# Patient Record
Sex: Female | Born: 1955
Health system: Southern US, Community
[De-identification: ages and names within clinical notes are randomized; demographics above are authoritative.]

## PROBLEM LIST (undated history)

## (undated) DIAGNOSIS — M549 Dorsalgia, unspecified: Secondary | ICD-10-CM

## (undated) DIAGNOSIS — E785 Hyperlipidemia, unspecified: Secondary | ICD-10-CM

## (undated) DIAGNOSIS — M542 Cervicalgia: Secondary | ICD-10-CM

## (undated) DIAGNOSIS — M1990 Unspecified osteoarthritis, unspecified site: Secondary | ICD-10-CM

## (undated) DIAGNOSIS — D649 Anemia, unspecified: Secondary | ICD-10-CM

## (undated) DIAGNOSIS — K449 Diaphragmatic hernia without obstruction or gangrene: Secondary | ICD-10-CM

## (undated) DIAGNOSIS — R0602 Shortness of breath: Secondary | ICD-10-CM

## (undated) DIAGNOSIS — R1310 Dysphagia, unspecified: Secondary | ICD-10-CM

## (undated) DIAGNOSIS — R12 Heartburn: Secondary | ICD-10-CM

## (undated) DIAGNOSIS — E119 Type 2 diabetes mellitus without complications: Secondary | ICD-10-CM

## (undated) DIAGNOSIS — J329 Chronic sinusitis, unspecified: Secondary | ICD-10-CM

## (undated) DIAGNOSIS — R5383 Other fatigue: Secondary | ICD-10-CM

## (undated) DIAGNOSIS — F419 Anxiety disorder, unspecified: Secondary | ICD-10-CM

## (undated) DIAGNOSIS — K219 Gastro-esophageal reflux disease without esophagitis: Secondary | ICD-10-CM

## (undated) DIAGNOSIS — E039 Hypothyroidism, unspecified: Secondary | ICD-10-CM

## (undated) DIAGNOSIS — G8929 Other chronic pain: Secondary | ICD-10-CM

## (undated) DIAGNOSIS — I10 Essential (primary) hypertension: Secondary | ICD-10-CM

## (undated) DIAGNOSIS — Z6841 Body Mass Index (BMI) 40.0 and over, adult: Secondary | ICD-10-CM

## (undated) DIAGNOSIS — Z8719 Personal history of other diseases of the digestive system: Secondary | ICD-10-CM

## (undated) DIAGNOSIS — R0600 Dyspnea, unspecified: Secondary | ICD-10-CM

## (undated) DIAGNOSIS — M2550 Pain in unspecified joint: Secondary | ICD-10-CM

## (undated) DIAGNOSIS — F329 Major depressive disorder, single episode, unspecified: Secondary | ICD-10-CM

## (undated) DIAGNOSIS — F32A Depression, unspecified: Secondary | ICD-10-CM

## (undated) DIAGNOSIS — K222 Esophageal obstruction: Secondary | ICD-10-CM

## (undated) DIAGNOSIS — R600 Localized edema: Secondary | ICD-10-CM

## (undated) DIAGNOSIS — M797 Fibromyalgia: Secondary | ICD-10-CM

## (undated) DIAGNOSIS — G4730 Sleep apnea, unspecified: Secondary | ICD-10-CM

## (undated) DIAGNOSIS — J45909 Unspecified asthma, uncomplicated: Secondary | ICD-10-CM

## (undated) DIAGNOSIS — K635 Polyp of colon: Secondary | ICD-10-CM

## (undated) HISTORY — DX: Dysphagia, unspecified: R13.10

## (undated) HISTORY — DX: Other fatigue: R53.83

## (undated) HISTORY — DX: Morbid (severe) obesity due to excess calories: E66.01

## (undated) HISTORY — DX: Shortness of breath: R06.02

## (undated) HISTORY — DX: Heartburn: R12

## (undated) HISTORY — PX: VAGINAL PROLAPSE REPAIR: SHX830

## (undated) HISTORY — DX: Anxiety disorder, unspecified: F41.9

## (undated) HISTORY — DX: Dorsalgia, unspecified: M54.9

## (undated) HISTORY — DX: Diaphragmatic hernia without obstruction or gangrene: K44.9

## (undated) HISTORY — PX: RECTOCELE REPAIR: SHX761

## (undated) HISTORY — DX: Chronic sinusitis, unspecified: J32.9

## (undated) HISTORY — DX: Unspecified asthma, uncomplicated: J45.909

## (undated) HISTORY — DX: Cervicalgia: M54.2

## (undated) HISTORY — DX: Other chronic pain: G89.29

## (undated) HISTORY — DX: Anemia, unspecified: D64.9

## (undated) HISTORY — DX: Polyp of colon: K63.5

## (undated) HISTORY — DX: Unspecified osteoarthritis, unspecified site: M19.90

## (undated) HISTORY — DX: Localized edema: R60.0

## (undated) HISTORY — DX: Fibromyalgia: M79.7

## (undated) HISTORY — DX: Essential (primary) hypertension: I10

## (undated) HISTORY — DX: Gastro-esophageal reflux disease without esophagitis: K21.9

## (undated) HISTORY — DX: Body Mass Index (BMI) 40.0 and over, adult: Z684

## (undated) HISTORY — DX: Pain in unspecified joint: M25.50

## (undated) HISTORY — DX: Esophageal obstruction: K22.2

## (undated) HISTORY — DX: Hyperlipidemia, unspecified: E78.5

## (undated) HISTORY — DX: Personal history of other diseases of the digestive system: Z87.19

## (undated) HISTORY — PX: ABDOMINAL HYSTERECTOMY: SHX81

---

## 2000-02-18 HISTORY — PX: ABDOMINAL HYSTERECTOMY: SHX81

## 2001-04-27 HISTORY — PX: KNEE ARTHROSCOPY: SUR90

## 2001-10-05 HISTORY — PX: TRANSTHORACIC ECHOCARDIOGRAM: SHX275

## 2001-10-05 HISTORY — PX: NM MYOCAR PERF WALL MOTION: HXRAD629

## 2002-08-17 ENCOUNTER — Ambulatory Visit (HOSPITAL_COMMUNITY): Admission: RE | Admit: 2002-08-17 | Discharge: 2002-08-17 | Payer: Self-pay | Admitting: Cardiovascular Disease

## 2002-08-17 ENCOUNTER — Encounter: Payer: Self-pay | Admitting: Cardiovascular Disease

## 2003-08-18 HISTORY — PX: CARDIAC CATHETERIZATION: SHX172

## 2004-02-07 ENCOUNTER — Ambulatory Visit (HOSPITAL_COMMUNITY): Admission: RE | Admit: 2004-02-07 | Discharge: 2004-02-07 | Payer: Self-pay | Admitting: Family Medicine

## 2004-02-28 ENCOUNTER — Ambulatory Visit (HOSPITAL_COMMUNITY): Admission: RE | Admit: 2004-02-28 | Discharge: 2004-02-28 | Payer: Self-pay | Admitting: Otolaryngology

## 2004-10-05 HISTORY — PX: COLONOSCOPY: SHX174

## 2005-04-09 ENCOUNTER — Ambulatory Visit (HOSPITAL_COMMUNITY): Admission: RE | Admit: 2005-04-09 | Discharge: 2005-04-09 | Payer: Self-pay | Admitting: Family Medicine

## 2005-05-06 ENCOUNTER — Encounter (INDEPENDENT_AMBULATORY_CARE_PROVIDER_SITE_OTHER): Payer: Self-pay | Admitting: General Surgery

## 2005-05-06 ENCOUNTER — Ambulatory Visit (HOSPITAL_COMMUNITY): Admission: RE | Admit: 2005-05-06 | Discharge: 2005-05-06 | Payer: Self-pay | Admitting: General Surgery

## 2005-07-10 ENCOUNTER — Ambulatory Visit (HOSPITAL_COMMUNITY): Admission: RE | Admit: 2005-07-10 | Discharge: 2005-07-10 | Payer: Self-pay | Admitting: Family Medicine

## 2006-04-27 ENCOUNTER — Observation Stay (HOSPITAL_COMMUNITY): Admission: EM | Admit: 2006-04-27 | Discharge: 2006-04-28 | Payer: Self-pay | Admitting: Emergency Medicine

## 2006-08-06 ENCOUNTER — Ambulatory Visit (HOSPITAL_COMMUNITY): Admission: RE | Admit: 2006-08-06 | Discharge: 2006-08-06 | Payer: Self-pay | Admitting: Internal Medicine

## 2007-01-02 ENCOUNTER — Emergency Department (HOSPITAL_COMMUNITY): Admission: EM | Admit: 2007-01-02 | Discharge: 2007-01-02 | Payer: Self-pay | Admitting: Emergency Medicine

## 2007-08-08 ENCOUNTER — Ambulatory Visit (HOSPITAL_COMMUNITY): Admission: RE | Admit: 2007-08-08 | Discharge: 2007-08-08 | Payer: Self-pay | Admitting: Family Medicine

## 2007-11-29 ENCOUNTER — Inpatient Hospital Stay (HOSPITAL_COMMUNITY): Admission: RE | Admit: 2007-11-29 | Discharge: 2007-11-30 | Payer: Self-pay | Admitting: Obstetrics and Gynecology

## 2008-06-19 ENCOUNTER — Ambulatory Visit: Payer: Self-pay | Admitting: Internal Medicine

## 2008-08-08 ENCOUNTER — Encounter: Admission: RE | Admit: 2008-08-08 | Discharge: 2008-08-08 | Payer: Self-pay | Admitting: Family Medicine

## 2008-08-22 ENCOUNTER — Ambulatory Visit: Payer: Self-pay | Admitting: Internal Medicine

## 2008-08-23 ENCOUNTER — Ambulatory Visit: Payer: Self-pay | Admitting: Internal Medicine

## 2008-08-23 ENCOUNTER — Ambulatory Visit (HOSPITAL_COMMUNITY): Admission: RE | Admit: 2008-08-23 | Discharge: 2008-08-23 | Payer: Self-pay | Admitting: Internal Medicine

## 2008-08-23 HISTORY — PX: ESOPHAGOGASTRODUODENOSCOPY: SHX1529

## 2008-09-25 ENCOUNTER — Ambulatory Visit: Payer: Self-pay | Admitting: Gastroenterology

## 2008-12-27 ENCOUNTER — Encounter: Admission: RE | Admit: 2008-12-27 | Discharge: 2008-12-27 | Payer: Self-pay | Admitting: *Deleted

## 2009-02-05 ENCOUNTER — Encounter: Payer: Self-pay | Admitting: Internal Medicine

## 2009-02-07 ENCOUNTER — Encounter: Payer: Self-pay | Admitting: Gastroenterology

## 2010-01-10 ENCOUNTER — Telehealth (INDEPENDENT_AMBULATORY_CARE_PROVIDER_SITE_OTHER): Payer: Self-pay

## 2010-01-10 ENCOUNTER — Encounter: Payer: Self-pay | Admitting: Gastroenterology

## 2010-01-21 ENCOUNTER — Encounter: Payer: Self-pay | Admitting: Gastroenterology

## 2010-01-21 ENCOUNTER — Ambulatory Visit: Payer: Self-pay | Admitting: Gastroenterology

## 2010-01-21 DIAGNOSIS — K219 Gastro-esophageal reflux disease without esophagitis: Secondary | ICD-10-CM

## 2010-01-21 DIAGNOSIS — R1319 Other dysphagia: Secondary | ICD-10-CM

## 2010-03-17 ENCOUNTER — Telehealth (INDEPENDENT_AMBULATORY_CARE_PROVIDER_SITE_OTHER): Payer: Self-pay

## 2010-07-23 ENCOUNTER — Encounter (INDEPENDENT_AMBULATORY_CARE_PROVIDER_SITE_OTHER): Payer: Self-pay | Admitting: *Deleted

## 2010-10-25 ENCOUNTER — Encounter: Payer: Self-pay | Admitting: Family Medicine

## 2010-10-29 ENCOUNTER — Ambulatory Visit (HOSPITAL_COMMUNITY)
Admission: RE | Admit: 2010-10-29 | Discharge: 2010-10-29 | Payer: Self-pay | Source: Home / Self Care | Attending: Family Medicine | Admitting: Family Medicine

## 2010-11-04 NOTE — Letter (Signed)
Summary: Internal Other /Rx fax [facility]  Internal Other /Rx fax [facility]   Imported By: Cloria Spring LPN 78/46/9629 52:84:13  _____________________________________________________________________  External Attachment:    Type:   Image     Comment:   External Document

## 2010-11-04 NOTE — Progress Notes (Signed)
Summary: phone message/ nexium not working  Phone Note Call from Patient   Caller: Patient Summary of Call: Pt called and said she is on Nexium now and it is not working well, she has alot of burning. She was doing well on Zegerid before they started giving generic. Please advise. ( [facility] in [place]) Initial call taken by: Cloria Spring LPN,  March 17, 2010 11:38 AM     Appended Document: phone message/ nexium not working    Prescriptions: DEXILANT 60 MG CPDR (DEXLANSOPRAZOLE) one by mouth 30 mins before breakfast daily  #30 x 11   Entered and Authorized by:   Leanna Battles. Dixon Boos   Signed by:   Leanna Battles Lewis PA-C on 03/17/2010   Method used:   Electronically to        [facility] [phone]* (retail)       [address]       [place], [place]  [address]       Ph: [phone]       Fax: [phone]   RxID:   [id number]    Change to Dexilant. Please make sure she knows it is only taken once daily because it is "true" 24 hour med with release of medication twice during the day.  Most people do well with only once daily dosing.    Appended Document: phone message/ nexium not working Pt informed.

## 2010-11-04 NOTE — Letter (Signed)
Summary: Recall Office Visit  [facility]  [address]   [place], [place] [address]   Phone: [phone]  Fax: [phone]      July 23, 2010   [facility] [address] [place], [place]  [address] 1956/02/15   Dear Ms. Saxe,   According to our records, it is time for you to schedule a follow-up office visit with [name].   At your convenience, please call [phone] to schedule an office visit. If you have any questions, concerns, or feel that this letter is in error, we would appreciate your call.   Sincerely,    Diana Eves  [facility] Ph: [phone]   Fax: [phone]

## 2010-11-04 NOTE — Medication Information (Signed)
Summary: Tax adviser   Imported By: Diana Eves 01/10/2010 08:52:13  _____________________________________________________________________  External Attachment:    Type:   Image     Comment:   External Document  Appended Document: RX Folder-ZEGERID    Prescriptions: ZEGERID 40-1100 MG CAPS (OMEPRAZOLE-SODIUM BICARBONATE) one by mouth bid  #60 x 5   Entered and Authorized by:   Leanna Battles. Dixon Boos   Signed by:   Leanna Battles Dixon Boos on 01/10/2010   Method used:   Electronically to        [facility] [phone]* (retail)       [address]       [place], [place]  [address]       Ph: [phone]       Fax: [phone]   RxID:   [id number]

## 2010-11-04 NOTE — Progress Notes (Signed)
Summary: Pt requesting refills on Zegerid/ Please see Rx folder  Phone Note Call from Patient   Caller: Patient Summary of Call: Pt called about her Zegerid. [facility]red [facility] to [facility] [address] in [place].(Told her the Rx request is in refill box) Initial call taken by: Cloria Spring LPN,  January 10, 2010 11:11 AM

## 2010-11-04 NOTE — Assessment & Plan Note (Signed)
Summary: e30,abd pain.gu   Visit Type:  Follow-up Visit Primary Care Provider:  Phillips Odor  Chief Complaint:  Abdominal pain.  History of Present Illness: [name] is a pleasant 55 year old female who presents today for further evaluation of abdominal pain. Last saw her in December 2009. She has a history of erosive reflux esophagitis. Her last EGD was in November 2009. She states that she was doing well up until her Zegerid was switched to generic. She noted an almost immediately difference in the control of her heartburn. It is gradually getting worse in the last several months. She has more epigastric burning, bloating, heartburn. Now she feels like food is sticking when she swallows. At nighttime when she lies down she feels like food is going to come back up. She tries not to eat too late at night. She is watching her caffeine intake. Her bowel movements are regular. Not in any blood in the stool or melena.    Current Medications (verified): 1)  Zegerid 40-1100 Mg Caps (Omeprazole-Sodium Bicarbonate) .... One By Mouth Bid 2)  Welchol .... 625mg  Three Times A Day 3)  Estradiol .... 0.5mg  Daily 4)  Wellbutrin .Marland KitchenMarland KitchenMarland Kitchen 150 Mg Daily 5)  Levocetirizine Dihydrochloride 5 Mg Tabs (Levocetirizine Dihydrochloride) .... Once Daily 6)  Aspir-Low 81 Mg Tbec (Aspirin) .... Once Daily 7)  Co Q 10 120 Mg .... Once Daily 8)  Cyclobenzaprine Hcl 10 Mg Tabs (Cyclobenzaprine Hcl) .... Once Daily 9)  Hydrocodone-Acetaminophen 2.5-500 Mg Tabs (Hydrocodone-Acetaminophen) .... As Needed 10)  Lyrica 50 Mg Caps (Pregabalin) .... Three Times A Day 11)  Fish Oil 1000 Mg Caps (Omega-3 Fatty Acids) .... Once Daily 12)  Multivitamins  Tabs (Multiple Vitamin) .... Once Daily 13)  Vitamin D2 .... Q Week 14)  Tribenzor 40-5-12.5 Mg Tabs (Olmesartan-Amlodipine-Hctz) .... Once Daily  Allergies (verified): No Known Drug Allergies  Past History:  Past Medical History: GERD EGD, 11/09, Dr. Jena Gauss --> mild erosive  reflux esophagitis Allergies Constipation Anxiety Disorder Hyperlipidemia Hypertension TCS, 8/06, Dr. Franky Macho --> small hyperplastic polyp EGD, 8/06, Dr. Franky Macho --> hiatal hernia. CLO test positive, treated for H. Pylori Chronic neck pain, ?arthritis, fibromyalgia  Past Surgical History: Knee Arthroscopy, Left, twice Cardiac Cath Vaginal prolapse Rectocele repair  Family History: Father, kidney cancer No FH CRC, colon polyps Aunt, stomach cancer Grandmother, breast cancer  Social History: Married. Four children. Taking care of grandchild. Quit tob use 16 years ago. No alcohol. No drugs.   Review of Systems      See HPI General:  weight up 10 pounds in one year. CV:  Denies chest pains, angina, and dyspnea on exertion. Resp:  Denies dyspnea at rest, dyspnea with exercise, and cough. GU:  Denies urinary burning and blood in urine.  Vital Signs:  Patient profile:   55 year old female Height:      68 inches Weight:      288 pounds BMI:     43.95 Temp:     98.7 degrees F oral Pulse rate:   76 / minute BP sitting:   120 / 80  (left arm) Cuff size:   large  Vitals Entered By: Cloria Spring LPN (January 21, 2010 10:37 AM)  Physical Exam  General:  Well developed, well nourished, no acute distress.obese.   Head:  Normocephalic and atraumatic. Eyes:  Conjunctivae pink, no scleral icterus.  Mouth:  OP moist. Lungs:  Clear throughout to auscultation. Heart:  Regular rate and rhythm; no murmurs, rubs,  or bruits. Abdomen:  Bowel sounds normal.  Abdomen is soft, nontender, nondistended.  No rebound or guarding.  No hepatosplenomegaly, masses or hernias.  No abdominal bruits. obese.   Extremities:  No clubbing, cyanosis, edema or deformities noted. Neurologic:  Alert and  oriented x4;  grossly normal neurologically. Skin:  Intact without significant lesions or rashes. Psych:  Alert and cooperative. Normal mood and affect.  Impression & Recommendations:  Problem  # 1:  GERD (ICD-530.81)  Refractory symptoms on generic Zegerid b.i.d. Will switch her to different PPI to see if her symptoms can be controlled. Start Nexium 40 mg 30 minutes before breakfast and before evening meal daily, #30 samples and prescription provided. She will call the progress report in few weeks. Suspect mild dysphagia due to refractory GERD. OV in 6 months with Dr. Jena Gauss for f/u.  Orders: Est. Patient Level III ([address])  Patient Instructions: 1)  Stop Zegerid. 2)  Begin Nexium twice per day. 3)  Call with progress report in 3-4 weeks.  4)  Please schedule a follow-up appointment in 6 months with Dr. Jena Gauss 5)  Avoid foods high in acid content ( tomatoes, citrus juices, spicy foods) . Avoid eating within 3 to 4 hours of lying down or before exercising. Do not over eat; try smaller more frequent meals.   6)  The medication list was reviewed and reconciled.  All changed / newly prescribed medications were explained.  A complete medication list was provided to the patient / caregiver. Prescriptions: NEXIUM 40 MG CPDR (ESOMEPRAZOLE MAGNESIUM) one by mouth 30 mins before breakfast and 30 mins before evening meal  #60 x 5   Entered and Authorized by:   Leanna Battles. Dixon Boos   Signed by:   Leanna Battles Suprena Travaglini PA-C on 01/21/2010   Method used:   Printed then faxed to ...         RxID:   [id number]   Appended Document: e30,abd pain.gu reminder in computer

## 2010-12-05 ENCOUNTER — Encounter: Payer: Self-pay | Admitting: Gastroenterology

## 2010-12-16 ENCOUNTER — Ambulatory Visit (INDEPENDENT_AMBULATORY_CARE_PROVIDER_SITE_OTHER): Payer: Medicare HMO | Admitting: Gastroenterology

## 2010-12-16 ENCOUNTER — Encounter: Payer: Self-pay | Admitting: Gastroenterology

## 2010-12-16 DIAGNOSIS — R1319 Other dysphagia: Secondary | ICD-10-CM

## 2010-12-16 DIAGNOSIS — K219 Gastro-esophageal reflux disease without esophagitis: Secondary | ICD-10-CM

## 2010-12-23 NOTE — Assessment & Plan Note (Signed)
Summary: burning in stomach   Vital Signs:  Patient profile:   55 year old female Height:      68.5 inches Weight:      289 pounds BMI:     43.46 Temp:     98.3 degrees F oral Pulse rate:   64 / minute BP sitting:   126 / 84  (left arm)  Vitals Entered By: Carolan Clines LPN (December 16, 2010 9:57 AM)  Visit Type:  f/u Primary Care Provider:  Dr. Assunta Found  Chief Complaint:  abdominal pain.  History of Present Illness: Robin [name] is here for f/u abd burning type pain. Last seen in 4/11. She c/o lots of epigstric burning, heartburn, especially at night. Really out of control for past several months. Sleeps reclined. Some dysphagia. Feels very full pp. BM mostly once daily. Feels like doesn't empty with BM. No brbpr or melena. Had been off Mobic but recently started back on it the last two weeks.   She has a history of erosive reflux esophagitis. Her last EGD was in November 2009. She previously did well on name brand Zegerid but has continued to have breakthrough symptoms on generic Zegerid. Previously took six weeks worth of Dexilant but was unable to afford copay. Never used rebate card. Thinks Dexilant helped.  Failed Nexium.  Current Medications (verified): 1)  Welchol 625 Mg Tabs (Colesevelam Hcl) .... Take One Two Times A Day 2)  Estradiol 0.5 Mg Tabs (Estradiol) .... Take One Once Daily 3)  Wellbutrin Xl 300 Mg Xr24h-Tab (Bupropion Hcl) .... Take One Once Daily 4)  Levocetirizine Dihydrochloride 5 Mg Tabs (Levocetirizine Dihydrochloride) .... Once Daily 5)  Aspir-Low 81 Mg Tbec (Aspirin) .... Once Daily 6)  Co Q-10 100 Mg Caps (Coenzyme Q10) .... Take One Once Daily 7)  Lyrica 50 Mg Caps (Pregabalin) .... Three Times A Day 8)  Fish Oil 1000 Mg Caps (Omega-3 Fatty Acids) .... Once Daily 9)  Multivitamins  Tabs (Multiple Vitamin) .... Once Daily 10)  Dexilant 60 Mg Cpdr (Dexlansoprazole) .... One By Mouth 30 Mins Before Breakfast Daily 11)  Meloxicam 15 Mg Tabs (Meloxicam) .... Take  One Once Daily 12)  Benicar 40 Mg Tabs (Olmesartan Medoxomil) .... Take One Once Daily  Allergies (verified): No Known Drug Allergies  Review of Systems      See HPI  Physical Exam  General:  obese.   Head:  Normocephalic and atraumatic. Eyes:  sclera nonicteric Mouth:  op moist Abdomen:  Bowel sounds normal.  Abdomen is soft, mild epig tenderness, nondistended.  No rebound or guarding.  No hepatosplenomegaly, masses or hernias.  No abdominal bruits.  Extremities:  No clubbing, cyanosis, edema or deformities noted. Neurologic:  Alert and  oriented x4;  grossly normal neurologically. Skin:  Intact without significant lesions or rashes. Psych:  Alert and cooperative. Normal mood and affect.   Impression & Recommendations:  Problem # 1:  GERD (ICD-530.81)  Several month h/o refractory GERD. Dysphagia likely secondary to refractory GERD. Try Dexilant. She has samples. Rebate card given. PR in one month. If no improvement, consider EGD.   Orders: Est. Patient Level II ([address]) Prescriptions: DEXILANT 60 MG CPDR (DEXLANSOPRAZOLE) one by mouth 30 mins before breakfast daily  #30 x 11   Entered and Authorized by:   Leanna Battles. Dixon Boos   Signed by:   Leanna Battles Dixon Boos on 12/16/2010   Method used:   Electronically to        [facility]  [place] (retail)       [address]       [place], [place]  [address]       Ph: [phone]       Fax: [phone]   RxID:   [id number]    Orders Added: 1)  Est. Patient Level II [[address]]

## 2011-01-22 ENCOUNTER — Other Ambulatory Visit (HOSPITAL_COMMUNITY): Payer: Self-pay | Admitting: Family Medicine

## 2011-01-22 DIAGNOSIS — R52 Pain, unspecified: Secondary | ICD-10-CM

## 2011-01-26 ENCOUNTER — Other Ambulatory Visit (HOSPITAL_COMMUNITY): Payer: Medicare HMO

## 2011-02-13 ENCOUNTER — Ambulatory Visit (HOSPITAL_COMMUNITY): Payer: Managed Care, Other (non HMO)

## 2011-02-17 NOTE — H&P (Signed)
Robin [name], Robin [name]                ACCOUNT NO.:  [id number]   MEDICAL RECORD NO.:  [id number]          PATIENT TYPE:  AMB   LOCATION:  DAY                           FACILITY:  [facility]   PHYSICIAN:  R. Roetta Sessions, M.D. DATE OF BIRTH:  1956-05-18   DATE OF ADMISSION:  DATE OF DISCHARGE:  LH                              HISTORY & PHYSICAL   CHIEF COMPLAINT:  Ongoing refractory reflux symptoms, esophageal  dysphagia.   Robin [name] is a very pleasant obese 55 year old lady we saw in  consultation at the request of Dr. Phillips Odor on June 19, 2008.  She  was having problems with some difficulties with constipation over the  past 2 years and also has chronic reflux symptoms, and  complains  bitterly today of epigastric and retrosternal burning and difficulty  swallowing pills on a regular basis.  She has been on a multitude of  acid suppressing agents.  She states Zegerid really worked better than  anything else she has taken, although she took it only briefly because  of cost.  She is currently taking Prilosec 20 mg orally b.i.d., and this  is barely blunting her reflux symptoms.  She has not had any  hematemesis, melena or hematochezia.  We had recommended that she switch  from Metamucil to Benefiber, and take this agent every day.  She is only  taking Benefiber sporadically citing cost issues on taking of fiber  supplement 1-2 times weekly.  When she takes it, it seems to help, but  she ends up getting constipated by the end of the week.  We have  reviewed the old records.  She did have an EGD and colonoscopy by Dr.  Lovell Sheehan back in 2006.  He reported basically a small hiatal hernia and  CLO test was positive on EGD, and she does describe taking antibiotic  therapy for H. pylori infection.  Colonoscopy demonstrated a small polyp  in the hepatic flexure which was cold biopsied which turned out to be  hyperplastic.  No other abnormalities were reported.   PAST MEDICAL HISTORY:  1.  Allergies.  2. Anxiety.  3. Hypercholesterolemia.  4. GERD.  5. Hypertension.  6. Constipation.  7. Left knee arthroscopy.  8. Cardiac cath.  9. Vaginal prolapse.  10.Rectocele surgery.   CURRENT MEDICATIONS:  1. Welchol 625 mg t.i.d.  2. Estradiol 0.5 mg daily.  3. Benicar 40/12.5 mg daily.  4. Wellbutrin 150 mg daily.  5. Xyzal 5 mg daily.  6. Estrace vaginal cream p.r.n.  7. Prilosec 20 mg orally b.i.d.  8. Benefiber occasionally.   ALLERGIES:  No known drug allergies.   FAMILY HISTORY:  Father had kidney cancer.  No history of GI malignancy.   SOCIAL HISTORY:  Patient is married.  She is unemployed.  Quit smoking  15 years ago.  No alcohol or illicit drugs.   REVIEW OF SYSTEMS:  As in history of present illness.  Has not had any  night sweats, chest pain, fever, chills.   PHYSICAL EXAMINATION:  A pleasant 55-year lady resting comfortably.  Weight 280 which is up 12  pounds since her September 2009 visit with [name],  temp 97.8, BP 112/72, pulse 68.  SKIN:  Warm and dry.  CHEST:  Lungs are clear to auscultation.  CARDIOVASCULAR:  Regular rate and rhythm without murmur, gallop or rub.  ABDOMEN:  Obese, positive bowel sounds, soft, nontender without  appreciable mass or organomegaly.  EXTREMITIES:  Trace lower extremity edema.   IMPRESSION:  Ms. Jakeria Caissie. [name] is a pleasant obese 55 year old lady  with worsening gastroesophageal reflux disease symptoms, and describes  esophageal dysphagia now to primarily pills.  She does note that she had  an excellent response to Zegerid, and failed multiple other proton pump  inhibitors in the past.   Her refractory symptoms and dysphagia warrant endoscopic evaluation at  this time.  She also has altered bowel habits and symptoms of  constipation in a setting of taking fiber supplementation only  sporadically.  She really does not have any alarm features.  It is good  to know she had a colonoscopy 3 years ago with the  above-mentioned  findings.   RECOMMENDATIONS:  Diagnostic EGD in the near future.  Potential for  esophageal dilation reviewed with Robin [name].  Risks, benefits,  alternatives, and limitations have been discussed.  Her questions are  answered.  She is agreeable.  Will make further recommendations after  endoscopic evaluation has taken place.   This is a separate issue.  I have strongly recommended that she take  Benefiber, a heaping tablespoon every day, and I stressed the importance  of taking Benefiber every day as taking it even on her good days will  diminish the chances of her having a bad next day.      Jonathon Bellows, M.D.  Electronically Signed     RMR/MEDQ  D:  08/22/2008  T:  08/22/2008  Job:  [id number]   cc:   Phillips Odor, M.D.

## 2011-02-17 NOTE — H&P (Signed)
NAME:  Robin [name], Robin [name]                ACCOUNT NO.:  [id number]   MEDICAL RECORD NO.:  [id number]          PATIENT TYPE:  AMB   LOCATION:                                FACILITY:  [facility]   PHYSICIAN:  Randye Lobo, M.D.   DATE OF BIRTH:  December 05, 1955   DATE OF ADMISSION:  DATE OF DISCHARGE:                              HISTORY & PHYSICAL   CHIEF COMPLAINT:  Urinary incontinence.   HISTORY OF PRESENT ILLNESS:  The patient is a 55 year old, para 4,  African-American female, status post total abdominal hysterectomy 9  years ago for uterine fibroids, who presents with urinary incontinence.  The patient reports leakage of urine with laughing, coughing and  sneezing.  The patient would like surgical treatment of her urinary  incontinence, and she, therefore, underwent multichannel urodynamic  testing in the office which confirmed the presence of genuine stress  incontinence with a leak point pressure of 105 cm of water.  On the  patient's pressure flow study, she had a maximum detrusor pressure of 38  cm of water.   PAST OBSTETRIC AND GYNECOLOGIC HISTORY:  Remarkable for four vaginal  deliveries.  The patient is status post laparoscopy for infertility  several years ago.  The patient's last Pap smear was performed in  December 2007 and was within normal limits.  Her last mammogram was  performed in November 2008 and was within normal limits.  The patient is  not taking any hormone replacement therapy.  She has been using Premarin  vaginal cream 2 grams per vagina twice a week.   PAST MEDICAL HISTORY.:  1. Hypertension.  2. Osteoarthritis.  3. Environmental allergies.  4. Gastroesophageal reflux disease.  5. Jehovah's Witness candidate.  The patient currently declines      transfusion with human blood products.   PAST SURGICAL HISTORY:  1. Status post total abdominal hysterectomy.  2. Status post knee surgeries.   MEDICATIONS:  1. Benicar 40/12.5 mg.  2. Bupropion 150 mg.  3.  Fexofenadine 180 mg.  4. Zegerid 20 mg.   ALLERGIES:  No known drug allergies.   SOCIAL HISTORY:  The patient is married for 29 years.  She denies the  use of tobacco, alcohol, or illicit drugs.   FAMILY HISTORY:  Negative for cancer of the breast, ovary, uterus, or  colon. Positive for hypertension in the patient's mother and father and  positive for hyperlipidemia of the patient's mother and father.   REVIEW OF SYSTEMS:  The patient reports insomnia of 6 weeks' duration.  The patient also is experiencing constipation symptoms.   PHYSICAL EXAMINATION:  VITAL SIGNS:  Height is 5 feet 8 inches.  Weight  is 268 pounds. Blood pressure 136/84.  HEENT:  Normocephalic, atraumatic.  LUNGS:  Clear to auscultation bilaterally.  HEART:  S1, S2 with a regular rate and rhythm.  ABDOMEN:  Soft and nontender without evidence of hepatosplenomegaly or  organomegaly.  PELVIC:  Normal external genitalia and urethra.  There is a second-  degree cystocele and a first-degree rectocele.  The vaginal apex is well  supported.  The cervix  is absent.  There is no evidence of any midline  nor adnexal mass or tenderness.   IMPRESSION:  The patient is a 55 year old, para 4, female, status post  total abdominal hysterectomy, who has urodynamically proven genuine  stress incontinence and evidence of incomplete vaginal prolapse.   The patient is a TEFL teacher Witness candidate.   PLAN:  The patient will undergo a tension-free vaginal tape mid urethral  sling, cystoscopy, and anterior and posterior colporrhaphy at the  [facility] on November 29, 2007.  Risks, benefits,  and alternatives have been discussed with the patient who wishes to  proceed.      Randye Lobo, M.D.  Electronically Signed     BES/MEDQ  D:  11/28/2007  T:  11/28/2007  Job:  [id number]

## 2011-02-17 NOTE — Op Note (Signed)
Robin [name], Robin [name]                ACCOUNT NO.:  [id number]   MEDICAL RECORD NO.:  [id number]          PATIENT TYPE:  INP   LOCATION:  9312                          FACILITY:  [facility]   PHYSICIAN:  Randye Lobo, M.D.   DATE OF BIRTH:  12-30-1955   DATE OF PROCEDURE:  11/29/2007  DATE OF DISCHARGE:                               OPERATIVE REPORT   PREOPERATIVE DIAGNOSES:  1. Genuine stress incontinence.  2. Incomplete vaginal prolapse.   POSTOPERATIVE DIAGNOSES:  1. Genuine stress incontinence.  2. Incomplete vaginal prolapse.   PROCEDURE:  1. Tension-free vaginal tape mid urethral sling.  2. Cystoscopy.  3. Anterior and posterior colporrhaphy.   SURGEON:  Randye Lobo, M.D.   ASSISTANT:  Luvenia Redden, M.D.   ANESTHESIA:  General endotracheal.   IV FLUIDS:  2000 mL Ringer's lactate.   ESTIMATED BLOOD LOSS:  150 mL.   URINE OUTPUT:  700 mL.   COMPLICATIONS:  None.   INDICATIONS FOR PROCEDURE:  The patient is a 55 year old para 4 African  American female, status post total abdominal hysterectomy several years  prior, who presented with urinary incontinence with stressful maneuvers.  The patient wished for surgical treatment.  Multichannel urodynamic  testing in the office did confirm the presence of genuine stress  incontinence, with a leak point pressure 105 cm of water.  On pelvic  examination the patient was also noted to have a second-degree cystocele  and a first-degree rectocele.  A plan was therefore made to proceed with  a tension-free vaginal tape mid urethral sling, cystoscopy, and anterior  and posterior colporrhaphy -- after the risks, benefits, and  alternatives were reviewed.   FINDINGS:  Examination under anesthesia did reveal a second-degree  cystocele and a first degree high rectocele.  The cervix was surgically  absent.   Cystoscopy during the placement of the sling demonstrated the absence of  a foreign body in either the urethra or the bladder.   The bladder was  visualized throughout 360 degrees and had a normal bladder dome and  trigone.  Both of the ureters were noted to be patent after the  injection of indigo carmine dye IV.   SPECIMENS:  None.   PROCEDURE:  The patient was reidentified in the preoperative hold area.  She did receive cefotetan 1 gram IV for antibiotic prophylaxis, and she  received both PAS stockings and TED hose for DVT prophylaxis.   In the operating room, general endotracheal anesthesia was induced.  The  patient was placed in the dorsal lithotomy position, and the lower  abdomen, vagina and perineum were sterilely prepped and draped.  A Foley  catheter was sterilely placed inside the bladder.   An examination of the patient was performed and the findings are as  noted above.  A weighted speculum was placed in the vagina and Allis  clamps were placed on the anterior vaginal wall mucosa in the midline.  The vaginal mucosa was injected with 0.5% lidocaine with 1:200,000 of  epinephrine.  The vaginal mucosa was incised vertically with a scalpel.  A combination of sharp  and blunt dissection were used to dissect the  subvaginal tissue and bladder off of the overlying vaginal mucosa  bilaterally.  The dissection was carried to the level of the pubic rami  into the level of the vaginal cuff.  Hemostasis throughout the  dissection was achieved with monopolar cautery.   The 1 cm suprapubic incisions were then created 2 cm to the right and to  the left of the midline using a scalpel.  The sling procedure was  performed in a top-down fashion.  The abdominal needle passer was placed  through the right suprapubic incision,  and then out through the vaginal  incision at the level of the mid urethra and lateral to this on the  ipsilateral side.  The same procedure that was performed on the right-  hand side was then repeated on the left-hand side.  The Foley catheter was removed at this time and cystoscopy was   performed; the findings are as noted above.  The bladder was then  drained of the cystoscopy fluid.  The Foley catheter was placed in the  bladder.  The sling was attached to the abdominal needle passers, and  the sling was drawn up through the suprapubic incisions bilaterally.  A  Kelly clamp was placed between the sling and the urethra as the plastic  sheaths were removed.  The sling was noted to be in excellent position.  Excess sling material was trimmed.   The anterior colporrhaphy was performed with a series of vertical  mattress sutures of 2-0 Vicryl for excellent reduction of the cystocele.  Excess vaginal mucosa was trimmed and the anterior vaginal wall was  closed with a running locked suture of 2-0 Vicryl.   The posterior colporrhaphy was performed next.  Allis clamps were used  to mark the vaginal introitus and the posterior vaginal wall in the  midline.  The perineal body and the posterior vaginal mucosa were then  injected with 0.5% lidocaine with 1:200,000 of epinephrine.  A  triangular wedge of epithelium was removed from the perineal body.  The  vagina was then incised vertically in the midline with the Metzenbaum  scissors.  The perirectal fascia was dissected away from the overlying  vaginal mucosa superiorly and inferiorly.  The patient was noted to have  a high rectocele.  The posterior colporrhaphy was reduced by placing a  series of both mattress sutures and a site-specific repair of defects in  the perirectal fascia, primarily using 2-0 Vicryl suture.  As the  colporrhaphy reached the level of the perineal body, this was  transitioned to vertical mattress sutures of 0 Vicryl.  Excess vaginal  mucosa was trimmed.  The posterior vaginal wall was closed with a  running locked suture of 2-0 Vicryl, which transitioned onto the  perineal body in a subcuticular fashion.   Final rectal exam confirmed the absence of sutures in the rectum.  A  gauze packing with Estrace  was placed in the vagina.   The suprapubic incisions were closed with Dermabond.   The Foley catheter was left to gravity drainage.   The patient was cleansed of remaining Betadine and taken out of the  dorsal lithotomy position.  She was extubated and escorted to the  recovery room in stable and awake condition.  There were no  complications to the procedure.  All needle, instrument and sponge  counts were correct.      Randye Lobo, M.D.  Electronically Signed     BES/MEDQ  D:  11/29/2007  T:  11/30/2007  Job:  [id number]   cc:   Corrie Mckusick, M.D.  Fax: [phone]

## 2011-02-17 NOTE — Assessment & Plan Note (Signed)
NAMEFORESTINE, MACHO                 CHART#:  [id number]   DATE:  09/25/2008                       DOB:  30-May-1956   CHIEF COMPLAINT:  Acid reflux and constipation.   SUBJECTIVE:  The patient is here for a followup.  She has a history of  chronic GERD and mild erosive reflux esophagitis at the time of EGD on  August 23, 2008.  She has been off and on PPI therapy, but  predominantly because of cost, she is not able to take it for any  extended period of time.  Reflux symptoms have responded best to  Zegerid, but she tells me she continues to have ongoing breakthrough  symptoms.  She has been taking Zegerid daily for the last 30 days, but  the last dose was 2 days ago when she ran out.  She continues to have  heartburn throughout the day.  Does not seem to be related to any  particular foods.  She does have some regurgitation.  She denies any  dysphagia or abdominal pain.  Her bowel movements are better on  Benefiber.  She is having regular stools.  Denies any blood in the stool  or melena.  Her weight is stable.   CURRENT MEDICATIONS:  1. WelChol 625 mg 3 twice a day.  2. Estradiol 0.5 mg daily.  3. Benicar 40/12.5 mg daily.  4. Wellbutrin 150 mg daily.  5. Estrace vaginal cream p.r.n.  6. Zegerid 40 mg daily.  7. Benefiber t.i.d.  8. She is on a decongestant and Augmentin for 10 days because of      sinusitis.   ALLERGIES:  No known drug allergies.   PHYSICAL EXAMINATION:  VITAL SIGNS:  Weight 279 and stable, temp 98.2,  blood pressure 110/76, and pulse 80.  GENERAL:  Pleasant, obese female in no acute distress.  SKIN:  Warm and dry.  No jaundice.  HEENT:  Sclerae nonicteric.  Oropharyngeal mucosa moist and pink.  ABDOMEN:  Positive bowel sounds.  Soft, nontender, and nondistended.  No  organomegaly or masses.  No rebound or guarding.  No abdominal bruits or  hernias.   IMPRESSION:  The patient is a pleasant 55 year old obese lady who has  refractory gastroesophageal  reflux disease symptoms.  Her dysphagia is  better.  Her constipation is better on Benefiber.  She basically has  failed Prilosec 20 mg b.i.d., Nexium, Protonix, Aciphex, and Zegerid  once daily.  Zegerid worked the best and Kapidex worked probably the  second best for her, but she continues to have breakthrough symptoms on  both of these medications.   PLAN:  1. She likely will need b.i.d. PPI therapy.  The tricky part is      getting the medication for her.  She states her copay currently is      about 50 dollars for the Zegerid.  I did provide her with a rebate      card, which should reduce her copay to 25 dollars, but I am not      sure if this is going to be for once daily only or if they would      apply to twice daily Zegerid.  I have written a prescription for      both and asked her to go ahead and get whichever one she  can get      today and we would be able to provide her samples if needed when we      receive some.  We did give her some Kapidex samples, #30 with a      goal of taking a PPI twice daily for whichever combination is most      cost efficient for her.  We also discussed antireflux measures and      provided literature today.  2. She will call in 2-3 weeks with her progress report and she may      call periodically for Zegerid samples if needed.       Tana Coast, P.A.  Electronically Signed     Kassie Mends, M.D.  Electronically Signed    LL/MEDQ  D:  09/25/2008  T:  09/26/2008  Job:  [id number]   cc:   Corrie Mckusick, M.D.

## 2011-02-17 NOTE — Telephone Encounter (Signed)
Robin, [name]                 CHART#:  [id number]   DATE:  06/19/2008                       DOB:  Feb 01, 1956   REASON FOR CONSULTATION:  Change in bowel movements and constipation.   PHYSICIAN REQUESTING CONSULTATION:  Corrie Mckusick, MD   HISTORY OF PRESENT ILLNESS:  The patient is a very pleasant 55 year old  lady who presents today at the request of Dr. Phillips [name] for further  evaluation of change in bowel movements and constipation.  The patient  states that she has noted significant change in her stools for about 2  years.  Initially, she thought it was related to the fact that her  bladder and rectum had fallen and that she needed repair.  She has since  undergone a cystocele and rectocele in February 2009.  She states her  bowel function really has not improved.  She has been taking Metamucil  about 2-3 times a week.  She complains of lot of bloating.  She states  her stools are softer, but she never feels like she gets good evacuation  and or gets emptied.  She generally has a small bowel movement daily.  She complains mostly left lower quadrant abdominal pain.  She has some  associated nausea, but no vomiting.  She does complain of chronic  heartburn.  She has been on multiple PPIs which have been switched  primarily because of expense.  She has been on Zegerid, Nexium,  Protonix, and Aciphex.  Currently, she is on Kapidex, samples that were  given to her.  She also has some vague intermittent solid food  dysphagia.  Denies any odynophagia.  No weight loss.  She reports having  a EGD and colonoscopy by Dr. Lovell Sheehan about 4 years ago.  We are trying  to get those records.   CURRENT MEDICATIONS:  1. WelChol 625 mg 3 tablets twice daily.  2. Estradiol 0.5 mg daily.  3. Benicar 40/12.5 mg daily.  4. Wellbutrin 150 mg daily.  5. Xyzal 5 mg daily.  6. Kapidex 60 mg daily.  7. Estrace cream 1 g twice weekly per vagina.   ALLERGIES:  No known drug allergies.   PAST  MEDICAL HISTORY:  Allergies, anxiety, hypercholesterolemia, GERD,  hypertension, constipation, left knee arthroscopy, prior  catheterization, prior EGD and colonoscopy as above.  February 2009, she  had surgery for vaginal prolapse and potentially a rectocele repair.   FAMILY HISTORY:  Father had kidney cancer.  He is currently in  remission.  He is 81.  No family history of colorectal cancer to her  knowledge.   SOCIAL HISTORY:  She is married.  She is unemployed.  She quit smoking  15 years ago.  No alcohol use.   REVIEW OF SYSTEMS:  See HPI for GI.  Constitutional:  Denies any weight  loss.  Cardiopulmonary:  Denies chest pain, shortness of breath,  palpitations, or cough.  Genitourinary:  Denies dysuria or hematuria.   PHYSICAL EXAMINATION:  VITAL SIGNS:  Weight 268, height 5 feet 9 inches,  temp 97.9, blood pressure 120/70, and pulse 64.  GENERAL:  A pleasant, obese, black female in no acute distress.  SKIN:  Warm and dry.  No jaundice.  HEENT:  Sclerae nonicteric.  Oropharyngeal mucosa moist and pink.  No  lesions, erythema, or exudate.  No lymphadenopathy  or thyromegaly.  CHEST:  Lungs are clear to auscultation.  CARDIAC:  Regular rate and rhythm.  Normal S1 and S2.  No murmurs, rubs,  or gallops.  ABDOMEN:  Positive bowel sounds.  Abdomen is obese and soft.  She has  mild left lower quadrant tenderness to deep palpation.  No rebound or  guarding.  No organomegaly or masses.  No abdominal bruits or hernias.  LOWER EXTREMITIES:  No edema.   IMPRESSION:  The patient is a 55 year old lady who has had a change in  bowel movements with increasing difficulty due to constipation in the  last 2 years.  She has left lower quadrant abdominal pain likely related  to the ongoing constipation.  She does have some chronic  gastroesophageal reflux disease as she has been on chronic proton pump  inhibitor therapy.  She complains of some mild intermittent dysphagia.  Initially, we need to  retrieve records as she has had a prior EGD and  colonoscopy.  We will adjust her medications to see if that helps.  We  will retrieve recent labs and further plan recommended at that point.   PLAN:  1. Retrieve records regarding colonoscopy and EGD, and labs.  2. She will switch from Metamucil to Benefiber once daily, samples      provided.  3. Switch from Kapidex to Prilosec 20 mg b.i.d., #20 samples provided.  4. Sustenex 1 daily, #30 samples provided.  5. Further recommendations to follow.   I would like to thank Dr. Assunta Found for allowing [name] to take part in  the care of this patient.      Tana Coast, P.A.  Electronically Signed     R. Roetta Sessions, M.D.  Electronically Signed    LL/MEDQ  D:  06/19/2008  T:  06/20/2008  Job:  [id number]   cc:   Corrie Mckusick, M.D.

## 2011-02-17 NOTE — Op Note (Signed)
NAMEMECHELLE, PATES                ACCOUNT NO.:  [id number]   MEDICAL RECORD NO.:  [id number]          PATIENT TYPE:  AMB   LOCATION:  DAY                           FACILITY:  [facility]   PHYSICIAN:  R. Roetta Sessions, M.D. DATE OF BIRTH:  07-06-1956   DATE OF PROCEDURE:  08/23/2008  DATE OF DISCHARGE:                                PROCEDURE NOTE   PROCEDURE:  Diagnostic EGD.   INDICATIONS FOR PROCEDURE:  A 55 year old lady with a worsening  gastroesophageal reflux disease symptoms, vague esophageal dysphagia,  relating to  pills only.  She previously did very well on Zegerid and  has failed multiple other proton pump inhibitor trials.  EGD is now  being done to further evaluate repair of refractory symptoms and  gastroesophageal reflux disease.  Potential for esophageal dilation  reviewed.  Risks, benefits, alternatives, and limitations have been  discussed.  All party's questions answered.   PROCEDURE NOTE:  O2 saturation, blood pressure, pulse, and respirations  were monitored throughout the entire procedure.   CONSCIOUS SEDATION:  Versed 5 mg IV and Demerol 100 mg in IV divided  doses.   INSTRUMENT:  Pentax video chip system.   ANESTHESIA:  Cetacaine spray for topical pharyngeal anesthesia.   FINDINGS:  Examination of the tubular esophagus revealed a tiny distal  esophageal erosions circumferentially within 5 mm of the EG junction.  Esophageal mucosa otherwise appeared normal.  The tubular esophagus was  widely patent through the EG junction.  Stomach:  Gastric cavity was  empty and insufflated well with air.  Thorough examination of gastric  mucosa including retroflexion of the proximal stomach and  esophagogastric junction demonstrated a couple of tiny antral erosions,  otherwise the gastric mucosa appeared entirely normal.  Pylorus was  patent and easily traversed.  Examination of the bulb, second portion  revealed no abnormalities.  Therapeutic/diagnostic maneuvers  performed:  None.   The patient tolerated the procedure well and was reactive to the  Endoscopy.   IMPRESSION:  1. Tiny distal esophageal erosions consistent with mild erosive reflux      esophagitis, otherwise unremarkable esophagus.  2. Tiny antral erosions of doubtful clinical significance, otherwise      normal stomach, patent pylorus, normal D1 and D2.   RECOMMENDATIONS:  1. The patient has gastroesophageal reflux disease.  She has done the      best over time with Zegerid, but has not been on that medication      for any extended period of time given the financial constraints.      At this point in time, I really want to get her symptoms under      control, and therefore, we will arrange for her to get a bag full      of Zegerid samples through the office.  She is to leave here and go      to the office and get Zegerid to begin Zegerid 40 mg capsule once      daily before breakfast.  Antireflux literature provided to Ms.      August Saucer.  2. Followup appointment with  [name] in 1 month to assess her progress.      Jonathon Bellows, M.D.  Electronically Signed     RMR/MEDQ  D:  08/23/2008  T:  08/24/2008  Job:  [id number]   cc:   Dr. Phillips Odor

## 2011-02-20 NOTE — Discharge Summary (Signed)
Robin [name], Robin [name]                ACCOUNT NO.:  [id number]   MEDICAL RECORD NO.:  [id number]          PATIENT TYPE:  OBV   LOCATION:  4743                         FACILITY:  [facility]   PHYSICIAN:  Nanetta Batty, M.D.   DATE OF BIRTH:  [birth year]   DATE OF ADMISSION:  04/27/2006  DATE OF DISCHARGE:  04/28/2006                                 DISCHARGE SUMMARY   DISCHARGE DIAGNOSES:  1. 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  2. Gastroesophageal reflux disease.  3. Had stopped the Nexium.  4. History of hypertension.  5. History of hyperlipidemia.  6. History of normal course in 2003.  7. Obesity.   CONDITION ON DISCHARGE:  Improved.   PROCEDURE:  None.   DISCHARGE MEDICATIONS:  1. Benicar 20/12.5 1 daily.  2. Vytorin 10/40 1 every evening.  3. Claritin 10 mg daily.  4. Aspirin 81 mg daily.  5. Prilosec OTC 1 to 2 daily.  6. Ibuprofen 600 mg 1 every 8 hours as needed for pain.  7. Darvocet-N 100 1 to 2 every 6 hours as needed.   DISCHARGE INSTRUCTIONS:  1. Low-fat, low-salt diet.  2. See Dr. Raford Pitcher May 19, 2006 at 11:15 a.m.  3. Follow up with Dr. Sherwood Gambler.  Call his office for an appointment.   HISTORY OF PRESENT ILLNESS:  A 55 year old African American female who came  from Dr. Sharyon Medicus office with pain into her jaw and ear that is constant with  vague symptoms of shortness of breath, and at the same time, has severe  gastroesophageal reflux disease.  She has no coronary disease with cardiac  catheterization in 2003.  Normal LV function as well and normal lower  extremity Dopplers.  She does have a history of hypertension and  hyperlipidemia and is treated.  Symptoms had occurred for about 3 days  before she was admitted, April 27, 2006.   PAST MEDICAL HISTORY:  As stated.  Positive abnormal Cardiolite with a clean  course in 2003.  Hypertension, hyperlipidemia, and obesity.   OUTPATIENT MEDICATIONS:  Essentially the same as the discharge meds.  Benicar, Vytorin.  She had stopped her aspirin and was not on Nexium because  she could not afford it.  Therefore, we discharged her on the Prilosec.   ALLERGIES:  NO KNOWN DRUG ALLERGIES.   FAMILY HISTORY, SOCIAL HISTORY, REVIEW OF SYSTEMS:  See H and P.   PHYSICAL EXAMINATION AT DISCHARGE:  VITAL SIGNS:  Blood pressure 104/56,  pulse 60, respirations 20, temperature 97.7, oxygen saturation 97%.  HEART:  Regular rate and rhythm.  NECK:  Supple.  No JVD.  LUNGS:  Without rales.  ABDOMEN:  Positive bowel sounds.   LABORATORY DATA:  Hemoglobin 13, hematocrit 39.8, WBC 5.3, platelets  364,000, neutrophils 38, lymphs 51, mono none, EOs 1, baso 1.  Prothrombin  time 13, INR of 1, PTT 31.   Chemistry:  Sodium 139, potassium 3.8, chloride 105, CO2 27, glucose 108,  BUN 9, creatinine 0.9.  Calcium 9.1, total protein 7, albumin 3.8, AST 24,  ALT 26, ALP 58.  Total bilirubin 1, magnesium 2.  These remained stable.  Glycohemoglobin 5.9.  CK-MB: CKs were elevated at 444, 393, 358.  But MBs  and relative index were negative.  MB 3.8, 3.3, and 2.6.  Troponin I  negative at 0.01, 0.02.   Cholesterol 134, triglyceride 35, HDL 47, and LDL 80.  TSH 4.45.   Chest x-ray:  No active cardiopulmonary disease.  EKG:  Sinus rhythm.  Normal EKG.   HOSPITAL COURSE:  Ms. Welden was admitted by Dr. Allyson Sabal for chest pain to rule  out MI.  She was put on telemetry and PPI was started.  Cardiac enzymes were  negative.  By the next day she was feeling much better and was seen and  discharged by Dr. Nicki Guadalajara.  She will follow up with him as an  outpatient.      Darcella Gasman. Annie Paras, N.P.      Nanetta Batty, M.D.  Electronically Signed    LRI/MEDQ  D:  06/03/2006  T:  06/04/2006  Job:  [id number]   cc:   Nanetta Batty, M.D.  Phillips Odor, M.D.

## 2011-02-20 NOTE — H&P (Signed)
Robin [name], Robin [name]                ACCOUNT NO.:  [id number]   MEDICAL RECORD NO.:  [id number]          PATIENT TYPE:  AMB   LOCATION:                                FACILITY:  [facility]   PHYSICIAN:  Dalia Heading, M.D.  DATE OF BIRTH:  Sep 10, 1956   DATE OF ADMISSION:  DATE OF DISCHARGE:  LH                                HISTORY & PHYSICAL   CHIEF COMPLAINT:  Abdominal pain, gastroesophageal reflux disease.   HISTORY AND PHYSICAL:  The patient is a 55 year old black female who is  referred for endoscopic evaluation.  She needs a colonoscopy and an EGD for  abdominal pain and GERD.  She has been having intermittent left-sided  abdominal pain.  It is crampy in nature.  She was also having reflux and  heartburn, especially at night when she lays down.  No weight loss, nausea,  vomiting, diarrhea, constipation, melena, or hematochezia have been noted.  She has never had a colonoscopy.  There is no family history of colon  carcinoma.  No history of hemorrhoidal disease.   PAST MEDICAL HISTORY:  Knee problems.   PAST SURGICAL HISTORY:  1.  Hysterectomy.  2.  Knee surgery.  3.  Heart catheterization.   CURRENT MEDICATIONS:  1.  Glucosamine.  2.  Vitamins.  3.  ____________.  4.  Zegrinid.  5.  Glycolax.  6.  Vytorin.   ALLERGIES:  No known drug allergies.   REVIEW OF SYSTEMS:  Noncontributory.   PHYSICAL EXAMINATION:  GENERAL:  The patient is a well-developed, well-  nourished black female in no acute distress.  VITAL SIGNS:  She is afebrile, and vital signs are stable.  LUNGS:  Clear to auscultation with equal breath sounds bilaterally.  HEART:  Regular rate and rhythm without S3, S4, murmurs.  ABDOMEN:  Soft, nontender, nondistended.  No hepatosplenomegaly or masses  are noted.  RECTAL:  Deferred to the procedure.   IMPRESSION:  1.  Gastroesophageal reflux disease.  2.  Abdominal pain.   PLAN:  The patient is scheduled for an esophagogastroduodenoscopy and  colonoscopy  on May 06, 2005.  The risks and the benefits of the procedure,  including bleeding and perforation, were fully explained to the patient, who  gave informed consent.       MAJ/MEDQ  D:  04/23/2005  T:  04/23/2005  Job:  [id number]   cc:   Corrie Mckusick, M.D.  Fax: [phone]

## 2011-02-20 NOTE — Cardiovascular Report (Signed)
NAMEELIZE, PINON                            ACCOUNT NO.:  [id number]   MEDICAL RECORD NO.:  [id number]                   PATIENT TYPE:  OIB   LOCATION:  2876                                 FACILITY:  [facility]   PHYSICIAN:  Nanetta Batty, M.D.                DATE OF BIRTH:  05-Aug-1956   DATE OF PROCEDURE:  08/17/2002  DATE OF DISCHARGE:  08/17/2002                              CARDIAC CATHETERIZATION   PROCEDURE:  Cardiac catheterization with diagnostic coronary arteriography.   CARDIOLOGIST:  Nanetta Batty, M.D.   INDICATIONS FOR PROCEDURE:  The patient is a 55 year old severely overweight  married African-American female, the mother of four children, who works as a  Surveyor, mining.  She quit smoking 9 years ago, and has a history of  hypertension, diabetes and hyperlipidemia.  She complains of chest fullness  with increasing shortness of breath over the last several weeks.  A  Cardiolite stress test suggested anterior ischemia versus breast  attenuation.  Because of this she presents for a diagnostic coronary  arteriography, to rule out an ischemic etiology.   DESCRIPTION OF PROCEDURE:  The patient was brought to the second floor [facility] in the post-  absorptive state.  She was premedicated with p.o. Valium and IV Versed.  Her  right groin was prepped and shaved in the usual sterile fashion.  Xylocaine  1% was used for local anesthesia.  A 6-French sheath was inserted in the  right femoral artery using a sterile Seldinger technique.  The 6-French  right and left Judkins diagnostic catheters  along with a pigtail catheter  were used for  a subsequent angiography, left ventriculography and a distal  abdominal aortography.  Omnipaque dye was used for the entirety of the case.  The retrograde aortic and left ventricular pull-back pressures were  recorded.   HEMODYNAMICS:  Aortic systolic pressure:  185.  Diastolic  pressure:  90.  Left ventricular systolic pressure:  181.  End-diastolic pressure:  27.   SELECTIVE CORONARY ANGIOGRAPHY:  1. Left main coronary artery:  The left main coronary artery is normal.  2. Left anterior descending coronary artery:  The left anterior descending     coronary artery is normal.  3. Left circumflex coronary artery:  The left circumflex coronary artery is     normal.  4. Right coronary artery:  The right coronary artery was dominant and     normal.   LEFT VENTRICULOGRAPHY:  An RAO left ventriculogram was performed using 25 cc  of Omnipaque dye at 12 cc per second.  The overall LVEF is estimated at  greater than 60% without focal wall motion abnormalities.   DISTAL ABDOMINAL AORTOGRAPHY:  Performed using 30 cc of Omnipaque dye at 20  cc per second.  The renal arteries are widely patent.  The inferior  abdominal aorta and the iliac bifurcation appeared  free of __________.   IMPRESSION:  The patient has essentially normal coronary arteries and normal  left ventricular function.  I believe that her chest pain is non-cardiac,  most likely gastrointestinal.  She will be treated empirically with a proton  pump inhibitor.  Her hypertension also is essential.  The sheaths were removed and pressure was held on the groin to achieve  hemostatis.  The patient left the laboratory in stable condition.   DISPOSITION:  She will be discharged home later tonight as an outpatient,  and will see a P.A. back in two weeks for a groin check, after which she  will follow up with Dr. Dorthey Sawyer for treatment of hypertension and risk  factor modification.                                                Nanetta Batty, M.D.    Robin [name]  D:  08/17/2002  T:  08/18/2002  Job:  [id number]   cc:   [facility]   Dorthey Sawyer, M.D.  [place], [place]

## 2011-03-19 ENCOUNTER — Other Ambulatory Visit: Payer: Self-pay | Admitting: Orthopedic Surgery

## 2011-03-19 DIAGNOSIS — M25511 Pain in right shoulder: Secondary | ICD-10-CM

## 2011-03-30 ENCOUNTER — Ambulatory Visit
Admission: RE | Admit: 2011-03-30 | Discharge: 2011-03-30 | Disposition: A | Discharge status: Home / Self Care | Payer: Managed Care, Other (non HMO) | Source: Ambulatory Visit | Attending: Orthopedic Surgery | Admitting: Orthopedic Surgery

## 2011-03-30 DIAGNOSIS — M25511 Pain in right shoulder: Secondary | ICD-10-CM

## 2011-06-25 ENCOUNTER — Encounter: Payer: Self-pay | Admitting: Gastroenterology

## 2011-06-25 ENCOUNTER — Ambulatory Visit (INDEPENDENT_AMBULATORY_CARE_PROVIDER_SITE_OTHER): Payer: Managed Care, Other (non HMO) | Admitting: Gastroenterology

## 2011-06-25 VITALS — BP 128/78 | HR 82 | Temp 97.5°F | Ht 68.0 in | Wt 277.2 lb

## 2011-06-25 DIAGNOSIS — R198 Other specified symptoms and signs involving the digestive system and abdomen: Secondary | ICD-10-CM

## 2011-06-25 DIAGNOSIS — R630 Anorexia: Secondary | ICD-10-CM

## 2011-06-25 DIAGNOSIS — R1319 Other dysphagia: Secondary | ICD-10-CM

## 2011-06-25 DIAGNOSIS — R634 Abnormal weight loss: Secondary | ICD-10-CM | POA: Insufficient documentation

## 2011-06-25 DIAGNOSIS — R300 Dysuria: Secondary | ICD-10-CM | POA: Insufficient documentation

## 2011-06-25 DIAGNOSIS — K219 Gastro-esophageal reflux disease without esophagitis: Secondary | ICD-10-CM

## 2011-06-25 DIAGNOSIS — R194 Change in bowel habit: Secondary | ICD-10-CM | POA: Insufficient documentation

## 2011-06-25 LAB — COMPREHENSIVE METABOLIC PANEL
ALT: 16 U/L (ref 7–35)
AST: 16 U/L
Albumin: 4.2
Alkaline Phosphatase: 54 U/L
BUN: 12 mg/dL (ref 4–21)
Calcium: 9.4 mg/dL
Creat: 0.95

## 2011-06-25 LAB — CBC WITH DIFFERENTIAL/PLATELET: WBC: 5.5

## 2011-06-25 MED ORDER — DEXLANSOPRAZOLE 60 MG PO CPDR: 60.0000 mg | DELAYED_RELEASE_CAPSULE | Freq: Two times a day (BID) | ORAL | Status: DC

## 2011-06-25 NOTE — Progress Notes (Signed)
Primary Care Physician:  Colette Ribas, MD  Primary Gastroenterologist:  Roetta Sessions, MD   Chief Complaint  Patient presents with  . Abdominal Pain    sick on stomach    HPI:  Robin [name] is a 55 y.o. female here for further evaluation of GI symptoms. She complains of heartburn most days, all day long. Taking Dexilant once daily. Cannot seem to control. Nausea, no vomiting. Having problems with dysphagia to  Breads/meats. Eating less. Weight down ten pounds in the last one month. Weight was 289 in March.  Abd bloating. Left sided abdominal pain. Worse with meals and associated with bloating. BM used to be regular but lately small stools and then bit more. Never feels relief. No melena, brbpr. Some dysuria.     Current Outpatient Prescriptions  Medication Sig Dispense Refill  . aspirin 81 MG tablet Take 81 mg by mouth daily.        Marland Kitchen buPROPion (WELLBUTRIN XL) 150 MG 24 hr tablet Take 150 mg by mouth daily.        . colesevelam (WELCHOL) 625 MG tablet Take 1,875 mg by mouth 3 (three) times daily.        . cyclobenzaprine (FLEXERIL) 10 MG tablet Take 10 mg by mouth daily.        Marland Kitchen dexlansoprazole (DEXILANT) 60 MG capsule Take 1 capsule (60 mg total) by mouth 2 (two) times daily before a meal.  15 capsule  0  . estradiol (ESTRACE) 0.5 MG tablet Take 0.5 mg by mouth daily.        . fish oil-omega-3 fatty acids 1000 MG capsule Take 2 g by mouth daily.        Marland Kitchen HYDROcodone-acetaminophen (VICODIN) 2.5-500 MG per tablet Take 1 tablet by mouth as needed.        Marland Kitchen levocetirizine (XYZAL) 5 MG tablet Take 5 mg by mouth daily.        . Multiple Vitamin (MULTIVITAMIN) tablet Take 1 tablet by mouth daily.        . pregabalin (LYRICA) 50 MG capsule Take 50 mg by mouth 3 (three) times daily.          Allergies as of 06/25/2011  . (No Known Allergies)    Past Medical History  Diagnosis Date  . GERD (gastroesophageal reflux disease)   . Constipation   . Anxiety disorder   .  Hyperlipidemia   . Hypertension   . Chronic neck pain     Past Surgical History  Procedure Date  . Knee arthroscopy   . Cardiac catheterization   . Vaginal prolapse repair   . Rectocele repair   . Esophagogastroduodenoscopy 08/23/2008  . Colonoscopy 2006    Dr. Franky Macho, hyperplastic polyps    Family History  Problem Relation Age of Onset  . Kidney cancer Father   . Stomach cancer      aunt  . Breast cancer      grandmother  . Colon cancer Neg Hx     History   Social History  . Marital Status: Married    Spouse Name: N/A    Number of Children: 4  . Years of Education: N/A   Occupational History  .     Social History Main Topics  . Smoking status: Former Smoker -- 0.5 packs/day    Types: Cigarettes  . Smokeless tobacco: Not on file   Comment: 17 yrs ago  . Alcohol Use: No  . Drug Use: No  . Sexually Active: Not  on file   Other Topics Concern  . Not on file   Social History Narrative  . No narrative on file      ROS:  General: Negative for anorexia, weight loss, fever, chills, fatigue, weakness. Eyes: Negative for vision changes.  ENT: Negative for hoarseness, nasal congestion. CV: Negative for chest pain, angina, palpitations, dyspnea on exertion, peripheral edema.  Respiratory: Negative for dyspnea at rest, dyspnea on exertion, cough, sputum, wheezing.  GI: See history of present illness. GU:  Negative for  hematuria, urinary incontinence, urinary frequency, nocturnal urination. C/O dysuria. MS: Negative for joint pain, low back pain.  Derm: Negative for rash or itching.  Neuro: Negative for weakness, abnormal sensation, seizure, frequent headaches, memory loss, confusion.  Psych: Negative for anxiety, depression, suicidal ideation, hallucinations.  Endo: Negative for unusual weight change.  Heme: Negative for bruising or bleeding. Allergy: Negative for rash or hives.    Physical Examination:  BP 128/78  Pulse 82  Temp(Src) 97.5 F (36.4  C) (Temporal)  Ht 5\' 8"  (1.727 m)  Wt 277 lb 3.2 oz (125.737 kg)  BMI 42.15 kg/m2   General: Well-nourished, well-developed in no acute distress.  Head: Normocephalic, atraumatic.   Eyes: Conjunctiva pink, no icterus. Mouth: Oropharyngeal mucosa moist and pink , no lesions erythema or exudate. Neck: Supple without thyromegaly, masses, or lymphadenopathy.  Lungs: Clear to auscultation bilaterally.  Heart: Regular rate and rhythm, no murmurs rubs or gallops.  Abdomen: Bowel sounds are normal, nontender, nondistended, no hepatosplenomegaly or masses, no abdominal bruits or    hernia , no rebound or guarding.   Rectal: Defer to time of colonoscopy. Extremities: No lower extremity edema. No clubbing or deformities.  Neuro: Alert and oriented x 4 , grossly normal neurologically.  Skin: Warm and dry, no rash or jaundice.   Psych: Alert and cooperative, normal mood and affect.

## 2011-06-26 ENCOUNTER — Encounter: Payer: Self-pay | Admitting: Gastroenterology

## 2011-06-26 LAB — CBC
HCT: 35.8 — ABNORMAL LOW
MCHC: 33.7
MCV: 86.4
Platelets: 332
Platelets: 363
RBC: 4.61
RDW: 12.5
WBC: 6.2

## 2011-06-26 LAB — BASIC METABOLIC PANEL
BUN: 6
BUN: 7
CO2: 29
Calcium: 9.5
Creatinine, Ser: 0.84
GFR calc Af Amer: >60
GFR calc non Af Amer: >60
GFR calc non Af Amer: >60
Glucose, Bld: 87
Potassium: 3.7
Sodium: 138

## 2011-06-26 LAB — URINALYSIS, ROUTINE W REFLEX MICROSCOPIC
Bilirubin Urine: NEGATIVE
Ketones, ur: NEGATIVE
Nitrite: NEGATIVE
Protein, ur: NEGATIVE

## 2011-06-26 LAB — URINALYSIS
Glucose, UA: NEGATIVE mg/dL
Nitrite: NEGATIVE
pH: 6 (ref 5.0–8.0)

## 2011-06-26 NOTE — Assessment & Plan Note (Addendum)
Poorly-controlled GERD. Continues to have refractory symptoms associated with anorexia and weight loss. Continues to have dysphagia to solid foods. Retrieval lab work from Dr. Lamar Blinks office which was just done today. Recommend EGD with dilation for further evaluation. Patient plans to call back and schedule what she is able to reschedule Will increase her Dexilant to twice a day, #15 samples provided.

## 2011-06-26 NOTE — Assessment & Plan Note (Signed)
Change in bowel habits. No blood in the stool. Last colonoscopy or procedures. Recommend colonoscopy.  I have discussed the risks, alternatives, benefits with regards to but not limited to the risk of reaction to medication, bleeding, infection, perforation and the patient is agreeable to proceed. Written consent to be obtained.

## 2011-06-26 NOTE — Assessment & Plan Note (Signed)
Check UA 

## 2011-06-26 NOTE — Progress Notes (Signed)
Quick Note:    UA negative  ______

## 2011-06-29 NOTE — Progress Notes (Signed)
Cc to PCP 

## 2011-06-29 NOTE — Progress Notes (Signed)
Quick Note:  Let pt know. U/A was negative and culture looked like it was contaminated, no pathogens.  If still with urinary burning etc, can repeat urine culture but I don't think she has infection. ______

## 2011-07-06 ENCOUNTER — Other Ambulatory Visit: Payer: Self-pay | Admitting: General Practice

## 2011-07-06 ENCOUNTER — Encounter: Payer: Self-pay | Admitting: General Practice

## 2011-07-06 DIAGNOSIS — K219 Gastro-esophageal reflux disease without esophagitis: Secondary | ICD-10-CM

## 2011-07-20 ENCOUNTER — Telehealth: Payer: Self-pay

## 2011-07-20 NOTE — Telephone Encounter (Signed)
Gastroenterology Pre-Procedure Form  Request Date: 07/20/2011      Requesting Physician : Dr. Phillips Odor    PATIENT INFORMATION:  Robin [name] is a 55 y.o., female (DOB=May 31, 1956).  PROCEDURE: Procedure(s) requested:Colonoscopy/ EGD/ED Procedure Reason: GERD/DYSPHAGIA/ WTLOSS/ ANOREXIA/ CHANGE IN BM'S  PATIENT REVIEW QUESTIONS: The patient reports the following:   1. Diabetes Melitis: no 2. Joint replacements in the past 12 months: no 3. Major health problems in the past 3 months: no 4. Has an artificial valve or MVP:no 5. Has been advised in past to take antibiotics in advance of a procedure like teeth cleaning: no}    MEDICATIONS & ALLERGIES:    Patient reports the following regarding taking any blood thinners:   Plavix? no Aspirin?yes  Coumadin?  no  Patient confirms/reports the following medications:  Current Outpatient Prescriptions  Medication Sig Dispense Refill  . aspirin 81 MG tablet Take 81 mg by mouth daily.        Marland Kitchen buPROPion (WELLBUTRIN XL) 150 MG 24 hr tablet Take 150 mg by mouth daily.        . colesevelam (WELCHOL) 625 MG tablet Take 1,875 mg by mouth 3 (three) times daily.        . cyclobenzaprine (FLEXERIL) 10 MG tablet Take 10 mg by mouth daily.        Marland Kitchen dexlansoprazole (DEXILANT) 60 MG capsule Take 1 capsule (60 mg total) by mouth 2 (two) times daily before a meal.  15 capsule  0  . estradiol (ESTRACE) 0.5 MG tablet Take 0.5 mg by mouth daily.        . fish oil-omega-3 fatty acids 1000 MG capsule Take 2 g by mouth daily.        Marland Kitchen HYDROcodone-acetaminophen (VICODIN) 2.5-500 MG per tablet Take 1 tablet by mouth as needed.        Marland Kitchen levocetirizine (XYZAL) 5 MG tablet Take 5 mg by mouth daily.        . Multiple Vitamin (MULTIVITAMIN) tablet Take 1 tablet by mouth daily.        . pregabalin (LYRICA) 50 MG capsule Take 50 mg by mouth 3 (three) times daily. Said she hasn't taken in a few weeks because she cannot afford        Patient confirms/reports the following  allergies:  No Known Allergies  Patient is appropriate to schedule for requested procedure(s): yes  AUTHORIZATION INFORMATION Primary Insurance:   ID #:   Group #:  Pre-Cert / Auth required:  Pre-Cert / Auth #:   Secondary Insurance:   ID #:   Group #:  Pre-Cert / Auth required Pre-Cert / Auth #:   No orders of the defined types were placed in this encounter.    SCHEDULE INFORMATION: Procedure has been scheduled as follows:  Date: 08/06/2011     Time: 10:30 am  Location: [facility] Short Stay  This Gastroenterology Pre-Precedure Form is being routed to the following provider(s) for review: R. Roetta Sessions, MD

## 2011-07-21 NOTE — Telephone Encounter (Signed)
Please fwd to LSL as she saw pt 9/20. I am not certain if she planned on pt being done in Endo or OR w/ propofol? Thanks

## 2011-07-21 NOTE — Telephone Encounter (Signed)
Ok to continue for EGD/ED/TCS as planned. No changes in medications. Procedure to be done in endo.

## 2011-07-21 NOTE — Telephone Encounter (Signed)
Routing to Tana Coast, PA per Lorenza Burton, NP.

## 2011-08-05 MED ORDER — SODIUM CHLORIDE 0.45 % IV SOLN
Freq: Once | INTRAVENOUS | Status: AC
  Administered 2011-08-06: 10:00:00 via INTRAVENOUS

## 2011-08-06 ENCOUNTER — Encounter (HOSPITAL_COMMUNITY)
Admission: RE | Disposition: A | Discharge status: Home / Self Care | Payer: Self-pay | Source: Ambulatory Visit | Attending: Internal Medicine

## 2011-08-06 ENCOUNTER — Ambulatory Visit (HOSPITAL_COMMUNITY)
Admission: RE | Admit: 2011-08-06 | Discharge: 2011-08-06 | Disposition: A | Discharge status: Home / Self Care | Payer: Managed Care, Other (non HMO) | Source: Ambulatory Visit | Attending: Internal Medicine | Admitting: Internal Medicine

## 2011-08-06 ENCOUNTER — Other Ambulatory Visit: Payer: Self-pay | Admitting: Internal Medicine

## 2011-08-06 DIAGNOSIS — D126 Benign neoplasm of colon, unspecified: Secondary | ICD-10-CM | POA: Insufficient documentation

## 2011-08-06 DIAGNOSIS — R109 Unspecified abdominal pain: Secondary | ICD-10-CM | POA: Insufficient documentation

## 2011-08-06 DIAGNOSIS — R198 Other specified symptoms and signs involving the digestive system and abdomen: Secondary | ICD-10-CM

## 2011-08-06 DIAGNOSIS — K219 Gastro-esophageal reflux disease without esophagitis: Secondary | ICD-10-CM

## 2011-08-06 DIAGNOSIS — R1310 Dysphagia, unspecified: Secondary | ICD-10-CM | POA: Insufficient documentation

## 2011-08-06 DIAGNOSIS — I10 Essential (primary) hypertension: Secondary | ICD-10-CM | POA: Insufficient documentation

## 2011-08-06 DIAGNOSIS — Z79899 Other long term (current) drug therapy: Secondary | ICD-10-CM | POA: Insufficient documentation

## 2011-08-06 DIAGNOSIS — R110 Nausea: Secondary | ICD-10-CM | POA: Insufficient documentation

## 2011-08-06 DIAGNOSIS — Z7982 Long term (current) use of aspirin: Secondary | ICD-10-CM | POA: Insufficient documentation

## 2011-08-06 DIAGNOSIS — K222 Esophageal obstruction: Secondary | ICD-10-CM | POA: Insufficient documentation

## 2011-08-06 DIAGNOSIS — K449 Diaphragmatic hernia without obstruction or gangrene: Secondary | ICD-10-CM

## 2011-08-06 DIAGNOSIS — E785 Hyperlipidemia, unspecified: Secondary | ICD-10-CM | POA: Insufficient documentation

## 2011-08-06 HISTORY — PX: ESOPHAGOGASTRODUODENOSCOPY: SHX1529

## 2011-08-06 HISTORY — PX: COLONOSCOPY: SHX174

## 2011-08-06 SURGERY — COLONOSCOPY, ESOPHAGOGASTRODUODENOSCOPY (EGD) AND ESOPHAGEAL DILATION (ED)
Anesthesia: Moderate Sedation

## 2011-08-06 MED ORDER — MIDAZOLAM HCL 5 MG/5ML IJ SOLN
INTRAMUSCULAR | Status: DC | PRN
  Administered 2011-08-06: 1 mg via INTRAVENOUS
  Administered 2011-08-06: 2 mg via INTRAVENOUS
  Administered 2011-08-06: 1 mg via INTRAVENOUS
  Administered 2011-08-06: 2 mg via INTRAVENOUS

## 2011-08-06 MED ORDER — MEPERIDINE HCL 100 MG/ML IJ SOLN
INTRAMUSCULAR | Status: DC | PRN
  Administered 2011-08-06: 25 mg via INTRAVENOUS
  Administered 2011-08-06 (×2): 50 mg via INTRAVENOUS

## 2011-08-06 MED ORDER — STERILE WATER FOR IRRIGATION IR SOLN
Status: DC | PRN
  Administered 2011-08-06: 11:00:00

## 2011-08-06 MED ORDER — MIDAZOLAM HCL 5 MG/5ML IJ SOLN
INTRAMUSCULAR | Status: AC
  Filled 2011-08-06: qty 10

## 2011-08-06 MED ORDER — MEPERIDINE HCL 100 MG/ML IJ SOLN
INTRAMUSCULAR | Status: AC
  Filled 2011-08-06: qty 2

## 2011-08-10 ENCOUNTER — Telehealth: Payer: Self-pay | Admitting: Internal Medicine

## 2011-08-10 NOTE — Telephone Encounter (Signed)
Pt called to let RMR's nurse know that she has another phone number to reach her at when her results are ready. [phone]

## 2011-08-14 NOTE — Progress Notes (Unsigned)
Please let her know the end of her small intestine is inflamed on biopsies. This is nonspecific. Polyp benign. [facility]: is she taking any nonsteroidal drugs aside from aspirin. If she is, ask her to stop them completely. Continued Dexilant 60 mg daily and see [name] back in the office in one month. If she's not taking any other nonsteroidals, when we see her back I would contemplate getting a capsule study upper small intestine to evaluate TI abnormalities further. Please send a copy of the pathology report to her referring/PCP; please call her and let her in about these results. I'll plan not send a letter if she's notified by phone. Thanks ----- Message ----- From: Evalee Mutton, LPN Sent: 40/06/8118 11:03 AM To: Corbin Ade, MD  Pt requesting path results.

## 2011-08-14 NOTE — Progress Notes (Unsigned)
Pt aware, she is taking advil, advised her to stop taking it and she said she would stop. Pt requested some dexilant samples. Left #5 boxes at front desk.   SS- please schedule pt ov in 1 month.  LW- please cc pcp path

## 2011-08-17 ENCOUNTER — Encounter: Payer: Self-pay | Admitting: Internal Medicine

## 2011-08-17 NOTE — Progress Notes (Signed)
Results Cc to PCP  

## 2011-08-17 NOTE — Progress Notes (Signed)
Pt is aware of OV on 09/16/11 at 0800 with LSL and appt card was mailed

## 2011-09-16 ENCOUNTER — Ambulatory Visit: Payer: Managed Care, Other (non HMO) | Admitting: Gastroenterology

## 2011-09-16 ENCOUNTER — Encounter: Payer: Self-pay | Admitting: Internal Medicine

## 2011-09-18 ENCOUNTER — Encounter: Payer: Self-pay | Admitting: Gastroenterology

## 2011-09-18 ENCOUNTER — Ambulatory Visit (INDEPENDENT_AMBULATORY_CARE_PROVIDER_SITE_OTHER): Payer: Managed Care, Other (non HMO) | Admitting: Gastroenterology

## 2011-09-18 DIAGNOSIS — E739 Lactose intolerance, unspecified: Secondary | ICD-10-CM

## 2011-09-18 DIAGNOSIS — R857 Abnormal histological findings in specimens from digestive organs and abdominal cavity: Secondary | ICD-10-CM | POA: Insufficient documentation

## 2011-09-18 DIAGNOSIS — R897 Abnormal histological findings in specimens from other organs, systems and tissues: Secondary | ICD-10-CM

## 2011-09-18 DIAGNOSIS — R1084 Generalized abdominal pain: Secondary | ICD-10-CM | POA: Insufficient documentation

## 2011-09-18 DIAGNOSIS — R634 Abnormal weight loss: Secondary | ICD-10-CM

## 2011-09-18 DIAGNOSIS — R1319 Other dysphagia: Secondary | ICD-10-CM

## 2011-09-18 DIAGNOSIS — K219 Gastro-esophageal reflux disease without esophagitis: Secondary | ICD-10-CM

## 2011-09-18 NOTE — Assessment & Plan Note (Signed)
Resolved s/p esophageal dilation.  

## 2011-09-18 NOTE — Assessment & Plan Note (Signed)
Improved. She will try to drop back to Dexilant 60mg  once daily and only take nighttime dose if symptomatic. Samples provided.

## 2011-09-18 NOTE — Assessment & Plan Note (Signed)
Vague generalized abdominal discomfort. She will call next week to pick up samples of probiotics when they become available. Stop dairy products for two weeks. If symptoms improve, then consider lactaid products.

## 2011-09-18 NOTE — Patient Instructions (Signed)
Please avoid dairy products for two weeks. If you notice significant improvement in your symptoms, consider soy based milk, lactaid tablets prior to consumption of dairy.  Please take Dexilant 60mg  30 minutes before breakfast only. If you have breakthrough heartburn later in the day you may then take second dose.   I will talk to Dr. Jena Gauss about your Celebrex use.   Please call next week to see if we have some probiotic samples.   Lactose Intolerance, Adult Lactose intolerance is when the body is not able to digest lactose, a sugar found in milk and milk products. Lactose intolerance is caused by your body not producing enough of the enzyme lactase. When there is not enough lactase to digest the amount of lactose consumed, discomfort may be felt. Lactose intolerance is not a milk allergy. For most people, lactase deficiency is a condition that develops naturally over time. After about the age of 2, the body begins to produce less lactase. But many people may not experience symptoms until they are much older. CAUSES Things that can cause you to be lactose intolerant include:  Aging.   Being born without the ability to make lactase.   Certain digestive diseases.   Injuries to the small intestine.  SYMPTOMS   Feeling sick to your stomach (nauseous).   Diarrhea.   Cramps.   Bloating.   Gas.  Symptoms usually show up a half hour or 2 hours after eating or drinking products containing lactose. TREATMENT  No treatment can improve the body's ability to produce lactase. However, symptoms can be controlled through diet. A medicine may be given to you to take when you consume lactose-containing foods or drinks. The medicine contains the lactase enzyme, which help the body digest lactose better. HOME CARE INSTRUCTIONS  Eat or drink dairy products as told by your caregiver or dietician.   Take all medicine as directed by your caregiver.   Find lactose-free or lactose-reduced products at  your local grocery store.   Talk to your caregiver or dietician to decide if you need any dietary supplements.  The following is the amount of calcium needed from the diet:  19 to 50 years: 1000 mg   Over 50 years: 1200 mg  Calcium and Lactose in Common Foods Non-Dairy Products / Calcium Content (mg)  Calcium-fortified orange juice, 1 cup / 308 to 344 mg   Sardines, with edible bones, 3 oz / 270 mg   Salmon, canned, with edible bones, 3 oz / 205 mg   Soymilk, fortified, 1 cup / 200 mg   Broccoli (raw), 1 cup / 90 mg   Orange, 1 medium / 50 mg   Pinto beans,  cup / 40 mg   Tuna, canned, 3 oz / 10 mg   Lettuce greens,  cup / 10 mg  Dairy Products / Calcium Content (mg) / Lactose Content (g)  Yogurt, plain, low-fat, 1 cup / 415 mg / 5 g   Milk, reduced fat, 1 cup / 295 mg / 11 g   Swiss cheese, 1 oz / 270 mg / 1 g   Ice cream,  cup / 85 mg / 6 g   Cottage cheese,  cup / 75 mg / 2 to 3 g  SEEK MEDICAL CARE IF: You have no relief from your symptoms. Document Released: 09/21/2005 Document Revised: 06/03/2011 Document Reviewed: 12/19/2010 [facility] Patient Information 2012 [place], [place].  Lactose-Free Diet Lactose is a carbohydrate that is found mainly in milk and milk products, as  well as in foods with added milk or whey. Lactose must be digested by the enzyme lactase in order to be used by the body. Lactose intolerance occurs when there is a shortage of lactase. When your body is not able to digest lactose, you may feel sick to your stomach (nausea), bloated, and have cramps, gas, and diarrhea. TYPES OF LACTASE DEFICIENCY  Primary lactase deficiency. This is the most common type. It is characterized by a slow decrease in lactase activity.   Secondary lactase deficiency. This occurs following injury to the small intestinal mucosa as a result of a disease or condition. It can also occur as a result of surgery or after treatment with antibiotic medicines or cancer  drugs.  Tolerance to lactose varies widely. Each person must determine how much milk can be consumed without developing symptoms. Drinking smaller portions of milk throughout the day may be helpful. Some studies suggest that slowing gastric emptying may help increase tolerance of milk products. This may be done by:  Consuming milk or milk products with a meal rather than alone.   Consuming milk with a higher fat content.  There are many dairy products that may be tolerated better than milk by some people, including:  Cheese (especially aged cheese). The lactose content is much lower than in milk.   Cultured dairy products, such as yogurt, buttermilk, cottage cheese, and sweet acidophilus milk (kefir). These products are usually well tolerated by lactase-deficient people. This is because the healthy bacteria help digest lactose.   Lactose-hydrolyzed milk. This product contains 40% to 90% less lactose than milk and may also be well tolerated.  ADEQUACY These diets may be deficient in calcium, riboflavin, and vitamin D, according to the Recommended Dietary Allowances of the [employer]. Depending on individual tolerances and the use of milk substitutes, milk, or other dairy products, you may be able to meet these recommendations. SPECIAL NOTES  Lactose is a carbohydrate. The main food source for lactose is dairy products. Reading food labels is important. Many products contain lactose even when they are not made from milk. Look for the following words: whey, milk solids, dry milk solids, nonfat dry milk powder. Typical sources of lactose other than dairy products include breads, candies, cold cuts, prepared and processed foods, and commercial sauces and gravies.   All foods must be prepared without milk, cream, or other dairy foods.   A vitamin or mineral supplement may be necessary. Consult your caregiver or Registered Dietitian.   Lactose is also found in many prescription and  over-the-counter medicines.   Soy milk and lactose-free supplements may be used as an alternative to milk.  CHOOSING FOODS Breads and Starches  Allowed: Breads and rolls made without milk. [place], [place], or [place] bread. Soda crackers, graham crackers. Any crackers prepared without lactose. Cooked or dry cereals prepared without lactose (read labels). Any potatoes, pasta, or rice prepared without milk or lactose. Popcorn.   Avoid: Breads and rolls that contain milk. Prepared mixes such as muffins, biscuits, waffles, pancakes. Sweet rolls, donuts, [place] toast (if made with milk or lactose). Zwieback crackers, corn curls, or any crackers that contain lactose. Cooked or dry cereals prepared with lactose (read labels). Instant potatoes, frozen [place] fries, scalloped or au gratin potatoes.  Vegetables  Allowed: Fresh, frozen, and canned vegetables.   Avoid: Creamed or breaded vegetables. Vegetables in a cheese sauce or with lactose-containing margarines.  Fruit  Allowed: All fresh, canned, or frozen fruits that are not processed with lactose.  Avoid: Any canned or frozen fruits processed with lactose.  Meat and Meat Substitutes  Allowed: Plain beef, chicken, fish, [place], lamb, veal, pork, or ham. Kosher prepared meat products. Strained or junior meats that do not contain milk. Eggs, soy meat substitutes, nuts.   Avoid: Scrambled eggs, omelets, and souffles that contain milk. Creamed or breaded meat, fish, or fowl. Sausage products such as wieners, liver sausage, or cold cuts that contain milk solids. Cheese, cottage cheese, or cheese spreads.  Milk  Allowed: None.   Avoid: Milk (whole, 2%, skim, or chocolate). Evaporated, powdered, or condensed milk. Malted milk.  Soups and Combination Foods  Allowed: Bouillon, broth, vegetable soups, clear soups, consomms. Homemade soups made with allowed ingredients. Combination or prepared foods that do not contain milk or milk products (read  labels).   Avoid: Cream soups, chowders, commercially prepared soups containing lactose. Macaroni and cheese, pizza. Combination or prepared foods that contain milk or milk products.  Desserts and Sweets  Allowed: Water and fruit ices, gelatin, angel food cake. Homemade cookies, pies, or cakes made from allowed ingredients. Pudding (if made with water or a milk substitute). Lactose-free tofu desserts. Sugar, honey, corn syrup, jam, jelly, marmalade, molasses (beet sugar). Pure sugar candy, marshmallows.   Avoid: Ice cream, ice milk, sherbet, custard, pudding, frozen yogurt. Commercial cake and cookie mixes. Desserts that contain chocolate. Pie crust made with milk-containing margarine. Reduced calorie desserts made with a sugar substitute that contains lactose. Toffee, peppermint, butterscotch, chocolate, caramels.  Fats and Oils  Allowed: Butter (as tolerated, contains very small amounts of lactose). Margarines and dressings that do not contain milk. Vegetable oils, shortening, mayonnaise, nondairy cream and whipped toppings without lactose or milk solids added. Tomasa Blase.   Avoid: Margarines and salad dressings containing milk. Cream, cream cheese, peanut butter with added milk solids, sour cream, chip dips made with sour cream.  Beverages  Allowed: Carbonated drinks, tea, coffee and freeze-dried coffee, some instant coffees (check labels). Fruit drinks, fruit and vegetable juice, rice or soy milk.   Avoid: Hot chocolate. Some cocoas, some instant coffees, instant iced teas, powdered fruit drinks (read labels).  Condiments  Allowed: Soy sauce, carob powder, olives, gravy made with water, baker's cocoa, pickles, pure seasonings and spices, wine, pure monosodium glutamate, catsup, mustard.   Avoid: Some chewing gums, chocolate, some cocoas. Certain antibiotics and vitamin or mineral preparations. Spice blends if they contain milk products. MSG extender. Artificial sweeteners that contain lactose.  Some nondairy creamers (read labels).  SAMPLE MENU Breakfast  Orange juice.   Banana.   Bran cereal.   Nondairy creamer.   Vienna bread, toasted.   Butter or milk-free margarine.   Coffee or tea.  Lunch  Chicken breast.   Rice.   Green beans.   Butter or milk-free margarine.   Fresh melon.   Coffee or tea.  Dinner  [employer].   Baked potato.   Butter or milk-free margarine.   Broccoli.   Lettuce salad with vinegar and oil dressing.   [employer].   Coffee or tea.  Document Released: 03/13/2002 Document Revised: 06/03/2011 Document Reviewed: 12/19/2010 [facility] Patient Information 2012 [place], [place].

## 2011-09-18 NOTE — Progress Notes (Signed)
Primary Care Physician: Colette Ribas, MD  Primary Gastroenterologist:  Roetta Sessions, MD   Chief Complaint  Patient presents with  . Follow-up    question     HPI: Robin [name] is a 55 y.o. female here for f/u. She had abnormal terminal ileum on colonoscopy thought to be due to NSAIDS. Was on ibuprofen but forgot to tell us she was also on celebrex (started end of 07/2011). We had her stop ibuprofen but didn't know she was on the celebrex for severe knee pain. She "cannot live without it.". States dysphagia resolved. Heartburn better. No blood in stool or melena. Noticed she cannot tolerate dairy products. Causes diarrhea, bloating, cramps.    Current Outpatient Prescriptions  Medication Sig Dispense Refill  . aspirin 81 MG tablet Take 81 mg by mouth daily.        Marland Kitchen BENICAR HCT 40-12.5 MG per tablet Take 1 tablet by mouth daily.      Marland Kitchen buPROPion (WELLBUTRIN XL) 300 MG 24 hr tablet Take 300 mg by mouth every morning.       . celecoxib (CELEBREX) 200 MG capsule Take 200 mg by mouth 2 (two) times daily.        . colesevelam (WELCHOL) 625 MG tablet Take 1,250 mg by mouth 2 (two) times daily with a meal.       . DEXILANT 60 MG capsule Take 60 mg by mouth daily.       Marland Kitchen estradiol (ESTRACE) 1 MG tablet Take 1 mg by mouth daily.       Marland Kitchen levocetirizine (XYZAL) 5 MG tablet Take 5 mg by mouth daily.        . Multiple Vitamin (MULTIVITAMIN) tablet Take 1 tablet by mouth daily.          Allergies as of 09/18/2011  . (No Known Allergies)    ROS:  General: Negative for anorexia, weight loss, fever, chills, fatigue, weakness. ENT: Negative for hoarseness, difficulty swallowing , nasal congestion. CV: Negative for chest pain, angina, palpitations, dyspnea on exertion, peripheral edema.  Respiratory: Negative for dyspnea at rest, dyspnea on exertion, cough, sputum, wheezing.  GI: See history of present illness. GU:  Negative for dysuria, hematuria, urinary incontinence, urinary frequency,  nocturnal urination.  Endo: Negative for unusual weight change.    Physical Examination:   BP 134/83  Pulse 75  Temp(Src) 97.2 F (36.2 C) (Temporal)  Ht 5' 8.5" (1.74 m)  Wt 276 lb 3.2 oz (125.283 kg)  BMI 41.39 kg/m2  General: Well-nourished, well-developed in no acute distress.  Eyes: No icterus. Mouth: Oropharyngeal mucosa moist and pink , no lesions erythema or exudate. Lungs: Clear to auscultation bilaterally.  Heart: Regular rate and rhythm, no murmurs rubs or gallops.  Abdomen: Bowel sounds are normal, minimal diffuse lower abdominal tenderness, nondistended, no hepatosplenomegaly or masses, no abdominal bruits or hernia , no rebound or guarding.   Extremities: No lower extremity edema. No clubbing or deformities. Neuro: Alert and oriented x 4   Skin: Warm and dry, no jaundice.   Psych: Alert and cooperative, normal mood and affect.

## 2011-09-18 NOTE — Assessment & Plan Note (Signed)
Distal small bowel/terminal ileum with erosions/geographical ulceration at time of colonoscopy. At the time on chronic intermittent ibuprofen and recently started celebrex. Bx c/w nsaid induced changes but could not exclude ibd. To discuss with Dr. Jena Gauss. Patient does not want to stop celebrex as it is helping a lot with her osteoarthritis/fibromyalgia. Absolutely no OTHER nsaids for now.

## 2011-09-18 NOTE — Assessment & Plan Note (Signed)
No further weight loss noted.

## 2011-09-21 NOTE — Progress Notes (Signed)
Cc to PCP 

## 2011-10-07 NOTE — Progress Notes (Signed)
REVIEWED.  

## 2011-10-12 NOTE — Progress Notes (Signed)
Tried to call pt- LMOM 

## 2011-10-12 NOTE — Progress Notes (Signed)
Reminder in epic to follow up with RMR in 3 months

## 2011-10-12 NOTE — Progress Notes (Signed)
Please let patient know. Per RMR, if she absolutely needs Celebrex to function, she can use it but avoid all other NSAIDS.   OV with RMR in 3 months.

## 2011-10-12 NOTE — Progress Notes (Signed)
Pt aware, she said her insurance wont pay for celebrex anymore and she is now on meloxicam 15mg  and it doesn't help much.

## 2011-10-27 NOTE — Progress Notes (Signed)
Noted  

## 2011-10-27 NOTE — Progress Notes (Unsigned)
Pt called- requesting samples of dexilant and a probiotic. Gave her #3 bottles of dexilant and #10 of restora. Pt stated she was not allergic to fish oil or shell fish.

## 2011-11-11 ENCOUNTER — Ambulatory Visit (INDEPENDENT_AMBULATORY_CARE_PROVIDER_SITE_OTHER): Payer: Managed Care, Other (non HMO) | Admitting: Family Medicine

## 2011-11-11 VITALS — BP 152/89 | HR 87 | Temp 98.0°F | Resp 16 | Ht 67.0 in | Wt 277.0 lb

## 2011-11-11 DIAGNOSIS — J019 Acute sinusitis, unspecified: Secondary | ICD-10-CM

## 2011-11-11 DIAGNOSIS — R079 Chest pain, unspecified: Secondary | ICD-10-CM

## 2011-11-11 MED ORDER — AZITHROMYCIN 250 MG PO TABS: ORAL_TABLET | ORAL | Status: AC

## 2011-11-11 MED ORDER — FLUTICASONE PROPIONATE 50 MCG/ACT NA SUSP: 2.0000 | Freq: Every day | NASAL | Status: DC

## 2011-11-11 NOTE — Progress Notes (Signed)
Addended by: Lamar Laundry on: 11/11/2011 04:39 PM   Modules accepted: Orders

## 2011-11-11 NOTE — Progress Notes (Signed)
  Subjective:    Patient ID: Robin [name], female    DOB: 10/20/1955, 56 y.o.   MRN: [id number]  HPI 56 yo female with multiple complaints.  Lightheadedness, headache, nasal burning, sore throat, chest pain/burning.  Left ear aches.  Left arm aches.  Going on for 2-3 days.  Worsening.  Occasional cough, feels need to clear throat/chest.  No fever, n/v/d.  Sinus congestion with yellow discharge.  Bodyaches and worse in left arm.  Cough nonproductive, worse in morning - does cough up some phlegm then.  Not taking any OTC meds.   PCP = Dr Assunta Found   Review of Systems Negative except as per HPI     Objective:   Physical Exam  Constitutional: She appears well-developed. No distress.  HENT:  Right Ear: Tympanic membrane, external ear and ear canal normal. Tympanic membrane is not injected, not scarred, not perforated, not erythematous, not retracted and not bulging.  Left Ear: External ear and ear canal normal. Tympanic membrane is not injected, not scarred, not perforated, not erythematous, not retracted and not bulging. A middle ear effusion is present.  Nose: Mucosal edema present. No rhinorrhea. Right sinus exhibits no maxillary sinus tenderness and no frontal sinus tenderness. Left sinus exhibits frontal sinus tenderness. Left sinus exhibits no maxillary sinus tenderness.  Mouth/Throat: Uvula is midline, oropharynx is clear and moist and mucous membranes are normal. No oropharyngeal exudate or tonsillar abscesses.  Cardiovascular: Normal rate, regular rhythm, normal heart sounds and intact distal pulses.   No murmur heard. Pulmonary/Chest: Effort normal and breath sounds normal. No respiratory distress. She has no wheezes. She has no rales.  Lymphadenopathy:       Head (right side): No submandibular and no preauricular adenopathy present.       Head (left side): No submandibular and no preauricular adenopathy present.       Right cervical: No superficial cervical and no posterior  cervical adenopathy present.      Left cervical: No superficial cervical and no posterior cervical adenopathy present.       Right: No supraclavicular adenopathy present.       Left: No supraclavicular adenopathy present.  Skin: Skin is warm and dry.   CHest TTP - reproduces pain Abdomen tender in epigastrium   EKG with sinus with borderline QT.  Otherwise normal.    Assessment & Plan:  Constellation of symptoms, likely due to sinusitis.  Zpak, flonase.  If worsens or does not improve, RTC.

## 2011-11-13 ENCOUNTER — Telehealth: Payer: Self-pay

## 2011-11-13 NOTE — Telephone Encounter (Signed)
PT SAW DR Georgiana Shore ON WED AND RECEIVED ANTIBIOTIC.  NOW HAS YEAST INFECTION.  [id number] [place].  BEST # FOR PT [phone]

## 2011-11-14 MED ORDER — FLUCONAZOLE 150 MG PO TABS: 150.0000 mg | ORAL_TABLET | Freq: Once | ORAL | Status: AC

## 2011-11-14 NOTE — Telephone Encounter (Signed)
Pt requested med for yeast infection and this was sent to [facility].

## 2011-11-16 ENCOUNTER — Telehealth: Payer: Self-pay

## 2011-11-16 NOTE — Telephone Encounter (Signed)
.  [facility] PT WAS SEEN BY DR Georgiana Shore AND WOULD LIKE TO SPEAK WITH HER REGARDING HER VISIT PLEASE CALL [phone]

## 2011-11-17 NOTE — Telephone Encounter (Signed)
SPOKE WITH PT SHE WAS DXD WITH SINUS INFECTION BUT NOW SHE IS HAVING FLU LIKE SXS. SORE THROAT, BILATERAL EAR PAIN, AND CONGESTION. SHE IS TAKING ABX AND NOSE SPRAY. SHE CAN HARDLY BREATHE OUT HER NOSE. CAN WE CALL HER IN SOMETHING ELSE OR RTC?

## 2011-11-18 NOTE — Telephone Encounter (Signed)
Warm salt water gargles, Mucinex D are options.  She has flonase. RTC if these are not helpful.

## 2011-11-18 NOTE — Telephone Encounter (Signed)
Gave pt instructions from [place]. Pt will try and RTC if Sxs persist.

## 2011-12-09 NOTE — Progress Notes (Unsigned)
Patient ID: Robin [name], female   DOB: 1956-05-31, 56 y.o.   MRN: [id number] Pt called- requesting samples of dexilant. I put 3 bottles up front for her.

## 2011-12-09 NOTE — Progress Notes (Signed)
Noted  

## 2011-12-28 ENCOUNTER — Other Ambulatory Visit: Payer: Self-pay | Admitting: Gastroenterology

## 2012-01-05 ENCOUNTER — Encounter: Payer: Self-pay | Admitting: Internal Medicine

## 2012-01-18 ENCOUNTER — Other Ambulatory Visit: Payer: Self-pay | Admitting: Neurology

## 2012-01-18 DIAGNOSIS — R51 Headache: Secondary | ICD-10-CM

## 2012-01-22 ENCOUNTER — Ambulatory Visit
Admission: RE | Admit: 2012-01-22 | Discharge: 2012-01-22 | Disposition: A | Discharge status: Home / Self Care | Payer: Managed Care, Other (non HMO) | Source: Ambulatory Visit | Attending: Neurology | Admitting: Neurology

## 2012-01-22 DIAGNOSIS — R51 Headache: Secondary | ICD-10-CM

## 2012-01-27 ENCOUNTER — Other Ambulatory Visit: Payer: Managed Care, Other (non HMO)

## 2012-02-10 ENCOUNTER — Telehealth: Payer: Self-pay | Admitting: Gastroenterology

## 2012-02-10 NOTE — Telephone Encounter (Signed)
May have dexilant 60 mg 4 boxes

## 2012-02-10 NOTE — Telephone Encounter (Signed)
Left samples up front for her.

## 2012-02-10 NOTE — Telephone Encounter (Signed)
Pt called wanting Dexilant samples- she can be reached at [phone]

## 2012-02-11 ENCOUNTER — Other Ambulatory Visit: Payer: Self-pay | Admitting: Neurology

## 2012-02-11 DIAGNOSIS — H539 Unspecified visual disturbance: Secondary | ICD-10-CM

## 2012-02-11 DIAGNOSIS — R51 Headache: Secondary | ICD-10-CM

## 2012-02-11 DIAGNOSIS — E236 Other disorders of pituitary gland: Secondary | ICD-10-CM

## 2012-02-18 ENCOUNTER — Other Ambulatory Visit: Payer: Managed Care, Other (non HMO)

## 2012-02-19 ENCOUNTER — Inpatient Hospital Stay: Admission: RE | Admit: 2012-02-19 | Payer: Managed Care, Other (non HMO) | Source: Ambulatory Visit

## 2012-02-23 ENCOUNTER — Ambulatory Visit (INDEPENDENT_AMBULATORY_CARE_PROVIDER_SITE_OTHER): Payer: Managed Care, Other (non HMO) | Admitting: Internal Medicine

## 2012-02-23 ENCOUNTER — Encounter: Payer: Self-pay | Admitting: Internal Medicine

## 2012-02-23 VITALS — BP 127/75 | HR 64 | Temp 98.3°F | Ht 68.5 in | Wt 284.6 lb

## 2012-02-23 DIAGNOSIS — R194 Change in bowel habit: Secondary | ICD-10-CM

## 2012-02-23 DIAGNOSIS — R198 Other specified symptoms and signs involving the digestive system and abdomen: Secondary | ICD-10-CM

## 2012-02-23 NOTE — Progress Notes (Signed)
Primary Care Physician:  Colette Ribas, MD, MD Primary Gastroenterologist:  Dr. Jena Gauss   Pre-Procedure History & Physical: HPI:  Robin [name] is a 56 y.o. female here for followup of abdominal bloating and constipation. To have ileitis on colonoscopy with nonspecific inflammation on biopsies. These changes felt to be related to NSAID use. Patient actually continues using meloxicam. Switch to Celebrex but insurance would not pay for it. Uses meloxicam sparingly. Reflux symptoms well controlled on Dexilant. Dysphagia resolved status post dilation Schatzki's ring. She'll be due for routine colorectal cancer screening via colonoscopy in about 9 a half years. She feels that although she moves her bowels daily every other day she does not evacuate very well. She does not take a stool softener she has a very difficult time. She does not take a laxative. I note she has gained 8 pounds since her last office visit here. She is scheduled to see her gynecologist in the near future. She has a history of bladder tacking done in the past.  Past Medical History  Diagnosis Date  . GERD (gastroesophageal reflux disease)   . Constipation   . Anxiety disorder   . Hyperlipidemia   . Hypertension   . Chronic neck pain   . Schatzki's ring   . Hiatal hernia     Past Surgical History  Procedure Date  . Knee arthroscopy   . Cardiac catheterization   . Vaginal prolapse repair   . Rectocele repair   . Esophagogastroduodenoscopy 08/23/2008  . Colonoscopy 2006    Dr. Franky Macho, hyperplastic polyps  . Colonoscopy 08/06/11    abnormal terminal ileum for 10cm, erosions, geographical ulceration. Bx small bowel mucosa with prominent intramucosal lymphoid aggregates, slightly inflammed  . Esophagogastroduodenoscopy 08/06/11    small hh, noncritical Schatzki's ring s/p 57 F    Prior to Admission medications   Medication Sig Start Date End Date Taking? Authorizing Provider  aspirin 81 MG tablet Take 81 mg by  mouth daily.     Yes Historical Provider, MD  BENICAR HCT 40-12.5 MG per tablet Take 1 tablet by mouth daily. 05/09/11  Yes Historical Provider, MD  buPROPion (WELLBUTRIN XL) 300 MG 24 hr tablet Take 300 mg by mouth every morning.  09/08/11  Yes Historical Provider, MD  colesevelam (WELCHOL) 625 MG tablet Take 1,250 mg by mouth 2 (two) times daily with a meal.    Yes Historical Provider, MD  DEXILANT 60 MG capsule TAKE ONE CAPSULE EVERY DAY 30 MINUTES BEFORE BREAKFAST 12/28/11  Yes Joselyn Arrow, NP  estradiol (ESTRACE) 1 MG tablet Take 1 mg by mouth daily.  09/10/11  Yes Historical Provider, MD  fluticasone (FLONASE) 50 MCG/ACT nasal spray Place 2 sprays into the nose daily. 11/11/11 11/10/12 Yes Lamar Laundry, MD  gabapentin (NEURONTIN) 300 MG capsule Take 300 mg by mouth 2 (two) times daily.  02/11/12  Yes Historical Provider, MD  levocetirizine (XYZAL) 5 MG tablet Take 5 mg by mouth daily.     Yes Historical Provider, MD  meloxicam (MOBIC) 15 MG tablet Take 15 mg by mouth daily.  01/25/12  Yes Historical Provider, MD  Multiple Vitamin (MULTIVITAMIN) tablet Take 1 tablet by mouth daily.     Yes Historical Provider, MD  celecoxib (CELEBREX) 200 MG capsule Take 200 mg by mouth 2 (two) times daily.      Historical Provider, MD    Allergies as of 02/23/2012  . (No Known Allergies)    Family History  Problem Relation Age of  Onset  . Kidney cancer Father   . Stomach cancer      aunt  . Breast cancer      grandmother  . Colon cancer Neg Hx     History   Social History  . Marital Status: Married    Spouse Name: N/A    Number of Children: 4  . Years of Education: N/A   Occupational History  .     Social History Main Topics  . Smoking status: Former Smoker -- 0.5 packs/day    Types: Cigarettes  . Smokeless tobacco: Not on file   Comment: 17 yrs ago  . Alcohol Use: No  . Drug Use: No  . Sexually Active: Not on file   Other Topics Concern  . Not on file   Social History Narrative  .  No narrative on file    Review of Systems: See HPI, otherwise negative ROS  Physical Exam: BP 127/75  Pulse 64  Temp(Src) 98.3 F (36.8 C) (Temporal)  Ht 5' 8.5" (1.74 m)  Wt 284 lb 9.6 oz (129.094 kg)  BMI 42.64 kg/m2 General:   Alert,  Well-developed, well-nourished, pleasant and cooperative in NAD Skin:  Intact without significant lesions or rashes. Eyes:  Sclera clear, no icterus.   Conjunctiva pink. Ears:  Normal auditory acuity. Nose:  No deformity, discharge,  or lesions. Mouth:  No deformity or lesions. Neck:  Supple; no masses or thyromegaly. No significant cervical adenopathy. Lungs:  Clear throughout to auscultation.   No wheezes, crackles, or rhonchi. No acute distress. Heart:  Regular rate and rhythm; no murmurs, clicks, rubs,  or gallops. Abdomen: Obese. Non-distended, normal bowel sounds.  Soft and nontender without appreciable mass or hepatosplenomegaly.  Pulses:  Normal pulses noted. Extremities:  Without clubbing or edema.

## 2012-02-23 NOTE — Assessment & Plan Note (Signed)
Pleasant 56 year old lady with a Four-year history of difficulty evacuating. She feels that she does not have a good bowel movement on a regular basis. Has not passed any blood. Needs a stool softener to facilitate bowel function. She feels that her bladder tacking previously has something to do with her altered bowel habits. Reflux symptoms are well controlled on Dexilant. No longer having dysphagia.  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 She does not have any other symptoms. I do not feel that she has Crohn's ileitis.  Recommendations: In addition to utilizing a a stool softener daily. I recommended MiraLax 17 g orally at bedtime nightly on any given day without a good bowel movement. I recommended she discuss her bowel symptoms with her gynecologist but I am hard pressed to connect bladder tacking to her current bowel symptoms at this time. Minimizing use of nonsteroidal agents. Continue Dexilant 60 mg orally daily. Unless something comes up, we'll plan to see this nice lady back in the office in 6 months.

## 2012-02-23 NOTE — Patient Instructions (Signed)
Take miralax  1 capful at bedtime nightly if no good BM on any given day  Continue stool softener  Continue Dexilant   OV here in 6 months

## 2012-03-05 ENCOUNTER — Ambulatory Visit
Admission: RE | Admit: 2012-03-05 | Discharge: 2012-03-05 | Disposition: A | Discharge status: Home / Self Care | Payer: Managed Care, Other (non HMO) | Source: Ambulatory Visit | Attending: Neurology | Admitting: Neurology

## 2012-03-05 DIAGNOSIS — H539 Unspecified visual disturbance: Secondary | ICD-10-CM

## 2012-03-05 DIAGNOSIS — R51 Headache: Secondary | ICD-10-CM

## 2012-03-05 DIAGNOSIS — E236 Other disorders of pituitary gland: Secondary | ICD-10-CM

## 2012-03-05 MED ORDER — GADOBENATE DIMEGLUMINE 529 MG/ML IV SOLN
14.0000 mL | Freq: Once | INTRAVENOUS | Status: AC | PRN
  Administered 2012-03-05: 14 mL via INTRAVENOUS

## 2012-03-14 ENCOUNTER — Encounter (HOSPITAL_COMMUNITY): Payer: Self-pay | Admitting: Pharmacy Technician

## 2012-03-14 ENCOUNTER — Encounter (HOSPITAL_COMMUNITY): Payer: Self-pay | Admitting: *Deleted

## 2012-03-14 NOTE — Progress Notes (Addendum)
Spoke with Amy, Dr. Raye Sorrow office, requested orders.   Requested sleep study, PFTs from [facility]. Requested last OV note, any cardiac tests from Dr. Hazle Coca office.

## 2012-03-14 NOTE — H&P (Signed)
Robin [name], Robin [name] 56 y.o., female [id number]     Chief Complaint: Pharyngeal foreign body  HPI: 56 year old black female was flossing her teeth and noted that the floss came back out with the tip missing.  She feels like it lodged in her RIGHT throat and points to the laryngeal level.  It continues to poke her each time she swallows now for 4 days.  She tried to swallow bread, etc. to clear it, without success.  No breathing difficulty.  No actual swallowing difficulty.  No aspiration.  No blood.  No prior similar events.  She does have a history of sleep apnea and reflux.   She describes periodic sharp pains in her right ear which we apparently evaluated her for previously.  She has a bad tooth RIGHT mandibular which she claims is infected.  She is doing most of her chewing on the LEFT side.  She does not have a distinct history of bruxism or TMJ problems.     Finally she describes recent evaluation with Dr. Gerilyn Pilgrim in [place] revealing a pituitary tumor.  She is scheduled to see the doctors at [facility].  PMH: Past Medical History  Diagnosis Date  . GERD (gastroesophageal reflux disease)   . Constipation   . Anxiety disorder   . Hyperlipidemia   . Hypertension   . Chronic neck pain   . Schatzki's ring   . Hiatal hernia   . Sleep apnea     CPAP, Sleep study at [facility]  . Arthritis     Surg Hx: Past Surgical History  Procedure Date  . Knee arthroscopy   . Cardiac catheterization   . Vaginal prolapse repair   . Rectocele repair   . Esophagogastroduodenoscopy 08/23/2008  . Colonoscopy 2006    Dr. Franky Macho, hyperplastic polyps  . Colonoscopy 08/06/11    abnormal terminal ileum for 10cm, erosions, geographical ulceration. Bx small bowel mucosa with prominent intramucosal lymphoid aggregates, slightly inflammed  . Esophagogastroduodenoscopy 08/06/11    small hh, noncritical Schatzki's ring s/p 58 F  . Abdominal hysterectomy     FHx:   Family  History  Problem Relation Age of Onset  . Kidney cancer Father   . Stomach cancer      aunt  . Breast cancer      grandmother  . Colon cancer Neg Hx    SocHx:  reports that she has quit smoking. Her smoking use included Cigarettes. She smoked .5 packs per day. She does not have any smokeless tobacco history on file. She reports that she does not drink alcohol or use illicit drugs.  ALLERGIES: No Known Allergies  No prescriptions prior to admission    No results found for this or any previous visit (from the past 48 hour(s)). No results found.  ROS: non contrib  There were no vitals taken for this visit.  PHYSICAL EXAM:She is heavyset.  She is not distressed.  Voice is clear and respirations unlabored through the nose.  She is not clearing her throat.  The head is atraumatic and neck supple.  She hears well in conversational speech.  Ear canals are to clean.  She has some RIGHT TMJ tenderness without crepitus.  Anterior nose is moist and patent both sides.  Oral cavity shows several missing teeth.  One of her remaining RIGHT mandibular teeth is mildly tender to percussion.  No visible lesions.  Oropharynx shows 2+ large embedded and cryptic tonsils.  On palpation, she notes some reproduction of  her pain syndrome but I do not feel a foreign body.  I do not see any cryptic debris.  No palpable styloid process on either side.  Neck without adenopathy.  Normal laryngeal crepitus.   Using the flexible laryngoscope, the nasopharynx is clear.  Oropharynx is clear with slightly prominent lingual tonsillar tissue and tonsils as above.  No visible foreign body in the base of tongue, tonsil, or vallecula.  Hypopharynx/larynx normal with no foreign bodies in the piriform sinuses.  Vocal cords are fully mobile.  Airway is good.  No contact ulcers. Lungs:  Clear to auscultationi Heart:  RRR, no murmurs Abd:   Soft, active Ext:  Symmetric, nl Neuro:  Intact, symmetric    Assessment/Plan .  Heartburn   (787.1) . Foreign body in the pharynx   (933.0) . Adult sleep apnea   (780.57)  I'm afraid the plastic tip that you swallowed is still stuck in your throat, because it keeps poking you.  You do have reflux, and moderately large and deeply pocketed tonsils which could be confusing this issue to some degree.  I recommend we go ahead and look down your throat under anesthesia tomorrow to find this and get rid of it.  It is possible I will not find it.  If it gets into your stomach, it will go all the way through without difficulty.  Recheck here 1 week after surgery.    Flo Shanks 03/14/2012, 5:44 PM

## 2012-03-15 ENCOUNTER — Encounter (HOSPITAL_COMMUNITY): Payer: Self-pay | Admitting: *Deleted

## 2012-03-15 ENCOUNTER — Encounter (HOSPITAL_COMMUNITY): Payer: Self-pay | Admitting: Anesthesiology

## 2012-03-15 ENCOUNTER — Encounter (HOSPITAL_COMMUNITY)
Admission: RE | Disposition: A | Discharge status: Home / Self Care | Payer: Self-pay | Source: Ambulatory Visit | Attending: Otolaryngology

## 2012-03-15 ENCOUNTER — Ambulatory Visit (HOSPITAL_COMMUNITY): Payer: Managed Care, Other (non HMO) | Admitting: Anesthesiology

## 2012-03-15 ENCOUNTER — Ambulatory Visit (HOSPITAL_COMMUNITY): Payer: Managed Care, Other (non HMO)

## 2012-03-15 ENCOUNTER — Ambulatory Visit (HOSPITAL_COMMUNITY)
Admission: RE | Admit: 2012-03-15 | Discharge: 2012-03-15 | Disposition: A | Discharge status: Home / Self Care | Payer: Managed Care, Other (non HMO) | Source: Ambulatory Visit | Attending: Otolaryngology | Admitting: Otolaryngology

## 2012-03-15 DIAGNOSIS — T189XXA Foreign body of alimentary tract, part unspecified, initial encounter: Secondary | ICD-10-CM

## 2012-03-15 DIAGNOSIS — K449 Diaphragmatic hernia without obstruction or gangrene: Secondary | ICD-10-CM | POA: Insufficient documentation

## 2012-03-15 DIAGNOSIS — K219 Gastro-esophageal reflux disease without esophagitis: Secondary | ICD-10-CM | POA: Insufficient documentation

## 2012-03-15 DIAGNOSIS — I10 Essential (primary) hypertension: Secondary | ICD-10-CM | POA: Insufficient documentation

## 2012-03-15 DIAGNOSIS — G4730 Sleep apnea, unspecified: Secondary | ICD-10-CM | POA: Insufficient documentation

## 2012-03-15 DIAGNOSIS — R6889 Other general symptoms and signs: Secondary | ICD-10-CM | POA: Insufficient documentation

## 2012-03-15 HISTORY — DX: Unspecified osteoarthritis, unspecified site: M19.90

## 2012-03-15 HISTORY — PX: DIRECT LARYNGOSCOPY: SHX5326

## 2012-03-15 HISTORY — DX: Sleep apnea, unspecified: G47.30

## 2012-03-15 HISTORY — PX: ESOPHAGOSCOPY: SHX5534

## 2012-03-15 LAB — BASIC METABOLIC PANEL
BUN: 14 mg/dL (ref 6–23)
Calcium: 9.3 mg/dL (ref 8.4–10.5)
Chloride: 103 mEq/L (ref 96–112)
Creatinine, Ser: 0.84 mg/dL (ref 0.50–1.10)
GFR calc Af Amer: >90 mL/min (ref >90)

## 2012-03-15 LAB — SURGICAL PCR SCREEN: MRSA, PCR: NEGATIVE

## 2012-03-15 LAB — CBC
HCT: 40 % (ref 36.0–46.0)
MCHC: 32.3 g/dL (ref 30.0–36.0)
MCV: 89.9 fL (ref 78.0–100.0)
Platelets: 347 10*3/uL (ref 150–400)
RDW: 12.7 % (ref 11.5–15.5)

## 2012-03-15 SURGERY — LARYNGOSCOPY, DIRECT
Anesthesia: General | Site: Throat | Wound class: Clean Contaminated

## 2012-03-15 MED ORDER — FENTANYL CITRATE 0.05 MG/ML IJ SOLN
INTRAMUSCULAR | Status: DC | PRN
  Administered 2012-03-15: 100 ug via INTRAVENOUS

## 2012-03-15 MED ORDER — PROPOFOL 10 MG/ML IV EMUL
INTRAVENOUS | Status: DC | PRN
  Administered 2012-03-15: 320 mg via INTRAVENOUS

## 2012-03-15 MED ORDER — SUCCINYLCHOLINE CHLORIDE 20 MG/ML IJ SOLN
INTRAMUSCULAR | Status: DC | PRN
  Administered 2012-03-15: 50 mg via INTRAVENOUS
  Administered 2012-03-15: 40 mg via INTRAVENOUS
  Administered 2012-03-15: 150 mg via INTRAVENOUS
  Administered 2012-03-15: 40 mg via INTRAVENOUS

## 2012-03-15 MED ORDER — FENTANYL CITRATE 0.05 MG/ML IJ SOLN: 25.0000 ug | INTRAMUSCULAR | Status: DC | PRN

## 2012-03-15 MED ORDER — LIDOCAINE HCL (CARDIAC) 20 MG/ML IV SOLN
INTRAVENOUS | Status: DC | PRN
  Administered 2012-03-15: 100 mg via INTRAVENOUS

## 2012-03-15 MED ORDER — LIDOCAINE HCL 4 % MT SOLN
OROMUCOSAL | Status: DC | PRN
  Administered 2012-03-15: 4 mL via TOPICAL

## 2012-03-15 MED ORDER — MIDAZOLAM HCL 5 MG/5ML IJ SOLN
INTRAMUSCULAR | Status: DC | PRN
  Administered 2012-03-15: 2 mg via INTRAVENOUS

## 2012-03-15 MED ORDER — MUPIROCIN 2 % EX OINT
TOPICAL_OINTMENT | Freq: Two times a day (BID) | CUTANEOUS | Status: DC
  Administered 2012-03-15: 1 via NASAL
  Filled 2012-03-15 (×2): qty 22

## 2012-03-15 MED ORDER — LACTATED RINGERS IV SOLN
INTRAVENOUS | Status: DC | PRN
  Administered 2012-03-15: 09:00:00 via INTRAVENOUS

## 2012-03-15 MED ORDER — ONDANSETRON HCL 4 MG/2ML IJ SOLN
INTRAMUSCULAR | Status: DC | PRN
  Administered 2012-03-15: 4 mg via INTRAVENOUS

## 2012-03-15 MED ORDER — LACTATED RINGERS IV SOLN
INTRAVENOUS | Status: DC
  Administered 2012-03-15: 09:00:00 via INTRAVENOUS

## 2012-03-15 SURGICAL SUPPLY — 28 items
BALLN PULM 15 16.5 18X75 (BALLOONS)
BALLOON PULM 15 16.5 18X75 (BALLOONS) ×2 IMPLANT
CLOTH BEACON ORANGE TIMEOUT ST (SAFETY) ×3 IMPLANT
CONT SPEC 4OZ CLIKSEAL STRL BL (MISCELLANEOUS) ×4 IMPLANT
COVER TABLE BACK 60X90 (DRAPES) ×3 IMPLANT
CRADLE DONUT ADULT HEAD (MISCELLANEOUS) IMPLANT
DRAPE ORTHO SPLIT 77X108 STRL (DRAPES) ×3
DRAPE PROXIMA HALF (DRAPES) ×3 IMPLANT
DRAPE SURG ORHT 6 SPLT 77X108 (DRAPES) IMPLANT
GLOVE BIO SURGEON STRL SZ 6.5 (GLOVE) ×1 IMPLANT
GLOVE BIOGEL PI IND STRL 7.0 (GLOVE) IMPLANT
GLOVE BIOGEL PI INDICATOR 7.0 (GLOVE) ×1
GLOVE ECLIPSE 8.0 STRL XLNG CF (GLOVE) ×3 IMPLANT
GUARD TEETH (MISCELLANEOUS) ×3 IMPLANT
KIT ROOM TURNOVER OR (KITS) ×3 IMPLANT
MARKER SKIN DUAL TIP RULER LAB (MISCELLANEOUS) IMPLANT
NS IRRIG 1000ML POUR BTL (IV SOLUTION) ×2 IMPLANT
PAD ARMBOARD 7.5X6 YLW CONV (MISCELLANEOUS) ×6 IMPLANT
PATTIES SURGICAL .5 X3 (DISPOSABLE) IMPLANT
SPECIMEN JAR SMALL (MISCELLANEOUS) ×2 IMPLANT
SPONGE GAUZE 4X4 12PLY (GAUZE/BANDAGES/DRESSINGS) ×3 IMPLANT
SPONGE INTESTINAL PEANUT (DISPOSABLE) IMPLANT
SURGILUBE 2OZ TUBE FLIPTOP (MISCELLANEOUS) ×3 IMPLANT
SYR INFLATE BILIARY GAUGE (MISCELLANEOUS) ×2 IMPLANT
TOWEL OR 17X24 6PK STRL BLUE (TOWEL DISPOSABLE) ×6 IMPLANT
TRAP SPECIMEN MUCOUS 40CC (MISCELLANEOUS) IMPLANT
TUBE CONNECTING 12X1/4 (SUCTIONS) ×3 IMPLANT
WATER STERILE IRR 1000ML POUR (IV SOLUTION) ×2 IMPLANT

## 2012-03-15 NOTE — Discharge Instructions (Signed)
Swallowed Foreign Body, Adult You have swallowed an object (foreign body). Once the foreign body has passed through the food tube (esophagus), which leads from the mouth to the stomach, it will usually continue through the body without problems. This is because the point where the esophagus enters into the stomach is the narrowest place through which the foreign body must pass. Sometimes the foreign body gets stuck. The most common type of foreign body obstruction in adults is food impaction. Many times, bones from fish or meat products may become lodged in the esophagus or injure the throat on the way down. When there is an object that obstructs the esophagus, the most obvious symptoms are pain and the inability to swallow normally. In some cases, foreign bodies that can be life threatening are swallowed. Examples of these are certain medications and illicit drugs. Often in these instances, patients are afraid of telling what they swallowed. However, it is extremely important to tell the emergency caregiver what was swallowed because life-saving treatment may be needed.  X-ray exams may be taken to find the location of the foreign body. However, some objects do not show up well or may be too small to be seen on an X-ray image. If the foreign body is too large or too sharp, it may be too dangerous to allow it to pass on its own. You may need to see a caregiver who specializes in the digestive system (gastroenterologist). In a few cases, a specialist may need to remove the object using a method called "endoscopy". This involves passing a thin, soft, flexible tube into the food pipe to locate and remove the object. Follow up with your primary doctor or the referral you were given by the emergency caregiver. HOME CARE INSTRUCTIONS   If your caregiver says it is safe for you to eat, then only have liquids and soft foods until your symptoms improve.   Once you are eating normally:   Cut food into small pieces.    Remove small bones from food.   Remove large seeds and pits from fruit.   Chew your food well.   Do not talk, laugh, or engage in physical activity while eating or swallowing.  SEEK MEDICAL CARE IF:  You develop worsening shortness of breath, uncontrollable coughing, chest pains or high fever, greater than 102 F (38.9 C).   You are unable to eat or drink or you feel that food is getting stuck in your throat.   You have choking symptoms or cannot stop drooling.   You develop abdominal pain, vomiting (especially of blood), or rectal bleeding.  MAKE SURE YOU:   Understand these instructions.   Will watch your condition.   Will get help right away if you are not doing well or get worse.  Document Released: 03/11/2010 Document Revised: 09/10/2011 Document Reviewed: 03/11/2010 [facility] Patient Information 2012 [place], [place].  You are OK to advance diet as comfortable.  Resume normal activities tomorrow.   Recheck my office 2 weeks, sooner if you still feel like there is something down there.

## 2012-03-15 NOTE — Anesthesia Preprocedure Evaluation (Addendum)
Anesthesia Evaluation  Patient identified by MRN, date of birth, ID band Patient awake    Reviewed: Allergy & Precautions, H&P , NPO status , Patient's Chart, lab work & pertinent test results  Airway Mallampati: II TM Distance: >3 FB Neck ROM: Full    Dental No notable dental hx. (+) Teeth Intact and Dental Advisory Given   Pulmonary neg pulmonary ROS, sleep apnea and Continuous Positive Airway Pressure Ventilation ,  breath sounds clear to auscultation  Pulmonary exam normal       Cardiovascular hypertension, On Medications and Pt. on medications Rhythm:Regular Rate:Normal     Neuro/Psych PSYCHIATRIC DISORDERS  Neuromuscular disease    GI/Hepatic Neg liver ROS, hiatal hernia, GERD-  Medicated and Controlled,  Endo/Other  Morbid obesity  Renal/GU negative Renal ROS  negative genitourinary   Musculoskeletal   Abdominal (+) + obese,   Peds  Hematology negative hematology ROS (+)   Anesthesia Other Findings   Reproductive/Obstetrics negative OB ROS                          Anesthesia Physical Anesthesia Plan  ASA: III  Anesthesia Plan: General   Post-op Pain Management:    Induction: Intravenous  Airway Management Planned: Oral ETT  Additional Equipment:   Intra-op Plan:   Post-operative Plan: Extubation in OR  Informed Consent: I have reviewed the patients History and Physical, chart, labs and discussed the procedure including the risks, benefits and alternatives for the proposed anesthesia with the patient or authorized representative who has indicated his/her understanding and acceptance.   Dental advisory given  Plan Discussed with: CRNA  Anesthesia Plan Comments:         Anesthesia Quick Evaluation

## 2012-03-15 NOTE — Anesthesia Postprocedure Evaluation (Signed)
  Anesthesia Post-op Note  Patient: Robin [name]  Procedure(s) Performed: Procedure(s) (LRB): DIRECT LARYNGOSCOPY (N/A) ESOPHAGOSCOPY (N/A)  Patient Location: PACU  Anesthesia Type: General  Level of Consciousness: awake  Airway and Oxygen Therapy: Patient Spontanous Breathing and Patient connected to nasal cannula oxygen  Post-op Pain: mild  Post-op Assessment: Post-op Vital signs reviewed, Patient's Cardiovascular Status Stable, Respiratory Function Stable, Patent Airway and No signs of Nausea or vomiting  Post-op Vital Signs: Reviewed and stable  Complications: No apparent anesthesia complications

## 2012-03-15 NOTE — Transfer of Care (Signed)
Immediate Anesthesia Transfer of Care Note  Patient: Robin [name]  Procedure(s) Performed: Procedure(s) (LRB): DIRECT LARYNGOSCOPY (N/A) ESOPHAGOSCOPY (N/A)  Patient Location: PACU  Anesthesia Type: General  Level of Consciousness: awake  Airway & Oxygen Therapy: Patient Spontanous Breathing and Patient connected to nasal cannula oxygen  Post-op Assessment: Report given to PACU RN, Post -op Vital signs reviewed and stable and Patient moving all extremities  Post vital signs: Reviewed and stable  Complications: No apparent anesthesia complications

## 2012-03-15 NOTE — Preoperative (Signed)
Beta Blockers   Reason not to administer Beta Blockers:Not Applicable 

## 2012-03-15 NOTE — Interval H&P Note (Signed)
History and Physical Interval Note:  03/15/2012 8:26 AM  Robin [name]  has presented today for surgery, with the diagnosis of RIGHT PHARYNGEAL FOREIGN BODY  The various methods of treatment have been discussed with the patient and family. After consideration of risks, benefits and other options for treatment, the patient has consented to  Procedure(s) (LRB): DIRECT LARYNGOSCOPY (N/A) ESOPHAGOSCOPY (N/A) as a surgical intervention .  The patient's history has been re-reviewed, patient re-examined, no change in status, stable for surgery.  I have re-reviewed the patient's chart and labs.  Questions were answered to the patient's satisfaction.     Flo Shanks

## 2012-03-15 NOTE — Op Note (Signed)
03/15/2012  10:52 AM    Robin [name]  [id number]   Pre-Op Dx:  Pharyngeal foreign body  Post-op Dx: same  Proc: Direct Laryngoscopy, Esophagoscopy   Surg:  Cephus Richer MD  Anes:  GOT  EBL:  Min.  Comp:  none  Findings:  Redundant pharyngeal soft tissues.  1-2+ embedded tonsils bilat.  No obvious cryptic debris. Mod. Lingual tonsils.  No visualized foreign body.  Nl larynx.  Nl esophagus to approx 40 cm.  No foreign body in oropharynx or hypopharynx to limits of digital palpation.  Procedure: With the patient in a comfortable supine position, GOT anesthesia was induced without difficulty.  At an appropriate level, the table was turned 90 degrees away from Anesthesia.  A clean preparation and draping was performed in the standard fashion.  A rubber tooth guard was placed.  The anterior commissure laryngoscope was introduced taking care to protect lips, teeth, and endotracheal tube.  Complete laryngoscopy was performed in the standard fashion. Especial attention was placed on the lingual tonsils and faucial tonsils. The findings were as described above.  The laryngoscope was removed.  The cervical laryngoscope was lubricated and inserted into the hypopharynx.  With gentle pressure, it was passed through the cricopharyngeus and advanced to its full length without difficulty with the findings as described above.  It was removed.  The full length esophagoscope was lubricated and introduced.  It was passed to approximately 40 cm without significant findings.  The oropharynx, oral cavity, nasopharynx and hypopharynx were palpated with findings as described above.  The laryngoscope was re-introduced and inspection of the oropharynx and hypopharynx/larynx was repeated.  No foreign body was indentified.   At this point the procedure was completed. The rubber tooth guard was removed.   Dental status was intact.  The patient was returned to Anesthesia, awakened, extubated, and  transferred to PACU in satisfactory condition.  Comment:  No foreign body identified. This could represent a foreign body embedded in the tonsil .  It could be tonsillar cryptic debris with foreign body sensation.  It could be focal irritation aggravated by reflux.  If she has persistent foreign body sensation, we will attempt to evaluate further, possibly including a CT neck.  Dispo:   PACU to home  Plan:  Advance diet.  Resume all normal activities tomorrow.  Continue anti-reflux treatment.  Cephus Richer MD

## 2012-03-17 ENCOUNTER — Encounter (HOSPITAL_COMMUNITY): Payer: Self-pay | Admitting: Otolaryngology

## 2012-04-04 DIAGNOSIS — D352 Benign neoplasm of pituitary gland: Secondary | ICD-10-CM | POA: Insufficient documentation

## 2012-04-05 ENCOUNTER — Other Ambulatory Visit: Payer: Self-pay | Admitting: Otolaryngology

## 2012-04-05 DIAGNOSIS — T17208A Unspecified foreign body in pharynx causing other injury, initial encounter: Secondary | ICD-10-CM

## 2012-04-08 ENCOUNTER — Other Ambulatory Visit: Payer: Managed Care, Other (non HMO)

## 2012-04-08 ENCOUNTER — Ambulatory Visit
Admission: RE | Admit: 2012-04-08 | Discharge: 2012-04-08 | Disposition: A | Discharge status: Home / Self Care | Payer: Managed Care, Other (non HMO) | Source: Ambulatory Visit | Attending: Otolaryngology | Admitting: Otolaryngology

## 2012-04-08 DIAGNOSIS — T17208A Unspecified foreign body in pharynx causing other injury, initial encounter: Secondary | ICD-10-CM

## 2012-04-08 MED ORDER — IOHEXOL 300 MG/ML  SOLN
75.0000 mL | Freq: Once | INTRAMUSCULAR | Status: AC | PRN
  Administered 2012-04-08: 75 mL via INTRAVENOUS

## 2012-04-12 ENCOUNTER — Telehealth: Payer: Self-pay | Admitting: Internal Medicine

## 2012-04-12 NOTE — Telephone Encounter (Signed)
#  3 boxes of dexilant given.

## 2012-04-12 NOTE — Telephone Encounter (Signed)
Pt called to see if she could get Dexilant samples.

## 2012-06-05 HISTORY — PX: PITUITARY SURGERY: SHX203

## 2012-06-09 DIAGNOSIS — F419 Anxiety disorder, unspecified: Secondary | ICD-10-CM | POA: Insufficient documentation

## 2012-06-09 DIAGNOSIS — E785 Hyperlipidemia, unspecified: Secondary | ICD-10-CM | POA: Insufficient documentation

## 2012-06-09 DIAGNOSIS — Z531 Procedure and treatment not carried out because of patient's decision for reasons of belief and group pressure: Secondary | ICD-10-CM | POA: Insufficient documentation

## 2012-06-09 DIAGNOSIS — Z8719 Personal history of other diseases of the digestive system: Secondary | ICD-10-CM | POA: Insufficient documentation

## 2012-06-27 ENCOUNTER — Ambulatory Visit: Payer: Managed Care, Other (non HMO) | Admitting: Gastroenterology

## 2012-07-04 ENCOUNTER — Ambulatory Visit (INDEPENDENT_AMBULATORY_CARE_PROVIDER_SITE_OTHER): Payer: Managed Care, Other (non HMO) | Admitting: Gastroenterology

## 2012-07-04 ENCOUNTER — Encounter: Payer: Self-pay | Admitting: Gastroenterology

## 2012-07-04 VITALS — BP 131/68 | HR 75 | Temp 97.9°F | Ht 68.0 in | Wt 274.8 lb

## 2012-07-04 DIAGNOSIS — R1013 Epigastric pain: Secondary | ICD-10-CM

## 2012-07-04 DIAGNOSIS — R300 Dysuria: Secondary | ICD-10-CM

## 2012-07-04 DIAGNOSIS — K219 Gastro-esophageal reflux disease without esophagitis: Secondary | ICD-10-CM

## 2012-07-04 DIAGNOSIS — G8929 Other chronic pain: Secondary | ICD-10-CM | POA: Insufficient documentation

## 2012-07-04 MED ORDER — SUCRALFATE 1 GM/10ML PO SUSP: 1.0000 g | Freq: Three times a day (TID) | ORAL | Status: DC

## 2012-07-04 NOTE — Assessment & Plan Note (Signed)
Check u/a with culture reflex.

## 2012-07-04 NOTE — Progress Notes (Signed)
Faxed to PCP

## 2012-07-04 NOTE — Patient Instructions (Addendum)
Continue Dexilant 60mg  twice a day before meals for next four weeks. Samples provided. Collect urine specimen and drop off at lab. Start Carafate four times a day for next 10 days for heartburn, stomach burning. Call in two weeks if not better.

## 2012-07-04 NOTE — Assessment & Plan Note (Addendum)
Several weeks of increased heartburn, epigastric burning in the setting of increased ibuprofen use, likely diminished GI motility postoperatively. Some improvement with twice a day Dexilant. Patient slow to recover. We'll continue twice a day PPI at least for the next 4 weeks. 30 days of samples of Dexilant provided. Hold NSAIDs as long as possible but if necessary gradually add back Mobic only. Avoid OTC NSAIDS. Add Carafate for ten days. PR in two weeks if no better.  Reviewed laryngoscopy from 03/2012: no foreign body seen. Redundant pharyngeal soft tissues. 1-2+ embedded tonsils bilat. No obvious cryptic debris. Mod. Lingual tonsils. No visualized foreign body. Nl larynx. Nl esophagus to approx 40 cm. No foreign body in oropharynx or hypopharynx to limits of digital palpation.

## 2012-07-04 NOTE — Progress Notes (Signed)
Primary Care Physician: Colette Ribas, MD  Primary Gastroenterologist:  Roetta Sessions, MD   Chief Complaint  Patient presents with  . Abdominal Pain  . Gastrophageal Reflux    HPI: Robin [name] is a 56 y.o. female here for f/u abdominal pain and GERD. She has h/o nonspecific inflammation of ileum on TCS/TI felt to be related to NSAIDS use. She will be due for TCS for colorectal cancer screening in 08/2021. She had EGD/ED in 08/2011 for noncritical Schatzki ring. H/O difficulty evacuating stool for several years.   Weight is down from 284 at last OV.  Since her last office visit here she went to see Dr. Lazarus Salines in June after having a plastic part of a toothpick become lodged in the back of her throat. She states that she underwent laryngoscopy/EGD at day surgery without any significant findings. She also underwent pituitary tumor surgery a couple of weeks ago for benign tumor.   Before pituitary tumor resection she began having more heartburn and stomach issues. Worse after surgery. Heartburn, epigastric pain, throat/chest on fire. She is not sure whether she received her Dexilant while in the hospital. She also notes that she was given pantoprazole twice a day after seeing the ENT but she quickly had to change back to the Dexilant because it was not as good. Added zantac 300mg  after her surgery without relief. Not really taking Mobic, was taken off after surgery. Has been off for a couple of weeks. She also notes that she was taking ibuprofen for increased headaches prior to her surgery. Postoperatively she was unable to eat for several days. Complain of postprandial abdominal pain and heartburn. Gradually has been able to add back food. Still having lots of heartburn. She increased her Dexilant to twice a day for the past week and is feeling some better. Denies dysphagia.  Since Friday she has had increased urinary frequency and dysuria. Denies fever.   First post-op surgery is next  week.   BM constipation for awhile but better now. Worse epigastric pain, burning with meals.   Current Outpatient Prescriptions  Medication Sig Dispense Refill  . aspirin 81 MG tablet Take 81 mg by mouth daily.        Marland Kitchen BENICAR HCT 40-12.5 MG per tablet Take 1 tablet by mouth daily.      Marland Kitchen buPROPion (WELLBUTRIN XL) 300 MG 24 hr tablet Take 300 mg by mouth every morning.       . colesevelam (WELCHOL) 625 MG tablet Take 1,250 mg by mouth 2 (two) times daily with a meal.       . dexlansoprazole (DEXILANT) 60 MG capsule Take 60 mg by mouth daily.      Marland Kitchen estradiol (ESTRACE) 1 MG tablet Take 1 mg by mouth daily.       . fluticasone (FLONASE) 50 MCG/ACT nasal spray Place 2 sprays into the nose daily as needed. For congestion      . gabapentin (NEURONTIN) 300 MG capsule Take 300 mg by mouth 2 (two) times daily.       Marland Kitchen HYDROcodone-acetaminophen (NORCO/VICODIN) 5-325 MG per tablet       . levocetirizine (XYZAL) 5 MG tablet Take 5 mg by mouth daily.       . meloxicam (MOBIC) 15 MG tablet Take 15 mg by mouth daily.       . Multiple Vitamin (MULTIVITAMIN) tablet Take 1 tablet by mouth daily.        . Vitamin D, Ergocalciferol, (DRISDOL) 50000 UNITS CAPS Take  1 capsule by mouth every 7 (seven) days.        Allergies as of 07/04/2012  . (No Known Allergies)    ROS:  General: Negative for anorexia, fever, chills, fatigue, weakness. Mild weight loss after her surgery. ENT: Negative for hoarseness, difficulty swallowing , nasal congestion. CV: Negative for chest pain, angina, palpitations, dyspnea on exertion, peripheral edema.  Respiratory: Negative for dyspnea at rest, dyspnea on exertion, cough, sputum, wheezing.  GI: See history of present illness. GU:  Negative for hematuria, urinary incontinence. See history of present illness  Endo: Negative for unusual weight change.    Physical Examination:   BP 131/68  Pulse 75  Temp 97.9 F (36.6 C) (Temporal)  Ht 5\' 8"  (1.727 m)  Wt 274 lb 12.8  oz (124.648 kg)  BMI 41.78 kg/m2  General: Well-nourished, well-developed in no acute distress.  Eyes: No icterus. Mouth: Oropharyngeal mucosa moist and pink , no lesions erythema or exudate. Lungs: Clear to auscultation bilaterally.  Heart: Regular rate and rhythm, no murmurs rubs or gallops.  Abdomen: Bowel sounds are normal, nontender, nondistended, no hepatosplenomegaly or masses, no abdominal bruits or hernia , no rebound or guarding.   Extremities: No lower extremity edema. No clubbing or deformities. Neuro: Alert and oriented x 4   Skin: Warm and dry, no jaundice.   Psych: Alert and cooperative, normal mood and affect.

## 2012-07-05 LAB — URINALYSIS W MICROSCOPIC + REFLEX CULTURE
Bacteria, UA: NONE SEEN
Bilirubin Urine: NEGATIVE
Casts: NONE SEEN
Glucose, UA: NEGATIVE mg/dL
Hgb urine dipstick: NEGATIVE
Ketones, ur: NEGATIVE mg/dL
Leukocytes, UA: NEGATIVE
pH: 5.5 (ref 5.0–8.0)

## 2012-07-05 NOTE — Progress Notes (Signed)
Quick Note:  Please let patient know, her urine specimen shows no signs of infection. If persistent burning, consider seeing PCP. ______

## 2012-08-05 ENCOUNTER — Encounter: Payer: Self-pay | Admitting: Internal Medicine

## 2012-08-16 ENCOUNTER — Encounter: Payer: Self-pay | Admitting: Internal Medicine

## 2012-08-18 ENCOUNTER — Telehealth: Payer: Self-pay | Admitting: *Deleted

## 2012-08-18 NOTE — Telephone Encounter (Signed)
Robin [name] called today. She is looking for some samples of Dexilant. Please call her back. Thanks.

## 2012-08-18 NOTE — Telephone Encounter (Signed)
She can continue BID until her next OV. Does she need RX too?

## 2012-08-18 NOTE — Telephone Encounter (Signed)
Spoke with pt- wanted to make sure she wasn't taking the dexilant bid because she was only to take it bid for 4 weeks. Pt stated she was only taking it qd now but she felt like it worked better bid.   #3 boxes of dexilant left at front desk for pt to pick up.

## 2012-08-24 NOTE — Telephone Encounter (Signed)
Pt stated she was going to take qd for now.

## 2012-08-25 ENCOUNTER — Encounter: Payer: Self-pay | Admitting: Internal Medicine

## 2012-08-29 ENCOUNTER — Ambulatory Visit (INDEPENDENT_AMBULATORY_CARE_PROVIDER_SITE_OTHER): Payer: Managed Care, Other (non HMO) | Admitting: Gastroenterology

## 2012-08-29 ENCOUNTER — Encounter: Payer: Self-pay | Admitting: Gastroenterology

## 2012-08-29 VITALS — BP 136/86 | HR 83 | Temp 98.0°F | Ht 68.0 in | Wt 276.6 lb

## 2012-08-29 DIAGNOSIS — R1013 Epigastric pain: Secondary | ICD-10-CM

## 2012-08-29 DIAGNOSIS — K219 Gastro-esophageal reflux disease without esophagitis: Secondary | ICD-10-CM

## 2012-08-29 MED ORDER — ESOMEPRAZOLE MAGNESIUM 40 MG PO CPDR: 40.0000 mg | DELAYED_RELEASE_CAPSULE | Freq: Two times a day (BID) | ORAL | Status: DC

## 2012-08-29 NOTE — Assessment & Plan Note (Signed)
Was better on Dexilant twice a day but I do not feel this is a long-term option as this is a dual-release medication. No alarm symptoms at this time. She will stop Dexilant. Trial of Nexium 40 mg twice a day. #20 samples provided as well as prescription. Progress report in 10 days. If symptoms do not settle down, she may need EGD. Keep upcoming appointment with Dr. Jena Gauss in 10/2012.

## 2012-08-29 NOTE — Progress Notes (Signed)
Faxed to PCP

## 2012-08-29 NOTE — Progress Notes (Signed)
Primary Care Physician: Colette Ribas, MD  Primary Gastroenterologist:  Roetta Sessions, MD   Chief Complaint  Patient presents with  . Abdominal Pain  . Heartburn    HPI: Robin [name] is a 56 y.o. female here for f/u abdominal pain, heartburn. She was last in the office on 07/04/2012. She was complaining of refractory heartburn, epigastric pain. 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 She increased her Dexilant to twice daily. She had been on increased ibuprofen use.  Dexilant BID seemed to help until went back to once daily three weeks ago. Lots of burning in throat/chest/epigastrium. BM 1-2 per day. No melena or rectal bleeding. No longer on ibuprofen. Uses Mobic sparingly as possible but several times per week. Previously failed Prilosec and Zegerid both twice a day. She has been on Dexilant for several years. Weight up 2 pounds since last office visit.    Current Outpatient Prescriptions  Medication Sig Dispense Refill  . aspirin 81 MG tablet Take 81 mg by mouth daily.        Marland Kitchen BENICAR HCT 40-12.5 MG per tablet Take 1 tablet by mouth daily.      Marland Kitchen buPROPion (WELLBUTRIN XL) 300 MG 24 hr tablet Take 300 mg by mouth every morning.       . colesevelam (WELCHOL) 625 MG tablet Take 1,250 mg by mouth 2 (two) times daily with a meal.       . dexlansoprazole (DEXILANT) 60 MG capsule Take 60 mg by mouth 2 (two) times daily before a meal.       . escitalopram (LEXAPRO) 20 MG tablet Take 20 mg by mouth daily.       Marland Kitchen estradiol (ESTRACE) 1 MG tablet Take 1 mg by mouth daily.       . fluticasone (FLONASE) 50 MCG/ACT nasal spray Place 2 sprays into the nose daily as needed. For congestion      . gabapentin (NEURONTIN) 300 MG capsule Take 300 mg by mouth 2 (two) times daily.       Marland Kitchen levocetirizine (XYZAL) 5 MG tablet Take 5 mg by mouth daily.       . meloxicam (MOBIC) 15 MG tablet Take 15 mg by mouth daily.       .  Multiple Vitamin (MULTIVITAMIN) tablet Take 1 tablet by mouth daily.        . Vitamin D, Ergocalciferol, (DRISDOL) 50000 UNITS CAPS Take 1 capsule by mouth every 7 (seven) days.        Allergies as of 08/29/2012  . (No Known Allergies)    ROS:  General: Negative for anorexia, weight loss, fever, chills, fatigue, weakness. ENT: Negative for hoarseness, difficulty swallowing , nasal congestion. CV: Negative for chest pain, angina, palpitations, dyspnea on exertion, peripheral edema.  Respiratory: Negative for dyspnea at rest, dyspnea on exertion, cough, sputum, wheezing.  GI: See history of present illness. GU:  Negative for dysuria, hematuria, urinary incontinence, urinary frequency, nocturnal urination.  Endo: Negative for unusual weight change.    Physical Examination:   BP 136/86  Pulse 83  Temp 98 F (36.7 C) (Temporal)  Ht 5\' 8"  (1.727 m)  Wt 276 lb 9.6 oz (125.465 kg)  BMI 42.06 kg/m2  General: Well-nourished, well-developed in no acute distress.  Eyes: No icterus. Mouth: Oropharyngeal mucosa moist and pink , no lesions erythema or exudate. Lungs: Clear to auscultation bilaterally.  Heart: Regular rate and rhythm, no murmurs rubs  or gallops.  Abdomen: Bowel sounds are normal, nontender, nondistended, no hepatosplenomegaly or masses, no abdominal bruits or hernia , no rebound or guarding.   Extremities: No lower extremity edema. No clubbing or deformities. Neuro: Alert and oriented x 4   Skin: Warm and dry, no jaundice.   Psych: Alert and cooperative, normal mood and affect.

## 2012-08-29 NOTE — Patient Instructions (Addendum)
Stop Dexilant for now. Start Nexium 40mg  one capsule 30 minutes before your breakfast and your evening meal. Samples and prescription sent to you pharmacy. Call in 10 days and let us know how you are doing. If not better, you may need an upper endoscopy. Keep your appointment with Dr. Jena Gauss as scheduled in 10/2012.

## 2012-09-23 ENCOUNTER — Other Ambulatory Visit: Payer: Self-pay | Admitting: Obstetrics and Gynecology

## 2012-09-23 DIAGNOSIS — N644 Mastodynia: Secondary | ICD-10-CM

## 2012-10-04 ENCOUNTER — Ambulatory Visit
Admission: RE | Admit: 2012-10-04 | Discharge: 2012-10-04 | Disposition: A | Discharge status: Home / Self Care | Payer: Medicare HMO | Source: Ambulatory Visit | Attending: Obstetrics and Gynecology | Admitting: Obstetrics and Gynecology

## 2012-10-04 DIAGNOSIS — N644 Mastodynia: Secondary | ICD-10-CM

## 2012-10-07 ENCOUNTER — Ambulatory Visit: Payer: Managed Care, Other (non HMO) | Admitting: Internal Medicine

## 2012-10-14 ENCOUNTER — Encounter: Payer: Self-pay | Admitting: Internal Medicine

## 2012-10-14 ENCOUNTER — Ambulatory Visit (INDEPENDENT_AMBULATORY_CARE_PROVIDER_SITE_OTHER): Payer: Medicare HMO | Admitting: Internal Medicine

## 2012-10-14 VITALS — BP 138/79 | HR 73 | Temp 98.3°F | Ht 68.0 in | Wt 279.6 lb

## 2012-10-14 DIAGNOSIS — K219 Gastro-esophageal reflux disease without esophagitis: Secondary | ICD-10-CM

## 2012-10-14 DIAGNOSIS — R1310 Dysphagia, unspecified: Secondary | ICD-10-CM

## 2012-10-14 NOTE — Patient Instructions (Signed)
Barium pill esophagram - evaluate dysphagia  Stop Nexium; Trial of Protonix 40 mg twice daily  Take Miralax twice weekly to facilitate bowel function  GERD information provided; weight loss encouraged  Office Visit in 6 weeks

## 2012-10-14 NOTE — Progress Notes (Signed)
Primary Care Physician:  Colette Ribas, MD Primary Gastroenterologist:  Dr. Jena Gauss  Pre-Procedure History & Physical: HPI:  Robin [name] is a 57 y.o. female here for followup of GERD. Patient switch from Dexilant and Nexium. Cannot really tell much the way of a recent reflux symptoms she is morbidly obese and has gained another 4 pounds since her last office visit. Feels her esophagus closing up from time to time. This lady has a history of Schatzki's ring which I dilated back in 2012-dilation helped for a little while. Some intermittent abdominal pain the setting of constipation. Colonoscopy in 2012 demonstrated some nonspecific inflammation of the terminal ileum and a benign polyp removed from the descending colon. She feels that her rectocele repair has come undone. She complains of dyspareunia and is being referred to a GYN specialist in [facility] by Dr. Phillips Odor. Gallbladder remains in situ.  Past Medical History  Diagnosis Date  . GERD (gastroesophageal reflux disease)   . Constipation   . Anxiety disorder   . Hyperlipidemia   . Hypertension   . Chronic neck pain   . Schatzki's ring     non critical  . Hiatal hernia     small  . Sleep apnea     CPAP, Sleep study at [facility]  . Arthritis     Past Surgical History  Procedure Date  . Knee arthroscopy   . Cardiac catheterization   . Vaginal prolapse repair   . Rectocele repair   . Esophagogastroduodenoscopy 08/23/2008    [id number] distal esophageal erosions consistent with mild erosive reflux esophagitis, otherwise unremarkable esophagus/ Tiny antral erosions of doubtful clinical significance, otherwise normal stomach, patent pylorus, normal D1 and D2  . Colonoscopy 2006    Dr. Franky Macho, hyperplastic polyps  . Colonoscopy 08/06/11    abnormal terminal ileum for 10cm, erosions, geographical ulceration. Bx small bowel mucosa with prominent intramucosal lymphoid aggregates, slightly inflammed  .  Esophagogastroduodenoscopy 08/06/11    small hh, noncritical Schatzki's ring s/p 58 F  . Abdominal hysterectomy   . Direct laryngoscopy 03/15/2012    Procedure: DIRECT LARYNGOSCOPY;  Surgeon: Flo Shanks, MD;  Location: [facility] OR;  Service: ENT;  Laterality: N/A; Dr. Wolicki--:>no foreign body seen. normal esophagus to 40cm  . Esophagoscopy 03/15/2012    Procedure: ESOPHAGOSCOPY;  Surgeon: Flo Shanks, MD;  Location: [facility] OR;  Service: ENT;  Laterality: N/A;  . Pituitary surgery 06/2012    benign tumor, surgeon at [facility]    Prior to Admission medications   Medication Sig Start Date End Date Taking? Authorizing Provider  aspirin 81 MG tablet Take 81 mg by mouth daily.     Yes Historical Provider, MD  BENICAR HCT 40-12.5 MG per tablet Take 1 tablet by mouth daily. 05/09/11  Yes Historical Provider, MD  buPROPion (WELLBUTRIN XL) 300 MG 24 hr tablet Take 300 mg by mouth every morning.  09/08/11  Yes Historical Provider, MD  colesevelam (WELCHOL) 625 MG tablet Take 1,250 mg by mouth 2 (two) times daily with a meal.    Yes Historical Provider, MD  escitalopram (LEXAPRO) 20 MG tablet Take 20 mg by mouth daily.  08/24/12  Yes Historical Provider, MD  esomeprazole (NEXIUM) 40 MG capsule Take 1 capsule (40 mg total) by mouth 2 (two) times daily before a meal. 08/29/12  Yes Tiffany Kocher, PA  estradiol (ESTRACE) 1 MG tablet Take 1 mg by mouth daily.  09/10/11  Yes Historical Provider, MD  fluticasone (FLONASE) 50 MCG/ACT nasal  spray Place 2 sprays into the nose daily as needed. For congestion 11/11/11 11/10/12 Yes Lamar Laundry, MD  gabapentin (NEURONTIN) 300 MG capsule Take 300 mg by mouth 2 (two) times daily.  02/11/12  Yes Historical Provider, MD  levocetirizine (XYZAL) 5 MG tablet Take 5 mg by mouth daily.    Yes Historical Provider, MD  meloxicam (MOBIC) 15 MG tablet Take 15 mg by mouth daily as needed.  01/25/12  Yes Historical Provider, MD  Multiple Vitamin (MULTIVITAMIN) tablet Take 1 tablet by mouth daily.      Yes Historical Provider, MD  Vitamin D, Ergocalciferol, (DRISDOL) 50000 UNITS CAPS Take 1 capsule by mouth every 7 (seven) days. 01/12/12  Yes Historical Provider, MD    Allergies as of 10/14/2012  . (No Known Allergies)    Family History  Problem Relation Age of Onset  . Kidney cancer Father   . Stomach cancer      aunt  . Breast cancer      grandmother  . Colon cancer Neg Hx     History   Social History  . Marital Status: Married    Spouse Name: N/A    Number of Children: 4  . Years of Education: N/A   Occupational History  .     Social History Main Topics  . Smoking status: Former Smoker -- 0.5 packs/day    Types: Cigarettes  . Smokeless tobacco: Not on file     Comment: 17 yrs ago  . Alcohol Use: No  . Drug Use: No  . Sexually Active: Not on file   Other Topics Concern  . Not on file   Social History Narrative  . No narrative on file    Review of Systems: See HPI, otherwise negative ROS  Physical Exam: BP 138/79  Pulse 73  Temp 98.3 F (36.8 C) (Oral)  Ht 5\' 8"  (1.727 m)  Wt 279 lb 9.6 oz (126.826 kg)  BMI 42.51 kg/m2 General:   Morbidly obese appearing   Well-developed, well-nourished, pleasant and cooperative in NAD Skin:  Intact without significant lesions or rashes. Eyes:  Sclera clear, no icterus.   Conjunctiva pink. Ears:  Normal auditory acuity. Nose:  No deformity, discharge,  or lesions. Mouth:  No deformity or lesions. Neck:  Supple; no masses or thyromegaly. No significant cervical adenopathy. Lungs:  Clear throughout to auscultation.   No wheezes, crackles, or rhonchi. No acute distress. Heart:  Regular rate and rhythm; no murmurs, clicks, rubs,  or gallops. Abdomen: Obese. Non-distended, normal bowel sounds.  Soft and nontender without appreciable mass or hepatosplenomegaly.  Pulses:  Normal pulses noted. Extremities:  Without clubbing or edema.  Impression/Plan:   Symptoms consistent with refractory GERD; No esophagitis on prior  EGD. Schatzki's ring dilated previously now with some symptoms consistent with recurrent dysphagia. Also has recurrent GYN symptoms which are going to be addressed elsewhere. Some intermittent constipation no laxative. She does take a stool softener. Otherwise, EGD /colonoscopy findings about one and a half years ago remained reassuring. I discussed the multipronged approach regarding GERD with the patient. I pointed out to her how significant and progressive weight gain would undermine our ability to treat her reflux symptoms. Recommendations: Weight loss and exercise recommended. Stop Nexium; trial of  Protonixper tonics 40 mg orally twice daily for the next month-prescription provided area to pursue a barium pill esophagram to evaluate her dysphagia symptoms further.  MiraLax 17 g orally twice weekly to facilitate bowel function. Office visit with [name]  in 6 weeks

## 2012-10-20 ENCOUNTER — Ambulatory Visit (HOSPITAL_COMMUNITY)
Admission: RE | Admit: 2012-10-20 | Discharge: 2012-10-20 | Disposition: A | Discharge status: Home / Self Care | Payer: Medicare HMO | Source: Ambulatory Visit | Attending: Internal Medicine | Admitting: Internal Medicine

## 2012-10-20 DIAGNOSIS — R1319 Other dysphagia: Secondary | ICD-10-CM | POA: Insufficient documentation

## 2012-10-20 DIAGNOSIS — K219 Gastro-esophageal reflux disease without esophagitis: Secondary | ICD-10-CM | POA: Insufficient documentation

## 2012-10-20 DIAGNOSIS — R1310 Dysphagia, unspecified: Secondary | ICD-10-CM

## 2012-11-25 ENCOUNTER — Ambulatory Visit (HOSPITAL_COMMUNITY)
Admission: RE | Admit: 2012-11-25 | Discharge: 2012-11-25 | Disposition: A | Discharge status: Home / Self Care | Payer: Managed Care, Other (non HMO) | Source: Ambulatory Visit | Attending: Family Medicine | Admitting: Family Medicine

## 2012-11-25 ENCOUNTER — Ambulatory Visit: Payer: Medicare HMO | Admitting: Internal Medicine

## 2012-11-25 ENCOUNTER — Other Ambulatory Visit (HOSPITAL_COMMUNITY): Payer: Self-pay | Admitting: Family Medicine

## 2012-11-25 DIAGNOSIS — M5030 Other cervical disc degeneration, unspecified cervical region: Secondary | ICD-10-CM

## 2012-11-25 DIAGNOSIS — M47812 Spondylosis without myelopathy or radiculopathy, cervical region: Secondary | ICD-10-CM | POA: Insufficient documentation

## 2012-11-25 DIAGNOSIS — M542 Cervicalgia: Secondary | ICD-10-CM

## 2012-11-25 DIAGNOSIS — M25519 Pain in unspecified shoulder: Secondary | ICD-10-CM | POA: Insufficient documentation

## 2012-12-06 ENCOUNTER — Other Ambulatory Visit: Payer: Self-pay | Admitting: Internal Medicine

## 2012-12-28 ENCOUNTER — Encounter: Payer: Self-pay | Admitting: Internal Medicine

## 2012-12-28 ENCOUNTER — Ambulatory Visit (INDEPENDENT_AMBULATORY_CARE_PROVIDER_SITE_OTHER): Payer: Managed Care, Other (non HMO) | Admitting: Internal Medicine

## 2012-12-28 VITALS — BP 128/78 | HR 61 | Temp 98.2°F | Ht 70.0 in | Wt 280.0 lb

## 2012-12-28 DIAGNOSIS — K5900 Constipation, unspecified: Secondary | ICD-10-CM

## 2012-12-28 DIAGNOSIS — R1319 Other dysphagia: Secondary | ICD-10-CM

## 2012-12-28 NOTE — Progress Notes (Signed)
Pt is aware of OV and appt card was mailed °

## 2012-12-28 NOTE — Patient Instructions (Addendum)
Continue Miralax daily as needed  Continue Protonix  Schedule manometry at [facility] - hx dysphagia and dilated esophagus on BPE  weight loss recommended / GERD informations provided  OV with [name] in 2 months  Referral has been faxed to [facility]

## 2012-12-28 NOTE — Progress Notes (Signed)
Primary Care Physician:  Colette Ribas, MD Primary Gastroenterologist:  Dr. Jena Gauss  Pre-Procedure History & Physical: HPI:  Robin [name] is a 57 y.o. female here for GERD and dysphagia. Switched her to Protonix 40 mg twice a day last office visit. States it helps better than Nexium but still has some issues with reflux about 50% of the time. Still has sensation that food moves very slowly down into her stomach. Barium pill esophagram demonstrated a dilated distal esophagus. No impediment to passage of the barium pill. She remains morbidly obese has not lost any weight. 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 She tells me Dr. Phillips Odor switched her from meloxicam to Celebrex given inflammatory changes seen in terminal ileum previously. She will be due for screening colonoscopy in 8 years.  Patient also complains of a non-GI issue, that of pain shooting down her posterior left thigh from time to time. Has not seen Dr. Phillips Odor as of yet for that problem.  Past Medical History  Diagnosis Date  . GERD (gastroesophageal reflux disease)   . Constipation   . Anxiety disorder   . Hyperlipidemia   . Hypertension   . Chronic neck pain   . Schatzki's ring     non critical  . Hiatal hernia     small  . Sleep apnea     CPAP, Sleep study at [facility]  . Arthritis     Past Surgical History  Procedure Laterality Date  . Knee arthroscopy    . Cardiac catheterization    . Vaginal prolapse repair    . Rectocele repair    . Esophagogastroduodenoscopy  08/23/2008    ZOX:WRUE distal esophageal erosions consistent with mild erosive reflux esophagitis, otherwise unremarkable esophagus/ Tiny antral erosions of doubtful clinical significance, otherwise normal stomach, patent pylorus, normal D1 and D2  . Colonoscopy  2006    Dr. Franky Macho, hyperplastic polyps  . Colonoscopy  08/06/11    abnormal terminal ileum for 10cm, erosions, geographical ulceration. Bx small bowel  mucosa with prominent intramucosal lymphoid aggregates, slightly inflammed  . Esophagogastroduodenoscopy  08/06/11    small hh, noncritical Schatzki's ring s/p 58 F  . Abdominal hysterectomy    . Direct laryngoscopy  03/15/2012    Procedure: DIRECT LARYNGOSCOPY;  Surgeon: Flo Shanks, MD;  Location: [facility] OR;  Service: ENT;  Laterality: N/A; Dr. Wolicki--:>no foreign body seen. normal esophagus to 40cm  . Esophagoscopy  03/15/2012    Procedure: ESOPHAGOSCOPY;  Surgeon: Flo Shanks, MD;  Location: [facility] OR;  Service: ENT;  Laterality: N/A;  . Pituitary surgery  06/2012    benign tumor, surgeon at [facility]    Prior to Admission medications   Medication Sig Start Date End Date Taking? Authorizing Provider  aspirin 81 MG tablet Take 81 mg by mouth daily.     Yes Historical Provider, MD  BENICAR HCT 40-12.5 MG per tablet Take 1 tablet by mouth daily. 05/09/11  Yes Historical Provider, MD  buPROPion (WELLBUTRIN XL) 300 MG 24 hr tablet Take 300 mg by mouth every morning.  09/08/11  Yes Historical Provider, MD  CELEBREX 200 MG capsule Take 200 mg by mouth daily.  11/25/12  Yes Historical Provider, MD  colesevelam (WELCHOL) 625 MG tablet Take 1,250 mg by mouth 2 (two) times daily with a meal.    Yes Historical Provider, MD  escitalopram (LEXAPRO) 20 MG tablet Take 20 mg by mouth daily.  08/24/12  Yes Historical Provider, MD  esomeprazole (NEXIUM)  40 MG capsule Take 1 capsule (40 mg total) by mouth 2 (two) times daily before a meal. 08/29/12  Yes Tiffany Kocher, PA-C  estradiol (ESTRACE) 1 MG tablet Take 1 mg by mouth daily.  09/10/11  Yes Historical Provider, MD  gabapentin (NEURONTIN) 300 MG capsule Take 300 mg by mouth 2 (two) times daily.  02/11/12  Yes Historical Provider, MD  levocetirizine (XYZAL) 5 MG tablet Take 5 mg by mouth daily.    Yes Historical Provider, MD  Multiple Vitamin (MULTIVITAMIN) tablet Take 1 tablet by mouth daily.     Yes Historical Provider, MD  pantoprazole (PROTONIX) 40 MG tablet TAKE  1 TABLET BY MOUTH TWICE A DAY 12/06/12  Yes Joselyn Arrow, NP  Vitamin D, Ergocalciferol, (DRISDOL) 50000 UNITS CAPS Take 1 capsule by mouth every 7 (seven) days. 01/12/12  Yes Historical Provider, MD  fluticasone (FLONASE) 50 MCG/ACT nasal spray Place 2 sprays into the nose daily as needed. For congestion 11/11/11 11/10/12  Lamar Laundry, MD  meloxicam (MOBIC) 15 MG tablet Take 15 mg by mouth daily as needed.  01/25/12   Historical Provider, MD    Allergies as of 12/28/2012  . (No Known Allergies)    Family History  Problem Relation Age of Onset  . Kidney cancer Father   . Stomach cancer      aunt  . Breast cancer      grandmother  . Colon cancer Neg Hx     History   Social History  . Marital Status: Married    Spouse Name: N/A    Number of Children: 4  . Years of Education: N/A   Occupational History  .     Social History Main Topics  . Smoking status: Former Smoker -- 0.50 packs/day    Types: Cigarettes  . Smokeless tobacco: Not on file     Comment: 17 yrs ago  . Alcohol Use: No  . Drug Use: No  . Sexually Active: Not on file   Other Topics Concern  . Not on file   Social History Narrative  . No narrative on file    Review of Systems: See HPI, otherwise negative ROS  Physical Exam: BP 128/78  Pulse 61  Temp(Src) 98.2 F (36.8 C) (Oral)  Ht 5\' 10"  (1.778 m)  Wt 280 lb (127.007 kg)  BMI 40.18 kg/m2 General:   Morbidly obese. Pleasant and comfortable appearing/cooperative in NAD Skin:  Intact without significant lesions or rashes. Eyes:  Sclera clear, no icterus.   Conjunctiva pink. Ears:  Normal auditory acuity. Nose:  No deformity, discharge,  or lesions. Mouth:  No deformity or lesions. Neck:  Supple; no masses or thyromegaly. No significant cervical adenopathy. Lungs:  Clear throughout to auscultation.   No wheezes, crackles, or rhonchi. No acute distress. Heart:  Regular rate and rhythm; no murmurs, clicks, rubs,  or gallops. Abdomen: Obese. Positive  bowel sounds soft nontender without appreciable mass or organomegaly.  Pulses:  Normal pulses noted. Extremities:  Without clubbing or edema.  Impression/Plan:  57 year old lady with GERD symptoms and intermittent esophageal dysphagia. Dilated esophagus (more distally) on recent barium esophagram. No obstruction. This lady may have an underlying esophageal motility disorder in evolution. What she perceives as reflux symptoms not significantly improved with twice a day proton pump inhibitor therapy.  Constipation well managed with MiraLax.  Posterior left thigh/hip pain-non-GI issue.   Recommendations: We'll proceed with an esophageal manometry to further evaluate her dysphagia/GERD issues. Continue Protonix and MiraLax  for now. Followup with Dr. Phillips Odor regarding a left hip/thigh pain  Weight loss emphasized. Office visit with [name] in 8 weeks

## 2013-01-02 ENCOUNTER — Other Ambulatory Visit: Payer: Self-pay

## 2013-01-10 ENCOUNTER — Telehealth: Payer: Self-pay | Admitting: Internal Medicine

## 2013-01-10 NOTE — Telephone Encounter (Signed)
Patient is scheduled for her Esophageal Manometry on 04/18.

## 2013-01-20 ENCOUNTER — Ambulatory Visit (INDEPENDENT_AMBULATORY_CARE_PROVIDER_SITE_OTHER): Payer: Managed Care, Other (non HMO) | Admitting: Family Medicine

## 2013-01-20 VITALS — BP 140/86 | HR 79 | Temp 98.8°F | Resp 18 | Ht 67.0 in | Wt 281.0 lb

## 2013-01-20 DIAGNOSIS — J309 Allergic rhinitis, unspecified: Secondary | ICD-10-CM

## 2013-01-20 DIAGNOSIS — J0100 Acute maxillary sinusitis, unspecified: Secondary | ICD-10-CM

## 2013-01-20 MED ORDER — IPRATROPIUM BROMIDE 0.03 % NA SOLN: 2.0000 | Freq: Two times a day (BID) | NASAL | Status: DC

## 2013-01-20 MED ORDER — BENZONATATE 100 MG PO CAPS: 100.0000 mg | ORAL_CAPSULE | Freq: Three times a day (TID) | ORAL | Status: DC | PRN

## 2013-01-20 MED ORDER — AMOXICILLIN-POT CLAVULANATE 875-125 MG PO TABS: 1.0000 | ORAL_TABLET | Freq: Two times a day (BID) | ORAL | Status: DC

## 2013-01-20 MED ORDER — FLUCONAZOLE 150 MG PO TABS: 150.0000 mg | ORAL_TABLET | Freq: Once | ORAL | Status: DC

## 2013-01-20 NOTE — Progress Notes (Signed)
[address]   [place], [place]  [address]   [phone]  Subjective:    Patient ID: Robin [name], female    DOB: [birth year], 57 y.o.   MRN: [id number]  HPI This 57 y.o. female presents for evaluation of headache, ear pain, sore throat, eyes hurting, sinus pressure. Felt like secondary to pollen.  +sneezing; +coughing.  S/p pituitary gland resection; went through nose for resection; now suffers with chronic nasal congestion thick yellow green.  Onset two weeks ago.  +fever Tmax 100.1.  +chills/sweats.  +HA.  R ear pain.  +PND.  No SOB.  Taking Xyzal, Flonase.  No other medication for symptoms.  No tobacco; unemployed; provides care for mother.  PCP:  Phillips Odor.   Review of Systems  Constitutional: Positive for fever, chills and fatigue. Negative for diaphoresis.  HENT: Positive for ear pain, congestion, sore throat, rhinorrhea, sneezing, trouble swallowing, voice change, postnasal drip and sinus pressure. Negative for drooling and dental problem.   Respiratory: Positive for cough. Negative for shortness of breath, wheezing and stridor.   Gastrointestinal: Negative for nausea, vomiting and diarrhea.  Skin: Negative for rash.  Neurological: Positive for headaches. Negative for dizziness and light-headedness.        Past Medical History  Diagnosis Date  . GERD (gastroesophageal reflux disease)   . Constipation   . Anxiety disorder   . Hyperlipidemia   . Hypertension   . Chronic neck pain   . Schatzki's ring     non critical  . Hiatal hernia     small  . Sleep apnea     CPAP, Sleep study at [facility]  . Arthritis     Past Surgical History  Procedure Laterality Date  . Knee arthroscopy    . Cardiac catheterization    . Vaginal prolapse repair    . Rectocele repair    . Esophagogastroduodenoscopy  08/23/2008    XLK:GMWN distal esophageal erosions consistent with mild erosive reflux esophagitis, otherwise unremarkable esophagus/ Tiny antral erosions of doubtful  clinical significance, otherwise normal stomach, patent pylorus, normal D1 and D2  . Colonoscopy  2006    Dr. Franky Macho, hyperplastic polyps  . Colonoscopy  08/06/11    abnormal terminal ileum for 10cm, erosions, geographical ulceration. Bx small bowel mucosa with prominent intramucosal lymphoid aggregates, slightly inflammed  . Esophagogastroduodenoscopy  08/06/11    small hh, noncritical Schatzki's ring s/p 58 F  . Abdominal hysterectomy    . Direct laryngoscopy  03/15/2012    Procedure: DIRECT LARYNGOSCOPY;  Surgeon: Flo Shanks, MD;  Location: [facility] OR;  Service: ENT;  Laterality: N/A; Dr. Wolicki--:>no foreign body seen. normal esophagus to 40cm  . Esophagoscopy  03/15/2012    Procedure: ESOPHAGOSCOPY;  Surgeon: Flo Shanks, MD;  Location: [facility] OR;  Service: ENT;  Laterality: N/A;  . Pituitary surgery  06/2012    benign tumor, surgeon at [facility]    Prior to Admission medications   Medication Sig Start Date End Date Taking? Authorizing Provider  aspirin 81 MG tablet Take 81 mg by mouth daily.     Yes Historical Provider, MD  BENICAR HCT 40-12.5 MG per tablet Take 1 tablet by mouth daily. 05/09/11  Yes Historical Provider, MD  buPROPion (WELLBUTRIN XL) 300 MG 24 hr tablet Take 300 mg by mouth every morning.  09/08/11  Yes Historical Provider, MD  CELEBREX 200 MG capsule Take 200 mg by mouth daily.  11/25/12  Yes Historical Provider, MD  colesevelam (WELCHOL) 625 MG  tablet Take 1,250 mg by mouth 2 (two) times daily with a meal.    Yes Historical Provider, MD  estradiol (ESTRACE) 1 MG tablet Take 1 mg by mouth daily.  09/10/11  Yes Historical Provider, MD  gabapentin (NEURONTIN) 300 MG capsule Take 300 mg by mouth 2 (two) times daily.  02/11/12  Yes Historical Provider, MD  levocetirizine (XYZAL) 5 MG tablet Take 5 mg by mouth daily.    Yes Historical Provider, MD  meloxicam (MOBIC) 15 MG tablet Take 15 mg by mouth daily as needed.  01/25/12  Yes Historical Provider, MD  Multiple Vitamin  (MULTIVITAMIN) tablet Take 1 tablet by mouth daily.     Yes Historical Provider, MD  pantoprazole (PROTONIX) 40 MG tablet TAKE 1 TABLET BY MOUTH TWICE A DAY 12/06/12  Yes Joselyn Arrow, NP  Vitamin D, Ergocalciferol, (DRISDOL) 50000 UNITS CAPS Take 1 capsule by mouth every 7 (seven) days. 01/12/12  Yes Historical Provider, MD  amoxicillin-clavulanate (AUGMENTIN) 875-125 MG per tablet Take 1 tablet by mouth 2 (two) times daily. 01/20/13   Ethelda Chick, MD  benzonatate (TESSALON) 100 MG capsule Take 1-2 capsules (100-200 mg total) by mouth 3 (three) times daily as needed for cough. 01/20/13   Ethelda Chick, MD  escitalopram (LEXAPRO) 20 MG tablet Take 20 mg by mouth daily.  08/24/12   Historical Provider, MD  esomeprazole (NEXIUM) 40 MG capsule Take 1 capsule (40 mg total) by mouth 2 (two) times daily before a meal. 08/29/12   Tiffany Kocher, PA-C  fluconazole (DIFLUCAN) 150 MG tablet Take 1 tablet (150 mg total) by mouth once. Repeat if needed 01/20/13   Ethelda Chick, MD  fluticasone [facility]) 50 MCG/ACT nasal spray Place 2 sprays into the nose daily as needed. For congestion 11/11/11 11/10/12  Lamar Laundry, MD  ipratropium (ATROVENT) 0.03 % nasal spray Place 2 sprays into the nose 2 (two) times daily. 01/20/13   Ethelda Chick, MD    No Known Allergies  History   Social History  . Marital Status: Married    Spouse Name: N/A    Number of Children: 4  . Years of Education: N/A   Occupational History  .     Social History Main Topics  . Smoking status: Former Smoker -- 0.50 packs/day    Types: Cigarettes  . Smokeless tobacco: Not on file     Comment: 17 yrs ago  . Alcohol Use: No  . Drug Use: No  . Sexually Active: Not on file   Other Topics Concern  . Not on file   Social History Narrative  . No narrative on file    Family History  Problem Relation Age of Onset  . Kidney cancer Father   . Stomach cancer      aunt  . Breast cancer      grandmother  . Colon cancer Neg Hx      Objective:   Physical Exam  Nursing note and vitals reviewed. Constitutional: She is oriented to person, place, and time. She appears well-developed and well-nourished. No distress.  HENT:  Head: Normocephalic and atraumatic.  Right Ear: External ear normal.  Left Ear: External ear normal.  Nose: Right sinus exhibits maxillary sinus tenderness and frontal sinus tenderness. Left sinus exhibits maxillary sinus tenderness and frontal sinus tenderness.  Mouth/Throat: Oropharynx is clear and moist.  Eyes: Conjunctivae are normal. Pupils are equal, round, and reactive to light.  Neck: Normal range of motion. Neck supple.  Cardiovascular: Normal rate, regular rhythm and normal heart sounds.   Pulmonary/Chest: Effort normal and breath sounds normal. No respiratory distress. She has no wheezes. She has no rales.  Lymphadenopathy:    She has no cervical adenopathy.  Neurological: She is alert and oriented to person, place, and time.  Skin: She is not diaphoretic.  Psychiatric: She has a normal mood and affect. Her behavior is normal.       Assessment & Plan:  Sinusitis, acute maxillary - Plan: amoxicillin-clavulanate (AUGMENTIN) 875-125 MG per tablet, ipratropium (ATROVENT) 0.03 % nasal spray, benzonatate (TESSALON) 100 MG capsule  Allergic rhinitis     1. Acute Sinusitis Maxillary:  New.  Rx for Augmentin, Atrovent nasal spray provided; also rx for Tessalon Perles for nighttime cough.   2.  Allergic Rhinitis: worsening; rx for Atrovent nasal spray; continue Xyzal, Flonase daily.   Meds ordered this encounter  Medications  . amoxicillin-clavulanate (AUGMENTIN) 875-125 MG per tablet    Sig: Take 1 tablet by mouth 2 (two) times daily.    Dispense:  20 tablet    Refill:  0  . ipratropium (ATROVENT) 0.03 % nasal spray    Sig: Place 2 sprays into the nose 2 (two) times daily.    Dispense:  30 mL    Refill:  5  . benzonatate (TESSALON) 100 MG capsule    Sig: Take 1-2 capsules  (100-200 mg total) by mouth 3 (three) times daily as needed for cough.    Dispense:  40 capsule    Refill:  0  . fluconazole (DIFLUCAN) 150 MG tablet    Sig: Take 1 tablet (150 mg total) by mouth once. Repeat if needed    Dispense:  2 tablet    Refill:  0

## 2013-01-20 NOTE — Patient Instructions (Addendum)

## 2013-01-31 ENCOUNTER — Ambulatory Visit: Payer: Managed Care, Other (non HMO) | Admitting: Internal Medicine

## 2013-02-04 ENCOUNTER — Telehealth: Payer: Self-pay

## 2013-02-04 MED ORDER — FLUCONAZOLE 150 MG PO TABS: 150.0000 mg | ORAL_TABLET | Freq: Once | ORAL | Status: DC

## 2013-02-04 NOTE — Telephone Encounter (Signed)
Patient was seen on April 18 by Dr. Katrinka Blazing. Was given an ABX and she has a yeast infection. She was given 2 pills for the yeast already but the infection is still there. Uses [facility]. CB # is [id number].

## 2013-02-04 NOTE — Telephone Encounter (Signed)
Sent 1 additional dose of fluconazole to pharmacy, if not improved in 7 days pt needs to RTC for further eval

## 2013-02-04 NOTE — Telephone Encounter (Signed)
Can we send in a prescription to extend her treatment- she is still having symptoms of itching, burning in her vaginal area. She did take the Diflucan but it has not resolved. She uses [facility]

## 2013-02-04 NOTE — Telephone Encounter (Signed)
Can we send in a prescription to extend her treatment-

## 2013-02-04 NOTE — Addendum Note (Signed)
Addended by: Godfrey Pick on: 02/04/2013 04:37 PM   Modules accepted: Orders

## 2013-02-05 NOTE — Telephone Encounter (Signed)
Called pt advised Rx sent in. Pt understood

## 2013-02-10 ENCOUNTER — Ambulatory Visit: Payer: Managed Care, Other (non HMO) | Admitting: Internal Medicine

## 2013-03-21 ENCOUNTER — Other Ambulatory Visit: Payer: Self-pay | Admitting: Urgent Care

## 2013-03-24 ENCOUNTER — Other Ambulatory Visit: Payer: Self-pay

## 2013-03-24 ENCOUNTER — Encounter: Payer: Self-pay | Admitting: Internal Medicine

## 2013-03-24 ENCOUNTER — Ambulatory Visit (INDEPENDENT_AMBULATORY_CARE_PROVIDER_SITE_OTHER): Payer: Managed Care, Other (non HMO) | Admitting: Internal Medicine

## 2013-03-24 VITALS — BP 129/70 | HR 61 | Temp 98.3°F | Ht 70.0 in | Wt 273.6 lb

## 2013-03-24 DIAGNOSIS — R1310 Dysphagia, unspecified: Secondary | ICD-10-CM

## 2013-03-24 DIAGNOSIS — K5900 Constipation, unspecified: Secondary | ICD-10-CM

## 2013-03-24 DIAGNOSIS — K219 Gastro-esophageal reflux disease without esophagitis: Secondary | ICD-10-CM

## 2013-03-24 MED ORDER — PANTOPRAZOLE SODIUM 40 MG PO TBEC: DELAYED_RELEASE_TABLET | ORAL | Status: DC

## 2013-03-24 NOTE — Progress Notes (Signed)
Primary Care Physician:  Colette Ribas, MD Primary Gastroenterologist:  Dr. Jena Gauss  Pre-Procedure History & Physical: HPI:  Robin [name] is a 57 y.o. female here for followup of GERD. Dysphagia. Dilated esophagus on prior barium study but no impediment to transit of the pill. Esophageal manometry negative for achalasia, however, intraesophageal pressures were high consistent with a diagnosis of "jackhammer" esophagus:  Patient states all she takes a large amount of fluid with pills or eats she takes her pills dysphagia is not so much a problem. She continues to require twice a day for tonics reticulocyte control of her reflux symptoms. She is trying to eat more healthy recently. She continues to be somewhat constipated taking MiraLax once daily. A fiber supplement and a probiotic. She experienced no rectal bleeding. She's had some low back pelvic pain. She has a history of some gynecological issues for which she's seeing a gynecologist in the near future.  Past Medical History  Diagnosis Date  . GERD (gastroesophageal reflux disease)   . Constipation   . Anxiety disorder   . Hyperlipidemia   . Hypertension   . Chronic neck pain   . Schatzki's ring     non critical  . Hiatal hernia     small  . Sleep apnea     CPAP, Sleep study at [facility]  . Arthritis     Past Surgical History  Procedure Laterality Date  . Knee arthroscopy    . Cardiac catheterization    . Vaginal prolapse repair    . Rectocele repair    . Esophagogastroduodenoscopy  08/23/2008    ZOX:WRUE distal esophageal erosions consistent with mild erosive reflux esophagitis, otherwise unremarkable esophagus/ Tiny antral erosions of doubtful clinical significance, otherwise normal stomach, patent pylorus, normal D1 and D2  . Colonoscopy  2006    Dr. Franky Macho, hyperplastic polyps  . Colonoscopy  08/06/11    abnormal terminal ileum for 10cm, erosions, geographical ulceration. Bx small bowel mucosa with  prominent intramucosal lymphoid aggregates, slightly inflammed  . Esophagogastroduodenoscopy  08/06/11    small hh, noncritical Schatzki's ring s/p 58 F  . Abdominal hysterectomy    . Direct laryngoscopy  03/15/2012    Procedure: DIRECT LARYNGOSCOPY;  Surgeon: Flo Shanks, MD;  Location: [facility] OR;  Service: ENT;  Laterality: N/A; Dr. Wolicki--:>no foreign body seen. normal esophagus to 40cm  . Esophagoscopy  03/15/2012    Procedure: ESOPHAGOSCOPY;  Surgeon: Flo Shanks, MD;  Location: [facility] OR;  Service: ENT;  Laterality: N/A;  . Pituitary surgery  06/2012    benign tumor, surgeon at [facility]    Prior to Admission medications   Medication Sig Start Date End Date Taking? Authorizing Provider  amoxicillin-clavulanate (AUGMENTIN) 875-125 MG per tablet Take 1 tablet by mouth 2 (two) times daily. 01/20/13  Yes Ethelda Chick, MD  aspirin 81 MG tablet Take 81 mg by mouth daily.     Yes Historical Provider, MD  BENICAR HCT 40-12.5 MG per tablet Take 1 tablet by mouth daily. 05/09/11  Yes Historical Provider, MD  benzonatate (TESSALON) 100 MG capsule Take 1-2 capsules (100-200 mg total) by mouth 3 (three) times daily as needed for cough. 01/20/13  Yes Ethelda Chick, MD  buPROPion (WELLBUTRIN XL) 300 MG 24 hr tablet Take 300 mg by mouth every morning.  09/08/11  Yes Historical Provider, MD  CELEBREX 200 MG capsule Take 200 mg by mouth daily.  11/25/12  Yes Historical Provider, MD  colesevelam (WELCHOL) 625 MG  tablet Take 1,250 mg by mouth 2 (two) times daily with a meal.    Yes Historical Provider, MD  escitalopram (LEXAPRO) 20 MG tablet Take 20 mg by mouth daily.  08/24/12  Yes Historical Provider, MD  esomeprazole (NEXIUM) 40 MG capsule Take 1 capsule (40 mg total) by mouth 2 (two) times daily before a meal. 08/29/12  Yes Tiffany Kocher, PA-C  estradiol (ESTRACE) 1 MG tablet Take 1 mg by mouth daily.  09/10/11  Yes Historical Provider, MD  fluconazole (DIFLUCAN) 150 MG tablet Take 1 tablet (150 mg total) by  mouth once. Repeat if needed 02/04/13  Yes Eleanore E Egan, PA-C  gabapentin (NEURONTIN) 300 MG capsule Take 300 mg by mouth 2 (two) times daily.  02/11/12  Yes Historical Provider, MD  ipratropium (ATROVENT) 0.03 % nasal spray Place 2 sprays into the nose 2 (two) times daily. 01/20/13  Yes Ethelda Chick, MD  levocetirizine (XYZAL) 5 MG tablet Take 5 mg by mouth daily.    Yes Historical Provider, MD  meloxicam (MOBIC) 15 MG tablet Take 15 mg by mouth daily as needed.  01/25/12  Yes Historical Provider, MD  Multiple Vitamin (MULTIVITAMIN) tablet Take 1 tablet by mouth daily.     Yes Historical Provider, MD  pantoprazole (PROTONIX) 40 MG tablet TAKE 1 TABLET BY MOUTH TWICE A DAY 03/21/13  Yes Tiffany Kocher, PA-C  Vitamin D, Ergocalciferol, (DRISDOL) 50000 UNITS CAPS Take 1 capsule by mouth every 7 (seven) days. 01/12/12  Yes Historical Provider, MD  fluticasone (FLONASE) 50 MCG/ACT nasal spray Place 2 sprays into the nose daily as needed. For congestion 11/11/11 11/10/12  Lamar Laundry, MD    Allergies as of 03/24/2013  . (No Known Allergies)    Family History  Problem Relation Age of Onset  . Kidney cancer Father   . Stomach cancer      aunt  . Breast cancer      grandmother  . Colon cancer Neg Hx     History   Social History  . Marital Status: Married    Spouse Name: N/A    Number of Children: 4  . Years of Education: N/A   Occupational History  .     Social History Main Topics  . Smoking status: Former Smoker -- 0.50 packs/day    Types: Cigarettes  . Smokeless tobacco: Not on file     Comment: 17 yrs ago  . Alcohol Use: No  . Drug Use: No  . Sexually Active: Not on file   Other Topics Concern  . Not on file   Social History Narrative  . No narrative on file    Review of Systems: See HPI, otherwise negative ROS  Physical Exam: BP 129/70  Pulse 61  Temp(Src) 98.3 F (36.8 C) (Oral)  Ht 5\' 10"  (1.778 m)  Wt 273 lb 9.6 oz (124.104 kg)  BMI 39.26 kg/m2 General:    Alert,  Well-developed, morbidly obese pleasant and cooperative in NAD Skin:  Intact without significant lesions or rashes. Eyes:  Sclera clear, no icterus.   Conjunctiva pink. Ears:  Normal auditory acuity. Nose:  No deformity, discharge,  or lesions. Mouth:  No deformity or lesions. Neck:  Supple; no masses or thyromegaly. No significant cervical adenopathy. Lungs:  Clear throughout to auscultation.   No wheezes, crackles, or rhonchi. No acute distress. Heart:  Regular rate and rhythm; no murmurs, clicks, rubs,  or gallops. Abdomen: Morbidly obese. Positive bowel sounds. Soft and nontender appreciable  mass or obvious organomegaly. Pulses:  Normal pulses noted. Extremities:  Without clubbing or edema.  Impression/Plan:  Morbidly obese 57 year old lady with GERD and nonspecific motility disorder the esophagus. Achalasia ruled out with recent manometry. Dysphagia appears not to be so much problem he states. Still requiring twice a day proton pump inhibitor therapy to manage reflux in the setting of morbid obesity. Chronic constipation continues to be itchy. She may well benefit from a probiotic condition of fiber supplementation to her regimen daily.    Recommendations: Take Benefiber - 2 tablespoons daily  Add Align - one capsule daily  Continue Miralax daily as needed  Continue Protonix  Swallowing precautions reviewed  See Gynecologist per plan

## 2013-03-24 NOTE — Patient Instructions (Addendum)
Take Benefiber - 2 tablespoons daily  Add Align - one capsule daily  Continue Miralax daily as needed  Continue Protonix  Swallowing precautions reviewed  See Gynecologist per plan

## 2013-03-28 NOTE — Progress Notes (Signed)
Cc PCP 

## 2013-04-11 ENCOUNTER — Encounter: Payer: Self-pay | Admitting: Internal Medicine

## 2013-05-18 ENCOUNTER — Encounter: Payer: Self-pay | Admitting: Internal Medicine

## 2013-07-11 ENCOUNTER — Encounter: Payer: Self-pay | Admitting: Internal Medicine

## 2013-07-11 ENCOUNTER — Ambulatory Visit (INDEPENDENT_AMBULATORY_CARE_PROVIDER_SITE_OTHER): Payer: Managed Care, Other (non HMO) | Admitting: Internal Medicine

## 2013-07-11 VITALS — BP 131/75 | HR 62 | Temp 97.6°F | Ht 68.0 in | Wt 275.6 lb

## 2013-07-11 DIAGNOSIS — K219 Gastro-esophageal reflux disease without esophagitis: Secondary | ICD-10-CM

## 2013-07-11 DIAGNOSIS — R1314 Dysphagia, pharyngoesophageal phase: Secondary | ICD-10-CM

## 2013-07-11 DIAGNOSIS — K5900 Constipation, unspecified: Secondary | ICD-10-CM

## 2013-07-11 NOTE — Patient Instructions (Addendum)
Take Linzess 145 once daily  Take Benefiber 1 tablespoon twice daily  Telephone progress report in 2 weeks  GERD information  Weight loss encouraged

## 2013-07-11 NOTE — Progress Notes (Signed)
Primary Care Physician:  Colette Ribas, MD Primary Gastroenterologist:  Dr. Jena Gauss  Pre-Procedure History & Physical: HPI:  Robin [name] is a 57 y.o. female here for followup of her dysphagia/GERD and some constipation. Saw her gynecologist. States she has a "fallen" urinary bladder but not bad enough for surgically repair. GERD symptoms require continued therapy with Protonix 40 mg twice daily.  Complains of not having a good bowel movement;  May go 3-4 times daily and pass small amounts of stool. States having a bowel movement is even worse the next morning after she has sex. Takes Benefiber 1 tablespoon daily. Takes MiraLax only sporadically. Feels bloated a good bit of the time. She is up-to-date on colonoscopy. Dysphagia to pills not an issue if she takes her pills with food-no difficulties, whatsoever, with foods like meat and bread. Also complains of low back pain shooting down the back side of her left leg intermittently. Planning to see Dr. Phillips Odor regarding this problem.  Past Medical History  Diagnosis Date  . GERD (gastroesophageal reflux disease)   . Constipation   . Anxiety disorder   . Hyperlipidemia   . Hypertension   . Chronic neck pain   . Schatzki's ring     non critical  . Hiatal hernia     small  . Sleep apnea     CPAP, Sleep study at [facility]  . Arthritis     Past Surgical History  Procedure Laterality Date  . Knee arthroscopy    . Cardiac catheterization    . Vaginal prolapse repair    . Rectocele repair    . Esophagogastroduodenoscopy  08/23/2008    JWJ:XBJY distal esophageal erosions consistent with mild erosive reflux esophagitis, otherwise unremarkable esophagus/ Tiny antral erosions of doubtful clinical significance, otherwise normal stomach, patent pylorus, normal D1 and D2  . Colonoscopy  2006    Dr. Franky Macho, hyperplastic polyps  . Colonoscopy  08/06/11    abnormal terminal ileum for 10cm, erosions, geographical ulceration. Bx  small bowel mucosa with prominent intramucosal lymphoid aggregates, slightly inflammed  . Esophagogastroduodenoscopy  08/06/11    small hh, noncritical Schatzki's ring s/p 58 F  . Abdominal hysterectomy    . Direct laryngoscopy  03/15/2012    Procedure: DIRECT LARYNGOSCOPY;  Surgeon: Flo Shanks, MD;  Location: [facility] OR;  Service: ENT;  Laterality: N/A; Dr. Wolicki--:>no foreign body seen. normal esophagus to 40cm  . Esophagoscopy  03/15/2012    Procedure: ESOPHAGOSCOPY;  Surgeon: Flo Shanks, MD;  Location: [facility] OR;  Service: ENT;  Laterality: N/A;  . Pituitary surgery  06/2012    benign tumor, surgeon at [facility]    Prior to Admission medications   Medication Sig Start Date End Date Taking? Authorizing Provider  aspirin 81 MG tablet Take 81 mg by mouth daily.     Yes Historical Provider, MD  BENICAR HCT 40-12.5 MG per tablet Take 1 tablet by mouth daily. 05/09/11  Yes Historical Provider, MD  buPROPion (WELLBUTRIN XL) 300 MG 24 hr tablet Take 300 mg by mouth every morning.  09/08/11  Yes Historical Provider, MD  CELEBREX 200 MG capsule Take 200 mg by mouth daily.  11/25/12  Yes Historical Provider, MD  colesevelam (WELCHOL) 625 MG tablet Take 1,250 mg by mouth 2 (two) times daily with a meal.    Yes Historical Provider, MD  escitalopram (LEXAPRO) 20 MG tablet Take 20 mg by mouth daily.  08/24/12  Yes Historical Provider, MD  estradiol (ESTRACE) 1 MG  tablet Take 1 mg by mouth daily.  09/10/11  Yes Historical Provider, MD  gabapentin (NEURONTIN) 300 MG capsule Take 300 mg by mouth 2 (two) times daily.  02/11/12  Yes Historical Provider, MD  ipratropium (ATROVENT) 0.03 % nasal spray Place 2 sprays into the nose 2 (two) times daily. 01/20/13  Yes Ethelda Chick, MD  levocetirizine (XYZAL) 5 MG tablet Take 5 mg by mouth daily.    Yes Historical Provider, MD  meloxicam (MOBIC) 15 MG tablet Take 15 mg by mouth daily as needed.  01/25/12  Yes Historical Provider, MD  Multiple Vitamin (MULTIVITAMIN) tablet Take 1  tablet by mouth daily.     Yes Historical Provider, MD  pantoprazole (PROTONIX) 40 MG tablet TAKE 1 TABLET BY MOUTH TWICE A DAY 03/24/13  Yes Corbin Ade, MD  Vitamin D, Ergocalciferol, (DRISDOL) 50000 UNITS CAPS Take 1 capsule by mouth every 7 (seven) days. 01/12/12  Yes Historical Provider, MD  amoxicillin-clavulanate (AUGMENTIN) 875-125 MG per tablet Take 1 tablet by mouth 2 (two) times daily. 01/20/13   Ethelda Chick, MD  benzonatate (TESSALON) 100 MG capsule Take 1-2 capsules (100-200 mg total) by mouth 3 (three) times daily as needed for cough. 01/20/13   Ethelda Chick, MD  fluconazole (DIFLUCAN) 150 MG tablet Take 1 tablet (150 mg total) by mouth once. Repeat if needed 02/04/13   Godfrey Pick, PA-C  fluticasone (FLONASE) 50 MCG/ACT nasal spray Place 2 sprays into the nose daily as needed. For congestion 11/11/11 11/10/12  Lamar Laundry, MD    Allergies as of 07/11/2013  . (No Known Allergies)    Family History  Problem Relation Age of Onset  . Kidney cancer Father   . Stomach cancer      aunt  . Breast cancer      grandmother  . Colon cancer Neg Hx     History   Social History  . Marital Status: Married    Spouse Name: N/A    Number of Children: 4  . Years of Education: N/A   Occupational History  .     Social History Main Topics  . Smoking status: Former Smoker -- 0.50 packs/day    Types: Cigarettes  . Smokeless tobacco: Not on file     Comment: 17 yrs ago  . Alcohol Use: No  . Drug Use: No  . Sexual Activity: Not on file   Other Topics Concern  . Not on file   Social History Narrative  . No narrative on file    Review of Systems: See HPI, otherwise negative ROS  Physical Exam: BP 131/75  Pulse 62  Temp(Src) 97.6 F (36.4 C) (Oral)  Ht 5\' 8"  (1.727 m)  Wt 275 lb 9.6 oz (125.011 kg)  BMI 41.91 kg/m2 General:  Morbidly obese. Alert,  Well-developed, well-nourished, pleasant and cooperative in NAD Skin:  Intact without significant lesions or  rashes. Eyes:  Sclera clear, no icterus.   Conjunctiva pink. Ears:  Normal auditory acuity. Nose:  No deformity, discharge,  or lesions. Mouth:  No deformity or lesions. Neck:  Supple; no masses or thyromegaly. No significant cervical adenopathy. Lungs:  Clear throughout to auscultation.   No wheezes, crackles, or rhonchi. No acute distress. Heart:  Regular rate and rhythm; no murmurs, clicks, rubs,  or gallops. Abdomen: Non-distended, normal bowel sounds.  Soft and nontender without appreciable mass or hepatosplenomegaly.  Pulses:  Normal pulses noted. Extremities:  Without clubbing or edema.  Impression:  57 year old  lady with GERD and a nonspecific esophageal motility disorder. Getting along fairly well as far as dysphagia symptoms are concerned. She remains morbidly obese. We talked about the multipronged approach to GERD, the importance of weight loss specifically discussed..  Abdominal bloating and symptoms of poor evacuation. She may have a functional outlet issue.  Less likely colonic inertia.  Recommendations:   Increase Benefiber to 1 tablespoon twice daily. Stop MiraLax; begin Linzess 145 once daily. Samples provided. Patient is to give [name] a telephone progress report in 2 weeks. Fully agreed with following up with Dr. Phillips Odor regarding her lower back and the left thigh pain.

## 2013-07-26 ENCOUNTER — Telehealth: Payer: Self-pay | Admitting: *Deleted

## 2013-07-26 NOTE — Telephone Encounter (Signed)
Pt called to let Dr. Jena Gauss know the medication is working for her, but if she misses a day she has constipation, pt thinks medication turns real quick one day she is going a lot and the next she cant go hardly at all. please advise [phone] RX LINZESS 145MG . After pt empties self out it makes her feel real bad.

## 2013-07-27 NOTE — Telephone Encounter (Signed)
Continue Linzess 145 every day. Please call in prescription for 30 capsules with one years refill

## 2013-07-27 NOTE — Telephone Encounter (Signed)
Spoke with pt- Linzess 145 is working well as long as she takes it everyday. If she misses a dose her stool will get very hard. After she has a BM, she feels really bad with some nausea. Pt wants to know if she should continue the medication and if so she will need and rx sent in.   Pt uses [facility], [place].

## 2013-07-28 MED ORDER — LINACLOTIDE 145 MCG PO CAPS: 145.0000 ug | ORAL_CAPSULE | Freq: Every day | ORAL | Status: DC

## 2013-07-28 NOTE — Telephone Encounter (Signed)
Pt aware.

## 2013-07-28 NOTE — Telephone Encounter (Signed)
rx sent to the pharmacy. 

## 2013-08-01 NOTE — Telephone Encounter (Signed)
Pt called requesting samples of Linzess. Gave #3 boxes of Linzess 145 mcg

## 2013-08-01 NOTE — Telephone Encounter (Signed)
Noted  

## 2013-09-14 ENCOUNTER — Telehealth: Payer: Self-pay | Admitting: Internal Medicine

## 2013-09-14 NOTE — Telephone Encounter (Signed)
Pt was asking if we had anymore Linzess coupons she could have. [phone]

## 2013-09-15 ENCOUNTER — Telehealth: Payer: Self-pay | Admitting: Internal Medicine

## 2013-09-15 NOTE — Telephone Encounter (Signed)
pts can only use card one time. Gave #3 boxes of linzess . SS informed pt.

## 2013-09-15 NOTE — Telephone Encounter (Signed)
Pt is coming to pick up Linzess samples and said that she has a Flex Pay card that requires her doctor to write a rx for her benefiber and probiotic where she can get it cheaper. She asked if we could also do that for her.

## 2013-09-20 MED ORDER — ALIGN 4 MG PO CAPS: 4.0000 mg | ORAL_CAPSULE | Freq: Every day | ORAL | Status: DC

## 2013-09-20 MED ORDER — BENEFIBER PO POWD: ORAL | Status: DC

## 2013-09-20 NOTE — Telephone Encounter (Signed)
RXs done. 

## 2013-09-20 NOTE — Telephone Encounter (Signed)
Routing to LSL for review. 

## 2013-09-20 NOTE — Addendum Note (Signed)
Addended by: Tiffany Kocher on: 09/20/2013 03:58 PM   Modules accepted: Orders

## 2013-09-21 NOTE — Telephone Encounter (Signed)
Pt is aware. Align rx printed- faxed to pharmacy.

## 2013-11-15 ENCOUNTER — Ambulatory Visit (INDEPENDENT_AMBULATORY_CARE_PROVIDER_SITE_OTHER): Payer: Managed Care, Other (non HMO) | Admitting: Family Medicine

## 2013-11-15 VITALS — BP 122/68 | HR 99 | Temp 97.9°F | Resp 18 | Ht 67.5 in | Wt 283.0 lb

## 2013-11-15 DIAGNOSIS — B9789 Other viral agents as the cause of diseases classified elsewhere: Secondary | ICD-10-CM

## 2013-11-15 DIAGNOSIS — J988 Other specified respiratory disorders: Principal | ICD-10-CM

## 2013-11-15 DIAGNOSIS — J209 Acute bronchitis, unspecified: Secondary | ICD-10-CM

## 2013-11-15 DIAGNOSIS — J329 Chronic sinusitis, unspecified: Secondary | ICD-10-CM

## 2013-11-15 DIAGNOSIS — N898 Other specified noninflammatory disorders of vagina: Secondary | ICD-10-CM

## 2013-11-15 MED ORDER — HYDROCODONE-HOMATROPINE 5-1.5 MG/5ML PO SYRP: 5.0000 mL | ORAL_SOLUTION | ORAL | Status: DC | PRN

## 2013-11-15 MED ORDER — AMOXICILLIN 875 MG PO TABS: 875.0000 mg | ORAL_TABLET | Freq: Two times a day (BID) | ORAL | Status: DC

## 2013-11-15 MED ORDER — FLUCONAZOLE 150 MG PO TABS: 150.0000 mg | ORAL_TABLET | Freq: Once | ORAL | Status: DC

## 2013-11-15 NOTE — Progress Notes (Signed)
Subjective: Patient is here with a five-day history of congestion, sore throat, generalized malaise, low-grade fever, cough, and some nausea without vomiting. She does not work a job. She does not smoke. She did get a flu shot this year. Cough does bother her sleep. No history of diabetes.  Objective: Pleasant lady in no major distress but doesn't feel good. Her TMs are normal. Throat has mild erythema with some purulent postnasal drainage running down over the right tonsil in a yellow streak. Neck supple without significant nodes. Chest is clear to auscultation. Heart regular without murmurs.  Assessment: Flulike illness with residual sinusitis/bronchitis  Plan: Fluids, rest, and histamine decongestants, cough syrup, and amoxicillin. She says she often gets yeast infections when she takes antibiotics.

## 2013-11-15 NOTE — Patient Instructions (Signed)
Drink plenty of fluids and get enough rest  Take the amoxicillin one twice daily for infection  If you develop a yeast infection you can take the Diflucan  Use the cough syrup 1 teaspoon every 4-6 hours as needed  Return if worse

## 2013-12-01 ENCOUNTER — Other Ambulatory Visit: Payer: Self-pay | Admitting: Gastroenterology

## 2013-12-25 ENCOUNTER — Telehealth: Payer: Self-pay | Admitting: Internal Medicine

## 2013-12-25 NOTE — Telephone Encounter (Signed)
Patient is asking for samples of Linzess, please advise?

## 2013-12-26 NOTE — Telephone Encounter (Signed)
#  3 boxes of linzess 145 left up front for the pt.

## 2014-01-15 ENCOUNTER — Other Ambulatory Visit (HOSPITAL_COMMUNITY): Payer: Self-pay | Admitting: "Endocrinology

## 2014-01-15 DIAGNOSIS — E049 Nontoxic goiter, unspecified: Secondary | ICD-10-CM

## 2014-01-17 ENCOUNTER — Ambulatory Visit: Payer: Managed Care, Other (non HMO) | Admitting: Dietician

## 2014-01-29 ENCOUNTER — Other Ambulatory Visit: Payer: Self-pay | Admitting: Otolaryngology

## 2014-01-29 ENCOUNTER — Ambulatory Visit
Admission: RE | Admit: 2014-01-29 | Discharge: 2014-01-29 | Disposition: A | Discharge status: Home / Self Care | Payer: Managed Care, Other (non HMO) | Source: Ambulatory Visit | Attending: Otolaryngology | Admitting: Otolaryngology

## 2014-01-29 DIAGNOSIS — T17208A Unspecified foreign body in pharynx causing other injury, initial encounter: Secondary | ICD-10-CM

## 2014-01-31 ENCOUNTER — Ambulatory Visit (HOSPITAL_COMMUNITY): Payer: Managed Care, Other (non HMO)

## 2014-02-01 ENCOUNTER — Ambulatory Visit (HOSPITAL_COMMUNITY)
Admission: RE | Admit: 2014-02-01 | Discharge: 2014-02-01 | Disposition: A | Discharge status: Home / Self Care | Payer: Managed Care, Other (non HMO) | Source: Ambulatory Visit | Attending: "Endocrinology | Admitting: "Endocrinology

## 2014-02-01 ENCOUNTER — Ambulatory Visit: Payer: Managed Care, Other (non HMO) | Admitting: Internal Medicine

## 2014-02-01 DIAGNOSIS — R599 Enlarged lymph nodes, unspecified: Secondary | ICD-10-CM | POA: Insufficient documentation

## 2014-02-01 DIAGNOSIS — E049 Nontoxic goiter, unspecified: Secondary | ICD-10-CM | POA: Insufficient documentation

## 2014-02-20 ENCOUNTER — Encounter: Payer: Self-pay | Admitting: *Deleted

## 2014-02-20 ENCOUNTER — Encounter: Payer: Managed Care, Other (non HMO) | Attending: Family Medicine | Admitting: *Deleted

## 2014-02-20 ENCOUNTER — Ambulatory Visit: Payer: Managed Care, Other (non HMO) | Admitting: Internal Medicine

## 2014-02-20 VITALS — Ht 68.0 in | Wt 278.0 lb

## 2014-02-20 DIAGNOSIS — E039 Hypothyroidism, unspecified: Secondary | ICD-10-CM | POA: Insufficient documentation

## 2014-02-20 DIAGNOSIS — R7309 Other abnormal glucose: Secondary | ICD-10-CM | POA: Insufficient documentation

## 2014-02-20 DIAGNOSIS — Z713 Dietary counseling and surveillance: Secondary | ICD-10-CM | POA: Insufficient documentation

## 2014-02-20 DIAGNOSIS — E669 Obesity, unspecified: Secondary | ICD-10-CM | POA: Insufficient documentation

## 2014-02-20 NOTE — Patient Instructions (Signed)
Goals:  Eat 3 meals/day, Avoid meal skipping   Increase protein rich foods  Follow "Plate Method" for portion control  Limit carbohydrate1-2 servings/meal   Choose more whole grains, lean protein, low-fat dairy, and fruits/non-starchy vegetables.   Aim for >30 min of physical activity daily  Limit sugar-sweetened beverages and concentrated sweets   

## 2014-02-20 NOTE — Progress Notes (Signed)
  Medical Nutrition Therapy:  Appt start time: 1100 end time:  1200.  Assessment:  Primary concerns today: Robin [name] is here for nutrition counseling pertaining to obesity.  She recently found out that she has prediabetes.  She has hypothyroidism and she's been on levothyroixine.  She was recently prescribed metformin and qsymia for weight loss.  Sh ehas been instructed to lose 15 pounds in 3 months.   She reports weighing 175 lb at her lowest and her heaviest weight was 288 lb.   She reports not having a consistent weight.  She would like to weigh 190-200 pounds.  This entails about a 80-100 pound weight loss.  Robin [name] lives with her husband and her elderly mother.  Robin [name] does the grocery shopping and the cooking.   She reports using a variety of cooking methods.  She states she doesn't eat out much.  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  Her mother has multiple appointments so they are always out and running around.  Her schedule is quite erratic. Robin [name] is actually going to visit her sister for 2 months  Preferred Learning Style:   Auditory  Visual   Learning Readiness:   Ready   MEDICATIONS: see list.  Qysmia for weight loss   DIETARY INTAKE:  Usual eating pattern includes 2 meals and 1 snacks per day.  Everyday foods include energy-dense proteins, starches.  Avoided foods include none.    24-hr recall:  B ( AM): sausage, egg, maybe bacon, sometimes grits, maybe a piece of toast.  Coffee and OJ.  Sometimes has fruit with it Snk ( AM): not usually  L ( PM): sometimes skips if she's busy.  Sometimes Robin [name] will want fast food, but Robin [name] won't eat Snk ( PM): might grab sandwich or leftovers D ( PM): overeats because she didn't get enough during the day.  Has a lot of chicken, usually baked.  Sometimes has mashed potatoes anda green beans.  Tries to have vegetable most nights she cooks.  Now she's not cooking as much Snk ( PM): loves sweets, but  tries to limit Beverages: soda usually, but is trying to limit to 2 cup/day; is trying to increase water  Usual physical activity: none currently.  Plans to join the [name]  Estimated energy needs: 1600 calories 180 g carbohydrates 120 g protein 44 g fat    Nutritional Diagnosis:  NB-1.1 Food and nutrition-related knowledge deficit As related to proper balance of fats, carbohydrates, and proteins needed for weight maitenance.  As evidenced by patient self-report.    Intervention:  Nutrition counseling provided.  Discussed metabolic effects of meal skipping and encouraged 3 meals/day.  Discussed MyPlate recommendations for meal planning and the importance of regular physical activity.  Goals:  Eat 3 meals/day, Avoid meal skipping   Increase protein rich foods  Follow "Plate Method" for portion control  Limit carbohydrate1-2 servings/meal   Choose more whole grains, lean protein, low-fat dairy, and fruits/non-starchy vegetables.   Aim for >30 min of physical activity daily  Limit sugar-sweetened beverages and concentrated sweets    Teaching Method Utilized:  Visual Auditory   Handouts given during visit include:  Meal plan card  Barriers to learning/adherence to lifestyle change: needing to care for elderly mother  Demonstrated degree of understanding via:  Teach Back   Monitoring/Evaluation:  Dietary intake, exercise, and body weight in 3 month(s).

## 2014-03-20 ENCOUNTER — Ambulatory Visit: Payer: Managed Care, Other (non HMO) | Admitting: Internal Medicine

## 2014-04-27 ENCOUNTER — Encounter (INDEPENDENT_AMBULATORY_CARE_PROVIDER_SITE_OTHER): Payer: Self-pay

## 2014-04-27 ENCOUNTER — Ambulatory Visit (INDEPENDENT_AMBULATORY_CARE_PROVIDER_SITE_OTHER): Payer: Managed Care, Other (non HMO) | Admitting: Internal Medicine

## 2014-04-27 ENCOUNTER — Encounter: Payer: Self-pay | Admitting: Internal Medicine

## 2014-04-27 VITALS — BP 139/77 | HR 69 | Temp 97.5°F | Ht 68.0 in | Wt 270.8 lb

## 2014-04-27 DIAGNOSIS — K219 Gastro-esophageal reflux disease without esophagitis: Secondary | ICD-10-CM

## 2014-04-27 NOTE — Progress Notes (Signed)
Primary Care Physician:  Purvis Kilts, MD Primary Gastroenterologist:  Dr. [name]  Pre-Procedure History & Physical: HPI:  Robin [name] is a 58 y.o. female here for followup. Still with some abdominal bloating although she feels bowels moving a little better but lens S1 45. Still pass a hard stool in the evening. Some dysphagia to liquids but not solids. Thyroid medication has been manipulated recently. Glucophage added. Phentermine added to her regimen. She has lost 5 pounds since her last office visit  Past Medical History  Diagnosis Date  . GERD (gastroesophageal reflux disease)   . Constipation   . Anxiety disorder   . Hyperlipidemia   . Hypertension   . Chronic neck pain   . Schatzki's ring     non critical  . Hiatal hernia     small  . Sleep apnea     CPAP, Sleep study at [facility]  . Arthritis     Past Surgical History  Procedure Laterality Date  . Knee arthroscopy    . Cardiac catheterization    . Vaginal prolapse repair    . Rectocele repair    . Esophagogastroduodenoscopy  08/23/2008    PRF:FMBW distal esophageal erosions consistent with mild erosive reflux esophagitis, otherwise unremarkable esophagus/ Tiny antral erosions of doubtful clinical significance, otherwise normal stomach, patent pylorus, normal D1 and D2  . Colonoscopy  2006    Dr. [name], hyperplastic polyps  . Colonoscopy  08/06/11    abnormal terminal ileum for 10cm, erosions, geographical ulceration. Bx small bowel mucosa with prominent intramucosal lymphoid aggregates, slightly inflammed  . Esophagogastroduodenoscopy  08/06/11    small hh, noncritical Schatzki's ring s/p 61 F  . Abdominal hysterectomy    . Direct laryngoscopy  03/15/2012    Procedure: DIRECT LARYNGOSCOPY;  Surgeon: [name], MD;  Location: [place];  Service: ENT;  Laterality: N/A; Dr. Wolicki--:>no foreign body seen. normal esophagus to 40cm  . Esophagoscopy  03/15/2012    Procedure: ESOPHAGOSCOPY;   Surgeon: [name], MD;  Location: [facility] OR;  Service: ENT;  Laterality: N/A;  . 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 Authorizing Provider  aspirin 81 MG tablet Take 81 mg by mouth daily.     Yes Historical Provider, MD  buPROPion (WELLBUTRIN XL) 300 MG 24 hr tablet Take 300 mg by mouth every morning.  09/08/11  Yes Historical Provider, MD  CELEBREX 200 MG capsule Take 200 mg by mouth daily.  11/25/12  Yes Historical Provider, MD  co-enzyme Q-10 30 MG capsule Take 30 mg by mouth 3 (three) times daily.   Yes Historical Provider, MD  colesevelam (WELCHOL) 625 MG tablet Take 1,250 mg by mouth 2 (two) times daily with a meal.    Yes Historical Provider, MD  escitalopram (LEXAPRO) 20 MG tablet Take 20 mg by mouth daily.  08/24/12  Yes Historical Provider, MD  estradiol (ESTRACE) 1 MG tablet Take 1 mg by mouth daily.  09/10/11  Yes Historical Provider, MD  Fish Oil OIL by Does not apply route.   Yes Historical Provider, MD  gabapentin (NEURONTIN) 300 MG capsule Take 300 mg by mouth 2 (two) times daily.  02/11/12  Yes Historical Provider, MD  ipratropium (ATROVENT) 0.03 % nasal spray Place 2 sprays into the nose 2 (two) times daily. 01/20/13  Yes Wardell Honour, MD  levocetirizine Harlow Ohms)  5 MG tablet Take 5 mg by mouth daily.    Yes Historical Provider, MD  levothyroxine (SYNTHROID, LEVOTHROID) 50 MCG tablet Take 50 mcg by mouth daily before breakfast.   Yes Historical Provider, MD  Linaclotide (LINZESS) 145 MCG CAPS capsule Take 1 capsule (145 mcg total) by mouth daily. 07/28/13  Yes [name], MD  losartan-hydrochlorothiazide (HYZAAR) 100-25 MG per tablet Take 1 tablet by mouth daily.   Yes Historical Provider, MD  metformin (FORTAMET) 500 MG (OSM) 24 hr tablet Take 500 mg by mouth daily with breakfast.   Yes Historical Provider, MD  Multiple Vitamin (MULTIVITAMIN) tablet Take 1 tablet by mouth  daily.     Yes Historical Provider, MD  pantoprazole (PROTONIX) 40 MG tablet TAKE 1 TABLET BY MOUTH TWICE A DAY 03/24/13  Yes [name], MD  Phentermine-Topiramate 7.5-46 MG CP24 Take by mouth daily.    Yes Historical Provider, MD  Probiotic Product (ALIGN) 4 MG CAPS Take 4 mg by mouth daily. 09/20/13  Yes Mahala Menghini, PA-C  Wheat Dextrin (BENEFIBER) POWD One tablespoon twice a day. 09/20/13  Yes Mahala Menghini, PA-C  fluticasone (FLONASE) 50 MCG/ACT nasal spray Place 2 sprays into the nose daily as needed. For congestion 11/11/11 11/10/12  Elige Radon, MD    Allergies as of 04/27/2014  . (No Known Allergies)    Family History  Problem Relation Age of Onset  . Kidney cancer Father   . Stomach cancer      aunt  . Breast cancer      grandmother  . Colon cancer Neg Hx     History   Social History  . Marital Status: Married    Spouse Name: N/A    Number of Children: 4  . Years of Education: N/A   Occupational History  .     Social History Main Topics  . Smoking status: Former Smoker -- 0.50 packs/day    Types: Cigarettes  . Smokeless tobacco: Not on file     Comment: 17 yrs ago  . Alcohol Use: No  . Drug Use: No  . Sexual Activity: Not on file   Other Topics Concern  . Not on file   Social History Narrative  . No narrative on file    Review of Systems: See HPI, otherwise negative ROS  Physical Exam: BP 139/77  Pulse 69  Temp(Src) 97.5 F (36.4 C) (Oral)  Ht 5\' 8"  (1.727 m)  Wt 270 lb 12.8 oz (122.834 kg)  BMI 41.18 kg/m2 General:   Alert,  Well-developed, well-nourished, pleasant and cooperative in NAD Skin:  Intact without significant lesions or rashes. Eyes:  Sclera clear, no icterus.   Conjunctiva pink. Ears:  Normal auditory acuity. Nose:  No deformity, discharge,  or lesions. Mouth:  No deformity or lesions. Neck:  Supple; no masses or thyromegaly. No significant cervical adenopathy. Lungs:  Clear throughout to auscultation.   No wheezes,  crackles, or rhonchi. No acute distress. Heart:  Regular rate and rhythm; no murmurs, clicks, rubs,  or gallops. Abdomen: Obese. Non-distended, normal bowel sounds.  Soft and nontender without appreciable mass or hepatosplenomegaly.  Pulses:  Normal pulses noted. Extremities:  Without clubbing or edema.  Impression:  Bloating likely related to a significant degree to constipation. GERD symptoms well controlled on Protonix. She is commended on weight loss thus far. She likely has an underlying esophageal motility disorder. I do not feel there is much more due for this other than suppress acid  at this time..   Recommendations:  Continue Benefiber twice daily   Continue Protonix 40 mg twice daily  Increase Linzess to 290 daily   Office visit with [name] in 6 weeks     Notice: This dictation was prepared with Dragon dictation along with smaller phrase technology. Any transcriptional errors that result from this process are unintentional and may not be corrected upon review.

## 2014-04-27 NOTE — Patient Instructions (Signed)
Continue Protonix 40 mg twice daily   Continue Benefiber twice daily   Increase Linzess to 290 daily   Office visit with [name] in 76 weeks

## 2014-05-01 NOTE — Progress Notes (Signed)
Pt is aware of OV on 8/28 at 10 with RMR. Pt wants RMR only

## 2014-05-15 ENCOUNTER — Telehealth: Payer: Self-pay | Admitting: Internal Medicine

## 2014-05-15 NOTE — Telephone Encounter (Signed)
Pt seen RMR in office recently and he increased her Linzess to 290mg . Patient said that Rx was too much ($30) and she wanted to know if he could recommend something cheaper. Nurse said that we could give her enough samples to hold her until her OV with RMR on 8/28 and she can address her concerns with him then. Pt agreed.

## 2014-05-15 NOTE — Telephone Encounter (Signed)
#  3 bottles of Linzess 290 mcg given to pt.

## 2014-05-24 ENCOUNTER — Encounter: Payer: Managed Care, Other (non HMO) | Attending: Family Medicine | Admitting: *Deleted

## 2014-05-24 DIAGNOSIS — E669 Obesity, unspecified: Secondary | ICD-10-CM | POA: Diagnosis not present

## 2014-05-24 DIAGNOSIS — Z713 Dietary counseling and surveillance: Secondary | ICD-10-CM | POA: Insufficient documentation

## 2014-05-24 NOTE — Progress Notes (Signed)
  Medical Nutrition Therapy:  Appt start time: 1130 end time:  1200.  Assessment:  Primary concerns today: Robin [name] is here for follow up nutrition counseling pertaining to obesity.  She has made all the changes I suggested last time and has lost about 7 pounds! She's increased her activity, fruits, vegetables, lean meats, and water consumption; she avoids meal skipping; and have cut back on processed foods and sugary beverages.  She's proud of herself and feels better than before.   Preferred Learning Style:   Auditory  Visual   Learning Readiness:   Change in progress   MEDICATIONS: see list.  Qysmia for weight loss   DIETARY INTAKE:  Usual eating pattern includes 2 meals and 1 snacks per day.  Everyday foods include energy-dense proteins, starches.  Avoided foods include none.    24-hr recall:  B ( AM): egg, sausage or bacon, toast and applesauce.  Sometimes coffee or OJ.  Sometimes yogurt or oatmeal and egg Snk ( AM): not usually  L ( PM): chicken and small salad Snk ( PM): pack of Belvita and yogurt or fruit D ( PM): chicken with vegetable and baked potato.  Has cut back majorly on her dinner portions Snk ( PM):fruit or yogurt or Belvita Beverages: more water, 8 oz gingerale, sometimes OJ  Usual physical activity: was walking 3 days/week, but injured her hip.  Now does water aerobics or swims 3 days/week  Estimated energy needs: 1600 calories 180 g carbohydrates 120 g protein 44 g fat    Nutritional Diagnosis:  NB-1.1 Food and nutrition-related knowledge deficit As related to proper balance of fats, carbohydrates, and proteins needed for weight maitenance.  As evidenced by patient self-report.    Intervention:  Nutrition counseling provided. Praised Robin [name] for her progress.  Encouraged her to have her hip looked at.  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    Continue previous Goals:  Eat 3 meals/day, Avoid meal skipping   Increase protein rich foods  Follow "Plate Method" for portion control  Limit carbohydrate1-2 servings/meal   Choose more whole grains, lean protein, low-fat dairy, and fruits/non-starchy vegetables.   Aim for >30 min of physical activity daily  Limit sugar-sweetened beverages and concentrated sweets    Teaching Method Utilized:  Auditory    Barriers to learning/adherence to lifestyle change: needing to care for elderly mother  Demonstrated degree of understanding via:  Teach Back   Monitoring/Evaluation:  Dietary intake, exercise, and body weight in 3 month(s).

## 2014-06-01 ENCOUNTER — Encounter: Payer: Self-pay | Admitting: Internal Medicine

## 2014-06-01 ENCOUNTER — Ambulatory Visit (INDEPENDENT_AMBULATORY_CARE_PROVIDER_SITE_OTHER): Payer: Managed Care, Other (non HMO) | Admitting: Internal Medicine

## 2014-06-01 ENCOUNTER — Encounter (INDEPENDENT_AMBULATORY_CARE_PROVIDER_SITE_OTHER): Payer: Self-pay

## 2014-06-01 VITALS — BP 135/77 | HR 73 | Temp 97.9°F | Ht 68.0 in | Wt 266.6 lb

## 2014-06-01 DIAGNOSIS — K5900 Constipation, unspecified: Secondary | ICD-10-CM

## 2014-06-01 DIAGNOSIS — K7689 Other specified diseases of liver: Secondary | ICD-10-CM

## 2014-06-01 DIAGNOSIS — R197 Diarrhea, unspecified: Secondary | ICD-10-CM

## 2014-06-01 DIAGNOSIS — K7581 Nonalcoholic steatohepatitis (NASH): Secondary | ICD-10-CM

## 2014-06-01 DIAGNOSIS — K219 Gastro-esophageal reflux disease without esophagitis: Secondary | ICD-10-CM

## 2014-06-01 NOTE — Patient Instructions (Signed)
Continue Linzess 290 each morning  Take a dose of Miralax (17 grams mid morning daily)  Stop protonix; begin Dexilant 60 mg daily - samples provided  Office visit 6 weeks

## 2014-06-01 NOTE — Progress Notes (Signed)
Primary Care Physician:  Purvis Kilts, MD Primary Gastroenterologist:  Dr. [name]  Pre-Procedure History & Physical: HPI:  Robin [name] is a 58 y.o. female here for followup of GERD and constipation. Takes Linzess 290 in the morning. Has a good bowel movement the morning and then as her to have stool later in the day passes harder "balls". Not taking MiraLax. Also having worsening of her reflux symptoms on Protonix 40 mg daily. No dysphagia. Continues to lose weight however (conscious effort).  Past Medical History  Diagnosis Date  . GERD (gastroesophageal reflux disease)   . Constipation   . Anxiety disorder   . Hyperlipidemia   . Hypertension   . Chronic neck pain   . Schatzki's ring     non critical  . Hiatal hernia     small  . Sleep apnea     CPAP, Sleep study at [facility]  . Arthritis     Past Surgical History  Procedure Laterality Date  . Knee arthroscopy    . Cardiac catheterization    . Vaginal prolapse repair    . Rectocele repair    . Esophagogastroduodenoscopy  08/23/2008    DDU:KGUR distal esophageal erosions consistent with mild erosive reflux esophagitis, otherwise unremarkable esophagus/ Tiny antral erosions of doubtful clinical significance, otherwise normal stomach, patent pylorus, normal D1 and D2  . Colonoscopy  2006    Dr. [name], hyperplastic polyps  . Colonoscopy  08/06/11    abnormal terminal ileum for 10cm, erosions, geographical ulceration. Bx small bowel mucosa with prominent intramucosal lymphoid aggregates, slightly inflammed  . Esophagogastroduodenoscopy  08/06/11    small hh, noncritical Schatzki's ring s/p 2 F  . Abdominal hysterectomy    . Direct laryngoscopy  03/15/2012    Procedure: DIRECT LARYNGOSCOPY;  Surgeon: [name], MD;  Location: [facility];  Service: ENT;  Laterality: N/A; Dr. Wolicki--:>no foreign body seen. normal esophagus to 40cm  . Esophagoscopy  03/15/2012    Procedure: ESOPHAGOSCOPY;  Surgeon: [name], MD;  Location: [facility] OR;  Service: ENT;  Laterality: N/A;  . 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 Authorizing Provider  aspirin 81 MG tablet Take 81 mg by mouth daily.     Yes Historical Provider, MD  buPROPion (WELLBUTRIN XL) 300 MG 24 hr tablet Take 300 mg by mouth every morning.  09/08/11  Yes Historical Provider, MD  CELEBREX 200 MG capsule Take 200 mg by mouth daily.  11/25/12  Yes Historical Provider, MD  co-enzyme Q-10 30 MG capsule Take 30 mg by mouth 3 (three) times daily.   Yes Historical Provider, MD  colesevelam (WELCHOL) 625 MG tablet Take 1,250 mg by mouth 2 (two) times daily with a meal.    Yes Historical Provider, MD  escitalopram (LEXAPRO) 20 MG tablet Take 20 mg by mouth daily.  08/24/12  Yes Historical Provider, MD  estradiol (ESTRACE) 1 MG tablet Take 1 mg by mouth daily.  09/10/11  Yes Historical Provider, MD  Fish Oil OIL by Does not apply route.   Yes Historical Provider, MD  gabapentin (NEURONTIN) 300 MG capsule Take 300 mg by mouth 2 (two) times daily.  02/11/12  Yes Historical Provider, MD  ipratropium (ATROVENT) 0.03 % nasal spray Place 2 sprays into the nose 2 (two) times daily. 01/20/13  Yes Wardell Honour, MD  levocetirizine Harlow Ohms)  5 MG tablet Take 5 mg by mouth daily.    Yes Historical Provider, MD  levothyroxine (SYNTHROID, LEVOTHROID) 50 MCG tablet Take 50 mcg by mouth daily before breakfast.   Yes Historical Provider, MD  Linaclotide (LINZESS) 290 MCG CAPS capsule Take 290 mcg by mouth daily.   Yes Historical Provider, MD  losartan-hydrochlorothiazide (HYZAAR) 100-25 MG per tablet Take 1 tablet by mouth daily.   Yes Historical Provider, MD  metformin (FORTAMET) 500 MG (OSM) 24 hr tablet Take 500 mg by mouth daily with breakfast.   Yes Historical Provider, MD  Multiple Vitamin (MULTIVITAMIN) tablet Take 1 tablet by mouth daily.     Yes Historical Provider, MD   pantoprazole (PROTONIX) 40 MG tablet TAKE 1 TABLET BY MOUTH TWICE A DAY 03/24/13  Yes [name], MD  Phentermine-Topiramate 7.5-46 MG CP24 Take by mouth daily.    Yes Historical Provider, MD  Probiotic Product (ALIGN) 4 MG CAPS Take 4 mg by mouth daily. 09/20/13  Yes Mahala Menghini, PA-C  Wheat Dextrin (BENEFIBER) POWD One tablespoon twice a day. 09/20/13  Yes Mahala Menghini, PA-C  fluticasone (FLONASE) 50 MCG/ACT nasal spray Place 2 sprays into the nose daily as needed. For congestion 11/11/11 11/10/12  Elige Radon, MD    Allergies as of 06/01/2014  . (No Known Allergies)    Family History  Problem Relation Age of Onset  . Kidney cancer Father   . Stomach cancer      aunt  . Breast cancer      grandmother  . Colon cancer Neg Hx     History   Social History  . Marital Status: Married    Spouse Name: N/A    Number of Children: 4  . Years of Education: N/A   Occupational History  .     Social History Main Topics  . Smoking status: Former Smoker -- 0.50 packs/day    Types: Cigarettes  . Smokeless tobacco: Not on file     Comment: 17 yrs ago  . Alcohol Use: No  . Drug Use: No  . Sexual Activity: Not on file   Other Topics Concern  . Not on file   Social History Narrative  . No narrative on file    Review of Systems: See HPI, otherwise negative ROS  Physical Exam: BP 135/77  Pulse 73  Temp(Src) 97.9 F (36.6 C) (Oral)  Ht 5\' 8"  (1.727 m)  Wt 266 lb 9.6 oz (120.929 kg)  BMI 40.55 kg/m2 General:   Alert,  Well-developed, well-nourished, pleasant and cooperative in NAD Skin:  Intact without significant lesions or rashes. Eyes:  Sclera clear, no icterus.   Conjunctiva pink. Ears:  Normal auditory acuity. Nose:  No deformity, discharge,  or lesions. Mouth:  No deformity or lesions. Neck:  Supple; no masses or thyromegaly. No significant cervical adenopathy. Lungs:  Clear throughout to auscultation.   No wheezes, crackles, or rhonchi. No acute  distress. Heart:  Regular rate and rhythm; no murmurs, clicks, rubs,  or gallops. Abdomen: Obese. Non-distended, normal bowel sounds.  Soft and nontender without appreciable mass or hepatosplenomegaly.  Pulses:  Normal pulses noted. Extremities:  Without clubbing or edema.  Impression:  Flare of GERD recently. I would expect steady improvement with weight loss. No alarm symptoms. Protonix  not working as good as it had previously. Still with some constipation although stooling 1-2 times daily.  WelChol likely contributing to constipation.   Recommendations:  Continue Linzess 290 each morning  Take a dose of Miralax (17 grams mid morning daily)  Stop protonix; begin Dexilant 60 mg daily - samples provided  Office visit 6 weeks    Notice: This dictation was prepared with Dragon dictation along with smaller phrase technology. Any transcriptional errors that result from this process are unintentional and may not be corrected upon review.

## 2014-06-18 ENCOUNTER — Telehealth: Payer: Self-pay | Admitting: Internal Medicine

## 2014-06-18 NOTE — Telephone Encounter (Signed)
Pt is asking about getting samples of dexilant and linzess. Please call [phone]

## 2014-06-20 NOTE — Telephone Encounter (Signed)
Pt is aware of samples up front and is unsure if she wants a prescription called in because it would be too expensive for her. I told her that her insurance could probably do a prior aurthorization to see if that would help with expense, but she was still unsure if she wanted to do that.

## 2014-06-20 NOTE — Telephone Encounter (Signed)
Pt called back again today to follow up on getting samples of dexilant and linzess. Please advise [phone]

## 2014-06-20 NOTE — Telephone Encounter (Signed)
#  2 boxes of dexilant and #2 boxes of linzess are ready. Does she need an Rx?

## 2014-07-02 ENCOUNTER — Telehealth: Payer: Self-pay | Admitting: Internal Medicine

## 2014-07-02 MED ORDER — DEXLANSOPRAZOLE 60 MG PO CPDR: 60.0000 mg | DELAYED_RELEASE_CAPSULE | Freq: Every day | ORAL | Status: DC

## 2014-07-02 NOTE — Addendum Note (Signed)
Addended by: Mahala Menghini on: 07/02/2014 10:08 PM   Modules accepted: Orders

## 2014-07-02 NOTE — Telephone Encounter (Signed)
Pt called to ask for [name] to call in a prescription for her Dexilant because she is out of samples. [facility] uses [facility] in [place] [phone])

## 2014-07-02 NOTE — Telephone Encounter (Signed)
done

## 2014-07-02 NOTE — Telephone Encounter (Signed)
Routing to refill box  

## 2014-07-10 ENCOUNTER — Encounter (INDEPENDENT_AMBULATORY_CARE_PROVIDER_SITE_OTHER): Payer: Self-pay

## 2014-07-10 ENCOUNTER — Encounter: Payer: Self-pay | Admitting: Internal Medicine

## 2014-07-10 ENCOUNTER — Ambulatory Visit (INDEPENDENT_AMBULATORY_CARE_PROVIDER_SITE_OTHER): Payer: Managed Care, Other (non HMO) | Admitting: Internal Medicine

## 2014-07-10 VITALS — BP 130/80 | HR 75 | Temp 97.3°F | Ht 68.0 in | Wt 267.5 lb

## 2014-07-10 DIAGNOSIS — K5909 Other constipation: Secondary | ICD-10-CM

## 2014-07-10 DIAGNOSIS — K219 Gastro-esophageal reflux disease without esophagitis: Secondary | ICD-10-CM

## 2014-07-10 DIAGNOSIS — R1314 Dysphagia, pharyngoesophageal phase: Secondary | ICD-10-CM

## 2014-07-10 NOTE — Patient Instructions (Signed)
Swallowing precautions reviewed  Get medications in elixir form as feasible  Continue Dexilant 60 mg daily  Continue Linzess and Miralax daily  Office visit in 6 months

## 2014-07-10 NOTE — Progress Notes (Signed)
Primary Care Physician:  Purvis Kilts, MD Primary Gastroenterologist:  Dr. [name]  Pre-Procedure History & Physical: HPI:  Robin [name] is a 58 y.o. female here for GERD, dysphagia and constipation. GERD symptoms fairly well controlled on Dexilant 60 mg daily. Works better than prior agents. Continues to have some intermittent pill dysphagia and no apparent  difficulties with solid food or liquids. Prior EGD with empiric esophageal dilation performed. Barium pill esophagram last year demonstrated a focally dilated thoracic esophagus with possibly an early diverticulum. No obstruction to passage of the barium pill. Feels that she gets pill stuck in the right side of her throat from time to time. She wants to see Dr. Dagmar Hait once again.  She has not lost any further weight. Past Medical History  Diagnosis Date  . GERD (gastroesophageal reflux disease)   . Constipation   . Anxiety disorder   . Hyperlipidemia   . Hypertension   . Chronic neck pain   . Schatzki's ring     non critical  . Hiatal hernia     small  . Sleep apnea     CPAP, Sleep study at [facility]  . Arthritis     Past Surgical History  Procedure Laterality Date  . Knee arthroscopy    . Cardiac catheterization    . Vaginal prolapse repair    . Rectocele repair    . Esophagogastroduodenoscopy  08/23/2008    LAG:TXMI distal esophageal erosions consistent with mild erosive reflux esophagitis, otherwise unremarkable esophagus/ Tiny antral erosions of doubtful clinical significance, otherwise normal stomach, patent pylorus, normal D1 and D2  . Colonoscopy  2006    Dr. [name], hyperplastic polyps  . Colonoscopy  08/06/11    abnormal terminal ileum for 10cm, erosions, geographical ulceration. Bx small bowel mucosa with prominent intramucosal lymphoid aggregates, slightly inflammed  . Esophagogastroduodenoscopy  08/06/11    small hh, noncritical Schatzki's ring s/p 53 F  . Abdominal hysterectomy    .  Direct laryngoscopy  03/15/2012    Procedure: DIRECT LARYNGOSCOPY;  Surgeon: [name], MD;  Location: [facility];  Service: ENT;  Laterality: N/A; Dr. Wolicki--:>no foreign body seen. normal esophagus to 40cm  . Esophagoscopy  03/15/2012    Procedure: ESOPHAGOSCOPY;  Surgeon: [name], MD;  Location: [facility] OR;  Service: ENT;  Laterality: N/A;  . 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 Authorizing Provider  aspirin 81 MG tablet Take 81 mg by mouth daily.     Yes Historical Provider, MD  buPROPion (WELLBUTRIN XL) 300 MG 24 hr tablet Take 300 mg by mouth every morning.  09/08/11  Yes Historical Provider, MD  CELEBREX 200 MG capsule Take 200 mg by mouth daily.  11/25/12  Yes Historical Provider, MD  co-enzyme Q-10 30 MG capsule Take 30 mg by mouth 3 (three) times daily.   Yes Historical Provider, MD  colesevelam (WELCHOL) 625 MG tablet Take 1,250 mg by mouth 2 (two) times daily with a meal.    Yes Historical Provider, MD  dexlansoprazole (DEXILANT) 60 MG capsule Take 1 capsule (60 mg total) by mouth daily. 07/02/14  Yes Mahala Menghini, PA-C  escitalopram (LEXAPRO) 20 MG tablet Take 20 mg by mouth daily.  08/24/12  Yes Historical Provider, MD  estradiol (ESTRACE) 1 MG tablet Take 1 mg by mouth daily.  09/10/11  Yes Historical Provider,  MD  Fish Oil OIL by Does not apply route.   Yes Historical Provider, MD  gabapentin (NEURONTIN) 300 MG capsule Take 300 mg by mouth 2 (two) times daily.  02/11/12  Yes Historical Provider, MD  ipratropium (ATROVENT) 0.03 % nasal spray Place 2 sprays into the nose 2 (two) times daily. 01/20/13  Yes Wardell Honour, MD  levocetirizine (XYZAL) 5 MG tablet Take 5 mg by mouth daily.    Yes Historical Provider, MD  levothyroxine (SYNTHROID, LEVOTHROID) 50 MCG tablet Take 50 mcg by mouth daily before breakfast.   Yes Historical Provider, MD  Linaclotide (LINZESS) 290 MCG CAPS  capsule Take 290 mcg by mouth daily.   Yes Historical Provider, MD  losartan-hydrochlorothiazide (HYZAAR) 100-25 MG per tablet Take 1 tablet by mouth daily.   Yes Historical Provider, MD  metformin (FORTAMET) 500 MG (OSM) 24 hr tablet Take 500 mg by mouth daily with breakfast.   Yes Historical Provider, MD  Multiple Vitamin (MULTIVITAMIN) tablet Take 1 tablet by mouth daily.     Yes Historical Provider, MD  pantoprazole (PROTONIX) 40 MG tablet TAKE 1 TABLET BY MOUTH TWICE A DAY 03/24/13  Yes [name], MD  Phentermine-Topiramate 7.5-46 MG CP24 Take by mouth daily.    Yes Historical Provider, MD  Probiotic Product (ALIGN) 4 MG CAPS Take 4 mg by mouth daily. 09/20/13  Yes Mahala Menghini, PA-C  Wheat Dextrin (BENEFIBER) POWD One tablespoon twice a day. 09/20/13  Yes Mahala Menghini, PA-C  fluticasone (FLONASE) 50 MCG/ACT nasal spray Place 2 sprays into the nose daily as needed. For congestion 11/11/11 11/10/12  Elige Radon, MD    Allergies as of 07/10/2014  . (No Known Allergies)    Family History  Problem Relation Age of Onset  . Kidney cancer Father   . Stomach cancer      aunt  . Breast cancer      grandmother  . Colon cancer Neg Hx     History   Social History  . Marital Status: Married    Spouse Name: N/A    Number of Children: 4  . Years of Education: N/A   Occupational History  .     Social History Main Topics  . Smoking status: Former Smoker -- 0.50 packs/day    Types: Cigarettes  . Smokeless tobacco: Not on file     Comment: 17 yrs ago  . Alcohol Use: No  . Drug Use: No  . Sexual Activity: Not on file   Other Topics Concern  . Not on file   Social History Narrative  . No narrative on file    Review of Systems: See HPI, otherwise negative ROS  Physical Exam: BP 130/80  Pulse 75  Temp(Src) 97.3 F (36.3 C) (Oral)  Ht 5\' 8"  (1.727 m)  Wt 267 lb 8 oz (121.337 kg)  BMI 40.68 kg/m2 General:   Alert,  Well-developed, well-nourished, pleasant and  cooperative in NAD Skin:  Intact without significant lesions or rashes. Eyes:  Sclera clear, no icterus.   Conjunctiva pink. Ears:  Normal auditory acuity. Nose:  No deformity, discharge,  or lesions. Mouth:  No deformity or lesions. Neck:  Supple; no masses or thyromegaly. No significant cervical adenopathy. Lungs:  Clear throughout to auscultation.   No wheezes, crackles, or rhonchi. No acute distress. Heart:  Regular rate and rhythm; no murmurs, clicks, rubs,  or gallops. Abdomen: Obese. Positive bowel sounds soft nontender without appreciable mass or organomegaly.  Pulses:  Normal pulses noted. Extremities:  Without clubbing or edema.  Impression:  GERD symptoms much improved on Dexilant. Constipation well managed on MiraLax and Linzess 290 daily. Continues to have some intermittent pill dysphagia. Appears to have been more with WelChol. She has 3 tablets to take daily. I do not believe the focally dilated segment of thoracic esophagus is causing any symptoms at this point in time. Sounds like her symptoms are more oropharyngeal related to pills than anything else. Barium pill esophagram results noted. I doubt a speech therapy evaluation will have much to add.   Recommendations:   Swallowing precautions reviewed  Get medications in elixir form as feasible  Continue Dexilant 60 mg daily  Continue Linzess and Miralax daily  Patient is encouraged to see Dr. Erik Obey as she desires  Office visit in 6 months    Notice: This dictation was prepared with Dragon dictation along with smaller phrase technology. Any transcriptional errors that result from this process are unintentional and may not be corrected upon review.

## 2014-07-10 NOTE — Progress Notes (Signed)
PATIENT NIC'D FOR OV

## 2014-07-11 ENCOUNTER — Ambulatory Visit (INDEPENDENT_AMBULATORY_CARE_PROVIDER_SITE_OTHER): Payer: Managed Care, Other (non HMO) | Admitting: Emergency Medicine

## 2014-07-11 VITALS — BP 142/74 | HR 71 | Temp 98.3°F | Resp 16 | Ht 67.25 in | Wt 262.0 lb

## 2014-07-11 DIAGNOSIS — M79622 Pain in left upper arm: Secondary | ICD-10-CM

## 2014-07-11 DIAGNOSIS — M25522 Pain in left elbow: Secondary | ICD-10-CM

## 2014-07-11 MED ORDER — TRIAMCINOLONE ACETONIDE 0.1 % EX CREA: 1.0000 "application " | TOPICAL_CREAM | Freq: Two times a day (BID) | CUTANEOUS | Status: DC

## 2014-07-11 NOTE — Patient Instructions (Signed)

## 2014-07-11 NOTE — Progress Notes (Signed)
[facility] [address] [place] [address] [phone]  Date:  07/11/2014   Name:  Robin [name]   DOB:  July 24, 1956   MRN:  [id number]  PCP:  Purvis Kilts, MD    Chief Complaint: Insect Bite   History of Present Illness:  Robin [name] is a 58 y.o. very pleasant female patient who presents with the following:  Trying on a coat in a local store and felt a sharp sting in the left antecubital region.  No rash or swelling.  No systemic symptoms No wheezing or shortness of breath.   Symptoms largely resolved now with no treatment. No improvement with over the counter medications or other home remedies.  Denies other complaint or health concern today.   Patient Active Problem List   Diagnosis Date Noted  . Epigastric pain 07/04/2012  . Abdominal pain, generalized 09/18/2011  . Abnormal small bowel biopsy 09/18/2011  . Lactose intolerance 09/18/2011  . Weight loss, abnormal 06/25/2011  . Anorexia 06/25/2011  . Dysuria 06/25/2011  . Bowel habit changes 06/25/2011  . GERD 01/21/2010  . DYSPHAGIA 01/21/2010    Past Medical History  Diagnosis Date  . GERD (gastroesophageal reflux disease)   . Constipation   . Anxiety disorder   . Hyperlipidemia   . Hypertension   . Chronic neck pain   . Schatzki's ring     non critical  . Hiatal hernia     small  . Sleep apnea     CPAP, Sleep study at [facility]  . Arthritis     Past Surgical History  Procedure Laterality Date  . Knee arthroscopy    . Cardiac catheterization    . Vaginal prolapse repair    . Rectocele repair    . Esophagogastroduodenoscopy  08/23/2008    PIR:JJOA distal esophageal erosions consistent with mild erosive reflux esophagitis, otherwise unremarkable esophagus/ Tiny antral erosions of doubtful clinical significance, otherwise normal stomach, patent pylorus, normal D1 and D2  . Colonoscopy  2006    Dr. [name], hyperplastic polyps  . Colonoscopy  08/06/11     abnormal terminal ileum for 10cm, erosions, geographical ulceration. Bx small bowel mucosa with prominent intramucosal lymphoid aggregates, slightly inflammed  . Esophagogastroduodenoscopy  08/06/11    small hh, noncritical Schatzki's ring s/p 1 F  . Abdominal hysterectomy    . Direct laryngoscopy  03/15/2012    Procedure: DIRECT LARYNGOSCOPY;  Surgeon: [name], MD;  Location: [facility];  Service: ENT;  Laterality: N/A; Dr. Wolicki--:>no foreign body seen. normal esophagus to 40cm  . Esophagoscopy  03/15/2012    Procedure: ESOPHAGOSCOPY;  Surgeon: [name], MD;  Location: [facility] OR;  Service: ENT;  Laterality: N/A;  . 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 Smoking status: Former Smoker -- 0.50 packs/day    Types: Cigarettes  . Smokeless tobacco: Not on file     Comment: 17 yrs ago  . Alcohol Use: No    Family History  Problem Relation Age of Onset  . Kidney cancer Father   . Stomach cancer      aunt  . Breast cancer      grandmother  . Colon cancer Neg Hx     No Known Allergies  Medication list has been reviewed and updated.  Current Outpatient Prescriptions on File Prior to Visit  Medication Sig Dispense Refill  .  aspirin 81 MG tablet Take 81 mg by mouth daily.        Marland Kitchen buPROPion (WELLBUTRIN XL) 300 MG 24 hr tablet Take 300 mg by mouth every morning.       . CELEBREX 200 MG capsule Take 200 mg by mouth daily.       Marland Kitchen co-enzyme Q-10 30 MG capsule Take 30 mg by mouth 3 (three) times daily.      . colesevelam (WELCHOL) 625 MG tablet Take 1,250 mg by mouth 2 (two) times daily with a meal.       . dexlansoprazole (DEXILANT) 60 MG capsule Take 1 capsule (60 mg total) by mouth daily.  90 capsule  3  . escitalopram (LEXAPRO) 20 MG tablet Take 20 mg by mouth daily.       Marland Kitchen estradiol (ESTRACE) 1 MG tablet Take 1 mg by mouth daily.       . Fish Oil OIL by Does not apply route.      . gabapentin (NEURONTIN) 300 MG  capsule Take 300 mg by mouth 2 (two) times daily.       Marland Kitchen ipratropium (ATROVENT) 0.03 % nasal spray Place 2 sprays into the nose 2 (two) times daily.  30 mL  5  . levocetirizine (XYZAL) 5 MG tablet Take 5 mg by mouth daily.       Marland Kitchen levothyroxine (SYNTHROID, LEVOTHROID) 50 MCG tablet Take 50 mcg by mouth daily before breakfast.      . Linaclotide (LINZESS) 290 MCG CAPS capsule Take 290 mcg by mouth daily.      Marland Kitchen losartan-hydrochlorothiazide (HYZAAR) 100-25 MG per tablet Take 1 tablet by mouth daily.      . metformin (FORTAMET) 500 MG (OSM) 24 hr tablet Take 500 mg by mouth daily with breakfast.      . Multiple Vitamin (MULTIVITAMIN) tablet Take 1 tablet by mouth daily.        . Phentermine-Topiramate 7.5-46 MG CP24 Take by mouth daily.       . Probiotic Product (ALIGN) 4 MG CAPS Take 4 mg by mouth daily.  30 capsule  5  . Wheat Dextrin (BENEFIBER) POWD One tablespoon twice a day.  730 g  5  . fluticasone (FLONASE) 50 MCG/ACT nasal spray Place 2 sprays into the nose daily as needed. For congestion      . pantoprazole (PROTONIX) 40 MG tablet TAKE 1 TABLET BY MOUTH TWICE A DAY  60 tablet  11   No current facility-administered medications on file prior to visit.    Review of Systems:  As per HPI, otherwise negative.    Physical Examination: Filed Vitals:   07/11/14 1826  BP: 142/74  Pulse: 71  Temp: 98.3 F (36.8 C)  Resp: 16   Filed Vitals:   07/11/14 1826  Height: 5' 7.25" (1.708 m)  Weight: 262 lb (118.842 kg)   Body mass index is 40.74 kg/(m^2). Ideal Body Weight: Weight in (lb) to have BMI = 25: 160.5   GEN: WDWN, NAD, Non-toxic, Alert & Oriented x 3 HEENT: Atraumatic, Normocephalic.  Ears and Nose: No external deformity. EXTR: No clubbing/cyanosis/edema NEURO: Normal gait. Gross motor and sensory intact left arm PSYCH: Normally interactive. Conversant. Not depressed or anxious appearing.  Calm demeanor.  SKIN:  No erythema, wound, cellulitis, or swelling.  Assessment  and Plan: Possible insect sting. TAC Follow up as needed. Discussed unlikely insect bite from point of manufacture as any insect likely dead.  (thinks it may be in lining.)  Unable to treat what we don't know. Signed,  Ellison Carwin, MD

## 2014-08-08 ENCOUNTER — Ambulatory Visit (INDEPENDENT_AMBULATORY_CARE_PROVIDER_SITE_OTHER): Payer: Managed Care, Other (non HMO) | Admitting: Family Medicine

## 2014-08-08 VITALS — BP 134/82 | HR 95 | Temp 99.0°F | Resp 17 | Ht 67.5 in | Wt 264.0 lb

## 2014-08-08 DIAGNOSIS — R519 Headache, unspecified: Secondary | ICD-10-CM

## 2014-08-08 DIAGNOSIS — F411 Generalized anxiety disorder: Secondary | ICD-10-CM

## 2014-08-08 DIAGNOSIS — R202 Paresthesia of skin: Secondary | ICD-10-CM

## 2014-08-08 DIAGNOSIS — R209 Unspecified disturbances of skin sensation: Secondary | ICD-10-CM

## 2014-08-08 DIAGNOSIS — Z79899 Other long term (current) drug therapy: Secondary | ICD-10-CM

## 2014-08-08 DIAGNOSIS — R0789 Other chest pain: Secondary | ICD-10-CM

## 2014-08-08 DIAGNOSIS — R51 Headache: Secondary | ICD-10-CM

## 2014-08-08 DIAGNOSIS — I10 Essential (primary) hypertension: Secondary | ICD-10-CM

## 2014-08-08 LAB — GLUCOSE, POCT (MANUAL RESULT ENTRY): POC Glucose: 110 mg/dl — AB (ref 70–99)

## 2014-08-08 NOTE — Patient Instructions (Addendum)
No new medications today.  Go home and rest and take it easy this evening. If you have further episodes or worsening you should call 911 and/or go to the emergency room. Everything looks fine from the neurologic standpoint this time, and I do not see a need for sending you for further evaluations.  Do keep your appointment with your doctor regarding follow-up from your pituitary surgery.  Continue the same medications. Continue to work hard on weight loss. Eventually work with your physicians on trying to get on less medications.

## 2014-08-08 NOTE — Progress Notes (Signed)
Subjective: Starting yesterday the patient had a little numbness sensation in the right side of her face. Then she got more of a headache on the right side of the face. She had a little sensation of blurring of vision. She still had some symptoms today and felt a little more social came on in. He does not have any history of CVAs. She does have a history of multiple problems and is on many medications.she has diabetes and hypertension and anxiety and depression and obesity and hyperlipidemia. 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 She has a primary care physician that she sees. Her sugars been running good. She is not employed. She stays at home.she has arthritis and chronic pain. She had a very complicated long medication record and we went through this carefully. She has no hearing problems. No major headache now. No sore throat. She does complain of some substernal soreness. She has not been short of breath except with activity. She denies any GI or GU symptoms. Musculoskeletal is normal.  Objective: Alert and oriented, anxious appearing, obese lady. Her TMs are normal. Hearing intact. Eyes PERRLA. Fundi benign. Sensation normal. Throat clear. Neck supple without nodes or thyromegaly. No carotid bruits. Chest is clear to auscultation. Heart regular without murmurs gallops or arrhythmias. Abdomen soft without mass or tenderness. Cranial nerves II-12 grossly intact. Rapid alternating movements normal. Gait normal. Slightly clumsy with heel-to-toe walking. Her Romberg is negative. Sensory grossly normal. Motor symmetrical. Patient is fully alert and oriented.  Patient was seen stat  EKG normal  Glucose normal  Assessment: Paresthesias right face Right headache Nonspecific chest pain Morbid obesity Polypharmacy Anxiety Diabetes Hypertension Osteoarthritis   Plan: After observation and close  exam and evaluation I do not see indication doing anything else at this time. Reassurance. Go to emergency room if worse. See discharge instructions

## 2014-08-14 ENCOUNTER — Ambulatory Visit (HOSPITAL_COMMUNITY)
Admission: RE | Admit: 2014-08-14 | Discharge: 2014-08-14 | Disposition: A | Discharge status: Home / Self Care | Payer: Managed Care, Other (non HMO) | Source: Ambulatory Visit | Attending: Family Medicine | Admitting: Family Medicine

## 2014-08-14 ENCOUNTER — Other Ambulatory Visit (HOSPITAL_COMMUNITY): Payer: Self-pay | Admitting: Family Medicine

## 2014-08-14 ENCOUNTER — Encounter (INDEPENDENT_AMBULATORY_CARE_PROVIDER_SITE_OTHER): Payer: Self-pay

## 2014-08-14 DIAGNOSIS — M545 Low back pain: Secondary | ICD-10-CM

## 2014-08-14 DIAGNOSIS — W19XXXA Unspecified fall, initial encounter: Secondary | ICD-10-CM | POA: Insufficient documentation

## 2014-08-27 ENCOUNTER — Ambulatory Visit: Payer: Managed Care, Other (non HMO) | Admitting: *Deleted

## 2014-09-24 ENCOUNTER — Telehealth: Payer: Self-pay | Admitting: Internal Medicine

## 2014-09-24 NOTE — Telephone Encounter (Signed)
PATIENT CALLED INQUIRING IF WE HAD ANY SAMPLES OF Rolan Lipa

## 2014-09-24 NOTE — Telephone Encounter (Signed)
This was already taken care of. See other phone note.

## 2014-09-24 NOTE — Telephone Encounter (Signed)
Gave #3 boxes of linzess 290mcg. 

## 2014-09-24 NOTE — Telephone Encounter (Signed)
PATIENT CALLED INQUIRING ABOUT LINZESS SAMPLES

## 2014-09-24 NOTE — Telephone Encounter (Signed)
PATIENT CALLED WANTING SAMPLES OF Rolan Lipa

## 2014-10-09 ENCOUNTER — Telehealth: Payer: Self-pay

## 2014-10-09 NOTE — Telephone Encounter (Signed)
Pt called requesting samples of linzess 228mcg.#3 bottles of linzess 281mcg are at the front for the pt to pick up.

## 2014-10-09 NOTE — Telephone Encounter (Signed)
Noted  

## 2014-10-19 ENCOUNTER — Other Ambulatory Visit: Payer: Self-pay | Admitting: Otolaryngology

## 2014-10-19 DIAGNOSIS — R1310 Dysphagia, unspecified: Secondary | ICD-10-CM

## 2014-10-22 ENCOUNTER — Ambulatory Visit
Admission: RE | Admit: 2014-10-22 | Discharge: 2014-10-22 | Disposition: A | Discharge status: Home / Self Care | Payer: Managed Care, Other (non HMO) | Source: Ambulatory Visit | Attending: Otolaryngology | Admitting: Otolaryngology

## 2014-10-22 DIAGNOSIS — R1310 Dysphagia, unspecified: Secondary | ICD-10-CM

## 2014-10-30 ENCOUNTER — Telehealth: Payer: Self-pay | Admitting: Internal Medicine

## 2014-10-30 NOTE — Telephone Encounter (Signed)
Pt was calling to see if she could get some Linzess samples. Please call [phone]

## 2014-10-31 MED ORDER — LINACLOTIDE 290 MCG PO CAPS: 290.0000 ug | ORAL_CAPSULE | Freq: Every day | ORAL | Status: DC

## 2014-10-31 NOTE — Telephone Encounter (Signed)
rx sent

## 2014-10-31 NOTE — Addendum Note (Signed)
Addended by: Mahala Menghini on: 10/31/2014 10:41 AM   Modules accepted: Orders

## 2014-10-31 NOTE — Telephone Encounter (Signed)
#  2 boxes of linzess 254mcg given to pt and a copay card. Pt needs rx sent to the pharmacy.

## 2014-11-05 ENCOUNTER — Telehealth: Payer: Self-pay | Admitting: Internal Medicine

## 2014-11-05 NOTE — Telephone Encounter (Signed)
WANTS LINZESS CALLED INTO PHARMACY  SAMPLES WORKED FOR HER   USES [facility] IN [place] ON [address]

## 2014-11-05 NOTE — Telephone Encounter (Signed)
I did RX on 10/31/14 for one years worth.

## 2014-11-05 NOTE — Telephone Encounter (Signed)
Tried to call pt- LM with information.

## 2014-11-05 NOTE — Telephone Encounter (Signed)
Routing to the refill box. 

## 2014-11-05 NOTE — Telephone Encounter (Signed)
Spoke with the pt and she is aware, she had not checked with the pharmacy yet.

## 2014-12-27 ENCOUNTER — Encounter: Payer: Self-pay | Admitting: Internal Medicine

## 2015-01-22 ENCOUNTER — Ambulatory Visit: Payer: Managed Care, Other (non HMO) | Admitting: Internal Medicine

## 2015-02-17 ENCOUNTER — Ambulatory Visit: Payer: 59

## 2015-02-17 ENCOUNTER — Encounter (HOSPITAL_COMMUNITY): Payer: Self-pay | Admitting: Emergency Medicine

## 2015-02-17 ENCOUNTER — Emergency Department (HOSPITAL_COMMUNITY)
Admission: EM | Admit: 2015-02-17 | Discharge: 2015-02-17 | Disposition: A | Discharge status: Home / Self Care | Payer: 59 | Source: Home / Self Care | Attending: Emergency Medicine | Admitting: Emergency Medicine

## 2015-02-17 DIAGNOSIS — G44229 Chronic tension-type headache, not intractable: Secondary | ICD-10-CM | POA: Diagnosis not present

## 2015-02-17 MED ORDER — ONDANSETRON HCL 4 MG/2ML IJ SOLN: 4.0000 mg | Freq: Once | INTRAMUSCULAR | Status: DC

## 2015-02-17 MED ORDER — KETOROLAC TROMETHAMINE 60 MG/2ML IM SOLN
60.0000 mg | Freq: Once | INTRAMUSCULAR | Status: AC
  Administered 2015-02-17: 60 mg via INTRAMUSCULAR

## 2015-02-17 MED ORDER — ONDANSETRON 4 MG PO TBDP
ORAL_TABLET | ORAL | Status: AC
  Filled 2015-02-17: qty 1

## 2015-02-17 MED ORDER — AMITRIPTYLINE HCL 25 MG PO TABS: 25.0000 mg | ORAL_TABLET | Freq: Every day | ORAL | Status: DC

## 2015-02-17 MED ORDER — DEXAMETHASONE SODIUM PHOSPHATE 10 MG/ML IJ SOLN
10.0000 mg | Freq: Once | INTRAMUSCULAR | Status: AC
  Administered 2015-02-17: 10 mg via INTRAMUSCULAR

## 2015-02-17 MED ORDER — DEXAMETHASONE SODIUM PHOSPHATE 10 MG/ML IJ SOLN
INTRAMUSCULAR | Status: AC
  Filled 2015-02-17: qty 1

## 2015-02-17 MED ORDER — ONDANSETRON 4 MG PO TBDP
4.0000 mg | ORAL_TABLET | Freq: Once | ORAL | Status: AC
  Administered 2015-02-17: 4 mg via ORAL

## 2015-02-17 MED ORDER — KETOROLAC TROMETHAMINE 60 MG/2ML IM SOLN
INTRAMUSCULAR | Status: AC
  Filled 2015-02-17: qty 2

## 2015-02-17 NOTE — Discharge Instructions (Signed)
We gave you some medicines today to break the headache. Take amitriptyline at bedtime for the next 2 weeks. If you headache worsens or you develop fevers or weakness on one side of your body, please go to the emergency room.  Tension Headache A tension headache is pain, pressure, or aching felt over the front and sides of the head. Tension headaches often come after stress, feeling worried (anxiety), or feeling sad or down for a while (depressed). HOME CARE  Only take medicine as told by your doctor.  Lie down in a dark, quiet room when you have a headache.  Keep a journal to find out if certain things bring on headaches. For example, write down:  What you eat and drink.  How much sleep you get.  Any change to your diet or medicines.  Relax by getting a massage or doing other relaxing activities.  Put ice or heat packs on the head and neck area as told by your doctor.  Lessen stress.  Sit up straight. Do not tighten (tense) your muscles.  Quit smoking if you smoke.  Lessen how much alcohol you drink.  Lessen how much caffeine you drink, or stop drinking caffeine.  Eat and exercise regularly.  Get enough sleep.  Avoid using too much pain medicine. GET HELP RIGHT AWAY IF:   Your headache becomes really bad.  You have a fever.  You have a stiff neck.  You have trouble seeing.  Your muscles are weak, or you lose muscle control.  You lose your balance or have trouble walking.  You feel like you will pass out (faint), or you pass out.  You have really bad symptoms that are different than your first symptoms.  You have problems with the medicines given to you by your doctor.  Your medicines do not work.  Your headache feels different than the other headaches.  You feel sick to your stomach (nauseous) or throw up (vomit). MAKE SURE YOU:   Understand these instructions.  Will watch your condition.  Will get help right away if you are not doing well or get  worse. Document Released: 12/16/2009 Document Revised: 12/14/2011 Document Reviewed: 09/11/2011 [facility] Patient Information 2015 [place], [place]. This information is not intended to replace advice given to you by your health care provider. Make sure you discuss any questions you have with your health care provider.

## 2015-02-17 NOTE — ED Notes (Signed)
Patient c/o persistent headache and nausea x 1 month. She has some sensitivity to light. Patient reports she does not know what is causing it. Patient is in NAD.

## 2015-02-17 NOTE — ED Provider Notes (Addendum)
CSN: [id number]     Arrival date & time 02/17/15  0944 History   First MD Initiated Contact with Patient 02/17/15 1114     Chief Complaint  Patient presents with  . Headache  . Nausea   (Consider location/radiation/quality/duration/timing/severity/associated sxs/prior Treatment) HPI She is a 59 year old woman here for evaluation of headache. She states she has had this headache for about one month. She describes it as a heavy feeling throughout her head. It is associated with some mild sensitivity to light as well as nausea. She denies any vomiting. The headache does not seem to be better or worse at any time of day. It may be a little worse with being upright. She has tried allergy medication without improvement. She denies any nasal congestion or rhinorrhea. She has been taking Tylenol with temporary improvement, but not complete resolution. She does have some numbness in the left hand, but this is a chronic issue due to an old injury. Otherwise, no numbness, tingling, weakness. No difficulty speaking. She has had headaches in the past, but they typically resolve within a few hours.  Past Medical History  Diagnosis Date  . GERD (gastroesophageal reflux disease)   . Constipation   . Anxiety disorder   . Hyperlipidemia   . Hypertension   . Chronic neck pain   . Schatzki's ring     non critical  . Hiatal hernia     small  . Sleep apnea     CPAP, Sleep study at [facility]  . Arthritis    Past Surgical History  Procedure Laterality Date  . Knee arthroscopy    . Cardiac catheterization    . Vaginal prolapse repair    . Rectocele repair    . Esophagogastroduodenoscopy  08/23/2008    OLM:BEML distal esophageal erosions consistent with mild erosive reflux esophagitis, otherwise unremarkable esophagus/ Tiny antral erosions of doubtful clinical significance, otherwise normal stomach, patent pylorus, normal D1 and D2  . Colonoscopy  2006    Dr. [name], hyperplastic polyps   . Colonoscopy  08/06/11    abnormal terminal ileum for 10cm, erosions, geographical ulceration. Bx small bowel mucosa with prominent intramucosal lymphoid aggregates, slightly inflammed  . Esophagogastroduodenoscopy  08/06/11    small hh, noncritical Schatzki's ring s/p 77 F  . Abdominal hysterectomy    . Direct laryngoscopy  03/15/2012    Procedure: DIRECT LARYNGOSCOPY;  Surgeon: [name], MD;  Location: [facility];  Service: ENT;  Laterality: N/A; Dr. Wolicki--:>no foreign body seen. normal esophagus to 40cm  . Esophagoscopy  03/15/2012    Procedure: ESOPHAGOSCOPY;  Surgeon: [name], MD;  Location: [facility] OR;  Service: ENT;  Laterality: N/A;  . Pituitary surgery  06/2012    benign tumor, surgeon at [facility]   Family History  Problem Relation Age of Onset  . Kidney cancer Father   . Stomach cancer      aunt  . Breast cancer      grandmother  . Colon cancer Neg Hx    History  Substance Use Topics  . Smoking status: Former Smoker -- 0.50 packs/day    Types: Cigarettes  . Smokeless tobacco: Not on file     Comment: 17 yrs ago  . Alcohol Use: No   OB History    No data available     Review of Systems As in history of present illness Allergies  Review of patient's allergies indicates no known allergies.  Home Medications   Prior to Admission medications  Medication Sig Start Date End Date Taking? Authorizing Provider  amitriptyline (ELAVIL) 25 MG tablet Take 1 tablet (25 mg total) by mouth at bedtime. For 2 weeks. 02/17/15   Melony Overly, MD  aspirin 81 MG tablet Take 81 mg by mouth daily.      Historical Provider, MD  buPROPion (WELLBUTRIN XL) 300 MG 24 hr tablet Take 300 mg by mouth every morning.  09/08/11   Historical Provider, MD  CELEBREX 200 MG capsule Take 200 mg by mouth daily.  11/25/12   Historical Provider, MD  co-enzyme Q-10 30 MG capsule Take 30 mg by mouth 3 (three) times daily.    Historical Provider, MD  colesevelam (WELCHOL) 625 MG tablet Take 1,250 mg by  mouth 2 (two) times daily with a meal.     Historical Provider, MD  dexlansoprazole (DEXILANT) 60 MG capsule Take 1 capsule (60 mg total) by mouth daily. 07/02/14   Mahala Menghini, PA-C  estradiol (ESTRACE) 1 MG tablet Take 1 mg by mouth daily.  09/10/11   Historical Provider, MD  Fish Oil OIL by Does not apply route.    Historical Provider, MD  fluticasone (FLONASE) 50 MCG/ACT nasal spray Place 2 sprays into the nose daily as needed. For congestion 11/11/11 08/08/14  Elige Radon, MD  gabapentin (NEURONTIN) 300 MG capsule Take 300 mg by mouth 2 (two) times daily.  02/11/12   Historical Provider, MD  ipratropium (ATROVENT) 0.03 % nasal spray Place 2 sprays into the nose 2 (two) times daily. 01/20/13   Wardell Honour, MD  levocetirizine (XYZAL) 5 MG tablet Take 5 mg by mouth daily.     Historical Provider, MD  levothyroxine (SYNTHROID, LEVOTHROID) 50 MCG tablet Take 50 mcg by mouth daily before breakfast.    Historical Provider, MD  Linaclotide (LINZESS) 290 MCG CAPS capsule Take 1 capsule (290 mcg total) by mouth daily. 10/31/14   Mahala Menghini, PA-C  losartan-hydrochlorothiazide (HYZAAR) 100-25 MG per tablet Take 1 tablet by mouth daily.    Historical Provider, MD  metformin (FORTAMET) 500 MG (OSM) 24 hr tablet Take 500 mg by mouth daily with breakfast.    Historical Provider, MD  Multiple Vitamin (MULTIVITAMIN) tablet Take 1 tablet by mouth daily.      Historical Provider, MD  PRESCRIPTION MEDICATION 17.5 mg daily.    Historical Provider, MD  traMADol (ULTRAM) 50 MG tablet Take by mouth every 6 (six) hours as needed.    Historical Provider, MD  triamcinolone cream (KENALOG) 0.1 % Apply 1 application topically 2 (two) times daily. 07/11/14   Roselee Culver, MD   BP 160/80 mmHg  Pulse 69  Temp(Src) 97.8 F (36.6 C) (Oral)  Resp 18  SpO2 97% Physical Exam  Constitutional: She is oriented to person, place, and time. She appears well-developed and well-nourished. No distress.  Eyes: EOM are  normal. Pupils are equal, round, and reactive to light.  Fundoscopic exam:      The right eye shows no papilledema.       The left eye shows no papilledema.  Neck: Neck supple.  Cardiovascular: Normal rate.   Pulmonary/Chest: Effort normal.  Neurological: She is alert and oriented to person, place, and time. No cranial nerve deficit. She exhibits normal muscle tone. Coordination normal.  Bilateral 5 strength in all extremities. Romberg normal.    ED Course  Procedures (including critical care time) Labs Review Labs Reviewed - No data to display  Imaging Review No results found.  MDM   1. Chronic tension-type headache, not intractable    Her neurologic exam is normal. History is most consistent with a tension type headache, although there are some migrainous components. We'll treat here with Toradol 60 mg IM, Decadron 10 mg IM, Zofran 4 mg ODT. Discharge home with 2 weeks of Elavil at bedtime. Return precautions reviewed.    Melony Overly, MD 02/17/15 [place], MD 02/17/15 1240

## 2015-02-17 NOTE — ED Notes (Signed)
Patient held to make sure there was no reaction to medication. Patient tolerated well.

## 2015-02-20 ENCOUNTER — Other Ambulatory Visit: Payer: Self-pay | Admitting: Sports Medicine

## 2015-02-20 DIAGNOSIS — M5440 Lumbago with sciatica, unspecified side: Secondary | ICD-10-CM

## 2015-02-26 ENCOUNTER — Ambulatory Visit: Payer: Self-pay | Admitting: Internal Medicine

## 2015-02-26 ENCOUNTER — Telehealth: Payer: Self-pay | Admitting: Internal Medicine

## 2015-02-26 NOTE — Telephone Encounter (Signed)
Pt was a no show

## 2015-03-04 ENCOUNTER — Emergency Department (HOSPITAL_COMMUNITY): Payer: 59

## 2015-03-04 ENCOUNTER — Encounter (HOSPITAL_COMMUNITY): Payer: Self-pay | Admitting: Emergency Medicine

## 2015-03-04 ENCOUNTER — Observation Stay (HOSPITAL_COMMUNITY): Payer: 59

## 2015-03-04 ENCOUNTER — Emergency Department (INDEPENDENT_AMBULATORY_CARE_PROVIDER_SITE_OTHER)
Admission: EM | Admit: 2015-03-04 | Discharge: 2015-03-04 | Disposition: A | Discharge status: Nursing Home (Includes ICF) | Payer: 59 | Source: Home / Self Care | Attending: Family Medicine | Admitting: Family Medicine

## 2015-03-04 ENCOUNTER — Observation Stay (HOSPITAL_COMMUNITY)
Admission: EM | Admit: 2015-03-04 | Discharge: 2015-03-05 | Disposition: A | Discharge status: Home / Self Care | Payer: 59 | Attending: Internal Medicine | Admitting: Internal Medicine

## 2015-03-04 DIAGNOSIS — E785 Hyperlipidemia, unspecified: Secondary | ICD-10-CM | POA: Diagnosis not present

## 2015-03-04 DIAGNOSIS — Z9981 Dependence on supplemental oxygen: Secondary | ICD-10-CM | POA: Diagnosis not present

## 2015-03-04 DIAGNOSIS — G8929 Other chronic pain: Secondary | ICD-10-CM | POA: Insufficient documentation

## 2015-03-04 DIAGNOSIS — R079 Chest pain, unspecified: Principal | ICD-10-CM | POA: Insufficient documentation

## 2015-03-04 DIAGNOSIS — Z79899 Other long term (current) drug therapy: Secondary | ICD-10-CM | POA: Diagnosis not present

## 2015-03-04 DIAGNOSIS — E039 Hypothyroidism, unspecified: Secondary | ICD-10-CM

## 2015-03-04 DIAGNOSIS — R51 Headache: Secondary | ICD-10-CM | POA: Insufficient documentation

## 2015-03-04 DIAGNOSIS — T463X5A Adverse effect of coronary vasodilators, initial encounter: Secondary | ICD-10-CM | POA: Insufficient documentation

## 2015-03-04 DIAGNOSIS — F419 Anxiety disorder, unspecified: Secondary | ICD-10-CM | POA: Diagnosis not present

## 2015-03-04 DIAGNOSIS — K5900 Constipation, unspecified: Secondary | ICD-10-CM | POA: Insufficient documentation

## 2015-03-04 DIAGNOSIS — Z87891 Personal history of nicotine dependence: Secondary | ICD-10-CM | POA: Diagnosis not present

## 2015-03-04 DIAGNOSIS — K219 Gastro-esophageal reflux disease without esophagitis: Secondary | ICD-10-CM | POA: Insufficient documentation

## 2015-03-04 DIAGNOSIS — G4733 Obstructive sleep apnea (adult) (pediatric): Secondary | ICD-10-CM

## 2015-03-04 DIAGNOSIS — Q394 Esophageal web: Secondary | ICD-10-CM | POA: Insufficient documentation

## 2015-03-04 DIAGNOSIS — Z7982 Long term (current) use of aspirin: Secondary | ICD-10-CM | POA: Insufficient documentation

## 2015-03-04 DIAGNOSIS — G4730 Sleep apnea, unspecified: Secondary | ICD-10-CM | POA: Diagnosis not present

## 2015-03-04 LAB — BASIC METABOLIC PANEL
ANION GAP: 6 (ref 5–15)
BUN: 9 mg/dL (ref 6–20)
CO2: 28 mmol/L (ref 22–32)
CREATININE: 0.95 mg/dL (ref 0.44–1.00)
Calcium: 8.9 mg/dL (ref 8.9–10.3)
Chloride: 105 mmol/L (ref 101–111)
GFR calc Af Amer: >60 mL/min (ref >60)
GFR calc non Af Amer: >60 mL/min (ref >60)
GLUCOSE: 105 mg/dL — AB (ref 65–99)
POTASSIUM: 3.8 mmol/L (ref 3.5–5.1)
SODIUM: 139 mmol/L (ref 135–145)

## 2015-03-04 LAB — CBC
HEMATOCRIT: 37.1 % (ref 36.0–46.0)
HEMOGLOBIN: 11.8 g/dL — AB (ref 12.0–15.0)
MCH: 28.6 pg (ref 26.0–34.0)
MCHC: 31.8 g/dL (ref 30.0–36.0)
MCV: 90 fL (ref 78.0–100.0)
Platelets: 353 10*3/uL (ref 150–400)
RBC: 4.12 MIL/uL (ref 3.87–5.11)
RDW: 12.7 % (ref 11.5–15.5)
WBC: 7.7 10*3/uL (ref 4.0–10.5)

## 2015-03-04 LAB — LIPID PANEL
Cholesterol: 220 mg/dL — ABNORMAL HIGH (ref 0–200)
HDL: 82 mg/dL (ref >40)
LDL CALC: 122 mg/dL — AB (ref 0–99)
TRIGLYCERIDES: 82 mg/dL (ref <150)
Total CHOL/HDL Ratio: 2.7 RATIO
VLDL: 16 mg/dL (ref 0–40)

## 2015-03-04 LAB — FERRITIN: FERRITIN: 34 ng/mL (ref 11–307)

## 2015-03-04 LAB — TSH: TSH: 2.793 u[IU]/mL (ref 0.350–4.500)

## 2015-03-04 LAB — MRSA PCR SCREENING: MRSA by PCR: NEGATIVE

## 2015-03-04 LAB — TROPONIN I: Troponin I: <0.03 ng/mL (ref <0.031)

## 2015-03-04 LAB — IRON AND TIBC
Iron: 31 ug/dL (ref 28–170)
Saturation Ratios: 9 % — ABNORMAL LOW (ref 10.4–31.8)
TIBC: 340 ug/dL (ref 250–450)
UIBC: 309 ug/dL

## 2015-03-04 LAB — I-STAT TROPONIN, ED: Troponin i, poc: 0 ng/mL (ref 0.00–0.08)

## 2015-03-04 MED ORDER — PANTOPRAZOLE SODIUM 40 MG PO TBEC: 40.0000 mg | DELAYED_RELEASE_TABLET | Freq: Every day | ORAL | Status: DC

## 2015-03-04 MED ORDER — IOHEXOL 350 MG/ML SOLN
100.0000 mL | Freq: Once | INTRAVENOUS | Status: AC | PRN
  Administered 2015-03-04: 100 mL via INTRAVENOUS

## 2015-03-04 MED ORDER — SODIUM CHLORIDE 0.9 % IV SOLN
Freq: Once | INTRAVENOUS | Status: AC
  Administered 2015-03-04: 15:00:00 via INTRAVENOUS

## 2015-03-04 MED ORDER — ASPIRIN 81 MG PO CHEW
324.0000 mg | CHEWABLE_TABLET | Freq: Once | ORAL | Status: AC
  Administered 2015-03-04: 324 mg via ORAL

## 2015-03-04 MED ORDER — LEVOCETIRIZINE DIHYDROCHLORIDE 5 MG PO TABS: 5.0000 mg | ORAL_TABLET | Freq: Every day | ORAL | Status: DC

## 2015-03-04 MED ORDER — LOSARTAN POTASSIUM 50 MG PO TABS
100.0000 mg | ORAL_TABLET | Freq: Every day | ORAL | Status: DC
  Administered 2015-03-05: 100 mg via ORAL
  Filled 2015-03-04: qty 2

## 2015-03-04 MED ORDER — ALPRAZOLAM 0.25 MG PO TABS: 0.2500 mg | ORAL_TABLET | Freq: Two times a day (BID) | ORAL | Status: DC | PRN

## 2015-03-04 MED ORDER — ASPIRIN 81 MG PO CHEW
CHEWABLE_TABLET | ORAL | Status: AC
  Filled 2015-03-04: qty 4

## 2015-03-04 MED ORDER — LINACLOTIDE 290 MCG PO CAPS
290.0000 ug | ORAL_CAPSULE | Freq: Every day | ORAL | Status: DC
  Administered 2015-03-05: 290 ug via ORAL
  Filled 2015-03-04: qty 1

## 2015-03-04 MED ORDER — ACETAMINOPHEN 325 MG PO TABS: 650.0000 mg | ORAL_TABLET | ORAL | Status: DC | PRN

## 2015-03-04 MED ORDER — LOSARTAN POTASSIUM-HCTZ 100-25 MG PO TABS: 1.0000 | ORAL_TABLET | Freq: Every day | ORAL | Status: DC

## 2015-03-04 MED ORDER — BUPROPION HCL ER (XL) 300 MG PO TB24
300.0000 mg | ORAL_TABLET | ORAL | Status: DC
  Filled 2015-03-04 (×2): qty 1

## 2015-03-04 MED ORDER — HYDROCHLOROTHIAZIDE 25 MG PO TABS
25.0000 mg | ORAL_TABLET | Freq: Every day | ORAL | Status: DC
  Administered 2015-03-05: 25 mg via ORAL
  Filled 2015-03-04: qty 1

## 2015-03-04 MED ORDER — MORPHINE SULFATE 2 MG/ML IJ SOLN: 2.0000 mg | INTRAMUSCULAR | Status: DC | PRN

## 2015-03-04 MED ORDER — COENZYME Q10 30 MG PO CAPS: 30.0000 mg | ORAL_CAPSULE | Freq: Three times a day (TID) | ORAL | Status: DC

## 2015-03-04 MED ORDER — CELECOXIB 200 MG PO CAPS
200.0000 mg | ORAL_CAPSULE | Freq: Every day | ORAL | Status: DC
  Administered 2015-03-05: 200 mg via ORAL
  Filled 2015-03-04: qty 1

## 2015-03-04 MED ORDER — GI COCKTAIL ~~LOC~~
30.0000 mL | Freq: Four times a day (QID) | ORAL | Status: DC | PRN
  Administered 2015-03-05: 30 mL via ORAL
  Filled 2015-03-04: qty 30

## 2015-03-04 MED ORDER — AMITRIPTYLINE HCL 25 MG PO TABS
25.0000 mg | ORAL_TABLET | Freq: Every day | ORAL | Status: DC
  Administered 2015-03-04: 25 mg via ORAL
  Filled 2015-03-04: qty 1

## 2015-03-04 MED ORDER — ASPIRIN EC 81 MG PO TBEC
81.0000 mg | DELAYED_RELEASE_TABLET | Freq: Every day | ORAL | Status: DC
Start: 2015-03-04 — End: 2015-03-05
  Administered 2015-03-05: 81 mg via ORAL
  Filled 2015-03-04 (×2): qty 1

## 2015-03-04 MED ORDER — ZOLPIDEM TARTRATE 5 MG PO TABS: 5.0000 mg | ORAL_TABLET | Freq: Every evening | ORAL | Status: DC | PRN

## 2015-03-04 MED ORDER — NITROGLYCERIN 2 % TD OINT: 0.5000 [in_us] | TOPICAL_OINTMENT | Freq: Four times a day (QID) | TRANSDERMAL | Status: DC

## 2015-03-04 MED ORDER — MORPHINE SULFATE 4 MG/ML IJ SOLN
4.0000 mg | Freq: Once | INTRAMUSCULAR | Status: DC
  Filled 2015-03-04: qty 1

## 2015-03-04 MED ORDER — GABAPENTIN 300 MG PO CAPS
300.0000 mg | ORAL_CAPSULE | Freq: Two times a day (BID) | ORAL | Status: DC
  Administered 2015-03-04 – 2015-03-05 (×2): 300 mg via ORAL
  Filled 2015-03-04 (×2): qty 1

## 2015-03-04 MED ORDER — IPRATROPIUM BROMIDE 0.03 % NA SOLN
2.0000 | Freq: Two times a day (BID) | NASAL | Status: DC
Start: 2015-03-04 — End: 2015-03-04

## 2015-03-04 MED ORDER — TRAMADOL HCL 50 MG PO TABS
50.0000 mg | ORAL_TABLET | Freq: Four times a day (QID) | ORAL | Status: DC | PRN
  Administered 2015-03-05: 50 mg via ORAL
  Filled 2015-03-04: qty 1

## 2015-03-04 MED ORDER — GI COCKTAIL ~~LOC~~
30.0000 mL | Freq: Once | ORAL | Status: AC
  Administered 2015-03-04: 30 mL via ORAL
  Filled 2015-03-04: qty 30

## 2015-03-04 MED ORDER — SODIUM CHLORIDE 0.9 % IV BOLUS (SEPSIS)
500.0000 mL | Freq: Once | INTRAVENOUS | Status: AC
  Administered 2015-03-04: 500 mL via INTRAVENOUS

## 2015-03-04 MED ORDER — HEPARIN SODIUM (PORCINE) 5000 UNIT/ML IJ SOLN
5000.0000 [IU] | Freq: Three times a day (TID) | INTRAMUSCULAR | Status: DC
  Administered 2015-03-04 – 2015-03-05 (×2): 5000 [IU] via SUBCUTANEOUS
  Filled 2015-03-04 (×2): qty 1

## 2015-03-04 MED ORDER — ONDANSETRON HCL 4 MG/2ML IJ SOLN: 4.0000 mg | Freq: Four times a day (QID) | INTRAMUSCULAR | Status: DC | PRN

## 2015-03-04 MED ORDER — NITROGLYCERIN 0.4 MG SL SUBL
SUBLINGUAL_TABLET | SUBLINGUAL | Status: AC
  Filled 2015-03-04: qty 1

## 2015-03-04 MED ORDER — LEVOTHYROXINE SODIUM 50 MCG PO TABS
50.0000 ug | ORAL_TABLET | Freq: Every day | ORAL | Status: DC
  Administered 2015-03-05: 50 ug via ORAL
  Filled 2015-03-04: qty 1

## 2015-03-04 MED ORDER — COLESEVELAM HCL 625 MG PO TABS
1250.0000 mg | ORAL_TABLET | Freq: Two times a day (BID) | ORAL | Status: DC
  Administered 2015-03-05: 1250 mg via ORAL
  Filled 2015-03-04 (×3): qty 2

## 2015-03-04 MED ORDER — GI COCKTAIL ~~LOC~~
30.0000 mL | Freq: Once | ORAL | Status: DC
Start: 2015-03-04 — End: 2015-03-04

## 2015-03-04 MED ORDER — METFORMIN HCL ER 500 MG PO TB24
500.0000 mg | ORAL_TABLET | Freq: Every day | ORAL | Status: DC
  Administered 2015-03-05: 500 mg via ORAL
  Filled 2015-03-04 (×2): qty 1

## 2015-03-04 MED ORDER — TRIAMCINOLONE ACETONIDE 0.1 % EX CREA
1.0000 "application " | TOPICAL_CREAM | Freq: Two times a day (BID) | CUTANEOUS | Status: DC
  Filled 2015-03-04: qty 15

## 2015-03-04 MED ORDER — MORPHINE SULFATE 4 MG/ML IJ SOLN
4.0000 mg | Freq: Once | INTRAMUSCULAR | Status: AC
  Administered 2015-03-04: 4 mg via INTRAVENOUS
  Filled 2015-03-04: qty 1

## 2015-03-04 MED ORDER — NITROGLYCERIN 0.4 MG SL SUBL
0.4000 mg | SUBLINGUAL_TABLET | SUBLINGUAL | Status: DC | PRN
  Administered 2015-03-04: 0.4 mg via SUBLINGUAL

## 2015-03-04 MED ORDER — ESTRADIOL 1 MG PO TABS: 1.0000 mg | ORAL_TABLET | Freq: Every day | ORAL | Status: DC

## 2015-03-04 NOTE — Progress Notes (Signed)
PHARMACIST - PHYSICIAN ORDER COMMUNICATION  CONCERNING: P&T Medication Policy on Herbal Medications  DESCRIPTION:  This patient's order for:  Co-enzyme Q10 has been noted.  This product(s) is classified as an "herbal" or natural product. Due to a lack of definitive safety studies or FDA approval, nonstandard manufacturing practices, plus the potential risk of unknown drug-drug interactions while on inpatient medications, the Pharmacy and Therapeutics Committee does not permit the use of "herbal" or natural products of this type within [place].   ACTION TAKEN: The pharmacy department is unable to verify this order at this time and your patient has been informed of this safety policy. Please reevaluate patient's clinical condition at discharge and address if the herbal or natural product(s) should be resumed at that time.   

## 2015-03-04 NOTE — ED Notes (Signed)
Pt being transported to [facility].
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
## 2015-03-04 NOTE — ED Notes (Signed)
Notified [facility] 

## 2015-03-04 NOTE — Progress Notes (Signed)
RT placed pt on auto titrate CPAP via nasal mask on room air. Pt states she is comfortable and appears to be tolerating CPAP well at this time. RT will continue to monitor as needed.
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
## 2015-03-04 NOTE — ED Notes (Signed)
Admitting at bedside 
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
## 2015-03-04 NOTE — ED Notes (Signed)
Pt called CT scan to determine when pt will be transported to CT. Informed patient is next to go.

## 2015-03-04 NOTE — ED Notes (Signed)
Patient in xray 

## 2015-03-04 NOTE — ED Notes (Signed)
Pt placed onto monitor upon arrival to room. Pt monitored by blood pressure, pulse ox, and 5 lead. Pt was able to ambulate to the restroom with no assistance needed.

## 2015-03-04 NOTE — ED Notes (Signed)
Assisted patient to rest room and returned. Collected urine sample, placed bedside.

## 2015-03-04 NOTE — ED Notes (Addendum)
[facility] - At Urgent Care with c/o of chest pain.  Patient stated chest pain is central and described as a "great heaviness on the central portion of her chest starting today 2 hours prior to arrival at urgent care following a verbal altercation with family".  For the past 2 days has had a fluttering in her chest.  Feels weak and fatigued.  Dr. Alvester Chou is cardiologist.  Given 324mg  Aspiring, 0.4 Nitro and NS infusion.

## 2015-03-05 ENCOUNTER — Observation Stay (HOSPITAL_COMMUNITY): Payer: 59

## 2015-03-05 ENCOUNTER — Other Ambulatory Visit: Payer: Self-pay | Admitting: Internal Medicine

## 2015-03-05 ENCOUNTER — Encounter: Payer: Self-pay | Admitting: Internal Medicine

## 2015-03-05 DIAGNOSIS — R002 Palpitations: Secondary | ICD-10-CM

## 2015-03-05 DIAGNOSIS — R0789 Other chest pain: Secondary | ICD-10-CM

## 2015-03-05 DIAGNOSIS — R072 Precordial pain: Secondary | ICD-10-CM

## 2015-03-05 DIAGNOSIS — K219 Gastro-esophageal reflux disease without esophagitis: Secondary | ICD-10-CM | POA: Diagnosis not present

## 2015-03-05 LAB — TROPONIN I: Troponin I: <0.03 ng/mL (ref <0.031)

## 2015-03-05 MED ORDER — FERROUS SULFATE 325 (65 FE) MG PO TABS: 325.0000 mg | ORAL_TABLET | Freq: Two times a day (BID) | ORAL | Status: DC

## 2015-03-05 MED ORDER — CHLORHEXIDINE GLUCONATE 0.12 % MT SOLN
15.0000 mL | Freq: Two times a day (BID) | OROMUCOSAL | Status: DC
  Administered 2015-03-05: 15 mL via OROMUCOSAL
  Filled 2015-03-05: qty 15

## 2015-03-05 MED ORDER — CETYLPYRIDINIUM CHLORIDE 0.05 % MT LIQD: 7.0000 mL | Freq: Two times a day (BID) | OROMUCOSAL | Status: DC

## 2015-03-05 NOTE — Discharge Summary (Signed)
Physician Discharge Summary  UNIKA NAZARENO XLK:[id number] DOB: August 18, 1956 DOA: 03/04/2015  PCP: Purvis Kilts, MD  Admit date: 03/04/2015 Discharge date: 03/05/2015  Time spent: 35 minutes  Recommendations for Outpatient Follow-up:  Follow up with cardiology for event monitor. \ If persist will need also GI evaluation.   Discharge Diagnoses:    Chest pain, if persist will need also GI evaluation.    GERD   Discharge Condition: stable.   Diet recommendation: heart healthy  Filed Weights   03/04/15 2111  Weight: 124.195 kg (273 lb 12.8 oz)    History of present illness:  Robin [name] is a 59 y.o. female with history of GERD HTN Hyperlipidemia Anxiety OSA on CPAP presents with chest pain. Patient states that she has had a lot of issues with her mother at home and today around 10-11 AM she had an argument with her mother. She then started to have pain in her chest. She stats that she pain is now more in the epigastric area. Seems to be more of a burning heaviness now after she received some pain medicine. She states that she had a little shortness of breath. She now feels a little sick on her stomach. Patient states that she has had some sweating. The pain is not radiating down the arms or neck. She states her left hand seems to be tingling a little bit now. She does not smoke and she stats that she does not drink. She does admit to history of GERD in the past and currently her pain is almost like the GERD pain. She has no recent travel. She did got to [place] in 10/10/2023 for the death of her father.  Hospital Course:  1-Chest Pain -atypical symptoms not relieved by NTG and not relieved by MSO4. -ECHO pending.  -Troponin times 2 negative.  -CT angio negative for PE.  -chest pain free. Ok to discharge home/  GERD -continue with PPI and GI cocktail as needed  Anemia -stool occult blood -saturation low, ferritin low.  Needs outpatient screening colonoscopy.  Iron trial.    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  Cardiovascular: S 1, S 2 RRR Respiratory: CTA  Discharge Instructions    Current Discharge Medication List    CONTINUE these medications which have NOT CHANGED   Details  amitriptyline (ELAVIL) 25 MG tablet Take 1 tablet (25 mg total) by mouth at bedtime. For 2 weeks. Qty: 14 tablet, Refills: 0    aspirin 81 MG tablet Take 81 mg by mouth at bedtime.     buPROPion (WELLBUTRIN XL) 300 MG 24 hr tablet Take 300 mg by mouth every morning.     CELEBREX 200 MG capsule Take 200 mg by mouth daily.     co-enzyme Q-10 30 MG capsule Take 30 mg by mouth daily.     colesevelam (WELCHOL) 625 MG tablet Take 1,875 mg by mouth 2 (two) times daily with a meal.     dexlansoprazole (DEXILANT) 60 MG capsule Take 1 capsule (60 mg total) by mouth daily. Qty: 90 capsule, Refills: 3  fexofenadine (ALLEGRA) 180 MG tablet Take 180 mg by mouth daily.    fluticasone (FLONASE) 50 MCG/ACT nasal spray Place 2 sprays into the nose daily as needed. For congestion    gabapentin (NEURONTIN) 300 MG capsule Take 300 mg by mouth 2 (two) times daily.     HYDROcodone-acetaminophen (NORCO/VICODIN) 5-325 MG per tablet Take 1 tablet by mouth every 6 (six) hours as needed. For pain Refills: 0    levothyroxine (SYNTHROID, LEVOTHROID) 75 MCG tablet Take 75 mcg by mouth every morning. Refills: 2    Linaclotide (LINZESS) 290 MCG CAPS capsule Take 1 capsule (290 mcg total) by mouth daily. Qty: 90 capsule, Refills: 3     losartan-hydrochlorothiazide (HYZAAR) 100-25 MG per tablet Take 1 tablet by mouth daily.    metformin (FORTAMET) 500 MG (OSM) 24 hr tablet Take 500 mg by mouth daily with breakfast.    Multiple Vitamin (MULTIVITAMIN) tablet Take 1 tablet by mouth daily.      Omega-3 Fatty Acids (FISH OIL PO) Take 1 capsule by mouth at bedtime.    traMADol (ULTRAM) 50 MG tablet Take 50 mg by mouth every 6 (six) hours as needed for moderate pain.       STOP taking these medications     triamcinolone cream (KENALOG) 0.1 %        No Known Allergies    The results of significant diagnostics from this hospitalization (including imaging, microbiology, ancillary and laboratory) are listed below for reference.    Significant Diagnostic Studies: Dg Chest 2 View  03/04/2015   CLINICAL DATA:  Heart flutters with chest pressure three days.  EXAM: CHEST  2 VIEW  COMPARISON:  03/15/2012  FINDINGS: Lungs are clear. Cardiomediastinal silhouette and remainder of the exam is unchanged.  IMPRESSION: No active cardiopulmonary disease.   Electronically Signed   By: Marin Olp M.D.   On: 03/04/2015 16:16   Ct Angio Chest Pe W/cm &/or Wo Cm  03/04/2015   CLINICAL DATA:  Acute onset of upper chest heaviness and shortness of breath. Initial encounter.  EXAM: CT ANGIOGRAPHY CHEST WITH CONTRAST  TECHNIQUE: Multidetector CT imaging of the chest was performed using the standard protocol during bolus administration of intravenous contrast. Multiplanar CT image reconstructions and MIPs were obtained to evaluate the vascular anatomy.  CONTRAST:  166mL OMNIPAQUE IOHEXOL 350 MG/ML SOLN  COMPARISON:  Chest radiograph performed earlier today at 3:41 p.m.  FINDINGS: There is no evidence of pulmonary embolus.  Minimal bilateral atelectasis is noted. The lungs are otherwise clear. There is no evidence of significant focal consolidation, pleural effusion or pneumothorax. No masses are identified; no abnormal focal contrast enhancement is  seen.  The mediastinum is unremarkable in appearance. No mediastinal lymphadenopathy is seen. The great vessels are grossly unremarkable in appearance. No pericardial effusion is identified. No axillary lymphadenopathy is seen. The thyroid gland is unremarkable in appearance.  The visualized portions of the liver and spleen are unremarkable.  No acute osseous abnormalities are seen.  Review of the MIP images confirms the above findings.  IMPRESSION: 1. No evidence of pulmonary embolus. 2. Minimal bilateral atelectasis noted; lungs otherwise clear.   Electronically Signed   By: Garald Balding M.D.   On: 03/04/2015 21:03    Microbiology: Recent Results (from the past 240 hour(s))  MRSA PCR Screening     Status: None   Collection Time: 03/04/15  9:37 PM  Result Value Ref Range Status   MRSA by PCR NEGATIVE NEGATIVE Final    Comment:  The GeneXpert MRSA Assay (FDA approved for NASAL specimens only), is one component of a comprehensive MRSA colonization surveillance program. It is not intended to diagnose MRSA infection nor to guide or monitor treatment for MRSA infections.      Labs: Basic Metabolic Panel:  Recent Labs Lab 03/04/15 1645  NA 139  K 3.8  CL 105  CO2 28  GLUCOSE 105*  BUN 9  CREATININE 0.95  CALCIUM 8.9   Liver Function Tests: No results for input(s): AST, ALT, ALKPHOS, BILITOT, PROT, ALBUMIN in the last 168 hours. No results for input(s): LIPASE, AMYLASE in the last 168 hours. No results for input(s): AMMONIA in the last 168 hours. CBC:  Recent Labs Lab 03/04/15 1645  WBC 7.7  HGB 11.8*  HCT 37.1  MCV 90.0  PLT 353   Cardiac Enzymes:  Recent Labs Lab 03/04/15 2116 03/05/15 0212  TROPONINI <0.03 <0.03   BNP: BNP (last 3 results) No results for input(s): BNP in the last 8760 hours.  ProBNP (last 3 results) No results for input(s): PROBNP in the last 8760 hours.  CBG: No results for input(s): GLUCAP in the last 168  hours.     Signed:  Niel Hummer A  [facility] 03/05/2015, 9:00 AM

## 2015-03-05 NOTE — Progress Notes (Signed)
Briefly seen and examined. RRR, s1/s2, no murmur, clear lungs, no edema, morbidly obese, CPAP in room. Chest pain has resolved - felt more like palpitations, has been having for weeks. Had argument with Mom and felt heart palpitating, then a "burning" SSCP on way to urgent care. EKG showed non-specific T wave changes which resolved. Patient of Dr. Gwenlyn Found in the past. Had a stress test years ago. CT angio during this admission negative for PE - no coronary calcium. She had cardiac catheterization in 2003 by Dr. Gwenlyn Found which demonstrated essentially normal coronary arteries.   Chest pain sounds atypical - palpitations may represent arrhythmia or stress. No abnormal rhythms noted while on telemetry. Will arrange for outpatient stress test and monitor and follow-up with Dr. Gwenlyn Found.  [place] for discharge from a cardiac standpoint.  Pixie Casino, MD, [facility] Attending Cardiologist [place]

## 2015-03-06 LAB — HEMOGLOBIN A1C
Hgb A1c MFr Bld: 5.6 % (ref 4.8–5.6)
MEAN PLASMA GLUCOSE: 114 mg/dL

## 2015-03-08 ENCOUNTER — Ambulatory Visit (INDEPENDENT_AMBULATORY_CARE_PROVIDER_SITE_OTHER): Payer: 59

## 2015-03-08 ENCOUNTER — Ambulatory Visit
Admission: RE | Admit: 2015-03-08 | Discharge: 2015-03-08 | Disposition: A | Discharge status: Home / Self Care | Payer: 59 | Source: Ambulatory Visit | Attending: Sports Medicine | Admitting: Sports Medicine

## 2015-03-08 ENCOUNTER — Telehealth (HOSPITAL_COMMUNITY): Payer: Self-pay

## 2015-03-08 DIAGNOSIS — R072 Precordial pain: Secondary | ICD-10-CM

## 2015-03-08 DIAGNOSIS — M5440 Lumbago with sciatica, unspecified side: Secondary | ICD-10-CM

## 2015-03-08 DIAGNOSIS — R002 Palpitations: Secondary | ICD-10-CM | POA: Diagnosis not present

## 2015-03-08 NOTE — Patient Instructions (Signed)
Cardiac Event Monitoring A cardiac event monitor is a small recording device used to help detect abnormal heart rhythms (arrhythmias). The monitor is used to record heart rhythm when noticeable symptoms such as the following occur:  Fast heartbeats (palpitations), such as heart racing or fluttering.  Dizziness.  Fainting or light-headedness.  Unexplained weakness. The monitor is wired to two electrodes placed on your chest. Electrodes are flat, sticky disks that attach to your skin. The monitor can be worn for up to 30 days. You will wear the monitor at all times, except when bathing.  HOW TO USE YOUR CARDIAC EVENT MONITOR A technician will prepare your chest for the electrode placement. The technician will show you how to place the electrodes, how to work the monitor, and how to replace the batteries. Take time to practice using the monitor before you leave the office. Make sure you understand how to send the information from the monitor to your health care provider. This requires a telephone with a landline, not a cell phone. You need to:  Wear your monitor at all times, except when you are in water:  Do not get the monitor wet.  Take the monitor off when bathing. Do not swim or use a hot tub with it on.  Keep your skin clean. Do not put body lotion or moisturizer on your chest.  Change the electrodes daily or any time they stop sticking to your skin. You might need to use tape to keep them on.  It is possible that your skin under the electrodes could become irritated. To keep this from happening, try to put the electrodes in slightly different places on your chest. However, they must remain in the area under your left breast and in the upper right section of your chest.  Make sure the monitor is safely clipped to your clothing or in a location close to your body that your health care provider recommends.  Press the button to record when you feel symptoms of heart trouble, such as  dizziness, weakness, light-headedness, palpitations, thumping, shortness of breath, unexplained weakness, or a fluttering or racing heart. The monitor is always on and records what happened slightly before you pressed the button, so do not worry about being too late to get good information.  Keep a diary of your activities, such as walking, doing chores, and taking medicine. It is especially important to note what you were doing when you pushed the button to record your symptoms. This will help your health care provider determine what might be contributing to your symptoms. The information stored in your monitor will be reviewed by your health care provider alongside your diary entries.  Send the recorded information as recommended by your health care provider. It is important to understand that it will take some time for your health care provider to process the results.  Change the batteries as recommended by your health care provider. SEEK IMMEDIATE MEDICAL CARE IF:   You have chest pain.  You have extreme difficulty breathing or shortness of breath.  You develop a very fast heartbeat that persists.  You develop dizziness that does not go away.  You faint or constantly feel you are about to faint. Document Released: 06/30/2008 Document Revised: 02/05/2014 Document Reviewed: 03/20/2013 Exit[employer] Patient Information 2015 [employer]. This information is not intended to replace advice given to you by your health care provider. Make sure you discuss any questions you have with your health care provider.  

## 2015-03-08 NOTE — Telephone Encounter (Signed)
Encounter Complete.  

## 2015-03-08 NOTE — Telephone Encounter (Signed)
Encounter complete. 

## 2015-03-08 NOTE — Progress Notes (Unsigned)
Pt reported for 1 week event monitor wear. Use and maintenance discussed, along w/ symptom recording. All questions addressed.

## 2015-03-13 ENCOUNTER — Ambulatory Visit (HOSPITAL_COMMUNITY)
Admission: RE | Admit: 2015-03-13 | Discharge: 2015-03-13 | Disposition: A | Discharge status: Home / Self Care | Payer: 59 | Source: Ambulatory Visit | Attending: Cardiovascular Disease | Admitting: Cardiovascular Disease

## 2015-03-13 DIAGNOSIS — E039 Hypothyroidism, unspecified: Secondary | ICD-10-CM | POA: Diagnosis not present

## 2015-03-13 DIAGNOSIS — E119 Type 2 diabetes mellitus without complications: Secondary | ICD-10-CM | POA: Insufficient documentation

## 2015-03-13 DIAGNOSIS — G4733 Obstructive sleep apnea (adult) (pediatric): Secondary | ICD-10-CM | POA: Diagnosis not present

## 2015-03-13 DIAGNOSIS — E669 Obesity, unspecified: Secondary | ICD-10-CM | POA: Insufficient documentation

## 2015-03-13 DIAGNOSIS — I10 Essential (primary) hypertension: Secondary | ICD-10-CM | POA: Diagnosis not present

## 2015-03-13 DIAGNOSIS — R9439 Abnormal result of other cardiovascular function study: Secondary | ICD-10-CM | POA: Diagnosis not present

## 2015-03-13 DIAGNOSIS — R42 Dizziness and giddiness: Secondary | ICD-10-CM | POA: Diagnosis not present

## 2015-03-13 DIAGNOSIS — R5383 Other fatigue: Secondary | ICD-10-CM | POA: Insufficient documentation

## 2015-03-13 DIAGNOSIS — R072 Precordial pain: Secondary | ICD-10-CM | POA: Diagnosis not present

## 2015-03-13 DIAGNOSIS — Z87891 Personal history of nicotine dependence: Secondary | ICD-10-CM | POA: Diagnosis not present

## 2015-03-13 DIAGNOSIS — R002 Palpitations: Secondary | ICD-10-CM | POA: Diagnosis not present

## 2015-03-13 MED ORDER — REGADENOSON 0.4 MG/5ML IV SOLN
0.4000 mg | Freq: Once | INTRAVENOUS | Status: AC
  Administered 2015-03-13: 0.4 mg via INTRAVENOUS

## 2015-03-13 MED ORDER — TECHNETIUM TC 99M SESTAMIBI GENERIC - CARDIOLITE
31.0000 | Freq: Once | INTRAVENOUS | Status: AC | PRN
  Administered 2015-03-13: 31 via INTRAVENOUS

## 2015-03-14 ENCOUNTER — Ambulatory Visit (HOSPITAL_COMMUNITY)
Admission: RE | Admit: 2015-03-14 | Discharge: 2015-03-14 | Disposition: A | Discharge status: Home / Self Care | Payer: 59 | Source: Ambulatory Visit | Attending: Cardiovascular Disease | Admitting: Cardiovascular Disease

## 2015-03-14 LAB — MYOCARDIAL PERFUSION IMAGING
CHL CUP NUCLEAR SDS: 4
CHL CUP NUCLEAR SRS: 0
CHL CUP NUCLEAR SSS: 4
CSEPPHR: 92 {beats}/min
LVDIAVOL: 92 mL
LVSYSVOL: 30 mL
NUC STRESS TID: 0.85
Nuc Stress EF: 67 %
Rest HR: 74 {beats}/min

## 2015-03-14 MED ORDER — TECHNETIUM TC 99M SESTAMIBI GENERIC - CARDIOLITE
30.1000 | Freq: Once | INTRAVENOUS | Status: AC | PRN
  Administered 2015-03-14: 30.1 via INTRAVENOUS

## 2015-04-02 ENCOUNTER — Other Ambulatory Visit: Payer: Self-pay

## 2015-04-02 ENCOUNTER — Encounter: Payer: Self-pay | Admitting: Internal Medicine

## 2015-04-02 ENCOUNTER — Ambulatory Visit (INDEPENDENT_AMBULATORY_CARE_PROVIDER_SITE_OTHER): Payer: 59 | Admitting: Internal Medicine

## 2015-04-02 VITALS — BP 134/70 | HR 73 | Temp 97.4°F | Ht 68.0 in | Wt 272.2 lb

## 2015-04-02 DIAGNOSIS — K219 Gastro-esophageal reflux disease without esophagitis: Secondary | ICD-10-CM

## 2015-04-02 DIAGNOSIS — R6881 Early satiety: Secondary | ICD-10-CM | POA: Diagnosis not present

## 2015-04-02 DIAGNOSIS — K5900 Constipation, unspecified: Secondary | ICD-10-CM | POA: Diagnosis not present

## 2015-04-02 DIAGNOSIS — R109 Unspecified abdominal pain: Secondary | ICD-10-CM

## 2015-04-02 MED ORDER — LINACLOTIDE 290 MCG PO CAPS: 290.0000 ug | ORAL_CAPSULE | Freq: Every day | ORAL | Status: DC

## 2015-04-02 MED ORDER — DEXLANSOPRAZOLE 60 MG PO CPDR: 60.0000 mg | DELAYED_RELEASE_CAPSULE | Freq: Every day | ORAL | Status: DC

## 2015-04-02 NOTE — Patient Instructions (Signed)
Schedule a diagnostic EGD (early satiety and abdominal pain)  Further recommendations to follow  Continue Linzess 290 daily  Continue Dexilant 60 mg daily

## 2015-04-02 NOTE — Progress Notes (Signed)
Primary Care Physician:  Purvis Kilts, MD Primary Gastroenterologist:  Dr. [name]  Pre-Procedure History & Physical: HPI:  Robin [name] is a 59 y.o. female here for followup of GERD and constipation - [place] working well for her bowel function. Also, Dexilant 60 mg daily controlling reflux nicely. No dysphagia. Does have a 2 month history of progressive early satiety   feeling of fullness; eats little and feels full all day long. Some vague epigastric pain. No vomiting. On metformin however hemoglobin A1c 5.6 recently. Patient denies out and out  diagnosis of diabetes. Hasn't had any melena or hematochezia. She's gained 5 pounds since her last office visit.  Last colonoscopy 2012-inflamed ileum.  Past Medical History  Diagnosis Date  . GERD (gastroesophageal reflux disease)   . Constipation   . Anxiety disorder   . Hyperlipidemia   . Hypertension   . Chronic neck pain   . Schatzki's ring     non critical  . Hiatal hernia     small  . Sleep apnea     CPAP, Sleep study at [facility]  . Arthritis     Past Surgical History  Procedure Laterality Date  . Knee arthroscopy    . Cardiac catheterization    . Vaginal prolapse repair    . Rectocele repair    . Esophagogastroduodenoscopy  08/23/2008    SWN:IOEV distal esophageal erosions consistent with mild erosive reflux esophagitis, otherwise unremarkable esophagus/ Tiny antral erosions of doubtful clinical significance, otherwise normal stomach, patent pylorus, normal D1 and D2  . Colonoscopy  2006    Dr. [name], hyperplastic polyps  . Colonoscopy  08/06/11    abnormal terminal ileum for 10cm, erosions, geographical ulceration. Bx small bowel mucosa with prominent intramucosal lymphoid aggregates, slightly inflammed  . Esophagogastroduodenoscopy  08/06/11    small hh, noncritical Schatzki's ring s/p 45 F  . Abdominal hysterectomy    . Direct laryngoscopy  03/15/2012    Procedure: DIRECT LARYNGOSCOPY;  Surgeon:  [name], MD;  Location: [place];  Service: ENT;  Laterality: N/A; Dr. Wolicki--:>no foreign body seen. normal esophagus to 40cm  . Esophagoscopy  03/15/2012    Procedure: ESOPHAGOSCOPY;  Surgeon: [name], MD;  Location: [facility] OR;  Service: ENT;  Laterality: N/A;  . 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 Authorizing Provider  amitriptyline (ELAVIL) 25 MG tablet Take 1 tablet (25 mg total) by mouth at bedtime. For 2 weeks. 02/17/15  Yes Melony Overly, MD  aspirin 81 MG tablet Take 81 mg by mouth at bedtime.    Yes Historical Provider, MD  buPROPion (WELLBUTRIN XL) 300 MG 24 hr tablet Take 300 mg by mouth every morning.  09/08/11  Yes Historical Provider, MD  co-enzyme Q-10 30 MG capsule Take 30 mg by mouth daily.    Yes Historical Provider, MD  colesevelam (WELCHOL) 625 MG tablet Take 1,875 mg by mouth 2 (two) times daily with a meal.    Yes Historical Provider, MD  dexlansoprazole (DEXILANT) 60 MG capsule Take 1 capsule (60 mg total) by mouth daily. 07/02/14  Yes Mahala Menghini, PA-C  ferrous sulfate 325 (65 FE) MG tablet Take 1 tablet (325 mg total) by mouth 2 (two) times daily with a meal. 03/05/15  Yes Belkys A Regalado, MD  fexofenadine (ALLEGRA) 180 MG tablet Take 180 mg by mouth daily.  Yes Historical Provider, MD  gabapentin (NEURONTIN) 300 MG capsule Take 300 mg by mouth 2 (two) times daily.  02/11/12  Yes Historical Provider, MD  HYDROcodone-acetaminophen (NORCO/VICODIN) 5-325 MG per tablet Take 1 tablet by mouth every 6 (six) hours as needed. For pain 02/07/15  Yes Historical Provider, MD  levothyroxine (SYNTHROID, LEVOTHROID) 75 MCG tablet Take 75 mcg by mouth every morning. 02/07/15  Yes Historical Provider, MD  Linaclotide (LINZESS) 290 MCG CAPS capsule Take 1 capsule (290 mcg total) by mouth daily. Patient taking differently: Take 290 mcg by mouth every other day.  10/31/14  Yes Mahala Menghini, PA-C  losartan-hydrochlorothiazide (HYZAAR) 100-25 MG per tablet Take 1 tablet by mouth daily.   Yes Historical Provider, MD  metformin (FORTAMET) 500 MG (OSM) 24 hr tablet Take 500 mg by mouth daily with breakfast.   Yes Historical Provider, MD  Multiple Vitamin (MULTIVITAMIN) tablet Take 1 tablet by mouth daily.     Yes Historical Provider, MD  Omega-3 Fatty Acids (FISH OIL PO) Take 1 capsule by mouth at bedtime.   Yes Historical Provider, MD  traMADol (ULTRAM) 50 MG tablet Take 50 mg by mouth every 6 (six) hours as needed for moderate pain.    Yes Historical Provider, MD    Allergies as of 04/02/2015  . (No Known Allergies)    Family History  Problem Relation Age of Onset  . Kidney cancer Father   . Stomach cancer      aunt  . Breast cancer      grandmother  . Colon cancer Neg Hx     History   Social History  . Marital Status: Married    Spouse Name: N/A  . Number of Children: 4  . Years of Education: N/A   Occupational History  .     Social History Main Topics  . Smoking status: Former Smoker -- 0.50 packs/day    Types: Cigarettes  . Smokeless tobacco: Not on file     Comment: 17 yrs ago  . Alcohol Use: No  . Drug Use: No  . Sexual Activity: Not on file   Other Topics Concern  . Not on file   Social History Narrative    Review of Systems: See HPI, otherwise negative ROS  Physical Exam: BP 134/70 mmHg  Pulse 73  Temp(Src) 97.4 F (36.3 C) (Oral)  Wt 272 lb 3.2 oz (123.469 kg)  PF 58 L/min General:   Alert,  Well-developed, obese pleasant and cooperative in NAD Skin:  Intact without significant lesions or rashes. Eyes:  Sclera clear, no icterus.   Conjunctiva pink. Ears:  Normal auditory acuity. Nose:  No deformity, discharge,  or lesions. Mouth:  No deformity or lesions. Neck:  Supple; no masses or thyromegaly. No significant cervical adenopathy. Lungs:  Clear throughout to auscultation.   No wheezes, crackles, or rhonchi. No acute  distress. Heart:  Regular rate and rhythm; no murmurs, clicks, rubs,  or gallops. Abdomen: obese.  positive bowel sounds. No succussion splash. No obvious mass or organomegaly. Again they epigastric tenderness. Pulses:  Normal pulses noted. Extremities:  Without clubbing or edema.  Impression:  Pleasant obese 59 year old lady with multiple medical problems now complains of vague epigastric pain and early satiety with some nausea but no vomiting. GERD well-controlled on Dexilant. Constipation well controlled on  Linzess.  She could have gastroparesis. Other diagnostic possibilities exist. She does take nonsteroidal agents. She needs her luminal upper GI tract assessed.   Recommendations:  The risks, benefits, limitations, alternatives and imponderables have been reviewed with the patient. Potential for esophageal dilation, biopsy, etc. have also been reviewed.  Questions have been answered. All parties agreeable. Further recommendations to follow.  Notice: This dictation was prepared with Dragon dictation along with smaller phrase technology. Any transcriptional errors that result from this process are unintentional and may not be corrected upon review.

## 2015-04-03 MED ORDER — LINACLOTIDE 290 MCG PO CAPS: 290.0000 ug | ORAL_CAPSULE | Freq: Every day | ORAL | Status: DC

## 2015-04-03 MED ORDER — DEXLANSOPRAZOLE 60 MG PO CPDR
60.0000 mg | DELAYED_RELEASE_CAPSULE | Freq: Every day | ORAL | Status: DC
Start: 2015-04-03 — End: 2015-12-09

## 2015-04-05 ENCOUNTER — Encounter: Payer: Self-pay | Admitting: *Deleted

## 2015-04-16 ENCOUNTER — Ambulatory Visit: Payer: 59 | Admitting: Cardiovascular Disease

## 2015-04-16 ENCOUNTER — Telehealth: Payer: Self-pay

## 2015-04-16 NOTE — Telephone Encounter (Signed)
Called [employer] and the pending PA # for her EGD is [id number]

## 2015-04-17 ENCOUNTER — Encounter (HOSPITAL_COMMUNITY)
Admission: RE | Disposition: A | Discharge status: Home / Self Care | Payer: Self-pay | Source: Ambulatory Visit | Attending: Internal Medicine

## 2015-04-17 ENCOUNTER — Ambulatory Visit (HOSPITAL_COMMUNITY)
Admission: RE | Admit: 2015-04-17 | Discharge: 2015-04-17 | Disposition: A | Discharge status: Home / Self Care | Payer: 59 | Source: Ambulatory Visit | Attending: Internal Medicine | Admitting: Internal Medicine

## 2015-04-17 ENCOUNTER — Encounter (HOSPITAL_COMMUNITY): Payer: Self-pay | Admitting: *Deleted

## 2015-04-17 DIAGNOSIS — Z87891 Personal history of nicotine dependence: Secondary | ICD-10-CM | POA: Diagnosis not present

## 2015-04-17 DIAGNOSIS — G8929 Other chronic pain: Secondary | ICD-10-CM | POA: Diagnosis not present

## 2015-04-17 DIAGNOSIS — Z79899 Other long term (current) drug therapy: Secondary | ICD-10-CM | POA: Diagnosis not present

## 2015-04-17 DIAGNOSIS — G4730 Sleep apnea, unspecified: Secondary | ICD-10-CM | POA: Insufficient documentation

## 2015-04-17 DIAGNOSIS — R6881 Early satiety: Secondary | ICD-10-CM | POA: Insufficient documentation

## 2015-04-17 DIAGNOSIS — I10 Essential (primary) hypertension: Secondary | ICD-10-CM | POA: Diagnosis not present

## 2015-04-17 DIAGNOSIS — R1013 Epigastric pain: Secondary | ICD-10-CM | POA: Insufficient documentation

## 2015-04-17 DIAGNOSIS — R1319 Other dysphagia: Secondary | ICD-10-CM | POA: Diagnosis present

## 2015-04-17 DIAGNOSIS — M1990 Unspecified osteoarthritis, unspecified site: Secondary | ICD-10-CM | POA: Insufficient documentation

## 2015-04-17 DIAGNOSIS — F419 Anxiety disorder, unspecified: Secondary | ICD-10-CM | POA: Insufficient documentation

## 2015-04-17 DIAGNOSIS — E669 Obesity, unspecified: Secondary | ICD-10-CM | POA: Diagnosis not present

## 2015-04-17 DIAGNOSIS — K219 Gastro-esophageal reflux disease without esophagitis: Secondary | ICD-10-CM | POA: Diagnosis not present

## 2015-04-17 DIAGNOSIS — E785 Hyperlipidemia, unspecified: Secondary | ICD-10-CM | POA: Diagnosis not present

## 2015-04-17 DIAGNOSIS — Z6841 Body Mass Index (BMI) 40.0 and over, adult: Secondary | ICD-10-CM | POA: Insufficient documentation

## 2015-04-17 DIAGNOSIS — R110 Nausea: Secondary | ICD-10-CM | POA: Insufficient documentation

## 2015-04-17 DIAGNOSIS — R1314 Dysphagia, pharyngoesophageal phase: Secondary | ICD-10-CM | POA: Diagnosis not present

## 2015-04-17 DIAGNOSIS — M542 Cervicalgia: Secondary | ICD-10-CM | POA: Insufficient documentation

## 2015-04-17 DIAGNOSIS — K5900 Constipation, unspecified: Secondary | ICD-10-CM | POA: Diagnosis not present

## 2015-04-17 DIAGNOSIS — R109 Unspecified abdominal pain: Secondary | ICD-10-CM

## 2015-04-17 DIAGNOSIS — Z9989 Dependence on other enabling machines and devices: Secondary | ICD-10-CM | POA: Diagnosis not present

## 2015-04-17 HISTORY — PX: ESOPHAGEAL DILATION: SHX303

## 2015-04-17 HISTORY — DX: Type 2 diabetes mellitus without complications: E11.9

## 2015-04-17 HISTORY — PX: ESOPHAGOGASTRODUODENOSCOPY: SHX5428

## 2015-04-17 SURGERY — EGD (ESOPHAGOGASTRODUODENOSCOPY)
Anesthesia: Moderate Sedation

## 2015-04-17 MED ORDER — LIDOCAINE VISCOUS 2 % MT SOLN
OROMUCOSAL | Status: AC
  Filled 2015-04-17: qty 15

## 2015-04-17 MED ORDER — MEPERIDINE HCL 100 MG/ML IJ SOLN
INTRAMUSCULAR | Status: AC
  Filled 2015-04-17: qty 2

## 2015-04-17 MED ORDER — MIDAZOLAM HCL 5 MG/5ML IJ SOLN
INTRAMUSCULAR | Status: DC | PRN
  Administered 2015-04-17 (×3): 1 mg via INTRAVENOUS
  Administered 2015-04-17: 2 mg via INTRAVENOUS

## 2015-04-17 MED ORDER — LIDOCAINE VISCOUS 2 % MT SOLN
OROMUCOSAL | Status: DC | PRN
  Administered 2015-04-17: 6 mL via OROMUCOSAL

## 2015-04-17 MED ORDER — STERILE WATER FOR IRRIGATION IR SOLN
Status: DC | PRN
  Administered 2015-04-17: 10:00:00

## 2015-04-17 MED ORDER — MIDAZOLAM HCL 5 MG/5ML IJ SOLN
INTRAMUSCULAR | Status: AC
  Filled 2015-04-17: qty 10

## 2015-04-17 MED ORDER — ONDANSETRON HCL 4 MG/2ML IJ SOLN
INTRAMUSCULAR | Status: AC
  Filled 2015-04-17: qty 2

## 2015-04-17 MED ORDER — MEPERIDINE HCL 100 MG/ML IJ SOLN
INTRAMUSCULAR | Status: DC | PRN
  Administered 2015-04-17: 50 mg via INTRAVENOUS
  Administered 2015-04-17 (×2): 25 mg via INTRAVENOUS

## 2015-04-17 MED ORDER — SODIUM CHLORIDE 0.9 % IV SOLN
INTRAVENOUS | Status: DC
  Administered 2015-04-17: 1000 mL via INTRAVENOUS

## 2015-04-17 MED ORDER — ONDANSETRON HCL 4 MG/2ML IJ SOLN
INTRAMUSCULAR | Status: DC | PRN
  Administered 2015-04-17: 4 mg via INTRAVENOUS

## 2015-04-17 NOTE — Op Note (Signed)
[facility] [address] [place], [address]   ENDOSCOPY PROCEDURE REPORT  PATIENT: Robin, [name]  MR#: [id number] BIRTHDATE: July 16, 1956 , 28  yrs. old GENDER: female ENDOSCOPIST: R.  Garfield Cornea, MD FACP FACG REFERRED BY:  Sharilyn Sites, M.D. PROCEDURE DATE:  05/16/2015 PROCEDURE:  EGD, diagnostic and Maloney dilation of esophagus INDICATIONS:  Early satiety; epigastric pain; esophageal dysphagia.  MEDICATIONS: Versed 5 mg IV and Demerol 100 mg IV in divided doses. Xylocaine gel orally.  Zofran 4 mg IV. ASA CLASS:      Class II  CONSENT: The risks, benefits, limitations, alternatives and imponderables have been discussed.  The potential for biopsy, esophogeal dilation, etc. have also been reviewed.  Questions have been answered.  All parties agreeable.  Please see the history and physical in the medical record for more information.  DESCRIPTION OF PROCEDURE: After the risks benefits and alternatives of the procedure were thoroughly explained, informed consent was obtained.  The EG-[id number] ([id number]) endoscope was introduced through the mouth and advanced to the second portion of the duodenum , limited by Without limitations. The instrument was slowly withdrawn as the mucosa was fully examined. Estimated blood loss is zero unless otherwise noted in this procedure report.    Tubular esophagus appeared normal throughout its course.  It was patent. Stomach empty.  Normal-appearing gastric mucosa.  Patent pylorus. Normal-appearing first and second portion of the duodenum. Scope was withdrawn and a 54 [place] Maloney dilator was passed to the insertion without resistance.  A look back revealed no apparent complications related to this maneuver.  Retroflexed views revealed no abnormalities.     The scope was then withdrawn from the patient and the procedure completed.  COMPLICATIONS: There were no immediate complications.  ENDOSCOPIC IMPRESSION: Normal  EGD?"status post Maloney dilation as described above  RECOMMENDATIONS: Continue dexilant 60 mg daily. Right upper quadrant ultrasound to look at gallbladder in the near future. Office visit with [name] in 6 weeks.  REPEAT EXAM:  eSigned:  R. Garfield Cornea, MD Rosalita Chessman [facility] 2015-05-16 10:19 AM    CC:  CPT CODES: ICD CODES:  The ICD and CPT codes recommended by this software are interpretations from the data that the clinical staff has captured with the software.  The verification of the translation of this report to the ICD and CPT codes and modifiers is the sole responsibility of the health care institution and practicing physician where this report was generated.  [employer] will not be held responsible for the validity of the ICD and CPT codes included on this report.  AMA assumes no liability for data contained or not contained herein. CPT is a Designer, television/film set of the [employer].  PATIENT NAME:  Robin, [name] MR#: [id number]

## 2015-04-17 NOTE — Interval H&P Note (Signed)
History and Physical Interval Note:  04/17/2015 9:30 AM  Robin [name]  has presented today for surgery, with the diagnosis of early satiety, abd pain  The various methods of treatment have been discussed with the patient and family. After consideration of risks, benefits and other options for treatment, the patient has consented to  Procedure(s) with comments: ESOPHAGOGASTRODUODENOSCOPY (EGD) (N/A) - 736 as a surgical intervention .  The patient's history has been reviewed, patient examined, no change in status, stable for surgery.  I have reviewed the patient's chart and labs.  Questions were answered to the patient's satisfaction.     Robin [name]  No change aside the patient does relate vague intermittent esophageal dysphagia to solid food and pills. History of noncritical Schatzki's ring. Therefore, diagnostic EGD being done today. Will plan for soft tissue dilation as feasible/appropriate.  The risks, benefits, limitations, alternatives and imponderables have been reviewed with the patient. Potential for esophageal dilation, biopsy, etc. have also been reviewed.  Questions have been answered. All parties agreeable.

## 2015-04-17 NOTE — Progress Notes (Signed)
Appointment for follow-up in 6 weeks -  With Neil Crouch, PA Aug. 22, 2016 at 9:30.

## 2015-04-17 NOTE — H&P (View-Only) (Signed)
Primary Care Physician:  Robin Kilts, MD Primary Gastroenterologist:  Dr. Gala [name]  Pre-Procedure History & Physical: HPI:  Robin [name] is a 59 y.o. female here for followup of GERD and constipation - [place] working well for her bowel function. Also, Dexilant 60 mg daily controlling reflux nicely. No dysphagia. Does have a 2 month history of progressive early satiety   feeling of fullness; eats little and feels full all day long. Some vague epigastric pain. No vomiting. On metformin however hemoglobin A1c 5.6 recently. Patient denies out and out  diagnosis of diabetes. Hasn't had any melena or hematochezia. She's gained 5 pounds since her last office visit.  Last colonoscopy 2012-inflamed ileum.  Past Medical History  Diagnosis Date  . GERD (gastroesophageal reflux disease)   . Constipation   . Anxiety disorder   . Hyperlipidemia   . Hypertension   . Chronic neck pain   . Schatzki's ring     non critical  . Hiatal hernia     small  . Sleep apnea     CPAP, Sleep study at [facility]  . Arthritis     Past Surgical History  Procedure Laterality Date  . Knee arthroscopy    . Cardiac catheterization    . Vaginal prolapse repair    . Rectocele repair    . Esophagogastroduodenoscopy  08/23/2008    XAJ:OINO distal esophageal erosions consistent with mild erosive reflux esophagitis, otherwise unremarkable esophagus/ Tiny antral erosions of doubtful clinical significance, otherwise normal stomach, patent pylorus, normal D1 and D2  . Colonoscopy  2006    Dr. Aviva [name], hyperplastic polyps  . Colonoscopy  08/06/11    abnormal terminal ileum for 10cm, erosions, geographical ulceration. Bx small bowel mucosa with prominent intramucosal lymphoid aggregates, slightly inflammed  . Esophagogastroduodenoscopy  08/06/11    small hh, noncritical Schatzki's ring s/p 51 F  . Abdominal hysterectomy    . Direct laryngoscopy  03/15/2012    Procedure: DIRECT LARYNGOSCOPY;  Surgeon:  Robin Marble, MD;  Location: [place];  Service: ENT;  Laterality: N/A; Dr. Wolicki--:>no foreign body seen. normal esophagus to 40cm  . Esophagoscopy  03/15/2012    Procedure: ESOPHAGOSCOPY;  Surgeon: Robin Marble, MD;  Location: [facility] OR;  Service: ENT;  Laterality: N/A;  . 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 Authorizing Provider  amitriptyline (ELAVIL) 25 MG tablet Take 1 tablet (25 mg total) by mouth at bedtime. For 2 weeks. 02/17/15  Yes Robin Overly, MD  aspirin 81 MG tablet Take 81 mg by mouth at bedtime.    Yes Historical Provider, MD  buPROPion (WELLBUTRIN XL) 300 MG 24 hr tablet Take 300 mg by mouth every morning.  09/08/11  Yes Historical Provider, MD  co-enzyme Q-10 30 MG capsule Take 30 mg by mouth daily.    Yes Historical Provider, MD  colesevelam (WELCHOL) 625 MG tablet Take 1,875 mg by mouth 2 (two) times daily with a meal.    Yes Historical Provider, MD  dexlansoprazole (DEXILANT) 60 MG capsule Take 1 capsule (60 mg total) by mouth daily. 07/02/14  Yes Robin Menghini, PA-C  ferrous sulfate 325 (65 FE) MG tablet Take 1 tablet (325 mg total) by mouth 2 (two) times daily with a meal. 03/05/15  Yes Robin A Regalado, MD  fexofenadine (ALLEGRA) 180 MG tablet Take 180 mg by mouth daily.  Yes Historical Provider, MD  gabapentin (NEURONTIN) 300 MG capsule Take 300 mg by mouth 2 (two) times daily.  02/11/12  Yes Historical Provider, MD  HYDROcodone-acetaminophen (NORCO/VICODIN) 5-325 MG per tablet Take 1 tablet by mouth every 6 (six) hours as needed. For pain 02/07/15  Yes Historical Provider, MD  levothyroxine (SYNTHROID, LEVOTHROID) 75 MCG tablet Take 75 mcg by mouth every morning. 02/07/15  Yes Historical Provider, MD  Linaclotide (LINZESS) 290 MCG CAPS capsule Take 1 capsule (290 mcg total) by mouth daily. Patient taking differently: Take 290 mcg by mouth every other day.  10/31/14  Yes Robin Menghini, PA-C  losartan-hydrochlorothiazide (HYZAAR) 100-25 MG per tablet Take 1 tablet by mouth daily.   Yes Historical Provider, MD  metformin (FORTAMET) 500 MG (OSM) 24 hr tablet Take 500 mg by mouth daily with breakfast.   Yes Historical Provider, MD  Multiple Vitamin (MULTIVITAMIN) tablet Take 1 tablet by mouth daily.     Yes Historical Provider, MD  Omega-3 Fatty Acids (FISH OIL PO) Take 1 capsule by mouth at bedtime.   Yes Historical Provider, MD  traMADol (ULTRAM) 50 MG tablet Take 50 mg by mouth every 6 (six) hours as needed for moderate pain.    Yes Historical Provider, MD    Allergies as of 04/02/2015  . (No Known Allergies)    Family History  Problem Relation Age of Onset  . Kidney cancer Father   . Stomach cancer      aunt  . Breast cancer      grandmother  . Colon cancer Neg Hx     History   Social History  . Marital Status: Married    Spouse Name: N/A  . Number of Children: 4  . Years of Education: N/A   Occupational History  .     Social History Main Topics  . Smoking status: Former Smoker -- 0.50 packs/day    Types: Cigarettes  . Smokeless tobacco: Not on file     Comment: 17 yrs ago  . Alcohol Use: No  . Drug Use: No  . Sexual Activity: Not on file   Other Topics Concern  . Not on file   Social History Narrative    Review of Systems: See HPI, otherwise negative ROS  Physical Exam: BP 134/70 mmHg  Pulse 73  Temp(Src) 97.4 F (36.3 C) (Oral)  Wt 272 lb 3.2 oz (123.469 kg)  PF 58 L/min General:   Alert,  Well-developed, obese pleasant and cooperative in NAD Skin:  Intact without significant lesions or rashes. Eyes:  Sclera clear, no icterus.   Conjunctiva pink. Ears:  Normal auditory acuity. Nose:  No deformity, discharge,  or lesions. Mouth:  No deformity or lesions. Neck:  Supple; no masses or thyromegaly. No significant cervical adenopathy. Lungs:  Clear throughout to auscultation.   No wheezes, crackles, or rhonchi. No acute  distress. Heart:  Regular rate and rhythm; no murmurs, clicks, rubs,  or gallops. Abdomen: obese.  positive bowel sounds. No succussion splash. No obvious mass or organomegaly. Again they epigastric tenderness. Pulses:  Normal pulses noted. Extremities:  Without clubbing or edema.  Impression:  Pleasant obese 59 year old lady with multiple medical problems now complains of vague epigastric pain and early satiety with some nausea but no vomiting. GERD well-controlled on Dexilant. Constipation well controlled on  Linzess.  She could have gastroparesis. Other diagnostic possibilities exist. She does take nonsteroidal agents. She needs her luminal upper GI tract assessed.   Recommendations:  The risks, benefits, limitations, alternatives and imponderables have been reviewed with the patient. Potential for esophageal dilation, biopsy, etc. have also been reviewed.  Questions have been answered. All parties agreeable. Further recommendations to follow.  Notice: This dictation was prepared with Dragon dictation along with smaller phrase technology. Any transcriptional errors that result from this process are unintentional and may not be corrected upon review.

## 2015-04-17 NOTE — Discharge Instructions (Signed)
EGD Discharge instructions Please read the instructions outlined below and refer to this sheet in the next few weeks. These discharge instructions provide you with general information on caring for yourself after you leave the hospital. Your doctor may also give you specific instructions. While your treatment has been planned according to the most current medical practices available, unavoidable complications occasionally occur. If you have any problems or questions after discharge, please call your doctor. ACTIVITY  You may resume your regular activity but move at a slower pace for the next 24 hours.   Take frequent rest periods for the next 24 hours.   Walking will help expel (get rid of) the air and reduce the bloated feeling in your abdomen.   No driving for 24 hours (because of the anesthesia (medicine) used during the test).   You may shower.   Do not sign any important legal documents or operate any machinery for 24 hours (because of the anesthesia used during the test).  NUTRITION  Drink plenty of fluids.   You may resume your normal diet.   Begin with a light meal and progress to your normal diet.   Avoid alcoholic beverages for 24 hours or as instructed by your caregiver.  MEDICATIONS  You may resume your normal medications unless your caregiver tells you otherwise.  WHAT YOU CAN EXPECT TODAY  You may experience abdominal discomfort such as a feeling of fullness or gas pains.  FOLLOW-UP  Your doctor will discuss the results of your test with you.  SEEK IMMEDIATE MEDICAL ATTENTION IF ANY OF THE FOLLOWING OCCUR:  Excessive nausea (feeling sick to your stomach) and/or vomiting.   Severe abdominal pain and distention (swelling).   Trouble swallowing.   Temperature over 101 F (37.8 C).   Rectal bleeding or vomiting of blood.    GERD information provided  Continue Dexilant 60 mg daily  Right upper quadrant ultrasound in the near future to evaluate  gallbladder  Office visit with [name] in 6 weeks  May 27, 2015 at 9:30am with Anselmo Rod at Dr. Roseanne Kaufman office  Gastroesophageal Reflux Disease, Adult Gastroesophageal reflux disease (GERD) happens when acid from your stomach flows up into the esophagus. When acid comes in contact with the esophagus, the acid causes soreness (inflammation) in the esophagus. Over time, GERD may create small holes (ulcers) in the lining of the esophagus. CAUSES   Increased body weight. This puts pressure on the stomach, making acid rise from the stomach into the esophagus.  Smoking. This increases acid production in the stomach.  Drinking alcohol. This causes decreased pressure in the lower esophageal sphincter (valve or ring of muscle between the esophagus and stomach), allowing acid from the stomach into the esophagus.  Late evening meals and a full stomach. This increases pressure and acid production in the stomach.  A malformed lower esophageal sphincter. Sometimes, no cause is found. SYMPTOMS   Burning pain in the lower part of the mid-chest behind the breastbone and in the mid-stomach area. This may occur twice a week or more often.  Trouble swallowing.  Sore throat.  Dry cough.  Asthma-like symptoms including chest tightness, shortness of breath, or wheezing. DIAGNOSIS  Your caregiver may be able to diagnose GERD based on your symptoms. In some cases, X-rays and other tests may be done to check for complications or to check the condition of your stomach and esophagus. TREATMENT  Your caregiver may recommend over-the-counter or prescription medicines to help decrease acid production. Ask your caregiver before  starting or adding any new medicines.  HOME CARE INSTRUCTIONS   Change the factors that you can control. Ask your caregiver for guidance concerning weight loss, quitting smoking, and alcohol consumption.  Avoid foods and drinks that make your symptoms worse, such as:  Caffeine or  alcoholic drinks.  Chocolate.  Peppermint or mint flavorings.  Garlic and onions.  Spicy foods.  Citrus fruits, such as oranges, lemons, or limes.  Tomato-based foods such as sauce, chili, salsa, and pizza.  Fried and fatty foods.  Avoid lying down for the 3 hours prior to your bedtime or prior to taking a nap.  Eat small, frequent meals instead of large meals.  Wear loose-fitting clothing. Do not wear anything tight around your waist that causes pressure on your stomach.  Raise the head of your bed 6 to 8 inches with wood blocks to help you sleep. Extra pillows will not help.  Only take over-the-counter or prescription medicines for pain, discomfort, or fever as directed by your caregiver.  Do not take aspirin, ibuprofen, or other nonsteroidal anti-inflammatory drugs (NSAIDs). SEEK IMMEDIATE MEDICAL CARE IF:   You have pain in your arms, neck, jaw, teeth, or back.  Your pain increases or changes in intensity or duration.  You develop nausea, vomiting, or sweating (diaphoresis).  You develop shortness of breath, or you faint.  Your vomit is green, yellow, black, or looks like coffee grounds or blood.  Your stool is red, bloody, or black. These symptoms could be signs of other problems, such as heart disease, gastric bleeding, or esophageal bleeding. MAKE SURE YOU:   Understand these instructions.  Will watch your condition.  Will get help right away if you are not doing well or get worse. Document Released: 07/01/2005 Document Revised: 12/14/2011 Document Reviewed: 04/10/2011 [facility] Patient Information 2015 [place], [place]. This information is not intended to replace advice given to you by your health care provider. Make sure you discuss any questions you have with your health care provider.

## 2015-04-18 ENCOUNTER — Encounter (HOSPITAL_COMMUNITY): Payer: Self-pay | Admitting: Internal Medicine

## 2015-04-20 ENCOUNTER — Emergency Department (HOSPITAL_COMMUNITY)
Admission: EM | Admit: 2015-04-20 | Discharge: 2015-04-20 | Disposition: A | Discharge status: Home / Self Care | Payer: 59 | Attending: Emergency Medicine | Admitting: Emergency Medicine

## 2015-04-20 ENCOUNTER — Emergency Department (HOSPITAL_COMMUNITY): Payer: 59

## 2015-04-20 ENCOUNTER — Encounter (HOSPITAL_COMMUNITY): Payer: Self-pay | Admitting: Emergency Medicine

## 2015-04-20 DIAGNOSIS — H9201 Otalgia, right ear: Secondary | ICD-10-CM | POA: Diagnosis not present

## 2015-04-20 DIAGNOSIS — G8929 Other chronic pain: Secondary | ICD-10-CM | POA: Diagnosis not present

## 2015-04-20 DIAGNOSIS — Z79899 Other long term (current) drug therapy: Secondary | ICD-10-CM | POA: Insufficient documentation

## 2015-04-20 DIAGNOSIS — R109 Unspecified abdominal pain: Secondary | ICD-10-CM | POA: Diagnosis not present

## 2015-04-20 DIAGNOSIS — Z9981 Dependence on supplemental oxygen: Secondary | ICD-10-CM | POA: Insufficient documentation

## 2015-04-20 DIAGNOSIS — Z87738 Personal history of other specified (corrected) congenital malformations of digestive system: Secondary | ICD-10-CM | POA: Diagnosis not present

## 2015-04-20 DIAGNOSIS — K5900 Constipation, unspecified: Secondary | ICD-10-CM | POA: Insufficient documentation

## 2015-04-20 DIAGNOSIS — R079 Chest pain, unspecified: Secondary | ICD-10-CM | POA: Diagnosis not present

## 2015-04-20 DIAGNOSIS — H538 Other visual disturbances: Secondary | ICD-10-CM | POA: Insufficient documentation

## 2015-04-20 DIAGNOSIS — F419 Anxiety disorder, unspecified: Secondary | ICD-10-CM | POA: Insufficient documentation

## 2015-04-20 DIAGNOSIS — Z7982 Long term (current) use of aspirin: Secondary | ICD-10-CM | POA: Diagnosis not present

## 2015-04-20 DIAGNOSIS — R51 Headache: Secondary | ICD-10-CM | POA: Diagnosis not present

## 2015-04-20 DIAGNOSIS — G4730 Sleep apnea, unspecified: Secondary | ICD-10-CM | POA: Diagnosis not present

## 2015-04-20 DIAGNOSIS — Z9889 Other specified postprocedural states: Secondary | ICD-10-CM | POA: Diagnosis not present

## 2015-04-20 DIAGNOSIS — E119 Type 2 diabetes mellitus without complications: Secondary | ICD-10-CM | POA: Insufficient documentation

## 2015-04-20 DIAGNOSIS — Z90710 Acquired absence of both cervix and uterus: Secondary | ICD-10-CM | POA: Diagnosis not present

## 2015-04-20 DIAGNOSIS — K219 Gastro-esophageal reflux disease without esophagitis: Secondary | ICD-10-CM | POA: Diagnosis not present

## 2015-04-20 DIAGNOSIS — Z87891 Personal history of nicotine dependence: Secondary | ICD-10-CM | POA: Diagnosis not present

## 2015-04-20 DIAGNOSIS — M1990 Unspecified osteoarthritis, unspecified site: Secondary | ICD-10-CM | POA: Diagnosis not present

## 2015-04-20 DIAGNOSIS — E785 Hyperlipidemia, unspecified: Secondary | ICD-10-CM | POA: Diagnosis not present

## 2015-04-20 DIAGNOSIS — I10 Essential (primary) hypertension: Secondary | ICD-10-CM | POA: Insufficient documentation

## 2015-04-20 LAB — BASIC METABOLIC PANEL
Anion gap: 9 (ref 5–15)
BUN: 12 mg/dL (ref 6–20)
CO2: 27 mmol/L (ref 22–32)
CREATININE: 0.96 mg/dL (ref 0.44–1.00)
Calcium: 9.4 mg/dL (ref 8.9–10.3)
Chloride: 103 mmol/L (ref 101–111)
GFR calc Af Amer: >60 mL/min (ref >60)
Glucose, Bld: 110 mg/dL — ABNORMAL HIGH (ref 65–99)
Potassium: 3.6 mmol/L (ref 3.5–5.1)
SODIUM: 139 mmol/L (ref 135–145)

## 2015-04-20 LAB — CBC
HCT: 37.5 % (ref 36.0–46.0)
HEMOGLOBIN: 11.8 g/dL — AB (ref 12.0–15.0)
MCH: 28.4 pg (ref 26.0–34.0)
MCHC: 31.5 g/dL (ref 30.0–36.0)
MCV: 90.1 fL (ref 78.0–100.0)
Platelets: 386 10*3/uL (ref 150–400)
RBC: 4.16 MIL/uL (ref 3.87–5.11)
RDW: 12.9 % (ref 11.5–15.5)
WBC: 8.1 10*3/uL (ref 4.0–10.5)

## 2015-04-20 MED ORDER — RANITIDINE HCL 150 MG/10ML PO SYRP
300.0000 mg | ORAL_SOLUTION | Freq: Once | ORAL | Status: AC
  Administered 2015-04-20: 300 mg via ORAL
  Filled 2015-04-20: qty 20

## 2015-04-20 MED ORDER — ALUM & MAG HYDROXIDE-SIMETH 200-200-20 MG/5ML PO SUSP
30.0000 mL | Freq: Once | ORAL | Status: AC
  Administered 2015-04-20: 30 mL via ORAL
  Filled 2015-04-20: qty 30

## 2015-04-20 MED ORDER — METOCLOPRAMIDE HCL 10 MG PO TABS
10.0000 mg | ORAL_TABLET | Freq: Once | ORAL | Status: AC
  Administered 2015-04-20: 10 mg via ORAL
  Filled 2015-04-20: qty 1

## 2015-04-20 MED ORDER — SUCRALFATE 1 GM/10ML PO SUSP
1.0000 g | Freq: Once | ORAL | Status: AC
  Administered 2015-04-20: 1 g via ORAL
  Filled 2015-04-20: qty 10

## 2015-04-20 NOTE — ED Notes (Signed)
Pt to xray

## 2015-04-20 NOTE — ED Notes (Addendum)
Pt reports that chest pain, head ache, throat burns for a few weeks.  She reports that "feels like I'm being poisoned."  Reports that regular provider "could not figure out what is going on."  Reports that she had a full cardiac workup and everything was great.   Reports that she feels sick to her stomach.  Pt reports that she is concerned about black mold she has in her apartment.

## 2015-04-20 NOTE — ED Notes (Signed)
Patient transported to X-ray 

## 2015-04-20 NOTE — ED Provider Notes (Signed)
CSN: [id number]     Arrival date & time 04/20/15  2010 History  This chart was scribed for Everlene Balls, MD by Chester Holstein, ED Scribe. This patient was seen in room A01C/A01C and the patient's care was started at 3:44 AM.    Chief Complaint  Patient presents with  . Chest Pain     The history is provided by the patient. No language interpreter was used.   HPI Comments: Robin [name] is a 59 y.o. female with PMHx of DM, GERD, HTN, HLD, schatzki's ring, who presents to the Emergency Department complaining of constant burning central chest pain with onset a few weeks ago. Pt notes associated right sided headache, burning throat, early satiety, burning abdominal pain, right eye pain with blurred vision, and right ear pain. Pt reports building where she lives has mold. She denies changes in symptoms with food intake. She states pain feels similar to reflux yet different. Pt has spoken with PCP who referred pt to a neurologist. Pt has been taking Dexilant for relief. Pt had a esophagogastroduodenoscopy on 04/17/2015 with impression of "She could have gastroparesis. Other diagnostic possibilities exist." She denies any other complaints.   Past Medical History  Diagnosis Date  . GERD (gastroesophageal reflux disease)   . Constipation   . Anxiety disorder   . Hyperlipidemia   . Hypertension   . Chronic neck pain   . Schatzki's ring     non critical  . Hiatal hernia     small  . Sleep apnea     CPAP, Sleep study at [facility]  . Arthritis   . Diabetes mellitus without complication    Past Surgical History  Procedure Laterality Date  . Knee arthroscopy  04/27/2001  . Cardiac catheterization  08/17/2001    normal L main/LAD/L Cfx/RCA (Dr. Adora Fridge)  . Vaginal prolapse repair    . Rectocele repair    . Esophagogastroduodenoscopy  08/23/2008    OFH:QRFX distal esophageal erosions consistent with mild erosive reflux esophagitis, otherwise unremarkable esophagus/ Tiny antral  erosions of doubtful clinical significance, otherwise normal stomach, patent pylorus, normal D1 and D2  . Colonoscopy  2006    Dr. [name], hyperplastic polyps  . Colonoscopy  08/06/11    abnormal terminal ileum for 10cm, erosions, geographical ulceration. Bx small bowel mucosa with prominent intramucosal lymphoid aggregates, slightly inflammed  . Esophagogastroduodenoscopy  08/06/11    small hh, noncritical Schatzki's ring s/p 79 F  . Abdominal hysterectomy    . Direct laryngoscopy  03/15/2012    Procedure: DIRECT LARYNGOSCOPY;  Surgeon: [name], MD;  Location: [facility];  Service: ENT;  Laterality: N/A; Dr. Wolicki--:>no foreign body seen. normal esophagus to 40cm  . Esophagoscopy  03/15/2012    Procedure: ESOPHAGOSCOPY;  Surgeon: [name], MD;  Location: [facility] OR;  Service: ENT;  Laterality: N/A;  . 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 Abdominal hysterectomy  02/18/2000  . Transthoracic echocardiogram  2003    EF normal  . Nm myocar perf wall motion  2003    negative bruce protocol exercise tress test; EF 68%; intermediate risk study due to evidence of anterior wall ischemia extending from mid-ventricle to apex  . Esophagogastroduodenoscopy N/A 04/17/2015    Procedure: ESOPHAGOGASTRODUODENOSCOPY (EGD);  Surgeon: [name], MD;  [facility];  Service: Endoscopy;  Laterality: N/A;  915  . Esophageal dilation  04/17/2015    Procedure: ESOPHAGEAL DILATION;  Surgeon: [name], MD;  Location: [facility];  Service: Endoscopy;;   Family History  Problem Relation Age of Onset  . Kidney cancer Father   . Stomach cancer      aunt  . Breast cancer Maternal Grandmother   . Colon cancer Neg Hx   . Diabetes Mother   . Hypertension Mother   . Heart Problems Mother   . Hyperlipidemia Mother   . Cancer Mother   . Hypertension Father   . Hyperlipidemia Father   . Cancer Father   . Kidney disease Maternal Grandfather   . Breast cancer  Paternal Grandmother   . Hypertension Brother   . Hyperlipidemia Brother   . Kidney disease Brother   . Hypertension Brother   . Hypertension Sister   . Hypertension Sister   . Hyperlipidemia Sister    History  Substance Use Topics  . Smoking status: Former Smoker -- 0.50 packs/day    Types: Cigarettes    Quit date: 04/04/1993  . Smokeless tobacco: Not on file     Comment: 17 yrs ago  . Alcohol Use: No   OB History    No data available     Review of Systems  Cardiovascular: Positive for chest pain.  A complete 10 system review of systems was obtained and all systems are negative except as noted in the HPI and PMH.      Allergies  Review of patient's allergies indicates no known allergies.  Home Medications   Prior to Admission medications   Medication Sig Start Date End Date Taking? Authorizing Provider  amitriptyline (ELAVIL) 25 MG tablet Take 1 tablet (25 mg total) by mouth at bedtime. For 2 weeks. Patient not taking: Reported on 04/05/2015 02/17/15   Melony Overly, MD  aspirin 81 MG tablet Take 81 mg by mouth at bedtime.     Historical Provider, MD  buPROPion (WELLBUTRIN XL) 300 MG 24 hr tablet Take 300 mg by mouth every morning.  09/08/11   Historical Provider, MD  celecoxib (CELEBREX) 200 MG capsule Take 200 mg by mouth 2 (two) times daily as needed for mild pain.  03/14/15   Historical Provider, MD  co-enzyme Q-10 30 MG capsule Take 30 mg by mouth daily.     Historical Provider, MD  colesevelam (WELCHOL) 625 MG tablet Take 1,875 mg by mouth 2 (two) times daily with a meal.     Historical Provider, MD  dexlansoprazole (DEXILANT) 60 MG capsule Take 1 capsule (60 mg total) by mouth daily. 04/03/15   [name], MD  ferrous sulfate 325 (65 FE) MG tablet Take 1 tablet (325 mg total) by mouth 2 (two) times daily with a meal. 03/05/15   Belkys A Regalado, MD  fexofenadine (ALLEGRA) 180 MG tablet Take 180 mg by mouth daily.    Historical Provider, MD  gabapentin (NEURONTIN) 300  MG capsule Take 300 mg by mouth 2 (two) times daily.  02/11/12   Historical Provider, MD  HYDROcodone-acetaminophen (NORCO/VICODIN) 5-325 MG per tablet Take 1 tablet by mouth every 6 (six) hours as needed. For pain 02/07/15   Historical Provider, MD  levothyroxine (SYNTHROID, LEVOTHROID) 88 MCG tablet Take 88 mcg by mouth daily. 03/06/15   Historical Provider, MD  Linaclotide Rolan Lipa) 290 MCG CAPS capsule Take 1 capsule (290 mcg total) by mouth daily. 04/03/15   [name], MD  losartan-hydrochlorothiazide (HYZAAR) 100-25 MG per tablet Take 1 tablet by mouth daily.    Historical Provider, MD  metFORMIN (  GLUCOPHAGE) 500 MG tablet Take 500 mg by mouth daily. 03/06/15   Historical Provider, MD  Multiple Vitamin (MULTIVITAMIN) tablet Take 1 tablet by mouth daily.      Historical Provider, MD  Omega-3 Fatty Acids (FISH OIL PO) Take 1 capsule by mouth at bedtime.    Historical Provider, MD  traMADol (ULTRAM) 50 MG tablet Take 50 mg by mouth every 6 (six) hours as needed for moderate pain.     Historical Provider, MD   BP 150/69 mmHg  Pulse 56  Temp(Src) 98.4 F (36.9 C) (Oral)  Resp 14  Ht 5\' 8"  (1.727 m)  Wt 270 lb (122.471 kg)  BMI 41.06 kg/m2  SpO2 100% Physical Exam  Constitutional: She is oriented to person, place, and time. She appears well-developed and well-nourished. No distress.  HENT:  Head: Normocephalic and atraumatic.  Nose: Nose normal.  Mouth/Throat: Oropharynx is clear and moist. No oropharyngeal exudate.  Eyes: Conjunctivae and EOM are normal. Pupils are equal, round, and reactive to light. No scleral icterus.  Neck: Normal range of motion. Neck supple. No JVD present. No tracheal deviation present. No thyromegaly present.  Cardiovascular: Normal rate, regular rhythm and normal heart sounds.  Exam reveals no gallop and no friction rub.   No murmur heard. Pulmonary/Chest: Effort normal and breath sounds normal. No respiratory distress. She has no wheezes. She exhibits no  tenderness.  Abdominal: Soft. Bowel sounds are normal. She exhibits no distension and no mass. There is no tenderness. There is no rebound and no guarding.  Musculoskeletal: Normal range of motion. She exhibits no edema or tenderness.  Lymphadenopathy:    She has no cervical adenopathy.  Neurological: She is alert and oriented to person, place, and time. No cranial nerve deficit. She exhibits normal muscle tone.  Skin: Skin is warm and dry. No rash noted. No erythema. No pallor.  Nursing note and vitals reviewed.   ED Course  Procedures (including critical care time) DIAGNOSTIC STUDIES: Oxygen Saturation is 100% on room air, normal by my interpretation.    COORDINATION OF CARE: 3:51 AM Discussed treatment plan with patient at beside, the patient agrees with the plan and has no further questions at this time.   Labs Review Labs Reviewed  BASIC METABOLIC PANEL - Abnormal; Notable for the following:    Glucose, Bld 110 (*)    All other components within normal limits  CBC - Abnormal; Notable for the following:    Hemoglobin 11.8 (*)    All other components within normal limits    Imaging Review Dg Chest 2 View  04/20/2015   CLINICAL DATA:  Chest pain and burning sensation from the throat to the abdomen for 1 week. Exposure to black mold.  EXAM: CHEST  2 VIEW  COMPARISON:  03/04/2015  FINDINGS: The heart size and mediastinal contours are within normal limits. Both lungs are clear. The visualized skeletal structures are unremarkable.  IMPRESSION: No active cardiopulmonary disease.   Electronically Signed   By: Lucienne Capers M.D.   On: 04/20/2015 04:19   US Abdomen Limited Ruq  04/20/2015   CLINICAL DATA:  Right upper quadrant pain increasingly worsening over the last month.  EXAM: US ABDOMEN LIMITED - RIGHT UPPER QUADRANT  COMPARISON:  None.  FINDINGS: Gallbladder:  Stone in the dependent portion of the gallbladder. Measures 1.7 cm diameter. No gallbladder wall thickening. Murphy's  sign is negative.  Common bile duct:  Diameter: 3.4 mm, normal. Distal common bile duct is not visualized.  Liver:  Increased parenchymal echotexture suggesting fatty infiltration. No focal lesions identified. Limited segmental visualization of the liver due to body habitus.  IMPRESSION: Cholelithiasis without evidence of cholecystitis.   Electronically Signed   By: Lucienne Capers M.D.   On: 04/20/2015 04:37     EKG Interpretation   Date/Time:  Saturday April 20 2015 03:31:22 EDT Ventricular Rate:  56 PR Interval:  188 QRS Duration: 81 QT Interval:  441 QTC Calculation: 426 R Axis:   70 Text Interpretation:  Sinus rhythm No significant change since last  tracing Confirmed by Glynn Octave [phone]) on 04/20/2015 4:09:21 AM      MDM   Final diagnoses:  None    Patient presents emergency department for burning of her throat, chest, and abdomen. This is going on for approximately one month. She has had multiple evaluations for this including seeing her primary care physician, EGD by gastroenterology, and she has a neurology appointment coming up regarding her headache. Nothing has worked for her. Her recent EGD showed esophagitis only and her doctor recommended follow-up ultrasound. Right upper quadrant ultrasound was obtained tonight and shows cholelithiasis without any evidence of cholecystitis. I gave the patient Maalox, sucralfate, ranitidine without any relief. She states the burning is the exact same. I'm unsure what is causing this but do not believe it is an acute emergency medical condition. Patient will be discharged with primary care follow-up within the next 3 days. She is advised to continue medications as prescribed by her regular doctors. She overall appears well in the room and in no acute distress. She is rested comfortably in the emergency department throughout the night. Her vital signs remain within her normal limits and she is safe for discharge.   I personally  performed the services described in this documentation, which was scribed in my presence. The recorded information has been reviewed and is accurate.    Everlene Balls, MD 04/20/15 [phone]

## 2015-04-20 NOTE — Discharge Instructions (Signed)
Abdominal Pain, Women Robin [name], your ultrasound shows gallstones without any infection. See your primary care physician within 3 days for close follow-up. If symptoms worsen come back to emergency department immediately. Thank you. Abdominal (stomach, pelvic, or belly) pain can be caused by many things. It is important to tell your doctor:  The location of the pain.  Does it come and go or is it present all the time?  Are there things that start the pain (eating certain foods, exercise)?  Are there other symptoms associated with the pain (fever, nausea, vomiting, diarrhea)? All of this is helpful to know when trying to find the cause of the pain. CAUSES   Stomach: virus or bacteria infection, or ulcer.  Intestine: appendicitis (inflamed appendix), regional ileitis (Crohn's disease), ulcerative colitis (inflamed colon), irritable bowel syndrome, diverticulitis (inflamed diverticulum of the colon), or cancer of the stomach or intestine.  Gallbladder disease or stones in the gallbladder.  Kidney disease, kidney stones, or infection.  Pancreas infection or cancer.  Fibromyalgia (pain disorder).  Diseases of the female organs:  Uterus: fibroid (non-cancerous) tumors or infection.  Fallopian tubes: infection or tubal pregnancy.  Ovary: cysts or tumors.  Pelvic adhesions (scar tissue).  Endometriosis (uterus lining tissue growing in the pelvis and on the pelvic organs).  Pelvic congestion syndrome (female organs filling up with blood just before the menstrual period).  Pain with the menstrual period.  Pain with ovulation (producing an egg).  Pain with an IUD (intrauterine device, birth control) in the uterus.  Cancer of the female organs.  Functional pain (pain not caused by a disease, may improve without treatment).  Psychological pain.  Depression. DIAGNOSIS  Your doctor will decide the seriousness of your pain by doing an examination.  Blood  tests.  X-rays.  Ultrasound.  CT scan (computed tomography, special type of X-ray).  MRI (magnetic resonance imaging).  Cultures, for infection.  Barium enema (dye inserted in the large intestine, to better view it with X-rays).  Colonoscopy (looking in intestine with a lighted tube).  Laparoscopy (minor surgery, looking in abdomen with a lighted tube).  Major abdominal exploratory surgery (looking in abdomen with a large incision). TREATMENT  The treatment will depend on the cause of the pain.   Many cases can be observed and treated at home.  Over-the-counter medicines recommended by your caregiver.  Prescription medicine.  Antibiotics, for infection.  Birth control pills, for painful periods or for ovulation pain.  Hormone treatment, for endometriosis.  Nerve blocking injections.  Physical therapy.  Antidepressants.  Counseling with a psychologist or psychiatrist.  Minor or major surgery. HOME CARE INSTRUCTIONS   Do not take laxatives, unless directed by your caregiver.  Take over-the-counter pain medicine only if ordered by your caregiver. Do not take aspirin because it can cause an upset stomach or bleeding.  Try a clear liquid diet (broth or water) as ordered by your caregiver. Slowly move to a bland diet, as tolerated, if the pain is related to the stomach or intestine.  Have a thermometer and take your temperature several times a day, and record it.  Bed rest and sleep, if it helps the pain.  Avoid sexual intercourse, if it causes pain.  Avoid stressful situations.  Keep your follow-up appointments and tests, as your caregiver orders.  If the pain does not go away with medicine or surgery, you may try:  Acupuncture.  Relaxation exercises (yoga, meditation).  Group therapy.  Counseling. SEEK MEDICAL CARE IF:   You notice certain  foods cause stomach pain.  Your home care treatment is not helping your pain.  You need stronger pain  medicine.  You want your IUD removed.  You feel faint or lightheaded.  You develop nausea and vomiting.  You develop a rash.  You are having side effects or an allergy to your medicine. SEEK IMMEDIATE MEDICAL CARE IF:   Your pain does not go away or gets worse.  You have a fever.  Your pain is felt only in portions of the abdomen. The right side could possibly be appendicitis. The left lower portion of the abdomen could be colitis or diverticulitis.  You are passing blood in your stools (bright red or black tarry stools, with or without vomiting).  You have blood in your urine.  You develop chills, with or without a fever.  You pass out. MAKE SURE YOU:   Understand these instructions.  Will watch your condition.  Will get help right away if you are not doing well or get worse. Document Released: 07/19/2007 Document Revised: 02/05/2014 Document Reviewed: 08/08/2009 [facility] Patient Information 2015 [place], [place]. This information is not intended to replace advice given to you by your health care provider. Make sure you discuss any questions you have with your health care provider. Chest Pain (Nonspecific) It is often hard to give a diagnosis for the cause of chest pain. There is always a chance that your pain could be related to something serious, such as a heart attack or a blood clot in the lungs. You need to follow up with your doctor. HOME CARE  If antibiotic medicine was given, take it as directed by your doctor. Finish the medicine even if you start to feel better.  For the next few days, avoid activities that bring on chest pain. Continue physical activities as told by your doctor.  Do not use any tobacco products. This includes cigarettes, chewing tobacco, and e-cigarettes.  Avoid drinking alcohol.  Only take medicine as told by your doctor.  Follow your doctor's suggestions for more testing if your chest pain does not go away.  Keep all doctor visits you  made. GET HELP IF:  Your chest pain does not go away, even after treatment.  You have a rash with blisters on your chest.  You have a fever. GET HELP RIGHT AWAY IF:   You have more pain or pain that spreads to your arm, neck, jaw, back, or belly (abdomen).  You have shortness of breath.  You cough more than usual or cough up blood.  You have very bad back or belly pain.  You feel sick to your stomach (nauseous) or throw up (vomit).  You have very bad weakness.  You pass out (faint).  You have chills. This is an emergency. Do not wait to see if the problems will go away. Call your local emergency services (911 in [place] Do not drive yourself to the hospital. MAKE SURE YOU:   Understand these instructions.  Will watch your condition.  Will get help right away if you are not doing well or get worse. Document Released: 03/09/2008 Document Revised: 09/26/2013 Document Reviewed: 03/09/2008 [facility] Patient Information 2015 [place], [place]. This information is not intended to replace advice given to you by your health care provider. Make sure you discuss any questions you have with your health care provider.

## 2015-04-22 ENCOUNTER — Encounter: Payer: Self-pay | Admitting: Cardiovascular Disease

## 2015-05-08 ENCOUNTER — Ambulatory Visit: Payer: 59 | Admitting: Cardiovascular Disease

## 2015-05-13 ENCOUNTER — Telehealth: Payer: Self-pay

## 2015-05-13 NOTE — Telephone Encounter (Signed)
Pt aware.

## 2015-05-13 NOTE — Telephone Encounter (Signed)
Pt called and states that when she seen Dr. [name] he told her that she needed to have an [name] and that she did have it and that is did show she has gallstones. Pt is set for FU OV on 08/22. Wants to know if she needs to be seen before then due to the stones. Denies any pain. Wants to know what her next step is

## 2015-05-13 NOTE — Telephone Encounter (Signed)
F/Y 05/27/15 OK.

## 2015-05-14 ENCOUNTER — Ambulatory Visit: Payer: 59 | Admitting: Cardiovascular Disease

## 2015-05-27 ENCOUNTER — Encounter: Payer: Self-pay | Admitting: Gastroenterology

## 2015-05-27 ENCOUNTER — Ambulatory Visit (INDEPENDENT_AMBULATORY_CARE_PROVIDER_SITE_OTHER): Payer: 59 | Admitting: Gastroenterology

## 2015-05-27 VITALS — BP 146/85 | HR 71 | Temp 98.1°F | Ht 68.0 in | Wt 272.8 lb

## 2015-05-27 DIAGNOSIS — R1013 Epigastric pain: Secondary | ICD-10-CM

## 2015-05-27 DIAGNOSIS — K219 Gastro-esophageal reflux disease without esophagitis: Secondary | ICD-10-CM | POA: Diagnosis not present

## 2015-05-27 DIAGNOSIS — K5900 Constipation, unspecified: Secondary | ICD-10-CM | POA: Diagnosis not present

## 2015-05-27 DIAGNOSIS — K760 Fatty (change of) liver, not elsewhere classified: Secondary | ICD-10-CM | POA: Insufficient documentation

## 2015-05-27 LAB — HEPATIC FUNCTION PANEL
ALBUMIN: 4 g/dL (ref 3.6–5.1)
ALT: 17 U/L (ref 6–29)
AST: 18 U/L (ref 10–35)
Alkaline Phosphatase: 64 U/L (ref 33–130)
Bilirubin, Direct: 0.1 mg/dL (ref <=0.2)
Indirect Bilirubin: 0.4 mg/dL (ref 0.2–1.2)
Total Bilirubin: 0.5 mg/dL (ref 0.2–1.2)
Total Protein: 7.1 g/dL (ref 6.1–8.1)

## 2015-05-27 MED ORDER — RANITIDINE HCL 150 MG PO TABS: 150.0000 mg | ORAL_TABLET | Freq: Every day | ORAL | Status: DC

## 2015-05-27 MED ORDER — LUBIPROSTONE 24 MCG PO CAPS: 24.0000 ug | ORAL_CAPSULE | Freq: Two times a day (BID) | ORAL | Status: DC

## 2015-05-27 NOTE — Progress Notes (Signed)
Primary Care Physician: Purvis Kilts, MD  Primary Gastroenterologist:  Garfield Cornea, MD   Chief Complaint  Patient presents with  . Follow-up    not having as good BM's    HPI: Robin [name] is a 59 y.o. female here for follow-up. She was seen back in June by Dr. [name]. Underwent an EGD which was normal. Empirical esophageal dilation was performed. Subsequent abdominal ultrasound showed cholelithiasis and fatty liver.  Last couple of weeks, BM not as loose and complete. Still going daily but feels like not enough. Tramadol the most recent addition. No melena, brbpr. Feels full all the time. Early satiety, some nausea. No vomiting. Worse with constipation. moderate in severity. Worse couple of weeks. More of postprandial symptoms after dinner. Occasional ruq pain pp. More burning in esophagus lately with constipation.     Current Outpatient Prescriptions  Medication Sig Dispense Refill  . aspirin 81 MG tablet Take 81 mg by mouth at bedtime.     Marland Kitchen buPROPion (WELLBUTRIN XL) 300 MG 24 hr tablet Take 300 mg by mouth every morning.     . celecoxib (CELEBREX) 200 MG capsule Take 200 mg by mouth 2 (two) times daily as needed for mild pain.   1  . colesevelam (WELCHOL) 625 MG tablet Take 1,875 mg by mouth 2 (two) times daily with a meal.     . dexlansoprazole (DEXILANT) 60 MG capsule Take 1 capsule (60 mg total) by mouth daily. 30 capsule 11  . ferrous sulfate 325 (65 FE) MG tablet Take 1 tablet (325 mg total) by mouth 2 (two) times daily with a meal. 30 tablet 0  . fexofenadine (ALLEGRA) 180 MG tablet Take 180 mg by mouth daily.    Marland Kitchen gabapentin (NEURONTIN) 300 MG capsule Take 300 mg by mouth 2 (two) times daily. Pt said dosage had increased and she is not sure how much but she takes twice daily    . HYDROcodone-acetaminophen (NORCO/VICODIN) 5-325 MG per tablet Take 1 tablet by mouth every 6 (six) hours as needed. For pain  0  . levothyroxine (SYNTHROID, LEVOTHROID) 88 MCG  tablet Take 88 mcg by mouth daily.  2  . Linaclotide (LINZESS) 290 MCG CAPS capsule Take 1 capsule (290 mcg total) by mouth daily. 30 capsule 11  . losartan-hydrochlorothiazide (HYZAAR) 100-25 MG per tablet Take 1 tablet by mouth daily.    . metFORMIN (GLUCOPHAGE) 500 MG tablet Take 500 mg by mouth daily.  3  . Multiple Vitamin (MULTIVITAMIN) tablet Take 1 tablet by mouth daily.      . Omega-3 Fatty Acids (FISH OIL PO) Take 1 capsule by mouth at bedtime.    . traMADol (ULTRAM) 50 MG tablet Take 50 mg by mouth every 6 (six) hours as needed for moderate pain.      No current facility-administered medications for this visit.    Allergies as of 05/27/2015  . (No Known Allergies)   Past Surgical History  Procedure Laterality Date  . Knee arthroscopy  04/27/2001  . Cardiac catheterization  08/17/2001    normal L main/LAD/L Cfx/RCA (Dr. Adora Fridge)  . Vaginal prolapse repair    . Rectocele repair    . Esophagogastroduodenoscopy  08/23/2008    NKN:LZJQ distal esophageal erosions consistent with mild erosive reflux esophagitis, otherwise unremarkable esophagus/ Tiny antral erosions of doubtful clinical significance, otherwise normal stomach, patent pylorus, normal D1 and D2  . Colonoscopy  2006    Dr. [name], hyperplastic polyps  .  Colonoscopy  08/06/11    abnormal terminal ileum for 10cm, erosions, geographical ulceration. Bx small bowel mucosa with prominent intramucosal lymphoid aggregates, slightly inflammed  . Esophagogastroduodenoscopy  08/06/11    small hh, noncritical Schatzki's ring s/p 19 F  . Abdominal hysterectomy    . Direct laryngoscopy  03/15/2012    Procedure: DIRECT LARYNGOSCOPY;  Surgeon: [name], MD;  Location: [facility];  Service: ENT;  Laterality: N/A; Dr. Wolicki--:>no foreign body seen. normal esophagus to 40cm  . Esophagoscopy  03/15/2012    Procedure: ESOPHAGOSCOPY;  Surgeon: [name], MD;  Location: [facility] OR;  Service: ENT;  Laterality: N/A;  . Pituitary surgery   06/2012    benign tumor, surgeon at [facility]  . Abdominal hysterectomy  02/18/2000  . Transthoracic echocardiogram  2003    EF normal  . Nm myocar perf wall motion  2003    negative bruce protocol exercise tress test; EF 68%; intermediate risk study due to evidence of anterior wall ischemia extending from mid-ventricle to apex  . Esophagogastroduodenoscopy N/A 04/17/2015    BJS:EGBTDV s/p dilation  . Esophageal dilation  04/17/2015    Procedure: ESOPHAGEAL DILATION;  Surgeon: [name], MD;  [facility];  Service: Endoscopy;;   Past Medical History  Diagnosis Date  . GERD (gastroesophageal reflux disease)   . Constipation   . Anxiety disorder   . Hyperlipidemia   . Hypertension   . Chronic neck pain   . Schatzki's ring     non critical  . Hiatal hernia     small  . Sleep apnea     CPAP, Sleep study at [place]  . Arthritis   . Diabetes mellitus without complication     ROS:  General: Negative for anorexia, weight loss, fever, chills, fatigue, weakness. ENT: Negative for hoarseness, difficulty swallowing , nasal congestion. CV: Negative for chest pain, angina, palpitations, dyspnea on exertion, peripheral edema.  Respiratory: Negative for dyspnea at rest, dyspnea on exertion, cough, sputum, wheezing.  GI: See history of present illness. GU:  Negative for dysuria, hematuria, urinary incontinence, urinary frequency, nocturnal urination.  Endo: Negative for unusual weight change.    Physical Examination:   BP 146/85 mmHg  Pulse 71  Temp(Src) 98.1 F (36.7 C)  Ht 5\' 8"  (1.727 m)  Wt 272 lb 12.8 oz (123.741 kg)  BMI 41.49 kg/m2  General: Well-nourished, well-developed in no acute distress.  Eyes: No icterus. Mouth: Oropharyngeal mucosa moist and pink , no lesions erythema or exudate. Lungs: Clear to auscultation bilaterally.  Heart: Regular rate and rhythm, no murmurs rubs or gallops.  Abdomen: Bowel sounds are normal, mild epigastric  tenderness, nondistended, no hepatosplenomegaly or masses, no abdominal bruits or hernia , no rebound or guarding.   Extremities: No lower extremity edema. No clubbing or deformities. Neuro: Alert and oriented x 4   Skin: Warm and dry, no jaundice.   Psych: Alert and cooperative, normal mood and affect.  Labs:  Lab Results  Component Value Date   CREATININE 0.96 04/20/2015   BUN 12 04/20/2015   NA 139 04/20/2015   K 3.6 04/20/2015   CL 103 04/20/2015   CO2 27 04/20/2015   Lab Results  Component Value Date   WBC 8.1 04/20/2015   HGB 11.8* 04/20/2015   HCT 37.5 04/20/2015   MCV 90.1 04/20/2015   PLT 386 04/20/2015    Imaging Studies: See above.  Impression/Plan: 59 y/o female with vague epigastric pain/rua pain, postprandial early  satiety, nausea. Recent EGD unremarkable. U/S with cholelithiasis and fatty liver. Cannot exclude symptoms secondary to gb but early satiety remains possibility. Reflux symptoms worse in setting of worsening constipation.   1. Add Zantac 150mg  BID prn. 2. Change Linzess to Amitiza 78mcg BID with food. 3. Check LFTs given fatty liver and symptoms.  4. Call with progress report in couple of weeks.  5. Patient wants to hold off on surgical evaluation at this time.

## 2015-05-27 NOTE — Patient Instructions (Signed)
1. Stop Linzess. Start Amitiza 41mcg twice daily WITH food. 2. Add Zantac 150mg  at bedtime to help with breakthrough reflux.  3. Continue Dexilant. 4. Call with progress report in a few weeks or sooner if no improvement.  5. Let me know if you decide you want to see a surgeon to discuss gallbladder surgery.  6. Please have labs to check liver since fatty liver showed up on ultrasound.

## 2015-06-04 NOTE — Progress Notes (Signed)
CC'ED TO PCP 

## 2015-06-11 NOTE — Progress Notes (Signed)
Quick Note:  LFTs normal. Repeat in one year due to fatty liver.   How is she doing? ______

## 2015-06-17 ENCOUNTER — Other Ambulatory Visit: Payer: Self-pay | Admitting: Gastroenterology

## 2015-06-17 DIAGNOSIS — K760 Fatty (change of) liver, not elsewhere classified: Secondary | ICD-10-CM

## 2015-07-03 ENCOUNTER — Other Ambulatory Visit: Payer: Self-pay

## 2015-07-03 DIAGNOSIS — K8020 Calculus of gallbladder without cholecystitis without obstruction: Secondary | ICD-10-CM

## 2015-07-03 NOTE — Progress Notes (Signed)
Quick Note:  Noted. OK to refer to Dr. [name] or [facility] for gallbladder/gallstones. ______

## 2015-07-08 ENCOUNTER — Ambulatory Visit: Payer: 59 | Admitting: Neurology

## 2015-07-12 ENCOUNTER — Other Ambulatory Visit: Payer: Self-pay

## 2015-07-12 MED ORDER — METFORMIN HCL 500 MG PO TABS: 500.0000 mg | ORAL_TABLET | Freq: Every day | ORAL | Status: DC

## 2015-08-09 ENCOUNTER — Ambulatory Visit (INDEPENDENT_AMBULATORY_CARE_PROVIDER_SITE_OTHER): Payer: 59 | Admitting: Neurology

## 2015-08-09 ENCOUNTER — Encounter: Payer: Self-pay | Admitting: Neurology

## 2015-08-09 VITALS — BP 144/74 | HR 73 | Ht 68.0 in | Wt 279.0 lb

## 2015-08-09 DIAGNOSIS — R202 Paresthesia of skin: Secondary | ICD-10-CM

## 2015-08-09 DIAGNOSIS — M542 Cervicalgia: Secondary | ICD-10-CM

## 2015-08-09 DIAGNOSIS — D352 Benign neoplasm of pituitary gland: Secondary | ICD-10-CM

## 2015-08-09 DIAGNOSIS — R209 Unspecified disturbances of skin sensation: Secondary | ICD-10-CM

## 2015-08-09 DIAGNOSIS — G43809 Other migraine, not intractable, without status migrainosus: Secondary | ICD-10-CM | POA: Diagnosis not present

## 2015-08-09 DIAGNOSIS — R292 Abnormal reflex: Secondary | ICD-10-CM

## 2015-08-09 NOTE — Patient Instructions (Signed)
1.  MRI brain with and without contrast 2.  MRI cervical spine without contrast 3.  Increase gabapentin as follows to see if it helps with your burning pain:  Gabapentin 600 mg tablets    Morning       Afternoon        Evening  Week 1 1 tab  1 tab         1.5 tab          Week 2 1.5  tab          1 tab         1.5  tab              Continue  1.5  tab          1.5  tab         1.5 tab         Return to clinic in 3 months

## 2015-08-09 NOTE — Progress Notes (Signed)
[facility] Clinic Note - Initial Visit   Date: 08/09/2015  Robin [name] MRN: [id number] DOB: [birth year]   Dear Dr. Hilma Favors:  Thank you for your kind referral of Robin [name] for consultation of right sided paresthesias. Although her history is well known to you, please allow [name] to reiterate it for the purpose of our medical record. The patient was accompanied to the clinic by self.    History of Present Illness: Robin [name] is a 59 y.o. right-handed African American female with depression, history of pituitary adenoma s/p resection,  diabetes mellitus, GERD, hypertension, hyperlipidemia, chronic pain, and fibromyalgia presenting for evaluation of right sided paresthesias.    Starting in 2013, she began experiencing spells of numbness involving face, arm, and leg on the right side.  These occur about twice per week, lasting 2-3 minutes.  She endorses significant stress and notices that it is worse when she is anxious.  If she relaxes, symptoms generally improved.  She has a lot of stress over the past year related to family health - mother almost died, husband has kidney cancer, and she was diagnosed with arthritis and cannot stay active.    She also complains of burning sensation of various parts of the scalp, lasting minutes to hours. She then develops a achy headaches, mostly on the left side.  Denies associated photophobia, phonophobia, nausea/vomiting.  Her sister has migraines.  She takes a lot of tylenol for arthritis.   She was previously followed by Dr. Merlene Laughter who had found a pituitary tumor and she has since had resection.  She has not had any interval imaging of the brain since 2013.  Out-side paper records, electronic medical record, and images have been reviewed where available and summarized as:  MRI brain wwo contrast 03/05/2012: 1. 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 Mild effacement of the suprasellar cistern and subtle mass  effect on the optic chiasm. No invasion of the cavernous sinus. 2. Otherwise stable and negative MRI appearance of the brain.  Past Medical History  Diagnosis Date  . GERD (gastroesophageal reflux disease)   . Constipation   . Anxiety disorder   . Hyperlipidemia   . Hypertension   . Chronic neck pain   . Schatzki's ring     non critical  . Hiatal hernia     small  . Sleep apnea     CPAP, Sleep study at [place]  . Arthritis   . Diabetes mellitus without complication [facility])     Past Surgical History  Procedure Laterality Date  . Knee arthroscopy  04/27/2001  . Cardiac catheterization  08/17/2001    normal L main/LAD/L Cfx/RCA (Dr. Adora Fridge)  . Vaginal prolapse repair    . Rectocele repair    . Esophagogastroduodenoscopy  08/23/2008    KAJ:GOTL distal esophageal erosions consistent with mild erosive reflux esophagitis, otherwise unremarkable esophagus/ Tiny antral erosions of doubtful clinical significance, otherwise normal stomach, patent pylorus, normal D1 and D2  . Colonoscopy  2006    Dr. [name], hyperplastic polyps  . Colonoscopy  08/06/11    abnormal terminal ileum for 10cm, erosions, geographical ulceration. Bx small bowel mucosa with prominent intramucosal lymphoid aggregates, slightly inflammed  . Esophagogastroduodenoscopy  08/06/11    small hh, noncritical Schatzki's ring s/p 39 F  . Abdominal hysterectomy    . Direct laryngoscopy  03/15/2012    Procedure: DIRECT LARYNGOSCOPY;  Surgeon: [name], MD;  Location: [place];  Service: ENT;  Laterality: N/A; Dr. Wolicki--:>no foreign body seen. normal esophagus to 40cm  . Esophagoscopy  03/15/2012    Procedure: ESOPHAGOSCOPY;  Surgeon: [name], MD;  Location: [facility] OR;  Service: ENT;  Laterality: N/A;  . Pituitary surgery  06/2012    benign tumor, surgeon at [facility]  . Abdominal hysterectomy  02/18/2000  . Transthoracic echocardiogram  2003    EF normal  . Nm myocar perf wall motion  2003     negative bruce protocol exercise tress test; EF 68%; intermediate risk study due to evidence of anterior wall ischemia extending from mid-ventricle to apex  . Esophagogastroduodenoscopy N/A 04/17/2015    ZJQ:BHALPF s/p dilation  . Esophageal dilation  04/17/2015    Procedure: ESOPHAGEAL DILATION;  Surgeon: [name], MD;  [facility];  Service: Endoscopy;;     Medications:  Outpatient Encounter Prescriptions as of 08/09/2015  Medication Sig Note  . aspirin 81 MG tablet Take 81 mg by mouth at bedtime.    Marland Kitchen buPROPion (WELLBUTRIN XL) 300 MG 24 hr tablet Take 300 mg by mouth every morning.    . celecoxib (CELEBREX) 200 MG capsule Take 200 mg by mouth 2 (two) times daily as needed for mild pain.    . colesevelam (WELCHOL) 625 MG tablet Take 1,875 mg by mouth 2 (two) times daily with a meal.    . dexlansoprazole (DEXILANT) 60 MG capsule Take 1 capsule (60 mg total) by mouth daily.   . ferrous sulfate 325 (65 FE) MG tablet Take 1 tablet (325 mg total) by mouth 2 (two) times daily with a meal.   . fexofenadine (ALLEGRA) 180 MG tablet Take 180 mg by mouth daily.   Marland Kitchen gabapentin (NEURONTIN) 300 MG capsule Take 600 mg by mouth 2 (two) times daily. Pt said dosage had increased and she is not sure how much but she takes twice daily   . levothyroxine (SYNTHROID, LEVOTHROID) 88 MCG tablet Take 88 mcg by mouth daily. 04/20/2015: .....  . losartan-hydrochlorothiazide (HYZAAR) 100-25 MG per tablet Take 1 tablet by mouth daily.   Marland Kitchen lubiprostone (AMITIZA) 24 MCG capsule Take 1 capsule (24 mcg total) by mouth 2 (two) times daily with a meal.   . metFORMIN (GLUCOPHAGE) 500 MG tablet Take 1 tablet (500 mg total) by mouth daily.   . Multiple Vitamin (MULTIVITAMIN) tablet Take 1 tablet by mouth daily.     . Omega-3 Fatty Acids (FISH OIL PO) Take 1 capsule by mouth at bedtime.   . ranitidine (ZANTAC) 150 MG tablet Take 1 tablet (150 mg total) by mouth at bedtime.   . traMADol (ULTRAM) 50 MG tablet Take  50 mg by mouth every 6 (six) hours as needed for moderate pain.    . [DISCONTINUED] HYDROcodone-acetaminophen (NORCO/VICODIN) 5-325 MG per tablet Take 1 tablet by mouth every 6 (six) hours as needed. For pain    No facility-administered encounter medications on file as of 08/09/2015.     Allergies: No Known Allergies  Family History: Family History  Problem Relation Age of Onset  . Kidney cancer Father   . Stomach cancer      aunt  . Breast cancer Maternal Grandmother   . Colon cancer Neg Hx   . Diabetes Mother   . Hypertension Mother   . Heart Problems Mother   . Hyperlipidemia Mother   . Cancer Mother   . Hypertension Father   . Hyperlipidemia Father   . Cancer Father   . Kidney disease Maternal  Grandfather   . Breast cancer Paternal Grandmother   . Hypertension Brother   . Hyperlipidemia Brother   . Kidney disease Brother   . Hypertension Brother   . Hypertension Sister   . Hypertension Sister   . Hyperlipidemia Sister     Social History: Social History  Substance Use Topics  . Smoking status: Former Smoker -- 0.50 packs/day    Types: Cigarettes    Quit date: 04/04/1993  . Smokeless tobacco: Never Used     Comment: 17 yrs ago  . Alcohol Use: No   Social History   Social History Narrative   Lives with husband in an apartment on the first floor.  Has 4 children.  Currently does not work.  Trying to get disability.  Formerly worked as a Teacher, early years/pre.  Education: 11th grade.    Review of Systems:  CONSTITUTIONAL: No fevers, chills, night sweats, or weight loss.   EYES: No visual changes or eye pain ENT: No hearing changes.  No history of nose bleeds.   RESPIRATORY: No cough, wheezing and shortness of breath.   CARDIOVASCULAR: Negative for chest pain, and palpitations.   GI: Negative for abdominal discomfort, blood in stools or black stools.  No recent change in bowel habits.   GU:  No history of incontinence.   MUSCLOSKELETAL: +history of joint pain or  swelling.  No myalgias.   SKIN: Negative for lesions, rash, and itching.   HEMATOLOGY/ONCOLOGY: Negative for prolonged bleeding, bruising easily, and swollen nodes.  No history of cancer.   ENDOCRINE: Negative for cold or heat intolerance, polydipsia or goiter.   PSYCH:  +depression or anxiety symptoms.   NEURO: As Above.   Vital Signs:  BP 144/74 mmHg  Pulse 73  Ht 5\' 8"  (1.727 m)  Wt 279 lb (126.554 kg)  BMI 42.43 kg/m2  SpO2 98%   General Medical Exam:   General:  Well appearing, comfortable.   Eyes/ENT: see cranial nerve examination.   Neck: No masses appreciated.  Full range of motion without tenderness.  No carotid bruits. Respiratory:  Clear to auscultation, good air entry bilaterally.   Cardiac:  Regular rate and rhythm, no murmur.   Extremities:  No deformities, edema, or skin discoloration.  Skin:  No rashes or lesions.  Neurological Exam: MENTAL STATUS including orientation to time, place, person, recent and remote memory, attention span and concentration, language, and fund of knowledge is normal.  Speech is not dysarthric.  CRANIAL NERVES: II:  No visual field defects.  Unremarkable fundi.   III-IV-VI: Pupils equal round and reactive to light.  Normal conjugate, extra-ocular eye movements in all directions of gaze.  No nystagmus.  No ptosis.   V:  Normal facial sensation.     VII:  Normal facial symmetry and movements.  No pathologic facial reflexes.  VIII:  Normal hearing and vestibular function.   IX-X:  Normal palatal movement.   XI:  Normal shoulder shrug and head rotation.   XII:  Normal tongue strength and range of motion, no deviation or fasciculation.  MOTOR:  No atrophy, fasciculations or abnormal movements.  No pronator drift.  Tone is normal.    Right Upper Extremity:    Left Upper Extremity:    Deltoid  5/5   Deltoid  5/5   Biceps  5/5   Biceps  5/5   Triceps  5/5   Triceps  5/5   Wrist extensors  5/5   Wrist extensors  5/5   Wrist flexors  5/5    Wrist flexors  5/5   Finger extensors  5/5   Finger extensors  5/5   Finger flexors  5/5   Finger flexors  5/5   Dorsal interossei  5/5   Dorsal interossei  5/5   Abductor pollicis  5/5   Abductor pollicis  5/5   Tone (Ashworth scale)  0  Tone (Ashworth scale)  0   Right Lower Extremity:    Left Lower Extremity:    Hip flexors  5/5   Hip flexors  5/5   Hip extensors  5/5   Hip extensors  5/5   Knee flexors  5/5   Knee flexors  5/5   Knee extensors  5/5   Knee extensors  5/5   Dorsiflexors  5/5   Dorsiflexors  5/5   Plantarflexors  5/5   Plantarflexors  5/5   Toe extensors  5/5   Toe extensors  5/5   Toe flexors  5/5   Toe flexors  5/5   Tone (Ashworth scale)  0  Tone (Ashworth scale)  0   MSRs:  Right                                                                 Left brachioradialis 3+  brachioradialis 3+  biceps 3+  biceps 3+  triceps 3+  triceps 3+  patellar 3+  patellar 3+  ankle jerk 2+  ankle jerk 2+  Hoffman no  Hoffman no  plantar response down  plantar response down   SENSORY:  Normal and symmetric perception of light touch, pinprick, vibration, and proprioception.  Romberg's sign absent.   COORDINATION/GAIT: Normal finger-to- nose-finger and heel-to-shin.  Intact rapid alternating movements bilaterally.  Able to rise from a chair without using arms.  Gait is antalgic and unassisted.   IMPRESSION: Mrs. Lamba is a 59 year-old female with history of pituitary adenoma s/p resection referred for evaluation of transient right sided paresthesias of the face, arm, and leg.  Her exam discloses generalized hyperreflexia throughout, with normal tone, intact sensation and strength.  It is possible that she has cervical canal stenosis, but this would not explain her facial paresthesias, therefore I will also order MRI brain to exclude structural lesion (recurrence of pituitary mass) and demyelinating disease.   Migraine variant is another possibility, if her MRI brain returns  nondiagnostic.  PLAN/RECOMMENDATIONS:  1.  MRI brain wwo contrast 2.  MRI cervical spine wo contrast 3.  Increase gabapentin to 900mg  TID (titration scheduled provided)  Return to clinic in 3 months  The duration of this appointment visit was 45 minutes of face-to-face time with the patient.  Greater than 50% of this time was spent in counseling, explanation of diagnosis, planning of further management, and coordination of care.   Thank you for allowing me to participate in patient's care.  If I can answer any additional questions, I would be pleased to do so.    Sincerely,    Anaih Brander K. Posey Pronto, DO

## 2015-08-12 ENCOUNTER — Ambulatory Visit: Payer: 59

## 2015-08-12 NOTE — Progress Notes (Signed)
Note routed

## 2015-08-13 ENCOUNTER — Telehealth: Payer: Self-pay | Admitting: Neurology

## 2015-08-13 ENCOUNTER — Other Ambulatory Visit: Payer: Self-pay

## 2015-08-13 MED ORDER — LEVOTHYROXINE SODIUM 88 MCG PO TABS: 88.0000 ug | ORAL_TABLET | Freq: Every day | ORAL | Status: DC

## 2015-08-13 NOTE — Telephone Encounter (Signed)
Apolonio Schneiders from/[facility]//called for Pre-Auth# for an MRI/ CPT Codes/ [id number] & [phone]

## 2015-08-13 NOTE — Telephone Encounter (Signed)
Called and gave PA numbers.

## 2015-08-15 ENCOUNTER — Telehealth: Payer: Self-pay | Admitting: Neurology

## 2015-08-15 NOTE — Telephone Encounter (Signed)
Called and discussed results of MRI brain and cervical spine.  No significant abnormalities on brain imaging. On her cervical spine MRI, there is cervical spondylosis with moderate canal stenosis with flattening of the cord at this level.  She is already planning on seeing a spine surgeon for opinion.  Overall, paresthesias are better after increasing gabapentin.  MRI reports faxed from [facility] MRI cervical spine wo contrast 08/14/2015: 1.  Multilevel cervical spondylosis which overall appears worsened from the May 2012 comparison.  Most significant level at C4-C5 where there is moderate central canal stenosis with flattening of the ventral cord surface and moderate left foraminal stenosis.    MRI brain wwo contrast 08/14/2015: 1. No evidence of pituitary adenoma.  Pituitary gland size is at the upper limits of normal for age and sex.  Correlate clinically. Consider 97-month follow-up study to ensure stability. 2. Partially demonstrated degenerative disc disease at C4-C5 and C5-6.   Donika K. Posey Pronto, DO

## 2015-08-21 ENCOUNTER — Ambulatory Visit: Payer: 59 | Admitting: Allergy and Immunology

## 2015-08-22 ENCOUNTER — Encounter: Payer: Self-pay | Admitting: Allergy and Immunology

## 2015-08-22 ENCOUNTER — Ambulatory Visit (INDEPENDENT_AMBULATORY_CARE_PROVIDER_SITE_OTHER): Payer: 59 | Admitting: Allergy and Immunology

## 2015-08-22 VITALS — BP 138/68 | HR 78 | Temp 99.3°F | Resp 24 | Ht 66.73 in | Wt 282.0 lb

## 2015-08-22 DIAGNOSIS — J309 Allergic rhinitis, unspecified: Secondary | ICD-10-CM

## 2015-08-22 DIAGNOSIS — R05 Cough: Secondary | ICD-10-CM | POA: Diagnosis not present

## 2015-08-22 DIAGNOSIS — R062 Wheezing: Secondary | ICD-10-CM

## 2015-08-22 DIAGNOSIS — R059 Cough, unspecified: Secondary | ICD-10-CM

## 2015-08-22 DIAGNOSIS — H1010 Acute atopic conjunctivitis, unspecified eye: Secondary | ICD-10-CM

## 2015-08-22 NOTE — Progress Notes (Deleted)
FOLLOW UP NOTE  RE: Robin [name] MRN: [id number] DOB: 1955-12-02 [facility] [place] [address] Date of Office Visit: 08/22/2015  Subjective:  Robin [name] is a 59 y.o. female who presents today for Nasal Congestion; Eye Problem; Headache; and Cough   Assessment:  No diagnosis found. Plan:  No orders of the defined types were placed in this encounter.   There are no Patient Instructions on file for this visit.   HPI:   Current Medications: 1.  Flonase 2 sprays once daily. 2.  Dexilant 60 mg once daily. 3.  Aspirin 81 mg, Wellbutrin, Celebrex, WelChol, ferrous sulfate, Neurontin, Synthroid, Hyzaar, Amitiza, metformin, multivitamin, omega-3 fish oil, Zantac, and tramadol daily. 4.  Previously used Zyrtec, Claritin and Allegra currently off.  Drug Allergies: No Known Allergies  Objective:   Filed Vitals:   08/22/15 1348  BP: 138/68  Pulse: 78  Temp: 99.3 F (37.4 C)  Resp: 24   Physical Exam  Constitutional: She is well-developed, well-nourished, and in no distress.  HENT:  Head: Atraumatic.  Right Ear: Tympanic membrane and ear canal normal.  Left Ear: Tympanic membrane and ear canal normal.  Nose: Mucosal edema present. No rhinorrhea. No epistaxis.  Mouth/Throat: Oropharynx is clear and moist and mucous membranes are normal. No oropharyngeal exudate, posterior oropharyngeal edema or posterior oropharyngeal erythema.  Eyes: Conjunctivae are normal.  Neck: Neck supple.  Cardiovascular: Normal rate, S1 normal and S2 normal.   No murmur heard. Pulmonary/Chest: Effort normal. She has no wheezes. She has no rhonchi. She has no rales.  Abdominal: Soft. Normal appearance and bowel sounds are normal.  Musculoskeletal: She exhibits no edema.  Lymphadenopathy:    She has no cervical adenopathy.  Neurological: She is alert.  Skin: Skin is warm and intact. No rash noted. No  cyanosis. Nails show no clubbing.    Diagnostics: Spirometry FVC 2.32--119%, FEV1 1.99-121%.    Jovante Hammitt M. [name], MD   cc:  Sharilyn Sites, MD

## 2015-08-22 NOTE — Patient Instructions (Signed)
Take Home Sheet  1. Avoidance: Mite and Mold   2. Antihistamine: Allegra 180mg  by mouth  Once daily for runny nose or itching.   3. Nasal Spray: Rhinocort 1-2 spray(s) each nostril onvr daily for stuffy nose or drainage.     Stop Flonase  4. Inhalers:  Rescue: ProAir respiclick 2 puffs every 4hours as needed for cough or wheeze.       -May use 2 puffs 10-20 minutes prior to exercise.    5. Eye Drops: Zaditor--over the counter one drop(s) each eye once daily for itchy eyes.   6. Nasal Saline wash followed by nasal spray once daily as directed.   7. Follow up Visit:  2 months or sooner if needed.   Websites that have reliable Patient information: 1. American Academy of Asthma, Allergy, & Immunology: www.aaaai.org 2. Food Allergy Network: www.foodallergy.org 3. Mothers of Asthmatics: www.aanma.org 4. [place]: DiningCalendar.de 5. American College of Allergy, Asthma, & Immunology: https://robertson.info/ or www.acaai.org  Control of House Dust Mite Allergen  House dust mites play a major role in allergic asthma and rhinitis.  They occur in environments with high humidity wherever human skin, the food for dust mites is found. High levels have been detected in dust obtained from mattresses, pillows, carpets, upholstered furniture, bed covers, clothes and soft toys.  The principal allergen of the house dust mite is found in its feces.  A gram of dust may contain 1,000 mites and 250,000 fecal particles.  Mite antigen is easily measured in the air during house cleaning activities.  1. Encase mattresses, including the box spring, and pillow, in an air tight cover.  Seal the zipper end of the encased mattresses with wide adhesive tape. 2. Wash the bedding in water of 130 degrees Farenheit weekly.  Avoid cotton comforters/quilts and flannel bedding: the most ideal bed covering is the dacron comforter. 3. Remove all upholstered furniture from the  bedroom. 4. Remove carpets, carpet padding, rugs, and non-washable window drapes from the bedroom.  Wash drapes weekly or use plastic window coverings. 5. Remove all non-washable stuffed toys from the bedroom.  Wash stuffed toys weekly. 6. Have the room cleaned frequently with a vacuum cleaner and a damp dust-mop.  The patient should not be in a room which is being cleaned and should wait 1 hour after cleaning before going into the room. 7. Close and seal all heating outlets in the bedroom.  Otherwise, the room will become filled with dust-laden air.  An electric heater can be used to heat the room. 8. Reduce indoor humidity to less than 50%.  Do not use a humidifier.  Reducing Pollen Exposure  The American Academy of Allergy, Asthma and Immunology suggests the following steps to reduce your exposure to pollen during allergy seasons.  9. Do not hang sheets or clothing out to dry; pollen may collect on these items. 10. Do not mow lawns or spend time around freshly cut grass; mowing stirs up pollen. 11. Keep windows closed at night.  Keep car windows closed while driving. 12. Minimize morning activities outdoors, a time when pollen counts are usually at their highest. 13. Stay indoors as much as possible when pollen counts or humidity is high and on windy days when pollen tends to remain in the air longer. 14. Use air conditioning when possible.  Many air conditioners have filters that trap the pollen spores. 15. Use a HEPA room air filter to remove pollen form the indoor air you breathe.  Control of Mold Allergen  Mold and fungi can grow on a variety of surfaces provided certain temperature and moisture conditions exist.  Outdoor molds grow on plants, decaying vegetation and soil.  The major outdoor mold, Alternaria dn Cladosporium, are found in very high numbers during hot and dry conditions.  Generally, a late Summer - Fall peak is seen for common outdoor fungal spores.  Rain will temporarily  lower outdoor mold spore count, but counts rise rapidly when the rainy period ends.  The most important indoor molds are Aspergillus and Penicillium.  Dark, humid and poorly ventilated basements are ideal sites for mold growth.  The next most common sites of mold growth are the bathroom and the kitchen.  Outdoor [employer] 1. Use air conditioning and keep windows closed 2. Avoid exposure to decaying vegetation. 3. Avoid leaf raking. 4. Avoid grain handling. 5. Consider wearing a face mask if working in moldy areas.  Indoor Mold Control 1. Maintain humidity below 50%. 2. Clean washable surfaces with 5% bleach solution. 3. Remove sources e.g. Contaminated carpets.  Control of Cockroach Allergen  Cockroach allergen has been identified as an important cause of acute attacks of asthma, especially in urban settings.  There are fifty-five species of cockroach that exist in the [place], however only three, the [place], [place] species produce allergen that can affect patients with Asthma.  Allergens can be obtained from fecal particles, egg casings and secretions from cockroaches.  1. Remove food sources. 2. Reduce access to water. 3. Seal access and entry points. 4. Spray runways with 0.5-1% Diazinon or Chlorpyrifos 5. Blow boric acid power under stoves and refrigerator. 6. Place bait stations (hydramethylnon) at feeding sites.

## 2015-08-23 MED ORDER — BUDESONIDE 32 MCG/ACT NA SUSP: 2.0000 | Freq: Every day | NASAL | Status: DC

## 2015-08-23 MED ORDER — ALBUTEROL SULFATE 108 (90 BASE) MCG/ACT IN AEPB: 2.0000 | INHALATION_SPRAY | RESPIRATORY_TRACT | Status: DC | PRN

## 2015-09-03 ENCOUNTER — Encounter: Payer: Self-pay | Admitting: Neurology

## 2015-09-04 ENCOUNTER — Ambulatory Visit: Payer: Self-pay | Admitting: Allergy and Immunology

## 2015-09-05 ENCOUNTER — Encounter: Payer: Self-pay | Admitting: Neurology

## 2015-09-05 NOTE — Progress Notes (Addendum)
NEW PATIENT NOTE  RE: Robin [name] MRN: [id number] DOB: Jan 10, 1956 [facility] [place] [facility]  [address] [place] [address] Date of Office Visit: 08/22/2015  Referring provider: Sharilyn Sites, MD [address] [place], [place] [address]  Subjective:  Robin [name] is a 59 y.o. female who presents today for Nasal Congestion; Eye Problem; Headache; and Cough  Assessment:   1. Allergic rhinoconjunctivitis   2. Cough and patient report of wheeze.   3.   Former smoker.    4.      History of GE reflux, well controlled on daily PPI and H2 blocker. Plan:   Meds ordered this encounter  Medications  . budesonide (RHINOCORT AQUA) 32 MCG/ACT nasal spray    Sig: Place 2 sprays into both nostrils daily.    Dispense:  1 Bottle    Refill:  5  . Albuterol Sulfate (PROAIR RESPICLICK) 123XX123 (90 BASE) MCG/ACT AEPB    Sig: Inhale 2 puffs into the lungs as needed.    Dispense:  1 each    Refill:  1   Patient Instructions   1. Avoidance: Mite and Mold 2. Antihistamine: Allegra 180mg  by mouth  Once daily for runny nose or itching. 3. Nasal Spray: Rhinocort 1-2 spray(s) each nostril onvr daily for stuffy nose or drainage.     Stop Flonase 4. Inhalers:  Rescue: ProAir respiclick 2 puffs every 4hours as needed for cough or wheeze.       -May use 2 puffs 10-20 minutes prior to exercise. 5. Eye Drops: Zaditor--over the counter one drop(s) each eye once daily for itchy eyes. 6. Nasal Saline wash followed by nasal spray once daily as directed. 7. Follow up Visit:  2 months or sooner if needed.  HPI: Robin [name], a 59 year old presents with greater than 20 year history of year-round rhinorrhea, congestion, sneezing, itchy watery eyes and cough which is intermittently associated with wheeze and chest congestion without chest tightness, shortness of breath or difficulty breathing.  She denies any previous asthma diagnosis but was evaluated in  our office in 2005 with Dr. Clarene Essex, which showed mildly reactive skin testing consistent with allergic rhinitis.  Robin [name] is concerned of more prominent symptoms related to possible mold exposures in her home, given increasing morning symptoms in the last 5 years, which include clear phlegm production each morning.  Generally she describes pollen, dust, mold, outdoors, fluctuant weather patterns and strong odors, as provoking factors of symptoms and this year 2016, concern for sinusitis.  She has been evaluated by Dr. Erik Obey, ENT and recent discussions regarding tonsils.  (No tonsil infections).  She does not recall a sinus CT scan and several years ago was last chest x-ray. Denies any other recurring symptoms.  Medical History: Past Medical History  Diagnosis Date  . GERD (gastroesophageal reflux disease)   . Constipation   . Anxiety disorder   . Hyperlipidemia   . Hypertension   . Chronic neck pain   . Schatzki's ring     non critical  . Hiatal hernia     small  . Sleep apnea     CPAP, Sleep study at [facility]  . Arthritis   . Diabetes mellitus without complication [facility])    Surgical History: Past Surgical History  Procedure Laterality Date  . Knee arthroscopy  04/27/2001  . Cardiac catheterization  08/17/2001    normal L main/LAD/L Cfx/RCA (Dr. Adora Fridge)  . Vaginal prolapse repair    .  Rectocele repair    . Esophagogastroduodenoscopy  08/23/2008    [id number] distal esophageal erosions consistent with mild erosive reflux esophagitis, otherwise unremarkable esophagus/ Tiny antral erosions of doubtful clinical significance, otherwise normal stomach, patent pylorus, normal D1 and D2  . Colonoscopy  2006    Dr. [name], hyperplastic polyps  . Colonoscopy  08/06/11    abnormal terminal ileum for 10cm, erosions, geographical ulceration. Bx small bowel mucosa with prominent intramucosal lymphoid aggregates, slightly inflammed  . Esophagogastroduodenoscopy  08/06/11     small hh, noncritical Schatzki's ring s/p 24 F  . Abdominal hysterectomy    . Direct laryngoscopy  03/15/2012    Procedure: DIRECT LARYNGOSCOPY;  Surgeon: [name], MD;  Location: [facility];  Service: ENT;  Laterality: N/A; Dr. Wolicki--:>no foreign body seen. normal esophagus to 40cm  . Esophagoscopy  03/15/2012    Procedure: ESOPHAGOSCOPY;  Surgeon: [name], MD;  Location: [facility] OR;  Service: ENT;  Laterality: N/A;  . 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 Abdominal hysterectomy  02/18/2000  . Transthoracic echocardiogram  2003    EF normal  . Nm myocar perf wall motion  2003    negative bruce protocol exercise tress test; EF 68%; intermediate risk study due to evidence of anterior wall ischemia extending from mid-ventricle to apex  . Esophagogastroduodenoscopy N/A 04/17/2015    [id number] s/p dilation  . Esophageal dilation  04/17/2015    Procedure: ESOPHAGEAL DILATION;  Surgeon: [name], MD;  [facility];  Service: Endoscopy;;   Family History: Family History  Problem Relation Age of Onset  . Kidney cancer Father   . Hypertension Father   . Hyperlipidemia Father   . Cancer Father   . Stomach cancer      aunt  . Breast cancer Maternal Grandmother   . Colon cancer Neg Hx   . Asthma Neg Hx   . Eczema Neg Hx   . Immunodeficiency Neg Hx   . Urticaria Neg Hx   . Diabetes Mother   . Hypertension Mother   . Heart Problems Mother   . Hyperlipidemia Mother   . Cancer Mother   . Kidney disease Maternal Grandfather   . Breast cancer Paternal Grandmother   . Hypertension Brother   . Hyperlipidemia Brother   . Liver disease Brother   . Arthritis Brother   . Hypertension Brother   . Hypertension Sister   . Allergic rhinitis Sister   . Hypertension Sister   . Hyperlipidemia Sister    Social History: Social History  . Marital Status: Married    Spouse Name: N/A  . Number of Children: 4  . Years of Education: 10   Social History  Main Topics  . Smoking status: Former Smoker -- 0.50 packs/day    Types: Cigarettes    Quit date: 04/04/1993  . Smokeless tobacco: Never Used     Comment: 17 yrs ago  . Alcohol Use: No  . Drug Use: No  . Sexual Activity: Not on file   Social History Narrative   Lives with husband in an apartment on the first floor.  Has 4 children.     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  Trying to get disability.  Education: 11th grade.   Current Medications: 1. Flonase 2 sprays once daily. 2. Dexilant 60 mg once daily. 3. Aspirin 81 mg, Wellbutrin, Celebrex, WelChol, ferrous sulfate, Neurontin, Synthroid, Hyzaar, Amitiza, metformin,  multivitamin, omega-3 fish oil, Zantac, and tramadol daily. 4. Previously used Zyrtec, Claritin and Allegra currently off.  Drug Allergies: No Known Allergies  Environmental History: Kassidy lives in a greater then 57 year old apartment for 5 years which is carpeted throughout with central air and heat without humidifier pets or smokers.  Sleeps on stuffed mattress with non-feather pillow and comforter.  Review of Systems  Constitutional: Negative for fever, weight loss and malaise/fatigue.  HENT: Positive for congestion. Negative for ear pain, hearing loss, nosebleeds and sore throat.   Eyes: Negative for discharge and redness.  Respiratory: Negative for shortness of breath.        Denies history of bronchitis and pneumonia.  Gastrointestinal: Negative for heartburn, nausea, vomiting, abdominal pain, diarrhea and constipation.  Genitourinary: Negative.   Musculoskeletal: Negative for myalgias and joint pain.  Skin: Negative.  Negative for itching and rash.  Neurological: Negative.  Negative for dizziness, seizures, weakness and headaches.  Endo/Heme/Allergies: Positive for environmental allergies.       Denies sensitivity to aspirin, NSAIDs, stinging insects, foods, latex jewelry and cosmetics.    Objective:   Filed Vitals:    08/22/15 1348  BP: 138/68  Pulse: 78  Temp: 99.3 F (37.4 C)  Resp: 24   Physical Exam  Constitutional: She is well-developed, well-nourished, and in no distress.  HENT:  Head: Atraumatic.  Right Ear: Tympanic membrane and ear canal normal.  Left Ear: Tympanic membrane and ear canal normal.  Nose: Mucosal edema present. No rhinorrhea. No epistaxis.  Mouth/Throat: Oropharynx is clear and moist and mucous membranes are normal. No oropharyngeal exudate, posterior oropharyngeal edema or posterior oropharyngeal erythema.  Eyes: Conjunctivae are normal.  Neck: Neck supple.  Cardiovascular: Normal rate, S1 normal and S2 normal.   No murmur heard. Pulmonary/Chest: Effort normal. She has no wheezes. She has no rhonchi. She has no rales.  Abdominal: Soft. Normal appearance and bowel sounds are normal.  Musculoskeletal: She exhibits no edema.  Lymphadenopathy:    She has no cervical adenopathy.  Neurological: She is alert.  Skin: Skin is warm and intact. No rash noted. No cyanosis. Nails show no clubbing.    Diagnostics: Spirometry: FVC 2.32--119%, FEV1 1.99--121%.  Skin testing: Mild reactivity to selected molds, cockroach and dust mite mix via intradermal only.    Kiosha Buchan M. [name], MD  cc: Sharilyn Sites, MD

## 2015-10-01 ENCOUNTER — Telehealth: Payer: Self-pay | Admitting: Internal Medicine

## 2015-10-01 NOTE — Telephone Encounter (Signed)
Patient called to see if [facility] was in network with her new [employer]. I told her that she should call her insurance to verify with them. Patient then asked when was she due to follow up with [name]. She was last seen by [facility]SL in August. I didn't see where she needed to follow up but she was to be referred to Dr Arnoldo Morale or to [facility] for gallbladder/gallstones. Patient said that she hasn't heard from anyone about this. Please advise and call patient back at [phone]

## 2015-10-02 NOTE — Telephone Encounter (Signed)
I had to call patients PCP so that they could fax a referral over to [facility] since she has [facility] compass. So I am at a stand still until then. I have faxed over our information.

## 2015-10-02 NOTE — Telephone Encounter (Signed)
I called Robin [name] at [facility] and the referral was made but the patient has an out standing bill. She must pay it first before they can see her. I left a message for her to call me back

## 2015-10-02 NOTE — Telephone Encounter (Signed)
Pt called back is aware that she has a bill at Dr.Jenkins' office and would like to go to [facility] now. I will send the referral over today.

## 2015-10-03 NOTE — Telephone Encounter (Signed)
Called pt and she informed me that she will have [employer] beginning on 10/06/2015.  Called [facility] and gave information regarding policy group number and ID number to [name].  [name] states she will call patient and get her scheduled.

## 2015-10-03 NOTE — Telephone Encounter (Signed)
[name] from [facility] called this morning to inform us that they do not take [facility].

## 2015-10-14 ENCOUNTER — Ambulatory Visit: Payer: 59 | Admitting: "Endocrinology

## 2015-10-24 ENCOUNTER — Ambulatory Visit: Payer: 59 | Admitting: Allergy and Immunology

## 2015-10-28 ENCOUNTER — Ambulatory Visit: Payer: Self-pay | Admitting: "Endocrinology

## 2015-11-05 ENCOUNTER — Telehealth: Payer: Self-pay | Admitting: Internal Medicine

## 2015-11-05 NOTE — Telephone Encounter (Signed)
Anna INQUIRING ABOUT DEXLIANT SAMPLES. SHE HAS LOST HER INSURANCE COVERAGE.

## 2015-11-06 NOTE — Telephone Encounter (Signed)
Left #2 boxes of dexilant and pt assistance forms at the front desk. Pt is aware.

## 2015-11-21 ENCOUNTER — Ambulatory Visit: Payer: 59 | Admitting: Neurology

## 2015-11-27 ENCOUNTER — Ambulatory Visit: Payer: Self-pay | Attending: Internal Medicine

## 2015-12-09 ENCOUNTER — Encounter: Payer: Self-pay | Admitting: Clinical

## 2015-12-09 ENCOUNTER — Ambulatory Visit: Payer: Medicaid Other | Attending: Internal Medicine | Admitting: Internal Medicine

## 2015-12-09 ENCOUNTER — Encounter: Payer: Self-pay | Admitting: Internal Medicine

## 2015-12-09 VITALS — BP 143/77 | HR 72 | Temp 98.3°F | Resp 15 | Ht 66.0 in | Wt 289.4 lb

## 2015-12-09 DIAGNOSIS — Z87891 Personal history of nicotine dependence: Secondary | ICD-10-CM | POA: Insufficient documentation

## 2015-12-09 DIAGNOSIS — E785 Hyperlipidemia, unspecified: Secondary | ICD-10-CM | POA: Diagnosis not present

## 2015-12-09 DIAGNOSIS — G8929 Other chronic pain: Secondary | ICD-10-CM | POA: Diagnosis not present

## 2015-12-09 DIAGNOSIS — M1710 Unilateral primary osteoarthritis, unspecified knee: Secondary | ICD-10-CM | POA: Insufficient documentation

## 2015-12-09 DIAGNOSIS — Z7984 Long term (current) use of oral hypoglycemic drugs: Secondary | ICD-10-CM | POA: Diagnosis not present

## 2015-12-09 DIAGNOSIS — F419 Anxiety disorder, unspecified: Secondary | ICD-10-CM | POA: Diagnosis not present

## 2015-12-09 DIAGNOSIS — M160 Bilateral primary osteoarthritis of hip: Secondary | ICD-10-CM | POA: Insufficient documentation

## 2015-12-09 DIAGNOSIS — M1990 Unspecified osteoarthritis, unspecified site: Secondary | ICD-10-CM

## 2015-12-09 DIAGNOSIS — K5909 Other constipation: Secondary | ICD-10-CM | POA: Insufficient documentation

## 2015-12-09 DIAGNOSIS — K449 Diaphragmatic hernia without obstruction or gangrene: Secondary | ICD-10-CM | POA: Insufficient documentation

## 2015-12-09 DIAGNOSIS — G4733 Obstructive sleep apnea (adult) (pediatric): Secondary | ICD-10-CM | POA: Diagnosis not present

## 2015-12-09 DIAGNOSIS — E034 Atrophy of thyroid (acquired): Secondary | ICD-10-CM

## 2015-12-09 DIAGNOSIS — E038 Other specified hypothyroidism: Secondary | ICD-10-CM

## 2015-12-09 DIAGNOSIS — Z7982 Long term (current) use of aspirin: Secondary | ICD-10-CM | POA: Diagnosis not present

## 2015-12-09 DIAGNOSIS — E119 Type 2 diabetes mellitus without complications: Secondary | ICD-10-CM | POA: Diagnosis not present

## 2015-12-09 DIAGNOSIS — Z79899 Other long term (current) drug therapy: Secondary | ICD-10-CM | POA: Diagnosis not present

## 2015-12-09 DIAGNOSIS — E039 Hypothyroidism, unspecified: Secondary | ICD-10-CM | POA: Insufficient documentation

## 2015-12-09 DIAGNOSIS — K219 Gastro-esophageal reflux disease without esophagitis: Secondary | ICD-10-CM | POA: Insufficient documentation

## 2015-12-09 DIAGNOSIS — I10 Essential (primary) hypertension: Secondary | ICD-10-CM | POA: Insufficient documentation

## 2015-12-09 DIAGNOSIS — J069 Acute upper respiratory infection, unspecified: Secondary | ICD-10-CM | POA: Insufficient documentation

## 2015-12-09 DIAGNOSIS — R0981 Nasal congestion: Secondary | ICD-10-CM | POA: Diagnosis present

## 2015-12-09 DIAGNOSIS — M179 Osteoarthritis of knee, unspecified: Secondary | ICD-10-CM | POA: Insufficient documentation

## 2015-12-09 LAB — POCT GLYCOSYLATED HEMOGLOBIN (HGB A1C): Hemoglobin A1C: 5.2

## 2015-12-09 LAB — GLUCOSE, POCT (MANUAL RESULT ENTRY): POC Glucose: 119 mg/dl — AB (ref 70–99)

## 2015-12-09 MED ORDER — GABAPENTIN 300 MG PO CAPS: 600.0000 mg | ORAL_CAPSULE | Freq: Two times a day (BID) | ORAL | Status: DC

## 2015-12-09 MED ORDER — ALBUTEROL SULFATE 108 (90 BASE) MCG/ACT IN AEPB: 2.0000 | INHALATION_SPRAY | RESPIRATORY_TRACT | Status: DC | PRN

## 2015-12-09 MED ORDER — ONE-DAILY MULTI VITAMINS PO TABS: 1.0000 | ORAL_TABLET | Freq: Every day | ORAL | Status: DC

## 2015-12-09 MED ORDER — PANTOPRAZOLE SODIUM 40 MG PO TBEC: 40.0000 mg | DELAYED_RELEASE_TABLET | Freq: Every day | ORAL | Status: DC

## 2015-12-09 MED ORDER — METFORMIN HCL 500 MG PO TABS
500.0000 mg | ORAL_TABLET | Freq: Every day | ORAL | Status: DC
Start: 2015-12-09 — End: 2015-12-20

## 2015-12-09 MED ORDER — CELECOXIB 200 MG PO CAPS: 200.0000 mg | ORAL_CAPSULE | Freq: Two times a day (BID) | ORAL | Status: DC | PRN

## 2015-12-09 MED ORDER — LOSARTAN POTASSIUM-HCTZ 100-25 MG PO TABS: 1.0000 | ORAL_TABLET | Freq: Every day | ORAL | Status: DC

## 2015-12-09 MED ORDER — FERROUS SULFATE 325 (65 FE) MG PO TABS: 325.0000 mg | ORAL_TABLET | Freq: Two times a day (BID) | ORAL | Status: DC

## 2015-12-09 MED ORDER — BUDESONIDE 32 MCG/ACT NA SUSP: 2.0000 | Freq: Every day | NASAL | Status: DC

## 2015-12-09 MED ORDER — LUBIPROSTONE 24 MCG PO CAPS: 24.0000 ug | ORAL_CAPSULE | Freq: Two times a day (BID) | ORAL | Status: DC

## 2015-12-09 MED ORDER — BUPROPION HCL ER (XL) 300 MG PO TB24: 300.0000 mg | ORAL_TABLET | ORAL | Status: DC

## 2015-12-09 MED ORDER — RANITIDINE HCL 150 MG PO TABS: 150.0000 mg | ORAL_TABLET | Freq: Every day | ORAL | Status: DC

## 2015-12-09 MED ORDER — FLUTICASONE PROPIONATE 50 MCG/ACT NA SUSP: 2.0000 | Freq: Every day | NASAL | Status: DC

## 2015-12-09 MED ORDER — LORATADINE 10 MG PO TABS: 10.0000 mg | ORAL_TABLET | Freq: Every day | ORAL | Status: DC

## 2015-12-09 MED ORDER — COLESEVELAM HCL 625 MG PO TABS: 1875.0000 mg | ORAL_TABLET | Freq: Two times a day (BID) | ORAL | Status: DC

## 2015-12-09 MED FILL — PROAIR RESPICLICK INHAL PWD: 108 (90 BAS | 30 days supply | Qty: 1 | Fill #0

## 2015-12-09 MED FILL — FLUTICASONE PROP 50 MCG SPR: 50 | 30 days supply | Qty: 16 | Fill #0

## 2015-12-09 MED FILL — **WELCHOL 625 MG TABLET: 625 MG | 6 days supply | Qty: 36 | Fill #0

## 2015-12-09 MED FILL — raNITIdine HCL 150 MG TABS: 150 | 30 days supply | Qty: 30 | Fill #0

## 2015-12-09 MED FILL — ?PANTOPRAZOLE SOD DR 40MG: 40 MG | 30 days supply | Qty: 30 | Fill #0

## 2015-12-09 MED FILL — LOSARTAN-HCTZ 100-25 MG TAB: 100-25 | 30 days supply | Qty: 30 | Fill #0

## 2015-12-09 MED FILL — ?BUPROPION HCL XL 300 MG TA: 300 | 30 days supply | Qty: 30 | Fill #0

## 2015-12-09 MED FILL — ?METFORMIN HCL 500MG TABLET: 500 | 30 days supply | Qty: 30 | Fill #0

## 2015-12-09 MED FILL — GABAPENTIN 300 MG CAPSULE: 300 | 15 days supply | Qty: 60 | Fill #0

## 2015-12-09 MED FILL — ?CELECOXIB 50 MG CAPSULE: 50 | 15 days supply | Qty: 120 | Fill #0

## 2015-12-09 NOTE — Progress Notes (Signed)
Patient here to establish care She has been out of all meds for weeks due to lack of insurance She complains of head congestion, cough, and left ear pain

## 2015-12-09 NOTE — Progress Notes (Signed)
Robin [name], is a 60 y.o. female  [id number]  [id number]  DOB - 1956-01-26  CC:  Chief Complaint  Patient presents with  . Establish Care  . Nasal Congestion       HPI: Robin [name] is a 60 y.o. female here today to establish medical care since she last her Obamacare abt 2 months ago. Co of some sinus congestion/seasonal allergies as well as diffuse hip/knee arthritis, for which she says needs surgery but now w/o insurance. Use to take celebrex, neurontin and prn tramadol in past.  Co of some intermittant swelling in legs as well bilaterally. She says her bp is labile, sometimes normal, as high has sbp 160s last month.  Has been off all her medications since early January due to rx/cost.  Patient has No headache, No chest pain, No abdominal pain - No Nausea, No new weakness tingling or numbness, No Cough - SOB.  No Known Allergies Past Medical History  Diagnosis Date  . GERD (gastroesophageal reflux disease)   . Constipation   . Anxiety disorder   . Hyperlipidemia   . Hypertension   . Chronic neck pain   . Schatzki's ring     non critical  . Hiatal hernia     small  . Sleep apnea     CPAP, Sleep study at [place]  . Arthritis   . Diabetes mellitus without complication [facility])    Current Outpatient Prescriptions on File Prior to Visit  Medication Sig Dispense Refill  . Albuterol Sulfate (PROAIR RESPICLICK) [id number] (90 BASE) MCG/ACT AEPB Inhale 2 puffs into the lungs as needed. (Patient not taking: Reported on 12/09/2015) 1 each 1  . aspirin 81 MG tablet Take 81 mg by mouth at bedtime. Reported on 12/09/2015    . budesonide (RHINOCORT AQUA) 32 MCG/ACT nasal spray Place 2 sprays into both nostrils daily. (Patient not taking: Reported on 12/09/2015) 1 Bottle 5  . buPROPion (WELLBUTRIN XL) 300 MG 24 hr tablet Take 300 mg by mouth every morning. Reported on 12/09/2015    . celecoxib (CELEBREX) 200 MG capsule Take 200 mg by mouth 2 (two) times daily as needed for mild  pain. Reported on 12/09/2015  1  . colesevelam (WELCHOL) 625 MG tablet Take 1,875 mg by mouth 2 (two) times daily with a meal. Reported on 12/09/2015    . dexlansoprazole (DEXILANT) 60 MG capsule Take 1 capsule (60 mg total) by mouth daily. (Patient not taking: Reported on 12/09/2015) 30 capsule 11  . ferrous sulfate 325 (65 FE) MG tablet Take 1 tablet (325 mg total) by mouth 2 (two) times daily with a meal. (Patient not taking: Reported on 12/09/2015) 30 tablet 0  . fexofenadine (ALLEGRA) 180 MG tablet Take 180 mg by mouth daily. Reported on 12/09/2015    . gabapentin (NEURONTIN) 300 MG capsule Take 600 mg by mouth 2 (two) times daily. Reported on 12/09/2015    . levothyroxine (SYNTHROID, LEVOTHROID) 88 MCG tablet Take 1 tablet (88 mcg total) by mouth daily. (Patient not taking: Reported on 12/09/2015) 30 tablet 2  . losartan-hydrochlorothiazide (HYZAAR) 100-25 MG per tablet Take 1 tablet by mouth daily. Reported on 12/09/2015    . lubiprostone (AMITIZA) 24 MCG capsule Take 1 capsule (24 mcg total) by mouth 2 (two) times daily with a meal. (Patient not taking: Reported on 12/09/2015) 60 capsule 5  . metFORMIN (GLUCOPHAGE) 500 MG tablet Take 1 tablet (500 mg total) by mouth daily. (Patient not taking: Reported on 12/09/2015) 30  tablet 3  . Multiple Vitamin (MULTIVITAMIN) tablet Take 1 tablet by mouth daily. Reported on 12/09/2015    . Omega-3 Fatty Acids (FISH OIL PO) Take 1 capsule by mouth at bedtime. Reported on 12/09/2015    . ranitidine (ZANTAC) 150 MG tablet Take 1 tablet (150 mg total) by mouth at bedtime. (Patient not taking: Reported on 12/09/2015) 30 tablet 5  . traMADol (ULTRAM) 50 MG tablet Take 50 mg by mouth every 6 (six) hours as needed for moderate pain. Reported on 12/09/2015     No current facility-administered medications on file prior to visit.   Family History  Problem Relation Age of Onset  . Kidney cancer Father   . Hypertension Father   . Hyperlipidemia Father   . Cancer Father   . Stomach cancer       aunt  . Breast cancer Maternal Grandmother   . Colon cancer Neg Hx   . Asthma Neg Hx   . Eczema Neg Hx   . Immunodeficiency Neg Hx   . Urticaria Neg Hx   . Diabetes Mother   . Hypertension Mother   . Heart Problems Mother   . Hyperlipidemia Mother   . Cancer Mother   . Kidney disease Maternal Grandfather   . Breast cancer Paternal Grandmother   . Hypertension Brother   . Hyperlipidemia Brother   . Liver disease Brother   . Arthritis Brother   . Hypertension Brother   . Hypertension Sister   . Allergic rhinitis Sister   . Hypertension Sister   . Hyperlipidemia Sister    Social History   Social History  . Marital Status: Married    Spouse Name: N/A  . Number of Children: 4  . Years of Education: 10   Occupational History  .     Social History Main Topics  . Smoking status: Former Smoker -- 0.50 packs/day    Types: Cigarettes    Quit date: 04/04/1993  . Smokeless tobacco: Never Used     Comment: 17 yrs ago  . Alcohol Use: No  . Drug Use: No  . Sexual Activity: Not on file   Other Topics Concern  . Not on file   Social History Narrative   Lives with husband in an apartment on the first floor.  Has 4 children.     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  Trying to get disability.  Formerly worked as a Teacher, early years/pre.  Education: 11th grade.    Review of Systems: Constitutional: Negative for fever, chills, diaphoresis, activity change, appetite change and fatigue. HENT: sinus congestion Eyes: Negative for pain, discharge, redness, itching and visual disturbance. Respiratory: Negative for cough, choking, chest tightness, shortness of breath, wheezing and stridor.  Cardiovascular: Negative for chest pain, palpitations.  + leg swelling bilaterally in am Gastrointestinal: Negative for abdominal distention. Genitourinary: Negative for dysuria, urgency, frequency, hematuria, flank pain, decreased urine volume, difficulty urinating and  dyspareunia.  Musculoskeletal: Negative for back pain, joint swelling, arthralgia and gait problem. Neurological: Negative for dizziness, tremors, seizures, syncope, facial asymmetry, speech difficulty, weakness, light-headedness, numbness and headaches.  Hematological: Negative for adenopathy. Does not bruise/bleed easily. Psychiatric/Behavioral: Negative for hallucinations, behavioral problems, confusion, dysphoric mood, decreased concentration and agitation.    Objective:   Filed Vitals:   12/09/15 1135  BP: 143/77  Pulse: 72  Temp: 98.3 F (36.8 C)  Resp: 15    Physical Exam: Constitutional: Patient appears well-developed and well-nourished. No distress.  Obese, pleasant, aaox 3 HENT: Normocephalic, atraumatic, External right and left ear normal. Oropharynx is clear and moist.   Bilateral ear canals clear, clear serous drainage behind typanic membrains.  Boggy nairs.  Some tenderness in sinus/facial sinuses. Eyes: Conjunctivae and EOM are normal. PERRL, no scleral icterus. Neck: Normal ROM. Neck supple. No JVD.  CVS: RRR, S1/S2 +, no murmurs, no gallops, no carotid bruit.   1+ bilateral le edema bilaterally. Pulmonary: Effort and breath sounds normal, no stridor, rhonchi, wheezes, rales.  Abdominal: Soft. BS +, no distension, tenderness, rebound or guarding.  Musculoskeletal: Normal range of motion. No edema and no tenderness.  Lymphadenopathy: No lymphadenopathy noted, cervical, inguinal or axillary Neuro: Alert. Normal reflexes, muscle tone coordination. No cranial nerve deficit. Skin: Skin is warm and dry. No rash noted. Not diaphoretic. No erythema. No pallor. Psychiatric: Normal mood and affect. Behavior, judgment, thought content normal.  Lab Results  Component Value Date   WBC 8.1 04/20/2015   HGB 11.8* 04/20/2015   HCT 37.5 04/20/2015   MCV 90.1 04/20/2015   PLT 386 04/20/2015   Lab Results  Component Value Date   CREATININE 0.96 04/20/2015   BUN 12 04/20/2015     NA 139 04/20/2015   K 3.6 04/20/2015   CL 103 04/20/2015   CO2 27 04/20/2015    Lab Results  Component Value Date   HGBA1C 5.2 12/09/2015   Lipid Panel     Component Value Date/Time   CHOL 220* 03/04/2015 2116   TRIG 82 03/04/2015 2116   HDL 82 03/04/2015 2116   CHOLHDL 2.7 03/04/2015 2116   VLDL 16 03/04/2015 2116   LDLCALC 122* 03/04/2015 2116       Assessment and plan:   1. Type 2 diabetes mellitus without complication, without long-term current use of insulin (HCC) - HgB A1c 5.2 poc - Glucose (CBG) 112 - off all medications about 1 1/2 months. - renewed metformin - chk labs next week.  2. Allergies/uri  - trial claritin and flonase  - was on allegra d per allergist, has been unable to see since off insurance  3. Htn, controlled, but labile per pt.  - off meds 1 1/2 months, some le swelling bilaterally today.  - resume hyzaar today  4. Arthritis, bad, bilateral hips/knees  - renewed celebrex and gabapentin  5. Chronic constipation,   - renewed amtizia  6. gerd  - rx protonix, deselant not on formulary; zantac renewed as well  7. Osa,   - has cpap machine, but since her pituitary surg 4 years ago, mask doesn't fit due to sensitivity/size.  - will see if can get pulm rn to come by and get mask fitted? Not certain if available, will chk.  8. Hypothyroidism,  - renewed synthroid, chk tsh next wk   9. Morbid obesity  - encouraged diet/exercise  10. Social stressors  - husband recently lost job and dx w/ renal cell cancer.  Fu 2-3 months Labs next week, inlucding fasting lipids, bmp, cbc, tsh   The patient was given clear instructions to go to ER or return to medical center if symptoms don't improve, worsen or new problems develop. The patient verbalized understanding. The patient was told to call to get lab results if they haven't heard anything in the next week.      Maren Reamer, MD, [place], [place]   12/09/2015, 11:44 AM

## 2015-12-09 NOTE — Patient Instructions (Signed)
- next week fasting lab work. - fu w/ me in 2-3 months - fu financial office  - pick up meds  It was a pleasures meeting you today!   Hypertension Hypertension, commonly called high blood pressure, is when the force of blood pumping through your arteries is too strong. Your arteries are the blood vessels that carry blood from your heart throughout your body. A blood pressure reading consists of a higher number over a lower number, such as 110/72. The higher number (systolic) is the pressure inside your arteries when your heart pumps. The lower number (diastolic) is the pressure inside your arteries when your heart relaxes. Ideally you want your blood pressure below 120/80. Hypertension forces your heart to work harder to pump blood. Your arteries may become narrow or stiff. Having untreated or uncontrolled hypertension can cause heart attack, stroke, kidney disease, and other problems. RISK FACTORS Some risk factors for high blood pressure are controllable. Others are not.  Risk factors you cannot control include:   Race. You may be at higher risk if you are African American.  Age. Risk increases with age.  Gender. Men are at higher risk than women before age 33 years. After age 30, women are at higher risk than men. Risk factors you can control include:  Not getting enough exercise or physical activity.  Being overweight.  Getting too much fat, sugar, calories, or salt in your diet.  Drinking too much alcohol. SIGNS AND SYMPTOMS Hypertension does not usually cause signs or symptoms. Extremely high blood pressure (hypertensive crisis) may cause headache, anxiety, shortness of breath, and nosebleed. DIAGNOSIS To check if you have hypertension, your health care provider will measure your blood pressure while you are seated, with your arm held at the level of your heart. It should be measured at least twice using the same arm. Certain conditions can cause a difference in blood pressure  between your right and left arms. A blood pressure reading that is higher than normal on one occasion does not mean that you need treatment. If it is not clear whether you have high blood pressure, you may be asked to return on a different day to have your blood pressure checked again. Or, you may be asked to monitor your blood pressure at home for 1 or more weeks. TREATMENT Treating high blood pressure includes making lifestyle changes and possibly taking medicine. Living a healthy lifestyle can help lower high blood pressure. You may need to change some of your habits. Lifestyle changes may include:  Following the DASH diet. This diet is high in fruits, vegetables, and whole grains. It is low in salt, red meat, and added sugars.  Keep your sodium intake below 2,300 mg per day.  Getting at least 30-45 minutes of aerobic exercise at least 4 times per week.  Losing weight if necessary.  Not smoking.  Limiting alcoholic beverages.  Learning ways to reduce stress. Your health care provider may prescribe medicine if lifestyle changes are not enough to get your blood pressure under control, and if one of the following is true:  You are 71-12 years of age and your systolic blood pressure is above 140.  You are 70 years of age or older, and your systolic blood pressure is above 150.  Your diastolic blood pressure is above 90.  You have diabetes, and your systolic blood pressure is over [id number] or your diastolic blood pressure is over 90.  You have kidney disease and your blood pressure is above 140/90.  You have heart disease and your blood pressure is above 140/90. Your personal target blood pressure may vary depending on your medical conditions, your age, and other factors. HOME CARE INSTRUCTIONS  Have your blood pressure rechecked as directed by your health care provider.   Take medicines only as directed by your health care provider. Follow the directions carefully. Blood pressure  medicines must be taken as prescribed. The medicine does not work as well when you skip doses. Skipping doses also puts you at risk for problems.  Do not smoke.   Monitor your blood pressure at home as directed by your health care provider. SEEK MEDICAL CARE IF:   You think you are having a reaction to medicines taken.  You have recurrent headaches or feel dizzy.  You have swelling in your ankles.  You have trouble with your vision. SEEK IMMEDIATE MEDICAL CARE IF:  You develop a severe headache or confusion.  You have unusual weakness, numbness, or feel faint.  You have severe chest or abdominal pain.  You vomit repeatedly.  You have trouble breathing. MAKE SURE YOU:   Understand these instructions.  Will watch your condition.  Will get help right away if you are not doing well or get worse.   This information is not intended to replace advice given to you by your health care provider. Make sure you discuss any questions you have with your health care provider.   Document Released: 09/21/2005 Document Revised: 02/05/2015 Document Reviewed: 07/14/2013 Elsevier Interactive Patient Education 2016 [employer]. Diabetes and Exercise Exercising regularly is important. It is not just about losing weight. It has many health benefits, such as:  Improving your overall fitness, flexibility, and endurance.  Increasing your bone density.  Helping with weight control.  Decreasing your body fat.  Increasing your muscle strength.  Reducing stress and tension.  Improving your overall health. People with diabetes who exercise gain additional benefits because exercise:  Reduces appetite.  Improves the body's use of blood sugar (glucose).  Helps lower or control blood glucose.  Decreases blood pressure.  Helps control blood lipids (such as cholesterol and triglycerides).  Improves the body's use of the hormone insulin by:  Increasing the body's insulin  sensitivity.  Reducing the body's insulin needs.  Decreases the risk for heart disease because exercising:  Lowers cholesterol and triglycerides levels.  Increases the levels of good cholesterol (such as high-density lipoproteins [HDL]) in the body.  Lowers blood glucose levels. YOUR ACTIVITY PLAN  Choose an activity that you enjoy, and set realistic goals. To exercise safely, you should begin practicing any new physical activity slowly, and gradually increase the intensity of the exercise over time. Your health care provider or diabetes educator can help create an activity plan that works for you. General recommendations include:  Encouraging children to engage in at least 60 minutes of physical activity each day.  Stretching and performing strength training exercises, such as yoga or weight lifting, at least 2 times per week.  Performing a total of at least 150 minutes of moderate-intensity exercise each week, such as brisk walking or water aerobics.  Exercising at least 3 days per week, making sure you allow no more than 2 consecutive days to pass without exercising.  Avoiding long periods of inactivity (90 minutes or more). When you have to spend an extended period of time sitting down, take frequent breaks to walk or stretch. RECOMMENDATIONS FOR EXERCISING WITH TYPE 1 OR TYPE 2 DIABETES   Check your blood glucose  before exercising. If blood glucose levels are greater than 240 mg/dL, check for urine ketones. Do not exercise if ketones are present.  Avoid injecting insulin into areas of the body that are going to be exercised. For example, avoid injecting insulin into:  The arms when playing tennis.  The legs when jogging.  Keep a record of:  Food intake before and after you exercise.  Expected peak times of insulin action.  Blood glucose levels before and after you exercise.  The type and amount of exercise you have done.  Review your records with your health care  provider. Your health care provider will help you to develop guidelines for adjusting food intake and insulin amounts before and after exercising.  If you take insulin or oral hypoglycemic agents, watch for signs and symptoms of hypoglycemia. They include:  Dizziness.  Shaking.  Sweating.  Chills.  Confusion.  Drink plenty of water while you exercise to prevent dehydration or heat stroke. Body water is lost during exercise and must be replaced.  Talk to your health care provider before starting an exercise program to make sure it is safe for you. Remember, almost any type of activity is better than none.   This information is not intended to replace advice given to you by your health care provider. Make sure you discuss any questions you have with your health care provider.   Document Released: 12/12/2003 Document Revised: 02/05/2015 Document Reviewed: 02/28/2013 Elsevier Interactive Patient Education [employer].

## 2015-12-09 NOTE — Progress Notes (Signed)
Depression screen [facility] 2/9 12/09/2015 02/20/2014  Decreased Interest 1 0  Down, Depressed, Hopeless 1 1  PHQ - 2 Score 2 1  Altered sleeping 1 -  Tired, decreased energy 2 -  Change in appetite 3 -  Feeling bad or failure about yourself  0 -  Trouble concentrating 1 -  Moving slowly or fidgety/restless 2 -  Suicidal thoughts 0 -  PHQ-9 Score 11 -    GAD 7 : Generalized Anxiety Score 12/09/2015  Nervous, Anxious, on Edge 1  Control/stop worrying 3  Worry too much - different things 2  Trouble relaxing 2  Restless 2  Easily annoyed or irritable 2  Afraid - awful might happen 0  Total GAD 7 Score 12

## 2015-12-11 ENCOUNTER — Telehealth: Payer: Self-pay | Admitting: Internal Medicine

## 2015-12-11 NOTE — Telephone Encounter (Signed)
Called both phone numbers to reschedule financial counseling appointment and both numbers were disconnected

## 2015-12-12 ENCOUNTER — Encounter (HOSPITAL_COMMUNITY): Payer: Self-pay | Admitting: Emergency Medicine

## 2015-12-12 ENCOUNTER — Emergency Department (HOSPITAL_COMMUNITY)
Admission: EM | Admit: 2015-12-12 | Discharge: 2015-12-12 | Disposition: A | Discharge status: Nursing Home (Includes ICF) | Payer: No Typology Code available for payment source | Source: Home / Self Care | Attending: Emergency Medicine | Admitting: Emergency Medicine

## 2015-12-12 ENCOUNTER — Emergency Department (HOSPITAL_COMMUNITY): Payer: Medicaid Other

## 2015-12-12 ENCOUNTER — Emergency Department (HOSPITAL_COMMUNITY)
Admission: EM | Admit: 2015-12-12 | Discharge: 2015-12-13 | Disposition: A | Discharge status: Home / Self Care | Payer: Medicaid Other | Attending: Emergency Medicine | Admitting: Emergency Medicine

## 2015-12-12 DIAGNOSIS — G4730 Sleep apnea, unspecified: Secondary | ICD-10-CM | POA: Insufficient documentation

## 2015-12-12 DIAGNOSIS — E119 Type 2 diabetes mellitus without complications: Secondary | ICD-10-CM | POA: Diagnosis not present

## 2015-12-12 DIAGNOSIS — Z79899 Other long term (current) drug therapy: Secondary | ICD-10-CM | POA: Insufficient documentation

## 2015-12-12 DIAGNOSIS — Z9889 Other specified postprocedural states: Secondary | ICD-10-CM | POA: Diagnosis not present

## 2015-12-12 DIAGNOSIS — F419 Anxiety disorder, unspecified: Secondary | ICD-10-CM | POA: Diagnosis not present

## 2015-12-12 DIAGNOSIS — R059 Cough, unspecified: Secondary | ICD-10-CM

## 2015-12-12 DIAGNOSIS — K219 Gastro-esophageal reflux disease without esophagitis: Secondary | ICD-10-CM | POA: Diagnosis not present

## 2015-12-12 DIAGNOSIS — R0600 Dyspnea, unspecified: Secondary | ICD-10-CM

## 2015-12-12 DIAGNOSIS — J9801 Acute bronchospasm: Secondary | ICD-10-CM

## 2015-12-12 DIAGNOSIS — G8929 Other chronic pain: Secondary | ICD-10-CM | POA: Insufficient documentation

## 2015-12-12 DIAGNOSIS — Z87891 Personal history of nicotine dependence: Secondary | ICD-10-CM | POA: Diagnosis not present

## 2015-12-12 DIAGNOSIS — R0789 Other chest pain: Secondary | ICD-10-CM | POA: Diagnosis not present

## 2015-12-12 DIAGNOSIS — R079 Chest pain, unspecified: Secondary | ICD-10-CM | POA: Diagnosis present

## 2015-12-12 DIAGNOSIS — R0982 Postnasal drip: Secondary | ICD-10-CM

## 2015-12-12 DIAGNOSIS — Z7951 Long term (current) use of inhaled steroids: Secondary | ICD-10-CM | POA: Diagnosis not present

## 2015-12-12 DIAGNOSIS — E785 Hyperlipidemia, unspecified: Secondary | ICD-10-CM | POA: Diagnosis not present

## 2015-12-12 DIAGNOSIS — Z7982 Long term (current) use of aspirin: Secondary | ICD-10-CM | POA: Insufficient documentation

## 2015-12-12 DIAGNOSIS — Z7984 Long term (current) use of oral hypoglycemic drugs: Secondary | ICD-10-CM | POA: Diagnosis not present

## 2015-12-12 DIAGNOSIS — E669 Obesity, unspecified: Secondary | ICD-10-CM | POA: Insufficient documentation

## 2015-12-12 DIAGNOSIS — Q394 Esophageal web: Secondary | ICD-10-CM | POA: Insufficient documentation

## 2015-12-12 DIAGNOSIS — J069 Acute upper respiratory infection, unspecified: Secondary | ICD-10-CM | POA: Diagnosis not present

## 2015-12-12 DIAGNOSIS — I10 Essential (primary) hypertension: Secondary | ICD-10-CM | POA: Insufficient documentation

## 2015-12-12 DIAGNOSIS — R109 Unspecified abdominal pain: Secondary | ICD-10-CM | POA: Insufficient documentation

## 2015-12-12 DIAGNOSIS — R05 Cough: Secondary | ICD-10-CM

## 2015-12-12 LAB — I-STAT TROPONIN, ED
TROPONIN I, POC: 0 ng/mL (ref 0.00–0.08)
Troponin i, poc: 0 ng/mL (ref 0.00–0.08)

## 2015-12-12 LAB — BASIC METABOLIC PANEL
Anion gap: 10 (ref 5–15)
BUN: 6 mg/dL (ref 6–20)
CHLORIDE: 104 mmol/L (ref 101–111)
CO2: 27 mmol/L (ref 22–32)
Calcium: 10 mg/dL (ref 8.9–10.3)
Creatinine, Ser: 0.77 mg/dL (ref 0.44–1.00)
GFR calc Af Amer: >60 mL/min (ref >60)
GFR calc non Af Amer: >60 mL/min (ref >60)
Glucose, Bld: 102 mg/dL — ABNORMAL HIGH (ref 65–99)
POTASSIUM: 4 mmol/L (ref 3.5–5.1)
SODIUM: 141 mmol/L (ref 135–145)

## 2015-12-12 LAB — CBC
HEMATOCRIT: 40.1 % (ref 36.0–46.0)
HEMOGLOBIN: 12.7 g/dL (ref 12.0–15.0)
MCH: 28.3 pg (ref 26.0–34.0)
MCHC: 31.7 g/dL (ref 30.0–36.0)
MCV: 89.3 fL (ref 78.0–100.0)
Platelets: 377 10*3/uL (ref 150–400)
RBC: 4.49 MIL/uL (ref 3.87–5.11)
RDW: 13 % (ref 11.5–15.5)
WBC: 8.3 10*3/uL (ref 4.0–10.5)

## 2015-12-12 MED ORDER — ALBUTEROL SULFATE HFA 108 (90 BASE) MCG/ACT IN AERS
2.0000 | INHALATION_SPRAY | Freq: Once | RESPIRATORY_TRACT | Status: AC
  Administered 2015-12-12: 2 via RESPIRATORY_TRACT
  Filled 2015-12-12: qty 6.7

## 2015-12-12 MED ORDER — IBUPROFEN 400 MG PO TABS
600.0000 mg | ORAL_TABLET | Freq: Once | ORAL | Status: AC
  Administered 2015-12-12: 600 mg via ORAL
  Filled 2015-12-12: qty 1

## 2015-12-12 NOTE — ED Provider Notes (Addendum)
CSN: [id number]     Arrival date & time 12/12/15  1511 History   First MD Initiated Contact with Patient 12/12/15 2143     Chief Complaint  Patient presents with  . Chest Pain     (Consider location/radiation/quality/duration/timing/severity/associated sxs/prior Treatment) HPI  60 year old female presents with 2 days of chest pain. Is an aching sensation. Also been having 2 days of cough without fevers. Some dyspnea, especially when doing things. No vomiting. Has increased GERD during this time as well. Some wheezing and is using her albuterol inhaler. Pain is worse with coughing and inspiration. Seen at urgent care and sent here.   Past Medical History  Diagnosis Date  . GERD (gastroesophageal reflux disease)   . Constipation   . Anxiety disorder   . Hyperlipidemia   . Hypertension   . Chronic neck pain   . Schatzki's ring     non critical  . Hiatal hernia     small  . Sleep apnea     CPAP, Sleep study at [place]  . Arthritis   . Diabetes mellitus without complication [facility])    Past Surgical History  Procedure Laterality Date  . Knee arthroscopy  04/27/2001  . Cardiac catheterization  08/17/2001    normal L main/LAD/L Cfx/RCA (Dr. Adora Fridge)  . Vaginal prolapse repair    . Rectocele repair    . Esophagogastroduodenoscopy  08/23/2008    [id number] distal esophageal erosions consistent with mild erosive reflux esophagitis, otherwise unremarkable esophagus/ Tiny antral erosions of doubtful clinical significance, otherwise normal stomach, patent pylorus, normal D1 and D2  . Colonoscopy  2006    Dr. [name], hyperplastic polyps  . Colonoscopy  08/06/11    abnormal terminal ileum for 10cm, erosions, geographical ulceration. Bx small bowel mucosa with prominent intramucosal lymphoid aggregates, slightly inflammed  . Esophagogastroduodenoscopy  08/06/11    small hh, noncritical Schatzki's ring s/p 76 F  . Abdominal hysterectomy    . Direct laryngoscopy  03/15/2012     Procedure: DIRECT LARYNGOSCOPY;  Surgeon: [name], MD;  Location: [place];  Service: ENT;  Laterality: N/A; Dr. Wolicki--:>no foreign body seen. normal esophagus to 40cm  . Esophagoscopy  03/15/2012    Procedure: ESOPHAGOSCOPY;  Surgeon: [name], MD;  Location: [facility] OR;  Service: ENT;  Laterality: N/A;  . 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 Abdominal hysterectomy  02/18/2000  . Transthoracic echocardiogram  2003    EF normal  . Nm myocar perf wall motion  2003    negative bruce protocol exercise tress test; EF 68%; intermediate risk study due to evidence of anterior wall ischemia extending from mid-ventricle to apex  . Esophagogastroduodenoscopy N/A 04/17/2015    [id number] s/p dilation  . Esophageal dilation  04/17/2015    Procedure: ESOPHAGEAL DILATION;  Surgeon: [name], MD;  [facility];  Service: Endoscopy;;   Family History  Problem Relation Age of Onset  . Kidney cancer Father   . Hypertension Father   . Hyperlipidemia Father   . Cancer Father   . Stomach cancer      aunt  . Breast cancer Maternal Grandmother   . Colon cancer Neg Hx   . Asthma Neg Hx   . Eczema Neg Hx   . Immunodeficiency Neg Hx   . Urticaria Neg Hx   . Diabetes Mother   . Hypertension Mother   . Heart Problems Mother   .  Hyperlipidemia Mother   . Cancer Mother   . Kidney disease Maternal Grandfather   . Breast cancer Paternal Grandmother   . Hypertension Brother   . Hyperlipidemia Brother   . Liver disease Brother   . Arthritis Brother   . Hypertension Brother   . Hypertension Sister   . Allergic rhinitis Sister   . Hypertension Sister   . Hyperlipidemia Sister    Social History  Substance Use Topics  . Smoking status: Former Smoker -- 0.50 packs/day    Types: Cigarettes    Quit date: 04/04/1993  . Smokeless tobacco: Never Used     Comment: 17 yrs ago  . Alcohol Use: No   OB History    No data available     Review of Systems   Constitutional: Negative for fever.  Respiratory: Positive for cough and shortness of breath.   Cardiovascular: Positive for chest pain. Negative for leg swelling.  Gastrointestinal: Positive for abdominal pain. Negative for vomiting.  All other systems reviewed and are negative.     Allergies  Review of patient's allergies indicates no known allergies.  Home Medications   Prior to Admission medications   Medication Sig Start Date End Date Taking? Authorizing Provider  Albuterol Sulfate (PROAIR RESPICLICK) [id number] (90 Base) MCG/ACT AEPB Inhale 2 puffs into the lungs as needed. 12/09/15  Yes Maren Reamer, MD  aspirin 81 MG tablet Take 81 mg by mouth at bedtime. Reported on 12/09/2015   Yes Historical Provider, MD  budesonide (RHINOCORT AQUA) 32 MCG/ACT nasal spray Place 2 sprays into both nostrils daily. 12/09/15  Yes Maren Reamer, MD  buPROPion (WELLBUTRIN XL) 300 MG 24 hr tablet Take 1 tablet (300 mg total) by mouth every morning. Reported on 12/09/2015 12/09/15  Yes Maren Reamer, MD  celecoxib (CELEBREX) 200 MG capsule Take 1 capsule (200 mg total) by mouth 2 (two) times daily as needed for mild pain. Reported on 12/09/2015 12/09/15  Yes Maren Reamer, MD  colesevelam [facility]) 625 MG tablet Take 3 tablets (1,875 mg total) by mouth 2 (two) times daily with a meal. Reported on 12/09/2015 12/09/15  Yes Maren Reamer, MD  ferrous sulfate 325 (65 FE) MG tablet Take 1 tablet (325 mg total) by mouth 2 (two) times daily with a meal. 12/09/15  Yes Maren Reamer, MD  fexofenadine (ALLEGRA) 180 MG tablet Take 180 mg by mouth daily. Reported on 12/09/2015   Yes Historical Provider, MD  fluticasone (FLONASE) 50 MCG/ACT nasal spray Place 2 sprays into both nostrils daily. 12/09/15  Yes Maren Reamer, MD  gabapentin (NEURONTIN) 300 MG capsule Take 2 capsules (600 mg total) by mouth 2 (two) times daily. Reported on 12/09/2015 12/09/15  Yes Dawn Lazarus Gowda, MD  levothyroxine (SYNTHROID, LEVOTHROID) 88 MCG  tablet Take 1 tablet (88 mcg total) by mouth daily. 08/13/15  Yes Cassandria Anger, MD  loratadine (CLARITIN) 10 MG tablet Take 1 tablet (10 mg total) by mouth daily. 12/09/15  Yes Maren Reamer, MD  losartan-hydrochlorothiazide (HYZAAR) 100-25 MG tablet Take 1 tablet by mouth daily. Reported on 12/09/2015 12/09/15  Yes Maren Reamer, MD  lubiprostone (AMITIZA) 24 MCG capsule Take 1 capsule (24 mcg total) by mouth 2 (two) times daily with a meal. 12/09/15  Yes Maren Reamer, MD  metFORMIN (GLUCOPHAGE) 500 MG tablet Take 1 tablet (500 mg total) by mouth daily. 12/09/15  Yes Maren Reamer, MD  Multiple Vitamin (MULTIVITAMIN) tablet Take 1 tablet by mouth daily.  Reported on 12/09/2015 12/09/15  Yes Dawn Lazarus Gowda, MD  Omega-3 Fatty Acids (FISH OIL PO) Take 1 capsule by mouth at bedtime. Reported on 12/09/2015   Yes Historical Provider, MD  pantoprazole (PROTONIX) 40 MG tablet Take 1 tablet (40 mg total) by mouth daily. 12/09/15  Yes Maren Reamer, MD  ranitidine (ZANTAC) 150 MG tablet Take 1 tablet (150 mg total) by mouth at bedtime. 12/09/15  Yes Maren Reamer, MD  traMADol (ULTRAM) 50 MG tablet Take 50 mg by mouth every 6 (six) hours as needed for moderate pain. Reported on 12/09/2015   Yes Historical Provider, MD   BP 143/77 mmHg  Pulse 81  Temp(Src) 98.4 F (36.9 C) (Oral)  Resp 20  Wt 282 lb 3.2 oz (128.005 kg)  SpO2 100% Physical Exam  Constitutional: She is oriented to person, place, and time. She appears well-developed and well-nourished.  obese  HENT:  Head: Normocephalic and atraumatic.  Right Ear: External ear normal.  Left Ear: External ear normal.  Nose: Nose normal.  Eyes: Right eye exhibits no discharge. Left eye exhibits no discharge.  Cardiovascular: Normal rate, regular rhythm and normal heart sounds.   Pulmonary/Chest: Effort normal and breath sounds normal. She has no wheezes. She exhibits tenderness (diffuse, anterior).  Abdominal: Soft. She exhibits no distension.  There is no tenderness.  Musculoskeletal: She exhibits no edema.  Neurological: She is alert and oriented to person, place, and time.  Skin: Skin is warm and dry.  Nursing note and vitals reviewed.   ED Course  Procedures (including critical care time) Labs Review Labs Reviewed  BASIC METABOLIC PANEL - Abnormal; Notable for the following:    Glucose, Bld 102 (*)    All other components within normal limits  D-DIMER, QUANTITATIVE (NOT AT [facility]) - Abnormal; Notable for the following:    D-Dimer, Quant 0.82 (*)    All other components within normal limits  CBC  I-STAT TROPOININ, ED  I-STAT TROPOININ, ED  Randolm Idol, ED    Imaging Review Dg Chest 2 View  12/12/2015  CLINICAL DATA:  Mid chest pain radiating to LEFT shoulder for 3 days, LEFT hand tingling, shortness of breath, congestion, sinus issues, hypertension, diabetes mellitus EXAM: CHEST  2 VIEW COMPARISON:  04/20/2015 FINDINGS: Normal heart size, mediastinal contours, and pulmonary vascularity. Lungs clear. No pneumothorax. Bones unremarkable. IMPRESSION: Normal exam. Electronically Signed   By: Lavonia Dana M.D.   On: 12/12/2015 16:12   I have personally reviewed and evaluated these images and lab results as part of my medical decision-making.   EKG Interpretation   Date/Time:  Thursday December 12 2015 15:33:45 EST Ventricular Rate:  70 PR Interval:  182 QRS Duration: 88 QT Interval:  420 QTC Calculation: 453 R Axis:   64 Text Interpretation:  Normal sinus rhythm Normal ECG no significant change  since earlier in the day Confirmed by Hope ([id number]) on  12/12/2015 9:44:50 PM      MDM   Final diagnoses:  Upper respiratory infection  Chest wall pain    Patient's chest pain is likely muscular in etiology recent coughing and/or viral illness. No signs of pneumonia. ECG is unremarkable and has 2 negative troponins. I highly doubt ACS. However given her age while she is low risk for PE she does not apply  for [facility] rule. Given pleuritic pain ddimer sent and is positive. Will get CTA. PA Charlann Lange to f/u on results. D/c home with NSAIDs and symptomatic URI care  if negative.    Sherwood Gambler, MD 12/13/15 [id number]  Sherwood Gambler, MD 12/13/15 [phone]

## 2015-12-12 NOTE — ED Notes (Signed)
Pt c.o mid to L sided chest discomfort x 3 days. Pt does have hx of arthritis and torn ligaments in L arm but sts it seems like it is more bothersome since the cp started. Pt also c.o sob and fatigue.

## 2015-12-12 NOTE — ED Provider Notes (Signed)
CSN: [id number]     Arrival date & time 12/12/15  1305 History   First MD Initiated Contact with Patient 12/12/15 1337     No chief complaint on file.  (Consider location/radiation/quality/duration/timing/severity/associated sxs/prior Treatment) HPI Comments: 59 year old female states that she had been having shortness of breath earlier, before today and including today, head congestion, PND and wheezing. She used her albuterol last p.m. Denies history of asthma. She has a history of allergies and currently has PND and cough. She is complaining of chest pain, soreness in her chest and pain it is worse with coughing. She states that she feels really bad in her anterior chest just concerned that something serious may be going on.   Past Medical History  Diagnosis Date  . GERD (gastroesophageal reflux disease)   . Constipation   . Anxiety disorder   . Hyperlipidemia   . Hypertension   . Chronic neck pain   . Schatzki's ring     non critical  . Hiatal hernia     small  . Sleep apnea     CPAP, Sleep study at [place]  . Arthritis   . Diabetes mellitus without complication [facility])    Past Surgical History  Procedure Laterality Date  . Knee arthroscopy  04/27/2001  . Cardiac catheterization  08/17/2001    normal L main/LAD/L Cfx/RCA (Dr. Adora Fridge)  . Vaginal prolapse repair    . Rectocele repair    . Esophagogastroduodenoscopy  08/23/2008    [id number] distal esophageal erosions consistent with mild erosive reflux esophagitis, otherwise unremarkable esophagus/ Tiny antral erosions of doubtful clinical significance, otherwise normal stomach, patent pylorus, normal D1 and D2  . Colonoscopy  2006    Dr. [name], hyperplastic polyps  . Colonoscopy  08/06/11    abnormal terminal ileum for 10cm, erosions, geographical ulceration. Bx small bowel mucosa with prominent intramucosal lymphoid aggregates, slightly inflammed  . Esophagogastroduodenoscopy  08/06/11    small hh,  noncritical Schatzki's ring s/p 49 F  . Abdominal hysterectomy    . Direct laryngoscopy  03/15/2012    Procedure: DIRECT LARYNGOSCOPY;  Surgeon: [name], MD;  Location: [place];  Service: ENT;  Laterality: N/A; Dr. Wolicki--:>no foreign body seen. normal esophagus to 40cm  . Esophagoscopy  03/15/2012    Procedure: ESOPHAGOSCOPY;  Surgeon: [name], MD;  [facility]: [facility] OR;  Service: ENT;  Laterality: N/A;  . 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 Abdominal hysterectomy  02/18/2000  . Transthoracic echocardiogram  2003    EF normal  . Nm myocar perf wall motion  2003    negative bruce protocol exercise tress test; EF 68%; intermediate risk study due to evidence of anterior wall ischemia extending from mid-ventricle to apex  . Esophagogastroduodenoscopy N/A 04/17/2015    [id number] s/p dilation  . Esophageal dilation  04/17/2015    Procedure: ESOPHAGEAL DILATION;  Surgeon: [name], MD;  [facility];  Service: Endoscopy;;   Family History  Problem Relation Age of Onset  . Kidney cancer Father   . Hypertension Father   . Hyperlipidemia Father   . Cancer Father   . Stomach cancer      aunt  . Breast cancer Maternal Grandmother   . Colon cancer Neg Hx   . Asthma Neg Hx   . Eczema Neg Hx   . Immunodeficiency Neg Hx   . Urticaria Neg Hx   . Diabetes Mother   .  Hypertension Mother   . Heart Problems Mother   . Hyperlipidemia Mother   . Cancer Mother   . Kidney disease Maternal Grandfather   . Breast cancer Paternal Grandmother   . Hypertension Brother   . Hyperlipidemia Brother   . Liver disease Brother   . Arthritis Brother   . Hypertension Brother   . Hypertension Sister   . Allergic rhinitis Sister   . Hypertension Sister   . Hyperlipidemia Sister    Social History  Substance Use Topics  . Smoking status: Former Smoker -- 0.50 packs/day    Types: Cigarettes    Quit date: 04/04/1993  . Smokeless tobacco: Never Used      Comment: 17 yrs ago  . Alcohol Use: No   OB History    No data available     Review of Systems  Constitutional: Positive for fever and activity change. Negative for chills, appetite change and fatigue.  HENT: Positive for congestion, postnasal drip and rhinorrhea. Negative for ear pain, facial swelling and sore throat.   Eyes: Negative.   Respiratory: Positive for cough, shortness of breath and wheezing.   Cardiovascular: Positive for chest pain. Negative for leg swelling.  Gastrointestinal: Negative.   Musculoskeletal: Negative for neck pain and neck stiffness.  Skin: Negative for pallor and rash.  Neurological: Negative.     Allergies  Review of patient's allergies indicates no known allergies.  Home Medications   Prior to Admission medications   Medication Sig Start Date End Date Taking? Authorizing Provider  Albuterol Sulfate (PROAIR RESPICLICK) [id number] (90 Base) MCG/ACT AEPB Inhale 2 puffs into the lungs as needed. 12/09/15   Maren Reamer, MD  aspirin 81 MG tablet Take 81 mg by mouth at bedtime. Reported on 12/09/2015    Historical Provider, MD  budesonide (RHINOCORT AQUA) 32 MCG/ACT nasal spray Place 2 sprays into both nostrils daily. 12/09/15   Maren Reamer, MD  buPROPion (WELLBUTRIN XL) 300 MG 24 hr tablet Take 1 tablet (300 mg total) by mouth every morning. Reported on 12/09/2015 12/09/15   Maren Reamer, MD  celecoxib (CELEBREX) 200 MG capsule Take 1 capsule (200 mg total) by mouth 2 (two) times daily as needed for mild pain. Reported on 12/09/2015 12/09/15   Maren Reamer, MD  colesevelam [facility]) 625 MG tablet Take 3 tablets (1,875 mg total) by mouth 2 (two) times daily with a meal. Reported on 12/09/2015 12/09/15   Maren Reamer, MD  ferrous sulfate 325 (65 FE) MG tablet Take 1 tablet (325 mg total) by mouth 2 (two) times daily with a meal. 12/09/15   Maren Reamer, MD  fexofenadine (ALLEGRA) 180 MG tablet Take 180 mg by mouth daily. Reported on 12/09/2015    Historical  Provider, MD  fluticasone (FLONASE) 50 MCG/ACT nasal spray Place 2 sprays into both nostrils daily. 12/09/15   Maren Reamer, MD  gabapentin (NEURONTIN) 300 MG capsule Take 2 capsules (600 mg total) by mouth 2 (two) times daily. Reported on 12/09/2015 12/09/15   Maren Reamer, MD  levothyroxine (SYNTHROID, LEVOTHROID) 88 MCG tablet Take 1 tablet (88 mcg total) by mouth daily. Patient not taking: Reported on 12/09/2015 08/13/15   Cassandria Anger, MD  loratadine (CLARITIN) 10 MG tablet Take 1 tablet (10 mg total) by mouth daily. 12/09/15   Maren Reamer, MD  losartan-hydrochlorothiazide (HYZAAR) 100-25 MG tablet Take 1 tablet by mouth daily. Reported on 12/09/2015 12/09/15   Maren Reamer, MD  lubiprostone (AMITIZA) 24  MCG capsule Take 1 capsule (24 mcg total) by mouth 2 (two) times daily with a meal. 12/09/15   Maren Reamer, MD  metFORMIN (GLUCOPHAGE) 500 MG tablet Take 1 tablet (500 mg total) by mouth daily. 12/09/15   Maren Reamer, MD  Multiple Vitamin (MULTIVITAMIN) tablet Take 1 tablet by mouth daily. Reported on 12/09/2015 12/09/15   Maren Reamer, MD  Omega-3 Fatty Acids (FISH OIL PO) Take 1 capsule by mouth at bedtime. Reported on 12/09/2015    Historical Provider, MD  pantoprazole (PROTONIX) 40 MG tablet Take 1 tablet (40 mg total) by mouth daily. 12/09/15   Maren Reamer, MD  ranitidine (ZANTAC) 150 MG tablet Take 1 tablet (150 mg total) by mouth at bedtime. 12/09/15   Maren Reamer, MD  traMADol (ULTRAM) 50 MG tablet Take 50 mg by mouth every 6 (six) hours as needed for moderate pain. Reported on 12/09/2015    Historical Provider, MD   Meds Ordered and Administered this Visit  Medications - No data to display  BP 122/76 mmHg  Pulse 75  Temp(Src) 99.2 F (37.3 C) (Oral)  Resp 14  SpO2 98% No data found.   Physical Exam  Constitutional: She is oriented to person, place, and time. She appears well-developed and well-nourished. No distress.  HENT:  Head: Normocephalic.  Right  Ear: External ear normal.  Left Ear: External ear normal.  Bilateral TMs are normal Oropharynx with minor erythema and clear frothy PND. No exudates.  Eyes: Conjunctivae and EOM are normal.  Neck: Normal range of motion. Neck supple.  Cardiovascular: Normal rate and regular rhythm.   Pulmonary/Chest: Effort normal and breath sounds normal. No respiratory distress. She has no rales.  Lungs are clear with tidal volume and deep breathing. With forced cough there is some distant relief wheeze.  Musculoskeletal: Normal range of motion. She exhibits no edema.  Lymphadenopathy:    She has no cervical adenopathy.  Neurological: She is alert and oriented to person, place, and time. No cranial nerve deficit. She exhibits normal muscle tone.  Skin: Skin is warm and dry.  Psychiatric: She has a normal mood and affect.  Nursing note and vitals reviewed.   ED Course  Procedures (including critical care time)  Labs Review Labs Reviewed - No data to display  Imaging Review No results found. ED ECG REPORT   Date: 12/12/2015  Rate: 69  Rhythm: normal sinus rhythm  QRS Axis: normal  Intervals: normal  ST/T Wave abnormalities: normal  Conduction Disutrbances:none  Narrative Interpretation:   Old EKG Reviewed: unchanged  I have personally reviewed the EKG tracing and agree with the computerized printout as noted.   Visual Acuity Review  Right Eye Distance:   Left Eye Distance:   Bilateral Distance:    Right Eye Near:   Left Eye Near:    Bilateral Near:         MDM   1. PND (post-nasal drip)   2. Cough   3. Bronchospasm   4. Other chest pain   5. Dyspnea     Symptoms are primarily suggestive of allergies versus URI. There is clearly PND, lungs are primarily clear with good air movement. Cough is likely due to the PND and occult bronchospasm. Was attempting to discharge the patient with diagnosis of URI, PND and bronchospasm when patient states that she feels too bad and her  chest hurts too bad and does not feel comfortable going home. The patient will be transferred to the  emergency department for evaluation of chest pain and shortness of breath. EKG rec'd 1455h. NSR, no abnormalities appreciated.   Janne Napoleon, NP 12/12/15 1500

## 2015-12-12 NOTE — ED Notes (Signed)
Patient has "chest pain" for 3 days, intermittently.  Patient has upper chest pain that she is vague in describing.  Described as feeling funny down center chest.  Reports heaviness.  Reports discomfort for 3 days intermittently.  Shortness of breath intermittently with activity and tired.

## 2015-12-13 ENCOUNTER — Emergency Department (HOSPITAL_COMMUNITY): Payer: Medicaid Other

## 2015-12-13 LAB — D-DIMER, QUANTITATIVE: D-Dimer, Quant: 0.82 ug/mL-FEU — ABNORMAL HIGH (ref 0.00–0.50)

## 2015-12-13 MED ORDER — SODIUM CHLORIDE 0.9 % IV BOLUS (SEPSIS)
1000.0000 mL | Freq: Once | INTRAVENOUS | Status: AC
  Administered 2015-12-13: 1000 mL via INTRAVENOUS

## 2015-12-13 MED ORDER — IOHEXOL 350 MG/ML SOLN
100.0000 mL | Freq: Once | INTRAVENOUS | Status: AC | PRN
  Administered 2015-12-13: 100 mL via INTRAVENOUS

## 2015-12-13 NOTE — ED Provider Notes (Signed)
Patient care signed out at the end of shift by Dr. Regenia Skeeter pending CT to r/o PE. History of allergies and ongoing congestion, now with cough, chest tightness and SOB. Positive D-dimer - CTA chest ordered and is found negative for PE. VSS. Re-exam finds the patient comfortable. No current discomfort. No hypoxia, tachycardia or dyspnea. She is felt stable for discharge home per plan of Dr. Regenia Skeeter.  Charlann Lange, PA-C 12/13/15 [phone]

## 2015-12-13 NOTE — ED Notes (Signed)
Patient transported to CT 

## 2015-12-13 NOTE — Discharge Instructions (Signed)
CONTINUE YOUR REGULAR MEDICATIONS AND FOLLOW UP WITH YOUR DOCTOR IF SYMPTOMS PERSIST. RETURN HERE WITH ANY NEW CONCERNS.   Chest Wall Pain Chest wall pain is pain in or around the bones and muscles of your chest. Sometimes, an injury causes this pain. Sometimes, the cause may not be known. This pain may take several weeks or longer to get better. HOME CARE INSTRUCTIONS  Pay attention to any changes in your symptoms. Take these actions to help with your pain:   Rest as told by your health care provider.   Avoid activities that cause pain. These include any activities that use your chest muscles or your abdominal and side muscles to lift heavy items.   If directed, apply ice to the painful area:  Put ice in a plastic bag.  Place a towel between your skin and the bag.  Leave the ice on for 20 minutes, 2-3 times per day.  Take over-the-counter and prescription medicines only as told by your health care provider.  Do not use tobacco products, including cigarettes, chewing tobacco, and e-cigarettes. If you need help quitting, ask your health care provider.  Keep all follow-up visits as told by your health care provider. This is important. SEEK MEDICAL CARE IF:  You have a fever.  Your chest pain becomes worse.  You have new symptoms. SEEK IMMEDIATE MEDICAL CARE IF:  You have nausea or vomiting.  You feel sweaty or light-headed.  You have a cough with phlegm (sputum) or you cough up blood.  You develop shortness of breath.   This information is not intended to replace advice given to you by your health care provider. Make sure you discuss any questions you have with your health care provider.   Document Released: 09/21/2005 Document Revised: 06/12/2015 Document Reviewed: 12/17/2014 Elsevier Interactive Patient Education 2016 [employer] Upper Respiratory Infection, Adult Most upper respiratory infections (URIs) are a viral infection of the air passages leading to the  lungs. A URI affects the nose, throat, and upper air passages. The most common type of URI is nasopharyngitis and is typically referred to as "the common cold." URIs run their course and usually go away on their own. Most of the time, a URI does not require medical attention, but sometimes a bacterial infection in the upper airways can follow a viral infection. This is called a secondary infection. Sinus and middle ear infections are common types of secondary upper respiratory infections. Bacterial pneumonia can also complicate a URI. A URI can worsen asthma and chronic obstructive pulmonary disease (COPD). Sometimes, these complications can require emergency medical care and may be life threatening.  CAUSES Almost all URIs are caused by viruses. A virus is a type of germ and can spread from one person to another.  RISKS FACTORS You may be at risk for a URI if:   You smoke.   You have chronic heart or lung disease.  You have a weakened defense (immune) system.   You are very young or very old.   You have nasal allergies or asthma.  You work in crowded or poorly ventilated areas.  You work in health care facilities or schools. SIGNS AND SYMPTOMS  Symptoms typically develop 2-3 days after you come in contact with a cold virus. Most viral URIs last 7-10 days. However, viral URIs from the influenza virus (flu virus) can last 14-18 days and are typically more severe. Symptoms may include:   Runny or stuffy (congested) nose.   Sneezing.   Cough.  Sore throat.   Headache.   Fatigue.   Fever.   Loss of appetite.   Pain in your forehead, behind your eyes, and over your cheekbones (sinus pain).  Muscle aches.  DIAGNOSIS  Your health care provider may diagnose a URI by:  Physical exam.  Tests to check that your symptoms are not due to another condition such as:  Strep throat.  Sinusitis.  Pneumonia.  Asthma. TREATMENT  A URI goes away on its own with time.  It cannot be cured with medicines, but medicines may be prescribed or recommended to relieve symptoms. Medicines may help:  Reduce your fever.  Reduce your cough.  Relieve nasal congestion. HOME CARE INSTRUCTIONS   Take medicines only as directed by your health care provider.   Gargle warm saltwater or take cough drops to comfort your throat as directed by your health care provider.  Use a warm mist humidifier or inhale steam from a shower to increase air moisture. This may make it easier to breathe.  Drink enough fluid to keep your urine clear or pale yellow.   Eat soups and other clear broths and maintain good nutrition.   Rest as needed.   Return to work when your temperature has returned to normal or as your health care provider advises. You may need to stay home longer to avoid infecting others. You can also use a face mask and careful hand washing to prevent spread of the virus.  Increase the usage of your inhaler if you have asthma.   Do not use any tobacco products, including cigarettes, chewing tobacco, or electronic cigarettes. If you need help quitting, ask your health care provider. PREVENTION  The best way to protect yourself from getting a cold is to practice good hygiene.   Avoid oral or hand contact with people with cold symptoms.   Wash your hands often if contact occurs.  There is no clear evidence that vitamin C, vitamin E, echinacea, or exercise reduces the chance of developing a cold. However, it is always recommended to get plenty of rest, exercise, and practice good nutrition.  SEEK MEDICAL CARE IF:   You are getting worse rather than better.   Your symptoms are not controlled by medicine.   You have chills.  You have worsening shortness of breath.  You have brown or red mucus.  You have yellow or brown nasal discharge.  You have pain in your face, especially when you bend forward.  You have a fever.  You have swollen neck  glands.  You have pain while swallowing.  You have white areas in the back of your throat. SEEK IMMEDIATE MEDICAL CARE IF:   You have severe or persistent:  Headache.  Ear pain.  Sinus pain.  Chest pain.  You have chronic lung disease and any of the following:  Wheezing.  Prolonged cough.  Coughing up blood.  A change in your usual mucus.  You have a stiff neck.  You have changes in your:  Vision.  Hearing.  Thinking.  Mood. MAKE SURE YOU:   Understand these instructions.  Will watch your condition.  Will get help right away if you are not doing well or get worse.   This information is not intended to replace advice given to you by your health care provider. Make sure you discuss any questions you have with your health care provider.   Document Released: 03/17/2001 Document Revised: 02/05/2015 Document Reviewed: 12/27/2013 Elsevier Interactive Patient Education [employer].

## 2015-12-16 ENCOUNTER — Ambulatory Visit: Payer: Self-pay

## 2015-12-16 ENCOUNTER — Ambulatory Visit: Payer: Medicaid Other | Attending: Internal Medicine

## 2015-12-16 DIAGNOSIS — E038 Other specified hypothyroidism: Secondary | ICD-10-CM | POA: Insufficient documentation

## 2015-12-16 DIAGNOSIS — E119 Type 2 diabetes mellitus without complications: Secondary | ICD-10-CM

## 2015-12-16 DIAGNOSIS — E034 Atrophy of thyroid (acquired): Secondary | ICD-10-CM | POA: Diagnosis present

## 2015-12-16 DIAGNOSIS — I10 Essential (primary) hypertension: Secondary | ICD-10-CM | POA: Insufficient documentation

## 2015-12-16 LAB — CBC WITH DIFFERENTIAL/PLATELET
BASOS ABS: 0.1 10*3/uL (ref 0.0–0.1)
Basophils Relative: 1 % (ref 0–1)
EOS ABS: 0.1 10*3/uL (ref 0.0–0.7)
Eosinophils Relative: 1 % (ref 0–5)
HEMATOCRIT: 37.8 % (ref 36.0–46.0)
HEMOGLOBIN: 12.2 g/dL (ref 12.0–15.0)
LYMPHS ABS: 2.5 10*3/uL (ref 0.7–4.0)
LYMPHS PCT: 42 % (ref 12–46)
MCH: 28.4 pg (ref 26.0–34.0)
MCHC: 32.3 g/dL (ref 30.0–36.0)
MCV: 88.1 fL (ref 78.0–100.0)
MONOS PCT: 11 % (ref 3–12)
MPV: 9.3 fL (ref 8.6–12.4)
Monocytes Absolute: 0.7 10*3/uL (ref 0.1–1.0)
NEUTROS ABS: 2.7 10*3/uL (ref 1.7–7.7)
NEUTROS PCT: 45 % (ref 43–77)
PLATELETS: 395 10*3/uL (ref 150–400)
RBC: 4.29 MIL/uL (ref 3.87–5.11)
RDW: 13.1 % (ref 11.5–15.5)
WBC: 6 10*3/uL (ref 4.0–10.5)

## 2015-12-16 LAB — TSH: TSH: 2.34 m[IU]/L

## 2015-12-16 LAB — LIPID PANEL
CHOL/HDL RATIO: 2.3 ratio (ref <=5.0)
Cholesterol: 191 mg/dL (ref 125–200)
HDL: 84 mg/dL (ref >=46)
LDL CALC: 94 mg/dL (ref <130)
Triglycerides: 65 mg/dL (ref <150)
VLDL: 13 mg/dL (ref <30)

## 2015-12-16 LAB — BASIC METABOLIC PANEL
BUN: 11 mg/dL (ref 7–25)
CHLORIDE: 102 mmol/L (ref 98–110)
CO2: 28 mmol/L (ref 20–31)
CREATININE: 0.74 mg/dL (ref 0.50–1.05)
Calcium: 9.5 mg/dL (ref 8.6–10.4)
Glucose, Bld: 97 mg/dL (ref 65–99)
POTASSIUM: 4.2 mmol/L (ref 3.5–5.3)
Sodium: 140 mmol/L (ref 135–146)

## 2015-12-16 MED FILL — WELCHOL 625 MG TABLET: 625 | 30 days supply | Qty: 180 | Fill #1

## 2015-12-20 ENCOUNTER — Ambulatory Visit: Payer: Medicaid Other | Attending: Internal Medicine

## 2015-12-20 ENCOUNTER — Ambulatory Visit (HOSPITAL_BASED_OUTPATIENT_CLINIC_OR_DEPARTMENT_OTHER): Payer: Medicaid Other | Admitting: Physician Assistant

## 2015-12-20 VITALS — BP 142/86 | HR 78 | Temp 98.1°F | Resp 16 | Ht 66.0 in | Wt 283.8 lb

## 2015-12-20 DIAGNOSIS — I10 Essential (primary) hypertension: Secondary | ICD-10-CM

## 2015-12-20 DIAGNOSIS — K449 Diaphragmatic hernia without obstruction or gangrene: Secondary | ICD-10-CM | POA: Diagnosis not present

## 2015-12-20 DIAGNOSIS — E785 Hyperlipidemia, unspecified: Secondary | ICD-10-CM | POA: Diagnosis not present

## 2015-12-20 DIAGNOSIS — R05 Cough: Secondary | ICD-10-CM

## 2015-12-20 DIAGNOSIS — Z7982 Long term (current) use of aspirin: Secondary | ICD-10-CM | POA: Insufficient documentation

## 2015-12-20 DIAGNOSIS — Z7984 Long term (current) use of oral hypoglycemic drugs: Secondary | ICD-10-CM | POA: Diagnosis not present

## 2015-12-20 DIAGNOSIS — G8929 Other chronic pain: Secondary | ICD-10-CM | POA: Insufficient documentation

## 2015-12-20 DIAGNOSIS — R0602 Shortness of breath: Secondary | ICD-10-CM | POA: Insufficient documentation

## 2015-12-20 DIAGNOSIS — F419 Anxiety disorder, unspecified: Secondary | ICD-10-CM | POA: Diagnosis not present

## 2015-12-20 DIAGNOSIS — G4730 Sleep apnea, unspecified: Secondary | ICD-10-CM | POA: Insufficient documentation

## 2015-12-20 DIAGNOSIS — R059 Cough, unspecified: Secondary | ICD-10-CM

## 2015-12-20 DIAGNOSIS — E119 Type 2 diabetes mellitus without complications: Secondary | ICD-10-CM

## 2015-12-20 DIAGNOSIS — K219 Gastro-esophageal reflux disease without esophagitis: Secondary | ICD-10-CM

## 2015-12-20 DIAGNOSIS — Z79899 Other long term (current) drug therapy: Secondary | ICD-10-CM | POA: Diagnosis not present

## 2015-12-20 DIAGNOSIS — E039 Hypothyroidism, unspecified: Secondary | ICD-10-CM | POA: Diagnosis not present

## 2015-12-20 DIAGNOSIS — J029 Acute pharyngitis, unspecified: Secondary | ICD-10-CM | POA: Diagnosis not present

## 2015-12-20 DIAGNOSIS — E034 Atrophy of thyroid (acquired): Secondary | ICD-10-CM | POA: Insufficient documentation

## 2015-12-20 DIAGNOSIS — M1990 Unspecified osteoarthritis, unspecified site: Secondary | ICD-10-CM | POA: Insufficient documentation

## 2015-12-20 DIAGNOSIS — E038 Other specified hypothyroidism: Secondary | ICD-10-CM

## 2015-12-20 MED ORDER — CELECOXIB 200 MG PO CAPS: 200.0000 mg | ORAL_CAPSULE | Freq: Two times a day (BID) | ORAL | Status: DC | PRN

## 2015-12-20 MED ORDER — COLESEVELAM HCL 625 MG PO TABS: 1875.0000 mg | ORAL_TABLET | Freq: Two times a day (BID) | ORAL | Status: DC

## 2015-12-20 MED ORDER — AZITHROMYCIN 250 MG PO TABS: ORAL_TABLET | ORAL | Status: DC

## 2015-12-20 MED ORDER — DEXLANSOPRAZOLE 60 MG PO CPDR: 60.0000 mg | DELAYED_RELEASE_CAPSULE | Freq: Every day | ORAL | Status: DC

## 2015-12-20 MED ORDER — BUPROPION HCL ER (XL) 300 MG PO TB24: 300.0000 mg | ORAL_TABLET | ORAL | Status: DC

## 2015-12-20 MED ORDER — LUBIPROSTONE 24 MCG PO CAPS: 24.0000 ug | ORAL_CAPSULE | Freq: Two times a day (BID) | ORAL | Status: DC

## 2015-12-20 MED ORDER — GABAPENTIN 300 MG PO CAPS: 600.0000 mg | ORAL_CAPSULE | Freq: Two times a day (BID) | ORAL | Status: DC

## 2015-12-20 MED ORDER — LEVOTHYROXINE SODIUM 88 MCG PO TABS: 88.0000 ug | ORAL_TABLET | Freq: Every day | ORAL | Status: DC

## 2015-12-20 MED ORDER — METFORMIN HCL 500 MG PO TABS: 500.0000 mg | ORAL_TABLET | Freq: Every day | ORAL | Status: DC

## 2015-12-20 MED ORDER — FERROUS SULFATE 325 (65 FE) MG PO TABS: 325.0000 mg | ORAL_TABLET | Freq: Two times a day (BID) | ORAL | Status: DC

## 2015-12-20 MED ORDER — LOSARTAN POTASSIUM-HCTZ 100-25 MG PO TABS: 1.0000 | ORAL_TABLET | Freq: Every day | ORAL | Status: DC

## 2015-12-20 MED FILL — ?AZITHROMYCIN 250 MG TABLET: 250 MG | 5 days supply | Qty: 6 | Fill #0

## 2015-12-20 MED FILL — GABAPENTIN 300 MG CAPSULE: 300 | 20 days supply | Qty: 60 | Fill #0

## 2015-12-20 MED FILL — LEVOTHYROXINE 88 MCG TABLET: 88 | 30 days supply | Qty: 30 | Fill #0

## 2015-12-20 MED FILL — FERROUS SULFATE 325 MG TAB: 325 (65 FE) | 30 days supply | Qty: 60 | Fill #0

## 2015-12-20 NOTE — Progress Notes (Signed)
Patient ID: Robin [name], female   DOB: 06-03-1956, 60 y.o.   MRN: [id number]   Robin [name], is a 60 y.o. female  [id number]  [id number]  DOB - 01/27/1956  Chief Complaint  Patient presents with  . Sore Throat  . Shortness of Breath        Subjective:   Robin [name] is a 60 y.o. female here today for a follow up visit after she was seen at Urgent Care and in the ER for SOB, chest pain, and cough with a positive D-dimer, negative cardiac enzymes,  and neg chest CT for PE. She continues to have some cough and wheezing. She is here today for f/up, med refills, and because she feels her reflux has worsened since being put on protonix and zantac.  She wants to go back on dexilant.  She has continued to have a cough that is worse at night since her ER visit.  Her cough has become more productive of yellow phlegm.  She has had some nasal irritation and a small amount of blood from her nose since starting the nasal spray.   She has signed up for a free medication program through the health department and says they will be able to help her with her dexilant, amitiza, celebrex, and welchol.  Her last A1C was less than 1 month ago and was 5.2.   Patient has No headache, No chest pain, No abdominal pain - No Nausea, No new weakness tingling or numbness  ALLERGIES: No Known Allergies  PAST MEDICAL HISTORY: Past Medical History  Diagnosis Date  . GERD (gastroesophageal reflux disease)   . Constipation   . Anxiety disorder   . Hyperlipidemia   . Hypertension   . Chronic neck pain   . Schatzki's ring     non critical  . Hiatal hernia     small  . Sleep apnea     CPAP, Sleep study at [facility]  . Arthritis   . Diabetes mellitus without complication ([place])    ROS otherwise negative MEDICATIONS AT HOME: Prior to Admission medications   Medication Sig Start Date End Date Taking? Authorizing Provider  Albuterol Sulfate (PROAIR RESPICLICK) [id number] (90 Base) MCG/ACT AEPB  Inhale 2 puffs into the lungs as needed. 12/09/15  Yes Maren Reamer, MD  aspirin 81 MG tablet Take 81 mg by mouth at bedtime. Reported on 12/09/2015   Yes Historical Provider, MD  budesonide (RHINOCORT AQUA) 32 MCG/ACT nasal spray Place 2 sprays into both nostrils daily. 12/09/15  Yes Maren Reamer, MD  buPROPion (WELLBUTRIN XL) 300 MG 24 hr tablet Take 1 tablet (300 mg total) by mouth every morning. Reported on 12/09/2015 12/20/15  Yes Argentina Donovan, PA-C  celecoxib (CELEBREX) 200 MG capsule Take 1 capsule (200 mg total) by mouth 2 (two) times daily as needed for mild pain. Reported on 12/09/2015 12/20/15  Yes Argentina Donovan, PA-C  colesevelam [facility]) 625 MG tablet Take 3 tablets (1,875 mg total) by mouth 2 (two) times daily with a meal. Reported on 12/09/2015 12/20/15  Yes Argentina Donovan, PA-C  ferrous sulfate 325 (65 FE) MG tablet Take 1 tablet (325 mg total) by mouth 2 (two) times daily with a meal. 12/20/15  Yes Dionne Bucy Greg Cratty, PA-C  fexofenadine (ALLEGRA) 180 MG tablet Take 180 mg by mouth daily. Reported on 12/09/2015   Yes Historical Provider, MD  fluticasone (FLONASE) 50 MCG/ACT nasal spray Place 2 sprays into both nostrils daily. 12/09/15  Yes Maren Reamer, MD  gabapentin (NEURONTIN) 300 MG capsule Take 2 capsules (600 mg total) by mouth 2 (two) times daily. Reported on 12/09/2015 12/20/15  Yes Argentina Donovan, PA-C  levothyroxine (SYNTHROID, LEVOTHROID) 88 MCG tablet Take 1 tablet (88 mcg total) by mouth daily. 12/20/15  Yes Dionne Bucy Deanthony Maull, PA-C  losartan-hydrochlorothiazide (HYZAAR) 100-25 MG tablet Take 1 tablet by mouth daily. Reported on 12/09/2015 12/20/15  Yes Argentina Donovan, PA-C  lubiprostone (AMITIZA) 24 MCG capsule Take 1 capsule (24 mcg total) by mouth 2 (two) times daily with a meal. 12/20/15  Yes Argentina Donovan, PA-C  metFORMIN (GLUCOPHAGE) 500 MG tablet Take 1 tablet (500 mg total) by mouth daily. 12/20/15  Yes Argentina Donovan, PA-C  Multiple Vitamin (MULTIVITAMIN) tablet Take  1 tablet by mouth daily. Reported on 12/09/2015 12/09/15  Yes Dawn Lazarus Gowda, MD  Omega-3 Fatty Acids (FISH OIL PO) Take 1 capsule by mouth at bedtime. Reported on 12/09/2015   Yes Historical Provider, MD  azithromycin (ZITHROMAX) 250 MG tablet 2 tabs today, then 1 tab days 2-5 12/20/15   Argentina Donovan, PA-C  dexlansoprazole (DEXILANT) 60 MG capsule Take 1 capsule (60 mg total) by mouth daily. 12/20/15   Argentina Donovan, PA-C  loratadine (CLARITIN) 10 MG tablet Take 1 tablet (10 mg total) by mouth daily. Patient not taking: Reported on 12/20/2015 12/09/15   Maren Reamer, MD     Objective:   Filed Vitals:   12/20/15 1425  BP: 142/86  Pulse: 78  Temp: 98.1 F (36.7 C)  TempSrc: Oral  Resp: 16  Height: 5\' 6"  (1.676 m)  Weight: 283 lb 12.8 oz (128.731 kg)  SpO2: 97%    Exam General appearance : Awake, alert, not in any distress. Speech Clear. Not toxic looking HEENT: Atraumatic and Normocephalic, pupils equally reactive to light and accomodation Neck: supple, no JVD. No cervical lymphadenopathy.  Chest:Good air entry bilaterally, no added sounds  CVS: S1 S2 regular, no murmurs.  Extremities: B/L Lower Ext shows no edema, both legs are warm to touch Neurology: Awake alert, and oriented X 3, CN II-XII intact, Non focal Skin:No Rash  Data Review Lab Results  Component Value Date   HGBA1C 5.2 12/09/2015   HGBA1C 5.6 03/04/2015     Assessment & Plan   1. Cough Advised Mucinex DM.  Due to length of time she has had a cough and worsening sputum production, I will cover her for atypicals and start her on a zpack.  This may resolve  2. HTN (hypertension), benign meds reordered  3. Gastroesophageal reflux disease without esophagitis Stop zantac and protonix and restart dexilant  4. Hypothyroidism due to acquired atrophy of thyroid Continue synthoid  5. Type 2 diabetes mellitus without complication, without long-term current use of insulin (HCC) Continue metformin.  A1C  normal  All meds refilled as requested.  The meds that health dept are helping with have been printed.   Patient have been counseled extensively about nutrition and exercise  Return in about 3 months (around 03/21/2016) for diabetes/htn/reflux 3 month check up.  The patient was given clear instructions to go to ER or return to medical center if symptoms don't improve, worsen or new problems develop. The patient verbalized understanding. The patient was told to call to get lab results if they haven't heard anything in the next week.   Freeman Caldron, PA-C [facility] and [place], [place]   12/20/2015, 5:02 PM

## 2015-12-20 NOTE — Patient Instructions (Signed)
Saline nasal spray-use 2-3 times daily Vaseline-apply inside the nose 1-2 times daily Mucinex DM tabs (generic is fine) for cough   Cough, Adult Coughing is a reflex that clears your throat and your airways. Coughing helps to heal and protect your lungs. It is normal to cough occasionally, but a cough that happens with other symptoms or lasts a long time may be a sign of a condition that needs treatment. A cough may last only 2-3 weeks (acute), or it may last longer than 8 weeks (chronic). CAUSES Coughing is commonly caused by:  Breathing in substances that irritate your lungs.  A viral or bacterial respiratory infection.  Allergies.  Asthma.  Postnasal drip.  Smoking.  Acid backing up from the stomach into the esophagus (gastroesophageal reflux).  Certain medicines.  Chronic lung problems, including COPD (or rarely, lung cancer).  Other medical conditions such as heart failure. HOME CARE INSTRUCTIONS  Pay attention to any changes in your symptoms. Take these actions to help with your discomfort:  Take medicines only as told by your health care provider.  If you were prescribed an antibiotic medicine, take it as told by your health care provider. Do not stop taking the antibiotic even if you start to feel better.  Talk with your health care provider before you take a cough suppressant medicine.  Drink enough fluid to keep your urine clear or pale yellow.  If the air is dry, use a cold steam vaporizer or humidifier in your bedroom or your home to help loosen secretions.  Avoid anything that causes you to cough at work or at home.  If your cough is worse at night, try sleeping in a semi-upright position.  Avoid cigarette smoke. If you smoke, quit smoking. If you need help quitting, ask your health care provider.  Avoid caffeine.  Avoid alcohol.  Rest as needed. SEEK MEDICAL CARE IF:   You have new symptoms.  You cough up pus.  Your cough does not get better  after 2-3 weeks, or your cough gets worse.  You cannot control your cough with suppressant medicines and you are losing sleep.  You develop pain that is getting worse or pain that is not controlled with pain medicines.  You have a fever.  You have unexplained weight loss.  You have night sweats. SEEK IMMEDIATE MEDICAL CARE IF:  You cough up blood.  You have difficulty breathing.  Your heartbeat is very fast.   This information is not intended to replace advice given to you by your health care provider. Make sure you discuss any questions you have with your health care provider.   Document Released: 03/20/2011 Document Revised: 06/12/2015 Document Reviewed: 11/28/2014 Elsevier Interactive Patient Education [employer].

## 2015-12-20 NOTE — Progress Notes (Signed)
Patient's here for sore throat and pain with breathing.   Patient was Dx with URI at the Urgent Care.  Patient states she still feels tired. She reports coughing up yellow-greenish mucus prior to URI Dx..   Patient states when blowing her nose, it started bleeding x1days ago.  Per Levada Dy, patient will not have a cbg this OV.   Patient requesting refill for levothyroxine, test strips, amitiza, celebrax, welchol.

## 2016-01-09 MED FILL — BUPROPION HCL XL 300 MG TAB: 300 | 30 days supply | Qty: 30 | Fill #1

## 2016-01-09 MED FILL — ?METFORMIN HCL 500MG TABLET: 500 | 30 days supply | Qty: 30 | Fill #1

## 2016-01-09 MED FILL — raNITIdine HCL 150 MG TABS: 150 | 30 days supply | Qty: 30 | Fill #1

## 2016-01-09 MED FILL — LOSARTAN-HCTZ 100-25 MG TAB: 100-25 | 30 days supply | Qty: 30 | Fill #1

## 2016-01-09 MED FILL — GABAPENTIN 300 MG CAPSULE: 300 | 20 days supply | Qty: 60 | Fill #1

## 2016-01-20 MED FILL — LEVOTHYROXINE 88 MCG TABLET: 88 | 30 days supply | Qty: 30 | Fill #1

## 2016-01-28 MED FILL — GABAPENTIN 300 MG CAPSULE: 300 | 20 days supply | Qty: 60 | Fill #2

## 2016-02-03 MED FILL — BUPROPION HCL XL 300 MG TAB: 300 | 30 days supply | Qty: 30 | Fill #2

## 2016-02-03 MED FILL — ?METFORMIN HCL 500MG TABLET: 500 | 30 days supply | Qty: 30 | Fill #2

## 2016-02-03 MED FILL — LOSARTAN-HCTZ 100-25 MG TAB: 100-25 | 30 days supply | Qty: 30 | Fill #2

## 2016-02-10 MED FILL — raNITIdine HCL 150 MG TABS: 150 | 30 days supply | Qty: 30 | Fill #2

## 2016-02-12 MED FILL — GABAPENTIN 300 MG CAPSULE: 300 | 15 days supply | Qty: 60 | Fill #1

## 2016-02-20 MED FILL — FERROUS SULFATE 325 MG TAB: 325 (65 FE) | 15 days supply | Qty: 30 | Fill #1

## 2016-02-20 MED FILL — LEVOTHYROXINE 88 MCG TABLET: 88 | 30 days supply | Qty: 30 | Fill #2

## 2016-03-03 ENCOUNTER — Other Ambulatory Visit: Payer: Self-pay | Admitting: Physician Assistant

## 2016-03-03 MED FILL — GABAPENTIN 300 MG CAPSULE: 300 | 30 days supply | Qty: 120 | Fill #0

## 2016-03-03 MED FILL — ?METFORMIN HCL 500MG TABLET: 500 | 30 days supply | Qty: 30 | Fill #3

## 2016-03-03 MED FILL — LOSARTAN-HCTZ 100-25 MG TAB: 100-25 | 30 days supply | Qty: 30 | Fill #3

## 2016-03-04 ENCOUNTER — Ambulatory Visit: Payer: Medicaid Other | Attending: Internal Medicine | Admitting: Internal Medicine

## 2016-03-04 ENCOUNTER — Encounter: Payer: Self-pay | Admitting: Internal Medicine

## 2016-03-04 VITALS — BP 142/84 | HR 71 | Temp 98.0°F | Wt 287.6 lb

## 2016-03-04 DIAGNOSIS — K219 Gastro-esophageal reflux disease without esophagitis: Secondary | ICD-10-CM | POA: Diagnosis not present

## 2016-03-04 DIAGNOSIS — E8881 Metabolic syndrome: Secondary | ICD-10-CM | POA: Insufficient documentation

## 2016-03-04 DIAGNOSIS — K449 Diaphragmatic hernia without obstruction or gangrene: Secondary | ICD-10-CM | POA: Diagnosis not present

## 2016-03-04 DIAGNOSIS — F419 Anxiety disorder, unspecified: Secondary | ICD-10-CM | POA: Diagnosis not present

## 2016-03-04 DIAGNOSIS — Z79899 Other long term (current) drug therapy: Secondary | ICD-10-CM | POA: Insufficient documentation

## 2016-03-04 DIAGNOSIS — R1030 Lower abdominal pain, unspecified: Secondary | ICD-10-CM | POA: Insufficient documentation

## 2016-03-04 DIAGNOSIS — M1990 Unspecified osteoarthritis, unspecified site: Secondary | ICD-10-CM | POA: Insufficient documentation

## 2016-03-04 DIAGNOSIS — R7303 Prediabetes: Secondary | ICD-10-CM

## 2016-03-04 DIAGNOSIS — I10 Essential (primary) hypertension: Secondary | ICD-10-CM | POA: Insufficient documentation

## 2016-03-04 DIAGNOSIS — M545 Low back pain, unspecified: Secondary | ICD-10-CM

## 2016-03-04 DIAGNOSIS — G4733 Obstructive sleep apnea (adult) (pediatric): Secondary | ICD-10-CM | POA: Diagnosis not present

## 2016-03-04 DIAGNOSIS — R319 Hematuria, unspecified: Secondary | ICD-10-CM

## 2016-03-04 DIAGNOSIS — F329 Major depressive disorder, single episode, unspecified: Secondary | ICD-10-CM | POA: Insufficient documentation

## 2016-03-04 DIAGNOSIS — Z7982 Long term (current) use of aspirin: Secondary | ICD-10-CM | POA: Insufficient documentation

## 2016-03-04 DIAGNOSIS — E039 Hypothyroidism, unspecified: Secondary | ICD-10-CM | POA: Insufficient documentation

## 2016-03-04 DIAGNOSIS — Z7984 Long term (current) use of oral hypoglycemic drugs: Secondary | ICD-10-CM | POA: Diagnosis not present

## 2016-03-04 LAB — CBC WITH DIFFERENTIAL/PLATELET
BASOS PCT: 1 %
Basophils Absolute: 66 cells/uL (ref 0–200)
Eosinophils Absolute: 66 cells/uL (ref 15–500)
Eosinophils Relative: 1 %
HEMATOCRIT: 39.3 % (ref 35.0–45.0)
Hemoglobin: 12.6 g/dL (ref 11.7–15.5)
LYMPHS PCT: 55 %
Lymphs Abs: 3630 cells/uL (ref 850–3900)
MCH: 27.8 pg (ref 27.0–33.0)
MCHC: 32.1 g/dL (ref 32.0–36.0)
MCV: 86.8 fL (ref 80.0–100.0)
MONO ABS: 660 {cells}/uL (ref 200–950)
MPV: 9.2 fL (ref 7.5–12.5)
Monocytes Relative: 10 %
Neutro Abs: 2178 cells/uL (ref 1500–7800)
Neutrophils Relative %: 33 %
PLATELETS: 389 10*3/uL (ref 140–400)
RBC: 4.53 MIL/uL (ref 3.80–5.10)
RDW: 13.5 % (ref 11.0–15.0)
WBC: 6.6 10*3/uL (ref 3.8–10.8)

## 2016-03-04 LAB — POCT URINALYSIS DIP (MANUAL ENTRY)
Bilirubin, UA: NEGATIVE
Blood, UA: NEGATIVE
Glucose, UA: NEGATIVE
Ketones, POC UA: NEGATIVE
LEUKOCYTES UA: NEGATIVE
NITRITE UA: NEGATIVE
PROTEIN UA: NEGATIVE
SPEC GRAV UA: 1.01
UROBILINOGEN UA: 0.2
pH, UA: 7

## 2016-03-04 LAB — BASIC METABOLIC PANEL WITH GFR
BUN: 9 mg/dL (ref 7–25)
CHLORIDE: 103 mmol/L (ref 98–110)
CO2: 26 mmol/L (ref 20–31)
CREATININE: 0.88 mg/dL (ref 0.50–1.05)
Calcium: 9.8 mg/dL (ref 8.6–10.4)
GFR, Est African American: 84 mL/min (ref >=60)
GFR, Est Non African American: 73 mL/min (ref >=60)
Glucose, Bld: 99 mg/dL (ref 65–99)
POTASSIUM: 4.2 mmol/L (ref 3.5–5.3)
SODIUM: 142 mmol/L (ref 135–146)

## 2016-03-04 LAB — TSH: TSH: 1.27 m[IU]/L

## 2016-03-04 LAB — T4, FREE: FREE T4: 1.3 ng/dL (ref 0.8–1.8)

## 2016-03-04 MED ORDER — AMLODIPINE BESYLATE 5 MG PO TABS: 5.0000 mg | ORAL_TABLET | Freq: Every day | ORAL | Status: DC

## 2016-03-04 MED ORDER — GABAPENTIN 300 MG PO CAPS: 600.0000 mg | ORAL_CAPSULE | Freq: Two times a day (BID) | ORAL | Status: DC

## 2016-03-04 MED ORDER — LUBIPROSTONE 24 MCG PO CAPS: 24.0000 ug | ORAL_CAPSULE | Freq: Two times a day (BID) | ORAL | Status: DC | PRN

## 2016-03-04 MED ORDER — CITALOPRAM HYDROBROMIDE 10 MG PO TABS: 10.0000 mg | ORAL_TABLET | Freq: Every day | ORAL | Status: DC

## 2016-03-04 MED FILL — ?AMLODIPINE BESYLATE 5 MG T: 5 | 30 days supply | Qty: 30 | Fill #0

## 2016-03-04 MED FILL — ?CITALOPRAM HBR 10 MG TABLE: 10 | 30 days supply | Qty: 30 | Fill #0

## 2016-03-04 NOTE — Patient Instructions (Signed)
High-Fiber Diet Fiber, also called dietary fiber, is a type of carbohydrate found in fruits, vegetables, whole grains, and beans. A high-fiber diet can have many health benefits. Your health care provider may recommend a high-fiber diet to help:  Prevent constipation. Fiber can make your bowel movements more regular.  Lower your cholesterol.  Relieve hemorrhoids, uncomplicated diverticulosis, or irritable bowel syndrome.  Prevent overeating as part of a weight-loss plan.  Prevent heart disease, type 2 diabetes, and certain cancers. WHAT IS MY PLAN? The recommended daily intake of fiber includes:  38 grams for men under age 27.  97 grams for men over age 77.  70 grams for women under age 31.  7 grams for women over age 62. You can get the recommended daily intake of dietary fiber by eating a variety of fruits, vegetables, grains, and beans. Your health care provider may also recommend a fiber supplement if it is not possible to get enough fiber through your diet. WHAT DO I NEED TO KNOW ABOUT A HIGH-FIBER DIET?  Fiber supplements have not been widely studied for their effectiveness, so it is better to get fiber through food sources.  Always check the fiber content on thenutrition facts label of any prepackaged food. Look for foods that contain at least 5 grams of fiber per serving.  Ask your dietitian if you have questions about specific foods that are related to your condition, especially if those foods are not listed in the following section.  Increase your daily fiber consumption gradually. Increasing your intake of dietary fiber too quickly may cause bloating, cramping, or gas.  Drink plenty of water. Water helps you to digest fiber. WHAT FOODS CAN I EAT? Grains Whole-grain breads. Multigrain cereal. Oats and oatmeal. Brown rice. Barley. Bulgur wheat. [place]. Bran muffins. Popcorn. Rye wafer crackers. Vegetables Sweet potatoes. Spinach. Kale. Artichokes. Cabbage. Broccoli.  Green peas. Carrots. Squash. Fruits Berries. Pears. Apples. Oranges. Avocados. Prunes and raisins. Dried figs. Meats and Other Protein Sources [place], kidney, pinto, and soy beans. Split peas. Lentils. Nuts and seeds. Dairy Fiber-fortified yogurt. Beverages Fiber-fortified soy milk. Fiber-fortified orange juice. Other Fiber bars. The items listed above may not be a complete list of recommended foods or beverages. Contact your dietitian for more options. WHAT FOODS ARE NOT RECOMMENDED? Grains White bread. Pasta made with refined flour. White rice. Vegetables Fried potatoes. Canned vegetables. Well-cooked vegetables.  Fruits Fruit juice. Cooked, strained fruit. Meats and Other Protein Sources Fatty cuts of meat. Fried Sales executive or fried fish. Dairy Milk. Yogurt. Cream cheese. Sour cream. Beverages Soft drinks. Other Cakes and pastries. Butter and oils. The items listed above may not be a complete list of foods and beverages to avoid. Contact your dietitian for more information. WHAT ARE SOME TIPS FOR INCLUDING HIGH-FIBER FOODS IN MY DIET?  Eat a wide variety of high-fiber foods.  Make sure that half of all grains consumed each day are whole grains.  Replace breads and cereals made from refined flour or white flour with whole-grain breads and cereals.  Replace white rice with brown rice, bulgur wheat, or millet.  Start the day with a breakfast that is high in fiber, such as a cereal that contains at least 5 grams of fiber per serving.  Use beans in place of meat in soups, salads, or pasta.  Eat high-fiber snacks, such as berries, raw vegetables, nuts, or popcorn.   This information is not intended to replace advice given to you by your health care provider. Make sure you discuss  any questions you have with your health care provider.   Document Released: 09/21/2005 Document Revised: 10/12/2014 Document Reviewed: 03/06/2014 Elsevier Interactive Patient Education 2016 [place] DASH stands for "Dietary Approaches to Stop Hypertension." The DASH eating plan is a healthy eating plan that has been shown to reduce high blood pressure (hypertension). Additional health benefits may include reducing the risk of type 2 diabetes mellitus, heart disease, and stroke. The DASH eating plan may also help with weight loss. WHAT DO I NEED TO KNOW ABOUT THE DASH EATING PLAN? For the DASH eating plan, you will follow these general guidelines:  Choose foods with a percent daily value for sodium of less than 5% (as listed on the food label).  Use salt-free seasonings or herbs instead of table salt or sea salt.  Check with your health care provider or pharmacist before using salt substitutes.  Eat lower-sodium products, often labeled as "lower sodium" or "no salt added."  Eat fresh foods.  Eat more vegetables, fruits, and low-fat dairy products.  Choose whole grains. Look for the word "whole" as the first word in the ingredient list.  Choose fish and skinless chicken or [place] more often than red meat. Limit fish, poultry, and meat to 6 oz (170 g) each day.  Limit sweets, desserts, sugars, and sugary drinks.  Choose heart-healthy fats.  Limit cheese to 1 oz (28 g) per day.  Eat more home-cooked food and less restaurant, buffet, and fast food.  Limit fried foods.  Cook foods using methods other than frying.  Limit canned vegetables. If you do use them, rinse them well to decrease the sodium.  When eating at a restaurant, ask that your food be prepared with less salt, or no salt if possible. WHAT FOODS CAN I EAT? Seek help from a dietitian for individual calorie needs. Grains Whole grain or whole wheat bread. Brown rice. Whole grain or whole wheat pasta. Quinoa, bulgur, and whole grain cereals. Low-sodium cereals. Corn or whole wheat flour tortillas. Whole grain cornbread. Whole grain crackers. Low-sodium crackers. Vegetables Fresh or frozen  vegetables (raw, steamed, roasted, or grilled). Low-sodium or reduced-sodium tomato and vegetable juices. Low-sodium or reduced-sodium tomato sauce and paste. Low-sodium or reduced-sodium canned vegetables.  Fruits All fresh, canned (in natural juice), or frozen fruits. Meat and Other Protein Products Ground beef (85% or leaner), grass-fed beef, or beef trimmed of fat. Skinless chicken or [place]. Ground chicken or [place]. Pork trimmed of fat. All fish and seafood. Eggs. Dried beans, peas, or lentils. Unsalted nuts and seeds. Unsalted canned beans. Dairy Low-fat dairy products, such as skim or 1% milk, 2% or reduced-fat cheeses, low-fat ricotta or cottage cheese, or plain low-fat yogurt. Low-sodium or reduced-sodium cheeses. Fats and Oils Tub margarines without trans fats. Light or reduced-fat mayonnaise and salad dressings (reduced sodium). Avocado. Safflower, olive, or canola oils. Natural peanut or almond butter. Other Unsalted popcorn and pretzels. The items listed above may not be a complete list of recommended foods or beverages. Contact your dietitian for more options. WHAT FOODS ARE NOT RECOMMENDED? Grains White bread. White pasta. White rice. Refined cornbread. Bagels and croissants. Crackers that contain trans fat. Vegetables Creamed or fried vegetables. Vegetables in a cheese sauce. Regular canned vegetables. Regular canned tomato sauce and paste. Regular tomato and vegetable juices. Fruits Dried fruits. Canned fruit in light or heavy syrup. Fruit juice. Meat and Other Protein Products Fatty cuts of meat. Ribs, chicken wings, bacon, sausage, bologna, salami, chitterlings, fatback,  hot dogs, bratwurst, and packaged luncheon meats. Salted nuts and seeds. Canned beans with salt. Dairy Whole or 2% milk, cream, half-and-half, and cream cheese. Whole-fat or sweetened yogurt. Full-fat cheeses or blue cheese. Nondairy creamers and whipped toppings. Processed cheese, cheese spreads, or cheese  curds. Condiments Onion and garlic salt, seasoned salt, table salt, and sea salt. Canned and packaged gravies. Worcestershire sauce. Tartar sauce. Barbecue sauce. Teriyaki sauce. Soy sauce, including reduced sodium. Steak sauce. Fish sauce. Oyster sauce. Cocktail sauce. Horseradish. Ketchup and mustard. Meat flavorings and tenderizers. Bouillon cubes. Hot sauce. Tabasco sauce. Marinades. Taco seasonings. Relishes. Fats and Oils Butter, stick margarine, lard, shortening, ghee, and bacon fat. Coconut, palm kernel, or palm oils. Regular salad dressings. Other Pickles and olives. Salted popcorn and pretzels. The items listed above may not be a complete list of foods and beverages to avoid. Contact your dietitian for more information. WHERE CAN I FIND MORE INFORMATION? National Heart, Lung, and Blood Institute: travelstabloid.com   This information is not intended to replace advice given to you by your health care provider. Make sure you discuss any questions you have with your health care provider.   Document Released: 09/10/2011 Document Revised: 10/12/2014 Document Reviewed: 07/26/2013 Elsevier Interactive Patient Education 2016 Elsevier Inc.  - Hypertension Hypertension is another name for high blood pressure. High blood pressure forces your heart to work harder to pump blood. A blood pressure reading has two numbers, which includes a higher number over a lower number (example: 110/72). HOME CARE   Have your blood pressure rechecked by your doctor.  Only take medicine as told by your doctor. Follow the directions carefully. The medicine does not work as well if you skip doses. Skipping doses also puts you at risk for problems.  Do not smoke.  Monitor your blood pressure at home as told by your doctor. GET HELP IF:  You think you are having a reaction to the medicine you are taking.  You have repeat headaches or feel dizzy.  You have puffiness  (swelling) in your ankles.  You have trouble with your vision. GET HELP RIGHT AWAY IF:   You get a very bad headache and are confused.  You feel weak, numb, or faint.  You get chest or belly (abdominal) pain.  You throw up (vomit).  You cannot breathe very well. MAKE SURE YOU:   Understand these instructions.  Will watch your condition.  Will get help right away if you are not doing well or get worse.   This information is not intended to replace advice given to you by your health care provider. Make sure you discuss any questions you have with your health care provider.   Document Released: 03/09/2008 Document Revised: 09/26/2013 Document Reviewed: 07/14/2013 Elsevier Interactive Patient Education [employer].

## 2016-03-04 NOTE — Progress Notes (Signed)
Robin [name], is a 60 y.o. female  [id number]  [id number]  DOB - 29-Jul-1956  Chief Complaint  Patient presents with  . Abdominal Pain    LBP;painful urination; hematuria x 2-3 weeks        Subjective:   Robin [name] is a 60 y.o. female here today for a follow up visit.  She c/o of low abd pain/bilateral lower back pain for several weeks now.  BMs have been normal, soft consistency though.  This past Thurs, after BM, she wiped some blood and felt it was coming from the vaginal area. Denies blood stools at time.  She is sp hysterectomy.  Bleeding subsequently stopped later that day.  She denies hematuria/hematochezia/brbpr/melena.  She has been taking the [place] again, which is helping w/ her bowels.   She is under a lot of stress recently, with her Husband's health problems. He has hx of kidney cancer, now with possible new lung cancer.  He is currently unemployed, so they have lost their health insurance.  She is worried about the bills, etc.    She gets emotional at times, but denies si/hi/avh.  Several weeks to months of fatigue.  Gets DOE w/ extreme exertion.  Has osa, has cpap at home, but machine is old and mask does not fit.  Patient has No headache, No chest pain,  No Nausea, No new weakness tingling or numbness, No Cough .  No problems updated.  ALLERGIES: No Known Allergies  PAST MEDICAL HISTORY: Past Medical History  Diagnosis Date  . GERD (gastroesophageal reflux disease)   . Constipation   . Anxiety disorder   . Hyperlipidemia   . Hypertension   . Chronic neck pain   . Schatzki's ring     non critical  . Hiatal hernia     small  . Sleep apnea     CPAP, Sleep study at [facility]  . Arthritis   . Diabetes mellitus without complication ([place])     MEDICATIONS AT HOME: Prior to Admission medications   Medication Sig Start Date End Date Taking? Authorizing Provider  Albuterol Sulfate (PROAIR RESPICLICK) [id number] (90 Base) MCG/ACT AEPB  Inhale 2 puffs into the lungs as needed. 12/09/15  Yes Maren Reamer, MD  aspirin 81 MG tablet Take 81 mg by mouth at bedtime. Reported on 12/09/2015   Yes Historical Provider, MD  budesonide (RHINOCORT AQUA) 32 MCG/ACT nasal spray Place 2 sprays into both nostrils daily. 12/09/15  Yes Maren Reamer, MD  buPROPion (WELLBUTRIN XL) 300 MG 24 hr tablet Take 1 tablet (300 mg total) by mouth every morning. Reported on 12/09/2015 12/20/15  Yes Argentina Donovan, PA-C  celecoxib (CELEBREX) 200 MG capsule Take 1 capsule (200 mg total) by mouth 2 (two) times daily as needed for mild pain. Reported on 12/09/2015 12/20/15  Yes Argentina Donovan, PA-C  colesevelam [facility]) 625 MG tablet Take 3 tablets (1,875 mg total) by mouth 2 (two) times daily with a meal. Reported on 12/09/2015 12/20/15  Yes Argentina Donovan, PA-C  dexlansoprazole (DEXILANT) 60 MG capsule Take 1 capsule (60 mg total) by mouth daily. 12/20/15  Yes Argentina Donovan, PA-C  ferrous sulfate 325 (65 FE) MG tablet Take 1 tablet (325 mg total) by mouth 2 (two) times daily with a meal. 12/20/15  Yes Dionne Bucy McClung, PA-C  fexofenadine (ALLEGRA) 180 MG tablet Take 180 mg by mouth daily. Reported on 12/09/2015   Yes Historical Provider, MD  fluticasone (FLONASE) 50  MCG/ACT nasal spray Place 2 sprays into both nostrils daily. 12/09/15  Yes Maren Reamer, MD  gabapentin (NEURONTIN) 300 MG capsule Take 2 capsules (600 mg total) by mouth 2 (two) times daily. Needs office visit for refills 03/04/16  Yes Maren Reamer, MD  levothyroxine (SYNTHROID, LEVOTHROID) 88 MCG tablet Take 1 tablet (88 mcg total) by mouth daily. 12/20/15  Yes Dionne Bucy McClung, PA-C  losartan-hydrochlorothiazide (HYZAAR) 100-25 MG tablet Take 1 tablet by mouth daily. Reported on 12/09/2015 12/20/15  Yes Dionne Bucy McClung, PA-C  lubiprostone (AMITIZA) 24 MCG capsule Take 1 capsule (24 mcg total) by mouth 2 (two) times daily as needed for constipation. 03/04/16  Yes Maren Reamer, MD  metFORMIN  (GLUCOPHAGE) 500 MG tablet Take 1 tablet (500 mg total) by mouth daily. 12/20/15  Yes Argentina Donovan, PA-C  Multiple Vitamin (MULTIVITAMIN) tablet Take 1 tablet by mouth daily. Reported on 12/09/2015 12/09/15  Yes Dawn Lazarus Gowda, MD  Omega-3 Fatty Acids (FISH OIL PO) Take 1 capsule by mouth at bedtime. Reported on 12/09/2015   Yes Historical Provider, MD  amLODipine (NORVASC) 5 MG tablet Take 1 tablet (5 mg total) by mouth daily. 03/04/16   Maren Reamer, MD  citalopram (CELEXA) 10 MG tablet Take 1 tablet (10 mg total) by mouth daily. 03/04/16   Maren Reamer, MD  loratadine (CLARITIN) 10 MG tablet Take 1 tablet (10 mg total) by mouth daily. Patient not taking: Reported on 12/20/2015 12/09/15   Maren Reamer, MD     Objective:   Filed Vitals:   03/04/16 1149  BP: 142/84  Pulse: 71  Temp: 98 F (36.7 C)  TempSrc: Oral  Weight: 287 lb 9.6 oz (130.455 kg)    Exam General appearance : Awake, alert, not in any distress. Speech Clear. Not toxic looking, morbid obese HEENT: Atraumatic and Normocephalic,  Neck: supple, no JVD. No cervical lymphadenopathy.  Chest:Good air entry bilaterally, no added sounds. CVS: S1 S2 regular, no murmurs/gallups or rubs. Abdomen: Bowel sounds active, obese. Non tender diffusely, NO distended with no gaurding, rigidity or rebound. Mild ttp to deep palpation of bladder  Ms: mild ttp lower back l4-5 region, spinal and paraspinal region, no cva tenderness bilat.  Extremities: B/L Lower Ext shows no edema, both legs are warm to touch, pulses 2+ bilat. Neurology: Awake alert, and oriented X 3, CN II-XII grossly intact, Non focal Skin:No Rash  Data Review Lab Results  Component Value Date   HGBA1C 5.2 12/09/2015   HGBA1C 5.6 03/04/2015    Depression screen [facility] 2/9 03/04/2016 12/20/2015 12/09/2015 02/20/2014  Decreased Interest 3 0 1 0  Down, Depressed, Hopeless 3 0 1 1  PHQ - 2 Score 6 0 2 1  Altered sleeping 1 - 1 -  Tired, decreased energy 3 - 2 -    Change in appetite 3 - 3 -  Feeling bad or failure about yourself  3 - 0 -  Trouble concentrating 1 - 1 -  Moving slowly or fidgety/restless 1 - 2 -  Suicidal thoughts 0 - 0 -  PHQ-9 Score 18 - 11 -  Difficult doing work/chores Very difficult - - -      Assessment & Plan   1. abd pain, ?hematuria,  - ua unremarkable, initially though cystitis but UA completely negative. - pt c/o of transient vagina bleeding last Thursday, none currently, s/p hysterectomy - will chk cbc. - asked her to decrease amitiza for now to see if abd pains/cramps  decrease, to prn status.   2. Bilateral low back pain without sciatica - perhaps cause of abd pain - POCT urinalysis dipstick  3. Essential hypertension - Controlled for most part., but w/ prediabetes/metabolic syndrome, should optimize - BASIC METABOLIC PANEL WITH GFR - continue hyzaar 100-25 wd - added norvasc 5mg  qday today, - low salt diet encouraged, may be able to get off norvasc if better controlled next time. - f/u next month  4. Hypothyroidism, unspecified hypothyroidism type On synthroid, will rechk, given c/o of fatigue - TSH - T4, Free  5. OSA (obstructive sleep apnea) - cpap machine old, mask doesn't fit. - may be causing fatigue - gave her rx to get new cpap mask fitting.   6. Morbid obesity, unspecified obesity type ([place]) /metabolic syndrome/prediabetes - on metformin qd - last aic 5.2 (3/17), 5.6 (5/16)   7. depression/anxiety w/ adjustment d/o due to financial situation and husband's health - she has been on welbutrin 300 for very long time - added low dose celexa 10mg , s/es discussed w/ pt.  Given Welbutrin may increase celexa levels in bloodstream, I chose lowest dose possible. - pt denies si/hi/avh  8. Financial services recd  9. Health maintenance - pt does not recall when last colonoscopy, applying for finacial assistance first - due for mm, but pt states last MM done outside facility 2016 was neg, hx of  breast cancer in Grandmother, neg hx in mom/siblings. - due for pap smear  - will do at next appt.  Patient have been counseled extensively about nutrition and exercise  Return in about 4 weeks (around 04/01/2016) for htn depression.  pap  The patient was given clear instructions to go to ER or return to medical center if symptoms don't improve, worsen or new problems develop. The patient verbalized understanding. The patient was told to call to get lab results if they haven't heard anything in the next week.    Maren Reamer, MD, [place] [facility] [place], [place]   03/04/2016, 1:42 PM

## 2016-03-05 ENCOUNTER — Other Ambulatory Visit: Payer: Self-pay | Admitting: Internal Medicine

## 2016-03-05 MED ORDER — POLYETHYLENE GLYCOL 3350 17 GM/SCOOP PO POWD: 17.0000 g | Freq: Two times a day (BID) | ORAL | Status: DC | PRN

## 2016-03-05 MED FILL — POLYETHYLENE GLYCOL 3350: 30 days supply | Qty: 510 | Fill #0

## 2016-03-09 ENCOUNTER — Other Ambulatory Visit: Payer: Self-pay | Admitting: Physician Assistant

## 2016-03-09 MED FILL — BUPROPION HCL XL 300 MG TAB: 300 | 30 days supply | Qty: 30 | Fill #3

## 2016-03-09 MED FILL — FERROUS SULFATE 325 MG TAB: 325 (65 FE) | 30 days supply | Qty: 60 | Fill #0

## 2016-03-11 MED FILL — raNITIdine HCL 150 MG TABS: 150 | 30 days supply | Qty: 30 | Fill #3

## 2016-03-23 MED FILL — LEVOTHYROXINE 88 MCG TABLET: 88 | 30 days supply | Qty: 30 | Fill #3

## 2016-04-01 ENCOUNTER — Encounter: Payer: Self-pay | Admitting: Internal Medicine

## 2016-04-01 ENCOUNTER — Ambulatory Visit: Payer: Medicaid Other | Attending: Internal Medicine | Admitting: Internal Medicine

## 2016-04-01 ENCOUNTER — Other Ambulatory Visit: Payer: Self-pay | Admitting: Internal Medicine

## 2016-04-01 ENCOUNTER — Telehealth: Payer: Self-pay | Admitting: Internal Medicine

## 2016-04-01 VITALS — BP 122/80 | HR 73 | Temp 98.2°F | Resp 16 | Ht 68.0 in | Wt 286.4 lb

## 2016-04-01 DIAGNOSIS — K219 Gastro-esophageal reflux disease without esophagitis: Secondary | ICD-10-CM | POA: Insufficient documentation

## 2016-04-01 DIAGNOSIS — K449 Diaphragmatic hernia without obstruction or gangrene: Secondary | ICD-10-CM | POA: Diagnosis not present

## 2016-04-01 DIAGNOSIS — Z7984 Long term (current) use of oral hypoglycemic drugs: Secondary | ICD-10-CM | POA: Diagnosis not present

## 2016-04-01 DIAGNOSIS — Z79899 Other long term (current) drug therapy: Secondary | ICD-10-CM | POA: Diagnosis not present

## 2016-04-01 DIAGNOSIS — F329 Major depressive disorder, single episode, unspecified: Secondary | ICD-10-CM | POA: Diagnosis not present

## 2016-04-01 DIAGNOSIS — Z1211 Encounter for screening for malignant neoplasm of colon: Secondary | ICD-10-CM

## 2016-04-01 DIAGNOSIS — Z6841 Body Mass Index (BMI) 40.0 and over, adult: Secondary | ICD-10-CM | POA: Insufficient documentation

## 2016-04-01 DIAGNOSIS — Z114 Encounter for screening for human immunodeficiency virus [HIV]: Secondary | ICD-10-CM

## 2016-04-01 DIAGNOSIS — Z7982 Long term (current) use of aspirin: Secondary | ICD-10-CM | POA: Diagnosis not present

## 2016-04-01 DIAGNOSIS — R5383 Other fatigue: Secondary | ICD-10-CM

## 2016-04-01 DIAGNOSIS — E119 Type 2 diabetes mellitus without complications: Secondary | ICD-10-CM | POA: Insufficient documentation

## 2016-04-01 DIAGNOSIS — G4733 Obstructive sleep apnea (adult) (pediatric): Secondary | ICD-10-CM | POA: Diagnosis not present

## 2016-04-01 DIAGNOSIS — F341 Dysthymic disorder: Secondary | ICD-10-CM | POA: Insufficient documentation

## 2016-04-01 DIAGNOSIS — Z1159 Encounter for screening for other viral diseases: Secondary | ICD-10-CM

## 2016-04-01 DIAGNOSIS — F32A Depression, unspecified: Secondary | ICD-10-CM | POA: Insufficient documentation

## 2016-04-01 MED ORDER — COLESEVELAM HCL 625 MG PO TABS: 1875.0000 mg | ORAL_TABLET | Freq: Two times a day (BID) | ORAL | Status: DC

## 2016-04-01 MED ORDER — CITALOPRAM HYDROBROMIDE 20 MG PO TABS: 20.0000 mg | ORAL_TABLET | Freq: Every day | ORAL | Status: DC

## 2016-04-01 MED FILL — ?AMLODIPINE BESYLATE 5 MG T: 5 | 30 days supply | Qty: 30 | Fill #1

## 2016-04-01 MED FILL — ?CITALOPRAM HBR 20 MG TABLE: 20 | 30 days supply | Qty: 30 | Fill #0

## 2016-04-01 NOTE — Patient Instructions (Signed)
Fatigue Fatigue is feeling tired all of the time, a lack of energy, or a lack of motivation. Occasional or mild fatigue is often a normal response to activity or life in general. However, long-lasting (chronic) or extreme fatigue may indicate an underlying medical condition. HOME CARE INSTRUCTIONS  Watch your fatigue for any changes. The following actions may help to lessen any discomfort you are feeling:  Talk to your health care provider about how much sleep you need each night. Try to get the required amount every night.  Take medicines only as directed by your health care provider.  Eat a healthy and nutritious diet. Ask your health care provider if you need help changing your diet.  Drink enough fluid to keep your urine clear or pale yellow.  Practice ways of relaxing, such as yoga, meditation, massage therapy, or acupuncture.  Exercise regularly.   Change situations that cause you stress. Try to keep your work and personal routine reasonable.  Do not abuse illegal drugs.  Limit alcohol intake to no more than 1 drink per day for nonpregnant women and 2 drinks per day for men. One drink equals 12 ounces of beer, 5 ounces of wine, or 1 ounces of hard liquor.  Take a multivitamin, if directed by your health care provider. SEEK MEDICAL CARE IF:   Your fatigue does not get better.  You have a fever.   You have unintentional weight loss or gain.  You have headaches.   You have difficulty:   Falling asleep.  Sleeping throughout the night.  You feel angry, guilty, anxious, or sad.   You are unable to have a bowel movement (constipation).   You skin is dry.   Your legs or another part of your body is swollen.  SEEK IMMEDIATE MEDICAL CARE IF:   You feel confused.   Your vision is blurry.  You feel faint or pass out.   You have a severe headache.   You have severe abdominal, pelvic, or back pain.   You have chest pain, shortness of breath, or an  irregular or fast heartbeat.   You are unable to urinate or you urinate less than normal.   You develop abnormal bleeding, such as bleeding from the rectum, vagina, nose, lungs, or nipples.  You vomit blood.   You have thoughts about harming yourself or committing suicide.   You are worried that you might harm someone else.    This information is not intended to replace advice given to you by your health care provider. Make sure you discuss any questions you have with your health care provider.   Document Released: 07/19/2007 Document Revised: 10/12/2014 Document Reviewed: 01/23/2014 Elsevier Interactive Patient Education 2016 [employer]    Major Depressive Disorder Major depressive disorder is a mental illness. It also may be called clinical depression or unipolar depression. Major depressive disorder usually causes feelings of sadness, hopelessness, or helplessness. Some people with this disorder do not feel particularly sad but lose interest in doing things they used to enjoy (anhedonia). Major depressive disorder also can cause physical symptoms. It can interfere with work, school, relationships, and other normal everyday activities. The disorder varies in severity but is longer lasting and more serious than the sadness we all feel from time to time in our lives. Major depressive disorder often is triggered by stressful life events or major life changes. Examples of these triggers include divorce, loss of your job or home, a move, and the death of a family member  or close friend. Sometimes this disorder occurs for no obvious reason at all. People who have family members with major depressive disorder or bipolar disorder are at higher risk for developing this disorder, with or without life stressors. Major depressive disorder can occur at any age. It may occur just once in your life (single episode major depressive disorder). It may occur multiple times (recurrent major depressive  disorder). SYMPTOMS People with major depressive disorder have either anhedonia or depressed mood on nearly a daily basis for at least 2 weeks or longer. Symptoms of depressed mood include:  Feelings of sadness (blue or down in the dumps) or emptiness.  Feelings of hopelessness or helplessness.  Tearfulness or episodes of crying (may be observed by others).  Irritability (children and adolescents). In addition to depressed mood or anhedonia or both, people with this disorder have at least four of the following symptoms:  Difficulty sleeping or sleeping too much.   Significant change (increase or decrease) in appetite or weight.   Lack of energy or motivation.  Feelings of guilt and worthlessness.   Difficulty concentrating, remembering, or making decisions.  Unusually slow movement (psychomotor retardation) or restlessness (as observed by others).   Recurrent wishes for death, recurrent thoughts of self-harm (suicide), or a suicide attempt. People with major depressive disorder commonly have persistent negative thoughts about themselves, other people, and the world. People with severe major depressive disorder may experiencedistorted beliefs or perceptions about the world (psychotic delusions). They also may see or hear things that are not real (psychotic hallucinations). DIAGNOSIS Major depressive disorder is diagnosed through an assessment by your health care provider. Your health care provider will ask aboutaspects of your daily life, such as mood,sleep, and appetite, to see if you have the diagnostic symptoms of major depressive disorder. Your health care provider may ask about your medical history and use of alcohol or drugs, including prescription medicines. Your health care provider also may do a physical exam and blood work. This is because certain medical conditions and the use of certain substances can cause major depressive disorder-like symptoms (secondary depression).  Your health care provider also may refer you to a mental health specialist for further evaluation and treatment. TREATMENT It is important to recognize the symptoms of major depressive disorder and seek treatment. The following treatments can be prescribed for this disorder:   Medicine. Antidepressant medicines usually are prescribed. Antidepressant medicines are thought to correct chemical imbalances in the brain that are commonly associated with major depressive disorder. Other types of medicine may be added if the symptoms do not respond to antidepressant medicines alone or if psychotic delusions or hallucinations occur.  Talk therapy. Talk therapy can be helpful in treating major depressive disorder by providing support, education, and guidance. Certain types of talk therapy also can help with negative thinking (cognitive behavioral therapy) and with relationship issues that trigger this disorder (interpersonal therapy). A mental health specialist can help determine which treatment is best for you. Most people with major depressive disorder do well with a combination of medicine and talk therapy. Treatments involving electrical stimulation of the brain can be used in situations with extremely severe symptoms or when medicine and talk therapy do not work over time. These treatments include electroconvulsive therapy, transcranial magnetic stimulation, and vagal nerve stimulation.   This information is not intended to replace advice given to you by your health care provider. Make sure you discuss any questions you have with your health care provider.   Document Released: 01/16/2013  Document Revised: 10/12/2014 Document Reviewed: 01/16/2013 Elsevier Interactive Patient Education 2016 [place] to [employer] Exercising can help you to lose weight. In order to lose weight through exercise, you need to do vigorous-intensity exercise. You can tell that you are exercising with  vigorous intensity if you are breathing very hard and fast and cannot hold a conversation while exercising. Moderate-intensity exercise helps to maintain your current weight. You can tell that you are exercising at a moderate level if you have a higher heart rate and faster breathing, but you are still able to hold a conversation. HOW OFTEN SHOULD I EXERCISE? Choose an activity that you enjoy and set realistic goals. Your health care provider can help you to make an activity plan that works for you. Exercise regularly as directed by your health care provider. This may include:  Doing resistance training twice each week, such as:  Push-ups.  Sit-ups.  Lifting weights.  Using resistance bands.  Doing a given intensity of exercise for a given amount of time. Choose from these options:  150 minutes of moderate-intensity exercise every week.  75 minutes of vigorous-intensity exercise every week.  A mix of moderate-intensity and vigorous-intensity exercise every week. Children, pregnant women, people who are out of shape, people who are overweight, and older adults may need to consult a health care provider for individual recommendations. If you have any sort of medical condition, be sure to consult your health care provider before starting a new exercise program. WHAT ARE SOME ACTIVITIES THAT CAN HELP ME TO LOSE WEIGHT?   Walking at a rate of at least 4.5 miles an hour.  Jogging or running at a rate of 5 miles per hour.  Biking at a rate of at least 10 miles per hour.  Lap swimming.  Roller-skating or in-line skating.  Cross-country skiing.  Vigorous competitive sports, such as football, basketball, and soccer.  Jumping rope.  Aerobic dancing. HOW CAN I BE MORE ACTIVE IN MY DAY-TO-DAY ACTIVITIES?  Use the stairs instead of the elevator.  Take a walk during your lunch break.  If you drive, park your car farther away from work or school.  If you take public transportation,  get off one stop early and walk the rest of the way.  Make all of your phone calls while standing up and walking around.  Get up, stretch, and walk around every 30 minutes throughout the day. WHAT GUIDELINES SHOULD I FOLLOW WHILE EXERCISING?  Do not exercise so much that you hurt yourself, feel dizzy, or get very short of breath.  Consult your health care provider prior to starting a new exercise program.  Wear comfortable clothes and shoes with good support.  Drink plenty of water while you exercise to prevent dehydration or heat stroke. Body water is lost during exercise and must be replaced.  Work out until you breathe faster and your heart beats faster.   This information is not intended to replace advice given to you by your health care provider. Make sure you discuss any questions you have with your health care provider.   Document Released: 10/24/2010 Document Revised: 10/12/2014 Document Reviewed: 02/22/2014 Elsevier Interactive Patient Education 2016 [place] for [employer] Loss Calories are energy you get from the things you eat and drink. Your body uses this energy to keep you going throughout the day. The number of calories you eat affects your weight. When you eat more calories than your body needs,  your body stores the extra calories as fat. When you eat fewer calories than your body needs, your body burns fat to get the energy it needs. Calorie counting means keeping track of how many calories you eat and drink each day. If you make sure to eat fewer calories than your body needs, you should lose weight. In order for calorie counting to work, you will need to eat the number of calories that are right for you in a day to lose a healthy amount of weight per week. A healthy amount of weight to lose per week is usually 1-2 lb (0.5-0.9 kg). A dietitian can determine how many calories you need in a day and give you suggestions on how to reach your calorie goal.   WHAT IS MY MY PLAN? My goal is to have __________ calories per day.  If I have this many calories per day, I should lose around __________ pounds per week. WHAT DO I NEED TO KNOW ABOUT CALORIE COUNTING? In order to meet your daily calorie goal, you will need to:  Find out how many calories are in each food you would like to eat. Try to do this before you eat.  Decide how much of the food you can eat.  Write down what you ate and how many calories it had. Doing this is called keeping a food log. WHERE DO I FIND CALORIE INFORMATION? The number of calories in a food can be found on a Nutrition Facts label. Note that all the information on a label is based on a specific serving of the food. If a food does not have a Nutrition Facts label, try to look up the calories online or ask your dietitian for help. HOW DO I DECIDE HOW MUCH TO EAT? To decide how much of the food you can eat, you will need to consider both the number of calories in one serving and the size of one serving. This information can be found on the Nutrition Facts label. If a food does not have a Nutrition Facts label, look up the information online or ask your dietitian for help. Remember that calories are listed per serving. If you choose to have more than one serving of a food, you will have to multiply the calories per serving by the amount of servings you plan to eat. For example, the label on a package of bread might say that a serving size is 1 slice and that there are 90 calories in a serving. If you eat 1 slice, you will have eaten 90 calories. If you eat 2 slices, you will have eaten 180 calories. HOW DO I KEEP A FOOD LOG? After each meal, record the following information in your food log:  What you ate.  How much of it you ate.  How many calories it had.  Then, add up your calories. Keep your food log near you, such as in a small notebook in your pocket. Another option is to use a mobile app or website. Some programs  will calculate calories for you and show you how many calories you have left each time you add an item to the log. WHAT ARE SOME CALORIE COUNTING TIPS?  Use your calories on foods and drinks that will fill you up and not leave you hungry. Some examples of this include foods like nuts and nut butters, vegetables, lean proteins, and high-fiber foods (more than 5 g fiber per serving).  Eat nutritious foods and avoid empty calories. Empty calories  are calories you get from foods or beverages that do not have many nutrients, such as candy and soda. It is better to have a nutritious high-calorie food (such as an avocado) than a food with few nutrients (such as a bag of chips).  Know how many calories are in the foods you eat most often. This way, you do not have to look up how many calories they have each time you eat them.  Look out for foods that may seem like low-calorie foods but are really high-calorie foods, such as baked goods, soda, and fat-free candy.  Pay attention to calories in drinks. Drinks such as sodas, specialty coffee drinks, alcohol, and juices have a lot of calories yet do not fill you up. Choose low-calorie drinks like water and diet drinks.  Focus your calorie counting efforts on higher calorie items. Logging the calories in a garden salad that contains only vegetables is less important than calculating the calories in a milk shake.  Find a way of tracking calories that works for you. Get creative. Most people who are successful find ways to keep track of how much they eat in a day, even if they do not count every calorie. WHAT ARE SOME PORTION CONTROL TIPS?  Know how many calories are in a serving. This will help you know how many servings of a certain food you can have.  Use a measuring cup to measure serving sizes. This is helpful when you start out. With time, you will be able to estimate serving sizes for some foods.  Take some time to put servings of different foods on  your favorite plates, bowls, and cups so you know what a serving looks like.  Try not to eat straight from a bag or box. Doing this can lead to overeating. Put the amount you would like to eat in a cup or on a plate to make sure you are eating the right portion.  Use smaller plates, glasses, and bowls to prevent overeating. This is a quick and easy way to practice portion control. If your plate is smaller, less food can fit on it.  Try not to multitask while eating, such as watching TV or using your computer. If it is time to eat, sit down at a table and enjoy your food. Doing this will help you to start recognizing when you are full. It will also make you more aware of what and how much you are eating. HOW CAN I CALORIE COUNT WHEN EATING OUT?  Ask for smaller portion sizes or child-sized portions.  Consider sharing an entree and sides instead of getting your own entree.  If you get your own entree, eat only half. Ask for a box at the beginning of your meal and put the rest of your entree in it so you are not tempted to eat it.  Look for the calories on the menu. If calories are listed, choose the lower calorie options.  Choose dishes that include vegetables, fruits, whole grains, low-fat dairy products, and lean protein. Focusing on smart food choices from each of the 5 food groups can help you stay on track at restaurants.  Choose items that are boiled, broiled, grilled, or steamed.  Choose water, milk, unsweetened iced tea, or other drinks without added sugars. If you want an alcoholic beverage, choose a lower calorie option. For example, a regular margarita can have up to 700 calories and a glass of wine has around 150.  Stay away from items that  are buttered, battered, fried, or served with cream sauce. Items labeled "crispy" are usually fried, unless stated otherwise.  Ask for dressings, sauces, and syrups on the side. These are usually very high in calories, so do not eat much of  them.  Watch out for salads. Many people think salads are a healthy option, but this is often not the case. Many salads come with bacon, fried chicken, lots of cheese, fried chips, and dressing. All of these items have a lot of calories. If you want a salad, choose a garden salad and ask for grilled meats or steak. Ask for the dressing on the side, or ask for olive oil and vinegar or lemon to use as dressing.  Estimate how many servings of a food you are given. For example, a serving of cooked rice is  cup or about the size of half a tennis ball or one cupcake wrapper. Knowing serving sizes will help you be aware of how much food you are eating at restaurants. The list below tells you how big or small some common portion sizes are based on everyday objects.  1 oz--4 stacked dice.  3 oz--1 deck of cards.  1 tsp--1 dice.  1 Tbsp-- a Ping-Pong ball.  2 Tbsp--1 Ping-Pong ball.   cup--1 tennis ball or 1 cupcake wrapper.  1 cup--1 baseball.   This information is not intended to replace advice given to you by your health care provider. Make sure you discuss any questions you have with your health care provider.   Document Released: 09/21/2005 Document Revised: 10/12/2014 Document Reviewed: 07/27/2013 Elsevier Interactive Patient Education [employer].

## 2016-04-01 NOTE — Telephone Encounter (Signed)
Dr. Hilarie Fredrickson  We received referral from patient's PCP for a colonoscopy.  Pt has GI hx with Dr. [name], records are in [facility]. However Dr. [name] does not except her insurance and requesting to switch to [facility]. Would you please review and will you accept?  And if so, is the patient do for a colonoscopy?  Thanks [employer]

## 2016-04-01 NOTE — Progress Notes (Signed)
Robin [name], is a 60 y.o. female  [id number]  [id number]  DOB - 31-May-1956  Chief Complaint  Patient presents with  . Follow-up    Depression        Subjective:   Robin [name] is a 60 y.o. female here today for a follow up visit for depression, w/ signif pmhx of morbid obesity, osa, depression, htn, chronic lower back pain. She seems to be doing better w/ celexa 10 qday added to her Welbutrin xl 300 qday. However, things have been stressful since her mom just passed away this last week, after long illness.  She denies si/hi/avh, but more fatigue than normal. She has CPAP machine, but still working on getting tubing for it, so currently not using it. Working on some financial paperwk for assistance.  Asked for disability parking placard.  Per pt, had MM last year 2016 at [facility] which was normal. Per pt, had tetanus show about 4 years ago she thinks.  Patient has No headache, No chest pain, No abdominal pain - No Nausea, No new weakness tingling or numbness, No Cough - SOB.  Problem  Depression (Emotion)  Osa (Obstructive Sleep Apnea)    ALLERGIES: No Known Allergies  PAST MEDICAL HISTORY: Past Medical History  Diagnosis Date  . GERD (gastroesophageal reflux disease)   . Constipation   . Anxiety disorder   . Hyperlipidemia   . Hypertension   . Chronic neck pain   . Schatzki's ring     non critical  . Hiatal hernia     small  . Sleep apnea     CPAP, Sleep study at [place]  . Arthritis   . Diabetes mellitus without complication ([place])     MEDICATIONS AT HOME: Prior to Admission medications   Medication Sig Start Date End Date Taking? Authorizing Provider  Albuterol Sulfate (PROAIR RESPICLICK) [id number] (90 Base) MCG/ACT AEPB Inhale 2 puffs into the lungs as needed. 12/09/15  Yes Maren Reamer, MD  amLODipine (NORVASC) 5 MG tablet Take 1 tablet (5 mg total) by mouth daily. 03/04/16  Yes Maren Reamer, MD  aspirin 81 MG tablet Take  81 mg by mouth at bedtime. Reported on 12/09/2015   Yes Historical Provider, MD  budesonide (RHINOCORT AQUA) 32 MCG/ACT nasal spray Place 2 sprays into both nostrils daily. 12/09/15  Yes Maren Reamer, MD  buPROPion (WELLBUTRIN XL) 300 MG 24 hr tablet Take 1 tablet (300 mg total) by mouth every morning. Reported on 12/09/2015 12/20/15  Yes Argentina Donovan, PA-C  celecoxib (CELEBREX) 200 MG capsule Take 1 capsule (200 mg total) by mouth 2 (two) times daily as needed for mild pain. Reported on 12/09/2015 12/20/15  Yes Dionne Bucy McClung, PA-C  citalopram (CELEXA) 20 MG tablet Take 1 tablet (20 mg total) by mouth daily. 04/01/16  Yes Maren Reamer, MD  colesevelam [facility]) 625 MG tablet Take 3 tablets (1,875 mg total) by mouth 2 (two) times daily with a meal. Reported on 12/09/2015 04/01/16  Yes Maren Reamer, MD  dexlansoprazole (DEXILANT) 60 MG capsule Take 1 capsule (60 mg total) by mouth daily. 12/20/15  Yes Argentina Donovan, PA-C  ferrous sulfate 325 (65 FE) MG tablet TAKE 1 TABLET BY MOUTH 2 TIMES DAILY WITH A MEAL. 03/09/16  Yes Maren Reamer, MD  fexofenadine (ALLEGRA) 180 MG tablet Take 180 mg by mouth daily. Reported on 12/09/2015   Yes Historical Provider, MD  fluticasone (FLONASE) 50 MCG/ACT nasal spray Place  2 sprays into both nostrils daily. 12/09/15  Yes Maren Reamer, MD  gabapentin (NEURONTIN) 300 MG capsule Take 2 capsules (600 mg total) by mouth 2 (two) times daily. Needs office visit for refills 03/04/16  Yes Maren Reamer, MD  levothyroxine (SYNTHROID, LEVOTHROID) 88 MCG tablet Take 1 tablet (88 mcg total) by mouth daily. 12/20/15  Yes Dionne Bucy McClung, PA-C  losartan-hydrochlorothiazide (HYZAAR) 100-25 MG tablet Take 1 tablet by mouth daily. Reported on 12/09/2015 12/20/15  Yes Dionne Bucy McClung, PA-C  lubiprostone (AMITIZA) 24 MCG capsule Take 1 capsule (24 mcg total) by mouth 2 (two) times daily as needed for constipation. 03/04/16  Yes Maren Reamer, MD  metFORMIN (GLUCOPHAGE) 500 MG  tablet Take 1 tablet (500 mg total) by mouth daily. 12/20/15  Yes Argentina Donovan, PA-C  Multiple Vitamin (MULTIVITAMIN) tablet Take 1 tablet by mouth daily. Reported on 12/09/2015 12/09/15  Yes Dawn Lazarus Gowda, MD  Omega-3 Fatty Acids (FISH OIL PO) Take 1 capsule by mouth at bedtime. Reported on 12/09/2015   Yes Historical Provider, MD  polyethylene glycol powder (GLYCOLAX/MIRALAX) powder Take 17 g by mouth 2 (two) times daily as needed. 03/05/16  Yes Maren Reamer, MD  loratadine (CLARITIN) 10 MG tablet Take 1 tablet (10 mg total) by mouth daily. Patient not taking: Reported on 12/20/2015 12/09/15   Maren Reamer, MD     Objective:   Filed Vitals:   04/01/16 1012  BP: 122/80  Pulse: 73  Temp: 98.2 F (36.8 C)  TempSrc: Oral  Resp: 16  Height: 5\' 8"  (1.727 m)  Weight: 286 lb 6.4 oz (129.91 kg)  SpO2: 97%   bmi 43.55  Exam General appearance : Awake, alert, not in any distress. Speech Clear. Not toxic looking, morbid obese, in good spirits.  Mobility limited due to pain, walking w/ cane. HEENT: Atraumatic and Normocephalic, pupils equally reactive to light. Neck: supple, no JVD.  Chest:Good air entry bilaterally, no added sounds. CVS: S1 S2 regular, no murmurs/gallups or rubs. Abdomen: Bowel sounds active, obese, Non tender and not distended with no gaurding, rigidity or rebound. Extremities: B/L Lower Ext shows no edema, both legs are warm to touch Neurology: Awake alert, and oriented X 3, CN II-XII grossly intact, Non focal Skin:No Rash  Data Review Lab Results  Component Value Date   HGBA1C 5.2 12/09/2015   HGBA1C 5.6 03/04/2015    Depression screen [facility] [place] 2/9 04/01/2016 03/04/2016 12/20/2015 12/09/2015 02/20/2014  Decreased Interest 1 3 0 1 0  Down, Depressed, Hopeless 3 3 0 1 1  PHQ - 2 Score 4 6 0 2 1  Altered sleeping 1 1 - 1 -  Tired, decreased energy 3 3 - 2 -  Change in appetite 3 3 - 3 -  Feeling bad or failure about yourself  0 3 - 0 -  Trouble concentrating 1 1 - 1  -  Moving slowly or fidgety/restless 3 1 - 2 -  Suicidal thoughts 0 0 - 0 -  PHQ-9 Score 15 18 - 11 -  Difficult doing work/chores - Very difficult - - -      Assessment & Plan   1. Depression (emotion) - No si/hi/avh, doing well w/ Welbutrin xl 300qd, celexa 10 may not be enough, will increase celexa to 20qd. D/w pt side effects to watch out for/gi issues, emotion instability, etc. Call me immediately if starts having problems with higher dose. - worse w/ recent death of mom last week.  2. Need  for hepatitis C screening test - Hepatitis C Ab Reflex HCV RNA, QUANT  3. Screening for HIV (human immunodeficiency virus) - HIV antibody (with reflex)  4. Morbid obesity, unspecified obesity type ([place]) bmi 43.55, recd low impact exercise such as swimming, tips provided for weight loss  5. Other fatigue - suspect multifactorial, from depression as well as untreated OSA - labs recently unremarkable, thyroid normal.  6. OSA (obstructive sleep apnea) - urged pt to check on ppwk for tubing of her CPAP machine, b/c this may be what is worsening her fatigue.   7. Health maintenance - per pt, had normal MM 2016 - per pt, had tdap about 4 years ago she thinks - ppwk pending for colonoscopy, pt to look into it., but will place referral GI as well.   Patient have been counseled extensively about nutrition and exercise  Return in about 3 months (around 07/02/2016), or if symptoms worsen or fail to improve.  The patient was given clear instructions to go to ER or return to medical center if symptoms don't improve, worsen or new problems develop. The patient verbalized understanding. The patient was told to call to get lab results if they haven't heard anything in the next week.   This note has been created with Surveyor, quantity. Any transcriptional errors are unintentional.   Maren Reamer, MD, [place] and [facility] [place], [place]   04/01/2016, 11:19 AM

## 2016-04-02 LAB — HIV ANTIBODY (ROUTINE TESTING W REFLEX): HIV 1&2 Ab, 4th Generation: NONREACTIVE

## 2016-04-02 LAB — HEPATITIS C ANTIBODY: HCV Ab: NEGATIVE

## 2016-04-02 NOTE — Telephone Encounter (Signed)
I have reviewed colon from 2012.  Descending polyp removed was NOT adenomatous. 2006 pathology reviewed and polyp removed was hyperplastic. No history of adenomatous polyp and per record no FH of colon cancer For these reasons she would be avg risk for CRC screening and repeat colonoscopy is recommended for Nov 2022.

## 2016-04-08 MED FILL — LOSARTAN-HCTZ 100-25 MG TAB: 100-25 | 30 days supply | Qty: 30 | Fill #4

## 2016-04-08 MED FILL — ?METFORMIN HCL 500MG TABLET: 500 | 30 days supply | Qty: 30 | Fill #0

## 2016-04-08 MED FILL — BUPROPION HCL XL 300 MG TAB: 300 | 30 days supply | Qty: 30 | Fill #4

## 2016-04-13 ENCOUNTER — Other Ambulatory Visit: Payer: Self-pay

## 2016-04-13 DIAGNOSIS — K760 Fatty (change of) liver, not elsewhere classified: Secondary | ICD-10-CM

## 2016-04-13 MED FILL — raNITIdine HCL 150 MG TABS: 150 | 30 days supply | Qty: 30 | Fill #4

## 2016-04-13 MED FILL — FERROUS SULFATE 325 MG TAB: 325 (65 FE) | 15 days supply | Qty: 30 | Fill #1

## 2016-04-20 ENCOUNTER — Telehealth: Payer: Self-pay | Admitting: Internal Medicine

## 2016-04-20 MED FILL — LEVOTHYROXINE 88 MCG TABLET: 88 | 30 days supply | Qty: 30 | Fill #4

## 2016-04-20 NOTE — Telephone Encounter (Signed)
Medication Refill: gabapentin (NEURONTIN) 300 MG capsule  Pt is aware that office visit is needed for medication refill  Pt came in to schedule appointment with PCP but provider schedule is full and pt is out of medication Pt was advised to call back next week when August schedule opens so we can schedule pt an appointment

## 2016-04-24 MED FILL — GABAPENTIN 300 MG CAPSULE: 300 | 15 days supply | Qty: 60 | Fill #2

## 2016-04-29 ENCOUNTER — Other Ambulatory Visit: Payer: Self-pay | Admitting: *Deleted

## 2016-04-29 MED ORDER — GABAPENTIN 300 MG PO CAPS: 600.0000 mg | ORAL_CAPSULE | Freq: Two times a day (BID) | ORAL | 2 refills | Status: DC

## 2016-04-29 NOTE — Telephone Encounter (Signed)
Patient was last seen on 04/01/16. Patients Gabapentin has been refilled.  Patient is aware of prescriptions being filled.

## 2016-05-05 ENCOUNTER — Other Ambulatory Visit: Payer: Self-pay | Admitting: Internal Medicine

## 2016-05-05 MED FILL — FERROUS SULFATE 325 MG TAB: 325 (65 FE) | 30 days supply | Qty: 60 | Fill #0

## 2016-05-05 MED FILL — LOSARTAN-HCTZ 100-25 MG TAB: 100-25 | 30 days supply | Qty: 30 | Fill #5

## 2016-05-05 MED FILL — ?METFORMIN HCL 500MG TABLET: 500 | 30 days supply | Qty: 30 | Fill #1

## 2016-05-05 MED FILL — ?AMLODIPINE BESYLATE 5 MG T: 5 | 30 days supply | Qty: 30 | Fill #2

## 2016-05-06 ENCOUNTER — Telehealth: Payer: Self-pay

## 2016-05-06 NOTE — Telephone Encounter (Signed)
-----   Message from Maren Reamer, MD sent at 04/08/2016 12:07 AM EDT ----- Please call, her hep c and hiv screening all negative. thanks

## 2016-05-06 NOTE — Telephone Encounter (Signed)
Cll pt  - advsd of lab results. Pt stated she understood and had no other questions.

## 2016-05-11 ENCOUNTER — Ambulatory Visit: Payer: No Typology Code available for payment source | Attending: Internal Medicine

## 2016-05-11 MED FILL — BUPROPION HCL XL 300 MG TAB: 300 | 30 days supply | Qty: 30 | Fill #5

## 2016-05-12 ENCOUNTER — Encounter: Payer: Self-pay | Admitting: Internal Medicine

## 2016-05-12 ENCOUNTER — Ambulatory Visit: Payer: Medicaid Other | Attending: Internal Medicine | Admitting: Internal Medicine

## 2016-05-12 VITALS — BP 123/81 | HR 66 | Temp 97.9°F | Resp 16 | Wt 285.6 lb

## 2016-05-12 DIAGNOSIS — M1990 Unspecified osteoarthritis, unspecified site: Secondary | ICD-10-CM | POA: Diagnosis not present

## 2016-05-12 DIAGNOSIS — Z7982 Long term (current) use of aspirin: Secondary | ICD-10-CM | POA: Insufficient documentation

## 2016-05-12 DIAGNOSIS — E119 Type 2 diabetes mellitus without complications: Secondary | ICD-10-CM | POA: Diagnosis not present

## 2016-05-12 DIAGNOSIS — M2550 Pain in unspecified joint: Secondary | ICD-10-CM

## 2016-05-12 DIAGNOSIS — G4730 Sleep apnea, unspecified: Secondary | ICD-10-CM | POA: Insufficient documentation

## 2016-05-12 DIAGNOSIS — Z79899 Other long term (current) drug therapy: Secondary | ICD-10-CM | POA: Diagnosis not present

## 2016-05-12 DIAGNOSIS — M25512 Pain in left shoulder: Secondary | ICD-10-CM | POA: Insufficient documentation

## 2016-05-12 DIAGNOSIS — M797 Fibromyalgia: Secondary | ICD-10-CM | POA: Diagnosis not present

## 2016-05-12 DIAGNOSIS — F329 Major depressive disorder, single episode, unspecified: Secondary | ICD-10-CM | POA: Insufficient documentation

## 2016-05-12 DIAGNOSIS — E785 Hyperlipidemia, unspecified: Secondary | ICD-10-CM | POA: Diagnosis not present

## 2016-05-12 DIAGNOSIS — F419 Anxiety disorder, unspecified: Secondary | ICD-10-CM | POA: Insufficient documentation

## 2016-05-12 DIAGNOSIS — K219 Gastro-esophageal reflux disease without esophagitis: Secondary | ICD-10-CM | POA: Diagnosis not present

## 2016-05-12 DIAGNOSIS — Z7984 Long term (current) use of oral hypoglycemic drugs: Secondary | ICD-10-CM | POA: Insufficient documentation

## 2016-05-12 DIAGNOSIS — I10 Essential (primary) hypertension: Secondary | ICD-10-CM | POA: Insufficient documentation

## 2016-05-12 DIAGNOSIS — K449 Diaphragmatic hernia without obstruction or gangrene: Secondary | ICD-10-CM | POA: Diagnosis not present

## 2016-05-12 MED ORDER — GABAPENTIN 300 MG PO CAPS: 900.0000 mg | ORAL_CAPSULE | Freq: Three times a day (TID) | ORAL | 2 refills | Status: DC

## 2016-05-12 MED ORDER — AMITRIPTYLINE HCL 50 MG PO TABS: 50.0000 mg | ORAL_TABLET | Freq: Every day | ORAL | 1 refills | Status: DC

## 2016-05-12 MED ORDER — TRAMADOL HCL 50 MG PO TABS: 50.0000 mg | ORAL_TABLET | Freq: Four times a day (QID) | ORAL | 0 refills | Status: DC | PRN

## 2016-05-12 MED ORDER — DICLOFENAC SODIUM 1 % TD GEL: 2.0000 g | Freq: Four times a day (QID) | TRANSDERMAL | 2 refills | Status: DC

## 2016-05-12 MED FILL — DICLOFENAC SODIUM 1% GEL: 1 | 30 days supply | Qty: 100 | Fill #0

## 2016-05-12 MED FILL — ?AMITRIPTYLINE HCL 50MG TAB: 50 | 30 days supply | Qty: 30 | Fill #0

## 2016-05-12 MED FILL — traMADol HCL 50 MG TABS: 50 | 15 days supply | Qty: 60 | Fill #0

## 2016-05-12 MED FILL — GABAPENTIN 300 MG CAPSULE: 300 | 20 days supply | Qty: 180 | Fill #0

## 2016-05-12 NOTE — Progress Notes (Signed)
Robin [name], is a 60 y.o. female  [id number]  [id number]  DOB - 10/18/1955  Chief Complaint  Patient presents with  . Chronic Care Management        Subjective:   Robin [name] is a 60 y.o. female here today for a follow up visit, With complaints of diffuse arthralgias and joint pains including her bilateral shoulders,  her right elbow, bilateral knees and lower back.  She has a history of fibromyalgia as well. Not currently taking anything. She also complains of increasing worsening neuropathy and tingling and burning pain in her bilateral legs   The only medication she is currently on right now is Neurontin 600 mg by mouth twice a day, Celebrex 200 mg when necessary twice a day.  She has been using a warm heat heating pad as well but is not helping much.  She remains on Celexa 20 mg daily as well as Wellbutrin XL 300 mg by mouth daily, which is helping her quite a bit for her depression and anxiety.  Family history of lupus on her uncle side   Patient has No headache, No chest pain, No abdominal pain - No Nausea, No Cough - SOB.  No problems updated.  ALLERGIES: No Known Allergies  PAST MEDICAL HISTORY: Past Medical History:  Diagnosis Date  . Anxiety disorder   . Arthritis   . Chronic neck pain   . Constipation   . Diabetes mellitus without complication ([place])   . GERD (gastroesophageal reflux disease)   . Hiatal hernia    small  . Hyperlipidemia   . Hypertension   . Schatzki's ring    non critical  . Sleep apnea    CPAP, Sleep study at [place]: Prior to Admission medications   Medication Sig Start Date End Date Taking? Authorizing Provider  Albuterol Sulfate (PROAIR RESPICLICK) [id number] (90 Base) MCG/ACT AEPB Inhale 2 puffs into the lungs as needed. 12/09/15  Yes Maren Reamer, MD  amLODipine (NORVASC) 5 MG tablet Take 1 tablet (5 mg total) by mouth daily. 03/04/16  Yes Maren Reamer, MD  aspirin 81 MG tablet  Take 81 mg by mouth at bedtime. Reported on 12/09/2015   Yes Historical Provider, MD  budesonide (RHINOCORT AQUA) 32 MCG/ACT nasal spray Place 2 sprays into both nostrils daily. 12/09/15  Yes Maren Reamer, MD  buPROPion (WELLBUTRIN XL) 300 MG 24 hr tablet Take 1 tablet (300 mg total) by mouth every morning. Reported on 12/09/2015 12/20/15  Yes Argentina Donovan, PA-C  celecoxib (CELEBREX) 200 MG capsule Take 1 capsule (200 mg total) by mouth 2 (two) times daily as needed for mild pain. Reported on 12/09/2015 12/20/15  Yes Dionne Bucy McClung, PA-C  citalopram (CELEXA) 20 MG tablet Take 1 tablet (20 mg total) by mouth daily. 04/01/16  Yes Maren Reamer, MD  colesevelam [facility]) 625 MG tablet Take 3 tablets (1,875 mg total) by mouth 2 (two) times daily with a meal. Reported on 12/09/2015 04/01/16  Yes Maren Reamer, MD  dexlansoprazole (DEXILANT) 60 MG capsule Take 1 capsule (60 mg total) by mouth daily. 12/20/15  Yes Argentina Donovan, PA-C  ferrous sulfate 325 (65 FE) MG tablet TAKE 1 TABLET BY MOUTH 2 TIMES DAILY WITH A MEAL. 05/05/16  Yes Maren Reamer, MD  fexofenadine (ALLEGRA) 180 MG tablet Take 180 mg by mouth daily. Reported on 12/09/2015   Yes Historical Provider, MD  fluticasone (FLONASE) 50 MCG/ACT  nasal spray Place 2 sprays into both nostrils daily. 12/09/15  Yes Maren Reamer, MD  gabapentin (NEURONTIN) 300 MG capsule Take 3 capsules (900 mg total) by mouth 3 (three) times daily. Needs office visit for refills 05/12/16  Yes Maren Reamer, MD  levothyroxine (SYNTHROID, LEVOTHROID) 88 MCG tablet Take 1 tablet (88 mcg total) by mouth daily. 12/20/15  Yes Dionne Bucy McClung, PA-C  loratadine (CLARITIN) 10 MG tablet Take 1 tablet (10 mg total) by mouth daily. 12/09/15  Yes Maren Reamer, MD  losartan-hydrochlorothiazide (HYZAAR) 100-25 MG tablet Take 1 tablet by mouth daily. Reported on 12/09/2015 12/20/15  Yes Dionne Bucy McClung, PA-C  lubiprostone (AMITIZA) 24 MCG capsule Take 1 capsule (24 mcg total) by  mouth 2 (two) times daily as needed for constipation. 03/04/16  Yes Maren Reamer, MD  metFORMIN (GLUCOPHAGE) 500 MG tablet Take 1 tablet (500 mg total) by mouth daily. 12/20/15  Yes Argentina Donovan, PA-C  Multiple Vitamin (MULTIVITAMIN) tablet Take 1 tablet by mouth daily. Reported on 12/09/2015 12/09/15  Yes Wayde Gopaul Lazarus Gowda, MD  Omega-3 Fatty Acids (FISH OIL PO) Take 1 capsule by mouth at bedtime. Reported on 12/09/2015   Yes Historical Provider, MD  polyethylene glycol powder (GLYCOLAX/MIRALAX) powder Take 17 g by mouth 2 (two) times daily as needed. 03/05/16  Yes Maren Reamer, MD  amitriptyline (ELAVIL) 50 MG tablet Take 1 tablet (50 mg total) by mouth at bedtime. 05/12/16   Maren Reamer, MD  diclofenac sodium (VOLTAREN) 1 % GEL Apply 2 g topically 4 (four) times daily. 05/12/16   Maren Reamer, MD  traMADol (ULTRAM) 50 MG tablet Take 1 tablet (50 mg total) by mouth every 6 (six) hours as needed. 05/12/16   Maren Reamer, MD     Objective:   Vitals:   05/12/16 1544  BP: 123/81  Pulse: 66  Resp: 16  Temp: 97.9 F (36.6 C)  TempSrc: Oral  SpO2: 97%  Weight: 285 lb 9.6 oz (129.5 kg)    Exam General appearance : Awake, alert, not in any distress. Speech Clear. Not toxic looking. Morbid obese, . She is walking today with a cane but conservatively. Her gait is decreased due to pain HEENT: Atraumatic and Normocephalic, pupils equally reactive to light. Neck: supple, no JVD. No cervical lymphadenopathy.  Chest:Good air entry bilaterally, no added sounds. CVS: S1 S2 regular, no murmurs/gallups or rubs. Abdomen: Bowel sounds active, obese,  Non tender  Extremities:  tenderness diffusely in bilateral shoulders, bilateral knees as well as her right elbow. No effusions noted on exam, but difficult exam secondary to body habitus as well B/L Lower Ext shows no edema, both legs are warm to touch Neurology: Awake alert, and oriented X 3, CN II-XII grossly intact, Non focal Skin:No  Rash  Data Review Lab Results  Component Value Date   HGBA1C 5.2 12/09/2015   HGBA1C 5.6 03/04/2015    Depression screen [facility] 2/9 04/01/2016 03/04/2016 12/20/2015 12/09/2015 02/20/2014  Decreased Interest 1 3 0 1 0  Down, Depressed, Hopeless 3 3 0 1 1  PHQ - 2 Score 4 6 0 2 1  Altered sleeping 1 1 - 1 -  Tired, decreased energy 3 3 - 2 -  Change in appetite 3 3 - 3 -  Feeling bad or failure about yourself  0 3 - 0 -  Trouble concentrating 1 1 - 1 -  Moving slowly or fidgety/restless 3 1 - 2 -  Suicidal thoughts 0  0 - 0 -  PHQ-9 Score 15 18 - 11 -  Difficult doing work/chores - Very difficult - - -      Assessment & Plan   1. Arthralgia, Polyarthralgia, certainly multifactorial, could be due to her fibromyalgia as well as morbid obesity and degenerative joint disease  - . Increase Neurontin to 900 mg by mouth three times a day  - Rheumatoid factor - Sedimentation rate - ANA - Continue Celebrex 200 mg when necessary twice a day -Voltaren gel added topically. Continues as much as 4 times daily - Recommend warm heat, muscle stretches to help ease muscle spasms and pain - Patient signed a pain contract today and we will start her on Ultram as well when necessary for severe pain - pending her paperwork in for financial assistance,  so that we can do further studies and imaging as needed  2. Fibromyalgia Trial amitriptyline 50 mg by mouth daily   3. Morbid obesity, unspecified obesity type ([place]) Makes things worse.     Patient have been counseled extensively about nutrition and exercise  Return in about 3 months (around 08/12/2016), or if symptoms worsen or fail to improve, for arthralgia/body aches.  The patient was given clear instructions to go to ER or return to medical center if symptoms don't improve, worsen or new problems develop. The patient verbalized understanding. The patient was told to call to get lab results if they haven't heard anything in the next week.   This  note has been created with Surveyor, quantity. Any transcriptional errors are unintentional.   Maren Reamer, MD, [place] and [facility] [place], [place]   05/12/2016, 4:53 PM

## 2016-05-12 NOTE — Patient Instructions (Signed)
Myofascial Pain Syndrome and Fibromyalgia Myofascial pain syndrome and fibromyalgia are both pain disorders. This pain may be felt mainly in your muscles.   Myofascial pain syndrome:  Always has trigger points or tender points in the muscle that will cause pain when pressed. The pain may come and go.  Usually affects your neck, upper back, and shoulder areas. The pain often radiates into your arms and hands.  Fibromyalgia:  Has muscle pains and tenderness that come and go.  Is often associated with fatigue and sleep disturbances.  Has trigger points.  Tends to be long-lasting (chronic), but is not life-threatening. Fibromyalgia and myofascial pain are not the same. However, they often occur together. If you have both conditions, each can make the other worse. Both are common and can cause enough pain and fatigue to make day-to-day activities difficult.  CAUSES  The exact causes of fibromyalgia and myofascial pain are not known. People with certain gene types may be more likely to develop fibromyalgia. Some factors can be triggers for both conditions, such as:   Spine disorders.  Arthritis.  Severe injury (trauma) and other physical stressors.  Being under a lot of stress.  A medical illness. SIGNS AND SYMPTOMS  Fibromyalgia The main symptom of fibromyalgia is widespread pain and tenderness in your muscles. This can vary over time. Pain is sometimes described as stabbing, shooting, or burning. You may have tingling or numbness, too. You may also have sleep problems and fatigue. You may wake up feeling tired and groggy (fibro fog). Other symptoms may include:   Bowel and bladder problems.  Headaches.  Visual problems.  Problems with odors and noises.  Depression or mood changes.  Painful menstrual periods (dysmenorrhea).  Dry skin or eyes. Myofascial pain syndrome Symptoms of myofascial pain syndrome include:   Tight, ropy bands of muscle.   Uncomfortable  sensations in muscular areas, such as:  Aching.  Cramping.  Burning.  Numbness.  Tingling.   Muscle weakness.  Trouble moving certain muscles freely (range of motion). DIAGNOSIS  There are no specific tests to diagnose fibromyalgia or myofascial pain syndrome. Both can be hard to diagnose because their symptoms are common in many other conditions. Your health care provider may suspect one or both of these conditions based on your symptoms and medical history. Your health care provider will also do a physical exam.  The key to diagnosing fibromyalgia is having pain, fatigue, and other symptoms for more than three months that cannot be explained by another condition.  The key to diagnosing myofascial pain syndrome is finding trigger points in muscles that are tender and cause pain elsewhere in your body (referred pain). TREATMENT  Treating fibromyalgia and myofascial pain often requires a team of health care providers. This usually starts with your primary provider and a physical therapist. You may also find it helpful to work with alternative health care providers, such as massage therapists or acupuncturists. Treatment for fibromyalgia may include medicines. This may include nonsteroidal anti-inflammatory drugs (NSAIDs), along with other medicines.  Treatment for myofascial pain may also include:  NSAIDs.  Cooling and stretching of muscles.  Trigger point injections.  Sound wave (ultrasound) treatments to stimulate muscles. HOME CARE INSTRUCTIONS   Take medicines only as directed by your health care provider.  Exercise as directed by your health care provider or physical therapist.  Try to avoid stressful situations.  Practice relaxation techniques to control your stress. You may want to try:  Biofeedback.  Visual imagery.  Hypnosis.  Muscle relaxation.  Yoga.  Meditation.  Talk to your health care provider about alternative treatments, such as acupuncture or  massage treatment.  Maintain a healthy lifestyle. This includes eating a healthy diet and getting enough sleep.  Consider joining a support group.  Do not do activities that stress or strain your muscles. That includes repetitive motions and heavy lifting. SEEK MEDICAL CARE IF:   You have new symptoms.  Your symptoms get worse.  You have side effects from your medicines.  You have trouble sleeping.  Your condition is causing depression or anxiety. FOR MORE INFORMATION   National Fibromyalgia Association: http://www.fmaware.orgwww.fmaware.[place]: http://www.arthritis.orgwww.arthritis.org  American Chronic Pain Association: StreetWrestling.at.https://stevens.biz/   This information is not intended to replace advice given to you by your health care provider. Make sure you discuss any questions you have with your health care provider.   Document Released: 09/21/2005 Document Revised: 10/12/2014 Document Reviewed: 06/27/2014 Elsevier Interactive Patient Education 2016 [place]. Back Exercises If you have pain in your back, do these exercises 2-3 times each day or as told by your doctor. When the pain goes away, do the exercises once each day, but repeat the steps more times for each exercise (do more repetitions). If you do not have pain in your back, do these exercises once each day or as told by your doctor. EXERCISES Single Knee to Chest Do these steps 3-5 times in a row for each leg: 1. Lie on your back on a firm bed or the floor with your legs stretched out. 2. Bring one knee to your chest. 3. Hold your knee to your chest by grabbing your knee or thigh. 4. Pull on your knee until you feel a gentle stretch in your lower back. 5. Keep doing the stretch for 10-30 seconds. 6. Slowly let go of your leg and straighten it. Pelvic Tilt Do these steps 5-10 times in a row: 1. Lie on your back on a firm bed or  the floor with your legs stretched out. 2. Bend your knees so they point up to the ceiling. Your feet should be flat on the floor. 3. Tighten your lower belly (abdomen) muscles to press your lower back against the floor. This will make your tailbone point up to the ceiling instead of pointing down to your feet or the floor. 4. Stay in this position for 5-10 seconds while you gently tighten your muscles and breathe evenly. Cat-Cow Do these steps until your lower back bends more easily: 1. Get on your hands and knees on a firm surface. Keep your hands under your shoulders, and keep your knees under your hips. You may put padding under your knees. 2. Let your head hang down, and make your tailbone point down to the floor so your lower back is round like the back of a cat. 3. Stay in this position for 5 seconds. 4. Slowly lift your head and make your tailbone point up to the ceiling so your back hangs low (sags) like the back of a cow. 5. Stay in this position for 5 seconds. Press-Ups Do these steps 5-10 times in a row: 1. Lie on your belly (face-down) on the floor. 2. Place your hands near your head, about shoulder-width apart. 3. While you keep your back relaxed and keep your hips on the floor, slowly straighten your arms to raise the top half of your body and lift your shoulders. Do not use your back muscles. To make yourself more comfortable, you may change  where you place your hands. 4. Stay in this position for 5 seconds. 5. Slowly return to lying flat on the floor. Bridges Do these steps 10 times in a row: 1. Lie on your back on a firm surface. 2. Bend your knees so they point up to the ceiling. Your feet should be flat on the floor. 3. Tighten your butt muscles and lift your butt off of the floor until your waist is almost as high as your knees. If you do not feel the muscles working in your butt and the back of your thighs, slide your feet 1-2 inches farther away from your butt. 4. Stay  in this position for 3-5 seconds. 5. Slowly lower your butt to the floor, and let your butt muscles relax. If this exercise is too easy, try doing it with your arms crossed over your chest. Belly Crunches Do these steps 5-10 times in a row: 1. Lie on your back on a firm bed or the floor with your legs stretched out. 2. Bend your knees so they point up to the ceiling. Your feet should be flat on the floor. 3. Cross your arms over your chest. 4. Tip your chin a little bit toward your chest but do not bend your neck. 5. Tighten your belly muscles and slowly raise your chest just enough to lift your shoulder blades a tiny bit off of the floor. 6. Slowly lower your chest and your head to the floor. Back Lifts Do these steps 5-10 times in a row: 1. Lie on your belly (face-down) with your arms at your sides, and rest your forehead on the floor. 2. Tighten the muscles in your legs and your butt. 3. Slowly lift your chest off of the floor while you keep your hips on the floor. Keep the back of your head in line with the curve in your back. Look at the floor while you do this. 4. Stay in this position for 3-5 seconds. 5. Slowly lower your chest and your face to the floor. GET HELP IF:  Your back pain gets a lot worse when you do an exercise.  Your back pain does not lessen 2 hours after you exercise. If you have any of these problems, stop doing the exercises. Do not do them again unless your doctor says it is okay. GET HELP RIGHT AWAY IF:  You have sudden, very bad back pain. If this happens, stop doing the exercises. Do not do them again unless your doctor says it is okay.   This information is not intended to replace advice given to you by your health care provider. Make sure you discuss any questions you have with your health care provider.   Document Released: 10/24/2010 Document Revised: 06/12/2015 Document Reviewed: 11/15/2014 Elsevier Interactive Patient Education [employer].

## 2016-05-13 ENCOUNTER — Telehealth: Payer: Self-pay

## 2016-05-13 LAB — ANA: Anti Nuclear Antibody(ANA): NEGATIVE

## 2016-05-13 LAB — RHEUMATOID FACTOR: Rhuematoid fact SerPl-aCnc: <10 IU/mL (ref <=14)

## 2016-05-13 LAB — SEDIMENTATION RATE: Sed Rate: 10 mm/hr (ref 0–30)

## 2016-05-13 NOTE — Telephone Encounter (Signed)
Contacted pt to go over lab work. Pt is aware of results and doesn't have any questions or concerns

## 2016-05-15 NOTE — Telephone Encounter (Signed)
Review of patient chart indicates prescription written by Dr. Janne Napoleon 05/12/16 for gabapentin.  No further action needed at this time. Priscille Heidelberg, RN, BSN

## 2016-05-19 MED FILL — ?LEVOTHYROXINE 88 MCG TAB: 88 | 30 days supply | Qty: 30 | Fill #5

## 2016-05-22 MED FILL — raNITIdine HCL 150 MG TABS: 150 | 30 days supply | Qty: 30 | Fill #5

## 2016-06-04 MED FILL — ?AMLODIPINE BESYLATE 5 MG T: 5 | 30 days supply | Qty: 30 | Fill #3

## 2016-06-04 MED FILL — ?LOSARTAN-HCTZ 100-25 MG TA: 100MG-25MG | 30 days supply | Qty: 30 | Fill #6

## 2016-06-04 MED FILL — ?METFORMIN HCL 500MG TABLET: 500 | 30 days supply | Qty: 30 | Fill #2

## 2016-06-08 ENCOUNTER — Encounter (HOSPITAL_COMMUNITY): Payer: Self-pay | Admitting: Student-PharmD

## 2016-06-09 MED FILL — ?AMITRIPTYLINE HCL 50MG TAB: 50 | 30 days supply | Qty: 30 | Fill #1

## 2016-06-09 MED FILL — ?BUPROPION HCL XL 300 MG TA: 300 | 30 days supply | Qty: 30 | Fill #0

## 2016-06-11 ENCOUNTER — Other Ambulatory Visit: Payer: Self-pay | Admitting: Pharmacist

## 2016-06-11 MED ORDER — COLESEVELAM HCL 625 MG PO TABS: 1875.0000 mg | ORAL_TABLET | Freq: Two times a day (BID) | ORAL | 3 refills | Status: DC

## 2016-06-17 ENCOUNTER — Other Ambulatory Visit: Payer: Self-pay | Admitting: Internal Medicine

## 2016-06-17 MED FILL — ?LEVOTHYROXINE 88 MCG TAB: 88 | 30 days supply | Qty: 30 | Fill #6

## 2016-06-23 ENCOUNTER — Other Ambulatory Visit: Payer: Self-pay | Admitting: Internal Medicine

## 2016-07-03 ENCOUNTER — Other Ambulatory Visit: Payer: Self-pay | Admitting: Internal Medicine

## 2016-07-03 MED FILL — LOSARTAN-HCTZ 100-25 MG TAB: 100-25 | 30 days supply | Qty: 30 | Fill #7

## 2016-07-03 MED FILL — ?METFORMIN HCL 500MG TABLET: 500 | 30 days supply | Qty: 30 | Fill #3

## 2016-07-06 MED FILL — FERROUS SULFATE 325 MG TAB: 325 (65 FE) | 30 days supply | Qty: 60 | Fill #0

## 2016-07-07 MED FILL — raNITIdine HCL 150 MG TABS: 150 | 30 days supply | Qty: 30 | Fill #0

## 2016-07-07 MED FILL — AMLODIPINE BESYLATE 5 MG TA: 5 | 30 days supply | Qty: 30 | Fill #4

## 2016-07-07 MED FILL — ?AMITRIPTYLINE HCL 50MG TAB: 50 | 30 days supply | Qty: 30 | Fill #2

## 2016-07-15 MED FILL — GABAPENTIN 300 MG CAPSULE: 300 | 20 days supply | Qty: 180 | Fill #1

## 2016-07-15 MED FILL — ?BUPROPION HCL XL 300 MG TA: 300 | 30 days supply | Qty: 30 | Fill #1

## 2016-07-24 MED FILL — ?LEVOTHYROXINE 88 MCG TAB: 88 | 30 days supply | Qty: 30 | Fill #7

## 2016-08-03 MED FILL — LOSARTAN-HCTZ 100-25 MG TAB: 100-25 | 30 days supply | Qty: 30 | Fill #8

## 2016-08-03 MED FILL — raNITIdine HCL 150 MG TABS: 150 | 30 days supply | Qty: 30 | Fill #1

## 2016-08-03 MED FILL — ?METFORMIN HCL 500MG TABLET: 500 | 30 days supply | Qty: 30 | Fill #4

## 2016-08-05 ENCOUNTER — Encounter: Payer: Self-pay | Admitting: Internal Medicine

## 2016-08-05 ENCOUNTER — Ambulatory Visit: Payer: Medicaid Other | Attending: Internal Medicine | Admitting: Internal Medicine

## 2016-08-05 VITALS — BP 139/71 | HR 72 | Temp 98.2°F | Resp 16 | Wt 295.6 lb

## 2016-08-05 DIAGNOSIS — E039 Hypothyroidism, unspecified: Secondary | ICD-10-CM | POA: Diagnosis not present

## 2016-08-05 DIAGNOSIS — Z1239 Encounter for other screening for malignant neoplasm of breast: Secondary | ICD-10-CM

## 2016-08-05 DIAGNOSIS — Z1211 Encounter for screening for malignant neoplasm of colon: Secondary | ICD-10-CM

## 2016-08-05 DIAGNOSIS — G8929 Other chronic pain: Secondary | ICD-10-CM | POA: Insufficient documentation

## 2016-08-05 DIAGNOSIS — Z23 Encounter for immunization: Secondary | ICD-10-CM | POA: Diagnosis not present

## 2016-08-05 DIAGNOSIS — K760 Fatty (change of) liver, not elsewhere classified: Secondary | ICD-10-CM | POA: Insufficient documentation

## 2016-08-05 DIAGNOSIS — Z7982 Long term (current) use of aspirin: Secondary | ICD-10-CM | POA: Diagnosis not present

## 2016-08-05 DIAGNOSIS — K219 Gastro-esophageal reflux disease without esophagitis: Secondary | ICD-10-CM | POA: Diagnosis not present

## 2016-08-05 DIAGNOSIS — E119 Type 2 diabetes mellitus without complications: Secondary | ICD-10-CM | POA: Diagnosis not present

## 2016-08-05 DIAGNOSIS — E785 Hyperlipidemia, unspecified: Secondary | ICD-10-CM | POA: Diagnosis not present

## 2016-08-05 DIAGNOSIS — F419 Anxiety disorder, unspecified: Secondary | ICD-10-CM | POA: Diagnosis not present

## 2016-08-05 DIAGNOSIS — M4712 Other spondylosis with myelopathy, cervical region: Secondary | ICD-10-CM | POA: Diagnosis not present

## 2016-08-05 DIAGNOSIS — I10 Essential (primary) hypertension: Secondary | ICD-10-CM

## 2016-08-05 DIAGNOSIS — R52 Pain, unspecified: Secondary | ICD-10-CM | POA: Diagnosis not present

## 2016-08-05 DIAGNOSIS — Z1231 Encounter for screening mammogram for malignant neoplasm of breast: Secondary | ICD-10-CM

## 2016-08-05 DIAGNOSIS — M1990 Unspecified osteoarthritis, unspecified site: Secondary | ICD-10-CM | POA: Diagnosis not present

## 2016-08-05 DIAGNOSIS — G4733 Obstructive sleep apnea (adult) (pediatric): Secondary | ICD-10-CM | POA: Diagnosis not present

## 2016-08-05 DIAGNOSIS — Z7984 Long term (current) use of oral hypoglycemic drugs: Secondary | ICD-10-CM | POA: Diagnosis not present

## 2016-08-05 DIAGNOSIS — M797 Fibromyalgia: Secondary | ICD-10-CM

## 2016-08-05 DIAGNOSIS — F329 Major depressive disorder, single episode, unspecified: Secondary | ICD-10-CM

## 2016-08-05 DIAGNOSIS — R7303 Prediabetes: Secondary | ICD-10-CM

## 2016-08-05 DIAGNOSIS — F32A Depression, unspecified: Secondary | ICD-10-CM

## 2016-08-05 DIAGNOSIS — K222 Esophageal obstruction: Secondary | ICD-10-CM | POA: Diagnosis not present

## 2016-08-05 DIAGNOSIS — M542 Cervicalgia: Secondary | ICD-10-CM | POA: Diagnosis not present

## 2016-08-05 LAB — TSH: TSH: 1.89 m[IU]/L

## 2016-08-05 LAB — T4, FREE: Free T4: 1.2 ng/dL (ref 0.8–1.8)

## 2016-08-05 LAB — GLUCOSE, POCT (MANUAL RESULT ENTRY): POC GLUCOSE: 103 mg/dL — AB (ref 70–99)

## 2016-08-05 LAB — POCT GLYCOSYLATED HEMOGLOBIN (HGB A1C): HEMOGLOBIN A1C: 5.4

## 2016-08-05 MED ORDER — METFORMIN HCL 500 MG PO TABS: 250.0000 mg | ORAL_TABLET | Freq: Every day | ORAL | 3 refills | Status: DC

## 2016-08-05 MED ORDER — FEXOFENADINE HCL 180 MG PO TABS: 180.0000 mg | ORAL_TABLET | Freq: Every day | ORAL | 3 refills | Status: DC

## 2016-08-05 MED ORDER — FLUTICASONE PROPIONATE 50 MCG/ACT NA SUSP: 2.0000 | Freq: Every day | NASAL | 6 refills | Status: DC

## 2016-08-05 MED ORDER — BUPROPION HCL ER (XL) 300 MG PO TB24: 300.0000 mg | ORAL_TABLET | ORAL | 1 refills | Status: DC

## 2016-08-05 MED ORDER — AMLODIPINE BESYLATE 5 MG PO TABS: 5.0000 mg | ORAL_TABLET | Freq: Every day | ORAL | 3 refills | Status: DC

## 2016-08-05 MED ORDER — CITALOPRAM HYDROBROMIDE 20 MG PO TABS: 20.0000 mg | ORAL_TABLET | Freq: Every day | ORAL | 2 refills | Status: DC

## 2016-08-05 MED ORDER — ASPIRIN 81 MG PO TABS: 81.0000 mg | ORAL_TABLET | Freq: Every day | ORAL | 3 refills | Status: DC

## 2016-08-05 MED ORDER — AMITRIPTYLINE HCL 50 MG PO TABS: 50.0000 mg | ORAL_TABLET | Freq: Every day | ORAL | 1 refills | Status: DC

## 2016-08-05 MED ORDER — ALBUTEROL SULFATE 108 (90 BASE) MCG/ACT IN AEPB: 2.0000 | INHALATION_SPRAY | RESPIRATORY_TRACT | 1 refills | Status: DC | PRN

## 2016-08-05 MED ORDER — LOSARTAN POTASSIUM-HCTZ 100-25 MG PO TABS: 1.0000 | ORAL_TABLET | Freq: Every day | ORAL | 2 refills | Status: DC

## 2016-08-05 MED FILL — ?AMLODIPINE BESYLATE 5 MG T: 5 | 30 days supply | Qty: 30 | Fill #0

## 2016-08-05 MED FILL — FLUTICASONE PROP 50 MCG SPR: 50 | 30 days supply | Qty: 16 | Fill #0

## 2016-08-05 MED FILL — ?AMITRIPTYLINE HCL 50MG TAB: 50 | 30 days supply | Qty: 30 | Fill #0

## 2016-08-05 MED FILL — ?BUPROPION HCL XL 300 MG TA: 300 | 30 days supply | Qty: 30 | Fill #0

## 2016-08-05 MED FILL — ?CITALOPRAM HBR 20 MG TABLE: 20 | 30 days supply | Qty: 30 | Fill #0

## 2016-08-05 MED FILL — PROAIR RESPICLICK INHAL PWD: 108 (90 BAS | 25 days supply | Qty: 1 | Fill #0

## 2016-08-05 NOTE — Progress Notes (Signed)
Pt is in the office today for a follow up on body aches Pt states her pain level today in the office is a 9 Pt states her arthitis took over

## 2016-08-05 NOTE — Progress Notes (Signed)
Robin [name], is a 60 y.o. female  [id number]  [id number]  DOB - 1955-11-29  Chief Complaint  Patient presents with  . Generalized Body Aches        Subjective:   Robin [name] is a 60 y.o. female here today for a follow up visit. Last seen in clinic 05/12/2016, significant history arthralgias polyarthralgias, multifactorial including morbid obesity, as well as fibromyalgia. And deg joint disease.  I just mentioned last time, but she states symptoms are still there has not improved greatly.  She does think amitriptyline has helped quite a bit, but not taking Celebrex since it did not do much for her.  Complains of diffuse hand pains and numbness at times, both upper extremities and is not getting better, but appears slightly worse.  Requested a shower chair as well  Patient has No headache, No chest pain, No abdominal pain - No Nausea, No new weakness tingling or numbness, No Cough - SOB.  No problems updated.  ALLERGIES: No Known Allergies  PAST MEDICAL HISTORY: Past Medical History:  Diagnosis Date  . Anxiety disorder   . Arthritis   . Chronic neck pain   . Constipation   . Diabetes mellitus without complication ([place])   . GERD (gastroesophageal reflux disease)   . Hiatal hernia    small  . Hyperlipidemia   . Hypertension   . Schatzki's ring    non critical  . Sleep apnea    CPAP, Sleep study at [facility]: Prior to Admission medications   Medication Sig Start Date End Date Taking? Authorizing Provider  Albuterol Sulfate (PROAIR RESPICLICK) [id number] (90 Base) MCG/ACT AEPB Inhale 2 puffs into the lungs as needed. 08/05/16  Yes Maren Reamer, MD  amitriptyline (ELAVIL) 50 MG tablet Take 1 tablet (50 mg total) by mouth at bedtime. 08/05/16  Yes Maren Reamer, MD  amLODipine (NORVASC) 5 MG tablet Take 1 tablet (5 mg total) by mouth daily. 08/05/16  Yes Maren Reamer, MD  aspirin 81 MG tablet Take 1 tablet (81 mg total) by  mouth at bedtime. Reported on 12/09/2015 08/05/16  Yes Dawn T Janne Napoleon, MD  budesonide (RHINOCORT AQUA) 32 MCG/ACT nasal spray Place 2 sprays into both nostrils daily. 12/09/15  Yes Maren Reamer, MD  buPROPion (WELLBUTRIN XL) 300 MG 24 hr tablet Take 1 tablet (300 mg total) by mouth every morning. Reported on 12/09/2015 08/05/16  Yes Dawn Lazarus Gowda, MD  citalopram (CELEXA) 20 MG tablet Take 1 tablet (20 mg total) by mouth daily. 08/05/16  Yes Maren Reamer, MD  dexlansoprazole (DEXILANT) 60 MG capsule Take 1 capsule (60 mg total) by mouth daily. 12/20/15  Yes Argentina Donovan, PA-C  diclofenac sodium (VOLTAREN) 1 % GEL Apply 2 g topically 4 (four) times daily. 05/12/16  Yes Maren Reamer, MD  ferrous sulfate 325 (65 FE) MG tablet TAKE 1 TABLET BY MOUTH 2 TIMES DAILY WITH A MEAL. 07/06/16  Yes Maren Reamer, MD  fexofenadine (ALLEGRA) 180 MG tablet Take 1 tablet (180 mg total) by mouth daily. Reported on 12/09/2015 08/05/16  Yes Dawn T Janne Napoleon, MD  fluticasone (FLONASE) 50 MCG/ACT nasal spray Place 2 sprays into both nostrils daily. 08/05/16  Yes Maren Reamer, MD  gabapentin (NEURONTIN) 300 MG capsule Take 3 capsules (900 mg total) by mouth 3 (three) times daily. Needs office visit for refills 05/12/16  Yes Maren Reamer, MD  levothyroxine (SYNTHROID, [place]) 65  MCG tablet Take 1 tablet (88 mcg total) by mouth daily. 12/20/15  Yes Dionne Bucy McClung, PA-C  loratadine (CLARITIN) 10 MG tablet Take 1 tablet (10 mg total) by mouth daily. 12/09/15  Yes Maren Reamer, MD  losartan-hydrochlorothiazide (HYZAAR) 100-25 MG tablet Take 1 tablet by mouth daily. Reported on 12/09/2015 08/05/16  Yes Dawn Lazarus Gowda, MD  lubiprostone (AMITIZA) 24 MCG capsule Take 1 capsule (24 mcg total) by mouth 2 (two) times daily as needed for constipation. 03/04/16  Yes Maren Reamer, MD  metFORMIN (GLUCOPHAGE) 500 MG tablet Take 0.5 tablets (250 mg total) by mouth daily with breakfast. 08/05/16  Yes Maren Reamer, MD    Multiple Vitamin (MULTIVITAMIN) tablet Take 1 tablet by mouth daily. Reported on 12/09/2015 12/09/15  Yes Dawn Lazarus Gowda, MD  Omega-3 Fatty Acids (FISH OIL PO) Take 1 capsule by mouth at bedtime. Reported on 12/09/2015   Yes Historical Provider, MD  polyethylene glycol powder (GLYCOLAX/MIRALAX) powder Take 17 g by mouth 2 (two) times daily as needed. 03/05/16  Yes Maren Reamer, MD  traMADol (ULTRAM) 50 MG tablet Take 1 tablet (50 mg total) by mouth every 6 (six) hours as needed. 05/12/16  Yes Maren Reamer, MD     Objective:   Vitals:   08/05/16 1011  BP: 139/71  Pulse: 72  Resp: 16  Temp: 98.2 F (36.8 C)  TempSrc: Oral  SpO2: 97%  Weight: 295 lb 9.6 oz (134.1 kg)    Exam General appearance : Awake, alert, not in any distress. Speech Clear. Not toxic looking, morbid obese, pleasant. Walking w/ can, slow gait. HEENT: Atraumatic and Normocephalic, pupils equally reactive to light.  bilat tms clear. Neck: supple, no JVD.   Chest:Good air entry bilaterally, no added sounds. CVS: S1 S2 regular, no murmurs/gallups or rubs. Abdomen: Bowel sounds active, Non tender, obese, and not distended with no gaurding, rigidity or rebound. Extremities: B/L Lower Ext shows no edema, both legs are warm to touch Neurology: Awake alert, and oriented X 3, CN II-XII grossly intact, Non focal Skin:No Rash  Data Review Lab Results  Component Value Date   HGBA1C 5.4 08/05/2016   HGBA1C 5.2 12/09/2015   HGBA1C 5.6 03/04/2015    Depression screen PHQ 2/9 04/01/2016 03/04/2016 12/20/2015 12/09/2015 02/20/2014  Decreased Interest 1 3 0 1 0  Down, Depressed, Hopeless 3 3 0 1 1  PHQ - 2 Score 4 6 0 2 1  Altered sleeping 1 1 - 1 -  Tired, decreased energy 3 3 - 2 -  Change in appetite 3 3 - 3 -  Feeling bad or failure about yourself  0 3 - 0 -  Trouble concentrating 1 1 - 1 -  Moving slowly or fidgety/restless 3 1 - 2 -  Suicidal thoughts 0 0 - 0 -  PHQ-9 Score 15 18 - 11 -  Difficult doing work/chores  - Very difficult - - -    MRI 08/14/2015 Multilevel cervical spondylosis which appears worse from a comparison in 2012 3 at C4-C5 where there is moderate central canal stenosis.  Assessment & Plan   1. Spondylosis, cervical, with myelopathy - may be causing worsening hand pains/ numbness, - Continue same meds for now when necessary,  hopefully evaluation by neurosurgery or pain management will be able to help - Ambulatory referral to Neurosurgery - Ambulatory referral to Pain Clinic  2. Fatty liver - History taking WelChol, but her lipids reviewed 3/17 were actually quite normal, we'll stop  the WelChol,  to see if it helps with her myalgia   3. OSA (obstructive sleep apnea) Using her CPAP without difficulty  4. HTN (hypertension), benign Controlled Continue Norvasc 5 mg daily  5. Prediabetes Better controlled. Recommend decreasing her metformin to half a tablet daily for now - Glucose (CBG) - POCT glycosylated hemoglobin (Hb A1C) 5.4  6. Colon cancer screening - Ambulatory referral to Gastroenterology  7. Breast cancer screening - MM Digital Screening; Future  8. Hypothyroidism, unspecified type We'll check levels prior to renewing Synthroid - TSH - T4, Free  9. Encounter for immunization -pnemococcal 23 v today. - Flu Vaccine QUAD 36+ mos IM - tdap today  10. Morbid obesity -weight loss would help her considerably. I recommend local low-fat, high fatdiet with intermittent fasting,  information provided. If she can restrict her carbohydrates, she will have considerable weight loss.     Patient have been counseled extensively about nutrition and exercise  Return in about 3 months (around 11/05/2016).  The patient was given clear instructions to go to ER or return to medical center if symptoms don't improve, worsen or new problems develop. The patient verbalized understanding. The patient was told to call to get lab results if they haven't heard anything in the next  week.   This note has been created with Surveyor, quantity. Any transcriptional errors are unintentional.   Maren Reamer, MD, [place] [facility], [place]   08/05/2016, 1:28 PM

## 2016-08-05 NOTE — Patient Instructions (Addendum)
QUICK START PATIENT GUIDE TO LCHF/IF LOW CARB HIGH FAT / INTERMITTENT FASTING  Recommend: <50 gram carbohydrate a day for weightloss.  What is this diet and how does it work? o Insulin is a hormone made by your body that allows you to use sugar (glucose) from carbohydrates in the food you eat for energy or to store glucose (as fat) for future use  o Insulin levels need to be lowered in order to utilize our stored energy (fat) o Many struggling with obesity are insulin resistant and have high levels of insulin o This diet works to lower your insulin in two ways o Fasting - allows your insulin levels to naturally decrease  o Avoiding carbohydrates - carbs trigger increase in insulin Low Carb Healthy Fat (LCHF) o Get a free app for your phone, such as MyFitnessPal, to help you track your macronutrients (carbs/protein/fats) and to track your weight and body measurements to see your progress o Set your goal for around 10% carbs/20% protein/70% fat o A good starting goal for amount of net carbs per day is 50 grams (some will aim for 20 grams) o "Net carbs" refers to total grams of carbs minus grams of fiber (as fiber is not typically absorbed). For example, if a food has 5g total carb and 3g fiber, that would be 2g net carbs o Increase healthy fats - eg. olive oil, eggs, nuts, avocado, cheese, butter, coconut, meats, fish o Avoid high carb foods - eg. bread, pasta, potatoes, rice, cookies, soda, juice, anything sugary o Buy full-fat ingredients (avoid low-fat versions, which often have more sugar) o No need to count calories, but pay close attention to grams of carbs on labels Intermittent Fasting (IF) o "Fasting" is going a period of time without eating - it helps to stay busy and well-hydrated o Purpose of fasting is to allow insulin levels to drop as low as possible, allowing your body to switch into fat-burning mode o With this diet there are many approaches to fasting, but 16:8 and 24hr fasts  are commonly used o 16:8 fast, usually 5-7 days a week - Fasting for 16 hours of the day, then eating all meals for the day over course of 8 hours. o 24 hour fast, usually 1-3 days a week - Typically eating one meal a day, then fasting until the next day. Plenty of fluids (and some salt to help you hold onto fluids) are recommended during longer fasts.  o During fasts certain beverages are still acceptable - water, sparkling water, bone broth, black tea or coffee, or tea/coffee with small amount of heavy whipping cream Special note for those on diabetic medications o Discuss your medications with your physician. You may need to hold your medication or adjust to only taking when eating. Diabetics should keep close track of their blood sugars when making any changes to diet/meds, to ensure they are staying within normal limits For more info about LCHF/IF o Watch "Therapeutic Fasting - Solving the Two-Compartment Problem" video by Dr. Jason Fung on YouTube (https://www.youtube.com/watch?v=tIuj-oMN-Fk) for a great intro to these concepts o Read "The Obesity Code" and/or "The Complete Guide to Fasting" by Dr. Jason Fung o Go to www.dietdoctor.com for explanations, recipes, and infographics about foods to eat/avoid o Get a Free smartphone app that helps count carbohydrates  - ie MyKeto EXAMPLES TO GET STARTED Fasting Beverages -water (can add  tsp Pink Himalayan salt once or twice a day to help stay hydrated for longer fasts) -Sparkling water (such as   [employer] or similar; avoid any with artificial sweeteners)  -Bone broth (multiple recipes available online or can buy pre-made) -Tea or Coffee (Adding heavy whipping cream or coconut oil to your tea or coffee can be helpful if you find yourself getting too hungry during the fasts. Can also add cinnamon for flavor. Or "bulletproof coffee.") Low Carb Healthy Fat Breakfast (if not fasting) -eggs in butter or olive oil with avocado -omelet with veggies and  cheese  Lunch -hamburger with cheese and avocado wrapped in lettuce (no bun, no ketchup) -meat and cheese wrapped in lettuce (can dip in mustard or olive oil/vinegar/mayo) -salad with meat/cheese/nuts and higher fat dressing (vinaigrette or Ranch, etc) -tuna salad lettuce wrap -taco meat with cheese, sour cream, guacamole, cheese over lettuce  Dinner -steak with herb butter or Barnaise sauce -"Fathead" pizza (uses cheese and almond flour for the dough - several recipes available online) -roasted or grilled chicken with skin on, with low carb sauce (buffalo, garlic butter, alfredo, pesto, etc) -baked salmon with lemon butter -chicken alfredo with zucchini noodles -[place] butter chicken with low carb garlic naan -egg roll in a bowl  Side Dishes -mashed cauliflower (homemade or available in freezer section) -roast vegetables (green veggies that grow above ground rather than root veggies) with butter or cheese -Caprese salad (fresh mozzarella, tomato and basil with olive oil) -homemade low-carb coleslaw Snacks/Desserts (try to avoid unnecessary snacking and sweets in general) -celery or cucumber dipped in guacamole or sour cream dip -cheese and meat slices  -raspberries with whipped cream (can make homemade with no sugar added) -low carb [place] butter cake  AVOID - sugar, diet/regular soda, potatoes, breads, rice, pasta, candy, cookies, cakes, muffins, juice, high carb fruit (bananas, grapes), beer, ketchup, barbeque and other sweet saucesInfluenza Virus Vaccine injection (Fluarix) What is this medicine? INFLUENZA VIRUS VACCINE (in floo EN zuh VAHY ruhs vak SEEN) helps to reduce the risk of getting influenza also known as the flu. This medicine may be used for other purposes; ask your health care provider or pharmacist if you have questions. What should I tell my health care provider before I take this medicine? They need to know if you have any of these conditions: -bleeding  disorder like hemophilia -fever or infection -Guillain-Barre syndrome or other neurological problems -immune system problems -infection with the human immunodeficiency virus (HIV) or AIDS -low blood platelet counts -multiple sclerosis -an unusual or allergic reaction to influenza virus vaccine, eggs, chicken proteins, latex, gentamicin, other medicines, foods, dyes or preservatives -pregnant or trying to get pregnant -breast-feeding How should I use this medicine? This vaccine is for injection into a muscle. It is given by a health care professional. A copy of Vaccine Information Statements will be given before each vaccination. Read this sheet carefully each time. The sheet may change frequently. Talk to your pediatrician regarding the use of this medicine in children. Special care may be needed. Overdosage: If you think you have taken too much of this medicine contact a poison control center or emergency room at once. NOTE: This medicine is only for you. Do not share this medicine with others. What if I miss a dose? This does not apply. What may interact with this medicine? -chemotherapy or radiation therapy -medicines that lower your immune system like etanercept, anakinra, infliximab, and adalimumab -medicines that treat or prevent blood clots like warfarin -phenytoin -steroid medicines like prednisone or cortisone -theophylline -vaccines This list may not describe all possible interactions. Give your health care provider a list  of all the medicines, herbs, non-prescription drugs, or dietary supplements you use. Also tell them if you smoke, drink alcohol, or use illegal drugs. Some items may interact with your medicine. What should I watch for while using this medicine? Report any side effects that do not go away within 3 days to your doctor or health care professional. Call your health care provider if any unusual symptoms occur within 6 weeks of receiving this vaccine. You may  still catch the flu, but the illness is not usually as bad. You cannot get the flu from the vaccine. The vaccine will not protect against colds or other illnesses that may cause fever. The vaccine is needed every year. What side effects may I notice from receiving this medicine? Side effects that you should report to your doctor or health care professional as soon as possible: -allergic reactions like skin rash, itching or hives, swelling of the face, lips, or tongue Side effects that usually do not require medical attention (report to your doctor or health care professional if they continue or are bothersome): -fever -headache -muscle aches and pains -pain, tenderness, redness, or swelling at site where injected -weak or tired This list may not describe all possible side effects. Call your doctor for medical advice about side effects. You may report side effects to FDA at 1-800-FDA-1088. Where should I keep my medicine? This vaccine is only given in a clinic, pharmacy, doctor's office, or other health care setting and will not be stored at home. NOTE: This sheet is a summary. It may not cover all possible information. If you have questions about this medicine, talk to your doctor, pharmacist, or health care provider.    2016, Elsevier/Gold Standard. (2008-04-18 09:30:40) Pneumococcal Polysaccharide Vaccine: What You Need to Know 1. Why get vaccinated? Vaccination can protect older adults (and some children and younger adults) from pneumococcal disease. Pneumococcal disease is caused by bacteria that can spread from person to person through close contact. It can cause ear infections, and it can also lead to more serious infections of the:   Lungs (pneumonia),  Blood (bacteremia), and  Covering of the brain and spinal cord (meningitis). Meningitis can cause deafness and brain damage, and it can be fatal. Anyone can get pneumococcal disease, but children under 39 years of age, people with  certain medical conditions, adults over 71 years of age, and cigarette smokers are at the highest risk. About 18,000 older adults die each year from pneumococcal disease in the [place]. Treatment of pneumococcal infections with penicillin and other drugs used to be more effective. But some strains of the disease have become resistant to these drugs. This makes prevention of the disease, through vaccination, even more important. 2. Pneumococcal polysaccharide vaccine (PPSV23) Pneumococcal polysaccharide vaccine (PPSV23) protects against 23 types of pneumococcal bacteria. It will not prevent all pneumococcal disease. PPSV23 is recommended for:  All adults 74 years of age and older,  Anyone 2 through 58 years of age with certain long-term health problems,  Anyone 2 through 58 years of age with a weakened immune system,  Adults 39 through 58 years of age who smoke cigarettes or have asthma. Most people need only one dose of PPSV. A second dose is recommended for certain high-risk groups. People 54 and older should get a dose even if they have gotten one or more doses of the vaccine before they turned 65. Your healthcare provider can give you more information about these recommendations. Most healthy adults develop protection within 2  to 3 weeks of getting the shot. 3. Some people should not get this vaccine  Anyone who has had a life-threatening allergic reaction to PPSV should not get another dose.  Anyone who has a severe allergy to any component of PPSV should not receive it. Tell your provider if you have any severe allergies.  Anyone who is moderately or severely ill when the shot is scheduled may be asked to wait until they recover before getting the vaccine. Someone with a mild illness can usually be vaccinated.  Children less than 49 years of age should not receive this vaccine.  There is no evidence that PPSV is harmful to either a pregnant woman or to her fetus. However, as a  precaution, women who need the vaccine should be vaccinated before becoming pregnant, if possible. 4. Risks of a vaccine reaction With any medicine, including vaccines, there is a chance of side effects. These are usually mild and go away on their own, but serious reactions are also possible. About half of people who get PPSV have mild side effects, such as redness or pain where the shot is given, which go away within about two days. Less than 1 out of 100 people develop a fever, muscle aches, or more severe local reactions. Problems that could happen after any vaccine:  People sometimes faint after a medical procedure, including vaccination. Sitting or lying down for about 15 minutes can help prevent fainting, and injuries caused by a fall. Tell your doctor if you feel dizzy, or have vision changes or ringing in the ears.  Some people get severe pain in the shoulder and have difficulty moving the arm where a shot was given. This happens very rarely.  Any medication can cause a severe allergic reaction. Such reactions from a vaccine are very rare, estimated at about 1 in a million doses, and would happen within a few minutes to a few hours after the vaccination. As with any medicine, there is a very remote chance of a vaccine causing a serious injury or death. The safety of vaccines is always being monitored. For more information, visit: http://www.aguilar.org/ 5. What if there is a serious reaction? What should I look for? Look for anything that concerns you, such as signs of a severe allergic reaction, very high fever, or unusual behavior.  Signs of a severe allergic reaction can include hives, swelling of the face and throat, difficulty breathing, a fast heartbeat, dizziness, and weakness. These would usually start a few minutes to a few hours after the vaccination. What should I do? If you think it is a severe allergic reaction or other emergency that can't wait, call 9-1-1 or get to the  nearest hospital. Otherwise, call your doctor. Afterward, the reaction should be reported to the Vaccine Adverse Event Reporting System (VAERS). Your doctor might file this report, or you can do it yourself through the VAERS web site at www.vaers.SamedayNews.es, or by calling 3604342644.  VAERS does not give medical advice. 6. How can I learn more?  Ask your doctor. He or she can give you the vaccine package insert or suggest other sources of information.  Call your local or state health department.  Contact the Centers for Disease Control and Prevention (CDC):  Call 617-559-6497 (1-800-CDC-INFO) or  Visit CDC's website at http://hunter.com/ CDC Pneumococcal Polysaccharide Vaccine VIS (01/26/14)   This information is not intended to replace advice given to you by your health care provider. Make sure you discuss any questions you have with your  health care provider.   Document Released: 07/19/2006 Document Revised: 10/12/2014 Document Reviewed: 01/29/2014 Elsevier Interactive Patient Education 2016 [employer]. Tdap Vaccine (Tetanus, Diphtheria and Pertussis): What You Need to Know 1. Why get vaccinated? Tetanus, diphtheria and pertussis are very serious diseases. Tdap vaccine can protect [name] from these diseases. And, Tdap vaccine given to pregnant women can protect newborn babies against pertussis. TETANUS (Lockjaw) is rare in the [place] today. It causes painful muscle tightening and stiffness, usually all over the body.  It can lead to tightening of muscles in the head and neck so you can't open your mouth, swallow, or sometimes even breathe. Tetanus kills about 1 out of 10 people who are infected even after receiving the best medical care. DIPHTHERIA is also rare in the [place] today. It can cause a thick coating to form in the back of the throat.  It can lead to breathing problems, heart failure, paralysis, and death. PERTUSSIS (Whooping Cough) causes severe coughing  spells, which can cause difficulty breathing, vomiting and disturbed sleep.  It can also lead to weight loss, incontinence, and rib fractures. Up to 2 in 100 adolescents and 5 in 100 adults with pertussis are hospitalized or have complications, which could include pneumonia or death. These diseases are caused by bacteria. Diphtheria and pertussis are spread from person to person through secretions from coughing or sneezing. Tetanus enters the body through cuts, scratches, or wounds. Before vaccines, as many as 200,000 cases of diphtheria, 200,000 cases of pertussis, and hundreds of cases of tetanus, were reported in the [place] each year. Since vaccination began, reports of cases for tetanus and diphtheria have dropped by about 99% and for pertussis by about 80%. 2. Tdap vaccine Tdap vaccine can protect adolescents and adults from tetanus, diphtheria, and pertussis. One dose of Tdap is routinely given at age 37 or 52. People who did not get Tdap at that age should get it as soon as possible. Tdap is especially important for healthcare professionals and anyone having close contact with a baby younger than 12 months. Pregnant women should get a dose of Tdap during every pregnancy, to protect the newborn from pertussis. Infants are most at risk for severe, life-threatening complications from pertussis. Another vaccine, called Td, protects against tetanus and diphtheria, but not pertussis. A Td booster should be given every 10 years. Tdap may be given as one of these boosters if you have never gotten Tdap before. Tdap may also be given after a severe cut or burn to prevent tetanus infection. Your doctor or the person giving you the vaccine can give you more information. Tdap may safely be given at the same time as other vaccines. 3. Some people should not get this vaccine  A person who has ever had a life-threatening allergic reaction after a previous dose of any diphtheria, tetanus or pertussis  containing vaccine, OR has a severe allergy to any part of this vaccine, should not get Tdap vaccine. Tell the person giving the vaccine about any severe allergies.  Anyone who had coma or long repeated seizures within 7 days after a childhood dose of DTP or DTaP, or a previous dose of Tdap, should not get Tdap, unless a cause other than the vaccine was found. They can still get Td.  Talk to your doctor if you:  have seizures or another nervous system problem,  had severe pain or swelling after any vaccine containing diphtheria, tetanus or pertussis,  ever had a condition called Guillain-Barr  Syndrome (GBS),  aren't feeling well on the day the shot is scheduled. 4. Risks With any medicine, including vaccines, there is a chance of side effects. These are usually mild and go away on their own. Serious reactions are also possible but are rare. Most people who get Tdap vaccine do not have any problems with it. Mild problems following Tdap (Did not interfere with activities)  Pain where the shot was given (about 3 in 4 adolescents or 2 in 3 adults)  Redness or swelling where the shot was given (about 1 person in 5)  Mild fever of at least 100.27F (up to about 1 in 25 adolescents or 1 in 100 adults)  Headache (about 3 or 4 people in 10)  Tiredness (about 1 person in 3 or 4)  Nausea, vomiting, diarrhea, stomach ache (up to 1 in 4 adolescents or 1 in 10 adults)  Chills, sore joints (about 1 person in 10)  Body aches (about 1 person in 3 or 4)  Rash, swollen glands (uncommon) Moderate problems following Tdap (Interfered with activities, but did not require medical attention)  Pain where the shot was given (up to 1 in 5 or 6)  Redness or swelling where the shot was given (up to about 1 in 16 adolescents or 1 in 12 adults)  Fever over 102F (about 1 in 100 adolescents or 1 in 250 adults)  Headache (about 1 in 7 adolescents or 1 in 10 adults)  Nausea, vomiting, diarrhea, stomach  ache (up to 1 or 3 people in 100)  Swelling of the entire arm where the shot was given (up to about 1 in 500). Severe problems following Tdap (Unable to perform usual activities; required medical attention)  Swelling, severe pain, bleeding and redness in the arm where the shot was given (rare). Problems that could happen after any vaccine:  People sometimes faint after a medical procedure, including vaccination. Sitting or lying down for about 15 minutes can help prevent fainting, and injuries caused by a fall. Tell your doctor if you feel dizzy, or have vision changes or ringing in the ears.  Some people get severe pain in the shoulder and have difficulty moving the arm where a shot was given. This happens very rarely.  Any medication can cause a severe allergic reaction. Such reactions from a vaccine are very rare, estimated at fewer than 1 in a million doses, and would happen within a few minutes to a few hours after the vaccination. As with any medicine, there is a very remote chance of a vaccine causing a serious injury or death. The safety of vaccines is always being monitored. For more information, visit: http://www.aguilar.org/ 5. What if there is a serious problem? What should I look for?  Look for anything that concerns you, such as signs of a severe allergic reaction, very high fever, or unusual behavior.  Signs of a severe allergic reaction can include hives, swelling of the face and throat, difficulty breathing, a fast heartbeat, dizziness, and weakness. These would usually start a few minutes to a few hours after the vaccination. What should I do?  If you think it is a severe allergic reaction or other emergency that can't wait, call 9-1-1 or get the person to the nearest hospital. Otherwise, call your doctor.  Afterward, the reaction should be reported to the Vaccine Adverse Event Reporting System (VAERS). Your doctor might file this report, or you can do it yourself  through the VAERS web site at www.vaers.SamedayNews.es, or by  calling 6677297884. VAERS does not give medical advice.  6. The National Vaccine Injury Compensation Program The [employer] Vaccine Injury Compensation Program (VICP) is a federal program that was created to compensate people who may have been injured by certain vaccines. Persons who believe they may have been injured by a vaccine can learn about the program and about filing a claim by calling 401-066-4925 or visiting the [place] website at GoldCloset.com.ee. There is a time limit to file a claim for compensation. 7. How can I learn more?  Ask your doctor. He or she can give you the vaccine package insert or suggest other sources of information.  Call your local or state health department.  Contact the Centers for Disease Control and Prevention (CDC):  Call (850)445-2636 (1-800-CDC-INFO) or  Visit CDC's website at http://hunter.com/ CDC Tdap Vaccine VIS (11/28/13)   This information is not intended to replace advice given to you by your health care provider. Make sure you discuss any questions you have with your health care provider.   Document Released: 03/22/2012 Document Revised: 10/12/2014 Document Reviewed: 01/03/2014 Elsevier Interactive Patient Education [employer].

## 2016-08-08 ENCOUNTER — Other Ambulatory Visit: Payer: Self-pay | Admitting: Internal Medicine

## 2016-08-08 MED ORDER — LEVOTHYROXINE SODIUM 88 MCG PO TABS: 88.0000 ug | ORAL_TABLET | Freq: Every day | ORAL | 2 refills | Status: DC

## 2016-08-11 ENCOUNTER — Telehealth: Payer: Self-pay | Admitting: Internal Medicine

## 2016-08-11 DIAGNOSIS — M4712 Other spondylosis with myelopathy, cervical region: Secondary | ICD-10-CM

## 2016-08-11 NOTE — Telephone Encounter (Signed)
I Spoke to patient the referral is for Neurosurgeon but cafa or orange card don't cover and the patient can't afford to pay out of her pocket $200 or $150 so I told Mrs Mannis to contact her pcp to see if is any other option .for her

## 2016-08-11 NOTE — Telephone Encounter (Signed)
Noted  Sent referral to [facility] [phone] address [place]. They will contact the patient for an appointment

## 2016-08-11 NOTE — Telephone Encounter (Signed)
Will forward to referral

## 2016-08-11 NOTE — Telephone Encounter (Signed)
Will forward to pcp

## 2016-08-11 NOTE — Telephone Encounter (Signed)
I put in referral to ortho to see if they can eval this.

## 2016-08-11 NOTE — Telephone Encounter (Signed)
Patient called the office to speak with PCP regarding a referral to neurologist that she was referred to. Specialist doesn't accept [facility] card or financial assistance. Please follow up. Patient wants to know who she can be referred to.  Thank you

## 2016-08-12 ENCOUNTER — Telehealth: Payer: Self-pay

## 2016-08-12 NOTE — Telephone Encounter (Signed)
Contacted pt to go over lab results. Pt didn't answer lvm asking pt to give me a call at her earliest convenience

## 2016-08-18 ENCOUNTER — Encounter: Payer: Self-pay | Admitting: Internal Medicine

## 2016-08-18 DIAGNOSIS — E063 Autoimmune thyroiditis: Secondary | ICD-10-CM

## 2016-08-18 DIAGNOSIS — M797 Fibromyalgia: Secondary | ICD-10-CM

## 2016-08-18 NOTE — Progress Notes (Signed)
Review of health records / DR Halford Chessman, MD, , [facility] - - Hx of plantar facial fibromatosis - fibromyalgia - mdd on welbutrin prior - sp echo stress test negative 2016,  - chronic polylarticular pain  - neurontin 600tid - lumbarg - chronic lymphocytic thytoiditis - per notes 07/23/14 - hypothyroid - htn - morbid obesity  - updated problem list  - mri not included / will asked cma to fu

## 2016-08-21 MED FILL — ?LEVOTHYROXINE 88 MCG TAB: 88 | 30 days supply | Qty: 30 | Fill #8

## 2016-08-21 MED FILL — ?GABAPENTIN 300 MG CAPSULE: 300 | 30 days supply | Qty: 180 | Fill #2

## 2016-08-25 ENCOUNTER — Ambulatory Visit (INDEPENDENT_AMBULATORY_CARE_PROVIDER_SITE_OTHER): Payer: Medicaid Other

## 2016-08-25 ENCOUNTER — Ambulatory Visit (INDEPENDENT_AMBULATORY_CARE_PROVIDER_SITE_OTHER): Payer: Medicaid Other | Admitting: Orthopaedic Surgery

## 2016-08-25 ENCOUNTER — Ambulatory Visit (INDEPENDENT_AMBULATORY_CARE_PROVIDER_SITE_OTHER): Payer: Self-pay

## 2016-08-25 DIAGNOSIS — M25552 Pain in left hip: Secondary | ICD-10-CM

## 2016-08-25 DIAGNOSIS — M1712 Unilateral primary osteoarthritis, left knee: Secondary | ICD-10-CM

## 2016-08-25 DIAGNOSIS — M1711 Unilateral primary osteoarthritis, right knee: Secondary | ICD-10-CM | POA: Diagnosis not present

## 2016-08-25 DIAGNOSIS — G8929 Other chronic pain: Secondary | ICD-10-CM

## 2016-08-25 DIAGNOSIS — M25562 Pain in left knee: Secondary | ICD-10-CM | POA: Diagnosis not present

## 2016-08-25 DIAGNOSIS — M25551 Pain in right hip: Secondary | ICD-10-CM | POA: Diagnosis not present

## 2016-08-25 DIAGNOSIS — M1612 Unilateral primary osteoarthritis, left hip: Secondary | ICD-10-CM

## 2016-08-25 DIAGNOSIS — M25512 Pain in left shoulder: Secondary | ICD-10-CM

## 2016-08-25 DIAGNOSIS — M25511 Pain in right shoulder: Secondary | ICD-10-CM

## 2016-08-25 DIAGNOSIS — M25561 Pain in right knee: Secondary | ICD-10-CM

## 2016-08-25 DIAGNOSIS — M1611 Unilateral primary osteoarthritis, right hip: Secondary | ICD-10-CM

## 2016-08-25 NOTE — Progress Notes (Signed)
Office Visit Note   Patient: Robin [name]           Date of Birth: August 03, 1956           MRN: [id number] Visit Date: 08/25/2016              Requested by: Maren Reamer, MD [place], [place] [id number] PCP: Maren Reamer, MD   Assessment & Plan: Visit Diagnoses:  1. Chronic pain of both knees   2. Bilateral hip pain   3. Chronic pain of both shoulders   4. Unilateral primary osteoarthritis, right knee   5. Unilateral primary osteoarthritis, left knee   6. Primary osteoarthritis of right hip   7. Primary osteoarthritis of left hip     Plan: We spent a long time today discussing each of her complaints. I recommended physical therapy and conservative treatment for her back and neck pain. There are not any good surgical solutions for this. She is very interested in pursuing a left knee replacement. We did talk about her increased BMI of 43. She understands that she is at increased risk. She has slight obstructive sleep apnea which she does use a CPAP. I gave her the option of waiting and losing weight before pursuing total knee replacement which she will think about. She will be in touch about scheduling knee replacement. I did tell her that her body habitus is not great for hip replacement given the placement of the incision. I think that her legs do not pose an increased risk for infection when it comes to the knee replacement.  Follow-Up Instructions: Return if symptoms worsen or fail to improve.   Orders:  Orders Placed This Encounter  Procedures  . XR Knee 1-2 Views Right  . XR Knee 1-2 Views Left  . XR Shoulder Left  . XR Shoulder Right  . XR HIP UNILAT W OR W/O PELVIS 2-3 VIEWS LEFT  . XR HIP UNILAT W OR W/O PELVIS 2-3 VIEWS RIGHT   No orders of the defined types were placed in this encounter.     Procedures: No procedures performed   Clinical Data: No additional findings.   Subjective: Chief Complaint  Patient presents with  . Left Knee -  Pain  . Right Knee - Pain  . Right Hip - Pain  . Left Hip - Pain  . Right Foot - Pain  . Lower Back - Pain  . Left Shoulder - Pain  . Right Shoulder - Pain  . Neck - Pain    The patient is a very pleasant 60 year old female who comes in with bilateral shoulder, bilateral hip and bilateral knee pain. She ambulates with a cane. She also endorses back pain. She has been told that she has arthritis throughout her body. She used to see Dr. Mardelle Matte at [facility] before she lost her insurance. They were supposed to do a left total knee replacement but she had personal issues that kept her from getting it done. Her husband also got sick with renal carcinoma. She has had 2 prior left knee scopes with partial temporary relief. She has failed extensive conservative treatment with by mouth NSAIDs, cortisone injections, physical therapy and she currently walks with a cane. Her main complaints are left hip and knee pain. Pain does not radiate. It is a constant throbbing aching pain that is worse with activity and better with rest    Review of Systems  Constitutional: Negative.   HENT: Negative.  Eyes: Negative.   Respiratory: Negative.   Cardiovascular: Negative.   Endocrine: Negative.   Musculoskeletal: Negative.   Neurological: Negative.   Hematological: Negative.   Psychiatric/Behavioral: Negative.   All other systems reviewed and are negative.    Objective: Vital Signs: There were no vitals taken for this visit.  Physical Exam  Constitutional: She is oriented to person, place, and time. She appears well-developed and well-nourished.  HENT:  Head: Atraumatic.  Eyes: EOM are normal.  Neck: Neck supple.  Cardiovascular: Intact distal pulses.   Pulmonary/Chest: Effort normal.  Abdominal: Soft.  Neurological: She is alert and oriented to person, place, and time.  Skin: Skin is warm. Capillary refill takes less than 2 seconds.  Psychiatric: She has a normal mood and affect. Her  behavior is normal. Judgment and thought content normal.  Nursing note and vitals reviewed.   Ortho Exam Examination of bilateral shoulder shows positive impingement signs. Her rotator cuff is grossly intact. Her before meals joint is slightly tender. Positive cross adduction. Exam of bilateral knees shows no effusion. She has significant patellar crepitus with flexion of the knee. Collaterals and cruciates are stable. She has mainly medial joint line tenderness. Exam of bilateral hips shows positive sensual sign. Pain with rotation of the hips. Lateral hip is nontender. No sciatic tension signs. Specialty Comments:  No specialty comments available.  Imaging: Xr Hip Unilat W Or W/o Pelvis 2-3 Views Left  Result Date: 08/25/2016 Advanced degenerative joint disease of bilateral hips  Xr Hip Unilat W Or W/o Pelvis 2-3 Views Right  Result Date: 08/25/2016 Advanced degenerative joint disease of bilateral hips.  Xr Knee 1-2 Views Left  Result Date: 08/25/2016 Advanced degenerative joint disease of the left knee  Xr Knee 1-2 Views Right  Result Date: 08/25/2016 Advanced degenerative joint disease right knee  Xr Shoulder Left  Result Date: 08/25/2016 Severe acromioclavicular arthropathy  Xr Shoulder Right  Result Date: 08/25/2016 No significant degenerative joint disease of the left shoulder. Severe acromioclavicular joint arthropathy.    PMFS History: Patient Active Problem List   Diagnosis Date Noted  . Fibromyalgia 08/18/2016  . Chronic lymphocytic thyroiditis 08/18/2016  . Depression 04/01/2016  . OSA (obstructive sleep apnea) 04/01/2016  . HTN (hypertension), benign 12/09/2015  . Arthritis 12/09/2015  . Hypothyroidism 12/09/2015  . Morbid obesity ([place]) 12/09/2015  . Constipation 05/27/2015  . Constipation 05/27/2015  . Fatty liver 05/27/2015  . Dysphagia, pharyngoesophageal phase   . Chest pain 03/04/2015  . Epigastric pain 07/04/2012  . Abdominal pain,  generalized 09/18/2011  . Abnormal small bowel biopsy 09/18/2011  . Lactose intolerance 09/18/2011  . Weight loss, abnormal 06/25/2011  . Anorexia 06/25/2011  . Dysuria 06/25/2011  . Bowel habit changes 06/25/2011  . GERD 01/21/2010  . DYSPHAGIA 01/21/2010   Past Medical History:  Diagnosis Date  . Anxiety disorder   . Arthritis   . Chronic neck pain   . Constipation   . Diabetes mellitus without complication ([place])   . GERD (gastroesophageal reflux disease)   . Hiatal hernia    small  . Hyperlipidemia   . Hypertension   . Schatzki's ring    non critical  . Sleep apnea    CPAP, Sleep study at [facility]    Family History  Problem Relation Age of Onset  . Kidney cancer Father   . Hypertension Father   . Hyperlipidemia Father   . Cancer Father   . Diabetes Mother   . Hypertension Mother   .  Heart Problems Mother   . Hyperlipidemia Mother   . Cancer Mother   . Liver disease Brother   . Arthritis Brother   . Hypertension Sister   . Allergic rhinitis Sister   . Stomach cancer      aunt  . Breast cancer Maternal Grandmother   . Kidney disease Maternal Grandfather   . Breast cancer Paternal Grandmother   . Hypertension Brother   . Hyperlipidemia Brother   . Hypertension Brother   . Hypertension Sister   . Hyperlipidemia Sister   . Colon cancer Neg Hx   . Asthma Neg Hx   . Eczema Neg Hx   . Immunodeficiency Neg Hx   . Urticaria Neg Hx     Past Surgical History:  Procedure Laterality Date  . ABDOMINAL HYSTERECTOMY    . ABDOMINAL HYSTERECTOMY  02/18/2000  . CARDIAC CATHETERIZATION  08/17/2001   normal L main/LAD/L Cfx/RCA (Dr. Adora Fridge)  . COLONOSCOPY  2006   Dr. [name], hyperplastic polyps  . COLONOSCOPY  08/06/11   abnormal terminal ileum for 10cm, erosions, geographical ulceration. Bx small bowel mucosa with prominent intramucosal lymphoid aggregates, slightly inflammed  . DIRECT LARYNGOSCOPY  03/15/2012   Procedure: DIRECT LARYNGOSCOPY;   Surgeon: [name], MD;  Location: [facility];  Service: ENT;  Laterality: N/A; Dr. Wolicki--:>no foreign body seen. normal esophagus to 40cm  . ESOPHAGEAL DILATION  04/17/2015   Procedure: ESOPHAGEAL DILATION;  Surgeon: [name], MD;  [facility];  Service: Endoscopy;;  . ESOPHAGOGASTRODUODENOSCOPY  08/23/2008   [id number] distal esophageal erosions consistent with mild erosive reflux esophagitis, otherwise unremarkable esophagus/ Tiny antral erosions of doubtful clinical significance, otherwise normal stomach, patent pylorus, normal D1 and D2  . ESOPHAGOGASTRODUODENOSCOPY  08/06/11   small hh, noncritical Schatzki's ring s/p 75 F  . ESOPHAGOGASTRODUODENOSCOPY N/A 04/17/2015   [id number] s/p dilation  . ESOPHAGOSCOPY  03/15/2012   Procedure: ESOPHAGOSCOPY;  Surgeon: [name], MD;  Location: [facility];  Service: ENT;  Laterality: N/A;  . KNEE ARTHROSCOPY  04/27/2001  . NM MYOCAR PERF WALL MOTION  2003   negative bruce protocol exercise tress test; EF 68%; intermediate risk study due to evidence of anterior wall ischemia extending from mid-ventricle to apex  . 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 RECTOCELE REPAIR    . TRANSTHORACIC ECHOCARDIOGRAM  2003   EF normal  . VAGINAL PROLAPSE REPAIR     Social History   Occupational History  .  Unemployed   Social History Main Topics  . Smoking status: Former Smoker    Packs/day: 0.50    Types: Cigarettes    Quit date: 04/04/1993  . Smokeless tobacco: Never Used     Comment: 17 yrs ago  . Alcohol use No  . Drug use: No  . Sexual activity: Not on file

## 2016-09-04 ENCOUNTER — Other Ambulatory Visit: Payer: Self-pay | Admitting: Internal Medicine

## 2016-09-04 MED FILL — metFORMIN HCL 500 MG TABS: 500 | 30 days supply | Qty: 30 | Fill #5

## 2016-09-08 MED FILL — LOSARTAN-HCTZ 100-25 MG TAB: 100-25 | 30 days supply | Qty: 30 | Fill #0

## 2016-09-14 ENCOUNTER — Ambulatory Visit: Payer: No Typology Code available for payment source | Admitting: Internal Medicine

## 2016-09-14 MED FILL — raNITIdine HCL 150 MG TABS: 150 | 30 days supply | Qty: 30 | Fill #2

## 2016-09-14 MED FILL — AMLODIPINE BESYLATE 5 MG TA: 5 | 30 days supply | Qty: 30 | Fill #1

## 2016-09-14 MED FILL — ?AMITRIPTYLINE HCL 50MG TAB: 50 | 30 days supply | Qty: 30 | Fill #1

## 2016-09-22 ENCOUNTER — Ambulatory Visit: Payer: Medicaid Other | Attending: Internal Medicine | Admitting: Internal Medicine

## 2016-09-22 ENCOUNTER — Encounter: Payer: Self-pay | Admitting: Internal Medicine

## 2016-09-22 VITALS — BP 157/85 | HR 84 | Temp 98.2°F | Resp 16 | Wt 293.4 lb

## 2016-09-22 DIAGNOSIS — E119 Type 2 diabetes mellitus without complications: Secondary | ICD-10-CM | POA: Insufficient documentation

## 2016-09-22 DIAGNOSIS — M1990 Unspecified osteoarthritis, unspecified site: Secondary | ICD-10-CM | POA: Diagnosis not present

## 2016-09-22 DIAGNOSIS — G8929 Other chronic pain: Secondary | ICD-10-CM | POA: Insufficient documentation

## 2016-09-22 DIAGNOSIS — R200 Anesthesia of skin: Secondary | ICD-10-CM | POA: Diagnosis not present

## 2016-09-22 DIAGNOSIS — R0600 Dyspnea, unspecified: Secondary | ICD-10-CM | POA: Diagnosis present

## 2016-09-22 DIAGNOSIS — Z7982 Long term (current) use of aspirin: Secondary | ICD-10-CM | POA: Diagnosis not present

## 2016-09-22 DIAGNOSIS — I10 Essential (primary) hypertension: Secondary | ICD-10-CM | POA: Diagnosis not present

## 2016-09-22 DIAGNOSIS — M545 Low back pain: Secondary | ICD-10-CM | POA: Insufficient documentation

## 2016-09-22 DIAGNOSIS — M542 Cervicalgia: Secondary | ICD-10-CM | POA: Diagnosis not present

## 2016-09-22 DIAGNOSIS — K449 Diaphragmatic hernia without obstruction or gangrene: Secondary | ICD-10-CM | POA: Diagnosis not present

## 2016-09-22 DIAGNOSIS — E785 Hyperlipidemia, unspecified: Secondary | ICD-10-CM | POA: Diagnosis not present

## 2016-09-22 DIAGNOSIS — M25562 Pain in left knee: Secondary | ICD-10-CM | POA: Insufficient documentation

## 2016-09-22 DIAGNOSIS — F419 Anxiety disorder, unspecified: Secondary | ICD-10-CM | POA: Insufficient documentation

## 2016-09-22 DIAGNOSIS — R7303 Prediabetes: Secondary | ICD-10-CM

## 2016-09-22 DIAGNOSIS — G4733 Obstructive sleep apnea (adult) (pediatric): Secondary | ICD-10-CM | POA: Diagnosis not present

## 2016-09-22 DIAGNOSIS — K222 Esophageal obstruction: Secondary | ICD-10-CM | POA: Insufficient documentation

## 2016-09-22 DIAGNOSIS — K219 Gastro-esophageal reflux disease without esophagitis: Secondary | ICD-10-CM | POA: Diagnosis not present

## 2016-09-22 LAB — CBC
HCT: 39.2 % (ref 35.0–45.0)
Hemoglobin: 12.4 g/dL (ref 11.7–15.5)
MCH: 28.2 pg (ref 27.0–33.0)
MCHC: 31.6 g/dL — ABNORMAL LOW (ref 32.0–36.0)
MCV: 89.3 fL (ref 80.0–100.0)
MPV: 9.1 fL (ref 7.5–12.5)
PLATELETS: 381 10*3/uL (ref 140–400)
RBC: 4.39 MIL/uL (ref 3.80–5.10)
RDW: 13 % (ref 11.0–15.0)
WBC: 6.9 10*3/uL (ref 3.8–10.8)

## 2016-09-22 LAB — GLUCOSE, POCT (MANUAL RESULT ENTRY): POC GLUCOSE: 101 mg/dL — AB (ref 70–99)

## 2016-09-22 MED ORDER — GABAPENTIN 300 MG PO CAPS: 900.0000 mg | ORAL_CAPSULE | Freq: Three times a day (TID) | ORAL | 2 refills | Status: DC

## 2016-09-22 MED ORDER — LEVOTHYROXINE SODIUM 88 MCG PO TABS: 88.0000 ug | ORAL_TABLET | Freq: Every day | ORAL | 2 refills | Status: DC

## 2016-09-22 MED ORDER — METHOCARBAMOL 500 MG PO TABS: 500.0000 mg | ORAL_TABLET | Freq: Four times a day (QID) | ORAL | 1 refills | Status: DC

## 2016-09-22 MED ORDER — NAPROXEN 500 MG PO TABS: 500.0000 mg | ORAL_TABLET | Freq: Two times a day (BID) | ORAL | 0 refills | Status: DC

## 2016-09-22 MED ORDER — TRAMADOL HCL 50 MG PO TABS: 50.0000 mg | ORAL_TABLET | Freq: Four times a day (QID) | ORAL | 0 refills | Status: DC | PRN

## 2016-09-22 MED ORDER — AMITRIPTYLINE HCL 50 MG PO TABS: 50.0000 mg | ORAL_TABLET | Freq: Every day | ORAL | 3 refills | Status: DC

## 2016-09-22 MED ORDER — AMLODIPINE BESYLATE 10 MG PO TABS: 10.0000 mg | ORAL_TABLET | Freq: Every day | ORAL | 3 refills | Status: DC

## 2016-09-22 MED ORDER — ASPIRIN 81 MG PO TABS: 81.0000 mg | ORAL_TABLET | Freq: Every day | ORAL | 3 refills | Status: DC

## 2016-09-22 MED ORDER — AMLODIPINE BESYLATE 10 MG PO TABS
10.0000 mg | ORAL_TABLET | Freq: Every day | ORAL | 3 refills | Status: DC
Start: 2016-09-22 — End: 2017-10-14

## 2016-09-22 MED ORDER — LOSARTAN POTASSIUM-HCTZ 100-25 MG PO TABS: 1.0000 | ORAL_TABLET | Freq: Every day | ORAL | 2 refills | Status: DC

## 2016-09-22 MED ORDER — BUPROPION HCL ER (XL) 300 MG PO TB24: 300.0000 mg | ORAL_TABLET | ORAL | 3 refills | Status: DC

## 2016-09-22 MED ORDER — PREDNISONE 20 MG PO TABS: 60.0000 mg | ORAL_TABLET | Freq: Every day | ORAL | 0 refills | Status: DC

## 2016-09-22 MED FILL — ?BUPROPION HCL XL 300 MG TA: 300 | 30 days supply | Qty: 30 | Fill #1

## 2016-09-22 MED FILL — traMADol HCL 50 MG TABS: 50 | 10 days supply | Qty: 60 | Fill #0

## 2016-09-22 MED FILL — LEVOTHYROXINE 88 MCG TABLET: 88 | 30 days supply | Qty: 30 | Fill #0

## 2016-09-22 MED FILL — NAPROXEN 500 MG TABLET: 500 | 15 days supply | Qty: 30 | Fill #0

## 2016-09-22 MED FILL — METHOCARBAMOL 500 MG TABLET: 500 | 12 days supply | Qty: 45 | Fill #0

## 2016-09-22 MED FILL — GABAPENTIN 300 MG CAPSULE: 300 | 20 days supply | Qty: 180 | Fill #0

## 2016-09-22 MED FILL — predniSONE 20 MG TABS: 20 | 10 days supply | Qty: 20 | Fill #0

## 2016-09-22 MED FILL — AMLODIPINE BESYLATE 10 MG T: 10 | 30 days supply | Qty: 30 | Fill #0

## 2016-09-22 MED FILL — LOSARTAN-HCTZ 100-25 MG TAB: 100-25 | 30 days supply | Qty: 30 | Fill #0

## 2016-09-22 NOTE — Progress Notes (Signed)
Robin [name], is a 60 y.o. female  [id number]  [id number]  DOB - 08-Aug-1956  Chief Complaint  Patient presents with  . Numbness        Subjective:   Robin [name] is a 60 y.o. female here today for a follow up visit.   1. C/o of worsening sob/doe x 1 month, no chest pain. Has only been using her inhaler intermittently.   No f/c/cp/cough/orthopnea, intermittent le edema as day progresses. - hx of atypical chest pain wkup w/ stress test low risk noted 6/16. Ef >65% at time.  2. Lower back /knee pains, left knee > right. Pending left knee replacement, but on hold now b/c her husband has to have surgery /further wkup on [place] node they found, he has hx of cancer.  3. C/o of bilateral numbness both feet, more often now.  Happens randomly when she is walking or sitting.  4. Lots of stressors at home, but combo of welbutrin/celexa and elavil seems to be helping her anxiety /depression.  Denies si/hi/avh.  Patient has No headache, No chest pain, No abdominal pain - No Nausea, No new weakness tingling or numbness, No Cough - SOB.  No problems updated.  ALLERGIES: No Known Allergies  PAST MEDICAL HISTORY: Past Medical History:  Diagnosis Date  . Anxiety disorder   . Arthritis   . Chronic neck pain   . Constipation   . Diabetes mellitus without complication ([place])   . GERD (gastroesophageal reflux disease)   . Hiatal hernia    small  . Hyperlipidemia   . Hypertension   . Schatzki's ring    non critical  . Sleep apnea    CPAP, Sleep study at [facility]: Prior to Admission medications   Medication Sig Start Date End Date Taking? Authorizing Provider  Albuterol Sulfate (PROAIR RESPICLICK) [id number] (90 Base) MCG/ACT AEPB Inhale 2 puffs into the lungs as needed. 08/05/16   Maren Reamer, MD  amitriptyline (ELAVIL) 50 MG tablet Take 1 tablet (50 mg total) by mouth at bedtime. 09/22/16   Maren Reamer, MD  amLODipine (NORVASC) 10 MG  tablet Take 1 tablet (10 mg total) by mouth daily. 09/22/16   Maren Reamer, MD  aspirin 81 MG tablet Take 1 tablet (81 mg total) by mouth at bedtime. Reported on 12/09/2015 09/22/16   Maren Reamer, MD  budesonide (RHINOCORT AQUA) 32 MCG/ACT nasal spray Place 2 sprays into both nostrils daily. 12/09/15   Maren Reamer, MD  buPROPion (WELLBUTRIN XL) 300 MG 24 hr tablet Take 1 tablet (300 mg total) by mouth every morning. Reported on 12/09/2015 09/22/16   Maren Reamer, MD  citalopram (CELEXA) 20 MG tablet Take 1 tablet (20 mg total) by mouth daily. 08/05/16   Maren Reamer, MD  dexlansoprazole (DEXILANT) 60 MG capsule Take 1 capsule (60 mg total) by mouth daily. 12/20/15   Argentina Donovan, PA-C  diclofenac sodium (VOLTAREN) 1 % GEL Apply 2 g topically 4 (four) times daily. 05/12/16   Maren Reamer, MD  ferrous sulfate 325 (65 FE) MG tablet TAKE 1 TABLET BY MOUTH 2 TIMES DAILY WITH A MEAL. 07/06/16   Maren Reamer, MD  fexofenadine (ALLEGRA) 180 MG tablet Take 1 tablet (180 mg total) by mouth daily. Reported on 12/09/2015 08/05/16   Maren Reamer, MD  fluticasone (FLONASE) 50 MCG/ACT nasal spray Place 2 sprays into both nostrils daily. 08/05/16   Suda Forbess T  Robin Suman, MD  gabapentin (NEURONTIN) 300 MG capsule Take 3 capsules (900 mg total) by mouth 3 (three) times daily. Needs office visit for refills 09/22/16   Maren Reamer, MD  levothyroxine (SYNTHROID, LEVOTHROID) 88 MCG tablet Take 1 tablet (88 mcg total) by mouth daily. 09/22/16   Maren Reamer, MD  loratadine (CLARITIN) 10 MG tablet Take 1 tablet (10 mg total) by mouth daily. 12/09/15   Maren Reamer, MD  losartan-hydrochlorothiazide (HYZAAR) 100-25 MG tablet Take 1 tablet by mouth daily. Reported on 12/09/2015 09/22/16   Maren Reamer, MD  lubiprostone (AMITIZA) 24 MCG capsule Take 1 capsule (24 mcg total) by mouth 2 (two) times daily as needed for constipation. 03/04/16   Maren Reamer, MD  metFORMIN (GLUCOPHAGE) 500 MG tablet  Take 0.5 tablets (250 mg total) by mouth daily with breakfast. 08/05/16   Maren Reamer, MD  methocarbamol (ROBAXIN) 500 MG tablet Take 1 tablet (500 mg total) by mouth 4 (four) times daily. 09/22/16   Maren Reamer, MD  Multiple Vitamin (MULTIVITAMIN) tablet Take 1 tablet by mouth daily. Reported on 12/09/2015 12/09/15   Maren Reamer, MD  naproxen (NAPROSYN) 500 MG tablet Take 1 tablet (500 mg total) by mouth 2 (two) times daily with a meal. 09/22/16   Maren Reamer, MD  Omega-3 Fatty Acids (FISH OIL PO) Take 1 capsule by mouth at bedtime. Reported on 12/09/2015    Historical Provider, MD  polyethylene glycol powder (GLYCOLAX/MIRALAX) powder Take 17 g by mouth 2 (two) times daily as needed. 03/05/16   Maren Reamer, MD  predniSONE (DELTASONE) 20 MG tablet Take 3 tablets (60 mg total) by mouth daily with breakfast. Day 1-3, take 60mg  (=3 tabs) qam, day 4-7 take 40mg  qam (=2 tabs), day 8-10 take 1 tab daily, than stop 09/22/16   Maren Reamer, MD  traMADol (ULTRAM) 50 MG tablet Take 1 tablet (50 mg total) by mouth every 6 (six) hours as needed. 09/22/16   Maren Reamer, MD     Objective:   Vitals:   09/22/16 1503  BP: (!) 157/85  Pulse: 84  Resp: 16  Temp: 98.2 F (36.8 C)  TempSrc: Oral  SpO2: 99%  Weight: 293 lb 6.4 oz (133.1 kg)    Exam General appearance : Awake, alert, not in any distress. Speech Clear. Not toxic looking, pleasant, morbid obese. HEENT: Atraumatic and Normocephalic, pupils equally reactive to light. Neck: supple, no JVD.   Chest:Good air entry bilaterally, no added sounds. CVS: S1 S2 regular, no murmurs/gallups or rubs. Abdomen: Bowel sounds active, obese, Non tender and not distended with no gaurding, rigidity or rebound. Foot exam: bilateral peripheral pulses 2+ (dorsalis pedis and post tibialis pulses), no ulcers noted/no ecchymosis, warm to touch, monofilament testing 3/3 bilat. Sensation intact.  No c/c/e. Neurology: Awake alert, and oriented X  3, CN II-XII grossly intact, Non focal Skin:No Rash  Data Review Lab Results  Component Value Date   HGBA1C 5.4 08/05/2016   HGBA1C 5.2 12/09/2015   HGBA1C 5.6 03/04/2015    Depression screen PHQ 2/9 09/22/2016 08/05/2016 04/01/2016 03/04/2016 12/20/2015  Decreased Interest 1 2 1 3  0  Down, Depressed, Hopeless 2 2 3 3  0  PHQ - 2 Score 3 4 4 6  0  Altered sleeping 1 2 1 1  -  Tired, decreased energy 3 3 3 3  -  Change in appetite 3 3 3 3  -  Feeling bad or failure about yourself  0 (No Data)  0 3 -  Trouble concentrating 1 2 1 1  -  Moving slowly or fidgety/restless 3 2 3 1  -  Suicidal thoughts 0 0 0 0 -  PHQ-9 Score 14 16 15 18  -  Difficult doing work/chores - - - Very difficult -      Assessment & Plan   1. Dyspnea, unspecified type Suspect body habitus big component, recd use albuterol inhaler prn - stress test 03/2015 low risk - ECHOCARDIOGRAM COMPLETE; Future - BASIC METABOLIC PANEL WITH GFR - Brain natriuretic peptide - CBC  2. HTN (hypertension), benign Elevated, increase norvasc to 10 mg qd - same hyzaar 100-25 qd  3. OSA (obstructive sleep apnea) Encouraged cpap use  5. Arthritis, djd knees, worse today, holding off on knee replacement (left) b/c now husband has to have surgery - trial short steroid course of prednisone po, ultram prn, rx for robaxin and naprosyn - Neurontin prn  6. Morbid obesity ([place]) Weight loss/increase exercise/low carb diet encourage   7. Prediabetes - POCT glucose (manual entry) - Ambulatory referral to Ophthalmology - tol metformin 250 qd  Patient have been counseled extensively about nutrition and exercise  Return in about 4 weeks (around 10/20/2016) for sob / htn.  The patient was given clear instructions to go to ER or return to medical center if symptoms don't improve, worsen or new problems develop. The patient verbalized understanding. The patient was told to call to get lab results if they haven't heard anything in the next  week.   This note has been created with Surveyor, quantity. Any transcriptional errors are unintentional.   Maren Reamer, MD, [place] and [facility] [place], [place]   09/22/2016, 3:44 PM

## 2016-09-22 NOTE — Patient Instructions (Signed)
Shortness of Breath Shortness of breath means you have trouble breathing. Shortness of breath needs medical care right away. HOME CARE   Do not smoke.  Avoid being around chemicals or things (paint fumes, dust) that may bother your breathing.  Rest as needed. Slowly begin your normal activities.  Only take medicines as told by your doctor.  Keep all doctor visits as told. GET HELP RIGHT AWAY IF:   Your shortness of breath gets worse.  You feel lightheaded, pass out (faint), or have a cough that is not helped by medicine.  You cough up blood.  You have pain with breathing.  You have pain in your chest, arms, shoulders, or belly (abdomen).  You have a fever.  You cannot walk up stairs or exercise the way you normally do.  You do not get better in the time expected.  You have a hard time doing normal activities even with rest.  You have problems with your medicines.  You have any new symptoms. MAKE SURE YOU:  Understand these instructions.  Will watch your condition.  Will get help right away if you are not doing well or get worse. This information is not intended to replace advice given to you by your health care provider. Make sure you discuss any questions you have with your health care provider. Document Released: 03/09/2008 Document Revised: 09/26/2013 Document Reviewed: 12/07/2011 Elsevier Interactive Patient Education  2017 [employer] Inc[employer]  -  Knee Pain Knee pain is a very common symptom and can have many causes. Knee pain often goes away when you follow your health care provider's instructions for relieving pain and discomfort at home. However, knee pain can develop into a condition that needs treatment. Some conditions may include:  Arthritis caused by wear and tear (osteoarthritis).  Arthritis caused by swelling and irritation (rheumatoid arthritis or gout).  A cyst or growth in your knee.  An infection in your knee joint.  An injury that will not  heal.  Damage, swelling, or irritation of the tissues that support your knee (torn ligaments or tendinitis). If your knee pain continues, additional tests may be ordered to diagnose your condition. Tests may include X-rays or other imaging studies of your knee. You may also need to have fluid removed from your knee. Treatment for ongoing knee pain depends on the cause, but treatment may include:  Medicines to relieve pain or swelling.  Steroid injections in your knee.  Physical therapy.  Surgery. HOME CARE INSTRUCTIONS  Take medicines only as directed by your health care provider.  Rest your knee and keep it raised (elevated) while you are resting.  Do not do things that cause or worsen pain.  Avoid high-impact activities or exercises, such as running, jumping rope, or doing jumping jacks.  Apply ice to the knee area:  Put ice in a plastic bag.  Place a towel between your skin and the bag.  Leave the ice on for 20 minutes, 2-3 times a day.  Ask your health care provider if you should wear an elastic knee support.  Keep a pillow under your knee when you sleep.  Lose weight if you are overweight. Extra weight can put pressure on your knee.  Do not use any tobacco products, including cigarettes, chewing tobacco, or electronic cigarettes. If you need help quitting, ask your health care provider. Smoking may slow the healing of any bone and joint problems that you may have. SEEK MEDICAL CARE IF:  Your knee pain continues, changes, or gets  worse.  You have a fever along with knee pain.  Your knee buckles or locks up.  Your knee becomes more swollen. SEEK IMMEDIATE MEDICAL CARE IF:   Your knee joint feels hot to the touch.  You have chest pain or trouble breathing. This information is not intended to replace advice given to you by your health care provider. Make sure you discuss any questions you have with your health care provider. Document Released: 07/19/2007 Document  Revised: 10/12/2014 Document Reviewed: 05/07/2014 Elsevier Interactive Patient Education  2017 [employer].

## 2016-09-23 LAB — BASIC METABOLIC PANEL WITH GFR
BUN: 9 mg/dL (ref 7–25)
CO2: 27 mmol/L (ref 20–31)
Calcium: 9.5 mg/dL (ref 8.6–10.4)
Chloride: 102 mmol/L (ref 98–110)
Creat: 0.8 mg/dL (ref 0.50–1.05)
GFR, EST NON AFRICAN AMERICAN: 82 mL/min (ref >=60)
GFR, Est African American: >89 mL/min (ref >=60)
Glucose, Bld: 91 mg/dL (ref 65–99)
POTASSIUM: 4.1 mmol/L (ref 3.5–5.3)
SODIUM: 140 mmol/L (ref 135–146)

## 2016-09-23 LAB — BRAIN NATRIURETIC PEPTIDE: BRAIN NATRIURETIC PEPTIDE: 8.8 pg/mL (ref <100)

## 2016-09-24 ENCOUNTER — Telehealth: Payer: Self-pay

## 2016-09-24 NOTE — Telephone Encounter (Signed)
Contacted pt to go over lab results pt is aware and doesn't have any questions or concerns 

## 2016-09-29 ENCOUNTER — Ambulatory Visit (HOSPITAL_COMMUNITY)
Admission: RE | Admit: 2016-09-29 | Discharge: 2016-09-29 | Disposition: A | Discharge status: Home / Self Care | Payer: Medicaid Other | Source: Ambulatory Visit | Attending: Internal Medicine | Admitting: Internal Medicine

## 2016-09-29 DIAGNOSIS — M542 Cervicalgia: Secondary | ICD-10-CM | POA: Insufficient documentation

## 2016-09-29 DIAGNOSIS — E119 Type 2 diabetes mellitus without complications: Secondary | ICD-10-CM | POA: Diagnosis not present

## 2016-09-29 DIAGNOSIS — G4730 Sleep apnea, unspecified: Secondary | ICD-10-CM | POA: Insufficient documentation

## 2016-09-29 DIAGNOSIS — E785 Hyperlipidemia, unspecified: Secondary | ICD-10-CM | POA: Diagnosis not present

## 2016-09-29 DIAGNOSIS — R0600 Dyspnea, unspecified: Secondary | ICD-10-CM | POA: Insufficient documentation

## 2016-09-29 DIAGNOSIS — E669 Obesity, unspecified: Secondary | ICD-10-CM | POA: Insufficient documentation

## 2016-09-29 DIAGNOSIS — G8929 Other chronic pain: Secondary | ICD-10-CM | POA: Diagnosis not present

## 2016-09-29 DIAGNOSIS — Z6841 Body Mass Index (BMI) 40.0 and over, adult: Secondary | ICD-10-CM | POA: Insufficient documentation

## 2016-09-29 DIAGNOSIS — I10 Essential (primary) hypertension: Secondary | ICD-10-CM | POA: Diagnosis not present

## 2016-09-29 NOTE — Progress Notes (Signed)
  Echocardiogram 2D Echocardiogram has been performed.  Robin [name] 09/29/2016, 9:13 AM

## 2016-09-30 ENCOUNTER — Ambulatory Visit (HOSPITAL_COMMUNITY)
Admission: EM | Admit: 2016-09-30 | Discharge: 2016-09-30 | Disposition: A | Discharge status: Home / Self Care | Payer: Medicaid Other | Attending: Family Medicine | Admitting: Family Medicine

## 2016-09-30 ENCOUNTER — Other Ambulatory Visit: Payer: Self-pay | Admitting: Physician Assistant

## 2016-09-30 ENCOUNTER — Other Ambulatory Visit: Payer: Self-pay | Admitting: Internal Medicine

## 2016-09-30 ENCOUNTER — Encounter (HOSPITAL_COMMUNITY): Payer: Self-pay | Admitting: Emergency Medicine

## 2016-09-30 DIAGNOSIS — J069 Acute upper respiratory infection, unspecified: Secondary | ICD-10-CM

## 2016-09-30 MED ORDER — IPRATROPIUM BROMIDE 0.06 % NA SOLN: 2.0000 | Freq: Four times a day (QID) | NASAL | 1 refills | Status: DC

## 2016-09-30 MED ORDER — GUAIFENESIN-CODEINE 100-10 MG/5ML PO SYRP: 10.0000 mL | ORAL_SOLUTION | Freq: Four times a day (QID) | ORAL | 0 refills | Status: DC | PRN

## 2016-09-30 MED FILL — IPRATROPIUM 0.06% SPRAY: 0.06 | 20 days supply | Qty: 15 | Fill #0

## 2016-09-30 MED FILL — GUAIFENESIN AC COUGH SYRUP: 100-10 | 4 days supply | Qty: 180 | Fill #0

## 2016-09-30 NOTE — Discharge Instructions (Signed)
Drink plenty of fluids as discussed, use medicine as prescribed, and mucinex or delsym for cough. Return or see your doctor if further problems °

## 2016-09-30 NOTE — ED Triage Notes (Signed)
PT reports productive cough, congestion, headache, and sore throat for 2-3 days

## 2016-09-30 NOTE — ED Provider Notes (Signed)
Robin [name]    CSN: [id number] Arrival date & time: 09/30/16  1215     History   Chief Complaint Chief Complaint  Patient presents with  . URI    HPI Robin [name] is a 60 y.o. female.   The history is provided by the patient.  URI  Presenting symptoms: congestion, cough, rhinorrhea and sore throat   Presenting symptoms: no fever   Severity:  Mild Onset quality:  Gradual Duration:  3 days Chronicity:  New Relieved by:  None tried Worsened by:  Nothing Ineffective treatments:  None tried Associated symptoms: no wheezing     Past Medical History:  Diagnosis Date  . Anxiety disorder   . Arthritis   . Chronic neck pain   . Constipation   . Diabetes mellitus without complication ([place])   . GERD (gastroesophageal reflux disease)   . Hiatal hernia    small  . Hyperlipidemia   . Hypertension   . Schatzki's ring    non critical  . Sleep apnea    CPAP, Sleep study at [facility]    Patient Active Problem List   Diagnosis Date Noted  . Fibromyalgia 08/18/2016  . Chronic lymphocytic thyroiditis 08/18/2016  . Depression 04/01/2016  . OSA (obstructive sleep apnea) 04/01/2016  . HTN (hypertension), benign 12/09/2015  . Arthritis 12/09/2015  . Hypothyroidism 12/09/2015  . Morbid obesity ([place]) 12/09/2015  . Constipation 05/27/2015  . Constipation 05/27/2015  . Fatty liver 05/27/2015  . Dysphagia, pharyngoesophageal phase   . Chest pain 03/04/2015  . Epigastric pain 07/04/2012  . Abdominal pain, generalized 09/18/2011  . Abnormal small bowel biopsy 09/18/2011  . Lactose intolerance 09/18/2011  . Weight loss, abnormal 06/25/2011  . Anorexia 06/25/2011  . Dysuria 06/25/2011  . Bowel habit changes 06/25/2011  . GERD 01/21/2010  . DYSPHAGIA 01/21/2010    Past Surgical History:  Procedure Laterality Date  . ABDOMINAL HYSTERECTOMY    . ABDOMINAL HYSTERECTOMY  02/18/2000  . CARDIAC CATHETERIZATION  08/17/2001   normal L main/LAD/L  Cfx/RCA (Dr. Adora Fridge)  . COLONOSCOPY  2006   Dr. [name], hyperplastic polyps  . COLONOSCOPY  08/06/11   abnormal terminal ileum for 10cm, erosions, geographical ulceration. Bx small bowel mucosa with prominent intramucosal lymphoid aggregates, slightly inflammed  . DIRECT LARYNGOSCOPY  03/15/2012   Procedure: DIRECT LARYNGOSCOPY;  Surgeon: [name], MD;  Location: [facility];  Service: ENT;  Laterality: N/A; Dr. Wolicki--:>no foreign body seen. normal esophagus to 40cm  . ESOPHAGEAL DILATION  04/17/2015   Procedure: ESOPHAGEAL DILATION;  Surgeon: [name], MD;  [facility];  Service: Endoscopy;;  . ESOPHAGOGASTRODUODENOSCOPY  08/23/2008   [id number] distal esophageal erosions consistent with mild erosive reflux esophagitis, otherwise unremarkable esophagus/ Tiny antral erosions of doubtful clinical significance, otherwise normal stomach, patent pylorus, normal D1 and D2  . ESOPHAGOGASTRODUODENOSCOPY  08/06/11   small hh, noncritical Schatzki's ring s/p 50 F  . ESOPHAGOGASTRODUODENOSCOPY N/A 04/17/2015   [id number] s/p dilation  . ESOPHAGOSCOPY  03/15/2012   Procedure: ESOPHAGOSCOPY;  Surgeon: [name], MD;  Location: [facility];  Service: ENT;  Laterality: N/A;  . KNEE ARTHROSCOPY  04/27/2001  . NM MYOCAR PERF WALL MOTION  2003   negative bruce protocol exercise tress test; EF 68%; intermediate risk study due to evidence of anterior wall ischemia extending from mid-ventricle to apex  . 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 RECTOCELE REPAIR    .  TRANSTHORACIC ECHOCARDIOGRAM  2003   EF normal  . VAGINAL PROLAPSE REPAIR      OB History    No data available       Home Medications    Prior to Admission medications   Medication Sig Start Date End Date Taking? Authorizing Provider  Albuterol Sulfate (PROAIR RESPICLICK) [id number] (90 Base) MCG/ACT AEPB Inhale 2 puffs into the lungs as needed. 08/05/16   Maren Reamer, MD  amitriptyline (ELAVIL) 50  MG tablet Take 1 tablet (50 mg total) by mouth at bedtime. 09/22/16   Maren Reamer, MD  amLODipine (NORVASC) 10 MG tablet Take 1 tablet (10 mg total) by mouth daily. 09/22/16   Maren Reamer, MD  aspirin 81 MG tablet Take 1 tablet (81 mg total) by mouth at bedtime. Reported on 12/09/2015 09/22/16   Maren Reamer, MD  budesonide (RHINOCORT AQUA) 32 MCG/ACT nasal spray Place 2 sprays into both nostrils daily. 12/09/15   Maren Reamer, MD  buPROPion (WELLBUTRIN XL) 300 MG 24 hr tablet Take 1 tablet (300 mg total) by mouth every morning. Reported on 12/09/2015 09/22/16   Maren Reamer, MD  citalopram (CELEXA) 20 MG tablet Take 1 tablet (20 mg total) by mouth daily. 08/05/16   Maren Reamer, MD  dexlansoprazole (DEXILANT) 60 MG capsule Take 1 capsule (60 mg total) by mouth daily. 12/20/15   Argentina Donovan, PA-C  diclofenac sodium (VOLTAREN) 1 % GEL Apply 2 g topically 4 (four) times daily. 05/12/16   Maren Reamer, MD  ferrous sulfate 325 (65 FE) MG tablet TAKE 1 TABLET BY MOUTH 2 TIMES DAILY WITH A MEAL. 07/06/16   Maren Reamer, MD  fexofenadine (ALLEGRA) 180 MG tablet Take 1 tablet (180 mg total) by mouth daily. Reported on 12/09/2015 08/05/16   Maren Reamer, MD  fluticasone (FLONASE) 50 MCG/ACT nasal spray Place 2 sprays into both nostrils daily. 08/05/16   Maren Reamer, MD  gabapentin (NEURONTIN) 300 MG capsule Take 3 capsules (900 mg total) by mouth 3 (three) times daily. Needs office visit for refills 09/22/16   Maren Reamer, MD  levothyroxine (SYNTHROID, LEVOTHROID) 88 MCG tablet Take 1 tablet (88 mcg total) by mouth daily. 09/22/16   Maren Reamer, MD  loratadine (CLARITIN) 10 MG tablet Take 1 tablet (10 mg total) by mouth daily. 12/09/15   Maren Reamer, MD  losartan-hydrochlorothiazide (HYZAAR) 100-25 MG tablet Take 1 tablet by mouth daily. Reported on 12/09/2015 09/22/16   Maren Reamer, MD  lubiprostone (AMITIZA) 24 MCG capsule Take 1 capsule (24 mcg total) by  mouth 2 (two) times daily as needed for constipation. 03/04/16   Maren Reamer, MD  metFORMIN (GLUCOPHAGE) 500 MG tablet Take 0.5 tablets (250 mg total) by mouth daily with breakfast. 08/05/16   Maren Reamer, MD  methocarbamol (ROBAXIN) 500 MG tablet Take 1 tablet (500 mg total) by mouth 4 (four) times daily. 09/22/16   Maren Reamer, MD  Multiple Vitamin (MULTIVITAMIN) tablet Take 1 tablet by mouth daily. Reported on 12/09/2015 12/09/15   Maren Reamer, MD  naproxen (NAPROSYN) 500 MG tablet Take 1 tablet (500 mg total) by mouth 2 (two) times daily with a meal. 09/22/16   Maren Reamer, MD  Omega-3 Fatty Acids (FISH OIL PO) Take 1 capsule by mouth at bedtime. Reported on 12/09/2015    Historical Provider, MD  polyethylene glycol powder (GLYCOLAX/MIRALAX) powder Take 17 g by mouth 2 (two) times daily  as needed. 03/05/16   Maren Reamer, MD  predniSONE (DELTASONE) 20 MG tablet Take 3 tablets (60 mg total) by mouth daily with breakfast. Day 1-3, take 60mg  (=3 tabs) qam, day 4-7 take 40mg  qam (=2 tabs), day 8-10 take 1 tab daily, than stop 09/22/16   Maren Reamer, MD  traMADol (ULTRAM) 50 MG tablet Take 1 tablet (50 mg total) by mouth every 6 (six) hours as needed. 09/22/16   Maren Reamer, MD    Family History Family History  Problem Relation Age of Onset  . Kidney cancer Father   . Hypertension Father   . Hyperlipidemia Father   . Cancer Father   . Diabetes Mother   . Hypertension Mother   . Heart Problems Mother   . Hyperlipidemia Mother   . Cancer Mother   . Liver disease Brother   . Arthritis Brother   . Hypertension Sister   . Allergic rhinitis Sister   . Stomach cancer      aunt  . Breast cancer Maternal Grandmother   . Kidney disease Maternal Grandfather   . Breast cancer Paternal Grandmother   . Hypertension Brother   . Hyperlipidemia Brother   . Hypertension Brother   . Hypertension Sister   . Hyperlipidemia Sister   . Colon cancer Neg Hx   . Asthma Neg Hx    . Eczema Neg Hx   . Immunodeficiency Neg Hx   . Urticaria Neg Hx     Social History Social History  Substance Use Topics  . Smoking status: Former Smoker    Packs/day: 0.50    Types: Cigarettes    Quit date: 04/04/1993  . Smokeless tobacco: Never Used     Comment: 17 yrs ago  . Alcohol use No     Allergies   Patient has no known allergies.   Review of Systems Review of Systems  Constitutional: Negative for fever.  HENT: Positive for congestion, postnasal drip, rhinorrhea and sore throat.   Respiratory: Positive for cough. Negative for shortness of breath and wheezing.   Gastrointestinal: Negative.   Genitourinary: Negative.   Neurological: Negative.   All other systems reviewed and are negative.    Physical Exam Triage Vital Signs ED Triage Vitals  Enc Vitals Group     BP 09/30/16 1324 144/70     Pulse Rate 09/30/16 1324 84     Resp 09/30/16 1324 16     Temp 09/30/16 1324 98.7 F (37.1 C)     Temp Source 09/30/16 1324 Oral     SpO2 09/30/16 1324 99 %     Weight 09/30/16 1323 293 lb (132.9 kg)     Height 09/30/16 1323 5\' 8"  (1.727 m)     Head Circumference --      Peak Flow --      Pain Score 09/30/16 1325 7     Pain Loc --      Pain Edu? --      Excl. in [place]? --    No data found.   Updated Vital Signs BP 144/70   Pulse 84   Temp 98.7 F (37.1 C) (Oral)   Resp 16   Ht 5\' 8"  (1.727 m)   Wt 293 lb (132.9 kg)   SpO2 99%   BMI 44.55 kg/m   Visual Acuity Right Eye Distance:   Left Eye Distance:   Bilateral Distance:    Right Eye Near:   Left Eye Near:    Bilateral Near:  Physical Exam  Constitutional: She is oriented to person, place, and time. She appears well-developed and well-nourished.  HENT:  Right Ear: External ear normal.  Left Ear: External ear normal.  Nose: Mucosal edema and rhinorrhea present. Right sinus exhibits no maxillary sinus tenderness. Left sinus exhibits no maxillary sinus tenderness.  Mouth/Throat: Oropharynx is  clear and moist.  Neck: Normal range of motion.  Cardiovascular: Normal rate, regular rhythm, normal heart sounds and intact distal pulses.   Pulmonary/Chest: Effort normal and breath sounds normal.  Lymphadenopathy:    She has no cervical adenopathy.  Neurological: She is alert and oriented to person, place, and time.  Skin: Skin is warm and dry.  Nursing note and vitals reviewed.    UC Treatments / Results  Labs (all labs ordered are listed, but only abnormal results are displayed) Labs Reviewed - No data to display  EKG  EKG Interpretation None       Radiology No results found.  Procedures Procedures (including critical care time)  Medications Ordered in UC Medications - No data to display   Initial Impression / Assessment and Plan / UC Course  I have reviewed the triage vital signs and the nursing notes.  Pertinent labs & imaging results that were available during my care of the patient were reviewed by me and considered in my medical decision making (see chart for details).  Clinical Course       Final Clinical Impressions(s) / UC Diagnoses   Final diagnoses:  None    New Prescriptions New Prescriptions   No medications on file     Billy Fischer, MD 10/01/16 2112

## 2016-10-06 ENCOUNTER — Ambulatory Visit
Admission: RE | Admit: 2016-10-06 | Discharge: 2016-10-06 | Disposition: A | Discharge status: Home / Self Care | Payer: Medicaid Other | Source: Ambulatory Visit | Attending: Internal Medicine | Admitting: Internal Medicine

## 2016-10-06 ENCOUNTER — Ambulatory Visit (HOSPITAL_COMMUNITY)
Admission: EM | Admit: 2016-10-06 | Discharge: 2016-10-06 | Disposition: A | Discharge status: Home / Self Care | Payer: Medicaid Other | Attending: Family Medicine | Admitting: Family Medicine

## 2016-10-06 ENCOUNTER — Encounter (HOSPITAL_COMMUNITY): Payer: Self-pay | Admitting: Family Medicine

## 2016-10-06 DIAGNOSIS — R059 Cough, unspecified: Secondary | ICD-10-CM

## 2016-10-06 DIAGNOSIS — J209 Acute bronchitis, unspecified: Secondary | ICD-10-CM

## 2016-10-06 DIAGNOSIS — Z1239 Encounter for other screening for malignant neoplasm of breast: Secondary | ICD-10-CM

## 2016-10-06 DIAGNOSIS — R05 Cough: Secondary | ICD-10-CM

## 2016-10-06 DIAGNOSIS — Z1231 Encounter for screening mammogram for malignant neoplasm of breast: Secondary | ICD-10-CM | POA: Diagnosis not present

## 2016-10-06 MED ORDER — BENZONATATE 100 MG PO CAPS: 200.0000 mg | ORAL_CAPSULE | Freq: Three times a day (TID) | ORAL | 0 refills | Status: DC | PRN

## 2016-10-06 MED ORDER — GI COCKTAIL ~~LOC~~
30.0000 mL | Freq: Once | ORAL | Status: AC
  Administered 2016-10-06: 30 mL via ORAL

## 2016-10-06 MED ORDER — AZITHROMYCIN 250 MG PO TABS: 250.0000 mg | ORAL_TABLET | Freq: Every day | ORAL | 0 refills | Status: DC

## 2016-10-06 MED ORDER — METHYLPREDNISOLONE 4 MG PO TBPK: ORAL_TABLET | ORAL | 0 refills | Status: DC

## 2016-10-06 MED ORDER — GI COCKTAIL ~~LOC~~
ORAL | Status: AC
  Filled 2016-10-06: qty 30

## 2016-10-06 MED FILL — ?AZITHROMYCIN 250 MG TABLET: 250 | 5 days supply | Qty: 6 | Fill #0

## 2016-10-06 MED FILL — METHYLPREDNISOLONE 4 MG TAB: 4 | 6 days supply | Qty: 21 | Fill #0

## 2016-10-06 MED FILL — BENZONATATE 100 MG CAPSULE: 100 | 3 days supply | Qty: 21 | Fill #0

## 2016-10-06 NOTE — ED Triage Notes (Signed)
Pt here for URI symptoms. Was seen here last week and getting worse

## 2016-10-06 NOTE — ED Provider Notes (Signed)
CSN: [id number]     Arrival date & time 10/06/16  1323 History   First MD Initiated Contact with Patient 10/06/16 1454     Chief Complaint  Patient presents with  . Cough   (Consider location/radiation/quality/duration/timing/severity/associated sxs/prior Treatment) Patient here for URI sx's for over a week.  She states she is coughing and wheezing.   The history is provided by the patient.  Cough  Cough characteristics:  Productive Sputum characteristics:  White Severity:  Moderate Onset quality:  Gradual Duration:  1 week Timing:  Constant Progression:  Worsening Chronicity:  New Smoker: no   Context: upper respiratory infection and weather changes   Relieved by:  Nothing Worsened by:  Nothing Ineffective treatments:  None tried Associated symptoms: rhinorrhea     Past Medical History:  Diagnosis Date  . Anxiety disorder   . Arthritis   . Chronic neck pain   . Constipation   . Diabetes mellitus without complication ([place])   . GERD (gastroesophageal reflux disease)   . Hiatal hernia    small  . Hyperlipidemia   . Hypertension   . Schatzki's ring    non critical  . Sleep apnea    CPAP, Sleep study at [place]   Past Surgical History:  Procedure Laterality Date  . ABDOMINAL HYSTERECTOMY    . ABDOMINAL HYSTERECTOMY  02/18/2000  . CARDIAC CATHETERIZATION  08/17/2001   normal L main/LAD/L Cfx/RCA (Dr. Adora Fridge)  . COLONOSCOPY  2006   Dr. [name], hyperplastic polyps  . COLONOSCOPY  08/06/11   abnormal terminal ileum for 10cm, erosions, geographical ulceration. Bx small bowel mucosa with prominent intramucosal lymphoid aggregates, slightly inflammed  . DIRECT LARYNGOSCOPY  03/15/2012   Procedure: DIRECT LARYNGOSCOPY;  Surgeon: [name], MD;  Location: [facility];  Service: ENT;  Laterality: N/A; Dr. Wolicki--:>no foreign body seen. normal esophagus to 40cm  . ESOPHAGEAL DILATION  04/17/2015   Procedure: ESOPHAGEAL DILATION;  Surgeon: [name], MD;  [facility];  Service: Endoscopy;;  . ESOPHAGOGASTRODUODENOSCOPY  08/23/2008   [id number] distal esophageal erosions consistent with mild erosive reflux esophagitis, otherwise unremarkable esophagus/ Tiny antral erosions of doubtful clinical significance, otherwise normal stomach, patent pylorus, normal D1 and D2  . ESOPHAGOGASTRODUODENOSCOPY  08/06/11   small hh, noncritical Schatzki's ring s/p 8 F  . ESOPHAGOGASTRODUODENOSCOPY N/A 04/17/2015   [id number] s/p dilation  . ESOPHAGOSCOPY  03/15/2012   Procedure: ESOPHAGOSCOPY;  Surgeon: [name], MD;  Location: [place];  Service: ENT;  Laterality: N/A;  . KNEE ARTHROSCOPY  04/27/2001  . NM MYOCAR PERF WALL MOTION  2003   negative bruce protocol exercise tress test; EF 68%; intermediate risk study due to evidence of anterior wall ischemia extending from mid-ventricle to apex  . PITUITARY SURGERY  06/2012   benign tumor, surgeon at [facility]  . RECTOCELE REPAIR    . TRANSTHORACIC ECHOCARDIOGRAM  2003   EF normal  . VAGINAL PROLAPSE REPAIR     Family History  Problem Relation Age of Onset  . Kidney cancer Father   . Hypertension Father   . Hyperlipidemia Father   . Cancer Father   . Diabetes Mother   . Hypertension Mother   . Heart Problems Mother   . Hyperlipidemia Mother   . Cancer Mother   . Liver disease Brother   . Arthritis Brother   . Hypertension Sister   . Allergic rhinitis Sister   . Stomach cancer      aunt  .  Breast cancer Maternal Grandmother   . Kidney disease Maternal Grandfather   . Breast cancer Paternal Grandmother   . Hypertension Brother   . Hyperlipidemia Brother   . Hypertension Brother   . Hypertension Sister   . Hyperlipidemia Sister   . Colon cancer Neg Hx   . Asthma Neg Hx   . Eczema Neg Hx   . Immunodeficiency Neg Hx   . Urticaria Neg Hx    Social History  Substance Use Topics  . Smoking status: Former Smoker    Packs/day: 0.50    Types: Cigarettes    Quit date: 04/04/1993   . Smokeless tobacco: Never Used     Comment: 17 yrs ago  . Alcohol use No   OB History    No data available     Review of Systems  Constitutional: Positive for fatigue.  HENT: Positive for postnasal drip and rhinorrhea.   Eyes: Negative.   Respiratory: Positive for cough.   Cardiovascular: Negative.   Gastrointestinal: Negative.   Endocrine: Negative.   Genitourinary: Negative.   Musculoskeletal: Negative.   Allergic/Immunologic: Negative.   Neurological: Negative.   Hematological: Negative.   Psychiatric/Behavioral: Negative.     Allergies  Patient has no known allergies.  Home Medications   Prior to Admission medications   Medication Sig Start Date End Date Taking? Authorizing Provider  Albuterol Sulfate (PROAIR RESPICLICK) [id number] (90 Base) MCG/ACT AEPB Inhale 2 puffs into the lungs as needed. 08/05/16   Maren Reamer, MD  amitriptyline (ELAVIL) 50 MG tablet Take 1 tablet (50 mg total) by mouth at bedtime. 09/22/16   Maren Reamer, MD  amLODipine (NORVASC) 10 MG tablet Take 1 tablet (10 mg total) by mouth daily. 09/22/16   Maren Reamer, MD  aspirin 81 MG tablet Take 1 tablet (81 mg total) by mouth at bedtime. Reported on 12/09/2015 09/22/16   Maren Reamer, MD  azithromycin (ZITHROMAX) 250 MG tablet Take 1 tablet (250 mg total) by mouth daily. Take first 2 tablets together, then 1 every day until finished. 10/06/16   Lysbeth Penner, FNP  benzonatate (TESSALON) 100 MG capsule Take 2 capsules (200 mg total) by mouth 3 (three) times daily as needed for cough. 10/06/16   Lysbeth Penner, FNP  budesonide (RHINOCORT AQUA) 32 MCG/ACT nasal spray Place 2 sprays into both nostrils daily. 12/09/15   Maren Reamer, MD  buPROPion (WELLBUTRIN XL) 300 MG 24 hr tablet Take 1 tablet (300 mg total) by mouth every morning. Reported on 12/09/2015 09/22/16   Maren Reamer, MD  citalopram (CELEXA) 20 MG tablet Take 1 tablet (20 mg total) by mouth daily. 08/05/16   Maren Reamer, MD   dexlansoprazole (DEXILANT) 60 MG capsule Take 1 capsule (60 mg total) by mouth daily. 12/20/15   Argentina Donovan, PA-C  diclofenac sodium (VOLTAREN) 1 % GEL Apply 2 g topically 4 (four) times daily. 05/12/16   Maren Reamer, MD  ferrous sulfate 325 (65 FE) MG tablet TAKE 1 TABLET BY MOUTH 2 TIMES DAILY WITH A MEAL. 07/06/16   Maren Reamer, MD  fexofenadine (ALLEGRA) 180 MG tablet Take 1 tablet (180 mg total) by mouth daily. Reported on 12/09/2015 08/05/16   Maren Reamer, MD  fluticasone (FLONASE) 50 MCG/ACT nasal spray Place 2 sprays into both nostrils daily. 08/05/16   Maren Reamer, MD  gabapentin (NEURONTIN) 300 MG capsule Take 3 capsules (900 mg total) by mouth 3 (three) times daily. Needs office visit  for refills 09/22/16   Maren Reamer, MD  guaiFENesin-codeine [facility]) 100-10 MG/5ML syrup Take 10 mLs by mouth 4 (four) times daily as needed for cough. 09/30/16   Billy Fischer, MD  ipratropium (ATROVENT) 0.06 % nasal spray Place 2 sprays into both nostrils 4 (four) times daily. 09/30/16   Billy Fischer, MD  levothyroxine (SYNTHROID, LEVOTHROID) 88 MCG tablet Take 1 tablet (88 mcg total) by mouth daily. 09/22/16   Maren Reamer, MD  loratadine (CLARITIN) 10 MG tablet Take 1 tablet (10 mg total) by mouth daily. 12/09/15   Maren Reamer, MD  losartan-hydrochlorothiazide (HYZAAR) 100-25 MG tablet Take 1 tablet by mouth daily. Reported on 12/09/2015 09/22/16   Maren Reamer, MD  lubiprostone (AMITIZA) 24 MCG capsule Take 1 capsule (24 mcg total) by mouth 2 (two) times daily as needed for constipation. 03/04/16   Maren Reamer, MD  metFORMIN (GLUCOPHAGE) 500 MG tablet Take 0.5 tablets (250 mg total) by mouth daily with breakfast. 08/05/16   Maren Reamer, MD  methocarbamol (ROBAXIN) 500 MG tablet Take 1 tablet (500 mg total) by mouth 4 (four) times daily. 09/22/16   Maren Reamer, MD  methylPREDNISolone (MEDROL DOSEPAK) 4 MG TBPK tablet Take 6-5-4-3-2-1 po qd 10/06/16    Lysbeth Penner, FNP  Multiple Vitamin (MULTIVITAMIN) tablet Take 1 tablet by mouth daily. Reported on 12/09/2015 12/09/15   Maren Reamer, MD  naproxen (NAPROSYN) 500 MG tablet Take 1 tablet (500 mg total) by mouth 2 (two) times daily with a meal. 09/22/16   Maren Reamer, MD  Omega-3 Fatty Acids (FISH OIL PO) Take 1 capsule by mouth at bedtime. Reported on 12/09/2015    Historical Provider, MD  polyethylene glycol powder (GLYCOLAX/MIRALAX) powder Take 17 g by mouth 2 (two) times daily as needed. 03/05/16   Maren Reamer, MD  predniSONE (DELTASONE) 20 MG tablet Take 3 tablets (60 mg total) by mouth daily with breakfast. Day 1-3, take 60mg  (=3 tabs) qam, day 4-7 take 40mg  qam (=2 tabs), day 8-10 take 1 tab daily, than stop 09/22/16   Maren Reamer, MD  traMADol (ULTRAM) 50 MG tablet Take 1 tablet (50 mg total) by mouth every 6 (six) hours as needed. 09/22/16   Maren Reamer, MD   Meds Ordered and Administered this Visit   Medications  gi cocktail (Maalox,Lidocaine,Donnatal) (30 mLs Oral Given 10/06/16 1512)    BP 158/72   Pulse 93   Temp 98.5 F (36.9 C) (Oral)   Resp 18   SpO2 100%  No data found.   Physical Exam  Constitutional: She appears well-developed and well-nourished.  HENT:  Head: Normocephalic and atraumatic.  Right Ear: External ear normal.  Left Ear: External ear normal.  Mouth/Throat: Oropharynx is clear and moist.  Eyes: Conjunctivae are normal. Pupils are equal, round, and reactive to light.  Neck: Normal range of motion. Neck supple.  Cardiovascular: Normal rate, regular rhythm and normal heart sounds.   Pulmonary/Chest: Effort normal. She has wheezes.  Abdominal: Soft.  Nursing note and vitals reviewed.   Urgent Care Course   Clinical Course     Procedures (including critical care time)  Labs Review Labs Reviewed - No data to display  Imaging Review No results found.   Visual Acuity Review  Right Eye Distance:   Left Eye Distance:    Bilateral Distance:    Right Eye Near:   Left Eye Near:    Bilateral Near:  MDM   1. Bronchitis - Zpak, medrol dose pack 4mg , tessalon perles Push po fluids, rest, tylenol and motrin otc prn as directed for fever, arthralgias, and myalgias.  Follow up prn if sx's continue or persist.    Lysbeth Penner, FNP 10/06/16 1530

## 2016-10-07 ENCOUNTER — Other Ambulatory Visit: Payer: Self-pay | Admitting: Internal Medicine

## 2016-10-07 DIAGNOSIS — R928 Other abnormal and inconclusive findings on diagnostic imaging of breast: Secondary | ICD-10-CM

## 2016-10-13 ENCOUNTER — Ambulatory Visit
Admission: RE | Admit: 2016-10-13 | Discharge: 2016-10-13 | Disposition: A | Discharge status: Home / Self Care | Payer: Medicaid Other | Source: Ambulatory Visit | Attending: Internal Medicine | Admitting: Internal Medicine

## 2016-10-13 DIAGNOSIS — R928 Other abnormal and inconclusive findings on diagnostic imaging of breast: Secondary | ICD-10-CM

## 2016-10-14 ENCOUNTER — Telehealth: Payer: Self-pay

## 2016-10-14 NOTE — Telephone Encounter (Signed)
Contacted pt to go over echo results pt didn't answer lvm asking pt to give me a call back at her earliest convenience

## 2016-10-19 ENCOUNTER — Ambulatory Visit: Payer: Medicaid Other | Attending: Internal Medicine | Admitting: Internal Medicine

## 2016-10-19 ENCOUNTER — Encounter: Payer: Self-pay | Admitting: Internal Medicine

## 2016-10-19 VITALS — BP 124/77 | HR 90 | Temp 97.8°F | Resp 16 | Wt 297.6 lb

## 2016-10-19 DIAGNOSIS — G4733 Obstructive sleep apnea (adult) (pediatric): Secondary | ICD-10-CM | POA: Diagnosis not present

## 2016-10-19 DIAGNOSIS — E119 Type 2 diabetes mellitus without complications: Secondary | ICD-10-CM | POA: Diagnosis not present

## 2016-10-19 DIAGNOSIS — I10 Essential (primary) hypertension: Secondary | ICD-10-CM

## 2016-10-19 DIAGNOSIS — M542 Cervicalgia: Secondary | ICD-10-CM | POA: Insufficient documentation

## 2016-10-19 DIAGNOSIS — G8929 Other chronic pain: Secondary | ICD-10-CM | POA: Diagnosis not present

## 2016-10-19 DIAGNOSIS — E785 Hyperlipidemia, unspecified: Secondary | ICD-10-CM | POA: Diagnosis not present

## 2016-10-19 DIAGNOSIS — K219 Gastro-esophageal reflux disease without esophagitis: Secondary | ICD-10-CM | POA: Insufficient documentation

## 2016-10-19 DIAGNOSIS — J069 Acute upper respiratory infection, unspecified: Secondary | ICD-10-CM | POA: Diagnosis not present

## 2016-10-19 DIAGNOSIS — Z7984 Long term (current) use of oral hypoglycemic drugs: Secondary | ICD-10-CM | POA: Diagnosis not present

## 2016-10-19 DIAGNOSIS — Z7982 Long term (current) use of aspirin: Secondary | ICD-10-CM | POA: Insufficient documentation

## 2016-10-19 DIAGNOSIS — Z6841 Body Mass Index (BMI) 40.0 and over, adult: Secondary | ICD-10-CM | POA: Insufficient documentation

## 2016-10-19 DIAGNOSIS — M1990 Unspecified osteoarthritis, unspecified site: Secondary | ICD-10-CM | POA: Diagnosis not present

## 2016-10-19 DIAGNOSIS — R0602 Shortness of breath: Secondary | ICD-10-CM | POA: Insufficient documentation

## 2016-10-19 DIAGNOSIS — K222 Esophageal obstruction: Secondary | ICD-10-CM | POA: Insufficient documentation

## 2016-10-19 DIAGNOSIS — F419 Anxiety disorder, unspecified: Secondary | ICD-10-CM | POA: Insufficient documentation

## 2016-10-19 LAB — GLUCOSE, POCT (MANUAL RESULT ENTRY): POC Glucose: 100 mg/dl — AB (ref 70–99)

## 2016-10-19 MED FILL — raNITIdine HCL 150 MG TABS: 150 | 30 days supply | Qty: 30 | Fill #3

## 2016-10-19 MED FILL — metFORMIN HCL 500 MG TABS: 500 | 30 days supply | Qty: 30 | Fill #6

## 2016-10-19 MED FILL — ?AMITRIPTYLINE HCL 50MG TAB: 50 | 30 days supply | Qty: 30 | Fill #2

## 2016-10-19 NOTE — Patient Instructions (Signed)
Low-Sodium Eating Plan Sodium raises blood pressure and causes water to be held in the body. Getting less sodium from food will help lower your blood pressure, reduce any swelling, and protect your heart, liver, and kidneys. We get sodium by adding salt (sodium chloride) to food. Most of our sodium comes from canned, boxed, and frozen foods. Restaurant foods, fast foods, and pizza are also very high in sodium. Even if you take medicine to lower your blood pressure or to reduce fluid in your body, getting less sodium from your food is important. What is my plan? Most people should limit their sodium intake to 2,300 mg a day. Your health care provider recommends that you limit your sodium intake to < 2,000mg  a day. What do I need to know about this eating plan? For the low-sodium eating plan, you will follow these general guidelines:  Choose foods with a % Daily Value for sodium of less than 5% (as listed on the food label).  Use salt-free seasonings or herbs instead of table salt or sea salt.  Check with your health care provider or pharmacist before using salt substitutes.  Eat fresh foods.  Eat more vegetables and fruits.  Limit canned vegetables. If you do use them, rinse them well to decrease the sodium.  Limit cheese to 1 oz (28 g) per day.  Eat lower-sodium products, often labeled as "lower sodium" or "no salt added."  Avoid foods that contain monosodium glutamate (MSG). MSG is sometimes added to [place] food and some canned foods.  Check food labels (Nutrition Facts labels) on foods to learn how much sodium is in one serving.  Eat more home-cooked food and less restaurant, buffet, and fast food.  When eating at a restaurant, ask that your food be prepared with less salt, or no salt if possible. How do I read food labels for sodium information? The Nutrition Facts label lists the amount of sodium in one serving of the food. If you eat more than one serving, you must multiply the  listed amount of sodium by the number of servings. Food labels may also identify foods as:  Sodium free-Less than 5 mg in a serving.  Very low sodium-35 mg or less in a serving.  Low sodium-140 mg or less in a serving.  Light in sodium-50% less sodium in a serving. For example, if a food that usually has 300 mg of sodium is changed to become light in sodium, it will have 150 mg of sodium.  Reduced sodium-25% less sodium in a serving. For example, if a food that usually has 400 mg of sodium is changed to reduced sodium, it will have 300 mg of sodium. What foods can I eat? Grains  Low-sodium cereals, including oats, puffed wheat and rice, and shredded wheat cereals. Low-sodium crackers. Unsalted rice and pasta. Lower-sodium bread. Vegetables  Frozen or fresh vegetables. Low-sodium or reduced-sodium canned vegetables. Low-sodium or reduced-sodium tomato sauce and paste. Low-sodium or reduced-sodium tomato and vegetable juices. Fruits  Fresh, frozen, and canned fruit. Fruit juice. Meat and Other Protein Products  Low-sodium canned tuna and salmon. Fresh or frozen meat, poultry, seafood, and fish. Lamb. Unsalted nuts. Dried beans, peas, and lentils without added salt. Unsalted canned beans. Homemade soups without salt. Eggs. Dairy  Milk. Soy milk. Ricotta cheese. Low-sodium or reduced-sodium cheeses. Yogurt. Condiments  Fresh and dried herbs and spices. Salt-free seasonings. Onion and garlic powders. Low-sodium varieties of mustard and ketchup. Fresh or refrigerated horseradish. Lemon juice. Fats and Oils  Reduced-sodium salad dressings. Unsalted butter. Other  Unsalted popcorn and pretzels. The items listed above may not be a complete list of recommended foods or beverages. Contact your dietitian for more options.  What foods are not recommended? Grains  Instant hot cereals. Bread stuffing, pancake, and biscuit mixes. Croutons. Seasoned rice or pasta mixes. Noodle soup cups. Boxed or  frozen macaroni and cheese. Self-rising flour. Regular salted crackers. Vegetables  Regular canned vegetables. Regular canned tomato sauce and paste. Regular tomato and vegetable juices. Frozen vegetables in sauces. Salted [place] fries. Olives. Angie Fava. Relishes. Sauerkraut. Salsa. Meat and Other Protein Products  Salted, canned, smoked, spiced, or pickled meats, seafood, or fish. Bacon, ham, sausage, hot dogs, corned beef, chipped beef, and packaged luncheon meats. Salt pork. Jerky. Pickled herring. Anchovies, regular canned tuna, and sardines. Salted nuts. Dairy  Processed cheese and cheese spreads. Cheese curds. Blue cheese and cottage cheese. Buttermilk. Condiments  Onion and garlic salt, seasoned salt, table salt, and sea salt. Canned and packaged gravies. Worcestershire sauce. Tartar sauce. Barbecue sauce. Teriyaki sauce. Soy sauce, including reduced sodium. Steak sauce. Fish sauce. Oyster sauce. Cocktail sauce. Horseradish that you find on the shelf. Regular ketchup and mustard. Meat flavorings and tenderizers. Bouillon cubes. Hot sauce. Tabasco sauce. Marinades. Taco seasonings. Relishes. Fats and Oils  Regular salad dressings. Salted butter. Margarine. Ghee. Bacon fat. Other  Potato and tortilla chips. Corn chips and puffs. Salted popcorn and pretzels. Canned or dried soups. Pizza. Frozen entrees and pot pies. The items listed above may not be a complete list of foods and beverages to avoid. Contact your dietitian for more information.  This information is not intended to replace advice given to you by your health care provider. Make sure you discuss any questions you have with your health care provider. Document Released: 03/13/2002 Document Revised: 02/27/2016 Document Reviewed: 07/26/2013 Elsevier Interactive Patient Education  2017 [employer].

## 2016-10-19 NOTE — Progress Notes (Signed)
Robin [name], is a 61 y.o. female  [id number]  [id number]  DOB - July 03, 1956  Chief Complaint  Patient presents with  . Hypertension  . Shortness of Breath        Subjective:   Robin [name] is a 61 y.o. female here today for a follow up visit for sob.  Last seen in Dec. Since than, she was seen in urgent care 12/27 and 10/06/16 for bronchitis, rx zpack and steroids. Feels much better overall, still w/ intermittent prod cough and sob, but nothing like prior. Still c/o of doe.  Has gained 5 lbs since I last saw her, she says she is a stress eater.  Pending knee surgery if she could lose at least 20lbs - 40lbs she said.   Per pt, hx of "asthma" but no formal diagnosis w/ pfts.  Per pt, use of albuterol mdi did not seem to make much difference in her sob.   Patient has No headache, No chest pain, No abdominal pain - No Nausea, No new weakness tingling or numbness, No Cough - SOB.  Problem  Weight Loss, Abnormal (Resolved)    ALLERGIES: No Known Allergies  PAST MEDICAL HISTORY: Past Medical History:  Diagnosis Date  . Anxiety disorder   . Arthritis   . Chronic neck pain   . Constipation   . Diabetes mellitus without complication ([place])   . GERD (gastroesophageal reflux disease)   . Hiatal hernia    small  . Hyperlipidemia   . Hypertension   . Schatzki's ring    non critical  . Sleep apnea    CPAP, Sleep study at [facility]: Prior to Admission medications   Medication Sig Start Date End Date Taking? Authorizing Provider  Albuterol Sulfate (PROAIR RESPICLICK) 123XX123 (90 Base) MCG/ACT AEPB Inhale 2 puffs into the lungs as needed. 08/05/16   Maren Reamer, MD  amitriptyline (ELAVIL) 50 MG tablet Take 1 tablet (50 mg total) by mouth at bedtime. 09/22/16   Maren Reamer, MD  amLODipine (NORVASC) 10 MG tablet Take 1 tablet (10 mg total) by mouth daily. 09/22/16   Maren Reamer, MD  aspirin 81 MG tablet Take 1 tablet (81 mg  total) by mouth at bedtime. Reported on 12/09/2015 09/22/16   Maren Reamer, MD  azithromycin (ZITHROMAX) 250 MG tablet Take 1 tablet (250 mg total) by mouth daily. Take first 2 tablets together, then 1 every day until finished. 10/06/16   Lysbeth Penner, FNP  benzonatate (TESSALON) 100 MG capsule Take 2 capsules (200 mg total) by mouth 3 (three) times daily as needed for cough. 10/06/16   Lysbeth Penner, FNP  budesonide (RHINOCORT AQUA) 32 MCG/ACT nasal spray Place 2 sprays into both nostrils daily. 12/09/15   Maren Reamer, MD  buPROPion (WELLBUTRIN XL) 300 MG 24 hr tablet Take 1 tablet (300 mg total) by mouth every morning. Reported on 12/09/2015 09/22/16   Maren Reamer, MD  citalopram (CELEXA) 20 MG tablet Take 1 tablet (20 mg total) by mouth daily. 08/05/16   Maren Reamer, MD  dexlansoprazole (DEXILANT) 60 MG capsule Take 1 capsule (60 mg total) by mouth daily. 12/20/15   Argentina Donovan, PA-C  diclofenac sodium (VOLTAREN) 1 % GEL Apply 2 g topically 4 (four) times daily. 05/12/16   Maren Reamer, MD  ferrous sulfate 325 (65 FE) MG tablet TAKE 1 TABLET BY MOUTH 2 TIMES DAILY WITH A MEAL. 07/06/16  Maren Reamer, MD  fexofenadine (ALLEGRA) 180 MG tablet Take 1 tablet (180 mg total) by mouth daily. Reported on 12/09/2015 08/05/16   Maren Reamer, MD  fluticasone (FLONASE) 50 MCG/ACT nasal spray Place 2 sprays into both nostrils daily. 08/05/16   Maren Reamer, MD  gabapentin (NEURONTIN) 300 MG capsule Take 3 capsules (900 mg total) by mouth 3 (three) times daily. Needs office visit for refills 09/22/16   Maren Reamer, MD  guaiFENesin-codeine [facility]) 100-10 MG/5ML syrup Take 10 mLs by mouth 4 (four) times daily as needed for cough. 09/30/16   Billy Fischer, MD  ipratropium (ATROVENT) 0.06 % nasal spray Place 2 sprays into both nostrils 4 (four) times daily. 09/30/16   Billy Fischer, MD  levothyroxine (SYNTHROID, LEVOTHROID) 88 MCG tablet Take 1 tablet (88 mcg total) by mouth  daily. 09/22/16   Maren Reamer, MD  loratadine (CLARITIN) 10 MG tablet Take 1 tablet (10 mg total) by mouth daily. 12/09/15   Maren Reamer, MD  losartan-hydrochlorothiazide (HYZAAR) 100-25 MG tablet Take 1 tablet by mouth daily. Reported on 12/09/2015 09/22/16   Maren Reamer, MD  lubiprostone (AMITIZA) 24 MCG capsule Take 1 capsule (24 mcg total) by mouth 2 (two) times daily as needed for constipation. 03/04/16   Maren Reamer, MD  metFORMIN (GLUCOPHAGE) 500 MG tablet Take 0.5 tablets (250 mg total) by mouth daily with breakfast. 08/05/16   Maren Reamer, MD  methocarbamol (ROBAXIN) 500 MG tablet Take 1 tablet (500 mg total) by mouth 4 (four) times daily. 09/22/16   Maren Reamer, MD  methylPREDNISolone (MEDROL DOSEPAK) 4 MG TBPK tablet Take 6-5-4-3-2-1 po qd 10/06/16   Lysbeth Penner, FNP  Multiple Vitamin (MULTIVITAMIN) tablet Take 1 tablet by mouth daily. Reported on 12/09/2015 12/09/15   Maren Reamer, MD  naproxen (NAPROSYN) 500 MG tablet Take 1 tablet (500 mg total) by mouth 2 (two) times daily with a meal. 09/22/16   Maren Reamer, MD  Omega-3 Fatty Acids (FISH OIL PO) Take 1 capsule by mouth at bedtime. Reported on 12/09/2015    Historical Provider, MD  polyethylene glycol powder (GLYCOLAX/MIRALAX) powder Take 17 g by mouth 2 (two) times daily as needed. 03/05/16   Maren Reamer, MD  predniSONE (DELTASONE) 20 MG tablet Take 3 tablets (60 mg total) by mouth daily with breakfast. Day 1-3, take 60mg  (=3 tabs) qam, day 4-7 take 40mg  qam (=2 tabs), day 8-10 take 1 tab daily, than stop 09/22/16   Maren Reamer, MD  traMADol (ULTRAM) 50 MG tablet Take 1 tablet (50 mg total) by mouth every 6 (six) hours as needed. 09/22/16   Maren Reamer, MD     Objective:   Vitals:   10/19/16 1528  BP: 124/77  Pulse: 90  Resp: 16  Temp: 97.8 F (36.6 C)  TempSrc: Oral  SpO2: 98%  Weight: 297 lb 9.6 oz (135 kg)    Exam General appearance : Awake, alert, not in any distress.  Speech Clear. Not toxic looking, morbid obese, pleasant HEENT: Atraumatic and Normocephalic, pupils equally reactive to light. Neck: supple, no JVD. No cervical lymphadenopathy.  Chest:Good air entry bilaterally, no added sounds. CVS: S1 S2 regular, no murmurs/gallups or rubs. Abdomen: Bowel sounds active, obese, Non tender and not distended with no gaurding, rigidity or rebound. Extremities: B/L Lower Ext shows no edema, both legs are warm to touch Neurology: Awake alert, and oriented X 3, CN II-XII grossly intact,  Non focal Skin:No Rash  Data Review Lab Results  Component Value Date   HGBA1C 5.4 08/05/2016   HGBA1C 5.2 12/09/2015   HGBA1C 5.6 03/04/2015    Depression screen PHQ 2/9 10/19/2016 09/22/2016 08/05/2016 04/01/2016 03/04/2016  Decreased Interest (No Data) 1 2 1 3   Down, Depressed, Hopeless (No Data) 2 2 3 3   PHQ - 2 Score - 3 4 4 6   Altered sleeping (No Data) 1 2 1 1   Tired, decreased energy (No Data) 3 3 3 3   Change in appetite (No Data) 3 3 3 3   Feeling bad or failure about yourself  (No Data) 0 (No Data) 0 3  Trouble concentrating (No Data) 1 2 1 1   Moving slowly or fidgety/restless (No Data) 3 2 3 1   Suicidal thoughts (No Data) 0 0 0 0  PHQ-9 Score - 14 16 15 18   Difficult doing work/chores - - - - Very difficult      Assessment & Plan   1. Acute upper respiratory infection - resolved, w/ persistent sob./doe Resolved. Still w/ residual rare cough, and sob/doe - which has been ongoing since I saw in Dec - echo 09/29/16 wnl, stress test 6/16 neg as well - will get formal PFTs to eval,  - suspect doe due to body habitus but will r/o asthma/ostructive component, does not smoke.  2. HTN (hypertension), benign Repeat bp nml, suspect initial bp due to stress/rushing. Same bp rx.  3. OSA (obstructive sleep apnea) Encouraged used cpap  4. SOB (shortness of breath) See #1 - Pulmonary function test; Future - may need pulm referral depending on pfts.  5. Morbid  obesity ([place]) Again urged low carb diet/increase activity.      Patient have been counseled extensively about nutrition and exercise  Return in about 3 days (around 10/22/2016), or if symptoms worsen or fail to improve.  The patient was given clear instructions to go to ER or return to medical center if symptoms don't improve, worsen or new problems develop. The patient verbalized understanding. The patient was told to call to get lab results if they haven't heard anything in the next week.   This note has been created with Surveyor, quantity. Any transcriptional errors are unintentional.   Maren Reamer, MD, [facility] [place], [place]   10/19/2016, 4:11 PM

## 2016-10-26 MED FILL — AMLODIPINE BESYLATE 10 MG T: 10 | 30 days supply | Qty: 30 | Fill #1

## 2016-10-26 MED FILL — ?BUPROPION HCL XL 300 MG TA: 300 | 30 days supply | Qty: 30 | Fill #2

## 2016-10-26 MED FILL — GABAPENTIN 300 MG CAPSULE: 300 | 20 days supply | Qty: 180 | Fill #1

## 2016-10-30 ENCOUNTER — Telehealth: Payer: Self-pay | Admitting: Internal Medicine

## 2016-10-30 NOTE — Telephone Encounter (Signed)
Pt calling to follow up on referral to pulmonologist  States referral was discussed in last OV with PCP   Did not see referral in chart. Informed pt when referral has been processed she will receive a call from referral coordinator or specialty office

## 2016-11-02 ENCOUNTER — Other Ambulatory Visit: Payer: Self-pay | Admitting: Internal Medicine

## 2016-11-02 MED FILL — LEVOTHYROXINE 88 MCG TABLET: 88 | 30 days supply | Qty: 30 | Fill #1

## 2016-11-03 MED FILL — traMADol HCL 50 MG TABS: 50 | 15 days supply | Qty: 60 | Fill #0

## 2016-11-04 ENCOUNTER — Other Ambulatory Visit: Payer: Self-pay | Admitting: Pharmacist

## 2016-11-11 MED FILL — LOSARTAN-HCTZ 100-25 MG TAB: 100-25 | 30 days supply | Qty: 30 | Fill #1

## 2016-11-16 MED FILL — ?AMITRIPTYLINE HCL 50MG TAB: 50 | 30 days supply | Qty: 30 | Fill #3

## 2016-11-16 MED FILL — raNITIdine HCL 150 MG TABS: 150 | 30 days supply | Qty: 30 | Fill #4

## 2016-11-18 ENCOUNTER — Ambulatory Visit: Payer: Medicaid Other | Attending: Internal Medicine | Admitting: Internal Medicine

## 2016-11-18 ENCOUNTER — Encounter: Payer: Self-pay | Admitting: Internal Medicine

## 2016-11-18 VITALS — BP 152/88 | HR 86 | Temp 97.9°F | Resp 16 | Wt 298.6 lb

## 2016-11-18 DIAGNOSIS — G8929 Other chronic pain: Secondary | ICD-10-CM | POA: Insufficient documentation

## 2016-11-18 DIAGNOSIS — R0602 Shortness of breath: Secondary | ICD-10-CM

## 2016-11-18 DIAGNOSIS — Z7982 Long term (current) use of aspirin: Secondary | ICD-10-CM | POA: Diagnosis not present

## 2016-11-18 DIAGNOSIS — K222 Esophageal obstruction: Secondary | ICD-10-CM | POA: Diagnosis not present

## 2016-11-18 DIAGNOSIS — Z794 Long term (current) use of insulin: Secondary | ICD-10-CM | POA: Insufficient documentation

## 2016-11-18 DIAGNOSIS — K449 Diaphragmatic hernia without obstruction or gangrene: Secondary | ICD-10-CM | POA: Insufficient documentation

## 2016-11-18 DIAGNOSIS — R600 Localized edema: Secondary | ICD-10-CM | POA: Diagnosis not present

## 2016-11-18 DIAGNOSIS — E1165 Type 2 diabetes mellitus with hyperglycemia: Secondary | ICD-10-CM | POA: Diagnosis not present

## 2016-11-18 DIAGNOSIS — R05 Cough: Secondary | ICD-10-CM | POA: Insufficient documentation

## 2016-11-18 DIAGNOSIS — M1990 Unspecified osteoarthritis, unspecified site: Secondary | ICD-10-CM | POA: Insufficient documentation

## 2016-11-18 DIAGNOSIS — K219 Gastro-esophageal reflux disease without esophagitis: Secondary | ICD-10-CM | POA: Diagnosis not present

## 2016-11-18 DIAGNOSIS — G4733 Obstructive sleep apnea (adult) (pediatric): Secondary | ICD-10-CM

## 2016-11-18 DIAGNOSIS — R739 Hyperglycemia, unspecified: Secondary | ICD-10-CM | POA: Diagnosis not present

## 2016-11-18 DIAGNOSIS — I10 Essential (primary) hypertension: Secondary | ICD-10-CM

## 2016-11-18 DIAGNOSIS — E118 Type 2 diabetes mellitus with unspecified complications: Secondary | ICD-10-CM

## 2016-11-18 DIAGNOSIS — M542 Cervicalgia: Secondary | ICD-10-CM | POA: Insufficient documentation

## 2016-11-18 DIAGNOSIS — F419 Anxiety disorder, unspecified: Secondary | ICD-10-CM | POA: Diagnosis not present

## 2016-11-18 DIAGNOSIS — E785 Hyperlipidemia, unspecified: Secondary | ICD-10-CM | POA: Insufficient documentation

## 2016-11-18 LAB — GLUCOSE, POCT (MANUAL RESULT ENTRY): POC GLUCOSE: 118 mg/dL — AB (ref 70–99)

## 2016-11-18 MED ORDER — FLUTICASONE PROPIONATE 50 MCG/ACT NA SUSP: 2.0000 | Freq: Every day | NASAL | 6 refills | Status: DC

## 2016-11-18 MED ORDER — FEXOFENADINE HCL 180 MG PO TABS: 180.0000 mg | ORAL_TABLET | Freq: Every day | ORAL | 3 refills | Status: DC

## 2016-11-18 MED ORDER — HYDRALAZINE HCL 10 MG PO TABS: 10.0000 mg | ORAL_TABLET | Freq: Three times a day (TID) | ORAL | 1 refills | Status: DC

## 2016-11-18 MED FILL — hydrALAZINE HCL 10 MG TABS: 10 | 30 days supply | Qty: 90 | Fill #0

## 2016-11-18 MED FILL — FLUTICASONE PROP 50 MCG SPR: 50 | 30 days supply | Qty: 16 | Fill #0

## 2016-11-18 NOTE — Progress Notes (Addendum)
Robin [name], is a 61 y.o. female  [id number]  [id number]  DOB - September 26, 1956  Chief Complaint  Patient presents with  . Hypertension  . Referral    [place] specialist        Subjective:   Robin [name] is a 61 y.o. female here today for a follow up visit.  She still c/o of sob/doe w/ exertion. Recent echo wnl, but still has not had pfts done.  Hx of osa using cpap as well, w/ htn, morbid obesity, oa right knee pending tka if she can lose some more weight. C/o of le edema 2-3 days now as well, especially when standing for long periods. Of note, she does try to watch her salt some, but does cook w/ it.  Co of nasal congestion/dry cough last couple of weeks too. Denies f/c, granddgt had pna recently, and her husband has same sx. She is currently not taking Allegra (allergy med prescribed last Nov), but is using her flonase.  Patient has No headache, No chest pain, No abdominal pain - No Nausea, No new weakness tingling or numbness, No Cough.  No problems updated.  ALLERGIES: No Known Allergies  PAST MEDICAL HISTORY: Past Medical History:  Diagnosis Date  . Anxiety disorder   . Arthritis   . Chronic neck pain   . Constipation   . Diabetes mellitus without complication ([place])   . GERD (gastroesophageal reflux disease)   . Hiatal hernia    small  . Hyperlipidemia   . Hypertension   . Schatzki's ring    non critical  . Sleep apnea    CPAP, Sleep study at [place]: Prior to Admission medications   Medication Sig Start Date End Date Taking? Authorizing Provider  Albuterol Sulfate (PROAIR RESPICLICK) 123XX123 (90 Base) MCG/ACT AEPB Inhale 2 puffs into the lungs as needed. 08/05/16   Maren Reamer, MD  amitriptyline (ELAVIL) 50 MG tablet Take 1 tablet (50 mg total) by mouth at bedtime. 09/22/16   Maren Reamer, MD  amLODipine (NORVASC) 10 MG tablet Take 1 tablet (10 mg total) by mouth daily. 09/22/16   Maren Reamer, MD  aspirin  81 MG tablet Take 1 tablet (81 mg total) by mouth at bedtime. Reported on 12/09/2015 09/22/16   Maren Reamer, MD  azithromycin (ZITHROMAX) 250 MG tablet Take 1 tablet (250 mg total) by mouth daily. Take first 2 tablets together, then 1 every day until finished. Patient not taking: Reported on 11/18/2016 10/06/16   Lysbeth Penner, FNP  benzonatate (TESSALON) 100 MG capsule Take 2 capsules (200 mg total) by mouth 3 (three) times daily as needed for cough. Patient not taking: Reported on 11/18/2016 10/06/16   Lysbeth Penner, FNP  budesonide (RHINOCORT AQUA) 32 MCG/ACT nasal spray Place 2 sprays into both nostrils daily. 12/09/15   Maren Reamer, MD  buPROPion (WELLBUTRIN XL) 300 MG 24 hr tablet Take 1 tablet (300 mg total) by mouth every morning. Reported on 12/09/2015 09/22/16   Maren Reamer, MD  citalopram (CELEXA) 20 MG tablet Take 1 tablet (20 mg total) by mouth daily. 08/05/16   Maren Reamer, MD  dexlansoprazole (DEXILANT) 60 MG capsule Take 1 capsule (60 mg total) by mouth daily. 12/20/15   Argentina Donovan, PA-C  diclofenac sodium (VOLTAREN) 1 % GEL Apply 2 g topically 4 (four) times daily. 05/12/16   Maren Reamer, MD  ferrous sulfate 325 (65 FE) MG  tablet TAKE 1 TABLET BY MOUTH 2 TIMES DAILY WITH A MEAL. 07/06/16   Maren Reamer, MD  fexofenadine (ALLEGRA) 180 MG tablet Take 1 tablet (180 mg total) by mouth daily. Reported on 12/09/2015 11/18/16   Maren Reamer, MD  fluticasone (FLONASE) 50 MCG/ACT nasal spray Place 2 sprays into both nostrils daily. 11/18/16   Maren Reamer, MD  gabapentin (NEURONTIN) 300 MG capsule Take 3 capsules (900 mg total) by mouth 3 (three) times daily. Needs office visit for refills 09/22/16   Maren Reamer, MD  guaiFENesin-codeine [facility]) 100-10 MG/5ML syrup Take 10 mLs by mouth 4 (four) times daily as needed for cough. Patient not taking: Reported on 11/18/2016 09/30/16   Billy Fischer, MD  hydrALAZINE (APRESOLINE) 10 MG tablet Take 1 tablet (10  mg total) by mouth 3 (three) times daily. 11/18/16   Maren Reamer, MD  ipratropium (ATROVENT) 0.06 % nasal spray Place 2 sprays into both nostrils 4 (four) times daily. 09/30/16   Billy Fischer, MD  levothyroxine (SYNTHROID, LEVOTHROID) 88 MCG tablet Take 1 tablet (88 mcg total) by mouth daily. 09/22/16   Maren Reamer, MD  loratadine (CLARITIN) 10 MG tablet Take 1 tablet (10 mg total) by mouth daily. 12/09/15   Maren Reamer, MD  losartan-hydrochlorothiazide (HYZAAR) 100-25 MG tablet Take 1 tablet by mouth daily. Reported on 12/09/2015 09/22/16   Maren Reamer, MD  lubiprostone (AMITIZA) 24 MCG capsule Take 1 capsule (24 mcg total) by mouth 2 (two) times daily as needed for constipation. 03/04/16   Maren Reamer, MD  metFORMIN (GLUCOPHAGE) 500 MG tablet Take 0.5 tablets (250 mg total) by mouth daily with breakfast. 08/05/16   Maren Reamer, MD  methocarbamol (ROBAXIN) 500 MG tablet Take 1 tablet (500 mg total) by mouth 4 (four) times daily. 09/22/16   Maren Reamer, MD  methylPREDNISolone (MEDROL DOSEPAK) 4 MG TBPK tablet Take 6-5-4-3-2-1 po qd Patient not taking: Reported on 11/18/2016 10/06/16   Lysbeth Penner, FNP  Multiple Vitamin (MULTIVITAMIN) tablet Take 1 tablet by mouth daily. Reported on 12/09/2015 12/09/15   Maren Reamer, MD  naproxen (NAPROSYN) 500 MG tablet Take 1 tablet (500 mg total) by mouth 2 (two) times daily with a meal. 09/22/16   Maren Reamer, MD  Omega-3 Fatty Acids (FISH OIL PO) Take 1 capsule by mouth at bedtime. Reported on 12/09/2015    Historical Provider, MD  polyethylene glycol powder (GLYCOLAX/MIRALAX) powder Take 17 g by mouth 2 (two) times daily as needed. 03/05/16   Maren Reamer, MD  predniSONE (DELTASONE) 20 MG tablet Take 3 tablets (60 mg total) by mouth daily with breakfast. Day 1-3, take 60mg  (=3 tabs) qam, day 4-7 take 40mg  qam (=2 tabs), day 8-10 take 1 tab daily, than stop 09/22/16   Maren Reamer, MD  traMADol (ULTRAM) 50 MG tablet TAKE 1  TABLET BY MOUTH EVERY 6 HOURS AS NEEDED. 11/02/16   Maren Reamer, MD     Objective:   Vitals:   11/18/16 0918  BP: (!) 152/88  Pulse: 86  Resp: 16  Temp: 97.9 F (36.6 C)  TempSrc: Oral  SpO2: 97%  Weight: 298 lb 9.6 oz (135.4 kg)    Exam General appearance : Awake, alert, not in any distress. Speech Clear. Not toxic looking, morbid obese. HEENT: Atraumatic and Normocephalic, pupils equally reactive to light. bilat TMs clear. O/p clear. bilat nares clear, nttp frontal/max sinuses. Neck: supple, no JVD.  No cervical lymphadenopathy.  Chest:Good air entry bilaterally, no added sounds. CVS: S1 S2 regular, no murmurs/gallups or rubs. Abdomen: Bowel sounds active, obese,  Non tender and not distended with no gaurding, rigidity or rebound. Extremities: B/L Lower Ext shows trace edema, neg Homan's sign, both legs are warm to touch Neurology: Awake alert, and oriented X 3, CN II-XII grossly intact, Non focal Skin:No Rash  Data Review Lab Results  Component Value Date   HGBA1C 5.4 08/05/2016   HGBA1C 5.2 12/09/2015   HGBA1C 5.6 03/04/2015    Depression screen PHQ 2/9 11/18/2016 10/19/2016 09/22/2016 08/05/2016 04/01/2016  Decreased Interest 2 (No Data) 1 2 1   Down, Depressed, Hopeless 2 (No Data) 2 2 3   PHQ - 2 Score 4 - 3 4 4   Altered sleeping 2 (No Data) 1 2 1   Tired, decreased energy 3 (No Data) 3 3 3   Change in appetite 3 (No Data) 3 3 3   Feeling bad or failure about yourself  0 (No Data) 0 (No Data) 0  Trouble concentrating 2 (No Data) 1 2 1   Moving slowly or fidgety/restless 2 (No Data) 3 2 3   Suicidal thoughts 0 (No Data) 0 0 0  PHQ-9 Score 16 - 14 16 15   Difficult doing work/chores - - - - -   Echo 09/29/16 Impressions:  - LVEF 55-60%, moderate LVH, normal wall motion, indeterminate   diastolic function, normal LA size, normal IVC.  pfts - ordered 10/19/16 - still pending//  Assessment & Plan   1. HTN (hypertension), benign, w/ noted lower extremity  edema Uncontrolled, low salt diet again advised - continue norvasc 10 qd, continue  hyzaar 100-25 qd, added hydralazine 10tid. - fu RN Carilyn Goodpasture for bp check 2 wks, if sbp >130s, than increase hydralazine to 25tid - rx for compression stockings provided today for pedal edema  2. SOB (shortness of breath) on exertion Persistent, multifactorial, +osa/morbid obesity/deconditioning. - Ambulatory referral to Pulmonology - Pulmonary function test; Future  - still waiting for pfts, ordered 10/19/16, reordered again today - echo 12/17 unremarkable.  3. Morbid obesity ([place]) Lengthy discussion w/ pt. Would not recd diet pills due to s/es and only shortterm benefits. - recd low carb diet/hf/if diet - info provided again. - recd low impact exercise such as walking and swimming.  4. OSA (obstructive sleep apnea) Encouraged to use cpap  5. Hyperglycemia, w/ prediabetes range now. - continue metformin 250mg  qday. - POCT glucose (manual entry)  6. Controlled diabetes mellitus type 2 with complications, unspecified long term insulin use status ([place]) - Ambulatory referral to Ophthalmology  7. 2- 3 weeks uri sx Supportive care recd, likely viral. Vs allergies - increase water intake - recd starting allegra for allergy component, flonase nasal spray daily as well.   Patient have been counseled extensively about nutrition and exercise  Return in about 3 months (around 02/15/2017), or if symptoms worsen or fail to improve.  The patient was given clear instructions to go to ER or return to medical center if symptoms don't improve, worsen or new problems develop. The patient verbalized understanding. The patient was told to call to get lab results if they haven't heard anything in the next week.   This note has been created with Surveyor, quantity. Any transcriptional errors are unintentional.   Maren Reamer, MD, [place] and  [facility], [place]   11/18/2016, 10:04 AM

## 2016-11-18 NOTE — Patient Instructions (Addendum)
RN Carilyn Goodpasture bp check 2 wks  QUICK START PATIENT GUIDE TO LCHF/IF LOW CARB HIGH FAT / INTERMITTENT FASTING  Recommend: <50 gram carbohydrate a day for weightloss.  What is this diet and how does it work? o Insulin is a hormone made by your body that allows you to use sugar (glucose) from carbohydrates in the food you eat for energy or to store glucose (as fat) for future use  o Insulin levels need to be lowered in order to utilize our stored energy (fat) o Many struggling with obesity are insulin resistant and have high levels of insulin o This diet works to lower your insulin in two ways o Fasting - allows your insulin levels to naturally decrease  o Avoiding carbohydrates - carbs trigger increase in insulin Low Carb Healthy Fat (LCHF) o Get a free app for your phone, such as MyFitnessPal, to help you track your macronutrients (carbs/protein/fats) and to track your weight and body measurements to see your progress o Set your goal for around 10% carbs/20% protein/70% fat o A good starting goal for amount of net carbs per day is 50 grams (some will aim for 20 grams) o "Net carbs" refers to total grams of carbs minus grams of fiber (as fiber is not typically absorbed). For example, if a food has 5g total carb and 3g fiber, that would be 2g net carbs o Increase healthy fats - eg. olive oil, eggs, nuts, avocado, cheese, butter, coconut, meats, fish o Avoid high carb foods - eg. bread, pasta, potatoes, rice, cookies, soda, juice, anything sugary o Buy full-fat ingredients (avoid low-fat versions, which often have more sugar) o No need to count calories, but pay close attention to grams of carbs on labels Intermittent Fasting (IF) o "Fasting" is going a period of time without eating - it helps to stay busy and well-hydrated o Purpose of fasting is to allow insulin levels to drop as low as possible, allowing your body to switch into fat-burning mode o With this diet there are many approaches to  fasting, but 16:8 and 24hr fasts are commonly used o 16:8 fast, usually 5-7 days a week - Fasting for 16 hours of the day, then eating all meals for the day over course of 8 hours. o 24 hour fast, usually 1-3 days a week - Typically eating one meal a day, then fasting until the next day. Plenty of fluids (and some salt to help you hold onto fluids) are recommended during longer fasts.  o During fasts certain beverages are still acceptable - water, sparkling water, bone broth, black tea or coffee, or tea/coffee with small amount of heavy whipping cream Special note for those on diabetic medications o Discuss your medications with your physician. You may need to hold your medication or adjust to only taking when eating. Diabetics should keep close track of their blood sugars when making any changes to diet/meds, to ensure they are staying within normal limits For more info about LCHF/IF o Watch "Therapeutic Fasting - Solving the Two-Compartment Problem" video by Dr. Sharman Cheek on YouTube (GreatestGyms.com.ee) for a great intro to these concepts o Read "The Obesity Code" and/or "The Complete Guide to Fasting" by Dr. Sharman Cheek o Go to www.dietdoctor.com for explanations, recipes, and infographics about foods to eat/avoid o Get a Free smartphone app that helps count carbohydrates  - ie MyKeto EXAMPLES TO GET STARTED Fasting Beverages -water (can add  tsp Pink Himalayan salt once or twice a day to help stay hydrated  for longer fasts) -Sparkling water (such as [employer] or similar; avoid any with artificial sweeteners)  -Bone broth (multiple recipes available online or can buy pre-made) -Tea or Coffee (Adding heavy whipping cream or coconut oil to your tea or coffee can be helpful if you find yourself getting too hungry during the fasts. Can also add cinnamon for flavor. Or "bulletproof coffee.") Low Carb Healthy Fat Breakfast (if not fasting) -eggs in butter or olive oil with  avocado -omelet with veggies and cheese  Lunch -hamburger with cheese and avocado wrapped in lettuce (no bun, no ketchup) -meat and cheese wrapped in lettuce (can dip in mustard or olive oil/vinegar/mayo) -salad with meat/cheese/nuts and higher fat dressing (vinaigrette or Ranch, etc) -tuna salad lettuce wrap -taco meat with cheese, sour cream, guacamole, cheese over lettuce  Dinner -steak with herb butter or Barnaise sauce -"Fathead" pizza (uses cheese and almond flour for the dough - several recipes available online) -roasted or grilled chicken with skin on, with low carb sauce (buffalo, garlic butter, alfredo, pesto, etc) -baked salmon with lemon butter -chicken alfredo with zucchini noodles -[place] butter chicken with low carb garlic naan -egg roll in a bowl  Side Dishes -mashed cauliflower (homemade or available in freezer section) -roast vegetables (green veggies that grow above ground rather than root veggies) with butter or cheese -Caprese salad (fresh mozzarella, tomato and basil with olive oil) -homemade low-carb coleslaw Snacks/Desserts (try to avoid unnecessary snacking and sweets in general) -celery or cucumber dipped in guacamole or sour cream dip -cheese and meat slices  -raspberries with whipped cream (can make homemade with no sugar added) -low carb [place] butter cake  AVOID - sugar, diet/regular soda, potatoes, breads, rice, pasta, candy, cookies, cakes, muffins, juice, high carb fruit (bananas, grapes), beer, ketchup, barbeque and other sweet sauces  -  Low-Sodium Eating Plan Sodium raises blood pressure and causes water to be held in the body. Getting less sodium from food will help lower your blood pressure, reduce any swelling, and protect your heart, liver, and kidneys. We get sodium by adding salt (sodium chloride) to food. Most of our sodium comes from canned, boxed, and frozen foods. Restaurant foods, fast foods, and pizza are also very high in sodium.  Even if you take medicine to lower your blood pressure or to reduce fluid in your body, getting less sodium from your food is important. What is my plan? Most people should limit their sodium intake to 2,300 mg a day. Your health care provider recommends that you limit your sodium intake to 000mg  a day. What do I need to know about this eating plan? For the low-sodium eating plan, you will follow these general guidelines:  Choose foods with a % Daily Value for sodium of less than 5% (as listed on the food label).  Use salt-free seasonings or herbs instead of table salt or sea salt.  Check with your health care provider or pharmacist before using salt substitutes.  Eat fresh foods.  Eat more vegetables and fruits.  Limit canned vegetables. If you do use them, rinse them well to decrease the sodium.  Limit cheese to 1 oz (28 g) per day.  Eat lower-sodium products, often labeled as "lower sodium" or "no salt added."  Avoid foods that contain monosodium glutamate (MSG). MSG is sometimes added to [place] food and some canned foods.  Check food labels (Nutrition Facts labels) on foods to learn how much sodium is in one serving.  Eat more home-cooked food and less  restaurant, buffet, and fast food.  When eating at a restaurant, ask that your food be prepared with less salt, or no salt if possible. How do I read food labels for sodium information? The Nutrition Facts label lists the amount of sodium in one serving of the food. If you eat more than one serving, you must multiply the listed amount of sodium by the number of servings. Food labels may also identify foods as:  Sodium free-Less than 5 mg in a serving.  Very low sodium-35 mg or less in a serving.  Low sodium-140 mg or less in a serving.  Light in sodium-50% less sodium in a serving. For example, if a food that usually has 300 mg of sodium is changed to become light in sodium, it will have 150 mg of sodium.  Reduced  sodium-25% less sodium in a serving. For example, if a food that usually has 400 mg of sodium is changed to reduced sodium, it will have 300 mg of sodium. What foods can I eat? Grains  Low-sodium cereals, including oats, puffed wheat and rice, and shredded wheat cereals. Low-sodium crackers. Unsalted rice and pasta. Lower-sodium bread. Vegetables  Frozen or fresh vegetables. Low-sodium or reduced-sodium canned vegetables. Low-sodium or reduced-sodium tomato sauce and paste. Low-sodium or reduced-sodium tomato and vegetable juices. Fruits  Fresh, frozen, and canned fruit. Fruit juice. Meat and Other Protein Products  Low-sodium canned tuna and salmon. Fresh or frozen meat, poultry, seafood, and fish. Lamb. Unsalted nuts. Dried beans, peas, and lentils without added salt. Unsalted canned beans. Homemade soups without salt. Eggs. Dairy  Milk. Soy milk. Ricotta cheese. Low-sodium or reduced-sodium cheeses. Yogurt. Condiments  Fresh and dried herbs and spices. Salt-free seasonings. Onion and garlic powders. Low-sodium varieties of mustard and ketchup. Fresh or refrigerated horseradish. Lemon juice. Fats and Oils  Reduced-sodium salad dressings. Unsalted butter. Other  Unsalted popcorn and pretzels. The items listed above may not be a complete list of recommended foods or beverages. Contact your dietitian for more options.  What foods are not recommended? Grains  Instant hot cereals. Bread stuffing, pancake, and biscuit mixes. Croutons. Seasoned rice or pasta mixes. Noodle soup cups. Boxed or frozen macaroni and cheese. Self-rising flour. Regular salted crackers. Vegetables  Regular canned vegetables. Regular canned tomato sauce and paste. Regular tomato and vegetable juices. Frozen vegetables in sauces. Salted [place] fries. Olives. Angie Fava. Relishes. Sauerkraut. Salsa. Meat and Other Protein Products  Salted, canned, smoked, spiced, or pickled meats, seafood, or fish. Bacon, ham, sausage, hot  dogs, corned beef, chipped beef, and packaged luncheon meats. Salt pork. Jerky. Pickled herring. Anchovies, regular canned tuna, and sardines. Salted nuts. Dairy  Processed cheese and cheese spreads. Cheese curds. Blue cheese and cottage cheese. Buttermilk. Condiments  Onion and garlic salt, seasoned salt, table salt, and sea salt. Canned and packaged gravies. Worcestershire sauce. Tartar sauce. Barbecue sauce. Teriyaki sauce. Soy sauce, including reduced sodium. Steak sauce. Fish sauce. Oyster sauce. Cocktail sauce. Horseradish that you find on the shelf. Regular ketchup and mustard. Meat flavorings and tenderizers. Bouillon cubes. Hot sauce. Tabasco sauce. Marinades. Taco seasonings. Relishes. Fats and Oils  Regular salad dressings. Salted butter. Margarine. Ghee. Bacon fat. Other  Potato and tortilla chips. Corn chips and puffs. Salted popcorn and pretzels. Canned or dried soups. Pizza. Frozen entrees and pot pies. The items listed above may not be a complete list of foods and beverages to avoid. Contact your dietitian for more information.  This information is not intended to replace advice given  to you by your health care provider. Make sure you discuss any questions you have with your health care provider. Document Released: 03/13/2002 Document Revised: 02/27/2016 Document Reviewed: 07/26/2013 Elsevier Interactive Patient Education  2017 [employer]  -   Hypertension Hypertension is another name for high blood pressure. High blood pressure forces your heart to work harder to pump blood. A blood pressure reading has two numbers, which includes a higher number over a lower number (example: 110/72). Follow these instructions at home:  Have your blood pressure rechecked by your doctor.  Only take medicine as told by your doctor. Follow the directions carefully. The medicine does not work as well if you skip doses. Skipping doses also puts you at risk for problems.  Do not  smoke.  Monitor your blood pressure at home as told by your doctor. Contact a doctor if:  You think you are having a reaction to the medicine you are taking.  You have repeat headaches or feel dizzy.  You have puffiness (swelling) in your ankles.  You have trouble with your vision. Get help right away if:  You get a very bad headache and are confused.  You feel weak, numb, or faint.  You get chest or belly (abdominal) pain.  You throw up (vomit).  You cannot breathe very well. This information is not intended to replace advice given to you by your health care provider. Make sure you discuss any questions you have with your health care provider. Document Released: 03/09/2008 Document Revised: 02/27/2016 Document Reviewed: 07/14/2013 Elsevier Interactive Patient Education  2017 [employer].

## 2016-11-26 MED FILL — metFORMIN HCL 500 MG TABS: 500 | 30 days supply | Qty: 30 | Fill #7

## 2016-11-26 MED FILL — GABAPENTIN 300 MG CAPSULE: 300 | 20 days supply | Qty: 180 | Fill #2

## 2016-11-26 MED FILL — ?BUPROPION HCL XL 300 MG TA: 300 | 30 days supply | Qty: 30 | Fill #3

## 2016-11-26 MED FILL — AMLODIPINE BESYLATE 10 MG T: 10 | 30 days supply | Qty: 30 | Fill #2

## 2016-12-02 MED FILL — METHOCARBAMOL 500 MG TABLET: 500 | 12 days supply | Qty: 45 | Fill #1

## 2016-12-02 MED FILL — LEVOTHYROXINE 88 MCG TABLET: 88 | 30 days supply | Qty: 30 | Fill #2

## 2016-12-03 ENCOUNTER — Ambulatory Visit: Payer: Medicaid Other | Attending: Internal Medicine | Admitting: *Deleted

## 2016-12-03 VITALS — BP 150/74 | HR 86 | Resp 16 | Wt 303.4 lb

## 2016-12-03 DIAGNOSIS — I10 Essential (primary) hypertension: Secondary | ICD-10-CM | POA: Diagnosis not present

## 2016-12-03 DIAGNOSIS — Z048 Encounter for examination and observation for other specified reasons: Secondary | ICD-10-CM | POA: Diagnosis present

## 2016-12-03 NOTE — Progress Notes (Signed)
Pt here for f/u BP check after office visit with Dr. Janne Napoleon. Pt denies chest pain, SOB, HA, new vison concerns. BLE edema noted, +1 edema. Right leg greater that left leg.  Has not been able to pick up the compression hose.  She states she has been taking medications as prescribed.Blood pressure taken manually while patient is sitting: 150/74 left arm  And 148/76 right arm. Pulse: 86   Patient aware of future appointments. Pt verbalized understanding.  Nurse visit will be routed to provider.Carilyn Goodpasture, RN, BSN

## 2016-12-07 ENCOUNTER — Telehealth: Payer: Self-pay | Admitting: Internal Medicine

## 2016-12-07 ENCOUNTER — Other Ambulatory Visit: Payer: Self-pay | Admitting: Internal Medicine

## 2016-12-07 MED ORDER — HYDRALAZINE HCL 25 MG PO TABS: 25.0000 mg | ORAL_TABLET | Freq: Three times a day (TID) | ORAL | 3 refills | Status: DC

## 2016-12-07 NOTE — Telephone Encounter (Signed)
Called pt, left vm bp noted on rn visit 12/03/16.  * Will increase hydralazine to 25 tid (from 10mg  tid), continue taking hyzaar daily and norvasc daily.  Susette Racer - could you call Ms Vondra and tell her of the blood pressure increase. thanks

## 2016-12-07 NOTE — Telephone Encounter (Signed)
Contacted pt and spoke with her regarding the increase in medication from her bp check on 12/03/16 pt states she understands and I informed pt that Dr. Janne Napoleon sent in a new rx to the pharmacy and she can pick that up. Pt states she understands and doesn't have any questions or concerns

## 2016-12-16 ENCOUNTER — Other Ambulatory Visit: Payer: Self-pay | Admitting: Internal Medicine

## 2016-12-16 MED FILL — LOSARTAN-HCTZ 100-25 MG TAB: 100-25 | 30 days supply | Qty: 30 | Fill #2

## 2016-12-18 ENCOUNTER — Other Ambulatory Visit: Payer: Self-pay | Admitting: *Deleted

## 2016-12-18 MED ORDER — HYDRALAZINE HCL 25 MG PO TABS: 25.0000 mg | ORAL_TABLET | Freq: Three times a day (TID) | ORAL | 3 refills | Status: DC

## 2016-12-18 MED FILL — hydrALAZINE HCL 25 MG TABS: 25 | 30 days supply | Qty: 90 | Fill #0

## 2016-12-18 MED FILL — traMADol HCL 50 MG TABS: 50 | 15 days supply | Qty: 60 | Fill #0

## 2016-12-18 NOTE — Telephone Encounter (Signed)
Pt arrived to office for Hydralazine medication at the pharmacy. Medication was not ready to pick up. Pt prefers medication to be filled here at [facility] instead of [place]. Pharmacy request was changed to preference.

## 2016-12-22 MED FILL — raNITIdine HCL 150 MG TABS: 150 | 30 days supply | Qty: 30 | Fill #5

## 2016-12-22 MED FILL — AMITRIPTYLINE HCL 50 MG TAB: 50 | 30 days supply | Qty: 30 | Fill #0

## 2016-12-23 ENCOUNTER — Other Ambulatory Visit (INDEPENDENT_AMBULATORY_CARE_PROVIDER_SITE_OTHER): Payer: Medicaid Other

## 2016-12-23 ENCOUNTER — Other Ambulatory Visit: Payer: Self-pay | Admitting: Pharmacist

## 2016-12-23 ENCOUNTER — Telehealth: Payer: Self-pay | Admitting: Pulmonary Disease

## 2016-12-23 ENCOUNTER — Ambulatory Visit (INDEPENDENT_AMBULATORY_CARE_PROVIDER_SITE_OTHER): Payer: Medicaid Other | Admitting: Pulmonary Disease

## 2016-12-23 ENCOUNTER — Encounter: Payer: Self-pay | Admitting: Pulmonary Disease

## 2016-12-23 VITALS — BP 142/86 | HR 82 | Ht 68.0 in | Wt 299.8 lb

## 2016-12-23 DIAGNOSIS — J309 Allergic rhinitis, unspecified: Secondary | ICD-10-CM | POA: Diagnosis not present

## 2016-12-23 DIAGNOSIS — R0609 Other forms of dyspnea: Secondary | ICD-10-CM

## 2016-12-23 DIAGNOSIS — K219 Gastro-esophageal reflux disease without esophagitis: Secondary | ICD-10-CM

## 2016-12-23 DIAGNOSIS — R042 Hemoptysis: Secondary | ICD-10-CM | POA: Diagnosis not present

## 2016-12-23 DIAGNOSIS — J3089 Other allergic rhinitis: Secondary | ICD-10-CM | POA: Insufficient documentation

## 2016-12-23 LAB — PROTIME-INR
INR: 1 ratio (ref 0.8–1.0)
PROTHROMBIN TIME: 10.5 s (ref 9.6–13.1)

## 2016-12-23 LAB — APTT: APTT: 24.1 s (ref 23.4–32.7)

## 2016-12-23 MED ORDER — LUBIPROSTONE 24 MCG PO CAPS: 24.0000 ug | ORAL_CAPSULE | Freq: Two times a day (BID) | ORAL | 0 refills | Status: DC | PRN

## 2016-12-23 MED ORDER — DEXLANSOPRAZOLE 60 MG PO CPDR: 60.0000 mg | DELAYED_RELEASE_CAPSULE | Freq: Every day | ORAL | 0 refills | Status: DC

## 2016-12-23 NOTE — Progress Notes (Signed)
Subjective:    Patient ID: Robin [name], female    DOB: 09-29-1956, 61 y.o.   MRN: [id number]  HPI She reports her dyspnea started about 3 months ago but on further recall she feels her dyspnea has been building for at least a year. She started noticing it more over the last 3 months. She reports dyspnea at rest as well but primarily when exerting herself. She admits she doesn't exert herself frequently. She reports she is only able to climb 4 steps before stopping to rest. She denies any associated chest tightness, pain, or pressure. She does have palpitations with her dyspnea. She reports intermittent cough and reports that she has noticed intermittent spells of hemoptysis that she describes as blood mixed in with mucus. She does also wheeze at times. She reports she has diffuse joint pain in her knees, shoulders, hips, & right foot. She reports stiffness in her hands in the morning and it takes along time to loosen them up. No joint swelling or erythema. She reports chronic allergic rhinitis with sinus congestion & drainage. She reports reflux at least 3 times weekly. She does have morning brash water taste at times. She reports intermittent nausea and vague abdominal pain. She reports she has been referred to GI but hasn't had an appointment yet. She was diagnosed with sleep apnea 4-5 years ago but hasn't used it recently because she needs new supplies.   Review of Systems No fever or chills. She does have intermittent sweats. No rashes. Reports she previously did have a bruise on her leg that was of unclear origin. A pertinent 14 point review of systems is negative except as per the history of presenting illness.  No Known Allergies  Current Outpatient Prescriptions on File Prior to Visit  Medication Sig Dispense Refill  . Albuterol Sulfate (PROAIR RESPICLICK) 767 (90 Base) MCG/ACT AEPB Inhale 2 puffs into the lungs as needed. 1 each 1  . amitriptyline (ELAVIL) 50 MG tablet Take 1 tablet (50  mg total) by mouth at bedtime. 60 tablet 3  . amLODipine (NORVASC) 10 MG tablet Take 1 tablet (10 mg total) by mouth daily. 90 tablet 3  . aspirin 81 MG tablet Take 1 tablet (81 mg total) by mouth at bedtime. Reported on 12/09/2015 90 tablet 3  . benzonatate (TESSALON) 100 MG capsule Take 2 capsules (200 mg total) by mouth 3 (three) times daily as needed for cough. 21 capsule 0  . budesonide (RHINOCORT AQUA) 32 MCG/ACT nasal spray Place 2 sprays into both nostrils daily. 1 Bottle 5  . buPROPion (WELLBUTRIN XL) 300 MG 24 hr tablet Take 1 tablet (300 mg total) by mouth every morning. Reported on 12/09/2015 90 tablet 3  . citalopram (CELEXA) 20 MG tablet Take 1 tablet (20 mg total) by mouth daily. 60 tablet 2  . dexlansoprazole (DEXILANT) 60 MG capsule Take 1 capsule (60 mg total) by mouth daily. 90 capsule 3  . diclofenac sodium (VOLTAREN) 1 % GEL Apply 2 g topically 4 (four) times daily. 100 g 2  . ferrous sulfate 325 (65 FE) MG tablet TAKE 1 TABLET BY MOUTH 2 TIMES DAILY WITH A MEAL. 60 tablet 0  . fexofenadine (ALLEGRA) 180 MG tablet Take 1 tablet (180 mg total) by mouth daily. Reported on 12/09/2015 30 tablet 3  . fluticasone (FLONASE) 50 MCG/ACT nasal spray Place 2 sprays into both nostrils daily. 16 g 6  . gabapentin (NEURONTIN) 300 MG capsule Take 3 capsules (900 mg total) by mouth 3 (  three) times daily. Needs office visit for refills 180 capsule 2  . guaiFENesin-codeine (ROBITUSSIN AC) 100-10 MG/5ML syrup Take 10 mLs by mouth 4 (four) times daily as needed for cough. 180 mL 0  . hydrALAZINE (APRESOLINE) 25 MG tablet Take 1 tablet (25 mg total) by mouth 3 (three) times daily. 90 tablet 3  . ipratropium (ATROVENT) 0.06 % nasal spray Place 2 sprays into both nostrils 4 (four) times daily. 15 mL 1  . levothyroxine (SYNTHROID, LEVOTHROID) 88 MCG tablet Take 1 tablet (88 mcg total) by mouth daily. 90 tablet 2  . loratadine (CLARITIN) 10 MG tablet Take 1 tablet (10 mg total) by mouth daily. 30 tablet 11    . losartan-hydrochlorothiazide (HYZAAR) 100-25 MG tablet Take 1 tablet by mouth daily. Reported on 12/09/2015 90 tablet 2  . lubiprostone (AMITIZA) 24 MCG capsule Take 1 capsule (24 mcg total) by mouth 2 (two) times daily as needed for constipation. 180 capsule 3  . metFORMIN (GLUCOPHAGE) 500 MG tablet Take 0.5 tablets (250 mg total) by mouth daily with breakfast. 45 tablet 3  . methocarbamol (ROBAXIN) 500 MG tablet Take 1 tablet (500 mg total) by mouth 4 (four) times daily. 45 tablet 1  . Multiple Vitamin (MULTIVITAMIN) tablet Take 1 tablet by mouth daily. Reported on 12/09/2015 90 tablet 1  . naproxen (NAPROSYN) 500 MG tablet Take 1 tablet (500 mg total) by mouth 2 (two) times daily with a meal. 30 tablet 0  . Omega-3 Fatty Acids (FISH OIL PO) Take 1 capsule by mouth at bedtime. Reported on 12/09/2015    . polyethylene glycol powder (GLYCOLAX/MIRALAX) powder Take 17 g by mouth 2 (two) times daily as needed. 3350 g 1  . traMADol (ULTRAM) 50 MG tablet TAKE 1 TABLET BY MOUTH EVERY 6 HOURS AS NEEDED 60 tablet 0  . azithromycin (ZITHROMAX) 250 MG tablet Take 1 tablet (250 mg total) by mouth daily. Take first 2 tablets together, then 1 every day until finished. (Patient not taking: Reported on 11/18/2016) 6 tablet 0  . methylPREDNISolone (MEDROL DOSEPAK) 4 MG TBPK tablet Take 6-5-4-3-2-1 po qd (Patient not taking: Reported on 11/18/2016) 21 tablet 0  . predniSONE (DELTASONE) 20 MG tablet Take 3 tablets (60 mg total) by mouth daily with breakfast. Day 1-3, take 60mg  (=3 tabs) qam, day 4-7 take 40mg  qam (=2 tabs), day 8-10 take 1 tab daily, than stop (Patient not taking: Reported on 12/23/2016) 20 tablet 0   No current facility-administered medications on file prior to visit.     Past Medical History:  Diagnosis Date  . Anxiety disorder   . Arthritis   . Chronic neck pain   . Constipation   . Diabetes mellitus without complication ([place])   . GERD (gastroesophageal reflux disease)   . Hiatal hernia     small  . Hyperlipidemia   . Hypertension   . Schatzki's ring    non critical  . Sleep apnea    CPAP, Sleep study at [place]    Past Surgical History:  Procedure Laterality Date  . ABDOMINAL HYSTERECTOMY    . ABDOMINAL HYSTERECTOMY  02/18/2000  . CARDIAC CATHETERIZATION  08/17/2001   normal L main/LAD/L Cfx/RCA (Dr. Adora Fridge)  . COLONOSCOPY  2006   Dr. [name], hyperplastic polyps  . COLONOSCOPY  08/06/11   abnormal terminal ileum for 10cm, erosions, geographical ulceration. Bx small bowel mucosa with prominent intramucosal lymphoid aggregates, slightly inflammed  . DIRECT LARYNGOSCOPY  03/15/2012   Procedure: DIRECT  LARYNGOSCOPY;  Surgeon: [name], MD;  Location: [facility];  Service: ENT;  Laterality: N/A; Dr. Wolicki--:>no foreign body seen. normal esophagus to 40cm  . ESOPHAGEAL DILATION  04/17/2015   Procedure: ESOPHAGEAL DILATION;  Surgeon: [name], MD;  [facility];  Service: Endoscopy;;  . ESOPHAGOGASTRODUODENOSCOPY  08/23/2008   BWG:YKZL distal esophageal erosions consistent with mild erosive reflux esophagitis, otherwise unremarkable esophagus/ Tiny antral erosions of doubtful clinical significance, otherwise normal stomach, patent pylorus, normal D1 and D2  . ESOPHAGOGASTRODUODENOSCOPY  08/06/11   small hh, noncritical Schatzki's ring s/p 57 F  . ESOPHAGOGASTRODUODENOSCOPY N/A 04/17/2015   DJT:TSVXBL s/p dilation  . ESOPHAGOSCOPY  03/15/2012   Procedure: ESOPHAGOSCOPY;  Surgeon: [name], MD;  Location: [facility];  Service: ENT;  Laterality: N/A;  . KNEE ARTHROSCOPY  04/27/2001  . NM MYOCAR PERF WALL MOTION  2003   negative bruce protocol exercise tress test; EF 68%; intermediate risk study due to evidence of anterior wall ischemia extending from mid-ventricle to apex  . 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 RECTOCELE REPAIR    . TRANSTHORACIC ECHOCARDIOGRAM  2003   EF normal  . VAGINAL PROLAPSE REPAIR       Family History  Problem Relation Age of Onset  . Kidney cancer Father   . Hypertension Father   . Hyperlipidemia Father   . Diabetes Mother   . Hypertension Mother   . Heart Problems Mother   . Hyperlipidemia Mother   . Asthma Mother   . Liver disease Brother   . Arthritis Brother   . Hypertension Sister   . Allergic rhinitis Sister   . Stomach cancer      aunt  . Breast cancer Maternal Grandmother   . Kidney disease Maternal Grandfather   . Breast cancer Paternal Grandmother   . Hypertension Brother   . Hyperlipidemia Brother   . Hypertension Brother   . Hypertension Sister   . Hyperlipidemia Sister   . Lupus Maternal Aunt   . Colon cancer Neg Hx   . Eczema Neg Hx   . Immunodeficiency Neg Hx   . Urticaria Neg Hx     Social History   Social History  . Marital status: Married    Spouse name: N/A  . Number of children: 4  . Years of education: 10   Occupational History  .  Unemployed   Social History Main Topics  . Smoking status: Former Smoker    Packs/day: 0.50    Years: 10.00    Types: Cigarettes    Start date: 07/14/1975    Quit date: 04/04/1993  . Smokeless tobacco: Never Used     Comment: 17 yrs ago  . Alcohol use No  . Drug use: No  . Sexual activity: Not Asked   Other Topics Concern  . None   Social History Narrative   Lives with husband in an apartment on the first floor.  Has 4 children.     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  Trying to get disability.  Formerly worked as a Teacher, early years/pre.  Education: 11th grade.      [facility] (12/23/16):   Originally from [facility]. She was raised in [place]. 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 No pets currently.  No bird exposure. She does have mold in her current home in the bathroom, laundry room, & master bedroom.       Objective:    Physical Exam BP (!) 142/86 (BP Location: Right Arm, Patient Position: Sitting, Cuff Size: Large)   Pulse 82   Ht 5\' 8"  (1.727 m)   Wt 299 lb 12.8 oz (136 kg)   SpO2 98%   BMI 45.58 kg/m  General:  Awake. Alert. No acute distress. Obese.  Integument:  Warm & dry. No rash on exposed skin. No bruising on exposed skin. Extremities:  No cyanosis or clubbing.  Lymphatics:  No appreciated cervical or supraclavicular lymphadenoapthy. HEENT:  Moist mucus membranes. No oral ulcers. No scleral injection or icterus. Mild bilateral nasal turbinate swelling. Cardiovascular:  Regular rate and rhythm. No edema. Normal S1 & S2.  Pulmonary:  Good aeration & clear to auscultation bilaterally. Symmetric chest wall expansion. No accessory muscle use. Abdomen: Soft. Normal bowel sounds. Protuberant. Musculoskeletal:  Normal bulk and tone. Hand grip strength 5/5 bilaterally. No joint deformity or effusion appreciated. Mild synovial thickening of bilateral DIP and PIP joints. Neurological:  CN 2-12 grossly in tact. No meningismus. Moving all 4 extremities equally. Symmetric brachioradialis deep tendon reflexes. Psychiatric:  Mood and affect congruent. Speech normal rhythm, rate & tone.   PFT 08/22/15: FVC 2.32 L (119%) FEV1 1.99 L (121%) FEV1/FVC 0.85 FEF 25-75 2.59 L (118%)  IMAGING CTA CHEST 12/13/15 (personally reviewed by me): No pulmonary emboli. No pleural effusion or thickening. No pericardial effusion. No pathologic mediastinal adenopathy. No parenchymal nodule, mass, or opacification.  CARDIAC TTE (09/29/16): LV normal in size with moderate concentric hypertrophy. EF 55-60% with no regional wall motion abnormalities. Indeterminant diastolic function. LA & RA normal in size. RV normal in size and function. No aortic stenosis or regurgitation. Aortic root normal in size. No mitral stenosis or regurgitation. No significant pulmonic regurgitation. No significant tricuspid regurgitation. No pericardial  effusion.  LEFT HEART CATHETERIZATION (11/13/3):  Aortic systolic pressure 081  Diastolic pressure 90  Left ventricular systolic pressure 448  End-diastolic pressure 27 left main coronary artery normal  Left anterior descending artery normal  Left circumflex coronary artery normal  Right coronary artery dominant and normal   Overall estimated ejection fraction greater than 60% without focal wall motion abnormality  LABS 05/12/16 ANA: Negative Rheumatoid factor:  <10    Assessment & Plan:  61 y.o. female with a long-standing history of progressive dyspnea occurring primarily with exertion. Patient also endorses dyspnea at rest today but as I was sitting in the room with her she did not appear dyspneic despite this complaint. Reviewing her previous chest CT imaging from a year ago there appeared to be no parenchymal abnormality. Her limited spirometry from 2016 does not indicate any obstruction or restriction but this may have preceded her symptom onset. Her recent echocardiogram does suggest the possibility of diastolic dysfunction with her moderate concentric hypertrophy this was not obvious with any left atrial dilatation. Certainly there could be some element of physical deconditioning contributing to the patient's symptoms. However, given her multiple medical problems and parenchymal process is certainly possible. I instructed the patient to contact my office if she had any new breathing problems or questions before her next appointment.  1. Dyspnea on exertion: Checking 6 minute walk test on room air and full pulmonary function testing before next appointment. 2. Hemoptysis: Checking serum coags & CT chest without contrast. 3. Chronic allergic rhinitis: Patient continuing on Rhinocort/Flonase, Atrovent  nasal spray, & Allegra/Claritin. 4. GERD: With patient's history of esophageal dysphagia and Schatzki's ring she has been referred to GI. Continuing on Dexilant. 5. Arthritis:  Checking serum ANA with comprehensive panel and anti-CCP. 6. Follow-up: Patient to return to clinic in 6 weeks.  Sonia Baller [name], M.D. [facility] Pager:  [phone] After 3pm or if no response, call [phone] 9:43 AM 12/23/16

## 2016-12-23 NOTE — Patient Instructions (Addendum)
   Call me if you start coughing up more blood.  Let me know if you have any new breathing problems.  We will review your test results when I see you back.  TESTS ORDERED: 1. Serum ANA with Comprehensive Panel, Anti-CCP, INR, & PTT. 2. Full PFTs before next appointment  3. CT Chest W/O before next appointment  4. 6MWT on room air before next appointment

## 2016-12-23 NOTE — Telephone Encounter (Signed)
I have noted this in patient's chart and thanked patient for letting us know.

## 2016-12-24 LAB — ANA COMPREHENSIVE PANEL
Centromere Ab Screen: <0.2 AI (ref 0.0–0.9)
ENA RNP Ab: <0.2 AI (ref 0.0–0.9)
ENA SSA (RO) Ab: <0.2 AI (ref 0.0–0.9)

## 2016-12-24 LAB — CYCLIC CITRUL PEPTIDE ANTIBODY, IGG: Cyclic Citrullin Peptide Ab: <16 Units

## 2016-12-25 MED FILL — metFORMIN HCL 500 MG TABS: 500 | 30 days supply | Qty: 15 | Fill #0

## 2016-12-25 MED FILL — AMLODIPINE BESYLATE 10 MG T: 10 | 30 days supply | Qty: 30 | Fill #3

## 2016-12-29 ENCOUNTER — Ambulatory Visit: Payer: Medicaid Other | Attending: Internal Medicine | Admitting: Internal Medicine

## 2016-12-29 ENCOUNTER — Encounter: Payer: Self-pay | Admitting: Internal Medicine

## 2016-12-29 VITALS — BP 145/82 | HR 84 | Temp 97.8°F | Resp 16 | Wt 298.0 lb

## 2016-12-29 DIAGNOSIS — E119 Type 2 diabetes mellitus without complications: Secondary | ICD-10-CM | POA: Insufficient documentation

## 2016-12-29 DIAGNOSIS — K2100 Gastro-esophageal reflux disease with esophagitis, without bleeding: Secondary | ICD-10-CM

## 2016-12-29 DIAGNOSIS — K8020 Calculus of gallbladder without cholecystitis without obstruction: Secondary | ICD-10-CM | POA: Diagnosis not present

## 2016-12-29 DIAGNOSIS — M542 Cervicalgia: Secondary | ICD-10-CM | POA: Diagnosis not present

## 2016-12-29 DIAGNOSIS — G8929 Other chronic pain: Secondary | ICD-10-CM | POA: Diagnosis not present

## 2016-12-29 DIAGNOSIS — Z7982 Long term (current) use of aspirin: Secondary | ICD-10-CM | POA: Insufficient documentation

## 2016-12-29 DIAGNOSIS — Z1211 Encounter for screening for malignant neoplasm of colon: Secondary | ICD-10-CM

## 2016-12-29 DIAGNOSIS — E785 Hyperlipidemia, unspecified: Secondary | ICD-10-CM | POA: Insufficient documentation

## 2016-12-29 DIAGNOSIS — F419 Anxiety disorder, unspecified: Secondary | ICD-10-CM | POA: Diagnosis not present

## 2016-12-29 DIAGNOSIS — R109 Unspecified abdominal pain: Secondary | ICD-10-CM | POA: Diagnosis present

## 2016-12-29 DIAGNOSIS — Z7984 Long term (current) use of oral hypoglycemic drugs: Secondary | ICD-10-CM | POA: Insufficient documentation

## 2016-12-29 DIAGNOSIS — K449 Diaphragmatic hernia without obstruction or gangrene: Secondary | ICD-10-CM | POA: Insufficient documentation

## 2016-12-29 DIAGNOSIS — G4730 Sleep apnea, unspecified: Secondary | ICD-10-CM | POA: Diagnosis not present

## 2016-12-29 DIAGNOSIS — R1084 Generalized abdominal pain: Secondary | ICD-10-CM | POA: Diagnosis not present

## 2016-12-29 DIAGNOSIS — I10 Essential (primary) hypertension: Secondary | ICD-10-CM | POA: Insufficient documentation

## 2016-12-29 DIAGNOSIS — K222 Esophageal obstruction: Secondary | ICD-10-CM | POA: Diagnosis not present

## 2016-12-29 DIAGNOSIS — K5900 Constipation, unspecified: Secondary | ICD-10-CM | POA: Insufficient documentation

## 2016-12-29 DIAGNOSIS — R7303 Prediabetes: Secondary | ICD-10-CM | POA: Diagnosis not present

## 2016-12-29 LAB — GLUCOSE, POCT (MANUAL RESULT ENTRY): POC Glucose: 105 mg/dl — AB (ref 70–99)

## 2016-12-29 MED ORDER — DEXLANSOPRAZOLE 60 MG PO CPDR: 60.0000 mg | DELAYED_RELEASE_CAPSULE | Freq: Every day | ORAL | 2 refills | Status: DC

## 2016-12-29 NOTE — Addendum Note (Signed)
Addended byLottie Mussel T on: 12/29/2016 01:11 PM   Modules accepted: Orders

## 2016-12-29 NOTE — Progress Notes (Addendum)
Robin [name], is a 61 y.o. female  XNT:[id number]  HQP:[id number]  DOB - 01/30/1956  Chief Complaint  Patient presents with  . Abdominal Pain        Subjective:   Robin [name] is a 61 y.o. female here today for  abd pain. I last saw her on  11/18/16 for sob/doe, she recently saw pulm Dr [name] 12/23/16 and wkup is ongoing for this. Of note, pt has long hx of gerd, on dexilant 60mg  qdaily - rx to her by her GI MD in [place] years ago. We have talked about trx to other medications due to ckd risk, but she has been unable to tol other changes. Of note, she recently ran out of her ppi x 3 wks now, had not picked up refill that was sent 12/23/16 in system, and now is starting to have more diffuse abd pains, described as dull ache. Early satiety.  Intermittent diarrhea w/ constipation. c/of significant indigestion, nausea and bad aftertaste in her throat as well more often now.  Of note, we had sent referral to gi for pt to have c-ssope screening 08/05/16 - but pt has been putting that off. No weight loss recently, denies hematochezia/hematemesis/brbpr/melana.   Patient has No headache, No chest pain, No abdominal pain - No Nausea, No new weakness tingling or numbness, No Cough - SOB.  No problems updated.  ALLERGIES: No Known Allergies  PAST MEDICAL HISTORY: Past Medical History:  Diagnosis Date  . Anxiety disorder   . Arthritis   . Chronic neck pain   . Constipation   . Diabetes mellitus without complication ([place])   . GERD (gastroesophageal reflux disease)   . Hiatal hernia    small  . Hyperlipidemia   . Hypertension   . Schatzki's ring    non critical  . Sleep apnea    CPAP, Sleep study at [place]: Prior to Admission medications   Medication Sig Start Date End Date Taking? Authorizing Provider  Albuterol Sulfate (PROAIR RESPICLICK) 599 (90 Base) MCG/ACT AEPB Inhale 2 puffs into the lungs as needed. 08/05/16   Maren Reamer, MD    amitriptyline (ELAVIL) 50 MG tablet Take 1 tablet (50 mg total) by mouth at bedtime. 09/22/16   Maren Reamer, MD  amLODipine (NORVASC) 10 MG tablet Take 1 tablet (10 mg total) by mouth daily. 09/22/16   Maren Reamer, MD  aspirin 81 MG tablet Take 1 tablet (81 mg total) by mouth at bedtime. Reported on 12/09/2015 09/22/16   Maren Reamer, MD  azithromycin (ZITHROMAX) 250 MG tablet Take 1 tablet (250 mg total) by mouth daily. Take first 2 tablets together, then 1 every day until finished. Patient not taking: Reported on 11/18/2016 10/06/16   Lysbeth Penner, FNP  benzonatate (TESSALON) 100 MG capsule Take 2 capsules (200 mg total) by mouth 3 (three) times daily as needed for cough. 10/06/16   Lysbeth Penner, FNP  budesonide (RHINOCORT AQUA) 32 MCG/ACT nasal spray Place 2 sprays into both nostrils daily. 12/09/15   Maren Reamer, MD  buPROPion (WELLBUTRIN XL) 300 MG 24 hr tablet Take 1 tablet (300 mg total) by mouth every morning. Reported on 12/09/2015 09/22/16   Maren Reamer, MD  citalopram (CELEXA) 20 MG tablet Take 1 tablet (20 mg total) by mouth daily. 08/05/16   Maren Reamer, MD  dexlansoprazole (DEXILANT) 60 MG capsule Take 1 capsule (60 mg total) by mouth daily.  12/29/16   Maren Reamer, MD  diclofenac sodium (VOLTAREN) 1 % GEL Apply 2 g topically 4 (four) times daily. 05/12/16   Maren Reamer, MD  ferrous sulfate 325 (65 FE) MG tablet TAKE 1 TABLET BY MOUTH 2 TIMES DAILY WITH A MEAL. 07/06/16   Maren Reamer, MD  fexofenadine (ALLEGRA) 180 MG tablet Take 1 tablet (180 mg total) by mouth daily. Reported on 12/09/2015 11/18/16   Maren Reamer, MD  fluticasone (FLONASE) 50 MCG/ACT nasal spray Place 2 sprays into both nostrils daily. 11/18/16   Maren Reamer, MD  gabapentin (NEURONTIN) 300 MG capsule Take 3 capsules (900 mg total) by mouth 3 (three) times daily. Needs office visit for refills 09/22/16   Maren Reamer, MD  guaiFENesin-codeine [facility]) 100-10 MG/5ML  syrup Take 10 mLs by mouth 4 (four) times daily as needed for cough. 09/30/16   Billy Fischer, MD  hydrALAZINE (APRESOLINE) 25 MG tablet Take 1 tablet (25 mg total) by mouth 3 (three) times daily. 12/18/16   Maren Reamer, MD  ipratropium (ATROVENT) 0.06 % nasal spray Place 2 sprays into both nostrils 4 (four) times daily. 09/30/16   Billy Fischer, MD  levothyroxine (SYNTHROID, LEVOTHROID) 88 MCG tablet Take 1 tablet (88 mcg total) by mouth daily. 09/22/16   Maren Reamer, MD  loratadine (CLARITIN) 10 MG tablet Take 1 tablet (10 mg total) by mouth daily. 12/09/15   Maren Reamer, MD  losartan-hydrochlorothiazide (HYZAAR) 100-25 MG tablet Take 1 tablet by mouth daily. Reported on 12/09/2015 09/22/16   Maren Reamer, MD  lubiprostone (AMITIZA) 24 MCG capsule Take 1 capsule (24 mcg total) by mouth 2 (two) times daily as needed for constipation. 12/23/16   Maren Reamer, MD  metFORMIN (GLUCOPHAGE) 500 MG tablet Take 0.5 tablets (250 mg total) by mouth daily with breakfast. 08/05/16   Maren Reamer, MD  methocarbamol (ROBAXIN) 500 MG tablet Take 1 tablet (500 mg total) by mouth 4 (four) times daily. 09/22/16   Maren Reamer, MD  methylPREDNISolone (MEDROL DOSEPAK) 4 MG TBPK tablet Take 6-5-4-3-2-1 po qd Patient not taking: Reported on 11/18/2016 10/06/16   Lysbeth Penner, FNP  Multiple Vitamin (MULTIVITAMIN) tablet Take 1 tablet by mouth daily. Reported on 12/09/2015 12/09/15   Maren Reamer, MD  naproxen (NAPROSYN) 500 MG tablet Take 1 tablet (500 mg total) by mouth 2 (two) times daily with a meal. 09/22/16   Maren Reamer, MD  Omega-3 Fatty Acids (FISH OIL PO) Take 1 capsule by mouth at bedtime. Reported on 12/09/2015    Historical Provider, MD  polyethylene glycol powder (GLYCOLAX/MIRALAX) powder Take 17 g by mouth 2 (two) times daily as needed. 03/05/16   Maren Reamer, MD  predniSONE (DELTASONE) 20 MG tablet Take 3 tablets (60 mg total) by mouth daily with breakfast. Day 1-3, take 60mg   (=3 tabs) qam, day 4-7 take 40mg  qam (=2 tabs), day 8-10 take 1 tab daily, than stop Patient not taking: Reported on 12/23/2016 09/22/16   Maren Reamer, MD  traMADol (ULTRAM) 50 MG tablet TAKE 1 TABLET BY MOUTH EVERY 6 HOURS AS NEEDED 12/16/16   Maren Reamer, MD     Objective:   Vitals:   12/29/16 1109  BP: (!) 145/82  Pulse: 84  Resp: 16  Temp: 97.8 F (36.6 C)  TempSrc: Oral  SpO2: 99%  Weight: 298 lb (135.2 kg)    Exam General appearance : Awake, alert, not  in any distress. Speech Clear. Not toxic looking, morbid obese, pleasant. HEENT: Atraumatic and Normocephalic, pupils equally reactive to light. Neck: supple, no JVD.  Chest:Good air entry bilaterally, no added sounds. CVS: S1 S2 regular, no murmurs/gallups or rubs. Abdomen: Bowel sounds active,  not distended with no gaurding, rigidity or rebound.  Pt states more tender on LUQ on exam, mild. Neg Murphy's sign.  Limited exam due to body habitus. Extremities: B/L Lower Ext shows no edema, both legs are warm to touch Neurology: Awake alert, and oriented X 3, CN II-XII grossly intact, Non focal Skin:No Rash  Data Review Lab Results  Component Value Date   HGBA1C 5.4 08/05/2016   HGBA1C 5.2 12/09/2015   HGBA1C 5.6 03/04/2015    Depression screen [facility] 2/9 12/29/2016 11/18/2016 10/19/2016 09/22/2016 08/05/2016  Decreased Interest 2 2 (No Data) 1 2  Down, Depressed, Hopeless 3 2 (No Data) 2 2  PHQ - 2 Score 5 4 - 3 4  Altered sleeping 2 2 (No Data) 1 2  Tired, decreased energy 3 3 (No Data) 3 3  Change in appetite 3 3 (No Data) 3 3  Feeling bad or failure about yourself  0 0 (No Data) 0 (No Data)  Trouble concentrating 2 2 (No Data) 1 2  Moving slowly or fidgety/restless (No Data) 2 (No Data) 3 2  Suicidal thoughts 1 0 (No Data) 0 0  PHQ-9 Score 16 16 - 14 16  Difficult doing work/chores - - - - -    ruq [name] 04/20/15 IMPRESSION: Cholelithiasis without evidence of cholecystitis.   Electronically Signed   By:  Lucienne Capers M.D.   surg path colonscopy 08/06/11 FINAL DIAGNOSIS Diagnosis 1. Colon, polyp(s), descending - POLYPOID COLORECTAL MUCOSA WITH INTRAMUCOSAL LYMPHOID AGGREGATE. NO ADENOMATOUS CHANGE OR MALIGNANCY IDENTIFIED. 2. Small Intestine Biopsy - SLIGHTLY INFLAMED SMALL BOWEL MUCOSA WITH PROMINENT INTRAMUCOSAL LYMPHOID AGGREGATES. NO GRANULOMA OR DYSPLASIA. Assessment & Plan   1. Gastroesophageal reflux disease with esophagitis - suspect worse now that ran out of her ppi. - H. pylori breath test  - off ppi x 3 wks now - would recd changing later to different antacid, but pt not ready. - may need to stop metformin if workup all neg.  2. Generalized abdominal pain, hx of Schatzki ring, requiring dilation 08/2011. Pt has seen DR Manus Rudd at the time. - amb ref gi for abd pain eval, may need repeat egd w/ dilation. - ho cholelithiasis on [name] 7/16, will rechk. - US Abdomen Complete; Future - CMP and Liver - low fat diet recd at this time. - pt does not drink etoh  3. Colon cancer screening, reviewed notes - last colonoscopy screening 08/2011, inflamed small bowel noted, no dysplasia.  - amb ref gi for further eval, may need repeat colonoscopy.  4. Prediabetes - on metformin 250qd,  - POCT glucose (manual entry)     Patient have been counseled extensively about nutrition and exercise  Return in about 3 months (around 03/31/2017), or if symptoms worsen or fail to improve.  The patient was given clear instructions to go to ER or return to medical center if symptoms don't improve, worsen or new problems develop. The patient verbalized understanding. The patient was told to call to get lab results if they haven't heard anything in the next week.   This note has been created with Surveyor, quantity. Any transcriptional errors are unintentional.   Maren Reamer, MD, [facility] [place], [place]   12/29/2016, 11:33 AM

## 2016-12-29 NOTE — Patient Instructions (Signed)
Low-Fat Diet for Pancreatitis or Gallbladder Conditions A low-fat diet can be helpful if you have pancreatitis or a gallbladder condition. With these conditions, your pancreas and gallbladder have trouble digesting fats. A healthy eating plan with less fat will help rest your pancreas and gallbladder and reduce your symptoms. What do I need to know about this diet?  Eat a low-fat diet. ? Reduce your fat intake to less than 20-30% of your total daily calories. This is less than 50-60 g of fat per day. ? Remember that you need some fat in your diet. Ask your dietician what your daily goal should be. ? Choose nonfat and low-fat healthy foods. Look for the words "nonfat," "low fat," or "fat free." ? As a guide, look on the label and choose foods with less than 3 g of fat per serving. Eat only one serving.  Avoid alcohol.  Do not smoke. If you need help quitting, talk with your health care provider.  Eat small frequent meals instead of three large heavy meals. What foods can I eat? Grains Include healthy grains and starches such as potatoes, wheat bread, fiber-rich cereal, and brown rice. Choose whole grain options whenever possible. In adults, whole grains should account for 45-65% of your daily calories. Fruits and Vegetables Eat plenty of fruits and vegetables. Fresh fruits and vegetables add fiber to your diet. Meats and Other Protein Sources Eat lean meat such as chicken and pork. Trim any fat off of meat before cooking it. Eggs, fish, and beans are other sources of protein. In adults, these foods should account for 10-35% of your daily calories. Dairy Choose low-fat milk and dairy options. Dairy includes fat and protein, as well as calcium. Fats and Oils Limit high-fat foods such as fried foods, sweets, baked goods, sugary drinks. Other Creamy sauces and condiments, such as mayonnaise, can add extra fat. Think about whether or not you need to use them, or use smaller amounts or low fat  options. What foods are not recommended?  High fat foods, such as: ? Baked goods. ? Ice cream. ? French toast. ? Sweet rolls. ? Pizza. ? Cheese bread. ? Foods covered with batter, butter, creamy sauces, or cheese. ? Fried foods. ? Sugary drinks and desserts.  Foods that cause gas or bloating This information is not intended to replace advice given to you by your health care provider. Make sure you discuss any questions you have with your health care provider. Document Released: 09/26/2013 Document Revised: 02/27/2016 Document Reviewed: 09/04/2013 Elsevier Interactive Patient Education  2017 [employer]  

## 2016-12-30 ENCOUNTER — Encounter: Payer: Self-pay | Admitting: Internal Medicine

## 2016-12-30 LAB — CMP AND LIVER
ALBUMIN: 4.1 g/dL (ref 3.5–5.5)
ALK PHOS: 78 IU/L (ref 39–117)
ALT: 19 IU/L (ref 0–32)
AST: 17 IU/L (ref 0–40)
BILIRUBIN TOTAL: 0.4 mg/dL (ref 0.0–1.2)
BUN: 10 mg/dL (ref 6–24)
Bilirubin, Direct: 0.11 mg/dL (ref 0.00–0.40)
CHLORIDE: 97 mmol/L (ref 96–106)
CO2: 26 mmol/L (ref 18–29)
Calcium: 9.9 mg/dL (ref 8.7–10.2)
Creatinine, Ser: 0.86 mg/dL (ref 0.57–1.00)
GFR calc Af Amer: 86 mL/min/{1.73_m2} (ref >59)
GFR calc non Af Amer: 75 mL/min/{1.73_m2} (ref >59)
Glucose: 107 mg/dL — ABNORMAL HIGH (ref 65–99)
POTASSIUM: 4.2 mmol/L (ref 3.5–5.2)
Sodium: 140 mmol/L (ref 134–144)
Total Protein: 7.4 g/dL (ref 6.0–8.5)

## 2016-12-31 ENCOUNTER — Other Ambulatory Visit: Payer: Self-pay | Admitting: Internal Medicine

## 2016-12-31 LAB — H. PYLORI BREATH TEST

## 2016-12-31 LAB — H.PYLORI BREATH TEST (REFLEX): H. PYLORI BREATH TEST: NEGATIVE

## 2017-01-04 ENCOUNTER — Telehealth: Payer: Self-pay

## 2017-01-04 MED FILL — GABAPENTIN 300 MG CAPSULE: 300 | 30 days supply | Qty: 120 | Fill #0

## 2017-01-04 MED FILL — ?BUPROPION HCL XL 300 MG TA: 300 | 30 days supply | Qty: 30 | Fill #4

## 2017-01-04 NOTE — Telephone Encounter (Signed)
Contacted pt to go over lab results pt is aware of results and doesn't have any questions or concerns 

## 2017-01-05 ENCOUNTER — Telehealth: Payer: Self-pay

## 2017-01-05 NOTE — Telephone Encounter (Signed)
Pt came by the office and stated she doesn't have a muscle relaxer and Dr. Janne Napoleon didn't send it to the pharmacy. Pt is requesting a muscle relaxer and if it can be sent to our pharmacy

## 2017-01-06 ENCOUNTER — Ambulatory Visit (HOSPITAL_COMMUNITY)
Admission: RE | Admit: 2017-01-06 | Discharge: 2017-01-06 | Disposition: A | Discharge status: Home / Self Care | Payer: Medicaid Other | Source: Ambulatory Visit | Attending: Internal Medicine | Admitting: Internal Medicine

## 2017-01-06 DIAGNOSIS — R143 Flatulence: Secondary | ICD-10-CM | POA: Insufficient documentation

## 2017-01-06 DIAGNOSIS — K760 Fatty (change of) liver, not elsewhere classified: Secondary | ICD-10-CM | POA: Insufficient documentation

## 2017-01-06 DIAGNOSIS — Z8719 Personal history of other diseases of the digestive system: Secondary | ICD-10-CM | POA: Diagnosis present

## 2017-01-06 DIAGNOSIS — R1084 Generalized abdominal pain: Secondary | ICD-10-CM

## 2017-01-06 DIAGNOSIS — K8020 Calculus of gallbladder without cholecystitis without obstruction: Secondary | ICD-10-CM | POA: Diagnosis not present

## 2017-01-07 ENCOUNTER — Ambulatory Visit (INDEPENDENT_AMBULATORY_CARE_PROVIDER_SITE_OTHER)
Admission: RE | Admit: 2017-01-07 | Discharge: 2017-01-07 | Disposition: A | Discharge status: Home / Self Care | Payer: Medicaid Other | Source: Ambulatory Visit | Attending: Pulmonary Disease | Admitting: Pulmonary Disease

## 2017-01-07 DIAGNOSIS — R0609 Other forms of dyspnea: Secondary | ICD-10-CM

## 2017-01-08 ENCOUNTER — Other Ambulatory Visit: Payer: Self-pay | Admitting: Internal Medicine

## 2017-01-08 MED ORDER — METHOCARBAMOL 500 MG PO TABS: 500.0000 mg | ORAL_TABLET | Freq: Three times a day (TID) | ORAL | 1 refills | Status: DC | PRN

## 2017-01-08 MED FILL — METHOCARBAMOL 500 MG TABLET: 500 | 20 days supply | Qty: 60 | Fill #0

## 2017-01-08 NOTE — Telephone Encounter (Signed)
Prescribed

## 2017-01-11 MED FILL — LEVOTHYROXINE 88 MCG TABLET: 88 | 30 days supply | Qty: 30 | Fill #3

## 2017-01-11 MED FILL — LOSARTAN-HCTZ 100-25 MG TAB: 100-25 | 30 days supply | Qty: 30 | Fill #3

## 2017-01-13 ENCOUNTER — Telehealth: Payer: Self-pay

## 2017-01-13 NOTE — Telephone Encounter (Signed)
Contacted pt to go over us results pt is aware and doesn't have any questions or concerns 

## 2017-01-14 NOTE — Progress Notes (Signed)
Spoke with patient and informed her of results. Pt did not have any questions and verbalized understanding. Nothing further is needed.

## 2017-01-19 ENCOUNTER — Encounter: Payer: Self-pay | Admitting: Gastroenterology

## 2017-01-19 ENCOUNTER — Ambulatory Visit (INDEPENDENT_AMBULATORY_CARE_PROVIDER_SITE_OTHER): Payer: Medicaid Other | Admitting: Gastroenterology

## 2017-01-19 VITALS — BP 150/90 | HR 85 | Temp 98.3°F | Ht 68.0 in | Wt 297.0 lb

## 2017-01-19 DIAGNOSIS — K5900 Constipation, unspecified: Secondary | ICD-10-CM

## 2017-01-19 DIAGNOSIS — R1032 Left lower quadrant pain: Secondary | ICD-10-CM | POA: Insufficient documentation

## 2017-01-19 DIAGNOSIS — K219 Gastro-esophageal reflux disease without esophagitis: Secondary | ICD-10-CM | POA: Diagnosis not present

## 2017-01-19 MED ORDER — PANTOPRAZOLE SODIUM 40 MG PO TBEC: 40.0000 mg | DELAYED_RELEASE_TABLET | Freq: Two times a day (BID) | ORAL | 5 refills | Status: DC

## 2017-01-19 MED ORDER — LINACLOTIDE 290 MCG PO CAPS: 290.0000 ug | ORAL_CAPSULE | Freq: Every day | ORAL | 5 refills | Status: DC

## 2017-01-19 NOTE — Assessment & Plan Note (Signed)
Breakthrough hearturn, indigestion on Dexilant. Occurred after coming off medication for several weeks. h.pylori breath test negative. Plan to change to pantoprazole 40mg  bid for now with goal of dropping to once a day once symptoms under control.

## 2017-01-19 NOTE — Assessment & Plan Note (Signed)
Stop Amitiza. Trial of Linzess 270mcg daily. RX provided.

## 2017-01-19 NOTE — Patient Instructions (Signed)
PA info for CT abd/pelvis with contrast submitted via [facility]. Case approved. Auth# [id number]. 01/19/17-02/18/17.

## 2017-01-19 NOTE — Patient Instructions (Signed)
1. Please stop Dexilant and start pantoprazole 40 mg twice daily, 30 minutes before breakfast and 30 minutes before evening meal. Prescription sent to pharmacy. 2. Stop and Amitiza, start Linzess 290 g daily on empty stomach for constipation. Prescription sent to pharmacy. 3. Please have labs done today. 4. CT scan of your abdomen as scheduled. 5. Call with any questions or concerns, for worsening abdominal pain, fever, vomiting, bloody stools etc. go to emergency department.

## 2017-01-19 NOTE — Progress Notes (Signed)
Primary Care Physician:  Maren Reamer, MD  Primary Gastroenterologist:  Garfield Cornea, MD   Chief Complaint  Patient presents with  . Abdominal Pain  . Constipation    HPI:  Robin [name] is a 61 y.o. female here for follow up. Last seen in 2016.   Having heartburn/indigestion/sour burps, nausea. No vomiting. Feels uncomfortable in upper abdominal and into the bottom of her abdomen. She thinks it is gas. Feels full on left lower quadrant all the time. Needs to have left hip replacement and has a lot of pain related to that too. BM very small amount every day but incomplete. No melena, brbpr. Had reported run out of her PPI for several weeks per PCP and that's when her symptoms escalated. amitiza not helping constipation. Constipation been worse for several months. Recent h.pylori breath test negative, done off PPI. PCP has been wanting her off PPI and on different antacids due to her CRI but patient never tolerated the switch. Also contemplating holding metformin if work up negaitve.   Last year very stressful. Mother passed away. Husband had kidney removed for renal cell carcinoma. ?metastasis to the lung now. Gained some weight. Left hip and Left knee replacement needed but surgeon wants her to loose 25 pounds first.   Recent negative chest CT.  Recent abd u/s showed fatty liver, single 1.8cm gallstone.     Current Outpatient Prescriptions  Medication Sig Dispense Refill  . Albuterol Sulfate (PROAIR RESPICLICK) 213 (90 Base) MCG/ACT AEPB Inhale 2 puffs into the lungs as needed. 1 each 1  . amitriptyline (ELAVIL) 50 MG tablet Take 1 tablet (50 mg total) by mouth at bedtime. 60 tablet 3  . amLODipine (NORVASC) 10 MG tablet Take 1 tablet (10 mg total) by mouth daily. 90 tablet 3  . aspirin 81 MG tablet Take 1 tablet (81 mg total) by mouth at bedtime. Reported on 12/09/2015 90 tablet 3  . benzonatate (TESSALON) 100 MG capsule Take 2 capsules (200 mg total) by mouth 3 (three) times daily  as needed for cough. 21 capsule 0  . budesonide (RHINOCORT AQUA) 32 MCG/ACT nasal spray Place 2 sprays into both nostrils daily. 1 Bottle 5  . buPROPion (WELLBUTRIN XL) 300 MG 24 hr tablet Take 1 tablet (300 mg total) by mouth every morning. Reported on 12/09/2015 90 tablet 3  . citalopram (CELEXA) 20 MG tablet Take 1 tablet (20 mg total) by mouth daily. 60 tablet 2  . dexlansoprazole (DEXILANT) 60 MG capsule Take 1 capsule (60 mg total) by mouth daily. 90 capsule 2  . diclofenac sodium (VOLTAREN) 1 % GEL Apply 2 g topically 4 (four) times daily. 100 g 2  . ferrous sulfate 325 (65 FE) MG tablet TAKE 1 TABLET BY MOUTH 2 TIMES DAILY WITH A MEAL. 60 tablet 0  . fexofenadine (ALLEGRA) 180 MG tablet Take 1 tablet (180 mg total) by mouth daily. Reported on 12/09/2015 30 tablet 3  . fluticasone (FLONASE) 50 MCG/ACT nasal spray Place 2 sprays into both nostrils daily. 16 g 6  . gabapentin (NEURONTIN) 300 MG capsule Take 3 capsules (900 mg total) by mouth 3 (three) times daily. 180 capsule 2  . hydrALAZINE (APRESOLINE) 25 MG tablet Take 1 tablet (25 mg total) by mouth 3 (three) times daily. 90 tablet 3  . ipratropium (ATROVENT) 0.06 % nasal spray Place 2 sprays into both nostrils 4 (four) times daily. 15 mL 1  . levothyroxine (SYNTHROID, LEVOTHROID) 88 MCG tablet Take 1 tablet (88 mcg total)  by mouth daily. 90 tablet 2  . loratadine (CLARITIN) 10 MG tablet Take 1 tablet (10 mg total) by mouth daily. 30 tablet 11  . losartan-hydrochlorothiazide (HYZAAR) 100-25 MG tablet Take 1 tablet by mouth daily. Reported on 12/09/2015 90 tablet 2  . lubiprostone (AMITIZA) 24 MCG capsule Take 1 capsule (24 mcg total) by mouth 2 (two) times daily as needed for constipation. 180 capsule 0  . metFORMIN (GLUCOPHAGE) 500 MG tablet Take 0.5 tablets (250 mg total) by mouth daily with breakfast. 45 tablet 3  . methocarbamol (ROBAXIN) 500 MG tablet Take 1 tablet (500 mg total) by mouth every 8 (eight) hours as needed for muscle spasms.  60 tablet 1  . Multiple Vitamin (MULTIVITAMIN) tablet Take 1 tablet by mouth daily. Reported on 12/09/2015 90 tablet 1  . naproxen (NAPROSYN) 500 MG tablet Take 1 tablet (500 mg total) by mouth 2 (two) times daily with a meal. 30 tablet 0  . Omega-3 Fatty Acids (FISH OIL PO) Take 1 capsule by mouth at bedtime. Reported on 12/09/2015    . polyethylene glycol powder (GLYCOLAX/MIRALAX) powder Take 17 g by mouth 2 (two) times daily as needed. 3350 g 1  . traMADol (ULTRAM) 50 MG tablet TAKE 1 TABLET BY MOUTH EVERY 6 HOURS AS NEEDED 60 tablet 0  . guaiFENesin-codeine (ROBITUSSIN AC) 100-10 MG/5ML syrup Take 10 mLs by mouth 4 (four) times daily as needed for cough. (Patient not taking: Reported on 01/19/2017) 180 mL 0   No current facility-administered medications for this visit.     Allergies as of 01/19/2017  . (No Known Allergies)    Past Medical History:  Diagnosis Date  . Anxiety disorder   . Arthritis   . Chronic neck pain   . Constipation   . Diabetes mellitus without complication ([place])   . GERD (gastroesophageal reflux disease)   . Hiatal hernia    small  . Hyperlipidemia   . Hypertension   . Schatzki's ring    non critical  . Sleep apnea    CPAP, Sleep study at [place]    Past Surgical History:  Procedure Laterality Date  . ABDOMINAL HYSTERECTOMY    . ABDOMINAL HYSTERECTOMY  02/18/2000  . CARDIAC CATHETERIZATION  08/17/2001   normal L main/LAD/L Cfx/RCA (Dr. Adora Fridge)  . COLONOSCOPY  2006   Dr. [name], hyperplastic polyps  . COLONOSCOPY  08/06/11   abnormal terminal ileum for 10cm, erosions, geographical ulceration. Bx small bowel mucosa with prominent intramucosal lymphoid aggregates, slightly inflammed  . DIRECT LARYNGOSCOPY  03/15/2012   Procedure: DIRECT LARYNGOSCOPY;  Surgeon: [name], MD;  Location: [facility];  Service: ENT;  Laterality: N/A; Dr. Wolicki--:>no foreign body seen. normal esophagus to 40cm  . ESOPHAGEAL DILATION  04/17/2015    Procedure: ESOPHAGEAL DILATION;  Surgeon: [name], MD;  [facility];  Service: Endoscopy;;  . ESOPHAGOGASTRODUODENOSCOPY  08/23/2008   HEN:IDPO distal esophageal erosions consistent with mild erosive reflux esophagitis, otherwise unremarkable esophagus/ Tiny antral erosions of doubtful clinical significance, otherwise normal stomach, patent pylorus, normal D1 and D2  . ESOPHAGOGASTRODUODENOSCOPY  08/06/11   small hh, noncritical Schatzki's ring s/p 9 F  . ESOPHAGOGASTRODUODENOSCOPY N/A 04/17/2015   EUM:PNTIRW s/p dilation  . ESOPHAGOSCOPY  03/15/2012   Procedure: ESOPHAGOSCOPY;  Surgeon: [name], MD;  Location: [facility];  Service: ENT;  Laterality: N/A;  . KNEE ARTHROSCOPY  04/27/2001  . NM MYOCAR PERF WALL MOTION  2003   negative bruce protocol  exercise tress test; EF 68%; intermediate risk study due to evidence of anterior wall ischemia extending from mid-ventricle to apex  . 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 RECTOCELE REPAIR    . TRANSTHORACIC ECHOCARDIOGRAM  2003   EF normal  . VAGINAL PROLAPSE REPAIR      Family History  Problem Relation Age of Onset  . Kidney cancer Father   . Hypertension Father   . Hyperlipidemia Father   . Diabetes Mother   . Hypertension Mother   . Heart Problems Mother   . Hyperlipidemia Mother   . Asthma Mother   . Liver disease Brother   . Arthritis Brother   . Hypertension Sister   . Allergic rhinitis Sister   . Stomach cancer      aunt  . Breast cancer Maternal Grandmother   . Kidney disease Maternal Grandfather   . Breast cancer Paternal Grandmother   . Hypertension Brother   . Hyperlipidemia Brother   . Hypertension Brother   . Hypertension Sister   . Hyperlipidemia Sister   . Lupus Maternal Aunt   . Colon cancer Neg Hx   . Eczema Neg Hx   . Immunodeficiency Neg Hx   . Urticaria Neg Hx     Social History   Social History  . Marital status: Married    Spouse name: N/A  . Number of  children: 4  . Years of education: 10   Occupational History  .  Unemployed   Social History Main Topics  . Smoking status: Former Smoker    Packs/day: 0.50    Years: 10.00    Types: Cigarettes    Start date: 07/14/1975    Quit date: 04/04/1993  . Smokeless tobacco: Never Used     Comment: 17 yrs ago  . Alcohol use No  . Drug use: No  . Sexual activity: Not on file   Other Topics Concern  . Not on file   Social History Narrative   Lives with husband in an apartment on the first floor.  Has 4 children.     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  Trying to get disability.  Formerly worked as a Teacher, early years/pre.  Education: 11th grade.      [facility] (12/23/16):   Originally from [facility]. She was raised in [place]. 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 No pets currently. No bird exposure. She does have mold in her current home in the bathroom, laundry room, & master bedroom.       ROS:  General: Negative for anorexia, weight loss, fever, chills, fatigue, weakness. Eyes: Negative for vision changes.  ENT: Negative for hoarseness, difficulty swallowing , nasal congestion. CV: Negative for chest pain, angina, palpitations, dyspnea on exertion, peripheral edema.  Respiratory: Negative for dyspnea at rest, dyspnea on exertion, cough, sputum, wheezing.  GI: See history of present illness. GU:  Negative for dysuria, hematuria, urinary incontinence, urinary frequency, nocturnal urination.  MS: + for joint pain, low back pain.  Derm: Negative for rash or itching.  Neuro: Negative for weakness, abnormal sensation, seizure, frequent headaches, memory loss, confusion.  Psych: Negative for anxiety, depression, suicidal ideation, hallucinations.  Endo: Negative for unusual weight change.  Heme: Negative for bruising or  bleeding. Allergy: Negative for  rash or hives.    Physical Examination:  BP (!) 150/90   Pulse 85   Temp 98.3 F (36.8 C) (Oral)   Ht 5\' 8"  (1.727 m)   Wt 297 lb (134.7 kg)   BMI 45.16 kg/m    General: Well-nourished, well-developed in no acute distress. Appears uncomfortable. Head: Normocephalic, atraumatic.   Eyes: Conjunctiva pink, no icterus. Mouth: Oropharyngeal mucosa moist and pink , no lesions erythema or exudate. Neck: Supple without thyromegaly, masses, or lymphadenopathy.  Lungs: Clear to auscultation bilaterally.  Heart: Regular rate and rhythm, no murmurs rubs or gallops.  Abdomen: Bowel sounds are normal, LLQ moderate tender, nondistended, no hepatosplenomegaly or masses, no abdominal bruits or    hernia , no rebound or guarding.   Rectal: not performed Extremities: No lower extremity edema. No clubbing or deformities.  Neuro: Alert and oriented x 4 , grossly normal neurologically.  Skin: Warm and dry, no rash or jaundice.   Psych: Alert and cooperative, normal mood and affect.  Labs: H pylori breath test negative on 12/2016 Lab Results  Component Value Date   CREATININE 0.86 12/29/2016   BUN 10 12/29/2016   NA 140 12/29/2016   K 4.2 12/29/2016   CL 97 12/29/2016   CO2 26 12/29/2016   Lab Results  Component Value Date   ALT 19 12/29/2016   AST 17 12/29/2016   ALKPHOS 78 12/29/2016   BILITOT 0.4 12/29/2016    Lab Results  Component Value Date   INR 1.0 12/23/2016   Lab Results  Component Value Date   WBC 6.9 09/22/2016   HGB 12.4 09/22/2016   HCT 39.2 09/22/2016   MCV 89.3 09/22/2016   PLT 381 09/22/2016     Imaging Studies: Ct Chest Wo Contrast  Result Date: 01/07/2017 CLINICAL DATA:  Shortness of breath. EXAM: CT CHEST WITHOUT CONTRAST TECHNIQUE: Multidetector CT imaging of the chest was performed following the standard protocol without IV contrast. COMPARISON:  12/13/2015 FINDINGS: Cardiovascular: Normal heart size. No pericardial  effusion. Mild atherosclerotic calcification of the aortic arch. Mediastinum/Nodes: No adenopathy or mass. Normal appearance of the esophagus. Lungs/Pleura: Lungs are clear. No pleural effusion or pneumothorax. No suspicious nodule. Upper Abdomen: Negative Musculoskeletal: No acute or aggressive finding. Notable degenerative changes of the bilateral sternoclavicular joint. IMPRESSION: Negative chest CT.  No explanation for shortness of breath. Electronically Signed   By: Monte Fantasia M.D.   On: 01/07/2017 09:32   US Abdomen Complete  Result Date: 01/06/2017 CLINICAL DATA:  Abdominal pain for 1 month, large body habitus EXAM: ABDOMEN ULTRASOUND COMPLETE COMPARISON:  Limited ultrasound of the right upper quadrant of 04/20/2015 FINDINGS: Gallbladder: The gallbladder is visualized and a single gallstone is noted of 1.8 cm in diameter with acoustical shadowing. No gallbladder wall thickening is seen and there is no pain over the gallbladder with compression. Common bile duct: Diameter: The common bile duct is normal measuring 3.4 mm in diameter. Liver: The parenchyma of the liver is echogenic consistent with fatty infiltration. No focal hepatic abnormality is seen. IVC: No abnormality visualized. Pancreas: Only the midportion of the pancreas is visualized in the pancreatic duct is not dilated. Portions of both the head and tail of pancreas are obscured by bowel gas. Spleen: The spleen measures 7.2 cm. Right Kidney: Length: 12.5 cm.  No hydronephrosis is seen. Left Kidney: Length: 12.3 cm.  No hydronephrosis is noted. Abdominal aorta: The abdominal aorta is normal in caliber. The bifurcation is obscured by bowel gas. Other findings: None. IMPRESSION:  1. Single 1.8 cm gallstone. No present evidence of acute cholecystitis. 2. Echogenic liver parenchyma consistent with diffuse fatty infiltration. 3. Portions of the pancreas are obscured by bowel gas. Electronically Signed   By: Ivar Drape M.D.   On: 01/06/2017 09:25

## 2017-01-19 NOTE — Assessment & Plan Note (Signed)
LLQ tenderness in setting of constipation. Last TCS 2012 with abnormal TI for 10cm with erosions/ulceration. BX small bowel mucosa with prominent intramucosal lymphoid aggregates, slightly inflammed. No h/o precancerous polyps of FH of CRC.   CT A/P with contrast as initial evaluation, given significant pain on exam. No prior h/o diverticula seen back in 2012  but cannot exclude diverticulitis (could have developed in the interim). Labs. Further recommendations to follow. Alarm symptoms discussed with patient.

## 2017-01-20 ENCOUNTER — Ambulatory Visit: Payer: Medicaid Other | Attending: Internal Medicine

## 2017-01-20 ENCOUNTER — Other Ambulatory Visit: Payer: Self-pay | Admitting: Internal Medicine

## 2017-01-20 DIAGNOSIS — K219 Gastro-esophageal reflux disease without esophagitis: Secondary | ICD-10-CM | POA: Diagnosis present

## 2017-01-20 MED FILL — AMLODIPINE BESYLATE 10 MG T: 10 | 30 days supply | Qty: 30 | Fill #4

## 2017-01-20 MED FILL — raNITIdine HCL 150 MG TABS: 150 | 30 days supply | Qty: 30 | Fill #0

## 2017-01-20 MED FILL — AMITRIPTYLINE HCL 50 MG TAB: 50 | 30 days supply | Qty: 30 | Fill #1

## 2017-01-20 NOTE — Progress Notes (Signed)
CC'ED TO PCP 

## 2017-01-20 NOTE — Progress Notes (Signed)
Patient here for lab visit only 

## 2017-01-21 ENCOUNTER — Telehealth: Payer: Self-pay | Admitting: Internal Medicine

## 2017-01-21 LAB — CBC WITH DIFFERENTIAL/PLATELET
Basophils Absolute: 0 x10E3/uL (ref 0.0–0.2)
Basos: 1 %
EOS (ABSOLUTE): 0.1 x10E3/uL (ref 0.0–0.4)
Eos: 1 %
Hematocrit: 38.2 % (ref 34.0–46.6)
Hemoglobin: 12 g/dL (ref 11.1–15.9)
Immature Grans (Abs): 0 x10E3/uL (ref 0.0–0.1)
Immature Granulocytes: 0 %
Lymphocytes Absolute: 3.4 x10E3/uL — ABNORMAL HIGH (ref 0.7–3.1)
Lymphs: 62 %
MCH: 28.2 pg (ref 26.6–33.0)
MCHC: 31.4 g/dL — ABNORMAL LOW (ref 31.5–35.7)
MCV: 90 fL (ref 79–97)
Monocytes Absolute: 0.6 x10E3/uL (ref 0.1–0.9)
Monocytes: 11 %
Neutrophils Absolute: 1.4 x10E3/uL (ref 1.4–7.0)
Neutrophils: 25 %
Platelets: 356 x10E3/uL (ref 150–379)
RBC: 4.25 x10E6/uL (ref 3.77–5.28)
RDW: 13.1 % (ref 12.3–15.4)
WBC: 5.5 x10E3/uL (ref 3.4–10.8)

## 2017-01-21 LAB — BASIC METABOLIC PANEL
BUN / CREAT RATIO: 10 (ref 9–23)
BUN: 8 mg/dL (ref 6–24)
CO2: 25 mmol/L (ref 18–29)
Calcium: 9.5 mg/dL (ref 8.7–10.2)
Chloride: 100 mmol/L (ref 96–106)
Creatinine, Ser: 0.78 mg/dL (ref 0.57–1.00)
GFR calc non Af Amer: 84 mL/min/{1.73_m2} (ref >59)
GFR, EST AFRICAN AMERICAN: 97 mL/min/{1.73_m2} (ref >59)
Glucose: 99 mg/dL (ref 65–99)
POTASSIUM: 4.1 mmol/L (ref 3.5–5.2)
SODIUM: 142 mmol/L (ref 134–144)

## 2017-01-21 MED ORDER — PANTOPRAZOLE SODIUM 40 MG PO TBEC: 40.0000 mg | DELAYED_RELEASE_TABLET | Freq: Two times a day (BID) | ORAL | 5 refills | Status: DC

## 2017-01-21 MED ORDER — LINACLOTIDE 290 MCG PO CAPS: 290.0000 ug | ORAL_CAPSULE | Freq: Every day | ORAL | 5 refills | Status: DC

## 2017-01-21 MED FILL — traMADol HCL 50 MG TABS: 50 | 15 days supply | Qty: 60 | Fill #0

## 2017-01-21 MED FILL — PANTOPRAZOLE SOD DR 40 MG T: 40 | 30 days supply | Qty: 60 | Fill #0

## 2017-01-21 MED FILL — !LINZESS 290 MCG CAPSULE: 290 | 30 days supply | Qty: 30 | Fill #0

## 2017-01-21 NOTE — Telephone Encounter (Signed)
Pt called to say that she was seen recently and the pharmacy hasn't received her prescriptions. Please call [phone]

## 2017-01-21 NOTE — Telephone Encounter (Signed)
Spoke with the Robin [name], informed her that the rx's were sent to the [facility] but she said the system was down and wanted to know if it could be sent to [facility].

## 2017-01-21 NOTE — Addendum Note (Signed)
Addended by: Mahala Menghini on: 01/21/2017 02:53 PM   Modules accepted: Orders

## 2017-01-21 NOTE — Telephone Encounter (Signed)
done

## 2017-01-25 NOTE — Progress Notes (Signed)
Labs unremarkable. Await ct.

## 2017-01-26 ENCOUNTER — Ambulatory Visit (HOSPITAL_COMMUNITY)
Admission: RE | Admit: 2017-01-26 | Discharge: 2017-01-26 | Disposition: A | Discharge status: Home / Self Care | Payer: Medicaid Other | Source: Ambulatory Visit | Attending: Gastroenterology | Admitting: Gastroenterology

## 2017-01-26 DIAGNOSIS — Z90710 Acquired absence of both cervix and uterus: Secondary | ICD-10-CM | POA: Diagnosis not present

## 2017-01-26 DIAGNOSIS — M12852 Other specific arthropathies, not elsewhere classified, left hip: Secondary | ICD-10-CM | POA: Insufficient documentation

## 2017-01-26 DIAGNOSIS — K219 Gastro-esophageal reflux disease without esophagitis: Secondary | ICD-10-CM | POA: Insufficient documentation

## 2017-01-26 DIAGNOSIS — K5900 Constipation, unspecified: Secondary | ICD-10-CM | POA: Insufficient documentation

## 2017-01-26 DIAGNOSIS — R1032 Left lower quadrant pain: Secondary | ICD-10-CM | POA: Diagnosis present

## 2017-01-26 DIAGNOSIS — M12851 Other specific arthropathies, not elsewhere classified, right hip: Secondary | ICD-10-CM | POA: Diagnosis not present

## 2017-01-26 MED ORDER — IOPAMIDOL (ISOVUE-300) INJECTION 61%
INTRAVENOUS | Status: AC
  Administered 2017-01-26: 100 mL
  Filled 2017-01-26: qty 100

## 2017-01-26 MED FILL — ?METFORMIN HCL 500MG TABLET: 500 | 30 days supply | Qty: 15 | Fill #1

## 2017-02-02 ENCOUNTER — Ambulatory Visit: Payer: Medicaid Other | Attending: Internal Medicine

## 2017-02-03 MED FILL — hydrALAZINE HCL 25 MG TABS: 25 | 30 days supply | Qty: 90 | Fill #1

## 2017-02-03 MED FILL — ?BUPROPION HCL XL 300 MG TA: 300 | 30 days supply | Qty: 30 | Fill #5

## 2017-02-03 MED FILL — GABAPENTIN 300 MG CAPSULE: 300 | 30 days supply | Qty: 120 | Fill #1

## 2017-02-03 NOTE — Progress Notes (Signed)
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 We need to make sure her orthopedic provider knows of these findings. Thanks!

## 2017-02-03 NOTE — Progress Notes (Signed)
Please let patient know her CT shows prominent hip changes patient aware of, colon normal. Mildly prominent mesenteric lymph nodes, likely reactive. Significance unknown. WILL ASK DR [name] FOR HIS OPINION REGARDING LYMPH NODES.  How is she doing since OV?

## 2017-02-04 ENCOUNTER — Ambulatory Visit: Payer: Medicaid Other | Attending: Family Medicine | Admitting: Family Medicine

## 2017-02-04 ENCOUNTER — Encounter: Payer: Self-pay | Admitting: Family Medicine

## 2017-02-04 ENCOUNTER — Other Ambulatory Visit: Payer: Self-pay | Admitting: Family Medicine

## 2017-02-04 VITALS — BP 130/79 | HR 80 | Temp 98.4°F | Resp 18 | Ht 68.0 in | Wt 299.6 lb

## 2017-02-04 DIAGNOSIS — Z7984 Long term (current) use of oral hypoglycemic drugs: Secondary | ICD-10-CM | POA: Diagnosis not present

## 2017-02-04 DIAGNOSIS — Z79899 Other long term (current) drug therapy: Secondary | ICD-10-CM | POA: Diagnosis not present

## 2017-02-04 DIAGNOSIS — E119 Type 2 diabetes mellitus without complications: Secondary | ICD-10-CM | POA: Insufficient documentation

## 2017-02-04 DIAGNOSIS — J029 Acute pharyngitis, unspecified: Secondary | ICD-10-CM | POA: Diagnosis not present

## 2017-02-04 DIAGNOSIS — R7303 Prediabetes: Secondary | ICD-10-CM | POA: Insufficient documentation

## 2017-02-04 DIAGNOSIS — Z7982 Long term (current) use of aspirin: Secondary | ICD-10-CM | POA: Diagnosis not present

## 2017-02-04 DIAGNOSIS — H66002 Acute suppurative otitis media without spontaneous rupture of ear drum, left ear: Secondary | ICD-10-CM | POA: Diagnosis not present

## 2017-02-04 DIAGNOSIS — R05 Cough: Secondary | ICD-10-CM | POA: Insufficient documentation

## 2017-02-04 DIAGNOSIS — R519 Headache, unspecified: Secondary | ICD-10-CM

## 2017-02-04 DIAGNOSIS — R51 Headache: Secondary | ICD-10-CM | POA: Insufficient documentation

## 2017-02-04 LAB — GLUCOSE, POCT (MANUAL RESULT ENTRY): POC Glucose: 103 mg/dl — AB (ref 70–99)

## 2017-02-04 LAB — POCT RAPID STREP A (OFFICE): Rapid Strep A Screen: NEGATIVE

## 2017-02-04 LAB — POCT GLYCOSYLATED HEMOGLOBIN (HGB A1C): HEMOGLOBIN A1C: 5.6

## 2017-02-04 MED ORDER — AMOXICILLIN 875 MG PO TABS: 875.0000 mg | ORAL_TABLET | Freq: Two times a day (BID) | ORAL | 0 refills | Status: DC

## 2017-02-04 MED ORDER — ACETAMINOPHEN 500 MG PO TABS: 500.0000 mg | ORAL_TABLET | Freq: Four times a day (QID) | ORAL | 0 refills | Status: DC | PRN

## 2017-02-04 MED ORDER — FLUTICASONE PROPIONATE 50 MCG/ACT NA SUSP: 2.0000 | Freq: Every day | NASAL | 6 refills | Status: DC

## 2017-02-04 MED ORDER — AMOXICILLIN 500 MG PO TABS: 1000.0000 mg | ORAL_TABLET | Freq: Two times a day (BID) | ORAL | 0 refills | Status: AC

## 2017-02-04 MED ORDER — FEXOFENADINE HCL 180 MG PO TABS: 180.0000 mg | ORAL_TABLET | Freq: Every day | ORAL | 3 refills | Status: DC

## 2017-02-04 MED FILL — AMOXICILLIN 500 MG CAPSULE: 500 | 10 days supply | Qty: 20 | Fill #0

## 2017-02-04 MED FILL — FLUTICASONE PROP 50 MCG SPR: 50 | 30 days supply | Qty: 16 | Fill #0

## 2017-02-04 NOTE — Progress Notes (Signed)
Patient is here for sore throat  Patient also stated that it hurst when swallow & left ear aching  Patient been feeling like this since couple days ago  Patient stated that has some runny nose & coughing

## 2017-02-04 NOTE — Progress Notes (Signed)
Subjective:  Patient ID: Robin [name], female    DOB: 09/03/1956  Age: 61 y.o. MRN: [id number]  CC: Cough   HPI Robin [name] presents for   Headache: Patient presents with headache. Symptoms began about 2 days ago. Generally, the headaches occur intermittent. The headache do not seem to be related to any time of the day. The headaches are usually moderate and are frontal, upper face in location.  Recently, the headaches are increasing in severity. Work and daily activities are not affected by the headaches. Precipitating factors include weather changes. The headaches are usually not preceded by an aura. Associated neurologic symptoms which are present include: none. The patient denies dizziness, loss of balance, speech difficulties, vision problems and vomiting in the early morning. Other associated symptoms include: earache, nasal congestion, rhinorrhea, sneezing and sore throat.  Home treatment has included over the counter flonase and allegra with inadequate improvement. Other history includes: allergic rhinitis and recent sick contact. Family history includes no known family members with significant headaches.    Outpatient Medications Prior to Visit  Medication Sig Dispense Refill  . Albuterol Sulfate (PROAIR RESPICLICK) 854 (90 Base) MCG/ACT AEPB Inhale 2 puffs into the lungs as needed. 1 each 1  . amitriptyline (ELAVIL) 50 MG tablet Take 1 tablet (50 mg total) by mouth at bedtime. 60 tablet 3  . amLODipine (NORVASC) 10 MG tablet Take 1 tablet (10 mg total) by mouth daily. 90 tablet 3  . aspirin 81 MG tablet Take 1 tablet (81 mg total) by mouth at bedtime. Reported on 12/09/2015 90 tablet 3  . benzonatate (TESSALON) 100 MG capsule Take 2 capsules (200 mg total) by mouth 3 (three) times daily as needed for cough. 21 capsule 0  . buPROPion (WELLBUTRIN XL) 300 MG 24 hr tablet Take 1 tablet (300 mg total) by mouth every morning. Reported on 12/09/2015 90 tablet 3  . citalopram (CELEXA) 20  MG tablet Take 1 tablet (20 mg total) by mouth daily. 60 tablet 2  . diclofenac sodium (VOLTAREN) 1 % GEL Apply 2 g topically 4 (four) times daily. 100 g 2  . ferrous sulfate 325 (65 FE) MG tablet TAKE 1 TABLET BY MOUTH 2 TIMES DAILY WITH A MEAL. 60 tablet 0  . gabapentin (NEURONTIN) 300 MG capsule Take 3 capsules (900 mg total) by mouth 3 (three) times daily. 180 capsule 2  . ipratropium (ATROVENT) 0.06 % nasal spray Place 2 sprays into both nostrils 4 (four) times daily. 15 mL 1  . levothyroxine (SYNTHROID, LEVOTHROID) 88 MCG tablet Take 1 tablet (88 mcg total) by mouth daily. 90 tablet 2  . linaclotide (LINZESS) 290 MCG CAPS capsule Take 1 capsule (290 mcg total) by mouth daily before breakfast. 30 capsule 5  . loratadine (CLARITIN) 10 MG tablet Take 1 tablet (10 mg total) by mouth daily. 30 tablet 11  . losartan-hydrochlorothiazide (HYZAAR) 100-25 MG tablet Take 1 tablet by mouth daily. Reported on 12/09/2015 90 tablet 2  . metFORMIN (GLUCOPHAGE) 500 MG tablet Take 0.5 tablets (250 mg total) by mouth daily with breakfast. 45 tablet 3  . methocarbamol (ROBAXIN) 500 MG tablet Take 1 tablet (500 mg total) by mouth every 8 (eight) hours as needed for muscle spasms. 60 tablet 1  . Multiple Vitamin (MULTIVITAMIN) tablet Take 1 tablet by mouth daily. Reported on 12/09/2015 90 tablet 1  . naproxen (NAPROSYN) 500 MG tablet Take 1 tablet (500 mg total) by mouth 2 (two) times daily with a meal. 30 tablet  0  . Omega-3 Fatty Acids (FISH OIL PO) Take 1 capsule by mouth at bedtime. Reported on 12/09/2015    . pantoprazole (PROTONIX) 40 MG tablet Take 1 tablet (40 mg total) by mouth 2 (two) times daily before a meal. 60 tablet 5  . polyethylene glycol powder (GLYCOLAX/MIRALAX) powder Take 17 g by mouth 2 (two) times daily as needed. 3350 g 1  . ranitidine (ZANTAC) 150 MG tablet TAKE 1 TABLET BY MOUTH AT BEDTIME. 30 tablet 5  . traMADol (ULTRAM) 50 MG tablet TAKE 1 TABLET BY MOUTH EVERY 6 HOURS 60 tablet 0  .  budesonide (RHINOCORT AQUA) 32 MCG/ACT nasal spray Place 2 sprays into both nostrils daily. 1 Bottle 5  . fexofenadine (ALLEGRA) 180 MG tablet Take 1 tablet (180 mg total) by mouth daily. Reported on 12/09/2015 30 tablet 3  . fluticasone (FLONASE) 50 MCG/ACT nasal spray Place 2 sprays into both nostrils daily. 16 g 6  . guaiFENesin-codeine (ROBITUSSIN AC) 100-10 MG/5ML syrup Take 10 mLs by mouth 4 (four) times daily as needed for cough. (Patient not taking: Reported on 01/19/2017) 180 mL 0  . hydrALAZINE (APRESOLINE) 25 MG tablet Take 1 tablet (25 mg total) by mouth 3 (three) times daily. 90 tablet 3   No facility-administered medications prior to visit.     ROS Review of Systems  Constitutional: Negative.   HENT: Positive for congestion, ear pain, rhinorrhea and sneezing. Negative for ear discharge.   Respiratory: Negative.   Cardiovascular: Negative.   Gastrointestinal: Negative.   Skin: Negative.   Neurological: Positive for headaches.   Objective:  BP 130/79 (BP Location: Left Arm, Patient Position: Sitting, Cuff Size: Normal)   Pulse 80   Temp 98.4 F (36.9 C) (Oral)   Resp 18   Ht 5\' 8"  (1.727 m)   Wt 299 lb 9.6 oz (135.9 kg)   SpO2 97%   BMI 45.55 kg/m   BP/Weight 02/04/2017 01/19/2017 0/06/3817  Systolic BP 299 371 696  Diastolic BP 79 90 82  Wt. (Lbs) 299.6 297 298  BMI 45.55 45.16 45.31   Physical Exam  HENT:  Head: Normocephalic.  Right Ear: External ear normal.  Left Ear: There is tenderness. No drainage. There is mastoid tenderness. Decreased hearing (muffled hearing) is noted.  Eyes: Conjunctivae are normal. Pupils are equal, round, and reactive to light.  Cardiovascular: Normal rate, regular rhythm, normal heart sounds and intact distal pulses.   Pulmonary/Chest: Effort normal and breath sounds normal.  Abdominal: Soft. Bowel sounds are normal.  Skin: Skin is warm and dry.  Nursing note and vitals reviewed.  Assessment & Plan:   Problem List Items Addressed  This Visit      Endocrine   Diabetes mellitus without complication ([place])   Relevant Orders   Glucose (CBG) (Completed)   HgB A1c (Completed)    Other Visit Diagnoses    Acute suppurative otitis media of left ear without spontaneous rupture of tympanic membrane, recurrence not specified    -  Primary   Relevant Medications            amoxicillin (AMOXIL) 875 MG tablet   Acute sore throat       Relevant Orders   Rapid Strep A (Completed)   Upper Respiratory Culture, Routine   Sinus headache       Relevant Medications   acetaminophen (TYLENOL) 500 MG tablet   fexofenadine (ALLEGRA) 180 MG tablet   fluticasone (FLONASE) 50 MCG/ACT nasal spray  Follow-up: Return if symptoms worsen or fail to improve.   Alfonse Spruce FNP

## 2017-02-04 NOTE — Patient Instructions (Signed)
Otitis Media, Adult Otitis media is redness, soreness, and puffiness (swelling) in the space just behind your eardrum (middle ear). It may be caused by allergies or infection. It often happens along with a cold. Follow these instructions at home:  Take your medicine as told. Finish it even if you start to feel better.  Only take over-the-counter or prescription medicines for pain, discomfort, or fever as told by your doctor.  Follow up with your doctor as told. Contact a doctor if:  You have otitis media only in one ear, or bleeding from your nose, or both.  You notice a lump on your neck.  You are not getting better in 3-5 days.  You feel worse instead of better. Get help right away if:  You have pain that is not helped with medicine.  You have puffiness, redness, or pain around your ear.  You get a stiff neck.  You cannot move part of your face (paralysis).  You notice that the bone behind your ear hurts when you touch it. This information is not intended to replace advice given to you by your health care provider. Make sure you discuss any questions you have with your health care provider. Document Released: 03/09/2008 Document Revised: 02/27/2016 Document Reviewed: 04/18/2013 Elsevier Interactive Patient Education  2017 Elsevier Inc.  

## 2017-02-07 LAB — UPPER RESPIRATORY CULTURE, ROUTINE

## 2017-02-08 MED FILL — LEVOTHYROXINE 88 MCG TABLET: 88 | 30 days supply | Qty: 30 | Fill #4

## 2017-02-09 ENCOUNTER — Ambulatory Visit (INDEPENDENT_AMBULATORY_CARE_PROVIDER_SITE_OTHER): Payer: Medicaid Other | Admitting: Pulmonary Disease

## 2017-02-09 DIAGNOSIS — R0609 Other forms of dyspnea: Secondary | ICD-10-CM | POA: Diagnosis not present

## 2017-02-09 NOTE — Progress Notes (Signed)
PFT done today. 

## 2017-02-10 ENCOUNTER — Telehealth: Payer: Self-pay

## 2017-02-10 NOTE — Telephone Encounter (Signed)
CMA call regarding lab results   Patient Verify DOB   Patient was aware and understood  

## 2017-02-10 NOTE — Telephone Encounter (Signed)
-----   Message from Alfonse Spruce, [place] sent at 02/10/2017 12:54 PM EDT ----- Normal respiratory flora. No bacteria present.

## 2017-02-15 MED FILL — LOSARTAN-HCTZ 100-25 MG TAB: 100-25 | 30 days supply | Qty: 30 | Fill #4

## 2017-02-16 ENCOUNTER — Ambulatory Visit (INDEPENDENT_AMBULATORY_CARE_PROVIDER_SITE_OTHER): Payer: Medicaid Other | Admitting: *Deleted

## 2017-02-16 ENCOUNTER — Other Ambulatory Visit: Payer: Medicaid Other

## 2017-02-16 ENCOUNTER — Ambulatory Visit (INDEPENDENT_AMBULATORY_CARE_PROVIDER_SITE_OTHER): Payer: Medicaid Other | Admitting: Pulmonary Disease

## 2017-02-16 ENCOUNTER — Telehealth: Payer: Self-pay | Admitting: Pulmonary Disease

## 2017-02-16 ENCOUNTER — Encounter: Payer: Self-pay | Admitting: Pulmonary Disease

## 2017-02-16 VITALS — BP 146/78 | HR 95 | Ht 68.0 in | Wt 300.6 lb

## 2017-02-16 DIAGNOSIS — K219 Gastro-esophageal reflux disease without esophagitis: Secondary | ICD-10-CM

## 2017-02-16 DIAGNOSIS — R042 Hemoptysis: Secondary | ICD-10-CM | POA: Diagnosis not present

## 2017-02-16 DIAGNOSIS — J309 Allergic rhinitis, unspecified: Secondary | ICD-10-CM | POA: Diagnosis not present

## 2017-02-16 DIAGNOSIS — R0609 Other forms of dyspnea: Secondary | ICD-10-CM | POA: Diagnosis not present

## 2017-02-16 MED ORDER — MONTELUKAST SODIUM 10 MG PO TABS: 10.0000 mg | ORAL_TABLET | Freq: Every day | ORAL | 3 refills | Status: DC

## 2017-02-16 MED FILL — MONTELUKAST SOD 10 MG TAB: 10 | 30 days supply | Qty: 30 | Fill #0

## 2017-02-16 NOTE — Patient Instructions (Signed)
   We are going to schedule your bronchoscopy for the morning of May 29. Remember your husband will need to drive you because of the sedation.  Call me if you have any worsening in your cough or bloody mucus.  I'm starting you on an allergy medicine that can help with asthma as well.  TESTS ORDERED: 1. Serum RAST Panel 2. Methacholine Challenge Test

## 2017-02-16 NOTE — Progress Notes (Signed)
SIX MIN WALK 02/16/2017  Medications Tylenol, norvasc, allegra, neurontin, hydralazine, synthroid, hyzaar, glucophage, robaxin, MVI, protonix, ultram  Supplimental Oxygen during Test? (L/min) No  Laps 2  Partial Lap (in Meters) 30  Baseline BP (sitting) 140/72  Baseline Heartrate 95  Baseline Dyspnea (Borg Scale) 99  Baseline Fatigue (Borg Scale) 5  Baseline SPO2 3  BP (sitting) 162/78  Heartrate 121  Dyspnea (Borg Scale) 6  Fatigue (Borg Scale) 4  SPO2 99  BP (sitting) 138/70  Heartrate 94  SPO2 99  Stopped or Paused before Six Minutes No  Interpretation Hip pain;Leg pain  Distance Completed 126  Tech Comments: walked with a cane

## 2017-02-16 NOTE — Progress Notes (Signed)
Subjective:    Patient ID: Robin [name], female    DOB: Aug 08, 1956, 61 y.o.   MRN: [id number]  C.C.:  Follow-up for Dyspnea on Exertion, Hemoptysis, Chronic Allergic Rhinitis, & GERD.  HPI Dyspnea on Exertion:  She reports she has variable dyspnea. She feels it takes her a long time to recover after exertion. She has had intermittent cough & wheezing with exertion that may be slightly worse. She reports she uses her Albuterol inhaler sparingly. She reports it does seem to help her wheezing somewhat.   Hemoptysis:  She reports only minimal blood tinged mucus, primarily in the morning first thing. She denies any epistaxis. She does feel the volume of bloody mucus is decreasing. Remotely seen ENT.   Chronic Allergic Rhinitis:  Patient previously on Rhinocort/Flonase, Allegra/Claritin, & Atrovent nasal spray. She reports continued sinus congestion, pressure & post-nasal drainage.   GERD:  Previously on Dexilant & following with GI. Patient does have a known history of esophageal dysphagia with Schatzki's ring. She reports she still has reflux. Recently she was changed over to Protonix & Zantac.   Review of Systems No chest pain or pressure. She has had frequent sweats. No fever or chills. No rashes or bruising.   No Known Allergies  Current Outpatient Prescriptions on File Prior to Visit  Medication Sig Dispense Refill  . acetaminophen (TYLENOL) 500 MG tablet Take 1 tablet (500 mg total) by mouth every 6 (six) hours as needed for headache. 30 tablet 0  . Albuterol Sulfate (PROAIR RESPICLICK) 599 (90 Base) MCG/ACT AEPB Inhale 2 puffs into the lungs as needed. 1 each 1  . amitriptyline (ELAVIL) 50 MG tablet Take 1 tablet (50 mg total) by mouth at bedtime. 60 tablet 3  . amLODipine (NORVASC) 10 MG tablet Take 1 tablet (10 mg total) by mouth daily. 90 tablet 3  . aspirin 81 MG tablet Take 1 tablet (81 mg total) by mouth at bedtime. Reported on 12/09/2015 90 tablet 3  . buPROPion (WELLBUTRIN XL)  300 MG 24 hr tablet Take 1 tablet (300 mg total) by mouth every morning. Reported on 12/09/2015 90 tablet 3  . citalopram (CELEXA) 20 MG tablet Take 1 tablet (20 mg total) by mouth daily. 60 tablet 2  . diclofenac sodium (VOLTAREN) 1 % GEL Apply 2 g topically 4 (four) times daily. 100 g 2  . ferrous sulfate 325 (65 FE) MG tablet TAKE 1 TABLET BY MOUTH 2 TIMES DAILY WITH A MEAL. 60 tablet 0  . fexofenadine (ALLEGRA) 180 MG tablet Take 1 tablet (180 mg total) by mouth daily. Reported on 12/09/2015 30 tablet 3  . fluticasone (FLONASE) 50 MCG/ACT nasal spray Place 2 sprays into both nostrils daily. 16 g 6  . gabapentin (NEURONTIN) 300 MG capsule Take 3 capsules (900 mg total) by mouth 3 (three) times daily. 180 capsule 2  . guaiFENesin-codeine (ROBITUSSIN AC) 100-10 MG/5ML syrup Take 10 mLs by mouth 4 (four) times daily as needed for cough. 180 mL 0  . hydrALAZINE (APRESOLINE) 25 MG tablet Take 1 tablet (25 mg total) by mouth 3 (three) times daily. 90 tablet 3  . ipratropium (ATROVENT) 0.06 % nasal spray Place 2 sprays into both nostrils 4 (four) times daily. 15 mL 1  . levothyroxine (SYNTHROID, LEVOTHROID) 88 MCG tablet Take 1 tablet (88 mcg total) by mouth daily. 90 tablet 2  . linaclotide (LINZESS) 290 MCG CAPS capsule Take 1 capsule (290 mcg total) by mouth daily before breakfast. 30 capsule 5  .  loratadine (CLARITIN) 10 MG tablet Take 1 tablet (10 mg total) by mouth daily. 30 tablet 11  . losartan-hydrochlorothiazide (HYZAAR) 100-25 MG tablet Take 1 tablet by mouth daily. Reported on 12/09/2015 90 tablet 2  . metFORMIN (GLUCOPHAGE) 500 MG tablet Take 0.5 tablets (250 mg total) by mouth daily with breakfast. 45 tablet 3  . methocarbamol (ROBAXIN) 500 MG tablet Take 1 tablet (500 mg total) by mouth every 8 (eight) hours as needed for muscle spasms. 60 tablet 1  . Multiple Vitamin (MULTIVITAMIN) tablet Take 1 tablet by mouth daily. Reported on 12/09/2015 90 tablet 1  . naproxen (NAPROSYN) 500 MG tablet Take 1  tablet (500 mg total) by mouth 2 (two) times daily with a meal. 30 tablet 0  . Omega-3 Fatty Acids (FISH OIL PO) Take 1 capsule by mouth at bedtime. Reported on 12/09/2015    . pantoprazole (PROTONIX) 40 MG tablet Take 1 tablet (40 mg total) by mouth 2 (two) times daily before a meal. 60 tablet 5  . polyethylene glycol powder (GLYCOLAX/MIRALAX) powder Take 17 g by mouth 2 (two) times daily as needed. 3350 g 1  . ranitidine (ZANTAC) 150 MG tablet TAKE 1 TABLET BY MOUTH AT BEDTIME. 30 tablet 5  . traMADol (ULTRAM) 50 MG tablet TAKE 1 TABLET BY MOUTH EVERY 6 HOURS 60 tablet 0  . benzonatate (TESSALON) 100 MG capsule Take 2 capsules (200 mg total) by mouth 3 (three) times daily as needed for cough. (Patient not taking: Reported on 02/16/2017) 21 capsule 0   No current facility-administered medications on file prior to visit.     Past Medical History:  Diagnosis Date  . Anxiety disorder   . Arthritis   . Chronic neck pain   . Constipation   . Diabetes mellitus without complication ([place])   . GERD (gastroesophageal reflux disease)   . Hiatal hernia    small  . Hyperlipidemia   . Hypertension   . Schatzki's ring    non critical  . Sleep apnea    CPAP, Sleep study at [facility]    Past Surgical History:  Procedure Laterality Date  . ABDOMINAL HYSTERECTOMY    . ABDOMINAL HYSTERECTOMY  02/18/2000  . CARDIAC CATHETERIZATION  08/17/2001   normal L main/LAD/L Cfx/RCA (Dr. Adora Fridge)  . COLONOSCOPY  2006   Dr. [name], hyperplastic polyps  . COLONOSCOPY  08/06/11   abnormal terminal ileum for 10cm, erosions, geographical ulceration. Bx small bowel mucosa with prominent intramucosal lymphoid aggregates, slightly inflammed  . DIRECT LARYNGOSCOPY  03/15/2012   Procedure: DIRECT LARYNGOSCOPY;  Surgeon: [name], MD;  Location: [facility];  Service: ENT;  Laterality: N/A; Dr. Wolicki--:>no foreign body seen. normal esophagus to 40cm  . ESOPHAGEAL DILATION  04/17/2015   Procedure:  ESOPHAGEAL DILATION;  Surgeon: [name], MD;  [facility];  Service: Endoscopy;;  . ESOPHAGOGASTRODUODENOSCOPY  08/23/2008   EZM:OQHU distal esophageal erosions consistent with mild erosive reflux esophagitis, otherwise unremarkable esophagus/ Tiny antral erosions of doubtful clinical significance, otherwise normal stomach, patent pylorus, normal D1 and D2  . ESOPHAGOGASTRODUODENOSCOPY  08/06/11   small hh, noncritical Schatzki's ring s/p 19 F  . ESOPHAGOGASTRODUODENOSCOPY N/A 04/17/2015   TML:YYTKPT s/p dilation  . ESOPHAGOSCOPY  03/15/2012   Procedure: ESOPHAGOSCOPY;  Surgeon: [name], MD;  Location: [facility];  Service: ENT;  Laterality: N/A;  . KNEE ARTHROSCOPY  04/27/2001  . NM MYOCAR PERF WALL MOTION  2003   negative bruce protocol exercise tress  test; EF 68%; intermediate risk study due to evidence of anterior wall ischemia extending from mid-ventricle to apex  . 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 RECTOCELE REPAIR    . TRANSTHORACIC ECHOCARDIOGRAM  2003   EF normal  . VAGINAL PROLAPSE REPAIR      Family History  Problem Relation Age of Onset  . Kidney cancer Father   . Hypertension Father   . Hyperlipidemia Father   . Diabetes Mother   . Hypertension Mother   . Heart Problems Mother   . Hyperlipidemia Mother   . Asthma Mother   . Liver disease Brother   . Arthritis Brother   . Hypertension Sister   . Allergic rhinitis Sister   . Stomach cancer Unknown        aunt  . Breast cancer Maternal Grandmother   . Kidney disease Maternal Grandfather   . Breast cancer Paternal Grandmother   . Hypertension Brother   . Hyperlipidemia Brother   . Hypertension Brother   . Hypertension Sister   . Hyperlipidemia Sister   . Lupus Maternal Aunt   . Colon cancer Neg Hx   . Eczema Neg Hx   . Immunodeficiency Neg Hx   . Urticaria Neg Hx     Social History   Social History  . Marital status: Married    Spouse name: N/A  . Number of  children: 4  . Years of education: 10   Occupational History  .  Unemployed   Social History Main Topics  . Smoking status: Former Smoker    Packs/day: 0.50    Years: 10.00    Types: Cigarettes    Start date: 07/14/1975    Quit date: 04/04/1993  . Smokeless tobacco: Never Used     Comment: 17 yrs ago  . Alcohol use No  . Drug use: No  . Sexual activity: Not Asked   Other Topics Concern  . None   Social History Narrative   Lives with husband in an apartment on the first floor.  Has 4 children.     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  Trying to get disability.  Formerly worked as a Teacher, early years/pre.  Education: 11th grade.      [facility] (12/23/16):   Originally from [facility]. She was raised in [place]. 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 No pets currently. No bird exposure. She does have mold in her current home in the bathroom, laundry room, & master bedroom.       Objective:   Physical Exam BP (!) 146/78 (BP Location: Left Arm, Patient Position: Sitting, Cuff Size: Large)   Pulse 95   Ht 5\' 8"  (1.727 m)   Wt (!) 300 lb 9.6 oz (136.4 kg)   SpO2 97%   BMI 45.71 kg/m   Gen.: Morbidly obese. No distress. Comfortable. Integument: No rash. No bruising. Warm. HEENT: Mallampati class I. Moderate bilateral nasal turbinate swelling. Moist mucous membranes. Cardiovascular: Regular rate. Regular rhythm. Unable to appreciate JVD. Abdomen: Soft. Protuberant. Normal bowel sounds. Pulmonary: Good aeration bilaterally. Clear with auscultation. No accessory muscle use on room air. Neurological: Cranial nerves grossly intact. No meningismus. Alert and oriented 4.  PFT 02/09/17: FVC  2.35 L (77%) FEV1 2.06 L (85%) FEV1/FVC 0.88 FEF  25-75 3.32 L (142%) negative bronchodilator response TLC 3.74 L (68%) RV 71% ERV 28% DLCO  corrected 71% 08/22/15: FVC 2.32 L (119%) FEV1 1.99 L (121%) FEV1/FVC 0.85 FEF 25-75 2.59 L (118%)  6MWT 02/16/17:  Walked 126 meters / Baseline Sat 99%  On RA / Nadir Sat 99% on RA  IMAGING CT CHEST W/O 01/07/17 (personally reviewed by me):  No parenchymal nodule, mass, or opacity appreciated. No pleural effusion or thickening. No pericardial effusion. No pathologic mediastinal adenopathy.  CTA CHEST 12/13/15 (previously reviewed by me): No pulmonary emboli. No pleural effusion or thickening. No pericardial effusion. No pathologic mediastinal adenopathy. No parenchymal nodule, mass, or opacification.  CARDIAC TTE (09/29/16): LV normal in size with moderate concentric hypertrophy. EF 55-60% with no regional wall motion abnormalities. Indeterminant diastolic function. LA & RA normal in size. RV normal in size and function. No aortic stenosis or regurgitation. Aortic root normal in size. No mitral stenosis or regurgitation. No significant pulmonic regurgitation. No significant tricuspid regurgitation. No pericardial effusion.  LEFT HEART CATHETERIZATION (11/13/3):  Aortic systolic pressure 841  Diastolic pressure 90  Left ventricular systolic pressure 660  End-diastolic pressure 27 left main coronary artery normal  Left anterior descending artery normal  Left circumflex coronary artery normal  Right coronary artery dominant and normal   Overall estimated ejection fraction greater than 60% without focal wall motion abnormality  LABS 12/23/16 INR: 1.0 PTT: 24.1 Chromatin Ab:  <0.2 Smith Ab: <0.2 DS DNA Ab:  <1 SSA:  <0.2 SSB:  <0.2 Anti-CCP:  <16 SCL-70:  <0.2 ENA RNP Ab: Centromere Ab Screen:  <0.2 Jo-1 Ab:  <0.2  05/12/16 ANA: Negative Rheumatoid factor:  <10    Assessment & Plan:  61 y.o. female with long history of dyspnea on exertion, hemoptysis, chronic allergic rhinitis, & GERD. Patient's ongoing dyspnea on exertion, cough, and wheezing could be due to underlying  asthma. Her spirometry has worsened significantly but still shows no significant bronchodilator response. Contributing to the patient's dyspnea is her mild restrictive defect on lung volumes likely due to her obesity. Her carbon monoxide diffusion capacity is normal and reflected in her normal saturation despite decreased walk test distance which is likely multifactorial. The ongoing hemoptysis is of concern and I feel warrants close evaluation/airway inspection with bronchoscopy. We did discuss continuing to monitor for further resolution versus ENT evaluation but the patient wishes to proceed with bronchoscopy. I did discuss the risks of the procedure including bleeding, infection, pneumothorax, medication allergy, vocal cord injury, and potentially death. The patient is aware that she will need to bring her husband with her to the appointment.  1. Dyspnea on exertion:  Suspect underlying asthma but likely multifactorial in etiology. Checking methacholine challenge test. 2. Hemoptysis:  Plan for bronchoscopy with airway inspection on the morning of 5/29. 3. Chronic allergic rhinitis:  Continuing on current regimen of intranasal medications along with Allegra. Starting Singulair 10 mg daily daily at bedtime. Checking RAST panel. 4. GERD: Continuing Protonix and Zantac. Following with GI. 5. Health maintenance: Status post Tdap November 2017, Pneumovax November 2017 & influenza vaccine November 2017. 6. Follow-up: Return to clinic in  4 weeks or sooner if needed.  Sonia Baller [name], M.D. [facility] Pager:  [phone] After 3pm or if no response, call [phone] 10:27 AM 02/16/17

## 2017-02-16 NOTE — Telephone Encounter (Signed)
Message created in error

## 2017-02-17 LAB — RESPIRATORY ALLERGY PROFILE REGION II ~~LOC~~
Allergen, A. alternata, m6: <0.1 kU/L
Allergen, C. Herbarum, M2: <0.1 kU/L
Allergen, Cottonwood, t14: <0.1 kU/L
Allergen, D pternoyssinus,d7: 0.24 kU/L — ABNORMAL HIGH
Allergen, Mouse Urine Protein, e78: <0.1 kU/L
Allergen, Mulberry, t76: <0.1 kU/L
Allergen, P. notatum, m1: <0.1 kU/L
Aspergillus fumigatus, m3: <0.1 kU/L
Bermuda Grass: <0.1 kU/L
Box Elder IgE: <0.1 kU/L
Cockroach: 0.2 kU/L — ABNORMAL HIGH
Common Ragweed: <0.1 kU/L
D. farinae: 0.2 kU/L — ABNORMAL HIGH
Elm IgE: <0.1 kU/L
IGE (IMMUNOGLOBULIN E), SERUM: 81 kU/L (ref <115)
Johnson Grass: <0.1 kU/L
Pecan/Hickory Tree IgE: <0.1 kU/L
Rough Pigweed  IgE: <0.1 kU/L
Timothy Grass: <0.1 kU/L

## 2017-02-18 ENCOUNTER — Encounter: Payer: Self-pay | Admitting: Internal Medicine

## 2017-02-18 NOTE — Progress Notes (Signed)
Would optimize her bowel function, we switched her to linzess 290 daily, hopefully that is helpful.  Continue pantoprazole at least once daily.  Some of her LLQ pain may be secondary to the left hip joint. Really stress importance that she discuss inflammatory changes in her hips seen on ct with her orthopedic provider.  Dr. [name] reviewed CT and recommended following clinically regarding lymph nodes that are borderline enlarge. May not be significant. IF WORSENING ABD PAIN, WEIGHT LOSS, FEVER, ETC WOULD CONSIDER REPEAT IMAGING IN THE FUTURE.   Needs f/u ov with rmr in 3 months.

## 2017-02-19 ENCOUNTER — Encounter: Payer: Self-pay | Admitting: Internal Medicine

## 2017-02-22 ENCOUNTER — Encounter: Payer: Self-pay | Admitting: Internal Medicine

## 2017-02-22 ENCOUNTER — Telehealth: Payer: Self-pay | Admitting: Pulmonary Disease

## 2017-02-22 MED FILL — PANTOPRAZOLE SOD DR 40 MG T: 40 | 30 days supply | Qty: 60 | Fill #1

## 2017-02-22 MED FILL — AMITRIPTYLINE HCL 50 MG TAB: 50 | 30 days supply | Qty: 30 | Fill #2

## 2017-02-22 MED FILL — raNITIdine HCL 150 MG TABS: 150 | 30 days supply | Qty: 30 | Fill #1

## 2017-02-22 MED FILL — LINZESS 290 MCG CAPSULE: 290 | 30 days supply | Qty: 30 | Fill #1

## 2017-02-22 NOTE — Telephone Encounter (Signed)
Spoke with patient-she is unable keep appointment on 03-02-17 and needs [facility]. Pt was given number to Pulmonary Dept at hospital to [facility]. Nothing more needed at this time.

## 2017-02-23 ENCOUNTER — Telehealth: Payer: Self-pay | Admitting: Pulmonary Disease

## 2017-02-23 NOTE — Telephone Encounter (Signed)
I have called and spoke with pt and she stated that she cannot keep the appt for the bronch on 5/29.  I have called and cancelled this appt.  JN please advise when another time would you like this scheduled.  thanks

## 2017-02-23 NOTE — Telephone Encounter (Signed)
Not sure you might need to ck with dr [name] he usually schedules his bronch's I think

## 2017-02-23 NOTE — Telephone Encounter (Signed)
[place] pt needs to reschedule her bronch for 5/29.  Can you reschedule this or does triage need to do this.  Thanks

## 2017-02-24 ENCOUNTER — Ambulatory Visit (HOSPITAL_COMMUNITY)
Admission: RE | Admit: 2017-02-24 | Discharge: 2017-02-24 | Disposition: A | Discharge status: Home / Self Care | Payer: Medicaid Other | Source: Ambulatory Visit | Attending: Pulmonary Disease | Admitting: Pulmonary Disease

## 2017-02-24 DIAGNOSIS — R0609 Other forms of dyspnea: Secondary | ICD-10-CM | POA: Diagnosis not present

## 2017-02-24 LAB — PULMONARY FUNCTION TEST
FEF 25-75 POST: 3.07 L/s
FEF 25-75 PRE: 3.34 L/s
FEF2575-%Change-Post: -7 %
FEF2575-%Pred-Post: 126 %
FEF2575-%Pred-Pre: 137 %
FEV1-%Change-Post: -3 %
FEV1-%PRED-POST: 78 %
FEV1-%PRED-PRE: 81 %
FEV1-POST: 1.97 L
FEV1-PRE: 2.04 L
FEV1FVC-%Change-Post: -3 %
FEV1FVC-%Pred-Pre: 112 %
FEV6-%CHANGE-POST: 0 %
FEV6-%PRED-POST: 74 %
FEV6-%PRED-PRE: 74 %
FEV6-Post: 2.28 L
FEV6-Pre: 2.27 L
FEV6FVC-%PRED-POST: 103 %
FEV6FVC-%PRED-PRE: 103 %
FVC-%Change-Post: 0 %
FVC-%Pred-Post: 72 %
FVC-%Pred-Pre: 71 %
FVC-POST: 2.28 L
FVC-Pre: 2.27 L
POST FEV6/FVC RATIO: 100 %
PRE FEV1/FVC RATIO: 90 %
Post FEV1/FVC ratio: 86 %
Pre FEV6/FVC Ratio: 100 %

## 2017-02-24 MED ORDER — SODIUM CHLORIDE 0.9 % IN NEBU
3.0000 mL | INHALATION_SOLUTION | Freq: Once | RESPIRATORY_TRACT | Status: AC
  Administered 2017-02-24: 3 mL via RESPIRATORY_TRACT
  Filled 2017-02-24: qty 3

## 2017-02-24 MED ORDER — METHACHOLINE 0.25 MG/ML NEB SOLN
2.0000 mL | Freq: Once | RESPIRATORY_TRACT | Status: AC
  Administered 2017-02-24: 0.5 mg via RESPIRATORY_TRACT
  Filled 2017-02-24: qty 2

## 2017-02-24 MED ORDER — METHACHOLINE 0.0625 MG/ML NEB SOLN
2.0000 mL | Freq: Once | RESPIRATORY_TRACT | Status: AC
  Administered 2017-02-24: 0.125 mg via RESPIRATORY_TRACT
  Filled 2017-02-24: qty 2

## 2017-02-24 MED ORDER — METHACHOLINE 4 MG/ML NEB SOLN
2.0000 mL | Freq: Once | RESPIRATORY_TRACT | Status: AC
Start: 2017-02-24 — End: 2017-02-24
  Administered 2017-02-24: 8 mg via RESPIRATORY_TRACT
  Filled 2017-02-24: qty 2

## 2017-02-24 MED ORDER — METHACHOLINE 1 MG/ML NEB SOLN
2.0000 mL | Freq: Once | RESPIRATORY_TRACT | Status: AC
Start: 2017-02-24 — End: 2017-02-24
  Administered 2017-02-24: 2 mg via RESPIRATORY_TRACT
  Filled 2017-02-24: qty 2

## 2017-02-24 MED ORDER — ALBUTEROL SULFATE (2.5 MG/3ML) 0.083% IN NEBU
2.5000 mg | INHALATION_SOLUTION | Freq: Once | RESPIRATORY_TRACT | Status: AC
Start: 2017-02-24 — End: 2017-02-24
  Administered 2017-02-24: 2.5 mg via RESPIRATORY_TRACT

## 2017-02-24 MED ORDER — METHACHOLINE 16 MG/ML NEB SOLN
2.0000 mL | Freq: Once | RESPIRATORY_TRACT | Status: AC
  Administered 2017-02-24: 32 mg via RESPIRATORY_TRACT
  Filled 2017-02-24: qty 2

## 2017-02-24 NOTE — Progress Notes (Signed)
ON RECALL  °

## 2017-02-25 NOTE — Telephone Encounter (Signed)
JN please advise. Thanks. 

## 2017-02-26 NOTE — Telephone Encounter (Signed)
JN please advise on the Bronch---and when you would like this scheduled. thanks

## 2017-03-02 ENCOUNTER — Other Ambulatory Visit: Payer: Self-pay | Admitting: Internal Medicine

## 2017-03-02 ENCOUNTER — Encounter (HOSPITAL_COMMUNITY): Payer: Self-pay

## 2017-03-02 ENCOUNTER — Ambulatory Visit (HOSPITAL_COMMUNITY): Admit: 2017-03-02 | Payer: Medicaid Other | Admitting: Pulmonary Disease

## 2017-03-02 ENCOUNTER — Encounter (HOSPITAL_COMMUNITY): Payer: Medicaid Other

## 2017-03-02 SURGERY — VIDEO BRONCHOSCOPY WITHOUT FLUORO
Anesthesia: Moderate Sedation | Laterality: Bilateral

## 2017-03-02 MED FILL — metFORMIN HCL 500 MG TABS: 500 | 30 days supply | Qty: 15 | Fill #2

## 2017-03-02 NOTE — Telephone Encounter (Signed)
Spoke with Stanton Kidney with resp therapy, bronch has been rescheduled for 03/08/17 @ 1:00 @ [place] [facility]. Pt is aware and voiced her understanding.  Will route to JN as a FYI.

## 2017-03-02 NOTE — Telephone Encounter (Signed)
I have called respiratory to schedule this bronch for JN---will await a call back from respiratory

## 2017-03-02 NOTE — Telephone Encounter (Signed)
Calling back to sched bronch says sec. Is out and that it is ok to leave msg.Hillery Hunter

## 2017-03-02 NOTE — Telephone Encounter (Signed)
Trying to resch bronch, Clinical cytogeneticist is out.  When you call back please list of dates and campus that Dr. [name] would like to do this.  States she won't be at phone and asked to leave message or email her [email or web address] [phone].

## 2017-03-02 NOTE — Telephone Encounter (Signed)
I could do it Wednesday or Thursday morning of this week at [facility] between 8 & 12 or Monday afternoon after 1pm at [facility]. Just let me know.

## 2017-03-02 NOTE — Telephone Encounter (Signed)
I have called and lmomtcb for respiratory therapy to call back to reschedule the bronch for the pt.

## 2017-03-03 ENCOUNTER — Other Ambulatory Visit: Payer: Self-pay | Admitting: Internal Medicine

## 2017-03-03 MED FILL — traMADol HCL 50 MG TABS: 50 | 15 days supply | Qty: 60 | Fill #0

## 2017-03-03 MED FILL — GABAPENTIN 300 MG CAPSULE: 300 | 30 days supply | Qty: 120 | Fill #2

## 2017-03-03 MED FILL — AMLODIPINE BESYLATE 10 MG T: 10 | 30 days supply | Qty: 30 | Fill #5

## 2017-03-03 MED FILL — hydrALAZINE HCL 25 MG TABS: 25 | 30 days supply | Qty: 90 | Fill #2

## 2017-03-05 ENCOUNTER — Telehealth: Payer: Self-pay | Admitting: Pulmonary Disease

## 2017-03-05 NOTE — Telephone Encounter (Signed)
atc Renee from respiratory, number provided was a [employer] mobile phone with no option for an extension to be answered.. Will await call back.

## 2017-03-08 ENCOUNTER — Encounter (HOSPITAL_COMMUNITY)
Admission: RE | Disposition: A | Discharge status: Home / Self Care | Payer: Self-pay | Source: Ambulatory Visit | Attending: Pulmonary Disease

## 2017-03-08 ENCOUNTER — Ambulatory Visit (HOSPITAL_COMMUNITY)
Admission: RE | Admit: 2017-03-08 | Discharge: 2017-03-08 | Disposition: A | Discharge status: Home / Self Care | Payer: Medicaid Other | Source: Ambulatory Visit | Attending: Pulmonary Disease | Admitting: Pulmonary Disease

## 2017-03-08 ENCOUNTER — Encounter (HOSPITAL_COMMUNITY): Payer: Self-pay | Admitting: Respiratory Therapy

## 2017-03-08 DIAGNOSIS — Z7952 Long term (current) use of systemic steroids: Secondary | ICD-10-CM | POA: Diagnosis not present

## 2017-03-08 DIAGNOSIS — K219 Gastro-esophageal reflux disease without esophagitis: Secondary | ICD-10-CM | POA: Insufficient documentation

## 2017-03-08 DIAGNOSIS — Z87891 Personal history of nicotine dependence: Secondary | ICD-10-CM | POA: Insufficient documentation

## 2017-03-08 DIAGNOSIS — Z7984 Long term (current) use of oral hypoglycemic drugs: Secondary | ICD-10-CM | POA: Insufficient documentation

## 2017-03-08 DIAGNOSIS — F419 Anxiety disorder, unspecified: Secondary | ICD-10-CM | POA: Diagnosis not present

## 2017-03-08 DIAGNOSIS — E119 Type 2 diabetes mellitus without complications: Secondary | ICD-10-CM | POA: Insufficient documentation

## 2017-03-08 DIAGNOSIS — M1990 Unspecified osteoarthritis, unspecified site: Secondary | ICD-10-CM | POA: Insufficient documentation

## 2017-03-08 DIAGNOSIS — R042 Hemoptysis: Secondary | ICD-10-CM | POA: Diagnosis not present

## 2017-03-08 DIAGNOSIS — E785 Hyperlipidemia, unspecified: Secondary | ICD-10-CM | POA: Insufficient documentation

## 2017-03-08 DIAGNOSIS — R0609 Other forms of dyspnea: Secondary | ICD-10-CM | POA: Diagnosis not present

## 2017-03-08 DIAGNOSIS — J309 Allergic rhinitis, unspecified: Secondary | ICD-10-CM | POA: Insufficient documentation

## 2017-03-08 DIAGNOSIS — Z79899 Other long term (current) drug therapy: Secondary | ICD-10-CM | POA: Diagnosis not present

## 2017-03-08 DIAGNOSIS — Z791 Long term (current) use of non-steroidal anti-inflammatories (NSAID): Secondary | ICD-10-CM | POA: Diagnosis not present

## 2017-03-08 DIAGNOSIS — I10 Essential (primary) hypertension: Secondary | ICD-10-CM | POA: Diagnosis not present

## 2017-03-08 DIAGNOSIS — Z7982 Long term (current) use of aspirin: Secondary | ICD-10-CM | POA: Insufficient documentation

## 2017-03-08 DIAGNOSIS — G4730 Sleep apnea, unspecified: Secondary | ICD-10-CM | POA: Diagnosis not present

## 2017-03-08 HISTORY — PX: VIDEO BRONCHOSCOPY: SHX5072

## 2017-03-08 SURGERY — VIDEO BRONCHOSCOPY WITHOUT FLUORO
Anesthesia: Moderate Sedation | Laterality: Bilateral

## 2017-03-08 MED ORDER — FENTANYL CITRATE (PF) 100 MCG/2ML IJ SOLN
INTRAMUSCULAR | Status: AC
  Filled 2017-03-08: qty 4

## 2017-03-08 MED ORDER — MIDAZOLAM HCL 10 MG/2ML IJ SOLN
INTRAMUSCULAR | Status: DC | PRN
  Administered 2017-03-08: 2 mg via INTRAVENOUS
  Administered 2017-03-08: 1 mg via INTRAVENOUS

## 2017-03-08 MED ORDER — FENTANYL CITRATE (PF) 100 MCG/2ML IJ SOLN
INTRAMUSCULAR | Status: DC | PRN
  Administered 2017-03-08: 25 ug via INTRAVENOUS

## 2017-03-08 MED ORDER — SODIUM CHLORIDE 0.9 % IV SOLN
Freq: Once | INTRAVENOUS | Status: AC
  Administered 2017-03-08: 13:00:00 via INTRAVENOUS

## 2017-03-08 MED ORDER — MIDAZOLAM HCL 5 MG/ML IJ SOLN
INTRAMUSCULAR | Status: AC
  Filled 2017-03-08: qty 2

## 2017-03-08 MED ORDER — LIDOCAINE HCL 1 % IJ SOLN
INTRAMUSCULAR | Status: DC | PRN
  Administered 2017-03-08: 10 mL

## 2017-03-08 NOTE — Op Note (Signed)
Video Bronchoscopy Procedure Note  Pre-Procedure Diagnoses: 1.  Hemoptysis  Post-Procedure Diagnoses: 1.  Hemoptysis  Procedures Performed: 1. Bronchoscopy with Airway Inspection 2. Endobronchial Brushings Endotracheal  Consent:  Informed consent was obtained from the patient after discussing the risks and benefits of the procedure including bleeding, infection, pneumothorax, medication allergy, vocal chord injury, and potentially death.  Conscious Sedation:   Time of First Medication Administration:  1:45 pm Time of Last Medication Administration:  1:51 pm Time Physicial Left Room:  2:00 pm  Medications Administered During Conscious Sedation: 1. Lidocaine 1% gargle 10cc 2. Lidocaine 1% 12cc via bronchoscope 3. Versed 3 mg IV 4. Fentanyl 25 mcg IV  Description of Procedure: Patient was brought back to the endoscopy procedure room.  A time out was performed to identify the correct patient and procedure.  Lidocaine gargle was performed.  Patient was laid recumbent and conscious sedation was administered by respiratory therapy under my direction.  Bite block was inserted and towel was placed over the patient's eyes.  Flexible bronchoscope was then inserted into the posterior pharynx until vocal chords were in full view. There was no abnormality of the vocal chords or arytenoids.  Only a minimal amount of superficial abrasion was visualized on the left tonsil. No bleeding source or other mucosal abnormality was appreciated in the supraglottic retropharynx. A total of 6cc of Lidocaine was used to anesthetize the vocal chords.  The bronchoscope was then inserted between the vocal chords with ease into the proximal trachea.  Lidocaine was then used to anesthetize the patient's proximal airways.  An airway inspeciton was performed finding a small, superficial, erythematous ulceration was visualized at the 12:00 position in the immediately precarinal trachea. There appeared to be increased  submucosal vascularity. No bleeding was visualized. The remainder of the patient's airways were inspected and found to be without bleeding, mucosal abnormality, or lesion to explain the patient's hemoptysis. Endobronchial brushings were performed on the precarinal tracheal lesion.  The flexible bronchoscope was then removed from the patient's airways after suctioning of the remaining secretions. The bite block was removed and the patient was returned to the upright position.  Blood Loss:  None.  Complications:  None.  Endobronchial Brushings: 1. Performed on the Trachea:  Sent for Cytology & culture.  Post Procedure Instructions: Personally spoke with the patient's husband relaying the preliminary results of the procedure.  Instructed him that the patient is not to operate a car for 24 hours.  The patient should seek immediate medical attention if there is any persistent or progressive hemoptysis, difficulty breathing, or chest pain/discomfort.  The patient should also notify me immediately or seek medical attention for any purulent sputum production or fever occuring today or in the coming days.  The patient will be contacted by me once the final results of the studies are available.  The patient should call my office if they have any questions.

## 2017-03-08 NOTE — H&P (View-Only) (Signed)
Subjective:    Patient ID: Robin [name], female    DOB: 08-21-1956, 61 y.o.   MRN: [id number]  C.C.:  Follow-up for Dyspnea on Exertion, Hemoptysis, Chronic Allergic Rhinitis, & GERD.  HPI Dyspnea on Exertion:  She reports she has variable dyspnea. She feels it takes her a long time to recover after exertion. She has had intermittent cough & wheezing with exertion that may be slightly worse. She reports she uses her Albuterol inhaler sparingly. She reports it does seem to help her wheezing somewhat.   Hemoptysis:  She reports only minimal blood tinged mucus, primarily in the morning first thing. She denies any epistaxis. She does feel the volume of bloody mucus is decreasing. Remotely seen ENT.   Chronic Allergic Rhinitis:  Patient previously on Rhinocort/Flonase, Allegra/Claritin, & Atrovent nasal spray. She reports continued sinus congestion, pressure & post-nasal drainage.   GERD:  Previously on Dexilant & following with GI. Patient does have a known history of esophageal dysphagia with Schatzki's ring. She reports she still has reflux. Recently she was changed over to Protonix & Zantac.   Review of Systems No chest pain or pressure. She has had frequent sweats. No fever or chills. No rashes or bruising.   No Known Allergies  Current Outpatient Prescriptions on File Prior to Visit  Medication Sig Dispense Refill  . acetaminophen (TYLENOL) 500 MG tablet Take 1 tablet (500 mg total) by mouth every 6 (six) hours as needed for headache. 30 tablet 0  . Albuterol Sulfate (PROAIR RESPICLICK) 767 (90 Base) MCG/ACT AEPB Inhale 2 puffs into the lungs as needed. 1 each 1  . amitriptyline (ELAVIL) 50 MG tablet Take 1 tablet (50 mg total) by mouth at bedtime. 60 tablet 3  . amLODipine (NORVASC) 10 MG tablet Take 1 tablet (10 mg total) by mouth daily. 90 tablet 3  . aspirin 81 MG tablet Take 1 tablet (81 mg total) by mouth at bedtime. Reported on 12/09/2015 90 tablet 3  . buPROPion (WELLBUTRIN XL)  300 MG 24 hr tablet Take 1 tablet (300 mg total) by mouth every morning. Reported on 12/09/2015 90 tablet 3  . citalopram (CELEXA) 20 MG tablet Take 1 tablet (20 mg total) by mouth daily. 60 tablet 2  . diclofenac sodium (VOLTAREN) 1 % GEL Apply 2 g topically 4 (four) times daily. 100 g 2  . ferrous sulfate 325 (65 FE) MG tablet TAKE 1 TABLET BY MOUTH 2 TIMES DAILY WITH A MEAL. 60 tablet 0  . fexofenadine (ALLEGRA) 180 MG tablet Take 1 tablet (180 mg total) by mouth daily. Reported on 12/09/2015 30 tablet 3  . fluticasone (FLONASE) 50 MCG/ACT nasal spray Place 2 sprays into both nostrils daily. 16 g 6  . gabapentin (NEURONTIN) 300 MG capsule Take 3 capsules (900 mg total) by mouth 3 (three) times daily. 180 capsule 2  . guaiFENesin-codeine (ROBITUSSIN AC) 100-10 MG/5ML syrup Take 10 mLs by mouth 4 (four) times daily as needed for cough. 180 mL 0  . hydrALAZINE (APRESOLINE) 25 MG tablet Take 1 tablet (25 mg total) by mouth 3 (three) times daily. 90 tablet 3  . ipratropium (ATROVENT) 0.06 % nasal spray Place 2 sprays into both nostrils 4 (four) times daily. 15 mL 1  . levothyroxine (SYNTHROID, LEVOTHROID) 88 MCG tablet Take 1 tablet (88 mcg total) by mouth daily. 90 tablet 2  . linaclotide (LINZESS) 290 MCG CAPS capsule Take 1 capsule (290 mcg total) by mouth daily before breakfast. 30 capsule 5  .  loratadine (CLARITIN) 10 MG tablet Take 1 tablet (10 mg total) by mouth daily. 30 tablet 11  . losartan-hydrochlorothiazide (HYZAAR) 100-25 MG tablet Take 1 tablet by mouth daily. Reported on 12/09/2015 90 tablet 2  . metFORMIN (GLUCOPHAGE) 500 MG tablet Take 0.5 tablets (250 mg total) by mouth daily with breakfast. 45 tablet 3  . methocarbamol (ROBAXIN) 500 MG tablet Take 1 tablet (500 mg total) by mouth every 8 (eight) hours as needed for muscle spasms. 60 tablet 1  . Multiple Vitamin (MULTIVITAMIN) tablet Take 1 tablet by mouth daily. Reported on 12/09/2015 90 tablet 1  . naproxen (NAPROSYN) 500 MG tablet Take 1  tablet (500 mg total) by mouth 2 (two) times daily with a meal. 30 tablet 0  . Omega-3 Fatty Acids (FISH OIL PO) Take 1 capsule by mouth at bedtime. Reported on 12/09/2015    . pantoprazole (PROTONIX) 40 MG tablet Take 1 tablet (40 mg total) by mouth 2 (two) times daily before a meal. 60 tablet 5  . polyethylene glycol powder (GLYCOLAX/MIRALAX) powder Take 17 g by mouth 2 (two) times daily as needed. 3350 g 1  . ranitidine (ZANTAC) 150 MG tablet TAKE 1 TABLET BY MOUTH AT BEDTIME. 30 tablet 5  . traMADol (ULTRAM) 50 MG tablet TAKE 1 TABLET BY MOUTH EVERY 6 HOURS 60 tablet 0  . benzonatate (TESSALON) 100 MG capsule Take 2 capsules (200 mg total) by mouth 3 (three) times daily as needed for cough. (Patient not taking: Reported on 02/16/2017) 21 capsule 0   No current facility-administered medications on file prior to visit.     Past Medical History:  Diagnosis Date  . Anxiety disorder   . Arthritis   . Chronic neck pain   . Constipation   . Diabetes mellitus without complication ([place])   . GERD (gastroesophageal reflux disease)   . Hiatal hernia    small  . Hyperlipidemia   . Hypertension   . Schatzki's ring    non critical  . Sleep apnea    CPAP, Sleep study at [facility]    Past Surgical History:  Procedure Laterality Date  . ABDOMINAL HYSTERECTOMY    . ABDOMINAL HYSTERECTOMY  02/18/2000  . CARDIAC CATHETERIZATION  08/17/2001   normal L main/LAD/L Cfx/RCA (Dr. Adora Fridge)  . COLONOSCOPY  2006   Dr. [name], hyperplastic polyps  . COLONOSCOPY  08/06/11   abnormal terminal ileum for 10cm, erosions, geographical ulceration. Bx small bowel mucosa with prominent intramucosal lymphoid aggregates, slightly inflammed  . DIRECT LARYNGOSCOPY  03/15/2012   Procedure: DIRECT LARYNGOSCOPY;  Surgeon: [name], MD;  Location: [facility];  Service: ENT;  Laterality: N/A; Dr. Wolicki--:>no foreign body seen. normal esophagus to 40cm  . ESOPHAGEAL DILATION  04/17/2015   Procedure:  ESOPHAGEAL DILATION;  Surgeon: [name], MD;  [facility];  Service: Endoscopy;;  . ESOPHAGOGASTRODUODENOSCOPY  08/23/2008   TOI:ZTIW distal esophageal erosions consistent with mild erosive reflux esophagitis, otherwise unremarkable esophagus/ Tiny antral erosions of doubtful clinical significance, otherwise normal stomach, patent pylorus, normal D1 and D2  . ESOPHAGOGASTRODUODENOSCOPY  08/06/11   small hh, noncritical Schatzki's ring s/p 18 F  . ESOPHAGOGASTRODUODENOSCOPY N/A 04/17/2015   PYK:DXIPJA s/p dilation  . ESOPHAGOSCOPY  03/15/2012   Procedure: ESOPHAGOSCOPY;  Surgeon: [name], MD;  Location: [facility];  Service: ENT;  Laterality: N/A;  . KNEE ARTHROSCOPY  04/27/2001  . NM MYOCAR PERF WALL MOTION  2003   negative bruce protocol exercise tress  test; EF 68%; intermediate risk study due to evidence of anterior wall ischemia extending from mid-ventricle to apex  . 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 RECTOCELE REPAIR    . TRANSTHORACIC ECHOCARDIOGRAM  2003   EF normal  . VAGINAL PROLAPSE REPAIR      Family History  Problem Relation Age of Onset  . Kidney cancer Father   . Hypertension Father   . Hyperlipidemia Father   . Diabetes Mother   . Hypertension Mother   . Heart Problems Mother   . Hyperlipidemia Mother   . Asthma Mother   . Liver disease Brother   . Arthritis Brother   . Hypertension Sister   . Allergic rhinitis Sister   . Stomach cancer Unknown        aunt  . Breast cancer Maternal Grandmother   . Kidney disease Maternal Grandfather   . Breast cancer Paternal Grandmother   . Hypertension Brother   . Hyperlipidemia Brother   . Hypertension Brother   . Hypertension Sister   . Hyperlipidemia Sister   . Lupus Maternal Aunt   . Colon cancer Neg Hx   . Eczema Neg Hx   . Immunodeficiency Neg Hx   . Urticaria Neg Hx     Social History   Social History  . Marital status: Married    Spouse name: N/A  . Number of  children: 4  . Years of education: 10   Occupational History  .  Unemployed   Social History Main Topics  . Smoking status: Former Smoker    Packs/day: 0.50    Years: 10.00    Types: Cigarettes    Start date: 07/14/1975    Quit date: 04/04/1993  . Smokeless tobacco: Never Used     Comment: 17 yrs ago  . Alcohol use No  . Drug use: No  . Sexual activity: Not Asked   Other Topics Concern  . None   Social History Narrative   Lives with husband in an apartment on the first floor.  Has 4 children.     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  Trying to get disability.  Formerly worked as a Teacher, early years/pre.  Education: 11th grade.      [facility] (12/23/16):   Originally from [facility]. She was raised in [place]. 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 No pets currently. No bird exposure. She does have mold in her current home in the bathroom, laundry room, & master bedroom.       Objective:   Physical Exam BP (!) 146/78 (BP Location: Left Arm, Patient Position: Sitting, Cuff Size: Large)   Pulse 95   Ht 5\' 8"  (1.727 m)   Wt (!) 300 lb 9.6 oz (136.4 kg)   SpO2 97%   BMI 45.71 kg/m   Gen.: Morbidly obese. No distress. Comfortable. Integument: No rash. No bruising. Warm. HEENT: Mallampati class I. Moderate bilateral nasal turbinate swelling. Moist mucous membranes. Cardiovascular: Regular rate. Regular rhythm. Unable to appreciate JVD. Abdomen: Soft. Protuberant. Normal bowel sounds. Pulmonary: Good aeration bilaterally. Clear with auscultation. No accessory muscle use on room air. Neurological: Cranial nerves grossly intact. No meningismus. Alert and oriented 4.  PFT 02/09/17: FVC  2.35 L (77%) FEV1 2.06 L (85%) FEV1/FVC 0.88 FEF  25-75 3.32 L (142%) negative bronchodilator response TLC 3.74 L (68%) RV 71% ERV 28% DLCO  corrected 71% 08/22/15: FVC 2.32 L (119%) FEV1 1.99 L (121%) FEV1/FVC 0.85 FEF 25-75 2.59 L (118%)  6MWT 02/16/17:  Walked 126 meters / Baseline Sat 99%  On RA / Nadir Sat 99% on RA  IMAGING CT CHEST W/O 01/07/17 (personally reviewed by me):  No parenchymal nodule, mass, or opacity appreciated. No pleural effusion or thickening. No pericardial effusion. No pathologic mediastinal adenopathy.  CTA CHEST 12/13/15 (previously reviewed by me): No pulmonary emboli. No pleural effusion or thickening. No pericardial effusion. No pathologic mediastinal adenopathy. No parenchymal nodule, mass, or opacification.  CARDIAC TTE (09/29/16): LV normal in size with moderate concentric hypertrophy. EF 55-60% with no regional wall motion abnormalities. Indeterminant diastolic function. LA & RA normal in size. RV normal in size and function. No aortic stenosis or regurgitation. Aortic root normal in size. No mitral stenosis or regurgitation. No significant pulmonic regurgitation. No significant tricuspid regurgitation. No pericardial effusion.  LEFT HEART CATHETERIZATION (11/13/3):  Aortic systolic pressure 768  Diastolic pressure 90  Left ventricular systolic pressure 115  End-diastolic pressure 27 left main coronary artery normal  Left anterior descending artery normal  Left circumflex coronary artery normal  Right coronary artery dominant and normal   Overall estimated ejection fraction greater than 60% without focal wall motion abnormality  LABS 12/23/16 INR: 1.0 PTT: 24.1 Chromatin Ab:  <0.2 Smith Ab: <0.2 DS DNA Ab:  <1 SSA:  <0.2 SSB:  <0.2 Anti-CCP:  <16 SCL-70:  <0.2 ENA RNP Ab: Centromere Ab Screen:  <0.2 Jo-1 Ab:  <0.2  05/12/16 ANA: Negative Rheumatoid factor:  <10    Assessment & Plan:  61 y.o. female with long history of dyspnea on exertion, hemoptysis, chronic allergic rhinitis, & GERD. Patient's ongoing dyspnea on exertion, cough, and wheezing could be due to underlying  asthma. Her spirometry has worsened significantly but still shows no significant bronchodilator response. Contributing to the patient's dyspnea is her mild restrictive defect on lung volumes likely due to her obesity. Her carbon monoxide diffusion capacity is normal and reflected in her normal saturation despite decreased walk test distance which is likely multifactorial. The ongoing hemoptysis is of concern and I feel warrants close evaluation/airway inspection with bronchoscopy. We did discuss continuing to monitor for further resolution versus ENT evaluation but the patient wishes to proceed with bronchoscopy. I did discuss the risks of the procedure including bleeding, infection, pneumothorax, medication allergy, vocal cord injury, and potentially death. The patient is aware that she will need to bring her husband with her to the appointment.  1. Dyspnea on exertion:  Suspect underlying asthma but likely multifactorial in etiology. Checking methacholine challenge test. 2. Hemoptysis:  Plan for bronchoscopy with airway inspection on the morning of 5/29. 3. Chronic allergic rhinitis:  Continuing on current regimen of intranasal medications along with Allegra. Starting Singulair 10 mg daily daily at bedtime. Checking RAST panel. 4. GERD: Continuing Protonix and Zantac. Following with GI. 5. Health maintenance: Status post Tdap November 2017, Pneumovax November 2017 & influenza vaccine November 2017. 6. Follow-up: Return to clinic in  4 weeks or sooner if needed.  Sonia Baller [name], M.D. [facility] Pager:  [phone] After 3pm or if no response, call [phone] 10:27 AM 02/16/17

## 2017-03-08 NOTE — Progress Notes (Signed)
Video bronchoscopy performed.  Intervention bronchial brushing.  No complications noted.  Will continue to monitor. 

## 2017-03-08 NOTE — Telephone Encounter (Signed)
lmtcb for Robin [name].

## 2017-03-08 NOTE — Interval H&P Note (Signed)
History and Physical Interval Note:  03/08/2017 1:35 PM  [name]  has presented today for surgery, with the diagnosis of Hemoptysis  The various methods of treatment have been discussed with the patient and family. After consideration of risks, benefits and other options for treatment, the patient has consented to  Procedure(s): VIDEO BRONCHOSCOPY WITHOUT FLUORO (Bilateral) as a surgical intervention .  The patient's history has been reviewed, patient examined, no change in status, stable for surgery.  I have reviewed the patient's chart and labs.  Questions were answered to the patient's satisfaction.     Robin [name]

## 2017-03-08 NOTE — Discharge Instructions (Signed)
Flexible Bronchoscopy, Care After These instructions give you information on caring for yourself after your procedure. Your doctor may also give you more specific instructions. Call your doctor if you have any problems or questions after your procedure. Follow these instructions at home:  Do not eat or drink anything for 2 hours after your procedure. If you try to eat or drink before the medicine wears off, food or drink could go into your lungs. You could also burn yourself.  After 2 hours have passed and when you can cough and gag normally, you may eat soft food and drink liquids slowly.  The day after the test, you may eat your normal diet.  You may do your normal activities.  Keep all doctor visits. Get help right away if:  You get more and more short of breath.  You get light-headed.  You feel like you are going to pass out (faint).  You have chest pain.  You have new problems that worry you.  You cough up more than a little blood.  You cough up more blood than before. This information is not intended to replace advice given to you by your health care provider. Make sure you discuss any questions you have with your health care provider. Document Released: 07/19/2009 Document Revised: 02/27/2016 Document Reviewed: 05/26/2013 Elsevier Interactive Patient Education  2017 [place] not eat or drink anything until 4:00 pm on 03/08/2017.

## 2017-03-09 ENCOUNTER — Encounter (HOSPITAL_COMMUNITY): Payer: Self-pay | Admitting: Pulmonary Disease

## 2017-03-09 ENCOUNTER — Other Ambulatory Visit: Payer: Self-pay | Admitting: Internal Medicine

## 2017-03-09 MED FILL — METHOCARBAMOL 500 MG TABLET: 500 | 20 days supply | Qty: 60 | Fill #1

## 2017-03-09 MED FILL — ?BUPROPION HCL XL 300 MG TA: 300 | 30 days supply | Qty: 30 | Fill #0

## 2017-03-09 NOTE — Telephone Encounter (Addendum)
lmtcb for pt. Will sign off on phone note per triage protocol, due to 3 unsuccessful attempts to reach caller.

## 2017-03-10 LAB — CULTURE, RESPIRATORY W GRAM STAIN

## 2017-03-10 LAB — CULTURE, RESPIRATORY

## 2017-03-11 ENCOUNTER — Other Ambulatory Visit: Payer: Self-pay | Admitting: Pulmonary Disease

## 2017-03-11 LAB — PULMONARY FUNCTION TEST
DL/VA % PRED: 99 %
DL/VA: 5.11 ml/min/mmHg/L
DLCO COR % PRED: 71 %
DLCO UNC % PRED: 69 %
DLCO UNC: 19.69 ml/min/mmHg
DLCO cor: 20.27 ml/min/mmHg
FEF 25-75 POST: 2.66 L/s
FEF 25-75 Pre: 3.32 L/sec
FEF2575-%CHANGE-POST: -19 %
FEF2575-%PRED-POST: 113 %
FEF2575-%Pred-Pre: 142 %
FEV1-%Change-Post: -1 %
FEV1-%PRED-POST: 84 %
FEV1-%Pred-Pre: 85 %
FEV1-Post: 2.03 L
FEV1-Pre: 2.06 L
FEV1FVC-%CHANGE-POST: 0 %
FEV1FVC-%PRED-PRE: 109 %
FEV6-%Change-Post: -1 %
FEV6-%PRED-POST: 77 %
FEV6-%Pred-Pre: 78 %
FEV6-PRE: 2.32 L
FEV6-Post: 2.29 L
FEV6FVC-%CHANGE-POST: 1 %
FEV6FVC-%Pred-Post: 103 %
FEV6FVC-%Pred-Pre: 101 %
FVC-%Change-Post: -2 %
FVC-%Pred-Post: 75 %
FVC-%Pred-Pre: 77 %
FVC-PRE: 2.35 L
FVC-Post: 2.29 L
POST FEV1/FVC RATIO: 88 %
Post FEV6/FVC ratio: 100 %
Pre FEV1/FVC ratio: 88 %
Pre FEV6/FVC Ratio: 99 %
RV % pred: 71 %
RV: 1.51 L
TLC % pred: 68 %
TLC: 3.74 L

## 2017-03-11 MED ORDER — AZITHROMYCIN 250 MG PO TABS: ORAL_TABLET | ORAL | 0 refills | Status: DC

## 2017-03-11 MED FILL — LEVOTHYROXINE 88 MCG TABLET: 88 | 30 days supply | Qty: 30 | Fill #5

## 2017-03-11 MED FILL — AZITHROMYCIN 250 MG TABLET: 250 | 5 days supply | Qty: 6 | Fill #0

## 2017-03-11 NOTE — Progress Notes (Signed)
ATC, received message stating the patient is unavailable. Will try again at later time

## 2017-03-11 NOTE — Progress Notes (Signed)
Spoke with patient and informed her of results. She was informed about antibiotic being sent it to cover bug. She verbalized understanding and did not have any questions. Order placed for ZPak. Nothing further is needed.

## 2017-03-12 ENCOUNTER — Emergency Department (HOSPITAL_COMMUNITY)
Admission: EM | Admit: 2017-03-12 | Discharge: 2017-03-12 | Disposition: A | Discharge status: Home / Self Care | Payer: Medicaid Other | Attending: Emergency Medicine | Admitting: Emergency Medicine

## 2017-03-12 ENCOUNTER — Emergency Department (HOSPITAL_COMMUNITY): Payer: Medicaid Other

## 2017-03-12 ENCOUNTER — Encounter (HOSPITAL_COMMUNITY): Payer: Self-pay | Admitting: Neurology

## 2017-03-12 DIAGNOSIS — Z7984 Long term (current) use of oral hypoglycemic drugs: Secondary | ICD-10-CM | POA: Diagnosis not present

## 2017-03-12 DIAGNOSIS — F419 Anxiety disorder, unspecified: Secondary | ICD-10-CM | POA: Insufficient documentation

## 2017-03-12 DIAGNOSIS — Z87891 Personal history of nicotine dependence: Secondary | ICD-10-CM | POA: Diagnosis not present

## 2017-03-12 DIAGNOSIS — E119 Type 2 diabetes mellitus without complications: Secondary | ICD-10-CM | POA: Insufficient documentation

## 2017-03-12 DIAGNOSIS — Z7982 Long term (current) use of aspirin: Secondary | ICD-10-CM | POA: Diagnosis not present

## 2017-03-12 DIAGNOSIS — Z79899 Other long term (current) drug therapy: Secondary | ICD-10-CM | POA: Diagnosis not present

## 2017-03-12 DIAGNOSIS — R042 Hemoptysis: Secondary | ICD-10-CM

## 2017-03-12 DIAGNOSIS — I10 Essential (primary) hypertension: Secondary | ICD-10-CM | POA: Diagnosis not present

## 2017-03-12 DIAGNOSIS — E039 Hypothyroidism, unspecified: Secondary | ICD-10-CM | POA: Insufficient documentation

## 2017-03-12 DIAGNOSIS — E785 Hyperlipidemia, unspecified: Secondary | ICD-10-CM | POA: Diagnosis not present

## 2017-03-12 DIAGNOSIS — R0989 Other specified symptoms and signs involving the circulatory and respiratory systems: Secondary | ICD-10-CM | POA: Diagnosis not present

## 2017-03-12 DIAGNOSIS — G4733 Obstructive sleep apnea (adult) (pediatric): Secondary | ICD-10-CM | POA: Insufficient documentation

## 2017-03-12 MED ORDER — BENZONATATE 100 MG PO CAPS: 100.0000 mg | ORAL_CAPSULE | Freq: Three times a day (TID) | ORAL | 0 refills | Status: DC

## 2017-03-12 MED FILL — BENZONATATE 100 MG CAPSULE: 100 | 7 days supply | Qty: 21 | Fill #0

## 2017-03-12 NOTE — ED Provider Notes (Signed)
[facility] DEPT Provider Note   CSN: [id number] Arrival date & time: 03/12/17  0920     History   Chief Complaint Chief Complaint  Patient presents with  . Hemoptysis    HPI Robin [name] is a 61 y.o. female.  HPI One week ago patient was eating ribs and when swallowing she started to notice a pain in her right upper throat. She reports she believes a small rib bone got caught in her throat. Since that time she has been clearing and coughing up blood intermittently. She was particularly in the morning if she clears her throat there will be some blood-streaked mucus and maybe even a small clot. She however is not having difficulty eating or breathing. She has continued to have a normal diet but reports she does experience some discomfort with swallowing. No other associated symptoms. Patient reports she rather incidentally had a bronchoscopy done 4 days ago which was for other problems she has with breathing. She reports she never had hemoptysis before this incident was eating ribs. She reports on the bronchoscopy she was told they did not see any reason for her bleeding and that she did not have any bone stuck that was visualized Past Medical History:  Diagnosis Date  . Anxiety disorder   . Arthritis   . Chronic neck pain   . Constipation   . Diabetes mellitus without complication ([place])   . GERD (gastroesophageal reflux disease)   . Hiatal hernia    small  . Hyperlipidemia   . Hypertension   . Schatzki's ring    non critical  . Sleep apnea    CPAP, Sleep study at [facility]    Patient Active Problem List   Diagnosis Date Noted  . Diabetes mellitus without complication ([place]) 20/25/4270  . LLQ pain 01/19/2017  . Dyspnea on exertion 12/23/2016  . Chronic allergic rhinitis 12/23/2016  . Hemoptysis 12/23/2016  . Fibromyalgia 08/18/2016  . Chronic lymphocytic thyroiditis 08/18/2016  . Depression 04/01/2016  . OSA (obstructive sleep apnea) 04/01/2016  . HTN  (hypertension), benign 12/09/2015  . Arthritis 12/09/2015  . Hypothyroidism 12/09/2015  . Morbid obesity ([place]) 12/09/2015  . Constipation 05/27/2015  . Constipation 05/27/2015  . Fatty liver 05/27/2015  . Dysphagia, pharyngoesophageal phase   . Epigastric pain 07/04/2012  . Abdominal pain, generalized 09/18/2011  . Abnormal small bowel biopsy 09/18/2011  . Lactose intolerance 09/18/2011  . Anorexia 06/25/2011  . Dysuria 06/25/2011  . Bowel habit changes 06/25/2011  . GERD 01/21/2010  . DYSPHAGIA 01/21/2010    Past Surgical History:  Procedure Laterality Date  . ABDOMINAL HYSTERECTOMY    . ABDOMINAL HYSTERECTOMY  02/18/2000  . CARDIAC CATHETERIZATION  08/17/2001   normal L main/LAD/L Cfx/RCA (Dr. Adora Fridge)  . COLONOSCOPY  2006   Dr. [name], hyperplastic polyps  . COLONOSCOPY  08/06/11   abnormal terminal ileum for 10cm, erosions, geographical ulceration. Bx small bowel mucosa with prominent intramucosal lymphoid aggregates, slightly inflammed  . DIRECT LARYNGOSCOPY  03/15/2012   Procedure: DIRECT LARYNGOSCOPY;  Surgeon: [name], MD;  Location: [facility];  Service: ENT;  Laterality: N/A; Dr. Wolicki--:>no foreign body seen. normal esophagus to 40cm  . ESOPHAGEAL DILATION  04/17/2015   Procedure: ESOPHAGEAL DILATION;  Surgeon: [name], MD;  [facility];  Service: Endoscopy;;  . ESOPHAGOGASTRODUODENOSCOPY  08/23/2008   WCB:JSEG distal esophageal erosions consistent with mild erosive reflux esophagitis, otherwise unremarkable esophagus/ Tiny antral erosions of doubtful clinical significance, otherwise normal  stomach, patent pylorus, normal D1 and D2  . ESOPHAGOGASTRODUODENOSCOPY  08/06/11   small hh, noncritical Schatzki's ring s/p 101 F  . ESOPHAGOGASTRODUODENOSCOPY N/A 04/17/2015   PZW:CHENID s/p dilation  . ESOPHAGOSCOPY  03/15/2012   Procedure: ESOPHAGOSCOPY;  Surgeon: [name], MD;  Location: [facility];  Service: ENT;  Laterality: N/A;  . KNEE ARTHROSCOPY   04/27/2001  . NM MYOCAR PERF WALL MOTION  2003   negative bruce protocol exercise tress test; EF 68%; intermediate risk study due to evidence of anterior wall ischemia extending from mid-ventricle to apex  . 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 RECTOCELE REPAIR    . TRANSTHORACIC ECHOCARDIOGRAM  2003   EF normal  . VAGINAL PROLAPSE REPAIR    . VIDEO BRONCHOSCOPY Bilateral 03/08/2017   Procedure: VIDEO BRONCHOSCOPY WITHOUT FLUORO;  Surgeon: Javier Glazier, MD;  Location: [facility];  Service: Cardiopulmonary;  Laterality: Bilateral;    OB History    No data available       Home Medications    Prior to Admission medications   Medication Sig Start Date End Date Taking? Authorizing Provider  acetaminophen (TYLENOL) 500 MG tablet Take 1 tablet (500 mg total) by mouth every 6 (six) hours as needed for headache. 02/04/17   Alfonse Spruce, FNP  Albuterol Sulfate (PROAIR RESPICLICK) 782 (90 Base) MCG/ACT AEPB Inhale 2 puffs into the lungs as needed. 08/05/16   Maren Reamer, MD  amitriptyline (ELAVIL) 50 MG tablet Take 1 tablet (50 mg total) by mouth at bedtime. 09/22/16   Maren Reamer, MD  amLODipine (NORVASC) 10 MG tablet Take 1 tablet (10 mg total) by mouth daily. 09/22/16   Maren Reamer, MD  aspirin 81 MG tablet Take 1 tablet (81 mg total) by mouth at bedtime. Reported on 12/09/2015 09/22/16   Maren Reamer, MD  azithromycin (ZITHROMAX Z-PAK) 250 MG tablet Take two tablets today, then one tablet daily until gone 03/11/17   Javier Glazier, MD  benzonatate (TESSALON) 100 MG capsule Take 2 capsules (200 mg total) by mouth 3 (three) times daily as needed for cough. Patient not taking: Reported on 02/16/2017 10/06/16   Lysbeth Penner, FNP  buPROPion (WELLBUTRIN XL) 300 MG 24 hr tablet Take 1 tablet (300 mg total) by mouth every morning. Reported on 12/09/2015 09/22/16   Maren Reamer, MD  citalopram (CELEXA) 20 MG tablet Take 1 tablet (20 mg  total) by mouth daily. 08/05/16   Maren Reamer, MD  diclofenac sodium (VOLTAREN) 1 % GEL Apply 2 g topically 4 (four) times daily. 05/12/16   Lottie Mussel T, MD  ferrous sulfate 325 (65 FE) MG tablet TAKE 1 TABLET BY MOUTH 2 TIMES DAILY WITH A MEAL. 07/06/16   Langeland, Leda Quail, MD  fexofenadine (ALLEGRA) 180 MG tablet Take 1 tablet (180 mg total) by mouth daily. Reported on 12/09/2015 02/04/17   Alfonse Spruce, FNP  fluticasone (FLONASE) 50 MCG/ACT nasal spray Place 2 sprays into both nostrils daily. 02/04/17   Alfonse Spruce, FNP  gabapentin (NEURONTIN) 300 MG capsule Take 3 capsules (900 mg total) by mouth 3 (three) times daily. 12/31/16   Langeland, Dawn T, MD  guaiFENesin-codeine (ROBITUSSIN AC) 100-10 MG/5ML syrup Take 10 mLs by mouth 4 (four) times daily as needed for cough. 09/30/16   Billy Fischer, MD  hydrALAZINE (APRESOLINE) 25 MG tablet Take 1 tablet (25 mg total) by mouth 3 (three) times daily. 12/18/16  Langeland, Dawn T, MD  ipratropium (ATROVENT) 0.06 % nasal spray Place 2 sprays into both nostrils 4 (four) times daily. 09/30/16   Billy Fischer, MD  levothyroxine (SYNTHROID, LEVOTHROID) 88 MCG tablet Take 1 tablet (88 mcg total) by mouth daily. 09/22/16   Maren Reamer, MD  linaclotide [facility]) 290 MCG CAPS capsule Take 1 capsule (290 mcg total) by mouth daily before breakfast. 01/21/17   Mahala Menghini, PA-C  loratadine (CLARITIN) 10 MG tablet Take 1 tablet (10 mg total) by mouth daily. 12/09/15   Langeland, Leda Quail, MD  losartan-hydrochlorothiazide (HYZAAR) 100-25 MG tablet Take 1 tablet by mouth daily. Reported on 12/09/2015 09/22/16   Maren Reamer, MD  metFORMIN (GLUCOPHAGE) 500 MG tablet Take 0.5 tablets (250 mg total) by mouth daily with breakfast. 08/05/16   Maren Reamer, MD  methocarbamol (ROBAXIN) 500 MG tablet Take 1 tablet (500 mg total) by mouth every 8 (eight) hours as needed for muscle spasms. 01/08/17   Tresa Garter, MD  montelukast (SINGULAIR)  10 MG tablet Take 1 tablet (10 mg total) by mouth at bedtime. 02/16/17   Javier Glazier, MD  Multiple Vitamin (MULTIVITAMIN) tablet Take 1 tablet by mouth daily. Reported on 12/09/2015 12/09/15   Maren Reamer, MD  naproxen (NAPROSYN) 500 MG tablet Take 1 tablet (500 mg total) by mouth 2 (two) times daily with a meal. 09/22/16   Langeland, Dawn T, MD  Omega-3 Fatty Acids (FISH OIL PO) Take 1 capsule by mouth at bedtime. Reported on 12/09/2015    [provider]  pantoprazole (PROTONIX) 40 MG tablet Take 1 tablet (40 mg total) by mouth 2 (two) times daily before a meal. 01/21/17   Mahala Menghini, PA-C  polyethylene glycol powder (GLYCOLAX/MIRALAX) powder Take 17 g by mouth 2 (two) times daily as needed. 03/05/16   Maren Reamer, MD  ranitidine (ZANTAC) 150 MG tablet TAKE 1 TABLET BY MOUTH AT BEDTIME. 01/20/17   Langeland, Dawn T, MD  traMADol (ULTRAM) 50 MG tablet TAKE 1 TABLET BY MOUTH EVERY 6 HOURS 03/02/17   Maren Reamer, MD    Family History Family History  Problem Relation Age of Onset  . Kidney cancer Father   . Hypertension Father   . Hyperlipidemia Father   . Diabetes Mother   . Hypertension Mother   . Heart Problems Mother   . Hyperlipidemia Mother   . Asthma Mother   . Liver disease Brother   . Arthritis Brother   . Hypertension Sister   . Allergic rhinitis Sister   . Stomach cancer Unknown        aunt  . Breast cancer Maternal Grandmother   . Kidney disease Maternal Grandfather   . Breast cancer Paternal Grandmother   . Hypertension Brother   . Hyperlipidemia Brother   . Hypertension Brother   . Hypertension Sister   . Hyperlipidemia Sister   . Lupus Maternal Aunt   . Colon cancer Neg Hx   . Eczema Neg Hx   . Immunodeficiency Neg Hx   . Urticaria Neg Hx     Social History Social History  Substance Use Topics  . Smoking status: Former Smoker    Packs/day: 0.50    Years: 10.00    Types: Cigarettes    Start date: 07/14/1975    Quit date: 04/04/1993   . Smokeless tobacco: Never Used     Comment: 17 yrs ago  . Alcohol use No     Allergies  Patient has no known allergies.   Review of Systems Review of Systems 10 Systems reviewed and are negative for acute change except as noted in the HPI.  Physical Exam Updated Vital Signs BP (!) 154/57 (BP Location: Left Arm)   Pulse 85   Temp 98.2 F (36.8 C) (Oral)   Resp 17   Ht 5\' 8"  (1.727 m)   Wt 135.2 kg (298 lb)   SpO2 93%   BMI 45.31 kg/m   Physical Exam  Constitutional: She is oriented to person, place, and time. She appears well-developed and well-nourished. No distress.  HENT:  Head: Normocephalic and atraumatic.  Nares are patent. There is no blood or clot within the naris. Oropharynx is normal. Tonsils are present. There is no exudate or erythema of the posterior oropharynx. No blood streaking. Dentition Is in good condition.  Eyes: Conjunctivae and EOM are normal.  Neck: Neck supple.  No stridor. No cervical swelling to palpation. Patient perceives the area of discomfort to be just right of the trachea and in the soft tissues of the upper, anterior neck.  Cardiovascular: Normal rate, regular rhythm, normal heart sounds and intact distal pulses.   No murmur heard. Pulmonary/Chest: Effort normal and breath sounds normal. No respiratory distress.  Abdominal: Soft. There is no tenderness.  Musculoskeletal: Normal range of motion. She exhibits no edema.  Neurological: She is alert and oriented to person, place, and time. No cranial nerve deficit. She exhibits normal muscle tone. Coordination normal.  Skin: Skin is warm and dry.  Psychiatric: She has a normal mood and affect.  Nursing note and vitals reviewed.    ED Treatments / Results  Labs (all labs ordered are listed, but only abnormal results are displayed) Labs Reviewed - No data to display  EKG  EKG Interpretation None       Radiology No results found.  Procedures Procedures (including critical care  time)  Medications Ordered in ED Medications - No data to display   Initial Impression / Assessment and Plan / ED Course  I have reviewed the triage vital signs and the nursing notes.  Pertinent labs & imaging results that were available during my care of the patient were reviewed by me and considered in my medical decision making (see chart for details).     Bronchoscopy reviewed from 03/08/2017 by Dr. [name]. He made comment on minimal abrasion to the left tonsil. No bleeding source or other mucosal abnormality was appreciated in the supraglottic retropharynx. He also commented on small, superficial erythematous ulceration in the 12:00 position immediately precarinal trachea with increased submucosal vascularity and no bleeding.  Final Clinical Impressions(s) / ED Diagnoses   Final diagnoses:  Hemoptysis  Foreign body sensation in throat   Objectively, there are no findings on physical examination. Patient has been eating and drinking normal foods. She did have a bronchoscopy within the past 4 days it examined the upper airway where she is feeling a foreign body sensation. None was identified at that time. As patient is stable without any evidence of obstruction or acute illness, she will be given Tessalon Perles to help with discomfort. She is counseled to follow-up with gastroenterology to discuss EGD and possible repeat evaluation with Dr. [name] if symptoms persist. New Prescriptions New Prescriptions   No medications on file     Charlesetta Shanks, MD 03/12/17 1110

## 2017-03-12 NOTE — ED Triage Notes (Signed)
Pt reports eating ribs 1 weeks ago. Felt she got a piece of rib stuck in her throat. Has endoscopy/bronchoscopy 6/4, they didn't find anything. Is still spitting up blood.

## 2017-03-15 MED FILL — LOSARTAN-HCTZ 100-25 MG TAB: 100-25 | 30 days supply | Qty: 30 | Fill #5

## 2017-03-22 MED FILL — MONTELUKAST SOD 10 MG TAB: 10 | 30 days supply | Qty: 30 | Fill #1

## 2017-03-25 ENCOUNTER — Encounter: Payer: Self-pay | Admitting: Pulmonary Disease

## 2017-03-25 ENCOUNTER — Ambulatory Visit: Payer: Medicaid Other | Attending: Internal Medicine | Admitting: Internal Medicine

## 2017-03-25 ENCOUNTER — Ambulatory Visit: Payer: Medicaid Other | Admitting: Internal Medicine

## 2017-03-25 ENCOUNTER — Ambulatory Visit (INDEPENDENT_AMBULATORY_CARE_PROVIDER_SITE_OTHER): Payer: Medicaid Other | Admitting: Pulmonary Disease

## 2017-03-25 ENCOUNTER — Encounter: Payer: Self-pay | Admitting: Internal Medicine

## 2017-03-25 VITALS — BP 127/73 | HR 76 | Temp 98.5°F | Resp 16 | Wt 303.0 lb

## 2017-03-25 VITALS — BP 150/98 | HR 86 | Ht 68.0 in | Wt 301.4 lb

## 2017-03-25 DIAGNOSIS — R1032 Left lower quadrant pain: Secondary | ICD-10-CM | POA: Diagnosis not present

## 2017-03-25 DIAGNOSIS — Z794 Long term (current) use of insulin: Secondary | ICD-10-CM | POA: Diagnosis not present

## 2017-03-25 DIAGNOSIS — R0609 Other forms of dyspnea: Secondary | ICD-10-CM | POA: Diagnosis not present

## 2017-03-25 DIAGNOSIS — M797 Fibromyalgia: Secondary | ICD-10-CM | POA: Diagnosis not present

## 2017-03-25 DIAGNOSIS — G4733 Obstructive sleep apnea (adult) (pediatric): Secondary | ICD-10-CM | POA: Insufficient documentation

## 2017-03-25 DIAGNOSIS — R7303 Prediabetes: Secondary | ICD-10-CM

## 2017-03-25 DIAGNOSIS — M170 Bilateral primary osteoarthritis of knee: Secondary | ICD-10-CM | POA: Diagnosis not present

## 2017-03-25 DIAGNOSIS — I10 Essential (primary) hypertension: Secondary | ICD-10-CM | POA: Diagnosis not present

## 2017-03-25 DIAGNOSIS — E063 Autoimmune thyroiditis: Secondary | ICD-10-CM | POA: Diagnosis not present

## 2017-03-25 DIAGNOSIS — J309 Allergic rhinitis, unspecified: Secondary | ICD-10-CM | POA: Diagnosis not present

## 2017-03-25 DIAGNOSIS — R042 Hemoptysis: Secondary | ICD-10-CM

## 2017-03-25 DIAGNOSIS — Z7982 Long term (current) use of aspirin: Secondary | ICD-10-CM | POA: Diagnosis not present

## 2017-03-25 DIAGNOSIS — K219 Gastro-esophageal reflux disease without esophagitis: Secondary | ICD-10-CM | POA: Diagnosis not present

## 2017-03-25 DIAGNOSIS — Z87891 Personal history of nicotine dependence: Secondary | ICD-10-CM | POA: Insufficient documentation

## 2017-03-25 DIAGNOSIS — E119 Type 2 diabetes mellitus without complications: Secondary | ICD-10-CM | POA: Insufficient documentation

## 2017-03-25 DIAGNOSIS — Z6841 Body Mass Index (BMI) 40.0 and over, adult: Secondary | ICD-10-CM | POA: Diagnosis not present

## 2017-03-25 LAB — GLUCOSE, POCT (MANUAL RESULT ENTRY): POC Glucose: 119 mg/dl — AB (ref 70–99)

## 2017-03-25 MED ORDER — BENZONATATE 100 MG PO CAPS: 100.0000 mg | ORAL_CAPSULE | Freq: Three times a day (TID) | ORAL | 3 refills | Status: DC | PRN

## 2017-03-25 MED ORDER — GABAPENTIN 300 MG PO CAPS: 600.0000 mg | ORAL_CAPSULE | Freq: Three times a day (TID) | ORAL | 5 refills | Status: DC

## 2017-03-25 MED FILL — BENZONATATE 100 MG CAPSULE: 100 | 20 days supply | Qty: 60 | Fill #0

## 2017-03-25 NOTE — Patient Instructions (Signed)
   You can continue to use the [employer] as needed.  I will see you back after you have seen the ENT doctor.  Call me if your cough gets worse or you have any new breathing problems.   You can stop using your Albuterol inhaler.

## 2017-03-25 NOTE — Progress Notes (Signed)
Subjective:    Patient ID: Robin [name], female    DOB: [birth year], 61 y.o.   MRN: [id number]  C.C.:  Follow-up for Dyspnea on Exertion, Hemoptysis, Chronic Allergic Rhinitis, & GERD.  HPI Since last appointment patient did undergo bronchoscopy with airway inspection. Patient had superficial abrasion on her tonsil. Airway exam showed a precarinal area of mucosal irregularity at 12:00 with some increased submucosal vascularity but no evidence of active bleeding or other airway lesion. Brushings were performed at this site and sent for culture as well as cytology. Culture was positive for Haemophilus influenzae that was treated with azithromycin.  Hemoptysis:  She reports she is still having an intermittent cough but no hemoptysis. She reports she was seen in the ED and had a throat irritation that is persisting. She reports she is spitting after clearing her throat and spitting blood out.   Dyspnea on exertion: This is bearable. Previously using albuterol intermittently. Methacholine challenge test ordered at last appointment & showed normal bronchial hyperreactivity. She still is having dyspnea on exertion but may be somewhat improved. She is wheezing some. She has noticed increased symptoms with high heat & humidity.   Chronic allergic rhinitis: Previously on intranasal steroid therapy with Rhinocort/Flonase, Allegra/Claritin, & Atrovent nasal spray. Started on Singulair 10 mg by mouth daily at bedtime last appointment. She reports she still has significant sinus congestion & drainage.   GERD: Previously on Dexilant. Following with GI. At last appointment patient had been changed to Protonix & Zantac. No reflux or dyspepsia. No morning brash water taste.   Review of Systems No chest pain, pressure, or tightness. No nausea or emesis. Only rare abdominal discomfort. No chest pain or pressure. No rashes but does bruise easily.   No Known Allergies  Current Outpatient Prescriptions on File  Prior to Visit  Medication Sig Dispense Refill  . acetaminophen (TYLENOL) 500 MG tablet Take 1 tablet (500 mg total) by mouth every 6 (six) hours as needed for headache. (Patient taking differently: Take 1,000 mg by mouth every 6 (six) hours as needed for headache. ) 30 tablet 0  . Albuterol Sulfate (PROAIR RESPICLICK) 408 (90 Base) MCG/ACT AEPB Inhale 2 puffs into the lungs as needed. 1 each 1  . amitriptyline (ELAVIL) 50 MG tablet Take 1 tablet (50 mg total) by mouth at bedtime. 60 tablet 3  . amLODipine (NORVASC) 10 MG tablet Take 1 tablet (10 mg total) by mouth daily. 90 tablet 3  . aspirin 81 MG tablet Take 1 tablet (81 mg total) by mouth at bedtime. Reported on 12/09/2015 90 tablet 3  . benzonatate (TESSALON) 100 MG capsule Take 1 capsule (100 mg total) by mouth every 8 (eight) hours. 21 capsule 0  . buPROPion (WELLBUTRIN XL) 300 MG 24 hr tablet Take 1 tablet (300 mg total) by mouth every morning. Reported on 12/09/2015 90 tablet 3  . citalopram (CELEXA) 20 MG tablet Take 1 tablet (20 mg total) by mouth daily. (Patient taking differently: Take 20 mg by mouth every other day. ) 60 tablet 2  . diclofenac sodium (VOLTAREN) 1 % GEL Apply 2 g topically 4 (four) times daily. (Patient taking differently: Apply 2 g topically 4 (four) times daily as needed (pain). ) 100 g 2  . ferrous sulfate 325 (65 FE) MG tablet TAKE 1 TABLET BY MOUTH 2 TIMES DAILY WITH A MEAL. (Patient taking differently: TAKE 1 TABLET BY MOUTH ONE TIME DAILY WITH A MEAL.) 60 tablet 0  . fexofenadine (ALLEGRA) 180 MG  tablet Take 1 tablet (180 mg total) by mouth daily. Reported on 12/09/2015 30 tablet 3  . fluticasone (FLONASE) 50 MCG/ACT nasal spray Place 2 sprays into both nostrils daily. 16 g 6  . gabapentin (NEURONTIN) 300 MG capsule Take 3 capsules (900 mg total) by mouth 3 (three) times daily. (Patient taking differently: Take 600 mg by mouth 2 (two) times daily. ) 180 capsule 2  . guaiFENesin-codeine (ROBITUSSIN AC) 100-10 MG/5ML  syrup Take 10 mLs by mouth 4 (four) times daily as needed for cough. 180 mL 0  . hydrALAZINE (APRESOLINE) 25 MG tablet Take 1 tablet (25 mg total) by mouth 3 (three) times daily. (Patient taking differently: Take 25 mg by mouth 2 (two) times daily. ) 90 tablet 3  . ipratropium (ATROVENT) 0.06 % nasal spray Place 2 sprays into both nostrils 4 (four) times daily. (Patient taking differently: Place 1 spray into both nostrils 4 (four) times daily. ) 15 mL 1  . levothyroxine (SYNTHROID, LEVOTHROID) 88 MCG tablet Take 1 tablet (88 mcg total) by mouth daily. 90 tablet 2  . linaclotide (LINZESS) 290 MCG CAPS capsule Take 1 capsule (290 mcg total) by mouth daily before breakfast. 30 capsule 5  . loratadine (CLARITIN) 10 MG tablet Take 1 tablet (10 mg total) by mouth daily. (Patient taking differently: Take 10 mg by mouth daily as needed for allergies. ) 30 tablet 11  . losartan-hydrochlorothiazide (HYZAAR) 100-25 MG tablet Take 1 tablet by mouth daily. Reported on 12/09/2015 90 tablet 2  . metFORMIN (GLUCOPHAGE) 500 MG tablet Take 0.5 tablets (250 mg total) by mouth daily with breakfast. 45 tablet 3  . methocarbamol (ROBAXIN) 500 MG tablet Take 1 tablet (500 mg total) by mouth every 8 (eight) hours as needed for muscle spasms. (Patient taking differently: Take 500 mg by mouth at bedtime as needed for muscle spasms. ) 60 tablet 1  . montelukast (SINGULAIR) 10 MG tablet Take 1 tablet (10 mg total) by mouth at bedtime. 30 tablet 3  . Multiple Vitamin (MULTIVITAMIN) tablet Take 1 tablet by mouth daily. Reported on 12/09/2015 90 tablet 1  . naproxen (NAPROSYN) 500 MG tablet Take 1 tablet (500 mg total) by mouth 2 (two) times daily with a meal. (Patient taking differently: Take 500 mg by mouth daily. ) 30 tablet 0  . Omega-3 Fatty Acids (FISH OIL PO) Take 1 capsule by mouth at bedtime. Reported on 12/09/2015    . pantoprazole (PROTONIX) 40 MG tablet Take 1 tablet (40 mg total) by mouth 2 (two) times daily before a meal. 60  tablet 5  . polyethylene glycol powder (GLYCOLAX/MIRALAX) powder Take 17 g by mouth 2 (two) times daily as needed. 3350 g 1  . ranitidine (ZANTAC) 150 MG tablet TAKE 1 TABLET BY MOUTH AT BEDTIME. 30 tablet 5  . traMADol (ULTRAM) 50 MG tablet TAKE 1 TABLET BY MOUTH EVERY 6 HOURS (Patient taking differently: TAKE 1 TABLET BY MOUTH TWICE A DAY) 60 tablet 0  . azithromycin (ZITHROMAX Z-PAK) 250 MG tablet Take two tablets today, then one tablet daily until gone (Patient not taking: Reported on 03/25/2017) 6 each 0  . benzonatate (TESSALON) 100 MG capsule Take 2 capsules (200 mg total) by mouth 3 (three) times daily as needed for cough. (Patient not taking: Reported on 03/25/2017) 21 capsule 0   No current facility-administered medications on file prior to visit.     Past Medical History:  Diagnosis Date  . Anxiety disorder   . Arthritis   . Chronic  neck pain   . Constipation   . Diabetes mellitus without complication ([place])   . GERD (gastroesophageal reflux disease)   . Hiatal hernia    small  . Hyperlipidemia   . Hypertension   . Schatzki's ring    non critical  . Sleep apnea    CPAP, Sleep study at [facility]    Past Surgical History:  Procedure Laterality Date  . ABDOMINAL HYSTERECTOMY    . ABDOMINAL HYSTERECTOMY  02/18/2000  . CARDIAC CATHETERIZATION  08/17/2001   normal L main/LAD/L Cfx/RCA (Dr. Adora Fridge)  . COLONOSCOPY  2006   Dr. [name], hyperplastic polyps  . COLONOSCOPY  08/06/11   abnormal terminal ileum for 10cm, erosions, geographical ulceration. Bx small bowel mucosa with prominent intramucosal lymphoid aggregates, slightly inflammed  . DIRECT LARYNGOSCOPY  03/15/2012   Procedure: DIRECT LARYNGOSCOPY;  Surgeon: [name], MD;  Location: [facility];  Service: ENT;  Laterality: N/A; Dr. Wolicki--:>no foreign body seen. normal esophagus to 40cm  . ESOPHAGEAL DILATION  04/17/2015   Procedure: ESOPHAGEAL DILATION;  Surgeon: [name], MD;  Location: [facility]  ENDO [facility];  Service: Endoscopy;;  . ESOPHAGOGASTRODUODENOSCOPY  08/23/2008   KVQ:QVZD distal esophageal erosions consistent with mild erosive reflux esophagitis, otherwise unremarkable esophagus/ Tiny antral erosions of doubtful clinical significance, otherwise normal stomach, patent pylorus, normal D1 and D2  . ESOPHAGOGASTRODUODENOSCOPY  08/06/11   small hh, noncritical Schatzki's ring s/p 87 F  . ESOPHAGOGASTRODUODENOSCOPY N/A 04/17/2015   GLO:VFIEPP s/p dilation  . ESOPHAGOSCOPY  03/15/2012   Procedure: ESOPHAGOSCOPY;  Surgeon: [name], MD;  Location: [facility];  Service: ENT;  Laterality: N/A;  . KNEE ARTHROSCOPY  04/27/2001  . NM MYOCAR PERF WALL MOTION  2003   negative bruce protocol exercise tress test; EF 68%; intermediate risk study due to evidence of anterior wall ischemia extending from mid-ventricle to apex  . 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 RECTOCELE REPAIR    . TRANSTHORACIC ECHOCARDIOGRAM  2003   EF normal  . VAGINAL PROLAPSE REPAIR    . VIDEO BRONCHOSCOPY Bilateral 03/08/2017   Procedure: VIDEO BRONCHOSCOPY WITHOUT FLUORO;  Surgeon: Javier Glazier, MD;  Location: [facility];  Service: Cardiopulmonary;  Laterality: Bilateral;    Family History  Problem Relation Age of Onset  . Kidney cancer Father   . Hypertension Father   . Hyperlipidemia Father   . Diabetes Mother   . Hypertension Mother   . Heart Problems Mother   . Hyperlipidemia Mother   . Asthma Mother   . Liver disease Brother   . Arthritis Brother   . Hypertension Sister   . Allergic rhinitis Sister   . Stomach cancer Unknown        aunt  . Breast cancer Maternal Grandmother   . Kidney disease Maternal Grandfather   . Breast cancer Paternal Grandmother   . Hypertension Brother   . Hyperlipidemia Brother   . Hypertension Brother   . Hypertension Sister   . Hyperlipidemia Sister   . Lupus Maternal Aunt   . Colon cancer Neg Hx   . Eczema Neg Hx   .  Immunodeficiency Neg Hx   . Urticaria Neg Hx     Social History   Social History  . Marital status: Married    Spouse name: N/A  . Number of children: 4  . Years of education: 10   Occupational History  .  Unemployed   Social History Main  Topics  . Smoking status: Former Smoker    Packs/day: 0.50    Years: 10.00    Types: Cigarettes    Start date: 07/14/1975    Quit date: 04/04/1993  . Smokeless tobacco: Never Used     Comment: 17 yrs ago  . Alcohol use No  . Drug use: No  . Sexual activity: Not Asked   Other Topics Concern  . None   Social History Narrative   Lives with husband in an apartment on the first floor.  Has 4 children.     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  Trying to get disability.  Formerly worked as a Teacher, early years/pre.  Education: 11th grade.      [facility] (12/23/16):   Originally from [facility]. She was raised in [place]. 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 No pets currently. No bird exposure. She does have mold in her current home in the bathroom, laundry room, & master bedroom.       Objective:   Physical Exam BP (!) 150/98 (BP Location: Left Arm, Patient Position: Sitting, Cuff Size: Large)   Pulse 86   Ht 5\' 8"  (1.727 m)   Wt (!) 301 lb 6.4 oz (136.7 kg)   SpO2 98%   BMI 45.83 kg/m   General:  Awake. Alert. No acute distress. Obese female. Integument:  Warm & dry. No rash on exposed skin. No bruising on exposed skin. Extremities:  No cyanosis or clubbing.  HEENT:  Moist mucus membranes. Mild bilateral nasal turbinate swelling. No oral ulcers. Cardiovascular:  Regular rate. No edema. Normal S1 & S2.  Pulmonary:  Clear to auscultation bilaterally. No accessory muscle use on room air. Abdomen: Soft. Normal bowel sounds. Nondistended. Grossly nontender. Musculoskeletal:  Normal bulk  and tone. No joint deformity or effusion appreciated.  PFT 02/09/17: FVC  2.35 L (77%) FEV1 2.06 L (85%) FEV1/FVC 0.88 FEF 25-75 3.32 L (142%) negative bronchodilator response TLC 3.74 L (68%) RV 71% ERV 28% DLCO corrected 71% 08/22/15: FVC 2.32 L (119%) FEV1 1.99 L (121%) FEV1/FVC 0.85 FEF 25-75 2.59 L (118%)  6MWT 02/16/17:  Walked 126 meters / Baseline Sat 99%  On RA / Nadir Sat 99% on RA  METHACHOLINE CHALLENGE TEST (02/24/17):  Normal bronchial hyperreactivity.  IMAGING CT CHEST W/O 01/07/17 (previously reviewed by me):  No parenchymal nodule, mass, or opacity appreciated. No pleural effusion or thickening. No pericardial effusion. No pathologic mediastinal adenopathy.  CTA CHEST 12/13/15 (previously reviewed by me): No pulmonary emboli. No pleural effusion or thickening. No pericardial effusion. No pathologic mediastinal adenopathy. No parenchymal nodule, mass, or opacification.  CARDIAC TTE (09/29/16): LV normal in size with moderate concentric hypertrophy. EF 55-60% with no regional wall motion abnormalities. Indeterminant diastolic function. LA & RA normal in size. RV normal in size and function. No aortic stenosis or regurgitation. Aortic root normal in size. No mitral stenosis or regurgitation. No significant pulmonic regurgitation. No significant tricuspid regurgitation. No pericardial effusion.  LEFT HEART CATHETERIZATION (11/13/3):  Aortic systolic pressure 638  Diastolic pressure 90  Left ventricular systolic pressure 756  End-diastolic pressure 27 left main coronary artery normal  Left anterior descending artery normal  Left circumflex coronary artery normal  Right coronary artery dominant and normal   Overall estimated ejection fraction greater than 60%  without focal wall motion abnormality  MICROBIOLOGY Endobronchial/Endotracheal Brushing 03/08/17: Rare Haemophilus influenza beta lactamase positive  PATHOLOGY Endobronchial/Endotracheal Brushing 03/08/17: No  malignancy. Negative for herpes & HPV.  LABS 02/16/17 IgE: 81 RAST panel: Cockroach 0.2 / D. farinae 0.2 / D. pteronyssinus 0.24   12/23/16 INR: 1.0 PTT: 24.1 Chromatin Ab:  <0.2 Smith Ab: <0.2 DS DNA Ab:  <1 SSA:  <0.2 SSB:  <0.2 Anti-CCP:  <16 SCL-70:  <0.2 ENA RNP Ab: Centromere Ab Screen:  <0.2 Jo-1 Ab:  <0.2  05/12/16 ANA: Negative Rheumatoid factor:  <10    Assessment & Plan:  61 y.o. Female with long history of dyspnea on exertion and now with intermittent hemoptysis, chronic allergic rhinitis, & GERD. Bronchoscopy showed a precarinal mucosal irregularity that could be the source for patient's hemoptysis but  given her description as above it sounds as though this is coming from her throat rather than from her airways. She is continuing to have symptoms from her allergic rhinitis and given these 2 problems I believe that evaluation by ENT may be helpful. We did discuss her methacholine challenge test results as well as the probability that her dyspnea on exertion is due to her physical deconditioning and obesity. I instructed the patient contact my office if she had any new breathing problems or questions before her next appointment.  1. Hemoptysis:  Likely due to superficial erosions on tonsils seen during bronchoscopy. Doubt secondary to endotracheal mucosal lesion especially given the patient's description as above. Referring to ENT for repeat evaluation of retropharynx. Holding on repeat bronchoscopy to reevaluate endotracheal lesion at this time. 2. Dyspnea on exertion:  Likely due to obesity and physical deconditioning. Discontinuing albuterol inhaler. No further testing at this time. 3. Chronic allergic rhinitis:  Patient continuing on current regimen. Referring to ENT for further evaluation and treatment recommendations. 4. GERD:  Controlled with Protonix & Zantac. No changes. 5. Health maintenance: Status post Tdap November 2017, Pneumovax November 2017 & influenza vaccine  November 2017. 6. Follow-up: Return to clinic in 3 months or sooner if needed.  Sonia Baller [name], M.D. [facility] Pager:  [phone] After 3pm or if no response, call [phone] 9:33 AM 03/25/17

## 2017-03-25 NOTE — Patient Instructions (Signed)
Follow a Healthy Eating Plan - You can do it! Limit sugary drinks.  Avoid sodas, sweet tea, sport or energy drinks, or fruit drinks.  Drink water, lo-fat milk, or diet drinks. Limit snack foods.   Cut back on candy, cake, cookies, chips, ice cream.  These are a special treat, only in small amounts. Eat plenty of vegetables.  Especially dark green, red, and orange vegetables. Aim for at least 3 servings a day. More is better! Include fruit in your daily diet.  Whole fruit is much healthier than fruit juice! Limit "white" bread, "white" pasta, "white" rice.   Choose "100% whole grain" products, brown or wild rice. Avoid fatty meats. Try "Meatless Monday" and choose eggs or beans one day a week.  When eating meat, choose lean meats like chicken, [place], and fish.  Grill, broil, or bake meats instead of frying, and eat poultry without the skin. Eat less salt.  Avoid frozen pizzas, frozen dinners and salty foods.  Use seasonings other than salt in cooking.  This can help blood pressure and keep you from swelling Beer, wine and liquor have calories.  If you can safely drink alcohol, limit to 1 drink per day for women, 2 drinks for men   Myofascial Pain Syndrome and Fibromyalgia Myofascial pain syndrome and fibromyalgia are both pain disorders. This pain may be felt mainly in your muscles.  Myofascial pain syndrome: ? Always has trigger points or tender points in the muscle that will cause pain when pressed. The pain may come and go. ? Usually affects your neck, upper back, and shoulder areas. The pain often radiates into your arms and hands.  Fibromyalgia: ? Has muscle pains and tenderness that come and go. ? Is often associated with fatigue and sleep disturbances. ? Has trigger points. ? Tends to be long-lasting (chronic), but is not life-threatening.  Fibromyalgia and myofascial pain are not the same. However, they often occur together. If you have both conditions, each can make the other worse.  Both are common and can cause enough pain and fatigue to make day-to-day activities difficult. What are the causes? The exact causes of fibromyalgia and myofascial pain are not known. People with certain gene types may be more likely to develop fibromyalgia. Some factors can be triggers for both conditions, such as:  Spine disorders.  Arthritis.  Severe injury (trauma) and other physical stressors.  Being under a lot of stress.  A medical illness.  What are the signs or symptoms? Fibromyalgia The main symptom of fibromyalgia is widespread pain and tenderness in your muscles. This can vary over time. Pain is sometimes described as stabbing, shooting, or burning. You may have tingling or numbness, too. You may also have sleep problems and fatigue. You may wake up feeling tired and groggy (fibro fog). Other symptoms may include:  Bowel and bladder problems.  Headaches.  Visual problems.  Problems with odors and noises.  Depression or mood changes.  Painful menstrual periods (dysmenorrhea).  Dry skin or eyes.  Myofascial pain syndrome Symptoms of myofascial pain syndrome include:  Tight, ropy bands of muscle.  Uncomfortable sensations in muscular areas, such as: ? Aching. ? Cramping. ? Burning. ? Numbness. ? Tingling. ? Muscle weakness.  Trouble moving certain muscles freely (range of motion).  How is this diagnosed? There are no specific tests to diagnose fibromyalgia or myofascial pain syndrome. Both can be hard to diagnose because their symptoms are common in many other conditions. Your health care provider may suspect one or  both of these conditions based on your symptoms and medical history. Your health care provider will also do a physical exam. The key to diagnosing fibromyalgia is having pain, fatigue, and other symptoms for more than three months that cannot be explained by another condition. The key to diagnosing myofascial pain syndrome is finding trigger  points in muscles that are tender and cause pain elsewhere in your body (referred pain). How is this treated? Treating fibromyalgia and myofascial pain often requires a team of health care providers. This usually starts with your primary provider and a physical therapist. You may also find it helpful to work with alternative health care providers, such as massage therapists or acupuncturists. Treatment for fibromyalgia may include medicines. This may include nonsteroidal anti-inflammatory drugs (NSAIDs), along with other medicines. Treatment for myofascial pain may also include:  NSAIDs.  Cooling and stretching of muscles.  Trigger point injections.  Sound wave (ultrasound) treatments to stimulate muscles.  Follow these instructions at home:  Take medicines only as directed by your health care provider.  Exercise as directed by your health care provider or physical therapist.  Try to avoid stressful situations.  Practice relaxation techniques to control your stress. You may want to try: ? Biofeedback. ? Visual imagery. ? Hypnosis. ? Muscle relaxation. ? Yoga. ? Meditation.  Talk to your health care provider about alternative treatments, such as acupuncture or massage treatment.  Maintain a healthy lifestyle. This includes eating a healthy diet and getting enough sleep.  Consider joining a support group.  Do not do activities that stress or strain your muscles. That includes repetitive motions and heavy lifting. Where to find more information:  National Fibromyalgia Association: www.fmaware.[place]: www.arthritis.org  American Chronic Pain Association: OEMDeals.dk Contact a health care provider if:  You have new symptoms.  Your symptoms get worse.  You have side effects from your medicines.  You have trouble sleeping.  Your condition is causing depression or anxiety. This information is not intended to replace  advice given to you by your health care provider. Make sure you discuss any questions you have with your health care provider. Document Released: 09/21/2005 Document Revised: 02/27/2016 Document Reviewed: 06/27/2014 Elsevier Interactive Patient Education  [employer].

## 2017-03-25 NOTE — Progress Notes (Signed)
Patient ID: DEEDRA PRO, female    DOB: January 04, 1956  MRN: [id number]  CC: Hypertension and Referral (ENT)   Subjective: Robin [name] is a 61 y.o. female who presents for f/u appt Her concerns today include:  Patient with history of hypertension, pre-diabetes, obstructive sleep apnea, fibromyalgia, obesity,  GERD, hypothyroid. 1. C/o flare of Fibromyalgia since last wk.  "My arms are stuff and ache." -taking Neurontin BID 600 mg BID -not getting in much exercise due to arthritis in both knees. "But I do try to walk some during the day."   2. Obesity/pre-DM: "I eat too much sometime." -on Metformin  3.  Request ENT referral She has been seeing pulmonologist, Dr. [name], recently through [facility] clinic for Landa and hemoptysis -Had bronchoscopy with culture positive for Haemophilus that was treated with Zithromax. Incidental finding on bronchoscopy revealed superficial abrasion on her tonsil. Said she is still having some hemoptysis Dr. [name] recommended ENT referral for repeat evaluation of retrosternal pharynx has superficial abrasions were seen on the tonsils -PFTs with methacholine challenge tests showed normal bronchial hyperreactivity. She is using albuterol intermittently -DOE is thought to be due to physical deconditioning and discontinuation of albuterol inhaler was advised  3.  HTN: compliant with meds and salt restriction  4. HM: Hysterectomy 2002 Patient Active Problem List   Diagnosis Date Noted  . Diabetes mellitus without complication ([place]) 27/78/2423  . LLQ pain 01/19/2017  . Dyspnea on exertion 12/23/2016  . Chronic allergic rhinitis 12/23/2016  . Hemoptysis 12/23/2016  . Fibromyalgia 08/18/2016  . Chronic lymphocytic thyroiditis 08/18/2016  . Depression 04/01/2016  . OSA (obstructive sleep apnea) 04/01/2016  . HTN (hypertension), benign 12/09/2015  . Arthritis 12/09/2015  . Hypothyroidism 12/09/2015  . Morbid obesity ([place]) 12/09/2015  . Constipation  05/27/2015  . Constipation 05/27/2015  . Fatty liver 05/27/2015  . Dysphagia, pharyngoesophageal phase   . Epigastric pain 07/04/2012  . Abdominal pain, generalized 09/18/2011  . Abnormal small bowel biopsy 09/18/2011  . Lactose intolerance 09/18/2011  . Anorexia 06/25/2011  . Dysuria 06/25/2011  . Bowel habit changes 06/25/2011  . GERD 01/21/2010  . DYSPHAGIA 01/21/2010     Current Outpatient Prescriptions on File Prior to Visit  Medication Sig Dispense Refill  . acetaminophen (TYLENOL) 500 MG tablet Take 1 tablet (500 mg total) by mouth every 6 (six) hours as needed for headache. (Patient taking differently: Take 1,000 mg by mouth every 6 (six) hours as needed for headache. ) 30 tablet 0  . amitriptyline (ELAVIL) 50 MG tablet Take 1 tablet (50 mg total) by mouth at bedtime. 60 tablet 3  . amLODipine (NORVASC) 10 MG tablet Take 1 tablet (10 mg total) by mouth daily. 90 tablet 3  . aspirin 81 MG tablet Take 1 tablet (81 mg total) by mouth at bedtime. Reported on 12/09/2015 90 tablet 3  . buPROPion (WELLBUTRIN XL) 300 MG 24 hr tablet Take 1 tablet (300 mg total) by mouth every morning. Reported on 12/09/2015 90 tablet 3  . citalopram (CELEXA) 20 MG tablet Take 1 tablet (20 mg total) by mouth daily. (Patient taking differently: Take 20 mg by mouth every other day. ) 60 tablet 2  . diclofenac sodium (VOLTAREN) 1 % GEL Apply 2 g topically 4 (four) times daily. (Patient taking differently: Apply 2 g topically 4 (four) times daily as needed (pain). ) 100 g 2  . ferrous sulfate 325 (65 FE) MG tablet TAKE 1 TABLET BY MOUTH 2 TIMES DAILY WITH A MEAL. (Patient  taking differently: TAKE 1 TABLET BY MOUTH ONE TIME DAILY WITH A MEAL.) 60 tablet 0  . fexofenadine (ALLEGRA) 180 MG tablet Take 1 tablet (180 mg total) by mouth daily. Reported on 12/09/2015 30 tablet 3  . fluticasone (FLONASE) 50 MCG/ACT nasal spray Place 2 sprays into both nostrils daily. 16 g 6  . guaiFENesin-codeine (ROBITUSSIN AC) 100-10  MG/5ML syrup Take 10 mLs by mouth 4 (four) times daily as needed for cough. 180 mL 0  . hydrALAZINE (APRESOLINE) 25 MG tablet Take 1 tablet (25 mg total) by mouth 3 (three) times daily. (Patient taking differently: Take 25 mg by mouth 2 (two) times daily. ) 90 tablet 3  . ipratropium (ATROVENT) 0.06 % nasal spray Place 2 sprays into both nostrils 4 (four) times daily. (Patient taking differently: Place 1 spray into both nostrils 4 (four) times daily. ) 15 mL 1  . levothyroxine (SYNTHROID, LEVOTHROID) 88 MCG tablet Take 1 tablet (88 mcg total) by mouth daily. 90 tablet 2  . linaclotide (LINZESS) 290 MCG CAPS capsule Take 1 capsule (290 mcg total) by mouth daily before breakfast. 30 capsule 5  . loratadine (CLARITIN) 10 MG tablet Take 1 tablet (10 mg total) by mouth daily. (Patient taking differently: Take 10 mg by mouth daily as needed for allergies. ) 30 tablet 11  . losartan-hydrochlorothiazide (HYZAAR) 100-25 MG tablet Take 1 tablet by mouth daily. Reported on 12/09/2015 90 tablet 2  . metFORMIN (GLUCOPHAGE) 500 MG tablet Take 0.5 tablets (250 mg total) by mouth daily with breakfast. 45 tablet 3  . methocarbamol (ROBAXIN) 500 MG tablet Take 1 tablet (500 mg total) by mouth every 8 (eight) hours as needed for muscle spasms. (Patient taking differently: Take 500 mg by mouth at bedtime as needed for muscle spasms. ) 60 tablet 1  . montelukast (SINGULAIR) 10 MG tablet Take 1 tablet (10 mg total) by mouth at bedtime. 30 tablet 3  . Multiple Vitamin (MULTIVITAMIN) tablet Take 1 tablet by mouth daily. Reported on 12/09/2015 90 tablet 1  . naproxen (NAPROSYN) 500 MG tablet Take 1 tablet (500 mg total) by mouth 2 (two) times daily with a meal. (Patient taking differently: Take 500 mg by mouth daily. ) 30 tablet 0  . Omega-3 Fatty Acids (FISH OIL PO) Take 1 capsule by mouth at bedtime. Reported on 12/09/2015    . pantoprazole (PROTONIX) 40 MG tablet Take 1 tablet (40 mg total) by mouth 2 (two) times daily before a  meal. 60 tablet 5  . polyethylene glycol powder (GLYCOLAX/MIRALAX) powder Take 17 g by mouth 2 (two) times daily as needed. 3350 g 1  . ranitidine (ZANTAC) 150 MG tablet TAKE 1 TABLET BY MOUTH AT BEDTIME. 30 tablet 5  . traMADol (ULTRAM) 50 MG tablet TAKE 1 TABLET BY MOUTH EVERY 6 HOURS (Patient taking differently: TAKE 1 TABLET BY MOUTH TWICE A DAY) 60 tablet 0   No current facility-administered medications on file prior to visit.     No Known Allergies  Social History   Social History  . Marital status: Married    Spouse name: N/A  . Number of children: 4  . Years of education: 10   Occupational History  .  Unemployed   Social History Main Topics  . Smoking status: Former Smoker    Packs/day: 0.50    Years: 10.00    Types: Cigarettes    Start date: 07/14/1975    Quit date: 04/04/1993  . Smokeless tobacco: Never Used  Comment: 17 yrs ago  . Alcohol use No  . Drug use: No  . Sexual activity: Not on file   Other Topics Concern  . Not on file   Social History Narrative   Lives with husband in an apartment on the first floor.  Has 4 children.     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  Trying to get disability.  Formerly worked as a Teacher, early years/pre.  Education: 11th grade.      [facility] (12/23/16):   Originally from [place] [facility] [facility] [place] [facility]. She was raised in [place]. 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 No pets currently. No bird exposure. She does have mold in her current home in the bathroom, laundry room, & master bedroom.     Family History  Problem Relation Age of Onset  . Kidney cancer Father   . Hypertension Father   . Hyperlipidemia Father   . Diabetes Mother   . Hypertension Mother   . Heart Problems Mother   . Hyperlipidemia Mother   . Asthma Mother   . Liver disease Brother   . Arthritis Brother   .  Hypertension Sister   . Allergic rhinitis Sister   . Stomach cancer Unknown        aunt  . Breast cancer Maternal Grandmother   . Kidney disease Maternal Grandfather   . Breast cancer Paternal Grandmother   . Hypertension Brother   . Hyperlipidemia Brother   . Hypertension Brother   . Hypertension Sister   . Hyperlipidemia Sister   . Lupus Maternal Aunt   . Colon cancer Neg Hx   . Eczema Neg Hx   . Immunodeficiency Neg Hx   . Urticaria Neg Hx     Past Surgical History:  Procedure Laterality Date  . ABDOMINAL HYSTERECTOMY    . ABDOMINAL HYSTERECTOMY  02/18/2000  . CARDIAC CATHETERIZATION  08/17/2001   normal L main/LAD/L Cfx/RCA (Dr. Adora Fridge)  . COLONOSCOPY  2006   Dr. [name], hyperplastic polyps  . COLONOSCOPY  08/06/11   abnormal terminal ileum for 10cm, erosions, geographical ulceration. Bx small bowel mucosa with prominent intramucosal lymphoid aggregates, slightly inflammed  . DIRECT LARYNGOSCOPY  03/15/2012   Procedure: DIRECT LARYNGOSCOPY;  Surgeon: [name], MD;  Location: [place];  Service: ENT;  Laterality: N/A; Dr. Wolicki--:>no foreign body seen. normal esophagus to 40cm  . ESOPHAGEAL DILATION  04/17/2015   Procedure: ESOPHAGEAL DILATION;  Surgeon: [name], MD;  [facility];  Service: Endoscopy;;  . ESOPHAGOGASTRODUODENOSCOPY  08/23/2008   GEX:BMWU distal esophageal erosions consistent with mild erosive reflux esophagitis, otherwise unremarkable esophagus/ Tiny antral erosions of doubtful clinical significance, otherwise normal stomach, patent pylorus, normal D1 and D2  . ESOPHAGOGASTRODUODENOSCOPY  08/06/11   small hh, noncritical Schatzki's ring s/p 75 F  . ESOPHAGOGASTRODUODENOSCOPY N/A 04/17/2015   XLK:GMWNUU s/p dilation  . ESOPHAGOSCOPY  03/15/2012   Procedure: ESOPHAGOSCOPY;  Surgeon: [name], MD;  Location: [place];  Service: ENT;  Laterality: N/A;  . KNEE ARTHROSCOPY  04/27/2001  . NM MYOCAR PERF WALL MOTION  2003   negative  bruce protocol exercise tress test; EF 68%; intermediate risk study due to evidence of anterior wall ischemia extending from mid-ventricle to apex  . PITUITARY SURGERY  06/2012   benign tumor, surgeon at [facility]  .  RECTOCELE REPAIR    . TRANSTHORACIC ECHOCARDIOGRAM  2003   EF normal  . VAGINAL PROLAPSE REPAIR    . VIDEO BRONCHOSCOPY Bilateral 03/08/2017   Procedure: VIDEO BRONCHOSCOPY WITHOUT FLUORO;  Surgeon: Javier Glazier, MD;  Location: [place];  Service: Cardiopulmonary;  Laterality: Bilateral;    ROS: Review of Systems As stated above  PHYSICAL EXAM: BP 127/73   Pulse 76   Temp 98.5 F (36.9 C) (Oral)   Resp 16   Wt (!) 303 lb (137.4 kg)   SpO2 97%   BMI 46.07 kg/m   Physical Exam General appearance - alert, well appearing, Middle-aged obese African-American female and in no distress Mental status - alert, oriented to person, place, and time, normal mood, behavior, speech, dress, motor activity, and thought processes Mouth - mucous membranes moist, pharynx normal without lesions Neck - supple, no significant adenopathy Chest - clear to auscultation, no wheezes, rales or rhonchi, symmetric air entry Heart - normal rate, regular rhythm, normal S1, S2, no murmurs, rubs, clicks or gallops Musculoskeletal -she has mild diffuse tenderness on palpation of the soft tissues in the legs and arms Extremities - no lower extremity edema  Results for orders placed or performed in visit on 03/25/17  POCT glucose (manual entry)  Result Value Ref Range   POC Glucose 119 (A) 70 - 99 mg/dl    ASSESSMENT AND PLAN: 1. Fibromyalgia -Discussed the importance of graded exercise. I have encouraged her to walk several days a week for 15-30 minutes. We agreed to increase Neurontin which she was taking 600 mg twice a day to 600 mg 3 times a day - gabapentin (NEURONTIN) 300 MG capsule; Take 2 capsules (600 mg total) by mouth 3 (three) times daily.  Dispense: 180 capsule; Refill: 5  2.  Pre-diabetes -on Metformin - POCT glucose (manual entry)  3. Essential hypertension -at goal Continue current medications and DASH diet  4. Class 3 severe obesity due to excess calories without serious comorbidity with body mass index (BMI) of 45.0 to 49.9 in adult [facility]) -Discussed the importance of regular exercise 3-4 times a week for 15-30 minutes as tolerated. We also discussed some dietary changes and I printed a summary of recommendations.  5. Hemoptysis -ENT referral submitted. Referral was submitted by her pulmonologist but patient states her insurance requires that the referral will be made by her primary.  Patient was given the opportunity to ask questions.  Patient verbalized understanding of the plan and was able to repeat key elements of the plan.   Orders Placed This Encounter  Procedures  . POCT glucose (manual entry)     Requested Prescriptions   Signed Prescriptions Disp Refills  . gabapentin (NEURONTIN) 300 MG capsule 180 capsule 5    Sig: Take 2 capsules (600 mg total) by mouth 3 (three) times daily.    Return in about 2 months (around 05/25/2017).  Karle Plumber, MD, FACP

## 2017-03-29 MED FILL — PANTOPRAZOLE SOD DR 40 MG T: 40 | 30 days supply | Qty: 60 | Fill #2

## 2017-03-29 MED FILL — metFORMIN HCL 500 MG TABS: 500 | 30 days supply | Qty: 15 | Fill #3

## 2017-03-29 MED FILL — AMITRIPTYLINE HCL 50 MG TAB: 50 | 30 days supply | Qty: 30 | Fill #3

## 2017-03-30 ENCOUNTER — Telehealth: Payer: Self-pay | Admitting: Internal Medicine

## 2017-03-30 DIAGNOSIS — M797 Fibromyalgia: Secondary | ICD-10-CM

## 2017-03-30 MED ORDER — GABAPENTIN 300 MG PO CAPS: 600.0000 mg | ORAL_CAPSULE | Freq: Three times a day (TID) | ORAL | 5 refills | Status: DC

## 2017-03-30 MED FILL — GABAPENTIN 300 MG CAPSULE: 300 | 30 days supply | Qty: 180 | Fill #0

## 2017-03-30 MED FILL — LINZESS 290 MCG CAPSULE: 290 | 30 days supply | Qty: 30 | Fill #2

## 2017-03-30 NOTE — Telephone Encounter (Signed)
Gabapentin was sent to [facility] by Dr. Wynetta Emery a few days ago, resent it to [facility]

## 2017-03-30 NOTE — Telephone Encounter (Signed)
Pt. Came to facility requesting for her gabagabapentin (NEURONTIN) 300 MG capsule  Be sent to [facility] pharmacy. Please f/u

## 2017-03-31 MED FILL — AMLODIPINE BESYLATE 10 MG T: 10 | 30 days supply | Qty: 30 | Fill #6

## 2017-04-05 DIAGNOSIS — R070 Pain in throat: Secondary | ICD-10-CM | POA: Insufficient documentation

## 2017-04-08 ENCOUNTER — Encounter: Payer: Self-pay | Admitting: Internal Medicine

## 2017-04-09 ENCOUNTER — Telehealth: Payer: Self-pay | Admitting: Internal Medicine

## 2017-04-09 MED ORDER — TRAMADOL HCL 50 MG PO TABS: 50.0000 mg | ORAL_TABLET | Freq: Two times a day (BID) | ORAL | 0 refills | Status: DC | PRN

## 2017-04-09 MED FILL — LEVOTHYROXINE 88 MCG TABLET: 88 | 30 days supply | Qty: 30 | Fill #6

## 2017-04-09 MED FILL — BUPROPION HCL XL 300 MG TAB: 300 | 30 days supply | Qty: 30 | Fill #0

## 2017-04-09 NOTE — Telephone Encounter (Signed)
PT called to request a refill for traMADol (ULTRAM) 50 MG tablet   Please follow up

## 2017-04-14 ENCOUNTER — Other Ambulatory Visit: Payer: Self-pay | Admitting: Otolaryngology

## 2017-04-14 DIAGNOSIS — R070 Pain in throat: Secondary | ICD-10-CM

## 2017-04-14 DIAGNOSIS — R1314 Dysphagia, pharyngoesophageal phase: Secondary | ICD-10-CM

## 2017-04-14 MED FILL — traMADol HCL 50 MG TABS: 50 | 20 days supply | Qty: 40 | Fill #0

## 2017-04-14 MED FILL — LOSARTAN-HCTZ 100-25 MG TAB: 100-25 | 30 days supply | Qty: 30 | Fill #6

## 2017-04-19 ENCOUNTER — Ambulatory Visit
Admission: RE | Admit: 2017-04-19 | Discharge: 2017-04-19 | Disposition: A | Discharge status: Home / Self Care | Payer: Medicaid Other | Source: Ambulatory Visit | Attending: Otolaryngology | Admitting: Otolaryngology

## 2017-04-19 DIAGNOSIS — R070 Pain in throat: Secondary | ICD-10-CM

## 2017-04-19 DIAGNOSIS — R1314 Dysphagia, pharyngoesophageal phase: Secondary | ICD-10-CM

## 2017-04-19 MED ORDER — IOPAMIDOL (ISOVUE-300) INJECTION 61%
75.0000 mL | Freq: Once | INTRAVENOUS | Status: AC | PRN
  Administered 2017-04-19: 75 mL via INTRAVENOUS

## 2017-04-22 ENCOUNTER — Other Ambulatory Visit: Payer: Self-pay | Admitting: Otolaryngology

## 2017-04-22 DIAGNOSIS — R1314 Dysphagia, pharyngoesophageal phase: Secondary | ICD-10-CM

## 2017-04-23 MED FILL — MONTELUKAST SOD 10 MG TAB: 10 | 30 days supply | Qty: 30 | Fill #2

## 2017-04-23 MED FILL — PANTOPRAZOLE SOD DR 40 MG T: 40 | 30 days supply | Qty: 60 | Fill #3

## 2017-04-23 MED FILL — AMITRIPTYLINE HCL 50 MG TAB: 50 | 30 days supply | Qty: 30 | Fill #4

## 2017-04-26 ENCOUNTER — Other Ambulatory Visit (HOSPITAL_COMMUNITY)
Admission: RE | Admit: 2017-04-26 | Discharge: 2017-04-26 | Disposition: A | Discharge status: Home / Self Care | Payer: Medicaid Other | Source: Ambulatory Visit | Attending: Internal Medicine | Admitting: Internal Medicine

## 2017-04-26 ENCOUNTER — Encounter: Payer: Self-pay | Admitting: Internal Medicine

## 2017-04-26 ENCOUNTER — Ambulatory Visit: Payer: Medicaid Other | Attending: Internal Medicine | Admitting: Internal Medicine

## 2017-04-26 VITALS — BP 121/81 | HR 80 | Temp 98.5°F | Resp 16 | Wt 303.6 lb

## 2017-04-26 DIAGNOSIS — G4733 Obstructive sleep apnea (adult) (pediatric): Secondary | ICD-10-CM | POA: Diagnosis not present

## 2017-04-26 DIAGNOSIS — R350 Frequency of micturition: Secondary | ICD-10-CM | POA: Diagnosis not present

## 2017-04-26 DIAGNOSIS — Z8379 Family history of other diseases of the digestive system: Secondary | ICD-10-CM | POA: Insufficient documentation

## 2017-04-26 DIAGNOSIS — L298 Other pruritus: Secondary | ICD-10-CM | POA: Diagnosis not present

## 2017-04-26 DIAGNOSIS — E119 Type 2 diabetes mellitus without complications: Secondary | ICD-10-CM | POA: Insufficient documentation

## 2017-04-26 DIAGNOSIS — R300 Dysuria: Secondary | ICD-10-CM | POA: Diagnosis not present

## 2017-04-26 DIAGNOSIS — Z833 Family history of diabetes mellitus: Secondary | ICD-10-CM | POA: Diagnosis not present

## 2017-04-26 DIAGNOSIS — Z8261 Family history of arthritis: Secondary | ICD-10-CM | POA: Diagnosis not present

## 2017-04-26 DIAGNOSIS — Z6841 Body Mass Index (BMI) 40.0 and over, adult: Secondary | ICD-10-CM | POA: Insufficient documentation

## 2017-04-26 DIAGNOSIS — Z7951 Long term (current) use of inhaled steroids: Secondary | ICD-10-CM | POA: Diagnosis not present

## 2017-04-26 DIAGNOSIS — K219 Gastro-esophageal reflux disease without esophagitis: Secondary | ICD-10-CM | POA: Insufficient documentation

## 2017-04-26 DIAGNOSIS — K760 Fatty (change of) liver, not elsewhere classified: Secondary | ICD-10-CM | POA: Diagnosis not present

## 2017-04-26 DIAGNOSIS — B373 Candidiasis of vulva and vagina: Secondary | ICD-10-CM | POA: Insufficient documentation

## 2017-04-26 DIAGNOSIS — M797 Fibromyalgia: Secondary | ICD-10-CM | POA: Insufficient documentation

## 2017-04-26 DIAGNOSIS — I10 Essential (primary) hypertension: Secondary | ICD-10-CM | POA: Insufficient documentation

## 2017-04-26 DIAGNOSIS — Z79899 Other long term (current) drug therapy: Secondary | ICD-10-CM | POA: Diagnosis not present

## 2017-04-26 DIAGNOSIS — Z7984 Long term (current) use of oral hypoglycemic drugs: Secondary | ICD-10-CM | POA: Insufficient documentation

## 2017-04-26 DIAGNOSIS — B9689 Other specified bacterial agents as the cause of diseases classified elsewhere: Secondary | ICD-10-CM | POA: Diagnosis not present

## 2017-04-26 DIAGNOSIS — Z7982 Long term (current) use of aspirin: Secondary | ICD-10-CM | POA: Insufficient documentation

## 2017-04-26 DIAGNOSIS — Z8051 Family history of malignant neoplasm of kidney: Secondary | ICD-10-CM | POA: Diagnosis not present

## 2017-04-26 DIAGNOSIS — F329 Major depressive disorder, single episode, unspecified: Secondary | ICD-10-CM | POA: Insufficient documentation

## 2017-04-26 DIAGNOSIS — Z87891 Personal history of nicotine dependence: Secondary | ICD-10-CM | POA: Diagnosis not present

## 2017-04-26 DIAGNOSIS — N898 Other specified noninflammatory disorders of vagina: Secondary | ICD-10-CM

## 2017-04-26 DIAGNOSIS — Z8249 Family history of ischemic heart disease and other diseases of the circulatory system: Secondary | ICD-10-CM | POA: Diagnosis not present

## 2017-04-26 DIAGNOSIS — M1990 Unspecified osteoarthritis, unspecified site: Secondary | ICD-10-CM | POA: Insufficient documentation

## 2017-04-26 LAB — POCT URINALYSIS DIPSTICK
BILIRUBIN UA: NEGATIVE
GLUCOSE UA: NEGATIVE
KETONES UA: NEGATIVE
Leukocytes, UA: NEGATIVE
Nitrite, UA: NEGATIVE
PH UA: 6.5 (ref 5.0–8.0)
Protein, UA: NEGATIVE
Spec Grav, UA: 1.01 (ref 1.010–1.025)
Urobilinogen, UA: 0.2 E.U./dL

## 2017-04-26 MED ORDER — FLUCONAZOLE 150 MG PO TABS: 150.0000 mg | ORAL_TABLET | Freq: Once | ORAL | 0 refills | Status: AC

## 2017-04-26 MED ORDER — NITROFURANTOIN MONOHYD MACRO 100 MG PO CAPS: 100.0000 mg | ORAL_CAPSULE | Freq: Two times a day (BID) | ORAL | 0 refills | Status: DC

## 2017-04-26 MED FILL — FLUCONAZOLE 150 MG TABLET: 150 | 1 days supply | Qty: 1 | Fill #0

## 2017-04-26 MED FILL — raNITIdine HCL 150 MG TABS: 150 | 30 days supply | Qty: 30 | Fill #2

## 2017-04-26 MED FILL — BENZONATATE 100 MG CAPSULE: 100 | 20 days supply | Qty: 60 | Fill #1

## 2017-04-26 NOTE — Progress Notes (Signed)
Patient ID: CHASLYN EISEN, female    DOB: 1956-02-19  MRN: [id number]  CC: Urinary Tract Infection (possible)   Subjective: Elainna Eshleman is a 61 y.o. female who presents for UC Her concerns today include:  C/o burning with urination x 5 days -+ vaginal itching on outside vagina. No abnormal dischg.   Patient Active Problem List   Diagnosis Date Noted  . Diabetes mellitus without complication ([place]) 85/46/2703  . LLQ pain 01/19/2017  . Dyspnea on exertion 12/23/2016  . Chronic allergic rhinitis 12/23/2016  . Hemoptysis 12/23/2016  . Fibromyalgia 08/18/2016  . Chronic lymphocytic thyroiditis 08/18/2016  . Depression 04/01/2016  . OSA (obstructive sleep apnea) 04/01/2016  . HTN (hypertension), benign 12/09/2015  . Arthritis 12/09/2015  . Hypothyroidism 12/09/2015  . Morbid obesity ([place]) 12/09/2015  . Constipation 05/27/2015  . Fatty liver 05/27/2015  . Dysphagia, pharyngoesophageal phase   . Epigastric pain 07/04/2012  . Abdominal pain, generalized 09/18/2011  . Abnormal small bowel biopsy 09/18/2011  . Lactose intolerance 09/18/2011  . Anorexia 06/25/2011  . Dysuria 06/25/2011  . Bowel habit changes 06/25/2011  . GERD 01/21/2010  . DYSPHAGIA 01/21/2010     Current Outpatient Prescriptions on File Prior to Visit  Medication Sig Dispense Refill  . acetaminophen (TYLENOL) 500 MG tablet Take 1 tablet (500 mg total) by mouth every 6 (six) hours as needed for headache. (Patient taking differently: Take 1,000 mg by mouth every 6 (six) hours as needed for headache. ) 30 tablet 0  . amitriptyline (ELAVIL) 50 MG tablet Take 1 tablet (50 mg total) by mouth at bedtime. 60 tablet 3  . amLODipine (NORVASC) 10 MG tablet Take 1 tablet (10 mg total) by mouth daily. 90 tablet 3  . aspirin 81 MG tablet Take 1 tablet (81 mg total) by mouth at bedtime. Reported on 12/09/2015 90 tablet 3  . buPROPion (WELLBUTRIN XL) 300 MG 24 hr tablet Take 1 tablet (300 mg total) by mouth every morning.  Reported on 12/09/2015 90 tablet 3  . citalopram (CELEXA) 20 MG tablet Take 1 tablet (20 mg total) by mouth daily. (Patient taking differently: Take 20 mg by mouth every other day. ) 60 tablet 2  . diclofenac sodium (VOLTAREN) 1 % GEL Apply 2 g topically 4 (four) times daily. (Patient taking differently: Apply 2 g topically 4 (four) times daily as needed (pain). ) 100 g 2  . ferrous sulfate 325 (65 FE) MG tablet TAKE 1 TABLET BY MOUTH 2 TIMES DAILY WITH A MEAL. (Patient taking differently: TAKE 1 TABLET BY MOUTH ONE TIME DAILY WITH A MEAL.) 60 tablet 0  . fexofenadine (ALLEGRA) 180 MG tablet Take 1 tablet (180 mg total) by mouth daily. Reported on 12/09/2015 30 tablet 3  . fluticasone (FLONASE) 50 MCG/ACT nasal spray Place 2 sprays into both nostrils daily. 16 g 6  . gabapentin (NEURONTIN) 300 MG capsule Take 2 capsules (600 mg total) by mouth 3 (three) times daily. 180 capsule 5  . guaiFENesin-codeine (ROBITUSSIN AC) 100-10 MG/5ML syrup Take 10 mLs by mouth 4 (four) times daily as needed for cough. 180 mL 0  . hydrALAZINE (APRESOLINE) 25 MG tablet Take 1 tablet (25 mg total) by mouth 3 (three) times daily. (Patient taking differently: Take 25 mg by mouth 2 (two) times daily. ) 90 tablet 3  . ipratropium (ATROVENT) 0.06 % nasal spray Place 2 sprays into both nostrils 4 (four) times daily. (Patient taking differently: Place 1 spray into both nostrils 4 (four) times daily. )  15 mL 1  . levothyroxine (SYNTHROID, LEVOTHROID) 88 MCG tablet Take 1 tablet (88 mcg total) by mouth daily. 90 tablet 2  . linaclotide (LINZESS) 290 MCG CAPS capsule Take 1 capsule (290 mcg total) by mouth daily before breakfast. 30 capsule 5  . loratadine (CLARITIN) 10 MG tablet Take 1 tablet (10 mg total) by mouth daily. (Patient taking differently: Take 10 mg by mouth daily as needed for allergies. ) 30 tablet 11  . losartan-hydrochlorothiazide (HYZAAR) 100-25 MG tablet Take 1 tablet by mouth daily. Reported on 12/09/2015 90 tablet 2  .  metFORMIN (GLUCOPHAGE) 500 MG tablet Take 0.5 tablets (250 mg total) by mouth daily with breakfast. 45 tablet 3  . methocarbamol (ROBAXIN) 500 MG tablet Take 1 tablet (500 mg total) by mouth every 8 (eight) hours as needed for muscle spasms. (Patient taking differently: Take 500 mg by mouth at bedtime as needed for muscle spasms. ) 60 tablet 1  . montelukast (SINGULAIR) 10 MG tablet Take 1 tablet (10 mg total) by mouth at bedtime. 30 tablet 3  . Multiple Vitamin (MULTIVITAMIN) tablet Take 1 tablet by mouth daily. Reported on 12/09/2015 90 tablet 1  . naproxen (NAPROSYN) 500 MG tablet Take 1 tablet (500 mg total) by mouth 2 (two) times daily with a meal. (Patient taking differently: Take 500 mg by mouth daily. ) 30 tablet 0  . Omega-3 Fatty Acids (FISH OIL PO) Take 1 capsule by mouth at bedtime. Reported on 12/09/2015    . pantoprazole (PROTONIX) 40 MG tablet Take 1 tablet (40 mg total) by mouth 2 (two) times daily before a meal. 60 tablet 5  . polyethylene glycol powder (GLYCOLAX/MIRALAX) powder Take 17 g by mouth 2 (two) times daily as needed. 3350 g 1  . ranitidine (ZANTAC) 150 MG tablet TAKE 1 TABLET BY MOUTH AT BEDTIME. 30 tablet 5  . traMADol (ULTRAM) 50 MG tablet Take 1 tablet (50 mg total) by mouth 2 (two) times daily as needed. 40 tablet 0   No current facility-administered medications on file prior to visit.     No Known Allergies  Social History   Social History  . Marital status: Married    Spouse name: N/A  . Number of children: 4  . Years of education: 10   Occupational History  .  Unemployed   Social History Main Topics  . Smoking status: Former Smoker    Packs/day: 0.50    Years: 10.00    Types: Cigarettes    Start date: 07/14/1975    Quit date: 04/04/1993  . Smokeless tobacco: Never Used     Comment: 17 yrs ago  . Alcohol use No  . Drug use: No  . Sexual activity: Not on file   Other Topics Concern  . Not on file   Social History Narrative   Lives with husband in  an apartment on the first floor.  Has 4 children.     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  Trying to get disability.  Formerly worked as a Teacher, early years/pre.  Education: 11th grade.      [facility] (12/23/16):   Originally from [place] She was raised in [place]. 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 No pets currently. No bird exposure. She does have mold in her current home in  the bathroom, laundry room, & master bedroom.     Family History  Problem Relation Age of Onset  . Kidney cancer Father   . Hypertension Father   . Hyperlipidemia Father   . Diabetes Mother   . Hypertension Mother   . Heart Problems Mother   . Hyperlipidemia Mother   . Asthma Mother   . Liver disease Brother   . Arthritis Brother   . Hypertension Sister   . Allergic rhinitis Sister   . Stomach cancer Unknown        aunt  . Breast cancer Maternal Grandmother   . Kidney disease Maternal Grandfather   . Breast cancer Paternal Grandmother   . Hypertension Brother   . Hyperlipidemia Brother   . Hypertension Brother   . Hypertension Sister   . Hyperlipidemia Sister   . Lupus Maternal Aunt   . Colon cancer Neg Hx   . Eczema Neg Hx   . Immunodeficiency Neg Hx   . Urticaria Neg Hx     Past Surgical History:  Procedure Laterality Date  . ABDOMINAL HYSTERECTOMY    . ABDOMINAL HYSTERECTOMY  02/18/2000  . CARDIAC CATHETERIZATION  08/17/2001   normal L main/LAD/L Cfx/RCA (Dr. Adora Fridge)  . COLONOSCOPY  2006   Dr. [name], hyperplastic polyps  . COLONOSCOPY  08/06/11   abnormal terminal ileum for 10cm, erosions, geographical ulceration. Bx small bowel mucosa with prominent intramucosal lymphoid aggregates, slightly inflammed  . DIRECT LARYNGOSCOPY  03/15/2012   Procedure: DIRECT LARYNGOSCOPY;  Surgeon: [name], MD;  Location: [place];   Service: ENT;  Laterality: N/A; Dr. Wolicki--:>no foreign body seen. normal esophagus to 40cm  . ESOPHAGEAL DILATION  04/17/2015   Procedure: ESOPHAGEAL DILATION;  Surgeon: [name], MD;  [facility];  Service: Endoscopy;;  . ESOPHAGOGASTRODUODENOSCOPY  08/23/2008   EGB:TDVV distal esophageal erosions consistent with mild erosive reflux esophagitis, otherwise unremarkable esophagus/ Tiny antral erosions of doubtful clinical significance, otherwise normal stomach, patent pylorus, normal D1 and D2  . ESOPHAGOGASTRODUODENOSCOPY  08/06/11   small hh, noncritical Schatzki's ring s/p 71 F  . ESOPHAGOGASTRODUODENOSCOPY N/A 04/17/2015   OHY:WVPXTG s/p dilation  . ESOPHAGOSCOPY  03/15/2012   Procedure: ESOPHAGOSCOPY;  Surgeon: [name], MD;  Location: [place];  Service: ENT;  Laterality: N/A;  . KNEE ARTHROSCOPY  04/27/2001  . NM MYOCAR PERF WALL MOTION  2003   negative bruce protocol exercise tress test; EF 68%; intermediate risk study due to evidence of anterior wall ischemia extending from mid-ventricle to apex  . PITUITARY SURGERY  06/2012   benign tumor, surgeon at [facility]  . RECTOCELE REPAIR    . TRANSTHORACIC ECHOCARDIOGRAM  2003   EF normal  . VAGINAL PROLAPSE REPAIR    . VIDEO BRONCHOSCOPY Bilateral 03/08/2017   Procedure: VIDEO BRONCHOSCOPY WITHOUT FLUORO;  Surgeon: Javier Glazier, MD;  Location: [place];  Service: Cardiopulmonary;  Laterality: Bilateral;    ROS: Review of Systems Neg except as stated above PHYSICAL EXAM: BP 121/81   Pulse 80   Temp 98.5 F (36.9 C) (Oral)   Resp 16   Wt (!) 303 lb 9.6 oz (137.7 kg)   SpO2 98%   BMI 46.16 kg/m   Physical Exam  General appearance - alert, well appearing, and in no distress Mental status - alert, oriented to person, place, and time Pelvic - no external vaginal lesions  Results for orders placed or performed in visit on 04/26/17  POCT urinalysis dipstick  Result Value Ref  Range   Color, UA yellow     Clarity, UA clear    Glucose, UA negative    Bilirubin, UA negative    Ketones, UA negative    Spec Grav, UA 1.010 1.010 - 1.025   Blood, UA trace    pH, UA 6.5 5.0 - 8.0   Protein, UA negative    Urobilinogen, UA 0.2 0.2 or 1.0 E.U./dL   Nitrite, UA negative    Leukocytes, UA Negative Negative      ASSESSMENT AND PLAN: 1. Frequent urination - POCT urinalysis dipstick  2. Dysuria UA dip negative but will still try with Nitrofurantoin - Cervicovaginal ancillary only  3. Vaginal itching -treat empirically for yeast.  - Cervicovaginal ancillary only   Patient was given the opportunity to ask questions.  Patient verbalized understanding of the plan and was able to repeat key elements of the plan.   Orders Placed This Encounter  Procedures  . POCT urinalysis dipstick     Requested Prescriptions   Signed Prescriptions Disp Refills  . fluconazole (DIFLUCAN) 150 MG tablet 1 tablet 0    Sig: Take 1 tablet (150 mg total) by mouth once.  . nitrofurantoin, macrocrystal-monohydrate, (MACROBID) 100 MG capsule 10 capsule 0    Sig: Take 1 capsule (100 mg total) by mouth 2 (two) times daily.    No Follow-up on file.  Karle Plumber, MD, FACP

## 2017-04-27 MED FILL — NITROFURANTOIN MONO-MCR 100: 100 | 5 days supply | Qty: 10 | Fill #0

## 2017-04-28 LAB — CERVICOVAGINAL ANCILLARY ONLY: Candida vaginitis: POSITIVE — AB

## 2017-04-30 ENCOUNTER — Ambulatory Visit
Admission: RE | Admit: 2017-04-30 | Discharge: 2017-04-30 | Disposition: A | Discharge status: Home / Self Care | Payer: Medicaid Other | Source: Ambulatory Visit | Attending: Otolaryngology | Admitting: Otolaryngology

## 2017-04-30 DIAGNOSIS — R1314 Dysphagia, pharyngoesophageal phase: Secondary | ICD-10-CM

## 2017-05-04 MED FILL — AMLODIPINE BESYLATE 10 MG T: 10 | 30 days supply | Qty: 30 | Fill #7

## 2017-05-04 MED FILL — metFORMIN HCL 500 MG TABS: 500 | 30 days supply | Qty: 15 | Fill #4

## 2017-05-05 ENCOUNTER — Telehealth: Payer: Self-pay

## 2017-05-05 DIAGNOSIS — N898 Other specified noninflammatory disorders of vagina: Secondary | ICD-10-CM

## 2017-05-05 NOTE — Telephone Encounter (Signed)
Pt came into the office today and is requesting a referral to [facility]. Pt states she is having some female issues. Pt states she is having pain. Pt states she used to go to there before

## 2017-05-07 ENCOUNTER — Telehealth: Payer: Self-pay

## 2017-05-07 NOTE — Telephone Encounter (Addendum)
PC placed to pt today to f/u on this message.  Pt requesting GYN referral because she has been having problems with irritated feeling in vaginal area ever since she had a "bladder tuck" a few yrs ago.  Had recent exam here in office and test + for yeast.  Treated with Diflucan.   Referral will be submitted.

## 2017-05-07 NOTE — Telephone Encounter (Signed)
Contacted pt to go over lab results pt didn't answer lvm asking pt to give me a call at her earliest convienence   If pt calls back please give results: she has vaginal yeast infection. I gave rxn for Fluconazole already to treat. Also ask pt has she already pickup and taken rx.

## 2017-05-07 NOTE — Addendum Note (Signed)
Addended by: Karle Plumber B on: 05/07/2017 01:27 PM   Modules accepted: Orders

## 2017-05-10 MED FILL — LEVOTHYROXINE 88 MCG TABLET: 88 | 30 days supply | Qty: 30 | Fill #7

## 2017-05-10 MED FILL — hydrALAZINE HCL 25 MG TABS: 25 | 30 days supply | Qty: 90 | Fill #3

## 2017-05-10 MED FILL — LINZESS 290 MCG CAPSULE: 290 | 30 days supply | Qty: 30 | Fill #3

## 2017-05-10 MED FILL — BUPROPION HCL XL 300 MG TAB: 300 | 30 days supply | Qty: 30 | Fill #1

## 2017-05-12 MED FILL — GABAPENTIN 300 MG CAPSULE: 300 | 30 days supply | Qty: 180 | Fill #1

## 2017-05-12 MED FILL — LOSARTAN-HCTZ 100-25 MG TAB: 100-25 | 30 days supply | Qty: 30 | Fill #7

## 2017-05-24 MED FILL — MONTELUKAST SOD 10 MG TAB: 10 | 30 days supply | Qty: 30 | Fill #3

## 2017-05-24 MED FILL — raNITIdine HCL 150 MG TABS: 150 | 30 days supply | Qty: 30 | Fill #3

## 2017-05-24 MED FILL — BENZONATATE 100 MG CAPSULE: 100 | 20 days supply | Qty: 60 | Fill #2

## 2017-05-25 ENCOUNTER — Ambulatory Visit: Payer: Medicaid Other | Admitting: Internal Medicine

## 2017-05-27 ENCOUNTER — Encounter: Payer: Self-pay | Admitting: Family Medicine

## 2017-05-27 MED FILL — AMITRIPTYLINE HCL 50 MG TAB: 50 | 30 days supply | Qty: 30 | Fill #5

## 2017-05-28 ENCOUNTER — Encounter: Payer: Self-pay | Admitting: Internal Medicine

## 2017-05-28 ENCOUNTER — Ambulatory Visit: Payer: Medicaid Other | Attending: Internal Medicine | Admitting: Internal Medicine

## 2017-05-28 VITALS — BP 121/81 | HR 89 | Temp 98.3°F | Resp 16 | Wt 303.6 lb

## 2017-05-28 DIAGNOSIS — M797 Fibromyalgia: Secondary | ICD-10-CM | POA: Diagnosis not present

## 2017-05-28 DIAGNOSIS — G4733 Obstructive sleep apnea (adult) (pediatric): Secondary | ICD-10-CM

## 2017-05-28 DIAGNOSIS — K219 Gastro-esophageal reflux disease without esophagitis: Secondary | ICD-10-CM | POA: Diagnosis not present

## 2017-05-28 DIAGNOSIS — D509 Iron deficiency anemia, unspecified: Secondary | ICD-10-CM | POA: Diagnosis not present

## 2017-05-28 DIAGNOSIS — M79642 Pain in left hand: Secondary | ICD-10-CM

## 2017-05-28 DIAGNOSIS — Z9989 Dependence on other enabling machines and devices: Secondary | ICD-10-CM | POA: Diagnosis not present

## 2017-05-28 DIAGNOSIS — R042 Hemoptysis: Secondary | ICD-10-CM | POA: Diagnosis not present

## 2017-05-28 DIAGNOSIS — J309 Allergic rhinitis, unspecified: Secondary | ICD-10-CM | POA: Diagnosis not present

## 2017-05-28 DIAGNOSIS — R7303 Prediabetes: Secondary | ICD-10-CM

## 2017-05-28 DIAGNOSIS — E039 Hypothyroidism, unspecified: Secondary | ICD-10-CM | POA: Diagnosis not present

## 2017-05-28 DIAGNOSIS — Z87891 Personal history of nicotine dependence: Secondary | ICD-10-CM | POA: Diagnosis not present

## 2017-05-28 DIAGNOSIS — E063 Autoimmune thyroiditis: Secondary | ICD-10-CM | POA: Diagnosis not present

## 2017-05-28 DIAGNOSIS — M79641 Pain in right hand: Secondary | ICD-10-CM

## 2017-05-28 DIAGNOSIS — Z862 Personal history of diseases of the blood and blood-forming organs and certain disorders involving the immune mechanism: Secondary | ICD-10-CM | POA: Diagnosis not present

## 2017-05-28 DIAGNOSIS — Z7982 Long term (current) use of aspirin: Secondary | ICD-10-CM | POA: Diagnosis not present

## 2017-05-28 DIAGNOSIS — Z7984 Long term (current) use of oral hypoglycemic drugs: Secondary | ICD-10-CM | POA: Insufficient documentation

## 2017-05-28 DIAGNOSIS — R0609 Other forms of dyspnea: Secondary | ICD-10-CM | POA: Diagnosis not present

## 2017-05-28 DIAGNOSIS — K449 Diaphragmatic hernia without obstruction or gangrene: Secondary | ICD-10-CM | POA: Insufficient documentation

## 2017-05-28 DIAGNOSIS — I10 Essential (primary) hypertension: Secondary | ICD-10-CM

## 2017-05-28 DIAGNOSIS — R600 Localized edema: Secondary | ICD-10-CM | POA: Diagnosis not present

## 2017-05-28 DIAGNOSIS — F329 Major depressive disorder, single episode, unspecified: Secondary | ICD-10-CM | POA: Insufficient documentation

## 2017-05-28 DIAGNOSIS — F32A Depression, unspecified: Secondary | ICD-10-CM

## 2017-05-28 LAB — GLUCOSE, POCT (MANUAL RESULT ENTRY): POC Glucose: 117 mg/dl — AB (ref 70–99)

## 2017-05-28 MED ORDER — DICLOFENAC SODIUM 1 % TD GEL: 2.0000 g | Freq: Four times a day (QID) | TRANSDERMAL | 2 refills | Status: DC

## 2017-05-28 MED FILL — PANTOPRAZOLE SOD DR 40 MG T: 40 | 30 days supply | Qty: 60 | Fill #4

## 2017-05-28 NOTE — Patient Instructions (Addendum)
Limit salt in foods.   Try to get in exercise as tolerated. Water aerobics offer at [employer]. Food Choices for Gastroesophageal Reflux Disease, Adult When you have gastroesophageal reflux disease (GERD), the foods you eat and your eating habits are very important. Choosing the right foods can help ease your discomfort. What guidelines do I need to follow?  Choose fruits, vegetables, whole grains, and low-fat dairy products.  Choose low-fat meat, fish, and poultry.  Limit fats such as oils, salad dressings, butter, nuts, and avocado.  Keep a food diary. This helps you identify foods that cause symptoms.  Avoid foods that cause symptoms. These may be different for everyone.  Eat small meals often instead of 3 large meals a day.  Eat your meals slowly, in a place where you are relaxed.  Limit fried foods.  Cook foods using methods other than frying.  Avoid drinking alcohol.  Avoid drinking large amounts of liquids with your meals.  Avoid bending over or lying down until 2-3 hours after eating. What foods are not recommended? These are some foods and drinks that may make your symptoms worse: Vegetables Tomatoes. Tomato juice. Tomato and spaghetti sauce. Chili peppers. Onion and garlic. Horseradish. Fruits Oranges, grapefruit, and lemon (fruit and juice). Meats High-fat meats, fish, and poultry. This includes hot dogs, ribs, ham, sausage, salami, and bacon. Dairy Whole milk and chocolate milk. Sour cream. Cream. Butter. Ice cream. Cream cheese. Drinks Coffee and tea. Bubbly (carbonated) drinks or energy drinks. Condiments Hot sauce. Barbecue sauce. Sweets/Desserts Chocolate and cocoa. Donuts. Peppermint and spearmint. Fats and Oils High-fat foods. This includes [place] fries and potato chips. Other Vinegar. Strong spices. This includes black pepper, white pepper, red pepper, cayenne, curry powder, cloves, ginger, and chili powder. The items listed above may not be a complete  list of foods and drinks to avoid. Contact your dietitian for more information. This information is not intended to replace advice given to you by your health care provider. Make sure you discuss any questions you have with your health care provider. Document Released: 03/22/2012 Document Revised: 02/27/2016 Document Reviewed: 07/26/2013 Elsevier Interactive Patient Education  2017 [employer].

## 2017-05-28 NOTE — Progress Notes (Signed)
Patient ID: Robin [name], female    DOB: 1955/10/24  MRN: [id number]  CC: Follow-up   Subjective: Robin [name] is a 61 y.o. female who presents for chronic ds management Her concerns today include:  Patient with history of hypertension, pre-diabetes, obstructive sleep apnea, fibromyalgia, depression, obesity,  GERD, hypothyroid.   1. Recent DOE and hemoptysis.  Pulmonary work up revealed superficial abrasion on her tonsil.  Referred to ENT -seen at [facility] Dr. Erik Obey -had CT soft tiss neck which revealed no inflammatory change or foreign body in the pharynx.   esophagram revealed small hiatal hernia with mild reflux  2.Referred to GYN. Wants to go to [place] -plans to have pap done there  3. OSA: -has CPAP machine but not used for 4 yrs.  Need new hose.  -last sleep study 2-3 yrs ago -+ daytime tiredness and snores lounds  4. Depression: never saw therapist in past -Celexa, Wellbutrin, and Elavil on med list but not taking Celexa.  "Made me feel out of it." .   5. Off Iron x 1 yr. Hx of iron def Last H/H stable at 12/38  6. HTN: compliant with meds- amlodipine, hydralazine and Cozaar/HCTZ -having some swelling in feet and legs x 2-3 wks Limits salt  7. Fibromyalgia -still flares up. On Gabapentin and Tramadol which she reports taking about 2 x a day. Helps.  -sleeping OK -not getting in much exercise. "My feet and knees hurt." -signed up for study at [facility] about wgh management  8. "Hands hurt all the time." -+stiffness and swelling around wrist. Last about 15 mins in the mornings.  She constantly has to move and exercise fingers/wrist in the a.m to loosen them up -no fever or wgh loss. Request referral to Rheumatologist -thinks dad had RA  Patient Active Problem List   Diagnosis Date Noted  . Prediabetes 02/04/2017  . Chronic allergic rhinitis 12/23/2016  . Hemoptysis 12/23/2016  . Fibromyalgia 08/18/2016  . Chronic lymphocytic thyroiditis  08/18/2016  . Depression 04/01/2016  . OSA (obstructive sleep apnea) 04/01/2016  . HTN (hypertension), benign 12/09/2015  . OA (osteoarthritis) of knee 12/09/2015  . Hypothyroidism 12/09/2015  . Morbid obesity ([place]) 12/09/2015  . Constipation 05/27/2015  . Fatty liver 05/27/2015  . Abnormal small bowel biopsy 09/18/2011  . Lactose intolerance 09/18/2011  . Dysuria 06/25/2011  . GERD 01/21/2010     Current Outpatient Prescriptions on File Prior to Visit  Medication Sig Dispense Refill  . acetaminophen (TYLENOL) 500 MG tablet Take 1 tablet (500 mg total) by mouth every 6 (six) hours as needed for headache. (Patient taking differently: Take 1,000 mg by mouth every 6 (six) hours as needed for headache. ) 30 tablet 0  . amitriptyline (ELAVIL) 50 MG tablet Take 1 tablet (50 mg total) by mouth at bedtime. 60 tablet 3  . amLODipine (NORVASC) 10 MG tablet Take 1 tablet (10 mg total) by mouth daily. 90 tablet 3  . aspirin 81 MG tablet Take 1 tablet (81 mg total) by mouth at bedtime. Reported on 12/09/2015 90 tablet 3  . buPROPion (WELLBUTRIN XL) 300 MG 24 hr tablet Take 1 tablet (300 mg total) by mouth every morning. Reported on 12/09/2015 90 tablet 3  . fexofenadine (ALLEGRA) 180 MG tablet Take 1 tablet (180 mg total) by mouth daily. Reported on 12/09/2015 30 tablet 3  . fluticasone (FLONASE) 50 MCG/ACT nasal spray Place 2 sprays into both nostrils daily. 16 g 6  . gabapentin (NEURONTIN) 300  MG capsule Take 2 capsules (600 mg total) by mouth 3 (three) times daily. 180 capsule 5  . hydrALAZINE (APRESOLINE) 25 MG tablet Take 1 tablet (25 mg total) by mouth 3 (three) times daily. (Patient taking differently: Take 25 mg by mouth 2 (two) times daily. ) 90 tablet 3  . ipratropium (ATROVENT) 0.06 % nasal spray Place 2 sprays into both nostrils 4 (four) times daily. (Patient taking differently: Place 1 spray into both nostrils 4 (four) times daily. ) 15 mL 1  . levothyroxine (SYNTHROID, LEVOTHROID) 88 MCG tablet  Take 1 tablet (88 mcg total) by mouth daily. 90 tablet 2  . linaclotide (LINZESS) 290 MCG CAPS capsule Take 1 capsule (290 mcg total) by mouth daily before breakfast. 30 capsule 5  . loratadine (CLARITIN) 10 MG tablet Take 1 tablet (10 mg total) by mouth daily. (Patient taking differently: Take 10 mg by mouth daily as needed for allergies. ) 30 tablet 11  . losartan-hydrochlorothiazide (HYZAAR) 100-25 MG tablet Take 1 tablet by mouth daily. Reported on 12/09/2015 90 tablet 2  . metFORMIN (GLUCOPHAGE) 500 MG tablet Take 0.5 tablets (250 mg total) by mouth daily with breakfast. 45 tablet 3  . methocarbamol (ROBAXIN) 500 MG tablet Take 1 tablet (500 mg total) by mouth every 8 (eight) hours as needed for muscle spasms. (Patient taking differently: Take 500 mg by mouth at bedtime as needed for muscle spasms. ) 60 tablet 1  . montelukast (SINGULAIR) 10 MG tablet Take 1 tablet (10 mg total) by mouth at bedtime. 30 tablet 3  . Multiple Vitamin (MULTIVITAMIN) tablet Take 1 tablet by mouth daily. Reported on 12/09/2015 90 tablet 1  . naproxen (NAPROSYN) 500 MG tablet Take 1 tablet (500 mg total) by mouth 2 (two) times daily with a meal. (Patient taking differently: Take 500 mg by mouth daily. ) 30 tablet 0  . nitrofurantoin, macrocrystal-monohydrate, (MACROBID) 100 MG capsule Take 1 capsule (100 mg total) by mouth 2 (two) times daily. 10 capsule 0  . Omega-3 Fatty Acids (FISH OIL PO) Take 1 capsule by mouth at bedtime. Reported on 12/09/2015    . pantoprazole (PROTONIX) 40 MG tablet Take 1 tablet (40 mg total) by mouth 2 (two) times daily before a meal. 60 tablet 5  . polyethylene glycol powder (GLYCOLAX/MIRALAX) powder Take 17 g by mouth 2 (two) times daily as needed. 3350 g 1  . ranitidine (ZANTAC) 150 MG tablet TAKE 1 TABLET BY MOUTH AT BEDTIME. 30 tablet 5  . traMADol (ULTRAM) 50 MG tablet Take 1 tablet (50 mg total) by mouth 2 (two) times daily as needed. 40 tablet 0   No current facility-administered  medications on file prior to visit.     Allergies  Allergen Reactions  . Celexa [Citalopram Hydrobromide] Other (See Comments)    "Made me feel out of it"    Social History   Social History  . Marital status: Married    Spouse name: N/A  . Number of children: 4  . Years of education: 10   Occupational History  .  Unemployed   Social History Main Topics  . Smoking status: Former Smoker    Packs/day: 0.50    Years: 10.00    Types: Cigarettes    Start date: 07/14/1975    Quit date: 04/04/1993  . Smokeless tobacco: Never Used     Comment: 17 yrs ago  . Alcohol use No  . Drug use: No  . Sexual activity: Not on file   Other Topics  Concern  . Not on file   Social History Narrative   Lives with husband in an apartment on the first floor.  Has 4 children.     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  Trying to get disability.  Formerly worked as a Teacher, early years/pre.  Education: 11th grade.      [facility] (12/23/16):   Originally from [place]. She was raised in [place]. 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 No pets currently. No bird exposure. She does have mold in her current home in the bathroom, laundry room, & master bedroom.     Family History  Problem Relation Age of Onset  . Kidney cancer Father   . Hypertension Father   . Hyperlipidemia Father   . Diabetes Mother   . Hypertension Mother   . Heart Problems Mother   . Hyperlipidemia Mother   . Asthma Mother   . Liver disease Brother   . Arthritis Brother   . Hypertension Sister   . Allergic rhinitis Sister   . Stomach cancer Unknown        aunt  . Breast cancer Maternal Grandmother   . Kidney disease Maternal Grandfather   . Breast cancer Paternal Grandmother   . Hypertension Brother   . Hyperlipidemia Brother   . Hypertension Brother   . Hypertension  Sister   . Hyperlipidemia Sister   . Lupus Maternal Aunt   . Colon cancer Neg Hx   . Eczema Neg Hx   . Immunodeficiency Neg Hx   . Urticaria Neg Hx     Past Surgical History:  Procedure Laterality Date  . ABDOMINAL HYSTERECTOMY    . ABDOMINAL HYSTERECTOMY  02/18/2000  . CARDIAC CATHETERIZATION  08/17/2001   normal L main/LAD/L Cfx/RCA (Dr. Adora Fridge)  . COLONOSCOPY  2006   Dr. [name], hyperplastic polyps  . COLONOSCOPY  08/06/11   abnormal terminal ileum for 10cm, erosions, geographical ulceration. Bx small bowel mucosa with prominent intramucosal lymphoid aggregates, slightly inflammed  . DIRECT LARYNGOSCOPY  03/15/2012   Procedure: DIRECT LARYNGOSCOPY;  Surgeon: [name], MD;  Location: [place];  Service: ENT;  Laterality: N/A; Dr. Wolicki--:>no foreign body seen. normal esophagus to 40cm  . ESOPHAGEAL DILATION  04/17/2015   Procedure: ESOPHAGEAL DILATION;  Surgeon: [name], MD;  [facility];  Service: Endoscopy;;  . ESOPHAGOGASTRODUODENOSCOPY  08/23/2008   NKN:LZJQ distal esophageal erosions consistent with mild erosive reflux esophagitis, otherwise unremarkable esophagus/ Tiny antral erosions of doubtful clinical significance, otherwise normal stomach, patent pylorus, normal D1 and D2  . ESOPHAGOGASTRODUODENOSCOPY  08/06/11   small hh, noncritical Schatzki's ring s/p 60 F  . ESOPHAGOGASTRODUODENOSCOPY N/A 04/17/2015   BHA:LPFXTK s/p dilation  . ESOPHAGOSCOPY  03/15/2012   Procedure: ESOPHAGOSCOPY;  Surgeon: [name], MD;  Location: [place];  Service: ENT;  Laterality: N/A;  . KNEE ARTHROSCOPY  04/27/2001  . NM MYOCAR PERF WALL MOTION  2003   negative bruce protocol exercise tress test; EF 68%; intermediate risk study due to evidence of anterior wall ischemia extending from mid-ventricle to apex  . 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 RECTOCELE REPAIR    . TRANSTHORACIC ECHOCARDIOGRAM  2003   EF normal  . VAGINAL PROLAPSE  REPAIR    .  VIDEO BRONCHOSCOPY Bilateral 03/08/2017   Procedure: VIDEO BRONCHOSCOPY WITHOUT FLUORO;  Surgeon: Javier Glazier, MD;  Location: [place];  Service: Cardiopulmonary;  Laterality: Bilateral;    ROS: Review of Systems Negative except as stated above PHYSICAL EXAM: BP 121/81   Pulse 89   Temp 98.3 F (36.8 C) (Oral)   Resp 16   Wt (!) 303 lb 9.6 oz (137.7 kg)   SpO2 97%   BMI 46.16 kg/m   Wt Readings from Last 3 Encounters:  05/28/17 (!) 303 lb 9.6 oz (137.7 kg)  04/26/17 (!) 303 lb 9.6 oz (137.7 kg)  03/25/17 (!) 303 lb (137.4 kg)    Physical Exam General appearance - alert, well appearing, Middle-aged obese African-American female and in no distress Mental status - alert, oriented to person, place, and time, normal mood, behavior, speech, dress, motor activity, and thought processes Mouth - mucous membranes moist, pharynx normal without lesions Neck - supple, no significant adenopathy Chest - clear to auscultation, no wheezes, rales or rhonchi, symmetric air entry Heart - normal rate, regular rhythm, normal S1, S2, no murmurs, rubs, clicks or gallops Musculoskeletal -mild enlargement of PIP joints. No inflammation at the wrists. Good range of motion of the finger joints and wrists. Extremities - trace to 1+ bilateral lower extremity edema   Results for orders placed or performed in visit on 05/28/17  POCT glucose (manual entry)  Result Value Ref Range   POC Glucose 117 (A) 70 - 99 mg/dl   Lab Results  Component Value Date   TSH 1.89 08/05/2016    ASSESSMENT AND PLAN: 1. Essential hypertension At goal. Continue current medications and DASH diet  2. Prediabetes Continue to encourage healthy eating habits. Strongly advised some form of exercise as tolerated. Consider aquatic therapy - POCT glucose (manual entry)  3. Gastroesophageal reflux disease without esophagitis -GERD precautions discussed including foods to avoid, eating last meal at least 3  hours before laying down at nights and sleeping with her head slightly elevated  4. OSA on CPAP -Encouraged use of CPAP machine. Prescription given for hoses  5. Depression, unspecified depression type Doing well on Wellbutrin and Elavil.  6. Hx of iron deficiency anemia Stable off of iron supplement  7. Fibromyalgia -Stressed the importance of increased physical activity. Symptoms to be doing well with gabapentin and tramadol. Patient advised that tramadol is a controlled substance that can become habit forming leading to dependence and sometimes addiction. Pain management agreement signed for Tramadol   8. Bilateral hand pain - diclofenac sodium (VOLTAREN) 1 % GEL; Apply 2 g topically 4 (four) times daily.  Dispense: 100 g; Refill: 2 - Rheumatoid factor - CYCLIC CITRUL PEPTIDE ANTIBODY, IGG/IGA - DG Hand Complete Left; Future - DG Hand Complete Right; Future - Sedimentation Rate  9. Lower extremity edema -Advised low-salt diet. Informed that amlodipine and gabapentin can cause lower extremity edema. She is not amendable to trying other medication or decreasing the dose of gabapentin. Wi'll check proBNP and TSH - Brain natriuretic peptide  10. Hypothyroidism, unspecified type - TSH  She will have pap done when she sees GYN  Patient was given the opportunity to ask questions.  Patient verbalized understanding of the plan and was able to repeat key elements of the plan.   Orders Placed This Encounter  Procedures  . DG Hand Complete Left  . DG Hand Complete Right  . TSH  . Brain natriuretic peptide  . Rheumatoid factor  . CYCLIC CITRUL PEPTIDE ANTIBODY, IGG/IGA  .  Sedimentation Rate  . POCT glucose (manual entry)     Requested Prescriptions   Signed Prescriptions Disp Refills  . diclofenac sodium (VOLTAREN) 1 % GEL 100 g 2    Sig: Apply 2 g topically 4 (four) times daily.    Return in about 2 months (around 07/28/2017).  Karle Plumber, MD, FACP

## 2017-05-30 LAB — CYCLIC CITRUL PEPTIDE ANTIBODY, IGG/IGA: CYCLIC CITRULLIN PEPTIDE AB: 6 U (ref 0–19)

## 2017-05-30 LAB — BRAIN NATRIURETIC PEPTIDE: BNP: 6.1 pg/mL (ref 0.0–100.0)

## 2017-05-30 LAB — TSH: TSH: 1.06 u[IU]/mL (ref 0.450–4.500)

## 2017-05-30 LAB — RHEUMATOID FACTOR: Rhuematoid fact SerPl-aCnc: <10 IU/mL (ref 0.0–13.9)

## 2017-05-30 LAB — SEDIMENTATION RATE: SED RATE: 34 mm/h (ref 0–40)

## 2017-05-31 ENCOUNTER — Telehealth: Payer: Self-pay | Admitting: Internal Medicine

## 2017-05-31 ENCOUNTER — Other Ambulatory Visit: Payer: Self-pay | Admitting: Internal Medicine

## 2017-05-31 MED ORDER — TRAMADOL HCL 50 MG PO TABS: 50.0000 mg | ORAL_TABLET | Freq: Two times a day (BID) | ORAL | 1 refills | Status: DC | PRN

## 2017-05-31 MED FILL — AMLODIPINE BESYLATE 10 MG T: 10 | 30 days supply | Qty: 30 | Fill #8

## 2017-05-31 MED FILL — VOLTAREN 1% GEL: 1 | 12 days supply | Qty: 100 | Fill #0

## 2017-05-31 MED FILL — traMADol HCL 50 MG TABS: 50 | 20 days supply | Qty: 40 | Fill #0

## 2017-05-31 MED FILL — metFORMIN HCL 500 MG TABS: 500 | 30 days supply | Qty: 15 | Fill #5

## 2017-05-31 NOTE — Telephone Encounter (Signed)
Pt.called requesting a refill on Tramadol. Please f/u with pt.

## 2017-05-31 NOTE — Telephone Encounter (Signed)
Rxn for Tramadol printed and given to pt today.

## 2017-06-09 MED FILL — BUPROPION HCL XL 300 MG TAB: 300 | 30 days supply | Qty: 30 | Fill #2

## 2017-06-09 MED FILL — LEVOTHYROXINE 88 MCG TABLET: 88 | 30 days supply | Qty: 30 | Fill #8

## 2017-06-14 ENCOUNTER — Encounter (INDEPENDENT_AMBULATORY_CARE_PROVIDER_SITE_OTHER): Payer: Self-pay | Admitting: Orthopaedic Surgery

## 2017-06-14 ENCOUNTER — Telehealth: Payer: Self-pay | Admitting: Internal Medicine

## 2017-06-14 ENCOUNTER — Ambulatory Visit (INDEPENDENT_AMBULATORY_CARE_PROVIDER_SITE_OTHER): Payer: Medicaid Other | Admitting: Orthopaedic Surgery

## 2017-06-14 DIAGNOSIS — M1712 Unilateral primary osteoarthritis, left knee: Secondary | ICD-10-CM

## 2017-06-14 DIAGNOSIS — M1612 Unilateral primary osteoarthritis, left hip: Secondary | ICD-10-CM

## 2017-06-14 NOTE — Addendum Note (Signed)
Addended by: Precious Bard on: 06/14/2017 04:44 PM   Modules accepted: Orders

## 2017-06-14 NOTE — Telephone Encounter (Signed)
Returned pt call to go over lab results pt is aware and doesn't have any questions or concerns  

## 2017-06-14 NOTE — Telephone Encounter (Signed)
Pt called requesting lab results , please f/up °

## 2017-06-14 NOTE — Progress Notes (Signed)
Office Visit Note   Patient: Robin [name]           Date of Birth: 12-19-1955           MRN: [id number] Visit Date: 06/14/2017              Requested by: Ladell Pier, MD [address] [place], [place] [address] PCP: Ladell Pier, MD   Assessment & Plan: Visit Diagnoses:  1. Primary osteoarthritis of left hip   2. Unilateral primary osteoarthritis, left knee     Plan: Patient has advanced degenerative joint disease of both her left hip and knee. I think her increased BMI and soft tissue envelope around her left hip is still an issue when it comes to surgical wound complications or infection. She is also diabetic. Her left knee I think is quite reasonable for replacement in terms of surgical approach and the soft tissue envelope. She will let us she wants to proceed. I did give her my card. Referral to Dr. Leafy Ro and pain management clinic.  Follow-Up Instructions: Return if symptoms worsen or fail to improve.   Orders:  No orders of the defined types were placed in this encounter.  No orders of the defined types were placed in this encounter.     Procedures: No procedures performed   Clinical Data: No additional findings.   Subjective: Chief Complaint  Patient presents with  . Left Knee - Pain  . Left Hip - Pain    Patient is a very pleasant 61 year old female with left hip and knee pain with known advanced degenerative joint disease. I last saw her about 10 months ago this issue. In terms of her weight this has been essentially unchanged. She does have significant pain with her knee and hip that is affecting her quality of life and ADLs. Denies any numbness and tingling.    Review of Systems  Constitutional: Negative.   HENT: Negative.   Eyes: Negative.   Respiratory: Negative.   Cardiovascular: Negative.   Endocrine: Negative.   Musculoskeletal: Negative.   Neurological: Negative.   Hematological: Negative.   Psychiatric/Behavioral:  Negative.   All other systems reviewed and are negative.    Objective: Vital Signs: There were no vitals taken for this visit.  Physical Exam  Constitutional: She is oriented to person, place, and time. She appears well-developed and well-nourished.  Pulmonary/Chest: Effort normal.  Neurological: She is alert and oriented to person, place, and time.  Skin: Skin is warm. Capillary refill takes less than 2 seconds.  Psychiatric: She has a normal mood and affect. Her behavior is normal. Judgment and thought content normal.  Nursing note and vitals reviewed.   Ortho Exam Left hip exam shows large soft tissue envelope on the proximal thigh. Positive Stinchfield sign and pain with rotation of the hip.  Left knee exam is stable. No joint effusion. Specialty Comments:  No specialty comments available.  Imaging: No results found.   PMFS History: Patient Active Problem List   Diagnosis Date Noted  . Hx of iron deficiency anemia 05/28/2017  . Prediabetes 02/04/2017  . Chronic allergic rhinitis 12/23/2016  . Hemoptysis 12/23/2016  . Fibromyalgia 08/18/2016  . Chronic lymphocytic thyroiditis 08/18/2016  . Depression 04/01/2016  . OSA (obstructive sleep apnea) 04/01/2016  . HTN (hypertension), benign 12/09/2015  . OA (osteoarthritis) of knee 12/09/2015  . Hypothyroidism 12/09/2015  . Morbid obesity ([place]) 12/09/2015  . Constipation 05/27/2015  . Fatty liver 05/27/2015  . Abnormal small  bowel biopsy 09/18/2011  . Lactose intolerance 09/18/2011  . Dysuria 06/25/2011  . GERD 01/21/2010   Past Medical History:  Diagnosis Date  . Anxiety disorder   . Arthritis   . Chronic neck pain   . Constipation   . Diabetes mellitus without complication ([place])   . GERD (gastroesophageal reflux disease)   . Hiatal hernia    small  . Hyperlipidemia   . Hypertension   . Schatzki's ring    non critical  . Sleep apnea    CPAP, Sleep study at [place]    Family History   Problem Relation Age of Onset  . Kidney cancer Father   . Hypertension Father   . Hyperlipidemia Father   . Diabetes Mother   . Hypertension Mother   . Heart Problems Mother   . Hyperlipidemia Mother   . Asthma Mother   . Liver disease Brother   . Arthritis Brother   . Hypertension Sister   . Allergic rhinitis Sister   . Stomach cancer Unknown        aunt  . Breast cancer Maternal Grandmother   . Kidney disease Maternal Grandfather   . Breast cancer Paternal Grandmother   . Hypertension Brother   . Hyperlipidemia Brother   . Hypertension Brother   . Hypertension Sister   . Hyperlipidemia Sister   . Lupus Maternal Aunt   . Colon cancer Neg Hx   . Eczema Neg Hx   . Immunodeficiency Neg Hx   . Urticaria Neg Hx     Past Surgical History:  Procedure Laterality Date  . ABDOMINAL HYSTERECTOMY    . ABDOMINAL HYSTERECTOMY  02/18/2000  . CARDIAC CATHETERIZATION  08/17/2001   normal L main/LAD/L Cfx/RCA (Dr. Adora Fridge)  . COLONOSCOPY  2006   Dr. [name], hyperplastic polyps  . COLONOSCOPY  08/06/11   abnormal terminal ileum for 10cm, erosions, geographical ulceration. Bx small bowel mucosa with prominent intramucosal lymphoid aggregates, slightly inflammed  . DIRECT LARYNGOSCOPY  03/15/2012   Procedure: DIRECT LARYNGOSCOPY;  Surgeon: [name], MD;  Location: [place];  Service: ENT;  Laterality: N/A; Dr. Wolicki--:>no foreign body seen. normal esophagus to 40cm  . ESOPHAGEAL DILATION  04/17/2015   Procedure: ESOPHAGEAL DILATION;  Surgeon: [name], MD;  [facility];  Service: Endoscopy;;  . ESOPHAGOGASTRODUODENOSCOPY  08/23/2008   BDZ:HGDJ distal esophageal erosions consistent with mild erosive reflux esophagitis, otherwise unremarkable esophagus/ Tiny antral erosions of doubtful clinical significance, otherwise normal stomach, patent pylorus, normal D1 and D2  . ESOPHAGOGASTRODUODENOSCOPY  08/06/11   small hh, noncritical Schatzki's ring s/p 60 F  .  ESOPHAGOGASTRODUODENOSCOPY N/A 04/17/2015   MEQ:ASTMHD s/p dilation  . ESOPHAGOSCOPY  03/15/2012   Procedure: ESOPHAGOSCOPY;  Surgeon: [name], MD;  Location: [place];  Service: ENT;  Laterality: N/A;  . KNEE ARTHROSCOPY  04/27/2001  . NM MYOCAR PERF WALL MOTION  2003   negative bruce protocol exercise tress test; EF 68%; intermediate risk study due to evidence of anterior wall ischemia extending from mid-ventricle to apex  . 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 RECTOCELE REPAIR    . TRANSTHORACIC ECHOCARDIOGRAM  2003   EF normal  . VAGINAL PROLAPSE REPAIR    . VIDEO BRONCHOSCOPY Bilateral 03/08/2017   Procedure: VIDEO BRONCHOSCOPY WITHOUT FLUORO;  Surgeon: Javier Glazier, MD;  Location: [place];  Service: Cardiopulmonary;  Laterality: Bilateral;   Social History   Occupational History  .  Unemployed   Social History Main Topics  . Smoking status: Former Smoker    Packs/day: 0.50    Years: 10.00    Types: Cigarettes    Start date: 07/14/1975    Quit date: 04/04/1993  . Smokeless tobacco: Never Used     Comment: 17 yrs ago  . Alcohol use No  . Drug use: No  . Sexual activity: Not on file

## 2017-06-16 ENCOUNTER — Encounter: Payer: Self-pay | Admitting: Gastroenterology

## 2017-06-16 ENCOUNTER — Ambulatory Visit (INDEPENDENT_AMBULATORY_CARE_PROVIDER_SITE_OTHER): Payer: Medicaid Other | Admitting: Gastroenterology

## 2017-06-16 ENCOUNTER — Other Ambulatory Visit: Payer: Self-pay

## 2017-06-16 VITALS — BP 149/70 | HR 83 | Temp 97.1°F | Ht 68.0 in | Wt 308.2 lb

## 2017-06-16 DIAGNOSIS — K760 Fatty (change of) liver, not elsewhere classified: Secondary | ICD-10-CM | POA: Diagnosis not present

## 2017-06-16 DIAGNOSIS — R110 Nausea: Secondary | ICD-10-CM | POA: Diagnosis not present

## 2017-06-16 DIAGNOSIS — R6881 Early satiety: Secondary | ICD-10-CM | POA: Diagnosis not present

## 2017-06-16 NOTE — Progress Notes (Signed)
Primary Care Physician: Ladell Pier, MD  Primary Gastroenterologist:  Garfield Cornea, MD   Chief Complaint  Patient presents with  . Abdominal Pain    f/u; feels full  . Constipation    some better    HPI: Robin [name] is a 61 y.o. female here for follow up. Last seen in 01/2017. Complained of reflux/nausea, upper abd discomfort, constipation. She has been struggling with her weight. Knees left hip and left knee replacement but surgery on hold until she can loose weight. She is going to see a weight loss provider in near future. Has gained 10 pounds this year and 5 pounds since August.  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 Mildly prominent mesenteric lymph nodes, presumed reactive.   Feels full all the time. Feels sick on stomach after meals. Complains of early satiety. Stomach hurts, especially upper abd. Constipation some better. Has BM every day on Linzess. No melena, brbpr.   Going to go to pain management.     Current Outpatient Prescriptions  Medication Sig Dispense Refill  . acetaminophen (TYLENOL) 500 MG tablet Take 1 tablet (500 mg total) by mouth every 6 (six) hours as needed for headache. (Patient taking differently: Take 1,000 mg by mouth every 6 (six) hours as needed for headache. ) 30 tablet 0  . amitriptyline (ELAVIL) 50 MG tablet Take 1 tablet (50 mg total) by mouth at bedtime. 60 tablet 3  . amLODipine (NORVASC) 10 MG tablet Take 1 tablet (10 mg total) by mouth daily. 90 tablet 3  . aspirin 81 MG tablet Take 1 tablet (81 mg total) by mouth at bedtime. Reported on 12/09/2015 90 tablet 3  . buPROPion (WELLBUTRIN XL) 300 MG 24 hr tablet Take 1 tablet (300 mg total) by mouth every morning. Reported on 12/09/2015 90 tablet 3  . diclofenac sodium (VOLTAREN) 1 % GEL Apply 2 g topically 4 (four) times daily. 100 g 2  . fexofenadine (ALLEGRA) 180 MG tablet Take 1 tablet (180 mg total)  by mouth daily. Reported on 12/09/2015 30 tablet 3  . fluticasone (FLONASE) 50 MCG/ACT nasal spray Place 2 sprays into both nostrils daily. 16 g 6  . gabapentin (NEURONTIN) 300 MG capsule Take 2 capsules (600 mg total) by mouth 3 (three) times daily. 180 capsule 5  . hydrALAZINE (APRESOLINE) 25 MG tablet Take 1 tablet (25 mg total) by mouth 3 (three) times daily. (Patient taking differently: Take 25 mg by mouth 2 (two) times daily. ) 90 tablet 3  . ipratropium (ATROVENT) 0.06 % nasal spray Place 2 sprays into both nostrils 4 (four) times daily. (Patient taking differently: Place 1 spray into both nostrils 4 (four) times daily. ) 15 mL 1  . levothyroxine (SYNTHROID, LEVOTHROID) 88 MCG tablet Take 1 tablet (88 mcg total) by mouth daily. 90 tablet 2  . linaclotide (LINZESS) 290 MCG CAPS capsule Take 1 capsule (290 mcg total) by mouth daily before breakfast. 30 capsule 5  . loratadine (CLARITIN) 10 MG tablet Take 1 tablet (10 mg total) by mouth daily. (Patient taking differently: Take 10 mg by mouth daily as needed for allergies. ) 30 tablet 11  . losartan-hydrochlorothiazide (HYZAAR) 100-25 MG tablet Take 1 tablet by mouth daily. Reported on 12/09/2015 90 tablet 2  . metFORMIN (GLUCOPHAGE) 500 MG tablet Take 0.5 tablets (250 mg total) by mouth daily with breakfast. 45 tablet 3  . methocarbamol (ROBAXIN) 500 MG  tablet Take 1 tablet (500 mg total) by mouth every 8 (eight) hours as needed for muscle spasms. (Patient taking differently: Take 500 mg by mouth at bedtime as needed for muscle spasms. ) 60 tablet 1  . montelukast (SINGULAIR) 10 MG tablet Take 1 tablet (10 mg total) by mouth at bedtime. 30 tablet 3  . Multiple Vitamin (MULTIVITAMIN) tablet Take 1 tablet by mouth daily. Reported on 12/09/2015 90 tablet 1  . naproxen (NAPROSYN) 500 MG tablet Take 1 tablet (500 mg total) by mouth 2 (two) times daily with a meal. (Patient taking differently: Take 500 mg by mouth daily. ) 30 tablet 0  . pantoprazole  (PROTONIX) 40 MG tablet Take 1 tablet (40 mg total) by mouth 2 (two) times daily before a meal. 60 tablet 5  . ranitidine (ZANTAC) 150 MG tablet TAKE 1 TABLET BY MOUTH AT BEDTIME. 30 tablet 5  . traMADol (ULTRAM) 50 MG tablet Take 1 tablet (50 mg total) by mouth 2 (two) times daily as needed. 40 tablet 1   No current facility-administered medications for this visit.     Allergies as of 06/16/2017 - Review Complete 06/16/2017  Allergen Reaction Noted  . Celexa [citalopram hydrobromide] Other (See Comments) 05/28/2017    ROS:  General: Negative for anorexia, weight loss, fever, chills, fatigue, weakness. ENT: Negative for hoarseness, difficulty swallowing , nasal congestion. CV: Negative for chest pain, angina, palpitations, dyspnea on exertion, peripheral edema.  Respiratory: Negative for dyspnea at rest, dyspnea on exertion, cough, sputum, wheezing.  GI: See history of present illness. GU:  Negative for dysuria, hematuria, urinary incontinence, urinary frequency, nocturnal urination.  Endo: Negative for unusual weight change.    Physical Examination:   BP (!) 149/70   Pulse 83   Temp (!) 97.1 F (36.2 C) (Oral)   Ht 5\' 8"  (1.727 m)   Wt (!) 308 lb 3.2 oz (139.8 kg)   BMI 46.86 kg/m   General: Well-nourished, well-developed in no acute distress.  Eyes: No icterus. Mouth: Oropharyngeal mucosa moist and pink , no lesions erythema or exudate. Lungs: Clear to auscultation bilaterally.  Heart: Regular rate and rhythm, no murmurs rubs or gallops.  Abdomen: Bowel sounds are normal, nontender, nondistended, no hepatosplenomegaly or masses, no abdominal bruits or hernia , no rebound or guarding.   Extremities: No lower extremity edema. No clubbing or deformities. Neuro: Alert and oriented x 4   Skin: Warm and dry, no jaundice.   Psych: Alert and cooperative, normal mood and affect.  Labs:  Lab Results  Component Value Date   WBC 5.5 01/20/2017   HGB 12.0 01/20/2017   HCT 38.2  01/20/2017   MCV 90 01/20/2017   PLT 356 01/20/2017   Lab Results  Component Value Date   CREATININE 0.78 01/20/2017   BUN 8 01/20/2017   NA 142 01/20/2017   K 4.1 01/20/2017   CL 100 01/20/2017   CO2 25 01/20/2017    Lab Results  Component Value Date   ALT 19 12/29/2016   AST 17 12/29/2016   ALKPHOS 78 12/29/2016   BILITOT 0.4 12/29/2016   Lab Results  Component Value Date   HGBA1C 5.6 02/04/2017    Imaging Studies: No results found.

## 2017-06-16 NOTE — Progress Notes (Signed)
CC'ED TO PCP 

## 2017-06-16 NOTE — Assessment & Plan Note (Signed)
Stressed importance of weight loss. Will check LFTs twice yearly. Due anytime. She would like to wait until she sees her PCP and have all labs done at the same time.

## 2017-06-16 NOTE — Patient Instructions (Signed)
1. Stomach emptying xray as planned.  2. Please have your labs done in the next month or so.  3. Return to the office in six months.

## 2017-06-16 NOTE — Assessment & Plan Note (Signed)
Complains of early satiety, pp fullness, nausea. Likely gastroparesis in the setting of chronic pain medication. Patient just recently started metformin but HGBA1C is less than 6. Plan for GES baseline. Further recommendations to follow.

## 2017-06-21 ENCOUNTER — Other Ambulatory Visit: Payer: Self-pay | Admitting: Internal Medicine

## 2017-06-21 ENCOUNTER — Telehealth: Payer: Self-pay | Admitting: Internal Medicine

## 2017-06-21 MED FILL — LINZESS 290 MCG CAPSULE: 290 | 30 days supply | Qty: 30 | Fill #4

## 2017-06-21 MED FILL — traMADol HCL 50 MG TABS: 50 | 20 days supply | Qty: 40 | Fill #1

## 2017-06-21 MED FILL — LOSARTAN-HCTZ 100-25 MG TAB: 100-25 | 30 days supply | Qty: 30 | Fill #8

## 2017-06-21 NOTE — Telephone Encounter (Signed)
Message sent to referral specialist with this request.

## 2017-06-21 NOTE — Telephone Encounter (Signed)
Patient requesting referral to OB be switched to the office on [address] Would like a call if a referral is unable to be placed to this office  Thanks

## 2017-06-21 NOTE — Telephone Encounter (Signed)
Will forward to pcp

## 2017-06-21 NOTE — Telephone Encounter (Signed)
Pt. Called stating that she made a mistake on the referral for her OB. Pt. Would like referral sent to [address]. Please f/u

## 2017-06-23 ENCOUNTER — Ambulatory Visit (INDEPENDENT_AMBULATORY_CARE_PROVIDER_SITE_OTHER): Payer: Medicaid Other | Admitting: Pulmonary Disease

## 2017-06-23 ENCOUNTER — Encounter: Payer: Self-pay | Admitting: Pulmonary Disease

## 2017-06-23 VITALS — BP 136/78 | HR 105 | Ht 68.0 in | Wt 309.5 lb

## 2017-06-23 DIAGNOSIS — R042 Hemoptysis: Secondary | ICD-10-CM | POA: Diagnosis not present

## 2017-06-23 DIAGNOSIS — R0609 Other forms of dyspnea: Secondary | ICD-10-CM | POA: Diagnosis not present

## 2017-06-23 DIAGNOSIS — K219 Gastro-esophageal reflux disease without esophagitis: Secondary | ICD-10-CM | POA: Diagnosis not present

## 2017-06-23 DIAGNOSIS — J309 Allergic rhinitis, unspecified: Secondary | ICD-10-CM

## 2017-06-23 MED ORDER — MONTELUKAST SODIUM 10 MG PO TABS: 10.0000 mg | ORAL_TABLET | Freq: Every day | ORAL | 11 refills | Status: DC

## 2017-06-23 MED FILL — MONTELUKAST SOD 10 MG TAB: 10 | 30 days supply | Qty: 30 | Fill #0

## 2017-06-23 NOTE — Progress Notes (Signed)
Subjective:    Patient ID: Robin [name], female    DOB: 02-Mar-1956, 61 y.o.   MRN: [id number]  C.C.:  Follow-up for Dyspnea on Exertion, Hemoptysis, Chronic Allergic Rhinitis, & GERD.  HPI Hemoptysis: Patient underwent bronchoscopy with airway inspection prior to last appointment demonstrating superficial abrasions on her tonsils as well as a mucosal irregularity at the 12:00 position precarinal. This lesion had some increased submucosal vascularity but no signs of active bleeding. Previous culture positive for Haemophilus influenzae that was treated with azithromycin. Referred to ENT at last appointment with continued hemoptysis. She reports when she clears her throat she produces some bloody mucus. She has had CT imaging that was negative. She reports her cough varies in intensity.   Dyspnea on exertion: Variable. Likely due to obesity and physical deconditioning. Previous methacholine challenge test with normal bronchial activity. Patient previously also endorsing sensitivity to he and humidity with some mild intermittent wheezing. She reports her dyspnea on exertion is continuing to get worse.  Chronic allergic rhinitis: Patient previously was endorsing continued significant sinus congestion and drainage despite Singulair, Rhinocort/Flonase, Allegra/Claritin, & Atrovent nasal spray. She reports she has less post-nasal drainage. No significant sinus pressure or pain.   GERD: Follows with GI. Previously transitioned to Protonix and Zantac. She is planned for another test by her GI and reportedly her regimen is going to be adjusted. No odynophagia. She does note that she still has some dysphagia. She underwent an esophagram that showed a hiatal hernia and mild reflux.   Review of Systems She continues to gain weight. She has been referred to a weight loss physician. She reports her left hip and knee are causing her more pain. She also has pain in her hands and elbows. She reports rare chills. No  fever. She has had more pain form her left ear as well.   Allergies  Allergen Reactions  . Celexa [Citalopram Hydrobromide] Other (See Comments)    "Made me feel out of it"    Current Outpatient Prescriptions on File Prior to Visit  Medication Sig Dispense Refill  . acetaminophen (TYLENOL) 500 MG tablet Take 1 tablet (500 mg total) by mouth every 6 (six) hours as needed for headache. (Patient taking differently: Take 1,000 mg by mouth every 6 (six) hours as needed for headache. ) 30 tablet 0  . amitriptyline (ELAVIL) 50 MG tablet Take 1 tablet (50 mg total) by mouth at bedtime. 60 tablet 3  . amLODipine (NORVASC) 10 MG tablet Take 1 tablet (10 mg total) by mouth daily. 90 tablet 3  . aspirin 81 MG tablet Take 1 tablet (81 mg total) by mouth at bedtime. Reported on 12/09/2015 90 tablet 3  . buPROPion (WELLBUTRIN XL) 300 MG 24 hr tablet Take 1 tablet (300 mg total) by mouth every morning. Reported on 12/09/2015 90 tablet 3  . diclofenac sodium (VOLTAREN) 1 % GEL Apply 2 g topically 4 (four) times daily. 100 g 2  . fexofenadine (ALLEGRA) 180 MG tablet Take 1 tablet (180 mg total) by mouth daily. Reported on 12/09/2015 30 tablet 3  . fluticasone (FLONASE) 50 MCG/ACT nasal spray Place 2 sprays into both nostrils daily. 16 g 6  . gabapentin (NEURONTIN) 300 MG capsule Take 2 capsules (600 mg total) by mouth 3 (three) times daily. 180 capsule 5  . hydrALAZINE (APRESOLINE) 25 MG tablet Take 1 tablet (25 mg total) by mouth 3 (three) times daily. (Patient taking differently: Take 25 mg by mouth 2 (two) times daily. )  90 tablet 3  . ipratropium (ATROVENT) 0.06 % nasal spray Place 2 sprays into both nostrils 4 (four) times daily. (Patient taking differently: Place 1 spray into both nostrils 4 (four) times daily. ) 15 mL 1  . levothyroxine (SYNTHROID, LEVOTHROID) 88 MCG tablet Take 1 tablet (88 mcg total) by mouth daily. 90 tablet 2  . linaclotide (LINZESS) 290 MCG CAPS capsule Take 1 capsule (290 mcg total) by  mouth daily before breakfast. 30 capsule 5  . loratadine (CLARITIN) 10 MG tablet Take 1 tablet (10 mg total) by mouth daily. (Patient taking differently: Take 10 mg by mouth daily as needed for allergies. ) 30 tablet 11  . losartan-hydrochlorothiazide (HYZAAR) 100-25 MG tablet Take 1 tablet by mouth daily. Reported on 12/09/2015 90 tablet 2  . metFORMIN (GLUCOPHAGE) 500 MG tablet Take 0.5 tablets (250 mg total) by mouth daily with breakfast. 45 tablet 3  . methocarbamol (ROBAXIN) 500 MG tablet Take 1 tablet (500 mg total) by mouth every 8 (eight) hours as needed for muscle spasms. (Patient taking differently: Take 500 mg by mouth at bedtime as needed for muscle spasms. ) 60 tablet 1  . montelukast (SINGULAIR) 10 MG tablet Take 1 tablet (10 mg total) by mouth at bedtime. 30 tablet 3  . Multiple Vitamin (MULTIVITAMIN) tablet Take 1 tablet by mouth daily. Reported on 12/09/2015 90 tablet 1  . naproxen (NAPROSYN) 500 MG tablet Take 1 tablet (500 mg total) by mouth 2 (two) times daily with a meal. (Patient taking differently: Take 500 mg by mouth daily. ) 30 tablet 0  . pantoprazole (PROTONIX) 40 MG tablet Take 1 tablet (40 mg total) by mouth 2 (two) times daily before a meal. 60 tablet 5  . ranitidine (ZANTAC) 150 MG tablet TAKE 1 TABLET BY MOUTH AT BEDTIME. 30 tablet 5  . traMADol (ULTRAM) 50 MG tablet Take 1 tablet (50 mg total) by mouth 2 (two) times daily as needed. 40 tablet 1   No current facility-administered medications on file prior to visit.     Past Medical History:  Diagnosis Date  . Anxiety disorder   . Arthritis   . Chronic neck pain   . Constipation   . Diabetes mellitus without complication ([place])   . GERD (gastroesophageal reflux disease)   . Hiatal hernia    small  . Hyperlipidemia   . Hypertension   . Schatzki's ring    non critical  . Sleep apnea    CPAP, Sleep study at [facility]    Past Surgical History:  Procedure Laterality Date  . ABDOMINAL  HYSTERECTOMY    . ABDOMINAL HYSTERECTOMY  02/18/2000  . CARDIAC CATHETERIZATION  08/17/2001   normal L main/LAD/L Cfx/RCA (Dr. Adora Fridge)  . COLONOSCOPY  2006   Dr. [name], hyperplastic polyps  . COLONOSCOPY  08/06/11   abnormal terminal ileum for 10cm, erosions, geographical ulceration. Bx small bowel mucosa with prominent intramucosal lymphoid aggregates, slightly inflammed  . DIRECT LARYNGOSCOPY  03/15/2012   Procedure: DIRECT LARYNGOSCOPY;  Surgeon: [name], MD;  Location: [facility];  Service: ENT;  Laterality: N/A; Dr. Wolicki--:>no foreign body seen. normal esophagus to 40cm  . ESOPHAGEAL DILATION  04/17/2015   Procedure: ESOPHAGEAL DILATION;  Surgeon: [name], MD;  [facility];  Service: Endoscopy;;  . ESOPHAGOGASTRODUODENOSCOPY  08/23/2008   [facility]:NFAO distal esophageal erosions consistent with mild erosive reflux esophagitis, otherwise unremarkable esophagus/ Tiny antral erosions of doubtful clinical significance, otherwise normal stomach, patent pylorus,  normal D1 and D2  . ESOPHAGOGASTRODUODENOSCOPY  08/06/11   small hh, noncritical Schatzki's ring s/p 55 F  . ESOPHAGOGASTRODUODENOSCOPY N/A 04/17/2015   [facility]:SEGBTD s/p dilation  . ESOPHAGOSCOPY  03/15/2012   Procedure: ESOPHAGOSCOPY;  Surgeon: [name], MD;  Location: [facility];  Service: ENT;  Laterality: N/A;  . KNEE ARTHROSCOPY  04/27/2001  . NM MYOCAR PERF WALL MOTION  2003   negative bruce protocol exercise tress test; EF 68%; intermediate risk study due to evidence of anterior wall ischemia extending from mid-ventricle to apex  . 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 RECTOCELE REPAIR    . TRANSTHORACIC ECHOCARDIOGRAM  2003   EF normal  . VAGINAL PROLAPSE REPAIR    . VIDEO BRONCHOSCOPY Bilateral 03/08/2017   Procedure: VIDEO BRONCHOSCOPY WITHOUT FLUORO;  Surgeon: Javier Glazier, MD;  Location: [facility];  Service: Cardiopulmonary;  Laterality: Bilateral;    Family History   Problem Relation Age of Onset  . Kidney cancer Father   . Hypertension Father   . Hyperlipidemia Father   . Diabetes Mother   . Hypertension Mother   . Heart Problems Mother   . Hyperlipidemia Mother   . Asthma Mother   . Liver disease Brother   . Arthritis Brother   . Hypertension Sister   . Allergic rhinitis Sister   . Stomach cancer Unknown        aunt  . Breast cancer Maternal Grandmother   . Kidney disease Maternal Grandfather   . Breast cancer Paternal Grandmother   . Hypertension Brother   . Hyperlipidemia Brother   . Hypertension Brother   . Hypertension Sister   . Hyperlipidemia Sister   . Lupus Maternal Aunt   . Colon cancer Neg Hx   . Eczema Neg Hx   . Immunodeficiency Neg Hx   . Urticaria Neg Hx     Social History   Social History  . Marital status: Married    Spouse name: N/A  . Number of children: 4  . Years of education: 10   Occupational History  .  Unemployed   Social History Main Topics  . Smoking status: Former Smoker    Packs/day: 0.50    Years: 10.00    Types: Cigarettes    Start date: 07/14/1975    Quit date: 04/04/1993  . Smokeless tobacco: Never Used     Comment: 17 yrs ago  . Alcohol use No  . Drug use: No  . Sexual activity: Not Asked   Other Topics Concern  . None   Social History Narrative   Lives with husband in an apartment on the first floor.  Has 4 children.     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  Trying to get disability.  Formerly worked as a Teacher, early years/pre.  Education: 11th grade.      [facility] (12/23/16):   Originally from [place] [place]. She was raised in [place]. 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 No pets currently. No bird exposure. She does have mold in her current home in the bathroom, laundry room, & master bedroom.       Objective:   Physical  Exam BP 136/78 (BP Location: Right Arm, Cuff Size: Normal)   Pulse (!) 105   Ht '5\' 8"'$  (  1.727 m)   Wt (!) 309 lb 8 oz (140.4 kg)   SpO2 97%   BMI 47.06 kg/m   General:  Morbidly obese. No distress. Comfortable. Integument:  Warm. Dry. No rash on exposed skin. Extremities:  No cyanosis or clubbing.  Lymphatics: No appreciated cervical or supraclavicular lymphadenopathy. HEENT:  Moist mucus membranes. Minimal nasal turbinate swelling. No oral ulcers Cardiovascular:  Mildly tachycardic. No edema. Unable to appreciate JVD.  Pulmonary:  Overall good aeration & clear to auscultation bilaterally. Normal work of breathing on room air. Abdomen: Soft. Normal bowel sounds. Protuberant. Musculoskeletal:  Normal bulk and tone. No joint deformity or effusion appreciated.  PFT 02/09/17: FVC  2.35 L (77%) FEV1 2.06 L (85%) FEV1/FVC 0.88 FEF 25-75 3.32 L (142%) negative bronchodilator response TLC 3.74 L (68%) RV 71% ERV 28% DLCO corrected 71% 08/22/15: FVC 2.32 L (119%) FEV1 1.99 L (121%) FEV1/FVC 0.85 FEF 25-75 2.59 L (118%)  6MWT 02/16/17:  Walked 126 meters / Baseline Sat 99%  On RA / Nadir Sat 99% on RA  METHACHOLINE CHALLENGE TEST (02/24/17):  Normal bronchial hyperreactivity.  IMAGING BARIUM SWALLOW/ESOPHAGRAM 04/30/17 (per radiologist):  Hiatal hernia with mild reflux. Esophageal dysmotility  CT NECK/SOFT TISSUE W/ CONTRAST 04/19/17 (per radiologist): No inflammatory change or foreign body can be seen in the pharynx.  Cervical spondylosis. 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  CT CHEST W/O 01/07/17 (previously reviewed by me):  No parenchymal nodule, mass, or opacity appreciated. No pleural effusion or thickening. No pericardial effusion. No pathologic mediastinal adenopathy.  CTA CHEST 12/13/15 (previously reviewed by me): No pulmonary emboli. No pleural effusion or thickening. No pericardial effusion. No pathologic mediastinal adenopathy. No  parenchymal nodule, mass, or opacification.  CARDIAC TTE (09/29/16): LV normal in size with moderate concentric hypertrophy. EF 55-60% with no regional wall motion abnormalities. Indeterminant diastolic function. LA & RA normal in size. RV normal in size and function. No aortic stenosis or regurgitation. Aortic root normal in size. No mitral stenosis or regurgitation. No significant pulmonic regurgitation. No significant tricuspid regurgitation. No pericardial effusion.  LEFT HEART CATHETERIZATION (11/13/3):  Aortic systolic pressure 144  Diastolic pressure 90  Left ventricular systolic pressure 315  End-diastolic pressure 27 left main coronary artery normal  Left anterior descending artery normal  Left circumflex coronary artery normal  Right coronary artery dominant and normal   Overall estimated ejection fraction greater than 60% without focal wall motion abnormality  MICROBIOLOGY Endobronchial/Endotracheal Brushing 03/08/17: Rare Haemophilus influenza beta lactamase positive  PATHOLOGY Endobronchial/Endotracheal Brushing 03/08/17: No malignancy. Negative for herpes & HPV.  LABS 05/28/17 BNP:  6.1 ESR:  34 TSH:  1.060 Anti-CCP:  6 RF:  <10  02/16/17 IgE: 81 RAST panel: Cockroach 0.2 / D. farinae 0.2 / D. pteronyssinus 0.24   12/23/16 INR: 1.0 PTT: 24.1 Chromatin Ab:  <0.2 Smith Ab: <0.2 DS DNA Ab:  <1 SSA:  <0.2 SSB:  <0.2 Anti-CCP:  <16 SCL-70:  <0.2 ENA RNP Ab: Centromere Ab Screen:  <0.2 Jo-1 Ab:  <0.2  05/12/16 ANA: Negative Rheumatoid factor:  <10    Assessment & Plan:  61 y.o. female with continued dyspnea on exertion. This is likely due to her morbid obesity given her continued and ongoing weight gain. Patient's intermittent blood-tinged mucus is likely due to erosions on her tonsils. She has no other new breathing problems. I reviewed her previous tests noted above including following her CT scan of the neck and soft tissue but also her esophagram  and  serum blood work. Her reflux seems to be reasonably well-controlled at this time. Her allergic rhinitis has improved with the addition of Singulair to her regimen. I instructed the patient to notify my office if she developed any new breathing problems or had any questions as we would be happy to see her back.   1. Hemoptysis: Intermittent. Likely secondary to erosions from tonsils. No further testing at this time. 2. Dyspnea on exertion: Likely secondary to obesity and physical deconditioning. Lengthy discussion regarding dietary modification. Referral to dietitian placed. 3. Chronic allergic rhinitis: Patient provided with prescription refills of Singulair. Continuing current regimen. 4. GERD: Managed by GI. Patient continuing Protonix and Zantac. 5. Health maintenance: Status post Tdap November 2017 & Pneumovax November 2017. 6. Follow-up: Return to clinic as needed.  Sonia Baller [name], M.D. [facility] Pager:  [phone] After 3pm or if no response, call [phone] 12:03 PM 06/23/17

## 2017-06-23 NOTE — Patient Instructions (Signed)
   Remember to keep a food journal documenting what you are eating and drinking with each meal until you see the dietician.  We will leave your next appointment open-ended. Please call us if you have any new breathing problems and we will be happy to see you back.

## 2017-06-24 ENCOUNTER — Encounter: Payer: Medicaid Other | Admitting: Family Medicine

## 2017-06-24 MED FILL — raNITIdine HCL 150 MG TABS: 150 | 30 days supply | Qty: 30 | Fill #4

## 2017-06-24 MED FILL — BENZONATATE 100 MG CAPSULE: 100 | 20 days supply | Qty: 60 | Fill #3

## 2017-06-29 ENCOUNTER — Other Ambulatory Visit: Payer: Self-pay

## 2017-06-29 MED ORDER — HYDRALAZINE HCL 25 MG PO TABS: 25.0000 mg | ORAL_TABLET | Freq: Three times a day (TID) | ORAL | 3 refills | Status: DC

## 2017-06-29 MED FILL — AMITRIPTYLINE HCL 50 MG TAB: 50 | 30 days supply | Qty: 30 | Fill #6

## 2017-06-29 MED FILL — AMLODIPINE BESYLATE 10 MG T: 10 | 30 days supply | Qty: 30 | Fill #9

## 2017-07-02 ENCOUNTER — Ambulatory Visit (HOSPITAL_COMMUNITY)
Admission: RE | Admit: 2017-07-02 | Discharge: 2017-07-02 | Disposition: A | Discharge status: Home / Self Care | Payer: Medicaid Other | Source: Ambulatory Visit | Attending: Internal Medicine | Admitting: Internal Medicine

## 2017-07-02 ENCOUNTER — Ambulatory Visit (HOSPITAL_COMMUNITY)
Admission: RE | Admit: 2017-07-02 | Discharge: 2017-07-02 | Disposition: A | Discharge status: Home / Self Care | Payer: Medicaid Other | Source: Ambulatory Visit | Attending: Gastroenterology | Admitting: Gastroenterology

## 2017-07-02 DIAGNOSIS — M79642 Pain in left hand: Secondary | ICD-10-CM | POA: Diagnosis not present

## 2017-07-02 DIAGNOSIS — M79641 Pain in right hand: Secondary | ICD-10-CM | POA: Diagnosis present

## 2017-07-02 DIAGNOSIS — M19042 Primary osteoarthritis, left hand: Secondary | ICD-10-CM | POA: Diagnosis not present

## 2017-07-02 DIAGNOSIS — R6881 Early satiety: Secondary | ICD-10-CM | POA: Insufficient documentation

## 2017-07-02 DIAGNOSIS — M19041 Primary osteoarthritis, right hand: Secondary | ICD-10-CM | POA: Diagnosis not present

## 2017-07-02 MED ORDER — TECHNETIUM TC 99M SULFUR COLLOID
2.0000 | Freq: Once | INTRAVENOUS | Status: AC | PRN
  Administered 2017-07-02: 2 via ORAL

## 2017-07-05 ENCOUNTER — Encounter: Payer: Self-pay | Admitting: Registered"

## 2017-07-05 ENCOUNTER — Encounter: Payer: Medicaid Other | Attending: Internal Medicine | Admitting: Registered"

## 2017-07-05 DIAGNOSIS — K219 Gastro-esophageal reflux disease without esophagitis: Secondary | ICD-10-CM | POA: Insufficient documentation

## 2017-07-05 DIAGNOSIS — R0609 Other forms of dyspnea: Secondary | ICD-10-CM | POA: Insufficient documentation

## 2017-07-05 DIAGNOSIS — Z713 Dietary counseling and surveillance: Secondary | ICD-10-CM | POA: Insufficient documentation

## 2017-07-05 MED FILL — GABAPENTIN 300 MG CAPSULE: 300 | 30 days supply | Qty: 180 | Fill #2

## 2017-07-05 MED FILL — hydrALAZINE HCL 25 MG TABS: 25 | 30 days supply | Qty: 90 | Fill #0

## 2017-07-05 MED FILL — metFORMIN HCL 500 MG TABS: 500 | 30 days supply | Qty: 15 | Fill #6

## 2017-07-05 NOTE — Patient Instructions (Addendum)
Make sure to get in 3 meals per day to ensure your body is getting in regular nutrition and to promote healthy metabolism. Try to have balanced meals like the plate example (see handout). Having more, smaller meals can also help reduce GERD as well.  Try to promote mindfulness by trying to put away electronics at meal times.   Recommend keeping a food/symptom journal to assess foods that are worsening GERD. Recommend limiting/avoiding foods/beverages that worsen reflux (see handout).    Recommend taking fish oil supplement to help with arthritis-recommended brand Petra Kuba Made 1000 mg omega 3. Food sources include fatty fish such as salmon and tuna, walnuts, flaxseed, canola oil, and foods fortified with omega 3.

## 2017-07-05 NOTE — Progress Notes (Signed)
Medical Nutrition Therapy:  Appt start time: 4098 end time:  1191.   Assessment:  Primary concerns today: Pt referred due to GERD. Pt says she really wants to lose weight. Pt says she has severe arthritis which is especially difficult in her hips and knees. Pt says she wants to have knee and hip surgery, but her orthopedic physician told her she will need to lose weight before having surgery. Pt says her reflux has been worse over the past month or two. She says she thinks dairy worsens her reflux because it seems worse after she eats yogurt in the morning, however, pt says she also has coffee often at the same time. Pt also reports having worse GERD symptoms after eating ice cream. Pt says she has been trying to include more fruits and vegetables and cut down on salt and sweets and that she has not had ice cream in the past two weeks.   Preferred Learning Style:  No preference indicated   Learning Readiness:  Ready  MEDICATIONS: See list.    DIETARY INTAKE:  Usual eating pattern includes 2 meals and 1-2 snacks per day. Meals at home are usually eaten in the livingroom with her husband while watching TV. Pt says she feels she is a slow eater. Pt says she really likes ice cream, but she has been trying to not eat it and has not had it in the past two weeks.   Everyday foods vary.  Avoided foods include none reported.    24-hr recall:  B ( AM): grits, 2 eggs, 2 pieces of [place] bacon, 2 pieces of sausage, hash browns, canned pear, a piece of [place]  Snk ( AM): None reported.  L ( PM): None reported.  Snk ( PM): None reported.  D (8 PM): stew with beef, carrots, potatoes, brown rice, and some corn bread   Snk ( PM): None reported.  Beverages: coffee, water, orange juice, Ginger Ale  Usual physical activity: Pt says she tries to walk, but it is difficult due to the arthritis. Pt says she tries to walk around some each day by going to and walking around stores.   Progress Towards Goal(s):   In progress.   Nutritional Diagnosis:  NI-5.11.1 Predicted suboptimal nutrient intake As related to skipping meals .  As evidenced by pt's reported dietary recall and habits .    Intervention:  Nutrition counseling provided. Dietitian provided education on balanced nutrition and the importance of getting in regular nutrition/not skipping meals. Dietitian discussed with pt how skipping meals slows metabolism and can lead to weight gain as well as becoming overly hungry. Provided nutrition education for GERD. Discussed how getting in more, smaller meals is recommended to help with preventing reflux. Recommended pt work toward getting in three meals per day and then could move toward having 4-6 small meals per day. Encouraged pt to keep food/symptom journal to help assess foods that exacerbate her reflux. Provided education on mindful eating. Encouraged pt to turn off the TV at mealtimes. Recommended pt take fish oil supplement to help with arthritis and promote heart health. Encouraged pt to continue including some physical activity and provided handout for chair exercises. Pt appeared agreeable to information/goals discussed.   Goals:   Make sure to get in 3 meals per day to ensure your body is getting in regular nutrition and to promote healthy metabolism. Try to have balanced meals like the plate example (see handout). Having more, smaller meals can also help reduce reflux as  well.  Try to promote mindfulness by trying to put away electronics at meal times.   Recommend keeping a food/symptom journal to assess foods that are worsening GERD. Recommend limiting/avoiding foods/beverages that worsen reflux (see handout).     Recommend taking fish oil supplement to help with arthritis-recommended brand Petra Kuba Made 1000 mg omega 3. Food sources include fatty fish such as salmon and tuna, walnuts, flaxseed, canola oil, and foods fortified with omega 3.   Teaching Method  Utilized: Visual Auditory  Handouts given during visit include:  Balanced plate with food list  GERD Nutrition Therapy   Chair Exercises   Barriers to learning/adherence to lifestyle change: None indicated.  Demonstrated degree of understanding via:  Teach Back   Monitoring/Evaluation:  Dietary intake, exercise, and body weight in 1 month(s).

## 2017-07-08 ENCOUNTER — Telehealth: Payer: Self-pay

## 2017-07-08 NOTE — Telephone Encounter (Signed)
Contacted pt to go over xray results pt is aware and doesn't have any questions or concerns  

## 2017-07-12 ENCOUNTER — Other Ambulatory Visit: Payer: Self-pay | Admitting: Pharmacist

## 2017-07-12 MED ORDER — LEVOTHYROXINE SODIUM 88 MCG PO TABS: 88.0000 ug | ORAL_TABLET | Freq: Every day | ORAL | 0 refills | Status: DC

## 2017-07-12 MED FILL — LEVOTHYROXINE 88 MCG TABLET: 88 | 30 days supply | Qty: 30 | Fill #0

## 2017-07-12 MED FILL — buPROPion HCL ER (XL) 300 M: 300 | 30 days supply | Qty: 30 | Fill #3

## 2017-07-12 NOTE — Progress Notes (Signed)
Please let patient know her GES was essentially normal. Tiny bit delayed emptying (she emptied 4% at 1 hour and normal is greater than 10% at 1 hour) at 1 hour but normal by 2 hours.  My suggestions are to consider change in PPI to see if that helps. Eat small meals more regularly given that slight delay in emptying at 1 hour point. LIMIT NSAIDS as much as possible given her prior h/o erosions in colon from NSAIDS. Weight loss will help as well.   If she wants to try different PPI let me know.

## 2017-07-15 ENCOUNTER — Other Ambulatory Visit: Payer: Self-pay | Admitting: Internal Medicine

## 2017-07-21 ENCOUNTER — Telehealth: Payer: Self-pay | Admitting: Internal Medicine

## 2017-07-21 NOTE — Telephone Encounter (Signed)
Patient came into office requesting medication refill on traMADol (ULTRAM) 50 MG tablet  . Please f/up

## 2017-07-21 NOTE — Telephone Encounter (Signed)
[name], see GES, pt wants to change PPI

## 2017-07-21 NOTE — Telephone Encounter (Signed)
Pt called to say that LSL was going to change her medication and has it been called into her pharmacy yet. She doesn't know the name of the medicine. Please advise and call her back at [phone]

## 2017-07-22 ENCOUNTER — Other Ambulatory Visit: Payer: Self-pay | Admitting: Gastroenterology

## 2017-07-22 MED ORDER — OMEPRAZOLE 20 MG PO TBEC: 20.0000 mg | DELAYED_RELEASE_TABLET | Freq: Two times a day (BID) | ORAL | 5 refills | Status: DC

## 2017-07-22 MED FILL — ?OMEPRAZOLE DR 20 MG CAPSUL: 20 | 30 days supply | Qty: 60 | Fill #0

## 2017-07-22 NOTE — Progress Notes (Signed)
RX for omeprazole 20mg  bid sent to pharmacy. Please let patient know I accidentally sent it to [facility] first but [facility]

## 2017-07-22 NOTE — Telephone Encounter (Signed)
See result note.  

## 2017-07-22 NOTE — Telephone Encounter (Signed)
LMOM, pt notified that rx is at pharmacy

## 2017-07-23 DIAGNOSIS — R32 Unspecified urinary incontinence: Secondary | ICD-10-CM | POA: Insufficient documentation

## 2017-07-23 NOTE — Telephone Encounter (Signed)
She has a pain contract with her PCP and has an upcoming appointment for 10/26 at which time she will receive her refill.

## 2017-07-26 MED FILL — LOSARTAN-HCTZ 100-25 MG TAB: 100-25 | 30 days supply | Qty: 30 | Fill #1

## 2017-07-30 ENCOUNTER — Encounter: Payer: Self-pay | Admitting: Internal Medicine

## 2017-07-30 ENCOUNTER — Ambulatory Visit: Payer: Medicaid Other | Attending: Internal Medicine | Admitting: Internal Medicine

## 2017-07-30 VITALS — BP 134/77 | HR 78 | Temp 98.3°F | Resp 16 | Wt 309.4 lb

## 2017-07-30 DIAGNOSIS — Z833 Family history of diabetes mellitus: Secondary | ICD-10-CM | POA: Diagnosis not present

## 2017-07-30 DIAGNOSIS — Z79899 Other long term (current) drug therapy: Secondary | ICD-10-CM | POA: Diagnosis not present

## 2017-07-30 DIAGNOSIS — Z7984 Long term (current) use of oral hypoglycemic drugs: Secondary | ICD-10-CM | POA: Diagnosis not present

## 2017-07-30 DIAGNOSIS — E039 Hypothyroidism, unspecified: Secondary | ICD-10-CM | POA: Insufficient documentation

## 2017-07-30 DIAGNOSIS — Z7982 Long term (current) use of aspirin: Secondary | ICD-10-CM | POA: Diagnosis not present

## 2017-07-30 DIAGNOSIS — Z8379 Family history of other diseases of the digestive system: Secondary | ICD-10-CM | POA: Diagnosis not present

## 2017-07-30 DIAGNOSIS — R7303 Prediabetes: Secondary | ICD-10-CM | POA: Diagnosis not present

## 2017-07-30 DIAGNOSIS — Z23 Encounter for immunization: Secondary | ICD-10-CM | POA: Diagnosis not present

## 2017-07-30 DIAGNOSIS — M797 Fibromyalgia: Secondary | ICD-10-CM | POA: Diagnosis not present

## 2017-07-30 DIAGNOSIS — I10 Essential (primary) hypertension: Secondary | ICD-10-CM | POA: Insufficient documentation

## 2017-07-30 DIAGNOSIS — F329 Major depressive disorder, single episode, unspecified: Secondary | ICD-10-CM | POA: Insufficient documentation

## 2017-07-30 DIAGNOSIS — M159 Polyosteoarthritis, unspecified: Secondary | ICD-10-CM | POA: Insufficient documentation

## 2017-07-30 DIAGNOSIS — Z87891 Personal history of nicotine dependence: Secondary | ICD-10-CM | POA: Insufficient documentation

## 2017-07-30 DIAGNOSIS — E739 Lactose intolerance, unspecified: Secondary | ICD-10-CM | POA: Insufficient documentation

## 2017-07-30 DIAGNOSIS — Z888 Allergy status to other drugs, medicaments and biological substances status: Secondary | ICD-10-CM | POA: Insufficient documentation

## 2017-07-30 DIAGNOSIS — K219 Gastro-esophageal reflux disease without esophagitis: Secondary | ICD-10-CM | POA: Diagnosis not present

## 2017-07-30 DIAGNOSIS — Z825 Family history of asthma and other chronic lower respiratory diseases: Secondary | ICD-10-CM | POA: Insufficient documentation

## 2017-07-30 DIAGNOSIS — Z8249 Family history of ischemic heart disease and other diseases of the circulatory system: Secondary | ICD-10-CM | POA: Diagnosis not present

## 2017-07-30 DIAGNOSIS — G4733 Obstructive sleep apnea (adult) (pediatric): Secondary | ICD-10-CM | POA: Diagnosis not present

## 2017-07-30 DIAGNOSIS — Z8261 Family history of arthritis: Secondary | ICD-10-CM | POA: Insufficient documentation

## 2017-07-30 DIAGNOSIS — Z6841 Body Mass Index (BMI) 40.0 and over, adult: Secondary | ICD-10-CM

## 2017-07-30 LAB — GLUCOSE, POCT (MANUAL RESULT ENTRY): POC GLUCOSE: 88 mg/dL (ref 70–99)

## 2017-07-30 MED ORDER — TRAMADOL HCL 50 MG PO TABS: 50.0000 mg | ORAL_TABLET | Freq: Two times a day (BID) | ORAL | 2 refills | Status: DC | PRN

## 2017-07-30 MED ORDER — BLOOD GLUCOSE TEST VI STRP: ORAL_STRIP | 1 refills | Status: DC

## 2017-07-30 MED FILL — traMADol HCL 50 MG TABS: 50 | 30 days supply | Qty: 60 | Fill #0

## 2017-07-30 MED FILL — ACCU-CHEK AVIVA PLUS TEST S: 30 days supply | Qty: 100 | Fill #0

## 2017-07-30 NOTE — Progress Notes (Signed)
Patient ID: Robin [name], female    DOB: [birth year]  MRN: [id number]  CC: Follow-up   Subjective: Robin [name] is a 61 y.o. female who presents for chronic ds management Her concerns today include:  Patient with history of hypertension, pre-diabetes, obstructive sleep apnea, fibromyalgia, depression, obesity, GERD, hypothyroid  Since last visit patient was seen by [facility] on 9/12 for fatty liver and early satiety. Gastric emptying study was done which was a little abnormal. Patient was advised to eat small meals, limit NSAIDs and weight loss encouraged. Placed on Omeprazole.  Patient also seen in follow-up by [facility]. Dyspnea on exertion thought to be due to deconditioning and obesity. Patient referred to dietitian. Seen already. Found it helpful; working on advice given. Has f/u next mth. Thinking about wgh loss surgery. Got free samples of ReBoot Cleanse and Garcinia Cambrosa  OSA: did not get hose for CPAP machine. Spoke with DME company. Will need a new machine; had current machine for 5 yr. She has to speak with Medicaid. -spouse says she snores a lot. + day time sleepiness  GYN: seen and had pap. Told normal  Joint pains: X-rays of hands c/w OA. Tramadol and Gabapentin help. Pain also in both knees, shoulders, hip and back.  Previous x-rays show findings consistent with OA and all of these mentioned joints. She would like referral to pain specialist for rheumatology  Prediabetes: Would like stripes to check BS. Has One Touch and Accu-check Machine  Patient Active Problem List   Diagnosis Date Noted  . Immunization due 07/30/2017  . Early satiety 06/16/2017  . Nausea without vomiting 06/16/2017  . Hx of iron deficiency anemia 05/28/2017  . Prediabetes 02/04/2017  . Chronic allergic rhinitis 12/23/2016  . Hemoptysis 12/23/2016  . Fibromyalgia 08/18/2016  . Chronic lymphocytic thyroiditis 08/18/2016  . Depression 04/01/2016  . OSA (obstructive sleep apnea)  04/01/2016  . HTN (hypertension), benign 12/09/2015  . OA (osteoarthritis) of knee 12/09/2015  . Hypothyroidism 12/09/2015  . Morbid obesity ([place]) 12/09/2015  . Constipation 05/27/2015  . Fatty liver 05/27/2015  . Abnormal small bowel biopsy 09/18/2011  . Lactose intolerance 09/18/2011  . Dysuria 06/25/2011  . GERD 01/21/2010     Current Outpatient Prescriptions on File Prior to Visit  Medication Sig Dispense Refill  . acetaminophen (TYLENOL) 500 MG tablet Take 1 tablet (500 mg total) by mouth every 6 (six) hours as needed for headache. (Patient taking differently: Take 1,000 mg by mouth every 6 (six) hours as needed for headache. ) 30 tablet 0  . amitriptyline (ELAVIL) 50 MG tablet Take 1 tablet (50 mg total) by mouth at bedtime. 60 tablet 3  . amLODipine (NORVASC) 10 MG tablet Take 1 tablet (10 mg total) by mouth daily. 90 tablet 3  . aspirin 81 MG tablet Take 1 tablet (81 mg total) by mouth at bedtime. Reported on 12/09/2015 90 tablet 3  . buPROPion (WELLBUTRIN XL) 300 MG 24 hr tablet Take 1 tablet (300 mg total) by mouth every morning. Reported on 12/09/2015 90 tablet 3  . diclofenac sodium (VOLTAREN) 1 % GEL Apply 2 g topically 4 (four) times daily. 100 g 2  . fexofenadine (ALLEGRA) 180 MG tablet Take 1 tablet (180 mg total) by mouth daily. Reported on 12/09/2015 30 tablet 3  . fluticasone (FLONASE) 50 MCG/ACT nasal spray Place 2 sprays into both nostrils daily. 16 g 6  . gabapentin (NEURONTIN) 300 MG capsule Take 2 capsules (600 mg total) by mouth 3 (three)  times daily. 180 capsule 5  . hydrALAZINE (APRESOLINE) 25 MG tablet Take 1 tablet (25 mg total) by mouth 3 (three) times daily. 90 tablet 3  . ipratropium (ATROVENT) 0.06 % nasal spray Place 2 sprays into both nostrils 4 (four) times daily. (Patient taking differently: Place 1 spray into both nostrils 4 (four) times daily. ) 15 mL 1  . levothyroxine (SYNTHROID, LEVOTHROID) 88 MCG tablet Take 1 tablet (88 mcg total) by mouth daily. 90  tablet 0  . linaclotide (LINZESS) 290 MCG CAPS capsule Take 1 capsule (290 mcg total) by mouth daily before breakfast. 30 capsule 5  . loratadine (CLARITIN) 10 MG tablet Take 1 tablet (10 mg total) by mouth daily. (Patient taking differently: Take 10 mg by mouth daily as needed for allergies. ) 30 tablet 11  . losartan-hydrochlorothiazide (HYZAAR) 100-25 MG tablet Take 1 tablet by mouth daily. Reported on 12/09/2015 90 tablet 2  . metFORMIN (GLUCOPHAGE) 500 MG tablet Take 0.5 tablets (250 mg total) by mouth daily with breakfast. 45 tablet 3  . methocarbamol (ROBAXIN) 500 MG tablet Take 1 tablet (500 mg total) by mouth every 8 (eight) hours as needed for muscle spasms. (Patient taking differently: Take 500 mg by mouth at bedtime as needed for muscle spasms. ) 60 tablet 1  . montelukast (SINGULAIR) 10 MG tablet Take 1 tablet (10 mg total) by mouth at bedtime. 30 tablet 11  . Multiple Vitamin (MULTIVITAMIN) tablet Take 1 tablet by mouth daily. Reported on 12/09/2015 90 tablet 1  . Omeprazole 20 MG TBEC Take 1 tablet (20 mg total) by mouth 2 (two) times daily before a meal. 60 tablet 5   No current facility-administered medications on file prior to visit.     Allergies  Allergen Reactions  . Celexa [Citalopram Hydrobromide] Other (See Comments)    "Made me feel out of it"    Social History   Social History  . Marital status: Married    Spouse name: N/A  . Number of children: 4  . Years of education: 10   Occupational History  .  Unemployed   Social History Main Topics  . Smoking status: Former Smoker    Packs/day: 0.50    Years: 10.00    Types: Cigarettes    Start date: 07/14/1975    Quit date: 04/04/1993  . Smokeless tobacco: Never Used     Comment: 17 yrs ago  . Alcohol use No  . Drug use: No  . Sexual activity: Not on file   Other Topics Concern  . Not on file   Social History Narrative   Lives with husband in an apartment on the first floor.  Has 4 children.     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  Trying to get disability.  Formerly worked as a Teacher, early years/pre.  Education: 11th grade.      [facility] (12/23/16):   Originally from [facility] [place]. She was raised in [place]. 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 No pets currently. No bird exposure. She does have mold in her current home in the bathroom, laundry room, & master bedroom.     Family History  Problem Relation Age of Onset  . Kidney cancer Father   . Hypertension Father   . Hyperlipidemia Father   . Sleep apnea  Father   . Diabetes Mother   . Hypertension Mother   . Heart Problems Mother   . Hyperlipidemia Mother   . Asthma Mother   . Sleep apnea Mother   . Liver disease Brother   . Arthritis Brother   . Hypertension Sister   . Allergic rhinitis Sister   . Stomach cancer Unknown        aunt  . Breast cancer Maternal Grandmother   . Kidney disease Maternal Grandfather   . Breast cancer Paternal Grandmother   . Hypertension Brother   . Hyperlipidemia Brother   . Hypertension Brother   . Hypertension Sister   . Hyperlipidemia Sister   . Lupus Maternal Aunt   . Colon cancer Neg Hx   . Eczema Neg Hx   . Immunodeficiency Neg Hx   . Urticaria Neg Hx     Past Surgical History:  Procedure Laterality Date  . ABDOMINAL HYSTERECTOMY    . ABDOMINAL HYSTERECTOMY  02/18/2000  . CARDIAC CATHETERIZATION  08/17/2001   normal L main/LAD/L Cfx/RCA (Dr. Adora Fridge)  . COLONOSCOPY  2006   Dr. [name], hyperplastic polyps  . COLONOSCOPY  08/06/11   abnormal terminal ileum for 10cm, erosions, geographical ulceration. Bx small bowel mucosa with prominent intramucosal lymphoid aggregates, slightly inflammed  . DIRECT LARYNGOSCOPY  03/15/2012   Procedure: DIRECT LARYNGOSCOPY;  Surgeon: [name], MD;  Location: [facility];  Service: ENT;   Laterality: N/A; Dr. Wolicki--:>no foreign body seen. normal esophagus to 40cm  . ESOPHAGEAL DILATION  04/17/2015   Procedure: ESOPHAGEAL DILATION;  Surgeon: [name], MD;  [facility];  Service: Endoscopy;;  . ESOPHAGOGASTRODUODENOSCOPY  08/23/2008   NLG:XQJJ distal esophageal erosions consistent with mild erosive reflux esophagitis, otherwise unremarkable esophagus/ Tiny antral erosions of doubtful clinical significance, otherwise normal stomach, patent pylorus, normal D1 and D2  . ESOPHAGOGASTRODUODENOSCOPY  08/06/11   small hh, noncritical Schatzki's ring s/p 87 F  . ESOPHAGOGASTRODUODENOSCOPY N/A 04/17/2015   HER:DEYCXK s/p dilation  . ESOPHAGOSCOPY  03/15/2012   Procedure: ESOPHAGOSCOPY;  Surgeon: [name], MD;  Location: [place];  Service: ENT;  Laterality: N/A;  . KNEE ARTHROSCOPY  04/27/2001  . NM MYOCAR PERF WALL MOTION  2003   negative bruce protocol exercise tress test; EF 68%; intermediate risk study due to evidence of anterior wall ischemia extending from mid-ventricle to apex  . 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 RECTOCELE REPAIR    . TRANSTHORACIC ECHOCARDIOGRAM  2003   EF normal  . VAGINAL PROLAPSE REPAIR    . VIDEO BRONCHOSCOPY Bilateral 03/08/2017   Procedure: VIDEO BRONCHOSCOPY WITHOUT FLUORO;  Surgeon: Javier Glazier, MD;  Location: [place];  Service: Cardiopulmonary;  Laterality: Bilateral;    ROS: Review of Systems Negative except as stated above PHYSICAL EXAM: BP 134/77   Pulse 78   Temp 98.3 F (36.8 C) (Oral)   Resp 16   Wt (!) 309 lb 6.4 oz (140.3 kg)   SpO2 99%   BMI 47.04 kg/m   Physical Exam General appearance - alert, well appearing, and in no distress Mental status - alert, oriented to person, place, and time, normal mood, behavior, speech, dress, motor activity, and thought processes Neck - supple, no significant adenopathy Chest - clear to auscultation, no wheezes, rales or rhonchi,  symmetric air entry Heart - normal rate, regular rhythm, normal S1, S2, no murmurs, rubs, clicks or gallops Extremities - peripheral pulses normal, no pedal edema,  no clubbing or cyanosis MSK: cane  Results for orders placed or performed in visit on 07/30/17  POCT glucose (manual entry)  Result Value Ref Range   POC Glucose 88 70 - 99 mg/dl    ASSESSMENT AND PLAN: 1. Generalized OA -Stressed the importance of weight loss through change in eating habits and regular exercise as tolerated Refill given on tramadol. Controlled substance prescribing agreement updated 05/28/2017. - Ambulatory referral to Rheumatology - traMADol (ULTRAM) 50 MG tablet; Take 1 tablet (50 mg total) by mouth 2 (two) times daily as needed.  Dispense: 60 tablet; Refill: 2  2. Class 3 severe obesity due to excess calories with serious comorbidity and body mass index (BMI) of 45.0 to 49.9 in adult [facility]) -See #1 above She is seeing a nutritionist and has a follow-up appointment  3. Essential hypertension -at goal. Continue Cozaar/HCTZ, amlodipine, hydralazine  4. Prediabetes - POCT glucose (manual entry) - Glucose Blood (BLOOD GLUCOSE TEST STRIPS) STRP; Use as directed. One Touch.  Dispense: 100 each; Refill: 1  5. OSA (obstructive sleep apnea) Patient will let me know if she needs prescription for new CPAP machine.  However she may be required to have a new sleep study. she will let me know.  6. Need for influenza vaccination - Flu Vaccine QUAD 6+ mos PF IM (Fluarix Quad PF)  Patient was given the opportunity to ask questions.  Patient verbalized understanding of the plan and was able to repeat key elements of the plan.   Orders Placed This Encounter  Procedures  . Flu Vaccine QUAD 6+ mos PF IM (Fluarix Quad PF)  . Ambulatory referral to Rheumatology  . POCT glucose (manual entry)     Requested Prescriptions   Signed Prescriptions Disp Refills  . traMADol (ULTRAM) 50 MG tablet 60 tablet 2    Sig:  Take 1 tablet (50 mg total) by mouth 2 (two) times daily as needed.  . Glucose Blood (BLOOD GLUCOSE TEST STRIPS) STRP 100 each 1    Sig: Use as directed. One Touch.    Return in about 4 months (around 11/30/2017).  Karle Plumber, MD, FACP

## 2017-07-30 NOTE — Patient Instructions (Addendum)
Please focus on weight loss through healthy eating habits and trying to get in some exercise.  Let me know whether he will need a new sleep study. CPAP machine.  I have referred her to the rheumatologist. Per request. Influenza Virus Vaccine injection (Fluarix) What is this medicine? INFLUENZA VIRUS VACCINE (in floo EN zuh VAHY ruhs vak SEEN) helps to reduce the risk of getting influenza also known as the flu. This medicine may be used for other purposes; ask your health care provider or pharmacist if you have questions. COMMON BRAND NAME(S): Fluarix, Fluzone What should I tell my health care provider before I take this medicine? They need to know if you have any of these conditions: -bleeding disorder like hemophilia -fever or infection -Guillain-Barre syndrome or other neurological problems -immune system problems -infection with the human immunodeficiency virus (HIV) or AIDS -low blood platelet counts -multiple sclerosis -an unusual or allergic reaction to influenza virus vaccine, eggs, chicken proteins, latex, gentamicin, other medicines, foods, dyes or preservatives -pregnant or trying to get pregnant -breast-feeding How should I use this medicine? This vaccine is for injection into a muscle. It is given by a health care professional. A copy of Vaccine Information Statements will be given before each vaccination. Read this sheet carefully each time. The sheet may change frequently. Talk to your pediatrician regarding the use of this medicine in children. Special care may be needed. Overdosage: If you think you have taken too much of this medicine contact a poison control center or emergency room at once. NOTE: This medicine is only for you. Do not share this medicine with others. What if I miss a dose? This does not apply. What may interact with this medicine? -chemotherapy or radiation therapy -medicines that lower your immune system like etanercept, anakinra, infliximab, and  adalimumab -medicines that treat or prevent blood clots like warfarin -phenytoin -steroid medicines like prednisone or cortisone -theophylline -vaccines This list may not describe all possible interactions. Give your health care provider a list of all the medicines, herbs, non-prescription drugs, or dietary supplements you use. Also tell them if you smoke, drink alcohol, or use illegal drugs. Some items may interact with your medicine. What should I watch for while using this medicine? Report any side effects that do not go away within 3 days to your doctor or health care professional. Call your health care provider if any unusual symptoms occur within 6 weeks of receiving this vaccine. You may still catch the flu, but the illness is not usually as bad. You cannot get the flu from the vaccine. The vaccine will not protect against colds or other illnesses that may cause fever. The vaccine is needed every year. What side effects may I notice from receiving this medicine? Side effects that you should report to your doctor or health care professional as soon as possible: -allergic reactions like skin rash, itching or hives, swelling of the face, lips, or tongue Side effects that usually do not require medical attention (report to your doctor or health care professional if they continue or are bothersome): -fever -headache -muscle aches and pains -pain, tenderness, redness, or swelling at site where injected -weak or tired This list may not describe all possible side effects. Call your doctor for medical advice about side effects. You may report side effects to FDA at 1-800-FDA-1088. Where should I keep my medicine? This vaccine is only given in a clinic, pharmacy, doctor's office, or other health care setting and will not be stored at home.  NOTE: This sheet is a summary. It may not cover all possible information. If you have questions about this medicine, talk to your doctor, pharmacist, or health  care provider.  2018 Elsevier/Gold Standard (2008-04-18 09:30:40)

## 2017-07-31 LAB — HEPATIC FUNCTION PANEL
ALT: 19 IU/L (ref 0–32)
AST: 19 IU/L (ref 0–40)
Albumin: 4.1 g/dL (ref 3.5–5.5)
Alkaline Phosphatase: 81 IU/L (ref 39–117)
BILIRUBIN, DIRECT: 0.11 mg/dL (ref 0.00–0.40)
Bilirubin Total: 0.3 mg/dL (ref 0.0–1.2)
TOTAL PROTEIN: 7.5 g/dL (ref 6.0–8.5)

## 2017-08-02 ENCOUNTER — Other Ambulatory Visit: Payer: Self-pay | Admitting: Pulmonary Disease

## 2017-08-02 MED FILL — BENZONATATE 100 MG CAPSULE: 100 | 20 days supply | Qty: 60 | Fill #0

## 2017-08-03 MED FILL — AMLODIPINE BESYLATE 10 MG T: 10 | 30 days supply | Qty: 30 | Fill #10

## 2017-08-03 MED FILL — raNITIdine HCL 150 MG TABS: 150 | 30 days supply | Qty: 30 | Fill #5

## 2017-08-06 MED FILL — LINZESS 290 MCG CAPSULE: 290 | 30 days supply | Qty: 30 | Fill #5

## 2017-08-06 MED FILL — AMITRIPTYLINE HCL 50 MG TAB: 50 | 30 days supply | Qty: 30 | Fill #7

## 2017-08-07 NOTE — Progress Notes (Signed)
lfts normal. Repeat in six months.

## 2017-08-09 ENCOUNTER — Encounter: Payer: Medicaid Other | Attending: Internal Medicine | Admitting: Registered"

## 2017-08-09 ENCOUNTER — Encounter: Payer: Self-pay | Admitting: Registered"

## 2017-08-09 ENCOUNTER — Other Ambulatory Visit: Payer: Self-pay

## 2017-08-09 DIAGNOSIS — K219 Gastro-esophageal reflux disease without esophagitis: Secondary | ICD-10-CM | POA: Diagnosis not present

## 2017-08-09 DIAGNOSIS — Z713 Dietary counseling and surveillance: Secondary | ICD-10-CM | POA: Diagnosis not present

## 2017-08-09 DIAGNOSIS — R0609 Other forms of dyspnea: Secondary | ICD-10-CM | POA: Insufficient documentation

## 2017-08-09 MED FILL — MONTELUKAST SOD 10 MG TAB: 10 | 30 days supply | Qty: 30 | Fill #1

## 2017-08-09 NOTE — Patient Instructions (Addendum)
Continue working to get in 3 meals per day.   Recommend trying to have lighter foods at breakfast to help with reflux and preventing being too full at lunchtime. Could try having [place] yogurt with fruit and some unsalted peanuts or a piece of whole grain toast, could do a cereal with a good source of fiber (at least 3g) and low in added sugar with non-dairy milk (Ripple or Soy), unsweetened oatmeal with 1-2 boiled eggs, and fruit or whole wheat toast, etc.   For meals-try to include more nonstarchy vegetables and whole grains    Continue working to include regular physical activity. Recommend working toward adding in one more day of activity every two weeks until you reach at least 5 days per week of activity.    If you feel you are wanting to snack due to boredom do a fun activity for at least 30 minutes.

## 2017-08-09 NOTE — Progress Notes (Signed)
Medical Nutrition Therapy:  Appt start time: 1006 end time:  5462.   Assessment:  Primary concerns today: Pt referred due to GERD. Pt reports she has still been struggling with skipping lunch. She reports she often still feels full from breakfast at lunchtime. Pt reports her reflux has still been giving her problems and she has been trying to adjust to changes in medications. She says she talked with her doctor about bariatric surgery and her physician recommended she try to change her current dietary habits/include healthier eating habits before considering surgery. Pt reports she still has cravings for sweets. Pt reports she has been taking the fish oil supplement discussed at last appointment each day.   Preferred Learning Style:  No preference indicated   Learning Readiness:  Ready  MEDICATIONS: See list.    DIETARY INTAKE:  Usual eating pattern includes 2 meals and 1-2 snacks per day. Meals at home are usually eaten in the livingroom with her husband while watching TV. Pt says she feels she is a slow eater. Pt says she really likes ice cream and sweets. Typical breakfast includes grits, bacon, sausage, eggs, and fruit.   Everyday foods vary.  Avoided foods include none reported.    24-hr recall: 11/4 B ( AM): (830-900 AM) yogurt, crackers Snk ( AM): None reported.  L (11 AM):Egg Mcmuffin  Snk ( PM): None reported.  D (6 PM): string beans, potato salad, sweet potatoes, brown rice, neck bones Snk ( PM): 1.5 cups of ice cream Beverages: ~48 oz water, ~16 oz Ginger Ale   Usual physical activity: Pt has been going to [employer] to shop and walk about 2-3 times per week for about 1 hour at a time. She reports her husband will park far from the entrance to increase their amount of walking. Pt has starting doing the chair exercises discussed at last appointment about 3 days per week.   Progress Towards Goal(s):  In progress.   Nutritional Diagnosis:  NI-5.11.1 Predicted suboptimal  nutrient intake As related to skipping meals .  As evidenced by pt's reported dietary recall and habits .    Intervention:  Nutrition counseling provided. Dietitian provided education on balanced nutrition and the importance of getting in regular nutrition/not skipping meals. Dietitian discussed with pt how skipping meals slows metabolism and can lead to weight gain as well as becoming overly hungry. Discussed having a lighter breakfast as pt's usual breakfast contains a high amount of fat which takes longer to digest which can lead to prolonged fullness as well as worsen reflux. Discussed possible alternative breakfast ideas. Also encouraged pt to include some more non-starchy vegetables and whole grains at meals. Praised pt for starting chair exercises and discussed how slow progress is still progress. Discussed strategies to avoid wanting to snack due to boredom rather than hunger and forming associations between boredom and activities rather than boredom and snacking. Pt appeared agreeable to information/goals discussed.   Goals:   Continue working to get in 3 meals per day.   Recommend trying to have lighter foods at breakfast to help with reflux and preventing being too full at lunchtime. Could try having [place] yogurt with fruit and some unsalted peanuts or a piece of whole grain toast, could do a cereal with a good source of fiber (at least 3g) and low in added sugar with non-dairy milk (Ripple or Soy), unsweetened oatmeal with 1-2 boiled eggs, and fruit or whole wheat toast, etc.   For meals-try to include more nonstarchy  vegetables and whole grains    Continue working to include regular physical activity. Recommend working toward adding in one more day of activity every one to two weeks until you reach at least 5 days per week of activity.    If you feel you are wanting to snack due to boredom do a fun activity for at least 30 minutes.  Teaching Method  Utilized: Visual Auditory  Barriers to learning/adherence to lifestyle change: None indicated.  Demonstrated degree of understanding via:  Teach Back   Monitoring/Evaluation:  Dietary intake, exercise, and body weight in 2 month(s).

## 2017-08-11 MED FILL — LEVOTHYROXINE 88 MCG TABLET: 88 | 30 days supply | Qty: 30 | Fill #0

## 2017-08-11 MED FILL — GABAPENTIN 300 MG CAPSULE: 300 | 30 days supply | Qty: 180 | Fill #3

## 2017-08-11 NOTE — Telephone Encounter (Signed)
Referral sent on 07/13/17 to  [facility] per patient request

## 2017-08-12 MED FILL — hydrALAZINE HCL 25 MG TABS: 25 | 30 days supply | Qty: 90 | Fill #1

## 2017-08-12 MED FILL — BUPROPION HCL XL 300 MG TAB: 300 | 30 days supply | Qty: 30 | Fill #4

## 2017-08-13 ENCOUNTER — Other Ambulatory Visit: Payer: Self-pay

## 2017-08-13 DIAGNOSIS — M25562 Pain in left knee: Secondary | ICD-10-CM | POA: Diagnosis not present

## 2017-08-13 DIAGNOSIS — M542 Cervicalgia: Secondary | ICD-10-CM | POA: Diagnosis not present

## 2017-08-13 DIAGNOSIS — M79642 Pain in left hand: Secondary | ICD-10-CM | POA: Diagnosis not present

## 2017-08-13 DIAGNOSIS — M25522 Pain in left elbow: Secondary | ICD-10-CM | POA: Diagnosis not present

## 2017-08-13 DIAGNOSIS — M25512 Pain in left shoulder: Secondary | ICD-10-CM | POA: Diagnosis not present

## 2017-08-13 DIAGNOSIS — M545 Low back pain: Secondary | ICD-10-CM | POA: Diagnosis not present

## 2017-08-13 DIAGNOSIS — M25511 Pain in right shoulder: Secondary | ICD-10-CM | POA: Diagnosis not present

## 2017-08-13 DIAGNOSIS — G894 Chronic pain syndrome: Secondary | ICD-10-CM | POA: Diagnosis not present

## 2017-08-13 MED ORDER — LOSARTAN POTASSIUM-HCTZ 100-25 MG PO TABS: 1.0000 | ORAL_TABLET | Freq: Every day | ORAL | 2 refills | Status: DC

## 2017-08-13 MED FILL — LOSARTAN-HCTZ 100-25 MG TAB: 100-25 | 30 days supply | Qty: 30 | Fill #0

## 2017-08-16 ENCOUNTER — Other Ambulatory Visit: Payer: Self-pay | Admitting: Pharmacist

## 2017-08-16 MED ORDER — METFORMIN HCL 500 MG PO TABS: 250.0000 mg | ORAL_TABLET | Freq: Every day | ORAL | 0 refills | Status: DC

## 2017-08-16 MED FILL — metFORMIN HCL 500 MG TABS: 500 | 30 days supply | Qty: 15 | Fill #0

## 2017-08-30 MED FILL — BENZONATATE 100 MG CAPSULE: 100 | 20 days supply | Qty: 60 | Fill #1

## 2017-08-30 MED FILL — OMEPRAZOLE DR 20 MG CAPSULE: 20 | 30 days supply | Qty: 60 | Fill #1

## 2017-09-02 MED FILL — AMLODIPINE BESYLATE 10 MG T: 10 | 30 days supply | Qty: 30 | Fill #11

## 2017-09-03 ENCOUNTER — Telehealth: Payer: Self-pay | Admitting: Pharmacist

## 2017-09-03 MED ORDER — AMITRIPTYLINE HCL 50 MG PO TABS: 50.0000 mg | ORAL_TABLET | Freq: Every day | ORAL | 3 refills | Status: DC

## 2017-09-03 MED FILL — AMITRIPTYLINE HCL 50 MG TAB: 50 | 30 days supply | Qty: 30 | Fill #0

## 2017-09-03 NOTE — Addendum Note (Signed)
Addended by: Karle Plumber B on: 09/03/2017 02:01 PM   Modules accepted: Orders

## 2017-09-03 NOTE — Telephone Encounter (Signed)
Received fax from [facility] pharmacy - patient is requesting refill on amitriptyline. Has not been prescribed by PCP, will need her approval. Will forward to Dr. Wynetta Emery.

## 2017-09-06 ENCOUNTER — Other Ambulatory Visit: Payer: Self-pay | Admitting: Internal Medicine

## 2017-09-06 ENCOUNTER — Other Ambulatory Visit: Payer: Self-pay | Admitting: Gastroenterology

## 2017-09-06 DIAGNOSIS — Z1231 Encounter for screening mammogram for malignant neoplasm of breast: Secondary | ICD-10-CM

## 2017-09-06 MED FILL — MONTELUKAST SOD 10 MG TAB: 10 | 30 days supply | Qty: 30 | Fill #2

## 2017-09-09 MED FILL — LINZESS 290 MCG CAPSULE: 290 | 30 days supply | Qty: 30 | Fill #0

## 2017-09-15 MED FILL — metFORMIN HCL 500 MG TABS: 500 | 30 days supply | Qty: 15 | Fill #1

## 2017-09-15 MED FILL — BUPROPION HCL XL 300 MG TAB: 300 | 30 days supply | Qty: 30 | Fill #5

## 2017-09-15 MED FILL — LEVOTHYROXINE 88 MCG TABLET: 88 | 30 days supply | Qty: 30 | Fill #1

## 2017-09-21 MED FILL — GABAPENTIN 300 MG CAPSULE: 300 | 30 days supply | Qty: 180 | Fill #4

## 2017-09-21 MED FILL — LOSARTAN-HCTZ 100-25 MG TAB: 100-25 | 30 days supply | Qty: 30 | Fill #1

## 2017-09-30 MED FILL — OMEPRAZOLE DR 20 MG CAPSULE: 20 | 30 days supply | Qty: 60 | Fill #2

## 2017-09-30 MED FILL — hydrALAZINE HCL 25 MG TABS: 25 | 30 days supply | Qty: 90 | Fill #2

## 2017-09-30 MED FILL — BENZONATATE 100 MG CAPSULE: 100 | 20 days supply | Qty: 60 | Fill #2

## 2017-10-07 MED FILL — AMITRIPTYLINE HCL 50 MG TAB: 50 | 30 days supply | Qty: 30 | Fill #1

## 2017-10-07 MED FILL — MONTELUKAST SOD 10 MG TAB: 10 | 30 days supply | Qty: 30 | Fill #3

## 2017-10-11 ENCOUNTER — Ambulatory Visit: Payer: Medicaid Other | Admitting: Registered"

## 2017-10-13 ENCOUNTER — Ambulatory Visit
Admission: RE | Admit: 2017-10-13 | Discharge: 2017-10-13 | Disposition: A | Discharge status: Home / Self Care | Payer: Medicaid Other | Source: Ambulatory Visit | Attending: Internal Medicine | Admitting: Internal Medicine

## 2017-10-13 DIAGNOSIS — Z1231 Encounter for screening mammogram for malignant neoplasm of breast: Secondary | ICD-10-CM | POA: Diagnosis not present

## 2017-10-13 MED FILL — metFORMIN HCL 500 MG TABS: 500 | 30 days supply | Qty: 15 | Fill #2

## 2017-10-13 MED FILL — LEVOTHYROXINE 88 MCG TABLET: 88 | 30 days supply | Qty: 30 | Fill #2

## 2017-10-13 MED FILL — LINZESS 290 MCG CAPSULE: 290 | 30 days supply | Qty: 30 | Fill #1

## 2017-10-14 ENCOUNTER — Encounter: Payer: Self-pay | Admitting: Registered"

## 2017-10-14 ENCOUNTER — Encounter: Payer: Medicaid Other | Attending: Internal Medicine | Admitting: Registered"

## 2017-10-14 ENCOUNTER — Other Ambulatory Visit: Payer: Self-pay

## 2017-10-14 DIAGNOSIS — R0609 Other forms of dyspnea: Secondary | ICD-10-CM | POA: Insufficient documentation

## 2017-10-14 DIAGNOSIS — K219 Gastro-esophageal reflux disease without esophagitis: Secondary | ICD-10-CM | POA: Diagnosis not present

## 2017-10-14 DIAGNOSIS — Z713 Dietary counseling and surveillance: Secondary | ICD-10-CM | POA: Diagnosis not present

## 2017-10-14 MED ORDER — AMLODIPINE BESYLATE 10 MG PO TABS: 10.0000 mg | ORAL_TABLET | Freq: Every day | ORAL | 3 refills | Status: DC

## 2017-10-14 MED ORDER — BUPROPION HCL ER (XL) 300 MG PO TB24: 300.0000 mg | ORAL_TABLET | ORAL | 3 refills | Status: DC

## 2017-10-14 MED FILL — BUPROPION HCL XL 300 MG TAB: 300 | 30 days supply | Qty: 30 | Fill #0

## 2017-10-14 NOTE — Progress Notes (Signed)
Medical Nutrition Therapy:  Appt start time: 1020 end time:  1050.   Assessment:  Primary concerns today: Pt referred due to GERD. Nutrition follow-up: Pt reports that she has been struggling with her nutrition-wanting to snack on sweets. Pt reports her GERD has been worse and she has been feeling bloated/very full after meals and is having stomach and back pain. She reports that she feels her reflux medication is not working as well as her last medication did and she is going to try to get an appointment with her doctor sooner as it is currently scheduled for March. Pt reports she has still been skipping lunch and has also been missing taking medications she is supposed to take at lunch. Pt reports that she often forgets to eat something for lunch because she is usually out running errands. Pt is still taking fish oil. Pt has started doing more physical activity through doing a workout video about 3 times per week for 15-30 minutes. Pt reports she feels better when she is more active.   Preferred Learning Style:  No preference indicated   Learning Readiness:  Ready  MEDICATIONS: See list.    DIETARY INTAKE:  Usual eating pattern includes 2 meals and 1-2 snacks per day. Meals at home are usually eaten in the livingroom with her husband while watching TV. Pt says she feels she is a slow eater. Pt says she really likes ice cream and sweets. Typical breakfast includes grits, bacon, sausage, eggs, yogurt, crackers, and fruit.   Everyday foods vary.  Avoided foods include none reported.    24-hr recall: 11/4 B ( AM): (830-900 AM) yogurt, crackers Snk ( AM): None reported.  L (11:30-12 AM): Egg McMuffin, hashbrowns, coffee  Snk ( PM): None reported.  D (6 PM): mashed potatoes, broccoli, small piece of steak  Snk ( PM): None reported.  Beverages: 1 cup of coffee, Ginger Ale, 2-4 bottles water per day.   Usual physical activity: Pt has still been going to [employer] to shop and walk. Pt has been  doing home workouts using a CD 3 times per week for about 15-30 minutes each time. Pt reports she has more energy if she does her workout CD.   Progress Towards Goal(s):  In progress.   Nutritional Diagnosis:  NI-5.11.1 Predicted suboptimal nutrient intake As related to skipping meals .  As evidenced by pt's reported dietary recall and habits .    Intervention:  Nutrition counseling provided. Counseled pt on importance getting in lunch each day/not skipping. Discussed strategies for ensuring pt gets lunch in-setting a reminder on her phone to eat lunch and to pack a lunch if she knows she will be out during lunchtime. Praised pt for including more physical activity and encouraged her to continue working to gradually include more activity. Discussed ways to reduce snacking on sweets-discussed having sweets in moderation and purchasing them in one serving amount so they are not sitting around the house. Pt appeared agreeable to information/goals discussed.   Goals:   Make sure to get in three meals per day. Try to have balanced meals like the My Plate example (see handout). Try to include more vegetables, fruits, and whole grains at meals.   Recommend setting a reminder on your phone to stop to have lunch around 12-1 pm. Also recommend packing a lunch to take with you if you know you are going to be out running errands during lunch time.   Great job with adding in more physical activity! Continue  working to increase activity. A great goal is to work toward including 30 minutes of physical activity 5 days each week or at least 150 minutes per week. Regular physical activity promotes overall health-including helping to reduce risk for heart disease and diabetes, promoting mental health, and helping [name] sleep better.    If you feel you are wanting to snack due to boredom do a fun activity for at least 30 minutes.  With sweets, if you are wanting to have some type of sweets-recommend getting a one  serving amount so that leftovers are not left around the house.    Teaching Method Utilized: Visual Auditory  Barriers to learning/adherence to lifestyle change: None indicated.  Demonstrated degree of understanding via:  Teach Back   Monitoring/Evaluation:  Dietary intake, exercise, and body weight in 2 month(s).

## 2017-10-14 NOTE — Patient Instructions (Addendum)
Make sure to get in three meals per day. Try to have balanced meals like the My Plate example (see handout). Try to include more vegetables, fruits, and whole grains at meals.   Recommend setting a reminder on your phone to stop to have lunch around 12-1 pm. Also recommend packing a lunch to take with you if you know you are going to be out running errands during lunch time.   Great job with adding in more physical activity! Continue working to increase activity. A great goal is to work toward including 30 minutes of physical activity 5 days each week or at least 150 minutes per week. Regular physical activity promotes overall health-including helping to reduce risk for heart disease and diabetes, promoting mental health, and helping [name] sleep better.    If you feel you are wanting to snack due to boredom do a fun activity for at least 30 minutes.  With sweets, if you are wanting to have some type of sweets-recommend getting a one serving amount so that leftovers are not left around the house.

## 2017-10-18 MED FILL — AMLODIPINE BESYLATE 10 MG T: 10 | 30 days supply | Qty: 30 | Fill #0

## 2017-10-19 ENCOUNTER — Other Ambulatory Visit: Payer: Self-pay

## 2017-10-19 ENCOUNTER — Encounter: Payer: Self-pay | Admitting: Adult Health

## 2017-10-19 ENCOUNTER — Ambulatory Visit: Payer: Medicaid Other | Admitting: Adult Health

## 2017-10-19 DIAGNOSIS — J309 Allergic rhinitis, unspecified: Secondary | ICD-10-CM

## 2017-10-19 DIAGNOSIS — J209 Acute bronchitis, unspecified: Secondary | ICD-10-CM | POA: Insufficient documentation

## 2017-10-19 MED ORDER — BENZONATATE 100 MG PO CAPS: ORAL_CAPSULE | ORAL | 3 refills | Status: DC

## 2017-10-19 MED ORDER — AZITHROMYCIN 250 MG PO TABS: ORAL_TABLET | ORAL | 0 refills | Status: AC

## 2017-10-19 MED ORDER — LEVALBUTEROL HCL 0.63 MG/3ML IN NEBU
0.6300 mg | INHALATION_SOLUTION | Freq: Once | RESPIRATORY_TRACT | Status: AC
  Administered 2017-10-19: 0.63 mg via RESPIRATORY_TRACT

## 2017-10-19 MED FILL — AZITHROMYCIN 250 MG TABLET: 250 | 5 days supply | Qty: 6 | Fill #0

## 2017-10-19 MED FILL — BENZONATATE 100 MG CAPSULE: 100 | 10 days supply | Qty: 30 | Fill #0

## 2017-10-19 NOTE — Progress Notes (Signed)
_0  ID: Robin [name], female    DOB: 1956/03/10, 62 y.o.   MRN: [id number]  Chief Complaint  Patient presents with  . Acute Visit    Coug h    Referring provider: Ladell Pier, MD  HPI: 62 year old female former smoker seen previously for dyspnea, chronic allergic rhinitis and hemoptysis (felt secondary to erosions from tonsils status post bronchoscopy)  TEST   PFT 02/09/17: FVC  2.35 L (77%) FEV1 2.06 L (85%) FEV1/FVC 0.88 FEF 25-75 3.32 L (142%) negative bronchodilator response TLC 3.74 L (68%) RV 71% ERV 28% DLCO corrected 71% 08/22/15: FVC 2.32 L (119%) FEV1 1.99 L (121%) FEV1/FVC 0.85 FEF 25-75 2.59 L (118%)  6MWT 02/16/17:  Walked 126 meters / Baseline Sat 99%  On RA / Nadir Sat 99% on RA  METHACHOLINE CHALLENGE TEST (02/24/17):  Normal bronchial hyperreactivity.  IMAGING BARIUM SWALLOW/ESOPHAGRAM 04/30/17 (per radiologist):  Hiatal hernia with mild reflux. Esophageal dysmotility  CT NECK/SOFT TISSUE W/ CONTRAST 04/19/17 (per radiologist): No inflammatory change or foreign body can be seen in the pharynx.  Cervical spondylosis. 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  CT CHEST W/O 01/07/17 (previously reviewed by me):  No parenchymal nodule, mass, or opacity appreciated. No pleural effusion or thickening. No pericardial effusion. No pathologic mediastinal adenopathy.  CTA CHEST 12/13/15 (previously reviewed by me): No pulmonary emboli. No pleural effusion or thickening. No pericardial effusion. No pathologic mediastinal adenopathy. No parenchymal nodule, mass, or opacification.  CARDIAC TTE (09/29/16): LV normal in size with moderate concentric hypertrophy. EF 55-60% with no regional wall motion abnormalities. Indeterminant diastolic function. LA & RA normal in size. RV normal in size and function. No aortic stenosis or regurgitation. Aortic root normal in size. No mitral stenosis or regurgitation. No significant  pulmonic regurgitation. No significant tricuspid regurgitation. No pericardial effusion.  LEFT HEART CATHETERIZATION (11/13/3):  Aortic systolic pressure 902  Diastolic pressure 90  Left ventricular systolic pressure 409  End-diastolic pressure 27 left main coronary artery normal  Left anterior descending artery normal  Left circumflex coronary artery normal  Right coronary artery dominant and normal   Overall estimated ejection fraction greater than 60% without focal wall motion abnormality  MICROBIOLOGY Endobronchial/Endotracheal Brushing 03/08/17: Rare Haemophilus influenza beta lactamase positive  PATHOLOGY Endobronchial/Endotracheal Brushing 03/08/17: No malignancy. Negative for herpes & HPV.  LABS 05/28/17 BNP:  6.1 ESR:  34 TSH:  1.060 Anti-CCP:  6 RF:  <10  02/16/17 IgE: 81 RAST panel: Cockroach 0.2 / D. farinae 0.2 / D. pteronyssinus 0.24   12/23/16 INR: 1.0 PTT: 24.1 Chromatin Ab:  <0.2 Smith Ab: <0.2 DS DNA Ab:  <1 SSA:  <0.2 SSB:  <0.2 Anti-CCP:  <16 SCL-70:  <0.2 ENA RNP Ab: Centromere Ab Screen:  <0.2 Jo-1 Ab:  <0.2  05/12/16 ANA: Negative Rheumatoid factor:  <10   10/19/2017 Acute OV : Cough  Patient presents for an acute office visit.  Patient complains of 2 weeks of increased cough congestion and sinus drainage, low grade fever. Taking claritin and tessalon without much better.  Appetite is good. No n/v/d. Wheezing is getting worse. Cough and wheezing worse last 2 days.  Cough is aggravating would like refill of tessalon .  No chest pain, orthopnea , increased edema or hemoptysis .     Allergies  Allergen Reactions  . Celexa [Citalopram Hydrobromide] Other (See Comments)    "Made me feel out of it"    Immunization History  Administered Date(s) Administered  .  Influenza,inj,Quad PF,6+ Mos 08/05/2016, 07/30/2017  . Pneumococcal Polysaccharide-23 08/05/2016  . Tdap 08/05/2016    Past Medical History:  Diagnosis Date  . Anxiety  disorder   . Arthritis   . Asthma   . Chronic neck pain   . Constipation   . Diabetes mellitus without complication ([place])   . GERD (gastroesophageal reflux disease)   . Hiatal hernia    small  . Hyperlipidemia   . Hypertension   . Schatzki's ring    non critical  . Sleep apnea    CPAP, Sleep study at [place]  . Sleep apnea     Tobacco History: Social History   Tobacco Use  Smoking Status Former Smoker  . Packs/day: 0.50  . Years: 10.00  . Pack years: 5.00  . Types: Cigarettes  . Start date: 07/14/1975  . Last attempt to quit: 04/04/1993  . Years since quitting: 24.5  Smokeless Tobacco Never Used  Tobacco Comment   17 yrs ago   Counseling given: Not Answered Comment: 17 yrs ago   Outpatient Encounter Medications as of 10/19/2017  Medication Sig  . acetaminophen (TYLENOL) 500 MG tablet Take 1 tablet (500 mg total) by mouth every 6 (six) hours as needed for headache. (Patient taking differently: Take 1,000 mg by mouth every 6 (six) hours as needed for headache. )  . amitriptyline (ELAVIL) 50 MG tablet Take 1 tablet (50 mg total) by mouth at bedtime.  Marland Kitchen amLODipine (NORVASC) 10 MG tablet Take 1 tablet (10 mg total) by mouth daily.  Marland Kitchen aspirin 81 MG tablet Take 1 tablet (81 mg total) by mouth at bedtime. Reported on 12/09/2015  . benzonatate (TESSALON) 100 MG capsule TAKE 1 CAPSULE BY MOUTH 3 TIMES DAILY AS NEEDED FOR COUGH.  . buPROPion (WELLBUTRIN XL) 300 MG 24 hr tablet Take 1 tablet (300 mg total) by mouth every morning. Reported on 12/09/2015  . diclofenac sodium (VOLTAREN) 1 % GEL Apply 2 g topically 4 (four) times daily.  . fexofenadine (ALLEGRA) 180 MG tablet Take 1 tablet (180 mg total) by mouth daily. Reported on 12/09/2015  . fluticasone (FLONASE) 50 MCG/ACT nasal spray Place 2 sprays into both nostrils daily.  Marland Kitchen gabapentin (NEURONTIN) 300 MG capsule Take 2 capsules (600 mg total) by mouth 3 (three) times daily.  . Glucose Blood (BLOOD GLUCOSE TEST STRIPS)  STRP Use as directed. One Touch.  . hydrALAZINE (APRESOLINE) 25 MG tablet Take 1 tablet (25 mg total) by mouth 3 (three) times daily.  . Hydrocodone-Acetaminophen (NORCO PO) Take by mouth.  Marland Kitchen ipratropium (ATROVENT) 0.06 % nasal spray Place 2 sprays into both nostrils 4 (four) times daily. (Patient taking differently: Place 1 spray into both nostrils 4 (four) times daily. )  . levothyroxine (SYNTHROID, LEVOTHROID) 88 MCG tablet Take 1 tablet (88 mcg total) by mouth daily.  Marland Kitchen LINZESS 290 MCG CAPS capsule TAKE 1 CAPSULE BY MOUTH DAILY BEFORE BREAKFAST.  Marland Kitchen loratadine (CLARITIN) 10 MG tablet Take 1 tablet (10 mg total) by mouth daily. (Patient taking differently: Take 10 mg by mouth daily as needed for allergies. )  . losartan-hydrochlorothiazide (HYZAAR) 100-25 MG tablet Take 1 tablet daily by mouth. Reported on 12/09/2015  . MELOXICAM PO Take by mouth.  . metFORMIN (GLUCOPHAGE) 500 MG tablet Take 0.5 tablets (250 mg total) daily with breakfast by mouth.  . methocarbamol (ROBAXIN) 500 MG tablet Take 1 tablet (500 mg total) by mouth every 8 (eight) hours as needed for muscle spasms. (Patient taking differently:  Take 500 mg by mouth at bedtime as needed for muscle spasms. )  . montelukast (SINGULAIR) 10 MG tablet Take 1 tablet (10 mg total) by mouth at bedtime.  . Multiple Vitamin (MULTIVITAMIN) tablet Take 1 tablet by mouth daily. Reported on 12/09/2015  . Omeprazole 20 MG TBEC Take 1 tablet (20 mg total) by mouth 2 (two) times daily before a meal.  . traMADol (ULTRAM) 50 MG tablet Take 1 tablet (50 mg total) by mouth 2 (two) times daily as needed.  Marland Kitchen azithromycin (ZITHROMAX Z-PAK) 250 MG tablet Take 2 tablets (500 mg) on  Day 1,  followed by 1 tablet (250 mg) once daily on Days 2 through 5.   No facility-administered encounter medications on file as of 10/19/2017.      Review of Systems  Constitutional:   No  weight loss, night sweats,   +Fevers, chills, fatigue, or  lassitude.  HEENT:   No  headaches,  Difficulty swallowing,  Tooth/dental problems, or  Sore throat,                No sneezing, itching, ear ache, + nasal congestion, post nasal drip,   CV:  No chest pain,  Orthopnea, PND, swelling in lower extremities, anasarca, dizziness, palpitations, syncope.   GI  No heartburn, indigestion, abdominal pain, nausea, vomiting, diarrhea, change in bowel habits, loss of appetite, bloody stools.   Resp:    No chest wall deformity  Skin: no rash or lesions.  GU: no dysuria, change in color of urine, no urgency or frequency.  No flank pain, no hematuria   MS:  No joint pain or swelling.  No decreased range of motion.  No back pain.    Physical Exam  BP 140/88 (BP Location: Left Arm, Cuff Size: Normal)   Pulse 98   Temp 99.6 F (37.6 C) (Oral)   Ht _0  (1.727 m)   Wt (!) 321 lb 12.8 oz (146 kg)   SpO2 99%   BMI 48.93 kg/m   GEN: A/Ox3; pleasant , NAD, obese    HEENT:  [place]/AT,  EACs-clear, TMs-wnl, NOSE-clear drainage  THROAT-clear, no lesions, no postnasal drip or exudate noted.   NECK:  Supple w/ fair ROM; no JVD; normal carotid impulses w/o bruits; no thyromegaly or nodules palpated; no lymphadenopathy.    RESP  Clear  P & A; w/o, wheezes/ rales/ or rhonchi. no accessory muscle use, no dullness to percussion  CARD:  RRR, no m/r/g, none to trace  peripheral edema, pulses intact, no cyanosis or clubbing.  GI:   Soft & nt; nml bowel sounds; no organomegaly or masses detected.   Musco: Warm bil, no deformities or joint swelling noted.   Neuro: alert, no focal deficits noted.    Skin: Warm, no lesions or rashes    Lab Results:  BMET  BNP  ProBNP No results found for: PROBNP  Imaging:    Assessment & Plan:   Acute bronchitis Flare  xopenex neb x 1 in office   Plan  Patient Instructions  Zpack take as directed.  Mucinex DM Twice daily  As needed  Cough/congestion  Fluids and rest .  Tylenol As needed   Continue on Allegra and Flonase .    Follow up with Dr. Vaughan Browner and As needed   .Please contact office for sooner follow up if symptoms do not improve or worsen or seek emergency care       Chronic allergic rhinitis Mild flare  Plan  Patient Instructions  Zpack take as directed.  Mucinex DM Twice daily  As needed  Cough/congestion  Fluids and rest .  Tylenol As needed   Continue on Allegra and Flonase .  Follow up with Dr. Vaughan Browner and As needed   .Please contact office for sooner follow up if symptoms do not improve or worsen or seek emergency care          Rexene Edison, NP 10/19/2017

## 2017-10-19 NOTE — Addendum Note (Signed)
Addended by: Dolores Lory on: 10/19/2017 09:38 AM   Modules accepted: Orders

## 2017-10-19 NOTE — Assessment & Plan Note (Signed)
Flare  xopenex neb x 1 in office   Plan  Patient Instructions  Zpack take as directed.  Mucinex DM Twice daily  As needed  Cough/congestion  Fluids and rest .  Tylenol As needed   Continue on Allegra and Flonase .  Follow up with Dr. Vaughan Browner and As needed   .Please contact office for sooner follow up if symptoms do not improve or worsen or seek emergency care

## 2017-10-19 NOTE — Assessment & Plan Note (Signed)
Mild flare  Plan  Patient Instructions  Zpack take as directed.  Mucinex DM Twice daily  As needed  Cough/congestion  Fluids and rest .  Tylenol As needed   Continue on Allegra and Flonase .  Follow up with Dr. Vaughan Browner and As needed   .Please contact office for sooner follow up if symptoms do not improve or worsen or seek emergency care

## 2017-10-19 NOTE — Patient Instructions (Signed)
Zpack take as directed.  Mucinex DM Twice daily  As needed  Cough/congestion  Fluids and rest .  Tylenol As needed   Continue on Allegra and Flonase .  Follow up with Dr. Vaughan Browner and As needed   .Please contact office for sooner follow up if symptoms do not improve or worsen or seek emergency care

## 2017-10-21 ENCOUNTER — Encounter (INDEPENDENT_AMBULATORY_CARE_PROVIDER_SITE_OTHER): Payer: Medicaid Other

## 2017-10-26 ENCOUNTER — Telehealth (INDEPENDENT_AMBULATORY_CARE_PROVIDER_SITE_OTHER): Payer: Self-pay | Admitting: Orthopaedic Surgery

## 2017-10-26 MED FILL — LOSARTAN-HCTZ 100-25 MG TAB: 100-25 | 30 days supply | Qty: 30 | Fill #2

## 2017-10-26 NOTE — Telephone Encounter (Signed)
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

## 2017-10-26 NOTE — Telephone Encounter (Signed)
Patient want to see about getting set up for physical therapy. CB # [phone]

## 2017-10-26 NOTE — Telephone Encounter (Signed)
See message below. If so where and what orders?

## 2017-10-27 ENCOUNTER — Other Ambulatory Visit (INDEPENDENT_AMBULATORY_CARE_PROVIDER_SITE_OTHER): Payer: Self-pay

## 2017-10-27 DIAGNOSIS — M25562 Pain in left knee: Secondary | ICD-10-CM

## 2017-10-27 DIAGNOSIS — M25552 Pain in left hip: Secondary | ICD-10-CM

## 2017-10-27 DIAGNOSIS — G8929 Other chronic pain: Secondary | ICD-10-CM

## 2017-10-27 NOTE — Telephone Encounter (Signed)
Order made through epic- [facility]

## 2017-11-04 MED FILL — OMEPRAZOLE DR 20 MG CAPSULE: 20 | 30 days supply | Qty: 60 | Fill #3

## 2017-11-08 MED FILL — MONTELUKAST SOD 10 MG TAB: 10 | 30 days supply | Qty: 30 | Fill #4

## 2017-11-08 MED FILL — AMITRIPTYLINE HCL 50 MG TAB: 50 | 30 days supply | Qty: 30 | Fill #2

## 2017-11-08 MED FILL — GABAPENTIN 300 MG CAPSULE: 300 | 30 days supply | Qty: 180 | Fill #5

## 2017-11-10 ENCOUNTER — Ambulatory Visit: Payer: Medicaid Other | Admitting: Physical Therapy

## 2017-11-11 ENCOUNTER — Encounter: Payer: Self-pay | Admitting: Physical Therapy

## 2017-11-11 ENCOUNTER — Ambulatory Visit: Payer: Medicaid Other | Attending: Orthopaedic Surgery | Admitting: Physical Therapy

## 2017-11-11 DIAGNOSIS — M25562 Pain in left knee: Secondary | ICD-10-CM | POA: Insufficient documentation

## 2017-11-11 DIAGNOSIS — R262 Difficulty in walking, not elsewhere classified: Secondary | ICD-10-CM | POA: Diagnosis present

## 2017-11-11 DIAGNOSIS — M25561 Pain in right knee: Secondary | ICD-10-CM | POA: Diagnosis present

## 2017-11-11 DIAGNOSIS — M25552 Pain in left hip: Secondary | ICD-10-CM | POA: Diagnosis present

## 2017-11-11 DIAGNOSIS — G8929 Other chronic pain: Secondary | ICD-10-CM | POA: Insufficient documentation

## 2017-11-11 MED FILL — BUPROPION HCL XL 300 MG TAB: 300 | 30 days supply | Qty: 30 | Fill #1

## 2017-11-11 NOTE — Therapy (Signed)
[address] [place], [place], [address] Phone: [phone]   Fax:  [phone]  Physical Therapy Evaluation  Patient Details  Name: Robin [name] MRN: [id number] Date of Birth: 07/15/1956 Referring Provider: Erlinda Hong   Encounter Date: 11/11/2017  PT End of Session - 11/11/17 1434    Visit Number  1    Date for PT Re-Evaluation  01/09/18    PT Start Time  0938    PT Stop Time  1829    PT Time Calculation (min)  60 min    Activity Tolerance  Patient limited by pain;Patient limited by fatigue    Behavior During Therapy  [facility] for tasks assessed/performed       Past Medical History:  Diagnosis Date  . Anxiety disorder   . Arthritis   . Asthma   . Chronic neck pain   . Constipation   . Diabetes mellitus without complication ([place])   . GERD (gastroesophageal reflux disease)   . Hiatal hernia    small  . Hyperlipidemia   . Hypertension   . Schatzki's ring    non critical  . Sleep apnea    CPAP, Sleep study at [place]  . Sleep apnea     Past Surgical History:  Procedure Laterality Date  . ABDOMINAL HYSTERECTOMY    . ABDOMINAL HYSTERECTOMY  02/18/2000  . CARDIAC CATHETERIZATION  08/17/2001   normal L main/LAD/L Cfx/RCA (Dr. Adora Fridge)  . COLONOSCOPY  2006   Dr. [name], hyperplastic polyps  . COLONOSCOPY  08/06/11   abnormal terminal ileum for 10cm, erosions, geographical ulceration. Bx small bowel mucosa with prominent intramucosal lymphoid aggregates, slightly inflammed  . DIRECT LARYNGOSCOPY  03/15/2012   Procedure: DIRECT LARYNGOSCOPY;  Surgeon: [name], MD;  Location: [place];  Service: ENT;  Laterality: N/A; Dr. Wolicki--:>no foreign body seen. normal esophagus to 40cm  . ESOPHAGEAL DILATION  04/17/2015   Procedure: ESOPHAGEAL DILATION;  Surgeon: [name], MD;  [facility];  Service: Endoscopy;;  . ESOPHAGOGASTRODUODENOSCOPY  08/23/2008   HBZ:JIRC distal  esophageal erosions consistent with mild erosive reflux esophagitis, otherwise unremarkable esophagus/ Tiny antral erosions of doubtful clinical significance, otherwise normal stomach, patent pylorus, normal D1 and D2  . ESOPHAGOGASTRODUODENOSCOPY  08/06/11   small hh, noncritical Schatzki's ring s/p 89 F  . ESOPHAGOGASTRODUODENOSCOPY N/A 04/17/2015   VEL:FYBOFB s/p dilation  . ESOPHAGOSCOPY  03/15/2012   Procedure: ESOPHAGOSCOPY;  Surgeon: [name], MD;  Location: [place];  Service: ENT;  Laterality: N/A;  . KNEE ARTHROSCOPY  04/27/2001  . NM MYOCAR PERF WALL MOTION  2003   negative bruce protocol exercise tress test; EF 68%; intermediate risk study due to evidence of anterior wall ischemia extending from mid-ventricle to apex  . 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 RECTOCELE REPAIR    . TRANSTHORACIC ECHOCARDIOGRAM  2003   EF normal  . VAGINAL PROLAPSE REPAIR    . VIDEO BRONCHOSCOPY Bilateral 03/08/2017   Procedure: VIDEO BRONCHOSCOPY WITHOUT FLUORO;  Surgeon: Javier Glazier, MD;  Location: [place];  Service: Cardiopulmonary;  Laterality: Bilateral;    There were no vitals filed for this visit.   Subjective Assessment - 11/11/17 1355    Subjective  Patient reports that she was diagnosed with OA in 2002, she reports that she has been diagnosed with OA in many joints, reports that her knee Left > right, and hips  have been getting much worse, to the point she is having a lot of difficulty walking.    Limitations  Standing;Walking    How long can you sit comfortably?  no limitation    How long can you stand comfortably?  less than 2 minutes, reports that she will normally bend over and hang onto the sink    How long can you walk comfortably?  30 feet and then rest, 100 feet before needing to sit down    Patient Stated Goals  have less pain, walk better    Currently in Pain?  Yes    Pain Score  7     Pain Location  Knee pain in the back, bilateral knees  and bilateral hips    Pain Orientation  Left;Right    Pain Descriptors / Indicators  Aching;Throbbing    Pain Type  Chronic pain    Pain Radiating Towards  reports some pain in the left posterior thigh and the buttock    Pain Onset  More than a month ago    Pain Frequency  Constant    Aggravating Factors   10/10 with acitivity, standing and walking, trying to do housework    Pain Relieving Factors  reports heat helps a little, pain at best 7/10    Effect of Pain on Daily Activities  reports limits everything         [facility] PT Assessment - 11/11/17 0001      Assessment   Medical Diagnosis  bilateral hip and knee pain with OA    Referring Provider  Xu    Onset Date/Surgical Date  10/11/17    Prior Therapy  no      Precautions   Precautions  None      Balance Screen   Has the patient fallen in the past 6 months  No    Has the patient had a decrease in activity level because of a fear of falling?   No    Is the patient reluctant to leave their home because of a fear of falling?   No      Home Environment   Additional Comments  one step into the home, does some cooking      Prior Function   Level of Independence  Independent with household mobility with device;Needs assistance with homemaking    Vocation  On disability    Leisure  no exercise, likes to read      ROM / Strength   AROM / PROM / Strength  AROM;Strength      AROM   Overall AROM Comments  left knee 24-95 degrees flexion, right knee 10-95 degrees flexion, hips to 40 degrees flexion in standing due to pain when trying to stand on single leg      Strength   Overall Strength Comments  strength 4-/5 right LE, 3+/5 left LE      Flexibility   Soft Tissue Assessment /Muscle Length  yes    Hamstrings  very tight      Palpation   Palpation comment  crepitus in the left knee with active flexion and extension, she is tender and tight in the buttocks, ITB, GT and the calves, tender in the lumbar area       Ambulation/Gait   Gait Comments  use of a SPC, very slow, left knee tends to go into flexion with weight bearing looking as if it will give out., very slow gait, very short of breath after TUG  Standardized Balance Assessment   Standardized Balance Assessment  Timed Up and Go Test      Timed Up and Go Test   Normal TUG (seconds)  51             Objective measurements completed on examination: See above findings.              PT Education - 11/11/17 1433    Education provided  Yes    Education Details  while sitting asked her to do ankle pumps, marches and kicks 20 x about 5 times a day    Person(s) Educated  Patient    Methods  Explanation;Demonstration    Comprehension  Verbalized understanding       PT Short Term Goals - 11/11/17 1625      PT SHORT TERM GOAL #1   Title  independent with initial HEP    Time  3    Period  Weeks    Status  New        PT Long Term Goals - 11/11/17 1635      PT LONG TERM GOAL #1   Title  report ability to walk 150 feet without rest    Time  12    Period  Weeks    Status  New      PT LONG TERM GOAL #2   Title  report ability to stand 10 minutes to help with cooking a meal    Time  12    Period  Weeks    Status  New      PT LONG TERM GOAL #3   Title  decrease pain 25%    Time  12    Period  Weeks    Status  New      PT LONG TERM GOAL #4   Title  have knowledge and information about advanced HEP/or gym programs that are free and available to her    Time  12    Period  Weeks    Status  New             Plan - 11/11/17 1436    Clinical Impression Statement  Patient reports that she has been diagnosed with OA in multiple joints, reports that the knees and the hips are the worst, reports that the left knee is the worst of all, reports that over the past few years she has had more and more difficulty with any stnading or walking, limit on walking is about 30 feet before she has to rest and 2 minutes on  standing.Marland Kitchen  Her TUG time was 53 seconds, she struggles to walk and the left knee appears to want to give out, the left knee extension was limited to 25 degrees from full extension actively while sitting    Clinical Presentation  Evolving    Clinical Presentation due to:  multiple joints and comorbidities    Clinical Decision Making  Moderate    Rehab Potential  Good    PT Frequency  2x / week    PT Duration  8 weeks    PT Treatment/Interventions  ADLs/Self Care Home Management;Cryotherapy;Electrical Stimulation;Iontophoresis 4mg /ml Dexamethasone;Moist Heat;Gait training;Therapeutic exercise;Therapeutic activities;Functional mobility training;Stair training;Patient/family education;Manual techniques    PT Next Visit Plan  slowly get patient moving, may give TENS information    Consulted and Agree with Plan of Care  Patient       Patient will benefit from skilled therapeutic intervention in order to improve the following deficits and  impairments:  Abnormal gait, Decreased coordination, Decreased range of motion, Difficulty walking, Increased muscle spasms, Pain, Decreased activity tolerance, Improper body mechanics, Impaired flexibility, Postural dysfunction, Decreased strength, Decreased mobility  Visit Diagnosis: Chronic pain of left knee - Plan: PT plan of care cert/re-cert  Pain in left hip - Plan: PT plan of care cert/re-cert  Chronic pain of right knee - Plan: PT plan of care cert/re-cert  Difficulty in walking, not elsewhere classified - Plan: PT plan of care cert/re-cert     Problem List Patient Active Problem List   Diagnosis Date Noted  . Acute bronchitis 10/19/2017  . Immunization due 07/30/2017  . 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 Early satiety 06/16/2017  . Nausea without vomiting 06/16/2017  . Hx of iron deficiency anemia 05/28/2017  . Prediabetes 02/04/2017  . Chronic allergic rhinitis 12/23/2016  . Hemoptysis 12/23/2016  . Fibromyalgia 08/18/2016  . Chronic  lymphocytic thyroiditis 08/18/2016  . Depression 04/01/2016  . OSA (obstructive sleep apnea) 04/01/2016  . HTN (hypertension), benign 12/09/2015  . Hypothyroidism 12/09/2015  . Morbid obesity ([place]) 12/09/2015  . Constipation 05/27/2015  . Fatty liver 05/27/2015  . Abnormal small bowel biopsy 09/18/2011  . Lactose intolerance 09/18/2011  . GERD 01/21/2010    Sumner Boast., PT 11/11/2017, 4:41 PM  [address] [place], [place], [address] Phone: [phone]   Fax:  [phone]  Name: NATILEE GAUER MRN: [id number] Date of Birth: 1956/04/07

## 2017-11-15 ENCOUNTER — Other Ambulatory Visit: Payer: Self-pay | Admitting: Internal Medicine

## 2017-11-15 MED FILL — AMLODIPINE BESYLATE 10 MG T: 10 | 30 days supply | Qty: 30 | Fill #1

## 2017-11-15 MED FILL — metFORMIN HCL 500 MG TABS: 500 | 30 days supply | Qty: 15 | Fill #0

## 2017-11-15 MED FILL — hydrALAZINE HCL 25 MG TABS: 25 | 30 days supply | Qty: 90 | Fill #3

## 2017-11-15 MED FILL — LEVOTHYROXINE 88 MCG TABLET: 88 | 30 days supply | Qty: 30 | Fill #0

## 2017-11-17 ENCOUNTER — Ambulatory Visit: Payer: Medicaid Other | Admitting: Physical Therapy

## 2017-11-17 ENCOUNTER — Other Ambulatory Visit: Payer: Self-pay | Admitting: Otolaryngology

## 2017-11-17 ENCOUNTER — Encounter: Payer: Self-pay | Admitting: Physical Therapy

## 2017-11-17 DIAGNOSIS — R262 Difficulty in walking, not elsewhere classified: Secondary | ICD-10-CM

## 2017-11-17 DIAGNOSIS — G8929 Other chronic pain: Secondary | ICD-10-CM

## 2017-11-17 DIAGNOSIS — M25552 Pain in left hip: Secondary | ICD-10-CM

## 2017-11-17 DIAGNOSIS — M25562 Pain in left knee: Secondary | ICD-10-CM | POA: Diagnosis not present

## 2017-11-17 DIAGNOSIS — M25561 Pain in right knee: Secondary | ICD-10-CM

## 2017-11-17 NOTE — Therapy (Signed)
[address] [place], [place], [address] Phone: [phone]   Fax:  [phone]  Physical Therapy Treatment  Patient Details  Name: Robin [name] MRN: [id number] Date of Birth: [birth year] Referring Provider: Erlinda Hong   Encounter Date: 11/17/2017  PT End of Session - 11/17/17 1002    Visit Number  2    Date for PT Re-Evaluation  01/09/18    PT Start Time  0923    PT Stop Time  1018    PT Time Calculation (min)  55 min    Activity Tolerance  Patient limited by pain;Patient limited by fatigue    Behavior During Therapy  [facility] for tasks assessed/performed       Past Medical History:  Diagnosis Date  . Anxiety disorder   . Arthritis   . Asthma   . Chronic neck pain   . Constipation   . Diabetes mellitus without complication ([place])   . GERD (gastroesophageal reflux disease)   . Hiatal hernia    small  . Hyperlipidemia   . Hypertension   . Schatzki's ring    non critical  . Sleep apnea    CPAP, Sleep study at [place]  . Sleep apnea     Past Surgical History:  Procedure Laterality Date  . ABDOMINAL HYSTERECTOMY    . ABDOMINAL HYSTERECTOMY  02/18/2000  . CARDIAC CATHETERIZATION  08/17/2001   normal L main/LAD/L Cfx/RCA (Dr. Adora Fridge)  . COLONOSCOPY  2006   Dr. [name], hyperplastic polyps  . COLONOSCOPY  08/06/11   abnormal terminal ileum for 10cm, erosions, geographical ulceration. Bx small bowel mucosa with prominent intramucosal lymphoid aggregates, slightly inflammed  . DIRECT LARYNGOSCOPY  03/15/2012   Procedure: DIRECT LARYNGOSCOPY;  Surgeon: [name], MD;  Location: [place];  Service: ENT;  Laterality: N/A; Dr. Wolicki--:>no foreign body seen. normal esophagus to 40cm  . ESOPHAGEAL DILATION  04/17/2015   Procedure: ESOPHAGEAL DILATION;  Surgeon: [name], MD;  [facility];  Service: Endoscopy;;  . ESOPHAGOGASTRODUODENOSCOPY  08/23/2008   XFG:HWEX distal  esophageal erosions consistent with mild erosive reflux esophagitis, otherwise unremarkable esophagus/ Tiny antral erosions of doubtful clinical significance, otherwise normal stomach, patent pylorus, normal D1 and D2  . ESOPHAGOGASTRODUODENOSCOPY  08/06/11   small hh, noncritical Schatzki's ring s/p 44 F  . ESOPHAGOGASTRODUODENOSCOPY N/A 04/17/2015   HBZ:JIRCVE s/p dilation  . ESOPHAGOSCOPY  03/15/2012   Procedure: ESOPHAGOSCOPY;  Surgeon: [name], MD;  Location: [place];  Service: ENT;  Laterality: N/A;  . KNEE ARTHROSCOPY  04/27/2001  . NM MYOCAR PERF WALL MOTION  2003   negative bruce protocol exercise tress test; EF 68%; intermediate risk study due to evidence of anterior wall ischemia extending from mid-ventricle to apex  . PITUITARY SURGERY  06/2012   benign tumor, surgeon at [facility]  . RECTOCELE REPAIR    . TRANSTHORACIC ECHOCARDIOGRAM  2003   EF normal  . VAGINAL PROLAPSE REPAIR    . VIDEO BRONCHOSCOPY Bilateral 03/08/2017   Procedure: VIDEO BRONCHOSCOPY WITHOUT FLUORO;  Surgeon: Javier Glazier, MD;  Location: [place];  Service: Cardiopulmonary;  Laterality: Bilateral;    There were no vitals filed for this visit.  Subjective Assessment - 11/17/17 0931    Subjective  Patient reports knee pain a 9/10, reports just really hard to get moving    Currently in Pain?  Yes    Pain Score  9  Pain Location  Knee also c/o pain inthe elbows and shoulders    Pain Orientation  Left;Right    Pain Descriptors / Indicators  Aching    Aggravating Factors   cold wet weather, reports that yesterday was miserable                      [facility] Adult PT Treatment/Exercise - 11/17/17 0001      Exercises   Exercises  Knee/Hip      Knee/Hip Exercises: Aerobic   Nustep  level 3 x 5 minutes, did 86 steps      Knee/Hip Exercises: Seated   Long Arc Quad  Both;2 sets;10 reps    [facility]  20 reps    Other Seated Knee/Hip Exercises  ankle exercises on airex in sitting, red  tband HS curls, isometric hip abduction    Other Seated Knee/Hip Exercises  seated PWR moves to get her trunk and arms moving, seated scapular stabilization exercises with red tband.  On sit fit pelvic mobility      Modalities   Modalities  Moist Heat;Electrical Stimulation      Moist Heat Therapy   Number Minutes Moist Heat  15 Minutes    Moist Heat Location  Knee      Electrical Stimulation   Electrical Stimulation Location  both knees    Electrical Stimulation Action  premod    Electrical Stimulation Parameters  sitting    Electrical Stimulation Goals  Pain               PT Short Term Goals - 11/17/17 1004      PT SHORT TERM GOAL #1   Title  independent with initial HEP    Status  On-going        PT Long Term Goals - 11/11/17 1635      PT LONG TERM GOAL #1   Title  report ability to walk 150 feet without rest    Time  12    Period  Weeks    Status  New      PT LONG TERM GOAL #2   Title  report ability to stand 10 minutes to help with cooking a meal    Time  12    Period  Weeks    Status  New      PT LONG TERM GOAL #3   Title  decrease pain 25%    Time  12    Period  Weeks    Status  New      PT LONG TERM GOAL #4   Title  have knowledge and information about advanced HEP/or gym programs that are free and available to her    Time  12    Period  Weeks    Status  New            Plan - 11/17/17 1002    Clinical Impression Statement  Patient with a lot of c/o pain with any activity, she did all exercises that I asked but has to go slow.  Only had 86 steps in a 5 minute time on the Nustep.  I have educated her on the need to get moving to have healthier joints and stronger mms for healthier joints    PT Next Visit Plan  Gave TENS information, try to get more stuff that seh can do at home    Consulted and Agree with Plan of Care  Patient  Patient will benefit from skilled therapeutic intervention in order to improve the following deficits and  impairments:  Abnormal gait, Decreased coordination, Decreased range of motion, Difficulty walking, Increased muscle spasms, Pain, Decreased activity tolerance, Improper body mechanics, Impaired flexibility, Postural dysfunction, Decreased strength, Decreased mobility  Visit Diagnosis: Chronic pain of left knee  Pain in left hip  Chronic pain of right knee  Difficulty in walking, not elsewhere classified     Problem List Patient Active Problem List   Diagnosis Date Noted  . Acute bronchitis 10/19/2017  . Immunization due 07/30/2017  . 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 Early satiety 06/16/2017  . Nausea without vomiting 06/16/2017  . Hx of iron deficiency anemia 05/28/2017  . Prediabetes 02/04/2017  . Chronic allergic rhinitis 12/23/2016  . Hemoptysis 12/23/2016  . Fibromyalgia 08/18/2016  . Chronic lymphocytic thyroiditis 08/18/2016  . Depression 04/01/2016  . OSA (obstructive sleep apnea) 04/01/2016  . HTN (hypertension), benign 12/09/2015  . Hypothyroidism 12/09/2015  . Morbid obesity ([place]) 12/09/2015  . Constipation 05/27/2015  . Fatty liver 05/27/2015  . Abnormal small bowel biopsy 09/18/2011  . Lactose intolerance 09/18/2011  . GERD 01/21/2010    Sumner Boast., PT 11/17/2017, 10:04 AM  [address] [place], [place], [address] Phone: [phone]   Fax:  [phone]  Name: TANYIAH LAURICH MRN: [id number] Date of Birth: 16-Nov-1955

## 2017-11-18 ENCOUNTER — Other Ambulatory Visit: Payer: Self-pay | Admitting: Otolaryngology

## 2017-11-18 DIAGNOSIS — R1319 Other dysphagia: Secondary | ICD-10-CM

## 2017-11-18 DIAGNOSIS — K219 Gastro-esophageal reflux disease without esophagitis: Secondary | ICD-10-CM

## 2017-11-18 DIAGNOSIS — R1310 Dysphagia, unspecified: Secondary | ICD-10-CM

## 2017-11-22 ENCOUNTER — Ambulatory Visit
Admission: RE | Admit: 2017-11-22 | Discharge: 2017-11-22 | Disposition: A | Discharge status: Home / Self Care | Payer: Medicaid Other | Source: Ambulatory Visit | Attending: Otolaryngology | Admitting: Otolaryngology

## 2017-11-22 DIAGNOSIS — K219 Gastro-esophageal reflux disease without esophagitis: Secondary | ICD-10-CM

## 2017-11-22 DIAGNOSIS — R1319 Other dysphagia: Secondary | ICD-10-CM

## 2017-11-22 DIAGNOSIS — R1310 Dysphagia, unspecified: Secondary | ICD-10-CM

## 2017-11-24 ENCOUNTER — Ambulatory Visit: Payer: Medicaid Other | Admitting: Physical Therapy

## 2017-11-25 ENCOUNTER — Ambulatory Visit: Payer: Medicaid Other | Attending: Internal Medicine | Admitting: Internal Medicine

## 2017-11-25 ENCOUNTER — Encounter: Payer: Self-pay | Admitting: Internal Medicine

## 2017-11-25 VITALS — BP 135/76 | HR 77 | Temp 98.2°F | Resp 16 | Ht 68.0 in | Wt 322.8 lb

## 2017-11-25 DIAGNOSIS — E063 Autoimmune thyroiditis: Secondary | ICD-10-CM | POA: Diagnosis not present

## 2017-11-25 DIAGNOSIS — R509 Fever, unspecified: Secondary | ICD-10-CM | POA: Diagnosis present

## 2017-11-25 DIAGNOSIS — J069 Acute upper respiratory infection, unspecified: Secondary | ICD-10-CM | POA: Diagnosis not present

## 2017-11-25 DIAGNOSIS — K219 Gastro-esophageal reflux disease without esophagitis: Secondary | ICD-10-CM | POA: Diagnosis not present

## 2017-11-25 DIAGNOSIS — Z7984 Long term (current) use of oral hypoglycemic drugs: Secondary | ICD-10-CM | POA: Insufficient documentation

## 2017-11-25 DIAGNOSIS — B9789 Other viral agents as the cause of diseases classified elsewhere: Secondary | ICD-10-CM | POA: Diagnosis not present

## 2017-11-25 DIAGNOSIS — D509 Iron deficiency anemia, unspecified: Secondary | ICD-10-CM | POA: Diagnosis not present

## 2017-11-25 DIAGNOSIS — R7303 Prediabetes: Secondary | ICD-10-CM | POA: Diagnosis not present

## 2017-11-25 DIAGNOSIS — F329 Major depressive disorder, single episode, unspecified: Secondary | ICD-10-CM | POA: Diagnosis not present

## 2017-11-25 DIAGNOSIS — Z7982 Long term (current) use of aspirin: Secondary | ICD-10-CM | POA: Insufficient documentation

## 2017-11-25 DIAGNOSIS — G4733 Obstructive sleep apnea (adult) (pediatric): Secondary | ICD-10-CM | POA: Diagnosis not present

## 2017-11-25 DIAGNOSIS — Z87891 Personal history of nicotine dependence: Secondary | ICD-10-CM | POA: Insufficient documentation

## 2017-11-25 DIAGNOSIS — M797 Fibromyalgia: Secondary | ICD-10-CM | POA: Insufficient documentation

## 2017-11-25 DIAGNOSIS — Z79899 Other long term (current) drug therapy: Secondary | ICD-10-CM | POA: Diagnosis not present

## 2017-11-25 DIAGNOSIS — I10 Essential (primary) hypertension: Secondary | ICD-10-CM | POA: Insufficient documentation

## 2017-11-25 DIAGNOSIS — J209 Acute bronchitis, unspecified: Secondary | ICD-10-CM | POA: Insufficient documentation

## 2017-11-25 DIAGNOSIS — E739 Lactose intolerance, unspecified: Secondary | ICD-10-CM | POA: Diagnosis not present

## 2017-11-25 DIAGNOSIS — H6690 Otitis media, unspecified, unspecified ear: Secondary | ICD-10-CM | POA: Diagnosis not present

## 2017-11-25 MED ORDER — AMOXICILLIN 500 MG PO CAPS: 500.0000 mg | ORAL_CAPSULE | Freq: Three times a day (TID) | ORAL | 0 refills | Status: DC

## 2017-11-25 MED ORDER — ALBUTEROL SULFATE HFA 108 (90 BASE) MCG/ACT IN AERS: 2.0000 | INHALATION_SPRAY | Freq: Four times a day (QID) | RESPIRATORY_TRACT | 2 refills | Status: DC | PRN

## 2017-11-25 MED FILL — PROVENTIL HFA 108 (90 BASE): 108 (90 BAS | 25 days supply | Qty: 7 | Fill #0

## 2017-11-25 MED FILL — LOSARTAN-HCTZ 100-25 MG TAB: 100-25 | 30 days supply | Qty: 30 | Fill #3

## 2017-11-25 MED FILL — AMOXICILLIN 500 MG CAPSULE: 500 | 10 days supply | Qty: 30 | Fill #0

## 2017-11-25 NOTE — Progress Notes (Signed)
Patient ID: Robin [name], female    DOB: [birth year]  MRN: [id number]  CC: Fever and URI   Subjective: Robin [name] is a 62 y.o. female who presents for UC visit Her concerns today include:  Patient with history of hypertension, pre-diabetes, obstructive sleep apnea, fibromyalgia, depression, obesity, GERD, hypothyroid, OA of multiple js (hands, knees, shoulders)  C/o not feeling well x 1 wk Slight temp of 99.1 Eyes, ears (LT>RT) and throat hurt + wheezing.  Mild cough of yellow/green phlegm Has Proair at home.  Makes throat sore worse.  Would like to change to different inhaler if posible Patient Active Problem List   Diagnosis Date Noted  . Acute bronchitis 10/19/2017  . Immunization due 07/30/2017  . 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 Early satiety 06/16/2017  . Nausea without vomiting 06/16/2017  . Hx of iron deficiency anemia 05/28/2017  . Prediabetes 02/04/2017  . Chronic allergic rhinitis 12/23/2016  . Hemoptysis 12/23/2016  . Fibromyalgia 08/18/2016  . Chronic lymphocytic thyroiditis 08/18/2016  . Depression 04/01/2016  . OSA (obstructive sleep apnea) 04/01/2016  . HTN (hypertension), benign 12/09/2015  . Hypothyroidism 12/09/2015  . Morbid obesity ([place]) 12/09/2015  . Constipation 05/27/2015  . Fatty liver 05/27/2015  . Abnormal small bowel biopsy 09/18/2011  . Lactose intolerance 09/18/2011  . GERD 01/21/2010     Current Outpatient Medications on File Prior to Visit  Medication Sig Dispense Refill  . acetaminophen (TYLENOL) 500 MG tablet Take 1 tablet (500 mg total) by mouth every 6 (six) hours as needed for headache. (Patient taking differently: Take 1,000 mg by mouth every 6 (six) hours as needed for headache. ) 30 tablet 0  . amitriptyline (ELAVIL) 50 MG tablet Take 1 tablet (50 mg total) by mouth at bedtime. 60 tablet 3  . amLODipine (NORVASC) 10 MG tablet Take 1 tablet (10 mg total) by mouth daily. 90 tablet 3  . aspirin 81 MG tablet Take 1 tablet (81 mg  total) by mouth at bedtime. Reported on 12/09/2015 90 tablet 3  . benzonatate (TESSALON) 100 MG capsule TAKE 1 CAPSULE BY MOUTH 3 TIMES DAILY AS NEEDED FOR COUGH. 30 capsule 3  . buPROPion (WELLBUTRIN XL) 300 MG 24 hr tablet Take 1 tablet (300 mg total) by mouth every morning. Reported on 12/09/2015 90 tablet 3  . diclofenac sodium (VOLTAREN) 1 % GEL Apply 2 g topically 4 (four) times daily. 100 g 2  . fexofenadine (ALLEGRA) 180 MG tablet Take 1 tablet (180 mg total) by mouth daily. Reported on 12/09/2015 30 tablet 3  . fluticasone (FLONASE) 50 MCG/ACT nasal spray Place 2 sprays into both nostrils daily. 16 g 6  . gabapentin (NEURONTIN) 300 MG capsule Take 2 capsules (600 mg total) by mouth 3 (three) times daily. 180 capsule 5  . Glucose Blood (BLOOD GLUCOSE TEST STRIPS) STRP Use as directed. One Touch. 100 each 1  . hydrALAZINE (APRESOLINE) 25 MG tablet Take 1 tablet (25 mg total) by mouth 3 (three) times daily. 90 tablet 3  . Hydrocodone-Acetaminophen (NORCO PO) Take by mouth.    Marland Kitchen ipratropium (ATROVENT) 0.06 % nasal spray Place 2 sprays into both nostrils 4 (four) times daily. (Patient taking differently: Place 1 spray into both nostrils 4 (four) times daily. ) 15 mL 1  . levothyroxine (SYNTHROID, LEVOTHROID) 88 MCG tablet TAKE 1 TABLET BY MOUTH DAILY. 90 tablet 0  . LINZESS 290 MCG CAPS capsule TAKE 1 CAPSULE BY MOUTH DAILY BEFORE BREAKFAST. 30 capsule 5  . loratadine (CLARITIN)  10 MG tablet Take 1 tablet (10 mg total) by mouth daily. (Patient taking differently: Take 10 mg by mouth daily as needed for allergies. ) 30 tablet 11  . losartan-hydrochlorothiazide (HYZAAR) 100-25 MG tablet Take 1 tablet daily by mouth. Reported on 12/09/2015 90 tablet 2  . MELOXICAM PO Take by mouth.    . metFORMIN (GLUCOPHAGE) 500 MG tablet TAKE 1/2 TABLET DAILY WITH BREAKFAST BY MOUTH. 45 tablet 0  . methocarbamol (ROBAXIN) 500 MG tablet Take 1 tablet (500 mg total) by mouth every 8 (eight) hours as needed for muscle  spasms. (Patient taking differently: Take 500 mg by mouth at bedtime as needed for muscle spasms. ) 60 tablet 1  . montelukast (SINGULAIR) 10 MG tablet Take 1 tablet (10 mg total) by mouth at bedtime. 30 tablet 11  . Multiple Vitamin (MULTIVITAMIN) tablet Take 1 tablet by mouth daily. Reported on 12/09/2015 90 tablet 1  . Omeprazole 20 MG TBEC Take 1 tablet (20 mg total) by mouth 2 (two) times daily before a meal. 60 tablet 5  . traMADol (ULTRAM) 50 MG tablet Take 1 tablet (50 mg total) by mouth 2 (two) times daily as needed. 60 tablet 2   No current facility-administered medications on file prior to visit.     Allergies  Allergen Reactions  . Celexa [Citalopram Hydrobromide] Other (See Comments)    "Made me feel out of it"    Social History   Socioeconomic History  . Marital status: Married    Spouse name: Not on file  . Number of children: 4  . Years of education: 10  . Highest education level: Not on file  Social Needs  . Financial resource strain: Not on file  . Food insecurity - worry: Not on file  . Food insecurity - inability: Not on file  . Transportation needs - medical: Not on file  . Transportation needs - non-medical: Not on file  Occupational History    Employer: UNEMPLOYED  Tobacco Use  . Smoking status: Former Smoker    Packs/day: 0.50    Years: 10.00    Pack years: 5.00    Types: Cigarettes    Start date: 07/14/1975    Last attempt to quit: 04/04/1993    Years since quitting: 24.6  . Smokeless tobacco: Never Used  . Tobacco comment: 17 yrs ago  Substance and Sexual Activity  . Alcohol use: No    Alcohol/week: 0.0 oz  . Drug use: No  . Sexual activity: Not on file  Other Topics Concern  . Not on file  Social History Narrative   Lives with husband in an apartment on the first floor.  Has 4 children.     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  Trying to get disability.  Formerly worked as a Teacher, early years/pre.  Education: 11th grade.       [facility] (12/23/16):   Originally from [facility]. She was raised in [place]. 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 No pets currently. No bird exposure. She does have mold in her current home in the bathroom, laundry room, & master bedroom.     Family History  Problem Relation Age of Onset  . Kidney cancer Father   . Hypertension Father   . Hyperlipidemia Father   . Sleep apnea Father   .  Diabetes Mother   . Hypertension Mother   . Heart Problems Mother   . Hyperlipidemia Mother   . Asthma Mother   . Sleep apnea Mother   . Liver disease Brother   . Arthritis Brother   . Hypertension Sister   . Allergic rhinitis Sister   . Stomach cancer Unknown        aunt  . Breast cancer Maternal Grandmother   . Kidney disease Maternal Grandfather   . Breast cancer Paternal Grandmother   . Hypertension Brother   . Hyperlipidemia Brother   . Hypertension Brother   . Hypertension Sister   . Hyperlipidemia Sister   . Lupus Maternal Aunt   . Colon cancer Neg Hx   . Eczema Neg Hx   . Immunodeficiency Neg Hx   . Urticaria Neg Hx     Past Surgical History:  Procedure Laterality Date  . ABDOMINAL HYSTERECTOMY    . ABDOMINAL HYSTERECTOMY  02/18/2000  . CARDIAC CATHETERIZATION  08/17/2001   normal L main/LAD/L Cfx/RCA (Dr. Adora Fridge)  . COLONOSCOPY  2006   Dr. [name], hyperplastic polyps  . COLONOSCOPY  08/06/11   abnormal terminal ileum for 10cm, erosions, geographical ulceration. Bx small bowel mucosa with prominent intramucosal lymphoid aggregates, slightly inflammed  . DIRECT LARYNGOSCOPY  03/15/2012   Procedure: DIRECT LARYNGOSCOPY;  Surgeon: [name], MD;  Location: [facility];  Service: ENT;  Laterality: N/A; Dr. Wolicki--:>no foreign body seen. normal esophagus to 40cm  . ESOPHAGEAL DILATION  04/17/2015   Procedure: ESOPHAGEAL DILATION;   Surgeon: [name], MD;  [facility];  Service: Endoscopy;;  . ESOPHAGOGASTRODUODENOSCOPY  08/23/2008   WUJ:WJXB distal esophageal erosions consistent with mild erosive reflux esophagitis, otherwise unremarkable esophagus/ Tiny antral erosions of doubtful clinical significance, otherwise normal stomach, patent pylorus, normal D1 and D2  . ESOPHAGOGASTRODUODENOSCOPY  08/06/11   small hh, noncritical Schatzki's ring s/p 14 F  . ESOPHAGOGASTRODUODENOSCOPY N/A 04/17/2015   JYN:[id number] s/p dilation  . ESOPHAGOSCOPY  03/15/2012   Procedure: ESOPHAGOSCOPY;  Surgeon: [name], MD;  Location: [place];  Service: ENT;  Laterality: N/A;  . KNEE ARTHROSCOPY  04/27/2001  . NM MYOCAR PERF WALL MOTION  2003   negative bruce protocol exercise tress test; EF 68%; intermediate risk study due to evidence of anterior wall ischemia extending from mid-ventricle to apex  . PITUITARY SURGERY  06/2012   benign tumor, surgeon at [facility]  . RECTOCELE REPAIR    . TRANSTHORACIC ECHOCARDIOGRAM  2003   EF normal  . VAGINAL PROLAPSE REPAIR    . VIDEO BRONCHOSCOPY Bilateral 03/08/2017   Procedure: VIDEO BRONCHOSCOPY WITHOUT FLUORO;  Surgeon: Javier Glazier, MD;  Location: [place];  Service: Cardiopulmonary;  Laterality: Bilateral;    ROS: Review of Systems Negative except as stated above PHYSICAL EXAM: BP 135/76   Pulse 77   Temp 98.2 F (36.8 C) (Oral)   Resp 16   Ht 5\' 8"  (1.727 m)   Wt (!) 322 lb 12.8 oz (146.4 kg)   SpO2 98%   BMI 49.08 kg/m   Physical Exam General appearance - alert, well appearing, and in no distress Mental status - alert, oriented to person, place, and time, normal mood, behavior, speech, dress, motor activity, and thought processes Ears -right: Canal and membrane within normal limits.  Left: Canal normal.  Dull light reflex with mild erythema of TM Mouth - mucous membranes moist, pharynx normal without lesions  Nose -mucosa dry.  Nasal turbinates within normal  limits. Neck - supple, no significant adenopathy Chest - clear to auscultation, no wheezes, rales or rhonchi, symmetric air entry Heart - normal rate, regular rhythm, normal S1, S2, no murmurs, rubs, clicks or gallops  ASSESSMENT AND PLAN: 1. Viral upper respiratory tract infection -Conservative measures recommended. I have change ProAir to Proventil  2. Acute otitis media, unspecified otitis media type - amoxicillin (AMOXIL) 500 MG capsule; Take 1 capsule (500 mg total) by mouth 3 (three) times daily.  Dispense: 30 capsule; Refill: 0  Patient was given the opportunity to ask questions.  Patient verbalized understanding of the plan and was able to repeat key elements of the plan.   No orders of the defined types were placed in this encounter.    Requested Prescriptions   Signed Prescriptions Disp Refills  . albuterol (PROVENTIL HFA;VENTOLIN HFA) 108 (90 Base) MCG/ACT inhaler 1 Inhaler 2    Sig: Inhale 2 puffs into the lungs every 6 (six) hours as needed for wheezing or shortness of breath.  Marland Kitchen amoxicillin (AMOXIL) 500 MG capsule 30 capsule 0    Sig: Take 1 capsule (500 mg total) by mouth 3 (three) times daily.    Return in about 6 weeks (around 01/06/2018) for chronic ds management.  Karle Plumber, MD, FACP

## 2017-11-25 NOTE — Patient Instructions (Signed)
Upper Respiratory Infection, Adult Most upper respiratory infections (URIs) are caused by a virus. A URI affects the nose, throat, and upper air passages. The most common type of URI is often called "the common cold." Follow these instructions at home:  Take medicines only as told by your doctor.  Gargle warm saltwater or take cough drops to comfort your throat as told by your doctor.  Use a warm mist humidifier or inhale steam from a shower to increase air moisture. This may make it easier to breathe.  Drink enough fluid to keep your pee (urine) clear or pale yellow.  Eat soups and other clear broths.  Have a healthy diet.  Rest as needed.  Go back to work when your fever is gone or your doctor says it is okay. ? You may need to stay home longer to avoid giving your URI to others. ? You can also wear a face mask and wash your hands often to prevent spread of the virus.  Use your inhaler more if you have asthma.  Do not use any tobacco products, including cigarettes, chewing tobacco, or electronic cigarettes. If you need help quitting, ask your doctor. Contact a doctor if:  You are getting worse, not better.  Your symptoms are not helped by medicine.  You have chills.  You are getting more short of breath.  You have brown or red mucus.  You have yellow or brown discharge from your nose.  You have pain in your face, especially when you bend forward.  You have a fever.  You have puffy (swollen) neck glands.  You have pain while swallowing.  You have white areas in the back of your throat. Get help right away if:  You have very bad or constant: ? Headache. ? Ear pain. ? Pain in your forehead, behind your eyes, and over your cheekbones (sinus pain). ? Chest pain.  You have long-lasting (chronic) lung disease and any of the following: ? Wheezing. ? Long-lasting cough. ? Coughing up blood. ? A change in your usual mucus.  You have a stiff neck.  You have  changes in your: ? Vision. ? Hearing. ? Thinking. ? Mood. This information is not intended to replace advice given to you by your health care provider. Make sure you discuss any questions you have with your health care provider. Document Released: 03/09/2008 Document Revised: 05/24/2016 Document Reviewed: 12/27/2013 Elsevier Interactive Patient Education  2018 Elsevier Inc.  

## 2017-11-25 NOTE — Progress Notes (Signed)
Pt is requesting a new inhaler because she doesn't like the one she has because it makes her throat sore

## 2017-11-30 ENCOUNTER — Ambulatory Visit: Payer: Medicaid Other | Admitting: Physical Therapy

## 2017-11-30 ENCOUNTER — Encounter: Payer: Self-pay | Admitting: Physical Therapy

## 2017-11-30 DIAGNOSIS — M25552 Pain in left hip: Secondary | ICD-10-CM

## 2017-11-30 DIAGNOSIS — G8929 Other chronic pain: Secondary | ICD-10-CM

## 2017-11-30 DIAGNOSIS — R262 Difficulty in walking, not elsewhere classified: Secondary | ICD-10-CM

## 2017-11-30 DIAGNOSIS — M25562 Pain in left knee: Secondary | ICD-10-CM | POA: Diagnosis not present

## 2017-11-30 DIAGNOSIS — M25561 Pain in right knee: Secondary | ICD-10-CM

## 2017-11-30 NOTE — Therapy (Signed)
[address] [place], [place], [address] Phone: [phone]   Fax:  [phone]  Physical Therapy Treatment  Patient Details  Name: Robin [name] MRN: [id number] Date of Birth: 1955-11-01 Referring Provider: Erlinda Hong   Encounter Date: 11/30/2017  PT End of Session - 11/30/17 1344    Visit Number  3    Date for PT Re-Evaluation  01/09/18    PT Start Time  5956    PT Stop Time  3875    PT Time Calculation (min)  50 min    Activity Tolerance  Patient limited by pain;Patient limited by fatigue    Behavior During Therapy  [facility] for tasks assessed/performed       Past Medical History:  Diagnosis Date  . Anxiety disorder   . Arthritis   . Asthma   . Chronic neck pain   . Constipation   . Diabetes mellitus without complication ([place])   . GERD (gastroesophageal reflux disease)   . Hiatal hernia    small  . Hyperlipidemia   . Hypertension   . Schatzki's ring    non critical  . Sleep apnea    CPAP, Sleep study at [facility]  . Sleep apnea     Past Surgical History:  Procedure Laterality Date  . ABDOMINAL HYSTERECTOMY    . ABDOMINAL HYSTERECTOMY  02/18/2000  . CARDIAC CATHETERIZATION  08/17/2001   normal L main/LAD/L Cfx/RCA (Dr. Adora Fridge)  . COLONOSCOPY  2006   Dr. [name], hyperplastic polyps  . COLONOSCOPY  08/06/11   abnormal terminal ileum for 10cm, erosions, geographical ulceration. Bx small bowel mucosa with prominent intramucosal lymphoid aggregates, slightly inflammed  . DIRECT LARYNGOSCOPY  03/15/2012   Procedure: DIRECT LARYNGOSCOPY;  Surgeon: [name], MD;  Location: [facility] [place];  Service: ENT;  Laterality: N/A; Dr. Wolicki--:>no foreign body seen. normal esophagus to 40cm  . ESOPHAGEAL DILATION  04/17/2015   Procedure: ESOPHAGEAL DILATION;  Surgeon: [name], MD;  [facility];  Service: Endoscopy;;  . ESOPHAGOGASTRODUODENOSCOPY  08/23/2008   IEP:PIRJ distal  esophageal erosions consistent with mild erosive reflux esophagitis, otherwise unremarkable esophagus/ Tiny antral erosions of doubtful clinical significance, otherwise normal stomach, patent pylorus, normal D1 and D2  . ESOPHAGOGASTRODUODENOSCOPY  08/06/11   small hh, noncritical Schatzki's ring s/p 24 F  . ESOPHAGOGASTRODUODENOSCOPY N/A 04/17/2015   [id number] s/p dilation  . ESOPHAGOSCOPY  03/15/2012   Procedure: ESOPHAGOSCOPY;  Surgeon: [name], MD;  Location: [place] [facility];  Service: ENT;  Laterality: N/A;  . KNEE ARTHROSCOPY  04/27/2001  . NM MYOCAR PERF WALL MOTION  2003   negative bruce protocol exercise tress test; EF 68%; intermediate risk study due to evidence of anterior wall ischemia extending from mid-ventricle to apex  . PITUITARY SURGERY  06/2012   benign tumor, surgeon at [facility]  . RECTOCELE REPAIR    . TRANSTHORACIC ECHOCARDIOGRAM  2003   EF normal  . VAGINAL PROLAPSE REPAIR    . VIDEO BRONCHOSCOPY Bilateral 03/08/2017   Procedure: VIDEO BRONCHOSCOPY WITHOUT FLUORO;  Surgeon: Javier Glazier, MD;  Location: [place];  Service: Cardiopulmonary;  Laterality: Bilateral;    There were no vitals filed for this visit.  Subjective Assessment - 11/30/17 1313    Subjective  Patient reports that she has some asthma and has been having some breathing issues.  She reports that she is in a lot of pain    Currently in Pain?  Yes    Pain Score  9     Pain Location  Knee shoulders, elbow    Pain Relieving Factors  unsure                      [facility] Adult PT Treatment/Exercise - 11/30/17 0001      Knee/Hip Exercises: Stretches   Gastroc Stretch  3 reps;10 seconds      Knee/Hip Exercises: Aerobic   Nustep  Level 2 x 6 minutes, 136 steps    Other Aerobic  UBE Level 2 x 4 minutes      Knee/Hip Exercises: Seated   Long Arc Quad  Both;2 sets;10 reps    [facility]  20 reps    Other Seated Knee/Hip Exercises  ankle exercises on airex in sitting, red tband HS curls,  isometric hip abduction, adduction    Other Seated Knee/Hip Exercises  seated PWR moves to get her trunk and arms moving, seated scapular stabilization exercises with red tband.  On sit fit pelvic mobility    Marching  Both;2 sets;10 reps      Moist Heat Therapy   Number Minutes Moist Heat  15 Minutes    Moist Heat Location  Knee      Electrical Stimulation   Electrical Stimulation Location  both knees    Electrical Stimulation Action  premod    Electrical Stimulation Parameters  sitting    Electrical Stimulation Goals  Pain               PT Short Term Goals - 11/30/17 1347      PT SHORT TERM GOAL #1   Title  independent with initial HEP    Status  Achieved        PT Long Term Goals - 11/11/17 1635      PT LONG TERM GOAL #1   Title  report ability to walk 150 feet without rest    Time  12    Period  Weeks    Status  New      PT LONG TERM GOAL #2   Title  report ability to stand 10 minutes to help with cooking a meal    Time  12    Period  Weeks    Status  New      PT LONG TERM GOAL #3   Title  decrease pain 25%    Time  12    Period  Weeks    Status  New      PT LONG TERM GOAL #4   Title  have knowledge and information about advanced HEP/or gym programs that are free and available to her    Time  12    Period  Weeks    Status  New            Plan - 11/30/17 1345    Clinical Impression Statement  Patient continued to c/o pain with all motions and activities, even with finger abduction/adduction.  I again explained the benefits of her moving and helping her motions and function.      PT Next Visit Plan  next visit give an overall HEP for her at home to be done in sitting, she may bring in TENS    Consulted and Agree with Plan of Care  Patient       Patient will benefit from skilled therapeutic intervention in order to improve the following deficits and impairments:  Abnormal gait, Decreased coordination, Decreased range  of motion, Difficulty  walking, Increased muscle spasms, Pain, Decreased activity tolerance, Improper body mechanics, Impaired flexibility, Postural dysfunction, Decreased strength, Decreased mobility  Visit Diagnosis: Chronic pain of left knee  Pain in left hip  Chronic pain of right knee  Difficulty in walking, not elsewhere classified     Problem List Patient Active Problem List   Diagnosis Date Noted  . Acute bronchitis 10/19/2017  . Immunization due 07/30/2017  . 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 Early satiety 06/16/2017  . Nausea without vomiting 06/16/2017  . Hx of iron deficiency anemia 05/28/2017  . Prediabetes 02/04/2017  . Chronic allergic rhinitis 12/23/2016  . Hemoptysis 12/23/2016  . Fibromyalgia 08/18/2016  . Chronic lymphocytic thyroiditis 08/18/2016  . Depression 04/01/2016  . OSA (obstructive sleep apnea) 04/01/2016  . HTN (hypertension), benign 12/09/2015  . Hypothyroidism 12/09/2015  . Morbid obesity ([place]) 12/09/2015  . Constipation 05/27/2015  . Fatty liver 05/27/2015  . Abnormal small bowel biopsy 09/18/2011  . Lactose intolerance 09/18/2011  . GERD 01/21/2010    Sumner Boast., PT 11/30/2017, 1:47 PM  [address] [place], [place], [address] Phone: [phone]   Fax:  [phone]  Name: Robin [name] MRN: [id number] Date of Birth: 04/12/1956

## 2017-12-02 ENCOUNTER — Telehealth: Payer: Self-pay | Admitting: Internal Medicine

## 2017-12-02 NOTE — Telephone Encounter (Signed)
Pt called to say that the antibiotic -amoxicillin (AMOXIL) 500 MG capsule  Has caused her a yeast infection, she denied an office visit, and would just like a phone call back with instructions on what to do, or new medications to better the infection. Please send any new medications to [facility] pharmacy Please follow up

## 2017-12-03 MED ORDER — FLUCONAZOLE 150 MG PO TABS: 150.0000 mg | ORAL_TABLET | Freq: Once | ORAL | 0 refills | Status: AC

## 2017-12-03 MED FILL — FLUCONAZOLE 150 MG TABLET: 150 | 1 days supply | Qty: 1 | Fill #0

## 2017-12-03 NOTE — Telephone Encounter (Signed)
Contacted pt and made aware of rx being sent t he pharmacy and to continue the Amoxil. Pt states she understands and doesn't have any questions or concerns

## 2017-12-07 ENCOUNTER — Ambulatory Visit: Payer: Medicaid Other | Attending: Orthopaedic Surgery | Admitting: Physical Therapy

## 2017-12-07 ENCOUNTER — Encounter: Payer: Self-pay | Admitting: Physical Therapy

## 2017-12-07 DIAGNOSIS — M25561 Pain in right knee: Secondary | ICD-10-CM | POA: Insufficient documentation

## 2017-12-07 DIAGNOSIS — R262 Difficulty in walking, not elsewhere classified: Secondary | ICD-10-CM | POA: Insufficient documentation

## 2017-12-07 DIAGNOSIS — M25562 Pain in left knee: Secondary | ICD-10-CM | POA: Insufficient documentation

## 2017-12-07 DIAGNOSIS — M25552 Pain in left hip: Secondary | ICD-10-CM | POA: Diagnosis not present

## 2017-12-07 DIAGNOSIS — G8929 Other chronic pain: Secondary | ICD-10-CM | POA: Diagnosis present

## 2017-12-07 NOTE — Therapy (Signed)
[address] [place], [place], [address] Phone: [phone]   Fax:  [phone]  Physical Therapy Treatment  Patient Details  Name: DANILYNN JEMISON MRN: [id number] Date of Birth: Oct 01, 1956 Referring Provider: Erlinda Hong   Encounter Date: 12/07/2017  PT End of Session - 12/07/17 1333    Visit Number  4    Date for PT Re-Evaluation  01/09/18    PT Start Time  1300    PT Stop Time  1342    PT Time Calculation (min)  42 min    Activity Tolerance  Patient limited by pain;Patient limited by fatigue    Behavior During Therapy  [facility] for tasks assessed/performed       Past Medical History:  Diagnosis Date  . Anxiety disorder   . Arthritis   . Asthma   . Chronic neck pain   . Constipation   . Diabetes mellitus without complication ([place])   . GERD (gastroesophageal reflux disease)   . Hiatal hernia    small  . Hyperlipidemia   . Hypertension   . Schatzki's ring    non critical  . Sleep apnea    CPAP, Sleep study at [facility]  . Sleep apnea     Past Surgical History:  Procedure Laterality Date  . ABDOMINAL HYSTERECTOMY    . ABDOMINAL HYSTERECTOMY  02/18/2000  . CARDIAC CATHETERIZATION  08/17/2001   normal L main/LAD/L Cfx/RCA (Dr. Adora Fridge)  . COLONOSCOPY  2006   Dr. [name], hyperplastic polyps  . COLONOSCOPY  08/06/11   abnormal terminal ileum for 10cm, erosions, geographical ulceration. Bx small bowel mucosa with prominent intramucosal lymphoid aggregates, slightly inflammed  . DIRECT LARYNGOSCOPY  03/15/2012   Procedure: DIRECT LARYNGOSCOPY;  Surgeon: [name], MD;  Location: [place];  Service: ENT;  Laterality: N/A; Dr. Wolicki--:>no foreign body seen. normal esophagus to 40cm  . ESOPHAGEAL DILATION  04/17/2015   Procedure: ESOPHAGEAL DILATION;  Surgeon: [name], MD;  [facility];  Service: Endoscopy;;  . ESOPHAGOGASTRODUODENOSCOPY  08/23/2008   AJG:OTLX distal  esophageal erosions consistent with mild erosive reflux esophagitis, otherwise unremarkable esophagus/ Tiny antral erosions of doubtful clinical significance, otherwise normal stomach, patent pylorus, normal D1 and D2  . ESOPHAGOGASTRODUODENOSCOPY  08/06/11   small hh, noncritical Schatzki's ring s/p 41 F  . ESOPHAGOGASTRODUODENOSCOPY N/A 04/17/2015   BWI:OMBTDH s/p dilation  . ESOPHAGOSCOPY  03/15/2012   Procedure: ESOPHAGOSCOPY;  Surgeon: [name], MD;  Location: [place];  Service: ENT;  Laterality: N/A;  . KNEE ARTHROSCOPY  04/27/2001  . NM MYOCAR PERF WALL MOTION  2003   negative bruce protocol exercise tress test; EF 68%; intermediate risk study due to evidence of anterior wall ischemia extending from mid-ventricle to apex  . 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 RECTOCELE REPAIR    . TRANSTHORACIC ECHOCARDIOGRAM  2003   EF normal  . VAGINAL PROLAPSE REPAIR    . VIDEO BRONCHOSCOPY Bilateral 03/08/2017   Procedure: VIDEO BRONCHOSCOPY WITHOUT FLUORO;  Surgeon: Javier Glazier, MD;  Location: [place];  Service: Cardiopulmonary;  Laterality: Bilateral;    There were no vitals filed for this visit.  Subjective Assessment - 12/07/17 1300    Subjective  Pt reports that she was up early this morning running errands with her husband now she just aches all over     Currently in Pain?  Yes  Pain Score  9     Pain Location  -- knee, back, shoulders "on and on"                      [facility] Adult PT Treatment/Exercise - 12/07/17 0001      Knee/Hip Exercises: Aerobic   Nustep  Level 2 x 6 minutes, 146 steps    Other Aerobic  UBE Level 2 x 4 minutes      Knee/Hip Exercises: Seated   Long Arc Quad  Both;2 sets;10 reps    Heel Slides  Right    Ball Squeeze  20 reps    Marching  Both;2 sets;10 reps    Hamstring Curl  2 sets;10 reps;Both    Hamstring Limitations  red tbabd       Modalities   Modalities  Moist Heat      Moist Heat Therapy    Number Minutes Moist Heat  10 Minutes    Moist Heat Location  Knee               PT Short Term Goals - 11/30/17 1347      PT SHORT TERM GOAL #1   Title  independent with initial HEP    Status  Achieved        PT Long Term Goals - 11/11/17 1635      PT LONG TERM GOAL #1   Title  report ability to walk 150 feet without rest    Time  12    Period  Weeks    Status  New      PT LONG TERM GOAL #2   Title  report ability to stand 10 minutes to help with cooking a meal    Time  12    Period  Weeks    Status  New      PT LONG TERM GOAL #3   Title  decrease pain 25%    Time  12    Period  Weeks    Status  New      PT LONG TERM GOAL #4   Title  have knowledge and information about advanced HEP/or gym programs that are free and available to her    Time  12    Period  Weeks    Status  New            Plan - 12/07/17 1333    Clinical Impression Statement  Pt continues to report pain with all activities. Increase hip pain with marches, knee pain with LAQ.      Rehab Potential  Good    PT Frequency  2x / week    PT Treatment/Interventions  ADLs/Self Care Home Management;Cryotherapy;Electrical Stimulation;Iontophoresis 75m/ml Dexamethasone;Moist Heat;Gait training;Therapeutic exercise;Therapeutic activities;Functional mobility training;Stair training;Patient/family education;Manual techniques    PT Next Visit Plan  D/C pt  lack of progress       Patient will benefit from skilled therapeutic intervention in order to improve the following deficits and impairments:  Abnormal gait, Decreased coordination, Decreased range of motion, Difficulty walking, Increased muscle spasms, Pain, Decreased activity tolerance, Improper body mechanics, Impaired flexibility, Postural dysfunction, Decreased strength, Decreased mobility  Visit Diagnosis: Pain in left hip  Chronic pain of left knee  Chronic pain of right knee  Difficulty in walking, not elsewhere  classified     Problem List Patient Active Problem List   Diagnosis Date Noted  . Acute bronchitis 10/19/2017  . Immunization due 07/30/2017  . 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  Early satiety 06/16/2017  . Nausea without vomiting 06/16/2017  . Hx of iron deficiency anemia 05/28/2017  . Prediabetes 02/04/2017  . Chronic allergic rhinitis 12/23/2016  . Hemoptysis 12/23/2016  . Fibromyalgia 08/18/2016  . Chronic lymphocytic thyroiditis 08/18/2016  . Depression 04/01/2016  . OSA (obstructive sleep apnea) 04/01/2016  . HTN (hypertension), benign 12/09/2015  . Hypothyroidism 12/09/2015  . Morbid obesity ([place]) 12/09/2015  . Constipation 05/27/2015  . Fatty liver 05/27/2015  . Abnormal small bowel biopsy 09/18/2011  . Lactose intolerance 09/18/2011  . GERD 01/21/2010    PHYSICAL THERAPY DISCHARGE SUMMARY  Visits from Start of Care: 4  Plan: Patient agrees to discharge.  Patient goals were not met. Patient is being discharged due to lack of progress.  ?????       Scot Jun, PTA 12/07/2017, 1:36 PM  [address] [place], [place], [address] Phone: [phone]   Fax:  [phone]  Name: CLEDA IMEL MRN: [id number] Date of Birth: 10/29/1955

## 2017-12-10 MED FILL — metFORMIN HCL 500 MG TABS: 500 | 30 days supply | Qty: 15 | Fill #1

## 2017-12-10 MED FILL — AMITRIPTYLINE HCL 50 MG TAB: 50 | 30 days supply | Qty: 30 | Fill #3

## 2017-12-10 MED FILL — LINZESS 290 MCG CAPSULE: 290 | 30 days supply | Qty: 30 | Fill #2

## 2017-12-13 MED FILL — MONTELUKAST SOD 10 MG TAB: 10 | 30 days supply | Qty: 30 | Fill #5

## 2017-12-13 MED FILL — BUPROPION HCL XL 300 MG TAB: 300 | 30 days supply | Qty: 30 | Fill #2

## 2017-12-13 MED FILL — LEVOTHYROXINE 88 MCG TABLET: 88 | 30 days supply | Qty: 30 | Fill #1

## 2017-12-14 ENCOUNTER — Ambulatory Visit: Payer: Medicaid Other | Admitting: Gastroenterology

## 2017-12-14 ENCOUNTER — Encounter: Payer: Self-pay | Admitting: Gastroenterology

## 2017-12-14 ENCOUNTER — Telehealth: Payer: Self-pay

## 2017-12-14 ENCOUNTER — Other Ambulatory Visit: Payer: Self-pay

## 2017-12-14 VITALS — BP 141/75 | HR 77 | Temp 97.0°F | Ht 68.0 in | Wt 326.4 lb

## 2017-12-14 DIAGNOSIS — R6881 Early satiety: Secondary | ICD-10-CM | POA: Diagnosis not present

## 2017-12-14 DIAGNOSIS — K760 Fatty (change of) liver, not elsewhere classified: Secondary | ICD-10-CM | POA: Diagnosis not present

## 2017-12-14 DIAGNOSIS — K5900 Constipation, unspecified: Secondary | ICD-10-CM | POA: Diagnosis not present

## 2017-12-14 DIAGNOSIS — K219 Gastro-esophageal reflux disease without esophagitis: Secondary | ICD-10-CM | POA: Diagnosis not present

## 2017-12-14 DIAGNOSIS — R110 Nausea: Secondary | ICD-10-CM | POA: Diagnosis not present

## 2017-12-14 DIAGNOSIS — R1310 Dysphagia, unspecified: Secondary | ICD-10-CM

## 2017-12-14 MED ORDER — PANTOPRAZOLE SODIUM 40 MG PO TBEC: 40.0000 mg | DELAYED_RELEASE_TABLET | Freq: Two times a day (BID) | ORAL | 5 refills | Status: DC

## 2017-12-14 MED FILL — PANTOPRAZOLE SOD DR 40 MG T: 40 | 30 days supply | Qty: 60 | Fill #0

## 2017-12-14 NOTE — Progress Notes (Signed)
CC'ED TO PCP 

## 2017-12-14 NOTE — Telephone Encounter (Signed)
Called and informed pt of pre-op appt 01/13/18 at 10:00am. Letter mailed.

## 2017-12-14 NOTE — Assessment & Plan Note (Signed)
Doing well on Linzess. 

## 2017-12-14 NOTE — Patient Instructions (Signed)
1. Stop omeprazole.  2. Start pantoprazole twice daily 30 minutes before breakfast and evening meal. I have sent RX to your pharmacy. Call your pharmacy in the next day or so to see if it is ready and covered by Medicaid. 3. Upper endoscopy as scheduled. See separate instructions.

## 2017-12-14 NOTE — Assessment & Plan Note (Signed)
Breakthrough heartburn on omeprazole BID. Complains of esophageal dysphagia to solid foods/pills, early satiety, nausea, upper abd pain. DDx includes gastritis, PUD, complicated GERD, dyspepsia. Less likely malignancy. Gallbladder appeared unremarkable on prior imaging one year ago but not completely excluded at this time.   Offered EGD/ED with Dr. [name] with deep sedation.  I have discussed the risks, alternatives, benefits with regards to but not limited to the risk of reaction to medication, bleeding, infection, perforation and the patient is agreeable to proceed. Written consent to be obtained.  Switch omeprazole to pantoprazole 40mg  BID.   Based on EGD findings, will determine further necessary work up.

## 2017-12-14 NOTE — Assessment & Plan Note (Signed)
Stressed importance of weight loss, being as active as possible. LFTs have been normal. Would advise continue to recheck LFTs twice yearly.

## 2017-12-14 NOTE — Progress Notes (Signed)
Primary Care Physician: Ladell Pier, MD  Primary Gastroenterologist:  Garfield Cornea, MD   Chief Complaint  Patient presents with  . Nausea  . Bloated    HPI: Robin [name] is a 62 y.o. female here follow-up. She is last seen in September. She has a history of reflux/nausea, constipation, upper abdominal discomfort. She had a normal gastric emptying study back in September. Dr. Erik Obey repeated an esophagram last month for dysphagia. She had trace reflux elicited with the water siphon test. Small reducible hiatal hernia in the prone position. Esophagus otherwise unremarkable.  She has gained over 20 pounds in the past one year. Last EGD/ED 3 years ago seemed to help her dysphgia. She reports reflux poorly controled on Omeprazole BID. Previously was on ranitidine, Dexilant. Can't remember if helpful. Complains of ongoing early satiety, bloating, heartburn. Upper abdominal pain, intermittent, with and without meals. Nausea but no vomiting. BM regular on Linzess. No melena, brbpr. No abdominal pain.   CT A/P with contrast ONE YEAR AGO mildly prominent mesenteric lymph nodes, largest a right common ileo collar chain node measuring 1.1 cm in short axis. Presumed reactive.    Current Outpatient Medications  Medication Sig Dispense Refill  . acetaminophen (TYLENOL) 500 MG tablet Take 1 tablet (500 mg total) by mouth every 6 (six) hours as needed for headache. (Patient taking differently: Take 1,000 mg by mouth every 6 (six) hours as needed for headache. ) 30 tablet 0  . albuterol (PROVENTIL HFA;VENTOLIN HFA) 108 (90 Base) MCG/ACT inhaler Inhale 2 puffs into the lungs every 6 (six) hours as needed for wheezing or shortness of breath. 1 Inhaler 2  . amitriptyline (ELAVIL) 50 MG tablet Take 1 tablet (50 mg total) by mouth at bedtime. 60 tablet 3  . amLODipine (NORVASC) 10 MG tablet Take 1 tablet (10 mg total) by mouth daily. 90 tablet 3  . aspirin 81 MG tablet Take 1 tablet (81 mg  total) by mouth at bedtime. Reported on 12/09/2015 90 tablet 3  . benzonatate (TESSALON) 100 MG capsule TAKE 1 CAPSULE BY MOUTH 3 TIMES DAILY AS NEEDED FOR COUGH. 30 capsule 3  . buPROPion (WELLBUTRIN XL) 300 MG 24 hr tablet Take 1 tablet (300 mg total) by mouth every morning. Reported on 12/09/2015 90 tablet 3  . diclofenac sodium (VOLTAREN) 1 % GEL Apply 2 g topically 4 (four) times daily. 100 g 2  . fexofenadine (ALLEGRA) 180 MG tablet Take 1 tablet (180 mg total) by mouth daily. Reported on 12/09/2015 30 tablet 3  . fluticasone (FLONASE) 50 MCG/ACT nasal spray Place 2 sprays into both nostrils daily. 16 g 6  . gabapentin (NEURONTIN) 300 MG capsule Take 2 capsules (600 mg total) by mouth 3 (three) times daily. 180 capsule 5  . Glucose Blood (BLOOD GLUCOSE TEST STRIPS) STRP Use as directed. One Touch. 100 each 1  . hydrALAZINE (APRESOLINE) 25 MG tablet Take 1 tablet (25 mg total) by mouth 3 (three) times daily. 90 tablet 3  . Hydrocodone-Acetaminophen (NORCO PO) Take by mouth as needed.     Marland Kitchen ipratropium (ATROVENT) 0.06 % nasal spray Place 2 sprays into both nostrils 4 (four) times daily. (Patient taking differently: Place 1 spray into both nostrils as needed. ) 15 mL 1  . levothyroxine (SYNTHROID, LEVOTHROID) 88 MCG tablet TAKE 1 TABLET BY MOUTH DAILY. 90 tablet 0  . LINZESS 290 MCG CAPS capsule TAKE 1 CAPSULE BY MOUTH DAILY BEFORE BREAKFAST. 30 capsule 5  . loratadine (  CLARITIN) 10 MG tablet Take 1 tablet (10 mg total) by mouth daily. (Patient taking differently: Take 10 mg by mouth daily as needed for allergies. ) 30 tablet 11  . losartan-hydrochlorothiazide (HYZAAR) 100-25 MG tablet Take 1 tablet daily by mouth. Reported on 12/09/2015 90 tablet 2  . MELOXICAM PO Take by mouth as needed.     . metFORMIN (GLUCOPHAGE) 500 MG tablet TAKE 1/2 TABLET DAILY WITH BREAKFAST BY MOUTH. 45 tablet 0  . methocarbamol (ROBAXIN) 500 MG tablet Take 1 tablet (500 mg total) by mouth every 8 (eight) hours as needed for  muscle spasms. (Patient taking differently: Take 500 mg by mouth at bedtime as needed for muscle spasms. ) 60 tablet 1  . montelukast (SINGULAIR) 10 MG tablet Take 1 tablet (10 mg total) by mouth at bedtime. 30 tablet 11  . Multiple Vitamin (MULTIVITAMIN) tablet Take 1 tablet by mouth daily. Reported on 12/09/2015 90 tablet 1  . Omeprazole 20 MG TBEC Take 1 tablet (20 mg total) by mouth 2 (two) times daily before a meal. 60 tablet 5   No current facility-administered medications for this visit.     Allergies as of 12/14/2017 - Review Complete 12/14/2017  Allergen Reaction Noted  . Celexa [citalopram hydrobromide] Other (See Comments) 05/28/2017   Past Medical History:  Diagnosis Date  . Anxiety disorder   . Arthritis   . Asthma   . Chronic neck pain   . Constipation   . Diabetes mellitus without complication ([place])   . GERD (gastroesophageal reflux disease)   . Hiatal hernia    small  . Hyperlipidemia   . Hypertension   . Schatzki's ring    non critical  . Sleep apnea    CPAP, Sleep study at [facility]  . Sleep apnea    Past Surgical History:  Procedure Laterality Date  . ABDOMINAL HYSTERECTOMY    . ABDOMINAL HYSTERECTOMY  02/18/2000  . CARDIAC CATHETERIZATION  08/17/2001   normal L main/LAD/L Cfx/RCA (Dr. Adora Fridge)  . COLONOSCOPY  2006   Dr. [name], hyperplastic polyps  . COLONOSCOPY  08/06/11   abnormal terminal ileum for 10cm, erosions, geographical ulceration. Bx small bowel mucosa with prominent intramucosal lymphoid aggregates, slightly inflammed  . DIRECT LARYNGOSCOPY  03/15/2012   Procedure: DIRECT LARYNGOSCOPY;  Surgeon: [name], MD;  Location: [place];  Service: ENT;  Laterality: N/A; Dr. Wolicki--:>no foreign body seen. normal esophagus to 40cm  . ESOPHAGEAL DILATION  04/17/2015   Procedure: ESOPHAGEAL DILATION;  Surgeon: [name], MD;  [facility];  Service: Endoscopy;;  . ESOPHAGOGASTRODUODENOSCOPY  08/23/2008   NAT:FTDD  distal esophageal erosions consistent with mild erosive reflux esophagitis, otherwise unremarkable esophagus/ Tiny antral erosions of doubtful clinical significance, otherwise normal stomach, patent pylorus, normal D1 and D2  . ESOPHAGOGASTRODUODENOSCOPY  08/06/11   small hh, noncritical Schatzki's ring s/p 48 F  . ESOPHAGOGASTRODUODENOSCOPY N/A 04/17/2015   UKG:URKYHC s/p dilation  . ESOPHAGOSCOPY  03/15/2012   Procedure: ESOPHAGOSCOPY;  Surgeon: [name], MD;  Location: [place];  Service: ENT;  Laterality: N/A;  . KNEE ARTHROSCOPY  04/27/2001  . NM MYOCAR PERF WALL MOTION  2003   negative bruce protocol exercise tress test; EF 68%; intermediate risk study due to evidence of anterior wall ischemia extending from mid-ventricle to apex  . PITUITARY SURGERY  06/2012   benign tumor, surgeon at [facility]  . RECTOCELE REPAIR    . TRANSTHORACIC ECHOCARDIOGRAM  2003   EF normal  .  VAGINAL PROLAPSE REPAIR    . VIDEO BRONCHOSCOPY Bilateral 03/08/2017   Procedure: VIDEO BRONCHOSCOPY WITHOUT FLUORO;  Surgeon: Javier Glazier, MD;  Location: [place];  Service: Cardiopulmonary;  Laterality: Bilateral;   Family History  Problem Relation Age of Onset  . Kidney cancer Father   . Hypertension Father   . Hyperlipidemia Father   . Sleep apnea Father   . Diabetes Mother   . Hypertension Mother   . Heart Problems Mother   . Hyperlipidemia Mother   . Asthma Mother   . Sleep apnea Mother   . Liver disease Brother   . Arthritis Brother   . Hypertension Sister   . Allergic rhinitis Sister   . Stomach cancer Unknown        aunt  . Breast cancer Maternal Grandmother   . Kidney disease Maternal Grandfather   . Breast cancer Paternal Grandmother   . Hypertension Brother   . Hyperlipidemia Brother   . Hypertension Brother   . Hypertension Sister   . Hyperlipidemia Sister   . Lupus Maternal Aunt   . Colon cancer Neg Hx   . Eczema Neg Hx   . Immunodeficiency Neg Hx   . Urticaria Neg Hx    Social  History   Tobacco Use  . Smoking status: Former Smoker    Packs/day: 0.50    Years: 10.00    Pack years: 5.00    Types: Cigarettes    Start date: 07/14/1975    Last attempt to quit: 04/04/1993    Years since quitting: 24.7  . Smokeless tobacco: Never Used  . Tobacco comment: 17 yrs ago  Substance Use Topics  . Alcohol use: No    Alcohol/week: 0.0 oz  . Drug use: No     ROS:  General: Negative for anorexia, weight loss, fever, chills, fatigue, weakness. ENT: Negative for hoarseness  nasal congestion. See hpi CV: Negative for chest pain, angina, palpitations, dyspnea on exertion, peripheral edema.  Respiratory: Negative for dyspnea at rest, dyspnea on exertion, cough, sputum, wheezing.  GI: See history of present illness. GU:  Negative for dysuria, hematuria, urinary incontinence, urinary frequency, nocturnal urination.  Endo: Negative for unusual weight change.    Physical Examination:   BP (!) 141/75   Pulse 77   Temp (!) 97 F (36.1 C) (Oral)   Ht 5\' 8"  (1.727 m)   Wt (!) 326 lb 6.4 oz (148.1 kg)   BMI 49.63 kg/m   General: Well-nourished, well-developed in no acute distress. Obese. Eyes: No icterus. Mouth: Oropharyngeal mucosa moist and pink , no lesions erythema or exudate. Lungs: Clear to auscultation bilaterally.  Heart: Regular rate and rhythm, no murmurs rubs or gallops.  Abdomen: Bowel sounds are normal, nontender, nondistended, no hepatosplenomegaly or masses, no abdominal bruits or hernia , no rebound or guarding.   Extremities: No lower extremity edema. No clubbing or deformities. Neuro: Alert and oriented x 4   Skin: Warm and dry, no jaundice.   Psych: Alert and cooperative, normal mood and affect.  Labs:   Lab Results  Component Value Date   ALT 19 07/30/2017   AST 19 07/30/2017   ALKPHOS 81 07/30/2017   BILITOT 0.3 07/30/2017   Lab Results  Component Value Date   TSH 1.060 05/28/2017   Lab Results  Component Value Date   HGBA1C 5.6  02/04/2017       Imaging Studies: Dg Esophagus  Result Date: 11/22/2017 CLINICAL DATA:  Recurrent dysphagia. History of esophagitis and gastroesophageal  reflux. EXAM: ESOPHOGRAM / BARIUM SWALLOW / BARIUM TABLET STUDY TECHNIQUE: Combined double contrast and single contrast examination performed using effervescent crystals, thick barium liquid, and thin barium liquid. The patient was observed with fluoroscopy swallowing a 13 mm barium sulphate tablet. FLUOROSCOPY TIME:  Fluoroscopy Time: 1 min and 24 sec of low-dose pulsed fluoro Radiation Exposure Index (if provided by the fluoroscopic device): 193 mGy Number of Acquired Spot Images: 2 rapid sequence series of the cervical esophagus. COMPARISON:  Esophagram 04/30/2017. FINDINGS: Patient swallowed the barium without difficulty. The esophageal motility appears normal. Rapid sequence imaging of the pharynx in the AP and lateral projections demonstrates no mucosal abnormalities or laryngeal penetration. In the prone position, there is a small reducible hiatal hernia. There is no evidence esophageal ulceration, stricture or mass. Only trace reflux elicited with the water siphon test. A 13 mm barium tablet was administered and passed without delay into the stomach. IMPRESSION: Small reducible hiatal hernia with trace gastroesophageal reflux. No evidence of stricture. Electronically Signed   By: Richardean Sale M.D.   On: 11/22/2017 08:39

## 2017-12-16 ENCOUNTER — Ambulatory Visit: Payer: Medicaid Other | Admitting: Registered"

## 2017-12-17 ENCOUNTER — Other Ambulatory Visit: Payer: Self-pay | Admitting: Internal Medicine

## 2017-12-17 DIAGNOSIS — M797 Fibromyalgia: Secondary | ICD-10-CM

## 2017-12-17 MED FILL — GABAPENTIN 300 MG CAPSULE: 300 | 30 days supply | Qty: 180 | Fill #0

## 2017-12-17 MED FILL — AMLODIPINE BESYLATE 10 MG T: 10 | 30 days supply | Qty: 30 | Fill #2

## 2017-12-23 ENCOUNTER — Ambulatory Visit: Payer: Medicaid Other | Admitting: Registered"

## 2017-12-27 MED FILL — LOSARTAN-HCTZ 100-25 MG TAB: 100-25 | 30 days supply | Qty: 30 | Fill #4

## 2018-01-07 ENCOUNTER — Other Ambulatory Visit: Payer: Self-pay | Admitting: Internal Medicine

## 2018-01-07 ENCOUNTER — Other Ambulatory Visit: Payer: Self-pay

## 2018-01-07 DIAGNOSIS — K219 Gastro-esophageal reflux disease without esophagitis: Secondary | ICD-10-CM

## 2018-01-07 MED FILL — metFORMIN HCL 500 MG TABS: 500 | 30 days supply | Qty: 15 | Fill #2

## 2018-01-07 MED FILL — hydrALAZINE HCL 25 MG TABS: 25 | 30 days supply | Qty: 90 | Fill #0

## 2018-01-07 MED FILL — PANTOPRAZOLE SOD DR 40 MG T: 40 | 30 days supply | Qty: 60 | Fill #1

## 2018-01-10 MED FILL — MONTELUKAST SOD 10 MG TAB: 10 | 30 days supply | Qty: 30 | Fill #6

## 2018-01-10 MED FILL — AMITRIPTYLINE HCL 50 MG TAB: 50 | 30 days supply | Qty: 30 | Fill #4

## 2018-01-11 MED FILL — LINZESS 290 MCG CAPSULE: 290 | 30 days supply | Qty: 30 | Fill #3

## 2018-01-11 MED FILL — ACCU-CHEK AVIVA PLUS TEST S: 30 days supply | Qty: 100 | Fill #1

## 2018-01-11 NOTE — Patient Instructions (Signed)
[place]  01/11/2018     @PREFPERIOPPHARMACY @   Your procedure is scheduled on   01/20/2018   Report to [facility] at  33   A.M.  Call this number if you have problems the morning of surgery:  [phone]   Remember:  Do not eat food or drink liquids after midnight.  Take these medicines the morning of surgery with A SIP OF WATER  Norvasc, wellbutrin, neurontin, hydrocodone, levothyroxine, mobic, singulair, protonix. Use your inhaler before you come.   Do not wear jewelry, make-up or nail polish.  Do not wear lotions, powders, or perfumes, or deodorant.  Do not shave 48 hours prior to surgery.  Men may shave face and neck.  Do not bring valuables to the hospital.  [facility] is not responsible for any belongings or valuables.  Contacts, dentures or bridgework may not be worn into surgery.  Leave your suitcase in the car.  After surgery it may be brought to your room.  For patients admitted to the hospital, discharge time will be determined by your treatment team.  Patients discharged the day of surgery will not be allowed to drive home.   Name and phone number of your driver:   family Special instructions:  Follow the diet instructions given to you by Dr Roseanne Kaufman office.  Please read over the following fact sheets that you were given. Anesthesia Post-op Instructions and Care and Recovery After Surgery       Esophagogastroduodenoscopy Esophagogastroduodenoscopy (EGD) is a procedure to examine the lining of the esophagus, stomach, and first part of the small intestine (duodenum). This procedure is done to check for problems such as inflammation, bleeding, ulcers, or growths. During this procedure, a long, flexible, lighted tube with a camera attached (endoscope) is inserted down the throat. Tell a health care provider about:  Any allergies you have.  All medicines you are taking, including vitamins, herbs, eye drops, creams, and over-the-counter  medicines.  Any problems you or family members have had with anesthetic medicines.  Any blood disorders you have.  Any surgeries you have had.  Any medical conditions you have.  Whether you are pregnant or may be pregnant. What are the risks? Generally, this is a safe procedure. However, problems may occur, including:  Infection.  Bleeding.  A tear (perforation) in the esophagus, stomach, or duodenum.  Trouble breathing.  Excessive sweating.  Spasms of the larynx.  A slowed heartbeat.  Low blood pressure.  What happens before the procedure?  Follow instructions from your health care provider about eating or drinking restrictions.  Ask your health care provider about: ? Changing or stopping your regular medicines. This is especially important if you are taking diabetes medicines or blood thinners. ? Taking medicines such as aspirin and ibuprofen. These medicines can thin your blood. Do not take these medicines before your procedure if your health care provider instructs you not to.  Plan to have someone take you home after the procedure.  If you wear dentures, be ready to remove them before the procedure. What happens during the procedure?  To reduce your risk of infection, your health care team will wash or sanitize their hands.  An IV tube will be put in a vein in your hand or arm. You will get medicines and fluids through this tube.  You will be given one or more of the following: ? A medicine to help you relax (sedative). ?  A medicine to numb the area (local anesthetic). This medicine may be sprayed into your throat. It will make you feel more comfortable and keep you from gagging or coughing during the procedure. ? A medicine for pain.  A mouth guard may be placed in your mouth to protect your teeth and to keep you from biting on the endoscope.  You will be asked to lie on your left side.  The endoscope will be lowered down your throat into your esophagus,  stomach, and duodenum.  Air will be put into the endoscope. This will help your health care provider see better.  The lining of your esophagus, stomach, and duodenum will be examined.  Your health care provider may: ? Take a tissue sample so it can be looked at in a lab (biopsy). ? Remove growths. ? Remove objects (foreign bodies) that are stuck. ? Treat any bleeding with medicines or other devices that stop tissue from bleeding. ? Widen (dilate) or stretch narrowed areas of your esophagus and stomach.  The endoscope will be taken out. The procedure may vary among health care providers and hospitals. What happens after the procedure?  Your blood pressure, heart rate, breathing rate, and blood oxygen level will be monitored often until the medicines you were given have worn off.  Do not eat or drink anything until the numbing medicine has worn off and your gag reflex has returned. This information is not intended to replace advice given to you by your health care provider. Make sure you discuss any questions you have with your health care provider. Document Released: 01/22/2005 Document Revised: 02/27/2016 Document Reviewed: 08/15/2015 Elsevier Interactive Patient Education  2018 [employer]. Esophagogastroduodenoscopy, Care After Refer to this sheet in the next few weeks. These instructions provide you with information about caring for yourself after your procedure. Your health care provider may also give you more specific instructions. Your treatment has been planned according to current medical practices, but problems sometimes occur. Call your health care provider if you have any problems or questions after your procedure. What can I expect after the procedure? After the procedure, it is common to have:  A sore throat.  Nausea.  Bloating.  Dizziness.  Fatigue.  Follow these instructions at home:  Do not eat or drink anything until the numbing medicine (local anesthetic)  has worn off and your gag reflex has returned. You will know that the local anesthetic has worn off when you can swallow comfortably.  Do not drive for 24 hours if you received a medicine to help you relax (sedative).  If your health care provider took a tissue sample for testing during the procedure, make sure to get your test results. This is your responsibility. Ask your health care provider or the department performing the test when your results will be ready.  Keep all follow-up visits as told by your health care provider. This is important. Contact a health care provider if:  You cannot stop coughing.  You are not urinating.  You are urinating less than usual. Get help right away if:  You have trouble swallowing.  You cannot eat or drink.  You have throat or chest pain that gets worse.  You are dizzy or light-headed.  You faint.  You have nausea or vomiting.  You have chills.  You have a fever.  You have severe abdominal pain.  You have black, tarry, or bloody stools. This information is not intended to replace advice given to you by your health  care provider. Make sure you discuss any questions you have with your health care provider. Document Released: 09/07/2012 Document Revised: 02/27/2016 Document Reviewed: 08/15/2015 Elsevier Interactive Patient Education  2018 [employer].  Esophageal Dilatation Esophageal dilatation is a procedure to open a blocked or narrowed part of the esophagus. The esophagus is the long tube in your throat that carries food and liquid from your mouth to your stomach. The procedure is also called esophageal dilation. You may need this procedure if you have a buildup of scar tissue in your esophagus that makes it difficult, painful, or even impossible to swallow. This can be caused by gastroesophageal reflux disease (GERD). In rare cases, people need this procedure because they have cancer of the esophagus or a problem with the way food  moves through the esophagus. Sometimes you may need to have another dilatation to enlarge the opening of the esophagus gradually. Tell a health care provider about:  Any allergies you have.  All medicines you are taking, including vitamins, herbs, eye drops, creams, and over-the-counter medicines.  Any problems you or family members have had with anesthetic medicines.  Any blood disorders you have.  Any surgeries you have had.  Any medical conditions you have.  Any antibiotic medicines you are required to take before dental procedures. What are the risks? Generally, this is a safe procedure. However, problems can occur and include:  Bleeding from a tear in the lining of the esophagus.  A hole (perforation) in the esophagus.  What happens before the procedure?  Do not eat or drink anything after midnight on the night before the procedure or as directed by your health care provider.  Ask your health care provider about changing or stopping your regular medicines. This is especially important if you are taking diabetes medicines or blood thinners.  Plan to have someone take you home after the procedure. What happens during the procedure?  You will be given a medicine that makes you relaxed and sleepy (sedative).  A medicine may be sprayed or gargled to numb the back of the throat.  Your health care provider can use various instruments to do an esophageal dilatation. During the procedure, the instrument used will be placed in your mouth and passed down into your esophagus. Options include: ? Simple dilators. This instrument is carefully placed in the esophagus to stretch it. ? Guided wire bougies. In this method, a flexible tube (endoscope) is used to insert a wire into the esophagus. The dilator is passed over this wire to enlarge the esophagus. Then the wire is removed. ? Balloon dilators. An endoscope with a small balloon at the end is passed down into the esophagus. Inflating  the balloon gently stretches the esophagus and opens it up. What happens after the procedure?  Your blood pressure, heart rate, breathing rate, and blood oxygen level will be monitored often until the medicines you were given have worn off.  Your throat may feel slightly sore and will probably still feel numb. This will improve slowly over time.  You will not be allowed to eat or drink until the throat numbness has resolved.  If this is a same-day procedure, you may be allowed to go home once you have been able to drink, urinate, and sit on the edge of the bed without nausea or dizziness.  If this is a same-day procedure, you should have a friend or family member with you for the next 24 hours after the procedure. This information is not intended to  replace advice given to you by your health care provider. Make sure you discuss any questions you have with your health care provider. Document Released: 11/12/2005 Document Revised: 02/27/2016 Document Reviewed: 01/31/2014 Elsevier Interactive Patient Education  2018 [place] Anesthesia is a term that refers to techniques, procedures, and medicines that help a person stay safe and comfortable during a medical procedure. Monitored anesthesia care, or sedation, is one type of anesthesia. Your anesthesia specialist may recommend sedation if you will be having a procedure that does not require you to be unconscious, such as:  Cataract surgery.  A dental procedure.  A biopsy.  A colonoscopy.  During the procedure, you may receive a medicine to help you relax (sedative). There are three levels of sedation:  Mild sedation. At this level, you may feel awake and relaxed. You will be able to follow directions.  Moderate sedation. At this level, you will be sleepy. You may not remember the procedure.  Deep sedation. At this level, you will be asleep. You will not remember the procedure.  The more medicine you are  given, the deeper your level of sedation will be. Depending on how you respond to the procedure, the anesthesia specialist may change your level of sedation or the type of anesthesia to fit your needs. An anesthesia specialist will monitor you closely during the procedure. Let your health care provider know about:  Any allergies you have.  All medicines you are taking, including vitamins, herbs, eye drops, creams, and over-the-counter medicines.  Any use of steroids (by mouth or as a cream).  Any problems you or family members have had with sedatives and anesthetic medicines.  Any blood disorders you have.  Any surgeries you have had.  Any medical conditions you have, such as sleep apnea.  Whether you are pregnant or may be pregnant.  Any use of cigarettes, alcohol, or street drugs. What are the risks? Generally, this is a safe procedure. However, problems may occur, including:  Getting too much medicine (oversedation).  Nausea.  Allergic reaction to medicines.  Trouble breathing. If this happens, a breathing tube may be used to help with breathing. It will be removed when you are awake and breathing on your own.  Heart trouble.  Lung trouble.  Before the procedure Staying hydrated Follow instructions from your health care provider about hydration, which may include:  Up to 2 hours before the procedure - you may continue to drink clear liquids, such as water, clear fruit juice, black coffee, and plain tea.  Eating and drinking restrictions Follow instructions from your health care provider about eating and drinking, which may include:  8 hours before the procedure - stop eating heavy meals or foods such as meat, fried foods, or fatty foods.  6 hours before the procedure - stop eating light meals or foods, such as toast or cereal.  6 hours before the procedure - stop drinking milk or drinks that contain milk.  2 hours before the procedure - stop drinking clear  liquids.  Medicines Ask your health care provider about:  Changing or stopping your regular medicines. This is especially important if you are taking diabetes medicines or blood thinners.  Taking medicines such as aspirin and ibuprofen. These medicines can thin your blood. Do not take these medicines before your procedure if your health care provider instructs you not to.  Tests and exams  You will have a physical exam.  You may have blood tests done to show: ?  How well your kidneys and liver are working. ? How well your blood can clot.  General instructions  Plan to have someone take you home from the hospital or clinic.  If you will be going home right after the procedure, plan to have someone with you for 24 hours.  What happens during the procedure?  Your blood pressure, heart rate, breathing, level of pain and overall condition will be monitored.  An IV tube will be inserted into one of your veins.  Your anesthesia specialist will give you medicines as needed to keep you comfortable during the procedure. This may mean changing the level of sedation.  The procedure will be performed. After the procedure  Your blood pressure, heart rate, breathing rate, and blood oxygen level will be monitored until the medicines you were given have worn off.  Do not drive for 24 hours if you received a sedative.  You may: ? Feel sleepy, clumsy, or nauseous. ? Feel forgetful about what happened after the procedure. ? Have a sore throat if you had a breathing tube during the procedure. ? Vomit. This information is not intended to replace advice given to you by your health care provider. Make sure you discuss any questions you have with your health care provider. Document Released: 06/17/2005 Document Revised: 02/28/2016 Document Reviewed: 01/12/2016 Elsevier Interactive Patient Education  2018 [place], Care After These instructions provide you with  information about caring for yourself after your procedure. Your health care provider may also give you more specific instructions. Your treatment has been planned according to current medical practices, but problems sometimes occur. Call your health care provider if you have any problems or questions after your procedure. What can I expect after the procedure? After your procedure, it is common to:  Feel sleepy for several hours.  Feel clumsy and have poor balance for several hours.  Feel forgetful about what happened after the procedure.  Have poor judgment for several hours.  Feel nauseous or vomit.  Have a sore throat if you had a breathing tube during the procedure.  Follow these instructions at home: For at least 24 hours after the procedure:   Do not: ? Participate in activities in which you could fall or become injured. ? Drive. ? Use heavy machinery. ? Drink alcohol. ? Take sleeping pills or medicines that cause drowsiness. ? Make important decisions or sign legal documents. ? Take care of children on your own.  Rest. Eating and drinking  Follow the diet that is recommended by your health care provider.  If you vomit, drink water, juice, or soup when you can drink without vomiting.  Make sure you have little or no nausea before eating solid foods. General instructions  Have a responsible adult stay with you until you are awake and alert.  Take over-the-counter and prescription medicines only as told by your health care provider.  If you smoke, do not smoke without supervision.  Keep all follow-up visits as told by your health care provider. This is important. Contact a health care provider if:  You keep feeling nauseous or you keep vomiting.  You feel light-headed.  You develop a rash.  You have a fever. Get help right away if:  You have trouble breathing. This information is not intended to replace advice given to you by your health care provider.  Make sure you discuss any questions you have with your health care provider. Document Released: 01/12/2016 Document Revised: 05/13/2016 Document Reviewed: 01/12/2016  [employer] Patient Education  [employer].

## 2018-01-13 ENCOUNTER — Encounter (HOSPITAL_COMMUNITY)
Admission: RE | Admit: 2018-01-13 | Discharge: 2018-01-13 | Disposition: A | Discharge status: Home / Self Care | Payer: Medicaid Other | Source: Ambulatory Visit | Attending: Internal Medicine | Admitting: Internal Medicine

## 2018-01-17 ENCOUNTER — Encounter (HOSPITAL_COMMUNITY)
Admission: RE | Admit: 2018-01-17 | Discharge: 2018-01-17 | Disposition: A | Discharge status: Home / Self Care | Payer: Medicaid Other | Source: Ambulatory Visit | Attending: Internal Medicine | Admitting: Internal Medicine

## 2018-01-17 ENCOUNTER — Encounter (HOSPITAL_COMMUNITY): Payer: Self-pay

## 2018-01-17 ENCOUNTER — Other Ambulatory Visit: Payer: Self-pay

## 2018-01-17 DIAGNOSIS — Z01810 Encounter for preprocedural cardiovascular examination: Secondary | ICD-10-CM | POA: Insufficient documentation

## 2018-01-17 DIAGNOSIS — Z01812 Encounter for preprocedural laboratory examination: Secondary | ICD-10-CM | POA: Diagnosis present

## 2018-01-17 HISTORY — DX: Depression, unspecified: F32.A

## 2018-01-17 HISTORY — DX: Major depressive disorder, single episode, unspecified: F32.9

## 2018-01-17 HISTORY — DX: Hypothyroidism, unspecified: E03.9

## 2018-01-17 LAB — BASIC METABOLIC PANEL
Anion gap: 11 (ref 5–15)
BUN: 14 mg/dL (ref 6–20)
CO2: 25 mmol/L (ref 22–32)
CREATININE: 0.84 mg/dL (ref 0.44–1.00)
Calcium: 9.6 mg/dL (ref 8.9–10.3)
Chloride: 103 mmol/L (ref 101–111)
GFR calc Af Amer: >60 mL/min (ref >60)
GLUCOSE: 112 mg/dL — AB (ref 65–99)
Potassium: 3.7 mmol/L (ref 3.5–5.1)
SODIUM: 139 mmol/L (ref 135–145)

## 2018-01-17 LAB — CBC WITH DIFFERENTIAL/PLATELET
BASOS ABS: 0 10*3/uL (ref 0.0–0.1)
Basophils Relative: 0 %
EOS ABS: 0.1 10*3/uL (ref 0.0–0.7)
EOS PCT: 1 %
HCT: 38.6 % (ref 36.0–46.0)
Hemoglobin: 11.4 g/dL — ABNORMAL LOW (ref 12.0–15.0)
Lymphocytes Relative: 49 %
Lymphs Abs: 4.1 10*3/uL — ABNORMAL HIGH (ref 0.7–4.0)
MCH: 27.1 pg (ref 26.0–34.0)
MCHC: 29.5 g/dL — ABNORMAL LOW (ref 30.0–36.0)
MCV: 91.7 fL (ref 78.0–100.0)
MONO ABS: 1.3 10*3/uL — AB (ref 0.1–1.0)
Monocytes Relative: 15 %
Neutro Abs: 2.9 10*3/uL (ref 1.7–7.7)
Neutrophils Relative %: 35 %
PLATELETS: 395 10*3/uL (ref 150–400)
RBC: 4.21 MIL/uL (ref 3.87–5.11)
RDW: 13.8 % (ref 11.5–15.5)
WBC: 8.4 10*3/uL (ref 4.0–10.5)

## 2018-01-17 MED FILL — AMLODIPINE BESYLATE 10 MG T: 10 | 30 days supply | Qty: 30 | Fill #3

## 2018-01-17 MED FILL — LEVOTHYROXINE 88 MCG TABLET: 88 | 30 days supply | Qty: 30 | Fill #2

## 2018-01-18 ENCOUNTER — Encounter (HOSPITAL_COMMUNITY): Payer: Self-pay

## 2018-01-20 ENCOUNTER — Ambulatory Visit (HOSPITAL_COMMUNITY)
Admission: RE | Admit: 2018-01-20 | Discharge: 2018-01-20 | Disposition: A | Discharge status: Home / Self Care | Payer: Medicaid Other | Source: Ambulatory Visit | Attending: Internal Medicine | Admitting: Internal Medicine

## 2018-01-20 ENCOUNTER — Encounter (HOSPITAL_COMMUNITY): Payer: Self-pay | Admitting: Emergency Medicine

## 2018-01-20 ENCOUNTER — Ambulatory Visit (HOSPITAL_COMMUNITY): Payer: Medicaid Other | Admitting: Anesthesiology

## 2018-01-20 ENCOUNTER — Encounter (HOSPITAL_COMMUNITY)
Admission: RE | Disposition: A | Discharge status: Home / Self Care | Payer: Self-pay | Source: Ambulatory Visit | Attending: Internal Medicine

## 2018-01-20 DIAGNOSIS — Z7982 Long term (current) use of aspirin: Secondary | ICD-10-CM | POA: Diagnosis not present

## 2018-01-20 DIAGNOSIS — I10 Essential (primary) hypertension: Secondary | ICD-10-CM | POA: Diagnosis not present

## 2018-01-20 DIAGNOSIS — R6881 Early satiety: Secondary | ICD-10-CM

## 2018-01-20 DIAGNOSIS — Z79899 Other long term (current) drug therapy: Secondary | ICD-10-CM | POA: Diagnosis not present

## 2018-01-20 DIAGNOSIS — K2950 Unspecified chronic gastritis without bleeding: Secondary | ICD-10-CM | POA: Diagnosis not present

## 2018-01-20 DIAGNOSIS — K3189 Other diseases of stomach and duodenum: Secondary | ICD-10-CM | POA: Insufficient documentation

## 2018-01-20 DIAGNOSIS — E785 Hyperlipidemia, unspecified: Secondary | ICD-10-CM | POA: Diagnosis not present

## 2018-01-20 DIAGNOSIS — E039 Hypothyroidism, unspecified: Secondary | ICD-10-CM | POA: Diagnosis not present

## 2018-01-20 DIAGNOSIS — E119 Type 2 diabetes mellitus without complications: Secondary | ICD-10-CM | POA: Insufficient documentation

## 2018-01-20 DIAGNOSIS — Z79890 Hormone replacement therapy (postmenopausal): Secondary | ICD-10-CM | POA: Insufficient documentation

## 2018-01-20 DIAGNOSIS — J45909 Unspecified asthma, uncomplicated: Secondary | ICD-10-CM | POA: Insufficient documentation

## 2018-01-20 DIAGNOSIS — Z7984 Long term (current) use of oral hypoglycemic drugs: Secondary | ICD-10-CM | POA: Diagnosis not present

## 2018-01-20 DIAGNOSIS — R110 Nausea: Secondary | ICD-10-CM

## 2018-01-20 DIAGNOSIS — R1310 Dysphagia, unspecified: Secondary | ICD-10-CM | POA: Diagnosis present

## 2018-01-20 DIAGNOSIS — G4730 Sleep apnea, unspecified: Secondary | ICD-10-CM | POA: Insufficient documentation

## 2018-01-20 DIAGNOSIS — K219 Gastro-esophageal reflux disease without esophagitis: Secondary | ICD-10-CM | POA: Diagnosis not present

## 2018-01-20 DIAGNOSIS — K259 Gastric ulcer, unspecified as acute or chronic, without hemorrhage or perforation: Secondary | ICD-10-CM | POA: Insufficient documentation

## 2018-01-20 DIAGNOSIS — Z87891 Personal history of nicotine dependence: Secondary | ICD-10-CM | POA: Insufficient documentation

## 2018-01-20 HISTORY — PX: ESOPHAGOGASTRODUODENOSCOPY (EGD) WITH PROPOFOL: SHX5813

## 2018-01-20 HISTORY — PX: MALONEY DILATION: SHX5535

## 2018-01-20 LAB — GLUCOSE, CAPILLARY
GLUCOSE-CAPILLARY: 101 mg/dL — AB (ref 65–99)
Glucose-Capillary: 107 mg/dL — ABNORMAL HIGH (ref 65–99)

## 2018-01-20 SURGERY — ESOPHAGOGASTRODUODENOSCOPY (EGD) WITH PROPOFOL
Anesthesia: Monitor Anesthesia Care

## 2018-01-20 MED ORDER — MIDAZOLAM HCL 2 MG/2ML IJ SOLN
INTRAMUSCULAR | Status: AC
  Filled 2018-01-20: qty 2

## 2018-01-20 MED ORDER — MIDAZOLAM HCL 2 MG/2ML IJ SOLN: 0.5000 mg | Freq: Once | INTRAMUSCULAR | Status: DC | PRN

## 2018-01-20 MED ORDER — ACETAMINOPHEN 10 MG/ML IV SOLN: 1000.0000 mg | Freq: Once | INTRAVENOUS | Status: DC | PRN

## 2018-01-20 MED ORDER — LACTATED RINGERS IV SOLN
INTRAVENOUS | Status: DC
  Administered 2018-01-20: 08:00:00 via INTRAVENOUS

## 2018-01-20 MED ORDER — CHLORHEXIDINE GLUCONATE CLOTH 2 % EX PADS: 6.0000 | MEDICATED_PAD | Freq: Once | CUTANEOUS | Status: DC

## 2018-01-20 MED ORDER — HYDROMORPHONE HCL 1 MG/ML IJ SOLN: 0.2500 mg | INTRAMUSCULAR | Status: DC | PRN

## 2018-01-20 MED ORDER — LIDOCAINE VISCOUS 2 % MT SOLN
15.0000 mL | Freq: Once | OROMUCOSAL | Status: AC
  Administered 2018-01-20 (×2): 3 mL via OROMUCOSAL

## 2018-01-20 MED ORDER — PROPOFOL 500 MG/50ML IV EMUL
INTRAVENOUS | Status: DC | PRN
  Administered 2018-01-20: 125 ug/kg/min via INTRAVENOUS
  Administered 2018-01-20: 10:00:00 via INTRAVENOUS

## 2018-01-20 MED ORDER — GLYCOPYRROLATE 0.2 MG/ML IJ SOLN
INTRAMUSCULAR | Status: DC | PRN
  Administered 2018-01-20: 0.2 mg via INTRAVENOUS

## 2018-01-20 MED ORDER — LIDOCAINE VISCOUS 2 % MT SOLN
OROMUCOSAL | Status: AC
  Filled 2018-01-20: qty 15

## 2018-01-20 MED ORDER — PROMETHAZINE HCL 25 MG/ML IJ SOLN: 6.2500 mg | INTRAMUSCULAR | Status: DC | PRN

## 2018-01-20 MED ORDER — MIDAZOLAM HCL 5 MG/5ML IJ SOLN
INTRAMUSCULAR | Status: DC | PRN
  Administered 2018-01-20: 2 mg via INTRAVENOUS

## 2018-01-20 MED ORDER — PROPOFOL 10 MG/ML IV BOLUS
INTRAVENOUS | Status: AC
  Filled 2018-01-20: qty 40

## 2018-01-20 NOTE — Discharge Instructions (Signed)
EGD Discharge instructions Please read the instructions outlined below and refer to this sheet in the next few weeks. These discharge instructions provide you with general information on caring for yourself after you leave the hospital. Your doctor may also give you specific instructions. While your treatment has been planned according to the most current medical practices available, unavoidable complications occasionally occur. If you have any problems or questions after discharge, please call your doctor. ACTIVITY  You may resume your regular activity but move at a slower pace for the next 24 hours.   Take frequent rest periods for the next 24 hours.   Walking will help expel (get rid of) the air and reduce the bloated feeling in your abdomen.   No driving for 24 hours (because of the anesthesia (medicine) used during the test).   You may shower.   Do not sign any important legal documents or operate any machinery for 24 hours (because of the anesthesia used during the test).  NUTRITION  Drink plenty of fluids.   You may resume your normal diet.   Begin with a light meal and progress to your normal diet.   Avoid alcoholic beverages for 24 hours or as instructed by your caregiver.  MEDICATIONS  You may resume your normal medications unless your caregiver tells you otherwise.  WHAT YOU CAN EXPECT TODAY  You may experience abdominal discomfort such as a feeling of fullness or gas pains.  FOLLOW-UP  Your doctor will discuss the results of your test with you.  SEEK IMMEDIATE MEDICAL ATTENTION IF ANY OF THE FOLLOWING OCCUR:  Excessive nausea (feeling sick to your stomach) and/or vomiting.   Severe abdominal pain and distention (swelling).   Trouble swallowing.   Temperature over 101 F (37.8 C).   Rectal bleeding or vomiting of blood.    Continue Protonix 40 mg twice daily  Office visit with [name] in 3 months  Further recommendations to follow pending review of biopsy  report

## 2018-01-20 NOTE — Op Note (Signed)
[facility] Patient Name: Robin [name] Procedure Date: 01/20/2018 8:56 AM MRN: [id number] Date of Birth: 1956/02/05 Attending MD: Norvel Richards , MD CSN: [id number] Age: 62 Admit Type: Outpatient Procedure:                Upper GI endoscopy Indications:              Dysphagia Providers:                Norvel Richards, MD, Hinton Rao, RN, Aram Candela Referring MD:             Ladell Pier Medicines:                Propofol per Anesthesia Complications:            No immediate complications. Estimated Blood Loss:     Estimated blood loss was minimal. Procedure:                Pre-Anesthesia Assessment:                           - Prior to the procedure, a History and Physical                            was performed, and patient medications and                            allergies were reviewed. The patient's tolerance of                            previous anesthesia was also reviewed. The risks                            and benefits of the procedure and the sedation                            options and risks were discussed with the patient.                            All questions were answered, and informed consent                            was obtained. Prior Anticoagulants: The patient has                            taken no previous anticoagulant or antiplatelet                            agents. ASA Grade Assessment: II - A patient with                            mild systemic disease. After reviewing the risks  and benefits, the patient was deemed in                            satisfactory condition to undergo the procedure.                           After obtaining informed consent, the endoscope was                            passed under direct vision. Throughout the                            procedure, the patient's blood pressure, pulse, and                            oxygen saturations  were monitored continuously. The                            [id number]) scope was introduced through the                            and advanced to the second part of duodenum. The                            upper GI endoscopy was accomplished without                            difficulty. The patient tolerated the procedure                            well. Scope In: 9:26:35 AM Scope Out: 9:34:51 AM Total Procedure Duration: 0 hours 8 minutes 16 seconds  Findings:      The examined esophagus was normal. scope reinsertion and showed no       change. Estimated blood loss: none.      multiple body and antral erosions seen 2-3 mm in dimensions. No ulcer or       infiltrating process..      The duodenal bulb and second portion of the duodenum were normal. The       scope was withdrawn. Dilation was performed with a Maloney dilator with       mild resistance at 63 Fr. The scope was withdrawn. Dilation was       performed with a Maloney dilator with moderate resistance at 56 Fr. The       dilation site was examined following endoscope reinsertion and showed no       change. Estimated blood loss: none. Gastric erosions were biopsied with       a cold forceps for histology. Estimated blood loss was minimal. Impression:               - Normal esophagus. Dilated.                           - Erosive gastropathy. Biopsied.                           -  Normal duodenal bulb and second portion of the                            duodenum. Moderate Sedation:      Moderate (conscious) sedation was personally administered by an       anesthesia professional. The following parameters were monitored: oxygen       saturation, heart rate, blood pressure, respiratory rate, EKG, adequacy       of pulmonary ventilation, and response to care. Total physician       intraservice time was 15 minutes. Recommendation:           - Patient has a contact number available for                            emergencies.  The signs and symptoms of potential                            delayed complications were discussed with the                            patient. Return to normal activities tomorrow.                            Written discharge instructions were provided to the                            patient.                           - Resume previous diet.                           - Continue present medications.                           - No repeat upper endoscopy.                           - Return to GI office in 3 months. Procedure Code(s):        --- Professional ---                           [id number], Esophagogastroduodenoscopy, flexible,                            transoral; with biopsy, single or multiple                           43450, Dilation of esophagus, by unguided sound or                            bougie, single or multiple passes Diagnosis Code(s):        --- Professional ---                           K31.89, Other diseases of stomach and duodenum  R13.10, Dysphagia, unspecified CPT copyright 2017 American Medical Association. All rights reserved. The codes documented in this report are preliminary and upon coder review may  be revised to meet current compliance requirements. Cristopher Estimable. Veryl Abril, MD Norvel Richards, MD 01/20/2018 9:53:28 AM This report has been signed electronically. Number of Addenda: 0

## 2018-01-20 NOTE — Transfer of Care (Signed)
Immediate Anesthesia Transfer of Care Note  Patient: Robin [name]  Procedure(s) Performed: ESOPHAGOGASTRODUODENOSCOPY (EGD) WITH PROPOFOL (N/A ) MALONEY DILATION (N/A )  Patient Location: PACU  Anesthesia Type:MAC  Level of Consciousness: drowsy  Airway & Oxygen Therapy: Patient Spontanous Breathing and Patient connected to nasal cannula oxygen  Post-op Assessment: Report given to RN, Post -op Vital signs reviewed and stable and Patient moving all extremities  Post vital signs: Reviewed and stable  Last Vitals:  Vitals Value Taken Time  BP    Temp    Pulse 65 01/20/2018  9:38 AM  Resp    SpO2 98 % 01/20/2018  9:38 AM  Vitals shown include unvalidated device data.  Last Pain:  Vitals:   01/20/18 0735  TempSrc: Oral  PainSc: 0-No pain         Complications: No apparent anesthesia complications

## 2018-01-20 NOTE — H&P (Signed)
@LOGO @   Primary Care Physician:  Ladell Pier, MD Primary Gastroenterologist:  Dr. [name]  Pre-Procedure History & Physical: HPI:  Robin [name] is a 62 y.o. female here for here for further evaluation of dysphagia via EGD. Reflux symptoms controlled on Protonix 40 mg twice daily.    Past of 77 French Maloney dilator 3 years did help with the dysphagia for quite a while before they recurred insidiously.  Past Medical History:  Diagnosis Date  . Anxiety disorder   . Arthritis   . Asthma   . Chronic neck pain   . Constipation   . Depression   . Diabetes mellitus without complication ([place])   . GERD (gastroesophageal reflux disease)   . Hiatal hernia    small  . Hyperlipidemia   . Hypertension   . Hypothyroidism   . Schatzki's ring    non critical  . Sleep apnea    CPAP, Sleep study at [place]  . Sleep apnea     Past Surgical History:  Procedure Laterality Date  . ABDOMINAL HYSTERECTOMY    . ABDOMINAL HYSTERECTOMY  02/18/2000  . CARDIAC CATHETERIZATION  08/18/2003   normal L main/LAD/L Cfx/RCA (Dr. Adora Fridge)  . COLONOSCOPY  2006   Dr. [name], hyperplastic polyps  . COLONOSCOPY  08/06/11   abnormal terminal ileum for 10cm, erosions, geographical ulceration. Bx small bowel mucosa with prominent intramucosal lymphoid aggregates, slightly inflammed  . DIRECT LARYNGOSCOPY  03/15/2012   Procedure: DIRECT LARYNGOSCOPY;  Surgeon: [name], MD;  Location: [place];  Service: ENT;  Laterality: N/A; Dr. Wolicki--:>no foreign body seen. normal esophagus to 40cm  . ESOPHAGEAL DILATION  04/17/2015   Procedure: ESOPHAGEAL DILATION;  Surgeon: [name], MD;  [facility];  Service: Endoscopy;;  . ESOPHAGOGASTRODUODENOSCOPY  08/23/2008   [facility] distal esophageal erosions consistent with mild erosive reflux esophagitis, otherwise unremarkable esophagus/ Tiny antral erosions of doubtful clinical significance, otherwise normal stomach, patent  pylorus, normal D1 and D2  . ESOPHAGOGASTRODUODENOSCOPY  08/06/11   small hh, noncritical Schatzki's ring s/p 78 F  . ESOPHAGOGASTRODUODENOSCOPY N/A 04/17/2015   NOB:[facility] s/p dilation  . ESOPHAGOSCOPY  03/15/2012   Procedure: ESOPHAGOSCOPY;  Surgeon: [name], MD;  Location: [place];  Service: ENT;  Laterality: N/A;  . KNEE ARTHROSCOPY  04/27/2001  . NM MYOCAR PERF WALL MOTION  2003   negative bruce protocol exercise tress test; EF 68%; intermediate risk study due to evidence of anterior wall ischemia extending from mid-ventricle to apex  . PITUITARY SURGERY  06/2012   benign tumor[facility]  . RECTOCELE REPAIR    . TRANSTHORACIC ECHOCARDIOGRAM  2003   EF normal  . VAGINAL PROLAPSE REPAIR    . VIDEO BRONCHOSCOPY Bilateral 03/08/2017   Procedure: VIDEO BRONCHOSCOPY WITHOUT FLUORO;  Surgeon: Javier Glazier, MD;  Location: [place];  Service: Cardiopulmonary;  Laterality: Bilateral;    Prior to Admission medications   Medication Sig Start Date End Date Taking? Authorizing Provider  acetaminophen (TYLENOL) 500 MG tablet Take 1 tablet (500 mg total) by mouth every 6 (six) hours as needed for headache. Patient taking differently: Take 1,000 mg by mouth 2 (two) times daily.  02/04/17  Yes Hairston, Maylon Peppers, FNP  albuterol (PROVENTIL HFA;VENTOLIN HFA) 108 (90 Base) MCG/ACT inhaler Inhale 2 puffs into the lungs every 6 (six) hours as needed for wheezing or shortness of breath. 11/25/17  Yes Ladell Pier, MD  amitriptyline (ELAVIL) 50 MG tablet  Take 1 tablet (50 mg total) by mouth at bedtime. 09/03/17  Yes Ladell Pier, MD  amLODipine (NORVASC) 10 MG tablet Take 1 tablet (10 mg total) by mouth daily. 10/14/17  Yes Ladell Pier, MD  aspirin 81 MG tablet Take 1 tablet (81 mg total) by mouth at bedtime. Reported on 12/09/2015 09/22/16  Yes Langeland, Dawn T, MD  benzonatate (TESSALON) 100 MG capsule TAKE 1 CAPSULE BY MOUTH 3 TIMES DAILY AS NEEDED FOR COUGH. Patient  taking differently: Take 200 mg by mouth 3 (three) times daily as needed for cough.  10/19/17  Yes Parrett, Tammy S, NP  buPROPion (WELLBUTRIN XL) 300 MG 24 hr tablet Take 1 tablet (300 mg total) by mouth every morning. Reported on 12/09/2015 10/14/17  Yes Ladell Pier, MD  calcium carbonate (TUMS - DOSED IN MG ELEMENTAL CALCIUM) 500 MG chewable tablet Chew 1 tablet by mouth daily as needed for indigestion or heartburn.   Yes [provider]  diclofenac sodium (VOLTAREN) 1 % GEL Apply 2 g topically 4 (four) times daily. 05/28/17  Yes Ladell Pier, MD  fluticasone (FLONASE) 50 MCG/ACT nasal spray Place 2 sprays into both nostrils daily. 02/04/17  Yes Hairston, Mandesia R, FNP  gabapentin (NEURONTIN) 300 MG capsule TAKE 2 CAPSULES BY MOUTH 3 TIMES DAILY. 12/17/17  Yes Ladell Pier, MD  Glucose Blood (BLOOD GLUCOSE TEST STRIPS) STRP Use as directed. One Touch. 07/30/17  Yes Ladell Pier, MD  hydrALAZINE (APRESOLINE) 25 MG tablet TAKE 1 TABLET BY MOUTH 3 TIMES DAILY 01/07/18  Yes Ladell Pier, MD  HYDROcodone-acetaminophen (NORCO/VICODIN) 5-325 MG tablet Take 1 tablet by mouth every 12 (twelve) hours.   Yes [provider]  ipratropium (ATROVENT) 0.06 % nasal spray Place 2 sprays into both nostrils 4 (four) times daily. Patient taking differently: Place 1 spray into both nostrils as needed for rhinitis.  09/30/16  Yes Kindl, Nelda Severe, MD  levothyroxine (SYNTHROID, LEVOTHROID) 88 MCG tablet TAKE 1 TABLET BY MOUTH DAILY. 11/15/17  Yes Ladell Pier, MD  LINZESS 290 MCG CAPS capsule TAKE 1 CAPSULE BY MOUTH DAILY BEFORE BREAKFAST. 09/09/17  Yes Carlis Stable, NP  loratadine (CLARITIN) 10 MG tablet Take 1 tablet (10 mg total) by mouth daily. Patient taking differently: Take 10 mg by mouth daily as needed for allergies.  12/09/15  Yes Langeland, Dawn T, MD  losartan-hydrochlorothiazide (HYZAAR) 100-25 MG tablet Take 1 tablet daily by mouth. Reported on 12/09/2015 08/13/17  Yes  Ladell Pier, MD  meloxicam (MOBIC) 15 MG tablet Take 15 mg by mouth daily.   Yes [provider]  metFORMIN (GLUCOPHAGE) 500 MG tablet TAKE 1/2 TABLET DAILY WITH BREAKFAST BY MOUTH. 11/15/17  Yes Ladell Pier, MD  methocarbamol (ROBAXIN) 500 MG tablet Take 1 tablet (500 mg total) by mouth every 8 (eight) hours as needed for muscle spasms. Patient taking differently: Take 500 mg by mouth at bedtime as needed for muscle spasms.  01/08/17  Yes Jegede, Marlena Clipper, MD  montelukast (SINGULAIR) 10 MG tablet Take 1 tablet (10 mg total) by mouth at bedtime. 06/23/17  Yes Javier Glazier, MD  Multiple Vitamin (MULTIVITAMIN) tablet Take 1 tablet by mouth daily. Reported on 12/09/2015 12/09/15  Yes Langeland, Dawn T, MD  Omega-3 Fatty Acids (FISH OIL PO) Take 1 capsule by mouth daily.   Yes [provider]  pantoprazole (PROTONIX) 40 MG tablet Take 1 tablet (40 mg total) by mouth 2 (two) times daily before a  meal. 12/14/17  Yes Mahala Menghini, PA-C    Allergies as of 12/14/2017 - Review Complete 12/14/2017  Allergen Reaction Noted  . Celexa [citalopram hydrobromide] Other (See Comments) 05/28/2017    Family History  Problem Relation Age of Onset  . Kidney cancer Father   . Hypertension Father   . Hyperlipidemia Father   . Sleep apnea Father   . Diabetes Mother   . Hypertension Mother   . Heart Problems Mother   . Hyperlipidemia Mother   . Asthma Mother   . Sleep apnea Mother   . Liver disease Brother   . Arthritis Brother   . Hypertension Sister   . Allergic rhinitis Sister   . Stomach cancer Unknown        aunt  . Breast cancer Maternal Grandmother   . Kidney disease Maternal Grandfather   . Breast cancer Paternal Grandmother   . Hypertension Brother   . Hyperlipidemia Brother   . Hypertension Brother   . Hypertension Sister   . Hyperlipidemia Sister   . Lupus Maternal Aunt   . Colon cancer Neg Hx   . Eczema Neg Hx   . Immunodeficiency Neg Hx   .  Urticaria Neg Hx     Social History   Socioeconomic History  . Marital status: Married    Spouse name: Not on file  . Number of children: 4  . Years of education: 10  . Highest education level: Not on file  Occupational History    Employer: UNEMPLOYED  Social Needs  . Financial resource strain: Not on file  . Food insecurity:    Worry: Not on file    Inability: Not on file  . Transportation needs:    Medical: Not on file    Non-medical: Not on file  Tobacco Use  . Smoking status: Former Smoker    Packs/day: 0.50    Years: 10.00    Pack years: 5.00    Types: Cigarettes    Start date: 07/14/1975    Last attempt to quit: 04/04/1993    Years since quitting: 24.8  . Smokeless tobacco: Never Used  . Tobacco comment: 17 yrs ago  Substance and Sexual Activity  . Alcohol use: No    Alcohol/week: 0.0 oz  . Drug use: No  . Sexual activity: Not on file  Lifestyle  . Physical activity:    Days per week: Not on file    Minutes per session: Not on file  . Stress: Not on file  Relationships  . Social connections:    Talks on phone: Not on file    Gets together: Not on file    Attends religious service: Not on file    Active member of club or organization: Not on file    Attends meetings of clubs or organizations: Not on file    Relationship status: Not on file  . Intimate partner violence:    Fear of current or ex partner: Not on file    Emotionally abused: Not on file    Physically abused: Not on file    Forced sexual activity: Not on file  Other Topics Concern  . Not on file  Social History Narrative   Lives with husband in an apartment on the first floor.  Has 4 children.     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  Trying to get disability.  Formerly worked as a Teacher, early years/pre.  Education: 11th grade.  [facility] (12/23/16):   Originally from [facility] [place] [facility] [place] [facility]. She was raised in [place]. 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 No pets currently. No bird exposure. She does have mold in her current home in the bathroom, laundry room, & master bedroom.     Review of Systems: See HPI, otherwise negative ROS  Physical Exam: BP (!) 131/55   Pulse 81   Temp 98.6 F (37 C) (Oral)   Resp 20   SpO2 96%  General:   Alert,  Well-developed, well-nourished, pleasant and cooperative in NAD Neck:  Supple; no masses or thyromegaly. No significant cervical adenopathy. Lungs:  Clear throughout to auscultation.   No wheezes, crackles, or rhonchi. No acute distress. Heart:  Regular rate and rhythm; no murmurs, clicks, rubs,  or gallops. Abdomen: Non-distended, normal bowel sounds.  Soft and nontender without appreciable mass or hepatosplenomegaly.  Pulses:  Normal pulses noted. Extremities:  Without clubbing or edema.  Impression: 62 year old lady with recurrent esophageal dysphagia. Here for EGD with esophageal dilation.  Recommendations:  I have offered the patient an EGD with esophageal dilation as feasible/appropriate per plan.  The risks, benefits, limitations, alternatives and imponderables have been reviewed with the patient. Potential for esophageal dilation, biopsy, etc. have also been reviewed.  Questions have been answered. All parties agreeable.   Notice: This dictation was prepared with Dragon dictation along with smaller phrase technology. Any transcriptional errors that result from this process are unintentional and may not be corrected upon review.

## 2018-01-20 NOTE — Anesthesia Postprocedure Evaluation (Signed)
Anesthesia Post Note  Patient: Robin [name]  Procedure(s) Performed: ESOPHAGOGASTRODUODENOSCOPY (EGD) WITH PROPOFOL (N/A ) MALONEY DILATION (N/A )  Patient location during evaluation: PACU Anesthesia Type: MAC Level of consciousness: awake and patient cooperative Pain management: pain level controlled Vital Signs Assessment: post-procedure vital signs reviewed and stable Respiratory status: spontaneous breathing, nonlabored ventilation and respiratory function stable Cardiovascular status: blood pressure returned to baseline Postop Assessment: no apparent nausea or vomiting Anesthetic complications: no     Last Vitals:  Vitals:   01/20/18 0900 01/20/18 0943  BP: 138/67   Pulse:    Resp:    Temp:  (P) 36.8 C  SpO2: 96%     Last Pain:  Vitals:   01/20/18 0735  TempSrc: Oral  PainSc: 0-No pain                 Amadeus Oyama J

## 2018-01-20 NOTE — Anesthesia Preprocedure Evaluation (Signed)
Anesthesia Evaluation  Patient identified by MRN, date of birth, ID band Patient awake    Reviewed: Allergy & Precautions, H&P , NPO status , Patient's Chart, lab work & pertinent test results, reviewed documented beta blocker date and time   Airway Mallampati: III  TM Distance: >3 FB Neck ROM: full    Dental no notable dental hx. (+) Teeth Intact, Chipped   Pulmonary neg pulmonary ROS, former smoker,    Pulmonary exam normal breath sounds clear to auscultation       Cardiovascular Exercise Tolerance: Good hypertension, negative cardio ROS Normal cardiovascular exam Rhythm:regular Rate:Normal     Neuro/Psych negative neurological ROS  negative psych ROS   GI/Hepatic negative GI ROS, Neg liver ROS,   Endo/Other  negative endocrine ROSdiabetes  Renal/GU negative Renal ROS  negative genitourinary   Musculoskeletal   Abdominal   Peds  Hematology negative hematology ROS (+)   Anesthesia Other Findings No clinical complaints; DOE  Reproductive/Obstetrics negative OB ROS                             Anesthesia Physical Anesthesia Plan  ASA: III  Anesthesia Plan: MAC   Post-op Pain Management:    Induction:   PONV Risk Score and Plan:   Airway Management Planned:   Additional Equipment:   Intra-op Plan:   Post-operative Plan:   Informed Consent: I have reviewed the patients History and Physical, chart, labs and discussed the procedure including the risks, benefits and alternatives for the proposed anesthesia with the patient or authorized representative who has indicated his/her understanding and acceptance.   Dental Advisory Given  Plan Discussed with: CRNA  Anesthesia Plan Comments:         Anesthesia Quick Evaluation

## 2018-01-24 ENCOUNTER — Other Ambulatory Visit: Payer: Self-pay | Admitting: Internal Medicine

## 2018-01-24 ENCOUNTER — Encounter: Payer: Self-pay | Admitting: Internal Medicine

## 2018-01-24 DIAGNOSIS — M797 Fibromyalgia: Secondary | ICD-10-CM

## 2018-01-24 MED FILL — GABAPENTIN 300 MG CAPSULE: 300 | 30 days supply | Qty: 180 | Fill #0

## 2018-01-24 MED FILL — LOSARTAN-HCTZ 100-25 MG TAB: 100-25 | 30 days supply | Qty: 30 | Fill #5

## 2018-01-25 MED FILL — BUPROPION HCL XL 300 MG TAB: 300 | 30 days supply | Qty: 30 | Fill #3

## 2018-01-27 ENCOUNTER — Other Ambulatory Visit (HOSPITAL_COMMUNITY)
Admission: RE | Admit: 2018-01-27 | Discharge: 2018-01-27 | Disposition: A | Discharge status: Home / Self Care | Payer: Medicaid Other | Source: Ambulatory Visit | Attending: Internal Medicine | Admitting: Internal Medicine

## 2018-01-27 ENCOUNTER — Encounter: Payer: Self-pay | Admitting: Internal Medicine

## 2018-01-27 ENCOUNTER — Ambulatory Visit: Payer: Medicaid Other | Attending: Internal Medicine | Admitting: Internal Medicine

## 2018-01-27 VITALS — BP 155/81 | HR 80 | Temp 98.1°F | Resp 16 | Wt 328.6 lb

## 2018-01-27 DIAGNOSIS — M1990 Unspecified osteoarthritis, unspecified site: Secondary | ICD-10-CM | POA: Insufficient documentation

## 2018-01-27 DIAGNOSIS — Z888 Allergy status to other drugs, medicaments and biological substances status: Secondary | ICD-10-CM | POA: Diagnosis not present

## 2018-01-27 DIAGNOSIS — Z9889 Other specified postprocedural states: Secondary | ICD-10-CM | POA: Diagnosis not present

## 2018-01-27 DIAGNOSIS — N898 Other specified noninflammatory disorders of vagina: Secondary | ICD-10-CM | POA: Insufficient documentation

## 2018-01-27 DIAGNOSIS — R7303 Prediabetes: Secondary | ICD-10-CM | POA: Insufficient documentation

## 2018-01-27 DIAGNOSIS — Z8249 Family history of ischemic heart disease and other diseases of the circulatory system: Secondary | ICD-10-CM | POA: Diagnosis not present

## 2018-01-27 DIAGNOSIS — Z7984 Long term (current) use of oral hypoglycemic drugs: Secondary | ICD-10-CM | POA: Insufficient documentation

## 2018-01-27 DIAGNOSIS — Z87891 Personal history of nicotine dependence: Secondary | ICD-10-CM | POA: Diagnosis not present

## 2018-01-27 DIAGNOSIS — Z79899 Other long term (current) drug therapy: Secondary | ICD-10-CM | POA: Diagnosis not present

## 2018-01-27 DIAGNOSIS — M159 Polyosteoarthritis, unspecified: Secondary | ICD-10-CM

## 2018-01-27 DIAGNOSIS — Z8261 Family history of arthritis: Secondary | ICD-10-CM | POA: Insufficient documentation

## 2018-01-27 DIAGNOSIS — G4733 Obstructive sleep apnea (adult) (pediatric): Secondary | ICD-10-CM | POA: Diagnosis not present

## 2018-01-27 DIAGNOSIS — Z833 Family history of diabetes mellitus: Secondary | ICD-10-CM | POA: Insufficient documentation

## 2018-01-27 DIAGNOSIS — Z7982 Long term (current) use of aspirin: Secondary | ICD-10-CM | POA: Insufficient documentation

## 2018-01-27 DIAGNOSIS — Z7951 Long term (current) use of inhaled steroids: Secondary | ICD-10-CM | POA: Diagnosis not present

## 2018-01-27 DIAGNOSIS — Z90710 Acquired absence of both cervix and uterus: Secondary | ICD-10-CM | POA: Diagnosis not present

## 2018-01-27 DIAGNOSIS — R600 Localized edema: Secondary | ICD-10-CM | POA: Insufficient documentation

## 2018-01-27 DIAGNOSIS — Z79890 Hormone replacement therapy (postmenopausal): Secondary | ICD-10-CM | POA: Insufficient documentation

## 2018-01-27 DIAGNOSIS — R1310 Dysphagia, unspecified: Secondary | ICD-10-CM | POA: Diagnosis not present

## 2018-01-27 DIAGNOSIS — Z6841 Body Mass Index (BMI) 40.0 and over, adult: Secondary | ICD-10-CM | POA: Insufficient documentation

## 2018-01-27 DIAGNOSIS — I10 Essential (primary) hypertension: Secondary | ICD-10-CM | POA: Diagnosis not present

## 2018-01-27 DIAGNOSIS — M797 Fibromyalgia: Secondary | ICD-10-CM | POA: Insufficient documentation

## 2018-01-27 DIAGNOSIS — E039 Hypothyroidism, unspecified: Secondary | ICD-10-CM | POA: Diagnosis not present

## 2018-01-27 DIAGNOSIS — K219 Gastro-esophageal reflux disease without esophagitis: Secondary | ICD-10-CM | POA: Insufficient documentation

## 2018-01-27 DIAGNOSIS — Z8379 Family history of other diseases of the digestive system: Secondary | ICD-10-CM | POA: Diagnosis not present

## 2018-01-27 LAB — POCT GLYCOSYLATED HEMOGLOBIN (HGB A1C): HEMOGLOBIN A1C: 5.7

## 2018-01-27 LAB — GLUCOSE, POCT (MANUAL RESULT ENTRY): POC GLUCOSE: 109 mg/dL — AB (ref 70–99)

## 2018-01-27 MED ORDER — FUROSEMIDE 40 MG PO TABS: ORAL_TABLET | ORAL | 6 refills | Status: DC

## 2018-01-27 MED ORDER — LOSARTAN POTASSIUM 100 MG PO TABS: 100.0000 mg | ORAL_TABLET | Freq: Every day | ORAL | 6 refills | Status: DC

## 2018-01-27 MED FILL — PROVENTIL HFA 108 (90 BASE): 108 (90 BAS | 25 days supply | Qty: 7 | Fill #1

## 2018-01-27 MED FILL — LOSARTAN POTASSIUM 100 MG T: 100 | 30 days supply | Qty: 30 | Fill #0

## 2018-01-27 MED FILL — FUROSEMIDE 40 MG TAB: 40 | 30 days supply | Qty: 30 | Fill #0

## 2018-01-27 NOTE — Progress Notes (Signed)
Patient ID: Robin [name], female    DOB: Mar 11, 1956  MRN: [id number]  CC: Diabetes and Hypertension   Subjective: Robin [name] is a 62 y.o. female who presents for chronic ds managment Her concerns today include:  Patient with history of hypertension, pre-diabetes, obstructive sleep apnea, fibromyalgia, depression, obesity, GERD, hypothyroid, OA of multiple js (hands, knees, shoulders)  1.  Dysphagia: Had EGD with esophageal dilatation done last wk by GI.  Nl esophagus with some erosive gastritis.  2.  OA:  Never got called for Rheumatology appt.  Was declined by Dr. Nancee Liter; probably does not take Medicaid. -taking Mobic "which takes the edge of a little.  The pain get real bad sometimes."  She has established at [facility] in 08/2018. On Norco TID. "It takes the edge off."  Did P.T x 4 wks but did not find it helpful.  She would still like to see a Rheumatologist if there is one in the area who takes Medicaid -does exercise tape a few times a wk.  Also walking every day to every other day  3.  Pre-DM: compliant with Metformin.  She has seen a nutritionist.  "I'm doing a lot better.  I've been cutting back on my sweets.  Eating a lot more fruits and vegetables."  4.  HTN:  Compliant with meds -swelling in legs x 3 wks. no chest pain/shortness of breath  5.  Vaginal itching never went away from last time.  No vaginal dischg  Patient Active Problem List   Diagnosis Date Noted  . Dysphagia 12/14/2017  . Acute bronchitis 10/19/2017  . Immunization due 07/30/2017  . 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 Early satiety 06/16/2017  . Nausea without vomiting 06/16/2017  . Hx of iron deficiency anemia 05/28/2017  . Prediabetes 02/04/2017  . Chronic allergic rhinitis 12/23/2016  . Hemoptysis 12/23/2016  . Fibromyalgia 08/18/2016  . Chronic lymphocytic thyroiditis 08/18/2016  . Depression 04/01/2016  . OSA (obstructive sleep apnea) 04/01/2016  . HTN (hypertension), benign  12/09/2015  . Hypothyroidism 12/09/2015  . Morbid obesity ([place]) 12/09/2015  . Constipation 05/27/2015  . Fatty liver 05/27/2015  . Abnormal small bowel biopsy 09/18/2011  . Lactose intolerance 09/18/2011  . GERD 01/21/2010     Current Outpatient Medications on File Prior to Visit  Medication Sig Dispense Refill  . acetaminophen (TYLENOL) 500 MG tablet Take 1 tablet (500 mg total) by mouth every 6 (six) hours as needed for headache. (Patient taking differently: Take 1,000 mg by mouth 2 (two) times daily. ) 30 tablet 0  . albuterol (PROVENTIL HFA;VENTOLIN HFA) 108 (90 Base) MCG/ACT inhaler Inhale 2 puffs into the lungs every 6 (six) hours as needed for wheezing or shortness of breath. 1 Inhaler 2  . amitriptyline (ELAVIL) 50 MG tablet Take 1 tablet (50 mg total) by mouth at bedtime. 60 tablet 3  . amLODipine (NORVASC) 10 MG tablet Take 1 tablet (10 mg total) by mouth daily. 90 tablet 3  . aspirin 81 MG tablet Take 1 tablet (81 mg total) by mouth at bedtime. Reported on 12/09/2015 90 tablet 3  . benzonatate (TESSALON) 100 MG capsule TAKE 1 CAPSULE BY MOUTH 3 TIMES DAILY AS NEEDED FOR COUGH. (Patient taking differently: Take 200 mg by mouth 3 (three) times daily as needed for cough. ) 30 capsule 3  . buPROPion (WELLBUTRIN XL) 300 MG 24 hr tablet Take 1 tablet (300 mg total) by mouth every morning. Reported on 12/09/2015 90 tablet 3  . calcium carbonate (  TUMS - DOSED IN MG ELEMENTAL CALCIUM) 500 MG chewable tablet Chew 1 tablet by mouth daily as needed for indigestion or heartburn.    . diclofenac sodium (VOLTAREN) 1 % GEL Apply 2 g topically 4 (four) times daily. 100 g 2  . fluticasone (FLONASE) 50 MCG/ACT nasal spray Place 2 sprays into both nostrils daily. 16 g 6  . gabapentin (NEURONTIN) 300 MG capsule TAKE 2 CAPSULES BY MOUTH 3 TIMES DAILY. 180 capsule 0  . Glucose Blood (BLOOD GLUCOSE TEST STRIPS) STRP Use as directed. One Touch. 100 each 1  . hydrALAZINE (APRESOLINE) 25 MG tablet TAKE 1 TABLET  BY MOUTH 3 TIMES DAILY 90 tablet 2  . HYDROcodone-acetaminophen (NORCO/VICODIN) 5-325 MG tablet Take 1 tablet by mouth every 12 (twelve) hours.    Marland Kitchen ipratropium (ATROVENT) 0.06 % nasal spray Place 2 sprays into both nostrils 4 (four) times daily. (Patient taking differently: Place 1 spray into both nostrils as needed for rhinitis. ) 15 mL 1  . levothyroxine (SYNTHROID, LEVOTHROID) 88 MCG tablet TAKE 1 TABLET BY MOUTH DAILY. 90 tablet 0  . LINZESS 290 MCG CAPS capsule TAKE 1 CAPSULE BY MOUTH DAILY BEFORE BREAKFAST. 30 capsule 5  . loratadine (CLARITIN) 10 MG tablet Take 1 tablet (10 mg total) by mouth daily. (Patient taking differently: Take 10 mg by mouth daily as needed for allergies. ) 30 tablet 11  . meloxicam (MOBIC) 15 MG tablet Take 15 mg by mouth daily.    . metFORMIN (GLUCOPHAGE) 500 MG tablet TAKE 1/2 TABLET DAILY WITH BREAKFAST BY MOUTH. 45 tablet 0  . methocarbamol (ROBAXIN) 500 MG tablet Take 1 tablet (500 mg total) by mouth every 8 (eight) hours as needed for muscle spasms. (Patient taking differently: Take 500 mg by mouth at bedtime as needed for muscle spasms. ) 60 tablet 1  . montelukast (SINGULAIR) 10 MG tablet Take 1 tablet (10 mg total) by mouth at bedtime. 30 tablet 11  . Multiple Vitamin (MULTIVITAMIN) tablet Take 1 tablet by mouth daily. Reported on 12/09/2015 90 tablet 1  . Omega-3 Fatty Acids (FISH OIL PO) Take 1 capsule by mouth daily.    . pantoprazole (PROTONIX) 40 MG tablet Take 1 tablet (40 mg total) by mouth 2 (two) times daily before a meal. 60 tablet 5   No current facility-administered medications on file prior to visit.     Allergies  Allergen Reactions  . Celexa [Citalopram Hydrobromide] Other (See Comments)    "Made me feel out of it"    Social History   Socioeconomic History  . Marital status: Married    Spouse name: Not on file  . Number of children: 4  . Years of education: 10  . Highest education level: Not on file  Occupational History     Employer: UNEMPLOYED  Social Needs  . Financial resource strain: Not on file  . Food insecurity:    Worry: Not on file    Inability: Not on file  . Transportation needs:    Medical: Not on file    Non-medical: Not on file  Tobacco Use  . Smoking status: Former Smoker    Packs/day: 0.50    Years: 10.00    Pack years: 5.00    Types: Cigarettes    Start date: 07/14/1975    Last attempt to quit: 04/04/1993    Years since quitting: 24.8  . Smokeless tobacco: Never Used  . Tobacco comment: 17 yrs ago  Substance and Sexual Activity  . Alcohol use: No  Alcohol/week: 0.0 oz  . Drug use: No  . Sexual activity: Not on file  Lifestyle  . Physical activity:    Days per week: Not on file    Minutes per session: Not on file  . Stress: Not on file  Relationships  . Social connections:    Talks on phone: Not on file    Gets together: Not on file    Attends religious service: Not on file    Active member of club or organization: Not on file    Attends meetings of clubs or organizations: Not on file    Relationship status: Not on file  . Intimate partner violence:    Fear of current or ex partner: Not on file    Emotionally abused: Not on file    Physically abused: Not on file    Forced sexual activity: Not on file  Other Topics Concern  . Not on file  Social History Narrative   Lives with husband in an apartment on the first floor.  Has 4 children.     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  Trying to get disability.  Formerly worked as a Teacher, early years/pre.  Education: 11th grade.       Pulmonary (12/23/16):   Originally from [facility]. She was raised in [place]. 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 No pets currently. No bird exposure. She does have mold in her current home in the bathroom, laundry room, & master bedroom.      Family History  Problem Relation Age of Onset  . Kidney cancer Father   . Hypertension Father   . Hyperlipidemia Father   . Sleep apnea Father   . Diabetes Mother   . Hypertension Mother   . Heart Problems Mother   . Hyperlipidemia Mother   . Asthma Mother   . Sleep apnea Mother   . Liver disease Brother   . Arthritis Brother   . Hypertension Sister   . Allergic rhinitis Sister   . Stomach cancer Unknown        aunt  . Breast cancer Maternal Grandmother   . Kidney disease Maternal Grandfather   . Breast cancer Paternal Grandmother   . Hypertension Brother   . Hyperlipidemia Brother   . Hypertension Brother   . Hypertension Sister   . Hyperlipidemia Sister   . Lupus Maternal Aunt   . Colon cancer Neg Hx   . Eczema Neg Hx   . Immunodeficiency Neg Hx   . Urticaria Neg Hx     Past Surgical History:  Procedure Laterality Date  . ABDOMINAL HYSTERECTOMY    . ABDOMINAL HYSTERECTOMY  02/18/2000  . CARDIAC CATHETERIZATION  08/18/2003   normal L main/LAD/L Cfx/RCA (Dr. Adora Fridge)  . COLONOSCOPY  2006   Dr. [name], hyperplastic polyps  . COLONOSCOPY  08/06/11   abnormal terminal ileum for 10cm, erosions, geographical ulceration. Bx small bowel mucosa with prominent intramucosal lymphoid aggregates, slightly inflammed  . DIRECT LARYNGOSCOPY  03/15/2012   Procedure: DIRECT LARYNGOSCOPY;  Surgeon: [name], MD;  Location: [place];  Service: ENT;  Laterality: N/A; Dr. Wolicki--:>no foreign body seen. normal esophagus to 40cm  . ESOPHAGEAL DILATION  04/17/2015   Procedure: ESOPHAGEAL DILATION;  Surgeon: [name], MD;  [facility];  Service:  Endoscopy;;  . ESOPHAGOGASTRODUODENOSCOPY  08/23/2008   QQV:ZDGL distal esophageal erosions consistent with mild erosive reflux esophagitis, otherwise unremarkable esophagus/ Tiny antral erosions of doubtful clinical significance, otherwise normal stomach, patent pylorus, normal D1 and D2  . ESOPHAGOGASTRODUODENOSCOPY   08/06/11   small hh, noncritical Schatzki's ring s/p 24 F  . ESOPHAGOGASTRODUODENOSCOPY N/A 04/17/2015   OVF:IEPPIR s/p dilation  . ESOPHAGOSCOPY  03/15/2012   Procedure: ESOPHAGOSCOPY;  Surgeon: [name], MD;  Location: [place];  Service: ENT;  Laterality: N/A;  . KNEE ARTHROSCOPY  04/27/2001  . NM MYOCAR PERF WALL MOTION  2003   negative bruce protocol exercise tress test; EF 68%; intermediate risk study due to evidence of anterior wall ischemia extending from mid-ventricle to apex  . 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 RECTOCELE REPAIR    . TRANSTHORACIC ECHOCARDIOGRAM  2003   EF normal  . VAGINAL PROLAPSE REPAIR    . VIDEO BRONCHOSCOPY Bilateral 03/08/2017   Procedure: VIDEO BRONCHOSCOPY WITHOUT FLUORO;  Surgeon: Javier Glazier, MD;  Location: [place];  Service: Cardiopulmonary;  Laterality: Bilateral;    ROS: Review of Systems Negative except as stated above PHYSICAL EXAM: BP (!) 155/81   Pulse 80   Temp 98.1 F (36.7 C) (Oral)   Resp 16   Wt (!) 328 lb 9.6 oz (149.1 kg)   SpO2 96%   BMI 49.96 kg/m   Wt Readings from Last 3 Encounters:  01/27/18 (!) 328 lb 9.6 oz (149.1 kg)  12/14/17 (!) 326 lb 6.4 oz (148.1 kg)  11/25/17 (!) 322 lb 12.8 oz (146.4 kg)  BP 138/70  Physical Exam  General appearance - alert, well appearing, and in no distress Mental status - alert, oriented to person, place, and time, normal mood, behavior, speech, dress, motor activity, and thought processes Eyes - pupils equal and reactive, extraocular eye movements intact Mouth - mucous membranes moist, pharynx normal without lesions Neck - supple, no significant adenopathy Chest - clear to auscultation, no wheezes, rales or rhonchi, symmetric air entry CVS- regular rate rhythm.  No gallops heard.  No JVD Extremities - 1+ BL LE edema  A1C 5.7/BS 109  ASSESSMENT AND PLAN: 1. Essential hypertension .  Cozaar/HCTZ.  Change to Cozaar 100 mg daily with furosemide  40 mg half to 1 tablet daily.  Stressed low-salt diet. - Brain natriuretic peptide - losartan (COZAAR) 100 MG tablet; Take 1 tablet (100 mg total) by mouth daily.  Dispense: 30 tablet; Refill: 6 - furosemide (LASIX) 40 MG tablet; 1/2 to 1 tab daily for lower extremity swelling  Dispense: 30 tablet; Refill: 6  2. Generalized OA Patient has established with [facility] and is on [place]. She still wish to be referred to rheumatology.  Referral submitted  3. Pre-diabetes Continue to encourage healthy eating and regular exercise - POCT glucose (manual entry) - POCT glycosylated hemoglobin (Hb A1C)  4. Vaginal itching - Cervicovaginal ancillary only  5. Class 3 severe obesity due to excess calories with serious comorbidity and body mass index (BMI) of 45.0 to 49.9 in adult [facility]) Continue healthy eating and regular exercise.  6. Edema of both legs - TSH - Brain natriuretic peptide  7. Dysphagia, unspecified type Followed by GI.  Recent EGD procedure.  8. Hypothyroidism, unspecified type - TSH  Patient was given the opportunity to ask questions.  Patient verbalized understanding of the plan and was able to repeat key elements of the plan.   Orders Placed  This Encounter  Procedures  . TSH  . Brain natriuretic peptide  . POCT glucose (manual entry)  . POCT glycosylated hemoglobin (Hb A1C)     Requested Prescriptions   Signed Prescriptions Disp Refills  . losartan (COZAAR) 100 MG tablet 30 tablet 6    Sig: Take 1 tablet (100 mg total) by mouth daily.  . furosemide (LASIX) 40 MG tablet 30 tablet 6    Sig: 1/2 to 1 tab daily for lower extremity swelling    Return in about 5 weeks (around 03/03/2018).  Karle Plumber, MD, FACP

## 2018-01-27 NOTE — Patient Instructions (Addendum)
Stop Losartan/HCTZ.  Start Losartan 100 mg and Furosemide 40 mg 1/2 to 1 tablet daily. Continue to limit salt in foods. Check your blood pressure 2-3 times a week.  Goal is 130/80 or lower.

## 2018-01-28 LAB — CERVICOVAGINAL ANCILLARY ONLY
Bacterial vaginitis: NEGATIVE
Candida vaginitis: NEGATIVE
Chlamydia: NEGATIVE
NEISSERIA GONORRHEA: NEGATIVE
TRICH (WINDOWPATH): NEGATIVE

## 2018-01-28 LAB — BRAIN NATRIURETIC PEPTIDE: BNP: 4.4 pg/mL (ref 0.0–100.0)

## 2018-01-28 LAB — TSH: TSH: 1.84 u[IU]/mL (ref 0.450–4.500)

## 2018-01-31 ENCOUNTER — Telehealth: Payer: Self-pay

## 2018-01-31 NOTE — Telephone Encounter (Signed)
Contacted pt to go over lab and vagina swab pt is aware of results and doesn't have any questions or concerns

## 2018-02-01 ENCOUNTER — Encounter (HOSPITAL_COMMUNITY): Payer: Self-pay | Admitting: Internal Medicine

## 2018-02-07 ENCOUNTER — Telehealth: Payer: Self-pay | Admitting: Adult Health

## 2018-02-07 NOTE — Telephone Encounter (Signed)
Called and spoke to patient. Patient reports that she has been having increased wheezing, productive cough (yellow), headaches, watery eyes, and patient thinks she has been running a fever but hasn't taken it to be sure. Patient is a former [facility] patient. Scheduled patient an acute visit with MW for 02/07/18 at 0900. Nothing further needed.

## 2018-02-08 ENCOUNTER — Encounter: Payer: Self-pay | Admitting: Internal Medicine

## 2018-02-08 ENCOUNTER — Ambulatory Visit (INDEPENDENT_AMBULATORY_CARE_PROVIDER_SITE_OTHER)
Admission: RE | Admit: 2018-02-08 | Discharge: 2018-02-08 | Disposition: A | Discharge status: Home / Self Care | Payer: Medicaid Other | Source: Ambulatory Visit | Attending: Internal Medicine | Admitting: Internal Medicine

## 2018-02-08 ENCOUNTER — Ambulatory Visit: Payer: Medicaid Other | Admitting: Internal Medicine

## 2018-02-08 VITALS — BP 122/88 | HR 103 | Ht 68.0 in | Wt 333.0 lb

## 2018-02-08 DIAGNOSIS — R05 Cough: Secondary | ICD-10-CM

## 2018-02-08 DIAGNOSIS — R058 Other specified cough: Secondary | ICD-10-CM

## 2018-02-08 DIAGNOSIS — R053 Chronic cough: Secondary | ICD-10-CM | POA: Insufficient documentation

## 2018-02-08 MED ORDER — PREDNISONE 10 MG PO TABS: ORAL_TABLET | ORAL | 0 refills | Status: DC

## 2018-02-08 MED ORDER — AZITHROMYCIN 250 MG PO TABS: ORAL_TABLET | ORAL | 0 refills | Status: DC

## 2018-02-08 MED ORDER — TRAMADOL HCL 50 MG PO TABS: 50.0000 mg | ORAL_TABLET | ORAL | 0 refills | Status: DC | PRN

## 2018-02-08 MED FILL — predniSONE 10 MG TABS: 10 | 6 days supply | Qty: 14 | Fill #0

## 2018-02-08 MED FILL — AZITHROMYCIN 250 MG TABLET: 250 | 5 days supply | Qty: 6 | Fill #0

## 2018-02-08 NOTE — Progress Notes (Signed)
Spoke with pt and notified of results per Dr. Wert. Pt verbalized understanding and denied any questions. 

## 2018-02-08 NOTE — Progress Notes (Signed)
Subjective:     Patient ID: Robin [name], female   DOB: [birth year],     MRN: [id number]  HPI  HPI: 70 yobf quit smoking in 1994 r seen previously for dyspnea, chronic allergic rhinitis and hemoptysis (felt secondary to erosions from tonsils status post bronchoscopy)  TESTs PFT 02/09/17: FVC  2.35 L (77%) FEV1 2.06 L (85%) FEV1/FVC 0.88 FEF 25-75 3.32 L (142%) negative bronchodilator response TLC 3.74 L (68%) RV 71% ERV 28% DLCO corrected 71% 08/22/15: FVC 2.32 L (119%) FEV1 1.99 L (121%) FEV1/FVC 0.85 FEF 25-75 2.59 L (118%)  6MWT 02/16/17:  Walked 126 meters / Baseline Sat 99%  On RA / Nadir Sat 99% on RA  METHACHOLINE CHALLENGE TEST (02/24/17):  Normal bronchial hyperreactivity.  IMAGING BARIUM SWALLOW/ESOPHAGRAM 04/30/17 (per radiologist):  Hiatal hernia with mild reflux. Esophageal dysmotility  CT NECK/SOFT TISSUE W/ CONTRAST 04/19/17 (per radiologist): No inflammatory change or foreign body can be seen in the pharynx.  Cervical spondylosis. 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  CT CHEST W/O 01/07/17   No parenchymal nodule, mass, or opacity appreciated. No pleural effusion or thickening. No pericardial effusion. No pathologic mediastinal adenopathy.  CTA CHEST 12/13/15   No pulmonary emboli. No pleural effusion or thickening. No pericardial effusion. No pathologic mediastinal adenopathy. No parenchymal nodule, mass, or opacification.  CARDIAC TTE (09/29/16): LV normal in size with moderate concentric hypertrophy. EF 55-60% with no regional wall motion abnormalities. Indeterminant diastolic function. LA & RA normal in size. RV normal in size and function. No aortic stenosis or regurgitation. Aortic root normal in size. No mitral stenosis or regurgitation. No significant pulmonic regurgitation. No significant tricuspid regurgitation. No pericardial effusion.  LEFT HEART CATHETERIZATION (11/13/3):  Aortic systolic pressure  294  Diastolic pressure 90  Left ventricular systolic pressure 765  End-diastolic pressure 27 left main coronary artery normal  Left anterior descending artery normal  Left circumflex coronary artery normal  Right coronary artery dominant and normal   Overall estimated ejection fraction greater than 60% without focal wall motion abnormality  MICROBIOLOGY Endobronchial/Endotracheal Brushing 03/08/17: Rare Haemophilus influenza beta lactamase positive  PATHOLOGY Endobronchial/Endotracheal Brushing 03/08/17: No malignancy. Negative for herpes & HPV.  LABS 05/28/17 BNP:  6.1 ESR:  34 TSH:  1.060 Anti-CCP:  6 RF:  <10  02/16/17 IgE: 81 RAST panel: Cockroach 0.2 / D. farinae 0.2 / D. pteronyssinus 0.24   12/23/16 INR: 1.0 PTT: 24.1 Chromatin Ab:  <0.2 Smith Ab: <0.2 DS DNA Ab:  <1 SSA:  <0.2 SSB:  <0.2 Anti-CCP:  <16 SCL-70:  <0.2 ENA RNP Ab: Centromere Ab Screen:  <0.2 Jo-1 Ab:  <0.2  05/12/16 ANA: Negative Rheumatoid factor:  <10   10/19/2017 Acute OV : Cough  Patient presents for an acute office visit.  Patient complains of 2 weeks of increased cough congestion and sinus drainage, low grade fever. Taking claritin and tessalon without much better.  Appetite is good. No n/v/d. Wheezing is getting worse. Cough and wheezing worse last 2 days.  Cough is aggravating would like refill of tessalon .  rec Zpack take as directed.  Mucinex DM Twice daily  As needed  Cough/congestion  Fluids and rest .  Tylenol As needed   Continue on Allegra and Flonase .  Follow up with Dr. Vaughan [name] and As needed        02/08/2018 acute extended ov/Robin [name] re:  acute on chronic cough x decades / establish with me Robin [name] pt) Chief Complaint  Patient  presents with  . Acute Visit    Pt c/o cough with yellow, wheezing and SOB x 3 days. She states her chest feels sore when she coughs. She has been using her albuterol inahler 3 x daily on average.   baseline = maint protonix right before  first meal then before supper, and singuliar and needing saba  3 x  Times a day but not noct then severe cough /subj  wheeze/ chest soreness 3 days prior to OV and sob with any activity    No obvious day to day or daytime variability or assoc excess/ purulent sputum or mucus plugs or hemoptysis or cp or chest tightness, subjective wheeze or overt sinus or hb symptoms. No unusual exposure hx or h/o childhood pna/ asthma or knowledge of premature birth.   Also denies any obvious fluctuation of symptoms with weather or environmental changes or other aggravating or alleviating factors except as outlined above   Current Allergies, Complete Past Medical History, Past Surgical History, Family History, and Social History were reviewed in [employer] record.  ROS  The following are not active complaints unless bolded Hoarseness, sore throat, dysphagia, dental problems, itching, sneezing,  nasal congestion or discharge of excess mucus or purulent secretions, ear ache,   fever, chills, sweats, unintended wt loss or wt gain, classically pleuritic or exertional cp,  orthopnea pnd or arm/hand swelling  or leg swelling, presyncope, palpitations, abdominal pain, anorexia, nausea, vomiting, diarrhea  or change in bowel habits or change in bladder habits, change in stools or change in urine, dysuria, hematuria,  rash, arthralgias, visual complaints, headache, numbness, weakness or ataxia or problems with walking or coordination,  change in mood or  memory.        Current Meds  Medication Sig  . acetaminophen (TYLENOL) 500 MG tablet Take 1 tablet (500 mg total) by mouth every 6 (six) hours as needed for headache. (Patient taking differently: Take 1,000 mg by mouth 2 (two) times daily. )  . albuterol (PROVENTIL HFA;VENTOLIN HFA) 108 (90 Base) MCG/ACT inhaler Inhale 2 puffs into the lungs every 6 (six) hours as needed for wheezing or shortness of breath.  Marland Kitchen amitriptyline (ELAVIL) 50 MG tablet Take  1 tablet (50 mg total) by mouth at bedtime.  Marland Kitchen amLODipine (NORVASC) 10 MG tablet Take 1 tablet (10 mg total) by mouth daily.  Marland Kitchen aspirin 81 MG tablet Take 1 tablet (81 mg total) by mouth at bedtime. Reported on 12/09/2015  . buPROPion (WELLBUTRIN XL) 300 MG 24 hr tablet Take 1 tablet (300 mg total) by mouth every morning. Reported on 12/09/2015  . calcium carbonate (TUMS - DOSED IN MG ELEMENTAL CALCIUM) 500 MG chewable tablet Chew 1 tablet by mouth daily as needed for indigestion or heartburn.  . diclofenac sodium (VOLTAREN) 1 % GEL Apply 2 g topically 4 (four) times daily.  . fluticasone (FLONASE) 50 MCG/ACT nasal spray Place 2 sprays into both nostrils daily.  . furosemide (LASIX) 40 MG tablet 1/2 to 1 tab daily for lower extremity swelling  . gabapentin (NEURONTIN) 300 MG capsule TAKE 2 CAPSULES BY MOUTH 3 TIMES DAILY.  Marland Kitchen Glucose Blood (BLOOD GLUCOSE TEST STRIPS) STRP Use as directed. One Touch.  . hydrALAZINE (APRESOLINE) 25 MG tablet TAKE 1 TABLET BY MOUTH 3 TIMES DAILY  . HYDROcodone-acetaminophen (NORCO/VICODIN) 5-325 MG tablet Take 1 tablet by mouth every 12 (twelve) hours.  Marland Kitchen ipratropium (ATROVENT) 0.06 % nasal spray Place 2 sprays into both nostrils 4 (four) times daily. (Patient taking  differently: Place 1 spray into both nostrils as needed for rhinitis. )  . levothyroxine (SYNTHROID, LEVOTHROID) 88 MCG tablet TAKE 1 TABLET BY MOUTH DAILY.  Marland Kitchen LINZESS 290 MCG CAPS capsule TAKE 1 CAPSULE BY MOUTH DAILY BEFORE BREAKFAST.  Marland Kitchen loratadine (CLARITIN) 10 MG tablet Take 1 tablet (10 mg total) by mouth daily. (Patient taking differently: Take 10 mg by mouth daily as needed for allergies. )  . losartan (COZAAR) 100 MG tablet Take 1 tablet (100 mg total) by mouth daily.  . meloxicam (MOBIC) 15 MG tablet Take 15 mg by mouth daily.  . metFORMIN (GLUCOPHAGE) 500 MG tablet TAKE 1/2 TABLET DAILY WITH BREAKFAST BY MOUTH.  . methocarbamol (ROBAXIN) 500 MG tablet Take 1 tablet (500 mg total) by mouth every 8  (eight) hours as needed for muscle spasms. (Patient taking differently: Take 500 mg by mouth at bedtime as needed for muscle spasms. )  . montelukast (SINGULAIR) 10 MG tablet Take 1 tablet (10 mg total) by mouth at bedtime.  . Multiple Vitamin (MULTIVITAMIN) tablet Take 1 tablet by mouth daily. Reported on 12/09/2015  . pantoprazole (PROTONIX) 40 MG tablet Take 1 tablet (40 mg total) by mouth 2 (two) times daily before a meal.  .  ] Omega-3 Fatty Acids (FISH OIL PO) Take 1 capsule by mouth daily.          Review of Systems     Objective:   Physical Exam amb obese hoarse bf with harsh upper airway coughing fits   Wt Readings from Last 3 Encounters:  02/08/18 (!) 333 lb (151 kg)  01/27/18 (!) 328 lb 9.6 oz (149.1 kg)  12/14/17 (!) 326 lb 6.4 oz (148.1 kg)     Vital signs reviewed - Note on arrival 02 sats  98% on RA     HEENT: nl dentition, turbinates bilaterally, and oropharynx. Nl external ear canals without cough reflex   NECK :  without JVD/Nodes/TM/ nl carotid upstrokes bilaterally   LUNGS: no acc muscle use,  Nl contour chest which is clear to A and P bilaterally without cough on insp or exp maneuvers   CV:  RRR  no s3 or murmur or increase in P2, and no edema   ABD:  Quite obese  nontender with limited inspiratory excursion I . No bruits or organomegaly appreciated, bowel sounds nl  MS:  slow gait/ ext warm without deformities, calf tenderness, cyanosis or clubbing No obvious joint restrictions   SKIN: warm and dry without lesions    NEURO:  alert, approp, nl sensorium with  no motor or cerebellar deficits apparent.         Assessment:

## 2018-02-08 NOTE — Patient Instructions (Addendum)
Prednisone 10 mg take  4 each am x 2 days,   2 each am x 2 days,  1 each am x 2 days and stop   zpak  Protonix 40  Mg Take 30- 60 min before your first and last meals of the day   For drainage / throat tickle try take CHLORPHENIRAMINE  4 mg - take one every 4 hours as needed - available over the counter- may cause drowsiness so start with just a bedtime dose or two and see how you tolerate it before trying in daytime     Take delsym two tsp every 12 hours and supplement if needed with  tramadol 50 mg up to 2 every 4 hours to suppress the urge to cough. Swallowing water and/or using ice chips/non mint and menthol containing candies (such as lifesavers or sugarless jolly ranchers) are also effective.  You should rest your voice and avoid activities that you know make you cough.  Once you have eliminated the cough for 3 straight days try reducing the tramadol first,  then the delsym as tolerated.   GERD (REFLUX)  is an extremely common cause of respiratory symptoms just like yours , many times with no obvious heartburn at all.    It can be treated with medication, but also with lifestyle changes including elevation of the head of your bed (ideally with 6 inch  bed blocks),  Smoking cessation, avoidance of late meals, excessive alcohol, and avoid fatty foods, chocolate, peppermint, colas, red wine, and acidic juices such as orange juice.  NO MINT OR MENTHOL PRODUCTS SO NO COUGH DROPS  USE SUGARLESS CANDY INSTEAD (Jolley ranchers or Stover's or Life Savers) or even ice chips will also do - the key is to swallow to prevent all throat clearing. NO OIL BASED VITAMINS - use powdered substitutes.    Please schedule a follow up office visit in 4 weeks, sooner if needed  with all medications /inhalers/ solutions in hand so we can verify exactly what you are taking. This includes all medications from all doctors and over the counters

## 2018-02-10 ENCOUNTER — Encounter: Payer: Self-pay | Admitting: Internal Medicine

## 2018-02-10 NOTE — Assessment & Plan Note (Signed)
Allergy profile 02/16/17  >  Eos 0. /  IgE  81 pos dust/ cockroach  - MCT 6/38/75 neg  - Cyclical cough protocol 02/08/2018   Upper airway cough syndrome (previously labeled PNDS),  is so named because it's frequently impossible to sort out how much is  CR/sinusitis with freq throat clearing (which can be related to primary GERD)   vs  causing  secondary (" extra esophageal")  GERD from wide swings in gastric pressure that occur with throat clearing, often  promoting self use of mint and menthol lozenges that reduce the lower esophageal sphincter tone and exacerbate the problem further in a cyclical fashion.   These are the same pts (now being labeled as having "irritable larynx syndrome" by some cough centers) who not infrequently have a history of having failed to tolerate ace inhibitors,  dry powder inhalers or biphosphonates or report having atypical/extraesophageal reflux symptoms that don't respond to standard doses of PPI  and are easily confused as having aecopd or asthma flares by even experienced allergists/ pulmonologists (myself included).   Of the three most common causes of  Sub-acute / recurrent or chronic cough, only one (GERD)  can actually contribute to/ trigger  the other two (asthma and post nasal drip syndrome)  and perpetuate the cylce of cough.  While not intuitively obvious, many patients with chronic low grade reflux do not cough until there is a primary insult that disturbs the protective epithelial barrier and exposes sensitive nerve endings.   This is typically viral but can due to PNDS and  either may apply here.   The point is that once this occurs, it is difficult to eliminate the cycle  using anything but a maximally effective acid suppression regimen at least in the short run, accompanied by an appropriate diet to address non acid GERD and control / eliminate the cough itself for at least 3 days with tramadol.   I had an extended discussion with the patient reviewing all  relevant studies completed to date and  lasting 25 minutes of a 40  minute acute office visit with pt new to me     re  severe non-specific but potentially very serious refractory respiratory symptoms of uncertain and potentially multiple  etiologies.   Each maintenance medication was reviewed in detail including most importantly the difference between maintenance and prns and under what circumstances the prns are to be triggered using an action plan format that is not reflected in the computer generated alphabetically organized AVS.    Please see AVS for specific instructions unique to this office visit that I personally wrote and verbalized to the the pt in detail and then reviewed with pt  by my nurse highlighting any changes in therapy/plan of care  recommended at today's visit.      Needs to return in 4 weeks with all meds in hand using a trust but verify approach to confirm accurate Medication  Reconciliation The principal here is that until we are certain that the  patients are doing what we've asked, it makes no sense to ask them to do more.

## 2018-02-10 NOTE — Assessment & Plan Note (Signed)
Body mass index is 50.63 kg/m.  -  trending up Lab Results  Component Value Date   TSH 1.840 01/27/2018     Contributing to gerd risk/ doe/reviewed the need and the process to achieve and maintain neg calorie balance > defer f/u primary care including intermittently monitoring thyroid status

## 2018-02-14 ENCOUNTER — Other Ambulatory Visit: Payer: Self-pay | Admitting: Internal Medicine

## 2018-02-14 MED FILL — MONTELUKAST SOD 10 MG TAB: 10 | 30 days supply | Qty: 30 | Fill #7

## 2018-02-14 MED FILL — PANTOPRAZOLE SOD DR 40 MG T: 40 | 30 days supply | Qty: 60 | Fill #2

## 2018-02-14 MED FILL — metFORMIN HCL 500 MG TABS: 500 | 30 days supply | Qty: 15 | Fill #0

## 2018-02-14 MED FILL — AMITRIPTYLINE HCL 50 MG TAB: 50 | 30 days supply | Qty: 30 | Fill #5

## 2018-02-21 ENCOUNTER — Other Ambulatory Visit: Payer: Self-pay | Admitting: Internal Medicine

## 2018-02-21 MED FILL — LOSARTAN POTASSIUM 100 MG T: 100 | 30 days supply | Qty: 30 | Fill #1

## 2018-02-21 MED FILL — hydrALAZINE HCL 25 MG TABS: 25 | 30 days supply | Qty: 90 | Fill #1

## 2018-02-21 MED FILL — BUPROPION HCL XL 300 MG TAB: 300 | 30 days supply | Qty: 30 | Fill #4

## 2018-02-21 MED FILL — LEVOTHYROXINE 88 MCG TABLET: 88 | 90 days supply | Qty: 90 | Fill #0

## 2018-02-21 MED FILL — AMLODIPINE BESYLATE 10 MG T: 10 | 30 days supply | Qty: 30 | Fill #4

## 2018-02-24 ENCOUNTER — Other Ambulatory Visit: Payer: Self-pay | Admitting: Internal Medicine

## 2018-02-24 DIAGNOSIS — M797 Fibromyalgia: Secondary | ICD-10-CM

## 2018-02-24 MED FILL — FUROSEMIDE 40 MG TAB: 40 | 30 days supply | Qty: 30 | Fill #1

## 2018-02-24 MED FILL — GABAPENTIN 300 MG CAPSULE: 300 | 30 days supply | Qty: 180 | Fill #0

## 2018-03-01 MED FILL — LINZESS 290 MCG CAPSULE: 290 | 30 days supply | Qty: 30 | Fill #4

## 2018-03-04 ENCOUNTER — Telehealth: Payer: Self-pay

## 2018-03-04 ENCOUNTER — Ambulatory Visit: Payer: Medicaid Other | Attending: Internal Medicine | Admitting: Internal Medicine

## 2018-03-04 ENCOUNTER — Encounter: Payer: Self-pay | Admitting: Internal Medicine

## 2018-03-04 VITALS — BP 133/81 | HR 94 | Temp 98.5°F | Resp 16 | Wt 337.2 lb

## 2018-03-04 DIAGNOSIS — F329 Major depressive disorder, single episode, unspecified: Secondary | ICD-10-CM | POA: Insufficient documentation

## 2018-03-04 DIAGNOSIS — Z79890 Hormone replacement therapy (postmenopausal): Secondary | ICD-10-CM | POA: Insufficient documentation

## 2018-03-04 DIAGNOSIS — E739 Lactose intolerance, unspecified: Secondary | ICD-10-CM | POA: Insufficient documentation

## 2018-03-04 DIAGNOSIS — Z791 Long term (current) use of non-steroidal anti-inflammatories (NSAID): Secondary | ICD-10-CM | POA: Insufficient documentation

## 2018-03-04 DIAGNOSIS — E66813 Obesity, class 3: Secondary | ICD-10-CM

## 2018-03-04 DIAGNOSIS — K219 Gastro-esophageal reflux disease without esophagitis: Secondary | ICD-10-CM | POA: Insufficient documentation

## 2018-03-04 DIAGNOSIS — R7303 Prediabetes: Secondary | ICD-10-CM | POA: Diagnosis not present

## 2018-03-04 DIAGNOSIS — M170 Bilateral primary osteoarthritis of knee: Secondary | ICD-10-CM | POA: Insufficient documentation

## 2018-03-04 DIAGNOSIS — Z8249 Family history of ischemic heart disease and other diseases of the circulatory system: Secondary | ICD-10-CM | POA: Insufficient documentation

## 2018-03-04 DIAGNOSIS — M19012 Primary osteoarthritis, left shoulder: Secondary | ICD-10-CM | POA: Insufficient documentation

## 2018-03-04 DIAGNOSIS — M797 Fibromyalgia: Secondary | ICD-10-CM | POA: Insufficient documentation

## 2018-03-04 DIAGNOSIS — Z87891 Personal history of nicotine dependence: Secondary | ICD-10-CM | POA: Insufficient documentation

## 2018-03-04 DIAGNOSIS — M19042 Primary osteoarthritis, left hand: Secondary | ICD-10-CM | POA: Insufficient documentation

## 2018-03-04 DIAGNOSIS — L602 Onychogryphosis: Secondary | ICD-10-CM

## 2018-03-04 DIAGNOSIS — E063 Autoimmune thyroiditis: Secondary | ICD-10-CM | POA: Insufficient documentation

## 2018-03-04 DIAGNOSIS — I10 Essential (primary) hypertension: Secondary | ICD-10-CM | POA: Diagnosis not present

## 2018-03-04 DIAGNOSIS — R600 Localized edema: Secondary | ICD-10-CM | POA: Diagnosis not present

## 2018-03-04 DIAGNOSIS — Z6841 Body Mass Index (BMI) 40.0 and over, adult: Secondary | ICD-10-CM | POA: Insufficient documentation

## 2018-03-04 DIAGNOSIS — M19041 Primary osteoarthritis, right hand: Secondary | ICD-10-CM | POA: Diagnosis not present

## 2018-03-04 DIAGNOSIS — Z883 Allergy status to other anti-infective agents status: Secondary | ICD-10-CM | POA: Insufficient documentation

## 2018-03-04 DIAGNOSIS — Z7982 Long term (current) use of aspirin: Secondary | ICD-10-CM | POA: Insufficient documentation

## 2018-03-04 DIAGNOSIS — Z7984 Long term (current) use of oral hypoglycemic drugs: Secondary | ICD-10-CM | POA: Diagnosis not present

## 2018-03-04 DIAGNOSIS — M19011 Primary osteoarthritis, right shoulder: Secondary | ICD-10-CM | POA: Insufficient documentation

## 2018-03-04 DIAGNOSIS — G4733 Obstructive sleep apnea (adult) (pediatric): Secondary | ICD-10-CM | POA: Insufficient documentation

## 2018-03-04 DIAGNOSIS — L608 Other nail disorders: Secondary | ICD-10-CM | POA: Insufficient documentation

## 2018-03-04 DIAGNOSIS — Z79899 Other long term (current) drug therapy: Secondary | ICD-10-CM | POA: Insufficient documentation

## 2018-03-04 LAB — GLUCOSE, POCT (MANUAL RESULT ENTRY): POC Glucose: 110 mg/dl — AB (ref 70–99)

## 2018-03-04 MED ORDER — CARVEDILOL 3.125 MG PO TABS: 3.1250 mg | ORAL_TABLET | Freq: Two times a day (BID) | ORAL | 3 refills | Status: DC

## 2018-03-04 MED FILL — CARVEDILOL 3.125 MG TABLET: 3.125 | 30 days supply | Qty: 60 | Fill #0

## 2018-03-04 NOTE — Progress Notes (Signed)
Patient ID: Robin [name], female    DOB: 11/25/1955  MRN: [id number]  CC: Follow-up   Subjective: Robin [name] is a 62 y.o. female who presents for 1 mth f/u on LE edema Her concerns today include:  Patient with history of HTN, pre-DM, OSA, fibromyalgia, depression, obesity, GERD, hypothyroid, OA of multiple js (hands, knees, shoulders)  No change in swelling with Furosemide added last visit Swelling decreases when she is off her feet No CP/PND/orthopnea.  BNP was neg.  TSH nl. She had echo done in 2017 that revealed normal EF with moderate concentric hypertrophy and no wall motion abnormality; diastolic function was indeterminate  She is requesting a referral to see a podiatrist to have toenails clipped. The toenail on the right big toe is particularly overgrown and digs into the flesh  She is also requesting a referral to a surgeon to be considered for weight reduction surgery. She has been seeing a nutritionist trying to lose weight without much success.  Patient Active Problem List   Diagnosis Date Noted  . Upper airway cough syndrome 02/08/2018  . Dysphagia 12/14/2017  . Acute bronchitis 10/19/2017  . Immunization due 07/30/2017  . 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 Early satiety 06/16/2017  . Nausea without vomiting 06/16/2017  . Hx of iron deficiency anemia 05/28/2017  . Prediabetes 02/04/2017  . Chronic allergic rhinitis 12/23/2016  . Hemoptysis 12/23/2016  . Fibromyalgia 08/18/2016  . Chronic lymphocytic thyroiditis 08/18/2016  . Depression 04/01/2016  . OSA (obstructive sleep apnea) 04/01/2016  . HTN (hypertension), benign 12/09/2015  . Hypothyroidism 12/09/2015  . Morbid obesity due to excess calories ([place]) 12/09/2015  . Constipation 05/27/2015  . Fatty liver 05/27/2015  . Abnormal small bowel biopsy 09/18/2011  . Lactose intolerance 09/18/2011  . GERD 01/21/2010     Current Outpatient Medications on File Prior to Visit  Medication Sig Dispense Refill  .  acetaminophen (TYLENOL) 500 MG tablet Take 1 tablet (500 mg total) by mouth every 6 (six) hours as needed for headache. (Patient taking differently: Take 1,000 mg by mouth 2 (two) times daily. ) 30 tablet 0  . albuterol (PROVENTIL HFA;VENTOLIN HFA) 108 (90 Base) MCG/ACT inhaler Inhale 2 puffs into the lungs every 6 (six) hours as needed for wheezing or shortness of breath. 1 Inhaler 2  . amitriptyline (ELAVIL) 50 MG tablet Take 1 tablet (50 mg total) by mouth at bedtime. 60 tablet 3  . aspirin 81 MG tablet Take 1 tablet (81 mg total) by mouth at bedtime. Reported on 12/09/2015 90 tablet 3  . azithromycin (ZITHROMAX) 250 MG tablet Take 2 on day one then 1 daily x 4 days 6 tablet 0  . buPROPion (WELLBUTRIN XL) 300 MG 24 hr tablet Take 1 tablet (300 mg total) by mouth every morning. Reported on 12/09/2015 90 tablet 3  . calcium carbonate (TUMS - DOSED IN MG ELEMENTAL CALCIUM) 500 MG chewable tablet Chew 1 tablet by mouth daily as needed for indigestion or heartburn.    . diclofenac sodium (VOLTAREN) 1 % GEL Apply 2 g topically 4 (four) times daily. 100 g 2  . fluticasone (FLONASE) 50 MCG/ACT nasal spray Place 2 sprays into both nostrils daily. 16 g 6  . furosemide (LASIX) 40 MG tablet 1/2 to 1 tab daily for lower extremity swelling 30 tablet 6  . gabapentin (NEURONTIN) 300 MG capsule TAKE 2 CAPSULES BY MOUTH 3 TIMES DAILY. 180 capsule 1  . Glucose Blood (BLOOD GLUCOSE TEST STRIPS) STRP Use as directed. One  Touch. 100 each 1  . hydrALAZINE (APRESOLINE) 25 MG tablet TAKE 1 TABLET BY MOUTH 3 TIMES DAILY 90 tablet 2  . HYDROcodone-acetaminophen (NORCO/VICODIN) 5-325 MG tablet Take 1 tablet by mouth every 12 (twelve) hours.    Marland Kitchen ipratropium (ATROVENT) 0.06 % nasal spray Place 2 sprays into both nostrils 4 (four) times daily. (Patient taking differently: Place 1 spray into both nostrils as needed for rhinitis. ) 15 mL 1  . levothyroxine (SYNTHROID, LEVOTHROID) 88 MCG tablet TAKE 1 TABLET BY MOUTH DAILY. 90 tablet  0  . LINZESS 290 MCG CAPS capsule TAKE 1 CAPSULE BY MOUTH DAILY BEFORE BREAKFAST. 30 capsule 5  . loratadine (CLARITIN) 10 MG tablet Take 1 tablet (10 mg total) by mouth daily. (Patient taking differently: Take 10 mg by mouth daily as needed for allergies. ) 30 tablet 11  . losartan (COZAAR) 100 MG tablet Take 1 tablet (100 mg total) by mouth daily. 30 tablet 6  . meloxicam (MOBIC) 15 MG tablet Take 15 mg by mouth daily.    . metFORMIN (GLUCOPHAGE) 500 MG tablet TAKE 1/2 TABLET DAILY WITH BREAKFAST BY MOUTH. 45 tablet 0  . methocarbamol (ROBAXIN) 500 MG tablet Take 1 tablet (500 mg total) by mouth every 8 (eight) hours as needed for muscle spasms. (Patient taking differently: Take 500 mg by mouth at bedtime as needed for muscle spasms. ) 60 tablet 1  . montelukast (SINGULAIR) 10 MG tablet Take 1 tablet (10 mg total) by mouth at bedtime. 30 tablet 11  . Multiple Vitamin (MULTIVITAMIN) tablet Take 1 tablet by mouth daily. Reported on 12/09/2015 90 tablet 1  . pantoprazole (PROTONIX) 40 MG tablet Take 1 tablet (40 mg total) by mouth 2 (two) times daily before a meal. 60 tablet 5  . predniSONE (DELTASONE) 10 MG tablet Take  4 each am x 2 days,   2 each am x 2 days,  1 each am x 2 days and stop 14 tablet 0  . traMADol (ULTRAM) 50 MG tablet Take 1 tablet (50 mg total) by mouth every 4 (four) hours as needed (for cough or pain). 40 tablet 0   No current facility-administered medications on file prior to visit.     Allergies  Allergen Reactions  . Celexa [Citalopram Hydrobromide] Other (See Comments)    "Made me feel out of it"  . Diflucan [Fluconazole] Rash    Social History   Socioeconomic History  . Marital status: Married    Spouse name: Not on file  . Number of children: 4  . Years of education: 10  . Highest education level: Not on file  Occupational History    Employer: UNEMPLOYED  Social Needs  . Financial resource strain: Not on file  . Food insecurity:    Worry: Not on file     Inability: Not on file  . Transportation needs:    Medical: Not on file    Non-medical: Not on file  Tobacco Use  . Smoking status: Former Smoker    Packs/day: 0.50    Years: 10.00    Pack years: 5.00    Types: Cigarettes    Start date: 07/14/1975    Last attempt to quit: 04/04/1993    Years since quitting: 24.9  . Smokeless tobacco: Never Used  . Tobacco comment: 17 yrs ago  Substance and Sexual Activity  . Alcohol use: No    Alcohol/week: 0.0 oz  . Drug use: No  . Sexual activity: Not on file  Lifestyle  .  Physical activity:    Days per week: Not on file    Minutes per session: Not on file  . Stress: Not on file  Relationships  . Social connections:    Talks on phone: Not on file    Gets together: Not on file    Attends religious service: Not on file    Active member of club or organization: Not on file    Attends meetings of clubs or organizations: Not on file    Relationship status: Not on file  . Intimate partner violence:    Fear of current or ex partner: Not on file    Emotionally abused: Not on file    Physically abused: Not on file    Forced sexual activity: Not on file  Other Topics Concern  . Not on file  Social History Narrative   Lives with husband in an apartment on the first floor.  Has 4 children.     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  Trying to get disability.  Formerly worked as a Teacher, early years/pre.  Education: 11th grade.      [facility] (12/23/16):   Originally from [facility]. She was raised in [place]. 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 No pets currently. No bird exposure. She does have mold in her current home in the bathroom, laundry room, & master bedroom.     Family History  Problem Relation Age of Onset  . Kidney cancer Father   . Hypertension Father   . Hyperlipidemia Father    . Sleep apnea Father   . Diabetes Mother   . Hypertension Mother   . Heart Problems Mother   . Hyperlipidemia Mother   . Asthma Mother   . Sleep apnea Mother   . Liver disease Brother   . Arthritis Brother   . Hypertension Sister   . Allergic rhinitis Sister   . Stomach cancer Unknown        aunt  . Breast cancer Maternal Grandmother   . Kidney disease Maternal Grandfather   . Breast cancer Paternal Grandmother   . Hypertension Brother   . Hyperlipidemia Brother   . Hypertension Brother   . Hypertension Sister   . Hyperlipidemia Sister   . Lupus Maternal Aunt   . Colon cancer Neg Hx   . Eczema Neg Hx   . Immunodeficiency Neg Hx   . Urticaria Neg Hx     Past Surgical History:  Procedure Laterality Date  . ABDOMINAL HYSTERECTOMY    . ABDOMINAL HYSTERECTOMY  02/18/2000  . CARDIAC CATHETERIZATION  08/18/2003   normal L main/LAD/L Cfx/RCA (Dr. Adora Fridge)  . COLONOSCOPY  2006   Dr. [name], hyperplastic polyps  . COLONOSCOPY  08/06/11   abnormal terminal ileum for 10cm, erosions, geographical ulceration. Bx small bowel mucosa with prominent intramucosal lymphoid aggregates, slightly inflammed  . DIRECT LARYNGOSCOPY  03/15/2012   Procedure: DIRECT LARYNGOSCOPY;  Surgeon: [name], MD;  Location: [place];  Service: ENT;  Laterality: N/A; Dr. Wolicki--:>no foreign body seen. normal esophagus to 40cm  . ESOPHAGEAL DILATION  04/17/2015   Procedure: ESOPHAGEAL DILATION;  Surgeon: [name], MD;  [facility];  Service: Endoscopy;;  . ESOPHAGOGASTRODUODENOSCOPY  08/23/2008   GDJ:MEQA distal esophageal erosions consistent with mild erosive reflux esophagitis, otherwise  unremarkable esophagus/ Tiny antral erosions of doubtful clinical significance, otherwise normal stomach, patent pylorus, normal D1 and D2  . ESOPHAGOGASTRODUODENOSCOPY  08/06/11   small hh, noncritical Schatzki's ring s/p 78 F  . ESOPHAGOGASTRODUODENOSCOPY N/A 04/17/2015   DDU:KGURKY s/p dilation   . ESOPHAGOGASTRODUODENOSCOPY (EGD) WITH PROPOFOL N/A 01/20/2018   Procedure: ESOPHAGOGASTRODUODENOSCOPY (EGD) WITH PROPOFOL;  Surgeon: [name], MD;  [facility];  Service: Endoscopy;  Laterality: N/A;  9:00am  . ESOPHAGOSCOPY  03/15/2012   Procedure: ESOPHAGOSCOPY;  Surgeon: [name], MD;  Location: [place];  Service: ENT;  Laterality: N/A;  . KNEE ARTHROSCOPY  04/27/2001  . MALONEY DILATION N/A 01/20/2018   Procedure: Venia Minks DILATION;  Surgeon: [name], MD;  [facility];  Service: Endoscopy;  Laterality: N/A;  . [place]  2003   negative bruce protocol exercise tress test; EF 68%; intermediate risk study due to evidence of anterior wall ischemia extending from mid-ventricle to apex  . 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 RECTOCELE REPAIR    . TRANSTHORACIC ECHOCARDIOGRAM  2003   EF normal  . VAGINAL PROLAPSE REPAIR    . VIDEO BRONCHOSCOPY Bilateral 03/08/2017   Procedure: VIDEO BRONCHOSCOPY WITHOUT FLUORO;  Surgeon: Javier Glazier, MD;  Location: [place];  Service: Cardiopulmonary;  Laterality: Bilateral;    ROS: Review of Systems Negative except as stated above PHYSICAL EXAM: BP 133/81   Pulse 94   Temp 98.5 F (36.9 C) (Oral)   Resp 16   Wt (!) 337 lb 3.2 oz (153 kg)   SpO2 97%   BMI 51.27 kg/m   Wt Readings from Last 3 Encounters:  03/04/18 (!) 337 lb 3.2 oz (153 kg)  02/08/18 (!) 333 lb (151 kg)  01/27/18 (!) 328 lb 9.6 oz (149.1 kg)    Physical Exam  General appearance - alert, well appearing, and in no distress Mental status - normal mood, behavior, speech, dress, motor activity, and thought processes Chest - clear to auscultation, no wheezes, rales or rhonchi, symmetric air entry Heart - normal rate, regular rhythm, normal S1, S2, no murmurs, rubs, clicks or gallops Extremities - 1+ slightly pitting edema Nails: Toenail on the right big toe is overgrown. She also has some  overgrown nails on toes of the left foot.  Results for orders placed or performed in visit on 03/04/18  POCT glucose (manual entry)  Result Value Ref Range   POC Glucose 110 (A) 70 - 99 mg/dl    ASSESSMENT AND PLAN: 1. Edema of both legs -Exam, history and lab findings do not point in the direction of CHF. She is on several medications that can cause swelling including amlodipine, gabapentin and hydralazine. She has been on these for a while. Other diagnoses in the differential is venous stasis -Patient is agreeable to trying to things. First we will stop amlodipine and put her on carvedilol instead. She will continue to limit salt in the foods. Second she will use compression stockings/socks during the day. Follow-up in 2 weeks - Comprehensive metabolic panel  2. Prediabetes - POCT glucose (manual entry)  3. Overgrown toenails - Ambulatory referral to Podiatry  4. Class 3 severe obesity due to excess calories with serious comorbidity and body mass index (BMI) of 50.0 to 59.9 in adult [facility]) - Ambulatory referral to General Surgery   Patient was given the opportunity to ask questions.  Patient verbalized understanding of the plan and was  able to repeat key elements of the plan.   Orders Placed This Encounter  Procedures  . Comprehensive metabolic panel  . Ambulatory referral to Podiatry  . Ambulatory referral to General Surgery  . POCT glucose (manual entry)     Requested Prescriptions   Signed Prescriptions Disp Refills  . carvedilol (COREG) 3.125 MG tablet 60 tablet 3    Sig: Take 1 tablet (3.125 mg total) by mouth 2 (two) times daily with a meal.    Return for 2-3 weeks f/u for edema in legs.  Karle Plumber, MD, FACP

## 2018-03-04 NOTE — Telephone Encounter (Signed)
CMA call patient regarding she came in stating that her pain management Dr wants her to stop a medication   PCP stated she can stop it but if the Dr needs something from the pcp for them to fax a request form   Patient was aware and understood

## 2018-03-04 NOTE — Patient Instructions (Signed)
Stop Amlodipine.  Start Carvedilol instead.

## 2018-03-05 LAB — COMPREHENSIVE METABOLIC PANEL
A/G RATIO: 1.6 (ref 1.2–2.2)
ALBUMIN: 4.2 g/dL (ref 3.5–5.5)
ALT: 20 IU/L (ref 0–32)
AST: 18 IU/L (ref 0–40)
Alkaline Phosphatase: 81 IU/L (ref 39–117)
BUN/Creatinine Ratio: 13 (ref 9–23)
BUN: 10 mg/dL (ref 6–24)
Bilirubin Total: 0.3 mg/dL (ref 0.0–1.2)
CALCIUM: 9.6 mg/dL (ref 8.7–10.2)
CO2: 24 mmol/L (ref 20–29)
CREATININE: 0.78 mg/dL (ref 0.57–1.00)
Chloride: 102 mmol/L (ref 96–106)
GFR, EST AFRICAN AMERICAN: 96 mL/min/{1.73_m2} (ref >59)
GFR, EST NON AFRICAN AMERICAN: 83 mL/min/{1.73_m2} (ref >59)
GLOBULIN, TOTAL: 2.7 g/dL (ref 1.5–4.5)
Glucose: 113 mg/dL — ABNORMAL HIGH (ref 65–99)
POTASSIUM: 4.4 mmol/L (ref 3.5–5.2)
SODIUM: 143 mmol/L (ref 134–144)
TOTAL PROTEIN: 6.9 g/dL (ref 6.0–8.5)

## 2018-03-08 ENCOUNTER — Other Ambulatory Visit (INDEPENDENT_AMBULATORY_CARE_PROVIDER_SITE_OTHER): Payer: Medicaid Other

## 2018-03-08 ENCOUNTER — Ambulatory Visit (INDEPENDENT_AMBULATORY_CARE_PROVIDER_SITE_OTHER): Payer: Medicaid Other | Admitting: Internal Medicine

## 2018-03-08 ENCOUNTER — Encounter: Payer: Self-pay | Admitting: Internal Medicine

## 2018-03-08 VITALS — BP 140/78 | HR 90 | Ht 68.0 in | Wt 337.6 lb

## 2018-03-08 DIAGNOSIS — I10 Essential (primary) hypertension: Secondary | ICD-10-CM

## 2018-03-08 DIAGNOSIS — R0609 Other forms of dyspnea: Secondary | ICD-10-CM

## 2018-03-08 DIAGNOSIS — R05 Cough: Secondary | ICD-10-CM | POA: Diagnosis not present

## 2018-03-08 DIAGNOSIS — R058 Other specified cough: Secondary | ICD-10-CM

## 2018-03-08 DIAGNOSIS — R059 Cough, unspecified: Secondary | ICD-10-CM

## 2018-03-08 LAB — TSH: TSH: 2.29 u[IU]/mL (ref 0.35–4.50)

## 2018-03-08 LAB — CBC WITH DIFFERENTIAL/PLATELET
Basophils Absolute: 0.1 10*3/uL (ref 0.0–0.1)
Basophils Relative: 0.9 % (ref 0.0–3.0)
EOS ABS: 0.1 10*3/uL (ref 0.0–0.7)
EOS PCT: 1.8 % (ref 0.0–5.0)
HCT: 36.9 % (ref 36.0–46.0)
Hemoglobin: 11.7 g/dL — ABNORMAL LOW (ref 12.0–15.0)
LYMPHS ABS: 3.4 10*3/uL (ref 0.7–4.0)
Lymphocytes Relative: 50 % — ABNORMAL HIGH (ref 12.0–46.0)
MCHC: 31.6 g/dL (ref 30.0–36.0)
MCV: 87.4 fl (ref 78.0–100.0)
Monocytes Absolute: 1 10*3/uL (ref 0.1–1.0)
Monocytes Relative: 15.4 % — ABNORMAL HIGH (ref 3.0–12.0)
NEUTROS PCT: 31.9 % — AB (ref 43.0–77.0)
Neutro Abs: 2.2 10*3/uL (ref 1.4–7.7)
Platelets: 362 10*3/uL (ref 150.0–400.0)
RBC: 4.23 Mil/uL (ref 3.87–5.11)
RDW: 14.2 % (ref 11.5–15.5)
WBC: 6.8 10*3/uL (ref 4.0–10.5)

## 2018-03-08 LAB — BASIC METABOLIC PANEL
BUN: 10 mg/dL (ref 6–23)
CALCIUM: 9.5 mg/dL (ref 8.4–10.5)
CHLORIDE: 103 meq/L (ref 96–112)
CO2: 33 mEq/L — ABNORMAL HIGH (ref 19–32)
CREATININE: 0.85 mg/dL (ref 0.40–1.20)
GFR: 87.84 mL/min (ref >60.00)
Glucose, Bld: 109 mg/dL — ABNORMAL HIGH (ref 70–99)
Potassium: 4.3 mEq/L (ref 3.5–5.1)
Sodium: 141 mEq/L (ref 135–145)

## 2018-03-08 LAB — BRAIN NATRIURETIC PEPTIDE: PRO B NATRI PEPTIDE: 25 pg/mL (ref 0.0–100.0)

## 2018-03-08 MED ORDER — BISOPROLOL FUMARATE 5 MG PO TABS: 5.0000 mg | ORAL_TABLET | Freq: Every day | ORAL | 11 refills | Status: DC

## 2018-03-08 MED ORDER — BENZONATATE 200 MG PO CAPS: 200.0000 mg | ORAL_CAPSULE | Freq: Three times a day (TID) | ORAL | 1 refills | Status: DC | PRN

## 2018-03-08 NOTE — Patient Instructions (Addendum)
Stop corevidol and losartan and instead try bisoprolol 5 mg twice daily   For cough try tessalon 200 mg every 8 hours as needed   For drainage / throat tickle try take CHLORPHENIRAMINE  4 mg - take one every 4 hours as needed - available over the counter- may cause drowsiness so start with just a bedtime dose or two and see how you tolerate it before trying in daytime    Please see patient coordinator before you leave today  to schedule sinus CT  Please remember to go to the lab department downstairs in the basement  for your tests - we will call you with the results when they are available.   See Tammy NP in 4 weeks with all your medications, even over the counter meds, separated in two separate bags, the ones you take no matter what vs the ones you stop once you feel better and take only as needed when you feel you need them.   Tammy  will generate for you a new user friendly medication calendar that will put [name] all on the same page re: your medication use.     Without this process, it simply isn't possible to assure that we are providing  your outpatient care  with  the attention to detail we feel you deserve.   If we cannot assure that you're getting that kind of care,  then we cannot manage your problem effectively from this clinic.  Once you have seen Tammy and we are sure that we're all on the same page with your medication use she will arrange follow up with me.

## 2018-03-08 NOTE — Progress Notes (Signed)
Spoke with pt and notified of results per Dr. Wert. Pt verbalized understanding and denied any questions. 

## 2018-03-08 NOTE — Progress Notes (Signed)
Subjective:     Patient ID: Robin [name], female   DOB: [birth year],     MRN: [id number]  HPI  HPI: 65 yobf quit smoking in 1994 r seen previously for dyspnea, chronic allergic rhinitis and hemoptysis (felt secondary to erosions from tonsils status post bronchoscopy)  TESTS  PFT 02/09/17: FVC  2.35 L (77%) FEV1 2.06 L (85%) FEV1/FVC 0.88 FEF 25-75 3.32 L (142%) negative bronchodilator response TLC 3.74 L (68%) RV 71% ERV 28% DLCO corrected 71% 08/22/15: FVC 2.32 L (119%) FEV1 1.99 L (121%) FEV1/FVC 0.85 FEF 25-75 2.59 L (118%)  6MWT 02/16/17:  Walked 126 meters / Baseline Sat 99%  On RA / Nadir Sat 99% on RA  METHACHOLINE CHALLENGE TEST (02/24/17):  Normal bronchial hyperreactivity.  IMAGING BARIUM SWALLOW/ESOPHAGRAM 04/30/17 (per radiologist):  Hiatal hernia with mild reflux. Esophageal dysmotility  CT NECK/SOFT TISSUE W/ CONTRAST 04/19/17 (per radiologist): No inflammatory change or foreign body can be seen in the pharynx.  Cervical spondylosis. 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  CT CHEST W/O 01/07/17   No parenchymal nodule, mass, or opacity appreciated. No pleural effusion or thickening. No pericardial effusion. No pathologic mediastinal adenopathy.  CTA CHEST 12/13/15   No pulmonary emboli. No pleural effusion or thickening. No pericardial effusion. No pathologic mediastinal adenopathy. No parenchymal nodule, mass, or opacification.  CARDIAC TTE (09/29/16): LV normal in size with moderate concentric hypertrophy. EF 55-60% with no regional wall motion abnormalities. Indeterminant diastolic function. LA & RA normal in size. RV normal in size and function. No aortic stenosis or regurgitation. Aortic root normal in size. No mitral stenosis or regurgitation. No significant pulmonic regurgitation. No significant tricuspid regurgitation. No pericardial effusion.  LEFT HEART CATHETERIZATION (11/13/3):  Aortic systolic pressure  259  Diastolic pressure 90  Left ventricular systolic pressure 563  End-diastolic pressure 27 left main coronary artery normal  Left anterior descending artery normal  Left circumflex coronary artery normal  Right coronary artery dominant and normal   Overall estimated ejection fraction greater than 60% without focal wall motion abnormality  MICROBIOLOGY Endobronchial/Endotracheal Brushing 03/08/17: Rare Haemophilus influenza beta lactamase positive  PATHOLOGY Endobronchial/Endotracheal Brushing 03/08/17: No malignancy. Negative for herpes & HPV.  LABS 05/28/17 BNP:  6.1 ESR:  34 TSH:  1.060 Anti-CCP:  6 RF:  <10  02/16/17 IgE: 81 RAST panel: Cockroach 0.2 / D. farinae 0.2 / D. pteronyssinus 0.24   12/23/16 INR: 1.0 PTT: 24.1 Chromatin Ab:  <0.2 Smith Ab: <0.2 DS DNA Ab:  <1 SSA:  <0.2 SSB:  <0.2 Anti-CCP:  <16 SCL-70:  <0.2 ENA RNP Ab: Centromere Ab Screen:  <0.2 Jo-1 Ab:  <0.2  05/12/16 ANA: Negative Rheumatoid factor:  <10   10/19/2017 Acute OV : Cough  Patient presents for an acute office visit.  Patient complains of 2 weeks of increased cough congestion and sinus drainage, low grade fever. Taking claritin and tessalon without much better.  Appetite is good. No n/v/d. Wheezing is getting worse. Cough and wheezing worse last 2 days.  Cough is aggravating would like refill of tessalon .  rec Zpack take as directed.  Mucinex DM Twice daily  As needed  Cough/congestion  Fluids and rest .  Tylenol As needed   Continue on Allegra and Flonase .  Follow up with Dr. Vaughan Browner and As needed        02/08/2018 acute extended ov/Robin [name] re:  acute on chronic cough x decades / establish with me Robin [name] pt) Chief Complaint  Patient presents with  . Acute Visit    Pt c/o cough with yellow, wheezing and SOB x 3 days. She states her chest feels sore when she coughs. She has been using her albuterol inahler 3 x daily on average.   baseline = maint protonix right before  first meal then before supper, and singuliar and needing saba  3 x  Times a day but not noct then severe cough /subj  wheeze/ chest soreness 3 days prior to OV and sob with any activity  rec Prednisone 10 mg take  4 each am x 2 days,   2 each am x 2 days,  1 each am x 2 days and stop  zpak Protonix 40  Mg Take 30- 60 min before your first and last meals of the day  For drainage / throat tickle try take CHLORPHENIRAMINE  4 mg - take one every 4 hours as needed   Take delsym two tsp every 12 hours and supplement if needed with  tramadol 50 mg up to 2 every 4 hours GERD  Diet   Please schedule a follow up office visit in 4 weeks, sooner if needed  with all medications /inhalers/ solutions in hand so we can verify exactly what you are taking. This includes all medications from all doctors and over the counters   03/08/2018  f/u ov/Robin [name] re:  Cough x decades / did not bring all meds but most of them  Chief Complaint  Patient presents with  . Follow-up    Breathing is unchanged. She is still coughing up yellow sputum. She is using her albuterol inhaler 3 x per wk on average.   Dyspnea:  Even if not coughing doe x room to room  X  Years = MMRC3 = can't walk 100 yards even at a slow pace at a flat grade s stopping due to sob  = stops a lot to shop  Cough: fits of cough are  Sporadic daytime mostly  Sleep: she says fine, husband hears wheezing  SABA use:  Doesn't really help/ note MCT neg   No obvious day to day or daytime variability or assoc excess/ purulent sputum or mucus plugs or hemoptysis or cp or chest tightness, subjective wheeze or overt sinus or hb symptoms. No unusual exposure hx or h/o childhood pna/ asthma or knowledge of premature birth.  Sleeping  Ok   without being aware of any nocturnal  or early am exacerbation  of respiratory  c/o's or need for noct saba. Also denies any obvious fluctuation of symptoms with weather or environmental changes or other aggravating or alleviating factors  except as outlined above   Current Allergies, Complete Past Medical History, Past Surgical History, Family History, and Social History were reviewed in [employer] record.  ROS  The following are not active complaints unless bolded Hoarseness, sore throat, dysphagia, dental problems, itching, sneezing,  nasal congestion or discharge of excess mucus or purulent secretions, ear ache,   fever, chills, sweats, unintended wt loss or wt gain, classically pleuritic or exertional cp,  orthopnea pnd or arm/hand swelling  or leg swelling, presyncope, palpitations, abdominal pain, anorexia, nausea, vomiting, diarrhea  or change in bowel habits or change in bladder habits, change in stools or change in urine, dysuria, hematuria,  rash, arthralgias, visual complaints, headache, numbness, weakness or ataxia or problems with walking or coordination,  change in mood or  memory.        Current Meds  Medication Sig  .  acetaminophen (TYLENOL) 500 MG tablet Take 1 tablet (500 mg total) by mouth every 6 (six) hours as needed for headache. (Patient taking differently: Take 1,000 mg by mouth 2 (two) times daily. )  . albuterol (PROVENTIL HFA;VENTOLIN HFA) 108 (90 Base) MCG/ACT inhaler Inhale 2 puffs into the lungs every 6 (six) hours as needed for wheezing or shortness of breath.  Marland Kitchen amitriptyline (ELAVIL) 50 MG tablet Take 1 tablet (50 mg total) by mouth at bedtime.  Marland Kitchen aspirin 81 MG tablet Take 1 tablet (81 mg total) by mouth at bedtime. Reported on 12/09/2015  . buPROPion (WELLBUTRIN XL) 300 MG 24 hr tablet Take 1 tablet (300 mg total) by mouth every morning. Reported on 12/09/2015  . calcium carbonate (TUMS - DOSED IN MG ELEMENTAL CALCIUM) 500 MG chewable tablet Chew 1 tablet by mouth daily as needed for indigestion or heartburn.  . diclofenac sodium (VOLTAREN) 1 % GEL Apply 2 g topically 4 (four) times daily.  . fluticasone (FLONASE) 50 MCG/ACT nasal spray Place 2 sprays into both nostrils daily.  .  furosemide (LASIX) 40 MG tablet 1/2 to 1 tab daily for lower extremity swelling  . gabapentin (NEURONTIN) 300 MG capsule TAKE 2 CAPSULES BY MOUTH 3 TIMES DAILY.  Marland Kitchen Glucose Blood (BLOOD GLUCOSE TEST STRIPS) STRP Use as directed. One Touch.  . hydrALAZINE (APRESOLINE) 25 MG tablet TAKE 1 TABLET BY MOUTH 3 TIMES DAILY  . HYDROcodone-acetaminophen (NORCO/VICODIN) 5-325 MG tablet Take 1 tablet by mouth every 12 (twelve) hours.  Marland Kitchen levothyroxine (SYNTHROID, LEVOTHROID) 88 MCG tablet TAKE 1 TABLET BY MOUTH DAILY.  Marland Kitchen LINZESS 290 MCG CAPS capsule TAKE 1 CAPSULE BY MOUTH DAILY BEFORE BREAKFAST.  . meloxicam (MOBIC) 15 MG tablet Take 15 mg by mouth daily.  . metFORMIN (GLUCOPHAGE) 500 MG tablet TAKE 1/2 TABLET DAILY WITH BREAKFAST BY MOUTH.  . methocarbamol (ROBAXIN) 500 MG tablet Take 1 tablet (500 mg total) by mouth every 8 (eight) hours as needed for muscle spasms. (Patient taking differently: Take 500 mg by mouth at bedtime as needed for muscle spasms. )  . montelukast (SINGULAIR) 10 MG tablet Take 1 tablet (10 mg total) by mouth at bedtime.  . Multiple Vitamin (MULTIVITAMIN) tablet Take 1 tablet by mouth daily. Reported on 12/09/2015  . pantoprazole (PROTONIX) 40 MG tablet Take 1 tablet (40 mg total) by mouth 2 (two) times daily before a meal.  . [  carvedilol (COREG) 3.125 MG tablet Take 1 tablet (3.125 mg total) by mouth 2 (two) times daily with a meal.  . [  loratadine (CLARITIN) 10 MG tablet Take 1 tablet (10 mg total) by mouth daily. (Patient taking differently: Take 10 mg by mouth daily as needed for allergies. )  .  ] losartan (COZAAR) 100 MG tablet Take 1 tablet (100 mg total) by mouth daily.                  Objective:   Physical Exam   amb obese bf no longer with any coughing fits/ ? Belle affect   03/08/2018             337   02/08/18 (!) 333 lb (151 kg)  01/27/18 (!) 328 lb 9.6 oz (149.1 kg)  12/14/17 (!) 326 lb 6.4 oz (148.1 kg)      Vital signs reviewed - Note on arrival 02  sats  98% on RA      HEENT: nl dentition  and oropharynx. Nl external ear canals without cough reflex - moderate bilateral  non-specific turbinate edema     NECK :  without JVD/Nodes/TM/ nl carotid upstrokes bilaterally   LUNGS: no acc muscle use,  Nl contour chest which is clear to A and P bilaterally without cough on insp or exp maneuvers   CV:  RRR  no s3 or murmur or increase in P2, and trace sym pedal edema   ABD:  soft and nontender with nl inspiratory excursion in the supine position. No bruits or organomegaly appreciated, bowel sounds nl  MS:  Nl gait/ ext warm without deformities, calf tenderness, cyanosis or clubbing No obvious joint restrictions   SKIN: warm and dry without lesions    NEURO:  alert, approp, nl sensorium with  no motor or cerebellar deficits apparent.     Labs ordered/ reviewed:      Chemistry      Component Value Date/Time   NA 141 03/08/2018 1137   NA 143 03/04/2018 1110   K 4.3 03/08/2018 1137   K 4.2 06/25/2011 1112   CL 103 03/08/2018 1137   CO2 33 (H) 03/08/2018 1137   BUN 10 03/08/2018 1137   BUN 10 03/04/2018 1110   CREATININE 0.85 03/08/2018 1137   CREATININE 0.80 09/22/2016 1539      Component Value Date/Time   CALCIUM 9.5 03/08/2018 1137   CALCIUM 9.4 06/25/2011 1112   ALKPHOS 81 03/04/2018 1110   ALKPHOS 54 06/25/2011 1112   AST 18 03/04/2018 1110   AST 16 06/25/2011 1112   ALT 20 03/04/2018 1110   BILITOT 0.3 03/04/2018 1110   BILITOT 0.6 06/25/2011 1112        Lab Results  Component Value Date   WBC 6.8 03/08/2018   HGB 11.7 (L) 03/08/2018   HCT 36.9 03/08/2018   MCV 87.4 03/08/2018   PLT 362.0 03/08/2018       Lab Results  Component Value Date   TSH 2.29 03/08/2018     Lab Results  Component Value Date   PROBNP 25.0 03/08/2018                 Assessment:

## 2018-03-09 ENCOUNTER — Encounter: Payer: Self-pay | Admitting: Internal Medicine

## 2018-03-09 ENCOUNTER — Telehealth: Payer: Self-pay

## 2018-03-09 ENCOUNTER — Telehealth: Payer: Self-pay | Admitting: Internal Medicine

## 2018-03-09 MED FILL — BENZONATATE 100 MG CAP: 100 | 30 days supply | Qty: 90 | Fill #0

## 2018-03-09 NOTE — Assessment & Plan Note (Signed)
Body mass index is 51.33 kg/m.  -  trending up  Lab Results  Component Value Date   TSH 2.29 03/08/2018     Contributing to gerd risk/ doe/reviewed the need and the process to achieve and maintain neg calorie balance > defer f/u primary care including intermittently monitoring thyroid status

## 2018-03-09 NOTE — Assessment & Plan Note (Addendum)
Allergy profile 02/16/17  >  Eos 0.1 /  IgE  81 pos dust/ cockroach  - MCT 02/24/17 neg  - DgEs  01/20/18 : Small reducible hiatal hernia with trace gastroesophageal reflux. No evidence of stricture. 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   These are the same pts (now being labeled as having "irritable larynx syndrome" by some cough centers) who not infrequently have a history of having failed to tolerate ace inhibitors and sometimes even ARB's,  dry powder inhalers or biphosphonates or report having atypical/extraesophageal reflux symptoms that don't respond to standard doses of PPI  and are easily confused as having aecopd or asthma flares by even experienced allergists/ pulmonologists (myself included).    rec Try off losartan and coreg to be complete (see separate a/p)  Add 1st gen H1 blockers per guidelines   Continue max rx for gerd  Add prn tessalon to stop cyclical daytime throat clearing s sedating her. F/u in 4 weeks with all meds in hand using a trust but verify approach to confirm accurate Medication  Reconciliation The principal here is that until we are certain that the  patients are doing what we've asked, it makes no sense to ask them to do more.

## 2018-03-09 NOTE — Assessment & Plan Note (Signed)
In the setting of respiratory symptoms of unknown etiology,  It would be preferable to use bystolic, the most beta -1  selective Beta blocker available in sample form, with bisoprolol the most selective generic choice  on the market, at least on a trial basis, to make sure the spillover Beta 2 effects of the less specific Beta blockers are not contributing to this patient's symptoms.   Try bisoprolol 5 mg bid  

## 2018-03-09 NOTE — Telephone Encounter (Signed)
Received a PA from pt's pharmacy for the bisoprolol.  Called [facility] to see if I could intitiate the PA for pt's med.  Spoke with Dian Situ and stated to him both losartan and carvedilol were stopped and pt was told to take the bisoprolol instead.  When speaking with  Dian Situ, he stated pt needs to have tried and failed two meds in order to see if the bisoprolol would be approved.  Per Dian Situ, the losartan was not on one of the list of meds in the same category for the tried and failed as the bisoprolol.  The carvedilol was on the list of the tried and failed and we need to have one more med in the same category that pt has tried and failed.  Dr. Melvyn Novas, please advise on this. If pt has not tried or failed any other meds in the same class as the bisoprolol, pt will need to be placed on a different med before we can try to initiate the PA for the bisoprolol.

## 2018-03-09 NOTE — Assessment & Plan Note (Addendum)
[facility]  09/29/16 LVEDP 27  03/08/2018   Walked RA x one lap @ 185 stopped due to  Tired > sob/ slow pace no desat - Spirometry 03/08/2018  FEV1 1.55 (63%)  Ratio 84 with min curvature - try off losartan/ coreg 03/08/2018    Symptoms are markedly disproportionate to objective findings and not clear to what extent this is actually a pulmonary  problem but pt does appear to have difficult to sort out respiratory symptoms of unknown origin for which  DDX  = almost all start with A and  include Adherence, Ace Inhibitors, Acid Reflux, Active Sinus Disease, Alpha 1 Antitripsin deficiency, Anxiety masquerading as Airways dz,  ABPA,  Allergy(esp in young), Aspiration (esp in elderly), Adverse effects of meds,  Active smokers, A bunch of PE's/clot burden (a few small clots can't cause this syndrome unless there is already severe underlying pulm or vascular dz with poor reserve),  Anemia or thyroid disorder, plus two Bs  = Bronchiectasis and Beta blocker use..and one C= CHF     Adherence is always the initial "prime suspect" and is a multilayered concern that requires a "trust but verify" approach in every patient - starting with knowing how to use medications, especially inhalers, correctly, keeping up with refills and understanding the fundamental difference between maintenance and prns vs those medications only taken for a very short course and then stopped and not refilled.  - return with all meds for med reconciliation.  To keep things simple, I have asked the patient to first separate medicines that are perceived as maintenance, that is to be taken daily "no matter what", from those medicines that are taken on only on an as-needed basis and I have given the patient examples of both, and then return to see our NP to generate a  detailed  medication calendar which should be followed until the next physician sees the patient and updates it.   ? Acid (or non-acid) GERD > always difficult to exclude as up to 75% of pts in  some series report no assoc GI/ Heartburn symptoms and she has documented gerd by DgEs > rec continue max (24h)  acid suppression and diet restrictions/ reviewed     ? Allergy/ asthma > neg MCT with no evidence of airflow obst despite rx with coreg / continue singulair to see if helps upper airway symptoms  ? Acei effects vs ARB For reasons that may related to vascular permability and nitric oxide pathways but not elevated  bradykinin levels (as seen with  ACEi use) losartan in the generic form has been reported now from mulitple sources  to cause a similar pattern of non-specific  upper airway symptoms as seen with acei.   This has not been reported with exposure to the other ARB's to date, so it seems reasonable to use an alternative class altogether.  See:  Lelon Frohlich Allergy Asthma Immunol  2008: 101: p [phone]    ? Anemia/ thyroid dz >  Nl studies this ov   ? Anxiety/depression > usually at the bottom of this list of usual suspects but should be much higher on this pt's based on H and P and note already on psychotropics and may interfere with adherence and also interpretation of response or lack thereof to symptom management which can be quite subjective.   ? BB effects > change to bisoprolol      ? CHF > note elevated EDP thoug low bnp this ov reassuring   I had an extended  discussion with the patient reviewing all relevant studies completed to date and  lasting 25 minutes of a 40  minute office visit addressing  severe non-specific but potentially very serious refractory respiratory symptoms of uncertain and potentially multiple  etiologies.   Each maintenance medication was reviewed in detail including most importantly the difference between maintenance and prns and under what circumstances the prns are to be triggered using an action plan format that is not reflected in the computer generated alphabetically organized AVS.    Please see AVS for specific instructions unique to this office  visit that I personally wrote and verbalized to the the pt in detail and then reviewed with pt  by my nurse highlighting any changes in therapy/plan of care  recommended at today's visit.

## 2018-03-09 NOTE — Telephone Encounter (Signed)
Pt came into the office to get medications went over lab results with pt

## 2018-03-09 NOTE — Telephone Encounter (Signed)
I wasn't sure this was an issue anyway so leave on original rx and we'll address at f/u ov

## 2018-03-14 ENCOUNTER — Inpatient Hospital Stay: Admission: RE | Admit: 2018-03-14 | Payer: Medicaid Other | Source: Ambulatory Visit

## 2018-03-22 ENCOUNTER — Telehealth: Payer: Self-pay | Admitting: Internal Medicine

## 2018-03-22 NOTE — Telephone Encounter (Signed)
Called and spoke with patient advised her that we would need her formulary to see what prescriptions are covered or are cheaper. Patient has been advised to contact pharmacy or insurance. Patient will call back once this has been done.

## 2018-03-24 MED FILL — PANTOPRAZOLE SOD DR 40 MG T: 40 | 30 days supply | Qty: 60 | Fill #3

## 2018-03-24 MED FILL — BUPROPION HCL XL 300 MG TAB: 300 | 30 days supply | Qty: 30 | Fill #5

## 2018-03-24 MED FILL — AMLODIPINE BESYLATE 10 MG T: 10 | 30 days supply | Qty: 30 | Fill #5

## 2018-03-24 MED FILL — metFORMIN HCL 500 MG TABS: 500 | 30 days supply | Qty: 15 | Fill #1

## 2018-03-28 MED ORDER — METOPROLOL TARTRATE 50 MG PO TABS: 50.0000 mg | ORAL_TABLET | Freq: Two times a day (BID) | ORAL | 0 refills | Status: DC

## 2018-03-28 MED FILL — FUROSEMIDE 40 MG TAB: 40 | 30 days supply | Qty: 30 | Fill #2

## 2018-03-28 MED FILL — AMITRIPTYLINE HCL 50 MG TAB: 50 | 30 days supply | Qty: 30 | Fill #6

## 2018-03-28 MED FILL — MONTELUKAST SOD 10 MG TAB: 10 | 30 days supply | Qty: 30 | Fill #8

## 2018-03-28 MED FILL — METOPROLOL TARTRATE 50 MG T: 50 | 30 days supply | Qty: 60 | Fill #0

## 2018-03-28 MED FILL — GABAPENTIN 300 MG CAPSULE: 300 | 30 days supply | Qty: 180 | Fill #1

## 2018-03-28 NOTE — Telephone Encounter (Signed)
metaprolol 50 mg bid ok

## 2018-03-28 NOTE — Telephone Encounter (Signed)
Patient has Medicaid therefore I was able to look up the alternatives online.   Alternatives for bisoprolol include: atenolol, carvedilol, labetalol, metoprolol and propranolol.   MW, please advise if you are willing to switch the patient to any of the medications mentioned above. Thanks!

## 2018-03-28 NOTE — Telephone Encounter (Signed)
Called and spoke with pt today regarding new medication of Bisoprolol.  Pt having Medicaid it is not covered; Alternatives are atenolol, carvedilol, labetalol, metoprolol and propranolol. MW is okay with having pt begin metoprolol 50mg  BID. [facility] for RX today to [facility] Pt verbalized understanding, and had no further questions. Nothing further needed at this time.

## 2018-03-31 DIAGNOSIS — Z6841 Body Mass Index (BMI) 40.0 and over, adult: Secondary | ICD-10-CM | POA: Diagnosis not present

## 2018-03-31 DIAGNOSIS — E782 Mixed hyperlipidemia: Secondary | ICD-10-CM | POA: Diagnosis not present

## 2018-03-31 DIAGNOSIS — E119 Type 2 diabetes mellitus without complications: Secondary | ICD-10-CM | POA: Insufficient documentation

## 2018-04-01 ENCOUNTER — Ambulatory Visit: Payer: Medicaid Other | Admitting: Podiatry

## 2018-04-01 ENCOUNTER — Encounter: Payer: Self-pay | Admitting: Podiatry

## 2018-04-01 VITALS — BP 127/61 | HR 70 | Resp 16

## 2018-04-01 DIAGNOSIS — E1142 Type 2 diabetes mellitus with diabetic polyneuropathy: Secondary | ICD-10-CM | POA: Diagnosis not present

## 2018-04-01 DIAGNOSIS — B351 Tinea unguium: Secondary | ICD-10-CM

## 2018-04-01 DIAGNOSIS — M79675 Pain in left toe(s): Secondary | ICD-10-CM | POA: Diagnosis not present

## 2018-04-01 DIAGNOSIS — M79674 Pain in right toe(s): Secondary | ICD-10-CM | POA: Diagnosis not present

## 2018-04-01 DIAGNOSIS — N952 Postmenopausal atrophic vaginitis: Secondary | ICD-10-CM | POA: Insufficient documentation

## 2018-04-01 NOTE — Progress Notes (Signed)
Medicaid

## 2018-04-01 NOTE — Patient Instructions (Signed)

## 2018-04-02 ENCOUNTER — Encounter: Payer: Self-pay | Admitting: Podiatry

## 2018-04-02 NOTE — Progress Notes (Signed)
Subjective: Robin [name] is a 62 y.o. AAF who presents today with diabetes, diabetic neuropathy and cc of painful, discolored, thick toenails which interfere with activities of daily living such as cleaning, cooking and performing routine tasks. Pain is aggravated when wearing enclosed shoe gear. Pain is getting progressively worse and relieved with periodic professional debridement. Most symptomatic is her right great toe which she states the nail is growing over the tip of her toe. Duration is greater than 2 weeks. She denies any attempt at treatment of her symptoms due to her fear of possibly cutting herself.  Medical History Collapse by Default  Collapse by Default      Date Unknown Anxiety disorder  Date Unknown Arthritis  Date Unknown Asthma  Date Unknown Chronic neck pain  Date Unknown Constipation  Date Unknown Depression  Date Unknown Diabetes mellitus without complication ([place])  Date Unknown GERD (gastroesophageal reflux disease)  Date Unknown Hiatal hernia   Date Unknown Hyperlipidemia  Date Unknown Hypertension  Date Unknown Hypothyroidism  Date Unknown Schatzki's ring   Date Unknown Sleep apnea    Problem List Collapse by Default  Collapse by Default      Cardiovascular and Mediastinum   Essential hypertension  Respiratory   OSA (obstructive sleep apnea)   Chronic allergic rhinitis  Digestive   GERD   Fatty liver   Lactose intolerance   Dysphagia  Endocrine   Hypothyroidism   Chronic lymphocytic thyroiditis   Diabetes mellitus ([place])   Pituitary macroadenoma ([place])  Musculoskeletal and Integument   Generalized OA  Genitourinary   Atrophic vaginitis  Other   Fibromyalgia   Prediabetes   Abnormal small bowel biopsy   Constipation   Morbid obesity due to excess calories ([facility])   Depression   DOE (dyspnea on exertion)   Hemoptysis   Hx of iron deficiency anemia   Early satiety   Upper airway cough syndrome   Anxiety   History of  gastroesophageal reflux (GERD)   Hyperlipidemia   Refusal of blood transfusions as patient is Jehovah's Witness   Throat pain in adult   Urinary incontinence   Surgical History Collapse by Default  Collapse by Default      01/20/2018 Esophagogastroduodenoscopy (egd) with propofol (N/A)   01/20/2018 Maloney dilation (N/A)   03/08/2017 Video bronchoscopy (Bilateral)   04/17/2015 Esophagogastroduodenoscopy (N/A)   04/17/2015 Esophageal dilation   06/2012 Pituitary surgery   03/15/2012 Direct laryngoscopy   03/15/2012 Esophagoscopy   08/06/11 Colonoscopy   08/06/11 Esophagogastroduodenoscopy   08/23/2008 Esophagogastroduodenoscopy   2006 Colonoscopy   08/18/2003 Cardiac catheterization   2003 Transthoracic echocardiogram   2003 Nm myocar perf wall motion   04/27/2001 Knee arthroscopy  02/18/2000 Abdominal hysterectomy  Date Unknown Abdominal hysterectomy  Date Unknown Rectocele repair  Date Unknown Vaginal prolapse repair    Medications    ferrous sulfate 325 (65 FE) MG tablet    fexofenadine (ALLEGRA) 180 MG tablet    ipratropium (ATROVENT) 0.06 % nasal spray    acetaminophen (TYLENOL) 500 MG tablet    albuterol (PROVENTIL HFA;VENTOLIN HFA) 108 (90 Base) MCG/ACT inhaler    amitriptyline (ELAVIL) 50 MG tablet    amLODipine (NORVASC) 10 MG tablet    aspirin 81 MG tablet    benzonatate (TESSALON) 200 MG capsule    bisoprolol (ZEBETA) 5 MG tablet    buPROPion (WELLBUTRIN XL) 300 MG 24 hr tablet    calcium carbonate (TUMS - DOSED IN MG ELEMENTAL CALCIUM) 500 MG chewable tablet    Cholecalciferol (VITAMIN  D3) 5000 units TABS    colesevelam (WELCHOL) 625 MG tablet    diclofenac sodium (VOLTAREN) 1 % GEL    fluticasone (FLONASE) 50 MCG/ACT nasal spray    fluticasone (FLOVENT DISKUS) 50 MCG/BLIST diskus inhaler    furosemide (LASIX) 40 MG tablet    gabapentin (NEURONTIN) 300 MG capsule    Glucose Blood (BLOOD GLUCOSE TEST STRIPS) STRP    hydrALAZINE (APRESOLINE) 25 MG tablet     HYDROcodone-acetaminophen (NORCO/VICODIN) 5-325 MG tablet    levocetirizine (XYZAL) 5 MG tablet    levothyroxine (SYNTHROID, LEVOTHROID) 88 MCG tablet    LINZESS 290 MCG CAPS capsule    meloxicam (MOBIC) 15 MG tablet    metFORMIN (GLUCOPHAGE) 500 MG tablet    methocarbamol (ROBAXIN) 500 MG tablet    metoprolol tartrate (LOPRESSOR) 50 MG tablet    montelukast (SINGULAIR) 10 MG tablet    Multiple Vitamin (MULTIVITAMIN) tablet    pantoprazole (PROTONIX) 40 MG tablet    traMADol (ULTRAM) 50 MG tablet    Allergies     Celexa [Citalopram Hydrobromide]Other (See Comments)  Diflucan [Fluconazole]Rash   Tobacco History Collapse by Default  Collapse by Default      Smoking Status  Former Smoker  Started  07/14/1975 - 04/04/1993 Quit  Types  Cigarettes  Amount 0.5 packs/day for 10 years (5.00 pk-yrs)      Smokeless Tobacco Status  Never Used        Comment  17 yrs ago   Family History Collapse by Default  Collapse by Default      Father (Deceased) Kidney cancer    Hypertension    Hyperlipidemia    Sleep apnea         Mother Diabetes    Hypertension    Heart Problems    Hyperlipidemia    Asthma    Sleep apnea         Brother Liver disease    Arthritis         Sister Hypertension    Allergic rhinitis         Brother         Brother         Sister         Unknown Stomach cancer          Maternal Grandmother Breast cancer         Maternal Grandfather Kidney disease         Paternal Grandmother Breast cancer         Brother Hypertension         Brother Hyperlipidemia         Brother Hypertension         Sister Hypertension         Sister Hyperlipidemia         Maternal Aunt Lupus            Neg Hx Colon cancer    Eczema    Immunodeficiency    Urticaria    ROS: all systems negative with exception of: HEENT: eyeglasses MS: pedal numbness, tingling, burning and toe symptoms.   Objective: Vitals:   04/01/18 0845  BP: 127/61   Pulse: 70  Resp: 16   Vascular Examination: Capillary refill time immediate x 10 digits Dorsalis pedis and Posterior tibial pulses present b/l No digital hair x 10 digits Skin temperature warm b/l and symmetrically Trace pedal edema BLE  Dermatological Examination: Skin with normal turgor, texture and tone b/l Toenails 1-5 b/l discolored, thick, dystrophic  with subungual debris and pain with palpation to nailbeds due to thickness of nails. Right great toe nailplate noted to curve over distal tip of digit with pain on palpation. No underlying edema, no erythema, no flocculence, no drainage.  Musculoskeletal: Muscle strength 5/5 to all LE muscle groups  Neurological: Sensation diminished with 10 gram monofilament. Vibratory sensation diminished  Last HgA1C:  5.6% (May 2018)  Assessment: 1. Painful onychomycosis toenails 1-5 b/l 2. NIDDM with Diabetic neuropathy  Plan: 1. Discussed diabetic foot care principles. Patient information sheet given to patient on today. AVS given. 2. Toenails 1-5 b/l were debrided in length and girth without iatrogenic bleeding. 3. Patient to continue soft, supportive shoe gear 4. Patient to report any pedal injuries to medical professional  5. Follow up 3 months for routine diabetic footcare due to dx of diabetic neuropathy. 6. Patient/POA to call should there be a concern in the interim.

## 2018-04-06 ENCOUNTER — Ambulatory Visit (INDEPENDENT_AMBULATORY_CARE_PROVIDER_SITE_OTHER): Payer: Medicaid Other | Admitting: Adult Health

## 2018-04-06 ENCOUNTER — Encounter: Payer: Self-pay | Admitting: Adult Health

## 2018-04-06 DIAGNOSIS — J309 Allergic rhinitis, unspecified: Secondary | ICD-10-CM

## 2018-04-06 DIAGNOSIS — K219 Gastro-esophageal reflux disease without esophagitis: Secondary | ICD-10-CM

## 2018-04-06 DIAGNOSIS — R0609 Other forms of dyspnea: Secondary | ICD-10-CM

## 2018-04-06 NOTE — Patient Instructions (Addendum)
Add Delsym 2 tsp twice daily for cough As needed   Follow med calendar closely and bring to each visit.  Work on weight loss.  GERD diet .  Follow up with Primary MD for blood pressure.  Follow up with Dr. Melvyn Novas  In 3 months and As needed

## 2018-04-06 NOTE — Progress Notes (Signed)
$'@Patient'q$  ID: Robin [name], female    DOB: 15-Apr-1956, 62 y.o.   MRN: [id number]  Chief Complaint  Patient presents with  . Follow-up    dyspnea     Referring provider: Ladell Pier, MD  HPI: 62 year old female former smoker seen March 2018 with Dr. [name] for dyspnea, chronic allergic rhinitis and hemoptysis (felt secondary to erosions from tonsils status post bronchoscopy)  TEST   PFT 02/09/17: FVC 2.35 L (77%) FEV1 2.06 L (85%) FEV1/FVC 0.88 FEF 25-75 3.32 L (142%) negative bronchodilator response TLC 3.74 L (68%) RV 71% ERV 28% DLCO corrected 71% 08/22/15: FVC 2.32 L (119%) FEV1 1.99 L (121%) FEV1/FVC 0.85 FEF 25-75 2.59 L (118%)  6MWT 02/16/17: Walked 126 meters / Baseline Sat 99% On RA / Nadir Sat 99% on RA  METHACHOLINE CHALLENGE TEST (02/24/17): Normal bronchial hyperreactivity.  IMAGING BARIUM SWALLOW/ESOPHAGRAM 04/30/17 (per radiologist):Hiatal hernia with mild reflux. Esophageal dysmotility  CT NECK/SOFT TISSUE W/ CONTRAST 04/19/17 (per radiologist):No inflammatory change or foreign body can be seen in the pharynx. Cervical spondylosis. 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  CT CHEST W/O 01/07/17 (previously reviewed by me): No parenchymal nodule, mass, or opacity appreciated. No pleural effusion or thickening. No pericardial effusion. No pathologic mediastinal adenopathy.  CTA CHEST 12/13/15 (previously reviewed by me):No pulmonary emboli. No pleural effusion or thickening. No pericardial effusion. No pathologic mediastinal adenopathy. No parenchymal nodule, mass, or opacification.  CARDIAC TTE (09/29/16): LV normal in size with moderate concentric hypertrophy. EF 55-60% with no regional wall motion abnormalities. Indeterminant diastolic function. LA & RA normal in size. RV normal in size and function. No aortic stenosis or regurgitation. Aortic root normal in size. No mitral stenosis or  regurgitation. No significant pulmonic regurgitation. No significant tricuspid regurgitation. No pericardial effusion.  LEFT HEART CATHETERIZATION (11/13/3):  Aortic systolic pressure 465  Diastolic pressure 90  Left ventricular systolic pressure 035  End-diastolic pressure 27 left main coronary artery normal  Left anterior descending artery normal  Left circumflex coronary artery normal  Right coronary artery dominant and normal   Overall estimated ejection fraction greater than 60% without focal wall motion abnormality  MICROBIOLOGY Endobronchial/Endotracheal Brushing 03/08/17: Rare Haemophilus influenza beta lactamase positive  PATHOLOGY Endobronchial/Endotracheal Brushing 03/08/17: No malignancy. Negative for herpes & HPV.  LABS 05/28/17 BNP: 6.1 ESR: 34 TSH: 1.060 Anti-CCP: 6 RF: <10  02/16/17 IgE: 81 RAST panel: Cockroach 0.2 / D. farinae 0.2 / D. pteronyssinus 0.24   12/23/16 INR: 1.0 PTT: 24.1 Chromatin Ab: <0.2 Smith Ab: <0.2 DS DNA Ab: <1 SSA: <0.2 SSB: <0.2 Anti-CCP: <16 SCL-70: <0.2 ENA RNP Ab: Centromere Ab Screen: <0.2 Jo-1 Ab: <0.2  05/12/16 ANA: Negative Rheumatoid factor: <10  04/06/2018 Follow up : Cough and med review Patient returns for a one-month follow-up.  Patient's been having ongoing cough and shortness of breath.  Last visit she was changed from losartan and Coreg to bisoprolol.  Unfortunately insurance would not cover and she was changed from bisoprolol to metoprolol.  Patient says her cough did slightly improved.  But did not totally go away.  She continues to have some intermittent cough and wheezing.  Gets short of breath with activity.  Patient had a very extensive work-up for her cough and shortness of breath is been unrevealing.  Over the last few years patient's weight is went up 50 to 60 pounds.  Patient says she is been very inactive.  We discussed weight loss diet.  We reviewed all  her medications and organize  them into a medication count with patient education.  She appears to be taking her medications correctly.   Allergies  Allergen Reactions  . Celexa [Citalopram Hydrobromide] Other (See Comments)    "Made me feel out of it"  . Diflucan [Fluconazole] Rash    Immunization History  Administered Date(s) Administered  . Influenza,inj,Quad PF,6+ Mos 08/05/2016, 07/30/2017  . Influenza-Unspecified 07/25/2013, 09/04/2014  . Pneumococcal Polysaccharide-23 08/05/2016  . Tdap 08/05/2016    Past Medical History:  Diagnosis Date  . Anxiety disorder   . Arthritis   . Asthma   . Chronic neck pain   . Constipation   . Depression   . Diabetes mellitus without complication ([place])   . GERD (gastroesophageal reflux disease)   . Hiatal hernia    small  . Hyperlipidemia   . Hypertension   . Hypothyroidism   . Schatzki's ring    non critical  . Sleep apnea    CPAP, Sleep study at [facility]  . Sleep apnea     Tobacco History: Social History   Tobacco Use  Smoking Status Former Smoker  . Packs/day: 0.50  . Years: 10.00  . Pack years: 5.00  . Types: Cigarettes  . Start date: 07/14/1975  . Last attempt to quit: 04/04/1993  . Years since quitting: 25.0  Smokeless Tobacco Never Used  Tobacco Comment   17 yrs ago   Counseling given: Not Answered Comment: 17 yrs ago   Outpatient Medications Prior to Visit  Medication Sig Dispense Refill  . acetaminophen (TYLENOL) 500 MG tablet Take 1 tablet (500 mg total) by mouth every 6 (six) hours as needed for headache. (Patient taking differently: Take 1,000 mg by mouth 2 (two) times daily. ) 30 tablet 0  . albuterol (PROVENTIL HFA;VENTOLIN HFA) 108 (90 Base) MCG/ACT inhaler Inhale 2 puffs into the lungs every 6 (six) hours as needed for wheezing or shortness of breath. 1 Inhaler 2  . amitriptyline (ELAVIL) 50 MG tablet Take 1 tablet (50 mg total) by mouth at bedtime. 60 tablet 3  . amLODipine (NORVASC) 10 MG tablet TAKE 1 TABLET (10  MG TOTAL) BY MOUTH DAILY.  3  . aspirin 81 MG tablet Take 1 tablet (81 mg total) by mouth at bedtime. Reported on 12/09/2015 90 tablet 3  . benzonatate (TESSALON) 200 MG capsule Take 1 capsule (200 mg total) by mouth 3 (three) times daily as needed for cough. 45 capsule 1  . bisoprolol (ZEBETA) 5 MG tablet Take 1 tablet (5 mg total) by mouth daily. 60 tablet 11  . buPROPion (WELLBUTRIN XL) 300 MG 24 hr tablet Take 1 tablet (300 mg total) by mouth every morning. Reported on 12/09/2015 90 tablet 3  . calcium carbonate (TUMS - DOSED IN MG ELEMENTAL CALCIUM) 500 MG chewable tablet Chew 1 tablet by mouth daily as needed for indigestion or heartburn.    . Cholecalciferol (VITAMIN D3) 5000 units TABS Take by mouth.    . colesevelam (WELCHOL) 625 MG tablet Take by mouth.    . diclofenac sodium (VOLTAREN) 1 % GEL Apply 2 g topically 4 (four) times daily. 100 g 2  . ferrous sulfate 325 (65 FE) MG tablet TAKE 1 TABLET BY MOUTH 2 TIMES DAILY WITH A MEAL.    . fexofenadine (ALLEGRA) 180 MG tablet Take by mouth.    . fluticasone (FLONASE) 50 MCG/ACT nasal spray Place 2 sprays into both nostrils daily. 16 g 6  . fluticasone (FLOVENT DISKUS)  50 MCG/BLIST diskus inhaler Inhale into the lungs.    . furosemide (LASIX) 40 MG tablet 1/2 to 1 tab daily for lower extremity swelling 30 tablet 6  . gabapentin (NEURONTIN) 300 MG capsule TAKE 2 CAPSULES BY MOUTH 3 TIMES DAILY. 180 capsule 1  . Glucose Blood (BLOOD GLUCOSE TEST STRIPS) STRP Use as directed. One Touch. 100 each 1  . hydrALAZINE (APRESOLINE) 25 MG tablet TAKE 1 TABLET BY MOUTH 3 TIMES DAILY 90 tablet 2  . HYDROcodone-acetaminophen (NORCO/VICODIN) 5-325 MG tablet Take 1 tablet by mouth every 12 (twelve) hours.    Marland Kitchen ipratropium (ATROVENT) 0.06 % nasal spray Place into the nose.    . levocetirizine (XYZAL) 5 MG tablet Take by mouth.    . levothyroxine (SYNTHROID, LEVOTHROID) 88 MCG tablet TAKE 1 TABLET BY MOUTH DAILY. 90 tablet 0  . LINZESS 290 MCG CAPS capsule  TAKE 1 CAPSULE BY MOUTH DAILY BEFORE BREAKFAST. 30 capsule 5  . meloxicam (MOBIC) 15 MG tablet Take 15 mg by mouth daily.    . metFORMIN (GLUCOPHAGE) 500 MG tablet TAKE 1/2 TABLET DAILY WITH BREAKFAST BY MOUTH. 45 tablet 0  . methocarbamol (ROBAXIN) 500 MG tablet Take 1 tablet (500 mg total) by mouth every 8 (eight) hours as needed for muscle spasms. (Patient taking differently: Take 500 mg by mouth at bedtime as needed for muscle spasms. ) 60 tablet 1  . metoprolol tartrate (LOPRESSOR) 50 MG tablet Take 1 tablet (50 mg total) by mouth 2 (two) times daily. 60 tablet 0  . montelukast (SINGULAIR) 10 MG tablet Take 1 tablet (10 mg total) by mouth at bedtime. 30 tablet 11  . Multiple Vitamin (MULTIVITAMIN) tablet Take 1 tablet by mouth daily. Reported on 12/09/2015 90 tablet 1  . pantoprazole (PROTONIX) 40 MG tablet Take 1 tablet (40 mg total) by mouth 2 (two) times daily before a meal. 60 tablet 5  . traMADol (ULTRAM) 50 MG tablet Take 1 tablet (50 mg total) by mouth every 4 (four) hours as needed (for cough or pain). (Patient not taking: Reported on 03/08/2018) 40 tablet 0   No facility-administered medications prior to visit.      Review of Systems  Constitutional:   No  weight loss, night sweats,  Fevers, chills,  +fatigue, or  lassitude.  HEENT:   No headaches,  Difficulty swallowing,  Tooth/dental problems, or  Sore throat,                No sneezing, itching, ear ache, nasal congestion, post nasal drip,   CV:  No chest pain,  Orthopnea, PND, swelling in lower extremities, anasarca, dizziness, palpitations, syncope.   GI  No heartburn, indigestion, abdominal pain, nausea, vomiting, diarrhea, change in bowel habits, loss of appetite, bloody stools.   Resp:   No chest wall deformity  Skin: no rash or lesions.  GU: no dysuria, change in color of urine, no urgency or frequency.  No flank pain, no hematuria   MS:  + joint pain     Physical Exam  BP 118/74 (BP Location: Left Arm, Cuff  Size: Large)   Pulse 75   Ht '5\' 8"'$  (1.727 m)   Wt (!) 337 lb (152.9 kg)   SpO2 97%   BMI 51.24 kg/m   GEN: A/Ox3; pleasant , NAD, obese   HEENT:  [place]/AT,  EACs-clear, TMs-wnl, NOSE-clear, THROAT-clear, no lesions, no postnasal drip or exudate noted.  Class II-III MP airway   NECK:  Supple w/ fair ROM; no JVD; normal  carotid impulses w/o bruits; no thyromegaly or nodules palpated; no lymphadenopathy.    RESP  Clear  P & A; w/o, wheezes/ rales/ or rhonchi. no accessory muscle use, no dullness to percussion  CARD:  RRR, no m/r/g, tr peripheral edema, pulses intact, no cyanosis or clubbing.  GI:   Soft & nt; nml bowel sounds; no organomegaly or masses detected.   Musco: Warm bil, no deformities or joint swelling noted.   Neuro: alert, no focal deficits noted.    Skin: Warm, no lesions or rashes    Lab Results:  CBC    Component Value Date/Time   WBC 6.8 03/08/2018 1137   RBC 4.23 03/08/2018 1137   HGB 11.7 (L) 03/08/2018 1137   HGB 12.0 01/20/2017 1001   HCT 36.9 03/08/2018 1137   HCT 38.2 01/20/2017 1001   HCT 41 06/25/2011 1053   PLT 362.0 03/08/2018 1137   PLT 356 01/20/2017 1001   MCV 87.4 03/08/2018 1137   MCV 90 01/20/2017 1001   MCV 91.1 06/25/2011 1053   MCH 27.1 01/17/2018 1422   MCHC 31.6 03/08/2018 1137   RDW 14.2 03/08/2018 1137   RDW 13.1 01/20/2017 1001   LYMPHSABS 3.4 03/08/2018 1137   LYMPHSABS 3.4 (H) 01/20/2017 1001   MONOABS 1.0 03/08/2018 1137   EOSABS 0.1 03/08/2018 1137   EOSABS 0.1 01/20/2017 1001   BASOSABS 0.1 03/08/2018 1137   BASOSABS 0.0 01/20/2017 1001    BMET    Component Value Date/Time   NA 141 03/08/2018 1137   NA 143 03/04/2018 1110   K 4.3 03/08/2018 1137   K 4.2 06/25/2011 1112   CL 103 03/08/2018 1137   CO2 33 (H) 03/08/2018 1137   GLUCOSE 109 (H) 03/08/2018 1137   BUN 10 03/08/2018 1137   BUN 10 03/04/2018 1110   CREATININE 0.85 03/08/2018 1137   CREATININE 0.80 09/22/2016 1539   CALCIUM 9.5 03/08/2018 1137    CALCIUM 9.4 06/25/2011 1112   GFRNONAA 83 03/04/2018 1110   GFRNONAA 82 09/22/2016 1539   GFRAA 96 03/04/2018 1110   GFRAA >89 09/22/2016 1539    BNP    Component Value Date/Time   BNP 4.4 01/27/2018 1114   BNP 8.8 09/22/2016 1539    ProBNP    Component Value Date/Time   PROBNP 25.0 03/08/2018 1137    Imaging: No results found.   Assessment & Plan:   DOE (dyspnea on exertion) Suspect this is multifactorial with morbid obesity, weight gain deconditioning and upper airway cough. Continue to work on trigger prevention with cough control and GERD and rhinitis management. Patient's medications were reviewed today and patient education was given. Computerized medication calendar was adjusted/completed   Plan  Patient Instructions  Add Delsym 2 tsp twice daily for cough As needed   Follow med calendar closely and bring to each visit.  Work on weight loss.  GERD diet .  Follow up with Primary MD for blood pressure.  Follow up with Dr. Melvyn Novas  In 3 months and As needed       Chronic allergic rhinitis Cont on current regimen   GERD gerd diet and PPI   Morbid obesity due to excess calories ([place]) Wt loss      Rexene Edison, NP 04/06/2018

## 2018-04-06 NOTE — Assessment & Plan Note (Signed)
gerd diet and PPI

## 2018-04-06 NOTE — Assessment & Plan Note (Signed)
Suspect this is multifactorial with morbid obesity, weight gain deconditioning and upper airway cough. Continue to work on trigger prevention with cough control and GERD and rhinitis management. Patient's medications were reviewed today and patient education was given. Computerized medication calendar was adjusted/completed   Plan  Patient Instructions  Add Delsym 2 tsp twice daily for cough As needed   Follow med calendar closely and bring to each visit.  Work on weight loss.  GERD diet .  Follow up with Primary MD for blood pressure.  Follow up with Dr. Melvyn Novas  In 3 months and As needed

## 2018-04-06 NOTE — Assessment & Plan Note (Signed)
Wt loss  

## 2018-04-06 NOTE — Assessment & Plan Note (Signed)
Cont on current regimen  

## 2018-04-11 ENCOUNTER — Encounter: Payer: Self-pay | Admitting: Internal Medicine

## 2018-04-11 ENCOUNTER — Ambulatory Visit: Payer: Medicaid Other | Attending: Internal Medicine | Admitting: Internal Medicine

## 2018-04-11 VITALS — BP 150/71 | HR 64 | Temp 98.3°F | Resp 18 | Ht 68.0 in | Wt 337.0 lb

## 2018-04-11 DIAGNOSIS — Z7982 Long term (current) use of aspirin: Secondary | ICD-10-CM | POA: Diagnosis not present

## 2018-04-11 DIAGNOSIS — R600 Localized edema: Secondary | ICD-10-CM | POA: Insufficient documentation

## 2018-04-11 DIAGNOSIS — Z87891 Personal history of nicotine dependence: Secondary | ICD-10-CM | POA: Insufficient documentation

## 2018-04-11 DIAGNOSIS — Z6841 Body Mass Index (BMI) 40.0 and over, adult: Secondary | ICD-10-CM | POA: Diagnosis not present

## 2018-04-11 DIAGNOSIS — Z883 Allergy status to other anti-infective agents status: Secondary | ICD-10-CM | POA: Insufficient documentation

## 2018-04-11 DIAGNOSIS — R519 Headache, unspecified: Secondary | ICD-10-CM

## 2018-04-11 DIAGNOSIS — Z8249 Family history of ischemic heart disease and other diseases of the circulatory system: Secondary | ICD-10-CM | POA: Diagnosis not present

## 2018-04-11 DIAGNOSIS — Z79899 Other long term (current) drug therapy: Secondary | ICD-10-CM | POA: Diagnosis not present

## 2018-04-11 DIAGNOSIS — I10 Essential (primary) hypertension: Secondary | ICD-10-CM | POA: Diagnosis not present

## 2018-04-11 DIAGNOSIS — K219 Gastro-esophageal reflux disease without esophagitis: Secondary | ICD-10-CM | POA: Diagnosis not present

## 2018-04-11 DIAGNOSIS — Z803 Family history of malignant neoplasm of breast: Secondary | ICD-10-CM | POA: Diagnosis not present

## 2018-04-11 DIAGNOSIS — Z79890 Hormone replacement therapy (postmenopausal): Secondary | ICD-10-CM | POA: Diagnosis not present

## 2018-04-11 DIAGNOSIS — E119 Type 2 diabetes mellitus without complications: Secondary | ICD-10-CM | POA: Insufficient documentation

## 2018-04-11 DIAGNOSIS — Z90710 Acquired absence of both cervix and uterus: Secondary | ICD-10-CM | POA: Diagnosis not present

## 2018-04-11 DIAGNOSIS — R0602 Shortness of breath: Secondary | ICD-10-CM | POA: Diagnosis not present

## 2018-04-11 DIAGNOSIS — E785 Hyperlipidemia, unspecified: Secondary | ICD-10-CM | POA: Insufficient documentation

## 2018-04-11 DIAGNOSIS — J0100 Acute maxillary sinusitis, unspecified: Secondary | ICD-10-CM | POA: Insufficient documentation

## 2018-04-11 DIAGNOSIS — G4733 Obstructive sleep apnea (adult) (pediatric): Secondary | ICD-10-CM | POA: Insufficient documentation

## 2018-04-11 DIAGNOSIS — Z7984 Long term (current) use of oral hypoglycemic drugs: Secondary | ICD-10-CM | POA: Insufficient documentation

## 2018-04-11 DIAGNOSIS — R51 Headache: Secondary | ICD-10-CM | POA: Diagnosis not present

## 2018-04-11 DIAGNOSIS — Z833 Family history of diabetes mellitus: Secondary | ICD-10-CM | POA: Insufficient documentation

## 2018-04-11 LAB — GLUCOSE, POCT (MANUAL RESULT ENTRY): POC Glucose: 126 mg/dl — AB (ref 70–99)

## 2018-04-11 MED ORDER — AMOXICILLIN-POT CLAVULANATE 875-125 MG PO TABS: 1.0000 | ORAL_TABLET | Freq: Two times a day (BID) | ORAL | 0 refills | Status: DC

## 2018-04-11 MED ORDER — DULOXETINE HCL 30 MG PO CPEP: 30.0000 mg | ORAL_CAPSULE | Freq: Every day | ORAL | 3 refills | Status: DC

## 2018-04-11 MED ORDER — HYDRALAZINE HCL 50 MG PO TABS: 50.0000 mg | ORAL_TABLET | Freq: Two times a day (BID) | ORAL | 5 refills | Status: DC

## 2018-04-11 MED FILL — DULoxetine HCL 30 MG CPEP: 30 | 30 days supply | Qty: 30 | Fill #0

## 2018-04-11 MED FILL — hydrALAZINE HCL 50 MG TABS: 50 | 30 days supply | Qty: 60 | Fill #0

## 2018-04-11 MED FILL — AMOX-CLAV 875-125 MG TABLET: 875-125 | 6 days supply | Qty: 12 | Fill #0

## 2018-04-11 NOTE — Progress Notes (Signed)
Chart and office note reviewed in detail  > agree with a/p as outlined    

## 2018-04-11 NOTE — Patient Instructions (Signed)
Gabapentin taper: 2 tablets twice a day for 1 week. Then 1 tablet twice a day for 1 week. Then 1 tablet daily x 1 week.

## 2018-04-11 NOTE — Progress Notes (Signed)
Patient ID: Robin [name], female    DOB: 06-07-1956  MRN: [id number]  CC: Follow-up   Subjective: Robin [name] is a 62 y.o. female who presents for short term f/u on LE edema Her concerns today include:  Patient with history of HTN, pre-DM, OSA, fibromyalgia, depression, obesity, GERD, hypothyroid, OA of multiple js (hands, knees, shoulders)  LE edema: On last visit we stopped amlodipine and placed her on carvedilol instead to see whether the lower extremity edema would decrease.  She reports that the swelling in the legs have decreased a lot.  Her pulmonary doctor however changed her Lucienne Minks but her insurance would not cover for it so it was changed to metoprolol 50 mg twice a day.  BNP was normal.  TSH was normal. Still taking Furosemide.  HTN:  Checks BP once a day in a.m.   Gives range 150s/70s lately C/o having a HA for the past 3 days.  Headache is mainly frontal and associated with pain and pressure over both eyes and both maxillary sinuses.  The nose feels stuffy but she has had no drainage.  Denies any fever.  No photophobia or vomiting.  She has had some increased cough that is worse at nights.  She continues to complain of tiredness and shortness of breath for which Dr. Melvyn Novas has recommended that she see Dr. Nancie Neas in a f/u visit to r/o any cardiac cause.  Pt has had extensive workup in past by pulmonary.  She already has an appointment with cardiology for this Friday.  However she still needs me to submit the referral for insurance purposes.  Patient Active Problem List   Diagnosis Date Noted  . Atrophic vaginitis 04/01/2018  . Diabetes mellitus ([place]) 03/31/2018  . Upper airway cough syndrome 02/08/2018  . Dysphagia 12/14/2017  . 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 Urinary incontinence 07/23/2017  . Early satiety 06/16/2017  . Hx of iron deficiency anemia 05/28/2017  . Throat pain in adult 04/05/2017  . Prediabetes 02/04/2017  . DOE (dyspnea on exertion) 12/23/2016  . Chronic  allergic rhinitis 12/23/2016  . Hemoptysis 12/23/2016  . Fibromyalgia 08/18/2016  . Chronic lymphocytic thyroiditis 08/18/2016  . Depression 04/01/2016  . OSA (obstructive sleep apnea) 04/01/2016  . Essential hypertension 12/09/2015  . Hypothyroidism 12/09/2015  . Morbid obesity due to excess calories ([place]) 12/09/2015  . Constipation 05/27/2015  . Fatty liver 05/27/2015  . Anxiety 06/09/2012  . History of gastroesophageal reflux (GERD) 06/09/2012  . Hyperlipidemia 06/09/2012  . Refusal of blood transfusions as patient is Jehovah's Witness 06/09/2012  . 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 Abnormal small bowel biopsy 09/18/2011  . Lactose intolerance 09/18/2011  . GERD 01/21/2010     Current Outpatient Medications on File Prior to Visit  Medication Sig Dispense Refill  . acetaminophen (TYLENOL) 500 MG tablet Take 1 tablet (500 mg total) by mouth every 6 (six) hours as needed for headache. (Patient taking differently: Take 1,000 mg by mouth 2 (two) times daily. ) 30 tablet 0  . albuterol (PROVENTIL HFA;VENTOLIN HFA) 108 (90 Base) MCG/ACT inhaler Inhale 2 puffs into the lungs every 6 (six) hours as needed for wheezing or shortness of breath. 1 Inhaler 2  . amitriptyline (ELAVIL) 50 MG tablet Take 1 tablet (50 mg total) by mouth at bedtime. 60 tablet 3  . aspirin 81 MG tablet Take 1 tablet (81 mg total) by mouth at bedtime. Reported on 12/09/2015 90 tablet 3  . benzonatate (TESSALON) 200 MG capsule Take 1 capsule (  200 mg total) by mouth 3 (three) times daily as needed for cough. 45 capsule 1  . buPROPion (WELLBUTRIN XL) 300 MG 24 hr tablet Take 1 tablet (300 mg total) by mouth every morning. Reported on 12/09/2015 90 tablet 3  . calcium carbonate (TUMS - DOSED IN MG ELEMENTAL CALCIUM) 500 MG chewable tablet Chew 1 tablet by mouth daily as needed for indigestion or heartburn.    . Cholecalciferol (VITAMIN D3) 5000 units TABS Take by mouth.    . colesevelam (WELCHOL) 625 MG tablet Take by  mouth.    . diclofenac sodium (VOLTAREN) 1 % GEL Apply 2 g topically 4 (four) times daily. 100 g 2  . ferrous sulfate 325 (65 FE) MG tablet TAKE 1 TABLET BY MOUTH 2 TIMES DAILY WITH A MEAL.    . fexofenadine (ALLEGRA) 180 MG tablet Take by mouth.    . fluticasone (FLONASE) 50 MCG/ACT nasal spray Place 2 sprays into both nostrils daily. 16 g 6  . fluticasone (FLOVENT DISKUS) 50 MCG/BLIST diskus inhaler Inhale into the lungs.    . furosemide (LASIX) 40 MG tablet 1/2 to 1 tab daily for lower extremity swelling 30 tablet 6  . gabapentin (NEURONTIN) 300 MG capsule TAKE 2 CAPSULES BY MOUTH 3 TIMES DAILY. 180 capsule 1  . Glucose Blood (BLOOD GLUCOSE TEST STRIPS) STRP Use as directed. One Touch. 100 each 1  . HYDROcodone-acetaminophen (NORCO/VICODIN) 5-325 MG tablet Take 1 tablet by mouth every 12 (twelve) hours.    Marland Kitchen ipratropium (ATROVENT) 0.06 % nasal spray Place into the nose.    . levocetirizine (XYZAL) 5 MG tablet Take by mouth.    . levothyroxine (SYNTHROID, LEVOTHROID) 88 MCG tablet TAKE 1 TABLET BY MOUTH DAILY. 90 tablet 0  . LINZESS 290 MCG CAPS capsule TAKE 1 CAPSULE BY MOUTH DAILY BEFORE BREAKFAST. 30 capsule 5  . meloxicam (MOBIC) 15 MG tablet Take 15 mg by mouth daily.    . metFORMIN (GLUCOPHAGE) 500 MG tablet TAKE 1/2 TABLET DAILY WITH BREAKFAST BY MOUTH. 45 tablet 0  . methocarbamol (ROBAXIN) 500 MG tablet Take 1 tablet (500 mg total) by mouth every 8 (eight) hours as needed for muscle spasms. (Patient taking differently: Take 500 mg by mouth at bedtime as needed for muscle spasms. ) 60 tablet 1  . metoprolol tartrate (LOPRESSOR) 50 MG tablet Take 1 tablet (50 mg total) by mouth 2 (two) times daily. 60 tablet 0  . montelukast (SINGULAIR) 10 MG tablet Take 1 tablet (10 mg total) by mouth at bedtime. 30 tablet 11  . Multiple Vitamin (MULTIVITAMIN) tablet Take 1 tablet by mouth daily. Reported on 12/09/2015 90 tablet 1  . pantoprazole (PROTONIX) 40 MG tablet Take 1 tablet (40 mg total) by  mouth 2 (two) times daily before a meal. 60 tablet 5   No current facility-administered medications on file prior to visit.     Allergies  Allergen Reactions  . Celexa [Citalopram Hydrobromide] Other (See Comments)    "Made me feel out of it"  . Diflucan [Fluconazole] Rash    Social History   Socioeconomic History  . Marital status: Married    Spouse name: Not on file  . Number of children: 4  . Years of education: 10  . Highest education level: Not on file  Occupational History    Employer: UNEMPLOYED  Social Needs  . Financial resource strain: Not on file  . Food insecurity:    Worry: Not on file    Inability: Not on file  .  Transportation needs:    Medical: Not on file    Non-medical: Not on file  Tobacco Use  . Smoking status: Former Smoker    Packs/day: 0.50    Years: 10.00    Pack years: 5.00    Types: Cigarettes    Start date: 07/14/1975    Last attempt to quit: 04/04/1993    Years since quitting: 25.0  . Smokeless tobacco: Never Used  . Tobacco comment: 17 yrs ago  Substance and Sexual Activity  . Alcohol use: No    Alcohol/week: 0.0 oz  . Drug use: No  . Sexual activity: Not on file  Lifestyle  . Physical activity:    Days per week: Not on file    Minutes per session: Not on file  . Stress: Not on file  Relationships  . Social connections:    Talks on phone: Not on file    Gets together: Not on file    Attends religious service: Not on file    Active member of club or organization: Not on file    Attends meetings of clubs or organizations: Not on file    Relationship status: Not on file  . Intimate partner violence:    Fear of current or ex partner: Not on file    Emotionally abused: Not on file    Physically abused: Not on file    Forced sexual activity: Not on file  Other Topics Concern  . Not on file  Social History Narrative   Lives with husband in an apartment on the first floor.  Has 4 children.     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  Trying to get disability.  Formerly worked as a Teacher, early years/pre.  Education: 11th grade.      [facility] (12/23/16):   Originally from [facility]. She was raised in [place]. 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 No pets currently. No bird exposure. She does have mold in her current home in the bathroom, laundry room, & master bedroom.     Family History  Problem Relation Age of Onset  . Kidney cancer Father   . Hypertension Father   . Hyperlipidemia Father   . Sleep apnea Father   . Diabetes Mother   . Hypertension Mother   . Heart Problems Mother   . Hyperlipidemia Mother   . Asthma Mother   . Sleep apnea Mother   . Liver disease Brother   . Arthritis Brother   . Hypertension Sister   . Allergic rhinitis Sister   . Stomach cancer Unknown        aunt  . Breast cancer Maternal Grandmother   . Kidney disease Maternal Grandfather   . Breast cancer Paternal Grandmother   . Hypertension Brother   . Hyperlipidemia Brother   . Hypertension Brother   . Hypertension Sister   . Hyperlipidemia Sister   . Lupus Maternal Aunt   . Colon cancer Neg Hx   . Eczema Neg Hx   . Immunodeficiency Neg Hx   . Urticaria Neg Hx     Past Surgical History:  Procedure Laterality Date  . ABDOMINAL HYSTERECTOMY    . ABDOMINAL HYSTERECTOMY  02/18/2000  . CARDIAC CATHETERIZATION  08/18/2003   normal L main/LAD/L Cfx/RCA (Dr. Adora Fridge)  . COLONOSCOPY  2006   Dr. [name], hyperplastic polyps  . COLONOSCOPY  08/06/11   abnormal terminal ileum for 10cm, erosions, geographical ulceration. Bx small bowel mucosa with prominent intramucosal lymphoid aggregates, slightly inflammed  . DIRECT LARYNGOSCOPY  03/15/2012   Procedure: DIRECT LARYNGOSCOPY;  Surgeon: [name], MD;  Location: [facility];  Service: ENT;  Laterality: N/A; Dr. Wolicki--:>no  foreign body seen. normal esophagus to 40cm  . ESOPHAGEAL DILATION  04/17/2015   Procedure: ESOPHAGEAL DILATION;  Surgeon: [name], MD;  [facility];  Service: Endoscopy;;  . ESOPHAGOGASTRODUODENOSCOPY  08/23/2008   BJY:NWGN distal esophageal erosions consistent with mild erosive reflux esophagitis, otherwise unremarkable esophagus/ Tiny antral erosions of doubtful clinical significance, otherwise normal stomach, patent pylorus, normal D1 and D2  . ESOPHAGOGASTRODUODENOSCOPY  08/06/11   small hh, noncritical Schatzki's ring s/p 31 F  . ESOPHAGOGASTRODUODENOSCOPY N/A 04/17/2015   FAO:ZHYQMV s/p dilation  . ESOPHAGOGASTRODUODENOSCOPY (EGD) WITH PROPOFOL N/A 01/20/2018   Procedure: ESOPHAGOGASTRODUODENOSCOPY (EGD) WITH PROPOFOL;  Surgeon: [name], MD;  [facility];  Service: Endoscopy;  Laterality: N/A;  9:00am  . ESOPHAGOSCOPY  03/15/2012   Procedure: ESOPHAGOSCOPY;  Surgeon: [name], MD;  Location: [facility];  Service: ENT;  Laterality: N/A;  . KNEE ARTHROSCOPY  04/27/2001  . MALONEY DILATION N/A 01/20/2018   Procedure: Venia Minks DILATION;  Surgeon: [name], MD;  [facility];  Service: Endoscopy;  Laterality: N/A;  . [place]  2003   negative bruce protocol exercise tress test; EF 68%; intermediate risk study due to evidence of anterior wall ischemia extending from mid-ventricle to apex  . PITUITARY SURGERY  06/2012   benign tumor, surgeon at [facility]  . RECTOCELE REPAIR    . TRANSTHORACIC ECHOCARDIOGRAM  2003   EF normal  . VAGINAL PROLAPSE REPAIR    . VIDEO BRONCHOSCOPY Bilateral 03/08/2017   Procedure: VIDEO BRONCHOSCOPY WITHOUT FLUORO;  Surgeon: Javier Glazier, MD;  Location: [facility];  Service: Cardiopulmonary;  Laterality: Bilateral;    ROS: Review of Systems Negative except as stated above PHYSICAL EXAM: BP (!) 150/71 (BP Location: Right Arm, Patient Position: Sitting, Cuff Size: Large)   Pulse 64   Temp  98.3 F (36.8 C) (Oral)   Resp 18   Ht 5\' 8"  (1.727 m)   Wt (!) 337 lb (152.9 kg)   SpO2 96%   BMI 51.24 kg/m   Physical Exam General appearance - alert, well appearing, and in no distress.  Mild audible wheezing Mental status - normal mood, behavior, speech, dress, motor activity, and thought processes Eyes-pupils are equal and reactive.  Extraocular movement intact.  No conjunctival injection. Neck - supple, no significant adenopathy Nose -mild enlargement of nasal turbinates. Chest - clear to auscultation, no wheezes, rales or rhonchi, symmetric air entry Heart - normal rate, regular rhythm, normal S1, S2, no murmurs, rubs, clicks or gallops Extremities - trace to 1 + LE edema Neuro: Cranial nerves grossly intact.  Neck is supple.  Power 5 out of 5 bilaterally in upper and lower extremities.  Gross sensation intact.  Gait appears normal. Results for orders placed or performed in visit on 04/11/18  Glucose (CBG)  Result Value Ref Range   POC Glucose 126 (A) 70 - 99 mg/dl    ASSESSMENT AND PLAN: 1. Bilateral leg edema Improved. Other medication that can cause lower extremity edema that she is taking is the gabapentin.  She is on this for fibromyalgia.  We discussed  weaning her off of this and using Cymbalta instead.  Patient is agreeable.  I have discussed and given her written instruction of how to taper herself off of the gabapentin.  When she is on gabapentin 300 mg once a day she will start Cymbalta.  2. Essential hypertension Not at goal.  Increase to 50 mg and will change dosing to twice a day.  Limit salt in the foods. - hydrALAZINE (APRESOLINE) 50 MG tablet; Take 1 tablet (50 mg total) by mouth 2 (two) times daily.  Dispense: 60 tablet; Refill: 5  3. Type 2 diabetes mellitus without complication, unspecified whether long term insulin use ([place]) Was not discussed on today's visit. - Glucose (CBG) - Hemoglobin A1c  4. Acute maxillary sinusitis, recurrence not specified -  amoxicillin-clavulanate (AUGMENTIN) 875-125 MG tablet; Take 1 tablet by mouth 2 (two) times daily.  Dispense: 12 tablet; Refill: 0  5. Acute nonintractable headache, unspecified headache type Questionably due to start of sinus infection.  We will place her on Augmentin.  6. Shortness of breath - Ambulatory referral to Cardiology   Patient was given the opportunity to ask questions.  Patient verbalized understanding of the plan and was able to repeat key elements of the plan.   Orders Placed This Encounter  Procedures  . Hemoglobin A1c  . Ambulatory referral to Cardiology  . Glucose (CBG)     Requested Prescriptions   Signed Prescriptions Disp Refills  . hydrALAZINE (APRESOLINE) 50 MG tablet 60 tablet 5    Sig: Take 1 tablet (50 mg total) by mouth 2 (two) times daily.  Marland Kitchen amoxicillin-clavulanate (AUGMENTIN) 875-125 MG tablet 12 tablet 0    Sig: Take 1 tablet by mouth 2 (two) times daily.    Return in about 7 weeks (around 05/30/2018).  Karle Plumber, MD, FACP

## 2018-04-12 ENCOUNTER — Telehealth: Payer: Self-pay | Admitting: *Deleted

## 2018-04-12 LAB — HEMOGLOBIN A1C
ESTIMATED AVERAGE GLUCOSE: 120 mg/dL
HEMOGLOBIN A1C: 5.8 % — AB (ref 4.8–5.6)

## 2018-04-12 NOTE — Telephone Encounter (Signed)
Notes recorded by Carilyn Goodpasture, RN on 04/12/2018 at 11:04 AM EDT Left message on voicemail to return call. ------  Notes recorded by Ladell Pier, MD on 04/12/2018 at 9:19 AM EDT Let pt know that her A1C is still in range for pre-DM.

## 2018-04-14 MED FILL — LINZESS 290 MCG CAPSULE: 290 | 30 days supply | Qty: 30 | Fill #5

## 2018-04-15 ENCOUNTER — Ambulatory Visit: Payer: Medicaid Other | Admitting: Cardiovascular Disease

## 2018-04-15 ENCOUNTER — Encounter: Payer: Self-pay | Admitting: Cardiovascular Disease

## 2018-04-15 DIAGNOSIS — G4733 Obstructive sleep apnea (adult) (pediatric): Secondary | ICD-10-CM | POA: Diagnosis not present

## 2018-04-15 DIAGNOSIS — R0609 Other forms of dyspnea: Secondary | ICD-10-CM

## 2018-04-15 LAB — BASIC METABOLIC PANEL WITH GFR
BUN/Creatinine Ratio: 13 (ref 9–23)
BUN: 11 mg/dL (ref 6–24)
CO2: 26 mmol/L (ref 20–29)
Calcium: 9.4 mg/dL (ref 8.7–10.2)
Chloride: 101 mmol/L (ref 96–106)
Creatinine, Ser: 0.86 mg/dL (ref 0.57–1.00)
GFR calc Af Amer: 86 mL/min/{1.73_m2}
GFR calc non Af Amer: 74 mL/min/{1.73_m2}
Glucose: 95 mg/dL (ref 65–99)
Potassium: 4.3 mmol/L (ref 3.5–5.2)
Sodium: 141 mmol/L (ref 134–144)

## 2018-04-15 NOTE — Assessment & Plan Note (Signed)
[name] was referred to me by Karle Plumber, her PCP for increasing dyspnea on exertion.  Risk factors include treated hypertension and diabetes as well as hyperlipidemia.  I did do a heart catheterization on her 08/17/2002 because of chest fullness and shortness of breath with an abnormal Cardiolite suggesting anterior ischemia versus breast attenuation artifact.  Coronary arteries were entirely normal as was her LV function.  A 2D echo performed as recently as 09/29/2016 showed normal LV systolic function.  She is had progressive dyspnea on exertion for unclear reasons.  I am going to get a coronary CTA to rule out an ischemic etiology.  Given her prior history of breast attenuation and given her body habitus I do not think that stress testing would be beneficial at this time.

## 2018-04-15 NOTE — Patient Instructions (Signed)
Medication Instructions:  Your physician recommends that you continue on your current medications as directed. Please refer to the Current Medication list given to you today.   Labwork: Your physician recommends that you return for lab work in: TODAY   Testing/Procedures: Non-Cardiac CT Angiography (CTA), is a special type of CT scan that uses a computer to produce multi-dimensional views of major blood vessels throughout the body. In CT angiography, a contrast material is injected through an IV to help visualize the blood vessels   Follow-Up: Follow up with Dr. Gwenlyn Found as needed or pending results of your testing.  Any Other Special Instructions Will Be Listed Below (If Applicable).     If you need a refill on your cardiac medications before your next appointment, please call your pharmacy.

## 2018-04-15 NOTE — Assessment & Plan Note (Signed)
History of obstructive sleep apnea on CPAP. 

## 2018-04-15 NOTE — Progress Notes (Signed)
04/15/2018 Robin [name]   [birth year]  [id number]  Primary Physician Ladell Pier, MD Primary Cardiologist: Lorretta Harp MD [name], [place]  HPI:  Robin [name] is a 62 y.o. morbidly overweight married African-American female mother of 75, grandmother of 7 grandchildren who is accompanied by her husband Robin [name].  She was referred by her PCP, Dr. Karle Plumber, for increasing dyspnea on exertion of unclear etiology.  I performed cardiac catheterization on her 08/17/2002 for chest pain, shortness of breath and a false positive Cardiolite suggesting anterior ischemia versus breast attenuation.  Her coronary arteries were completely normal as was her LV function.  She is noticed increasing dyspnea on exertion over the last several months to years.  She has gained a significant amount of weight as well however.  She is being evaluated by Dr. Melvyn Novas for pulmonary etiology.   Current Meds  Medication Sig  . acetaminophen (TYLENOL) 500 MG tablet Take 1 tablet (500 mg total) by mouth every 6 (six) hours as needed for headache. (Patient taking differently: Take 1,000 mg by mouth 2 (two) times daily. )  . albuterol (PROVENTIL HFA;VENTOLIN HFA) 108 (90 Base) MCG/ACT inhaler Inhale 2 puffs into the lungs every 6 (six) hours as needed for wheezing or shortness of breath.  Marland Kitchen amitriptyline (ELAVIL) 50 MG tablet Take 1 tablet (50 mg total) by mouth at bedtime.  Marland Kitchen amoxicillin-clavulanate (AUGMENTIN) 875-125 MG tablet Take 1 tablet by mouth 2 (two) times daily.  Marland Kitchen aspirin 81 MG tablet Take 1 tablet (81 mg total) by mouth at bedtime. Reported on 12/09/2015  . benzonatate (TESSALON) 200 MG capsule Take 1 capsule (200 mg total) by mouth 3 (three) times daily as needed for cough.  Marland Kitchen buPROPion (WELLBUTRIN XL) 300 MG 24 hr tablet Take 1 tablet (300 mg total) by mouth every morning. Reported on 12/09/2015  . calcium carbonate (TUMS - DOSED IN MG ELEMENTAL CALCIUM) 500 MG chewable tablet Chew 1 tablet by  mouth daily as needed for indigestion or heartburn.  . Cholecalciferol (VITAMIN D3) 5000 units TABS Take by mouth.  . colesevelam (WELCHOL) 625 MG tablet Take by mouth.  . diclofenac sodium (VOLTAREN) 1 % GEL Apply 2 g topically 4 (four) times daily.  . DULoxetine (CYMBALTA) 30 MG capsule Take 1 capsule (30 mg total) by mouth daily.  . ferrous sulfate 325 (65 FE) MG tablet TAKE 1 TABLET BY MOUTH 2 TIMES DAILY WITH A MEAL.  . fexofenadine (ALLEGRA) 180 MG tablet Take by mouth.  . fluticasone (FLONASE) 50 MCG/ACT nasal spray Place 2 sprays into both nostrils daily.  . fluticasone (FLOVENT DISKUS) 50 MCG/BLIST diskus inhaler Inhale into the lungs.  . furosemide (LASIX) 40 MG tablet 1/2 to 1 tab daily for lower extremity swelling  . gabapentin (NEURONTIN) 300 MG capsule TAKE 2 CAPSULES BY MOUTH 3 TIMES DAILY.  Marland Kitchen Glucose Blood (BLOOD GLUCOSE TEST STRIPS) STRP Use as directed. One Touch.  . hydrALAZINE (APRESOLINE) 50 MG tablet Take 1 tablet (50 mg total) by mouth 2 (two) times daily.  Marland Kitchen HYDROcodone-acetaminophen (NORCO/VICODIN) 5-325 MG tablet Take 1 tablet by mouth every 12 (twelve) hours.  Marland Kitchen ipratropium (ATROVENT) 0.06 % nasal spray Place into the nose.  . levocetirizine (XYZAL) 5 MG tablet Take by mouth.  . levothyroxine (SYNTHROID, LEVOTHROID) 88 MCG tablet TAKE 1 TABLET BY MOUTH DAILY.  Marland Kitchen LINZESS 290 MCG CAPS capsule TAKE 1 CAPSULE BY MOUTH DAILY BEFORE BREAKFAST.  . meloxicam (MOBIC) 15 MG tablet Take 15  mg by mouth daily.  . metFORMIN (GLUCOPHAGE) 500 MG tablet TAKE 1/2 TABLET DAILY WITH BREAKFAST BY MOUTH.  . methocarbamol (ROBAXIN) 500 MG tablet Take 1 tablet (500 mg total) by mouth every 8 (eight) hours as needed for muscle spasms. (Patient taking differently: Take 500 mg by mouth at bedtime as needed for muscle spasms. )  . metoprolol tartrate (LOPRESSOR) 50 MG tablet Take 1 tablet (50 mg total) by mouth 2 (two) times daily.  . montelukast (SINGULAIR) 10 MG tablet Take 1 tablet (10 mg  total) by mouth at bedtime.  . Multiple Vitamin (MULTIVITAMIN) tablet Take 1 tablet by mouth daily. Reported on 12/09/2015  . pantoprazole (PROTONIX) 40 MG tablet Take 1 tablet (40 mg total) by mouth 2 (two) times daily before a meal.     Allergies  Allergen Reactions  . Celexa [Citalopram Hydrobromide] Other (See Comments)    "Made me feel out of it"  . Diflucan [Fluconazole] Rash    Social History   Socioeconomic History  . Marital status: Married    Spouse name: Not on file  . Number of children: 4  . Years of education: 10  . Highest education level: Not on file  Occupational History    Employer: UNEMPLOYED  Social Needs  . Financial resource strain: Not on file  . Food insecurity:    Worry: Not on file    Inability: Not on file  . Transportation needs:    Medical: Not on file    Non-medical: Not on file  Tobacco Use  . Smoking status: Former Smoker    Packs/day: 0.50    Years: 10.00    Pack years: 5.00    Types: Cigarettes    Start date: 07/14/1975    Last attempt to quit: 04/04/1993    Years since quitting: 25.0  . Smokeless tobacco: Never Used  . Tobacco comment: 17 yrs ago  Substance and Sexual Activity  . Alcohol use: No    Alcohol/week: 0.0 oz  . Drug use: No  . Sexual activity: Not on file  Lifestyle  . Physical activity:    Days per week: Not on file    Minutes per session: Not on file  . Stress: Not on file  Relationships  . Social connections:    Talks on phone: Not on file    Gets together: Not on file    Attends religious service: Not on file    Active member of club or organization: Not on file    Attends meetings of clubs or organizations: Not on file    Relationship status: Not on file  . Intimate partner violence:    Fear of current or ex partner: Not on file    Emotionally abused: Not on file    Physically abused: Not on file    Forced sexual activity: Not on file  Other Topics Concern  . Not on file  Social History Narrative   Lives  with husband in an apartment on the first floor.  Has 4 children.     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  Trying to get disability.  Formerly worked as a Teacher, early years/pre.  Education: 11th grade.      [facility] (12/23/16):   Originally from [facility]. She was raised in [place]. 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 No pets currently. No bird exposure. She does have mold in her current home in the bathroom, laundry room, & master bedroom.      Review of Systems: General: negative for chills, fever, night sweats or weight changes.  Cardiovascular: negative for chest pain, dyspnea on exertion, edema, orthopnea, palpitations, paroxysmal nocturnal dyspnea or shortness of breath Dermatological: negative for rash Respiratory: negative for cough or wheezing Urologic: negative for hematuria Abdominal: negative for nausea, vomiting, diarrhea, bright red blood per rectum, melena, or hematemesis Neurologic: negative for visual changes, syncope, or dizziness All other systems reviewed and are otherwise negative except as noted above.    Blood pressure (!) 154/80, pulse 62, height 5\' 8"  (1.727 m), weight (!) 335 lb (152 kg).  General appearance: alert and no distress Neck: no adenopathy, no carotid bruit, no JVD, supple, symmetrical, trachea midline and thyroid not enlarged, symmetric, no tenderness/mass/nodules Lungs: clear to auscultation bilaterally Heart: regular rate and rhythm, S1, S2 normal, no murmur, click, rub or gallop Extremities: extremities normal, atraumatic, no cyanosis or edema Pulses: 2+ and symmetric Skin: Skin color, texture, turgor normal. No rashes or lesions Neurologic: Alert and oriented X 3, normal strength and tone. Normal symmetric reflexes. Normal coordination and gait  EKG sinus rhythm at 62 with nonspecific ST  and T wave changes.  I personally reviewed this EKG.  ASSESSMENT AND PLAN:   Dyspnea on exertion Ms. Lessner was referred to me by Karle Plumber, her PCP for increasing dyspnea on exertion.  Risk factors include treated hypertension and diabetes as well as hyperlipidemia.  I did do a heart catheterization on her 08/17/2002 because of chest fullness and shortness of breath with an abnormal Cardiolite suggesting anterior ischemia versus breast attenuation artifact.  Coronary arteries were entirely normal as was her LV function.  A 2D echo performed as recently as 09/29/2016 showed normal LV systolic function.  She is had progressive dyspnea on exertion for unclear reasons.  I am going to get a coronary CTA to rule out an ischemic etiology.  Given her prior history of breast attenuation and given her body habitus I do not think that stress testing would be beneficial at this time.  OSA (obstructive sleep apnea) History of obstructive sleep apnea on CPAP.      Lorretta Harp MD FACP,FACC,FAHA, [facility] 04/15/2018 10:24 AM

## 2018-04-18 ENCOUNTER — Telehealth: Payer: Self-pay | Admitting: Internal Medicine

## 2018-04-18 NOTE — Telephone Encounter (Signed)
Called and spoke with patient, patient states that her blood pressure medication was changed and her BP is now really high. Patient states that it got as high as 197/90. Patient complains of headache, nausea, light headiness. Patient would like her BP medication to be changed.   MW please advise, thank you.

## 2018-04-18 NOTE — Telephone Encounter (Signed)
Called and spoke with patient, advised her of MW response. Patient verbalized understanding and has been scheduled. Nothing further needed.

## 2018-04-18 NOTE — Telephone Encounter (Signed)
She can take an extra bisoprolol right now and each am  but needs ov with np with all meds in hand as she's on a very complex regimen than needs to be verified/ simplified if possible ASAP

## 2018-04-19 ENCOUNTER — Ambulatory Visit (INDEPENDENT_AMBULATORY_CARE_PROVIDER_SITE_OTHER): Payer: Medicaid Other | Admitting: Adult Health

## 2018-04-19 ENCOUNTER — Encounter: Payer: Self-pay | Admitting: Adult Health

## 2018-04-19 DIAGNOSIS — I10 Essential (primary) hypertension: Secondary | ICD-10-CM

## 2018-04-19 DIAGNOSIS — R05 Cough: Secondary | ICD-10-CM

## 2018-04-19 DIAGNOSIS — R0609 Other forms of dyspnea: Secondary | ICD-10-CM | POA: Diagnosis not present

## 2018-04-19 DIAGNOSIS — R058 Other specified cough: Secondary | ICD-10-CM

## 2018-04-19 MED ORDER — METOPROLOL TARTRATE 75 MG PO TABS: 75.0000 mg | ORAL_TABLET | Freq: Two times a day (BID) | ORAL | 0 refills | Status: DC

## 2018-04-19 MED FILL — METOPROLOL TARTRATE 75 MG T: 75 | 30 days supply | Qty: 60 | Fill #0

## 2018-04-19 NOTE — Progress Notes (Signed)
Chart and office note reviewed in detail  > agree with a/p as outlined    

## 2018-04-19 NOTE — Addendum Note (Signed)
Addended by: Karmen Stabs on: 04/19/2018 02:18 PM   Modules accepted: Orders

## 2018-04-19 NOTE — Assessment & Plan Note (Signed)
Improved with cough control and trigger prevention   Plan  Patient Instructions  Increase Metoprolol 75mg  Twice daily  .  Low salt diet  Follow up PCP in 1 month for blood pressure management. Keep b/p log daily in am at rest , call if b/p is >160. Or <90  Continue on Delsym 2 tsp twice daily for cough As needed   Follow med calendar closely and bring to each visit.  Work on weight loss.  GERD diet .  Follow up with Dr. Melvyn Novas  In 3 months and As needed

## 2018-04-19 NOTE — Assessment & Plan Note (Signed)
Hypertension -episodes of blood pressure not being controlled. Today blood pressure appears well controlled.  However will increase metoprolol to 75 twice daily.  Advised patient to record blood pressure daily keep blood pressure log.  Will need follow-up with primary care physician for further blood pressure management.  Device on weight loss and low-sodium diet. Patient's medications were reviewed today and patient education was given. Computerized medication calendar was adjusted/completed   Plan  Patient Instructions  Increase Metoprolol 75mg  Twice daily  .  Low salt diet  Follow up PCP in 1 month for blood pressure management. Keep b/p log daily in am at rest , call if b/p is >160. Or <90  Continue on Delsym 2 tsp twice daily for cough As needed   Follow med calendar closely and bring to each visit.  Work on weight loss.  GERD diet .  Follow up with Dr. Melvyn Novas  In 3 months and As needed

## 2018-04-19 NOTE — Assessment & Plan Note (Signed)
Suspect is multifactorial with deconditioning morbid obesity.  Patient is following up with cardiology and has an upcoming CT cardiac angiography. Continue on current regimen

## 2018-04-19 NOTE — Progress Notes (Signed)
$'@Patient'u$  ID: Robin [name], female    DOB: June 19, 1956, 62 y.o.   MRN: [id number]  Chief Complaint  Patient presents with  . Follow-up    Referring provider: Ladell Pier, MD  HPI: 62 year old female former smoker seen March 2018 with Dr. [name] for dyspnea, chronic allergic rhinitis and hemoptysis (felt secondary to erosions from tonsils status post bronchoscopy)  TEST  PFT 02/09/17: FVC 2.35 L (77%) FEV1 2.06 L (85%) FEV1/FVC 0.88 FEF 25-75 3.32 L (142%) negative bronchodilator response TLC 3.74 L (68%) RV 71% ERV 28% DLCO corrected 71% 08/22/15: FVC 2.32 L (119%) FEV1 1.99 L (121%) FEV1/FVC 0.85 FEF 25-75 2.59 L (118%)  6MWT 02/16/17: Walked 126 meters / Baseline Sat 99% On RA / Nadir Sat 99% on RA  METHACHOLINE CHALLENGE TEST (02/24/17): Normal bronchial hyperreactivity.  IMAGING BARIUM SWALLOW/ESOPHAGRAM 04/30/17 (per radiologist):Hiatal hernia with mild reflux. Esophageal dysmotility  CT NECK/SOFT TISSUE W/ CONTRAST 04/19/17 (per radiologist):No inflammatory change or foreign body can be seen in the pharynx. Cervical spondylosis. 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  CT CHEST W/O 01/07/17 (previously reviewed by me): No parenchymal nodule, mass, or opacity appreciated. No pleural effusion or thickening. No pericardial effusion. No pathologic mediastinal adenopathy.  CTA CHEST 12/13/15 (previously reviewed by me):No pulmonary emboli. No pleural effusion or thickening. No pericardial effusion. No pathologic mediastinal adenopathy. No parenchymal nodule, mass, or opacification.  CARDIAC TTE (09/29/16): LV normal in size with moderate concentric hypertrophy. EF 55-60% with no regional wall motion abnormalities. Indeterminant diastolic function. LA & RA normal in size. RV normal in size and function. No aortic stenosis or regurgitation. Aortic root normal in size. No mitral stenosis or regurgitation. No  significant pulmonic regurgitation. No significant tricuspid regurgitation. No pericardial effusion.  LEFT HEART CATHETERIZATION (11/13/3):  Aortic systolic pressure 790  Diastolic pressure 90  Left ventricular systolic pressure 240  End-diastolic pressure 27 left main coronary artery normal  Left anterior descending artery normal  Left circumflex coronary artery normal  Right coronary artery dominant and normal   Overall estimated ejection fraction greater than 60% without focal wall motion abnormality  MICROBIOLOGY Endobronchial/Endotracheal Brushing 03/08/17: Rare Haemophilus influenza beta lactamase positive  PATHOLOGY Endobronchial/Endotracheal Brushing 03/08/17: No malignancy. Negative for herpes & HPV.  LABS 05/28/17 BNP: 6.1 ESR: 34 TSH: 1.060 Anti-CCP: 6 RF: <10  02/16/17 IgE: 81 RAST panel: Cockroach 0.2 / D. farinae 0.2 / D. pteronyssinus 0.24   12/23/16 INR: 1.0 PTT: 24.1 Chromatin Ab: <0.2 Smith Ab: <0.2 DS DNA Ab: <1 SSA: <0.2 SSB: <0.2 Anti-CCP: <16 SCL-70: <0.2 ENA RNP Ab: Centromere Ab Screen: <0.2 Jo-1 Ab: <0.2  05/12/16 ANA: Negative Rheumatoid factor: <10  04/19/2018 Follow up :  Cough Robin [name] Patient returns for a 2-week follow-up.  Patient was seen last visit for ongoing cough and shortness of breath.  She recently been changed from losartan and Coreg to bisoprolol.  However insurance did not cover bisoprolol so she was changed over to metoprolol.  She was also recommended to use Delsym and chlor tabs for cough control and postnasal drainage trigger. She says her cough has gotten better.  She still has some shortness of breath.  But has been very inactive and weight is went up 50 to 60 pounds. He was recently seen by cardiology who has a planned a CT cardiac angiography.. Patient says that last week she had an episode of blood pressure being high in the 190s.  She had checked it several times.  Since last week blood  pressure has returned back down.  Last week she did have a headache.  Headache has resolved.  She denies any speech changes arm weakness difficulty swallowing.  Today in the office blood pressure is 132/76. Patient denies any OTC medications or decongestants.  She is on meloxicam daily. We reviewed all her medications organize them into a medication encounter with patient education.  She appears that she is taking her medications correctly.  She denies any increased leg swelling.  Denies chest pain..    Allergies  Allergen Reactions  . Celexa [Citalopram Hydrobromide] Other (See Comments)    "Made me feel out of it"  . Diflucan [Fluconazole] Rash    Immunization History  Administered Date(s) Administered  . Influenza,inj,Quad PF,6+ Mos 08/05/2016, 07/30/2017  . Influenza-Unspecified 07/25/2013, 09/04/2014  . Pneumococcal Polysaccharide-23 08/05/2016  . Tdap 08/05/2016    Past Medical History:  Diagnosis Date  . Anxiety disorder   . Arthritis   . Asthma   . Chronic neck pain   . Constipation   . Depression   . Diabetes mellitus without complication ([place])   . GERD (gastroesophageal reflux disease)   . Hiatal hernia    small  . Hyperlipidemia   . Hypertension   . Hypothyroidism   . Schatzki's ring    non critical  . Sleep apnea    CPAP, Sleep study at [facility]  . Sleep apnea     Tobacco History: Social History   Tobacco Use  Smoking Status Former Smoker  . Packs/day: 0.50  . Years: 10.00  . Pack years: 5.00  . Types: Cigarettes  . Start date: 07/14/1975  . Last attempt to quit: 04/04/1993  . Years since quitting: 25.0  Smokeless Tobacco Never Used  Tobacco Comment   17 yrs ago   Counseling given: Not Answered Comment: 17 yrs ago   Outpatient Medications Prior to Visit  Medication Sig Dispense Refill  . acetaminophen (TYLENOL) 500 MG tablet Take 1 tablet (500 mg total) by mouth every 6 (six) hours as needed for headache. (Patient taking  differently: Take 1,000 mg by mouth 2 (two) times daily. ) 30 tablet 0  . albuterol (PROVENTIL HFA;VENTOLIN HFA) 108 (90 Base) MCG/ACT inhaler Inhale 2 puffs into the lungs every 6 (six) hours as needed for wheezing or shortness of breath. 1 Inhaler 2  . amitriptyline (ELAVIL) 50 MG tablet Take 1 tablet (50 mg total) by mouth at bedtime. 60 tablet 3  . amoxicillin-clavulanate (AUGMENTIN) 875-125 MG tablet Take 1 tablet by mouth 2 (two) times daily. 12 tablet 0  . aspirin 81 MG tablet Take 1 tablet (81 mg total) by mouth at bedtime. Reported on 12/09/2015 90 tablet 3  . benzonatate (TESSALON) 200 MG capsule Take 1 capsule (200 mg total) by mouth 3 (three) times daily as needed for cough. 45 capsule 1  . buPROPion (WELLBUTRIN XL) 300 MG 24 hr tablet Take 1 tablet (300 mg total) by mouth every morning. Reported on 12/09/2015 90 tablet 3  . calcium carbonate (TUMS - DOSED IN MG ELEMENTAL CALCIUM) 500 MG chewable tablet Chew 1 tablet by mouth daily as needed for indigestion or heartburn.    . Cholecalciferol (VITAMIN D3) 5000 units TABS Take by mouth.    . colesevelam (WELCHOL) 625 MG tablet Take by mouth.    . diclofenac sodium (VOLTAREN) 1 % GEL Apply 2 g topically 4 (four) times daily. 100 g 2  . DULoxetine (CYMBALTA) 30 MG capsule  Take 1 capsule (30 mg total) by mouth daily. 30 capsule 3  . ferrous sulfate 325 (65 FE) MG tablet TAKE 1 TABLET BY MOUTH 2 TIMES DAILY WITH A MEAL.    . fexofenadine (ALLEGRA) 180 MG tablet Take by mouth.    . fluticasone (FLONASE) 50 MCG/ACT nasal spray Place 2 sprays into both nostrils daily. 16 g 6  . fluticasone (FLOVENT DISKUS) 50 MCG/BLIST diskus inhaler Inhale into the lungs.    . furosemide (LASIX) 40 MG tablet 1/2 to 1 tab daily for lower extremity swelling 30 tablet 6  . gabapentin (NEURONTIN) 300 MG capsule TAKE 2 CAPSULES BY MOUTH 3 TIMES DAILY. 180 capsule 1  . Glucose Blood (BLOOD GLUCOSE TEST STRIPS) STRP Use as directed. One Touch. 100 each 1  . hydrALAZINE  (APRESOLINE) 50 MG tablet Take 1 tablet (50 mg total) by mouth 2 (two) times daily. 60 tablet 5  . HYDROcodone-acetaminophen (NORCO/VICODIN) 5-325 MG tablet Take 1 tablet by mouth every 12 (twelve) hours.    Marland Kitchen ipratropium (ATROVENT) 0.06 % nasal spray Place into the nose.    . levocetirizine (XYZAL) 5 MG tablet Take by mouth.    . levothyroxine (SYNTHROID, LEVOTHROID) 88 MCG tablet TAKE 1 TABLET BY MOUTH DAILY. 90 tablet 0  . LINZESS 290 MCG CAPS capsule TAKE 1 CAPSULE BY MOUTH DAILY BEFORE BREAKFAST. 30 capsule 5  . meloxicam (MOBIC) 15 MG tablet Take 15 mg by mouth daily.    . metFORMIN (GLUCOPHAGE) 500 MG tablet TAKE 1/2 TABLET DAILY WITH BREAKFAST BY MOUTH. 45 tablet 0  . methocarbamol (ROBAXIN) 500 MG tablet Take 1 tablet (500 mg total) by mouth every 8 (eight) hours as needed for muscle spasms. (Patient taking differently: Take 500 mg by mouth at bedtime as needed for muscle spasms. ) 60 tablet 1  . metoprolol tartrate (LOPRESSOR) 50 MG tablet Take 1 tablet (50 mg total) by mouth 2 (two) times daily. 60 tablet 0  . montelukast (SINGULAIR) 10 MG tablet Take 1 tablet (10 mg total) by mouth at bedtime. 30 tablet 11  . Multiple Vitamin (MULTIVITAMIN) tablet Take 1 tablet by mouth daily. Reported on 12/09/2015 90 tablet 1  . pantoprazole (PROTONIX) 40 MG tablet Take 1 tablet (40 mg total) by mouth 2 (two) times daily before a meal. 60 tablet 5   No facility-administered medications prior to visit.      Review of Systems  Constitutional:   No  weight loss, night sweats,  Fevers, chills,  +fatigue, or  lassitude.  HEENT:   No headaches,  Difficulty swallowing,  Tooth/dental problems, or  Sore throat,                No sneezing, itching, ear ache,  +nasal congestion, post nasal drip,   CV:  No chest pain,  Orthopnea, PND, swelling in lower extremities, anasarca, dizziness, palpitations, syncope.   GI  No heartburn, indigestion, abdominal pain, nausea, vomiting, diarrhea, change in bowel  habits, loss of appetite, bloody stools.   Resp:    No excess mucus, no productive cough,  No non-productive cough,  No coughing up of blood.  No change in color of mucus.  No wheezing.  No chest wall deformity  Skin: no rash or lesions.  GU: no dysuria, change in color of urine, no urgency or frequency.  No flank pain, no hematuria   MS:  No joint pain or swelling.  No decreased range of motion.  No back pain.    Physical Exam  BP 132/76 (BP Location: Left Arm, Cuff Size: Normal)   Pulse 75   Ht '5\' 8"'$  (1.727 m)   Wt (!) 336 lb (152.4 kg)   SpO2 98%   BMI 51.09 kg/m   GEN: A/Ox3; pleasant , NAD, obese    HEENT:  [place]/AT,  EACs-clear, TMs-wnl, NOSE-clear, THROAT-clear, no lesions, no postnasal drip or exudate noted.   NECK:  Supple w/ fair ROM; no JVD; normal carotid impulses w/o bruits; no thyromegaly or nodules palpated; no lymphadenopathy.    RESP  Clear  P & A; w/o, wheezes/ rales/ or rhonchi. no accessory muscle use, no dullness to percussion  CARD:  RRR, no m/r/g,tr  peripheral edema, pulses intact, no cyanosis or clubbing.  GI:   Soft & nt; nml bowel sounds; no organomegaly or masses detected.   Musco: Warm bil, no deformities or joint swelling noted.   Neuro: alert, no focal deficits noted.    Skin: Warm, no lesions or rashes    Lab Results:  CBC    Component Value Date/Time   WBC 6.8 03/08/2018 1137   RBC 4.23 03/08/2018 1137   HGB 11.7 (L) 03/08/2018 1137   HGB 12.0 01/20/2017 1001   HCT 36.9 03/08/2018 1137   HCT 38.2 01/20/2017 1001   HCT 41 06/25/2011 1053   PLT 362.0 03/08/2018 1137   PLT 356 01/20/2017 1001   MCV 87.4 03/08/2018 1137   MCV 90 01/20/2017 1001   MCV 91.1 06/25/2011 1053   MCH 27.1 01/17/2018 1422   MCHC 31.6 03/08/2018 1137   RDW 14.2 03/08/2018 1137   RDW 13.1 01/20/2017 1001   LYMPHSABS 3.4 03/08/2018 1137   LYMPHSABS 3.4 (H) 01/20/2017 1001   MONOABS 1.0 03/08/2018 1137   EOSABS 0.1 03/08/2018 1137   EOSABS 0.1  01/20/2017 1001   BASOSABS 0.1 03/08/2018 1137   BASOSABS 0.0 01/20/2017 1001    BMET    Component Value Date/Time   NA 141 04/15/2018 1040   K 4.3 04/15/2018 1040   K 4.2 06/25/2011 1112   CL 101 04/15/2018 1040   CO2 26 04/15/2018 1040   GLUCOSE 95 04/15/2018 1040   GLUCOSE 109 (H) 03/08/2018 1137   BUN 11 04/15/2018 1040   CREATININE 0.86 04/15/2018 1040   CREATININE 0.80 09/22/2016 1539   CALCIUM 9.4 04/15/2018 1040   CALCIUM 9.4 06/25/2011 1112   GFRNONAA 74 04/15/2018 1040   GFRNONAA 82 09/22/2016 1539   GFRAA 86 04/15/2018 1040   GFRAA >89 09/22/2016 1539    BNP    Component Value Date/Time   BNP 4.4 01/27/2018 1114   BNP 8.8 09/22/2016 1539    ProBNP    Component Value Date/Time   PROBNP 25.0 03/08/2018 1137    Imaging: No results found.   Assessment & Plan:   Essential hypertension Hypertension -episodes of blood pressure not being controlled. Today blood pressure appears well controlled.  However will increase metoprolol to 75 twice daily.  Advised patient to record blood pressure daily keep blood pressure log.  Will need follow-up with primary care physician for further blood pressure management.  Device on weight loss and low-sodium diet. Patient's medications were reviewed today and patient education was given. Computerized medication calendar was adjusted/completed   Plan  Patient Instructions  Increase Metoprolol '75mg'$  Twice daily  .  Low salt diet  Follow up PCP in 1 month for blood pressure management. Keep b/p log daily in am at rest , call if b/p is >160. Or <90  Continue  on Delsym 2 tsp twice daily for cough As needed   Follow med calendar closely and bring to each visit.  Work on weight loss.  GERD diet .  Follow up with Dr. Melvyn Novas  In 3 months and As needed       Upper airway cough syndrome Improved with cough control and trigger prevention   Plan  Patient Instructions  Increase Metoprolol '75mg'$  Twice daily  .  Low salt diet    Follow up PCP in 1 month for blood pressure management. Keep b/p log daily in am at rest , call if b/p is >160. Or <90  Continue on Delsym 2 tsp twice daily for cough As needed   Follow med calendar closely and bring to each visit.  Work on weight loss.  GERD diet .  Follow up with Dr. Melvyn Novas  In 3 months and As needed       Dyspnea on exertion Suspect is multifactorial with deconditioning morbid obesity.  Patient is following up with cardiology and has an upcoming CT cardiac angiography. Continue on current regimen     Rexene Edison, NP 04/19/2018

## 2018-04-19 NOTE — Patient Instructions (Addendum)
Increase Metoprolol 75mg  Twice daily  .  Low salt diet  Follow up PCP in 1 month for blood pressure management. Keep b/p log daily in am at rest , call if b/p is >160. Or <90  Continue on Delsym 2 tsp twice daily for cough As needed   Follow med calendar closely and bring to each visit.  Work on weight loss.  GERD diet .  Follow up with Dr. Melvyn Novas  In 3 months and As needed

## 2018-04-20 NOTE — Telephone Encounter (Signed)
Pt name and DOB verified. Pt aware of results and results note. Encouraged better diet intake and increase activity. Pt verbalized understanding.

## 2018-04-21 ENCOUNTER — Encounter: Payer: Medicaid Other | Admitting: Adult Health

## 2018-04-22 MED FILL — PANTOPRAZOLE SOD DR 40 MG T: 40 | 30 days supply | Qty: 60 | Fill #4

## 2018-04-22 MED FILL — BUPROPION HCL XL 300 MG TAB: 300 | 30 days supply | Qty: 30 | Fill #6

## 2018-04-22 MED FILL — BENZONATATE 100 MG CAP: 100 | 30 days supply | Qty: 90 | Fill #1

## 2018-04-22 MED FILL — metFORMIN HCL 500 MG TABS: 500 | 30 days supply | Qty: 15 | Fill #2

## 2018-04-22 NOTE — Addendum Note (Signed)
Addended by: Parke Poisson E on: 04/22/2018 12:31 PM   Modules accepted: Orders

## 2018-04-26 ENCOUNTER — Ambulatory Visit: Payer: Medicaid Other | Admitting: Gastroenterology

## 2018-04-26 ENCOUNTER — Encounter: Payer: Self-pay | Admitting: Gastroenterology

## 2018-04-26 VITALS — BP 150/78 | HR 68 | Temp 97.4°F | Ht 68.0 in | Wt 330.6 lb

## 2018-04-26 DIAGNOSIS — K2970 Gastritis, unspecified, without bleeding: Secondary | ICD-10-CM

## 2018-04-26 DIAGNOSIS — K760 Fatty (change of) liver, not elsewhere classified: Secondary | ICD-10-CM | POA: Diagnosis not present

## 2018-04-26 DIAGNOSIS — K219 Gastro-esophageal reflux disease without esophagitis: Secondary | ICD-10-CM

## 2018-04-26 DIAGNOSIS — K2990 Gastroduodenitis, unspecified, without bleeding: Secondary | ICD-10-CM

## 2018-04-26 MED ORDER — RABEPRAZOLE SODIUM 20 MG PO TBEC: 20.0000 mg | DELAYED_RELEASE_TABLET | Freq: Two times a day (BID) | ORAL | 3 refills | Status: DC

## 2018-04-26 MED FILL — RABEPRAZOLE SOD DR 20 MG TA: 20 | 30 days supply | Qty: 60 | Fill #0

## 2018-04-26 NOTE — Patient Instructions (Addendum)
1. Stop pantoprazole. 2. Start rabeprazole 20mg  twice daily 30 minutes before breakfast and evening meal. IF NOT COVERED OR TOO EXPENSIVE, PLEASE CALL. 3. Call and let me know how you are doing in 1-2 weeks.  4. Go home and take your blood pressure medication! Let you cardiologist know if it remains elevated above 150/90.  5. Return to the office in six months.  6. Continue to work at weight loss. It will benefit you in so many ways! It will also help your reflux and fatty liver.    Food Choices for Gastroesophageal Reflux Disease, Adult When you have gastroesophageal reflux disease (GERD), the foods you eat and your eating habits are very important. Choosing the right foods can help ease the discomfort of GERD. Consider working with a diet and nutrition specialist (dietitian) to help you make healthy food choices. What general guidelines should I follow? Eating plan  Choose healthy foods low in fat, such as fruits, vegetables, whole grains, low-fat dairy products, and lean meat, fish, and poultry.  Eat frequent, small meals instead of three large meals each day. Eat your meals slowly, in a relaxed setting. Avoid bending over or lying down until 2-3 hours after eating.  Limit high-fat foods such as fatty meats or fried foods.  Limit your intake of oils, butter, and shortening to less than 8 teaspoons each day.  Avoid the following: ? Foods that cause symptoms. These may be different for different people. Keep a food diary to keep track of foods that cause symptoms. ? Alcohol. ? Drinking large amounts of liquid with meals. ? Eating meals during the 2-3 hours before bed.  Cook foods using methods other than frying. This may include baking, grilling, or broiling. Lifestyle   Maintain a healthy weight. Ask your health care provider what weight is healthy for you. If you need to lose weight, work with your health care provider to do so safely.  Exercise for at least 30 minutes on 5 or  more days each week, or as told by your health care provider.  Avoid wearing clothes that fit tightly around your waist and chest.  Do not use any products that contain nicotine or tobacco, such as cigarettes and e-cigarettes. If you need help quitting, ask your health care provider.  Sleep with the head of your bed raised. Use a wedge under the mattress or blocks under the bed frame to raise the head of the bed. What foods are not recommended? The items listed may not be a complete list. Talk with your dietitian about what dietary choices are best for you. Grains Pastries or quick breads with added fat. [place] toast. Vegetables Deep fried vegetables. [place] fries. Any vegetables prepared with added fat. Any vegetables that cause symptoms. For some people this may include tomatoes and tomato products, chili peppers, onions and garlic, and horseradish. Fruits Any fruits prepared with added fat. Any fruits that cause symptoms. For some people this may include citrus fruits, such as oranges, grapefruit, pineapple, and lemons. Meats and other protein foods High-fat meats, such as fatty beef or pork, hot dogs, ribs, ham, sausage, salami and bacon. Fried meat or protein, including fried fish and fried chicken. Nuts and nut butters. Dairy Whole milk and chocolate milk. Sour cream. Cream. Ice cream. Cream cheese. Milk shakes. Beverages Coffee and tea, with or without caffeine. Carbonated beverages. Sodas. Energy drinks. Fruit juice made with acidic fruits (such as orange or grapefruit). Tomato juice. Alcoholic drinks. Fats and oils Butter. Margarine. Shortening.  Ghee. Sweets and desserts Chocolate and cocoa. Donuts. Seasoning and other foods Pepper. Peppermint and spearmint. Any condiments, herbs, or seasonings that cause symptoms. For some people, this may include curry, hot sauce, or vinegar-based salad dressings. Summary  When you have gastroesophageal reflux disease (GERD), food and  lifestyle choices are very important to help ease the discomfort of GERD.  Eat frequent, small meals instead of three large meals each day. Eat your meals slowly, in a relaxed setting. Avoid bending over or lying down until 2-3 hours after eating.  Limit high-fat foods such as fatty meat or fried foods. This information is not intended to replace advice given to you by your health care provider. Make sure you discuss any questions you have with your health care provider. Document Released: 09/21/2005 Document Revised: 09/22/2016 Document Reviewed: 09/22/2016 Elsevier Interactive Patient Education  [employer].

## 2018-04-26 NOTE — Progress Notes (Signed)
Primary Care Physician: Ladell Pier, MD  Primary Gastroenterologist:  Garfield Cornea, MD   Chief Complaint  Patient presents with  . fatty liver    f/u.  Marland Kitchen Gastroesophageal Reflux    f/u. "feels very sick for the past month".  . Nausea    no vomiting    HPI: Robin [name] is a 62 y.o. female here for follow-up of fatty liver/nausea.  She was last seen in March.  He has a history of reflux/nausea, constipation, upper abdominal pain.  Normal gastric emptying study last year.  Esophagram earlier this year ordered by her ENT showed trace reflux, small reducible hiatal hernia in the prone position.  Otherwise esophagus was normal.  Because of multiple upper GI symptoms she underwent an EGD and was noted to have erosive gastropathy, chronic gastritis noted on biopsy with no H. pylori.  Normal-appearing esophagus status post empiric dilaiton.   LFTs normal in 02/2018. Last Hgb A1C 5.8 on 04/11/2018. Liver imaging via CT in 01/2017 unremarkable. U/s early 2018 showed fatty liver. Top of stomach feels like on fire all the time. A lot of nausea. No vomiting. Unrelated to food. No nocturnal symptoms. BM doing well. No melena, brbpr. Eating no significant meat, occasional chicken. Lot of fruits/veggies. Low acidic fruits. Trying to lose weight, lost 6 pounds recently.   Previously reflux poorly controled on Omeprazole BID, ranitidine. She can't remember how Dexilant did.  Pantoprazole BID initially helped but started having recurrent symptoms the last few weeks.   She has had a lot of issues with her BP. Dr. Melvyn Novas changed BP med due to ?cause of cough. Now her BP elevated. Typically at home systolic [id number]. This morning 174/80. In office 190/87, improved on recheck as outlined. She has not taken her medication. Mild headache. No other symptoms.     Current Outpatient Medications  Medication Sig Dispense Refill  . acetaminophen (TYLENOL) 500 MG tablet Per bottle as needed    . albuterol  (PROVENTIL HFA;VENTOLIN HFA) 108 (90 Base) MCG/ACT inhaler Inhale 2 puffs into the lungs every 6 (six) hours as needed for wheezing or shortness of breath. 1 Inhaler 2  . amitriptyline (ELAVIL) 50 MG tablet Take 1 tablet (50 mg total) by mouth at bedtime. 60 tablet 3  . benzonatate (TESSALON) 200 MG capsule Take 1 capsule (200 mg total) by mouth 3 (three) times daily as needed for cough. 45 capsule 1  . buPROPion (WELLBUTRIN XL) 300 MG 24 hr tablet Take 1 tablet (300 mg total) by mouth every morning. Reported on 12/09/2015 90 tablet 3  . chlorpheniramine (CHLOR-TRIMETON) 4 MG tablet 1 tab by mouth daily at bedtime, may have 1 every 4 hours as needed    . dextromethorphan (DELSYM) 30 MG/5ML liquid 2tsp twice daily as needed for cough    . fluticasone (FLONASE) 50 MCG/ACT nasal spray Place 2 sprays into both nostrils daily. 16 g 6  . furosemide (LASIX) 40 MG tablet 1/2 to 1 tab daily for lower extremity swelling 30 tablet 6  . gabapentin (NEURONTIN) 300 MG capsule TAKE 2 CAPSULES BY MOUTH 3 TIMES DAILY. 180 capsule 1  . Glucose Blood (BLOOD GLUCOSE TEST STRIPS) STRP Use as directed. One Touch. 100 each 1  . hydrALAZINE (APRESOLINE) 25 MG tablet Take 25 mg by mouth 2 (two) times daily.    Marland Kitchen HYDROcodone-acetaminophen (NORCO/VICODIN) 5-325 MG tablet Take 1 tablet by mouth every 8 (eight) hours as needed for severe pain.     Marland Kitchen  levothyroxine (SYNTHROID, LEVOTHROID) 88 MCG tablet TAKE 1 TABLET BY MOUTH DAILY. 90 tablet 0  . LINZESS 290 MCG CAPS capsule TAKE 1 CAPSULE BY MOUTH DAILY BEFORE BREAKFAST. 30 capsule 5  . meloxicam (MOBIC) 15 MG tablet Take 15 mg by mouth daily.    . metFORMIN (GLUCOPHAGE) 500 MG tablet TAKE 1/2 TABLET DAILY WITH BREAKFAST BY MOUTH. 45 tablet 0  . Metoprolol Tartrate 75 MG TABS Take 75 mg by mouth 2 (two) times daily. 60 tablet 0  . montelukast (SINGULAIR) 10 MG tablet Take 1 tablet (10 mg total) by mouth at bedtime. 30 tablet 11  . Multiple Vitamin (MULTIVITAMIN) tablet Take 1  tablet by mouth daily. Reported on 12/09/2015 90 tablet 1  . pantoprazole (PROTONIX) 40 MG tablet Take 1 tablet (40 mg total) by mouth 2 (two) times daily before a meal. 60 tablet 5  . sodium chloride (OCEAN) 0.65 % SOLN nasal spray Place 2 sprays into both nostrils every 4 (four) hours as needed for congestion.    . vitamin B-12 (CYANOCOBALAMIN) 1000 MCG tablet Take 1,000 mcg by mouth daily.     No current facility-administered medications for this visit.     Allergies as of 04/26/2018 - Review Complete 04/26/2018  Allergen Reaction Noted  . Celexa [citalopram hydrobromide] Other (See Comments) 05/28/2017  . Diflucan [fluconazole] Rash 02/08/2018    ROS:  General: Negative for anorexia, weight loss, fever, chills, fatigue, weakness. ENT: Negative for hoarseness, difficulty swallowing , nasal congestion. CV: Negative for chest pain, angina, palpitations, dyspnea on exertion, peripheral edema.  Respiratory: Negative for dyspnea at rest, dyspnea on exertion, cough, sputum, wheezing.  GI: See history of present illness. GU:  Negative for dysuria, hematuria, urinary incontinence, urinary frequency, nocturnal urination.  Endo: Negative for unusual weight change.    Physical Examination:   BP (!) 150/78 (BP Location: Left Arm)   Pulse 68   Temp (!) 97.4 F (36.3 C) (Oral)   Ht 5\' 8"  (1.727 m)   Wt (!) 330 lb 9.6 oz (150 kg)   BMI 50.27 kg/m   General: Well-nourished, well-developed in no acute distress.  Eyes: No icterus. Mouth: Oropharyngeal mucosa moist and pink , no lesions erythema or exudate. Abdomen: Bowel sounds are normal, nontender, nondistended, no hepatosplenomegaly or masses, no abdominal bruits or hernia , no rebound or guarding.   Extremities: No lower extremity edema. No clubbing or deformities. Neuro: Alert and oriented x 4   Skin: Warm and dry, no jaundice.   Psych: Alert and cooperative, normal mood and affect.  Labs:  Lab Results  Component Value Date    CREATININE 0.86 04/15/2018   BUN 11 04/15/2018   NA 141 04/15/2018   K 4.3 04/15/2018   CL 101 04/15/2018   CO2 26 04/15/2018   Lab Results  Component Value Date   ALT 20 03/04/2018   AST 18 03/04/2018   ALKPHOS 81 03/04/2018   BILITOT 0.3 03/04/2018   Lab Results  Component Value Date   WBC 6.8 03/08/2018   HGB 11.7 (L) 03/08/2018   HCT 36.9 03/08/2018   MCV 87.4 03/08/2018   PLT 362.0 03/08/2018    Imaging Studies: No results found.

## 2018-04-26 NOTE — Progress Notes (Signed)
cc'ed to pcp °

## 2018-04-26 NOTE — Assessment & Plan Note (Signed)
Possibly related to Mobic.  Continue PPI therapy.

## 2018-04-26 NOTE — Assessment & Plan Note (Signed)
Teeth remain normal.  We will continue to monitor.  Reinforced importance of weight loss.

## 2018-04-26 NOTE — Assessment & Plan Note (Signed)
Poorly controlled.  We will switch pantoprazole to generic AcipHex 20 mg twice daily before a meal.  Reinforced antireflux measures/good food choices.  She will call with a progress report in a couple weeks.

## 2018-05-02 DIAGNOSIS — G894 Chronic pain syndrome: Secondary | ICD-10-CM | POA: Diagnosis not present

## 2018-05-02 DIAGNOSIS — M79672 Pain in left foot: Secondary | ICD-10-CM | POA: Diagnosis not present

## 2018-05-02 DIAGNOSIS — M25561 Pain in right knee: Secondary | ICD-10-CM | POA: Diagnosis not present

## 2018-05-02 DIAGNOSIS — M25552 Pain in left hip: Secondary | ICD-10-CM | POA: Diagnosis not present

## 2018-05-02 DIAGNOSIS — M25551 Pain in right hip: Secondary | ICD-10-CM | POA: Diagnosis not present

## 2018-05-02 DIAGNOSIS — M79641 Pain in right hand: Secondary | ICD-10-CM | POA: Diagnosis not present

## 2018-05-02 DIAGNOSIS — M25562 Pain in left knee: Secondary | ICD-10-CM | POA: Diagnosis not present

## 2018-05-02 DIAGNOSIS — M545 Low back pain: Secondary | ICD-10-CM | POA: Diagnosis not present

## 2018-05-02 DIAGNOSIS — M79671 Pain in right foot: Secondary | ICD-10-CM | POA: Diagnosis not present

## 2018-05-02 MED FILL — AMITRIPTYLINE HCL 50 MG TAB: 50 | 30 days supply | Qty: 30 | Fill #7

## 2018-05-02 MED FILL — FUROSEMIDE 40 MG TAB: 40 | 30 days supply | Qty: 30 | Fill #3

## 2018-05-03 ENCOUNTER — Encounter: Payer: Self-pay | Admitting: Internal Medicine

## 2018-05-03 ENCOUNTER — Ambulatory Visit: Payer: Medicaid Other | Attending: Internal Medicine | Admitting: Internal Medicine

## 2018-05-03 VITALS — BP 154/72 | HR 69 | Temp 98.0°F | Resp 16 | Wt 330.2 lb

## 2018-05-03 DIAGNOSIS — Z7984 Long term (current) use of oral hypoglycemic drugs: Secondary | ICD-10-CM | POA: Diagnosis not present

## 2018-05-03 DIAGNOSIS — E739 Lactose intolerance, unspecified: Secondary | ICD-10-CM | POA: Insufficient documentation

## 2018-05-03 DIAGNOSIS — Z8249 Family history of ischemic heart disease and other diseases of the circulatory system: Secondary | ICD-10-CM | POA: Insufficient documentation

## 2018-05-03 DIAGNOSIS — R7303 Prediabetes: Secondary | ICD-10-CM | POA: Diagnosis not present

## 2018-05-03 DIAGNOSIS — Z833 Family history of diabetes mellitus: Secondary | ICD-10-CM | POA: Insufficient documentation

## 2018-05-03 DIAGNOSIS — G4733 Obstructive sleep apnea (adult) (pediatric): Secondary | ICD-10-CM | POA: Diagnosis not present

## 2018-05-03 DIAGNOSIS — Z7951 Long term (current) use of inhaled steroids: Secondary | ICD-10-CM | POA: Insufficient documentation

## 2018-05-03 DIAGNOSIS — Z791 Long term (current) use of non-steroidal anti-inflammatories (NSAID): Secondary | ICD-10-CM | POA: Insufficient documentation

## 2018-05-03 DIAGNOSIS — M797 Fibromyalgia: Secondary | ICD-10-CM | POA: Insufficient documentation

## 2018-05-03 DIAGNOSIS — K219 Gastro-esophageal reflux disease without esophagitis: Secondary | ICD-10-CM | POA: Diagnosis not present

## 2018-05-03 DIAGNOSIS — F329 Major depressive disorder, single episode, unspecified: Secondary | ICD-10-CM | POA: Insufficient documentation

## 2018-05-03 DIAGNOSIS — I10 Essential (primary) hypertension: Secondary | ICD-10-CM | POA: Diagnosis not present

## 2018-05-03 DIAGNOSIS — R600 Localized edema: Secondary | ICD-10-CM | POA: Diagnosis not present

## 2018-05-03 DIAGNOSIS — F419 Anxiety disorder, unspecified: Secondary | ICD-10-CM | POA: Diagnosis not present

## 2018-05-03 DIAGNOSIS — Z79890 Hormone replacement therapy (postmenopausal): Secondary | ICD-10-CM | POA: Diagnosis not present

## 2018-05-03 DIAGNOSIS — Z79899 Other long term (current) drug therapy: Secondary | ICD-10-CM | POA: Diagnosis not present

## 2018-05-03 DIAGNOSIS — Z825 Family history of asthma and other chronic lower respiratory diseases: Secondary | ICD-10-CM | POA: Insufficient documentation

## 2018-05-03 DIAGNOSIS — Z90710 Acquired absence of both cervix and uterus: Secondary | ICD-10-CM | POA: Insufficient documentation

## 2018-05-03 DIAGNOSIS — Z87891 Personal history of nicotine dependence: Secondary | ICD-10-CM | POA: Diagnosis not present

## 2018-05-03 DIAGNOSIS — Z888 Allergy status to other drugs, medicaments and biological substances status: Secondary | ICD-10-CM | POA: Diagnosis not present

## 2018-05-03 DIAGNOSIS — Z6841 Body Mass Index (BMI) 40.0 and over, adult: Secondary | ICD-10-CM | POA: Insufficient documentation

## 2018-05-03 DIAGNOSIS — Z8051 Family history of malignant neoplasm of kidney: Secondary | ICD-10-CM | POA: Insufficient documentation

## 2018-05-03 DIAGNOSIS — E785 Hyperlipidemia, unspecified: Secondary | ICD-10-CM | POA: Diagnosis not present

## 2018-05-03 DIAGNOSIS — M1990 Unspecified osteoarthritis, unspecified site: Secondary | ICD-10-CM | POA: Diagnosis not present

## 2018-05-03 DIAGNOSIS — Z883 Allergy status to other anti-infective agents status: Secondary | ICD-10-CM | POA: Insufficient documentation

## 2018-05-03 LAB — GLUCOSE, POCT (MANUAL RESULT ENTRY): POC GLUCOSE: 115 mg/dL — AB (ref 70–99)

## 2018-05-03 MED ORDER — HYDRALAZINE HCL 50 MG PO TABS: 50.0000 mg | ORAL_TABLET | Freq: Two times a day (BID) | ORAL | 2 refills | Status: DC

## 2018-05-03 MED FILL — MONTELUKAST SOD 10 MG TAB: 10 | 30 days supply | Qty: 30 | Fill #9

## 2018-05-03 NOTE — Progress Notes (Signed)
Pt states she is having pain all over  

## 2018-05-03 NOTE — Patient Instructions (Signed)
Check your blood pressure once a day about 1 hour after you take your medications.  If your blood pressure continues to run higher than 140/90, the next step would be to increase Hydralazine to 75 mg twice a day.

## 2018-05-03 NOTE — Progress Notes (Signed)
Patient ID: Robin [name], female    DOB: 10/29/1955  MRN: [id number]  CC: Hypertension and Diabetes (pre-diabetes)   Subjective: Robin [name] is a 62 y.o. female who presents for 3 wks f/u HTN and LE edema Her concerns today include:  Patient with history ofHTN, pre-DM,OSA, fibromyalgia, depression, obesity, GERD, hypothyroid, OA of multiple js (hands, knees, shoulders)  LE edema:  Decreased since d/c Gabapentin.  Doing good on Cymbalta.  HTN: Saw cardiologist Dr. Gwenlyn Found since last visit for persistent dyspnea.  He plans to do a cardiac CTA.  They are awaiting approval from her insurance.   She has had some problems with elevated blood pressures since I saw her last.  She saw NP at Dr. Bernadene Person office recently and metoprolol was increased to 75 mg twice a day Checking BID in a.m before taking meds and in evenings.  Got BP 200/100 3 days ago.  Stays in 150-180/80-90 Limiting salt in foods She has her automated blood pressure device with her today so that we can do a comparison check.   Patient Active Problem List   Diagnosis Date Noted  . Gastritis and gastroduodenitis 04/26/2018  . Atrophic vaginitis 04/01/2018  . Diabetes mellitus ([place]) 03/31/2018  . Upper airway cough syndrome 02/08/2018  . Dysphagia 12/14/2017  . 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 Urinary incontinence 07/23/2017  . Early satiety 06/16/2017  . Hx of iron deficiency anemia 05/28/2017  . Throat pain in adult 04/05/2017  . Prediabetes 02/04/2017  . Dyspnea on exertion 12/23/2016  . Chronic allergic rhinitis 12/23/2016  . Hemoptysis 12/23/2016  . Fibromyalgia 08/18/2016  . Chronic lymphocytic thyroiditis 08/18/2016  . Depression 04/01/2016  . OSA (obstructive sleep apnea) 04/01/2016  . Essential hypertension 12/09/2015  . Hypothyroidism 12/09/2015  . Morbid obesity due to excess calories ([place]) 12/09/2015  . Constipation 05/27/2015  . Fatty liver 05/27/2015  . Anxiety 06/09/2012  . History of gastroesophageal  reflux (GERD) 06/09/2012  . Hyperlipidemia 06/09/2012  . Refusal of blood transfusions as patient is Jehovah's Witness 06/09/2012  . 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 Abnormal small bowel biopsy 09/18/2011  . Lactose intolerance 09/18/2011  . GERD 01/21/2010     Current Outpatient Medications on File Prior to Visit  Medication Sig Dispense Refill  . acetaminophen (TYLENOL) 500 MG tablet Per bottle as needed    . albuterol (PROVENTIL HFA;VENTOLIN HFA) 108 (90 Base) MCG/ACT inhaler Inhale 2 puffs into the lungs every 6 (six) hours as needed for wheezing or shortness of breath. 1 Inhaler 2  . amitriptyline (ELAVIL) 50 MG tablet Take 1 tablet (50 mg total) by mouth at bedtime. 60 tablet 3  . benzonatate (TESSALON) 200 MG capsule Take 1 capsule (200 mg total) by mouth 3 (three) times daily as needed for cough. 45 capsule 1  . buPROPion (WELLBUTRIN XL) 300 MG 24 hr tablet Take 1 tablet (300 mg total) by mouth every morning. Reported on 12/09/2015 90 tablet 3  . chlorpheniramine (CHLOR-TRIMETON) 4 MG tablet 1 tab by mouth daily at bedtime, may have 1 every 4 hours as needed    . dextromethorphan (DELSYM) 30 MG/5ML liquid 2tsp twice daily as needed for cough    . fluticasone (FLONASE) 50 MCG/ACT nasal spray Place 2 sprays into both nostrils daily. 16 g 6  . furosemide (LASIX) 40 MG tablet 1/2 to 1 tab daily for lower extremity swelling 30 tablet 6  . Glucose Blood (BLOOD GLUCOSE TEST STRIPS) STRP Use as directed. One Touch. 100 each 1  .  HYDROcodone-acetaminophen (NORCO/VICODIN) 5-325 MG tablet Take 1 tablet by mouth every 8 (eight) hours as needed for severe pain.     Marland Kitchen levothyroxine (SYNTHROID, LEVOTHROID) 88 MCG tablet TAKE 1 TABLET BY MOUTH DAILY. 90 tablet 0  . LINZESS 290 MCG CAPS capsule TAKE 1 CAPSULE BY MOUTH DAILY BEFORE BREAKFAST. 30 capsule 5  . meloxicam (MOBIC) 15 MG tablet Take 15 mg by mouth daily.    . metFORMIN (GLUCOPHAGE) 500 MG tablet TAKE 1/2 TABLET DAILY WITH BREAKFAST  BY MOUTH. 45 tablet 0  . Metoprolol Tartrate 75 MG TABS Take 75 mg by mouth 2 (two) times daily. 60 tablet 0  . montelukast (SINGULAIR) 10 MG tablet Take 1 tablet (10 mg total) by mouth at bedtime. 30 tablet 11  . Multiple Vitamin (MULTIVITAMIN) tablet Take 1 tablet by mouth daily. Reported on 12/09/2015 90 tablet 1  . RABEprazole (ACIPHEX) 20 MG tablet Take 1 tablet (20 mg total) by mouth 2 (two) times daily with a meal. 180 tablet 3  . sodium chloride (OCEAN) 0.65 % SOLN nasal spray Place 2 sprays into both nostrils every 4 (four) hours as needed for congestion.    . vitamin B-12 (CYANOCOBALAMIN) 1000 MCG tablet Take 1,000 mcg by mouth daily.     No current facility-administered medications on file prior to visit.     Allergies  Allergen Reactions  . Celexa [Citalopram Hydrobromide] Other (See Comments)    "Made me feel out of it"  . Gabapentin     Contributed to lower extremity edema  . Norvasc [Amlodipine Besylate]     Edema in lower extremities  . Diflucan [Fluconazole] Rash    Social History   Socioeconomic History  . Marital status: Married    Spouse name: Not on file  . Number of children: 4  . Years of education: 10  . Highest education level: Not on file  Occupational History    Employer: UNEMPLOYED  Social Needs  . Financial resource strain: Not on file  . Food insecurity:    Worry: Not on file    Inability: Not on file  . Transportation needs:    Medical: Not on file    Non-medical: Not on file  Tobacco Use  . Smoking status: Former Smoker    Packs/day: 0.50    Years: 10.00    Pack years: 5.00    Types: Cigarettes    Start date: 07/14/1975    Last attempt to quit: 04/04/1993    Years since quitting: 25.0  . Smokeless tobacco: Never Used  . Tobacco comment: 17 yrs ago  Substance and Sexual Activity  . Alcohol use: No    Alcohol/week: 0.0 oz  . Drug use: No  . Sexual activity: Not on file  Lifestyle  . Physical activity:    Days per week: Not on file     Minutes per session: Not on file  . Stress: Not on file  Relationships  . Social connections:    Talks on phone: Not on file    Gets together: Not on file    Attends religious service: Not on file    Active member of club or organization: Not on file    Attends meetings of clubs or organizations: Not on file    Relationship status: Not on file  . Intimate partner violence:    Fear of current or ex partner: Not on file    Emotionally abused: Not on file    Physically abused: Not on file  Forced sexual activity: Not on file  Other Topics Concern  . Not on file  Social History Narrative   Lives with husband in an apartment on the first floor.  Has 4 children.     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  Trying to get disability.  Formerly worked as a Teacher, early years/pre.  Education: 11th grade.      [facility] (12/23/16):   Originally from [place]. She was raised in [place]. 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 No pets currently. No bird exposure. She does have mold in her current home in the bathroom, laundry room, & master bedroom.     Family History  Problem Relation Age of Onset  . Kidney cancer Father   . Hypertension Father   . Hyperlipidemia Father   . Sleep apnea Father   . Diabetes Mother   . Hypertension Mother   . Heart Problems Mother   . Hyperlipidemia Mother   . Asthma Mother   . Sleep apnea Mother   . Liver disease Brother   . Arthritis Brother   . Hypertension Sister   . Allergic rhinitis Sister   . Stomach cancer Unknown        aunt  . Breast cancer Maternal Grandmother   . Kidney disease Maternal Grandfather   . Breast cancer Paternal Grandmother   . Hypertension Brother   . Hyperlipidemia Brother   . Hypertension Brother   . Hypertension Sister   . Hyperlipidemia Sister   . Lupus Maternal  Aunt   . Colon cancer Neg Hx   . Eczema Neg Hx   . Immunodeficiency Neg Hx   . Urticaria Neg Hx     Past Surgical History:  Procedure Laterality Date  . ABDOMINAL HYSTERECTOMY    . ABDOMINAL HYSTERECTOMY  02/18/2000  . CARDIAC CATHETERIZATION  08/18/2003   normal L main/LAD/L Cfx/RCA (Dr. Adora Fridge)  . COLONOSCOPY  2006   Dr. [name], hyperplastic polyps  . COLONOSCOPY  08/06/11   abnormal terminal ileum for 10cm, erosions, geographical ulceration. Bx small bowel mucosa with prominent intramucosal lymphoid aggregates, slightly inflammed  . DIRECT LARYNGOSCOPY  03/15/2012   Procedure: DIRECT LARYNGOSCOPY;  Surgeon: [name], MD;  Location: [facility];  Service: ENT;  Laterality: N/A; Dr. Wolicki--:>no foreign body seen. normal esophagus to 40cm  . ESOPHAGEAL DILATION  04/17/2015   Procedure: ESOPHAGEAL DILATION;  Surgeon: [name], MD;  [facility];  Service: Endoscopy;;  . ESOPHAGOGASTRODUODENOSCOPY  08/23/2008   AOZ:HYQM distal esophageal erosions consistent with mild erosive reflux esophagitis, otherwise unremarkable esophagus/ Tiny antral erosions of doubtful clinical significance, otherwise normal stomach, patent pylorus, normal D1 and D2  . ESOPHAGOGASTRODUODENOSCOPY  08/06/11   small hh, noncritical Schatzki's ring s/p 13 F  . ESOPHAGOGASTRODUODENOSCOPY N/A 04/17/2015   VHQ:IONGEX s/p dilation  . ESOPHAGOGASTRODUODENOSCOPY (EGD) WITH PROPOFOL N/A 01/20/2018   Dr. [name]: Erosive gastropathy, normal-appearing esophagus status post empiric dilation.  Chronic gastritis, no H. pylori.  . ESOPHAGOSCOPY  03/15/2012   Procedure: ESOPHAGOSCOPY;  Surgeon: [name], MD;  Location: [facility];  Service: ENT;  Laterality: N/A;  . KNEE ARTHROSCOPY  04/27/2001  . MALONEY DILATION N/A 01/20/2018   Procedure: Venia Minks DILATION;  Surgeon: [name], MD;  [facility];  Service: Endoscopy;  Laterality:  N/A;  . [place] WALL MOTION  2003   negative bruce  protocol exercise tress test; EF 68%; intermediate risk study due to evidence of anterior wall ischemia extending from mid-ventricle to apex  . PITUITARY SURGERY  06/2012   benign tumor[facility]  . RECTOCELE REPAIR    . TRANSTHORACIC ECHOCARDIOGRAM  2003   EF normal  . VAGINAL PROLAPSE REPAIR    . VIDEO BRONCHOSCOPY Bilateral 03/08/2017   Procedure: VIDEO BRONCHOSCOPY WITHOUT FLUORO;  Surgeon: Javier Glazier, MD;  Location: [facility];  Service: Cardiopulmonary;  Laterality: Bilateral;    ROS: Review of Systems Negative except as above PHYSICAL EXAM: BP (!) 154/72   Pulse 69   Temp 98 F (36.7 C) (Oral)   Resp 16   Wt (!) 330 lb 3.2 oz (149.8 kg)   SpO2 99%   BMI 50.21 kg/m   Wt Readings from Last 3 Encounters:  05/03/18 (!) 330 lb 3.2 oz (149.8 kg)  04/26/18 (!) 330 lb 9.6 oz (150 kg)  04/19/18 (!) 336 lb (152.4 kg)   My check 140/72; her device 141/72  Physical Exam  General appearance - alert, well appearing, and in no distress Mental status - normal mood, behavior, speech, dress, motor activity, and thought processes Chest - clear to auscultation, no wheezes, rales or rhonchi, symmetric air entry Heart - normal rate, regular rhythm, normal S1, S2, no murmurs, rubs, clicks or gallops Extremities -trace lower extremity edema  Results for orders placed or performed in visit on 05/03/18  POCT glucose (manual entry)  Result Value Ref Range   POC Glucose 115 (A) 70 - 99 mg/dl    ASSESSMENT AND PLAN: 1. Essential hypertension -Blood pressure check on our device and hers yielded similar readings.  Blood pressure today is not bad.  Advised that she check her blood  pressure in the mornings about 1 hour after taking her medications.  Goal is really 130/80 or lower.  She will let me know if she runs consistently higher by calling me back.  The next step would be to increase hydralazine to 75 mg twice a day.  2. Bilateral leg edema Just about resolved having  stopped amlodipine and gabapentin  3. Prediabetes - POCT glucose (manual entry) - Microalbumin / creatinine urine ratio     Patient was given the opportunity to ask questions.  Patient verbalized understanding of the plan and was able to repeat key elements of the plan.   Orders Placed This Encounter  Procedures  . Microalbumin / creatinine urine ratio  . POCT glucose (manual entry)     Requested Prescriptions   Signed Prescriptions Disp Refills  . hydrALAZINE (APRESOLINE) 50 MG tablet 60 tablet 2    Sig: Take 1 tablet (50 mg total) by mouth 2 (two) times daily.    Return in about 3 months (around 08/03/2018).  Karle Plumber, MD, FACP

## 2018-05-04 LAB — MICROALBUMIN / CREATININE URINE RATIO
Creatinine, Urine: 97.4 mg/dL
MICROALB/CREAT RATIO: 3.7 mg/g{creat} (ref 0.0–30.0)
Microalbumin, Urine: 3.6 ug/mL

## 2018-05-09 ENCOUNTER — Telehealth: Payer: Self-pay | Admitting: Internal Medicine

## 2018-05-09 ENCOUNTER — Other Ambulatory Visit: Payer: Self-pay | Admitting: Internal Medicine

## 2018-05-09 MED ORDER — HYDRALAZINE HCL 50 MG PO TABS: 75.0000 mg | ORAL_TABLET | Freq: Two times a day (BID) | ORAL | 4 refills | Status: DC

## 2018-05-09 MED FILL — hydrALAZINE HCL 50 MG TABS: 50 | 20 days supply | Qty: 60 | Fill #0

## 2018-05-09 NOTE — Telephone Encounter (Signed)
Patient called and stated that BP is still running high.

## 2018-05-09 NOTE — Telephone Encounter (Signed)
Contacted pt and went over Dr. Wynetta Emery response and pt is aware

## 2018-05-11 ENCOUNTER — Other Ambulatory Visit: Payer: Self-pay | Admitting: *Deleted

## 2018-05-11 DIAGNOSIS — R0609 Other forms of dyspnea: Principal | ICD-10-CM

## 2018-05-11 MED FILL — DULoxetine HCL 30 MG CPEP: 30 | 30 days supply | Qty: 30 | Fill #1

## 2018-05-12 DIAGNOSIS — M25562 Pain in left knee: Secondary | ICD-10-CM | POA: Diagnosis not present

## 2018-05-18 ENCOUNTER — Telehealth: Payer: Self-pay | Admitting: Cardiovascular Disease

## 2018-05-18 MED ORDER — HYDRALAZINE HCL 50 MG PO TABS: 100.0000 mg | ORAL_TABLET | Freq: Three times a day (TID) | ORAL | 5 refills | Status: DC

## 2018-05-18 MED FILL — hydrALAZINE HCL 50 MG TABS: 50 | 30 days supply | Qty: 180 | Fill #0

## 2018-05-18 NOTE — Telephone Encounter (Signed)
New Message:      Pt c/o BP issue: STAT if pt c/o blurred vision, one-sided weakness or slurred speech  1. What are your last 5 BP readings? 184/83, 184/80, 188/82(this morning), 174/68, 194/80  2. Are you having any other symptoms (ex. Dizziness, headache, blurred vision, passed out)? Headaches, blurred vision  3. What is your BP issue? Pt's states she was put on a new bp medication by her pcp but she is still having issues with her bp

## 2018-05-18 NOTE — Telephone Encounter (Signed)
Patient called with pharmacist's recommendations. She agrees with plan. Rx(s) sent to pharmacy electronically. She is scheduled for HTN clinic OV on 8/22 @ 130pm. She was advised when to see emergency evaluation of acute symptoms

## 2018-05-18 NOTE — Telephone Encounter (Signed)
Patient should visit ER if blurry vision, headaches and/or chest pain associated with high BP. Will need further assessment and acute management.   Recommendation:  1. Increase hydralazine to 100mg  TID 2. Continue hydration and LOW sodium diet 3. Schedule for HTN clinic 1 week follow up

## 2018-05-18 NOTE — Telephone Encounter (Signed)
Returned call to patient of Dr. Gwenlyn Found who c/o elevated BP. Her PCP increased her hydralazine 1 week ago to 75mg  BID and she takes this and metoprolol tartrate 75mg  and lasix as prescribed. She reports her BP today was 188/82 - other readings provided: 184/83, 184/80, 174/68, 194/80. She generally checks her BP only twice daily. See 7/30 PCP for additional BP readings/info. She does c/o of headaches and blurry vision.   Will routed to [facility] for medication assistance - increase hydralazine dose/frequency? Add med?

## 2018-05-24 ENCOUNTER — Other Ambulatory Visit: Payer: Self-pay | Admitting: Adult Health

## 2018-05-24 ENCOUNTER — Other Ambulatory Visit: Payer: Self-pay | Admitting: Internal Medicine

## 2018-05-24 MED FILL — LEVOTHYROXINE 88 MCG TABLET: 88 | 30 days supply | Qty: 30 | Fill #0

## 2018-05-24 MED FILL — metFORMIN HCL 500 MG TABS: 500 | 30 days supply | Qty: 15 | Fill #0

## 2018-05-24 MED FILL — BUPROPION HCL XL 300 MG TAB: 300 | 30 days supply | Qty: 30 | Fill #7

## 2018-05-26 ENCOUNTER — Ambulatory Visit (INDEPENDENT_AMBULATORY_CARE_PROVIDER_SITE_OTHER): Payer: Medicaid Other | Admitting: Pharmacist

## 2018-05-26 VITALS — BP 142/74 | HR 64 | Ht 68.0 in

## 2018-05-26 DIAGNOSIS — F333 Major depressive disorder, recurrent, severe with psychotic symptoms: Secondary | ICD-10-CM | POA: Insufficient documentation

## 2018-05-26 DIAGNOSIS — I10 Essential (primary) hypertension: Secondary | ICD-10-CM

## 2018-05-26 DIAGNOSIS — Z6841 Body Mass Index (BMI) 40.0 and over, adult: Secondary | ICD-10-CM | POA: Diagnosis not present

## 2018-05-26 DIAGNOSIS — F329 Major depressive disorder, single episode, unspecified: Secondary | ICD-10-CM | POA: Diagnosis not present

## 2018-05-26 HISTORY — DX: Major depressive disorder, recurrent, severe with psychotic symptoms: F33.3

## 2018-05-26 MED ORDER — SPIRONOLACTONE 25 MG PO TABS: 25.0000 mg | ORAL_TABLET | Freq: Every day | ORAL | 1 refills | Status: DC

## 2018-05-26 MED FILL — SPIRONOLACTONE 25 MG TABLET: 25 | 30 days supply | Qty: 30 | Fill #0

## 2018-05-26 NOTE — Patient Instructions (Signed)
Return for a  follow up appointment in as needed  Go to the lab in 10-14 days  Check your blood pressure at home daily (if able) and keep record of the readings.  Take your BP meds as follows: *START taking spironolactone 25mg  every morning*   Bring all of your meds, your BP cuff and your record of home blood pressures to your next appointment.  Exercise as you're able, try to walk approximately 30 minutes per day.  Keep salt intake to a minimum, especially watch canned and prepared boxed foods.  Eat more fresh fruits and vegetables and fewer canned items.  Avoid eating in fast food restaurants.    HOW TO TAKE YOUR BLOOD PRESSURE: . Rest 5 minutes before taking your blood pressure. .  Don't smoke or drink caffeinated beverages for at least 30 minutes before. . Take your blood pressure before (not after) you eat. . Sit comfortably with your back supported and both feet on the floor (don't cross your legs). . Elevate your arm to heart level on a table or a desk. . Use the proper sized cuff. It should fit smoothly and snugly around your bare upper arm. There should be enough room to slip a fingertip under the cuff. The bottom edge of the cuff should be 1 inch above the crease of the elbow. . Ideally, take 3 measurements at one sitting and record the average.

## 2018-05-26 NOTE — Progress Notes (Signed)
Patient ID: [name]                 DOB: Dec 08, 1955                      MRN: [id number]     HPI: Robin [name] is a 62 y.o. female referred by Dr. Gwenlyn Found to HTN clinic. PMH includes essential hypertension, OSA, GERD, hypothyroidism, depression, and hyperlipidemia. Patient was diagnosed with hypertension at age 57 and started was on ARB therapy for years. About 6 months ago she developed  persistent cough and BP medication were changed by pulmonologist.  She presents to clinic accompany by her husband. Denies chest pain, increase fatigue or lightheadedness. Reports SOB is greatly improved, LLE also improved, but  increased frequency of headaches to almost every day.  Current HTN meds:  Furosemide 40mg  daily for edema Hydralazine 100mg  TID Metoprolol tartrate 75mg  twice daily  Previously tried:  Amlodipine - low extremity edema Carvedilol - stopper by pulmonologist Bisoprolol - cost issues Losartan - stopped by pulmonologist  BP goal: 130/80  Family History: HTN in mother and father  Social History: former smoker. Stopped smoking and drinking years ago.  Diet: Avoid adding salt to already cooked melas, almost 100% home cooked meals  Exercise: activities of daily living; ambulated with cane  Home BP readings: none provided today for assessment *Home BP machine accurate in pulse and systolic reading; >58KDXI lowe in diastolic reading*  Wt Readings from Last 3 Encounters:  05/03/18 (!) 330 lb 3.2 oz (149.8 kg)  04/26/18 (!) 330 lb 9.6 oz (150 kg)  04/19/18 (!) 336 lb (152.4 kg)   BP Readings from Last 3 Encounters:  05/26/18 (!) 142/74  05/03/18 (!) 154/72  04/26/18 (!) 150/78   Pulse Readings from Last 3 Encounters:  05/26/18 64  05/03/18 69  04/26/18 68     Past Medical History:  Diagnosis Date  . Anxiety disorder   . Arthritis   . Asthma   . Chronic neck pain   . Constipation   . Depression   . Diabetes mellitus without complication ([place])   . GERD  (gastroesophageal reflux disease)   . Hiatal hernia    small  . Hyperlipidemia   . Hypertension   . Hypothyroidism   . Schatzki's ring    non critical  . Sleep apnea    CPAP, Sleep study at [place]  . Sleep apnea     Current Outpatient Medications on File Prior to Visit  Medication Sig Dispense Refill  . acetaminophen (TYLENOL) 500 MG tablet Take 1,000 mg by mouth 2 (two) times daily. Per bottle as needed     . amitriptyline (ELAVIL) 50 MG tablet Take 1 tablet (50 mg total) by mouth at bedtime. 60 tablet 3  . benzonatate (TESSALON) 200 MG capsule Take 1 capsule (200 mg total) by mouth 3 (three) times daily as needed for cough. 45 capsule 1  . buPROPion (WELLBUTRIN XL) 300 MG 24 hr tablet Take 1 tablet (300 mg total) by mouth every morning. Reported on 12/09/2015 90 tablet 3  . chlorpheniramine (CHLOR-TRIMETON) 4 MG tablet 1 tab by mouth daily at bedtime, may have 1 every 4 hours as needed    . furosemide (LASIX) 40 MG tablet 1/2 to 1 tab daily for lower extremity swelling 30 tablet 6  . hydrALAZINE (APRESOLINE) 50 MG tablet Take 2 tablets (100 mg total) by mouth 3 (three) times daily. 180 tablet 5  .  HYDROcodone-acetaminophen (NORCO/VICODIN) 5-325 MG tablet Take 1 tablet by mouth every 8 (eight) hours as needed for severe pain.     Marland Kitchen levothyroxine (SYNTHROID, LEVOTHROID) 88 MCG tablet TAKE 1 TABLET BY MOUTH DAILY. 30 tablet 2  . LINZESS 290 MCG CAPS capsule TAKE 1 CAPSULE BY MOUTH DAILY BEFORE BREAKFAST. 30 capsule 5  . meloxicam (MOBIC) 15 MG tablet Take 15 mg by mouth daily.    . metFORMIN (GLUCOPHAGE) 500 MG tablet TAKE 1/2 TABLET DAILY WITH BREAKFAST BY MOUTH. 15 tablet 2  . Metoprolol Tartrate 75 MG TABS Take 75 mg by mouth 2 (two) times daily. 60 tablet 0  . montelukast (SINGULAIR) 10 MG tablet Take 1 tablet (10 mg total) by mouth at bedtime. 30 tablet 11  . Multiple Vitamin (MULTIVITAMIN) tablet Take 1 tablet by mouth daily. Reported on 12/09/2015 90 tablet 1  .  RABEprazole (ACIPHEX) 20 MG tablet Take 1 tablet (20 mg total) by mouth 2 (two) times daily with a meal. 180 tablet 3  . vitamin B-12 (CYANOCOBALAMIN) 1000 MCG tablet Take 1,000 mcg by mouth daily.    Marland Kitchen albuterol (PROVENTIL HFA;VENTOLIN HFA) 108 (90 Base) MCG/ACT inhaler Inhale 2 puffs into the lungs every 6 (six) hours as needed for wheezing or shortness of breath. (Patient not taking: Reported on 05/26/2018) 1 Inhaler 2  . dextromethorphan (DELSYM) 30 MG/5ML liquid 2tsp twice daily as needed for cough    . fluticasone (FLONASE) 50 MCG/ACT nasal spray Place 2 sprays into both nostrils daily. (Patient not taking: Reported on 05/26/2018) 16 g 6  . Glucose Blood (BLOOD GLUCOSE TEST STRIPS) STRP Use as directed. One Touch. 100 each 1  . sodium chloride (OCEAN) 0.65 % SOLN nasal spray Place 2 sprays into both nostrils every 4 (four) hours as needed for congestion.     No current facility-administered medications on file prior to visit.     Allergies  Allergen Reactions  . Celexa [Citalopram Hydrobromide] Other (See Comments)    "Made me feel out of it"  . Gabapentin     Contributed to lower extremity edema  . Norvasc [Amlodipine Besylate]     Edema in lower extremities  . Diflucan [Fluconazole] Rash    Blood pressure (!) 142/74, pulse 64, height 5\' 8"  (1.727 m).  Essential hypertension Blood pressure improved but remains above goal of <130/80. Patient currently on BB , diuretic and hydralazine for BP management after ACEI and ARBs were discontinued by pulmonologist due to persistent cough. Will start spironolactone 25mg  daily to improve BP control. Patient is to repeat BMET in 10 days to monitor renal function and electrolytes.   Losartan has been reported to cause a similar pattern of non-specific upper airway symptoms as seen with ACEi but this will not rule out the whole drug class; therefore, valsartan may be an alternative if additional BP control is needed during follow up visit.  Robin Aden  [name] PharmD, BCPS, [place] [address] 05/27/2018 9:34 AM

## 2018-05-27 ENCOUNTER — Encounter: Payer: Self-pay | Admitting: Pharmacist

## 2018-05-27 NOTE — Assessment & Plan Note (Addendum)
Blood pressure improved but remains above goal of <130/80. Patient currently on BB , diuretic and hydralazine for BP management after ACEI and ARBs were discontinued by pulmonologist due to persistent cough. Will start spironolactone 25mg  daily to improve BP control. Patient is to repeat BMET in 10 days to monitor renal function and electrolytes.   Losartan has been reported to cause a similar pattern of non-specific upper airway symptoms as seen with ACEi but this will not rule out the whole drug class; therefore, valsartan may be an alternative if additional BP control is needed during follow up visit.

## 2018-05-30 ENCOUNTER — Telehealth: Payer: Self-pay | Admitting: Internal Medicine

## 2018-05-30 ENCOUNTER — Other Ambulatory Visit: Payer: Self-pay

## 2018-05-30 ENCOUNTER — Telehealth (INDEPENDENT_AMBULATORY_CARE_PROVIDER_SITE_OTHER): Payer: Self-pay | Admitting: Orthopaedic Surgery

## 2018-05-30 ENCOUNTER — Ambulatory Visit: Payer: Medicaid Other | Admitting: Internal Medicine

## 2018-05-30 MED ORDER — METOPROLOL TARTRATE 75 MG PO TABS: 75.0000 mg | ORAL_TABLET | Freq: Two times a day (BID) | ORAL | 3 refills | Status: DC

## 2018-05-30 MED FILL — MONTELUKAST SOD 10 MG TAB: 10 | 30 days supply | Qty: 30 | Fill #10

## 2018-05-30 MED FILL — RABEPRAZOLE SOD DR 20 MG TA: 20 | 30 days supply | Qty: 60 | Fill #1

## 2018-05-30 MED FILL — METOPROLOL TARTRATE 75 MG T: 75 | 30 days supply | Qty: 60 | Fill #0

## 2018-05-30 MED FILL — AMLODIPINE BESYLATE 10 MG T: 10 | 30 days supply | Qty: 30 | Fill #6

## 2018-05-30 NOTE — Telephone Encounter (Signed)
Patient waiting in office for CD. DONE.

## 2018-05-30 NOTE — Telephone Encounter (Signed)
Patient requesting x-ray disc of all images taken. Looks like they are all from 2017 - bilateral shoulders, knees, and hips. Patient has filled out medical release form.   Patients # [phone]

## 2018-05-30 NOTE — Telephone Encounter (Signed)
Called and spoke with patient She was wanting a refill on her metoprolol. Pre the last prescription sent in to the pharmacy Tammy Parrett NP advise that she would need to get this filled by her PCP. I advised her to call her PCP and let them know that she would need them to take over this medication.She verbalized understanding and nothing further needed at this time.

## 2018-06-01 DIAGNOSIS — M545 Low back pain: Secondary | ICD-10-CM | POA: Diagnosis not present

## 2018-06-01 DIAGNOSIS — M79672 Pain in left foot: Secondary | ICD-10-CM | POA: Diagnosis not present

## 2018-06-01 DIAGNOSIS — M25561 Pain in right knee: Secondary | ICD-10-CM | POA: Diagnosis not present

## 2018-06-01 DIAGNOSIS — M25562 Pain in left knee: Secondary | ICD-10-CM | POA: Diagnosis not present

## 2018-06-01 DIAGNOSIS — I10 Essential (primary) hypertension: Secondary | ICD-10-CM | POA: Diagnosis not present

## 2018-06-01 DIAGNOSIS — G894 Chronic pain syndrome: Secondary | ICD-10-CM | POA: Diagnosis not present

## 2018-06-02 LAB — BASIC METABOLIC PANEL
BUN/Creatinine Ratio: 14 (ref 9–23)
BUN: 11 mg/dL (ref 6–24)
CO2: 25 mmol/L (ref 20–29)
CREATININE: 0.79 mg/dL (ref 0.57–1.00)
Calcium: 9.8 mg/dL (ref 8.7–10.2)
Chloride: 100 mmol/L (ref 96–106)
GFR calc Af Amer: 95 mL/min/{1.73_m2} (ref >59)
GFR calc non Af Amer: 82 mL/min/{1.73_m2} (ref >59)
Glucose: 75 mg/dL (ref 65–99)
Potassium: 4.9 mmol/L (ref 3.5–5.2)
Sodium: 138 mmol/L (ref 134–144)

## 2018-06-03 ENCOUNTER — Encounter: Payer: Self-pay | Admitting: *Deleted

## 2018-06-07 ENCOUNTER — Ambulatory Visit (INDEPENDENT_AMBULATORY_CARE_PROVIDER_SITE_OTHER): Payer: Medicaid Other | Admitting: Cardiology

## 2018-06-07 ENCOUNTER — Encounter: Payer: Self-pay | Admitting: Cardiology

## 2018-06-07 DIAGNOSIS — I10 Essential (primary) hypertension: Secondary | ICD-10-CM | POA: Diagnosis not present

## 2018-06-07 MED ORDER — AMLODIPINE BESYLATE 5 MG PO TABS: 5.0000 mg | ORAL_TABLET | Freq: Every day | ORAL | 1 refills | Status: DC

## 2018-06-07 NOTE — Patient Instructions (Signed)
Medication Instructions:  Add Amlodipine 5 mg daily.  If you need a refill on your cardiac medications before your next appointment, please call your pharmacy.  Labwork: None Ordered.  Testing/Procedures: None Ordered.  Special Instructions: None Ordered.  Follow-Up: Your physician wants you to follow-up in: 6 months with Dr.Berry. You should receive a reminder letter in the mail two months in advance. If you do not receive a letter, please call our office in January to schedule your follow-up appointment.   Thank you for choosing [facility] at [facility]!!

## 2018-06-07 NOTE — Assessment & Plan Note (Signed)
Fair control- 138/86 by me. ARB stopped, Aldactone added. K+ 4.9. I am hesitant to add another ARB with her K+ being 4.9 on Aldactone. She reportedly had edema with Norvasc but she is not sure it got any better off it.

## 2018-06-07 NOTE — Progress Notes (Signed)
06/07/2018 Robin [name]   11/07/1955  [id number]  Primary Physician Ladell Pier, MD Primary Cardiologist: Dr Gwenlyn Found  HPI:  62 y/o obese AA female with a history of, OSA, HTN, NIDDM, normal coronaries in 2003, and normal LVF by echo in 2017. 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 He ordered a coronary CTA. It has not been done yet. She is in the office for follow up. Her B/P medications were recently adjusted by Pulmonary and our Pharmacist. She has been taken off her ARB (cough) and placed on Aldactone. Her K+ is 4.9.    Current Outpatient Medications  Medication Sig Dispense Refill  . acetaminophen (TYLENOL) 500 MG tablet Take 1,000 mg by mouth 2 (two) times daily. Per bottle as needed     . albuterol (PROVENTIL HFA;VENTOLIN HFA) 108 (90 Base) MCG/ACT inhaler Inhale 2 puffs into the lungs every 6 (six) hours as needed for wheezing or shortness of breath. 1 Inhaler 2  . amitriptyline (ELAVIL) 50 MG tablet Take 1 tablet (50 mg total) by mouth at bedtime. 60 tablet 3  . benzonatate (TESSALON) 200 MG capsule Take 1 capsule (200 mg total) by mouth 3 (three) times daily as needed for cough. 45 capsule 1  . buPROPion (WELLBUTRIN XL) 300 MG 24 hr tablet Take 1 tablet (300 mg total) by mouth every morning. Reported on 12/09/2015 90 tablet 3  . chlorpheniramine (CHLOR-TRIMETON) 4 MG tablet 1 tab by mouth daily at bedtime, may have 1 every 4 hours as needed    . dextromethorphan (DELSYM) 30 MG/5ML liquid 2tsp twice daily as needed for cough    . fluticasone (FLONASE) 50 MCG/ACT nasal spray Place 2 sprays into both nostrils daily. 16 g 6  . furosemide (LASIX) 40 MG tablet 1/2 to 1 tab daily for lower extremity swelling 30 tablet 6  . Glucose Blood (BLOOD GLUCOSE TEST STRIPS) STRP Use as directed. One Touch. 100 each 1  . hydrALAZINE (APRESOLINE) 50 MG tablet Take 2 tablets (100 mg total) by mouth 3 (three) times daily. 180 tablet 5  . HYDROcodone-acetaminophen (NORCO/VICODIN) 5-325 MG tablet Take  1 tablet by mouth every 8 (eight) hours as needed for severe pain.     Marland Kitchen levothyroxine (SYNTHROID, LEVOTHROID) 88 MCG tablet TAKE 1 TABLET BY MOUTH DAILY. 30 tablet 2  . LINZESS 290 MCG CAPS capsule TAKE 1 CAPSULE BY MOUTH DAILY BEFORE BREAKFAST. 30 capsule 5  . meloxicam (MOBIC) 15 MG tablet Take 15 mg by mouth daily.    . metFORMIN (GLUCOPHAGE) 500 MG tablet TAKE 1/2 TABLET DAILY WITH BREAKFAST BY MOUTH. 15 tablet 2  . Metoprolol Tartrate 75 MG TABS Take 75 mg by mouth 2 (two) times daily. 60 tablet 3  . montelukast (SINGULAIR) 10 MG tablet Take 1 tablet (10 mg total) by mouth at bedtime. 30 tablet 11  . Multiple Vitamin (MULTIVITAMIN) tablet Take 1 tablet by mouth daily. Reported on 12/09/2015 90 tablet 1  . RABEprazole (ACIPHEX) 20 MG tablet Take 1 tablet (20 mg total) by mouth 2 (two) times daily with a meal. 180 tablet 3  . sodium chloride (OCEAN) 0.65 % SOLN nasal spray Place 2 sprays into both nostrils every 4 (four) hours as needed for congestion.    Marland Kitchen spironolactone (ALDACTONE) 25 MG tablet Take 1 tablet (25 mg total) by mouth daily. 30 tablet 1  . vitamin B-12 (CYANOCOBALAMIN) 1000 MCG tablet Take 1,000 mcg by mouth daily.    Marland Kitchen amLODipine (NORVASC) 5 MG tablet Take  1 tablet (5 mg total) by mouth daily. 180 tablet 1   No current facility-administered medications for this visit.     Allergies  Allergen Reactions  . Celexa [Citalopram Hydrobromide] Other (See Comments)    "Made me feel out of it"  . Gabapentin     Contributed to lower extremity edema  . Norvasc [Amlodipine Besylate]     Edema in lower extremities  . Diflucan [Fluconazole] Rash    Past Medical History:  Diagnosis Date  . Anxiety disorder   . Arthritis   . Asthma   . Chronic neck pain   . Constipation   . Depression   . Diabetes mellitus without complication ([place])   . GERD (gastroesophageal reflux disease)   . Hiatal hernia    small  . Hyperlipidemia   . Hypertension   . Hypothyroidism   . Schatzki's  ring    non critical  . Sleep apnea    CPAP, Sleep study at [place]  . Sleep apnea     Social History   Socioeconomic History  . Marital status: Married    Spouse name: Not on file  . Number of children: 4  . Years of education: 10  . Highest education level: Not on file  Occupational History    Employer: UNEMPLOYED  Social Needs  . Financial resource strain: Not on file  . Food insecurity:    Worry: Not on file    Inability: Not on file  . Transportation needs:    Medical: Not on file    Non-medical: Not on file  Tobacco Use  . Smoking status: Former Smoker    Packs/day: 0.50    Years: 10.00    Pack years: 5.00    Types: Cigarettes    Start date: 07/14/1975    Last attempt to quit: 04/04/1993    Years since quitting: 25.1  . Smokeless tobacco: Never Used  . Tobacco comment: 17 yrs ago  Substance and Sexual Activity  . Alcohol use: No    Alcohol/week: 0.0 standard drinks  . Drug use: No  . Sexual activity: Not on file  Lifestyle  . Physical activity:    Days per week: Not on file    Minutes per session: Not on file  . Stress: Not on file  Relationships  . Social connections:    Talks on phone: Not on file    Gets together: Not on file    Attends religious service: Not on file    Active member of club or organization: Not on file    Attends meetings of clubs or organizations: Not on file    Relationship status: Not on file  . Intimate partner violence:    Fear of current or ex partner: Not on file    Emotionally abused: Not on file    Physically abused: Not on file    Forced sexual activity: Not on file  Other Topics Concern  . Not on file  Social History Narrative   Lives with husband in an apartment on the first floor.  Has 4 children.     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  Trying to get disability.  Formerly worked as a Teacher, early years/pre.  Education: 11th grade.      [facility] (12/23/16):    Originally from [facility] She was raised in [place]. 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 No pets currently. No bird exposure. She does have mold in her current home in the bathroom, laundry room, & master bedroom.      Family History  Problem Relation Age of Onset  . Kidney cancer Father   . Hypertension Father   . Hyperlipidemia Father   . Sleep apnea Father   . Diabetes Mother   . Hypertension Mother   . Heart Problems Mother   . Hyperlipidemia Mother   . Asthma Mother   . Sleep apnea Mother   . Liver disease Brother   . Arthritis Brother   . Hypertension Sister   . Allergic rhinitis Sister   . Stomach cancer Unknown        aunt  . Breast cancer Maternal Grandmother   . Kidney disease Maternal Grandfather   . Breast cancer Paternal Grandmother   . Hypertension Brother   . Hyperlipidemia Brother   . Hypertension Brother   . Hypertension Sister   . Hyperlipidemia Sister   . Lupus Maternal Aunt   . Colon cancer Neg Hx   . Eczema Neg Hx   . Immunodeficiency Neg Hx   . Urticaria Neg Hx      Review of Systems: General: negative for chills, fever, night sweats or weight changes.  Cardiovascular: negative for chest pain, orthopnea, palpitations, paroxysmal nocturnal dyspnea  Dermatological: negative for rash Respiratory: negative for cough or wheezing Urologic: negative for hematuria Abdominal: negative for nausea, vomiting, diarrhea, bright red blood per rectum, melena, or hematemesis Neurologic: negative for visual changes, syncope, or dizziness All other systems reviewed and are otherwise negative except as noted above.    Blood pressure (!) 158/90, pulse 70, height 5\' 8"  (1.727 m), weight (!) 321 lb 9.6 oz (145.9 kg), SpO2 99 %.  General appearance: alert, cooperative, no distress and morbidly obese Lungs: clear to auscultation  bilaterally Heart: regular rate and rhythm Extremities: trace edema   ASSESSMENT AND PLAN:   Essential hypertension Fair control- 138/86 by me. ARB stopped, Aldactone added. K+ 4.9. I am hesitant to add another ARB with her K+ being 4.9 on Aldactone. She reportedly had edema with Norvasc but she is not sure it got any better off it.   PLAN  Add Norvasc 5 mg. F/U Dr Gwenlyn Found in 4 months.   Kerin Ransom PA-C 06/07/2018 8:46 AM

## 2018-06-08 ENCOUNTER — Other Ambulatory Visit: Payer: Self-pay | Admitting: Nurse Practitioner

## 2018-06-08 ENCOUNTER — Other Ambulatory Visit: Payer: Self-pay | Admitting: Internal Medicine

## 2018-06-08 MED FILL — AMITRIPTYLINE HCL 50 MG TAB: 50 | 30 days supply | Qty: 30 | Fill #0

## 2018-06-09 ENCOUNTER — Ambulatory Visit (HOSPITAL_BASED_OUTPATIENT_CLINIC_OR_DEPARTMENT_OTHER): Payer: Medicaid Other | Admitting: Internal Medicine

## 2018-06-09 ENCOUNTER — Ambulatory Visit (HOSPITAL_COMMUNITY)
Admission: RE | Admit: 2018-06-09 | Discharge: 2018-06-09 | Disposition: A | Discharge status: Home / Self Care | Payer: Medicaid Other | Source: Ambulatory Visit | Attending: Cardiovascular Disease | Admitting: Cardiovascular Disease

## 2018-06-09 ENCOUNTER — Encounter: Payer: Self-pay | Admitting: Internal Medicine

## 2018-06-09 ENCOUNTER — Ambulatory Visit (HOSPITAL_COMMUNITY): Admission: RE | Admit: 2018-06-09 | Payer: Medicaid Other | Source: Ambulatory Visit

## 2018-06-09 VITALS — BP 144/76 | HR 71 | Temp 98.5°F | Resp 16 | Wt 321.4 lb

## 2018-06-09 DIAGNOSIS — Z883 Allergy status to other anti-infective agents status: Secondary | ICD-10-CM | POA: Diagnosis not present

## 2018-06-09 DIAGNOSIS — Z87891 Personal history of nicotine dependence: Secondary | ICD-10-CM | POA: Diagnosis not present

## 2018-06-09 DIAGNOSIS — I10 Essential (primary) hypertension: Secondary | ICD-10-CM | POA: Insufficient documentation

## 2018-06-09 DIAGNOSIS — E119 Type 2 diabetes mellitus without complications: Secondary | ICD-10-CM | POA: Insufficient documentation

## 2018-06-09 DIAGNOSIS — Z7951 Long term (current) use of inhaled steroids: Secondary | ICD-10-CM | POA: Insufficient documentation

## 2018-06-09 DIAGNOSIS — Z825 Family history of asthma and other chronic lower respiratory diseases: Secondary | ICD-10-CM | POA: Insufficient documentation

## 2018-06-09 DIAGNOSIS — Z6841 Body Mass Index (BMI) 40.0 and over, adult: Secondary | ICD-10-CM | POA: Insufficient documentation

## 2018-06-09 DIAGNOSIS — K760 Fatty (change of) liver, not elsewhere classified: Secondary | ICD-10-CM | POA: Diagnosis not present

## 2018-06-09 DIAGNOSIS — R079 Chest pain, unspecified: Secondary | ICD-10-CM | POA: Diagnosis not present

## 2018-06-09 DIAGNOSIS — Z8639 Personal history of other endocrine, nutritional and metabolic disease: Secondary | ICD-10-CM | POA: Insufficient documentation

## 2018-06-09 DIAGNOSIS — M159 Polyosteoarthritis, unspecified: Secondary | ICD-10-CM | POA: Diagnosis not present

## 2018-06-09 DIAGNOSIS — Z90710 Acquired absence of both cervix and uterus: Secondary | ICD-10-CM | POA: Diagnosis not present

## 2018-06-09 DIAGNOSIS — K219 Gastro-esophageal reflux disease without esophagitis: Secondary | ICD-10-CM | POA: Insufficient documentation

## 2018-06-09 DIAGNOSIS — R5383 Other fatigue: Secondary | ICD-10-CM | POA: Diagnosis not present

## 2018-06-09 DIAGNOSIS — Z841 Family history of disorders of kidney and ureter: Secondary | ICD-10-CM | POA: Insufficient documentation

## 2018-06-09 DIAGNOSIS — E063 Autoimmune thyroiditis: Secondary | ICD-10-CM | POA: Insufficient documentation

## 2018-06-09 DIAGNOSIS — Z8249 Family history of ischemic heart disease and other diseases of the circulatory system: Secondary | ICD-10-CM | POA: Diagnosis not present

## 2018-06-09 DIAGNOSIS — R400 Somnolence: Secondary | ICD-10-CM

## 2018-06-09 DIAGNOSIS — Z23 Encounter for immunization: Secondary | ICD-10-CM | POA: Diagnosis not present

## 2018-06-09 DIAGNOSIS — F329 Major depressive disorder, single episode, unspecified: Secondary | ICD-10-CM | POA: Diagnosis not present

## 2018-06-09 DIAGNOSIS — Z8261 Family history of arthritis: Secondary | ICD-10-CM | POA: Insufficient documentation

## 2018-06-09 DIAGNOSIS — F419 Anxiety disorder, unspecified: Secondary | ICD-10-CM | POA: Insufficient documentation

## 2018-06-09 DIAGNOSIS — Z7984 Long term (current) use of oral hypoglycemic drugs: Secondary | ICD-10-CM | POA: Insufficient documentation

## 2018-06-09 DIAGNOSIS — G4733 Obstructive sleep apnea (adult) (pediatric): Secondary | ICD-10-CM | POA: Insufficient documentation

## 2018-06-09 DIAGNOSIS — M797 Fibromyalgia: Secondary | ICD-10-CM | POA: Insufficient documentation

## 2018-06-09 DIAGNOSIS — Z79890 Hormone replacement therapy (postmenopausal): Secondary | ICD-10-CM | POA: Diagnosis not present

## 2018-06-09 DIAGNOSIS — E785 Hyperlipidemia, unspecified: Secondary | ICD-10-CM | POA: Diagnosis not present

## 2018-06-09 DIAGNOSIS — Z888 Allergy status to other drugs, medicaments and biological substances status: Secondary | ICD-10-CM | POA: Insufficient documentation

## 2018-06-09 DIAGNOSIS — Z833 Family history of diabetes mellitus: Secondary | ICD-10-CM | POA: Insufficient documentation

## 2018-06-09 DIAGNOSIS — Z79899 Other long term (current) drug therapy: Secondary | ICD-10-CM | POA: Diagnosis not present

## 2018-06-09 DIAGNOSIS — R0609 Other forms of dyspnea: Secondary | ICD-10-CM

## 2018-06-09 DIAGNOSIS — Z8379 Family history of other diseases of the digestive system: Secondary | ICD-10-CM | POA: Insufficient documentation

## 2018-06-09 MED ORDER — DULOXETINE HCL 20 MG PO CPEP: 20.0000 mg | ORAL_CAPSULE | Freq: Every day | ORAL | 6 refills | Status: DC

## 2018-06-09 MED ORDER — NITROGLYCERIN 0.4 MG SL SUBL
0.8000 mg | SUBLINGUAL_TABLET | Freq: Once | SUBLINGUAL | Status: AC
  Administered 2018-06-09: 0.8 mg via SUBLINGUAL
  Filled 2018-06-09: qty 25

## 2018-06-09 MED ORDER — NITROGLYCERIN 0.4 MG SL SUBL
SUBLINGUAL_TABLET | SUBLINGUAL | Status: AC
  Filled 2018-06-09: qty 2

## 2018-06-09 MED ORDER — AMITRIPTYLINE HCL 25 MG PO TABS: 25.0000 mg | ORAL_TABLET | Freq: Every day | ORAL | 1 refills | Status: DC

## 2018-06-09 MED ORDER — METOPROLOL TARTRATE 5 MG/5ML IV SOLN
INTRAVENOUS | Status: AC
  Filled 2018-06-09: qty 20

## 2018-06-09 MED ORDER — IOPAMIDOL (ISOVUE-370) INJECTION 76%
100.0000 mL | Freq: Once | INTRAVENOUS | Status: AC | PRN
  Administered 2018-06-09: 80 mL via INTRAVENOUS

## 2018-06-09 MED ORDER — METOPROLOL TARTRATE 5 MG/5ML IV SOLN
5.0000 mg | INTRAVENOUS | Status: DC | PRN
  Administered 2018-06-09 (×3): 5 mg via INTRAVENOUS
  Filled 2018-06-09: qty 5

## 2018-06-09 MED FILL — LINZESS 290 MCG CAPSULE: 290 | 30 days supply | Qty: 30 | Fill #0

## 2018-06-09 MED FILL — DULoxetine HCL 20 MG CPEP: 20 | 30 days supply | Qty: 30 | Fill #0

## 2018-06-09 NOTE — Progress Notes (Signed)
CT scan completed. Tolerated well. D/C home in wheelchair. Awake and alert. In no distress. 

## 2018-06-09 NOTE — Progress Notes (Signed)
Pt states she feels droswy all day long  Pt states when she sits down she start to fall alseep

## 2018-06-09 NOTE — Patient Instructions (Addendum)
Decrease Amitriptyline to 25 mg daily. Decrease Cymbalta to 20 mg daily.   Influenza Virus Vaccine injection (Fluarix) What is this medicine? INFLUENZA VIRUS VACCINE (in floo EN zuh VAHY ruhs vak SEEN) helps to reduce the risk of getting influenza also known as the flu. This medicine may be used for other purposes; ask your health care provider or pharmacist if you have questions. COMMON BRAND NAME(S): Fluarix, Fluzone What should I tell my health care provider before I take this medicine? They need to know if you have any of these conditions: -bleeding disorder like hemophilia -fever or infection -Guillain-Barre syndrome or other neurological problems -immune system problems -infection with the human immunodeficiency virus (HIV) or AIDS -low blood platelet counts -multiple sclerosis -an unusual or allergic reaction to influenza virus vaccine, eggs, chicken proteins, latex, gentamicin, other medicines, foods, dyes or preservatives -pregnant or trying to get pregnant -breast-feeding How should I use this medicine? This vaccine is for injection into a muscle. It is given by a health care professional. A copy of Vaccine Information Statements will be given before each vaccination. Read this sheet carefully each time. The sheet may change frequently. Talk to your pediatrician regarding the use of this medicine in children. Special care may be needed. Overdosage: If you think you have taken too much of this medicine contact a poison control center or emergency room at once. NOTE: This medicine is only for you. Do not share this medicine with others. What if I miss a dose? This does not apply. What may interact with this medicine? -chemotherapy or radiation therapy -medicines that lower your immune system like etanercept, anakinra, infliximab, and adalimumab -medicines that treat or prevent blood clots like warfarin -phenytoin -steroid medicines like prednisone or  cortisone -theophylline -vaccines This list may not describe all possible interactions. Give your health care provider a list of all the medicines, herbs, non-prescription drugs, or dietary supplements you use. Also tell them if you smoke, drink alcohol, or use illegal drugs. Some items may interact with your medicine. What should I watch for while using this medicine? Report any side effects that do not go away within 3 days to your doctor or health care professional. Call your health care provider if any unusual symptoms occur within 6 weeks of receiving this vaccine. You may still catch the flu, but the illness is not usually as bad. You cannot get the flu from the vaccine. The vaccine will not protect against colds or other illnesses that may cause fever. The vaccine is needed every year. What side effects may I notice from receiving this medicine? Side effects that you should report to your doctor or health care professional as soon as possible: -allergic reactions like skin rash, itching or hives, swelling of the face, lips, or tongue Side effects that usually do not require medical attention (report to your doctor or health care professional if they continue or are bothersome): -fever -headache -muscle aches and pains -pain, tenderness, redness, or swelling at site where injected -weak or tired This list may not describe all possible side effects. Call your doctor for medical advice about side effects. You may report side effects to FDA at 1-800-FDA-1088. Where should I keep my medicine? This vaccine is only given in a clinic, pharmacy, doctor's office, or other health care setting and will not be stored at home. NOTE: This sheet is a summary. It may not cover all possible information. If you have questions about this medicine, talk to your doctor, pharmacist, or  health care provider.  2018 Elsevier/Gold Standard (2008-04-18 09:30:40)

## 2018-06-09 NOTE — Progress Notes (Signed)
Patient ID: Robin [name], female    DOB: 1955/10/06  MRN: [id number]  CC: Medication Management   Subjective: Robin [name] is a 62 y.o. female who presents for UC visit Her concerns today include:  Patient with history ofHTN, pre-DM,OSA, fibromyalgia, depression, obesity, GERD, hypothyroid, OA of multiple js (hands, knees, shoulders)  C/o feeling tired and drowsiness with meds. Feels Cymbalta contributes.  She is also on Elavil, Metoprolol and Hydralazine.  Since last visit with me hydralazine has been increased to 100 mg 3 times a day, Norvasc was restarted at 5 mg daily and Spironolactone 25 mg added.  She reports no increased swelling so far with low-dose amlodipine.  Home BP readings have been 160s over 80s most of last week.  Readings this week were 144/73 and 144/80  Obesity: She has lost 9 pounds since the end of July.  She attributes this to change in her eating habits.  She tries to walk daily.  Patient Active Problem List   Diagnosis Date Noted  . Gastritis and gastroduodenitis 04/26/2018  . Atrophic vaginitis 04/01/2018  . Diabetes mellitus ([place]) 03/31/2018  . Upper airway cough syndrome 02/08/2018  . Dysphagia 12/14/2017  . 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 Urinary incontinence 07/23/2017  . Early satiety 06/16/2017  . Hx of iron deficiency anemia 05/28/2017  . Throat pain in adult 04/05/2017  . Prediabetes 02/04/2017  . Dyspnea on exertion 12/23/2016  . Chronic allergic rhinitis 12/23/2016  . Hemoptysis 12/23/2016  . Fibromyalgia 08/18/2016  . Chronic lymphocytic thyroiditis 08/18/2016  . Depression 04/01/2016  . OSA (obstructive sleep apnea) 04/01/2016  . Essential hypertension 12/09/2015  . Hypothyroidism 12/09/2015  . Morbid obesity due to excess calories ([place]) 12/09/2015  . Constipation 05/27/2015  . Fatty liver 05/27/2015  . Anxiety 06/09/2012  . History of gastroesophageal reflux (GERD) 06/09/2012  . Hyperlipidemia 06/09/2012  . Refusal of blood  transfusions as patient is Jehovah's Witness 06/09/2012  . 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 Abnormal small bowel biopsy 09/18/2011  . Lactose intolerance 09/18/2011  . GERD 01/21/2010     Current Outpatient Medications on File Prior to Visit  Medication Sig Dispense Refill  . acetaminophen (TYLENOL) 500 MG tablet Take 1,000 mg by mouth 2 (two) times daily. Per bottle as needed     . albuterol (PROVENTIL HFA;VENTOLIN HFA) 108 (90 Base) MCG/ACT inhaler Inhale 2 puffs into the lungs every 6 (six) hours as needed for wheezing or shortness of breath. 1 Inhaler 2  . amLODipine (NORVASC) 5 MG tablet Take 1 tablet (5 mg total) by mouth daily. 180 tablet 1  . benzonatate (TESSALON) 200 MG capsule Take 1 capsule (200 mg total) by mouth 3 (three) times daily as needed for cough. 45 capsule 1  . buPROPion (WELLBUTRIN XL) 300 MG 24 hr tablet Take 1 tablet (300 mg total) by mouth every morning. Reported on 12/09/2015 90 tablet 3  . chlorpheniramine (CHLOR-TRIMETON) 4 MG tablet 1 tab by mouth daily at bedtime, may have 1 every 4 hours as needed    . dextromethorphan (DELSYM) 30 MG/5ML liquid 2tsp twice daily as needed for cough    . fluticasone (FLONASE) 50 MCG/ACT nasal spray Place 2 sprays into both nostrils daily. 16 g 6  . furosemide (LASIX) 40 MG tablet 1/2 to 1 tab daily for lower extremity swelling 30 tablet 6  . Glucose Blood (BLOOD GLUCOSE TEST STRIPS) STRP Use as directed. One Touch. 100 each 1  . hydrALAZINE (APRESOLINE) 50 MG tablet Take 2  tablets (100 mg total) by mouth 3 (three) times daily. 180 tablet 5  . HYDROcodone-acetaminophen (NORCO/VICODIN) 5-325 MG tablet Take 1 tablet by mouth every 8 (eight) hours as needed for severe pain.     Marland Kitchen levothyroxine (SYNTHROID, LEVOTHROID) 88 MCG tablet TAKE 1 TABLET BY MOUTH DAILY. 30 tablet 2  . LINZESS 290 MCG CAPS capsule TAKE 1 CAPSULE BY MOUTH DAILY BEFORE BREAKFAST. 30 capsule 5  . meloxicam (MOBIC) 15 MG tablet Take 15 mg by mouth daily.     . metFORMIN (GLUCOPHAGE) 500 MG tablet TAKE 1/2 TABLET DAILY WITH BREAKFAST BY MOUTH. 15 tablet 2  . Metoprolol Tartrate 75 MG TABS Take 75 mg by mouth 2 (two) times daily. 60 tablet 3  . montelukast (SINGULAIR) 10 MG tablet Take 1 tablet (10 mg total) by mouth at bedtime. 30 tablet 11  . Multiple Vitamin (MULTIVITAMIN) tablet Take 1 tablet by mouth daily. Reported on 12/09/2015 90 tablet 1  . RABEprazole (ACIPHEX) 20 MG tablet Take 1 tablet (20 mg total) by mouth 2 (two) times daily with a meal. 180 tablet 3  . sodium chloride (OCEAN) 0.65 % SOLN nasal spray Place 2 sprays into both nostrils every 4 (four) hours as needed for congestion.    Marland Kitchen spironolactone (ALDACTONE) 25 MG tablet Take 1 tablet (25 mg total) by mouth daily. 30 tablet 1  . vitamin B-12 (CYANOCOBALAMIN) 1000 MCG tablet Take 1,000 mcg by mouth daily.     Current Facility-Administered Medications on File Prior to Visit  Medication Dose Route Frequency Provider Last Rate Last Dose  . metoprolol tartrate (LOPRESSOR) 5 MG/5ML injection           . metoprolol tartrate (LOPRESSOR) injection 5 mg  5 mg Intravenous Q5 min PRN Larey Dresser, MD   5 mg at 06/09/18 1025  . nitroGLYCERIN (NITROSTAT) 0.4 MG SL tablet             Allergies  Allergen Reactions  . Celexa [Citalopram Hydrobromide] Other (See Comments)    "Made me feel out of it"  . Gabapentin     Contributed to lower extremity edema  . Norvasc [Amlodipine Besylate]     Edema in lower extremities  . Diflucan [Fluconazole] Rash    Social History   Socioeconomic History  . Marital status: Married    Spouse name: Not on file  . Number of children: 4  . Years of education: 10  . Highest education level: Not on file  Occupational History    Employer: UNEMPLOYED  Social Needs  . Financial resource strain: Not on file  . Food insecurity:    Worry: Not on file    Inability: Not on file  . Transportation needs:    Medical: Not on file    Non-medical: Not on file   Tobacco Use  . Smoking status: Former Smoker    Packs/day: 0.50    Years: 10.00    Pack years: 5.00    Types: Cigarettes    Start date: 07/14/1975    Last attempt to quit: 04/04/1993    Years since quitting: 25.1  . Smokeless tobacco: Never Used  . Tobacco comment: 17 yrs ago  Substance and Sexual Activity  . Alcohol use: No    Alcohol/week: 0.0 standard drinks  . Drug use: No  . Sexual activity: Not on file  Lifestyle  . Physical activity:    Days per week: Not on file    Minutes per session: Not on file  .  Stress: Not on file  Relationships  . Social connections:    Talks on phone: Not on file    Gets together: Not on file    Attends religious service: Not on file    Active member of club or organization: Not on file    Attends meetings of clubs or organizations: Not on file    Relationship status: Not on file  . Intimate partner violence:    Fear of current or ex partner: Not on file    Emotionally abused: Not on file    Physically abused: Not on file    Forced sexual activity: Not on file  Other Topics Concern  . Not on file  Social History Narrative   Lives with husband in an apartment on the first floor.  Has 4 children.     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  Trying to get disability.  Formerly worked as a Teacher, early years/pre.  Education: 11th grade.      [facility] (12/23/16):   Originally from [facility]. She was raised in [place]. 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 No pets currently. No bird exposure. She does have mold in her current home in the bathroom, laundry room, & master bedroom.     Family History  Problem Relation Age of Onset  . Kidney cancer Father   . Hypertension Father   . Hyperlipidemia Father   . Sleep apnea Father   . Diabetes Mother   . Hypertension Mother   . Heart Problems  Mother   . Hyperlipidemia Mother   . Asthma Mother   . Sleep apnea Mother   . Liver disease Brother   . Arthritis Brother   . Hypertension Sister   . Allergic rhinitis Sister   . Stomach cancer Unknown        aunt  . Breast cancer Maternal Grandmother   . Kidney disease Maternal Grandfather   . Breast cancer Paternal Grandmother   . Hypertension Brother   . Hyperlipidemia Brother   . Hypertension Brother   . Hypertension Sister   . Hyperlipidemia Sister   . Lupus Maternal Aunt   . Colon cancer Neg Hx   . Eczema Neg Hx   . Immunodeficiency Neg Hx   . Urticaria Neg Hx     Past Surgical History:  Procedure Laterality Date  . ABDOMINAL HYSTERECTOMY    . ABDOMINAL HYSTERECTOMY  02/18/2000  . CARDIAC CATHETERIZATION  08/18/2003   normal L main/LAD/L Cfx/RCA (Dr. Adora Fridge)  . COLONOSCOPY  2006   Dr. [name], hyperplastic polyps  . COLONOSCOPY  08/06/11   abnormal terminal ileum for 10cm, erosions, geographical ulceration. Bx small bowel mucosa with prominent intramucosal lymphoid aggregates, slightly inflammed  . DIRECT LARYNGOSCOPY  03/15/2012   Procedure: DIRECT LARYNGOSCOPY;  Surgeon: [name], MD;  Location: [facility];  Service: ENT;  Laterality: N/A; Dr. Wolicki--:>no foreign body seen. normal esophagus to 40cm  . ESOPHAGEAL DILATION  04/17/2015   Procedure: ESOPHAGEAL DILATION;  Surgeon: [name], MD;  [facility];  Service: Endoscopy;;  . ESOPHAGOGASTRODUODENOSCOPY  08/23/2008   GYF:VCBS distal esophageal erosions consistent with mild erosive reflux esophagitis, otherwise unremarkable esophagus/ Tiny antral erosions of doubtful clinical significance, otherwise normal stomach, patent pylorus, normal D1 and D2  . ESOPHAGOGASTRODUODENOSCOPY  08/06/11   small hh, noncritical Schatzki's ring s/p 48 F  . ESOPHAGOGASTRODUODENOSCOPY N/A 04/17/2015   OVF:IEPPIR s/p dilation  . ESOPHAGOGASTRODUODENOSCOPY (EGD) WITH PROPOFOL N/A 01/20/2018   Dr. [name]: Erosive  gastropathy, normal-appearing esophagus status post empiric dilation.  Chronic gastritis, no H. pylori.  . ESOPHAGOSCOPY  03/15/2012   Procedure: ESOPHAGOSCOPY;  Surgeon: [name], MD;  Location: [facility];  Service: ENT;  Laterality: N/A;  . KNEE ARTHROSCOPY  04/27/2001  . MALONEY DILATION N/A 01/20/2018   Procedure: Venia Minks DILATION;  Surgeon: [name], MD;  [facility];  Service: Endoscopy;  Laterality: N/A;  . [place]  2003   negative bruce protocol exercise tress test; EF 68%; intermediate risk study due to evidence of anterior wall ischemia extending from mid-ventricle to apex  . PITUITARY SURGERY  06/2012   benign tumor, surgeon at [facility]  . RECTOCELE REPAIR    . TRANSTHORACIC ECHOCARDIOGRAM  2003   EF normal  . VAGINAL PROLAPSE REPAIR    . VIDEO BRONCHOSCOPY Bilateral 03/08/2017   Procedure: VIDEO BRONCHOSCOPY WITHOUT FLUORO;  Surgeon: Javier Glazier, MD;  Location: [facility];  Service: Cardiopulmonary;  Laterality: Bilateral;    ROS: Review of Systems  PHYSICAL EXAM: BP (!) 144/76   Pulse 71   Temp 98.5 F (36.9 C) (Oral)   Resp 16   Wt (!) 321 lb 6.4 oz (145.8 kg)   SpO2 98%   BMI 48.87 kg/m   Wt Readings from Last 3 Encounters:  06/09/18 (!) 321 lb 6.4 oz (145.8 kg)  06/07/18 (!) 321 lb 9.6 oz (145.9 kg)  05/03/18 (!) 330 lb 3.2 oz (149.8 kg)  Sitting BP 140/64, standing 128/80.  Physical Exam  General appearance - alert, well appearing, and in no distress Mental status - normal mood, behavior, speech, dress, motor activity, and thought processes Mouth - mucous membranes moist, pharynx normal without lesions Chest - clear to auscultation, no wheezes, rales or rhonchi, symmetric air entry Heart - normal rate, regular rhythm, normal S1, S2, no murmurs, rubs, clicks or gallops Extremities - no LE edema    Chemistry      Component Value Date/Time   NA 138 06/01/2018 1411   K 4.9 06/01/2018 1411   K 4.2 06/25/2011 1112    CL 100 06/01/2018 1411   CO2 25 06/01/2018 1411   BUN 11 06/01/2018 1411   CREATININE 0.79 06/01/2018 1411   CREATININE 0.80 09/22/2016 1539      Component Value Date/Time   CALCIUM 9.8 06/01/2018 1411   CALCIUM 9.4 06/25/2011 1112   ALKPHOS 81 03/04/2018 1110   ALKPHOS 54 06/25/2011 1112   AST 18 03/04/2018 1110   AST 16 06/25/2011 1112   ALT 20 03/04/2018 1110   BILITOT 0.3 03/04/2018 1110   BILITOT 0.6 06/25/2011 1112     Lab Results  Component Value Date   TSH 2.29 03/08/2018      ASSESSMENT AND PLAN: 1. Essential hypertension Closer to goal.  However given her current symptoms I will hold off on further titration of medications.  She is currently on a complex regimen with 2 diuretics on board.  She will return to the lab tomorrow for BMP to check creatinine and potassium level given recent addition of Spironolactone. -Given difficult to control blood pressure differential diagnoses include RAS.  And we can do MRA or CTA to evaluate but it would not change management. - Basic Metabolic Panel; Future  2. Need for immunization  against influenza - Flu Vaccine QUAD 36+ mos IM  3. Fatigue, unspecified type 4. Drowsiness -Likely due to combination of all of her blood pressure medications and Cymbalta/amitriptyline. -Decrease Cymbalta to 20 mg daily and amitriptyline to 25 mg daily   Patient was given the opportunity to ask questions.  Patient verbalized understanding of the plan and was able to repeat key elements of the plan.   Orders Placed This Encounter  Procedures  . Flu Vaccine QUAD 36+ mos IM  . Basic Metabolic Panel     Requested Prescriptions   Signed Prescriptions Disp Refills  . DULoxetine (CYMBALTA) 20 MG capsule 30 capsule 6    Sig: Take 1 capsule (20 mg total) by mouth daily.  Marland Kitchen amitriptyline (ELAVIL) 25 MG tablet 30 tablet 1    Sig: Take 1 tablet (25 mg total) by mouth at bedtime.    Return in about 3 weeks (around 06/30/2018).  Karle Plumber, MD, FACP

## 2018-06-10 ENCOUNTER — Ambulatory Visit: Payer: Medicaid Other | Attending: Family Medicine

## 2018-06-10 DIAGNOSIS — I10 Essential (primary) hypertension: Secondary | ICD-10-CM | POA: Diagnosis not present

## 2018-06-10 NOTE — Progress Notes (Signed)
Patient here for lab visit only 

## 2018-06-11 LAB — BASIC METABOLIC PANEL
BUN / CREAT RATIO: 10 (ref 9–23)
BUN: 9 mg/dL (ref 6–24)
CO2: 23 mmol/L (ref 20–29)
CREATININE: 0.89 mg/dL (ref 0.57–1.00)
Calcium: 9.7 mg/dL (ref 8.7–10.2)
Chloride: 99 mmol/L (ref 96–106)
GFR, EST AFRICAN AMERICAN: 82 mL/min/{1.73_m2} (ref >59)
GFR, EST NON AFRICAN AMERICAN: 71 mL/min/{1.73_m2} (ref >59)
Glucose: 113 mg/dL — ABNORMAL HIGH (ref 65–99)
Potassium: 4.5 mmol/L (ref 3.5–5.2)
Sodium: 139 mmol/L (ref 134–144)

## 2018-06-15 ENCOUNTER — Telehealth: Payer: Self-pay

## 2018-06-15 NOTE — Telephone Encounter (Signed)
Contacted pt to go over lab results pt is aware and doesn't have any questions or concerns 

## 2018-06-23 DIAGNOSIS — F54 Psychological and behavioral factors associated with disorders or diseases classified elsewhere: Secondary | ICD-10-CM | POA: Diagnosis not present

## 2018-06-23 MED FILL — LEVOTHYROXINE 88 MCG TABLET: 88 | 30 days supply | Qty: 30 | Fill #1

## 2018-06-23 MED FILL — FUROSEMIDE 40 MG TAB: 40 | 30 days supply | Qty: 30 | Fill #4

## 2018-06-23 MED FILL — SPIRONOLACTONE 25 MG TABLET: 25 | 30 days supply | Qty: 30 | Fill #1

## 2018-06-27 MED FILL — metFORMIN HCL 500 MG TABS: 500 | 30 days supply | Qty: 15 | Fill #1

## 2018-06-27 MED FILL — METOPROLOL TARTRATE 75 MG T: 75 | 30 days supply | Qty: 60 | Fill #1

## 2018-06-28 MED FILL — BUPROPION HCL XL 300 MG TAB: 300 | 30 days supply | Qty: 30 | Fill #8

## 2018-06-28 MED FILL — RABEPRAZOLE SOD DR 20 MG TA: 20 | 30 days supply | Qty: 60 | Fill #2

## 2018-06-29 DIAGNOSIS — M150 Primary generalized (osteo)arthritis: Secondary | ICD-10-CM | POA: Diagnosis not present

## 2018-06-29 DIAGNOSIS — M539 Dorsopathy, unspecified: Secondary | ICD-10-CM | POA: Diagnosis not present

## 2018-06-29 DIAGNOSIS — M791 Myalgia, unspecified site: Secondary | ICD-10-CM | POA: Diagnosis not present

## 2018-06-29 MED FILL — CELECOXIB 100 MG CAP: 100 | 30 days supply | Qty: 60 | Fill #0

## 2018-07-01 ENCOUNTER — Encounter: Payer: Self-pay | Admitting: Podiatry

## 2018-07-01 ENCOUNTER — Ambulatory Visit: Payer: Medicaid Other | Admitting: Podiatry

## 2018-07-01 DIAGNOSIS — E1142 Type 2 diabetes mellitus with diabetic polyneuropathy: Secondary | ICD-10-CM

## 2018-07-01 DIAGNOSIS — M79674 Pain in right toe(s): Secondary | ICD-10-CM | POA: Diagnosis not present

## 2018-07-01 DIAGNOSIS — B351 Tinea unguium: Secondary | ICD-10-CM | POA: Diagnosis not present

## 2018-07-01 DIAGNOSIS — L84 Corns and callosities: Secondary | ICD-10-CM

## 2018-07-01 DIAGNOSIS — M79675 Pain in left toe(s): Secondary | ICD-10-CM

## 2018-07-05 NOTE — Progress Notes (Signed)
Subjective: Robin [name] presents for followup diabetic foot care. She has h/o  diabetes, diabetic neuropathy and painful mycotic toenails.  Her pain is aggravated when wearing enclosed shoe gear and is relieved with periodic professional debridement.  She voices no new pedal problems on today's visit.  Objective: Vascular Examination: Capillary refill time immediate x 10 digits Dorsalis pedis and Posterior tibial pulses present b/l No digital hair x 10 digits Skin temperature warm b/l and symmetrically Trace pedal edema b/l LE  Dermatological Examination: Skin with normal turgor, texture and tone b/l Toenails 1-5 b/l discolored, thick, dystrophic with subungual debris and pain with palpation to nailbeds due to thickness of nails. Hyperkeratotic lesion submetatarsal head 5 b/l. No flocculence, no pain, no erythema, no edema, no underlying wound.  Musculoskeletal: Muscle strength 5/5 to all LE muscle groups  Neurological: Sensation diminished with 10 gram monofilament. Vibratory sensation diminished  Assessment: 1. Painful onychomycosis toenails 1-5 b/l 2. Callus submetatarsal head 5 b/l 3. NIDDM with Diabetic neuropathy  Plan: 1. Continue diabetic foot care principles.  2. Toenails 1-5 b/l were debrided in length and girth without iatrogenic bleeding. 3. Hyperkeratotic lesions submetatarsal head 5 b/l pared with sterile chisel blade 4. Patient to continue soft, supportive shoe gear 5. Patient to report any pedal injuries to medical professional  6. Follow up 3 months. Patient/POA to call should there be a concern in the interim.

## 2018-07-07 ENCOUNTER — Other Ambulatory Visit: Payer: Self-pay | Admitting: Internal Medicine

## 2018-07-07 DIAGNOSIS — I10 Essential (primary) hypertension: Secondary | ICD-10-CM

## 2018-07-07 MED FILL — AMITRIPTYLINE HCL 50 MG TAB: 50 | 30 days supply | Qty: 30 | Fill #0

## 2018-07-08 ENCOUNTER — Ambulatory Visit: Payer: Medicaid Other | Admitting: Internal Medicine

## 2018-07-08 MED FILL — hydrALAZINE HCL 50 MG TABS: 50 | 30 days supply | Qty: 180 | Fill #1

## 2018-07-11 ENCOUNTER — Encounter: Payer: Self-pay | Admitting: Internal Medicine

## 2018-07-11 ENCOUNTER — Ambulatory Visit: Payer: Medicaid Other | Admitting: Internal Medicine

## 2018-07-11 ENCOUNTER — Ambulatory Visit: Payer: Medicaid Other | Attending: Internal Medicine | Admitting: Internal Medicine

## 2018-07-11 VITALS — BP 166/76 | HR 61 | Temp 98.2°F | Resp 16 | Ht 68.0 in | Wt 336.6 lb

## 2018-07-11 DIAGNOSIS — Z7984 Long term (current) use of oral hypoglycemic drugs: Secondary | ICD-10-CM | POA: Insufficient documentation

## 2018-07-11 DIAGNOSIS — Z87891 Personal history of nicotine dependence: Secondary | ICD-10-CM | POA: Insufficient documentation

## 2018-07-11 DIAGNOSIS — D352 Benign neoplasm of pituitary gland: Secondary | ICD-10-CM | POA: Insufficient documentation

## 2018-07-11 DIAGNOSIS — D509 Iron deficiency anemia, unspecified: Secondary | ICD-10-CM | POA: Insufficient documentation

## 2018-07-11 DIAGNOSIS — J029 Acute pharyngitis, unspecified: Secondary | ICD-10-CM | POA: Diagnosis not present

## 2018-07-11 DIAGNOSIS — F329 Major depressive disorder, single episode, unspecified: Secondary | ICD-10-CM | POA: Diagnosis not present

## 2018-07-11 DIAGNOSIS — F419 Anxiety disorder, unspecified: Secondary | ICD-10-CM | POA: Insufficient documentation

## 2018-07-11 DIAGNOSIS — K219 Gastro-esophageal reflux disease without esophagitis: Secondary | ICD-10-CM | POA: Insufficient documentation

## 2018-07-11 DIAGNOSIS — J069 Acute upper respiratory infection, unspecified: Secondary | ICD-10-CM | POA: Diagnosis not present

## 2018-07-11 DIAGNOSIS — E785 Hyperlipidemia, unspecified: Secondary | ICD-10-CM | POA: Diagnosis not present

## 2018-07-11 DIAGNOSIS — I10 Essential (primary) hypertension: Secondary | ICD-10-CM | POA: Insufficient documentation

## 2018-07-11 DIAGNOSIS — K5900 Constipation, unspecified: Secondary | ICD-10-CM | POA: Diagnosis not present

## 2018-07-11 DIAGNOSIS — Z888 Allergy status to other drugs, medicaments and biological substances status: Secondary | ICD-10-CM | POA: Diagnosis not present

## 2018-07-11 DIAGNOSIS — M797 Fibromyalgia: Secondary | ICD-10-CM | POA: Insufficient documentation

## 2018-07-11 DIAGNOSIS — E063 Autoimmune thyroiditis: Secondary | ICD-10-CM | POA: Insufficient documentation

## 2018-07-11 DIAGNOSIS — Z883 Allergy status to other anti-infective agents status: Secondary | ICD-10-CM | POA: Diagnosis not present

## 2018-07-11 DIAGNOSIS — Z79899 Other long term (current) drug therapy: Secondary | ICD-10-CM | POA: Diagnosis not present

## 2018-07-11 DIAGNOSIS — K760 Fatty (change of) liver, not elsewhere classified: Secondary | ICD-10-CM | POA: Diagnosis not present

## 2018-07-11 DIAGNOSIS — G4733 Obstructive sleep apnea (adult) (pediatric): Secondary | ICD-10-CM | POA: Diagnosis not present

## 2018-07-11 DIAGNOSIS — Z6841 Body Mass Index (BMI) 40.0 and over, adult: Secondary | ICD-10-CM | POA: Insufficient documentation

## 2018-07-11 DIAGNOSIS — R7303 Prediabetes: Secondary | ICD-10-CM | POA: Diagnosis not present

## 2018-07-11 LAB — POCT GLYCOSYLATED HEMOGLOBIN (HGB A1C): Hemoglobin A1C: 5.4 % (ref 4.0–5.6)

## 2018-07-11 LAB — GLUCOSE, POCT (MANUAL RESULT ENTRY): POC Glucose: 106 mg/dl — AB (ref 70–99)

## 2018-07-11 MED ORDER — BENZONATATE 200 MG PO CAPS: 200.0000 mg | ORAL_CAPSULE | Freq: Three times a day (TID) | ORAL | 0 refills | Status: DC | PRN

## 2018-07-11 MED ORDER — BENZONATATE 100 MG PO CAPS: 100.0000 mg | ORAL_CAPSULE | Freq: Three times a day (TID) | ORAL | 1 refills | Status: DC | PRN

## 2018-07-11 NOTE — Progress Notes (Signed)
Patient ID: Robin [name], female    DOB: January 10, 1956  MRN: [id number]  CC: Follow-up   Subjective: Robin [name] is a 62 y.o. female who presents for f/u HTN and UC visit Her concerns today include:   Patient with history ofHTN, pre-DM,OSA, fibromyalgia, depression, obesity, GERD, hypothyroid, OA of multiple js (hands, knees, shoulders)  C/O sore throat, cough productive of yellow phlegm, pain in both ears RT>LT, pain/pressure below eyes and nasal congestion x 3 days.  Endorses  CP with coughing.  Reports subjective fever  2 days ago.  -using Albuterol inhaler and OTC cough syrup.  HTN: has log with her. Checked once a day for past 3 wks.  Range 120-150/60-82.  She did not take her medicines as yet for today. She limits salt in the foods.  Patient Active Problem List   Diagnosis Date Noted  . Gastritis and gastroduodenitis 04/26/2018  . Atrophic vaginitis 04/01/2018  . Diabetes mellitus ([place]) 03/31/2018  . Upper airway cough syndrome 02/08/2018  . Dysphagia 12/14/2017  . 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 Urinary incontinence 07/23/2017  . Early satiety 06/16/2017  . Hx of iron deficiency anemia 05/28/2017  . Throat pain in adult 04/05/2017  . Prediabetes 02/04/2017  . Dyspnea on exertion 12/23/2016  . Chronic allergic rhinitis 12/23/2016  . Hemoptysis 12/23/2016  . Fibromyalgia 08/18/2016  . Chronic lymphocytic thyroiditis 08/18/2016  . Depression 04/01/2016  . OSA (obstructive sleep apnea) 04/01/2016  . Essential hypertension 12/09/2015  . Hypothyroidism 12/09/2015  . Morbid obesity due to excess calories ([place]) 12/09/2015  . Constipation 05/27/2015  . Fatty liver 05/27/2015  . Anxiety 06/09/2012  . History of gastroesophageal reflux (GERD) 06/09/2012  . Hyperlipidemia 06/09/2012  . Refusal of blood transfusions as patient is Jehovah's Witness 06/09/2012  . 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 Abnormal small bowel biopsy 09/18/2011  . Lactose intolerance  09/18/2011  . GERD 01/21/2010     Current Outpatient Medications on File Prior to Visit  Medication Sig Dispense Refill  . acetaminophen (TYLENOL) 500 MG tablet Take 1,000 mg by mouth 2 (two) times daily. Per bottle as needed     . albuterol (PROVENTIL HFA;VENTOLIN HFA) 108 (90 Base) MCG/ACT inhaler Inhale 2 puffs into the lungs every 6 (six) hours as needed for wheezing or shortness of breath. 1 Inhaler 2  . amitriptyline (ELAVIL) 50 MG tablet TAKE 1 TABLET BY MOUTH AT BEDTIME. 30 tablet 0  . amLODipine (NORVASC) 5 MG tablet Take 1 tablet (5 mg total) by mouth daily. 180 tablet 1  . buPROPion (WELLBUTRIN XL) 300 MG 24 hr tablet Take 1 tablet (300 mg total) by mouth every morning. Reported on 12/09/2015 90 tablet 3  . chlorpheniramine (CHLOR-TRIMETON) 4 MG tablet 1 tab by mouth daily at bedtime, may have 1 every 4 hours as needed    . DULoxetine (CYMBALTA) 20 MG capsule Take 1 capsule (20 mg total) by mouth daily. 30 capsule 6  . fluticasone (FLONASE) 50 MCG/ACT nasal spray Place 2 sprays into both nostrils daily. 16 g 6  . furosemide (LASIX) 40 MG tablet 1/2 to 1 tab daily for lower extremity swelling 30 tablet 6  . Glucose Blood (BLOOD GLUCOSE TEST STRIPS) STRP Use as directed. One Touch. 100 each 1  . hydrALAZINE (APRESOLINE) 50 MG tablet Take 2 tablets (100 mg total) by mouth 3 (three) times daily. 180 tablet 5  . HYDROcodone-acetaminophen (NORCO/VICODIN) 5-325 MG tablet Take 1 tablet by mouth every 8 (eight) hours as needed for severe  pain.     . levothyroxine (SYNTHROID, LEVOTHROID) 88 MCG tablet TAKE 1 TABLET BY MOUTH DAILY. 30 tablet 2  . LINZESS 290 MCG CAPS capsule TAKE 1 CAPSULE BY MOUTH DAILY BEFORE BREAKFAST. 30 capsule 5  . metFORMIN (GLUCOPHAGE) 500 MG tablet TAKE 1/2 TABLET DAILY WITH BREAKFAST BY MOUTH. 15 tablet 2  . Metoprolol Tartrate 75 MG TABS Take 75 mg by mouth 2 (two) times daily. 60 tablet 3  . montelukast (SINGULAIR) 10 MG tablet Take 1 tablet (10 mg total) by mouth at  bedtime. 30 tablet 11  . Multiple Vitamin (MULTIVITAMIN) tablet Take 1 tablet by mouth daily. Reported on 12/09/2015 90 tablet 1  . RABEprazole (ACIPHEX) 20 MG tablet Take 1 tablet (20 mg total) by mouth 2 (two) times daily with a meal. 180 tablet 3  . sodium chloride (OCEAN) 0.65 % SOLN nasal spray Place 2 sprays into both nostrils every 4 (four) hours as needed for congestion.    Marland Kitchen spironolactone (ALDACTONE) 25 MG tablet Take 1 tablet (25 mg total) by mouth daily. 30 tablet 1  . vitamin B-12 (CYANOCOBALAMIN) 1000 MCG tablet Take 1,000 mcg by mouth daily.    Marland Kitchen dextromethorphan (DELSYM) 30 MG/5ML liquid 2tsp twice daily as needed for cough    . meloxicam (MOBIC) 15 MG tablet Take 15 mg by mouth daily.     No current facility-administered medications on file prior to visit.     Allergies  Allergen Reactions  . Celexa [Citalopram Hydrobromide] Other (See Comments)    "Made me feel out of it"  . Gabapentin     Contributed to lower extremity edema  . Norvasc [Amlodipine Besylate]     Edema in lower extremities  . Diflucan [Fluconazole] Rash    Social History   Socioeconomic History  . Marital status: Married    Spouse name: Not on file  . Number of children: 4  . Years of education: 10  . Highest education level: Not on file  Occupational History    Employer: UNEMPLOYED  Social Needs  . Financial resource strain: Not on file  . Food insecurity:    Worry: Not on file    Inability: Not on file  . Transportation needs:    Medical: Not on file    Non-medical: Not on file  Tobacco Use  . Smoking status: Former Smoker    Packs/day: 0.50    Years: 10.00    Pack years: 5.00    Types: Cigarettes    Start date: 07/14/1975    Last attempt to quit: 04/04/1993    Years since quitting: 25.2  . Smokeless tobacco: Never Used  . Tobacco comment: 17 yrs ago  Substance and Sexual Activity  . Alcohol use: No    Alcohol/week: 0.0 standard drinks  . Drug use: No  . Sexual activity: Not on  file  Lifestyle  . Physical activity:    Days per week: Not on file    Minutes per session: Not on file  . Stress: Not on file  Relationships  . Social connections:    Talks on phone: Not on file    Gets together: Not on file    Attends religious service: Not on file    Active member of club or organization: Not on file    Attends meetings of clubs or organizations: Not on file    Relationship status: Not on file  . Intimate partner violence:    Fear of current or ex partner: Not on file  Emotionally abused: Not on file    Physically abused: Not on file    Forced sexual activity: Not on file  Other Topics Concern  . Not on file  Social History Narrative   Lives with husband in an apartment on the first floor.  Has 4 children.     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  Trying to get disability.  Formerly worked as a Teacher, early years/pre.  Education: 11th grade.      [facility] (12/23/16):   Originally from [facility]. She was raised in [place]. 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 No pets currently. No bird exposure. She does have mold in her current home in the bathroom, laundry room, & master bedroom.     Family History  Problem Relation Age of Onset  . Kidney cancer Father   . Hypertension Father   . Hyperlipidemia Father   . Sleep apnea Father   . Diabetes Mother   . Hypertension Mother   . Heart Problems Mother   . Hyperlipidemia Mother   . Asthma Mother   . Sleep apnea Mother   . Liver disease Brother   . Arthritis Brother   . Hypertension Sister   . Allergic rhinitis Sister   . Stomach cancer Unknown        aunt  . Breast cancer Maternal Grandmother   . Kidney disease Maternal Grandfather   . Breast cancer Paternal Grandmother   . Hypertension Brother   . Hyperlipidemia Brother   . Hypertension Brother    . Hypertension Sister   . Hyperlipidemia Sister   . Lupus Maternal Aunt   . Colon cancer Neg Hx   . Eczema Neg Hx   . Immunodeficiency Neg Hx   . Urticaria Neg Hx     Past Surgical History:  Procedure Laterality Date  . ABDOMINAL HYSTERECTOMY    . ABDOMINAL HYSTERECTOMY  02/18/2000  . CARDIAC CATHETERIZATION  08/18/2003   normal L main/LAD/L Cfx/RCA (Dr. Adora Fridge)  . COLONOSCOPY  2006   Dr. [name], hyperplastic polyps  . COLONOSCOPY  08/06/11   abnormal terminal ileum for 10cm, erosions, geographical ulceration. Bx small bowel mucosa with prominent intramucosal lymphoid aggregates, slightly inflammed  . DIRECT LARYNGOSCOPY  03/15/2012   Procedure: DIRECT LARYNGOSCOPY;  Surgeon: [name], MD;  Location: [facility];  Service: ENT;  Laterality: N/A; Dr. Wolicki--:>no foreign body seen. normal esophagus to 40cm  . ESOPHAGEAL DILATION  04/17/2015   Procedure: ESOPHAGEAL DILATION;  Surgeon: [name], MD;  [facility];  Service: Endoscopy;;  . ESOPHAGOGASTRODUODENOSCOPY  08/23/2008   ZOX:WRUE distal esophageal erosions consistent with mild erosive reflux esophagitis, otherwise unremarkable esophagus/ Tiny antral erosions of doubtful clinical significance, otherwise normal stomach, patent pylorus, normal D1 and D2  . ESOPHAGOGASTRODUODENOSCOPY  08/06/11   small hh, noncritical Schatzki's ring s/p 41 F  . ESOPHAGOGASTRODUODENOSCOPY N/A 04/17/2015   AVW:[id number] s/p dilation  . ESOPHAGOGASTRODUODENOSCOPY (EGD) WITH PROPOFOL N/A 01/20/2018   Dr. [name]: Erosive gastropathy, normal-appearing esophagus status post empiric dilation.  Chronic gastritis, no H. pylori.  . ESOPHAGOSCOPY  03/15/2012   Procedure: ESOPHAGOSCOPY;  Surgeon: [name], MD;  Location: [facility];  Service: ENT;  Laterality: N/A;  . KNEE ARTHROSCOPY  04/27/2001  . MALONEY DILATION N/A 01/20/2018   Procedure: MALONEY DILATION;  Surgeon: [name], MD;  [facility];  Service: Endoscopy;   Laterality: N/A;  . [place]  2003   negative bruce protocol exercise tress test; EF 68%; intermediate risk study due to evidence of anterior wall ischemia extending from mid-ventricle to apex  . PITUITARY SURGERY  06/2012   benign tumor, surgeon at [facility]  . RECTOCELE REPAIR    . TRANSTHORACIC ECHOCARDIOGRAM  2003   EF normal  . VAGINAL PROLAPSE REPAIR    . VIDEO BRONCHOSCOPY Bilateral 03/08/2017   Procedure: VIDEO BRONCHOSCOPY WITHOUT FLUORO;  Surgeon: Javier Glazier, MD;  Location: [facility];  Service: Cardiopulmonary;  Laterality: Bilateral;    ROS: Review of Systems Neg except as above  PHYSICAL EXAM: BP (!) 173/82 (BP Location: Left Arm, Patient Position: Sitting, Cuff Size: Large)   Pulse 61   Temp 98.2 F (36.8 C) (Oral)   Resp 16   Ht 5\' 8"  (1.727 m)   Wt (!) 336 lb 9.6 oz (152.7 kg)   SpO2 95%   BMI 51.18 kg/m   Physical Exam Repeat blood pressure 166/76  General appearance - alert, well appearing, and in no distress Mental status - normal mood, behavior, speech, dress, motor activity, and thought processes Eyes - pupils equal and reactive, extraocular eye movements intact Ears - bilateral TM's and external ear canals normal Nose -nasal mucosa dry.  Mild enlargement of nasal turbinates. Mouth -no oral lesions.  Throat is without erythema or exudates. Neck - supple, no cervical lymphadenopathy.  No preauricular lymphadenopathy.  Chest - clear to auscultation, no wheezes, rales or rhonchi, symmetric air entry Heart - normal rate, regular rhythm, normal S1, S2, no murmurs, rubs, clicks or gallops  Results for orders placed or performed in visit on 07/11/18  POCT glycosylated hemoglobin (Hb A1C)  Result Value Ref Range   Hemoglobin A1C 5.4 4.0 - 5.6 %   HbA1c POC (<> result, manual entry)     HbA1c, POC (prediabetic range)     HbA1c, POC (controlled diabetic range)    POCT glucose (manual entry)  Result Value Ref Range   POC Glucose 106  (A) 70 - 99 mg/dl    ASSESSMENT AND PLAN: 1. Viral upper respiratory tract infection Conservative measures recommended. Advised use of throat lozenges or gargling with a little salt and water for the sore throat. Use Vicks VapoRub on the chest and under the nose as needed for congestion. - benzonatate (TESSALON) 100 MG capsule; Take 1 capsule (100 mg total) by mouth 3 (three) times daily as needed for cough.  Dispense: 30 capsule; Refill: 1  2. Essential hypertension Most of the home blood pressure readings were good.  There was several outliers.  We will have her continue current dosages of medications.  I also advised that she speak with her psychiatrist about possibly lowering the dose of the Wellbutrin as it can cause increase in blood pressure.  3. Prediabetes - POCT glycosylated hemoglobin (Hb A1C) - POCT glucose (manual entry)  Patient was given the opportunity to ask questions.  Patient verbalized understanding of the plan and was able to repeat key elements of the plan.   Orders Placed This Encounter  Procedures  . POCT glycosylated hemoglobin (Hb A1C)  . POCT glucose (manual entry)     Requested Prescriptions   Signed Prescriptions Disp Refills  . benzonatate (TESSALON) 200 MG capsule 30 capsule 0    Sig: Take 1 capsule (200 mg total) by mouth 3 (three)  times daily as needed for cough.    Return in about 2 months (around 09/10/2018).  Karle Plumber, MD, FACP

## 2018-07-11 NOTE — Patient Instructions (Signed)
Use the [employer] as needed for cough.  Obtain and use Vicks VapoRub on the chest and under the nostrils as needed for congestion.  You can use throat lozenges or gargle with warm water mixed with a little salt for the sore throat.  Ask your psychiatrist about whether the dose of Wellbutrin can be lowered as it may be contributing to her blood pressure being difficult to control.

## 2018-07-11 NOTE — Progress Notes (Signed)
Pt. Is here to follow-up on hypertension.  Pt. Stated her head been hurting, and she have a bad cough with chest pain when she cough.

## 2018-07-14 ENCOUNTER — Other Ambulatory Visit: Payer: Self-pay

## 2018-07-14 DIAGNOSIS — J309 Allergic rhinitis, unspecified: Secondary | ICD-10-CM

## 2018-07-15 DIAGNOSIS — M25562 Pain in left knee: Secondary | ICD-10-CM | POA: Diagnosis not present

## 2018-07-15 DIAGNOSIS — M79672 Pain in left foot: Secondary | ICD-10-CM | POA: Diagnosis not present

## 2018-07-15 DIAGNOSIS — G894 Chronic pain syndrome: Secondary | ICD-10-CM | POA: Diagnosis not present

## 2018-07-15 DIAGNOSIS — M25561 Pain in right knee: Secondary | ICD-10-CM | POA: Diagnosis not present

## 2018-07-15 MED ORDER — MONTELUKAST SODIUM 10 MG PO TABS: 10.0000 mg | ORAL_TABLET | Freq: Every day | ORAL | 4 refills | Status: DC

## 2018-07-15 MED FILL — MONTELUKAST SOD 10 MG TAB: 10 | 30 days supply | Qty: 30 | Fill #0

## 2018-07-19 ENCOUNTER — Encounter: Payer: Self-pay | Admitting: Internal Medicine

## 2018-07-19 ENCOUNTER — Ambulatory Visit (INDEPENDENT_AMBULATORY_CARE_PROVIDER_SITE_OTHER): Payer: Medicaid Other | Admitting: Internal Medicine

## 2018-07-19 VITALS — BP 122/68 | HR 64 | Ht 68.0 in | Wt 328.0 lb

## 2018-07-19 DIAGNOSIS — R0609 Other forms of dyspnea: Secondary | ICD-10-CM

## 2018-07-19 DIAGNOSIS — R058 Other specified cough: Secondary | ICD-10-CM

## 2018-07-19 DIAGNOSIS — R05 Cough: Secondary | ICD-10-CM | POA: Diagnosis not present

## 2018-07-19 MED ORDER — BENZONATATE 200 MG PO CAPS: 200.0000 mg | ORAL_CAPSULE | Freq: Three times a day (TID) | ORAL | 2 refills | Status: DC | PRN

## 2018-07-19 MED ORDER — GABAPENTIN 300 MG PO CAPS: 300.0000 mg | ORAL_CAPSULE | Freq: Four times a day (QID) | ORAL | 2 refills | Status: DC

## 2018-07-19 MED FILL — GABAPENTIN 300 MG CAPSULE: 300 | 30 days supply | Qty: 120 | Fill #0

## 2018-07-19 NOTE — Progress Notes (Signed)
Subjective:     Patient ID: Robin [name], female   DOB: Jun 16, 1956       MRN: [id number]     Brief patient profile:  70  yobf quit smoking in 1994 seen previously for dyspnea, chronic allergic rhinitis and hemoptysis (felt secondary to erosions from tonsils status post bronchoscopy) - has sensation of globus/ dysphagia dating back at least to 2009   Has seen Dr [name] for pos allergies but does not recall details (even symptoms)      TESTS  PFT 02/09/17: FVC  2.35 L (77%) FEV1 2.06 L (85%) FEV1/FVC 0.88 FEF 25-75 3.32 L (142%) negative bronchodilator response TLC 3.74 L (68%) RV 71% ERV 28% DLCO corrected 71% 08/22/15: FVC 2.32 L (119%) FEV1 1.99 L (121%) FEV1/FVC 0.85 FEF 25-75 2.59 L (118%)  6MWT 02/16/17:  Walked 126 meters / Baseline Sat 99%  On RA / Nadir Sat 99% on RA  METHACHOLINE CHALLENGE TEST (02/24/17):  Normal bronchial hyperreactivity.  IMAGING BARIUM SWALLOW/ESOPHAGRAM 04/30/17 (per radiologist):  Hiatal hernia with mild reflux. Esophageal dysmotility  CT NECK/SOFT TISSUE W/ CONTRAST 04/19/17 (per radiologist): No inflammatory change or foreign body can be seen in the pharynx.  Cervical spondylosis. 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  CT CHEST W/O 01/07/17   No parenchymal nodule, mass, or opacity appreciated. No pleural effusion or thickening. No pericardial effusion. No pathologic mediastinal adenopathy.  CTA CHEST 12/13/15   No pulmonary emboli. No pleural effusion or thickening. No pericardial effusion. No pathologic mediastinal adenopathy. No parenchymal nodule, mass, or opacification.  CARDIAC TTE (09/29/16): LV normal in size with moderate concentric hypertrophy. EF 55-60% with no regional wall motion abnormalities. Indeterminant diastolic function. LA & RA normal in size. RV normal in size and function. No aortic stenosis or regurgitation. Aortic root normal in size. No mitral stenosis or regurgitation. No  significant pulmonic regurgitation. No significant tricuspid regurgitation. No pericardial effusion.  LEFT HEART CATHETERIZATION (11/13/3):  Aortic systolic pressure 325  Diastolic pressure 90  Left ventricular systolic pressure 498  End-diastolic pressure 27 left main coronary artery normal  Left anterior descending artery normal  Left circumflex coronary artery normal  Right coronary artery dominant and normal   Overall estimated ejection fraction greater than 60% without focal wall motion abnormality  MICROBIOLOGY Endobronchial/Endotracheal Brushing 03/08/17: Rare Haemophilus influenza beta lactamase positive  PATHOLOGY Endobronchial/Endotracheal Brushing 03/08/17: No malignancy. Negative for herpes & HPV.  LABS 05/28/17 BNP:  6.1 ESR:  34 TSH:  1.060 Anti-CCP:  6 RF:  <10  02/16/17 IgE: 81 RAST panel: Cockroach 0.2 / D. farinae 0.2 / D. pteronyssinus 0.24   12/23/16 INR: 1.0 PTT: 24.1 Chromatin Ab:  <0.2 Smith Ab: <0.2 DS DNA Ab:  <1 SSA:  <0.2 SSB:  <0.2 Anti-CCP:  <16 SCL-70:  <0.2 ENA RNP Ab: Centromere Ab Screen:  <0.2 Jo-1 Ab:  <0.2  05/12/16 ANA: Negative Rheumatoid factor:  <10     02/08/2018 acute extended ov/Emmalene Kattner re:  acute on chronic cough x decades / establish with me [name] pt) Chief Complaint  Patient presents with  . Acute Visit    Pt c/o cough with yellow, wheezing and SOB x 3 days. She states her chest feels sore when she coughs. She has been using her albuterol inahler 3 x daily on average.   baseline = maint protonix right before first meal then before supper, and singuliar and needing saba  3 x  Times a day but not noct then severe  cough /subj  wheeze/ chest soreness 3 days prior to OV and sob with any activity  rec Prednisone 10 mg take  4 each am x 2 days,   2 each am x 2 days,  1 each am x 2 days and stop  zpak Protonix 40  Mg Take 30- 60 min before your first and last meals of the day  For drainage / throat tickle try take  CHLORPHENIRAMINE  4 mg - take one every 4 hours as needed   Take delsym two tsp every 12 hours and supplement if needed with  tramadol 50 mg up to 2 every 4 hours GERD  Diet   Please schedule a follow up office visit in 4 weeks, sooner if needed  with all medications /inhalers/ solutions in hand so we can verify exactly what you are taking. This includes all medications from all doctors and over the counters      03/08/2018  f/u ov/Jaidyn Usery re:  Cough x decades / did not bring all meds but most of them  Chief Complaint  Patient presents with  . Follow-up    Breathing is unchanged. She is still coughing up yellow sputum. She is using her albuterol inhaler 3 x per wk on average.   Dyspnea:  Even if not coughing doe x room to room  X  Years = MMRC3 = can't walk 100 yards even at a slow pace at a flat grade s stopping due to sob  = stops a lot to shop  Cough: fits of cough are  Sporadic daytime mostly  Sleep: she says fine, husband hears wheezing  SABA use:  Doesn't really help/ note MCT neg  rec Stop corevidol and losartan and instead try bisoprolol 5 mg twice daily  For cough try tessalon 200 mg every 8 hours as needed  For drainage / throat tickle try take CHLORPHENIRAMINE  4 mg - take one every 4 hours as needed   Please see patient coordinator before you leave today  to schedule sinus CT> never done denied by insurance          07/19/2018  Acute extended  ov/Deaaron Fulghum re: cough / dysphagia/ globus > 10 y worse since off gabapentin 300 mg x 4 weeks with worse doe also / did not bring updated med calendar as requested  Chief Complaint  Patient presents with  . Acute Visit    Increased SOB and cough x 3-4 wks. Her cough is occ prod with green to yellow sputum.  She also c/o hoarseness.   Dyspnea:   MMRC3 = can't walk 100 yards even at a slow pace at a flat grade s stopping due to sob  - can't do  food lion Cough:  sporadic Sleeping: sleeps fine bed flat/ 2 pillows  SABA use: none Says  tessalon works the best for cough    No obvious day to day or daytime variability or assoc e mucus plugs or hemoptysis or cp or chest tightness, subjective wheeze or overt sinus or hb symptoms.   Sleeping as above  without nocturnal  or early am exacerbation  of respiratory  c/o's or need for noct saba. Also denies any obvious fluctuation of symptoms with weather or environmental changes or other aggravating or alleviating factors except as outlined above   No unusual exposure hx or h/o childhood pna/ asthma or knowledge of premature birth.  Current Allergies, Complete Past Medical History, Past Surgical History, Family History, and Social History were reviewed in [place]  [facility] electronic medical record.  ROS  The following are not active complaints unless bolded Hoarseness, sore throat, dysphagia, dental problems, itching, sneezing,  nasal congestion or discharge of excess mucus or purulent secretions, ear ache,   fever, chills, sweats, unintended wt loss or wt gain, classically pleuritic or exertional cp,  orthopnea pnd or arm/hand swelling  or leg swelling, presyncope, palpitations, abdominal pain, anorexia, nausea, vomiting, diarrhea  or change in bowel habits or change in bladder habits, change in stools or change in urine, dysuria, hematuria,  rash, arthralgias, visual complaints, headache, numbness, weakness or ataxia or problems with walking or coordination,  change in mood or  memory.        Current Meds  Medication Sig  . acetaminophen (TYLENOL) 500 MG tablet Take 1,000 mg by mouth 2 (two) times daily. Per bottle as needed   . albuterol (PROVENTIL HFA;VENTOLIN HFA) 108 (90 Base) MCG/ACT inhaler Inhale 2 puffs into the lungs every 6 (six) hours as needed for wheezing or shortness of breath.  Marland Kitchen amitriptyline (ELAVIL) 50 MG tablet TAKE 1 TABLET BY MOUTH AT BEDTIME.  Marland Kitchen amLODipine (NORVASC) 5 MG tablet Take 1 tablet (5 mg total) by mouth daily. (Patient taking differently: Take 2.5 mg by  mouth daily. )  . buPROPion (WELLBUTRIN XL) 300 MG 24 hr tablet Take 1 tablet (300 mg total) by mouth every morning. Reported on 12/09/2015  . chlorpheniramine (CHLOR-TRIMETON) 4 MG tablet 1 tab by mouth daily at bedtime, may have 1 every 4 hours as needed  . DULoxetine (CYMBALTA) 20 MG capsule Take 1 capsule (20 mg total) by mouth daily.  . fluticasone (FLONASE) 50 MCG/ACT nasal spray Place 2 sprays into both nostrils daily.  . furosemide (LASIX) 40 MG tablet 1/2 to 1 tab daily for lower extremity swelling  . Glucose Blood (BLOOD GLUCOSE TEST STRIPS) STRP Use as directed. One Touch.  . hydrALAZINE (APRESOLINE) 50 MG tablet Take 2 tablets (100 mg total) by mouth 3 (three) times daily.  Marland Kitchen HYDROcodone-acetaminophen (NORCO/VICODIN) 5-325 MG tablet Take 1 tablet by mouth every 8 (eight) hours as needed for severe pain.   Marland Kitchen levothyroxine (SYNTHROID, LEVOTHROID) 88 MCG tablet TAKE 1 TABLET BY MOUTH DAILY.  Marland Kitchen LINZESS 290 MCG CAPS capsule TAKE 1 CAPSULE BY MOUTH DAILY BEFORE BREAKFAST.  . meloxicam (MOBIC) 15 MG tablet Take 15 mg by mouth daily.  . metFORMIN (GLUCOPHAGE) 500 MG tablet TAKE 1/2 TABLET DAILY WITH BREAKFAST BY MOUTH.  . Metoprolol Tartrate 75 MG TABS Take 75 mg by mouth 2 (two) times daily.  . montelukast (SINGULAIR) 10 MG tablet Take 1 tablet (10 mg total) by mouth at bedtime.  . Multiple Vitamin (MULTIVITAMIN) tablet Take 1 tablet by mouth daily. Reported on 12/09/2015  . RABEprazole (ACIPHEX) 20 MG tablet Take 1 tablet (20 mg total) by mouth 2 (two) times daily with a meal.  . sodium chloride (OCEAN) 0.65 % SOLN nasal spray Place 2 sprays into both nostrils every 4 (four) hours as needed for congestion.  Marland Kitchen spironolactone (ALDACTONE) 25 MG tablet Take 1 tablet (25 mg total) by mouth daily.  . vitamin B-12 (CYANOCOBALAMIN) 1000 MCG tablet Take 1,000 mcg by mouth daily.  . [  benzonatate (TESSALON) 100 MG capsule Take 1 capsule (100 mg total) by mouth 3 (three) times daily as needed for cough.   . [  dextromethorphan (DELSYM) 30 MG/5ML liquid 2tsp twice daily as needed for cough              Objective:  Physical Exam   amb obese hoarse  bf nad / walks with cane   07/19/2018       328  03/08/2018            337   02/08/18 (!) 333 lb (151 kg)  01/27/18 (!) 328 lb 9.6 oz (149.1 kg)  12/14/17 (!) 326 lb 6.4 oz (148.1 kg)     Vital signs reviewed - Note on arrival 02 sats  100% on RA      HEENT: nl dentition and oropharynx. Nl external ear canals without cough reflex -  mild bilateral non-specific turbinate edema     NECK :  without JVD/Nodes/TM/ nl carotid upstrokes bilaterally   LUNGS: no acc muscle use,  Nl contour chest which is clear to A and P bilaterally without cough on insp or exp maneuvers   CV:  RRR  no s3 or murmur or increase in P2, and  Min bil sym pedal edema  ABD:  Quite obese but soft and nontender with very limited inspiratory excursion in the supine position. No bruits or organomegaly appreciated, bowel sounds nl  MS:  Nl gait/ ext warm without deformities, calf tenderness, cyanosis or clubbing No obvious joint restrictions   SKIN: warm and dry without lesions    NEURO:  alert, approp, nl sensorium with  no motor or cerebellar deficits apparent.        I personally reviewed images and agree with radiology impression as follows:   Chest CTa 06/09/18 done for coronaries  Within the visualized portions of the thorax there are no suspicious appearing pulmonary nodules or masses, there is no acute consolidative airspace disease, no pleural effusions, no pneumothorax and no lymphadenopathy.     Assessment:

## 2018-07-19 NOTE — Patient Instructions (Signed)
Add back gabapentin 300 mg four times a day as per med calendar  Change tessilon to 200 mg up to every 8 hours as needed for cough  and stop delsym  If can't stop coughing then add hydrocodone up to every 4 hours if needed    If not better next step is to return to [facility]) then if not satisfied please return here  with all medications /inhalers/ solutions in hand so we can verify exactly what you are taking. This includes all medications from all doctors and over the [place] separate them into two bags:  the ones you take automatically, no matter what, vs the ones you take just when you feel you need them "BAG #2 is UP TO YOU"  - this will really help [name] help you take your medications more effectively.

## 2018-07-20 ENCOUNTER — Encounter: Payer: Self-pay | Admitting: Internal Medicine

## 2018-07-20 MED FILL — LINZESS 290 MCG CAPSULE: 290 | 30 days supply | Qty: 30 | Fill #1

## 2018-07-20 NOTE — Assessment & Plan Note (Addendum)
[facility]  09/29/16 LVEDP 27  03/08/2018   Walked RA x one lap @ 185 stopped due to  Tired > sob/ slow pace no desat - Spirometry 03/08/2018  FEV1 1.55 (63%)  Ratio 84 with min curvature - try off losartan/ coreg 03/08/2018  > no better as of 07/19/2018  - 07/19/2018   Walked RA x one lap @ 185 stopped due to sob with nl sats, fast pace considering she was using cane.   No evidence of a pulmonary mechanism causing limiting sob at this point

## 2018-07-20 NOTE — Assessment & Plan Note (Addendum)
Allergy profile 02/16/17  >  Eos 0.1 /  IgE  81 pos dust/ cockroach  - MCT 02/24/17 neg  - DgEs  01/20/18 : Small reducible hiatal hernia with trace gastroesophageal reflux. No evidence of stricture. 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 This was done in the context of a medication calendar review which provided the patient with a user-friendly unambiguous mechanism for medication administration and reconciliation and provides an action plan for all active problems. It is critical that this be shown to every doctor  for modification during the office visit if necessary so the patient can use it as a working document.   3) see [facility] asap since insurance won't cover sinus CT which is what I would want to obtain before risking side effects of additional abx including promoting resistant organisms and Frederika.  4) in meantime, Of the three most common causes of  Sub-acute / recurrent or chronic cough, only one (GERD, which we've documented she does have)  can actually contribute to/ trigger  the other two (asthma and post nasal drip syndrome)  and perpetuate the cylce of cough.  While not intuitively  obvious, many patients with chronic low grade reflux do not cough until there is a primary insult that disturbs the protective epithelial barrier and exposes sensitive nerve endings.   This is typically viral but can due to PNDS and  either may apply here.   The point is that once this occurs, it is difficult to eliminate the cycle  using anything but a maximally effective acid suppression regimen at least in the short run, accompanied by an appropriate diet to address non acid GERD and control / eliminate the cough itself for at least 3 days with hydrocodone if tessalon 200 does not eliminate the cough and urge to clear her throat.

## 2018-07-20 NOTE — Assessment & Plan Note (Addendum)
Body mass index is 49.87 kg/m.  -  trending down/ encouraged  Lab Results  Component Value Date   TSH 2.29 03/08/2018     Contributing to gerd risk/ doe/reviewed the need and the process to achieve and maintain neg calorie balance > defer f/u primary care including intermittently monitoring thyroid status      I had an extended discussion with the patient reviewing all relevant studies completed to date and  lasting 25 minutes of a 40  minute acute office visit addressing  severe non-specific but potentially very serious refractory respiratory symptoms of uncertain and potentially multiple  etiologies.   Each maintenance medication was reviewed in detail including most importantly the difference between maintenance and prns and under what circumstances the prns are to be triggered using an action plan format that is not reflected in the computer generated alphabetically organized AVS.    Please see AVS for specific instructions unique to this office visit that I personally wrote and verbalized to the the pt in detail and then reviewed with pt  by my nurse highlighting any changes in therapy/plan of care  recommended at today's visit.

## 2018-07-21 DIAGNOSIS — Z7189 Other specified counseling: Secondary | ICD-10-CM | POA: Diagnosis not present

## 2018-07-21 DIAGNOSIS — F331 Major depressive disorder, recurrent, moderate: Secondary | ICD-10-CM | POA: Diagnosis not present

## 2018-07-21 DIAGNOSIS — F54 Psychological and behavioral factors associated with disorders or diseases classified elsewhere: Secondary | ICD-10-CM | POA: Diagnosis not present

## 2018-07-25 ENCOUNTER — Other Ambulatory Visit: Payer: Self-pay | Admitting: Cardiovascular Disease

## 2018-07-26 MED FILL — SPIRONOLACTONE 25 MG TABLET: 25 | 30 days supply | Qty: 30 | Fill #0

## 2018-07-26 NOTE — Telephone Encounter (Signed)
Rx request sent to pharmacy.  

## 2018-08-01 MED FILL — METOPROLOL TARTRATE 75 MG T: 75 | 30 days supply | Qty: 60 | Fill #2

## 2018-08-01 MED FILL — BUPROPION HCL XL 300 MG TAB: 300 | 30 days supply | Qty: 30 | Fill #9

## 2018-08-01 MED FILL — CELECOXIB 100 MG CAPSULE: 100 | 30 days supply | Qty: 60 | Fill #1

## 2018-08-01 MED FILL — metFORMIN HCL 500 MG TABS: 500 | 30 days supply | Qty: 15 | Fill #2

## 2018-08-02 MED FILL — FUROSEMIDE 40 MG TAB: 40 | 30 days supply | Qty: 30 | Fill #5

## 2018-08-02 MED FILL — DULoxetine HCL 20 MG CPEP: 20 | 30 days supply | Qty: 30 | Fill #1

## 2018-08-02 MED FILL — LEVOTHYROXINE 88 MCG TABLET: 88 | 30 days supply | Qty: 30 | Fill #2

## 2018-08-02 MED FILL — RABEPRAZOLE SOD DR 20 MG TA: 20 | 30 days supply | Qty: 60 | Fill #3

## 2018-08-02 MED FILL — hydrALAZINE HCL 50 MG TABS: 50 | 30 days supply | Qty: 180 | Fill #2

## 2018-08-04 ENCOUNTER — Ambulatory Visit: Payer: Medicaid Other | Admitting: Internal Medicine

## 2018-08-08 ENCOUNTER — Other Ambulatory Visit: Payer: Self-pay | Admitting: Internal Medicine

## 2018-08-08 DIAGNOSIS — I10 Essential (primary) hypertension: Secondary | ICD-10-CM

## 2018-08-08 DIAGNOSIS — R7303 Prediabetes: Secondary | ICD-10-CM

## 2018-08-08 DIAGNOSIS — N898 Other specified noninflammatory disorders of vagina: Secondary | ICD-10-CM | POA: Diagnosis not present

## 2018-08-08 MED FILL — ACCU-CHEK AVIVA PLUS TEST S: 90 days supply | Qty: 100 | Fill #0

## 2018-08-09 MED FILL — AMITRIPTYLINE HCL 50 MG TAB: 50 | 30 days supply | Qty: 30 | Fill #0

## 2018-08-12 DIAGNOSIS — G894 Chronic pain syndrome: Secondary | ICD-10-CM | POA: Diagnosis not present

## 2018-08-12 DIAGNOSIS — M25551 Pain in right hip: Secondary | ICD-10-CM | POA: Diagnosis not present

## 2018-08-12 DIAGNOSIS — M25561 Pain in right knee: Secondary | ICD-10-CM | POA: Diagnosis not present

## 2018-08-12 DIAGNOSIS — M25552 Pain in left hip: Secondary | ICD-10-CM | POA: Diagnosis not present

## 2018-08-12 DIAGNOSIS — M79672 Pain in left foot: Secondary | ICD-10-CM | POA: Diagnosis not present

## 2018-08-12 DIAGNOSIS — M79641 Pain in right hand: Secondary | ICD-10-CM | POA: Diagnosis not present

## 2018-08-12 DIAGNOSIS — M79642 Pain in left hand: Secondary | ICD-10-CM | POA: Diagnosis not present

## 2018-08-12 DIAGNOSIS — M79671 Pain in right foot: Secondary | ICD-10-CM | POA: Diagnosis not present

## 2018-08-12 DIAGNOSIS — M25562 Pain in left knee: Secondary | ICD-10-CM | POA: Diagnosis not present

## 2018-08-12 DIAGNOSIS — M545 Low back pain: Secondary | ICD-10-CM | POA: Diagnosis not present

## 2018-08-17 MED FILL — MONTELUKAST SOD 10 MG TAB: 10 | 30 days supply | Qty: 30 | Fill #1

## 2018-08-17 MED FILL — GABAPENTIN 300 MG CAPSULE: 300 | 30 days supply | Qty: 120 | Fill #1

## 2018-08-18 DIAGNOSIS — N3281 Overactive bladder: Secondary | ICD-10-CM | POA: Diagnosis not present

## 2018-08-18 DIAGNOSIS — N898 Other specified noninflammatory disorders of vagina: Secondary | ICD-10-CM | POA: Diagnosis not present

## 2018-08-18 DIAGNOSIS — N952 Postmenopausal atrophic vaginitis: Secondary | ICD-10-CM | POA: Diagnosis not present

## 2018-08-23 MED FILL — PREMARIN VAGINAL CREAM-APPL: 0.625 | 10 days supply | Qty: 30 | Fill #0

## 2018-08-29 ENCOUNTER — Other Ambulatory Visit: Payer: Self-pay | Admitting: Internal Medicine

## 2018-08-29 MED FILL — LINZESS 290 MCG CAPSULE: 290 | 30 days supply | Qty: 30 | Fill #2

## 2018-08-29 MED FILL — BUPROPION HCL XL 300 MG TAB: 300 | 30 days supply | Qty: 30 | Fill #10

## 2018-08-29 MED FILL — SPIRONOLACTONE 25 MG TABLET: 25 | 30 days supply | Qty: 30 | Fill #1

## 2018-08-29 MED FILL — metFORMIN HCL 500 MG TABS: 500 | 30 days supply | Qty: 15 | Fill #0

## 2018-08-29 MED FILL — LEVOTHYROXINE 88 MCG TABLET: 88 | 30 days supply | Qty: 30 | Fill #0

## 2018-09-05 MED FILL — AMITRIPTYLINE HCL 50 MG TAB: 50 | 30 days supply | Qty: 30 | Fill #1

## 2018-09-05 MED FILL — RABEPRAZOLE SOD DR 20 MG TA: 20 | 30 days supply | Qty: 60 | Fill #4

## 2018-09-05 MED FILL — METOPROLOL TARTRATE 75 MG T: 75 | 30 days supply | Qty: 60 | Fill #3

## 2018-09-06 DIAGNOSIS — Z6841 Body Mass Index (BMI) 40.0 and over, adult: Secondary | ICD-10-CM | POA: Diagnosis not present

## 2018-09-06 DIAGNOSIS — R7303 Prediabetes: Secondary | ICD-10-CM | POA: Diagnosis not present

## 2018-09-06 DIAGNOSIS — F331 Major depressive disorder, recurrent, moderate: Secondary | ICD-10-CM | POA: Diagnosis not present

## 2018-09-06 DIAGNOSIS — I10 Essential (primary) hypertension: Secondary | ICD-10-CM | POA: Diagnosis not present

## 2018-09-08 ENCOUNTER — Encounter: Payer: Self-pay | Admitting: Internal Medicine

## 2018-09-08 ENCOUNTER — Ambulatory Visit: Payer: Medicaid Other | Attending: Internal Medicine | Admitting: Internal Medicine

## 2018-09-08 VITALS — BP 162/92 | HR 92 | Temp 98.0°F | Resp 16 | Wt 335.2 lb

## 2018-09-08 DIAGNOSIS — J069 Acute upper respiratory infection, unspecified: Secondary | ICD-10-CM

## 2018-09-08 DIAGNOSIS — M1990 Unspecified osteoarthritis, unspecified site: Secondary | ICD-10-CM | POA: Diagnosis not present

## 2018-09-08 DIAGNOSIS — Z883 Allergy status to other anti-infective agents status: Secondary | ICD-10-CM | POA: Insufficient documentation

## 2018-09-08 DIAGNOSIS — M150 Primary generalized (osteo)arthritis: Secondary | ICD-10-CM

## 2018-09-08 DIAGNOSIS — I10 Essential (primary) hypertension: Secondary | ICD-10-CM | POA: Diagnosis not present

## 2018-09-08 DIAGNOSIS — Z7984 Long term (current) use of oral hypoglycemic drugs: Secondary | ICD-10-CM | POA: Diagnosis not present

## 2018-09-08 DIAGNOSIS — F341 Dysthymic disorder: Secondary | ICD-10-CM | POA: Diagnosis not present

## 2018-09-08 DIAGNOSIS — Z6841 Body Mass Index (BMI) 40.0 and over, adult: Secondary | ICD-10-CM | POA: Insufficient documentation

## 2018-09-08 DIAGNOSIS — K219 Gastro-esophageal reflux disease without esophagitis: Secondary | ICD-10-CM | POA: Insufficient documentation

## 2018-09-08 DIAGNOSIS — F419 Anxiety disorder, unspecified: Secondary | ICD-10-CM | POA: Diagnosis not present

## 2018-09-08 DIAGNOSIS — Z87891 Personal history of nicotine dependence: Secondary | ICD-10-CM | POA: Diagnosis not present

## 2018-09-08 DIAGNOSIS — Z79899 Other long term (current) drug therapy: Secondary | ICD-10-CM | POA: Insufficient documentation

## 2018-09-08 DIAGNOSIS — E785 Hyperlipidemia, unspecified: Secondary | ICD-10-CM | POA: Diagnosis not present

## 2018-09-08 DIAGNOSIS — M797 Fibromyalgia: Secondary | ICD-10-CM | POA: Diagnosis not present

## 2018-09-08 DIAGNOSIS — G4733 Obstructive sleep apnea (adult) (pediatric): Secondary | ICD-10-CM | POA: Diagnosis not present

## 2018-09-08 DIAGNOSIS — Z888 Allergy status to other drugs, medicaments and biological substances status: Secondary | ICD-10-CM | POA: Diagnosis not present

## 2018-09-08 DIAGNOSIS — M159 Polyosteoarthritis, unspecified: Secondary | ICD-10-CM

## 2018-09-08 DIAGNOSIS — R7303 Prediabetes: Secondary | ICD-10-CM

## 2018-09-08 LAB — GLUCOSE, POCT (MANUAL RESULT ENTRY): POC Glucose: 104 mg/dl — AB (ref 70–99)

## 2018-09-08 MED ORDER — AMLODIPINE BESYLATE 5 MG PO TABS: 5.0000 mg | ORAL_TABLET | Freq: Every day | ORAL | 1 refills | Status: DC

## 2018-09-08 MED FILL — AMLODIPINE BESYLATE 5 MG TA: 5 | 90 days supply | Qty: 90 | Fill #0

## 2018-09-08 NOTE — Progress Notes (Signed)
Patient ID: Robin [name], female    DOB: 1956/09/18  MRN: [id number]  CC: Diabetes and Hypertension   Subjective: Robin [name] is a 62 y.o. female who presents for chronic ds management Her concerns today include:  Patient with history ofHTN, pre-DM,OSA, fibromyalgia, depression, obesity, GERD, hypothyroid, OA of multiple js (hands, knees, shoulders)  C/o congestion, body aches and eyes, ears and throat hurt x 1 wk. Slight fever.  Not using anything for her symptoms.  HTN:  Checking BP in a.m before meds.  Has log - 172/88,, 157/70, 122/66, 164/66, 131/68, 119/56 Compliant with meds and limits salt in foods.  Low-dose amlodipine was added by cardiology in September.  She is taking 5 mg half a tablet daily.  She denies any lower extremity edema.  Of note gabapentin was also added back by her pulmonologist Dr. Melvyn Novas for cough.  She feels that the gabapentin helps some with the cough.  OA:  Celebrex 100 mg BID added by her Rheumatologist Dr. Posey Pronto.  She thinks BP has been a little worse with the Celebrex.  Meloxicam still on med list but patient states that she has been off of that for a while.  Also on Hydrocodone through pain management. She does not fine it too helpful for her pain.  Obesity:  Gain 7 lbs back since October.  She reports that her husband is being treated for metastatic cancer and this has been stressful for her.  She has seen a nutritionist.  "I am a stress eater." Saw bariatric clinic Dr. Deon Pilling and she is contemplating lap band surgery.  She was referred to attend a seminar first and has a follow-up appointment in January.  Depression: sees a counselor Q 2 mths.  Has appt tomorrow.  She is on Wellbutrin and Cymbalta.  Patient Active Problem List   Diagnosis Date Noted  . Gastritis and gastroduodenitis 04/26/2018  . Atrophic vaginitis 04/01/2018  . Diabetes mellitus ([place]) 03/31/2018  . Upper airway cough syndrome 02/08/2018  . Dysphagia 12/14/2017  . Generalized  OA 07/30/2017  . Urinary incontinence 07/23/2017  . Early satiety 06/16/2017  . Hx of iron deficiency anemia 05/28/2017  . Throat pain in adult 04/05/2017  . Prediabetes 02/04/2017  . Dyspnea on exertion 12/23/2016  . Chronic allergic rhinitis 12/23/2016  . Hemoptysis 12/23/2016  . Fibromyalgia 08/18/2016  . Chronic lymphocytic thyroiditis 08/18/2016  . Depression 04/01/2016  . OSA (obstructive sleep apnea) 04/01/2016  . Essential hypertension 12/09/2015  . Hypothyroidism 12/09/2015  . Morbid obesity due to excess calories ([place]) 12/09/2015  . Constipation 05/27/2015  . Fatty liver 05/27/2015  . Anxiety 06/09/2012  . History of gastroesophageal reflux (GERD) 06/09/2012  . Hyperlipidemia 06/09/2012  . Refusal of blood transfusions as patient is Jehovah's Witness 06/09/2012  . 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 Abnormal small bowel biopsy 09/18/2011  . Lactose intolerance 09/18/2011  . GERD 01/21/2010     Current Outpatient Medications on File Prior to Visit  Medication Sig Dispense Refill  . celecoxib (CELEBREX) 100 MG capsule Take 100 mg by mouth 2 (two) times daily.    Marland Kitchen ACCU-CHEK AVIVA PLUS test strip USE AS DIRECTED 100 each 12  . acetaminophen (TYLENOL) 500 MG tablet Take 1,000 mg by mouth 2 (two) times daily. Per bottle as needed     . albuterol (PROVENTIL HFA;VENTOLIN HFA) 108 (90 Base) MCG/ACT inhaler Inhale 2 puffs into the lungs every 6 (six) hours as needed for wheezing or shortness of breath. 1 Inhaler 2  .  amitriptyline (ELAVIL) 50 MG tablet TAKE 1 TABLET BY MOUTH AT BEDTIME. 30 tablet 2  . benzonatate (TESSALON) 200 MG capsule Take 1 capsule (200 mg total) by mouth 3 (three) times daily as needed for cough. 45 capsule 2  . buPROPion (WELLBUTRIN XL) 300 MG 24 hr tablet Take 1 tablet (300 mg total) by mouth every morning. Reported on 12/09/2015 90 tablet 3  . chlorpheniramine (CHLOR-TRIMETON) 4 MG tablet 1 tab by mouth daily at bedtime, may have 1 every 4 hours as  needed    . DULoxetine (CYMBALTA) 20 MG capsule Take 1 capsule (20 mg total) by mouth daily. 30 capsule 6  . fluticasone (FLONASE) 50 MCG/ACT nasal spray Place 2 sprays into both nostrils daily. 16 g 6  . furosemide (LASIX) 40 MG tablet 1/2 to 1 tab daily for lower extremity swelling 30 tablet 6  . gabapentin (NEURONTIN) 300 MG capsule Take 1 capsule (300 mg total) by mouth 4 (four) times daily. 120 capsule 2  . hydrALAZINE (APRESOLINE) 50 MG tablet Take 2 tablets (100 mg total) by mouth 3 (three) times daily. 180 tablet 5  . HYDROcodone-acetaminophen (NORCO/VICODIN) 5-325 MG tablet Take 1 tablet by mouth every 8 (eight) hours as needed for severe pain.     Marland Kitchen levothyroxine (SYNTHROID, LEVOTHROID) 88 MCG tablet TAKE 1 TABLET BY MOUTH DAILY. 30 tablet 2  . LINZESS 290 MCG CAPS capsule TAKE 1 CAPSULE BY MOUTH DAILY BEFORE BREAKFAST. 30 capsule 5  . metFORMIN (GLUCOPHAGE) 500 MG tablet TAKE 1/2 TABLET DAILY WITH BREAKFAST BY MOUTH. 15 tablet 2  . Metoprolol Tartrate 75 MG TABS Take 75 mg by mouth 2 (two) times daily. 60 tablet 3  . montelukast (SINGULAIR) 10 MG tablet Take 1 tablet (10 mg total) by mouth at bedtime. 30 tablet 4  . Multiple Vitamin (MULTIVITAMIN) tablet Take 1 tablet by mouth daily. Reported on 12/09/2015 90 tablet 1  . RABEprazole (ACIPHEX) 20 MG tablet Take 1 tablet (20 mg total) by mouth 2 (two) times daily with a meal. 180 tablet 3  . sodium chloride (OCEAN) 0.65 % SOLN nasal spray Place 2 sprays into both nostrils every 4 (four) hours as needed for congestion.    Marland Kitchen spironolactone (ALDACTONE) 25 MG tablet TAKE 1 TABLET (25 MG TOTAL) BY MOUTH DAILY. 30 tablet 1  . vitamin B-12 (CYANOCOBALAMIN) 1000 MCG tablet Take 1,000 mcg by mouth daily.     No current facility-administered medications on file prior to visit.     Allergies  Allergen Reactions  . Celexa [Citalopram Hydrobromide] Other (See Comments)    "Made me feel out of it"  . Gabapentin     Contributed to lower extremity  edema  . Norvasc [Amlodipine Besylate]     Edema in lower extremities  . Diflucan [Fluconazole] Rash    Social History   Socioeconomic History  . Marital status: Married    Spouse name: Not on file  . Number of children: 4  . Years of education: 10  . Highest education level: Not on file  Occupational History    Employer: UNEMPLOYED  Social Needs  . Financial resource strain: Not on file  . Food insecurity:    Worry: Not on file    Inability: Not on file  . Transportation needs:    Medical: Not on file    Non-medical: Not on file  Tobacco Use  . Smoking status: Former Smoker    Packs/day: 0.50    Years: 10.00    Pack years:  5.00    Types: Cigarettes    Start date: 07/14/1975    Last attempt to quit: 04/04/1993    Years since quitting: 25.4  . Smokeless tobacco: Never Used  . Tobacco comment: 17 yrs ago  Substance and Sexual Activity  . Alcohol use: No    Alcohol/week: 0.0 standard drinks  . Drug use: No  . Sexual activity: Not on file  Lifestyle  . Physical activity:    Days per week: Not on file    Minutes per session: Not on file  . Stress: Not on file  Relationships  . Social connections:    Talks on phone: Not on file    Gets together: Not on file    Attends religious service: Not on file    Active member of club or organization: Not on file    Attends meetings of clubs or organizations: Not on file    Relationship status: Not on file  . Intimate partner violence:    Fear of current or ex partner: Not on file    Emotionally abused: Not on file    Physically abused: Not on file    Forced sexual activity: Not on file  Other Topics Concern  . Not on file  Social History Narrative   Lives with husband in an apartment on the first floor.  Has 4 children.     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  Trying to get disability.  Formerly worked as a Teacher, early years/pre.  Education: 11th grade.      [facility] (12/23/16):   Originally  from [place] [facility]. She was raised in [place]. 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 No pets currently. No bird exposure. She does have mold in her current home in the bathroom, laundry room, & master bedroom.     Family History  Problem Relation Age of Onset  . Kidney cancer Father   . Hypertension Father   . Hyperlipidemia Father   . Sleep apnea Father   . Diabetes Mother   . Hypertension Mother   . Heart Problems Mother   . Hyperlipidemia Mother   . Asthma Mother   . Sleep apnea Mother   . Liver disease Brother   . Arthritis Brother   . Hypertension Sister   . Allergic rhinitis Sister   . Stomach cancer Unknown        aunt  . Breast cancer Maternal Grandmother   . Kidney disease Maternal Grandfather   . Breast cancer Paternal Grandmother   . Hypertension Brother   . Hyperlipidemia Brother   . Hypertension Brother   . Hypertension Sister   . Hyperlipidemia Sister   . Lupus Maternal Aunt   . Colon cancer Neg Hx   . Eczema Neg Hx   . Immunodeficiency Neg Hx   . Urticaria Neg Hx     Past Surgical History:  Procedure Laterality Date  . ABDOMINAL HYSTERECTOMY    . ABDOMINAL HYSTERECTOMY  02/18/2000  . CARDIAC CATHETERIZATION  08/18/2003   normal L main/LAD/L Cfx/RCA (Dr. Adora Fridge)  . COLONOSCOPY  2006   Dr. [name], hyperplastic polyps  . COLONOSCOPY  08/06/11   abnormal terminal ileum for 10cm, erosions, geographical ulceration. Bx small bowel mucosa with prominent intramucosal lymphoid aggregates, slightly inflammed  . DIRECT LARYNGOSCOPY  03/15/2012   Procedure: DIRECT LARYNGOSCOPY;  Surgeon: [name], MD;  Location: [place];  Service: ENT;  Laterality: N/A; Dr. Wolicki--:>no foreign body seen. normal esophagus to 40cm  . ESOPHAGEAL DILATION  04/17/2015   Procedure: ESOPHAGEAL DILATION;  Surgeon: [name], MD;  [facility];   Service: Endoscopy;;  . ESOPHAGOGASTRODUODENOSCOPY  08/23/2008   EQA:STMH distal esophageal erosions consistent with mild erosive reflux esophagitis, otherwise unremarkable esophagus/ Tiny antral erosions of doubtful clinical significance, otherwise normal stomach, patent pylorus, normal D1 and D2  . ESOPHAGOGASTRODUODENOSCOPY  08/06/11   small hh, noncritical Schatzki's ring s/p 49 F  . ESOPHAGOGASTRODUODENOSCOPY N/A 04/17/2015   DQQ:IWLNLG s/p dilation  . ESOPHAGOGASTRODUODENOSCOPY (EGD) WITH PROPOFOL N/A 01/20/2018   Dr. [name]: Erosive gastropathy, normal-appearing esophagus status post empiric dilation.  Chronic gastritis, no H. pylori.  . ESOPHAGOSCOPY  03/15/2012   Procedure: ESOPHAGOSCOPY;  Surgeon: [name], MD;  Location: [place];  Service: ENT;  Laterality: N/A;  . KNEE ARTHROSCOPY  04/27/2001  . MALONEY DILATION N/A 01/20/2018   Procedure: Venia Minks DILATION;  Surgeon: [name], MD;  [facility];  Service: Endoscopy;  Laterality: N/A;  . [place]  2003   negative bruce protocol exercise tress test; EF 68%; intermediate risk study due to evidence of anterior wall ischemia extending from mid-ventricle to apex  . PITUITARY SURGERY  06/2012   benign tumor, surgeon at [facility]  . RECTOCELE REPAIR    . TRANSTHORACIC ECHOCARDIOGRAM  2003   EF normal  . VAGINAL PROLAPSE REPAIR    . VIDEO BRONCHOSCOPY Bilateral 03/08/2017   Procedure: VIDEO BRONCHOSCOPY WITHOUT FLUORO;  Surgeon: Javier Glazier, MD;  Location: [place];  Service: Cardiopulmonary;  Laterality: Bilateral;    ROS: Review of Systems Negative except as above PHYSICAL EXAM: BP (!) 162/92   Pulse 92   Temp 98 F (36.7 C) (Oral)   Resp 16   Wt (!) 335 lb 3.2 oz (152 kg)   SpO2 98%   BMI 50.97 kg/m   Wt Readings from Last 3 Encounters:  09/08/18 (!) 335 lb 3.2 oz (152 kg)  07/19/18 (!) 328 lb (148.8 kg)  07/11/18 (!) 336 lb 9.6 oz (152.7 kg)    Physical Exam  General  appearance - alert, well appearing, morbidly obese older African-American female and in no distress Mental status - normal mood, behavior, speech, dress, motor activity, and thought processes Nose - normal and patent, no erythema, discharge or polyps Mouth - mucous membranes moist, pharynx normal without lesions Neck - supple, no significant adenopathy Chest - clear to auscultation, no wheezes, rales or rhonchi, symmetric air entry Heart - normal rate, regular rhythm, normal S1, S2, no murmurs, rubs, clicks or gallops Extremities -no lower extremity edema  Depression screen [facility] 2/9 09/08/2018 07/11/2018 06/09/2018  Decreased Interest 3 2 2   Down, Depressed, Hopeless 2 3 -  PHQ - 2 Score 5 5 2   Altered sleeping 1 2 2   Tired, decreased energy 3 3 3   Change in appetite 2 2 2   Feeling bad or failure about yourself  0 - 0  Trouble concentrating 1 0 2  Moving slowly or fidgety/restless 3 2 2   Suicidal thoughts 0 0 0  PHQ-9 Score 15 14 13   Some recent data might be hidden     Results for orders placed or performed in visit on 09/08/18  POCT glucose (manual entry)  Result Value Ref Range   POC Glucose 104 (A)  70 - 99 mg/dl    ASSESSMENT AND PLAN:  1. Essential hypertension Not at goal.  She has had some problems with lower extremity edema in the past on amlodipine.  However she seems to be tolerating 2.5 mg.  Recommend increase to 5 mg.  Patient to monitor for any lower extremity edema.  Continue to check blood pressure with goal of 130/80 or lower. - amLODipine (NORVASC) 5 MG tablet; Take 1 tablet (5 mg total) by mouth daily.  Dispense: 90 tablet; Refill: 1 - Basic Metabolic Panel  2. Prediabetes Continue to encourage healthy eating habits.  Advised eating healthy snacks and try to avoid overeating - POCT glucose (manual entry)  3. Persistent depressive disorder Followed by a behavioral health specialist.  She has follow-up appointment tomorrow  4. Upper respiratory tract infection,  unspecified type Conservative management recommended.  She can try Coricidin HBP over-the-counter  5. Morbid obesity ([place]) See #2 above.  She is being followed by a nutritionist.  She is also contemplating weight reduction surgery.  6. Primary osteoarthritis involving multiple joints Meloxicam taken off of med list.  I have added Celebrex   Patient was given the opportunity to ask questions.  Patient verbalized understanding of the plan and was able to repeat key elements of the plan.   Orders Placed This Encounter  Procedures  . Basic Metabolic Panel  . POCT glucose (manual entry)     Requested Prescriptions   Signed Prescriptions Disp Refills  . amLODipine (NORVASC) 5 MG tablet 90 tablet 1    Sig: Take 1 tablet (5 mg total) by mouth daily.    Return in about 3 months (around 12/08/2018).  Karle Plumber, MD, FACP

## 2018-09-08 NOTE — Patient Instructions (Signed)
Increase amlodipine to 5 mg daily.  Continue to limit salt in the foods.  You can try Coricidin HBP over-the-counter as needed for cough/cold symptoms.

## 2018-09-09 DIAGNOSIS — Z7189 Other specified counseling: Secondary | ICD-10-CM | POA: Diagnosis not present

## 2018-09-09 DIAGNOSIS — F331 Major depressive disorder, recurrent, moderate: Secondary | ICD-10-CM | POA: Diagnosis not present

## 2018-09-09 DIAGNOSIS — F54 Psychological and behavioral factors associated with disorders or diseases classified elsewhere: Secondary | ICD-10-CM | POA: Diagnosis not present

## 2018-09-09 DIAGNOSIS — F329 Major depressive disorder, single episode, unspecified: Secondary | ICD-10-CM | POA: Diagnosis not present

## 2018-09-09 LAB — BASIC METABOLIC PANEL
BUN/Creatinine Ratio: 12 (ref 12–28)
BUN: 10 mg/dL (ref 8–27)
CO2: 25 mmol/L (ref 20–29)
CREATININE: 0.82 mg/dL (ref 0.57–1.00)
Calcium: 10 mg/dL (ref 8.7–10.3)
Chloride: 100 mmol/L (ref 96–106)
GFR calc Af Amer: 90 mL/min/{1.73_m2} (ref >59)
GFR calc non Af Amer: 78 mL/min/{1.73_m2} (ref >59)
GLUCOSE: 93 mg/dL (ref 65–99)
Potassium: 4.4 mmol/L (ref 3.5–5.2)
Sodium: 139 mmol/L (ref 134–144)

## 2018-09-13 DIAGNOSIS — Z713 Dietary counseling and surveillance: Secondary | ICD-10-CM | POA: Diagnosis not present

## 2018-09-13 DIAGNOSIS — Z6841 Body Mass Index (BMI) 40.0 and over, adult: Secondary | ICD-10-CM | POA: Diagnosis not present

## 2018-09-13 DIAGNOSIS — G9009 Other idiopathic peripheral autonomic neuropathy: Secondary | ICD-10-CM | POA: Diagnosis not present

## 2018-09-13 DIAGNOSIS — G903 Multi-system degeneration of the autonomic nervous system: Secondary | ICD-10-CM | POA: Diagnosis not present

## 2018-09-13 DIAGNOSIS — G603 Idiopathic progressive neuropathy: Secondary | ICD-10-CM | POA: Diagnosis not present

## 2018-09-13 DIAGNOSIS — E669 Obesity, unspecified: Secondary | ICD-10-CM | POA: Diagnosis not present

## 2018-09-16 ENCOUNTER — Telehealth: Payer: Self-pay

## 2018-09-16 NOTE — Telephone Encounter (Signed)
Contacted pt to go over lab results pt is aware and doesn't have any questions or concerns 

## 2018-09-19 MED FILL — DULoxetine HCL 20 MG CPEP: 20 | 30 days supply | Qty: 30 | Fill #2

## 2018-09-19 MED FILL — MONTELUKAST SOD 10 MG TAB: 10 | 30 days supply | Qty: 30 | Fill #2

## 2018-09-19 MED FILL — CELECOXIB 100 MG CAPSULE: 100 | 30 days supply | Qty: 60 | Fill #2

## 2018-09-19 MED FILL — GABAPENTIN 300 MG CAPSULE: 300 | 30 days supply | Qty: 120 | Fill #2

## 2018-09-22 ENCOUNTER — Telehealth: Payer: Self-pay | Admitting: Internal Medicine

## 2018-09-22 NOTE — Telephone Encounter (Signed)
[facility] call to inform us that the patients PHQ 9 was 16.

## 2018-09-27 DIAGNOSIS — M545 Low back pain: Secondary | ICD-10-CM | POA: Diagnosis not present

## 2018-09-27 DIAGNOSIS — M79671 Pain in right foot: Secondary | ICD-10-CM | POA: Diagnosis not present

## 2018-09-27 DIAGNOSIS — G894 Chronic pain syndrome: Secondary | ICD-10-CM | POA: Diagnosis not present

## 2018-09-27 DIAGNOSIS — M25562 Pain in left knee: Secondary | ICD-10-CM | POA: Diagnosis not present

## 2018-09-27 DIAGNOSIS — M25561 Pain in right knee: Secondary | ICD-10-CM | POA: Diagnosis not present

## 2018-09-29 NOTE — Telephone Encounter (Signed)
LCSWA placed call to patient to follow up on behavioral health screening. LCSWA introduced self and explained role at [facility].   Pt shared that her behavioral health is triggered by "a variety" of things. LCSWA validated pt and explained how one's physical and mental health are correlated, in addition, to how stress can negatively impact both. Pt verbalized understanding. She is currently participating in psychotherapy (initiated services in 07/2018) Pt has an upcoming appointment scheduled for tomorrow. She states that she is not taking any medication at this time.   LCSWA discussed strategies to assist in the decrease and/or management of symptoms. Pt was strongly encouraged to contact PCP and/or LCSWA if symptoms worsen or fail to improve. No additional concerns noted.

## 2018-09-30 ENCOUNTER — Other Ambulatory Visit: Payer: Self-pay | Admitting: Cardiovascular Disease

## 2018-09-30 ENCOUNTER — Ambulatory Visit: Payer: Medicaid Other | Admitting: Podiatry

## 2018-09-30 DIAGNOSIS — E1142 Type 2 diabetes mellitus with diabetic polyneuropathy: Secondary | ICD-10-CM

## 2018-09-30 DIAGNOSIS — M79674 Pain in right toe(s): Secondary | ICD-10-CM | POA: Diagnosis not present

## 2018-09-30 DIAGNOSIS — M79675 Pain in left toe(s): Secondary | ICD-10-CM

## 2018-09-30 DIAGNOSIS — B351 Tinea unguium: Secondary | ICD-10-CM | POA: Diagnosis not present

## 2018-09-30 DIAGNOSIS — L84 Corns and callosities: Secondary | ICD-10-CM

## 2018-09-30 DIAGNOSIS — Z7189 Other specified counseling: Secondary | ICD-10-CM | POA: Diagnosis not present

## 2018-09-30 DIAGNOSIS — F54 Psychological and behavioral factors associated with disorders or diseases classified elsewhere: Secondary | ICD-10-CM | POA: Diagnosis not present

## 2018-09-30 DIAGNOSIS — F331 Major depressive disorder, recurrent, moderate: Secondary | ICD-10-CM | POA: Diagnosis not present

## 2018-09-30 DIAGNOSIS — F329 Major depressive disorder, single episode, unspecified: Secondary | ICD-10-CM | POA: Diagnosis not present

## 2018-09-30 MED FILL — SPIRONOLACTONE 25 MG TABLET: 25 | 90 days supply | Qty: 90 | Fill #0

## 2018-09-30 MED FILL — metFORMIN HCL 500 MG TABS: 500 | 30 days supply | Qty: 15 | Fill #1

## 2018-09-30 MED FILL — LEVOTHYROXINE 88 MCG TABLET: 88 | 30 days supply | Qty: 30 | Fill #1

## 2018-09-30 NOTE — Patient Instructions (Signed)
Diabetes Mellitus and Foot Care Foot care is an important part of your health, especially when you have diabetes. Diabetes may cause you to have problems because of poor blood flow (circulation) to your feet and legs, which can cause your skin to:  Become thinner and drier.  Break more easily.  Heal more slowly.  Peel and crack. You may also have nerve damage (neuropathy) in your legs and feet, causing decreased feeling in them. This means that you may not notice minor injuries to your feet that could lead to more serious problems. Noticing and addressing any potential problems early is the best way to prevent future foot problems. How to care for your feet Foot hygiene  Wash your feet daily with warm water and mild soap. Do not use hot water. Then, pat your feet and the areas between your toes until they are completely dry. Do not soak your feet as this can dry your skin.  Trim your toenails straight across. Do not dig under them or around the cuticle. File the edges of your nails with an emery board or nail file.  Apply a moisturizing lotion or petroleum jelly to the skin on your feet and to dry, brittle toenails. Use lotion that does not contain alcohol and is unscented. Do not apply lotion between your toes. Shoes and socks  Wear clean socks or stockings every day. Make sure they are not too tight. Do not wear knee-high stockings since they may decrease blood flow to your legs.  Wear shoes that fit properly and have enough cushioning. Always look in your shoes before you put them on to be sure there are no objects inside.  To break in new shoes, wear them for just a few hours a day. This prevents injuries on your feet. Wounds, scrapes, corns, and calluses  Check your feet daily for blisters, cuts, bruises, sores, and redness. If you cannot see the bottom of your feet, use a mirror or ask someone for help.  Do not cut corns or calluses or try to remove them with medicine.  If you  find a minor scrape, cut, or break in the skin on your feet, keep it and the skin around it clean and dry. You may clean these areas with mild soap and water. Do not clean the area with peroxide, alcohol, or iodine.  If you have a wound, scrape, corn, or callus on your foot, look at it several times a day to make sure it is healing and not infected. Check for: ? Redness, swelling, or pain. ? Fluid or blood. ? Warmth. ? Pus or a bad smell. General instructions  Do not cross your legs. This may decrease blood flow to your feet.  Do not use heating pads or hot water bottles on your feet. They may burn your skin. If you have lost feeling in your feet or legs, you may not know this is happening until it is too late.  Protect your feet from hot and cold by wearing shoes, such as at the beach or on hot pavement.  Schedule a complete foot exam at least once a year (annually) or more often if you have foot problems. If you have foot problems, report any cuts, sores, or bruises to your health care provider immediately. Contact a health care provider if:  You have a medical condition that increases your risk of infection and you have any cuts, sores, or bruises on your feet.  You have an injury that is not   healing.  You have redness on your legs or feet.  You feel burning or tingling in your legs or feet.  You have pain or cramps in your legs and feet.  Your legs or feet are numb.  Your feet always feel cold.  You have pain around a toenail. Get help right away if:  You have a wound, scrape, corn, or callus on your foot and: ? You have pain, swelling, or redness that gets worse. ? You have fluid or blood coming from the wound, scrape, corn, or callus. ? Your wound, scrape, corn, or callus feels warm to the touch. ? You have pus or a bad smell coming from the wound, scrape, corn, or callus. ? You have a fever. ? You have a red line going up your leg. Summary  Check your feet every day  for cuts, sores, red spots, swelling, and blisters.  Moisturize feet and legs daily.  Wear shoes that fit properly and have enough cushioning.  If you have foot problems, report any cuts, sores, or bruises to your health care provider immediately.  Schedule a complete foot exam at least once a year (annually) or more often if you have foot problems. This information is not intended to replace advice given to you by your health care provider. Make sure you discuss any questions you have with your health care provider. Document Released: 09/18/2000 Document Revised: 11/03/2017 Document Reviewed: 10/23/2016 Elsevier Interactive Patient Education  2019 Elsevier Inc.  Onychomycosis/Fungal Toenails  WHAT IS IT? An infection that lies within the keratin of your nail plate that is caused by a fungus.  WHY ME? Fungal infections affect all ages, sexes, races, and creeds.  There may be many factors that predispose you to a fungal infection such as age, coexisting medical conditions such as diabetes, or an autoimmune disease; stress, medications, fatigue, genetics, etc.  Bottom line: fungus thrives in a warm, moist environment and your shoes offer such a location.  IS IT CONTAGIOUS? Theoretically, yes.  You do not want to share shoes, nail clippers or files with someone who has fungal toenails.  Walking around barefoot in the same room or sleeping in the same bed is unlikely to transfer the organism.  It is important to realize, however, that fungus can spread easily from one nail to the next on the same foot.  HOW DO WE TREAT THIS?  There are several ways to treat this condition.  Treatment may depend on many factors such as age, medications, pregnancy, liver and kidney conditions, etc.  It is best to ask your doctor which options are available to you.  1. No treatment.   Unlike many other medical concerns, you can live with this condition.  However for many people this can be a painful condition and  may lead to ingrown toenails or a bacterial infection.  It is recommended that you keep the nails cut short to help reduce the amount of fungal nail. 2. Topical treatment.  These range from herbal remedies to prescription strength nail lacquers.  About 40-50% effective, topicals require twice daily application for approximately 9 to 12 months or until an entirely new nail has grown out.  The most effective topicals are medical grade medications available through physicians offices. 3. Oral antifungal medications.  With an 80-90% cure rate, the most common oral medication requires 3 to 4 months of therapy and stays in your system for a year as the new nail grows out.  Oral antifungal medications do require   blood work to make sure it is a safe drug for you.  A liver function panel will be performed prior to starting the medication and after the first month of treatment.  It is important to have the blood work performed to avoid any harmful side effects.  In general, this medication safe but blood work is required. 4. Laser Therapy.  This treatment is performed by applying a specialized laser to the affected nail plate.  This therapy is noninvasive, fast, and non-painful.  It is not covered by insurance and is therefore, out of pocket.  The results have been very good with a 80-95% cure rate.  The [facility] is the only practice in the area to offer this therapy. 5. Permanent Nail Avulsion.  Removing the entire nail so that a new nail will not grow back. 

## 2018-10-07 ENCOUNTER — Other Ambulatory Visit: Payer: Self-pay | Admitting: Internal Medicine

## 2018-10-07 MED FILL — hydrALAZINE HCL 50 MG TABS: 50 | 30 days supply | Qty: 180 | Fill #3

## 2018-10-07 MED FILL — FUROSEMIDE 40 MG TAB: 40 | 30 days supply | Qty: 30 | Fill #6

## 2018-10-07 MED FILL — BUPROPION HCL XL 300 MG TAB: 300 | 30 days supply | Qty: 30 | Fill #11

## 2018-10-07 MED FILL — LINZESS 290 MCG CAPSULE: 290 | 30 days supply | Qty: 30 | Fill #3

## 2018-10-07 MED FILL — AMITRIPTYLINE HCL 50 MG TAB: 50 | 30 days supply | Qty: 30 | Fill #2

## 2018-10-10 MED FILL — METOPROLOL TARTRATE 75 MG T: 75 | 30 days supply | Qty: 60 | Fill #0

## 2018-10-20 DIAGNOSIS — Z1231 Encounter for screening mammogram for malignant neoplasm of breast: Secondary | ICD-10-CM | POA: Diagnosis not present

## 2018-10-21 DIAGNOSIS — F331 Major depressive disorder, recurrent, moderate: Secondary | ICD-10-CM | POA: Diagnosis not present

## 2018-10-21 DIAGNOSIS — F329 Major depressive disorder, single episode, unspecified: Secondary | ICD-10-CM | POA: Diagnosis not present

## 2018-10-21 DIAGNOSIS — Z7189 Other specified counseling: Secondary | ICD-10-CM | POA: Diagnosis not present

## 2018-10-21 DIAGNOSIS — F54 Psychological and behavioral factors associated with disorders or diseases classified elsewhere: Secondary | ICD-10-CM | POA: Diagnosis not present

## 2018-10-25 ENCOUNTER — Other Ambulatory Visit: Payer: Self-pay | Admitting: Internal Medicine

## 2018-10-25 MED FILL — CELECOXIB 100 MG CAPSULE: 100 | 30 days supply | Qty: 60 | Fill #3

## 2018-10-25 MED FILL — RABEPRAZOLE SOD DR 20 MG TA: 20 | 30 days supply | Qty: 60 | Fill #5

## 2018-10-25 MED FILL — metFORMIN HCL 500 MG TABS: 500 | 30 days supply | Qty: 15 | Fill #2

## 2018-10-25 MED FILL — MONTELUKAST SOD 10 MG TAB: 10 | 30 days supply | Qty: 30 | Fill #3

## 2018-10-25 MED FILL — GABAPENTIN 300 MG CAPSULE: 300 | 30 days supply | Qty: 120 | Fill #0

## 2018-10-25 MED FILL — LEVOTHYROXINE 88 MCG TABLET: 88 | 30 days supply | Qty: 30 | Fill #2

## 2018-10-25 MED FILL — DULoxetine HCL 20 MG CPEP: 20 | 30 days supply | Qty: 30 | Fill #3

## 2018-10-28 ENCOUNTER — Encounter: Payer: Self-pay | Admitting: Gastroenterology

## 2018-10-28 ENCOUNTER — Other Ambulatory Visit: Payer: Self-pay

## 2018-10-28 ENCOUNTER — Encounter

## 2018-10-28 ENCOUNTER — Ambulatory Visit (INDEPENDENT_AMBULATORY_CARE_PROVIDER_SITE_OTHER): Payer: Medicaid Other | Admitting: Gastroenterology

## 2018-10-28 VITALS — BP 177/86 | HR 76 | Temp 97.2°F | Ht 68.0 in | Wt 334.0 lb

## 2018-10-28 DIAGNOSIS — R1010 Upper abdominal pain, unspecified: Secondary | ICD-10-CM | POA: Diagnosis not present

## 2018-10-28 DIAGNOSIS — K219 Gastro-esophageal reflux disease without esophagitis: Secondary | ICD-10-CM

## 2018-10-28 DIAGNOSIS — R198 Other specified symptoms and signs involving the digestive system and abdomen: Secondary | ICD-10-CM

## 2018-10-28 DIAGNOSIS — R1013 Epigastric pain: Secondary | ICD-10-CM

## 2018-10-28 DIAGNOSIS — G8929 Other chronic pain: Secondary | ICD-10-CM | POA: Diagnosis not present

## 2018-10-28 DIAGNOSIS — R1030 Lower abdominal pain, unspecified: Secondary | ICD-10-CM

## 2018-10-28 DIAGNOSIS — R109 Unspecified abdominal pain: Secondary | ICD-10-CM | POA: Insufficient documentation

## 2018-10-28 NOTE — Assessment & Plan Note (Addendum)
Complains of several month history of abdominal pain in the epigastrium as well as low abdomen, left greater than right.  Worse with meals.  She takes Celebrex for joint pain.  Remote colonoscopy with ileal ulcerations in the setting of NSAID use.  CT imaging a couple of years ago with small fat-containing umbilical hernia, mildly prominent mesenteric lymph nodes.  She has a subjective change in bowel habits.  Etiology of abdominal pain unclear, upper GI tract extensively evaluated/treated, noted to have some gastritis.  Given ongoing NSAID use, cannot exclude symptoms related mucosal abnormality throughout her GI tract.  Other etiologies include complicated diverticulitis, internal hernia, underlying malignancy, less likely biliary.  Recommend CT abdomen and pelvis with contrast to further evaluate ongoing abdominal pain.  If unremarkable, nexk step would be colonoscopy.

## 2018-10-28 NOTE — Progress Notes (Signed)
Primary Care Physician: Ladell Pier, MD  Primary Gastroenterologist:  Garfield Cornea, MD   Chief Complaint  Patient presents with  . Abdominal Pain    Has a full feeling, abd pain daily. Feels like everything is sitting at bottom of stomach after dinner  . Gastroesophageal Reflux    "still having issues" Does not feel aciphex is doing anything for her    HPI: Robin [name] is a 63 y.o. female here for follow-up.  She was last seen in July 2019 for nausea, GERD, fatty liver.  She had a normal gastric emptying study in 06/2017.  Esophagram in 11/2017 ordered by ENT showed trace reflux, small reducible hiatal hernia in the prone position.  Esophagus normal otherwise.  She had a EGD in April 2019 for ongoing upper GI symptoms, noted to have erosive gastropathy, chronic gastritis noted on biopsy but no H. pylori.  Normal-appearing esophagus status post empiric dilation.  Last colonoscopy November 2012 with abnormal terminal ileum for 10 cm, erosions, geographic ulcerations.  Small bowel biopsy with prominent intramucosal lymphoid aggregates, slightly inflamed.  Findings thought to be due to NSAIDs.  Previously failed omeprazole twice daily, ranitidine.  Pantoprazole twice daily helped initially but had recurrent symptoms eventually.  AcipHex twice daily initially helped but now with recurrent symptoms.  Cannot remember how Dexilant did for her.  CT abdomen pelvis with contrast April 2018 showed small fat-containing umbilical hernia.  Mildly prominent mesenteric lymph nodes largest measuring 1.1 cm.  Abdominal pain for 3 months. Epigastric burning all the time. Feels real full especially after dinner time. Feels it in lower abdomen mostly. Tried TUMS without relief. On aciphex bid. Usually within minutes after meal but then sometimes at bedtime. BMs,every morning soft stools. In evening hard stools. Still on Linzess 256mcg daily. New change in bowel habits. Having difficulty starting  urinary stream as well. No melena, brbpr. Chronic dysphagia evaluated last year via EGD and esophagram. Feels like food still sticks in throat. Still a lot of phlegm in the throat. Not yet signed up for gastric weight loss surgery but seeing psychologist.   Current Outpatient Medications  Medication Sig Dispense Refill  . ACCU-CHEK AVIVA PLUS test strip USE AS DIRECTED 100 each 12  . acetaminophen (TYLENOL) 500 MG tablet Take 1,000 mg by mouth 2 (two) times daily. Per bottle as needed     . albuterol (PROVENTIL HFA;VENTOLIN HFA) 108 (90 Base) MCG/ACT inhaler Inhale 2 puffs into the lungs every 6 (six) hours as needed for wheezing or shortness of breath. 1 Inhaler 2  . amitriptyline (ELAVIL) 50 MG tablet TAKE 1 TABLET BY MOUTH AT BEDTIME. 30 tablet 2  . benzonatate (TESSALON) 200 MG capsule Take 1 capsule (200 mg total) by mouth 3 (three) times daily as needed for cough. 45 capsule 2  . buPROPion (WELLBUTRIN XL) 300 MG 24 hr tablet Take 1 tablet (300 mg total) by mouth every morning. Reported on 12/09/2015 90 tablet 3  . celecoxib (CELEBREX) 100 MG capsule Take 100 mg by mouth 2 (two) times daily.    . chlorpheniramine (CHLOR-TRIMETON) 4 MG tablet 1 tab by mouth daily at bedtime, may have 1 every 4 hours as needed    . DULoxetine (CYMBALTA) 20 MG capsule Take 1 capsule (20 mg total) by mouth daily. 30 capsule 6  . ergocalciferol (VITAMIN D2) 1.25 MG (50000 UT) capsule ergocalciferol (vitamin D2) 1,250 mcg (50,000 unit) capsule    . fluticasone (FLONASE) 50 MCG/ACT nasal spray  Place 2 sprays into both nostrils daily. 16 g 6  . furosemide (LASIX) 40 MG tablet 1/2 to 1 tab daily for lower extremity swelling 30 tablet 6  . gabapentin (NEURONTIN) 300 MG capsule TAKE 1 CAPSULE (300 MG TOTAL) BY MOUTH 4 (FOUR) TIMES DAILY. 120 capsule 0  . glucose blood test strip Accu-Chek Aviva Plus test strips    . hydrALAZINE (APRESOLINE) 50 MG tablet Take 2 tablets (100 mg total) by mouth 3 (three) times daily. 180  tablet 5  . HYDROcodone-acetaminophen (NORCO/VICODIN) 5-325 MG tablet Take 1 tablet by mouth every 8 (eight) hours as needed for severe pain.     Marland Kitchen levothyroxine (SYNTHROID, LEVOTHROID) 88 MCG tablet TAKE 1 TABLET BY MOUTH DAILY. 30 tablet 2  . LINZESS 290 MCG CAPS capsule TAKE 1 CAPSULE BY MOUTH DAILY BEFORE BREAKFAST. 30 capsule 5  . losartan-hydrochlorothiazide (HYZAAR) 100-25 MG tablet losartan 100 mg-hydrochlorothiazide 25 mg tablet  TAKE 1 TABLET BY MOUTH ONCE DAILY.    . metFORMIN (GLUCOPHAGE) 500 MG tablet TAKE 1/2 TABLET DAILY WITH BREAKFAST BY MOUTH. 15 tablet 2  . Metoprolol Tartrate 75 MG TABS TAKE 1 TABLET BY MOUTH TWICE DAILY. 60 tablet 2  . montelukast (SINGULAIR) 10 MG tablet Take 1 tablet (10 mg total) by mouth at bedtime. 30 tablet 4  . Multiple Vitamin (MULTIVITAMIN) tablet Take 1 tablet by mouth daily. Reported on 12/09/2015 90 tablet 1  . PREMARIN vaginal cream   3  . RABEprazole (ACIPHEX) 20 MG tablet Take 1 tablet (20 mg total) by mouth 2 (two) times daily with a meal. 180 tablet 3  . sodium chloride (OCEAN) 0.65 % SOLN nasal spray Place 2 sprays into both nostrils every 4 (four) hours as needed for congestion.    Marland Kitchen spironolactone (ALDACTONE) 25 MG tablet Take 1 tablet (25 mg total) by mouth daily. 90 tablet 0  . vitamin B-12 (CYANOCOBALAMIN) 1000 MCG tablet Take 1,000 mcg by mouth daily.     No current facility-administered medications for this visit.     Allergies as of 10/28/2018 - Review Complete 10/28/2018  Allergen Reaction Noted  . Celexa [citalopram hydrobromide] Other (See Comments) 05/28/2017  . Gabapentin  05/03/2018  . Norvasc [amlodipine besylate]  05/03/2018  . Diflucan [fluconazole] Rash 02/08/2018   Past Medical History:  Diagnosis Date  . Anxiety disorder   . Arthritis   . Asthma   . Chronic neck pain   . Constipation   . Depression   . Diabetes mellitus without complication ([place])   . GERD (gastroesophageal reflux disease)   . Hiatal hernia      small  . Hyperlipidemia   . Hypertension   . Hypothyroidism   . Schatzki's ring    non critical  . Sleep apnea    CPAP, Sleep study at [place]  . Sleep apnea    Past Surgical History:  Procedure Laterality Date  . ABDOMINAL HYSTERECTOMY    . ABDOMINAL HYSTERECTOMY  02/18/2000  . CARDIAC CATHETERIZATION  08/18/2003   normal L main/LAD/L Cfx/RCA (Dr. Adora Fridge)  . COLONOSCOPY  2006   Dr. [name], hyperplastic polyps  . COLONOSCOPY  08/06/11   abnormal terminal ileum for 10cm, erosions, geographical ulceration. Bx small bowel mucosa with prominent intramucosal lymphoid aggregates, slightly inflammed  . DIRECT LARYNGOSCOPY  03/15/2012   Procedure: DIRECT LARYNGOSCOPY;  Surgeon: [name], MD;  Location: [facility];  Service: ENT;  Laterality: N/A; Dr. Wolicki--:>no foreign body seen. normal esophagus to 40cm  .  ESOPHAGEAL DILATION  04/17/2015   Procedure: ESOPHAGEAL DILATION;  Surgeon: [name], MD;  [facility];  Service: Endoscopy;;  . ESOPHAGOGASTRODUODENOSCOPY  08/23/2008   OHY:WVPX distal esophageal erosions consistent with mild erosive reflux esophagitis, otherwise unremarkable esophagus/ Tiny antral erosions of doubtful clinical significance, otherwise normal stomach, patent pylorus, normal D1 and D2  . ESOPHAGOGASTRODUODENOSCOPY  08/06/11   small hh, noncritical Schatzki's ring s/p 29 F  . ESOPHAGOGASTRODUODENOSCOPY N/A 04/17/2015   TGG:YIRSWN s/p dilation  . ESOPHAGOGASTRODUODENOSCOPY (EGD) WITH PROPOFOL N/A 01/20/2018   Dr. [name]: Erosive gastropathy, normal-appearing esophagus status post empiric dilation.  Chronic gastritis, no H. pylori.  . ESOPHAGOSCOPY  03/15/2012   Procedure: ESOPHAGOSCOPY;  Surgeon: [name], MD;  Location: [facility];  Service: ENT;  Laterality: N/A;  . KNEE ARTHROSCOPY  04/27/2001  . MALONEY DILATION N/A 01/20/2018   Procedure: Venia Minks DILATION;  Surgeon: [name], MD;  [facility];  Service:  Endoscopy;  Laterality: N/A;  . [place]  2003   negative bruce protocol exercise tress test; EF 68%; intermediate risk study due to evidence of anterior wall ischemia extending from mid-ventricle to apex  . 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 RECTOCELE REPAIR    . TRANSTHORACIC ECHOCARDIOGRAM  2003   EF normal  . VAGINAL PROLAPSE REPAIR    . VIDEO BRONCHOSCOPY Bilateral 03/08/2017   Procedure: VIDEO BRONCHOSCOPY WITHOUT FLUORO;  Surgeon: Javier Glazier, MD;  Location: [facility];  Service: Cardiopulmonary;  Laterality: Bilateral;    ROS:  General: Negative for anorexia, weight loss, fever, chills, fatigue, weakness. ENT: Negative for hoarseness, difficulty swallowing , nasal congestion. CV: Negative for chest pain, angina, palpitations, dyspnea on exertion, peripheral edema.  Respiratory: Negative for dyspnea at rest, dyspnea on exertion, cough, sputum, wheezing.  GI: See history of present illness. GU:  Negative for dysuria, hematuria, urinary incontinence, urinary frequency, nocturnal urination.  Endo: Negative for unusual weight change.    Physical Examination:   BP (!) 177/86   Pulse 76   Temp (!) 97.2 F (36.2 C) (Oral)   Ht 5\' 8"  (1.727 m)   Wt (!) 334 lb (151.5 kg)   BMI 50.78 kg/m   General: Well-nourished, well-developed in no acute distress.  Eyes: No icterus. Mouth: Oropharyngeal mucosa moist and pink , no lesions erythema or exudate. Lungs: Clear to auscultation bilaterally.  Heart: Regular rate and rhythm, no murmurs rubs or gallops.  Abdomen: Bowel sounds are normal, epigastric tenderness, diffuse low abd tenderness but LLQ greater than RLQ.  nondistended, no hepatosplenomegaly or masses, no abdominal bruits, no rebound or guarding.  Small easily reducible umbilical hernia. Extremities: No lower extremity edema. No clubbing or deformities. Neuro: Alert and oriented x 4   Skin: Warm and dry, no jaundice.     Psych: Alert and cooperative, normal mood and affect.  Labs:  Lab Results  Component Value Date   CREATININE 0.82 09/08/2018   BUN 10 09/08/2018   NA 139 09/08/2018   K 4.4 09/08/2018   CL 100 09/08/2018   CO2 25 09/08/2018   Lab Results  Component Value Date   WBC 6.8 03/08/2018   HGB 11.7 (L) 03/08/2018   HCT 36.9 03/08/2018   MCV 87.4 03/08/2018   PLT 362.0 03/08/2018   Lab Results  Component Value Date   ALT 20 03/04/2018   AST 18 03/04/2018   ALKPHOS 81 03/04/2018   BILITOT 0.3 03/04/2018  Imaging Studies: No results found.

## 2018-10-28 NOTE — Patient Instructions (Addendum)
Please have your labs and CT done. We will contact you with results as available.   Stop Aciphex (rabeprazole). Start Dexilant once daily before breakfast. Samples provided. If they help, please call and we will send in RX.   If CT is unremarkable, next step would be colonoscopy.

## 2018-10-28 NOTE — Assessment & Plan Note (Signed)
Typical heartburn well controlled on Aciphex 20mg  BID but continues to complain of postprandial fullness/pain, burning in both upper and lower abdomen. CT as planned. Change aciphex to Dexilant 60mg  daily before breakfast. Samples provided. Call for RX if helpful.

## 2018-10-29 ENCOUNTER — Encounter: Payer: Self-pay | Admitting: Podiatry

## 2018-10-29 NOTE — Progress Notes (Signed)
Subjective: Robin [name] presents today with history of neuropathy with cc of painful, mycotic toenails.  Pain is aggravated when wearing enclosed shoe gear and relieved with periodic professional debridement.  She voices no new problems on today's visit.  Ladell Pier, MD is her primary care physician and last visit was September 08, 2018    Allergies  Allergen Reactions  . Celexa [Citalopram Hydrobromide] Other (See Comments)    "Made me feel out of it"  . Gabapentin     Contributed to lower extremity edema  . Norvasc [Amlodipine Besylate]     Edema in lower extremities  . Diflucan [Fluconazole] Rash    Objective:  Vascular Examination: Capillary refill time immediate x 10 digits Dorsalis pedis and Posterior tibial pulses palpable bilaterally Digital hair x 10 digits was absent Skin temperature gradient WNL b/l  Dermatological Examination: Skin with normal turgor, texture and tone b/l  Toenails 1-5 b/l discolored, thick, dystrophic with subungual debris and pain with palpation to nailbeds due to thickness of nails.  Hyperkeratotic lesion noted submetatarsal head 5 bilaterally.  There is no flocculence no pain no erythema no edema noted.  Musculoskeletal: Muscle strength 5/5 to all muscle groups b/l  Neurological: Sensation with 10 gram monofilament is absent b/l Vibratory sensation absent b/l  Assessment: 1. Painful onychomycosis toenails 1-5 b/l 2. Callus submetatarsal head 5 bilaterally 3. NIDDM with neuropathy  Plan: 1. Toenails 1-5 b/l were debrided in length and girth without iatrogenic bleeding. 2. Calluses were pared submetatarsal head 5 bilaterally without complication. 3. Patient to continue soft, supportive shoe gear 4. Patient to report any pedal injuries to medical professional  5. Follow up 3 months. Patient/POA to call should there be a concern in the interim.

## 2018-10-31 ENCOUNTER — Telehealth: Payer: Self-pay

## 2018-10-31 DIAGNOSIS — M539 Dorsopathy, unspecified: Secondary | ICD-10-CM | POA: Insufficient documentation

## 2018-10-31 DIAGNOSIS — M150 Primary generalized (osteo)arthritis: Secondary | ICD-10-CM | POA: Diagnosis not present

## 2018-10-31 DIAGNOSIS — M791 Myalgia, unspecified site: Secondary | ICD-10-CM | POA: Diagnosis not present

## 2018-10-31 NOTE — Progress Notes (Signed)
CC'D TO PCP °

## 2018-10-31 NOTE — Telephone Encounter (Signed)
Pt requested CT abd/pelvis w/contrast be done at [facility].  PA for CT submitted via [employer] website. Case pending. Service order: [id number]. Clinical notes faxed to [facility].

## 2018-11-02 DIAGNOSIS — M79672 Pain in left foot: Secondary | ICD-10-CM | POA: Diagnosis not present

## 2018-11-02 DIAGNOSIS — M25561 Pain in right knee: Secondary | ICD-10-CM | POA: Diagnosis not present

## 2018-11-02 DIAGNOSIS — G894 Chronic pain syndrome: Secondary | ICD-10-CM | POA: Diagnosis not present

## 2018-11-02 DIAGNOSIS — M25562 Pain in left knee: Secondary | ICD-10-CM | POA: Diagnosis not present

## 2018-11-02 NOTE — Telephone Encounter (Signed)
Received fax from [place], [place] approved. PA# [id number], 10/31/18-11/30/18. Called [facility] [employer] and spoke to [place]. Informed her of CT order and PA. GI will call pt to schedule appt.

## 2018-11-04 ENCOUNTER — Other Ambulatory Visit: Payer: Medicaid Other

## 2018-11-09 ENCOUNTER — Other Ambulatory Visit: Payer: Self-pay | Admitting: Internal Medicine

## 2018-11-09 MED FILL — METOPROLOL TARTRATE 75 MG T: 75 | 30 days supply | Qty: 60 | Fill #1

## 2018-11-11 ENCOUNTER — Ambulatory Visit
Admission: RE | Admit: 2018-11-11 | Discharge: 2018-11-11 | Disposition: A | Discharge status: Home / Self Care | Payer: Medicaid Other | Source: Ambulatory Visit | Attending: Gastroenterology | Admitting: Gastroenterology

## 2018-11-11 ENCOUNTER — Other Ambulatory Visit: Payer: Self-pay | Admitting: Internal Medicine

## 2018-11-11 DIAGNOSIS — R1013 Epigastric pain: Secondary | ICD-10-CM | POA: Diagnosis not present

## 2018-11-11 DIAGNOSIS — I10 Essential (primary) hypertension: Secondary | ICD-10-CM

## 2018-11-11 DIAGNOSIS — R1010 Upper abdominal pain, unspecified: Secondary | ICD-10-CM

## 2018-11-11 MED ORDER — IOPAMIDOL (ISOVUE-300) INJECTION 61%
125.0000 mL | Freq: Once | INTRAVENOUS | Status: AC | PRN
  Administered 2018-11-11: 125 mL via INTRAVENOUS

## 2018-11-11 MED FILL — AMITRIPTYLINE HCL 50 MG TAB: 50 | 30 days supply | Qty: 30 | Fill #0

## 2018-11-14 ENCOUNTER — Telehealth: Payer: Self-pay | Admitting: Internal Medicine

## 2018-11-14 MED FILL — BUPROPION HCL XL 300 MG TAB: 300 | 30 days supply | Qty: 30 | Fill #0

## 2018-11-14 NOTE — Telephone Encounter (Signed)
Patient came in and dropped a letter off for you to look at and approve.

## 2018-11-15 NOTE — Telephone Encounter (Signed)
Patient called to check on the status of her paperwork because she would like to pick it up by lunch time Please follow up.

## 2018-11-15 NOTE — Telephone Encounter (Signed)
Please let pt know it takes 7-10 business days for paperwork. Dr. Wynetta Emery does paperwork on her admin days which is Wednesday

## 2018-11-18 DIAGNOSIS — F54 Psychological and behavioral factors associated with disorders or diseases classified elsewhere: Secondary | ICD-10-CM | POA: Diagnosis not present

## 2018-11-18 DIAGNOSIS — F331 Major depressive disorder, recurrent, moderate: Secondary | ICD-10-CM | POA: Diagnosis not present

## 2018-11-18 NOTE — Telephone Encounter (Signed)
Contacted and made pt aware that paperwork is ready for pick up and we close today at 1230 pm if unable to pick up today can pick up Monday

## 2018-11-21 ENCOUNTER — Other Ambulatory Visit: Payer: Self-pay | Admitting: Internal Medicine

## 2018-11-21 DIAGNOSIS — I10 Essential (primary) hypertension: Secondary | ICD-10-CM

## 2018-11-21 MED FILL — LINZESS 290 MCG CAPSULE: 290 | 30 days supply | Qty: 30 | Fill #4

## 2018-11-22 ENCOUNTER — Other Ambulatory Visit: Payer: Self-pay

## 2018-11-22 DIAGNOSIS — R194 Change in bowel habit: Secondary | ICD-10-CM

## 2018-11-22 DIAGNOSIS — R1030 Lower abdominal pain, unspecified: Secondary | ICD-10-CM

## 2018-11-22 MED ORDER — PEG 3350-KCL-NA BICARB-NACL 420 G PO SOLR: 4000.0000 mL | ORAL | 0 refills | Status: DC

## 2018-11-23 MED FILL — FUROSEMIDE 40 MG TAB: 40 | 30 days supply | Qty: 30 | Fill #0

## 2018-11-23 MED FILL — PEG-3350 SOLUTION: 420 | 1 days supply | Qty: 4000 | Fill #0

## 2018-11-23 NOTE — Progress Notes (Signed)
Called pt yesterday, TCS w/Propofol w/RMR scheduled for 01/23/19 at 9:00am. Orders entered. Rx for prep sent to pharmacy.

## 2018-11-24 ENCOUNTER — Other Ambulatory Visit: Payer: Self-pay | Admitting: Internal Medicine

## 2018-11-24 MED FILL — GABAPENTIN 300 MG CAPSULE: 300 | 30 days supply | Qty: 120 | Fill #0

## 2018-11-24 MED FILL — MONTELUKAST SOD 10 MG TAB: 10 | 30 days supply | Qty: 30 | Fill #4

## 2018-11-25 ENCOUNTER — Other Ambulatory Visit: Payer: Self-pay | Admitting: Gastroenterology

## 2018-11-25 DIAGNOSIS — M545 Low back pain: Secondary | ICD-10-CM | POA: Diagnosis not present

## 2018-11-25 MED ORDER — DEXLANSOPRAZOLE 60 MG PO CPDR: 60.0000 mg | DELAYED_RELEASE_CAPSULE | Freq: Every day | ORAL | 3 refills | Status: DC

## 2018-11-28 ENCOUNTER — Other Ambulatory Visit: Payer: Self-pay | Admitting: Internal Medicine

## 2018-11-28 MED FILL — metFORMIN HCL 500 MG TABS: 500 | 30 days supply | Qty: 15 | Fill #0

## 2018-11-28 MED FILL — PROVENTIL HFA 108 (90 BASE): 108 (90 BAS | 25 days supply | Qty: 7 | Fill #0

## 2018-11-28 MED FILL — DEXILANT DR 60 MG CAPSULE: 60 | 30 days supply | Qty: 30 | Fill #0

## 2018-11-28 MED FILL — DULoxetine HCL 20 MG CPEP: 20 | 30 days supply | Qty: 30 | Fill #4

## 2018-11-28 MED FILL — CELECOXIB 100 MG CAPSULE: 100 | 30 days supply | Qty: 60 | Fill #0

## 2018-12-06 ENCOUNTER — Other Ambulatory Visit: Payer: Self-pay | Admitting: Cardiovascular Disease

## 2018-12-06 ENCOUNTER — Other Ambulatory Visit: Payer: Self-pay | Admitting: Internal Medicine

## 2018-12-06 DIAGNOSIS — Z713 Dietary counseling and surveillance: Secondary | ICD-10-CM | POA: Diagnosis not present

## 2018-12-06 DIAGNOSIS — Z724 Inappropriate diet and eating habits: Secondary | ICD-10-CM | POA: Diagnosis not present

## 2018-12-06 DIAGNOSIS — Z6841 Body Mass Index (BMI) 40.0 and over, adult: Secondary | ICD-10-CM | POA: Diagnosis not present

## 2018-12-06 DIAGNOSIS — Z7289 Other problems related to lifestyle: Secondary | ICD-10-CM | POA: Diagnosis not present

## 2018-12-06 MED FILL — LEVOTHYROXINE 88 MCG TABLET: 88 | 30 days supply | Qty: 30 | Fill #0

## 2018-12-06 MED FILL — AMLODIPINE BESYLATE 5 MG TA: 5 | 90 days supply | Qty: 90 | Fill #1

## 2018-12-09 ENCOUNTER — Ambulatory Visit: Payer: Medicaid Other | Attending: Internal Medicine | Admitting: Internal Medicine

## 2018-12-09 ENCOUNTER — Encounter: Payer: Self-pay | Admitting: Internal Medicine

## 2018-12-09 VITALS — BP 124/79 | HR 58 | Temp 97.8°F | Resp 16 | Wt 331.4 lb

## 2018-12-09 DIAGNOSIS — E063 Autoimmune thyroiditis: Secondary | ICD-10-CM | POA: Diagnosis not present

## 2018-12-09 DIAGNOSIS — M797 Fibromyalgia: Secondary | ICD-10-CM | POA: Diagnosis not present

## 2018-12-09 DIAGNOSIS — Z87891 Personal history of nicotine dependence: Secondary | ICD-10-CM | POA: Diagnosis not present

## 2018-12-09 DIAGNOSIS — Z6841 Body Mass Index (BMI) 40.0 and over, adult: Secondary | ICD-10-CM | POA: Diagnosis not present

## 2018-12-09 DIAGNOSIS — Z8249 Family history of ischemic heart disease and other diseases of the circulatory system: Secondary | ICD-10-CM | POA: Insufficient documentation

## 2018-12-09 DIAGNOSIS — R1310 Dysphagia, unspecified: Secondary | ICD-10-CM | POA: Insufficient documentation

## 2018-12-09 DIAGNOSIS — K760 Fatty (change of) liver, not elsewhere classified: Secondary | ICD-10-CM | POA: Diagnosis not present

## 2018-12-09 DIAGNOSIS — R7303 Prediabetes: Secondary | ICD-10-CM | POA: Diagnosis not present

## 2018-12-09 DIAGNOSIS — E039 Hypothyroidism, unspecified: Secondary | ICD-10-CM | POA: Insufficient documentation

## 2018-12-09 DIAGNOSIS — Z79899 Other long term (current) drug therapy: Secondary | ICD-10-CM | POA: Insufficient documentation

## 2018-12-09 DIAGNOSIS — Z713 Dietary counseling and surveillance: Secondary | ICD-10-CM | POA: Diagnosis not present

## 2018-12-09 DIAGNOSIS — Z7901 Long term (current) use of anticoagulants: Secondary | ICD-10-CM | POA: Diagnosis not present

## 2018-12-09 DIAGNOSIS — E785 Hyperlipidemia, unspecified: Secondary | ICD-10-CM | POA: Insufficient documentation

## 2018-12-09 DIAGNOSIS — Z7984 Long term (current) use of oral hypoglycemic drugs: Secondary | ICD-10-CM | POA: Diagnosis not present

## 2018-12-09 DIAGNOSIS — D509 Iron deficiency anemia, unspecified: Secondary | ICD-10-CM | POA: Insufficient documentation

## 2018-12-09 DIAGNOSIS — F419 Anxiety disorder, unspecified: Secondary | ICD-10-CM | POA: Diagnosis not present

## 2018-12-09 DIAGNOSIS — I10 Essential (primary) hypertension: Secondary | ICD-10-CM | POA: Diagnosis not present

## 2018-12-09 DIAGNOSIS — F341 Dysthymic disorder: Secondary | ICD-10-CM

## 2018-12-09 DIAGNOSIS — M170 Bilateral primary osteoarthritis of knee: Secondary | ICD-10-CM

## 2018-12-09 DIAGNOSIS — D352 Benign neoplasm of pituitary gland: Secondary | ICD-10-CM | POA: Diagnosis not present

## 2018-12-09 LAB — GLUCOSE, POCT (MANUAL RESULT ENTRY): POC Glucose: 95 mg/dl (ref 70–99)

## 2018-12-09 MED ORDER — DULOXETINE HCL 30 MG PO CPEP: 30.0000 mg | ORAL_CAPSULE | Freq: Every day | ORAL | 6 refills | Status: DC

## 2018-12-09 MED FILL — DULoxetine HCL 30 MG CPEP: 30 | 30 days supply | Qty: 30 | Fill #0

## 2018-12-09 NOTE — Progress Notes (Signed)
Patient ID: Robin [name], female    DOB: 03-09-1956  MRN: [id number]  CC: Hypertension and Diabetes (prediabetes)   Subjective: Robin [name] is a 63 y.o. female who presents for chronic ds management. Her concerns today include:  Patient with history ofHTN, pre-DM,OSA, fibromyalgia, depression, obesity, GERD, hypothyroid, OA of multiple js (hands, knees, shoulders)   HTN:  Checking BP at home once a day.  Gives range 116-140/60-70 Limiting salt Some LE edema that goes and come  Obesity/Pre-DM:  Has appt with Dr. Deon Pilling 12/23/2018.  Still wanting to have wgh loss surgery.  She has figured out her co-pay -eating habits:  "some days are pretty good and some days their not."  Depression:  Increase some due to husband's health and marital issues.  Still seeing counselor Q 2 wks.  Feels she needs to be on higher dose of antidepressant.  No suicidal ideation  OA:  Would like to get a walker because sometimes knees buckle.  No falls.  She tries to walk a little every day for exercise.  Hx of Pituitary Adenoma:  Had excision at [facility] about 6 yrs ago.  Last MRI was back in 2016.  Patient states she was told that she needs MRI for follow-up intermittently. Symptoms prior to surgery included dec vision RT eye and bad HA.  Some HA lately which she feels is partially due to stress. Patient Active Problem List   Diagnosis Date Noted  . Lower abdominal pain 10/28/2018  . Change in bowel function 10/28/2018  . Gastritis and gastroduodenitis 04/26/2018  . Atrophic vaginitis 04/01/2018  . Diabetes mellitus ([place]) 03/31/2018  . Upper airway cough syndrome 02/08/2018  . Dysphagia 12/14/2017  . 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 Urinary incontinence 07/23/2017  . Early satiety 06/16/2017  . Hx of iron deficiency anemia 05/28/2017  . Throat pain in adult 04/05/2017  . Prediabetes 02/04/2017  . Dyspnea on exertion 12/23/2016  . Chronic allergic rhinitis 12/23/2016  . Hemoptysis 12/23/2016  .  Fibromyalgia 08/18/2016  . Chronic lymphocytic thyroiditis 08/18/2016  . Depression 04/01/2016  . OSA (obstructive sleep apnea) 04/01/2016  . Essential hypertension 12/09/2015  . Hypothyroidism 12/09/2015  . Morbid obesity due to excess calories ([place]) 12/09/2015  . Constipation 05/27/2015  . Fatty liver 05/27/2015  . Abdominal pain, chronic, epigastric 07/04/2012  . Anxiety 06/09/2012  . History of gastroesophageal reflux (GERD) 06/09/2012  . Hyperlipidemia 06/09/2012  . Refusal of blood transfusions as patient is Jehovah's Witness 06/09/2012  . 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 Abnormal small bowel biopsy 09/18/2011  . Lactose intolerance 09/18/2011  . GERD 01/21/2010     Current Outpatient Medications on File Prior to Visit  Medication Sig Dispense Refill  . ACCU-CHEK AVIVA PLUS test strip USE AS DIRECTED 100 each 12  . acetaminophen (TYLENOL) 500 MG tablet Take 1,000 mg by mouth 2 (two) times daily. Per bottle as needed     . amitriptyline (ELAVIL) 50 MG tablet TAKE 1 TABLET BY MOUTH AT BEDTIME. 30 tablet 2  . benzonatate (TESSALON) 200 MG capsule Take 1 capsule (200 mg total) by mouth 3 (three) times daily as needed for cough. 45 capsule 2  . buPROPion (WELLBUTRIN XL) 300 MG 24 hr tablet TAKE 1 TABLET (300 MG TOTAL) BY MOUTH EVERY MORNING. 90 tablet 0  . celecoxib (CELEBREX) 100 MG capsule Take 100 mg by mouth 2 (two) times daily.    . chlorpheniramine (CHLOR-TRIMETON) 4 MG tablet 1 tab by mouth daily at bedtime, may  have 1 every 4 hours as needed    . dexlansoprazole (DEXILANT) 60 MG capsule Take 1 capsule (60 mg total) by mouth daily. 30 capsule 3  . ergocalciferol (VITAMIN D2) 1.25 MG (50000 UT) capsule ergocalciferol (vitamin D2) 1,250 mcg (50,000 unit) capsule    . fluticasone (FLONASE) 50 MCG/ACT nasal spray Place 2 sprays into both nostrils daily. 16 g 6  . furosemide (LASIX) 40 MG tablet TAKE 1/2 TO 1 TABLET BY MOUTH DAILY FOR LOWER EXTREMITY SWELLING 30 tablet 0   . gabapentin (NEURONTIN) 300 MG capsule TAKE 1 CAPSULE (300 MG TOTAL) BY MOUTH 4 (FOUR) TIMES DAILY. 120 capsule 0  . glucose blood test strip Accu-Chek Aviva Plus test strips    . hydrALAZINE (APRESOLINE) 50 MG tablet Take 2 tablets (100 mg total) by mouth 3 (three) times daily. 180 tablet 5  . HYDROcodone-acetaminophen (NORCO/VICODIN) 5-325 MG tablet Take 1 tablet by mouth every 8 (eight) hours as needed for severe pain.     Marland Kitchen levothyroxine (SYNTHROID, LEVOTHROID) 88 MCG tablet TAKE 1 TABLET BY MOUTH DAILY. 30 tablet 2  . LINZESS 290 MCG CAPS capsule TAKE 1 CAPSULE BY MOUTH DAILY BEFORE BREAKFAST. 30 capsule 5  . losartan-hydrochlorothiazide (HYZAAR) 100-25 MG tablet losartan 100 mg-hydrochlorothiazide 25 mg tablet  TAKE 1 TABLET BY MOUTH ONCE DAILY.    . metFORMIN (GLUCOPHAGE) 500 MG tablet TAKE 1/2 TABLET DAILY WITH BREAKFAST 15 tablet 2  . Metoprolol Tartrate 75 MG TABS TAKE 1 TABLET BY MOUTH TWICE DAILY. 60 tablet 2  . montelukast (SINGULAIR) 10 MG tablet Take 1 tablet (10 mg total) by mouth at bedtime. 30 tablet 4  . Multiple Vitamin (MULTIVITAMIN) tablet Take 1 tablet by mouth daily. Reported on 12/09/2015 90 tablet 1  . polyethylene glycol-electrolytes (TRILYTE) 420 g solution Take 4,000 mLs by mouth as directed. 4000 mL 0  . PREMARIN vaginal cream   3  . PROVENTIL HFA 108 (90 Base) MCG/ACT inhaler INHALE 2 PUFFS INTO THE LUNGS EVERY 6 HOURS AS NEEDED FOR WHEEZING OR SHORTNESS OF BREATH. 6.7 g 2  . RABEprazole (ACIPHEX) 20 MG tablet Take 1 tablet (20 mg total) by mouth 2 (two) times daily with a meal. 180 tablet 3  . sodium chloride (OCEAN) 0.65 % SOLN nasal spray Place 2 sprays into both nostrils every 4 (four) hours as needed for congestion.    Marland Kitchen spironolactone (ALDACTONE) 25 MG tablet TAKE 1 TABLET (25 MG TOTAL) BY MOUTH DAILY. 30 tablet 2  . vitamin B-12 (CYANOCOBALAMIN) 1000 MCG tablet Take 1,000 mcg by mouth daily.     No current facility-administered medications on file prior to  visit.     Allergies  Allergen Reactions  . Celexa [Citalopram Hydrobromide] Other (See Comments)    "Made me feel out of it"  . Gabapentin     Contributed to lower extremity edema  . Norvasc [Amlodipine Besylate]     Edema in lower extremities  . Diflucan [Fluconazole] Rash    Social History   Socioeconomic History  . Marital status: Married    Spouse name: Not on file  . Number of children: 4  . Years of education: 10  . Highest education level: Not on file  Occupational History    Employer: UNEMPLOYED  Social Needs  . Financial resource strain: Not on file  . Food insecurity:    Worry: Not on file    Inability: Not on file  . Transportation needs:    Medical: Not on file  Non-medical: Not on file  Tobacco Use  . Smoking status: Former Smoker    Packs/day: 0.50    Years: 10.00    Pack years: 5.00    Types: Cigarettes    Start date: 07/14/1975    Last attempt to quit: 04/04/1993    Years since quitting: 25.6  . Smokeless tobacco: Never Used  . Tobacco comment: 17 yrs ago  Substance and Sexual Activity  . Alcohol use: No    Alcohol/week: 0.0 standard drinks  . Drug use: No  . Sexual activity: Not on file  Lifestyle  . Physical activity:    Days per week: Not on file    Minutes per session: Not on file  . Stress: Not on file  Relationships  . Social connections:    Talks on phone: Not on file    Gets together: Not on file    Attends religious service: Not on file    Active member of club or organization: Not on file    Attends meetings of clubs or organizations: Not on file    Relationship status: Not on file  . Intimate partner violence:    Fear of current or ex partner: Not on file    Emotionally abused: Not on file    Physically abused: Not on file    Forced sexual activity: Not on file  Other Topics Concern  . Not on file  Social History Narrative   Lives with husband in an apartment on the first floor.  Has 4 children.     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  Trying to get disability.  Formerly worked as a Teacher, early years/pre.  Education: 11th grade.      [facility] (12/23/16):   Originally from [place] [facility]. She was raised in [place]. 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 No pets currently. No bird exposure. She does have mold in her current home in the bathroom, laundry room, & master bedroom.     Family History  Problem Relation Age of Onset  . Kidney cancer Father   . Hypertension Father   . Hyperlipidemia Father   . Sleep apnea Father   . Diabetes Mother   . Hypertension Mother   . Heart Problems Mother   . Hyperlipidemia Mother   . Asthma Mother   . Sleep apnea Mother   . Liver disease Brother   . Arthritis Brother   . Hypertension Sister   . Allergic rhinitis Sister   . Stomach cancer Other        aunt  . Breast cancer Maternal Grandmother   . Kidney disease Maternal Grandfather   . Breast cancer Paternal Grandmother   . Hypertension Brother   . Hyperlipidemia Brother   . Hypertension Brother   . Hypertension Sister   . Hyperlipidemia Sister   . Lupus Maternal Aunt   . Colon cancer Neg Hx   . Eczema Neg Hx   . Immunodeficiency Neg Hx   . Urticaria Neg Hx     Past Surgical History:  Procedure Laterality Date  . ABDOMINAL HYSTERECTOMY    . ABDOMINAL HYSTERECTOMY  02/18/2000  . CARDIAC CATHETERIZATION  08/18/2003   normal L main/LAD/L Cfx/RCA (Dr. Adora Fridge)  . COLONOSCOPY  2006   Dr. [name], hyperplastic polyps  . COLONOSCOPY  08/06/11   abnormal terminal ileum for 10cm, erosions, geographical ulceration. Bx small bowel mucosa with prominent intramucosal lymphoid aggregates, slightly inflammed  . DIRECT LARYNGOSCOPY  03/15/2012   Procedure: DIRECT LARYNGOSCOPY;  Surgeon: [name], MD;  Location: [facility];  Service: ENT;  Laterality: N/A; Dr.  Wolicki--:>no foreign body seen. normal esophagus to 40cm  . ESOPHAGEAL DILATION  04/17/2015   Procedure: ESOPHAGEAL DILATION;  Surgeon: [name], MD;  [facility];  Service: Endoscopy;;  . ESOPHAGOGASTRODUODENOSCOPY  08/23/2008   OQH:UTML distal esophageal erosions consistent with mild erosive reflux esophagitis, otherwise unremarkable esophagus/ Tiny antral erosions of doubtful clinical significance, otherwise normal stomach, patent pylorus, normal D1 and D2  . ESOPHAGOGASTRODUODENOSCOPY  08/06/11   small hh, noncritical Schatzki's ring s/p 52 F  . ESOPHAGOGASTRODUODENOSCOPY N/A 04/17/2015   [id number] s/p dilation  . ESOPHAGOGASTRODUODENOSCOPY (EGD) WITH PROPOFOL N/A 01/20/2018   Dr. [name]: Erosive gastropathy, normal-appearing esophagus status post empiric dilation.  Chronic gastritis, no H. pylori.  . ESOPHAGOSCOPY  03/15/2012   Procedure: ESOPHAGOSCOPY;  Surgeon: [name], MD;  Location: [place];  Service: ENT;  Laterality: N/A;  . KNEE ARTHROSCOPY  04/27/2001  . MALONEY DILATION N/A 01/20/2018   Procedure: Venia Minks DILATION;  Surgeon: [name], MD;  [facility];  Service: Endoscopy;  Laterality: N/A;  . [place]  2003   negative bruce protocol exercise tress test; EF 68%; intermediate risk study due to evidence of anterior wall ischemia extending from mid-ventricle to apex  . PITUITARY SURGERY  06/2012   benign tumor, surgeon at [facility]  . RECTOCELE REPAIR    . TRANSTHORACIC ECHOCARDIOGRAM  2003   EF normal  . VAGINAL PROLAPSE REPAIR    . VIDEO BRONCHOSCOPY Bilateral 03/08/2017   Procedure: VIDEO BRONCHOSCOPY WITHOUT FLUORO;  Surgeon: Javier Glazier, MD;  Location: [place];  Service: Cardiopulmonary;  Laterality: Bilateral;    ROS: Review of Systems  HENT: Positive for ear pain and sore throat.    Negative except as stated above  PHYSICAL EXAM: BP 124/79   Pulse (!) 58   Temp 97.8 F (36.6 C) (Oral)   Resp 16   Wt  (!) 331 lb 6.4 oz (150.3 kg)   SpO2 99%   BMI 50.39 kg/m   Wt Readings from Last 3 Encounters:  12/09/18 (!) 331 lb 6.4 oz (150.3 kg)  10/28/18 (!) 334 lb (151.5 kg)  09/08/18 (!) 335 lb 3.2 oz (152 kg)   Physical Exam  General appearance - alert, well appearing, and in no distress Mental status - normal mood, behavior, speech, dress, motor activity, and thought processes Mouth - mucous membranes moist, pharynx normal without lesions  Ears -both tympanic membranes and ear canals are within normal limits Neck - supple, no significant adenopathy Chest - clear to auscultation, no wheezes, rales or rhonchi, symmetric air entry Heart - normal rate, regular rhythm, normal S1, S2, no murmurs, rubs, clicks or gallops Musculoskeletal -mild enlargement of both knee joints.  Patient ambulating with a cane Extremities -no lower extremity edema at this time  Results for orders placed or performed in visit on 12/09/18  POCT glucose (manual entry)  Result Value Ref Range   POC Glucose 95 70 - 99 mg/dl   Lab Results  Component Value Date   HGBA1C 5.4 07/11/2018   Depression screen PHQ 2/9 09/08/2018 07/11/2018 06/09/2018  Decreased Interest 3 2 2   Down, Depressed, Hopeless 2 3 -  PHQ - 2 Score  5 5 2   Altered sleeping 1 2 2   Tired, decreased energy 3 3 3   Change in appetite 2 2 2   Feeling bad or failure about yourself  0 - 0  Trouble concentrating 1 0 2  Moving slowly or fidgety/restless 3 2 2   Suicidal thoughts 0 0 0  PHQ-9 Score 15 14 13   Some recent data might be hidden      CMP Latest Ref Rng & Units 09/08/2018 06/10/2018 06/01/2018  Glucose 65 - 99 mg/dL 93 113(H) 75  BUN 8 - 27 mg/dL 10 9 11   Creatinine 0.57 - 1.00 mg/dL 0.82 0.89 0.79  Sodium 134 - 144 mmol/L 139 139 138  Potassium 3.5 - 5.2 mmol/L 4.4 4.5 4.9  Chloride 96 - 106 mmol/L 100 99 100  CO2 20 - 29 mmol/L 25 23 25   Calcium 8.7 - 10.3 mg/dL 10.0 9.7 9.8  Total Protein 6.0 - 8.5 g/dL - - -  Total Bilirubin 0.0 - 1.2  mg/dL - - -  Alkaline Phos 39 - 117 IU/L - - -  AST 0 - 40 IU/L - - -  ALT 0 - 32 IU/L - - -   Lipid Panel     Component Value Date/Time   CHOL 191 12/16/2015 0950   TRIG 65 12/16/2015 0950   HDL 84 12/16/2015 0950   CHOLHDL 2.3 12/16/2015 0950   VLDL 13 12/16/2015 0950   LDLCALC 94 12/16/2015 0950    CBC    Component Value Date/Time   WBC 6.8 03/08/2018 1137   RBC 4.23 03/08/2018 1137   HGB 11.7 (L) 03/08/2018 1137   HGB 12.0 01/20/2017 1001   HCT 36.9 03/08/2018 1137   HCT 38.2 01/20/2017 1001   HCT 41 06/25/2011 1053   PLT 362.0 03/08/2018 1137   PLT 356 01/20/2017 1001   MCV 87.4 03/08/2018 1137   MCV 90 01/20/2017 1001   MCV 91.1 06/25/2011 1053   MCH 27.1 01/17/2018 1422   MCHC 31.6 03/08/2018 1137   RDW 14.2 03/08/2018 1137   RDW 13.1 01/20/2017 1001   LYMPHSABS 3.4 03/08/2018 1137   LYMPHSABS 3.4 (H) 01/20/2017 1001   MONOABS 1.0 03/08/2018 1137   EOSABS 0.1 03/08/2018 1137   EOSABS 0.1 01/20/2017 1001   BASOSABS 0.1 03/08/2018 1137   BASOSABS 0.0 01/20/2017 1001    ASSESSMENT AND PLAN: 1. Prediabetes 2. Morbid obesity ([place]) Encouraged her to continue trying to eat healthy.  She has seen the nutritionist a few times.  Encouraged her to try to stay active.  She plans to follow-up with the bariatric surgeon later this month - POCT glucose (manual entry) - Lipid panel  3. Essential hypertension At goal.  Continue current medications that include amlodipine, Cozaar/HCTZ, metoprolol and hydralazine - Comprehensive metabolic panel  4. Persistent depressive disorder With increased life stresses.  She is agreeable to increasing Cymbalta from 20 mg daily to 30 mg daily.  She will continue to follow-up with her therapist. - DULoxetine (CYMBALTA) 30 MG capsule; Take 1 capsule (30 mg total) by mouth daily.  Dispense: 30 capsule; Refill: 6  5. Primary osteoarthritis of both knees Continue Celebrex.  Continue to encourage weight loss.  Prescription given for  Rollator walker - DME Other see comment  6. Pituitary macroadenoma [facility]) Patient with history of excision.  We will schedule follow-up MRI - MR Brain W Wo Contrast; Future   Patient was given the opportunity to ask questions.  Patient verbalized understanding of the plan and was  able to repeat key elements of the plan.   Orders Placed This Encounter  Procedures  . DME Other see comment  . MR Brain W Wo Contrast  . Lipid panel  . Comprehensive metabolic panel  . POCT glucose (manual entry)     Requested Prescriptions   Signed Prescriptions Disp Refills  . DULoxetine (CYMBALTA) 30 MG capsule 30 capsule 6    Sig: Take 1 capsule (30 mg total) by mouth daily.    Return in about 3 months (around 03/11/2019).  Karle Plumber, MD, FACP

## 2018-12-09 NOTE — Patient Instructions (Signed)
We have increased the dose of Cymbalta to 30 mg daily.  Continue to work on healthy eating habits.   We have submitted a referral for an MRI of the brain.

## 2018-12-12 ENCOUNTER — Telehealth: Payer: Self-pay

## 2018-12-12 MED FILL — AMITRIPTYLINE HCL 50 MG TAB: 50 | 30 days supply | Qty: 30 | Fill #1

## 2018-12-12 MED FILL — METOPROLOL TARTRATE 75 MG T: 75 | 30 days supply | Qty: 60 | Fill #2

## 2018-12-12 NOTE — Telephone Encounter (Signed)
Went on the [employer] and did prior auth for MRI    CPT- [phone] MRI Brain with and without contrast  ICD- D35.2 Benign neoplasm of pituitary gland   Authorization # [id number]  Effective- 12/12/2018 Expires-01/11/2019  Contacted the scheduling department and spoke to [facility]   MRI is scheduled for December 21, 2018 @4pm  @[facility]. Pt is to arrive @345pm   Contacted pt to go over MRI appointment information. Pt is aware and doesn't have any questions or concerns

## 2018-12-16 MED FILL — hydrALAZINE HCL 50 MG TABS: 50 | 30 days supply | Qty: 180 | Fill #4

## 2018-12-16 MED FILL — BUPROPION HCL XL 300 MG TAB: 300 | 30 days supply | Qty: 30 | Fill #1

## 2018-12-21 ENCOUNTER — Ambulatory Visit (HOSPITAL_COMMUNITY): Admission: RE | Admit: 2018-12-21 | Payer: Medicaid Other | Source: Ambulatory Visit

## 2018-12-26 ENCOUNTER — Other Ambulatory Visit: Payer: Self-pay | Admitting: Internal Medicine

## 2018-12-26 NOTE — Telephone Encounter (Signed)
MW please advise if ok to refill.

## 2018-12-27 MED FILL — GABAPENTIN 300 MG CAPSULE: 300 | 30 days supply | Qty: 120 | Fill #0

## 2018-12-28 MED FILL — CELECOXIB 100 MG CAPSULE: 100 | 30 days supply | Qty: 60 | Fill #1

## 2018-12-30 ENCOUNTER — Ambulatory Visit: Payer: Medicaid Other | Admitting: Podiatry

## 2018-12-30 ENCOUNTER — Other Ambulatory Visit: Payer: Self-pay

## 2018-12-30 ENCOUNTER — Encounter: Payer: Self-pay | Admitting: Podiatry

## 2018-12-30 VITALS — Temp 98.3°F

## 2018-12-30 DIAGNOSIS — B351 Tinea unguium: Secondary | ICD-10-CM

## 2018-12-30 DIAGNOSIS — R7303 Prediabetes: Secondary | ICD-10-CM

## 2018-12-30 DIAGNOSIS — M79675 Pain in left toe(s): Secondary | ICD-10-CM | POA: Diagnosis not present

## 2018-12-30 DIAGNOSIS — M79674 Pain in right toe(s): Secondary | ICD-10-CM | POA: Diagnosis not present

## 2018-12-30 DIAGNOSIS — G629 Polyneuropathy, unspecified: Secondary | ICD-10-CM | POA: Diagnosis not present

## 2018-12-30 DIAGNOSIS — L84 Corns and callosities: Secondary | ICD-10-CM | POA: Diagnosis not present

## 2019-01-02 ENCOUNTER — Encounter: Payer: Self-pay | Admitting: Podiatry

## 2019-01-02 MED FILL — metFORMIN HCL 500 MG TABS: 500 | 30 days supply | Qty: 15 | Fill #1

## 2019-01-02 NOTE — Progress Notes (Signed)
Subjective: Robin [name] presents with diabetes, diabetic neuropathy and cc of painful, discolored, thick toenails and painful callus/corn which interfere with activities of daily living. Pain is aggravated when wearing enclosed shoe gear. Pain is relieved with periodic professional debridement.  Ladell Pier, MD is her PCP and last visit was 09/08/2018.   Current Outpatient Medications:  .  ACCU-CHEK AVIVA PLUS test strip, USE AS DIRECTED, Disp: 100 each, Rfl: 12 .  acetaminophen (TYLENOL) 500 MG tablet, Take 1,000 mg by mouth 2 (two) times daily. Per bottle as needed , Disp: , Rfl:  .  amitriptyline (ELAVIL) 50 MG tablet, TAKE 1 TABLET BY MOUTH AT BEDTIME., Disp: 30 tablet, Rfl: 2 .  amLODipine (NORVASC) 5 MG tablet, , Disp: , Rfl:  .  benzonatate (TESSALON) 200 MG capsule, Take 1 capsule (200 mg total) by mouth 3 (three) times daily as needed for cough., Disp: 45 capsule, Rfl: 2 .  buPROPion (WELLBUTRIN XL) 300 MG 24 hr tablet, TAKE 1 TABLET (300 MG TOTAL) BY MOUTH EVERY MORNING., Disp: 90 tablet, Rfl: 0 .  celecoxib (CELEBREX) 100 MG capsule, Take 100 mg by mouth 2 (two) times daily., Disp: , Rfl:  .  chlorpheniramine (CHLOR-TRIMETON) 4 MG tablet, 1 tab by mouth daily at bedtime, may have 1 every 4 hours as needed, Disp: , Rfl:  .  dexlansoprazole (DEXILANT) 60 MG capsule, Take 1 capsule (60 mg total) by mouth daily., Disp: 30 capsule, Rfl: 3 .  DULoxetine (CYMBALTA) 30 MG capsule, Take 1 capsule (30 mg total) by mouth daily., Disp: 30 capsule, Rfl: 6 .  ergocalciferol (VITAMIN D2) 1.25 MG (50000 UT) capsule, ergocalciferol (vitamin D2) 1,250 mcg (50,000 unit) capsule, Disp: , Rfl:  .  fluticasone (FLONASE) 50 MCG/ACT nasal spray, Place 2 sprays into both nostrils daily., Disp: 16 g, Rfl: 6 .  furosemide (LASIX) 40 MG tablet, TAKE 1/2 TO 1 TABLET BY MOUTH DAILY FOR LOWER EXTREMITY SWELLING, Disp: 30 tablet, Rfl: 0 .  gabapentin (NEURONTIN) 300 MG capsule, TAKE 1 CAPSULE (300 MG TOTAL)  BY MOUTH 4 (FOUR) TIMES DAILY., Disp: 120 capsule, Rfl: 0 .  glucose blood test strip, Accu-Chek Aviva Plus test strips, Disp: , Rfl:  .  hydrALAZINE (APRESOLINE) 50 MG tablet, Take 2 tablets (100 mg total) by mouth 3 (three) times daily., Disp: 180 tablet, Rfl: 5 .  HYDROcodone-acetaminophen (NORCO/VICODIN) 5-325 MG tablet, Take 1 tablet by mouth every 8 (eight) hours as needed for severe pain. , Disp: , Rfl:  .  levothyroxine (SYNTHROID, LEVOTHROID) 88 MCG tablet, TAKE 1 TABLET BY MOUTH DAILY., Disp: 30 tablet, Rfl: 2 .  LINZESS 290 MCG CAPS capsule, TAKE 1 CAPSULE BY MOUTH DAILY BEFORE BREAKFAST., Disp: 30 capsule, Rfl: 5 .  losartan-hydrochlorothiazide (HYZAAR) 100-25 MG tablet, losartan 100 mg-hydrochlorothiazide 25 mg tablet  TAKE 1 TABLET BY MOUTH ONCE DAILY., Disp: , Rfl:  .  metFORMIN (GLUCOPHAGE) 500 MG tablet, TAKE 1/2 TABLET DAILY WITH BREAKFAST, Disp: 15 tablet, Rfl: 2 .  Metoprolol Tartrate 75 MG TABS, TAKE 1 TABLET BY MOUTH TWICE DAILY., Disp: 60 tablet, Rfl: 2 .  montelukast (SINGULAIR) 10 MG tablet, Take 1 tablet (10 mg total) by mouth at bedtime., Disp: 30 tablet, Rfl: 4 .  Multiple Vitamin (MULTIVITAMIN) tablet, Take 1 tablet by mouth daily. Reported on 12/09/2015, Disp: 90 tablet, Rfl: 1 .  polyethylene glycol-electrolytes (TRILYTE) 420 g solution, Take 4,000 mLs by mouth as directed., Disp: 4000 mL, Rfl: 0 .  PREMARIN vaginal cream, , Disp: , Rfl:  3 .  PROVENTIL HFA 108 (90 Base) MCG/ACT inhaler, INHALE 2 PUFFS INTO THE LUNGS EVERY 6 HOURS AS NEEDED FOR WHEEZING OR SHORTNESS OF BREATH., Disp: 6.7 g, Rfl: 2 .  RABEprazole (ACIPHEX) 20 MG tablet, Take 1 tablet (20 mg total) by mouth 2 (two) times daily with a meal., Disp: 180 tablet, Rfl: 3 .  sodium chloride (OCEAN) 0.65 % SOLN nasal spray, Place 2 sprays into both nostrils every 4 (four) hours as needed for congestion., Disp: , Rfl:  .  spironolactone (ALDACTONE) 25 MG tablet, TAKE 1 TABLET (25 MG TOTAL) BY MOUTH DAILY., Disp: 30  tablet, Rfl: 2 .  vitamin B-12 (CYANOCOBALAMIN) 1000 MCG tablet, Take 1,000 mcg by mouth daily., Disp: , Rfl:   Allergies  Allergen Reactions  . Celexa [Citalopram Hydrobromide] Other (See Comments)    "Made me feel out of it"  . Gabapentin     Contributed to lower extremity edema  . Norvasc [Amlodipine Besylate]     Edema in lower extremities  . Diflucan [Fluconazole] Rash    Vascular Examination: Capillary refill time immediate x 10 digits.  Dorsalis pedis pulses palpable b/l.  Posterior tibial pulses palpable b/l.  Dgital hair absent x 10 digits.  Skin temperature gradient WNL b/l.  Dermatological Examination: Skin with normal turgor, texture and tone b/l.  Toenails 1-5 b/l discolored, thick, dystrophic with subungual debris and pain with palpation to nailbeds due to thickness of nails.  Hyperkeratotic lesion submet head 5 b/l. No erythema, no edema, no drainage, no flocculence noted.   Musculoskeletal: Muscle strength 5/5 to all LE muscle groups  Neurological: Sensation absent with 10 gram monofilament.  Assessment: 1. Painful onychomycosis toenails 1-5 b/l 2. Calluses submet head 5 b/l 3. Neuropathy 4. Prediabetes  Plan: 1. Continue diabetic foot care principles. Literature dispensed on today. 2. Toenails 1-5 b/l were debrided in length and girth without iatrogenic bleeding. 3. Calluses pared submetatarsal head(s) 5 b/l utilizing sterile scalpel blade without incident.  4. Patient to continue soft, supportive shoe gear 5. Patient to report any pedal injuries to medical professional  6. Follow up 3 months.  7. Patient/POA to call should there be a concern in the interim.

## 2019-01-03 MED FILL — LINZESS 290 MCG CAPSULE: 290 | 30 days supply | Qty: 30 | Fill #5

## 2019-01-07 MED FILL — SPIRONOLACTONE 25 MG TABLET: 25 | 30 days supply | Qty: 30 | Fill #0

## 2019-01-10 ENCOUNTER — Telehealth: Payer: Self-pay | Admitting: Internal Medicine

## 2019-01-11 ENCOUNTER — Other Ambulatory Visit: Payer: Self-pay

## 2019-01-11 ENCOUNTER — Encounter: Payer: Self-pay | Admitting: Family Medicine

## 2019-01-11 ENCOUNTER — Ambulatory Visit: Payer: Medicaid Other | Attending: Family Medicine | Admitting: Family Medicine

## 2019-01-11 DIAGNOSIS — J0120 Acute ethmoidal sinusitis, unspecified: Secondary | ICD-10-CM

## 2019-01-11 DIAGNOSIS — R51 Headache: Secondary | ICD-10-CM

## 2019-01-11 DIAGNOSIS — R519 Headache, unspecified: Secondary | ICD-10-CM

## 2019-01-11 MED ORDER — CETIRIZINE HCL 10 MG PO TABS: 10.0000 mg | ORAL_TABLET | Freq: Every day | ORAL | 1 refills | Status: DC

## 2019-01-11 MED ORDER — FLUTICASONE PROPIONATE 50 MCG/ACT NA SUSP: 1.0000 | Freq: Every day | NASAL | 1 refills | Status: DC

## 2019-01-11 NOTE — Progress Notes (Signed)
Patient has been called and DOB has been verified. Patient has been screened and transferred to PCP to start phone visit.  C/C: headaches, sore throat x 1 week.

## 2019-01-11 NOTE — Progress Notes (Signed)
Virtual Visit via Telephone Note  I connected with Robin [name] on 01/11/19 at  3:10 PM EDT by telephone and verified that I am speaking with the correct person using two identifiers.   I discussed the limitations, risks, security and privacy concerns of performing an evaluation and management service by telephone and the availability of in person appointments. I also discussed with the patient that there may be a patient responsible charge related to this service. The patient expressed understanding and agreed to proceed.   History of Present Illness: Robin [name] is a 63 year old female with a history of prediabetes (A1c 5.4), hypertension, depression, osteoarthritis who is seen today for an acute visit complaining of one-week history of 'not feeling good'. She describes this as headache which is generalized and sometimes located mainly above her nasal bridge, sore throat, cough with production of yellowish sputum, postnasal drip, right ear pain, decreased energy, facial pain, nasal congestion.  Denies presence of fever and has chronic arthralgias from osteoarthritis but no new myalgias or joint aches.  Denies blurry vision, nausea or vomiting but has had increased fatigue.  She denies history of contact with known or suspicious case of COVID-19 however her granddaughter was sick with a cough 4 days ago.   Observations/Objective: Awake, alert, oriented x3 Not in acute distress  Assessment and Plan: 1. Sinus headache Advised to rest, use Tylenol - fluticasone (FLONASE) 50 MCG/ACT nasal spray; Place 1 spray into both nostrils daily.  Dispense: 16 g; Refill: 1 - cetirizine (ZYRTEC) 10 MG tablet; Take 1 tablet (10 mg total) by mouth daily.  Dispense: 30 tablet; Refill: 1  2. Acute non-recurrent ethmoidal sinusitis See #1 above If symptoms do not improve Advised to call the clinic back and I will place her on an antibiotic - fluticasone (FLONASE) 50 MCG/ACT nasal spray; Place 1 spray into  both nostrils daily.  Dispense: 16 g; Refill: 1 - cetirizine (ZYRTEC) 10 MG tablet; Take 1 tablet (10 mg total) by mouth daily.  Dispense: 30 tablet; Refill: 1   Follow Up Instructions: I discussed the assessment and treatment plan with the patient. The patient was provided an opportunity to ask questions and all were answered. The patient agreed with the plan and demonstrated an understanding of the instructions.   The patient was advised to call back or seek an in-person evaluation if the symptoms worsen or if the condition fails to improve as anticipated.  I provided 10 minutes of non-face-to-face time during this encounter.   Charlott Rakes, MD

## 2019-01-12 ENCOUNTER — Telehealth: Payer: Self-pay | Admitting: *Deleted

## 2019-01-12 MED FILL — CETIRIZINE HCL 10 MG TABLET: 10 | 30 days supply | Qty: 30 | Fill #0

## 2019-01-12 MED FILL — LEVOTHYROXINE 88 MCG TABLET: 88 | 30 days supply | Qty: 30 | Fill #1

## 2019-01-12 MED FILL — FLUTICASONE PROP 50 MCG SPR: 50 | 30 days supply | Qty: 16 | Fill #0

## 2019-01-12 NOTE — Telephone Encounter (Signed)
Called patient and she rescheduled procedure to 6/22 at 7:30am. Patient aware new instructions/pre-op will be mailed.

## 2019-01-16 ENCOUNTER — Other Ambulatory Visit: Payer: Self-pay | Admitting: Internal Medicine

## 2019-01-16 ENCOUNTER — Encounter (HOSPITAL_COMMUNITY): Payer: Medicaid Other

## 2019-01-16 DIAGNOSIS — J309 Allergic rhinitis, unspecified: Secondary | ICD-10-CM

## 2019-01-16 MED FILL — METOPROLOL TARTRATE 75 MG T: 75 | 30 days supply | Qty: 60 | Fill #0

## 2019-01-16 MED FILL — MONTELUKAST SOD 10 MG TAB: 10 | 30 days supply | Qty: 30 | Fill #0

## 2019-01-16 MED FILL — AMITRIPTYLINE HCL 50 MG TAB: 50 | 30 days supply | Qty: 30 | Fill #2

## 2019-01-17 DIAGNOSIS — M79672 Pain in left foot: Secondary | ICD-10-CM | POA: Diagnosis not present

## 2019-01-17 DIAGNOSIS — M79671 Pain in right foot: Secondary | ICD-10-CM | POA: Diagnosis not present

## 2019-01-17 DIAGNOSIS — M25562 Pain in left knee: Secondary | ICD-10-CM | POA: Diagnosis not present

## 2019-01-17 DIAGNOSIS — M25561 Pain in right knee: Secondary | ICD-10-CM | POA: Diagnosis not present

## 2019-01-17 MED FILL — BUPROPION HCL XL 300 MG TAB: 300 | 30 days supply | Qty: 30 | Fill #2

## 2019-01-17 MED FILL — hydrALAZINE HCL 50 MG TABS: 50 | 30 days supply | Qty: 180 | Fill #5

## 2019-01-17 MED FILL — DULoxetine HCL 30 MG CPEP: 30 | 30 days supply | Qty: 30 | Fill #1

## 2019-01-20 ENCOUNTER — Telehealth: Payer: Self-pay

## 2019-01-20 NOTE — Telephone Encounter (Signed)
PA for Dexilant 60 mgwas approved through [employer] until 01/15/2020. Pt is aware of approval. Letter will be scanned when received.

## 2019-01-31 ENCOUNTER — Other Ambulatory Visit: Payer: Self-pay | Admitting: Internal Medicine

## 2019-01-31 MED FILL — GABAPENTIN 300 MG CAPSULE: 300 | 30 days supply | Qty: 120 | Fill #0

## 2019-01-31 MED FILL — SPIRONOLACTONE 25 MG TABLET: 25 | 30 days supply | Qty: 30 | Fill #1

## 2019-02-01 MED FILL — metFORMIN HCL 500 MG TABS: 500 | 30 days supply | Qty: 15 | Fill #2

## 2019-02-01 MED FILL — DEXILANT DR 60 MG CAPSULE: 60 | 30 days supply | Qty: 30 | Fill #1

## 2019-02-04 MED FILL — LEVOTHYROXINE 88 MCG TABLET: 88 | 30 days supply | Qty: 30 | Fill #2

## 2019-02-07 MED FILL — CELECOXIB 100 MG CAPSULE: 100 | 30 days supply | Qty: 60 | Fill #2

## 2019-02-09 ENCOUNTER — Other Ambulatory Visit: Payer: Self-pay | Admitting: Internal Medicine

## 2019-02-09 DIAGNOSIS — I10 Essential (primary) hypertension: Secondary | ICD-10-CM

## 2019-02-09 MED FILL — AMITRIPTYLINE HCL 50 MG TAB: 50 | 30 days supply | Qty: 30 | Fill #0

## 2019-02-09 MED FILL — FUROSEMIDE 40 MG TAB: 40 | 30 days supply | Qty: 30 | Fill #0

## 2019-02-14 ENCOUNTER — Other Ambulatory Visit: Payer: Self-pay | Admitting: Internal Medicine

## 2019-02-14 ENCOUNTER — Other Ambulatory Visit: Payer: Self-pay | Admitting: Nurse Practitioner

## 2019-02-14 MED FILL — METOPROLOL TARTRATE 75 MG T: 75 | 30 days supply | Qty: 60 | Fill #1

## 2019-02-14 MED FILL — BUPROPION HCL XL 300 MG TAB: 300 | 90 days supply | Qty: 90 | Fill #0

## 2019-02-14 MED FILL — LINZESS 290 MCG CAPSULE: 290 | 90 days supply | Qty: 90 | Fill #0

## 2019-02-14 MED FILL — MONTELUKAST SOD 10 MG TAB: 10 | 30 days supply | Qty: 30 | Fill #1

## 2019-03-02 DIAGNOSIS — M79641 Pain in right hand: Secondary | ICD-10-CM | POA: Diagnosis not present

## 2019-03-02 DIAGNOSIS — M79642 Pain in left hand: Secondary | ICD-10-CM | POA: Diagnosis not present

## 2019-03-02 DIAGNOSIS — M25551 Pain in right hip: Secondary | ICD-10-CM | POA: Diagnosis not present

## 2019-03-02 DIAGNOSIS — M25552 Pain in left hip: Secondary | ICD-10-CM | POA: Diagnosis not present

## 2019-03-03 ENCOUNTER — Other Ambulatory Visit: Payer: Self-pay | Admitting: Internal Medicine

## 2019-03-03 MED FILL — CETIRIZINE HCL 10 MG TABLET: 10 | 30 days supply | Qty: 30 | Fill #1

## 2019-03-03 MED FILL — metFORMIN HCL 500 MG TABS: 500 | 30 days supply | Qty: 15 | Fill #0

## 2019-03-03 MED FILL — SPIRONOLACTONE 25 MG TABLET: 25 | 30 days supply | Qty: 30 | Fill #2

## 2019-03-03 MED FILL — DEXILANT DR 60 MG CAPSULE: 60 | 30 days supply | Qty: 30 | Fill #2

## 2019-03-10 DIAGNOSIS — F331 Major depressive disorder, recurrent, moderate: Secondary | ICD-10-CM | POA: Diagnosis not present

## 2019-03-10 DIAGNOSIS — F54 Psychological and behavioral factors associated with disorders or diseases classified elsewhere: Secondary | ICD-10-CM | POA: Diagnosis not present

## 2019-03-10 DIAGNOSIS — Z7189 Other specified counseling: Secondary | ICD-10-CM | POA: Diagnosis not present

## 2019-03-13 MED FILL — METOPROLOL TARTRATE 75 MG T: 75 | 30 days supply | Qty: 60 | Fill #2

## 2019-03-13 MED FILL — hydrALAZINE HCL 50 MG TABS: 50 | 20 days supply | Qty: 60 | Fill #1

## 2019-03-13 MED FILL — AMITRIPTYLINE HCL 50 MG TAB: 50 | 30 days supply | Qty: 30 | Fill #1

## 2019-03-13 MED FILL — AMLODIPINE BESYLATE 5 MG TA: 5 | 90 days supply | Qty: 90 | Fill #0

## 2019-03-13 MED FILL — LEVOTHYROXINE 88 MCG TABLET: 88 | 30 days supply | Qty: 30 | Fill #0

## 2019-03-13 MED FILL — MONTELUKAST SOD 10 MG TAB: 10 | 30 days supply | Qty: 30 | Fill #2

## 2019-03-14 NOTE — Patient Instructions (Signed)
Robin [name]  03/14/2019     @PREFPERIOPPHARMACY @   Your procedure is scheduled on  03/27/2019 .  Report to [facility] at  615 A.M.  Call this number if you have problems the morning of surgery:  [phone]   Remember:  Follow the diet and prep instructions given to you by Dr Roseanne Kaufman office.                     Take these medicines the morning of surgery with A SIP OF WATER  Amlodipine, wellbutrin, celebrex, dexilant, allegra, gabapentin, hydrocodone(if needed), levotyroxine, metoprolol. Use your inhaler before you come.    Do not wear jewelry, make-up or nail polish.  Do not wear lotions, powders, or perfumes, or deodorant.  Do not shave 48 hours prior to surgery.  Men may shave face and neck.  Do not bring valuables to the hospital.  [facility] is not responsible for any belongings or valuables.  Contacts, dentures or bridgework may not be worn into surgery.  Leave your suitcase in the car.  After surgery it may be brought to your room.  For patients admitted to the hospital, discharge time will be determined by your treatment team.  Patients discharged the day of surgery will not be allowed to drive home.   Name and phone number of your driver:   family Special instructions:    Please read over the following fact sheets that you were given. Anesthesia Post-op Instructions and Care and Recovery After Surgery       Colonoscopy, Adult, Care After This sheet gives you information about how to care for yourself after your procedure. Your health care provider may also give you more specific instructions. If you have problems or questions, contact your health care provider. What can I expect after the procedure? After the procedure, it is common to have:  A small amount of blood in your stool for 24 hours after the procedure.  Some gas.  Mild abdominal cramping or bloating. Follow these instructions at home: General instructions  For the first 24 hours  after the procedure: ? Do not drive or use machinery. ? Do not sign important documents. ? Do not drink alcohol. ? Do your regular daily activities at a slower pace than normal. ? Eat soft, easy-to-digest foods.  Take over-the-counter or prescription medicines only as told by your health care provider. Relieving cramping and bloating   Try walking around when you have cramps or feel bloated.  Apply heat to your abdomen as told by your health care provider. Use a heat source that your health care provider recommends, such as a moist heat pack or a heating pad. ? Place a towel between your skin and the heat source. ? Leave the heat on for 20-30 minutes. ? Remove the heat if your skin turns bright red. This is especially important if you are unable to feel pain, heat, or cold. You may have a greater risk of getting burned. Eating and drinking   Drink enough fluid to keep your urine pale yellow.  Resume your normal diet as instructed by your health care provider. Avoid heavy or fried foods that are hard to digest.  Avoid drinking alcohol for as long as instructed by your health care provider. Contact a health care provider if:  You have blood in your stool 2-3 days after the procedure. Get help right away if:  You have more than a small spotting of  blood in your stool.  You pass large blood clots in your stool.  Your abdomen is swollen.  You have nausea or vomiting.  You have a fever.  You have increasing abdominal pain that is not relieved with medicine. Summary  After the procedure, it is common to have a small amount of blood in your stool. You may also have mild abdominal cramping and bloating.  For the first 24 hours after the procedure, do not drive or use machinery, sign important documents, or drink alcohol.  Contact your health care provider if you have a lot of blood in your stool, nausea or vomiting, a fever, or increased abdominal pain. This information is not  intended to replace advice given to you by your health care provider. Make sure you discuss any questions you have with your health care provider. Document Released: 05/05/2004 Document Revised: 07/14/2017 Document Reviewed: 12/03/2015 Elsevier Interactive Patient Education  2019 [place], Care After These instructions provide you with information about caring for yourself after your procedure. Your health care provider may also give you more specific instructions. Your treatment has been planned according to current medical practices, but problems sometimes occur. Call your health care provider if you have any problems or questions after your procedure. What can I expect after the procedure? After your procedure, you may:  Feel sleepy for several hours.  Feel clumsy and have poor balance for several hours.  Feel forgetful about what happened after the procedure.  Have poor judgment for several hours.  Feel nauseous or vomit.  Have a sore throat if you had a breathing tube during the procedure. Follow these instructions at home: For at least 24 hours after the procedure:      Have a responsible adult stay with you. It is important to have someone help care for you until you are awake and alert.  Rest as needed.  Do not: ? Participate in activities in which you could fall or become injured. ? Drive. ? Use heavy machinery. ? Drink alcohol. ? Take sleeping pills or medicines that cause drowsiness. ? Make important decisions or sign legal documents. ? Take care of children on your own. Eating and drinking  Follow the diet that is recommended by your health care provider.  If you vomit, drink water, juice, or soup when you can drink without vomiting.  Make sure you have little or no nausea before eating solid foods. General instructions  Take over-the-counter and prescription medicines only as told by your health care provider.  If you have  sleep apnea, surgery and certain medicines can increase your risk for breathing problems. Follow instructions from your health care provider about wearing your sleep device: ? Anytime you are sleeping, including during daytime naps. ? While taking prescription pain medicines, sleeping medicines, or medicines that make you drowsy.  If you smoke, do not smoke without supervision.  Keep all follow-up visits as told by your health care provider. This is important. Contact a health care provider if:  You keep feeling nauseous or you keep vomiting.  You feel light-headed.  You develop a rash.  You have a fever. Get help right away if:  You have trouble breathing. Summary  For several hours after your procedure, you may feel sleepy and have poor judgment.  Have a responsible adult stay with you for at least 24 hours or until you are awake and alert. This information is not intended to replace advice given to you by your  health care provider. Make sure you discuss any questions you have with your health care provider. Document Released: 01/12/2016 Document Revised: 05/07/2017 Document Reviewed: 01/12/2016 Elsevier Interactive Patient Education  2019 [employer].

## 2019-03-17 ENCOUNTER — Encounter: Payer: Self-pay | Admitting: Internal Medicine

## 2019-03-17 ENCOUNTER — Other Ambulatory Visit: Payer: Self-pay

## 2019-03-17 ENCOUNTER — Ambulatory Visit: Payer: Medicaid Other | Attending: Internal Medicine | Admitting: Internal Medicine

## 2019-03-17 DIAGNOSIS — I10 Essential (primary) hypertension: Secondary | ICD-10-CM

## 2019-03-17 DIAGNOSIS — M5412 Radiculopathy, cervical region: Secondary | ICD-10-CM | POA: Diagnosis not present

## 2019-03-17 DIAGNOSIS — E039 Hypothyroidism, unspecified: Secondary | ICD-10-CM | POA: Diagnosis not present

## 2019-03-17 DIAGNOSIS — R7303 Prediabetes: Secondary | ICD-10-CM

## 2019-03-17 MED ORDER — ONETOUCH ULTRA VI STRP: ORAL_STRIP | 12 refills | Status: DC

## 2019-03-17 MED FILL — ACCU-CHEK AVIVA PLUS TEST S: 30 days supply | Qty: 100 | Fill #0

## 2019-03-17 NOTE — Progress Notes (Signed)
Pt states her right shoulder has been giving her some issues. Pt states the pain radiates down to her elbow

## 2019-03-17 NOTE — Progress Notes (Signed)
Virtual Visit via Telephone Note Due to current restrictions/limitations of in-office visits due to the COVID-19 pandemic, this scheduled clinical appointment was converted to a telehealth visit  I connected with Robin [name] on 03/17/19 at 10:40 a.m EDT by telephone and verified that I am speaking with the correct person using two identifiers. I am in my office.  The patient is at home.  Only the patient and myself participated in this encounter.  I discussed the limitations, risks, security and privacy concerns of performing an evaluation and management service by telephone and the availability of in person appointments. I also discussed with the patient that there may be a patient responsible charge related to this service. The patient expressed understanding and agreed to proceed.   History of Present Illness: Patient with history ofHTN, pre-DM,OSA, fibromyalgia, depression, obesity, GERD, hypothyroid, OA of multiple js (hands, knees, shoulders).  Patient was last seen 12/2018.  Purpose of today's visit was for chronic disease management.  Pain in RT side neck shoulder: feels pain in RT shoulder getting worse.  Reports hx of tear in the shoulder in 2002.   -"it hurts really back."  Pain feels like it starts in the neck that goes down the arm. Feeling of electricity in the arm that goes into the hand.  "I don't have no strength in my arm and I don't have no strength in my hand." -thinks she may have done something to aggravate it but she is not sure what -symptoms present x few mths -out of Hydrocodone.  Her pain specialist is currently going through a prior approval process for her.  She plans to call him today to follow-up and see whether approval was granted.  HTN:  Checks BP regular.  Reports good readings. BP 124/60 this a.m Limits salt in foods Some LE edema.  She is taking the furosemide and spironolactone. Reports compliance with other blood pressure medications  No chest  pains  Thyroid: compliant with Levothyroxine  Pre-DM/Obesity:  Last wgh 326 lbs. Down 5 lbs since last visit -last 2 appts with [facility] cancel due to [facility]. Appt schedule for next mth.  She admits that she struggles with her eating habits over the past 2 months due to the stress of COVID pandemic.  Depression:  Still seeing counselor regular.  Finds this and Cymbalta helpful  Current Outpatient Medications on File Prior to Visit  Medication Sig Dispense Refill  . ACCU-CHEK AVIVA PLUS test strip USE AS DIRECTED 100 each 12  . acetaminophen (TYLENOL) 500 MG tablet Take 1,000 mg by mouth 2 (two) times daily.     Marland Kitchen amitriptyline (ELAVIL) 50 MG tablet TAKE 1 TABLET BY MOUTH AT BEDTIME. 30 tablet 2  . amLODipine (NORVASC) 5 MG tablet Take 5 mg by mouth daily.     Marland Kitchen aspirin EC 81 MG tablet Take 81 mg by mouth at bedtime.    . benzonatate (TESSALON) 200 MG capsule Take 1 capsule (200 mg total) by mouth 3 (three) times daily as needed for cough. (Patient not taking: Reported on 01/09/2019) 45 capsule 2  . buPROPion (WELLBUTRIN XL) 300 MG 24 hr tablet TAKE 1 TABLET (300 MG TOTAL) BY MOUTH EVERY MORNING. 90 tablet 0  . calcium carbonate (TUMS - DOSED IN MG ELEMENTAL CALCIUM) 500 MG chewable tablet Chew 2 tablets by mouth at bedtime.    . celecoxib (CELEBREX) 100 MG capsule Take 100 mg by mouth 2 (two) times daily.    . cetirizine (ZYRTEC) 10 MG  tablet Take 1 tablet (10 mg total) by mouth daily. 30 tablet 1  . chlorpheniramine (CHLOR-TRIMETON) 4 MG tablet Take 4 mg by mouth every 4 (four) hours as needed for allergies.     Marland Kitchen CINNAMON PO Take 1 capsule by mouth daily.    Marland Kitchen dexlansoprazole (DEXILANT) 60 MG capsule Take 1 capsule (60 mg total) by mouth daily. 30 capsule 3  . diclofenac sodium (VOLTAREN) 1 % GEL Apply 2-4 g topically 4 (four) times daily as needed (PAIN.).    Marland Kitchen DULoxetine (CYMBALTA) 30 MG capsule Take 1 capsule (30 mg total) by mouth daily. (Patient taking differently: Take 30 mg  by mouth at bedtime. ) 30 capsule 6  . fexofenadine (ALLEGRA) 180 MG tablet Take 180 mg by mouth daily as needed for allergies or rhinitis.    . fluticasone (FLONASE) 50 MCG/ACT nasal spray Place 1 spray into both nostrils daily. 16 g 1  . furosemide (LASIX) 40 MG tablet TAKE 1/2 TO 1 TABLET BY MOUTH DAILY FOR LOWER EXTREMITY SWELLING 30 tablet 0  . gabapentin (NEURONTIN) 300 MG capsule TAKE 1 CAPSULE (300 MG TOTAL) BY MOUTH 4 (FOUR) TIMES DAILY. 120 capsule 0  . glucose blood test strip Accu-Chek Aviva Plus test strips    . hydrALAZINE (APRESOLINE) 50 MG tablet Take 2 tablets (100 mg total) by mouth 3 (three) times daily. 180 tablet 5  . HYDROcodone-acetaminophen (NORCO/VICODIN) 5-325 MG tablet Take 1 tablet by mouth 2 (two) times daily.     Marland Kitchen levothyroxine (SYNTHROID) 88 MCG tablet TAKE 1 TABLET BY MOUTH DAILY. 30 tablet 2  . LINZESS 290 MCG CAPS capsule TAKE 1 CAPSULE BY MOUTH DAILY BEFORE BREAKFAST. 30 capsule 5  . metFORMIN (GLUCOPHAGE) 500 MG tablet TAKE 1/2 TABLET DAILY WITH BREAKFAST 15 tablet 2  . Metoprolol Tartrate 75 MG TABS TAKE 1 TABLET BY MOUTH TWICE DAILY. 60 tablet 2  . montelukast (SINGULAIR) 10 MG tablet TAKE 1 TABLET BY MOUTH AT BEDTIME. 30 tablet 2  . Multiple Vitamin (MULTIVITAMIN WITH MINERALS) TABS tablet Take 1 tablet by mouth daily. Alive Women's Multivitamin    . Omega-3 Fatty Acids (FISH OIL) 1000 MG CAPS Take 1,000 mg by mouth every evening.    Marland Kitchen OVER THE COUNTER MEDICATION Take 1 capsule by mouth daily. BEET ROOT    . polyethylene glycol-electrolytes (TRILYTE) 420 g solution Take 4,000 mLs by mouth as directed. 4000 mL 0  . PREMARIN vaginal cream Place 1 Applicatorful vaginally at bedtime.   3  . PROVENTIL HFA 108 (90 Base) MCG/ACT inhaler INHALE 2 PUFFS INTO THE LUNGS EVERY 6 HOURS AS NEEDED FOR WHEEZING OR SHORTNESS OF BREATH. (Patient taking differently: Inhale 2 puffs into the lungs every 6 (six) hours as needed for wheezing or shortness of breath. ) 6.7 g 2  .  RABEprazole (ACIPHEX) 20 MG tablet Take 1 tablet (20 mg total) by mouth 2 (two) times daily with a meal. (Patient not taking: Reported on 01/09/2019) 180 tablet 3  . sodium chloride (OCEAN) 0.65 % SOLN nasal spray Place 2 sprays into both nostrils every 4 (four) hours as needed for congestion.    Marland Kitchen spironolactone (ALDACTONE) 25 MG tablet TAKE 1 TABLET (25 MG TOTAL) BY MOUTH DAILY. 30 tablet 2  . vitamin B-12 (CYANOCOBALAMIN) 1000 MCG tablet Take 1,000 mcg by mouth daily.     No current facility-administered medications on file prior to visit.     Observations/Objective: No direct observation done as this was a telephone encounter.  Assessment and  Plan: 1. Cervical radiculopathy We will get an MRI of the C-spine.  Further management will be based on results.  She will follow-up with her pain specialist for refill on hydrocodone - MR Cervical Spine Wo Contrast; Future  2. Prediabetes 3. Morbid obesity due to excess calories [facility]) -Discussed the importance of healthy eating habits.  Encouraged her to try to stay active.  She will resume follow-up with medical weight management program next month. - glucose blood (ONETOUCH ULTRA) test strip; Use as instructed  Dispense: 100 each; Refill: 12 - Hemoglobin A1c; Future - Lipid panel; Future  4. Essential hypertension Reported home blood pressure readings at goal. She will continue current medications that include hydralazine, amlodipine, spironolactone, metoprolol, and furosemide  5. Hypothyroidism, unspecified type Continue levothyroxine - TSH; Future   Follow Up Instructions: Follow-up in 3 months   I discussed the assessment and treatment plan with the patient. The patient was provided an opportunity to ask questions and all were answered. The patient agreed with the plan and demonstrated an understanding of the instructions.   The patient was advised to call back or seek an in-person evaluation if the symptoms worsen or if the  condition fails to improve as anticipated.  I provided 20 minutes of non-face-to-face time during this encounter.   Karle Plumber, MD

## 2019-03-20 ENCOUNTER — Encounter (HOSPITAL_COMMUNITY)
Admission: RE | Admit: 2019-03-20 | Discharge: 2019-03-20 | Disposition: A | Discharge status: Home / Self Care | Payer: Medicaid Other | Source: Ambulatory Visit | Attending: Internal Medicine | Admitting: Internal Medicine

## 2019-03-20 ENCOUNTER — Encounter: Payer: Self-pay | Admitting: *Deleted

## 2019-03-20 ENCOUNTER — Telehealth: Payer: Self-pay

## 2019-03-20 ENCOUNTER — Telehealth: Payer: Self-pay | Admitting: *Deleted

## 2019-03-20 NOTE — Telephone Encounter (Signed)
VM received, pt wants to r/s her procedure due to the Covid-19 test. She has plans that weekend and can't quarantine.

## 2019-03-20 NOTE — Telephone Encounter (Signed)
Went on the [employer] and did prior auth for MRI   CPT- [id number] MRI cervical spine wo contrast  ICD- M54.12 cervical radiculopathy  Authorization #[id number] Effective-03/20/19 Expires-09/16/19  Contacted the scheduling department and spoke with Elberta Fortis to schedule MRI  MRI is scheduled for March 29, 2019 at 12pm at [facility] Pt is to arrive at 1145am   Contacted pt to go over MRI appointment pt doesn't have any questions or concerns

## 2019-03-20 NOTE — Telephone Encounter (Signed)
Spoke with patient. She states she called and left 2 messages in endo that this appt needed to be r/s'd. She will call endo back to get this rescheduled.

## 2019-03-20 NOTE — Telephone Encounter (Signed)
Received VM from [facility] in Endo. They called patient to schedule COVID-19 testing and patient stated she needed to cancel procedure scheduled for 6/22 as she is having a gathering at her house this weekend and she is unable to quarantine after COVID-19 testing. Endo has taken patient off the schedule. They advised patient to call us.   Called spoke with patient. She is rescheduled to 8/31 at 8:30am. Patient aware I will mail new instructions w/ new pre-op appt (confirmed address). Called endo and LMOVM making aware of reschedule.

## 2019-03-20 NOTE — Telephone Encounter (Signed)
-----   Message from Encarnacion Chu, RN sent at 03/20/2019 11:21 AM EDT ----- Regarding: No show for PAT Good morning Rikki Smestad. Conni did not show for her PAT today. She also has to be covid tested on Friday if she wants to have her procedure done on 03/27/2019. Thank you!

## 2019-03-21 ENCOUNTER — Other Ambulatory Visit: Payer: Self-pay | Admitting: Internal Medicine

## 2019-03-21 NOTE — Telephone Encounter (Signed)
See prior note. This has been taken care of

## 2019-03-23 ENCOUNTER — Other Ambulatory Visit (HOSPITAL_COMMUNITY): Payer: Medicaid Other

## 2019-03-28 DIAGNOSIS — H524 Presbyopia: Secondary | ICD-10-CM | POA: Diagnosis not present

## 2019-03-28 DIAGNOSIS — H5203 Hypermetropia, bilateral: Secondary | ICD-10-CM | POA: Diagnosis not present

## 2019-03-28 DIAGNOSIS — H2513 Age-related nuclear cataract, bilateral: Secondary | ICD-10-CM | POA: Diagnosis not present

## 2019-03-28 DIAGNOSIS — H04123 Dry eye syndrome of bilateral lacrimal glands: Secondary | ICD-10-CM | POA: Diagnosis not present

## 2019-03-28 DIAGNOSIS — E119 Type 2 diabetes mellitus without complications: Secondary | ICD-10-CM | POA: Diagnosis not present

## 2019-03-28 DIAGNOSIS — H1045 Other chronic allergic conjunctivitis: Secondary | ICD-10-CM | POA: Diagnosis not present

## 2019-03-28 DIAGNOSIS — H52223 Regular astigmatism, bilateral: Secondary | ICD-10-CM | POA: Diagnosis not present

## 2019-03-29 ENCOUNTER — Ambulatory Visit (HOSPITAL_COMMUNITY): Admission: RE | Admit: 2019-03-29 | Payer: Medicaid Other | Source: Ambulatory Visit

## 2019-04-04 ENCOUNTER — Encounter: Payer: Self-pay | Admitting: Podiatry

## 2019-04-04 ENCOUNTER — Ambulatory Visit: Payer: Medicaid Other | Admitting: Podiatry

## 2019-04-04 ENCOUNTER — Telehealth: Payer: Self-pay | Admitting: Internal Medicine

## 2019-04-04 ENCOUNTER — Other Ambulatory Visit: Payer: Self-pay

## 2019-04-04 VITALS — Temp 96.9°F

## 2019-04-04 DIAGNOSIS — B351 Tinea unguium: Secondary | ICD-10-CM | POA: Diagnosis not present

## 2019-04-04 DIAGNOSIS — M79675 Pain in left toe(s): Secondary | ICD-10-CM

## 2019-04-04 DIAGNOSIS — M79674 Pain in right toe(s): Secondary | ICD-10-CM

## 2019-04-04 NOTE — Telephone Encounter (Signed)
New Message   Melanie from [facility] pre cert department is calling to get a pre cert done for the pt's MRI on 04/13/2019

## 2019-04-04 NOTE — Patient Instructions (Signed)
Diabetes Mellitus and Foot Care Foot care is an important part of your health, especially when you have diabetes. Diabetes may cause you to have problems because of poor blood flow (circulation) to your feet and legs, which can cause your skin to:  Become thinner and drier.  Break more easily.  Heal more slowly.  Peel and crack. You may also have nerve damage (neuropathy) in your legs and feet, causing decreased feeling in them. This means that you may not notice minor injuries to your feet that could lead to more serious problems. Noticing and addressing any potential problems early is the best way to prevent future foot problems. How to care for your feet Foot hygiene  Wash your feet daily with warm water and mild soap. Do not use hot water. Then, pat your feet and the areas between your toes until they are completely dry. Do not soak your feet as this can dry your skin.  Trim your toenails straight across. Do not dig under them or around the cuticle. File the edges of your nails with an emery board or nail file.  Apply a moisturizing lotion or petroleum jelly to the skin on your feet and to dry, brittle toenails. Use lotion that does not contain alcohol and is unscented. Do not apply lotion between your toes. Shoes and socks  Wear clean socks or stockings every day. Make sure they are not too tight. Do not wear knee-high stockings since they may decrease blood flow to your legs.  Wear shoes that fit properly and have enough cushioning. Always look in your shoes before you put them on to be sure there are no objects inside.  To break in new shoes, wear them for just a few hours a day. This prevents injuries on your feet. Wounds, scrapes, corns, and calluses  Check your feet daily for blisters, cuts, bruises, sores, and redness. If you cannot see the bottom of your feet, use a mirror or ask someone for help.  Do not cut corns or calluses or try to remove them with medicine.  If you  find a minor scrape, cut, or break in the skin on your feet, keep it and the skin around it clean and dry. You may clean these areas with mild soap and water. Do not clean the area with peroxide, alcohol, or iodine.  If you have a wound, scrape, corn, or callus on your foot, look at it several times a day to make sure it is healing and not infected. Check for: ? Redness, swelling, or pain. ? Fluid or blood. ? Warmth. ? Pus or a bad smell. General instructions  Do not cross your legs. This may decrease blood flow to your feet.  Do not use heating pads or hot water bottles on your feet. They may burn your skin. If you have lost feeling in your feet or legs, you may not know this is happening until it is too late.  Protect your feet from hot and cold by wearing shoes, such as at the beach or on hot pavement.  Schedule a complete foot exam at least once a year (annually) or more often if you have foot problems. If you have foot problems, report any cuts, sores, or bruises to your health care provider immediately. Contact a health care provider if:  You have a medical condition that increases your risk of infection and you have any cuts, sores, or bruises on your feet.  You have an injury that is not   healing.  You have redness on your legs or feet.  You feel burning or tingling in your legs or feet.  You have pain or cramps in your legs and feet.  Your legs or feet are numb.  Your feet always feel cold.  You have pain around a toenail. Get help right away if:  You have a wound, scrape, corn, or callus on your foot and: ? You have pain, swelling, or redness that gets worse. ? You have fluid or blood coming from the wound, scrape, corn, or callus. ? Your wound, scrape, corn, or callus feels warm to the touch. ? You have pus or a bad smell coming from the wound, scrape, corn, or callus. ? You have a fever. ? You have a red line going up your leg. Summary  Check your feet every day  for cuts, sores, red spots, swelling, and blisters.  Moisturize feet and legs daily.  Wear shoes that fit properly and have enough cushioning.  If you have foot problems, report any cuts, sores, or bruises to your health care provider immediately.  Schedule a complete foot exam at least once a year (annually) or more often if you have foot problems. This information is not intended to replace advice given to you by your health care provider. Make sure you discuss any questions you have with your health care provider. Document Released: 09/18/2000 Document Revised: 11/03/2017 Document Reviewed: 10/23/2016 Elsevier Patient Education  2020 [employer]  

## 2019-04-08 ENCOUNTER — Encounter: Payer: Self-pay | Admitting: Podiatry

## 2019-04-08 NOTE — Progress Notes (Signed)
Subjective:  Robin [name] presents to clinic today with cc of  painful, thick, discolored, elongated toenails 1-5 b/l that become tender and cannot cut because of thickness. Pain is aggravated when wearing enclosed shoe gear and relieved with periodic professional debridement.  Ladell Pier, MD is her PCP.    Current Outpatient Medications:  .  ACCU-CHEK AVIVA PLUS test strip, USE AS DIRECTED, Disp: 100 each, Rfl: 12 .  acetaminophen (TYLENOL) 500 MG tablet, Take 1,000 mg by mouth 2 (two) times daily. , Disp: , Rfl:  .  amitriptyline (ELAVIL) 50 MG tablet, TAKE 1 TABLET BY MOUTH AT BEDTIME., Disp: 30 tablet, Rfl: 2 .  amLODipine (NORVASC) 5 MG tablet, Take 5 mg by mouth daily. , Disp: , Rfl:  .  aspirin EC 81 MG tablet, Take 81 mg by mouth at bedtime., Disp: , Rfl:  .  buPROPion (WELLBUTRIN XL) 300 MG 24 hr tablet, TAKE 1 TABLET (300 MG TOTAL) BY MOUTH EVERY MORNING., Disp: 90 tablet, Rfl: 0 .  calcium carbonate (TUMS - DOSED IN MG ELEMENTAL CALCIUM) 500 MG chewable tablet, Chew 2 tablets by mouth at bedtime., Disp: , Rfl:  .  celecoxib (CELEBREX) 100 MG capsule, Take 100 mg by mouth 2 (two) times daily., Disp: , Rfl:  .  cetirizine (ZYRTEC) 10 MG tablet, Take 1 tablet (10 mg total) by mouth daily., Disp: 30 tablet, Rfl: 1 .  chlorpheniramine (CHLOR-TRIMETON) 4 MG tablet, Take 4 mg by mouth every 4 (four) hours as needed for allergies. , Disp: , Rfl:  .  CINNAMON PO, Take 1 capsule by mouth daily., Disp: , Rfl:  .  dexlansoprazole (DEXILANT) 60 MG capsule, Take 1 capsule (60 mg total) by mouth daily., Disp: 30 capsule, Rfl: 3 .  diclofenac sodium (VOLTAREN) 1 % GEL, Apply 2-4 g topically 4 (four) times daily as needed (PAIN.)., Disp: , Rfl:  .  DULoxetine (CYMBALTA) 30 MG capsule, Take 1 capsule (30 mg total) by mouth daily. (Patient taking differently: Take 30 mg by mouth at bedtime. ), Disp: 30 capsule, Rfl: 6 .  fexofenadine (ALLEGRA) 180 MG tablet, Take 180 mg by mouth daily as  needed for allergies or rhinitis., Disp: , Rfl:  .  fluticasone (FLONASE) 50 MCG/ACT nasal spray, Place 1 spray into both nostrils daily., Disp: 16 g, Rfl: 1 .  furosemide (LASIX) 40 MG tablet, TAKE 1/2 TO 1 TABLET BY MOUTH DAILY FOR LOWER EXTREMITY SWELLING, Disp: 30 tablet, Rfl: 0 .  gabapentin (NEURONTIN) 300 MG capsule, TAKE 1 CAPSULE (300 MG TOTAL) BY MOUTH 4 (FOUR) TIMES DAILY., Disp: 120 capsule, Rfl: 0 .  glucose blood (ONETOUCH ULTRA) test strip, Use as instructed, Disp: 100 each, Rfl: 12 .  glucose blood test strip, Accu-Chek Aviva Plus test strips, Disp: , Rfl:  .  hydrALAZINE (APRESOLINE) 50 MG tablet, Take 2 tablets (100 mg total) by mouth 3 (three) times daily., Disp: 180 tablet, Rfl: 5 .  HYDROcodone-acetaminophen (NORCO/VICODIN) 5-325 MG tablet, Take 1 tablet by mouth 2 (two) times daily. , Disp: , Rfl:  .  levothyroxine (SYNTHROID) 88 MCG tablet, TAKE 1 TABLET BY MOUTH DAILY., Disp: 30 tablet, Rfl: 2 .  LINZESS 290 MCG CAPS capsule, TAKE 1 CAPSULE BY MOUTH DAILY BEFORE BREAKFAST., Disp: 30 capsule, Rfl: 5 .  metFORMIN (GLUCOPHAGE) 500 MG tablet, TAKE 1/2 TABLET DAILY WITH BREAKFAST, Disp: 15 tablet, Rfl: 2 .  Metoprolol Tartrate 75 MG TABS, TAKE 1 TABLET BY MOUTH TWICE DAILY., Disp: 60 tablet, Rfl: 2 .  montelukast (SINGULAIR) 10 MG tablet, TAKE 1 TABLET BY MOUTH AT BEDTIME., Disp: 30 tablet, Rfl: 2 .  Multiple Vitamin (MULTIVITAMIN WITH MINERALS) TABS tablet, Take 1 tablet by mouth daily. Alive Women's Multivitamin, Disp: , Rfl:  .  Omega-3 Fatty Acids (FISH OIL) 1000 MG CAPS, Take 1,000 mg by mouth every evening., Disp: , Rfl:  .  OVER THE COUNTER MEDICATION, Take 1 capsule by mouth daily. BEET ROOT, Disp: , Rfl:  .  polyethylene glycol-electrolytes (TRILYTE) 420 g solution, Take 4,000 mLs by mouth as directed., Disp: 4000 mL, Rfl: 0 .  PREMARIN vaginal cream, Place 1 Applicatorful vaginally at bedtime. , Disp: , Rfl: 3 .  PROVENTIL HFA 108 (90 Base) MCG/ACT inhaler, INHALE 2  PUFFS INTO THE LUNGS EVERY 6 HOURS AS NEEDED FOR WHEEZING OR SHORTNESS OF BREATH. (Patient taking differently: Inhale 2 puffs into the lungs every 6 (six) hours as needed for wheezing or shortness of breath. ), Disp: 6.7 g, Rfl: 2 .  RABEprazole (ACIPHEX) 20 MG tablet, Take 1 tablet (20 mg total) by mouth 2 (two) times daily with a meal., Disp: 180 tablet, Rfl: 3 .  sodium chloride (OCEAN) 0.65 % SOLN nasal spray, Place 2 sprays into both nostrils every 4 (four) hours as needed for congestion., Disp: , Rfl:  .  vitamin B-12 (CYANOCOBALAMIN) 1000 MCG tablet, Take 1,000 mcg by mouth daily., Disp: , Rfl:  .  spironolactone (ALDACTONE) 25 MG tablet, TAKE 1 TABLET (25 MG TOTAL) BY MOUTH DAILY., Disp: 30 tablet, Rfl: 2   Allergies  Allergen Reactions  . Celexa [Citalopram Hydrobromide] Other (See Comments)    "Made me feel out of it"  . Gabapentin     Contributed to lower extremity edema  . Norvasc [Amlodipine Besylate]     Edema in lower extremities  . Diflucan [Fluconazole] Rash     Objective: Vitals:   04/04/19 1032  Temp: (!) 96.9 F (36.1 C)    Physical Examination:  Vascular Examination: Capillary refill time immediate x 10 digits.  Palpable DP/PT pulses b/l.  Digital hair absent b/l.  No edema noted b/l.  Skin temperature gradient WNL b/l.  Dermatological Examination: Skin with normal turgor, texture and tone b/l.  No open wounds b/l.  No interdigital macerations noted b/l.  Elongated, thick, discolored brittle toenails with subungual debris and pain on dorsal palpation of nailbeds 1-5 b/l.  Musculoskeletal Examination: Muscle strength 5/5 to all muscle groups b/l  No pain, crepitus or joint discomfort with active/passive ROM.  Neurological Examination: Sensation absent b/l with 10 gram monofilament.  Assessment: Mycotic nail infection with pain 1-5 b/l  Plan: 1. Toenails 1-5 b/l were debrided in length and girth without iatrogenic laceration. 2.  Continue  soft, supportive shoe gear daily. 3.  Report any pedal injuries to medical professional. 4.  Follow up 3 months. 5.  Patient/POA to call should there be a question/concern in there interim.

## 2019-04-10 ENCOUNTER — Other Ambulatory Visit: Payer: Self-pay

## 2019-04-10 ENCOUNTER — Ambulatory Visit: Payer: Medicaid Other | Attending: Internal Medicine

## 2019-04-10 ENCOUNTER — Other Ambulatory Visit: Payer: Self-pay | Admitting: Cardiovascular Disease

## 2019-04-10 ENCOUNTER — Other Ambulatory Visit: Payer: Self-pay | Admitting: Internal Medicine

## 2019-04-10 ENCOUNTER — Telehealth: Payer: Self-pay | Admitting: Internal Medicine

## 2019-04-10 DIAGNOSIS — E039 Hypothyroidism, unspecified: Secondary | ICD-10-CM

## 2019-04-10 DIAGNOSIS — R7303 Prediabetes: Secondary | ICD-10-CM

## 2019-04-10 DIAGNOSIS — I10 Essential (primary) hypertension: Secondary | ICD-10-CM

## 2019-04-10 MED FILL — LEVOTHYROXINE 88 MCG TABLET: 88 | 30 days supply | Qty: 30 | Fill #1

## 2019-04-10 MED FILL — SPIRONOLACTONE 25 MG TABLET: 25 | 30 days supply | Qty: 30 | Fill #0

## 2019-04-10 NOTE — Telephone Encounter (Signed)
Robin [name] called stating that the appt was schedule for a MRI brain with/without contrast and the code provided was for without contrast. Please follow up.

## 2019-04-11 LAB — LIPID PANEL
Chol/HDL Ratio: 3.3 ratio (ref 0.0–4.4)
Cholesterol, Total: 197 mg/dL (ref 100–199)
HDL: 60 mg/dL (ref >39)
LDL Calculated: 114 mg/dL — ABNORMAL HIGH (ref 0–99)
Triglycerides: 116 mg/dL (ref 0–149)
VLDL Cholesterol Cal: 23 mg/dL (ref 5–40)

## 2019-04-11 LAB — HEMOGLOBIN A1C
Est. average glucose Bld gHb Est-mCnc: 123 mg/dL
Hgb A1c MFr Bld: 5.9 % — ABNORMAL HIGH (ref 4.8–5.6)

## 2019-04-11 LAB — TSH: TSH: 2.49 u[IU]/mL (ref 0.450–4.500)

## 2019-04-11 MED FILL — GABAPENTIN 300 MG CAPSULE: 300 | 30 days supply | Qty: 120 | Fill #0

## 2019-04-11 MED FILL — FUROSEMIDE 40 MG TAB: 40 | 90 days supply | Qty: 90 | Fill #0

## 2019-04-11 MED FILL — CELECOXIB 100 MG CAP: 100 | 60 days supply | Qty: 60 | Fill #0

## 2019-04-11 NOTE — Telephone Encounter (Signed)
Has been updated and PRECERT is aware

## 2019-04-11 NOTE — Telephone Encounter (Signed)
MA changed order for CPT code.

## 2019-04-12 ENCOUNTER — Other Ambulatory Visit: Payer: Self-pay | Admitting: Internal Medicine

## 2019-04-12 ENCOUNTER — Telehealth: Payer: Self-pay | Admitting: *Deleted

## 2019-04-12 MED ORDER — ATORVASTATIN CALCIUM 10 MG PO TABS: 10.0000 mg | ORAL_TABLET | Freq: Every day | ORAL | 3 refills | Status: DC

## 2019-04-12 MED FILL — ATORVASTATIN 10 MG TABLET: 10 | 30 days supply | Qty: 30 | Fill #0

## 2019-04-12 NOTE — Telephone Encounter (Signed)
-----   Message from Ladell Pier, MD sent at 04/12/2019 10:42 AM EDT ----- Let pt know that thyroid level is nl. Continue current dose of Levothyroxine.  A1C is still in range for pre-DM.  Continue Metformin and healthy eating habits.  LDL cholesterol is 114 with goal being less than 70.  I recommend starting med called Lipitor to help lower cholesterol.  Rxn sent to her pharmacy.  Before starting medication, we need to check liver function tests.  I will ask lab to add this on to the blood tests she had done yesterday.  Please wait to hear back from me with the results of this before starting Lipitor. Information below is for my info only. Thanks. The 10-year ASCVD risk score Mikey Bussing DC Brooke Bonito., et al., 2013) is: 13.4%   Values used to calculate the score:     Age: 81 years     Sex: Female     Is Non-Hispanic African American: Yes     Diabetic: Yes     Tobacco smoker: No     Systolic Blood Pressure: 481 mmHg     Is BP treated: Yes     HDL Cholesterol: 60 mg/dL     Total Cholesterol: 197 mg/dL

## 2019-04-12 NOTE — Telephone Encounter (Signed)
Patient verified DOB Patient is aware of TSH being normal and to continue Levothyroxine. Patient is aware of A1C still being prediabetes and to keep with the metformin and healthy eating. Patient is aware of needing to start Lipitor to address elevated cholesterol level. Patient is aware of waiting for the LFT results prior to beginning the medication.

## 2019-04-13 ENCOUNTER — Other Ambulatory Visit: Payer: Self-pay | Admitting: Internal Medicine

## 2019-04-13 ENCOUNTER — Ambulatory Visit (HOSPITAL_COMMUNITY): Admission: RE | Admit: 2019-04-13 | Payer: Medicaid Other | Source: Ambulatory Visit

## 2019-04-13 DIAGNOSIS — E785 Hyperlipidemia, unspecified: Secondary | ICD-10-CM

## 2019-04-14 ENCOUNTER — Telehealth: Payer: Self-pay | Admitting: *Deleted

## 2019-04-14 DIAGNOSIS — F54 Psychological and behavioral factors associated with disorders or diseases classified elsewhere: Secondary | ICD-10-CM | POA: Diagnosis not present

## 2019-04-14 DIAGNOSIS — Z7189 Other specified counseling: Secondary | ICD-10-CM | POA: Diagnosis not present

## 2019-04-14 DIAGNOSIS — F331 Major depressive disorder, recurrent, moderate: Secondary | ICD-10-CM | POA: Diagnosis not present

## 2019-04-14 NOTE — Telephone Encounter (Signed)
Patient verified DOB Patient is aware of liver test being normal and she can start the Lipitor. Patient advised to continue all other medications as prescribed.

## 2019-04-14 NOTE — Telephone Encounter (Signed)
-----   Message from Robin Pier, MD sent at 04/14/2019  7:50 AM EDT ----- Let patient know that her liver function tests are normal.  She can start taking cholesterol-lowering medication.

## 2019-04-15 LAB — HEPATIC FUNCTION PANEL
ALT: 19 IU/L (ref 0–32)
AST: 20 IU/L (ref 0–40)
Albumin: 4.2 g/dL (ref 3.8–4.9)
Alkaline Phosphatase: 79 IU/L (ref 39–117)
Bilirubin Total: <0.2 mg/dL (ref 0.0–1.2)
Bilirubin, Direct: 0.06 mg/dL (ref 0.00–0.40)
Total Protein: 7 g/dL (ref 6.0–8.5)

## 2019-04-15 LAB — SPECIMEN STATUS REPORT

## 2019-04-17 ENCOUNTER — Other Ambulatory Visit: Payer: Self-pay | Admitting: Internal Medicine

## 2019-04-17 ENCOUNTER — Telehealth: Payer: Self-pay | Admitting: Internal Medicine

## 2019-04-17 DIAGNOSIS — J309 Allergic rhinitis, unspecified: Secondary | ICD-10-CM

## 2019-04-17 MED FILL — AMITRIPTYLINE HCL 50 MG TAB: 50 | 30 days supply | Qty: 30 | Fill #2

## 2019-04-17 NOTE — Telephone Encounter (Signed)
Pt would like to get a referral for a nutritionist..please follow up

## 2019-04-17 NOTE — Telephone Encounter (Signed)
Could you please contact pt and see why she needing to see a nutritionist

## 2019-04-18 MED FILL — MONTELUKAST SOD 10 MG TAB: 10 | 30 days supply | Qty: 30 | Fill #0

## 2019-04-18 MED FILL — METOPROLOL TARTRATE 75 MG T: 75 | 30 days supply | Qty: 60 | Fill #0

## 2019-04-18 NOTE — Telephone Encounter (Signed)
Pt has been been going to [facility], and she was told she has to be referred if not she has to pay out of pocket

## 2019-04-18 NOTE — Telephone Encounter (Signed)
Will forward to pcp

## 2019-04-19 DIAGNOSIS — M539 Dorsopathy, unspecified: Secondary | ICD-10-CM | POA: Diagnosis not present

## 2019-04-19 DIAGNOSIS — M8949 Other hypertrophic osteoarthropathy, multiple sites: Secondary | ICD-10-CM | POA: Diagnosis not present

## 2019-04-19 DIAGNOSIS — M791 Myalgia, unspecified site: Secondary | ICD-10-CM | POA: Diagnosis not present

## 2019-04-19 MED FILL — DEXILANT DR 60 MG CAPSULE: 60 | 30 days supply | Qty: 30 | Fill #3

## 2019-04-19 NOTE — Telephone Encounter (Signed)
Noted  

## 2019-04-21 DIAGNOSIS — F54 Psychological and behavioral factors associated with disorders or diseases classified elsewhere: Secondary | ICD-10-CM | POA: Diagnosis not present

## 2019-04-21 DIAGNOSIS — F331 Major depressive disorder, recurrent, moderate: Secondary | ICD-10-CM | POA: Diagnosis not present

## 2019-04-26 ENCOUNTER — Ambulatory Visit (HOSPITAL_COMMUNITY)
Admission: RE | Admit: 2019-04-26 | Discharge: 2019-04-26 | Disposition: A | Discharge status: Home / Self Care | Payer: Medicaid Other | Source: Ambulatory Visit | Attending: Internal Medicine | Admitting: Internal Medicine

## 2019-04-26 DIAGNOSIS — D352 Benign neoplasm of pituitary gland: Secondary | ICD-10-CM | POA: Diagnosis not present

## 2019-04-26 DIAGNOSIS — M5412 Radiculopathy, cervical region: Secondary | ICD-10-CM

## 2019-04-26 DIAGNOSIS — M542 Cervicalgia: Secondary | ICD-10-CM | POA: Diagnosis not present

## 2019-04-26 LAB — POCT I-STAT CREATININE: Creatinine, Ser: 0.9 mg/dL (ref 0.44–1.00)

## 2019-04-26 MED ORDER — GADOBUTROL 1 MMOL/ML IV SOLN
8.0000 mL | Freq: Once | INTRAVENOUS | Status: AC | PRN
  Administered 2019-04-26: 8 mL via INTRAVENOUS

## 2019-04-27 ENCOUNTER — Telehealth: Payer: Self-pay

## 2019-04-27 MED FILL — MONTELUKAST SOD 10 MG TAB: 10 | 30 days supply | Qty: 30 | Fill #0

## 2019-04-27 MED FILL — METOPROLOL TARTRATE 75 MG T: 75 | 30 days supply | Qty: 60 | Fill #0

## 2019-04-27 NOTE — Telephone Encounter (Signed)
Contacted pt to go over MRI results pt is aware   Dr. Margarita Rana pt is wanting to know if there is something that can be done regarding her neck besides following up with pain clinic and Dr. Wynetta Emery

## 2019-04-28 NOTE — Telephone Encounter (Signed)
Pt returned call and Alinda Sierras provided pt the information. Pt scheduled an appointment for 8/21 at 350

## 2019-04-28 NOTE — Telephone Encounter (Signed)
Continue prescriptions prescribed by the pain clinic and schedule appointment with PCP to discuss further management.

## 2019-04-28 NOTE — Telephone Encounter (Signed)
Contacted pt to go over Dr. Margarita Rana response pt didn't answer. Was unable to lvm due to vm being full

## 2019-05-04 ENCOUNTER — Telehealth: Payer: Self-pay | Admitting: Internal Medicine

## 2019-05-04 NOTE — Telephone Encounter (Signed)
LMTCB x 1 

## 2019-05-08 MED FILL — LEVOTHYROXINE 88 MCG TABLET: 88 | 30 days supply | Qty: 30 | Fill #2

## 2019-05-08 MED FILL — hydrALAZINE HCL 50 MG TABS: 50 | 60 days supply | Qty: 180 | Fill #2

## 2019-05-08 MED FILL — SPIRONOLACTONE 25 MG TABLET: 25 | 60 days supply | Qty: 60 | Fill #1

## 2019-05-08 NOTE — Telephone Encounter (Signed)
Called spoke with patient and offered appt with MW.  Patient unable to come today or tomorrow 8/4.  Patient prefers to see MW himself.  Appt scheduled for Thursday 8/6 @ 1445.  Patient okay to wait until then and is aware to bring all medications and inhalers with her to the appt.  Nothing further needed at this time; will sign off.

## 2019-05-08 NOTE — Telephone Encounter (Signed)
Called and spoke with pt who stated she has been having problems with cough and SOB x2 months but states that it has progressively become worse.  Pt said when she clears her throat, she is able to get up yellow phlegm.  Pt denies any complaints of fever. Pt does have complaints of wheezing and some chest tightness.  Pt has had to use rescue inhaler at least twice daily and states sometimes she does get relief after using it. Pt is taking zyrtec, allegra, and all other meds as prescribed.  Pt is requesting something to be prescribed to help with her symptoms. Dr. Melvyn Novas, please advise on this for pt. Thanks!  Patient Instructions by Tanda Rockers, MD at 07/19/2018 10:30 AM Author: Tanda Rockers, MD Author Type: Physician Filed: 07/19/2018 11:26 AM  Note Status: Signed Cosign: Cosign Not Required Encounter Date: 07/19/2018  Editor: Tanda Rockers, MD (Physician)    Add back gabapentin 300 mg four times a day as per med calendar  Change tessilon to 200 mg up to every 8 hours as needed for cough  and stop delsym  If can't stop coughing then add hydrocodone up to every 4 hours if needed    If not better next step is to return to [facility]) then if not satisfied please return here  with all medications /inhalers/ solutions in hand so we can verify exactly what you are taking. This includes all medications from all doctors and over the [place] separate them into two bags:  the ones you take automatically, no matter what, vs the ones you take just when you feel you need them "BAG #2 is UP TO YOU"  - this will really help [name] help you take your medications more effectively.

## 2019-05-08 NOTE — Telephone Encounter (Signed)
Ov asap with all meds/ inhalers in hand, me or np

## 2019-05-09 MED FILL — CELECOXIB 100 MG CAPSULE: 100 | 30 days supply | Qty: 60 | Fill #0

## 2019-05-10 DIAGNOSIS — Z6841 Body Mass Index (BMI) 40.0 and over, adult: Secondary | ICD-10-CM | POA: Diagnosis not present

## 2019-05-10 DIAGNOSIS — R7303 Prediabetes: Secondary | ICD-10-CM | POA: Diagnosis not present

## 2019-05-10 DIAGNOSIS — I10 Essential (primary) hypertension: Secondary | ICD-10-CM | POA: Diagnosis not present

## 2019-05-11 ENCOUNTER — Encounter: Payer: Self-pay | Admitting: Internal Medicine

## 2019-05-11 ENCOUNTER — Other Ambulatory Visit: Payer: Self-pay

## 2019-05-11 ENCOUNTER — Ambulatory Visit (INDEPENDENT_AMBULATORY_CARE_PROVIDER_SITE_OTHER): Payer: Medicaid Other | Admitting: Internal Medicine

## 2019-05-11 DIAGNOSIS — R0609 Other forms of dyspnea: Secondary | ICD-10-CM | POA: Diagnosis not present

## 2019-05-11 DIAGNOSIS — R05 Cough: Secondary | ICD-10-CM | POA: Diagnosis not present

## 2019-05-11 DIAGNOSIS — R058 Other specified cough: Secondary | ICD-10-CM

## 2019-05-11 MED ORDER — PREDNISONE 10 MG PO TABS: ORAL_TABLET | ORAL | 0 refills | Status: DC

## 2019-05-11 MED FILL — predniSONE 10 MG TABS: 10 | 6 days supply | Qty: 14 | Fill #0

## 2019-05-11 NOTE — Progress Notes (Signed)
Subjective:     Patient ID: Robin [name], female   DOB: September 21, 1956       MRN: [id number]     Brief patient profile:  64  yobf quit smoking in 1994 seen previously for dyspnea, chronic allergic rhinitis and hemoptysis (felt secondary to erosions from tonsils status post bronchoscopy) - has sensation of globus/ dysphagia dating back at least to 2009   Has seen Dr [name] for pos allergies but does not recall details (even symptoms)      TESTS  PFT 02/09/17: FVC  2.35 L (77%) FEV1 2.06 L (85%) FEV1/FVC 0.88 FEF 25-75 3.32 L (142%) negative bronchodilator response TLC 3.74 L (68%) RV 71% ERV 28% DLCO corrected 71% 08/22/15: FVC 2.32 L (119%) FEV1 1.99 L (121%) FEV1/FVC 0.85 FEF 25-75 2.59 L (118%)  6MWT 02/16/17:  Walked 126 meters / Baseline Sat 99%  On RA / Nadir Sat 99% on RA  METHACHOLINE CHALLENGE TEST (02/24/17):  Normal bronchial hyperreactivity.  IMAGING BARIUM SWALLOW/ESOPHAGRAM 04/30/17 (per radiologist):  Hiatal hernia with mild reflux. Esophageal dysmotility  CT NECK/SOFT TISSUE W/ CONTRAST 04/19/17 (per radiologist): No inflammatory change or foreign body can be seen in the pharynx.  Cervical spondylosis. 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  CT CHEST W/O 01/07/17   No parenchymal nodule, mass, or opacity appreciated. No pleural effusion or thickening. No pericardial effusion. No pathologic mediastinal adenopathy.  CTA CHEST 12/13/15   No pulmonary emboli. No pleural effusion or thickening. No pericardial effusion. No pathologic mediastinal adenopathy. No parenchymal nodule, mass, or opacification.  CARDIAC TTE (09/29/16): LV normal in size with moderate concentric hypertrophy. EF 55-60% with no regional wall motion abnormalities. Indeterminant diastolic function. LA & RA normal in size. RV normal in size and function. No aortic stenosis or regurgitation. Aortic root normal in size. No mitral stenosis or regurgitation. No  significant pulmonic regurgitation. No significant tricuspid regurgitation. No pericardial effusion.  LEFT HEART CATHETERIZATION (11/13/3):  Aortic systolic pressure 325  Diastolic pressure 90  Left ventricular systolic pressure 498  End-diastolic pressure 27 left main coronary artery normal  Left anterior descending artery normal  Left circumflex coronary artery normal  Right coronary artery dominant and normal   Overall estimated ejection fraction greater than 60% without focal wall motion abnormality  MICROBIOLOGY Endobronchial/Endotracheal Brushing 03/08/17: Rare Haemophilus influenza beta lactamase positive  PATHOLOGY Endobronchial/Endotracheal Brushing 03/08/17: No malignancy. Negative for herpes & HPV.  LABS 05/28/17 BNP:  6.1 ESR:  34 TSH:  1.060 Anti-CCP:  6 RF:  <10  02/16/17 IgE: 81 RAST panel: Cockroach 0.2 / D. farinae 0.2 / D. pteronyssinus 0.24   12/23/16 INR: 1.0 PTT: 24.1 Chromatin Ab:  <0.2 Smith Ab: <0.2 DS DNA Ab:  <1 SSA:  <0.2 SSB:  <0.2 Anti-CCP:  <16 SCL-70:  <0.2 ENA RNP Ab: Centromere Ab Screen:  <0.2 Jo-1 Ab:  <0.2  05/12/16 ANA: Negative Rheumatoid factor:  <10     02/08/2018 acute extended ov/Akito Boomhower re:  acute on chronic cough x decades / establish with me Robin [name] pt) Chief Complaint  Patient presents with  . Acute Visit    Pt c/o cough with yellow, wheezing and SOB x 3 days. She states her chest feels sore when she coughs. She has been using her albuterol inahler 3 x daily on average.   baseline = maint protonix right before first meal then before supper, and singuliar and needing saba  3 x  Times a day but not noct then severe  cough /subj  wheeze/ chest soreness 3 days prior to OV and sob with any activity  rec Prednisone 10 mg take  4 each am x 2 days,   2 each am x 2 days,  1 each am x 2 days and stop  zpak Protonix 40  Mg Take 30- 60 min before your first and last meals of the day  For drainage / throat tickle try take  CHLORPHENIRAMINE  4 mg - take one every 4 hours as needed   Take delsym two tsp every 12 hours and supplement if needed with  tramadol 50 mg up to 2 every 4 hours GERD  Diet   Please schedule a follow up office visit in 4 weeks, sooner if needed  with all medications /inhalers/ solutions in hand so we can verify exactly what you are taking. This includes all medications from all doctors and over the counters      03/08/2018  f/u ov/ re:  Cough x decades / did not bring all meds but most of them  Chief Complaint  Patient presents with  . Follow-up    Breathing is unchanged. She is still coughing up yellow sputum. She is using her albuterol inhaler 3 x per wk on average.   Dyspnea:  Even if not coughing doe x room to room  X  Years = MMRC3 = can't walk 100 yards even at a slow pace at a flat grade s stopping due to sob  = stops a lot to shop  Cough: fits of cough are  Sporadic daytime mostly  Sleep: she says fine, husband hears wheezing  SABA use:  Doesn't really help/ note MCT neg  rec Stop corevidol and losartan and instead try bisoprolol 5 mg twice daily  For cough try tessalon 200 mg every 8 hours as needed  For drainage / throat tickle try take CHLORPHENIRAMINE  4 mg - take one every 4 hours as needed   Please see patient coordinator before you leave today  to schedule sinus CT> never done denied by insurance          07/19/2018  Acute extended  ov/ re: cough / dysphagia/ globus > 10 y worse since off gabapentin 300 mg x 4 weeks with worse doe also / did not bring updated med calendar as requested  Chief Complaint  Patient presents with  . Acute Visit    Increased SOB and cough x 3-4 wks. Her cough is occ prod with green to yellow sputum.  She also c/o hoarseness.   Dyspnea:   MMRC3 = can't walk 100 yards even at a slow pace at a flat grade s stopping due to sob  - can't do  food lion Cough:  sporadic Sleeping: sleeps fine bed flat/ 2 pillows  SABA use: none Says  tessalon works the best for cough rec Add back gabapentin 300 mg four times a day as per med calendar Change tessilon to 200 mg up to every 8 hours as needed for cough  and stop delsym If can't stop coughing then add hydrocodone up to every 4 hours if needed  If not better next step is to return to ENT ([place]) then if not satisfied please return here  with all medications /inhalers/ solutions     05/11/2019  f/u ov/ re: uacs / MO with doe /brought meds but they don't correlate well with med list  Chief Complaint  Patient presents with  . Follow-up    Patient reports that   her sob has increased and she still has a dry cough.   Dyspnea:  Was able to walk at [place], now using scooter x 2 month Cough: still severe cough day > noct, dry > wet with globus sensatin and hoarseness but still subj "wheezeling"  Sleeping: sleeps ok  in flat bed, several pillows SABA use: no better with saba  02: none  Overt hb despite dexilant / no longer on aciphex though still listed   No obvious day to day or daytime variability or assoc excess/ purulent sputum or mucus plugs or hemoptysis or cp or chest tightness.  Sleeping  without nocturnal  or early am exacerbation  of respiratory  c/o's or need for noct saba. Also denies any obvious fluctuation of symptoms with weather or environmental changes or other aggravating or alleviating factors except as outlined above   No unusual exposure hx or h/o childhood pna/ asthma or knowledge of premature birth.  Current Allergies, Complete Past Medical History, Past Surgical History, Family History, and Social History were reviewed in [employer] record.  ROS  The following are not active complaints unless bolded Hoarseness, sore throat, dysphagia, dental problems, itching, sneezing,  nasal congestion or discharge of excess mucus or purulent secretions, ear ache,   fever, chills, sweats, unintended wt loss or wt gain, classically pleuritic or  exertional cp,  orthopnea pnd or arm/hand swelling  or leg swelling, presyncope, palpitations, abdominal pain, anorexia, nausea, vomiting, diarrhea  or change in bowel habits or change in bladder habits, change in stools or change in urine, dysuria, hematuria,  rash, arthralgias, visual complaints, headache, numbness, weakness or ataxia or problems with walking or coordination,  change in mood or  memory.        Current Meds  - not able to verify all meds on this list         Medication Sig  . ACCU-CHEK AVIVA PLUS test strip USE AS DIRECTED  . acetaminophen (TYLENOL) 500 MG tablet Take 1,000 mg by mouth 2 (two) times daily.   Marland Kitchen amitriptyline (ELAVIL) 50 MG tablet TAKE 1 TABLET BY MOUTH AT BEDTIME.  Marland Kitchen amLODipine (NORVASC) 5 MG tablet Take 5 mg by mouth daily.   Marland Kitchen aspirin EC 81 MG tablet Take 81 mg by mouth at bedtime.  Marland Kitchen atorvastatin (LIPITOR) 10 MG tablet Take 1 tablet (10 mg total) by mouth daily.  Marland Kitchen buPROPion (WELLBUTRIN XL) 300 MG 24 hr tablet TAKE 1 TABLET (300 MG TOTAL) BY MOUTH EVERY MORNING.  . calcium carbonate (TUMS - DOSED IN MG ELEMENTAL CALCIUM) 500 MG chewable tablet Chew 2 tablets by mouth at bedtime.  . celecoxib (CELEBREX) 100 MG capsule Take 100 mg by mouth 2 (two) times daily.  . cetirizine (ZYRTEC) 10 MG tablet Take 1 tablet (10 mg total) by mouth daily.  . chlorpheniramine (CHLOR-TRIMETON) 4 MG tablet Take 4 mg by mouth every 4 (four) hours as needed for allergies.   Marland Kitchen CINNAMON PO Take 1 capsule by mouth daily.  Marland Kitchen dexlansoprazole (DEXILANT) 60 MG capsule Take 1 capsule (60 mg total) by mouth daily.  . diclofenac sodium (VOLTAREN) 1 % GEL Apply 2-4 g topically 4 (four) times daily as needed (PAIN.).  Marland Kitchen DULoxetine (CYMBALTA) 30 MG capsule Take 1 capsule (30 mg total) by mouth daily. (Patient taking differently: Take 30 mg by mouth at bedtime. )  . fexofenadine (ALLEGRA) 180 MG tablet Take 180 mg by mouth daily as needed for allergies or rhinitis.  . fluticasone (FLONASE) 50  MCG/ACT nasal spray Place 1 spray into both nostrils daily.  . furosemide (LASIX) 40 MG tablet TAKE 1/2 TO 1 TABLET BY MOUTH DAILY FOR LOWER EXTREMITY SWELLING  . gabapentin (NEURONTIN) 300 MG capsule TAKE 1 CAPSULE (300 MG TOTAL) BY MOUTH 4 (FOUR) TIMES DAILY.  . glucose blood (ONETOUCH ULTRA) test strip Use as instructed  . glucose blood test strip Accu-Chek Aviva Plus test strips  . hydrALAZINE (APRESOLINE) 50 MG tablet Take 2 tablets (100 mg total) by mouth 3 (three) times daily.  . levothyroxine (SYNTHROID) 88 MCG tablet TAKE 1 TABLET BY MOUTH DAILY.  . LINZESS 290 MCG CAPS capsule TAKE 1 CAPSULE BY MOUTH DAILY BEFORE BREAKFAST.  . metFORMIN (GLUCOPHAGE) 500 MG tablet TAKE 1/2 TABLET DAILY WITH BREAKFAST  . Metoprolol Tartrate 75 MG TABS TAKE 1 TABLET BY MOUTH TWICE DAILY.  . montelukast (SINGULAIR) 10 MG tablet TAKE 1 TABLET BY MOUTH AT BEDTIME.  . Multiple Vitamin (MULTIVITAMIN WITH MINERALS) TABS tablet Take 1 tablet by mouth daily. Alive Women's Multivitamin  . Omega-3 Fatty Acids (FISH OIL) 1000 MG CAPS Take 1,000 mg by mouth every evening.  . OVER THE COUNTER MEDICATION Take 1 capsule by mouth daily. BEET ROOT  . oxyCODONE-acetaminophen (PERCOCET) 10-325 MG tablet Take by mouth.  . polyethylene glycol-electrolytes (TRILYTE) 420 g solution Take 4,000 mLs by mouth as directed.  . PREMARIN vaginal cream Place 1 Applicatorful vaginally at bedtime.   . PROVENTIL HFA 108 (90 Base) MCG/ACT inhaler INHALE 2 PUFFS INTO THE LUNGS EVERY 6 HOURS AS NEEDED FOR WHEEZING OR SHORTNESS OF BREATH. (Patient taking differently: Inhale 2 puffs into the lungs every 6 (six) hours as needed for wheezing or shortness of breath. )  .     . sodium chloride (OCEAN) 0.65 % SOLN nasal spray Place 2 sprays into both nostrils every 4 (four) hours as needed for congestion.  . spironolactone (ALDACTONE) 25 MG tablet TAKE 1 TABLET (25 MG TOTAL) BY MOUTH DAILY.  . vitamin B-12 (CYANOCOBALAMIN) 1000 MCG tablet Take 1,000  mcg by mouth daily.                Objective:   Physical Exam   Obese w/c bound   05/11/2019           331  07/19/2018       328  03/08/2018            337   02/08/18 (!) 333 lb (151 kg)  01/27/18 (!) 328 lb 9.6 oz (149.1 kg)  12/14/17 (!) 326 lb 6.4 oz (148.1 kg)     Vital signs reviewed - Note on arrival 02 sats  98% on RA     HEENT: nl dentition, turbinates bilaterally, and oropharynx. Nl external ear canals without cough reflex   NECK :  without JVD/Nodes/TM/ nl carotid upstrokes bilaterally   LUNGS: no acc muscle use,  Nl contour chest which is clear to A and P bilaterally without cough on insp or exp maneuvers   CV:  RRR  no s3 or murmur or increase in P2, and  Trace bilateral ankle pitting edema R >L   ABD:  Massively obese soft and nontender with poor  inspiratory excursion . No bruits or organomegaly appreciated, bowel sounds nl  MS:  Slow gait with cane  ext warm without deformities, calf tenderness, cyanosis or clubbing No obvious joint restrictions   SKIN: warm and dry without lesions    NEURO:  alert, approp, nl sensorium with  no motor   or cerebellar deficits apparent.         Assessment:       

## 2019-05-11 NOTE — Assessment & Plan Note (Addendum)
Allergy profile 02/16/17  >  Eos 0.1 /  IgE  81 pos dust/ cockroach  - MCT 02/24/17 neg  - DgEs  01/20/18 : Small reducible hiatal hernia with trace gastroesophageal reflux. No evidence of stricture. - Cyclical cough protocol 02/08/2018  - sinus CT 03/10/2018 >>> informed  Insurance decllned> referred back to ENT Erik Obey) [id number]/06/9832 (never went)  - worse off gabapentin 07/19/2018 > restarted 300 qid >no better 05/11/2019  But is not filling her meds on time per bottle she brought with her    - 05/11/2019 rec max 1st gen H1 blockers per guidelines  For pnds and referred back to ENT again  No evience at all for asthma here, suspect this is all Upper airway cough syndrome (previously labeled PNDS),  is so named because it's frequently impossible to sort out how much is  CR/sinusitis with freq throat clearing (which can be related to primary GERD)   vs  causing  secondary (" extra esophageal")  GERD from wide swings in gastric pressure that occur with throat clearing, often  promoting self use of mint and menthol lozenges that reduce the lower esophageal sphincter tone and exacerbate the problem further in a cyclical fashion.   These are the same pts (now being labeled as having "irritable larynx syndrome" by some cough centers) who not infrequently have a history of having failed to tolerate ace inhibitors,  dry powder inhalers or biphosphonates or report having atypical/extraesophageal reflux symptoms that don't respond to standard doses of PPI  and are easily confused as having aecopd or asthma flares by even experienced allergists/ pulmonologists (myself included).   Discussed in detail all the  indications, usual  risks and alternatives  relative to the benefits with patient who agrees to proceed with w/u as outlined above.

## 2019-05-11 NOTE — Patient Instructions (Addendum)
Prednisone 10 mg take  4 each am x 2 days,   2 each am x 2 days,  1 each am x 2 days and stop   GERD (REFLUX)  is an extremely common cause of respiratory symptoms just like yours , many times with no obvious heartburn at all.    It can be treated with medication, but also with lifestyle changes including elevation of the head of your bed (ideally with 6 -8inch blocks under the headboard of your bed),  Smoking cessation, avoidance of late meals, excessive alcohol, and avoid fatty foods, chocolate, peppermint, colas, red wine, and acidic juices such as orange juice.  NO MINT OR MENTHOL PRODUCTS SO NO COUGH DROPS  USE SUGARLESS CANDY INSTEAD (Jolley ranchers or Stover's or Life Savers) or even ice chips will also do - the key is to swallow to prevent all throat clearing. NO OIL BASED VITAMINS - use powdered substitutes.  Avoid fish oil when coughing.  For congestion ok to chlotrabs 4 mg x 2 every hours as needed   We will  schedule ENT eval/ [facility] re your cough  Pulmonary follow up is as needed

## 2019-05-12 ENCOUNTER — Encounter: Payer: Self-pay | Admitting: Internal Medicine

## 2019-05-12 NOTE — Assessment & Plan Note (Addendum)
Body mass index is 50.39 kg/m.  -  trending up again  Lab Results  Component Value Date   TSH 2.490 04/10/2019     Contributing to gerd risk/ doe/reviewed the need and the process to achieve and maintain neg calorie balance > defer f/u primary care including intermittently monitoring thyroid status  I had an extended summary final discussion with the patient  reviewing all relevant studies completed to date and  lasting 15 to 20 minutes of a 25 minute visit  which included directly observing ambulatory 02 saturation study documented in a/p section of  today's  office note.  Each maintenance medication was reviewed in detail including most importantly the difference between maintenance and prns and under what circumstances the prns are to be triggered using an action plan format that is not reflected in the computer generated alphabetically organized AVS.     Please see AVS for specific instructions unique to this visit that I personally wrote and verbalized to the the pt in detail and then reviewed with pt  by my nurse highlighting any changes in therapy recommended at today's visit .      F/u is prn

## 2019-05-12 NOTE — Assessment & Plan Note (Addendum)
[facility]  09/29/16 LVEDP 27  03/08/2018   Walked RA x one lap @ 185 stopped due to  Tired > sob/ slow pace no desat - Spirometry 03/08/2018  FEV1 1.55 (63%)  Ratio 84 with min curvature - try off losartan/ coreg 03/08/2018  > no better as of 07/19/2018  - 07/19/2018   Walked RA x one lap @ 185 stopped due to sob with nl sats, fast pace considering she was using cane. - 05/11/2019   Walked RA x one lap =  approx 250 ft - stopped due to  Sob and back/hip/leg pain but sats still 99% at end    It appears she has diastolic dysfunction due to obesity/ hbp but no pulmonary limitation at present off all pulmonary meds so f/u is prn

## 2019-05-15 ENCOUNTER — Other Ambulatory Visit: Payer: Self-pay | Admitting: Internal Medicine

## 2019-05-15 DIAGNOSIS — I10 Essential (primary) hypertension: Secondary | ICD-10-CM

## 2019-05-15 MED FILL — GABAPENTIN 300 MG CAPSULE: 300 | 30 days supply | Qty: 120 | Fill #1

## 2019-05-15 MED FILL — ATORVASTATIN 10 MG TABLET: 10 | 30 days supply | Qty: 30 | Fill #1

## 2019-05-16 MED FILL — DEXILANT DR 60 MG CAPSULE: 60 | 30 days supply | Qty: 30 | Fill #3

## 2019-05-26 ENCOUNTER — Other Ambulatory Visit: Payer: Self-pay

## 2019-05-26 ENCOUNTER — Ambulatory Visit: Payer: Medicaid Other | Attending: Internal Medicine | Admitting: Internal Medicine

## 2019-05-26 DIAGNOSIS — M5412 Radiculopathy, cervical region: Secondary | ICD-10-CM | POA: Diagnosis not present

## 2019-05-26 DIAGNOSIS — M7989 Other specified soft tissue disorders: Secondary | ICD-10-CM

## 2019-05-26 DIAGNOSIS — D352 Benign neoplasm of pituitary gland: Secondary | ICD-10-CM | POA: Diagnosis not present

## 2019-05-26 DIAGNOSIS — H539 Unspecified visual disturbance: Secondary | ICD-10-CM

## 2019-05-26 DIAGNOSIS — M799 Soft tissue disorder, unspecified: Secondary | ICD-10-CM

## 2019-05-26 NOTE — Progress Notes (Signed)
Pt states she has a rash on her arm  Pt states her neck pain is affecting her eye and ear on the right side

## 2019-05-26 NOTE — Progress Notes (Signed)
Virtual Visit via Telephone Note Due to current restrictions/limitations of in-office visits due to the COVID-19 pandemic, this scheduled clinical appointment was converted to a telehealth visit  I connected with Robin [name] on 05/26/19 at 4:20 p.m by telephone and verified that I am speaking with the correct person using two identifiers. I am in my office.  The patient is at home.  Only the patient and myself participated in this encounter.  I discussed the limitations, risks, security and privacy concerns of performing an evaluation and management service by telephone and the availability of in person appointments. I also discussed with the patient that there may be a patient responsible charge related to this service. The patient expressed understanding and agreed to proceed.   History of Present Illness: Patient with history ofHTN, pre-DM,OSA, fibromyalgia, depression, obesity, GERD, hypothyroid, OA of multiple js (hands, knees, shoulders).  Last evaluated by me 03/17/2019.  Purpose of today's visit is to follow-up on neck pain.  On last visit patient complained of right-sided neck pain with radicular symptoms into the right arm.  MRI of the cervical spine was ordered.  This revealed some degenerative arthritis changes at C4-5 with spinal stenosis with mild cord flattening and by foraminal impingement.  At the C3-4 level she had right greater than left foraminal impingement.  Patient was sent her results via MyChart by Dr. Margarita Rana who was covering for me at the time.  Today she reports that she is still having problems with her neck and pain going down the right arm.  In addition she complains of a feeling of pressure in the right eye and problems with peripheral vision in this eye.  She tells me that this has been going on for months.  She saw an eye doctor about 2-1/2 months ago and states she was told that the eyes were fine.  However she does not think that the symptoms were going on at that  time.  -Other symptoms that she has been experiencing includes numbness at the corner of the right eye, pain and sometimes numbness in the right ear,  and numbness on the posterior right neck.  She feels all of the symptoms are coming from her neck.  Of note patient also had a brain MRI done last month for follow-up on history of pituitary adenoma which was surgically removed about 4 years ago at [facility].  This revealed 10 x 9 x 13 mm residual pituitary adenoma with mild bulging into the superior sella cistern without chiasmatic mass-effect or visible contact.  Looks like the last MRI of the brain that she had done at [facility] was in 2016 and this revealed no evidence of pituitary adenoma.   Patient also complains about a sore bump on 1 of her elbows x3 weeks.  She thinks it may be a reaction to Lipitor which was started around the same time. Observations/Objective:  No direct observation done as this was a telephone encounter  Assessment and Plan: 1. Cervical radiculopathy Recommend referral to neurosurgery for further evaluation.  She is also being followed by pain clinic for pain management - Ambulatory referral to Neurosurgery  2. Pituitary adenoma [facility]) Given that last MRI at [facility] in 2016 revealed no adenoma status post transsphenoidal surgery in 2013 and recent MRI done at [facility] shows residual pituitary adenoma coupled with the fact that she is having some vision changes in the right eye, I think we should refer her back to a neurosurgeon to have a look  at these images - Ambulatory referral to Neurosurgery  3. Vision changes - Ambulatory referral to Neurosurgery - Ambulatory referral to Ophthalmology  4. Soft tissue lesion of elbow region I doubt this is being caused by Lipitor.  We will evaluated when I see her next month   Follow Up Instructions: As previously scheduled for next month.   I discussed the assessment and treatment plan with the patient. The patient was  provided an opportunity to ask questions and all were answered. The patient agreed with the plan and demonstrated an understanding of the instructions.   The patient was advised to call back or seek an in-person evaluation if the symptoms worsen or if the condition fails to improve as anticipated.  I provided 24 minutes of non-face-to-face time during this encounter.   Karle Plumber, MD

## 2019-05-29 DIAGNOSIS — F331 Major depressive disorder, recurrent, moderate: Secondary | ICD-10-CM | POA: Diagnosis not present

## 2019-05-29 DIAGNOSIS — F54 Psychological and behavioral factors associated with disorders or diseases classified elsewhere: Secondary | ICD-10-CM | POA: Diagnosis not present

## 2019-05-29 NOTE — Patient Instructions (Signed)
Robin [name]  05/29/2019     @PREFPERIOPPHARMACY @   Your procedure is scheduled on  06/05/2019.  Report to [facility] at  0700   A.M.  Call this number if you have problems the morning of surgery:  [phone]   Remember:  Follow the diet and prep instructions given to you by Dr Roseanne Kaufman office.                       Take these medicines the morning of surgery with A SIP OF WATER  Amlodipine, wellbutrin, celebrex, dexilant, levothyroxine, metoprolol, oxycodone(if needed), aciphex. Use your inhalers before you come.    Do not wear jewelry, make-up or nail polish.  Do not wear lotions, powders, or perfumes. Please wear deodorant and brush your teeth.  Do not shave 48 hours prior to surgery.  Men may shave face and neck.  Do not bring valuables to the hospital.  [facility] is not responsible for any belongings or valuables.  Contacts, dentures or bridgework may not be worn into surgery.  Leave your suitcase in the car.  After surgery it may be brought to your room.  For patients admitted to the hospital, discharge time will be determined by your treatment team.  Patients discharged the day of surgery will not be allowed to drive home.   Name and phone number of your driver:   family Special instructions:  DO NOT take any medications for diabetes the morning of your procedure.  Please read over the following fact sheets that you were given. Anesthesia Post-op Instructions and Care and Recovery After Surgery       Colonoscopy, Adult, Care After This sheet gives you information about how to care for yourself after your procedure. Your health care provider may also give you more specific instructions. If you have problems or questions, contact your health care provider. What can I expect after the procedure? After the procedure, it is common to have:  A small amount of blood in your stool for 24 hours after the procedure.  Some gas.  Mild abdominal cramping or  bloating. Follow these instructions at home: General instructions  For the first 24 hours after the procedure: ? Do not drive or use machinery. ? Do not sign important documents. ? Do not drink alcohol. ? Do your regular daily activities at a slower pace than normal. ? Eat soft, easy-to-digest foods.  Take over-the-counter or prescription medicines only as told by your health care provider. Relieving cramping and bloating   Try walking around when you have cramps or feel bloated.  Apply heat to your abdomen as told by your health care provider. Use a heat source that your health care provider recommends, such as a moist heat pack or a heating pad. ? Place a towel between your skin and the heat source. ? Leave the heat on for 20-30 minutes. ? Remove the heat if your skin turns bright red. This is especially important if you are unable to feel pain, heat, or cold. You may have a greater risk of getting burned. Eating and drinking   Drink enough fluid to keep your urine pale yellow.  Resume your normal diet as instructed by your health care provider. Avoid heavy or fried foods that are hard to digest.  Avoid drinking alcohol for as long as instructed by your health care provider. Contact a health care provider if:  You have blood in your stool 2-3  days after the procedure. Get help right away if:  You have more than a small spotting of blood in your stool.  You pass large blood clots in your stool.  Your abdomen is swollen.  You have nausea or vomiting.  You have a fever.  You have increasing abdominal pain that is not relieved with medicine. Summary  After the procedure, it is common to have a small amount of blood in your stool. You may also have mild abdominal cramping and bloating.  For the first 24 hours after the procedure, do not drive or use machinery, sign important documents, or drink alcohol.  Contact your health care provider if you have a lot of blood in  your stool, nausea or vomiting, a fever, or increased abdominal pain. This information is not intended to replace advice given to you by your health care provider. Make sure you discuss any questions you have with your health care provider. Document Released: 05/05/2004 Document Revised: 07/14/2017 Document Reviewed: 12/03/2015 Elsevier Patient Education  2020 [place] After These instructions provide you with information about caring for yourself after your procedure. Your health care provider may also give you more specific instructions. Your treatment has been planned according to current medical practices, but problems sometimes occur. Call your health care provider if you have any problems or questions after your procedure. What can I expect after the procedure? After your procedure, you may:  Feel sleepy for several hours.  Feel clumsy and have poor balance for several hours.  Feel forgetful about what happened after the procedure.  Have poor judgment for several hours.  Feel nauseous or vomit.  Have a sore throat if you had a breathing tube during the procedure. Follow these instructions at home: For at least 24 hours after the procedure:      Have a responsible adult stay with you. It is important to have someone help care for you until you are awake and alert.  Rest as needed.  Do not: ? Participate in activities in which you could fall or become injured. ? Drive. ? Use heavy machinery. ? Drink alcohol. ? Take sleeping pills or medicines that cause drowsiness. ? Make important decisions or sign legal documents. ? Take care of children on your own. Eating and drinking  Follow the diet that is recommended by your health care provider.  If you vomit, drink water, juice, or soup when you can drink without vomiting.  Make sure you have little or no nausea before eating solid foods. General instructions  Take over-the-counter and  prescription medicines only as told by your health care provider.  If you have sleep apnea, surgery and certain medicines can increase your risk for breathing problems. Follow instructions from your health care provider about wearing your sleep device: ? Anytime you are sleeping, including during daytime naps. ? While taking prescription pain medicines, sleeping medicines, or medicines that make you drowsy.  If you smoke, do not smoke without supervision.  Keep all follow-up visits as told by your health care provider. This is important. Contact a health care provider if:  You keep feeling nauseous or you keep vomiting.  You feel light-headed.  You develop a rash.  You have a fever. Get help right away if:  You have trouble breathing. Summary  For several hours after your procedure, you may feel sleepy and have poor judgment.  Have a responsible adult stay with you for at least 24 hours or until you  are awake and alert. This information is not intended to replace advice given to you by your health care provider. Make sure you discuss any questions you have with your health care provider. Document Released: 01/12/2016 Document Revised: 12/20/2017 Document Reviewed: 01/12/2016 Elsevier Patient Education  2020 [employer].

## 2019-06-01 ENCOUNTER — Encounter (HOSPITAL_COMMUNITY): Payer: Self-pay

## 2019-06-01 ENCOUNTER — Encounter (HOSPITAL_COMMUNITY)
Admission: RE | Admit: 2019-06-01 | Discharge: 2019-06-01 | Disposition: A | Discharge status: Home / Self Care | Payer: Medicaid Other | Source: Ambulatory Visit | Attending: Internal Medicine | Admitting: Internal Medicine

## 2019-06-01 ENCOUNTER — Other Ambulatory Visit (HOSPITAL_COMMUNITY)
Admission: RE | Admit: 2019-06-01 | Discharge: 2019-06-01 | Disposition: A | Discharge status: Home / Self Care | Payer: Medicaid Other | Source: Ambulatory Visit | Attending: Internal Medicine | Admitting: Internal Medicine

## 2019-06-01 ENCOUNTER — Other Ambulatory Visit: Payer: Self-pay | Admitting: Internal Medicine

## 2019-06-01 ENCOUNTER — Other Ambulatory Visit: Payer: Self-pay

## 2019-06-01 DIAGNOSIS — Z01812 Encounter for preprocedural laboratory examination: Secondary | ICD-10-CM | POA: Diagnosis not present

## 2019-06-01 DIAGNOSIS — Z20828 Contact with and (suspected) exposure to other viral communicable diseases: Secondary | ICD-10-CM | POA: Insufficient documentation

## 2019-06-01 HISTORY — DX: Dyspnea, unspecified: R06.00

## 2019-06-01 LAB — CBC WITH DIFFERENTIAL/PLATELET
Abs Immature Granulocytes: 0.02 10*3/uL (ref 0.00–0.07)
Basophils Absolute: 0 10*3/uL (ref 0.0–0.1)
Basophils Relative: 1 %
Eosinophils Absolute: 0.1 10*3/uL (ref 0.0–0.5)
Eosinophils Relative: 2 %
HCT: 40.5 % (ref 36.0–46.0)
Hemoglobin: 12 g/dL (ref 12.0–15.0)
Immature Granulocytes: 0 %
Lymphocytes Relative: 42 %
Lymphs Abs: 3.3 10*3/uL (ref 0.7–4.0)
MCH: 28.6 pg (ref 26.0–34.0)
MCHC: 29.6 g/dL — ABNORMAL LOW (ref 30.0–36.0)
MCV: 96.4 fL (ref 80.0–100.0)
Monocytes Absolute: 1 10*3/uL (ref 0.1–1.0)
Monocytes Relative: 12 %
Neutro Abs: 3.4 10*3/uL (ref 1.7–7.7)
Neutrophils Relative %: 43 %
Platelets: 310 10*3/uL (ref 150–400)
RBC: 4.2 MIL/uL (ref 3.87–5.11)
RDW: 13.3 % (ref 11.5–15.5)
WBC: 7.9 10*3/uL (ref 4.0–10.5)
nRBC: 0 % (ref 0.0–0.2)

## 2019-06-01 LAB — COMPREHENSIVE METABOLIC PANEL
ALT: 25 U/L (ref 0–44)
AST: 19 U/L (ref 15–41)
Albumin: 3.7 g/dL (ref 3.5–5.0)
Alkaline Phosphatase: 61 U/L (ref 38–126)
Anion gap: 8 (ref 5–15)
BUN: 14 mg/dL (ref 6–20)
CO2: 28 mmol/L (ref 22–32)
Calcium: 9 mg/dL (ref 8.9–10.3)
Chloride: 102 mmol/L (ref 98–111)
Creatinine, Ser: 0.78 mg/dL (ref 0.44–1.00)
GFR calc Af Amer: >60 mL/min (ref >60)
GFR calc non Af Amer: >60 mL/min (ref >60)
Glucose, Bld: 123 mg/dL — ABNORMAL HIGH (ref 70–99)
Potassium: 4.2 mmol/L (ref 3.5–5.1)
Sodium: 138 mmol/L (ref 135–145)
Total Bilirubin: 0.7 mg/dL (ref 0.3–1.2)
Total Protein: 7.3 g/dL (ref 6.5–8.1)

## 2019-06-01 LAB — LIPASE, BLOOD: Lipase: 22 U/L (ref 11–51)

## 2019-06-01 LAB — SARS CORONAVIRUS 2 (TAT 6-24 HRS): SARS Coronavirus 2: NEGATIVE

## 2019-06-01 MED FILL — BUPROPION HCL XL 300 MG TAB: 300 | 30 days supply | Qty: 30 | Fill #0

## 2019-06-01 MED FILL — AMITRIPTYLINE HCL 50 MG TAB: 50 | 90 days supply | Qty: 90 | Fill #0

## 2019-06-01 MED FILL — LEVOTHYROXINE 88 MCG TABLET: 88 | 30 days supply | Qty: 30 | Fill #0

## 2019-06-01 MED FILL — metFORMIN HCL 500 MG TABS: 500 | 30 days supply | Qty: 15 | Fill #1

## 2019-06-05 ENCOUNTER — Encounter (HOSPITAL_COMMUNITY): Payer: Self-pay | Admitting: Anesthesiology

## 2019-06-05 ENCOUNTER — Ambulatory Visit (HOSPITAL_COMMUNITY): Admission: RE | Admit: 2019-06-05 | Payer: Medicaid Other | Source: Home / Self Care | Admitting: Internal Medicine

## 2019-06-05 ENCOUNTER — Encounter (HOSPITAL_COMMUNITY): Admission: RE | Payer: Self-pay | Source: Home / Self Care

## 2019-06-05 ENCOUNTER — Telehealth: Payer: Self-pay | Admitting: *Deleted

## 2019-06-05 SURGERY — COLONOSCOPY WITH PROPOFOL
Anesthesia: Monitor Anesthesia Care

## 2019-06-05 NOTE — Telephone Encounter (Signed)
Pt called in and wants to reschedule her procedure.  She is real congested.  Pt is aware that we will call her back to reschedule it.  [phone]

## 2019-06-05 NOTE — Telephone Encounter (Signed)
Noted  

## 2019-06-05 NOTE — Telephone Encounter (Signed)
LMOVM. Patient needs OV to r/s. Has rescheduled several times. Last OV Jan 2020.

## 2019-06-08 ENCOUNTER — Other Ambulatory Visit: Payer: Self-pay | Admitting: Cardiology

## 2019-06-08 MED FILL — METOPROLOL TARTRATE 75 MG T: 75 | 30 days supply | Qty: 60 | Fill #1

## 2019-06-08 MED FILL — MONTELUKAST SOD 10 MG TAB: 10 | 30 days supply | Qty: 30 | Fill #1

## 2019-06-08 MED FILL — AMLODIPINE BESYLATE 5 MG TA: 5 | 30 days supply | Qty: 30 | Fill #0

## 2019-06-08 MED FILL — CELECOXIB 100 MG CAPSULE: 100 | 30 days supply | Qty: 60 | Fill #1

## 2019-06-13 ENCOUNTER — Other Ambulatory Visit: Payer: Self-pay | Admitting: Gastroenterology

## 2019-06-13 ENCOUNTER — Other Ambulatory Visit: Payer: Self-pay | Admitting: Cardiology

## 2019-06-13 MED FILL — ATORVASTATIN 10 MG TABLET: 10 | 30 days supply | Qty: 30 | Fill #2

## 2019-06-13 MED FILL — LINZESS 290 MCG CAPSULE: 290 | 90 days supply | Qty: 90 | Fill #1

## 2019-06-14 DIAGNOSIS — K219 Gastro-esophageal reflux disease without esophagitis: Secondary | ICD-10-CM | POA: Diagnosis not present

## 2019-06-14 DIAGNOSIS — M2669 Other specified disorders of temporomandibular joint: Secondary | ICD-10-CM | POA: Diagnosis not present

## 2019-06-14 DIAGNOSIS — D352 Benign neoplasm of pituitary gland: Secondary | ICD-10-CM | POA: Diagnosis not present

## 2019-06-14 DIAGNOSIS — Z6841 Body Mass Index (BMI) 40.0 and over, adult: Secondary | ICD-10-CM | POA: Diagnosis not present

## 2019-06-14 DIAGNOSIS — Z87891 Personal history of nicotine dependence: Secondary | ICD-10-CM | POA: Diagnosis not present

## 2019-06-15 DIAGNOSIS — H5213 Myopia, bilateral: Secondary | ICD-10-CM | POA: Diagnosis not present

## 2019-06-15 MED FILL — DEXILANT DR 60 MG CAPSULE: 60 | 30 days supply | Qty: 30 | Fill #0

## 2019-06-16 DIAGNOSIS — G894 Chronic pain syndrome: Secondary | ICD-10-CM | POA: Diagnosis not present

## 2019-06-16 DIAGNOSIS — M79672 Pain in left foot: Secondary | ICD-10-CM | POA: Diagnosis not present

## 2019-06-16 DIAGNOSIS — M25552 Pain in left hip: Secondary | ICD-10-CM | POA: Diagnosis not present

## 2019-06-16 DIAGNOSIS — M25551 Pain in right hip: Secondary | ICD-10-CM | POA: Diagnosis not present

## 2019-06-16 DIAGNOSIS — M25561 Pain in right knee: Secondary | ICD-10-CM | POA: Diagnosis not present

## 2019-06-16 DIAGNOSIS — M79671 Pain in right foot: Secondary | ICD-10-CM | POA: Diagnosis not present

## 2019-06-16 DIAGNOSIS — Z6841 Body Mass Index (BMI) 40.0 and over, adult: Secondary | ICD-10-CM | POA: Diagnosis not present

## 2019-06-16 DIAGNOSIS — E1043 Type 1 diabetes mellitus with diabetic autonomic (poly)neuropathy: Secondary | ICD-10-CM | POA: Diagnosis not present

## 2019-06-16 DIAGNOSIS — M545 Low back pain: Secondary | ICD-10-CM | POA: Diagnosis not present

## 2019-06-16 DIAGNOSIS — M79642 Pain in left hand: Secondary | ICD-10-CM | POA: Diagnosis not present

## 2019-06-16 DIAGNOSIS — M25562 Pain in left knee: Secondary | ICD-10-CM | POA: Diagnosis not present

## 2019-06-16 DIAGNOSIS — M79641 Pain in right hand: Secondary | ICD-10-CM | POA: Diagnosis not present

## 2019-06-16 MED FILL — GABAPENTIN 300 MG CAPSULE: 300 | 30 days supply | Qty: 120 | Fill #2

## 2019-06-16 MED FILL — DULoxetine HCL 30 MG CPEP: 30 | 30 days supply | Qty: 30 | Fill #2

## 2019-06-19 DIAGNOSIS — F331 Major depressive disorder, recurrent, moderate: Secondary | ICD-10-CM | POA: Diagnosis not present

## 2019-06-19 DIAGNOSIS — F54 Psychological and behavioral factors associated with disorders or diseases classified elsewhere: Secondary | ICD-10-CM | POA: Diagnosis not present

## 2019-06-26 DIAGNOSIS — F331 Major depressive disorder, recurrent, moderate: Secondary | ICD-10-CM | POA: Diagnosis not present

## 2019-06-26 DIAGNOSIS — F54 Psychological and behavioral factors associated with disorders or diseases classified elsewhere: Secondary | ICD-10-CM | POA: Diagnosis not present

## 2019-06-28 ENCOUNTER — Other Ambulatory Visit: Payer: Self-pay | Admitting: Internal Medicine

## 2019-06-28 DIAGNOSIS — I10 Essential (primary) hypertension: Secondary | ICD-10-CM

## 2019-06-28 MED FILL — BUPROPION HCL XL 300 MG TAB: 300 | 30 days supply | Qty: 30 | Fill #1

## 2019-06-28 MED FILL — metFORMIN HCL 500 MG TABS: 500 | 30 days supply | Qty: 15 | Fill #2

## 2019-06-28 MED FILL — LEVOTHYROXINE 88 MCG TABLET: 88 | 30 days supply | Qty: 30 | Fill #1

## 2019-06-30 ENCOUNTER — Ambulatory Visit: Payer: Medicaid Other | Admitting: Internal Medicine

## 2019-07-03 ENCOUNTER — Encounter: Payer: Self-pay | Admitting: Gastroenterology

## 2019-07-03 ENCOUNTER — Ambulatory Visit: Payer: Medicaid Other | Admitting: Gastroenterology

## 2019-07-03 ENCOUNTER — Other Ambulatory Visit: Payer: Self-pay

## 2019-07-03 VITALS — BP 154/64 | HR 75 | Temp 97.0°F | Ht 68.0 in | Wt 345.0 lb

## 2019-07-03 DIAGNOSIS — R194 Change in bowel habit: Secondary | ICD-10-CM

## 2019-07-03 DIAGNOSIS — R1030 Lower abdominal pain, unspecified: Secondary | ICD-10-CM

## 2019-07-03 DIAGNOSIS — K5900 Constipation, unspecified: Secondary | ICD-10-CM | POA: Diagnosis not present

## 2019-07-03 DIAGNOSIS — R198 Other specified symptoms and signs involving the digestive system and abdomen: Secondary | ICD-10-CM | POA: Diagnosis not present

## 2019-07-03 DIAGNOSIS — K219 Gastro-esophageal reflux disease without esophagitis: Secondary | ICD-10-CM | POA: Diagnosis not present

## 2019-07-03 NOTE — Progress Notes (Signed)
Primary Care Physician:  Ladell Pier, MD  Primary Gastroenterologist:  Garfield Cornea, MD   Chief Complaint  Patient presents with  . Gastroesophageal Reflux  . Abdominal Pain    comes/goes, has a lot of fullness feeling on left side  . Constipation    BM's are daily mostly, very little straining    HPI:  Robin [name] is a 62 y.o. female here for follow-up.  She was seen back in January 2020 for GERD and abdominal pain.  She also has fatty liver.  EGD April 2019 showed erosive gastropathy, chronic gastritis on biopsy but no H. pylori.  Normal-appearing esophagus was dilated.  Last colonoscopy was November 2012 with abnormal terminal ileum for 10 cm, erosions, geographic ulcerations.  Small bowel biopsy with prominent intramucosal lymphoid aggregates, slightly inflamed.  Findings thought to be due to NSAIDs.  GERD has been difficult to manage, failed omeprazole twice daily, ranitidine.  Pantoprazole twice daily helped initially but developed recurrent symptoms eventually.  AcipHex twice daily initially helped but lost efficacy.   At last office visit we switched her AcipHex to Dexilant 60 mg daily.  CT abdomen pelvis with contrast in February 2020 to evaluate lower abdominal pain and change in bowel habits was unremarkable.  We advised colonoscopy with propofol at the time. She has had to reschedule colonoscopy several times.  States she is having a lot of health issues.  A lot of doctor's appointments. Running tests. MRI brain, tumor normal pituitary gland growing back, has to go back to [facility] follow-up. Having problems with left neck, MRI neck, some degenerative changes in the neck.  Still seeing counselor for bariatric issues. Waiting to see new bariatric provider that she was switched to. Husband cheating on her.   BM daily, some days more effective than others on Linzess.  Will have soft bowel movement during the day but at night stools may be harder. New change for her.   Sometimes feels incomplete. Some rectal discomfort. Feels full left abdomen. No melena, brbpr. Some nausea but no vomiting. Heartburn still. Dexilant plus TUMS as needed.  Tums helps with breakthrough symptoms.  Current Outpatient Medications  Medication Sig Dispense Refill  . ACCU-CHEK AVIVA PLUS test strip USE AS DIRECTED 100 each 12  . acetaminophen (TYLENOL) 500 MG tablet Take 1,000 mg by mouth 2 (two) times daily.     Marland Kitchen amitriptyline (ELAVIL) 50 MG tablet TAKE 1 TABLET BY MOUTH AT BEDTIME. 30 tablet 2  . amLODipine (NORVASC) 5 MG tablet Take 1 tablet (5 mg total) by mouth daily. *NEEDS OFFICE VISIT-2ND ATTEMPT* 15 tablet 0  . aspirin EC 81 MG tablet Take 81 mg by mouth at bedtime.    Marland Kitchen atorvastatin (LIPITOR) 10 MG tablet Take 1 tablet (10 mg total) by mouth daily. 90 tablet 3  . buPROPion (WELLBUTRIN XL) 300 MG 24 hr tablet TAKE 1 TABLET (300 MG TOTAL) BY MOUTH EVERY MORNING. 30 tablet 2  . calcium carbonate (TUMS - DOSED IN MG ELEMENTAL CALCIUM) 500 MG chewable tablet Chew 2 tablets by mouth at bedtime.    . celecoxib (CELEBREX) 100 MG capsule Take 100 mg by mouth 2 (two) times daily.    . cetirizine (ZYRTEC) 10 MG tablet Take 1 tablet (10 mg total) by mouth daily. 30 tablet 1  . chlorpheniramine (CHLOR-TRIMETON) 4 MG tablet Take 4 mg by mouth every 4 (four) hours as needed for allergies.     Marland Kitchen CINNAMON PO Take 1 capsule by mouth daily.    Marland Kitchen  DEXILANT 60 MG capsule TAKE 1 CAPSULE (60 MG TOTAL) BY MOUTH DAILY. 30 capsule 11  . diclofenac sodium (VOLTAREN) 1 % GEL Apply 2-4 g topically 4 (four) times daily as needed (PAIN.).    Marland Kitchen DULoxetine (CYMBALTA) 30 MG capsule Take 1 capsule (30 mg total) by mouth daily. (Patient taking differently: Take 30 mg by mouth at bedtime. ) 30 capsule 6  . fexofenadine (ALLEGRA) 180 MG tablet Take 180 mg by mouth daily as needed for allergies or rhinitis.    . fluticasone (FLONASE) 50 MCG/ACT nasal spray Place 1 spray into both nostrils daily. 16 g 1  .  furosemide (LASIX) 40 MG tablet TAKE 1/2 TO 1 TABLET BY MOUTH DAILY FOR LOWER EXTREMITY SWELLING 30 tablet 2  . gabapentin (NEURONTIN) 300 MG capsule TAKE 1 CAPSULE (300 MG TOTAL) BY MOUTH 4 (FOUR) TIMES DAILY. 120 capsule 2  . glucose blood (ONETOUCH ULTRA) test strip Use as instructed 100 each 12  . glucose blood test strip Accu-Chek Aviva Plus test strips    . hydrALAZINE (APRESOLINE) 50 MG tablet Take 2 tablets (100 mg total) by mouth 3 (three) times daily. 180 tablet 5  . levothyroxine (SYNTHROID) 88 MCG tablet TAKE 1 TABLET BY MOUTH DAILY. 30 tablet 2  . LINZESS 290 MCG CAPS capsule TAKE 1 CAPSULE BY MOUTH DAILY BEFORE BREAKFAST. 30 capsule 5  . metFORMIN (GLUCOPHAGE) 500 MG tablet TAKE 1/2 TABLET DAILY WITH BREAKFAST 15 tablet 2  . Metoprolol Tartrate 75 MG TABS TAKE 1 TABLET BY MOUTH TWICE DAILY. 60 tablet 2  . montelukast (SINGULAIR) 10 MG tablet TAKE 1 TABLET BY MOUTH AT BEDTIME. 30 tablet 2  . Multiple Vitamin (MULTIVITAMIN WITH MINERALS) TABS tablet Take 1 tablet by mouth daily. Alive Women's Multivitamin    . Omega-3 Fatty Acids (FISH OIL) 1000 MG CAPS Take 1,000 mg by mouth every evening.    Marland Kitchen OVER THE COUNTER MEDICATION Take 1 capsule by mouth daily. BEET ROOT    . oxyCODONE-acetaminophen (PERCOCET) 10-325 MG tablet Take 1 tablet by mouth 2 (two) times daily.     Marland Kitchen PREMARIN vaginal cream Place 1 Applicatorful vaginally at bedtime.   3  . PROVENTIL HFA 108 (90 Base) MCG/ACT inhaler INHALE 2 PUFFS INTO THE LUNGS EVERY 6 HOURS AS NEEDED FOR WHEEZING OR SHORTNESS OF BREATH. (Patient taking differently: Inhale 2 puffs into the lungs every 6 (six) hours as needed for wheezing or shortness of breath. ) 6.7 g 2  . sodium chloride (OCEAN) 0.65 % SOLN nasal spray Place 2 sprays into both nostrils every 4 (four) hours as needed for congestion.    Marland Kitchen spironolactone (ALDACTONE) 25 MG tablet TAKE 1 TABLET (25 MG TOTAL) BY MOUTH DAILY. 30 tablet 2  . vitamin B-12 (CYANOCOBALAMIN) 1000 MCG tablet  Take 1,000 mcg by mouth daily.    . polyethylene glycol-electrolytes (TRILYTE) 420 g solution Take 4,000 mLs by mouth as directed. (Patient not taking: Reported on 07/03/2019) 4000 mL 0   No current facility-administered medications for this visit.     Allergies as of 07/03/2019 - Review Complete 07/03/2019  Allergen Reaction Noted  . Celexa [citalopram hydrobromide] Other (See Comments) 05/28/2017  . Gabapentin  05/03/2018  . Norvasc [amlodipine besylate]  05/03/2018  . Diflucan [fluconazole] Rash 02/08/2018    Past Medical History:  Diagnosis Date  . Anxiety disorder   . Arthritis   . Asthma   . Chronic neck pain   . Constipation   . Depression   . Diabetes  mellitus without complication ([place])   . Dyspnea   . GERD (gastroesophageal reflux disease)   . Hiatal hernia    small  . Hyperlipidemia   . Hypertension   . Hypothyroidism   . Schatzki's ring    non critical  . Sleep apnea    CPAP, Sleep study at [facility]  . Sleep apnea     Past Surgical History:  Procedure Laterality Date  . ABDOMINAL HYSTERECTOMY    . ABDOMINAL HYSTERECTOMY  02/18/2000  . CARDIAC CATHETERIZATION  08/18/2003   normal L main/LAD/L Cfx/RCA (Dr. Adora Fridge)  . COLONOSCOPY  2006   Dr. [name], hyperplastic polyps  . COLONOSCOPY  08/06/11   abnormal terminal ileum for 10cm, erosions, geographical ulceration. Bx small bowel mucosa with prominent intramucosal lymphoid aggregates, slightly inflammed  . DIRECT LARYNGOSCOPY  03/15/2012   Procedure: DIRECT LARYNGOSCOPY;  Surgeon: [name], MD;  Location: [place];  Service: ENT;  Laterality: N/A; Dr. Wolicki--:>no foreign body seen. normal esophagus to 40cm  . ESOPHAGEAL DILATION  04/17/2015   Procedure: ESOPHAGEAL DILATION;  Surgeon: [name], MD;  [facility];  Service: Endoscopy;;  . ESOPHAGOGASTRODUODENOSCOPY  08/23/2008   [id number] distal esophageal erosions consistent with mild erosive reflux esophagitis, otherwise  unremarkable esophagus/ Tiny antral erosions of doubtful clinical significance, otherwise normal stomach, patent pylorus, normal D1 and D2  . ESOPHAGOGASTRODUODENOSCOPY  08/06/11   small hh, noncritical Schatzki's ring s/p 5 F  . ESOPHAGOGASTRODUODENOSCOPY N/A 04/17/2015   [id number] s/p dilation  . ESOPHAGOGASTRODUODENOSCOPY (EGD) WITH PROPOFOL N/A 01/20/2018   Dr. [name]: Erosive gastropathy, normal-appearing esophagus status post empiric dilation.  Chronic gastritis, no H. pylori.  . ESOPHAGOSCOPY  03/15/2012   Procedure: ESOPHAGOSCOPY;  Surgeon: [name], MD;  Location: [place];  Service: ENT;  Laterality: N/A;  . KNEE ARTHROSCOPY  04/27/2001  . MALONEY DILATION N/A 01/20/2018   Procedure: Venia Minks DILATION;  Surgeon: [name], MD;  [facility];  Service: Endoscopy;  Laterality: N/A;  . [place]  2003   negative bruce protocol exercise tress test; EF 68%; intermediate risk study due to evidence of anterior wall ischemia extending from mid-ventricle to apex  . PITUITARY SURGERY  06/2012   benign tumor, surgeon at [facility]  . RECTOCELE REPAIR    . TRANSTHORACIC ECHOCARDIOGRAM  2003   EF normal  . VAGINAL PROLAPSE REPAIR    . VIDEO BRONCHOSCOPY Bilateral 03/08/2017   Procedure: VIDEO BRONCHOSCOPY WITHOUT FLUORO;  Surgeon: Javier Glazier, MD;  Location: [place];  Service: Cardiopulmonary;  Laterality: Bilateral;    Family History  Problem Relation Age of Onset  . Kidney cancer Father   . Hypertension Father   . Hyperlipidemia Father   . Sleep apnea Father   . Diabetes Mother   . Hypertension Mother   . Heart Problems Mother   . Hyperlipidemia Mother   . Asthma Mother   . Sleep apnea Mother   . Liver disease Brother   . Arthritis Brother   . Hypertension Sister   . Allergic rhinitis Sister   . Stomach cancer Other        aunt  . Breast cancer Maternal Grandmother   . Kidney disease Maternal Grandfather   . Breast cancer Paternal  Grandmother   . Hypertension Brother   . Hyperlipidemia Brother   . Hypertension Brother   . Hypertension Sister   . Hyperlipidemia Sister   . Lupus Maternal Aunt   .  Colon cancer Neg Hx   . Eczema Neg Hx   . Immunodeficiency Neg Hx   . Urticaria Neg Hx     Social History   Socioeconomic History  . Marital status: Married    Spouse name: Not on file  . Number of children: 4  . Years of education: 10  . Highest education level: Not on file  Occupational History    Employer: UNEMPLOYED  Social Needs  . Financial resource strain: Not on file  . Food insecurity    Worry: Not on file    Inability: Not on file  . Transportation needs    Medical: Not on file    Non-medical: Not on file  Tobacco Use  . Smoking status: Former Smoker    Packs/day: 0.50    Years: 10.00    Pack years: 5.00    Types: Cigarettes    Start date: 07/14/1975    Quit date: 04/04/1993    Years since quitting: 26.2  . Smokeless tobacco: Never Used  . Tobacco comment: 17 yrs ago  Substance and Sexual Activity  . Alcohol use: No    Alcohol/week: 0.0 standard drinks  . Drug use: No  . Sexual activity: Not on file  Lifestyle  . Physical activity    Days per week: Not on file    Minutes per session: Not on file  . Stress: Not on file  Relationships  . Social Herbalist on phone: Not on file    Gets together: Not on file    Attends religious service: Not on file    Active member of club or organization: Not on file    Attends meetings of clubs or organizations: Not on file    Relationship status: Not on file  . Intimate partner violence    Fear of current or ex partner: Not on file    Emotionally abused: Not on file    Physically abused: Not on file    Forced sexual activity: Not on file  Other Topics Concern  . Not on file  Social History Narrative   Lives with husband in an apartment on the first floor.  Has 4 children.     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  Trying to get disability.  Formerly worked as a Teacher, early years/pre.  Education: 11th grade.      [facility] (12/23/16):   Originally from [place]. She was raised in [place]. 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 No pets currently. No bird exposure. She does have mold in her current home in the bathroom, laundry room, & master bedroom.       ROS:  General: Negative for anorexia, weight loss, fever, chills,+ fatigue, weakness.  Weight up 15 pounds in the past year. Eyes: Negative for vision changes.  ENT: Negative for hoarseness, difficulty swallowing , nasal congestion. CV: Negative for chest pain, angina, palpitations, dyspnea on exertion, peripheral edema.  Respiratory: Negative for dyspnea at rest, dyspnea on exertion, cough, sputum, wheezing.  GI: See history of present illness. GU:  Negative for dysuria, hematuria, urinary incontinence, urinary frequency, nocturnal urination.  MS: Negative for joint pain, low back pain.  Chronic neck pain Derm: Negative for rash or itching.  Neuro: Negative for weakness, abnormal sensation, seizure, frequent headaches, memory  loss, confusion.  Psych: Negative for anxiety, depression, suicidal ideation, hallucinations.  Endo: Negative for unusual weight change.  Heme: Negative for bruising or bleeding. Allergy: Negative for rash or hives.    Physical Examination:  BP (!) 154/64   Pulse 75   Temp (!) 97 F (36.1 C) (Oral)   Ht 5\' 8"  (1.727 m)   Wt (!) 345 lb (156.5 kg)   BMI 52.46 kg/m    General: Well-nourished, well-developed in no acute distress.  Head: Normocephalic, atraumatic.   Eyes: Conjunctiva pink, no icterus. Mouth: masked Neck: Supple without thyromegaly, masses, or lymphadenopathy.  Lungs: Clear to auscultation bilaterally.  Heart: Regular rate and rhythm, no murmurs rubs or gallops.   Abdomen: Bowel sounds are normal, nontender, nondistended, no hepatosplenomegaly or masses, no abdominal bruits or    hernia , no rebound or guarding.  Exam limited due to body habitus  rectal: not performed Extremities: No lower extremity edema. No clubbing or deformities.  Neuro: Alert and oriented x 4 , grossly normal neurologically.  Skin: Warm and dry, no rash or jaundice.   Psych: Alert and cooperative, normal mood and affect.  Labs: Lab Results  Component Value Date   CREATININE 0.78 06/01/2019   BUN 14 06/01/2019   NA 138 06/01/2019   K 4.2 06/01/2019   CL 102 06/01/2019   CO2 28 06/01/2019   Lab Results  Component Value Date   ALT 25 06/01/2019   AST 19 06/01/2019   ALKPHOS 61 06/01/2019   BILITOT 0.7 06/01/2019   Lab Results  Component Value Date   WBC 7.9 06/01/2019   HGB 12.0 06/01/2019   HCT 40.5 06/01/2019   MCV 96.4 06/01/2019   PLT 310 06/01/2019   Lab Results  Component Value Date   LIPASE 22 06/01/2019     Imaging Studies: No results found.

## 2019-07-03 NOTE — Assessment & Plan Note (Addendum)
Previously well managed on Linzess.  Seen for change in bowel habits earlier this year.  Increasing difficulty managing constipation, stools soft to hard throughout the day.  Colonoscopy planned for change in bowel habits as well as lower abdominal pain, has been rescheduled several times.  She is interested in pursuing at this time.  Plan for deep sedation given polypharmacy/morbid obesity.  I have discussed the risks, alternatives, benefits with regards to but not limited to the risk of reaction to medication, bleeding, infection, perforation and the patient is agreeable to proceed. Written consent to be obtained.

## 2019-07-03 NOTE — Patient Instructions (Addendum)
1. Colonoscopy as scheduled.  Please see separate instructions. 2. Continue Dexilant 60 mg, 30 minutes before breakfast.  Continue Tums as needed per package instructions. 3. Continue Linzess 290 mcg daily with or without food.  Taking it with food may make you have more productive bowel movements but if you develop diarrhea, go back to taking without food. 4. Continue to follow-up with bariatric provider.  Weight loss will be so helpful for multiple health issues including GERD management.

## 2019-07-03 NOTE — Assessment & Plan Note (Signed)
Historically difficult to manage.  Continue Dexilant 60 mg daily before breakfast.  Continue over-the-counter antacids such as Tums per package labeling.  Reinforced antireflux measures.  She really needs to continue to pursue weight loss surgery as significant weight reduction will help her reflux symptoms as well as her other comorbidities.

## 2019-07-10 DIAGNOSIS — D352 Benign neoplasm of pituitary gland: Secondary | ICD-10-CM | POA: Diagnosis not present

## 2019-07-10 DIAGNOSIS — Z9889 Other specified postprocedural states: Secondary | ICD-10-CM | POA: Diagnosis not present

## 2019-07-11 ENCOUNTER — Other Ambulatory Visit: Payer: Self-pay | Admitting: Internal Medicine

## 2019-07-11 ENCOUNTER — Ambulatory Visit: Payer: Medicaid Other | Admitting: Podiatry

## 2019-07-11 ENCOUNTER — Other Ambulatory Visit: Payer: Self-pay

## 2019-07-11 ENCOUNTER — Encounter: Payer: Self-pay | Admitting: Podiatry

## 2019-07-11 DIAGNOSIS — E1142 Type 2 diabetes mellitus with diabetic polyneuropathy: Secondary | ICD-10-CM | POA: Diagnosis not present

## 2019-07-11 DIAGNOSIS — M79674 Pain in right toe(s): Secondary | ICD-10-CM

## 2019-07-11 DIAGNOSIS — B351 Tinea unguium: Secondary | ICD-10-CM | POA: Diagnosis not present

## 2019-07-11 DIAGNOSIS — M79675 Pain in left toe(s): Secondary | ICD-10-CM | POA: Diagnosis not present

## 2019-07-11 MED FILL — METOPROLOL TARTRATE 75 MG T: 75 | 30 days supply | Qty: 60 | Fill #2

## 2019-07-11 MED FILL — MONTELUKAST SOD 10 MG TAB: 10 | 30 days supply | Qty: 30 | Fill #2

## 2019-07-11 MED FILL — ATORVASTATIN 10 MG TABLET: 10 | 30 days supply | Qty: 30 | Fill #3

## 2019-07-11 MED FILL — DEXILANT DR 60 MG CAPSULE: 60 | 30 days supply | Qty: 30 | Fill #1

## 2019-07-11 MED FILL — DULoxetine HCL 30 MG CPEP: 30 | 30 days supply | Qty: 30 | Fill #3

## 2019-07-11 MED FILL — CELECOXIB 100 MG CAPSULE: 100 | 30 days supply | Qty: 60 | Fill #2

## 2019-07-11 NOTE — Progress Notes (Signed)
Subjective: Robin [name] is seen today for follow up painful, elongated, thickened toenails 1-5 b/l feet that she cannot cut. Pain interferes with daily activities. Aggravating factor includes wearing enclosed shoe gear and relieved with periodic debridement.  She is accompanied by her husband on today's visit. She voices no new pedal concerns on today's visit.  Current Outpatient Medications on File Prior to Visit  Medication Sig  . ACCU-CHEK AVIVA PLUS test strip USE AS DIRECTED  . acetaminophen (TYLENOL) 500 MG tablet Take 1,000 mg by mouth 2 (two) times daily.   Marland Kitchen amitriptyline (ELAVIL) 50 MG tablet TAKE 1 TABLET BY MOUTH AT BEDTIME.  Marland Kitchen amLODipine (NORVASC) 5 MG tablet Take 1 tablet (5 mg total) by mouth daily. *NEEDS OFFICE VISIT-2ND ATTEMPT*  . aspirin EC 81 MG tablet Take 81 mg by mouth at bedtime.  Marland Kitchen atorvastatin (LIPITOR) 10 MG tablet Take 1 tablet (10 mg total) by mouth daily.  Marland Kitchen buPROPion (WELLBUTRIN XL) 300 MG 24 hr tablet TAKE 1 TABLET (300 MG TOTAL) BY MOUTH EVERY MORNING.  . calcium carbonate (TUMS - DOSED IN MG ELEMENTAL CALCIUM) 500 MG chewable tablet Chew 2 tablets by mouth at bedtime.  . celecoxib (CELEBREX) 100 MG capsule Take 100 mg by mouth 2 (two) times daily.  . cetirizine (ZYRTEC) 10 MG tablet Take 1 tablet (10 mg total) by mouth daily.  . chlorpheniramine (CHLOR-TRIMETON) 4 MG tablet Take 4 mg by mouth every 4 (four) hours as needed for allergies.   Marland Kitchen CINNAMON PO Take 1 capsule by mouth daily.  Marland Kitchen DEXILANT 60 MG capsule TAKE 1 CAPSULE (60 MG TOTAL) BY MOUTH DAILY.  Marland Kitchen diclofenac sodium (VOLTAREN) 1 % GEL Apply 2-4 g topically 4 (four) times daily as needed (PAIN.).  Marland Kitchen DULoxetine (CYMBALTA) 30 MG capsule Take 1 capsule (30 mg total) by mouth daily. (Patient taking differently: Take 30 mg by mouth at bedtime. )  . fexofenadine (ALLEGRA) 180 MG tablet Take 180 mg by mouth daily as needed for allergies or rhinitis.  . fluticasone (FLONASE) 50 MCG/ACT nasal spray Place 1  spray into both nostrils daily.  . furosemide (LASIX) 40 MG tablet TAKE 1/2 TO 1 TABLET BY MOUTH DAILY FOR LOWER EXTREMITY SWELLING  . gabapentin (NEURONTIN) 300 MG capsule TAKE 1 CAPSULE (300 MG TOTAL) BY MOUTH 4 (FOUR) TIMES DAILY.  Marland Kitchen glucose blood (ONETOUCH ULTRA) test strip Use as instructed  . glucose blood test strip Accu-Chek Aviva Plus test strips  . hydrALAZINE (APRESOLINE) 50 MG tablet Take 2 tablets (100 mg total) by mouth 3 (three) times daily.  Marland Kitchen levothyroxine (SYNTHROID) 88 MCG tablet TAKE 1 TABLET BY MOUTH DAILY.  Marland Kitchen LINZESS 290 MCG CAPS capsule TAKE 1 CAPSULE BY MOUTH DAILY BEFORE BREAKFAST.  . metFORMIN (GLUCOPHAGE) 500 MG tablet TAKE 1/2 TABLET DAILY WITH BREAKFAST  . Metoprolol Tartrate 75 MG TABS TAKE 1 TABLET BY MOUTH TWICE DAILY.  . montelukast (SINGULAIR) 10 MG tablet TAKE 1 TABLET BY MOUTH AT BEDTIME.  . Multiple Vitamin (MULTIVITAMIN WITH MINERALS) TABS tablet Take 1 tablet by mouth daily. Alive Women's Multivitamin  . Omega-3 Fatty Acids (FISH OIL) 1000 MG CAPS Take 1,000 mg by mouth every evening.  Marland Kitchen OVER THE COUNTER MEDICATION Take 1 capsule by mouth daily. BEET ROOT  . oxyCODONE-acetaminophen (PERCOCET) 10-325 MG tablet Take 1 tablet by mouth 2 (two) times daily.   . polyethylene glycol-electrolytes (TRILYTE) 420 g solution Take 4,000 mLs by mouth as directed. (Patient not taking: Reported on 07/03/2019)  . PREMARIN vaginal  cream Place 1 Applicatorful vaginally at bedtime.   Marland Kitchen PROVENTIL HFA 108 (90 Base) MCG/ACT inhaler INHALE 2 PUFFS INTO THE LUNGS EVERY 6 HOURS AS NEEDED FOR WHEEZING OR SHORTNESS OF BREATH. (Patient taking differently: Inhale 2 puffs into the lungs every 6 (six) hours as needed for wheezing or shortness of breath. )  . sodium chloride (OCEAN) 0.65 % SOLN nasal spray Place 2 sprays into both nostrils every 4 (four) hours as needed for congestion.  Marland Kitchen spironolactone (ALDACTONE) 25 MG tablet TAKE 1 TABLET (25 MG TOTAL) BY MOUTH DAILY.  . vitamin B-12  (CYANOCOBALAMIN) 1000 MCG tablet Take 1,000 mcg by mouth daily.   No current facility-administered medications on file prior to visit.      Allergies  Allergen Reactions  . Celexa [Citalopram Hydrobromide] Other (See Comments)    "Made me feel out of it"  . Gabapentin     Contributed to lower extremity edema  . Norvasc [Amlodipine Besylate]     Edema in lower extremities  . Diflucan [Fluconazole] Rash     Objective:   Vascular Examination: Capillary refill time immediate x 10 digits.  Dorsalis pedis present b/l.  Posterior tibial pulses present b/l.  Digital hair absent b/l.  Skin temperature gradient WNL b/l.   Dermatological Examination: Skin with normal turgor, texture and tone b/l.  Toenails 1-5 b/l discolored, thick, dystrophic with subungual debris and pain with palpation to nailbeds due to thickness of nails.  Musculoskeletal: Muscle strength 5/5 to all LE muscle groups.  Pes planus foot deformity b/l.  No pain, crepitus or joint limitation noted with ROM.   Neurological Examination: Protective sensation absent  with 10 gram monofilament bilaterally.  Assessment: Painful onychomycosis toenails 1-5 b/l   Plan: 1. Toenails 1-5 b/l were debrided in length and girth without iatrogenic bleeding. 2. Patient to continue soft, supportive shoe gear 3. Patient to report any pedal injuries to medical professional immediately. 4. Follow up 3 months.  5. Patient/POA to call should there be a concern in the interim.

## 2019-07-11 NOTE — Patient Instructions (Signed)
Diabetes Mellitus and Foot Care Foot care is an important part of your health, especially when you have diabetes. Diabetes may cause you to have problems because of poor blood flow (circulation) to your feet and legs, which can cause your skin to:  Become thinner and drier.  Break more easily.  Heal more slowly.  Peel and crack. You may also have nerve damage (neuropathy) in your legs and feet, causing decreased feeling in them. This means that you may not notice minor injuries to your feet that could lead to more serious problems. Noticing and addressing any potential problems early is the best way to prevent future foot problems. How to care for your feet Foot hygiene  Wash your feet daily with warm water and mild soap. Do not use hot water. Then, pat your feet and the areas between your toes until they are completely dry. Do not soak your feet as this can dry your skin.  Trim your toenails straight across. Do not dig under them or around the cuticle. File the edges of your nails with an emery board or nail file.  Apply a moisturizing lotion or petroleum jelly to the skin on your feet and to dry, brittle toenails. Use lotion that does not contain alcohol and is unscented. Do not apply lotion between your toes. Shoes and socks  Wear clean socks or stockings every day. Make sure they are not too tight. Do not wear knee-high stockings since they may decrease blood flow to your legs.  Wear shoes that fit properly and have enough cushioning. Always look in your shoes before you put them on to be sure there are no objects inside.  To break in new shoes, wear them for just a few hours a day. This prevents injuries on your feet. Wounds, scrapes, corns, and calluses  Check your feet daily for blisters, cuts, bruises, sores, and redness. If you cannot see the bottom of your feet, use a mirror or ask someone for help.  Do not cut corns or calluses or try to remove them with medicine.  If you  find a minor scrape, cut, or break in the skin on your feet, keep it and the skin around it clean and dry. You may clean these areas with mild soap and water. Do not clean the area with peroxide, alcohol, or iodine.  If you have a wound, scrape, corn, or callus on your foot, look at it several times a day to make sure it is healing and not infected. Check for: ? Redness, swelling, or pain. ? Fluid or blood. ? Warmth. ? Pus or a bad smell. General instructions  Do not cross your legs. This may decrease blood flow to your feet.  Do not use heating pads or hot water bottles on your feet. They may burn your skin. If you have lost feeling in your feet or legs, you may not know this is happening until it is too late.  Protect your feet from hot and cold by wearing shoes, such as at the beach or on hot pavement.  Schedule a complete foot exam at least once a year (annually) or more often if you have foot problems. If you have foot problems, report any cuts, sores, or bruises to your health care provider immediately. Contact a health care provider if:  You have a medical condition that increases your risk of infection and you have any cuts, sores, or bruises on your feet.  You have an injury that is not   healing.  You have redness on your legs or feet.  You feel burning or tingling in your legs or feet.  You have pain or cramps in your legs and feet.  Your legs or feet are numb.  Your feet always feel cold.  You have pain around a toenail. Get help right away if:  You have a wound, scrape, corn, or callus on your foot and: ? You have pain, swelling, or redness that gets worse. ? You have fluid or blood coming from the wound, scrape, corn, or callus. ? Your wound, scrape, corn, or callus feels warm to the touch. ? You have pus or a bad smell coming from the wound, scrape, corn, or callus. ? You have a fever. ? You have a red line going up your leg. Summary  Check your feet every day  for cuts, sores, red spots, swelling, and blisters.  Moisturize feet and legs daily.  Wear shoes that fit properly and have enough cushioning.  If you have foot problems, report any cuts, sores, or bruises to your health care provider immediately.  Schedule a complete foot exam at least once a year (annually) or more often if you have foot problems. This information is not intended to replace advice given to you by your health care provider. Make sure you discuss any questions you have with your health care provider. Document Released: 09/18/2000 Document Revised: 11/03/2017 Document Reviewed: 10/23/2016 [employer] Patient Education  2020 [employer]   Onychomycosis/Fungal Toenails  WHAT IS IT? An infection that lies within the keratin of your nail plate that is caused by a fungus.  WHY ME? Fungal infections affect all ages, sexes, races, and creeds.  There may be many factors that predispose you to a fungal infection such as age, coexisting medical conditions such as diabetes, or an autoimmune disease; stress, medications, fatigue, genetics, etc.  Bottom line: fungus thrives in a warm, moist environment and your shoes offer such a location.  IS IT CONTAGIOUS? Theoretically, yes.  You do not want to share shoes, nail clippers or files with someone who has fungal toenails.  Walking around barefoot in the same room or sleeping in the same bed is unlikely to transfer the organism.  It is important to realize, however, that fungus can spread easily from one nail to the next on the same foot.  HOW DO WE TREAT THIS?  There are several ways to treat this condition.  Treatment may depend on many factors such as age, medications, pregnancy, liver and kidney conditions, etc.  It is best to ask your doctor which options are available to you.  1. No treatment.   Unlike many other medical concerns, you can live with this condition.  However for many people this can be a painful condition and may lead to  ingrown toenails or a bacterial infection.  It is recommended that you keep the nails cut short to help reduce the amount of fungal nail. 2. Topical treatment.  These range from herbal remedies to prescription strength nail lacquers.  About 40-50% effective, topicals require twice daily application for approximately 9 to 12 months or until an entirely new nail has grown out.  The most effective topicals are medical grade medications available through physicians offices. 3. Oral antifungal medications.  With an 80-90% cure rate, the most common oral medication requires 3 to 4 months of therapy and stays in your system for a year as the new nail grows out.  Oral antifungal medications do require   blood work to make sure it is a safe drug for you.  A liver function panel will be performed prior to starting the medication and after the first month of treatment.  It is important to have the blood work performed to avoid any harmful side effects.  In general, this medication safe but blood work is required. 4. Laser Therapy.  This treatment is performed by applying a specialized laser to the affected nail plate.  This therapy is noninvasive, fast, and non-painful.  It is not covered by insurance and is therefore, out of pocket.  The results have been very good with a 80-95% cure rate.  [facility] is the only practice in the area to offer this therapy. 5. Permanent Nail Avulsion.  Removing the entire nail so that a new nail will not grow back. 

## 2019-07-13 DIAGNOSIS — M545 Low back pain: Secondary | ICD-10-CM | POA: Diagnosis not present

## 2019-07-13 DIAGNOSIS — M25551 Pain in right hip: Secondary | ICD-10-CM | POA: Diagnosis not present

## 2019-07-13 DIAGNOSIS — M79641 Pain in right hand: Secondary | ICD-10-CM | POA: Diagnosis not present

## 2019-07-13 DIAGNOSIS — M25552 Pain in left hip: Secondary | ICD-10-CM | POA: Diagnosis not present

## 2019-07-17 DIAGNOSIS — Z6841 Body Mass Index (BMI) 40.0 and over, adult: Secondary | ICD-10-CM | POA: Diagnosis not present

## 2019-07-17 DIAGNOSIS — M8949 Other hypertrophic osteoarthropathy, multiple sites: Secondary | ICD-10-CM | POA: Diagnosis not present

## 2019-07-17 DIAGNOSIS — E538 Deficiency of other specified B group vitamins: Secondary | ICD-10-CM | POA: Diagnosis not present

## 2019-07-17 DIAGNOSIS — R600 Localized edema: Secondary | ICD-10-CM | POA: Diagnosis not present

## 2019-07-17 DIAGNOSIS — E782 Mixed hyperlipidemia: Secondary | ICD-10-CM | POA: Diagnosis not present

## 2019-07-17 DIAGNOSIS — F329 Major depressive disorder, single episode, unspecified: Secondary | ICD-10-CM | POA: Diagnosis not present

## 2019-07-17 DIAGNOSIS — E039 Hypothyroidism, unspecified: Secondary | ICD-10-CM | POA: Diagnosis not present

## 2019-07-17 DIAGNOSIS — I10 Essential (primary) hypertension: Secondary | ICD-10-CM | POA: Diagnosis not present

## 2019-07-17 DIAGNOSIS — E0800 Diabetes mellitus due to underlying condition with hyperosmolarity without nonketotic hyperglycemic-hyperosmolar coma (NKHHC): Secondary | ICD-10-CM | POA: Diagnosis not present

## 2019-07-17 DIAGNOSIS — M539 Dorsopathy, unspecified: Secondary | ICD-10-CM | POA: Diagnosis not present

## 2019-07-18 MED FILL — ACCU-CHEK AVIVA PLUS TEST S: 30 days supply | Qty: 100 | Fill #1

## 2019-07-20 MED FILL — metFORMIN HCL 500 MG TABS: 500 | 30 days supply | Qty: 15 | Fill #2

## 2019-07-20 MED FILL — LEVOTHYROXINE 88 MCG TABLET: 88 | 30 days supply | Qty: 30 | Fill #1

## 2019-07-21 DIAGNOSIS — F331 Major depressive disorder, recurrent, moderate: Secondary | ICD-10-CM | POA: Diagnosis not present

## 2019-07-21 MED FILL — BUPROPION HCL XL 300 MG TAB: 300 | 30 days supply | Qty: 30 | Fill #1

## 2019-07-24 ENCOUNTER — Other Ambulatory Visit: Payer: Self-pay

## 2019-07-24 DIAGNOSIS — F331 Major depressive disorder, recurrent, moderate: Secondary | ICD-10-CM | POA: Diagnosis not present

## 2019-07-24 DIAGNOSIS — F54 Psychological and behavioral factors associated with disorders or diseases classified elsewhere: Secondary | ICD-10-CM | POA: Diagnosis not present

## 2019-07-24 MED ORDER — SPIRONOLACTONE 25 MG PO TABS: 25.0000 mg | ORAL_TABLET | Freq: Every day | ORAL | 2 refills | Status: DC

## 2019-07-25 MED FILL — SPIRONOLACTONE 25 MG TABLET: 25 | 30 days supply | Qty: 30 | Fill #0

## 2019-07-27 DIAGNOSIS — H524 Presbyopia: Secondary | ICD-10-CM | POA: Diagnosis not present

## 2019-07-27 DIAGNOSIS — H52223 Regular astigmatism, bilateral: Secondary | ICD-10-CM | POA: Diagnosis not present

## 2019-07-27 DIAGNOSIS — E119 Type 2 diabetes mellitus without complications: Secondary | ICD-10-CM | POA: Diagnosis not present

## 2019-07-27 DIAGNOSIS — H04123 Dry eye syndrome of bilateral lacrimal glands: Secondary | ICD-10-CM | POA: Diagnosis not present

## 2019-07-27 DIAGNOSIS — H1045 Other chronic allergic conjunctivitis: Secondary | ICD-10-CM | POA: Diagnosis not present

## 2019-07-27 DIAGNOSIS — H5203 Hypermetropia, bilateral: Secondary | ICD-10-CM | POA: Diagnosis not present

## 2019-07-27 DIAGNOSIS — H2513 Age-related nuclear cataract, bilateral: Secondary | ICD-10-CM | POA: Diagnosis not present

## 2019-07-28 ENCOUNTER — Ambulatory Visit: Payer: Medicaid Other | Attending: Internal Medicine | Admitting: Internal Medicine

## 2019-07-28 ENCOUNTER — Ambulatory Visit (HOSPITAL_BASED_OUTPATIENT_CLINIC_OR_DEPARTMENT_OTHER): Payer: Medicaid Other | Admitting: Pharmacist

## 2019-07-28 ENCOUNTER — Other Ambulatory Visit: Payer: Self-pay

## 2019-07-28 ENCOUNTER — Encounter: Payer: Self-pay | Admitting: Internal Medicine

## 2019-07-28 VITALS — BP 138/65 | HR 64 | Temp 99.3°F | Resp 16

## 2019-07-28 DIAGNOSIS — E039 Hypothyroidism, unspecified: Secondary | ICD-10-CM | POA: Diagnosis not present

## 2019-07-28 DIAGNOSIS — I10 Essential (primary) hypertension: Secondary | ICD-10-CM | POA: Diagnosis not present

## 2019-07-28 DIAGNOSIS — K219 Gastro-esophageal reflux disease without esophagitis: Secondary | ICD-10-CM | POA: Diagnosis not present

## 2019-07-28 DIAGNOSIS — H9191 Unspecified hearing loss, right ear: Secondary | ICD-10-CM | POA: Diagnosis not present

## 2019-07-28 DIAGNOSIS — R2689 Other abnormalities of gait and mobility: Secondary | ICD-10-CM | POA: Diagnosis not present

## 2019-07-28 DIAGNOSIS — F419 Anxiety disorder, unspecified: Secondary | ICD-10-CM | POA: Insufficient documentation

## 2019-07-28 DIAGNOSIS — Z79899 Other long term (current) drug therapy: Secondary | ICD-10-CM | POA: Insufficient documentation

## 2019-07-28 DIAGNOSIS — Z8249 Family history of ischemic heart disease and other diseases of the circulatory system: Secondary | ICD-10-CM | POA: Insufficient documentation

## 2019-07-28 DIAGNOSIS — Z7982 Long term (current) use of aspirin: Secondary | ICD-10-CM | POA: Diagnosis not present

## 2019-07-28 DIAGNOSIS — M797 Fibromyalgia: Secondary | ICD-10-CM | POA: Insufficient documentation

## 2019-07-28 DIAGNOSIS — G4733 Obstructive sleep apnea (adult) (pediatric): Secondary | ICD-10-CM | POA: Insufficient documentation

## 2019-07-28 DIAGNOSIS — Z79890 Hormone replacement therapy (postmenopausal): Secondary | ICD-10-CM | POA: Diagnosis not present

## 2019-07-28 DIAGNOSIS — Z23 Encounter for immunization: Secondary | ICD-10-CM | POA: Diagnosis not present

## 2019-07-28 DIAGNOSIS — R0609 Other forms of dyspnea: Secondary | ICD-10-CM | POA: Diagnosis not present

## 2019-07-28 DIAGNOSIS — Z87891 Personal history of nicotine dependence: Secondary | ICD-10-CM | POA: Insufficient documentation

## 2019-07-28 DIAGNOSIS — M5412 Radiculopathy, cervical region: Secondary | ICD-10-CM

## 2019-07-28 DIAGNOSIS — R0600 Dyspnea, unspecified: Secondary | ICD-10-CM

## 2019-07-28 DIAGNOSIS — Z836 Family history of other diseases of the respiratory system: Secondary | ICD-10-CM | POA: Insufficient documentation

## 2019-07-28 DIAGNOSIS — D352 Benign neoplasm of pituitary gland: Secondary | ICD-10-CM | POA: Diagnosis not present

## 2019-07-28 DIAGNOSIS — Z7984 Long term (current) use of oral hypoglycemic drugs: Secondary | ICD-10-CM | POA: Diagnosis not present

## 2019-07-28 DIAGNOSIS — F329 Major depressive disorder, single episode, unspecified: Secondary | ICD-10-CM | POA: Insufficient documentation

## 2019-07-28 DIAGNOSIS — E785 Hyperlipidemia, unspecified: Secondary | ICD-10-CM | POA: Diagnosis not present

## 2019-07-28 DIAGNOSIS — Z90711 Acquired absence of uterus with remaining cervical stump: Secondary | ICD-10-CM | POA: Insufficient documentation

## 2019-07-28 DIAGNOSIS — Z833 Family history of diabetes mellitus: Secondary | ICD-10-CM | POA: Insufficient documentation

## 2019-07-28 DIAGNOSIS — Z803 Family history of malignant neoplasm of breast: Secondary | ICD-10-CM | POA: Insufficient documentation

## 2019-07-28 DIAGNOSIS — M1990 Unspecified osteoarthritis, unspecified site: Secondary | ICD-10-CM | POA: Insufficient documentation

## 2019-07-28 DIAGNOSIS — Z8051 Family history of malignant neoplasm of kidney: Secondary | ICD-10-CM | POA: Insufficient documentation

## 2019-07-28 DIAGNOSIS — Z800 Family history of malignant neoplasm of digestive organs: Secondary | ICD-10-CM | POA: Insufficient documentation

## 2019-07-28 DIAGNOSIS — Z882 Allergy status to sulfonamides status: Secondary | ICD-10-CM | POA: Diagnosis not present

## 2019-07-28 DIAGNOSIS — R7303 Prediabetes: Secondary | ICD-10-CM

## 2019-07-28 DIAGNOSIS — Z888 Allergy status to other drugs, medicaments and biological substances status: Secondary | ICD-10-CM | POA: Insufficient documentation

## 2019-07-28 LAB — GLUCOSE, POCT (MANUAL RESULT ENTRY): POC Glucose: 126 mg/dl — AB (ref 70–99)

## 2019-07-28 NOTE — Progress Notes (Signed)
Patient ID: Robin [name], female    DOB: Oct 22, 1955  MRN: [id number]  CC: Diabetes and Hypertension   Subjective: Robin [name] is a 63 y.o. female who presents for chronic ds management.  Her concerns today include:  Patient with history ofHTN, pre-DM,OSA, fibromyalgia, depression, obesity, GERD, hypothyroid, OA of multiple js (hands, knees, shoulders  Pituitary Adenoma:  Saw Dr. Salomon Fick, the neurosurgeon, at [facility] on 07/10/2019.  He apparently was able to see the report of the MRI that we did recently and felt that everything was stable.  He recommends repeat scan in 5 years.  Patient still feels that something is going on affecting vision on the right side and also her ear on the right side.  Reports some decreased hearing in the ear.  She has seen ENT recently and he looked in the ear and did not see anything to explain her symptoms.  She also saw an ophthalmologist recently and had some studies done on the eyes for the vision changes that she reported to me on last visit.  She has not gotten the results of the testing that were done on the eyes as yet.  Cervical Radiculopathy: I referred her to neurosurgery for this.  However she states that Dr. Salomon Fick did not address that he just will address the issue with pituitary adenoma.  She would like to go to someone locally here in [place].     Still has some issues with shortness of breath and dyspnea on exertion.  She tells me that her pulmonary doctor does not think there is a pulmonary issue and she also recently saw her ENT.  She would like to go back to cardiology Dr. Gwenlyn Found.  -Of note she is gained 14 pounds since August.  She feels her weight may be playing a role in terms of the shortness of breath.  She also admits to being depressed.  Going through some marital discourse.  She still sees her counselor.  She is on Cymbalta.  Itch rash RT elbow.  Sonmetimes does no bother her and then other times it itches a lot.  Not sure of  the cause.  At one point she thought it may have been the cholesterol medication.  Today she comes in in a wheelchair.  She tells me that her balance has been off and she has had some near falls.  Of note the last MRI of her head was back in July.  Patient Active Problem List   Diagnosis Date Noted  . Multilevel degenerative disc disease 10/31/2018  . Abdominal pain 10/28/2018  . Change in bowel function 10/28/2018  . Gastritis and gastroduodenitis 04/26/2018  . Atrophic vaginitis 04/01/2018  . Diabetes mellitus ([place]) 03/31/2018  . Upper airway cough syndrome 02/08/2018  . Dysphagia 12/14/2017  . 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 Urinary incontinence 07/23/2017  . Early satiety 06/16/2017  . Hx of iron deficiency anemia 05/28/2017  . Throat pain in adult 04/05/2017  . Prediabetes 02/04/2017  . Dyspnea on exertion 12/23/2016  . Chronic allergic rhinitis 12/23/2016  . Hemoptysis 12/23/2016  . Fibromyalgia 08/18/2016  . Chronic lymphocytic thyroiditis 08/18/2016  . Depression 04/01/2016  . OSA (obstructive sleep apnea) 04/01/2016  . Essential hypertension 12/09/2015  . Hypothyroidism 12/09/2015  . Morbid obesity due to excess calories ([place]) 12/09/2015  . Constipation 05/27/2015  . Fatty liver 05/27/2015  . Abdominal pain, chronic, epigastric 07/04/2012  . Anxiety 06/09/2012  . History of gastroesophageal reflux (GERD) 06/09/2012  . Hyperlipidemia 06/09/2012  .  Refusal of blood transfusions as patient is Jehovah's Witness 06/09/2012  . 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 Abnormal small bowel biopsy 09/18/2011  . Lactose intolerance 09/18/2011  . GERD 01/21/2010     Current Outpatient Medications on File Prior to Visit  Medication Sig Dispense Refill  . ACCU-CHEK AVIVA PLUS test strip USE AS DIRECTED 100 each 12  . acetaminophen (TYLENOL) 500 MG tablet Take 1,000 mg by mouth 2 (two) times daily.     Marland Kitchen amitriptyline (ELAVIL) 50 MG tablet TAKE 1 TABLET BY MOUTH AT BEDTIME.  30 tablet 2  . amLODipine (NORVASC) 5 MG tablet Take 1 tablet (5 mg total) by mouth daily. *NEEDS OFFICE VISIT-2ND ATTEMPT* 15 tablet 0  . aspirin EC 81 MG tablet Take 81 mg by mouth at bedtime.    Marland Kitchen atorvastatin (LIPITOR) 10 MG tablet Take 1 tablet (10 mg total) by mouth daily. 90 tablet 3  . buPROPion (WELLBUTRIN XL) 300 MG 24 hr tablet TAKE 1 TABLET (300 MG TOTAL) BY MOUTH EVERY MORNING. 30 tablet 2  . calcium carbonate (TUMS - DOSED IN MG ELEMENTAL CALCIUM) 500 MG chewable tablet Chew 2 tablets by mouth at bedtime.    . celecoxib (CELEBREX) 100 MG capsule Take 100 mg by mouth 2 (two) times daily.    . cetirizine (ZYRTEC) 10 MG tablet Take 1 tablet (10 mg total) by mouth daily. 30 tablet 1  . chlorpheniramine (CHLOR-TRIMETON) 4 MG tablet Take 4 mg by mouth every 4 (four) hours as needed for allergies.     Marland Kitchen CINNAMON PO Take 1 capsule by mouth daily.    Marland Kitchen DEXILANT 60 MG capsule TAKE 1 CAPSULE (60 MG TOTAL) BY MOUTH DAILY. 30 capsule 11  . diclofenac sodium (VOLTAREN) 1 % GEL Apply 2-4 g topically 4 (four) times daily as needed (PAIN.).    Marland Kitchen DULoxetine (CYMBALTA) 30 MG capsule Take 1 capsule (30 mg total) by mouth daily. (Patient taking differently: Take 30 mg by mouth at bedtime. ) 30 capsule 6  . fexofenadine (ALLEGRA) 180 MG tablet Take 180 mg by mouth daily as needed for allergies or rhinitis.    . fluticasone (FLONASE) 50 MCG/ACT nasal spray Place 1 spray into both nostrils daily. 16 g 1  . furosemide (LASIX) 40 MG tablet TAKE 1/2 TO 1 TABLET BY MOUTH DAILY FOR LOWER EXTREMITY SWELLING 30 tablet 2  . gabapentin (NEURONTIN) 300 MG capsule TAKE 1 CAPSULE (300 MG TOTAL) BY MOUTH 4 (FOUR) TIMES DAILY. 120 capsule 0  . glucose blood (ONETOUCH ULTRA) test strip Use as instructed 100 each 12  . glucose blood test strip Accu-Chek Aviva Plus test strips    . hydrALAZINE (APRESOLINE) 50 MG tablet Take 2 tablets (100 mg total) by mouth 3 (three) times daily. 180 tablet 5  . levothyroxine (SYNTHROID)  88 MCG tablet TAKE 1 TABLET BY MOUTH DAILY. 30 tablet 2  . LINZESS 290 MCG CAPS capsule TAKE 1 CAPSULE BY MOUTH DAILY BEFORE BREAKFAST. 30 capsule 5  . metFORMIN (GLUCOPHAGE) 500 MG tablet TAKE 1/2 TABLET DAILY WITH BREAKFAST 15 tablet 2  . Metoprolol Tartrate 75 MG TABS TAKE 1 TABLET BY MOUTH TWICE DAILY. 60 tablet 2  . montelukast (SINGULAIR) 10 MG tablet TAKE 1 TABLET BY MOUTH AT BEDTIME. 30 tablet 2  . Multiple Vitamin (MULTIVITAMIN WITH MINERALS) TABS tablet Take 1 tablet by mouth daily. Alive Women's Multivitamin    . Omega-3 Fatty Acids (FISH OIL) 1000 MG CAPS Take 1,000 mg by mouth every evening.    Marland Kitchen  OVER THE COUNTER MEDICATION Take 1 capsule by mouth daily. BEET ROOT    . oxyCODONE-acetaminophen (PERCOCET) 10-325 MG tablet Take 1 tablet by mouth 2 (two) times daily.     . polyethylene glycol-electrolytes (TRILYTE) 420 g solution Take 4,000 mLs by mouth as directed. (Patient not taking: Reported on 07/03/2019) 4000 mL 0  . PREMARIN vaginal cream Place 1 Applicatorful vaginally at bedtime.   3  . PROVENTIL HFA 108 (90 Base) MCG/ACT inhaler INHALE 2 PUFFS INTO THE LUNGS EVERY 6 HOURS AS NEEDED FOR WHEEZING OR SHORTNESS OF BREATH. (Patient taking differently: Inhale 2 puffs into the lungs every 6 (six) hours as needed for wheezing or shortness of breath. ) 6.7 g 2  . sodium chloride (OCEAN) 0.65 % SOLN nasal spray Place 2 sprays into both nostrils every 4 (four) hours as needed for congestion.    Marland Kitchen spironolactone (ALDACTONE) 25 MG tablet Take 1 tablet (25 mg total) by mouth daily. 30 tablet 2  . vitamin B-12 (CYANOCOBALAMIN) 1000 MCG tablet Take 1,000 mcg by mouth daily.     No current facility-administered medications on file prior to visit.     Allergies  Allergen Reactions  . Celexa [Citalopram Hydrobromide] Other (See Comments)    "Made me feel out of it"  . Gabapentin     Contributed to lower extremity edema  . Norvasc [Amlodipine Besylate]     Edema in lower extremities  .  Diflucan [Fluconazole] Rash    Social History   Socioeconomic History  . Marital status: Married    Spouse name: Not on file  . Number of children: 4  . Years of education: 10  . Highest education level: Not on file  Occupational History    Employer: UNEMPLOYED  Social Needs  . Financial resource strain: Not on file  . Food insecurity    Worry: Not on file    Inability: Not on file  . Transportation needs    Medical: Not on file    Non-medical: Not on file  Tobacco Use  . Smoking status: Former Smoker    Packs/day: 0.50    Years: 10.00    Pack years: 5.00    Types: Cigarettes    Start date: 07/14/1975    Quit date: 04/04/1993    Years since quitting: 26.3  . Smokeless tobacco: Never Used  . Tobacco comment: 17 yrs ago  Substance and Sexual Activity  . Alcohol use: No    Alcohol/week: 0.0 standard drinks  . Drug use: No  . Sexual activity: Not on file  Lifestyle  . Physical activity    Days per week: Not on file    Minutes per session: Not on file  . Stress: Not on file  Relationships  . Social Herbalist on phone: Not on file    Gets together: Not on file    Attends religious service: Not on file    Active member of club or organization: Not on file    Attends meetings of clubs or organizations: Not on file    Relationship status: Not on file  . Intimate partner violence    Fear of current or ex partner: Not on file    Emotionally abused: Not on file    Physically abused: Not on file    Forced sexual activity: Not on file  Other Topics Concern  . Not on file  Social History Narrative   Lives with husband in an apartment on the first floor.  Has 4 children.     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  Trying to get disability.  Formerly worked as a Teacher, early years/pre.  Education: 11th grade.      [facility] (12/23/16):   Originally from [place]. She was raised in [place]. 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 No pets currently. No bird exposure. She does have mold in her current home in the bathroom, laundry room, & master bedroom.     Family History  Problem Relation Age of Onset  . Kidney cancer Father   . Hypertension Father   . Hyperlipidemia Father   . Sleep apnea Father   . Diabetes Mother   . Hypertension Mother   . Heart Problems Mother   . Hyperlipidemia Mother   . Asthma Mother   . Sleep apnea Mother   . Liver disease Brother   . Arthritis Brother   . Hypertension Sister   . Allergic rhinitis Sister   . Stomach cancer Other        aunt  . Breast cancer Maternal Grandmother   . Kidney disease Maternal Grandfather   . Breast cancer Paternal Grandmother   . Hypertension Brother   . Hyperlipidemia Brother   . Hypertension Brother   . Hypertension Sister   . Hyperlipidemia Sister   . Lupus Maternal Aunt   . Colon cancer Neg Hx   . Eczema Neg Hx   . Immunodeficiency Neg Hx   . Urticaria Neg Hx     Past Surgical History:  Procedure Laterality Date  . ABDOMINAL HYSTERECTOMY    . ABDOMINAL HYSTERECTOMY  02/18/2000  . CARDIAC CATHETERIZATION  08/18/2003   normal L main/LAD/L Cfx/RCA (Dr. Adora Fridge)  . COLONOSCOPY  2006   Dr. [name], hyperplastic polyps  . COLONOSCOPY  08/06/11   abnormal terminal ileum for 10cm, erosions, geographical ulceration. Bx small bowel mucosa with prominent intramucosal lymphoid aggregates, slightly inflammed  . DIRECT LARYNGOSCOPY  03/15/2012   Procedure: DIRECT LARYNGOSCOPY;  Surgeon: [name], MD;  Location: [facility];  Service: ENT;  Laterality: N/A; Dr. Wolicki--:>no foreign body seen. normal esophagus to 40cm  . ESOPHAGEAL DILATION  04/17/2015   Procedure: ESOPHAGEAL DILATION;  Surgeon: [name], MD;  [facility];  Service: Endoscopy;;  . ESOPHAGOGASTRODUODENOSCOPY  08/23/2008   [id number] distal  esophageal erosions consistent with mild erosive reflux esophagitis, otherwise unremarkable esophagus/ Tiny antral erosions of doubtful clinical significance, otherwise normal stomach, patent pylorus, normal D1 and D2  . ESOPHAGOGASTRODUODENOSCOPY  08/06/11   small hh, noncritical Schatzki's ring s/p 40 F  . ESOPHAGOGASTRODUODENOSCOPY N/A 04/17/2015   [id number] s/p dilation  . ESOPHAGOGASTRODUODENOSCOPY (EGD) WITH PROPOFOL N/A 01/20/2018   Dr. [name]: Erosive gastropathy, normal-appearing esophagus status post empiric dilation.  Chronic gastritis, no H. pylori.  . ESOPHAGOSCOPY  03/15/2012   Procedure: ESOPHAGOSCOPY;  Surgeon: [name], MD;  Location: [facility];  Service: ENT;  Laterality: N/A;  . KNEE ARTHROSCOPY  04/27/2001  . MALONEY DILATION N/A 01/20/2018   Procedure: Venia Minks DILATION;  Surgeon: [name], MD;  [facility];  Service: Endoscopy;  Laterality: N/A;  . [place]  2003   negative bruce protocol exercise tress test; EF 68%; intermediate risk study due to evidence of anterior wall ischemia extending  from mid-ventricle to apex  . PITUITARY SURGERY  06/2012   benign tumor, surgeon at [facility]  . RECTOCELE REPAIR    . TRANSTHORACIC ECHOCARDIOGRAM  2003   EF normal  . VAGINAL PROLAPSE REPAIR    . VIDEO BRONCHOSCOPY Bilateral 03/08/2017   Procedure: VIDEO BRONCHOSCOPY WITHOUT FLUORO;  Surgeon: Javier Glazier, MD;  Location: [place];  Service: Cardiopulmonary;  Laterality: Bilateral;    ROS: Review of Systems Negative except as stated above  PHYSICAL EXAM: BP 138/65   Pulse 64   Temp 99.3 F (37.4 C) (Oral)   Resp 16   SpO2 95%   Wt Readings from Last 3 Encounters:  07/03/19 (!) 345 lb (156.5 kg)  06/01/19 (!) 331 lb (150.1 kg)  05/11/19 (!) 331 lb 6.4 oz (150.3 kg)    Physical Exam  General appearance - alert, well appearing, and in no distress Mental status - normal mood, behavior, speech, dress, motor activity, and thought  processes Eyes - pupils equal and reactive, extraocular eye movements intact Ears - bilateral TM's and external ear canals normal Mouth - mucous membranes moist, pharynx normal without lesions Neck - supple, no significant adenopathy Chest - clear to auscultation, no wheezes, rales or rhonchi, symmetric air entry Heart - normal rate, regular rhythm, normal S1, S2, no murmurs, rubs, clicks or gallops Extremities - peripheral pulses normal, no pedal edema, no clubbing or cyanosis MSK: Power in the lower extremities 5 out of 5.  She is in a wheelchair.  We did not have her stand.  CMP Latest Ref Rng & Units 06/01/2019 04/26/2019 04/10/2019  Glucose 70 - 99 mg/dL 123(H) - -  BUN 6 - 20 mg/dL 14 - -  Creatinine 0.44 - 1.00 mg/dL 0.78 0.90 -  Sodium 135 - 145 mmol/L 138 - -  Potassium 3.5 - 5.1 mmol/L 4.2 - -  Chloride 98 - 111 mmol/L 102 - -  CO2 22 - 32 mmol/L 28 - -  Calcium 8.9 - 10.3 mg/dL 9.0 - -  Total Protein 6.5 - 8.1 g/dL 7.3 - 7.0  Total Bilirubin 0.3 - 1.2 mg/dL 0.7 - <0.2  Alkaline Phos 38 - 126 U/L 61 - 79  AST 15 - 41 U/L 19 - 20  ALT 0 - 44 U/L 25 - 19   Lipid Panel     Component Value Date/Time   CHOL 197 04/10/2019 1545   TRIG 116 04/10/2019 1545   HDL 60 04/10/2019 1545   CHOLHDL 3.3 04/10/2019 1545   CHOLHDL 2.3 12/16/2015 0950   VLDL 13 12/16/2015 0950   LDLCALC 114 (H) 04/10/2019 1545    CBC    Component Value Date/Time   WBC 7.9 06/01/2019 1052   RBC 4.20 06/01/2019 1052   HGB 12.0 06/01/2019 1052   HGB 12.0 01/20/2017 1001   HCT 40.5 06/01/2019 1052   HCT 38.2 01/20/2017 1001   HCT 41 06/25/2011 1053   PLT 310 06/01/2019 1052   PLT 356 01/20/2017 1001   MCV 96.4 06/01/2019 1052   MCV 90 01/20/2017 1001   MCV 91.1 06/25/2011 1053   MCH 28.6 06/01/2019 1052   MCHC 29.6 (L) 06/01/2019 1052   RDW 13.3 06/01/2019 1052   RDW 13.1 01/20/2017 1001   LYMPHSABS 3.3 06/01/2019 1052   LYMPHSABS 3.4 (H) 01/20/2017 1001   MONOABS 1.0 06/01/2019 1052   EOSABS  0.1 06/01/2019 1052   EOSABS 0.1 01/20/2017 1001   BASOSABS 0.0 06/01/2019 1052   BASOSABS 0.0 01/20/2017 1001  Results for orders placed or performed in visit on 07/28/19  POCT glucose (manual entry)  Result Value Ref Range   POC Glucose 126 (A) 70 - 99 mg/dl    ASSESSMENT AND PLAN: 1. Cervical radiculopathy We will try to get her in with a neurosurgeon here in [facility] for evaluation and management as this is still bothersome for her. - Ambulatory referral to Neurosurgery  2. Pituitary adenoma [facility]) Seen by the neurosurgeon at [facility].  He recommends repeat MRI in 5 years.  Apparently he was able to see the report of the MRI of the brain that we did back in July that indicated no interval enlargement of the 13 mm pituitary adenoma  3. Decreased hearing of right ear - Ambulatory referral to Audiology  4. DOE (dyspnea on exertion) She will call to schedule a follow-up with Dr. Gwenlyn Found Strongly encouraged weight loss.  She was in the weight management program.  However she is going through some stress at home and I think this is contributed to the weight gain  5. Prediabetes - POCT glucose (manual entry)  6. Balance problem - Ambulatory referral to Physical Therapy  7. Need for influenza vaccination Given      Patient was given the opportunity to ask questions.  Patient verbalized understanding of the plan and was able to repeat key elements of the plan.   Orders Placed This Encounter  Procedures  . POCT glucose (manual entry)     Requested Prescriptions    No prescriptions requested or ordered in this encounter    No follow-ups on file.  Karle Plumber, MD, FACP

## 2019-07-28 NOTE — Progress Notes (Signed)
Patient presents for vaccination against influenza per orders of Dr. Johnson. Consent given. Counseling provided. No contraindications exists. Vaccine administered without incident.   

## 2019-07-28 NOTE — Patient Instructions (Addendum)
Fall Prevention in the Home, Adult Falls can cause injuries. They can happen to people of all ages. There are many things you can do to make your home safe and to help prevent falls. Ask for help when making these changes, if needed. What actions can I take to prevent falls? General Instructions  Use good lighting in all rooms. Replace any light bulbs that burn out.  Turn on the lights when you go into a dark area. Use night-lights.  Keep items that you use often in easy-to-reach places. Lower the shelves around your home if necessary.  Set up your furniture so you have a clear path. Avoid moving your furniture around.  Do not have throw rugs and other things on the floor that can make you trip.  Avoid walking on wet floors.  If any of your floors are uneven, fix them.  Add color or contrast paint or tape to clearly mark and help you see: ? Any grab bars or handrails. ? First and last steps of stairways. ? Where the edge of each step is.  If you use a stepladder: ? Make sure that it is fully opened. Do not climb a closed stepladder. ? Make sure that both sides of the stepladder are locked into place. ? Ask someone to hold the stepladder for you while you use it.  If there are any pets around you, be aware of where they are. What can I do in the bathroom?      Keep the floor dry. Clean up any water that spills onto the floor as soon as it happens.  Remove soap buildup in the tub or shower regularly.  Use non-skid mats or decals on the floor of the tub or shower.  Attach bath mats securely with double-sided, non-slip rug tape.  If you need to sit down in the shower, use a plastic, non-slip stool.  Install grab bars by the toilet and in the tub and shower. Do not use towel bars as grab bars. What can I do in the bedroom?  Make sure that you have a light by your bed that is easy to reach.  Do not use any sheets or blankets that are too big for your bed. They should not  hang down onto the floor.  Have a firm chair that has side arms. You can use this for support while you get dressed. What can I do in the kitchen?  Clean up any spills right away.  If you need to reach something above you, use a strong step stool that has a grab bar.  Keep electrical cords out of the way.  Do not use floor polish or wax that makes floors slippery. If you must use wax, use non-skid floor wax. What can I do with my stairs?  Do not leave any items on the stairs.  Make sure that you have a light switch at the top of the stairs and the bottom of the stairs. If you do not have them, ask someone to add them for you.  Make sure that there are handrails on both sides of the stairs, and use them. Fix handrails that are broken or loose. Make sure that handrails are as long as the stairways.  Install non-slip stair treads on all stairs in your home.  Avoid having throw rugs at the top or bottom of the stairs. If you do have throw rugs, attach them to the floor with carpet tape.  Choose a carpet that does   not hide the edge of the steps on the stairway.  Check any carpeting to make sure that it is firmly attached to the stairs. Fix any carpet that is loose or worn. What can I do on the outside of my home?  Use bright outdoor lighting.  Regularly fix the edges of walkways and driveways and fix any cracks.  Remove anything that might make you trip as you walk through a door, such as a raised step or threshold.  Trim any bushes or trees on the path to your home.  Regularly check to see if handrails are loose or broken. Make sure that both sides of any steps have handrails.  Install guardrails along the edges of any raised decks and porches.  Clear walking paths of anything that might make someone trip, such as tools or rocks.  Have any leaves, snow, or ice cleared regularly.  Use sand or salt on walking paths during winter.  Clean up any spills in your garage right  away. This includes grease or oil spills. What other actions can I take?  Wear shoes that: ? Have a low heel. Do not wear high heels. ? Have rubber bottoms. ? Are comfortable and fit you well. ? Are closed at the toe. Do not wear open-toe sandals.  Use tools that help you move around (mobility aids) if they are needed. These include: ? Canes. ? Walkers. ? Scooters. ? Crutches.  Review your medicines with your doctor. Some medicines can make you feel dizzy. This can increase your chance of falling. Ask your doctor what other things you can do to help prevent falls. Where to find more information  Centers for Disease Control and Prevention, STEADI: https://garcia.biz/  [employer] on Aging: BrainJudge.co.uk Contact a doctor if:  You are afraid of falling at home.  You feel weak, drowsy, or dizzy at home.  You fall at home. Summary  There are many simple things that you can do to make your home safe and to help prevent falls.  Ways to make your home safe include removing tripping hazards and installing grab bars in the bathroom.  Ask for help when making these changes in your home. This information is not intended to replace advice given to you by your health care provider. Make sure you discuss any questions you have with your health care provider. Document Released: 07/18/2009 Document Revised: 01/12/2019 Document Reviewed: 05/06/2017 Elsevier Patient Education  [place].   Influenza Virus Vaccine injection (Fluarix) What is this medicine? INFLUENZA VIRUS VACCINE (in floo EN zuh VAHY ruhs vak SEEN) helps to reduce the risk of getting influenza also known as the flu. This medicine may be used for other purposes; ask your health care provider or pharmacist if you have questions. COMMON BRAND NAME(S): Fluarix, Fluzone What should I tell my health care provider before I take this medicine? They need to know if you have any of these conditions:   bleeding disorder like hemophilia  fever or infection  Guillain-Barre syndrome or other neurological problems  immune system problems  infection with the human immunodeficiency virus (HIV) or AIDS  low blood platelet counts  multiple sclerosis  an unusual or allergic reaction to influenza virus vaccine, eggs, chicken proteins, latex, gentamicin, other medicines, foods, dyes or preservatives  pregnant or trying to get pregnant  breast-feeding How should I use this medicine? This vaccine is for injection into a muscle. It is given by a health care professional. A copy of Vaccine Information Statements  will be given before each vaccination. Read this sheet carefully each time. The sheet may change frequently. Talk to your pediatrician regarding the use of this medicine in children. Special care may be needed. Overdosage: If you think you have taken too much of this medicine contact a poison control center or emergency room at once. NOTE: This medicine is only for you. Do not share this medicine with others. What if I miss a dose? This does not apply. What may interact with this medicine?  chemotherapy or radiation therapy  medicines that lower your immune system like etanercept, anakinra, infliximab, and adalimumab  medicines that treat or prevent blood clots like warfarin  phenytoin  steroid medicines like prednisone or cortisone  theophylline  vaccines This list may not describe all possible interactions. Give your health care provider a list of all the medicines, herbs, non-prescription drugs, or dietary supplements you use. Also tell them if you smoke, drink alcohol, or use illegal drugs. Some items may interact with your medicine. What should I watch for while using this medicine? Report any side effects that do not go away within 3 days to your doctor or health care professional. Call your health care provider if any unusual symptoms occur within 6 weeks of receiving  this vaccine. You may still catch the flu, but the illness is not usually as bad. You cannot get the flu from the vaccine. The vaccine will not protect against colds or other illnesses that may cause fever. The vaccine is needed every year. What side effects may I notice from receiving this medicine? Side effects that you should report to your doctor or health care professional as soon as possible:  allergic reactions like skin rash, itching or hives, swelling of the face, lips, or tongue Side effects that usually do not require medical attention (report to your doctor or health care professional if they continue or are bothersome):  fever  headache  muscle aches and pains  pain, tenderness, redness, or swelling at site where injected  weak or tired This list may not describe all possible side effects. Call your doctor for medical advice about side effects. You may report side effects to FDA at 1-800-FDA-1088. Where should I keep my medicine? This vaccine is only given in a clinic, pharmacy, doctor's office, or other health care setting and will not be stored at home. NOTE: This sheet is a summary. It may not cover all possible information. If you have questions about this medicine, talk to your doctor, pharmacist, or health care provider.  2020 Elsevier/Gold Standard (2008-04-18 09:30:40)

## 2019-08-01 DIAGNOSIS — H53453 Other localized visual field defect, bilateral: Secondary | ICD-10-CM | POA: Diagnosis not present

## 2019-08-07 ENCOUNTER — Other Ambulatory Visit: Payer: Self-pay | Admitting: Internal Medicine

## 2019-08-07 DIAGNOSIS — I10 Essential (primary) hypertension: Secondary | ICD-10-CM

## 2019-08-08 MED FILL — FUROSEMIDE 40 MG TAB: 40 | 90 days supply | Qty: 90 | Fill #0

## 2019-08-09 ENCOUNTER — Other Ambulatory Visit: Payer: Self-pay | Admitting: Cardiovascular Disease

## 2019-08-09 MED FILL — hydrALAZINE HCL 50 MG TABS: 50 | 5 days supply | Qty: 30 | Fill #0

## 2019-08-15 DIAGNOSIS — M25551 Pain in right hip: Secondary | ICD-10-CM | POA: Diagnosis not present

## 2019-08-15 DIAGNOSIS — M79641 Pain in right hand: Secondary | ICD-10-CM | POA: Diagnosis not present

## 2019-08-15 DIAGNOSIS — M545 Low back pain: Secondary | ICD-10-CM | POA: Diagnosis not present

## 2019-08-15 DIAGNOSIS — M25552 Pain in left hip: Secondary | ICD-10-CM | POA: Diagnosis not present

## 2019-08-16 ENCOUNTER — Other Ambulatory Visit: Payer: Self-pay | Admitting: Internal Medicine

## 2019-08-16 DIAGNOSIS — J309 Allergic rhinitis, unspecified: Secondary | ICD-10-CM

## 2019-08-16 MED FILL — DULoxetine HCL 30 MG CPEP: 30 | 30 days supply | Qty: 30 | Fill #4

## 2019-08-16 MED FILL — CELECOXIB 100 MG CAP: 100 | 30 days supply | Qty: 60 | Fill #3

## 2019-08-16 MED FILL — DEXILANT DR 60 MG CAPSULE: 60 | 30 days supply | Qty: 30 | Fill #2

## 2019-08-16 MED FILL — MONTELUKAST SOD 10 MG TAB: 10 | 30 days supply | Qty: 30 | Fill #0

## 2019-08-16 MED FILL — ATORVASTATIN 10 MG TABLET: 10 | 30 days supply | Qty: 30 | Fill #4

## 2019-08-16 MED FILL — METOPROLOL TARTRATE 75 MG T: 75 | 30 days supply | Qty: 60 | Fill #0

## 2019-08-17 MED FILL — GABAPENTIN 300 MG CAPSULE: 300 | 30 days supply | Qty: 120 | Fill #0

## 2019-08-21 DIAGNOSIS — M170 Bilateral primary osteoarthritis of knee: Secondary | ICD-10-CM | POA: Insufficient documentation

## 2019-08-21 DIAGNOSIS — M25512 Pain in left shoulder: Secondary | ICD-10-CM | POA: Insufficient documentation

## 2019-08-21 DIAGNOSIS — M160 Bilateral primary osteoarthritis of hip: Secondary | ICD-10-CM | POA: Insufficient documentation

## 2019-08-21 DIAGNOSIS — I10 Essential (primary) hypertension: Secondary | ICD-10-CM | POA: Diagnosis not present

## 2019-08-21 DIAGNOSIS — D497 Neoplasm of unspecified behavior of endocrine glands and other parts of nervous system: Secondary | ICD-10-CM | POA: Insufficient documentation

## 2019-08-21 DIAGNOSIS — Z6825 Body mass index (BMI) 25.0-25.9, adult: Secondary | ICD-10-CM | POA: Diagnosis not present

## 2019-08-21 DIAGNOSIS — M4722 Other spondylosis with radiculopathy, cervical region: Secondary | ICD-10-CM | POA: Diagnosis not present

## 2019-08-24 MED FILL — AMITRIPTYLINE HCL 50 MG TAB: 50 | 30 days supply | Qty: 30 | Fill #0

## 2019-08-29 ENCOUNTER — Ambulatory Visit: Payer: Medicaid Other | Admitting: Cardiovascular Disease

## 2019-08-29 ENCOUNTER — Encounter: Payer: Self-pay | Admitting: Cardiovascular Disease

## 2019-08-29 ENCOUNTER — Other Ambulatory Visit: Payer: Self-pay

## 2019-08-29 DIAGNOSIS — E782 Mixed hyperlipidemia: Secondary | ICD-10-CM | POA: Diagnosis not present

## 2019-08-29 DIAGNOSIS — R0600 Dyspnea, unspecified: Secondary | ICD-10-CM | POA: Diagnosis not present

## 2019-08-29 DIAGNOSIS — I10 Essential (primary) hypertension: Secondary | ICD-10-CM

## 2019-08-29 DIAGNOSIS — G4733 Obstructive sleep apnea (adult) (pediatric): Secondary | ICD-10-CM | POA: Diagnosis not present

## 2019-08-29 DIAGNOSIS — R0609 Other forms of dyspnea: Secondary | ICD-10-CM

## 2019-08-29 MED ORDER — ATORVASTATIN CALCIUM 20 MG PO TABS: 10.0000 mg | ORAL_TABLET | Freq: Every day | ORAL | 3 refills | Status: DC

## 2019-08-29 NOTE — Assessment & Plan Note (Signed)
History of dyspnea on exertion most likely related to her morbid obesity.

## 2019-08-29 NOTE — Assessment & Plan Note (Signed)
History of essential hypertension blood pressure measured today at 143/81.  She is on amlodipine, hydralazine, spironolactone, and metoprolol.

## 2019-08-29 NOTE — Assessment & Plan Note (Signed)
History of hyperlipidemia on low-dose statin therapy with lipid profile performed 04/10/2019 revealing total cholesterol of 197, LDL 114 and HDL of 60.  I am going to increase her atorvastatin from 10 to 20 mg a day and we will recheck in 2 months.

## 2019-08-29 NOTE — Patient Instructions (Signed)
Medication Instructions:  Increase Lipitor to 20mg  Daily  If you need a refill on your cardiac medications before your next appointment, please call your pharmacy.   Lab work: Lipids and Hepatic Function in 2 months If you have labs (blood work) drawn today and your tests are completely normal, you will receive your results only by: [place] (if you have MyChart) OR A paper copy in the mail If you have any lab test that is abnormal or we need to change your treatment, we will call you to review the results.  Testing/Procedures: NONE  Follow-Up: At [facility], you and your health needs are our priority.  As part of our continuing mission to provide you with exceptional heart care, we have created designated Provider Care Teams.  These Care Teams include your primary Cardiologist (physician) and Advanced Practice Providers (APPs -  Physician Assistants and Nurse Practitioners) who all work together to provide you with the care you need, when you need it. You may see Dr Gwenlyn Found or one of the following Advanced Practice Providers on your designated Care Team:    Kerin Ransom, PA-C  [place], [place]  Coletta Memos, [place]  Your physician wants you to follow-up in: 2 months.

## 2019-08-29 NOTE — Assessment & Plan Note (Signed)
History of obstructive sleep apnea currently not wearing her CPAP which needs to be updated.

## 2019-08-29 NOTE — Addendum Note (Signed)
Addended by: Cain Sieve on: 08/29/2019 03:00 PM   Modules accepted: Orders

## 2019-08-29 NOTE — Progress Notes (Signed)
08/29/2019 Robin [name]   September 07, 1956  [id number]  Primary Physician Robin Pier, MD Primary Cardiologist: Robin Harp MD Robin [name], [place]  HPI:  Robin [name] is a 62 y.o. morbidly overweight married African-American female mother of 43, grandmother of 7 grandchildren who is accompanied by her husband Robin [name].  She was referred by her PCP, Robin [name], for increasing dyspnea on exertion of unclear etiology.  I last saw her in the office 04/15/2018. I performed cardiac catheterization on her 08/17/2002 for chest pain, shortness of breath and a false positive Cardiolite suggesting anterior ischemia versus breast attenuation.  Her coronary arteries were completely normal as was her LV function.  She is noticed increasing dyspnea on exertion over the last several months to years.  She has gained a significant amount of weight as well however.  She is being evaluated by Robin [name] for pulmonary etiology.  Since I saw her a year ago she has gained 10 pounds.  She continues to be dyspneic.  She denies chest pain.  Her lipid profile performed recently showed an LDL of 114 on atorvastatin 10 mg a day which we will adjust in order to get her to guidelines for primary prevention.  She does have obstructive sleep apnea but is currently not wearing her CPAP because the settings are out of date.      Current Meds  Medication Sig  . ACCU-CHEK AVIVA PLUS test strip USE AS DIRECTED  . acetaminophen (TYLENOL) 500 MG tablet Take 1,000 mg by mouth 2 (two) times daily.   Marland Kitchen amitriptyline (ELAVIL) 50 MG tablet TAKE 1 TABLET BY MOUTH AT BEDTIME.  Marland Kitchen amLODipine (NORVASC) 5 MG tablet Take 1 tablet (5 mg total) by mouth daily. *NEEDS OFFICE VISIT-2ND ATTEMPT*  . aspirin EC 81 MG tablet Take 81 mg by mouth at bedtime.  Marland Kitchen atorvastatin (LIPITOR) 10 MG tablet Take 1 tablet (10 mg total) by mouth daily.  Marland Kitchen buPROPion (WELLBUTRIN XL) 300 MG 24 hr tablet TAKE 1 TABLET (300 MG TOTAL) BY MOUTH EVERY  MORNING.  . calcium carbonate (TUMS - DOSED IN MG ELEMENTAL CALCIUM) 500 MG chewable tablet Chew 2 tablets by mouth at bedtime.  . celecoxib (CELEBREX) 100 MG capsule Take 100 mg by mouth 2 (two) times daily.  . cetirizine (ZYRTEC) 10 MG tablet Take 1 tablet (10 mg total) by mouth daily.  . chlorpheniramine (CHLOR-TRIMETON) 4 MG tablet Take 4 mg by mouth every 4 (four) hours as needed for allergies.   Marland Kitchen CINNAMON PO Take 1 capsule by mouth daily.  Marland Kitchen DEXILANT 60 MG capsule TAKE 1 CAPSULE (60 MG TOTAL) BY MOUTH DAILY.  Marland Kitchen diclofenac sodium (VOLTAREN) 1 % GEL Apply 2-4 g topically 4 (four) times daily as needed (PAIN.).  Marland Kitchen DULoxetine (CYMBALTA) 30 MG capsule Take 1 capsule (30 mg total) by mouth daily. (Patient taking differently: Take 30 mg by mouth at bedtime. )  . fexofenadine (ALLEGRA) 180 MG tablet Take 180 mg by mouth daily as needed for allergies or rhinitis.  . fluticasone (FLONASE) 50 MCG/ACT nasal spray Place 1 spray into both nostrils daily.  . furosemide (LASIX) 40 MG tablet TAKE 1/2 TO 1 TABLET BY MOUTH DAILY FOR LOWER EXTREMITY SWELLING  . gabapentin (NEURONTIN) 300 MG capsule TAKE 1 CAPSULE (300 MG TOTAL) BY MOUTH 4 (FOUR) TIMES DAILY.  Marland Kitchen glucose blood (ONETOUCH ULTRA) test strip Use as instructed  . glucose blood test strip Accu-Chek Aviva Plus test strips  . hydrALAZINE (  APRESOLINE) 50 MG tablet TAKE 2 TABLETS BY MOUTH 3 TIMES DAILY.  Marland Kitchen levothyroxine (SYNTHROID) 88 MCG tablet TAKE 1 TABLET BY MOUTH DAILY.  Marland Kitchen LINZESS 290 MCG CAPS capsule TAKE 1 CAPSULE BY MOUTH DAILY BEFORE BREAKFAST.  . metFORMIN (GLUCOPHAGE) 500 MG tablet TAKE 1/2 TABLET DAILY WITH BREAKFAST  . Metoprolol Tartrate 75 MG TABS TAKE 1 TABLET BY MOUTH TWICE DAILY.  . montelukast (SINGULAIR) 10 MG tablet TAKE 1 TABLET BY MOUTH AT BEDTIME.  . Multiple Vitamin (MULTIVITAMIN WITH MINERALS) TABS tablet Take 1 tablet by mouth daily. Alive Women's Multivitamin  . Omega-3 Fatty Acids (FISH OIL) 1000 MG CAPS Take 1,000 mg by  mouth every evening.  Marland Kitchen OVER THE COUNTER MEDICATION Take 1 capsule by mouth daily. BEET ROOT  . oxyCODONE-acetaminophen (PERCOCET) 10-325 MG tablet Take 1 tablet by mouth 2 (two) times daily.   . polyethylene glycol-electrolytes (TRILYTE) 420 g solution Take 4,000 mLs by mouth as directed.  Marland Kitchen PREMARIN vaginal cream Place 1 Applicatorful vaginally at bedtime.   Marland Kitchen PROVENTIL HFA 108 (90 Base) MCG/ACT inhaler INHALE 2 PUFFS INTO THE LUNGS EVERY 6 HOURS AS NEEDED FOR WHEEZING OR SHORTNESS OF BREATH. (Patient taking differently: Inhale 2 puffs into the lungs every 6 (six) hours as needed for wheezing or shortness of breath. )  . sodium chloride (OCEAN) 0.65 % SOLN nasal spray Place 2 sprays into both nostrils every 4 (four) hours as needed for congestion.  Marland Kitchen spironolactone (ALDACTONE) 25 MG tablet Take 1 tablet (25 mg total) by mouth daily.  . vitamin B-12 (CYANOCOBALAMIN) 1000 MCG tablet Take 1,000 mcg by mouth daily.     Allergies  Allergen Reactions  . Celexa [Citalopram Hydrobromide] Other (See Comments)    "Made me feel out of it"  . Gabapentin     Contributed to lower extremity edema  . Norvasc [Amlodipine Besylate]     Edema in lower extremities  . Diflucan [Fluconazole] Rash    Social History   Socioeconomic History  . Marital status: Married    Spouse name: Not on file  . Number of children: 4  . Years of education: 10  . Highest education level: Not on file  Occupational History    Employer: UNEMPLOYED  Social Needs  . Financial resource strain: Not on file  . Food insecurity    Worry: Not on file    Inability: Not on file  . Transportation needs    Medical: Not on file    Non-medical: Not on file  Tobacco Use  . Smoking status: Former Smoker    Packs/day: 0.50    Years: 10.00    Pack years: 5.00    Types: Cigarettes    Start date: 07/14/1975    Quit date: 04/04/1993    Years since quitting: 26.4  . Smokeless tobacco: Never Used  . Tobacco comment: 17 yrs ago   Substance and Sexual Activity  . Alcohol use: No    Alcohol/week: 0.0 standard drinks  . Drug use: No  . Sexual activity: Not on file  Lifestyle  . Physical activity    Days per week: Not on file    Minutes per session: Not on file  . Stress: Not on file  Relationships  . Social Herbalist on phone: Not on file    Gets together: Not on file    Attends religious service: Not on file    Active member of club or organization: Not on file    Attends meetings  of clubs or organizations: Not on file    Relationship status: Not on file  . Intimate partner violence    Fear of current or ex partner: Not on file    Emotionally abused: Not on file    Physically abused: Not on file    Forced sexual activity: Not on file  Other Topics Concern  . Not on file  Social History Narrative   Lives with husband in an apartment on the first floor.  Has 4 children.     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  Trying to get disability.  Formerly worked as a Teacher, early years/pre.  Education: 11th grade.       Pulmonary (12/23/16):   Originally from [place] [facility]. She was raised in [place]. 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 No pets currently. No bird exposure. She does have mold in her current home in the bathroom, laundry room, & master bedroom.      Review of Systems: General: negative for chills, fever, night sweats or weight changes.  Cardiovascular: negative for chest pain, dyspnea on exertion, edema, orthopnea, palpitations, paroxysmal nocturnal dyspnea or shortness of breath Dermatological: negative for rash Respiratory: negative for cough or wheezing Urologic: negative for hematuria Abdominal: negative for nausea, vomiting, diarrhea, bright red blood per rectum, melena, or hematemesis Neurologic: negative for visual changes,  syncope, or dizziness All other systems reviewed and are otherwise negative except as noted above.    Blood pressure (!) 143/81, pulse (!) 59, temperature (!) 97.2 F (36.2 C), height 5\' 8"  (1.727 m), weight (!) 346 lb 9.6 oz (157.2 kg), SpO2 96 %.  General appearance: alert and no distress Neck: no adenopathy, no carotid bruit, no JVD, supple, symmetrical, trachea midline and thyroid not enlarged, symmetric, no tenderness/mass/nodules Lungs: clear to auscultation bilaterally Heart: regular rate and rhythm, S1, S2 normal, no murmur, click, rub or gallop Extremities: extremities normal, atraumatic, no cyanosis or edema Pulses: 2+ and symmetric Skin: Skin color, texture, turgor normal. No rashes or lesions Neurologic: Alert and oriented X 3, normal strength and tone. Normal symmetric reflexes. Normal coordination and gait  EKG sinus bradycardia 58 without ST or T wave changes.  I personally reviewed this EKG.  ASSESSMENT AND PLAN:   OSA (obstructive sleep apnea) History of obstructive sleep apnea currently not wearing her CPAP which needs to be updated.  Essential hypertension History of essential hypertension blood pressure measured today at 143/81.  She is on amlodipine, hydralazine, spironolactone, and metoprolol.  Dyspnea on exertion History of dyspnea on exertion most likely related to her morbid obesity.  Hyperlipidemia History of hyperlipidemia on low-dose statin therapy with lipid profile performed 04/10/2019 revealing total cholesterol of 197, LDL 114 and HDL of 60.  I am going to increase her atorvastatin from 10 to 20 mg a day and we will recheck in 2 months.      Robin Harp MD FACP,FACC,FAHA, [facility] 08/29/2019 10:13 AM

## 2019-08-30 ENCOUNTER — Other Ambulatory Visit: Payer: Self-pay | Admitting: Cardiovascular Disease

## 2019-09-04 ENCOUNTER — Ambulatory Visit: Payer: Medicaid Other | Attending: Internal Medicine | Admitting: Physical Therapy

## 2019-09-14 ENCOUNTER — Other Ambulatory Visit: Payer: Self-pay | Admitting: Cardiovascular Disease

## 2019-09-14 ENCOUNTER — Other Ambulatory Visit: Payer: Self-pay | Admitting: Internal Medicine

## 2019-09-14 MED FILL — MONTELUKAST SOD 10 MG TAB: 10 | 30 days supply | Qty: 30 | Fill #1

## 2019-09-14 MED FILL — hydrALAZINE HCL 50 MG TABS: 50 | 5 days supply | Qty: 30 | Fill #0

## 2019-09-14 MED FILL — METOPROLOL TARTRATE 75 MG T: 75 | 30 days supply | Qty: 60 | Fill #1

## 2019-09-14 MED FILL — CELECOXIB 100 MG CAP: 100 | 30 days supply | Qty: 60 | Fill #4

## 2019-09-14 MED FILL — ATORVASTATIN CALCIUM 20 MG: 20 | 30 days supply | Qty: 15 | Fill #0

## 2019-09-14 MED FILL — ACCU-CHEK AVIVA PLUS TEST S: 30 days supply | Qty: 100 | Fill #2

## 2019-09-14 MED FILL — DEXILANT DR 60 MG CAPSULE: 60 | 30 days supply | Qty: 30 | Fill #3

## 2019-09-14 MED FILL — DULoxetine HCL 30 MG CPEP: 30 | 30 days supply | Qty: 30 | Fill #5

## 2019-09-14 NOTE — Telephone Encounter (Signed)
Called and spoke with pt letting her know that we were refilling her gabapentin but needed to schedule f/u in 3 months. Pt verbalized understanding. appt scheduled for pt with MW and Rx sent to preferred pharmacy. Nothing further needed.

## 2019-09-14 NOTE — Telephone Encounter (Signed)
Ok to renew x 3 months then ov to determine whether to continue long term (unless another provider wanting her to be on it for some other reason, in which case they can write for it)

## 2019-09-14 NOTE — Telephone Encounter (Signed)
Received a refill request for patient's gabapentin. Reviewed her chart, she was last seen by MW on 05/11/19 and was advised to follow up with Dr. Erik Obey and follow up here as needed. As of today, she has not followed up here.   Also, gabapentin is listed as an allergy in her chart. States it caused lower extremity swelling. This was added to her chart on 05/03/18.   MW, are you ok with this refill? Please advise.

## 2019-09-15 ENCOUNTER — Ambulatory Visit: Payer: Medicaid Other | Admitting: Physical Therapy

## 2019-09-20 NOTE — Patient Instructions (Signed)
   Your procedure is scheduled on: 09/25/2019  Report to [facility] at   7:00  AM.  Call this number if you have problems the morning of surgery: [phone]   Remember:              Follow Directions on the letter you received from Your Physician's office regarding the Bowel Prep  :  Take these medicines the morning of surgery with A SIP OF WATER: Amlodipine, Bupropion, Celebrex, Dexilant, Hydralazine, Levothyroxine, and metoprolol   Do not wear jewelry, make-up or nail polish.    Do not bring valuables to the hospital.  Contacts, dentures or bridgework may not be worn into surgery.  .   Patients discharged the day of surgery will not be allowed to drive home.     Colonoscopy, Adult, Care After This sheet gives you information about how to care for yourself after your procedure. Your health care provider may also give you more specific instructions. If you have problems or questions, contact your health care provider. What can I expect after the procedure? After the procedure, it is common to have:  A small amount of blood in your stool for 24 hours after the procedure.  Some gas.  Mild abdominal cramping or bloating.  Follow these instructions at home: General instructions   For the first 24 hours after the procedure: ? Do not drive or use machinery. ? Do not sign important documents. ? Do not drink alcohol. ? Do your regular daily activities at a slower pace than normal. ? Eat soft, easy-to-digest foods. ? Rest often.  Take over-the-counter or prescription medicines only as told by your health care provider.  It is up to you to get the results of your procedure. Ask your health care provider, or the department performing the procedure, when your results will be ready. Relieving cramping and bloating  Try walking around when you have cramps or feel bloated.  Apply heat to your abdomen as told by your health care provider. Use a heat source that your health care  provider recommends, such as a moist heat pack or a heating pad. ? Place a towel between your skin and the heat source. ? Leave the heat on for 20-30 minutes. ? Remove the heat if your skin turns bright red. This is especially important if you are unable to feel pain, heat, or cold. You may have a greater risk of getting burned. Eating and drinking  Drink enough fluid to keep your urine clear or pale yellow.  Resume your normal diet as instructed by your health care provider. Avoid heavy or fried foods that are hard to digest.  Avoid drinking alcohol for as long as instructed by your health care provider. Contact a health care provider if:  You have blood in your stool 2-3 days after the procedure. Get help right away if:  You have more than a small spotting of blood in your stool.  You pass large blood clots in your stool.  Your abdomen is swollen.  You have nausea or vomiting.  You have a fever.  You have increasing abdominal pain that is not relieved with medicine. This information is not intended to replace advice given to you by your health care provider. Make sure you discuss any questions you have with your health care provider. Document Released: 05/05/2004 Document Revised: 06/15/2016 Document Reviewed: 12/03/2015 Elsevier Interactive Patient Education  [employer].

## 2019-09-21 ENCOUNTER — Other Ambulatory Visit: Payer: Self-pay | Admitting: Internal Medicine

## 2019-09-21 DIAGNOSIS — M4722 Other spondylosis with radiculopathy, cervical region: Secondary | ICD-10-CM | POA: Diagnosis not present

## 2019-09-21 MED FILL — metFORMIN HCL 500 MG TABS: 500 | 30 days supply | Qty: 15 | Fill #0

## 2019-09-21 MED FILL — GABAPENTIN 300 MG CAPSULE: 300 | 30 days supply | Qty: 120 | Fill #0

## 2019-09-22 ENCOUNTER — Encounter (HOSPITAL_COMMUNITY)
Admission: RE | Admit: 2019-09-22 | Discharge: 2019-09-22 | Disposition: A | Discharge status: Home / Self Care | Payer: Medicaid Other | Source: Ambulatory Visit | Attending: Internal Medicine | Admitting: Internal Medicine

## 2019-09-22 ENCOUNTER — Other Ambulatory Visit (HOSPITAL_COMMUNITY)
Admission: RE | Admit: 2019-09-22 | Discharge: 2019-09-22 | Disposition: A | Discharge status: Home / Self Care | Payer: Medicaid Other | Source: Ambulatory Visit | Attending: Internal Medicine | Admitting: Internal Medicine

## 2019-09-22 ENCOUNTER — Telehealth: Payer: Self-pay | Admitting: Internal Medicine

## 2019-09-22 ENCOUNTER — Encounter (HOSPITAL_COMMUNITY): Payer: Self-pay

## 2019-09-22 ENCOUNTER — Other Ambulatory Visit: Payer: Self-pay

## 2019-09-22 NOTE — Telephone Encounter (Signed)
Called pt, VM full unable to accept any messages

## 2019-09-22 NOTE — Telephone Encounter (Signed)
Called pt, received VM and unable to leave a message

## 2019-09-22 NOTE — Telephone Encounter (Signed)
[phone] PATIENT CALLED TO RESCHEDULE HER PROCEDURE, SHE IS SICK.  I HAVE HER THE NUMBER TO CALL TO CANCEL HER PRE OP

## 2019-09-22 NOTE — Telephone Encounter (Signed)
Patient called back. She has reschedule procedure to 3/25 at 8:00am. Aware will mail new prep instructions with new pre-op/covid. Confirmed address. Called endo and LMOVM making aware

## 2019-09-22 NOTE — Telephone Encounter (Signed)
Spoke with endo and patient will be placed in depot into we speak with patient

## 2019-09-25 ENCOUNTER — Encounter: Payer: Self-pay | Admitting: *Deleted

## 2019-09-25 DIAGNOSIS — M8949 Other hypertrophic osteoarthropathy, multiple sites: Secondary | ICD-10-CM | POA: Diagnosis not present

## 2019-09-25 DIAGNOSIS — D509 Iron deficiency anemia, unspecified: Secondary | ICD-10-CM | POA: Diagnosis not present

## 2019-09-25 DIAGNOSIS — Z6841 Body Mass Index (BMI) 40.0 and over, adult: Secondary | ICD-10-CM | POA: Diagnosis not present

## 2019-09-25 DIAGNOSIS — E782 Mixed hyperlipidemia: Secondary | ICD-10-CM | POA: Diagnosis not present

## 2019-09-25 DIAGNOSIS — E039 Hypothyroidism, unspecified: Secondary | ICD-10-CM | POA: Diagnosis not present

## 2019-09-25 DIAGNOSIS — E559 Vitamin D deficiency, unspecified: Secondary | ICD-10-CM | POA: Diagnosis not present

## 2019-09-25 DIAGNOSIS — I10 Essential (primary) hypertension: Secondary | ICD-10-CM | POA: Diagnosis not present

## 2019-09-25 DIAGNOSIS — F329 Major depressive disorder, single episode, unspecified: Secondary | ICD-10-CM | POA: Diagnosis not present

## 2019-09-25 DIAGNOSIS — E1100 Type 2 diabetes mellitus with hyperosmolarity without nonketotic hyperglycemic-hyperosmolar coma (NKHHC): Secondary | ICD-10-CM | POA: Diagnosis not present

## 2019-09-25 DIAGNOSIS — E538 Deficiency of other specified B group vitamins: Secondary | ICD-10-CM | POA: Diagnosis not present

## 2019-09-25 DIAGNOSIS — E162 Hypoglycemia, unspecified: Secondary | ICD-10-CM | POA: Diagnosis not present

## 2019-10-03 ENCOUNTER — Other Ambulatory Visit: Payer: Self-pay | Admitting: Cardiovascular Disease

## 2019-10-03 ENCOUNTER — Other Ambulatory Visit: Payer: Self-pay | Admitting: Internal Medicine

## 2019-10-03 ENCOUNTER — Other Ambulatory Visit: Payer: Self-pay | Admitting: Nurse Practitioner

## 2019-10-03 MED FILL — hydrALAZINE HCL 50 MG TABS: 50 | 30 days supply | Qty: 90 | Fill #0

## 2019-10-03 MED FILL — LEVOTHYROXINE 88 MCG TABLET: 88 | 30 days supply | Qty: 30 | Fill #0

## 2019-10-03 MED FILL — AMITRIPTYLINE HCL 50 MG TAB: 50 | 30 days supply | Qty: 30 | Fill #1

## 2019-10-03 MED FILL — SPIRONOLACTONE 25 MG TABLET: 25 | 30 days supply | Qty: 30 | Fill #2

## 2019-10-05 DIAGNOSIS — M545 Low back pain: Secondary | ICD-10-CM | POA: Diagnosis not present

## 2019-10-05 DIAGNOSIS — M25551 Pain in right hip: Secondary | ICD-10-CM | POA: Diagnosis not present

## 2019-10-05 DIAGNOSIS — M79641 Pain in right hand: Secondary | ICD-10-CM | POA: Diagnosis not present

## 2019-10-05 DIAGNOSIS — M25552 Pain in left hip: Secondary | ICD-10-CM | POA: Diagnosis not present

## 2019-10-07 ENCOUNTER — Encounter: Payer: Self-pay | Admitting: Internal Medicine

## 2019-10-07 NOTE — Progress Notes (Signed)
I received a note from [facility] Dr. Christella Noa.  Patient was seen by him 08/21/2019 for evaluation of cervical radiculopathy.  His assessment was that her pain was likely coming from the shoulders and not from C5 radiculopathy.  Patient had told him that she had been told in the past she had a small rotator cuff tear on the left side.  However he noted that abduction on the right side was much worse than the left.  He recommended that she be evaluated for pathology in her shoulders.  He also noted that on CAT scan done on 11/2018 she had severe osteoarthritis of both hips.

## 2019-10-09 ENCOUNTER — Other Ambulatory Visit: Payer: Self-pay | Admitting: Internal Medicine

## 2019-10-09 MED FILL — ATORVASTATIN CALCIUM 20 MG: 20 | 30 days supply | Qty: 15 | Fill #1

## 2019-10-09 MED FILL — DULoxetine HCL 30 MG CPEP: 30 | 30 days supply | Qty: 30 | Fill #6

## 2019-10-09 MED FILL — MONTELUKAST SOD 10 MG TAB: 10 | 30 days supply | Qty: 30 | Fill #2

## 2019-10-09 MED FILL — METOPROLOL TARTRATE 75 MG T: 75 | 30 days supply | Qty: 60 | Fill #2

## 2019-10-10 ENCOUNTER — Other Ambulatory Visit: Payer: Self-pay

## 2019-10-10 ENCOUNTER — Ambulatory Visit: Payer: Medicaid Other | Attending: Internal Medicine | Admitting: Physical Therapy

## 2019-10-10 DIAGNOSIS — R2681 Unsteadiness on feet: Secondary | ICD-10-CM | POA: Diagnosis not present

## 2019-10-10 DIAGNOSIS — R262 Difficulty in walking, not elsewhere classified: Secondary | ICD-10-CM | POA: Diagnosis not present

## 2019-10-10 DIAGNOSIS — M6281 Muscle weakness (generalized): Secondary | ICD-10-CM | POA: Diagnosis not present

## 2019-10-10 MED FILL — BUPROPION HCL ER (XL) 300 M: 300 | 30 days supply | Qty: 30 | Fill #0

## 2019-10-10 NOTE — Therapy (Signed)
[facility] [address] [place], [place], [address] Phone: [phone]   Fax:  [phone]  Physical Therapy Evaluation  Patient Details  Name: Robin [name] MRN: [id number] Date of Birth: [birth year] Referring Provider (PT): Karle Plumber, MD   Encounter Date: 10/10/2019  PT End of Session - 10/10/19 1501    Visit Number  1    Number of Visits  12    Date for PT Re-Evaluation  01/08/20    Authorization Type  Medicaid    PT Start Time  1106    PT Stop Time  1150    PT Time Calculation (min)  44 min    Equipment Utilized During Treatment  Gait belt;Other (comment)   Utlized larger size Blue gait belt; bariatric RW   Activity Tolerance  Patient limited by pain    Behavior During Therapy  [facility] for tasks assessed/performed       Past Medical History:  Diagnosis Date  . Anxiety disorder   . Arthritis   . Asthma   . Chronic neck pain   . Constipation   . Depression   . Diabetes mellitus without complication ([place])   . Dyspnea   . GERD (gastroesophageal reflux disease)   . Hiatal hernia    small  . Hyperlipidemia   . Hypertension   . Hypothyroidism   . Schatzki's ring    non critical  . Sleep apnea    CPAP, Sleep study at [facility]  . Sleep apnea     Past Surgical History:  Procedure Laterality Date  . ABDOMINAL HYSTERECTOMY    . ABDOMINAL HYSTERECTOMY  02/18/2000  . CARDIAC CATHETERIZATION  08/18/2003   normal L main/LAD/L Cfx/RCA (Dr. Adora Fridge)  . COLONOSCOPY  2006   Dr. [name], hyperplastic polyps  . COLONOSCOPY  08/06/11   abnormal terminal ileum for 10cm, erosions, geographical ulceration. Bx small bowel mucosa with prominent intramucosal lymphoid aggregates, slightly inflammed  . DIRECT LARYNGOSCOPY  03/15/2012   Procedure: DIRECT LARYNGOSCOPY;  Surgeon: [name], MD;  Location: [place];  Service: ENT;  Laterality: N/A; Dr. Wolicki--:>no foreign body seen. normal esophagus  to 40cm  . ESOPHAGEAL DILATION  04/17/2015   Procedure: ESOPHAGEAL DILATION;  Surgeon: [name], MD;  [facility];  Service: Endoscopy;;  . ESOPHAGOGASTRODUODENOSCOPY  08/23/2008   [id number] distal esophageal erosions consistent with mild erosive reflux esophagitis, otherwise unremarkable esophagus/ Tiny antral erosions of doubtful clinical significance, otherwise normal stomach, patent pylorus, normal D1 and D2  . ESOPHAGOGASTRODUODENOSCOPY  08/06/11   small hh, noncritical Schatzki's ring s/p 9 F  . ESOPHAGOGASTRODUODENOSCOPY N/A 04/17/2015   [id number] s/p dilation  . ESOPHAGOGASTRODUODENOSCOPY (EGD) WITH PROPOFOL N/A 01/20/2018   Dr. [name]: Erosive gastropathy, normal-appearing esophagus status post empiric dilation.  Chronic gastritis, no H. pylori.  . ESOPHAGOSCOPY  03/15/2012   Procedure: ESOPHAGOSCOPY;  Surgeon: [name], MD;  Location: [facility];  Service: ENT;  Laterality: N/A;  . KNEE ARTHROSCOPY  04/27/2001  . MALONEY DILATION N/A 01/20/2018   Procedure: Venia Minks DILATION;  Surgeon: [name], MD;  [facility];  Service: Endoscopy;  Laterality: N/A;  . [place]  2003   negative bruce protocol exercise tress test; EF 68%; intermediate risk study due to evidence of anterior wall ischemia extending from mid-ventricle to apex  . PITUITARY SURGERY  06/2012   benign tumor, surgeon at [facility]  . RECTOCELE REPAIR    .  TRANSTHORACIC ECHOCARDIOGRAM  2003   EF normal  . VAGINAL PROLAPSE REPAIR    . VIDEO BRONCHOSCOPY Bilateral 03/08/2017   Procedure: VIDEO BRONCHOSCOPY WITHOUT FLUORO;  Surgeon: Javier Glazier, MD;  Location: [facility];  Service: Cardiopulmonary;  Laterality: Bilateral;    There were no vitals filed for this visit.   Subjective Assessment - 10/10/19 1111    Subjective  "My balance is off."  My  legs are not that strong.  Arthritis in both hips, knees.  I do a little walking at home; mostly use the wheelchair for  community.  Have a cane and walker.  No falls in the past 6 months.  Have had falls in the past-knees will buckle    Patient is accompained by:  Family member   Husband   Pertinent History  PMH: HTN, pre-DM, OSA, fibromyalgia, depression, obesity,  GERD, hypothyroid, OA of multiple js (hands, knees, shoulders)MRI pituitary adenoma: stable; pinched nerve in low back    Limitations  Walking;Standing    How long can you stand comfortably?  5 min    How long can you walk comfortably?  Walks if she needs to; always having pain    Patient Stated Goals  Pt's goal for therapy is to try to get around better than I am now.    Currently in Pain?  Yes    Pain Score  10-Worst pain ever    Pain Location  Knee    Pain Orientation  Right;Left    Pain Descriptors / Indicators  Aching    Pain Type  Chronic pain    Pain Onset  More than a month ago    Pain Frequency  Constant    Aggravating Factors   weather    Pain Relieving Factors  Medication takes edge off    Multiple Pain Sites  Yes    Pain Score  9    Pain Location  Hip    Pain Orientation  Right;Left    Pain Descriptors / Indicators  Sharp    Pain Type  Chronic pain    Pain Onset  More than a month ago    Pain Frequency  Constant    Aggravating Factors   weather    Pain Relieving Factors  always hurting    Pain Score  9    Pain Location  Back    Pain Orientation  Mid;Lower;Left;Right    Pain Descriptors / Indicators  Radiating;Sharp    Pain Type  Chronic pain    Pain Onset  More than a month ago    Pain Frequency  Constant    Aggravating Factors   sitting positions    Pain Relieving Factors  unsure, nothing really alleviates         [facility] PT Assessment - 10/10/19 1123      Assessment   Medical Diagnosis  Balance, gait    Referring Provider (PT)  Karle Plumber, MD    Onset Date/Surgical Date  07/28/19   MD order     Precautions   Precautions  Fall    Precaution Comments  Significant pain in multiple joints limiting standing  and gait      Balance Screen   Has the patient fallen in the past 6 months  No    Has the patient had a decrease in activity level because of a fear of falling?   Yes    Is the patient reluctant to leave their home because of a fear of falling?  Yes      [place]  Private residence    Living Arrangements  Spouse/significant other    Available Help at Discharge  Family    Type of [place] to enter   Uses with banister at top of steps and cane   Entrance Stairs-Number of Steps  3-4    Entrance Stairs-Rails  None    Home Layout  One level    [place] - 2 wheels;Cane - single point;Wheelchair - manual      Prior Function   Level of Independence  Independent with basic ADLs   Sitting for most   Leisure  Love to walk (been about 2 years since able to walk more freely); used to perform exercise videos, seated      Observation/Other Assessments   Focus on Therapeutic Outcomes (FOTO)   NA      ROM / Strength   AROM / PROM / Strength  Strength;PROM      PROM   Overall PROM   Deficits    Overall PROM Comments  Seated L knee extension lacks 30 degrees from full extension; R knee lacks 24 degrees full knee extension; L knee flexion 90 degrees, R knee flexion 96 degrees; both measured in sitting (began AROM, PT provides end range overpressure for additional ROM)      Strength   Overall Strength  Deficits    Overall Strength Comments  Limitations due to pain    Strength Assessment Site  Hip;Knee;Ankle    Right/Left Hip  Right;Left    Right Hip Flexion  3/5    Left Hip Flexion  3/5    Right/Left Knee  Right;Left    Right Knee Extension  3-/5    Left Knee Extension  3-/5    Right/Left Ankle  Right;Left    Right Ankle Dorsiflexion  3/5    Left Ankle Dorsiflexion  3+/5      Transfers   Transfers  Sit to Stand;Stand to Sit    Sit to Stand  3: Mod assist;With upper extremity assist;With armrests;From chair/3-in-1     Stand to Sit  4: Min assist;With upper extremity assist;With armrests;To chair/3-in-1      Ambulation/Gait   Ambulation/Gait  Yes    Ambulation/Gait Assistance  4: Min assist;3: Mod assist    Ambulation/Gait Assistance Details  Pt with significant forward lean on RW, antalgic gait pattern; limited in distance due to pain.    Ambulation Distance (Feet)  4 Feet    Assistive device  Rolling walker   Bariatric RW (used Blue gait belt)   Gait Pattern  Step-through pattern;Decreased step length - right;Decreased step length - left;Decreased hip/knee flexion - right;Decreased hip/knee flexion - left;Decreased dorsiflexion - right;Decreased dorsiflexion - left;Right flexed knee in stance;Left flexed knee in stance;Trunk flexed;Poor foot clearance - left;Poor foot clearance - right;Antalgic    Ambulation Surface  Level;Indoor                Objective measurements completed on examination: See above findings.              PT Education - 10/10/19 1459    Education Details  Discussed goals and POC, importance of gentle exercises for improved joint mobility and strength to start    Person(s) Educated  Patient;Spouse    Methods  Explanation;Demonstration    Comprehension  Verbalized understanding;Returned demonstration       PT Short  Term Goals - 10/10/19 1508      PT SHORT TERM GOAL #1   Title  Pt will be independent with HEP to address strength, flexibility, balance, and gait progression.  TARGET 11/03/2019    Baseline  No current HEP    Time  3    Period  Weeks    Status  New    Target Date  11/03/19      PT SHORT TERM GOAL #2   Title  Pt will be ambulate at least 15 ft using RW (vs cane), min assist for improved gait within home.    Baseline  4 ft min/mod assist at eval with RW    Time  3    Period  Weeks    Status  New    Target Date  11/03/19      PT SHORT TERM GOAL #3   Title  Pt will perform sit<>stand with minimal assistance for improved transfer efficiency  and safety.    Baseline  mod assist    Time  3    Period  Weeks    Status  New    Target Date  11/03/19        PT Long Term Goals - 10/10/19 1510      PT LONG TERM GOAL #1   Title  Pt will be independent with progression of HEP for improved strength, flexibility, balance, and gait.  TARGET 12/04/2019    Baseline  No current HEP    Time  7    Period  Weeks    Status  New    Target Date  12/04/19      PT LONG TERM GOAL #2   Title  Pt will ambulate at least 40 ft using RW with min guard assistance, for improved gait efficiency and safety in the home.    Baseline  4 ft min/mod assist RW at eval    Time  7    Period  Weeks    Status  New    Target Date  12/04/19      PT LONG TERM GOAL #3   Title  Pt will improve sit<>stand to supervision level, for improved transfer efficiency and safety.    Baseline  mod assist sit<>stand at eval    Time  7    Period  Weeks    Status  New    Target Date  12/04/19      PT LONG TERM GOAL #4   Title  Pt will perform at least 10 minutes of standing activities with UE support, for improved participation in ADLs and household tasks.    Baseline  Pt reports standing no more than 5 minutes at a time, limited due to pain    Time  7    Period  Weeks    Status  New    Target Date  12/04/19      PT LONG TERM GOAL #5   Title  Pt will negotiate at least 3 steps with cane and HHA for improved negotiation of stairs into and out of home.    Baseline  Uses cane, banister and assist of family member at home    Time  7    Period  Weeks    Status  New    Target Date  12/04/19             Plan - 10/10/19 1502    Clinical Impression Statement  Pt is a 64 year old female who  presents to OPPT for balance and gait, with history of significant joint pain from arthritis limiting gait.  She presents with decreased lower extremity flexibility, decreased lower extremity strength, decreased independence with transfers, decreased gait safety and endurance; she  requires min/mod assist for trasnfers and gait.  She has had history of falls, but not in the past 6 months per pt report.  She would benefit from skilled PT to address the above stated deficits to help patient achieve increased functional mobility, independence and decreased fall risk.    Personal Factors and Comorbidities  Comorbidity 3+    Comorbidities  HTN, pre-DM, OSA, fibromyalgia, depression, obesity,  GERD, hypothyroid, OA of multiple js (hands, knees, shoulders)MRI pituitary adenoma: stable    Examination-Activity Limitations  Stand;Stairs;Locomotion Level;Transfers    Examination-Participation Restrictions  Community Activity;Meal Prep;Other   Standing ADLs   Stability/Clinical Decision Making  Evolving/Moderate complexity    Clinical Decision Making  Moderate    Rehab Potential  Good    PT Frequency  Other (comment)   1x/wk for 3 weeks; then 2x/wk for 4 weeks   PT Duration  Other (comment)   7 weeks total POC   PT Treatment/Interventions  ADLs/Self Care Home Management;Aquatic Therapy;Gait training;Stair training;Functional mobility training;Therapeutic activities;Therapeutic exercise;Balance training;Neuromuscular re-education;Patient/family education    PT Next Visit Plan  Initiate HEP for lower extremity flexibility and strength; gait and stair training (currently pt is using banister and cane for 3-4 steps at home); standing balance, standing tolerance    Consulted and Agree with Plan of Care  Patient;Family member/caregiver    Family Member Consulted  Husband       Patient will benefit from skilled therapeutic intervention in order to improve the following deficits and impairments:  Abnormal gait, Decreased range of motion, Difficulty walking, Decreased endurance, Decreased activity tolerance, Pain, Decreased balance, Impaired flexibility, Decreased mobility, Decreased strength  Visit Diagnosis: Difficulty in walking, not elsewhere classified  Muscle weakness  (generalized)  Unsteadiness on feet     Problem List Patient Active Problem List   Diagnosis Date Noted  . Multilevel degenerative disc disease 10/31/2018  . Abdominal pain 10/28/2018  . Change in bowel function 10/28/2018  . Gastritis and gastroduodenitis 04/26/2018  . Atrophic vaginitis 04/01/2018  . Diabetes mellitus ([place]) 03/31/2018  . Upper airway cough syndrome 02/08/2018  . Dysphagia 12/14/2017  . 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 Urinary incontinence 07/23/2017  . Early satiety 06/16/2017  . Hx of iron deficiency anemia 05/28/2017  . Throat pain in adult 04/05/2017  . Prediabetes 02/04/2017  . Dyspnea on exertion 12/23/2016  . Chronic allergic rhinitis 12/23/2016  . Hemoptysis 12/23/2016  . Fibromyalgia 08/18/2016  . Chronic lymphocytic thyroiditis 08/18/2016  . Depression 04/01/2016  . OSA (obstructive sleep apnea) 04/01/2016  . Essential hypertension 12/09/2015  . Hypothyroidism 12/09/2015  . Morbid obesity due to excess calories ([place]) 12/09/2015  . Constipation 05/27/2015  . Fatty liver 05/27/2015  . Abdominal pain, chronic, epigastric 07/04/2012  . Anxiety 06/09/2012  . History of gastroesophageal reflux (GERD) 06/09/2012  . Hyperlipidemia 06/09/2012  . Refusal of blood transfusions as patient is Jehovah's Witness 06/09/2012  . 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 Abnormal small bowel biopsy 09/18/2011  . Lactose intolerance 09/18/2011  . GERD 01/21/2010    Gale Klar W. 10/10/2019, 5:28 PM Frazier Butt., PT  [facility] [address] [place], [place], [address] Phone: [phone]   Fax:  [phone]  Name: Robin [name] MRN: [id number] Date of Birth: [birth year]

## 2019-10-16 ENCOUNTER — Ambulatory Visit (INDEPENDENT_AMBULATORY_CARE_PROVIDER_SITE_OTHER): Payer: Medicaid Other | Admitting: Podiatry

## 2019-10-16 ENCOUNTER — Encounter: Payer: Self-pay | Admitting: Podiatry

## 2019-10-16 ENCOUNTER — Other Ambulatory Visit: Payer: Self-pay

## 2019-10-16 DIAGNOSIS — B351 Tinea unguium: Secondary | ICD-10-CM

## 2019-10-16 DIAGNOSIS — E119 Type 2 diabetes mellitus without complications: Secondary | ICD-10-CM

## 2019-10-16 DIAGNOSIS — M79674 Pain in right toe(s): Secondary | ICD-10-CM | POA: Diagnosis not present

## 2019-10-16 DIAGNOSIS — M79675 Pain in left toe(s): Secondary | ICD-10-CM

## 2019-10-16 MED FILL — LINZESS 290 MCG CAPSULE: 290 | 30 days supply | Qty: 30 | Fill #0

## 2019-10-16 MED FILL — metFORMIN HCL 500 MG TABS: 500 | 30 days supply | Qty: 15 | Fill #1

## 2019-10-16 MED FILL — DEXILANT DR 60 MG CAPSULE: 60 | 30 days supply | Qty: 30 | Fill #4

## 2019-10-16 NOTE — Progress Notes (Signed)
Subjective: Robin [name] presents today for preventative diabetic foot. Patient is seen for follow up of painful, mycotic toenails which interfere with comfortable ambulation when wearing enclosed shoe gear. Pain is relieved with periodic professional debridement.  Ladell Pier, MD is patient's PCP. Last visit was: 07/28/2019.  Medications reviewed in chart.  Allergies  Allergen Reactions  . Celexa [Citalopram Hydrobromide] Other (See Comments)    "Made me feel out of it"  . Gabapentin     Contributed to lower extremity edema  . Norvasc [Amlodipine Besylate]     Edema in lower extremities  . Diflucan [Fluconazole] Rash   Objective: There were no vitals filed for this visit.  Vascular Examination: Capillary refill time to digits immediate b/l.  Dorsalis pedis and posterior tibial pulses present b/l.  Digital hair absent b/l.  Skin temperature gradient WNL b/l.   Dermatological Examination: Skin with normal turgor, texture and tone b/l.  Toenails 1-5 b/l discolored, thick, dystrophic with subungual debris and pain with palpation to nailbeds due to thickness of nails.  Musculoskeletal: Muscle strength 5/5 b/l to all LE muscle groups.  Gross bony deformities:  Pes planus b/l.  No pain, crepitus or joint limitation with passive/active ROM b/l.  Neurological Examination: Protective sensation intact 5/5 b/l with 10 gram monofilament.  Vibratory sensation intact bilaterally.   Hemoglobin A1C Latest Ref Rng & Units 04/10/2019  HGBA1C 4.8 - 5.6 % 5.9(H)  Some recent data might be hidden    Assessment: 1. Painful onychomycosis toenails 1-5 b/l 2. NIDDM  Plan: 1. Continue diabetic foot care principles. Literature dispensed on today. 2. Toenails 1-5 b/l were debrided in length and girth without iatrogenic bleeding. 3. Patient to continue soft, supportive shoe gear. 4. Patient to report any pedal injuries to medical professional. 5. Follow up 3 months.   6. Patient/POA to call should there be a concern in the interim.

## 2019-10-16 NOTE — Patient Instructions (Signed)
Diabetes Mellitus and Foot Care Foot care is an important part of your health, especially when you have diabetes. Diabetes may cause you to have problems because of poor blood flow (circulation) to your feet and legs, which can cause your skin to:  Become thinner and drier.  Break more easily.  Heal more slowly.  Peel and crack. You may also have nerve damage (neuropathy) in your legs and feet, causing decreased feeling in them. This means that you may not notice minor injuries to your feet that could lead to more serious problems. Noticing and addressing any potential problems early is the best way to prevent future foot problems. How to care for your feet Foot hygiene  Wash your feet daily with warm water and mild soap. Do not use hot water. Then, pat your feet and the areas between your toes until they are completely dry. Do not soak your feet as this can dry your skin.  Trim your toenails straight across. Do not dig under them or around the cuticle. File the edges of your nails with an emery board or nail file.  Apply a moisturizing lotion or petroleum jelly to the skin on your feet and to dry, brittle toenails. Use lotion that does not contain alcohol and is unscented. Do not apply lotion between your toes. Shoes and socks  Wear clean socks or stockings every day. Make sure they are not too tight. Do not wear knee-high stockings since they may decrease blood flow to your legs.  Wear shoes that fit properly and have enough cushioning. Always look in your shoes before you put them on to be sure there are no objects inside.  To break in new shoes, wear them for just a few hours a day. This prevents injuries on your feet. Wounds, scrapes, corns, and calluses  Check your feet daily for blisters, cuts, bruises, sores, and redness. If you cannot see the bottom of your feet, use a mirror or ask someone for help.  Do not cut corns or calluses or try to remove them with medicine.  If you  find a minor scrape, cut, or break in the skin on your feet, keep it and the skin around it clean and dry. You may clean these areas with mild soap and water. Do not clean the area with peroxide, alcohol, or iodine.  If you have a wound, scrape, corn, or callus on your foot, look at it several times a day to make sure it is healing and not infected. Check for: ? Redness, swelling, or pain. ? Fluid or blood. ? Warmth. ? Pus or a bad smell. General instructions  Do not cross your legs. This may decrease blood flow to your feet.  Do not use heating pads or hot water bottles on your feet. They may burn your skin. If you have lost feeling in your feet or legs, you may not know this is happening until it is too late.  Protect your feet from hot and cold by wearing shoes, such as at the beach or on hot pavement.  Schedule a complete foot exam at least once a year (annually) or more often if you have foot problems. If you have foot problems, report any cuts, sores, or bruises to your health care provider immediately. Contact a health care provider if:  You have a medical condition that increases your risk of infection and you have any cuts, sores, or bruises on your feet.  You have an injury that is not   healing.  You have redness on your legs or feet.  You feel burning or tingling in your legs or feet.  You have pain or cramps in your legs and feet.  Your legs or feet are numb.  Your feet always feel cold.  You have pain around a toenail. Get help right away if:  You have a wound, scrape, corn, or callus on your foot and: ? You have pain, swelling, or redness that gets worse. ? You have fluid or blood coming from the wound, scrape, corn, or callus. ? Your wound, scrape, corn, or callus feels warm to the touch. ? You have pus or a bad smell coming from the wound, scrape, corn, or callus. ? You have a fever. ? You have a red line going up your leg. Summary  Check your feet every day  for cuts, sores, red spots, swelling, and blisters.  Moisturize feet and legs daily.  Wear shoes that fit properly and have enough cushioning.  If you have foot problems, report any cuts, sores, or bruises to your health care provider immediately.  Schedule a complete foot exam at least once a year (annually) or more often if you have foot problems. This information is not intended to replace advice given to you by your health care provider. Make sure you discuss any questions you have with your health care provider. Document Revised: 06/14/2019 Document Reviewed: 10/23/2016 Elsevier Patient Education  2020 [employer] Inc[employer]  

## 2019-10-17 ENCOUNTER — Telehealth: Payer: Self-pay | Admitting: Internal Medicine

## 2019-10-17 MED ORDER — NYSTATIN 100000 UNIT/GM EX POWD: 1.0000 "application " | Freq: Three times a day (TID) | CUTANEOUS | 0 refills | Status: DC

## 2019-10-17 MED FILL — NYSTATIN 100000 UNIT/GM POW: 100000 | 5 days supply | Qty: 15 | Fill #0

## 2019-10-17 NOTE — Telephone Encounter (Signed)
Patient called stating skin is raw under her breast.

## 2019-10-17 NOTE — Telephone Encounter (Signed)
Will forward to pcp

## 2019-10-18 ENCOUNTER — Ambulatory Visit: Payer: Medicaid Other | Admitting: Physical Therapy

## 2019-10-19 MED FILL — GABAPENTIN 300 MG CAPSULE: 300 | 30 days supply | Qty: 120 | Fill #1

## 2019-10-23 DIAGNOSIS — E039 Hypothyroidism, unspecified: Secondary | ICD-10-CM | POA: Diagnosis not present

## 2019-10-23 DIAGNOSIS — R600 Localized edema: Secondary | ICD-10-CM | POA: Diagnosis not present

## 2019-10-23 DIAGNOSIS — F329 Major depressive disorder, single episode, unspecified: Secondary | ICD-10-CM | POA: Diagnosis not present

## 2019-10-23 DIAGNOSIS — E538 Deficiency of other specified B group vitamins: Secondary | ICD-10-CM | POA: Diagnosis not present

## 2019-10-23 DIAGNOSIS — Z6841 Body Mass Index (BMI) 40.0 and over, adult: Secondary | ICD-10-CM | POA: Diagnosis not present

## 2019-10-23 DIAGNOSIS — R5383 Other fatigue: Secondary | ICD-10-CM | POA: Diagnosis not present

## 2019-10-23 DIAGNOSIS — I152 Hypertension secondary to endocrine disorders: Secondary | ICD-10-CM | POA: Diagnosis not present

## 2019-10-23 DIAGNOSIS — E1100 Type 2 diabetes mellitus with hyperosmolarity without nonketotic hyperglycemic-hyperosmolar coma (NKHHC): Secondary | ICD-10-CM | POA: Diagnosis not present

## 2019-10-23 DIAGNOSIS — E782 Mixed hyperlipidemia: Secondary | ICD-10-CM | POA: Diagnosis not present

## 2019-10-23 DIAGNOSIS — E162 Hypoglycemia, unspecified: Secondary | ICD-10-CM | POA: Diagnosis not present

## 2019-10-23 DIAGNOSIS — M8949 Other hypertrophic osteoarthropathy, multiple sites: Secondary | ICD-10-CM | POA: Diagnosis not present

## 2019-10-24 ENCOUNTER — Other Ambulatory Visit: Payer: Self-pay | Admitting: Cardiovascular Disease

## 2019-10-24 MED FILL — CELECOXIB 100 MG CAP: 100 | 30 days supply | Qty: 60 | Fill #5

## 2019-10-24 MED FILL — ACCU-CHEK AVIVA PLUS TEST S: 30 days supply | Qty: 100 | Fill #3

## 2019-10-25 DIAGNOSIS — G4733 Obstructive sleep apnea (adult) (pediatric): Secondary | ICD-10-CM | POA: Diagnosis not present

## 2019-10-25 DIAGNOSIS — E782 Mixed hyperlipidemia: Secondary | ICD-10-CM | POA: Diagnosis not present

## 2019-10-25 DIAGNOSIS — I10 Essential (primary) hypertension: Secondary | ICD-10-CM | POA: Diagnosis not present

## 2019-10-25 DIAGNOSIS — R0600 Dyspnea, unspecified: Secondary | ICD-10-CM | POA: Diagnosis not present

## 2019-10-25 LAB — LIPID PANEL
Chol/HDL Ratio: 2.4 ratio (ref 0.0–4.4)
Cholesterol, Total: 141 mg/dL (ref 100–199)
HDL: 58 mg/dL (ref >39)
LDL Chol Calc (NIH): 71 mg/dL (ref 0–99)
Triglycerides: 58 mg/dL (ref 0–149)
VLDL Cholesterol Cal: 12 mg/dL (ref 5–40)

## 2019-10-25 LAB — HEPATIC FUNCTION PANEL
ALT: 25 IU/L (ref 0–32)
AST: 21 IU/L (ref 0–40)
Albumin: 4.1 g/dL (ref 3.8–4.8)
Alkaline Phosphatase: 88 IU/L (ref 39–117)
Bilirubin Total: 0.4 mg/dL (ref 0.0–1.2)
Bilirubin, Direct: 0.13 mg/dL (ref 0.00–0.40)
Total Protein: 7.2 g/dL (ref 6.0–8.5)

## 2019-10-25 MED FILL — LEVOTHYROXINE 88 MCG TABLET: 88 | 30 days supply | Qty: 30 | Fill #1

## 2019-10-25 MED FILL — AMITRIPTYLINE HCL 50 MG TAB: 50 | 30 days supply | Qty: 30 | Fill #2

## 2019-10-25 MED FILL — hydrALAZINE HCL 50 MG TABS: 50 | 30 days supply | Qty: 90 | Fill #1

## 2019-10-26 ENCOUNTER — Encounter: Payer: Self-pay | Admitting: *Deleted

## 2019-10-27 ENCOUNTER — Ambulatory Visit: Payer: Medicaid Other | Admitting: Physical Therapy

## 2019-10-27 ENCOUNTER — Other Ambulatory Visit: Payer: Self-pay | Admitting: Cardiovascular Disease

## 2019-10-30 DIAGNOSIS — E162 Hypoglycemia, unspecified: Secondary | ICD-10-CM | POA: Diagnosis not present

## 2019-10-30 DIAGNOSIS — R5383 Other fatigue: Secondary | ICD-10-CM | POA: Diagnosis not present

## 2019-10-30 DIAGNOSIS — E538 Deficiency of other specified B group vitamins: Secondary | ICD-10-CM | POA: Diagnosis not present

## 2019-10-30 DIAGNOSIS — F331 Major depressive disorder, recurrent, moderate: Secondary | ICD-10-CM | POA: Diagnosis not present

## 2019-10-30 DIAGNOSIS — E782 Mixed hyperlipidemia: Secondary | ICD-10-CM | POA: Diagnosis not present

## 2019-10-30 DIAGNOSIS — F54 Psychological and behavioral factors associated with disorders or diseases classified elsewhere: Secondary | ICD-10-CM | POA: Diagnosis not present

## 2019-10-30 DIAGNOSIS — E559 Vitamin D deficiency, unspecified: Secondary | ICD-10-CM | POA: Diagnosis not present

## 2019-10-30 DIAGNOSIS — D509 Iron deficiency anemia, unspecified: Secondary | ICD-10-CM | POA: Diagnosis not present

## 2019-10-31 ENCOUNTER — Other Ambulatory Visit: Payer: Self-pay | Admitting: Cardiovascular Disease

## 2019-10-31 ENCOUNTER — Encounter: Payer: Self-pay | Admitting: Cardiovascular Disease

## 2019-10-31 ENCOUNTER — Ambulatory Visit (INDEPENDENT_AMBULATORY_CARE_PROVIDER_SITE_OTHER): Payer: Medicaid Other | Admitting: Cardiovascular Disease

## 2019-10-31 ENCOUNTER — Other Ambulatory Visit: Payer: Self-pay

## 2019-10-31 DIAGNOSIS — G4733 Obstructive sleep apnea (adult) (pediatric): Secondary | ICD-10-CM

## 2019-10-31 DIAGNOSIS — I10 Essential (primary) hypertension: Secondary | ICD-10-CM | POA: Diagnosis not present

## 2019-10-31 DIAGNOSIS — R0600 Dyspnea, unspecified: Secondary | ICD-10-CM | POA: Diagnosis not present

## 2019-10-31 DIAGNOSIS — R0609 Other forms of dyspnea: Secondary | ICD-10-CM

## 2019-10-31 DIAGNOSIS — E782 Mixed hyperlipidemia: Secondary | ICD-10-CM | POA: Diagnosis not present

## 2019-10-31 NOTE — Assessment & Plan Note (Signed)
History of dyspnea on exertion with normal LV function most likely related to obesity.

## 2019-10-31 NOTE — Progress Notes (Signed)
10/31/2019 Robin [name]   Jan 28, 1956  [id number]  Primary Physician Ladell Pier, MD Primary Cardiologist: Lorretta Harp MD [name], [place]  HPI:  Robin [name] is a 64 y.o.  morbidly overweight married African-American female mother of 49, grandmother of 55 grandchildren who is accompanied by her husbandKarry.She was referred by her PCP, Dr. Karle Plumber, for increasing dyspnea on exertion of unclear etiology.  I last saw her in the office 08/29/2019.I performed cardiac catheterization on her 08/17/2002 for chest pain, shortness of breath and a false positive Cardiolite suggesting anterior ischemia versus breast attenuation. Her coronary arteries were completely normal as was her LV function. She is noticed increasing dyspnea on exertion over the last several months to years. She has gained a significant amount of weight as well however. She is being evaluated by Dr. Lilli Light pulmonary etiology.  Since I saw her a year ago she has gained several pounds.  She continues to be dyspneic.  She does have obstructive sleep apnea on CPAP however she does not wear this because of out of date settings.   Current Meds  Medication Sig  . ACCU-CHEK AVIVA PLUS test strip USE AS DIRECTED  . acetaminophen (TYLENOL) 500 MG tablet Take 1,000 mg by mouth 2 (two) times daily.   . Acetaminophen-Codeine 300-30 MG tablet acetaminophen 300 mg-codeine 30 mg tablet  . alum hydroxide-mag trisilicate (GAVISCON) [id number] MG CHEW chewable tablet Chew by mouth.  Marland Kitchen amitriptyline (ELAVIL) 50 MG tablet TAKE 1 TABLET BY MOUTH AT BEDTIME.  Marland Kitchen amLODipine (NORVASC) 5 MG tablet Take 1 tablet (5 mg total) by mouth daily. *NEEDS OFFICE VISIT-2ND ATTEMPT*  . aspirin EC 81 MG tablet Take 81 mg by mouth at bedtime.  Marland Kitchen atorvastatin (LIPITOR) 20 MG tablet Take 0.5 tablets (10 mg total) by mouth daily.  Marland Kitchen buPROPion (WELLBUTRIN XL) 300 MG 24 hr tablet TAKE 1 TABLET (300 MG TOTAL) BY MOUTH EVERY MORNING.  .  calcium carbonate (TUMS - DOSED IN MG ELEMENTAL CALCIUM) 500 MG chewable tablet Chew 2 tablets by mouth at bedtime.  . celecoxib (CELEBREX) 100 MG capsule Take 100 mg by mouth 2 (two) times daily.  . cetirizine (ZYRTEC) 10 MG tablet Take 1 tablet (10 mg total) by mouth daily.  . chlorpheniramine (CHLOR-TRIMETON) 4 MG tablet Take 4 mg by mouth every 4 (four) hours as needed for allergies.   Marland Kitchen CINNAMON PO Take 1 capsule by mouth daily.  Marland Kitchen dexamethasone (DECADRON) 0.5 MG tablet dexamethasone 0.5 mg tablet  . DEXILANT 60 MG capsule TAKE 1 CAPSULE (60 MG TOTAL) BY MOUTH DAILY.  Marland Kitchen diclofenac sodium (VOLTAREN) 1 % GEL Apply 2-4 g topically 4 (four) times daily as needed (PAIN.).  Marland Kitchen DULoxetine (CYMBALTA) 30 MG capsule Take 1 capsule (30 mg total) by mouth daily. (Patient taking differently: Take 30 mg by mouth at bedtime. )  . ergocalciferol (VITAMIN D2) 1.25 MG (50000 UT) capsule ergocalciferol (vitamin D2) 1,250 mcg (50,000 unit) capsule  . fexofenadine (ALLEGRA) 180 MG tablet Take 180 mg by mouth daily as needed for allergies or rhinitis.  . fluticasone (FLONASE) 50 MCG/ACT nasal spray Place 1 spray into both nostrils daily.  . furosemide (LASIX) 40 MG tablet TAKE 1/2 TO 1 TABLET BY MOUTH DAILY FOR LOWER EXTREMITY SWELLING  . gabapentin (NEURONTIN) 300 MG capsule TAKE 1 CAPSULE (300 MG TOTAL) BY MOUTH 4 (FOUR) TIMES DAILY.  Marland Kitchen glucose blood (ONETOUCH ULTRA) test strip Use as instructed  . glucose blood test strip  Accu-Chek Aviva Plus test strips  . hydrALAZINE (APRESOLINE) 50 MG tablet Take 1 tablet (50 mg total) by mouth 3 (three) times daily.  Marland Kitchen levothyroxine (SYNTHROID) 88 MCG tablet TAKE 1 TABLET BY MOUTH DAILY.  Marland Kitchen LINZESS 290 MCG CAPS capsule TAKE 1 CAPSULE BY MOUTH DAILY BEFORE BREAKFAST.  . metFORMIN (GLUCOPHAGE) 500 MG tablet TAKE 1/2 TABLET DAILY WITH BREAKFAST  . Metoprolol Tartrate 75 MG TABS TAKE 1 TABLET BY MOUTH TWICE DAILY.  . montelukast (SINGULAIR) 10 MG tablet TAKE 1 TABLET BY MOUTH  AT BEDTIME.  . Multiple Vitamin (MULTIVITAMIN WITH MINERALS) TABS tablet Take 1 tablet by mouth daily. Alive Women's Multivitamin  . nystatin (MYCOSTATIN/NYSTOP) powder Apply 1 application topically 3 (three) times daily.  . Omega-3 Fatty Acids (FISH OIL) 1000 MG CAPS Take 1,000 mg by mouth every evening.  Marland Kitchen OVER THE COUNTER MEDICATION Take 1 capsule by mouth daily. BEET ROOT  . oxyCODONE-acetaminophen (PERCOCET) 10-325 MG tablet Take 1 tablet by mouth 2 (two) times daily.   . polyethylene glycol-electrolytes (TRILYTE) 420 g solution Take 4,000 mLs by mouth as directed.  Marland Kitchen PREMARIN vaginal cream Place 1 Applicatorful vaginally at bedtime.   Marland Kitchen PROVENTIL HFA 108 (90 Base) MCG/ACT inhaler INHALE 2 PUFFS INTO THE LUNGS EVERY 6 HOURS AS NEEDED FOR WHEEZING OR SHORTNESS OF BREATH. (Patient taking differently: Inhale 2 puffs into the lungs every 6 (six) hours as needed for wheezing or shortness of breath. )  . sodium chloride (OCEAN) 0.65 % SOLN nasal spray Place 2 sprays into both nostrils every 4 (four) hours as needed for congestion.  . vitamin B-12 (CYANOCOBALAMIN) 1000 MCG tablet Take 1,000 mcg by mouth daily.     Allergies  Allergen Reactions  . Celexa [Citalopram Hydrobromide] Other (See Comments)    "Made me feel out of it"  . Gabapentin     Contributed to lower extremity edema  . Norvasc [Amlodipine Besylate]     Edema in lower extremities  . Diflucan [Fluconazole] Rash    Social History   Socioeconomic History  . Marital status: Married    Spouse name: Not on file  . Number of children: 4  . Years of education: 10  . Highest education level: Not on file  Occupational History    Employer: UNEMPLOYED  Tobacco Use  . Smoking status: Former Smoker    Packs/day: 0.50    Years: 10.00    Pack years: 5.00    Types: Cigarettes    Start date: 07/14/1975    Quit date: 04/04/1993    Years since quitting: 26.5  . Smokeless tobacco: Never Used  . Tobacco comment: 17 yrs ago  Substance  and Sexual Activity  . Alcohol use: No    Alcohol/week: 0.0 standard drinks  . Drug use: No  . Sexual activity: Not on file  Other Topics Concern  . Not on file  Social History Narrative   Lives with husband in an apartment on the first floor.  Has 4 children.     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  Trying to get disability.  Formerly worked as a Teacher, early years/pre.  Education: 11th grade.      [facility] (12/23/16):   Originally from [facility]. She was raised in [place]. 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 No pets currently.  No bird exposure. She does have mold in her current home in the bathroom, laundry room, & master bedroom.    Social Determinants of Health   Financial Resource Strain:   . Difficulty of Paying Living Expenses: Not on file  Food Insecurity:   . Worried About Charity fundraiser in the Last Year: Not on file  . Ran Out of Food in the Last Year: Not on file  Transportation Needs:   . Lack of Transportation (Medical): Not on file  . Lack of Transportation (Non-Medical): Not on file  Physical Activity:   . Days of Exercise per Week: Not on file  . Minutes of Exercise per Session: Not on file  Stress:   . Feeling of Stress : Not on file  Social Connections:   . Frequency of Communication with Friends and Family: Not on file  . Frequency of Social Gatherings with Friends and Family: Not on file  . Attends Religious Services: Not on file  . Active Member of Clubs or Organizations: Not on file  . Attends Archivist Meetings: Not on file  . Marital Status: Not on file  Intimate Partner Violence:   . Fear of Current or Ex-Partner: Not on file  . Emotionally Abused: Not on file  . Physically Abused: Not on file  . Sexually Abused: Not on file     Review of Systems: General: negative for  chills, fever, night sweats or weight changes.  Cardiovascular: negative for chest pain, dyspnea on exertion, edema, orthopnea, palpitations, paroxysmal nocturnal dyspnea or shortness of breath Dermatological: negative for rash Respiratory: negative for cough or wheezing Urologic: negative for hematuria Abdominal: negative for nausea, vomiting, diarrhea, bright red blood per rectum, melena, or hematemesis Neurologic: negative for visual changes, syncope, or dizziness All other systems reviewed and are otherwise negative except as noted above.    Blood pressure 126/68, pulse 66, temperature (!) 96.8 F (36 C), height 5\' 8"  (1.727 m), weight (!) 349 lb 11.2 oz (158.6 kg), SpO2 99 %.  General appearance: alert and no distress Neck: no adenopathy, no carotid bruit, no JVD, supple, symmetrical, trachea midline and thyroid not enlarged, symmetric, no tenderness/mass/nodules Lungs: clear to auscultation bilaterally Heart: regular rate and rhythm, S1, S2 normal, no murmur, click, rub or gallop Extremities: extremities normal, atraumatic, no cyanosis or edema Pulses: 2+ and symmetric Skin: Skin color, texture, turgor normal. No rashes or lesions Neurologic: Alert and oriented X 3, normal strength and tone. Normal symmetric reflexes. Normal coordination and gait  EKG not performed today  ASSESSMENT AND PLAN:   OSA (obstructive sleep apnea) Ordinarily on CPAP however she is not wearing it now because it does not fit and needs to be updated.  Essential hypertension History of essential hypertension blood pressure measured at 126/68.  She is on amlodipine, hydralazine.  Morbid obesity due to excess calories [facility]) Patient morbid obesity being seen at a bariatric clinic  Dyspnea on exertion History of dyspnea on exertion with normal LV function most likely related to obesity.  Hyperlipidemia Patient hyperlipidemia on statin therapy with recent lipid profile performed 10/25/2019 revealing total  of 141, LDL 114.      Lorretta Harp MD FACP,FACC,FAHA, [facility] 10/31/2019 10:25 AM

## 2019-10-31 NOTE — Assessment & Plan Note (Signed)
Patient morbid obesity being seen at a bariatric clinic

## 2019-10-31 NOTE — Assessment & Plan Note (Signed)
Patient hyperlipidemia on statin therapy with recent lipid profile performed 10/25/2019 revealing total of 141, LDL 114.

## 2019-10-31 NOTE — Assessment & Plan Note (Signed)
History of essential hypertension blood pressure measured at 126/68.  She is on amlodipine, hydralazine.

## 2019-10-31 NOTE — Patient Instructions (Addendum)
Medication Instructions:  Your physician recommends that you continue on your current medications as directed. Please refer to the Current Medication list given to you today.  If you need a refill on your cardiac medications before your next appointment, please call your pharmacy.   Lab work: NONE  Testing/Procedures: NONE  Follow-Up: At [facility], you and your health needs are our priority.  As part of our continuing mission to provide you with exceptional heart care, we have created designated Provider Care Teams.  These Care Teams include your primary Cardiologist (physician) and Advanced Practice Providers (APPs -  Physician Assistants and Nurse Practitioners) who all work together to provide you with the care you need, when you need it. You may see Dr. Gwenlyn Found or one of the following Advanced Practice Providers on your designated Care Team:    Kerin Ransom, PA-C  [place], [place]  Coletta Memos, [place]  Your physician wants you to follow-up in: 1 year with Dr. Gwenlyn Found   Dr. Gwenlyn Found is going to order a sleep study.

## 2019-10-31 NOTE — Assessment & Plan Note (Addendum)
Ordinarily on CPAP however she is not wearing it now because it does not fit and needs to be updated.  I am going to reorder an outpatient sleep study and have her follow-up with Barry Brunner, RN

## 2019-11-01 MED FILL — VIT D2 1.25 MG (50,000 UNIT: 1.25 MG | 84 days supply | Qty: 12 | Fill #0

## 2019-11-02 ENCOUNTER — Other Ambulatory Visit: Payer: Self-pay

## 2019-11-02 ENCOUNTER — Ambulatory Visit: Payer: Medicaid Other | Admitting: Physical Therapy

## 2019-11-02 ENCOUNTER — Encounter: Payer: Self-pay | Admitting: Physical Therapy

## 2019-11-02 DIAGNOSIS — M6281 Muscle weakness (generalized): Secondary | ICD-10-CM | POA: Diagnosis not present

## 2019-11-02 DIAGNOSIS — R262 Difficulty in walking, not elsewhere classified: Secondary | ICD-10-CM | POA: Diagnosis not present

## 2019-11-02 DIAGNOSIS — R2681 Unsteadiness on feet: Secondary | ICD-10-CM | POA: Diagnosis not present

## 2019-11-02 NOTE — Patient Instructions (Signed)
Access Code: [email or web address]  URL: [email or web address]  Date: 11/02/2019  Prepared by: Willow Ora   Exercises Seated Heel Toe Raises - 10 reps - 1 sets - 1x daily - 5x weekly Seated Long Arc Quad - 10 reps - 1 sets - 1x daily - 5x weekly Seated Hamstring Curl with Anchored Resistance - 10 reps - 1 sets - 1x daily - 5x weekly Seated March - 10 reps - 1 sets - 1x daily - 5x weekly Seated Hip Abduction with Resistance - 10 reps - 1 sets - 1x daily - 5x weekly Seated Hamstring Stretch - 3 reps - 1 sets - 30 hold - 1x daily - 5x weekly

## 2019-11-03 ENCOUNTER — Ambulatory Visit: Payer: Medicaid Other | Attending: Internal Medicine | Admitting: Internal Medicine

## 2019-11-03 DIAGNOSIS — Z5321 Procedure and treatment not carried out due to patient leaving prior to being seen by health care provider: Secondary | ICD-10-CM

## 2019-11-03 NOTE — Progress Notes (Signed)
Pt states her pain is coming from all over

## 2019-11-03 NOTE — Progress Notes (Signed)
Patient was scheduled for telemetry visit at 9:10 AM.  Patient showed up in person and was waiting to be roomed.  My CMA tells me that she went to get her at about 9:45 AM from the lobby to room her, the patient was walking out the door.  She called to her but patient states that she was leaving and will do a telephone visit.  I tried calling the patient twice shortly thereafter but there was no answer and the voice mailbox was full so I could not leave a message.  I have instructed my CMA to have them reschedule her appointment with me.

## 2019-11-03 NOTE — Therapy (Signed)
[facility] [address] [place], [place], [address] Phone: [phone]   Fax:  [phone]  Physical Therapy Treatment  Patient Details  Name: Robin [name] MRN: [id number] Date of Birth: 03/25/1956 Referring Provider (PT): Karle Plumber, MD   Encounter Date: 11/02/2019  PT End of Session - 11/02/19 1024    Visit Number  2    Number of Visits  12    Date for PT Re-Evaluation  01/08/20    Authorization Type  Medicaid    Authorization Time Period  1/22-2/11/21- 3 visits    Authorization - Visit Number  1    Authorization - Number of Visits  3    PT Start Time  1020    PT Stop Time  1100    PT Time Calculation (min)  40 min    Equipment Utilized During Treatment  Gait belt;Other (comment)   Utlized larger size Blue gait belt; bariatric RW   Activity Tolerance  Patient limited by pain    Behavior During Therapy  [facility] for tasks assessed/performed       Past Medical History:  Diagnosis Date  . Anxiety disorder   . Arthritis   . Asthma   . Chronic neck pain   . Constipation   . Depression   . Diabetes mellitus without complication ([place])   . Dyspnea   . GERD (gastroesophageal reflux disease)   . Hiatal hernia    small  . Hyperlipidemia   . Hypertension   . Hypothyroidism   . Schatzki's ring    non critical  . Sleep apnea    CPAP, Sleep study at [facility]  . Sleep apnea     Past Surgical History:  Procedure Laterality Date  . ABDOMINAL HYSTERECTOMY    . ABDOMINAL HYSTERECTOMY  02/18/2000  . CARDIAC CATHETERIZATION  08/18/2003   normal L main/LAD/L Cfx/RCA (Dr. Adora Fridge)  . COLONOSCOPY  2006   Dr. [name], hyperplastic polyps  . COLONOSCOPY  08/06/11   abnormal terminal ileum for 10cm, erosions, geographical ulceration. Bx small bowel mucosa with prominent intramucosal lymphoid aggregates, slightly inflammed  . DIRECT LARYNGOSCOPY  03/15/2012   Procedure: DIRECT LARYNGOSCOPY;   Surgeon: [name], MD;  Location: [facility];  Service: ENT;  Laterality: N/A; Dr. Wolicki--:>no foreign body seen. normal esophagus to 40cm  . ESOPHAGEAL DILATION  04/17/2015   Procedure: ESOPHAGEAL DILATION;  Surgeon: [name], MD;  [facility];  Service: Endoscopy;;  . ESOPHAGOGASTRODUODENOSCOPY  08/23/2008   [id number] distal esophageal erosions consistent with mild erosive reflux esophagitis, otherwise unremarkable esophagus/ Tiny antral erosions of doubtful clinical significance, otherwise normal stomach, patent pylorus, normal D1 and D2  . ESOPHAGOGASTRODUODENOSCOPY  08/06/11   small hh, noncritical Schatzki's ring s/p 41 F  . ESOPHAGOGASTRODUODENOSCOPY N/A 04/17/2015   [id number] s/p dilation  . ESOPHAGOGASTRODUODENOSCOPY (EGD) WITH PROPOFOL N/A 01/20/2018   Dr. [name]: Erosive gastropathy, normal-appearing esophagus status post empiric dilation.  Chronic gastritis, no H. pylori.  . ESOPHAGOSCOPY  03/15/2012   Procedure: ESOPHAGOSCOPY;  Surgeon: [name], MD;  Location: [facility];  Service: ENT;  Laterality: N/A;  . KNEE ARTHROSCOPY  04/27/2001  . MALONEY DILATION N/A 01/20/2018   Procedure: Venia Minks DILATION;  Surgeon: [name], MD;  [facility];  Service: Endoscopy;  Laterality: N/A;  . NM MYOCAR PERF WALL MOTION  2003   negative bruce protocol exercise tress test; EF 68%; intermediate risk study due to evidence of  anterior wall ischemia extending from mid-ventricle to apex  . PITUITARY SURGERY  06/2012   benign tumor, surgeon at [facility]  . RECTOCELE REPAIR    . TRANSTHORACIC ECHOCARDIOGRAM  2003   EF normal  . VAGINAL PROLAPSE REPAIR    . VIDEO BRONCHOSCOPY Bilateral 03/08/2017   Procedure: VIDEO BRONCHOSCOPY WITHOUT FLUORO;  Surgeon: Javier Glazier, MD;  Location: [facility];  Service: Cardiopulmonary;  Laterality: Bilateral;    There were no vitals filed for this visit.  Subjective Assessment - 11/02/19 1022    Subjective  No new complaints.   No falls. 9/10 pain in bil hips and knees.    Patient is accompained by:  Family member   spouse in lobby   Pertinent History  PMH: HTN, pre-DM, OSA, fibromyalgia, depression, obesity,  GERD, hypothyroid, OA of multiple js (hands, knees, shoulders)MRI pituitary adenoma: stable; pinched nerve in low back    How long can you stand comfortably?  5 min    How long can you walk comfortably?  Walks if she needs to; always having pain    Patient Stated Goals  Pt's goal for therapy is to try to get around better than I am now.    Currently in Pain?  Yes    Pain Score  9     Pain Location  Generalized    Pain Orientation  Right;Left    Pain Descriptors / Indicators  Aching;Tender    Pain Type  Chronic pain    Pain Onset  More than a month ago    Pain Frequency  Constant    Aggravating Factors   arthritis, cold weather    Pain Relieving Factors  Medication helps some, resting            [facility] Adult PT Treatment/Exercise - 11/02/19 1026      Transfers   Transfers  Sit to Stand;Stand to [employer] Transfers    Sit to Stand  4: Min guard    Sit to Stand Details  Verbal cues for sequencing;Verbal cues for technique;Verbal cues for precautions/safety;Verbal cues for safe use of DME/AE    Stand to Sit  4: Min guard    Stand to Sit Details (indicate cue type and reason)  Verbal cues for sequencing;Verbal cues for technique;Verbal cues for precautions/safety;Verbal cues for safe use of DME/AE    Stand Pivot Transfers  4: Min assist    Stand Pivot Transfer Details (indicate cue type and reason)  with RW to take 4-5 pivot steps wheelchair<>mat table with cues on posture, technique and walker use/position with transfers.       Exercises   Exercises  --    Other Exercises   issued HEP to address stretching and strengthening of LE's. Refer to [facility] program for full details. Cues needed for correct form and technique in session with pt performing 10 reps of each one.         Access Code:  [email or web address]  URL: [email or web address]  Date: 11/02/2019  Prepared by: Willow Ora   Exercises Seated Heel Toe Raises - 10 reps - 1 sets - 1x daily - 5x weekly Seated Long Arc Quad - 10 reps - 1 sets - 1x daily - 5x weekly Seated Hamstring Curl with Anchored Resistance - 10 reps - 1 sets - 1x daily - 5x weekly Seated March - 10 reps - 1 sets - 1x daily - 5x weekly Seated Hip Abduction with Resistance - 10 reps - 1 sets - 1x daily - 5x  weekly Seated Hamstring Stretch - 3 reps - 1 sets - 30 hold - 1x daily - 5x weekly       PT Education - 11/02/19 1313    Education Details  HEP for strengthening and stretching    Person(s) Educated  Patient    Methods  Explanation;Demonstration;Verbal cues    Comprehension  Verbalized understanding;Returned demonstration;Verbal cues required;Need further instruction       PT Short Term Goals - 10/10/19 1508      PT SHORT TERM GOAL #1   Title  Pt will be independent with HEP to address strength, flexibility, balance, and gait progression.  TARGET 11/03/2019    Baseline  No current HEP    Time  3    Period  Weeks    Status  New    Target Date  11/03/19      PT SHORT TERM GOAL #2   Title  Pt will be ambulate at least 15 ft using RW (vs cane), min assist for improved gait within home.    Baseline  4 ft min/mod assist at eval with RW    Time  3    Period  Weeks    Status  New    Target Date  11/03/19      PT SHORT TERM GOAL #3   Title  Pt will perform sit<>stand with minimal assistance for improved transfer efficiency and safety.    Baseline  mod assist    Time  3    Period  Weeks    Status  New    Target Date  11/03/19        PT Long Term Goals - 10/10/19 1510      PT LONG TERM GOAL #1   Title  Pt will be independent with progression of HEP for improved strength, flexibility, balance, and gait.  TARGET 12/04/2019    Baseline  No current HEP    Time  7    Period  Weeks    Status  New    Target Date  12/04/19      PT  LONG TERM GOAL #2   Title  Pt will ambulate at least 40 ft using RW with min guard assistance, for improved gait efficiency and safety in the home.    Baseline  4 ft min/mod assist RW at eval    Time  7    Period  Weeks    Status  New    Target Date  12/04/19      PT LONG TERM GOAL #3   Title  Pt will improve sit<>stand to supervision level, for improved transfer efficiency and safety.    Baseline  mod assist sit<>stand at eval    Time  7    Period  Weeks    Status  New    Target Date  12/04/19      PT LONG TERM GOAL #4   Title  Pt will perform at least 10 minutes of standing activities with UE support, for improved participation in ADLs and household tasks.    Baseline  Pt reports standing no more than 5 minutes at a time, limited due to pain    Time  7    Period  Weeks    Status  New    Target Date  12/04/19      PT LONG TERM GOAL #5   Title  Pt will negotiate at least 3 steps with cane and HHA for improved negotiation of stairs into  and out of home.    Baseline  Uses cane, banister and assist of family member at home    Time  7    Period  Weeks    Status  New    Target Date  12/04/19            Plan - 11/02/19 1025    Clinical Impression Statement  Today's skilled session continued to work on transfers and initiated HEP to address LE stretching/strengthening. Pt reported mild increase in pain while performing some ex's that resolved to baseline with rest breaks. The pt is making progress toward goals and should benefit from continued PT to progress toward unmet goals.    Personal Factors and Comorbidities  Comorbidity 3+    Comorbidities  HTN, pre-DM, OSA, fibromyalgia, depression, obesity,  GERD, hypothyroid, OA of multiple js (hands, knees, shoulders)MRI pituitary adenoma: stable    Examination-Activity Limitations  Stand;Stairs;Locomotion Level;Transfers    Examination-Participation Restrictions  Community Activity;Meal Prep;Other   Standing ADLs    Stability/Clinical Decision Making  Evolving/Moderate complexity    Rehab Potential  Good    PT Frequency  Other (comment)   1x/wk for 3 weeks; then 2x/wk for 4 weeks   PT Duration  Other (comment)   7 weeks total POC   PT Treatment/Interventions  ADLs/Self Care Home Management;Aquatic Therapy;Gait training;Stair training;Functional mobility training;Therapeutic activities;Therapeutic exercise;Balance training;Neuromuscular re-education;Patient/family education    PT Next Visit Plan  gait and stair training (currently pt is using banister and cane for 3-4 steps at home); standing balance, standing tolerance    PT Home Exercise Plan  Access Code: [email or web address]    Consulted and Agree with Plan of Care  Patient;Family member/caregiver    Family Member Consulted  Husband       Patient will benefit from skilled therapeutic intervention in order to improve the following deficits and impairments:  Abnormal gait, Decreased range of motion, Difficulty walking, Decreased endurance, Decreased activity tolerance, Pain, Decreased balance, Impaired flexibility, Decreased mobility, Decreased strength  Visit Diagnosis: Difficulty in walking, not elsewhere classified  Muscle weakness (generalized)  Unsteadiness on feet     Problem List Patient Active Problem List   Diagnosis Date Noted  . Multilevel degenerative disc disease 10/31/2018  . Abdominal pain 10/28/2018  . Change in bowel function 10/28/2018  . Gastritis and gastroduodenitis 04/26/2018  . Atrophic vaginitis 04/01/2018  . Diabetes mellitus ([place]) 03/31/2018  . Upper airway cough syndrome 02/08/2018  . Dysphagia 12/14/2017  . 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 Urinary incontinence 07/23/2017  . Early satiety 06/16/2017  . Hx of iron deficiency anemia 05/28/2017  . Throat pain in adult 04/05/2017  . Prediabetes 02/04/2017  . Dyspnea on exertion 12/23/2016  . Chronic allergic rhinitis 12/23/2016  . Hemoptysis 12/23/2016  . Fibromyalgia  08/18/2016  . Chronic lymphocytic thyroiditis 08/18/2016  . Depression 04/01/2016  . OSA (obstructive sleep apnea) 04/01/2016  . Essential hypertension 12/09/2015  . Hypothyroidism 12/09/2015  . Morbid obesity due to excess calories ([place]) 12/09/2015  . Constipation 05/27/2015  . Fatty liver 05/27/2015  . Abdominal pain, chronic, epigastric 07/04/2012  . Anxiety 06/09/2012  . History of gastroesophageal reflux (GERD) 06/09/2012  . Hyperlipidemia 06/09/2012  . Refusal of blood transfusions as patient is Jehovah's Witness 06/09/2012  . 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 Abnormal small bowel biopsy 09/18/2011  . Lactose intolerance 09/18/2011  . GERD 01/21/2010    Willow Ora, PTA, [facility] [address], [place], [place] [address] [phone] 11/03/19,  1:15 PM   Name: Robin [name] MRN: [id number] Date of Birth: 17-Mar-1956

## 2019-11-08 MED FILL — ATORVASTATIN CALCIUM 20 MG: 20 | 30 days supply | Qty: 15 | Fill #2

## 2019-11-08 MED FILL — metFORMIN HCL 500 MG TABS: 500 | 30 days supply | Qty: 15 | Fill #2

## 2019-11-09 ENCOUNTER — Other Ambulatory Visit: Payer: Self-pay

## 2019-11-09 ENCOUNTER — Ambulatory Visit: Payer: Medicaid Other | Attending: Internal Medicine | Admitting: Physical Therapy

## 2019-11-09 DIAGNOSIS — R2681 Unsteadiness on feet: Secondary | ICD-10-CM | POA: Insufficient documentation

## 2019-11-09 DIAGNOSIS — R262 Difficulty in walking, not elsewhere classified: Secondary | ICD-10-CM | POA: Insufficient documentation

## 2019-11-09 DIAGNOSIS — M6281 Muscle weakness (generalized): Secondary | ICD-10-CM | POA: Insufficient documentation

## 2019-11-09 NOTE — Therapy (Signed)
[facility] [address] [place], [place], [address] Phone: [phone]   Fax:  [phone]  Physical Therapy Treatment  Patient Details  Name: Robin [name] MRN: [id number] Date of Birth: 14-May-1956 Referring Provider (PT): Karle Plumber, MD   Encounter Date: 11/09/2019  PT End of Session - 11/09/19 2035    Visit Number  3    Number of Visits  12    Date for PT Re-Evaluation  01/08/20    Authorization Type  Medicaid    Authorization Time Period  1/22-2/11/21- 3 visits    Authorization - Visit Number  2    Authorization - Number of Visits  3    PT Start Time  [id number]    PT Stop Time  1146    PT Time Calculation (min)  43 min    Equipment Utilized During Treatment  Gait belt;Other (comment)   Utlized larger size Blue gait belt; bariatric RW   Activity Tolerance  Patient limited by pain    Behavior During Therapy  [facility] for tasks assessed/performed       Past Medical History:  Diagnosis Date  . Anxiety disorder   . Arthritis   . Asthma   . Chronic neck pain   . Constipation   . Depression   . Diabetes mellitus without complication ([place])   . Dyspnea   . GERD (gastroesophageal reflux disease)   . Hiatal hernia    small  . Hyperlipidemia   . Hypertension   . Hypothyroidism   . Schatzki's ring    non critical  . Sleep apnea    CPAP, Sleep study at [place]  . Sleep apnea     Past Surgical History:  Procedure Laterality Date  . ABDOMINAL HYSTERECTOMY    . ABDOMINAL HYSTERECTOMY  02/18/2000  . CARDIAC CATHETERIZATION  08/18/2003   normal L main/LAD/L Cfx/RCA (Dr. Adora Fridge)  . COLONOSCOPY  2006   Dr. [name], hyperplastic polyps  . COLONOSCOPY  08/06/11   abnormal terminal ileum for 10cm, erosions, geographical ulceration. Bx small bowel mucosa with prominent intramucosal lymphoid aggregates, slightly inflammed  . DIRECT LARYNGOSCOPY  03/15/2012   Procedure: DIRECT LARYNGOSCOPY;   Surgeon: [name], MD;  Location: [facility];  Service: ENT;  Laterality: N/A; Dr. Wolicki--:>no foreign body seen. normal esophagus to 40cm  . ESOPHAGEAL DILATION  04/17/2015   Procedure: ESOPHAGEAL DILATION;  Surgeon: [name], MD;  [facility];  Service: Endoscopy;;  . ESOPHAGOGASTRODUODENOSCOPY  08/23/2008   [id number] distal esophageal erosions consistent with mild erosive reflux esophagitis, otherwise unremarkable esophagus/ Tiny antral erosions of doubtful clinical significance, otherwise normal stomach, patent pylorus, normal D1 and D2  . ESOPHAGOGASTRODUODENOSCOPY  08/06/11   small hh, noncritical Schatzki's ring s/p 74 F  . ESOPHAGOGASTRODUODENOSCOPY N/A 04/17/2015   [id number] s/p dilation  . ESOPHAGOGASTRODUODENOSCOPY (EGD) WITH PROPOFOL N/A 01/20/2018   Dr. [name]: Erosive gastropathy, normal-appearing esophagus status post empiric dilation.  Chronic gastritis, no H. pylori.  . ESOPHAGOSCOPY  03/15/2012   Procedure: ESOPHAGOSCOPY;  Surgeon: [name], MD;  Location: [facility];  Service: ENT;  Laterality: N/A;  . KNEE ARTHROSCOPY  04/27/2001  . MALONEY DILATION N/A 01/20/2018   Procedure: Venia Minks DILATION;  Surgeon: [name], MD;  [facility];  Service: Endoscopy;  Laterality: N/A;  . NM MYOCAR PERF WALL MOTION  2003   negative bruce protocol exercise tress test; EF 68%; intermediate risk study due to evidence of  anterior wall ischemia extending from mid-ventricle to apex  . PITUITARY SURGERY  06/2012   benign tumor, surgeon at [facility]  . RECTOCELE REPAIR    . TRANSTHORACIC ECHOCARDIOGRAM  2003   EF normal  . VAGINAL PROLAPSE REPAIR    . VIDEO BRONCHOSCOPY Bilateral 03/08/2017   Procedure: VIDEO BRONCHOSCOPY WITHOUT FLUORO;  Surgeon: Javier Glazier, MD;  Location: [facility];  Service: Cardiopulmonary;  Laterality: Bilateral;    There were no vitals filed for this visit.  Subjective Assessment - 11/09/19 1106    Subjective  Have tried the  exercises, and they actually weren't too bad.  No falls, no other changes    Patient is accompained by:  Family member   spouse in lobby   Pertinent History  PMH: HTN, pre-DM, OSA, fibromyalgia, depression, obesity,  GERD, hypothyroid, OA of multiple js (hands, knees, shoulders)MRI pituitary adenoma: stable; pinched nerve in low back    How long can you stand comfortably?  5 min    How long can you walk comfortably?  Walks if she needs to; always having pain    Patient Stated Goals  Pt's goal for therapy is to try to get around better than I am now.    Currently in Pain?  Yes    Pain Score  9     Pain Location  Generalized    Pain Orientation  Right;Left    Pain Descriptors / Indicators  Aching;Tender    Pain Type  Chronic pain    Pain Onset  More than a month ago    Aggravating Factors   arthritis, cold weather    Pain Relieving Factors  Medication helps, resting, exercises helped       Therapeutic Activity: Additional 3 reps sit<>stand from mat surface, cues for hand placement, forward lean.  Upon standing, pt stands at RW, 10 seconds (1st trial), then 30 seconds (next 2 trials).  While standing, pt performs lateral weightshifting x 5 reps.  Therapeutic Exercise:  REviewed HEP given last visit: Seated Heel Toe Raises - 10 reps - 1 sets - 1x daily - 5x weekly Seated Long Arc Quad - 10 reps - 1 sets - 1x daily - 5x weekly Seated Hamstring Curl with Anchored Resistance - 10 reps - 1 sets - 1x daily - 5x weekly (yellow theraband) Seated March - 10 reps - 1 sets - 1x daily - 5x weekly Seated Hip Abduction with Resistance - 10 reps - 1 sets - 1x daily - 5x weekly Seated Hamstring Stretch - 3 reps - 1 sets - 30 hold - 1x daily - 5x weekly  Pt return demo understanding of HEP.  Also performed seated side step out and in, x 10 reps each side; seated hip adduction x 10 reps, 2 sets, using small ball.                 [facility] Adult PT Treatment/Exercise - 11/09/19 0001       Transfers   Transfers  Sit to Stand;Stand to Sit;Stand Pivot Transfers    Sit to Stand  4: Min guard;With upper extremity assist;From bed    Stand to Sit  4: Min guard;Without upper extremity assist;To bed    Stand Pivot Transfers  4: Min assist   w/c to mat table     Ambulation/Gait   Ambulation/Gait  Yes    Ambulation/Gait Assistance  4: Min assist;3: Mod assist    Ambulation Distance (Feet)  12 Feet   10   Assistive  device  Rolling walker   Bariatric RW and blue gait belt   Gait Pattern  Step-through pattern;Decreased step length - right;Decreased step length - left;Decreased hip/knee flexion - right;Decreased hip/knee flexion - left;Decreased dorsiflexion - right;Decreased dorsiflexion - left;Right flexed knee in stance;Left flexed knee in stance;Trunk flexed;Poor foot clearance - left;Poor foot clearance - right;Antalgic    Ambulation Surface  Level;Indoor               PT Short Term Goals - 10/10/19 1508      PT SHORT TERM GOAL #1   Title  Pt will be independent with HEP to address strength, flexibility, balance, and gait progression.  TARGET 11/03/2019    Baseline  No current HEP    Time  3    Period  Weeks    Status  New    Target Date  11/03/19      PT SHORT TERM GOAL #2   Title  Pt will be ambulate at least 15 ft using RW (vs cane), min assist for improved gait within home.    Baseline  4 ft min/mod assist at eval with RW    Time  3    Period  Weeks    Status  New    Target Date  11/03/19      PT SHORT TERM GOAL #3   Title  Pt will perform sit<>stand with minimal assistance for improved transfer efficiency and safety.    Baseline  mod assist    Time  3    Period  Weeks    Status  New    Target Date  11/03/19        PT Long Term Goals - 10/10/19 1510      PT LONG TERM GOAL #1   Title  Pt will be independent with progression of HEP for improved strength, flexibility, balance, and gait.  TARGET 12/04/2019    Baseline  No current HEP    Time  7     Period  Weeks    Status  New    Target Date  12/04/19      PT LONG TERM GOAL #2   Title  Pt will ambulate at least 40 ft using RW with min guard assistance, for improved gait efficiency and safety in the home.    Baseline  4 ft min/mod assist RW at eval    Time  7    Period  Weeks    Status  New    Target Date  12/04/19      PT LONG TERM GOAL #3   Title  Pt will improve sit<>stand to supervision level, for improved transfer efficiency and safety.    Baseline  mod assist sit<>stand at eval    Time  7    Period  Weeks    Status  New    Target Date  12/04/19      PT LONG TERM GOAL #4   Title  Pt will perform at least 10 minutes of standing activities with UE support, for improved participation in ADLs and household tasks.    Baseline  Pt reports standing no more than 5 minutes at a time, limited due to pain    Time  7    Period  Weeks    Status  New    Target Date  12/04/19      PT LONG TERM GOAL #5   Title  Pt will negotiate at least 3 steps with cane and HHA  for improved negotiation of stairs into and out of home.    Baseline  Uses cane, banister and assist of family member at home    Time  7    Period  Weeks    Status  New    Target Date  12/04/19            Plan - 11/09/19 2036    Clinical Impression Statement  Skilled PT session today focused on review of HEP, sit<>stand and standing tolerance, as well as gait training.  Pt overall reports pain decreases with exercises (reports 9/10>8/10 pain at end of session).  She will continue to beneift from skilled PT to address strength for standing and gait.    Personal Factors and Comorbidities  Comorbidity 3+    Comorbidities  HTN, pre-DM, OSA, fibromyalgia, depression, obesity,  GERD, hypothyroid, OA of multiple js (hands, knees, shoulders)MRI pituitary adenoma: stable    Examination-Activity Limitations  Stand;Stairs;Locomotion Level;Transfers    Examination-Participation Restrictions  Community Activity;Meal Prep;Other    Standing ADLs   Stability/Clinical Decision Making  Evolving/Moderate complexity    Rehab Potential  Good    PT Frequency  Other (comment)   1x/wk for 3 weeks; then 2x/wk for 4 weeks   PT Duration  Other (comment)   7 weeks total POC   PT Treatment/Interventions  ADLs/Self Care Home Management;Aquatic Therapy;Gait training;Stair training;Functional mobility training;Therapeutic activities;Therapeutic exercise;Balance training;Neuromuscular re-education;Patient/family education    PT Next Visit Plan  Need to assess STGs next visit, make more appts, submit to Medicaid for more visits; add hip adduction, seated glut sets to HEP as able; standing tolerance/balance/gait and stairs as able    PT Home Exercise Plan  Access Code: [email or web address]    Consulted and Agree with Plan of Care  Patient       Patient will benefit from skilled therapeutic intervention in order to improve the following deficits and impairments:  Abnormal gait, Decreased range of motion, Difficulty walking, Decreased endurance, Decreased activity tolerance, Pain, Decreased balance, Impaired flexibility, Decreased mobility, Decreased strength  Visit Diagnosis: Muscle weakness (generalized)  Unsteadiness on feet  Difficulty in walking, not elsewhere classified     Problem List Patient Active Problem List   Diagnosis Date Noted  . Multilevel degenerative disc disease 10/31/2018  . Abdominal pain 10/28/2018  . Change in bowel function 10/28/2018  . Gastritis and gastroduodenitis 04/26/2018  . Atrophic vaginitis 04/01/2018  . Diabetes mellitus ([place]) 03/31/2018  . Upper airway cough syndrome 02/08/2018  . Dysphagia 12/14/2017  . 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 Urinary incontinence 07/23/2017  . Early satiety 06/16/2017  . Hx of iron deficiency anemia 05/28/2017  . Throat pain in adult 04/05/2017  . Prediabetes 02/04/2017  . Dyspnea on exertion 12/23/2016  . Chronic allergic rhinitis 12/23/2016  . Hemoptysis 12/23/2016  .  Fibromyalgia 08/18/2016  . Chronic lymphocytic thyroiditis 08/18/2016  . Depression 04/01/2016  . OSA (obstructive sleep apnea) 04/01/2016  . Essential hypertension 12/09/2015  . Hypothyroidism 12/09/2015  . Morbid obesity due to excess calories ([place]) 12/09/2015  . Constipation 05/27/2015  . Fatty liver 05/27/2015  . Abdominal pain, chronic, epigastric 07/04/2012  . Anxiety 06/09/2012  . History of gastroesophageal reflux (GERD) 06/09/2012  . Hyperlipidemia 06/09/2012  . Refusal of blood transfusions as patient is Jehovah's Witness 06/09/2012  . Pituitary macroadenoma ([place]) 04/04/2012  . Abnormal small bowel biopsy 09/18/2011  . Lactose intolerance 09/18/2011  . GERD 01/21/2010    Charlsey Moragne W. 11/09/2019, 8:40 PM  Frazier Butt., PT  [facility] [address] [place], [place], [address] Phone: [phone]   Fax:  [phone]  Name: Robin [name] MRN: [id number] Date of Birth: Mar 19, 1956

## 2019-11-11 ENCOUNTER — Other Ambulatory Visit (HOSPITAL_COMMUNITY): Payer: Medicaid Other

## 2019-11-13 ENCOUNTER — Encounter (HOSPITAL_BASED_OUTPATIENT_CLINIC_OR_DEPARTMENT_OTHER): Payer: Medicaid Other | Admitting: Cardiovascular Disease

## 2019-11-13 ENCOUNTER — Ambulatory Visit: Payer: Medicaid Other | Attending: Internal Medicine | Admitting: Internal Medicine

## 2019-11-13 ENCOUNTER — Encounter: Payer: Self-pay | Admitting: Internal Medicine

## 2019-11-13 ENCOUNTER — Other Ambulatory Visit: Payer: Self-pay

## 2019-11-13 VITALS — BP 120/64 | HR 60 | Temp 98.0°F | Resp 16

## 2019-11-13 DIAGNOSIS — G4733 Obstructive sleep apnea (adult) (pediatric): Secondary | ICD-10-CM | POA: Insufficient documentation

## 2019-11-13 DIAGNOSIS — Z9889 Other specified postprocedural states: Secondary | ICD-10-CM

## 2019-11-13 DIAGNOSIS — Z7984 Long term (current) use of oral hypoglycemic drugs: Secondary | ICD-10-CM | POA: Insufficient documentation

## 2019-11-13 DIAGNOSIS — R0609 Other forms of dyspnea: Secondary | ICD-10-CM | POA: Insufficient documentation

## 2019-11-13 DIAGNOSIS — Z7982 Long term (current) use of aspirin: Secondary | ICD-10-CM | POA: Diagnosis not present

## 2019-11-13 DIAGNOSIS — G8929 Other chronic pain: Secondary | ICD-10-CM

## 2019-11-13 DIAGNOSIS — R7303 Prediabetes: Secondary | ICD-10-CM | POA: Diagnosis not present

## 2019-11-13 DIAGNOSIS — Z79890 Hormone replacement therapy (postmenopausal): Secondary | ICD-10-CM | POA: Insufficient documentation

## 2019-11-13 DIAGNOSIS — Z87891 Personal history of nicotine dependence: Secondary | ICD-10-CM | POA: Diagnosis not present

## 2019-11-13 DIAGNOSIS — M25511 Pain in right shoulder: Secondary | ICD-10-CM | POA: Diagnosis not present

## 2019-11-13 DIAGNOSIS — D352 Benign neoplasm of pituitary gland: Secondary | ICD-10-CM | POA: Diagnosis not present

## 2019-11-13 DIAGNOSIS — Z888 Allergy status to other drugs, medicaments and biological substances status: Secondary | ICD-10-CM | POA: Diagnosis not present

## 2019-11-13 DIAGNOSIS — M797 Fibromyalgia: Secondary | ICD-10-CM | POA: Diagnosis not present

## 2019-11-13 DIAGNOSIS — R0600 Dyspnea, unspecified: Secondary | ICD-10-CM | POA: Diagnosis not present

## 2019-11-13 DIAGNOSIS — Z8249 Family history of ischemic heart disease and other diseases of the circulatory system: Secondary | ICD-10-CM | POA: Insufficient documentation

## 2019-11-13 DIAGNOSIS — I10 Essential (primary) hypertension: Secondary | ICD-10-CM

## 2019-11-13 DIAGNOSIS — M160 Bilateral primary osteoarthritis of hip: Secondary | ICD-10-CM | POA: Diagnosis not present

## 2019-11-13 DIAGNOSIS — E785 Hyperlipidemia, unspecified: Secondary | ICD-10-CM | POA: Diagnosis not present

## 2019-11-13 DIAGNOSIS — F329 Major depressive disorder, single episode, unspecified: Secondary | ICD-10-CM

## 2019-11-13 DIAGNOSIS — Z1231 Encounter for screening mammogram for malignant neoplasm of breast: Secondary | ICD-10-CM

## 2019-11-13 DIAGNOSIS — Z833 Family history of diabetes mellitus: Secondary | ICD-10-CM | POA: Diagnosis not present

## 2019-11-13 DIAGNOSIS — Z79899 Other long term (current) drug therapy: Secondary | ICD-10-CM | POA: Diagnosis not present

## 2019-11-13 DIAGNOSIS — E039 Hypothyroidism, unspecified: Secondary | ICD-10-CM

## 2019-11-13 LAB — GLUCOSE, POCT (MANUAL RESULT ENTRY): POC Glucose: 128 mg/dl — AB (ref 70–99)

## 2019-11-13 NOTE — Progress Notes (Signed)
Patient ID: Robin [name], female    DOB: 20-Mar-1956  MRN: [id number]  CC: Follow-up (14 weeks )   Subjective: Robin [name] is a 64 y.o. female who presents for chronic disease management.  Last seen 07/2019 Her concerns today include:  Patient with history ofHTN, pre-DM,OSA, fibromyalgia, depression, obesity, GERD, hypothyroid, OA of multiple js (hands, knees, shoulders, hips), pituitary adenoma status post resection  DOE:  Saw Dr. Gwenlyn Found on 10/31/2019.  He felt her dyspnea on exertion was most likely related to obesity.  Obesity/PreDM: "My eating is just all over the place."  She is in a bariatric program.  They are working with her on portion sizes and she sees a Social worker through their program.  She states that she does qualify to have bariatric surgery but they are working with her first to satisfy all requirements.  Saw Dr Christella Noa for cervical radiculopathy.  His assessment was that the pain she is having is coming from her shoulders and not the neck and recommended that she has her shoulders evaluated.  She has known arthritis in the shoulders.  Shoulders hurt, RT>LT. limited range of motion in the right shoulder. She is in a motorized wheelchair today.  C/o pain BL hips LT>RT for yrs. and feels it is getting worse.  Dr. Christella Noa noted on a previous CAT scan of the abdomen that she had significant arthritis in the hips.  She tells me he has told her that she likely needs hip replacement surgery Has motorized WC (given to her as gift last yr)  and she has started using it but not much.  Has a walker and cane.  No recent falls.    HYPERTENSION Currently taking: see medication list Med Adherence: [x]  Yes    []  No Medication side effects: []  Yes    [x]  No Adherence with salt restriction: [x]  Yes    []  No Home Monitoring?: [x]  Yes    []  No Monitoring Frequency: every morning Home BP results range: 130-139/65-70s SOB? [x]  Yes  And the wheezing is getting worse. Albuterol does not work  well.  Has appt with [facility] in March Chest Pain?: []  Yes    [x]  No Leg swelling?: []  Yes    [x]  No Headaches?: []  Yes    [x]  No Dizziness? [x]  Yes    []  No Comments:   Hypothyroid:  On Levothyroxine and taking it consistently  Depression:  Still seeing counselor and will be seeing a psychiatrist this wk. reports compliance with medications  Eye:  Saw ophthalmologist 07/2019 and has a f/u appt next wk  Requesting referral to [facility] for follow-up.  She had a bladder tuck surgery and is due for follow-up.  Pituitary adenoma: Saw Dr. Hall Busing with neurosurgery at [facility] in October of last year.  I have made mention of that visit on patient's last visit with me.  Please refer to my previous note.  Plan is for repeat MRI in 5 years Patient Active Problem List   Diagnosis Date Noted  . Multilevel degenerative disc disease 10/31/2018  . Abdominal pain 10/28/2018  . Change in bowel function 10/28/2018  . Gastritis and gastroduodenitis 04/26/2018  . Atrophic vaginitis 04/01/2018  . Diabetes mellitus ([place]) 03/31/2018  . Upper airway cough syndrome 02/08/2018  . Dysphagia 12/14/2017  . 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 Urinary incontinence 07/23/2017  . Early satiety 06/16/2017  . Hx of iron deficiency anemia 05/28/2017  . Throat pain in adult 04/05/2017  . Prediabetes 02/04/2017  .  Dyspnea on exertion 12/23/2016  . Chronic allergic rhinitis 12/23/2016  . Hemoptysis 12/23/2016  . Fibromyalgia 08/18/2016  . Chronic lymphocytic thyroiditis 08/18/2016  . Depression 04/01/2016  . OSA (obstructive sleep apnea) 04/01/2016  . Essential hypertension 12/09/2015  . Hypothyroidism 12/09/2015  . Morbid obesity due to excess calories ([place]) 12/09/2015  . Constipation 05/27/2015  . Fatty liver 05/27/2015  . Abdominal pain, chronic, epigastric 07/04/2012  . Anxiety 06/09/2012  . History of gastroesophageal reflux (GERD) 06/09/2012  . Hyperlipidemia 06/09/2012  . Refusal of blood  transfusions as patient is Jehovah's Witness 06/09/2012  . 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 Abnormal small bowel biopsy 09/18/2011  . Lactose intolerance 09/18/2011  . GERD 01/21/2010     Current Outpatient Medications on File Prior to Visit  Medication Sig Dispense Refill  . ACCU-CHEK AVIVA PLUS test strip USE AS DIRECTED 100 each 12  . acetaminophen (TYLENOL) 500 MG tablet Take 1,000 mg by mouth 2 (two) times daily.     . Acetaminophen-Codeine 300-30 MG tablet acetaminophen 300 mg-codeine 30 mg tablet    . alum hydroxide-mag trisilicate (GAVISCON) [id number] MG CHEW chewable tablet Chew by mouth.    Marland Kitchen amitriptyline (ELAVIL) 50 MG tablet TAKE 1 TABLET BY MOUTH AT BEDTIME. 30 tablet 2  . amLODipine (NORVASC) 5 MG tablet Take 1 tablet (5 mg total) by mouth daily. *NEEDS OFFICE VISIT-2ND ATTEMPT* 15 tablet 0  . aspirin EC 81 MG tablet Take 81 mg by mouth at bedtime.    Marland Kitchen atorvastatin (LIPITOR) 20 MG tablet Take 0.5 tablets (10 mg total) by mouth daily. 90 tablet 3  . buPROPion (WELLBUTRIN XL) 300 MG 24 hr tablet TAKE 1 TABLET (300 MG TOTAL) BY MOUTH EVERY MORNING. 30 tablet 0  . calcium carbonate (TUMS - DOSED IN MG ELEMENTAL CALCIUM) 500 MG chewable tablet Chew 2 tablets by mouth at bedtime.    . celecoxib (CELEBREX) 100 MG capsule Take 100 mg by mouth 2 (two) times daily.    . cetirizine (ZYRTEC) 10 MG tablet Take 1 tablet (10 mg total) by mouth daily. 30 tablet 1  . chlorpheniramine (CHLOR-TRIMETON) 4 MG tablet Take 4 mg by mouth every 4 (four) hours as needed for allergies.     Marland Kitchen CINNAMON PO Take 1 capsule by mouth daily.    Marland Kitchen dexamethasone (DECADRON) 0.5 MG tablet dexamethasone 0.5 mg tablet    . DEXILANT 60 MG capsule TAKE 1 CAPSULE (60 MG TOTAL) BY MOUTH DAILY. 30 capsule 11  . diclofenac sodium (VOLTAREN) 1 % GEL Apply 2-4 g topically 4 (four) times daily as needed (PAIN.).    Marland Kitchen DULoxetine (CYMBALTA) 30 MG capsule Take 1 capsule (30 mg total) by mouth daily. (Patient taking  differently: Take 30 mg by mouth at bedtime. ) 30 capsule 6  . ergocalciferol (VITAMIN D2) 1.25 MG (50000 UT) capsule ergocalciferol (vitamin D2) 1,250 mcg (50,000 unit) capsule    . fexofenadine (ALLEGRA) 180 MG tablet Take 180 mg by mouth daily as needed for allergies or rhinitis.    . fluticasone (FLONASE) 50 MCG/ACT nasal spray Place 1 spray into both nostrils daily. 16 g 1  . furosemide (LASIX) 40 MG tablet TAKE 1/2 TO 1 TABLET BY MOUTH DAILY FOR LOWER EXTREMITY SWELLING 90 tablet 2  . gabapentin (NEURONTIN) 300 MG capsule TAKE 1 CAPSULE (300 MG TOTAL) BY MOUTH 4 (FOUR) TIMES DAILY. 120 capsule 2  . glucose blood (ONETOUCH ULTRA) test strip Use as instructed 100 each 12  . glucose blood test strip  Accu-Chek Aviva Plus test strips    . hydrALAZINE (APRESOLINE) 50 MG tablet Take 1 tablet (50 mg total) by mouth 3 (three) times daily. 90 tablet 1  . levothyroxine (SYNTHROID) 88 MCG tablet TAKE 1 TABLET BY MOUTH DAILY. 30 tablet 2  . LINZESS 290 MCG CAPS capsule TAKE 1 CAPSULE BY MOUTH DAILY BEFORE BREAKFAST. 90 capsule 3  . metFORMIN (GLUCOPHAGE) 500 MG tablet TAKE 1/2 TABLET DAILY WITH BREAKFAST 15 tablet 2  . Metoprolol Tartrate 75 MG TABS TAKE 1 TABLET BY MOUTH TWICE DAILY. 60 tablet 2  . montelukast (SINGULAIR) 10 MG tablet TAKE 1 TABLET BY MOUTH AT BEDTIME. 30 tablet 2  . Multiple Vitamin (MULTIVITAMIN WITH MINERALS) TABS tablet Take 1 tablet by mouth daily. Alive Women's Multivitamin    . nystatin (MYCOSTATIN/NYSTOP) powder Apply 1 application topically 3 (three) times daily. 15 g 0  . Omega-3 Fatty Acids (FISH OIL) 1000 MG CAPS Take 1,000 mg by mouth every evening.    Marland Kitchen OVER THE COUNTER MEDICATION Take 1 capsule by mouth daily. BEET ROOT    . oxyCODONE-acetaminophen (PERCOCET) 10-325 MG tablet Take 1 tablet by mouth 2 (two) times daily.     . polyethylene glycol-electrolytes (TRILYTE) 420 g solution Take 4,000 mLs by mouth as directed. 4000 mL 0  . PREMARIN vaginal cream Place 1  Applicatorful vaginally at bedtime.   3  . PROVENTIL HFA 108 (90 Base) MCG/ACT inhaler INHALE 2 PUFFS INTO THE LUNGS EVERY 6 HOURS AS NEEDED FOR WHEEZING OR SHORTNESS OF BREATH. (Patient taking differently: Inhale 2 puffs into the lungs every 6 (six) hours as needed for wheezing or shortness of breath. ) 6.7 g 2  . sodium chloride (OCEAN) 0.65 % SOLN nasal spray Place 2 sprays into both nostrils every 4 (four) hours as needed for congestion.    . vitamin B-12 (CYANOCOBALAMIN) 1000 MCG tablet Take 1,000 mcg by mouth daily.    Marland Kitchen spironolactone (ALDACTONE) 25 MG tablet Take 1 tablet (25 mg total) by mouth daily. 30 tablet 2   No current facility-administered medications on file prior to visit.    Allergies  Allergen Reactions  . Celexa [Citalopram Hydrobromide] Other (See Comments)    "Made me feel out of it"  . Gabapentin     Contributed to lower extremity edema  . Norvasc [Amlodipine Besylate]     Edema in lower extremities  . Diflucan [Fluconazole] Rash    Social History   Socioeconomic History  . Marital status: Married    Spouse name: Not on file  . Number of children: 4  . Years of education: 10  . Highest education level: Not on file  Occupational History    Employer: UNEMPLOYED  Tobacco Use  . Smoking status: Former Smoker    Packs/day: 0.50    Years: 10.00    Pack years: 5.00    Types: Cigarettes    Start date: 07/14/1975    Quit date: 04/04/1993    Years since quitting: 26.6  . Smokeless tobacco: Never Used  . Tobacco comment: 17 yrs ago  Substance and Sexual Activity  . Alcohol use: No    Alcohol/week: 0.0 standard drinks  . Drug use: No  . Sexual activity: Not on file  Other Topics Concern  . Not on file  Social History Narrative   Lives with husband in an apartment on the first floor.  Has 4 children.     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  Trying to get disability.  Formerly worked as a Teacher, early years/pre.  Education: 11th grade.       [place] [facility] (12/23/16):   Originally from [facility]. She was raised in [place]. 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 No pets currently. No bird exposure. She does have mold in her current home in the bathroom, laundry room, & master bedroom.    Social Determinants of Health   Financial Resource Strain:   . Difficulty of Paying Living Expenses: Not on file  Food Insecurity:   . Worried About Charity fundraiser in the Last Year: Not on file  . Ran Out of Food in the Last Year: Not on file  Transportation Needs:   . Lack of Transportation (Medical): Not on file  . Lack of Transportation (Non-Medical): Not on file  Physical Activity:   . Days of Exercise per Week: Not on file  . Minutes of Exercise per Session: Not on file  Stress:   . Feeling of Stress : Not on file  Social Connections:   . Frequency of Communication with Friends and Family: Not on file  . Frequency of Social Gatherings with Friends and Family: Not on file  . Attends Religious Services: Not on file  . Active Member of Clubs or Organizations: Not on file  . Attends Archivist Meetings: Not on file  . Marital Status: Not on file  Intimate Partner Violence:   . Fear of Current or Ex-Partner: Not on file  . Emotionally Abused: Not on file  . Physically Abused: Not on file  . Sexually Abused: Not on file    Family History  Problem Relation Age of Onset  . Kidney cancer Father   . Hypertension Father   . Hyperlipidemia Father   . Sleep apnea Father   . Diabetes Mother   . Hypertension Mother   . Heart Problems Mother   . Hyperlipidemia Mother   . Asthma Mother   . Sleep apnea Mother   . Liver disease Brother   . Arthritis Brother   . Hypertension Sister   . Allergic rhinitis Sister   . Stomach cancer Other        aunt  . Breast cancer Maternal Grandmother   .  Kidney disease Maternal Grandfather   . Breast cancer Paternal Grandmother   . Hypertension Brother   . Hyperlipidemia Brother   . Hypertension Brother   . Hypertension Sister   . Hyperlipidemia Sister   . Lupus Maternal Aunt   . Colon cancer Neg Hx   . Eczema Neg Hx   . Immunodeficiency Neg Hx   . Urticaria Neg Hx     Past Surgical History:  Procedure Laterality Date  . ABDOMINAL HYSTERECTOMY    . ABDOMINAL HYSTERECTOMY  02/18/2000  . CARDIAC CATHETERIZATION  08/18/2003   normal L main/LAD/L Cfx/RCA (Dr. Adora Fridge)  . COLONOSCOPY  2006   Dr. [name], hyperplastic polyps  . COLONOSCOPY  08/06/11   abnormal terminal ileum for 10cm, erosions, geographical ulceration. Bx small bowel mucosa with prominent intramucosal lymphoid aggregates, slightly inflammed  . DIRECT LARYNGOSCOPY  03/15/2012   Procedure: DIRECT LARYNGOSCOPY;  Surgeon: [name], MD;  Location: [facility];  Service: ENT;  Laterality: N/A; Dr. Wolicki--:>no foreign body seen. normal esophagus to 40cm  . ESOPHAGEAL DILATION  04/17/2015  Procedure: ESOPHAGEAL DILATION;  Surgeon: [name], MD;  [facility];  Service: Endoscopy;;  . ESOPHAGOGASTRODUODENOSCOPY  08/23/2008   [id number] distal esophageal erosions consistent with mild erosive reflux esophagitis, otherwise unremarkable esophagus/ Tiny antral erosions of doubtful clinical significance, otherwise normal stomach, patent pylorus, normal D1 and D2  . ESOPHAGOGASTRODUODENOSCOPY  08/06/11   small hh, noncritical Schatzki's ring s/p 35 F  . ESOPHAGOGASTRODUODENOSCOPY N/A 04/17/2015   [id number] s/p dilation  . ESOPHAGOGASTRODUODENOSCOPY (EGD) WITH PROPOFOL N/A 01/20/2018   Dr. [name]: Erosive gastropathy, normal-appearing esophagus status post empiric dilation.  Chronic gastritis, no H. pylori.  . ESOPHAGOSCOPY  03/15/2012   Procedure: ESOPHAGOSCOPY;  Surgeon: [name], MD;  Location: [facility];  Service: ENT;  Laterality: N/A;  . KNEE ARTHROSCOPY   04/27/2001  . MALONEY DILATION N/A 01/20/2018   Procedure: Venia Minks DILATION;  Surgeon: [name], MD;  [facility];  Service: Endoscopy;  Laterality: N/A;  . [place]  2003   negative bruce protocol exercise tress test; EF 68%; intermediate risk study due to evidence of anterior wall ischemia extending from mid-ventricle to apex  . PITUITARY SURGERY  06/2012   benign tumor, surgeon at [facility]  . RECTOCELE REPAIR    . TRANSTHORACIC ECHOCARDIOGRAM  2003   EF normal  . VAGINAL PROLAPSE REPAIR    . VIDEO BRONCHOSCOPY Bilateral 03/08/2017   Procedure: VIDEO BRONCHOSCOPY WITHOUT FLUORO;  Surgeon: Javier Glazier, MD;  Location: [facility];  Service: Cardiopulmonary;  Laterality: Bilateral;    ROS: Review of Systems Negative except as stated above  PHYSICAL EXAM: BP 120/64   Pulse 60   Temp 98 F (36.7 C)   Resp 16   SpO2 95%   Wt Readings from Last 3 Encounters:  10/31/19 (!) 349 lb 11.2 oz (158.6 kg)  08/29/19 (!) 346 lb 9.6 oz (157.2 kg)  07/03/19 (!) 345 lb (156.5 kg)    Physical Exam  General appearance - alert, well appearing, morbidly obese African-American female and in no distress.  Patient is in a motorized wheelchair Mental status - normal mood, behavior, speech, dress, motor activity, and thought processes Mouth - mucous membranes moist, pharynx normal without lesions Neck - supple, no significant adenopathy Chest - clear to auscultation, no wheezes, rales or rhonchi, symmetric air entry Heart - normal rate, regular rhythm, normal S1, S2, no murmurs, rubs, clicks or gallops Musculoskeletal -shoulders: Mild to moderate tenderness on palpation of the anterior and posterior joints.  She has moderate discomfort with passive range of motion in all direction.  Drop arm test positive bilaterally.   Extremities -no lower extremity edema Depression screen [facility] 2/9 11/13/2019 11/03/2019 07/28/2019  Decreased Interest 3 1 3   Down, Depressed,  Hopeless 3 3 3   PHQ - 2 Score 6 4 6   Altered sleeping 2 3 2   Tired, decreased energy 2 3 3   Change in appetite 2 3 3   Feeling bad or failure about yourself  1 1 1   Trouble concentrating 2 1 2   Moving slowly or fidgety/restless 1 0 2  Suicidal thoughts 0 0 0  PHQ-9 Score 16 15 19   Some recent data might be hidden    CMP Latest Ref Rng & Units 10/25/2019 06/01/2019 04/26/2019  Glucose 70 - 99 mg/dL - 123(H) -  BUN 6 - 20 mg/dL - 14 -  Creatinine 0.44 - 1.00 mg/dL - 0.78 0.90  Sodium 135 - 145 mmol/L - 138 -  Potassium 3.5 - 5.1  mmol/L - 4.2 -  Chloride 98 - 111 mmol/L - 102 -  CO2 22 - 32 mmol/L - 28 -  Calcium 8.9 - 10.3 mg/dL - 9.0 -  Total Protein 6.0 - 8.5 g/dL 7.2 7.3 -  Total Bilirubin 0.0 - 1.2 mg/dL 0.4 0.7 -  Alkaline Phos 39 - 117 IU/L 88 61 -  AST 0 - 40 IU/L 21 19 -  ALT 0 - 32 IU/L 25 25 -   Lipid Panel     Component Value Date/Time   CHOL 141 10/25/2019 1038   TRIG 58 10/25/2019 1038   HDL 58 10/25/2019 1038   CHOLHDL 2.4 10/25/2019 1038   CHOLHDL 2.3 12/16/2015 0950   VLDL 13 12/16/2015 0950   LDLCALC 71 10/25/2019 1038    CBC    Component Value Date/Time   WBC 7.9 06/01/2019 1052   RBC 4.20 06/01/2019 1052   HGB 12.0 06/01/2019 1052   HGB 12.0 01/20/2017 1001   HCT 40.5 06/01/2019 1052   HCT 38.2 01/20/2017 1001   HCT 41 06/25/2011 1053   PLT 310 06/01/2019 1052   PLT 356 01/20/2017 1001   MCV 96.4 06/01/2019 1052   MCV 90 01/20/2017 1001   MCV 91.1 06/25/2011 1053   MCH 28.6 06/01/2019 1052   MCHC 29.6 (L) 06/01/2019 1052   RDW 13.3 06/01/2019 1052   RDW 13.1 01/20/2017 1001   LYMPHSABS 3.3 06/01/2019 1052   LYMPHSABS 3.4 (H) 01/20/2017 1001   MONOABS 1.0 06/01/2019 1052   EOSABS 0.1 06/01/2019 1052   EOSABS 0.1 01/20/2017 1001   BASOSABS 0.0 06/01/2019 1052   BASOSABS 0.0 01/20/2017 1001   Results for orders placed or performed in visit on 11/13/19  POCT glucose (manual entry)  Result Value Ref Range   POC Glucose 128 (A) 70 - 99  mg/dl     ASSESSMENT AND PLAN: 1. Prediabetes 2. Morbid obesity ([place]) Discussed on encourage good eating habits.  Try to avoid snacking Sugar rich foods. She hopes that she can have weight reduction surgery.  She is in a Catch-22 situation because if she has the surgery and not able to exercise because of arthritis in the hips and knees she would not get maximum benefit of the surgery - POCT glucose (manual entry) - Microalbumin / creatinine urine ratio - Hemoglobin A1c   3. DOE (dyspnea on exertion) Likely due to deconditioning. Dr. Gwenlyn Found does not think it is a cardiac issue.  She has seen Dr. Melvyn Novas in the past for the same  4. Essential hypertension At goal.  Continue current medications and low-salt diet  5. Pituitary adenoma [facility]) Seen by the neurosurgeon at [facility].  He recommends repeat MRI in 5 years.  Apparently he was able to see the report of the MRI of the brain that we did back in July that indicated no interval enlargement of the 13 mm pituitary adenoma  6. Hypothyroidism, unspecified type Continue levothyroxine - TSH  7. Encounter for screening mammogram for malignant neoplasm of breast - MM Digital Screening; Future  8. History of bladder surgery - Ambulatory referral to Gynecology  9. Primary osteoarthritis of both hips We will get some updated x-rays of her hips and refer to orthopedics.  Weight loss will help She is on narcotic medication through a pain management program - DG Hip Unilat W OR W/O Pelvis Min 4 Views Right; Future - DG Hip Unilat W OR W/O Pelvis Min 4 Views Left; Future - Ambulatory referral to  Orthopedic Surgery  10. Chronic right shoulder pain - Ambulatory referral to Orthopedic Surgery .   Patient was given the opportunity to ask questions.  Patient verbalized understanding of the plan and was able to repeat key elements of the plan.   Orders Placed This Encounter  Procedures  . DG Hip Unilat W OR W/O Pelvis Min 4 Views  Right  . DG Hip Unilat W OR W/O Pelvis Min 4 Views Left  . MM Digital Screening  . Microalbumin / creatinine urine ratio  . TSH  . Hemoglobin A1c  . Ambulatory referral to Gynecology  . Ambulatory referral to Orthopedic Surgery  . POCT glucose (manual entry)     Requested Prescriptions    No prescriptions requested or ordered in this encounter    Return in about 3 months (around 02/10/2020).  Karle Plumber, MD, FACP

## 2019-11-13 NOTE — Progress Notes (Signed)
Pt states her pain is coming from her whole body

## 2019-11-14 ENCOUNTER — Telehealth: Payer: Self-pay

## 2019-11-14 LAB — MICROALBUMIN / CREATININE URINE RATIO
Creatinine, Urine: 142.9 mg/dL
Microalb/Creat Ratio: 7 mg/g creat (ref 0–29)
Microalbumin, Urine: 9.5 ug/mL

## 2019-11-14 LAB — HEMOGLOBIN A1C
Est. average glucose Bld gHb Est-mCnc: 140 mg/dL
Hgb A1c MFr Bld: 6.5 % — ABNORMAL HIGH (ref 4.8–5.6)

## 2019-11-14 LAB — TSH: TSH: 2.39 u[IU]/mL (ref 0.450–4.500)

## 2019-11-14 NOTE — Telephone Encounter (Signed)
Contacted pt to go over lab results pt is aware and doesn't have any questions or concerns 

## 2019-11-15 ENCOUNTER — Other Ambulatory Visit: Payer: Self-pay

## 2019-11-15 ENCOUNTER — Ambulatory Visit: Payer: Medicaid Other | Admitting: Physical Therapy

## 2019-11-15 ENCOUNTER — Encounter: Payer: Self-pay | Admitting: Physical Therapy

## 2019-11-15 DIAGNOSIS — R2681 Unsteadiness on feet: Secondary | ICD-10-CM | POA: Diagnosis not present

## 2019-11-15 DIAGNOSIS — M6281 Muscle weakness (generalized): Secondary | ICD-10-CM | POA: Diagnosis not present

## 2019-11-15 DIAGNOSIS — R262 Difficulty in walking, not elsewhere classified: Secondary | ICD-10-CM

## 2019-11-15 NOTE — Patient Instructions (Signed)
Access Code: [email or web address]  URL: [email or web address]  Date: 11/15/2019  Prepared by: Willow Ora   Exercises Seated Heel Toe Raises - 10 reps - 1 sets - 1x daily - 5x weekly Seated Long Arc Quad - 10 reps - 1 sets - 1x daily - 5x weekly Seated Hamstring Curl with Anchored Resistance - 10 reps - 1 sets - 1x daily - 5x weekly Seated March - 10 reps - 1 sets - 1x daily - 5x weekly Seated Hip Abduction with Resistance - 10 reps - 1 sets - 1x daily - 5x weekly Seated Hamstring Stretch - 3 reps - 1 sets - 30 hold - 1x daily - 5x weekly  Added this ex today: Seated Hip Adduction Squeeze with Ball - 10 reps - 1 sets - 5 hold - 1x daily - 5x weekly

## 2019-11-16 DIAGNOSIS — F331 Major depressive disorder, recurrent, moderate: Secondary | ICD-10-CM | POA: Diagnosis not present

## 2019-11-16 NOTE — Therapy (Signed)
[facility] [address] [place], [place], [address] Phone: [phone]   Fax:  [phone]  Physical Therapy Treatment  Patient Details  Name: Robin [name] MRN: [id number] Date of Birth: August 29, 1956 Referring Provider (PT): Karle Plumber, MD   Encounter Date: 11/15/2019  PT End of Session - 11/15/19 0951    Visit Number  4    Number of Visits  12    Date for PT Re-Evaluation  01/08/20    Authorization Type  Medicaid    Authorization Time Period  1/22-2/11/21- 3 visits    Authorization - Visit Number  3    Authorization - Number of Visits  3    PT Start Time  0949   pt late for appt   PT Stop Time  1015    PT Time Calculation (min)  26 min    Equipment Utilized During Treatment  Gait belt;Other (comment)   Utlized larger size Blue gait belt; bariatric RW   Activity Tolerance  Patient limited by pain    Behavior During Therapy  [facility] for tasks assessed/performed       Past Medical History:  Diagnosis Date  . Anxiety disorder   . Arthritis   . Asthma   . Chronic neck pain   . Constipation   . Depression   . Diabetes mellitus without complication ([place])   . Dyspnea   . GERD (gastroesophageal reflux disease)   . Hiatal hernia    small  . Hyperlipidemia   . Hypertension   . Hypothyroidism   . Schatzki's ring    non critical  . Sleep apnea    CPAP, Sleep study at [facility]  . Sleep apnea     Past Surgical History:  Procedure Laterality Date  . ABDOMINAL HYSTERECTOMY    . ABDOMINAL HYSTERECTOMY  02/18/2000  . CARDIAC CATHETERIZATION  08/18/2003   normal L main/LAD/L Cfx/RCA (Dr. Adora Fridge)  . COLONOSCOPY  2006   Dr. [name], hyperplastic polyps  . COLONOSCOPY  08/06/11   abnormal terminal ileum for 10cm, erosions, geographical ulceration. Bx small bowel mucosa with prominent intramucosal lymphoid aggregates, slightly inflammed  . DIRECT LARYNGOSCOPY  03/15/2012   Procedure: DIRECT  LARYNGOSCOPY;  Surgeon: [name], MD;  Location: [facility];  Service: ENT;  Laterality: N/A; Dr. Wolicki--:>no foreign body seen. normal esophagus to 40cm  . ESOPHAGEAL DILATION  04/17/2015   Procedure: ESOPHAGEAL DILATION;  Surgeon: [name], MD;  [facility];  Service: Endoscopy;;  . ESOPHAGOGASTRODUODENOSCOPY  08/23/2008   QIH:[id number] distal esophageal erosions consistent with mild erosive reflux esophagitis, otherwise unremarkable esophagus/ Tiny antral erosions of doubtful clinical significance, otherwise normal stomach, patent pylorus, normal D1 and D2  . ESOPHAGOGASTRODUODENOSCOPY  08/06/11   small hh, noncritical Schatzki's ring s/p 2 F  . ESOPHAGOGASTRODUODENOSCOPY N/A 04/17/2015   [id number]:GLOVFI s/p dilation  . ESOPHAGOGASTRODUODENOSCOPY (EGD) WITH PROPOFOL N/A 01/20/2018   Dr. [name]: Erosive gastropathy, normal-appearing esophagus status post empiric dilation.  Chronic gastritis, no H. pylori.  . ESOPHAGOSCOPY  03/15/2012   Procedure: ESOPHAGOSCOPY;  Surgeon: [name], MD;  Location: [facility];  Service: ENT;  Laterality: N/A;  . KNEE ARTHROSCOPY  04/27/2001  . MALONEY DILATION N/A 01/20/2018   Procedure: Venia Minks DILATION;  Surgeon: [name], MD;  [facility];  Service: Endoscopy;  Laterality: N/A;  . NM MYOCAR PERF WALL MOTION  2003   negative bruce protocol exercise tress test; EF 68%; intermediate risk  study due to evidence of anterior wall ischemia extending from mid-ventricle to apex  . PITUITARY SURGERY  06/2012   benign tumor, surgeon at [facility]  . RECTOCELE REPAIR    . TRANSTHORACIC ECHOCARDIOGRAM  2003   EF normal  . VAGINAL PROLAPSE REPAIR    . VIDEO BRONCHOSCOPY Bilateral 03/08/2017   Procedure: VIDEO BRONCHOSCOPY WITHOUT FLUORO;  Surgeon: Javier Glazier, MD;  Location: [facility];  Service: Cardiopulmonary;  Laterality: Bilateral;    There were no vitals filed for this visit.  Subjective Assessment - 11/15/19 0950    Subjective  No  new complaints. No falls.    Patient is accompained by:  Family member   spouse in lobby   Pertinent History  PMH: HTN, pre-DM, OSA, fibromyalgia, depression, obesity,  GERD, hypothyroid, OA of multiple js (hands, knees, shoulders)MRI pituitary adenoma: stable; pinched nerve in low back    How long can you stand comfortably?  5 min    How long can you walk comfortably?  Walks if she needs to; always having pain    Currently in Pain?  No/denies            [facility] Adult PT Treatment/Exercise - 11/15/19 0956      Transfers   Transfers  Sit to Stand;Stand to Sit;Stand Pivot Transfers    Sit to Stand  4: Min guard;With upper extremity assist;From bed;From chair/3-in-1    Stand to Sit  4: Min guard;With upper extremity assist;To bed;To chair/3-in-1    Stand Pivot Transfers  4: Min guard    Stand Pivot Transfer Details (indicate cue type and reason)  with wide RW power chair to mat table      Ambulation/Gait   Ambulation/Gait  Yes    Ambulation/Gait Assistance  4: Min guard;4: Min assist    Ambulation/Gait Assistance Details  cues needed for posture, step length and walker position. no knee buckling noted with gait.     Ambulation Distance (Feet)  20 Feet    Assistive device  Rolling walker    Ambulation Surface  Level;Indoor      Exercises   Exercises  Other Exercises    Other Exercises   seated at edge of mat table: bil LE's- heel/toe raises for 10 reps; long arc quads for 10 reps each side; pillow squeeze with glut squeeze for 5 sec holds x 10 reps.            PT Short Term Goals - 11/15/19 5102      PT SHORT TERM GOAL #1   Title  Pt will be independent with HEP to address strength, flexibility, balance, and gait progression.  TARGET 11/03/2019    Baseline  11/15/19: met with curent ex program    Status  Achieved    Target Date  11/03/19      PT SHORT TERM GOAL #2   Title  Pt will be ambulate at least 15 ft using RW (vs cane), min assist for improved gait within home.     Baseline  11/15/19: 20 feet with wide RW min gaurd to min assist    Time  --    Period  --    Status  Achieved    Target Date  11/03/19      PT SHORT TERM GOAL #3   Title  Pt will perform sit<>stand with minimal assistance for improved transfer efficiency and safety.    Baseline  11/15/19: met in session today    Time  --  Period  --    Status  Achieved    Target Date  11/03/19        PT Long Term Goals - 11/15/19 1700      PT LONG TERM GOAL #1   Title  Pt will be independent with progression of HEP for improved strength, flexibility, balance, and gait.  TARGET 12/04/2019    Baseline  11/15/19: current HEP will need to be updated as pt progresses    Time  7    Period  Weeks    Status  On-going      PT LONG TERM GOAL #2   Title  Pt will ambulate at least 40 ft using RW with min guard assistance, for improved gait efficiency and safety in the home.    Baseline  11/15/19: 20 feet with RW min guard to min assist    Time  7    Period  Weeks    Status  On-going      PT LONG TERM GOAL #3   Title  Pt will improve sit<>stand to supervision level, for improved transfer efficiency and safety.    Baseline  11/15/19: min guard to min assist for sit<>stands    Time  7    Period  Weeks    Status  On-going      PT LONG TERM GOAL #4   Title  Pt will perform at least 10 minutes of standing activities with UE support, for improved participation in ADLs and household tasks.    Baseline  Pt reports standing no more than 5 minutes at a time, limited due to pain    Time  7    Period  Weeks    Status  On-going      PT LONG TERM GOAL #5   Title  Pt will negotiate at least 3 steps with cane and HHA for improved negotiation of stairs into and out of home.    Baseline  Uses cane, banister and assist of family member at home    Time  7    Period  Weeks    Status  On-going            Plan - 11/15/19 2703    Clinical Impression Statement  Today's skilled session focused on progress toward  STGs with goals met. Pt with increased gait distance with less overall assistance needed today. The pt is progressing well toward goals and should benefit from continued PT to progress toward unmet goals.    Personal Factors and Comorbidities  Comorbidity 3+    Comorbidities  HTN, pre-DM, OSA, fibromyalgia, depression, obesity,  GERD, hypothyroid, OA of multiple js (hands, knees, shoulders)MRI pituitary adenoma: stable    Examination-Activity Limitations  Stand;Stairs;Locomotion Level;Transfers    Examination-Participation Restrictions  Community Activity;Meal Prep;Other   Standing ADLs   Stability/Clinical Decision Making  Evolving/Moderate complexity    Rehab Potential  Good    PT Frequency  Other (comment)   1x/wk for 3 weeks; then 2x/wk for 4 weeks   PT Duration  Other (comment)   7 weeks total POC   PT Treatment/Interventions  ADLs/Self Care Home Management;Aquatic Therapy;Gait training;Stair training;Functional mobility training;Therapeutic activities;Therapeutic exercise;Balance training;Neuromuscular re-education;Patient/family education    PT Next Visit Plan  standing tolerance/balance/gait and stairs as able    PT Home Exercise Plan  Access Code: [email or web address]    Consulted and Agree with Plan of Care  Patient       Patient will benefit from skilled therapeutic intervention in order  to improve the following deficits and impairments:  Abnormal gait, Decreased range of motion, Difficulty walking, Decreased endurance, Decreased activity tolerance, Pain, Decreased balance, Impaired flexibility, Decreased mobility, Decreased strength  Visit Diagnosis: Muscle weakness (generalized)  Unsteadiness on feet  Difficulty in walking, not elsewhere classified     Problem List Patient Active Problem List   Diagnosis Date Noted  . Multilevel degenerative disc disease 10/31/2018  . Abdominal pain 10/28/2018  . Change in bowel function 10/28/2018  . Gastritis and gastroduodenitis 04/26/2018   . Atrophic vaginitis 04/01/2018  . Diabetes mellitus ([place]) 03/31/2018  . Upper airway cough syndrome 02/08/2018  . Dysphagia 12/14/2017  . 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 Urinary incontinence 07/23/2017  . Early satiety 06/16/2017  . Hx of iron deficiency anemia 05/28/2017  . Throat pain in adult 04/05/2017  . Prediabetes 02/04/2017  . Dyspnea on exertion 12/23/2016  . Chronic allergic rhinitis 12/23/2016  . Hemoptysis 12/23/2016  . Fibromyalgia 08/18/2016  . Chronic lymphocytic thyroiditis 08/18/2016  . Depression 04/01/2016  . OSA (obstructive sleep apnea) 04/01/2016  . Essential hypertension 12/09/2015  . Hypothyroidism 12/09/2015  . Morbid obesity due to excess calories ([place]) 12/09/2015  . Constipation 05/27/2015  . Fatty liver 05/27/2015  . Abdominal pain, chronic, epigastric 07/04/2012  . Anxiety 06/09/2012  . History of gastroesophageal reflux (GERD) 06/09/2012  . Hyperlipidemia 06/09/2012  . Refusal of blood transfusions as patient is Jehovah's Witness 06/09/2012  . 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 Abnormal small bowel biopsy 09/18/2011  . Lactose intolerance 09/18/2011  . GERD 01/21/2010    Willow Ora, PTA, [facility] [address], [place], [place] [address] [phone] 11/16/19, 9:15 AM   Name: Robin [name] MRN: [id number] Date of Birth: [birth year]

## 2019-11-17 ENCOUNTER — Other Ambulatory Visit: Payer: Self-pay | Admitting: Internal Medicine

## 2019-11-17 ENCOUNTER — Other Ambulatory Visit: Payer: Self-pay | Admitting: Cardiovascular Disease

## 2019-11-17 MED FILL — DEXILANT DR 60 MG CAPSULE: 60 | 30 days supply | Qty: 30 | Fill #5

## 2019-11-17 MED FILL — METOPROLOL TARTRATE 75 MG T: 75 | 30 days supply | Qty: 60 | Fill #0

## 2019-11-17 MED FILL — GABAPENTIN 300 MG CAPSULE: 300 | 30 days supply | Qty: 120 | Fill #2

## 2019-11-17 MED FILL — LINZESS 290 MCG CAPSULE: 290 | 30 days supply | Qty: 30 | Fill #1

## 2019-11-20 ENCOUNTER — Other Ambulatory Visit: Payer: Self-pay | Admitting: Internal Medicine

## 2019-11-20 DIAGNOSIS — F341 Dysthymic disorder: Secondary | ICD-10-CM

## 2019-11-20 DIAGNOSIS — J309 Allergic rhinitis, unspecified: Secondary | ICD-10-CM

## 2019-11-20 LAB — HM DIABETES EYE EXAM

## 2019-11-20 MED FILL — SPIRONOLACTONE 25 MG TABLET: 25 | 30 days supply | Qty: 30 | Fill #0

## 2019-11-20 MED FILL — BUPROPION HCL ER (XL) 300 M: 300 | 30 days supply | Qty: 30 | Fill #0

## 2019-11-20 MED FILL — MONTELUKAST SOD 10 MG TAB: 10 | 30 days supply | Qty: 30 | Fill #0

## 2019-11-20 MED FILL — DULoxetine HCL 30 MG CPEP: 30 | 30 days supply | Qty: 30 | Fill #0

## 2019-11-21 ENCOUNTER — Ambulatory Visit (INDEPENDENT_AMBULATORY_CARE_PROVIDER_SITE_OTHER): Payer: Medicaid Other | Admitting: Orthopaedic Surgery

## 2019-11-21 ENCOUNTER — Other Ambulatory Visit: Payer: Self-pay

## 2019-11-21 ENCOUNTER — Ambulatory Visit (INDEPENDENT_AMBULATORY_CARE_PROVIDER_SITE_OTHER): Payer: Medicaid Other

## 2019-11-21 ENCOUNTER — Encounter: Payer: Self-pay | Admitting: Orthopaedic Surgery

## 2019-11-21 ENCOUNTER — Ambulatory Visit: Payer: Self-pay

## 2019-11-21 VITALS — Ht 68.0 in | Wt 346.0 lb

## 2019-11-21 DIAGNOSIS — M1712 Unilateral primary osteoarthritis, left knee: Secondary | ICD-10-CM | POA: Diagnosis not present

## 2019-11-21 DIAGNOSIS — M1612 Unilateral primary osteoarthritis, left hip: Secondary | ICD-10-CM | POA: Diagnosis not present

## 2019-11-21 NOTE — Progress Notes (Signed)
Subjective: Patient is here for ultrasound-guided intra-articular left hip injection.   Pain from DJD.  Objective:  Pain with passive IR.  Procedure: Ultrasound-guided left hip injection: After sterile prep with Betadine, injected 8 cc 1% lidocaine without epinephrine and 40 mg methylprednisolone using a 22-gauge spinal needle, passing the needle through the iliofemoral ligament into the femoral head/neck junction.  Injectate seen filling joint capsule.  Good immediate relief.

## 2019-11-21 NOTE — Progress Notes (Signed)
Office Visit Note   Patient: Robin [name]           Date of Birth: 1956/09/21           MRN: [id number] Visit Date: 11/21/2019              Requested by: Ladell Pier, MD [address] [place],  [place] [address] PCP: Ladell Pier, MD   Assessment & Plan: Visit Diagnoses:  1. Primary osteoarthritis of left knee   2. Primary osteoarthritis of left hip     Plan: Impression is advanced degenerative joint disease left hip and left knee.  We have discussed definitive treatment of left total hip and left total knee replacements.  She currently has a BMI of 52.61.  We have discussed the need for the patient to lose 86 pounds to get to 260 pounds which will give her a BMI of under 40.  She is currently seeing a bariatric specialist and she will make all efforts at weight loss.  In the meantime, we will refer her to Dr. Junius Roads for an ultrasound-guided left hip joint injection.  Follow-up with [name] for left knee injection within the next few weeks.  Call with concerns or questions in meantime.  Follow-Up Instructions: Return if symptoms worsen or fail to improve.   Orders:  Orders Placed This Encounter  Procedures  . XR HIP UNILAT W OR W/O PELVIS 2-3 VIEWS LEFT  . XR KNEE 3 VIEW LEFT  . US Guided Needle Placement - No Linked Charges   No orders of the defined types were placed in this encounter.     Procedures: No procedures performed   Clinical Data: No additional findings.   Subjective: Chief Complaint  Patient presents with  . Right Hip - Pain  . Left Hip - Pain  . Right Shoulder - Pain    HPI patient is a pleasant 64 year old female who comes in today with left knee and left hip pain.  Both have been ongoing for the past several years.  In regards to her knee the pain is to the entire aspect worse with standing and walking.  She has been taking gabapentin and oxycodone provided by pain management without significant relief of symptoms.  She does note previous  cortisone injection to both joint which was over a year ago.  These have provided significant relief of symptoms in the past.  She is currently ambulating in a motorized wheelchair as her pain is because of balance issues.  She initially started out using a cane and then a walker and now has progressed to this current wheelchair.  In regards to her left hip, the pain she has is to the groin and anterior thigh.  Worse with ambulation.  Review of Systems as detailed in HPI.  All others reviewed and are negative.   Objective: Vital Signs: Ht 5\' 8"  (1.727 m)   Wt (!) 346 lb (156.9 kg)   BMI 52.61 kg/m   Physical Exam well-developed well-nourished female no acute distress.  Alert and oriented x3.  Ortho Exam examination of her left hip reveals markedly positive logroll with hardly any rotation.  Left knee exam shows a trace effusion.  Range of motion from about 15 degrees of flexion contracture to 90 degrees of flexion.  Medial and lateral joint line tenderness.  Moderate patellofemoral crepitus.  She is neurovascular intact distally.  Specialty Comments:  No specialty comments available.  Imaging: US Guided Needle Placement - No Linked  Charges  Result Date: 11/21/2019 Please see Notes tab for imaging impression.  XR HIP UNILAT W OR W/O PELVIS 2-3 VIEWS LEFT  Result Date: 11/21/2019 Significant degenerative changes to the left hip joint  XR KNEE 3 VIEW LEFT  Result Date: 11/21/2019 Significant tricompartmental degenerative changes    PMFS History: Patient Active Problem List   Diagnosis Date Noted  . Multilevel degenerative disc disease 10/31/2018  . Abdominal pain 10/28/2018  . Change in bowel function 10/28/2018  . Gastritis and gastroduodenitis 04/26/2018  . Atrophic vaginitis 04/01/2018  . Diabetes mellitus ([place]) 03/31/2018  . Upper airway cough syndrome 02/08/2018  . Dysphagia 12/14/2017  . 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 Urinary incontinence 07/23/2017  . Early satiety  06/16/2017  . Hx of iron deficiency anemia 05/28/2017  . Throat pain in adult 04/05/2017  . Prediabetes 02/04/2017  . Dyspnea on exertion 12/23/2016  . Chronic allergic rhinitis 12/23/2016  . Hemoptysis 12/23/2016  . Fibromyalgia 08/18/2016  . Chronic lymphocytic thyroiditis 08/18/2016  . Depression 04/01/2016  . OSA (obstructive sleep apnea) 04/01/2016  . Essential hypertension 12/09/2015  . Hypothyroidism 12/09/2015  . Morbid obesity due to excess calories ([place]) 12/09/2015  . Constipation 05/27/2015  . Fatty liver 05/27/2015  . Abdominal pain, chronic, epigastric 07/04/2012  . Anxiety 06/09/2012  . History of gastroesophageal reflux (GERD) 06/09/2012  . Hyperlipidemia 06/09/2012  . Refusal of blood transfusions as patient is Jehovah's Witness 06/09/2012  . 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 Abnormal small bowel biopsy 09/18/2011  . Lactose intolerance 09/18/2011  . GERD 01/21/2010   Past Medical History:  Diagnosis Date  . Anxiety disorder   . Arthritis   . Asthma   . Chronic neck pain   . Constipation   . Depression   . Diabetes mellitus without complication ([place])   . Dyspnea   . GERD (gastroesophageal reflux disease)   . Hiatal hernia    small  . Hyperlipidemia   . Hypertension   . Hypothyroidism   . Schatzki's ring    non critical  . Sleep apnea    CPAP, Sleep study at [facility]  . Sleep apnea     Family History  Problem Relation Age of Onset  . Kidney cancer Father   . Hypertension Father   . Hyperlipidemia Father   . Sleep apnea Father   . Diabetes Mother   . Hypertension Mother   . Heart Problems Mother   . Hyperlipidemia Mother   . Asthma Mother   . Sleep apnea Mother   . Liver disease Brother   . Arthritis Brother   . Hypertension Sister   . Allergic rhinitis Sister   . Stomach cancer Other        aunt  . Breast cancer Maternal Grandmother   . Kidney disease Maternal Grandfather   . Breast cancer Paternal Grandmother    . Hypertension Brother   . Hyperlipidemia Brother   . Hypertension Brother   . Hypertension Sister   . Hyperlipidemia Sister   . Lupus Maternal Aunt   . Colon cancer Neg Hx   . Eczema Neg Hx   . Immunodeficiency Neg Hx   . Urticaria Neg Hx     Past Surgical History:  Procedure Laterality Date  . ABDOMINAL HYSTERECTOMY    . ABDOMINAL HYSTERECTOMY  02/18/2000  . CARDIAC CATHETERIZATION  08/18/2003   normal L main/LAD/L Cfx/RCA (Dr. Adora Fridge)  . COLONOSCOPY  2006   Dr. [name], hyperplastic polyps  .  COLONOSCOPY  08/06/11   abnormal terminal ileum for 10cm, erosions, geographical ulceration. Bx small bowel mucosa with prominent intramucosal lymphoid aggregates, slightly inflammed  . DIRECT LARYNGOSCOPY  03/15/2012   Procedure: DIRECT LARYNGOSCOPY;  Surgeon: [name], MD;  Location: [facility];  Service: ENT;  Laterality: N/A; Dr. Wolicki--:>no foreign body seen. normal esophagus to 40cm  . ESOPHAGEAL DILATION  04/17/2015   Procedure: ESOPHAGEAL DILATION;  Surgeon: [name], MD;  [facility];  Service: Endoscopy;;  . ESOPHAGOGASTRODUODENOSCOPY  08/23/2008   [id number] distal esophageal erosions consistent with mild erosive reflux esophagitis, otherwise unremarkable esophagus/ Tiny antral erosions of doubtful clinical significance, otherwise normal stomach, patent pylorus, normal D1 and D2  . ESOPHAGOGASTRODUODENOSCOPY  08/06/11   small hh, noncritical Schatzki's ring s/p 77 F  . ESOPHAGOGASTRODUODENOSCOPY N/A 04/17/2015   [id number] s/p dilation  . ESOPHAGOGASTRODUODENOSCOPY (EGD) WITH PROPOFOL N/A 01/20/2018   Dr. [name]: Erosive gastropathy, normal-appearing esophagus status post empiric dilation.  Chronic gastritis, no H. pylori.  . ESOPHAGOSCOPY  03/15/2012   Procedure: ESOPHAGOSCOPY;  Surgeon: [name], MD;  Location: [facility];  Service: ENT;  Laterality: N/A;  . KNEE ARTHROSCOPY  04/27/2001  . MALONEY DILATION N/A 01/20/2018   Procedure: Venia Minks DILATION;   Surgeon: [name], MD;  [facility];  Service: Endoscopy;  Laterality: N/A;  . [place]  2003   negative bruce protocol exercise tress test; EF 68%; intermediate risk study due to evidence of anterior wall ischemia extending from mid-ventricle to apex  . PITUITARY SURGERY  06/2012   benign tumor, surgeon [facility]  . RECTOCELE REPAIR    . TRANSTHORACIC ECHOCARDIOGRAM  2003   EF normal  . VAGINAL PROLAPSE REPAIR    . VIDEO BRONCHOSCOPY Bilateral 03/08/2017   Procedure: VIDEO BRONCHOSCOPY WITHOUT FLUORO;  Surgeon: Javier Glazier, MD;  Location: [facility];  Service: Cardiopulmonary;  Laterality: Bilateral;   Social History   Occupational History    Employer: UNEMPLOYED  Tobacco Use  . Smoking status: Former Smoker    Packs/day: 0.50    Years: 10.00    Pack years: 5.00    Types: Cigarettes    Start date: 07/14/1975    Quit date: 04/04/1993    Years since quitting: 26.6  . Smokeless tobacco: Never Used  . Tobacco comment: 17 yrs ago  Substance and Sexual Activity  . Alcohol use: No    Alcohol/week: 0.0 standard drinks  . Drug use: No  . Sexual activity: Not on file

## 2019-11-22 ENCOUNTER — Other Ambulatory Visit: Payer: Self-pay | Admitting: Internal Medicine

## 2019-11-22 ENCOUNTER — Encounter (HOSPITAL_COMMUNITY): Payer: Self-pay

## 2019-11-22 ENCOUNTER — Other Ambulatory Visit: Payer: Self-pay

## 2019-11-22 ENCOUNTER — Other Ambulatory Visit (HOSPITAL_COMMUNITY): Payer: Medicaid Other

## 2019-11-22 ENCOUNTER — Ambulatory Visit (HOSPITAL_COMMUNITY)
Admission: EM | Admit: 2019-11-22 | Discharge: 2019-11-22 | Disposition: A | Discharge status: Home / Self Care | Payer: Medicaid Other | Attending: Family Medicine | Admitting: Family Medicine

## 2019-11-22 DIAGNOSIS — J45909 Unspecified asthma, uncomplicated: Secondary | ICD-10-CM | POA: Diagnosis not present

## 2019-11-22 DIAGNOSIS — Z20822 Contact with and (suspected) exposure to covid-19: Secondary | ICD-10-CM | POA: Diagnosis not present

## 2019-11-22 DIAGNOSIS — J069 Acute upper respiratory infection, unspecified: Secondary | ICD-10-CM | POA: Insufficient documentation

## 2019-11-22 DIAGNOSIS — B9789 Other viral agents as the cause of diseases classified elsewhere: Secondary | ICD-10-CM

## 2019-11-22 DIAGNOSIS — E785 Hyperlipidemia, unspecified: Secondary | ICD-10-CM | POA: Diagnosis not present

## 2019-11-22 DIAGNOSIS — Z833 Family history of diabetes mellitus: Secondary | ICD-10-CM | POA: Diagnosis not present

## 2019-11-22 DIAGNOSIS — Z87891 Personal history of nicotine dependence: Secondary | ICD-10-CM | POA: Diagnosis not present

## 2019-11-22 DIAGNOSIS — J3489 Other specified disorders of nose and nasal sinuses: Secondary | ICD-10-CM

## 2019-11-22 DIAGNOSIS — Z7984 Long term (current) use of oral hypoglycemic drugs: Secondary | ICD-10-CM | POA: Insufficient documentation

## 2019-11-22 DIAGNOSIS — R519 Headache, unspecified: Secondary | ICD-10-CM | POA: Insufficient documentation

## 2019-11-22 DIAGNOSIS — Z8249 Family history of ischemic heart disease and other diseases of the circulatory system: Secondary | ICD-10-CM | POA: Insufficient documentation

## 2019-11-22 DIAGNOSIS — J0120 Acute ethmoidal sinusitis, unspecified: Secondary | ICD-10-CM | POA: Insufficient documentation

## 2019-11-22 DIAGNOSIS — E039 Hypothyroidism, unspecified: Secondary | ICD-10-CM | POA: Insufficient documentation

## 2019-11-22 DIAGNOSIS — Z7982 Long term (current) use of aspirin: Secondary | ICD-10-CM | POA: Insufficient documentation

## 2019-11-22 DIAGNOSIS — Z79899 Other long term (current) drug therapy: Secondary | ICD-10-CM | POA: Insufficient documentation

## 2019-11-22 DIAGNOSIS — I10 Essential (primary) hypertension: Secondary | ICD-10-CM | POA: Insufficient documentation

## 2019-11-22 DIAGNOSIS — R05 Cough: Secondary | ICD-10-CM | POA: Diagnosis not present

## 2019-11-22 DIAGNOSIS — R0981 Nasal congestion: Secondary | ICD-10-CM | POA: Diagnosis present

## 2019-11-22 DIAGNOSIS — G4733 Obstructive sleep apnea (adult) (pediatric): Secondary | ICD-10-CM | POA: Insufficient documentation

## 2019-11-22 DIAGNOSIS — E119 Type 2 diabetes mellitus without complications: Secondary | ICD-10-CM | POA: Diagnosis not present

## 2019-11-22 DIAGNOSIS — Z79890 Hormone replacement therapy (postmenopausal): Secondary | ICD-10-CM | POA: Insufficient documentation

## 2019-11-22 MED ORDER — FLUTICASONE PROPIONATE 50 MCG/ACT NA SUSP: 1.0000 | Freq: Every day | NASAL | 1 refills | Status: DC

## 2019-11-22 MED ORDER — BENZONATATE 100 MG PO CAPS: 100.0000 mg | ORAL_CAPSULE | Freq: Three times a day (TID) | ORAL | 0 refills | Status: DC

## 2019-11-22 MED ORDER — ALBUTEROL SULFATE HFA 108 (90 BASE) MCG/ACT IN AERS: 1.0000 | INHALATION_SPRAY | Freq: Four times a day (QID) | RESPIRATORY_TRACT | 0 refills | Status: DC | PRN

## 2019-11-22 MED ORDER — CETIRIZINE HCL 10 MG PO TABS: 10.0000 mg | ORAL_TABLET | Freq: Every day | ORAL | 1 refills | Status: DC

## 2019-11-22 MED FILL — CELECOXIB 100 MG CAP: 100 | 30 days supply | Qty: 60 | Fill #0

## 2019-11-22 MED FILL — CETIRIZINE HCL 10 MG TABS: 10 | 30 days supply | Qty: 30 | Fill #0

## 2019-11-22 MED FILL — FLUTICASONE PROP 50 MCG SPR: 50 | 30 days supply | Qty: 16 | Fill #0

## 2019-11-22 MED FILL — AMITRIPTYLINE HCL 50 MG TAB: 50 | 30 days supply | Qty: 30 | Fill #0

## 2019-11-22 MED FILL — ALBUTEROL SULFATE HFA 108 (: 108 (90 BAS | 25 days supply | Qty: 18 | Fill #0

## 2019-11-22 NOTE — Discharge Instructions (Addendum)
Take the medications as prescribed Follow up as needed for continued or worsening symptoms We will call if your covid swab is positive

## 2019-11-22 NOTE — ED Provider Notes (Signed)
[place]    CSN: [id number] Arrival date & time: 11/22/19  1146      History   Chief Complaint Chief Complaint  Patient presents with  . Nasal Congestion    HPI Robin [name] is a 64 y.o. female.   Patient is a 64 year old female with past medical history of anxiety, arthritis, asthma, chronic pain, constipation, depression, diabetes, GERD, hiatal hernia, hypertension, hyperlipidemia, Schatzki's ring, sleep apnea on CPAP.  She presents today with approximate 2 days of nasal congestion, rhinorrhea, headache, lateral ear congestion and cough.  She has been using Tylenol for headache with relief along with albuterol for cough, wheezing or shortness of breath.  No fever, chills, body aches or night sweats.  No recent sick contacts or recent traveling.  ROS per HPI        Past Medical History:  Diagnosis Date  . Anxiety disorder   . Arthritis   . Asthma   . Chronic neck pain   . Constipation   . Depression   . Diabetes mellitus without complication ([place])   . Dyspnea   . GERD (gastroesophageal reflux disease)   . Hiatal hernia    small  . Hyperlipidemia   . Hypertension   . Hypothyroidism   . Schatzki's ring    non critical  . Sleep apnea    CPAP, Sleep study at [facility]  . Sleep apnea     Patient Active Problem List   Diagnosis Date Noted  . Multilevel degenerative disc disease 10/31/2018  . Abdominal pain 10/28/2018  . Change in bowel function 10/28/2018  . Gastritis and gastroduodenitis 04/26/2018  . Atrophic vaginitis 04/01/2018  . Diabetes mellitus ([place]) 03/31/2018  . Upper airway cough syndrome 02/08/2018  . Dysphagia 12/14/2017  . 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 Urinary incontinence 07/23/2017  . Early satiety 06/16/2017  . Hx of iron deficiency anemia 05/28/2017  . Throat pain in adult 04/05/2017  . Prediabetes 02/04/2017  . Dyspnea on exertion 12/23/2016  . Chronic allergic rhinitis 12/23/2016  . Hemoptysis 12/23/2016   . Fibromyalgia 08/18/2016  . Chronic lymphocytic thyroiditis 08/18/2016  . Depression 04/01/2016  . OSA (obstructive sleep apnea) 04/01/2016  . Essential hypertension 12/09/2015  . Hypothyroidism 12/09/2015  . Morbid obesity due to excess calories ([place]) 12/09/2015  . Constipation 05/27/2015  . Fatty liver 05/27/2015  . Abdominal pain, chronic, epigastric 07/04/2012  . Anxiety 06/09/2012  . History of gastroesophageal reflux (GERD) 06/09/2012  . Hyperlipidemia 06/09/2012  . Refusal of blood transfusions as patient is Jehovah's Witness 06/09/2012  . 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 Abnormal small bowel biopsy 09/18/2011  . Lactose intolerance 09/18/2011  . GERD 01/21/2010    Past Surgical History:  Procedure Laterality Date  . ABDOMINAL HYSTERECTOMY    . ABDOMINAL HYSTERECTOMY  02/18/2000  . CARDIAC CATHETERIZATION  08/18/2003   normal L main/LAD/L Cfx/RCA (Dr. Adora Fridge)  . COLONOSCOPY  2006   Dr. [name], hyperplastic polyps  . COLONOSCOPY  08/06/11   abnormal terminal ileum for 10cm, erosions, geographical ulceration. Bx small bowel mucosa with prominent intramucosal lymphoid aggregates, slightly inflammed  . DIRECT LARYNGOSCOPY  03/15/2012   Procedure: DIRECT LARYNGOSCOPY;  Surgeon: [name], MD;  Location: [facility];  Service: ENT;  Laterality: N/A; Dr. Wolicki--:>no foreign body seen. normal esophagus to 40cm  . ESOPHAGEAL DILATION  04/17/2015   Procedure: ESOPHAGEAL DILATION;  Surgeon: [name], MD;  [facility];  Service: Endoscopy;;  . ESOPHAGOGASTRODUODENOSCOPY  08/23/2008   [id number] distal esophageal erosions consistent with mild erosive reflux esophagitis, otherwise unremarkable esophagus/ Tiny antral erosions of doubtful clinical significance, otherwise normal stomach, patent pylorus, normal D1 and D2  . ESOPHAGOGASTRODUODENOSCOPY  08/06/11   small hh, noncritical Schatzki's ring s/p 60 F  . ESOPHAGOGASTRODUODENOSCOPY N/A 04/17/2015    [id number] s/p dilation  . ESOPHAGOGASTRODUODENOSCOPY (EGD) WITH PROPOFOL N/A 01/20/2018   Dr. [name]: Erosive gastropathy, normal-appearing esophagus status post empiric dilation.  Chronic gastritis, no H. pylori.  . ESOPHAGOSCOPY  03/15/2012   Procedure: ESOPHAGOSCOPY;  Surgeon: [name], MD;  Location: [facility];  Service: ENT;  Laterality: N/A;  . KNEE ARTHROSCOPY  04/27/2001  . MALONEY DILATION N/A 01/20/2018   Procedure: Venia Minks DILATION;  Surgeon: [name], MD;  [facility];  Service: Endoscopy;  Laterality: N/A;  . [place]  2003   negative bruce protocol exercise tress test; EF 68%; intermediate risk study due to evidence of anterior wall ischemia extending from mid-ventricle to apex  . PITUITARY SURGERY  06/2012   benign tumor[facility]  . RECTOCELE REPAIR    . TRANSTHORACIC ECHOCARDIOGRAM  2003   EF normal  . VAGINAL PROLAPSE REPAIR    . VIDEO BRONCHOSCOPY Bilateral 03/08/2017   Procedure: VIDEO BRONCHOSCOPY WITHOUT FLUORO;  Surgeon: Javier Glazier, MD;  Location: [facility];  Service: Cardiopulmonary;  Laterality: Bilateral;    OB History   No obstetric history on file.      Home Medications    Prior to Admission medications   Medication Sig Start Date End Date Taking? Authorizing Provider  Cholecalciferol 1.25 MG (50000 UT) capsule Take by mouth. 11/01/19 10/31/20 Yes [provider]  [place] test strip USE AS DIRECTED 08/08/18   Ladell Pier, MD  acetaminophen (TYLENOL) 500 MG tablet Take 1,000 mg by mouth 2 (two) times daily.     [provider]  Acetaminophen-Codeine 300-30 MG tablet acetaminophen 300 mg-codeine 30 mg tablet    [provider]  albuterol (VENTOLIN HFA) 108 (90 Base) MCG/ACT inhaler Inhale 1-2 puffs into the lungs every 6 (six) hours as needed for wheezing or shortness of breath. 11/22/19   Loura Halt A, NP  alum hydroxide-mag trisilicate (GAVISCON) [id number] MG CHEW  chewable tablet Chew by mouth.    [provider]  amitriptyline (ELAVIL) 50 MG tablet TAKE 1 TABLET BY MOUTH AT BEDTIME. 06/28/19   Ladell Pier, MD  amLODipine (NORVASC) 5 MG tablet Take 1 tablet (5 mg total) by mouth daily. *NEEDS OFFICE VISIT-2ND ATTEMPT* 06/13/19   Erlene Quan, PA-C  aspirin EC 81 MG tablet Take 81 mg by mouth at bedtime.    [provider]  atorvastatin (LIPITOR) 20 MG tablet Take 0.5 tablets (10 mg total) by mouth daily. 08/29/19   Lorretta Harp, MD  benzonatate (TESSALON) 100 MG capsule Take 1 capsule (100 mg total) by mouth every 8 (eight) hours. 11/22/19   Loura Halt A, NP  buPROPion (WELLBUTRIN XL) 300 MG 24 hr tablet TAKE 1 TABLET (300 MG TOTAL) BY MOUTH EVERY MORNING. 11/20/19   Ladell Pier, MD  calcium carbonate (TUMS - DOSED IN MG ELEMENTAL CALCIUM) 500 MG chewable tablet Chew 2 tablets by mouth at bedtime.    [provider]  celecoxib (CELEBREX) 100 MG capsule Take 100 mg by mouth 2 (two) times daily.    [provider]  cetirizine (ZYRTEC) 10 MG tablet Take 1 tablet (10 mg  total) by mouth daily. 11/22/19   Loura Halt A, NP  chlorpheniramine (CHLOR-TRIMETON) 4 MG tablet Take 4 mg by mouth every 4 (four) hours as needed for allergies.     [provider]  CINNAMON PO Take 1 capsule by mouth daily.    [provider]  dexamethasone (DECADRON) 0.5 MG tablet dexamethasone 0.5 mg tablet    [provider]  DEXILANT 60 MG capsule TAKE 1 CAPSULE (60 MG TOTAL) BY MOUTH DAILY. 06/14/19   Mahala Menghini, PA-C  diclofenac sodium (VOLTAREN) 1 % GEL Apply 2-4 g topically 4 (four) times daily as needed (PAIN.).    [provider]  DULoxetine (CYMBALTA) 30 MG capsule TAKE 1 CAPSULE (30 MG TOTAL) BY MOUTH DAILY. 11/20/19   Ladell Pier, MD  ergocalciferol (VITAMIN D2) 1.25 MG (50000 UT) capsule ergocalciferol (vitamin D2) 1,250 mcg (50,000 unit) capsule    [provider]   fexofenadine (ALLEGRA) 180 MG tablet Take 180 mg by mouth daily as needed for allergies or rhinitis.    [provider]  fluticasone (FLONASE) 50 MCG/ACT nasal spray Place 1 spray into both nostrils daily. 11/22/19   Loura Halt A, NP  furosemide (LASIX) 40 MG tablet TAKE 1/2 TO 1 TABLET BY MOUTH DAILY FOR LOWER EXTREMITY SWELLING 08/08/19   Ladell Pier, MD  gabapentin (NEURONTIN) 300 MG capsule TAKE 1 CAPSULE (300 MG TOTAL) BY MOUTH 4 (FOUR) TIMES DAILY. 09/14/19   Tanda Rockers, MD  glucose blood (ONETOUCH ULTRA) test strip Use as instructed 03/17/19   Ladell Pier, MD  glucose blood test strip Accu-Chek Aviva Plus test strips    [provider]  glucose blood test strip  10/24/19   [provider]  hydrALAZINE (APRESOLINE) 50 MG tablet Take 1 tablet (50 mg total) by mouth 3 (three) times daily. 10/03/19   Erlene Quan, PA-C  levothyroxine (SYNTHROID) 88 MCG tablet TAKE 1 TABLET BY MOUTH DAILY. 10/03/19   Ladell Pier, MD  LINZESS 290 MCG CAPS capsule TAKE 1 CAPSULE BY MOUTH DAILY BEFORE BREAKFAST. 10/04/19   Carlis Stable, NP  metFORMIN (GLUCOPHAGE) 500 MG tablet TAKE 1/2 TABLET DAILY WITH BREAKFAST 09/21/19   Ladell Pier, MD  Metoprolol Tartrate 75 MG TABS TAKE 1 TABLET BY MOUTH TWICE DAILY. 11/17/19   Ladell Pier, MD  montelukast (SINGULAIR) 10 MG tablet TAKE 1 TABLET BY MOUTH AT BEDTIME. 11/20/19   Ladell Pier, MD  Multiple Vitamin (MULTIVITAMIN WITH MINERALS) TABS tablet Take 1 tablet by mouth daily. Alive Women's Multivitamin    [provider]  nystatin (MYCOSTATIN/NYSTOP) powder Apply 1 application topically 3 (three) times daily. 10/17/19   Ladell Pier, MD  Omega-3 Fatty Acids (FISH OIL) 1000 MG CAPS Take 1,000 mg by mouth every evening.    [provider]  OVER THE COUNTER MEDICATION Take 1 capsule by mouth daily. BEET ROOT    [provider]  oxyCODONE-acetaminophen (PERCOCET) 10-325 MG  tablet Take 1 tablet by mouth 2 (two) times daily.     [provider]  polyethylene glycol-electrolytes (TRILYTE) 420 g solution Take 4,000 mLs by mouth as directed. 11/22/18   Rourk, Cristopher Estimable, MD  PREMARIN vaginal cream Place 1 Applicatorful vaginally at bedtime.  08/23/18   [provider]  sodium chloride (OCEAN) 0.65 % SOLN nasal spray Place 2 sprays into both nostrils every 4 (four) hours as needed for congestion.    [provider]  spironolactone (ALDACTONE) 25  MG tablet Take 1 tablet (25 mg total) by mouth daily. 11/20/19   Lorretta Harp, MD  vitamin B-12 (CYANOCOBALAMIN) 1000 MCG tablet Take 1,000 mcg by mouth daily.    [provider]    Family History Family History  Problem Relation Age of Onset  . Kidney cancer Father   . Hypertension Father   . Hyperlipidemia Father   . Sleep apnea Father   . Diabetes Mother   . Hypertension Mother   . Heart Problems Mother   . Hyperlipidemia Mother   . Asthma Mother   . Sleep apnea Mother   . Liver disease Brother   . Arthritis Brother   . Hypertension Sister   . Allergic rhinitis Sister   . Stomach cancer Other        aunt  . Breast cancer Maternal Grandmother   . Kidney disease Maternal Grandfather   . Breast cancer Paternal Grandmother   . Hypertension Brother   . Hyperlipidemia Brother   . Hypertension Brother   . Hypertension Sister   . Hyperlipidemia Sister   . Lupus Maternal Aunt   . Colon cancer Neg Hx   . Eczema Neg Hx   . Immunodeficiency Neg Hx   . Urticaria Neg Hx     Social History Social History   Tobacco Use  . Smoking status: Former Smoker    Packs/day: 0.50    Years: 10.00    Pack years: 5.00    Types: Cigarettes    Start date: 07/14/1975    Quit date: 04/04/1993    Years since quitting: 26.6  . Smokeless tobacco: Never Used  . Tobacco comment: 17 yrs ago  Substance Use Topics  . Alcohol use: No    Alcohol/week: 0.0 standard drinks  . Drug use: No      Allergies   Celexa [citalopram hydrobromide], Gabapentin, Norvasc [amlodipine besylate], and Diflucan [fluconazole]   Review of Systems Review of Systems   Physical Exam Triage Vital Signs ED Triage Vitals  Enc Vitals Group     BP 11/22/19 1209 (!) 130/59     Pulse Rate 11/22/19 1209 63     Resp 11/22/19 1209 19     Temp 11/22/19 1209 98 F (36.7 C)     Temp Source 11/22/19 1209 Oral     SpO2 --      Weight 11/22/19 1210 (!) 326 lb (147.9 kg)     Height --      Head Circumference --      Peak Flow --      Pain Score 11/22/19 1210 0     Pain Loc --      Pain Edu? --      Excl. in [place]? --    No data found.  Updated Vital Signs BP (!) 130/59 (BP Location: Left Arm)   Pulse 63   Temp 98 F (36.7 C) (Oral)   Resp 19   Wt (!) 326 lb (147.9 kg)   BMI 49.57 kg/m   Visual Acuity Right Eye Distance:   Left Eye Distance:   Bilateral Distance:    Right Eye Near:   Left Eye Near:    Bilateral Near:     Physical Exam Vitals and nursing note reviewed.  Constitutional:      General: She is not in acute distress.    Appearance: Normal appearance. She is not ill-appearing, toxic-appearing or diaphoretic.  HENT:     Head: Normocephalic.     Right Ear: Tympanic membrane  and ear canal normal.     Left Ear: Tympanic membrane and ear canal normal.     Nose: Nose normal.     Mouth/Throat:     Pharynx: Oropharynx is clear.  Eyes:     Conjunctiva/sclera: Conjunctivae normal.  Cardiovascular:     Rate and Rhythm: Normal rate and regular rhythm.  Pulmonary:     Effort: Pulmonary effort is normal.     Breath sounds: Normal breath sounds.  Musculoskeletal:     Cervical back: Normal range of motion.  Skin:    General: Skin is warm and dry.     Findings: No rash.  Neurological:     Mental Status: She is alert.  Psychiatric:        Mood and Affect: Mood normal.      UC Treatments / Results  Labs (all labs ordered are listed, but only abnormal results are displayed)  Labs Reviewed  NOVEL CORONAVIRUS, NAA (HOSP ORDER, SEND-OUT TO REF LAB; TAT 18-24 HRS)    EKG   Radiology US Guided Needle Placement - No Linked Charges  Result Date: 11/21/2019 Please see Notes tab for imaging impression.  XR HIP UNILAT W OR W/O PELVIS 2-3 VIEWS LEFT  Result Date: 11/21/2019 Significant degenerative changes to the left hip joint  XR KNEE 3 VIEW LEFT  Result Date: 11/21/2019 Significant tricompartmental degenerative changes   Procedures Procedures (including critical care time)  Medications Ordered in UC Medications - No data to display  Initial Impression / Assessment and Plan / UC Course  I have reviewed the triage vital signs and the nursing notes.  Pertinent labs & imaging results that were available during my care of the patient were reviewed by me and considered in my medical decision making (see chart for details).     Viral URI-exam benign No concerning signs or symptoms Treating patient symptoms with Flonase, Zyrtec and Tessalon Perles for cough.  She can use inhaler as needed for cough, wheezing or shortness of breath. Covid swab sent for testing with labs pending. Final Clinical Impressions(s) / UC Diagnoses   Final diagnoses:  Viral URI with cough     Discharge Instructions     Take the medications as prescribed Follow up as needed for continued or worsening symptoms We will call if your covid swab is positive     ED Prescriptions    Medication Sig Dispense Auth. Provider   cetirizine (ZYRTEC) 10 MG tablet Take 1 tablet (10 mg total) by mouth daily. 30 tablet Atif Chapple A, NP   fluticasone (FLONASE) 50 MCG/ACT nasal spray Place 1 spray into both nostrils daily. 16 g Kerrion Kemppainen A, NP   benzonatate (TESSALON) 100 MG capsule Take 1 capsule (100 mg total) by mouth every 8 (eight) hours. 21 capsule Laurice Kimmons A, NP   albuterol (VENTOLIN HFA) 108 (90 Base) MCG/ACT inhaler Inhale 1-2 puffs into the lungs every 6 (six) hours as  needed for wheezing or shortness of breath. 18 g Loura Halt A, NP     PDMP not reviewed this encounter.   Orvan July, NP 11/22/19 1307

## 2019-11-22 NOTE — ED Triage Notes (Signed)
Pt is here with nasal congestion, headaches, ear pain, cough that started 2 days ago, pt has NOT taken any meds to relieve discomfort.

## 2019-11-24 ENCOUNTER — Encounter (HOSPITAL_BASED_OUTPATIENT_CLINIC_OR_DEPARTMENT_OTHER): Payer: Medicaid Other | Admitting: Cardiovascular Disease

## 2019-11-24 ENCOUNTER — Ambulatory Visit: Payer: Medicaid Other | Admitting: Physical Therapy

## 2019-11-25 LAB — NOVEL CORONAVIRUS, NAA (HOSP ORDER, SEND-OUT TO REF LAB; TAT 18-24 HRS): SARS-CoV-2, NAA: NOT DETECTED

## 2019-11-27 DIAGNOSIS — F331 Major depressive disorder, recurrent, moderate: Secondary | ICD-10-CM | POA: Diagnosis not present

## 2019-11-28 ENCOUNTER — Other Ambulatory Visit: Payer: Self-pay

## 2019-11-28 ENCOUNTER — Ambulatory Visit: Payer: Medicaid Other | Admitting: Physical Therapy

## 2019-11-28 ENCOUNTER — Encounter: Payer: Self-pay | Admitting: Physical Therapy

## 2019-11-28 DIAGNOSIS — M6281 Muscle weakness (generalized): Secondary | ICD-10-CM

## 2019-11-28 DIAGNOSIS — R2681 Unsteadiness on feet: Secondary | ICD-10-CM

## 2019-11-28 DIAGNOSIS — R262 Difficulty in walking, not elsewhere classified: Secondary | ICD-10-CM

## 2019-11-28 NOTE — Therapy (Signed)
[facility] [address] [place], [place], [address] Phone: [phone]   Fax:  [phone]  Physical Therapy Treatment  Patient Details  Name: Robin [name] MRN: [id number] Date of Birth: [birth year] Referring Provider (PT): Karle Plumber, MD   Encounter Date: 11/28/2019  PT End of Session - 11/28/19 1023    Visit Number  5    Number of Visits  12    Date for PT Re-Evaluation  01/08/20    Authorization Type  Medicaid    Authorization Time Period  1/22-2/11/21- 3 visits; Medicaid approved 8 visits from 2/19-3/18/21    Authorization - Visit Number  1    Authorization - Number of Visits  8    PT Start Time  9038    PT Stop Time  1059    PT Time Calculation (min)  41 min    Equipment Utilized During Treatment  Other (comment)   RW with transfers   Activity Tolerance  Patient limited by pain;Patient tolerated treatment well;Patient limited by fatigue    Behavior During Therapy  [facility] for tasks assessed/performed       Past Medical History:  Diagnosis Date  . Anxiety disorder   . Arthritis   . Asthma   . Chronic neck pain   . Constipation   . Depression   . Diabetes mellitus without complication ([place])   . Dyspnea   . GERD (gastroesophageal reflux disease)   . Hiatal hernia    small  . Hyperlipidemia   . Hypertension   . Hypothyroidism   . Schatzki's ring    non critical  . Sleep apnea    CPAP, Sleep study at [facility]  . Sleep apnea     Past Surgical History:  Procedure Laterality Date  . ABDOMINAL HYSTERECTOMY    . ABDOMINAL HYSTERECTOMY  02/18/2000  . CARDIAC CATHETERIZATION  08/18/2003   normal L main/LAD/L Cfx/RCA (Dr. Adora Fridge)  . COLONOSCOPY  2006   Dr. [name], hyperplastic polyps  . COLONOSCOPY  08/06/11   abnormal terminal ileum for 10cm, erosions, geographical ulceration. Bx small bowel mucosa with prominent intramucosal lymphoid aggregates, slightly inflammed  . DIRECT  LARYNGOSCOPY  03/15/2012   Procedure: DIRECT LARYNGOSCOPY;  Surgeon: [name], MD;  Location: [facility];  Service: ENT;  Laterality: N/A; Dr. Wolicki--:>no foreign body seen. normal esophagus to 40cm  . ESOPHAGEAL DILATION  04/17/2015   Procedure: ESOPHAGEAL DILATION;  Surgeon: [name], MD;  [facility];  Service: Endoscopy;;  . ESOPHAGOGASTRODUODENOSCOPY  08/23/2008   BFX:OVAN distal esophageal erosions consistent with mild erosive reflux esophagitis, otherwise unremarkable esophagus/ Tiny antral erosions of doubtful clinical significance, otherwise normal stomach, patent pylorus, normal D1 and D2  . ESOPHAGOGASTRODUODENOSCOPY  08/06/11   small hh, noncritical Schatzki's ring s/p 8 F  . ESOPHAGOGASTRODUODENOSCOPY N/A 04/17/2015   VBT:YOMAYO s/p dilation  . ESOPHAGOGASTRODUODENOSCOPY (EGD) WITH PROPOFOL N/A 01/20/2018   Dr. [name]: Erosive gastropathy, normal-appearing esophagus status post empiric dilation.  Chronic gastritis, no H. pylori.  . ESOPHAGOSCOPY  03/15/2012   Procedure: ESOPHAGOSCOPY;  Surgeon: [name], MD;  Location: [facility];  Service: ENT;  Laterality: N/A;  . KNEE ARTHROSCOPY  04/27/2001  . MALONEY DILATION N/A 01/20/2018   Procedure: Venia Minks DILATION;  Surgeon: [name], MD;  [facility];  Service: Endoscopy;  Laterality: N/A;  . NM MYOCAR PERF WALL MOTION  2003   negative bruce protocol exercise tress test; EF 68%; intermediate  risk study due to evidence of anterior wall ischemia extending from mid-ventricle to apex  . PITUITARY SURGERY  06/2012   benign tumor, surgeon at [facility]  . RECTOCELE REPAIR    . TRANSTHORACIC ECHOCARDIOGRAM  2003   EF normal  . VAGINAL PROLAPSE REPAIR    . VIDEO BRONCHOSCOPY Bilateral 03/08/2017   Procedure: VIDEO BRONCHOSCOPY WITHOUT FLUORO;  Surgeon: Javier Glazier, MD;  Location: [facility];  Service: Cardiopulmonary;  Laterality: Bilateral;    There were no vitals filed for this visit.  Subjective  Assessment - 11/28/19 1022    Subjective  No falls. Saw the ortho Md who has mentioned future hip and knee replacements on left LE. Wants her to loose weight first.    Patient is accompained by:  Family member    Pertinent History  PMH: HTN, pre-DM, OSA, fibromyalgia, depression, obesity,  GERD, hypothyroid, OA of multiple js (hands, knees, shoulders)MRI pituitary adenoma: stable; pinched nerve in low back    Limitations  Walking;Standing    How long can you walk comfortably?  Walks if she needs to; always having pain    Patient Stated Goals  Pt's goal for therapy is to try to get around better than I am now.    Currently in Pain?  Yes    Pain Score  9     Pain Location  Leg    Pain Orientation  Left    Pain Descriptors / Indicators  Aching;Tender    Pain Type  Chronic pain    Pain Onset  More than a month ago    Pain Frequency  Intermittent    Aggravating Factors   arthritis, cold weather    Pain Relieving Factors  medication helps, resting, exercises help          [facility] Adult PT Treatment/Exercise - 11/28/19 1029      Transfers   Transfers  Sit to Stand;Stand to [employer] Transfers    Sit to Stand  4: Min guard;With upper extremity assist;From bed;From chair/3-in-1    Stand to Sit  4: Min guard;With upper extremity assist;To bed;To chair/3-in-1    Stand Pivot Transfers  4: Min guard    Stand Pivot Transfer Details (indicate cue type and reason)  with RW power chair to/from Nustep with cues on sequencing and posture with transfers      Knee/Hip Exercises: Aerobic   Nustep  level 2.0 with UE/LE's for 5 minutes, rest for 3 minutes, then 5 additional minutes of work with goal ~20-25 steps per minute.       Knee/Hip Exercises: Seated   Long Arc Quad  AROM;Strengthening;Both;1 set;10 reps;Weights;Limitations    Long Arc Quad Weight  1 lbs.    Long [employer] Limitations  cues for slow, controlled movements    Other Seated Knee/Hip Exercises  with 1# ankle weights- heel/toe  raises for 10 reps    Marching  Strengthening;AROM;Both;1 set;10 reps;Weights;Limitations    Marching Limitations  alternating LE"s with cues for increased height with lifting and controlled lowering of foot back to floor.     Marching Weights  1 lbs.    Hamstring Curl  AROM;Strengthening;Both;1 set;10 reps;Limitations    Hamstring Limitations  with yellow band resistance, cues for slow and controlled movements.     Abduction/Adduction   AROM;Strengthening;Both;1 set;10 reps;Limitations    Abd/Adduction Limitations  with yellow band resistance, cues for slow/controlled movements               PT Short Term Goals -  11/15/19 0952      PT SHORT TERM GOAL #1   Title  Pt will be independent with HEP to address strength, flexibility, balance, and gait progression.  TARGET 11/03/2019    Baseline  11/15/19: met with curent ex program    Status  Achieved    Target Date  11/03/19      PT SHORT TERM GOAL #2   Title  Pt will be ambulate at least 15 ft using RW (vs cane), min assist for improved gait within home.    Baseline  11/15/19: 20 feet with wide RW min gaurd to min assist    Time  --    Period  --    Status  Achieved    Target Date  11/03/19      PT SHORT TERM GOAL #3   Title  Pt will perform sit<>stand with minimal assistance for improved transfer efficiency and safety.    Baseline  11/15/19: met in session today    Time  --    Period  --    Status  Achieved    Target Date  11/03/19        PT Long Term Goals - 11/15/19 1700      PT LONG TERM GOAL #1   Title  Pt will be independent with progression of HEP for improved strength, flexibility, balance, and gait.  TARGET 12/04/2019    Baseline  11/15/19: current HEP will need to be updated as pt progresses    Time  7    Period  Weeks    Status  On-going      PT LONG TERM GOAL #2   Title  Pt will ambulate at least 40 ft using RW with min guard assistance, for improved gait efficiency and safety in the home.    Baseline   11/15/19: 20 feet with RW min guard to min assist    Time  7    Period  Weeks    Status  On-going      PT LONG TERM GOAL #3   Title  Pt will improve sit<>stand to supervision level, for improved transfer efficiency and safety.    Baseline  11/15/19: min guard to min assist for sit<>stands    Time  7    Period  Weeks    Status  On-going      PT LONG TERM GOAL #4   Title  Pt will perform at least 10 minutes of standing activities with UE support, for improved participation in ADLs and household tasks.    Baseline  Pt reports standing no more than 5 minutes at a time, limited due to pain    Time  7    Period  Weeks    Status  On-going      PT LONG TERM GOAL #5   Title  Pt will negotiate at least 3 steps with cane and HHA for improved negotiation of stairs into and out of home.    Baseline  Uses cane, banister and assist of family member at home    Time  7    Period  Weeks    Status  On-going            Plan - 11/28/19 1024    Clinical Impression Statement  Today's skilled session continued to focus on LE strengthening, transfers and activity tolerance. Rest breaks needed due to fatigue with no increase in pain reported. The pt is progressing and should benefit from continued PT to  progress toward LTGs.    Personal Factors and Comorbidities  Comorbidity 3+    Comorbidities  HTN, pre-DM, OSA, fibromyalgia, depression, obesity,  GERD, hypothyroid, OA of multiple js (hands, knees, shoulders)MRI pituitary adenoma: stable    Examination-Activity Limitations  Stand;Stairs;Locomotion Level;Transfers    Examination-Participation Restrictions  Community Activity;Meal Prep;Other   Standing ADLs   Stability/Clinical Decision Making  Evolving/Moderate complexity    Rehab Potential  Good    PT Frequency  Other (comment)   1x/wk for 3 weeks; then 2x/wk for 4 weeks   PT Duration  Other (comment)   7 weeks total POC   PT Treatment/Interventions  ADLs/Self Care Home Management;Aquatic  Therapy;Gait training;Stair training;Functional mobility training;Therapeutic activities;Therapeutic exercise;Balance training;Neuromuscular re-education;Patient/family education    PT Next Visit Plan  standing tolerance/balance/gait and stairs as able, LE strengthening    PT Home Exercise Plan  Access Code: [email or web address]    Consulted and Agree with Plan of Care  Patient       Patient will benefit from skilled therapeutic intervention in order to improve the following deficits and impairments:  Abnormal gait, Decreased range of motion, Difficulty walking, Decreased endurance, Decreased activity tolerance, Pain, Decreased balance, Impaired flexibility, Decreased mobility, Decreased strength  Visit Diagnosis: Muscle weakness (generalized)  Unsteadiness on feet  Difficulty in walking, not elsewhere classified     Problem List Patient Active Problem List   Diagnosis Date Noted  . Multilevel degenerative disc disease 10/31/2018  . Abdominal pain 10/28/2018  . Change in bowel function 10/28/2018  . Gastritis and gastroduodenitis 04/26/2018  . Atrophic vaginitis 04/01/2018  . Diabetes mellitus ([place]) 03/31/2018  . Upper airway cough syndrome 02/08/2018  . Dysphagia 12/14/2017  . 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 Urinary incontinence 07/23/2017  . Early satiety 06/16/2017  . Hx of iron deficiency anemia 05/28/2017  . Throat pain in adult 04/05/2017  . Prediabetes 02/04/2017  . Dyspnea on exertion 12/23/2016  . Chronic allergic rhinitis 12/23/2016  . Hemoptysis 12/23/2016  . Fibromyalgia 08/18/2016  . Chronic lymphocytic thyroiditis 08/18/2016  . Depression 04/01/2016  . OSA (obstructive sleep apnea) 04/01/2016  . Essential hypertension 12/09/2015  . Hypothyroidism 12/09/2015  . Morbid obesity due to excess calories ([place]) 12/09/2015  . Constipation 05/27/2015  . Fatty liver 05/27/2015  . Abdominal pain, chronic, epigastric 07/04/2012  . Anxiety 06/09/2012  . History of gastroesophageal  reflux (GERD) 06/09/2012  . Hyperlipidemia 06/09/2012  . Refusal of blood transfusions as patient is Jehovah's Witness 06/09/2012  . 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 Abnormal small bowel biopsy 09/18/2011  . Lactose intolerance 09/18/2011  . GERD 01/21/2010    Willow Ora, PTA, [facility] [address], [place], [place] [address] [phone] 11/28/19, 4:36 PM   Name: Robin [name] MRN: [id number] Date of Birth: [birth year]

## 2019-11-29 ENCOUNTER — Encounter: Payer: Self-pay | Admitting: Internal Medicine

## 2019-11-29 DIAGNOSIS — F331 Major depressive disorder, recurrent, moderate: Secondary | ICD-10-CM | POA: Diagnosis not present

## 2019-11-29 NOTE — Progress Notes (Signed)
Received note from [facility].  Pt seen 11/20/19. Found to have peripheral vision loss.  F/u in 6 mths.

## 2019-11-30 ENCOUNTER — Other Ambulatory Visit: Payer: Self-pay

## 2019-11-30 ENCOUNTER — Ambulatory Visit: Payer: Medicaid Other | Admitting: Physical Therapy

## 2019-11-30 DIAGNOSIS — M6281 Muscle weakness (generalized): Secondary | ICD-10-CM

## 2019-11-30 DIAGNOSIS — M25551 Pain in right hip: Secondary | ICD-10-CM | POA: Diagnosis not present

## 2019-11-30 DIAGNOSIS — R2681 Unsteadiness on feet: Secondary | ICD-10-CM

## 2019-11-30 DIAGNOSIS — M25552 Pain in left hip: Secondary | ICD-10-CM | POA: Diagnosis not present

## 2019-11-30 DIAGNOSIS — M79641 Pain in right hand: Secondary | ICD-10-CM | POA: Diagnosis not present

## 2019-11-30 DIAGNOSIS — R262 Difficulty in walking, not elsewhere classified: Secondary | ICD-10-CM | POA: Diagnosis not present

## 2019-11-30 DIAGNOSIS — M545 Low back pain: Secondary | ICD-10-CM | POA: Diagnosis not present
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
## 2019-11-30 NOTE — Patient Instructions (Addendum)
Access Code: [email or web address] URL: [email or web address] Date: 11/30/2019 Prepared by: Mady Haagensen  Exercises Seated Heel Toe Raises - 10 reps - 1 sets - 1x daily - 5x weekly Seated Long Arc Quad - 10 reps - 1 sets - 1x daily - 5x weekly Seated Hamstring Curl with Anchored Resistance - 10 reps - 1 sets - 1x daily - 5x weekly Seated March - 10 reps - 1 sets - 1x daily - 5x weekly Seated Hip Abduction with Resistance - 10 reps - 1 sets - 1x daily - 5x weekly Seated Hamstring Stretch - 3 reps - 1 sets - 30 hold - 1x daily - 5x weekly Seated Hip Adduction Squeeze with Ball - 10 reps - 1 sets - 5 hold - 1x daily - 5x weekly  Added 11/30/2019 Seated Gluteal Sets - 10 reps - 1-2 sets - 1x daily - 5x weekly Seated Scapular Retraction - 10 reps - 1-2 sets - 1x daily - 5x weekly (additional written instruction for cross body punches x 5 reps, alternating UE lifts x 5 reps)

## 2019-12-04 ENCOUNTER — Other Ambulatory Visit: Payer: Self-pay | Admitting: Internal Medicine

## 2019-12-04 ENCOUNTER — Other Ambulatory Visit: Payer: Self-pay

## 2019-12-04 ENCOUNTER — Ambulatory Visit (INDEPENDENT_AMBULATORY_CARE_PROVIDER_SITE_OTHER)
Admission: RE | Admit: 2019-12-04 | Discharge: 2019-12-04 | Disposition: A | Discharge status: Home / Self Care | Payer: Medicaid Other | Source: Ambulatory Visit | Attending: Internal Medicine | Admitting: Internal Medicine

## 2019-12-04 ENCOUNTER — Ambulatory Visit: Payer: Medicaid Other | Admitting: Internal Medicine

## 2019-12-04 ENCOUNTER — Encounter: Payer: Self-pay | Admitting: Internal Medicine

## 2019-12-04 DIAGNOSIS — R05 Cough: Secondary | ICD-10-CM

## 2019-12-04 DIAGNOSIS — R058 Other specified cough: Secondary | ICD-10-CM

## 2019-12-04 DIAGNOSIS — R0602 Shortness of breath: Secondary | ICD-10-CM | POA: Diagnosis not present

## 2019-12-04 MED ORDER — PREDNISONE 10 MG PO TABS: ORAL_TABLET | ORAL | 0 refills | Status: DC

## 2019-12-04 MED ORDER — FAMOTIDINE 20 MG PO TABS: ORAL_TABLET | ORAL | 11 refills | Status: DC

## 2019-12-04 MED FILL — metFORMIN HCL 500 MG TABS: 500 | 30 days supply | Qty: 15 | Fill #0

## 2019-12-04 MED FILL — predniSONE 10 MG TABS: 10 | 6 days supply | Qty: 14 | Fill #0

## 2019-12-04 MED FILL — FAMOTIDINE 20 MG TABS: 20 | 30 days supply | Qty: 30 | Fill #0

## 2019-12-04 MED FILL — LEVOTHYROXINE 88 MCG TABLET: 88 | 30 days supply | Qty: 30 | Fill #2

## 2019-12-04 MED FILL — ATORVASTATIN CALCIUM 20 MG: 20 | 30 days supply | Qty: 15 | Fill #3

## 2019-12-04 NOTE — Patient Instructions (Addendum)
Prednisone 10 mg take  4 each am x 2 days,   2 each am x 2 days,  1 each am x 2 days and stop   Try dexilant 60 mg   Take 30-60 min before first meal of the day and Pepcid ac (famotidine) 20 mg one after supper until return to office    GERD (REFLUX)  is an extremely common cause of respiratory symptoms just like yours , many times with no obvious heartburn at all.    It can be treated with medication, but also with lifestyle changes including elevation of the head of your bed (ideally with 6 -8inch blocks under the headboard of your bed),  Smoking cessation, avoidance of late meals, excessive alcohol, and avoid fatty foods, chocolate, peppermint, colas, red wine, and acidic juices such as orange juice.  NO MINT OR MENTHOL PRODUCTS SO NO COUGH DROPS  USE SUGARLESS CANDY INSTEAD (Jolley ranchers or Stover's or Life Savers) or even ice chips will also do - the key is to swallow to prevent all throat clearing. NO OIL BASED VITAMINS - use powdered substitutes.  Avoid fish oil when coughing.  For congestion ok to chlotrabs 4 mg x 2 every hours as needed   Only use your albuterol as a rescue medication to be used if you can't catch your breath by resting or doing a relaxed purse lip breathing pattern.  - The less you use it, the better it will work when you need it. - Ok to use up to 2 puffs  every 4 hours if you must but call for immediate appointment if use goes up over your usual need - Don't leave home without it !!  (think of it like the spare tire for your car)   See  ENT eval/ [facility] re your cough and ears   Please remember to go to the  x-ray department  for your tests - we will call you with the results when they are available    Please schedule a follow up office visit in 4 weeks, sooner if needed  with all medications /inhalers/ solutions in hand so we can verify exactly what you are taking. This includes all medications from all doctors and over the counters

## 2019-12-04 NOTE — Progress Notes (Signed)
Subjective:     Patient ID: Robin [name], female   DOB: [birth year]       MRN: [id number]     Brief patient profile:  37  yobf quit smoking in 1994 seen previously for dyspnea, chronic allergic rhinitis and hemoptysis (felt secondary to erosions from tonsils status post bronchoscopy) - has sensation of globus/ dysphagia dating back at least to 2009   Has seen Dr [name] for pos allergies but does not recall details (even symptoms)      TESTS  PFT 02/09/17: FVC  2.35 L (77%) FEV1 2.06 L (85%) FEV1/FVC 0.88 FEF 25-75 3.32 L (142%) negative bronchodilator response TLC 3.74 L (68%) RV 71% ERV 28% DLCO corrected 71% 08/22/15: FVC 2.32 L (119%) FEV1 1.99 L (121%) FEV1/FVC 0.85 FEF 25-75 2.59 L (118%)  6MWT 02/16/17:  Walked 126 meters / Baseline Sat 99%  On RA / Nadir Sat 99% on RA  METHACHOLINE CHALLENGE TEST (02/24/17):  Normal bronchial hyperreactivity.  IMAGING BARIUM SWALLOW/ESOPHAGRAM 04/30/17 (per radiologist):  Hiatal hernia with mild reflux. Esophageal dysmotility  CT NECK/SOFT TISSUE W/ CONTRAST 04/19/17 (per radiologist): No inflammatory change or foreign body can be seen in the pharynx.  Cervical spondylosis. 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  CT CHEST W/O 01/07/17   No parenchymal nodule, mass, or opacity appreciated. No pleural effusion or thickening. No pericardial effusion. No pathologic mediastinal adenopathy.  CTA CHEST 12/13/15   No pulmonary emboli. No pleural effusion or thickening. No pericardial effusion. No pathologic mediastinal adenopathy. No parenchymal nodule, mass, or opacification.  CARDIAC TTE (09/29/16): LV normal in size with moderate concentric hypertrophy. EF 55-60% with no regional wall motion abnormalities. Indeterminant diastolic function. LA & RA normal in size. RV normal in size and function. No aortic stenosis or regurgitation. Aortic root normal in size. No mitral stenosis or regurgitation. No  significant pulmonic regurgitation. No significant tricuspid regurgitation. No pericardial effusion.  LEFT HEART CATHETERIZATION (11/13/3):  Aortic systolic pressure 944  Diastolic pressure 90  Left ventricular systolic pressure 967  End-diastolic pressure 27 left main coronary artery normal  Left anterior descending artery normal  Left circumflex coronary artery normal  Right coronary artery dominant and normal   Overall estimated ejection fraction greater than 60% without focal wall motion abnormality  MICROBIOLOGY Endobronchial/Endotracheal Brushing 03/08/17: Rare Haemophilus influenza beta lactamase positive  PATHOLOGY Endobronchial/Endotracheal Brushing 03/08/17: No malignancy. Negative for herpes & HPV.  LABS 05/28/17 BNP:  6.1 ESR:  34 TSH:  1.060 Anti-CCP:  6 RF:  <10  02/16/17 IgE: 81 RAST panel: Cockroach 0.2 / D. farinae 0.2 / D. pteronyssinus 0.24   12/23/16 INR: 1.0 PTT: 24.1 Chromatin Ab:  <0.2 Smith Ab: <0.2 DS DNA Ab:  <1 SSA:  <0.2 SSB:  <0.2 Anti-CCP:  <16 SCL-70:  <0.2 ENA RNP Ab: Centromere Ab Screen:  <0.2 Jo-1 Ab:  <0.2  05/12/16 ANA: Negative Rheumatoid factor:  <10     02/08/2018 acute extended ov/Jarika Robben re:  acute on chronic cough x decades / establish with me [name] pt) Chief Complaint  Patient presents with  . Acute Visit    Pt c/o cough with yellow, wheezing and SOB x 3 days. She states her chest feels sore when she coughs. She has been using her albuterol inahler 3 x daily on average.   baseline = maint protonix right before first meal then before supper, and singuliar and needing saba  3 x  Times a day but not noct then severe  cough /subj  wheeze/ chest soreness 3 days prior to OV and sob with any activity  rec Prednisone 10 mg take  4 each am x 2 days,   2 each am x 2 days,  1 each am x 2 days and stop  zpak Protonix 40  Mg Take 30- 60 min before your first and last meals of the day  For drainage / throat tickle try take  CHLORPHENIRAMINE  4 mg - take one every 4 hours as needed   Take delsym two tsp every 12 hours and supplement if needed with  tramadol 50 mg up to 2 every 4 hours GERD  Diet   Please schedule a follow up office visit in 4 weeks, sooner if needed  with all medications /inhalers/ solutions in hand so we can verify exactly what you are taking. This includes all medications from all doctors and over the counters      03/08/2018  f/u ov/Matalie Romberger re:  Cough x decades / did not bring all meds but most of them  Chief Complaint  Patient presents with  . Follow-up    Breathing is unchanged. She is still coughing up yellow sputum. She is using her albuterol inhaler 3 x per wk on average.   Dyspnea:  Even if not coughing doe x room to room  X  Years = MMRC3 = can't walk 100 yards even at a slow pace at a flat grade s stopping due to sob  = stops a lot to shop  Cough: fits of cough are  Sporadic daytime mostly  Sleep: she says fine, husband hears wheezing  SABA use:  Doesn't really help/ note MCT neg  rec Stop corevidol and losartan and instead try bisoprolol 5 mg twice daily  For cough try tessalon 200 mg every 8 hours as needed  For drainage / throat tickle try take CHLORPHENIRAMINE  4 mg - take one every 4 hours as needed   Please see patient coordinator before you leave today  to schedule sinus CT> never done denied by insurance          07/19/2018  Acute extended  ov/Jefte Carithers re: cough / dysphagia/ globus > 10 y worse since off gabapentin 300 mg x 4 weeks with worse doe also / did not bring updated med calendar as requested  Chief Complaint  Patient presents with  . Acute Visit    Increased SOB and cough x 3-4 wks. Her cough is occ prod with green to yellow sputum.  She also c/o hoarseness.   Dyspnea:   MMRC3 = can't walk 100 yards even at a slow pace at a flat grade s stopping due to sob  - can't do  [employer] Cough:  sporadic Sleeping: sleeps fine bed flat/ 2 pillows  SABA use: none Says  tessalon works the best for cough rec Add back gabapentin 300 mg four times a day as per med calendar Change tessilon to 200 mg up to every 8 hours as needed for cough  and stop delsym If can't stop coughing then add hydrocodone up to every 4 hours if needed  If not better next step is to return to ENT ([place]) then if not satisfied please return here  with all medications /inhalers/ solutions     05/11/2019  f/u ov/Jabe Jeanbaptiste re: uacs / MO with doe /brought meds but they don't correlate well with med list  Chief Complaint  Patient presents with  . Follow-up    Patient reports that   her sob has increased and she still has a dry cough.   Dyspnea:  Was able to walk at [place], now using scooter x 2 month Cough: still severe cough day > noct, dry > wet with globus sensatin and hoarseness but still subj "wheezeling"  Sleeping: sleeps ok  in flat bed, several pillows SABA use: no better with saba  02: none  Overt hb despite dexilant / no longer on aciphex though still listed rec Prednisone 10 mg take  4 each am x 2 days,   2 each am x 2 days,  1 each am x 2 days and stop  GERD (REFLUX)   For congestion ok to chlotrabs 4 mg x 2 every hours as needed  We will  schedule ENT eval/ [facility] re your cough    12/04/2019  f/u ov/Gracianna Vink re: uacs  Chief Complaint  Patient presents with  . Follow-up    Increased cough and congestion x 4 months. She will occ produce some yellow to green sputum. She is using her albuterol inhaler about 2 x per day on average.   Dyspnea:  Uses walker across the room then stops < 25 ft x months  Cough: esp daytime min mucoid Sleeping: on side flat bed 4 pillows  SABA use: 2 different albuterols/ easily confused with details of care  02: none    No obvious day to day or daytime variability or assoc excess/ purulent sputum or mucus plugs or hemoptysis or cp or chest tightness, subjective wheeze or overt sinus or hb symptoms.   Sleeping as above without nocturnal  or early am  exacerbation  of respiratory  c/o's or need for noct saba. Also denies any obvious fluctuation of symptoms with weather or environmental changes or other aggravating or alleviating factors except as outlined above   No unusual exposure hx or h/o childhood pna/ asthma or knowledge of premature birth.  Current Allergies, Complete Past Medical History, Past Surgical History, Family History, and Social History were reviewed in [employer] record.  ROS  The following are not active complaints unless bolded Hoarseness, sore throat, dysphagia, dental problems, itching, sneezing,  nasal congestion or discharge of excess mucus or purulent secretions, ear ache,   fever, chills, sweats, unintended wt loss or wt gain, classically pleuritic or exertional cp,  orthopnea pnd or arm/hand swelling  or leg swelling, presyncope, palpitations, abdominal pain, anorexia, nausea, vomiting, diarrhea  or change in bowel habits or change in bladder habits, change in stools or change in urine, dysuria, hematuria,  rash, arthralgias, visual complaints, headache, numbness, weakness or ataxia or problems with walking or coordination,  change in mood or  memory.        Current Meds  Medication Sig  . ACCU-CHEK AVIVA PLUS test strip USE AS DIRECTED  . acetaminophen (TYLENOL) 500 MG tablet Take 1,000 mg by mouth 2 (two) times daily.   . Acetaminophen-Codeine 300-30 MG tablet acetaminophen 300 mg-codeine 30 mg tablet  . albuterol (VENTOLIN HFA) 108 (90 Base) MCG/ACT inhaler Inhale 1-2 puffs into the lungs every 6 (six) hours as needed for wheezing or shortness of breath.  Marland Kitchen alum hydroxide-mag trisilicate (GAVISCON) 25-49 MG CHEW chewable tablet Chew by mouth.  Marland Kitchen amitriptyline (ELAVIL) 50 MG tablet TAKE 1 TABLET BY MOUTH AT BEDTIME.  Marland Kitchen amLODipine (NORVASC) 5 MG tablet Take 1 tablet (5 mg total) by mouth daily. *NEEDS OFFICE VISIT-2ND ATTEMPT*  . aspirin EC 81 MG tablet Take 81 mg by mouth at bedtime.  Marland Kitchen  atorvastatin (LIPITOR) 20 MG tablet Take 0.5 tablets (10 mg total) by mouth daily.  . benzonatate (TESSALON) 100 MG capsule Take 1 capsule (100 mg total) by mouth every 8 (eight) hours.  Marland Kitchen buPROPion (WELLBUTRIN XL) 300 MG 24 hr tablet TAKE 1 TABLET (300 MG TOTAL) BY MOUTH EVERY MORNING.  . calcium carbonate (TUMS - DOSED IN MG ELEMENTAL CALCIUM) 500 MG chewable tablet Chew 2 tablets by mouth at bedtime.  . celecoxib (CELEBREX) 100 MG capsule Take 100 mg by mouth 2 (two) times daily.  . cetirizine (ZYRTEC) 10 MG tablet Take 1 tablet (10 mg total) by mouth daily.  . chlorpheniramine (CHLOR-TRIMETON) 4 MG tablet Take 4 mg by mouth every 4 (four) hours as needed for allergies.   . Cholecalciferol 1.25 MG (50000 UT) capsule Take by mouth.  Marland Kitchen CINNAMON PO Take 1 capsule by mouth daily.  Marland Kitchen dexamethasone (DECADRON) 0.5 MG tablet dexamethasone 0.5 mg tablet  . DEXILANT 60 MG capsule TAKE 1 CAPSULE (60 MG TOTAL) BY MOUTH DAILY.  Marland Kitchen diclofenac sodium (VOLTAREN) 1 % GEL Apply 2-4 g topically 4 (four) times daily as needed (PAIN.).  Marland Kitchen DULoxetine (CYMBALTA) 30 MG capsule TAKE 1 CAPSULE (30 MG TOTAL) BY MOUTH DAILY.  . ergocalciferol (VITAMIN D2) 1.25 MG (50000 UT) capsule ergocalciferol (vitamin D2) 1,250 mcg (50,000 unit) capsule  . fexofenadine (ALLEGRA) 180 MG tablet Take 180 mg by mouth daily as needed for allergies or rhinitis.  . fluticasone (FLONASE) 50 MCG/ACT nasal spray Place 1 spray into both nostrils daily.  . furosemide (LASIX) 40 MG tablet TAKE 1/2 TO 1 TABLET BY MOUTH DAILY FOR LOWER EXTREMITY SWELLING  . gabapentin (NEURONTIN) 300 MG capsule TAKE 1 CAPSULE (300 MG TOTAL) BY MOUTH 4 (FOUR) TIMES DAILY.  Marland Kitchen glucose blood (ONETOUCH ULTRA) test strip Use as instructed  . glucose blood test strip Accu-Chek Aviva Plus test strips  . glucose blood test strip   . hydrALAZINE (APRESOLINE) 50 MG tablet Take 1 tablet (50 mg total) by mouth 3 (three) times daily.  Marland Kitchen levothyroxine (SYNTHROID) 88 MCG tablet  TAKE 1 TABLET BY MOUTH DAILY.  Marland Kitchen LINZESS 290 MCG CAPS capsule TAKE 1 CAPSULE BY MOUTH DAILY BEFORE BREAKFAST.  . metFORMIN (GLUCOPHAGE) 500 MG tablet TAKE 1/2 TABLET DAILY WITH BREAKFAST  . Metoprolol Tartrate 75 MG TABS TAKE 1 TABLET BY MOUTH TWICE DAILY.  . montelukast (SINGULAIR) 10 MG tablet TAKE 1 TABLET BY MOUTH AT BEDTIME.  . Multiple Vitamin (MULTIVITAMIN WITH MINERALS) TABS tablet Take 1 tablet by mouth daily. Alive Women's Multivitamin  . nystatin (MYCOSTATIN/NYSTOP) powder Apply 1 application topically 3 (three) times daily.  . Omega-3 Fatty Acids (FISH OIL) 1000 MG CAPS Take 1,000 mg by mouth every evening.  Marland Kitchen OVER THE COUNTER MEDICATION Take 1 capsule by mouth daily. BEET ROOT  . oxyCODONE-acetaminophen (PERCOCET) 10-325 MG tablet Take 1 tablet by mouth 2 (two) times daily.   . polyethylene glycol-electrolytes (TRILYTE) 420 g solution Take 4,000 mLs by mouth as directed.  Marland Kitchen PREMARIN vaginal cream Place 1 Applicatorful vaginally at bedtime.   . sodium chloride (OCEAN) 0.65 % SOLN nasal spray Place 2 sprays into both nostrils every 4 (four) hours as needed for congestion.  Marland Kitchen spironolactone (ALDACTONE) 25 MG tablet Take 1 tablet (25 mg total) by mouth daily.  . vitamin B-12 (CYANOCOBALAMIN) 1000 MCG tablet Take 1,000 mcg by mouth daily.                     Objective:  Physical Exam   Obese w/c bound bf nad    12/04/2019           355  05/11/2019           331  07/19/2018       328  03/08/2018            337   02/08/18 (!) 333 lb (151 kg)  01/27/18 (!) 328 lb 9.6 oz (149.1 kg)  12/14/17 (!) 326 lb 6.4 oz (148.1 kg)     Vital signs reviewed  12/04/2019  - Note at rest 02 sats  97% on RA     HEENT : pt wearing mask not removed for exam due to covid -19 concerns.    NECK :  without JVD/Nodes/TM/ nl carotid upstrokes bilaterally   LUNGS: no acc muscle use,  Nl contour chest which is clear to A and P bilaterally without cough on insp or exp maneuvers   CV:  RRR  no s3  or murmur or increase in P2, and no edema   ABD:  Markedly obese with limited inspiratory excursion   No bruits or organomegaly appreciated, bowel sounds nl  MS:   ext warm without deformities, calf tenderness, cyanosis or clubbing No obvious joint restrictions   SKIN: warm and dry without lesions    NEURO:  alert, approp, nl sensorium with  no motor or cerebellar deficits apparent.        CXR PA and Lateral:   12/04/2019 :    I personally reviewed images and agree with radiology impression as follows:   No radiographic evidence of acute cardiopulmonary disease.     Assessment:

## 2019-12-05 ENCOUNTER — Encounter: Payer: Self-pay | Admitting: Internal Medicine

## 2019-12-05 ENCOUNTER — Ambulatory Visit: Payer: Medicaid Other | Admitting: Orthopaedic Surgery

## 2019-12-05 ENCOUNTER — Ambulatory Visit: Payer: Medicaid Other | Admitting: Physical Therapy

## 2019-12-05 NOTE — Progress Notes (Signed)
Spoke with pt and notified of results per Dr. Wert. Pt verbalized understanding and denied any questions. 

## 2019-12-05 NOTE — Progress Notes (Signed)
LMTCB

## 2019-12-05 NOTE — Assessment & Plan Note (Signed)
Allergy profile 02/16/17  >  Eos 0.1 /  IgE  81 pos dust/ cockroach  - MCT 02/24/17 neg  - DgEs  01/20/18 : Small reducible hiatal hernia with trace gastroesophageal reflux. No evidence of stricture. - Cyclical cough protocol 02/08/2018  - sinus CT 03/10/2018 >>> informed  Insurance decllned> referred back to ENT Robin [name]) [id number] - worse off gabapentin 07/19/2018 > restart 300 qid   No evidence of a pulmonary source of her cough at this point but likely has poorly controlled rhinitis/sinusitis causing throat clearing causing more GERD on a cyclical basis.   Of the three most common causes of  Sub-acute / recurrent or chronic cough, only one (GERD)  can actually contribute to/ trigger  the other two (asthma and post nasal drip syndrome)  and perpetuate the cylce of cough.  While not intuitively obvious, many patients with chronic low grade reflux do not cough until there is a primary insult that disturbs the protective epithelial barrier and exposes sensitive nerve endings.   This is typically viral but can due to PNDS and  either may apply here.     >>>The point is that once this occurs, it is difficult to eliminate the cycle  using anything but a maximally effective acid suppression regimen at least in the short run, accompanied by an appropriate diet to address non acid GERD and control pnds with 1st gen H1 blockers per guidelines  And  Also added 6 days of Prednisone in case of component of Th-2 driven upper or lower airways inflammation (if cough responds short term only to relapse befor return while will on rx for uacs that would point to allergic rhinitis/ asthma or eos bronchitis)     >>> next step is re-eval by ent/ GI and pulmonary f/u can be prn           Each maintenance medication was reviewed in detail including emphasizing most importantly the difference between maintenance and prns and under what circumstances the prns are to be triggered using an action plan format where  appropriate.  Total time for H and P, chart review, counseling,  and generating customized AVS unique to this office visit addressing refractory symptoms  / charting = 30 min

## 2019-12-05 NOTE — Assessment & Plan Note (Signed)
Body mass index is 53.98 kg/m.  -  trending up Lab Results  Component Value Date   TSH 2.390 11/13/2019     Contributing to gerd risk/ doe/reviewed the need and the process to achieve and maintain neg calorie balance > defer f/u primary care including intermittently monitoring thyroid status

## 2019-12-06 ENCOUNTER — Other Ambulatory Visit (HOSPITAL_COMMUNITY)
Admission: RE | Admit: 2019-12-06 | Discharge: 2019-12-06 | Disposition: A | Discharge status: Home / Self Care | Payer: Medicaid Other | Source: Ambulatory Visit | Attending: Cardiovascular Disease | Admitting: Cardiovascular Disease

## 2019-12-06 DIAGNOSIS — Z01812 Encounter for preprocedural laboratory examination: Secondary | ICD-10-CM | POA: Diagnosis not present

## 2019-12-06 DIAGNOSIS — Z20822 Contact with and (suspected) exposure to covid-19: Secondary | ICD-10-CM | POA: Insufficient documentation

## 2019-12-06 LAB — SARS CORONAVIRUS 2 (TAT 6-24 HRS): SARS Coronavirus 2: NEGATIVE

## 2019-12-07 ENCOUNTER — Ambulatory Visit: Payer: Medicaid Other | Attending: Internal Medicine | Admitting: Physical Therapy

## 2019-12-07 ENCOUNTER — Other Ambulatory Visit: Payer: Self-pay

## 2019-12-07 DIAGNOSIS — R262 Difficulty in walking, not elsewhere classified: Secondary | ICD-10-CM | POA: Diagnosis not present

## 2019-12-07 DIAGNOSIS — M6281 Muscle weakness (generalized): Secondary | ICD-10-CM | POA: Diagnosis not present

## 2019-12-07 DIAGNOSIS — R2681 Unsteadiness on feet: Secondary | ICD-10-CM
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
## 2019-12-08 ENCOUNTER — Ambulatory Visit (HOSPITAL_BASED_OUTPATIENT_CLINIC_OR_DEPARTMENT_OTHER): Payer: Medicaid Other | Attending: Cardiovascular Disease | Admitting: Cardiovascular Disease

## 2019-12-08 DIAGNOSIS — G4733 Obstructive sleep apnea (adult) (pediatric): Secondary | ICD-10-CM | POA: Diagnosis not present

## 2019-12-08 DIAGNOSIS — R0902 Hypoxemia: Secondary | ICD-10-CM | POA: Diagnosis not present

## 2019-12-08 DIAGNOSIS — Z7982 Long term (current) use of aspirin: Secondary | ICD-10-CM | POA: Diagnosis not present

## 2019-12-08 DIAGNOSIS — E669 Obesity, unspecified: Secondary | ICD-10-CM | POA: Diagnosis not present

## 2019-12-08 DIAGNOSIS — Z79899 Other long term (current) drug therapy: Secondary | ICD-10-CM | POA: Diagnosis not present

## 2019-12-08 DIAGNOSIS — Z791 Long term (current) use of non-steroidal anti-inflammatories (NSAID): Secondary | ICD-10-CM | POA: Insufficient documentation

## 2019-12-08 DIAGNOSIS — Z7901 Long term (current) use of anticoagulants: Secondary | ICD-10-CM | POA: Diagnosis not present

## 2019-12-08 DIAGNOSIS — Z6841 Body Mass Index (BMI) 40.0 and over, adult: Secondary | ICD-10-CM | POA: Diagnosis not present

## 2019-12-08 DIAGNOSIS — Z7984 Long term (current) use of oral hypoglycemic drugs: Secondary | ICD-10-CM | POA: Insufficient documentation

## 2019-12-11 DIAGNOSIS — Z6841 Body Mass Index (BMI) 40.0 and over, adult: Secondary | ICD-10-CM | POA: Diagnosis not present

## 2019-12-11 DIAGNOSIS — E039 Hypothyroidism, unspecified: Secondary | ICD-10-CM | POA: Diagnosis not present

## 2019-12-11 DIAGNOSIS — E782 Mixed hyperlipidemia: Secondary | ICD-10-CM | POA: Diagnosis not present

## 2019-12-11 DIAGNOSIS — I10 Essential (primary) hypertension: Secondary | ICD-10-CM | POA: Diagnosis not present

## 2019-12-11 DIAGNOSIS — E559 Vitamin D deficiency, unspecified: Secondary | ICD-10-CM | POA: Diagnosis not present

## 2019-12-11 DIAGNOSIS — M8949 Other hypertrophic osteoarthropathy, multiple sites: Secondary | ICD-10-CM | POA: Diagnosis not present

## 2019-12-11 DIAGNOSIS — M2550 Pain in unspecified joint: Secondary | ICD-10-CM | POA: Diagnosis not present

## 2019-12-11 DIAGNOSIS — R2681 Unsteadiness on feet: Secondary | ICD-10-CM | POA: Diagnosis not present

## 2019-12-11 DIAGNOSIS — E1100 Type 2 diabetes mellitus with hyperosmolarity without nonketotic hyperglycemic-hyperosmolar coma (NKHHC): Secondary | ICD-10-CM | POA: Diagnosis not present

## 2019-12-12 ENCOUNTER — Other Ambulatory Visit: Payer: Self-pay

## 2019-12-12 ENCOUNTER — Ambulatory Visit: Payer: Medicaid Other | Admitting: Physical Therapy

## 2019-12-12 ENCOUNTER — Encounter: Payer: Self-pay | Admitting: Orthopaedic Surgery

## 2019-12-12 ENCOUNTER — Ambulatory Visit (INDEPENDENT_AMBULATORY_CARE_PROVIDER_SITE_OTHER): Payer: Medicaid Other | Admitting: Orthopaedic Surgery

## 2019-12-12 DIAGNOSIS — M1712 Unilateral primary osteoarthritis, left knee: Secondary | ICD-10-CM

## 2019-12-12 DIAGNOSIS — Z6841 Body Mass Index (BMI) 40.0 and over, adult: Secondary | ICD-10-CM | POA: Diagnosis not present

## 2019-12-12 MED ORDER — METHYLPREDNISOLONE ACETATE 40 MG/ML IJ SUSP
40.0000 mg | INTRAMUSCULAR | Status: AC | PRN
  Administered 2019-12-12: 40 mg via INTRA_ARTICULAR

## 2019-12-12 MED ORDER — BUPIVACAINE HCL 0.5 % IJ SOLN
2.0000 mL | INTRAMUSCULAR | Status: AC | PRN
  Administered 2019-12-12: 2 mL via INTRA_ARTICULAR

## 2019-12-12 MED ORDER — LIDOCAINE HCL 1 % IJ SOLN
2.0000 mL | INTRAMUSCULAR | Status: AC | PRN
  Administered 2019-12-12: 2 mL

## 2019-12-12 NOTE — Progress Notes (Signed)
Office Visit Note   Patient: Robin [name]           Date of Birth: Nov 08, 1955           MRN: [id number] Visit Date: 12/12/2019              Requested by: Robin Pier, MD [address] [place],  [place] [address] PCP: Robin Pier, MD   Assessment & Plan: Visit Diagnoses:  1. Primary osteoarthritis of left knee   2. Morbid obesity ([place])   3. Body mass index 50.0-59.9, adult (HCC)     Plan: Left knee injected with cortisone today.  Patient tolerated well.  She will continue to make all efforts at weight loss.  Follow-up as needed. The patient meets the AMA guidelines for Morbid (severe) obesity with a BMI > 40.0 and I have recommended weight loss.   Follow-Up Instructions: Return if symptoms worsen or fail to improve.   Orders:  Orders Placed This Encounter  Procedures  . Large Joint Inj   No orders of the defined types were placed in this encounter.     Procedures: Large Joint Inj: L knee on 12/12/2019 11:40 AM Details: 22 G needle Medications: 2 mL bupivacaine 0.5 %; 2 mL lidocaine 1 %; 40 mg methylPREDNISolone acetate 40 MG/ML Outcome: tolerated well, no immediate complications Patient was prepped and draped in the usual sterile fashion.       Clinical Data: No additional findings.   Subjective: Chief Complaint  Patient presents with  . Left Knee - Follow-up, Pain  . Left Hip - Pain, Follow-up    Robin [name] is here today for follow-up of her left knee pain.  She is requesting cortisone injection.   Review of Systems   Objective: Vital Signs: There were no vitals taken for this visit.  Physical Exam  Ortho Exam Left knee exam is unchanged. Specialty Comments:  No specialty comments available.  Imaging: No results found.   PMFS History: Patient Active Problem List   Diagnosis Date Noted  . Multilevel degenerative disc disease 10/31/2018  . Abdominal pain 10/28/2018  . Change in bowel function 10/28/2018  . Gastritis and  gastroduodenitis 04/26/2018  . Atrophic vaginitis 04/01/2018  . Diabetes mellitus ([place]) 03/31/2018  . Upper airway cough syndrome 02/08/2018  . Dysphagia 12/14/2017  . 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 Urinary incontinence 07/23/2017  . Early satiety 06/16/2017  . Hx of iron deficiency anemia 05/28/2017  . Throat pain in adult 04/05/2017  . Prediabetes 02/04/2017  . Dyspnea on exertion 12/23/2016  . Chronic allergic rhinitis 12/23/2016  . Hemoptysis 12/23/2016  . Fibromyalgia 08/18/2016  . Chronic lymphocytic thyroiditis 08/18/2016  . Depression 04/01/2016  . OSA (obstructive sleep apnea) 04/01/2016  . Essential hypertension 12/09/2015  . Hypothyroidism 12/09/2015  . Morbid obesity due to excess calories ([place]) 12/09/2015  . Constipation 05/27/2015  . Fatty liver 05/27/2015  . Abdominal pain, chronic, epigastric 07/04/2012  . Anxiety 06/09/2012  . History of gastroesophageal reflux (GERD) 06/09/2012  . Hyperlipidemia 06/09/2012  . Refusal of blood transfusions as patient is Jehovah's Witness 06/09/2012  . 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 Abnormal small bowel biopsy 09/18/2011  . Lactose intolerance 09/18/2011  . GERD 01/21/2010   Past Medical History:  Diagnosis Date  . Anxiety disorder   . Arthritis   . Asthma   . Chronic neck pain   . Constipation   . Depression   . Diabetes mellitus without complication ([place])   . Dyspnea   .  GERD (gastroesophageal reflux disease)   . Hiatal hernia    small  . Hyperlipidemia   . Hypertension   . Hypothyroidism   . Schatzki's ring    non critical  . Sleep apnea    CPAP, Sleep study at [facility]  . Sleep apnea     Family History  Problem Relation Age of Onset  . Kidney cancer Father   . Hypertension Father   . Hyperlipidemia Father   . Sleep apnea Father   . Diabetes Mother   . Hypertension Mother   . Heart Problems Mother   . Hyperlipidemia Mother   . Asthma Mother   . Sleep apnea Mother   . Liver  disease Brother   . Arthritis Brother   . Hypertension Sister   . Allergic rhinitis Sister   . Stomach cancer Other        aunt  . Breast cancer Maternal Grandmother   . Kidney disease Maternal Grandfather   . Breast cancer Paternal Grandmother   . Hypertension Brother   . Hyperlipidemia Brother   . Hypertension Brother   . Hypertension Sister   . Hyperlipidemia Sister   . Lupus Maternal Aunt   . Colon cancer Neg Hx   . Eczema Neg Hx   . Immunodeficiency Neg Hx   . Urticaria Neg Hx     Past Surgical History:  Procedure Laterality Date  . ABDOMINAL HYSTERECTOMY    . ABDOMINAL HYSTERECTOMY  02/18/2000  . CARDIAC CATHETERIZATION  08/18/2003   normal L main/LAD/L Cfx/RCA (Dr. Adora Fridge)  . COLONOSCOPY  2006   Dr. [name], hyperplastic polyps  . COLONOSCOPY  08/06/11   abnormal terminal ileum for 10cm, erosions, geographical ulceration. Bx small bowel mucosa with prominent intramucosal lymphoid aggregates, slightly inflammed  . DIRECT LARYNGOSCOPY  03/15/2012   Procedure: DIRECT LARYNGOSCOPY;  Surgeon: [name], MD;  Location: [facility];  Service: ENT;  Laterality: N/A; Dr. Wolicki--:>no foreign body seen. normal esophagus to 40cm  . ESOPHAGEAL DILATION  04/17/2015   Procedure: ESOPHAGEAL DILATION;  Surgeon: [name], MD;  [facility];  Service: Endoscopy;;  . ESOPHAGOGASTRODUODENOSCOPY  08/23/2008   [id number] distal esophageal erosions consistent with mild erosive reflux esophagitis, otherwise unremarkable esophagus/ Tiny antral erosions of doubtful clinical significance, otherwise normal stomach, patent pylorus, normal D1 and D2  . ESOPHAGOGASTRODUODENOSCOPY  08/06/11   small hh, noncritical Schatzki's ring s/p 21 F  . ESOPHAGOGASTRODUODENOSCOPY N/A 04/17/2015   [id number] s/p dilation  . ESOPHAGOGASTRODUODENOSCOPY (EGD) WITH PROPOFOL N/A 01/20/2018   Dr. [name]: Erosive gastropathy, normal-appearing esophagus status post empiric dilation.  Chronic gastritis, no H.  pylori.  . ESOPHAGOSCOPY  03/15/2012   Procedure: ESOPHAGOSCOPY;  Surgeon: [name], MD;  Location: [facility];  Service: ENT;  Laterality: N/A;  . KNEE ARTHROSCOPY  04/27/2001  . MALONEY DILATION N/A 01/20/2018   Procedure: Venia Minks DILATION;  Surgeon: [name], MD;  [facility];  Service: Endoscopy;  Laterality: N/A;  . [place]  2003   negative bruce protocol exercise tress test; EF 68%; intermediate risk study due to evidence of anterior wall ischemia extending from mid-ventricle to apex  . PITUITARY SURGERY  06/2012   benign tumor, surgeon at [facility]  . RECTOCELE REPAIR    . TRANSTHORACIC ECHOCARDIOGRAM  2003   EF normal  . VAGINAL PROLAPSE REPAIR    . VIDEO BRONCHOSCOPY Bilateral 03/08/2017   Procedure: VIDEO BRONCHOSCOPY WITHOUT FLUORO;  Surgeon: [name],  Sonia Baller, MD;  Location[facility];  Service: Cardiopulmonary;  Laterality: Bilateral;   Social History   Occupational History    Employer: UNEMPLOYED  Tobacco Use  . Smoking status: Former Smoker    Packs/day: 0.50    Years: 10.00    Pack years: 5.00    Types: Cigarettes    Start date: 07/14/1975    Quit date: 04/04/1993    Years since quitting: 26.7  . Smokeless tobacco: Never Used  . Tobacco comment: 17 yrs ago  Substance and Sexual Activity  . Alcohol use: No    Alcohol/week: 0.0 standard drinks  . Drug use: No  . Sexual activity: Not on file

## 2019-12-14 ENCOUNTER — Encounter: Payer: Self-pay | Admitting: Physical Therapy

## 2019-12-14 ENCOUNTER — Other Ambulatory Visit: Payer: Self-pay

## 2019-12-14 ENCOUNTER — Ambulatory Visit: Payer: Medicaid Other | Admitting: Physical Therapy

## 2019-12-14 DIAGNOSIS — R2681 Unsteadiness on feet: Secondary | ICD-10-CM

## 2019-12-14 DIAGNOSIS — R262 Difficulty in walking, not elsewhere classified: Secondary | ICD-10-CM

## 2019-12-14 DIAGNOSIS — M6281 Muscle weakness (generalized): Secondary | ICD-10-CM | POA: Diagnosis not present

## 2019-12-15 NOTE — Therapy (Addendum)
[facility] [address] [place], [place], [address] Phone: [phone]   Fax:  [phone]  Physical Therapy Treatment  Patient Details  Name: Robin [name] MRN: [id number] Date of Birth: September 11, 1956 Referring Provider (PT): Karle Plumber, MD   Encounter Date: 12/14/2019  PT End of Session - 12/14/19 0942    Visit Number  8    Number of Visits  12    Date for PT Re-Evaluation  01/08/20    Authorization Type  Medicaid    Authorization Time Period  1/22-2/11/21- 3 visits; Medicaid approved 8 visits from 2/19-3/18/21    Authorization - Visit Number  4    Authorization - Number of Visits  8    PT Start Time  0933    PT Stop Time  1013    PT Time Calculation (min)  40 min    Equipment Utilized During Treatment  Other (comment)   RW with transfers   Activity Tolerance  Patient limited by pain;Patient tolerated treatment well;Patient limited by fatigue    Behavior During Therapy  [facility] for tasks assessed/performed       Past Medical History:  Diagnosis Date  . Anxiety disorder   . Arthritis   . Asthma   . Chronic neck pain   . Constipation   . Depression   . Diabetes mellitus without complication ([place])   . Dyspnea   . GERD (gastroesophageal reflux disease)   . Hiatal hernia    small  . Hyperlipidemia   . Hypertension   . Hypothyroidism   . Schatzki's ring    non critical  . Sleep apnea    CPAP, Sleep study at [facility]  . Sleep apnea     Past Surgical History:  Procedure Laterality Date  . ABDOMINAL HYSTERECTOMY    . ABDOMINAL HYSTERECTOMY  02/18/2000  . CARDIAC CATHETERIZATION  08/18/2003   normal L main/LAD/L Cfx/RCA (Dr. Adora Fridge)  . COLONOSCOPY  2006   Dr. [name], hyperplastic polyps  . COLONOSCOPY  08/06/11   abnormal terminal ileum for 10cm, erosions, geographical ulceration. Bx small bowel mucosa with prominent intramucosal lymphoid aggregates, slightly inflammed  . DIRECT  LARYNGOSCOPY  03/15/2012   Procedure: DIRECT LARYNGOSCOPY;  Surgeon: [name], MD;  Location: [facility];  Service: ENT;  Laterality: N/A; Dr. Wolicki--:>no foreign body seen. normal esophagus to 40cm  . ESOPHAGEAL DILATION  04/17/2015   Procedure: ESOPHAGEAL DILATION;  Surgeon: [name], MD;  [facility];  Service: Endoscopy;;  . ESOPHAGOGASTRODUODENOSCOPY  08/23/2008   OIN:OMVE distal esophageal erosions consistent with mild erosive reflux esophagitis, otherwise unremarkable esophagus/ Tiny antral erosions of doubtful clinical significance, otherwise normal stomach, patent pylorus, normal D1 and D2  . ESOPHAGOGASTRODUODENOSCOPY  08/06/11   small hh, noncritical Schatzki's ring s/p 37 F  . ESOPHAGOGASTRODUODENOSCOPY N/A 04/17/2015   [facility]:NOBSJG s/p dilation  . ESOPHAGOGASTRODUODENOSCOPY (EGD) WITH PROPOFOL N/A 01/20/2018   Dr. [name]: Erosive gastropathy, normal-appearing esophagus status post empiric dilation.  Chronic gastritis, no H. pylori.  . ESOPHAGOSCOPY  03/15/2012   Procedure: ESOPHAGOSCOPY;  Surgeon: [name], MD;  Location: [facility];  Service: ENT;  Laterality: N/A;  . KNEE ARTHROSCOPY  04/27/2001  . MALONEY DILATION N/A 01/20/2018   Procedure: Venia Minks DILATION;  Surgeon: [name], MD;  [facility];  Service: Endoscopy;  Laterality: N/A;  . NM MYOCAR PERF WALL MOTION  2003   negative bruce protocol exercise tress test; EF 68%; intermediate  risk study due to evidence of anterior wall ischemia extending from mid-ventricle to apex  . PITUITARY SURGERY  06/2012   benign tumor, surgeon at [facility]  . RECTOCELE REPAIR    . TRANSTHORACIC ECHOCARDIOGRAM  2003   EF normal  . VAGINAL PROLAPSE REPAIR    . VIDEO BRONCHOSCOPY Bilateral 03/08/2017   Procedure: VIDEO BRONCHOSCOPY WITHOUT FLUORO;  Surgeon: Javier Glazier, MD;  Location: [facility];  Service: Cardiopulmonary;  Laterality: Bilateral;    There were no vitals filed for this visit.  Subjective  Assessment - 12/14/19 0940    Subjective  Had an injection this past Tues  in her left knee. Feeling a little better day. No falls.    Pertinent History  PMH: HTN, pre-DM, OSA, fibromyalgia, depression, obesity,  GERD, hypothyroid, OA of multiple js (hands, knees, shoulders)MRI pituitary adenoma: stable; pinched nerve in low back    Limitations  Walking;Standing    How long can you stand comfortably?  5 min    How long can you walk comfortably?  Walks if she needs to; always having pain    Patient Stated Goals  Pt's goal for therapy is to try to get around better than I am now.    Currently in Pain?  Yes    Pain Score  8     Pain Location  Leg    Pain Orientation  Right;Left    Pain Descriptors / Indicators  Aching;Tender    Pain Type  Chronic pain    Pain Onset  More than a month ago    Pain Frequency  Intermittent    Aggravating Factors   arthritis, cold weather, sitting too long, standing too long    Pain Relieving Factors  medication helps, resting, exercises help, recent shot to left knee is helping some            [facility] Adult PT Treatment/Exercise - 12/14/19 0943      Transfers   Transfers  Sit to Stand;Stand to Sit;Stand Pivot Transfers    Sit to Stand  5: Supervision;4: Min guard;With upper extremity assist;From chair/3-in-1    Stand to Sit  5: Supervision;With upper extremity assist;To chair/3-in-1    Stand Pivot Transfers  5: Supervision    Stand Pivot Transfer Details (indicate cue type and reason)  with RW from power chair to [employer]      Ambulation/Gait   Ambulation/Gait  Yes    Ambulation/Gait Assistance  4: Min guard;4: Min assist    Ambulation/Gait Assistance Details  cues for posture, step length and weight shifting.     Ambulation Distance (Feet)  30 Feet   x1   Assistive device  Rolling walker   wide RW with power chair follow   Gait Pattern  Step-through pattern;Decreased step length - right;Decreased step length - left;Decreased hip/knee flexion -  right;Decreased hip/knee flexion - left;Decreased dorsiflexion - right;Decreased dorsiflexion - left;Right flexed knee in stance;Left flexed knee in stance;Trunk flexed;Poor foot clearance - left;Poor foot clearance - right;Antalgic    Ambulation Surface  Level;Indoor      Knee/Hip Exercises: Aerobic   Nustep  level 3.0 for 5 minutes with all 4 extremities, rest for 3 minutes, then 5 more minutes of work. goal >/= 40 steps per minute each set of work for strengthening/activity tolerance. VVS with Nustep.       Knee/Hip Exercises: Standing   Other Standing Knee Exercises  standing outside parallel bars: alternating UE raises for 10 reps, alternating upper trunk rotation with reaching  behind for 10 reps each side, alternating fwd foot taps to bottom of parallel bars for 10 reps each side, then ending with mini squats for 10 reps. seated rest breaks between           PT Short Term Goals - 11/15/19 0952      PT SHORT TERM GOAL #1   Title  Pt will be independent with HEP to address strength, flexibility, balance, and gait progression.  TARGET 11/03/2019    Baseline  11/15/19: met with curent ex program    Status  Achieved    Target Date  11/03/19      PT SHORT TERM GOAL #2   Title  Pt will be ambulate at least 15 ft using RW (vs cane), min assist for improved gait within home.    Baseline  11/15/19: 20 feet with wide RW min gaurd to min assist    Time  --    Period  --    Status  Achieved    Target Date  11/03/19      PT SHORT TERM GOAL #3   Title  Pt will perform sit<>stand with minimal assistance for improved transfer efficiency and safety.    Baseline  11/15/19: met in session today    Time  --    Period  --    Status  Achieved    Target Date  11/03/19        PT Long Term Goals - 12/07/19 1902      PT LONG TERM GOAL #1   Title  Pt will be independent with progression of HEP for improved strength, flexibility, balance, and gait.  TARGET 12/21/2019 (extended date for LTGs, due to  weeks remain in [place])    Baseline  11/15/19: current HEP will need to be updated as pt progresses    Time  7    Period  Weeks    Status  On-going      PT LONG TERM GOAL #2   Title  Pt will ambulate at least 40 ft using RW with min guard assistance, for improved gait efficiency and safety in the home.    Baseline  11/15/19: 20 feet with RW min guard to min assist    Time  7    Period  Weeks    Status  On-going      PT LONG TERM GOAL #3   Title  Pt will improve sit<>stand to supervision level, for improved transfer efficiency and safety.    Baseline  11/15/19: min guard to min assist for sit<>stands    Time  7    Period  Weeks    Status  On-going      PT LONG TERM GOAL #4   Title  Pt will perform at least 10 minutes of standing activities with UE support, for improved participation in ADLs and household tasks.    Baseline  Pt reports standing no more than 5 minutes at a time, limited due to pain    Time  7    Period  Weeks    Status  On-going      PT LONG TERM GOAL #5   Title  Pt will negotiate at least 3 steps with cane and HHA for improved negotiation of stairs into and out of home.    Baseline  Uses cane, banister and assist of family member at home    Time  7    Period  Weeks    Status  On-going  Addendum to clinical impression statement, Mady Haagensen, PT: Addendum 12/29/2019:  Pt called this week to ask about more appts.  She had to cancel last scheduled appointments due to lack of transportation.  In addition, she has not made all of her approved visits, due to husband's recent health issues (with husband being primary means of transportation).  Recommend and pt in agreement with extending Medicaid date to complete 4 visits which were authorized, but pt not able to be seen due to transportation and husband's health issues.   Plan - 12/14/19 0942    Clinical Impression Statement  Today's skilled session continued to address strengthening, activity tolerance and gait  training. Pt able to tolerate increased resistance and time on Nustep with no issues. Pt also performed new standing ex's today and increased her gait distance with only fatigue reported, no increase in pain. The pt is making slow, steady progress toward goals and should benefit from continued PT to progresss toward unmet goals.    Personal Factors and Comorbidities  Comorbidity 3+    Comorbidities  HTN, pre-DM, OSA, fibromyalgia, depression, obesity,  GERD, hypothyroid, OA of multiple js (hands, knees, shoulders)MRI pituitary adenoma: stable    Examination-Activity Limitations  Stand;Stairs;Locomotion Level;Transfers    Examination-Participation Restrictions  Community Activity;Meal Prep;Other   Standing ADLs   Stability/Clinical Decision Making  Evolving/Moderate complexity    Rehab Potential  Good    PT Frequency  Other (comment)   1x/wk for 3 weeks; then 2x/wk for 4 weeks   PT Duration  Other (comment)   7 weeks total POC   PT Treatment/Interventions  ADLs/Self Care Home Management;Aquatic Therapy;Gait training;Stair training;Functional mobility training;Therapeutic activities;Therapeutic exercise;Balance training;Neuromuscular re-education;Patient/family education    PT Next Visit Plan  begin to address LTGs week of 3/15 as this will be week 7 of 7 in plan of care for recert vs discharge    PT Home Exercise Plan  Access Code: [email or web address]    Consulted and Agree with Plan of Care  Patient       Patient will benefit from skilled therapeutic intervention in order to improve the following deficits and impairments:  Abnormal gait, Decreased range of motion, Difficulty walking, Decreased endurance, Decreased activity tolerance, Pain, Decreased balance, Impaired flexibility, Decreased mobility, Decreased strength  Visit Diagnosis: Muscle weakness (generalized)  Difficulty in walking, not elsewhere classified  Unsteadiness on feet     Problem List Patient Active Problem List   Diagnosis  Date Noted  . Multilevel degenerative disc disease 10/31/2018  . Abdominal pain 10/28/2018  . Change in bowel function 10/28/2018  . Gastritis and gastroduodenitis 04/26/2018  . Atrophic vaginitis 04/01/2018  . Diabetes mellitus ([place]) 03/31/2018  . Upper airway cough syndrome 02/08/2018  . Dysphagia 12/14/2017  . 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 Urinary incontinence 07/23/2017  . Early satiety 06/16/2017  . Hx of iron deficiency anemia 05/28/2017  . Throat pain in adult 04/05/2017  . Prediabetes 02/04/2017  . Dyspnea on exertion 12/23/2016  . Chronic allergic rhinitis 12/23/2016  . Hemoptysis 12/23/2016  . Fibromyalgia 08/18/2016  . Chronic lymphocytic thyroiditis 08/18/2016  . Depression 04/01/2016  . OSA (obstructive sleep apnea) 04/01/2016  . Essential hypertension 12/09/2015  . Hypothyroidism 12/09/2015  . Morbid obesity due to excess calories ([place]) 12/09/2015  . Constipation 05/27/2015  . Fatty liver 05/27/2015  . Abdominal pain, chronic, epigastric 07/04/2012  . Anxiety 06/09/2012  . History of gastroesophageal reflux (GERD) 06/09/2012  . Hyperlipidemia 06/09/2012  . Refusal of blood transfusions as patient is [employer]  Witness 06/09/2012  . 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 Abnormal small bowel biopsy 09/18/2011  . Lactose intolerance 09/18/2011  . GERD 01/21/2010    Willow Ora, PTA, [facility] [address] [place], [place] [address] [phone] 12/15/19, 9:07 AM   Name: Robin [name] MRN: [id number] Date of Birth: November 21, 1955  Mady Haagensen, PT 12/29/19 6:58 AM Phone: [phone] Fax: [phone]

## 2019-12-16 NOTE — Procedures (Signed)
Patient Name: Robin [name], Robin [name] Date: 12/08/2019 Gender: Female D.O.B: [birth year] Age (years): 63 Referring Provider: Lorretta Harp Height (inches): 37 Interpreting Physician: Shelva Majestic MD, ABSM Weight (lbs): 355 RPSGT: Baxter Flattery BMI: 31 MRN: [id number] Neck Size: 18.00  CLINICAL INFORMATION Sleep Study Type: NPSG  Indication for sleep study: Fatigue, Obesity, Snoring, Witnesses Apnea / Gasping During Sleep  Epworth Sleepiness Score: 7  SLEEP STUDY TECHNIQUE As per the AASM Manual for the Scoring of Sleep and Associated Events v2.3 (April 2016) with a hypopnea requiring 4% desaturations.  The channels recorded and monitored were frontal, central and occipital EEG, electrooculogram (EOG), submentalis EMG (chin), nasal and oral airflow, thoracic and abdominal wall motion, anterior tibialis EMG, snore microphone, electrocardiogram, and pulse oximetry.  MEDICATIONS acetaminophen (TYLENOL) 500 MG tablet  Acetaminophen-Codeine 300-30 MG tablet  albuterol (VENTOLIN HFA) 108 (90 Base) MCG/ACT inhaler  alum hydroxide-mag trisilicate (GAVISCON) [id number] MG CHEW chewable tablet  amitriptyline (ELAVIL) 50 MG tablet  amLODipine (NORVASC) 5 MG tablet  aspirin EC 81 MG tablet  atorvastatin (LIPITOR) 20 MG tablet  benzonatate (TESSALON) 100 MG capsule  buPROPion (WELLBUTRIN XL) 300 MG 24 hr tablet  calcium carbonate (TUMS - DOSED IN MG ELEMENTAL CALCIUM) 500 MG chewable tablet  celecoxib (CELEBREX) 100 MG capsule  cetirizine (ZYRTEC) 10 MG tablet  chlorpheniramine (CHLOR-TRIMETON) 4 MG tablet  Cholecalciferol 1.25 MG (50000 UT) capsule  CINNAMON PO  dexamethasone (DECADRON) 0.5 MG tablet  DEXILANT 60 MG capsule  diclofenac sodium (VOLTAREN) 1 % GEL  DULoxetine (CYMBALTA) 30 MG capsule  ergocalciferol (VITAMIN D2) 1.25 MG (50000 UT) capsule  famotidine (PEPCID) 20 MG tablet  fexofenadine (ALLEGRA) 180 MG tablet  fluticasone (FLONASE) 50 MCG/ACT nasal spray    furosemide (LASIX) 40 MG tablet  gabapentin (NEURONTIN) 300 MG capsule  glucose blood (ONETOUCH ULTRA) test strip  glucose blood test strip  glucose blood test strip  hydrALAZINE (APRESOLINE) 50 MG tablet  levothyroxine (SYNTHROID) 88 MCG tablet  LINZESS 290 MCG CAPS capsule  metFORMIN (GLUCOPHAGE) 500 MG tablet  Metoprolol Tartrate 75 MG TABS  montelukast (SINGULAIR) 10 MG tablet  Multiple Vitamin (MULTIVITAMIN WITH MINERALS) TABS tablet  nystatin (MYCOSTATIN/NYSTOP) powder  Omega-3 Fatty Acids (FISH OIL) 1000 MG CAPS  OVER THE COUNTER MEDICATION  oxyCODONE-acetaminophen (PERCOCET) 10-325 MG tablet  polyethylene glycol-electrolytes (TRILYTE) 420 g solution  predniSONE (DELTASONE) 10 MG tablet  PREMARIN vaginal cream  sodium chloride (OCEAN) 0.65 % SOLN nasal spray  spironolactone (ALDACTONE) 25 MG tablet  vitamin B-12 (CYANOCOBALAMIN) 1000 MCG tablet   Medications self-administered by patient taken the night of the study : N/A  SLEEP ARCHITECTURE The study was initiated at 10:49:42 PM and ended at 5:19:10 AM.  Sleep onset time was 46.4 minutes and the sleep efficiency was 66.1%%. The total sleep time was 257.6 minutes.  Stage REM latency was N/A minutes.  The patient spent 9.9%% of the night in stage N1 sleep, 90.1%% in stage N2 sleep, 0.0%% in stage N3 and 0% in REM.  Alpha intrusion was absent.  Supine sleep was 46.82%.  RESPIRATORY PARAMETERS The overall apnea/hypopnea index (AHI) was 50.8 per hour. The respiraotry disturbance index (RDI) was 53.6/h.There were 7 total apneas, including 7 obstructive, 0 central and 0 mixed apneas. There were 211 hypopneas and 12 RERAs.  The AHI during Stage REM sleep was N/A per hour.  AHI while supine was 54.7 per hour.  The mean oxygen saturation was 93.5%. The minimum SpO2 during sleep was 83.0%.  Loud snoring was noted  during this study.  CARDIAC DATA The 2 lead EKG demonstrated sinus rhythm. The mean heart rate was 70.8  beats per minute. Other EKG findings include: None.  LEG MOVEMENT DATA The total PLMS were 0 with a resulting PLMS index of 0.0. Associated arousal with leg movement index was 0.5 .  IMPRESSIONS - Severe obstructive sleep apnea occurred during this study (AHI 50.8/h; RDI 53.2/h) with absence of REM sleep. - No significant central sleep apnea occurred during this study (CAI = 0.0/h). - Moderate oxygen desaturation to a nadir of 83.0%. - Abnormal sleep architecture with absence of slow wave and REM sleep. - The patient snored with loud snoring volume. - No cardiac abnormalities were noted during this study. - Clinically significant periodic limb movements did not occur during sleep. No significant associated arousals.  DIAGNOSIS - Obstructive Sleep Apnea (327.23 [G47.33 ICD-10]) - Nocturnal Hypoxemia (327.26 [G47.36 ICD-10])  RECOMMENDATIONS - Recommend expeditious therapeutic CPAP titration to determine optimal pressure required to alleviate sleep disordered breathing. - Effort should be made to optimize nasal and oropharyngeal patency. - Avoid alcohol, sedatives and other CNS depressants that may worsen sleep apnea and disrupt normal sleep architecture. - Sleep hygiene should be reviewed to assess factors that may improve sleep quality. - Weight management (BMI 54) and regular exercise should be initiated or continued if appropriate.  [Electronically signed] 12/16/2019 09:45 AM  Shelva Majestic MD, [facility], [place], American Board of Sleep Medicine   NPI: [id number] [place] PH: [phone]   FX: [phone] [place]

## 2019-12-18 ENCOUNTER — Other Ambulatory Visit: Payer: Self-pay | Admitting: Internal Medicine

## 2019-12-18 DIAGNOSIS — F54 Psychological and behavioral factors associated with disorders or diseases classified elsewhere: Secondary | ICD-10-CM | POA: Diagnosis not present

## 2019-12-18 MED FILL — DULoxetine HCL 30 MG CPEP: 30 | 90 days supply | Qty: 90 | Fill #1

## 2019-12-18 MED FILL — CETIRIZINE HCL 10 MG TABS: 10 | 30 days supply | Qty: 30 | Fill #1

## 2019-12-18 MED FILL — BUPROPION HCL ER (XL) 300 M: 300 | 60 days supply | Qty: 60 | Fill #1

## 2019-12-18 MED FILL — AMITRIPTYLINE HCL 50 MG TAB: 50 | 60 days supply | Qty: 60 | Fill #1

## 2019-12-18 MED FILL — DEXILANT DR 60 MG CAPSULE: 60 | 90 days supply | Qty: 90 | Fill #6

## 2019-12-18 MED FILL — METOPROLOL TARTRATE 75 MG T: 75 | 60 days supply | Qty: 120 | Fill #1

## 2019-12-18 MED FILL — MONTELUKAST SOD 10 MG TAB: 10 | 60 days supply | Qty: 60 | Fill #1

## 2019-12-18 MED FILL — SPIRONOLACTONE 25 MG TABLET: 25 | 90 days supply | Qty: 90 | Fill #1

## 2019-12-19 ENCOUNTER — Other Ambulatory Visit: Payer: Self-pay | Admitting: Cardiology

## 2019-12-19 ENCOUNTER — Other Ambulatory Visit: Payer: Self-pay

## 2019-12-19 ENCOUNTER — Ambulatory Visit: Payer: Medicaid Other | Admitting: Physical Therapy

## 2019-12-19 MED ORDER — GABAPENTIN 300 MG PO CAPS: 300.0000 mg | ORAL_CAPSULE | Freq: Four times a day (QID) | ORAL | 2 refills | Status: DC

## 2019-12-19 MED FILL — GABAPENTIN 300 MG CAPSULE: 300 | 30 days supply | Qty: 120 | Fill #0

## 2019-12-19 MED FILL — hydrALAZINE HCL 50 MG TABS: 50 | 30 days supply | Qty: 90 | Fill #0

## 2019-12-20 ENCOUNTER — Encounter: Payer: Medicaid Other | Admitting: Obstetrics and Gynecology

## 2019-12-21 ENCOUNTER — Ambulatory Visit: Payer: Medicaid Other | Admitting: Physical Therapy

## 2019-12-25 ENCOUNTER — Other Ambulatory Visit: Payer: Self-pay

## 2019-12-25 ENCOUNTER — Ambulatory Visit
Admission: RE | Admit: 2019-12-25 | Discharge: 2019-12-25 | Disposition: A | Discharge status: Home / Self Care | Payer: Medicaid Other | Source: Ambulatory Visit | Attending: Internal Medicine | Admitting: Internal Medicine

## 2019-12-25 DIAGNOSIS — Z03818 Encounter for observation for suspected exposure to other biological agents ruled out: Secondary | ICD-10-CM | POA: Diagnosis not present

## 2019-12-25 DIAGNOSIS — Z1231 Encounter for screening mammogram for malignant neoplasm of breast: Secondary | ICD-10-CM

## 2019-12-25 NOTE — Patient Instructions (Signed)
Robin [name]  12/25/2019     @PREFPERIOPPHARMACY @   Your procedure is scheduled on  12/28/2019 .  Report to [facility] at  [place].M.  Call this number if you have problems the morning of surgery:  [phone]   Remember:             Follow the diet and pep instructions given to you by Dr Roseanne Kaufman office.                     Take these medicines the morning of surgery with A SIP OF WATER  Amlodipine, bupropion, celebrex, dexilant, cymbalta,gabapentin, levothyroxine, metoprolol. Use your inhaler before you come. DO NOT take any medication for diabetes the morning of your procedure.    Do not wear jewelry, make-up or nail polish.  Do not wear lotions, powders, or perfumes. Please wear deodorant and brush your teeth.  Do not shave 48 hours prior to surgery.  Men may shave face and neck.  Do not bring valuables to the hospital.  [facility] is not responsible for any belongings or valuables.  Contacts, dentures or bridgework may not be worn into surgery.  Leave your suitcase in the car.  After surgery it may be brought to your room.  For patients admitted to the hospital, discharge time will be determined by your treatment team.  Patients discharged the day of surgery will not be allowed to drive home.   Name and phone number of your driver:   family Special instructions:  DO NOT smoke the morning of your procedure.  Please read over the following fact sheets that you were given. Anesthesia Post-op Instructions and Care and Recovery After Surgery       Colonoscopy, Adult, Care After This sheet gives you information about how to care for yourself after your procedure. Your health care provider may also give you more specific instructions. If you have problems or questions, contact your health care provider. What can I expect after the procedure? After the procedure, it is common to have:  A small amount of blood in your stool for 24 hours after the procedure.  Some  gas.  Mild cramping or bloating of your abdomen. Follow these instructions at home: Eating and drinking   Drink enough fluid to keep your urine pale yellow.  Follow instructions from your health care provider about eating or drinking restrictions.  Resume your normal diet as instructed by your health care provider. Avoid heavy or fried foods that are hard to digest. Activity  Rest as told by your health care provider.  Avoid sitting for a long time without moving. Get up to take short walks every 1-2 hours. This is important to improve blood flow and breathing. Ask for help if you feel weak or unsteady.  Return to your normal activities as told by your health care provider. Ask your health care provider what activities are safe for you. Managing cramping and bloating   Try walking around when you have cramps or feel bloated.  Apply heat to your abdomen as told by your health care provider. Use the heat source that your health care provider recommends, such as a moist heat pack or a heating pad. ? Place a towel between your skin and the heat source. ? Leave the heat on for 20-30 minutes. ? Remove the heat if your skin turns bright red. This is especially important if you are unable to feel pain,  heat, or cold. You may have a greater risk of getting burned. General instructions  For the first 24 hours after the procedure: ? Do not drive or use machinery. ? Do not sign important documents. ? Do not drink alcohol. ? Do your regular daily activities at a slower pace than normal. ? Eat soft foods that are easy to digest.  Take over-the-counter and prescription medicines only as told by your health care provider.  Keep all follow-up visits as told by your health care provider. This is important. Contact a health care provider if:  You have blood in your stool 2-3 days after the procedure. Get help right away if you have:  More than a small spotting of blood in your  stool.  Large blood clots in your stool.  Swelling of your abdomen.  Nausea or vomiting.  A fever.  Increasing pain in your abdomen that is not relieved with medicine. Summary  After the procedure, it is common to have a small amount of blood in your stool. You may also have mild cramping and bloating of your abdomen.  For the first 24 hours after the procedure, do not drive or use machinery, sign important documents, or drink alcohol.  Get help right away if you have a lot of blood in your stool, nausea or vomiting, a fever, or increased pain in your abdomen. This information is not intended to replace advice given to you by your health care provider. Make sure you discuss any questions you have with your health care provider. Document Revised: 04/17/2019 Document Reviewed: 04/17/2019 Elsevier Patient Education  [place] After These instructions provide you with information about caring for yourself after your procedure. Your health care provider may also give you more specific instructions. Your treatment has been planned according to current medical practices, but problems sometimes occur. Call your health care provider if you have any problems or questions after your procedure. What can I expect after the procedure? After your procedure, you may:  Feel sleepy for several hours.  Feel clumsy and have poor balance for several hours.  Feel forgetful about what happened after the procedure.  Have poor judgment for several hours.  Feel nauseous or vomit.  Have a sore throat if you had a breathing tube during the procedure. Follow these instructions at home: For at least 24 hours after the procedure:      Have a responsible adult stay with you. It is important to have someone help care for you until you are awake and alert.  Rest as needed.  Do not: ? Participate in activities in which you could fall or become  injured. ? Drive. ? Use heavy machinery. ? Drink alcohol. ? Take sleeping pills or medicines that cause drowsiness. ? Make important decisions or sign legal documents. ? Take care of children on your own. Eating and drinking  Follow the diet that is recommended by your health care provider.  If you vomit, drink water, juice, or soup when you can drink without vomiting.  Make sure you have little or no nausea before eating solid foods. General instructions  Take over-the-counter and prescription medicines only as told by your health care provider.  If you have sleep apnea, surgery and certain medicines can increase your risk for breathing problems. Follow instructions from your health care provider about wearing your sleep device: ? Anytime you are sleeping, including during daytime naps. ? While taking prescription pain medicines, sleeping medicines, or medicines  that make you drowsy.  If you smoke, do not smoke without supervision.  Keep all follow-up visits as told by your health care provider. This is important. Contact a health care provider if:  You keep feeling nauseous or you keep vomiting.  You feel light-headed.  You develop a rash.  You have a fever. Get help right away if:  You have trouble breathing. Summary  For several hours after your procedure, you may feel sleepy and have poor judgment.  Have a responsible adult stay with you for at least 24 hours or until you are awake and alert. This information is not intended to replace advice given to you by your health care provider. Make sure you discuss any questions you have with your health care provider. Document Revised: 12/20/2017 Document Reviewed: 01/12/2016 Elsevier Patient Education  [place].

## 2019-12-26 ENCOUNTER — Other Ambulatory Visit: Payer: Self-pay

## 2019-12-26 ENCOUNTER — Encounter (HOSPITAL_COMMUNITY)
Admission: RE | Admit: 2019-12-26 | Discharge: 2019-12-26 | Disposition: A | Discharge status: Home / Self Care | Payer: Medicaid Other | Source: Ambulatory Visit | Attending: Internal Medicine | Admitting: Internal Medicine

## 2019-12-26 ENCOUNTER — Encounter (HOSPITAL_COMMUNITY): Payer: Self-pay

## 2019-12-26 ENCOUNTER — Other Ambulatory Visit (HOSPITAL_COMMUNITY)
Admission: RE | Admit: 2019-12-26 | Discharge: 2019-12-26 | Disposition: A | Discharge status: Home / Self Care | Payer: Medicaid Other | Source: Ambulatory Visit | Attending: Internal Medicine | Admitting: Internal Medicine

## 2019-12-26 DIAGNOSIS — Z20822 Contact with and (suspected) exposure to covid-19: Secondary | ICD-10-CM | POA: Insufficient documentation

## 2019-12-26 DIAGNOSIS — Z01812 Encounter for preprocedural laboratory examination: Secondary | ICD-10-CM | POA: Diagnosis not present

## 2019-12-26 LAB — BASIC METABOLIC PANEL
Anion gap: 8 (ref 5–15)
BUN: 16 mg/dL (ref 8–23)
CO2: 28 mmol/L (ref 22–32)
Calcium: 9.2 mg/dL (ref 8.9–10.3)
Chloride: 101 mmol/L (ref 98–111)
Creatinine, Ser: 0.76 mg/dL (ref 0.44–1.00)
GFR calc Af Amer: >60 mL/min (ref >60)
GFR calc non Af Amer: >60 mL/min (ref >60)
Glucose, Bld: 165 mg/dL — ABNORMAL HIGH (ref 70–99)
Potassium: 4.3 mmol/L (ref 3.5–5.1)
Sodium: 137 mmol/L (ref 135–145)

## 2019-12-26 LAB — SARS CORONAVIRUS 2 (TAT 6-24 HRS): SARS Coronavirus 2: NEGATIVE

## 2019-12-27 ENCOUNTER — Ambulatory Visit: Payer: Medicaid Other | Admitting: Physical Therapy

## 2019-12-27 ENCOUNTER — Telehealth: Payer: Self-pay

## 2019-12-27 NOTE — Telephone Encounter (Signed)
Contacted pt to go over MM results pt is aware and doesn't have any questions or concerns

## 2019-12-28 ENCOUNTER — Ambulatory Visit (HOSPITAL_COMMUNITY)
Admission: RE | Admit: 2019-12-28 | Discharge: 2019-12-28 | Disposition: A | Discharge status: Home / Self Care | Payer: Medicaid Other | Attending: Internal Medicine | Admitting: Internal Medicine

## 2019-12-28 ENCOUNTER — Encounter (HOSPITAL_COMMUNITY)
Admission: RE | Disposition: A | Discharge status: Home / Self Care | Payer: Self-pay | Source: Home / Self Care | Attending: Internal Medicine

## 2019-12-28 ENCOUNTER — Ambulatory Visit (HOSPITAL_COMMUNITY): Payer: Medicaid Other | Admitting: Anesthesiology

## 2019-12-28 ENCOUNTER — Encounter (HOSPITAL_COMMUNITY): Payer: Self-pay | Admitting: Internal Medicine

## 2019-12-28 ENCOUNTER — Other Ambulatory Visit: Payer: Self-pay

## 2019-12-28 ENCOUNTER — Other Ambulatory Visit: Payer: Self-pay | Admitting: Cardiovascular Disease

## 2019-12-28 ENCOUNTER — Telehealth: Payer: Self-pay | Admitting: *Deleted

## 2019-12-28 DIAGNOSIS — Z79899 Other long term (current) drug therapy: Secondary | ICD-10-CM | POA: Diagnosis not present

## 2019-12-28 DIAGNOSIS — J45909 Unspecified asthma, uncomplicated: Secondary | ICD-10-CM | POA: Insufficient documentation

## 2019-12-28 DIAGNOSIS — E785 Hyperlipidemia, unspecified: Secondary | ICD-10-CM | POA: Insufficient documentation

## 2019-12-28 DIAGNOSIS — E119 Type 2 diabetes mellitus without complications: Secondary | ICD-10-CM | POA: Insufficient documentation

## 2019-12-28 DIAGNOSIS — F419 Anxiety disorder, unspecified: Secondary | ICD-10-CM | POA: Insufficient documentation

## 2019-12-28 DIAGNOSIS — R1030 Lower abdominal pain, unspecified: Secondary | ICD-10-CM | POA: Diagnosis not present

## 2019-12-28 DIAGNOSIS — Z791 Long term (current) use of non-steroidal anti-inflammatories (NSAID): Secondary | ICD-10-CM | POA: Diagnosis not present

## 2019-12-28 DIAGNOSIS — E039 Hypothyroidism, unspecified: Secondary | ICD-10-CM | POA: Insufficient documentation

## 2019-12-28 DIAGNOSIS — Z7982 Long term (current) use of aspirin: Secondary | ICD-10-CM | POA: Diagnosis not present

## 2019-12-28 DIAGNOSIS — K635 Polyp of colon: Secondary | ICD-10-CM | POA: Diagnosis not present

## 2019-12-28 DIAGNOSIS — Z7984 Long term (current) use of oral hypoglycemic drugs: Secondary | ICD-10-CM | POA: Diagnosis not present

## 2019-12-28 DIAGNOSIS — K5900 Constipation, unspecified: Secondary | ICD-10-CM | POA: Diagnosis not present

## 2019-12-28 DIAGNOSIS — Z79890 Hormone replacement therapy (postmenopausal): Secondary | ICD-10-CM | POA: Insufficient documentation

## 2019-12-28 DIAGNOSIS — G4733 Obstructive sleep apnea (adult) (pediatric): Secondary | ICD-10-CM

## 2019-12-28 DIAGNOSIS — D123 Benign neoplasm of transverse colon: Secondary | ICD-10-CM | POA: Insufficient documentation

## 2019-12-28 DIAGNOSIS — D122 Benign neoplasm of ascending colon: Secondary | ICD-10-CM | POA: Insufficient documentation

## 2019-12-28 DIAGNOSIS — I10 Essential (primary) hypertension: Secondary | ICD-10-CM | POA: Diagnosis not present

## 2019-12-28 DIAGNOSIS — K219 Gastro-esophageal reflux disease without esophagitis: Secondary | ICD-10-CM | POA: Insufficient documentation

## 2019-12-28 DIAGNOSIS — G8929 Other chronic pain: Secondary | ICD-10-CM | POA: Diagnosis not present

## 2019-12-28 DIAGNOSIS — M542 Cervicalgia: Secondary | ICD-10-CM | POA: Diagnosis not present

## 2019-12-28 DIAGNOSIS — F329 Major depressive disorder, single episode, unspecified: Secondary | ICD-10-CM | POA: Diagnosis not present

## 2019-12-28 DIAGNOSIS — G4730 Sleep apnea, unspecified: Secondary | ICD-10-CM | POA: Insufficient documentation

## 2019-12-28 DIAGNOSIS — Z87891 Personal history of nicotine dependence: Secondary | ICD-10-CM | POA: Diagnosis not present

## 2019-12-28 DIAGNOSIS — R194 Change in bowel habit: Secondary | ICD-10-CM | POA: Insufficient documentation

## 2019-12-28 HISTORY — PX: POLYPECTOMY: SHX5525

## 2019-12-28 HISTORY — PX: COLONOSCOPY WITH PROPOFOL: SHX5780

## 2019-12-28 LAB — GLUCOSE, CAPILLARY: Glucose-Capillary: 141 mg/dL — ABNORMAL HIGH (ref 70–99)

## 2019-12-28 SURGERY — COLONOSCOPY WITH PROPOFOL
Anesthesia: General

## 2019-12-28 MED ORDER — KETAMINE HCL 50 MG/5ML IJ SOSY
PREFILLED_SYRINGE | INTRAMUSCULAR | Status: AC
  Filled 2019-12-28: qty 5

## 2019-12-28 MED ORDER — CHLORHEXIDINE GLUCONATE CLOTH 2 % EX PADS: 6.0000 | MEDICATED_PAD | Freq: Once | CUTANEOUS | Status: DC

## 2019-12-28 MED ORDER — PROPOFOL 500 MG/50ML IV EMUL
INTRAVENOUS | Status: DC | PRN
  Administered 2019-12-28: 175 ug/kg/min via INTRAVENOUS
  Administered 2019-12-28: 100 ug/kg/min via INTRAVENOUS
  Administered 2019-12-28: 125 ug/kg/min via INTRAVENOUS

## 2019-12-28 MED ORDER — LACTATED RINGERS IV SOLN: INTRAVENOUS | Status: DC | PRN

## 2019-12-28 MED ORDER — KETAMINE HCL 10 MG/ML IJ SOLN
INTRAMUSCULAR | Status: DC | PRN
  Administered 2019-12-28: 20 mg via INTRAVENOUS
  Administered 2019-12-28: 10 mg via INTRAVENOUS

## 2019-12-28 MED ORDER — PROPOFOL 10 MG/ML IV BOLUS
INTRAVENOUS | Status: DC | PRN
  Administered 2019-12-28 (×2): 20 mg via INTRAVENOUS

## 2019-12-28 NOTE — H&P (Signed)
@LOGO @   Primary Care Physician:  Ladell Pier, MD Primary Gastroenterologist:  Dr. [name]  Pre-Procedure History & Physical: HPI:  Robin [name] is a 64 y.o. female here for colonoscopy for change in bowel habits and chronic lower abdominal pain last colonoscopy nearly 10 years ago.  Evaluation delayed for several months due to the pandemic.  Moving her bowels well with Linzess every other day.  Still has occasional "burning" left lower quadrant.  Past Medical History:  Diagnosis Date  . Anxiety disorder   . Arthritis   . Asthma   . Chronic neck pain   . Constipation   . Depression   . Diabetes mellitus without complication ([place])   . Dyspnea   . GERD (gastroesophageal reflux disease)   . Hiatal hernia    small  . Hyperlipidemia   . Hypertension   . Hypothyroidism   . Schatzki's ring    non critical  . Sleep apnea    CPAP, Sleep study at [place]  . Sleep apnea     Past Surgical History:  Procedure Laterality Date  . ABDOMINAL HYSTERECTOMY    . ABDOMINAL HYSTERECTOMY  02/18/2000  . CARDIAC CATHETERIZATION  08/18/2003   normal L main/LAD/L Cfx/RCA (Dr. Adora Fridge)  . COLONOSCOPY  2006   Dr. [name], hyperplastic polyps  . COLONOSCOPY  08/06/11   abnormal terminal ileum for 10cm, erosions, geographical ulceration. Bx small bowel mucosa with prominent intramucosal lymphoid aggregates, slightly inflammed  . DIRECT LARYNGOSCOPY  03/15/2012   Procedure: DIRECT LARYNGOSCOPY;  Surgeon: [name], MD;  Location: [place];  Service: ENT;  Laterality: N/A; Dr. Wolicki--:>no foreign body seen. normal esophagus to 40cm  . ESOPHAGEAL DILATION  04/17/2015   Procedure: ESOPHAGEAL DILATION;  Surgeon: [name], MD;  [facility];  Service: Endoscopy;;  . ESOPHAGOGASTRODUODENOSCOPY  08/23/2008   [id number] distal esophageal erosions consistent with mild erosive reflux esophagitis, otherwise unremarkable esophagus/ Tiny antral erosions of doubtful  clinical significance, otherwise normal stomach, patent pylorus, normal D1 and D2  . ESOPHAGOGASTRODUODENOSCOPY  08/06/11   small hh, noncritical Schatzki's ring s/p 42 F  . ESOPHAGOGASTRODUODENOSCOPY N/A 04/17/2015   [id number] s/p dilation  . ESOPHAGOGASTRODUODENOSCOPY (EGD) WITH PROPOFOL N/A 01/20/2018   Dr. [name]: Erosive gastropathy, normal-appearing esophagus status post empiric dilation.  Chronic gastritis, no H. pylori.  . ESOPHAGOSCOPY  03/15/2012   Procedure: ESOPHAGOSCOPY;  Surgeon: [name], MD;  Location: [place];  Service: ENT;  Laterality: N/A;  . KNEE ARTHROSCOPY  04/27/2001  . MALONEY DILATION N/A 01/20/2018   Procedure: Venia Minks DILATION;  Surgeon: [name], MD;  [facility];  Service: Endoscopy;  Laterality: N/A;  . [place]  2003   negative bruce protocol exercise tress test; EF 68%; intermediate risk study due to evidence of anterior wall ischemia extending from mid-ventricle to apex  . 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 RECTOCELE REPAIR    . TRANSTHORACIC ECHOCARDIOGRAM  2003   EF normal  . VAGINAL PROLAPSE REPAIR    . VIDEO BRONCHOSCOPY Bilateral 03/08/2017   Procedure: VIDEO BRONCHOSCOPY WITHOUT FLUORO;  Surgeon: Javier Glazier, MD;  Location: [place];  Service: Cardiopulmonary;  Laterality: Bilateral;    Prior to Admission medications   Medication Sig Start Date End Date Taking? Authorizing Provider  acetaminophen (TYLENOL) 500 MG tablet Take 1,000 mg by mouth 2 (two) times daily.    Yes [provider]  albuterol (VENTOLIN HFA) 108 (90 Base) MCG/ACT inhaler Inhale 1-2 puffs into the lungs every 6 (six) hours as needed for wheezing or shortness of breath. 11/22/19  Yes Bast, Traci A, NP  amitriptyline (ELAVIL) 50 MG tablet TAKE 1 TABLET BY MOUTH AT BEDTIME. Patient taking differently: Take 50 mg by mouth at bedtime.  11/22/19  Yes Ladell Pier, MD  amLODipine (NORVASC) 5 MG tablet  Take 1 tablet (5 mg total) by mouth daily. *NEEDS OFFICE VISIT-2ND ATTEMPT* Patient taking differently: Take 5 mg by mouth daily.  06/13/19  Yes Kilroy, Doreene Burke, PA-C  aspirin EC 81 MG tablet Take 81 mg by mouth at bedtime.   Yes [provider]  atorvastatin (LIPITOR) 20 MG tablet Take 0.5 tablets (10 mg total) by mouth daily. 08/29/19  Yes Lorretta Harp, MD  buPROPion (WELLBUTRIN XL) 300 MG 24 hr tablet TAKE 1 TABLET (300 MG TOTAL) BY MOUTH EVERY MORNING. Patient taking differently: Take 300 mg by mouth daily.  11/20/19  Yes Ladell Pier, MD  calcium carbonate (TUMS - DOSED IN MG ELEMENTAL CALCIUM) 500 MG chewable tablet Chew 2 tablets by mouth at bedtime.   Yes [provider]  celecoxib (CELEBREX) 100 MG capsule Take 100 mg by mouth 2 (two) times daily.   Yes [provider]  cetirizine (ZYRTEC) 10 MG tablet Take 1 tablet (10 mg total) by mouth daily. 11/22/19  Yes Bast, Traci A, NP  chlorpheniramine (CHLOR-TRIMETON) 4 MG tablet Take 4 mg by mouth every 4 (four) hours as needed for allergies.    Yes [provider]  Cholecalciferol 1.25 MG (50000 UT) capsule Take 50,000 Units by mouth once a week.  11/01/19 10/31/20 Yes [provider]  CINNAMON PO Take 1 capsule by mouth daily.   Yes [provider]  DEXILANT 60 MG capsule TAKE 1 CAPSULE (60 MG TOTAL) BY MOUTH DAILY. Patient taking differently: Take 60 mg by mouth daily.  06/14/19  Yes Mahala Menghini, PA-C  diclofenac sodium (VOLTAREN) 1 % GEL Apply 2-4 g topically 4 (four) times daily as needed (PAIN.).   Yes [provider]  DULoxetine (CYMBALTA) 30 MG capsule TAKE 1 CAPSULE (30 MG TOTAL) BY MOUTH DAILY. 11/20/19  Yes Ladell Pier, MD  famotidine (PEPCID) 20 MG tablet One after supper Patient taking differently: Take 20 mg by mouth every evening. One after supper 12/04/19  Yes Tanda Rockers, MD  fluticasone [facility]) 50 MCG/ACT nasal spray Place 1 spray into both nostrils  daily. 11/22/19  Yes Bast, Traci A, NP  furosemide (LASIX) 40 MG tablet TAKE 1/2 TO 1 TABLET BY MOUTH DAILY FOR LOWER EXTREMITY SWELLING Patient taking differently: Take 20-40 mg by mouth daily.  08/08/19  Yes Ladell Pier, MD  hydrALAZINE (APRESOLINE) 50 MG tablet TAKE 1 TABLET (50 MG TOTAL) BY MOUTH 3 (THREE) TIMES DAILY. Patient taking differently: Take 100 mg by mouth 3 (three) times daily.  12/19/19  Yes Kilroy, Doreene Burke, PA-C  levothyroxine (SYNTHROID) 88 MCG tablet TAKE 1 TABLET BY MOUTH DAILY. Patient taking differently: Take 88 mcg by mouth daily.  10/03/19  Yes Ladell Pier, MD  LINZESS 290 MCG CAPS capsule TAKE 1 CAPSULE BY MOUTH DAILY BEFORE BREAKFAST. Patient taking differently: Take 290 mcg by mouth daily.  10/04/19  Yes Carlis Stable, NP  metFORMIN (GLUCOPHAGE) 500 MG tablet TAKE 1/2 TABLET DAILY WITH BREAKFAST Patient taking differently: Take 250 mg by mouth daily.  12/04/19  Yes Wynetta Emery,  Dalbert Batman, MD  Metoprolol Tartrate 75 MG TABS TAKE 1 TABLET BY MOUTH TWICE DAILY. Patient taking differently: Take 75 mg by mouth in the morning and at bedtime.  11/17/19  Yes Ladell Pier, MD  montelukast (SINGULAIR) 10 MG tablet TAKE 1 TABLET BY MOUTH AT BEDTIME. Patient taking differently: Take 10 mg by mouth at bedtime.  11/20/19  Yes Ladell Pier, MD  Multiple Vitamin (MULTIVITAMIN WITH MINERALS) TABS tablet Take 1 tablet by mouth daily. Alive Women's Multivitamin   Yes [provider]  Omega-3 Fatty Acids (FISH OIL) 1000 MG CAPS Take 1,000 mg by mouth every evening.   Yes [provider]  OVER THE COUNTER MEDICATION Take 1,000 mg by mouth daily. BEET ROOT    Yes [provider]  oxyCODONE-acetaminophen (PERCOCET) 10-325 MG tablet Take 1 tablet by mouth 2 (two) times daily.    Yes [provider]  PREMARIN vaginal cream Place 1 Applicatorful vaginally at bedtime.  08/23/18  Yes [provider]  sodium chloride (OCEAN) 0.65 % SOLN  nasal spray Place 2 sprays into both nostrils every 4 (four) hours as needed for congestion.   Yes [provider]  spironolactone (ALDACTONE) 25 MG tablet Take 1 tablet (25 mg total) by mouth daily. 11/20/19  Yes Lorretta Harp, MD  vitamin B-12 (CYANOCOBALAMIN) 1000 MCG tablet Take 1,000 mcg by mouth daily.   Yes [provider]  ACCU-CHEK AVIVA PLUS test strip USE AS DIRECTED 08/08/18   Ladell Pier, MD  gabapentin (NEURONTIN) 300 MG capsule Take 1 capsule (300 mg total) by mouth 4 (four) times daily. 12/19/19   Tanda Rockers, MD  glucose blood (ONETOUCH ULTRA) test strip Use as instructed 03/17/19   Ladell Pier, MD  glucose blood test strip Accu-Chek Aviva Plus test strips    [provider]  glucose blood test strip  10/24/19   [provider]  nystatin (MYCOSTATIN/NYSTOP) powder Apply 1 application topically 3 (three) times daily. Patient not taking: Reported on 12/19/2019 10/17/19   Ladell Pier, MD  polyethylene glycol-electrolytes (TRILYTE) 420 g solution Take 4,000 mLs by mouth as directed. 11/22/18   Rawson Minix, Cristopher Estimable, MD  predniSONE (DELTASONE) 10 MG tablet Take  4 each am x 2 days,   2 each am x 2 days,  1 each am x 2 days and stop Patient not taking: Reported on 12/19/2019 12/04/19   Tanda Rockers, MD    Allergies as of 07/03/2019 - Review Complete 07/03/2019  Allergen Reaction Noted  . Celexa [citalopram hydrobromide] Other (See Comments) 05/28/2017  . Gabapentin  05/03/2018  . Norvasc [amlodipine besylate]  05/03/2018  . Diflucan [fluconazole] Rash 02/08/2018    Family History  Problem Relation Age of Onset  . Kidney cancer Father   . Hypertension Father   . Hyperlipidemia Father   . Sleep apnea Father   . Diabetes Mother   . Hypertension Mother   . Heart Problems Mother   . Hyperlipidemia Mother   . Asthma Mother   . Sleep apnea Mother   . Liver disease Brother   . Arthritis Brother   . Hypertension Sister   .  Allergic rhinitis Sister   . Stomach cancer Other        aunt  . Breast cancer Maternal Grandmother   . Kidney disease Maternal Grandfather   . Breast cancer Paternal Grandmother   . Hypertension Brother   . Hyperlipidemia Brother   . Hypertension Brother   .  Hypertension Sister   . Hyperlipidemia Sister   . Lupus Maternal Aunt   . Colon cancer Neg Hx   . Eczema Neg Hx   . Immunodeficiency Neg Hx   . Urticaria Neg Hx     Social History   Socioeconomic History  . Marital status: Married    Spouse name: Not on file  . Number of children: 4  . Years of education: 10  . Highest education level: Not on file  Occupational History    Employer: UNEMPLOYED  Tobacco Use  . Smoking status: Former Smoker    Packs/day: 0.50    Years: 10.00    Pack years: 5.00    Types: Cigarettes    Start date: 07/14/1975    Quit date: 04/04/1993    Years since quitting: 26.7  . Smokeless tobacco: Never Used  . Tobacco comment: 17 yrs ago  Substance and Sexual Activity  . Alcohol use: No    Alcohol/week: 0.0 standard drinks  . Drug use: No  . Sexual activity: Not on file  Other Topics Concern  . Not on file  Social History Narrative   Lives with husband in an apartment on the first floor.  Has 4 children.     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  Trying to get disability.  Formerly worked as a Teacher, early years/pre.  Education: 11th grade.      [facility] (12/23/16):   Originally from [facility]. She was raised in [place]. 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 No pets currently. No bird exposure. She does have mold in her current home in the bathroom, laundry room, & master bedroom.    Social Determinants of Health   Financial Resource Strain:   . Difficulty of Paying Living Expenses:   Food Insecurity:   . Worried About Sales executive in the Last Year:   . Arboriculturist in the Last Year:   Transportation Needs:   . Film/video editor (Medical):   Marland Kitchen Lack of Transportation (Non-Medical):   Physical Activity:   . Days of Exercise per Week:   . Minutes of Exercise per Session:   Stress:   . Feeling of Stress :   Social Connections:   . Frequency of Communication with Friends and Family:   . Frequency of Social Gatherings with Friends and Family:   . Attends Religious Services:   . Active Member of Clubs or Organizations:   . Attends Archivist Meetings:   Marland Kitchen Marital Status:   Intimate Partner Violence:   . Fear of Current or Ex-Partner:   . Emotionally Abused:   Marland Kitchen Physically Abused:   . Sexually Abused:     Review of Systems: See HPI, otherwise negative ROS  Physical Exam: There were no vitals taken for this visit. General:   Alert,  Well-developed, well-nourished, pleasant and cooperative in NAD Neck:  Supple; no masses or thyromegaly. No significant cervical adenopathy. Lungs:  Clear throughout to auscultation.   No wheezes, crackles, or rhonchi. No acute distress. Heart:  Regular rate and rhythm; no murmurs, clicks, rubs,  or gallops. Abdomen: Non-distended, normal bowel sounds.  Soft and nontender without appreciable mass or hepatosplenomegaly.  Pulses:  Normal pulses noted. Extremities:  Without clubbing or edema.  Impression/Plan: 64 year old lady here  for colonoscopy to evaluate change in bowel habits.  Constipation.  Better with Linzess every other day. Chronic abdominal discomfort as outlined.  I have offered the patient a colonoscopy today The risks, benefits, limitations, alternatives and imponderables have been reviewed with the patient. Questions have been answered. All parties are agreeable.      Notice: This dictation was prepared with Dragon dictation along with smaller phrase technology. Any transcriptional errors that result from this process are unintentional and may  not be corrected upon review.

## 2019-12-28 NOTE — Anesthesia Postprocedure Evaluation (Signed)
Anesthesia Post Note  Patient: Robin [name]  Procedure(s) Performed: COLONOSCOPY WITH PROPOFOL (N/A ) POLYPECTOMY  Patient location during evaluation: PACU Anesthesia Type: General Level of consciousness: awake and alert and patient cooperative Pain management: satisfactory to patient Vital Signs Assessment: post-procedure vital signs reviewed and stable Respiratory status: spontaneous breathing Cardiovascular status: stable Postop Assessment: no apparent nausea or vomiting Anesthetic complications: no     Last Vitals:  Vitals:   12/28/19 1045 12/28/19 1049  BP: 127/78 (!) 132/54  Pulse:  70  Resp: 16 18  Temp:  36.9 C  SpO2: 97% 94%    Last Pain:  Vitals:   12/28/19 1049  TempSrc: Oral  PainSc: 7                  Manus Weedman

## 2019-12-28 NOTE — Op Note (Signed)
[facility] Patient Name: Robin [name] Procedure Date: 12/28/2019 9:38 AM MRN: [id number] Date of Birth: 1956-04-12 Attending MD: Norvel Richards , MD CSN: [id number] Age: 64 Admit Type: Outpatient Procedure:                Colonoscopy Indications:              Change in bowel habits Providers:                Norvel Richards, MD, Jeanann Lewandowsky. Sharon Seller, RN,                            Nelma Rothman, Technician Referring MD:              Medicines:                Propofol per Anesthesia Complications:            No immediate complications. Estimated Blood Loss:     Estimated blood loss was minimal. Procedure:                After obtaining informed consent, the colonoscope                            was passed under direct vision. Throughout the                            procedure, the patient's blood pressure, pulse, and                            oxygen saturations were monitored continuously. The                            [id number] scope was introduced through                            the anus and advanced to the the cecum, identified                            by appendiceal orifice and ileocecal valve. Scope In: 10:01:32 AM Scope Out: 10:21:01 AM Scope Withdrawal Time: 0 hours 13 minutes 21 seconds  Total Procedure Duration: 0 hours 19 minutes 29 seconds  Findings:      The perianal and digital rectal examinations were normal.      Four sessile polyps were found in the splenic flexure and hepatic       flexure. The polyps were 4 to 9 mm in size. These polyps were removed       with a cold snare. Resection and retrieval were complete. Estimated       blood loss was minimal.      The exam was otherwise without abnormality on direct and retroflexion       views. Impression:               - Four 4 to 9 mm polyps at the splenic flexure and                            at the hepatic flexure, removed with a cold snare.  Resected and  retrieved.                           - The examination was otherwise normal on direct                            and retroflexion views. Moderate Sedation:      Moderate (conscious) sedation was personally administered by an       anesthesia professional. The following parameters were monitored: oxygen       saturation, heart rate, blood pressure, respiratory rate, EKG, adequacy       of pulmonary ventilation, and response to care. Recommendation:           - Patient has a contact number available for                            emergencies. The signs and symptoms of potential                            delayed complications were discussed with the                            patient. Return to normal activities tomorrow.                            Written discharge instructions were provided to the                            patient.                           - Resume previous diet.                           - Continue present medications.                           - Await pathology results.                           - Repeat colonoscopy date to be determined after                            pending pathology results are reviewed for                            surveillance based on pathology results.                           - Return to GI office in 3 months. Continue Linzess                            every other day for constipation. Add Benefiber 1                            tablespoon daily for 3 weeks; then increase  to 2                            tablespoons daily thereafter Procedure Code(s):        --- Professional ---                           [phone], Colonoscopy, flexible; with removal of                            tumor(s), polyp(s), or other lesion(s) by snare                            technique Diagnosis Code(s):        --- Professional ---                           K63.5, Polyp of colon                           R19.4, Change in bowel habit CPT copyright 2019 American  Medical Association. All rights reserved. The codes documented in this report are preliminary and upon coder review may  be revised to meet current compliance requirements. Cristopher Estimable. Jamyra Zweig, MD Norvel Richards, MD 12/28/2019 10:27:00 AM This report has been signed electronically. Number of Addenda: 0

## 2019-12-28 NOTE — Anesthesia Preprocedure Evaluation (Addendum)
Anesthesia Evaluation  Patient identified by MRN, date of birth, ID band Patient awake    Reviewed: Allergy & Precautions, H&P , NPO status , Patient's Chart, lab work & pertinent test results, reviewed documented beta blocker date and time   Airway Mallampati: III  TM Distance: >3 FB Neck ROM: full    Dental no notable dental hx.    Pulmonary shortness of breath, asthma , sleep apnea , former smoker,    Pulmonary exam normal        Cardiovascular hypertension, Normal cardiovascular exam     Neuro/Psych PSYCHIATRIC DISORDERS Anxiety Depression  Neuromuscular disease    GI/Hepatic hiatal hernia, GERD  Medicated,  Endo/Other  diabetes, Type 2Hypothyroidism   Renal/GU      Musculoskeletal   Abdominal   Peds  Hematology negative hematology ROS (+)   Anesthesia Other Findings   Reproductive/Obstetrics negative OB ROS                             Anesthesia Physical Anesthesia Plan  ASA: III  Anesthesia Plan: General   Post-op Pain Management:    Induction:   PONV Risk Score and Plan: Propofol infusion  Airway Management Planned:   Additional Equipment:   Intra-op Plan:   Post-operative Plan:   Informed Consent: I have reviewed the patients History and Physical, chart, labs and discussed the procedure including the risks, benefits and alternatives for the proposed anesthesia with the patient or authorized representative who has indicated his/her understanding and acceptance.       Plan Discussed with:   Anesthesia Plan Comments:        Anesthesia Quick Evaluation

## 2019-12-28 NOTE — Anesthesia Procedure Notes (Signed)
Date/Time: 12/28/2019 9:51 AM Performed by: Vista Deck, CRNA Pre-anesthesia Checklist: Patient identified, Emergency Drugs available, Suction available, Timeout performed and Patient being monitored Patient Re-evaluated:Patient Re-evaluated prior to induction Oxygen Delivery Method: Nasal Cannula

## 2019-12-28 NOTE — Discharge Instructions (Signed)
Colonoscopy Discharge Instructions  Read the instructions outlined below and refer to this sheet in the next few weeks. These discharge instructions provide you with general information on caring for yourself after you leave the hospital. Your doctor may also give you specific instructions. While your treatment has been planned according to the most current medical practices available, unavoidable complications occasionally occur. If you have any problems or questions after discharge, call Dr. [name] at [phone]. ACTIVITY  You may resume your regular activity, but move at a slower pace for the next 24 hours.   Take frequent rest periods for the next 24 hours.   Walking will help get rid of the air and reduce the bloated feeling in your belly (abdomen).   No driving for 24 hours (because of the medicine (anesthesia) used during the test).    Do not sign any important legal documents or operate any machinery for 24 hours (because of the anesthesia used during the test).  NUTRITION  Drink plenty of fluids.   You may resume your normal diet as instructed by your doctor.   Begin with a light meal and progress to your normal diet. Heavy or fried foods are harder to digest and may make you feel sick to your stomach (nauseated).   Avoid alcoholic beverages for 24 hours or as instructed.  MEDICATIONS  You may resume your normal medications unless your doctor tells you otherwise.  WHAT YOU CAN EXPECT TODAY  Some feelings of bloating in the abdomen.   Passage of more gas than usual.   Spotting of blood in your stool or on the toilet paper.  IF YOU HAD POLYPS REMOVED DURING THE COLONOSCOPY:  No aspirin products for 7 days or as instructed.   No alcohol for 7 days or as instructed.   Eat a soft diet for the next 24 hours.  FINDING OUT THE RESULTS OF YOUR TEST Not all test results are available during your visit. If your test results are not back during the visit, make an appointment  with your caregiver to find out the results. Do not assume everything is normal if you have not heard from your caregiver or the medical facility. It is important for you to follow up on all of your test results.  SEEK IMMEDIATE MEDICAL ATTENTION IF:  You have more than a spotting of blood in your stool.   Your belly is swollen (abdominal distention).   You are nauseated or vomiting.   You have a temperature over 101.   You have abdominal pain or discomfort that is severe or gets worse throughout the day.   Colon polyp and constipation information provided  Further recommendations to follow pending review of pathology report  Continue Linzess every day to every other day  Begin Benefiber 1 tablespoon daily for 3 weeks; then increased to 2 tablespoons daily thereafter.  At patient request, I called Breina Geer at [phone] and reviewed results.  First visit with [name] in 3 months     Colon Polyps  Polyps are tissue growths inside the body. Polyps can grow in many places, including the large intestine (colon). A polyp may be a round bump or a mushroom-shaped growth. You could have one polyp or several. Most colon polyps are noncancerous (benign). However, some colon polyps can become cancerous over time. Finding and removing the polyps early can help prevent this. What are the causes? The exact cause of colon polyps is not known. What increases the risk? You are more likely  to develop this condition if you:  Have a family history of colon cancer or colon polyps.  Are older than 64 or older than 45 if you are African American.  Have inflammatory bowel disease, such as ulcerative colitis or Crohn's disease.  Have certain hereditary conditions, such as: ? Familial adenomatous polyposis. ? Lynch syndrome. ? Turcot syndrome. ? Peutz-Jeghers syndrome.  Are overweight.  Smoke cigarettes.  Do not get enough exercise.  Drink too much alcohol.  Eat a diet that is high in  fat and red meat and low in fiber.  Had childhood cancer that was treated with abdominal radiation. What are the signs or symptoms? Most polyps do not cause symptoms. If you have symptoms, they may include:  Blood coming from your rectum when having a bowel movement.  Blood in your stool. The stool may look dark red or black.  Abdominal pain.  A change in bowel habits, such as constipation or diarrhea. How is this diagnosed? This condition is diagnosed with a colonoscopy. This is a procedure in which a lighted, flexible scope is inserted into the anus and then passed into the colon to examine the area. Polyps are sometimes found when a colonoscopy is done as part of routine cancer screening tests. How is this treated? Treatment for this condition involves removing any polyps that are found. Most polyps can be removed during a colonoscopy. Those polyps will then be tested for cancer. Additional treatment may be needed depending on the results of testing. Follow these instructions at home: Lifestyle  Maintain a healthy weight, or lose weight if recommended by your health care provider.  Exercise every day or as told by your health care provider.  Do not use any products that contain nicotine or tobacco, such as cigarettes and e-cigarettes. If you need help quitting, ask your health care provider.  If you drink alcohol, limit how much you have: ? 0-1 drink a day for women. ? 0-2 drinks a day for men.  Be aware of how much alcohol is in your drink. In the [place] one drink equals one 12 oz bottle of beer (355 mL), one 5 oz glass of wine (148 mL), or one 1 oz shot of hard liquor (44 mL). Eating and drinking   Eat foods that are high in fiber, such as fruits, vegetables, and whole grains.  Eat foods that are high in calcium and vitamin D, such as milk, cheese, yogurt, eggs, liver, fish, and broccoli.  Limit foods that are high in fat, such as fried foods and desserts.  Limit the  amount of red meat and processed meat you eat, such as hot dogs, sausage, bacon, and lunch meats. General instructions  Keep all follow-up visits as told by your health care provider. This is important. ? This includes having regularly scheduled colonoscopies. ? Talk to your health care provider about when you need a colonoscopy. Contact a health care provider if:  You have new or worsening bleeding during a bowel movement.  You have new or increased blood in your stool.  You have a change in bowel habits.  You lose weight for no known reason. Summary  Polyps are tissue growths inside the body. Polyps can grow in many places, including the colon.  Most colon polyps are noncancerous (benign), but some can become cancerous over time.  This condition is diagnosed with a colonoscopy.  Treatment for this condition involves removing any polyps that are found. Most polyps can be removed during a colonoscopy.  This information is not intended to replace advice given to you by your health care provider. Make sure you discuss any questions you have with your health care provider. Document Revised: 01/06/2018 Document Reviewed: 01/06/2018 Elsevier Patient Education  [place].     Constipation, Adult Constipation is when a person has fewer bowel movements in a week than normal, has difficulty having a bowel movement, or has stools that are dry, hard, or larger than normal. Constipation may be caused by an underlying condition. It may become worse with age if a person takes certain medicines and does not take in enough fluids. Follow these instructions at home: Eating and drinking   Eat foods that have a lot of fiber, such as fresh fruits and vegetables, whole grains, and beans.  Limit foods that are high in fat, low in fiber, or overly processed, such as french fries, hamburgers, cookies, candies, and soda.  Drink enough fluid to keep your urine clear or pale yellow. General  instructions  Exercise regularly or as told by your health care provider.  Go to the restroom when you have the urge to go. Do not hold it in.  Take over-the-counter and prescription medicines only as told by your health care provider. These include any fiber supplements.  Practice pelvic floor retraining exercises, such as deep breathing while relaxing the lower abdomen and pelvic floor relaxation during bowel movements.  Watch your condition for any changes.  Keep all follow-up visits as told by your health care provider. This is important. Contact a health care provider if:  You have pain that gets worse.  You have a fever.  You do not have a bowel movement after 4 days.  You vomit.  You are not hungry.  You lose weight.  You are bleeding from the anus.  You have thin, pencil-like stools. Get help right away if:  You have a fever and your symptoms suddenly get worse.  You leak stool or have blood in your stool.  Your abdomen is bloated.  You have severe pain in your abdomen.  You feel dizzy or you faint. This information is not intended to replace advice given to you by your health care provider. Make sure you discuss any questions you have with your health care provider. Document Revised: 09/03/2017 Document Reviewed: 03/11/2016 Elsevier Patient Education  2020 [place] After These instructions provide you with information about caring for yourself after your procedure. Your health care provider may also give you more specific instructions. Your treatment has been planned according to current medical practices, but problems sometimes occur. Call your health care provider if you have any problems or questions after your procedure. What can I expect after the procedure? After your procedure, you may:  Feel sleepy for several hours.  Feel clumsy and have poor balance for several hours.  Feel forgetful about what  happened after the procedure.  Have poor judgment for several hours.  Feel nauseous or vomit.  Have a sore throat if you had a breathing tube during the procedure. Follow these instructions at home: For at least 24 hours after the procedure:      Have a responsible adult stay with you. It is important to have someone help care for you until you are awake and alert.  Rest as needed.  Do not: ? Participate in activities in which you could fall or become injured. ? Drive. ? Use heavy machinery. ? Drink alcohol. ?  Take sleeping pills or medicines that cause drowsiness. ? Make important decisions or sign legal documents. ? Take care of children on your own. Eating and drinking  Follow the diet that is recommended by your health care provider.  If you vomit, drink water, juice, or soup when you can drink without vomiting.  Make sure you have little or no nausea before eating solid foods. General instructions  Take over-the-counter and prescription medicines only as told by your health care provider.  If you have sleep apnea, surgery and certain medicines can increase your risk for breathing problems. Follow instructions from your health care provider about wearing your sleep device: ? Anytime you are sleeping, including during daytime naps. ? While taking prescription pain medicines, sleeping medicines, or medicines that make you drowsy.  If you smoke, do not smoke without supervision.  Keep all follow-up visits as told by your health care provider. This is important. Contact a health care provider if:  You keep feeling nauseous or you keep vomiting.  You feel light-headed.  You develop a rash.  You have a fever. Get help right away if:  You have trouble breathing. Summary  For several hours after your procedure, you may feel sleepy and have poor judgment.  Have a responsible adult stay with you for at least 24 hours or until you are awake and alert. This  information is not intended to replace advice given to you by your health care provider. Make sure you discuss any questions you have with your health care provider. Document Revised: 12/20/2017 Document Reviewed: 01/12/2016 Elsevier Patient Education  [place].

## 2019-12-28 NOTE — Transfer of Care (Signed)
2Immediate Anesthesia Transfer of Care Note  Patient: Robin [name]  Procedure(s) Performed: COLONOSCOPY WITH PROPOFOL (N/A ) POLYPECTOMY  Patient Location: PACU  Anesthesia Type:General  Level of Consciousness: awake and patient cooperative  Airway & Oxygen Therapy: Patient Spontanous Breathing  Post-op Assessment: Report given to RN and Post -op Vital signs reviewed and stable  Post vital signs: Reviewed and stable  Last Vitals:  Vitals Value Taken Time  BP    Temp    Pulse    Resp    SpO2      Last Pain:  Vitals:   12/28/19 0957  TempSrc:   PainSc: 8       Patients Stated Pain Goal: 7 ([id number])  Complications: No apparent anesthesia complications  SEE PACU FLOW SHEET FOR VITAL SIGNS

## 2019-12-28 NOTE — Telephone Encounter (Signed)
Patient notified of sleep study results and recommendations. She agrees to proceed with CPAP titration scheduled on 01/20/20. It was also requested for the patient to be placed on the sleep labs cancellation list, as Dr Claiborne Billings requested this to be done expeditiously.

## 2019-12-29 ENCOUNTER — Telehealth: Payer: Self-pay | Admitting: Internal Medicine

## 2019-12-29 ENCOUNTER — Ambulatory Visit: Payer: Medicaid Other | Admitting: Physical Therapy

## 2019-12-29 LAB — SURGICAL PATHOLOGY

## 2019-12-29 MED FILL — ATORVASTATIN CALCIUM 20 MG: 20 | 30 days supply | Qty: 15 | Fill #4

## 2019-12-29 MED FILL — metFORMIN HCL 500 MG TABS: 500 | 30 days supply | Qty: 15 | Fill #1

## 2019-12-29 MED FILL — FAMOTIDINE 20 MG TABS: 20 | 30 days supply | Qty: 30 | Fill #1

## 2019-12-29 MED FILL — CELECOXIB 100 MG CAP: 100 | 30 days supply | Qty: 60 | Fill #0

## 2019-12-29 NOTE — Telephone Encounter (Signed)
Pt had colonoscopy yesterday and has rectal bleeding now. Please advise. [phone]

## 2019-12-29 NOTE — Telephone Encounter (Signed)
Had 4 small polyps cold snared.  Blood she sees will taper off polyp sites bled a little bit.  This is just blood coming through from removal yesterday.  Should be fine.  Please let her know.

## 2019-12-29 NOTE — Telephone Encounter (Signed)
Noted. Spoke with pt and she is aware of RMRs recommendations and will follow bleeding.

## 2019-12-29 NOTE — Telephone Encounter (Signed)
Spoke with pt. Pt had TCS yesterday and is having some rectal bleeding. Pt hasn't had a BM. Pt thought she was having one but when pt wiped, she saw blood on the tissue and a clot fell in the toilet. Pt feels it was about a 1/2 or 1 teaspoon. Pt has a little abdominal discomfort and feels like she doesn't have any energy. Pt hasn't eaten anything this morning and plans to eat in a few mins. Pain level is at a 6 right now. Last night pt passed a little gas and hasn't passed any gas this morning.

## 2019-12-30 ENCOUNTER — Encounter: Payer: Self-pay | Admitting: Internal Medicine

## 2020-01-02 DIAGNOSIS — M539 Dorsopathy, unspecified: Secondary | ICD-10-CM | POA: Diagnosis not present

## 2020-01-02 DIAGNOSIS — Z6841 Body Mass Index (BMI) 40.0 and over, adult: Secondary | ICD-10-CM | POA: Diagnosis not present

## 2020-01-02 DIAGNOSIS — M8949 Other hypertrophic osteoarthropathy, multiple sites: Secondary | ICD-10-CM | POA: Diagnosis not present

## 2020-01-02 DIAGNOSIS — Z791 Long term (current) use of non-steroidal anti-inflammatories (NSAID): Secondary | ICD-10-CM | POA: Diagnosis not present

## 2020-01-04 DIAGNOSIS — F331 Major depressive disorder, recurrent, moderate: Secondary | ICD-10-CM | POA: Diagnosis not present

## 2020-01-08 ENCOUNTER — Ambulatory Visit: Payer: Medicaid Other | Admitting: Physical Therapy

## 2020-01-08 ENCOUNTER — Other Ambulatory Visit: Payer: Self-pay | Admitting: Internal Medicine

## 2020-01-08 ENCOUNTER — Ambulatory Visit: Payer: Medicaid Other | Admitting: Internal Medicine

## 2020-01-08 MED FILL — LINZESS 290 MCG CAPSULE: 290 | 30 days supply | Qty: 30 | Fill #2

## 2020-01-09 MED FILL — LEVOTHYROXINE 88 MCG TABLET: 88 | 30 days supply | Qty: 30 | Fill #0

## 2020-01-10 ENCOUNTER — Ambulatory Visit: Payer: Medicaid Other | Admitting: Physical Therapy

## 2020-01-10 ENCOUNTER — Ambulatory Visit: Payer: Medicaid Other | Attending: Internal Medicine | Admitting: Physical Therapy

## 2020-01-10 ENCOUNTER — Encounter: Payer: Self-pay | Admitting: Physical Therapy

## 2020-01-10 ENCOUNTER — Other Ambulatory Visit: Payer: Self-pay

## 2020-01-10 DIAGNOSIS — R262 Difficulty in walking, not elsewhere classified: Secondary | ICD-10-CM

## 2020-01-10 DIAGNOSIS — M6281 Muscle weakness (generalized): Secondary | ICD-10-CM | POA: Diagnosis not present

## 2020-01-10 DIAGNOSIS — R2681 Unsteadiness on feet: Secondary | ICD-10-CM

## 2020-01-10 NOTE — Therapy (Signed)
[facility] [address] [place], [place], [address] Phone: [phone]   Fax:  [phone]  Physical Therapy Treatment  Patient Details  Name: Robin [name] MRN: [id number] Date of Birth: [birth year] Referring Provider (PT): Karle Plumber, MD   Encounter Date: 01/10/2020  PT End of Session - 01/10/20 1902    Visit Number  9    Number of Visits  12    Date for PT Re-Evaluation  02/06/20    Authorization Type  Medicaid    Authorization Time Period  4 visits 01/10/20-02/06/2020 (this was based on request to extend date for 4 visits not previously used)    Authorization - Visit Number  1    Authorization - Number of Visits  4    PT Start Time  1621    PT Stop Time  1702    PT Time Calculation (min)  41 min    Equipment Utilized During Treatment  Other (comment)   RW with transfers   Activity Tolerance  Patient limited by pain;Patient tolerated treatment well;Patient limited by fatigue    Behavior During Therapy  [facility] for tasks assessed/performed       Past Medical History:  Diagnosis Date  . Anxiety disorder   . Arthritis   . Asthma   . Chronic neck pain   . Constipation   . Depression   . Diabetes mellitus without complication ([place])   . Dyspnea   . GERD (gastroesophageal reflux disease)   . Hiatal hernia    small  . Hyperlipidemia   . Hypertension   . Hypothyroidism   . Schatzki's ring    non critical  . Sleep apnea    CPAP, Sleep study at [place]  . Sleep apnea     Past Surgical History:  Procedure Laterality Date  . ABDOMINAL HYSTERECTOMY    . ABDOMINAL HYSTERECTOMY  02/18/2000  . CARDIAC CATHETERIZATION  08/18/2003   normal L main/LAD/L Cfx/RCA (Dr. Adora Fridge)  . COLONOSCOPY  2006   Dr. [name], hyperplastic polyps  . COLONOSCOPY  08/06/11   abnormal terminal ileum for 10cm, erosions, geographical ulceration. Bx small bowel mucosa with prominent intramucosal lymphoid  aggregates, slightly inflammed  . COLONOSCOPY WITH PROPOFOL N/A 12/28/2019   Procedure: COLONOSCOPY WITH PROPOFOL;  Surgeon: [name], MD;  [facility];  Service: Endoscopy;  Laterality: N/A;  8:30am  . DIRECT LARYNGOSCOPY  03/15/2012   Procedure: DIRECT LARYNGOSCOPY;  Surgeon: [name], MD;  Location: [facility];  Service: ENT;  Laterality: N/A; Dr. Wolicki--:>no foreign body seen. normal esophagus to 40cm  . ESOPHAGEAL DILATION  04/17/2015   Procedure: ESOPHAGEAL DILATION;  Surgeon: [name], MD;  [facility];  Service: Endoscopy;;  . ESOPHAGOGASTRODUODENOSCOPY  08/23/2008   ONG:EXBM distal esophageal erosions consistent with mild erosive reflux esophagitis, otherwise unremarkable esophagus/ Tiny antral erosions of doubtful clinical significance, otherwise normal stomach, patent pylorus, normal D1 and D2  . ESOPHAGOGASTRODUODENOSCOPY  08/06/11   small hh, noncritical Schatzki's ring s/p 83 F  . ESOPHAGOGASTRODUODENOSCOPY N/A 04/17/2015   WUX:LKGMWN s/p dilation  . ESOPHAGOGASTRODUODENOSCOPY (EGD) WITH PROPOFOL N/A 01/20/2018   Dr. [name]: Erosive gastropathy, normal-appearing esophagus status post empiric dilation.  Chronic gastritis, no H. pylori.  . ESOPHAGOSCOPY  03/15/2012   Procedure: ESOPHAGOSCOPY;  Surgeon: [name], MD;  Location: [facility];  Service: ENT;  Laterality: N/A;  . KNEE ARTHROSCOPY  04/27/2001  . MALONEY DILATION N/A 01/20/2018  Procedure: MALONEY DILATION;  Surgeon: [name], MD;  [facility];  Service: Endoscopy;  Laterality: N/A;  . [place]  2003   negative bruce protocol exercise tress test; EF 68%; intermediate risk study due to evidence of anterior wall ischemia extending from mid-ventricle to apex  . PITUITARY SURGERY  06/2012   benign tumor, surgeon at [facility]  . POLYPECTOMY  12/28/2019   Procedure: POLYPECTOMY;  Surgeon: [name], MD;  [facility];  Service: Endoscopy;;  .  RECTOCELE REPAIR    . TRANSTHORACIC ECHOCARDIOGRAM  2003   EF normal  . VAGINAL PROLAPSE REPAIR    . VIDEO BRONCHOSCOPY Bilateral 03/08/2017   Procedure: VIDEO BRONCHOSCOPY WITHOUT FLUORO;  Surgeon: Javier Glazier, MD;  Location: [facility];  Service: Cardiopulmonary;  Laterality: Bilateral;    There were no vitals filed for this visit.  Subjective Assessment - 01/10/20 1624    Subjective  I have missed being here.  But I think I've got things set to be back.    Pertinent History  PMH: HTN, pre-DM, OSA, fibromyalgia, depression, obesity,  GERD, hypothyroid, OA of multiple js (hands, knees, shoulders)MRI pituitary adenoma: stable; pinched nerve in low back    Limitations  Walking;Standing    How long can you stand comfortably?  5 min    How long can you walk comfortably?  Walks if she needs to; always having pain    Patient Stated Goals  Pt's goal for therapy is to try to get around better than I am now.    Currently in Pain?  Yes    Pain Score  8     Pain Location  Leg    Pain Orientation  Left    Pain Descriptors / Indicators  Aching    Pain Type  Chronic pain    Pain Onset  More than a month ago    Pain Frequency  Intermittent    Aggravating Factors   staying in one place too long    Pain Relieving Factors  medication helps, resting exercise help               Therapeutic Exercise:  Reviewed HEP:   Seated Heel Toe Raises -performed x 10 reps Seated Long Arc Quad - performed x 5 reps Seated Hamstring Curl with Anchored Resistance - 10 reps-verbally reviewed Seated March - performed x 5 reps Seated Hip Abduction with Resistance - 10 reps -verbally reviewed  Seated Hamstring Stretch - performed 2 reps x 30 seconds each leg Seated Hip Adduction Squeeze with Ball - 10 reps - verbally reviewed   Added 11/30/2019 Seated Gluteal Sets - 10 reps - 1-2 sets - 1x daily - 5x weekly Seated Scapular Retraction - 10 reps - 1-2 sets - 1x daily - 5x weekly (additional written  instruction for cross body punches x 5 reps, alternating UE lifts x 5 reps)  Pt return demo understanding and verbalizes understanding of HEP.        [facility] Adult PT Treatment/Exercise - 01/10/20 1656      Transfers   Transfers  Sit to Stand;Stand to Sit    Sit to Stand  5: Supervision;With upper extremity assist;From chair/3-in-1;From bed    Stand to Sit  5: Supervision;With upper extremity assist;To chair/3-in-1;To bed    Comments  Performed x 5 reps throughout session.        Ambulation/Gait   Ambulation/Gait  Yes    Ambulation/Gait Assistance  4: Min  guard    Ambulation/Gait Assistance Details  cues for posture    Ambulation Distance (Feet)  40 Feet   18 ft   Assistive device  Rolling walker    Gait Pattern  Step-through pattern;Decreased step length - right;Decreased step length - left;Decreased hip/knee flexion - right;Decreased hip/knee flexion - left;Decreased dorsiflexion - right;Decreased dorsiflexion - left;Right flexed knee in stance;Left flexed knee in stance;Trunk flexed;Poor foot clearance - left;Poor foot clearance - right;Antalgic    Ambulation Surface  Level;Indoor    Gait Comments  Step up/up, down/down on 1st step, using R rail and SBQC in LUE, with min/mod assist      High Level Balance   High Level Balance Comments  Standing at counter x 2 minutes, with BUE support, with lateral weightshifting x 10 reps, stagger stance forward/back weigthshifting x 10 reps, each foot position.             PT Education - 01/10/20 1902    Education Details  Progress towards goals, POC    Person(s) Educated  Patient    Methods  Explanation    Comprehension  Verbalized understanding       PT Short Term Goals - 11/15/19 6962      PT SHORT TERM GOAL #1   Title  Pt will be independent with HEP to address strength, flexibility, balance, and gait progression.  TARGET 11/03/2019    Baseline  11/15/19: met with curent ex program    Status  Achieved    Target Date  11/03/19       PT SHORT TERM GOAL #2   Title  Pt will be ambulate at least 15 ft using RW (vs cane), min assist for improved gait within home.    Baseline  11/15/19: 20 feet with wide RW min gaurd to min assist    Time  --    Period  --    Status  Achieved    Target Date  11/03/19      PT SHORT TERM GOAL #3   Title  Pt will perform sit<>stand with minimal assistance for improved transfer efficiency and safety.    Baseline  11/15/19: met in session today    Time  --    Period  --    Status  Achieved    Target Date  11/03/19        PT Long Term Goals - 01/10/20 1638      PT LONG TERM GOAL #1   Title  Pt will be independent with progression of HEP for improved strength, flexibility, balance, and gait.  TARGET 12/21/2019 (extended date for LTGs, due to weeks remain in [place])    Baseline  independent with current HEP 01/10/2020    Time  7    Period  Weeks    Status  Achieved      PT LONG TERM GOAL #2   Title  Pt will ambulate at least 40 ft using RW with min guard assistance, for improved gait efficiency and safety in the home.    Baseline  40 ft 01/10/2020 with min guard and RW    Time  7    Period  Weeks    Status  Achieved      PT LONG TERM GOAL #3   Title  Pt will improve sit<>stand to supervision level, for improved transfer efficiency and safety.    Baseline  supervision sit<>stand    Time  7    Period  Weeks    Status  Achieved      PT LONG TERM GOAL #4   Title  Pt will perform at least 10 minutes of standing activities with UE support, for improved participation in ADLs and household tasks.    Baseline  In clinic during PT session, 2-4 minutes of standing/gait, limited due to pain    Time  7    Period  Weeks    Status  Not Met      PT LONG TERM GOAL #5   Title  Pt will negotiate at least 3 steps with cane and HHA for improved negotiation of stairs into and out of home.    Baseline  Uses cane, banister and assist of family member at home    Time  7    Period  Weeks    Status   Not Met            Plan - 01/10/20 1904    Clinical Impression Statement  Pt has missed several weeks of physical therapy due to transportation issues and other medical appointments.  Requested extension of current Medicaid visits, and 4 were authorized.  Pt returns today to clinic, and LTGs were assessed.  Pt has met 3 of 5 LTGs, with pt improving gait distance to 40 ft.  She is still limited in standing 2-4 minutes in clinic, up to 5 minutes at home, per pt report, and pt continues to have difficulty with stairs.  She will continue to beneift from further skilled PT (see renewal/updated LTGs) to furhter address strength, standing tolerance and gait for improved functional mobility in the home.    Personal Factors and Comorbidities  Comorbidity 3+    Comorbidities  HTN, pre-DM, OSA, fibromyalgia, depression, obesity,  GERD, hypothyroid, OA of multiple js (hands, knees, shoulders)MRI pituitary adenoma: stable    Examination-Activity Limitations  Stand;Stairs;Locomotion Level;Transfers    Examination-Participation Restrictions  Community Activity;Meal Prep;Other   Standing ADLs   Stability/Clinical Decision Making  Evolving/Moderate complexity    Rehab Potential  Good    PT Frequency  1x / week    PT Duration  4 weeks   including week of 01/10/2020   PT Treatment/Interventions  ADLs/Self Care Home Management;Aquatic Therapy;Gait training;Stair training;Functional mobility training;Therapeutic activities;Therapeutic exercise;Balance training;Neuromuscular re-education;Patient/family education    PT Next Visit Plan  Work on standing tolerance at counter/ parallel bars-weigthshifting, step taps, 2" step ups; stair negotiation. training    PT Home Exercise Plan  Access Code: [email or web address]    Consulted and Agree with Plan of Care  Patient       Patient will benefit from skilled therapeutic intervention in order to improve the following deficits and impairments:  Abnormal gait, Decreased range of  motion, Difficulty walking, Decreased endurance, Decreased activity tolerance, Pain, Decreased balance, Impaired flexibility, Decreased mobility, Decreased strength  Visit Diagnosis: Difficulty in walking, not elsewhere classified  Muscle weakness (generalized)  Unsteadiness on feet     Problem List Patient Active Problem List   Diagnosis Date Noted  . Multilevel degenerative disc disease 10/31/2018  . Abdominal pain 10/28/2018  . Change in bowel function 10/28/2018  . Gastritis and gastroduodenitis 04/26/2018  . Atrophic vaginitis 04/01/2018  . Diabetes mellitus ([place]) 03/31/2018  . Upper airway cough syndrome 02/08/2018  . Dysphagia 12/14/2017  . 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 Urinary incontinence 07/23/2017  . Early satiety 06/16/2017  . Hx of iron deficiency anemia 05/28/2017  . Throat pain in adult 04/05/2017  . Prediabetes 02/04/2017  . Dyspnea on exertion 12/23/2016  .  Chronic allergic rhinitis 12/23/2016  . Hemoptysis 12/23/2016  . Fibromyalgia 08/18/2016  . Chronic lymphocytic thyroiditis 08/18/2016  . Depression 04/01/2016  . OSA (obstructive sleep apnea) 04/01/2016  . Essential hypertension 12/09/2015  . Hypothyroidism 12/09/2015  . Morbid obesity due to excess calories ([place]) 12/09/2015  . Constipation 05/27/2015  . Fatty liver 05/27/2015  . Abdominal pain, chronic, epigastric 07/04/2012  . Anxiety 06/09/2012  . History of gastroesophageal reflux (GERD) 06/09/2012  . Hyperlipidemia 06/09/2012  . Refusal of blood transfusions as patient is Jehovah's Witness 06/09/2012  . 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 Abnormal small bowel biopsy 09/18/2011  . Lactose intolerance 09/18/2011  . GERD 01/21/2010    Katelee Schupp W. 01/10/2020, 7:09 PM  Frazier Butt., PT  [facility] [address] [place], [place], [address] Phone: [phone]   Fax:  [phone]  Name: Robin [name] MRN:  [id number] Date of Birth: 01-13-1956   Updated goals for renewal:  PT Long Term Goals - 01/10/20 1911      PT LONG TERM GOAL #1   Title  Pt will verbalize plans for continued fitness upon d/c from PT.  TARGET 02/06/2020    Baseline  independent with current HEP 01/10/2020; continued fatigue and decreased standing tolerance due to BLE weakness and pain    Time  4    Period  Weeks    Status  Revised      PT LONG TERM GOAL #2   Title  Pt will ambulate at least 60 ft using RW with supervision, for improved gait efficiency and safety in the home.    Baseline  40 ft 01/10/2020 with min guard and RW    Time  4    Period  Weeks    Status  Revised      PT LONG TERM GOAL #3   Title  Pt will negotiate 3 steps with 1 handrail, cane, and min guard assistance, to simulate stairs at home.    Baseline  in PT session, pt performs 1 step with rail and cane with min assistance. 01/10/2020    Time  4    Period  Weeks    Status  Revised      PT LONG TERM GOAL #4   Title  Pt will perform at least 8 minutes of standing activities with UE support, for improved participation in ADLs and household tasks.    Baseline  In clinic during PT session, 2-4 minutes of standing/gait, limited due to pain    Time  4    Period  Weeks    Status  Revised      Mady Haagensen, PT 01/10/20 7:15 PM Phone: [phone] Fax: [phone]

## 2020-01-15 ENCOUNTER — Other Ambulatory Visit: Payer: Self-pay

## 2020-01-15 ENCOUNTER — Ambulatory Visit: Payer: Medicaid Other | Admitting: Internal Medicine

## 2020-01-15 ENCOUNTER — Encounter: Payer: Self-pay | Admitting: Internal Medicine

## 2020-01-15 DIAGNOSIS — R0600 Dyspnea, unspecified: Secondary | ICD-10-CM | POA: Diagnosis not present

## 2020-01-15 DIAGNOSIS — R0609 Other forms of dyspnea: Secondary | ICD-10-CM

## 2020-01-15 DIAGNOSIS — R05 Cough: Secondary | ICD-10-CM

## 2020-01-15 DIAGNOSIS — R058 Other specified cough: Secondary | ICD-10-CM

## 2020-01-15 LAB — CBC WITH DIFFERENTIAL/PLATELET
Basophils Absolute: 0.1 10*3/uL (ref 0.0–0.1)
Basophils Relative: 0.6 % (ref 0.0–3.0)
Eosinophils Absolute: 0.1 10*3/uL (ref 0.0–0.7)
Eosinophils Relative: 0.9 % (ref 0.0–5.0)
HCT: 37.3 % (ref 36.0–46.0)
Hemoglobin: 12.1 g/dL (ref 12.0–15.0)
Lymphocytes Relative: 39.3 % (ref 12.0–46.0)
Lymphs Abs: 3.2 10*3/uL (ref 0.7–4.0)
MCHC: 32.4 g/dL (ref 30.0–36.0)
MCV: 89.9 fl (ref 78.0–100.0)
Monocytes Absolute: 1.1 10*3/uL — ABNORMAL HIGH (ref 0.1–1.0)
Monocytes Relative: 12.8 % — ABNORMAL HIGH (ref 3.0–12.0)
Neutro Abs: 3.8 10*3/uL (ref 1.4–7.7)
Neutrophils Relative %: 46.4 % (ref 43.0–77.0)
Platelets: 337 10*3/uL (ref 150.0–400.0)
RBC: 4.15 Mil/uL (ref 3.87–5.11)
RDW: 13.2 % (ref 11.5–15.5)
WBC: 8.2 10*3/uL (ref 4.0–10.5)

## 2020-01-15 MED ORDER — PREDNISONE 10 MG PO TABS: ORAL_TABLET | ORAL | 0 refills | Status: DC

## 2020-01-15 MED FILL — predniSONE 10 MG TABS: 10 | 6 days supply | Qty: 14 | Fill #0

## 2020-01-15 NOTE — Assessment & Plan Note (Signed)
Body mass index is 53.83 kg/m.  -  trending  No change Lab Results  Component Value Date   TSH 2.390 11/13/2019     Contributing to gerd risk/ doe/reviewed the need and the process to achieve and maintain neg calorie balance > defer f/u primary care including intermittently monitoring thyroid status            Each maintenance medication was reviewed in detail including emphasizing most importantly the difference between maintenance and prns and under what circumstances the prns are to be triggered using an action plan format where appropriate.  Total time for H and P, chart review, med reconciliation/ counseling, teaching device and generating customized AVS unique to this office visit / charting = 30 min

## 2020-01-15 NOTE — Progress Notes (Signed)
Subjective:     Patient ID: Robin [name], female   DOB: [birth year]       MRN: [id number]     Brief patient profile:  37  yobf quit smoking in 1994 seen previously for dyspnea, chronic allergic rhinitis and hemoptysis (felt secondary to erosions from tonsils status post bronchoscopy) - has sensation of globus/ dysphagia dating back at least to 2009   Has seen Dr [name] for pos allergies but does not recall details (even symptoms)      TESTS  PFT 02/09/17: FVC  2.35 L (77%) FEV1 2.06 L (85%) FEV1/FVC 0.88 FEF 25-75 3.32 L (142%) negative bronchodilator response TLC 3.74 L (68%) RV 71% ERV 28% DLCO corrected 71% 08/22/15: FVC 2.32 L (119%) FEV1 1.99 L (121%) FEV1/FVC 0.85 FEF 25-75 2.59 L (118%)  6MWT 02/16/17:  Walked 126 meters / Baseline Sat 99%  On RA / Nadir Sat 99% on RA  METHACHOLINE CHALLENGE TEST (02/24/17):  Normal bronchial hyperreactivity.  IMAGING BARIUM SWALLOW/ESOPHAGRAM 04/30/17 (per radiologist):  Hiatal hernia with mild reflux. Esophageal dysmotility  CT NECK/SOFT TISSUE W/ CONTRAST 04/19/17 (per radiologist): No inflammatory change or foreign body can be seen in the pharynx.  Cervical spondylosis. 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  CT CHEST W/O 01/07/17   No parenchymal nodule, mass, or opacity appreciated. No pleural effusion or thickening. No pericardial effusion. No pathologic mediastinal adenopathy.  CTA CHEST 12/13/15   No pulmonary emboli. No pleural effusion or thickening. No pericardial effusion. No pathologic mediastinal adenopathy. No parenchymal nodule, mass, or opacification.  CARDIAC TTE (09/29/16): LV normal in size with moderate concentric hypertrophy. EF 55-60% with no regional wall motion abnormalities. Indeterminant diastolic function. LA & RA normal in size. RV normal in size and function. No aortic stenosis or regurgitation. Aortic root normal in size. No mitral stenosis or regurgitation. No  significant pulmonic regurgitation. No significant tricuspid regurgitation. No pericardial effusion.  LEFT HEART CATHETERIZATION (11/13/3):  Aortic systolic pressure 935  Diastolic pressure 90  Left ventricular systolic pressure 701  End-diastolic pressure 27 left main coronary artery normal  Left anterior descending artery normal  Left circumflex coronary artery normal  Right coronary artery dominant and normal   Overall estimated ejection fraction greater than 60% without focal wall motion abnormality  MICROBIOLOGY Endobronchial/Endotracheal Brushing 03/08/17: Rare Haemophilus influenza beta lactamase positive  PATHOLOGY Endobronchial/Endotracheal Brushing 03/08/17: No malignancy. Negative for herpes & HPV.  LABS 05/28/17 BNP:  6.1 ESR:  34 TSH:  1.060 Anti-CCP:  6 RF:  <10  02/16/17 IgE: 81 RAST panel: Cockroach 0.2 / D. farinae 0.2 / D. pteronyssinus 0.24   12/23/16 INR: 1.0 PTT: 24.1 Chromatin Ab:  <0.2 Smith Ab: <0.2 DS DNA Ab:  <1 SSA:  <0.2 SSB:  <0.2 Anti-CCP:  <16 SCL-70:  <0.2 ENA RNP Ab: Centromere Ab Screen:  <0.2 Jo-1 Ab:  <0.2  05/12/16 ANA: Negative Rheumatoid factor:  <10     02/08/2018 acute extended ov/Reshunda Strider re:  acute on chronic cough x decades / establish with me [name] pt) Chief Complaint  Patient presents with  . Acute Visit    Pt c/o cough with yellow, wheezing and SOB x 3 days. She states her chest feels sore when she coughs. She has been using her albuterol inahler 3 x daily on average.   baseline = maint protonix right before first meal then before supper, and singuliar and needing saba  3 x  Times a day but not noct then severe  cough /subj  wheeze/ chest soreness 3 days prior to OV and sob with any activity  rec Prednisone 10 mg take  4 each am x 2 days,   2 each am x 2 days,  1 each am x 2 days and stop  zpak Protonix 40  Mg Take 30- 60 min before your first and last meals of the day  For drainage / throat tickle try take  CHLORPHENIRAMINE  4 mg - take one every 4 hours as needed   Take delsym two tsp every 12 hours and supplement if needed with  tramadol 50 mg up to 2 every 4 hours GERD  Diet   Please schedule a follow up office visit in 4 weeks, sooner if needed  with all medications /inhalers/ solutions in hand so we can verify exactly what you are taking. This includes all medications from all doctors and over the counters      03/08/2018  f/u ov/Chala Gul re:  Cough x decades / did not bring all meds but most of them  Chief Complaint  Patient presents with  . Follow-up    Breathing is unchanged. She is still coughing up yellow sputum. She is using her albuterol inhaler 3 x per wk on average.   Dyspnea:  Even if not coughing doe x room to room  X  Years = MMRC3 = can't walk 100 yards even at a slow pace at a flat grade s stopping due to sob  = stops a lot to shop  Cough: fits of cough are  Sporadic daytime mostly  Sleep: she says fine, husband hears wheezing  SABA use:  Doesn't really help/ note MCT neg  rec Stop corevidol and losartan and instead try bisoprolol 5 mg twice daily  For cough try tessalon 200 mg every 8 hours as needed  For drainage / throat tickle try take CHLORPHENIRAMINE  4 mg - take one every 4 hours as needed   Please see patient coordinator before you leave today  to schedule sinus CT> never done denied by insurance          07/19/2018  Acute extended  ov/Hady Niemczyk re: cough / dysphagia/ globus > 10 y worse since off gabapentin 300 mg x 4 weeks with worse doe also / did not bring updated med calendar as requested  Chief Complaint  Patient presents with  . Acute Visit    Increased SOB and cough x 3-4 wks. Her cough is occ prod with green to yellow sputum.  She also c/o hoarseness.   Dyspnea:   MMRC3 = can't walk 100 yards even at a slow pace at a flat grade s stopping due to sob  - can't do  [employer] Cough:  sporadic Sleeping: sleeps fine bed flat/ 2 pillows  SABA use: none Says  tessalon works the best for cough rec Add back gabapentin 300 mg four times a day as per med calendar Change tessilon to 200 mg up to every 8 hours as needed for cough  and stop delsym If can't stop coughing then add hydrocodone up to every 4 hours if needed  If not better next step is to return to ENT ([place]) then if not satisfied please return here  with all medications /inhalers/ solutions     05/11/2019  f/u ov/Berthold Glace re: uacs / MO with doe /brought meds but they don't correlate well with med list  Chief Complaint  Patient presents with  . Follow-up    Patient reports that   her sob has increased and she still has a dry cough.   Dyspnea:  Was able to walk at [place], now using scooter x 2 month Cough: still severe cough day > noct, dry > wet with globus sensatin and hoarseness but still subj "wheezeling"  Sleeping: sleeps ok  in flat bed, several pillows SABA use: no better with saba  02: none  Overt hb despite dexilant / no longer on aciphex though still listed rec Prednisone 10 mg take  4 each am x 2 days,   2 each am x 2 days,  1 each am x 2 days and stop  GERD (REFLUX)   For congestion ok to chlotrabs 4 mg x 2 every hours as needed  We will  schedule ENT eval/ [facility] re your cough    12/04/2019  f/u ov/Tarae Wooden re: uacs  Chief Complaint  Patient presents with  . Follow-up    Increased cough and congestion x 4 months. She will occ produce some yellow to green sputum. She is using her albuterol inhaler about 2 x per day on average.   Dyspnea:  Uses walker across the room then stops < 25 ft x months  Cough: esp daytime min mucoid Sleeping: on side flat bed 4 pillows  SABA use: 2 different albuterols/ easily confused with details of care  02: none  rec Prednisone 10 mg take  4 each am x 2 days,   2 each am x 2 days,  1 each am x 2 days and stop  Try dexilant 60 mg   Take 30-60 min before first meal of the day and Pepcid ac (famotidine) 20 mg one after supper until return to office   GERD diet  For congestion ok to chlotrabs 4 mg x 2 every hours as needed  Only use your albuterol as a rescue medication to be used if you can't catch your breath  See  ENT eval/ [facility] re your cough and ears   01/15/2020  f/u ov/Ferne Ellingwood re: uacs/ ? Allergic rhinitis with pnds (w/u by [name] previously and see last RAST 2018 ) / did not bring chlortabs but says taking 2 bid Chief Complaint  Patient presents with  . Follow-up    Coughing less since the last visit but she states her SOB and wheezing seem worse. She is using her albuterol inhaler at least 3 x per day.   Dyspnea: room to room really no better p pred / more limited by L hip/knee pain Cough: better p prednisone, some worse since completed Sleeping: flat bed with 4 pillows wakes up to br p 3-4 h and cough anad wheeze when walking to BR SABA use: during the day with activity (0% effective technique - see a/p)  02: none    No obvious day to day or daytime variability or assoc excess/ purulent sputum or mucus plugs or hemoptysis or cp or chest tightness, subjective wheeze or overt sinus or hb symptoms.     Also denies any obvious fluctuation of symptoms with weather or environmental changes or other aggravating or alleviating factors except as outlined above   No unusual exposure hx or h/o childhood pna/ asthma or knowledge of premature birth.  Current Allergies, Complete Past Medical History, Past Surgical History, Family History, and Social History were reviewed in [employer] record.  ROS  The following are not active complaints unless bolded Hoarseness, sore throat= globus, dysphagia, dental problems, itching, sneezing,  nasal congestion or discharge of excess mucus or  purulent secretions, ear ache,   fever, chills, sweats, unintended wt loss or wt gain, classically pleuritic or exertional cp,  orthopnea pnd or arm/hand swelling  or leg swelling, presyncope, palpitations, abdominal pain, anorexia, nausea,  vomiting, diarrhea  or change in bowel habits or change in bladder habits, change in stools or change in urine, dysuria, hematuria,  rash, arthralgias, visual complaints, headache, numbness, weakness or ataxia or problems with walking or coordination,  change in mood or  memory.        Current Meds  Medication Sig  . ACCU-CHEK AVIVA PLUS test strip USE AS DIRECTED  . acetaminophen (TYLENOL) 500 MG tablet Take 1,000 mg by mouth 2 (two) times daily.   Marland Kitchen albuterol (VENTOLIN HFA) 108 (90 Base) MCG/ACT inhaler Inhale 1-2 puffs into the lungs every 6 (six) hours as needed for wheezing or shortness of breath.  Marland Kitchen amitriptyline (ELAVIL) 50 MG tablet TAKE 1 TABLET BY MOUTH AT BEDTIME. (Patient taking differently: Take 50 mg by mouth at bedtime. )  . aspirin EC 81 MG tablet Take 81 mg by mouth at bedtime.  Marland Kitchen atorvastatin (LIPITOR) 20 MG tablet Take 0.5 tablets (10 mg total) by mouth daily.  Marland Kitchen buPROPion (WELLBUTRIN XL) 300 MG 24 hr tablet TAKE 1 TABLET (300 MG TOTAL) BY MOUTH EVERY MORNING. (Patient taking differently: Take 300 mg by mouth daily. )  . calcium carbonate (TUMS - DOSED IN MG ELEMENTAL CALCIUM) 500 MG chewable tablet Chew 2 tablets by mouth at bedtime.  . celecoxib (CELEBREX) 100 MG capsule Take 100 mg by mouth 2 (two) times daily.  . cetirizine (ZYRTEC) 10 MG tablet Take 1 tablet (10 mg total) by mouth daily.  . chlorpheniramine (CHLOR-TRIMETON) 4 MG tablet Take 4 mg by mouth every 4 (four) hours as needed for allergies.   . Cholecalciferol 1.25 MG (50000 UT) capsule Take 50,000 Units by mouth once a week.   Marland Kitchen CINNAMON PO Take 1 capsule by mouth daily.  Marland Kitchen DEXILANT 60 MG capsule TAKE 1 CAPSULE (60 MG TOTAL) BY MOUTH DAILY. (Patient taking differently: Take 60 mg by mouth daily. )  . diclofenac sodium (VOLTAREN) 1 % GEL Apply 2-4 g topically 4 (four) times daily as needed (PAIN.).  Marland Kitchen DULoxetine (CYMBALTA) 30 MG capsule TAKE 1 CAPSULE (30 MG TOTAL) BY MOUTH DAILY.  . famotidine (PEPCID) 20 MG  tablet One after supper (Patient taking differently: Take 20 mg by mouth every evening. One after supper)  . fluticasone (FLONASE) 50 MCG/ACT nasal spray Place 1 spray into both nostrils daily.  . furosemide (LASIX) 40 MG tablet TAKE 1/2 TO 1 TABLET BY MOUTH DAILY FOR LOWER EXTREMITY SWELLING (Patient taking differently: Take 20-40 mg by mouth daily. )  . gabapentin (NEURONTIN) 300 MG capsule Take 1 capsule (300 mg total) by mouth 4 (four) times daily.  Marland Kitchen glucose blood (ONETOUCH ULTRA) test strip Use as instructed  . glucose blood test strip Accu-Chek Aviva Plus test strips  . glucose blood test strip   . hydrALAZINE (APRESOLINE) 50 MG tablet TAKE 1 TABLET (50 MG TOTAL) BY MOUTH 3 (THREE) TIMES DAILY. (Patient taking differently: Take 100 mg by mouth 3 (three) times daily. )  . levothyroxine (SYNTHROID) 88 MCG tablet Take 1 tablet (88 mcg total) by mouth daily.  Marland Kitchen LINZESS 290 MCG CAPS capsule TAKE 1 CAPSULE BY MOUTH DAILY BEFORE BREAKFAST. (Patient taking differently: Take 290 mcg by mouth daily. )  . metFORMIN (GLUCOPHAGE) 500 MG tablet TAKE 1/2 TABLET DAILY WITH BREAKFAST (Patient taking  differently: Take 250 mg by mouth daily. )  . Metoprolol Tartrate 75 MG TABS TAKE 1 TABLET BY MOUTH TWICE DAILY. (Patient taking differently: Take 75 mg by mouth in the morning and at bedtime. )  . montelukast (SINGULAIR) 10 MG tablet TAKE 1 TABLET BY MOUTH AT BEDTIME. (Patient taking differently: Take 10 mg by mouth at bedtime. )  . Multiple Vitamin (MULTIVITAMIN WITH MINERALS) TABS tablet Take 1 tablet by mouth daily. Alive Women's Multivitamin  . Omega-3 Fatty Acids (FISH OIL) 1000 MG CAPS Take 1,000 mg by mouth every evening.  Marland Kitchen OVER THE COUNTER MEDICATION Take 1,000 mg by mouth daily. BEET ROOT   . oxyCODONE-acetaminophen (PERCOCET) 10-325 MG tablet Take 1 tablet by mouth 2 (two) times daily.   Marland Kitchen PREMARIN vaginal cream Place 1 Applicatorful vaginally at bedtime.   . sodium chloride (OCEAN) 0.65 % SOLN nasal  spray Place 2 sprays into both nostrils every 4 (four) hours as needed for congestion.  Marland Kitchen spironolactone (ALDACTONE) 25 MG tablet Take 1 tablet (25 mg total) by mouth daily.  . vitamin B-12 (CYANOCOBALAMIN) 1000 MCG tablet Take 1,000 mcg by mouth daily.                         Objective:   Physical Exam   Massively  obese w/c bound bf nad / no spont cough   01/15/2020         354  12/04/2019           355  05/11/2019           331  07/19/2018       328  03/08/2018            337   02/08/18 (!) 333 lb (151 kg)  01/27/18 (!) 328 lb 9.6 oz (149.1 kg)  12/14/17 (!) 326 lb 6.4 oz (148.1 kg)      Vital signs reviewed  01/15/2020  - Note at rest 02 sats  97% on RA      HEENT : pt wearing mask not removed for exam due to covid -19 concerns.    NECK :  without JVD/Nodes/TM/ nl carotid upstrokes bilaterally   LUNGS: no acc muscle use,  Nl contour chest which is clear to A and P bilaterally without cough on insp or exp maneuvers   CV:  RRR  no s3 or murmur or increase in P2, and  Trace pitting bilateral sym LE edema   ABD:  soft and nontender with nl inspiratory excursion in the supine position. No bruits or organomegaly appreciated, bowel sounds nl  MS:  Ext warm without deformities, calf tenderness, cyanosis or clubbing No obvious joint restrictions   SKIN: warm and dry without lesions    NEURO:  alert, approp, nl sensorium with  no motor or cerebellar deficits apparent.    Labs ordered 01/15/2020  :  allergy profile            Assessment:

## 2020-01-15 NOTE — Patient Instructions (Addendum)
Work on inhaler technique:  relax and gently blow all the way out then take a nice smooth deep breath back in, triggering the inhaler at same time you start breathing in.  Rinse and gargle with water when done  Try albuterol (ventolin) 15 min before an activity that you know would make you short of breath and see if it makes any difference and if makes none then don't take it after activity unless you can't catch your breath.  If your breathing gets worse > Prednisone 10 mg take  4 each am x 2 days,   2 each am x 2 days,  1 each am x 2 days and stop   For drainage / throat tickle try take CHLORPHENIRAMINE  4 mg  (Chlortab 4mg   at [employer] should be easiest to find in the green box)  take one-two  every 4 hours as needed - available over the counter- may cause drowsiness so start with just a bedtime dose or two and see how you tolerate it before trying in daytime     Please schedule a follow up office visit in 6 weeks(or first available) , call sooner if needed with PFTs    - bring your walker and your inhaler (don't use it) and your chlorpheniramine

## 2020-01-15 NOTE — Assessment & Plan Note (Signed)
LHC  09/29/16 LVEDP 27  TTE (09/29/16): LV normal in size with moderate concentric hypertrophy. EF 55-60% with no regional wall motion abnormalities. Indeterminant diastolic function. LA & RA normal in size. RV normal in size and function. No aortic stenosis or regurgitation. Aortic root normal in size. No mitral stenosis or regurgitation. No significant pulmonic regurgitation. No significant tricuspid regurgitation. No pericardial effusion. - 03/08/2018   Walked RA x one lap @ 185 stopped due to  Tired > sob/ slow pace no desat - Spirometry 03/08/2018  FEV1 1.55 (63%)  Ratio 84 with min curvature - try off losartan/ coreg 03/08/2018  > no better as of 07/19/2018  - 07/19/2018   Walked RA x one lap @ 185 stopped due to sob with nl sats, fast pace considering she was using cane. - 05/11/2019   Walked RA x one lap =  approx 250 ft - stopped due to  Sob and back/hip/leg pain but sats still 99% at end      - The proper method of use, as well as anticipated side effects, of a metered-dose inhaler are discussed and demonstrated to the patient. Improved effectiveness after extensive coaching during this visit to a level of approximately 0 % from a baseline of 0 % > strongly argues against asthma but of course not uacs/ vcd as a cause of doe with "wheezing" in light of prior neg MCT.    Strongly suspect this is really obesity/ gerd/ uacs and not asthma related doe esp since unable to coach her on how to use empirical saba effectively   >>> rec return for full pfts  I spent extra time with pt today reviewing appropriate use of albuterol for prn use on exertion with the following points: 1) saba is for relief of sob that does not improve by walking a slower pace or resting but rather if the pt does not improve after trying this first. 2) If the pt is convinced, as many are, that saba helps recover from activity faster then it's easy to tell if this is the case by re-challenging : ie stop, take the inhaler, then p 5  minutes try the exact same activity (intensity of workload) that just caused the symptoms and see if they are substantially diminished or not after saba 3) if there is an activity that reproducibly causes the symptoms, try the saba 15 min before the activity on alternate days   If in fact the saba really does help, then fine to continue to use it prn but advised may need to look closer at the maintenance regimen being used to achieve better control of airways disease with exertion.

## 2020-01-15 NOTE — Assessment & Plan Note (Signed)
Onset 2009? Allergy profile 02/16/17  >  Eos 0.1 /  IgE  81 pos dust/ cockroach  - MCT 02/24/17 neg  - DgEs  01/20/18 : Small reducible hiatal hernia with trace gastroesophageal reflux. No evidence of stricture. - Cyclical cough protocol 02/08/2018  - sinus CT 03/10/2018 >>> informed  Insurance decllned> referred back to ENT Erik Obey) [id number] - worse off gabapentin 07/19/2018 > restart 300 qid  - Allergy profile 01/15/2020 >  Eos 0. /  IgE   Reports cough much better on prednisone than off but no improvement in doe (see sep a/p) so may have som T-2 driven upper airway component > if worse again ok to do one more round of prednisone but first try 1st gen H1 blockers per guidelines  (says she is but failed to produce the med @ med reconciliation today.

## 2020-01-16 LAB — RESPIRATORY ALLERGY PROFILE REGION II ~~LOC~~

## 2020-01-16 LAB — INTERPRETATION:

## 2020-01-16 NOTE — Progress Notes (Signed)
ATC the pt, there was no answer and her VM is full

## 2020-01-17 DIAGNOSIS — M25552 Pain in left hip: Secondary | ICD-10-CM | POA: Diagnosis not present

## 2020-01-17 DIAGNOSIS — M545 Low back pain: Secondary | ICD-10-CM | POA: Diagnosis not present

## 2020-01-17 DIAGNOSIS — M79641 Pain in right hand: Secondary | ICD-10-CM | POA: Diagnosis not present

## 2020-01-17 DIAGNOSIS — M25551 Pain in right hip: Secondary | ICD-10-CM | POA: Diagnosis not present

## 2020-01-18 ENCOUNTER — Encounter: Payer: Self-pay | Admitting: Physical Therapy

## 2020-01-18 ENCOUNTER — Other Ambulatory Visit (HOSPITAL_COMMUNITY)
Admission: RE | Admit: 2020-01-18 | Discharge: 2020-01-18 | Disposition: A | Discharge status: Home / Self Care | Payer: Medicaid Other | Source: Ambulatory Visit | Attending: Cardiovascular Disease | Admitting: Cardiovascular Disease

## 2020-01-18 ENCOUNTER — Ambulatory Visit: Payer: Medicaid Other | Admitting: Physical Therapy

## 2020-01-18 ENCOUNTER — Other Ambulatory Visit: Payer: Self-pay

## 2020-01-18 DIAGNOSIS — M6281 Muscle weakness (generalized): Secondary | ICD-10-CM

## 2020-01-18 DIAGNOSIS — Z01812 Encounter for preprocedural laboratory examination: Secondary | ICD-10-CM | POA: Insufficient documentation

## 2020-01-18 DIAGNOSIS — Z20822 Contact with and (suspected) exposure to covid-19: Secondary | ICD-10-CM | POA: Diagnosis not present

## 2020-01-18 DIAGNOSIS — F331 Major depressive disorder, recurrent, moderate: Secondary | ICD-10-CM | POA: Diagnosis not present

## 2020-01-18 DIAGNOSIS — R262 Difficulty in walking, not elsewhere classified: Secondary | ICD-10-CM

## 2020-01-18 DIAGNOSIS — R2681 Unsteadiness on feet: Secondary | ICD-10-CM

## 2020-01-18 LAB — SARS CORONAVIRUS 2 (TAT 6-24 HRS): SARS Coronavirus 2: NEGATIVE

## 2020-01-19 NOTE — Therapy (Signed)
[facility] [address] [place], [place], [address] Phone: [phone]   Fax:  [phone]  Physical Therapy Treatment  Patient Details  Name: Robin [name] MRN: [id number] Date of Birth: [birth year] Referring Provider (PT): Karle Plumber, MD   Encounter Date: 01/18/2020  PT End of Session - 01/18/20 1630    Visit Number  10    Number of Visits  12    Date for PT Re-Evaluation  02/06/20    Authorization Type  Medicaid    Authorization Time Period  4 visits 01/10/20-02/06/2020 (this was based on request to extend date for 4 visits not previously used)    Authorization - Visit Number  2    Authorization - Number of Visits  4    PT Start Time  7035   pt in bathroom prior to session   PT Stop Time  1611    PT Time Calculation (min)  39 min    Equipment Utilized During Treatment  Other (comment)   RW with transfers   Activity Tolerance  Patient limited by pain;Patient tolerated treatment well;Patient limited by fatigue    Behavior During Therapy  [facility] for tasks assessed/performed       Past Medical History:  Diagnosis Date  . Anxiety disorder   . Arthritis   . Asthma   . Chronic neck pain   . Constipation   . Depression   . Diabetes mellitus without complication ([place])   . Dyspnea   . GERD (gastroesophageal reflux disease)   . Hiatal hernia    small  . Hyperlipidemia   . Hypertension   . Hypothyroidism   . Schatzki's ring    non critical  . Sleep apnea    CPAP, Sleep study at [facility]  . Sleep apnea     Past Surgical History:  Procedure Laterality Date  . ABDOMINAL HYSTERECTOMY    . ABDOMINAL HYSTERECTOMY  02/18/2000  . CARDIAC CATHETERIZATION  08/18/2003   normal L main/LAD/L Cfx/RCA (Dr. Adora Fridge)  . COLONOSCOPY  2006   Dr. [name], hyperplastic polyps  . COLONOSCOPY  08/06/11   abnormal terminal ileum for 10cm, erosions, geographical ulceration. Bx small bowel mucosa with  prominent intramucosal lymphoid aggregates, slightly inflammed  . COLONOSCOPY WITH PROPOFOL N/A 12/28/2019   Procedure: COLONOSCOPY WITH PROPOFOL;  Surgeon: [name], MD;  [facility];  Service: Endoscopy;  Laterality: N/A;  8:30am  . DIRECT LARYNGOSCOPY  03/15/2012   Procedure: DIRECT LARYNGOSCOPY;  Surgeon: [name], MD;  Location: [facility];  Service: ENT;  Laterality: N/A; Dr. Wolicki--:>no foreign body seen. normal esophagus to 40cm  . ESOPHAGEAL DILATION  04/17/2015   Procedure: ESOPHAGEAL DILATION;  Surgeon: [name], MD;  [facility];  Service: Endoscopy;;  . ESOPHAGOGASTRODUODENOSCOPY  08/23/2008   KKX:FGHW distal esophageal erosions consistent with mild erosive reflux esophagitis, otherwise unremarkable esophagus/ Tiny antral erosions of doubtful clinical significance, otherwise normal stomach, patent pylorus, normal D1 and D2  . ESOPHAGOGASTRODUODENOSCOPY  08/06/11   small hh, noncritical Schatzki's ring s/p 57 F  . ESOPHAGOGASTRODUODENOSCOPY N/A 04/17/2015   EXH:[id number] s/p dilation  . ESOPHAGOGASTRODUODENOSCOPY (EGD) WITH PROPOFOL N/A 01/20/2018   Dr. [name]: Erosive gastropathy, normal-appearing esophagus status post empiric dilation.  Chronic gastritis, no H. pylori.  . ESOPHAGOSCOPY  03/15/2012   Procedure: ESOPHAGOSCOPY;  Surgeon: [name], MD;  Location: [facility];  Service: ENT;  Laterality: N/A;  . KNEE ARTHROSCOPY  04/27/2001  .  MALONEY DILATION N/A 01/20/2018   Procedure: Venia Minks DILATION;  Surgeon: [name], MD;  [facility];  Service: Endoscopy;  Laterality: N/A;  . [place]  2003   negative bruce protocol exercise tress test; EF 68%; intermediate risk study due to evidence of anterior wall ischemia extending from mid-ventricle to apex  . PITUITARY SURGERY  06/2012   benign tumor, surgeon at [facility]  . POLYPECTOMY  12/28/2019   Procedure: POLYPECTOMY;  Surgeon: [name], MD;  [facility];  Service: Endoscopy;;  . RECTOCELE REPAIR    . TRANSTHORACIC ECHOCARDIOGRAM  2003   EF normal  . VAGINAL PROLAPSE REPAIR    . VIDEO BRONCHOSCOPY Bilateral 03/08/2017   Procedure: VIDEO BRONCHOSCOPY WITHOUT FLUORO;  Surgeon: Javier Glazier, MD;  Location: [facility];  Service: Cardiopulmonary;  Laterality: Bilateral;    There were no vitals filed for this visit.  Subjective Assessment - 01/18/20 1533    Subjective  No new complaitns. No falls.    Patient is accompained by:  Family member    Pertinent History  PMH: HTN, pre-DM, OSA, fibromyalgia, depression, obesity,  GERD, hypothyroid, OA of multiple js (hands, knees, shoulders)MRI pituitary adenoma: stable; pinched nerve in low back    Limitations  Walking;Standing    How long can you stand comfortably?  5 min    How long can you walk comfortably?  Walks if she needs to; always having pain    Patient Stated Goals  Pt's goal for therapy is to try to get around better than I am now.    Currently in Pain?  Yes    Pain Score  8     Pain Location  Knee    Pain Orientation  Right;Left    Pain Descriptors / Indicators  Discomfort;Aching    Pain Type  Chronic pain    Pain Onset  More than a month ago    Aggravating Factors   immobility, increased standing/walking    Pain Relieving Factors  medication helps, resting, excercies helps some             [facility] Adult PT Treatment/Exercise - 01/18/20 1537      Transfers   Transfers  Sit to Stand;Stand to Sit    Sit to Stand  5: Supervision;With upper extremity assist;From chair/3-in-1;From bed    Stand to Sit  5: Supervision;With upper extremity assist;To chair/3-in-1;To bed    Comments  Performed x 6 reps throughout session.        Ambulation/Gait   Ambulation/Gait  Yes    Ambulation/Gait Assistance  4: Min guard    Ambulation/Gait Assistance Details  cues on posture, walker position and bil step length with gait.     Ambulation Distance (Feet)  36 Feet   x1   Assistive  device  Rolling walker    Gait Pattern  Step-through pattern;Decreased step length - right;Decreased step length - left;Decreased hip/knee flexion - right;Decreased hip/knee flexion - left;Decreased dorsiflexion - right;Decreased dorsiflexion - left;Right flexed knee in stance;Left flexed knee in stance;Trunk flexed;Poor foot clearance - left;Poor foot clearance - right;Antalgic    Ambulation Surface  Level;Indoor      Exercises   Exercises  Other Exercises    Other Exercises   standing to outside of parallel bars with power chair behind: alternating UE raises for 5 reps each, then upper trunk rotation left<>right for 5 reps each with min guard assist. seated rest break.  then in standing  mini squats for 5 reps, followed by alternating foot taps to bottom of parallel bars for 5 reps each. then a seated rest break. Pt then performed a second set of all of these ex's with seated rest breaks as previously stated.        Knee/Hip Exercises: Aerobic   Nustep  level 3.0 for 5 minutes with all 4 extremities, rest for 3 minutes, then 5 more minutes of work. goal >/= 40 steps per minute each set of work for strengthening/activity tolerance. VVS with Nustep.            PT Short Term Goals - 11/15/19 2778      PT SHORT TERM GOAL #1   Title  Pt will be independent with HEP to address strength, flexibility, balance, and gait progression.  TARGET 11/03/2019    Baseline  11/15/19: met with curent ex program    Status  Achieved    Target Date  11/03/19      PT SHORT TERM GOAL #2   Title  Pt will be ambulate at least 15 ft using RW (vs cane), min assist for improved gait within home.    Baseline  11/15/19: 20 feet with wide RW min gaurd to min assist    Time  --    Period  --    Status  Achieved    Target Date  11/03/19      PT SHORT TERM GOAL #3   Title  Pt will perform sit<>stand with minimal assistance for improved transfer efficiency and safety.    Baseline  11/15/19: met in session today    Time   --    Period  --    Status  Achieved    Target Date  11/03/19        PT Long Term Goals - 01/10/20 1911      PT LONG TERM GOAL #1   Title  Pt will verbalize plans for continued fitness upon d/c from PT.  TARGET 02/06/2020    Baseline  independent with current HEP 01/10/2020; continued fatigue and decreased standing tolerance due to BLE weakness and pain    Time  4    Period  Weeks    Status  Revised      PT LONG TERM GOAL #2   Title  Pt will ambulate at least 60 ft using RW with supervision, for improved gait efficiency and safety in the home.    Baseline  40 ft 01/10/2020 with min guard and RW    Time  4    Period  Weeks    Status  Revised      PT LONG TERM GOAL #3   Title  Pt will negotiate 3 steps with 1 handrail, cane, and min guard assistance, to simulate stairs at home.    Baseline  in PT session, pt performs 1 step with rail and cane with min assistance. 01/10/2020    Time  4    Period  Weeks    Status  Revised      PT LONG TERM GOAL #4   Title  Pt will perform at least 8 minutes of standing activities with UE support, for improved participation in ADLs and household tasks.    Baseline  In clinic during PT session, 2-4 minutes of standing/gait, limited due to pain    Time  4    Period  Weeks    Status  Revised  Plan - 01/19/20 0754    Clinical Impression Statement  Today's skilled session continued to focus on LE strengthening, gait with RW and standing activities working on increased standing tolerance. Rest breaks needed throughout session due to knee pain and fatigue. The pt should benefit from continued PT to progress toward unmet goals.    Personal Factors and Comorbidities  Comorbidity 3+    Comorbidities  HTN, pre-DM, OSA, fibromyalgia, depression, obesity,  GERD, hypothyroid, OA of multiple js (hands, knees, shoulders)MRI pituitary adenoma: stable    Examination-Participation Restrictions  Community Activity;Meal Prep;Other   standing ADL's    Stability/Clinical Decision Making  Evolving/Moderate complexity    Rehab Potential  Good    PT Frequency  1x / week    PT Duration  4 weeks    PT Treatment/Interventions  ADLs/Self Care Home Management;Aquatic Therapy;Gait training;Stair training;Functional mobility training;Therapeutic activities;Therapeutic exercise;Balance training;Neuromuscular re-education;Patient/family education    PT Next Visit Plan  Work on standing tolerance at counter/ parallel bars-weigthshifting, step taps, 2" step ups; stair negotiation. training    PT Home Exercise Plan  Access Code: [email or web address]    Consulted and Agree with Plan of Care  Patient       Patient will benefit from skilled therapeutic intervention in order to improve the following deficits and impairments:  Abnormal gait, Decreased range of motion, Difficulty walking, Decreased endurance, Decreased activity tolerance, Pain, Decreased balance, Impaired flexibility, Decreased mobility, Decreased strength  Visit Diagnosis: Difficulty in walking, not elsewhere classified  Muscle weakness (generalized)  Unsteadiness on feet     Problem List Patient Active Problem List   Diagnosis Date Noted  . Multilevel degenerative disc disease 10/31/2018  . Abdominal pain 10/28/2018  . Change in bowel function 10/28/2018  . Gastritis and gastroduodenitis 04/26/2018  . Atrophic vaginitis 04/01/2018  . Diabetes mellitus ([place]) 03/31/2018  . Upper airway cough syndrome 02/08/2018  . Dysphagia 12/14/2017  . 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 Urinary incontinence 07/23/2017  . Early satiety 06/16/2017  . Hx of iron deficiency anemia 05/28/2017  . Throat pain in adult 04/05/2017  . Prediabetes 02/04/2017  . Dyspnea on exertion 12/23/2016  . Chronic allergic rhinitis 12/23/2016  . Hemoptysis 12/23/2016  . Fibromyalgia 08/18/2016  . Chronic lymphocytic thyroiditis 08/18/2016  . Depression 04/01/2016  . OSA (obstructive sleep apnea) 04/01/2016  . Essential  hypertension 12/09/2015  . Hypothyroidism 12/09/2015  . Morbid obesity due to excess calories ([place]) 12/09/2015  . Constipation 05/27/2015  . Fatty liver 05/27/2015  . Abdominal pain, chronic, epigastric 07/04/2012  . Anxiety 06/09/2012  . History of gastroesophageal reflux (GERD) 06/09/2012  . Hyperlipidemia 06/09/2012  . Refusal of blood transfusions as patient is Jehovah's Witness 06/09/2012  . 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 Abnormal small bowel biopsy 09/18/2011  . Lactose intolerance 09/18/2011  . GERD 01/21/2010    Willow Ora, PTA, [facility] [address] [place], [place] [address] [phone] 01/19/20, 8:00 AM   Name: OCTA UPLINGER MRN: [id number] Date of Birth: 11/29/1955

## 2020-01-20 ENCOUNTER — Other Ambulatory Visit: Payer: Self-pay

## 2020-01-20 ENCOUNTER — Ambulatory Visit (HOSPITAL_BASED_OUTPATIENT_CLINIC_OR_DEPARTMENT_OTHER): Payer: Medicaid Other | Attending: Cardiovascular Disease | Admitting: Cardiovascular Disease

## 2020-01-20 DIAGNOSIS — Z7952 Long term (current) use of systemic steroids: Secondary | ICD-10-CM | POA: Insufficient documentation

## 2020-01-20 DIAGNOSIS — Z7982 Long term (current) use of aspirin: Secondary | ICD-10-CM | POA: Insufficient documentation

## 2020-01-20 DIAGNOSIS — Z79899 Other long term (current) drug therapy: Secondary | ICD-10-CM | POA: Insufficient documentation

## 2020-01-20 DIAGNOSIS — Z7984 Long term (current) use of oral hypoglycemic drugs: Secondary | ICD-10-CM | POA: Diagnosis not present

## 2020-01-20 DIAGNOSIS — G4733 Obstructive sleep apnea (adult) (pediatric): Secondary | ICD-10-CM

## 2020-01-22 MED FILL — GABAPENTIN 300 MG CAPSULE: 300 | 30 days supply | Qty: 120 | Fill #1

## 2020-01-22 NOTE — Progress Notes (Signed)
Patient identification verified. Results of recent lab work reviewed. Per Dr. Melvyn Novas, studies are negative for respiratory allergies, no changes in recommendations. Patient verbalized understanding of results.

## 2020-01-23 ENCOUNTER — Ambulatory Visit: Payer: Medicaid Other | Admitting: Physical Therapy

## 2020-01-23 ENCOUNTER — Other Ambulatory Visit: Payer: Self-pay

## 2020-01-24 ENCOUNTER — Ambulatory Visit (INDEPENDENT_AMBULATORY_CARE_PROVIDER_SITE_OTHER): Payer: Medicaid Other | Admitting: Podiatry

## 2020-01-24 ENCOUNTER — Encounter: Payer: Self-pay | Admitting: Podiatry

## 2020-01-24 VITALS — Temp 96.4°F

## 2020-01-24 DIAGNOSIS — M79675 Pain in left toe(s): Secondary | ICD-10-CM | POA: Diagnosis not present

## 2020-01-24 DIAGNOSIS — M79674 Pain in right toe(s): Secondary | ICD-10-CM

## 2020-01-24 DIAGNOSIS — E119 Type 2 diabetes mellitus without complications: Secondary | ICD-10-CM

## 2020-01-24 DIAGNOSIS — B351 Tinea unguium: Secondary | ICD-10-CM

## 2020-01-24 NOTE — Patient Instructions (Signed)
Diabetes Mellitus and Foot Care Foot care is an important part of your health, especially when you have diabetes. Diabetes may cause you to have problems because of poor blood flow (circulation) to your feet and legs, which can cause your skin to:  Become thinner and drier.  Break more easily.  Heal more slowly.  Peel and crack. You may also have nerve damage (neuropathy) in your legs and feet, causing decreased feeling in them. This means that you may not notice minor injuries to your feet that could lead to more serious problems. Noticing and addressing any potential problems early is the best way to prevent future foot problems. How to care for your feet Foot hygiene  Wash your feet daily with warm water and mild soap. Do not use hot water. Then, pat your feet and the areas between your toes until they are completely dry. Do not soak your feet as this can dry your skin.  Trim your toenails straight across. Do not dig under them or around the cuticle. File the edges of your nails with an emery board or nail file.  Apply a moisturizing lotion or petroleum jelly to the skin on your feet and to dry, brittle toenails. Use lotion that does not contain alcohol and is unscented. Do not apply lotion between your toes. Shoes and socks  Wear clean socks or stockings every day. Make sure they are not too tight. Do not wear knee-high stockings since they may decrease blood flow to your legs.  Wear shoes that fit properly and have enough cushioning. Always look in your shoes before you put them on to be sure there are no objects inside.  To break in new shoes, wear them for just a few hours a day. This prevents injuries on your feet. Wounds, scrapes, corns, and calluses  Check your feet daily for blisters, cuts, bruises, sores, and redness. If you cannot see the bottom of your feet, use a mirror or ask someone for help.  Do not cut corns or calluses or try to remove them with medicine.  If you  find a minor scrape, cut, or break in the skin on your feet, keep it and the skin around it clean and dry. You may clean these areas with mild soap and water. Do not clean the area with peroxide, alcohol, or iodine.  If you have a wound, scrape, corn, or callus on your foot, look at it several times a day to make sure it is healing and not infected. Check for: ? Redness, swelling, or pain. ? Fluid or blood. ? Warmth. ? Pus or a bad smell. General instructions  Do not cross your legs. This may decrease blood flow to your feet.  Do not use heating pads or hot water bottles on your feet. They may burn your skin. If you have lost feeling in your feet or legs, you may not know this is happening until it is too late.  Protect your feet from hot and cold by wearing shoes, such as at the beach or on hot pavement.  Schedule a complete foot exam at least once a year (annually) or more often if you have foot problems. If you have foot problems, report any cuts, sores, or bruises to your health care provider immediately. Contact a health care provider if:  You have a medical condition that increases your risk of infection and you have any cuts, sores, or bruises on your feet.  You have an injury that is not   healing.  You have redness on your legs or feet.  You feel burning or tingling in your legs or feet.  You have pain or cramps in your legs and feet.  Your legs or feet are numb.  Your feet always feel cold.  You have pain around a toenail. Get help right away if:  You have a wound, scrape, corn, or callus on your foot and: ? You have pain, swelling, or redness that gets worse. ? You have fluid or blood coming from the wound, scrape, corn, or callus. ? Your wound, scrape, corn, or callus feels warm to the touch. ? You have pus or a bad smell coming from the wound, scrape, corn, or callus. ? You have a fever. ? You have a red line going up your leg. Summary  Check your feet every day  for cuts, sores, red spots, swelling, and blisters.  Moisturize feet and legs daily.  Wear shoes that fit properly and have enough cushioning.  If you have foot problems, report any cuts, sores, or bruises to your health care provider immediately.  Schedule a complete foot exam at least once a year (annually) or more often if you have foot problems. This information is not intended to replace advice given to you by your health care provider. Make sure you discuss any questions you have with your health care provider. Document Revised: 06/14/2019 Document Reviewed: 10/23/2016 Elsevier Patient Education  2020 [employer]  Peripheral Neuropathy Peripheral neuropathy is a type of nerve damage. It affects nerves that carry signals between the spinal cord and the arms, legs, and the rest of the body (peripheral nerves). It does not affect nerves in the spinal cord or brain. In peripheral neuropathy, one nerve or a group of nerves may be damaged. Peripheral neuropathy is a broad category that includes many specific nerve disorders, like diabetic neuropathy, hereditary neuropathy, and carpal tunnel syndrome. What are the causes? This condition may be caused by:  Diabetes. This is the most common cause of peripheral neuropathy.  Nerve injury.  Pressure or stress on a nerve that lasts a long time.  Lack (deficiency) of B vitamins. This can result from alcoholism, poor diet, or a restricted diet.  Infections.  Autoimmune diseases, such as rheumatoid arthritis and systemic lupus erythematosus.  Nerve diseases that are passed from parent to child (inherited).  Some medicines, such as cancer medicines (chemotherapy).  Poisonous (toxic) substances, such as lead and mercury.  Too little blood flowing to the legs.  Kidney disease.  Thyroid disease. In some cases, the cause of this condition is not known. What are the signs or symptoms? Symptoms of this condition depend on which of your  nerves is damaged. Common symptoms include:  Loss of feeling (numbness) in the feet, hands, or both.  Tingling in the feet, hands, or both.  Burning pain.  Very sensitive skin.  Weakness.  Not being able to move a part of the body (paralysis).  Muscle twitching.  Clumsiness or poor coordination.  Loss of balance.  Not being able to control your bladder.  Feeling dizzy.  Sexual problems. How is this diagnosed? Diagnosing and finding the cause of peripheral neuropathy can be difficult. Your health care provider will take your medical history and do a physical exam. A neurological exam will also be done. This involves checking things that are affected by your brain, spinal cord, and nerves (nervous system). For example, your health care provider will check your reflexes, how you move, and   what you can feel. You may have other tests, such as:  Blood tests.  Electromyogram (EMG) and nerve conduction tests. These tests check nerve function and how well the nerves are controlling the muscles.  Imaging tests, such as CT scans or MRI to rule out other causes of your symptoms.  Removing a small piece of nerve to be examined in a lab (nerve biopsy). This is rare.  Removing and examining a small amount of the fluid that surrounds the brain and spinal cord (lumbar puncture). This is rare. How is this treated? Treatment for this condition may involve:  Treating the underlying cause of the neuropathy, such as diabetes, kidney disease, or vitamin deficiencies.  Stopping medicines that can cause neuropathy, such as chemotherapy.  Medicine to relieve pain. Medicines may include: ? Prescription or over-the-counter pain medicine. ? Antiseizure medicine. ? Antidepressants. ? Pain-relieving patches that are applied to painful areas of skin.  Surgery to relieve pressure on a nerve or to destroy a nerve that is causing pain.  Physical therapy to help improve movement and  balance.  Devices to help you move around (assistive devices). Follow these instructions at home: Medicines  Take over-the-counter and prescription medicines only as told by your health care provider. Do not take any other medicines without first asking your health care provider.  Do not drive or use heavy machinery while taking prescription pain medicine. Lifestyle   Do not use any products that contain nicotine or tobacco, such as cigarettes and e-cigarettes. Smoking keeps blood from reaching damaged nerves. If you need help quitting, ask your health care provider.  Avoid or limit alcohol. Too much alcohol can cause a vitamin B deficiency, and vitamin B is needed for healthy nerves.  Eat a healthy diet. This includes: ? Eating foods that are high in fiber, such as fresh fruits and vegetables, whole grains, and beans. ? Limiting foods that are high in fat and processed sugars, such as fried or sweet foods. General instructions   If you have diabetes, work closely with your health care provider to keep your blood sugar under control.  If you have numbness in your feet: ? Check every day for signs of injury or infection. Watch for redness, warmth, and swelling. ? Wear padded socks and comfortable shoes. These help protect your feet.  Develop a good support system. Living with peripheral neuropathy can be stressful. Consider talking with a mental health specialist or joining a support group.  Use assistive devices and attend physical therapy as told by your health care provider. This may include using a walker or a cane.  Keep all follow-up visits as told by your health care provider. This is important. Contact a health care provider if:  You have new signs or symptoms of peripheral neuropathy.  You are struggling emotionally from dealing with peripheral neuropathy.  Your pain is not well-controlled. Get help right away if:  You have an injury or infection that is not healing  normally.  You develop new weakness in an arm or leg.  You fall frequently. Summary  Peripheral neuropathy is when the nerves in the arms, or legs are damaged, resulting in numbness, weakness, or pain.  There are many causes of peripheral neuropathy, including diabetes, pinched nerves, vitamin deficiencies, autoimmune disease, and hereditary conditions.  Diagnosing and finding the cause of peripheral neuropathy can be difficult. Your health care provider will take your medical history, do a physical exam, and do tests, including blood tests and nerve function tests.    Treatment involves treating the underlying cause of the neuropathy and taking medicines to help control pain. Physical therapy and assistive devices may also help. This information is not intended to replace advice given to you by your health care provider. Make sure you discuss any questions you have with your health care provider. Document Revised: 09/03/2017 Document Reviewed: 11/30/2016 Elsevier Patient Education  2020 Elsevier Inc.  

## 2020-01-25 ENCOUNTER — Encounter: Payer: Medicaid Other | Admitting: Obstetrics and Gynecology

## 2020-01-26 ENCOUNTER — Ambulatory Visit: Payer: Medicaid Other | Admitting: Physical Therapy

## 2020-01-26 ENCOUNTER — Encounter: Payer: Self-pay | Admitting: Physical Therapy

## 2020-01-26 ENCOUNTER — Other Ambulatory Visit: Payer: Self-pay

## 2020-01-26 DIAGNOSIS — M6281 Muscle weakness (generalized): Secondary | ICD-10-CM | POA: Diagnosis not present

## 2020-01-26 DIAGNOSIS — R262 Difficulty in walking, not elsewhere classified: Secondary | ICD-10-CM | POA: Diagnosis not present

## 2020-01-26 DIAGNOSIS — R2681 Unsteadiness on feet: Secondary | ICD-10-CM

## 2020-01-27 NOTE — Therapy (Signed)
[facility] [address] [place], [place], [address] Phone: [phone]   Fax:  [phone]  Physical Therapy Treatment  Patient Details  Name: Robin [name] MRN: [id number] Date of Birth: [birth year] Referring Provider (PT): Karle Plumber, MD   Encounter Date: 01/26/2020  PT End of Session - 01/26/20 1329    Visit Number  11    Number of Visits  12    Date for PT Re-Evaluation  02/06/20    Authorization Type  Medicaid    Authorization Time Period  4 visits 01/10/20-02/06/2020 (this was based on request to extend date for 4 visits not previously used)    Authorization - Visit Number  3    Authorization - Number of Visits  4    PT Start Time  1322   pt running late for appt   PT Stop Time  1358    PT Time Calculation (min)  36 min    Equipment Utilized During Treatment  Other (comment)   RW with transfers   Activity Tolerance  Patient limited by pain;Patient tolerated treatment well;Patient limited by fatigue    Behavior During Therapy  [facility] for tasks assessed/performed       Past Medical History:  Diagnosis Date  . Anxiety disorder   . Arthritis   . Asthma   . Chronic neck pain   . Constipation   . Depression   . Diabetes mellitus without complication ([place])   . Dyspnea   . GERD (gastroesophageal reflux disease)   . Hiatal hernia    small  . Hyperlipidemia   . Hypertension   . Hypothyroidism   . Schatzki's ring    non critical  . Sleep apnea    CPAP, Sleep study at [place]  . Sleep apnea     Past Surgical History:  Procedure Laterality Date  . ABDOMINAL HYSTERECTOMY    . ABDOMINAL HYSTERECTOMY  02/18/2000  . CARDIAC CATHETERIZATION  08/18/2003   normal L main/LAD/L Cfx/RCA (Dr. Adora Fridge)  . COLONOSCOPY  2006   Dr. [name], hyperplastic polyps  . COLONOSCOPY  08/06/11   abnormal terminal ileum for 10cm, erosions, geographical ulceration. Bx small bowel mucosa with prominent  intramucosal lymphoid aggregates, slightly inflammed  . COLONOSCOPY WITH PROPOFOL N/A 12/28/2019   Procedure: COLONOSCOPY WITH PROPOFOL;  Surgeon: [name], MD;  [facility];  Service: Endoscopy;  Laterality: N/A;  8:30am  . DIRECT LARYNGOSCOPY  03/15/2012   Procedure: DIRECT LARYNGOSCOPY;  Surgeon: [name], MD;  Location: [facility];  Service: ENT;  Laterality: N/A; Dr. Wolicki--:>no foreign body seen. normal esophagus to 40cm  . ESOPHAGEAL DILATION  04/17/2015   Procedure: ESOPHAGEAL DILATION;  Surgeon: [name], MD;  [facility];  Service: Endoscopy;;  . ESOPHAGOGASTRODUODENOSCOPY  08/23/2008   QPR:FFMB distal esophageal erosions consistent with mild erosive reflux esophagitis, otherwise unremarkable esophagus/ Tiny antral erosions of doubtful clinical significance, otherwise normal stomach, patent pylorus, normal D1 and D2  . ESOPHAGOGASTRODUODENOSCOPY  08/06/11   small hh, noncritical Schatzki's ring s/p 15 F  . ESOPHAGOGASTRODUODENOSCOPY N/A 04/17/2015   WGY:KZLDJT s/p dilation  . ESOPHAGOGASTRODUODENOSCOPY (EGD) WITH PROPOFOL N/A 01/20/2018   Dr. [name]: Erosive gastropathy, normal-appearing esophagus status post empiric dilation.  Chronic gastritis, no H. pylori.  . ESOPHAGOSCOPY  03/15/2012   Procedure: ESOPHAGOSCOPY;  Surgeon: [name], MD;  Location: [facility];  Service: ENT;  Laterality: N/A;  . KNEE ARTHROSCOPY  04/27/2001  .  MALONEY DILATION N/A 01/20/2018   Procedure: Venia Minks DILATION;  Surgeon: [name], MD;  [facility];  Service: Endoscopy;  Laterality: N/A;  . [place]  2003   negative bruce protocol exercise tress test; EF 68%; intermediate risk study due to evidence of anterior wall ischemia extending from mid-ventricle to apex  . PITUITARY SURGERY  06/2012   benign tumor, surgeon at [facility]  . POLYPECTOMY  12/28/2019   Procedure: POLYPECTOMY;  Surgeon: [name], MD;  [facility];   Service: Endoscopy;;  . RECTOCELE REPAIR    . TRANSTHORACIC ECHOCARDIOGRAM  2003   EF normal  . VAGINAL PROLAPSE REPAIR    . VIDEO BRONCHOSCOPY Bilateral 03/08/2017   Procedure: VIDEO BRONCHOSCOPY WITHOUT FLUORO;  Surgeon: Javier Glazier, MD;  Location: [facility];  Service: Cardiopulmonary;  Laterality: Bilateral;    There were no vitals filed for this visit.  Subjective Assessment - 01/26/20 1328    Subjective  No new complaints. No falls.    Pertinent History  PMH: HTN, pre-DM, OSA, fibromyalgia, depression, obesity,  GERD, hypothyroid, OA of multiple js (hands, knees, shoulders)MRI pituitary adenoma: stable; pinched nerve in low back    Limitations  Walking;Standing    How long can you stand comfortably?  5 min    How long can you walk comfortably?  Walks if she needs to; always having pain    Patient Stated Goals  Pt's goal for therapy is to try to get around better than I am now.    Currently in Pain?  Yes    Pain Location  Generalized   shoulders, hips, knees   Pain Orientation  Right;Left   whole body   Pain Descriptors / Indicators  Aching;Discomfort    Pain Type  Chronic pain    Pain Onset  More than a month ago    Pain Frequency  Constant    Aggravating Factors   immobility, increaseds standing/walking    Pain Relieving Factors  medication helps, resting, exercises help some            [facility] Adult PT Treatment/Exercise - 01/26/20 1331      Transfers   Transfers  Sit to Stand;Stand to [employer] Transfers    Sit to Stand  5: Supervision;With upper extremity assist;From chair/3-in-1;From bed    Stand to Sit  5: Supervision;With upper extremity assist;To chair/3-in-1;To bed      Ambulation/Gait   Ambulation/Gait  Yes    Ambulation/Gait Assistance  4: Min guard    Ambulation/Gait Assistance Details  cues for posture and walker position.    Ambulation Distance (Feet)  45 Feet   x1   Assistive device  Rolling walker    Gait Pattern  Step-through  pattern;Decreased step length - right;Decreased step length - left;Decreased hip/knee flexion - right;Decreased hip/knee flexion - left;Decreased dorsiflexion - right;Decreased dorsiflexion - left;Right flexed knee in stance;Left flexed knee in stance;Trunk flexed;Poor foot clearance - left;Poor foot clearance - right;Antalgic    Ambulation Surface  Level;Indoor      Exercises   Exercises  Other Exercises    Other Exercises   at parallel bars: heel raises for 10 reps, mini squats for 10 reps with short seated rest break needed at this time. with second stand at bars pt performed alternating hip abduction for 10 reps each side. cues needed for posture and ex' form/technique.      Knee/Hip Exercises: Aerobic   Nustep  level 3.0 for  5 minutes with all 4 extremities, rest for 3 minutes, then 5 more minutes of work. goal >/= 40 steps per minute each set of work for strengthening/activity tolerance. VVS with Nustep.           PT Short Term Goals - 11/15/19 2542      PT SHORT TERM GOAL #1   Title  Pt will be independent with HEP to address strength, flexibility, balance, and gait progression.  TARGET 11/03/2019    Baseline  11/15/19: met with curent ex program    Status  Achieved    Target Date  11/03/19      PT SHORT TERM GOAL #2   Title  Pt will be ambulate at least 15 ft using RW (vs cane), min assist for improved gait within home.    Baseline  11/15/19: 20 feet with wide RW min gaurd to min assist    Time  --    Period  --    Status  Achieved    Target Date  11/03/19      PT SHORT TERM GOAL #3   Title  Pt will perform sit<>stand with minimal assistance for improved transfer efficiency and safety.    Baseline  11/15/19: met in session today    Time  --    Period  --    Status  Achieved    Target Date  11/03/19        PT Long Term Goals - 01/10/20 1911      PT LONG TERM GOAL #1   Title  Pt will verbalize plans for continued fitness upon d/c from PT.  TARGET 02/06/2020    Baseline   independent with current HEP 01/10/2020; continued fatigue and decreased standing tolerance due to BLE weakness and pain    Time  4    Period  Weeks    Status  Revised      PT LONG TERM GOAL #2   Title  Pt will ambulate at least 60 ft using RW with supervision, for improved gait efficiency and safety in the home.    Baseline  40 ft 01/10/2020 with min guard and RW    Time  4    Period  Weeks    Status  Revised      PT LONG TERM GOAL #3   Title  Pt will negotiate 3 steps with 1 handrail, cane, and min guard assistance, to simulate stairs at home.    Baseline  in PT session, pt performs 1 step with rail and cane with min assistance. 01/10/2020    Time  4    Period  Weeks    Status  Revised      PT LONG TERM GOAL #4   Title  Pt will perform at least 8 minutes of standing activities with UE support, for improved participation in ADLs and household tasks.    Baseline  In clinic during PT session, 2-4 minutes of standing/gait, limited due to pain    Time  4    Period  Weeks    Status  Revised            Plan - 01/26/20 1331    Clinical Impression Statement  Today's skilled session continued to address strengthening and gait. Pt was able to increase her gait distance today.    Personal Factors and Comorbidities  Comorbidity 3+    Comorbidities  HTN, pre-DM, OSA, fibromyalgia, depression, obesity,  GERD, hypothyroid, OA of multiple js (hands, knees, shoulders)MRI  pituitary adenoma: stable    Examination-Participation Restrictions  Community Activity;Meal Prep;Other   standing ADL's   Stability/Clinical Decision Making  Evolving/Moderate complexity    Rehab Potential  Good    PT Frequency  1x / week    PT Duration  4 weeks    PT Treatment/Interventions  ADLs/Self Care Home Management;Aquatic Therapy;Gait training;Stair training;Functional mobility training;Therapeutic activities;Therapeutic exercise;Balance training;Neuromuscular re-education;Patient/family education    PT Next Visit  Plan  check goals    PT Home Exercise Plan  Access Code: [email or web address]    Consulted and Agree with Plan of Care  Patient       Patient will benefit from skilled therapeutic intervention in order to improve the following deficits and impairments:  Abnormal gait, Decreased range of motion, Difficulty walking, Decreased endurance, Decreased activity tolerance, Pain, Decreased balance, Impaired flexibility, Decreased mobility, Decreased strength  Visit Diagnosis: Difficulty in walking, not elsewhere classified  Muscle weakness (generalized)  Unsteadiness on feet     Problem List Patient Active Problem List   Diagnosis Date Noted  . Multilevel degenerative disc disease 10/31/2018  . Abdominal pain 10/28/2018  . Change in bowel function 10/28/2018  . Gastritis and gastroduodenitis 04/26/2018  . Atrophic vaginitis 04/01/2018  . Diabetes mellitus ([place]) 03/31/2018  . Upper airway cough syndrome 02/08/2018  . Dysphagia 12/14/2017  . 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 Urinary incontinence 07/23/2017  . Early satiety 06/16/2017  . Hx of iron deficiency anemia 05/28/2017  . Throat pain in adult 04/05/2017  . Prediabetes 02/04/2017  . Dyspnea on exertion 12/23/2016  . Chronic allergic rhinitis 12/23/2016  . Hemoptysis 12/23/2016  . Fibromyalgia 08/18/2016  . Chronic lymphocytic thyroiditis 08/18/2016  . Depression 04/01/2016  . OSA (obstructive sleep apnea) 04/01/2016  . Essential hypertension 12/09/2015  . Hypothyroidism 12/09/2015  . Morbid obesity due to excess calories ([place]) 12/09/2015  . Constipation 05/27/2015  . Fatty liver 05/27/2015  . Abdominal pain, chronic, epigastric 07/04/2012  . Anxiety 06/09/2012  . History of gastroesophageal reflux (GERD) 06/09/2012  . Hyperlipidemia 06/09/2012  . Refusal of blood transfusions as patient is Jehovah's Witness 06/09/2012  . 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 Abnormal small bowel biopsy 09/18/2011  . Lactose intolerance 09/18/2011   . GERD 01/21/2010    Willow Ora, PTA, [facility] [address], [place], [place] [address] [phone] 01/27/20, 5:24 PM   Name: Robin [name] MRN: [id number] Date of Birth: Jan 25, 1956

## 2020-01-28 NOTE — Progress Notes (Signed)
Subjective: Robin [name] presents today for follow up of preventative diabetic foot care and painful mycotic nails b/l that are difficult to trim. Pain interferes with ambulation. Aggravating factors include wearing enclosed shoe gear. Pain is relieved with periodic professional debridement.   Allergies  Allergen Reactions  . Celexa [Citalopram Hydrobromide] Other (See Comments)    "Made me feel out of it"  . Gabapentin     Contributed to lower extremity edema  . Norvasc [Amlodipine Besylate]     Edema in lower extremities  . Diflucan [Fluconazole] Rash     Objective: Vitals:   01/24/20 1015  Temp: (!) 96.4 F (35.8 C)    Pt 64 y.o. year old female  in NAD. AAO x 3.   Vascular Examination:  Capillary refill time to digits immediate b/l. Palpable DP pulses b/l. Palpable PT pulses b/l. Pedal hair absent b/l Skin temperature gradient within normal limits b/l. No edema noted b/l.  Dermatological Examination: Pedal skin with normal turgor, texture and tone bilaterally. No open wounds bilaterally. No interdigital macerations bilaterally. Toenails 1-5 b/l elongated, dystrophic, thickened, crumbly with subungual debris and tenderness to dorsal palpation.  Musculoskeletal: Normal muscle strength 5/5 to all lower extremity muscle groups bilaterally, Flaccid lower extremities noted b/l, no pain crepitus or joint limitation noted with ROM b/l and pes planus deformity noted  Neurological: Protective sensation intact 5/5 intact bilaterally with 10g monofilament b/l. Vibratory sensation intact b/l. Proprioception intact bilaterally. Babinski reflex negative b/l. Clonus negative b/l.  Assessment: 1. Pain due to onychomycosis of toenails of both feet   2. Type 2 diabetes mellitus without complication, unspecified whether long term insulin use ([place])    Plan: -Continue diabetic foot care principles. Literature dispensed on today.  -Toenails 1-5 b/l were debrided in length and girth with  sterile nail nippers and dremel without iatrogenic bleeding.  -Patient to continue soft, supportive shoe gear daily. -Patient to report any pedal injuries to medical professional immediately. -Patient/POA to call should there be question/concern in the interim.  Return in about 3 months (around 04/24/2020) for diabetic nail trim.

## 2020-01-30 ENCOUNTER — Ambulatory Visit: Payer: Medicaid Other | Admitting: Physical Therapy

## 2020-01-30 ENCOUNTER — Other Ambulatory Visit: Payer: Self-pay

## 2020-01-30 ENCOUNTER — Encounter: Payer: Self-pay | Admitting: Physical Therapy

## 2020-01-30 DIAGNOSIS — M6281 Muscle weakness (generalized): Secondary | ICD-10-CM

## 2020-01-30 DIAGNOSIS — R262 Difficulty in walking, not elsewhere classified: Secondary | ICD-10-CM | POA: Diagnosis not present

## 2020-01-30 DIAGNOSIS — R2681 Unsteadiness on feet: Secondary | ICD-10-CM

## 2020-01-30 NOTE — Therapy (Signed)
[facility] [address] [place], [place], [address] Phone: [phone]   Fax:  [phone]  Physical Therapy Treatment  Patient Details  Name: Robin [name] MRN: [id number] Date of Birth: 12-11-1955 Referring Provider (PT): Karle Plumber, MD   Encounter Date: 01/30/2020  PT End of Session - 01/30/20 1535    Visit Number  12    Number of Visits  16   per recert 9/35/7017   Date for PT Re-Evaluation  79/39/03   60 day cert for 4 week POC   Authorization Type  Medicaid    Authorization Time Period  4 visits 01/10/20-02/06/2020 -Requested 4 additional visits, 01/30/2020    Authorization - Visit Number  4    Authorization - Number of Visits  4    PT Start Time  0092    PT Stop Time  1316    PT Time Calculation (min)  43 min    Equipment Utilized During Treatment  Other (comment)   RW with transfers   Activity Tolerance  Patient limited by pain;Patient tolerated treatment well;Patient limited by fatigue    Behavior During Therapy  [facility] for tasks assessed/performed       Past Medical History:  Diagnosis Date  . Anxiety disorder   . Arthritis   . Asthma   . Chronic neck pain   . Constipation   . Depression   . Diabetes mellitus without complication ([place])   . Dyspnea   . GERD (gastroesophageal reflux disease)   . Hiatal hernia    small  . Hyperlipidemia   . Hypertension   . Hypothyroidism   . Schatzki's ring    non critical  . Sleep apnea    CPAP, Sleep study at [place]  . Sleep apnea     Past Surgical History:  Procedure Laterality Date  . ABDOMINAL HYSTERECTOMY    . ABDOMINAL HYSTERECTOMY  02/18/2000  . CARDIAC CATHETERIZATION  08/18/2003   normal L main/LAD/L Cfx/RCA (Dr. Adora Fridge)  . COLONOSCOPY  2006   Dr. [name], hyperplastic polyps  . COLONOSCOPY  08/06/11   abnormal terminal ileum for 10cm, erosions, geographical ulceration. Bx small bowel mucosa with prominent intramucosal  lymphoid aggregates, slightly inflammed  . COLONOSCOPY WITH PROPOFOL N/A 12/28/2019   Procedure: COLONOSCOPY WITH PROPOFOL;  Surgeon: [name], MD;  [facility];  Service: Endoscopy;  Laterality: N/A;  8:30am  . DIRECT LARYNGOSCOPY  03/15/2012   Procedure: DIRECT LARYNGOSCOPY;  Surgeon: [name], MD;  Location: [place];  Service: ENT;  Laterality: N/A; Dr. Wolicki--:>no foreign body seen. normal esophagus to 40cm  . ESOPHAGEAL DILATION  04/17/2015   Procedure: ESOPHAGEAL DILATION;  Surgeon: [name], MD;  [facility];  Service: Endoscopy;;  . ESOPHAGOGASTRODUODENOSCOPY  08/23/2008   ZRA:QTMA distal esophageal erosions consistent with mild erosive reflux esophagitis, otherwise unremarkable esophagus/ Tiny antral erosions of doubtful clinical significance, otherwise normal stomach, patent pylorus, normal D1 and D2  . ESOPHAGOGASTRODUODENOSCOPY  08/06/11   small hh, noncritical Schatzki's ring s/p 62 F  . ESOPHAGOGASTRODUODENOSCOPY N/A 04/17/2015   UQJ:FHLKTG s/p dilation  . ESOPHAGOGASTRODUODENOSCOPY (EGD) WITH PROPOFOL N/A 01/20/2018   Dr. [name]: Erosive gastropathy, normal-appearing esophagus status post empiric dilation.  Chronic gastritis, no H. pylori.  . ESOPHAGOSCOPY  03/15/2012   Procedure: ESOPHAGOSCOPY;  Surgeon: [name], MD;  Location: [place];  Service: ENT;  Laterality: N/A;  . KNEE ARTHROSCOPY  04/27/2001  . MALONEY DILATION N/A  01/20/2018   Procedure: Venia Minks DILATION;  Surgeon: [name], MD;  [facility];  Service: Endoscopy;  Laterality: N/A;  . [place]  2003   negative bruce protocol exercise tress test; EF 68%; intermediate risk study due to evidence of anterior wall ischemia extending from mid-ventricle to apex  . PITUITARY SURGERY  06/2012   benign tumor, surgeon at [facility]  . POLYPECTOMY  12/28/2019   Procedure: POLYPECTOMY;  Surgeon: [name], MD;  [facility];  Service: Endoscopy;;   . RECTOCELE REPAIR    . TRANSTHORACIC ECHOCARDIOGRAM  2003   EF normal  . VAGINAL PROLAPSE REPAIR    . VIDEO BRONCHOSCOPY Bilateral 03/08/2017   Procedure: VIDEO BRONCHOSCOPY WITHOUT FLUORO;  Surgeon: Javier Glazier, MD;  Location: [facility] [place];  Service: Cardiopulmonary;  Laterality: Bilateral;    There were no vitals filed for this visit.  Subjective Assessment - 01/30/20 1235    Subjective  Nothing new.  It's a good day with sunshine.  Try to walk when I first get up; do some exercise before breakfast and before bedtime.  Try to stand for a few minutes (probably 3-5 minutes) in the kitchen.    Pertinent History  PMH: HTN, pre-DM, OSA, fibromyalgia, depression, obesity,  GERD, hypothyroid, OA of multiple js (hands, knees, shoulders)MRI pituitary adenoma: stable; pinched nerve in low back    Limitations  Walking;Standing    How long can you stand comfortably?  --    How long can you walk comfortably?  Walks if she needs to; always having pain    Patient Stated Goals  Pt's goal for therapy is to try to get around better than I am now.    Currently in Pain?  Yes    Pain Score  8     Pain Location  Generalized   Knees, hip   Pain Orientation  Right;Left    Pain Descriptors / Indicators  Aching    Pain Type  Chronic pain    Pain Onset  More than a month ago    Pain Frequency  Constant    Aggravating Factors   immobility, weather    Pain Relieving Factors  medication helps, resting, exercises help some                       [facility] Adult PT Treatment/Exercise - 01/30/20 0001      Transfers   Transfers  Sit to Stand;Stand to Sit;Stand Pivot Transfers    Sit to Stand  5: Supervision;With upper extremity assist;From chair/3-in-1;From bed    Stand to Sit  5: Supervision;With upper extremity assist;To chair/3-in-1;To bed    Stand Pivot Transfers  4: Min guard    Stand Pivot Transfer Details (indicate cue type and reason)  pt's scooter to NuStep seat       Ambulation/Gait   Ambulation/Gait  Yes    Ambulation/Gait Assistance  4: Min guard    Ambulation/Gait Assistance Details  Cues for posture, walker to stay close    Ambulation Distance (Feet)  60 Feet    Assistive device  Rolling walker    Gait Pattern  Step-through pattern;Decreased step length - right;Decreased step length - left;Decreased hip/knee flexion - right;Decreased hip/knee flexion - left;Decreased dorsiflexion - right;Decreased dorsiflexion - left;Right flexed knee in stance;Left flexed knee in stance;Trunk flexed;Poor foot clearance - left;Poor foot clearance - right;Antalgic    Ambulation Surface  Level;Indoor    Stairs  Yes  Stairs Assistance  4: Min assist   Tech present as second person if needed-supervision only   [employer] Details (indicate cue type and reason)  Pt uses cane in R hand, L handrail    Stair Management Technique  One rail Left;Step to pattern;Forwards   cane in RUE   Number of Stairs  4    Height of Stairs  6    Gait Comments  After gait (and after NuStep and stairs activity), pt requires several minutes of cued pursed lip breathing to slow breathing rate.  O2 sats 94%, HR 98 bpm      Self-Care   Self-Care  Other Self-Care Comments    Other Self-Care Comments   Discussed progress towards goals, POC.  Pt would like to continue to work on standing balance, standing activities to translate to home.        Knee/Hip Exercises: Aerobic   Nustep  At beginning of session, level 3.0 for 7 minutes with all 4 extremities, rest for 3 minutes, 65-70 steps per minute each set of work for strengthening/activity tolerance. VVS with Nustep.                PT Short Term Goals - 11/15/19 7672      PT SHORT TERM GOAL #1   Title  Pt will be independent with HEP to address strength, flexibility, balance, and gait progression.  TARGET 11/03/2019    Baseline  11/15/19: met with curent ex program    Status  Achieved    Target Date  11/03/19      PT SHORT TERM  GOAL #2   Title  Pt will be ambulate at least 15 ft using RW (vs cane), min assist for improved gait within home.    Baseline  11/15/19: 20 feet with wide RW min gaurd to min assist    Time  --    Period  --    Status  Achieved    Target Date  11/03/19      PT SHORT TERM GOAL #3   Title  Pt will perform sit<>stand with minimal assistance for improved transfer efficiency and safety.    Baseline  11/15/19: met in session today    Time  --    Period  --    Status  Achieved    Target Date  11/03/19        PT Long Term Goals - 01/30/20 1537      PT LONG TERM GOAL #1   Title  Pt will verbalize plans for continued fitness upon d/c from PT.  TARGET 02/06/2020    Baseline  01/30/2020-has been looking for a recumbent bike for home; continues to perform HEP (mostly seated)    Time  4    Period  Weeks    Status  Not Met      PT LONG TERM GOAL #2   Title  Pt will ambulate at least 60 ft using RW with supervision, for improved gait efficiency and safety in the home.    Baseline  Met 01/30/2020    Time  4    Period  Weeks    Status  Achieved      PT LONG TERM GOAL #3   Title  Pt will negotiate 3 steps with 1 handrail, cane, and min guard assistance, to simulate stairs at home.    Baseline  Met 01/30/2020    Time  4    Period  Weeks    Status  Achieved  PT LONG TERM GOAL #4   Title  Pt will perform at least 8 minutes of standing activities with UE support, for improved participation in ADLs and household tasks.    Baseline  In clinic during PT session, 2-4 minutes of standing/gait, limited due to pain; reports 3-5 minutes of standing at home    Time  4    Period  Weeks    Status  Not Met            Plan - 01/30/20 1540    Clinical Impression Statement  Assessed LTGs this visit, with pt meeting 2 of 4 LTGs.  LTG 1 not met for progression to ongoing fitness plans (pt may continue with PT to progress HEP); LTG 2 and 3 met for gait distance and stairs; LTG 4 not met for standing  tolerance, as pt reports 3-5 minutes of standing at home, limited due to hip and knee pain.  Pt is progressing and remains motivated for therapy participation to improve overall funcitonal mobility.  Muscle stiffness, pain, and decreased leg strength continue to contribute to standing limitations, but pt would benefit from additional skilled PT to further address stregnth, flexibility, and standing balance/tolerance for improved participation in ADLs and household activities.    Personal Factors and Comorbidities  Comorbidity 3+    Comorbidities  HTN, pre-DM, OSA, fibromyalgia, depression, obesity,  GERD, hypothyroid, OA of multiple js (hands, knees, shoulders)MRI pituitary adenoma: stable    Examination-Participation Restrictions  Community Activity;Meal Prep;Other   standing ADL's   Stability/Clinical Decision Making  Evolving/Moderate complexity    Rehab Potential  Good    PT Frequency  1x / week    PT Duration  4 weeks   per recert 10/07/1592   PT Treatment/Interventions  ADLs/Self Care Home Management;Aquatic Therapy;Gait training;Stair training;Functional mobility training;Therapeutic activities;Therapeutic exercise;Balance training;Neuromuscular re-education;Patient/family education    PT Next Visit Plan  REcert completed 5/85/9292 and goals updated; try to include additional standing exercises in HEP that pt may do at home; also consider stretching    PT Home Exercise Plan  Access Code: [email or web address]    Consulted and Agree with Plan of Care  Patient       Patient will benefit from skilled therapeutic intervention in order to improve the following deficits and impairments:  Abnormal gait, Decreased range of motion, Difficulty walking, Decreased endurance, Decreased activity tolerance, Pain, Decreased balance, Impaired flexibility, Decreased mobility, Decreased strength  Visit Diagnosis: Difficulty in walking, not elsewhere classified  Unsteadiness on feet  Muscle weakness  (generalized)     Problem List Patient Active Problem List   Diagnosis Date Noted  . Multilevel degenerative disc disease 10/31/2018  . Abdominal pain 10/28/2018  . Change in bowel function 10/28/2018  . Gastritis and gastroduodenitis 04/26/2018  . Atrophic vaginitis 04/01/2018  . Diabetes mellitus ([place]) 03/31/2018  . Upper airway cough syndrome 02/08/2018  . Dysphagia 12/14/2017  . 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 Urinary incontinence 07/23/2017  . Early satiety 06/16/2017  . Hx of iron deficiency anemia 05/28/2017  . Throat pain in adult 04/05/2017  . Prediabetes 02/04/2017  . Dyspnea on exertion 12/23/2016  . Chronic allergic rhinitis 12/23/2016  . Hemoptysis 12/23/2016  . Fibromyalgia 08/18/2016  . Chronic lymphocytic thyroiditis 08/18/2016  . Depression 04/01/2016  . OSA (obstructive sleep apnea) 04/01/2016  . Essential hypertension 12/09/2015  . Hypothyroidism 12/09/2015  . Morbid obesity due to excess calories ([place]) 12/09/2015  . Constipation 05/27/2015  . Fatty liver 05/27/2015  . Abdominal pain, chronic, epigastric  07/04/2012  . Anxiety 06/09/2012  . History of gastroesophageal reflux (GERD) 06/09/2012  . Hyperlipidemia 06/09/2012  . Refusal of blood transfusions as patient is Jehovah's Witness 06/09/2012  . 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 Abnormal small bowel biopsy 09/18/2011  . Lactose intolerance 09/18/2011  . GERD 01/21/2010    Johnae Friley W. 01/30/2020, 3:49 PM  Frazier Butt., PT   [facility] [address] [place], [place], [address] Phone: [phone]   Fax:  [phone]  Name: Robin [name] MRN: [id number] Date of Birth: January 26, 1956   New goals for recert: PT Long Term Goals - 01/30/20 1550      PT LONG TERM GOAL #1   Title  Pt will be independent with progression of final HEP.  TARGET 03/08/2020    Baseline  01/30/2020-has been looking for a recumbent bike for home;  continues to perform HEP (mostly seated)    Time  4    Period  Weeks    Status  Revised      PT LONG TERM GOAL #2   Title  Pt will perform standing activities with 1-2 UE support for 5 minutes for improved standing participation in ADLs.    Baseline  2-4 minutes standing in PT sessions; 3-5 minutes standing at home.    Time  4    Period  Weeks    Status  Revised      PT LONG TERM GOAL #3   Title  Pt will report ambulating at least 3 short bouts of gait (15-20 ft) at home each day, as part of short distance gait in the home.    Baseline  pt reports only walking in the mornings at home    Time  [place], [place] 01/30/20 3:55 PM Phone: [phone] Fax: [phone]

## 2020-01-31 ENCOUNTER — Other Ambulatory Visit: Payer: Self-pay | Admitting: Internal Medicine

## 2020-01-31 DIAGNOSIS — J309 Allergic rhinitis, unspecified: Secondary | ICD-10-CM

## 2020-01-31 DIAGNOSIS — I10 Essential (primary) hypertension: Secondary | ICD-10-CM

## 2020-01-31 MED FILL — FAMOTIDINE 20 MG TABS: 20 | 30 days supply | Qty: 30 | Fill #2

## 2020-01-31 MED FILL — CELECOXIB 100 MG CAP: 100 | 30 days supply | Qty: 60 | Fill #0

## 2020-01-31 MED FILL — hydrALAZINE HCL 50 MG TABS: 50 | 30 days supply | Qty: 90 | Fill #1

## 2020-01-31 MED FILL — ATORVASTATIN CALCIUM 20 MG: 20 | 30 days supply | Qty: 15 | Fill #5

## 2020-01-31 MED FILL — METFORMIN HCL 500 MG TABS: 500 | 30 days supply | Qty: 15 | Fill #2

## 2020-02-01 ENCOUNTER — Encounter (HOSPITAL_BASED_OUTPATIENT_CLINIC_OR_DEPARTMENT_OTHER): Payer: Self-pay | Admitting: Cardiovascular Disease

## 2020-02-01 DIAGNOSIS — F331 Major depressive disorder, recurrent, moderate: Secondary | ICD-10-CM | POA: Diagnosis not present

## 2020-02-01 NOTE — Procedures (Signed)
Patient Name: Robin [name], Robin [name] Date: 01/20/2020 Gender: Female D.O.B: 02-Oct-1956 Age (years): 63 Referring Provider: Lorretta Harp Height (inches): 68 Interpreting Physician: Shelva Majestic MD, ABSM Weight (lbs): 333 RPSGT: Zadie Rhine BMI: 51 MRN: [id number] Neck Size: 18.00  CLINICAL INFORMATION The patient is referred for a CPAP titration to treat sleep apnea.  Date of NPSG:  12/08/2019:  AHI 50.8/h; RDI 53.6/h: absent REM sleep: O2 nadir 83%; loud snoring  SLEEP STUDY TECHNIQUE As per the AASM Manual for the Scoring of Sleep and Associated Events v2.3 (April 2016) with a hypopnea requiring 4% desaturations.  The channels recorded and monitored were frontal, central and occipital EEG, electrooculogram (EOG), submentalis EMG (chin), nasal and oral airflow, thoracic and abdominal wall motion, anterior tibialis EMG, snore microphone, electrocardiogram, and pulse oximetry. Continuous positive airway pressure (CPAP) was initiated at the beginning of the study and titrated to treat sleep-disordered breathing.  MEDICATIONS ACCU-CHEK AVIVA PLUS test strip  acetaminophen (TYLENOL) 500 MG tablet  albuterol (VENTOLIN HFA) 108 (90 Base) MCG/ACT inhaler  amitriptyline (ELAVIL) 50 MG tablet  aspirin EC 81 MG tablet  atorvastatin (LIPITOR) 20 MG tablet  buPROPion (WELLBUTRIN XL) 300 MG 24 hr tablet  calcium carbonate (TUMS - DOSED IN MG ELEMENTAL CALCIUM) 500 MG chewable tablet  celecoxib (CELEBREX) 100 MG capsule  cetirizine (ZYRTEC) 10 MG tablet  chlorpheniramine (CHLOR-TRIMETON) 4 MG tablet  Cholecalciferol 1.25 MG (50000 UT) capsule  CINNAMON PO  DEXILANT 60 MG capsule  diclofenac sodium (VOLTAREN) 1 % GEL  DULoxetine (CYMBALTA) 30 MG capsule  famotidine (PEPCID) 20 MG tablet  fluticasone (FLONASE) 50 MCG/ACT nasal spray  furosemide (LASIX) 40 MG tablet  gabapentin (NEURONTIN) 300 MG capsule  glucose blood (ONETOUCH ULTRA) test strip  glucose blood test strip    glucose blood test strip  hydrALAZINE (APRESOLINE) 50 MG tablet  levothyroxine (SYNTHROID) 88 MCG tablet  LINZESS 290 MCG CAPS capsule  metFORMIN (GLUCOPHAGE) 500 MG tablet  Metoprolol Tartrate 75 MG TABS  montelukast (SINGULAIR) 10 MG tablet  Multiple Vitamin (MULTIVITAMIN WITH MINERALS) TABS tablet  Omega-3 Fatty Acids (FISH OIL) 1000 MG CAPS  OVER THE COUNTER MEDICATION  oxyCODONE-acetaminophen (PERCOCET) 10-325 MG tablet  polyethylene glycol-electrolytes (TRILYTE) 420 g solution  predniSONE (DELTASONE) 10 MG tablet  PREMARIN vaginal cream  sodium chloride (OCEAN) 0.65 % SOLN nasal spray  spironolactone (ALDACTONE) 25 MG tablet  vitamin B-12 (CYANOCOBALAMIN) 1000 MCG tablet   Medications self-administered by patient taken the night of the study : LIPITOR, CELEBREX, ELAVIL, PEPCID, CHLORPHENIRAMINE, NEURONTIN, CYMBALTA, METOPROLOL TARTRATE, APRESOLINE, MEGA OMEGA FISH OIL SOFTGELS, PERCOCET-5, SINGULAIR  TECHNICIAN COMMENTS Comments added by technician: NO RESTROOM VISTED Comments added by scorer: N/A  RESPIRATORY PARAMETERS Optimal PAP Pressure (cm): 19 AHI at Optimal Pressure (/hr): 0.6 Overall Minimal O2 (%): 83.0 Supine % at Optimal Pressure (%): 76 Minimal O2 at Optimal Pressure (%): 89.0   SLEEP ARCHITECTURE The study was initiated at 10:49:20 PM and ended at 4:42:22 AM.  Sleep onset time was 14.8 minutes and the sleep efficiency was 94.1%%. The total sleep time was 332.2 minutes.  The patient spent 2.1%% of the night in stage N1 sleep, 83.7%% in stage N2 sleep, 0.8%% in stage N3 and 13.4% in REM.Stage REM latency was 99.5 minutes  Wake after sleep onset was 6.0. Alpha intrusion was absent. Supine sleep was 48.76%.  CARDIAC DATA The 2 lead EKG demonstrated sinus rhythm. The mean heart rate was 62.3 beats per minute. Other EKG findings include: None.  LEG MOVEMENT DATA  The total Periodic Limb Movements of Sleep (PLMS) were 0. The PLMS index was 0.0. A PLMS index of  <15 is considered normal in adults.  IMPRESSIONS - CPAP was initiated at 6 cm and was titrated to optimal PAP pressure at 19 cm of water. AHI 0.6/h; O2 nadir 89%. REM sleep was present for 22 minutes at this pressure. - Central sleep apnea was not noted during this titration (CAI = 0.4/h). - Moderate oxygen desaturations to a nadir of  83% at  14 cm of water. - Resolution of prior loud snoring with CPAP - No cardiac abnormalities were observed during this study. - Clinically significant periodic limb movements were not noted during this study. Arousals associated with PLMs were rare.  DIAGNOSIS - Obstructive Sleep Apnea (327.23 [G47.33 ICD-10])  RECOMMENDATIONS - Recommend an initial trial of CPAP therapy with EPR of 3 at 19 cm H2O with heated humidification.  A Medium size Fisher[employer]Paykel Full Face Mask Simplus mask was used for the study. - Effort should be made to optimize nasal and oropharyngeal patency. - Avoid alcohol, sedatives and other CNS depressants that may worsen sleep apnea and disrupt normal sleep architecture. - Sleep hygiene should be reviewed to assess factors that may improve sleep quality. - Weight management and regular exercise should be initiated or continued. - Recommend a download in 30 days and sleep clinic evaluation after 4 weeks of therapy   [Electronically signed] 02/01/2020 06:58 PM  Shelva Majestic MD, [facility], ABSM Diplomate, American Board of Sleep Medicine   NPI: [id number]  [place] PH: [phone]   FX: [phone] [place]

## 2020-02-13 ENCOUNTER — Ambulatory Visit: Payer: Medicaid Other | Attending: Internal Medicine | Admitting: Physical Therapy

## 2020-02-13 ENCOUNTER — Encounter: Payer: Self-pay | Admitting: Physical Therapy

## 2020-02-13 ENCOUNTER — Other Ambulatory Visit: Payer: Self-pay

## 2020-02-13 DIAGNOSIS — R2681 Unsteadiness on feet: Secondary | ICD-10-CM | POA: Insufficient documentation

## 2020-02-13 DIAGNOSIS — M6281 Muscle weakness (generalized): Secondary | ICD-10-CM | POA: Insufficient documentation

## 2020-02-13 DIAGNOSIS — R262 Difficulty in walking, not elsewhere classified: Secondary | ICD-10-CM | POA: Diagnosis not present

## 2020-02-13 NOTE — Therapy (Signed)
[facility] [address] [place], [place], [address] Phone: [phone]   Fax:  [phone]  Physical Therapy Treatment  Patient Details  Name: Robin [name] MRN: [id number] Date of Birth: 01-26-1956 Referring Provider (PT): Karle Plumber, MD   Encounter Date: 02/13/2020  PT End of Session - 02/13/20 1025    Visit Number  13    Number of Visits  16   per recert 9/98/3382   Date for PT Re-Evaluation  50/53/97   60 day cert for 4 week POC   Authorization Type  Medicaid    Authorization Time Period  4 visits 01/10/20-02/06/2020 -Requested 4 additional visits, 01/30/2020; 4 visits from 02/13/20- 03/11/20    Authorization - Visit Number  1    Authorization - Number of Visits  4    PT Start Time  1019    PT Stop Time  1058    PT Time Calculation (min)  39 min    Equipment Utilized During Treatment  Other (comment)   RW with transfers   Activity Tolerance  Patient limited by pain;Patient tolerated treatment well;Patient limited by fatigue    Behavior During Therapy  [facility] for tasks assessed/performed       Past Medical History:  Diagnosis Date  . Anxiety disorder   . Arthritis   . Asthma   . Chronic neck pain   . Constipation   . Depression   . Diabetes mellitus without complication ([place])   . Dyspnea   . GERD (gastroesophageal reflux disease)   . Hiatal hernia    small  . Hyperlipidemia   . Hypertension   . Hypothyroidism   . Schatzki's ring    non critical  . Sleep apnea    CPAP, Sleep study at [facility]  . Sleep apnea     Past Surgical History:  Procedure Laterality Date  . ABDOMINAL HYSTERECTOMY    . ABDOMINAL HYSTERECTOMY  02/18/2000  . CARDIAC CATHETERIZATION  08/18/2003   normal L main/LAD/L Cfx/RCA (Dr. Adora Fridge)  . COLONOSCOPY  2006   Dr. [name], hyperplastic polyps  . COLONOSCOPY  08/06/11   abnormal terminal ileum for 10cm, erosions, geographical ulceration. Bx small bowel  mucosa with prominent intramucosal lymphoid aggregates, slightly inflammed  . COLONOSCOPY WITH PROPOFOL N/A 12/28/2019   Procedure: COLONOSCOPY WITH PROPOFOL;  Surgeon: [name], MD;  [facility];  Service: Endoscopy;  Laterality: N/A;  8:30am  . DIRECT LARYNGOSCOPY  03/15/2012   Procedure: DIRECT LARYNGOSCOPY;  Surgeon: [name], MD;  Location: [facility];  Service: ENT;  Laterality: N/A; Dr. Wolicki--:>no foreign body seen. normal esophagus to 40cm  . ESOPHAGEAL DILATION  04/17/2015   Procedure: ESOPHAGEAL DILATION;  Surgeon: [name], MD;  [facility];  Service: Endoscopy;;  . ESOPHAGOGASTRODUODENOSCOPY  08/23/2008   QBH:ALPF distal esophageal erosions consistent with mild erosive reflux esophagitis, otherwise unremarkable esophagus/ Tiny antral erosions of doubtful clinical significance, otherwise normal stomach, patent pylorus, normal D1 and D2  . ESOPHAGOGASTRODUODENOSCOPY  08/06/11   small hh, noncritical Schatzki's ring s/p 6 F  . ESOPHAGOGASTRODUODENOSCOPY N/A 04/17/2015   XTK:WIOXBD s/p dilation  . ESOPHAGOGASTRODUODENOSCOPY (EGD) WITH PROPOFOL N/A 01/20/2018   Dr. [name]: Erosive gastropathy, normal-appearing esophagus status post empiric dilation.  Chronic gastritis, no H. pylori.  . ESOPHAGOSCOPY  03/15/2012   Procedure: ESOPHAGOSCOPY;  Surgeon: [name], MD;  Location: [facility];  Service: ENT;  Laterality: N/A;  . KNEE ARTHROSCOPY  04/27/2001  .  MALONEY DILATION N/A 01/20/2018   Procedure: Venia Minks DILATION;  Surgeon: [name], MD;  [facility];  Service: Endoscopy;  Laterality: N/A;  . [place]  2003   negative bruce protocol exercise tress test; EF 68%; intermediate risk study due to evidence of anterior wall ischemia extending from mid-ventricle to apex  . PITUITARY SURGERY  06/2012   benign tumor, surgeon at [facility]  . POLYPECTOMY  12/28/2019   Procedure: POLYPECTOMY;  Surgeon: [name], MD;  [facility];  Service: Endoscopy;;  . RECTOCELE REPAIR    . TRANSTHORACIC ECHOCARDIOGRAM  2003   EF normal  . VAGINAL PROLAPSE REPAIR    . VIDEO BRONCHOSCOPY Bilateral 03/08/2017   Procedure: VIDEO BRONCHOSCOPY WITHOUT FLUORO;  Surgeon: Javier Glazier, MD;  Location: [facility];  Service: Cardiopulmonary;  Laterality: Bilateral;    There were no vitals filed for this visit.  Subjective Assessment - 02/13/20 1023    Subjective  Started new muscle relaxer a few weeks ago (can not recall which one). Does not feel it is helping, feels it's too low of a dose. No falls.    Pertinent History  PMH: HTN, pre-DM, OSA, fibromyalgia, depression, obesity,  GERD, hypothyroid, OA of multiple js (hands, knees, shoulders)MRI pituitary adenoma: stable; pinched nerve in low back    Limitations  Walking;Standing    How long can you stand comfortably?  5 min    How long can you walk comfortably?  Walks if she needs to; always having pain    Patient Stated Goals  Pt's goal for therapy is to try to get around better than I am now.    Currently in Pain?  Yes    Pain Score  7     Pain Location  Generalized   hips and knees   Pain Orientation  Right;Left    Pain Descriptors / Indicators  Aching;Sore    Pain Type  Chronic pain    Pain Onset  More than a month ago    Pain Frequency  Constant    Aggravating Factors   immobility, weather    Pain Relieving Factors  medication helps, resting, exercises help some                       [facility] Adult PT Treatment/Exercise - 02/13/20 1026      Transfers   Transfers  Sit to Stand;Stand to [employer] Transfers    Sit to Stand  5: Supervision;With upper extremity assist;From chair/3-in-1;From bed    Sit to Stand Details  Verbal cues for technique;Verbal cues for safe use of DME/AE    Stand to Sit  5: Supervision;With upper extremity assist;To chair/3-in-1;To bed    Stand to Sit Details (indicate cue type and reason)  Verbal cues for  technique;Verbal cues for safe use of DME/AE    Stand Pivot Transfers  4: Min guard    Stand Pivot Transfer Details (indicate cue type and reason)  with wide RW from scooter to/from [employer]      Knee/Hip Exercises: Aerobic   Nustep  UE/LE's on level 3 for 8 minutes with >/= 65 steps per minute for strengthening/activity tolerance      Knee/Hip Exercises: Standing   Other Standing Knee Exercises  standing at sink: issued ex's to work on while standing at sink at home. cues needed for upright posture and ex form/technique.       Issued the following to  HEP today:  While standing up tall at sink in kitchen- no "propping"/holding with hands, do the following:  1. Alternate reaching up to tap top cabinets for 10 times with each hand.  2. Alternate turning body to reach behind you for 10 times with each arm.  3. Raise both heel up/down together for 10 reps.  4. Alternate small marching/stepping in place for 10 reps each side.  5. Both legs together for mini squats/small knee bends for 10 reps.       PT Education - 02/13/20 1055    Education Details  addition of standing ex's at kitchen sink to [employer]) Educated  Patient    Methods  Explanation;Demonstration;Verbal cues;Handout    Comprehension  Verbalized understanding;Returned demonstration;Verbal cues required;Need further instruction       PT Short Term Goals - 11/15/19 6837      PT SHORT TERM GOAL #1   Title  Pt will be independent with HEP to address strength, flexibility, balance, and gait progression.  TARGET 11/03/2019    Baseline  11/15/19: met with curent ex program    Status  Achieved    Target Date  11/03/19      PT SHORT TERM GOAL #2   Title  Pt will be ambulate at least 15 ft using RW (vs cane), min assist for improved gait within home.    Baseline  11/15/19: 20 feet with wide RW min gaurd to min assist    Time  --    Period  --    Status  Achieved    Target Date  11/03/19      PT SHORT TERM GOAL #3    Title  Pt will perform sit<>stand with minimal assistance for improved transfer efficiency and safety.    Baseline  11/15/19: met in session today    Time  --    Period  --    Status  Achieved    Target Date  11/03/19        PT Long Term Goals - 01/30/20 1550      PT LONG TERM GOAL #1   Title  Pt will be independent with progression of final HEP.  TARGET 03/08/2020    Baseline  01/30/2020-has been looking for a recumbent bike for home; continues to perform HEP (mostly seated)    Time  4    Period  Weeks    Status  Revised      PT LONG TERM GOAL #2   Title  Pt will perform standing activities with 1-2 UE support for 5 minutes for improved standing participation in ADLs.    Baseline  2-4 minutes standing in PT sessions; 3-5 minutes standing at home.    Time  4    Period  Weeks    Status  Revised      PT LONG TERM GOAL #3   Title  Pt will report ambulating at least 3 short bouts of gait (15-20 ft) at home each day, as part of short distance gait in the home.    Baseline  pt reports only walking in the mornings at home    Time  4    Period  Weeks    Status  New            Plan - 02/13/20 1026    Clinical Impression Statement  Today's skilled session continued to focus on strengthening and activity tolerance. Added new standing ex's to pt's HEP today with rest breaks  taken as needed. The pt is progressing and should benefit from continued PT to progress toward unmet goals.    Personal Factors and Comorbidities  Comorbidity 3+    Comorbidities  HTN, pre-DM, OSA, fibromyalgia, depression, obesity,  GERD, hypothyroid, OA of multiple js (hands, knees, shoulders)MRI pituitary adenoma: stable    Examination-Participation Restrictions  Community Activity;Meal Prep;Other   standing ADL's   Stability/Clinical Decision Making  Evolving/Moderate complexity    Rehab Potential  Good    PT Frequency  1x / week    PT Duration  4 weeks   per recert 3/00/5110   PT Treatment/Interventions   ADLs/Self Care Home Management;Aquatic Therapy;Gait training;Stair training;Functional mobility training;Therapeutic activities;Therapeutic exercise;Balance training;Neuromuscular re-education;Patient/family education    PT Next Visit Plan  also consider adding stretching to HEP; continue with strengthening, standing posture/tolerance and gait with wide RW    PT Home Exercise Plan  Access Code: [email or web address]    Consulted and Agree with Plan of Care  Patient       Patient will benefit from skilled therapeutic intervention in order to improve the following deficits and impairments:  Abnormal gait, Decreased range of motion, Difficulty walking, Decreased endurance, Decreased activity tolerance, Pain, Decreased balance, Impaired flexibility, Decreased mobility, Decreased strength  Visit Diagnosis: Difficulty in walking, not elsewhere classified  Unsteadiness on feet  Muscle weakness (generalized)     Problem List Patient Active Problem List   Diagnosis Date Noted  . Multilevel degenerative disc disease 10/31/2018  . Abdominal pain 10/28/2018  . Change in bowel function 10/28/2018  . Gastritis and gastroduodenitis 04/26/2018  . Atrophic vaginitis 04/01/2018  . Diabetes mellitus ([place]) 03/31/2018  . Upper airway cough syndrome 02/08/2018  . Dysphagia 12/14/2017  . 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 Urinary incontinence 07/23/2017  . Early satiety 06/16/2017  . Hx of iron deficiency anemia 05/28/2017  . Throat pain in adult 04/05/2017  . Prediabetes 02/04/2017  . Dyspnea on exertion 12/23/2016  . Chronic allergic rhinitis 12/23/2016  . Hemoptysis 12/23/2016  . Fibromyalgia 08/18/2016  . Chronic lymphocytic thyroiditis 08/18/2016  . Depression 04/01/2016  . OSA (obstructive sleep apnea) 04/01/2016  . Essential hypertension 12/09/2015  . Hypothyroidism 12/09/2015  . Morbid obesity due to excess calories ([place]) 12/09/2015  . Constipation 05/27/2015  . Fatty liver 05/27/2015  . Abdominal  pain, chronic, epigastric 07/04/2012  . Anxiety 06/09/2012  . History of gastroesophageal reflux (GERD) 06/09/2012  . Hyperlipidemia 06/09/2012  . Refusal of blood transfusions as patient is Jehovah's Witness 06/09/2012  . 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 Abnormal small bowel biopsy 09/18/2011  . Lactose intolerance 09/18/2011  . GERD 01/21/2010    Willow Ora, PTA, [facility] [address], [place], [place] [address] [phone] 02/14/20, 9:03 AM   Name: STEPHANIE LITTMAN MRN: [id number] Date of Birth: 09/14/1956

## 2020-02-13 NOTE — Patient Instructions (Signed)
While standing up tall at sink in kitchen- no "propping"/holding with hands, do the following:  1. Alternate reaching up to tap top cabinets for 10 times with each hand.  2. Alternate turning body to reach behind you for 10 times with each arm.  3. Raise both heel up/down together for 10 reps.  4. Alternate small marching/stepping in place for 10 reps each side.  5. Both legs together for mini squats/small knee bends for 10 reps.

## 2020-02-14 DIAGNOSIS — M25551 Pain in right hip: Secondary | ICD-10-CM | POA: Diagnosis not present

## 2020-02-14 DIAGNOSIS — M25552 Pain in left hip: Secondary | ICD-10-CM | POA: Diagnosis not present

## 2020-02-14 DIAGNOSIS — M79641 Pain in right hand: Secondary | ICD-10-CM | POA: Diagnosis not present

## 2020-02-14 DIAGNOSIS — M545 Low back pain: Secondary | ICD-10-CM | POA: Diagnosis not present

## 2020-02-15 ENCOUNTER — Encounter: Payer: Self-pay | Admitting: Internal Medicine

## 2020-02-15 ENCOUNTER — Ambulatory Visit: Payer: Medicaid Other | Attending: Internal Medicine | Admitting: Internal Medicine

## 2020-02-15 ENCOUNTER — Other Ambulatory Visit: Payer: Self-pay

## 2020-02-15 DIAGNOSIS — M797 Fibromyalgia: Secondary | ICD-10-CM | POA: Diagnosis not present

## 2020-02-15 DIAGNOSIS — Z7901 Long term (current) use of anticoagulants: Secondary | ICD-10-CM | POA: Diagnosis not present

## 2020-02-15 DIAGNOSIS — Z87891 Personal history of nicotine dependence: Secondary | ICD-10-CM | POA: Diagnosis not present

## 2020-02-15 DIAGNOSIS — E1169 Type 2 diabetes mellitus with other specified complication: Secondary | ICD-10-CM | POA: Diagnosis not present

## 2020-02-15 DIAGNOSIS — F332 Major depressive disorder, recurrent severe without psychotic features: Secondary | ICD-10-CM | POA: Diagnosis not present

## 2020-02-15 DIAGNOSIS — I10 Essential (primary) hypertension: Secondary | ICD-10-CM | POA: Diagnosis not present

## 2020-02-15 DIAGNOSIS — R7303 Prediabetes: Secondary | ICD-10-CM

## 2020-02-15 DIAGNOSIS — Z79899 Other long term (current) drug therapy: Secondary | ICD-10-CM | POA: Insufficient documentation

## 2020-02-15 DIAGNOSIS — R058 Other specified cough: Secondary | ICD-10-CM

## 2020-02-15 DIAGNOSIS — K635 Polyp of colon: Secondary | ICD-10-CM | POA: Insufficient documentation

## 2020-02-15 DIAGNOSIS — Z833 Family history of diabetes mellitus: Secondary | ICD-10-CM | POA: Diagnosis not present

## 2020-02-15 DIAGNOSIS — R05 Cough: Secondary | ICD-10-CM

## 2020-02-15 DIAGNOSIS — F331 Major depressive disorder, recurrent, moderate: Secondary | ICD-10-CM

## 2020-02-15 DIAGNOSIS — Z7982 Long term (current) use of aspirin: Secondary | ICD-10-CM | POA: Diagnosis not present

## 2020-02-15 DIAGNOSIS — Z7984 Long term (current) use of oral hypoglycemic drugs: Secondary | ICD-10-CM | POA: Insufficient documentation

## 2020-02-15 DIAGNOSIS — G4733 Obstructive sleep apnea (adult) (pediatric): Secondary | ICD-10-CM | POA: Insufficient documentation

## 2020-02-15 LAB — GLUCOSE, POCT (MANUAL RESULT ENTRY): POC Glucose: 118 mg/dl — AB (ref 70–99)

## 2020-02-15 NOTE — Progress Notes (Signed)
Patient ID: Robin [name], female    DOB: 1955-10-26  MRN: [id number]  CC: Diabetes and Hypertension   Subjective: Robin [name] is a 64 y.o. female who presents for chronic ds management Her concerns today include:  Patient with history ofHTN, DM,OSA, fibromyalgia, depression, obesity, GERD, hypothyroid, OA of multiple js (hands, knees, shoulders, hips), pituitary adenoma status post resection  DM:  Last A1C 11/2019 was in the range for DM.  No longer preDM.  She does not plan to go back to [facility].  She has been going for about 1 yr and feels like they are not really helping her and she stays stressed out all the time which affects her eating habits.   -"I stay stress out and I am a stress eater." -meets with therapist Q 2 wks.  States she was told that she may need med change because she still is depressed and stressed.  She is on Cymbalta, Wellbutrin and elavil. Does telemed visits regularly with Dr. Marrian Salvage her psychiatrist.  Next appt in 1 wk  HTN:  Did not take meds as yet this morning.  Reports compliance with medications and low-salt diet.  Blood pressure readings on other recent physician visits have been good. She feels blood pressure is elevated today because she is in pain.    UACS:  Seeing Dr. Melvyn Novas.  Pt feels everything triggers her asthma.  "Any little thing I do I am out of breath." Using Ventolin 3 x a day.   Has f/u with Dr. Melvyn Novas later this mth for additional test  Had c-scope in March.  She had 4 sessile serrated polyps removed.  Will need repeat colonoscopy in 3 years.  Had sleep study through Dr. Claiborne Billings.  She has sleep apnea. -She was informed of results but no rxn as yet for CPAP.  Doing P.T Work on walking with walker.  Therapist is trying to get her to stand a little more.  For the most part she has been confined to her motorized wheelchair. Patient Active Problem List   Diagnosis Date Noted  . Multilevel degenerative disc disease  10/31/2018  . Abdominal pain 10/28/2018  . Change in bowel function 10/28/2018  . Gastritis and gastroduodenitis 04/26/2018  . Atrophic vaginitis 04/01/2018  . Diabetes mellitus ([place]) 03/31/2018  . Upper airway cough syndrome 02/08/2018  . Dysphagia 12/14/2017  . 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 Urinary incontinence 07/23/2017  . Early satiety 06/16/2017  . Hx of iron deficiency anemia 05/28/2017  . Throat pain in adult 04/05/2017  . Prediabetes 02/04/2017  . Dyspnea on exertion 12/23/2016  . Chronic allergic rhinitis 12/23/2016  . Hemoptysis 12/23/2016  . Fibromyalgia 08/18/2016  . Chronic lymphocytic thyroiditis 08/18/2016  . Depression 04/01/2016  . OSA (obstructive sleep apnea) 04/01/2016  . Essential hypertension 12/09/2015  . Hypothyroidism 12/09/2015  . Morbid obesity due to excess calories ([place]) 12/09/2015  . Constipation 05/27/2015  . Fatty liver 05/27/2015  . Abdominal pain, chronic, epigastric 07/04/2012  . Anxiety 06/09/2012  . History of gastroesophageal reflux (GERD) 06/09/2012  . Hyperlipidemia 06/09/2012  . Refusal of blood transfusions as patient is Jehovah's Witness 06/09/2012  . 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 Abnormal small bowel biopsy 09/18/2011  . Lactose intolerance 09/18/2011  . GERD 01/21/2010     Current Outpatient Medications on File Prior to Visit  Medication Sig Dispense Refill  . amitriptyline (ELAVIL) 50 MG tablet TAKE 1 TABLET BY MOUTH AT BEDTIME. 30 tablet 2  . buPROPion (  WELLBUTRIN XL) 300 MG 24 hr tablet TAKE 1 TABLET (300 MG TOTAL) BY MOUTH EVERY MORNING. 30 tablet 2  . Metoprolol Tartrate 75 MG TABS TAKE 1 TABLET BY MOUTH TWICE DAILY. 60 tablet 2  . montelukast (SINGULAIR) 10 MG tablet TAKE 1 TABLET BY MOUTH AT BEDTIME. 30 tablet 2  . ACCU-CHEK AVIVA PLUS test strip USE AS DIRECTED 100 each 12  . acetaminophen (TYLENOL) 500 MG tablet Take 1,000 mg by mouth 2 (two) times daily.     Marland Kitchen albuterol (VENTOLIN HFA) 108 (90 Base)  MCG/ACT inhaler Inhale 1-2 puffs into the lungs every 6 (six) hours as needed for wheezing or shortness of breath. 18 g 0  . aspirin EC 81 MG tablet Take 81 mg by mouth at bedtime.    Marland Kitchen atorvastatin (LIPITOR) 20 MG tablet Take 0.5 tablets (10 mg total) by mouth daily. 90 tablet 3  . calcium carbonate (TUMS - DOSED IN MG ELEMENTAL CALCIUM) 500 MG chewable tablet Chew 2 tablets by mouth at bedtime.    . celecoxib (CELEBREX) 100 MG capsule Take 100 mg by mouth 2 (two) times daily.    . cetirizine (ZYRTEC) 10 MG tablet Take 1 tablet (10 mg total) by mouth daily. 30 tablet 1  . chlorpheniramine (CHLOR-TRIMETON) 4 MG tablet Take 4 mg by mouth every 4 (four) hours as needed for allergies.     . Cholecalciferol 1.25 MG (50000 UT) capsule Take 50,000 Units by mouth once a week.     Marland Kitchen CINNAMON PO Take 1 capsule by mouth daily.    Marland Kitchen DEXILANT 60 MG capsule TAKE 1 CAPSULE (60 MG TOTAL) BY MOUTH DAILY. (Patient taking differently: Take 60 mg by mouth daily. ) 30 capsule 11  . diclofenac sodium (VOLTAREN) 1 % GEL Apply 2-4 g topically 4 (four) times daily as needed (PAIN.).    Marland Kitchen DULoxetine (CYMBALTA) 30 MG capsule TAKE 1 CAPSULE (30 MG TOTAL) BY MOUTH DAILY. 30 capsule 6  . famotidine (PEPCID) 20 MG tablet One after supper (Patient taking differently: Take 20 mg by mouth every evening. One after supper) 30 tablet 11  . fluticasone (FLONASE) 50 MCG/ACT nasal spray Place 1 spray into both nostrils daily. 16 g 1  . furosemide (LASIX) 40 MG tablet TAKE 1/2 TO 1 TABLET BY MOUTH DAILY FOR LOWER EXTREMITY SWELLING (Patient taking differently: Take 20-40 mg by mouth daily. ) 90 tablet 2  . gabapentin (NEURONTIN) 300 MG capsule Take 1 capsule (300 mg total) by mouth 4 (four) times daily. 120 capsule 2  . glucose blood (ONETOUCH ULTRA) test strip Use as instructed 100 each 12  . glucose blood test strip Accu-Chek Aviva Plus test strips    . glucose blood test strip     . hydrALAZINE (APRESOLINE) 50 MG tablet TAKE 1 TABLET  (50 MG TOTAL) BY MOUTH 3 (THREE) TIMES DAILY. (Patient taking differently: Take 100 mg by mouth 3 (three) times daily. ) 90 tablet 1  . levothyroxine (SYNTHROID) 88 MCG tablet Take 1 tablet (88 mcg total) by mouth daily. 30 tablet 2  . LINZESS 290 MCG CAPS capsule TAKE 1 CAPSULE BY MOUTH DAILY BEFORE BREAKFAST. (Patient taking differently: Take 290 mcg by mouth daily. ) 90 capsule 3  . metFORMIN (GLUCOPHAGE) 500 MG tablet TAKE 1/2 TABLET DAILY WITH BREAKFAST (Patient taking differently: Take 250 mg by mouth daily. ) 15 tablet 2  . Multiple Vitamin (MULTIVITAMIN WITH MINERALS) TABS tablet Take 1 tablet by mouth daily. Alive Women's Multivitamin    .  Omega-3 Fatty Acids (FISH OIL) 1000 MG CAPS Take 1,000 mg by mouth every evening.    Marland Kitchen OVER THE COUNTER MEDICATION Take 1,000 mg by mouth daily. BEET ROOT     . oxyCODONE-acetaminophen (PERCOCET) 10-325 MG tablet Take 1 tablet by mouth 2 (two) times daily.     . polyethylene glycol-electrolytes (TRILYTE) 420 g solution Take 4,000 mLs by mouth as directed. 4000 mL 0  . predniSONE (DELTASONE) 10 MG tablet Take  4 each am x 2 days,   2 each am x 2 days,  1 each am x 2 days and stop (Patient not taking: Reported on 02/15/2020) 14 tablet 0  . PREMARIN vaginal cream Place 1 Applicatorful vaginally at bedtime.   3  . sodium chloride (OCEAN) 0.65 % SOLN nasal spray Place 2 sprays into both nostrils every 4 (four) hours as needed for congestion.    Marland Kitchen spironolactone (ALDACTONE) 25 MG tablet Take 1 tablet (25 mg total) by mouth daily. 30 tablet 10  . vitamin B-12 (CYANOCOBALAMIN) 1000 MCG tablet Take 1,000 mcg by mouth daily.     No current facility-administered medications on file prior to visit.    Allergies  Allergen Reactions  . Celexa [Citalopram Hydrobromide] Other (See Comments)    "Made me feel out of it"  . Gabapentin     Contributed to lower extremity edema  . Norvasc [Amlodipine Besylate]     Edema in lower extremities  . Diflucan [Fluconazole]  Rash    Social History   Socioeconomic History  . Marital status: Married    Spouse name: Not on file  . Number of children: 4  . Years of education: 10  . Highest education level: Not on file  Occupational History    Employer: UNEMPLOYED  Tobacco Use  . Smoking status: Former Smoker    Packs/day: 0.50    Years: 10.00    Pack years: 5.00    Types: Cigarettes    Start date: 07/14/1975    Quit date: 04/04/1993    Years since quitting: 26.8  . Smokeless tobacco: Never Used  . Tobacco comment: 17 yrs ago  Substance and Sexual Activity  . Alcohol use: No    Alcohol/week: 0.0 standard drinks  . Drug use: No  . Sexual activity: Not on file  Other Topics Concern  . Not on file  Social History Narrative   Lives with husband in an apartment on the first floor.  Has 4 children.     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  Trying to get disability.  Formerly worked as a Teacher, early years/pre.  Education: 11th grade.      [facility] (12/23/16):   Originally from [facility]. She was raised in [place]. 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 No pets currently. No bird exposure. She does have mold in her current home in the bathroom, laundry room, & master bedroom.    Social Determinants of Health   Financial Resource Strain:   . Difficulty of Paying Living Expenses:   Food Insecurity:   . Worried About Charity fundraiser in the Last Year:   . Arboriculturist in the Last Year:   Transportation Needs:   . Film/video editor (Medical):   Marland Kitchen Lack of Transportation (Non-Medical):   Physical Activity:   .  Days of Exercise per Week:   . Minutes of Exercise per Session:   Stress:   . Feeling of Stress :   Social Connections:   . Frequency of Communication with Friends and Family:   . Frequency of Social Gatherings with Friends and  Family:   . Attends Religious Services:   . Active Member of Clubs or Organizations:   . Attends Archivist Meetings:   Marland Kitchen Marital Status:   Intimate Partner Violence:   . Fear of Current or Ex-Partner:   . Emotionally Abused:   Marland Kitchen Physically Abused:   . Sexually Abused:     Family History  Problem Relation Age of Onset  . Kidney cancer Father   . Hypertension Father   . Hyperlipidemia Father   . Sleep apnea Father   . Diabetes Mother   . Hypertension Mother   . Heart Problems Mother   . Hyperlipidemia Mother   . Asthma Mother   . Sleep apnea Mother   . Liver disease Brother   . Arthritis Brother   . Hypertension Sister   . Allergic rhinitis Sister   . Stomach cancer Other        aunt  . Breast cancer Maternal Grandmother   . Kidney disease Maternal Grandfather   . Breast cancer Paternal Grandmother   . Hypertension Brother   . Hyperlipidemia Brother   . Hypertension Brother   . Hypertension Sister   . Hyperlipidemia Sister   . Lupus Maternal Aunt   . Colon cancer Neg Hx   . Eczema Neg Hx   . Immunodeficiency Neg Hx   . Urticaria Neg Hx     Past Surgical History:  Procedure Laterality Date  . ABDOMINAL HYSTERECTOMY    . ABDOMINAL HYSTERECTOMY  02/18/2000  . CARDIAC CATHETERIZATION  08/18/2003   normal L main/LAD/L Cfx/RCA (Dr. Adora Fridge)  . COLONOSCOPY  2006   Dr. [name], hyperplastic polyps  . COLONOSCOPY  08/06/11   abnormal terminal ileum for 10cm, erosions, geographical ulceration. Bx small bowel mucosa with prominent intramucosal lymphoid aggregates, slightly inflammed  . COLONOSCOPY WITH PROPOFOL N/A 12/28/2019   Procedure: COLONOSCOPY WITH PROPOFOL;  Surgeon: [name], MD;  [facility];  Service: Endoscopy;  Laterality: N/A;  8:30am  . DIRECT LARYNGOSCOPY  03/15/2012   Procedure: DIRECT LARYNGOSCOPY;  Surgeon: [name], MD;  Location: [facility];  Service: ENT;  Laterality: N/A; Dr. Wolicki--:>no foreign body seen. normal  esophagus to 40cm  . ESOPHAGEAL DILATION  04/17/2015   Procedure: ESOPHAGEAL DILATION;  Surgeon: [name], MD;  [facility];  Service: Endoscopy;;  . ESOPHAGOGASTRODUODENOSCOPY  08/23/2008   [id number] distal esophageal erosions consistent with mild erosive reflux esophagitis, otherwise unremarkable esophagus/ Tiny antral erosions of doubtful clinical significance, otherwise normal stomach, patent pylorus, normal D1 and D2  . ESOPHAGOGASTRODUODENOSCOPY  08/06/11   small hh, noncritical Schatzki's ring s/p 76 F  . ESOPHAGOGASTRODUODENOSCOPY N/A 04/17/2015   [id number] s/p dilation  . ESOPHAGOGASTRODUODENOSCOPY (EGD) WITH PROPOFOL N/A 01/20/2018   Dr. [name]: Erosive gastropathy, normal-appearing esophagus status post empiric dilation.  Chronic gastritis, no H. pylori.  . ESOPHAGOSCOPY  03/15/2012   Procedure: ESOPHAGOSCOPY;  Surgeon: [name], MD;  Location: [facility];  Service: ENT;  Laterality: N/A;  . KNEE ARTHROSCOPY  04/27/2001  . MALONEY DILATION N/A 01/20/2018   Procedure: Venia Minks DILATION;  Surgeon: [name], MD;  [facility];  Service: Endoscopy;  Laterality: N/A;  . NM MYOCAR  PERF WALL MOTION  2003   negative bruce protocol exercise tress test; EF 68%; intermediate risk study due to evidence of anterior wall ischemia extending from mid-ventricle to apex  . PITUITARY SURGERY  06/2012   benign tumor, surgeon at [facility]  . POLYPECTOMY  12/28/2019   Procedure: POLYPECTOMY;  Surgeon: [name], MD;  [facility];  Service: Endoscopy;;  . RECTOCELE REPAIR    . TRANSTHORACIC ECHOCARDIOGRAM  2003   EF normal  . VAGINAL PROLAPSE REPAIR    . VIDEO BRONCHOSCOPY Bilateral 03/08/2017   Procedure: VIDEO BRONCHOSCOPY WITHOUT FLUORO;  Surgeon: Javier Glazier, MD;  Location: [facility];  Service: Cardiopulmonary;  Laterality: Bilateral;    ROS: Review of Systems Negative except as stated above  PHYSICAL EXAM: BP (!) 175/80   Pulse 66   Temp  (!) 97.3 F (36.3 C)   Resp 16   SpO2 94%   Wt Readings from Last 3 Encounters:  01/20/20 (!) 333 lb (151 kg)  01/15/20 (!) 354 lb (160.6 kg)  12/28/19 109 lb 4.5 oz (49.6 kg)     Physical Exam  General appearance - alert, well appearing, morbidly obese African-American female and in no distress Mental status - normal mood, behavior, speech, dress, motor activity, and thought processes Neck - supple, no significant adenopathy Chest -scattered expiratory wheezes Heart - normal rate, regular rhythm, normal S1, S2, no murmurs, rubs, clicks or gallops Extremities - peripheral pulses normal, no pedal edema, no clubbing or cyanosis   CMP Latest Ref Rng & Units 12/26/2019 10/25/2019 06/01/2019  Glucose 70 - 99 mg/dL 165(H) - 123(H)  BUN 8 - 23 mg/dL 16 - 14  Creatinine 0.44 - 1.00 mg/dL 0.76 - 0.78  Sodium 135 - 145 mmol/L 137 - 138  Potassium 3.5 - 5.1 mmol/L 4.3 - 4.2  Chloride 98 - 111 mmol/L 101 - 102  CO2 22 - 32 mmol/L 28 - 28  Calcium 8.9 - 10.3 mg/dL 9.2 - 9.0  Total Protein 6.0 - 8.5 g/dL - 7.2 7.3  Total Bilirubin 0.0 - 1.2 mg/dL - 0.4 0.7  Alkaline Phos 39 - 117 IU/L - 88 61  AST 0 - 40 IU/L - 21 19  ALT 0 - 32 IU/L - 25 25   Lipid Panel     Component Value Date/Time   CHOL 141 10/25/2019 1038   TRIG 58 10/25/2019 1038   HDL 58 10/25/2019 1038   CHOLHDL 2.4 10/25/2019 1038   CHOLHDL 2.3 12/16/2015 0950   VLDL 13 12/16/2015 0950   LDLCALC 71 10/25/2019 1038    CBC    Component Value Date/Time   WBC 8.2 01/15/2020 1101   RBC 4.15 01/15/2020 1101   HGB 12.1 01/15/2020 1101   HGB 12.0 01/20/2017 1001   HCT 37.3 01/15/2020 1101   HCT 38.2 01/20/2017 1001   HCT 41 06/25/2011 1053   PLT 337.0 01/15/2020 1101   PLT 356 01/20/2017 1001   MCV 89.9 01/15/2020 1101   MCV 90 01/20/2017 1001   MCV 91.1 06/25/2011 1053   MCH 28.6 06/01/2019 1052   MCHC 32.4 01/15/2020 1101   RDW 13.2 01/15/2020 1101   RDW 13.1 01/20/2017 1001   LYMPHSABS 3.2 01/15/2020 1101    LYMPHSABS 3.4 (H) 01/20/2017 1001   MONOABS 1.1 (H) 01/15/2020 1101   EOSABS 0.1 01/15/2020 1101   EOSABS 0.1 01/20/2017 1001   BASOSABS 0.1 01/15/2020 1101   BASOSABS 0.0 01/20/2017 1001   Results for orders placed or  performed in visit on 02/15/20  POCT glucose (manual entry)  Result Value Ref Range   POC Glucose 118 (A) 70 - 99 mg/dl    ASSESSMENT AND PLAN: 1. Type 2 diabetes mellitus with morbid obesity ([place]) Discussed and encourage healthy eating habits.  She is working with her therapist to try to control emotional eating.  She is working with physical therapy to try improve her mobility.  Continue low-dose Metformin. - POCT glucose (manual entry) - Hemoglobin A1c  2. Essential hypertension Not at goal but patient has not taken medicines as yet for today.  Recent blood pressure readings have been good.  She will continue current medications and low-salt diet  3. OSA (obstructive sleep apnea) I have given her prescription for the CPAP machine to take to the medical supply store.  4. Polyp of descending colon, unspecified type   5. Moderate episode of recurrent major depressive disorder ([place]) Followed by psychiatry and therapist.  Recommend that she speak with her psychiatrist about whether her medications need to be adjusted  6. Upper airway cough syndrome Followed by pulmonary     Patient was given the opportunity to ask questions.  Patient verbalized understanding of the plan and was able to repeat key elements of the plan.   Orders Placed This Encounter  Procedures  . POCT glucose (manual entry)     Requested Prescriptions    No prescriptions requested or ordered in this encounter    No follow-ups on file.  Karle Plumber, MD, FACP

## 2020-02-15 NOTE — Patient Instructions (Signed)
Take your blood pressure medications as soon as you return home as your blood pressure is elevated.  I have given your prescription for your CPAP machine.  You should take this to a medical supply store.  Speak with your psychiatrist about whether your current medications for depression need to be adjusted or changed.

## 2020-02-16 ENCOUNTER — Telehealth: Payer: Self-pay

## 2020-02-16 LAB — HEMOGLOBIN A1C
Est. average glucose Bld gHb Est-mCnc: 146 mg/dL
Hgb A1c MFr Bld: 6.7 % — ABNORMAL HIGH (ref 4.8–5.6)

## 2020-02-16 NOTE — Telephone Encounter (Signed)
Patient name and DOB has been verified Patient was informed of lab results. Patient had no questions.  

## 2020-02-16 NOTE — Telephone Encounter (Signed)
-----   Message from Ladell Pier, MD sent at 02/16/2020  8:10 AM EDT ----- Let patient know her A1c is 6.7 meaning her diabetes is under good control.

## 2020-02-19 DIAGNOSIS — F54 Psychological and behavioral factors associated with disorders or diseases classified elsewhere: Secondary | ICD-10-CM | POA: Diagnosis not present

## 2020-02-20 ENCOUNTER — Ambulatory Visit: Payer: Medicaid Other | Admitting: Physical Therapy

## 2020-02-20 MED FILL — GABAPENTIN 300 MG CAPSULE: 300 | 30 days supply | Qty: 120 | Fill #2

## 2020-02-20 MED FILL — MONTELUKAST SOD 10 MG TAB: 10 | 90 days supply | Qty: 90 | Fill #0

## 2020-02-20 MED FILL — BUPROPION HCL ER (XL) 300 M: 300 | 90 days supply | Qty: 90 | Fill #0

## 2020-02-20 MED FILL — METOPROLOL TARTRATE 75 MG T: 75 | 90 days supply | Qty: 180 | Fill #0

## 2020-02-20 MED FILL — LEVOTHYROXINE 88 MCG TABLET: 88 | 30 days supply | Qty: 30 | Fill #1

## 2020-02-22 ENCOUNTER — Other Ambulatory Visit: Payer: Self-pay

## 2020-02-22 ENCOUNTER — Ambulatory Visit: Payer: Medicaid Other | Admitting: Physical Therapy

## 2020-02-22 ENCOUNTER — Encounter: Payer: Self-pay | Admitting: Physical Therapy

## 2020-02-22 DIAGNOSIS — R262 Difficulty in walking, not elsewhere classified: Secondary | ICD-10-CM | POA: Diagnosis not present

## 2020-02-22 DIAGNOSIS — R2681 Unsteadiness on feet: Secondary | ICD-10-CM

## 2020-02-22 DIAGNOSIS — M6281 Muscle weakness (generalized): Secondary | ICD-10-CM

## 2020-02-22 NOTE — Therapy (Signed)
[facility] [address] [place], [place], [address] Phone: [phone]   Fax:  [phone]  Physical Therapy Treatment  Patient Details  Name: Robin [name] MRN: [id number] Date of Birth: 10/01/1956 Referring Provider (PT): Karle Plumber, MD   Encounter Date: 02/22/2020  PT End of Session - 02/22/20 1026    Visit Number  14    Number of Visits  16   per recert 6/31/4970   Date for PT Re-Evaluation  26/37/85   60 day cert for 4 week POC   Authorization Type  Medicaid    Authorization Time Period  4 visits 01/10/20-02/06/2020 -Requested 4 additional visits, 01/30/2020; 4 visits from 02/13/20- 03/11/20    Authorization - Visit Number  2    Authorization - Number of Visits  4    PT Start Time  1017    PT Stop Time  1057    PT Time Calculation (min)  40 min    Equipment Utilized During Treatment  Gait belt    Activity Tolerance  Patient limited by pain;Patient tolerated treatment well;Patient limited by fatigue    Behavior During Therapy  [facility] for tasks assessed/performed       Past Medical History:  Diagnosis Date  . Anxiety disorder   . Arthritis   . Asthma   . Chronic neck pain   . Constipation   . Depression   . Diabetes mellitus without complication ([place])   . Dyspnea   . GERD (gastroesophageal reflux disease)   . Hiatal hernia    small  . Hyperlipidemia   . Hypertension   . Hypothyroidism   . Schatzki's ring    non critical  . Sleep apnea    CPAP, Sleep study at [facility]  . Sleep apnea     Past Surgical History:  Procedure Laterality Date  . ABDOMINAL HYSTERECTOMY    . ABDOMINAL HYSTERECTOMY  02/18/2000  . CARDIAC CATHETERIZATION  08/18/2003   normal L main/LAD/L Cfx/RCA (Dr. Adora Fridge)  . COLONOSCOPY  2006   Dr. [name], hyperplastic polyps  . COLONOSCOPY  08/06/11   abnormal terminal ileum for 10cm, erosions, geographical ulceration. Bx small bowel mucosa with prominent  intramucosal lymphoid aggregates, slightly inflammed  . COLONOSCOPY WITH PROPOFOL N/A 12/28/2019   Procedure: COLONOSCOPY WITH PROPOFOL;  Surgeon: [name], MD;  [facility];  Service: Endoscopy;  Laterality: N/A;  8:30am  . DIRECT LARYNGOSCOPY  03/15/2012   Procedure: DIRECT LARYNGOSCOPY;  Surgeon: [name], MD;  Location: [facility];  Service: ENT;  Laterality: N/A; Dr. Wolicki--:>no foreign body seen. normal esophagus to 40cm  . ESOPHAGEAL DILATION  04/17/2015   Procedure: ESOPHAGEAL DILATION;  Surgeon: [name], MD;  [facility];  Service: Endoscopy;;  . ESOPHAGOGASTRODUODENOSCOPY  08/23/2008   YIF:OYDX distal esophageal erosions consistent with mild erosive reflux esophagitis, otherwise unremarkable esophagus/ Tiny antral erosions of doubtful clinical significance, otherwise normal stomach, patent pylorus, normal D1 and D2  . ESOPHAGOGASTRODUODENOSCOPY  08/06/11   small hh, noncritical Schatzki's ring s/p 54 F  . ESOPHAGOGASTRODUODENOSCOPY N/A 04/17/2015   AJO:INOMVE s/p dilation  . ESOPHAGOGASTRODUODENOSCOPY (EGD) WITH PROPOFOL N/A 01/20/2018   Dr. [name]: Erosive gastropathy, normal-appearing esophagus status post empiric dilation.  Chronic gastritis, no H. pylori.  . ESOPHAGOSCOPY  03/15/2012   Procedure: ESOPHAGOSCOPY;  Surgeon: [name], MD;  Location: [facility];  Service: ENT;  Laterality: N/A;  . KNEE ARTHROSCOPY  04/27/2001  . MALONEY DILATION  N/A 01/20/2018   Procedure: Venia Minks DILATION;  Surgeon: [name], MD;  [facility];  Service: Endoscopy;  Laterality: N/A;  . [place]  2003   negative bruce protocol exercise tress test; EF 68%; intermediate risk study due to evidence of anterior wall ischemia extending from mid-ventricle to apex  . PITUITARY SURGERY  06/2012   benign tumor, surgeon at [facility]  . POLYPECTOMY  12/28/2019   Procedure: POLYPECTOMY;  Surgeon: [name], MD;  [facility];   Service: Endoscopy;;  . RECTOCELE REPAIR    . TRANSTHORACIC ECHOCARDIOGRAM  2003   EF normal  . VAGINAL PROLAPSE REPAIR    . VIDEO BRONCHOSCOPY Bilateral 03/08/2017   Procedure: VIDEO BRONCHOSCOPY WITHOUT FLUORO;  Surgeon: Javier Glazier, MD;  Location: [facility];  Service: Cardiopulmonary;  Laterality: Bilateral;    There were no vitals filed for this visit.  Subjective Assessment - 02/22/20 1024    Subjective  No new complaints. No falls. Had to reschedule due to her asthma acting up, it's better today. Reports she is suppossed to be having a pulmonary function test this month. Reports the standing ex's are going good at home.    Patient is accompained by:  Family member   spouse in car   Pertinent History  PMH: HTN, pre-DM, OSA, fibromyalgia, depression, obesity,  GERD, hypothyroid, OA of multiple js (hands, knees, shoulders)MRI pituitary adenoma: stable; pinched nerve in low back    Limitations  Walking;Standing    How long can you stand comfortably?  5 min    How long can you walk comfortably?  Walks if she needs to; always having pain    Patient Stated Goals  Pt's goal for therapy is to try to get around better than I am now.    Currently in Pain?  Yes    Pain Score  6     Pain Location  Generalized   legs, hips, shoulders   Pain Descriptors / Indicators  Aching;Sore    Pain Type  Chronic pain    Pain Onset  More than a month ago    Aggravating Factors   immobility, weather    Pain Relieving Factors  medication helps, resting, exercises help some            [facility] Adult PT Treatment/Exercise - 02/22/20 1027      Transfers   Transfers  Sit to Stand;Stand to [employer] Transfers    Sit to Stand  5: Supervision;With upper extremity assist;From chair/3-in-1;From bed    Stand to Sit  5: Supervision;With upper extremity assist;To chair/3-in-1;To bed    Stand Pivot Transfers  4: Min guard    Stand Pivot Transfer Details (indicate cue type and reason)  with wide RW  from scooter to [employer]      Ambulation/Gait   Ambulation/Gait  Yes    Ambulation/Gait Assistance  4: Min guard    Ambulation/Gait Assistance Details  cues on posture, walker position wtih gait and increased step length.     Ambulation Distance (Feet)  60 Feet    Assistive device  Rolling walker    Gait Pattern  Step-through pattern;Decreased step length - right;Decreased step length - left;Decreased hip/knee flexion - right;Decreased hip/knee flexion - left;Decreased dorsiflexion - right;Decreased dorsiflexion - left;Right flexed knee in stance;Left flexed knee in stance;Trunk flexed;Poor foot clearance - left;Poor foot clearance - right;Antalgic    Ambulation Surface  Level;Indoor      Knee/Hip Exercises: Aerobic  Nustep  UE/LE's on level 4 for 8 minutes with >/= 65 steps per minute for strengthening/activity tolerance      Knee/Hip Exercises: Standing   Other Standing Knee Exercises  standing at outside of parallel bars- alternating UE raises, mini squats, alternating fwd foot taps to bottom of bars/back to floor, alternating stepping out/back in. 10 reps each with 3 seated rest breaks needed with ex's. bil UE support with min guard assist, cues on posture and ex form/technique.             PT Short Term Goals - 11/15/19 8366      PT SHORT TERM GOAL #1   Title  Pt will be independent with HEP to address strength, flexibility, balance, and gait progression.  TARGET 11/03/2019    Baseline  11/15/19: met with curent ex program    Status  Achieved    Target Date  11/03/19      PT SHORT TERM GOAL #2   Title  Pt will be ambulate at least 15 ft using RW (vs cane), min assist for improved gait within home.    Baseline  11/15/19: 20 feet with wide RW min gaurd to min assist    Time  --    Period  --    Status  Achieved    Target Date  11/03/19      PT SHORT TERM GOAL #3   Title  Pt will perform sit<>stand with minimal assistance for improved transfer efficiency and safety.     Baseline  11/15/19: met in session today    Time  --    Period  --    Status  Achieved    Target Date  11/03/19        PT Long Term Goals - 01/30/20 1550      PT LONG TERM GOAL #1   Title  Pt will be independent with progression of final HEP.  TARGET 03/08/2020    Baseline  01/30/2020-has been looking for a recumbent bike for home; continues to perform HEP (mostly seated)    Time  4    Period  Weeks    Status  Revised      PT LONG TERM GOAL #2   Title  Pt will perform standing activities with 1-2 UE support for 5 minutes for improved standing participation in ADLs.    Baseline  2-4 minutes standing in PT sessions; 3-5 minutes standing at home.    Time  4    Period  Weeks    Status  Revised      PT LONG TERM GOAL #3   Title  Pt will report ambulating at least 3 short bouts of gait (15-20 ft) at home each day, as part of short distance gait in the home.    Baseline  pt reports only walking in the mornings at home    Time  4    Period  Weeks    Status  New            Plan - 02/22/20 1026    Clinical Impression Statement  Today's skilled session continued to focus on strengthening, gait with RW and standing tolerance. Rest breaks taken as needed due to pain/fatigue. Pt reporting 1 increment increase in pain in knees to 7/10 at end of session, no other complaints or issues reported. Pt planning to take pain meds and ice once home. The pt is progressing toward goals and should benefit from continued PT to progress toward unmet  goals.    Personal Factors and Comorbidities  Comorbidity 3+    Comorbidities  HTN, pre-DM, OSA, fibromyalgia, depression, obesity,  GERD, hypothyroid, OA of multiple js (hands, knees, shoulders)MRI pituitary adenoma: stable    Examination-Participation Restrictions  Community Activity;Meal Prep;Other   standing ADL's   Stability/Clinical Decision Making  Evolving/Moderate complexity    Rehab Potential  Good    PT Frequency  1x / week    PT Duration  4  weeks   per recert 0/81/3887   PT Treatment/Interventions  ADLs/Self Care Home Management;Aquatic Therapy;Gait training;Stair training;Functional mobility training;Therapeutic activities;Therapeutic exercise;Balance training;Neuromuscular re-education;Patient/family education    PT Next Visit Plan  also consider adding stretching to HEP; continue with strengthening, standing posture/tolerance and gait with wide RW    PT Home Exercise Plan  Access Code: [email or web address]    Consulted and Agree with Plan of Care  Patient       Patient will benefit from skilled therapeutic intervention in order to improve the following deficits and impairments:  Abnormal gait, Decreased range of motion, Difficulty walking, Decreased endurance, Decreased activity tolerance, Pain, Decreased balance, Impaired flexibility, Decreased mobility, Decreased strength  Visit Diagnosis: Difficulty in walking, not elsewhere classified  Unsteadiness on feet  Muscle weakness (generalized)     Problem List Patient Active Problem List   Diagnosis Date Noted  . Polyp of descending colon 02/15/2020  . Multilevel degenerative disc disease 10/31/2018  . Abdominal pain 10/28/2018  . Change in bowel function 10/28/2018  . Gastritis and gastroduodenitis 04/26/2018  . Atrophic vaginitis 04/01/2018  . Diabetes mellitus ([place]) 03/31/2018  . Upper airway cough syndrome 02/08/2018  . Dysphagia 12/14/2017  . 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 Urinary incontinence 07/23/2017  . Early satiety 06/16/2017  . Hx of iron deficiency anemia 05/28/2017  . Throat pain in adult 04/05/2017  . Prediabetes 02/04/2017  . Dyspnea on exertion 12/23/2016  . Chronic allergic rhinitis 12/23/2016  . Hemoptysis 12/23/2016  . Fibromyalgia 08/18/2016  . Chronic lymphocytic thyroiditis 08/18/2016  . Depression 04/01/2016  . OSA (obstructive sleep apnea) 04/01/2016  . Essential hypertension 12/09/2015  . Hypothyroidism 12/09/2015  . Morbid obesity due to  excess calories ([place]) 12/09/2015  . Constipation 05/27/2015  . Fatty liver 05/27/2015  . Abdominal pain, chronic, epigastric 07/04/2012  . Anxiety 06/09/2012  . History of gastroesophageal reflux (GERD) 06/09/2012  . Hyperlipidemia 06/09/2012  . Refusal of blood transfusions as patient is Jehovah's Witness 06/09/2012  . 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 Abnormal small bowel biopsy 09/18/2011  . Lactose intolerance 09/18/2011  . GERD 01/21/2010    Willow Ora, PTA, [facility] [address] [place], [place] [address] [phone] 02/22/20, 9:44 PM   Name: Robin [name] MRN: [id number] Date of Birth: 01-26-1956

## 2020-02-27 ENCOUNTER — Telehealth: Payer: Self-pay | Admitting: *Deleted

## 2020-02-27 ENCOUNTER — Inpatient Hospital Stay (HOSPITAL_COMMUNITY): Admission: RE | Admit: 2020-02-27 | Payer: Medicaid Other | Source: Ambulatory Visit

## 2020-02-27 DIAGNOSIS — G4733 Obstructive sleep apnea (adult) (pediatric): Secondary | ICD-10-CM

## 2020-02-27 NOTE — Telephone Encounter (Signed)
Informed patient of sleep study results and patient understanding was verbalized. Patient understands her sleep study showed - CPAP was initiated at 6 cm and was titrated to optimal PAP pressure at 19 cm of water. AHI 0.6/h;  DIAGNOSIS - Obstructive Sleep Apnea (327.23 [G47.33 ICD-10])   Patient understands Dr Claiborne Billings RECOMMENDATIONS - Recommend an initial trial of CPAP therapy with EPR of 3 at 19 cm H2O with heated humidification.  A Medium size [employer] Full Face Mask Simplus mask was used for the study.   DME selection is [facility]. Patient understands she will be contacted by [facility] to set up her cpap. Patient understands to call if [facility] does not contact her with new setup in a timely manner. Patient understands they will be called once confirmation has been received from [facility] that they have received their new machine to schedule 10 week follow up appointment.  [facility] notified of new cpap order  Please add to airview Patient was grateful for the call and thanked me.

## 2020-02-27 NOTE — Telephone Encounter (Signed)
-----   Message from Troy Sine, MD sent at 02/01/2020  7:04 PM EDT ----- Mariann Laster, please notify pt and set up with DME to initiate CPAP

## 2020-02-29 ENCOUNTER — Ambulatory Visit: Payer: Medicaid Other | Admitting: Physical Therapy

## 2020-02-29 ENCOUNTER — Other Ambulatory Visit: Payer: Self-pay

## 2020-02-29 DIAGNOSIS — R2681 Unsteadiness on feet: Secondary | ICD-10-CM | POA: Diagnosis not present

## 2020-02-29 DIAGNOSIS — M6281 Muscle weakness (generalized): Secondary | ICD-10-CM

## 2020-02-29 DIAGNOSIS — R262 Difficulty in walking, not elsewhere classified: Secondary | ICD-10-CM

## 2020-02-29 NOTE — Therapy (Signed)
[facility] [address] [place], [place], [address] Phone: [phone]   Fax:  [phone]  Physical Therapy Treatment  Patient Details  Name: Robin [name] MRN: [id number] Date of Birth: Mar 28, 1956 Referring Provider (PT): Karle Plumber, MD   Encounter Date: 02/29/2020  PT End of Session - 02/29/20 1150    Visit Number  15    Number of Visits  16   per recert 11/16/9415   Date for PT Re-Evaluation  40/81/44   60 day cert for 4 week POC   Authorization Type  Medicaid    Authorization Time Period  4 visits 01/10/20-02/06/2020 -Requested 4 additional visits, 01/30/2020; 4 visits from 02/13/20- 03/11/20    Authorization - Visit Number  3    Authorization - Number of Visits  4    PT Start Time  1110   pt arrives late   PT Stop Time  1145    PT Time Calculation (min)  35 min    Equipment Utilized During Treatment  Gait belt    Activity Tolerance  Patient limited by pain;Patient tolerated treatment well;Patient limited by fatigue    Behavior During Therapy  [facility] for tasks assessed/performed       Past Medical History:  Diagnosis Date  . Anxiety disorder   . Arthritis   . Asthma   . Chronic neck pain   . Constipation   . Depression   . Diabetes mellitus without complication ([place])   . Dyspnea   . GERD (gastroesophageal reflux disease)   . Hiatal hernia    small  . Hyperlipidemia   . Hypertension   . Hypothyroidism   . Schatzki's ring    non critical  . Sleep apnea    CPAP, Sleep study at [place]  . Sleep apnea     Past Surgical History:  Procedure Laterality Date  . ABDOMINAL HYSTERECTOMY    . ABDOMINAL HYSTERECTOMY  02/18/2000  . CARDIAC CATHETERIZATION  08/18/2003   normal L main/LAD/L Cfx/RCA (Dr. Adora Fridge)  . COLONOSCOPY  2006   Dr. [name], hyperplastic polyps  . COLONOSCOPY  08/06/11   abnormal terminal ileum for 10cm, erosions, geographical ulceration. Bx small bowel mucosa with  prominent intramucosal lymphoid aggregates, slightly inflammed  . COLONOSCOPY WITH PROPOFOL N/A 12/28/2019   Procedure: COLONOSCOPY WITH PROPOFOL;  Surgeon: [name], MD;  [facility];  Service: Endoscopy;  Laterality: N/A;  8:30am  . DIRECT LARYNGOSCOPY  03/15/2012   Procedure: DIRECT LARYNGOSCOPY;  Surgeon: [name], MD;  Location: [facility];  Service: ENT;  Laterality: N/A; Dr. Wolicki--:>no foreign body seen. normal esophagus to 40cm  . ESOPHAGEAL DILATION  04/17/2015   Procedure: ESOPHAGEAL DILATION;  Surgeon: [name], MD;  [facility];  Service: Endoscopy;;  . ESOPHAGOGASTRODUODENOSCOPY  08/23/2008   YJE:HUDJ distal esophageal erosions consistent with mild erosive reflux esophagitis, otherwise unremarkable esophagus/ Tiny antral erosions of doubtful clinical significance, otherwise normal stomach, patent pylorus, normal D1 and D2  . ESOPHAGOGASTRODUODENOSCOPY  08/06/11   small hh, noncritical Schatzki's ring s/p 16 F  . ESOPHAGOGASTRODUODENOSCOPY N/A 04/17/2015   SHF:WYOVZC s/p dilation  . ESOPHAGOGASTRODUODENOSCOPY (EGD) WITH PROPOFOL N/A 01/20/2018   Dr. [name]: Erosive gastropathy, normal-appearing esophagus status post empiric dilation.  Chronic gastritis, no H. pylori.  . ESOPHAGOSCOPY  03/15/2012   Procedure: ESOPHAGOSCOPY;  Surgeon: [name], MD;  Location: [facility];  Service: ENT;  Laterality: N/A;  . KNEE ARTHROSCOPY  04/27/2001  .  MALONEY DILATION N/A 01/20/2018   Procedure: Venia Minks DILATION;  Surgeon: [name], MD;  [facility];  Service: Endoscopy;  Laterality: N/A;  . [place]  2003   negative bruce protocol exercise tress test; EF 68%; intermediate risk study due to evidence of anterior wall ischemia extending from mid-ventricle to apex  . PITUITARY SURGERY  06/2012   benign tumor, surgeon at [facility]  . POLYPECTOMY  12/28/2019   Procedure: POLYPECTOMY;  Surgeon: [name], MD;  [facility];  Service: Endoscopy;;  . RECTOCELE REPAIR    . TRANSTHORACIC ECHOCARDIOGRAM  2003   EF normal  . VAGINAL PROLAPSE REPAIR    . VIDEO BRONCHOSCOPY Bilateral 03/08/2017   Procedure: VIDEO BRONCHOSCOPY WITHOUT FLUORO;  Surgeon: Javier Glazier, MD;  Location: [place] [place];  Service: Cardiopulmonary;  Laterality: Bilateral;    There were no vitals filed for this visit.  Subjective Assessment - 02/29/20 1110    Subjective  Been kind of achy, sometimes days are just where "pain just won't let go".  Still been trying to move everyday.  Do my standing exercises in the kitchen. Usually walk short distances in the morning and evening.    Patient is accompained by:  Family member   spouse in car   Pertinent History  PMH: HTN, pre-DM, OSA, fibromyalgia, depression, obesity,  GERD, hypothyroid, OA of multiple js (hands, knees, shoulders)MRI pituitary adenoma: stable; pinched nerve in low back    Limitations  Walking;Standing    How long can you stand comfortably?  5 min    How long can you walk comfortably?  Walks if she needs to; always having pain    Patient Stated Goals  Pt's goal for therapy is to try to get around better than I am now.    Currently in Pain?  Yes    Pain Location  Generalized   legs, hips, shoulders   Pain Descriptors / Indicators  Aching;Sore    Pain Type  Chronic pain    Pain Onset  More than a month ago    Pain Frequency  Constant    Aggravating Factors   immobility, weather    Pain Relieving Factors  warm showers                        [facility] Adult PT Treatment/Exercise - 02/29/20 0001      Transfers   Transfers  Sit to Stand;Stand to Sit    Sit to Stand  5: Supervision;With upper extremity assist;From chair/3-in-1;From bed    Stand to Sit  5: Supervision;With upper extremity assist;To chair/3-in-1;To bed    Transfer Cueing  Pt transfers power scooter to mat using RW; after gait, transfers from chair to power scooter using RW with min guard  assistance.      Ambulation/Gait   Ambulation/Gait  Yes    Ambulation/Gait Assistance  4: Min guard    Ambulation Distance (Feet)  60 Feet    Assistive device  Rolling walker    Gait Pattern  Step-through pattern;Decreased step length - right;Decreased step length - left;Decreased hip/knee flexion - right;Decreased hip/knee flexion - left;Decreased dorsiflexion - right;Decreased dorsiflexion - left;Right flexed knee in stance;Left flexed knee in stance;Trunk flexed;Poor foot clearance - left;Poor foot clearance - right;Antalgic    Ambulation Surface  Level;Indoor    Gait Comments  Pt reports walking 2 times daily at home-mornings and evenings      Exercises  Exercises  Knee/Hip;Ankle      Knee/Hip Exercises: Stretches   Active Hamstring Stretch  Right;Left;3 reps;30 seconds   foot propped on 4" block, then 2 more trials, foot on floor   Active Hamstring Stretch Limitations  Pt c/o increased pain in R hip and anterior L knee.  After additional reps, pt reports less pain.    Gastroc Stretch  Right;Left;3 reps;Other (comment)   15 seconds, using strap       Provided black theraband and pt trialed stretch with this; advised if this stretches too much for gastroc stretch, she could use belt or sheet at home.      PT Education - 02/29/20 1149    Education Details  Additions to HEP-stretches-see instructions    Person(s) Educated  Patient    Methods  Explanation;Demonstration;Handout    Comprehension  Verbalized understanding;Returned demonstration;Verbal cues required       PT Short Term Goals - 11/15/19 0952      PT SHORT TERM GOAL #1   Title  Pt will be independent with HEP to address strength, flexibility, balance, and gait progression.  TARGET 11/03/2019    Baseline  11/15/19: met with curent ex program    Status  Achieved    Target Date  11/03/19      PT SHORT TERM GOAL #2   Title  Pt will be ambulate at least 15 ft using RW (vs cane), min assist for improved gait within  home.    Baseline  11/15/19: 20 feet with wide RW min gaurd to min assist    Time  --    Period  --    Status  Achieved    Target Date  11/03/19      PT SHORT TERM GOAL #3   Title  Pt will perform sit<>stand with minimal assistance for improved transfer efficiency and safety.    Baseline  11/15/19: met in session today    Time  --    Period  --    Status  Achieved    Target Date  11/03/19        PT Long Term Goals - 01/30/20 1550      PT LONG TERM GOAL #1   Title  Pt will be independent with progression of final HEP.  TARGET 03/08/2020    Baseline  01/30/2020-has been looking for a recumbent bike for home; continues to perform HEP (mostly seated)    Time  4    Period  Weeks    Status  Revised      PT LONG TERM GOAL #2   Title  Pt will perform standing activities with 1-2 UE support for 5 minutes for improved standing participation in ADLs.    Baseline  2-4 minutes standing in PT sessions; 3-5 minutes standing at home.    Time  4    Period  Weeks    Status  Revised      PT LONG TERM GOAL #3   Title  Pt will report ambulating at least 3 short bouts of gait (15-20 ft) at home each day, as part of short distance gait in the home.    Baseline  pt reports only walking in the mornings at home    Time  [place] - 02/29/20 1150    Clinical Impression Statement  Educated patient on stretching options for seated  positions at home, to stretch hamstrings and gastrocs and added to HEP.  Pt has c/o in R hip and L anterior knee with initial stretching, but does report slight improvement with additional stretches.  Pt is progressing towards goals and plans for d/c next visit.    Personal Factors and Comorbidities  Comorbidity 3+    Comorbidities  HTN, pre-DM, OSA, fibromyalgia, depression, obesity,  GERD, hypothyroid, OA of multiple js (hands, knees, shoulders)MRI pituitary adenoma: stable    Examination-Activity Limitations   Stand;Stairs;Locomotion Level;Transfers    Examination-Participation Restrictions  Community Activity;Meal Prep;Other   standing ADL's   Stability/Clinical Decision Making  Evolving/Moderate complexity    Rehab Potential  Good    PT Frequency  1x / week    PT Duration  4 weeks   per recert 1/51/7616   PT Treatment/Interventions  ADLs/Self Care Home Management;Aquatic Therapy;Gait training;Stair training;Functional mobility training;Therapeutic activities;Therapeutic exercise;Balance training;Neuromuscular re-education;Patient/family education    PT Next Visit Plan  Plan to check LTGs and discharge next visit; ask if pt got in her pool at home over the weekend?    PT Home Exercise Plan  Access Code: [email or web address]    Consulted and Agree with Plan of Care  Patient       Patient will benefit from skilled therapeutic intervention in order to improve the following deficits and impairments:  Abnormal gait, Decreased range of motion, Difficulty walking, Decreased endurance, Decreased activity tolerance, Pain, Decreased balance, Impaired flexibility, Decreased mobility, Decreased strength  Visit Diagnosis: Muscle weakness (generalized)  Difficulty in walking, not elsewhere classified     Problem List Patient Active Problem List   Diagnosis Date Noted  . Polyp of descending colon 02/15/2020  . Multilevel degenerative disc disease 10/31/2018  . Abdominal pain 10/28/2018  . Change in bowel function 10/28/2018  . Gastritis and gastroduodenitis 04/26/2018  . Atrophic vaginitis 04/01/2018  . Diabetes mellitus ([place]) 03/31/2018  . Upper airway cough syndrome 02/08/2018  . Dysphagia 12/14/2017  . 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 Urinary incontinence 07/23/2017  . Early satiety 06/16/2017  . Hx of iron deficiency anemia 05/28/2017  . Throat pain in adult 04/05/2017  . Prediabetes 02/04/2017  . Dyspnea on exertion 12/23/2016  . Chronic allergic rhinitis 12/23/2016  . Hemoptysis 12/23/2016  .  Fibromyalgia 08/18/2016  . Chronic lymphocytic thyroiditis 08/18/2016  . Depression 04/01/2016  . OSA (obstructive sleep apnea) 04/01/2016  . Essential hypertension 12/09/2015  . Hypothyroidism 12/09/2015  . Morbid obesity due to excess calories ([place]) 12/09/2015  . Constipation 05/27/2015  . Fatty liver 05/27/2015  . Abdominal pain, chronic, epigastric 07/04/2012  . Anxiety 06/09/2012  . History of gastroesophageal reflux (GERD) 06/09/2012  . Hyperlipidemia 06/09/2012  . Refusal of blood transfusions as patient is Jehovah's Witness 06/09/2012  . 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 Abnormal small bowel biopsy 09/18/2011  . Lactose intolerance 09/18/2011  . GERD 01/21/2010    Nykiah Ma W. 02/29/2020, 11:53 AM  Frazier Butt., PT   [facility] [address] [place], [place], [address] Phone: [phone]   Fax:  [phone]  Name: TENESHA GARZA MRN: [id number] Date of Birth: May 11, 1956

## 2020-02-29 NOTE — Patient Instructions (Signed)
Access Code: [email or web address] URL: [email or web address] Date: 11/30/2019 Prepared by: Mady Haagensen  Exercises Seated Heel Toe Raises - 1 x daily - 5 x weekly - 10 reps - 1 sets Seated Long Arc Quad - 1 x daily - 5 x weekly - 10 reps - 1 sets Seated Hamstring Curl with Anchored Resistance - 1 x daily - 5 x weekly - 10 reps - 1 sets Seated March - 1 x daily - 5 x weekly - 10 reps - 1 sets Seated Hip Abduction with Resistance - 1 x daily - 5 x weekly - 10 reps - 1 sets Seated Hamstring Stretch - 1 x daily - 5 x weekly - 3 reps - 1 sets - 30 hold Seated Hip Adduction Squeeze with Ball - 1 x daily - 5 x weekly - 10 reps - 1 sets - 5 hold Seated Gluteal Sets - 1 x daily - 5 x weekly - 10 reps - 1-2 sets Seated Scapular Retraction - 1 x daily - 5 x weekly - 10 reps - 1-2 sets  Added 02/29/2020: Seated Hamstring Stretch - 1-2 x daily - 5 x weekly - 1 sets - 3 reps - 30 sec hold Seated Gastroc Stretch with Strap - 1-2 x daily - 5 x weekly - 1 sets - 3 reps - 15-30 sec hold

## 2020-03-01 ENCOUNTER — Ambulatory Visit: Payer: Medicaid Other | Admitting: Internal Medicine

## 2020-03-05 ENCOUNTER — Ambulatory Visit: Payer: Medicaid Other | Admitting: Physical Therapy

## 2020-03-06 ENCOUNTER — Other Ambulatory Visit: Payer: Self-pay | Admitting: Cardiology

## 2020-03-06 ENCOUNTER — Other Ambulatory Visit: Payer: Self-pay | Admitting: Internal Medicine

## 2020-03-06 MED FILL — ATORVASTATIN CALCIUM 20 MG: 20 | 90 days supply | Qty: 45 | Fill #6

## 2020-03-06 MED FILL — AMITRIPTYLINE HCL 50 MG TAB: 50 | 90 days supply | Qty: 90 | Fill #0

## 2020-03-06 MED FILL — CELECOXIB 100 MG CAP: 100 | 90 days supply | Qty: 180 | Fill #1

## 2020-03-06 MED FILL — FAMOTIDINE 20 MG TABS: 20 | 90 days supply | Qty: 90 | Fill #3

## 2020-03-07 ENCOUNTER — Other Ambulatory Visit: Payer: Self-pay

## 2020-03-07 ENCOUNTER — Ambulatory Visit: Payer: Medicaid Other | Attending: Internal Medicine | Admitting: Physical Therapy

## 2020-03-07 ENCOUNTER — Encounter: Payer: Self-pay | Admitting: Family Medicine

## 2020-03-07 ENCOUNTER — Ambulatory Visit (INDEPENDENT_AMBULATORY_CARE_PROVIDER_SITE_OTHER): Payer: Medicaid Other | Admitting: Family Medicine

## 2020-03-07 ENCOUNTER — Other Ambulatory Visit: Payer: Self-pay | Admitting: Cardiology

## 2020-03-07 VITALS — BP 143/68 | HR 56

## 2020-03-07 DIAGNOSIS — N393 Stress incontinence (female) (male): Secondary | ICD-10-CM

## 2020-03-07 DIAGNOSIS — R262 Difficulty in walking, not elsewhere classified: Secondary | ICD-10-CM | POA: Insufficient documentation

## 2020-03-07 DIAGNOSIS — M6281 Muscle weakness (generalized): Secondary | ICD-10-CM | POA: Insufficient documentation

## 2020-03-07 DIAGNOSIS — R2681 Unsteadiness on feet: Secondary | ICD-10-CM | POA: Insufficient documentation

## 2020-03-07 MED FILL — METFORMIN HCL 500 MG TABS: 500 | 30 days supply | Qty: 15 | Fill #0

## 2020-03-07 NOTE — Assessment & Plan Note (Signed)
At Bariatric clinic x. 1 year without any significant weight loss. Advised to consider weight loss surgery.

## 2020-03-07 NOTE — Progress Notes (Signed)
   Subjective:    Patient ID: Robin [name] is a 64 y.o. female presenting with No chief complaint on file.  on 03/07/2020  HPI: This is a new patient referral today, for evaluation of urinary incontinence.  The patient also reports that she thinks her bladder has fallen, though she has no true symptoms of this.  Patient is a G4, P4 status post SVD x4.  Her biggest baby was 9 pounds.  Had TAH for fibroids. S/p AP repair with TVT with Dr. Quincy Simmonds 2010. Initially that worked well. Report leakage of fluid with coughing, sneezing or moving. Has pain with intercourse. Has had hot flashes. Has some burning with urination.  The patient has a complicated medical history that includes obstructive sleep apnea which is not well treated, diabetes, asthma, hypertension, high cholesterol, fibromyalgia, and debilitating osteoarthritis.  Patient is morbidly obese. Has depression, on meds. Increased life stressors, + PHQ9, not suicial Husband with kidney cancer which is metastatic.   Review of Systems  Constitutional: Negative for chills and fever.  Respiratory: Negative for shortness of breath.   Cardiovascular: Negative for chest pain.  Gastrointestinal: Negative for abdominal pain, nausea and vomiting.  Genitourinary: Negative for dysuria.  Skin: Negative for rash.      Objective:    BP (!) 143/68   Pulse (!) 56  Physical Exam Constitutional:      General: She is not in acute distress.    Appearance: She is well-developed.  HENT:     Head: Normocephalic and atraumatic.  Eyes:     General: No scleral icterus. Cardiovascular:     Rate and Rhythm: Normal rate.  Pulmonary:     Effort: Pulmonary effort is normal.  Abdominal:     Palpations: Abdomen is soft.  Genitourinary:    Comments: NEFG. Vagina is pink and normal in appearance.  There is actually good anterior support.  No cystocele noted at this time. Musculoskeletal:     Cervical back: Neck supple.  Skin:    General: Skin is warm and dry.   Neurological:     Mental Status: She is alert and oriented to person, place, and time.         Assessment & Plan:   Problem List Items Addressed This Visit      Unprioritized   Morbid obesity due to excess calories ([place])    At Bariatric clinic x. 1 year without any significant weight loss. Advised to consider weight loss surgery.      Urinary incontinence - Primary    Already had one surgery. Further surgery limited by body habitus and medical issues. Would need to see Uro/GYN for this. Will try pelvic PT. R/o UTI.      Relevant Orders   Ambulatory referral to Physical Therapy      Total time: 30 minutes.  Return in about 3 months (around 06/07/2020) for a follow-up, virtual, referral to PT.  Donnamae Jude 03/07/2020 9:52 AM

## 2020-03-07 NOTE — Assessment & Plan Note (Signed)
Already had one surgery. Further surgery limited by body habitus and medical issues. Would need to see Uro/GYN for this. Will try pelvic PT. R/o UTI.

## 2020-03-08 DIAGNOSIS — F331 Major depressive disorder, recurrent, moderate: Secondary | ICD-10-CM | POA: Diagnosis not present

## 2020-03-08 MED FILL — hydrALAZINE HCL 50 MG TABS: 50 | 90 days supply | Qty: 270 | Fill #0

## 2020-03-11 ENCOUNTER — Other Ambulatory Visit: Payer: Self-pay

## 2020-03-11 ENCOUNTER — Ambulatory Visit: Payer: Medicaid Other | Admitting: Physical Therapy

## 2020-03-11 DIAGNOSIS — R2681 Unsteadiness on feet: Secondary | ICD-10-CM

## 2020-03-11 DIAGNOSIS — M6281 Muscle weakness (generalized): Secondary | ICD-10-CM | POA: Diagnosis not present

## 2020-03-11 DIAGNOSIS — R262 Difficulty in walking, not elsewhere classified: Secondary | ICD-10-CM

## 2020-03-11 NOTE — Therapy (Signed)
[facility] [address] [place], [place], [address] Phone: [phone]   Fax:  [phone]  Physical Therapy Treatment/Discharge Summary  Patient Details  Name: Robin [name] MRN: [id number] Date of Birth: April 07, 1956 Referring Provider (PT): Karle Plumber, MD   Encounter Date: 03/11/2020  PT End of Session - 03/11/20 0938    Visit Number  16    Number of Visits  16   per recert 6/96/7893   Date for PT Re-Evaluation  81/01/75   60 day cert for 4 week POC   Authorization Type  Medicaid    Authorization Time Period  4 visits 01/10/20-02/06/2020 -Requested 4 additional visits, 01/30/2020; 4 visits from 02/13/20- 03/11/20    Authorization - Visit Number  4    Authorization - Number of Visits  4    PT Start Time  0806   Pt arrives late   PT Stop Time  0840    PT Time Calculation (min)  34 min    Equipment Utilized During Treatment  Gait belt    Activity Tolerance  Patient limited by pain;Patient tolerated treatment well;Patient limited by fatigue   limited in standing today by back pain   Behavior During Therapy  [facility] for tasks assessed/performed       Past Medical History:  Diagnosis Date  . Anxiety disorder   . Arthritis   . Asthma   . Chronic neck pain   . Constipation   . Depression   . Diabetes mellitus without complication ([place])   . Dyspnea   . GERD (gastroesophageal reflux disease)   . Hiatal hernia    small  . Hyperlipidemia   . Hypertension   . Hypothyroidism   . Schatzki's ring    non critical  . Sleep apnea    CPAP, Sleep study at [place]  . Sleep apnea     Past Surgical History:  Procedure Laterality Date  . ABDOMINAL HYSTERECTOMY    . ABDOMINAL HYSTERECTOMY  02/18/2000  . CARDIAC CATHETERIZATION  08/18/2003   normal L main/LAD/L Cfx/RCA (Dr. Adora Fridge)  . COLONOSCOPY  2006   Dr. [name], hyperplastic polyps  . COLONOSCOPY  08/06/11   abnormal terminal ileum for 10cm,  erosions, geographical ulceration. Bx small bowel mucosa with prominent intramucosal lymphoid aggregates, slightly inflammed  . COLONOSCOPY WITH PROPOFOL N/A 12/28/2019   Procedure: COLONOSCOPY WITH PROPOFOL;  Surgeon: [name], MD;  [facility];  Service: Endoscopy;  Laterality: N/A;  8:30am  . DIRECT LARYNGOSCOPY  03/15/2012   Procedure: DIRECT LARYNGOSCOPY;  Surgeon: [name], MD;  Location: [place];  Service: ENT;  Laterality: N/A; Dr. Wolicki--:>no foreign body seen. normal esophagus to 40cm  . ESOPHAGEAL DILATION  04/17/2015   Procedure: ESOPHAGEAL DILATION;  Surgeon: [name], MD;  [facility];  Service: Endoscopy;;  . ESOPHAGOGASTRODUODENOSCOPY  08/23/2008   ZWC:HENI distal esophageal erosions consistent with mild erosive reflux esophagitis, otherwise unremarkable esophagus/ Tiny antral erosions of doubtful clinical significance, otherwise normal stomach, patent pylorus, normal D1 and D2  . ESOPHAGOGASTRODUODENOSCOPY  08/06/11   small hh, noncritical Schatzki's ring s/p 85 F  . ESOPHAGOGASTRODUODENOSCOPY N/A 04/17/2015   DPO:EUMPNT s/p dilation  . ESOPHAGOGASTRODUODENOSCOPY (EGD) WITH PROPOFOL N/A 01/20/2018   Dr. [name]: Erosive gastropathy, normal-appearing esophagus status post empiric dilation.  Chronic gastritis, no H. pylori.  . ESOPHAGOSCOPY  03/15/2012   Procedure: ESOPHAGOSCOPY;  Surgeon: [name], MD;  Location: [facility];  Service: ENT;  Laterality: N/A;  . KNEE ARTHROSCOPY  04/27/2001  . MALONEY DILATION N/A 01/20/2018   Procedure: Venia Minks DILATION;  Surgeon: [name], MD;  [facility];  Service: Endoscopy;  Laterality: N/A;  . [place]  2003   negative bruce protocol exercise tress test; EF 68%; intermediate risk study due to evidence of anterior wall ischemia extending from mid-ventricle to apex  . PITUITARY SURGERY  06/2012   benign tumor, surgeon at [facility]  . POLYPECTOMY  12/28/2019   Procedure:  POLYPECTOMY;  Surgeon: [name], MD;  [facility];  Service: Endoscopy;;  . RECTOCELE REPAIR    . TRANSTHORACIC ECHOCARDIOGRAM  2003   EF normal  . VAGINAL PROLAPSE REPAIR    . VIDEO BRONCHOSCOPY Bilateral 03/08/2017   Procedure: VIDEO BRONCHOSCOPY WITHOUT FLUORO;  Surgeon: Javier Glazier, MD;  Location: [place];  Service: Cardiopulmonary;  Laterality: Bilateral;    There were no vitals filed for this visit.  Subjective Assessment - 03/11/20 0808    Subjective  The weather is just not cooperating with my pain.  Try to keep moving and not stay in one place too long.    Patient is accompained by:  Family member   spouse in car   Pertinent History  PMH: HTN, pre-DM, OSA, fibromyalgia, depression, obesity,  GERD, hypothyroid, OA of multiple js (hands, knees, shoulders)MRI pituitary adenoma: stable; pinched nerve in low back    Limitations  Walking;Standing    How long can you stand comfortably?  5 min    How long can you walk comfortably?  Walks if she needs to; always having pain    Patient Stated Goals  Pt's goal for therapy is to try to get around better than I am now.    Currently in Pain?  Yes    Pain Score  7     Pain Location  Generalized    Pain Descriptors / Indicators  Aching;Sore    Pain Type  Chronic pain    Pain Onset  More than a month ago    Aggravating Factors   weather, immobility    Pain Relieving Factors  moving through the day, warm showers                        [facility] Adult PT Treatment/Exercise - 03/11/20 0820      Transfers   Transfers  Sit to Stand;Stand to Sit    Sit to Stand  5: Supervision;With upper extremity assist;From chair/3-in-1;From bed    Stand to Sit  5: Supervision;With upper extremity assist;To chair/3-in-1;To bed    Transfer Cueing  Pt transfers power w/c to mat, using RW with supervision.    Comments  Performed sit<>stand at least 4 reps through session.      Ambulation/Gait   Ambulation/Gait  Yes     Ambulation/Gait Assistance  4: Min guard    Ambulation Distance (Feet)  25 Feet   then 10 ft   Assistive device  Rolling walker    Gait Pattern  Step-through pattern;Decreased step length - right;Decreased step length - left;Decreased hip/knee flexion - right;Decreased hip/knee flexion - left;Decreased dorsiflexion - right;Decreased dorsiflexion - left;Right flexed knee in stance;Left flexed knee in stance;Trunk flexed;Poor foot clearance - left;Poor foot clearance - right;Antalgic    Ambulation Surface  Level;Indoor      Exercises   Exercises  Knee/Hip;Ankle    Other Exercises   At counter, standing x 2.5  minutes:  (limited due to back pain).  Pt performs lateral weightshifting x 10 reps, then alternating UE lifts x 10 reps.  Standing with head turns x 5 reps.  Standing at counter x 2 minutes:  heel raises x 10 reps, then hip abduction (step taps) x 5 reps each, pain in R knee.             PT Education - 03/11/20 0937    Education Details  Progress towards goals, plans for d/c this visit.    Person(s) Educated  Patient    Methods  Explanation    Comprehension  Verbalized understanding       PT Short Term Goals - 11/15/19 1610      PT SHORT TERM GOAL #1   Title  Pt will be independent with HEP to address strength, flexibility, balance, and gait progression.  TARGET 11/03/2019    Baseline  11/15/19: met with curent ex program    Status  Achieved    Target Date  11/03/19      PT SHORT TERM GOAL #2   Title  Pt will be ambulate at least 15 ft using RW (vs cane), min assist for improved gait within home.    Baseline  11/15/19: 20 feet with wide RW min gaurd to min assist    Time  --    Period  --    Status  Achieved    Target Date  11/03/19      PT SHORT TERM GOAL #3   Title  Pt will perform sit<>stand with minimal assistance for improved transfer efficiency and safety.    Baseline  11/15/19: met in session today    Time  --    Period  --    Status  Achieved    Target Date   11/03/19        PT Long Term Goals - 03/11/20 0811      PT LONG TERM GOAL #1   Title  Pt will be independent with progression of final HEP.  TARGET 03/08/2020    Baseline  Performing HEP, provided with updates    Time  4    Period  Weeks    Status  Achieved      PT LONG TERM GOAL #2   Title  Pt will perform standing activities with 1-2 UE support for 5 minutes for improved standing participation in ADLs.    Baseline  Reports standing 5-10 minutes at a time in the kitchen at home; in PT session 03/11/2020, pt stood 2.5 minutes, 2 minutes    Time  4    Period  Weeks    Status  Partially Met      PT LONG TERM GOAL #3   Title  Pt will report ambulating at least 3 short bouts of gait (15-20 ft) at home each day, as part of short distance gait in the home.    Baseline  Pt reports walking 2-3 times at home most days    Time  4    Period  Weeks    Status  Achieved            Plan - 03/11/20 0939    Clinical Impression Statement  Assessed LTGs this visit, with pt meeting 2 of 3 LTGs.  LTG 1 and 3 met; LTG 2 partially met, with pt standing about 2 minutes in therapy session today; pt reports standing 5-10 minutes at home.  Pt's back pain and knee pain continue to be  limiting factors in further standing and gait activities.  Pt is aware of continued need for exercise at home and is appropriate for d/c at this time.    Personal Factors and Comorbidities  Comorbidity 3+    Comorbidities  HTN, pre-DM, OSA, fibromyalgia, depression, obesity,  GERD, hypothyroid, OA of multiple js (hands, knees, shoulders)MRI pituitary adenoma: stable    Examination-Activity Limitations  Stand;Stairs;Locomotion Level;Transfers    Examination-Participation Restrictions  Community Activity;Meal Prep;Other   standing ADL's   Stability/Clinical Decision Making  Evolving/Moderate complexity    Rehab Potential  Good    PT Frequency  1x / week    PT Duration  4 weeks   per recert 11/14/4707   PT  Treatment/Interventions  ADLs/Self Care Home Management;Aquatic Therapy;Gait training;Stair training;Functional mobility training;Therapeutic activities;Therapeutic exercise;Balance training;Neuromuscular re-education;Patient/family education    PT Next Visit Plan  Discharge this visit.    PT Home Exercise Plan  Access Code: [email or web address]    Consulted and Agree with Plan of Care  Patient       Patient will benefit from skilled therapeutic intervention in order to improve the following deficits and impairments:  Abnormal gait, Decreased range of motion, Difficulty walking, Decreased endurance, Decreased activity tolerance, Pain, Decreased balance, Impaired flexibility, Decreased mobility, Decreased strength  Visit Diagnosis: Difficulty in walking, not elsewhere classified  Unsteadiness on feet  Muscle weakness (generalized)     Problem List Patient Active Problem List   Diagnosis Date Noted  . Polyp of descending colon 02/15/2020  . Multilevel degenerative disc disease 10/31/2018  . Abdominal pain 10/28/2018  . Change in bowel function 10/28/2018  . Gastritis and gastroduodenitis 04/26/2018  . Atrophic vaginitis 04/01/2018  . Diabetes mellitus ([place]) 03/31/2018  . Upper airway cough syndrome 02/08/2018  . Dysphagia 12/14/2017  . 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 Urinary incontinence 07/23/2017  . Early satiety 06/16/2017  . Hx of iron deficiency anemia 05/28/2017  . Throat pain in adult 04/05/2017  . Prediabetes 02/04/2017  . Dyspnea on exertion 12/23/2016  . Chronic allergic rhinitis 12/23/2016  . Hemoptysis 12/23/2016  . Fibromyalgia 08/18/2016  . Chronic lymphocytic thyroiditis 08/18/2016  . Depression 04/01/2016  . OSA (obstructive sleep apnea) 04/01/2016  . Essential hypertension 12/09/2015  . Hypothyroidism 12/09/2015  . Morbid obesity due to excess calories ([place]) 12/09/2015  . Constipation 05/27/2015  . Fatty liver 05/27/2015  . Abdominal pain, chronic, epigastric  07/04/2012  . Anxiety 06/09/2012  . History of gastroesophageal reflux (GERD) 06/09/2012  . Hyperlipidemia 06/09/2012  . Refusal of blood transfusions as patient is Jehovah's Witness 06/09/2012  . 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 Abnormal small bowel biopsy 09/18/2011  . Lactose intolerance 09/18/2011  . GERD 01/21/2010    Kamila Broda W. 03/11/2020, 9:42 AM Frazier Butt., PT  [facility] [address] [place], [place], [address] Phone: [phone]   Fax:  [phone]  Name: CANTRELL MARTUS MRN: [id number] Date of Birth: 1955/10/22   PHYSICAL THERAPY DISCHARGE SUMMARY  Visits from Start of Care: 16  Current functional level related to goals / functional outcomes: PT Long Term Goals - 03/11/20 0811      PT LONG TERM GOAL #1   Title  Pt will be independent with progression of final HEP.  TARGET 03/08/2020    Baseline  Performing HEP, provided with updates    Time  4    Period  Weeks    Status  Achieved      PT LONG TERM GOAL #2  Title  Pt will perform standing activities with 1-2 UE support for 5 minutes for improved standing participation in ADLs.    Baseline  Reports standing 5-10 minutes at a time in the kitchen at home; in PT session 03/11/2020, pt stood 2.5 minutes, 2 minutes    Time  4    Period  Weeks    Status  Partially Met      PT LONG TERM GOAL #3   Title  Pt will report ambulating at least 3 short bouts of gait (15-20 ft) at home each day, as part of short distance gait in the home.    Baseline  Pt reports walking 2-3 times at home most days    Time  4    Period  Weeks    Status  Achieved      Pt has met 2 of 3 LTGs.   Remaining deficits: Pain, posture, balance, decreased endurance   Education / Equipment: Educated in progression of HEP, importance of continued movement and exercise  Plan: Patient agrees to discharge.  Patient goals were partially met. Patient is being discharged  due to being pleased with the current functional level.  ?????       Mady Haagensen, PT 03/11/20 9:44 AM Phone: [phone] Fax: [phone]

## 2020-03-12 ENCOUNTER — Telehealth: Payer: Self-pay | Admitting: Cardiovascular Disease

## 2020-03-12 NOTE — Telephone Encounter (Signed)
Patient called the answering service requesting to speak to someone about her sleep study results.

## 2020-03-12 NOTE — Telephone Encounter (Signed)
Attempted to call the pt but unable to LM pt mailbox was full. Will try again later today.

## 2020-03-13 DIAGNOSIS — M79641 Pain in right hand: Secondary | ICD-10-CM | POA: Diagnosis not present

## 2020-03-13 DIAGNOSIS — M25552 Pain in left hip: Secondary | ICD-10-CM | POA: Diagnosis not present

## 2020-03-13 DIAGNOSIS — M545 Low back pain: Secondary | ICD-10-CM | POA: Diagnosis not present

## 2020-03-13 DIAGNOSIS — M25551 Pain in right hip: Secondary | ICD-10-CM | POA: Diagnosis not present

## 2020-03-14 NOTE — Telephone Encounter (Signed)
Returned call: Reached out to the patient but unable to lm on vm.

## 2020-03-17 DIAGNOSIS — R110 Nausea: Secondary | ICD-10-CM | POA: Diagnosis not present

## 2020-03-17 DIAGNOSIS — E119 Type 2 diabetes mellitus without complications: Secondary | ICD-10-CM | POA: Diagnosis not present

## 2020-03-17 DIAGNOSIS — R1084 Generalized abdominal pain: Secondary | ICD-10-CM | POA: Diagnosis not present

## 2020-03-17 DIAGNOSIS — K5909 Other constipation: Secondary | ICD-10-CM | POA: Diagnosis not present

## 2020-03-21 MED FILL — LEVOTHYROXINE 88 MCG TABLET: 88 | 30 days supply | Qty: 30 | Fill #2

## 2020-03-21 MED FILL — LINZESS 290 MCG CAPSULE: 290 | 90 days supply | Qty: 90 | Fill #3

## 2020-03-21 MED FILL — SPIRONOLACTONE 25 MG TABLET: 25 | 90 days supply | Qty: 90 | Fill #2

## 2020-03-21 MED FILL — hydrALAZINE HCL 50 MG TABS: 50 | 90 days supply | Qty: 270 | Fill #0

## 2020-03-21 MED FILL — DULoxetine HCL 30 MG CPEP: 30 | 90 days supply | Qty: 90 | Fill #2

## 2020-03-22 ENCOUNTER — Other Ambulatory Visit: Payer: Self-pay | Admitting: Internal Medicine

## 2020-03-25 ENCOUNTER — Encounter: Payer: Self-pay | Admitting: Gastroenterology

## 2020-03-25 ENCOUNTER — Ambulatory Visit: Payer: Medicaid Other | Admitting: Gastroenterology

## 2020-03-25 NOTE — Progress Notes (Deleted)
Primary Care Physician: Ladell Pier, MD  Primary Gastroenterologist:  Garfield Cornea, MD   No chief complaint on file.   HPI: Robin [name] is a 64 y.o. female here for follow-up.  Last seen at time of colonoscopy in March 2021 to further evaluate change in bowel habits.  She had 4 sessile serrated polyps removed from the colon.  Plans for follow-up colonoscopy in 3 years.  She also has a history of GERD, constipation and fatty liver.  Last EGD April 2019 showed erosive gastropathy, chronic gastritis on biopsy but no H. pylori.  Normal-appearing esophagus was dilated.  Previously failed omeprazole twice daily, ranitidine.  Pantoprazole and AcipHex twice daily initially helped but lost efficacy.  Current Outpatient Medications  Medication Sig Dispense Refill  . ACCU-CHEK AVIVA PLUS test strip USE AS DIRECTED 100 each 12  . acetaminophen (TYLENOL) 500 MG tablet Take 1,000 mg by mouth 2 (two) times daily.     Marland Kitchen albuterol (VENTOLIN HFA) 108 (90 Base) MCG/ACT inhaler Inhale 1-2 puffs into the lungs every 6 (six) hours as needed for wheezing or shortness of breath. 18 g 0  . amitriptyline (ELAVIL) 50 MG tablet TAKE 1 TABLET BY MOUTH AT BEDTIME. 30 tablet 2  . aspirin EC 81 MG tablet Take 81 mg by mouth at bedtime.    Marland Kitchen atorvastatin (LIPITOR) 20 MG tablet Take 0.5 tablets (10 mg total) by mouth daily. 90 tablet 3  . buPROPion (WELLBUTRIN XL) 300 MG 24 hr tablet TAKE 1 TABLET (300 MG TOTAL) BY MOUTH EVERY MORNING. 30 tablet 2  . calcium carbonate (TUMS - DOSED IN MG ELEMENTAL CALCIUM) 500 MG chewable tablet Chew 2 tablets by mouth at bedtime.    . celecoxib (CELEBREX) 100 MG capsule Take 100 mg by mouth 2 (two) times daily.    . cetirizine (ZYRTEC) 10 MG tablet Take 1 tablet (10 mg total) by mouth daily. 30 tablet 1  . chlorpheniramine (CHLOR-TRIMETON) 4 MG tablet Take 4 mg by mouth every 4 (four) hours as needed for allergies.     . Cholecalciferol 1.25 MG (50000 UT) capsule Take  50,000 Units by mouth once a week.     Marland Kitchen CINNAMON PO Take 1 capsule by mouth daily.    Marland Kitchen DEXILANT 60 MG capsule TAKE 1 CAPSULE (60 MG TOTAL) BY MOUTH DAILY. (Patient taking differently: Take 60 mg by mouth daily. ) 30 capsule 11  . diclofenac sodium (VOLTAREN) 1 % GEL Apply 2-4 g topically 4 (four) times daily as needed (PAIN.).    Marland Kitchen DULoxetine (CYMBALTA) 30 MG capsule TAKE 1 CAPSULE (30 MG TOTAL) BY MOUTH DAILY. 30 capsule 6  . famotidine (PEPCID) 20 MG tablet One after supper (Patient taking differently: Take 20 mg by mouth every evening. One after supper) 30 tablet 11  . fluticasone (FLONASE) 50 MCG/ACT nasal spray Place 1 spray into both nostrils daily. 16 g 1  . furosemide (LASIX) 40 MG tablet TAKE 1/2 TO 1 TABLET BY MOUTH DAILY FOR LOWER EXTREMITY SWELLING (Patient taking differently: Take 20-40 mg by mouth daily. ) 90 tablet 2  . gabapentin (NEURONTIN) 300 MG capsule TAKE 1 CAPSULE (300 MG TOTAL) BY MOUTH 4 (FOUR) TIMES DAILY. 120 capsule 2  . glucose blood (ONETOUCH ULTRA) test strip Use as instructed 100 each 12  . glucose blood test strip Accu-Chek Aviva Plus test strips    . glucose blood test strip     . hydrALAZINE (APRESOLINE) 50 MG tablet Take 1  tablet (50 mg total) by mouth 3 (three) times daily. 270 tablet 1  . levothyroxine (SYNTHROID) 88 MCG tablet Take 1 tablet (88 mcg total) by mouth daily. 30 tablet 2  . LINZESS 290 MCG CAPS capsule TAKE 1 CAPSULE BY MOUTH DAILY BEFORE BREAKFAST. (Patient taking differently: Take 290 mcg by mouth daily. ) 90 capsule 3  . metFORMIN (GLUCOPHAGE) 500 MG tablet TAKE 1/2 TABLET DAILY WITH BREAKFAST 15 tablet 2  . Metoprolol Tartrate 75 MG TABS TAKE 1 TABLET BY MOUTH TWICE DAILY. 60 tablet 2  . montelukast (SINGULAIR) 10 MG tablet TAKE 1 TABLET BY MOUTH AT BEDTIME. 30 tablet 2  . Multiple Vitamin (MULTIVITAMIN WITH MINERALS) TABS tablet Take 1 tablet by mouth daily. Alive Women's Multivitamin    . Omega-3 Fatty Acids (FISH OIL) 1000 MG CAPS Take  1,000 mg by mouth every evening.    Marland Kitchen OVER THE COUNTER MEDICATION Take 1,000 mg by mouth daily. BEET ROOT     . oxyCODONE-acetaminophen (PERCOCET) 10-325 MG tablet Take 1 tablet by mouth 2 (two) times daily.     . polyethylene glycol-electrolytes (TRILYTE) 420 g solution Take 4,000 mLs by mouth as directed. 4000 mL 0  . PREMARIN vaginal cream Place 1 Applicatorful vaginally at bedtime.   3  . sodium chloride (OCEAN) 0.65 % SOLN nasal spray Place 2 sprays into both nostrils every 4 (four) hours as needed for congestion.    Marland Kitchen spironolactone (ALDACTONE) 25 MG tablet Take 1 tablet (25 mg total) by mouth daily. 30 tablet 10  . vitamin B-12 (CYANOCOBALAMIN) 1000 MCG tablet Take 1,000 mcg by mouth daily.     No current facility-administered medications for this visit.    Allergies as of 03/25/2020 - Review Complete 03/07/2020  Allergen Reaction Noted  . Celexa [citalopram hydrobromide] Other (See Comments) 05/28/2017  . Gabapentin  05/03/2018  . Norvasc [amlodipine besylate]  05/03/2018  . Diflucan [fluconazole] Rash 02/08/2018    ROS:  General: Negative for anorexia, weight loss, fever, chills, fatigue, weakness. ENT: Negative for hoarseness, difficulty swallowing , nasal congestion. CV: Negative for chest pain, angina, palpitations, dyspnea on exertion, peripheral edema.  Respiratory: Negative for dyspnea at rest, dyspnea on exertion, cough, sputum, wheezing.  GI: See history of present illness. GU:  Negative for dysuria, hematuria, urinary incontinence, urinary frequency, nocturnal urination.  Endo: Negative for unusual weight change.    Physical Examination:   There were no vitals taken for this visit.  General: Well-nourished, well-developed in no acute distress.  Eyes: No icterus. Mouth: Oropharyngeal mucosa moist and pink , no lesions erythema or exudate. Lungs: Clear to auscultation bilaterally.  Heart: Regular rate and rhythm, no murmurs rubs or gallops.  Abdomen: Bowel  sounds are normal, nontender, nondistended, no hepatosplenomegaly or masses, no abdominal bruits or hernia , no rebound or guarding.   Extremities: No lower extremity edema. No clubbing or deformities. Neuro: Alert and oriented x 4   Skin: Warm and dry, no jaundice.   Psych: Alert and cooperative, normal mood and affect.  Labs:  Lab Results  Component Value Date   HGBA1C 6.7 (H) 02/15/2020   Lab Results  Component Value Date   WBC 8.2 01/15/2020   HGB 12.1 01/15/2020   HCT 37.3 01/15/2020   MCV 89.9 01/15/2020   PLT 337.0 01/15/2020   Lab Results  Component Value Date   ALT 25 10/25/2019   AST 21 10/25/2019   ALKPHOS 88 10/25/2019   BILITOT 0.4 10/25/2019   Lab Results  Component  Value Date   CREATININE 0.76 12/26/2019   BUN 16 12/26/2019   NA 137 12/26/2019   K 4.3 12/26/2019   CL 101 12/26/2019   CO2 28 12/26/2019     Imaging Studies: No results found.

## 2020-03-26 ENCOUNTER — Other Ambulatory Visit: Payer: Self-pay | Admitting: Internal Medicine

## 2020-03-26 ENCOUNTER — Telehealth: Payer: Self-pay

## 2020-03-26 NOTE — Addendum Note (Signed)
Addended by: Freada Bergeron on: 03/26/2020 09:56 AM   Modules accepted: Orders

## 2020-03-26 NOTE — Telephone Encounter (Signed)
Returned call: Per Dr Claiborne Billings: Order placed to [place]: Recommend an initial trial of CPAP therapy with EPR of 3 at 19 cm H2O with heated humidification. A Medium size Fisher[employer]Paykel Full Face Mask Simplus mask was used for the study. Patient notified.

## 2020-03-26 NOTE — Telephone Encounter (Signed)
PA was submitted for Dexilant 60 mg on [email or web address]. waiting on an approval or denial.

## 2020-04-03 DIAGNOSIS — F331 Major depressive disorder, recurrent, moderate: Secondary | ICD-10-CM | POA: Diagnosis not present

## 2020-04-09 ENCOUNTER — Ambulatory Visit: Payer: Self-pay | Admitting: Nurse Practitioner

## 2020-04-09 ENCOUNTER — Other Ambulatory Visit: Payer: Self-pay | Admitting: Internal Medicine

## 2020-04-09 MED FILL — METFORMIN HCL 500 MG TABS: 500 | 30 days supply | Qty: 15 | Fill #1

## 2020-04-09 MED FILL — GABAPENTIN 300 MG CAPSULE: 300 | 30 days supply | Qty: 120 | Fill #0

## 2020-04-09 MED FILL — DEXILANT DR 60 MG CAPSULE: 60 | 30 days supply | Qty: 30 | Fill #7

## 2020-04-15 DIAGNOSIS — M79641 Pain in right hand: Secondary | ICD-10-CM | POA: Diagnosis not present

## 2020-04-15 DIAGNOSIS — M545 Low back pain: Secondary | ICD-10-CM | POA: Diagnosis not present

## 2020-04-15 DIAGNOSIS — E1043 Type 1 diabetes mellitus with diabetic autonomic (poly)neuropathy: Secondary | ICD-10-CM | POA: Diagnosis not present

## 2020-04-15 DIAGNOSIS — M25552 Pain in left hip: Secondary | ICD-10-CM | POA: Diagnosis not present

## 2020-04-15 DIAGNOSIS — M79672 Pain in left foot: Secondary | ICD-10-CM | POA: Diagnosis not present

## 2020-04-15 DIAGNOSIS — G894 Chronic pain syndrome: Secondary | ICD-10-CM | POA: Diagnosis not present

## 2020-04-15 DIAGNOSIS — M25562 Pain in left knee: Secondary | ICD-10-CM | POA: Diagnosis not present

## 2020-04-15 DIAGNOSIS — Z6841 Body Mass Index (BMI) 40.0 and over, adult: Secondary | ICD-10-CM | POA: Diagnosis not present

## 2020-04-15 DIAGNOSIS — M79671 Pain in right foot: Secondary | ICD-10-CM | POA: Diagnosis not present

## 2020-04-15 DIAGNOSIS — M25561 Pain in right knee: Secondary | ICD-10-CM | POA: Diagnosis not present

## 2020-04-15 DIAGNOSIS — M25551 Pain in right hip: Secondary | ICD-10-CM | POA: Diagnosis not present

## 2020-04-15 DIAGNOSIS — M79642 Pain in left hand: Secondary | ICD-10-CM | POA: Diagnosis not present

## 2020-04-16 ENCOUNTER — Other Ambulatory Visit (HOSPITAL_COMMUNITY)
Admission: RE | Admit: 2020-04-16 | Discharge: 2020-04-16 | Disposition: A | Discharge status: Home / Self Care | Payer: Medicaid Other | Source: Ambulatory Visit | Attending: Internal Medicine | Admitting: Internal Medicine

## 2020-04-16 DIAGNOSIS — Z20822 Contact with and (suspected) exposure to covid-19: Secondary | ICD-10-CM | POA: Insufficient documentation

## 2020-04-16 LAB — SARS CORONAVIRUS 2 (TAT 6-24 HRS): SARS Coronavirus 2: NEGATIVE

## 2020-04-19 ENCOUNTER — Ambulatory Visit (INDEPENDENT_AMBULATORY_CARE_PROVIDER_SITE_OTHER): Payer: Medicaid Other | Admitting: Internal Medicine

## 2020-04-19 ENCOUNTER — Encounter: Payer: Self-pay | Admitting: Internal Medicine

## 2020-04-19 DIAGNOSIS — R0609 Other forms of dyspnea: Secondary | ICD-10-CM

## 2020-04-19 DIAGNOSIS — R05 Cough: Secondary | ICD-10-CM | POA: Diagnosis not present

## 2020-04-19 DIAGNOSIS — R0600 Dyspnea, unspecified: Secondary | ICD-10-CM | POA: Diagnosis not present

## 2020-04-19 DIAGNOSIS — R058 Other specified cough: Secondary | ICD-10-CM

## 2020-04-19 NOTE — Progress Notes (Signed)
Subjective:     Patient ID: Robin [name], female   DOB: [birth year]       MRN: [id number]     Brief patient profile:  37  yobf quit smoking in 1994 seen previously for dyspnea, chronic allergic rhinitis and hemoptysis (felt secondary to erosions from tonsils status post bronchoscopy) - has sensation of globus/ dysphagia dating back at least to 2009   Has seen Dr Robin [name] for pos allergies but does not recall details (even symptoms)      TESTS  PFT 02/09/17: FVC  2.35 L (77%) FEV1 2.06 L (85%) FEV1/FVC 0.88 FEF 25-75 3.32 L (142%) negative bronchodilator response TLC 3.74 L (68%) RV 71% ERV 28% DLCO corrected 71% 08/22/15: FVC 2.32 L (119%) FEV1 1.99 L (121%) FEV1/FVC 0.85 FEF 25-75 2.59 L (118%)  6MWT 02/16/17:  Walked 126 meters / Baseline Sat 99%  On RA / Nadir Sat 99% on RA  METHACHOLINE CHALLENGE TEST (02/24/17):  Normal bronchial hyperreactivity.  IMAGING BARIUM SWALLOW/ESOPHAGRAM 04/30/17 (per radiologist):  Hiatal hernia with mild reflux. Esophageal dysmotility  CT NECK/SOFT TISSUE W/ CONTRAST 04/19/17 (per radiologist): No inflammatory change or foreign body can be seen in the pharynx.  Cervical spondylosis. 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  CT CHEST W/O 01/07/17   No parenchymal nodule, mass, or opacity appreciated. No pleural effusion or thickening. No pericardial effusion. No pathologic mediastinal adenopathy.  CTA CHEST 12/13/15   No pulmonary emboli. No pleural effusion or thickening. No pericardial effusion. No pathologic mediastinal adenopathy. No parenchymal nodule, mass, or opacification.  CARDIAC TTE (09/29/16): LV normal in size with moderate concentric hypertrophy. EF 55-60% with no regional wall motion abnormalities. Indeterminant diastolic function. LA & RA normal in size. RV normal in size and function. No aortic stenosis or regurgitation. Aortic root normal in size. No mitral stenosis or regurgitation. No  significant pulmonic regurgitation. No significant tricuspid regurgitation. No pericardial effusion.  LEFT HEART CATHETERIZATION (11/13/3):  Aortic systolic pressure 944  Diastolic pressure 90  Left ventricular systolic pressure 967  End-diastolic pressure 27 left main coronary artery normal  Left anterior descending artery normal  Left circumflex coronary artery normal  Right coronary artery dominant and normal   Overall estimated ejection fraction greater than 60% without focal wall motion abnormality  MICROBIOLOGY Endobronchial/Endotracheal Brushing 03/08/17: Rare Haemophilus influenza beta lactamase positive  PATHOLOGY Endobronchial/Endotracheal Brushing 03/08/17: No malignancy. Negative for herpes & HPV.  LABS 05/28/17 BNP:  6.1 ESR:  34 TSH:  1.060 Anti-CCP:  6 RF:  <10  02/16/17 IgE: 81 RAST panel: Cockroach 0.2 / D. farinae 0.2 / D. pteronyssinus 0.24   12/23/16 INR: 1.0 PTT: 24.1 Chromatin Ab:  <0.2 Smith Ab: <0.2 DS DNA Ab:  <1 SSA:  <0.2 SSB:  <0.2 Anti-CCP:  <16 SCL-70:  <0.2 ENA RNP Ab: Centromere Ab Screen:  <0.2 Jo-1 Ab:  <0.2  05/12/16 ANA: Negative Rheumatoid factor:  <10     02/08/2018 acute extended ov/Catalino Plascencia re:  acute on chronic cough x decades / establish with me Robin [name] pt) Chief Complaint  Patient presents with  . Acute Visit    Pt c/o cough with yellow, wheezing and SOB x 3 days. She states her chest feels sore when she coughs. She has been using her albuterol inahler 3 x daily on average.   baseline = maint protonix right before first meal then before supper, and singuliar and needing saba  3 x  Times a day but not noct then severe  cough /subj  wheeze/ chest soreness 3 days prior to OV and sob with any activity  rec Prednisone 10 mg take  4 each am x 2 days,   2 each am x 2 days,  1 each am x 2 days and stop  zpak Protonix 40  Mg Take 30- 60 min before your first and last meals of the day  For drainage / throat tickle try take  CHLORPHENIRAMINE  4 mg - take one every 4 hours as needed   Take delsym two tsp every 12 hours and supplement if needed with  tramadol 50 mg up to 2 every 4 hours GERD  Diet   Please schedule a follow up office visit in 4 weeks, sooner if needed  with all medications /inhalers/ solutions in hand so we can verify exactly what you are taking. This includes all medications from all doctors and over the counters      03/08/2018  f/u ov/Kassadi Presswood re:  Cough x decades / did not bring all meds but most of them  Chief Complaint  Patient presents with  . Follow-up    Breathing is unchanged. She is still coughing up yellow sputum. She is using her albuterol inhaler 3 x per wk on average.   Dyspnea:  Even if not coughing doe x room to room  X  Years = MMRC3 = can't walk 100 yards even at a slow pace at a flat grade s stopping due to sob  = stops a lot to shop  Cough: fits of cough are  Sporadic daytime mostly  Sleep: she says fine, husband hears wheezing  SABA use:  Doesn't really help/ note MCT neg  rec Stop corevidol and losartan and instead try bisoprolol 5 mg twice daily  For cough try tessalon 200 mg every 8 hours as needed  For drainage / throat tickle try take CHLORPHENIRAMINE  4 mg - take one every 4 hours as needed   Please see patient coordinator before you leave today  to schedule sinus CT> never done denied by insurance          07/19/2018  Acute extended  ov/Robin [name] re: cough / dysphagia/ globus > 10 y worse since off gabapentin 300 mg x 4 weeks with worse doe also / did not bring updated med calendar as requested  Chief Complaint  Patient presents with  . Acute Visit    Increased SOB and cough x 3-4 wks. Her cough is occ prod with green to yellow sputum.  She also c/o hoarseness.   Dyspnea:   MMRC3 = can't walk 100 yards even at a slow pace at a flat grade s stopping due to sob  - can't do  [employer] Cough:  sporadic Sleeping: sleeps fine bed flat/ 2 pillows  SABA use: none Says  tessalon works the best for cough rec Add back gabapentin 300 mg four times a day as per med calendar Change tessilon to 200 mg up to every 8 hours as needed for cough  and stop delsym If can't stop coughing then add hydrocodone up to every 4 hours if needed  If not better next step is to return to ENT ([facility]) then if not satisfied please return here  with all medications /inhalers/ solutions     05/11/2019  f/u ov/Kazaria Gaertner re: uacs / MO with doe /brought meds but they don't correlate well with med list  Chief Complaint  Patient presents with  . Follow-up    Patient reports that   her sob has increased and she still has a dry cough.   Dyspnea:  Was able to walk at [employer], now using scooter x 2 month Cough: still severe cough day > noct, dry > wet with globus sensatin and hoarseness but still subj "wheezeling"  Sleeping: sleeps ok  in flat bed, several pillows SABA use: no better with saba  02: none  Overt hb despite dexilant / no longer on aciphex though still listed rec Prednisone 10 mg take  4 each am x 2 days,   2 each am x 2 days,  1 each am x 2 days and stop  GERD (REFLUX)   For congestion ok to chlotrabs 4 mg x 2 every hours as needed  We will  schedule ENT eval/ [facility] re your cough    12/04/2019  f/u ov/Alechia Lezama re: uacs  Chief Complaint  Patient presents with  . Follow-up    Increased cough and congestion x 4 months. She will occ produce some yellow to green sputum. She is using her albuterol inhaler about 2 x per day on average.   Dyspnea:  Uses walker across the room then stops < 25 ft x months  Cough: esp daytime min mucoid Sleeping: on side flat bed 4 pillows  SABA use: 2 different albuterols/ easily confused with details of care  02: none  rec Prednisone 10 mg take  4 each am x 2 days,   2 each am x 2 days,  1 each am x 2 days and stop  Try dexilant 60 mg   Take 30-60 min before first meal of the day and Pepcid ac (famotidine) 20 mg one after supper until return to office   GERD diet  For congestion ok to chlotrabs 4 mg x 2 every hours as needed  Only use your albuterol as a rescue medication to be used if you can't catch your breath  See  ENT eval/ [facility] re your cough and ears   01/15/2020  f/u ov/Achillies Buehl re: uacs/ ? Allergic rhinitis with pnds (w/u by [facility] previously and see last RAST 2018 ) / did not bring chlortabs but says taking 2 bid Chief Complaint  Patient presents with  . Follow-up    Coughing less since the last visit but she states her SOB and wheezing seem worse. She is using her albuterol inhaler at least 3 x per day.   Dyspnea: room to room really no better p pred / more limited by L hip/knee pain Cough: better p prednisone, some worse since completed Sleeping: flat bed with 4 pillows wakes up to br p 3-4 h and cough and wheeze when walking to BR SABA use: during the day with activity (0% effective technique - see a/p)  02: none  rec Work on inhaler technique:  relax and gently blow all the way out then take a nice smooth deep breath back in, triggering the inhaler at same time you start breathing in.  Rinse and gargle with water when done Try albuterol (ventolin) 15 min before an activity that you know would make you short of breath and see if it makes any difference and if makes none then don't take it after activity unless you can't catch your breath. If your breathing gets worse > Prednisone 10 mg take  4 each am x 2 days,   2 each am x 2 days,  1 each am x 2 days and stop  For drainage / throat tickle try take CHLORPHENIRAMINE  4 mg  (  Chlortab '4mg'$   at [employer] should be easiest to find in the green box)  take one-two  every 4 hours as needed - available over the counter- may cause drowsiness so start with just a bedtime dose or two and see how you tolerate it before trying in daytime        04/19/2020  f/u ov/Adriahna Shearman re: sob/cough x 2009 / had both covid  shots Chief Complaint  Patient presents with  . Follow-up  Dyspnea: 25 ft  and stops due to sob / knees hurt  Cough: some worse mostly daytime/ smells trigger esp cig smoke/ sense of pnds / resolves with prednisone short term   Sleeping:  30 degrees elevation hob with electric bed does fine unless needs to get up to pee  SABA use: avg once a day  02: none    No obvious day to day or daytime variability or assoc excess/ purulent sputum or mucus plugs or hemoptysis or cp or chest tightness, subjective wheeze or overt sinus or hb symptoms.   Sleeping  without nocturnal  or early am exacerbation  of respiratory  c/o's or need for noct saba. Also denies any obvious fluctuation of symptoms with weather or environmental changes or other aggravating or alleviating factors except as outlined above   No unusual exposure hx or h/o childhood pna/ asthma or knowledge of premature birth.  Current Allergies, Complete Past Medical History, Past Surgical History, Family History, and Social History were reviewed in [employer] record.  ROS  The following are not active complaints unless bolded Hoarseness, sore throat, dysphagia= globus, dental problems, itching, sneezing,  nasal congestion or discharge of excess mucus or purulent secretions, ear ache,   fever, chills, sweats, unintended wt loss or wt gain, classically pleuritic or exertional cp,  orthopnea pnd or arm/hand swelling  or leg swelling, presyncope, palpitations, abdominal pain, anorexia, nausea, vomiting, diarrhea  or change in bowel habits or change in bladder habits, change in stools or change in urine, dysuria, hematuria,  rash, arthralgias, visual complaints, headache, numbness, weakness or ataxia or problems with walking or coordination,  change in mood or  memory.        Current Meds  Medication Sig  . ACCU-CHEK AVIVA PLUS test strip USE AS DIRECTED  . acetaminophen (TYLENOL) 500 MG tablet Take 1,000 mg by mouth 2 (two) times daily.   Marland Kitchen albuterol (VENTOLIN HFA) 108 (90 Base) MCG/ACT inhaler  Inhale 1-2 puffs into the lungs every 6 (six) hours as needed for wheezing or shortness of breath.  Marland Kitchen amitriptyline (ELAVIL) 50 MG tablet TAKE 1 TABLET BY MOUTH AT BEDTIME.  Marland Kitchen aspirin EC 81 MG tablet Take 81 mg by mouth at bedtime.  Marland Kitchen atorvastatin (LIPITOR) 20 MG tablet Take 0.5 tablets (10 mg total) by mouth daily.  Marland Kitchen buPROPion (WELLBUTRIN XL) 300 MG 24 hr tablet TAKE 1 TABLET (300 MG TOTAL) BY MOUTH EVERY MORNING.  . calcium carbonate (TUMS - DOSED IN MG ELEMENTAL CALCIUM) 500 MG chewable tablet Chew 2 tablets by mouth at bedtime.  . celecoxib (CELEBREX) 100 MG capsule Take 100 mg by mouth 2 (two) times daily.  . cetirizine (ZYRTEC) 10 MG tablet Take 1 tablet (10 mg total) by mouth daily.  . chlorpheniramine (CHLOR-TRIMETON) 4 MG tablet Take 4 mg by mouth every 4 (four) hours as needed for allergies.   . Cholecalciferol 1.25 MG (50000 UT) capsule Take 50,000 Units by mouth once a week.   Marland Kitchen CINNAMON PO Take 1  capsule by mouth daily.  Marland Kitchen DEXILANT 60 MG capsule TAKE 1 CAPSULE (60 MG TOTAL) BY MOUTH DAILY. (Patient taking differently: Take 60 mg by mouth daily. )  . diclofenac sodium (VOLTAREN) 1 % GEL Apply 2-4 g topically 4 (four) times daily as needed (PAIN.).  Marland Kitchen DULoxetine (CYMBALTA) 30 MG capsule TAKE 1 CAPSULE (30 MG TOTAL) BY MOUTH DAILY.  . famotidine (PEPCID) 20 MG tablet One after supper (Patient taking differently: Take 20 mg by mouth every evening. One after supper)  . fluticasone (FLONASE) 50 MCG/ACT nasal spray Place 1 spray into both nostrils daily.  . furosemide (LASIX) 40 MG tablet TAKE 1/2 TO 1 TABLET BY MOUTH DAILY FOR LOWER EXTREMITY SWELLING (Patient taking differently: Take 20-40 mg by mouth daily. )  . gabapentin (NEURONTIN) 300 MG capsule TAKE 1 CAPSULE (300 MG TOTAL) BY MOUTH 4 (FOUR) TIMES DAILY.  Marland Kitchen glucose blood (ONETOUCH ULTRA) test strip Use as instructed  . glucose blood test strip Accu-Chek Aviva Plus test strips  . glucose blood test strip   . hydrALAZINE  (APRESOLINE) 50 MG tablet Take 1 tablet (50 mg total) by mouth 3 (three) times daily.  Marland Kitchen levothyroxine (SYNTHROID) 88 MCG tablet Take 1 tablet (88 mcg total) by mouth daily.  Marland Kitchen LINZESS 290 MCG CAPS capsule TAKE 1 CAPSULE BY MOUTH DAILY BEFORE BREAKFAST. (Patient taking differently: Take 290 mcg by mouth daily. )  . metFORMIN (GLUCOPHAGE) 500 MG tablet TAKE 1/2 TABLET DAILY WITH BREAKFAST  . Metoprolol Tartrate 75 MG TABS TAKE 1 TABLET BY MOUTH TWICE DAILY.  . montelukast (SINGULAIR) 10 MG tablet TAKE 1 TABLET BY MOUTH AT BEDTIME.  . Multiple Vitamin (MULTIVITAMIN WITH MINERALS) TABS tablet Take 1 tablet by mouth daily. Alive Women's Multivitamin  . Omega-3 Fatty Acids (FISH OIL) 1000 MG CAPS Take 1,000 mg by mouth every evening.  Marland Kitchen OVER THE COUNTER MEDICATION Take 1,000 mg by mouth daily. BEET ROOT   . oxyCODONE-acetaminophen (PERCOCET) 10-325 MG tablet Take 1 tablet by mouth 2 (two) times daily.   . polyethylene glycol-electrolytes (TRILYTE) 420 g solution Take 4,000 mLs by mouth as directed.  Marland Kitchen PREMARIN vaginal cream Place 1 Applicatorful vaginally at bedtime.   . sodium chloride (OCEAN) 0.65 % SOLN nasal spray Place 2 sprays into both nostrils every 4 (four) hours as needed for congestion.  Marland Kitchen spironolactone (ALDACTONE) 25 MG tablet Take 1 tablet (25 mg total) by mouth daily.  . vitamin B-12 (CYANOCOBALAMIN) 1000 MCG tablet Take 1,000 mcg by mouth daily.                   Objective:   Physical Exam  W/c bound obese bf nad    04/19/2020         335  01/15/2020         354  12/04/2019           355  05/11/2019           331  07/19/2018       328  03/08/2018            337   02/08/18 (!) 333 lb (151 kg)  01/27/18 (!) 328 lb 9.6 oz (149.1 kg)  12/14/17 (!) 326 lb 6.4 oz (148.1 kg)    Vital signs reviewed  04/19/2020  - Note at rest 02 sats  96% on RA     HEENT : pt wearing mask not removed for exam due to covid -19 concerns.    NECK :  without JVD/Nodes/TM/ nl carotid upstrokes  bilaterally   LUNGS: no acc muscle use,  Nl contour chest which is clear to A and P bilaterally without cough on insp or exp maneuvers   CV:  RRR  no s3 or murmur or increase in P2, and  Trace ankle pitting bilaterally  ABD:  Quite obese soft and nontender with limited  inspiratory excursion.  No bruits or organomegaly appreciated, bowel sounds nl  MS:   ext warm without deformities, calf tenderness, cyanosis or clubbing No obvious joint restrictions   SKIN: warm and dry without lesions    NEURO:  alert, approp, nl sensorium with  no motor or cerebellar deficits apparent.             Assessment:

## 2020-04-19 NOTE — Patient Instructions (Signed)
See Dr [name] or her group and let them know prednisone helps your symptoms   Please schedule a follow up visit in 3 months with PFTs on return -  but call sooner if needed

## 2020-04-20 ENCOUNTER — Encounter: Payer: Self-pay | Admitting: Internal Medicine

## 2020-04-20 NOTE — Assessment & Plan Note (Signed)
Onset 2009? Allergy profile 02/16/17  >  Eos 0.1 /  IgE  81 pos dust/ cockroach  - MCT 02/24/17 neg  - DgEs  01/20/18 : Small reducible hiatal hernia with trace gastroesophageal reflux. No evidence of stricture. - Cyclical cough protocol 02/08/2018  - sinus CT 03/10/2018 >>> informed  Insurance decllned> referred back to ENT Erik Obey) 21/19/4174 - worse off gabapentin 07/19/2018 > restart 300 qid  - Allergy profile 01/15/2020 >  Eos 0.1 /  IgE  39 neg RAST - 04/19/2020 reports better p prednisone > referred back to Dr [name] practice

## 2020-04-20 NOTE — Assessment & Plan Note (Signed)
[facility]  09/29/16 LVEDP 27  TTE (09/29/16): LV normal in size with moderate concentric hypertrophy. EF 55-60% with no regional wall motion abnormalities. Indeterminant diastolic function. LA & RA normal in size. RV normal in size and function. No aortic stenosis or regurgitation. Aortic root normal in size. No mitral stenosis or regurgitation. No significant pulmonic regurgitation. No significant tricuspid regurgitation. No pericardial effusion. - 03/08/2018   Walked RA x one lap @ 185 stopped due to  Tired > sob/ slow pace no desat - Spirometry 03/08/2018  FEV1 1.55 (63%)  Ratio 84 with min curvature - try off losartan/ coreg 03/08/2018  > no better as of 07/19/2018  - 07/19/2018   Walked RA x one lap @ 185 stopped due to sob with nl sats, fast pace considering she was using cane. - 05/11/2019   Walked RA x one lap =  approx 250 ft - stopped due to  Sob and back/hip/leg pain but sats still 99% at end     Main issues are related to obesity/ diastolic dysfunction > needs full pfts to sort out obst vs restrictive components          Each maintenance medication was reviewed in detail including emphasizing most importantly the difference between maintenance and prns and under what circumstances the prns are to be triggered using an action plan format where appropriate.  Total time for H and P, chart review, counseling,   and generating customized AVS unique to this office visit / charting = 15 min

## 2020-04-24 ENCOUNTER — Ambulatory Visit: Payer: Medicaid Other | Admitting: Podiatry

## 2020-04-24 ENCOUNTER — Encounter: Payer: Self-pay | Admitting: Podiatry

## 2020-04-24 ENCOUNTER — Other Ambulatory Visit: Payer: Self-pay

## 2020-04-24 DIAGNOSIS — M79674 Pain in right toe(s): Secondary | ICD-10-CM

## 2020-04-24 DIAGNOSIS — E1142 Type 2 diabetes mellitus with diabetic polyneuropathy: Secondary | ICD-10-CM

## 2020-04-24 DIAGNOSIS — M79675 Pain in left toe(s): Secondary | ICD-10-CM

## 2020-04-24 DIAGNOSIS — B351 Tinea unguium: Secondary | ICD-10-CM | POA: Diagnosis not present

## 2020-04-25 ENCOUNTER — Other Ambulatory Visit: Payer: Self-pay | Admitting: Internal Medicine

## 2020-04-25 MED FILL — LEVOTHYROXINE 88 MCG TABLET: 88 | 30 days supply | Qty: 30 | Fill #0

## 2020-04-28 NOTE — Progress Notes (Signed)
Subjective:  Patient ID: Robin [name], female    DOB: May 23, 1956,  MRN: [id number]  Robin [name] presents to clinic today for preventative diabetic foot care and painful thick toenails that are difficult to trim. Pain interferes with ambulation. Aggravating factors include wearing enclosed shoe gear. Pain is relieved with periodic professional debridement..  64 y.o. female presents with the above complaint.  Reports painfully elongated nails to both feet.  Review of Systems: Negative except as noted in the HPI. Past Medical History:  Diagnosis Date  . Anxiety disorder   . Arthritis   . Asthma   . Chronic neck pain   . Constipation   . Depression   . Diabetes mellitus without complication ([place])   . Dyspnea   . GERD (gastroesophageal reflux disease)   . Hiatal hernia    small  . Hyperlipidemia   . Hypertension   . Hypothyroidism   . Schatzki's ring    non critical  . Sleep apnea    CPAP, Sleep study at [place] [facility]  . Sleep apnea    Past Surgical History:  Procedure Laterality Date  . ABDOMINAL HYSTERECTOMY    . ABDOMINAL HYSTERECTOMY  02/18/2000  . CARDIAC CATHETERIZATION  08/18/2003   normal L main/LAD/L Cfx/RCA (Dr. Adora Fridge)  . COLONOSCOPY  2006   Dr. [name], hyperplastic polyps  . COLONOSCOPY  08/06/11   abnormal terminal ileum for 10cm, erosions, geographical ulceration. Bx small bowel mucosa with prominent intramucosal lymphoid aggregates, slightly inflammed  . COLONOSCOPY WITH PROPOFOL N/A 12/28/2019   Rourk: 4 sessile serrated adenomas removed on the colon.  Next colonoscopy in 3 years.  Marland Kitchen DIRECT LARYNGOSCOPY  03/15/2012   Procedure: DIRECT LARYNGOSCOPY;  Surgeon: [name], MD;  Location: [facility];  Service: ENT;  Laterality: N/A; Dr. Wolicki--:>no foreign body seen. normal esophagus to 40cm  . ESOPHAGEAL DILATION  04/17/2015   Procedure: ESOPHAGEAL DILATION;  Surgeon: [name], MD;  [facility];  Service: Endoscopy;;  .  ESOPHAGOGASTRODUODENOSCOPY  08/23/2008   POE:UMPN distal esophageal erosions consistent with mild erosive reflux esophagitis, otherwise unremarkable esophagus/ Tiny antral erosions of doubtful clinical significance, otherwise normal stomach, patent pylorus, normal D1 and D2  . ESOPHAGOGASTRODUODENOSCOPY  08/06/11   small hh, noncritical Schatzki's ring s/p 75 F  . ESOPHAGOGASTRODUODENOSCOPY N/A 04/17/2015   TIR:WERXVQ s/p dilation  . ESOPHAGOGASTRODUODENOSCOPY (EGD) WITH PROPOFOL N/A 01/20/2018   Dr. [name]: Erosive gastropathy, normal-appearing esophagus status post empiric dilation.  Chronic gastritis, no H. pylori.  . ESOPHAGOSCOPY  03/15/2012   Procedure: ESOPHAGOSCOPY;  Surgeon: [name], MD;  Location: [place] [facility];  Service: ENT;  Laterality: N/A;  . KNEE ARTHROSCOPY  04/27/2001  . MALONEY DILATION N/A 01/20/2018   Procedure: Venia Minks DILATION;  Surgeon: [name], MD;  [facility];  Service: Endoscopy;  Laterality: N/A;  . [place]  2003   negative bruce protocol exercise tress test; EF 68%; intermediate risk study due to evidence of anterior wall ischemia extending from mid-ventricle to apex  . PITUITARY SURGERY  06/2012   benign tumor, surgeon at [facility]  . POLYPECTOMY  12/28/2019   Procedure: POLYPECTOMY;  Surgeon: [name], MD;  [facility];  Service: Endoscopy;;  . RECTOCELE REPAIR    . TRANSTHORACIC ECHOCARDIOGRAM  2003   EF normal  . VAGINAL PROLAPSE REPAIR    . VIDEO BRONCHOSCOPY Bilateral 03/08/2017   Procedure: VIDEO BRONCHOSCOPY WITHOUT FLUORO;  Surgeon: Tera Partridge  E, MD;  Location: [place];  Service: Cardiopulmonary;  Laterality: Bilateral;    Current Outpatient Medications:  .  ACCU-CHEK AVIVA PLUS test strip, USE AS DIRECTED, Disp: 100 each, Rfl: 12 .  acetaminophen (TYLENOL) 500 MG tablet, Take 1,000 mg by mouth 2 (two) times daily. , Disp: , Rfl:  .  albuterol (VENTOLIN HFA) 108 (90 Base) MCG/ACT inhaler, Inhale  1-2 puffs into the lungs every 6 (six) hours as needed for wheezing or shortness of breath., Disp: 18 g, Rfl: 0 .  amitriptyline (ELAVIL) 50 MG tablet, TAKE 1 TABLET BY MOUTH AT BEDTIME., Disp: 30 tablet, Rfl: 2 .  aspirin EC 81 MG tablet, Take 81 mg by mouth at bedtime., Disp: , Rfl:  .  atorvastatin (LIPITOR) 20 MG tablet, Take 0.5 tablets (10 mg total) by mouth daily., Disp: 90 tablet, Rfl: 3 .  buPROPion (WELLBUTRIN XL) 300 MG 24 hr tablet, TAKE 1 TABLET (300 MG TOTAL) BY MOUTH EVERY MORNING., Disp: 30 tablet, Rfl: 2 .  calcium carbonate (TUMS - DOSED IN MG ELEMENTAL CALCIUM) 500 MG chewable tablet, Chew 2 tablets by mouth at bedtime., Disp: , Rfl:  .  celecoxib (CELEBREX) 100 MG capsule, Take 100 mg by mouth 2 (two) times daily., Disp: , Rfl:  .  cetirizine (ZYRTEC) 10 MG tablet, Take 1 tablet (10 mg total) by mouth daily., Disp: 30 tablet, Rfl: 1 .  chlorpheniramine (CHLOR-TRIMETON) 4 MG tablet, Take 4 mg by mouth every 4 (four) hours as needed for allergies. , Disp: , Rfl:  .  Cholecalciferol 1.25 MG (50000 UT) capsule, Take 50,000 Units by mouth once a week. , Disp: , Rfl:  .  CINNAMON PO, Take 1 capsule by mouth daily., Disp: , Rfl:  .  DEXILANT 60 MG capsule, TAKE 1 CAPSULE (60 MG TOTAL) BY MOUTH DAILY. (Patient taking differently: Take 60 mg by mouth daily. ), Disp: 30 capsule, Rfl: 11 .  diclofenac sodium (VOLTAREN) 1 % GEL, Apply 2-4 g topically 4 (four) times daily as needed (PAIN.)., Disp: , Rfl:  .  DULoxetine (CYMBALTA) 30 MG capsule, TAKE 1 CAPSULE (30 MG TOTAL) BY MOUTH DAILY., Disp: 30 capsule, Rfl: 6 .  famotidine (PEPCID) 20 MG tablet, One after supper (Patient taking differently: Take 20 mg by mouth every evening. One after supper), Disp: 30 tablet, Rfl: 11 .  fluticasone (FLONASE) 50 MCG/ACT nasal spray, Place 1 spray into both nostrils daily., Disp: 16 g, Rfl: 1 .  furosemide (LASIX) 40 MG tablet, TAKE 1/2 TO 1 TABLET BY MOUTH DAILY FOR LOWER EXTREMITY SWELLING (Patient  taking differently: Take 20-40 mg by mouth daily. ), Disp: 90 tablet, Rfl: 2 .  gabapentin (NEURONTIN) 300 MG capsule, TAKE 1 CAPSULE (300 MG TOTAL) BY MOUTH 4 (FOUR) TIMES DAILY., Disp: 120 capsule, Rfl: 0 .  glucose blood (ONETOUCH ULTRA) test strip, Use as instructed, Disp: 100 each, Rfl: 12 .  glucose blood test strip, Accu-Chek Aviva Plus test strips, Disp: , Rfl:  .  glucose blood test strip, , Disp: , Rfl:  .  hydrALAZINE (APRESOLINE) 50 MG tablet, Take 1 tablet (50 mg total) by mouth 3 (three) times daily., Disp: 270 tablet, Rfl: 1 .  levothyroxine (SYNTHROID) 88 MCG tablet, TAKE 1 TABLET (88 MCG TOTAL) BY MOUTH DAILY., Disp: 30 tablet, Rfl: 2 .  LINZESS 290 MCG CAPS capsule, TAKE 1 CAPSULE BY MOUTH DAILY BEFORE BREAKFAST. (Patient taking differently: Take 290 mcg by mouth daily. ), Disp: 90 capsule, Rfl: 3 .  meloxicam (  MOBIC) 15 MG tablet, Take 15 mg by mouth daily., Disp: , Rfl:  .  metFORMIN (GLUCOPHAGE) 500 MG tablet, TAKE 1/2 TABLET DAILY WITH BREAKFAST, Disp: 15 tablet, Rfl: 2 .  Metoprolol Tartrate 75 MG TABS, TAKE 1 TABLET BY MOUTH TWICE DAILY., Disp: 60 tablet, Rfl: 2 .  montelukast (SINGULAIR) 10 MG tablet, TAKE 1 TABLET BY MOUTH AT BEDTIME., Disp: 30 tablet, Rfl: 2 .  Multiple Vitamin (MULTIVITAMIN WITH MINERALS) TABS tablet, Take 1 tablet by mouth daily. Alive Women's Multivitamin, Disp: , Rfl:  .  Omega-3 Fatty Acids (FISH OIL) 1000 MG CAPS, Take 1,000 mg by mouth every evening., Disp: , Rfl:  .  ondansetron (ZOFRAN) 4 MG tablet, Take 4 mg by mouth at bedtime., Disp: , Rfl:  .  OVER THE COUNTER MEDICATION, Take 1,000 mg by mouth daily. BEET ROOT , Disp: , Rfl:  .  oxyCODONE-acetaminophen (PERCOCET) 10-325 MG tablet, Take 1 tablet by mouth 2 (two) times daily. , Disp: , Rfl:  .  polyethylene glycol-electrolytes (TRILYTE) 420 g solution, Take 4,000 mLs by mouth as directed., Disp: 4000 mL, Rfl: 0 .  PREMARIN vaginal cream, Place 1 Applicatorful vaginally at bedtime. , Disp: ,  Rfl: 3 .  sodium chloride (OCEAN) 0.65 % SOLN nasal spray, Place 2 sprays into both nostrils every 4 (four) hours as needed for congestion., Disp: , Rfl:  .  spironolactone (ALDACTONE) 25 MG tablet, Take 1 tablet (25 mg total) by mouth daily., Disp: 30 tablet, Rfl: 10 .  valACYclovir (VALTREX) 500 MG tablet, Take 500 mg by mouth 2 (two) times daily., Disp: , Rfl:  .  vitamin B-12 (CYANOCOBALAMIN) 1000 MCG tablet, Take 1,000 mcg by mouth daily., Disp: , Rfl:  Allergies  Allergen Reactions  . Celexa [Citalopram Hydrobromide] Other (See Comments)    "Made me feel out of it"  . Gabapentin     Contributed to lower extremity edema  . Norvasc [Amlodipine Besylate]     Edema in lower extremities  . Diflucan [Fluconazole] Rash   Social History   Occupational History    Employer: UNEMPLOYED  Tobacco Use  . Smoking status: Former Smoker    Packs/day: 0.50    Years: 10.00    Pack years: 5.00    Types: Cigarettes    Start date: 07/14/1975    Quit date: 04/04/1993    Years since quitting: 27.0  . Smokeless tobacco: Never Used  . Tobacco comment: 17 yrs ago  Vaping Use  . Vaping Use: Never used  Substance and Sexual Activity  . Alcohol use: No    Alcohol/week: 0.0 standard drinks  . Drug use: No  . Sexual activity: Yes    Objective:   Constitutional Robin [name] is a pleasant 64 y.o. African American female, morbidly obese in NAD.Marland Kitchen AAO x 3.   Vascular Dorsalis pedis pulses palpable bilaterally. Posterior tibial pulses palpable bilaterally. Capillary refill normal to all digits.  No cyanosis or clubbing noted. Pedal hair growth diminished b/l. Pedal hair present. Lower extremity skin temperature gradient within normal limits. No pain with calf compression b/l. No edema noted b/l lower extremities.  Neurologic Normal speech. Oriented to person, place, and time. Epicritic sensation to light touch grossly present bilaterally. Protective sensation intact 5/5 intact bilaterally with 10g  monofilament b/l. Vibratory sensation intact b/l. Proprioception intact bilaterally.  Dermatologic Pedal skin with normal turgor, texture and tone b/l.  Toenails are discolored, thick, elongated, dystrophic with pain on palpation x 10 No open wounds. No skin  lesions. Pedal skin with normal turgor, texture and tone bilaterally. No open wounds bilaterally. No interdigital macerations bilaterally. Toenails 1-5 b/l elongated, discolored, dystrophic, thickened, crumbly with subungual debris and tenderness to dorsal palpation.  Orthopedic: Normal muscle strength 5/5 to all lower extremity muscle groups bilaterally. No pain crepitus or joint limitation noted with ROM b/l. No gross bony deformities bilaterally. No bony tenderness.    Radiographs: None Assessment:   1. Pain due to onychomycosis of toenails of both feet   2. Diabetic peripheral neuropathy associated with type 2 diabetes mellitus ([place])    Plan:  Patient was evaluated and treated and all questions answered.  Onychomycosis with pain -Nails palliatively debridement as below -Educated on self-care  Procedure: Nail Debridement Rationale: Pain Type of Debridement: manual, sharp debridement. Instrumentation: Nail nipper, rotary burr. Number of Nails: 10 -Examined patient. -No new findings. No new orders. -Continue diabetic foot care principles. -Toenails 1-5 b/l were debrided in length and girth with sterile nail nippers and dremel without iatrogenic bleeding.  -Patient to report any pedal injuries to medical professional immediately. -Patient to continue soft, supportive shoe gear daily. -Patient/POA to call should there be question/concern in the interim.  Return in about 3 months (around 07/25/2020) for diabetic nail trim.  Marzetta Board, DPM

## 2020-05-02 ENCOUNTER — Other Ambulatory Visit: Payer: Self-pay | Admitting: Anesthesiology

## 2020-05-02 DIAGNOSIS — M5442 Lumbago with sciatica, left side: Secondary | ICD-10-CM

## 2020-05-09 MED FILL — METFORMIN HCL 500 MG TABS: 500 | 30 days supply | Qty: 15 | Fill #2

## 2020-05-09 MED FILL — DEXILANT DR 60 MG CAPSULE: 60 | 30 days supply | Qty: 30 | Fill #8

## 2020-05-13 MED FILL — LEVOTHYROXINE 88 MCG TABLET: 88 | 30 days supply | Qty: 30 | Fill #0

## 2020-05-15 ENCOUNTER — Other Ambulatory Visit: Payer: Self-pay

## 2020-05-15 ENCOUNTER — Other Ambulatory Visit: Payer: Self-pay | Admitting: Gastroenterology

## 2020-05-15 ENCOUNTER — Encounter: Payer: Self-pay | Admitting: Gastroenterology

## 2020-05-15 ENCOUNTER — Ambulatory Visit (INDEPENDENT_AMBULATORY_CARE_PROVIDER_SITE_OTHER): Payer: Medicaid Other | Admitting: Gastroenterology

## 2020-05-15 VITALS — BP 134/65 | HR 63 | Temp 98.1°F | Ht 68.0 in | Wt 356.6 lb

## 2020-05-15 DIAGNOSIS — K2970 Gastritis, unspecified, without bleeding: Secondary | ICD-10-CM

## 2020-05-15 DIAGNOSIS — K219 Gastro-esophageal reflux disease without esophagitis: Secondary | ICD-10-CM

## 2020-05-15 DIAGNOSIS — K5900 Constipation, unspecified: Secondary | ICD-10-CM

## 2020-05-15 DIAGNOSIS — K2990 Gastroduodenitis, unspecified, without bleeding: Secondary | ICD-10-CM | POA: Diagnosis not present

## 2020-05-15 MED ORDER — ESOMEPRAZOLE MAGNESIUM 40 MG PO CPDR: 40.0000 mg | DELAYED_RELEASE_CAPSULE | Freq: Two times a day (BID) | ORAL | 5 refills | Status: DC

## 2020-05-15 MED ORDER — FAMOTIDINE 20 MG PO TABS: 20.0000 mg | ORAL_TABLET | Freq: Two times a day (BID) | ORAL | 3 refills | Status: DC | PRN

## 2020-05-15 NOTE — Patient Instructions (Addendum)
1. Stop Dexilant. Start Nexium 40mg  twice daily before breakfast and evening meal. RX sent to your pharmacy. 2. You can also take Pepcid 20mg  twice daily as needed for breakthrough burning and reflux. RX sent to your pharmacy.  3. Eliminate carbonated beverages from your diet. 4. Eat small meals throughout the day, avoid overeating.  5. Avoid greasy, fatty foods that will increase your burning, reflux. 6. Avoid citrus fruits.  7. Reduction in your weight would be beneficial for reflux management.  8. Call in few weeks and let us know if changes today are helpful in management of your symptoms.    Food Choices for Gastroesophageal Reflux Disease, Adult When you have gastroesophageal reflux disease (GERD), the foods you eat and your eating habits are very important. Choosing the right foods can help ease the discomfort of GERD. Consider working with a diet and nutrition specialist (dietitian) to help you make healthy food choices. What general guidelines should I follow?  Eating plan  Choose healthy foods low in fat, such as fruits, vegetables, whole grains, low-fat dairy products, and lean meat, fish, and poultry.  Eat frequent, small meals instead of three large meals each day. Eat your meals slowly, in a relaxed setting. Avoid bending over or lying down until 2-3 hours after eating.  Limit high-fat foods such as fatty meats or fried foods.  Limit your intake of oils, butter, and shortening to less than 8 teaspoons each day.  Avoid the following: ? Foods that cause symptoms. These may be different for different people. Keep a food diary to keep track of foods that cause symptoms. ? Alcohol. ? Drinking large amounts of liquid with meals. ? Eating meals during the 2-3 hours before bed.  Cook foods using methods other than frying. This may include baking, grilling, or broiling. Lifestyle  Maintain a healthy weight. Ask your health care provider what weight is healthy for you. If you  need to lose weight, work with your health care provider to do so safely.  Exercise for at least 30 minutes on 5 or more days each week, or as told by your health care provider.  Avoid wearing clothes that fit tightly around your waist and chest.  Do not use any products that contain nicotine or tobacco, such as cigarettes and e-cigarettes. If you need help quitting, ask your health care provider.  Sleep with the head of your bed raised. Use a wedge under the mattress or blocks under the bed frame to raise the head of the bed. What foods are not recommended? The items listed may not be a complete list. Talk with your dietitian about what dietary choices are best for you. Grains Pastries or quick breads with added fat. [place] toast. Vegetables Deep fried vegetables. [place] fries. Any vegetables prepared with added fat. Any vegetables that cause symptoms. For some people this may include tomatoes and tomato products, chili peppers, onions and garlic, and horseradish. Fruits Any fruits prepared with added fat. Any fruits that cause symptoms. For some people this may include citrus fruits, such as oranges, grapefruit, pineapple, and lemons. Meats and other protein foods High-fat meats, such as fatty beef or pork, hot dogs, ribs, ham, sausage, salami and bacon. Fried meat or protein, including fried fish and fried chicken. Nuts and nut butters. Dairy Whole milk and chocolate milk. Sour cream. Cream. Ice cream. Cream cheese. Milk shakes. Beverages Coffee and tea, with or without caffeine. Carbonated beverages. Sodas. Energy drinks. Fruit juice made with acidic fruits (such as  orange or grapefruit). Tomato juice. Alcoholic drinks. Fats and oils Butter. Margarine. Shortening. Ghee. Sweets and desserts Chocolate and cocoa. Donuts. Seasoning and other foods Pepper. Peppermint and spearmint. Any condiments, herbs, or seasonings that cause symptoms. For some people, this may include curry, hot sauce,  or vinegar-based salad dressings. Summary  When you have gastroesophageal reflux disease (GERD), food and lifestyle choices are very important to help ease the discomfort of GERD.  Eat frequent, small meals instead of three large meals each day. Eat your meals slowly, in a relaxed setting. Avoid bending over or lying down until 2-3 hours after eating.  Limit high-fat foods such as fatty meat or fried foods. This information is not intended to replace advice given to you by your health care provider. Make sure you discuss any questions you have with your health care provider. Document Revised: 01/12/2019 Document Reviewed: 09/22/2016 Elsevier Patient Education  [place].   Gastroesophageal Reflux Disease, Adult Gastroesophageal reflux (GER) happens when acid from the stomach flows up into the tube that connects the mouth and the stomach (esophagus). Normally, food travels down the esophagus and stays in the stomach to be digested. However, when a person has GER, food and stomach acid sometimes move back up into the esophagus. If this becomes a more serious problem, the person may be diagnosed with a disease called gastroesophageal reflux disease (GERD). GERD occurs when the reflux:  Happens often.  Causes frequent or severe symptoms.  Causes problems such as damage to the esophagus. When stomach acid comes in contact with the esophagus, the acid may cause soreness (inflammation) in the esophagus. Over time, GERD may create small holes (ulcers) in the lining of the esophagus. What are the causes? This condition is caused by a problem with the muscle between the esophagus and the stomach (lower esophageal sphincter, or LES). Normally, the LES muscle closes after food passes through the esophagus to the stomach. When the LES is weakened or abnormal, it does not close properly, and that allows food and stomach acid to go back up into the esophagus. The LES can be weakened by certain  dietary substances, medicines, and medical conditions, including:  Tobacco use.  Pregnancy.  Having a hiatal hernia.  Alcohol use.  Certain foods and beverages, such as coffee, chocolate, onions, and peppermint. What increases the risk? You are more likely to develop this condition if you:  Have an increased body weight.  Have a connective tissue disorder.  Use NSAID medicines. What are the signs or symptoms? Symptoms of this condition include:  Heartburn.  Difficult or painful swallowing.  The feeling of having a lump in the throat.  Abitter taste in the mouth.  Bad breath.  Having a large amount of saliva.  Having an upset or bloated stomach.  Belching.  Chest pain. Different conditions can cause chest pain. Make sure you see your health care provider if you experience chest pain.  Shortness of breath or wheezing.  Ongoing (chronic) cough or a night-time cough.  Wearing away of tooth enamel.  Weight loss. How is this diagnosed? Your health care provider will take a medical history and perform a physical exam. To determine if you have mild or severe GERD, your health care provider may also monitor how you respond to treatment. You may also have tests, including:  A test to examine your stomach and esophagus with a small camera (endoscopy).  A test thatmeasures the acidity level in your esophagus.  A test thatmeasures how much  pressure is on your esophagus.  A barium swallow or modified barium swallow test to show the shape, size, and functioning of your esophagus. How is this treated? The goal of treatment is to help relieve your symptoms and to prevent complications. Treatment for this condition may vary depending on how severe your symptoms are. Your health care provider may recommend:  Changes to your diet.  Medicine.  Surgery. Follow these instructions at home: Eating and drinking   Follow a diet as recommended by your health care provider.  This may involve avoiding foods and drinks such as: ? Coffee and tea (with or without caffeine). ? Drinks that containalcohol. ? Energy drinks and sports drinks. ? Carbonated drinks or sodas. ? Chocolate and cocoa. ? Peppermint and mint flavorings. ? Garlic and onions. ? Horseradish. ? Spicy and acidic foods, including peppers, chili powder, curry powder, vinegar, hot sauces, and barbecue sauce. ? Citrus fruit juices and citrus fruits, such as oranges, lemons, and limes. ? Tomato-based foods, such as red sauce, chili, salsa, and pizza with red sauce. ? Fried and fatty foods, such as donuts, french fries, potato chips, and high-fat dressings. ? High-fat meats, such as hot dogs and fatty cuts of red and white meats, such as rib eye steak, sausage, ham, and bacon. ? High-fat dairy items, such as whole milk, butter, and cream cheese.  Eat small, frequent meals instead of large meals.  Avoid drinking large amounts of liquid with your meals.  Avoid eating meals during the 2-3 hours before bedtime.  Avoid lying down right after you eat.  Do not exercise right after you eat. Lifestyle   Do not use any products that contain nicotine or tobacco, such as cigarettes, e-cigarettes, and chewing tobacco. If you need help quitting, ask your health care provider.  Try to reduce your stress by using methods such as yoga or meditation. If you need help reducing stress, ask your health care provider.  If you are overweight, reduce your weight to an amount that is healthy for you. Ask your health care provider for guidance about a safe weight loss goal. General instructions  Pay attention to any changes in your symptoms.  Take over-the-counter and prescription medicines only as told by your health care provider. Do not take aspirin, ibuprofen, or other NSAIDs unless your health care provider told you to do so.  Wear loose-fitting clothing. Do not wear anything tight around your waist that causes  pressure on your abdomen.  Raise (elevate) the head of your bed about 6 inches (15 cm).  Avoid bending over if this makes your symptoms worse.  Keep all follow-up visits as told by your health care provider. This is important. Contact a health care provider if:  You have: ? New symptoms. ? Unexplained weight loss. ? Difficulty swallowing or it hurts to swallow. ? Wheezing or a persistent cough. ? A hoarse voice.  Your symptoms do not improve with treatment. Get help right away if you:  Have pain in your arms, neck, jaw, teeth, or back.  Feel sweaty, dizzy, or light-headed.  Have chest pain or shortness of breath.  Vomit and your vomit looks like blood or coffee grounds.  Faint.  Have stool that is bloody or black.  Cannot swallow, drink, or eat. Summary  Gastroesophageal reflux happens when acid from the stomach flows up into the esophagus. GERD is a disease in which the reflux happens often, causes frequent or severe symptoms, or causes problems such as damage to the  esophagus.  Treatment for this condition may vary depending on how severe your symptoms are. Your health care provider may recommend diet and lifestyle changes, medicine, or surgery.  Contact a health care provider if you have new or worsening symptoms.  Take over-the-counter and prescription medicines only as told by your health care provider. Do not take aspirin, ibuprofen, or other NSAIDs unless your health care provider told you to do so.  Keep all follow-up visits as told by your health care provider. This is important. This information is not intended to replace advice given to you by your health care provider. Make sure you discuss any questions you have with your health care provider. Document Revised: 03/30/2018 Document Reviewed: 03/30/2018 Elsevier Patient Education  [place].

## 2020-05-15 NOTE — Progress Notes (Signed)
Primary Care Physician: Ladell Pier, MD  Primary Gastroenterologist:  Garfield Cornea, MD   Chief Complaint  Patient presents with  . Gastroesophageal Reflux    Dexilant not helping    HPI: Robin [name] is a 64 y.o. female here for follow-up.  Last seen at time of colonoscopy March 2021 for change in bowel habits.  She had 4 sessile serrated polyps removed from the colon.  Plans for follow-up colonoscopy in 3 years.  Also with history of GERD, constipation, fatty liver.  Last EGD April 2019 showed erosive gastropathy, chronic gastritis on biopsy but no H. pylori.  Normal-appearing esophagus was dilated.  Previously failed omeprazole twice daily, ranitidine.  Pantoprazole and AcipHex twice daily initially helped but lost efficacy.   Feels like Dexilant not working anymore. Takes in the mornings. Also chews TUMS throughout the day. Complains of epigastric burning and heartburn. Doesn't matter what she eats. Happens with bowl of cereal. No dysphagia. No vomiting. On celebrex bid. BM ok with Linzess. No melena, brbpr.     Current Outpatient Medications  Medication Sig Dispense Refill  . ACCU-CHEK AVIVA PLUS test strip USE AS DIRECTED 100 each 12  . acetaminophen (TYLENOL) 500 MG tablet Take 1,000 mg by mouth 2 (two) times daily.     Marland Kitchen albuterol (VENTOLIN HFA) 108 (90 Base) MCG/ACT inhaler Inhale 1-2 puffs into the lungs every 6 (six) hours as needed for wheezing or shortness of breath. 18 g 0  . amitriptyline (ELAVIL) 50 MG tablet TAKE 1 TABLET BY MOUTH AT BEDTIME. 30 tablet 2  . aspirin EC 81 MG tablet Take 81 mg by mouth at bedtime.    Marland Kitchen atorvastatin (LIPITOR) 20 MG tablet Take 0.5 tablets (10 mg total) by mouth daily. 90 tablet 3  . buPROPion (WELLBUTRIN XL) 300 MG 24 hr tablet TAKE 1 TABLET (300 MG TOTAL) BY MOUTH EVERY MORNING. 30 tablet 2  . calcium carbonate (TUMS - DOSED IN MG ELEMENTAL CALCIUM) 500 MG chewable tablet Chew 2 tablets by mouth at bedtime.    . celecoxib  (CELEBREX) 100 MG capsule Take 100 mg by mouth 2 (two) times daily.    . cetirizine (ZYRTEC) 10 MG tablet Take 1 tablet (10 mg total) by mouth daily. 30 tablet 1  . chlorpheniramine (CHLOR-TRIMETON) 4 MG tablet Take 4 mg by mouth every 4 (four) hours as needed for allergies.     . Cholecalciferol 1.25 MG (50000 UT) capsule Take 50,000 Units by mouth once a week.     Marland Kitchen CINNAMON PO Take 1 capsule by mouth daily.    Marland Kitchen DEXILANT 60 MG capsule TAKE 1 CAPSULE (60 MG TOTAL) BY MOUTH DAILY. (Patient taking differently: Take 60 mg by mouth daily. ) 30 capsule 11  . diclofenac sodium (VOLTAREN) 1 % GEL Apply 2-4 g topically 4 (four) times daily as needed (PAIN.).    Marland Kitchen DULoxetine (CYMBALTA) 30 MG capsule TAKE 1 CAPSULE (30 MG TOTAL) BY MOUTH DAILY. 30 capsule 6  . fluticasone (FLONASE) 50 MCG/ACT nasal spray Place 1 spray into both nostrils daily. 16 g 1  . furosemide (LASIX) 40 MG tablet TAKE 1/2 TO 1 TABLET BY MOUTH DAILY FOR LOWER EXTREMITY SWELLING (Patient taking differently: Take 20-40 mg by mouth daily. ) 90 tablet 2  . gabapentin (NEURONTIN) 300 MG capsule TAKE 1 CAPSULE (300 MG TOTAL) BY MOUTH 4 (FOUR) TIMES DAILY. 120 capsule 0  . glucose blood (ONETOUCH ULTRA) test strip Use as instructed 100 each 12  .  glucose blood test strip Accu-Chek Aviva Plus test strips    . glucose blood test strip     . hydrALAZINE (APRESOLINE) 50 MG tablet Take 1 tablet (50 mg total) by mouth 3 (three) times daily. 270 tablet 1  . levothyroxine (SYNTHROID) 88 MCG tablet TAKE 1 TABLET (88 MCG TOTAL) BY MOUTH DAILY. 30 tablet 2  . LINZESS 290 MCG CAPS capsule TAKE 1 CAPSULE BY MOUTH DAILY BEFORE BREAKFAST. (Patient taking differently: Take 290 mcg by mouth daily. ) 90 capsule 3  . meloxicam (MOBIC) 15 MG tablet Take 15 mg by mouth daily.    . metFORMIN (GLUCOPHAGE) 500 MG tablet TAKE 1/2 TABLET DAILY WITH BREAKFAST 15 tablet 2  . Metoprolol Tartrate 75 MG TABS TAKE 1 TABLET BY MOUTH TWICE DAILY. 60 tablet 2  . montelukast  (SINGULAIR) 10 MG tablet TAKE 1 TABLET BY MOUTH AT BEDTIME. 30 tablet 2  . Multiple Vitamin (MULTIVITAMIN WITH MINERALS) TABS tablet Take 1 tablet by mouth daily. Alive Women's Multivitamin    . Omega-3 Fatty Acids (FISH OIL) 1000 MG CAPS Take 1,000 mg by mouth every evening.    . ondansetron (ZOFRAN) 4 MG tablet Take 4 mg by mouth at bedtime.    Marland Kitchen OVER THE COUNTER MEDICATION Take 1,000 mg by mouth daily. BEET ROOT     . oxyCODONE-acetaminophen (PERCOCET) 10-325 MG tablet Take 1 tablet by mouth 2 (two) times daily.     Marland Kitchen PREMARIN vaginal cream Place 1 Applicatorful vaginally at bedtime.   3  . sodium chloride (OCEAN) 0.65 % SOLN nasal spray Place 2 sprays into both nostrils every 4 (four) hours as needed for congestion.    Marland Kitchen spironolactone (ALDACTONE) 25 MG tablet Take 1 tablet (25 mg total) by mouth daily. 30 tablet 10  . valACYclovir (VALTREX) 500 MG tablet Take 500 mg by mouth 2 (two) times daily.    . vitamin B-12 (CYANOCOBALAMIN) 1000 MCG tablet Take 1,000 mcg by mouth daily.     No current facility-administered medications for this visit.    Allergies as of 05/15/2020 - Review Complete 05/15/2020  Allergen Reaction Noted  . Celexa [citalopram hydrobromide] Other (See Comments) 05/28/2017  . Gabapentin  05/03/2018  . Norvasc [amlodipine besylate]  05/03/2018  . Diflucan [fluconazole] Rash 02/08/2018    ROS:  General: Negative for anorexia, weight loss, fever, chills, fatigue, +weakness. ENT: Negative for hoarseness, difficulty swallowing , nasal congestion. CV: Negative for chest pain, angina, palpitations, +dyspnea on exertion, peripheral edema.  Respiratory: Negative for dyspnea at rest, +dyspnea on exertion, cough, sputum, wheezing.  GI: See history of present illness. GU:  Negative for dysuria, hematuria, urinary incontinence, urinary frequency, nocturnal urination.  Endo: Negative for unusual weight change.    Physical Examination:   BP 134/65   Pulse 63   Temp 98.1 F  (36.7 C) (Temporal)   Ht 5\' 8"  (1.727 m)   Wt (!) 356 lb 9.6 oz (161.8 kg)   BMI 54.22 kg/m   General: Well-nourished, well-developed in no acute distress. Morbidly obese Eyes: No icterus. Mouth:masked Lungs: Clear to auscultation bilaterally.  Heart: Regular rate and rhythm, no murmurs rubs or gallops.  Abdomen: Bowel sounds are normal, nontender,   no rebound or guarding.  Exam limited due to body habitus Extremities: No lower extremity edema. No clubbing or deformities. Neuro: Alert and oriented x 4   Skin: Warm and dry, no jaundice.   Psych: Alert and cooperative, normal mood and affect.  Labs:  Lab Results  Component  Value Date   HGBA1C 6.7 (H) 02/15/2020   Lab Results  Component Value Date   CREATININE 0.76 12/26/2019   BUN 16 12/26/2019   NA 137 12/26/2019   K 4.3 12/26/2019   CL 101 12/26/2019   CO2 28 12/26/2019   Lab Results  Component Value Date   ALT 25 10/25/2019   AST 21 10/25/2019   ALKPHOS 88 10/25/2019   BILITOT 0.4 10/25/2019     Imaging Studies: No results found.   Impression/plan:  Pleasant 64 year old female with morbid obesity presenting for follow-up of constipation, GERD.  Historically has had difficulty managing reflux.  Complaining of heartburn and epigastric burning as outlined.  Tried and failed multiple PPIs, most recently Dexilant lost efficacy for her.  She is on Celebrex twice daily which may be contributing to her symptoms.  She does not feel that she can stop this at this time due to joint pain.  Weight is compounding her symptoms.  Strive for weight reduction.  Reinforced antireflux measures.  Switch Dexilant to Nexium 40 mg twice daily before meals.  Can also use Pepcid 20 mg twice daily as needed breakthrough symptoms.  Prescriptions provided.  Recommend discontinuing carbonated beverages.  Avoid overeating, eat multiple small meals throughout the day.  Avoid citrus fruits, greasy and fatty foods.  Call in a few weeks to let us know  how she is doing.  Constipation well managed on Linzess 290 mcg daily.

## 2020-05-16 ENCOUNTER — Other Ambulatory Visit: Payer: Self-pay | Admitting: Internal Medicine

## 2020-05-16 MED FILL — ESOMEPRAZOLE MAG DR 40 MG C: 40 | 30 days supply | Qty: 60 | Fill #0

## 2020-05-16 MED FILL — FUROSEMIDE 40 MG TAB: 40 | 90 days supply | Qty: 90 | Fill #1

## 2020-05-16 MED FILL — GABAPENTIN 300 MG CAPSULE: 300 | 30 days supply | Qty: 120 | Fill #0

## 2020-05-16 MED FILL — FAMOTIDINE 20 MG TABS: 20 | 30 days supply | Qty: 60 | Fill #0

## 2020-05-16 NOTE — Telephone Encounter (Signed)
Dr. Melvyn Novas, please advise if you are okay with [name] refilling pt's med and if we can add refills to this Rx.

## 2020-05-16 NOTE — Telephone Encounter (Signed)
Ok to refill thru next ov and we'll regroup then re long term rx need

## 2020-05-17 ENCOUNTER — Ambulatory Visit: Payer: Medicaid Other | Attending: Internal Medicine | Admitting: Internal Medicine

## 2020-05-17 ENCOUNTER — Other Ambulatory Visit: Payer: Self-pay

## 2020-05-17 DIAGNOSIS — Z8619 Personal history of other infectious and parasitic diseases: Secondary | ICD-10-CM

## 2020-05-17 DIAGNOSIS — N3946 Mixed incontinence: Secondary | ICD-10-CM | POA: Diagnosis not present

## 2020-05-17 DIAGNOSIS — E1169 Type 2 diabetes mellitus with other specified complication: Secondary | ICD-10-CM | POA: Diagnosis not present

## 2020-05-17 DIAGNOSIS — G4733 Obstructive sleep apnea (adult) (pediatric): Secondary | ICD-10-CM | POA: Diagnosis not present

## 2020-05-17 DIAGNOSIS — T7840XS Allergy, unspecified, sequela: Secondary | ICD-10-CM | POA: Diagnosis not present

## 2020-05-17 DIAGNOSIS — I10 Essential (primary) hypertension: Secondary | ICD-10-CM

## 2020-05-17 MED ORDER — METFORMIN HCL 500 MG PO TABS: 500.0000 mg | ORAL_TABLET | Freq: Every day | ORAL | 6 refills | Status: DC

## 2020-05-17 NOTE — Progress Notes (Signed)
Virtual Visit via Telephone Note Due to current restrictions/limitations of in-office visits due to the COVID-19 pandemic, this scheduled clinical appointment was converted to a telehealth visit  I connected with Robin [name] on 05/17/20 at 11:29 a.m by telephone and verified that I am speaking with the correct person using two identifiers. I am in my office.  The patient is at home.  Only the patient and myself participated in this encounter.  I discussed the limitations, risks, security and privacy concerns of performing an evaluation and management service by telephone and the availability of in person appointments. I also discussed with the patient that there may be a patient responsible charge related to this service. The patient expressed understanding and agreed to proceed.   History of Present Illness: Patient with history ofHTN, DM,OSA, fibromyalgia, depression, obesity, GERD, hypothyroid, OA of multiple js (hands, knees, shoulders, hips),pituitary adenoma status post resection.  Last seen 02/2020. Today's visit is for chronic ds management.  OSA:  Given rxn for CPAP machine on last visit and was told to carry to a medical supply store.  However, she gave it to nurse at cardiologist office as she had it done through them.  They are working on getting it for her.  States she last spoke with them about 2 wks ago and was told it can take up to 3 wks to hear from the company.  HTN:  Reports BP readings vary.  Reports compliance with meds and salt restriction. Checking BP every morning before taking meds. Readings 150/68, 158/78, 147/65.  Reading when she saw GI 2 days ago was 134/65  DM: checking BS once a day in mornings before BF.  Recent readings are 135-150s Compliant with Metformin. Reports eating habits not good.  Gaining wgh.  She continues to work with a therapist to try to decrease emotional eating.  Had shingles 6 wks ago and was seen at an urgent care for it.  Site  involved  the LT breast, axilla and upper back.  Rash is finally resolved but she Has an area on her upper back and few dark spots on LT breast that itch.   C/o incontinence of urine x 3-4 mths but now worse. Feels she can not hold her urine at all.  Also leakage when she coughs or laughs.  No dysuria but urine with strong smells.    Request referral to allergist.  Recommended by Dr. Melvyn Novas whom she saw last month. Outpatient Encounter Medications as of 05/17/2020  Medication Sig  . ACCU-CHEK AVIVA PLUS test strip USE AS DIRECTED  . acetaminophen (TYLENOL) 500 MG tablet Take 1,000 mg by mouth 2 (two) times daily.   Marland Kitchen albuterol (VENTOLIN HFA) 108 (90 Base) MCG/ACT inhaler Inhale 1-2 puffs into the lungs every 6 (six) hours as needed for wheezing or shortness of breath.  Marland Kitchen amitriptyline (ELAVIL) 50 MG tablet TAKE 1 TABLET BY MOUTH AT BEDTIME.  Marland Kitchen aspirin EC 81 MG tablet Take 81 mg by mouth at bedtime.  Marland Kitchen atorvastatin (LIPITOR) 20 MG tablet Take 0.5 tablets (10 mg total) by mouth daily.  Marland Kitchen buPROPion (WELLBUTRIN XL) 300 MG 24 hr tablet TAKE 1 TABLET (300 MG TOTAL) BY MOUTH EVERY MORNING.  . calcium carbonate (TUMS - DOSED IN MG ELEMENTAL CALCIUM) 500 MG chewable tablet Chew 2 tablets by mouth at bedtime.  . celecoxib (CELEBREX) 100 MG capsule Take 100 mg by mouth 2 (two) times daily.  . cetirizine (ZYRTEC) 10 MG tablet Take 1 tablet (10 mg total)  by mouth daily.  . chlorpheniramine (CHLOR-TRIMETON) 4 MG tablet Take 4 mg by mouth every 4 (four) hours as needed for allergies.   . Cholecalciferol 1.25 MG (50000 UT) capsule Take 50,000 Units by mouth once a week.   Marland Kitchen CINNAMON PO Take 1 capsule by mouth daily.  . diclofenac sodium (VOLTAREN) 1 % GEL Apply 2-4 g topically 4 (four) times daily as needed (PAIN.).  Marland Kitchen DULoxetine (CYMBALTA) 30 MG capsule TAKE 1 CAPSULE (30 MG TOTAL) BY MOUTH DAILY.  Marland Kitchen esomeprazole (NEXIUM) 40 MG capsule Take 1 capsule (40 mg total) by mouth 2 (two) times daily before a meal.  .  famotidine (PEPCID) 20 MG tablet Take 1 tablet (20 mg total) by mouth 2 (two) times daily as needed for heartburn or indigestion.  . fluticasone (FLONASE) 50 MCG/ACT nasal spray Place 1 spray into both nostrils daily.  . furosemide (LASIX) 40 MG tablet TAKE 1/2 TO 1 TABLET BY MOUTH DAILY FOR LOWER EXTREMITY SWELLING (Patient taking differently: Take 20-40 mg by mouth daily. )  . gabapentin (NEURONTIN) 300 MG capsule TAKE 1 CAPSULE (300 MG TOTAL) BY MOUTH 4 (FOUR) TIMES DAILY.  Marland Kitchen glucose blood (ONETOUCH ULTRA) test strip Use as instructed  . glucose blood test strip Accu-Chek Aviva Plus test strips  . glucose blood test strip   . hydrALAZINE (APRESOLINE) 50 MG tablet Take 1 tablet (50 mg total) by mouth 3 (three) times daily.  Marland Kitchen levothyroxine (SYNTHROID) 88 MCG tablet TAKE 1 TABLET (88 MCG TOTAL) BY MOUTH DAILY.  Marland Kitchen LINZESS 290 MCG CAPS capsule TAKE 1 CAPSULE BY MOUTH DAILY BEFORE BREAKFAST. (Patient taking differently: Take 290 mcg by mouth daily. )  . meloxicam (MOBIC) 15 MG tablet Take 15 mg by mouth daily.  . metFORMIN (GLUCOPHAGE) 500 MG tablet TAKE 1/2 TABLET DAILY WITH BREAKFAST  . Metoprolol Tartrate 75 MG TABS TAKE 1 TABLET BY MOUTH TWICE DAILY.  . montelukast (SINGULAIR) 10 MG tablet TAKE 1 TABLET BY MOUTH AT BEDTIME.  . Multiple Vitamin (MULTIVITAMIN WITH MINERALS) TABS tablet Take 1 tablet by mouth daily. Alive Women's Multivitamin  . Omega-3 Fatty Acids (FISH OIL) 1000 MG CAPS Take 1,000 mg by mouth every evening.  . ondansetron (ZOFRAN) 4 MG tablet Take 4 mg by mouth at bedtime.  Marland Kitchen OVER THE COUNTER MEDICATION Take 1,000 mg by mouth daily. BEET ROOT   . oxyCODONE-acetaminophen (PERCOCET) 10-325 MG tablet Take 1 tablet by mouth 2 (two) times daily.   Marland Kitchen PREMARIN vaginal cream Place 1 Applicatorful vaginally at bedtime.   . sodium chloride (OCEAN) 0.65 % SOLN nasal spray Place 2 sprays into both nostrils every 4 (four) hours as needed for congestion.  Marland Kitchen spironolactone (ALDACTONE) 25 MG  tablet Take 1 tablet (25 mg total) by mouth daily.  . valACYclovir (VALTREX) 500 MG tablet Take 500 mg by mouth 2 (two) times daily.  . vitamin B-12 (CYANOCOBALAMIN) 1000 MCG tablet Take 1,000 mcg by mouth daily.   No facility-administered encounter medications on file as of 05/17/2020.      Observations/Objective: Depression screen [facility] 2/9 03/07/2020 11/13/2019 11/03/2019  Decreased Interest 2 3 1   Down, Depressed, Hopeless 3 3 3   PHQ - 2 Score 5 6 4   Altered sleeping 1 2 3   Tired, decreased energy 3 2 3   Change in appetite 3 2 3   Feeling bad or failure about yourself  0 1 1  Trouble concentrating 3 2 1   Moving slowly or fidgety/restless 2 1 0  Suicidal thoughts 0 0 0  PHQ-9 Score 17 16 15   Some recent data might be hidden   Assessment and Plan: 1. Type 2 diabetes mellitus with morbid obesity (HCC) Fasting blood sugars not at goal.  We will increase Metformin to 500 mg daily.  Strongly encourage improvement in eating habits.  She has gone through the weight management program without much improvement so she has discontinued going.  The other step for her would be to consider weight reduction surgery - metFORMIN (GLUCOPHAGE) 500 MG tablet; Take 1 tablet (500 mg total) by mouth daily with breakfast.  Dispense: 30 tablet; Refill: 6  2. Essential hypertension Blood pressure readings not at goal but these are readings taken before she takes her medicines.  I recommend that she take blood pressure about 2 hours after she take her medicines with goal being 130/80 or lower.  3. OSA (obstructive sleep apnea) She will follow-up with the medical supply store where the cardiologist office sent her prescription and sleep study report to see when they will be coming out to deliver the CPAP and give instructions on use  4. History of shingles I will have her see my nurse practitioner in about 1 to 2 weeks just to check the spots on the breast and the upper back where she states she is still having  some itching.  We need to make sure that the shingles has completely resolved then we can give her the Shingrix vaccine.  5. Mixed stress and urge urinary incontinence Kegel's exercises discussed.  She is not interested in medication as she is already on quite a number of medications.  She is agreeable for [name] sending a prescription for her to get incontinence supplies.  6. Allergy, sequela - Ambulatory referral to Allergy   Follow Up Instructions: 3 mths with me   I discussed the assessment and treatment plan with the patient. The patient was provided an opportunity to ask questions and all were answered. The patient agreed with the plan and demonstrated an understanding of the instructions.   The patient was advised to call back or seek an in-person evaluation if the symptoms worsen or if the condition fails to improve as anticipated.  I provided 23 minutes of non-face-to-face time during this encounter.   Karle Plumber, MD

## 2020-05-17 NOTE — Progress Notes (Signed)
Pt states her blood sugar this morning was 150

## 2020-05-21 ENCOUNTER — Encounter: Payer: Self-pay | Admitting: Physical Therapy

## 2020-05-21 ENCOUNTER — Ambulatory Visit (INDEPENDENT_AMBULATORY_CARE_PROVIDER_SITE_OTHER): Payer: Medicaid Other | Admitting: *Deleted

## 2020-05-21 ENCOUNTER — Encounter: Payer: Medicaid Other | Attending: Family Medicine | Admitting: Physical Therapy

## 2020-05-21 ENCOUNTER — Other Ambulatory Visit: Payer: Self-pay | Admitting: Internal Medicine

## 2020-05-21 ENCOUNTER — Other Ambulatory Visit: Payer: Self-pay

## 2020-05-21 DIAGNOSIS — R278 Other lack of coordination: Secondary | ICD-10-CM | POA: Insufficient documentation

## 2020-05-21 DIAGNOSIS — M6281 Muscle weakness (generalized): Secondary | ICD-10-CM | POA: Diagnosis not present

## 2020-05-21 DIAGNOSIS — J309 Allergic rhinitis, unspecified: Secondary | ICD-10-CM

## 2020-05-21 DIAGNOSIS — R300 Dysuria: Secondary | ICD-10-CM | POA: Diagnosis not present

## 2020-05-21 LAB — POCT URINALYSIS DIP (DEVICE)
Bilirubin Urine: NEGATIVE
Glucose, UA: NEGATIVE mg/dL
Hgb urine dipstick: NEGATIVE
Ketones, ur: NEGATIVE mg/dL
Leukocytes,Ua: NEGATIVE
Nitrite: NEGATIVE
Protein, ur: NEGATIVE mg/dL
Specific Gravity, Urine: 1.015 (ref 1.005–1.030)
Urobilinogen, UA: 0.2 mg/dL (ref 0.0–1.0)
pH: 6 (ref 5.0–8.0)

## 2020-05-21 MED FILL — MONTELUKAST SOD 10 MG TAB: 10 | 90 days supply | Qty: 90 | Fill #0

## 2020-05-21 MED FILL — METOPROLOL TARTRATE 75 MG T: 75 | 90 days supply | Qty: 180 | Fill #0

## 2020-05-21 MED FILL — BuPROPion HCL ER (XL) 300 M: 300 | 90 days supply | Qty: 90 | Fill #0

## 2020-05-21 MED FILL — ATORVASTATIN CALCIUM 20 MG: 20 | 90 days supply | Qty: 45 | Fill #7

## 2020-05-21 NOTE — Patient Instructions (Addendum)
Moisturizers . They are used in the vagina to hydrate the mucous membrane that make up the vaginal canal. . Designed to keep a more normal acid balance (ph) . Once placed in the vagina, it will last between two to three days.  . Use 2-3 times per week at bedtime  . Ingredients to avoid is glycerin and fragrance, can increase chance of infection . Should not be used just before sex due to causing irritation . Most are gels administered either in a tampon-shaped applicator or as a vaginal suppository. They are non-hormonal.   Types of Moisturizers( 2 times per week in vaginal canal)  . Vitamin E vaginal suppositories- Whole foods, Amazon . Moist Again . Coconut oil- can break down condoms . Julva- (Do no use if on Tamoxifen) amazon . Yes moisturizer- amazon . NeuEve Silk , NeuEve Silver for menopausal or over 65 (if have severe vaginal atrophy or cancer treatments use NeuEve Silk for  1 month than move to The Pepsi)- [employer], MapleFlower.dk . Olive and Bee intimate cream- www.oliveandbee.com.au . Mae vaginal [place] . Aloe .    Creams to use externally on the Vulva area ( use daily)  Desert [employer] Lee (good for for cancer patients that had radiation to the area)- [place] or Dana[employer] Corporation.FlyingBasics.com.br  V-magic cream - amazon  Julva-amazon  Vital "V [place] salve ( help moisturize and help with thinning vulvar area, does have [employer] Hogg by Irwin Brakeman labial moisturizer ([place],   Coconut or olive oil  aloe   Things to avoid in the vaginal area . Do not use things to irritate the vulvar area . No lotions just specialized creams for the vulva area- Neogyn, V-magic, No soaps; can use Aveeno or Calendula cleanser if needed. Must be gentle . No deodorants . No douches . Good to sleep without underwear to let the vaginal area to air out . No scrubbing: spread the lips to let warm water rinse  over labias and pat dry    Lubrication . Used for intercourse to reduce friction . Avoid ones that have glycerin, warming gels, tingling gels, icing or cooling gel, scented . Avoid parabens due to a preservative similar to female sex hormone . May need to be reapplied once or several times during sexual activity . Can be applied to both partners genitals prior to vaginal penetration to minimize friction or irritation . Prevent irritation and mucosal tears that cause post coital pain and increased the risk of vaginal and urinary tract infections . Oil-based lubricants cannot be used with condoms due to breaking them down.  Least likely to irritate vaginal tissue.  . Plant based-lubes are safe . Silicone-based lubrication are thicker and last long and used for post-menopausal women  Vaginal Lubricators Here is a list of some suggested lubricators you can use for intercourse. Use the most hypoallergenic product.  You can place on you or your partner.   Slippery Stuff ( water based)  Sylk or Sliquid Natural H2O ( good  if frequent UTI's)- walmart, amazon  Sliquid organics silk-(aloe and silicone based )  [employer] (www.blossom-organics.com)- (aloe based )  Coconut oil, olive oil -not good with condoms   PJur Woman Nude- (water based) amazon  Uberlube- ( silicon) [place] has an organic one  Yes lubricant- (water based and has plant oil based similar to silicone) [employer] Platinum-Silicone, Target, Walgreens  Olive and KeySpan  intimate cream-  www.oliveandbee.com.au  [place] Things to avoid in lubricants are glycerin, warming gels, tingling gels, icing or cooling  gels, and scented gels.  Also avoid Vaseline. KY jelly, Replens, and Astroglide kills good bacteria(lactobacilli)   Irritants/Allergens ( on exposure, can cause immediate stinging or burning)  Body fluids: urine, feces, vaginal discharge,  sweat, semen  Feminine Hygiene Products: douches, yogurt douche, feminine wipes, baby wipes, sanitary pads, panty liners, tampons, deodorants (including deodorants in tampons/pads), lotions, powders (including talcum powder), perfumes, shampoos,soaps  Sexual support: lubricants, condoms, diaphragms, spermicides, arousal stimulants  Laundry detergent, bleach, fabric softener  Cleansing products: soap, bubble baths and salts, shampoo, conditioner, perfumed toielt paper  Physical irritants: tight fitting clothes, Nylon underwear, synthetic undergarments/pantyhose, chemically treated clothing, latex, wash cloths, sponges, hot water, excessive washing, vigorous drying with towel/hair dryer  HPV medication, tea tree oil, [place]  Alcohol and astringents  Topical medicaments: Benzocaine, Neomycin, Chlorhexidine (in K-Y Jelly), Imidazole antifungal, Propylene glycol ( Preservative used in many products:, tea tree oil, preservatives and bases medications are placed in.   Adapted from the V Book by Noland Fordyce. Nicole Kindred, MD., and Lynnell Dike [facility] Books, 2002), and Proceedings in Obstetrics and Gynecology, 2014; 4(2):1    Gentle Vulvar care- Adapted from the National Vulvodynia Association and the V Book  Wear loose clothing.  Wear all-cotton underwear, and go without when at home. Avoid wearing pantyhose, wear thigh high or knee high pantyhose. If you do not use underwear with yoga pants, try using an all-cotton against the vulva. Tight clothing can be irritating to the vulva by increasing friction and exacerbate irritation and redness.  Wear loose fitting clothes when possible. When swimming, remove wet bathing suits and shower to remove cholerine from the area.Marland Kitchen  Avoid hot tubs and heavily chlorinated pools. Shower after exercise to eliminate excess consider avoiding exercise that causes friction or pressure to the area such as horseback riding, bicycle riding, and spinning classes.   To  cleanse the area, use your fingers instead of a washcloth and plain water.  If you feel you must use a cleanser, unscented, non-alkaline cleanser such as Cetaphil, or mild soaps made for sensitive skin such as Dove or Neurtrogena. Be sure any cleaners are unscented. Avoid rubbing the vulva. Soak for five minutes in lukewarm water to remove any residue of sweat or lotions or other products.  Pat dry, and apply any prescribed medication. Avoid products with multiple ingredients.  Even those that sound designed for vulvar care, like A& D Original Ointment, baby lotion, or Vagisil, contain chemicals that could irritate or cause contact dermatitis. Avoid using bubble bath, feminine hygiene sprays, or any type of perfumed creams or sprays near the vulva.  In the bathroom, forgo moistened wipes. If you want moisture, use a spray bottle with plain water, and then pat dry. Use soft, white, unscented toilet paper. Avoid repetitive back and forth motion or rubbing. Drink plenty of water and fluids to dilute urine. Concentrated urine can be irritating to the vulvar skin.   When doing laundry, use hypoallergenic laundry detergent that is very mild, such as those made for babies. Avoid using fabric softeners with undergarments and double rinse undergarments.  For sexual intercourse, be sure to use a lubricant that is without additives or preservatives.  Additives like bactericides, spermicides, warming agents, and flavors can cause vulvar irritation. Rinse the vulva after intercourse. A frozen gel pack wrapped in a towel can  be used for irritation after exercise or intercourse.   Earlie Counts, PT [facility] [address], [place], [place] [address] W: [phone] [email or web address]

## 2020-05-21 NOTE — Therapy (Signed)
[place] [facility] [address], [place], [place], [address] Phone: [phone]   Fax:  [phone]  Physical Therapy Evaluation  Patient Details  Name: Robin [name] MRN: [id number] Date of Birth: 03/21/1959 Referring Provider (PT): Dr. Darron Doom   Encounter Date: 05/21/2020   PT End of Session - 05/21/20 1408    Visit Number 1    Date for PT Re-Evaluation 08/13/20    Authorization Type [place] Medicaid Healthy Blue    Authorization - Visit Number 1    Authorization - Number of Visits 27    PT Start Time 1311    PT Stop Time 1400    PT Time Calculation (min) 49 min    Activity Tolerance Patient tolerated treatment well    Behavior During Therapy [facility] for tasks assessed/performed           Past Medical History:  Diagnosis Date  . Anxiety disorder   . Arthritis   . Asthma   . Chronic neck pain   . Constipation   . Depression   . Diabetes mellitus without complication ([place])   . Dyspnea   . GERD (gastroesophageal reflux disease)   . Hiatal hernia    small  . Hyperlipidemia   . Hypertension   . Hypothyroidism   . Morbid obesity with BMI of 50.0-59.9, adult ([place])   . Schatzki's ring    non critical  . Sleep apnea    CPAP, Sleep study at [place]  . Sleep apnea     Past Surgical History:  Procedure Laterality Date  . ABDOMINAL HYSTERECTOMY    . ABDOMINAL HYSTERECTOMY  02/18/2000  . CARDIAC CATHETERIZATION  08/18/2003   normal L main/LAD/L Cfx/RCA (Dr. Adora Fridge)  . COLONOSCOPY  2006   Dr. [name], hyperplastic polyps  . COLONOSCOPY  08/06/11   abnormal terminal ileum for 10cm, erosions, geographical ulceration. Bx small bowel mucosa with prominent intramucosal lymphoid aggregates, slightly inflammed  . COLONOSCOPY WITH PROPOFOL N/A 12/28/2019   Rourk: 4 sessile serrated adenomas removed on the colon.  Next colonoscopy in 3 years.  Marland Kitchen DIRECT LARYNGOSCOPY  03/15/2012   Procedure: DIRECT  LARYNGOSCOPY;  Surgeon: [name], MD;  Location: [place];  Service: ENT;  Laterality: N/A; Dr. Wolicki--:>no foreign body seen. normal esophagus to 40cm  . ESOPHAGEAL DILATION  04/17/2015   Procedure: ESOPHAGEAL DILATION;  Surgeon: [name], MD;  [facility];  Service: Endoscopy;;  . ESOPHAGOGASTRODUODENOSCOPY  08/23/2008   WYO:VZCH distal esophageal erosions consistent with mild erosive reflux esophagitis, otherwise unremarkable esophagus/ Tiny antral erosions of doubtful clinical significance, otherwise normal stomach, patent pylorus, normal D1 and D2  . ESOPHAGOGASTRODUODENOSCOPY  08/06/11   small hh, noncritical Schatzki's ring s/p 55 F  . ESOPHAGOGASTRODUODENOSCOPY N/A 04/17/2015   YIF:OYDXAJ s/p dilation  . ESOPHAGOGASTRODUODENOSCOPY (EGD) WITH PROPOFOL N/A 01/20/2018   Dr. [name]: Erosive gastropathy, normal-appearing esophagus status post empiric dilation.  Chronic gastritis, no H. pylori.  . ESOPHAGOSCOPY  03/15/2012   Procedure: ESOPHAGOSCOPY;  Surgeon: [name], MD;  Location: [place];  Service: ENT;  Laterality: N/A;  . KNEE ARTHROSCOPY  04/27/2001  . MALONEY DILATION N/A 01/20/2018   Procedure: Venia Minks DILATION;  Surgeon: [name], MD;  [facility];  Service: Endoscopy;  Laterality: N/A;  . NM MYOCAR PERF WALL MOTION  2003   negative bruce protocol exercise tress test; EF 68%; intermediate risk study due to evidence of anterior wall ischemia  extending from mid-ventricle to apex  . 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 POLYPECTOMY  12/28/2019   Procedure: POLYPECTOMY;  Surgeon: [name], MD;  [facility];  Service: Endoscopy;;  . RECTOCELE REPAIR    . TRANSTHORACIC ECHOCARDIOGRAM  2003   EF normal  . VAGINAL PROLAPSE REPAIR    . VIDEO BRONCHOSCOPY Bilateral 03/08/2017   Procedure: VIDEO BRONCHOSCOPY WITHOUT FLUORO;  Surgeon: Javier Glazier, MD;  Location: [place];  Service: Cardiopulmonary;   Laterality: Bilateral;    There were no vitals filed for this visit.    Subjective Assessment - 05/21/20 1318    Subjective Patient reports she may have a UTI and would like to be tested. Patient reports she had urinary leakage for 1 year. Goes to the bathroom with a little bit of urine. Has to sit for 15-20 minutes to go to the bathroom again. Unable to hold her urine. Burning with urination for several weeks and strong odor. Patient is in a scooter due to the arthritis in her knees and hips. She can only stand 1-2 minutes. Patient goes to the bathroom 5-6 times per day. Patient will limit how much she drinks due to the urinary leakage. She thinks she is going to the bathroom in her sleep and try to make it to the bathroom in time.    Patient Stated Goals reduce the urinary leakage    Currently in Pain? Yes    Pain Score 5     Pain Location Pelvis    Pain Orientation Mid;Lower    Pain Descriptors / Indicators Shooting    Pain Type Acute pain    Pain Onset More than a month ago    Pain Frequency Intermittent    Aggravating Factors  when holding urine a long period of time; started several weeks ago    Pain Relieving Factors after urinating    Multiple Pain Sites No              [facility] PT Assessment - 05/21/20 0001      Assessment   Medical Diagnosis N39.3 Stress incontinence of urine    Referring Provider (PT) Dr. Darron Doom    Onset Date/Surgical Date 05/22/19    Prior Therapy none for pelvic floor      Precautions   Precautions Other (comment)    Precaution Comments wheelchair due to pain in hips and knees      Restrictions   Weight Bearing Restrictions No      Balance Screen   Has the patient fallen in the past 6 months No    Has the patient had a decrease in activity level because of a fear of falling?  No    Is the patient reluctant to leave their home because of a fear of falling?  No      Home Ecologist residence    Living  Arrangements Spouse/significant other    Available Help at Discharge Family    Type of [place] to enter    Entrance Stairs-Number of Steps 3-4    Entrance Stairs-Rails None    Home Layout One level    [place] - 2 wheels;Cane - single point;Wheelchair - manual    Additional Comments scoots around old computer chair in the house      Cognition   Overall Cognitive Status Within Functional Limits for tasks assessed  Posture/Postural Control   Posture/Postural Control Postural limitations    Posture Comments sits in W/C      ROM / Strength   AROM / PROM / Strength AROM;PROM;Strength      Strength   Right Hip Flexion 4/5    Left Hip Flexion 4/5                      Objective measurements completed on examination: See above findings.     Pelvic Floor Special Questions - 05/21/20 0001    Prior Pregnancies Yes    Number of Vaginal Deliveries 4    Any difficulty with labor and deliveries --   stitiches   Currently Sexually Active Yes    Is this Painful Yes   try lubicants; pain initially and deep   History of sexually transmitted disease --   dryness   Urinary Leakage Yes    Pad use no pads, just change clothes  4-5 times per day    Activities that cause leaking Coughing;With strong urge;Sneezing;Laughing;Other    Other activities that cause leaking gets up from bed and urine comes out    Urinary urgency Yes    Fecal incontinence No   linzess for constipation   Exam Type Deferred   due to possible UTI   Strength --   unable to stop the flow of urine so estimate 1/5           [facility] Adult PT Treatment/Exercise - 05/21/20 0001      Self-Care   Self-Care Other Self-Care Comments    Other Self-Care Comments  education on vaginal moisturizers and lubricants to assist in the vaginal dryness she is experiencing and for vaginal health                  PT Education - 05/21/20 1408    Education Details instruction on  vaginal lubricants and moisturizers for vaginal health    Person(s) Educated Patient    Methods Explanation;Handout    Comprehension Verbalized understanding            PT Short Term Goals - 05/21/20 1424      PT SHORT TERM GOAL #1   Title independent with initial HEP    Baseline --    Time 4    Period Weeks    Status New    Target Date 06/18/20      PT SHORT TERM GOAL #2   Title understand how to moisturize the vaginal canal and vulvar area to reduce dryness and burning    Baseline --    Time 4    Period Weeks    Status New    Target Date 06/18/20      PT SHORT TERM GOAL #3   Title ---    Baseline ---             PT Long Term Goals - 05/21/20 1427      PT LONG TERM GOAL #1   Title Pt will be independent with progression of final HEP    Baseline not educated yet    Time 12    Period Weeks    Status New    Target Date 08/13/20      PT LONG TERM GOAL #2   Title understand what bladder irritants area and how the affect the bladder.    Baseline not educated yet    Time 12    Period Weeks    Status New  Target Date 08/13/20      PT LONG TERM GOAL #3   Title changes her clothes 2 times per day compared to 5 times per day, due to urinating leakage improving    Baseline changes clothes 5 times per day    Time 5    Period Weeks    Status New    Target Date 08/13/20      PT LONG TERM GOAL #4   Title buring in the vaginal area decreased </= 1-2/10 due to understanding proper vaginal health and improving the moisture in the vaginal area    Baseline not educated yet    Time 12    Period Weeks    Status New    Target Date 08/13/20      PT LONG TERM GOAL #5   Title ----    Baseline ---                  Plan - 05/21/20 1409    Clinical Impression Statement Patient is a 64 year old female with urinary leakage for the past year that is getting worse in the past several months. Patient reports today she feels like she has a UTI with burning with  urinartion and foul order. Patient is having an urinalysis done today.  Patient deferred vaginal examination due to the possibility of UTI and would like to wait to next treatment session. Patient reports she has 5/10 pain in the pelvic area when holding her urine. After she urinates the pain will reolve. Patient will leak urine with strong urge, laugh, sneeze, cough, get up from bed to urinate. Patient will change her clothes 4-5 times per day due to not wearing pads. Patient reports she is not able to stop her urine flow when urinating so her strength estimates 1/5. Patient will get the urge to urinate a second time when on the commode but has to wait 15-20 minutes before she will urinated again. Patient has pain with inital and deep penetraction. Her husband says she is dry in the vaginal area. Patient uses a scooter due to the arthritis in her hips and knees. She is only able to stand 1-2 minutes. She will walk at home with holding onto things or sit on a computer chair to scoot around. Patient will benefit from skilled therapy to improve pelvic floor strength and coordination to reduce urinary leakage and education on vaginal care.    Personal Factors and Comorbidities Age;Sex;Comorbidity 3+    Comorbidities Trans abdominal hysterectomy; AP repair with TVT; Diabetes; Fibromyalgia; Osteoarthritis; Obstructive sleep apnea    Examination-Activity Limitations Continence;Locomotion Level;Transfers;Toileting;Stand;Bed Mobility    Examination-Participation Restrictions Community Activity;Interpersonal Relationship    Stability/Clinical Decision Making Evolving/Moderate complexity    Clinical Decision Making Moderate    Rehab Potential Good    PT Frequency 1x / week    PT Duration 12 weeks    PT Treatment/Interventions ADLs/Self Care Home Management;Biofeedback;Electrical Stimulation;Cryotherapy;Moist Heat;Therapeutic activities;Therapeutic exercise;Functional mobility training;Manual  techniques;Patient/family education;Neuromuscular re-education    PT Next Visit Plan timed voiding, assess the pelvic floor, see how the vaginal moisturizers are going, reducing abdominal pressure with actvitiy. squeeze ball to contract the pelvic floor    Consulted and Agree with Plan of Care Patient           Patient will benefit from skilled therapeutic intervention in order to improve the following deficits and impairments:  Decreased endurance, Decreased skin integrity, Decreased activity tolerance, Decreased strength, Pain, Decreased mobility, Decreased coordination  Visit Diagnosis:  Muscle weakness (generalized) - Plan: PT plan of care cert/re-cert  Other lack of coordination - Plan: PT plan of care cert/re-cert     Problem List Patient Active Problem List   Diagnosis Date Noted  . Polyp of descending colon 02/15/2020  . Multilevel degenerative disc disease 10/31/2018  . Abdominal pain 10/28/2018  . Change in bowel function 10/28/2018  . Gastritis and gastroduodenitis 04/26/2018  . Atrophic vaginitis 04/01/2018  . Diabetes mellitus ([place]) 03/31/2018  . Upper airway cough syndrome 02/08/2018  . Dysphagia 12/14/2017  . 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 Urinary incontinence 07/23/2017  . Early satiety 06/16/2017  . Hx of iron deficiency anemia 05/28/2017  . Throat pain in adult 04/05/2017  . Prediabetes 02/04/2017  . Dyspnea on exertion 12/23/2016  . Chronic allergic rhinitis 12/23/2016  . Hemoptysis 12/23/2016  . Fibromyalgia 08/18/2016  . Chronic lymphocytic thyroiditis 08/18/2016  . Depression 04/01/2016  . OSA (obstructive sleep apnea) 04/01/2016  . Essential hypertension 12/09/2015  . Hypothyroidism 12/09/2015  . Morbid obesity due to excess calories ([place]) 12/09/2015  . Constipation 05/27/2015  . Fatty liver 05/27/2015  . Abdominal pain, chronic, epigastric 07/04/2012  . Anxiety 06/09/2012  . History of gastroesophageal reflux (GERD) 06/09/2012  . Hyperlipidemia  06/09/2012  . Refusal of blood transfusions as patient is Jehovah's Witness 06/09/2012  . 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 Abnormal small bowel biopsy 09/18/2011  . Lactose intolerance 09/18/2011  . GERD 01/21/2010    Earlie Counts, PT 05/21/20 2:33 PM   [facility] [address] [place], [place], [address] Phone: [phone]   Fax:  [phone]  Name: CARRISA KELLER MRN: [id number] Date of Birth: 03/10/1956

## 2020-05-21 NOTE — Progress Notes (Signed)
Here for c/o burning when urinate and after she urinates the perineal area burns. UA ran and was negative - will send culture. She states she is using a wash for her perineal area. Advised to stop using wash. Informed will notify her if culture +. Advised to call us if symptoms do not improve to be seen by provider. She voices understanding. Tavis Kring,RN

## 2020-05-21 NOTE — Addendum Note (Signed)
Addended by: Samuel Germany on: 05/21/2020 05:02 PM   Modules accepted: Level of Service

## 2020-05-23 LAB — URINE CULTURE

## 2020-05-28 ENCOUNTER — Encounter: Payer: Medicaid Other | Admitting: Physical Therapy

## 2020-05-29 MED FILL — MONTELUKAST SOD 10 MG TAB: 10 | 90 days supply | Qty: 90 | Fill #0

## 2020-05-29 MED FILL — BuPROPion HCL ER (XL) 300 M: 300 | 90 days supply | Qty: 90 | Fill #0

## 2020-05-29 MED FILL — METOPROLOL TARTRATE 75 MG T: 75 | 90 days supply | Qty: 180 | Fill #0

## 2020-05-30 ENCOUNTER — Other Ambulatory Visit: Payer: Medicaid Other

## 2020-05-31 ENCOUNTER — Ambulatory Visit: Payer: Medicaid Other | Admitting: Family

## 2020-06-04 ENCOUNTER — Encounter: Payer: Self-pay | Admitting: Physical Therapy

## 2020-06-04 ENCOUNTER — Other Ambulatory Visit: Payer: Self-pay

## 2020-06-04 ENCOUNTER — Encounter: Payer: Medicaid Other | Admitting: Physical Therapy

## 2020-06-04 DIAGNOSIS — M6281 Muscle weakness (generalized): Secondary | ICD-10-CM

## 2020-06-04 DIAGNOSIS — R278 Other lack of coordination: Secondary | ICD-10-CM

## 2020-06-04 MED FILL — ATORVASTATIN CALCIUM 20 MG: 20 | 90 days supply | Qty: 45 | Fill #7

## 2020-06-04 NOTE — Therapy (Signed)
[place] [facility] [address] [place], [place], [address] Phone: [phone]   Fax:  [phone]  Physical Therapy Treatment  Patient Details  Name: Robin [name] MRN: [id number] Date of Birth: January 22, 1956 Referring Provider (PT): Dr. Darron Doom   Encounter Date: 06/04/2020   PT End of Session - 06/04/20 1508    Visit Number 2    Date for PT Re-Evaluation 08/13/20    Authorization Type [place] [employer]    Authorization - Visit Number 2    Authorization - Number of Visits 19    PT Start Time 1300    PT Stop Time 1350    PT Time Calculation (min) 50 min    Activity Tolerance Patient tolerated treatment well    Behavior During Therapy WFL for tasks assessed/performed           Past Medical History:  Diagnosis Date   Anxiety disorder    Arthritis    Asthma    Chronic neck pain    Constipation    Depression    Diabetes mellitus without complication (HCC)    Dyspnea    GERD (gastroesophageal reflux disease)    Hiatal hernia    small   Hyperlipidemia    Hypertension    Hypothyroidism    Morbid obesity with BMI of 50.0-59.9, adult ([place])    Schatzki's ring    non critical   Sleep apnea    CPAP, Sleep study at [facility]   Sleep apnea     Past Surgical History:  Procedure Laterality Date   ABDOMINAL HYSTERECTOMY     ABDOMINAL HYSTERECTOMY  02/18/2000   CARDIAC CATHETERIZATION  08/18/2003   normal L main/LAD/L Cfx/RCA (Dr. Adora Fridge)   COLONOSCOPY  2006   Dr. [name], hyperplastic polyps   COLONOSCOPY  08/06/11   abnormal terminal ileum for 10cm, erosions, geographical ulceration. Bx small bowel mucosa with prominent intramucosal lymphoid aggregates, slightly inflammed   COLONOSCOPY WITH PROPOFOL N/A 12/28/2019   Rourk: 4 sessile serrated adenomas removed on the colon.  Next colonoscopy in 3 years.   DIRECT LARYNGOSCOPY  03/15/2012   Procedure: DIRECT  LARYNGOSCOPY;  Surgeon: [name], MD;  Location: [facility];  Service: ENT;  Laterality: N/A; Dr. Wolicki--:>no foreign body seen. normal esophagus to 40cm   ESOPHAGEAL DILATION  04/17/2015   Procedure: ESOPHAGEAL DILATION;  Surgeon: [name], MD;  [facility];  Service: Endoscopy;;   ESOPHAGOGASTRODUODENOSCOPY  08/23/2008   UKG:URKY distal esophageal erosions consistent with mild erosive reflux esophagitis, otherwise unremarkable esophagus/ Tiny antral erosions of doubtful clinical significance, otherwise normal stomach, patent pylorus, normal D1 and D2   ESOPHAGOGASTRODUODENOSCOPY  08/06/11   small hh, noncritical Schatzki's ring s/p 80 F   ESOPHAGOGASTRODUODENOSCOPY N/A 04/17/2015   HCW:CBJSEG s/p dilation   ESOPHAGOGASTRODUODENOSCOPY (EGD) WITH PROPOFOL N/A 01/20/2018   Dr. [name]: Erosive gastropathy, normal-appearing esophagus status post empiric dilation.  Chronic gastritis, no H. pylori.   ESOPHAGOSCOPY  03/15/2012   Procedure: ESOPHAGOSCOPY;  Surgeon: [name], MD;  Location: [facility];  Service: ENT;  Laterality: N/A;   KNEE ARTHROSCOPY  04/27/2001   MALONEY DILATION N/A 01/20/2018   Procedure: Venia Minks DILATION;  Surgeon: [name], MD;  [facility];  Service: Endoscopy;  Laterality: N/A;   NM MYOCAR PERF WALL MOTION  2003   negative bruce protocol exercise tress test; EF 68%; intermediate risk study due to evidence of anterior wall ischemia  extending from mid-ventricle to apex   PITUITARY SURGERY  06/2012   benign tumor, surgeon at [facility]   POLYPECTOMY  12/28/2019   Procedure: POLYPECTOMY;  Surgeon: [name], MD;  [facility];  Service: Endoscopy;;   RECTOCELE REPAIR     TRANSTHORACIC ECHOCARDIOGRAM  2003   EF normal   VAGINAL PROLAPSE REPAIR     VIDEO BRONCHOSCOPY Bilateral 03/08/2017   Procedure: VIDEO BRONCHOSCOPY WITHOUT FLUORO;  Surgeon: Javier Glazier, MD;  Location: [facility];  Service: Cardiopulmonary;   Laterality: Bilateral;    There were no vitals filed for this visit.   Subjective Assessment - 06/04/20 1303    Subjective I did not have an infection. I stopped using the Feminie was and feel better. No change in leakage yet. I have tried the vaginal moisturizer and helped me be less itchy.    Patient Stated Goals reduce the urinary leakage    Currently in Pain? Yes    Pain Score 5     Pain Location Pelvis    Pain Orientation Mid;Lower    Pain Descriptors / Indicators Shooting    Pain Type Acute pain    Pain Onset More than a month ago    Pain Frequency Intermittent    Aggravating Factors  feels it when she holds her urine    Pain Relieving Factors after urination    Multiple Pain Sites No                          Pelvic Floor Special Questions - 06/04/20 0001    Urinary urgency Yes   ruinates every hour   Pelvic Floor Internal Exam Patient confirms identification and approves PT to assess pelvic floor and treatment    Exam Type Vaginal    Strength weak squeeze, no lift   minimal lift            [facility] Adult PT Treatment/Exercise - 06/04/20 0001      Self-Care   Self-Care Other Self-Care Comments    Other Self-Care Comments  reviewed with patient on vaginal moisturizres and her options, discussed vaginal health with no using washes just using water to not irritate the area; discussed with patient on what bladder irritants and and how they affect the bladder      Therapeutic Activites    Therapeutic Activities ADL's    ADL's bed mobility, getting out of wheelchair and into with not holding her breath to put pressure on the bladder; discussed as she is getting in the car to not hold her breath with the hardest parts instead to breath out      Neuro Re-ed    Neuro Re-ed Details  pelvic floor contraction with tactile cues for circular and lift, VC to fully relax the pelvic floor prior to next contraction.       Manual Therapy   Manual Therapy Internal Pelvic  Floor    Internal Pelvic Floor to bil. bulbocavernosus and urethra sphincter                  PT Education - 06/04/20 1507    Education Details Access Code: [email or web address]; reviewed vaginal moisturizers, discussed bladder irritants, when doing something strnous to breath out    Person(s) Educated Patient    Methods Explanation;Demonstration;Handout    Comprehension Returned demonstration;Verbalized understanding            PT Short Term Goals - 05/21/20 1424  PT SHORT TERM GOAL #1   Title independent with initial HEP    Baseline --    Time 4    Period Weeks    Status New    Target Date 06/18/20      PT SHORT TERM GOAL #2   Title understand how to moisturize the vaginal canal and vulvar area to reduce dryness and burning    Baseline --    Time 4    Period Weeks    Status New    Target Date 06/18/20      PT SHORT TERM GOAL #3   Title ---    Baseline ---             PT Long Term Goals - 05/21/20 1427      PT LONG TERM GOAL #1   Title Pt will be independent with progression of final HEP    Baseline not educated yet    Time 12    Period Weeks    Status New    Target Date 08/13/20      PT LONG TERM GOAL #2   Title understand what bladder irritants area and how the affect the bladder.    Baseline not educated yet    Time 12    Period Weeks    Status New    Target Date 08/13/20      PT LONG TERM GOAL #3   Title changes her clothes 2 times per day compared to 5 times per day, due to urinating leakage improving    Baseline changes clothes 5 times per day    Time 5    Period Weeks    Status New    Target Date 08/13/20      PT LONG TERM GOAL #4   Title buring in the vaginal area decreased </= 1-2/10 due to understanding proper vaginal health and improving the moisture in the vaginal area    Baseline not educated yet    Time 12    Period Weeks    Status New    Target Date 08/13/20      PT LONG TERM GOAL #5   Title ----    Baseline ---                   Plan - 06/04/20 1508    Clinical Impression Statement Patient has tried using a vaginal moisturizer and it helped. She will try coconut oil due to easier to have her husband buy it. Pelvic floor strength is 2/5 and at end was 3/5  for 1 contraction. Patient needed verbal cues to breath when conracting the pelvic floor. Patient understands what bladder irritants are. She was instructed to breath out with hard parts of transfers to reduce urinary leakage. Patient was able to get in and out of her chair to the mat independently. Patient will benefit from skilled therapy to improve pelvic floor strength and coordination to reduce urinary leakage and education on vaginal care.    Personal Factors and Comorbidities Age;Sex;Comorbidity 3+    Comorbidities Trans abdominal hysterectomy; AP repair with TVT; Diabetes; Fibromyalgia; Osteoarthritis; Obstructive sleep apnea    Examination-Activity Limitations Continence;Locomotion Level;Transfers;Toileting;Stand;Bed Mobility    Examination-Participation Restrictions Community Activity;Interpersonal Relationship    Stability/Clinical Decision Making Evolving/Moderate complexity    Rehab Potential Good    PT Frequency 1x / week    PT Duration 12 weeks    PT Treatment/Interventions ADLs/Self Care Home Management;Biofeedback;Electrical Stimulation;Cryotherapy;Moist Heat;Therapeutic activities;Therapeutic exercise;Functional mobility training;Manual techniques;Patient/family education;Neuromuscular re-education  PT Next Visit Plan work on lower abdominal contraction;press into the foam roll to engage the abdominals; breath with movement to reduce the pressure    PT Home Exercise Plan Access Code: [email or web address]    Recommended Other Services MD signed initial note    Consulted and Agree with Plan of Care Patient           Patient will benefit from skilled therapeutic intervention in order to improve the following deficits and impairments:  Decreased  endurance, Decreased skin integrity, Decreased activity tolerance, Decreased strength, Pain, Decreased mobility, Decreased coordination  Visit Diagnosis: Muscle weakness (generalized)  Other lack of coordination     Problem List Patient Active Problem List   Diagnosis Date Noted   Polyp of descending colon 02/15/2020   Multilevel degenerative disc disease 10/31/2018   Abdominal pain 10/28/2018   Change in bowel function 10/28/2018   Gastritis and gastroduodenitis 04/26/2018   Atrophic vaginitis 04/01/2018   Diabetes mellitus ([place]) 03/31/2018   Upper airway cough syndrome 02/08/2018   Dysphagia 12/14/2017   Generalized OA 07/30/2017   Urinary incontinence 07/23/2017   Early satiety 06/16/2017   Hx of iron deficiency anemia 05/28/2017   Throat pain in adult 04/05/2017   Prediabetes 02/04/2017   Dyspnea on exertion 12/23/2016   Chronic allergic rhinitis 12/23/2016   Hemoptysis 12/23/2016   Fibromyalgia 08/18/2016   Chronic lymphocytic thyroiditis 08/18/2016   Depression 04/01/2016   OSA (obstructive sleep apnea) 04/01/2016   Essential hypertension 12/09/2015   Hypothyroidism 12/09/2015   Morbid obesity due to excess calories ([place]) 12/09/2015   Constipation 05/27/2015   Fatty liver 05/27/2015   Abdominal pain, chronic, epigastric 07/04/2012   Anxiety 06/09/2012   History of gastroesophageal reflux (GERD) 06/09/2012   Hyperlipidemia 06/09/2012   Refusal of blood transfusions as patient is Jehovah's Witness 06/09/2012   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

## 2020-06-04 NOTE — Patient Instructions (Addendum)
Moisturizers . They are used in the vagina to hydrate the mucous membrane that make up the vaginal canal. . Designed to keep a more normal acid balance (ph) . Once placed in the vagina, it will last between two to three days.  . Use 2-3 times per week at bedtime  . Ingredients to avoid is glycerin and fragrance, can increase chance of infection . Should not be used just before sex due to causing irritation . Most are gels administered either in a tampon-shaped applicator or as a vaginal suppository. They are non-hormonal.   Types of Moisturizers(internal use)  . Vitamin E vaginal suppositories- Whole foods, Amazon . Moist Again . Coconut oil- can break down condoms . Julva- (Do no use if on Tamoxifen) amazon . Yes moisturizer- amazon . NeuEve Silk , NeuEve Silver for menopausal or over 65 (if have severe vaginal atrophy or cancer treatments use NeuEve Silk for  1 month than move to [employer])- [employer], MapleFlower.dk . Olive and Bee intimate cream- www.oliveandbee.com.au . Mae vaginal Eucalyptus [place] . Aloe .    Creams to use externally on the Vulva area  Albertson's (good for for cancer patients that had radiation to the area)- [place] or [employer].FlyingBasics.com.br  V-magic cream - amazon  Julva-amazon  Vital "V [place] salve ( help moisturize and help with thinning vulvar area, does have Center Line by Irwin Brakeman labial moisturizer ([place],   Coconut or olive oil  aloe   Things to avoid in the vaginal area . Do not use things to irritate the vulvar area . No lotions just specialized creams for the vulva area- Neogyn, V-magic, No soaps; can use Aveeno or Calendula cleanser if needed. Must be gentle . No deodorants . No douches . Good to sleep without underwear to let the vaginal area to air out . No scrubbing: spread the lips to let warm water rinse over labias and pat dry   Certain  foods and liquids will decrease the pH making the urine more acidic.  Urinary urgency increases when the urine has a low pH.  Most common irritants: alcohol, carbonated beverages and caffinated beverages.  Foods to avoid: apple juice, apples, ascorbic acid, canteloupes, chili, citrus fruits, coffee, cranberries, grapes, guava, peaches, pepper, pineapple, plums, strawberries, tea, tomatoes, and vinegar.  Drinking plenty of water may help to increase the pH and dilute out any of the effects of specific irritants.  Foods that are NOT irritating to the bladder include: Pears, papayas, sun-brewed teas, watermelons, non-citrus herbal teas, apricots, kava and low-acid instant drinks ([employer])  Earlie Counts, PT [facility] [address] [place], [place] [address] W: [phone] [email or web address]  Access Code: [email or web address] URL: [email or web address] Date: 06/04/2020 Prepared by: Earlie Counts  Exercises Supine Pelvic Floor Contraction - 3-4 x daily - 7 x weekly - 1 sets - 5 reps - 5 sec hold

## 2020-06-06 ENCOUNTER — Telehealth: Payer: Self-pay | Admitting: Cardiovascular Disease

## 2020-06-11 ENCOUNTER — Other Ambulatory Visit: Payer: Self-pay

## 2020-06-11 ENCOUNTER — Encounter: Payer: Medicaid Other | Attending: Family Medicine | Admitting: Physical Therapy

## 2020-06-11 ENCOUNTER — Encounter: Payer: Self-pay | Admitting: Physical Therapy

## 2020-06-11 DIAGNOSIS — M6281 Muscle weakness (generalized): Secondary | ICD-10-CM | POA: Insufficient documentation

## 2020-06-11 DIAGNOSIS — R278 Other lack of coordination: Secondary | ICD-10-CM | POA: Diagnosis not present

## 2020-06-11 MED FILL — LEVOTHYROXINE 88 MCG TABLET: 88 | 30 days supply | Qty: 30 | Fill #1

## 2020-06-11 MED FILL — CELECOXIB 100 MG CAP: 100 | 30 days supply | Qty: 60 | Fill #2

## 2020-06-11 NOTE — Therapy (Signed)
[place] at [address] [facility] [address], [place], [place], [address] Phone: [phone]   Fax:  [phone]  Physical Therapy Treatment  Patient Details  Name: Robin [name] MRN: [id number] Date of Birth: October 15, 1955 Referring Provider (PT): Dr. Darron Doom   Encounter Date: 06/11/2020   PT End of Session - 06/11/20 1307    Visit Number 3    Date for PT Re-Evaluation 08/13/20    Authorization Type [place] Medicaid [employer]    Authorization - Visit Number 3    Authorization - Number of Visits 27    PT Start Time 1302    PT Stop Time 1345    PT Time Calculation (min) 43 min    Activity Tolerance Patient tolerated treatment well    Behavior During Therapy [facility] for tasks assessed/performed           Past Medical History:  Diagnosis Date  . Anxiety disorder   . Arthritis   . Asthma   . Chronic neck pain   . Constipation   . Depression   . Diabetes mellitus without complication ([place])   . Dyspnea   . GERD (gastroesophageal reflux disease)   . Hiatal hernia    small  . Hyperlipidemia   . Hypertension   . Hypothyroidism   . Morbid obesity with BMI of 50.0-59.9, adult ([place])   . Schatzki's ring    non critical  . Sleep apnea    CPAP, Sleep study at [place]  . Sleep apnea     Past Surgical History:  Procedure Laterality Date  . ABDOMINAL HYSTERECTOMY    . ABDOMINAL HYSTERECTOMY  02/18/2000  . CARDIAC CATHETERIZATION  08/18/2003   normal L main/LAD/L Cfx/RCA (Dr. Adora Fridge)  . COLONOSCOPY  2006   Dr. [name], hyperplastic polyps  . COLONOSCOPY  08/06/11   abnormal terminal ileum for 10cm, erosions, geographical ulceration. Bx small bowel mucosa with prominent intramucosal lymphoid aggregates, slightly inflammed  . COLONOSCOPY WITH PROPOFOL N/A 12/28/2019   Rourk: 4 sessile serrated adenomas removed on the colon.  Next colonoscopy in 3 years.  Marland Kitchen DIRECT LARYNGOSCOPY  03/15/2012   Procedure: DIRECT  LARYNGOSCOPY;  Surgeon: [name], MD;  Location: [place];  Service: ENT;  Laterality: N/A; Dr. Wolicki--:>no foreign body seen. normal esophagus to 40cm  . ESOPHAGEAL DILATION  04/17/2015   Procedure: ESOPHAGEAL DILATION;  Surgeon: [name], MD;  [facility];  Service: Endoscopy;;  . ESOPHAGOGASTRODUODENOSCOPY  08/23/2008   XBM:WUXL distal esophageal erosions consistent with mild erosive reflux esophagitis, otherwise unremarkable esophagus/ Tiny antral erosions of doubtful clinical significance, otherwise normal stomach, patent pylorus, normal D1 and D2  . ESOPHAGOGASTRODUODENOSCOPY  08/06/11   small hh, noncritical Schatzki's ring s/p 46 F  . ESOPHAGOGASTRODUODENOSCOPY N/A 04/17/2015   KGM:WNUUVO s/p dilation  . ESOPHAGOGASTRODUODENOSCOPY (EGD) WITH PROPOFOL N/A 01/20/2018   Dr. [name]: Erosive gastropathy, normal-appearing esophagus status post empiric dilation.  Chronic gastritis, no H. pylori.  . ESOPHAGOSCOPY  03/15/2012   Procedure: ESOPHAGOSCOPY;  Surgeon: [name], MD;  Location: [place];  Service: ENT;  Laterality: N/A;  . KNEE ARTHROSCOPY  04/27/2001  . MALONEY DILATION N/A 01/20/2018   Procedure: Venia Minks DILATION;  Surgeon: [name], MD;  [facility];  Service: Endoscopy;  Laterality: N/A;  . NM MYOCAR PERF WALL MOTION  2003   negative bruce protocol exercise tress test; EF 68%; intermediate risk study due to evidence of anterior wall ischemia  extending from mid-ventricle to apex  . PITUITARY SURGERY  06/2012   benign tumor, surgeon at [facility]  . POLYPECTOMY  12/28/2019   Procedure: POLYPECTOMY;  Surgeon: [name], MD;  [facility];  Service: Endoscopy;;  . RECTOCELE REPAIR    . TRANSTHORACIC ECHOCARDIOGRAM  2003   EF normal  . VAGINAL PROLAPSE REPAIR    . VIDEO BRONCHOSCOPY Bilateral 03/08/2017   Procedure: VIDEO BRONCHOSCOPY WITHOUT FLUORO;  Surgeon: Javier Glazier, MD;  Location: [place];  Service: Cardiopulmonary;   Laterality: Bilateral;    There were no vitals filed for this visit.   Subjective Assessment - 06/11/20 1316    Subjective I can feel the pelvic floor tighten. When getting into the truck leaked urine. Leak urine when move.Pelvic pain is 60% better. I am using the cocnut oil.    Patient Stated Goals reduce the urinary leakage    Currently in Pain? Yes    Pain Score 6     Pain Location Pelvis    Pain Orientation Mid;Lower    Pain Descriptors / Indicators Shooting    Pain Type Acute pain    Pain Onset More than a month ago    Pain Frequency Intermittent    Aggravating Factors  when she is holding her urine.    Pain Relieving Factors after urination                             [facility] Adult PT Treatment/Exercise - 06/11/20 0001      Therapeutic Activites    Therapeutic Activities ADL's    ADL's instructing patient to breath as she transfer from her wheelchair to the mat and back to reduce urinary leakage, sit to stand with correct breatihing      Neuro Re-ed    Neuro Re-ed Details  used the addaday to vibrate the inner thigh to assist in pelvic floor contraction      Lumbar Exercises: Stretches   Other Lumbar Stretch Exercise butterfly stretch holding for 30 seconds; hip adductor stretch in supine with legs apart      Lumbar Exercises: Supine   Ab Set 10 reps    AB Set Limitations contracting the lower abdominals and did well, patient had soreness in lower abdominals with lower abdominall contraction     Other Supine Lumbar Exercises gluteal sets to stretch her legs and improve contraction of gluteals due to sitting alot in a chair and when transfers she is flexed at her hips      Manual Therapy   Manual Therapy Soft tissue mobilization    Manual therapy comments educated patient on how to massage the lower abdomen to release the tissue    Soft tissue mobilization using the addaday to the quadricep  and hip adductore and lower abdominals to improve tissue  mobility and reduce tightness,                   PT Education - 06/11/20 1354    Education Details Access Code: [email or web address]    Person(s) Educated Patient    Methods Explanation;Demonstration    Comprehension Returned demonstration;Verbalized understanding            PT Short Term Goals - 06/11/20 1613      PT SHORT TERM GOAL #1   Title independent with initial HEP    Time 4    Period Weeks    Status On-going  PT SHORT TERM GOAL #2   Title understand how to moisturize the vaginal canal and vulvar area to reduce dryness and burning    Time 4    Period Weeks    Status Achieved             PT Long Term Goals - 05/21/20 1427      PT LONG TERM GOAL #1   Title Pt will be independent with progression of final HEP    Baseline not educated yet    Time 51    Period Weeks    Status New    Target Date 08/13/20      PT LONG TERM GOAL #2   Title understand what bladder irritants area and how the affect the bladder.    Baseline not educated yet    Time 12    Period Weeks    Status New    Target Date 08/13/20      PT LONG TERM GOAL #3   Title changes her clothes 2 times per day compared to 5 times per day, due to urinating leakage improving    Baseline changes clothes 5 times per day    Time 5    Period Weeks    Status New    Target Date 08/13/20      PT LONG TERM GOAL #4   Title buring in the vaginal area decreased </= 1-2/10 due to understanding proper vaginal health and improving the moisture in the vaginal area    Baseline not educated yet    Time 12    Period Weeks    Status New    Target Date 08/13/20      PT LONG TERM GOAL #5   Title ----    Baseline ---                 Plan - 06/11/20 1608    Clinical Impression Statement Patient is using the cocnut oil on the vaginal area for dryness. Patient is able to feel the pelvic floor contraction. Patient has very tight hips with reduction of hip adduction and extension. When patient transfers  she is flexed making it difficult to contract her gluteals and sits in a chair wtih her hips flexed. Patient is learning to breath with transfers to reduce strain on the pelvic floor and reduce leakage. Patient has not met goals yet but is working hard to accomplish them. Patient will benefit from skilled therapy to improve pelvic floor strength and coordination to reduce urinary leakage and education on vaginal care.    Personal Factors and Comorbidities Age;Sex;Comorbidity 3+    Comorbidities Trans abdominal hysterectomy; AP repair with TVT; Diabetes; Fibromyalgia; Osteoarthritis; Obstructive sleep apnea    Examination-Activity Limitations Continence;Locomotion Level;Transfers;Toileting;Stand;Bed Mobility    Examination-Participation Restrictions Community Activity;Interpersonal Relationship    Stability/Clinical Decision Making Evolving/Moderate complexity    Rehab Potential Good    PT Frequency 1x / week    PT Duration 12 weeks    PT Treatment/Interventions ADLs/Self Care Home Management;Biofeedback;Electrical Stimulation;Cryotherapy;Moist Heat;Therapeutic activities;Therapeutic exercise;Functional mobility training;Manual techniques;Patient/family education;Neuromuscular re-education    PT Next Visit Plan add bridge, sitting with pressing into the arm rest of cahir to engage the abdominals, stretch the hip adductors and flexors, pelvic floor contraction, sitting trunk diagonals with ball squeeze    PT Home Exercise Plan Access Code: [email or web address]    Consulted and Agree with Plan of Care Patient           Patient will benefit from skilled therapeutic  intervention in order to improve the following deficits and impairments:  Decreased endurance, Decreased skin integrity, Decreased activity tolerance, Decreased strength, Pain, Decreased mobility, Decreased coordination  Visit Diagnosis: Muscle weakness (generalized)  Other lack of coordination     Problem List Patient Active Problem List    Diagnosis Date Noted  . Polyp of descending colon 02/15/2020  . Multilevel degenerative disc disease 10/31/2018  . Abdominal pain 10/28/2018  . Change in bowel function 10/28/2018  . Gastritis and gastroduodenitis 04/26/2018  . Atrophic vaginitis 04/01/2018  . Diabetes mellitus ([place]) 03/31/2018  . Upper airway cough syndrome 02/08/2018  . Dysphagia 12/14/2017  . 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 Urinary incontinence 07/23/2017  . Early satiety 06/16/2017  . Hx of iron deficiency anemia 05/28/2017  . Throat pain in adult 04/05/2017  . Prediabetes 02/04/2017  . Dyspnea on exertion 12/23/2016  . Chronic allergic rhinitis 12/23/2016  . Hemoptysis 12/23/2016  . Fibromyalgia 08/18/2016  . Chronic lymphocytic thyroiditis 08/18/2016  . Depression 04/01/2016  . OSA (obstructive sleep apnea) 04/01/2016  . Essential hypertension 12/09/2015  . Hypothyroidism 12/09/2015  . Morbid obesity due to excess calories ([place]) 12/09/2015  . Constipation 05/27/2015  . Fatty liver 05/27/2015  . Abdominal pain, chronic, epigastric 07/04/2012  . Anxiety 06/09/2012  . History of gastroesophageal reflux (GERD) 06/09/2012  . Hyperlipidemia 06/09/2012  . Refusal of blood transfusions as patient is Jehovah's Witness 06/09/2012  . 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 Abnormal small bowel biopsy 09/18/2011  . Lactose intolerance 09/18/2011  . GERD 01/21/2010    Earlie Counts, PT 06/11/20 4:14 PM   [facility] [address], [place], [place], [address] Phone: [phone]   Fax:  [phone]  Name: Robin [name] MRN: [id number] Date of Birth: 1956/01/18

## 2020-06-11 NOTE — Patient Instructions (Signed)
Access Code: [email or web address] URL: [email or web address] Date: 06/11/2020 Prepared by: Earlie Counts  Program Notes circular massage on the bottom of your stomach 1 time daily for 3 minutes    Exercises Supine Pelvic Floor Contraction - 3-4 x daily - 7 x weekly - 1 sets - 5 reps - 5 sec hold Supine Gluteal Sets - 2 x daily - 7 x weekly - 1 sets - 5 reps - 5 sec hold Supine Hip Adductor Stretch - 2 x daily - 7 x weekly - 1 sets - 1 reps - 1 min hold Supine Transversus Abdominis Bracing - Hands on Stomach - 2 x daily - 7 x weekly - 1 sets - 5 reps - 5 sec hold Earlie Counts, PT [facility] [address], [place], [place] [address] W: [phone] [email or web address]

## 2020-06-17 ENCOUNTER — Other Ambulatory Visit: Payer: Self-pay

## 2020-06-17 ENCOUNTER — Ambulatory Visit (INDEPENDENT_AMBULATORY_CARE_PROVIDER_SITE_OTHER): Payer: Medicaid Other | Admitting: Allergy and Immunology

## 2020-06-17 ENCOUNTER — Other Ambulatory Visit: Payer: Self-pay | Admitting: Internal Medicine

## 2020-06-17 ENCOUNTER — Other Ambulatory Visit: Payer: Self-pay | Admitting: Allergy and Immunology

## 2020-06-17 ENCOUNTER — Encounter: Payer: Self-pay | Admitting: Allergy and Immunology

## 2020-06-17 VITALS — BP 136/76 | HR 60 | Temp 98.6°F | Resp 16 | Ht 68.0 in | Wt 350.0 lb

## 2020-06-17 DIAGNOSIS — R0609 Other forms of dyspnea: Secondary | ICD-10-CM

## 2020-06-17 DIAGNOSIS — R05 Cough: Secondary | ICD-10-CM | POA: Diagnosis not present

## 2020-06-17 DIAGNOSIS — J3089 Other allergic rhinitis: Secondary | ICD-10-CM | POA: Diagnosis not present

## 2020-06-17 DIAGNOSIS — R0600 Dyspnea, unspecified: Secondary | ICD-10-CM

## 2020-06-17 DIAGNOSIS — I10 Essential (primary) hypertension: Secondary | ICD-10-CM

## 2020-06-17 DIAGNOSIS — R053 Chronic cough: Secondary | ICD-10-CM

## 2020-06-17 MED ORDER — FLOVENT HFA 220 MCG/ACT IN AERO: INHALATION_SPRAY | RESPIRATORY_TRACT | 12 refills | Status: DC

## 2020-06-17 MED ORDER — AZELASTINE HCL 0.1 % NA SOLN: NASAL | 12 refills | Status: AC

## 2020-06-17 MED FILL — FLOVENT HFA 220 MCG INHALER: 220 | 30 days supply | Qty: 12 | Fill #0

## 2020-06-17 MED FILL — SPIRONOLACTONE 25 MG TABLET: 25 | 90 days supply | Qty: 90 | Fill #3

## 2020-06-17 MED FILL — AZELASTINE HCL 137 MCG SPRY: 0.1 | 30 days supply | Qty: 30 | Fill #0

## 2020-06-17 MED FILL — AMITRIPTYLINE HCL 50 MG TAB: 50 | 90 days supply | Qty: 90 | Fill #0

## 2020-06-17 MED FILL — FAMOTIDINE 20 MG TABS: 20 | 30 days supply | Qty: 60 | Fill #1

## 2020-06-17 MED FILL — GABAPENTIN 300 MG CAPSULE: 300 | 30 days supply | Qty: 120 | Fill #1

## 2020-06-17 NOTE — Assessment & Plan Note (Signed)
Spirometry today reveals a moderate restrictive pattern without significant postbronchodilator response.  This test was done while the patient was asymptomatic.  The restrictive pattern may be due to body habitus.  Given her history of wheezing and chest tightness, we will proceed to a therapeutic trial of ICS.  A prescription has been provided for Flovent 220 g, 2 inhalations twice daily.  To maximize pulmonary deposition, a spacer has been provided along with instructions for its proper administration with an HFA inhaler.  Continue montelukast 10 mg daily at bedtime.  Continue albuterol HFA, 1 to 2 inhalations every 4-6 hours if needed  Full pulmonary function testing would be beneficial.

## 2020-06-17 NOTE — Progress Notes (Signed)
New Patient Note  RE: Robin [name] MRN: [id number] DOB: 21-Dec-1955 Date of Office Visit: 06/17/2020  Referring provider: Ladell Pier, MD Primary care provider: Ladell Pier, MD  Chief Complaint: Allergic Rhinitis  and Asthma   History of present illness: Robin [name] is a 64 y.o. female seen today in consultation requested by Robin Plumber, MD.  She is followed by Robin [name], for asthma and persistent cough, as well as an otolaryngologist for persistent cough.  She reports that she experiences wheezing and chest tightness from "the littlest thing."  In addition, she experiences nocturnal awakenings almost every night because she "cannot breathe" and uses albuterol with relief.  She reports that even minor exertion causes significant shortness of breath.  She is a former cigarette smoker Stuffy pnd, watery eyes, sinus pressure over the cheekbones. Yr round, but progressively worse this year.  She reports that she smoked cigarettes for 20 years averaging half pack per day and quit 24 years ago. Robin [name] experiences nasal congestion, thick postnasal drainage, and sinus pressure over the cheekbones.  No significant seasonal symptom variation has been noted nor have specific environmental triggers been identified.  She attempts to control the symptoms with over-the-counter cetirizine or loratadine as needed and/or fluticasone nasal spray without adequate symptom relief. Robin [name] experiences occasional heartburn despite compliance with esomeprazole and famotidine.  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  Intradermal environmental tests revealed slight reactivity to dust mite antigen, all others were negative.  Aeroallergen avoidance measures have been discussed and provided in written form.  A prescription has been provided for  azelastine nasal spray, 1-2 sprays per nostril 2 times daily as needed. Proper nasal spray technique has been discussed and demonstrated.   Nasal saline lavage (Robin [name]) has been recommended as needed and prior to medicated nasal sprays along with instructions for proper administration.  For thick post nasal drainage, add guaifenesin [phone] mg (Robin [name])  twice daily as needed with adequate hydration as discussed.  Dyspnea on exertion Spirometry today reveals a moderate restrictive pattern without significant postbronchodilator response.  This test was done while the patient was asymptomatic.  The restrictive pattern may be due to body habitus.  Given her history of wheezing and chest tightness, we will proceed to a therapeutic trial of ICS.  A prescription has been provided for Robin [name] 220 g, 2 inhalations twice daily.  To maximize pulmonary deposition, a spacer has been provided along with instructions for its proper administration with an HFA inhaler.  Continue montelukast 10 mg daily at bedtime.  Continue albuterol HFA, 1 to 2 inhalations every 4-6 hours if needed  Full pulmonary function testing would be beneficial.  Cough, persistent Most likely multifactorial.  Treatment plan as outlined above.  Continue gabapentin as prescribed.  Continue appropriate reflux lifestyle modifications, Robin [name], and Robin [name] as prescribed.   Meds ordered this encounter  Medications  . azelastine (ASTELIN) 0.1 % nasal spray    Sig: 1-2 sprays per nostril 2 times daily as needed    Dispense:  30 mL    Refill:  12  . fluticasone (Robin [name] HFA) 220 MCG/ACT inhaler    Sig: 2 inhalations twice daily    Dispense:  1 each    Refill:  12    Diagnostics: Spirometry: FVC was 1.62 L (52% predicted) and FEV1 was 1.36 L (56% predicted) with 70 mL (5%) postbronchodilator improvement.  This study  was performed while the patient was asymptomatic.  Please see scanned spirometry results for  details. Epicutaneous environmental testing: Negative despite a positive histamine control. Intradermal environmental testing: Negative.   Physical examination: Blood pressure 136/76, pulse 60, temperature 98.6 F (37 C), temperature source Oral, resp. rate 16, height 5\' 8"  (1.727 m), weight (!) 350 lb (158.8 kg), SpO2 97 %.  General: Alert, interactive, in no acute distress. HEENT: TMs pearly gray, turbinates moderately edematous with clear discharge, post-pharynx moderately erythematous. Neck: Supple without lymphadenopathy. Lungs: Mildly decreased breath sounds bilaterally without wheezing, rhonchi or rales. CV: Normal S1, S2 without murmurs. Abdomen: Nondistended, nontender. Skin: Warm and dry, without lesions or rashes. Extremities:  No clubbing, cyanosis or edema. Neuro:   Grossly intact.  Review of systems:  Review of systems negative except as noted in HPI / PMHx or noted below: Review of Systems  Constitutional: Negative.   HENT: Negative.   Eyes: Negative.   Respiratory: Negative.   Cardiovascular: Negative.   Gastrointestinal: Negative.   Genitourinary: Negative.   Musculoskeletal: Negative.   Skin: Negative.   Neurological: Negative.   Endo/Heme/Allergies: Negative.   Psychiatric/Behavioral: Negative.     Past medical history:  Past Medical History:  Diagnosis Date  . Anxiety disorder   . Arthritis   . Asthma   . Chronic neck pain   . Constipation   . Depression   . Diabetes mellitus without complication ([place])   . Dyspnea   . GERD (gastroesophageal reflux disease)   . Hiatal hernia    small  . Hyperlipidemia   . Hypertension   . Hypothyroidism   . Morbid obesity with BMI of 50.0-59.9, adult ([place])   . Schatzki's ring    non critical  . Sinusitis   . Sleep apnea    CPAP, Sleep study at [place]  . Sleep apnea     Past surgical history:  Past Surgical History:  Procedure Laterality Date  . ABDOMINAL HYSTERECTOMY    . ABDOMINAL  HYSTERECTOMY  02/18/2000  . CARDIAC CATHETERIZATION  08/18/2003   normal L main/LAD/L Cfx/RCA (Dr. Adora Fridge)  . COLONOSCOPY  2006   Dr. [name], hyperplastic polyps  . COLONOSCOPY  08/06/11   abnormal terminal ileum for 10cm, erosions, geographical ulceration. Bx small bowel mucosa with prominent intramucosal lymphoid aggregates, slightly inflammed  . COLONOSCOPY WITH PROPOFOL N/A 12/28/2019   Rourk: 4 sessile serrated adenomas removed on the colon.  Next colonoscopy in 3 years.  Marland Kitchen DIRECT LARYNGOSCOPY  03/15/2012   Procedure: DIRECT LARYNGOSCOPY;  Surgeon: [name], MD;  Location: [facility];  Service: ENT;  Laterality: N/A; Dr. Wolicki--:>no foreign body seen. normal esophagus to 40cm  . ESOPHAGEAL DILATION  04/17/2015   Procedure: ESOPHAGEAL DILATION;  Surgeon: [name], MD;  [facility];  Service: Endoscopy;;  . ESOPHAGOGASTRODUODENOSCOPY  08/23/2008   ZOX:WRUE distal esophageal erosions consistent with mild erosive reflux esophagitis, otherwise unremarkable esophagus/ Tiny antral erosions of doubtful clinical significance, otherwise normal stomach, patent pylorus, normal D1 and D2  . ESOPHAGOGASTRODUODENOSCOPY  08/06/11   small hh, noncritical Schatzki's ring s/p 85 F  . ESOPHAGOGASTRODUODENOSCOPY N/A 04/17/2015   AVW:[id number] s/p dilation  . ESOPHAGOGASTRODUODENOSCOPY (EGD) WITH PROPOFOL N/A 01/20/2018   Dr. [name]: Erosive gastropathy, normal-appearing esophagus status post empiric dilation.  Chronic gastritis, no H. pylori.  . ESOPHAGOSCOPY  03/15/2012   Procedure: ESOPHAGOSCOPY;  Surgeon: [name], MD;  Location: [facility];  Service: ENT;  Laterality: N/A;  . KNEE  ARTHROSCOPY  04/27/2001  . MALONEY DILATION N/A 01/20/2018   Procedure: Venia Minks DILATION;  Surgeon: [name], MD;  [facility];  Service: Endoscopy;  Laterality: N/A;  . [place]  2003   negative bruce protocol exercise tress test; EF 68%; intermediate risk study due to  evidence of anterior wall ischemia extending from mid-ventricle to apex  . 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 POLYPECTOMY  12/28/2019   Procedure: POLYPECTOMY;  Surgeon: [name], MD;  [facility];  Service: Endoscopy;;  . RECTOCELE REPAIR    . TRANSTHORACIC ECHOCARDIOGRAM  2003   EF normal  . VAGINAL PROLAPSE REPAIR    . VIDEO BRONCHOSCOPY Bilateral 03/08/2017   Procedure: VIDEO BRONCHOSCOPY WITHOUT FLUORO;  Surgeon: Javier Glazier, MD;  Location: [facility];  Service: Cardiopulmonary;  Laterality: Bilateral;    Family history: Family History  Problem Relation Age of Onset  . Kidney cancer Father   . Hypertension Father   . Hyperlipidemia Father   . Sleep apnea Father   . Diabetes Mother   . Hypertension Mother   . Heart Problems Mother   . Hyperlipidemia Mother   . Asthma Mother   . Sleep apnea Mother   . Liver disease Brother   . Arthritis Brother   . Hypertension Sister   . Allergic rhinitis Sister   . Stomach cancer Other        aunt  . Breast cancer Maternal Grandmother   . Kidney disease Maternal Grandfather   . Breast cancer Paternal Grandmother   . Hypertension Brother   . Hyperlipidemia Brother   . Hypertension Brother   . Hypertension Sister   . Hyperlipidemia Sister   . Lupus Maternal Aunt   . Eczema Daughter   . Colon cancer Neg Hx   . Immunodeficiency Neg Hx   . Urticaria Neg Hx     Social history: Social History   Socioeconomic History  . Marital status: Married    Spouse name: Not on file  . Number of children: 4  . Years of education: 10  . Highest education level: Not on file  Occupational History    Employer: UNEMPLOYED  Tobacco Use  . Smoking status: Former Smoker    Packs/day: 0.50    Years: 10.00    Pack years: 5.00    Types: Cigarettes    Start date: 07/14/1975    Quit date: 04/04/1993    Years since quitting: 27.2  . Smokeless tobacco: Never Used  . Tobacco comment: 17 yrs ago    Vaping Use  . Vaping Use: Never used  Substance and Sexual Activity  . Alcohol use: No    Alcohol/week: 0.0 standard drinks  . Drug use: No  . Sexual activity: Yes  Other Topics Concern  . Not on file  Social History Narrative   Lives with husband in an apartment on the first floor.  Has 4 children.     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  Trying to get disability.  Formerly worked as a Teacher, early years/pre.  Education: 11th grade.      [facility] (12/23/16):   Originally from [place]. She was raised in [place]. 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 No pets  currently. No bird exposure. She does have mold in her current home in the bathroom, laundry room, & master bedroom.    Social Determinants of Health   Financial Resource Strain:   . Difficulty of Paying Living Expenses: Not on file  Food Insecurity:   . Worried About Charity fundraiser in the Last Year: Not on file  . Ran Out of Food in the Last Year: Not on file  Transportation Needs: No Transportation Needs  . Lack of Transportation (Medical): No  . Lack of Transportation (Non-Medical): No  Physical Activity:   . Days of Exercise per Week: Not on file  . Minutes of Exercise per Session: Not on file  Stress:   . Feeling of Stress : Not on file  Social Connections:   . Frequency of Communication with Friends and Family: Not on file  . Frequency of Social Gatherings with Friends and Family: Not on file  . Attends Religious Services: Not on file  . Active Member of Clubs or Organizations: Not on file  . Attends Archivist Meetings: Not on file  . Marital Status: Not on file  Intimate Partner Violence:   . Fear of Current or Ex-Partner: Not on file  . Emotionally Abused: Not on file  . Physically Abused: Not on file  . Sexually Abused: Not on file     Environmental History: The patient lives in a house with central air/heat.  There is no known mold/water damage in the home.  There are no pets in the home.  She is a former smoker, started at 64 yo, quit 24 years ago, 1/2 ppd on average.   Current Outpatient Medications  Medication Sig Dispense Refill  . ACCU-CHEK AVIVA PLUS test strip USE AS DIRECTED 100 each 12  . acetaminophen (TYLENOL) 500 MG tablet Take 1,000 mg by mouth 2 (two) times daily.     Marland Kitchen albuterol (VENTOLIN HFA) 108 (90 Base) MCG/ACT inhaler Inhale 1-2 puffs into the lungs every 6 (six) hours as needed for wheezing or shortness of breath. 18 g 0  . amitriptyline (ELAVIL) 50 MG tablet TAKE 1 TABLET BY MOUTH AT BEDTIME. 30 tablet 2  . aspirin EC 81 MG tablet Take 81 mg by mouth at bedtime.    Marland Kitchen atorvastatin (LIPITOR) 20 MG tablet Take 0.5 tablets (10 mg total) by mouth daily. 90 tablet 3  . buPROPion (WELLBUTRIN XL) 300 MG 24 hr tablet TAKE 1 TABLET (300 MG TOTAL) BY MOUTH EVERY MORNING. 90 tablet 2  . calcium carbonate (TUMS - DOSED IN MG ELEMENTAL CALCIUM) 500 MG chewable tablet Chew 2 tablets by mouth at bedtime.    . celecoxib (CELEBREX) 100 MG capsule Take 100 mg by mouth 2 (two) times daily.    . cetirizine (ZYRTEC) 10 MG tablet Take 1 tablet (10 mg total) by mouth daily. 30 tablet 1  . chlorpheniramine (CHLOR-TRIMETON) 4 MG tablet Take 4 mg by mouth every 4 (four) hours as needed for allergies.     . Cholecalciferol 1.25 MG (50000 UT) capsule Take 50,000 Units by mouth once a week.     Marland Kitchen CINNAMON PO Take 1 capsule by mouth daily.    . diclofenac sodium (VOLTAREN) 1 % GEL Apply 2-4 g topically 4 (four) times daily as needed (PAIN.).    Marland Kitchen DULoxetine (CYMBALTA) 30 MG capsule TAKE 1 CAPSULE (30 MG TOTAL) BY MOUTH DAILY. 30 capsule 6  . esomeprazole (Robin [name]) 40 MG capsule Take 1 capsule (40 mg total) by  mouth 2 (two) times daily before a meal. 60 capsule 5  . fluticasone (FLONASE) 50 MCG/ACT nasal spray Place 1 spray into  both nostrils daily. 16 g 1  . furosemide (LASIX) 40 MG tablet TAKE 1/2 TO 1 TABLET BY MOUTH DAILY FOR LOWER EXTREMITY SWELLING (Patient taking differently: Take 20-40 mg by mouth daily. ) 90 tablet 2  . gabapentin (NEURONTIN) 300 MG capsule TAKE 1 CAPSULE (300 MG TOTAL) BY MOUTH 4 (FOUR) TIMES DAILY. 120 capsule 2  . glucose blood (ONETOUCH ULTRA) test strip Use as instructed 100 each 12  . hydrALAZINE (APRESOLINE) 50 MG tablet Take 1 tablet (50 mg total) by mouth 3 (three) times daily. 270 tablet 1  . levothyroxine (SYNTHROID) 88 MCG tablet TAKE 1 TABLET (88 MCG TOTAL) BY MOUTH DAILY. 30 tablet 2  . LINZESS 290 MCG CAPS capsule TAKE 1 CAPSULE BY MOUTH DAILY BEFORE BREAKFAST. (Patient taking differently: Take 290 mcg by mouth daily. ) 90 capsule 3  . meloxicam (MOBIC) 15 MG tablet Take 15 mg by mouth daily.    . metFORMIN (GLUCOPHAGE) 500 MG tablet Take 1 tablet (500 mg total) by mouth daily with breakfast. 30 tablet 6  . Metoprolol Tartrate 75 MG TABS TAKE 1 TABLET BY MOUTH TWICE DAILY. 180 tablet 2  . montelukast (SINGULAIR) 10 MG tablet TAKE 1 TABLET BY MOUTH AT BEDTIME. 90 tablet 2  . Multiple Vitamin (MULTIVITAMIN WITH MINERALS) TABS tablet Take 1 tablet by mouth daily. Alive Women's Multivitamin    . Omega-3 Fatty Acids (FISH OIL) 1000 MG CAPS Take 1,000 mg by mouth every evening.    . ondansetron (ZOFRAN) 4 MG tablet Take 4 mg by mouth at bedtime.    Marland Kitchen OVER THE COUNTER MEDICATION Take 1,000 mg by mouth daily. BEET ROOT     . oxyCODONE-acetaminophen (PERCOCET) 10-325 MG tablet Take 1 tablet by mouth 2 (two) times daily.     Marland Kitchen PREMARIN vaginal cream Place 1 Applicatorful vaginally at bedtime.   3  . sodium chloride (OCEAN) 0.65 % SOLN nasal spray Place 2 sprays into both nostrils every 4 (four) hours as needed for congestion.    Marland Kitchen spironolactone (ALDACTONE) 25 MG tablet Take 1 tablet (25 mg total) by mouth daily. 30 tablet 10  . valACYclovir (VALTREX) 500 MG tablet Take 500 mg by mouth 2  (two) times daily.    . vitamin B-12 (CYANOCOBALAMIN) 1000 MCG tablet Take 1,000 mcg by mouth daily.    Marland Kitchen azelastine (ASTELIN) 0.1 % nasal spray 1-2 sprays per nostril 2 times daily as needed 30 mL 12  . famotidine (Robin [name]) 20 MG tablet Take 1 tablet (20 mg total) by mouth 2 (two) times daily as needed for heartburn or indigestion. (Patient not taking: Reported on 06/17/2020) 60 tablet 3  . fluticasone (Robin [name] HFA) 220 MCG/ACT inhaler 2 inhalations twice daily 1 each 12   No current facility-administered medications for this visit.    Known medication allergies: Allergies  Allergen Reactions  . Celexa [Citalopram Hydrobromide] Other (See Comments)    "Made me feel out of it"  . Gabapentin     Contributed to lower extremity edema  . Norvasc [Amlodipine Besylate]     Edema in lower extremities  . Diflucan [Fluconazole] Rash    I appreciate the opportunity to take part in Mithra's care. Please do not hesitate to contact me with questions.  Sincerely,   R. Edgar Frisk, MD

## 2020-06-17 NOTE — Assessment & Plan Note (Signed)
Most likely multifactorial.  Treatment plan as outlined above.  Continue gabapentin as prescribed.  Continue appropriate reflux lifestyle modifications, Nexium, and Pepcid as prescribed.

## 2020-06-17 NOTE — Assessment & Plan Note (Signed)
Epicutaneous environmental tests were negative today despite a positive histamine control.  Intradermal environmental tests revealed slight reactivity to dust mite antigen, all others were negative.  Aeroallergen avoidance measures have been discussed and provided in written form.  A prescription has been provided for azelastine nasal spray, 1-2 sprays per nostril 2 times daily as needed. Proper nasal spray technique has been discussed and demonstrated.   Nasal saline lavage (NeilMed) has been recommended as needed and prior to medicated nasal sprays along with instructions for proper administration.  For thick post nasal drainage, add guaifenesin [phone] mg (Mucinex)  twice daily as needed with adequate hydration as discussed.

## 2020-06-17 NOTE — Patient Instructions (Addendum)
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  Intradermal environmental tests revealed slight reactivity to dust mite antigen, all others were negative.  Aeroallergen avoidance measures have been discussed and provided in written form.  A prescription has been provided for azelastine nasal spray, 1-2 sprays per nostril 2 times daily as needed. Proper nasal spray technique has been discussed and demonstrated.   Nasal saline lavage (NeilMed) has been recommended as needed and prior to medicated nasal sprays along with instructions for proper administration.  For thick post nasal drainage, add guaifenesin [phone] mg (Mucinex)  twice daily as needed with adequate hydration as discussed.  Dyspnea on exertion Spirometry today reveals a moderate restrictive pattern without significant postbronchodilator response.  This test was done while the patient was asymptomatic.  The restrictive pattern may be due to body habitus.  Given her history of wheezing and chest tightness, we will proceed to a therapeutic trial of ICS.  A prescription has been provided for Flovent 220 g, 2 inhalations twice daily.  To maximize pulmonary deposition, a spacer has been provided along with instructions for its proper administration with an HFA inhaler.  Continue montelukast 10 mg daily at bedtime.  Continue albuterol HFA, 1 to 2 inhalations every 4-6 hours if needed  Full pulmonary function testing would be beneficial.  Cough, persistent Most likely multifactorial.  Treatment plan as outlined above.  Continue gabapentin as prescribed.  Continue appropriate reflux lifestyle modifications, Nexium, and Pepcid as prescribed.   Return in about 2 months (around 08/17/2020), or if symptoms worsen or fail to improve.  Control of House Dust Mite Allergen  House dust mites play a major  role in allergic asthma and rhinitis.  They occur in environments with high humidity wherever human skin, the food for dust mites is found. High levels have been detected in dust obtained from mattresses, pillows, carpets, upholstered furniture, bed covers, clothes and soft toys.  The principal allergen of the house dust mite is found in its feces.  A gram of dust may contain 1,000 mites and 250,000 fecal particles.  Mite antigen is easily measured in the air during house cleaning activities.    1. Encase mattresses, including the box spring, and pillow, in an air tight cover.  Seal the zipper end of the encased mattresses with wide adhesive tape. 2. Wash the bedding in water of 130 degrees Farenheit weekly.  Avoid cotton comforters/quilts and flannel bedding: the most ideal bed covering is the dacron comforter. 3. Remove all upholstered furniture from the bedroom. 4. Remove carpets, carpet padding, rugs, and non-washable window drapes from the bedroom.  Wash drapes weekly or use plastic window coverings. 5. Remove all non-washable stuffed toys from the bedroom.  Wash stuffed toys weekly. 6. Have the room cleaned frequently with a vacuum cleaner and a damp dust-mop.  The patient should not be in a room which is being cleaned and should wait 1 hour after cleaning before going into the room. 7. Close and seal all heating outlets in the bedroom.  Otherwise, the room will become filled with dust-laden air.  An electric heater can be used to heat the room. 8. Reduce indoor humidity to less than 50%.  Do not use a humidifier.

## 2020-06-18 ENCOUNTER — Encounter: Payer: Medicaid Other | Admitting: Physical Therapy

## 2020-06-18 ENCOUNTER — Encounter: Payer: Self-pay | Admitting: Physical Therapy

## 2020-06-18 DIAGNOSIS — R278 Other lack of coordination: Secondary | ICD-10-CM | POA: Diagnosis not present

## 2020-06-18 DIAGNOSIS — M6281 Muscle weakness (generalized): Secondary | ICD-10-CM

## 2020-06-18 NOTE — Therapy (Signed)
[place] at [facility] [address], [place], [place], [address] Phone: [phone]   Fax:  [phone]  Physical Therapy Treatment  Patient Details  Name: Robin [name] MRN: [id number] Date of Birth: Jan 15, 1956 Referring Provider (PT): Dr. Darron Doom   Encounter Date: 06/18/2020   PT End of Session - 06/18/20 1353    Visit Number 4    Date for PT Re-Evaluation 08/13/20    Authorization Type [place] Medicaid Healthy Blue    Authorization - Visit Number 4    Authorization - Number of Visits 49    PT Start Time 5573    PT Stop Time 1351    PT Time Calculation (min) 49 min    Activity Tolerance Patient tolerated treatment well    Behavior During Therapy WFL for tasks assessed/performed           Past Medical History:  Diagnosis Date   Anxiety disorder    Arthritis    Asthma    Chronic neck pain    Constipation    Depression    Diabetes mellitus without complication (HCC)    Dyspnea    GERD (gastroesophageal reflux disease)    Hiatal hernia    small   Hyperlipidemia    Hypertension    Hypothyroidism    Morbid obesity with BMI of 50.0-59.9, adult ([place])    Schatzki's ring    non critical   Sinusitis    Sleep apnea    CPAP, Sleep study at [place]   Sleep apnea     Past Surgical History:  Procedure Laterality Date   ABDOMINAL HYSTERECTOMY     ABDOMINAL HYSTERECTOMY  02/18/2000   CARDIAC CATHETERIZATION  08/18/2003   normal L main/LAD/L Cfx/RCA (Dr. Adora Fridge)   COLONOSCOPY  2006   Dr. [name], hyperplastic polyps   COLONOSCOPY  08/06/11   abnormal terminal ileum for 10cm, erosions, geographical ulceration. Bx small bowel mucosa with prominent intramucosal lymphoid aggregates, slightly inflammed   COLONOSCOPY WITH PROPOFOL N/A 12/28/2019   Rourk: 4 sessile serrated adenomas removed on the colon.  Next colonoscopy in 3 years.   DIRECT LARYNGOSCOPY  03/15/2012    Procedure: DIRECT LARYNGOSCOPY;  Surgeon: [name], MD;  Location: [place];  Service: ENT;  Laterality: N/A; Dr. Wolicki--:>no foreign body seen. normal esophagus to 40cm   ESOPHAGEAL DILATION  04/17/2015   Procedure: ESOPHAGEAL DILATION;  Surgeon: [name], MD;  [facility];  Service: Endoscopy;;   ESOPHAGOGASTRODUODENOSCOPY  08/23/2008   UKG:URKY distal esophageal erosions consistent with mild erosive reflux esophagitis, otherwise unremarkable esophagus/ Tiny antral erosions of doubtful clinical significance, otherwise normal stomach, patent pylorus, normal D1 and D2   ESOPHAGOGASTRODUODENOSCOPY  08/06/11   small hh, noncritical Schatzki's ring s/p 76 F   ESOPHAGOGASTRODUODENOSCOPY N/A 04/17/2015   HCW:CBJSEG s/p dilation   ESOPHAGOGASTRODUODENOSCOPY (EGD) WITH PROPOFOL N/A 01/20/2018   Dr. [name]: Erosive gastropathy, normal-appearing esophagus status post empiric dilation.  Chronic gastritis, no H. pylori.   ESOPHAGOSCOPY  03/15/2012   Procedure: ESOPHAGOSCOPY;  Surgeon: [name], MD;  Location: [place];  Service: ENT;  Laterality: N/A;   KNEE ARTHROSCOPY  04/27/2001   MALONEY DILATION N/A 01/20/2018   Procedure: Venia Minks DILATION;  Surgeon: [name], MD;  [facility];  Service: Endoscopy;  Laterality: N/A;   NM MYOCAR PERF WALL MOTION  2003   negative bruce protocol exercise tress test; EF 68%; intermediate risk study due to evidence  of anterior wall ischemia extending from mid-ventricle to apex   PITUITARY SURGERY  06/2012   benign tumor, surgeon at [facility]   POLYPECTOMY  12/28/2019   Procedure: POLYPECTOMY;  Surgeon: [name], MD;  [facility];  Service: Endoscopy;;   RECTOCELE REPAIR     TRANSTHORACIC ECHOCARDIOGRAM  2003   EF normal   VAGINAL PROLAPSE REPAIR     VIDEO BRONCHOSCOPY Bilateral 03/08/2017   Procedure: VIDEO BRONCHOSCOPY WITHOUT FLUORO;  Surgeon: Javier Glazier, MD;  Location: [place];  Service:  Cardiopulmonary;  Laterality: Bilateral;    There were no vitals filed for this visit.   Subjective Assessment - 06/18/20 1303    Subjective I did not sleep well last night. I have a hard time making it to the bathroom. Some days I leak before I go to the bathroom. When I lift up on something and I will leak urine. I am doing the exercises daily. I catch myself holding my breath. The pelvic pain is better. I still have the UTI feeling.    Patient Stated Goals reduce the urinary leakage    Pain Score 5     Pain Location Pelvis    Pain Orientation Mid;Lower    Pain Descriptors / Indicators Shooting    Pain Type Acute pain    Pain Onset More than a month ago    Pain Frequency Intermittent    Aggravating Factors  When she is holding her urine    Pain Relieving Factors after urination                             [facility] Adult PT Treatment/Exercise - 06/18/20 0001      Lumbar Exercises: Seated   Sit to Stand 5 reps    Sit to Stand Limitations going from w/c to stand with leaning on mat followed by gluteal squeeze    Other Seated Lumbar Exercises press down on foam roll with abdominal contraction and breathing out 10x    Other Seated Lumbar Exercises seated trunk rotation with red band, squeeze a pillow and pull away with pelvic floor contraction 10x 2 each way      Shoulder Exercises: Seated   Other Seated Exercises shoulder flexion bil. with pillow squeeze using red band to engage the pelvic floor and abdominals    Other Seated Exercises shoulder press  with red band and pillow between knees using red band 3x10                  PT Education - 06/18/20 1353    Education Details Access Code: [email or web address]    Person(s) Educated Patient    Methods Explanation;Demonstration;Handout    Comprehension Verbalized understanding;Returned demonstration            PT Short Term Goals - 06/18/20 1358      PT SHORT TERM GOAL #1   Title independent with initial HEP     Time 4    Period Weeks    Status Achieved      PT SHORT TERM GOAL #2   Title understand how to moisturize the vaginal canal and vulvar area to reduce dryness and burning    Time 4    Period Weeks    Status Achieved             PT Long Term Goals - 06/18/20 1358      PT LONG TERM GOAL #1   Title  Pt will be independent with progression of final HEP    Baseline still learning    Time 12    Period Weeks    Status On-going      PT LONG TERM GOAL #2   Title understand what bladder irritants area and how the affect the bladder.    Baseline not educated yet    Time 12    Period Weeks    Status New      PT LONG TERM GOAL #3   Title changes her clothes 2 times per day compared to 5 times per day, due to urinating leakage improving    Baseline changes clothes 5 times per day    Period Weeks    Status On-going      PT LONG TERM GOAL #4   Title buring in the vaginal area decreased </= 1-2/10 due to understanding proper vaginal health and improving the moisture in the vaginal area    Baseline educated    Time 12    Period Weeks    Status On-going                 Plan - 06/18/20 1354    Clinical Impression Statement Patient reports her pelvic pain is better. She continues to have leakage and thinking about pads. Patient still has symptoms that are similiar to UTI but she will start to use the coconut oil regularly. Patient is exercising her arms for core stabilization and to assist her from transfers. Patient is trying to not hold her breath when transfering but still needs to work on it. Patient was not out of breath with a full session of exercise this time. Her exercises had pillow squeeze so she would incorporate the pelvic floor. Patient will benefit from skilled therapy to improve pelvic floor strength and coordination to reduce urinary leakage and education on vaginal care.    Personal Factors and Comorbidities Age;Sex;Comorbidity 3+    Comorbidities Trans abdominal  hysterectomy; AP repair with TVT; Diabetes; Fibromyalgia; Osteoarthritis; Obstructive sleep apnea    Examination-Activity Limitations Continence;Locomotion Level;Transfers;Toileting;Stand;Bed Mobility    Examination-Participation Restrictions Community Activity;Interpersonal Relationship    Stability/Clinical Decision Making Evolving/Moderate complexity    Rehab Potential Good    PT Frequency 1x / week    PT Duration 12 weeks    PT Treatment/Interventions ADLs/Self Care Home Management;Biofeedback;Electrical Stimulation;Cryotherapy;Moist Heat;Therapeutic activities;Therapeutic exercise;Functional mobility training;Manual techniques;Patient/family education;Neuromuscular re-education    PT Next Visit Plan stretch hips, continute with sit to stand, review past exercises; on 11/9 write renewal; discuss bladder irritants    PT Home Exercise Plan Access Code: [email or web address]    Consulted and Agree with Plan of Care Patient           Patient will benefit from skilled therapeutic intervention in order to improve the following deficits and impairments:  Decreased endurance, Decreased skin integrity, Decreased activity tolerance, Decreased strength, Pain, Decreased mobility, Decreased coordination  Visit Diagnosis: Muscle weakness (generalized)  Other lack of coordination     Problem List Patient Active Problem List   Diagnosis Date Noted   Polyp of descending colon 02/15/2020   Multilevel degenerative disc disease 10/31/2018   Abdominal pain 10/28/2018   Change in bowel function 10/28/2018   Gastritis and gastroduodenitis 04/26/2018   Atrophic vaginitis 04/01/2018   Diabetes mellitus ([place]) 03/31/2018   Cough, persistent 02/08/2018   Dysphagia 12/14/2017   Generalized OA 07/30/2017   Urinary incontinence 07/23/2017   Early satiety 06/16/2017   Hx of iron deficiency anemia 05/28/2017  Throat pain in adult 04/05/2017   Prediabetes 02/04/2017   Dyspnea on exertion 12/23/2016    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 12/23/2016   Hemoptysis 12/23/2016   Fibromyalgia 08/18/2016   Chronic lymphocytic thyroiditis 08/18/2016   Depression 04/01/2016   OSA (obstructive sleep apnea) 04/01/2016   Essential hypertension 12/09/2015   Hypothyroidism 12/09/2015   Morbid obesity due to excess calories ([place]) 12/09/2015   Constipation 05/27/2015   Fatty liver 05/27/2015   Abdominal pain, chronic, epigastric 07/04/2012   Anxiety 06/09/2012   History of gastroesophageal reflux (GERD) 06/09/2012   Hyperlipidemia 06/09/2012   Refusal of blood transfusions as patient is Jehovah's Witness 06/09/2012   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 [address] [place], [place], [address] Phone: [phone]   Fax:  [phone]  Name: CANARY FISTER MRN: [id number] Date of Birth: 1956/04/18

## 2020-06-18 NOTE — Patient Instructions (Signed)
Access Code: [email or web address] URL: [email or web address] Date: 06/18/2020 Prepared by: Earlie Counts  Program Notes circular massage on the bottom of your stomach 1 time daily for 3 minutes    Exercises  Seated Trunk Rotation with Anchored Resistance - 1 x daily - 7 x weekly - 3 sets - 10 reps Seated Trunk Rotation with Anchored Resistance - 1 x daily - 4 x weekly - 2 sets - 10 reps Seated Elbow Flexion with Resistance Under Foot - 1 x daily - 4 x weekly - 1 sets - 10 reps Seated Shoulder Forward Press with Resistance - 1 x daily - 4 x weekly - 2 sets - 10 reps Earlie Counts, PT [facility] [address] [place], [place] [address] W: [phone] [email or web address]

## 2020-06-25 ENCOUNTER — Ambulatory Visit (HOSPITAL_BASED_OUTPATIENT_CLINIC_OR_DEPARTMENT_OTHER): Payer: Medicaid Other | Admitting: Family

## 2020-06-25 ENCOUNTER — Encounter: Payer: Medicaid Other | Admitting: Physical Therapy

## 2020-06-25 DIAGNOSIS — Z5329 Procedure and treatment not carried out because of patient's decision for other reasons: Secondary | ICD-10-CM

## 2020-06-25 NOTE — Progress Notes (Signed)
Patient did not show for appointment.   

## 2020-07-04 ENCOUNTER — Other Ambulatory Visit: Payer: Self-pay | Admitting: Internal Medicine

## 2020-07-04 DIAGNOSIS — R7303 Prediabetes: Secondary | ICD-10-CM

## 2020-07-04 MED FILL — ACCU-CHEK AVIVA PLUS TEST S: 30 days supply | Qty: 100 | Fill #0

## 2020-07-09 ENCOUNTER — Other Ambulatory Visit: Payer: Self-pay

## 2020-07-09 ENCOUNTER — Encounter: Payer: Medicaid Other | Attending: Family Medicine | Admitting: Physical Therapy

## 2020-07-09 ENCOUNTER — Encounter: Payer: Self-pay | Admitting: Physical Therapy

## 2020-07-09 DIAGNOSIS — M6281 Muscle weakness (generalized): Secondary | ICD-10-CM | POA: Insufficient documentation

## 2020-07-09 DIAGNOSIS — R278 Other lack of coordination: Secondary | ICD-10-CM | POA: Diagnosis not present

## 2020-07-09 DIAGNOSIS — E7800 Pure hypercholesterolemia, unspecified: Secondary | ICD-10-CM | POA: Diagnosis not present

## 2020-07-09 DIAGNOSIS — R2681 Unsteadiness on feet: Secondary | ICD-10-CM | POA: Diagnosis not present

## 2020-07-09 DIAGNOSIS — R262 Difficulty in walking, not elsewhere classified: Secondary | ICD-10-CM | POA: Insufficient documentation

## 2020-07-09 DIAGNOSIS — E1165 Type 2 diabetes mellitus with hyperglycemia: Secondary | ICD-10-CM | POA: Diagnosis not present

## 2020-07-09 DIAGNOSIS — Z23 Encounter for immunization: Secondary | ICD-10-CM | POA: Diagnosis not present

## 2020-07-09 DIAGNOSIS — M25561 Pain in right knee: Secondary | ICD-10-CM | POA: Diagnosis not present

## 2020-07-09 DIAGNOSIS — G8929 Other chronic pain: Secondary | ICD-10-CM | POA: Diagnosis not present

## 2020-07-09 DIAGNOSIS — R5383 Other fatigue: Secondary | ICD-10-CM | POA: Diagnosis not present

## 2020-07-09 DIAGNOSIS — R0989 Other specified symptoms and signs involving the circulatory and respiratory systems: Secondary | ICD-10-CM | POA: Diagnosis not present

## 2020-07-09 DIAGNOSIS — M25562 Pain in left knee: Secondary | ICD-10-CM | POA: Diagnosis not present

## 2020-07-09 DIAGNOSIS — Z20822 Contact with and (suspected) exposure to covid-19: Secondary | ICD-10-CM | POA: Diagnosis not present

## 2020-07-09 DIAGNOSIS — E559 Vitamin D deficiency, unspecified: Secondary | ICD-10-CM | POA: Diagnosis not present

## 2020-07-09 NOTE — Therapy (Signed)
[place] at [facility] [address] [place], [place], [address] Phone: [phone]   Fax:  [phone]  Physical Therapy Treatment  Patient Details  Name: Robin [name] MRN: [id number] Date of Birth: 08-06-1956 Referring Provider (PT): Dr. Darron Doom   Encounter Date: 07/09/2020   PT End of Session - 07/09/20 1633    Visit Number 5    Date for PT Re-Evaluation 08/13/20    Authorization Type [place] Medicaid Healthy Blue    Authorization - Visit Number 5    Authorization - Number of Visits 27    PT Start Time 2229   stuck in traffec   PT Stop Time 1500    PT Time Calculation (min) 49 min    Activity Tolerance Patient tolerated treatment well    Behavior During Therapy [facility] for tasks assessed/performed           Past Medical History:  Diagnosis Date  . Anxiety disorder   . Arthritis   . Asthma   . Chronic neck pain   . Constipation   . Depression   . Diabetes mellitus without complication ([place])   . Dyspnea   . GERD (gastroesophageal reflux disease)   . Hiatal hernia    small  . Hyperlipidemia   . Hypertension   . Hypothyroidism   . Morbid obesity with BMI of 50.0-59.9, adult ([place])   . Schatzki's ring    non critical  . Sinusitis   . Sleep apnea    CPAP, Sleep study at [place]  . Sleep apnea     Past Surgical History:  Procedure Laterality Date  . ABDOMINAL HYSTERECTOMY    . ABDOMINAL HYSTERECTOMY  02/18/2000  . CARDIAC CATHETERIZATION  08/18/2003   normal L main/LAD/L Cfx/RCA (Dr. Adora Fridge)  . COLONOSCOPY  2006   Dr. [name], hyperplastic polyps  . COLONOSCOPY  08/06/11   abnormal terminal ileum for 10cm, erosions, geographical ulceration. Bx small bowel mucosa with prominent intramucosal lymphoid aggregates, slightly inflammed  . COLONOSCOPY WITH PROPOFOL N/A 12/28/2019   Rourk: 4 sessile serrated adenomas removed on the colon.  Next colonoscopy in 3 years.  Marland Kitchen DIRECT LARYNGOSCOPY   03/15/2012   Procedure: DIRECT LARYNGOSCOPY;  Surgeon: [name], MD;  Location: [place];  Service: ENT;  Laterality: N/A; Dr. Wolicki--:>no foreign body seen. normal esophagus to 40cm  . ESOPHAGEAL DILATION  04/17/2015   Procedure: ESOPHAGEAL DILATION;  Surgeon: [name], MD;  [facility];  Service: Endoscopy;;  . ESOPHAGOGASTRODUODENOSCOPY  08/23/2008   NLG:XQJJ distal esophageal erosions consistent with mild erosive reflux esophagitis, otherwise unremarkable esophagus/ Tiny antral erosions of doubtful clinical significance, otherwise normal stomach, patent pylorus, normal D1 and D2  . ESOPHAGOGASTRODUODENOSCOPY  08/06/11   small hh, noncritical Schatzki's ring s/p 89 F  . ESOPHAGOGASTRODUODENOSCOPY N/A 04/17/2015   HER:DEYCXK s/p dilation  . ESOPHAGOGASTRODUODENOSCOPY (EGD) WITH PROPOFOL N/A 01/20/2018   Dr. [name]: Erosive gastropathy, normal-appearing esophagus status post empiric dilation.  Chronic gastritis, no H. pylori.  . ESOPHAGOSCOPY  03/15/2012   Procedure: ESOPHAGOSCOPY;  Surgeon: [name], MD;  Location: [place];  Service: ENT;  Laterality: N/A;  . KNEE ARTHROSCOPY  04/27/2001  . MALONEY DILATION N/A 01/20/2018   Procedure: Venia Minks DILATION;  Surgeon: [name], MD;  [facility];  Service: Endoscopy;  Laterality: N/A;  . NM MYOCAR PERF WALL MOTION  2003   negative bruce protocol exercise tress test; EF 68%; intermediate risk  study due to evidence of anterior wall ischemia extending from mid-ventricle to apex  . PITUITARY SURGERY  06/2012   benign tumor, surgeon at [facility]  . POLYPECTOMY  12/28/2019   Procedure: POLYPECTOMY;  Surgeon: [name], MD;  [facility];  Service: Endoscopy;;  . RECTOCELE REPAIR    . TRANSTHORACIC ECHOCARDIOGRAM  2003   EF normal  . VAGINAL PROLAPSE REPAIR    . VIDEO BRONCHOSCOPY Bilateral 03/08/2017   Procedure: VIDEO BRONCHOSCOPY WITHOUT FLUORO;  Surgeon: Javier Glazier, MD;  Location: [place];  Service: Cardiopulmonary;  Laterality: Bilateral;    There were no vitals filed for this visit.   Subjective Assessment - 07/09/20 1613    Subjective I have been doing my exercises. My fibromyalgia is acting up. The leakage is no better. I had an accident the other night. I have 3 accidents per day. When has a bowel movement she will have pressure on my bladder.    Patient Stated Goals reduce the urinary leakage                          Pelvic Floor Special Questions - 07/09/20 0001    Pelvic Floor Internal Exam Patient confirms identification and approves PT to assess pelvic floor and treatment    Exam Type Vaginal    Palpation more of a circular cotraction and almost a lift    Strength weak squeeze, no lift             [facility] Adult PT Treatment/Exercise - 07/09/20 0001      Self-Care   Self-Care Other Self-Care Comments    Other Self-Care Comments  instructed patient on how to relax the pelvic floro when urinating and souble voiding so she can fully empty her bladder. Discussed with patient on what bladder irritants are and how they affect the bladder and to take one day to remove them from her diet to see if there is a change inkleakage.       Neuro Re-ed    Neuro Re-ed Details  using therapist finger to give tactild cues to the pelvic floor muscles for more of a circular contraction, patient able to hold contraction for 2 seconds but needs to count out loud due to holding her breath, when she lifts her head or arm she loses the pelvic floor contraction                  PT Education - 07/09/20 1632    Education Details education on bladder irritants, how to double void    Person(s) Educated Patient    Methods Explanation;Handout    Comprehension Verbalized understanding            PT Short Term Goals - 06/18/20 1358      PT SHORT TERM GOAL #1   Title independent with initial HEP    Time 4    Period Weeks    Status Achieved       PT SHORT TERM GOAL #2   Title understand how to moisturize the vaginal canal and vulvar area to reduce dryness and burning    Time 4    Period Weeks    Status Achieved             PT Long Term Goals - 07/09/20 1710      PT LONG TERM GOAL #1   Title Pt will be independent with progression of final HEP  Baseline still learning    Time 12    Period Weeks    Status On-going      PT LONG TERM GOAL #2   Title understand what bladder irritants area and how the affect the bladder.    Time 12    Period Weeks    Status Achieved      PT LONG TERM GOAL #3   Title changes her clothes 2 times per day compared to 5 times per day, due to urinating leakage improving    Baseline changes clothes 5 times per day; leaks 3 times per day    Time 5    Period Weeks    Status On-going      PT LONG TERM GOAL #4   Title buring in the vaginal area decreased </= 1-2/10 due to understanding proper vaginal health and improving the moisture in the vaginal area    Time 12    Period Weeks    Status On-going                 Plan - 07/09/20 1634    Clinical Impression Statement Patient can hold pelvic floor contraction for 2-3 seconds. She needs to count out loud to not hold her breath when she contract the pelvic floor. Patient is not able to hold a contraction when she lifts her arm or head. Patietn is leaking urine 3 times per day. she will double void but having trouble to relax the pelvic floor after the second void therefore went over strategies to help her. Patient will benefit from skilled therapy to improve pelvic floor strength and coordination to reduce urinary leakage.    Personal Factors and Comorbidities Age;Sex;Comorbidity 3+    Comorbidities Trans abdominal hysterectomy; AP repair with TVT; Diabetes; Fibromyalgia; Osteoarthritis; Obstructive sleep apnea    Examination-Activity Limitations Continence;Locomotion Level;Transfers;Toileting;Stand;Bed Mobility    Examination-Participation  Restrictions Community Activity;Interpersonal Relationship    Stability/Clinical Decision Making Evolving/Moderate complexity    Rehab Potential Good    PT Frequency 1x / week    PT Duration 12 weeks    PT Treatment/Interventions ADLs/Self Care Home Management;Biofeedback;Electrical Stimulation;Cryotherapy;Moist Heat;Therapeutic activities;Therapeutic exercise;Functional mobility training;Manual techniques;Patient/family education;Neuromuscular re-education    PT Next Visit Plan stretch hips, continute with sit to stand,  ; internal work to get contraction and move an extremity; on 11/9 write renewal    PT Home Exercise Plan Access Code: [email or web address]    Consulted and Agree with Plan of Care Patient           Patient will benefit from skilled therapeutic intervention in order to improve the following deficits and impairments:  Decreased endurance, Decreased skin integrity, Decreased activity tolerance, Decreased strength, Pain, Decreased mobility, Decreased coordination  Visit Diagnosis: Muscle weakness (generalized)  Other lack of coordination  Difficulty in walking, not elsewhere classified  Unsteadiness on feet     Problem List Patient Active Problem List   Diagnosis Date Noted  . Polyp of descending colon 02/15/2020  . Multilevel degenerative disc disease 10/31/2018  . Abdominal pain 10/28/2018  . Change in bowel function 10/28/2018  . Gastritis and gastroduodenitis 04/26/2018  . Atrophic vaginitis 04/01/2018  . Diabetes mellitus ([place]) 03/31/2018  . Cough, persistent 02/08/2018  . Dysphagia 12/14/2017  . 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 Urinary incontinence 07/23/2017  . Early satiety 06/16/2017  . Hx of iron deficiency anemia 05/28/2017  . Throat pain in adult 04/05/2017  . Prediabetes 02/04/2017  . Dyspnea on exertion 12/23/2016  . 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 Hemoptysis 12/23/2016  . Fibromyalgia  08/18/2016  . Chronic lymphocytic thyroiditis 08/18/2016  . Depression 04/01/2016  . OSA (obstructive sleep apnea) 04/01/2016  . Essential hypertension 12/09/2015  . Hypothyroidism 12/09/2015  . Morbid obesity due to excess calories ([place]) 12/09/2015  . Constipation 05/27/2015  . Fatty liver 05/27/2015  . Abdominal pain, chronic, epigastric 07/04/2012  . Anxiety 06/09/2012  . History of gastroesophageal reflux (GERD) 06/09/2012  . Hyperlipidemia 06/09/2012  . Refusal of blood transfusions as patient is Jehovah's Witness 06/09/2012  . 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 Abnormal small bowel biopsy 09/18/2011  . Lactose intolerance 09/18/2011  . GERD 01/21/2010    Earlie Counts, PT 07/09/20 5:12 PM   [place] [facility] at [facility] [address] [place], [place], [address] Phone: [phone]   Fax:  [phone]  Name: Robin [name] MRN: [id number] Date of Birth: [birth year]

## 2020-07-09 NOTE — Patient Instructions (Addendum)
Double Voiding can be a very useful technique to help overcome incomplete emptying of your bladder.  Incomplete emptying of urine can result in leakage after using the bathroom and increase the risk of urinary tract infection.   Initial Void: When you first sit down to urinate, ensure optimal positioning for bladder emptying by following these guidelines for toileting posture: Sit on the toilet seat - don't hover over the seat Support your trunk by placing your hands on your knees or thighs Spread your knees and hips wide Position your feet flat on the floor or elevate feet on phone books, foot stool (Squatty Potty), or wrapped toilet paper rolls (if having knees above hips helps you empty) Lean forward from your hips Maintain the normal inward curve in your lower back   Repeated Void: After your initial void is complete, follow these movement patterns and attempt going to the bathroom again. Stand up Rotate your hips as if doing hula hoop in one direction Rotate using the same action in the other direction Rock your hips and pelvis back and forwards ("pelvic tilts") Rock your hips and pelvis side to side ("tail wag") Sit back down and repeat your voiding technique This technique can be repeated as many times as you choose to help you empty your bladder more effectively.  Certain foods and liquids will decrease the pH making the urine more acidic.  Urinary urgency increases when the urine has a low pH.  Most common irritants: alcohol, carbonated beverages and caffinated beverages.  Foods to avoid: apple juice, apples, ascorbic acid, canteloupes, chili, citrus fruits, coffee, cranberries, grapes, guava, peaches, pepper, pineapple, plums, strawberries, tea, tomatoes, and vinegar.  Drinking plenty of water may help to increase the pH and dilute out any of the effects of specific irritants.  Foods that are NOT irritating to the bladder include: Pears, papayas, sun-brewed teas,  watermelons, non-citrus herbal teas, apricots, kava and low-acid instant drinks (Postum)  One day take away the soda and flavored water to see if there is a change in your urinary leakage.  Earlie Counts, PT [facility] [address] [place], [place] [address] W: [phone] [email or web address]

## 2020-07-15 MED FILL — LEVOTHYROXINE 88 MCG TABLET: 88 | 30 days supply | Qty: 30 | Fill #2

## 2020-07-16 ENCOUNTER — Other Ambulatory Visit: Payer: Self-pay

## 2020-07-16 ENCOUNTER — Encounter: Payer: Medicaid Other | Admitting: Physical Therapy

## 2020-07-16 ENCOUNTER — Encounter: Payer: Self-pay | Admitting: Physical Therapy

## 2020-07-16 ENCOUNTER — Other Ambulatory Visit: Payer: Self-pay | Admitting: Internal Medicine

## 2020-07-16 DIAGNOSIS — R2681 Unsteadiness on feet: Secondary | ICD-10-CM | POA: Diagnosis not present

## 2020-07-16 DIAGNOSIS — M6281 Muscle weakness (generalized): Secondary | ICD-10-CM

## 2020-07-16 DIAGNOSIS — R278 Other lack of coordination: Secondary | ICD-10-CM

## 2020-07-16 DIAGNOSIS — R262 Difficulty in walking, not elsewhere classified: Secondary | ICD-10-CM | POA: Diagnosis not present

## 2020-07-16 NOTE — Therapy (Signed)
[place] at [facility] [address], [place], [place], [address] Phone: [phone]   Fax:  [phone]  Physical Therapy Treatment  Patient Details  Name: Robin [name] MRN: [id number] Date of Birth: 06-Mar-1956 Referring Provider (PT): Dr. Darron Doom   Encounter Date: 07/16/2020   PT End of Session - 07/16/20 1109    Visit Number 6    Date for PT Re-Evaluation 08/13/20    Authorization Type [place] Medicaid [employer]    Authorization - Visit Number 6    Authorization - Number of Visits 27    PT Start Time 1108    PT Stop Time 1205    PT Time Calculation (min) 57 min    Activity Tolerance Patient tolerated treatment well    Behavior During Therapy [facility] for tasks assessed/performed           Past Medical History:  Diagnosis Date  . Anxiety disorder   . Arthritis   . Asthma   . Chronic neck pain   . Constipation   . Depression   . Diabetes mellitus without complication ([place])   . Dyspnea   . GERD (gastroesophageal reflux disease)   . Hiatal hernia    small  . Hyperlipidemia   . Hypertension   . Hypothyroidism   . Morbid obesity with BMI of 50.0-59.9, adult ([place])   . Schatzki's ring    non critical  . Sinusitis   . Sleep apnea    CPAP, Sleep study at [place]  . Sleep apnea     Past Surgical History:  Procedure Laterality Date  . ABDOMINAL HYSTERECTOMY    . ABDOMINAL HYSTERECTOMY  02/18/2000  . CARDIAC CATHETERIZATION  08/18/2003   normal L main/LAD/L Cfx/RCA (Dr. Adora Fridge)  . COLONOSCOPY  2006   Dr. [name], hyperplastic polyps  . COLONOSCOPY  08/06/11   abnormal terminal ileum for 10cm, erosions, geographical ulceration. Bx small bowel mucosa with prominent intramucosal lymphoid aggregates, slightly inflammed  . COLONOSCOPY WITH PROPOFOL N/A 12/28/2019   Rourk: 4 sessile serrated adenomas removed on the colon.  Next colonoscopy in 3 years.  Marland Kitchen DIRECT LARYNGOSCOPY  03/15/2012    Procedure: DIRECT LARYNGOSCOPY;  Surgeon: [name], MD;  Location: [facility];  Service: ENT;  Laterality: N/A; Dr. Wolicki--:>no foreign body seen. normal esophagus to 40cm  . ESOPHAGEAL DILATION  04/17/2015   Procedure: ESOPHAGEAL DILATION;  Surgeon: [name], MD;  [facility];  Service: Endoscopy;;  . ESOPHAGOGASTRODUODENOSCOPY  08/23/2008   FIE:PPIR distal esophageal erosions consistent with mild erosive reflux esophagitis, otherwise unremarkable esophagus/ Tiny antral erosions of doubtful clinical significance, otherwise normal stomach, patent pylorus, normal D1 and D2  . ESOPHAGOGASTRODUODENOSCOPY  08/06/11   small hh, noncritical Schatzki's ring s/p 39 F  . ESOPHAGOGASTRODUODENOSCOPY N/A 04/17/2015   JJO:ACZYSA s/p dilation  . ESOPHAGOGASTRODUODENOSCOPY (EGD) WITH PROPOFOL N/A 01/20/2018   Dr. [name]: Erosive gastropathy, normal-appearing esophagus status post empiric dilation.  Chronic gastritis, no H. pylori.  . ESOPHAGOSCOPY  03/15/2012   Procedure: ESOPHAGOSCOPY;  Surgeon: [name], MD;  Location: [facility];  Service: ENT;  Laterality: N/A;  . KNEE ARTHROSCOPY  04/27/2001  . MALONEY DILATION N/A 01/20/2018   Procedure: Venia Minks DILATION;  Surgeon: [name], MD;  [facility];  Service: Endoscopy;  Laterality: N/A;  . NM MYOCAR PERF WALL MOTION  2003   negative bruce protocol exercise tress test; EF 68%; intermediate risk study due to evidence  of anterior wall ischemia extending from mid-ventricle to apex  . PITUITARY SURGERY  06/2012   benign tumor, surgeon at [facility]  . POLYPECTOMY  12/28/2019   Procedure: POLYPECTOMY;  Surgeon: [name], MD;  [facility];  Service: Endoscopy;;  . RECTOCELE REPAIR    . TRANSTHORACIC ECHOCARDIOGRAM  2003   EF normal  . VAGINAL PROLAPSE REPAIR    . VIDEO BRONCHOSCOPY Bilateral 03/08/2017   Procedure: VIDEO BRONCHOSCOPY WITHOUT FLUORO;  Surgeon: Javier Glazier, MD;  Location: [facility];  Service:  Cardiopulmonary;  Laterality: Bilateral;    There were no vitals filed for this visit.   Subjective Assessment - 07/16/20 1111    Subjective Patient is not on the commode as long. I have to urinate 3 times before I finish emptying my bladder.    Patient Stated Goals reduce the urinary leakage                             [facility] Adult PT Treatment/Exercise - 07/16/20 0001      Self-Care   Self-Care Other Self-Care Comments    Other Self-Care Comments  assisted and educated patient on ordering incontinence products to have delivered to her home, helped her fill out her form      Therapeutic Activites    Therapeutic Activities Other Therapeutic Activities    Other Therapeutic Activities instruction on double voiding to fully empty her baldder using hip motions and pelvic rotations      Lumbar Exercises: Seated   Other Seated Lumbar Exercises Bilateral shoulder extension with red band and ball squeeze to engage the ppelvic floor. press down on foam roll with abdominal and pelvic contraction and breathing out 15x and holding 5 sec    Other Seated Lumbar Exercises seated with ball squeeze, abdominal and pelvic floor contraction bringing red band across body 10x each direction for 2 sets      Knee/Hip Exercises: Seated   Clamshell with TheraBand Red   pelvic floor contraction 3x10                 PT Education - 07/16/20 1205    Education Details double voiding technique to fully empty the bladder    Person(s) Educated Patient    Methods Explanation;Demonstration;Handout    Comprehension Verbalized understanding            PT Short Term Goals - 06/18/20 1358      PT SHORT TERM GOAL #1   Title independent with initial HEP    Time 4    Period Weeks    Status Achieved      PT SHORT TERM GOAL #2   Title understand how to moisturize the vaginal canal and vulvar area to reduce dryness and burning    Time 4    Period Weeks    Status Achieved              PT Long Term Goals - 07/16/20 1209      PT LONG TERM GOAL #1   Title Pt will be independent with progression of final HEP    Baseline still learning    Time 12    Period Weeks    Status On-going      PT LONG TERM GOAL #2   Title understand what bladder irritants area and how the affect the bladder.    Time 12    Period Weeks    Status Achieved  PT LONG TERM GOAL #3   Title changes her clothes 2 times per day compared to 5 times per day, due to urinating leakage improving    Baseline changes clothes 5 times per day; leaks 3 times per day    Time 5    Period Weeks    Status On-going      PT LONG TERM GOAL #4   Title buring in the vaginal area decreased </= 1-2/10 due to understanding proper vaginal health and improving the moisture in the vaginal area    Baseline educated    Period Weeks    Status On-going                 Plan - 07/16/20 1110    Clinical Impression Statement Patient reports she is not sitting on the commode as long. Sometimes she may have to void 3 times before she fully feels like she is emptying her baldder. Therapist discussed with patient on how to double void to assist that. Paitent has been working on her sit to stand at home. Patient feels like she will need depends. Therapist has assisted signeing her up for depends to be delivered to her home and go through [place]. Patient will benefit from skilled therapy to improve pelvic floor strength and coordination to reduce urinary leakage.    Personal Factors and Comorbidities Age;Sex;Comorbidity 3+    Comorbidities Trans abdominal hysterectomy; AP repair with TVT; Diabetes; Fibromyalgia; Osteoarthritis; Obstructive sleep apnea    Examination-Activity Limitations Continence;Locomotion Level;Transfers;Toileting;Stand;Bed Mobility    Examination-Participation Restrictions Community Activity;Interpersonal Relationship    Stability/Clinical Decision Making Evolving/Moderate complexity    Rehab  Potential Good    PT Frequency 1x / week    PT Duration 12 weeks    PT Treatment/Interventions ADLs/Self Care Home Management;Biofeedback;Electrical Stimulation;Cryotherapy;Moist Heat;Therapeutic activities;Therapeutic exercise;Functional mobility training;Manual techniques;Patient/family education;Neuromuscular re-education    PT Next Visit Plan stretch hips, continute with sit to stand,  ; internal work to get contraction and move an extremity; on 11/9 write renewal; ask about the burning in the vaginal area    PT Home Exercise Plan Access Code: [email or web address]    Consulted and Agree with Plan of Care Patient           Patient will benefit from skilled therapeutic intervention in order to improve the following deficits and impairments:  Decreased endurance, Decreased skin integrity, Decreased activity tolerance, Decreased strength, Pain, Decreased mobility, Decreased coordination  Visit Diagnosis: Muscle weakness (generalized)  Other lack of coordination     Problem List Patient Active Problem List   Diagnosis Date Noted  . Polyp of descending colon 02/15/2020  . Multilevel degenerative disc disease 10/31/2018  . Abdominal pain 10/28/2018  . Change in bowel function 10/28/2018  . Gastritis and gastroduodenitis 04/26/2018  . Atrophic vaginitis 04/01/2018  . Diabetes mellitus ([place]) 03/31/2018  . Cough, persistent 02/08/2018  . Dysphagia 12/14/2017  . 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 Urinary incontinence 07/23/2017  . Early satiety 06/16/2017  . Hx of iron deficiency anemia 05/28/2017  . Throat pain in adult 04/05/2017  . Prediabetes 02/04/2017  . Dyspnea on exertion 12/23/2016  . 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 Hemoptysis 12/23/2016  . Fibromyalgia 08/18/2016  . Chronic lymphocytic thyroiditis 08/18/2016  . Depression 04/01/2016  . OSA (obstructive sleep apnea) 04/01/2016  . Essential hypertension 12/09/2015  .  Hypothyroidism 12/09/2015  . Morbid obesity due to excess calories ([place]) 12/09/2015  . Constipation 05/27/2015  . Fatty liver 05/27/2015  .  Abdominal pain, chronic, epigastric 07/04/2012  . Anxiety 06/09/2012  . History of gastroesophageal reflux (GERD) 06/09/2012  . Hyperlipidemia 06/09/2012  . Refusal of blood transfusions as patient is Jehovah's Witness 06/09/2012  . 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 Abnormal small bowel biopsy 09/18/2011  . Lactose intolerance 09/18/2011  . GERD 01/21/2010    Earlie Counts, PT 07/16/20 12:11 PM   [facility] [address] [place], [place], [address] Phone: [phone]   Fax:  [phone]  Name: Robin [name] MRN: [id number] Date of Birth: Jul 20, 1956

## 2020-07-16 NOTE — Patient Instructions (Addendum)
Double Voiding can be a very useful technique to help overcome incomplete emptying of your bladder.  Incomplete emptying of urine can result in leakage after using the bathroom and increase the risk of urinary tract infection. ° ° °Initial Void: °When you first sit down to urinate, ensure optimal positioning for bladder emptying by following these guidelines for toileting posture: °Sit on the toilet seat - don’t hover over the seat °Support your trunk by placing your hands on your knees or thighs °Spread your knees and hips wide °Position your feet flat on the floor or elevate feet on phone books, foot stool (Squatty Potty), or wrapped toilet paper rolls (if having knees above hips helps you empty) °Lean forward from your hips °Maintain the normal inward curve in your lower back ° ° °Repeated Void: °After your initial void is complete, follow these movement patterns and attempt going to the bathroom again. °Stand up °Rotate your hips as if doing hula hoop in one direction °Rotate using the same action in the other direction °Rock your hips and pelvis back and forwards ("pelvic tilts") °Rock your hips and pelvis side to side ("tail wag") °Sit back down and repeat your voiding technique °This technique can be repeated as many times as you choose to help you empty your bladder more effectively. ° ° °Augustus Zurawski, PT °[facility] °[address], [address] °[place], [place] [address] °W: [phone] °[email or web address] ° °

## 2020-07-16 NOTE — Telephone Encounter (Signed)
Requested medication (s) are due for refill today: yes  Requested medication (s) are on the active medication list: historical med  Last refill:  09/08/2018  Future visit scheduled: yes  Notes to clinic:  historical med and provider   Requested Prescriptions  Pending Prescriptions Disp Refills   celecoxib (CELEBREX) 100 MG capsule [Pharmacy Med Name: CELECOXIB 100 MG CAP 100 Capsule] 60 capsule 4    Sig: TAKE ONE CAPSULE (100 MG DOSE) BY MOUTH 2 (TWO) TIMES DAILY.      Analgesics:  COX2 Inhibitors Passed - 07/16/2020  3:42 PM      Passed - HGB in normal range and within 360 days    Hemoglobin  Date Value Ref Range Status  01/15/2020 12.1 12.0 - 15.0 g/dL Final  01/20/2017 12.0 11.1 - 15.9 g/dL Final          Passed - Cr in normal range and within 360 days    Creat  Date Value Ref Range Status  09/22/2016 0.80 0.50 - 1.05 mg/dL Final    Comment:      For patients > or = 62 years of age: The upper reference limit for Creatinine is approximately 13% higher for people identified as African-American.      Creatinine, Ser  Date Value Ref Range Status  12/26/2019 0.76 0.44 - 1.00 mg/dL Final          Passed - Patient is not pregnant      Passed - Valid encounter within last 12 months    Recent Outpatient Visits           3 weeks ago No-show for appointment   [place], [place] J, NP   2 months ago Type 2 diabetes mellitus with morbid obesity ([place])   [place], MD   5 months ago Type 2 diabetes mellitus with morbid obesity [facility])   [place], MD   8 months ago Prediabetes   [place], MD   8 months ago Patient left before evaluation by [place], Deborah B, MD       Future Appointments             In 1 week Althea Charon,  FNP [facility] [place]   In 1 week Melvyn Novas, Christena Deem, MD [facility]   In 1 month Ladell Pier, MD [place]

## 2020-07-19 MED FILL — GABAPENTIN 300 MG CAPSULE: 300 | 30 days supply | Qty: 120 | Fill #2

## 2020-07-19 MED FILL — FAMOTIDINE 20 MG TABS: 20 | 30 days supply | Qty: 60 | Fill #2

## 2020-07-22 NOTE — Patient Instructions (Addendum)
Chronic nonallergic rhinitis with slight dust mite antigen hypersensitivity Continue avoidance measures Start fluticasone nasal spray 2 sprays each nostril once a day as needed for stuffy nose. Continue azelastine nasal spray using 1 to 2 sprays each nostril twice a day Continue sinus rinse as needed.  Use this prior to any medicated nasal sprays. For thick postnasal drip, add guaifenesin 600 to 1200 mg (Mucinex) twice a day as needed.  Make sure to drink plenty of fluids  Dyspnea on exertion Spirometry today and on June 17, 2020 reveals a moderate restrictive pattern without significant response.  This restrictive pattern may be due to body habitus.  Stop Flovent 220 mcg since you could tell no difference with your dyspnea while using.  If symptoms worsen start Flovent 220 mcg 2 puffs twice a day with a spacer Continue montelukast 10 mg once a day to help prevent cough and wheeze May use albuterol 2 puffs every 4-6 hours as needed for cough, wheeze, tightness in chest, or shortness of breath. Schedule follow-up appointment with cardiologist to discuss dyspnea with exertion/pressure on chest.  If pressure in chest worsens or persists proceed to the emergency room.  Cough, persistent Continue gabapentin as prescribed. Continue to follow up with ENT. Continue reflux dietary and lifestyle modifications-taking Nexium as prescribed. Schedule a follow up with GI to discuss reflux symptoms while on Nexium Keep already scheduled follow up appointment next month with Dr. Melvyn Novas

## 2020-07-23 ENCOUNTER — Encounter: Payer: Self-pay | Admitting: Family

## 2020-07-23 ENCOUNTER — Other Ambulatory Visit: Payer: Self-pay

## 2020-07-23 ENCOUNTER — Encounter: Payer: Medicaid Other | Admitting: Physical Therapy

## 2020-07-23 ENCOUNTER — Ambulatory Visit (INDEPENDENT_AMBULATORY_CARE_PROVIDER_SITE_OTHER): Payer: Medicaid Other | Admitting: Family

## 2020-07-23 VITALS — BP 110/82 | HR 64 | Temp 97.0°F | Resp 16

## 2020-07-23 DIAGNOSIS — R0600 Dyspnea, unspecified: Secondary | ICD-10-CM

## 2020-07-23 DIAGNOSIS — R053 Chronic cough: Secondary | ICD-10-CM | POA: Diagnosis not present

## 2020-07-23 DIAGNOSIS — J3089 Other allergic rhinitis: Secondary | ICD-10-CM

## 2020-07-23 DIAGNOSIS — R0609 Other forms of dyspnea: Secondary | ICD-10-CM

## 2020-07-23 NOTE — Progress Notes (Addendum)
[place] [address] Dept: [phone]  FOLLOW UP NOTE  Patient ID: Robin [name], female    DOB: [birth year]  Age: 64 y.o. MRN: [id number] Date of Office Visit: 07/23/2020  Assessment  Chief Complaint: Allergies, Headache, and Shortness of Breath  HPI Robin [name] is a 63 year old female who presents today for follow-up of chronic nonallergic rhinosinusitis with slight dust mite antigen hypersensitivity, dyspnea on exertion,and persistent cough.  She was last seen on June 17, 2020 by Dr. Verlin Fester.  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  She reports nasal congestion, postnasal drip, occasional headaches due to congestion, and itchy watery eyes.  She denies rhinorrhea and sinus tenderness.  She feels that azelastine nasal spray helped some with with the drainage.  Dyspnea on exertion is reported as not changed since starting Flovent 220 mcg 2 puffs twice a day with spacer.  She also continues to take montelukast 10 mg once a day and albuterol as needed.  She also reports wheezing, and tightness in her chest.  She uses her albuterol approximately 3 times a day.  She had a chest x-ray on December 04, 2019 that shows no radiographic evidence of acute cardiopulmonary disease  She continues to take gabapentin as prescribed by ENT for her cough.  Her cough is described as dry and at times productive.  When her cough is productive it is yellow in color.  She reports it has been this color for a few months.  She denies any fever or chills.    Reflux is reported as not controlled with Nexium once a day.  She reports that her her GI doctor has been changing her medications and she feels that it is not controlled with Nexium.  Current medications are as listed in the chart.   Drug Allergies:  Allergies  Allergen Reactions  . Celexa  [Citalopram Hydrobromide] Other (See Comments)    "Made me feel out of it"  . Gabapentin     Contributed to lower extremity edema  . Norvasc [Amlodipine Besylate]     Edema in lower extremities  . Diflucan [Fluconazole] Rash    Review of Systems: Review of Systems  Constitutional: Negative for chills and fever.  HENT:       Reports nasal congestion and post nasal drip  Eyes:       Reports occasional itchy watery eyes  Respiratory: Positive for cough, shortness of breath and wheezing.   Cardiovascular: Negative for chest pain and palpitations.       Reports pressure in her chest  Gastrointestinal: Negative for abdominal pain.       Reports reflux on Nexium  Genitourinary: Negative for dysuria.  Skin: Negative for itching and rash.  Neurological: Positive for headaches.    Physical Exam: BP 110/82   Pulse 64   Temp (!) 97 F (36.1 C) (Tympanic)   Resp 16   SpO2 98%    Physical Exam  Diagnostics: FVC 1.66 L FEV1 1.46 L.  Predicted FVC 3.08 L, FEV1 2.42 L.  Spirometry indicates moderate restriction.  Status post bronchodilator response shows FVC 1.78 L, FEV1 1.50 L.  Spirometry indicates moderate restriction with no significant bronchodilator response.  Assessment and Plan: 1. Dyspnea on exertion   2. Cough, persistent   3. 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   Patient Instructions  Chronic nonallergic rhinitis with slight dust mite antigen hypersensitivity Continue avoidance measures Start fluticasone nasal spray 2 sprays each nostril once a day as needed for stuffy nose. Continue azelastine nasal spray using 1 to 2 sprays each nostril twice a day Continue sinus rinse as needed.  Use this prior to any medicated nasal sprays. For thick postnasal drip, add guaifenesin 600 to 1200 mg (Mucinex) twice a day as needed.  Make sure to drink plenty of  fluids  Dyspnea on exertion Spirometry today and on June 17, 2020 reveals a moderate restrictive pattern without significant postbronchodilator reversibility.  This restrictive pattern may be due to body habitus.  Stop Flovent 220 mcg since you could tell no difference with your dyspnea while using.  If symptoms worsen start Flovent 220 mcg 2 puffs twice a day with a spacer Continue montelukast 10 mg once a day to help prevent cough and wheeze May use albuterol 2 puffs every 4-6 hours as needed for cough, wheeze, tightness in chest, or shortness of breath. Schedule follow-up appointment with cardiologist to discuss dyspnea with exertion/pressure on chest.  If pressure in chest worsens or persists proceed to the emergency room.  Cough, persistent Continue gabapentin as prescribed. Continue to follow up with ENT. Continue reflux dietary and lifestyle modifications-taking Nexium as prescribed. Schedule a follow up with GI to discuss reflux symptoms while on Nexium Keep already scheduled follow up appointment next month with Dr. Melvyn Novas    Return if symptoms worsen or fail to improve.    Thank you for the opportunity to care for this patient.  Please do not hesitate to contact me with questions.  Althea Charon, FNP [facility] [place]  ________________________________________________  I have provided oversight concerning Altha Harm Darrall Strey's evaluation and treatment of this patient's health issues addressed during today's encounter.  I agree with the assessment and therapeutic plan as outlined in the note.   Signed,   R Edgar Frisk, MD

## 2020-07-24 DIAGNOSIS — G8929 Other chronic pain: Secondary | ICD-10-CM | POA: Diagnosis not present

## 2020-07-24 DIAGNOSIS — E119 Type 2 diabetes mellitus without complications: Secondary | ICD-10-CM | POA: Diagnosis not present

## 2020-07-24 DIAGNOSIS — M25561 Pain in right knee: Secondary | ICD-10-CM | POA: Diagnosis not present

## 2020-07-24 DIAGNOSIS — R748 Abnormal levels of other serum enzymes: Secondary | ICD-10-CM | POA: Diagnosis not present

## 2020-07-24 DIAGNOSIS — M25562 Pain in left knee: Secondary | ICD-10-CM | POA: Diagnosis not present

## 2020-07-24 DIAGNOSIS — M129 Arthropathy, unspecified: Secondary | ICD-10-CM | POA: Diagnosis not present

## 2020-07-25 ENCOUNTER — Telehealth: Payer: Self-pay | Admitting: Internal Medicine

## 2020-07-25 ENCOUNTER — Other Ambulatory Visit: Payer: Self-pay | Admitting: Internal Medicine

## 2020-07-25 MED ORDER — CELECOXIB 100 MG PO CAPS
100.0000 mg | ORAL_CAPSULE | Freq: Two times a day (BID) | ORAL | 6 refills | Status: DC
Start: 2020-07-25 — End: 2020-07-25

## 2020-07-25 NOTE — Telephone Encounter (Signed)
Returned pt call and went over Dr. Wynetta Emery message pt states she is taking the celebrex

## 2020-07-25 NOTE — Addendum Note (Signed)
Addended by: Karle Plumber B on: 07/25/2020 09:56 PM   Modules accepted: Orders

## 2020-07-25 NOTE — Telephone Encounter (Signed)
Returned call and made aware that forms were faxed back this morning

## 2020-07-25 NOTE — Telephone Encounter (Signed)
Home delivered called about the physician order for incontinence supplies and a CMN/ please advise

## 2020-07-26 ENCOUNTER — Telehealth: Payer: Self-pay | Admitting: Internal Medicine

## 2020-07-26 ENCOUNTER — Encounter: Payer: Self-pay | Admitting: Podiatry

## 2020-07-26 ENCOUNTER — Other Ambulatory Visit: Payer: Self-pay

## 2020-07-26 ENCOUNTER — Ambulatory Visit (INDEPENDENT_AMBULATORY_CARE_PROVIDER_SITE_OTHER): Payer: Medicaid Other | Admitting: Podiatry

## 2020-07-26 DIAGNOSIS — B351 Tinea unguium: Secondary | ICD-10-CM | POA: Diagnosis not present

## 2020-07-26 DIAGNOSIS — M79675 Pain in left toe(s): Secondary | ICD-10-CM | POA: Diagnosis not present

## 2020-07-26 DIAGNOSIS — M79674 Pain in right toe(s): Secondary | ICD-10-CM | POA: Diagnosis not present

## 2020-07-26 MED FILL — CELECOXIB 100 MG CAP: 100 | 30 days supply | Qty: 60 | Fill #0

## 2020-07-26 NOTE — Telephone Encounter (Signed)
Copied from [place] [id number]. Topic: General - Other >> Jul 24, 2020 11:06 AM Alanda Slim E wrote: Reason for CRM: called about a Document faxed over for incontinence supplies /[place] asked to speak with nurse Daun Peacock advise

## 2020-07-27 ENCOUNTER — Encounter (HOSPITAL_COMMUNITY): Payer: Self-pay | Admitting: Emergency Medicine

## 2020-07-27 ENCOUNTER — Other Ambulatory Visit: Payer: Self-pay

## 2020-07-27 ENCOUNTER — Emergency Department (HOSPITAL_COMMUNITY)
Admission: EM | Admit: 2020-07-27 | Discharge: 2020-07-30 | Disposition: A | Discharge status: Other Acute Inpatient Hospital | Payer: Medicaid Other | Attending: Emergency Medicine | Admitting: Emergency Medicine

## 2020-07-27 DIAGNOSIS — E039 Hypothyroidism, unspecified: Secondary | ICD-10-CM | POA: Diagnosis not present

## 2020-07-27 DIAGNOSIS — Z20822 Contact with and (suspected) exposure to covid-19: Secondary | ICD-10-CM | POA: Insufficient documentation

## 2020-07-27 DIAGNOSIS — F22 Delusional disorders: Secondary | ICD-10-CM | POA: Diagnosis not present

## 2020-07-27 DIAGNOSIS — I10 Essential (primary) hypertension: Secondary | ICD-10-CM | POA: Insufficient documentation

## 2020-07-27 DIAGNOSIS — Z7982 Long term (current) use of aspirin: Secondary | ICD-10-CM | POA: Diagnosis not present

## 2020-07-27 DIAGNOSIS — Z79899 Other long term (current) drug therapy: Secondary | ICD-10-CM | POA: Diagnosis not present

## 2020-07-27 DIAGNOSIS — R45850 Homicidal ideations: Secondary | ICD-10-CM | POA: Insufficient documentation

## 2020-07-27 DIAGNOSIS — E1165 Type 2 diabetes mellitus with hyperglycemia: Secondary | ICD-10-CM | POA: Diagnosis not present

## 2020-07-27 DIAGNOSIS — F333 Major depressive disorder, recurrent, severe with psychotic symptoms: Secondary | ICD-10-CM | POA: Insufficient documentation

## 2020-07-27 DIAGNOSIS — R4182 Altered mental status, unspecified: Secondary | ICD-10-CM | POA: Diagnosis not present

## 2020-07-27 DIAGNOSIS — R079 Chest pain, unspecified: Secondary | ICD-10-CM | POA: Diagnosis not present

## 2020-07-27 DIAGNOSIS — F4325 Adjustment disorder with mixed disturbance of emotions and conduct: Secondary | ICD-10-CM | POA: Diagnosis not present

## 2020-07-27 DIAGNOSIS — E119 Type 2 diabetes mellitus without complications: Secondary | ICD-10-CM | POA: Insufficient documentation

## 2020-07-27 DIAGNOSIS — J45909 Unspecified asthma, uncomplicated: Secondary | ICD-10-CM | POA: Diagnosis not present

## 2020-07-27 DIAGNOSIS — Z87891 Personal history of nicotine dependence: Secondary | ICD-10-CM | POA: Insufficient documentation

## 2020-07-27 DIAGNOSIS — R159 Full incontinence of feces: Secondary | ICD-10-CM | POA: Diagnosis not present

## 2020-07-27 DIAGNOSIS — R3981 Functional urinary incontinence: Secondary | ICD-10-CM | POA: Diagnosis not present

## 2020-07-27 DIAGNOSIS — F600 Paranoid personality disorder: Secondary | ICD-10-CM | POA: Diagnosis not present

## 2020-07-27 DIAGNOSIS — J45991 Cough variant asthma: Secondary | ICD-10-CM | POA: Diagnosis not present

## 2020-07-27 LAB — COMPREHENSIVE METABOLIC PANEL
ALT: 56 U/L — ABNORMAL HIGH (ref 0–44)
AST: 45 U/L — ABNORMAL HIGH (ref 15–41)
Albumin: 4.2 g/dL (ref 3.5–5.0)
Alkaline Phosphatase: 71 U/L (ref 38–126)
Anion gap: 13 (ref 5–15)
BUN: 18 mg/dL (ref 8–23)
CO2: 28 mmol/L (ref 22–32)
Calcium: 10.4 mg/dL — ABNORMAL HIGH (ref 8.9–10.3)
Chloride: 98 mmol/L (ref 98–111)
Creatinine, Ser: 0.91 mg/dL (ref 0.44–1.00)
GFR, Estimated: >60 mL/min (ref >60)
Glucose, Bld: 176 mg/dL — ABNORMAL HIGH (ref 70–99)
Potassium: 3.6 mmol/L (ref 3.5–5.1)
Sodium: 139 mmol/L (ref 135–145)
Total Bilirubin: 1.1 mg/dL (ref 0.3–1.2)
Total Protein: 7.8 g/dL (ref 6.5–8.1)

## 2020-07-27 LAB — CBC
HCT: 40.9 % (ref 36.0–46.0)
Hemoglobin: 12.8 g/dL (ref 12.0–15.0)
MCH: 29.4 pg (ref 26.0–34.0)
MCHC: 31.3 g/dL (ref 30.0–36.0)
MCV: 94 fL (ref 80.0–100.0)
Platelets: 354 10*3/uL (ref 150–400)
RBC: 4.35 MIL/uL (ref 3.87–5.11)
RDW: 12.5 % (ref 11.5–15.5)
WBC: 10.3 10*3/uL (ref 4.0–10.5)
nRBC: 0 % (ref 0.0–0.2)

## 2020-07-27 LAB — ACETAMINOPHEN LEVEL: Acetaminophen (Tylenol), Serum: <10 ug/mL — ABNORMAL LOW (ref 10–30)

## 2020-07-27 LAB — RESPIRATORY PANEL BY RT PCR (FLU A&B, COVID)
Influenza A by PCR: NEGATIVE
Influenza B by PCR: NEGATIVE
SARS Coronavirus 2 by RT PCR: NEGATIVE

## 2020-07-27 LAB — RAPID URINE DRUG SCREEN, HOSP PERFORMED
Amphetamines: NOT DETECTED
Barbiturates: NOT DETECTED
Benzodiazepines: NOT DETECTED
Cocaine: NOT DETECTED
Opiates: NOT DETECTED
Tetrahydrocannabinol: NOT DETECTED

## 2020-07-27 LAB — ETHANOL: Alcohol, Ethyl (B): <10 mg/dL (ref <10)

## 2020-07-27 LAB — SALICYLATE LEVEL: Salicylate Lvl: <7 mg/dL — ABNORMAL LOW (ref 7.0–30.0)

## 2020-07-27 MED ORDER — OXYCODONE-ACETAMINOPHEN 10-325 MG PO TABS: 1.0000 | ORAL_TABLET | Freq: Two times a day (BID) | ORAL | Status: DC

## 2020-07-27 MED ORDER — BUPROPION HCL ER (XL) 150 MG PO TB24
300.0000 mg | ORAL_TABLET | Freq: Every day | ORAL | Status: DC
  Administered 2020-07-27 – 2020-07-30 (×4): 300 mg via ORAL
  Filled 2020-07-27 (×4): qty 2

## 2020-07-27 MED ORDER — GABAPENTIN 300 MG PO CAPS
300.0000 mg | ORAL_CAPSULE | Freq: Four times a day (QID) | ORAL | Status: DC
  Administered 2020-07-27 – 2020-07-30 (×10): 300 mg via ORAL
  Filled 2020-07-27 (×10): qty 1

## 2020-07-27 MED ORDER — MONTELUKAST SODIUM 10 MG PO TABS
10.0000 mg | ORAL_TABLET | Freq: Every day | ORAL | Status: DC
  Administered 2020-07-27 – 2020-07-29 (×2): 10 mg via ORAL
  Filled 2020-07-27 (×3): qty 1

## 2020-07-27 MED ORDER — CALCIUM CARBONATE ANTACID 500 MG PO CHEW
2.0000 | CHEWABLE_TABLET | Freq: Every day | ORAL | Status: DC
  Administered 2020-07-27 – 2020-07-29 (×3): 400 mg via ORAL
  Filled 2020-07-27 (×3): qty 2

## 2020-07-27 MED ORDER — PREGABALIN 25 MG PO CAPS
25.0000 mg | ORAL_CAPSULE | Freq: Two times a day (BID) | ORAL | Status: DC
  Administered 2020-07-27 – 2020-07-30 (×7): 25 mg via ORAL
  Filled 2020-07-27 (×7): qty 1

## 2020-07-27 MED ORDER — ASPIRIN EC 81 MG PO TBEC
81.0000 mg | DELAYED_RELEASE_TABLET | Freq: Every day | ORAL | Status: DC
  Administered 2020-07-27 – 2020-07-29 (×3): 81 mg via ORAL
  Filled 2020-07-27 (×5): qty 1

## 2020-07-27 MED ORDER — ATORVASTATIN CALCIUM 10 MG PO TABS
10.0000 mg | ORAL_TABLET | Freq: Every day | ORAL | Status: DC
  Administered 2020-07-27 – 2020-07-30 (×4): 10 mg via ORAL
  Filled 2020-07-27 (×4): qty 1

## 2020-07-27 MED ORDER — DULAGLUTIDE 0.75 MG/0.5ML ~~LOC~~ SOAJ: 0.7500 mg | SUBCUTANEOUS | Status: DC

## 2020-07-27 MED ORDER — PANTOPRAZOLE SODIUM 40 MG PO TBEC
40.0000 mg | DELAYED_RELEASE_TABLET | Freq: Every day | ORAL | Status: DC
  Administered 2020-07-27 – 2020-07-30 (×4): 40 mg via ORAL
  Filled 2020-07-27 (×4): qty 1

## 2020-07-27 MED ORDER — HYDROCHLOROTHIAZIDE 25 MG PO TABS
25.0000 mg | ORAL_TABLET | Freq: Every day | ORAL | Status: DC
  Administered 2020-07-28 – 2020-07-30 (×3): 25 mg via ORAL
  Filled 2020-07-27 (×3): qty 1

## 2020-07-27 MED ORDER — METOPROLOL TARTRATE 25 MG PO TABS
75.0000 mg | ORAL_TABLET | Freq: Two times a day (BID) | ORAL | Status: DC
  Administered 2020-07-27 – 2020-07-30 (×6): 75 mg via ORAL
  Filled 2020-07-27 (×6): qty 3

## 2020-07-27 MED ORDER — ALBUTEROL SULFATE HFA 108 (90 BASE) MCG/ACT IN AERS: 1.0000 | INHALATION_SPRAY | Freq: Four times a day (QID) | RESPIRATORY_TRACT | Status: DC | PRN

## 2020-07-27 MED ORDER — METFORMIN HCL 500 MG PO TABS
1000.0000 mg | ORAL_TABLET | Freq: Two times a day (BID) | ORAL | Status: DC
  Administered 2020-07-27 – 2020-07-30 (×7): 1000 mg via ORAL
  Filled 2020-07-27 (×8): qty 2

## 2020-07-27 MED ORDER — ACETAMINOPHEN 500 MG PO TABS
1000.0000 mg | ORAL_TABLET | Freq: Two times a day (BID) | ORAL | Status: DC
  Administered 2020-07-27 – 2020-07-30 (×7): 1000 mg via ORAL
  Filled 2020-07-27 (×7): qty 2

## 2020-07-27 MED ORDER — SPIRONOLACTONE 25 MG PO TABS
25.0000 mg | ORAL_TABLET | Freq: Every day | ORAL | Status: DC
  Administered 2020-07-27 – 2020-07-30 (×4): 25 mg via ORAL
  Filled 2020-07-27 (×4): qty 1

## 2020-07-27 MED ORDER — CELECOXIB 100 MG PO CAPS
100.0000 mg | ORAL_CAPSULE | Freq: Two times a day (BID) | ORAL | Status: DC
  Administered 2020-07-27 – 2020-07-30 (×5): 100 mg via ORAL
  Filled 2020-07-27 (×7): qty 1

## 2020-07-27 MED ORDER — ATORVASTATIN CALCIUM 40 MG PO TABS
40.0000 mg | ORAL_TABLET | Freq: Every day | ORAL | Status: DC
  Administered 2020-07-27 – 2020-07-30 (×4): 40 mg via ORAL
  Filled 2020-07-27 (×4): qty 1

## 2020-07-27 MED ORDER — DULOXETINE HCL 30 MG PO CPEP
30.0000 mg | ORAL_CAPSULE | Freq: Every day | ORAL | Status: DC
  Administered 2020-07-27 – 2020-07-30 (×4): 30 mg via ORAL
  Filled 2020-07-27 (×4): qty 1

## 2020-07-27 MED ORDER — AMITRIPTYLINE HCL 25 MG PO TABS
50.0000 mg | ORAL_TABLET | Freq: Every day | ORAL | Status: DC
  Administered 2020-07-27 – 2020-07-29 (×3): 50 mg via ORAL
  Filled 2020-07-27 (×3): qty 2

## 2020-07-27 MED ORDER — LORATADINE 10 MG PO TABS
10.0000 mg | ORAL_TABLET | Freq: Every day | ORAL | Status: DC
  Administered 2020-07-27 – 2020-07-30 (×4): 10 mg via ORAL
  Filled 2020-07-27 (×4): qty 1

## 2020-07-27 MED ORDER — LEVOTHYROXINE SODIUM 88 MCG PO TABS
88.0000 ug | ORAL_TABLET | Freq: Every day | ORAL | Status: DC
  Administered 2020-07-27 – 2020-07-30 (×4): 88 ug via ORAL
  Filled 2020-07-27 (×4): qty 1

## 2020-07-27 MED ORDER — LINACLOTIDE 145 MCG PO CAPS
290.0000 ug | ORAL_CAPSULE | Freq: Every day | ORAL | Status: DC
  Administered 2020-07-27 – 2020-07-29 (×3): 290 ug via ORAL
  Filled 2020-07-27 (×4): qty 2

## 2020-07-27 MED ORDER — HYDRALAZINE HCL 25 MG PO TABS
50.0000 mg | ORAL_TABLET | Freq: Three times a day (TID) | ORAL | Status: DC
  Administered 2020-07-27 – 2020-07-30 (×9): 50 mg via ORAL
  Filled 2020-07-27 (×9): qty 2

## 2020-07-27 MED ORDER — FUROSEMIDE 20 MG PO TABS
20.0000 mg | ORAL_TABLET | Freq: Every day | ORAL | Status: DC
  Administered 2020-07-27 – 2020-07-30 (×4): 20 mg via ORAL
  Filled 2020-07-27 (×4): qty 1

## 2020-07-27 MED ORDER — OXYCODONE-ACETAMINOPHEN 5-325 MG PO TABS
1.0000 | ORAL_TABLET | Freq: Two times a day (BID) | ORAL | Status: DC
  Administered 2020-07-27 – 2020-07-30 (×6): 1 via ORAL
  Filled 2020-07-27 (×6): qty 1

## 2020-07-27 MED ORDER — VALACYCLOVIR HCL 500 MG PO TABS
500.0000 mg | ORAL_TABLET | Freq: Two times a day (BID) | ORAL | Status: DC
  Administered 2020-07-27 – 2020-07-30 (×7): 500 mg via ORAL
  Filled 2020-07-27 (×7): qty 1

## 2020-07-27 MED ORDER — OXYCODONE HCL 5 MG PO TABS
5.0000 mg | ORAL_TABLET | Freq: Two times a day (BID) | ORAL | Status: DC
  Administered 2020-07-27 – 2020-07-30 (×6): 5 mg via ORAL
  Filled 2020-07-27 (×6): qty 1

## 2020-07-27 MED ORDER — LOSARTAN POTASSIUM 25 MG PO TABS
25.0000 mg | ORAL_TABLET | Freq: Every day | ORAL | Status: DC
  Administered 2020-07-27 – 2020-07-30 (×4): 25 mg via ORAL
  Filled 2020-07-27 (×5): qty 1
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
## 2020-07-27 NOTE — ED Notes (Signed)
PT BELONGINGS LOCATED IN [address]
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
## 2020-07-27 NOTE — ED Notes (Signed)
Pt uses [employer] for transfers but said that she wants to send that home with her husband and can use a walker or cane here to ambulate to the bathroom.  She is calm and cooperative, pleasant on admission to the TCU. States that she has been getting more and more depressed.

## 2020-07-27 NOTE — ED Notes (Signed)
Patient wanded by security. 

## 2020-07-28 DIAGNOSIS — F4325 Adjustment disorder with mixed disturbance of emotions and conduct: Secondary | ICD-10-CM

## 2020-07-28 HISTORY — DX: Adjustment disorder with mixed disturbance of emotions and conduct: F43.25

## 2020-07-28 NOTE — Consult Note (Addendum)
[facility] Psych ED Progress Note  07/28/2020 1:52 PM Robin [name]  MRN:  [id number] Subjective:  "I'm not feeling that good today.  Patient seen via telepsych by this provider; chart reviewed and consulted with Dr. Darleene Cleaver on 07/28/20.  On evaluation Robin [name] reports, " I am not feeling that good today.  My husband is cheating on me and has decided he wants to stay with the other woman. I want to hurt him because of this"  She is sitting at the bedside with her legs dangled to the bedside table, eating breakfast.  She looks sad, despondent but is polite and copperative.  She denies suicidal ideations but continues to endorse homicidal ideations with a plan to hurt her husband.     Principal Problem: Adjustment disorder with mixed disturbance of emotions and conduct Diagnosis:  Principal Problem:   Adjustment disorder with mixed disturbance of emotions and conduct  Total Time spent with patient: 30 minutes  Past Psychiatric History:   Past Medical History:  Past Medical History:  Diagnosis Date  . Anxiety disorder   . Arthritis   . Asthma   . Chronic neck pain   . Constipation   . Depression   . Diabetes mellitus without complication ([place])   . Dyspnea   . GERD (gastroesophageal reflux disease)   . Hiatal hernia    small  . Hyperlipidemia   . Hypertension   . Hypothyroidism   . Morbid obesity with BMI of 50.0-59.9, adult ([place])   . Schatzki's ring    non critical  . Sinusitis   . Sleep apnea    CPAP, Sleep study at [place]  . Sleep apnea     Past Surgical History:  Procedure Laterality Date  . ABDOMINAL HYSTERECTOMY    . ABDOMINAL HYSTERECTOMY  02/18/2000  . CARDIAC CATHETERIZATION  08/18/2003   normal L main/LAD/L Cfx/RCA (Dr. Adora Fridge)  . COLONOSCOPY  2006   Dr. [name], hyperplastic polyps  . COLONOSCOPY  08/06/11   abnormal terminal ileum for 10cm, erosions, geographical ulceration. Bx small bowel mucosa with prominent intramucosal lymphoid  aggregates, slightly inflammed  . COLONOSCOPY WITH PROPOFOL N/A 12/28/2019   Rourk: 4 sessile serrated adenomas removed on the colon.  Next colonoscopy in 3 years.  Marland Kitchen DIRECT LARYNGOSCOPY  03/15/2012   Procedure: DIRECT LARYNGOSCOPY;  Surgeon: [name], MD;  Location: [place];  Service: ENT;  Laterality: N/A; Dr. Wolicki--:>no foreign body seen. normal esophagus to 40cm  . ESOPHAGEAL DILATION  04/17/2015   Procedure: ESOPHAGEAL DILATION;  Surgeon: [name], MD;  [facility];  Service: Endoscopy;;  . ESOPHAGOGASTRODUODENOSCOPY  08/23/2008   VPX:TGGY distal esophageal erosions consistent with mild erosive reflux esophagitis, otherwise unremarkable esophagus/ Tiny antral erosions of doubtful clinical significance, otherwise normal stomach, patent pylorus, normal D1 and D2  . ESOPHAGOGASTRODUODENOSCOPY  08/06/11   small hh, noncritical Schatzki's ring s/p 12 F  . ESOPHAGOGASTRODUODENOSCOPY N/A 04/17/2015   IRS:WNIOEV s/p dilation  . ESOPHAGOGASTRODUODENOSCOPY (EGD) WITH PROPOFOL N/A 01/20/2018   Dr. [name]: Erosive gastropathy, normal-appearing esophagus status post empiric dilation.  Chronic gastritis, no H. pylori.  . ESOPHAGOSCOPY  03/15/2012   Procedure: ESOPHAGOSCOPY;  Surgeon: [name], MD;  Location: [place];  Service: ENT;  Laterality: N/A;  . KNEE ARTHROSCOPY  04/27/2001  . MALONEY DILATION N/A 01/20/2018   Procedure: Venia Minks DILATION;  Surgeon: [name], MD;  [facility];  Service: Endoscopy;  Laterality: N/A;  . NM  MYOCAR PERF WALL MOTION  2003   negative bruce protocol exercise tress test; EF 68%; intermediate risk study due to evidence of anterior wall ischemia extending from mid-ventricle to apex  . PITUITARY SURGERY  06/2012   benign tumor, surgeon at [facility]  . POLYPECTOMY  12/28/2019   Procedure: POLYPECTOMY;  Surgeon: [name], MD;  [facility];  Service: Endoscopy;;  . RECTOCELE REPAIR    . TRANSTHORACIC ECHOCARDIOGRAM  2003    EF normal  . VAGINAL PROLAPSE REPAIR    . VIDEO BRONCHOSCOPY Bilateral 03/08/2017   Procedure: VIDEO BRONCHOSCOPY WITHOUT FLUORO;  Surgeon: Javier Glazier, MD;  Location: [place];  Service: Cardiopulmonary;  Laterality: Bilateral;   Family History:  Family History  Problem Relation Age of Onset  . Kidney cancer Father   . Hypertension Father   . Hyperlipidemia Father   . Sleep apnea Father   . Diabetes Mother   . Hypertension Mother   . Heart Problems Mother   . Hyperlipidemia Mother   . Asthma Mother   . Sleep apnea Mother   . Liver disease Brother   . Arthritis Brother   . Hypertension Sister   . Allergic rhinitis Sister   . Stomach cancer Other        aunt  . Breast cancer Maternal Grandmother   . Kidney disease Maternal Grandfather   . Breast cancer Paternal Grandmother   . Hypertension Brother   . Hyperlipidemia Brother   . Hypertension Brother   . Hypertension Sister   . Hyperlipidemia Sister   . Lupus Maternal Aunt   . Eczema Daughter   . Colon cancer Neg Hx   . Immunodeficiency Neg Hx   . Urticaria Neg Hx    Family Psychiatric  History:denies Social History:  Social History   Substance and Sexual Activity  Alcohol Use No  . Alcohol/week: 0.0 standard drinks     Social History   Substance and Sexual Activity  Drug Use No    Social History   Socioeconomic History  . Marital status: Married    Spouse name: Not on file  . Number of children: 4  . Years of education: 10  . Highest education level: Not on file  Occupational History    Employer: UNEMPLOYED  Tobacco Use  . Smoking status: Former Smoker    Packs/day: 0.50    Years: 10.00    Pack years: 5.00    Types: Cigarettes    Start date: 07/14/1975    Quit date: 04/04/1993    Years since quitting: 27.3  . Smokeless tobacco: Never Used  . Tobacco comment: 17 yrs ago  Vaping Use  . Vaping Use: Never used  Substance and Sexual Activity  . Alcohol use: No    Alcohol/week: 0.0 standard  drinks  . Drug use: No  . Sexual activity: Yes  Other Topics Concern  . Not on file  Social History Narrative   Lives with husband in an apartment on the first floor.  Has 4 children.     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  Trying to get disability.  Formerly worked as a Teacher, early years/pre.  Education: 11th grade.      [facility] (12/23/16):   Originally from [place] She was raised in [place]. 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 No pets currently. No bird exposure. She does have mold in her current home in the bathroom, laundry room, & master bedroom.    Social Determinants of Health   Financial Resource Strain:   . Difficulty of Paying Living Expenses: Not on file  Food Insecurity:   . Worried About Charity fundraiser in the Last Year: Not on file  . Ran Out of Food in the Last Year: Not on file  Transportation Needs: No Transportation Needs  . Lack of Transportation (Medical): No  . Lack of Transportation (Non-Medical): No  Physical Activity:   . Days of Exercise per Week: Not on file  . Minutes of Exercise per Session: Not on file  Stress:   . Feeling of Stress : Not on file  Social Connections:   . Frequency of Communication with Friends and Family: Not on file  . Frequency of Social Gatherings with Friends and Family: Not on file  . Attends Religious Services: Not on file  . Active Member of Clubs or Organizations: Not on file  . Attends Archivist Meetings: Not on file  . Marital Status: Not on file    Sleep: Good  Appetite:  Good  Current Medications: Current Facility-Administered Medications  Medication Dose Route Frequency Provider Last Rate Last Admin  . acetaminophen (TYLENOL) tablet 1,000 mg  1,000 mg Oral BID Daleen Bo, MD   1,000 mg at 07/28/20 1004  . albuterol (VENTOLIN HFA)  108 (90 Base) MCG/ACT inhaler 1-2 puff  1-2 puff Inhalation Q6H PRN Daleen Bo, MD      . amitriptyline (ELAVIL) tablet 50 mg  50 mg Oral QHS Daleen Bo, MD   50 mg at 07/27/20 2234  . aspirin EC tablet 81 mg  81 mg Oral QHS Daleen Bo, MD   81 mg at 07/27/20 2235  . atorvastatin (LIPITOR) tablet 10 mg  10 mg Oral Daily Daleen Bo, MD   10 mg at 07/28/20 1003  . atorvastatin (LIPITOR) tablet 40 mg  40 mg Oral Daily Daleen Bo, MD   40 mg at 07/28/20 1018  . buPROPion (WELLBUTRIN XL) 24 hr tablet 300 mg  300 mg Oral Daily Daleen Bo, MD   300 mg at 07/28/20 1006  . calcium carbonate (TUMS - dosed in mg elemental calcium) chewable tablet 400 mg of elemental calcium  2 tablet Oral QHS Daleen Bo, MD   400 mg of elemental calcium at 07/27/20 2233  . celecoxib (CELEBREX) capsule 100 mg  100 mg Oral BID Daleen Bo, MD   100 mg at 07/28/20 1019  . DULoxetine (CYMBALTA) DR capsule 30 mg  30 mg Oral Daily Daleen Bo, MD   30 mg at 07/28/20 1016  . furosemide (LASIX) tablet 20 mg  20 mg Oral Daily Daleen Bo, MD   20 mg at 07/28/20 1019  . gabapentin (NEURONTIN) capsule 300 mg  300 mg Oral QID Daleen Bo, MD   300 mg at 07/28/20 1006  . hydrALAZINE (APRESOLINE) tablet 50 mg  50 mg Oral TID Daleen Bo, MD   50 mg at 07/28/20 1005  . hydrochlorothiazide (HYDRODIURIL) tablet 25 mg  25 mg Oral Daily Daleen Bo, MD   25 mg at 07/28/20 1007  . levothyroxine (SYNTHROID) tablet 88 mcg  88 mcg Oral Daily Daleen Bo, MD   88 mcg at 07/28/20 1003  . linaclotide (LINZESS) capsule 290 mcg  290 mcg Oral Daily Daleen Bo, MD   290 mcg at 07/28/20  1020  . loratadine (CLARITIN) tablet 10 mg  10 mg Oral Daily Daleen Bo, MD   10 mg at 07/28/20 1006  . losartan (COZAAR) tablet 25 mg  25 mg Oral Daily Daleen Bo, MD   25 mg at 07/28/20 1016  . metFORMIN (GLUCOPHAGE) tablet 1,000 mg  1,000 mg Oral BID Daleen Bo, MD   1,000 mg at 07/28/20 1003  . metoprolol  tartrate (LOPRESSOR) tablet 75 mg  75 mg Oral BID Daleen Bo, MD   75 mg at 07/28/20 1004  . montelukast (SINGULAIR) tablet 10 mg  10 mg Oral QHS Daleen Bo, MD   10 mg at 07/27/20 2235  . oxyCODONE-acetaminophen (PERCOCET/ROXICET) 5-325 MG per tablet 1 tablet  1 tablet Oral BID Daleen Bo, MD   1 tablet at 07/28/20 1005   And  . oxyCODONE (Oxy IR/ROXICODONE) immediate release tablet 5 mg  5 mg Oral BID Daleen Bo, MD   5 mg at 07/28/20 1004  . pantoprazole (PROTONIX) EC tablet 40 mg  40 mg Oral Daily Daleen Bo, MD   40 mg at 07/28/20 1006  . pregabalin (LYRICA) capsule 25 mg  25 mg Oral BID Daleen Bo, MD   25 mg at 07/28/20 1005  . spironolactone (ALDACTONE) tablet 25 mg  25 mg Oral Daily Daleen Bo, MD   25 mg at 07/28/20 1019  . valACYclovir (VALTREX) tablet 500 mg  500 mg Oral BID Daleen Bo, MD   500 mg at 07/28/20 1005   Current Outpatient Medications  Medication Sig Dispense Refill  . acetaminophen (TYLENOL) 500 MG tablet Take 1,000 mg by mouth 2 (two) times daily.     Marland Kitchen albuterol (VENTOLIN HFA) 108 (90 Base) MCG/ACT inhaler Inhale 1-2 puffs into the lungs every 6 (six) hours as needed for wheezing or shortness of breath. 18 g 0  . amitriptyline (ELAVIL) 50 MG tablet TAKE 1 TABLET BY MOUTH AT BEDTIME. (Patient taking differently: Take 50 mg by mouth at bedtime. ) 90 tablet 0  . aspirin EC 81 MG tablet Take 81 mg by mouth at bedtime.    Marland Kitchen atorvastatin (LIPITOR) 40 MG tablet Take 40 mg by mouth daily.    Marland Kitchen azelastine (ASTELIN) 0.1 % nasal spray 1-2 sprays per nostril 2 times daily as needed (Patient taking differently: Place 1 spray into both nostrils 2 (two) times daily. ) 30 mL 12  . buPROPion (WELLBUTRIN XL) 300 MG 24 hr tablet TAKE 1 TABLET (300 MG TOTAL) BY MOUTH EVERY MORNING. (Patient taking differently: Take 300 mg by mouth daily. ) 90 tablet 2  . CALCIUM CARBONATE ANTACID PO Take 2 tablets by mouth See admin instructions. Take 2 tablets by mouth  every morning, may also take 2 tablets at night as needed for acid reflux    . celecoxib (CELEBREX) 100 MG capsule Take 1 capsule (100 mg total) by mouth 2 (two) times daily. 60 capsule 6  . CINNAMON PO Take 1 capsule by mouth daily.    Marland Kitchen conjugated estrogens (PREMARIN) vaginal cream Place 1 Applicatorful vaginally daily as needed (vaginal dryness/itching).    Marland Kitchen diclofenac sodium (VOLTAREN) 1 % GEL Apply 2-4 g topically 4 (four) times daily as needed (pain).     . DULoxetine (CYMBALTA) 30 MG capsule TAKE 1 CAPSULE (30 MG TOTAL) BY MOUTH DAILY. 30 capsule 6  . esomeprazole (NEXIUM) 40 MG capsule Take 1 capsule (40 mg total) by mouth 2 (two) times daily before a meal. (Patient taking differently: Take 40 mg by mouth  daily. ) 60 capsule 5  . famotidine (PEPCID) 20 MG tablet Take 1 tablet (20 mg total) by mouth 2 (two) times daily as needed for heartburn or indigestion. (Patient taking differently: Take 20 mg by mouth 2 (two) times daily. ) 60 tablet 3  . fluticasone (FLONASE) 50 MCG/ACT nasal spray Place 1 spray into both nostrils daily. (Patient taking differently: Place 1 spray into both nostrils daily as needed for allergies or rhinitis. ) 16 g 1  . fluticasone (FLOVENT HFA) 220 MCG/ACT inhaler 2 inhalations twice daily (Patient taking differently: Inhale 2 puffs into the lungs 2 (two) times daily as needed (shortness of breath/wheezing). ) 1 each 12  . furosemide (LASIX) 40 MG tablet TAKE 1/2 TO 1 TABLET BY MOUTH DAILY FOR LOWER EXTREMITY SWELLING (Patient taking differently: Take 20 mg by mouth daily. ) 90 tablet 2  . gabapentin (NEURONTIN) 300 MG capsule TAKE 1 CAPSULE (300 MG TOTAL) BY MOUTH 4 (FOUR) TIMES DAILY. (Patient taking differently: Take 600 mg by mouth 2 (two) times daily. ) 120 capsule 2  . hydrALAZINE (APRESOLINE) 50 MG tablet Take 1 tablet (50 mg total) by mouth 3 (three) times daily. (Patient taking differently: Take 50 mg by mouth 2 (two) times daily. ) 270 tablet 1  . levothyroxine  (SYNTHROID) 88 MCG tablet TAKE 1 TABLET (88 MCG TOTAL) BY MOUTH DAILY. 30 tablet 2  . LINZESS 290 MCG CAPS capsule TAKE 1 CAPSULE BY MOUTH DAILY BEFORE BREAKFAST. (Patient taking differently: Take 290 mcg by mouth daily. ) 90 capsule 3  . losartan (COZAAR) 25 MG tablet Take 25 mg by mouth daily.    . metFORMIN (GLUCOPHAGE) 1000 MG tablet Take 1,000 mg by mouth 2 (two) times daily.    . Metoprolol Tartrate 75 MG TABS TAKE 1 TABLET BY MOUTH TWICE DAILY. (Patient taking differently: Take 75 mg by mouth 2 (two) times daily. ) 180 tablet 2  . Multiple Vitamin (MULTIVITAMIN WITH MINERALS) TABS tablet Take 1 tablet by mouth daily.     . Omega-3 Fatty Acids (FISH OIL) 1000 MG CAPS Take 1,000 mg by mouth at bedtime.     . ondansetron (ZOFRAN) 4 MG tablet Take 4 mg by mouth every 8 (eight) hours as needed for nausea or vomiting.     Marland Kitchen OVER THE COUNTER MEDICATION Take 1 capsule by mouth daily. BEET ROOT     . OVER THE COUNTER MEDICATION Take 2 tablets by mouth 2 (two) times daily. OTC allergy pill    . tiZANidine (ZANAFLEX) 4 MG tablet Take 4 mg by mouth 2 (two) times daily as needed for muscle spasms.    . TRULICITY 0.34 VQ/2.5ZD SOPN Inject 0.75 mg into the skin once a week.     . vitamin B-12 (CYANOCOBALAMIN) 1000 MCG tablet Take 1,000 mcg by mouth daily.    . Vitamin D, Ergocalciferol, (DRISDOL) 1.25 MG (50000 UNIT) CAPS capsule Take 50,000 Units by mouth once a week.    Marland Kitchen ACCU-CHEK AVIVA PLUS test strip USE AS DIRECTED 100 each 12  . ACCU-CHEK AVIVA PLUS test strip USE AS INSTRUCTED 100 strip 12  . atorvastatin (LIPITOR) 20 MG tablet Take 0.5 tablets (10 mg total) by mouth daily. (Patient not taking: Reported on 07/27/2020) 90 tablet 3  . cetirizine (ZYRTEC) 10 MG tablet Take 1 tablet (10 mg total) by mouth daily. (Patient not taking: Reported on 07/27/2020) 30 tablet 1  . glucose blood test strip USE AS INSTRUCTED    . hydrochlorothiazide (HYDRODIURIL) 25 MG  tablet Take 25 mg by mouth daily.    .  metFORMIN (GLUCOPHAGE) 500 MG tablet Take 1 tablet (500 mg total) by mouth daily with breakfast. (Patient not taking: Reported on 07/27/2020) 30 tablet 6  . montelukast (SINGULAIR) 10 MG tablet TAKE 1 TABLET BY MOUTH AT BEDTIME. (Patient not taking: Reported on 07/27/2020) 90 tablet 2  . oxyCODONE-acetaminophen (PERCOCET) 10-325 MG tablet Take 1 tablet by mouth 2 (two) times daily.  (Patient not taking: Reported on 07/27/2020)    . pregabalin (LYRICA) 25 MG capsule Take 25 mg by mouth 2 (two) times daily. (Patient not taking: Reported on 07/27/2020)    . spironolactone (ALDACTONE) 25 MG tablet Take 1 tablet (25 mg total) by mouth daily. 30 tablet 10  . valACYclovir (VALTREX) 500 MG tablet Take 500 mg by mouth 2 (two) times daily. (Patient not taking: Reported on 07/27/2020)      Lab Results:  Results for orders placed or performed during the hospital encounter of 07/27/20 (from the past 48 hour(s))  Comprehensive metabolic panel     Status: Abnormal   Collection Time: 07/27/20 12:40 PM  Result Value Ref Range   Sodium 139 135 - 145 mmol/L   Potassium 3.6 3.5 - 5.1 mmol/L   Chloride 98 98 - 111 mmol/L   CO2 28 22 - 32 mmol/L   Glucose, Bld 176 (H) 70 - 99 mg/dL    Comment: Glucose reference range applies only to samples taken after fasting for at least 8 hours.   BUN 18 8 - 23 mg/dL   Creatinine, Ser 0.91 0.44 - 1.00 mg/dL   Calcium 10.4 (H) 8.9 - 10.3 mg/dL   Total Protein 7.8 6.5 - 8.1 g/dL   Albumin 4.2 3.5 - 5.0 g/dL   AST 45 (H) 15 - 41 U/L   ALT 56 (H) 0 - 44 U/L   Alkaline Phosphatase 71 38 - 126 U/L   Total Bilirubin 1.1 0.3 - 1.2 mg/dL   GFR, Estimated >60 >60 mL/min    Comment: (NOTE) Calculated using the CKD-EPI Creatinine Equation (2021)    Anion gap 13 5 - 15    Comment: Performed at [facility], [place] [address], [place], [place] [address]  Ethanol     Status: None   Collection Time: 07/27/20 12:40 PM  Result Value Ref Range   Alcohol, Ethyl (B) <10  <10 mg/dL    Comment: (NOTE) Lowest detectable limit for serum alcohol is 10 mg/dL.  For medical purposes only. Performed at [facility], [place] [address], [place], [place] [address]   Salicylate level     Status: Abnormal   Collection Time: 07/27/20 12:40 PM  Result Value Ref Range   Salicylate Lvl <2.9 (L) 7.0 - 30.0 mg/dL    Comment: Performed at [facility], [place] [address], [place], [place] [address]  Acetaminophen level     Status: Abnormal   Collection Time: 07/27/20 12:40 PM  Result Value Ref Range   Acetaminophen (Tylenol), Serum <10 (L) 10 - 30 ug/mL    Comment: (NOTE) Therapeutic concentrations vary significantly. A range of 10-30 ug/mL  may be an effective concentration for many patients. However, some  are best treated at concentrations outside of this range. Acetaminophen concentrations >150 ug/mL at 4 hours after ingestion  and >50 ug/mL at 12 hours after ingestion are often associated with  toxic reactions.  Performed at [facility], [place] [address], [place], [place] [address]  cbc     Status: None   Collection Time: 07/27/20 12:40 PM  Result Value Ref Range   WBC 10.3 4.0 - 10.5 K/uL   RBC 4.35 3.87 - 5.11 MIL/uL   Hemoglobin 12.8 12.0 - 15.0 g/dL   HCT 40.9 36 - 46 %   MCV 94.0 80.0 - 100.0 fL   MCH 29.4 26.0 - 34.0 pg   MCHC 31.3 30.0 - 36.0 g/dL   RDW 12.5 11.5 - 15.5 %   Platelets 354 150 - 400 K/uL   nRBC 0.0 0.0 - 0.2 %    Comment: Performed at [facility], [place] [address] [place], [place] [address]  Rapid urine drug screen (hospital performed)     Status: None   Collection Time: 07/27/20  1:17 PM  Result Value Ref Range   Opiates NONE DETECTED NONE DETECTED   Cocaine NONE DETECTED NONE DETECTED   Benzodiazepines NONE DETECTED NONE DETECTED   Amphetamines NONE DETECTED NONE DETECTED   Tetrahydrocannabinol NONE DETECTED NONE DETECTED   Barbiturates NONE DETECTED NONE  DETECTED    Comment: (NOTE) DRUG SCREEN FOR MEDICAL PURPOSES ONLY.  IF CONFIRMATION IS NEEDED FOR ANY PURPOSE, NOTIFY LAB WITHIN 5 DAYS.  LOWEST DETECTABLE LIMITS FOR URINE DRUG SCREEN Drug Class                     Cutoff (ng/mL) Amphetamine and metabolites    1000 Barbiturate and metabolites    200 Benzodiazepine                 016 Tricyclics and metabolites     300 Opiates and metabolites        300 Cocaine and metabolites        300 THC                            50 Performed at [facility], [place] [address] [place],  [address]   Respiratory Panel by RT PCR (Flu A&B, Covid) - Nasopharyngeal Swab     Status: None   Collection Time: 07/27/20  6:00 PM   Specimen: Nasopharyngeal Swab  Result Value Ref Range   SARS Coronavirus 2 by RT PCR NEGATIVE NEGATIVE    Comment: (NOTE) SARS-CoV-2 target nucleic acids are NOT DETECTED.  The SARS-CoV-2 RNA is generally detectable in upper respiratoy specimens during the acute phase of infection. The lowest concentration of SARS-CoV-2 viral copies this assay can detect is 131 copies/mL. A negative result does not preclude SARS-Cov-2 infection and should not be used as the sole basis for treatment or other patient management decisions. A negative result may occur with  improper specimen collection/handling, submission of specimen other than nasopharyngeal swab, presence of viral mutation(s) within the areas targeted by this assay, and inadequate number of viral copies (<131 copies/mL). A negative result must be combined with clinical observations, patient history, and epidemiological information. The expected result is Negative.  Fact Sheet for Patients:  PinkCheek.be  Fact Sheet for Healthcare Providers:  GravelBags.it  This test is no t yet approved or cleared by the [place] FDA and  has been authorized for detection and/or diagnosis of  SARS-CoV-2 by FDA under an Emergency Use Authorization (EUA). This EUA will remain  in effect (meaning this test can be used) for the duration of the COVID-19 declaration under Section 564(b)(1) of the Act, 21 U.S.C. section 360bbb-3(b)(1), unless the authorization is terminated or  revoked sooner.     Influenza A by PCR NEGATIVE NEGATIVE   Influenza B by PCR NEGATIVE NEGATIVE    Comment: (NOTE) The Xpert Xpress SARS-CoV-2/FLU/RSV assay is intended as an aid in  the diagnosis of influenza from Nasopharyngeal swab specimens and  should not be used as a sole basis for treatment. Nasal washings and  aspirates are unacceptable for Xpert Xpress SARS-CoV-2/FLU/RSV  testing.  Fact Sheet for Patients: PinkCheek.be  Fact Sheet for Healthcare Providers: GravelBags.it  This test is not yet approved or cleared by the [place] FDA and  has been authorized for detection and/or diagnosis of SARS-CoV-2 by  FDA under an Emergency Use Authorization (EUA). This EUA will remain  in effect (meaning this test can be used) for the duration of the  Covid-19 declaration under Section 564(b)(1) of the Act, 21  U.S.C. section 360bbb-3(b)(1), unless the authorization is  terminated or revoked. Performed at [facility], [place] [address] [place], [place] [address]     Blood Alcohol level:  Lab Results  Component Value Date   ETH <10 07/27/2020    Physical Findings: AIMS:  , ,  ,  ,    CIWA:    COWS:     Musculoskeletal: Unable to assess via telepsych  Psychiatric Specialty Exam: Physical Exam Musculoskeletal:     Cervical back: Normal range of motion.  Neurological:     Mental Status: She is alert and oriented to person, place, and time.     Review of Systems  Psychiatric/Behavioral: Positive for dysphoric mood. Negative for hallucinations, self-injury and suicidal ideas. The patient is not hyperactive.      Blood pressure (!) 144/66, pulse 79, temperature 98.2 F (36.8 C), temperature source Oral, resp. rate 14, height 5\' 8"  (1.727 m), weight (!) 155.6 kg, SpO2 98 %.Body mass index is 52.15 kg/m.  General Appearance: Casual  Eye Contact:  Good  Speech:  Clear and Coherent and Slow  Volume:  Decreased  Mood:  Depressed and Dysphoric  Affect:  Congruent and Depressed  Thought Process:  Coherent and Descriptions of Associations: Intact  Orientation:  Full (Time, Place, and Person)  Thought Content:  Illogical and as patient endorses HI towards her husband  Suicidal Thoughts:  No  Homicidal Thoughts:  Yes.  with intent/plan  Memory:  Immediate;   Good Recent;   Good Remote;   Good  Judgement:  Impaired  Insight:  Fair  Psychomotor Activity:  NA  Concentration:  Concentration: Good and Attention Span: Good  Recall:  Good  Fund of Knowledge:  Good  Language:  Good  Akathisia:  NA  Handed:  Right  AIMS (if indicated):     Assets:  Communication Skills Desire for Improvement Housing  ADL's:  Intact  Cognition:  WNL  Sleep:   < 6 hours    Treatment Plan Summary: Daily contact with patient to assess and evaluate symptoms and progress in treatment and Medication management.  She has multiple medical comorbidity she would benefit from geriatric psych placement for mood stabilization.  She continues to take po psychotropic medications, remains calm and cooperative with staff.    Patient continues to meet criteria for geriatric inpatient psychiatric admission.  SW has continues to fax her information out to locate accepting facilities. This information was discussed with the patient who voluntarily accepts inpatient admission and requests to stay in [place] area citing her local support.  Discussed, preference is given to the first accepting facility regardless of location.  Mallie Darting, NP 07/28/2020, 1:52 PM  Patient seen via tele health for psychiatric evaluation, chart reviewed  and case discussed with the physician extender and developed treatment plan. Reviewed the information documented and agree with the treatment plan. Corena Pilgrim, MD

## 2020-07-28 NOTE — Progress Notes (Signed)
Subjective:  Patient ID: Robin [name], female    DOB: Jul 31, 1956,  MRN: [id number]  Robin [name] presents to clinic today for preventative diabetic foot care and painful thick toenails that are difficult to trim. Pain interferes with ambulation. Aggravating factors include wearing enclosed shoe gear. Pain is relieved with periodic professional debridement..  64 y.o. female presents with the above complaint.  Reports painfully elongated nails to both feet.  Review of Systems: Negative except as noted in the HPI. Past Medical History:  Diagnosis Date   Anxiety disorder    Arthritis    Asthma    Chronic neck pain    Constipation    Depression    Diabetes mellitus without complication (HCC)    Dyspnea    GERD (gastroesophageal reflux disease)    Hiatal hernia    small   Hyperlipidemia    Hypertension    Hypothyroidism    Morbid obesity with BMI of 50.0-59.9, adult ([place])    Schatzki's ring    non critical   Sinusitis    Sleep apnea    CPAP, Sleep study at [facility]   Sleep apnea    Past Surgical History:  Procedure Laterality Date   ABDOMINAL HYSTERECTOMY     ABDOMINAL HYSTERECTOMY  02/18/2000   CARDIAC CATHETERIZATION  08/18/2003   normal L main/LAD/L Cfx/RCA (Dr. Adora Fridge)   COLONOSCOPY  2006   Dr. [name], hyperplastic polyps   COLONOSCOPY  08/06/11   abnormal terminal ileum for 10cm, erosions, geographical ulceration. Bx small bowel mucosa with prominent intramucosal lymphoid aggregates, slightly inflammed   COLONOSCOPY WITH PROPOFOL N/A 12/28/2019   Rourk: 4 sessile serrated adenomas removed on the colon.  Next colonoscopy in 3 years.   DIRECT LARYNGOSCOPY  03/15/2012   Procedure: DIRECT LARYNGOSCOPY;  Surgeon: [name], MD;  Location: [facility];  Service: ENT;  Laterality: N/A; Dr. Wolicki--:>no foreign body seen. normal esophagus to 40cm   ESOPHAGEAL DILATION  04/17/2015   Procedure: ESOPHAGEAL DILATION;  Surgeon: [name], MD;  [facility];  Service: Endoscopy;;   ESOPHAGOGASTRODUODENOSCOPY  08/23/2008   UKG:URKY distal esophageal erosions consistent with mild erosive reflux esophagitis, otherwise unremarkable esophagus/ Tiny antral erosions of doubtful clinical significance, otherwise normal stomach, patent pylorus, normal D1 and D2   ESOPHAGOGASTRODUODENOSCOPY  08/06/11   small hh, noncritical Schatzki's ring s/p 23 F   ESOPHAGOGASTRODUODENOSCOPY N/A 04/17/2015   HCW:CBJSEG s/p dilation   ESOPHAGOGASTRODUODENOSCOPY (EGD) WITH PROPOFOL N/A 01/20/2018   Dr. [name]: Erosive gastropathy, normal-appearing esophagus status post empiric dilation.  Chronic gastritis, no H. pylori.   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  Current Outpatient Medications:    ACCU-CHEK AVIVA PLUS test strip, USE AS DIRECTED, Disp: 100 each, Rfl: 12   ACCU-CHEK AVIVA PLUS test strip, USE  AS INSTRUCTED, Disp: 100 strip, Rfl: 12   acetaminophen (TYLENOL) 500 MG tablet, Take 1,000 mg by mouth 2 (two) times daily. , Disp: , Rfl:    albuterol (VENTOLIN HFA) 108 (90 Base) MCG/ACT inhaler, Inhale 1-2 puffs into the lungs every 6 (six) hours as needed for wheezing or shortness of breath., Disp: 18 g, Rfl: 0   amitriptyline (ELAVIL) 50 MG tablet, TAKE 1 TABLET BY MOUTH AT BEDTIME. (Patient taking differently: Take 50 mg by mouth at bedtime. ), Disp: 90 tablet, Rfl: 0   aspirin EC 81 MG tablet, Take 81 mg by mouth at bedtime., Disp: , Rfl:    atorvastatin (LIPITOR) 20 MG tablet, Take 0.5 tablets (10 mg total) by mouth daily. (Patient not taking: Reported on 07/27/2020), Disp: 90 tablet, Rfl: 3   atorvastatin (LIPITOR) 40 MG tablet, Take 40 mg by mouth daily., Disp: , Rfl:    azelastine (ASTELIN) 0.1 % nasal spray, 1-2 sprays per nostril 2 times daily as needed (Patient taking differently: Place 1 spray into both nostrils 2 (two) times daily. ), Disp: 30 mL, Rfl: 12   buPROPion (WELLBUTRIN XL) 300 MG 24 hr tablet, TAKE 1 TABLET (300 MG TOTAL) BY MOUTH EVERY MORNING. (Patient taking differently: Take 300 mg by mouth daily. ), Disp: 90 tablet, Rfl: 2   CALCIUM CARBONATE ANTACID PO, Take 2 tablets by mouth See admin instructions. Take 2 tablets by mouth every morning, may also take 2 tablets at night as needed for acid reflux, Disp: , Rfl:    celecoxib (CELEBREX) 100 MG capsule, Take 1 capsule (100 mg total) by mouth 2 (two) times daily., Disp: 60 capsule, Rfl: 6   cetirizine (ZYRTEC) 10 MG tablet, Take 1 tablet (10 mg total) by mouth daily. (Patient not taking: Reported on 07/27/2020), Disp: 30 tablet, Rfl: 1   CINNAMON PO, Take 1 capsule by mouth daily., Disp: , Rfl:    diclofenac sodium (VOLTAREN) 1 % GEL, Apply 2-4 g topically 4 (four) times daily as needed (pain). , Disp: , Rfl:    DULoxetine (CYMBALTA) 30 MG capsule, TAKE 1 CAPSULE (30 MG TOTAL) BY MOUTH DAILY., Disp: 30 capsule,  Rfl: 6   esomeprazole (NEXIUM) 40 MG capsule, Take 1 capsule (40 mg total) by mouth 2 (two) times daily before a meal. (Patient taking differently: Take 40 mg by mouth daily. ), Disp: 60 capsule, Rfl: 5   famotidine (PEPCID) 20 MG tablet, Take 1 tablet (20 mg total) by mouth 2 (two) times daily as needed for heartburn or indigestion. (Patient taking differently: Take 20 mg by mouth 2 (two) times daily. ), Disp: 60 tablet, Rfl: 3   fluticasone (FLONASE) 50 MCG/ACT nasal spray, Place 1 spray into both nostrils daily. (Patient taking differently: Place 1 spray into both nostrils daily as needed for allergies or rhinitis. ), Disp: 16 g, Rfl: 1   fluticasone (FLOVENT HFA) 220 MCG/ACT inhaler, 2 inhalations twice daily (Patient taking differently: Inhale 2 puffs into the lungs 2 (two) times daily as needed (shortness of breath/wheezing). ), Disp: 1 each, Rfl: 12   furosemide (LASIX) 40 MG tablet, TAKE 1/2 TO  1 TABLET BY MOUTH DAILY FOR LOWER EXTREMITY SWELLING (Patient taking differently: Take 20 mg by mouth daily. ), Disp: 90 tablet, Rfl: 2   gabapentin (NEURONTIN) 300 MG capsule, TAKE 1 CAPSULE (300 MG TOTAL) BY MOUTH 4 (FOUR) TIMES DAILY. (Patient taking differently: Take 600 mg by mouth 2 (two) times daily. ), Disp: 120 capsule, Rfl: 2   glucose blood test strip, USE AS INSTRUCTED, Disp: , Rfl:    hydrALAZINE (APRESOLINE) 50 MG tablet, Take 1 tablet (50 mg total) by mouth 3 (three) times daily. (Patient taking differently: Take 50 mg by mouth 2 (two) times daily. ), Disp: 270 tablet, Rfl: 1   hydrochlorothiazide (HYDRODIURIL) 25 MG tablet, Take 25 mg by mouth daily., Disp: , Rfl:    levothyroxine (SYNTHROID) 88 MCG tablet, TAKE 1 TABLET (88 MCG TOTAL) BY MOUTH DAILY., Disp: 30 tablet, Rfl: 2   LINZESS 290 MCG CAPS capsule, TAKE 1 CAPSULE BY MOUTH DAILY BEFORE BREAKFAST. (Patient taking differently: Take 290 mcg by mouth daily. ), Disp: 90 capsule, Rfl: 3   losartan (COZAAR) 25 MG tablet, Take  25 mg by mouth daily., Disp: , Rfl:    metFORMIN (GLUCOPHAGE) 1000 MG tablet, Take 1,000 mg by mouth 2 (two) times daily., Disp: , Rfl:    metFORMIN (GLUCOPHAGE) 500 MG tablet, Take 1 tablet (500 mg total) by mouth daily with breakfast. (Patient not taking: Reported on 07/27/2020), Disp: 30 tablet, Rfl: 6   Metoprolol Tartrate 75 MG TABS, TAKE 1 TABLET BY MOUTH TWICE DAILY. (Patient taking differently: Take 75 mg by mouth 2 (two) times daily. ), Disp: 180 tablet, Rfl: 2   montelukast (SINGULAIR) 10 MG tablet, TAKE 1 TABLET BY MOUTH AT BEDTIME. (Patient not taking: Reported on 07/27/2020), Disp: 90 tablet, Rfl: 2   Multiple Vitamin (MULTIVITAMIN WITH MINERALS) TABS tablet, Take 1 tablet by mouth daily. , Disp: , Rfl:    Omega-3 Fatty Acids (FISH OIL) 1000 MG CAPS, Take 1,000 mg by mouth at bedtime. , Disp: , Rfl:    ondansetron (ZOFRAN) 4 MG tablet, Take 4 mg by mouth every 8 (eight) hours as needed for nausea or vomiting. , Disp: , Rfl:    OVER THE COUNTER MEDICATION, Take 1 capsule by mouth daily. BEET ROOT , Disp: , Rfl:    oxyCODONE-acetaminophen (PERCOCET) 10-325 MG tablet, Take 1 tablet by mouth 2 (two) times daily.  (Patient not taking: Reported on 07/27/2020), Disp: , Rfl:    pregabalin (LYRICA) 25 MG capsule, Take 25 mg by mouth 2 (two) times daily. (Patient not taking: Reported on 07/27/2020), Disp: , Rfl:    spironolactone (ALDACTONE) 25 MG tablet, Take 1 tablet (25 mg total) by mouth daily., Disp: 30 tablet, Rfl: 10   TRULICITY 5.36 IW/8.0HO SOPN, Inject 0.75 mg into the skin once a week. , Disp: , Rfl:    valACYclovir (VALTREX) 500 MG tablet, Take 500 mg by mouth 2 (two) times daily. (Patient not taking: Reported on 07/27/2020), Disp: , Rfl:    vitamin B-12 (CYANOCOBALAMIN) 1000 MCG tablet, Take 1,000 mcg by mouth daily., Disp: , Rfl:    Vitamin D, Ergocalciferol, (DRISDOL) 1.25 MG (50000 UNIT) CAPS capsule, Take 50,000 Units by mouth once a week., Disp: , Rfl:     conjugated estrogens (PREMARIN) vaginal cream, Place 1 Applicatorful vaginally daily as needed (vaginal dryness/itching)., Disp: , Rfl:    OVER THE COUNTER MEDICATION, Take 2 tablets by mouth 2 (two) times daily. OTC allergy pill, Disp: , Rfl:    tiZANidine (ZANAFLEX)  4 MG tablet, Take 4 mg by mouth 2 (two) times daily as needed for muscle spasms., Disp: , Rfl:   Facility-Administered Medications Ordered in Other Visits:    acetaminophen (TYLENOL) tablet 1,000 mg, 1,000 mg, Oral, BID, Daleen Bo, MD, 1,000 mg at 07/28/20 1004   albuterol (VENTOLIN HFA) 108 (90 Base) MCG/ACT inhaler 1-2 puff, 1-2 puff, Inhalation, Q6H PRN, Daleen Bo, MD   amitriptyline (ELAVIL) tablet 50 mg, 50 mg, Oral, QHS, Daleen Bo, MD, 50 mg at 07/27/20 2234   aspirin EC tablet 81 mg, 81 mg, Oral, QHS, Daleen Bo, MD, 81 mg at 07/27/20 2235   atorvastatin (LIPITOR) tablet 10 mg, 10 mg, Oral, Daily, Daleen Bo, MD, 10 mg at 07/28/20 1003   atorvastatin (LIPITOR) tablet 40 mg, 40 mg, Oral, Daily, Daleen Bo, MD, 40 mg at 07/28/20 1018   buPROPion (WELLBUTRIN XL) 24 hr tablet 300 mg, 300 mg, Oral, Daily, Daleen Bo, MD, 300 mg at 07/28/20 1006   calcium carbonate (TUMS - dosed in mg elemental calcium) chewable tablet 400 mg of elemental calcium, 2 tablet, Oral, QHS, Daleen Bo, MD, 400 mg of elemental calcium at 07/27/20 2233   celecoxib (CELEBREX) capsule 100 mg, 100 mg, Oral, BID, Daleen Bo, MD, 100 mg at 07/28/20 1019   DULoxetine (CYMBALTA) DR capsule 30 mg, 30 mg, Oral, Daily, Daleen Bo, MD, 30 mg at 07/28/20 1016   furosemide (LASIX) tablet 20 mg, 20 mg, Oral, Daily, Daleen Bo, MD, 20 mg at 07/28/20 1019   gabapentin (NEURONTIN) capsule 300 mg, 300 mg, Oral, QID, Daleen Bo, MD, 300 mg at 07/28/20 1745   hydrALAZINE (APRESOLINE) tablet 50 mg, 50 mg, Oral, TID, Daleen Bo, MD, 50 mg at 07/28/20 1746   hydrochlorothiazide (HYDRODIURIL) tablet 25 mg, 25  mg, Oral, Daily, Daleen Bo, MD, 25 mg at 07/28/20 1007   levothyroxine (SYNTHROID) tablet 88 mcg, 88 mcg, Oral, Daily, Daleen Bo, MD, 88 mcg at 07/28/20 1003   linaclotide (LINZESS) capsule 290 mcg, 290 mcg, Oral, Daily, Daleen Bo, MD, 290 mcg at 07/28/20 1020   loratadine (CLARITIN) tablet 10 mg, 10 mg, Oral, Daily, Daleen Bo, MD, 10 mg at 07/28/20 1006   losartan (COZAAR) tablet 25 mg, 25 mg, Oral, Daily, Daleen Bo, MD, 25 mg at 07/28/20 1016   metFORMIN (GLUCOPHAGE) tablet 1,000 mg, 1,000 mg, Oral, BID, Daleen Bo, MD, 1,000 mg at 07/28/20 1003   metoprolol tartrate (LOPRESSOR) tablet 75 mg, 75 mg, Oral, BID, Daleen Bo, MD, 75 mg at 07/28/20 1004   montelukast (SINGULAIR) tablet 10 mg, 10 mg, Oral, QHS, Daleen Bo, MD, 10 mg at 07/27/20 2235   oxyCODONE-acetaminophen (PERCOCET/ROXICET) 5-325 MG per tablet 1 tablet, 1 tablet, Oral, BID, 1 tablet at 07/28/20 1005 **AND** oxyCODONE (Oxy IR/ROXICODONE) immediate release tablet 5 mg, 5 mg, Oral, BID, Daleen Bo, MD, 5 mg at 07/28/20 1004   pantoprazole (PROTONIX) EC tablet 40 mg, 40 mg, Oral, Daily, Daleen Bo, MD, 40 mg at 07/28/20 1006   pregabalin (LYRICA) capsule 25 mg, 25 mg, Oral, BID, Daleen Bo, MD, 25 mg at 07/28/20 1005   spironolactone (ALDACTONE) tablet 25 mg, 25 mg, Oral, Daily, Daleen Bo, MD, 25 mg at 07/28/20 1019   valACYclovir (VALTREX) tablet 500 mg, 500 mg, Oral, BID, Daleen Bo, MD, 500 mg at 07/28/20 1005 Allergies  Allergen Reactions   Celexa [Citalopram Hydrobromide] Other (See Comments)    "Made me feel out of it"   Gabapentin Other (See Comments)    Contributed to lower extremity  edema   Norvasc [Amlodipine Besylate] Other (See Comments)    Edema in lower extremities   Diflucan [Fluconazole] Rash   Social History   Occupational History    Employer: UNEMPLOYED  Tobacco Use   Smoking status: Former Smoker    Packs/day: 0.50    Years: 10.00     Pack years: 5.00    Types: Cigarettes    Start date: 07/14/1975    Quit date: 04/04/1993    Years since quitting: 27.3   Smokeless tobacco: Never Used   Tobacco comment: 17 yrs ago  Vaping Use   Vaping Use: Never used  Substance and Sexual Activity   Alcohol use: No    Alcohol/week: 0.0 standard drinks   Drug use: No   Sexual activity: Yes    Objective:   Constitutional Robin [name] is a pleasant 64 y.o. African American female, morbidly obese in NAD.Marland Kitchen AAO x 3.   Vascular Dorsalis pedis pulses palpable bilaterally. Posterior tibial pulses palpable bilaterally.Capillary refill normal to all digits. No cyanosis or clubbing noted. Pedal hair growth diminished b/l. Lower extremity skin temperature gradient within normal limits. No pain with calf compression b/l. No edema noted b/l lower extremities.  Neurologic Normal speech. Oriented to person, place, and time. Protective sensation intact 5/5 intact bilaterally with 10g monofilament b/l. Vibratory sensation intact b/l. Proprioception intact bilaterally.  Dermatologic . Pedal skin with normal turgor, texture and tone bilaterally. No open wounds bilaterally. No interdigital macerations bilaterally. Toenails 1-5 b/l elongated, discolored, dystrophic, thickened, crumbly with subungual debris and tenderness to dorsal palpation.  Orthopedic: Normal muscle strength 5/5 to all lower extremity muscle groups bilaterally. No pain crepitus or joint limitation noted with ROM b/l. No gross bony deformities bilaterally. Utilizes motorized chair for mobility assistance.   Radiographs: None Assessment:   1. Pain due to onychomycosis of toenails of both feet    Plan:  Patient was evaluated and treated and all questions answered.  Onychomycosis with pain -Nails palliatively debridement as below -Educated on self-care  Procedure: Nail Debridement Rationale: Pain Type of Debridement: manual, sharp debridement. Instrumentation: Nail nipper,  rotary burr. Number of Nails: 10 -Examined patient. -No new findings. No new orders. -Continue diabetic foot care principles. -Toenails 1-5 b/l were debrided in length and girth with sterile nail nippers and dremel without iatrogenic bleeding.  -Patient to report any pedal injuries to medical professional immediately. -Patient to continue soft, supportive shoe gear daily. -Patient/POA to call should there be question/concern in the interim.  Return in about 3 months (around 10/26/2020).  Marzetta Board, DPM

## 2020-07-28 NOTE — Progress Notes (Signed)
Patient meets criteria for inpatient geropsych treatment per Letitia Libra NP. CSW faxed referrals to the following facilities for review:  [facility]  TTS will continue to seek bed placement.   Maxie Better, MSW, LCSW Clinical Social Worker 07/28/2020 7:59 AM

## 2020-07-28 NOTE — ED Provider Notes (Signed)
Emergency Medicine Observation Re-evaluation Note  Robin [name] is a 64 y.o. female, seen on rounds today.  Pt initially presented to the ED for complaints of Medical Clearance, Delusional, and Homicidal Currently, the patient is calm and comfortable.  Physical Exam  BP 138/61 (BP Location: Right Wrist)   Pulse 73   Temp 98.2 F (36.8 C) (Oral)   Resp 18   Ht 5\' 8"  (1.727 m)   Wt (!) 155.6 kg   SpO2 98%   BMI 52.15 kg/m  Physical Exam General: Obese Cardiac: Normal rate Lungs: No respiratory distress Psych: Appears depressed, continued flight of ideas  ED Course / MDM  EKG:    I have reviewed the labs performed to date as well as medications administered while in observation.  Recent changes in the last 24 hours include continues to be unstable, requiring psychiatric treatment.  Plan  Current plan is for psychiatric hospitalization. Patient is under full IVC at this time.   Daleen Bo, MD 07/28/20 [phone]

## 2020-07-28 NOTE — ED Notes (Signed)
Pt reports not urinating at all in the past 24 hours. Bladder scan at 11:00 showed 255ml and pt voided 325 at 12:00.

## 2020-07-29 ENCOUNTER — Emergency Department (HOSPITAL_COMMUNITY): Payer: Medicaid Other

## 2020-07-29 ENCOUNTER — Ambulatory Visit: Payer: Medicaid Other | Admitting: Internal Medicine

## 2020-07-29 ENCOUNTER — Encounter (HOSPITAL_COMMUNITY): Payer: Self-pay | Admitting: Registered Nurse

## 2020-07-29 DIAGNOSIS — R4182 Altered mental status, unspecified: Secondary | ICD-10-CM | POA: Diagnosis not present

## 2020-07-29 LAB — URINALYSIS, ROUTINE W REFLEX MICROSCOPIC
Bilirubin Urine: NEGATIVE
Glucose, UA: NEGATIVE mg/dL
Hgb urine dipstick: NEGATIVE
Ketones, ur: NEGATIVE mg/dL
Leukocytes,Ua: NEGATIVE
Nitrite: NEGATIVE
Protein, ur: 30 mg/dL — AB
Specific Gravity, Urine: 1.035 — ABNORMAL HIGH (ref 1.005–1.030)
pH: 5 (ref 5.0–8.0)

## 2020-07-29 LAB — VITAMIN B12: Vitamin B-12: 1006 pg/mL — ABNORMAL HIGH (ref 180–914)

## 2020-07-29 LAB — CBG MONITORING, ED
Glucose-Capillary: 137 mg/dL — ABNORMAL HIGH (ref 70–99)
Glucose-Capillary: 137 mg/dL — ABNORMAL HIGH (ref 70–99)

## 2020-07-29 LAB — TSH: TSH: 1.697 u[IU]/mL (ref 0.350–4.500)

## 2020-07-29 NOTE — ED Provider Notes (Signed)
Emergency Medicine Observation Re-evaluation Note  Robin [name] is a 64 y.o. female, seen on rounds today.  Pt initially presented to the ED for complaints of Medical Clearance, Delusional, and Homicidal Currently, the patient is resting comfortably.  She is pending placement for Geripsych.    From psychiatry team evaluation yesterday:  " Daily contact with patient to assess and evaluate symptoms and progress in treatment and Medication management.  She has multiple medical comorbidity she would benefit from geriatric psych placement for mood stabilization.  She continues to take po psychotropic medications, remains calm and cooperative with staff.    Patient continues to meet criteria for geriatric inpatient psychiatric admission.  SW has continues to fax her information out to locate accepting facilities. This information was discussed with the patient who voluntarily accepts inpatient admission and requests to stay in [place] area citing her local support.  Discussed, preference is given to the first accepting facility regardless of location."  Physical Exam  BP 126/61 (BP Location: Left Arm)   Pulse 78   Temp 97.7 F (36.5 C) (Oral)   Resp 16   Ht 5\' 8"  (1.727 m)   Wt (!) 155.6 kg   SpO2 92%   BMI 52.15 kg/m  Physical Exam General: NAD Cardiac: RRR Lungs: No respiratory distress Psych: STable  ED Course / MDM  EKG:    I have reviewed the labs performed to date as well as medications administered while in observation.    Plan  Current plan is for Geri-psych placement Patient is under full IVC at this time.   Wyvonnia Dusky, MD 07/29/20 [phone]

## 2020-07-29 NOTE — Progress Notes (Addendum)
2nd shift ED CSW received a handoff from the 1st shift [facility] ED Disposition stating pt requires transport to be set up for pt to go to [facility]) at:  Address: [address], [place], [place] [address] which is approximately 52.7 miles away.  Per Disposition pt is currently not IVC'd but is appropriate for IVC is necessary for safety.  5:03 PM UPDATE.  Per EPD, pt is now IVC'd due to pt refusing to go and per the psychiatric NP, pt is medically appropriate for [facility] transport via [facility] due to needing an assist when ambulating as pt is unsteady on her feet, difficulty getting up and the need for a walker.  CSW called [facility] and spoke to dispatcher Dreama who stated that even though pt's destination is approximately 52.7 miles away a few miles is not something that would be a problem and Miss Dreama scheduled transport for 9:30 am on 07/30/20.  CSW called the Sheriff[employer] Dept's IVC transport office and scheduled deputies to arrive at the [facility] ED to room [phone] to escort [facility] to [facility] on  10/26.    CSW provided [employer] Deputy with th ED TCU RN's phone number and the [facility] ED CSW's # at [phone]  2nd shift ED CSW will leave handoff for 1st shift ED CSW.  RN updated. CSW requested that pt's RN please leave a handoff to pt's RN on 10/26 please call the following to confirm both are arriving at 9:30 am as as scheduled:  1. [employer] IVC Transport at ph: [phone] and:  2. [facility] Dispatcher at ph: [phone]  CSW will continue to follow for D/C needs.  Alphonse Guild. Eila Runyan  MSW, LCSW, LCAS, CCS Transitions of Care Clinical Social Worker Care Coordination Department Ph: [phone]

## 2020-07-29 NOTE — ED Notes (Signed)
Pt alert and oriented . Pt cooperative this shift. Pt depressed. Pt continues to believe husband is leaving her for another woman.  Pt medication compliant. Pt able to ambulate with walker and stand by assist. Pt able to feed self. Pt able to wash self with set up.

## 2020-07-29 NOTE — ED Provider Notes (Signed)
Blood pressure (!) 156/80, pulse 72, temperature 97.6 F (36.4 C), resp. rate 16, height 5\' 8"  (1.727 m), weight (!) 155.6 kg, SpO2 96 %.  In short, Robin [name] is a 64 y.o. female with a chief complaint of Medical Clearance, Delusional, and Homicidal .  Refer to the original H&P for additional details.  Patient has been accepted to inpatient psychiatry by Dr. Ina Kick. Patient is voluntary for now and TTS will update patient on transport plan and admit.   04:50 PM  Patient is refusing transport at this time. I have evaluated her and reviewed prior notes. Patient does meet IVC criteria at this time and I have filed the paperwork. Patient is experiencing delusional thinking and has expressed a specific plan and desire to kill her husband. I do not feel that she has capacity to leave on her own AMA and is a danger to both herself and others.     Margette Fast, MD 07/29/20 1655

## 2020-07-29 NOTE — BH Assessment (Signed)
[facility] Assessment Progress Note  Per Shuvon Rankin, NP, this voluntary pt require psychiatric hospitalization at this time.  At 16:06 Robin [name] calls from [facility].  Pt has been accepted to their facility by Dr Jannette Spanner to the [place] [facility], Rm 159.  EDP Nanda Quinton, MD, concurs with this disposition.  Pt's nurse, Eustaquio Maize, has been notified, and agrees to call report to [phone].  [facility] stipulates that pt must arrive either before 23:00 tonight, or after 06:00 tomorrow.    Please note that, given that pt is voluntary, she must agree to this plan before she is transferred.  At 16:15 this Probation officer spoke to pt about the plan, and she wants to speak to her daughter, Robin [name], before agreeing.  Pt called the daughter and left a voice message; return call is pending as of this writing.  If pt refuses referral, however, please discuss initiating IVC with Dr Laverta Baltimore, as Robin [name] finds that pt meets criteria for IVC if necessary.  If the plan changes in any way please call the facility to inform them of the change.  Final disposition is pending as of this writing.  Jalene Mullet, [place] Coordinator [phone]

## 2020-07-29 NOTE — Telephone Encounter (Signed)
Returned call and spoke with representative. Rep stated they refaxed paperwork and inquiring regarding question #[phone]. Rep also stated that they have 2 orders for pt and will need to contact pt to see which product she is wanting and will call back.

## 2020-07-29 NOTE — Consult Note (Signed)
[facility] Psych ED Progress Note  07/29/2020 10:07 AM Robin [name]  MRN:  [id number]    Psychiatric consult Follow up 07/29/2020:  Subjective: "My husband told me he don't want me no mo and he is leaving me.  I been married to this man for 20 years and all of a sudden he wants another woman."   Robin [name], 64 y.o., female patient seen via tele psych by this provider, Dr. Serafina [name]; and chart reviewed on 07/29/20.  On evaluation Robin [name] is sitting on side of bed eating her breakfast.  States that she slept well last night and that she has been taking her medications with no adverse reactions since she has been in the emergency room.  Patient states that she lives with her husband and has 4 children but is closest to her oldest daughter who was there visiting yesterday.  When asked if she was having homicidal thoughts patient stated "My husband."  When asked if there was a plan, patient stated "I'm sure when comes to that I will have one."  Patient denies suicidal/self-harm ideation and psychosis.  Spoke to patiens nurse who reports that patient has been doing fine with no verbal or behavioral outburst and patient has been taking medications.  Patient continues to need geropsychiatric hospitalization related to delusional thoughts about husband and homicidal ideation towards him.      Psychiatric Consult Follow up 07/28/2020 Subjective:  "I'm not feeling that good today. Patient seen via telepsych by this provider; chart reviewed and consulted with Dr. Darleene [name] on 07/29/20.  On evaluation Robin [name] reports, " I am not feeling that good today.  My husband is cheating on me and has decided he wants to stay with the other woman. I want to hurt him because of this"  She is sitting at the bedside with her legs dangled to the bedside table, eating breakfast.  She looks sad, despondent but is polite and cooperative.  She denies suicidal ideations but continues to endorse homicidal ideations with a  plan to hurt her husband.     Principal Problem: Adjustment disorder with mixed disturbance of emotions and conduct Diagnosis:  Principal Problem:   Adjustment disorder with mixed disturbance of emotions and conduct  Total Time spent with patient: 30 minutes  Past Psychiatric History:   Past Medical History:  Past Medical History:  Diagnosis Date  . Anxiety disorder   . Arthritis   . Asthma   . Chronic neck pain   . Constipation   . Depression   . Diabetes mellitus without complication ([place])   . Dyspnea   . GERD (gastroesophageal reflux disease)   . Hiatal hernia    small  . Hyperlipidemia   . Hypertension   . Hypothyroidism   . Morbid obesity with BMI of 50.0-59.9, adult ([place])   . Schatzki's ring    non critical  . Sinusitis   . Sleep apnea    CPAP, Sleep study at [facility]  . Sleep apnea     Past Surgical History:  Procedure Laterality Date  . ABDOMINAL HYSTERECTOMY    . ABDOMINAL HYSTERECTOMY  02/18/2000  . CARDIAC CATHETERIZATION  08/18/2003   normal L main/LAD/L Cfx/RCA (Dr. Adora [name])  . COLONOSCOPY  2006   Dr. Aviva [name], hyperplastic polyps  . COLONOSCOPY  08/06/11   abnormal terminal ileum for 10cm, erosions, geographical ulceration. Bx small bowel mucosa with prominent intramucosal lymphoid aggregates, slightly inflammed  . COLONOSCOPY WITH PROPOFOL  N/A 12/28/2019   Robin [name]: 4 sessile serrated adenomas removed on the colon.  Next colonoscopy in 3 years.  Marland Kitchen DIRECT LARYNGOSCOPY  03/15/2012   Procedure: DIRECT LARYNGOSCOPY;  Surgeon: Robin Marble, MD;  Location: [place];  Service: ENT;  Laterality: N/A; Dr. Wolicki--:>no foreign body seen. normal esophagus to 40cm  . ESOPHAGEAL DILATION  04/17/2015   Procedure: ESOPHAGEAL DILATION;  Surgeon: Robin Dolin, MD;  [facility];  Service: Endoscopy;;  . ESOPHAGOGASTRODUODENOSCOPY  08/23/2008   DUK:GURK distal esophageal erosions consistent with mild erosive reflux esophagitis, otherwise  unremarkable esophagus/ Tiny antral erosions of doubtful clinical significance, otherwise normal stomach, patent pylorus, normal D1 and D2  . ESOPHAGOGASTRODUODENOSCOPY  08/06/11   small hh, noncritical Schatzki's ring s/p 25 F  . ESOPHAGOGASTRODUODENOSCOPY N/A 04/17/2015   YHC:WCBJSE s/p dilation  . ESOPHAGOGASTRODUODENOSCOPY (EGD) WITH PROPOFOL N/A 01/20/2018   Dr. Gala [name]: Erosive gastropathy, normal-appearing esophagus status post empiric dilation.  Chronic gastritis, no H. pylori.  . ESOPHAGOSCOPY  03/15/2012   Procedure: ESOPHAGOSCOPY;  Surgeon: Robin Marble, MD;  Location: [place];  Service: ENT;  Laterality: N/A;  . KNEE ARTHROSCOPY  04/27/2001  . MALONEY DILATION N/A 01/20/2018   Procedure: Venia Minks DILATION;  Surgeon: Robin Dolin, MD;  [facility];  Service: Endoscopy;  Laterality: N/A;  . Robin [name]  2003   negative bruce protocol exercise tress test; EF 68%; intermediate risk study due to evidence of anterior wall ischemia extending from mid-ventricle to apex  . PITUITARY SURGERY  06/2012   benign tumor, surgeon at [facility]  . POLYPECTOMY  12/28/2019   Procedure: POLYPECTOMY;  Surgeon: Robin Dolin, MD;  [facility];  Service: Endoscopy;;  . RECTOCELE REPAIR    . TRANSTHORACIC ECHOCARDIOGRAM  2003   EF normal  . VAGINAL PROLAPSE REPAIR    . VIDEO BRONCHOSCOPY Bilateral 03/08/2017   Procedure: VIDEO BRONCHOSCOPY WITHOUT FLUORO;  Surgeon: Robin Glazier, MD;  Location: [place];  Service: Cardiopulmonary;  Laterality: Bilateral;   Family History:  Family History  Problem Relation Age of Onset  . Kidney cancer Father   . Hypertension Father   . Hyperlipidemia Father   . Sleep apnea Father   . Diabetes Mother   . Hypertension Mother   . Heart Problems Mother   . Hyperlipidemia Mother   . Asthma Mother   . Sleep apnea Mother   . Liver disease Brother   . Arthritis Brother   . Hypertension Sister   . Allergic rhinitis Sister   .  Stomach cancer Other        aunt  . Breast cancer Maternal Grandmother   . Kidney disease Maternal Grandfather   . Breast cancer Paternal Grandmother   . Hypertension Brother   . Hyperlipidemia Brother   . Hypertension Brother   . Hypertension Sister   . Hyperlipidemia Sister   . Lupus Maternal Aunt   . Eczema Daughter   . Colon cancer Neg Hx   . Immunodeficiency Neg Hx   . Urticaria Neg Hx    Family Psychiatric  History:denies Social History:  Social History   Substance and Sexual Activity  Alcohol Use No  . Alcohol/week: 0.0 standard drinks     Social History   Substance and Sexual Activity  Drug Use No    Social History   Socioeconomic History  . Marital status: Married    Spouse name: Not on file  . Number of children: 4  . Years  of education: 10  . Highest education level: Not on file  Occupational History    Employer: UNEMPLOYED  Tobacco Use  . Smoking status: Former Smoker    Packs/day: 0.50    Years: 10.00    Pack years: 5.00    Types: Cigarettes    Start date: 07/14/1975    Quit date: 04/04/1993    Years since quitting: 27.3  . Smokeless tobacco: Never Used  . Tobacco comment: 17 yrs ago  Vaping Use  . Vaping Use: Never used  Substance and Sexual Activity  . Alcohol use: No    Alcohol/week: 0.0 standard drinks  . Drug use: No  . Sexual activity: Yes  Other Topics Concern  . Not on file  Social History Narrative   Lives with husband in an apartment on the first floor.  Has 4 children.     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  Trying to get disability.  Formerly worked as a Teacher, early years/pre.  Education: 11th grade.      [place] [facility] (12/23/16):   Originally from [place]. She was raised in [place]. 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 No pets currently. No bird exposure. She does have mold  in her current home in the bathroom, laundry room, & master bedroom.    Social Determinants of Health   Financial Resource Strain:   . Difficulty of Paying Living Expenses: Not on file  Food Insecurity:   . Worried About Charity fundraiser in the Last Year: Not on file  . Ran Out of Food in the Last Year: Not on file  Transportation Needs: No Transportation Needs  . Lack of Transportation (Medical): No  . Lack of Transportation (Non-Medical): No  Physical Activity:   . Days of Exercise per Week: Not on file  . Minutes of Exercise per Session: Not on file  Stress:   . Feeling of Stress : Not on file  Social Connections:   . Frequency of Communication with Friends and Family: Not on file  . Frequency of Social Gatherings with Friends and Family: Not on file  . Attends Religious Services: Not on file  . Active Member of Clubs or Organizations: Not on file  . Attends Archivist Meetings: Not on file  . Marital Status: Not on file    Sleep: Good  Appetite:  Good  Current Medications: Current Facility-Administered Medications  Medication Dose Route Frequency Provider Last Rate Last Admin  . acetaminophen (TYLENOL) tablet 1,000 mg  1,000 mg Oral BID Daleen Bo, MD   1,000 mg at 07/28/20 2206  . albuterol (VENTOLIN HFA) 108 (90 Base) MCG/ACT inhaler 1-2 puff  1-2 puff Inhalation Q6H PRN Daleen Bo, MD      . amitriptyline (ELAVIL) tablet 50 mg  50 mg Oral QHS Daleen Bo, MD   50 mg at 07/28/20 2207  . aspirin EC tablet 81 mg  81 mg Oral QHS Daleen Bo, MD   81 mg at 07/28/20 2206  . atorvastatin (LIPITOR) tablet 10 mg  10 mg Oral Daily Daleen Bo, MD   10 mg at 07/28/20 1003  . atorvastatin (LIPITOR) tablet 40 mg  40 mg Oral Daily Daleen Bo, MD   40 mg at 07/28/20 1018  . buPROPion (WELLBUTRIN XL) 24 hr tablet 300 mg  300 mg Oral Daily Daleen Bo, MD   300 mg at 07/28/20 1006  . calcium carbonate (TUMS - dosed in mg elemental calcium) chewable  tablet 400 mg of elemental calcium  2 tablet Oral QHS Daleen Bo, MD   400 mg of elemental calcium at 07/28/20 2206  . celecoxib (CELEBREX) capsule 100 mg  100 mg Oral BID Daleen Bo, MD   100 mg at 07/28/20 1019  . DULoxetine (CYMBALTA) DR capsule 30 mg  30 mg Oral Daily Daleen Bo, MD   30 mg at 07/28/20 1016  . furosemide (LASIX) tablet 20 mg  20 mg Oral Daily Daleen Bo, MD   20 mg at 07/28/20 1019  . gabapentin (NEURONTIN) capsule 300 mg  300 mg Oral QID Daleen Bo, MD   300 mg at 07/28/20 2207  . hydrALAZINE (APRESOLINE) tablet 50 mg  50 mg Oral TID Daleen Bo, MD   50 mg at 07/28/20 2206  . hydrochlorothiazide (HYDRODIURIL) tablet 25 mg  25 mg Oral Daily Daleen Bo, MD   25 mg at 07/28/20 1007  . levothyroxine (SYNTHROID) tablet 88 mcg  88 mcg Oral Daily Daleen Bo, MD   88 mcg at 07/28/20 1003  . linaclotide (LINZESS) capsule 290 mcg  290 mcg Oral Daily Daleen Bo, MD   290 mcg at 07/28/20 1020  . loratadine (CLARITIN) tablet 10 mg  10 mg Oral Daily Daleen Bo, MD   10 mg at 07/28/20 1006  . losartan (COZAAR) tablet 25 mg  25 mg Oral Daily Daleen Bo, MD   25 mg at 07/28/20 1016  . metFORMIN (GLUCOPHAGE) tablet 1,000 mg  1,000 mg Oral BID Daleen Bo, MD   1,000 mg at 07/28/20 2208  . metoprolol tartrate (LOPRESSOR) tablet 75 mg  75 mg Oral BID Daleen Bo, MD   75 mg at 07/28/20 2205  . montelukast (SINGULAIR) tablet 10 mg  10 mg Oral QHS Daleen Bo, MD   10 mg at 07/27/20 2235  . oxyCODONE-acetaminophen (PERCOCET/ROXICET) 5-325 MG per tablet 1 tablet  1 tablet Oral BID Daleen Bo, MD   1 tablet at 07/28/20 2206   And  . oxyCODONE (Oxy IR/ROXICODONE) immediate release tablet 5 mg  5 mg Oral BID Daleen Bo, MD   5 mg at 07/28/20 2205  . pantoprazole (PROTONIX) EC tablet 40 mg  40 mg Oral Daily Daleen Bo, MD   40 mg at 07/28/20 1006  . pregabalin (LYRICA) capsule 25 mg  25 mg Oral BID Daleen Bo, MD   25 mg at 07/28/20  2208  . spironolactone (ALDACTONE) tablet 25 mg  25 mg Oral Daily Daleen Bo, MD   25 mg at 07/28/20 1019  . valACYclovir (VALTREX) tablet 500 mg  500 mg Oral BID Daleen Bo, MD   500 mg at 07/28/20 2206   Current Outpatient Medications  Medication Sig Dispense Refill  . acetaminophen (TYLENOL) 500 MG tablet Take 1,000 mg by mouth 2 (two) times daily.     Marland Kitchen albuterol (VENTOLIN HFA) 108 (90 Base) MCG/ACT inhaler Inhale 1-2 puffs into the lungs every 6 (six) hours as needed for wheezing or shortness of breath. 18 g 0  . amitriptyline (ELAVIL) 50 MG tablet TAKE 1 TABLET BY MOUTH AT BEDTIME. (Patient taking differently: Take 50 mg by mouth at bedtime. ) 90 tablet 0  . aspirin EC 81 MG tablet Take 81 mg by mouth at bedtime.    Marland Kitchen atorvastatin (LIPITOR) 40 MG tablet Take 40 mg by mouth daily.    Marland Kitchen  azelastine (ASTELIN) 0.1 % nasal spray 1-2 sprays per nostril 2 times daily as needed (Patient taking differently: Place 1 spray into both nostrils 2 (two) times daily. ) 30 mL 12  . buPROPion (WELLBUTRIN XL) 300 MG 24 hr tablet TAKE 1 TABLET (300 MG TOTAL) BY MOUTH EVERY MORNING. (Patient taking differently: Take 300 mg by mouth daily. ) 90 tablet 2  . CALCIUM CARBONATE ANTACID PO Take 2 tablets by mouth See admin instructions. Take 2 tablets by mouth every morning, may also take 2 tablets at night as needed for acid reflux    . celecoxib (CELEBREX) 100 MG capsule Take 1 capsule (100 mg total) by mouth 2 (two) times daily. 60 capsule 6  . CINNAMON PO Take 1 capsule by mouth daily.    Marland Kitchen conjugated estrogens (PREMARIN) vaginal cream Place 1 Applicatorful vaginally daily as needed (vaginal dryness/itching).    Marland Kitchen diclofenac sodium (VOLTAREN) 1 % GEL Apply 2-4 g topically 4 (four) times daily as needed (pain).     . DULoxetine (CYMBALTA) 30 MG capsule TAKE 1 CAPSULE (30 MG TOTAL) BY MOUTH DAILY. 30 capsule 6  . esomeprazole (NEXIUM) 40 MG capsule Take 1 capsule (40 mg total) by mouth 2 (two) times daily  before a meal. (Patient taking differently: Take 40 mg by mouth daily. ) 60 capsule 5  . famotidine (PEPCID) 20 MG tablet Take 1 tablet (20 mg total) by mouth 2 (two) times daily as needed for heartburn or indigestion. (Patient taking differently: Take 20 mg by mouth 2 (two) times daily. ) 60 tablet 3  . fluticasone (FLONASE) 50 MCG/ACT nasal spray Place 1 spray into both nostrils daily. (Patient taking differently: Place 1 spray into both nostrils daily as needed for allergies or rhinitis. ) 16 g 1  . fluticasone (FLOVENT HFA) 220 MCG/ACT inhaler 2 inhalations twice daily (Patient taking differently: Inhale 2 puffs into the lungs 2 (two) times daily as needed (shortness of breath/wheezing). ) 1 each 12  . furosemide (LASIX) 40 MG tablet TAKE 1/2 TO 1 TABLET BY MOUTH DAILY FOR LOWER EXTREMITY SWELLING (Patient taking differently: Take 20 mg by mouth daily. ) 90 tablet 2  . gabapentin (NEURONTIN) 300 MG capsule TAKE 1 CAPSULE (300 MG TOTAL) BY MOUTH 4 (FOUR) TIMES DAILY. (Patient taking differently: Take 600 mg by mouth 2 (two) times daily. ) 120 capsule 2  . hydrALAZINE (APRESOLINE) 50 MG tablet Take 1 tablet (50 mg total) by mouth 3 (three) times daily. (Patient taking differently: Take 50 mg by mouth 2 (two) times daily. ) 270 tablet 1  . levothyroxine (SYNTHROID) 88 MCG tablet TAKE 1 TABLET (88 MCG TOTAL) BY MOUTH DAILY. 30 tablet 2  . LINZESS 290 MCG CAPS capsule TAKE 1 CAPSULE BY MOUTH DAILY BEFORE BREAKFAST. (Patient taking differently: Take 290 mcg by mouth daily. ) 90 capsule 3  . losartan (COZAAR) 25 MG tablet Take 25 mg by mouth daily.    . metFORMIN (GLUCOPHAGE) 1000 MG tablet Take 1,000 mg by mouth 2 (two) times daily.    . Metoprolol Tartrate 75 MG TABS TAKE 1 TABLET BY MOUTH TWICE DAILY. (Patient taking differently: Take 75 mg by mouth 2 (two) times daily. ) 180 tablet 2  . Multiple Vitamin (MULTIVITAMIN WITH MINERALS) TABS tablet Take 1 tablet by mouth daily.     . Omega-3 Fatty Acids  (FISH OIL) 1000 MG CAPS Take 1,000 mg by mouth at bedtime.     . ondansetron (ZOFRAN) 4 MG tablet Take 4 mg  by mouth every 8 (eight) hours as needed for nausea or vomiting.     Marland Kitchen OVER THE COUNTER MEDICATION Take 1 capsule by mouth daily. BEET ROOT     . OVER THE COUNTER MEDICATION Take 2 tablets by mouth 2 (two) times daily. OTC allergy pill    . tiZANidine (ZANAFLEX) 4 MG tablet Take 4 mg by mouth 2 (two) times daily as needed for muscle spasms.    . TRULICITY 1.74 BS/4.9QP SOPN Inject 0.75 mg into the skin once a week.     . vitamin B-12 (CYANOCOBALAMIN) 1000 MCG tablet Take 1,000 mcg by mouth daily.    . Vitamin D, Ergocalciferol, (DRISDOL) 1.25 MG (50000 UNIT) CAPS capsule Take 50,000 Units by mouth once a week.    Marland Kitchen ACCU-CHEK Robin PLUS test strip USE AS DIRECTED 100 each 12  . ACCU-CHEK Robin PLUS test strip USE AS INSTRUCTED 100 strip 12  . atorvastatin (LIPITOR) 20 MG tablet Take 0.5 tablets (10 mg total) by mouth daily. (Patient not taking: Reported on 07/27/2020) 90 tablet 3  . cetirizine (ZYRTEC) 10 MG tablet Take 1 tablet (10 mg total) by mouth daily. (Patient not taking: Reported on 07/27/2020) 30 tablet 1  . glucose blood test strip USE AS INSTRUCTED    . hydrochlorothiazide (HYDRODIURIL) 25 MG tablet Take 25 mg by mouth daily.    . metFORMIN (GLUCOPHAGE) 500 MG tablet Take 1 tablet (500 mg total) by mouth daily with breakfast. (Patient not taking: Reported on 07/27/2020) 30 tablet 6  . montelukast (SINGULAIR) 10 MG tablet TAKE 1 TABLET BY MOUTH AT BEDTIME. (Patient not taking: Reported on 07/27/2020) 90 tablet 2  . oxyCODONE-acetaminophen (PERCOCET) 10-325 MG tablet Take 1 tablet by mouth 2 (two) times daily.  (Patient not taking: Reported on 07/27/2020)    . pregabalin (LYRICA) 25 MG capsule Take 25 mg by mouth 2 (two) times daily. (Patient not taking: Reported on 07/27/2020)    . spironolactone (ALDACTONE) 25 MG tablet Take 1 tablet (25 mg total) by mouth daily. 30 tablet 10  .  valACYclovir (VALTREX) 500 MG tablet Take 500 mg by mouth 2 (two) times daily. (Patient not taking: Reported on 07/27/2020)      Lab Results:  Results for orders placed or performed during the hospital encounter of 07/27/20 (from the past 48 hour(s))  Comprehensive metabolic panel     Status: Abnormal   Collection Time: 07/27/20 12:40 PM  Result Value Ref Range   Sodium 139 135 - 145 mmol/L   Potassium 3.6 3.5 - 5.1 mmol/L   Chloride 98 98 - 111 mmol/L   CO2 28 22 - 32 mmol/L   Glucose, Bld 176 (H) 70 - 99 mg/dL    Comment: Glucose reference range applies only to samples taken after fasting for at least 8 hours.   BUN 18 8 - 23 mg/dL   Creatinine, Ser 0.91 0.44 - 1.00 mg/dL   Calcium 10.4 (H) 8.9 - 10.3 mg/dL   Total Protein 7.8 6.5 - 8.1 g/dL   Albumin 4.2 3.5 - 5.0 g/dL   AST 45 (H) 15 - 41 U/L   ALT 56 (H) 0 - 44 U/L   Alkaline Phosphatase 71 38 - 126 U/L   Total Bilirubin 1.1 0.3 - 1.2 mg/dL   GFR, Estimated >60 >60 mL/min    Comment: (NOTE) Calculated using the CKD-EPI Creatinine Equation (2021)    Anion gap 13 5 - 15    Comment: Performed at [facility], [address] [place], [place] [address]  Ethanol     Status: None   Collection Time: 07/27/20 12:40 PM  Result Value Ref Range   Alcohol, Ethyl (B) <10 <10 mg/dL    Comment: (NOTE) Lowest detectable limit for serum alcohol is 10 mg/dL.  For medical purposes only. Performed at [facility], [place] [address] [place], [place] [address]   Salicylate level     Status: Abnormal   Collection Time: 07/27/20 12:40 PM  Result Value Ref Range   Salicylate Lvl <2.5 (L) 7.0 - 30.0 mg/dL    Comment: Performed at [facility], [place] [address] [place], [place] [address]  Acetaminophen level     Status: Abnormal   Collection Time: 07/27/20 12:40 PM  Result Value Ref Range   Acetaminophen (Tylenol), Serum <10 (L) 10 - 30 ug/mL    Comment: (NOTE) Therapeutic  concentrations vary significantly. A range of 10-30 ug/mL  may be an effective concentration for many patients. However, some  are best treated at concentrations outside of this range. Acetaminophen concentrations >150 ug/mL at 4 hours after ingestion  and >50 ug/mL at 12 hours after ingestion are often associated with  toxic reactions.  Performed at [facility], [place] [address], [place], [place] [address]   cbc     Status: None   Collection Time: 07/27/20 12:40 PM  Result Value Ref Range   WBC 10.3 4.0 - 10.5 K/uL   RBC 4.35 3.87 - 5.11 MIL/uL   Hemoglobin 12.8 12.0 - 15.0 g/dL   HCT 40.9 36 - 46 %   MCV 94.0 80.0 - 100.0 fL   MCH 29.4 26.0 - 34.0 pg   MCHC 31.3 30.0 - 36.0 g/dL   RDW 12.5 11.5 - 15.5 %   Platelets 354 150 - 400 K/uL   nRBC 0.0 0.0 - 0.2 %    Comment: Performed at [facility], [place] [address] [place], [place] [address]  Rapid urine drug screen (hospital performed)     Status: None   Collection Time: 07/27/20  1:17 PM  Result Value Ref Range   Opiates NONE DETECTED NONE DETECTED   Cocaine NONE DETECTED NONE DETECTED   Benzodiazepines NONE DETECTED NONE DETECTED   Amphetamines NONE DETECTED NONE DETECTED   Tetrahydrocannabinol NONE DETECTED NONE DETECTED   Barbiturates NONE DETECTED NONE DETECTED    Comment: (NOTE) DRUG SCREEN FOR MEDICAL PURPOSES ONLY.  IF CONFIRMATION IS NEEDED FOR ANY PURPOSE, NOTIFY LAB WITHIN 5 DAYS.  LOWEST DETECTABLE LIMITS FOR URINE DRUG SCREEN Drug Class                     Cutoff (ng/mL) Amphetamine and metabolites    1000 Barbiturate and metabolites    200 Benzodiazepine                 240 Tricyclics and metabolites     300 Opiates and metabolites        300 Cocaine and metabolites        300 THC                            50 Performed at [facility], [place] [address], [place],  [address]   Respiratory Panel by RT PCR (Flu A&B, Covid) - Nasopharyngeal Swab      Status: None   Collection Time: 07/27/20  6:00 PM   Specimen: Nasopharyngeal  Swab  Result Value Ref Range   SARS Coronavirus 2 by RT PCR NEGATIVE NEGATIVE    Comment: (NOTE) SARS-CoV-2 target nucleic acids are NOT DETECTED.  The SARS-CoV-2 RNA is generally detectable in upper respiratoy specimens during the acute phase of infection. The lowest concentration of SARS-CoV-2 viral copies this assay can detect is 131 copies/mL. A negative result does not preclude SARS-Cov-2 infection and should not be used as the sole basis for treatment or other patient management decisions. A negative result may occur with  improper specimen collection/handling, submission of specimen other than nasopharyngeal swab, presence of viral mutation(s) within the areas targeted by this assay, and inadequate number of viral copies (<131 copies/mL). A negative result must be combined with clinical observations, patient history, and epidemiological information. The expected result is Negative.  Fact Sheet for Patients:  PinkCheek.be  Fact Sheet for Healthcare Providers:  GravelBags.it  This test is no t yet approved or cleared by the [place] FDA and  has been authorized for detection and/or diagnosis of SARS-CoV-2 by FDA under an Emergency Use Authorization (EUA). This EUA will remain  in effect (meaning this test can be used) for the duration of the COVID-19 declaration under Section 564(b)(1) of the Act, 21 U.S.C. section 360bbb-3(b)(1), unless the authorization is terminated or revoked sooner.     Influenza A by PCR NEGATIVE NEGATIVE   Influenza B by PCR NEGATIVE NEGATIVE    Comment: (NOTE) The Xpert Xpress SARS-CoV-2/FLU/RSV assay is intended as an aid in  the diagnosis of influenza from Nasopharyngeal swab specimens and  should not be used as a sole basis for treatment. Nasal washings and  aspirates are unacceptable for Xpert Xpress  SARS-CoV-2/FLU/RSV  testing.  Fact Sheet for Patients: PinkCheek.be  Fact Sheet for Healthcare Providers: GravelBags.it  This test is not yet approved or cleared by the [place] FDA and  has been authorized for detection and/or diagnosis of SARS-CoV-2 by  FDA under an Emergency Use Authorization (EUA). This EUA will remain  in effect (meaning this test can be used) for the duration of the  Covid-19 declaration under Section 564(b)(1) of the Act, 21  U.S.C. section 360bbb-3(b)(1), unless the authorization is  terminated or revoked. Performed at [facility], [place] [address], [place], [place] [address]     Blood Alcohol level:  Lab Results  Component Value Date   ETH <10 07/27/2020    Physical Findings: AIMS:  , ,  ,  ,    CIWA:    COWS:     Musculoskeletal: Unable to assess via telepsych  Psychiatric Specialty Exam: Physical Exam Musculoskeletal:     Cervical back: Normal range of motion.  Neurological:     Mental Status: She is alert and oriented to person, place, and time.     Review of Systems  Psychiatric/Behavioral: Positive for dysphoric mood. Negative for hallucinations, self-injury and suicidal ideas. The patient is not hyperactive.     Blood pressure (!) 156/80, pulse 72, temperature 97.6 F (36.4 C), resp. rate 16, height 5\' 8"  (1.727 m), weight (!) 155.6 kg, SpO2 96 %.Body mass index is 52.15 kg/m.  General Appearance: Casual  Eye Contact:  Good  Speech:  Clear and Coherent and Slow  Volume:  Decreased  Mood:  Depressed and Dysphoric  Affect:  Congruent and Depressed  Thought Process:  Coherent and Descriptions of Associations: Intact  Orientation:  Full (Time, Place, and Person)  Thought Content:  Illogical and as patient endorses HI  towards her husband  Suicidal Thoughts:  No  Homicidal Thoughts:  Yes.  with intent/plan  Memory:  Immediate;   Good Recent;    Good Remote;   Good  Judgement:  Impaired  Insight:  Fair  Psychomotor Activity:  NA  Concentration:  Concentration: Good and Attention Span: Good  Recall:  Good  Fund of Knowledge:  Good  Language:  Good  Akathisia:  NA  Handed:  Right  AIMS (if indicated):     Assets:  Communication Skills Desire for Improvement Housing  ADL's:  Intact  Cognition:  WNL  Sleep:   < 6 hours, improving    Treatment Plan Summary: Daily contact with patient to assess and evaluate symptoms and progress in treatment and Medication management.   Patient continues to present alert and oriented other than delusional thought.  Reviewed labs show urinary tract infection 05/2020.  Another Urinalysis has been ordered but results not in at this time.  Patient has no significant psychiatric history and delusions could be a result of an infection or medical condition related to patients multiple commodities.   Patient has multiple medical commodities and would continue to benefit from geropsychiatric hospitalization for stabilization.  Patient continues to take medications as order and has been calm and cooperative with staff   Medication management: Patient home medications were restarted (see above medication list) Home Psychotropic medications: Wellbutrin XL 300 mg daily Cymbalta 30 mg daily Neurontin 300 mg Qid  Ordered TSH, B12, and an RPR  EKG has also been ordered but results not released at this time.  Starting a antipsychotic may be beneficial to help with delusional thoughts but want to rule out QTc prolongation.    Will continue to rule out other causes of acute delusional symptoms; while seeking psychiatric hospitalization placement. Patient requesting to stay local to [facility] area related to her local support.   Disposition: Recommend psychiatric Inpatient admission when medically cleared.   Faelynn Wynder, NP 07/29/2020, 10:07 AM

## 2020-07-29 NOTE — Progress Notes (Deleted)
Subjective:     Patient ID: Robin [name], female   DOB: [birth year]       MRN: [id number]     Brief patient profile:  37  yobf quit smoking in 1994 seen previously for dyspnea, chronic allergic rhinitis and hemoptysis (felt secondary to erosions from tonsils status post bronchoscopy) - has sensation of globus/ dysphagia dating back at least to 2009   Has seen Dr [name] for pos allergies but does not recall details (even symptoms)      TESTS  PFT 02/09/17: FVC  2.35 L (77%) FEV1 2.06 L (85%) FEV1/FVC 0.88 FEF 25-75 3.32 L (142%) negative bronchodilator response TLC 3.74 L (68%) RV 71% ERV 28% DLCO corrected 71% 08/22/15: FVC 2.32 L (119%) FEV1 1.99 L (121%) FEV1/FVC 0.85 FEF 25-75 2.59 L (118%)  6MWT 02/16/17:  Walked 126 meters / Baseline Sat 99%  On RA / Nadir Sat 99% on RA  METHACHOLINE CHALLENGE TEST (02/24/17):  Normal bronchial hyperreactivity.  IMAGING BARIUM SWALLOW/ESOPHAGRAM 04/30/17 (per radiologist):  Hiatal hernia with mild reflux. Esophageal dysmotility  CT NECK/SOFT TISSUE W/ CONTRAST 04/19/17 (per radiologist): No inflammatory change or foreign body can be seen in the pharynx.  Cervical spondylosis. 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  CT CHEST W/O 01/07/17   No parenchymal nodule, mass, or opacity appreciated. No pleural effusion or thickening. No pericardial effusion. No pathologic mediastinal adenopathy.  CTA CHEST 12/13/15   No pulmonary emboli. No pleural effusion or thickening. No pericardial effusion. No pathologic mediastinal adenopathy. No parenchymal nodule, mass, or opacification.  CARDIAC TTE (09/29/16): LV normal in size with moderate concentric hypertrophy. EF 55-60% with no regional wall motion abnormalities. Indeterminant diastolic function. LA & RA normal in size. RV normal in size and function. No aortic stenosis or regurgitation. Aortic root normal in size. No mitral stenosis or regurgitation. No  significant pulmonic regurgitation. No significant tricuspid regurgitation. No pericardial effusion.  LEFT HEART CATHETERIZATION (11/13/3):  Aortic systolic pressure 944  Diastolic pressure 90  Left ventricular systolic pressure 967  End-diastolic pressure 27 left main coronary artery normal  Left anterior descending artery normal  Left circumflex coronary artery normal  Right coronary artery dominant and normal   Overall estimated ejection fraction greater than 60% without focal wall motion abnormality  MICROBIOLOGY Endobronchial/Endotracheal Brushing 03/08/17: Rare Haemophilus influenza beta lactamase positive  PATHOLOGY Endobronchial/Endotracheal Brushing 03/08/17: No malignancy. Negative for herpes & HPV.  LABS 05/28/17 BNP:  6.1 ESR:  34 TSH:  1.060 Anti-CCP:  6 RF:  <10  02/16/17 IgE: 81 RAST panel: Cockroach 0.2 / D. farinae 0.2 / D. pteronyssinus 0.24   12/23/16 INR: 1.0 PTT: 24.1 Chromatin Ab:  <0.2 Smith Ab: <0.2 DS DNA Ab:  <1 SSA:  <0.2 SSB:  <0.2 Anti-CCP:  <16 SCL-70:  <0.2 ENA RNP Ab: Centromere Ab Screen:  <0.2 Jo-1 Ab:  <0.2  05/12/16 ANA: Negative Rheumatoid factor:  <10     02/08/2018 acute extended ov/Robin [name] re:  acute on chronic cough x decades / establish with me Robin [name] pt) Chief Complaint  Patient presents with  . Acute Visit    Pt c/o cough with yellow, wheezing and SOB x 3 days. She states her chest feels sore when she coughs. She has been using her albuterol inahler 3 x daily on average.   baseline = maint protonix right before first meal then before supper, and singuliar and needing saba  3 x  Times a day but not noct then severe  cough /subj  wheeze/ chest soreness 3 days prior to OV and sob with any activity  rec Prednisone 10 mg take  4 each am x 2 days,   2 each am x 2 days,  1 each am x 2 days and stop  zpak Protonix 40  Mg Take 30- 60 min before your first and last meals of the day  For drainage / throat tickle try take  CHLORPHENIRAMINE  4 mg - take one every 4 hours as needed   Take delsym two tsp every 12 hours and supplement if needed with  tramadol 50 mg up to 2 every 4 hours GERD  Diet   Please schedule a follow up office visit in 4 weeks, sooner if needed  with all medications /inhalers/ solutions in hand so we can verify exactly what you are taking. This includes all medications from all doctors and over the counters      03/08/2018  f/u ov/Robin [name] re:  Cough x decades / did not bring all meds but most of them  Chief Complaint  Patient presents with  . Follow-up    Breathing is unchanged. She is still coughing up yellow sputum. She is using her albuterol inhaler 3 x per wk on average.   Dyspnea:  Even if not coughing doe x room to room  X  Years = MMRC3 = can't walk 100 yards even at a slow pace at a flat grade s stopping due to sob  = stops a lot to shop  Cough: fits of cough are  Sporadic daytime mostly  Sleep: she says fine, husband hears wheezing  SABA use:  Doesn't really help/ note MCT neg  rec Stop corevidol and losartan and instead try bisoprolol 5 mg twice daily  For cough try tessalon 200 mg every 8 hours as needed  For drainage / throat tickle try take CHLORPHENIRAMINE  4 mg - take one every 4 hours as needed   Please see patient coordinator before you leave today  to schedule sinus CT> never done denied by insurance          07/19/2018  Acute extended  ov/Robin [name] re: cough / dysphagia/ globus > 10 y worse since off gabapentin 300 mg x 4 weeks with worse doe also / did not bring updated med calendar as requested  Chief Complaint  Patient presents with  . Acute Visit    Increased SOB and cough x 3-4 wks. Her cough is occ prod with green to yellow sputum.  She also c/o hoarseness.   Dyspnea:   MMRC3 = can't walk 100 yards even at a slow pace at a flat grade s stopping due to sob  - can't do  [employer] Cough:  sporadic Sleeping: sleeps fine bed flat/ 2 pillows  SABA use: none Says  tessalon works the best for cough rec Add back gabapentin 300 mg four times a day as per med calendar Change tessilon to 200 mg up to every 8 hours as needed for cough  and stop delsym If can't stop coughing then add hydrocodone up to every 4 hours if needed  If not better next step is to return to ENT ([facility]) then if not satisfied please return here  with all medications /inhalers/ solutions     05/11/2019  f/u ov/Artem Bunte re: uacs / MO with doe /brought meds but they don't correlate well with med list  Chief Complaint  Patient presents with  . Follow-up    Patient reports that   her sob has increased and she still has a dry cough.   Dyspnea:  Was able to walk at [employer], now using scooter x 2 month Cough: still severe cough day > noct, dry > wet with globus sensatin and hoarseness but still subj "wheezeling"  Sleeping: sleeps ok  in flat bed, several pillows SABA use: no better with saba  02: none  Overt hb despite dexilant / no longer on aciphex though still listed rec Prednisone 10 mg take  4 each am x 2 days,   2 each am x 2 days,  1 each am x 2 days and stop  GERD (REFLUX)   For congestion ok to chlotrabs 4 mg x 2 every hours as needed  We will  schedule ENT eval/ [facility] re your cough    12/04/2019  f/u ov/Robin [name] re: uacs  Chief Complaint  Patient presents with  . Follow-up    Increased cough and congestion x 4 months. She will occ produce some yellow to green sputum. She is using her albuterol inhaler about 2 x per day on average.   Dyspnea:  Uses walker across the room then stops < 25 ft x months  Cough: esp daytime min mucoid Sleeping: on side flat bed 4 pillows  SABA use: 2 different albuterols/ easily confused with details of care  02: none  rec Prednisone 10 mg take  4 each am x 2 days,   2 each am x 2 days,  1 each am x 2 days and stop  Try dexilant 60 mg   Take 30-60 min before first meal of the day and Pepcid ac (famotidine) 20 mg one after supper until return to office   GERD diet  For congestion ok to chlotrabs 4 mg x 2 every hours as needed  Only use your albuterol as a rescue medication to be used if you can't catch your breath  See  ENT eval/ [facility] re your cough and ears   01/15/2020  f/u ov/Robin [name] re: uacs/ ? Allergic rhinitis with pnds (w/u by [facility] previously and see last RAST 2018 ) / did not bring chlortabs but says taking 2 bid Chief Complaint  Patient presents with  . Follow-up    Coughing less since the last visit but she states her SOB and wheezing seem worse. She is using her albuterol inhaler at least 3 x per day.   Dyspnea: room to room really no better p pred / more limited by L hip/knee pain Cough: better p prednisone, some worse since completed Sleeping: flat bed with 4 pillows wakes up to br p 3-4 h and cough and wheeze when walking to BR SABA use: during the day with activity (0% effective technique - see a/p)  02: none  rec Work on inhaler technique:  relax and gently blow all the way out then take a nice smooth deep breath back in, triggering the inhaler at same time you start breathing in.  Rinse and gargle with water when done Try albuterol (ventolin) 15 min before an activity that you know would make you short of breath and see if it makes any difference and if makes none then don't take it after activity unless you can't catch your breath. If your breathing gets worse > Prednisone 10 mg take  4 each am x 2 days,   2 each am x 2 days,  1 each am x 2 days and stop  For drainage / throat tickle try take CHLORPHENIRAMINE  4 mg  (  Chlortab 4mg   at [employer] should be easiest to find in the green box)  take one-two  every 4 hours as needed - available over the counter- may cause drowsiness so start with just a bedtime dose or two and see how you tolerate it before trying in daytime        04/19/2020  f/u ov/Robin [name] re: sob/cough x 2009 / had both covid  shots Chief Complaint  Patient presents with  . Follow-up  Dyspnea: 25 ft  and stops due to sob / knees hurt  Cough: some worse mostly daytime/ smells trigger esp cig smoke/ sense of pnds / resolves with prednisone short term   Sleeping:  30 degrees elevation hob with electric bed does fine unless needs to get up to pee  SABA use: avg once a day  02: none  rec See Dr [name] or her group and let them know prednisone helps your symptoms Please schedule a follow up visit in 3 months with PFTs on return -  but call sooner if needed    07/29/2020  f/u ov/Robin [name] re:  No chief complaint on file.    Dyspnea:  *** Cough: *** Sleeping: *** SABA use: *** 02: ***   No obvious day to day or daytime variability or assoc excess/ purulent sputum or mucus plugs or hemoptysis or cp or chest tightness, subjective wheeze or overt sinus or hb symptoms.   *** without nocturnal  or early am exacerbation  of respiratory  c/o's or need for noct saba. Also denies any obvious fluctuation of symptoms with weather or environmental changes or other aggravating or alleviating factors except as outlined above   No unusual exposure hx or h/o childhood pna/ asthma or knowledge of premature birth.  Current Allergies, Complete Past Medical History, Past Surgical History, Family History, and Social History were reviewed in [employer] record.  ROS  The following are not active complaints unless bolded Hoarseness, sore throat, dysphagia, dental problems, itching, sneezing,  nasal congestion or discharge of excess mucus or purulent secretions, ear ache,   fever, chills, sweats, unintended wt loss or wt gain, classically pleuritic or exertional cp,  orthopnea pnd or arm/hand swelling  or leg swelling, presyncope, palpitations, abdominal pain, anorexia, nausea, vomiting, diarrhea  or change in bowel habits or change in bladder habits, change in stools or change in urine, dysuria, hematuria,  rash, arthralgias, visual complaints, headache, numbness, weakness or ataxia or problems  with walking or coordination,  change in mood or  memory.        No outpatient medications have been marked as taking for the 07/29/20 encounter (Appointment) with Tanda Rockers, MD.                     Objective:   Physical Exam      07/29/2020        *** 04/19/2020         335  01/15/2020         354  12/04/2019           355  05/11/2019           331  07/19/2018       328  03/08/2018            337   02/08/18 (!) 333 lb (151 kg)  01/27/18 (!) 328 lb 9.6 oz (149.1 kg)  12/14/17 (!) 326 lb 6.4 oz (148.1 kg)    Vital signs reviewed  07/29/2020  - Note at rest 02 sats  ***% on ***               Assessment:

## 2020-07-29 NOTE — BH Assessment (Addendum)
[facility] Assessment Progress Note  Per Shuvon Rankin, FNP, this voluntary pt continues to require psychiatric hospitalization at a facility providing specialty geriatric psychiatry at this time.  Please note that Delphia Grates finds that pt meets criteria for IVC if necessary.  The following facilities have been contacted to seek placement for this pt, with results as noted:  Beds available, information sent, decision pending: [facility]  Pt previously referred, decision pending: [facility]  Not referred: [facility] (ADL needs inappropriate for their unit)  Unable to reach: [facility]ana Hoes (left message at 14:42)  At capacity: [facility] (unit currently closed) [facility] (unit currently closed) [facility]   Please note that, given that pt is voluntary, disposition must be discussed with her to be sure that she agree to the plan.  If pt refused referral, please discuss initiating IVC with EDP.  If a facility agrees to accept pt and the plan changes in any way please call the facility to inform them of the change.  Final disposition is pending as of this writing.   Jalene Mullet, [place] Coordinator [phone].

## 2020-07-29 NOTE — Telephone Encounter (Signed)
Robin [name] is calling back about the document faxed over for incontinence supplies/ please advise asap today

## 2020-07-29 NOTE — Progress Notes (Signed)
RN updated. CSW requested that pt's RN please leave a handoff to pt's RN on 10/26 to please call the following to confirm both are arriving at 9:30 am as as scheduled:  1. [facility] IVC Transport at ph: [phone] and:  2. [facility] Dispatcher (Direct Line) at ph: [phone]  CSW will continue to follow for D/C needs.  Alphonse Guild. Mailyn Steichen  MSW, LCSW, LCAS, CCS Transitions of Care Clinical Social Worker Care Coordination Department Ph: [phone]

## 2020-07-30 ENCOUNTER — Encounter: Payer: Medicaid Other | Admitting: Physical Therapy

## 2020-07-30 DIAGNOSIS — I10 Essential (primary) hypertension: Secondary | ICD-10-CM | POA: Diagnosis not present

## 2020-07-30 DIAGNOSIS — R2689 Other abnormalities of gait and mobility: Secondary | ICD-10-CM | POA: Diagnosis not present

## 2020-07-30 DIAGNOSIS — F333 Major depressive disorder, recurrent, severe with psychotic symptoms: Secondary | ICD-10-CM | POA: Diagnosis not present

## 2020-07-30 DIAGNOSIS — E785 Hyperlipidemia, unspecified: Secondary | ICD-10-CM | POA: Diagnosis not present

## 2020-07-30 DIAGNOSIS — Z87891 Personal history of nicotine dependence: Secondary | ICD-10-CM | POA: Diagnosis not present

## 2020-07-30 DIAGNOSIS — G4730 Sleep apnea, unspecified: Secondary | ICD-10-CM | POA: Diagnosis not present

## 2020-07-30 DIAGNOSIS — Z888 Allergy status to other drugs, medicaments and biological substances status: Secondary | ICD-10-CM | POA: Diagnosis not present

## 2020-07-30 DIAGNOSIS — Z79899 Other long term (current) drug therapy: Secondary | ICD-10-CM | POA: Diagnosis not present

## 2020-07-30 DIAGNOSIS — Z8249 Family history of ischemic heart disease and other diseases of the circulatory system: Secondary | ICD-10-CM | POA: Diagnosis not present

## 2020-07-30 DIAGNOSIS — M5442 Lumbago with sciatica, left side: Secondary | ICD-10-CM | POA: Diagnosis not present

## 2020-07-30 DIAGNOSIS — Z91128 Patient's intentional underdosing of medication regimen for other reason: Secondary | ICD-10-CM | POA: Diagnosis not present

## 2020-07-30 DIAGNOSIS — M1990 Unspecified osteoarthritis, unspecified site: Secondary | ICD-10-CM | POA: Diagnosis not present

## 2020-07-30 DIAGNOSIS — F32A Depression, unspecified: Secondary | ICD-10-CM | POA: Diagnosis not present

## 2020-07-30 DIAGNOSIS — M5441 Lumbago with sciatica, right side: Secondary | ICD-10-CM | POA: Diagnosis not present

## 2020-07-30 DIAGNOSIS — Z8261 Family history of arthritis: Secondary | ICD-10-CM | POA: Diagnosis not present

## 2020-07-30 DIAGNOSIS — Z7982 Long term (current) use of aspirin: Secondary | ICD-10-CM | POA: Diagnosis not present

## 2020-07-30 DIAGNOSIS — Z7984 Long term (current) use of oral hypoglycemic drugs: Secondary | ICD-10-CM | POA: Diagnosis not present

## 2020-07-30 DIAGNOSIS — Z6841 Body Mass Index (BMI) 40.0 and over, adult: Secondary | ICD-10-CM | POA: Diagnosis not present

## 2020-07-30 DIAGNOSIS — Z79890 Hormone replacement therapy (postmenopausal): Secondary | ICD-10-CM | POA: Diagnosis not present

## 2020-07-30 DIAGNOSIS — Z90710 Acquired absence of both cervix and uterus: Secondary | ICD-10-CM | POA: Diagnosis not present

## 2020-07-30 DIAGNOSIS — Z833 Family history of diabetes mellitus: Secondary | ICD-10-CM | POA: Diagnosis not present

## 2020-07-30 DIAGNOSIS — S3095XA Unspecified superficial injury of vagina and vulva, initial encounter: Secondary | ICD-10-CM | POA: Diagnosis not present

## 2020-07-30 DIAGNOSIS — E119 Type 2 diabetes mellitus without complications: Secondary | ICD-10-CM | POA: Diagnosis not present

## 2020-07-30 DIAGNOSIS — E039 Hypothyroidism, unspecified: Secondary | ICD-10-CM | POA: Diagnosis not present

## 2020-07-30 LAB — RPR: RPR Ser Ql: NONREACTIVE

## 2020-07-30 NOTE — ED Provider Notes (Signed)
  Physical Exam  BP 139/61 (BP Location: Right Arm)   Pulse 71   Temp 98.2 F (36.8 C) (Oral)   Resp 17   Ht 5\' 8"  (1.727 m)   Wt (!) 155.6 kg   SpO2 97%   BMI 52.15 kg/m   Physical Exam  ED Course/Procedures     Procedures  MDM  Patient pending transfer to [facility].  Reportedly accepted by Dr. Georgia Dom.  When evaluated this morning patient was sleeping comfortably.  He is under IVC from yesterday.       Davonna Belling, MD 07/30/20 [phone]

## 2020-07-30 NOTE — Progress Notes (Signed)
TOC CM/CSW checked in with Diane, RN to verify if all plans for dc were still according to plan or if there was a need for change.  Diane, RN stated things were according to plan and no barriers.    CSW will continue to follow for dc needs.  Thorn Demas Tarpley-Carter, MSW, LCSW-A Pronouns:  She, Her, Hers                  [place] ED Transitions of CareClinical Social Worker [email or web address] [phone]

## 2020-07-30 NOTE — ED Notes (Signed)
Verified the number to the [facility].  Retried and still no answer.

## 2020-07-30 NOTE — ED Notes (Signed)
This Probation officer confirmed with [facility] department that pt will be picked up at 9:30 am today. Pt is calm and cooperative.

## 2020-07-30 NOTE — ED Notes (Signed)
Transported to [facility] under IVC by [facility] with [facility] transport. Belongings given to [facility].  Pt did not want to go but was cooperative.

## 2020-07-30 NOTE — ED Notes (Signed)
Tried to call report to [facility] to the number provided. [phone]. There was no answer and not able to leave message.

## 2020-07-30 NOTE — Progress Notes (Signed)
TOC CM/CSW connected with Jalene Mullet, Counselor in reference to pts dc planning.  Marcello Moores confirmed that planning process was in action with no barriers.  CSW will continue to follow for dc needs.  Elbie Statzer Tarpley-Carter, MSW, LCSW-A Pronouns:  She, Her, [place] ED Transitions of CareClinical Social Worker [email or web address] [phone]

## 2020-07-31 DIAGNOSIS — F32A Depression, unspecified: Secondary | ICD-10-CM | POA: Diagnosis not present

## 2020-08-01 DIAGNOSIS — F32A Depression, unspecified: Secondary | ICD-10-CM | POA: Diagnosis not present

## 2020-08-02 DIAGNOSIS — F32A Depression, unspecified: Secondary | ICD-10-CM | POA: Diagnosis not present

## 2020-08-02 NOTE — Telephone Encounter (Signed)
Did not need this encounter °

## 2020-08-03 DIAGNOSIS — F32A Depression, unspecified: Secondary | ICD-10-CM | POA: Diagnosis not present

## 2020-08-04 DIAGNOSIS — F32A Depression, unspecified: Secondary | ICD-10-CM | POA: Diagnosis not present

## 2020-08-05 DIAGNOSIS — F32A Depression, unspecified: Secondary | ICD-10-CM | POA: Diagnosis not present

## 2020-08-06 ENCOUNTER — Telehealth: Payer: Self-pay

## 2020-08-06 ENCOUNTER — Other Ambulatory Visit: Payer: Self-pay | Admitting: Internal Medicine

## 2020-08-06 DIAGNOSIS — F341 Dysthymic disorder: Secondary | ICD-10-CM

## 2020-08-06 DIAGNOSIS — F4325 Adjustment disorder with mixed disturbance of emotions and conduct: Secondary | ICD-10-CM

## 2020-08-06 DIAGNOSIS — E119 Type 2 diabetes mellitus without complications: Secondary | ICD-10-CM

## 2020-08-06 DIAGNOSIS — F419 Anxiety disorder, unspecified: Secondary | ICD-10-CM

## 2020-08-06 DIAGNOSIS — D352 Benign neoplasm of pituitary gland: Secondary | ICD-10-CM

## 2020-08-06 DIAGNOSIS — F32A Depression, unspecified: Secondary | ICD-10-CM

## 2020-08-06 MED FILL — GABAPENTIN 300 MG CAPSULE: 300 | 30 days supply | Qty: 120 | Fill #2

## 2020-08-06 MED FILL — DULoxetine HCL 30 MG CPEP: 30 | 90 days supply | Qty: 90 | Fill #0

## 2020-08-06 MED FILL — FAMOTIDINE 20 MG TABS: 20 | 30 days supply | Qty: 60 | Fill #2

## 2020-08-06 NOTE — Telephone Encounter (Signed)
Transition Care Management Unsuccessful Follow-up Telephone Call  Date of discharge and from where:  08/05/2020 from [facility]  Attempts:  1st Attempt  Reason for unsuccessful TCM follow-up call:  Left voice message

## 2020-08-06 NOTE — Telephone Encounter (Signed)
Requested Prescriptions  Pending Prescriptions Disp Refills   DULoxetine (CYMBALTA) 30 MG capsule [Pharmacy Med Name: DULoxetine HCL 30 MG CPEP 30 Capsule] 90 capsule 0    Sig: TAKE 1 CAPSULE (30 MG TOTAL) BY MOUTH DAILY.     Psychiatry: Antidepressants - SNRI Failed - 08/06/2020 12:31 PM      Failed - Last BP in normal range    BP Readings from Last 1 Encounters:  07/30/20 (!) 147/66         Passed - Completed PHQ-2 or PHQ-9 in the last 360 days      Passed - Valid encounter within last 6 months    Recent Outpatient Visits          1 month ago No-show for appointment   [place], [place] J, NP   2 months ago Type 2 diabetes mellitus with morbid obesity [facility])   [place], MD   5 months ago Type 2 diabetes mellitus with morbid obesity [facility])   [place], MD   8 months ago Prediabetes   [place], MD   9 months ago Patient left before evaluation by [place], MD      Future Appointments            In 2 weeks Ladell Pier, MD [place]

## 2020-08-07 NOTE — Telephone Encounter (Signed)
Transition Care Management Follow-up Telephone Call  Date of discharge and from where: 08/05/2020 from [facility].  How have you been since you were released from the hospital? Patent stated that she if feeling okay. Erma Dorn (pt [phone]) was also on the call today. She did not seem as informed of patients medications or patients conditions. Erma stated that patient would not have transportation to the appointment that is scheduled for Friday.    Any questions or concerns? Yes Patient is concerned about the DME order. I have a community message for [employer] informing of the NOVANT order and to assist if possible. Next steps are contingent on their return message.    Items Reviewed:  Did the pt receive and understand the discharge instructions provided? No     Medications obtained and verified? Yes There were several medications added and discontinued. Reviewed and updated the list based on what medications patient stated she was taking. Reviewed the discontinued medications with patient. Patient stated that she was very concerned about stopping any of her medications without the direction of Dr. Wynetta Emery. The full list of discontinued medications has been added to the bottom of this note.    Other? Yes Patient is in need of a lot of assistance with ADL's and transportation. Referral has been entered informing of same. Request sent in for Urgent need.   Any new allergies since your discharge? No    Dietary orders reviewed? Yes  Do you have support at home? No   Home Care and Equipment/Supplies:  Patient was not aware that a home health referral was made:   In addition, a referral with orders has been faxed to [facility] [address], [place] [address] [place] [phone], Fax [phone] and Home Health orders have been faxed as well.  Patient has not heard from [place] at time to schedule [facility] evaluation and plan.   Were any  new equipment or medical supplies ordered?  Yes: shower chair, bedside commode and wheel chair What is the name of the medical supply agency? Patient is not sure. Were you able to get the supplies/equipment? Patient stated that she has not been contacted to receive equipment.  Do you have any questions related to the use of the equipment or supplies? Yes: shower chair, wheel chair, bedside commode    Functional Questionnaire: (I = Independent and D = Dependent) ADLs: D Bathing/Dressing- D Meal Prep- D Eating- I Maintaining continence- D Transferring/Ambulation- D Managing Meds- D  Follow up appointments reviewed:  PCP Hospital f/u appt confirmed? Yes  Scheduled to see Dr. Wynetta Emery on 08/20/20 @ 9:10am. Recommendations to physicians: The patient may benefit from attending individual and group psychotherapy. Follow-up on Hepatic and Renal functions, TSH, and Metabolic panel, including HgA1C, lipids Q 3-6 months; monitoring of weight and BP at every visit.    [facility] f/u appt confirmed? Yes  Scheduled to see 08/09/2020 on Psychiatrist in [facility]. Patient is not sure of the name of the facility or the provider.   I was only able to find specialist appointment with [facility] for this Friday 08/09/2020, so there may be some additional confusion.   [facility] 08/09/20@ 9am [address] [place], [place] [address]  Phone: [phone]   Future Appointments   Date Time Provider [facility] (--) [address], [address] [place] [address]  [phone]   09/23/2020 11:15 AM Valentino Nose, MD [facility] None    Are transportation arrangements needed? Yes   If their condition worsens, is the pt aware to call PCP or go to the Emergency Dept.? YES  Was the patient provided with contact information for the PCP's office or ED? YES  Was to pt encouraged to call back with questions or  concerns? YES           DC instructions Reviewed with Patient.   STOP taking these medications   acetaminophen 500 mg tablet Commonly known as: TYLENOL  amitriptyline HCl 50 mg tablet Commonly known as: ELAVIL  buPROPion hcl 300 mg 24 hr tablet Commonly known as: WELLBUTRIN XL  CINNAMON PO  furosemide 40 mg tablet Commonly known as: LASIX  hydrALAZINE HCl 25 mg tablet Commonly known as: APRESOLINE  LINZESS 290 mcg capsule Generic drug: linaclotide  Metoprolol Tartrate 75 MG Tabs tablet  montelukast 10 MG tablet Commonly known as: SINGULAIR  sodium chloride 0.65% nasal spray Commonly known as: ALTAMIST,OCEAN,DEEP SEA NASAL SPRAY

## 2020-08-07 NOTE — Telephone Encounter (Signed)
[employer] stated that they have not received any of the DME orders from the hospital.

## 2020-08-09 ENCOUNTER — Other Ambulatory Visit: Payer: Self-pay | Admitting: *Deleted

## 2020-08-09 NOTE — Patient Outreach (Signed)
Care Coordination  08/09/2020  Robin [name] Feb 08, 1956 [id number]   An unsuccessful telephone outreach was attempted today. The patient was referred to the case management team for assistance with care management and care coordination.   Follow Up Plan: The Managed Medicaid care management team will reach out to the patient again over the next 7 days.   [name] RN, BSN [place]  [employer] manager

## 2020-08-09 NOTE — Patient Instructions (Signed)
Visit Information  [name]  - as a part of your Medicaid benefit, you are eligible for care management and care coordination services at no cost or copay. I was unable to reach you by phone today but would be happy to help you with your health related needs. Please feel free to call me @ [phone].   A member of the Managed Medicaid care management team will reach out to you again over the next 7 days.   [name] RN, BSN [place]  [employer] manager

## 2020-08-13 ENCOUNTER — Encounter: Payer: Medicaid Other | Attending: Family Medicine | Admitting: Physical Therapy

## 2020-08-13 ENCOUNTER — Telehealth: Payer: Self-pay | Admitting: Physical Therapy

## 2020-08-13 DIAGNOSIS — M6281 Muscle weakness (generalized): Secondary | ICD-10-CM | POA: Insufficient documentation

## 2020-08-13 DIAGNOSIS — R278 Other lack of coordination: Secondary | ICD-10-CM | POA: Insufficient documentation

## 2020-08-13 NOTE — Telephone Encounter (Signed)
Called patient about missing her appointment today at 13:00. She reports she just got out of the hospital and not feeling well yet. I made her aware of her next appointment. She reports she will be there.  Earlie Counts, PT @11 /06/2020@ 1:27 PM

## 2020-08-14 ENCOUNTER — Other Ambulatory Visit: Payer: Self-pay

## 2020-08-14 ENCOUNTER — Ambulatory Visit: Payer: Self-pay

## 2020-08-14 NOTE — Patient Outreach (Signed)
Care Coordination- Social Work  08/14/2020  Robin [name] 1956/01/25 [id number]  Subjective:    Robin [name] is an 64 y.o. year old female who is a primary patient of Ladell Pier, MD.    Robin [name] was given information about Medicaid Managed Care team care coordination services today. Robin [name] agreed to services and verbal consent obtained  Review of patient status, laboratory and other test data was performed as part of evaluation for provision of services.  SDOH:   SDOH Screenings   Alcohol Screen: Low Risk   . Last Alcohol Screening Score (AUDIT): 0  Depression (PHQ2-9): Medium Risk  . PHQ-2 Score: 18  Financial Resource Strain: Low Risk   . Difficulty of Paying Living Expenses: Not very hard  Food Insecurity: No Food Insecurity  . Worried About Charity fundraiser in the Last Year: Never true  . Ran Out of Food in the Last Year: Never true  Housing: Low Risk   . Last Housing Risk Score: 0  Physical Activity: Inactive  . Days of Exercise per Week: 0 days  . Minutes of Exercise per Session: 0 min  Social Connections: Socially Integrated  . Frequency of Communication with Friends and Family: More than three times a week  . Frequency of Social Gatherings with Friends and Family: More than three times a week  . Attends Religious Services: More than 4 times per year  . Active Member of Clubs or Organizations: Yes  . Attends Archivist Meetings: More than 4 times per year  . Marital Status: Married  Stress: No Stress Concern Present  . Feeling of Stress : Only a little  Tobacco Use: Medium Risk  . Smoking Tobacco Use: Former Smoker  . Smokeless Tobacco Use: Never Used  Transportation Needs: Unmet Transportation Needs  . Lack of Transportation (Medical): Yes  . Lack of Transportation (Non-Medical): Yes   SDOH Interventions     Most Recent Value  SDOH Interventions  SDOH Interventions for the Following Domains Depression, Transportation  Food  Insecurity Interventions Intervention Not Indicated  Financial Strain Interventions Intervention Not Indicated  Housing Interventions Intervention Not Indicated  Intimate Partner Violence Interventions Intervention Not Indicated  Physical Activity Interventions Other (Comments)  [Patient is not very mobile due to arthritis all over her body.]  Stress Interventions Intervention Not Indicated  Social Connections Interventions Intervention Not Indicated  Transportation Interventions Other (Comment)  [Information forwarded to patient regarding transportation through her Medicaid plan.]  Alcohol Brief Interventions/Follow-up Patient Refused  Depression Interventions/Treatment  Referral to Psychiatry, Counseling      Objective:    Medications:  Medications Reviewed Today    Reviewed by Netta Neat, LCSW (Social Worker) on 08/14/20 at [place] List Status: <None>  Medication Order Taking? Sig Documenting Provider Last Dose Status Informant  ACCU-CHEK AVIVA PLUS test strip [id number] Yes USE AS DIRECTED Ladell Pier, MD Taking Active Multiple Informants  ACCU-CHEK AVIVA PLUS test strip [id number] Yes USE AS INSTRUCTED Ladell Pier, MD Taking Active Multiple Informants  acetaminophen (TYLENOL) 500 MG tablet [id number] Yes Take 1,000 mg by mouth 2 (two) times daily.  [provider] Taking Active Multiple Informants  albuterol (VENTOLIN HFA) 108 (90 Base) MCG/ACT inhaler [id number] Yes Inhale 1-2 puffs into the lungs every 6 (six) hours as needed for wheezing or shortness of breath. Orvan July, NP Taking Active Multiple Informants  amitriptyline (ELAVIL) 50 MG tablet [id number] Yes TAKE 1 TABLET BY MOUTH AT BEDTIME.  Patient taking differently: Take 50 mg by mouth at bedtime.    Ladell Pier, MD Taking Active Multiple Informants  aspirin EC 81 MG tablet [id number] Yes Take 81 mg by mouth at bedtime. [provider] Taking Active Multiple Informants    atorvastatin (LIPITOR) 40 MG tablet [id number] Yes Take 40 mg by mouth daily. [provider] Taking Active Multiple Informants  azelastine (ASTELIN) 0.1 % nasal spray [id number] Yes 1-2 sprays per nostril 2 times daily as needed  Patient taking differently: Place 1 spray into both nostrils 2 (two) times daily.    Bobbitt, Sed[place] Muta, MD Taking Active Multiple Informants  buPROPion (WELLBUTRIN XL) 300 MG 24 hr tablet [id number] Yes TAKE 1 TABLET (300 MG TOTAL) BY MOUTH EVERY MORNING.  Patient taking differently: Take 300 mg by mouth daily.    Ladell Pier, MD Taking Active Multiple Informants  CALCIUM CARBONATE ANTACID PO [id number] Yes Take 2 tablets by mouth See admin instructions. Take 2 tablets by mouth every morning, may also take 2 tablets at night as needed for acid reflux [provider] Taking Active Multiple Informants  celecoxib (CELEBREX) 100 MG capsule [id number] Yes Take 1 capsule (100 mg total) by mouth 2 (two) times daily. Ladell Pier, MD Taking Active Multiple Informants  CINNAMON PO [id number] Yes Take 1 capsule by mouth daily. [provider] Taking Active Multiple Informants  conjugated estrogens (PREMARIN) vaginal cream [id number] Yes Place 1 Applicatorful vaginally daily as needed (vaginal dryness/itching). [provider] Taking Active Multiple Informants  diclofenac sodium (VOLTAREN) 1 % GEL [id number] No Apply 2-4 g topically 4 (four) times daily as needed (pain).   Patient not taking: Reported on 08/14/2020   [provider] Not Taking Active Multiple Informants           Med Note Payton Doughty   Sat Jul 27, 2020  4:51 PM) Needs refill  DULoxetine (CYMBALTA) 30 MG capsule [id number] Yes TAKE 1 CAPSULE (30 MG TOTAL) BY MOUTH DAILY. Ladell Pier, MD Taking Active   esomeprazole (NEXIUM) 40 MG capsule [id number] Yes Take 1 capsule (40 mg total) by mouth 2 (two) times daily before a meal.  Patient taking  differently: Take 40 mg by mouth daily.    Mahala Menghini, PA-C Taking Active Multiple Informants  famotidine (PEPCID) 20 MG tablet [id number] Yes Take 1 tablet (20 mg total) by mouth 2 (two) times daily as needed for heartburn or indigestion.  Patient taking differently: Take 20 mg by mouth 2 (two) times daily.    Mahala Menghini, PA-C Taking Active Multiple Informants  fluticasone (FLONASE) 50 MCG/ACT nasal spray [id number] Yes Place 1 spray into both nostrils daily.  Patient taking differently: Place 1 spray into both nostrils daily as needed for allergies or rhinitis.    Orvan July, NP Taking Active Multiple Informants  fluticasone (FLOVENT HFA) 220 MCG/ACT inhaler [id number] Yes 2 inhalations twice daily  Patient taking differently: Inhale 2 puffs into the lungs 2 (two) times daily as needed (shortness of breath/wheezing).    Bobbitt, Sed[place] Muta, MD Taking Active Multiple Informants           Med Note Payton Doughty   BHA Jul 27, 2020  4:36 PM) Pt states that she only took once - when she could not find her albuterol  furosemide (LASIX) 40 MG tablet [id number] Yes TAKE 1/2 TO 1 TABLET BY MOUTH DAILY FOR LOWER EXTREMITY SWELLING  Patient taking differently: Take 20 mg by mouth  daily.    Ladell Pier, MD Taking Active Multiple Informants  gabapentin (NEURONTIN) 300 MG capsule [id number] Yes TAKE 1 CAPSULE (300 MG TOTAL) BY MOUTH 4 (FOUR) TIMES DAILY.  Patient taking differently: Take 600 mg by mouth 2 (two) times daily.    Tanda Rockers, MD Taking Active Multiple Informants           Med Note Payton Doughty   GEX Jul 27, 2020  4:33 PM) Pt states that MD changed to lyrica but that was too expensive - would like refill on gabapentin.  glucose blood test strip [id number] Yes USE AS INSTRUCTED [provider] Taking Active Multiple Informants  hydrALAZINE (APRESOLINE) 50 MG tablet [id number] Yes Take 1 tablet (50 mg total) by mouth 3 (three) times daily.  Patient taking  differently: Take 50 mg by mouth 2 (two) times daily.    Erlene Quan, PA-C Taking Active Multiple Informants  hydrochlorothiazide (HYDRODIURIL) 25 MG tablet [id number] Yes Take 25 mg by mouth daily. [provider] Taking Active Multiple Informants           Med Note Payton Doughty   VZD Jul 27, 2020  4:44 PM) #90 filled 07/09/2020 per external pharmacy records - pt is not sure if she is taking this or not.  levothyroxine (SYNTHROID) 88 MCG tablet [id number] Yes TAKE 1 TABLET (88 MCG TOTAL) BY MOUTH DAILY. Ladell Pier, MD Taking Active Multiple Informants  LINZESS 290 MCG CAPS capsule [id number] Yes TAKE 1 CAPSULE BY MOUTH DAILY BEFORE BREAKFAST.  Patient taking differently: Take 290 mcg by mouth daily.    Carlis Stable, NP Taking Active Multiple Informants  losartan (COZAAR) 25 MG tablet [id number] Yes Take 25 mg by mouth daily. [provider] Taking Active Multiple Informants  metFORMIN (GLUCOPHAGE) 1000 MG tablet [id number] Yes Take 1,000 mg by mouth 2 (two) times daily. [provider] Taking Active Multiple Informants  Metoprolol Tartrate 75 MG TABS [id number] Yes TAKE 1 TABLET BY MOUTH TWICE DAILY.  Patient taking differently: Take 75 mg by mouth 2 (two) times daily.    Ladell Pier, MD Taking Active Multiple Informants  montelukast (SINGULAIR) 10 MG tablet [id number] Yes TAKE 1 TABLET BY MOUTH AT BEDTIME. Ladell Pier, MD Taking Active Multiple Informants           Med Note Payton Doughty   DVV Jul 27, 2020  4:40 PM) Pt states she is only taking OTC allergy medication  Multiple Vitamin (MULTIVITAMIN WITH MINERALS) TABS tablet [id number] Yes Take 1 tablet by mouth daily.  [provider] Taking Active Multiple Informants  Omega-3 Fatty Acids (FISH OIL) 1000 MG CAPS [id number] Yes Take 1,000 mg by mouth at bedtime.  [provider] Taking Active Multiple Informants  ondansetron (ZOFRAN) 4 MG tablet [id number] No Take 4 mg by  mouth every 8 (eight) hours as needed for nausea or vomiting.   Patient not taking: Reported on 08/14/2020   [provider] Not Taking Active Multiple Informants  OVER THE COUNTER MEDICATION [id number] Yes Take 1 capsule by mouth daily. BEET ROOT  [provider] Taking Active Multiple Informants  pregabalin (LYRICA) 25 MG capsule [id number] No Take 25 mg by mouth 2 (two) times daily.  Patient not taking: Reported on 07/27/2020   [provider] Not Taking Active Multiple Informants           Med Note Payton Doughty   Sat Jul 27, 2020  4:41 PM) Pt  states that she could not afford the cost of this medication - would like RX for gabapentin  risperiDONE (RISPERDAL) 2 MG tablet [id number] Yes Take by mouth. [provider] Taking Active   tiZANidine (ZANAFLEX) 4 MG tablet [id number] Yes Take 4 mg by mouth 2 (two) times daily as needed for muscle spasms. [provider] Taking Active Multiple Informants  TRULICITY 6.14 ER/1.5QM SOPN [id number] Yes Inject 0.75 mg into the skin once a week.  [provider] Taking Active Multiple Informants           Med Note Payton Doughty   DTO Jul 27, 2020  4:34 PM) New med 07/24/2020 - not yet taken  valACYclovir (VALTREX) 500 MG tablet [id number] No Take 500 mg by mouth 2 (two) times daily.  Patient not taking: Reported on 07/27/2020   [provider] Not Taking Active Multiple Informants           Med Note Payton Doughty   XIP Jul 27, 2020  4:46 PM) 7 day course filled 03/17/2020 and 04/09/2020  vitamin B-12 (CYANOCOBALAMIN) 1000 MCG tablet [id number] Yes Take 1,000 mcg by mouth daily. [provider] Taking Active Multiple Informants  Vitamin D, Ergocalciferol, (DRISDOL) 1.25 MG (50000 UNIT) CAPS capsule [id number] Yes Take 50,000 Units by mouth once a week. [provider] Taking Active Multiple Informants           Med Note Payton Doughty   KWI Jul 27, 2020  4:35 PM) New  med 07/24/2020 - not yet taken          Fall/Depression Screening:  Fall Risk  07/28/2019 01/11/2019 04/11/2018  Falls in the past year? 1 0 No  Number falls in past yr: 1 - -  Comment 6 - -  Injury with Fall? 0 - -  Follow up Education provided - -   [facility] 2/9 Scores 08/14/2020 07/27/2020 05/21/2020 03/07/2020 11/13/2019 11/03/2019 07/28/2019  PHQ - 2 Score 6 5 5 5 6 4 6   PHQ- 9 Score 18 18 16 17 16 15 19     Assessment:  Goals Addressed            This Visit's Progress   . Psychiatry and therapy treatment for depression       CARE PLAN ENTRY Medicaid Managed Care (see longitudinal plan of care for additional care plan information)  Current Barriers:  . 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 . Transportation - Due to husband's treatment, he is not always available to transport patient to her appointments. . Mental Health Concerns  . Patient's husband has cancer and is currently receiving treatment at the [place] in [place]. . Suicidal Ideation/Homicidal Ideation: No  Clinical Social Work Goal(s):  Marland Kitchen Over the next 60 days, patient will work with SW bi-weekly by telephone or in person to reduce or manage symptoms related to depression. . Over the next 30 days, patient will attend all scheduled mental health appointments: as scheduled. Patient will arrange transportation needs in the appropriate time frame to ensure she has transportation for scheduled appointments.  . Over the next 30 days, patient will demonstrate improved health management independence as evidenced by symptom management.   Interventions: . Patient interviewed and appropriate assessments performed: PHQ 9 . Patient interviewed and appropriate assessments performed . Provided patient with information about depression and physical illness. . Advised patient to discuss outpatient therapy with psychiatrist. Patient is scheduled for psychiatry at [facility] on  08/15/2020. LCSW  encouraged patient to discuss therapy needs with the psychiatrist during this appointment so that she can receive both psychiatry and outpatient therapy at the same agency.  . Begin therapy and attend scheduled appointments. Marland Kitchen Emotional/Supportive Counseling  Patient Self Care Activities:  . Self administers medications as prescribed . Attends church or other social activities . Ability for insight . Motivation for treatment . Strong family or social support  Patient Coping Strengths:  . Supportive Relationships . Family . Friends . Church . [employer] . Spirituality . Hopefulness  Patient Self Care Deficits:  . Unable to independently get around without assistance due to arthritis all over her body. . Unable to perform ADLs independently . Unable to perform IADLs independently . Husband has cancer. . Transportation issues due to husband receiving treatment for cancer.   Initial goal documentation        Plan: A member of the Managed [facility] Care Team will follow up with patient in 7 days.  LCSW will follow up with patient on 08/28/2020 @ 1:30pm.  Netta Neat, BSW, MSW, [place]: [phone]

## 2020-08-14 NOTE — Patient Instructions (Signed)
Visit Information Robin [name], it was a pleasure speaking with you today. Please remember that members of the Managed Medicaid Care Team will be contacting you to address areas of concern that were identified today. If you have any questions or concerns for me, please do not hesitate to contact me at [phone].   Robin [name] was given information about Medicaid Managed Care team care coordination services as a part of their Healthy [facility] Medicaid benefit. Robin [name] verbally consented to engagement with the [facility] Managed Care team.   For questions related to your Healthy [facility] health plan, please call: [phone] or visit the homepage here: [email or web address]  If you would like to schedule transportation through your Healthy [facility] plan, please call the following number at least 2 days in advance of your appointment: [phone]  Goals Addressed            This Visit's Progress   . Psychiatry and therapy treatment for depression       CARE PLAN ENTRY Medicaid Managed Care (see longitudinal plan of care for additional care plan information)  Current Barriers:  . 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 . Transportation - Due to husband's treatment, he is not always available to transport patient to her appointments. . Mental Health Concerns  . Patient's husband has cancer and is currently receiving treatment at the [place] in [employer] [place]. . Suicidal Ideation/Homicidal Ideation: No  Clinical Social Work Goal(s):  Marland Kitchen Over the next 60 days, patient will work with SW bi-weekly by telephone or in person to reduce or manage symptoms related to depression. . Over the next 30 days, patient will attend all scheduled mental health appointments: as scheduled. Patient will arrange transportation needs in the appropriate time frame to ensure she has transportation for scheduled  appointments.  . Over the next 30 days, patient will demonstrate improved health management independence as evidenced by symptom management.   Interventions: . Patient interviewed and appropriate assessments performed: PHQ 9 . Patient interviewed and appropriate assessments performed . Provided patient with information about depression and physical illness. . Advised patient to discuss outpatient therapy with psychiatrist. Patient is scheduled for psychiatry at [facility] on 08/15/2020. LCSW encouraged patient to discuss therapy needs with the psychiatrist during this appointment so that she can receive both psychiatry and outpatient therapy at the same agency.  . Begin therapy and attend scheduled appointments. Marland Kitchen Emotional/Supportive Counseling  Patient Self Care Activities:  . Self administers medications as prescribed . Attends church or other social activities . Ability for insight . Motivation for treatment . Strong family or social support  Patient Coping Strengths:  . Supportive Relationships . Family . Friends . Church . [employer] . Spirituality . Hopefulness  Patient Self Care Deficits:  . Unable to independently get around without assistance due to arthritis all over her body. . Unable to perform ADLs independently . Unable to perform IADLs independently . Husband has cancer. . Transportation issues due to husband receiving treatment for cancer.   Initial goal documentation        Please see education materials related to living with depression provided below.   Living With Depression Everyone experiences occasional disappointment, sadness, and loss in their lives. When you are feeling down, blue, or sad for at least 2 weeks in a row, it may mean that you have depression. Depression can affect your thoughts and feelings, relationships, daily activities, and physical health. It is caused by changes in the  way your brain functions. If you receive a diagnosis  of depression, your health care provider will tell you which type of depression you have and what treatment options are available to you. If you are living with depression, there are ways to help you recover from it and also ways to prevent it from coming back. How to cope with lifestyle changes Coping with stress     Stress is your body's reaction to life changes and events, both good and bad. Stressful situations may include:  Getting married.  The death of a spouse.  Losing a job.  Retiring.  Having a baby. Stress can last just a few hours or it can be ongoing. Stress can play a major role in depression, so it is important to learn both how to cope with stress and how to think about it differently. Talk with your health care provider or a counselor if you would like to learn more about stress reduction. He or she may suggest some stress reduction techniques, such as:  Music therapy. This can include creating music or listening to music. Choose music that you enjoy and that inspires you.  Mindfulness-based meditation. This kind of meditation can be done while sitting or walking. It involves being aware of your normal breaths, rather than trying to control your breathing.  Centering prayer. This is a kind of meditation that involves focusing on a spiritual word or phrase. Choose a word, phrase, or sacred image that is meaningful to you and that brings you peace.  Deep breathing. To do this, expand your stomach and inhale slowly through your nose. Hold your breath for 3-5 seconds, then exhale slowly, allowing your stomach muscles to relax.  Muscle relaxation. This involves intentionally tensing muscles then relaxing them. Choose a stress reduction technique that fits your lifestyle and personality. Stress reduction techniques take time and practice to develop. Set aside 5-15 minutes a day to do them. Therapists can offer training in these techniques. The training may be covered by some  insurance plans. Other things you can do to manage stress include:  Keeping a stress diary. This can help you learn what triggers your stress and ways to control your response.  Understanding what your limits are and saying no to requests or events that lead to a schedule that is too full.  Thinking about how you respond to certain situations. You may not be able to control everything, but you can control how you react.  Adding humor to your life by watching funny films or TV shows.  Making time for activities that help you relax and not feeling guilty about spending your time this way.  Medicines Your health care provider may suggest certain medicines if he or she feels that they will help improve your condition. Avoid using alcohol and other substances that may prevent your medicines from working properly (may interact). It is also important to:  Talk with your pharmacist or health care provider about all the medicines that you take, their possible side effects, and what medicines are safe to take together.  Make it your goal to take part in all treatment decisions (shared decision-making). This includes giving input on the side effects of medicines. It is best if shared decision-making with your health care provider is part of your total treatment plan. If your health care provider prescribes a medicine, you may not notice the full benefits of it for 4-8 weeks. Most people who are treated for depression need to be on medicine  for at least 6-12 months after they feel better. If you are taking medicines as part of your treatment, do not stop taking medicines without first talking to your health care provider. You may need to have the medicine slowly decreased (tapered) over time to decrease the risk of harmful side effects. Relationships Your health care provider may suggest family therapy along with individual therapy and drug therapy. While there may not be family problems that are causing you  to feel depressed, it is still important to make sure your family learns as much as they can about your mental health. Having your family's support can help make your treatment successful. How to recognize changes in your condition Everyone has a different response to treatment for depression. Recovery from major depression happens when you have not had signs of major depression for two months. This may mean that you will start to:  Have more interest in doing activities.  Feel less hopeless than you did 2 months ago.  Have more energy.  Overeat less often, or have better or improving appetite.  Have better concentration. Your health care provider will work with you to decide the next steps in your recovery. It is also important to recognize when your condition is getting worse. Watch for these signs:  Having fatigue or low energy.  Eating too much or too little.  Sleeping too much or too little.  Feeling restless, agitated, or hopeless.  Having trouble concentrating or making decisions.  Having unexplained physical complaints.  Feeling irritable, angry, or aggressive. Get help as soon as you or your family members notice these symptoms coming back. How to get support and help from others How to talk with friends and family members about your condition  Talking to friends and family members about your condition can provide you with one way to get support and guidance. Reach out to trusted friends or family members, explain your symptoms to them, and let them know that you are working with a health care provider to treat your depression. Financial resources Not all insurance plans cover mental health care, so it is important to check with your insurance carrier. If paying for co-pays or counseling services is a problem, search for a local or county mental health care center. They may be able to offer public mental health care services at low or no cost when you are not able to see a  private health care provider. If you are taking medicine for depression, you may be able to get the generic form, which may be less expensive. Some makers of prescription medicines also offer help to patients who cannot afford the medicines they need. Follow these instructions at home:   Get the right amount and quality of sleep.  Cut down on using caffeine, tobacco, alcohol, and other potentially harmful substances.  Try to exercise, such as walking or lifting small weights.  Take over-the-counter and prescription medicines only as told by your health care provider.  Eat a healthy diet that includes plenty of vegetables, fruits, whole grains, low-fat dairy products, and lean protein. Do not eat a lot of foods that are high in solid fats, added sugars, or salt.  Keep all follow-up visits as told by your health care provider. This is important. Contact a health care provider if:  You stop taking your antidepressant medicines, and you have any of these symptoms: ? Nausea. ? Headache. ? Feeling lightheaded. ? Chills and body aches. ? Not being able to sleep (insomnia).  You or your friends and family think your depression is getting worse. Get help right away if:  You have thoughts of hurting yourself or others. If you ever feel like you may hurt yourself or others, or have thoughts about taking your own life, get help right away. You can go to your nearest emergency department or call:  Your local emergency services (911 in the [place]  A suicide crisis helpline, such as the [place] at 208-075-6252. This is open 24-hours a day. Summary  If you are living with depression, there are ways to help you recover from it and also ways to prevent it from coming back.  Work with your health care team to create a management plan that includes counseling, stress management techniques, and healthy lifestyle habits. This information is not intended to replace  advice given to you by your health care provider. Make sure you discuss any questions you have with your health care provider. Document Revised: 01/13/2019 Document Reviewed: 08/24/2016 Elsevier Patient Education  [employer].   The patient verbalized understanding of instructions provided today and agreed to receive a mailed copy of patient instruction and/or educational materials.  Licensed Clinical Social Worker will follow up on August 28, 2020 @ 1:30pm. The Managed Medicaid care management team will reach out to the patient again over the next 7 days.  The patient has been provided with contact information for the Managed Medicaid care management team and has been advised to call with any health related questions or concerns.    Netta Neat, BSW, MSW, LCSW Social Work Case Freight forwarder - [place]  [employer] [place]: [phone]

## 2020-08-15 ENCOUNTER — Telehealth (INDEPENDENT_AMBULATORY_CARE_PROVIDER_SITE_OTHER): Payer: Self-pay | Admitting: Internal Medicine

## 2020-08-15 NOTE — Telephone Encounter (Signed)
Robin [name] would you be able to contact pt and assist her

## 2020-08-15 NOTE — Telephone Encounter (Signed)
Copied from [place] [id number]. Topic: General - Other >> Aug 15, 2020 11:15 AM Keene Breath wrote: Reason for CRM: Patient would like the nurse to call her to get a list of all the medications that I have taken for pain.  Patient needs this for her disability claim.  Please call patient to discuss at [phone]

## 2020-08-15 NOTE — Telephone Encounter (Signed)
Call placed to patient. She is requesting a list of medications used for pain control for a disability claim. I attempted to cover these with her but she claims she needs a list from Dr. Wynetta Emery. She does have an OV planned for next week. She will discuss this with Dr. Wynetta Emery in clinic. She asked if I would send this message to Dr. Wynetta Emery so "she'll know what I'm talking about". I informed her that I would forward the message.

## 2020-08-16 ENCOUNTER — Other Ambulatory Visit: Payer: Self-pay | Admitting: *Deleted

## 2020-08-16 NOTE — Patient Outreach (Signed)
Care Coordination  08/16/2020  Robin [name] May 02, 1956 [id number]   A second unsuccessful telephone outreach was attempted today. The patient was referred to the case management team for assistance with care management and care coordination.   Follow Up Plan: The Managed Medicaid care management team will reach out to the patient again over the next 7-14 days.   [name] RN, BSN [place]  [employer] manager

## 2020-08-16 NOTE — Patient Instructions (Signed)
Visit Information  [name]  - as a part of your Medicaid benefit, you are eligible for care management and care coordination services at no cost or copay. I was unable to reach you by phone today but would be happy to help you with your health related needs. Please feel free to call me @[phone].   A member of the Managed Medicaid care management team will reach out to you again over the next 7-14 days.   [name] RN, BSN [place]  [employer] manager

## 2020-08-20 ENCOUNTER — Encounter: Payer: Medicaid Other | Admitting: Physical Therapy

## 2020-08-20 ENCOUNTER — Other Ambulatory Visit: Payer: Self-pay

## 2020-08-20 ENCOUNTER — Encounter: Payer: Self-pay | Admitting: Physical Therapy

## 2020-08-20 ENCOUNTER — Ambulatory Visit: Payer: Medicaid Other | Admitting: Internal Medicine

## 2020-08-20 DIAGNOSIS — M6281 Muscle weakness (generalized): Secondary | ICD-10-CM | POA: Diagnosis not present

## 2020-08-20 DIAGNOSIS — R278 Other lack of coordination: Secondary | ICD-10-CM

## 2020-08-20 NOTE — Patient Instructions (Addendum)
Urge Incontinence  . Ideal urination frequency is every 2-4 wakeful hours, which equates to 5-8 times within a 24-hour period.   . Urge incontinence is leakage that occurs when the bladder muscle contracts, creating a sudden need to go before getting to the bathroom.   . Going too often when your bladder isn't actually full can disrupt the body's automatic signals to store and hold urine longer, which will increase urgency/frequency.  In this case, the bladder "is running the show" and strategies can be learned to retrain this pattern.   . One should be able to control the first urge to urinate, at around 112mL.  The bladder can hold up to a "grande latte," or 454mL. . To help you gain control, practice the Urge Drill below when urgency strikes.  This drill will help retrain your bladder signals and allow you to store and hold urine longer.  The overall goal is to stretch out your time between voids to reach a more manageable voiding schedule.    . Practice your "quick flicks" often throughout the day (each waking hour) even when you don't need feel the urge to go.  This will help strengthen your pelvic floor muscles, making them more effective in controlling leakage.  Urge Drill  When you feel an urge to go, follow these steps to regain control: 1. Stop what you are doing and be still 2. Take one deep breath, directing your air into your abdomen 3. Think an affirming thought, such as "I've got this." 4. Do 5 quick flicks of your pelvic floor, then count backwards from 10-1 5. Walk with control to the bathroom to void, or delay voiding 6. If you get the urge again while going to he bathroom,  start from #1    Earlie Counts, PT [facility] [address], [place], [place] [address] W: [phone] [email or web address]

## 2020-08-20 NOTE — Therapy (Addendum)
[place] at [facility] [address], [place], [place], [address] Phone: [phone]   Fax:  [phone]  Physical Therapy Treatment  Patient Details  Name: Robin [name] MRN: [id number] Date of Birth: 12-21-1955 Referring Provider (PT): Dr. Darron Doom   Encounter Date: 08/20/2020   PT End of Session - 08/20/20 1321    Visit Number 7    Date for PT Re-Evaluation 10/15/20    Authorization Type [place] [employer]    Authorization - Visit Number 7    Authorization - Number of Visits 27    PT Start Time 1310    PT Stop Time 1355    PT Time Calculation (min) 45 min    Activity Tolerance Patient tolerated treatment well    Behavior During Therapy [facility] for tasks assessed/performed           Past Medical History:  Diagnosis Date  . Anxiety disorder   . Arthritis   . Asthma   . Chronic neck pain   . Constipation   . Depression   . Diabetes mellitus without complication ([place])   . Dyspnea   . GERD (gastroesophageal reflux disease)   . Hiatal hernia    small  . Hyperlipidemia   . Hypertension   . Hypothyroidism   . Morbid obesity with BMI of 50.0-59.9, adult ([place])   . Schatzki's ring    non critical  . Sinusitis   . Sleep apnea    CPAP, Sleep study at [place]  . Sleep apnea     Past Surgical History:  Procedure Laterality Date  . ABDOMINAL HYSTERECTOMY    . ABDOMINAL HYSTERECTOMY  02/18/2000  . CARDIAC CATHETERIZATION  08/18/2003   normal L main/LAD/L Cfx/RCA (Dr. Adora Fridge)  . COLONOSCOPY  2006   Dr. [name], hyperplastic polyps  . COLONOSCOPY  08/06/11   abnormal terminal ileum for 10cm, erosions, geographical ulceration. Bx small bowel mucosa with prominent intramucosal lymphoid aggregates, slightly inflammed  . COLONOSCOPY WITH PROPOFOL N/A 12/28/2019   Rourk: 4 sessile serrated adenomas removed on the colon.  Next colonoscopy in 3 years.  Marland Kitchen DIRECT LARYNGOSCOPY  03/15/2012    Procedure: DIRECT LARYNGOSCOPY;  Surgeon: [name], MD;  Location: [facility];  Service: ENT;  Laterality: N/A; Dr. Wolicki--:>no foreign body seen. normal esophagus to 40cm  . ESOPHAGEAL DILATION  04/17/2015   Procedure: ESOPHAGEAL DILATION;  Surgeon: [name], MD;  [facility];  Service: Endoscopy;;  . ESOPHAGOGASTRODUODENOSCOPY  08/23/2008   ENM:MHWK distal esophageal erosions consistent with mild erosive reflux esophagitis, otherwise unremarkable esophagus/ Tiny antral erosions of doubtful clinical significance, otherwise normal stomach, patent pylorus, normal D1 and D2  . ESOPHAGOGASTRODUODENOSCOPY  08/06/11   small hh, noncritical Schatzki's ring s/p 51 F  . ESOPHAGOGASTRODUODENOSCOPY N/A 04/17/2015   GSU:PJSRPR s/p dilation  . ESOPHAGOGASTRODUODENOSCOPY (EGD) WITH PROPOFOL N/A 01/20/2018   Dr. [name]: Erosive gastropathy, normal-appearing esophagus status post empiric dilation.  Chronic gastritis, no H. pylori.  . ESOPHAGOSCOPY  03/15/2012   Procedure: ESOPHAGOSCOPY;  Surgeon: [name], MD;  Location: [facility];  Service: ENT;  Laterality: N/A;  . KNEE ARTHROSCOPY  04/27/2001  . MALONEY DILATION N/A 01/20/2018   Procedure: Venia Minks DILATION;  Surgeon: [name], MD;  [facility];  Service: Endoscopy;  Laterality: N/A;  . NM MYOCAR PERF WALL MOTION  2003   negative bruce protocol exercise tress test; EF 68%; intermediate risk study due to evidence  of anterior wall ischemia extending from mid-ventricle to apex  . PITUITARY SURGERY  06/2012   benign tumor, surgeon at [facility]  . POLYPECTOMY  12/28/2019   Procedure: POLYPECTOMY;  Surgeon: [name], MD;  [facility];  Service: Endoscopy;;  . RECTOCELE REPAIR    . TRANSTHORACIC ECHOCARDIOGRAM  2003   EF normal  . VAGINAL PROLAPSE REPAIR    . VIDEO BRONCHOSCOPY Bilateral 03/08/2017   Procedure: VIDEO BRONCHOSCOPY WITHOUT FLUORO;  Surgeon: Javier Glazier, MD;  Location: [facility];  Service:  Cardiopulmonary;  Laterality: Bilateral;    There were no vitals filed for this visit.   Subjective Assessment - 08/20/20 1322    Subjective Patient was in the hospital for a nervous break down. Patient was in the hospital for 7 days. My incontinence has not been doing good due to not feeling well. I am getting the diapers. I do my exercise every morning. Gets walker to get through the house.    Patient Stated Goals reduce the urinary leakage    Currently in Pain? Yes    Pain Score 6     Pain Location Abdomen    Pain Orientation Left;Lower    Pain Descriptors / Indicators Aching    Pain Type Acute pain    Pain Radiating Towards radiates from the left groin to the back    Pain Onset More than a month ago    Pain Frequency Intermittent    Aggravating Factors  when has intercourse, when sitting for a long time; sitting in the computer chair    Pain Relieving Factors getting up    Multiple Pain Sites No              [facility] PT Assessment - 08/20/20 0001      Assessment   Medical Diagnosis N39.3 Stress incontinence of urine    Referring Provider (PT) Dr. Darron Doom    Onset Date/Surgical Date 05/22/19    Prior Therapy none for pelvic floor      Precautions   Precautions Other (comment)    Precaution Comments wheelchair due to pain in hips and knees      Restrictions   Weight Bearing Restrictions No      [place] residence    Living Arrangements Spouse/significant other    Available Help at Discharge Family    Type of [place] to enter    Entrance Stairs-Number of Steps 3-4    Entrance Stairs-Rails None    Home Layout One level    [place] - 2 wheels;Cane - single point;Wheelchair - manual    Additional Comments scoots around old computer chair in the house      Cognition   Overall Cognitive Status Within Functional Limits for tasks assessed      Posture/Postural Control   Posture/Postural  Control Postural limitations    Posture Comments sits in W/C      PROM   Right Hip Extension -17    Right Hip ABduction 12    Left Hip Extension -20    Left Hip ABduction 8      Strength   Right Hip Flexion 4/5    Right Hip Extension 3-/5    Left Hip Flexion 4/5    Left Hip External Rotation 3-/5                      Pelvic Floor Special  Questions - 08/20/20 0001    Urinary Leakage Yes    Pad use 2 diapers during the day and 1 at night    Activities that cause leaking Coughing;With strong urge;Sneezing;Laughing;Other    Urinary urgency Yes   ruinates every hour   Pelvic Floor Internal Exam Patient confirms identification and approves PT to assess pelvic floor and treatment    Exam Type Vaginal    Strength weak squeeze, no lift             [facility] Adult PT Treatment/Exercise - 08/20/20 0001      Neuro Re-ed    Neuro Re-ed Details  going over the urge to void and how to use behaviorla modifications to reduce the urge      Lumbar Exercises: Supine   Bridge 15 reps    Bridge Limitations with verbal cues to breath out when lift    Other Supine Lumbar Exercises gluteal sets with posterior tilt of the pelvis to flattern her spine holding 5 seconds and encouraging to breath out      Knee/Hip Exercises: Supine   Other Supine Knee/Hip Exercises A/AROM for hip abduction and extension 15 timees each                  PT Education - 08/20/20 1406    Education Details went over the urge to void drill    Person(s) Educated Patient    Methods Explanation;Demonstration;Handout;Verbal cues    Comprehension Verbalized understanding;Returned demonstration            PT Short Term Goals - 06/18/20 1358      PT SHORT TERM GOAL #1   Title independent with initial HEP    Time 4    Period Weeks    Status Achieved      PT SHORT TERM GOAL #2   Title understand how to moisturize the vaginal canal and vulvar area to reduce dryness and burning    Time 4    Period  Weeks    Status Achieved             PT Long Term Goals - 08/20/20 1329      PT LONG TERM GOAL #1   Title Pt will be independent with progression of final HEP    Baseline still learning    Time 12    Period Weeks    Status On-going      PT LONG TERM GOAL #2   Title understand what bladder irritants area and how the affect the bladder.    Baseline educated    Time 12    Period Weeks    Status Achieved      PT LONG TERM GOAL #3   Title changes her clothes 2 times per day compared to 5 times per day, due to urinating leakage improving    Baseline changes clothes 3 times per day; leaks 3 times per day    Time 5    Period Weeks    Status On-going      PT LONG TERM GOAL #4   Title buring in the vaginal area decreased </= 1-2/10 due to understanding proper vaginal health and improving the moisture in the vaginal area    Baseline burning in vaginal area is 6/10    Time 12    Period Weeks    Status On-going                 Plan - 08/20/20 1412    Clinical  Impression Statement Patient continues to have burning vaginally. She is not using the coconut oil as much to reduce the dryness. Patient has reports she continues to have the urinary leakage. She was hospitalized for 7 days due to depression. Patient is now walking daily with her walker  around her house 1 time per day. She is motivated to get out of her wheelchair more often. Patient pelvic floor strength is 2/5 and will hold her breath when contracting. Patient has to go to the bathroom right away when she has the urge to void. We reviewed the exercise to reduce ther urge. Patient reports sometimes she has trouble remembering to do this exercise. Hip P/ROM in degrees as follows: right hip extension -17, right hip abduction 12, left hip extension -20, and left hip abduction 8. Bilateral hip flexion is 4/5 and abduction is 3-/5. Patient is having left groin pain that radiates into her back especially sitting and will reduce  when she stands up. Patient has increased hip tightness that will also increase pelvic floor tightness. Patient is now receiving Depends in the mail. Patient will benefit from skilled therapy to improve hip ROM and strength and pelvic floor strength to reduce her leakage.    Personal Factors and Comorbidities Age;Sex;Comorbidity 3+    Comorbidities Trans abdominal hysterectomy; AP repair with TVT; Diabetes; Fibromyalgia; Osteoarthritis; Obstructive sleep apnea    Examination-Activity Limitations Continence;Locomotion Level;Transfers;Toileting;Stand;Bed Mobility    Examination-Participation Restrictions Community Activity;Interpersonal Relationship    Stability/Clinical Decision Making Evolving/Moderate complexity    Rehab Potential Good    PT Frequency 1x / week    PT Duration 12 weeks    PT Treatment/Interventions ADLs/Self Care Home Management;Biofeedback;Electrical Stimulation;Cryotherapy;Moist Heat;Therapeutic activities;Therapeutic exercise;Functional mobility training;Manual techniques;Patient/family education;Neuromuscular re-education    PT Next Visit Plan stretch hips, continute with sit to stand,  ; internal work to get contraction and move an extremity; manual internal work    PT Home Exercise Plan Access Code: [email or web address]    Recommended Other Services sent MD renewal    Consulted and Agree with Plan of Care Patient           Patient will benefit from skilled therapeutic intervention in order to improve the following deficits and impairments:  Decreased endurance, Decreased skin integrity, Decreased activity tolerance, Decreased strength, Pain, Decreased mobility, Decreased coordination  Visit Diagnosis: Muscle weakness (generalized) - Plan: PT plan of care cert/re-cert  Other lack of coordination - Plan: PT plan of care cert/re-cert     Problem List Patient Active Problem List   Diagnosis Date Noted  . Adjustment disorder with mixed disturbance of emotions and conduct  07/28/2020  . Polyp of descending colon 02/15/2020  . Multilevel degenerative disc disease 10/31/2018  . Abdominal pain 10/28/2018  . Change in bowel function 10/28/2018  . Gastritis and gastroduodenitis 04/26/2018  . Atrophic vaginitis 04/01/2018  . Diabetes mellitus ([place]) 03/31/2018  . Cough, persistent 02/08/2018  . Dysphagia 12/14/2017  . 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 Urinary incontinence 07/23/2017  . Early satiety 06/16/2017  . Hx of iron deficiency anemia 05/28/2017  . Throat pain in adult 04/05/2017  . Prediabetes 02/04/2017  . Dyspnea on exertion 12/23/2016  . 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 Hemoptysis 12/23/2016  . Fibromyalgia 08/18/2016  . Chronic lymphocytic thyroiditis 08/18/2016  . Depression 04/01/2016  . OSA (obstructive sleep apnea) 04/01/2016  . Essential hypertension 12/09/2015  . Hypothyroidism 12/09/2015  . Morbid obesity due to excess calories ([place]) 12/09/2015  .  Constipation 05/27/2015  . Fatty liver 05/27/2015  . Abdominal pain, chronic, epigastric 07/04/2012  . Anxiety 06/09/2012  . History of gastroesophageal reflux (GERD) 06/09/2012  . Hyperlipidemia 06/09/2012  . Refusal of blood transfusions as patient is Jehovah's Witness 06/09/2012  . Pituitary macroadenoma ([place]) 04/04/2012  . Abnormal small bowel biopsy 09/18/2011  . Lactose intolerance 09/18/2011  . GERD 01/21/2010    Earlie Counts, PT 08/20/20 2:22 PM   [facility] at [facility] [address] [place], [place], [address] Phone: [phone]   Fax:  [phone]  Name: Robin [name] MRN: [id number] Date of Birth: Jul 29, 1956  PHYSICAL THERAPY DISCHARGE SUMMARY  Visits from Start of Care: 7  Current functional level related to goals / functional outcomes: See above. Patient has not returned since her last appointment.   Remaining deficits: See above.    Education /  Equipment: HEP Plan:                                                    Patient goals were not met. Patient is being discharged due to not returning since the last visit. Thank you for the referral. Earlie Counts, PT 09/26/20 1:59 PM   ?????

## 2020-08-22 ENCOUNTER — Other Ambulatory Visit: Payer: Self-pay

## 2020-08-22 ENCOUNTER — Other Ambulatory Visit: Payer: Self-pay | Admitting: *Deleted

## 2020-08-22 DIAGNOSIS — I10 Essential (primary) hypertension: Secondary | ICD-10-CM | POA: Diagnosis not present

## 2020-08-22 DIAGNOSIS — Z09 Encounter for follow-up examination after completed treatment for conditions other than malignant neoplasm: Secondary | ICD-10-CM | POA: Diagnosis not present

## 2020-08-22 DIAGNOSIS — E7800 Pure hypercholesterolemia, unspecified: Secondary | ICD-10-CM | POA: Diagnosis not present

## 2020-08-22 DIAGNOSIS — E1165 Type 2 diabetes mellitus with hyperglycemia: Secondary | ICD-10-CM | POA: Diagnosis not present

## 2020-08-22 NOTE — Patient Instructions (Addendum)
Visit Information  Robin [name] was given information about Medicaid Managed Care team care coordination services as a part of their Healthy [facility] Medicaid benefit. Robin [name] verbally consented to engagement with the [facility] Managed Care team.   For questions related to your Healthy [facility] health plan, please call: [phone] or visit the homepage here: [email or web address]  If you would like to schedule transportation through your Healthy [facility] plan, please call the following number at least 2 days in advance of your appointment: [phone]   Patient Care Plan: Social Work - Depression (Adult)    Problem Identified: Depression Identification (Depression)     Goal: Psychiatry and Outpatient Therapy for Depressive Symptoms   Note:    Current Barriers:  . 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 . Transportation - Due to husband's treatment, he is not always available to transport patient to her appointments. . Mental Health Concerns  . Patient's husband has cancer and is currently receiving treatment at the [place] in [place]. . Suicidal Ideation/Homicidal Ideation: No  Clinical Social Work Goal(s):  Marland Kitchen Over the next 60 days, patient will work with SW bi-weekly by telephone or in person to reduce or manage symptoms related to depression. . Over the next 30 days, patient will attend all scheduled mental health appointments: as scheduled. Patient will arrange transportation needs in the appropriate time frame to ensure she has transportation for scheduled appointments.  . Over the next 30 days, patient will demonstrate improved health management independence as evidenced by symptom management.   Interventions: . Patient interviewed and appropriate assessments performed: PHQ 9 SDOH Interventions      Most Recent Value  SDOH Interventions  SDOH Interventions for the Following  Domains Depression, Transportation  Food Insecurity Interventions Intervention Not Indicated  Financial Strain Interventions Intervention Not Indicated  Housing Interventions Intervention Not Indicated  Intimate Partner Violence Interventions Intervention Not Indicated  Physical Activity Interventions Other (Comments) - [Patient is not very mobile due to arthritis all over her body.]  Stress Interventions Intervention Not Indicated  Social Connections Interventions Intervention Not Indicated  Transportation Interventions Other (Comment) - [Information forwarded to patient regarding transportation through her Medicaid plan.]  Alcohol Brief Interventions/Follow-up Patient Refused  Depression Interventions/Treatment  Referral to Psychiatry, Counseling    .   Marland Kitchen Patient interviewed and appropriate assessments performed . Provided patient with information about depression and physical illness. . Advised patient to discuss outpatient therapy with psychiatrist. Patient is scheduled for psychiatry at [facility] on 08/15/2020. LCSW encouraged patient to discuss therapy needs with the psychiatrist during this appointment so that she can receive both psychiatry and outpatient therapy at the same agency.  Marland Kitchen Emotional/Supportive Counseling  Patient Self Care Activities:  . Self administers medications as prescribed . Attends church or other social activities . Ability for insight . Motivation for treatment . Strong family or social support  Patient Coping Strengths:  . Supportive Relationships . Family . Friends . Church . [employer] . Spirituality . Hopefulness  Patient Self Care Deficits:  . Unable to independently get around without assistance due to arthritis all over her body. . Unable to perform ADLs independently . Unable to perform IADLs independently . Husband has cancer. . Transportation issues due to husband receiving treatment for cancer.  Initial goal documentation           Problem Identified: Symptoms (Depression)     Problem Identified: Response to Treatment (Depression)     Patient Care Plan:  Weight loss to Improve Health    Problem Identified: Weight Management     Long-Range Goal: Achieving Weight Loss   Start Date: 08/22/2020  Expected End Date: 10/22/2020  This Visit's Progress: On track  Priority: High  Note:    Current Barriers:  Marland Kitchen Knowledge Deficits related to Weight loss. Robin [name] reports that she needs to work on weight loss. She is needing knee surgery, but needs to lose weight before having the procedure. . Unable to independently develop a plan for weight loss . RNCM was asked to call patient back in 10 minutes. RNCM was unable to reestablish contact  Nurse Case Manager Clinical Goal(s):  Marland Kitchen Over the next 30 days, patient will verbalize understanding of plan for weight management . Over the next 60 days, patient will meet with RN Care Manager to address ways to achieve weight loss . Over the next 30 days, patient will work with CM team pharmacist to review medications  Interventions:  . Inter-disciplinary care team collaboration (see longitudinal plan of care) . Provided education to patient re: diet modification, diabetes and exercise  . Discussed plans with patient for ongoing care management follow up and provided patient with direct contact information for care management team . Pharmacy referral for medication review . RNCM explained to patient the importance of managing diabetes and how it can help with weight loss  Patient Goals/Self-Care Activities Over the next 30 days, patient will:  -Self administers medications as prescribed Attends all scheduled provider appointments Calls provider office for new concerns or questions - change to whole grain breads, cereal, pasta - drink 6 to 8 glasses of water each day - eat 3 to 5 servings of fruits and vegetables each day - eat 5 or 6 small meals each day - limit fast food  meals to no more than 1 per week - manage portion size  Follow Up Plan: The Managed Medicaid care management team will reach out to the patient again over the next 30 days.          Please see education materials related to diabetes and diet provided by MyChart link.    Diabetes Mellitus and Nutrition, Adult When you have diabetes (diabetes mellitus), it is very important to have healthy eating habits because your blood sugar (glucose) levels are greatly affected by what you eat and drink. Eating healthy foods in the appropriate amounts, at about the same times every day, can help you:  Control your blood glucose.  Lower your risk of heart disease.  Improve your blood pressure.  Reach or maintain a healthy weight. Every person with diabetes is different, and each person has different needs for a meal plan. Your health care provider may recommend that you work with a diet and nutrition specialist (dietitian) to make a meal plan that is best for you. Your meal plan may vary depending on factors such as:  The calories you need.  The medicines you take.  Your weight.  Your blood glucose, blood pressure, and cholesterol levels.  Your activity level.  Other health conditions you have, such as heart or kidney disease. How do carbohydrates affect me? Carbohydrates, also called carbs, affect your blood glucose level more than any other type of food. Eating carbs naturally raises the amount of glucose in your blood. Carb counting is a method for keeping track of how many carbs you eat. Counting carbs is important to keep your blood glucose at a healthy level, especially if you use insulin or  take certain oral diabetes medicines. It is important to know how many carbs you can safely have in each meal. This is different for every person. Your dietitian can help you calculate how many carbs you should have at each meal and for each snack. Foods that contain carbs include:  Bread, cereal,  rice, pasta, and crackers.  Potatoes and corn.  Peas, beans, and lentils.  Milk and yogurt.  Fruit and juice.  Desserts, such as cakes, cookies, ice cream, and candy. How does alcohol affect me? Alcohol can cause a sudden decrease in blood glucose (hypoglycemia), especially if you use insulin or take certain oral diabetes medicines. Hypoglycemia can be a life-threatening condition. Symptoms of hypoglycemia (sleepiness, dizziness, and confusion) are similar to symptoms of having too much alcohol. If your health care provider says that alcohol is safe for you, follow these guidelines:  Limit alcohol intake to no more than 1 drink per day for nonpregnant women and 2 drinks per day for men. One drink equals 12 oz of beer, 5 oz of wine, or 1 oz of hard liquor.  Do not drink on an empty stomach.  Keep yourself hydrated with water, diet soda, or unsweetened iced tea.  Keep in mind that regular soda, juice, and other mixers may contain a lot of sugar and must be counted as carbs. What are tips for following this plan?  Reading food labels  Start by checking the serving size on the "Nutrition Facts" label of packaged foods and drinks. The amount of calories, carbs, fats, and other nutrients listed on the label is based on one serving of the item. Many items contain more than one serving per package.  Check the total grams (g) of carbs in one serving. You can calculate the number of servings of carbs in one serving by dividing the total carbs by 15. For example, if a food has 30 g of total carbs, it would be equal to 2 servings of carbs.  Check the number of grams (g) of saturated and trans fats in one serving. Choose foods that have low or no amount of these fats.  Check the number of milligrams (mg) of salt (sodium) in one serving. Most people should limit total sodium intake to less than 2,300 mg per day.  Always check the nutrition information of foods labeled as "low-fat" or "nonfat".  These foods may be higher in added sugar or refined carbs and should be avoided.  Talk to your dietitian to identify your daily goals for nutrients listed on the label. Shopping  Avoid buying canned, premade, or processed foods. These foods tend to be high in fat, sodium, and added sugar.  Shop around the outside edge of the grocery store. This includes fresh fruits and vegetables, bulk grains, fresh meats, and fresh dairy. Cooking  Use low-heat cooking methods, such as baking, instead of high-heat cooking methods like deep frying.  Cook using healthy oils, such as olive, canola, or sunflower oil.  Avoid cooking with butter, cream, or high-fat meats. Meal planning  Eat meals and snacks regularly, preferably at the same times every day. Avoid going long periods of time without eating.  Eat foods high in fiber, such as fresh fruits, vegetables, beans, and whole grains. Talk to your dietitian about how many servings of carbs you can eat at each meal.  Eat 4-6 ounces (oz) of lean protein each day, such as lean meat, chicken, fish, eggs, or tofu. One oz of lean protein is equal to: ?  1 oz of meat, chicken, or fish. ? 1 egg. ?  cup of tofu.  Eat some foods each day that contain healthy fats, such as avocado, nuts, seeds, and fish. Lifestyle  Check your blood glucose regularly.  Exercise regularly as told by your health care provider. This may include: ? 150 minutes of moderate-intensity or vigorous-intensity exercise each week. This could be brisk walking, biking, or water aerobics. ? Stretching and doing strength exercises, such as yoga or weightlifting, at least 2 times a week.  Take medicines as told by your health care provider.  Do not use any products that contain nicotine or tobacco, such as cigarettes and e-cigarettes. If you need help quitting, ask your health care provider.  Work with a Social worker or diabetes educator to identify strategies to manage stress and any  emotional and social challenges. Questions to ask a health care provider  Do I need to meet with a diabetes educator?  Do I need to meet with a dietitian?  What number can I call if I have questions?  When are the best times to check my blood glucose? Where to find more information:  American Diabetes Association: diabetes.org  Academy of Nutrition and Dietetics: www.eatright.[employer] of Diabetes and Digestive and Kidney Diseases (NIH): [employer] Summary  A healthy meal plan will help you control your blood glucose and maintain a healthy lifestyle.  Working with a diet and nutrition specialist (dietitian) can help you make a meal plan that is best for you.  Keep in mind that carbohydrates (carbs) and alcohol have immediate effects on your blood glucose levels. It is important to count carbs and to use alcohol carefully. This information is not intended to replace advice given to you by your health care provider. Make sure you discuss any questions you have with your health care provider. Document Revised: 09/03/2017 Document Reviewed: 10/26/2016 Elsevier Patient Education  2020 [employer].     Telephone follow up appointment with Managed Medicaid care management team member scheduled for: 09/24/20 @ 10:30  [name] RN, BSN [place]  [employer] manager

## 2020-08-22 NOTE — Patient Outreach (Signed)
Care Coordination - Case Manager  08/22/2020  Robin [name] 1956-09-07 [id number]  Subjective:  Robin [name] is an 64 y.o. year old female who is a primary patient of Robin Pier, [name].  Robin [name] was given information about Medicaid Managed Care team care coordination services today. Robin [name] agreed to services and verbal consent obtained  Robin [name] and this RNCM had a nice conversation regarding her wanting to lose weight to benefit her health. She asked RNCM to call her back in 10 min. RNCM was unable to reconnect with this patient. This RNCM will follow up again in 30 days.  Review of patient status, laboratory and other test data was performed as part of evaluation for provision of services.  SDOH: SDOH Screenings   Alcohol Screen: Low Risk    Last Alcohol Screening Score (AUDIT): 0  Depression (PHQ2-9): Medium Risk   PHQ-2 Score: 18  Financial Resource Strain: Low Risk    Difficulty of Paying Living Expenses: Not very hard  Food Insecurity: No Food Insecurity   Worried About Charity fundraiser in the Last Year: Never true   Ran Out of Food in the Last Year: Never true  Housing: [place] Risk Score: 0  Physical Activity: Inactive   Days of Exercise per Week: 0 days   Minutes of Exercise per Session: 0 min  Social Connections: Engineer, building services of Communication with Friends and Family: More than three times a week   Frequency of Social Gatherings with Friends and Family: More than three times a week   Attends Religious Services: More than 4 times per year   Active Member of [employer] or Organizations: Yes   Attends Music therapist: More than 4 times per year   Marital Status: Married  Stress: No Stress Concern Present   Feeling of Stress : Only a little  Tobacco Use: Medium Risk   Smoking Tobacco Use: Former Smoker   Smokeless Tobacco Use: Never Used  Transportation Needs: Recruitment consultant (Medical): Yes   Lack of Transportation (Non-Medical): Yes     Objective:    Allergies  Allergen Reactions   Celexa [Citalopram Hydrobromide] Other (See Comments)    "Made me feel out of it"   Gabapentin Other (See Comments)    Contributed to lower extremity edema   Norvasc [Amlodipine Besylate] Other (See Comments)    Edema in lower extremities   Diflucan [Fluconazole] Rash    Medications:    Medications Reviewed Today    Reviewed by Robin [name], PT (Physical Therapist) on 08/20/20 at 1321  Med List Status: <None>  Medication Order Taking? Sig Documenting Provider Last Dose Status Informant  ACCU-CHEK AVIVA PLUS test strip [id number] No USE AS DIRECTED Robin Pier, [name] Taking Active Multiple Informants  ACCU-CHEK AVIVA PLUS test strip [id number] No USE AS INSTRUCTED Robin Pier, [name] Taking Active Multiple Informants  acetaminophen (TYLENOL) 500 MG tablet [id number] No Take 1,000 mg by mouth 2 (two) times daily.  Robin [name] Taking Active Multiple Informants  albuterol (VENTOLIN HFA) 108 (90 Base) MCG/ACT inhaler [id number] No Inhale 1-2 puffs into the lungs every 6 (six) hours as needed for wheezing or shortness of breath. Robin July, NP Taking Active Multiple Informants  amitriptyline (ELAVIL) 50 MG tablet [id number] No TAKE 1 TABLET BY MOUTH AT BEDTIME.  Patient taking differently: Take 50 mg by mouth at bedtime.  Robin Pier, [name] Taking Active Multiple Informants  aspirin EC 81 MG tablet [id number] No Take 81 mg by mouth at bedtime. Robin [name] Taking Active Multiple Informants  atorvastatin (LIPITOR) 40 MG tablet [id number] No Take 40 mg by mouth daily. Robin [name] Taking Active Multiple Informants  azelastine (ASTELIN) 0.1 % nasal spray [id number] No 1-2 sprays per nostril 2 times daily as needed  Patient taking differently: Place 1 spray into both nostrils 2 (two) times daily.     Robin [name] Taking Active Multiple Informants  buPROPion (WELLBUTRIN XL) 300 MG 24 hr tablet [id number] No TAKE 1 TABLET (300 MG TOTAL) BY MOUTH EVERY MORNING.  Patient taking differently: Take 300 mg by mouth daily.    Robin Pier, [name] Taking Active Multiple Informants  CALCIUM CARBONATE ANTACID PO [id number] No Take 2 tablets by mouth See admin instructions. Take 2 tablets by mouth every morning, may also take 2 tablets at night as needed for acid reflux Robin [name] Taking Active Multiple Informants  celecoxib (CELEBREX) 100 MG capsule [id number] No Take 1 capsule (100 mg total) by mouth 2 (two) times daily. Robin Pier, [name] Taking Active Multiple Informants  CINNAMON PO [id number] No Take 1 capsule by mouth daily. Robin [name] Taking Active Multiple Informants  conjugated estrogens (PREMARIN) vaginal cream [id number] No Place 1 Applicatorful vaginally daily as needed (vaginal dryness/itching). Robin [name] Taking Active Multiple Informants  diclofenac sodium (VOLTAREN) 1 % GEL [id number] No Apply 2-4 g topically 4 (four) times daily as needed (pain).   Patient not taking: Reported on 08/14/2020   Robin [name] Not Taking Active Multiple Informants           Med Note Robin [name]   Sat Jul 27, 2020  4:51 PM) Needs refill  DULoxetine (CYMBALTA) 30 MG capsule [id number] No TAKE 1 CAPSULE (30 MG TOTAL) BY MOUTH DAILY. Robin Pier, [name] Taking Active   esomeprazole (NEXIUM) 40 MG capsule [id number] No Take 1 capsule (40 mg total) by mouth 2 (two) times daily before a meal.  Patient taking differently: Take 40 mg by mouth daily.    Robin [name] Taking Active Multiple Informants  famotidine (PEPCID) 20 MG tablet [id number] No Take 1 tablet (20 mg total) by mouth 2 (two) times daily as needed for heartburn or indigestion.  Patient taking differently: Take 20 mg by mouth 2 (two) times daily.    Robin [name]  Taking Active Multiple Informants  fluticasone (FLONASE) 50 MCG/ACT nasal spray [id number] No Place 1 spray into both nostrils daily.  Patient taking differently: Place 1 spray into both nostrils daily as needed for allergies or rhinitis.    Robin July, NP Taking Active Multiple Informants  fluticasone (FLOVENT HFA) 220 MCG/ACT inhaler [id number] No 2 inhalations twice daily  Patient taking differently: Inhale 2 puffs into the lungs 2 (two) times daily as needed (shortness of breath/wheezing).    Robin [name] Taking Active Multiple Informants           Med Note Robin [name]   YBO Jul 27, 2020  4:36 PM) Pt states that she only took once - when she could not find her albuterol  furosemide (LASIX) 40 MG tablet [id number] No TAKE 1/2 TO 1 TABLET BY MOUTH DAILY FOR LOWER EXTREMITY SWELLING  Patient taking differently: Take 20 mg by mouth daily.    Robin Pier, [name] Taking Active Multiple Informants  gabapentin (  NEURONTIN) 300 MG capsule [id number] No TAKE 1 CAPSULE (300 MG TOTAL) BY MOUTH 4 (FOUR) TIMES DAILY.  Patient taking differently: Take 600 mg by mouth 2 (two) times daily.    Tanda Rockers, [name] Taking Active Multiple Informants           Med Note Robin [name]   NOB Jul 27, 2020  4:33 PM) Pt states that [name] changed to lyrica but that was too expensive - would like refill on gabapentin.  glucose blood test strip [id number] No USE AS INSTRUCTED Robin [name] Taking Active Multiple Informants  hydrALAZINE (APRESOLINE) 50 MG tablet [id number] No Take 1 tablet (50 mg total) by mouth 3 (three) times daily.  Patient taking differently: Take 50 mg by mouth 2 (two) times daily.    Erlene Quan, [name] Taking Active Multiple Informants  hydrochlorothiazide (HYDRODIURIL) 25 MG tablet [id number] No Take 25 mg by mouth daily. Robin [name] Taking Active Multiple Informants           Med Note Robin [name]   XNT Jul 27, 2020  4:44 PM) #90 filled  07/09/2020 per external pharmacy records - pt is not sure if she is taking this or not.  levothyroxine (SYNTHROID) 88 MCG tablet [id number] No TAKE 1 TABLET (88 MCG TOTAL) BY MOUTH DAILY. Robin Pier, [name] Taking Active Multiple Informants  LINZESS 290 MCG CAPS capsule [id number] No TAKE 1 CAPSULE BY MOUTH DAILY BEFORE BREAKFAST.  Patient taking differently: Take 290 mcg by mouth daily.    Carlis Stable, NP Taking Active Multiple Informants  losartan (COZAAR) 25 MG tablet [id number] No Take 25 mg by mouth daily. Robin [name] Taking Active Multiple Informants  metFORMIN (GLUCOPHAGE) 1000 MG tablet [id number] No Take 1,000 mg by mouth 2 (two) times daily. Robin [name] Taking Active Multiple Informants  Metoprolol Tartrate 75 MG TABS [id number] No TAKE 1 TABLET BY MOUTH TWICE DAILY.  Patient taking differently: Take 75 mg by mouth 2 (two) times daily.    Robin Pier, [name] Taking Active Multiple Informants  montelukast (SINGULAIR) 10 MG tablet [id number] No TAKE 1 TABLET BY MOUTH AT BEDTIME. Robin Pier, [name] Taking Active Multiple Informants           Med Note Robin [name]   TGY Jul 27, 2020  4:40 PM) Pt states she is only taking OTC allergy medication  Multiple Vitamin (MULTIVITAMIN WITH MINERALS) TABS tablet [id number] No Take 1 tablet by mouth daily.  Robin [name] Taking Active Multiple Informants  Omega-3 Fatty Acids (FISH OIL) 1000 MG CAPS [id number] No Take 1,000 mg by mouth at bedtime.  Robin [name] Taking Active Multiple Informants  ondansetron (ZOFRAN) 4 MG tablet [id number] No Take 4 mg by mouth every 8 (eight) hours as needed for nausea or vomiting.   Patient not taking: Reported on 08/14/2020   Robin [name] Not Taking Active Multiple Informants  OVER THE COUNTER MEDICATION [id number] No Take 1 capsule by mouth daily. BEET ROOT  Robin [name] Taking Active Multiple Informants  pregabalin (LYRICA) 25 MG capsule  [id number] No Take 25 mg by mouth 2 (two) times daily.  Patient not taking: Reported on 07/27/2020   Robin [name] Not Taking Active Multiple Informants           Med Note Robin [name]   Sat Jul 27, 2020  4:41 PM) Pt states that she could not afford the cost of this medication - would like  RX for gabapentin  risperiDONE (RISPERDAL) 2 MG tablet [id number] No Take by mouth. Robin [name] Taking Active   tiZANidine (ZANAFLEX) 4 MG tablet [id number] No Take 4 mg by mouth 2 (two) times daily as needed for muscle spasms. Robin [name] Taking Active Multiple Informants  TRULICITY 8.14 GY/1.8HU SOPN [id number] No Inject 0.75 mg into the skin once a week.  Robin [name] Taking Active Multiple Informants           Med Note Robin [name]   ZCH Jul 27, 2020  4:34 PM) New med 07/24/2020 - not yet taken  valACYclovir (VALTREX) 500 MG tablet [id number] No Take 500 mg by mouth 2 (two) times daily.  Patient not taking: Reported on 07/27/2020   Robin [name] Not Taking Active Multiple Informants           Med Note Robin [name]   OIN Jul 27, 2020  4:46 PM) 7 day course filled 03/17/2020 and 04/09/2020  vitamin B-12 (CYANOCOBALAMIN) 1000 MCG tablet [id number] No Take 1,000 mcg by mouth daily. Robin [name] Taking Active Multiple Informants  Vitamin D, Ergocalciferol, (DRISDOL) 1.25 MG (50000 UNIT) CAPS capsule [id number] No Take 50,000 Units by mouth once a week. 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  Transportation - Due to husband's treatment, he is not always available to transport patient to her appointments.  Mental Health Concerns   Patient's husband has cancer and is currently receiving treatment at the [facility] in [place].  Suicidal Ideation/Homicidal Ideation: No  Clinical Social Work Goal(s):   Over the next 60 days, patient will work with SW bi-weekly by telephone or in person to reduce or manage symptoms related to depression.  Over the next 30 days, patient will attend all scheduled mental health appointments: as scheduled. Patient will arrange transportation needs in the appropriate time frame to ensure she has transportation for scheduled appointments.   Over the next 30 days, patient will demonstrate improved health management independence as evidenced by symptom management.   Interventions:  Patient interviewed and appropriate assessments performed: PHQ 9 SDOH Interventions      Most Recent Value  SDOH Interventions  SDOH Interventions for the Following Domains Depression, Transportation  Food Insecurity Interventions Intervention Not Indicated  Financial Strain Interventions Intervention Not Indicated  Housing Interventions Intervention Not Indicated  Intimate Partner Violence Interventions Intervention Not Indicated  Physical Activity Interventions Other (Comments)  [Patient is not very mobile due to arthritis all over her body.]  Stress Interventions Intervention Not Indicated  Social Connections Interventions Intervention Not Indicated  Transportation Interventions Other (Comment)  [Information forwarded to patient regarding transportation through her Medicaid plan.]  Alcohol Brief Interventions/Follow-up Patient Refused  Depression Interventions/Treatment  Referral to Psychiatry, Counseling        Patient interviewed and appropriate assessments performed  Provided patient with information about depression and physical  illness.  Advised patient to discuss outpatient therapy with psychiatrist. Patient is scheduled  for psychiatry at [facility] on 08/15/2020. LCSW encouraged patient to discuss therapy needs with the psychiatrist during this appointment so that she can receive both psychiatry and outpatient therapy at the same agency.   Emotional/Supportive Counseling  Patient Self Care Activities:   Self administers medications as prescribed  Attends church or other social activities  Ability for insight  Motivation for treatment  Strong family or social support  Patient Coping Strengths:   [place]  Spirituality  Hopefulness  Patient Self Care Deficits:   Unable to independently get around without assistance due to arthritis all over her body.  Unable to perform ADLs independently  Unable to perform IADLs independently  Husband has cancer.  Transportation issues due to husband receiving treatment for cancer.  Initial goal documentation   Task: Identify Depressive Symptoms and Facilitate Treatment   Note:   Care Management Activities:        Notes:   Problem Identified: Symptoms (Depression)     Problem Identified: Response to Treatment (Depression)     Patient Care Plan: Weight loss to Improve Health    Problem Identified: Weight Management     Long-Range Goal: Achieving Weight Loss   Start Date: 08/22/2020  Expected End Date: 10/22/2020  This Visit's Progress: On track  Priority: High  Note:    Current Barriers:   Knowledge Deficits related to Weight loss. Ms. Cumbie reports that she needs to work on weight loss. She is needing knee surgery, but needs to lose weight before having the procedure.  Unable to independently develop a plan for weight loss  RNCM was asked to call patient back in 10 minutes. RNCM was unable to reestablish contact  Nurse Case Manager Clinical Goal(s):   Over the next 30  days, patient will verbalize understanding of plan for weight management  Over the next 60 days, patient will meet with RN Care Manager to address ways to achieve weight loss  Over the next 30 days, patient will work with CM team pharmacist to review medications  Interventions:   Inter-disciplinary care team collaboration (see longitudinal plan of care)  Provided education to patient re: diet modification, diabetes and exercise   Discussed plans with patient for ongoing care management follow up and provided patient with direct contact information for care management team  Pharmacy referral for medication review  RNCM explained to patient the importance of managing diabetes and how it can help with weight loss  Patient Goals/Self-Care Activities Over the next 30 days, patient will:  -Self administers medications as prescribed Attends all scheduled provider appointments Calls provider office for new concerns or questions - change to whole grain breads, cereal, pasta - drink 6 to 8 glasses of water each day - eat 3 to 5 servings of fruits and vegetables each day - eat 5 or 6 small meals each day - limit fast food meals to no more than 1 per week - manage portion size  Follow Up Plan: The Managed Medicaid care management team will reach out to the patient again over the next 30 days.         Plan:RNCM will follow up with a telephone visit on 09/24/20 @ 10:30am  [name] RN, [place] RN Care Coordinator

## 2020-08-23 DIAGNOSIS — F331 Major depressive disorder, recurrent, moderate: Secondary | ICD-10-CM | POA: Diagnosis not present

## 2020-08-27 ENCOUNTER — Encounter: Payer: Medicaid Other | Admitting: Physical Therapy

## 2020-08-28 ENCOUNTER — Other Ambulatory Visit: Payer: Self-pay

## 2020-08-28 ENCOUNTER — Ambulatory Visit: Payer: Self-pay

## 2020-08-28 NOTE — Patient Instructions (Signed)
Visit Information Robin [name], it was a pleasure speaking with you today. If you have any questions or concerns, please contact me at [phone].   Robin [name] was given information about Medicaid Managed Care team care coordination services as a part of their Healthy [facility] Medicaid benefit. Robin [name] verbally consented to engagement with the [facility] Managed Care team.   For questions related to your Healthy [facility] health plan, please call: [phone] or visit the homepage here: [email or web address]  If you would like to schedule transportation through your Healthy [facility] plan, please call the following number at least 2 days in advance of your appointment: [phone]  Goals Addressed            This Visit's Progress   . Psychiatry and therapy treatment for depression   On track    [place] (see longitudinal plan of care for additional care plan information)  Current Barriers:  . 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 . Transportation - Due to husband's treatment, he is not always available to transport Robin [name] to her appointments. Update 08/28/2020: Transportation issues still create barriers for Robin [name]. She is aware that she can contact her Medicaid plan, Healthy Blue, for transportation assistance. . Mental Health Concerns  . Robin [name]'s husband has cancer and is currently receiving treatment at the [facility] in [place]. . Suicidal Ideation/Homicidal Ideation: No  Clinical Social Work Goal(s):  Marland Kitchen Over the next 60 days, Robin [name] will work with SW bi-weekly by telephone or in person to reduce or manage symptoms related to depression. . Over the next 30 days, Robin [name] will attend all scheduled mental health appointments: as scheduled. Robin [name] will arrange transportation needs in the appropriate time frame to ensure she has transportation for  scheduled appointments.  . Over the next 30 days, Robin [name] will demonstrate improved health management independence as evidenced by symptom management.   Interventions: . Robin [name] interviewed and appropriate assessments performed: PHQ 9 . Robin [name] interviewed and appropriate assessments performed . Provided Robin [name] with information about depression and physical illness. . Advised Robin [name] to discuss outpatient therapy with psychiatrist. Robin [name] is scheduled for psychiatry at [facility] on 08/15/2020. LCSW encouraged Robin [name] to discuss therapy needs with the psychiatrist during this appointment so that she can receive both psychiatry and outpatient therapy at the same agency.  . Begin therapy and attend scheduled appointments. Marland Kitchen Update 08/28/2020: Robin [name] stated she had to cancel the appointment due to transportation issues and reschedule for today, 08/28/2020 @ 2:30pm. She stated she would like to discuss therapy needs at that time.  Marland Kitchen Emotional/Supportive Counseling  Robin [name] Self Care Activities:  . Self administers medications as prescribed . Attends church or other social activities . Ability for insight . Motivation for treatment . Strong family or social support  Robin [name] Coping Strengths:  . Supportive Relationships . Family . Friends . Church . [employer] . Spirituality . Hopefulness  Robin [name] Self Care Deficits:  . Unable to independently get around without assistance due to arthritis all over her body. . Unable to perform ADLs independently . Unable to perform IADLs independently . Husband has cancer. . Transportation issues due to husband receiving treatment for cancer.   Please see past updates related to this goal by clicking on the "Past Updates" button in the selected goal       Robin [name] Care Plan: Social Work - Depression (Adult)    Problem Identified: Depression Identification (Depression)     Goal: Psychiatry and  Outpatient Therapy for Depressive Symptoms     Note:    Current Barriers:  . 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 . Transportation - Due to husband's treatment, he is not always available to transport Robin [name] to her appointments. . Mental Health Concerns  . Robin [name]'s husband has cancer and is currently receiving treatment at the [facility] in [place]. . Suicidal Ideation/Homicidal Ideation: No  Clinical Social Work Goal(s):  Marland Kitchen Over the next 60 days, Robin [name] will work with SW bi-weekly by telephone or in person to reduce or manage symptoms related to depression. . Over the next 30 days, Robin [name] will attend all scheduled mental health appointments: as scheduled. Robin [name] will arrange transportation needs in the appropriate time frame to ensure she has transportation for scheduled appointments.  . Over the next 30 days, Robin [name] will demonstrate improved health management independence as evidenced by symptom management.   Interventions: . Robin [name] interviewed and appropriate assessments performed: PHQ 9 SDOH Interventions      Most Recent Value  SDOH Interventions  SDOH Interventions for the Following Domains Depression, Transportation  Food Insecurity Interventions Intervention Not Indicated  Financial Strain Interventions Intervention Not Indicated  Housing Interventions Intervention Not Indicated  Intimate Partner Violence Interventions Intervention Not Indicated  Physical Activity Interventions Other (Comments)  [Robin [name] is not very mobile due to arthritis all over her body.]  Stress Interventions Intervention Not Indicated  Social Connections Interventions Intervention Not Indicated  Transportation Interventions Other (Comment)  [Information forwarded to Robin [name] regarding transportation through her Medicaid plan.]  Alcohol Brief Interventions/Follow-up Robin [name] Refused  Depression Interventions/Treatment  Referral to Psychiatry, Counseling    .   Marland Kitchen Robin [name] interviewed  and appropriate assessments performed . Provided Robin [name] with information about depression and physical illness. . Advised Robin [name] to discuss outpatient therapy with psychiatrist. Robin [name] is scheduled for psychiatry at [facility] on 08/15/2020. LCSW encouraged Robin [name] to discuss therapy needs with the psychiatrist during this appointment so that she can receive both psychiatry and outpatient therapy at the same agency.  Marland Kitchen Emotional/Supportive Counseling  Robin [name] Self Care Activities:  . Self administers medications as prescribed . Attends church or other social activities . Ability for insight . Motivation for treatment . Strong family or social support  Robin [name] Coping Strengths:  . Supportive Relationships . Family . Friends . Church . [employer] . Spirituality . Hopefulness  Robin [name] Self Care Deficits:  . Unable to independently get around without assistance due to arthritis all over her body. . Unable to perform ADLs independently . Unable to perform IADLs independently . Husband has cancer. . Transportation issues due to husband receiving treatment for cancer.  Initial goal documentation   Task: Identify Depressive Symptoms and Facilitate Treatment     Problem Identified: Symptoms (Depression)     Problem Identified: Response to Treatment (Depression)     Robin [name] Care Plan: Weight loss to Improve Health    Problem Identified: Weight Management     Long-Range Goal: Achieving Weight Loss   Start Date: 08/22/2020  Expected End Date: 10/22/2020  This Visit's Progress: On track  Priority: High  Note:    Current Barriers:  Marland Kitchen Knowledge Deficits related to Weight loss. Robin [name] reports that she needs to work on weight loss. She is needing knee surgery, but needs to lose weight before having the procedure. . Unable to independently develop a plan for weight loss . RNCM was asked to call Robin [name] back in 10 minutes. RNCM was unable to reestablish contact  Nurse Case  Manager Clinical Goal(s):  Marland Kitchen Over the next 30 days, Robin [name] will verbalize understanding of plan for weight management . Over the next 60 days, Robin [name] will meet with RN Care Manager to address ways to achieve weight loss . Over the next 30 days, Robin [name] will work with CM team pharmacist to review medications  Interventions:  . Inter-disciplinary care team collaboration (see longitudinal plan of care) . Provided education to Robin [name] re: diet modification, diabetes and exercise  . Discussed plans with Robin [name] for ongoing care management follow up and provided Robin [name] with direct contact information for care management team . Pharmacy referral for medication review . RNCM explained to Robin [name] the importance of managing diabetes and how it can help with weight loss  Robin [name] Goals/Self-Care Activities Over the next 30 days, Robin [name] will:  -Self administers medications as prescribed Attends all scheduled provider appointments Calls provider office for new concerns or questions - change to whole grain breads, cereal, pasta - drink 6 to 8 glasses of water each day - eat 3 to 5 servings of fruits and vegetables each day - eat 5 or 6 small meals each day - limit fast food meals to no more than 1 per week - manage portion size  Follow Up Plan: The Managed Medicaid care management team will reach out to the Robin [name] again over the next 30 days.        Robin [name] verbalizes understanding of instructions provided today.   Licensed Clinical Social Worker will follow up on September 25, 2020 @ 2:00pm. The Robin [name] has been provided with contact information for the Managed Medicaid care management team and has been advised to call with any health related questions or concerns.    Netta Neat, BSW, MSW, LCSW Social Work Case Freight forwarder - [place]  Direct [place]: [phone]

## 2020-08-28 NOTE — Patient Outreach (Signed)
Care Coordination- Social Work  08/28/2020  Robin [name] [birth year] [id number]  Subjective:    Robin [name] is an 64 y.o. year old female who is a primary patient of Ladell Pier, MD.    Robin [name] was given information about Medicaid Managed Care team care coordination services today. Robin [name] agreed to services and verbal consent obtained  Review of patient status, laboratory and other test data was performed as part of evaluation for provision of services.  SDOH:   SDOH Screenings   Alcohol Screen: Low Risk   . Last Alcohol Screening Score (AUDIT): 0  Depression (PHQ2-9): Medium Risk  . PHQ-2 Score: 16  Financial Resource Strain: Low Risk   . Difficulty of Paying Living Expenses: Not very hard  Food Insecurity: No Food Insecurity  . Worried About Charity fundraiser in the Last Year: Never true  . Ran Out of Food in the Last Year: Never true  Housing: Low Risk   . Last Housing Risk Score: 0  Physical Activity: Inactive  . Days of Exercise per Week: 0 days  . Minutes of Exercise per Session: 0 min  Social Connections: Socially Integrated  . Frequency of Communication with Friends and Family: More than three times a week  . Frequency of Social Gatherings with Friends and Family: More than three times a week  . Attends Religious Services: More than 4 times per year  . Active Member of Clubs or Organizations: Yes  . Attends Archivist Meetings: More than 4 times per year  . Marital Status: Married  Stress: No Stress Concern Present  . Feeling of Stress : Only a little  Tobacco Use: Medium Risk  . Smoking Tobacco Use: Former Smoker  . Smokeless Tobacco Use: Never Used  Transportation Needs: Unmet Transportation Needs  . Lack of Transportation (Medical): Yes  . Lack of Transportation (Non-Medical): Yes   SDOH Interventions     Most Recent Value  SDOH Interventions  Food Insecurity Interventions Intervention Not Indicated  Financial Strain  Interventions Intervention Not Indicated  [Patient applied for disabiity.]  Housing Interventions Intervention Not Indicated  Stress Interventions Intervention Not Indicated  Depression Interventions/Treatment  Currently on Treatment  [Patient schedued for appointment with psychiatrist on today, 08/28/2020 at 2:30pm. She will discuss therapy needs as well.]      Objective:    Medications:  Medications Reviewed Today    Reviewed by Netta Neat, LCSW (Social Worker) on 08/28/20 at 1340  Med List Status: <None>  Medication Order Taking? Sig Documenting Provider Last Dose Status Informant  ACCU-CHEK AVIVA PLUS test strip [id number]  USE AS DIRECTED Ladell Pier, MD  Active Multiple Informants  ACCU-CHEK AVIVA PLUS test strip [id number]  USE AS INSTRUCTED Ladell Pier, MD  Active Multiple Informants  acetaminophen (TYLENOL) 500 MG tablet [id number]  Take 1,000 mg by mouth 2 (two) times daily.  [provider]  Active Multiple Informants  albuterol (VENTOLIN HFA) 108 (90 Base) MCG/ACT inhaler [id number]  Inhale 1-2 puffs into the lungs every 6 (six) hours as needed for wheezing or shortness of breath. Loura Halt A, NP  Active Multiple Informants  amitriptyline (ELAVIL) 50 MG tablet [id number]  TAKE 1 TABLET BY MOUTH AT BEDTIME.  Patient taking differently: Take 50 mg by mouth at bedtime.    Ladell Pier, MD  Active Multiple Informants  aspirin EC 81 MG tablet [id number]  Take 81 mg by mouth at bedtime. [provider]  Active Multiple  Informants  atorvastatin (LIPITOR) 40 MG tablet [id number]  Take 40 mg by mouth daily. [provider]  Active Multiple Informants  azelastine (ASTELIN) 0.1 % nasal spray [id number]  1-2 sprays per nostril 2 times daily as needed  Patient taking differently: Place 1 spray into both nostrils 2 (two) times daily.    Bobbitt, Sedalia Muta, MD  Active Multiple Informants  buPROPion (WELLBUTRIN XL) 300 MG 24 hr tablet [id number]  Yes TAKE 1 TABLET (300 MG TOTAL) BY MOUTH EVERY MORNING.  Patient taking differently: Take 300 mg by mouth daily.    Ladell Pier, MD Taking Active Multiple Informants  CALCIUM CARBONATE ANTACID PO [id number]  Take 2 tablets by mouth See admin instructions. Take 2 tablets by mouth every morning, may also take 2 tablets at night as needed for acid reflux [provider]  Active Multiple Informants  celecoxib (CELEBREX) 100 MG capsule [id number]  Take 1 capsule (100 mg total) by mouth 2 (two) times daily. Ladell Pier, MD  Active Multiple Informants  CINNAMON PO [id number]  Take 1 capsule by mouth daily. [provider]  Active Multiple Informants  conjugated estrogens (PREMARIN) vaginal cream [id number]  Place 1 Applicatorful vaginally daily as needed (vaginal dryness/itching). [provider]  Active Multiple Informants  diclofenac sodium (VOLTAREN) 1 % GEL [id number]  Apply 2-4 g topically 4 (four) times daily as needed (pain).   Patient not taking: Reported on 08/14/2020   [provider]  Active Multiple Informants           Med Note Payton Doughty   Sat Jul 27, 2020  4:51 PM) Needs refill  DULoxetine (CYMBALTA) 30 MG capsule [id number] Yes TAKE 1 CAPSULE (30 MG TOTAL) BY MOUTH DAILY. Ladell Pier, MD Taking Active   esomeprazole (NEXIUM) 40 MG capsule [id number]  Take 1 capsule (40 mg total) by mouth 2 (two) times daily before a meal.  Patient taking differently: Take 40 mg by mouth daily.    Mahala Menghini, PA-C  Active Multiple Informants  famotidine (PEPCID) 20 MG tablet [id number]  Take 1 tablet (20 mg total) by mouth 2 (two) times daily as needed for heartburn or indigestion.  Patient taking differently: Take 20 mg by mouth 2 (two) times daily.    Mahala Menghini, PA-C  Active Multiple Informants  fluticasone (FLONASE) 50 MCG/ACT nasal spray [id number]  Place 1 spray into both nostrils daily.  Patient taking differently: Place 1 spray  into both nostrils daily as needed for allergies or rhinitis.    Orvan July, NP  Active Multiple Informants  fluticasone (FLOVENT HFA) 220 MCG/ACT inhaler [id number]  2 inhalations twice daily  Patient taking differently: Inhale 2 puffs into the lungs 2 (two) times daily as needed (shortness of breath/wheezing).    Bobbitt, Sedalia Muta, MD  Active Multiple Informants           Med Note Payton Doughty   SVX Jul 27, 2020  4:36 PM) Pt states that she only took once - when she could not find her albuterol  furosemide (LASIX) 40 MG tablet [id number]  TAKE 1/2 TO 1 TABLET BY MOUTH DAILY FOR LOWER EXTREMITY SWELLING  Patient taking differently: Take 20 mg by mouth daily.    Ladell Pier, MD  Active Multiple Informants  gabapentin (NEURONTIN) 300 MG capsule [id number]  TAKE 1 CAPSULE (300 MG TOTAL) BY MOUTH 4 (FOUR) TIMES DAILY.  Patient taking differently: Take 600 mg by mouth 2 (two) times  daily.    Tanda Rockers, MD  Active Multiple Informants           Med Note Payton Doughty   SWN Jul 27, 2020  4:33 PM) Pt states that MD changed to lyrica but that was too expensive - would like refill on gabapentin.  glucose blood test strip [id number]  USE AS INSTRUCTED [provider]  Active Multiple Informants  hydrALAZINE (APRESOLINE) 50 MG tablet [id number]  Take 1 tablet (50 mg total) by mouth 3 (three) times daily.  Patient taking differently: Take 50 mg by mouth 2 (two) times daily.    Erlene Quan, PA-C  Active Multiple Informants  hydrochlorothiazide (HYDRODIURIL) 25 MG tablet [id number]  Take 25 mg by mouth daily. [provider]  Active Multiple Informants           Med Note Payton Doughty   YBO Jul 27, 2020  4:44 PM) #90 filled 07/09/2020 per external pharmacy records - pt is not sure if she is taking this or not.  levothyroxine (SYNTHROID) 88 MCG tablet [id number]  TAKE 1 TABLET (88 MCG TOTAL) BY MOUTH DAILY. Ladell Pier, MD  Active Multiple Informants    LINZESS 290 MCG CAPS capsule [id number]  TAKE 1 CAPSULE BY MOUTH DAILY BEFORE BREAKFAST.  Patient taking differently: Take 290 mcg by mouth daily.    Carlis Stable, NP  Active Multiple Informants  losartan (COZAAR) 25 MG tablet [id number]  Take 25 mg by mouth daily. [provider]  Active Multiple Informants  metFORMIN (GLUCOPHAGE) 1000 MG tablet [id number]  Take 1,000 mg by mouth 2 (two) times daily. [provider]  Active Multiple Informants  Metoprolol Tartrate 75 MG TABS [id number]  TAKE 1 TABLET BY MOUTH TWICE DAILY.  Patient taking differently: Take 75 mg by mouth 2 (two) times daily.    Ladell Pier, MD  Active Multiple Informants  montelukast (SINGULAIR) 10 MG tablet [id number]  TAKE 1 TABLET BY MOUTH AT BEDTIME. Ladell Pier, MD  Active Multiple Informants           Med Note Payton Doughty   SNK Jul 27, 2020  4:40 PM) Pt states she is only taking OTC allergy medication  Multiple Vitamin (MULTIVITAMIN WITH MINERALS) TABS tablet [id number]  Take 1 tablet by mouth daily.  [provider]  Active Multiple Informants  Omega-3 Fatty Acids (FISH OIL) 1000 MG CAPS [id number]  Take 1,000 mg by mouth at bedtime.  [provider]  Active Multiple Informants  ondansetron (ZOFRAN) 4 MG tablet [id number]  Take 4 mg by mouth every 8 (eight) hours as needed for nausea or vomiting.   Patient not taking: Reported on 08/14/2020   [provider]  Active Multiple Informants  OVER THE COUNTER MEDICATION [id number]  Take 1 capsule by mouth daily. BEET ROOT  [provider]  Active Multiple Informants  pregabalin (LYRICA) 25 MG capsule [id number]  Take 25 mg by mouth 2 (two) times daily.  Patient not taking: Reported on 07/27/2020   [provider]  Active Multiple Informants           Med Note Payton Doughty   JHE Jul 27, 2020  4:41 PM) Pt states that she could not afford the cost of this medication - would like RX for gabapentin   risperiDONE (RISPERDAL) 2 MG tablet [id number] Yes Take by mouth. [provider] Taking Active   tiZANidine (ZANAFLEX) 4 MG tablet [id number]  Take 4 mg  by mouth 2 (two) times daily as needed for muscle spasms. [provider]  Active Multiple Informants  TRULICITY 6.01 UX/3.2TF SOPN [id number]  Inject 0.75 mg into the skin once a week.  [provider]  Active Multiple Informants           Med Note Payton Doughty   YHC Jul 27, 2020  4:34 PM) New med 07/24/2020 - not yet taken  valACYclovir (VALTREX) 500 MG tablet [id number]  Take 500 mg by mouth 2 (two) times daily.  Patient not taking: Reported on 07/27/2020   [provider]  Active Multiple Informants           Med Note Payton Doughty   DVV Jul 27, 2020  4:46 PM) 7 day course filled 03/17/2020 and 04/09/2020  vitamin B-12 (CYANOCOBALAMIN) 1000 MCG tablet [id number]  Take 1,000 mcg by mouth daily. [provider]  Active Multiple Informants  Vitamin D, Ergocalciferol, (DRISDOL) 1.25 MG (50000 UNIT) CAPS capsule [id number]  Take 50,000 Units by mouth once a week. [provider]  Active Multiple Informants           Med Note Payton Doughty   EVO Jul 27, 2020  4:35 PM) New med 07/24/2020 - not yet taken          Fall/Depression Screening:  Fall Risk  07/28/2019 01/11/2019 04/11/2018  Falls in the past year? 1 0 No  Number falls in past yr: 1 - -  Comment 6 - -  Injury with Fall? 0 - -  Follow up Education provided - -   [facility] 2/9 Scores 08/28/2020 08/14/2020 07/27/2020 05/21/2020 03/07/2020 11/13/2019 11/03/2019  PHQ - 2 Score 5 6 5 5 5 6 4   PHQ- 9 Score 16 18 18 16 17 16 15     Assessment:  Goals Addressed            This Visit's Progress   . Psychiatry and therapy treatment for depression   On track    [place] (see longitudinal plan of care for additional care plan information)  Current Barriers:  . Patient recently diagnosed with major  depressive disorder with psychotic features while hospitalized. . Transportation - Due to husband's treatment, he is not always available to transport patient to her appointments. Update 08/28/2020: Transportation issues still create barriers for patient. She is aware that she can contact her Medicaid plan, Healthy [employer], for transportation assistance. . Mental Health Concerns  . Patient's husband has cancer and is currently receiving treatment at the [facility] in [place]. . Suicidal Ideation/Homicidal Ideation: No  Clinical Social Work Goal(s):  Marland Kitchen Over the next 60 days, patient will work with SW bi-weekly by telephone or in person to reduce or manage symptoms related to depression. . Over the next 30 days, patient will attend all scheduled mental health appointments: as scheduled. Patient will arrange transportation needs in the appropriate time frame to ensure she has transportation for scheduled appointments.  . Over the next 30 days, patient will demonstrate improved health management independence as evidenced by symptom management.   Interventions: . Patient interviewed and appropriate assessments performed: PHQ 9 . Patient interviewed and appropriate assessments performed . Provided patient with information about depression and physical illness. . Advised patient to discuss outpatient therapy with psychiatrist. Patient is scheduled for psychiatry at [facility] on 08/15/2020. LCSW encouraged patient to discuss therapy needs with the psychiatrist during this appointment so that she can receive both psychiatry and outpatient therapy  at the same agency.  . Begin therapy and attend scheduled appointments. Marland Kitchen Update 08/28/2020: Patient stated she had to cancel the appointment due to transportation issues and reschedule for today, 08/28/2020 @ 2:30pm. She stated she would like to discuss therapy needs at that time.  Marland Kitchen Emotional/Supportive Counseling  Patient Self Care Activities:  . Self administers  medications as prescribed . Attends church or other social activities . Ability for insight . Motivation for treatment . Strong family or social support  Patient Coping Strengths:  . Supportive Relationships . Family . Friends . Church . [employer] . Spirituality . Hopefulness  Patient Self Care Deficits:  . Unable to independently get around without assistance due to arthritis all over her body. . Unable to perform ADLs independently . Unable to perform IADLs independently . Husband has cancer. . Transportation issues due to husband receiving treatment for cancer.   Please see past updates related to this goal by clicking on the "Past Updates" button in the selected goal        Plan: LCSW will follow up in 30 days.  Netta Neat, BSW, MSW, LCSW Social Work Case Freight forwarder - [place]  Direct [place]: [phone]

## 2020-09-03 ENCOUNTER — Other Ambulatory Visit: Payer: Self-pay | Admitting: Internal Medicine

## 2020-09-03 ENCOUNTER — Encounter: Payer: Medicaid Other | Admitting: Physical Therapy

## 2020-09-03 MED FILL — ACCU-CHEK AVIVA PLUS TEST S: 30 days supply | Qty: 100 | Fill #1

## 2020-09-03 MED FILL — hydrALAZINE HCL 50 MG TABS: 50 | 90 days supply | Qty: 270 | Fill #1

## 2020-09-03 MED FILL — METOPROLOL TARTRATE 75 MG T: 75 | 90 days supply | Qty: 180 | Fill #1

## 2020-09-03 MED FILL — ESOMEPRAZOLE MAG DR 40 MG C: 40 | 30 days supply | Qty: 60 | Fill #1

## 2020-09-03 MED FILL — BuPROPion HCL ER (XL) 300 M: 300 | 90 days supply | Qty: 90 | Fill #1

## 2020-09-03 MED FILL — MONTELUKAST SOD 10 MG TAB: 10 | 90 days supply | Qty: 90 | Fill #1

## 2020-09-03 MED FILL — CELECOXIB 100 MG CAP: 100 | 30 days supply | Qty: 60 | Fill #1

## 2020-09-03 MED FILL — FAMOTIDINE 20 MG TABS: 20 | 30 days supply | Qty: 60 | Fill #3

## 2020-09-03 MED FILL — FLOVENT HFA 220 MCG INHALER: 220 | 30 days supply | Qty: 12 | Fill #1

## 2020-09-04 MED FILL — GABAPENTIN 300 MG CAPSULE: 300 | 15 days supply | Qty: 60 | Fill #0

## 2020-09-10 ENCOUNTER — Other Ambulatory Visit: Payer: Self-pay

## 2020-09-10 NOTE — Patient Instructions (Signed)
Visit Information  Robin [name] was given information about Medicaid Managed Care team care coordination services as a part of their Healthy [facility] Medicaid benefit. Robin [name] verbally consented to engagement with the [facility] Managed Care team.   For questions related to your Healthy [facility] health plan, please call: [phone] or visit the homepage here: [email or web address]  If you would like to schedule transportation through your Healthy [facility] plan, please call the following number at least 2 days in advance of your appointment: [phone]  Goals Addressed            This Visit's Progress   . Medication Management       Medicaid Managed Care CARE PLAN ENTRY (see longitudinal plan of care for additional care plan information)  Current Barriers:  . Social, financial, community barriers:  o Patient can't drive, must rely on husband, which makes it hard to come to visits which makes it hard to manage care o Doesn't understand/know what medications are for o Can't afford some medications o Patient can't walk due to pain. This causes her stress and she is unable to lose weight due to not being able to walk. Because of this, she gains more weight and this creates a cycle . Patient with complex health conditions multiple comorbidities including HTN, DM, and allergies . Self-manages medications. Does not use a pill box or other adherence strategies . [place], [address] [place] [address] [place] [address] Phone: [phone] Fax: [phone]  [id number] [name], [address] [place] [address] Phone: [phone] Fax: [phone]     Pharmacist Clinical Goal(s):  Marland Kitchen Over the next 180 days, patient will work with PharmD and provider towards optimized medication management  Interventions: . Comprehensive medication  review performed; medication list updated in electronic medical record . Inter-disciplinary care team collaboration (see longitudinal plan of care)  Mood -Patient is very stressed due to ambulation, husband sick. She also states she's sad because she imagined retirement would be her and her husband traveling a lot, instead they have been sick and only able to travel to the hospital or to [place], where she grew up Amitriptyline HS Bupropion 300mg  XL AM Duloxetine 30mg  QD Plan: Patient states she's taking too many medications and doesn't want to add or remove anything just yet. Will f/u  GERD Famotidine Esomeprazole Tums Plan: Taper down off Tums, she's taking 4x/day. This is causing rebound GERD I believe  Cholesterol Lab Results  Component Value Date   CHOL 141 10/25/2019   CHOL 197 04/10/2019   CHOL 191 12/16/2015   Lab Results  Component Value Date   HDL 58 10/25/2019   HDL 60 04/10/2019   HDL 84 12/16/2015   Lab Results  Component Value Date   LDLCALC 71 10/25/2019   LDLCALC 114 (H) 04/10/2019   LDLCALC 94 12/16/2015   Lab Results  Component Value Date   TRIG 58 10/25/2019   TRIG 116 04/10/2019   TRIG 65 12/16/2015   Lab Results  Component Value Date   CHOLHDL 2.4 10/25/2019   CHOLHDL 3.3 04/10/2019   CHOLHDL 2.3 12/16/2015   No results found for: LDLDIRECT  Atorvastatin 40mg  Plan: Patient stable and at goal, maintain therapy   Pain Both knees and both hips Pain Scale: 09/10/20: 8. Has days when she can't do things, can't walk due to it Gabapentin 300mg  QID Oxy/Acetaminophen 10mg -325 BID Lyrica  Plan: Stop Gabapentin. Was only prescribed by PCP since couldn't afford Lyrica. Got Pregabalin for patient so told her to stop Gabapentin. Also, will work with MM to find a way for patient to try water aerobics to help with joints. Would like new referral to pain doctor  Thyroid Lab Results  Component Value Date   TSH 1.697 07/29/2020    Levothyroxine  66mcg QD Plan: At goal, maintain therapy  IBS Linzess 290mg  QD Plan: At goal, maintain therapy  DM Lab Results  Component Value Date   HGBA1C 6.7 (H) 02/15/2020   HGBA1C 6.5 (H) 11/13/2019   HGBA1C 5.9 (H) 04/10/2019   Lab Results  Component Value Date   LDLCALC 71 10/25/2019   CREATININE 0.91 07/27/2020   Metformin 1000mg  BID Plan: Sugars doing great, maintain therapy  Cardio Home BP: 138/65 Tests daily BP Readings from Last 3 Encounters:  07/30/20 (!) 147/66  07/23/20 110/82  06/17/20 136/76    Spironolactone 25mg  QD Furosemide 40mg  Hydralazine Losartan Metoprolol Plan: Maintain therapy, at goal  Allergies Chlorpheniramine Fluticasone nasal spray Monteleukast 10mg  Plan: Recommended 2nd gen allergy pill like Fexofenadine  Patient Self Care Activities:  . Patient will take medications as prescribed . Patient will focus on improved adherence by January   Initial goal documentation  [place] Constable, Pharm.D., Managed Medicaid Pharmacist - [phone]         Please see education materials related to GERD provided as print materials.   Patient verbalizes understanding of instructions provided today.   The Managed Medicaid care management team will reach out to the patient again over the next 180 days.   Lane Hacker, [facility]  Gastroesophageal Reflux Disease, Adult Gastroesophageal reflux (GER) happens when acid from the stomach flows up into the tube that connects the mouth and the stomach (esophagus). Normally, food travels down the esophagus and stays in the stomach to be digested. With GER, food and stomach acid sometimes move back up into the esophagus. You may have a disease called gastroesophageal reflux disease (GERD) if the reflux:  Happens often.  Causes frequent or very bad symptoms.  Causes problems such as damage to the esophagus. When this happens, the esophagus becomes sore and swollen (inflamed). Over time, GERD can make small holes  (ulcers) in the lining of the esophagus. What are the causes? This condition is caused by a problem with the muscle between the esophagus and the stomach. When this muscle is weak or not normal, it does not close properly to keep food and acid from coming back up from the stomach. The muscle can be weak because of:  Tobacco use.  Pregnancy.  Having a certain type of hernia (hiatal hernia).  Alcohol use.  Certain foods and drinks, such as coffee, chocolate, onions, and peppermint. What increases the risk? You are more likely to develop this condition if you:  Are overweight.  Have a disease that affects your connective tissue.  Use NSAID medicines. What are the signs or symptoms? Symptoms of this condition include:  Heartburn.  Difficult or painful swallowing.  The feeling of having a lump in the throat.  A bitter taste in the mouth.  Bad breath.  Having a lot of saliva.  Having an upset or bloated stomach.  Belching.  Chest pain. Different conditions can cause chest pain. Make sure you see your doctor if you have chest pain.  Shortness of breath or noisy breathing (wheezing).  Ongoing (chronic) cough or a cough at night.  Wearing away of the  surface of teeth (tooth enamel).  Weight loss. How is this treated? Treatment will depend on how bad your symptoms are. Your doctor may suggest:  Changes to your diet.  Medicine.  Surgery. Follow these instructions at home: Eating and drinking   Follow a diet as told by your doctor. You may need to avoid foods and drinks such as: ? Coffee and tea (with or without caffeine). ? Drinks that contain alcohol. ? Energy drinks and sports drinks. ? Bubbly (carbonated) drinks or sodas. ? Chocolate and cocoa. ? Peppermint and mint flavorings. ? Garlic and onions. ? Horseradish. ? Spicy and acidic foods. These include peppers, chili powder, curry powder, vinegar, hot sauces, and BBQ sauce. ? Citrus fruit juices and  citrus fruits, such as oranges, lemons, and limes. ? Tomato-based foods. These include red sauce, chili, salsa, and pizza with red sauce. ? Fried and fatty foods. These include donuts, french fries, potato chips, and high-fat dressings. ? High-fat meats. These include hot dogs, rib eye steak, sausage, ham, and bacon. ? High-fat dairy items, such as whole milk, butter, and cream cheese.  Eat small meals often. Avoid eating large meals.  Avoid drinking large amounts of liquid with your meals.  Avoid eating meals during the 2-3 hours before bedtime.  Avoid lying down right after you eat.  Do not exercise right after you eat. Lifestyle   Do not use any products that contain nicotine or tobacco. These include cigarettes, e-cigarettes, and chewing tobacco. If you need help quitting, ask your doctor.  Try to lower your stress. If you need help doing this, ask your doctor.  If you are overweight, lose an amount of weight that is healthy for you. Ask your doctor about a safe weight loss goal. General instructions  Pay attention to any changes in your symptoms.  Take over-the-counter and prescription medicines only as told by your doctor. Do not take aspirin, ibuprofen, or other NSAIDs unless your doctor says it is okay.  Wear loose clothes. Do not wear anything tight around your waist.  Raise (elevate) the head of your bed about 6 inches (15 cm).  Avoid bending over if this makes your symptoms worse.  Keep all follow-up visits as told by your doctor. This is important. Contact a doctor if:  You have new symptoms.  You lose weight and you do not know why.  You have trouble swallowing or it hurts to swallow.  You have wheezing or a cough that keeps happening.  Your symptoms do not get better with treatment.  You have a hoarse voice. Get help right away if:  You have pain in your arms, neck, jaw, teeth, or back.  You feel sweaty, dizzy, or light-headed.  You have chest pain  or shortness of breath.  You throw up (vomit) and your throw-up looks like blood or coffee grounds.  You pass out (faint).  Your poop (stool) is bloody or black.  You cannot swallow, drink, or eat. Summary  If a person has gastroesophageal reflux disease (GERD), food and stomach acid move back up into the esophagus and cause symptoms or problems such as damage to the esophagus.  Treatment will depend on how bad your symptoms are.  Follow a diet as told by your doctor.  Take all medicines only as told by your doctor. This information is not intended to replace advice given to you by your health care provider. Make sure you discuss any questions you have with your health care provider. Document Revised:  03/30/2018 Document Reviewed: 03/30/2018 Elsevier Patient Education  [place].

## 2020-09-16 ENCOUNTER — Other Ambulatory Visit: Payer: Self-pay | Admitting: Internal Medicine

## 2020-09-16 ENCOUNTER — Other Ambulatory Visit: Payer: Self-pay | Admitting: *Deleted

## 2020-09-16 MED ORDER — LEVOTHYROXINE SODIUM 88 MCG PO TABS
88.0000 ug | ORAL_TABLET | Freq: Every day | ORAL | 2 refills | Status: DC
Start: 2020-09-16 — End: 2020-12-16

## 2020-09-16 MED FILL — LEVOTHYROXINE 88 MCG TABLET: 88 | 30 days supply | Qty: 30 | Fill #0

## 2020-09-23 DIAGNOSIS — R748 Abnormal levels of other serum enzymes: Secondary | ICD-10-CM | POA: Diagnosis not present

## 2020-09-23 DIAGNOSIS — E559 Vitamin D deficiency, unspecified: Secondary | ICD-10-CM | POA: Diagnosis not present

## 2020-09-23 DIAGNOSIS — Z131 Encounter for screening for diabetes mellitus: Secondary | ICD-10-CM | POA: Diagnosis not present

## 2020-09-23 DIAGNOSIS — M129 Arthropathy, unspecified: Secondary | ICD-10-CM | POA: Diagnosis not present

## 2020-09-23 DIAGNOSIS — R0602 Shortness of breath: Secondary | ICD-10-CM | POA: Diagnosis not present

## 2020-09-23 DIAGNOSIS — E1165 Type 2 diabetes mellitus with hyperglycemia: Secondary | ICD-10-CM | POA: Diagnosis not present

## 2020-09-23 DIAGNOSIS — Z13228 Encounter for screening for other metabolic disorders: Secondary | ICD-10-CM | POA: Diagnosis not present

## 2020-09-23 DIAGNOSIS — R350 Frequency of micturition: Secondary | ICD-10-CM | POA: Diagnosis not present

## 2020-09-23 DIAGNOSIS — Z114 Encounter for screening for human immunodeficiency virus [HIV]: Secondary | ICD-10-CM | POA: Diagnosis not present

## 2020-09-23 DIAGNOSIS — Z13220 Encounter for screening for lipoid disorders: Secondary | ICD-10-CM | POA: Diagnosis not present

## 2020-09-23 DIAGNOSIS — Z0000 Encounter for general adult medical examination without abnormal findings: Secondary | ICD-10-CM | POA: Diagnosis not present

## 2020-09-23 DIAGNOSIS — R5383 Other fatigue: Secondary | ICD-10-CM | POA: Diagnosis not present

## 2020-09-23 DIAGNOSIS — Z1159 Encounter for screening for other viral diseases: Secondary | ICD-10-CM | POA: Diagnosis not present

## 2020-09-24 ENCOUNTER — Other Ambulatory Visit: Payer: Self-pay | Admitting: *Deleted

## 2020-09-24 ENCOUNTER — Other Ambulatory Visit: Payer: Self-pay

## 2020-09-24 NOTE — Patient Outreach (Signed)
Care Coordination - Case Manager  09/24/2020  Robin [name] [birth year] [id number]  Subjective:  Robin [name] is an 64 y.o. year old female who is a primary patient of Robin Pier, MD.  Robin [name] was given information about Medicaid Managed Care team care coordination services today. Robin [name] agreed to services and verbal consent obtained  Review of patient status, laboratory and other test data was performed as part of evaluation for provision of services.  SDOH: SDOH Screenings   Alcohol Screen: Low Risk   . Last Alcohol Screening Score (AUDIT): 0  Depression (PHQ2-9): Medium Risk  . PHQ-2 Score: 16  Financial Resource Strain: Low Risk   . Difficulty of Paying Living Expenses: Not very hard  Food Insecurity: No Food Insecurity  . Worried About Charity fundraiser in the Last Year: Never true  . Ran Out of Food in the Last Year: Never true  Housing: Low Risk   . Last Housing Risk Score: 0  Physical Activity: Inactive  . Days of Exercise per Week: 0 days  . Minutes of Exercise per Session: 0 min  Social Connections: Socially Integrated  . Frequency of Communication with Friends and Family: More than three times a week  . Frequency of Social Gatherings with Friends and Family: More than three times a week  . Attends Religious Services: More than 4 times per year  . Active Member of Clubs or Organizations: Yes  . Attends Archivist Meetings: More than 4 times per year  . Marital Status: Married  Stress: No Stress Concern Present  . Feeling of Stress : Only a little  Tobacco Use: Medium Risk  . Smoking Tobacco Use: Former Smoker  . Smokeless Tobacco Use: Never Used  Transportation Needs: Unmet Transportation Needs  . Lack of Transportation (Medical): Yes  . Lack of Transportation (Non-Medical): Yes     Objective:    Allergies  Allergen Reactions  . Celexa [Citalopram Hydrobromide] Other (See Comments)    "Made me feel out of it"  .  Gabapentin Other (See Comments)    Contributed to lower extremity edema  . Norvasc [Amlodipine Besylate] Other (See Comments)    Edema in lower extremities  . Diflucan [Fluconazole] Rash    Medications:    Medications Reviewed Today    Reviewed by Robin Montane, RN (Registered Nurse) on 09/24/20 at 84  Med List Status: <None>  Medication Order Taking? Sig Documenting Provider Last Dose Status Informant  ACCU-CHEK AVIVA PLUS test strip [id number] Yes USE AS DIRECTED Robin Pier, MD Taking Active Multiple Informants  ACCU-CHEK AVIVA PLUS test strip [id number]  USE AS INSTRUCTED  Patient not taking: Reported on 09/10/2020   Robin Pier, MD  Active Multiple Informants  acetaminophen (TYLENOL) 500 MG tablet [id number] Yes Take 1,000 mg by mouth 2 (two) times daily.  [provider] Taking Active Multiple Informants  albuterol (VENTOLIN HFA) 108 (90 Base) MCG/ACT inhaler [id number] Yes Inhale 1-2 puffs into the lungs every 6 (six) hours as needed for wheezing or shortness of breath. Orvan July, NP Taking Active Multiple Informants  amitriptyline (ELAVIL) 50 MG tablet [id number] Yes TAKE 1 TABLET BY MOUTH AT BEDTIME.  Patient taking differently: Take 50 mg by mouth at bedtime.   Robin Pier, MD Taking Active Multiple Informants  aspirin EC 81 MG tablet [id number] Yes Take 81 mg by mouth at bedtime. [provider] Taking Active Multiple Informants  atorvastatin (LIPITOR) 40 MG tablet  [id number] Yes Take 40 mg by mouth daily. [provider] Taking Active Multiple Informants  azelastine (ASTELIN) 0.1 % nasal spray [id number] Yes 1-2 sprays per nostril 2 times daily as needed  Patient taking differently: Place 1 spray into both nostrils 2 (two) times daily.   Bobbitt, Sedalia Muta, MD Taking Active Multiple Informants  buPROPion (WELLBUTRIN XL) 300 MG 24 hr tablet [id number] Yes TAKE 1 TABLET (300 MG TOTAL) BY MOUTH EVERY MORNING.  Patient taking  differently: Take 300 mg by mouth daily.   Robin Pier, MD Taking Active Multiple Informants  CALCIUM CARBONATE ANTACID PO [id number] Yes Take 2 tablets by mouth See admin instructions. Take 2 tablets by mouth every morning, may also take 2 tablets at night as needed for acid reflux [provider] Taking Active Multiple Informants           Med Note Lane Hacker   Tue Sep 10, 2020  2:30 PM) Tapering off  celecoxib (CELEBREX) 100 MG capsule [id number] Yes Take 1 capsule (100 mg total) by mouth 2 (two) times daily. Robin Pier, MD Taking Active Multiple Informants  CINNAMON PO [id number] Yes Take 1 capsule by mouth daily. [provider] Taking Active Multiple Informants  conjugated estrogens (PREMARIN) vaginal cream [id number] Yes Place 1 Applicatorful vaginally daily as needed (vaginal dryness/itching). [provider] Taking Active Multiple Informants  diclofenac sodium (VOLTAREN) 1 % GEL [id number] Yes Apply 2-4 g topically 4 (four) times daily as needed (pain). [provider] Taking Active Multiple Informants           Med Note (Bora Bost A   Tue Sep 24, 2020 11:12 AM)    DULoxetine (CYMBALTA) 30 MG capsule [id number] Yes TAKE 1 CAPSULE (30 MG TOTAL) BY MOUTH DAILY. Robin Pier, MD Taking Active   esomeprazole (NEXIUM) 40 MG capsule [id number] Yes Take 1 capsule (40 mg total) by mouth 2 (two) times daily before a meal. Mahala Menghini, PA-C Taking Active Multiple Informants  famotidine (PEPCID) 20 MG tablet [id number]  Take 1 tablet (20 mg total) by mouth 2 (two) times daily as needed for heartburn or indigestion.  Patient taking differently: Take 20 mg by mouth 2 (two) times daily.    Mahala Menghini, PA-C  Active Multiple Informants  fluticasone (FLONASE) 50 MCG/ACT nasal spray [id number] Yes Place 1 spray into both nostrils daily.  Patient taking differently: Place 1 spray into both nostrils daily as needed for allergies or rhinitis.    Orvan July, NP Taking Active Multiple Informants  fluticasone (FLOVENT HFA) 220 MCG/ACT inhaler [id number] Yes 2 inhalations twice daily  Patient taking differently: Inhale 2 puffs into the lungs 2 (two) times daily as needed (shortness of breath/wheezing).   Bobbitt, Sedalia Muta, MD Taking Active Multiple Informants           Med Note Payton Doughty   KPQ Jul 27, 2020  4:36 PM) Pt states that she only took once - when she could not find her albuterol  furosemide (LASIX) 40 MG tablet [id number] Yes TAKE 1/2 TO 1 TABLET BY MOUTH DAILY FOR LOWER EXTREMITY SWELLING  Patient taking differently: Take 20 mg by mouth daily.   Robin Pier, MD Taking Active Multiple Informants  gabapentin (NEURONTIN) 300 MG capsule [id number] No Take 2 capsules (600 mg total) by mouth 2 (two) times daily.  Patient not taking: No sig reported   Tanda Rockers, MD Not Taking Active  Med Note Lane Hacker   Tue Sep 10, 2020  2:32 PM) Told to stop due to interaction with Lyrica  glucose blood test strip [id number] Yes USE AS INSTRUCTED [provider] Taking Active Multiple Informants  hydrALAZINE (APRESOLINE) 50 MG tablet [id number] Yes Take 1 tablet (50 mg total) by mouth 3 (three) times daily.  Patient taking differently: Take 50 mg by mouth 2 (two) times daily.   Erlene Quan, PA-C Taking Active Multiple Informants           Med Note Lane Hacker   Tue Sep 10, 2020  2:23 PM) Confirmed she's taking BID  hydrochlorothiazide (HYDRODIURIL) 25 MG tablet [id number] Yes Take 25 mg by mouth daily. [provider] Taking Active Multiple Informants           Med Note Lane Hacker   Tue Sep 10, 2020  2:24 PM)    levothyroxine (SYNTHROID) 88 MCG tablet [id number] Yes Take 1 tablet (88 mcg total) by mouth daily. Robin Pier, MD Taking Active   LINZESS 290 MCG CAPS capsule [id number] Yes TAKE 1 CAPSULE BY MOUTH DAILY BEFORE BREAKFAST.  Patient taking differently: Take  290 mcg by mouth daily.   Carlis Stable, NP Taking Active Multiple Informants  losartan (COZAAR) 25 MG tablet [id number] Yes Take 25 mg by mouth daily. [provider] Taking Active Multiple Informants  metFORMIN (GLUCOPHAGE) 1000 MG tablet [id number] Yes Take 1,000 mg by mouth 2 (two) times daily. [provider] Taking Active Multiple Informants  Metoprolol Tartrate 75 MG TABS [id number] Yes TAKE 1 TABLET BY MOUTH TWICE DAILY.  Patient taking differently: Take 75 mg by mouth 2 (two) times daily.   Robin Pier, MD Taking Active Multiple Informants  montelukast (SINGULAIR) 10 MG tablet [id number] Yes TAKE 1 TABLET BY MOUTH AT BEDTIME. Robin Pier, MD Taking Active Multiple Informants           Med Note Elizbeth Squires Sep 10, 2020  2:19 PM)    Multiple Vitamin (MULTIVITAMIN WITH MINERALS) TABS tablet [id number] Yes Take 1 tablet by mouth daily.  [provider] Taking Active Multiple Informants  Omega-3 Fatty Acids (FISH OIL) 1000 MG CAPS [id number] Yes Take 1,000 mg by mouth at bedtime.  [provider] Taking Active Multiple Informants  ondansetron (ZOFRAN) 4 MG tablet [id number] Yes Take 4 mg by mouth every 8 (eight) hours as needed for nausea or vomiting.  [provider] Taking Active Multiple Informants           Med Note Elizbeth Squires Sep 10, 2020  2:22 PM) PRN  OVER THE COUNTER MEDICATION [id number] Yes Take 1 capsule by mouth daily. BEET ROOT [provider] Taking Active Multiple Informants  pregabalin (LYRICA) 25 MG capsule [id number] Yes Take 25 mg by mouth 2 (two) times daily.  [provider] Taking Active Multiple Informants           Med Note Elizbeth Squires Sep 10, 2020  2:27 PM) Patient is taking Pregabalin, she didn't realize this was Lyrica  spironolactone (ALDACTONE) 25 MG tablet [id number] Yes Take 25 mg by mouth daily. [provider] Taking Active   tiZANidine (ZANAFLEX) 4  MG tablet [id number] Yes Take 4 mg by mouth 2 (two) times daily as needed for muscle spasms. [provider] Taking Active Multiple Informants  TRULICITY 6.20 BT/5.9RC SOPN [id number] Yes Inject 0.75 mg into the skin  once a week.  [provider] Taking Active Multiple Informants           Med Note Payton Doughty   HCW Jul 27, 2020  4:34 PM) New med 07/24/2020 - not yet taken  valACYclovir (VALTREX) 500 MG tablet [id number] No Take 500 mg by mouth 2 (two) times daily.  Patient not taking: No sig reported   [provider] Not Taking Active Multiple Informants           Med Note Payton Doughty   Sat Jul 27, 2020  4:46 PM) 7 day course filled 03/17/2020 and 04/09/2020  vitamin B-12 (CYANOCOBALAMIN) 1000 MCG tablet [id number] Yes Take 1,000 mcg by mouth daily. [provider] Taking Active Multiple Informants  Vitamin D, Ergocalciferol, (DRISDOL) 1.25 MG (50000 UNIT) CAPS capsule [id number] Yes Take 50,000 Units by mouth once a week. [provider] Taking Active Multiple Informants           Med Note Payton Doughty   IOE Jul 27, 2020  4:35 PM) New med 07/24/2020 - not yet taken          Assessment:   Patient Care Plan: Social Work - Depression (Adult)      Patient Care Plan: Weight loss to Improve Health    Problem Identified: Weight Management     Long-Range Goal: Achieving Weight Loss   Start Date: 08/22/2020  Expected End Date: 11/12/2020  This Visit's Progress: On track  Recent Progress: On track  Priority: High  Note:    Current Barriers:  Marland Kitchen Knowledge Deficits related to Weight loss. Ms. Selke reports that she needs to work on weight loss. She is needing knee surgery, but needs to lose weight before having the procedure. . Unable to independently develop a plan for weight loss   Nurse Case Manager Clinical Goal(s):  Marland Kitchen Over the next 30 days, patient will verbalize understanding of plan for weight management-Met-Ms Hollinsworth reports  eating most meals at home, prepared by her husband. They have cut out all potatoes, rice and pasta, eating whole wheat bread and increased vegetables. She has been unable to exercise due to knee pain and is interested in Aquatic Therapy. . Over the next 60 days, patient will meet with RN Care Manager to address ways to achieve weight loss . Over the next 30 days, patient will work with CM team pharmacist to review medications-Met  Interventions:  . Inter-disciplinary care team collaboration (see longitudinal plan of care) . Provided education to patient re: diet modification, diabetes and exercise  . Discussed plans with patient for ongoing care management follow up and provided patient with direct contact information for care management team . Pharmacy referral for medication review . RNCM explained to patient the importance of managing diabetes and how it can help with weight loss . Collaborate with PCP for Aquatic Therapy referral . Reviewed medications discussed that she has stopped taking gabapentin and she feels some improvement. . Discussed exercises that she can do while sitting . Reviewed upcoming appointments   Patient Goals/Self-Care Activities Over the next 30 days, patient will:  -Self administers medications as prescribed Attends all scheduled provider appointments Calls provider office for new concerns or questions - change to whole grain breads, cereal, pasta - drink 6 to 8 glasses of water each day - eat 3 to 5 servings of fruits and vegetables each day - eat 5 or 6 small meals each day - limit fast food meals to no more than  1 per week - manage portion size - Eat three small meals a day instead of 2 larger meals - Cut out regular soda - keep a food diary - reduce red meat to 2 to 3 times a week  Follow Up Plan: The Managed Medicaid care management team will reach out to the patient again over the next 45 days on 11/12/20 @ 10:30am        Plan: RNCM will follow up  with a telephone visit on 11/12/20 @ 10:30am Follow-up:  Patient agrees to Care Plan and Follow-up.

## 2020-09-24 NOTE — Patient Instructions (Signed)
Visit Information  Ms. Sirois was given information about Medicaid Managed Care team care coordination services as a part of their Healthy [facility] Medicaid benefit. [name] verbally consented to engagement with the [facility] Managed Care team.   For questions related to your Healthy [facility] health plan, please call: [phone] or visit the homepage here: [email or web address]  If you would like to schedule transportation through your Healthy [facility] plan,    Patient Care Plan: Weight loss to Improve Health    Problem Identified: Weight Management     Long-Range Goal: Achieving Weight Loss   Start Date: 08/22/2020  Expected End Date: 11/12/2020  This Visit's Progress: On track  Recent Progress: On track  Priority: High  Note:    Current Barriers:   Knowledge Deficits related to Weight loss. Ms. Blough reports that she needs to work on weight loss. She is needing knee surgery, but needs to lose weight before having the procedure.  Unable to independently develop a plan for weight loss   Nurse Case Manager Clinical Goal(s):   Over the next 30 days, patient will verbalize understanding of plan for weight management-Met-Ms Zeiders reports eating most meals at home, prepared by her husband. They have cut out all potatoes, rice and pasta, eating whole wheat bread and increased vegetables. She has been unable to exercise due to knee pain and is interested in Aquatic Therapy.  Over the next 60 days, patient will meet with RN Care Manager to address ways to achieve weight loss  Over the next 30 days, patient will work with CM team pharmacist to review medications-Met  Interventions:   Inter-disciplinary care team collaboration (see longitudinal plan of care)  Provided education to patient re: diet modification, diabetes and exercise   Discussed plans with patient for ongoing care management follow up and provided patient with direct contact  information for care management team  Pharmacy referral for medication review  RNCM explained to patient the importance of managing diabetes and how it can help with weight loss  Collaborate with PCP for Aquatic Therapy referral  Reviewed medications discussed that she has stopped taking gabapentin and she feels some improvement.  Discussed exercises that she can do while sitting  Reviewed upcoming appointments   Patient Goals/Self-Care Activities Over the next 30 days, patient will:  -Self administers medications as prescribed Attends all scheduled provider appointments Calls provider office for new concerns or questions - change to whole grain breads, cereal, pasta - drink 6 to 8 glasses of water each day - eat 3 to 5 servings of fruits and vegetables each day - eat 5 or 6 small meals each day - limit fast food meals to no more than 1 per week - manage portion size - Eat three small meals a day instead of 2 larger meals - Cut out regular soda - keep a food diary - reduce red meat to 2 to 3 times a week  Follow Up Plan: The Managed Medicaid care management team will reach out to the patient again over the next 45 days on 11/12/20 @ 10:30am       Please see education materials related to exercise provided by MyChart link.    Exercises To Do While Sitting  Exercises that you do while sitting (chair exercises) can give you many of the same benefits as full exercise. Benefits include strengthening your heart, burning calories, and keeping muscles and joints healthy. Exercise can also improve your mood and help with depression and anxiety. You  may benefit from chair exercises if you are unable to do standing exercises because of:  Diabetic foot pain.  Obesity.  Illness.  Arthritis.  Recovery from surgery or injury.  Breathing problems.  Balance problems.  Another type of disability. Before starting chair exercises, check with your health care provider or a  physical therapist to find out how much exercise you can tolerate and which exercises are safe for you. If your health care provider approves:  Start out slowly and build up over time. Aim to work up to about 10-20 minutes for each exercise session.  Make exercise part of your daily routine.  Drink water when you exercise. Do not wait until you are thirsty. Drink every 10-15 minutes.  Stop exercising right away if you have pain, nausea, shortness of breath, or dizziness.  If you are exercising in a wheelchair, make sure to lock the wheels.  Ask your health care provider whether you can do tai chi or yoga. Many positions in these mind-body exercises can be modified to do while seated. Warm-up Before starting other exercises: 1. Sit up as straight as you can. Have your knees bent at 90 degrees, which is the shape of the capital letter "L." Keep your feet flat on the floor. 2. Sit at the front edge of your chair, if you can. 3. Pull in (tighten) the muscles in your abdomen and stretch your spine and neck as straight as you can. Hold this position for a few minutes. 4. Breathe in and out evenly. Try to concentrate on your breathing, and relax your mind. Stretching Exercise A: Arm stretch 1. Hold your arms out straight in front of your body. 2. Bend your hands at the wrist with your fingers pointing up, as if signaling someone to stop. Notice the slight tension in your forearms as you hold the position. 3. Keeping your arms out and your hands bent, rotate your hands outward as far as you can and hold this stretch. Aim to have your thumbs pointing up and your pinkie fingers pointing down. Slowly repeat arm stretches for one minute as tolerated. Exercise B: Leg stretch 1. If you can move your legs, try to "draw" letters on the floor with the toes of your foot. Write your name with one foot. 2. Write your name with the toes of your other foot. Slowly repeat the movements for one minute as  tolerated. Exercise C: Reach for the sky 1. Reach your hands as far over your head as you can to stretch your spine. 2. Move your hands and arms as if you are climbing a rope. Slowly repeat the movements for one minute as tolerated. Range of motion exercises Exercise A: Shoulder roll 1. Let your arms hang loosely at your sides. 2. Lift just your shoulders up toward your ears, then let them relax back down. 3. When your shoulders feel loose, rotate your shoulders in backward and forward circles. Do shoulder rolls slowly for one minute as tolerated. Exercise B: March in place 1. As if you are marching, pump your arms and lift your legs up and down. Lift your knees as high as you can. ? If you are unable to lift your knees, just pump your arms and move your ankles and feet up and down. March in place for one minute as tolerated. Exercise C: Seated jumping jacks 1. Let your arms hang down straight. 2. Keeping your arms straight, lift them up over your head. Aim to point your fingers to  the ceiling. 3. While you lift your arms, straighten your legs and slide your heels along the floor to your sides, as wide as you can. 4. As you bring your arms back down to your sides, slide your legs back together. ? If you are unable to use your legs, just move your arms. Slowly repeat seated jumping jacks for one minute as tolerated. Strengthening exercises Exercise A: Shoulder squeeze 1. Hold your arms straight out from your body to your sides, with your elbows bent and your fists pointed at the ceiling. 2. Keeping your arms in the bent position, move them forward so your elbows and forearms meet in front of your face. 3. Open your arms back out as wide as you can with your elbows still bent, until you feel your shoulder blades squeezing together. Hold for 5 seconds. Slowly repeat the movements forward and backward for one minute as tolerated. Contact a health care provider if you:  Had to stop  exercising due to any of the following: ? Pain. ? Nausea. ? Shortness of breath. ? Dizziness. ? Fatigue.  Have significant pain or soreness after exercising. Get help right away if you have:  Chest pain.  Difficulty breathing. These symptoms may represent a serious problem that is an emergency. Do not wait to see if the symptoms will go away. Get medical help right away. Call your local emergency services (911 in the [place] Do not drive yourself to the hospital. This information is not intended to replace advice given to you by your health care provider. Make sure you discuss any questions you have with your health care provider. Document Revised: 01/12/2019 Document Reviewed: 08/04/2017 Elsevier Patient Education  2020 [employer].   Patient verbalizes understanding of instructions provided today.   Telephone follow up appointment with Managed Medicaid care management team member scheduled for:11/12/20 @ 10:30am  Melissa Montane, RN

## 2020-09-25 ENCOUNTER — Other Ambulatory Visit: Payer: Self-pay

## 2020-09-25 ENCOUNTER — Telehealth: Payer: Self-pay | Admitting: Internal Medicine

## 2020-09-25 ENCOUNTER — Ambulatory Visit: Payer: Self-pay

## 2020-09-25 DIAGNOSIS — R5381 Other malaise: Secondary | ICD-10-CM

## 2020-09-25 NOTE — Telephone Encounter (Signed)
-----   Message from Melissa Montane, RN sent at 09/24/2020 11:41 AM EST ----- Regarding: referral for aquatic therapy Hello,  I am the Managed Medicaid Case Manager working with Ms. Marlou Sa. She has expressed interest in aquatic therapy to increase mobility. Can you place a referral for this service? Thank you!  [name] RN, BSN [employer]  [employer] manager

## 2020-09-25 NOTE — Patient Outreach (Signed)
Care Coordination- Social Work  09/25/2020  Robin [name] 1956/08/12 [id number]  Subjective:    Robin [name] is an 64 y.o. year old female who is a primary patient of Robin Pier, MD.    Robin [name] was given information about Medicaid Managed Care team care coordination services today. Robin [name] agreed to services and verbal consent obtained  Review of patient status, laboratory and other test data was performed as part of evaluation for provision of services.  SDOH:   SDOH Screenings   Alcohol Screen: Low Risk   . Last Alcohol Screening Score (AUDIT): 0  Depression (PHQ2-9): Medium Risk  . PHQ-2 Score: 14  Financial Resource Strain: Low Risk   . Difficulty of Paying Living Expenses: Not very hard  Food Insecurity: No Food Insecurity  . Worried About Charity fundraiser in the Last Year: Never true  . Ran Out of Food in the Last Year: Never true  Housing: Low Risk   . Last Housing Risk Score: 0  Physical Activity: Inactive  . Days of Exercise per Week: 0 days  . Minutes of Exercise per Session: 0 min  Social Connections: Socially Integrated  . Frequency of Communication with Friends and Family: More than three times a week  . Frequency of Social Gatherings with Friends and Family: More than three times a week  . Attends Religious Services: More than 4 times per year  . Active Member of Clubs or Organizations: Yes  . Attends Archivist Meetings: More than 4 times per year  . Marital Status: Married  Stress: No Stress Concern Present  . Feeling of Stress : Only a little  Tobacco Use: Medium Risk  . Smoking Tobacco Use: Former Smoker  . Smokeless Tobacco Use: Never Used  Transportation Needs: Unmet Transportation Needs  . Lack of Transportation (Medical): Yes  . Lack of Transportation (Non-Medical): Yes   SDOH Interventions   Flowsheet Row Most Recent Value  SDOH Interventions   Food Insecurity Interventions Intervention Not Indicated   Financial Strain Interventions Intervention Not Indicated  Intimate Partner Violence Interventions Intervention Not Indicated  Stress Interventions Intervention Not Indicated  Depression Interventions/Treatment  Currently on Treatment, Referral to Psychiatry  [Patient was [facility] to [facility] for medication management and therapy. She had to reschedule previous appointment due to scheduling conflict and is scheduled to see the psychiatrist on 09/30/2020.]      Objective:    Medications:  Medications Reviewed Today    Reviewed by Robin Neat, LCSW (Social Worker) on 09/25/20 at 1412  Med List Status: <None>  Medication Order Taking? Sig Documenting Provider Last Dose Status Informant  ACCU-CHEK AVIVA PLUS test strip [id number]  USE AS DIRECTED Robin Pier, MD  Active Multiple Informants  ACCU-CHEK AVIVA PLUS test strip [id number]  USE AS INSTRUCTED  Patient not taking: Reported on 09/10/2020   Robin Pier, MD  Active Multiple Informants  acetaminophen (TYLENOL) 500 MG tablet [id number]  Take 1,000 mg by mouth 2 (two) times daily.  [provider]  Active Multiple Informants  albuterol (VENTOLIN HFA) 108 (90 Base) MCG/ACT inhaler [id number]  Inhale 1-2 puffs into the lungs every 6 (six) hours as needed for wheezing or shortness of breath. Loura Halt A, NP  Active Multiple Informants  amitriptyline (ELAVIL) 50 MG tablet [id number]  TAKE 1 TABLET BY MOUTH AT BEDTIME.  Patient taking differently: Take 50 mg by mouth at bedtime.   Robin Pier, MD  Active Multiple Informants  aspirin EC 81 MG tablet [id number]  Take 81 mg by mouth at bedtime. [provider]  Active Multiple Informants  atorvastatin (LIPITOR) 40 MG tablet [id number]  Take 40 mg by mouth daily. [provider]  Active Multiple Informants  azelastine (ASTELIN) 0.1 % nasal spray [id number]  1-2 sprays per nostril 2 times daily as needed  Patient taking differently: Place 1 spray into both  nostrils 2 (two) times daily.   Bobbitt, Heywood Iles, MD  Active Multiple Informants  buPROPion (WELLBUTRIN XL) 300 MG 24 hr tablet [id number] Yes TAKE 1 TABLET (300 MG TOTAL) BY MOUTH EVERY MORNING.  Patient taking differently: Take 300 mg by mouth daily.   Marcine Matar, MD Taking Active   CALCIUM CARBONATE ANTACID PO [id number]  Take 2 tablets by mouth See admin instructions. Take 2 tablets by mouth every morning, may also take 2 tablets at night as needed for acid reflux [provider]  Active Multiple Informants           Med Note Zettie Pho   Tue Sep 10, 2020  2:30 PM) Tapering off  celecoxib (CELEBREX) 100 MG capsule [id number]  Take 1 capsule (100 mg total) by mouth 2 (two) times daily. Marcine Matar, MD  Active Multiple Informants  CINNAMON PO [id number]  Take 1 capsule by mouth daily. [provider]  Active Multiple Informants  conjugated estrogens (PREMARIN) vaginal cream [id number]  Place 1 Applicatorful vaginally daily as needed (vaginal dryness/itching). [provider]  Active Multiple Informants  diclofenac sodium (VOLTAREN) 1 % GEL [id number]  Apply 2-4 g topically 4 (four) times daily as needed (pain). [provider]  Active Multiple Informants           Med Note (ROBB, MELANIE A   Tue Sep 24, 2020 11:12 AM)    DULoxetine (CYMBALTA) 30 MG capsule [id number] Yes TAKE 1 CAPSULE (30 MG TOTAL) BY MOUTH DAILY. Marcine Matar, MD Taking Active   esomeprazole (NEXIUM) 40 MG capsule [id number]  Take 1 capsule (40 mg total) by mouth 2 (two) times daily before a meal. Tiffany Kocher, PA-C  Active Multiple Informants  famotidine (PEPCID) 20 MG tablet [id number]  Take 1 tablet (20 mg total) by mouth 2 (two) times daily as needed for heartburn or indigestion.  Patient taking differently: Take 20 mg by mouth 2 (two) times daily.    Tiffany Kocher, PA-C  Active Multiple Informants  fluticasone (FLONASE) 50 MCG/ACT nasal spray [id number]   Place 1 spray into both nostrils daily.  Patient taking differently: Place 1 spray into both nostrils daily as needed for allergies or rhinitis.   Janace Aris, NP  Active Multiple Informants  fluticasone (FLOVENT HFA) 220 MCG/ACT inhaler [id number]  2 inhalations twice daily  Patient taking differently: Inhale 2 puffs into the lungs 2 (two) times daily as needed (shortness of breath/wheezing).   Bobbitt, Heywood Iles, MD  Active Multiple Informants           Med Note Gorden Harms   BCO Jul 27, 2020  4:36 PM) Pt states that she only took once - when she could not find her albuterol  furosemide (LASIX) 40 MG tablet [id number]  TAKE 1/2 TO 1 TABLET BY MOUTH DAILY FOR LOWER EXTREMITY SWELLING  Patient taking differently: Take 20 mg by mouth daily.   Marcine Matar, MD  Active Multiple Informants  gabapentin (NEURONTIN) 300 MG capsule [id number]  Take 2 capsules (600 mg total) by mouth  2 (two) times daily.  Patient not taking: No sig reported   Tanda Rockers, MD  Active            Med Note Lane Hacker   Tue Sep 10, 2020  2:32 PM) Told to stop due to interaction with Lyrica  glucose blood test strip [id number]  USE AS INSTRUCTED [provider]  Active Multiple Informants  hydrALAZINE (APRESOLINE) 50 MG tablet [id number]  Take 1 tablet (50 mg total) by mouth 3 (three) times daily.  Patient taking differently: Take 50 mg by mouth 2 (two) times daily.   Erlene Quan, PA-C  Active Multiple Informants           Med Note Lane Hacker   Tue Sep 10, 2020  2:23 PM) Confirmed she's taking BID  hydrochlorothiazide (HYDRODIURIL) 25 MG tablet [id number]  Take 25 mg by mouth daily. [provider]  Active Multiple Informants           Med Note Lane Hacker   Tue Sep 10, 2020  2:24 PM)    levothyroxine (SYNTHROID) 88 MCG tablet [id number]  Take 1 tablet (88 mcg total) by mouth daily. Robin Pier, MD  Active   LINZESS 290 MCG CAPS capsule [id number]  TAKE 1  CAPSULE BY MOUTH DAILY BEFORE BREAKFAST.  Patient taking differently: Take 290 mcg by mouth daily.   Carlis Stable, NP  Active Multiple Informants  losartan (COZAAR) 25 MG tablet [id number]  Take 25 mg by mouth daily. [provider]  Active Multiple Informants  metFORMIN (GLUCOPHAGE) 1000 MG tablet [id number]  Take 1,000 mg by mouth 2 (two) times daily. [provider]  Active Multiple Informants  Metoprolol Tartrate 75 MG TABS [id number]  TAKE 1 TABLET BY MOUTH TWICE DAILY.  Patient taking differently: Take 75 mg by mouth 2 (two) times daily.   Robin Pier, MD  Active Multiple Informants  montelukast (SINGULAIR) 10 MG tablet [id number]  TAKE 1 TABLET BY MOUTH AT BEDTIME. Robin Pier, MD  Active Multiple Informants           Med Note Elizbeth Squires Sep 10, 2020  2:19 PM)    Multiple Vitamin (MULTIVITAMIN WITH MINERALS) TABS tablet [id number]  Take 1 tablet by mouth daily.  [provider]  Active Multiple Informants  Omega-3 Fatty Acids (FISH OIL) 1000 MG CAPS [id number]  Take 1,000 mg by mouth at bedtime.  [provider]  Active Multiple Informants  ondansetron (ZOFRAN) 4 MG tablet [id number]  Take 4 mg by mouth every 8 (eight) hours as needed for nausea or vomiting.  [provider]  Active Multiple Informants           Med Note Lane Hacker   Tue Sep 10, 2020  2:22 PM) PRN  OVER THE COUNTER MEDICATION [id number]  Take 1 capsule by mouth daily. BEET ROOT [provider]  Active Multiple Informants  pregabalin (LYRICA) 25 MG capsule [id number]  Take 25 mg by mouth 2 (two) times daily.  [provider]  Active Multiple Informants           Med Note Elizbeth Squires Sep 10, 2020  2:27 PM) Patient is taking Pregabalin, she didn't realize this was Lyrica  spironolactone (ALDACTONE) 25 MG tablet [id number]  Take 25 mg by mouth daily. [provider]  Active   tiZANidine (ZANAFLEX) 4 MG tablet [id number]   Take 4 mg by  mouth 2 (two) times daily as needed for muscle spasms. [provider]  Active Multiple Informants  TRULICITY 0.09 FG/1.8EX SOPN [id number]  Inject 0.75 mg into the skin once a week.  [provider]  Active Multiple Informants           Med Note Payton Doughty   LFY Jul 27, 2020  4:34 PM) New med 07/24/2020 - not yet taken  valACYclovir (VALTREX) 500 MG tablet [id number]  Take 500 mg by mouth 2 (two) times daily.  Patient not taking: No sig reported   [provider]  Active Multiple Informants           Med Note Payton Doughty   Sat Jul 27, 2020  4:46 PM) 7 day course filled 03/17/2020 and 04/09/2020  vitamin B-12 (CYANOCOBALAMIN) 1000 MCG tablet [id number]  Take 1,000 mcg by mouth daily. [provider]  Active Multiple Informants  Vitamin D, Ergocalciferol, (DRISDOL) 1.25 MG (50000 UNIT) CAPS capsule [id number]  Take 50,000 Units by mouth once a week. [provider]  Active Multiple Informants           Med Note Payton Doughty   VQM Jul 27, 2020  4:35 PM) New med 07/24/2020 - not yet taken          Fall/Depression Screening:  Fall Risk  07/28/2019 01/11/2019 04/11/2018  Falls in the past year? 1 0 No  Number falls in past yr: 1 - -  Comment 6 - -  Injury with Fall? 0 - -  Follow up Education provided - -   [facility] 2/9 Scores 09/25/2020 08/28/2020 08/14/2020 07/27/2020 05/21/2020 03/07/2020 11/13/2019  PHQ - 2 Score $Remov'3 5 6 5 5 5 6  'zthAhO$ PHQ- 9 Score $Remov'14 16 18 18 16 17 16    'AYZkDD$ Assessment:  Patient Care Plan: Social Work - Depression (Adult)    Problem Identified: Depression Identification (Depression)     Goal: Psychiatry and Outpatient Therapy for Depressive Symptoms   Start Date: 08/28/2020  Expected End Date: 11/04/2020  This Visit's Progress: On track  Priority: High  Note:    Current Barriers:  . 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 . Transportation - Due to  husband's treatment, he is not always available to transport patient to her appointments.  Marland Kitchen Update 08/28/2020: Transportation issues still create barriers for patient. She is aware that she can contact her Medicaid plan, [employer], for transportation assistance. Marland Kitchen Update 09/25/2020: Patient was unable to keep previously scheduled appointment on 08/67/6195 due to conflict with husband's appointments. . Mental Health Concerns  . Patient's husband has cancer and is currently receiving treatment at the [facility] in [place]. . Suicidal Ideation/Homicidal Ideation: No  Clinical Social Work Goal(s):  Marland Kitchen Over the next 60 days, patient will work with SW bi-weekly by telephone or in person to reduce or manage symptoms related to depression. . Over the next 30 days, patient will attend all scheduled mental health appointments: as scheduled. Patient will arrange transportation needs in the appropriate time frame to ensure she has transportation for scheduled appointments.  . Over the next 30 days, patient will demonstrate improved health management independence as evidenced by symptom management.   Interventions: . Patient interviewed and appropriate assessments performed: PHQ 2 and PHQ 9 . Patient interviewed and appropriate assessments performed . Provided patient with information about depression and physical illness. . Advised patient to discuss outpatient therapy with psychiatrist. Patient is scheduled for psychiatry at  [facility] on 08/15/2020. LCSW encouraged patient to discuss therapy needs with the psychiatrist during this appointment so that she can receive both psychiatry and outpatient therapy at the same agency.  Marland Kitchen Update 08/28/2020: Patient stated she had to cancel the appointment due to transportation issues and reschedule for today, 08/28/2020 @ 2:30pm. She stated she would like to discuss therapy needs at that time.  Marland Kitchen Update 09/25/2020: Patient was unable to keep previously scheduled appointment on  [id number]/94/7654 due to conflict with husband's appointments. Appointment was rescheduled to Monday, 09/30/2020. Marland Kitchen Emotional/Supportive Counseling . Update 09/25/2020: LCSW will follow up on October 15, 2020 @ 2:00pm.  Patient Self Care Activities:  . Self administers medications as prescribed . Attends church or other social activities . Ability for insight . Motivation for treatment . Strong family or social support  Patient Coping Strengths:  . Supportive Relationships . Family . Friends . Church . [employer] . Spirituality . Hopefulness  Patient Self Care Deficits:  . Unable to independently get around without assistance due to arthritis all over her body. . Unable to perform ADLs independently . Unable to perform IADLs independently . Husband has cancer. . Transportation issues due to husband receiving treatment for cancer.  Please see past updates related to this goal by clicking on the "Past Updates" button in the selected goal    Task: Identify Depressive Symptoms and Facilitate Treatment     Patient Care Plan: Weight loss to Improve Health    Problem Identified: Weight Management     Long-Range Goal: Achieving Weight Loss   Start Date: 08/22/2020  Expected End Date: 11/12/2020  This Visit's Progress: On track  Recent Progress: On track  Priority: High  Note:    Current Barriers:  Marland Kitchen Knowledge Deficits related to Weight loss. Ms. Livengood reports that she needs to work on weight loss. She is needing knee surgery, but needs to lose weight before having the procedure. . Unable to independently develop a plan for weight loss   Nurse Case Manager Clinical Goal(s):  Marland Kitchen Over the next 30 days, patient will verbalize understanding of plan for weight management-Met-Ms Chiquito reports eating most meals at home, prepared by her husband. They have cut out all potatoes, rice and pasta, eating whole wheat bread and increased vegetables. She has been unable to exercise due to  knee pain and is interested in Aquatic Therapy. . Over the next 60 days, patient will meet with RN Care Manager to address ways to achieve weight loss . Over the next 30 days, patient will work with CM team pharmacist to review medications-Met  Interventions:  . Inter-disciplinary care team collaboration (see longitudinal plan of care) . Provided education to patient re: diet modification, diabetes and exercise  . Discussed plans with patient for ongoing care management follow up and provided patient with direct contact information for care management team . Pharmacy referral for medication review . RNCM explained to patient the importance of managing diabetes and how it can help with weight loss . Collaborate with PCP for Aquatic Therapy referral . Reviewed medications discussed that she has stopped taking gabapentin and she feels some improvement. . Discussed exercises that she can do while sitting . Reviewed upcoming appointments   Patient Goals/Self-Care Activities Over the next 30 days, patient will:  -Self administers medications as prescribed Attends all scheduled provider appointments Calls provider office for new concerns or questions - change to whole grain breads, cereal, pasta - drink 6 to 8 glasses of water each day -  eat 3 to 5 servings of fruits and vegetables each day - eat 5 or 6 small meals each day - limit fast food meals to no more than 1 per week - manage portion size - Eat three small meals a day instead of 2 larger meals - Cut out regular soda - keep a food diary - reduce red meat to 2 to 3 times a week  Follow Up Plan: The Managed Medicaid care management team will reach out to the patient again over the next 45 days on 11/12/20 @ 10:30am       Goals Addressed            This Visit's Progress   . Psychiatry and therapy treatment for depression       Patient will:   . Self administer medications as prescribed . Attend church or other social  activities . Attend appointments as scheduled . Have ability for insight . Continue motivation for treatment . Strong family or social support . Report changes in mood and/or symptoms to psychiatrist and therapist          Plan: LCSW will follow up with patient on 10/05/2020 @ 2:00pm.  Follow-up:  Patient agrees to Care Plan and Follow-up.   Robin [name], BSW, MSW, LCSW Social Work Case Freight forwarder - [place]  [employer]: [phone]

## 2020-09-25 NOTE — Patient Instructions (Signed)
Visit Information Robin [name], it was a pleasure speaking with you. If you have any questions, please do not hesitate to call me at [phone].   Robin [name] was given information about Medicaid Managed Care team care coordination services as a part of their Healthy [facility] Medicaid benefit. Robin [name] verbally consented to engagement with the [facility] Managed Care team.   For questions related to your Healthy [facility] health plan, please call: [phone] or visit the homepage here: [email or web address]  If you would like to schedule transportation through your Healthy [facility] plan, please call the following number at least 2 days in advance of your appointment: [phone]  Goals Addressed            This Visit's Progress   . Psychiatry and therapy treatment for depression       Patient will:   . Self administer medications as prescribed . Attend church or other social activities . Attend appointments as scheduled . Have ability for insight . Continue motivation for treatment . Strong family or social support . Report changes in mood and/or symptoms to psychiatrist and therapist         Patient Care Plan: Social Work - Depression (Adult)    Problem Identified: Depression Identification (Depression)     Goal: Psychiatry and Outpatient Therapy for Depressive Symptoms   Start Date: 08/28/2020  Expected End Date: 11/04/2020  This Visit's Progress: On track  Priority: High  Note:    Current Barriers:  . 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 . Transportation - Due to husband's treatment, he is not always available to transport patient to her appointments.  Robin [name] Kitchen Update 08/28/2020: Transportation issues still create barriers for patient. She is aware that she can contact her Medicaid plan, Healthy [employer], for transportation assistance. Robin [name] Kitchen Update 09/25/2020: Patient was unable  to keep previously scheduled appointment on 02/77/4128 due to conflict with husband's appointments. . Mental Health Concerns  . Patient's husband has cancer and is currently receiving treatment at the [place] in [place]. . Suicidal Ideation/Homicidal Ideation: No  Clinical Social Work Goal(s):  Robin [name] Kitchen Over the next 60 days, patient will work with SW bi-weekly by telephone or in person to reduce or manage symptoms related to depression. . Over the next 30 days, patient will attend all scheduled mental health appointments: as scheduled. Patient will arrange transportation needs in the appropriate time frame to ensure she has transportation for scheduled appointments.  . Over the next 30 days, patient will demonstrate improved health management independence as evidenced by symptom management.   Interventions: . Patient interviewed and appropriate assessments performed: PHQ 2 and PHQ 9 . Patient interviewed and appropriate assessments performed . Provided patient with information about depression and physical illness. . Advised patient to discuss outpatient therapy with psychiatrist. Patient is scheduled for psychiatry at [facility] on 08/15/2020. LCSW encouraged patient to discuss therapy needs with the psychiatrist during this appointment so that she can receive both psychiatry and outpatient therapy at the same agency.  Robin [name] Kitchen Update 08/28/2020: Patient stated she had to cancel the appointment due to transportation issues and reschedule for today, 08/28/2020 @ 2:30pm. She stated she would like to discuss therapy needs at that time.  Robin [name] Kitchen Update 09/25/2020: Patient was unable to keep previously scheduled appointment on 78/67/6720 due to conflict with husband's appointments. Appointment was rescheduled to Monday, 09/30/2020. Robin [name] Kitchen Emotional/Supportive Counseling . Update 09/25/2020: LCSW will follow up on October 15, 2020 @ 2:00pm.  Patient Self Care  Activities:  . Self administers medications as prescribed . Attends  church or other social activities . Ability for insight . Motivation for treatment . Strong family or social support  Patient Coping Strengths:  . Supportive Relationships . Family . Friends . Church . [employer] . Spirituality . Hopefulness  Patient Self Care Deficits:  . Unable to independently get around without assistance due to arthritis all over her body. . Unable to perform ADLs independently . Unable to perform IADLs independently . Husband has cancer. . Transportation issues due to husband receiving treatment for cancer.  Please see past updates related to this goal by clicking on the "Past Updates" button in the selected goal    Task: Identify Depressive Symptoms and Facilitate Treatment     Patient Care Plan: Weight loss to Improve Health    Problem Identified: Weight Management     Long-Range Goal: Achieving Weight Loss   Start Date: 08/22/2020  Expected End Date: 11/12/2020  This Visit's Progress: On track  Recent Progress: On track  Priority: High  Note:    Current Barriers:  Robin [name] Kitchen Knowledge Deficits related to Weight loss. Robin [name] reports that she needs to work on weight loss. She is needing knee surgery, but needs to lose weight before having the procedure. . Unable to independently develop a plan for weight loss   Nurse Case Manager Clinical Goal(s):  Robin [name] Kitchen Over the next 30 days, patient will verbalize understanding of plan for weight management-Met-Robin [name] reports eating most meals at home, prepared by her husband. They have cut out all potatoes, rice and pasta, eating whole wheat bread and increased vegetables. She has been unable to exercise due to knee pain and is interested in Aquatic Therapy. . Over the next 60 days, patient will meet with RN Care Manager to address ways to achieve weight loss . Over the next 30 days, patient will work with CM team pharmacist to review medications-Met  Interventions:  . Inter-disciplinary care team  collaboration (see longitudinal plan of care) . Provided education to patient re: diet modification, diabetes and exercise  . Discussed plans with patient for ongoing care management follow up and provided patient with direct contact information for care management team . Pharmacy referral for medication review . RNCM explained to patient the importance of managing diabetes and how it can help with weight loss . Collaborate with PCP for Aquatic Therapy referral . Reviewed medications discussed that she has stopped taking gabapentin and she feels some improvement. . Discussed exercises that she can do while sitting . Reviewed upcoming appointments   Patient Goals/Self-Care Activities Over the next 30 days, patient will:  -Self administers medications as prescribed Attends all scheduled provider appointments Calls provider office for new concerns or questions - change to whole grain breads, cereal, pasta - drink 6 to 8 glasses of water each day - eat 3 to 5 servings of fruits and vegetables each day - eat 5 or 6 small meals each day - limit fast food meals to no more than 1 per week - manage portion size - Eat three small meals a day instead of 2 larger meals - Cut out regular soda - keep a food diary - reduce red meat to 2 to 3 times a week  Follow Up Plan: The Managed Medicaid care management team will reach out to the patient again over the next 45 days on 11/12/20 @ 10:30am       Patient verbalizes understanding of instructions provided today.  Licensed Clinical Social Worker will follow up with patient on October 15, 2020 @ 2:00pm. The patient has been provided with contact information for the Managed Medicaid care management team and has been advised to call with any health related questions or concerns.   Netta Neat, LCSW Netta Neat, BSW, MSW, LCSW Social Work Case Freight forwarder - [place]  Direct [place]:  [phone]

## 2020-10-13 ENCOUNTER — Telehealth: Payer: Self-pay | Admitting: Internal Medicine

## 2020-10-13 NOTE — Telephone Encounter (Signed)
I received a phone call from psychiatrist Dr.Bijelac at [facility] on 10/12/2019 regarding this patient.  She states she has seen the patient twice and was told by patient that I was prescribing her psychiatric medications including Wellbutrin amitriptyline and Cymbalta.  Dr Rolena Infante is wanting to know whether I would like for her to take over prescribing psychiatric medications and whether the amitriptyline can be stopped and Wellbutrin tapered.  I told her that it is okay for the amitriptyline to be stopped.  She will taper the Wellbutrin and subsequently take over prescribing psychiatric medications for the patient.

## 2020-10-15 ENCOUNTER — Other Ambulatory Visit: Payer: Self-pay

## 2020-10-15 ENCOUNTER — Other Ambulatory Visit: Payer: Self-pay | Admitting: Internal Medicine

## 2020-10-15 ENCOUNTER — Ambulatory Visit: Payer: Self-pay

## 2020-10-15 DIAGNOSIS — I10 Essential (primary) hypertension: Secondary | ICD-10-CM

## 2020-10-15 MED FILL — ESOMEPRAZOLE MAG DR 40 MG C: 40 | 30 days supply | Qty: 60 | Fill #2

## 2020-10-15 MED FILL — AMITRIPTYLINE HCL 50 MG TAB: 50 | 90 days supply | Qty: 90 | Fill #0

## 2020-10-15 MED FILL — LEVOTHYROXINE 88 MCG TABLET: 88 | 30 days supply | Qty: 30 | Fill #1

## 2020-10-15 MED FILL — CELECOXIB 100 MG CAP: 100 | 30 days supply | Qty: 60 | Fill #2

## 2020-10-15 NOTE — Patient Instructions (Signed)
Visit Information Robin [name], it was a pleasure speaking with you today, and it's been a pleasure working with you. Congratulations on completing your Social Work goals. Please remember that the Medicaid Managed Care team RN Case Manager and Pharmacist will continue working with you.   Robin [name] was given information about Medicaid Managed Care team care coordination services as a part of their [employer] Medicaid benefit. Robin [name] verbally consented to engagement with the Medicaid Managed Care team.   For questions related to your [employer] Medicaid health plan, please call: [phone] or visit the homepage here: [email or web address]  If you would like to schedule transportation through your [employer] Medicaid plan, please call the following number at least 2 days in advance of your appointment: [phone]    Following is a copy of your plan of care:  Patient Care Plan: Social Work - Depression (Adult)    Problem Identified: Depression Identification (Depression)     Goal: Psychiatry and Outpatient Therapy for Depressive Symptoms Completed 10/15/2020  Start Date: 08/28/2020  Expected End Date: 11/04/2020  This Visit's Progress: On track  Recent Progress: On track  Priority: High  Note:    Current Barriers:  . 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 . Transportation - Due to husband's treatment, he is not always available to transport patient to her appointments.  . Update 08/28/2020: Transportation issues still create barriers for patient. She is aware that she can contact her Medicaid plan, Healthy [employer], for transportation assistance. . Update 09/25/2020: Patient was unable to keep previously scheduled appointment on 08/28/2020 due to conflict with husband's appointments. . Mental Health Concerns  . Patient's husband has cancer and is [facility]  [place]. . Suicidal Ideation/Homicidal Ideation: No  Clinical Social Work Goal(s):  . Over the next 60 days, patient will work with SW bi-weekly by telephone or in person to reduce or manage symptoms related to depression. . Over the next 30 days, patient will attend all scheduled mental health appointments: as scheduled. Patient will arrange transportation needs in the appropriate time frame to ensure she has transportation for scheduled appointments.  . Over the next 30 days, patient will demonstrate improved health management independence as evidenced by symptom management.   Interventions: . Patient interviewed and appropriate assessments performed: PHQ 2 and PHQ 9 . Patient interviewed and appropriate assessments performed . Provided patient with information about depression and physical illness. . Advised patient to discuss outpatient therapy with psychiatrist. Patient is scheduled for psychiatry at [facility] on 08/15/2020. LCSW encouraged patient to discuss therapy needs with the psychiatrist during this appointment so that she can receive both psychiatry and outpatient therapy at the same agency.  . Update 08/28/2020: Patient stated she had to cancel the appointment due to transportation issues and reschedule for today, 08/28/2020 @ 2:30pm. She stated she would like to discuss therapy needs at that time.  . Update 09/25/2020: Patient was unable to keep previously scheduled appointment on 08/28/2020 due to conflict with husband's appointments. Appointment was rescheduled to Monday, 09/30/2020. . Emotional/Supportive Counseling . Update 09/25/2020: LCSW will follow up on October 15, 2020 @ 2:00pm. . Update 10/15/2020: Patient confirmed psychiatric appointment attendance at [facility]. She stated the psychiatrist is scheduling her with a therapist at the same agency.  Patient Self Care Activities:  . Self administers medications as prescribed . Attends church or other social activities . Ability  for insight . Motivation for treatment . Strong   family or social support  Patient Coping Strengths:  . Supportive Relationships . Family . Friends . Church . [employer] . Spirituality . Hopefulness  Patient Self Care Deficits:  . Unable to independently get around without assistance due to arthritis all over her body. . Unable to perform ADLs independently . Unable to perform IADLs independently . Husband has cancer. . Transportation issues due to husband receiving treatment for cancer.  Goals Addressed            This Visit's Progress   . COMPLETED: Psychiatry and therapy treatment for depression   On track    Patient will:   . Attend appointments as scheduled . Have ability for insight . Continue motivation for treatment . Strong family or social support . Report changes in mood and/or symptoms to psychiatrist and therapist           Patient Care Plan: Weight loss to Improve Health    Problem Identified: Weight Management     Long-Range Goal: Achieving Weight Loss   Start Date: 08/22/2020  Expected End Date: 11/12/2020  This Visit's Progress: On track  Recent Progress: On track  Priority: High  Note:    Current Barriers:  [place] Kitchen Knowledge Deficits related to Weight loss. Robin [name] reports that she needs to work on weight loss. She is needing knee surgery, but needs to lose weight before having the procedure. . Unable to independently develop a plan for weight loss   Nurse Case Manager Clinical Goal(s):  [place] Kitchen Over the next 30 days, patient will verbalize understanding of plan for weight management-Met-[name] reports eating most meals at home, prepared by her husband. They have cut out all potatoes, rice and pasta, eating whole wheat bread and increased vegetables. She has been unable to exercise due to knee pain and is interested in Aquatic Therapy. . Over the next 60 days, patient will meet with RN Care Manager to address ways to achieve weight  loss . Over the next 30 days, patient will work with CM team pharmacist to review medications-Met  Interventions:  . Inter-disciplinary care team collaboration (see longitudinal plan of care) . Provided education to patient re: diet modification, diabetes and exercise  . Discussed plans with patient for ongoing care management follow up and provided patient with direct contact information for care management team . Pharmacy referral for medication review . RNCM explained to patient the importance of managing diabetes and how it can help with weight loss . Collaborate with PCP for Aquatic Therapy referral . Reviewed medications discussed that she has stopped taking gabapentin and she feels some improvement. . Discussed exercises that she can do while sitting . Reviewed upcoming appointments   Patient Goals/Self-Care Activities Over the next 30 days, patient will:  -Self administers medications as prescribed Attends all scheduled provider appointments Calls provider office for new concerns or questions - change to whole grain breads, cereal, pasta - drink 6 to 8 glasses of water each day - eat 3 to 5 servings of fruits and vegetables each day - eat 5 or 6 small meals each day - limit fast food meals to no more than 1 per week - manage portion size - Eat three small meals a day instead of 2 larger meals - Cut out regular soda - keep a food diary - reduce red meat to 2 to 3 times a week  Follow Up Plan: The Managed Medicaid care management team will reach out to the patient again over the next 45  days on 11/12/20 @ 10:30am       Patient verbalizes understanding of instructions provided today.   RN Care Manager will follow up with patient in 30 days. The patient has been provided with contact information for the Managed Medicaid care management team and has been advised to call with any health related questions or concerns.   Netta Neat, LCSW Netta Neat, BSW, MSW,  LCSW Social Work Case Freight forwarder - [place]  Direct [place]: [phone]

## 2020-10-15 NOTE — Patient Outreach (Signed)
Medicaid Managed Care Social Work Note  10/15/2020 Name:  Robin [name] MRN:  [id number] DOB:  05/25/1956  Robin [name] is an 64 y.o. year old female who is a primary patient of Johnson, Deborah B, MD.  The Medicaid Managed Care Coordination team was consulted for assistance with:  Mental Health Counseling and Resources  Robin [name] was given information about Medicaid Managed CareCoordination services today. Robin [name] agreed to services and verbal consent obtained.  Engaged with patient  for by telephone for follow up visit in response to referral for case management and/or care coordination services.   Assessments/Interventions:  Review of past medical history, allergies, medications, health status, including review of consultants reports, laboratory and other test data, was performed as part of comprehensive evaluation and provision of chronic care management services.  SDOH: (Social Determinant of Health) assessments and interventions performed: SDOH Interventions   Flowsheet Row Most Recent Value  SDOH Interventions   Food Insecurity Interventions Intervention Not Indicated  Financial Strain Interventions Intervention Not Indicated  Housing Interventions Intervention Not Indicated  Intimate Partner Violence Interventions Intervention Not Indicated  Physical Activity Interventions Other (Comments)  Stress Interventions Intervention Not Indicated  Transportation Interventions Other (Comment)  [Patient's husband transports her to appointments.]  Alcohol Brief Interventions/Follow-up AUDIT Score <7 follow-up not indicated  Depression Interventions/Treatment  --  [Patient sees Dr. B/psychiatrist at [facility], who is also scheduling her with therapist there.]      Advanced Directives Status:  Not addressed in this encounter.  Care Plan                 Allergies  Allergen Reactions  . Celexa [Citalopram Hydrobromide] Other (See Comments)    "Made me feel out of it"  . Gabapentin  Other (See Comments)    Contributed to lower extremity edema  . Norvasc [Amlodipine Besylate] Other (See Comments)    Edema in lower extremities  . Diflucan [Fluconazole] Rash    Medications Reviewed Today    Reviewed by , , LCSW (Social Worker) on 10/15/20 at 1417  Med List Status: <None>  Medication Order Taking? Sig Documenting Provider Last Dose Status Informant  ACCU-CHEK AVIVA PLUS test strip [id number]  USE AS DIRECTED Johnson, Deborah B, MD  Active Multiple Informants  ACCU-CHEK AVIVA PLUS test strip [id number]  USE AS INSTRUCTED  Patient not taking: Reported on 09/10/2020   Johnson, Deborah B, MD  Active Multiple Informants  acetaminophen (TYLENOL) 500 MG tablet [id number]  Take 1,000 mg by mouth 2 (two) times daily.  [provider]  Active Multiple Informants  albuterol (VENTOLIN HFA) 108 (90 Base) MCG/ACT inhaler [id number]  Inhale 1-2 puffs into the lungs every 6 (six) hours as needed for wheezing or shortness of breath. Bast, Traci A, NP  Active Multiple Informants  amitriptyline (ELAVIL) 50 MG tablet [id number]  TAKE 1 TABLET BY MOUTH AT BEDTIME. Johnson, Deborah B, MD  Active   aspirin EC 81 MG tablet [id number]  Take 81 mg by mouth at bedtime. [provider]  Active Multiple Informants  atorvastatin (LIPITOR) 40 MG tablet [id number]  Take 40 mg by mouth daily. [provider]  Active Multiple Informants  azelastine (ASTELIN) 0.1 % nasal spray [id number]  1-2 sprays per nostril 2 times daily as needed  Patient taking differently: Place 1 spray into both nostrils 2 (two) times daily.   Bobbitt, Ralph Carter, MD  Active Multiple Informants  buPROPion (WELLBUTRIN XL) 300 MG 24 hr tablet [id number] Yes TAKE 1 TABLET (  300 MG TOTAL) BY MOUTH EVERY MORNING.  Patient taking differently: Take 300 mg by mouth daily.   Johnson, Deborah B, MD Taking Active   CALCIUM CARBONATE ANTACID PO [id number]  Take 2 tablets by mouth See admin instructions. Take 2  tablets by mouth every morning, may also take 2 tablets at night as needed for acid reflux [provider]  Active Multiple Informants           Med Note (KENNEDY, NATHAN K   Tue Sep 10, 2020  2:30 PM) Tapering off  celecoxib (CELEBREX) 100 MG capsule [id number]  Take 1 capsule (100 mg total) by mouth 2 (two) times daily. Johnson, Deborah B, MD  Active Multiple Informants  CINNAMON PO [id number]  Take 1 capsule by mouth daily. [provider]  Active Multiple Informants  conjugated estrogens (PREMARIN) vaginal cream [id number]  Place 1 Applicatorful vaginally daily as needed (vaginal dryness/itching). [provider]  Active Multiple Informants  diclofenac sodium (VOLTAREN) 1 % GEL [id number]  Apply 2-4 g topically 4 (four) times daily as needed (pain). [provider]  Active Multiple Informants           Med Note (ROBB, MELANIE A   Tue Sep 24, 2020 11:12 AM)    DULoxetine (CYMBALTA) 30 MG capsule [id number] Yes TAKE 1 CAPSULE (30 MG TOTAL) BY MOUTH DAILY. Johnson, Deborah B, MD Taking Active   esomeprazole (NEXIUM) 40 MG capsule [id number]  Take 1 capsule (40 mg total) by mouth 2 (two) times daily before a meal. Lewis, Leslie S, PA-C  Active Multiple Informants  famotidine (PEPCID) 20 MG tablet [id number]  Take 1 tablet (20 mg total) by mouth 2 (two) times daily as needed for heartburn or indigestion.  Patient taking differently: Take 20 mg by mouth 2 (two) times daily.    Lewis, Leslie S, PA-C  Active Multiple Informants  fluticasone (FLONASE) 50 MCG/ACT nasal spray [id number]  Place 1 spray into both nostrils daily.  Patient taking differently: Place 1 spray into both nostrils daily as needed for allergies or rhinitis.   Bast, Traci A, NP  Active Multiple Informants  fluticasone (FLOVENT HFA) 220 MCG/ACT inhaler [id number]  2 inhalations twice daily  Patient taking differently: Inhale 2 puffs into the lungs 2 (two) times daily as needed (shortness of breath/wheezing).    Bobbitt, Ralph Carter, MD  Active Multiple Informants           Med Note (ATKINS, HEATHER L   Sat Jul 27, 2020  4:36 PM) Pt states that she only took once - when she could not find her albuterol  furosemide (LASIX) 40 MG tablet [id number]  TAKE 1/2 TO 1 TABLET BY MOUTH DAILY FOR LOWER EXTREMITY SWELLING  Patient taking differently: Take 20 mg by mouth daily.   Johnson, Deborah B, MD  Active Multiple Informants  gabapentin (NEURONTIN) 300 MG capsule [id number]  Take 2 capsules (600 mg total) by mouth 2 (two) times daily.  Patient not taking: No sig reported   Wert, Michael B, MD  Active            Med Note (KENNEDY, NATHAN K   Tue Sep 10, 2020  2:32 PM) Told to stop due to interaction with Lyrica  glucose blood test strip [id number]  USE AS INSTRUCTED [provider]  Active Multiple Informants  hydrALAZINE (APRESOLINE) 50 MG tablet [id number]  Take 1 tablet (50 mg total) by mouth 3 (three) times daily.  Patient taking differently: Take 50 mg by mouth   2 (two) times daily.   Kilroy, Luke K, PA-C  Active Multiple Informants           Med Note (KENNEDY, NATHAN K   Tue Sep 10, 2020  2:23 PM) Confirmed she's taking BID  hydrochlorothiazide (HYDRODIURIL) 25 MG tablet [id number]  Take 25 mg by mouth daily. [provider]  Active Multiple Informants           Med Note (KENNEDY, NATHAN K   Tue Sep 10, 2020  2:24 PM)    levothyroxine (SYNTHROID) 88 MCG tablet [id number]  Take 1 tablet (88 mcg total) by mouth daily. Johnson, Deborah B, MD  Active   LINZESS 290 MCG CAPS capsule [id number]  TAKE 1 CAPSULE BY MOUTH DAILY BEFORE BREAKFAST.  Patient taking differently: Take 290 mcg by mouth daily.   Gill, Eric A, NP  Active Multiple Informants  losartan (COZAAR) 25 MG tablet [id number]  Take 25 mg by mouth daily. [provider]  Active Multiple Informants  metFORMIN (GLUCOPHAGE) 1000 MG tablet [id number]  Take 1,000 mg by mouth 2 (two) times daily. [provider]  Active  Multiple Informants  Metoprolol Tartrate 75 MG TABS [id number]  TAKE 1 TABLET BY MOUTH TWICE DAILY.  Patient taking differently: Take 75 mg by mouth 2 (two) times daily.   Johnson, Deborah B, MD  Active Multiple Informants  montelukast (SINGULAIR) 10 MG tablet [id number]  TAKE 1 TABLET BY MOUTH AT BEDTIME. Johnson, Deborah B, MD  Active Multiple Informants           Med Note (KENNEDY, NATHAN K   Tue Sep 10, 2020  2:19 PM)    Multiple Vitamin (MULTIVITAMIN WITH MINERALS) TABS tablet [id number]  Take 1 tablet by mouth daily.  [provider]  Active Multiple Informants  Omega-3 Fatty Acids (FISH OIL) 1000 MG CAPS [id number]  Take 1,000 mg by mouth at bedtime.  [provider]  Active Multiple Informants  ondansetron (ZOFRAN) 4 MG tablet [id number]  Take 4 mg by mouth every 8 (eight) hours as needed for nausea or vomiting.  [provider]  Active Multiple Informants           Med Note (KENNEDY, NATHAN K   Tue Sep 10, 2020  2:22 PM) PRN  OVER THE COUNTER MEDICATION [id number]  Take 1 capsule by mouth daily. BEET ROOT [provider]  Active Multiple Informants  pregabalin (LYRICA) 25 MG capsule [id number]  Take 25 mg by mouth 2 (two) times daily.  [provider]  Active Multiple Informants           Med Note (KENNEDY, NATHAN K   Tue Sep 10, 2020  2:27 PM) Patient is taking Pregabalin, she didn't realize this was Lyrica  spironolactone (ALDACTONE) 25 MG tablet [id number]  Take 25 mg by mouth daily. [provider]  Active   tiZANidine (ZANAFLEX) 4 MG tablet [id number]  Take 4 mg by mouth 2 (two) times daily as needed for muscle spasms. [provider]  Active Multiple Informants  TRULICITY 0.75 MG/0.5ML SOPN [id number]  Inject 0.75 mg into the skin once a week.  [provider]  Active Multiple Informants           Med Note (ATKINS, HEATHER L   Sat Jul 27, 2020  4:34 PM) New med 07/24/2020 - not yet taken  valACYclovir (VALTREX) 500 MG  tablet [id number]  Take 500 mg by mouth 2 (two) times daily.  Patient not taking: No sig reported     [provider]  Active Multiple Informants           Med Note Payton Doughty   IFO Jul 27, 2020  4:46 PM) 7 day course filled 03/17/2020 and 04/09/2020  vitamin B-12 (CYANOCOBALAMIN) 1000 MCG tablet [id number]  Take 1,000 mcg by mouth daily. [provider]  Active Multiple Informants  Vitamin D, Ergocalciferol, (DRISDOL) 1.25 MG (50000 UNIT) CAPS capsule [id number]  Take 50,000 Units by mouth once a week. [provider]  Active Multiple Informants           Med Note Payton Doughty   SJG Jul 27, 2020  4:35 PM) New med 07/24/2020 - not yet taken          Patient Active Problem List   Diagnosis Date Noted  . Adjustment disorder with mixed disturbance of emotions and conduct 07/28/2020  . Polyp of descending colon 02/15/2020  . Multilevel degenerative disc disease 10/31/2018  . Abdominal pain 10/28/2018  . Change in bowel function 10/28/2018  . Gastritis and gastroduodenitis 04/26/2018  . Atrophic vaginitis 04/01/2018  . Diabetes mellitus ([place]) 03/31/2018  . Cough, persistent 02/08/2018  . Dysphagia 12/14/2017  . 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 Urinary incontinence 07/23/2017  . Early satiety 06/16/2017  . Hx of iron deficiency anemia 05/28/2017  . Throat pain in adult 04/05/2017  . Prediabetes 02/04/2017  . Dyspnea on exertion 12/23/2016  . 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 Hemoptysis 12/23/2016  . Fibromyalgia 08/18/2016  . Chronic lymphocytic thyroiditis 08/18/2016  . Depression 04/01/2016  . OSA (obstructive sleep apnea) 04/01/2016  . Essential hypertension 12/09/2015  . Hypothyroidism 12/09/2015  . Morbid obesity due to excess calories ([place]) 12/09/2015  . Constipation 05/27/2015  . Fatty liver 05/27/2015  . Abdominal pain, chronic, epigastric 07/04/2012  . Anxiety 06/09/2012  .  History of gastroesophageal reflux (GERD) 06/09/2012  . Hyperlipidemia 06/09/2012  . Refusal of blood transfusions as patient is Jehovah's Witness 06/09/2012  . 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 Abnormal small bowel biopsy 09/18/2011  . Lactose intolerance 09/18/2011  . GERD 01/21/2010    Conditions to be addressed/monitored per PCP order:  Depression  Patient Care Plan: Social Work - Depression (Adult)    Problem Identified: Depression Identification (Depression)     Goal: Psychiatry and Outpatient Therapy for Depressive Symptoms Completed 10/15/2020  Start Date: 08/28/2020  Expected End Date: 11/04/2020  This Visit's Progress: On track  Recent Progress: On track  Priority: High  Note:    Current Barriers:  . 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 . Transportation - Due to husband's treatment, he is not always available to transport patient to her appointments.  Marland Kitchen Update 08/28/2020: Transportation issues still create barriers for patient. She is aware that she can contact her Medicaid plan, [employer], for transportation assistance. Marland Kitchen Update 09/25/2020: Patient was unable to keep previously scheduled appointment on 28/36/6294 due to conflict with husband's appointments. . Mental Health Concerns  . Patient's husband has cancer and is currently receiving treatment at the [facility] in [place]. . Suicidal Ideation/Homicidal Ideation: No  Clinical Social Work Goal(s):  Marland Kitchen Over the next 60 days, patient will work with SW bi-weekly by telephone or in person to reduce or manage symptoms related to depression. . Over the next 30 days, patient will attend all scheduled mental health appointments: as scheduled. Patient will arrange transportation needs in the appropriate time frame to ensure she has  transportation for scheduled appointments.  . Over the next 30 days, patient will demonstrate improved health management  independence as evidenced by symptom management.   Interventions: . Patient interviewed and appropriate assessments performed: PHQ 2 and PHQ 9 . Patient interviewed and appropriate assessments performed . Provided patient with information about depression and physical illness. . Advised patient to discuss outpatient therapy with psychiatrist. Patient is scheduled for psychiatry at [facility] on 08/15/2020. LCSW encouraged patient to discuss therapy needs with the psychiatrist during this appointment so that she can receive both psychiatry and outpatient therapy at the same agency.  . Update 08/28/2020: Patient stated she had to cancel the appointment due to transportation issues and reschedule for today, 08/28/2020 @ 2:30pm. She stated she would like to discuss therapy needs at that time.  . Update 09/25/2020: Patient was unable to keep previously scheduled appointment on 08/28/2020 due to conflict with husband's appointments. Appointment was rescheduled to Monday, 09/30/2020. . Emotional/Supportive Counseling . Update 09/25/2020: LCSW will follow up on October 15, 2020 @ 2:00pm. . Update 10/15/2020: Patient confirmed psychiatric appointment attendance at [facility]. She stated the psychiatrist is scheduling her with a therapist at the same agency.  Patient Self Care Activities:  . Self administers medications as prescribed . Attends church or other social activities . Ability for insight . Motivation for treatment . Strong family or social support  Patient Coping Strengths:  . Supportive Relationships . Family . Friends . Church . Community Organizations . Spirituality . Hopefulness  Patient Self Care Deficits:  . Unable to independently get around without assistance due to arthritis all over her body. . Unable to perform ADLs independently . Unable to perform IADLs independently . Husband has cancer. . Transportation issues due to husband receiving treatment for cancer.  Goals Addressed             This Visit's Progress   . COMPLETED: Psychiatry and therapy treatment for depression   On track    Patient will:   . Attend appointments as scheduled . Have ability for insight . Continue motivation for treatment . Strong family or social support . Report changes in mood and/or symptoms to psychiatrist and therapist            Patient Care Plan: Weight loss to Improve Health    Problem Identified: Weight Management     Long-Range Goal: Achieving Weight Loss   Start Date: 08/22/2020  Expected End Date: 11/12/2020  This Visit's Progress: On track  Recent Progress: On track  Priority: High  Note:    Current Barriers:  . Knowledge Deficits related to Weight loss. Ms. Brechtel reports that she needs to work on weight loss. She is needing knee surgery, but needs to lose weight before having the procedure. . Unable to independently develop a plan for weight loss   Nurse Case Manager Clinical Goal(s):  . Over the next 30 days, patient will verbalize understanding of plan for weight management-Met-Ms Menge reports eating most meals at home, prepared by her husband. They have cut out all potatoes, rice and pasta, eating whole wheat bread and increased vegetables. She has been unable to exercise due to knee pain and is interested in Aquatic Therapy. . Over the next 60 days, patient will meet with RN Care Manager to address ways to achieve weight loss . Over the next 30 days, patient will work with CM team pharmacist to review medications-Met  Interventions:  . Inter-disciplinary care team collaboration (see longitudinal plan of care) . Provided   education to patient re: diet modification, diabetes and exercise  . Discussed plans with patient for ongoing care management follow up and provided patient with direct contact information for care management team . Pharmacy referral for medication review . RNCM explained to patient the importance of managing diabetes and how it can help  with weight loss . Collaborate with PCP for Aquatic Therapy referral . Reviewed medications discussed that she has stopped taking gabapentin and she feels some improvement. . Discussed exercises that she can do while sitting . Reviewed upcoming appointments   Patient Goals/Self-Care Activities Over the next 30 days, patient will:  -Self administers medications as prescribed Attends all scheduled provider appointments Calls provider office for new concerns or questions - change to whole grain breads, cereal, pasta - drink 6 to 8 glasses of water each day - eat 3 to 5 servings of fruits and vegetables each day - eat 5 or 6 small meals each day - limit fast food meals to no more than 1 per week - manage portion size - Eat three small meals a day instead of 2 larger meals - Cut out regular soda - keep a food diary - reduce red meat to 2 to 3 times a week  Follow Up Plan: The Managed Medicaid care management team will reach out to the patient again over the next 45 days on 11/12/20 @ 10:30am       Follow up:  Patient agrees to Care Plan and Follow-up.  Plan: The Managed Medicaid care management team will reach out to the patient again over the next 30 days. and The patient has been provided with contact information for the Managed Medicaid care management team and has been advised to call with any health related questions or concerns.  Date/time of next scheduled Social Work care management/care coordination outreach:  Patient has completed Social Work goals; no follow up is required at this time. The patient will continue to be followed by the [facility] Managed Care Team [facility] and Pharmacist.   RNCM scheduled appointment is 11/12/2020 @ 10:30am.   Netta Neat, BSW, MSW, [place]: [phone]

## 2020-10-17 ENCOUNTER — Ambulatory Visit: Payer: Medicaid Other | Admitting: Internal Medicine

## 2020-10-23 ENCOUNTER — Ambulatory Visit: Payer: Medicaid Other | Admitting: Physical Therapy

## 2020-10-24 ENCOUNTER — Ambulatory Visit: Payer: Medicaid Other | Admitting: Physical Therapy

## 2020-11-04 ENCOUNTER — Encounter: Payer: Self-pay | Admitting: Podiatry

## 2020-11-04 ENCOUNTER — Other Ambulatory Visit: Payer: Self-pay

## 2020-11-04 ENCOUNTER — Ambulatory Visit (INDEPENDENT_AMBULATORY_CARE_PROVIDER_SITE_OTHER): Payer: Medicaid Other | Admitting: Podiatry

## 2020-11-04 DIAGNOSIS — M79675 Pain in left toe(s): Secondary | ICD-10-CM

## 2020-11-04 DIAGNOSIS — B351 Tinea unguium: Secondary | ICD-10-CM

## 2020-11-04 DIAGNOSIS — E1142 Type 2 diabetes mellitus with diabetic polyneuropathy: Secondary | ICD-10-CM | POA: Diagnosis not present

## 2020-11-04 DIAGNOSIS — M79674 Pain in right toe(s): Secondary | ICD-10-CM

## 2020-11-04 NOTE — Progress Notes (Signed)
Subjective:  Patient ID: Robin [name], female    DOB: 1956-03-28,  MRN: [id number]  Robin [name] presents to clinic today for preventative diabetic foot care and painful thick toenails that are difficult to trim. Pain interferes with ambulation. Aggravating factors include wearing enclosed shoe gear. Pain is relieved with periodic professional debridement.  65 y.o. female presents with the above complaint.  Reports painfully elongated nails to both feet.  She voices no new pedal complaints on today's visit.  Review of Systems: Negative except as noted in the HPI. Past Medical History:  Diagnosis Date  . Anxiety disorder   . Arthritis   . Asthma   . Chronic neck pain   . Constipation   . Depression   . Diabetes mellitus without complication ([place])   . Dyspnea   . GERD (gastroesophageal reflux disease)   . Hiatal hernia    small  . Hyperlipidemia   . Hypertension   . Hypothyroidism   . Morbid obesity with BMI of 50.0-59.9, adult ([place])   . Schatzki's ring    non critical  . Sinusitis   . Sleep apnea    CPAP, Sleep study at [place]  . Sleep apnea    Past Surgical History:  Procedure Laterality Date  . ABDOMINAL HYSTERECTOMY    . ABDOMINAL HYSTERECTOMY  02/18/2000  . CARDIAC CATHETERIZATION  08/18/2003   normal L main/LAD/L Cfx/RCA (Dr. Adora Fridge)  . COLONOSCOPY  2006   Dr. [name], hyperplastic polyps  . COLONOSCOPY  08/06/11   abnormal terminal ileum for 10cm, erosions, geographical ulceration. Bx small bowel mucosa with prominent intramucosal lymphoid aggregates, slightly inflammed  . COLONOSCOPY WITH PROPOFOL N/A 12/28/2019   Rourk: 4 sessile serrated adenomas removed on the colon.  Next colonoscopy in 3 years.  Marland Kitchen DIRECT LARYNGOSCOPY  03/15/2012   Procedure: DIRECT LARYNGOSCOPY;  Surgeon: [name], MD;  Location: [facility];  Service: ENT;  Laterality: N/A; Dr. Wolicki--:>no foreign body seen. normal esophagus to 40cm  . ESOPHAGEAL DILATION  04/17/2015    Procedure: ESOPHAGEAL DILATION;  Surgeon: [name], MD;  [facility];  Service: Endoscopy;;  . ESOPHAGOGASTRODUODENOSCOPY  08/23/2008   [facility]:DQQI distal esophageal erosions consistent with mild erosive reflux esophagitis, otherwise unremarkable esophagus/ Tiny antral erosions of doubtful clinical significance, otherwise normal stomach, patent pylorus, normal D1 and D2  . ESOPHAGOGASTRODUODENOSCOPY  08/06/11   small hh, noncritical Schatzki's ring s/p 79 F  . ESOPHAGOGASTRODUODENOSCOPY N/A 04/17/2015   [facility]:[id number] s/p dilation  . ESOPHAGOGASTRODUODENOSCOPY (EGD) WITH PROPOFOL N/A 01/20/2018   Dr. [name]: Erosive gastropathy, normal-appearing esophagus status post empiric dilation.  Chronic gastritis, no H. pylori.  . ESOPHAGOSCOPY  03/15/2012   Procedure: ESOPHAGOSCOPY;  Surgeon: [name], MD;  Location: [facility];  Service: ENT;  Laterality: N/A;  . KNEE ARTHROSCOPY  04/27/2001  . MALONEY DILATION N/A 01/20/2018   Procedure: Venia Minks DILATION;  Surgeon: [name], MD;  [facility];  Service: Endoscopy;  Laterality: N/A;  . [place]  2003   negative bruce protocol exercise tress test; EF 68%; intermediate risk study due to evidence of anterior wall ischemia extending from mid-ventricle to apex  . PITUITARY SURGERY  06/2012   benign tumor, surgeon at [facility]  . POLYPECTOMY  12/28/2019   Procedure: POLYPECTOMY;  Surgeon: [name], MD;  [facility];  Service: Endoscopy;;  . RECTOCELE REPAIR    . TRANSTHORACIC ECHOCARDIOGRAM  2003   EF  normal  . VAGINAL PROLAPSE REPAIR    . VIDEO BRONCHOSCOPY Bilateral 03/08/2017   Procedure: VIDEO BRONCHOSCOPY WITHOUT FLUORO;  Surgeon: Roslynn Amble, MD;  Location: [facility];  Service: Cardiopulmonary;  Laterality: Bilateral;    Current Outpatient Medications:  .  ACCU-CHEK AVIVA PLUS test strip, USE AS DIRECTED, Disp: 100 each, Rfl: 12 .  ACCU-CHEK AVIVA PLUS test strip, USE AS  INSTRUCTED (Patient not taking: Reported on 09/10/2020), Disp: 100 strip, Rfl: 12 .  acetaminophen (TYLENOL) 500 MG tablet, Take 1,000 mg by mouth 2 (two) times daily. , Disp: , Rfl:  .  albuterol (VENTOLIN HFA) 108 (90 Base) MCG/ACT inhaler, Inhale 1-2 puffs into the lungs every 6 (six) hours as needed for wheezing or shortness of breath., Disp: 18 g, Rfl: 0 .  amitriptyline (ELAVIL) 50 MG tablet, TAKE 1 TABLET BY MOUTH AT BEDTIME., Disp: 90 tablet, Rfl: 0 .  aspirin EC 81 MG tablet, Take 81 mg by mouth at bedtime., Disp: , Rfl:  .  atorvastatin (LIPITOR) 40 MG tablet, Take 40 mg by mouth daily., Disp: , Rfl:  .  azelastine (ASTELIN) 0.1 % nasal spray, 1-2 sprays per nostril 2 times daily as needed (Patient taking differently: Place 1 spray into both nostrils 2 (two) times daily.), Disp: 30 mL, Rfl: 12 .  buPROPion (WELLBUTRIN XL) 300 MG 24 hr tablet, TAKE 1 TABLET (300 MG TOTAL) BY MOUTH EVERY MORNING. (Patient taking differently: Take 300 mg by mouth daily.), Disp: 90 tablet, Rfl: 2 .  CALCIUM CARBONATE ANTACID PO, Take 2 tablets by mouth See admin instructions. Take 2 tablets by mouth every morning, may also take 2 tablets at night as needed for acid reflux, Disp: , Rfl:  .  celecoxib (CELEBREX) 100 MG capsule, Take 1 capsule (100 mg total) by mouth 2 (two) times daily., Disp: 60 capsule, Rfl: 6 .  CINNAMON PO, Take 1 capsule by mouth daily., Disp: , Rfl:  .  conjugated estrogens (PREMARIN) vaginal cream, Place 1 Applicatorful vaginally daily as needed (vaginal dryness/itching)., Disp: , Rfl:  .  DEXILANT 60 MG capsule, Take 1 capsule by mouth daily., Disp: , Rfl:  .  diclofenac sodium (VOLTAREN) 1 % GEL, Apply 2-4 g topically 4 (four) times daily as needed (pain)., Disp: , Rfl:  .  Diclofenac Sodium 3 % GEL, SMARTSIG:1-2 Gram(s) Topical 3-4 Times Daily, Disp: , Rfl:  .  DULoxetine (CYMBALTA) 30 MG capsule, TAKE 1 CAPSULE (30 MG TOTAL) BY MOUTH DAILY., Disp: 90 capsule, Rfl: 0 .  esomeprazole  (NEXIUM) 40 MG capsule, Take 1 capsule (40 mg total) by mouth 2 (two) times daily before a meal., Disp: 60 capsule, Rfl: 5 .  famotidine (PEPCID) 20 MG tablet, Take 1 tablet (20 mg total) by mouth 2 (two) times daily as needed for heartburn or indigestion. (Patient taking differently: Take 20 mg by mouth 2 (two) times daily. ), Disp: 60 tablet, Rfl: 3 .  fluticasone (FLONASE) 50 MCG/ACT nasal spray, Place 1 spray into both nostrils daily. (Patient taking differently: Place 1 spray into both nostrils daily as needed for allergies or rhinitis.), Disp: 16 g, Rfl: 1 .  fluticasone (FLOVENT HFA) 220 MCG/ACT inhaler, 2 inhalations twice daily (Patient taking differently: Inhale 2 puffs into the lungs 2 (two) times daily as needed (shortness of breath/wheezing).), Disp: 1 each, Rfl: 12 .  furosemide (LASIX) 40 MG tablet, TAKE 1/2 TO 1 TABLET BY MOUTH DAILY FOR LOWER EXTREMITY SWELLING (Patient taking differently: Take 20 mg by mouth daily.),  Disp: 90 tablet, Rfl: 2 .  gabapentin (NEURONTIN) 300 MG capsule, Take 2 capsules (600 mg total) by mouth 2 (two) times daily. (Patient not taking: No sig reported), Disp: 60 capsule, Rfl: 0 .  glucose blood test strip, USE AS INSTRUCTED, Disp: , Rfl:  .  glucose blood test strip, USE AS INSTRUCTED, Disp: , Rfl:  .  hydrALAZINE (APRESOLINE) 50 MG tablet, Take 1 tablet (50 mg total) by mouth 3 (three) times daily. (Patient taking differently: Take 50 mg by mouth 2 (two) times daily.), Disp: 270 tablet, Rfl: 1 .  hydrochlorothiazide (HYDRODIURIL) 25 MG tablet, Take 25 mg by mouth daily., Disp: , Rfl:  .  levothyroxine (SYNTHROID) 88 MCG tablet, Take 1 tablet (88 mcg total) by mouth daily., Disp: 30 tablet, Rfl: 2 .  LINZESS 290 MCG CAPS capsule, TAKE 1 CAPSULE BY MOUTH DAILY BEFORE BREAKFAST. (Patient taking differently: Take 290 mcg by mouth daily.), Disp: 90 capsule, Rfl: 3 .  losartan (COZAAR) 25 MG tablet, Take 25 mg by mouth daily., Disp: , Rfl:  .  metFORMIN  (GLUCOPHAGE) 1000 MG tablet, Take 1,000 mg by mouth 2 (two) times daily., Disp: , Rfl:  .  metFORMIN (GLUCOPHAGE) 500 MG tablet, Take 500 mg by mouth 2 (two) times daily., Disp: , Rfl:  .  Metoprolol Tartrate 75 MG TABS, TAKE 1 TABLET BY MOUTH TWICE DAILY. (Patient taking differently: Take 75 mg by mouth 2 (two) times daily.), Disp: 180 tablet, Rfl: 2 .  montelukast (SINGULAIR) 10 MG tablet, TAKE 1 TABLET BY MOUTH AT BEDTIME., Disp: 90 tablet, Rfl: 2 .  Multiple Vitamin (MULTIVITAMIN WITH MINERALS) TABS tablet, Take 1 tablet by mouth daily. , Disp: , Rfl:  .  NARCAN 4 MG/0.1ML LIQD nasal spray kit, SMARTSIG:1 Spray(s) Both Nares Once PRN, Disp: , Rfl:  .  Omega-3 Fatty Acids (FISH OIL) 1000 MG CAPS, Take 1,000 mg by mouth at bedtime. , Disp: , Rfl:  .  ondansetron (ZOFRAN) 4 MG tablet, Take 4 mg by mouth every 8 (eight) hours as needed for nausea or vomiting. , Disp: , Rfl:  .  OVER THE COUNTER MEDICATION, Take 1 capsule by mouth daily. BEET ROOT, Disp: , Rfl:  .  pregabalin (LYRICA) 25 MG capsule, Take 25 mg by mouth 2 (two) times daily. , Disp: , Rfl:  .  spironolactone (ALDACTONE) 25 MG tablet, Take 25 mg by mouth daily., Disp: , Rfl:  .  tiZANidine (ZANAFLEX) 4 MG tablet, Take 4 mg by mouth 2 (two) times daily as needed for muscle spasms., Disp: , Rfl:  .  TRULICITY 0.75 MG/0.5ML SOPN, Inject 0.75 mg into the skin once a week. , Disp: , Rfl:  .  valACYclovir (VALTREX) 500 MG tablet, Take 500 mg by mouth 2 (two) times daily. (Patient not taking: No sig reported), Disp: , Rfl:  .  vitamin B-12 (CYANOCOBALAMIN) 1000 MCG tablet, Take 1,000 mcg by mouth daily., Disp: , Rfl:  .  Vitamin D, Ergocalciferol, (DRISDOL) 1.25 MG (50000 UNIT) CAPS capsule, Take 50,000 Units by mouth once a week., Disp: , Rfl:  Allergies  Allergen Reactions  . Celexa [Citalopram Hydrobromide] Other (See Comments)    "Made me feel out of it"  . Gabapentin Other (See Comments)    Contributed to lower extremity edema  .  Norvasc [Amlodipine Besylate] Other (See Comments)    Edema in lower extremities  . Diflucan [Fluconazole] Rash   Social History   Occupational History    Employer: UNEMPLOYED  Tobacco  Use  . Smoking status: Former Smoker    Packs/day: 0.50    Years: 10.00    Pack years: 5.00    Types: Cigarettes    Start date: 07/14/1975    Quit date: 04/04/1993    Years since quitting: 27.6  . Smokeless tobacco: Never Used  . Tobacco comment: quit 20 yrs ago  Vaping Use  . Vaping Use: Never used  Substance and Sexual Activity  . Alcohol use: No    Alcohol/week: 0.0 standard drinks  . Drug use: No  . Sexual activity: Yes    Objective:   Constitutional Robin [name] is a pleasant 65 y.o. African American female, morbidly obese in NAD. AAO x 3.   Vascular Dorsalis pedis pulses palpable bilaterally. Posterior tibial pulses palpable bilaterally.Capillary refill normal to all digits. No cyanosis or clubbing noted. Pedal hair growth diminished b/l. Lower extremity skin temperature gradient within normal limits. No pain with calf compression b/l. No edema noted b/l lower extremities.  Neurologic Normal speech. Oriented to person, place, and time. Pt has subjective symptoms of neuropathy. Protective sensation intact 5/5 intact bilaterally with 10g monofilament b/l. Vibratory sensation intact b/l. Proprioception intact bilaterally.  Dermatologic . Pedal skin with normal turgor, texture and tone bilaterally. No open wounds bilaterally. No interdigital macerations bilaterally. Toenails 1-5 b/l elongated, discolored, dystrophic, thickened, crumbly with subungual debris and tenderness to dorsal palpation.  Orthopedic: Normal muscle strength 5/5 to all lower extremity muscle groups bilaterally. No pain crepitus or joint limitation noted with ROM b/l. No gross bony deformities bilaterally. Utilizes motorized chair for mobility assistance.   Radiographs: None Assessment:   1. Pain due to onychomycosis of  toenails of both feet   2. Diabetic peripheral neuropathy associated with type 2 diabetes mellitus ([place])    Plan:  Patient was evaluated and treated and all questions answered.  Onychomycosis with pain -Nails palliatively debridement as below -Educated on self-care  Procedure: Nail Debridement Rationale: Pain Type of Debridement: manual, sharp debridement. Instrumentation: Nail nipper, rotary burr. Number of Nails: 10 -Examined patient. -No new findings. No new orders. -Continue diabetic foot care principles. -Toenails 1-5 b/l were debrided in length and girth with sterile nail nippers and dremel without iatrogenic bleeding.  -Patient to report any pedal injuries to medical professional immediately. -Patient to continue soft, supportive shoe gear daily. -Patient/POA to call should there be question/concern in the interim.  Return in about 3 months (around 02/01/2021).  Marzetta Board, DPM

## 2020-11-08 ENCOUNTER — Telehealth: Payer: Self-pay | Admitting: Internal Medicine

## 2020-11-08 NOTE — Telephone Encounter (Signed)
Copied from [place] [id number]. Topic: General - Inquiry >> Nov 08, 2020  8:32 AM Gillis Ends D wrote: Reason for CRM: Renee from home care [facility] called and was checking on the status of a certificate of Medical Necessity and a Physicians order also. She can be reached at [phone]. Please advise

## 2020-11-11 NOTE — Telephone Encounter (Signed)
Received paperwork for incontinence supplies. Paperwork will be placed in provider fax folder for admin

## 2020-11-12 ENCOUNTER — Other Ambulatory Visit: Payer: Self-pay

## 2020-11-12 ENCOUNTER — Other Ambulatory Visit: Payer: Self-pay | Admitting: *Deleted

## 2020-11-12 ENCOUNTER — Other Ambulatory Visit: Payer: Self-pay | Admitting: Nurse Practitioner

## 2020-11-12 MED FILL — ESOMEPRAZOLE MAG DR 40 MG C: 40 | 30 days supply | Qty: 60 | Fill #3

## 2020-11-12 MED FILL — CELECOXIB 100 MG CAP: 100 | 30 days supply | Qty: 60 | Fill #3

## 2020-11-12 MED FILL — LEVOTHYROXINE 88 MCG TABLET: 88 | 30 days supply | Qty: 30 | Fill #2

## 2020-11-12 NOTE — Patient Instructions (Addendum)
Visit Information  Robin [name] was given information about Medicaid Managed Care team care coordination services and verbally consented to engagement with the [facility] Managed Care team.    Robin [name] - following are the goals we discussed in your visit today:  [email or web address]   Please see education materials related to exercise and diet provided as print materials.     Exercises to do While Sitting  Exercises that you do while sitting (chair exercises) can give you many of the same benefits as full exercise. Benefits include strengthening your heart, burning calories, and keeping muscles and joints healthy. Exercise can also improve your mood and help with depression and anxiety. You may benefit from chair exercises if you are unable to do standing exercises because of:  Diabetic foot pain.  Obesity.  Illness.  Arthritis.  Recovery from surgery or injury.  Breathing problems.  Balance problems.  Another type of disability. Before starting chair exercises, check with your health care provider or a physical therapist to find out how much exercise you can tolerate and which exercises are safe for you. If your health care provider approves:  Start out slowly and build up over time. Aim to work up to about 10-20 minutes for each exercise session.  Make exercise part of your daily routine.  Drink water when you exercise. Do not wait until you are thirsty. Drink every 10-15 minutes.  Stop exercising right away if you have pain, nausea, shortness of breath, or dizziness.  If you are exercising in a wheelchair, make sure to lock the wheels.  Ask your health care provider whether you can do tai chi or yoga. Many positions in these mind-body exercises can be modified to do while seated. Warm-up Before starting other exercises: 1. Sit up as straight as you can. Have your knees bent at 90 degrees, which is the shape of the capital letter "L." Keep your feet flat on the floor. 2. Sit at  the front edge of your chair, if you can. 3. Pull in (tighten) the muscles in your abdomen and stretch your spine and neck as straight as you can. Hold this position for a few minutes. 4. Breathe in and out evenly. Try to concentrate on your breathing, and relax your mind. Stretching Exercise A: Arm stretch 1. Hold your arms out straight in front of your body. 2. Bend your hands at the wrist with your fingers pointing up, as if signaling someone to stop. Notice the slight tension in your forearms as you hold the position. 3. Keeping your arms out and your hands bent, rotate your hands outward as far as you can and hold this stretch. Aim to have your thumbs pointing up and your pinkie fingers pointing down. Slowly repeat arm stretches for one minute as tolerated. Exercise B: Leg stretch 1. If you can move your legs, try to "draw" letters on the floor with the toes of your foot. Write your name with one foot. 2. Write your name with the toes of your other foot. Slowly repeat the movements for one minute as tolerated. Exercise C: Reach for the sky 1. Reach your hands as far over your head as you can to stretch your spine. 2. Move your hands and arms as if you are climbing a rope. Slowly repeat the movements for one minute as tolerated. Range of motion exercises Exercise A: Shoulder roll 1. Let your arms hang loosely at your sides. 2. Lift just your shoulders up toward your ears, then let them relax  back down. 3. When your shoulders feel loose, rotate your shoulders in backward and forward circles. Do shoulder rolls slowly for one minute as tolerated. Exercise B: March in place 1. As if you are marching, pump your arms and lift your legs up and down. Lift your knees as high as you can. ? If you are unable to lift your knees, just pump your arms and move your ankles and feet up and down. March in place for one minute as tolerated. Exercise C: Seated jumping jacks 1. Let your arms hang down  straight. 2. Keeping your arms straight, lift them up over your head. Aim to point your fingers to the ceiling. 3. While you lift your arms, straighten your legs and slide your heels along the floor to your sides, as wide as you can. 4. As you bring your arms back down to your sides, slide your legs back together. ? If you are unable to use your legs, just move your arms. Slowly repeat seated jumping jacks for one minute as tolerated. Strengthening exercises Exercise A: Shoulder squeeze 1. Hold your arms straight out from your body to your sides, with your elbows bent and your fists pointed at the ceiling. 2. Keeping your arms in the bent position, move them forward so your elbows and forearms meet in front of your face. 3. Open your arms back out as wide as you can with your elbows still bent, until you feel your shoulder blades squeezing together. Hold for 5 seconds. Slowly repeat the movements forward and backward for one minute as tolerated. Contact a health care provider if you:  Had to stop exercising due to any of the following: ? Pain. ? Nausea. ? Shortness of breath. ? Dizziness. ? Fatigue.  Have significant pain or soreness after exercising. Get help right away if you have:  Chest pain.  Difficulty breathing. These symptoms may represent a serious problem that is an emergency. Do not wait to see if the symptoms will go away. Get medical help right away. Call your local emergency services (911 in the [place] Do not drive yourself to the hospital. This information is not intended to replace advice given to you by your health care provider. Make sure you discuss any questions you have with your health care provider. Document Revised: 01/18/2020 Document Reviewed: 01/18/2020 Elsevier Patient Education  2021 [place].  [email or web address]">    DASH Eating Plan DASH stands for Dietary Approaches to Stop Hypertension. The DASH  eating plan is a healthy eating plan that has been shown to:  Reduce high blood pressure (hypertension).  Reduce your risk for type 2 diabetes, heart disease, and stroke.  Help with weight loss. What are tips for following this plan? Reading food labels  Check food labels for the amount of salt (sodium) per serving. Choose foods with less than 5 percent of the Daily Value of sodium. Generally, foods with less than 300 milligrams (mg) of sodium per serving fit into this eating plan.  To find whole grains, look for the word "whole" as the first word in the ingredient list. Shopping  Buy products labeled as "low-sodium" or "no salt added."  Buy fresh foods. Avoid canned foods and pre-made or frozen meals. Cooking  Avoid adding salt when cooking. Use salt-free seasonings or herbs instead of table salt or sea salt. Check with your health care provider or pharmacist before using salt substitutes.  Do not fry foods. Cook foods using healthy methods such as baking, boiling, grilling, roasting,  and broiling instead.  Cook with heart-healthy oils, such as olive, canola, avocado, soybean, or sunflower oil. Meal planning  Eat a balanced diet that includes: ? 4 or more servings of fruits and 4 or more servings of vegetables each day. Try to fill one-half of your plate with fruits and vegetables. ? 6-8 servings of whole grains each day. ? Less than 6 oz (170 g) of lean meat, poultry, or fish each day. A 3-oz (85-g) serving of meat is about the same size as a deck of cards. One egg equals 1 oz (28 g). ? 2-3 servings of low-fat dairy each day. One serving is 1 cup (237 mL). ? 1 serving of nuts, seeds, or beans 5 times each week. ? 2-3 servings of heart-healthy fats. Healthy fats called omega-3 fatty acids are found in foods such as walnuts, flaxseeds, fortified milks, and eggs. These fats are also found in cold-water fish, such as sardines, salmon, and mackerel.  Limit how much you eat  of: ? Canned or prepackaged foods. ? Food that is high in trans fat, such as some fried foods. ? Food that is high in saturated fat, such as fatty meat. ? Desserts and other sweets, sugary drinks, and other foods with added sugar. ? Full-fat dairy products.  Do not salt foods before eating.  Do not eat more than 4 egg yolks a week.  Try to eat at least 2 vegetarian meals a week.  Eat more home-cooked food and less restaurant, buffet, and fast food.   Lifestyle  When eating at a restaurant, ask that your food be prepared with less salt or no salt, if possible.  If you drink alcohol: ? Limit how much you use to:  0-1 drink a day for women who are not pregnant.  0-2 drinks a day for men. ? Be aware of how much alcohol is in your drink. In the [place] one drink equals one 12 oz bottle of beer (355 mL), one 5 oz glass of wine (148 mL), or one 1 oz glass of hard liquor (44 mL). General information  Avoid eating more than 2,300 mg of salt a day. If you have hypertension, you may need to reduce your sodium intake to 1,500 mg a day.  Work with your health care provider to maintain a healthy body weight or to lose weight. Ask what an ideal weight is for you.  Get at least 30 minutes of exercise that causes your heart to beat faster (aerobic exercise) most days of the week. Activities may include walking, swimming, or biking.  Work with your health care provider or dietitian to adjust your eating plan to your individual calorie needs. What foods should I eat? Fruits All fresh, dried, or frozen fruit. Canned fruit in natural juice (without added sugar). Vegetables Fresh or frozen vegetables (raw, steamed, roasted, or grilled). Low-sodium or reduced-sodium tomato and vegetable juice. Low-sodium or reduced-sodium tomato sauce and tomato paste. Low-sodium or reduced-sodium canned vegetables. Grains Whole-grain or whole-wheat bread. Whole-grain or whole-wheat pasta. Brown rice. [place]. Bulgur. Whole-grain and low-sodium cereals. Pita bread. Low-fat, low-sodium crackers. Whole-wheat flour tortillas. Meats and other proteins Skinless chicken or [place]. Ground chicken or [place]. Pork with fat trimmed off. Fish and seafood. Egg whites. Dried beans, peas, or lentils. Unsalted nuts, nut butters, and seeds. Unsalted canned beans. Lean cuts of beef with fat trimmed off. Low-sodium, lean precooked or cured meat, such as sausages or meat loaves. Dairy Low-fat (1%) or fat-free (skim) milk. Reduced-fat,  low-fat, or fat-free cheeses. Nonfat, low-sodium ricotta or cottage cheese. Low-fat or nonfat yogurt. Low-fat, low-sodium cheese. Fats and oils Soft margarine without trans fats. Vegetable oil. Reduced-fat, low-fat, or light mayonnaise and salad dressings (reduced-sodium). Canola, safflower, olive, avocado, soybean, and sunflower oils. Avocado. Seasonings and condiments Herbs. Spices. Seasoning mixes without salt. Other foods Unsalted popcorn and pretzels. Fat-free sweets. The items listed above may not be a complete list of foods and beverages you can eat. Contact a dietitian for more information. What foods should I avoid? Fruits Canned fruit in a light or heavy syrup. Fried fruit. Fruit in cream or butter sauce. Vegetables Creamed or fried vegetables. Vegetables in a cheese sauce. Regular canned vegetables (not low-sodium or reduced-sodium). Regular canned tomato sauce and paste (not low-sodium or reduced-sodium). Regular tomato and vegetable juice (not low-sodium or reduced-sodium). Angie Fava. Olives. Grains Baked goods made with fat, such as croissants, muffins, or some breads. Dry pasta or rice meal packs. Meats and other proteins Fatty cuts of meat. Ribs. Fried meat. Berniece Salines. Bologna, salami, and other precooked or cured meats, such as sausages or meat loaves. Fat from the back of a pig (fatback). Bratwurst. Salted nuts and seeds. Canned beans with added salt. Canned or smoked  fish. Whole eggs or egg yolks. Chicken or [place] with skin. Dairy Whole or 2% milk, cream, and half-and-half. Whole or full-fat cream cheese. Whole-fat or sweetened yogurt. Full-fat cheese. Nondairy creamers. Whipped toppings. Processed cheese and cheese spreads. Fats and oils Butter. Stick margarine. Lard. Shortening. Ghee. Bacon fat. Tropical oils, such as coconut, palm kernel, or palm oil. Seasonings and condiments Onion salt, garlic salt, seasoned salt, table salt, and sea salt. Worcestershire sauce. Tartar sauce. Barbecue sauce. Teriyaki sauce. Soy sauce, including reduced-sodium. Steak sauce. Canned and packaged gravies. Fish sauce. Oyster sauce. Cocktail sauce. Store-bought horseradish. Ketchup. Mustard. Meat flavorings and tenderizers. Bouillon cubes. Hot sauces. Pre-made or packaged marinades. Pre-made or packaged taco seasonings. Relishes. Regular salad dressings. Other foods Salted popcorn and pretzels. The items listed above may not be a complete list of foods and beverages you should avoid. Contact a dietitian for more information. Where to find more information  National Heart, Lung, and Blood Institute: https://wilson-eaton.com/  American Heart Association: www.heart.org  Academy of Nutrition and Dietetics: www.eatright.[place]: www.kidney.org Summary  The DASH eating plan is a healthy eating plan that has been shown to reduce high blood pressure (hypertension). It may also reduce your risk for type 2 diabetes, heart disease, and stroke.  When on the DASH eating plan, aim to eat more fresh fruits and vegetables, whole grains, lean proteins, low-fat dairy, and heart-healthy fats.  With the DASH eating plan, you should limit salt (sodium) intake to 2,300 mg a day. If you have hypertension, you may need to reduce your sodium intake to 1,500 mg a day.  Work with your health care provider or dietitian to adjust your eating plan to your individual calorie  needs. This information is not intended to replace advice given to you by your health care provider. Make sure you discuss any questions you have with your health care provider. Document Revised: 08/25/2019 Document Reviewed: 08/25/2019 Elsevier Patient Education  2021 [place].   Diabetes Mellitus and Nutrition, Adult When you have diabetes, or diabetes mellitus, it is very important to have healthy eating habits because your blood sugar (glucose) levels are greatly affected by what you eat and drink. Eating healthy foods in the right amounts, at about the same times every  day, can help you:  Control your blood glucose.  Lower your risk of heart disease.  Improve your blood pressure.  Reach or maintain a healthy weight. What can affect my meal plan? Every person with diabetes is different, and each person has different needs for a meal plan. Your health care provider may recommend that you work with a dietitian to make a meal plan that is best for you. Your meal plan may vary depending on factors such as:  The calories you need.  The medicines you take.  Your weight.  Your blood glucose, blood pressure, and cholesterol levels.  Your activity level.  Other health conditions you have, such as heart or kidney disease. How do carbohydrates affect me? Carbohydrates, also called carbs, affect your blood glucose level more than any other type of food. Eating carbs naturally raises the amount of glucose in your blood. Carb counting is a method for keeping track of how many carbs you eat. Counting carbs is important to keep your blood glucose at a healthy level, especially if you use insulin or take certain oral diabetes medicines. It is important to know how many carbs you can safely have in each meal. This is different for every person. Your dietitian can help you calculate how many carbs you should have at each meal and for each snack. How does alcohol affect me? Alcohol can cause a  sudden decrease in blood glucose (hypoglycemia), especially if you use insulin or take certain oral diabetes medicines. Hypoglycemia can be a life-threatening condition. Symptoms of hypoglycemia, such as sleepiness, dizziness, and confusion, are similar to symptoms of having too much alcohol.  Do not drink alcohol if: ? Your health care provider tells you not to drink. ? You are pregnant, may be pregnant, or are planning to become pregnant.  If you drink alcohol: ? Do not drink on an empty stomach. ? Limit how much you use to:  0-1 drink a day for women.  0-2 drinks a day for men. ? Be aware of how much alcohol is in your drink. In the [place] one drink equals one 12 oz bottle of beer (355 mL), one 5 oz glass of wine (148 mL), or one 1 oz glass of hard liquor (44 mL). ? Keep yourself hydrated with water, diet soda, or unsweetened iced tea.  Keep in mind that regular soda, juice, and other mixers may contain a lot of sugar and must be counted as carbs. What are tips for following this plan? Reading food labels  Start by checking the serving size on the "Nutrition Facts" label of packaged foods and drinks. The amount of calories, carbs, fats, and other nutrients listed on the label is based on one serving of the item. Many items contain more than one serving per package.  Check the total grams (g) of carbs in one serving. You can calculate the number of servings of carbs in one serving by dividing the total carbs by 15. For example, if a food has 30 g of total carbs per serving, it would be equal to 2 servings of carbs.  Check the number of grams (g) of saturated fats and trans fats in one serving. Choose foods that have a low amount or none of these fats.  Check the number of milligrams (mg) of salt (sodium) in one serving. Most people should limit total sodium intake to less than 2,300 mg per day.  Always check the nutrition information of foods labeled as "low-fat" or "nonfat." These  foods may be higher in added sugar or refined carbs and should be avoided.  Talk to your dietitian to identify your daily goals for nutrients listed on the label. Shopping  Avoid buying canned, pre-made, or processed foods. These foods tend to be high in fat, sodium, and added sugar.  Shop around the outside edge of the grocery store. This is where you will most often find fresh fruits and vegetables, bulk grains, fresh meats, and fresh dairy. Cooking  Use low-heat cooking methods, such as baking, instead of high-heat cooking methods like deep frying.  Cook using healthy oils, such as olive, canola, or sunflower oil.  Avoid cooking with butter, cream, or high-fat meats. Meal planning  Eat meals and snacks regularly, preferably at the same times every day. Avoid going long periods of time without eating.  Eat foods that are high in fiber, such as fresh fruits, vegetables, beans, and whole grains. Talk with your dietitian about how many servings of carbs you can eat at each meal.  Eat 4-6 oz (112-168 g) of lean protein each day, such as lean meat, chicken, fish, eggs, or tofu. One ounce (oz) of lean protein is equal to: ? 1 oz (28 g) of meat, chicken, or fish. ? 1 egg. ?  cup (62 g) of tofu.  Eat some foods each day that contain healthy fats, such as avocado, nuts, seeds, and fish.   What foods should I eat? Fruits Berries. Apples. Oranges. Peaches. Apricots. Plums. Grapes. Mango. Papaya. Pomegranate. Kiwi. Cherries. Vegetables Lettuce. Spinach. Leafy greens, including kale, chard, collard greens, and mustard greens. Beets. Cauliflower. Cabbage. Broccoli. Carrots. Green beans. Tomatoes. Peppers. Onions. Cucumbers. Brussels sprouts. Grains Whole grains, such as whole-wheat or whole-grain bread, crackers, tortillas, cereal, and pasta. Unsweetened oatmeal. Quinoa. Brown or wild rice. Meats and other proteins Seafood. Poultry without skin. Lean cuts of poultry and beef. Tofu. Nuts.  Seeds. Dairy Low-fat or fat-free dairy products such as milk, yogurt, and cheese. The items listed above may not be a complete list of foods and beverages you can eat. Contact a dietitian for more information. What foods should I avoid? Fruits Fruits canned with syrup. Vegetables Canned vegetables. Frozen vegetables with butter or cream sauce. Grains Refined white flour and flour products such as bread, pasta, snack foods, and cereals. Avoid all processed foods. Meats and other proteins Fatty cuts of meat. Poultry with skin. Breaded or fried meats. Processed meat. Avoid saturated fats. Dairy Full-fat yogurt, cheese, or milk. Beverages Sweetened drinks, such as soda or iced tea. The items listed above may not be a complete list of foods and beverages you should avoid. Contact a dietitian for more information. Questions to ask a health care provider  Do I need to meet with a diabetes educator?  Do I need to meet with a dietitian?  What number can I call if I have questions?  When are the best times to check my blood glucose? Where to find more information:  American Diabetes Association: diabetes.org  Academy of Nutrition and Dietetics: www.eatright.[employer] of Diabetes and Digestive and Kidney Diseases: [place]inesFuneral.dk  Association of Diabetes Care and Education Specialists: www.diabeteseducator.org Summary  It is important to have healthy eating habits because your blood sugar (glucose) levels are greatly affected by what you eat and drink.  A healthy meal plan will help you control your blood glucose and maintain a healthy lifestyle.  Your health care provider may recommend that you work with a dietitian to make a meal  plan that is best for you.  Keep in mind that carbohydrates (carbs) and alcohol have immediate effects on your blood glucose levels. It is important to count carbs and to use alcohol carefully. This information is not intended to replace  advice given to you by your health care provider. Make sure you discuss any questions you have with your health care provider. Document Revised: 08/29/2019 Document Reviewed: 08/29/2019 Elsevier Patient Education  2021 [employer].   The patient verbalized understanding of instructions provided today and agreed to receive a mailed copy of patient instruction and/or educational materials.  Telephone follow up appointment with Managed Medicaid care management team member scheduled for:12/12/20 @ 1pm  Melissa Montane, RN  Following is a copy of your plan of care:  Patient Care Plan: Social Work - Depression (Adult)    Problem Identified: Depression Identification (Depression)     Goal: Psychiatry and Outpatient Therapy for Depressive Symptoms Completed 10/15/2020  Start Date: 08/28/2020  Expected End Date: 11/04/2020  This Visit's Progress: On track  Recent Progress: On track  Priority: High  Note:    Current Barriers:  . 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 . Transportation - Due to husband's treatment, he is not always available to transport patient to her appointments.  Marland Kitchen Update 08/28/2020: Transportation issues still create barriers for patient. She is aware that she can contact her Medicaid plan, [employer], for transportation assistance. Marland Kitchen Update 09/25/2020: Patient was unable to keep previously scheduled appointment on 25/02/3975 due to conflict with husband's appointments. . Mental Health Concerns  . Patient's husband has cancer and is currently receiving treatment at the [place] in [place]. . Suicidal Ideation/Homicidal Ideation: No  Clinical Social Work Goal(s):  Marland Kitchen Over the next 60 days, patient will work with SW bi-weekly by telephone or in person to reduce or manage symptoms related to depression. . Over the next 30 days, patient will attend all scheduled mental health appointments: as scheduled. Patient will  arrange transportation needs in the appropriate time frame to ensure she has transportation for scheduled appointments.  . Over the next 30 days, patient will demonstrate improved health management independence as evidenced by symptom management.   Interventions: . Patient interviewed and appropriate assessments performed: PHQ 2 and PHQ 9 . Patient interviewed and appropriate assessments performed . Provided patient with information about depression and physical illness. . Advised patient to discuss outpatient therapy with psychiatrist. Patient is scheduled for psychiatry at [facility] on 08/15/2020. LCSW encouraged patient to discuss therapy needs with the psychiatrist during this appointment so that she can receive both psychiatry and outpatient therapy at the same agency.  Marland Kitchen Update 08/28/2020: Patient stated she had to cancel the appointment due to transportation issues and reschedule for today, 08/28/2020 @ 2:30pm. She stated she would like to discuss therapy needs at that time.  Marland Kitchen Update 09/25/2020: Patient was unable to keep previously scheduled appointment on [id number] due to conflict with husband's appointments. Appointment was rescheduled to Monday, 09/30/2020. Marland Kitchen Emotional/Supportive Counseling . Update 09/25/2020: LCSW will follow up on October 15, 2020 @ 2:00pm. . Update 10/15/2020: Patient confirmed psychiatric appointment attendance at [facility]. She stated the psychiatrist is scheduling her with a therapist at the same agency.  Patient Self Care Activities:  . Self administers medications as prescribed . Attends church or other social activities . Ability for insight . Motivation for treatment . Strong family or social support  Patient Coping Strengths:  . Supportive Relationships . Family .  Friends . Church . [employer] . Spirituality . Hopefulness  Patient Self Care Deficits:  . Unable to independently get around without assistance due to arthritis all over her  body. . Unable to perform ADLs independently . Unable to perform IADLs independently . Husband has cancer. . Transportation issues due to husband receiving treatment for cancer.  Please see past updates related to this goal by clicking on the "Past Updates" button in the selected goal    Patient Care Plan: Weight loss to Improve Health    Problem Identified: Weight Management     Long-Range Goal: Achieving Weight Loss   Start Date: 08/22/2020  Expected End Date: 12/12/2020  Recent Progress: On track  Priority: High  Note:    Current Barriers:  Marland Kitchen Knowledge Deficits related to Weight loss. Robin [name] reports that she needs to work on weight loss. She is needing knee surgery, but needs to lose weight before having the procedure. . Unable to independently develop a plan for weight loss . Robin [name] is requesting a bedside commode, wheelchair and CPAP to better manage her health at home   Nurse Case Manager Clinical Goal(s):  Marland Kitchen Over the next 30 days, patient will verbalize understanding of plan for weight management-Met-Robin [name] reports eating most meals at home, prepared by her husband. They have cut out all potatoes, rice and pasta, eating whole wheat bread and increased vegetables. She has been unable to exercise due to knee pain and is interested in Aquatic Therapy. Robin [name] will begin Aquatic Therapy on 11/14/20 . Over the next 60 days, patient will meet with RN Care Manager to address ways to achieve weight loss . Over the next 30 days, patient will work with CM team pharmacist to review medications-Met  Interventions:  . Inter-disciplinary care team collaboration (see longitudinal plan of care) . Provided education to patient re: diet modification, diabetes and exercise -RNCM will resend in the mail . Discussed plans with patient for ongoing care management follow up and provided patient with direct contact information for care management team . Pharmacy referral for medication  review . RNCM explained to patient the importance of managing diabetes and how it can help with weight loss . Collaborate with PCP for Aquatic Therapy referral-referral placed first appointment 11/14/20 . Reviewed medications discussed that she has stopped taking gabapentin and she feels some improvement. . Discussed exercises that she can do while sitting . Reviewed upcoming appointments  . Collaborate with PCP regarding requested DME  Patient Goals/Self-Care Activities Over the next 30 days, patient will:  -Self administers medications as prescribed Attends all scheduled provider appointments Calls provider office for new concerns or questions - change to whole grain breads, cereal, pasta - drink 6 to 8 glasses of water each day - eat 3 to 5 servings of fruits and vegetables each day - eat 5 or 6 small meals each day - limit fast food meals to no more than 1 per week - manage portion size - Eat three small meals a day instead of 2 larger meals - Cut out regular soda - keep a food diary - reduce red meat to 2 to 3 times a week  Follow Up Plan: The Managed Medicaid care management team will reach out to the patient again over the next 30 days on 12/12/20 @ 1pm

## 2020-11-12 NOTE — Patient Outreach (Signed)
Medicaid Managed Care   Nurse Care Manager Note  11/12/2020 Name:  Robin [name] MRN:  [id number] DOB:  [birth year]  Robin [name] is an 65 y.o. year old female who is a primary patient of Robin Pier, MD.  The [facility] Managed Care Coordination team was consulted for assistance with:    HTN DMII  [name] was given information about Medicaid Managed Care Coordination team services today. Robin [name] agreed to services and verbal consent obtained.  Engaged with patient by telephone for follow up visit in response to provider referral for case management and/or care coordination services.   Assessments/Interventions:  Review of past medical history, allergies, medications, health status, including review of consultants reports, laboratory and other test data, was performed as part of comprehensive evaluation and provision of chronic care management services.  SDOH (Social Determinants of Health) assessments and interventions performed:   Care Plan          Patient Active Problem List   Diagnosis Date Noted  . Adjustment disorder with mixed disturbance of emotions and conduct 07/28/2020  . Polyp of descending colon 02/15/2020  . Morbid obesity with BMI of 60.0-69.9, adult ([place]) 07/17/2019  . Pedal edema 07/17/2019  . Multilevel degenerative disc disease 10/31/2018  . Abdominal pain 10/28/2018  . Change in bowel function 10/28/2018  . Severe episode of recurrent major depressive disorder, with psychotic features ([place]) 05/26/2018  . Gastritis and gastroduodenitis 04/26/2018  . Atrophic vaginitis 04/01/2018  . Diabetes mellitus ([place]) 03/31/2018  . Cough, persistent 02/08/2018  . Dysphagia 12/14/2017  . 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 Urinary incontinence 07/23/2017  . Early satiety 06/16/2017  . Hx of iron deficiency anemia 05/28/2017  . Throat pain in adult 04/05/2017  . Prediabetes 02/04/2017  . Dyspnea on exertion 12/23/2016  . 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 Hemoptysis 12/23/2016  . Fibromyalgia 08/18/2016  . Chronic lymphocytic thyroiditis 08/18/2016  . Depression 04/01/2016  . OSA (obstructive sleep apnea) 04/01/2016  . Essential hypertension 12/09/2015  . Hypothyroidism 12/09/2015  . Morbid obesity due to excess calories ([place]) 12/09/2015  . Constipation 05/27/2015  . Fatty liver 05/27/2015  . Abdominal pain, chronic, epigastric 07/04/2012  . Anxiety 06/09/2012  . History of gastroesophageal reflux (GERD) 06/09/2012  . Hyperlipidemia 06/09/2012  . Refusal of blood transfusions as patient is Jehovah's Witness 06/09/2012  . 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 Abnormal small bowel biopsy 09/18/2011  . Lactose intolerance 09/18/2011  . GERD 01/21/2010    Conditions to be addressed/monitored per PCP order:  HTN and DMII  Care Plan : Weight loss to Improve Health  Updates made by Robin Montane, RN since 11/12/2020 12:00 AM    Problem: Weight Management     Long-Range Goal: Achieving Weight Loss   Start Date: 08/22/2020  Expected End Date: 12/12/2020  Recent Progress: On track  Priority: High  Note:    Current Barriers:  Marland Kitchen Knowledge Deficits related to Weight loss. [name] reports that she needs to work on weight loss. She is needing knee surgery, but needs to lose weight before having the procedure. . Unable to independently develop a plan for weight loss . Ms Robin [name] is requesting a bedside commode, wheelchair and CPAP to better manage her health at home   Nurse Case Manager Clinical Goal(s):  Marland Kitchen Over the next 30 days, patient will verbalize understanding of plan for weight management-Met-Ms Robin [name] reports eating most meals at home, prepared by  her husband. They have cut out all potatoes, rice and pasta, eating whole wheat bread and increased vegetables. She has been unable to exercise due to knee pain and is interested in Aquatic Therapy. Ms Robin [name] will begin Aquatic  Therapy on 11/14/20 . Over the next 60 days, patient will meet with RN Care Manager to address ways to achieve weight loss . Over the next 30 days, patient will work with CM team pharmacist to review medications-Met  Interventions:  . Inter-disciplinary care team collaboration (see longitudinal plan of care) . Provided education to patient re: diet modification, diabetes and exercise -RNCM will resend in the mail . Discussed plans with patient for ongoing care management follow up and provided patient with direct contact information for care management team . Pharmacy referral for medication review . RNCM explained to patient the importance of managing diabetes and how it can help with weight loss . Collaborate with PCP for Aquatic Therapy referral-referral placed first appointment 11/14/20 . Reviewed medications discussed that she has stopped taking gabapentin and she feels some improvement. . Discussed exercises that she can do while sitting . Reviewed upcoming appointments  . Collaborate with PCP regarding requested DME  Patient Goals/Self-Care Activities Over the next 30 days, patient will:  -Self administers medications as prescribed Attends all scheduled provider appointments Calls provider office for new concerns or questions - change to whole grain breads, cereal, pasta - drink 6 to 8 glasses of water each day - eat 3 to 5 servings of fruits and vegetables each day - eat 5 or 6 small meals each day - limit fast food meals to no more than 1 per week - manage portion size - Eat three small meals a day instead of 2 larger meals - Cut out regular soda - keep a food diary - reduce red meat to 2 to 3 times a week  Follow Up Plan: The Managed Medicaid care management team will reach out to the patient again over the next 30 days on 12/12/20 @ 1pm       Follow Up:  Patient agrees to Care Plan and Follow-up.  Plan: The Managed Medicaid care management team will reach out to the  patient again over the next 30 days.  Date/time of next scheduled RN care management/care coordination outreach:12/12/20 @ 1pm  Robin Joiner RN, [place] RN Care Coordinator

## 2020-11-13 ENCOUNTER — Encounter: Payer: Self-pay | Admitting: Internal Medicine

## 2020-11-13 ENCOUNTER — Ambulatory Visit: Payer: Medicaid Other | Admitting: Gastroenterology

## 2020-11-13 ENCOUNTER — Telehealth: Payer: Self-pay | Admitting: Internal Medicine

## 2020-11-13 ENCOUNTER — Other Ambulatory Visit: Payer: Self-pay | Admitting: Gastroenterology

## 2020-11-13 DIAGNOSIS — M158 Other polyosteoarthritis: Secondary | ICD-10-CM

## 2020-11-13 DIAGNOSIS — G4733 Obstructive sleep apnea (adult) (pediatric): Secondary | ICD-10-CM

## 2020-11-13 NOTE — Telephone Encounter (Signed)
-----   Message from Melissa Montane, RN sent at 11/12/2020 11:39 AM EST ----- Hi Dr. Wynetta Emery,  Ms Diss is requesting a bedside commode and wheelchair due to her knee pain and decreased mobility. She is starting Aquatic therapy this week. She has also mentioned that she was suppose to get a CPAP and never got it. She is not sure what she did with the prescription. It looks like she had the sleep study last April. Ms Balles is scheduled to see you next on 2/25. Thank you  [name] RN, BSN [place] RN Care Coordinator

## 2020-11-13 NOTE — Telephone Encounter (Signed)
Prescriptions generated for bedside commode, manual wheelchair and CPAP machine.  I will have my CMA touch base with patient so that she can pick up the prescription or give instructions on which medical supply store to send these prescriptions to.

## 2020-11-14 ENCOUNTER — Other Ambulatory Visit: Payer: Self-pay

## 2020-11-14 ENCOUNTER — Ambulatory Visit: Payer: Medicaid Other | Attending: Internal Medicine

## 2020-11-14 DIAGNOSIS — R262 Difficulty in walking, not elsewhere classified: Secondary | ICD-10-CM | POA: Insufficient documentation

## 2020-11-14 DIAGNOSIS — M25652 Stiffness of left hip, not elsewhere classified: Secondary | ICD-10-CM | POA: Insufficient documentation

## 2020-11-14 DIAGNOSIS — M6281 Muscle weakness (generalized): Secondary | ICD-10-CM | POA: Diagnosis not present

## 2020-11-14 DIAGNOSIS — M25651 Stiffness of right hip, not elsewhere classified: Secondary | ICD-10-CM | POA: Insufficient documentation

## 2020-11-14 MED FILL — LINZESS 290 MCG CAPSULE: 290 | 30 days supply | Qty: 30 | Fill #0

## 2020-11-14 NOTE — Therapy (Signed)
[facility] [address], [place], [place], [address] Phone: [phone]   Fax:  [phone]  Physical Therapy Evaluation  Patient Details  Name: Robin [name] MRN: [id number] Date of Birth: 1955-11-25 Referring Provider (Robin [name]): Karle Plumber, MD   Encounter Date: 11/14/2020   Robin [name] End of Session - 11/14/20 1003    Visit Number 1    Date for Robin [name] Re-Evaluation 01/09/21    Authorization Type Medicaid: submitted authorization for visits    Robin [name] Start Time 0932    Robin [name] Stop Time 1007    Robin [name] Time Calculation (min) 35 min    Activity Tolerance Patient limited by fatigue    Behavior During Therapy [facility] for tasks assessed/performed           Past Medical History:  Diagnosis Date  . Anxiety disorder   . Arthritis   . Asthma   . Chronic neck pain   . Constipation   . Depression   . Diabetes mellitus without complication ([place])   . Dyspnea   . GERD (gastroesophageal reflux disease)   . Hiatal hernia    small  . Hyperlipidemia   . Hypertension   . Hypothyroidism   . Morbid obesity with BMI of 50.0-59.9, adult ([place])   . Schatzki's ring    non critical  . Sinusitis   . Sleep apnea    CPAP, Sleep study at [facility]  . Sleep apnea     Past Surgical History:  Procedure Laterality Date  . ABDOMINAL HYSTERECTOMY    . ABDOMINAL HYSTERECTOMY  02/18/2000  . CARDIAC CATHETERIZATION  08/18/2003   normal L main/LAD/L Cfx/RCA (Dr. Adora Fridge)  . COLONOSCOPY  2006   Dr. [name], hyperplastic polyps  . COLONOSCOPY  08/06/11   abnormal terminal ileum for 10cm, erosions, geographical ulceration. Bx small bowel mucosa with prominent intramucosal lymphoid aggregates, slightly inflammed  . COLONOSCOPY WITH PROPOFOL N/A 12/28/2019   Rourk: 4 sessile serrated adenomas removed on the colon.  Next colonoscopy in 3 years.  Marland Kitchen DIRECT LARYNGOSCOPY  03/15/2012   Procedure: DIRECT LARYNGOSCOPY;  Surgeon: [name], MD;   Location: [place];  Service: ENT;  Laterality: N/A; Dr. Wolicki--:>no foreign body seen. normal esophagus to 40cm  . ESOPHAGEAL DILATION  04/17/2015   Procedure: ESOPHAGEAL DILATION;  Surgeon: [name], MD;  [facility];  Service: Endoscopy;;  . ESOPHAGOGASTRODUODENOSCOPY  08/23/2008   VOH:YWVP distal esophageal erosions consistent with mild erosive reflux esophagitis, otherwise unremarkable esophagus/ Tiny antral erosions of doubtful clinical significance, otherwise normal stomach, patent pylorus, normal D1 and D2  . ESOPHAGOGASTRODUODENOSCOPY  08/06/11   small hh, noncritical Schatzki's ring s/p 84 F  . ESOPHAGOGASTRODUODENOSCOPY N/A 04/17/2015   XTG:GYIRSW s/p dilation  . ESOPHAGOGASTRODUODENOSCOPY (EGD) WITH PROPOFOL N/A 01/20/2018   Dr. [name]: Erosive gastropathy, normal-appearing esophagus status post empiric dilation.  Chronic gastritis, no H. pylori.  . ESOPHAGOSCOPY  03/15/2012   Procedure: ESOPHAGOSCOPY;  Surgeon: [name], MD;  Location: [place];  Service: ENT;  Laterality: N/A;  . KNEE ARTHROSCOPY  04/27/2001  . MALONEY DILATION N/A 01/20/2018   Procedure: Venia Minks DILATION;  Surgeon: [name], MD;  [facility];  Service: Endoscopy;  Laterality: N/A;  . [place]  2003   negative bruce protocol exercise tress test; EF 68%; intermediate risk study due to evidence of anterior wall ischemia extending from mid-ventricle to apex  . PITUITARY SURGERY  06/2012  benign tumor, surgeon at [facility]  . POLYPECTOMY  12/28/2019   Procedure: POLYPECTOMY;  Surgeon: [name], MD;  [facility];  Service: Endoscopy;;  . RECTOCELE REPAIR    . TRANSTHORACIC ECHOCARDIOGRAM  2003   EF normal  . VAGINAL PROLAPSE REPAIR    . VIDEO BRONCHOSCOPY Bilateral 03/08/2017   Procedure: VIDEO BRONCHOSCOPY WITHOUT FLUORO;  Surgeon: Javier Glazier, MD;  Location: [place];  Service: Cardiopulmonary;  Laterality: Bilateral;    There were no  vitals filed for this visit.    Subjective Assessment - 11/14/20 0939    Subjective Robin [name] presents to Robin [name] with physical deconditioning.  Robin [name] utilizes a motorized wheelchair for community distances and walks with a walker at home.  Robin [name] does regular videos for seated exercises.    Pertinent History use of motorized W/C due to weakness and LE pain.    Limitations Standing;Walking    How long can you stand comfortably? 5 min max    How long can you walk comfortably? 20-25 steps with walker    Patient Stated Goals get stronger, walk more, stand longer    Currently in Pain? Yes    Pain Score 8     Pain Location Knee   shoulders/hands- very widespread pain today   Pain Orientation Right;Left    Pain Descriptors / Indicators Aching;Sore    Pain Type Chronic pain    Pain Onset More than a month ago    Aggravating Factors  movement, standing and walking    Pain Relieving Factors pain medication, topical rub              [facility] Robin [name] Assessment - 11/14/20 0001      Assessment   Medical Diagnosis physical deconditioning    Referring Provider (Robin [name]) Karle Plumber, MD    Onset Date/Surgical Date --   2002   Next MD Visit 11/29/20    Prior Therapy none      Precautions   Precautions None      Restrictions   Weight Bearing Restrictions No      Balance Screen   Has the patient fallen in the past 6 months No    Has the patient had a decrease in activity level because of a fear of falling?  No    Is the patient reluctant to leave their home because of a fear of falling?  No      Home Environment   Living Environment Private residence    Living Arrangements Spouse/significant other    Type of [place] entrance    Entrance Stairs-Number of Steps 3-4    Entrance Stairs-Rails None    Home Layout One level    Additional Comments scoots around old computer chair in the house      Prior Function   Level of Independence Independent with homemaking with wheelchair;Needs  assistance with ADLs;Needs assistance with homemaking    Vocation Retired    Leisure none    Comments Robin [name] requires assistance with meal prep, dressing and self-care      Cognition   Overall Cognitive Status Within Functional Limits for tasks assessed      Posture/Postural Control   Posture/Postural Control Postural limitations    Postural Limitations Forward head;Rounded Shoulders;Flexed trunk    Posture Comments bil knee flexion in standing      ROM / Strength   AROM / PROM / Strength AROM;PROM;Strength      AROM   Overall  AROM  Unable to assess    Overall AROM Comments Bil knee and hip flexion in standing due to reduced muscle length in bil legs, UE A/ROM is limited by 25%      PROM   Overall PROM  Unable to assess    Overall PROM Comments due to mobility      Strength   Overall Strength Deficits    Overall Strength Comments bil hips in sitting 4/5, knees 4/5, ankle DF 4-/5      Palpation   Palpation comment NA      Transfers   Transfers Stand to Sit;Sit to Stand    Sit to Stand 6: Modified independent (Device/Increase time);With armrests;With upper extremity assist    Stand to Sit 6: Modified independent (Device/Increase time);With armrests;With upper extremity assist    Comments Robin [name] safely transfers from wheelchair to chair with stand by assistance      Ambulation/Gait   Ambulation/Gait Yes    Ambulation/Gait Assistance 5: Supervision    Ambulation Distance (Feet) 25 Feet    Gait Pattern Step-through pattern    Ambulation Surface Level    Gait Comments slow mobility x 25 feet with rolling walker.  Robin [name] was out of breath and used inhaler after walking 25 feet.  bil knee hip and trunk flexion                      Objective measurements completed on examination: See above findings.               Robin [name] Education - 11/14/20 1001    Education Details Access Code: [email or web address], aquatic info    Person(s) Educated Patient    Methods  Explanation;Demonstration;Handout    Comprehension Verbalized understanding;Returned demonstration            Robin [name] Short Term Goals - 11/14/20 0944      Robin [name] SHORT TERM GOAL #1   Title independent with initial HEP    Time 4    Period Weeks    Status New    Target Date 12/12/20      Robin [name] SHORT TERM GOAL #2   Title walk with rolling walker x 30 feet and demonstrate moderate shortness of breath    Time 4    Period Weeks    Status New    Target Date 12/12/20      Robin [name] SHORT TERM GOAL #3   Title initiate a walking program at home to improve ambulation and begin to wean from wheelchair use at home    Time 4    Period Weeks    Status New    Target Date 12/12/20             Robin [name] Long Term Goals - 11/14/20 1024      Robin [name] LONG TERM GOAL #1   Title Robin [name] will be independent with progression of final HEP    Baseline --    Time 8    Period Weeks    Status New    Target Date 01/09/21      Robin [name] LONG TERM GOAL #2   Title improve strength to wean from wheelchair use > or = to 30% of the time for home distances    Baseline using W/C > 90% of the time    Time 8    Period Weeks    Status New    Target Date 01/09/21      Robin [name] LONG TERM GOAL #3   Title improve LE  flexibility to stand erect with standing x 1 minute    Baseline flexed knees, hip and trunk    Time 8    Period Weeks    Status New    Target Date 01/09/21      Robin [name] LONG TERM GOAL #4   Title improve strength and endurance to walk 50-75 feet with rolling walker with moderate shortness of breath    Baseline 25 feet with shortness of breath that required use of inhaler    Time 8    Period Weeks    Status New    Target Date 01/09/21      Robin [name] LONG TERM GOAL #5   Title perform standing exercise for 3-5 minutes in the clinic to improve endurance and independence with meal prep and self care tasks    Baseline 30 seconds    Time 8    Period Weeks    Status New    Target Date 01/09/21                  Plan - 11/14/20 1014     Clinical Impression Statement Robin [name] presents to Robin [name] with deconditioning.  Robin [name] is obese and immobile in a motorized wheelchair scooter.  Robin [name] would like to pursue aquatic Robin [name] offered at this location to improve mobility and strength/endurance.  Robin [name] has been using the wheelchair for ~1 year due to widespread pain and progressive weakness due to immobility.  Robin [name] has complicated medical history with OA, fibromyalgia, depression and obesity.  Robin [name] transfers independently with slow mobility from wheelchair to the chair and ambulated 25 feet with a rolling walker.  Robin [name] with shortness of breath after walking and required use of inhaler.  Robin [name] with reduced knee extension due to shortened hamstring length and reduced hip extension due to shortened hip flexor length. Robin [name] reports 6/10 widespread pain today.  Robin [name] will benefit from a combination of land and aquatic Robin [name] to address generalized muscle weakness, reduced flexibility, reduced endurance and reduced tolerance for standing and gait.    Personal Factors and Comorbidities Comorbidity 3+    Comorbidities obesity, W/C x 1 year, fibromyalgia, depression    Examination-Activity Limitations Stand;Stairs;Squat;Locomotion Level;Transfers;Dressing;Hygiene/Grooming    Examination-Participation Restrictions Cleaning;Community Activity;Meal Prep    Stability/Clinical Decision Making Evolving/Moderate complexity    Clinical Decision Making Moderate    Rehab Potential Good    Robin [name] Frequency 2x / week    Robin [name] Duration 8 weeks    Robin [name] Treatment/Interventions ADLs/Self Care Home Management;Cryotherapy;Moist Heat;Neuromuscular re-education;Therapeutic exercise;Therapeutic activities;Functional mobility training;Stair training;Gait training;Patient/family education;Wheelchair mobility training;Manual techniques;Energy conservation;Passive range of motion;Taping;Aquatic Therapy    Robin [name] Next Visit Plan work on transitions, hip and knee flexibility, gait with rolling walker.  Aquatic Robin [name] when available     Robin [name] Home Exercise Plan Access Code: [email or web address]    Consulted and Agree with Plan of Care Patient           Patient will benefit from skilled therapeutic intervention in order to improve the following deficits and impairments:  Abnormal gait,Decreased activity tolerance,Decreased balance,Decreased strength,Postural dysfunction,Impaired flexibility,Hypomobility,Pain,Decreased endurance,Difficulty walking,Decreased range of motion  Visit Diagnosis: Muscle weakness (generalized) - Plan: Robin [name] plan of care cert/re-cert  Difficulty in walking, not elsewhere classified - Plan: Robin [name] plan of care cert/re-cert  Stiffness of left hip, not elsewhere classified - Plan: Robin [name] plan of care cert/re-cert  Stiffness of right hip, not elsewhere classified - Plan: Robin [name] plan of care cert/re-cert     Problem List Patient Active Problem List   Diagnosis Date  Noted  . Adjustment disorder with mixed disturbance of emotions and conduct 07/28/2020  . Polyp of descending colon 02/15/2020  . Morbid obesity with BMI of 60.0-69.9, adult ([place]) 07/17/2019  . Pedal edema 07/17/2019  . Multilevel degenerative disc disease 10/31/2018  . Abdominal pain 10/28/2018  . Change in bowel function 10/28/2018  . Severe episode of recurrent major depressive disorder, with psychotic features ([place]) 05/26/2018  . Gastritis and gastroduodenitis 04/26/2018  . Atrophic vaginitis 04/01/2018  . Diabetes mellitus ([place]) 03/31/2018  . Cough, persistent 02/08/2018  . Dysphagia 12/14/2017  . 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 Urinary incontinence 07/23/2017  . Early satiety 06/16/2017  . Hx of iron deficiency anemia 05/28/2017  . Throat pain in adult 04/05/2017  . Prediabetes 02/04/2017  . Dyspnea on exertion 12/23/2016  . 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 Hemoptysis 12/23/2016  . Fibromyalgia 08/18/2016  . Chronic lymphocytic thyroiditis 08/18/2016  . Depression 04/01/2016  . OSA  (obstructive sleep apnea) 04/01/2016  . Essential hypertension 12/09/2015  . Hypothyroidism 12/09/2015  . Morbid obesity due to excess calories ([place]) 12/09/2015  . Constipation 05/27/2015  . Fatty liver 05/27/2015  . Abdominal pain, chronic, epigastric 07/04/2012  . Anxiety 06/09/2012  . History of gastroesophageal reflux (GERD) 06/09/2012  . Hyperlipidemia 06/09/2012  . Refusal of blood transfusions as patient is Jehovah's Witness 06/09/2012  . 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 Abnormal small bowel biopsy 09/18/2011  . Lactose intolerance 09/18/2011  . GERD 01/21/2010     Robin [name], Robin [name] 11/14/20 10:45 AM  [facility] [address] [place], [place], [address] Phone: [phone]   Fax:  [phone]  Name: Robin [name] MRN: [id number] Date of Birth: 1956-01-18

## 2020-11-14 NOTE — Patient Instructions (Addendum)
Access Code: [facility] URL: [email or web address] Date: 11/14/2020 Prepared by: Claiborne Billings  Exercises Seated Hamstring Stretch - 3 x daily - 7 x weekly - 1 sets - 3 reps - 20 hold Seated Long Arc Quad - 3 x daily - 7 x weekly - 10 reps - 2 sets - 5 hold Seated March - 3 x daily - 7 x weekly - 10 reps - 3 sets Seated Heel Toe Raises - 3 x daily - 7 x weekly - 10 reps - 2 sets  Aquatic Therapy:  Keeping Everyone Safe!!!  We are so excited to be back in the pool for therapy and can't wait to see you in the water!! Having said that, we also want to make sure that we keep you, your family member, and everyone one else safe.  We have been in touch with our national aquatic association as well as the CDC to develop safe guidelines for aquatic therapy.  First, we want to assure you that the water is one of the safest places to be right now - chlorine and bromine kill the virus and the CDC states the virus cannot be transmitted in a pool; that's great news for [name]! Below are specific guidelines from the CDC that we will ask you and your caregiver to follow when coming to the [facility] for therapy. 1. Please shower AT HOME and come in your bathing suit and cover up to the pool. 2. Plan to leave in your suit and cover up and change at home - this decreases or eliminates time you will spend in the locker room. 3. Follow the Aquatic Center's guidelines to use bathroom facilities as needed (they will educate your caregiver on these guidelines as they enter the Center). 4. Your caregiver will be checked in and screened by the [facility] (there will be a table outside at the front door).  5. We will meet YOU at the door and escort you into the pool directly - you will not need to be screened by the Center.  We will screen as you enter with [name].  Your caregiver will join [name] pool side after check in. 6. Masks:  your caregiver must wear a mask at all times. The CDC recommends that  patients and therapists wear their masks until we enter the water and then put them back on as we leave the water. PLEASE BRING A PLASTIC BAG TO STORE YOUR MASK IN WHILE WE ARE IN THE POOL.   Additional safety measures: 1. The [facility] will be practicing social distancing, wearing masks and implementing a stringent cleaning program.  2. We will only be using hard surface equipment and we will be cleaning it between all patients. 3. We will be cleaning hand rails used to enter the pool and chairs that your caregiver might use. 4. We as employees of [employer] must complete a screening every day we work prior to our shift. 5. We are only offering aquatic therapy to patients who have been determined to be at low risk for the virus - we are happy to share that screen with you if you are interested.  We are excited to be able to offer aquatic therapy again in addition to services at our outpatient clinic - thank you for the opportunity to serve you!  Myrene Galas, PTA [phone] Leone Payor, PT

## 2020-11-14 NOTE — Telephone Encounter (Signed)
Caller from home care calling again to check on the status of the certificate of Medical Necessity. Caller said she will re-fax everything over again. Please advise

## 2020-11-15 NOTE — Telephone Encounter (Signed)
Received paperwork again from company. I have original paperwork. Original has been faxed

## 2020-11-18 NOTE — Telephone Encounter (Signed)
Contacted pt to see what medical supply company she wanted her rxs sent to pt didn't states she doesn't know... Made pt aware that rxs will be faxed to [facility]. Pt states she understands and doesn't have any questions or concerns

## 2020-11-18 NOTE — Telephone Encounter (Signed)
Anderson Malta from [employer] called and requested the latest doctors notes on file in order to order the wheelchair and bedside commode. Caller also needs the patients height and weight.

## 2020-11-20 NOTE — Telephone Encounter (Signed)
Pt has an appt with provider on 2/25. Will be able to address everything in ov at that visit to send notes to [employer]

## 2020-11-20 NOTE — Telephone Encounter (Signed)
Dusti, from [employer], calling stating that she is needing to have the office notes, weight, and height in order to get the correct wheelchair to pt. She is requesting to have these as soon as possible. Please advise.     Fax # [phone] Callback# [phone]

## 2020-11-21 DIAGNOSIS — M79676 Pain in unspecified toe(s): Secondary | ICD-10-CM

## 2020-11-21 NOTE — Telephone Encounter (Signed)
Message from [place], [place] noted.  Patient has OV on 2/25. Will address at [place].   Forward to [place] as a [employer]

## 2020-11-27 ENCOUNTER — Ambulatory Visit: Payer: Medicaid Other | Admitting: Physical Therapy

## 2020-11-29 ENCOUNTER — Other Ambulatory Visit: Payer: Self-pay

## 2020-11-29 ENCOUNTER — Encounter: Payer: Self-pay | Admitting: Internal Medicine

## 2020-11-29 ENCOUNTER — Ambulatory Visit: Payer: Medicaid Other | Attending: Internal Medicine | Admitting: Internal Medicine

## 2020-11-29 DIAGNOSIS — F322 Major depressive disorder, single episode, severe without psychotic features: Secondary | ICD-10-CM

## 2020-11-29 DIAGNOSIS — Z833 Family history of diabetes mellitus: Secondary | ICD-10-CM | POA: Insufficient documentation

## 2020-11-29 DIAGNOSIS — E039 Hypothyroidism, unspecified: Secondary | ICD-10-CM | POA: Insufficient documentation

## 2020-11-29 DIAGNOSIS — M158 Other polyosteoarthritis: Secondary | ICD-10-CM

## 2020-11-29 DIAGNOSIS — I10 Essential (primary) hypertension: Secondary | ICD-10-CM | POA: Diagnosis not present

## 2020-11-29 DIAGNOSIS — Z79899 Other long term (current) drug therapy: Secondary | ICD-10-CM | POA: Insufficient documentation

## 2020-11-29 DIAGNOSIS — Z6841 Body Mass Index (BMI) 40.0 and over, adult: Secondary | ICD-10-CM | POA: Diagnosis not present

## 2020-11-29 DIAGNOSIS — E1169 Type 2 diabetes mellitus with other specified complication: Secondary | ICD-10-CM

## 2020-11-29 DIAGNOSIS — Z87891 Personal history of nicotine dependence: Secondary | ICD-10-CM | POA: Insufficient documentation

## 2020-11-29 DIAGNOSIS — K219 Gastro-esophageal reflux disease without esophagitis: Secondary | ICD-10-CM | POA: Diagnosis not present

## 2020-11-29 DIAGNOSIS — E119 Type 2 diabetes mellitus without complications: Secondary | ICD-10-CM | POA: Diagnosis not present

## 2020-11-29 DIAGNOSIS — F332 Major depressive disorder, recurrent severe without psychotic features: Secondary | ICD-10-CM | POA: Diagnosis not present

## 2020-11-29 DIAGNOSIS — Z7982 Long term (current) use of aspirin: Secondary | ICD-10-CM | POA: Diagnosis not present

## 2020-11-29 DIAGNOSIS — R269 Unspecified abnormalities of gait and mobility: Secondary | ICD-10-CM

## 2020-11-29 DIAGNOSIS — M1990 Unspecified osteoarthritis, unspecified site: Secondary | ICD-10-CM | POA: Diagnosis not present

## 2020-11-29 DIAGNOSIS — Z8249 Family history of ischemic heart disease and other diseases of the circulatory system: Secondary | ICD-10-CM | POA: Insufficient documentation

## 2020-11-29 DIAGNOSIS — Z7984 Long term (current) use of oral hypoglycemic drugs: Secondary | ICD-10-CM | POA: Insufficient documentation

## 2020-11-29 DIAGNOSIS — M797 Fibromyalgia: Secondary | ICD-10-CM | POA: Insufficient documentation

## 2020-11-29 DIAGNOSIS — Z888 Allergy status to other drugs, medicaments and biological substances status: Secondary | ICD-10-CM | POA: Insufficient documentation

## 2020-11-29 DIAGNOSIS — G4733 Obstructive sleep apnea (adult) (pediatric): Secondary | ICD-10-CM | POA: Diagnosis not present

## 2020-11-29 MED ORDER — METFORMIN HCL 500 MG PO TABS
ORAL_TABLET | ORAL | 5 refills | Status: DC
Start: 2020-11-29 — End: 2021-03-19

## 2020-11-29 NOTE — Progress Notes (Signed)
Pt states her her pain is coming from all over

## 2020-11-29 NOTE — Patient Instructions (Signed)
Increase Metformin to 1000 mg in the mornings and 500 mg in the evenings.    Healthy Eating Following a healthy eating pattern may help you to achieve and maintain a healthy body weight, reduce the risk of chronic disease, and live a long and productive life. It is important to follow a healthy eating pattern at an appropriate calorie level for your body. Your nutritional needs should be met primarily through food by choosing a variety of nutrient-rich foods. What are tips for following this plan? Reading food labels  Read labels and choose the following: ? Reduced or low sodium. ? Juices with 100% fruit juice. ? Foods with low saturated fats and high polyunsaturated and monounsaturated fats. ? Foods with whole grains, such as whole wheat, cracked wheat, brown rice, and wild rice. ? Whole grains that are fortified with folic acid. This is recommended for women who are pregnant or who want to become pregnant.  Read labels and avoid the following: ? Foods with a lot of added sugars. These include foods that contain brown sugar, corn sweetener, corn syrup, dextrose, fructose, glucose, high-fructose corn syrup, honey, invert sugar, lactose, malt syrup, maltose, molasses, raw sugar, sucrose, trehalose, or turbinado sugar.  Do not eat more than the following amounts of added sugar per day:  6 teaspoons (25 g) for women.  9 teaspoons (38 g) for men. ? Foods that contain processed or refined starches and grains. ? Refined grain products, such as white flour, degermed cornmeal, white bread, and white rice. Shopping  Choose nutrient-rich snacks, such as vegetables, whole fruits, and nuts. Avoid high-calorie and high-sugar snacks, such as potato chips, fruit snacks, and candy.  Use oil-based dressings and spreads on foods instead of solid fats such as butter, stick margarine, or cream cheese.  Limit pre-made sauces, mixes, and "instant" products such as flavored rice, instant noodles, and  ready-made pasta.  Try more plant-protein sources, such as tofu, tempeh, black beans, edamame, lentils, nuts, and seeds.  Explore eating plans such as the Mediterranean diet or vegetarian diet. Cooking  Use oil to saut or stir-fry foods instead of solid fats such as butter, stick margarine, or lard.  Try baking, boiling, grilling, or broiling instead of frying.  Remove the fatty part of meats before cooking.  Steam vegetables in water or broth. Meal planning  At meals, imagine dividing your plate into fourths: ? One-half of your plate is fruits and vegetables. ? One-fourth of your plate is whole grains. ? One-fourth of your plate is protein, especially lean meats, poultry, eggs, tofu, beans, or nuts.  Include low-fat dairy as part of your daily diet.   Lifestyle  Choose healthy options in all settings, including home, work, school, restaurants, or stores.  Prepare your food safely: ? Wash your hands after handling raw meats. ? Keep food preparation surfaces clean by regularly washing with hot, soapy water. ? Keep raw meats separate from ready-to-eat foods, such as fruits and vegetables. ? Cook seafood, meat, poultry, and eggs to the recommended internal temperature. ? Store foods at safe temperatures. In general:  Keep cold foods at 57F (4.4C) or below.  Keep hot foods at 157F (60C) or above.  Keep your freezer at [facility] (-17.8C) or below.  Foods are no longer safe to eat when they have been between the temperatures of 40-157F (4.4-60C) for more than 2 hours. What foods should I eat? Fruits Aim to eat 2 cup-equivalents of fresh, canned (in natural juice), or frozen fruits each day. Examples  of 1 cup-equivalent of fruit include 1 small apple, 8 large strawberries, 1 cup canned fruit,  cup dried fruit, or 1 cup 100% juice. Vegetables Aim to eat 2-3 cup-equivalents of fresh and frozen vegetables each day, including different varieties and colors. Examples of 1  cup-equivalent of vegetables include 2 medium carrots, 2 cups raw, leafy greens, 1 cup chopped vegetable (raw or cooked), or 1 medium baked potato. Grains Aim to eat 6 ounce-equivalents of whole grains each day. Examples of 1 ounce-equivalent of grains include 1 slice of bread, 1 cup ready-to-eat cereal, 3 cups popcorn, or  cup cooked rice, pasta, or cereal. Meats and other proteins Aim to eat 5-6 ounce-equivalents of protein each day. Examples of 1 ounce-equivalent of protein include 1 egg, 1/2 cup nuts or seeds, or 1 tablespoon (16 g) peanut butter. A cut of meat or fish that is the size of a deck of cards is about 3-4 ounce-equivalents.  Of the protein you eat each week, try to have at least 8 ounces come from seafood. This includes salmon, trout, herring, and anchovies. Dairy Aim to eat 3 cup-equivalents of fat-free or low-fat dairy each day. Examples of 1 cup-equivalent of dairy include 1 cup (240 mL) milk, 8 ounces (250 g) yogurt, 1 ounces (44 g) natural cheese, or 1 cup (240 mL) fortified soy milk. Fats and oils  Aim for about 5 teaspoons (21 g) per day. Choose monounsaturated fats, such as canola and olive oils, avocados, peanut butter, and most nuts, or polyunsaturated fats, such as sunflower, corn, and soybean oils, walnuts, pine nuts, sesame seeds, sunflower seeds, and flaxseed. Beverages  Aim for six 8-oz glasses of water per day. Limit coffee to three to five 8-oz cups per day.  Limit caffeinated beverages that have added calories, such as soda and energy drinks.  Limit alcohol intake to no more than 1 drink a day for nonpregnant women and 2 drinks a day for men. One drink equals 12 oz of beer (355 mL), 5 oz of wine (148 mL), or 1 oz of hard liquor (44 mL). Seasoning and other foods  Avoid adding excess amounts of salt to your foods. Try flavoring foods with herbs and spices instead of salt.  Avoid adding sugar to foods.  Try using oil-based dressings, sauces, and spreads  instead of solid fats. This information is based on general [place] nutrition guidelines. For more information, visit [employer]. Exact amounts may vary based on your nutrition needs. Summary  A healthy eating plan may help you to maintain a healthy weight, reduce the risk of chronic diseases, and stay active throughout your life.  Plan your meals. Make sure you eat the right portions of a variety of nutrient-rich foods.  Try baking, boiling, grilling, or broiling instead of frying.  Choose healthy options in all settings, including home, work, school, restaurants, or stores. This information is not intended to replace advice given to you by your health care provider. Make sure you discuss any questions you have with your health care provider. Document Revised: 01/03/2018 Document Reviewed: 01/03/2018 [employer] Patient Education  [place].

## 2020-11-29 NOTE — Progress Notes (Signed)
Patient ID: ALAYJAH BOEHRINGER, female    DOB: 1956-06-18  MRN: [id number]  CC: Diabetes and Hypertension   Subjective: Jupiter Boys is a 65 y.o. female who presents for chronic ds management. Her concerns today include:  Patient with history ofHTN, DM,OSA, fibromyalgia, depression, obesity, GERD, hypothyroid, OA of multiple js (hands, knees, shoulders, hips),pituitary adenoma status post resection.   OA: Request was made earlier for manual wheelchair, and bedside commode.  [facility].  However more information was needed before her insurance would cover it.  Currently has a mobility chair.  Relies on her husband's pickup truck that has a ramp for transport of the device to use outside the home.  However spouse has developed his own medical issues and will no longer be able to transport her and the mobility chair in his truck.   -Has a walker and cane at home.  However she does not feel that these are adequate to meet her needs in getting around in her home environment or in public.  She has significant arthritis in the knees and hips and knees give out easily.  She has had a fall in the past when trying to ambulate with her walker.  At home she uses a desk chair that has wheels on it to help get around in the house to perform her ADLs. -She also has arthritis in the shoulders.  She states that if she is not able to self propel with the manual wheelchair her daughter and sister will be available to propel her in the manual wheelchair. -Requesting bedside commode because of significant difficulty in ambulating to the restroom from her bed due to arthritis in the knees and hips.  OSA:  Never got CPAP machine.  I have sent a new prescription to [facility] to get her CPAP device. Still snores loud with daytime sleepiness.  HTN: on Metoprolol, HCTZ, Losartan and Hydralazine.  Compliant with meds Limits salt in foods. No CP.  DM: Lab Results  Component Value Date    HGBA1C 6.7 (H) 02/15/2020   BS in last 2 wks have been over 200s.  Checks BID. Compliant with Metformin 500 mg BID.  Started on Trulicity by UC in [facility] 09/2020.  Trulicity has dec appetite some. Over eats at times due to depression  Depression:  Plugged into mental health at [facility] with Dr. Rolena Infante.  Elavil and Wellbutrin d/c. She left her on Cymbalta.    HM:  Had flu shot 07/2020 at [facility]. No COVID booster as yet but plans to get it.   Patient Active Problem List   Diagnosis Date Noted  . Adjustment disorder with mixed disturbance of emotions and conduct 07/28/2020  . Polyp of descending colon 02/15/2020  . Morbid obesity with BMI of 60.0-69.9, adult ([place]) 07/17/2019  . Pedal edema 07/17/2019  . Multilevel degenerative disc disease 10/31/2018  . Abdominal pain 10/28/2018  . Change in bowel function 10/28/2018  . Severe episode of recurrent major depressive disorder, with psychotic features ([place]) 05/26/2018  . Gastritis and gastroduodenitis 04/26/2018  . Atrophic vaginitis 04/01/2018  . Diabetes mellitus ([place]) 03/31/2018  . Cough, persistent 02/08/2018  . Dysphagia 12/14/2017  . 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 Urinary incontinence 07/23/2017  . Early satiety 06/16/2017  . Hx of iron deficiency anemia 05/28/2017  . Throat pain in adult 04/05/2017  . Prediabetes 02/04/2017  . Dyspnea on exertion 12/23/2016  . Chronic nonallergic rhinosinusitis with slight dust mite antigen  hypersensitivity 12/23/2016  . Hemoptysis 12/23/2016  . Fibromyalgia 08/18/2016  . Chronic lymphocytic thyroiditis 08/18/2016  . Depression 04/01/2016  . OSA (obstructive sleep apnea) 04/01/2016  . Essential hypertension 12/09/2015  . Hypothyroidism 12/09/2015  . Morbid obesity due to excess calories ([place]) 12/09/2015  . Constipation 05/27/2015  . Fatty liver 05/27/2015  . Abdominal pain, chronic, epigastric 07/04/2012  . Anxiety 06/09/2012  . History of gastroesophageal reflux (GERD)  06/09/2012  . Hyperlipidemia 06/09/2012  . Refusal of blood transfusions as patient is Jehovah's Witness 06/09/2012  . 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 Abnormal small bowel biopsy 09/18/2011  . Lactose intolerance 09/18/2011  . GERD 01/21/2010     Current Outpatient Medications on File Prior to Visit  Medication Sig Dispense Refill  . ACCU-CHEK AVIVA PLUS test strip USE AS DIRECTED 100 each 12  . ACCU-CHEK AVIVA PLUS test strip USE AS INSTRUCTED 100 strip 12  . acetaminophen (TYLENOL) 500 MG tablet Take 1,000 mg by mouth 2 (two) times daily.     Marland Kitchen albuterol (VENTOLIN HFA) 108 (90 Base) MCG/ACT inhaler Inhale 1-2 puffs into the lungs every 6 (six) hours as needed for wheezing or shortness of breath. 18 g 0  . aspirin EC 81 MG tablet Take 81 mg by mouth at bedtime.    Marland Kitchen atorvastatin (LIPITOR) 40 MG tablet Take 40 mg by mouth daily.    Marland Kitchen azelastine (ASTELIN) 0.1 % nasal spray 1-2 sprays per nostril 2 times daily as needed (Patient taking differently: Place 1 spray into both nostrils 2 (two) times daily.) 30 mL 12  . CALCIUM CARBONATE ANTACID PO Take 2 tablets by mouth See admin instructions. Take 2 tablets by mouth every morning, may also take 2 tablets at night as needed for acid reflux    . celecoxib (CELEBREX) 100 MG capsule Take 1 capsule (100 mg total) by mouth 2 (two) times daily. 60 capsule 6  . CINNAMON PO Take 1 capsule by mouth daily.    Marland Kitchen conjugated estrogens (PREMARIN) vaginal cream Place 1 Applicatorful vaginally daily as needed (vaginal dryness/itching).    . DEXILANT 60 MG capsule Take 1 capsule by mouth daily.    . diclofenac sodium (VOLTAREN) 1 % GEL Apply 2-4 g topically 4 (four) times daily as needed (pain).    . Diclofenac Sodium 3 % GEL SMARTSIG:1-2 Gram(s) Topical 3-4 Times Daily    . DULoxetine (CYMBALTA) 30 MG capsule TAKE 1 CAPSULE (30 MG TOTAL) BY MOUTH DAILY. 90 capsule 0  . esomeprazole (NEXIUM) 40 MG capsule Take 1 capsule (40 mg total) by mouth 2 (two)  times daily before a meal. 60 capsule 5  . famotidine (PEPCID) 20 MG tablet Take 1 tablet (20 mg total) by mouth 2 (two) times daily as needed for heartburn or indigestion. (Patient taking differently: Take 20 mg by mouth 2 (two) times daily.) 60 tablet 3  . fluticasone (FLONASE) 50 MCG/ACT nasal spray Place 1 spray into both nostrils daily. (Patient taking differently: Place 1 spray into both nostrils daily as needed for allergies or rhinitis.) 16 g 1  . fluticasone (FLOVENT HFA) 220 MCG/ACT inhaler 2 inhalations twice daily (Patient taking differently: Inhale 2 puffs into the lungs 2 (two) times daily as needed (shortness of breath/wheezing).) 1 each 12  . furosemide (LASIX) 40 MG tablet TAKE 1/2 TO 1 TABLET BY MOUTH DAILY FOR LOWER EXTREMITY SWELLING (Patient taking differently: Take 20 mg by mouth daily.) 90 tablet 2  . gabapentin (NEURONTIN) 300 MG capsule Take 2 capsules (  600 mg total) by mouth 2 (two) times daily. 60 capsule 0  . glucose blood test strip USE AS INSTRUCTED    . glucose blood test strip USE AS INSTRUCTED    . hydrALAZINE (APRESOLINE) 50 MG tablet Take 1 tablet (50 mg total) by mouth 3 (three) times daily. (Patient taking differently: Take 50 mg by mouth 2 (two) times daily.) 270 tablet 1  . hydrochlorothiazide (HYDRODIURIL) 25 MG tablet Take 25 mg by mouth daily.    Marland Kitchen levothyroxine (SYNTHROID) 88 MCG tablet Take 1 tablet (88 mcg total) by mouth daily. 30 tablet 2  . LINZESS 290 MCG CAPS capsule TAKE 1 CAPSULE BY MOUTH DAILY BEFORE BREAKFAST. 90 capsule 3  . losartan (COZAAR) 25 MG tablet Take 25 mg by mouth daily.    . metFORMIN (GLUCOPHAGE) 1000 MG tablet Take 1,000 mg by mouth 2 (two) times daily.    . Metoprolol Tartrate 75 MG TABS TAKE 1 TABLET BY MOUTH TWICE DAILY. (Patient taking differently: Take 75 mg by mouth 2 (two) times daily.) 180 tablet 2  . montelukast (SINGULAIR) 10 MG tablet TAKE 1 TABLET BY MOUTH AT BEDTIME. 90 tablet 2  . Multiple Vitamin (MULTIVITAMIN WITH  MINERALS) TABS tablet Take 1 tablet by mouth daily.     Marland Kitchen NARCAN 4 MG/0.1ML LIQD nasal spray kit SMARTSIG:1 Spray(s) Both Nares Once PRN    . Omega-3 Fatty Acids (FISH OIL) 1000 MG CAPS Take 1,000 mg by mouth at bedtime.     . ondansetron (ZOFRAN) 4 MG tablet Take 4 mg by mouth every 8 (eight) hours as needed for nausea or vomiting.     Marland Kitchen OVER THE COUNTER MEDICATION Take 1 capsule by mouth daily. BEET ROOT    . pregabalin (LYRICA) 25 MG capsule Take 25 mg by mouth 2 (two) times daily.     Marland Kitchen spironolactone (ALDACTONE) 25 MG tablet Take 25 mg by mouth daily.    Marland Kitchen tiZANidine (ZANAFLEX) 4 MG tablet Take 4 mg by mouth 2 (two) times daily as needed for muscle spasms.    . TRULICITY 1.63 WG/6.6ZL SOPN Inject 0.75 mg into the skin once a week.     . valACYclovir (VALTREX) 500 MG tablet Take 500 mg by mouth 2 (two) times daily.    . vitamin B-12 (CYANOCOBALAMIN) 1000 MCG tablet Take 1,000 mcg by mouth daily.    . Vitamin D, Ergocalciferol, (DRISDOL) 1.25 MG (50000 UNIT) CAPS capsule Take 50,000 Units by mouth once a week.     No current facility-administered medications on file prior to visit.    Allergies  Allergen Reactions  . Celexa [Citalopram Hydrobromide] Other (See Comments)    "Made me feel out of it"  . Gabapentin Other (See Comments)    Contributed to lower extremity edema  . Norvasc [Amlodipine Besylate] Other (See Comments)    Edema in lower extremities  . Diflucan [Fluconazole] Rash    Social History   Socioeconomic History  . Marital status: Married    Spouse name: Not on file  . Number of children: 4  . Years of education: 10  . Highest education level: Not on file  Occupational History    Employer: UNEMPLOYED  Tobacco Use  . Smoking status: Former Smoker    Packs/day: 0.50    Years: 10.00    Pack years: 5.00    Types: Cigarettes    Start date: 07/14/1975    Quit date: 04/04/1993    Years since quitting: 27.6  . Smokeless tobacco: Never  Used  . Tobacco comment: quit  20 yrs ago  Vaping Use  . Vaping Use: Never used  Substance and Sexual Activity  . Alcohol use: No    Alcohol/week: 0.0 standard drinks  . Drug use: No  . Sexual activity: Yes  Other Topics Concern  . Not on file  Social History Narrative   Lives with husband in an apartment on the first floor.  Has 4 children.     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  Trying to get disability.  Formerly worked as a Teacher, early years/pre.  Education: 11th grade.      [facility] (12/23/16):   Originally from [facility] [place] [facility]. She was raised in [place]. 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 No pets currently. No bird exposure. She does have mold in her current home in the bathroom, laundry room, & master bedroom.    Social Determinants of Health   Financial Resource Strain: Low Risk   . Difficulty of Paying Living Expenses: Not very hard  Food Insecurity: No Food Insecurity  . Worried About Charity fundraiser in the Last Year: Never true  . Ran Out of Food in the Last Year: Never true  Transportation Needs: Unmet Transportation Needs  . Lack of Transportation (Medical): Yes  . Lack of Transportation (Non-Medical): No  Physical Activity: Inactive  . Days of Exercise per Week: 0 days  . Minutes of Exercise per Session: 0 min  Stress: Stress Concern Present  . Feeling of Stress : To some extent  Social Connections: Socially Integrated  . Frequency of Communication with Friends and Family: More than three times a week  . Frequency of Social Gatherings with Friends and Family: More than three times a week  . Attends Religious Services: More than 4 times per year  . Active Member of Clubs or Organizations: Yes  . Attends Archivist Meetings: More than 4 times per year  . Marital Status: Married  Human resources officer Violence: Not At Risk  . Fear  of Current or Ex-Partner: No  . Emotionally Abused: No  . Physically Abused: No  . Sexually Abused: No    Family History  Problem Relation Age of Onset  . Kidney cancer Father   . Hypertension Father   . Hyperlipidemia Father   . Sleep apnea Father   . Diabetes Mother   . Hypertension Mother   . Heart Problems Mother   . Hyperlipidemia Mother   . Asthma Mother   . Sleep apnea Mother   . Liver disease Brother   . Arthritis Brother   . Hypertension Sister   . Allergic rhinitis Sister   . Stomach cancer Other        aunt  . Breast cancer Maternal Grandmother   . Kidney disease Maternal Grandfather   . Breast cancer Paternal Grandmother   . Hypertension Brother   . Hyperlipidemia Brother   . Hypertension Brother   . Hypertension Sister   . Hyperlipidemia Sister   . Lupus Maternal Aunt   . Eczema Daughter   . Colon cancer Neg Hx   . Immunodeficiency Neg Hx   . Urticaria Neg Hx     Past Surgical History:  Procedure Laterality Date  . ABDOMINAL HYSTERECTOMY    . ABDOMINAL HYSTERECTOMY  02/18/2000  .  CARDIAC CATHETERIZATION  08/18/2003   normal L main/LAD/L Cfx/RCA (Dr. Adora Fridge)  . COLONOSCOPY  2006   Dr. [name], hyperplastic polyps  . COLONOSCOPY  08/06/11   abnormal terminal ileum for 10cm, erosions, geographical ulceration. Bx small bowel mucosa with prominent intramucosal lymphoid aggregates, slightly inflammed  . COLONOSCOPY WITH PROPOFOL N/A 12/28/2019   Rourk: 4 sessile serrated adenomas removed on the colon.  Next colonoscopy in 3 years.  Marland Kitchen DIRECT LARYNGOSCOPY  03/15/2012   Procedure: DIRECT LARYNGOSCOPY;  Surgeon: [name], MD;  Location: [place];  Service: ENT;  Laterality: N/A; Dr. Wolicki--:>no foreign body seen. normal esophagus to 40cm  . ESOPHAGEAL DILATION  04/17/2015   Procedure: ESOPHAGEAL DILATION;  Surgeon: [name], MD;  [facility];  Service: Endoscopy;;  . ESOPHAGOGASTRODUODENOSCOPY  08/23/2008   UXL:KGMW distal esophageal  erosions consistent with mild erosive reflux esophagitis, otherwise unremarkable esophagus/ Tiny antral erosions of doubtful clinical significance, otherwise normal stomach, patent pylorus, normal D1 and D2  . ESOPHAGOGASTRODUODENOSCOPY  08/06/11   small hh, noncritical Schatzki's ring s/p 67 F  . ESOPHAGOGASTRODUODENOSCOPY N/A 04/17/2015   NUU:[id number] s/p dilation  . ESOPHAGOGASTRODUODENOSCOPY (EGD) WITH PROPOFOL N/A 01/20/2018   Dr. [name]: Erosive gastropathy, normal-appearing esophagus status post empiric dilation.  Chronic gastritis, no H. pylori.  . ESOPHAGOSCOPY  03/15/2012   Procedure: ESOPHAGOSCOPY;  Surgeon: [name], MD;  Location: [place];  Service: ENT;  Laterality: N/A;  . KNEE ARTHROSCOPY  04/27/2001  . MALONEY DILATION N/A 01/20/2018   Procedure: Venia Minks DILATION;  Surgeon: [name], MD;  [facility];  Service: Endoscopy;  Laterality: N/A;  . [place]  2003   negative bruce protocol exercise tress test; EF 68%; intermediate risk study due to evidence of anterior wall ischemia extending from mid-ventricle to apex  . PITUITARY SURGERY  06/2012   benign tumor, surgeon [facility]  . POLYPECTOMY  12/28/2019   Procedure: POLYPECTOMY;  Surgeon: [name], MD;  [facility];  Service: Endoscopy;;  . RECTOCELE REPAIR    . TRANSTHORACIC ECHOCARDIOGRAM  2003   EF normal  . VAGINAL PROLAPSE REPAIR    . VIDEO BRONCHOSCOPY Bilateral 03/08/2017   Procedure: VIDEO BRONCHOSCOPY WITHOUT FLUORO;  Surgeon: Javier Glazier, MD;  Location: [place];  Service: Cardiopulmonary;  Laterality: Bilateral;    ROS: Review of Systems Negative except as stated above  PHYSICAL EXAM: BP 98/68   Pulse 76   Resp 16   Ht $R'5\' 8"'ut$  (1.727 m)   Wt (!) 346 lb 3.2 oz (157 kg)   SpO2 96%   BMI 52.64 kg/m   Wt Readings from Last 3 Encounters:  11/29/20 (!) 346 lb 3.2 oz (157 kg)  07/27/20 (!) 343 lb (155.6 kg)  06/17/20 (!) 350 lb (158.8 kg)     Physical Exam  General appearance - alert, well appearing, morbidly obese older African-American female and in no distress.  Patient is in a mobility chair. Mental status - normal mood, behavior, speech, dress, motor activity, and thought processes Eyes - pupils equal and reactive, extraocular eye movements intact Neck - supple, no significant adenopathy Chest - clear to auscultation, no wheezes, rales or rhonchi, symmetric air entry Heart - normal rate, regular rhythm, normal S1, S2, no murmurs, rubs, clicks or gallops Musculoskeletal -she has significant bowing deformity of both knees.  She walks with a stooped posture and is able to ambulate only 5 feet from the mobility chair before  having to turn around and get back in it because of pain in the knees and feeling that she will fall.  She has significant crepitus on passive range of motion of both knees. Grip 5/5 bilaterally.  Power proximally and distally in the upper extremity 5/5. Extremities -no lower extremity edema.  A1C 6.7 POC  CMP Latest Ref Rng & Units 07/27/2020 12/26/2019 10/25/2019  Glucose 70 - 99 mg/dL 176(H) 165(H) -  BUN 8 - 23 mg/dL 18 16 -  Creatinine 0.44 - 1.00 mg/dL 0.91 0.76 -  Sodium 135 - 145 mmol/L 139 137 -  Potassium 3.5 - 5.1 mmol/L 3.6 4.3 -  Chloride 98 - 111 mmol/L 98 101 -  CO2 22 - 32 mmol/L 28 28 -  Calcium 8.9 - 10.3 mg/dL 10.4(H) 9.2 -  Total Protein 6.5 - 8.1 g/dL 7.8 - 7.2  Total Bilirubin 0.3 - 1.2 mg/dL 1.1 - 0.4  Alkaline Phos 38 - 126 U/L 71 - 88  AST 15 - 41 U/L 45(H) - 21  ALT 0 - 44 U/L 56(H) - 25   Lipid Panel     Component Value Date/Time   CHOL 141 10/25/2019 1038   TRIG 58 10/25/2019 1038   HDL 58 10/25/2019 1038   CHOLHDL 2.4 10/25/2019 1038   CHOLHDL 2.3 12/16/2015 0950   VLDL 13 12/16/2015 0950   LDLCALC 71 10/25/2019 1038    CBC    Component Value Date/Time   WBC 10.3 07/27/2020 1240   RBC 4.35 07/27/2020 1240   HGB 12.8 07/27/2020 1240   HGB 12.0 01/20/2017  1001   HCT 40.9 07/27/2020 1240   HCT 38.2 01/20/2017 1001   HCT 41 06/25/2011 1053   PLT 354 07/27/2020 1240   PLT 356 01/20/2017 1001   MCV 94.0 07/27/2020 1240   MCV 90 01/20/2017 1001   MCV 91.1 06/25/2011 1053   MCH 29.4 07/27/2020 1240   MCHC 31.3 07/27/2020 1240   RDW 12.5 07/27/2020 1240   RDW 13.1 01/20/2017 1001   LYMPHSABS 3.2 01/15/2020 1101   LYMPHSABS 3.4 (H) 01/20/2017 1001   MONOABS 1.1 (H) 01/15/2020 1101   EOSABS 0.1 01/15/2020 1101   EOSABS 0.1 01/20/2017 1001   BASOSABS 0.1 01/15/2020 1101   BASOSABS 0.0 01/20/2017 1001    ASSESSMENT AND PLAN: 1. Type 2 diabetes mellitus with morbid obesity (HCC) A1c today is at goal.  It is 6.7.  However patient reports that blood sugars have been elevated over the past 2 weeks.  She will continue Trulicity.  I recommend increasing the Metformin to 1000 mg in the morning and continuing with 500 mg in the evening.  Encourage healthy eating habits. - metFORMIN (GLUCOPHAGE) 500 MG tablet; 2 tabs PO Q a.m and 1 tab in evenings  Dispense: 90 tablet; Refill: 5  2. Other osteoarthritis involving multiple joints Gait Disturbance Patient with significant osteoarthritis of the knees and hips.  This has limited her mobility to get around in her home environment and also in public.  Kasandra Knudsen is not adequate given her weight and does not provide significant stability.  Gilford Rile is not adequate to resolve the issues again because of her weight and she has had falls with the walker.  The patient demonstrates sufficient strength to self propel in a manual wheelchair.  If she is unable to do so or gets tired in doing so, her daughter or sister will be able to propel her.  Patient can ambulate 5-10 feet unassisted before having to sit  down..  3. OSA (obstructive sleep apnea) Working on getting CPAP machine through [facility].  4. Essential hypertension At goal continue current medications.  5. Severe major depression without psychotic features  ([place]) Plugged into mental health services.     Patient was given the opportunity to ask questions.  Patient verbalized understanding of the plan and was able to repeat key elements of the plan.   No orders of the defined types were placed in this encounter.    Requested Prescriptions   Signed Prescriptions Disp Refills  . metFORMIN (GLUCOPHAGE) 500 MG tablet 90 tablet 5    Sig: 2 tabs PO Q a.m and 1 tab in evenings    Return in about 3 months (around 02/26/2021).  Karle Plumber, MD, FACP

## 2020-12-02 ENCOUNTER — Ambulatory Visit: Payer: Medicaid Other | Admitting: Physical Therapy

## 2020-12-02 LAB — POCT GLYCOSYLATED HEMOGLOBIN (HGB A1C): HbA1c, POC (controlled diabetic range): 6.7 % (ref 0.0–7.0)

## 2020-12-02 NOTE — Addendum Note (Signed)
Addended by: Boykin Reaper R on: 12/02/2020 12:00 PM   Modules accepted: Orders

## 2020-12-04 ENCOUNTER — Other Ambulatory Visit: Payer: Self-pay | Admitting: Internal Medicine

## 2020-12-04 DIAGNOSIS — Z1231 Encounter for screening mammogram for malignant neoplasm of breast: Secondary | ICD-10-CM

## 2020-12-06 ENCOUNTER — Ambulatory Visit: Payer: Medicaid Other | Admitting: Physical Therapy

## 2020-12-09 ENCOUNTER — Ambulatory Visit: Payer: Medicaid Other | Attending: Internal Medicine | Admitting: Physical Therapy

## 2020-12-09 ENCOUNTER — Other Ambulatory Visit: Payer: Self-pay

## 2020-12-09 ENCOUNTER — Encounter: Payer: Self-pay | Admitting: Physical Therapy

## 2020-12-09 DIAGNOSIS — M25651 Stiffness of right hip, not elsewhere classified: Secondary | ICD-10-CM | POA: Diagnosis present

## 2020-12-09 DIAGNOSIS — R278 Other lack of coordination: Secondary | ICD-10-CM | POA: Insufficient documentation

## 2020-12-09 DIAGNOSIS — R262 Difficulty in walking, not elsewhere classified: Secondary | ICD-10-CM | POA: Insufficient documentation

## 2020-12-09 DIAGNOSIS — M25652 Stiffness of left hip, not elsewhere classified: Secondary | ICD-10-CM | POA: Diagnosis present

## 2020-12-09 DIAGNOSIS — R2681 Unsteadiness on feet: Secondary | ICD-10-CM | POA: Insufficient documentation

## 2020-12-09 DIAGNOSIS — M6281 Muscle weakness (generalized): Secondary | ICD-10-CM | POA: Insufficient documentation

## 2020-12-09 NOTE — Therapy (Signed)
[facility] [address] [place], [place], [address] Phone: [phone]   Fax:  [phone]  Physical Therapy Treatment  Patient Details  Name: Robin [name] MRN: [id number] Date of Birth: June 12, 1956 Referring Provider (PT): Karle Plumber, MD   Encounter Date: 12/09/2020   PT End of Session - 12/09/20 0933    Visit Number 2    Date for PT Re-Evaluation 01/09/21    Authorization Type Medicaid: submitted authorization for visits    PT Start Time 0933    PT Stop Time 1015    PT Time Calculation (min) 42 min    Activity Tolerance Patient tolerated treatment well    Behavior During Therapy [facility] for tasks assessed/performed           Past Medical History:  Diagnosis Date  . Anxiety disorder   . Arthritis   . Asthma   . Chronic neck pain   . Constipation   . Depression   . Diabetes mellitus without complication ([place])   . Dyspnea   . GERD (gastroesophageal reflux disease)   . Hiatal hernia    small  . Hyperlipidemia   . Hypertension   . Hypothyroidism   . Morbid obesity with BMI of 50.0-59.9, adult ([place])   . Schatzki's ring    non critical  . Sinusitis   . Sleep apnea    CPAP, Sleep study at [place]  . Sleep apnea     Past Surgical History:  Procedure Laterality Date  . ABDOMINAL HYSTERECTOMY    . ABDOMINAL HYSTERECTOMY  02/18/2000  . CARDIAC CATHETERIZATION  08/18/2003   normal L main/LAD/L Cfx/RCA (Dr. Adora Fridge)  . COLONOSCOPY  2006   Dr. [name], hyperplastic polyps  . COLONOSCOPY  08/06/11   abnormal terminal ileum for 10cm, erosions, geographical ulceration. Bx small bowel mucosa with prominent intramucosal lymphoid aggregates, slightly inflammed  . COLONOSCOPY WITH PROPOFOL N/A 12/28/2019   Rourk: 4 sessile serrated adenomas removed on the colon.  Next colonoscopy in 3 years.  Marland Kitchen DIRECT LARYNGOSCOPY  03/15/2012   Procedure: DIRECT LARYNGOSCOPY;  Surgeon: [name], MD;   Location: [place];  Service: ENT;  Laterality: N/A; Dr. Wolicki--:>no foreign body seen. normal esophagus to 40cm  . ESOPHAGEAL DILATION  04/17/2015   Procedure: ESOPHAGEAL DILATION;  Surgeon: [name], MD;  [facility];  Service: Endoscopy;;  . ESOPHAGOGASTRODUODENOSCOPY  08/23/2008   OIN:OMVE distal esophageal erosions consistent with mild erosive reflux esophagitis, otherwise unremarkable esophagus/ Tiny antral erosions of doubtful clinical significance, otherwise normal stomach, patent pylorus, normal D1 and D2  . ESOPHAGOGASTRODUODENOSCOPY  08/06/11   small hh, noncritical Schatzki's ring s/p 24 F  . ESOPHAGOGASTRODUODENOSCOPY N/A 04/17/2015   [facility]:NOBSJG s/p dilation  . ESOPHAGOGASTRODUODENOSCOPY (EGD) WITH PROPOFOL N/A 01/20/2018   Dr. [name]: Erosive gastropathy, normal-appearing esophagus status post empiric dilation.  Chronic gastritis, no H. pylori.  . ESOPHAGOSCOPY  03/15/2012   Procedure: ESOPHAGOSCOPY;  Surgeon: [name], MD;  Location: [place];  Service: ENT;  Laterality: N/A;  . KNEE ARTHROSCOPY  04/27/2001  . MALONEY DILATION N/A 01/20/2018   Procedure: Venia Minks DILATION;  Surgeon: [name], MD;  [facility];  Service: Endoscopy;  Laterality: N/A;  . [place]  2003   negative bruce protocol exercise tress test; EF 68%; intermediate risk study due to evidence of anterior wall ischemia extending from mid-ventricle to apex  . PITUITARY SURGERY  06/2012  benign tumor, surgeon at [facility]  . POLYPECTOMY  12/28/2019   Procedure: POLYPECTOMY;  Surgeon: [name], MD;  [facility];  Service: Endoscopy;;  . RECTOCELE REPAIR    . TRANSTHORACIC ECHOCARDIOGRAM  2003   EF normal  . VAGINAL PROLAPSE REPAIR    . VIDEO BRONCHOSCOPY Bilateral 03/08/2017   Procedure: VIDEO BRONCHOSCOPY WITHOUT FLUORO;  Surgeon: Javier Glazier, MD;  Location: [place];  Service: Cardiopulmonary;  Laterality: Bilateral;    There were no  vitals filed for this visit.   Subjective Assessment - 12/09/20 0936    Subjective I am just aching all over 'from my head to my toes."    Pertinent History use of motorized W/C due to weakness and LE pain.    Currently in Pain? Yes   All over   Pain Score 8     Pain Descriptors / Indicators Aching    Aggravating Factors  standing an dwalking    Pain Relieving Factors meds    Multiple Pain Sites No              [facility] PT Assessment - 12/09/20 0001      Assessment   Medical Diagnosis physical deconditioning    Referring Provider (PT) Karle Plumber, MD      Posture/Postural Control   Posture/Postural Control Postural limitations    Postural Limitations Forward head;Rounded Shoulders;Flexed trunk    Posture Comments bil knee flexion in standing      Strength   Overall Strength Comments bil hips in sitting 4/5, knees 4/5, ankle DF 4-/5      Transfers   Comments pt safely transfers from wheelchair to chair with stand by assistance      Ambulation/Gait   Gait Comments RW 21 feet 4x today with SBA                         [facility] Adult PT Treatment/Exercise - 12/09/20 0001      Knee/Hip Exercises: Standing   Gait Training 21 ft with RW x4 with SBA      Knee/Hip Exercises: Seated   Ball Squeeze 10x    Other Seated Knee/Hip Exercises Ankle DF/PF 15x                    PT Short Term Goals - 12/09/20 0938      PT SHORT TERM GOAL #1   Title independent with initial HEP    Time 4    Period Weeks    Status Achieved    Target Date 12/12/20      PT SHORT TERM GOAL #2   Title walk with rolling walker x 30 feet and demonstrate moderate shortness of breath    Period Weeks    Status On-going   Walked 21 ft with RW 4x today   Target Date 12/12/20      PT SHORT TERM GOAL #3   Title initiate a walking program at home to improve ambulation and begin to wean from wheelchair use at home    Time 4    Period Weeks    Status Partially Met   Currently  walking with RW living room to kitchen on the regular   Target Date 12/12/20             PT Long Term Goals - 11/14/20 1024      PT LONG TERM GOAL #1   Title Pt will be independent with progression of  final HEP    Baseline --    Time 8    Period Weeks    Status New    Target Date 01/09/21      PT LONG TERM GOAL #2   Title improve strength to wean from wheelchair use > or = to 30% of the time for home distances    Baseline using W/C > 90% of the time    Time 8    Period Weeks    Status New    Target Date 01/09/21      PT LONG TERM GOAL #3   Title improve LE flexibility to stand erect with standing x 1 minute    Baseline flexed knees, hip and trunk    Time 8    Period Weeks    Status New    Target Date 01/09/21      PT LONG TERM GOAL #4   Title improve strength and endurance to walk 50-75 feet with rolling walker with moderate shortness of breath    Baseline 25 feet with shortness of breath that required use of inhaler    Time 8    Period Weeks    Status New    Target Date 01/09/21      PT LONG TERM GOAL #5   Title perform standing exercise for 3-5 minutes in the clinic to improve endurance and independence with meal prep and self care tasks    Baseline 30 seconds    Time 8    Period Weeks    Status New    Target Date 01/09/21                 Plan - 12/09/20 0934    Clinical Impression Statement Pt with first return visit since evaluation. Pt has had to cancel appts due to her husbands chemo treatments being rescheduled as other complications they are dealing with regarding his cancer. She is walking frequently from her living room to kitchen with her RW. today she walked 21 ft 4x with RW and only SBA. Pt has not been able to attend her aquatic visits. pt is compliant with her initial HEP.    Personal Factors and Comorbidities Comorbidity 3+    Comorbidities obesity, W/C x 1 year, fibromyalgia, depression    Examination-Activity Limitations  Stand;Stairs;Squat;Locomotion Level;Transfers;Dressing;Hygiene/Grooming    Examination-Participation Restrictions Cleaning;Community Activity;Meal Prep    Stability/Clinical Decision Making Evolving/Moderate complexity    Rehab Potential Good    PT Frequency 2x / week    PT Duration 8 weeks    PT Treatment/Interventions ADLs/Self Care Home Management;Cryotherapy;Moist Heat;Neuromuscular re-education;Therapeutic exercise;Therapeutic activities;Functional mobility training;Stair training;Gait training;Patient/family education;Wheelchair mobility training;Manual techniques;Energy conservation;Passive range of motion;Taping;Aquatic Therapy    PT Next Visit Plan work on transitions, hip and knee flexibility, gait with rolling walker.  Aquatic PT when available    PT Home Exercise Plan Access Code: [email or web address]    Consulted and Agree with Plan of Care Patient           Patient will benefit from skilled therapeutic intervention in order to improve the following deficits and impairments:  Abnormal gait,Decreased activity tolerance,Decreased balance,Decreased strength,Postural dysfunction,Impaired flexibility,Hypomobility,Pain,Decreased endurance,Difficulty walking,Decreased range of motion  Visit Diagnosis: Muscle weakness (generalized)  Difficulty in walking, not elsewhere classified  Stiffness of left hip, not elsewhere classified  Stiffness of right hip, not elsewhere classified  Other lack of coordination  Unsteadiness on feet     Problem List Patient Active Problem List   Diagnosis Date Noted  . Adjustment  disorder with mixed disturbance of emotions and conduct 07/28/2020  . Polyp of descending colon 02/15/2020  . Morbid obesity with BMI of 60.0-69.9, adult ([place]) 07/17/2019  . Pedal edema 07/17/2019  . Multilevel degenerative disc disease 10/31/2018  . Abdominal pain 10/28/2018  . Change in bowel function 10/28/2018  . Severe episode of recurrent major depressive disorder, with  psychotic features ([place]) 05/26/2018  . Gastritis and gastroduodenitis 04/26/2018  . Atrophic vaginitis 04/01/2018  . Diabetes mellitus ([place]) 03/31/2018  . Cough, persistent 02/08/2018  . Dysphagia 12/14/2017  . 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 Urinary incontinence 07/23/2017  . Early satiety 06/16/2017  . Hx of iron deficiency anemia 05/28/2017  . Throat pain in adult 04/05/2017  . Prediabetes 02/04/2017  . Dyspnea on exertion 12/23/2016  . 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 Hemoptysis 12/23/2016  . Fibromyalgia 08/18/2016  . Chronic lymphocytic thyroiditis 08/18/2016  . Depression 04/01/2016  . OSA (obstructive sleep apnea) 04/01/2016  . Essential hypertension 12/09/2015  . Hypothyroidism 12/09/2015  . Morbid obesity due to excess calories ([place]) 12/09/2015  . Constipation 05/27/2015  . Fatty liver 05/27/2015  . Abdominal pain, chronic, epigastric 07/04/2012  . Anxiety 06/09/2012  . History of gastroesophageal reflux (GERD) 06/09/2012  . Hyperlipidemia 06/09/2012  . Refusal of blood transfusions as patient is Jehovah's Witness 06/09/2012  . 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 Abnormal small bowel biopsy 09/18/2011  . Lactose intolerance 09/18/2011  . GERD 01/21/2010   Myrene Galas, PTA 12/09/20 10:18 AM  12/09/2020, 10:18 AM  [facility] [address] [place], [place], [address] Phone: [phone]   Fax:  [phone]  Name: Robin [name] MRN: [id number] Date of Birth: [birth year]

## 2020-12-12 ENCOUNTER — Other Ambulatory Visit: Payer: Self-pay | Admitting: *Deleted

## 2020-12-12 ENCOUNTER — Telehealth: Payer: Self-pay | Admitting: Internal Medicine

## 2020-12-12 ENCOUNTER — Other Ambulatory Visit: Payer: Self-pay

## 2020-12-12 DIAGNOSIS — E1169 Type 2 diabetes mellitus with other specified complication: Secondary | ICD-10-CM

## 2020-12-12 NOTE — Telephone Encounter (Signed)
Referral place for DM education as requested by [name], RN Care coordinator.with [facility].

## 2020-12-12 NOTE — Patient Outreach (Signed)
Medicaid Managed Care   Nurse Care Manager Note  12/12/2020 Name:  Robin [name] MRN:  [id number] DOB:  22-Jan-1956  Robin [name] is an 65 y.o. year old female who is a primary patient of Ladell Pier, MD.  The [facility] Managed Care Coordination team was consulted for assistance with:    HTN DMII  Robin [name] was given information about Medicaid Managed Care Coordination team services today. Robin [name] agreed to services and verbal consent obtained.  Engaged with patient by telephone for follow up visit in response to provider referral for case management and/or care coordination services.   Assessments/Interventions:  Review of past medical history, allergies, medications, health status, including review of consultants reports, laboratory and other test data, was performed as part of comprehensive evaluation and provision of chronic care management services.  SDOH (Social Determinants of Health) assessments and interventions performed:   Care Plan  Allergies  Allergen Reactions  . Celexa [Citalopram Hydrobromide] Other (See Comments)    "Made me feel out of it"  . Gabapentin Other (See Comments)    Contributed to lower extremity edema  . Norvasc [Amlodipine Besylate] Other (See Comments)    Edema in lower extremities  . Diflucan [Fluconazole] Rash    Medications Reviewed Today    Reviewed by Melissa Montane, RN (Registered Nurse) on 12/12/20 at 1330  Med List Status: <None>  Medication Order Taking? Sig Documenting Provider Last Dose Status Informant  ACCU-CHEK AVIVA PLUS test strip [id number] Yes USE AS DIRECTED Ladell Pier, MD Taking Active Multiple Informants  ACCU-CHEK AVIVA PLUS test strip [id number] Yes USE AS INSTRUCTED Ladell Pier, MD Taking Active Multiple Informants  acetaminophen (TYLENOL) 500 MG tablet [id number] Yes Take 1,000 mg by mouth 2 (two) times daily.  [provider] Taking Active Multiple Informants  albuterol (VENTOLIN HFA)  108 (90 Base) MCG/ACT inhaler [id number] Yes Inhale 1-2 puffs into the lungs every 6 (six) hours as needed for wheezing or shortness of breath. Loura Halt A, NP Taking Active Multiple Informants  aspirin EC 81 MG tablet [id number] Yes Take 81 mg by mouth at bedtime. [provider] Taking Active Multiple Informants  atorvastatin (LIPITOR) 40 MG tablet [id number] Yes Take 40 mg by mouth daily. [provider] Taking Active Multiple Informants  azelastine (ASTELIN) 0.1 % nasal spray [id number] Yes 1-2 sprays per nostril 2 times daily as needed  Patient taking differently: Place 1 spray into both nostrils 2 (two) times daily.   Bobbitt, Sedalia Muta, MD Taking Active   CALCIUM CARBONATE ANTACID PO [id number]  Take 2 tablets by mouth See admin instructions. Take 2 tablets by mouth every morning, may also take 2 tablets at night as needed for acid reflux [provider]  Active Multiple Informants           Med Note Lane Hacker   Tue Sep 10, 2020  2:30 PM) Tapering off  celecoxib (CELEBREX) 100 MG capsule [id number] Yes Take 1 capsule (100 mg total) by mouth 2 (two) times daily. Ladell Pier, MD Taking Active Multiple Informants  CINNAMON PO [id number] Yes Take 1 capsule by mouth daily. [provider] Taking Active Multiple Informants  conjugated estrogens (PREMARIN) vaginal cream [id number] Yes Place 1 Applicatorful vaginally daily as needed (vaginal dryness/itching). [provider] Taking Active Multiple Informants  DEXILANT 60 MG capsule [id number] No Take 1 capsule by mouth daily.  Patient not taking: Reported on 12/12/2020   [provider] Not Taking Active  diclofenac sodium (VOLTAREN) 1 % GEL [id number] Yes Apply 2-4 g topically 4 (four) times daily as needed (pain). [provider] Taking Active Multiple Informants           Med Note (Kinya Meine A   Tue Sep 24, 2020 11:12 AM)    Diclofenac Sodium 3 % GEL [id number] Yes  SMARTSIG:1-2 Gram(s) Topical 3-4 Times Daily [provider] Taking Active   DULoxetine (CYMBALTA) 30 MG capsule [id number] Yes TAKE 1 CAPSULE (30 MG TOTAL) BY MOUTH DAILY. Ladell Pier, MD Taking Active   esomeprazole (NEXIUM) 40 MG capsule [id number] Yes Take 1 capsule (40 mg total) by mouth 2 (two) times daily before a meal. Mahala Menghini, PA-C Taking Active Multiple Informants  famotidine (PEPCID) 20 MG tablet [id number] Yes Take 1 tablet (20 mg total) by mouth 2 (two) times daily as needed for heartburn or indigestion.  Patient taking differently: Take 20 mg by mouth 2 (two) times daily.   Mahala Menghini, PA-C Taking Active   fluticasone (FLONASE) 50 MCG/ACT nasal spray [id number] Yes Place 1 spray into both nostrils daily.  Patient taking differently: Place 1 spray into both nostrils daily as needed for allergies or rhinitis.   Orvan July, NP Taking Active   fluticasone (FLOVENT HFA) 220 MCG/ACT inhaler [id number] Yes 2 inhalations twice daily  Patient taking differently: Inhale 2 puffs into the lungs 2 (two) times daily as needed (shortness of breath/wheezing).   Bobbitt, Sedalia Muta, MD Taking Active            Med Note Payton Doughty   OHY Jul 27, 2020  4:36 PM) Pt states that she only took once - when she could not find her albuterol  furosemide (LASIX) 40 MG tablet [id number] Yes TAKE 1/2 TO 1 TABLET BY MOUTH DAILY FOR LOWER EXTREMITY SWELLING  Patient taking differently: Take 20 mg by mouth daily.   Ladell Pier, MD Taking Active   gabapentin (NEURONTIN) 300 MG capsule [id number] No Take 2 capsules (600 mg total) by mouth 2 (two) times daily.  Patient not taking: Reported on 12/12/2020   Tanda Rockers, MD Not Taking Active            Med Note Elizbeth Squires Sep 10, 2020  2:32 PM) Told to stop due to interaction with Lyrica  glucose blood test strip [id number]  USE AS INSTRUCTED [provider]  Active Multiple Informants  glucose blood  test strip [id number] Yes USE AS INSTRUCTED [provider] Taking Active   hydrALAZINE (APRESOLINE) 50 MG tablet [id number] Yes Take 1 tablet (50 mg total) by mouth 3 (three) times daily.  Patient taking differently: Take 50 mg by mouth 2 (two) times daily.   Erlene Quan, PA-C Taking Active            Med Note Lane Hacker   Tue Sep 10, 2020  2:23 PM) Confirmed she's taking BID  hydrochlorothiazide (HYDRODIURIL) 25 MG tablet [id number] Yes Take 25 mg by mouth daily. [provider] Taking Active Multiple Informants           Med Note Lane Hacker   Tue Sep 10, 2020  2:24 PM)    levothyroxine (SYNTHROID) 88 MCG tablet [id number] Yes Take 1 tablet (88 mcg total) by mouth daily. Ladell Pier, MD Taking Active   LINZESS 290 MCG CAPS capsule [id number] Yes TAKE 1 CAPSULE BY MOUTH DAILY BEFORE BREAKFAST. Annitta Needs, NP  Taking Active   losartan (COZAAR) 25 MG tablet [id number] Yes Take 25 mg by mouth daily. [provider] Taking Active Multiple Informants  metFORMIN (GLUCOPHAGE) 1000 MG tablet [id number] No Take 1,000 mg by mouth 2 (two) times daily.  Patient not taking: Reported on 12/12/2020   [provider] Not Taking Active Multiple Informants  metFORMIN (GLUCOPHAGE) 500 MG tablet [id number] Yes 2 tabs PO Q a.m and 1 tab in evenings Ladell Pier, MD Taking Active   Metoprolol Tartrate 75 MG TABS [id number] Yes TAKE 1 TABLET BY MOUTH TWICE DAILY.  Patient taking differently: Take 75 mg by mouth 2 (two) times daily.   Ladell Pier, MD Taking Active   montelukast (SINGULAIR) 10 MG tablet [id number] Yes TAKE 1 TABLET BY MOUTH AT BEDTIME. Ladell Pier, MD Taking Active Multiple Informants           Med Note Elizbeth Squires Sep 10, 2020  2:19 PM)    Multiple Vitamin (MULTIVITAMIN WITH MINERALS) TABS tablet [id number] Yes Take 1 tablet by mouth daily.  [provider] Taking Active Multiple Informants  NARCAN 4 MG/0.1ML  LIQD nasal spray kit [id number] No SMARTSIG:1 Spray(s) Both Nares Once PRN  Patient not taking: Reported on 12/12/2020   [provider] Not Taking Active   Omega-3 Fatty Acids (FISH OIL) 1000 MG CAPS [id number] Yes Take 1,000 mg by mouth at bedtime.  [provider] Taking Active Multiple Informants  ondansetron (ZOFRAN) 4 MG tablet [id number] Yes Take 4 mg by mouth every 8 (eight) hours as needed for nausea or vomiting.  [provider] Taking Active Multiple Informants           Med Note Elizbeth Squires Sep 10, 2020  2:22 PM) PRN  OVER THE COUNTER MEDICATION [id number] Yes Take 1 capsule by mouth daily. BEET ROOT [provider] Taking Active Multiple Informants  pregabalin (LYRICA) 25 MG capsule [id number] Yes Take 25 mg by mouth 2 (two) times daily.  [provider] Taking Active Multiple Informants           Med Note Elizbeth Squires Sep 10, 2020  2:27 PM) Patient is taking Pregabalin, she didn't realize this was Lyrica  spironolactone (ALDACTONE) 25 MG tablet [id number] Yes Take 25 mg by mouth daily. [provider] Taking Active   tiZANidine (ZANAFLEX) 4 MG tablet [id number] Yes Take 4 mg by mouth 2 (two) times daily as needed for muscle spasms. [provider] Taking Active Multiple Informants  TRULICITY 8.41 QK/2.0SH SOPN [id number] Yes Inject 0.75 mg into the skin once a week.  [provider] Taking Active Multiple Informants           Med Note Payton Doughty   IXV Jul 27, 2020  4:34 PM) New med 07/24/2020 - not yet taken  valACYclovir (VALTREX) 500 MG tablet [id number] Yes Take 500 mg by mouth 2 (two) times daily. [provider] Taking Active            Med Note Payton Doughty   YBR Jul 27, 2020  4:46 PM) 7 day course filled 03/17/2020 and 04/09/2020  vitamin B-12 (CYANOCOBALAMIN) 1000 MCG tablet [id number] Yes Take 1,000 mcg by mouth daily. [provider] Taking Active Multiple  Informants  Vitamin D, Ergocalciferol, (DRISDOL) 1.25 MG (50000 UNIT) CAPS capsule [id number] Yes Take 50,000 Units by mouth once a week. [provider] Taking Active Multiple Informants  Med Note Payton Doughty   STM Jul 27, 2020  4:35 PM) New med 07/24/2020 - not yet taken          Patient Active Problem List   Diagnosis Date Noted  . Adjustment disorder with mixed disturbance of emotions and conduct 07/28/2020  . Polyp of descending colon 02/15/2020  . Morbid obesity with BMI of 60.0-69.9, adult ([place]) 07/17/2019  . Pedal edema 07/17/2019  . Multilevel degenerative disc disease 10/31/2018  . Abdominal pain 10/28/2018  . Change in bowel function 10/28/2018  . Severe episode of recurrent major depressive disorder, with psychotic features ([place]) 05/26/2018  . Gastritis and gastroduodenitis 04/26/2018  . Atrophic vaginitis 04/01/2018  . Diabetes mellitus ([place]) 03/31/2018  . Cough, persistent 02/08/2018  . Dysphagia 12/14/2017  . 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 Urinary incontinence 07/23/2017  . Early satiety 06/16/2017  . Hx of iron deficiency anemia 05/28/2017  . Throat pain in adult 04/05/2017  . Prediabetes 02/04/2017  . Dyspnea on exertion 12/23/2016  . 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 Hemoptysis 12/23/2016  . Fibromyalgia 08/18/2016  . Chronic lymphocytic thyroiditis 08/18/2016  . Depression 04/01/2016  . OSA (obstructive sleep apnea) 04/01/2016  . Essential hypertension 12/09/2015  . Hypothyroidism 12/09/2015  . Morbid obesity due to excess calories ([place]) 12/09/2015  . Constipation 05/27/2015  . Fatty liver 05/27/2015  . Abdominal pain, chronic, epigastric 07/04/2012  . Anxiety 06/09/2012  . History of gastroesophageal reflux (GERD) 06/09/2012  . Hyperlipidemia 06/09/2012  . Refusal of blood transfusions as patient is Jehovah's Witness 06/09/2012  . Pituitary macroadenoma ([place])  04/04/2012  . Abnormal small bowel biopsy 09/18/2011  . Lactose intolerance 09/18/2011  . GERD 01/21/2010    Conditions to be addressed/monitored per PCP order:  HTN and DMII  Care Plan : Weight loss to Improve Health  Updates made by Melissa Montane, RN since 12/12/2020 12:00 AM    Problem: Weight Management     Long-Range Goal: Achieving Weight Loss   Start Date: 08/22/2020  Expected End Date: 02/11/2021  This Visit's Progress: On track  Recent Progress: On track  Priority: High  Note:    Current Barriers:  Marland Kitchen Knowledge Deficits related to Weight loss. Robin [name] reports that she needs to work on weight loss. She is needing knee surgery, but needs to lose weight before having the procedure.  Recent weight was 346#. Robin [name] would like to lose 50#, eventually getting down to 230#. Patient is checking BS twice daily, readings have been high x 3 weeks(180-240). She reports taking her medication and cutting back on carbohydrates. She is interested in taking a diabetic education class. She started physical therapy since last visit and is excited about her first session of aquatic therapy tomorrow. . Unable to independently develop a plan for weight loss . Robin [name] is requesting a bedside commode, wheelchair and CPAP to better manage her health at home-She has received the wheelchair and bedside commode. Still waiting on CPAP-new order placed by Dr. Wynetta Emery   Nurse Case Manager Clinical Goal(s):  Marland Kitchen Over the next 30 days, patient will verbalize understanding of plan for weight management-Met-Robin [name] reports eating most meals at home, prepared by her husband. They have cut out all potatoes, rice and pasta, eating whole wheat bread and increased vegetables. She has been unable to exercise due to knee pain and is interested in Aquatic Therapy. Robin [name] will begin Aquatic Therapy on 11/14/20 . Over the next 60  days, patient will continue to meet with RN Care Manager to address ways to achieve weight  loss . Over the next 30 days, patient will work with CM team pharmacist to review medications-Met  Interventions:  . Inter-disciplinary care team collaboration (see longitudinal plan of care) . Provided education to patient re: diet modification, diabetes and exercise -RNCM will resend in the mail . Discussed plans with patient for ongoing care management follow up and provided patient with direct contact information for care management team . RNCM explained to patient the importance of managing diabetes and how it can help with weight loss . Collaborate with PCP for referral to diabetic education . Reviewed medications discussed her diabetic medication regimen . Discussed exercises that she can do while sitting . Reviewed upcoming appointments  . Encouragement provided to continue working toward goal  Patient Goals/Self-Care Activities Over the next 30 days, patient will: - check blood sugar at prescribed times - take the blood sugar log to all doctor visits - take the blood sugar meter to all doctor visits  - Self administers medications as prescribed - Attends all scheduled provider appointments - Calls provider office for new concerns or questions - change to whole grain breads, cereal, pasta - drink 6 to 8 glasses of water each day - eat 3 to 5 servings of fruits and vegetables each day - eat 5 or 6 small meals each day - limit fast food meals to no more than 1 per week - manage portion size - Eat three small meals a day instead of 2 larger meals - Cut out regular soda-switch to diet soda and increase water intake - keep a food diary - reduce red meat to 2 to 3 times a week  Follow Up Plan: The Managed Medicaid care management team will reach out to the patient again over the next 30 days on 01/14/21 @ 1pm       Follow Up:  Patient agrees to Care Plan and Follow-up.  Plan: The Managed Medicaid care management team will reach out to the patient again over the next 30  days.  Date/time of next scheduled RN care management/care coordination outreach:01/14/21 @ 1pm  [name] RN, [place] RN Care Coordinator

## 2020-12-12 NOTE — Patient Instructions (Signed)
Visit Information  Ms. Paulsen was given information about Medicaid Managed Care team care coordination services as a part of their Healthy [facility] Medicaid benefit. [name] verbally consented to engagement with the [facility] Managed Care team.   For questions related to your Healthy [facility] health plan, please call: [phone] or visit the homepage here: [email or web address]  If you would like to schedule transportation through your Healthy [facility] plan, please call the following number at least 2 days in advance of your appointment: [phone]  Ms. Marlou Sa - following are the goals we discussed in your visit today:  Goals Addressed            This Visit's Progress   . Monitor and Manage My Blood Sugar-Diabetes Type 2       Timeframe:  Long-Range Goal Priority:  High Start Date:  08/22/21                           Expected End Date: 02/11/21                  Follow Up Date 01/14/21   - check blood sugar at prescribed times - take the blood sugar log to all doctor visits - take the blood sugar meter to all doctor visits  - Self administers medications as prescribed - Attends all scheduled provider appointments - Calls provider office for new concerns or questions - change to whole grain breads, cereal, pasta - drink 6 to 8 glasses of water each day - eat 3 to 5 servings of fruits and vegetables each day - eat 5 or 6 small meals each day - limit fast food meals to no more than 1 per week - manage portion size - Eat three small meals a day instead of 2 larger meals - Cut out regular soda-switch to diet soda and increase water intake - keep a food diary - reduce red meat to 2 to 3 times a week   Why is this important?    Checking your blood sugar at home helps to keep it from getting very high or very low.   Writing the results in a diary or log helps the doctor know how to care for you.   Your blood sugar log should have the  time, date and the results.   Also, write down the amount of insulin or other medicine that you take.   Other information, like what you ate, exercise done and how you were feeling, will also be helpful.         . COMPLETED: New way of eating         - keep a food diary - reduce red meat to 2 to 3 times a week - set goal weight - switch to low-fat or skim milk       . COMPLETED: Planning for weight loss        - begin a weight loss diary - get rid of junk food - list ways to deal with barriers to success - pick a start date - stock up on healthy food choices - track feelings, emotional and physical, when eating           Please see education materials related to diabetic diet provided as print materials.     Diabetes Mellitus and Nutrition, Adult When you have diabetes, or diabetes mellitus, it is very important to have healthy eating habits because your blood sugar (glucose) levels  are greatly affected by what you eat and drink. Eating healthy foods in the right amounts, at about the same times every day, can help you:  Control your blood glucose.  Lower your risk of heart disease.  Improve your blood pressure.  Reach or maintain a healthy weight. What can affect my meal plan? Every person with diabetes is different, and each person has different needs for a meal plan. Your health care provider may recommend that you work with a dietitian to make a meal plan that is best for you. Your meal plan may vary depending on factors such as:  The calories you need.  The medicines you take.  Your weight.  Your blood glucose, blood pressure, and cholesterol levels.  Your activity level.  Other health conditions you have, such as heart or kidney disease. How do carbohydrates affect me? Carbohydrates, also called carbs, affect your blood glucose level more than any other type of food. Eating carbs naturally raises the amount of glucose in your blood. Carb counting is a  method for keeping track of how many carbs you eat. Counting carbs is important to keep your blood glucose at a healthy level, especially if you use insulin or take certain oral diabetes medicines. It is important to know how many carbs you can safely have in each meal. This is different for every person. Your dietitian can help you calculate how many carbs you should have at each meal and for each snack. How does alcohol affect me? Alcohol can cause a sudden decrease in blood glucose (hypoglycemia), especially if you use insulin or take certain oral diabetes medicines. Hypoglycemia can be a life-threatening condition. Symptoms of hypoglycemia, such as sleepiness, dizziness, and confusion, are similar to symptoms of having too much alcohol.  Do not drink alcohol if: ? Your health care provider tells you not to drink. ? You are pregnant, may be pregnant, or are planning to become pregnant.  If you drink alcohol: ? Do not drink on an empty stomach. ? Limit how much you use to:  0-1 drink a day for women.  0-2 drinks a day for men. ? Be aware of how much alcohol is in your drink. In the [place] one drink equals one 12 oz bottle of beer (355 mL), one 5 oz glass of wine (148 mL), or one 1 oz glass of hard liquor (44 mL). ? Keep yourself hydrated with water, diet soda, or unsweetened iced tea.  Keep in mind that regular soda, juice, and other mixers may contain a lot of sugar and must be counted as carbs. What are tips for following this plan? Reading food labels  Start by checking the serving size on the "Nutrition Facts" label of packaged foods and drinks. The amount of calories, carbs, fats, and other nutrients listed on the label is based on one serving of the item. Many items contain more than one serving per package.  Check the total grams (g) of carbs in one serving. You can calculate the number of servings of carbs in one serving by dividing the total carbs by 15. For example, if a food has  30 g of total carbs per serving, it would be equal to 2 servings of carbs.  Check the number of grams (g) of saturated fats and trans fats in one serving. Choose foods that have a low amount or none of these fats.  Check the number of milligrams (mg) of salt (sodium) in one serving. Most people should limit total  sodium intake to less than 2,300 mg per day.  Always check the nutrition information of foods labeled as "low-fat" or "nonfat." These foods may be higher in added sugar or refined carbs and should be avoided.  Talk to your dietitian to identify your daily goals for nutrients listed on the label. Shopping  Avoid buying canned, pre-made, or processed foods. These foods tend to be high in fat, sodium, and added sugar.  Shop around the outside edge of the grocery store. This is where you will most often find fresh fruits and vegetables, bulk grains, fresh meats, and fresh dairy. Cooking  Use low-heat cooking methods, such as baking, instead of high-heat cooking methods like deep frying.  Cook using healthy oils, such as olive, canola, or sunflower oil.  Avoid cooking with butter, cream, or high-fat meats. Meal planning  Eat meals and snacks regularly, preferably at the same times every day. Avoid going long periods of time without eating.  Eat foods that are high in fiber, such as fresh fruits, vegetables, beans, and whole grains. Talk with your dietitian about how many servings of carbs you can eat at each meal.  Eat 4-6 oz (112-168 g) of lean protein each day, such as lean meat, chicken, fish, eggs, or tofu. One ounce (oz) of lean protein is equal to: ? 1 oz (28 g) of meat, chicken, or fish. ? 1 egg. ?  cup (62 g) of tofu.  Eat some foods each day that contain healthy fats, such as avocado, nuts, seeds, and fish.   What foods should I eat? Fruits Berries. Apples. Oranges. Peaches. Apricots. Plums. Grapes. Mango. Papaya. Pomegranate. Kiwi. Cherries. Vegetables Lettuce.  Spinach. Leafy greens, including kale, chard, collard greens, and mustard greens. Beets. Cauliflower. Cabbage. Broccoli. Carrots. Green beans. Tomatoes. Peppers. Onions. Cucumbers. Brussels sprouts. Grains Whole grains, such as whole-wheat or whole-grain bread, crackers, tortillas, cereal, and pasta. Unsweetened oatmeal. Quinoa. Brown or wild rice. Meats and other proteins Seafood. Poultry without skin. Lean cuts of poultry and beef. Tofu. Nuts. Seeds. Dairy Low-fat or fat-free dairy products such as milk, yogurt, and cheese. The items listed above may not be a complete list of foods and beverages you can eat. Contact a dietitian for more information. What foods should I avoid? Fruits Fruits canned with syrup. Vegetables Canned vegetables. Frozen vegetables with butter or cream sauce. Grains Refined white flour and flour products such as bread, pasta, snack foods, and cereals. Avoid all processed foods. Meats and other proteins Fatty cuts of meat. Poultry with skin. Breaded or fried meats. Processed meat. Avoid saturated fats. Dairy Full-fat yogurt, cheese, or milk. Beverages Sweetened drinks, such as soda or iced tea. The items listed above may not be a complete list of foods and beverages you should avoid. Contact a dietitian for more information. Questions to ask a health care provider  Do I need to meet with a diabetes educator?  Do I need to meet with a dietitian?  What number can I call if I have questions?  When are the best times to check my blood glucose? Where to find more information:  American Diabetes Association: diabetes.org  Academy of Nutrition and Dietetics: www.eatright.[employer] of Diabetes and Digestive and Kidney Diseases: [employer].dk  Association of Diabetes Care and Education Specialists: www.diabeteseducator.org Summary  It is important to have healthy eating habits because your blood sugar (glucose) levels are greatly affected by  what you eat and drink.  A healthy meal plan will help you control your  blood glucose and maintain a healthy lifestyle.  Your health care provider may recommend that you work with a dietitian to make a meal plan that is best for you.  Keep in mind that carbohydrates (carbs) and alcohol have immediate effects on your blood glucose levels. It is important to count carbs and to use alcohol carefully. This information is not intended to replace advice given to you by your health care provider. Make sure you discuss any questions you have with your health care provider. Document Revised: 08/29/2019 Document Reviewed: 08/29/2019 Elsevier Patient Education  2021 [employer].   The patient verbalized understanding of instructions provided today and agreed to receive a mailed copy of patient instruction and/or educational materials.  Telephone follow up appointment with Managed Medicaid care management team member scheduled for:01/14/21 @ 1pm  Melissa Montane, RN  Following is a copy of your plan of care:  Patient Care Plan: Social Work - Depression (Adult)    Problem Identified: Depression Identification (Depression)     Goal: Psychiatry and Outpatient Therapy for Depressive Symptoms Completed 10/15/2020  Start Date: 08/28/2020  Expected End Date: 11/04/2020  This Visit's Progress: On track  Recent Progress: On track  Priority: High  Note:    Current Barriers:  . 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 . Transportation - Due to husband's treatment, he is not always available to transport patient to her appointments.  Marland Kitchen Update 08/28/2020: Transportation issues still create barriers for patient. She is aware that she can contact her Medicaid plan, [employer], for transportation assistance. Marland Kitchen Update 09/25/2020: Patient was unable to keep previously scheduled appointment on 66/29/4765 due to conflict with husband's  appointments. . Mental Health Concerns  . Patient's husband has cancer and is currently receiving treatment at the [facility] [place] in [place]. . Suicidal Ideation/Homicidal Ideation: No  Clinical Social Work Goal(s):  Marland Kitchen Over the next 60 days, patient will work with SW bi-weekly by telephone or in person to reduce or manage symptoms related to depression. . Over the next 30 days, patient will attend all scheduled mental health appointments: as scheduled. Patient will arrange transportation needs in the appropriate time frame to ensure she has transportation for scheduled appointments.  . Over the next 30 days, patient will demonstrate improved health management independence as evidenced by symptom management.   Interventions: . Patient interviewed and appropriate assessments performed: PHQ 2 and PHQ 9 . Patient interviewed and appropriate assessments performed . Provided patient with information about depression and physical illness. . Advised patient to discuss outpatient therapy with psychiatrist. Patient is scheduled for psychiatry at [facility] on 08/15/2020. LCSW encouraged patient to discuss therapy needs with the psychiatrist during this appointment so that she can receive both psychiatry and outpatient therapy at the same agency.  Marland Kitchen Update 08/28/2020: Patient stated she had to cancel the appointment due to transportation issues and reschedule for today, 08/28/2020 @ 2:30pm. She stated she would like to discuss therapy needs at that time.  Marland Kitchen Update 09/25/2020: Patient was unable to keep previously scheduled appointment on 46/50/3546 due to conflict with husband's appointments. Appointment was rescheduled to Monday, 09/30/2020. Marland Kitchen Emotional/Supportive Counseling . Update 09/25/2020: LCSW will follow up on October 15, 2020 @ 2:00pm. . Update 10/15/2020: Patient confirmed psychiatric appointment attendance at [facility]. She stated the psychiatrist is scheduling her with a therapist at the same agency.  Patient  Self Care Activities:  . Self administers medications as prescribed . Attends church or other social activities .  Ability for insight . Motivation for treatment . Strong family or social support  Patient Coping Strengths:  . Supportive Relationships . Family . Friends . Church . [employer] . Spirituality . Hopefulness  Patient Self Care Deficits:  . Unable to independently get around without assistance due to arthritis all over her body. . Unable to perform ADLs independently . Unable to perform IADLs independently . Husband has cancer. . Transportation issues due to husband receiving treatment for cancer.  Please see past updates related to this goal by clicking on the "Past Updates" button in the selected goal    Patient Care Plan: Weight loss to Improve Health    Problem Identified: Weight Management     Long-Range Goal: Achieving Weight Loss   Start Date: 08/22/2020  Expected End Date: 02/11/2021  This Visit's Progress: On track  Recent Progress: On track  Priority: High  Note:    Current Barriers:  Marland Kitchen Knowledge Deficits related to Weight loss. Ms. Barrell reports that she needs to work on weight loss. She is needing knee surgery, but needs to lose weight before having the procedure.  Recent weight was 346#. Ms. Vandall would like to lose 50#, eventually getting down to 230#. Patient is checking BS twice daily, readings have been high x 3 weeks(180-240). She reports taking her medication and cutting back on carbohydrates. She is interested in taking a diabetic education class. She started physical therapy since last visit and is excited about her first session of aquatic therapy tomorrow. . Unable to independently develop a plan for weight loss . Ms Ranker is requesting a bedside commode, wheelchair and CPAP to better manage her health at home-She has received the wheelchair and bedside commode. Still waiting on CPAP-new order placed by Dr. Wynetta Emery   Nurse Case  Manager Clinical Goal(s):  Marland Kitchen Over the next 30 days, patient will verbalize understanding of plan for weight management-Met-Ms Kendle reports eating most meals at home, prepared by her husband. They have cut out all potatoes, rice and pasta, eating whole wheat bread and increased vegetables. She has been unable to exercise due to knee pain and is interested in Aquatic Therapy. Ms Walmer will begin Aquatic Therapy on 11/14/20 . Over the next 60 days, patient will continue to meet with RN Care Manager to address ways to achieve weight loss . Over the next 30 days, patient will work with CM team pharmacist to review medications-Met  Interventions:  . Inter-disciplinary care team collaboration (see longitudinal plan of care) . Provided education to patient re: diet modification, diabetes and exercise -RNCM will resend in the mail . Discussed plans with patient for ongoing care management follow up and provided patient with direct contact information for care management team . RNCM explained to patient the importance of managing diabetes and how it can help with weight loss . Collaborate with PCP for referral to diabetic education . Reviewed medications discussed her diabetic medication regimen . Discussed exercises that she can do while sitting . Reviewed upcoming appointments  . Encouragement provided to continue working toward goal  Patient Goals/Self-Care Activities Over the next 30 days, patient will: - check blood sugar at prescribed times - take the blood sugar log to all doctor visits - take the blood sugar meter to all doctor visits  - Self administers medications as prescribed - Attends all scheduled provider appointments - Calls provider office for new concerns or questions - change to whole grain breads, cereal, pasta - drink 6 to 8 glasses of  water each day - eat 3 to 5 servings of fruits and vegetables each day - eat 5 or 6 small meals each day - limit fast food meals to no more than 1  per week - manage portion size - Eat three small meals a day instead of 2 larger meals - Cut out regular soda-switch to diet soda and increase water intake - keep a food diary - reduce red meat to 2 to 3 times a week  Follow Up Plan: The Managed Medicaid care management team will reach out to the patient again over the next 30 days on 01/14/21 @ 1pm

## 2020-12-13 ENCOUNTER — Encounter: Payer: Self-pay | Admitting: Physical Therapy

## 2020-12-13 ENCOUNTER — Ambulatory Visit: Payer: Medicaid Other | Admitting: Physical Therapy

## 2020-12-13 ENCOUNTER — Other Ambulatory Visit: Payer: Self-pay

## 2020-12-13 DIAGNOSIS — R262 Difficulty in walking, not elsewhere classified: Secondary | ICD-10-CM

## 2020-12-13 DIAGNOSIS — M6281 Muscle weakness (generalized): Secondary | ICD-10-CM

## 2020-12-13 DIAGNOSIS — R278 Other lack of coordination: Secondary | ICD-10-CM

## 2020-12-13 DIAGNOSIS — M25651 Stiffness of right hip, not elsewhere classified: Secondary | ICD-10-CM

## 2020-12-13 DIAGNOSIS — M25652 Stiffness of left hip, not elsewhere classified: Secondary | ICD-10-CM

## 2020-12-13 DIAGNOSIS — R2681 Unsteadiness on feet: Secondary | ICD-10-CM

## 2020-12-13 NOTE — Therapy (Signed)
[facility] [address], [place], [place], [address] Phone: [phone]   Fax:  [phone]  Physical Therapy Treatment  Patient Details  Name: Robin [name] MRN: [id number] Date of Birth: 1955/11/05 Referring Provider (PT): Karle Plumber, MD   Encounter Date: 12/13/2020   PT End of Session - 12/13/20 1634    Visit Number 3    Date for PT Re-Evaluation 01/09/21    Authorization Type Medicaid: submitted authorization for visits    PT Start Time 1445   15 min late   PT Stop Time 1515    PT Time Calculation (min) 30 min    Activity Tolerance Patient limited by pain    Behavior During Therapy [facility] for tasks assessed/performed           Past Medical History:  Diagnosis Date  . Anxiety disorder   . Arthritis   . Asthma   . Chronic neck pain   . Constipation   . Depression   . Diabetes mellitus without complication ([place])   . Dyspnea   . GERD (gastroesophageal reflux disease)   . Hiatal hernia    small  . Hyperlipidemia   . Hypertension   . Hypothyroidism   . Morbid obesity with BMI of 50.0-59.9, adult ([place])   . Schatzki's ring    non critical  . Sinusitis   . Sleep apnea    CPAP, Sleep study at [place]  . Sleep apnea     Past Surgical History:  Procedure Laterality Date  . ABDOMINAL HYSTERECTOMY    . ABDOMINAL HYSTERECTOMY  02/18/2000  . CARDIAC CATHETERIZATION  08/18/2003   normal L main/LAD/L Cfx/RCA (Dr. Adora Fridge)  . COLONOSCOPY  2006   Dr. [name], hyperplastic polyps  . COLONOSCOPY  08/06/11   abnormal terminal ileum for 10cm, erosions, geographical ulceration. Bx small bowel mucosa with prominent intramucosal lymphoid aggregates, slightly inflammed  . COLONOSCOPY WITH PROPOFOL N/A 12/28/2019   Rourk: 4 sessile serrated adenomas removed on the colon.  Next colonoscopy in 3 years.  Marland Kitchen DIRECT LARYNGOSCOPY  03/15/2012   Procedure: DIRECT LARYNGOSCOPY;  Surgeon: [name], MD;  Location: [place];  Service: ENT;  Laterality: N/A; Dr. Wolicki--:>no foreign body seen. normal esophagus to 40cm  . ESOPHAGEAL DILATION  04/17/2015   Procedure: ESOPHAGEAL DILATION;  Surgeon: [name], MD;  [facility];  Service: Endoscopy;;  . ESOPHAGOGASTRODUODENOSCOPY  08/23/2008   RKY:HCWC distal esophageal erosions consistent with mild erosive reflux esophagitis, otherwise unremarkable esophagus/ Tiny antral erosions of doubtful clinical significance, otherwise normal stomach, patent pylorus, normal D1 and D2  . ESOPHAGOGASTRODUODENOSCOPY  08/06/11   small hh, noncritical Schatzki's ring s/p 6 F  . ESOPHAGOGASTRODUODENOSCOPY N/A 04/17/2015   BJS:EGBTDV s/p dilation  . ESOPHAGOGASTRODUODENOSCOPY (EGD) WITH PROPOFOL N/A 01/20/2018   Dr. [name]: Erosive gastropathy, normal-appearing esophagus status post empiric dilation.  Chronic gastritis, no H. pylori.  . ESOPHAGOSCOPY  03/15/2012   Procedure: ESOPHAGOSCOPY;  Surgeon: [name], MD;  Location: [place];  Service: ENT;  Laterality: N/A;  . KNEE ARTHROSCOPY  04/27/2001  . MALONEY DILATION N/A 01/20/2018   Procedure: Venia Minks DILATION;  Surgeon: [name], MD;  [facility];  Service: Endoscopy;  Laterality: N/A;  . [place]  2003   negative bruce protocol exercise tress test; EF 68%; intermediate risk study due to evidence of anterior wall ischemia extending from mid-ventricle to apex  . PITUITARY SURGERY  06/2012   benign tumor, surgeon at [facility]  . POLYPECTOMY  12/28/2019   Procedure: POLYPECTOMY;  Surgeon: [name], MD;  [facility];  Service: Endoscopy;;  . RECTOCELE REPAIR    . TRANSTHORACIC ECHOCARDIOGRAM  2003   EF normal  . VAGINAL PROLAPSE REPAIR    . VIDEO BRONCHOSCOPY Bilateral 03/08/2017   Procedure: VIDEO BRONCHOSCOPY WITHOUT FLUORO;  Surgeon: Javier Glazier, MD;  Location: [place];  Service: Cardiopulmonary;  Laterality: Bilateral;     There were no vitals filed for this visit.   Subjective Assessment - 12/13/20 1633    Subjective My joints are better today, still not great but better. I walked into [employer] yesterday.    Pertinent History use of motorized W/C due to weakness and LE pain.    Currently in Pain? Yes   Knees 5-6/10   Pain Descriptors / Indicators Aching    Multiple Pain Sites No           Treatment: Aquatics Patient seen for aquatic therapy today.  Treatment took place in water 2.5-4 feet deep depending upon activity.  Pt entered the pool via steps with heavy use of the hand rails. Water temp 90 degrees F.  Seated water bench with 75% submersion Pt performed seated LE AROM exercises 20x in all planes, Blue triangle water weights shoulder horizontal add/abd 10x, small blue noodle presses for abdominal compression 10x  Water walking with water walker: mid waist to mid chest ( difficulty moving in mid chest depth) forward 4x half the length of the pool.  Standing mid waist depth holding onto the side of the pool: marching 10x, hip swings Bil 10x, and side steps 10x bil: complained of back pain.                            PT Short Term Goals - 12/09/20 0938      PT SHORT TERM GOAL #1   Title independent with initial HEP    Time 4    Period Weeks    Status Achieved    Target Date 12/12/20      PT SHORT TERM GOAL #2   Title walk with rolling walker x 30 feet and demonstrate moderate shortness of breath    Period Weeks    Status On-going   Walked 21 ft with RW 4x today   Target Date 12/12/20      PT SHORT TERM GOAL #3   Title initiate a walking program at home to improve ambulation and begin to wean from wheelchair use at home    Time 4    Period Weeks    Status Partially Met   Currently walking with RW living room to kitchen on the regular   Target Date 12/12/20             PT Long Term Goals - 11/14/20 1024      PT LONG TERM GOAL #1   Title Pt will be  independent with progression of final HEP    Baseline --    Time 8    Period Weeks    Status New    Target Date 01/09/21      PT LONG TERM GOAL #2   Title improve strength to wean from wheelchair use > or = to 30% of the time for home distances    Baseline using W/C > 90% of the time    Time 8  Period Weeks    Status New    Target Date 01/09/21      PT LONG TERM GOAL #3   Title improve LE flexibility to stand erect with standing x 1 minute    Baseline flexed knees, hip and trunk    Time 8    Period Weeks    Status New    Target Date 01/09/21      PT LONG TERM GOAL #4   Title improve strength and endurance to walk 50-75 feet with rolling walker with moderate shortness of breath    Baseline 25 feet with shortness of breath that required use of inhaler    Time 8    Period Weeks    Status New    Target Date 01/09/21      PT LONG TERM GOAL #5   Title perform standing exercise for 3-5 minutes in the clinic to improve endurance and independence with meal prep and self care tasks    Baseline 30 seconds    Time 8    Period Weeks    Status New    Target Date 01/09/21                 Plan - 12/13/20 1635    Clinical Impression Statement Pt arrived 15 min late to her first aquatic PT session. Joint pain is better than it was at the beginning of the week. Pt had some difficulty getting in & out of the pool. Pt was able to ambulate with the water walker 30 feet 4 times and perform a few standing exercises before her back started to hurt. Pt tolerated seated water bench exercises very well.    Personal Factors and Comorbidities Comorbidity 3+    Comorbidities obesity, W/C x 1 year, fibromyalgia, depression    Examination-Activity Limitations Stand;Stairs;Squat;Locomotion Level;Transfers;Dressing;Hygiene/Grooming    Examination-Participation Restrictions Cleaning;Community Activity;Meal Prep    Rehab Potential Good    PT Frequency 2x / week    PT Duration 8 weeks    PT  Treatment/Interventions ADLs/Self Care Home Management;Cryotherapy;Moist Heat;Neuromuscular re-education;Therapeutic exercise;Therapeutic activities;Functional mobility training;Stair training;Gait training;Patient/family education;Wheelchair mobility training;Manual techniques;Energy conservation;Passive range of motion;Taping;Aquatic Therapy    PT Next Visit Plan work on transitions, hip and knee flexibility, gait with rolling walker.  Aquatic PT when available    PT Home Exercise Plan Access Code: [email or web address]    Consulted and Agree with Plan of Care Patient           Patient will benefit from skilled therapeutic intervention in order to improve the following deficits and impairments:  Abnormal gait,Decreased activity tolerance,Decreased balance,Decreased strength,Postural dysfunction,Impaired flexibility,Hypomobility,Pain,Decreased endurance,Difficulty walking,Decreased range of motion  Visit Diagnosis: Muscle weakness (generalized)  Difficulty in walking, not elsewhere classified  Stiffness of left hip, not elsewhere classified  Stiffness of right hip, not elsewhere classified  Other lack of coordination  Unsteadiness on feet     Problem List Patient Active Problem List   Diagnosis Date Noted  . Adjustment disorder with mixed disturbance of emotions and conduct 07/28/2020  . Polyp of descending colon 02/15/2020  . Morbid obesity with BMI of 60.0-69.9, adult ([place]) 07/17/2019  . Pedal edema 07/17/2019  . Multilevel degenerative disc disease 10/31/2018  . Abdominal pain 10/28/2018  . Change in bowel function 10/28/2018  . Severe episode of recurrent major depressive disorder, with psychotic features ([place]) 05/26/2018  . Gastritis and gastroduodenitis 04/26/2018  . Atrophic vaginitis 04/01/2018  . Diabetes mellitus ([place]) 03/31/2018  . Cough, persistent 02/08/2018  . Dysphagia  12/14/2017  . 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 Urinary incontinence 07/23/2017  . Early satiety  06/16/2017  . Hx of iron deficiency anemia 05/28/2017  . Throat pain in adult 04/05/2017  . Prediabetes 02/04/2017  . Dyspnea on exertion 12/23/2016  . 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 Hemoptysis 12/23/2016  . Fibromyalgia 08/18/2016  . Chronic lymphocytic thyroiditis 08/18/2016  . Depression 04/01/2016  . OSA (obstructive sleep apnea) 04/01/2016  . Essential hypertension 12/09/2015  . Hypothyroidism 12/09/2015  . Morbid obesity due to excess calories ([place]) 12/09/2015  . Constipation 05/27/2015  . Fatty liver 05/27/2015  . Abdominal pain, chronic, epigastric 07/04/2012  . Anxiety 06/09/2012  . History of gastroesophageal reflux (GERD) 06/09/2012  . Hyperlipidemia 06/09/2012  . Refusal of blood transfusions as patient is Jehovah's Witness 06/09/2012  . 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 Abnormal small bowel biopsy 09/18/2011  . Lactose intolerance 09/18/2011  . GERD 01/21/2010    Maevyn Riordan, PTA 12/13/2020, 4:39 PM  [facility] [address], [place], [place], [address] Phone: [phone]   Fax:  [phone]  Name: Robin [name] MRN: [id number] Date of Birth: 04-14-1956

## 2020-12-16 ENCOUNTER — Other Ambulatory Visit: Payer: Self-pay | Admitting: Internal Medicine

## 2020-12-17 ENCOUNTER — Ambulatory Visit: Payer: Medicaid Other | Admitting: Internal Medicine

## 2020-12-17 NOTE — Progress Notes (Deleted)
Subjective:     Patient ID: Robin [name], female   DOB: 1956/05/29       MRN: [id number]     Brief patient profile:  35  yobf quit smoking in 1994 seen previously for dyspnea, chronic allergic rhinitis and hemoptysis (felt secondary to erosions from tonsils status post bronchoscopy) - has sensation of globus/ dysphagia dating back at least to 2009   Has seen Dr [name] for pos allergies but does not recall details (even symptoms)      TESTS  PFT 02/09/17: FVC  2.35 L (77%) FEV1 2.06 L (85%) FEV1/FVC 0.88 FEF 25-75 3.32 L (142%) negative bronchodilator response TLC 3.74 L (68%) RV 71% ERV 28% DLCO corrected 71% 08/22/15: FVC 2.32 L (119%) FEV1 1.99 L (121%) FEV1/FVC 0.85 FEF 25-75 2.59 L (118%)  6MWT 02/16/17:  Walked 126 meters / Baseline Sat 99%  On RA / Nadir Sat 99% on RA  METHACHOLINE CHALLENGE TEST (02/24/17):  Normal bronchial hyperreactivity.  IMAGING BARIUM SWALLOW/ESOPHAGRAM 04/30/17 (per radiologist):  Hiatal hernia with mild reflux. Esophageal dysmotility  CT NECK/SOFT TISSUE W/ CONTRAST 04/19/17 (per radiologist): No inflammatory change or foreign body can be seen in the pharynx.  Cervical spondylosis. 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  CT CHEST W/O 01/07/17   No parenchymal nodule, mass, or opacity appreciated. No pleural effusion or thickening. No pericardial effusion. No pathologic mediastinal adenopathy.  CTA CHEST 12/13/15   No pulmonary emboli. No pleural effusion or thickening. No pericardial effusion. No pathologic mediastinal adenopathy. No parenchymal nodule, mass, or opacification.  CARDIAC TTE (09/29/16): LV normal in size with moderate concentric hypertrophy. EF 55-60% with no regional wall motion abnormalities. Indeterminant diastolic function. LA & RA normal in size. RV normal in size and function. No aortic stenosis or regurgitation. Aortic root normal in size. No mitral stenosis or regurgitation. No  significant pulmonic regurgitation. No significant tricuspid regurgitation. No pericardial effusion.  LEFT HEART CATHETERIZATION (11/13/3):  Aortic systolic pressure 092  Diastolic pressure 90  Left ventricular systolic pressure 330  End-diastolic pressure 27 left main coronary artery normal  Left anterior descending artery normal  Left circumflex coronary artery normal  Right coronary artery dominant and normal   Overall estimated ejection fraction greater than 60% without focal wall motion abnormality  MICROBIOLOGY Endobronchial/Endotracheal Brushing 03/08/17: Rare Haemophilus influenza beta lactamase positive  PATHOLOGY Endobronchial/Endotracheal Brushing 03/08/17: No malignancy. Negative for herpes & HPV.  LABS 05/28/17 BNP:  6.1 ESR:  34 TSH:  1.060 Anti-CCP:  6 RF:  <10  02/16/17 IgE: 81 RAST panel: Cockroach 0.2 / D. farinae 0.2 / D. pteronyssinus 0.24   12/23/16 INR: 1.0 PTT: 24.1 Chromatin Ab:  <0.2 Smith Ab: <0.2 DS DNA Ab:  <1 SSA:  <0.2 SSB:  <0.2 Anti-CCP:  <16 SCL-70:  <0.2 ENA RNP Ab: Centromere Ab Screen:  <0.2 Jo-1 Ab:  <0.2  05/12/16 ANA: Negative Rheumatoid factor:  <10     02/08/2018 acute extended ov/Glennon Kopko re:  acute on chronic cough x decades / establish with me Robin [name] pt) Chief Complaint  Patient presents with  . Acute Visit    Pt c/o cough with yellow, wheezing and SOB x 3 days. She states her chest feels sore when she coughs. She has been using her albuterol inahler 3 x daily on average.   baseline = maint protonix right before first meal then before supper, and singuliar and needing saba  3 x  Times a day but not noct then severe  cough /subj  wheeze/ chest soreness 3 days prior to OV and sob with any activity  rec Prednisone 10 mg take  4 each am x 2 days,   2 each am x 2 days,  1 each am x 2 days and stop  zpak Protonix 40  Mg Take 30- 60 min before your first and last meals of the day  For drainage / throat tickle try take  CHLORPHENIRAMINE  4 mg - take one every 4 hours as needed   Take delsym two tsp every 12 hours and supplement if needed with  tramadol 50 mg up to 2 every 4 hours GERD  Diet   Please schedule a follow up office visit in 4 weeks, sooner if needed  with all medications /inhalers/ solutions in hand so we can verify exactly what you are taking. This includes all medications from all doctors and over the counters      03/08/2018  f/u ov/ re:  Cough x decades / did not bring all meds but most of them  Chief Complaint  Patient presents with  . Follow-up    Breathing is unchanged. She is still coughing up yellow sputum. She is using her albuterol inhaler 3 x per wk on average.   Dyspnea:  Even if not coughing doe x room to room  X  Years = MMRC3 = can't walk 100 yards even at a slow pace at a flat grade s stopping due to sob  = stops a lot to shop  Cough: fits of cough are  Sporadic daytime mostly  Sleep: she says fine, husband hears wheezing  SABA use:  Doesn't really help/ note MCT neg  rec Stop corevidol and losartan and instead try bisoprolol 5 mg twice daily  For cough try tessalon 200 mg every 8 hours as needed  For drainage / throat tickle try take CHLORPHENIRAMINE  4 mg - take one every 4 hours as needed   Please see patient coordinator before you leave today  to schedule sinus CT> never done denied by insurance          07/19/2018  Acute extended  ov/ re: cough / dysphagia/ globus > 10 y worse since off gabapentin 300 mg x 4 weeks with worse doe also / did not bring updated med calendar as requested  Chief Complaint  Patient presents with  . Acute Visit    Increased SOB and cough x 3-4 wks. Her cough is occ prod with green to yellow sputum.  She also c/o hoarseness.   Dyspnea:   MMRC3 = can't walk 100 yards even at a slow pace at a flat grade s stopping due to sob  - can't do  [employer] Cough:  sporadic Sleeping: sleeps fine bed flat/ 2 pillows  SABA use: none Says  tessalon works the best for cough rec Add back gabapentin 300 mg four times a day as per med calendar Change tessilon to 200 mg up to every 8 hours as needed for cough  and stop delsym If can't stop coughing then add hydrocodone up to every 4 hours if needed  If not better next step is to return to ENT (Wolicki) then if not satisfied please return here  with all medications /inhalers/ solutions     05/11/2019  f/u ov/ re: uacs / MO with doe /brought meds but they don't correlate well with med list  Chief Complaint  Patient presents with  . Follow-up    Patient reports that   her sob has increased and she still has a dry cough.   Dyspnea:  Was able to walk at [place], now using scooter x 2 month Cough: still severe cough day > noct, dry > wet with globus sensatin and hoarseness but still subj "wheezeling"  Sleeping: sleeps ok  in flat bed, several pillows SABA use: no better with saba  02: none  Overt hb despite dexilant / no longer on aciphex though still listed rec Prednisone 10 mg take  4 each am x 2 days,   2 each am x 2 days,  1 each am x 2 days and stop  GERD (REFLUX)   For congestion ok to chlotrabs 4 mg x 2 every hours as needed  We will  schedule ENT eval/ [facility] re your cough    12/04/2019  f/u ov/Robin [name] re: uacs  Chief Complaint  Patient presents with  . Follow-up    Increased cough and congestion x 4 months. She will occ produce some yellow to green sputum. She is using her albuterol inhaler about 2 x per day on average.   Dyspnea:  Uses walker across the room then stops < 25 ft x months  Cough: esp daytime min mucoid Sleeping: on side flat bed 4 pillows  SABA use: 2 different albuterols/ easily confused with details of care  02: none  rec Prednisone 10 mg take  4 each am x 2 days,   2 each am x 2 days,  1 each am x 2 days and stop  Try dexilant 60 mg   Take 30-60 min before first meal of the day and Pepcid ac (famotidine) 20 mg one after supper until return to office   GERD diet  For congestion ok to chlotrabs 4 mg x 2 every hours as needed  Only use your albuterol as a rescue medication to be used if you can't catch your breath  See  ENT eval/ [facility] re your cough and ears   01/15/2020  f/u ov/Robin [name] re: uacs/ ? Allergic rhinitis with pnds (w/u by [name] previously and see last RAST 2018 ) / did not bring chlortabs but says taking 2 bid Chief Complaint  Patient presents with  . Follow-up    Coughing less since the last visit but she states her SOB and wheezing seem worse. She is using her albuterol inhaler at least 3 x per day.   Dyspnea: room to room really no better p pred / more limited by L hip/knee pain Cough: better p prednisone, some worse since completed Sleeping: flat bed with 4 pillows wakes up to br p 3-4 h and cough and wheeze when walking to BR SABA use: during the day with activity (0% effective technique - see a/p)  02: none  rec Work on inhaler technique:  relax and gently blow all the way out then take a nice smooth deep breath back in, triggering the inhaler at same time you start breathing in.  Rinse and gargle with water when done Try albuterol (ventolin) 15 min before an activity that you know would make you short of breath and see if it makes any difference and if makes none then don't take it after activity unless you can't catch your breath. If your breathing gets worse > Prednisone 10 mg take  4 each am x 2 days,   2 each am x 2 days,  1 each am x 2 days and stop  For drainage / throat tickle try take CHLORPHENIRAMINE  4 mg  (  Chlortab 4mg  at [facility]/ [facility] should be easiest to find in the green box)  take one-two  every 4 hours as needed - available over the counter- may cause drowsiness so start with just a bedtime dose or two and see how you tolerate it before trying in daytime        04/19/2020  f/u ov/ re: sob/cough x 2009 / had both covid  shots Chief Complaint  Patient presents with  . Follow-up  Dyspnea: 25 ft  and stops due to sob / knees hurt  Cough: some worse mostly daytime/ smells trigger esp cig smoke/ sense of pnds / resolves with prednisone short term   Sleeping:  30 degrees elevation hob with electric bed does fine unless needs to get up to pee  SABA use: avg once a day  02: none  rec See Dr Hicks or her group and let them know prednisone helps your symptoms Please schedule a follow up visit in 3 months with PFTs on return -  but call sooner if needed   05/16/20 spirometry restriction, obstruction, nl fv curve   12/17/2020  f/u ov/ re:  No chief complaint on file.   Dyspnea:  *** Cough: *** Sleeping: *** SABA use: *** 02: *** Covid status:   ***   No obvious day to day or daytime variability or assoc excess/ purulent sputum or mucus plugs or hemoptysis or cp or chest tightness, subjective wheeze or overt sinus or hb symptoms.   *** without nocturnal  or early am exacerbation  of respiratory  c/o's or need for noct saba. Also denies any obvious fluctuation of symptoms with weather or environmental changes or other aggravating or alleviating factors except as outlined above   No unusual exposure hx or h/o childhood pna/ asthma or knowledge of premature birth.  Current Allergies, Complete Past Medical History, Past Surgical History, Family History, and Social History were reviewed in [facility] electronic medical record.  ROS  The following are not active complaints unless bolded Hoarseness, sore throat, dysphagia, dental problems, itching, sneezing,  nasal congestion or discharge of excess mucus or purulent secretions, ear ache,   fever, chills, sweats, unintended wt loss or wt gain, classically pleuritic or exertional cp,  orthopnea pnd or arm/hand swelling  or leg swelling, presyncope, palpitations, abdominal pain, anorexia, nausea, vomiting, diarrhea  or change in bowel habits or change in bladder habits, change in stools or change in urine, dysuria, hematuria,  rash,  arthralgias, visual complaints, headache, numbness, weakness or ataxia or problems with walking or coordination,  change in mood or  memory.        No outpatient medications have been marked as taking for the 12/17/20 encounter (Appointment) with ,  B, MD.             Objective:   Physical Exam     12/17/2020        *** 04/19/2020         335  01/15/2020         354  12/04/2019           355  05/11/2019           331  07/19/2018       328  03/08/2018            337   02/08/18 (!) 333 lb (151 kg)  01/27/18 (!) 328 lb 9.6 oz (149.1 kg)  12/14/17 (!) 326 lb 6.4 oz (148.1 kg)    Vital   signs reviewed  12/17/2020  - Note at rest 02 sats  ***% on ***   General appearance:    ***       Trace ankle pitting bilaterally***             Assessment:

## 2020-12-18 ENCOUNTER — Other Ambulatory Visit: Payer: Self-pay

## 2020-12-18 ENCOUNTER — Encounter: Payer: Self-pay | Admitting: Physical Therapy

## 2020-12-18 ENCOUNTER — Ambulatory Visit: Payer: Medicaid Other | Admitting: Physical Therapy

## 2020-12-18 DIAGNOSIS — R278 Other lack of coordination: Secondary | ICD-10-CM

## 2020-12-18 DIAGNOSIS — M25652 Stiffness of left hip, not elsewhere classified: Secondary | ICD-10-CM

## 2020-12-18 DIAGNOSIS — M6281 Muscle weakness (generalized): Secondary | ICD-10-CM

## 2020-12-18 DIAGNOSIS — M25651 Stiffness of right hip, not elsewhere classified: Secondary | ICD-10-CM

## 2020-12-18 DIAGNOSIS — R262 Difficulty in walking, not elsewhere classified: Secondary | ICD-10-CM

## 2020-12-18 NOTE — Therapy (Signed)
[facility] [address] [place], [place], [address] Phone: [phone]   Fax:  [phone]  Physical Therapy Treatment  Patient Details  Name: Robin [name] MRN: [id number] Date of Birth: 03-Jun-1956 Referring Provider (Robin [name]): Jonah Blue, MD   Encounter Date: 12/18/2020   Robin [name] End of Session - 12/18/20 0958    Visit Number 4    Date for Robin [name] Re-Evaluation 01/09/21    Authorization Type Medicaid: submitted authorization for visits    Authorization Time Period 12 visits, auth time period 12/11/20-01/21/21    Authorization - Visit Number 3    Authorization - Number of Visits 12    Robin [name] Start Time 0933    Robin [name] Stop Time 1013    Robin [name] Time Calculation (min) 40 min           Past Medical History:  Diagnosis Date  . Anxiety disorder   . Arthritis   . Asthma   . Chronic neck pain   . Constipation   . Depression   . Diabetes mellitus without complication (HCC)   . Dyspnea   . GERD (gastroesophageal reflux disease)   . Hiatal hernia    small  . Hyperlipidemia   . Hypertension   . Hypothyroidism   . Morbid obesity with BMI of 50.0-59.9, adult (HCC)   . Schatzki's ring    non critical  . Sinusitis   . Sleep apnea    CPAP, Sleep study at [facility]  . Sleep apnea     Past Surgical History:  Procedure Laterality Date  . ABDOMINAL HYSTERECTOMY    . ABDOMINAL HYSTERECTOMY  02/18/2000  . CARDIAC CATHETERIZATION  08/18/2003   normal L main/LAD/L Cfx/RCA (Dr. Erlene Quan)  . COLONOSCOPY  2006   Dr. Franky Macho, hyperplastic polyps  . COLONOSCOPY  08/06/11   abnormal terminal ileum for 10cm, erosions, geographical ulceration. Bx small bowel mucosa with prominent intramucosal lymphoid aggregates, slightly inflammed  . COLONOSCOPY WITH PROPOFOL N/A 12/28/2019   Rourk: 4 sessile serrated adenomas removed on the colon.  Next colonoscopy in 3 years.  Marland Kitchen DIRECT LARYNGOSCOPY  03/15/2012   Procedure: DIRECT  LARYNGOSCOPY;  Surgeon: Flo Shanks, MD;  Location: [facility] OR;  Service: ENT;  Laterality: N/A; Dr. Wolicki--:>no foreign body seen. normal esophagus to 40cm  . ESOPHAGEAL DILATION  04/17/2015   Procedure: ESOPHAGEAL DILATION;  Surgeon: Corbin Ade, MD;  [facility];  Service: Endoscopy;;  . ESOPHAGOGASTRODUODENOSCOPY  08/23/2008   YWV:PXTG distal esophageal erosions consistent with mild erosive reflux esophagitis, otherwise unremarkable esophagus/ Tiny antral erosions of doubtful clinical significance, otherwise normal stomach, patent pylorus, normal D1 and D2  . ESOPHAGOGASTRODUODENOSCOPY  08/06/11   small hh, noncritical Schatzki's ring s/p 56 F  . ESOPHAGOGASTRODUODENOSCOPY N/A 04/17/2015   GYI:RSWNIO s/p dilation  . ESOPHAGOGASTRODUODENOSCOPY (EGD) WITH PROPOFOL N/A 01/20/2018   Dr. Jena Gauss: Erosive gastropathy, normal-appearing esophagus status post empiric dilation.  Chronic gastritis, no H. pylori.  . ESOPHAGOSCOPY  03/15/2012   Procedure: ESOPHAGOSCOPY;  Surgeon: Flo Shanks, MD;  Location: [facility] OR;  Service: ENT;  Laterality: N/A;  . KNEE ARTHROSCOPY  04/27/2001  . MALONEY DILATION N/A 01/20/2018   Procedure: Elease Hashimoto DILATION;  Surgeon: Corbin Ade, MD;  [facility];  Service: Endoscopy;  Laterality: N/A;  . NM MYOCAR PERF WALL MOTION  2003   negative bruce protocol exercise tress test; EF 68%; intermediate risk study due to evidence of anterior wall ischemia extending from mid-ventricle  to apex  . PITUITARY SURGERY  06/2012   benign tumor, surgeon at [facility]  . POLYPECTOMY  12/28/2019   Procedure: POLYPECTOMY;  Surgeon: [name], MD;  [facility];  Service: Endoscopy;;  . RECTOCELE REPAIR    . TRANSTHORACIC ECHOCARDIOGRAM  2003   EF normal  . VAGINAL PROLAPSE REPAIR    . VIDEO BRONCHOSCOPY Bilateral 03/08/2017   Procedure: VIDEO BRONCHOSCOPY WITHOUT FLUORO;  Surgeon: Javier Glazier, MD;  Location: [place];  Service: Cardiopulmonary;   Laterality: Bilateral;    There were no vitals filed for this visit.   Subjective Assessment - 12/18/20 0934    [name]  Bil knee pain today - "I can tell it's going to rain."    Pertinent History use of motorized W/C due to weakness and LE pain.    Limitations Standing;Walking    How long can you stand comfortably? 5 min max    How long can you walk comfortably? 20-25 steps with walker    Patient Stated Goals get stronger, walk more, stand longer    Currently in Pain? Yes    Pain Score 8     Pain Location Knee    Pain Orientation Right;Left    Pain Descriptors / Indicators Aching    Pain Type Chronic pain    Aggravating Factors  stand, walk    Pain Relieving Factors meds                             [facility] Adult Robin [name] Treatment/Exercise - 12/18/20 0001      Ambulation/Gait   Ambulation/Gait Yes    Ambulation/Gait Assistance 5: Supervision    Ambulation Distance (Feet) 33 Feet    Assistive device Rolling walker      Self-Care   Self-Care Other Self-Care Comments    Other Self-Care Comments  verbal discussion of counter weight shifting to progress upright standing endurance and LE flexiblity for more erect standing      Knee/Hip Exercises: Stretches   Active Hamstring Stretch Both;3 reps;10 seconds    Active Hamstring Stretch Limitations seated      Knee/Hip Exercises: Standing   Gait Training chair to chair across 10' w/ RW x 4 rounds w/ short recovery 15-20 sec, close supervision and monitoring of SOB (min/mod per Robin [name])      Knee/Hip Exercises: Seated   Long Arc Quad Both;2 sets;10 reps    [facility] 10x3" hold    Other Seated Knee/Hip Exercises Ankle DF/PF 20x    Marching 20 reps;2 sets    Abd/Adduction Limitations foot on slider hip abd in 90/90 x 10 each    Sit to Sand 5 reps;with UE support   tall stand x 5" each rep, VC for slower stand to sit for eccentricl control, able to  contol first 1/2 of descent to chair + pad                   Robin [name] Short Term Goals - 12/18/20 0953      Robin [name] SHORT TERM GOAL #1   Title independent with initial HEP    Status Achieved      Robin [name] SHORT TERM GOAL #2   Title walk with rolling walker x 30 feet and demonstrate moderate shortness of breath    Baseline Robin [name] reported mod SOB, did use inhaler    Status Achieved  Robin [name] Long Term Goals - 11/14/20 1024      Robin [name] LONG TERM GOAL #1   Title Robin [name] will be independent with progression of final HEP    Baseline --    Time 8    Period Weeks    Status New    Target Date 01/09/21      Robin [name] LONG TERM GOAL #2   Title improve strength to wean from wheelchair use > or = to 30% of the time for home distances    Baseline using W/C > 90% of the time    Time 8    Period Weeks    Status New    Target Date 01/09/21      Robin [name] LONG TERM GOAL #3   Title improve LE flexibility to stand erect with standing x 1 minute    Baseline flexed knees, hip and trunk    Time 8    Period Weeks    Status New    Target Date 01/09/21      Robin [name] LONG TERM GOAL #4   Title improve strength and endurance to walk 50-75 feet with rolling walker with moderate shortness of breath    Baseline 25 feet with shortness of breath that required use of inhaler    Time 8    Period Weeks    Status New    Target Date 01/09/21      Robin [name] LONG TERM GOAL #5   Title perform standing exercise for 3-5 minutes in the clinic to improve endurance and independence with meal prep and self care tasks    Baseline 30 seconds    Time 8    Period Weeks    Status New    Target Date 01/09/21                 Plan - 12/18/20 1013    Clinical Impression Statement Robin [name] arrived with stiffness and pain in bil knees "I can feel it is going to rain."  She met [facility] today ambulating with RW x 33' with mod SOB (needed inhaler/water/short break).  Robin [name] also worked on cardiovascular functional endurance with 10' ambulation chair to chair  with RW x 4 rounds with short 15-20 sec break.  Robin [name] cued tall standing and heel strike with ambulation.  Robin [name] showed improved weight shifting and step length/heel strike with cues.  She had good work rate and was quick to recover when she became SOB with only single use of inhaler today.  Continue aquatic and land based Robin [name] to address deconditioning and functional strength/endurance.    Comorbidities obesity, W/C x 1 year, fibromyalgia, depression    Robin [name] Frequency 2x / week    Robin [name] Duration 8 weeks    Robin [name] Treatment/Interventions ADLs/Self Care Home Management;Cryotherapy;Moist Heat;Neuromuscular re-education;Therapeutic exercise;Therapeutic activities;Functional mobility training;Stair training;Gait training;Patient/family education;Wheelchair mobility training;Manual techniques;Energy conservation;Passive range of motion;Taping;Aquatic Therapy    Robin [name] Next Visit Plan work on transitions, hip and knee flexibility, gait with rolling walker.  Aquatic Robin [name], has Robin [name] started to walk short household distances w/ RW    Robin [name] Home Exercise Plan Access Code: [email or web address]    Consulted and Agree with Plan of Care Patient           Patient will benefit from skilled therapeutic intervention in order to improve the following deficits and impairments:     Visit Diagnosis: Muscle weakness (generalized)  Difficulty in walking, not elsewhere classified  Stiffness of left hip, not elsewhere classified  Stiffness of right hip, not elsewhere  classified  Other lack of coordination     Problem List Patient Active Problem List   Diagnosis Date Noted  . Adjustment disorder with mixed disturbance of emotions and conduct 07/28/2020  . Polyp of descending colon 02/15/2020  . Morbid obesity with BMI of 60.0-69.9, adult ([place]) 07/17/2019  . Pedal edema 07/17/2019  . Multilevel degenerative disc disease 10/31/2018  . Abdominal pain 10/28/2018  . Change in bowel function 10/28/2018  . Severe episode of recurrent major depressive  disorder, with psychotic features ([place]) 05/26/2018  . Gastritis and gastroduodenitis 04/26/2018  . Atrophic vaginitis 04/01/2018  . Diabetes mellitus ([place]) 03/31/2018  . Cough, persistent 02/08/2018  . Dysphagia 12/14/2017  . 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 Urinary incontinence 07/23/2017  . Early satiety 06/16/2017  . Hx of iron deficiency anemia 05/28/2017  . Throat pain in adult 04/05/2017  . Prediabetes 02/04/2017  . Dyspnea on exertion 12/23/2016  . 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 Hemoptysis 12/23/2016  . Fibromyalgia 08/18/2016  . Chronic lymphocytic thyroiditis 08/18/2016  . Depression 04/01/2016  . OSA (obstructive sleep apnea) 04/01/2016  . Essential hypertension 12/09/2015  . Hypothyroidism 12/09/2015  . Morbid obesity due to excess calories ([place]) 12/09/2015  . Constipation 05/27/2015  . Fatty liver 05/27/2015  . Abdominal pain, chronic, epigastric 07/04/2012  . Anxiety 06/09/2012  . History of gastroesophageal reflux (GERD) 06/09/2012  . Hyperlipidemia 06/09/2012  . Refusal of blood transfusions as patient is Jehovah's Witness 06/09/2012  . 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 Abnormal small bowel biopsy 09/18/2011  . Lactose intolerance 09/18/2011  . GERD 01/21/2010    Robin [name], Robin [name] 12/18/20 10:16 AM   [facility] [address] [place], [place], [address] Phone: [phone]   Fax:  [phone]  Name: Robin [name] MRN: [id number] Date of Birth: 1956-09-08

## 2020-12-20 ENCOUNTER — Ambulatory Visit: Payer: Medicaid Other | Admitting: Physical Therapy

## 2020-12-25 ENCOUNTER — Encounter: Payer: Medicaid Other | Admitting: Physical Therapy

## 2021-01-01 ENCOUNTER — Encounter: Payer: Medicaid Other | Admitting: Physical Therapy

## 2021-01-02 ENCOUNTER — Ambulatory Visit: Payer: Medicaid Other | Admitting: Internal Medicine

## 2021-01-02 NOTE — Progress Notes (Deleted)
Subjective:     Patient ID: Robin [name], female   DOB: 09/22/1956       MRN: [id number]     Brief patient profile:  64  yobf quit smoking in 1994 seen previously for dyspnea, chronic allergic rhinitis and hemoptysis (felt secondary to erosions from tonsils status post bronchoscopy) - has sensation of globus/ dysphagia dating back at least to 2009   Has seen Dr Hicks for pos allergies but does not recall details (even symptoms)      TESTS  PFT 02/09/17: FVC  2.35 L (77%) FEV1 2.06 L (85%) FEV1/FVC 0.88 FEF 25-75 3.32 L (142%) negative bronchodilator response TLC 3.74 L (68%) RV 71% ERV 28% DLCO corrected 71% 08/22/15: FVC 2.32 L (119%) FEV1 1.99 L (121%) FEV1/FVC 0.85 FEF 25-75 2.59 L (118%)  6MWT 02/16/17:  Walked 126 meters / Baseline Sat 99%  On RA / Nadir Sat 99% on RA  METHACHOLINE CHALLENGE TEST (02/24/17):  Normal bronchial hyperreactivity.  IMAGING BARIUM SWALLOW/ESOPHAGRAM 04/30/17 (per radiologist):  Hiatal hernia with mild reflux. Esophageal dysmotility  CT NECK/SOFT TISSUE W/ CONTRAST 04/19/17 (per radiologist): No inflammatory change or foreign body can be seen in the pharynx.  Cervical spondylosis. 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  CT CHEST W/O 01/07/17   No parenchymal nodule, mass, or opacity appreciated. No pleural effusion or thickening. No pericardial effusion. No pathologic mediastinal adenopathy.  CTA CHEST 12/13/15   No pulmonary emboli. No pleural effusion or thickening. No pericardial effusion. No pathologic mediastinal adenopathy. No parenchymal nodule, mass, or opacification.  CARDIAC TTE (09/29/16): LV normal in size with moderate concentric hypertrophy. EF 55-60% with no regional wall motion abnormalities. Indeterminant diastolic function. LA & RA normal in size. RV normal in size and function. No aortic stenosis or regurgitation. Aortic root normal in size. No mitral stenosis or regurgitation. No  significant pulmonic regurgitation. No significant tricuspid regurgitation. No pericardial effusion.  LEFT HEART CATHETERIZATION (11/13/3):  Aortic systolic pressure 185  Diastolic pressure 90  Left ventricular systolic pressure 181  End-diastolic pressure 27 left main coronary artery normal  Left anterior descending artery normal  Left circumflex coronary artery normal  Right coronary artery dominant and normal   Overall estimated ejection fraction greater than 60% without focal wall motion abnormality  MICROBIOLOGY Endobronchial/Endotracheal Brushing 03/08/17: Rare Haemophilus influenza beta lactamase positive  PATHOLOGY Endobronchial/Endotracheal Brushing 03/08/17: No malignancy. Negative for herpes & HPV.  LABS 05/28/17 BNP:  6.1 ESR:  34 TSH:  1.060 Anti-CCP:  6 RF:  <10  02/16/17 IgE: 81 RAST panel: Cockroach 0.2 / D. farinae 0.2 / D. pteronyssinus 0.24   12/23/16 INR: 1.0 PTT: 24.1 Chromatin Ab:  <0.2 Smith Ab: <0.2 DS DNA Ab:  <1 SSA:  <0.2 SSB:  <0.2 Anti-CCP:  <16 SCL-70:  <0.2 ENA RNP Ab: Centromere Ab Screen:  <0.2 Jo-1 Ab:  <0.2  05/12/16 ANA: Negative Rheumatoid factor:  <10     02/08/2018 acute extended ov/Robin [name] re:  acute on chronic cough x decades / establish with me ([facility] pt) Chief Complaint  Patient presents with  . Acute Visit    Pt c/o cough with yellow, wheezing and SOB x 3 days. She states her chest feels sore when she coughs. She has been using her albuterol inahler 3 x daily on average.   baseline = maint protonix right before first meal then before supper, and singuliar and needing saba  3 x  Times a day but not noct then severe   cough /subj  wheeze/ chest soreness 3 days prior to OV and sob with any activity  rec Prednisone 10 mg take  4 each am x 2 days,   2 each am x 2 days,  1 each am x 2 days and stop  zpak Protonix 40  Mg Take 30- 60 min before your first and last meals of the day  For drainage / throat tickle try take  CHLORPHENIRAMINE  4 mg - take one every 4 hours as needed   Take delsym two tsp every 12 hours and supplement if needed with  tramadol 50 mg up to 2 every 4 hours GERD  Diet   Please schedule a follow up office visit in 4 weeks, sooner if needed  with all medications /inhalers/ solutions in hand so we can verify exactly what you are taking. This includes all medications from all doctors and over the counters      03/08/2018  f/u ov/Robin [name] re:  Cough x decades / did not bring all meds but most of them  Chief Complaint  Patient presents with  . Follow-up    Breathing is unchanged. She is still coughing up yellow sputum. She is using her albuterol inhaler 3 x per wk on average.   Dyspnea:  Even if not coughing doe x room to room  X  Years = MMRC3 = can't walk 100 yards even at a slow pace at a flat grade s stopping due to sob  = stops a lot to shop  Cough: fits of cough are  Sporadic daytime mostly  Sleep: she says fine, husband hears wheezing  SABA use:  Doesn't really help/ note MCT neg  rec Stop corevidol and losartan and instead try bisoprolol 5 mg twice daily  For cough try tessalon 200 mg every 8 hours as needed  For drainage / throat tickle try take CHLORPHENIRAMINE  4 mg - take one every 4 hours as needed   Please see patient coordinator before you leave today  to schedule sinus CT> never done denied by insurance          07/19/2018  Acute extended  ov/Robin [name] re: cough / dysphagia/ globus > 10 y worse since off gabapentin 300 mg x 4 weeks with worse doe also / did not bring updated med calendar as requested  Chief Complaint  Patient presents with  . Acute Visit    Increased SOB and cough x 3-4 wks. Her cough is occ prod with green to yellow sputum.  She also c/o hoarseness.   Dyspnea:   MMRC3 = can't walk 100 yards even at a slow pace at a flat grade s stopping due to sob  - can't do  [employer] Cough:  sporadic Sleeping: sleeps fine bed flat/ 2 pillows  SABA use: none Says  tessalon works the best for cough rec Add back gabapentin 300 mg four times a day as per med calendar Change tessilon to 200 mg up to every 8 hours as needed for cough  and stop delsym If can't stop coughing then add hydrocodone up to every 4 hours if needed  If not better next step is to return to ENT (Wolicki) then if not satisfied please return here  with all medications /inhalers/ solutions     05/11/2019  f/u ov/Robin [name] re: uacs / MO with doe /brought meds but they don't correlate well with med list  Chief Complaint  Patient presents with  . Follow-up    Patient reports that   her sob has increased and she still has a dry cough.   Dyspnea:  Was able to walk at [employer], now using scooter x 2 month Cough: still severe cough day > noct, dry > wet with globus sensatin and hoarseness but still subj "wheezeling"  Sleeping: sleeps ok  in flat bed, several pillows SABA use: no better with saba  02: none  Overt hb despite dexilant / no longer on aciphex though still listed rec Prednisone 10 mg take  4 each am x 2 days,   2 each am x 2 days,  1 each am x 2 days and stop  GERD (REFLUX)   For congestion ok to chlotrabs 4 mg x 2 every hours as needed  We will  schedule ENT eval/ Wolicki re your cough    12/04/2019  f/u ov/Robin [name] re: uacs  Chief Complaint  Patient presents with  . Follow-up    Increased cough and congestion x 4 months. She will occ produce some yellow to green sputum. She is using her albuterol inhaler about 2 x per day on average.   Dyspnea:  Uses walker across the room then stops < 25 ft x months  Cough: esp daytime min mucoid Sleeping: on side flat bed 4 pillows  SABA use: 2 different albuterols/ easily confused with details of care  02: none  rec Prednisone 10 mg take  4 each am x 2 days,   2 each am x 2 days,  1 each am x 2 days and stop  Try dexilant 60 mg   Take 30-60 min before first meal of the day and Pepcid ac (famotidine) 20 mg one after supper until return to office   GERD diet  For congestion ok to chlotrabs 4 mg x 2 every hours as needed  Only use your albuterol as a rescue medication to be used if you can't catch your breath  See  ENT eval/ Wolicki re your cough and ears   01/15/2020  f/u ov/Robin [name] re: uacs/ ? Allergic rhinitis with pnds (w/u by Hicks previously and see last RAST 2018 ) / did not bring chlortabs but says taking 2 bid Chief Complaint  Patient presents with  . Follow-up    Coughing less since the last visit but she states her SOB and wheezing seem worse. She is using her albuterol inhaler at least 3 x per day.   Dyspnea: room to room really no better p pred / more limited by L hip/knee pain Cough: better p prednisone, some worse since completed Sleeping: flat bed with 4 pillows wakes up to br p 3-4 h and cough and wheeze when walking to BR SABA use: during the day with activity (0% effective technique - see a/p)  02: none  rec Work on inhaler technique:  relax and gently blow all the way out then take a nice smooth deep breath back in, triggering the inhaler at same time you start breathing in.  Rinse and gargle with water when done Try albuterol (ventolin) 15 min before an activity that you know would make you short of breath and see if it makes any difference and if makes none then don't take it after activity unless you can't catch your breath. If your breathing gets worse > Prednisone 10 mg take  4 each am x 2 days,   2 each am x 2 days,  1 each am x 2 days and stop  For drainage / throat tickle try take CHLORPHENIRAMINE  4 mg  (  Chlortab '4mg'$   at [employer] should be easiest to find in the green box)  take one-two  every 4 hours as needed - available over the counter- may cause drowsiness so start with just a bedtime dose or two and see how you tolerate it before trying in daytime        04/19/2020  f/u ov/Robin [name] re: sob/cough x 2009 / had both covid  shots Chief Complaint  Patient presents with  . Follow-up  Dyspnea: 25 ft  and stops due to sob / knees hurt  Cough: some worse mostly daytime/ smells trigger esp cig smoke/ sense of pnds / resolves with prednisone short term   Sleeping:  30 degrees elevation hob with electric bed does fine unless needs to get up to pee  SABA use: avg once a day  02: none  rec See Dr [name] or her group and let them know prednisone helps your symptoms Please schedule a follow up visit in 3 months with PFTs on return -  but call sooner if needed    10/19 /21 spirometry restriction, min  obstruction, min curvature of f/v    01/02/2021  f/u ov/Robin [name] re:  No chief complaint on file.   Dyspnea:  *** Cough: *** Sleeping: *** SABA use: *** 02: *** Covid status:   ***   No obvious day to day or daytime variability or assoc excess/ purulent sputum or mucus plugs or hemoptysis or cp or chest tightness, subjective wheeze or overt sinus or hb symptoms.   *** without nocturnal  or early am exacerbation  of respiratory  c/o's or need for noct saba. Also denies any obvious fluctuation of symptoms with weather or environmental changes or other aggravating or alleviating factors except as outlined above   No unusual exposure hx or h/o childhood pna/ asthma or knowledge of premature birth.  Current Allergies, Complete Past Medical History, Past Surgical History, Family History, and Social History were reviewed in [employer] record.  ROS  The following are not active complaints unless bolded Hoarseness, sore throat, dysphagia, dental problems, itching, sneezing,  nasal congestion or discharge of excess mucus or purulent secretions, ear ache,   fever, chills, sweats, unintended wt loss or wt gain, classically pleuritic or exertional cp,  orthopnea pnd or arm/hand swelling  or leg swelling, presyncope, palpitations, abdominal pain, anorexia, nausea, vomiting, diarrhea  or change in bowel habits or change in bladder habits, change in stools or change in urine, dysuria,  hematuria,  rash, arthralgias, visual complaints, headache, numbness, weakness or ataxia or problems with walking or coordination,  change in mood or  memory.        No outpatient medications have been marked as taking for the 01/02/21 encounter (Appointment) with Tanda Rockers, MD.             Objective:   Physical Exam     01/02/2021        *** 04/19/2020         335  01/15/2020         354  12/04/2019           355  05/11/2019           331  07/19/2018       328  03/08/2018            337   02/08/18 (!) 333 lb (151 kg)  01/27/18 (!) 328 lb 9.6 oz (149.1 kg)  12/14/17 (!) 326 lb 6.4 oz (  148.1 kg)    Vital signs reviewed  01/02/2021  - Note at rest 02 sats  ***% on ***   General appearance:    ***       Trace ankle pitting bilaterally***             Assessment:

## 2021-01-03 ENCOUNTER — Ambulatory Visit: Payer: Medicaid Other | Admitting: Physical Therapy

## 2021-01-04 ENCOUNTER — Emergency Department (HOSPITAL_BASED_OUTPATIENT_CLINIC_OR_DEPARTMENT_OTHER): Payer: Medicaid Other

## 2021-01-04 ENCOUNTER — Other Ambulatory Visit: Payer: Self-pay

## 2021-01-04 ENCOUNTER — Emergency Department (HOSPITAL_BASED_OUTPATIENT_CLINIC_OR_DEPARTMENT_OTHER)
Admission: EM | Admit: 2021-01-04 | Discharge: 2021-01-04 | Disposition: A | Discharge status: Home / Self Care | Payer: Medicaid Other | Attending: Emergency Medicine | Admitting: Emergency Medicine

## 2021-01-04 DIAGNOSIS — R059 Cough, unspecified: Secondary | ICD-10-CM | POA: Diagnosis present

## 2021-01-04 DIAGNOSIS — Z7982 Long term (current) use of aspirin: Secondary | ICD-10-CM | POA: Insufficient documentation

## 2021-01-04 DIAGNOSIS — J208 Acute bronchitis due to other specified organisms: Secondary | ICD-10-CM | POA: Diagnosis not present

## 2021-01-04 DIAGNOSIS — R0602 Shortness of breath: Secondary | ICD-10-CM | POA: Diagnosis not present

## 2021-01-04 DIAGNOSIS — R5383 Other fatigue: Secondary | ICD-10-CM

## 2021-01-04 DIAGNOSIS — Z20822 Contact with and (suspected) exposure to covid-19: Secondary | ICD-10-CM | POA: Diagnosis not present

## 2021-01-04 DIAGNOSIS — E039 Hypothyroidism, unspecified: Secondary | ICD-10-CM | POA: Diagnosis not present

## 2021-01-04 DIAGNOSIS — E1169 Type 2 diabetes mellitus with other specified complication: Secondary | ICD-10-CM | POA: Insufficient documentation

## 2021-01-04 DIAGNOSIS — E785 Hyperlipidemia, unspecified: Secondary | ICD-10-CM | POA: Insufficient documentation

## 2021-01-04 DIAGNOSIS — J4541 Moderate persistent asthma with (acute) exacerbation: Secondary | ICD-10-CM | POA: Diagnosis not present

## 2021-01-04 DIAGNOSIS — Z87891 Personal history of nicotine dependence: Secondary | ICD-10-CM | POA: Insufficient documentation

## 2021-01-04 DIAGNOSIS — Z79899 Other long term (current) drug therapy: Secondary | ICD-10-CM | POA: Insufficient documentation

## 2021-01-04 DIAGNOSIS — Z7984 Long term (current) use of oral hypoglycemic drugs: Secondary | ICD-10-CM | POA: Diagnosis not present

## 2021-01-04 DIAGNOSIS — B9689 Other specified bacterial agents as the cause of diseases classified elsewhere: Secondary | ICD-10-CM

## 2021-01-04 LAB — COMPREHENSIVE METABOLIC PANEL
ALT: 35 U/L (ref 0–44)
AST: 32 U/L (ref 15–41)
Albumin: 3.9 g/dL (ref 3.5–5.0)
Alkaline Phosphatase: 53 U/L (ref 38–126)
Anion gap: 8 (ref 5–15)
BUN: 10 mg/dL (ref 8–23)
CO2: 29 mmol/L (ref 22–32)
Calcium: 9.6 mg/dL (ref 8.9–10.3)
Chloride: 100 mmol/L (ref 98–111)
Creatinine, Ser: 0.74 mg/dL (ref 0.44–1.00)
GFR, Estimated: >60 mL/min (ref >60)
Glucose, Bld: 133 mg/dL — ABNORMAL HIGH (ref 70–99)
Potassium: 3.5 mmol/L (ref 3.5–5.1)
Sodium: 137 mmol/L (ref 135–145)
Total Bilirubin: 0.6 mg/dL (ref 0.3–1.2)
Total Protein: 7.9 g/dL (ref 6.5–8.1)

## 2021-01-04 LAB — CBC WITH DIFFERENTIAL/PLATELET
Abs Immature Granulocytes: 0.04 10*3/uL (ref 0.00–0.07)
Basophils Absolute: 0.1 10*3/uL (ref 0.0–0.1)
Basophils Relative: 1 %
Eosinophils Absolute: 0.1 10*3/uL (ref 0.0–0.5)
Eosinophils Relative: 0 %
HCT: 41 % (ref 36.0–46.0)
Hemoglobin: 12.8 g/dL (ref 12.0–15.0)
Immature Granulocytes: 0 %
Lymphocytes Relative: 38 %
Lymphs Abs: 4.6 10*3/uL — ABNORMAL HIGH (ref 0.7–4.0)
MCH: 29.5 pg (ref 26.0–34.0)
MCHC: 31.2 g/dL (ref 30.0–36.0)
MCV: 94.5 fL (ref 80.0–100.0)
Monocytes Absolute: 1.6 10*3/uL — ABNORMAL HIGH (ref 0.1–1.0)
Monocytes Relative: 14 %
Neutro Abs: 5.6 10*3/uL (ref 1.7–7.7)
Neutrophils Relative %: 47 %
Platelets: 411 10*3/uL — ABNORMAL HIGH (ref 150–400)
RBC: 4.34 MIL/uL (ref 3.87–5.11)
RDW: 13 % (ref 11.5–15.5)
WBC: 12 10*3/uL — ABNORMAL HIGH (ref 4.0–10.5)
nRBC: 0 % (ref 0.0–0.2)

## 2021-01-04 LAB — RESP PANEL BY RT-PCR (FLU A&B, COVID) ARPGX2
Influenza A by PCR: NEGATIVE
Influenza B by PCR: NEGATIVE
SARS Coronavirus 2 by RT PCR: NEGATIVE

## 2021-01-04 LAB — BRAIN NATRIURETIC PEPTIDE: B Natriuretic Peptide: 32.5 pg/mL (ref 0.0–100.0)

## 2021-01-04 LAB — TSH: TSH: 1.296 u[IU]/mL (ref 0.350–4.500)

## 2021-01-04 LAB — TROPONIN I (HIGH SENSITIVITY): Troponin I (High Sensitivity): 5 ng/L (ref <18)

## 2021-01-04 LAB — T4, FREE: Free T4: 1 ng/dL (ref 0.61–1.12)

## 2021-01-04 MED ORDER — AZITHROMYCIN 250 MG PO TABS: 250.0000 mg | ORAL_TABLET | Freq: Every day | ORAL | 0 refills | Status: DC

## 2021-01-04 MED ORDER — ALBUTEROL SULFATE HFA 108 (90 BASE) MCG/ACT IN AERS
2.0000 | INHALATION_SPRAY | Freq: Once | RESPIRATORY_TRACT | Status: AC
  Administered 2021-01-04: 2 via RESPIRATORY_TRACT
  Filled 2021-01-04: qty 6.7

## 2021-01-04 MED ORDER — PREDNISONE 10 MG PO TABS: 40.0000 mg | ORAL_TABLET | Freq: Every day | ORAL | 0 refills | Status: AC

## 2021-01-04 MED ORDER — PREDNISONE 50 MG PO TABS
60.0000 mg | ORAL_TABLET | Freq: Once | ORAL | Status: AC
  Administered 2021-01-04: 60 mg via ORAL
  Filled 2021-01-04: qty 1

## 2021-01-04 NOTE — ED Triage Notes (Signed)
Arrived via [facility] with cough x 3 months. BP 142/pal, R 18, O2 Sat 96%RA, P 100, T 97, FSBS 135

## 2021-01-04 NOTE — ED Provider Notes (Signed)
[facility] EMERGENCY DEPARTMENT Provider Note   CSN: [id number] Arrival date & time: 01/04/21  1422     History Chief Complaint  Patient presents with  . Weakness    Robin [name] is a 65 y.o. female.  HPI      65yo female with history of DM, htn, hlpd, hypothyroidism, OSA, asthma, presents with concern for cough, fatigue, has been going on for a long time but progressively worse over last few months.  Had been seeing Dr. Sherene Sires for asthma and persistent cough as well as ENT for persistent cough and allergist.   Husband thought it was CO poisoning, kept telling him she smells something and that triggers the cough. Husband lives in the home but is not sick.  Coughing so much that throat and chest hurts but no other chest pain Has been seeing pulmonology treated for asthma, allergist treating for allergies, has inhalers, nasal sprays but doesn't feel it is helping.  Had to cancel appt due to transportation.   No fever. Has been sneezing.    Past Medical History:  Diagnosis Date  . Anxiety disorder   . Arthritis   . Asthma   . Chronic neck pain   . Constipation   . Depression   . Diabetes mellitus without complication (HCC)   . Dyspnea   . GERD (gastroesophageal reflux disease)   . Hiatal hernia    small  . Hyperlipidemia   . Hypertension   . Hypothyroidism   . Morbid obesity with BMI of 50.0-59.9, adult (HCC)   . Schatzki's ring    non critical  . Sinusitis   . Sleep apnea    CPAP, Sleep study at [facility]  . Sleep apnea     Patient Active Problem List   Diagnosis Date Noted  . Adjustment disorder with mixed disturbance of emotions and conduct 07/28/2020  . Polyp of descending colon 02/15/2020  . Morbid obesity with BMI of 60.0-69.9, adult (HCC) 07/17/2019  . Pedal edema 07/17/2019  . Multilevel degenerative disc disease 10/31/2018  . Abdominal pain 10/28/2018  . Change in bowel function 10/28/2018  . Severe episode of recurrent  major depressive disorder, with psychotic features (HCC) 05/26/2018  . Gastritis and gastroduodenitis 04/26/2018  . Atrophic vaginitis 04/01/2018  . Diabetes mellitus (HCC) 03/31/2018  . Cough, persistent 02/08/2018  . Dysphagia 12/14/2017  . 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 Urinary incontinence 07/23/2017  . Early satiety 06/16/2017  . Hx of iron deficiency anemia 05/28/2017  . Throat pain in adult 04/05/2017  . Prediabetes 02/04/2017  . Dyspnea on exertion 12/23/2016  . 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 Hemoptysis 12/23/2016  . Fibromyalgia 08/18/2016  . Chronic lymphocytic thyroiditis 08/18/2016  . Depression 04/01/2016  . OSA (obstructive sleep apnea) 04/01/2016  . Essential hypertension 12/09/2015  . Hypothyroidism 12/09/2015  . Morbid obesity due to excess calories (HCC) 12/09/2015  . Constipation 05/27/2015  . Fatty liver 05/27/2015  . Abdominal pain, chronic, epigastric 07/04/2012  . Anxiety 06/09/2012  . History of gastroesophageal reflux (GERD) 06/09/2012  . Hyperlipidemia 06/09/2012  . Refusal of blood transfusions as patient is Jehovah's Witness 06/09/2012  . 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 Abnormal small bowel biopsy 09/18/2011  . Lactose intolerance 09/18/2011  . GERD 01/21/2010    Past Surgical History:  Procedure Laterality Date  . ABDOMINAL HYSTERECTOMY    . ABDOMINAL HYSTERECTOMY  02/18/2000  . CARDIAC CATHETERIZATION  08/18/2003   normal  L main/LAD/L Cfx/RCA (Dr. Adora Fridge)  . COLONOSCOPY  2006   Dr. [name], hyperplastic polyps  . COLONOSCOPY  08/06/11   abnormal terminal ileum for 10cm, erosions, geographical ulceration. Bx small bowel mucosa with prominent intramucosal lymphoid aggregates, slightly inflammed  . COLONOSCOPY WITH PROPOFOL N/A 12/28/2019   Rourk: 4 sessile serrated adenomas removed on the colon.  Next colonoscopy in 3 years.  Marland Kitchen DIRECT LARYNGOSCOPY  03/15/2012    Procedure: DIRECT LARYNGOSCOPY;  Surgeon: [name], MD;  Location: [place];  Service: ENT;  Laterality: N/A; Dr. Wolicki--:>no foreign body seen. normal esophagus to 40cm  . ESOPHAGEAL DILATION  04/17/2015   Procedure: ESOPHAGEAL DILATION;  Surgeon: [name], MD;  [facility];  Service: Endoscopy;;  . ESOPHAGOGASTRODUODENOSCOPY  08/23/2008   MHD:QQIW distal esophageal erosions consistent with mild erosive reflux esophagitis, otherwise unremarkable esophagus/ Tiny antral erosions of doubtful clinical significance, otherwise normal stomach, patent pylorus, normal D1 and D2  . ESOPHAGOGASTRODUODENOSCOPY  08/06/11   small hh, noncritical Schatzki's ring s/p 89 F  . ESOPHAGOGASTRODUODENOSCOPY N/A 04/17/2015   LNL:GXQJJH s/p dilation  . ESOPHAGOGASTRODUODENOSCOPY (EGD) WITH PROPOFOL N/A 01/20/2018   Dr. [name]: Erosive gastropathy, normal-appearing esophagus status post empiric dilation.  Chronic gastritis, no H. pylori.  . ESOPHAGOSCOPY  03/15/2012   Procedure: ESOPHAGOSCOPY;  Surgeon: [name], MD;  Location: [place];  Service: ENT;  Laterality: N/A;  . KNEE ARTHROSCOPY  04/27/2001  . MALONEY DILATION N/A 01/20/2018   Procedure: Venia Minks DILATION;  Surgeon: [name], MD;  [facility];  Service: Endoscopy;  Laterality: N/A;  . [place]  2003   negative bruce protocol exercise tress test; EF 68%; intermediate risk study due to evidence of anterior wall ischemia extending from mid-ventricle to apex  . PITUITARY SURGERY  06/2012   benign tumor, [facility]  . POLYPECTOMY  12/28/2019   Procedure: POLYPECTOMY;  Surgeon: [name], MD;  [facility];  Service: Endoscopy;;  . RECTOCELE REPAIR    . TRANSTHORACIC ECHOCARDIOGRAM  2003   EF normal  . VAGINAL PROLAPSE REPAIR    . VIDEO BRONCHOSCOPY Bilateral 03/08/2017   Procedure: VIDEO BRONCHOSCOPY WITHOUT FLUORO;  Surgeon: Javier Glazier, MD;  Location: [place];  Service:  Cardiopulmonary;  Laterality: Bilateral;     OB History    Gravida  4   Para  4   Term  4   Preterm  0   AB  0   Living  4     SAB  0   IAB  0   Ectopic  0   Multiple  0   Live Births  4           Family History  Problem Relation Age of Onset  . Kidney cancer Father   . Hypertension Father   . Hyperlipidemia Father   . Sleep apnea Father   . Diabetes Mother   . Hypertension Mother   . Heart Problems Mother   . Hyperlipidemia Mother   . Asthma Mother   . Sleep apnea Mother   . Liver disease Brother   . Arthritis Brother   . Hypertension Sister   . Allergic rhinitis Sister   . Stomach cancer Other        aunt  . Breast cancer Maternal Grandmother   . Kidney disease Maternal Grandfather   . Breast cancer Paternal Grandmother   . Hypertension Brother   . Hyperlipidemia Brother   .  Hypertension Brother   . Hypertension Sister   . Hyperlipidemia Sister   . Lupus Maternal Aunt   . Eczema Daughter   . Colon cancer Neg Hx   . Immunodeficiency Neg Hx   . Urticaria Neg Hx     Social History   Tobacco Use  . Smoking status: Former Smoker    Packs/day: 0.50    Years: 10.00    Pack years: 5.00    Types: Cigarettes    Start date: 07/14/1975    Quit date: 04/04/1993    Years since quitting: 27.7  . Smokeless tobacco: Never Used  . Tobacco comment: quit 20 yrs ago  Vaping Use  . Vaping Use: Never used  Substance Use Topics  . Alcohol use: No    Alcohol/week: 0.0 standard drinks  . Drug use: No    Home Medications Prior to Admission medications   Medication Sig Start Date End Date Taking? Authorizing Provider  azithromycin (ZITHROMAX) 250 MG tablet Take 1 tablet (250 mg total) by mouth daily. Take first 2 tablets together, then 1 every day until finished. 01/04/21  Yes Alvira Monday, MD  predniSONE (DELTASONE) 10 MG tablet Take 4 tablets (40 mg total) by mouth daily for 4 days. 01/04/21 01/08/21 Yes Alvira Monday, MD  ACCU-CHEK AVIVA PLUS test  strip USE AS DIRECTED 08/08/18   Marcine Matar, MD  acetaminophen (TYLENOL) 500 MG tablet Take 1,000 mg by mouth 2 (two) times daily.     [provider]  albuterol (VENTOLIN HFA) 108 (90 Base) MCG/ACT inhaler Inhale 1-2 puffs into the lungs every 6 (six) hours as needed for wheezing or shortness of breath. 11/22/19   Dahlia Byes A, NP  aspirin EC 81 MG tablet Take 81 mg by mouth at bedtime.    [provider]  atorvastatin (LIPITOR) 40 MG tablet Take 40 mg by mouth daily. 07/24/20   [provider]  azelastine (ASTELIN) 0.1 % nasal spray 1-2 sprays per nostril 2 times daily as needed Patient taking differently: Place 1 spray into both nostrils 2 (two) times daily. 06/17/20   Bobbitt, Heywood Iles, MD  CALCIUM CARBONATE ANTACID PO Take 2 tablets by mouth See admin instructions. Take 2 tablets by mouth every morning, may also take 2 tablets at night as needed for acid reflux    [provider]  celecoxib (CELEBREX) 100 MG capsule TAKE 1 CAPSULE (100 MG TOTAL) BY MOUTH 2 (TWO) TIMES DAILY. 07/25/20 07/25/21  Marcine Matar, MD  CINNAMON PO Take 1 capsule by mouth daily.    [provider]  conjugated estrogens (PREMARIN) vaginal cream Place 1 Applicatorful vaginally daily as needed (vaginal dryness/itching).    [provider]  DEXILANT 60 MG capsule Take 1 capsule by mouth daily. Patient not taking: Reported on 12/12/2020 09/23/20   [provider]  diclofenac sodium (VOLTAREN) 1 % GEL Apply 2-4 g topically 4 (four) times daily as needed (pain).    [provider]  Diclofenac Sodium 3 % GEL SMARTSIG:1-2 Gram(s) Topical 3-4 Times Daily 09/23/20   [provider]  DULoxetine (CYMBALTA) 30 MG capsule TAKE 1 CAPSULE (30 MG TOTAL) BY MOUTH DAILY. 08/06/20 08/06/21  Marcine Matar, MD  esomeprazole (NEXIUM) 40 MG capsule TAKE 1 CAPSULE (40 MG TOTAL) BY MOUTH 2 (TWO) TIMES DAILY BEFORE A MEAL. 05/15/20 05/15/21  Tiffany Kocher, PA-C  famotidine (PEPCID) 20 MG tablet TAKE 1 TABLET (20 MG TOTAL) BY MOUTH 2 (TWO) TIMES DAILY AS NEEDED FOR  HEARTBURN OR INDIGESTION. Patient taking differently: Take 20 mg by mouth 2 (two) times daily. 05/15/20 05/15/21  Mahala Menghini, PA-C  fluticasone (FLONASE) 50 MCG/ACT nasal spray Place 1 spray into both nostrils daily. Patient taking differently: Place 1 spray into both nostrils daily as needed for allergies or rhinitis. 11/22/19   Bast, Tressia Miners A, NP  fluticasone (FLOVENT HFA) 220 MCG/ACT inhaler INHALE 2 INHALATIONS TWICE DAILY Patient taking differently: Inhale 2 puffs into the lungs 2 (two) times daily as needed (shortness of breath/wheezing). 06/17/20 06/17/21  Bobbitt, Sedalia Muta, MD  furosemide (LASIX) 40 MG tablet TAKE 1/2 TO 1 TABLET BY MOUTH DAILY FOR LOWER EXTREMITY SWELLING Patient taking differently: Take 20 mg by mouth daily. 08/08/19   Ladell Pier, MD  gabapentin (NEURONTIN) 300 MG capsule TAKE 2 CAPSULES (600 MG TOTAL) BY MOUTH 2 (TWO) TIMES DAILY. Patient not taking: Reported on 12/12/2020 09/03/20 09/03/21  Tanda Rockers, MD  glucose blood test strip USE AS INSTRUCTED 07/04/20   [provider]  glucose blood test strip USE AS INSTRUCTED 09/03/20   [provider]  glucose blood test strip USE AS INSTRUCTED 07/04/20 07/04/21  Ladell Pier, MD  hydrALAZINE (APRESOLINE) 50 MG tablet TAKE 1 TABLET (50 MG TOTAL) BY MOUTH 3 (THREE) TIMES DAILY. Patient taking differently: Take 50 mg by mouth 2 (two) times daily. 03/07/20 03/07/21  Erlene Quan, PA-C  hydrochlorothiazide (HYDRODIURIL) 25 MG tablet Take 25 mg by mouth daily. 07/09/20   [provider]  levothyroxine (SYNTHROID) 88 MCG tablet TAKE 1 TABLET (88 MCG TOTAL) BY MOUTH DAILY. 12/16/20 12/16/21  Ladell Pier, MD  linaclotide [facility]) 290 MCG CAPS capsule TAKE 1 CAPSULE BY MOUTH DAILY BEFORE BREAKFAST. 11/13/20 11/13/21  Annitta Needs, NP  losartan (COZAAR) 25 MG tablet Take 25  mg by mouth daily. 07/09/20   [provider]  metFORMIN (GLUCOPHAGE) 1000 MG tablet Take 1,000 mg by mouth 2 (two) times daily. Patient not taking: Reported on 12/12/2020 07/24/20   [provider]  metFORMIN (GLUCOPHAGE) 500 MG tablet 2 tabs PO Q a.m and 1 tab in evenings 11/29/20   Ladell Pier, MD  Metoprolol Tartrate 75 MG TABS TAKE 1 TABLET BY MOUTH TWICE DAILY. Patient taking differently: Take 75 mg by mouth 2 (two) times daily. 05/21/20 05/21/21  Ladell Pier, MD  montelukast (SINGULAIR) 10 MG tablet TAKE 1 TABLET BY MOUTH AT BEDTIME. 05/21/20 05/21/21  Ladell Pier, MD  Multiple Vitamin (MULTIVITAMIN WITH MINERALS) TABS tablet Take 1 tablet by mouth daily.     [provider]  NARCAN 4 MG/0.1ML LIQD nasal spray kit SMARTSIG:1 Spray(s) Both Nares Once PRN Patient not taking: Reported on 12/12/2020 08/16/20   [provider]  Omega-3 Fatty Acids (FISH OIL) 1000 MG CAPS Take 1,000 mg by mouth at bedtime.     [provider]  ondansetron (ZOFRAN) 4 MG tablet Take 4 mg by mouth every 8 (eight) hours as needed for nausea or vomiting.  03/17/20   [provider]  OVER THE COUNTER MEDICATION Take 1 capsule by mouth daily. BEET ROOT    [provider]  pregabalin (LYRICA) 25 MG capsule Take 25 mg by mouth 2 (two) times daily.  07/24/20   [provider]  spironolactone (ALDACTONE) 25 MG tablet Take 25 mg by mouth daily.    [provider]  tiZANidine (ZANAFLEX) 4 MG tablet Take 4 mg by mouth 2 (two) times daily as needed for muscle  spasms.    [provider]  TRULICITY 7.20 NO/7.0JG SOPN Inject 0.75 mg into the skin once a week.  07/24/20   [provider]  valACYclovir (VALTREX) 500 MG tablet Take 500 mg by mouth 2 (two) times daily. 04/09/20   [provider]  vitamin B-12 (CYANOCOBALAMIN) 1000 MCG tablet Take 1,000 mcg by mouth daily.    [provider]  Vitamin D,  Ergocalciferol, (DRISDOL) 1.25 MG (50000 UNIT) CAPS capsule Take 50,000 Units by mouth once a week. 07/24/20   [provider]  amitriptyline (ELAVIL) 50 MG tablet TAKE 1 TABLET BY MOUTH AT BEDTIME. 10/15/20 11/29/20  Ladell Pier, MD  buPROPion (WELLBUTRIN XL) 300 MG 24 hr tablet TAKE 1 TABLET (300 MG TOTAL) BY MOUTH EVERY MORNING. Patient taking differently: Take 300 mg by mouth daily. 05/21/20 11/29/20  Ladell Pier, MD    Allergies    Celexa [citalopram hydrobromide], Gabapentin, Norvasc [amlodipine besylate], and Diflucan [fluconazole]  Review of Systems   Review of Systems  Constitutional: Positive for fatigue.  HENT: Positive for congestion, sneezing and sore throat.   Eyes: Negative for visual disturbance.  Respiratory: Positive for cough, shortness of breath and wheezing.   Cardiovascular: Negative for chest pain.  Gastrointestinal: Positive for nausea (sometimes coughing to point of gagging). Negative for abdominal pain and vomiting.  Genitourinary: Negative for dysuria.  Skin: Negative for rash.  Neurological: Positive for headaches.    Physical Exam Updated Vital Signs BP (!) 147/77 (BP Location: Right Arm)   Pulse 72   Temp 98.2 F (36.8 C) (Oral)   Resp 20   Ht $R'5\' 8"'Xz$  (1.727 m)   Wt (!) 152 kg   SpO2 98%   BMI 50.94 kg/m   Physical Exam Vitals and nursing note reviewed.  Constitutional:      General: She is not in acute distress.    Appearance: She is well-developed. She is not diaphoretic.  HENT:     Head: Normocephalic and atraumatic.  Eyes:     Conjunctiva/sclera: Conjunctivae normal.  Cardiovascular:     Rate and Rhythm: Normal rate and regular rhythm.     Heart sounds: Normal heart sounds. No murmur heard. No friction rub. No gallop.   Pulmonary:     Effort: Pulmonary effort is normal. No respiratory distress.     Breath sounds: Wheezing present. No rales.  Abdominal:     General: There is no distension.     Palpations: Abdomen  is soft.     Tenderness: There is no abdominal tenderness. There is no guarding.  Musculoskeletal:        General: No tenderness.     Cervical back: Normal range of motion.  Skin:    General: Skin is warm and dry.     Findings: No erythema or rash.  Neurological:     Mental Status: She is alert and oriented to person, place, and time.     ED Results / Procedures / Treatments   Labs (all labs ordered are listed, but only abnormal results are displayed) Labs Reviewed  CBC WITH DIFFERENTIAL/PLATELET - Abnormal; Notable for the following components:      Result Value   WBC 12.0 (*)    Platelets 411 (*)    Lymphs Abs 4.6 (*)    Monocytes Absolute 1.6 (*)    All other components within normal limits  COMPREHENSIVE METABOLIC PANEL - Abnormal; Notable for the following components:   Glucose, Bld 133 (*)    All  other components within normal limits  RESP PANEL BY RT-PCR (FLU A&B, COVID) ARPGX2  BRAIN NATRIURETIC PEPTIDE  TSH  T4, FREE  TROPONIN I (HIGH SENSITIVITY)  TROPONIN I (HIGH SENSITIVITY)    EKG EKG Interpretation  Date/Time:  Saturday January 04 2021 15:10:41 EDT Ventricular Rate:  62 PR Interval:  178 QRS Duration: 88 QT Interval:  428 QTC Calculation: 434 R Axis:   81 Text Interpretation: Normal sinus rhythm Nonspecific T wave abnormality Abnormal ECG No significant change since last tracing Confirmed by Gareth Morgan [phone]) on 01/04/2021 4:28:51 PM   Radiology DG Chest 2 View  Result Date: 01/04/2021 CLINICAL DATA:  Shortness of breath. EXAM: CHEST - 2 VIEW COMPARISON:  Chest x-ray 07/29/2020, CT heart 06/09/2018 FINDINGS: The heart size and mediastinal contours are within normal limits. No focal consolidation. No pulmonary edema. No pleural effusion. No pneumothorax. No acute osseous abnormality. IMPRESSION: No active cardiopulmonary disease. Electronically Signed   By: Iven Finn M.D.   On: 01/04/2021 15:50    Procedures Procedures   Medications  Ordered in ED Medications  albuterol (VENTOLIN HFA) 108 (90 Base) MCG/ACT inhaler 2 puff (2 puffs Inhalation Given 01/04/21 1818)  predniSONE (DELTASONE) tablet 60 mg (60 mg Oral Given 01/04/21 1840)    ED Course  I have reviewed the triage vital signs and the nursing notes.  Pertinent labs & imaging results that were available during my care of the patient were reviewed by me and considered in my medical decision making (see chart for details).    MDM Rules/Calculators/A&P                          65yo female with history of DM, htn, hlpd, hypothyroidism, OSA, asthma, presents with concern for cough, fatigue, has been going on for a long time but progressively worse over last few months.  Has seen pulmonology, ENT, allergist.    Differential diagnosis for dyspnea includes ACS, PE, COPD/asthma exacerbation, CHF exacerbation, anemia, pneumonia, viral etiology such as COVID 19 infection, metabolic abnormality.  Chest x-ray was done which showed no acute abnormalities. EKG was evaluated by me which showed no acute abnormalities.  BNP was WNL.  Patient is low risk [place] and given chronicity of symptoms and progressive worsening have low suspicion for PE and do not feel she requires transfer to another facility to obtain a PE study.  Denies CP, troponin negative, doubt ACS.  Does describe increasing cough and wheezing and has some wheezing on exam and suspect likely asthma exacerbation and given duration of symptoms feel bacterial bronchitis also possible. Given steroids, azithromycin. Patient discharged in stable condition with understanding of reasons to return.   Final Clinical Impression(s) / ED Diagnoses Final diagnoses:  Other fatigue  Moderate persistent asthma with exacerbation  Acute bacterial bronchitis    Rx / DC Orders ED Discharge Orders         Ordered    predniSONE (DELTASONE) 10 MG tablet  Daily        01/04/21 1811    azithromycin (ZITHROMAX) 250 MG tablet  Daily         01/04/21 1811           Gareth Morgan, MD 01/05/21 1156

## 2021-01-04 NOTE — ED Notes (Signed)
Pt made aware of AMA process

## 2021-01-04 NOTE — ED Provider Notes (Signed)
MSE was initiated and I personally evaluated the patient and placed orders (if any) at  3:17 PM on January 04, 2021.  The patient appears stable so that the remainder of the MSE may be completed by another provider.  65 year old female presents with concern for generalized weakness, fatigue, and cough for 3 months.  Triage orders placed.   Gareth Morgan, MD 01/04/21 1517

## 2021-01-04 NOTE — ED Triage Notes (Addendum)
Pt reports cough and not feeling well for "3 or 4 months". She states her husband thinks it may be due to carbon monoxide exposure. Pt cao x 4. Alert. Also reports dizziness

## 2021-01-08 ENCOUNTER — Other Ambulatory Visit: Payer: Self-pay

## 2021-01-08 ENCOUNTER — Encounter: Payer: Self-pay | Admitting: Physical Therapy

## 2021-01-08 ENCOUNTER — Ambulatory Visit: Payer: Medicaid Other | Attending: Internal Medicine | Admitting: Physical Therapy

## 2021-01-08 DIAGNOSIS — M6281 Muscle weakness (generalized): Secondary | ICD-10-CM | POA: Diagnosis not present

## 2021-01-08 DIAGNOSIS — R278 Other lack of coordination: Secondary | ICD-10-CM | POA: Diagnosis present

## 2021-01-08 DIAGNOSIS — M25652 Stiffness of left hip, not elsewhere classified: Secondary | ICD-10-CM | POA: Diagnosis present

## 2021-01-08 DIAGNOSIS — R262 Difficulty in walking, not elsewhere classified: Secondary | ICD-10-CM | POA: Diagnosis present

## 2021-01-08 DIAGNOSIS — R2681 Unsteadiness on feet: Secondary | ICD-10-CM | POA: Insufficient documentation

## 2021-01-08 DIAGNOSIS — M25651 Stiffness of right hip, not elsewhere classified: Secondary | ICD-10-CM | POA: Diagnosis present

## 2021-01-08 NOTE — Patient Outreach (Signed)
Medicaid Managed Care    Pharmacy Note  01/08/2021 Name: Robin [name] MRN: [id number] DOB: 1956-03-05  Robin [name] is a 65 y.o. year old female who is a primary care patient of Ladell Pier, MD. The [facility] Managed Care Coordination team was consulted for assistance with disease management and care coordination needs.    Engaged with patient Engaged with patient by telephone for follow up visit in response to referral for case management and/or care coordination services.  Ms. Kovacevic was given information about Managed Medicaid Care Coordination team services today. Robin [name] agreed to services and verbal consent obtained.   Objective:  Lab Results  Component Value Date   CREATININE 0.74 01/04/2021   CREATININE 0.91 07/27/2020   CREATININE 0.76 12/26/2019    Lab Results  Component Value Date   HGBA1C 6.7 12/02/2020       Component Value Date/Time   CHOL 141 10/25/2019 1038   TRIG 58 10/25/2019 1038   HDL 58 10/25/2019 1038   CHOLHDL 2.4 10/25/2019 1038   CHOLHDL 2.3 12/16/2015 0950   VLDL 13 12/16/2015 0950   LDLCALC 71 10/25/2019 1038    Other: (TSH, CBC, Vit D, etc.)  Clinical ASCVD: Yes  The ASCVD Risk score Mikey Bussing DC Jr., et al., 2013) failed to calculate for the following reasons:   The valid total cholesterol range is 130 to 320 mg/dL    Other: (CHADS2VASc if Afib, PHQ9 if depression, MMRC or CAT for COPD, ACT, DEXA)  BP Readings from Last 3 Encounters:  01/04/21 (!) 147/77  11/29/20 98/68  07/30/20 (!) 147/66    Assessment/Interventions: Review of patient past medical history, allergies, medications, health status, including review of consultants reports, laboratory and other test data, was performed as part of comprehensive evaluation and provision of chronic care management services.   Mood Depression screen [facility] 2/9 11/29/2020 10/15/2020 09/25/2020  Decreased Interest 3 3 2   Down, Depressed, Hopeless 2 2 1   PHQ - 2 Score 5 5 3   Altered  sleeping 2 3 1   Tired, decreased energy 3 3 3   Change in appetite 2 3 2   Feeling bad or failure about yourself  2 3 0  Trouble concentrating 1 1 3   Moving slowly or fidgety/restless 3 2 2   Suicidal thoughts 0 0 0  PHQ-9 Score 18 20 14   Difficult doing work/chores - Somewhat difficult Not difficult at all  Some recent data might be hidden   Duloxetine 30mg  QD Tried/Failed: Bupropion 300mg  XL AM, Amitriptyline HS Plan: Unsure why patient was removed from meds, will f/u with specialist/PCP next month    GERD Esomeprazole Dexlansoprazole Plan: Told to only take 1.  Cholesterol Atorvastatin 10mg  Plan: Candidate for High Intensity statin, will ask PCP  Pain Both knees and both hips Pain Scale: 09/10/20: 8/10. Has days when she can't do things, can't walk due to it 01/08/21: 8/10 Oxy/Acetaminophen 10mg -325 BID Lyrica Tried/Failed: Gabapentin 300mg  QID (Edema), Tizanidine (No more refills) Dec 2021 Plan: Water aerobics, can't afford it.  April 2022: Unsure why patient's therapy was removed, will f/u with PCP   Thyroid Levothyroxine 68mcg QD Plan: At goal,  patient stable/ symptoms controlled   IBS Linzess 290mg  QD Plan: At goal,  patient stable/ symptoms controlled   DM Metformin  1000mg  BID Trulicity Plan: Unsure why Trulicity was added due to A1c, will inquire further  Cardio Home BP:  December 2021: 138/65 April 2022: 135/60's, 120's/60's, never over 277 systolic Spironolactone 25mg  QD Furosemide 40mg  Hydralazine  Losartan Metoprolol Plan: At goal,  patient stable/ symptoms controlled   Allergies Chlorpheniramine Fluticasone nasal spray Monteleukast 10mg  Plan: At goal,  patient stable/ symptoms controlled  Meds: Patient's meds are delivered but she is taking 2 PPI's and is very confused about when to take meds, would like her meds to be packaged Plan: Verbal consent obtained for [facility] enhanced pharmacy services (medication synchronization,  adherence packaging, delivery coordination). A medication sync plan was created to allow patient to get all medications delivered once every 30 to 90 days per patient preference. Patient understands they have freedom to choose pharmacy and clinical pharmacist will coordinate care between all prescribers and [facility].     SDOH (Social Determinants of Health) assessments and interventions performed:    Care Plan  Allergies  Allergen Reactions  . Celexa [Citalopram Hydrobromide] Other (See Comments)    "Made me feel out of it"  . Gabapentin Other (See Comments)    Contributed to lower extremity edema  . Norvasc [Amlodipine Besylate] Other (See Comments)    Edema in lower extremities  . Diflucan [Fluconazole] Rash    Medications Reviewed Today    Reviewed by Lane Hacker, [facility] (Pharmacist) on 01/08/21 at Friend List Status: <None>  Medication Order Taking? Sig Documenting Provider Last Dose Status Informant  ACCU-CHEK AVIVA PLUS test strip [id number] Yes USE AS DIRECTED Ladell Pier, MD Taking Active Multiple Informants  acetaminophen (TYLENOL) 500 MG tablet [id number] Yes Take 1,000 mg by mouth 2 (two) times daily.  [provider] Taking Active Multiple Informants  albuterol (VENTOLIN HFA) 108 (90 Base) MCG/ACT inhaler [id number] Yes Inhale 1-2 puffs into the lungs every 6 (six) hours as needed for wheezing or shortness of breath. Orvan July, NP Taking Active Multiple Informants        Discontinued 11/29/20 1619   aspirin EC 81 MG tablet [id number] Yes Take 81 mg by mouth at bedtime. [provider] Taking Active Multiple Informants  atorvastatin (LIPITOR) 40 MG tablet [id number] Yes Take 40 mg by mouth daily. [provider] Taking Active Multiple Informants  azelastine (ASTELIN) 0.1 % nasal spray [id number] Yes 1-2 sprays per nostril 2 times daily as needed  Patient taking differently: Place 1 spray into both nostrils 2 (two) times daily.    Bobbitt, Sedalia Muta, MD Taking Active   azithromycin [facility]) 250 MG tablet [id number] No Take 1 tablet (250 mg total) by mouth daily. Take first 2 tablets together, then 1 every day until finished.  Patient not taking: Reported on 01/08/2021   Gareth Morgan, MD Not Taking Consider Medication Status and Discontinue        Patient taking differently:      Discontinued 11/29/20 1620   CALCIUM CARBONATE ANTACID PO [id number] No Take 2 tablets by mouth See admin instructions. Take 2 tablets by mouth every morning, may also take 2 tablets at night as needed for acid reflux  Patient not taking: Reported on 01/08/2021   [provider] Not Taking Consider Medication Status and Discontinue Multiple Informants           Med Note Lane Hacker   Tue Sep 10, 2020  2:30 PM) Tapering off  celecoxib (CELEBREX) 100 MG capsule [id number] Yes TAKE 1 CAPSULE (100 MG TOTAL) BY MOUTH 2 (TWO) TIMES DAILY. Ladell Pier, MD Taking Active   CINNAMON PO [id number] No Take 1 capsule by mouth daily.  Patient not taking: Reported on 01/08/2021   [provider] Not  Taking Consider Medication Status and Discontinue Multiple Informants  conjugated estrogens (PREMARIN) vaginal cream [id number] No Place 1 Applicatorful vaginally daily as needed (vaginal dryness/itching).  Patient not taking: Reported on 01/08/2021   [provider] Not Taking Consider Medication Status and Discontinue Multiple Informants  DEXILANT 60 MG capsule [id number] No Take 1 capsule by mouth daily.  Patient not taking: No sig reported   [provider] Not Taking Consider Medication Status and Discontinue   diclofenac sodium (VOLTAREN) 1 % GEL [id number] No Apply 2-4 g topically 4 (four) times daily as needed (pain).  Patient not taking: Reported on 01/08/2021   [provider] Not Taking Consider Medication Status and Discontinue Multiple Informants           Med Note (ROBB, MELANIE A   Tue Sep 24, 2020 11:12 AM)    Diclofenac Sodium 3 % GEL [id number] Yes SMARTSIG:1-2 Gram(s) Topical 3-4 Times Daily [provider] Taking Active   DULoxetine (CYMBALTA) 30 MG capsule [id number] Yes TAKE 1 CAPSULE (30 MG TOTAL) BY MOUTH DAILY. Marcine Matar, MD Taking Active   esomeprazole (NEXIUM) 40 MG capsule [id number] Yes TAKE 1 CAPSULE (40 MG TOTAL) BY MOUTH 2 (TWO) TIMES DAILY BEFORE A MEAL. Tiffany Kocher, PA-C Taking Active   famotidine (PEPCID) 20 MG tablet [id number] No TAKE 1 TABLET (20 MG TOTAL) BY MOUTH 2 (TWO) TIMES DAILY AS NEEDED FOR HEARTBURN OR INDIGESTION.  Patient not taking: Reported on 01/08/2021   Tiffany Kocher, PA-C Not Taking Consider Medication Status and Discontinue   fluticasone (FLONASE) 50 MCG/ACT nasal spray [id number] Yes Place 1 spray into both nostrils daily.  Patient taking differently: Place 1 spray into both nostrils daily as needed for allergies or rhinitis.   Dahlia Byes A, NP Taking Active   fluticasone (FLOVENT HFA) 220 MCG/ACT inhaler [id number] Yes INHALE 2 INHALATIONS TWICE DAILY  Patient taking differently: Inhale 2 puffs into the lungs 2 (two) times daily as needed (shortness of breath/wheezing).   Bobbitt, Heywood Iles, MD Taking Active            Med Note Gorden Harms   JKY Jul 27, 2020  4:36 PM) Pt states that she only took once - when she could not find her albuterol  furosemide (LASIX) 40 MG tablet [id number] Yes TAKE 1/2 TO 1 TABLET BY MOUTH DAILY FOR LOWER EXTREMITY SWELLING  Patient taking differently: Take 20 mg by mouth daily.   Marcine Matar, MD Taking Active   gabapentin (NEURONTIN) 300 MG capsule [id number] No TAKE 2 CAPSULES (600 MG TOTAL) BY MOUTH 2 (TWO) TIMES DAILY.  Patient not taking: No sig reported   Nyoka Cowden, MD Not Taking Consider Medication Status and Discontinue            Med Note Zettie Pho   Tue Sep 10, 2020  2:32 PM) Told to stop due to interaction with Lyrica  glucose blood test strip [id number] Yes  USE AS INSTRUCTED [provider] Taking Active Multiple Informants  glucose blood test strip [id number] No USE AS INSTRUCTED  Patient not taking: Reported on 01/08/2021   [provider] Not Taking Consider Medication Status and Discontinue   glucose blood test strip [id number] No USE AS INSTRUCTED  Patient not taking: Reported on 01/08/2021   Marcine Matar, MD Not Taking Consider Medication Status and Discontinue   hydrALAZINE (APRESOLINE) 50 MG tablet [id number] Yes TAKE 1 TABLET (50 MG TOTAL) BY MOUTH 3 (THREE) TIMES  DAILY.  Patient taking differently: Take 50 mg by mouth 2 (two) times daily.   Erlene Quan, PA-C Taking Active            Med Note Lane Hacker   Tue Sep 10, 2020  2:23 PM) Confirmed she's taking BID  hydrochlorothiazide (HYDRODIURIL) 25 MG tablet [id number] Yes Take 25 mg by mouth daily. [provider] Taking Active Multiple Informants           Med Note Elizbeth Squires Sep 10, 2020  2:24 PM)    levothyroxine (SYNTHROID) 88 MCG tablet [id number] Yes TAKE 1 TABLET (88 MCG TOTAL) BY MOUTH DAILY. Ladell Pier, MD Taking Active   linaclotide Rolan Lipa) 290 MCG CAPS capsule [id number] Yes TAKE 1 CAPSULE BY MOUTH DAILY BEFORE BREAKFAST. Annitta Needs, NP Taking Active   losartan (COZAAR) 25 MG tablet [id number] Yes Take 25 mg by mouth daily. [provider] Taking Active Multiple Informants  metFORMIN (GLUCOPHAGE) 1000 MG tablet [id number] Yes Take 1,000 mg by mouth 2 (two) times daily. [provider] Taking Active   metFORMIN (GLUCOPHAGE) 500 MG tablet [id number] No 2 tabs PO Q a.m and 1 tab in evenings  Patient not taking: Reported on 01/08/2021   Ladell Pier, MD Not Taking Consider Medication Status and Discontinue   Metoprolol Tartrate 75 MG TABS [id number] Yes TAKE 1 TABLET BY MOUTH TWICE DAILY.  Patient taking differently: Take 75 mg by mouth 2 (two) times daily.   Ladell Pier, MD Taking Active    montelukast (SINGULAIR) 10 MG tablet [id number] Yes TAKE 1 TABLET BY MOUTH AT BEDTIME. Ladell Pier, MD Taking Active            Med Note Lane Hacker   Tue Sep 10, 2020  2:19 PM)    Multiple Vitamin (MULTIVITAMIN WITH MINERALS) TABS tablet [id number]  Take 1 tablet by mouth daily.  [provider]  Active Multiple Informants  NARCAN 4 MG/0.1ML LIQD nasal spray kit [id number] No SMARTSIG:1 Spray(s) Both Nares Once PRN  Patient not taking: No sig reported   [provider] Not Taking Active   Omega-3 Fatty Acids (FISH OIL) 1000 MG CAPS [id number] Yes Take 1,000 mg by mouth at bedtime.  [provider] Taking Active Multiple Informants  ondansetron (ZOFRAN) 4 MG tablet [id number] No Take 4 mg by mouth every 8 (eight) hours as needed for nausea or vomiting.   Patient not taking: Reported on 01/08/2021   [provider] Not Taking Consider Medication Status and Discontinue Multiple Informants           Med Note Lane Hacker   Tue Sep 10, 2020  2:22 PM) PRN  OVER THE COUNTER MEDICATION [id number] No Take 1 capsule by mouth daily. BEET ROOT  Patient not taking: Reported on 01/08/2021   [provider] Not Taking Consider Medication Status and Discontinue Multiple Informants  predniSONE (DELTASONE) 10 MG tablet [id number] No Take 4 tablets (40 mg total) by mouth daily for 4 days.  Patient not taking: Reported on 01/08/2021   Gareth Morgan, MD Not Taking Consider Medication Status and Discontinue   pregabalin (LYRICA) 25 MG capsule [id number] No Take 25 mg by mouth 2 (two) times daily.   Patient not taking: Reported on 01/08/2021   [provider] Not Taking Consider Medication Status and Discontinue Multiple Informants           Med Note Merrilyn Puma, Anam Bobby K   Tue  Sep 10, 2020  2:27 PM) Patient is taking Pregabalin, she didn't realize this was Lyrica  spironolactone (ALDACTONE) 25 MG tablet [id number] Yes Take 25 mg by mouth daily. [provider] Taking Active   tiZANidine (ZANAFLEX) 4 MG tablet [id number] No Take 4 mg by mouth 2 (two) times daily as needed for muscle spasms.  Patient not taking: Reported on 01/08/2021   [provider] Not Taking Consider Medication Status and Discontinue Multiple Informants  TRULICITY 5.27 PO/2.4MP SOPN [id number] Yes Inject 0.75 mg into the skin once a week.  [provider] Taking Active Multiple Informants           Med Note Payton Doughty   QMG Jul 27, 2020  4:34 PM) New med 07/24/2020 - not yet taken  valACYclovir (VALTREX) 500 MG tablet [id number] No Take 500 mg by mouth 2 (two) times daily.  Patient not taking: Reported on 01/08/2021   [provider] Not Taking Consider Medication Status and Discontinue            Med Note Payton Doughty   Sat Jul 27, 2020  4:46 PM) 7 day course filled 03/17/2020 and 04/09/2020  vitamin B-12 (CYANOCOBALAMIN) 1000 MCG tablet [id number] No Take 1,000 mcg by mouth daily.  Patient not taking: Reported on 01/08/2021   [provider] Not Taking Consider Medication Status and Discontinue Multiple Informants  Vitamin D, Ergocalciferol, (DRISDOL) 1.25 MG (50000 UNIT) CAPS capsule [id number] Yes Take 50,000 Units by mouth once a week. [provider] Taking Active Multiple Informants           Med Note Payton Doughty   KNL Jul 27, 2020  4:35 PM) New med 07/24/2020 - not yet taken          Patient Active Problem List   Diagnosis Date Noted  . Adjustment disorder with mixed disturbance of emotions and conduct 07/28/2020  . Polyp of descending colon 02/15/2020  . Morbid obesity with BMI of 60.0-69.9, adult ([place]) 07/17/2019  . Pedal edema 07/17/2019  . Multilevel degenerative disc disease 10/31/2018  . Abdominal pain 10/28/2018  . Change in bowel function 10/28/2018  . Severe episode of recurrent major depressive disorder, with psychotic features ([place]) 05/26/2018  . Gastritis and gastroduodenitis  04/26/2018  . Atrophic vaginitis 04/01/2018  . Diabetes mellitus ([place]) 03/31/2018  . Cough, persistent 02/08/2018  . Dysphagia 12/14/2017  . 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 Urinary incontinence 07/23/2017  . Early satiety 06/16/2017  . Hx of iron deficiency anemia 05/28/2017  . Throat pain in adult 04/05/2017  . Prediabetes 02/04/2017  . Dyspnea on exertion 12/23/2016  . 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 Hemoptysis 12/23/2016  . Fibromyalgia 08/18/2016  . Chronic lymphocytic thyroiditis 08/18/2016  . Depression 04/01/2016  . OSA (obstructive sleep apnea) 04/01/2016  . Essential hypertension 12/09/2015  . Hypothyroidism 12/09/2015  . Morbid obesity due to excess calories ([place]) 12/09/2015  . Constipation 05/27/2015  . Fatty liver 05/27/2015  . Abdominal pain, chronic, epigastric 07/04/2012  . Anxiety 06/09/2012  . History of gastroesophageal reflux (GERD) 06/09/2012  . Hyperlipidemia 06/09/2012  . Refusal of blood transfusions as patient is Jehovah's Witness 06/09/2012  . 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 Abnormal small bowel biopsy 09/18/2011  . Lactose intolerance 09/18/2011  . GERD 01/21/2010    Conditions to be addressed/monitored: HTN, Hypertriglyceridemia and DM  Care Plan : Medication Management  Updates made by Lane Hacker, [place] since 01/08/2021  12:00 AM    Problem: Medication Management   Note:   Current Barriers:  . Unable to independently afford treatment regimen . Unable to self administer medications as prescribed . Does not adhere to prescribed medication regimen . Does not maintain contact with provider office . Does not contact provider office for questions/concerns .   Pharmacist Clinical Goal(s):  Marland Kitchen Over the next 30 days, patient will contact provider office for questions/concerns as evidenced notation of same in electronic health record through collaboration with PharmD and  provider.  .   Interventions: . Inter-disciplinary care team collaboration (see longitudinal plan of care) . Comprehensive medication review performed; medication list updated in electronic medical record  @RXCPDIABETES @ @RXCPHYPERTENSION @ @RXCPHYPERLIPIDEMIA @ @RXCPASTHMA @ @RXCPMENTALHEALTH @  Patient Goals/Self-Care Activities . Over the next 30 days, patient will:  - take medications as prescribed collaborate with provider on medication access solutions  Follow Up Plan: The care management team will reach out to the patient again over the next 30 days.     Goal: Self-Management Plan Developed   Note:   Evidence-based guidance:   Review biopsychosocial determinants of health screens.   Determine level of modifiable health risk.   Assess level of patient activation, level of readiness, importance and confidence to make changes.   Evoke change talk using open-ended questions, pros and cons, as well as looking forward.   Identify areas where behavior change may lead to improved health.   Partner with patient to develop a robust self-management plan that includes lifestyle factors, such as weight loss, exercise and healthy nutrition, as well as goals specific to disease risks.   Support patient and family/caregiver active participation in decision-making and self-management plan.   Implement additional goals and interventions based on identified risk factors to reduce health risk.   Facilitate advance care planning.   Review need for preventive screening based on age, sex, family history and health history.   Notes:    Task: Mutually Develop and Foster Achievement of Patient Goals   Note:   Care Management Activities:    - verbalization of feelings encouraged    Notes:      Medication Assistance: Utilizing Enhanced Pharmacy Services (Packaging)  Follow up: Agree/   Plan: The care management team will reach out to the patient again over the next 30 days.    [place] Constable, Pharm.D., Managed Medicaid Pharmacist - [phone]

## 2021-01-08 NOTE — Therapy (Signed)
[facility] [address], [place], [place], [address] Phone: [phone]   Fax:  [phone]  Physical Therapy Treatment  Patient Details  Name: Robin [name] MRN: [id number] Date of Birth: August 15, 1956 Referring Provider (PT): Karle Plumber, MD   Encounter Date: 01/08/2021   PT End of Session - 01/08/21 0945    Visit Number 5    Date for PT Re-Evaluation 02/19/21    Authorization Type Medicaid: submitted authorization for visits    Authorization Time Period 12 visits, auth time period 12/11/20-01/21/21    Authorization - Visit Number 4    Authorization - Number of Visits 12    PT Start Time [phone]   Pt late   PT Stop Time 1015    PT Time Calculation (min) 38 min    Activity Tolerance Patient limited by pain    Behavior During Therapy [facility] for tasks assessed/performed           Past Medical History:  Diagnosis Date  . Anxiety disorder   . Arthritis   . Asthma   . Chronic neck pain   . Constipation   . Depression   . Diabetes mellitus without complication ([place])   . Dyspnea   . GERD (gastroesophageal reflux disease)   . Hiatal hernia    small  . Hyperlipidemia   . Hypertension   . Hypothyroidism   . Morbid obesity with BMI of 50.0-59.9, adult ([place])   . Schatzki's ring    non critical  . Sinusitis   . Sleep apnea    CPAP, Sleep study at [employer]  . Sleep apnea     Past Surgical History:  Procedure Laterality Date  . ABDOMINAL HYSTERECTOMY    . ABDOMINAL HYSTERECTOMY  02/18/2000  . CARDIAC CATHETERIZATION  08/18/2003   normal L main/LAD/L Cfx/RCA (Dr. Adora Fridge)  . COLONOSCOPY  2006   Dr. [name], hyperplastic polyps  . COLONOSCOPY  08/06/11   abnormal terminal ileum for 10cm, erosions, geographical ulceration. Bx small bowel mucosa with prominent intramucosal lymphoid aggregates, slightly inflammed  . COLONOSCOPY WITH PROPOFOL N/A 12/28/2019   Rourk: 4 sessile serrated adenomas  removed on the colon.  Next colonoscopy in 3 years.  Marland Kitchen DIRECT LARYNGOSCOPY  03/15/2012   Procedure: DIRECT LARYNGOSCOPY;  Surgeon: [name], MD;  Location: [place] [facility];  Service: ENT;  Laterality: N/A; Dr. Wolicki--:>no foreign body seen. normal esophagus to 40cm  . ESOPHAGEAL DILATION  04/17/2015   Procedure: ESOPHAGEAL DILATION;  Surgeon: [name], MD;  [facility];  Service: Endoscopy;;  . ESOPHAGOGASTRODUODENOSCOPY  08/23/2008   ZDG:LOVF distal esophageal erosions consistent with mild erosive reflux esophagitis, otherwise unremarkable esophagus/ Tiny antral erosions of doubtful clinical significance, otherwise normal stomach, patent pylorus, normal D1 and D2  . ESOPHAGOGASTRODUODENOSCOPY  08/06/11   small hh, noncritical Schatzki's ring s/p 24 F  . ESOPHAGOGASTRODUODENOSCOPY N/A 04/17/2015   IEP:PIRJJO s/p dilation  . ESOPHAGOGASTRODUODENOSCOPY (EGD) WITH PROPOFOL N/A 01/20/2018   Dr. [name]: Erosive gastropathy, normal-appearing esophagus status post empiric dilation.  Chronic gastritis, no H. pylori.  . ESOPHAGOSCOPY  03/15/2012   Procedure: ESOPHAGOSCOPY;  Surgeon: [name], MD;  Location: [place];  Service: ENT;  Laterality: N/A;  . KNEE ARTHROSCOPY  04/27/2001  . MALONEY DILATION N/A 01/20/2018   Procedure: Venia Minks DILATION;  Surgeon: [name], MD;  [facility];  Service: Endoscopy;  Laterality: N/A;  . [place] WALL MOTION  2003  negative bruce protocol exercise tress test; EF 68%; intermediate risk study due to evidence of anterior wall ischemia extending from mid-ventricle to apex  . PITUITARY SURGERY  06/2012   benign tumor, surgeon at [facility]  . POLYPECTOMY  12/28/2019   Procedure: POLYPECTOMY;  Surgeon: [name], MD;  [facility];  Service: Endoscopy;;  . RECTOCELE REPAIR    . TRANSTHORACIC ECHOCARDIOGRAM  2003   EF normal  . VAGINAL PROLAPSE REPAIR    . VIDEO BRONCHOSCOPY Bilateral 03/08/2017   Procedure: VIDEO  BRONCHOSCOPY WITHOUT FLUORO;  Surgeon: Javier Glazier, MD;  Location: [place];  Service: Cardiopulmonary;  Laterality: Bilateral;    There were no vitals filed for this visit.   Subjective Assessment - 01/08/21 0941    Subjective I am 30% better since starting PT.  The weather gives me pain.  I try to do my seated HEP every day or every other day depending on my pain.  I am trying to walk within my home every other day.  I am limited by my breathing, pain and some weakness.    Pertinent History use of motorized W/C due to weakness and LE pain.    Limitations Standing;Walking    How long can you stand comfortably? 5 min max    How long can you walk comfortably? 20-25 steps with walker    Patient Stated Goals get stronger, walk more, stand longer    Currently in Pain? Yes    Pain Score 7     Pain Location Knee    Pain Orientation Right;Left;Anterior    Pain Descriptors / Indicators Aching    Pain Type Chronic pain    Pain Onset More than a month ago    Aggravating Factors  stand, walk    Pain Relieving Factors meds, HEP              [facility] PT Assessment - 01/08/21 0001      Assessment   Medical Diagnosis physical deconditioning    Referring Provider (PT) Karle Plumber, MD      Posture/Postural Control   Posture/Postural Control Postural limitations    Postural Limitations Forward head;Rounded Shoulders;Flexed trunk    Posture Comments bil knee flexion in standing      Strength   Overall Strength Comments bil hips in sitting 4+/5, Lt knee 4+/5, Rt knee 4/5, ankle DF 4/5 bil      Ambulation/Gait   Ambulation/Gait Yes    Ambulation/Gait Assistance 5: Supervision    Ambulation Distance (Feet) 30 Feet   2x30' with seated ther ex between   Assistive device Rolling walker    Gait Comments 2x30' with seated ther ex between                         [facility] Adult PT Treatment/Exercise - 01/08/21 0001      Self-Care   Self-Care Other Self-Care Comments     Other Self-Care Comments  review of goals and PT counseled Pt/encouraged Pt to work on daily ambulation vs every other day household amb, with RW, and standing tolerance with posture focus      Knee/Hip Exercises: Standing   Gait Training standing erect with RW, VC to use gluts and quads for more erect posture, 2' weight shifting lateral and stagger stance      Knee/Hip Exercises: Seated   Long Arc Quad Both;2 sets;10 reps    Other Seated Knee/Hip Exercises Ankle DF/PF 20x  Marching 20 reps;2 sets    Sit to Sand 5 reps;with UE support                    PT Short Term Goals - 01/08/21 0946      PT SHORT TERM GOAL #1   Title independent with initial HEP    Status Achieved      PT SHORT TERM GOAL #2   Title walk with rolling walker x 30 feet and demonstrate moderate shortness of breath    Baseline Pt reported mod SOB, did use inhaler    Status Achieved      PT SHORT TERM GOAL #3   Title initiate a walking program at home to improve ambulation and begin to wean from wheelchair use at home    Baseline every other day walking within home using walker    Status Achieved             PT Long Term Goals - 01/08/21 0947      PT LONG TERM GOAL #1   Title Pt will be independent with progression of final HEP    Status On-going      PT LONG TERM GOAL #2   Title improve strength to wean from wheelchair use > or = to 30% of the time for home distances    Baseline still using w/c 90% of the time    Status On-going      PT LONG TERM GOAL #3   Title improve LE flexibility to stand erect with standing x 1 minute    Baseline 30 sec with flexed trunk and knees, paticipated in weight shifting    Status On-going      PT LONG TERM GOAL #4   Title improve strength and endurance to walk 50-75 feet with rolling walker with moderate shortness of breath    Baseline 30 feet, limited by fatigue and SOB    Status On-going      PT LONG TERM GOAL #5   Title perform standing  exercise for 3-5 minutes in the clinic to improve endurance and independence with meal prep and self care tasks    Baseline 1-2 min    Status On-going                 Plan - 01/08/21 1003    Clinical Impression Statement Pt has had limited attendance secondary to outside factors with schedule.  She has met all STGs and is working toward [employer].  She continues to use W/C at home 90% of the time.  She is able to ambulate in clinic with RW 2x 30' limited by SOB and fatigue.  She continues to present with flexed trunk and flexed bil knees secondary to pain and stiffness.  She is limited in standing to 2 min with efforts to respond to PT cues for more erect posture and straighter knees with weight shifting today.  LE strength scores have gone up a half strength score bil in all muscle groups. She is compliant with seated ther ex daily or every other day and is performing household amb with RW every other day.  PT encouraged her to try to perform more standing with RW/weight shifts with posture focus and gave Pt goal of household ambulation daily vs every other day to improve gait endurance and tolerance.  Pt will continue to benefit from skilled PT in both land and aquatic environment to address pain, mobility deficits and endurance to work toward [place].  Comorbidities obesity, W/C x 1 year, fibromyalgia, depression    Examination-Activity Limitations Stand;Stairs;Squat;Locomotion Level;Transfers;Dressing;Hygiene/Grooming    Examination-Participation Restrictions Cleaning;Community Activity;Meal Prep    Stability/Clinical Decision Making Evolving/Moderate complexity    Clinical Decision Making Moderate    Rehab Potential Good    PT Frequency 2x / week    PT Duration 6 weeks    PT Treatment/Interventions ADLs/Self Care Home Management;Cryotherapy;Moist Heat;Neuromuscular re-education;Therapeutic exercise;Therapeutic activities;Functional mobility training;Stair training;Gait training;Patient/family  education;Wheelchair mobility training;Manual techniques;Energy conservation;Passive range of motion;Taping;Aquatic Therapy    PT Next Visit Plan continue standing tolerance with erect posture, weight shifting, standing ther ex as tol to reach 5', gait endurance, LE flexibility for hip flexors and hamstrings    PT Home Exercise Plan Access Code: [email or web address]    Consulted and Agree with Plan of Care Patient           Patient will benefit from skilled therapeutic intervention in order to improve the following deficits and impairments:  Abnormal gait,Decreased activity tolerance,Decreased balance,Decreased strength,Postural dysfunction,Impaired flexibility,Hypomobility,Pain,Decreased endurance,Difficulty walking,Decreased range of motion  Visit Diagnosis: Muscle weakness (generalized) - Plan: PT plan of care cert/re-cert  Difficulty in walking, not elsewhere classified - Plan: PT plan of care cert/re-cert  Stiffness of left hip, not elsewhere classified - Plan: PT plan of care cert/re-cert  Stiffness of right hip, not elsewhere classified - Plan: PT plan of care cert/re-cert     Problem List Patient Active Problem List   Diagnosis Date Noted  . Adjustment disorder with mixed disturbance of emotions and conduct 07/28/2020  . Polyp of descending colon 02/15/2020  . Morbid obesity with BMI of 60.0-69.9, adult ([place]) 07/17/2019  . Pedal edema 07/17/2019  . Multilevel degenerative disc disease 10/31/2018  . Abdominal pain 10/28/2018  . Change in bowel function 10/28/2018  . Severe episode of recurrent major depressive disorder, with psychotic features ([place]) 05/26/2018  . Gastritis and gastroduodenitis 04/26/2018  . Atrophic vaginitis 04/01/2018  . Diabetes mellitus ([place]) 03/31/2018  . Cough, persistent 02/08/2018  . Dysphagia 12/14/2017  . 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 Urinary incontinence 07/23/2017  . Early satiety 06/16/2017  . Hx of iron deficiency anemia 05/28/2017  . Throat pain in  adult 04/05/2017  . Prediabetes 02/04/2017  . Dyspnea on exertion 12/23/2016  . 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 Hemoptysis 12/23/2016  . Fibromyalgia 08/18/2016  . Chronic lymphocytic thyroiditis 08/18/2016  . Depression 04/01/2016  . OSA (obstructive sleep apnea) 04/01/2016  . Essential hypertension 12/09/2015  . Hypothyroidism 12/09/2015  . Morbid obesity due to excess calories ([place]) 12/09/2015  . Constipation 05/27/2015  . Fatty liver 05/27/2015  . Abdominal pain, chronic, epigastric 07/04/2012  . Anxiety 06/09/2012  . History of gastroesophageal reflux (GERD) 06/09/2012  . Hyperlipidemia 06/09/2012  . Refusal of blood transfusions as patient is Jehovah's Witness 06/09/2012  . 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 Abnormal small bowel biopsy 09/18/2011  . Lactose intolerance 09/18/2011  . GERD 01/21/2010    Baruch Merl, PT 01/08/21 12:50 PM   [facility] [address] [place], [place], [address] Phone: [phone]   Fax:  [phone]  Name: NERIDA BOIVIN MRN: [id number] Date of Birth: December 05, 1955

## 2021-01-08 NOTE — Patient Instructions (Signed)
Visit Information  Ms. Cala was given information about Medicaid Managed Care team care coordination services as a part of their [place] Complete Medicaid benefit. [name] verbally consented to engagement with the [facility] Managed Care team.   For questions related to your [facility] Complete Medicaid health plan, please call: [phone]  If you would like to schedule transportation through your [place] Medicaid plan, please call the following number at least 2 days in advance of your appointment: [phone].   Call the Behavioral Health Crisis Line at [phone], at any time, 24 hours a day, 7 days a week. If you are in danger or need immediate medical attention call 911.  Ms. Daniel - following are the goals we discussed in your visit today:  Goals Addressed            This Visit's Progress   . Manage My Medicine       Timeframe:  Short-Term Goal Priority:  High Start Date:                             Expected End Date:                       Follow Up Date Monthly   - call for medicine refill 2 or 3 days before it runs out - keep a list of all the medicines I take; vitamins and herbals too - learn to read medicine labels - use a pillbox to sort medicine    Why is this important?   . These steps will help you keep on track with your medicines.   Notes:        Please see education materials related to DM provided as print materials.   Patient verbalizes understanding of instructions provided today.   The Managed Medicaid care management team will reach out to the patient again over the next 30 days.   Artelia Laroche, Pharm.D., Managed Medicaid Pharmacist [phone]   Following is a copy of your plan of care:  Patient Care Plan: Social Work - Depression (Adult)    Problem Identified: Depression Identification (Depression)     Goal: Psychiatry and Outpatient Therapy for Depressive Symptoms Completed 10/15/2020  Start Date: 08/28/2020   Expected End Date: 11/04/2020  This Visit's Progress: On track  Recent Progress: On track  Priority: High  Note:    Current Barriers:  . 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 . Transportation - Due to husband's treatment, he is not always available to transport patient to her appointments.  Marland Kitchen Update 08/28/2020: Transportation issues still create barriers for patient. She is aware that she can contact her Medicaid plan, [employer], for transportation assistance. Marland Kitchen Update 09/25/2020: Patient was unable to keep previously scheduled appointment on 08/28/2020 due to conflict with husband's appointments. . Mental Health Concerns  . Patient's husband has cancer and is currently receiving treatment at the [place] in [place]. . Suicidal Ideation/Homicidal Ideation: No  Clinical Social Work Goal(s):  Marland Kitchen Over the next 60 days, patient will work with SW bi-weekly by telephone or in person to reduce or manage symptoms related to depression. . Over the next 30 days, patient will attend all scheduled mental health appointments: as scheduled. Patient will arrange transportation needs in the appropriate time frame to ensure she has transportation for scheduled appointments.  . Over the next 30 days, patient will demonstrate improved health management  independence as evidenced by symptom management.   Interventions: . Patient interviewed and appropriate assessments performed: PHQ 2 and PHQ 9 . Patient interviewed and appropriate assessments performed . Provided patient with information about depression and physical illness. . Advised patient to discuss outpatient therapy with psychiatrist. Patient is scheduled for psychiatry at [facility] on 08/15/2020. LCSW encouraged patient to discuss therapy needs with the psychiatrist during this appointment so that she can receive both psychiatry and outpatient therapy at the same agency.  Marland Kitchen Update 08/28/2020:  Patient stated she had to cancel the appointment due to transportation issues and reschedule for today, 08/28/2020 @ 2:30pm. She stated she would like to discuss therapy needs at that time.  Marland Kitchen Update 09/25/2020: Patient was unable to keep previously scheduled appointment on 08/28/2020 due to conflict with husband's appointments. Appointment was rescheduled to Monday, 09/30/2020. Marland Kitchen Emotional/Supportive Counseling . Update 09/25/2020: LCSW will follow up on October 15, 2020 @ 2:00pm. . Update 10/15/2020: Patient confirmed psychiatric appointment attendance at [facility]. She stated the psychiatrist is scheduling her with a therapist at the same agency.  Patient Self Care Activities:  . Self administers medications as prescribed . Attends church or other social activities . Ability for insight . Motivation for treatment . Strong family or social support  Patient Coping Strengths:  . Supportive Relationships . Family . Friends . Church . [employer] . Spirituality . Hopefulness  Patient Self Care Deficits:  . Unable to independently get around without assistance due to arthritis all over her body. . Unable to perform ADLs independently . Unable to perform IADLs independently . Husband has cancer. . Transportation issues due to husband receiving treatment for cancer.  Please see past updates related to this goal by clicking on the "Past Updates" button in the selected goal    Patient Care Plan: Weight loss to Improve Health    Problem Identified: Weight Management     Long-Range Goal: Achieving Weight Loss   Start Date: 08/22/2020  Expected End Date: 02/11/2021  This Visit's Progress: On track  Recent Progress: On track  Priority: High  Note:    Current Barriers:  Marland Kitchen Knowledge Deficits related to Weight loss. Ms. Iannello reports that she needs to work on weight loss. She is needing knee surgery, but needs to lose weight before having the procedure.  Recent weight was 346#. Ms.  Lusher would like to lose 50#, eventually getting down to 230#. Patient is checking BS twice daily, readings have been high x 3 weeks(180-240). She reports taking her medication and cutting back on carbohydrates. She is interested in taking a diabetic education class. She started physical therapy since last visit and is excited about her first session of aquatic therapy tomorrow. . Unable to independently develop a plan for weight loss . Ms Walkup is requesting a bedside commode, wheelchair and CPAP to better manage her health at home-She has received the wheelchair and bedside commode. Still waiting on CPAP-new order placed by Dr. Laural Benes   Nurse Case Manager Clinical Goal(s):  Marland Kitchen Over the next 30 days, patient will verbalize understanding of plan for weight management-Met-Ms Takeda reports eating most meals at home, prepared by her husband. They have cut out all potatoes, rice and pasta, eating whole wheat bread and increased vegetables. She has been unable to exercise due to knee pain and is interested in Aquatic Therapy. Ms Diosdado will begin Aquatic Therapy on 11/14/20 . Over the next 60 days, patient will continue to meet with RN Care Manager to address  ways to achieve weight loss . Over the next 30 days, patient will work with CM team pharmacist to review medications-Met  Interventions:  . Inter-disciplinary care team collaboration (see longitudinal plan of care) . Provided education to patient re: diet modification, diabetes and exercise -RNCM will resend in the mail . Discussed plans with patient for ongoing care management follow up and provided patient with direct contact information for care management team . RNCM explained to patient the importance of managing diabetes and how it can help with weight loss . Collaborate with PCP for referral to diabetic education . Reviewed medications discussed her diabetic medication regimen . Discussed exercises that she can do while sitting . Reviewed upcoming  appointments  . Encouragement provided to continue working toward goal  Patient Goals/Self-Care Activities Over the next 30 days, patient will: - check blood sugar at prescribed times - take the blood sugar log to all doctor visits - take the blood sugar meter to all doctor visits  - Self administers medications as prescribed - Attends all scheduled provider appointments - Calls provider office for new concerns or questions - change to whole grain breads, cereal, pasta - drink 6 to 8 glasses of water each day - eat 3 to 5 servings of fruits and vegetables each day - eat 5 or 6 small meals each day - limit fast food meals to no more than 1 per week - manage portion size - Eat three small meals a day instead of 2 larger meals - Cut out regular soda-switch to diet soda and increase water intake - keep a food diary - reduce red meat to 2 to 3 times a week  Follow Up Plan: The Managed Medicaid care management team will reach out to the patient again over the next 30 days on 01/14/21 @ 1pm     Patient Care Plan: Medication Management    Problem Identified: Medication Management   Note:   Current Barriers:  . Unable to independently afford treatment regimen . Unable to self administer medications as prescribed . Does not adhere to prescribed medication regimen . Does not maintain contact with provider office . Does not contact provider office for questions/concerns .   Pharmacist Clinical Goal(s):  Marland Kitchen Over the next 30 days, patient will contact provider office for questions/concerns as evidenced notation of same in electronic health record through collaboration with PharmD and provider.  .   Interventions: . Inter-disciplinary care team collaboration (see longitudinal plan of care) . Comprehensive medication review performed; medication list updated in electronic medical record  @RXCPDIABETES @ @RXCPHYPERTENSION @ @RXCPHYPERLIPIDEMIA @ @RXCPASTHMA @ @RXCPMENTALHEALTH @  Patient  Goals/Self-Care Activities . Over the next 30 days, patient will:  - take medications as prescribed collaborate with provider on medication access solutions  Follow Up Plan: The care management team will reach out to the patient again over the next 30 days.     Goal: Self-Management Plan Developed   Note:   Evidence-based guidance:   Review biopsychosocial determinants of health screens.   Determine level of modifiable health risk.   Assess level of patient activation, level of readiness, importance and confidence to make changes.   Evoke change talk using open-ended questions, pros and cons, as well as looking forward.   Identify areas where behavior change may lead to improved health.   Partner with patient to develop a robust self-management plan that includes lifestyle factors, such as weight loss, exercise and healthy nutrition, as well as goals specific to disease risks.   Support patient and  family/caregiver active participation in decision-making and self-management plan.   Implement additional goals and interventions based on identified risk factors to reduce health risk.   Facilitate advance care planning.   Review need for preventive screening based on age, sex, family history and health history.   Notes:    Task: Mutually Develop and Royce Macadamia Achievement of Patient Goals   Note:   Care Management Activities:    - verbalization of feelings encouraged    Notes:

## 2021-01-09 ENCOUNTER — Other Ambulatory Visit: Payer: Self-pay

## 2021-01-10 ENCOUNTER — Ambulatory Visit: Payer: Medicaid Other | Admitting: Physical Therapy

## 2021-01-10 ENCOUNTER — Other Ambulatory Visit: Payer: Self-pay

## 2021-01-13 ENCOUNTER — Other Ambulatory Visit: Payer: Self-pay | Admitting: Internal Medicine

## 2021-01-13 MED ORDER — HYDROCHLOROTHIAZIDE 25 MG PO TABS: 25.0000 mg | ORAL_TABLET | Freq: Every day | ORAL | 3 refills | Status: DC

## 2021-01-13 MED ORDER — TRULICITY 0.75 MG/0.5ML ~~LOC~~ SOAJ: 0.7500 mg | SUBCUTANEOUS | 3 refills | Status: DC

## 2021-01-13 MED ORDER — HYDRALAZINE HCL 50 MG PO TABS: 50.0000 mg | ORAL_TABLET | Freq: Two times a day (BID) | ORAL | 3 refills | Status: DC

## 2021-01-14 ENCOUNTER — Other Ambulatory Visit: Payer: Self-pay | Admitting: *Deleted

## 2021-01-14 ENCOUNTER — Other Ambulatory Visit: Payer: Self-pay

## 2021-01-14 NOTE — Patient Outreach (Signed)
Medicaid Managed Care   Nurse Care Manager Note  01/14/2021 Name:  Robin [name] MRN:  [id number] DOB:  1956-04-29  Robin [name] is an 65 y.o. year old female who is [name] primary patient of Robin Pier, MD.  The [facility] Managed Care Coordination team was consulted for assistance with:    Depression DMII  Robin [name] was given information about Medicaid Managed Care Coordination team services today. Robin [name] agreed to services and verbal consent obtained.  Engaged with patient by telephone for follow up visit in response to provider referral for case management and/or care coordination services.   Assessments/Interventions:  Review of past medical history, allergies, medications, health status, including review of consultants reports, laboratory and other test data, was performed as part of comprehensive evaluation and provision of chronic care management services.  SDOH (Social Determinants of Health) assessments and interventions performed:   Care Plan  Allergies  Allergen Reactions  . Celexa [Citalopram Hydrobromide] Other (See Comments)    "Made me feel out of it"  . Gabapentin Other (See Comments)    Contributed to lower extremity edema  . Norvasc [Amlodipine Besylate] Other (See Comments)    Edema in lower extremities  . Diflucan [Fluconazole] Rash    Medications Reviewed Today    Reviewed by Robin Montane, RN (Registered Nurse) on 01/14/21 at 1336  Med List Status: <None>  Medication Order Taking? Sig Documenting Provider Last Dose Status Informant  ACCU-CHEK AVIVA PLUS test strip [id number] Yes USE AS DIRECTED Robin Pier, MD Taking Active Multiple Informants  acetaminophen (TYLENOL) 500 MG tablet [id number] Yes Take 1,000 mg by mouth 2 (two) times daily.  [provider] Taking Active Multiple Informants  albuterol (VENTOLIN HFA) 108 (90 Base) MCG/ACT inhaler [id number] Yes Inhale 1-2 puffs into the lungs every 6 (six) hours as needed for wheezing  or shortness of breath. Robin [name] Taking Active Multiple Informants        Discontinued 11/29/20 1619   aspirin EC 81 MG tablet [id number] Yes Take 81 mg by mouth at bedtime. [provider] Taking Active Multiple Informants  atorvastatin (LIPITOR) 40 MG tablet [id number] Yes Take 40 mg by mouth daily. [provider] Taking Active Multiple Informants  azelastine (ASTELIN) 0.1 % nasal spray [id number] Yes 1-2 sprays per nostril 2 times daily as needed  Patient taking differently: Place 1 spray into both nostrils 2 (two) times daily.   Robin [name], Robin Muta, MD Taking Active   azithromycin [facility]) 250 MG tablet [id number] No Take 1 tablet (250 mg total) by mouth daily. Take first 2 tablets together, then 1 every day until finished.  Patient not taking: No sig reported   Robin Morgan, MD Not Taking Active        Patient taking differently:      Discontinued 11/29/20 1620   CALCIUM CARBONATE ANTACID PO [id number] Yes Take 2 tablets by mouth See admin instructions. Take 2 tablets by mouth every morning, may also take 2 tablets at night as needed for acid reflux [provider] Taking Active            Med Note Robin [name]   Tue Sep 10, 2020  2:30 PM) Tapering off  celecoxib (CELEBREX) 100 MG capsule [id number] Yes TAKE 1 CAPSULE (100 MG TOTAL) BY MOUTH 2 (TWO) TIMES DAILY. Robin Pier, MD Taking Active   CINNAMON PO [id number] No Take 1 capsule by mouth daily.  Patient not taking: No sig reported  [provider] Not Taking Active   conjugated estrogens (PREMARIN) vaginal cream [id number] No Place 1 Applicatorful vaginally daily as needed (vaginal dryness/itching).  Patient not taking: No sig reported   [provider] Not Taking Active   DEXILANT 60 MG capsule [id number] No Take 1 capsule by mouth daily.  Patient not taking: No sig reported   [provider] Not Taking Active   diclofenac sodium (VOLTAREN) 1 % GEL  [id number]  Apply 2-4 g topically 4 (four) times daily as needed (pain).  Patient not taking: Reported on 01/08/2021   [provider]  Active Multiple Informants           Med Note (Robin [name]   Tue Sep 24, 2020 11:12 AM)    Diclofenac Sodium 3 % GEL [id number] Yes SMARTSIG:1-2 Gram(s) Topical 3-4 Times Daily [provider] Taking Active   DULoxetine (CYMBALTA) 30 MG capsule [id number] Yes TAKE 1 CAPSULE (30 MG TOTAL) BY MOUTH DAILY. Robin Pier, MD Taking Active   esomeprazole (NEXIUM) 40 MG capsule [id number] Yes TAKE 1 CAPSULE (40 MG TOTAL) BY MOUTH 2 (TWO) TIMES DAILY BEFORE [name] MEAL. Robin Menghini, PA-C Taking Active   famotidine (PEPCID) 20 MG tablet [id number] No TAKE 1 TABLET (20 MG TOTAL) BY MOUTH 2 (TWO) TIMES DAILY AS NEEDED FOR HEARTBURN OR INDIGESTION.  Patient not taking: No sig reported   Robin Menghini, PA-C Not Taking Active   fluticasone (FLONASE) 50 MCG/ACT nasal spray [id number] Yes Place 1 spray into both nostrils daily.  Patient taking differently: Place 1 spray into both nostrils daily as needed for allergies or rhinitis.   Robin Halt [name], [name] Taking Active   fluticasone (FLOVENT HFA) 220 MCG/ACT inhaler [id number] Yes INHALE 2 INHALATIONS TWICE DAILY  Patient taking differently: Inhale 2 puffs into the lungs 2 (two) times daily as needed (shortness of breath/wheezing).   Robin [name], Robin Muta, MD Taking Active            Med Note Robin [name]   Robin [name] 23, 2021  4:36 PM) Pt states that she only took once - when she could not find her albuterol  furosemide (LASIX) 40 MG tablet [id number] Yes TAKE 1/2 TO 1 TABLET BY MOUTH DAILY FOR LOWER EXTREMITY SWELLING  Patient taking differently: Take 20 mg by mouth daily.   Robin Pier, MD Taking Active   gabapentin (NEURONTIN) 300 MG capsule [id number] Yes TAKE 2 CAPSULES (600 MG TOTAL) BY MOUTH 2 (TWO) TIMES DAILY. Robin Rockers, MD Taking Active            Med Note Robin [name]   Tue Sep 10, 2020  2:32 PM) Told to stop due to interaction with Lyrica  glucose blood test strip [id number] Yes USE AS INSTRUCTED [provider] Taking Active Multiple Informants  glucose blood test strip [id number] No USE AS INSTRUCTED  Patient not taking: No sig reported   [provider] Not Taking Active   glucose blood test strip [id number] No USE AS INSTRUCTED  Patient not taking: No sig reported   Robin Pier, MD Not Taking Active   hydrALAZINE (APRESOLINE) 50 MG tablet [id number] Yes Take 1 tablet (50 mg total) by mouth 2 (two) times daily. Robin Pier, MD Taking Active   hydrochlorothiazide (HYDRODIURIL) 25 MG tablet [id number] Yes Take 1 tablet (25 mg total) by mouth daily. Robin Pier, MD Taking Active   levothyroxine (SYNTHROID) 88 MCG tablet [id number] Yes TAKE  1 TABLET (88 MCG TOTAL) BY MOUTH DAILY. Robin Pier, MD Taking Active   linaclotide Rolan Lipa) 290 MCG CAPS capsule [id number] Yes TAKE 1 CAPSULE BY MOUTH DAILY BEFORE BREAKFAST. Annitta Needs, [name] Taking Active   losartan (COZAAR) 25 MG tablet [id number] Yes Take 25 mg by mouth daily. [provider] Taking Active Multiple Informants  metFORMIN (GLUCOPHAGE) 1000 MG tablet [id number] Yes Take 1,000 mg by mouth 2 (two) times daily. [provider] Taking Active   metFORMIN (GLUCOPHAGE) 500 MG tablet [id number] No 2 tabs PO Q [name].m and 1 tab in evenings  Patient not taking: No sig reported   Robin Pier, MD Not Taking Active   Metoprolol Tartrate 75 MG TABS [id number] Yes TAKE 1 TABLET BY MOUTH TWICE DAILY.  Patient taking differently: Take 75 mg by mouth 2 (two) times daily.   Robin Pier, MD Taking Active   montelukast (SINGULAIR) 10 MG tablet [id number] Yes TAKE 1 TABLET BY MOUTH AT BEDTIME. Robin Pier, MD Taking Active            Med Note Robin [name]   Tue Sep 10, 2020  2:19 PM)    Multiple Vitamin (MULTIVITAMIN WITH MINERALS) TABS tablet [id number] Yes  Take 1 tablet by mouth daily.  [provider] Taking Active Multiple Informants  NARCAN 4 MG/0.1ML LIQD nasal spray kit [id number] No SMARTSIG:1 Spray(s) Both Nares Once PRN  Patient not taking: No sig reported   [provider] Not Taking Active   Omega-3 Fatty Acids (FISH OIL) 1000 MG CAPS [id number] Yes Take 1,000 mg by mouth at bedtime.  [provider] Taking Active Multiple Informants  ondansetron (ZOFRAN) 4 MG tablet [id number] No Take 4 mg by mouth every 8 (eight) hours as needed for nausea or vomiting.   Patient not taking: No sig reported   [provider] Not Taking Active            Med Note Robin [name]   Tue Sep 10, 2020  2:22 PM) PRN  OVER THE COUNTER MEDICATION [id number] No Take 1 capsule by mouth daily. BEET ROOT  Patient not taking: No sig reported   [provider] Not Taking Active   pregabalin (LYRICA) 25 MG capsule [id number] No Take 25 mg by mouth 2 (two) times daily.   Patient not taking: No sig reported   [provider] Not Taking Active            Med Note (Natania Finigan [name]   Tue Jan 14, 2021  1:36 PM) Not covered by insurance  spironolactone (ALDACTONE) 25 MG tablet [id number] Yes Take 25 mg by mouth daily. [provider] Taking Active   tiZANidine (ZANAFLEX) 4 MG tablet [id number] No Take 4 mg by mouth 2 (two) times daily as needed for muscle spasms.  Patient not taking: No sig reported   [provider] Not Taking Active   TRULICITY 4.94 WH/6.7RF SOPN [id number] Yes Inject 0.75 mg into the skin once [name] week. Robin Pier, MD Taking Active   valACYclovir (VALTREX) 500 MG tablet [id number] Yes Take 500 mg by mouth 2 (two) times daily. [provider] Taking Active            Med Note Robin [name]   TJQ [name] 23, 2021  4:46 PM) 7 day course filled 03/17/2020 and 04/09/2020  vitamin B-12 (CYANOCOBALAMIN) 1000 MCG tablet [id number] Yes Take 1,000 mcg by mouth daily. [provider] Taking Active  Vitamin D, Ergocalciferol, (DRISDOL) 1.25 MG (50000 UNIT) CAPS capsule [id number] Yes Take 50,000 Units by mouth once [name] week. [provider] Taking Active Multiple Informants           Med Note Robin [name]   KZS [name] 23, 2021  4:35 PM) New med 07/24/2020 - not yet taken          Patient Active Problem List   Diagnosis Date Noted  . Adjustment disorder with mixed disturbance of emotions and conduct 07/28/2020  . Polyp of descending colon 02/15/2020  . Morbid obesity with BMI of 60.0-69.9, adult ([place]) 07/17/2019  . Pedal edema 07/17/2019  . Multilevel degenerative disc disease 10/31/2018  . Abdominal pain 10/28/2018  . Change in bowel function 10/28/2018  . Severe episode of recurrent major depressive disorder, with psychotic features ([place]) 05/26/2018  . Gastritis and gastroduodenitis 04/26/2018  . Atrophic vaginitis 04/01/2018  . Diabetes mellitus ([place]) 03/31/2018  . Cough, persistent 02/08/2018  . Dysphagia 12/14/2017  . 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 Urinary incontinence 07/23/2017  . Early satiety 06/16/2017  . Hx of iron deficiency anemia 05/28/2017  . Throat pain in adult 04/05/2017  . Prediabetes 02/04/2017  . Dyspnea on exertion 12/23/2016  . 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 Hemoptysis 12/23/2016  . Fibromyalgia 08/18/2016  . Chronic lymphocytic thyroiditis 08/18/2016  . Depression 04/01/2016  . OSA (obstructive sleep apnea) 04/01/2016  . Essential hypertension 12/09/2015  . Hypothyroidism 12/09/2015  . Morbid obesity due to excess calories ([place]) 12/09/2015  . Constipation 05/27/2015  . Fatty liver 05/27/2015  . Abdominal pain, chronic, epigastric 07/04/2012  . Anxiety 06/09/2012  . History of gastroesophageal reflux (GERD) 06/09/2012  . Hyperlipidemia 06/09/2012  . Refusal of blood transfusions as patient is Jehovah's Witness 06/09/2012  . 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 Abnormal small bowel biopsy 09/18/2011  . Lactose intolerance 09/18/2011  . GERD 01/21/2010    Conditions to be addressed/monitored per PCP order:  DMII, Depression and BMI  Care Plan : Weight loss to Improve Health  Updates made by Robin Montane, RN since 01/14/2021 12:00 AM    Problem: Weight Management     Long-Range Goal: Achieving Weight Loss   Start Date: 08/22/2020  Expected End Date: 02/18/2021  This Visit's Progress: On track  Recent Progress: On track  Priority: High  Note:    Current Barriers:  Marland Kitchen Knowledge Deficits related to Weight loss. Ms. Andre reports that she needs to work on weight loss. She is needing knee surgery, but needs to lose weight before having the procedure.  Recent weight was 346#. Ms. Andress would like to lose 50#, eventually getting down to 230#. Patient is checking BS twice daily, readings have been high x 3 weeks(180-240). She reports taking her medication and cutting back on carbohydrates. She is interested in taking [name] diabetic education class. She started physical therapy since last visit and is excited about her first session of aquatic therapy tomorrow. Update-Ms. Woodfin continues to attend PT and aquatic therapy and feels like she is moving more. She has diabetic education scheduled in May and continues to watch what she is eating. She has lost 9# and is down to 335#. She reports blood sugar readings now fasting 117-129 and postprandial 125-130. Marland Kitchen Unable to independently develop [name] plan for weight loss . Ms Lucia is requesting [name] bedside commode, wheelchair and CPAP to better manage her health at home-She has received the wheelchair and bedside  commode. Still waiting on CPAP-new order placed by Dr. Leighton Roach CPAP last Thursday 01/09/21 and is using it every night   Nurse Case Manager Clinical Goal(s):  . patient will verbalize understanding of plan for weight management-Met-Ms Koelzer reports eating most meals at home,  prepared by her husband. They have cut out all potatoes, rice and pasta, eating whole wheat bread and increased vegetables. She has been unable to exercise due to knee pain and is interested in Aquatic Therapy. Ms Yogi will begin Aquatic Therapy on 11/14/20 . patient will continue to meet with RN Care Manager to address ways to achieve weight loss . patient will work with CM team pharmacist to review medications-Met  Interventions:  . Inter-disciplinary care team collaboration (see longitudinal plan of care) . Provided education to patient re: mood . Discussed plans with patient for ongoing care management follow up and provided patient with direct contact information for care management team . RNCM explained to patient the importance of managing diabetes and how it can help with weight loss . Reviewed medications discussed her now taking gabapentin due to insurance not covering Lyrica . Collaborate with MM Pharmacist regarding medications . Discussed exercises that she can do while sitting . Reviewed upcoming appointments  . Encouragement provided to continue working toward goal  Patient Goals/Self-Care Activities Over the next 30 days, patient will: - check blood sugar at prescribed times - take the blood sugar log to all doctor visits - take the blood sugar meter to all doctor visits  - Self administers medications as prescribed - Attends all scheduled provider appointments - Calls provider office for new concerns or questions - change to whole grain breads, cereal, pasta - drink 6 to 8 glasses of water each day - eat 3 to 5 servings of fruits and vegetables each day - eat 5 or 6 small meals each day - limit fast food meals to no more than 1 per week - manage portion size - Eat three small meals [name] day instead of 2 larger meals - Cut out regular soda-switch to diet soda and increase water intake - keep [name] food diary - reduce red meat to 2 to 3 times [name] week  Follow Up Plan: The  Managed Medicaid care management team will reach out to the patient again over the next 30 days on 02/18/21 @ 1pm       Follow Up:  Patient agrees to Care Plan and Follow-up.  Plan: The Managed Medicaid care management team will reach out to the patient again over the next 30 days. and The patient has been provided with contact information for the Managed Medicaid care management team and has been advised to call with any health related questions or concerns.  Date/time of next scheduled RN care management/care coordination outreach: 02/18/21 @ 1pm  [name] RN, [place] RN Care Coordinator

## 2021-01-14 NOTE — Patient Instructions (Signed)
Visit Information  Ms. Bier was given information about Medicaid Managed Care team care coordination services as a part of their Healthy [facility] Medicaid benefit. [name] verbally consented to engagement with the [facility] Managed Care team.   For questions related to your Healthy [facility] health plan, please call: [phone] or visit the homepage here: [email or web address]  If you would like to schedule transportation through your Healthy [facility] plan, please call the following number at least 2 days in advance of your appointment: [phone]   Call the [place] at [phone], at any time, 24 hours a day, 7 days a week. If you are in danger or need immediate medical attention call 911.  Ms. Yorke - following are the goals we discussed in your visit today:  Goals Addressed            This Visit's Progress   . Monitor and Manage My Blood Sugar-Diabetes Type 2       Timeframe:  Long-Range Goal Priority:  High Start Date:  08/22/21                           Expected End Date: 02/18/21                  Follow Up Date 02/18/21   - check blood sugar at prescribed times - take the blood sugar log to all doctor visits - take the blood sugar meter to all doctor visits  - Self administers medications as prescribed - Attends all scheduled provider appointments - Calls provider office for new concerns or questions - change to whole grain breads, cereal, pasta - drink 6 to 8 glasses of water each day - eat 3 to 5 servings of fruits and vegetables each day - eat 5 or 6 small meals each day - limit fast food meals to no more than 1 per week - manage portion size - Eat three small meals a day instead of 2 larger meals - Cut out regular soda-switch to diet soda and increase water intake - keep a food diary - reduce red meat to 2 to 3 times a week   Why is this important?    Checking your blood sugar at home helps to  keep it from getting very high or very low.   Writing the results in a diary or log helps the doctor know how to care for you.   Your blood sugar log should have the time, date and the results.   Also, write down the amount of insulin or other medicine that you take.   Other information, like what you ate, exercise done and how you were feeling, will also be helpful.            Please see education materials related to mood provided as print materials.    10 LITTLE Things To Do When You're Feeling Too Down To Do Anything  Take a shower. Even if you plan to stay in all day long and not see a soul, take a shower. It takes the most effort to hop in to the shower but once you do, you'll feel immediate results. It will wake you up and you'll be feeling much fresher (and cleaner too).  Brush and floss your teeth. Give your teeth a good brushing with a floss finish. It's a small task but it feels so good and you can check 'taking care of your health' off the  list of things to do.  Do something small on your list. Most of us have some small thing we would like to get done (load of laundry, sew a button, email a friend). Doing one of these things will make you feel like you've accomplished something.  Drink water. Drinking water is easy right? It's also really beneficial for your health so keep a glass beside you all day and take sips often. It gives you energy and prevents you from boredom eating.  Do some floor exercises. The last thing you want to do is exercise but it might be just the thing you need the most. Keep it simple and do exercises that involve sitting or laying on the floor. Even the smallest of exercises release chemicals in the brain that make you feel good. Yoga stretches or core exercises are going to make you feel good with minimal effort.  Make your bed. Making your bed takes a few minutes but it's productive and you'll feel relieved when it's done. An unmade bed is a  huge visual reminder that you're having an unproductive day. Do it and consider it your housework for the day.  Put on some nice clothes. Take the sweatpants off even if you don't plan to go anywhere. Put on clothes that make you feel good. Take a look in the mirror so your brain recognizes the sweatpants have been replaced with clothes that make you look great. It's an instant confidence booster.  Wash the dishes. A pile of dirty dishes in the sink is a reflection of your mood. It's possible that if you wash up the dishes, your mood will follow suit. It's worth a try.  Cook a real meal. If you have the luxury to have a "do nothing" day, you have time to make a real meal for yourself. Make a meal that you love to eat. The process is good to get you out of the funk and the food will ensure you have more energy for tomorrow.  Write out your thoughts by hand. When you hand write, you stimulate your brain to focus on the moment that you're in so make yourself comfortable and write whatever comes into your mind. Put those thoughts out on paper so they stop spinning around in your head. Those thoughts might be the very thing holding you down.   The patient verbalized understanding of instructions provided today and agreed to receive a mailed copy of patient instruction and/or educational materials.  Telephone follow up appointment with Managed Medicaid care management team member scheduled for:02/18/21 @ 1pm    RN, BSN [place]  [facility] RN Care Coordinator   Following is a copy of your plan of care:  Patient Care Plan: Social Work - Depression (Adult)    Problem Identified: Depression Identification (Depression)     Goal: Psychiatry and Outpatient Therapy for Depressive Symptoms Completed 10/15/2020  Start Date: 08/28/2020  Expected End Date: 11/04/2020  This Visit's Progress: On track  Recent Progress: On track  Priority: High  Note:    Current Barriers:   . 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 . Transportation - Due to husband's treatment, he is not always available to transport patient to her appointments.  . Update 08/28/2020: Transportation issues still create barriers for patient. She is aware that she can contact her Medicaid plan, [employer], for transportation assistance. . Update 09/25/2020: Patient was unable to keep previously scheduled appointment on 08/28/2020 due   to conflict with husband's appointments. . Mental Health Concerns  . Patient's husband has cancer and is [facility] [place]. . Suicidal Ideation/Homicidal Ideation: No  Clinical Social Work Goal(s):  . Over the next 60 days, patient will work with SW bi-weekly by telephone or in person to reduce or manage symptoms related to depression. . Over the next 30 days, patient will attend all scheduled mental health appointments: as scheduled. Patient will arrange transportation needs in the appropriate time frame to ensure she has transportation for scheduled appointments.  . Over the next 30 days, patient will demonstrate improved health management independence as evidenced by symptom management.   Interventions: . Patient interviewed and appropriate assessments performed: PHQ 2 and PHQ 9 . Patient interviewed and appropriate assessments performed . Provided patient with information about depression and physical illness. . Advised patient to discuss outpatient therapy with psychiatrist. Patient is scheduled for psychiatry at [facility] on 08/15/2020. LCSW encouraged patient to discuss therapy needs with the psychiatrist during this appointment so that she can receive both psychiatry and outpatient therapy at the same agency.  . Update 08/28/2020: Patient stated she had to cancel the appointment due to transportation issues and reschedule for today, 08/28/2020 @ 2:30pm. She stated she  would like to discuss therapy needs at that time.  . Update 09/25/2020: Patient was unable to keep previously scheduled appointment on 08/28/2020 due to conflict with husband's appointments. Appointment was rescheduled to Monday, 09/30/2020. . Emotional/Supportive Counseling . Update 09/25/2020: LCSW will follow up on October 15, 2020 @ 2:00pm. . Update 10/15/2020: Patient confirmed psychiatric appointment attendance at [facility]. She stated the psychiatrist is scheduling her with a therapist at the same agency.  Patient Self Care Activities:  . Self administers medications as prescribed . Attends church or other social activities . Ability for insight . Motivation for treatment . Strong family or social support  Patient Coping Strengths:  . Supportive Relationships . Family . Friends . Church . Community Organizations . Spirituality . Hopefulness  Patient Self Care Deficits:  . Unable to independently get around without assistance due to arthritis all over her body. . Unable to perform ADLs independently . Unable to perform IADLs independently . Husband has cancer. . Transportation issues due to husband receiving treatment for cancer.  Please see past updates related to this goal by clicking on the "Past Updates" button in the selected goal    Patient Care Plan: Weight loss to Improve Health    Problem Identified: Weight Management     Long-Range Goal: Achieving Weight Loss   Start Date: 08/22/2020  Expected End Date: 02/18/2021  This Visit's Progress: On track  Recent Progress: On track  Priority: High  Note:    Current Barriers:  . Knowledge Deficits related to Weight loss. Ms. Fredricksen reports that she needs to work on weight loss. She is needing knee surgery, but needs to lose weight before having the procedure.  Recent weight was 346#. Ms. Blundell would like to lose 50#, eventually getting down to 230#. Patient is checking BS twice daily, readings have been high x 3  weeks(180-240). She reports taking her medication and cutting back on carbohydrates. She is interested in taking a diabetic education class. She started physical therapy since last visit and is excited about her first session of aquatic therapy tomorrow. Update-Ms. Lizana continues to attend PT and aquatic therapy and feels like she is moving more. She has diabetic education scheduled in May and continues to watch   what she is eating. She has lost 9# and is down to 335#. She reports blood sugar readings now fasting 117-129 and postprandial 125-130. . Unable to independently develop a plan for weight loss . Ms Ohlrich is requesting a bedside commode, wheelchair and CPAP to better manage her health at home-She has received the wheelchair and bedside commode. Still waiting on CPAP-new order placed by Dr. Johnson-Received CPAP last Thursday 01/09/21 and is using it every night   Nurse Case Manager Clinical Goal(s):  . patient will verbalize understanding of plan for weight management-Met-Ms Puccio reports eating most meals at home, prepared by her husband. They have cut out all potatoes, rice and pasta, eating whole wheat bread and increased vegetables. She has been unable to exercise due to knee pain and is interested in Aquatic Therapy. Ms Dibello will begin Aquatic Therapy on 11/14/20 . patient will continue to meet with RN Care Manager to address ways to achieve weight loss . patient will work with CM team pharmacist to review medications-Met  Interventions:  . Inter-disciplinary care team collaboration (see longitudinal plan of care) . Provided education to patient re: mood . Discussed plans with patient for ongoing care management follow up and provided patient with direct contact information for care management team . RNCM explained to patient the importance of managing diabetes and how it can help with weight loss . Reviewed medications discussed her now taking gabapentin due to insurance not covering  Lyrica . Collaborate with MM Pharmacist regarding medications . Discussed exercises that she can do while sitting . Reviewed upcoming appointments  . Encouragement provided to continue working toward goal  Patient Goals/Self-Care Activities Over the next 30 days, patient will: - check blood sugar at prescribed times - take the blood sugar log to all doctor visits - take the blood sugar meter to all doctor visits  - Self administers medications as prescribed - Attends all scheduled provider appointments - Calls provider office for new concerns or questions - change to whole grain breads, cereal, pasta - drink 6 to 8 glasses of water each day - eat 3 to 5 servings of fruits and vegetables each day - eat 5 or 6 small meals each day - limit fast food meals to no more than 1 per week - manage portion size - Eat three small meals a day instead of 2 larger meals - Cut out regular soda-switch to diet soda and increase water intake - keep a food diary - reduce red meat to 2 to 3 times a week  Follow Up Plan: The Managed Medicaid care management team will reach out to the patient again over the next 30 days on 02/18/21 @ 1pm     Patient Care Plan: Medication Management    Problem Identified: Medication Management   Note:   Current Barriers:  . Unable to independently afford treatment regimen . Unable to self administer medications as prescribed . Does not adhere to prescribed medication regimen . Does not maintain contact with provider office . Does not contact provider office for questions/concerns .   Pharmacist Clinical Goal(s):  . Over the next 30 days, patient will contact provider office for questions/concerns as evidenced notation of same in electronic health record through collaboration with PharmD and provider.  .   Interventions: . Inter-disciplinary care team collaboration (see longitudinal plan of care) . Comprehensive medication review performed; medication list  updated in electronic medical record  @[email or web address]@ [email or web address] @[email or web address] @[email or web address]@ @[email or web address]@  Patient Goals/Self-Care Activities . Over the next   30 days, patient will:  - take medications as prescribed collaborate with provider on medication access solutions  Follow Up Plan: The care management team will reach out to the patient again over the next 30 days.     Goal: Self-Management Plan Developed   Note:   Evidence-based guidance:   Review biopsychosocial determinants of health screens.   Determine level of modifiable health risk.   Assess level of patient activation, level of readiness, importance and confidence to make changes.   Evoke change talk using open-ended questions, pros and cons, as well as looking forward.   Identify areas where behavior change may lead to improved health.   Partner with patient to develop a robust self-management plan that includes lifestyle factors, such as weight loss, exercise and healthy nutrition, as well as goals specific to disease risks.   Support patient and family/caregiver active participation in decision-making and self-management plan.   Implement additional goals and interventions based on identified risk factors to reduce health risk.   Facilitate advance care planning.   Review need for preventive screening based on age, sex, family history and health history.   Notes:

## 2021-01-15 ENCOUNTER — Other Ambulatory Visit: Payer: Self-pay

## 2021-01-15 ENCOUNTER — Encounter: Payer: Self-pay | Admitting: Physical Therapy

## 2021-01-15 ENCOUNTER — Ambulatory Visit: Payer: Medicaid Other | Admitting: Physical Therapy

## 2021-01-15 DIAGNOSIS — R278 Other lack of coordination: Secondary | ICD-10-CM

## 2021-01-15 DIAGNOSIS — M25652 Stiffness of left hip, not elsewhere classified: Secondary | ICD-10-CM

## 2021-01-15 DIAGNOSIS — R262 Difficulty in walking, not elsewhere classified: Secondary | ICD-10-CM

## 2021-01-15 DIAGNOSIS — M6281 Muscle weakness (generalized): Secondary | ICD-10-CM | POA: Diagnosis not present

## 2021-01-15 DIAGNOSIS — R2681 Unsteadiness on feet: Secondary | ICD-10-CM

## 2021-01-15 DIAGNOSIS — M25651 Stiffness of right hip, not elsewhere classified: Secondary | ICD-10-CM

## 2021-01-15 NOTE — Therapy (Signed)
[facility] [address] [place], [place], [address] Phone: [phone]   Fax:  [phone]  Physical Therapy Treatment  Patient Details  Name: Robin [name] MRN: [id number] Date of Birth: [birth year] Referring Provider (PT): Karle Plumber, MD   Encounter Date: 01/15/2021   PT End of Session - 01/15/21 0933    Visit Number 6    Date for PT Re-Evaluation 02/19/21    Authorization Type Medicaid: submitted authorization for visits    Authorization Time Period 12 visits, auth time period 12/11/20-01/21/21    Authorization - Number of Visits 12    PT Start Time 0934    PT Stop Time 1007    PT Time Calculation (min) 33 min    Activity Tolerance Patient limited by fatigue    Behavior During Therapy [facility] for tasks assessed/performed           Past Medical History:  Diagnosis Date  . Anxiety disorder   . Arthritis   . Asthma   . Chronic neck pain   . Constipation   . Depression   . Diabetes mellitus without complication ([place])   . Dyspnea   . GERD (gastroesophageal reflux disease)   . Hiatal hernia    small  . Hyperlipidemia   . Hypertension   . Hypothyroidism   . Morbid obesity with BMI of 50.0-59.9, adult ([place])   . Schatzki's ring    non critical  . Sinusitis   . Sleep apnea    CPAP, Sleep study at [place]  . Sleep apnea     Past Surgical History:  Procedure Laterality Date  . ABDOMINAL HYSTERECTOMY    . ABDOMINAL HYSTERECTOMY  02/18/2000  . CARDIAC CATHETERIZATION  08/18/2003   normal L main/LAD/L Cfx/RCA (Dr. Adora Fridge)  . COLONOSCOPY  2006   Dr. [name], hyperplastic polyps  . COLONOSCOPY  08/06/11   abnormal terminal ileum for 10cm, erosions, geographical ulceration. Bx small bowel mucosa with prominent intramucosal lymphoid aggregates, slightly inflammed  . COLONOSCOPY WITH PROPOFOL N/A 12/28/2019   Rourk: 4 sessile serrated adenomas removed on the colon.  Next colonoscopy in  3 years.  Marland Kitchen DIRECT LARYNGOSCOPY  03/15/2012   Procedure: DIRECT LARYNGOSCOPY;  Surgeon: [name], MD;  Location: [place];  Service: ENT;  Laterality: N/A; Dr. Wolicki--:>no foreign body seen. normal esophagus to 40cm  . ESOPHAGEAL DILATION  04/17/2015   Procedure: ESOPHAGEAL DILATION;  Surgeon: [name], MD;  [facility];  Service: Endoscopy;;  . ESOPHAGOGASTRODUODENOSCOPY  08/23/2008   LDJ:TTSV distal esophageal erosions consistent with mild erosive reflux esophagitis, otherwise unremarkable esophagus/ Tiny antral erosions of doubtful clinical significance, otherwise normal stomach, patent pylorus, normal D1 and D2  . ESOPHAGOGASTRODUODENOSCOPY  08/06/11   small hh, noncritical Schatzki's ring s/p 30 F  . ESOPHAGOGASTRODUODENOSCOPY N/A 04/17/2015   XBL:TJQZES s/p dilation  . ESOPHAGOGASTRODUODENOSCOPY (EGD) WITH PROPOFOL N/A 01/20/2018   Dr. [name]: Erosive gastropathy, normal-appearing esophagus status post empiric dilation.  Chronic gastritis, no H. pylori.  . ESOPHAGOSCOPY  03/15/2012   Procedure: ESOPHAGOSCOPY;  Surgeon: [name], MD;  Location: [place];  Service: ENT;  Laterality: N/A;  . KNEE ARTHROSCOPY  04/27/2001  . MALONEY DILATION N/A 01/20/2018   Procedure: Venia Minks DILATION;  Surgeon: [name], MD;  [facility];  Service: Endoscopy;  Laterality: N/A;  . NM MYOCAR PERF WALL MOTION  2003   negative bruce protocol exercise tress test; EF 68%; intermediate risk  study due to evidence of anterior wall ischemia extending from mid-ventricle to apex  . PITUITARY SURGERY  06/2012   benign tumor, surgeon at [facility]  . POLYPECTOMY  12/28/2019   Procedure: POLYPECTOMY;  Surgeon: [name], MD;  [facility];  Service: Endoscopy;;  . RECTOCELE REPAIR    . TRANSTHORACIC ECHOCARDIOGRAM  2003   EF normal  . VAGINAL PROLAPSE REPAIR    . VIDEO BRONCHOSCOPY Bilateral 03/08/2017   Procedure: VIDEO BRONCHOSCOPY WITHOUT FLUORO;  Surgeon: Javier Glazier, MD;  Location: [place];  Service: Cardiopulmonary;  Laterality: Bilateral;    There were no vitals filed for this visit.   Subjective Assessment - 01/15/21 0934    Subjective Typical joint pains today but more Rt knee today.    Pertinent History use of motorized W/C due to weakness and LE pain.    Currently in Pain? Yes    Pain Score 7     Pain Location Knee    Pain Orientation Right    Pain Descriptors / Indicators Sore    Aggravating Factors  standing, walking    Pain Relieving Factors meds, HEP                             [facility] Adult PT Treatment/Exercise - 01/15/21 0001      Ambulation/Gait   Ambulation/Gait Yes    Ambulation/Gait Assistance 5: Supervision    Ambulation Distance (Feet) 50 Feet    Assistive device Rolling walker    Gait Comments 50 feet 2x      Posture/Postural Control   Posture/Postural Control Postural limitations    Postural Limitations Forward head;Rounded Shoulders;Flexed trunk    Posture Comments bil knee flexion in standing      Knee/Hip Exercises: Aerobic   Nustep L1 5 min      Knee/Hip Exercises: Seated   Long Arc Quad Both;2 sets;10 reps   added 1# on second set   Marching 20 reps;1 set    Marching Limitations 1#    Sit to [employer] 5 reps;with UE support                    PT Short Term Goals - 01/08/21 0946      PT SHORT TERM GOAL #1   Title independent with initial HEP    Status Achieved      PT SHORT TERM GOAL #2   Title walk with rolling walker x 30 feet and demonstrate moderate shortness of breath    Baseline Pt reported mod SOB, did use inhaler    Status Achieved      PT SHORT TERM GOAL #3   Title initiate a walking program at home to improve ambulation and begin to wean from wheelchair use at home    Baseline every other day walking within home using walker    Status Achieved             PT Long Term Goals - 01/08/21 0947      PT LONG TERM GOAL #1   Title Pt will be  independent with progression of final HEP    Status On-going      PT LONG TERM GOAL #2   Title improve strength to wean from wheelchair use > or = to 30% of the time for home distances    Baseline still using w/c 90% of the time    Status On-going  PT LONG TERM GOAL #3   Title improve LE flexibility to stand erect with standing x 1 minute    Baseline 30 sec with flexed trunk and knees, paticipated in weight shifting    Status On-going      PT LONG TERM GOAL #4   Title improve strength and endurance to walk 50-75 feet with rolling walker with moderate shortness of breath    Baseline 30 feet, limited by fatigue and SOB    Status On-going      PT LONG TERM GOAL #5   Title perform standing exercise for 3-5 minutes in the clinic to improve endurance and independence with meal prep and self care tasks    Baseline 1-2 min    Status On-going                 Plan - 01/15/21 0934    Clinical Impression Statement Pt was able to ambulate 50 feet with her RW 2x which was been her longest distance to date. Pt does require sitting in between her walks due to fatigue and shortness of breath. Pt was also able to tolerate 5 min on the Nustep today and the end of her session ( already fatigued.)    Personal Factors and Comorbidities Comorbidity 3+    Comorbidities obesity, W/C x 1 year, fibromyalgia, depression    Examination-Activity Limitations Stand;Stairs;Squat;Locomotion Level;Transfers;Dressing;Hygiene/Grooming    Examination-Participation Restrictions Cleaning;Community Activity;Meal Prep    Stability/Clinical Decision Making Evolving/Moderate complexity    Rehab Potential Good    PT Frequency 2x / week    PT Duration 6 weeks    PT Treatment/Interventions ADLs/Self Care Home Management;Cryotherapy;Moist Heat;Neuromuscular re-education;Therapeutic exercise;Therapeutic activities;Functional mobility training;Stair training;Gait training;Patient/family education;Wheelchair mobility  training;Manual techniques;Energy conservation;Passive range of motion;Taping;Aquatic Therapy    PT Next Visit Plan continue standing tolerance with erect posture, weight shifting, standing ther ex as tol to reach 5', gait endurance, LE flexibility for hip flexors and hamstrings    PT Home Exercise Plan Access Code: [email or web address]    Consulted and Agree with Plan of Care Patient           Patient will benefit from skilled therapeutic intervention in order to improve the following deficits and impairments:  Abnormal gait,Decreased activity tolerance,Decreased balance,Decreased strength,Postural dysfunction,Impaired flexibility,Hypomobility,Pain,Decreased endurance,Difficulty walking,Decreased range of motion  Visit Diagnosis: Muscle weakness (generalized)  Difficulty in walking, not elsewhere classified  Stiffness of left hip, not elsewhere classified  Stiffness of right hip, not elsewhere classified  Other lack of coordination  Unsteadiness on feet     Problem List Patient Active Problem List   Diagnosis Date Noted  . Adjustment disorder with mixed disturbance of emotions and conduct 07/28/2020  . Polyp of descending colon 02/15/2020  . Morbid obesity with BMI of 60.0-69.9, adult ([place]) 07/17/2019  . Pedal edema 07/17/2019  . Multilevel degenerative disc disease 10/31/2018  . Abdominal pain 10/28/2018  . Change in bowel function 10/28/2018  . Severe episode of recurrent major depressive disorder, with psychotic features ([place]) 05/26/2018  . Gastritis and gastroduodenitis 04/26/2018  . Atrophic vaginitis 04/01/2018  . Diabetes mellitus ([place]) 03/31/2018  . Cough, persistent 02/08/2018  . Dysphagia 12/14/2017  . 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 Urinary incontinence 07/23/2017  . Early satiety 06/16/2017  . Hx of iron deficiency anemia 05/28/2017  . Throat pain in adult 04/05/2017  . Prediabetes 02/04/2017  . Dyspnea on exertion 12/23/2016  . 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 Hemoptysis 12/23/2016  .  Fibromyalgia 08/18/2016  . Chronic lymphocytic thyroiditis 08/18/2016  . Depression 04/01/2016  . OSA (obstructive sleep apnea) 04/01/2016  . Essential hypertension 12/09/2015  . Hypothyroidism 12/09/2015  . Morbid obesity due to excess calories ([place]) 12/09/2015  . Constipation 05/27/2015  . Fatty liver 05/27/2015  . Abdominal pain, chronic, epigastric 07/04/2012  . Anxiety 06/09/2012  . History of gastroesophageal reflux (GERD) 06/09/2012  . Hyperlipidemia 06/09/2012  . Refusal of blood transfusions as patient is Jehovah's Witness 06/09/2012  . Pituitary macroadenoma ([place]) 04/04/2012  . Abnormal small bowel biopsy 09/18/2011  . Lactose intolerance 09/18/2011  . GERD 01/21/2010    Vianney Kopecky, PTA 01/15/2021, 10:07 AM  [facility] [address] [place], [place], [address] Phone: [phone]   Fax:  [phone]  Name: ASPASIA RUDE MRN: [id number] Date of Birth: 31-Mar-1956

## 2021-01-15 NOTE — Patient Outreach (Signed)
Patient having pain with Gabapentin 300mg  BID. Would like to try something stronger.  Called Hester Mates at [facility] to let her know

## 2021-01-17 ENCOUNTER — Ambulatory Visit: Payer: Medicaid Other | Admitting: Physical Therapy

## 2021-01-20 ENCOUNTER — Ambulatory Visit: Payer: Medicaid Other | Admitting: Physical Therapy

## 2021-01-22 ENCOUNTER — Telehealth: Payer: Self-pay | Admitting: Internal Medicine

## 2021-01-22 NOTE — Telephone Encounter (Signed)
Please return call and schedule pt a virtual appt with any available provider for home health services

## 2021-01-22 NOTE — Telephone Encounter (Signed)
Copied from [place] [id number]. Topic: General - Inquiry >> Jan 22, 2021  4:20 PM Oneta Rack wrote: Caller Name  Leah  Relation to pt: daughter  Call back number: [phone]    Reason for call:  Patient daughter contacted Medicare to inquire about Home Health assistance to assist with bathing, ADL needs. Insurance advised caller to contact PCP and request orders

## 2021-01-24 ENCOUNTER — Ambulatory Visit: Payer: Medicaid Other | Admitting: Physical Therapy

## 2021-01-25 NOTE — Progress Notes (Signed)
Patient did not need appointment.  

## 2021-01-27 ENCOUNTER — Other Ambulatory Visit: Payer: Self-pay

## 2021-01-27 ENCOUNTER — Encounter: Payer: Medicaid Other | Admitting: Family

## 2021-01-27 ENCOUNTER — Ambulatory Visit
Admission: RE | Admit: 2021-01-27 | Discharge: 2021-01-27 | Disposition: A | Discharge status: Home / Self Care | Payer: Medicaid Other | Source: Ambulatory Visit | Attending: Internal Medicine | Admitting: Internal Medicine

## 2021-01-27 DIAGNOSIS — Z1231 Encounter for screening mammogram for malignant neoplasm of breast: Secondary | ICD-10-CM

## 2021-01-27 NOTE — Patient Outreach (Signed)
Patient prescribed Gabepentin 300mg  BID from [facility]. She started taking 600mg  BID and would like the provider to prescribe it. Sent msg to PCP asking how to proceed next.

## 2021-01-30 ENCOUNTER — Ambulatory Visit (INDEPENDENT_AMBULATORY_CARE_PROVIDER_SITE_OTHER): Payer: Medicaid Other | Admitting: Orthopaedic Surgery

## 2021-01-30 ENCOUNTER — Other Ambulatory Visit: Payer: Self-pay

## 2021-01-30 ENCOUNTER — Ambulatory Visit (INDEPENDENT_AMBULATORY_CARE_PROVIDER_SITE_OTHER): Payer: Medicaid Other

## 2021-01-30 ENCOUNTER — Encounter: Payer: Self-pay | Admitting: Orthopaedic Surgery

## 2021-01-30 ENCOUNTER — Telehealth: Payer: Self-pay

## 2021-01-30 VITALS — Ht 68.0 in | Wt 343.0 lb

## 2021-01-30 DIAGNOSIS — Z6841 Body Mass Index (BMI) 40.0 and over, adult: Secondary | ICD-10-CM | POA: Diagnosis not present

## 2021-01-30 DIAGNOSIS — M170 Bilateral primary osteoarthritis of knee: Secondary | ICD-10-CM

## 2021-01-30 DIAGNOSIS — R634 Abnormal weight loss: Secondary | ICD-10-CM

## 2021-01-30 MED ORDER — BUPIVACAINE HCL 0.25 % IJ SOLN
2.0000 mL | INTRAMUSCULAR | Status: AC | PRN
  Administered 2021-01-30: 2 mL via INTRA_ARTICULAR

## 2021-01-30 MED ORDER — LIDOCAINE HCL 1 % IJ SOLN
2.0000 mL | INTRAMUSCULAR | Status: AC | PRN
  Administered 2021-01-30: 2 mL

## 2021-01-30 MED ORDER — METHYLPREDNISOLONE ACETATE 40 MG/ML IJ SUSP
40.0000 mg | INTRAMUSCULAR | Status: AC | PRN
  Administered 2021-01-30: 40 mg via INTRA_ARTICULAR

## 2021-01-30 NOTE — Telephone Encounter (Signed)
Contacted pt to go over MM results pt is aware and doesn't have any questions or concerns

## 2021-01-30 NOTE — Progress Notes (Signed)
Office Visit Note   Patient: Robin [name]           Date of Birth: 07-Mar-1956           MRN: [id number] Visit Date: 01/30/2021              Requested by: Ladell Pier, MD [address] [place],  [place] [address] PCP: Ladell Pier, MD   Assessment & Plan: Visit Diagnoses:  1. Primary osteoarthritis of both knees   2. Weight loss   3. Body mass index 50.0-59.9, adult ([place])   4. Morbid obesity ([place])     Plan: Impression is advanced degenerative joint disease both knees right greater than left.  Patient would like to repeat cortisone injections today but she is an underlying diabetic so we will proceed with only the right more symptomatic 1 today.  She will follow-up with [name] in the next week or so for left knee cortisone injection.  We will also provide her with [employer] Ar[employer] paperwork to fill out in hopes of being approved for viscosupplementation injection.  We have also discussed referral to weight loss clinic for which she would like.  She will follow-up with [name] as needed.  Follow-Up Instructions: Return if symptoms worsen or fail to improve.   Orders:  Orders Placed This Encounter  Procedures  . Large Joint Inj: R knee  . XR KNEE 3 VIEW RIGHT  . Amb Ref to Medical Weight Management   No orders of the defined types were placed in this encounter.     Procedures: Large Joint Inj: R knee on 01/30/2021 10:43 AM Indications: pain Details: 22 G needle, anterolateral approach Medications: 2 mL lidocaine 1 %; 2 mL bupivacaine 0.25 %; 40 mg methylPREDNISolone acetate 40 MG/ML      Clinical Data: No additional findings.   Subjective: Chief Complaint  Patient presents with  . Right Knee - Pain    HPI patient is a pleasant 65 year old female who comes in today with bilateral knee pain right greater than left for the past 2 months.  This is progressively worsened.  No known injury.  The pain she has is to the entire knee and is described as a constant  ache worse with any movement of the knee.  She is ambulating in a wheelchair at times due to the significance of the pain.  She takes Tylenol as needed.  She is currently in physical therapy and water aerobics.  She does note previous cortisone injections to both knees for over a year ago.  She does note moderate temporary relief from these injections.  She thinks she may have had a left knee gel injection in the past.  Review of Systems as detailed in HPI.  All others reviewed and are negative.   Objective: Vital Signs: Ht 5\' 8"  (1.727 m)   Wt (!) 343 lb (155.6 kg)   BMI 52.15 kg/m   Physical Exam well-developed well-nourished female no acute distress.  Alert and oriented x3.  Ortho Exam bilateral knee exam shows varus deformity.  Range of motion 15 to 95 degrees.  Medial and lateral joint line tenderness on the left, medial joint line tenderness on the right.  She is stable valgus varus stress.  She is neurovascular intact distally.  Specialty Comments:  No specialty comments available.  Imaging: XR KNEE 3 VIEW RIGHT  Result Date: 01/30/2021 Advanced degenerative changes medial and patellofemoral compartments    PMFS History: Patient Active Problem List  Diagnosis Date Noted  . Adjustment disorder with mixed disturbance of emotions and conduct 07/28/2020  . Polyp of descending colon 02/15/2020  . Morbid obesity with BMI of 60.0-69.9, adult ([place]) 07/17/2019  . Pedal edema 07/17/2019  . Multilevel degenerative disc disease 10/31/2018  . Abdominal pain 10/28/2018  . Change in bowel function 10/28/2018  . Severe episode of recurrent major depressive disorder, with psychotic features ([place]) 05/26/2018  . Gastritis and gastroduodenitis 04/26/2018  . Atrophic vaginitis 04/01/2018  . Diabetes mellitus ([place]) 03/31/2018  . Cough, persistent 02/08/2018  . Dysphagia 12/14/2017  . 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 Urinary incontinence 07/23/2017  . Early satiety 06/16/2017  . Hx of iron  deficiency anemia 05/28/2017  . Throat pain in adult 04/05/2017  . Prediabetes 02/04/2017  . Dyspnea on exertion 12/23/2016  . 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 Hemoptysis 12/23/2016  . Fibromyalgia 08/18/2016  . Chronic lymphocytic thyroiditis 08/18/2016  . Depression 04/01/2016  . OSA (obstructive sleep apnea) 04/01/2016  . Essential hypertension 12/09/2015  . Hypothyroidism 12/09/2015  . Morbid obesity due to excess calories ([place]) 12/09/2015  . Constipation 05/27/2015  . Fatty liver 05/27/2015  . Abdominal pain, chronic, epigastric 07/04/2012  . Anxiety 06/09/2012  . History of gastroesophageal reflux (GERD) 06/09/2012  . Hyperlipidemia 06/09/2012  . Refusal of blood transfusions as patient is Jehovah's Witness 06/09/2012  . 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 Abnormal small bowel biopsy 09/18/2011  . Lactose intolerance 09/18/2011  . GERD 01/21/2010   Past Medical History:  Diagnosis Date  . Anxiety disorder   . Arthritis   . Asthma   . Chronic neck pain   . Constipation   . Depression   . Diabetes mellitus without complication ([place])   . Dyspnea   . GERD (gastroesophageal reflux disease)   . Hiatal hernia    small  . Hyperlipidemia   . Hypertension   . Hypothyroidism   . Morbid obesity with BMI of 50.0-59.9, adult ([place])   . Schatzki's ring    non critical  . Sinusitis   . Sleep apnea    CPAP, Sleep study at [place]  . Sleep apnea     Family History  Problem Relation Age of Onset  . Kidney cancer Father   . Hypertension Father   . Hyperlipidemia Father   . Sleep apnea Father   . Diabetes Mother   . Hypertension Mother   . Heart Problems Mother   . Hyperlipidemia Mother   . Asthma Mother   . Sleep apnea Mother   . Liver disease Brother   . Arthritis Brother   . Hypertension Sister   . Allergic rhinitis Sister   . Stomach cancer Other        aunt  . Breast cancer  Maternal Grandmother   . Kidney disease Maternal Grandfather   . Breast cancer Paternal Grandmother   . Hypertension Brother   . Hyperlipidemia Brother   . Hypertension Brother   . Hypertension Sister   . Hyperlipidemia Sister   . Lupus Maternal Aunt   . Eczema Daughter   . Colon cancer Neg Hx   . Immunodeficiency Neg Hx   . Urticaria Neg Hx     Past Surgical History:  Procedure Laterality Date  . ABDOMINAL HYSTERECTOMY    . ABDOMINAL HYSTERECTOMY  02/18/2000  . CARDIAC CATHETERIZATION  08/18/2003   normal L main/LAD/L Cfx/RCA (Dr. Adora Fridge)  . COLONOSCOPY  2006  Dr. [name], hyperplastic polyps  . COLONOSCOPY  08/06/11   abnormal terminal ileum for 10cm, erosions, geographical ulceration. Bx small bowel mucosa with prominent intramucosal lymphoid aggregates, slightly inflammed  . COLONOSCOPY WITH PROPOFOL N/A 12/28/2019   Rourk: 4 sessile serrated adenomas removed on the colon.  Next colonoscopy in 3 years.  Marland Kitchen DIRECT LARYNGOSCOPY  03/15/2012   Procedure: DIRECT LARYNGOSCOPY;  Surgeon: [name], MD;  Location: [place];  Service: ENT;  Laterality: N/A; Dr. Wolicki--:>no foreign body seen. normal esophagus to 40cm  . ESOPHAGEAL DILATION  04/17/2015   Procedure: ESOPHAGEAL DILATION;  Surgeon: [name], MD;  [facility];  Service: Endoscopy;;  . ESOPHAGOGASTRODUODENOSCOPY  08/23/2008   DGL:OVFI distal esophageal erosions consistent with mild erosive reflux esophagitis, otherwise unremarkable esophagus/ Tiny antral erosions of doubtful clinical significance, otherwise normal stomach, patent pylorus, normal D1 and D2  . ESOPHAGOGASTRODUODENOSCOPY  08/06/11   small hh, noncritical Schatzki's ring s/p 60 F  . ESOPHAGOGASTRODUODENOSCOPY N/A 04/17/2015   EPP:IRJJOA s/p dilation  . ESOPHAGOGASTRODUODENOSCOPY (EGD) WITH PROPOFOL N/A 01/20/2018   Dr. [name]: Erosive gastropathy, normal-appearing esophagus status post empiric dilation.  Chronic gastritis, no H. pylori.  .  ESOPHAGOSCOPY  03/15/2012   Procedure: ESOPHAGOSCOPY;  Surgeon: [name], MD;  Location: [place];  Service: ENT;  Laterality: N/A;  . KNEE ARTHROSCOPY  04/27/2001  . MALONEY DILATION N/A 01/20/2018   Procedure: Venia Minks DILATION;  Surgeon: [name], MD;  [facility];  Service: Endoscopy;  Laterality: N/A;  . [place]  2003   negative bruce protocol exercise tress test; EF 68%; intermediate risk study due to evidence of anterior wall ischemia extending from mid-ventricle to apex  . PITUITARY SURGERY  06/2012   benign tumor, surgeon at [facility]  . POLYPECTOMY  12/28/2019   Procedure: POLYPECTOMY;  Surgeon: [name], MD;  [facility];  Service: Endoscopy;;  . RECTOCELE REPAIR    . TRANSTHORACIC ECHOCARDIOGRAM  2003   EF normal  . VAGINAL PROLAPSE REPAIR    . VIDEO BRONCHOSCOPY Bilateral 03/08/2017   Procedure: VIDEO BRONCHOSCOPY WITHOUT FLUORO;  Surgeon: Javier Glazier, MD;  Location: [place];  Service: Cardiopulmonary;  Laterality: Bilateral;   Social History   Occupational History    Employer: UNEMPLOYED  Tobacco Use  . Smoking status: Former Smoker    Packs/day: 0.50    Years: 10.00    Pack years: 5.00    Types: Cigarettes    Start date: 07/14/1975    Quit date: 04/04/1993    Years since quitting: 27.8  . Smokeless tobacco: Never Used  . Tobacco comment: quit 20 yrs ago  Vaping Use  . Vaping Use: Never used  Substance and Sexual Activity  . Alcohol use: No    Alcohol/week: 0.0 standard drinks  . Drug use: No  . Sexual activity: Yes

## 2021-02-03 NOTE — Progress Notes (Deleted)
Virtual Visit via Telephone Note  I connected with Robin [name], on 02/03/2021 at 8:10 AM by telephone due to the COVID-19 pandemic and verified that I am speaking with the correct person using two identifiers.  Due to current restrictions/limitations of in-office visits due to the COVID-19 pandemic, this scheduled clinical appointment was converted to a telehealth visit.   Consent: I discussed the limitations, risks, security and privacy concerns of performing an evaluation and management service by telephone and the availability of in person appointments. I also discussed with the patient that there may be a patient responsible charge related to this service. The patient expressed understanding and agreed to proceed.   Location of Patient: Home  Location of Provider: [facility] [place] participating in Telemedicine visit: Jeremy Johann, NP [place], [place]    History of Present Illness: Robin [name] is a 65 year-old female who presents for home health services requesting CPAP.  Current issues and/or concerns: 1. SLEEP APNEA: Sleep apnea status: {Blank multiple:19196::"uncontrolled","controlled","better","worse","stable","fluctuating"} Duration: {Blank single:19197::"chronic","days","weeks","months"} Satisfied with current treatment?:  {Blank single:19197::"yes","no"} CPAP use:  {Blank single:19197::"yes","no"} Sleep quality with CPAP use: {Blank single:19197::"excellent","good", average","poor"} Treament compliance:{Blank single:19197::"excellent compliance","good compliance","fair compliance","poor compliance"} Last sleep study:  Treatments attempted:  Wakes feeling refreshed:  {Blank single:19197::"yes","no"} Daytime hypersomnolence:  {Blank single:19197::"yes","no"} Fatigue:  {Blank single:19197::"yes","no"} Insomnia:  {Blank single:19197::"yes","no"} Good sleep hygiene:  {Blank single:19197::"yes","no"} Difficulty  falling asleep:  {Blank single:19197::"yes","no"} Difficulty staying asleep:  {Blank single:19197::"yes","no"} Snoring bothers bed partner:  {Blank single:19197::"yes","no"} Observed apnea by bed partner: {Blank single:19197::"yes","no"} Obesity:  {Blank single:19197::"yes","no"} Hypertension: {Blank single:19197::"yes","no"}  Pulmonary hypertension:  {Blank single:19197::"yes","no"} Coronary artery disease:  {Blank single:19197::"yes","no"}    Past Medical History:  Diagnosis Date  . Anxiety disorder   . Arthritis   . Asthma   . Chronic neck pain   . Constipation   . Depression   . Diabetes mellitus without complication ([place])   . Dyspnea   . GERD (gastroesophageal reflux disease)   . Hiatal hernia    small  . Hyperlipidemia   . Hypertension   . Hypothyroidism   . Morbid obesity with BMI of 50.0-59.9, adult ([place])   . Schatzki's ring    non critical  . Sinusitis   . Sleep apnea    CPAP, Sleep study at [place]  . Sleep apnea    Allergies  Allergen Reactions  . Celexa [Citalopram Hydrobromide] Other (See Comments)    "Made me feel out of it"  . Gabapentin Other (See Comments)    Contributed to lower extremity edema  . Norvasc [Amlodipine Besylate] Other (See Comments)    Edema in lower extremities  . Diflucan [Fluconazole] Rash    Current Outpatient Medications on File Prior to Visit  Medication Sig Dispense Refill  . ACCU-CHEK AVIVA PLUS test strip USE AS DIRECTED 100 each 12  . acetaminophen (TYLENOL) 500 MG tablet Take 1,000 mg by mouth 2 (two) times daily.     Marland Kitchen albuterol (VENTOLIN HFA) 108 (90 Base) MCG/ACT inhaler Inhale 1-2 puffs into the lungs every 6 (six) hours as needed for wheezing or shortness of breath. 18 g 0  . aspirin EC 81 MG tablet Take 81 mg by mouth at bedtime.    Marland Kitchen atorvastatin (LIPITOR) 40 MG tablet Take 40 mg by mouth daily.    Marland Kitchen azelastine (ASTELIN) 0.1 % nasal spray 1-2 sprays per nostril 2 times daily as needed (Patient  taking differently: Place  1 spray into both nostrils 2 (two) times daily.) 30 mL 12  . azithromycin (ZITHROMAX) 250 MG tablet Take 1 tablet (250 mg total) by mouth daily. Take first 2 tablets together, then 1 every day until finished. 6 tablet 0  . CALCIUM CARBONATE ANTACID PO Take 2 tablets by mouth See admin instructions. Take 2 tablets by mouth every morning, may also take 2 tablets at night as needed for acid reflux    . celecoxib (CELEBREX) 100 MG capsule TAKE 1 CAPSULE (100 MG TOTAL) BY MOUTH 2 (TWO) TIMES DAILY. 60 capsule 6  . CINNAMON PO Take 1 capsule by mouth daily.    Marland Kitchen conjugated estrogens (PREMARIN) vaginal cream Place 1 Applicatorful vaginally daily as needed (vaginal dryness/itching).    . DEXILANT 60 MG capsule Take 1 capsule by mouth daily.    . diclofenac sodium (VOLTAREN) 1 % GEL Apply 2-4 g topically 4 (four) times daily as needed (pain).    . Diclofenac Sodium 3 % GEL SMARTSIG:1-2 Gram(s) Topical 3-4 Times Daily    . DULoxetine (CYMBALTA) 30 MG capsule TAKE 1 CAPSULE (30 MG TOTAL) BY MOUTH DAILY. 90 capsule 0  . esomeprazole (NEXIUM) 40 MG capsule TAKE 1 CAPSULE (40 MG TOTAL) BY MOUTH 2 (TWO) TIMES DAILY BEFORE A MEAL. 60 capsule 5  . famotidine (PEPCID) 20 MG tablet TAKE 1 TABLET (20 MG TOTAL) BY MOUTH 2 (TWO) TIMES DAILY AS NEEDED FOR HEARTBURN OR INDIGESTION. 60 tablet 3  . fluticasone (FLONASE) 50 MCG/ACT nasal spray Place 1 spray into both nostrils daily. (Patient taking differently: Place 1 spray into both nostrils daily as needed for allergies or rhinitis.) 16 g 1  . fluticasone (FLOVENT HFA) 220 MCG/ACT inhaler INHALE 2 INHALATIONS TWICE DAILY (Patient taking differently: Inhale 2 puffs into the lungs 2 (two) times daily as needed (shortness of breath/wheezing).) 12 g 12  . furosemide (LASIX) 40 MG tablet TAKE 1/2 TO 1 TABLET BY MOUTH DAILY FOR LOWER EXTREMITY SWELLING (Patient taking differently: Take 20 mg by mouth daily.) 90 tablet 2  . gabapentin (NEURONTIN) 300 MG  capsule TAKE 2 CAPSULES (600 MG TOTAL) BY MOUTH 2 (TWO) TIMES DAILY. 60 capsule 0  . glucose blood test strip USE AS INSTRUCTED    . glucose blood test strip USE AS INSTRUCTED    . glucose blood test strip USE AS INSTRUCTED 100 strip 12  . hydrALAZINE (APRESOLINE) 50 MG tablet Take 1 tablet (50 mg total) by mouth 2 (two) times daily. 270 tablet 3  . hydrochlorothiazide (HYDRODIURIL) 25 MG tablet Take 1 tablet (25 mg total) by mouth daily. 90 tablet 3  . levothyroxine (SYNTHROID) 88 MCG tablet TAKE 1 TABLET (88 MCG TOTAL) BY MOUTH DAILY. 30 tablet 2  . linaclotide (LINZESS) 290 MCG CAPS capsule TAKE 1 CAPSULE BY MOUTH DAILY BEFORE BREAKFAST. 90 capsule 3  . losartan (COZAAR) 25 MG tablet Take 25 mg by mouth daily.    . metFORMIN (GLUCOPHAGE) 1000 MG tablet Take 1,000 mg by mouth 2 (two) times daily.    . metFORMIN (GLUCOPHAGE) 500 MG tablet 2 tabs PO Q a.m and 1 tab in evenings 90 tablet 5  . Metoprolol Tartrate 75 MG TABS TAKE 1 TABLET BY MOUTH TWICE DAILY. (Patient taking differently: Take 75 mg by mouth 2 (two) times daily.) 180 tablet 2  . montelukast (SINGULAIR) 10 MG tablet TAKE 1 TABLET BY MOUTH AT BEDTIME. 90 tablet 2  . Multiple Vitamin (MULTIVITAMIN WITH MINERALS) TABS tablet Take 1 tablet by mouth daily.     Marland Kitchen  NARCAN 4 MG/0.1ML LIQD nasal spray kit SMARTSIG:1 Spray(s) Both Nares Once PRN    . Omega-3 Fatty Acids (FISH OIL) 1000 MG CAPS Take 1,000 mg by mouth at bedtime.     . ondansetron (ZOFRAN) 4 MG tablet Take 4 mg by mouth every 8 (eight) hours as needed for nausea or vomiting.    Marland Kitchen OVER THE COUNTER MEDICATION Take 1 capsule by mouth daily. BEET ROOT    . pregabalin (LYRICA) 25 MG capsule Take 25 mg by mouth 2 (two) times daily.    Marland Kitchen spironolactone (ALDACTONE) 25 MG tablet Take 25 mg by mouth daily.    Marland Kitchen tiZANidine (ZANAFLEX) 4 MG tablet Take 4 mg by mouth 2 (two) times daily as needed for muscle spasms.    . TRULICITY 2.50 NL/9.7QB SOPN Inject 0.75 mg into the skin once a week. 9  mL 3  . valACYclovir (VALTREX) 500 MG tablet Take 500 mg by mouth 2 (two) times daily.    . vitamin B-12 (CYANOCOBALAMIN) 1000 MCG tablet Take 1,000 mcg by mouth daily.    . Vitamin D, Ergocalciferol, (DRISDOL) 1.25 MG (50000 UNIT) CAPS capsule Take 50,000 Units by mouth once a week.    . [DISCONTINUED] amitriptyline (ELAVIL) 50 MG tablet TAKE 1 TABLET BY MOUTH AT BEDTIME. 90 tablet 0  . [DISCONTINUED] buPROPion (WELLBUTRIN XL) 300 MG 24 hr tablet TAKE 1 TABLET (300 MG TOTAL) BY MOUTH EVERY MORNING. (Patient taking differently: Take 300 mg by mouth daily.) 90 tablet 2   No current facility-administered medications on file prior to visit.    Observations/Objective: Alert and oriented x 3. Not in acute distress. Physical examination not completed as this is a telemedicine visit.  Assessment and Plan: ***  Follow Up Instructions: ***   Patient was given clear instructions to go to Emergency Department or return to medical center if symptoms don't improve, worsen, or new problems develop.The patient verbalized understanding.  I discussed the assessment and treatment plan with the patient. The patient was provided an opportunity to ask questions and all were answered. The patient agreed with the plan and demonstrated an understanding of the instructions.   The patient was advised to call back or seek an in-person evaluation if the symptoms worsen or if the condition fails to improve as anticipated.     I provided *** minutes total of non-face-to-face time during this encounter.   Camillia Herter, NP  [facility], [place] 02/03/2021, 8:10 AM

## 2021-02-04 ENCOUNTER — Telehealth: Payer: Medicaid Other | Admitting: Family

## 2021-02-07 ENCOUNTER — Encounter: Payer: Self-pay | Admitting: Physical Therapy

## 2021-02-07 ENCOUNTER — Ambulatory Visit: Payer: Medicaid Other | Attending: Internal Medicine | Admitting: Physical Therapy

## 2021-02-07 ENCOUNTER — Other Ambulatory Visit: Payer: Self-pay

## 2021-02-07 ENCOUNTER — Telehealth: Payer: Self-pay

## 2021-02-07 DIAGNOSIS — M6281 Muscle weakness (generalized): Secondary | ICD-10-CM | POA: Diagnosis not present

## 2021-02-07 DIAGNOSIS — M25651 Stiffness of right hip, not elsewhere classified: Secondary | ICD-10-CM | POA: Diagnosis present

## 2021-02-07 DIAGNOSIS — M25652 Stiffness of left hip, not elsewhere classified: Secondary | ICD-10-CM | POA: Insufficient documentation

## 2021-02-07 DIAGNOSIS — R262 Difficulty in walking, not elsewhere classified: Secondary | ICD-10-CM | POA: Insufficient documentation

## 2021-02-07 NOTE — Telephone Encounter (Signed)
Received [employer] application from patient for Monovisc, right knee. Faxed completed [employer]  Application to [phone].

## 2021-02-07 NOTE — Therapy (Signed)
[facility] [address] [place], [place], [address] Phone: [phone]   Fax:  [phone]  Physical Therapy Treatment  Patient Details  Name: Robin [name] MRN: [id number] Date of Birth: January 25, 1956 Referring Provider (PT): Karle Plumber, MD   Encounter Date: 02/07/2021   PT End of Session - 02/07/21 0906    Visit Number 7    Date for PT Re-Evaluation 02/19/21    Authorization Type Medicaid: submitted authorization for visits    Authorization Time Period 12 visits, auth time period 12/11/20-01/21/21    Authorization - Number of Visits 12    PT Start Time 0851    PT Stop Time 0934    PT Time Calculation (min) 43 min    Activity Tolerance Patient limited by pain    Behavior During Therapy [facility] for tasks assessed/performed           Past Medical History:  Diagnosis Date  . Anxiety disorder   . Arthritis   . Asthma   . Chronic neck pain   . Constipation   . Depression   . Diabetes mellitus without complication ([place])   . Dyspnea   . GERD (gastroesophageal reflux disease)   . Hiatal hernia    small  . Hyperlipidemia   . Hypertension   . Hypothyroidism   . Morbid obesity with BMI of 50.0-59.9, adult ([place])   . Schatzki's ring    non critical  . Sinusitis   . Sleep apnea    CPAP, Sleep study at [place]  . Sleep apnea     Past Surgical History:  Procedure Laterality Date  . ABDOMINAL HYSTERECTOMY    . ABDOMINAL HYSTERECTOMY  02/18/2000  . CARDIAC CATHETERIZATION  08/18/2003   normal L main/LAD/L Cfx/RCA (Dr. Adora Fridge)  . COLONOSCOPY  2006   Dr. [name], hyperplastic polyps  . COLONOSCOPY  08/06/11   abnormal terminal ileum for 10cm, erosions, geographical ulceration. Bx small bowel mucosa with prominent intramucosal lymphoid aggregates, slightly inflammed  . COLONOSCOPY WITH PROPOFOL N/A 12/28/2019   Rourk: 4 sessile serrated adenomas removed on the colon.  Next colonoscopy in 3  years.  Marland Kitchen DIRECT LARYNGOSCOPY  03/15/2012   Procedure: DIRECT LARYNGOSCOPY;  Surgeon: [name], MD;  Location: [facility];  Service: ENT;  Laterality: N/A; Dr. Wolicki--:>no foreign body seen. normal esophagus to 40cm  . ESOPHAGEAL DILATION  04/17/2015   Procedure: ESOPHAGEAL DILATION;  Surgeon: [name], MD;  [facility];  Service: Endoscopy;;  . ESOPHAGOGASTRODUODENOSCOPY  08/23/2008   JHE:RDEY distal esophageal erosions consistent with mild erosive reflux esophagitis, otherwise unremarkable esophagus/ Tiny antral erosions of doubtful clinical significance, otherwise normal stomach, patent pylorus, normal D1 and D2  . ESOPHAGOGASTRODUODENOSCOPY  08/06/11   small hh, noncritical Schatzki's ring s/p 13 F  . ESOPHAGOGASTRODUODENOSCOPY N/A 04/17/2015   CXK:GYJEHU s/p dilation  . ESOPHAGOGASTRODUODENOSCOPY (EGD) WITH PROPOFOL N/A 01/20/2018   Dr. [name]: Erosive gastropathy, normal-appearing esophagus status post empiric dilation.  Chronic gastritis, no H. pylori.  . ESOPHAGOSCOPY  03/15/2012   Procedure: ESOPHAGOSCOPY;  Surgeon: [name], MD;  Location: [facility];  Service: ENT;  Laterality: N/A;  . KNEE ARTHROSCOPY  04/27/2001  . MALONEY DILATION N/A 01/20/2018   Procedure: Venia Minks DILATION;  Surgeon: [name], MD;  [facility];  Service: Endoscopy;  Laterality: N/A;  . NM MYOCAR PERF WALL MOTION  2003   negative bruce protocol exercise tress test; EF 68%; intermediate risk  study due to evidence of anterior wall ischemia extending from mid-ventricle to apex  . PITUITARY SURGERY  06/2012   benign tumor, surgeon at [facility]  . POLYPECTOMY  12/28/2019   Procedure: POLYPECTOMY;  Surgeon: [name], MD;  [facility];  Service: Endoscopy;;  . RECTOCELE REPAIR    . TRANSTHORACIC ECHOCARDIOGRAM  2003   EF normal  . VAGINAL PROLAPSE REPAIR    . VIDEO BRONCHOSCOPY Bilateral 03/08/2017   Procedure: VIDEO BRONCHOSCOPY WITHOUT FLUORO;  Surgeon: Javier Glazier, MD;  Location: [facility];  Service: Cardiopulmonary;  Laterality: Bilateral;    There were no vitals filed for this visit.   Subjective Assessment - 02/07/21 0908    Subjective Had a knee injection in RT knee and will have the LT done next week.    Pertinent History use of motorized W/C due to weakness and LE pain.    Currently in Pain? Yes    Pain Score 7     Pain Location Knee    Pain Orientation Right;Left    Pain Descriptors / Indicators Sore    Aggravating Factors  weightbearing    Pain Relieving Factors meds, NWB    Multiple Pain Sites No                             [facility] Adult PT Treatment/Exercise - 02/07/21 0001      Ambulation/Gait   Ambulation/Gait Yes    Ambulation/Gait Assistance 5: Supervision    Assistive device Rolling walker    Gait Comments 50 feet x 4      Posture/Postural Control   Posture/Postural Control Postural limitations    Postural Limitations Forward head;Rounded Shoulders;Flexed trunk   Vc for the most erect posture she can   Posture Comments bil knee flexion in standing      Knee/Hip Exercises: Aerobic   Nustep L1 x 10 min                    PT Short Term Goals - 01/08/21 0946      PT SHORT TERM GOAL #1   Title independent with initial HEP    Status Achieved      PT SHORT TERM GOAL #2   Title walk with rolling walker x 30 feet and demonstrate moderate shortness of breath    Baseline Pt reported mod SOB, did use inhaler    Status Achieved      PT SHORT TERM GOAL #3   Title initiate a walking program at home to improve ambulation and begin to wean from wheelchair use at home    Baseline every other day walking within home using walker    Status Achieved             PT Long Term Goals - 02/07/21 0923      PT LONG TERM GOAL #1   Title Pt will be independent with progression of final HEP    Time 8    Period Weeks      PT LONG TERM GOAL #2   Title improve strength to wean from  wheelchair use > or = to 30% of the time for home distances    Baseline still using w/c 90% of the time    Time 8    Period Weeks    Status On-going   Uses walker in the home 2-3x week     PT LONG TERM GOAL #4  Title improve strength and endurance to walk 50-75 feet with rolling walker with moderate shortness of breath    Time 8    Period Weeks    Status Achieved                 Plan - 02/07/21 0917    Clinical Impression Statement Pt arrives with 7/10 knee pain. She received an injection into her Rt knee last week which helped "a little." Pt was able to increaseher time on the Nustep ( double her time ) and ambulated with RW 50 ft 4x with 2-3 min seated rest breaks in between walking set mainly to use her inhaler secondary to "getting out of breath."    Personal Factors and Comorbidities Comorbidity 3+    Examination-Activity Limitations Stand;Stairs;Squat;Locomotion Level;Transfers;Dressing;Hygiene/Grooming    Stability/Clinical Decision Making Evolving/Moderate complexity    PT Frequency 2x / week    PT Duration 6 weeks    PT Treatment/Interventions ADLs/Self Care Home Management;Cryotherapy;Moist Heat;Neuromuscular re-education;Therapeutic exercise;Therapeutic activities;Functional mobility training;Stair training;Gait training;Patient/family education;Wheelchair mobility training;Manual techniques;Energy conservation;Passive range of motion;Taping;Aquatic Therapy    PT Next Visit Plan continue standing tolerance with erect posture, weight shifting, standing ther ex as tol to reach 5', gait endurance, LE flexibility for hip flexors and hamstrings    PT Home Exercise Plan Access Code: [facility]           Patient will benefit from skilled therapeutic intervention in order to improve the following deficits and impairments:  Abnormal gait,Decreased activity tolerance,Decreased balance,Decreased strength,Postural dysfunction,Impaired flexibility,Hypomobility,Pain,Decreased  endurance,Difficulty walking,Decreased range of motion  Visit Diagnosis: Muscle weakness (generalized)  Difficulty in walking, not elsewhere classified  Stiffness of left hip, not elsewhere classified  Stiffness of right hip, not elsewhere classified     Problem List Patient Active Problem List   Diagnosis Date Noted  . Adjustment disorder with mixed disturbance of emotions and conduct 07/28/2020  . Polyp of descending colon 02/15/2020  . Morbid obesity with BMI of 60.0-69.9, adult ([place]) 07/17/2019  . Pedal edema 07/17/2019  . Multilevel degenerative disc disease 10/31/2018  . Abdominal pain 10/28/2018  . Change in bowel function 10/28/2018  . Severe episode of recurrent major depressive disorder, with psychotic features ([place]) 05/26/2018  . Gastritis and gastroduodenitis 04/26/2018  . Atrophic vaginitis 04/01/2018  . Diabetes mellitus ([place]) 03/31/2018  . Cough, persistent 02/08/2018  . Dysphagia 12/14/2017  . 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 Urinary incontinence 07/23/2017  . Early satiety 06/16/2017  . Hx of iron deficiency anemia 05/28/2017  . Throat pain in adult 04/05/2017  . Prediabetes 02/04/2017  . Dyspnea on exertion 12/23/2016  . 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 Hemoptysis 12/23/2016  . Fibromyalgia 08/18/2016  . Chronic lymphocytic thyroiditis 08/18/2016  . Depression 04/01/2016  . OSA (obstructive sleep apnea) 04/01/2016  . Essential hypertension 12/09/2015  . Hypothyroidism 12/09/2015  . Morbid obesity due to excess calories ([place]) 12/09/2015  . Constipation 05/27/2015  . Fatty liver 05/27/2015  . Abdominal pain, chronic, epigastric 07/04/2012  . Anxiety 06/09/2012  . History of gastroesophageal reflux (GERD) 06/09/2012  . Hyperlipidemia 06/09/2012  . Refusal of blood transfusions as patient is Jehovah's Witness 06/09/2012  . 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 Abnormal small bowel  biopsy 09/18/2011  . Lactose intolerance 09/18/2011  . GERD 01/21/2010    Ourania Hamler, PTA 02/07/2021, 10:02 AM  [facility] [address] [place], [place], [address] Phone: [phone]   Fax:  [phone]  Name: JANNA OAK MRN: [id number] Date of Birth: [birth year]

## 2021-02-10 ENCOUNTER — Other Ambulatory Visit: Payer: Self-pay

## 2021-02-10 ENCOUNTER — Encounter: Payer: Medicaid Other | Attending: Internal Medicine | Admitting: Dietician

## 2021-02-10 ENCOUNTER — Encounter: Payer: Self-pay | Admitting: Dietician

## 2021-02-10 DIAGNOSIS — E119 Type 2 diabetes mellitus without complications: Secondary | ICD-10-CM | POA: Insufficient documentation

## 2021-02-10 DIAGNOSIS — E118 Type 2 diabetes mellitus with unspecified complications: Secondary | ICD-10-CM | POA: Insufficient documentation

## 2021-02-10 NOTE — Progress Notes (Signed)
Diabetes Self-Management Education  Visit Type: First/Initial  Appt. Start Time: 1115 Appt. End Time: 1230  02/10/2021  [name], identified by name and date of birth, is a 65 y.o. female with a diagnosis of Diabetes: Type 2.   ASSESSMENT Patient is here today alone.  She was last seen by another RD 10/14/2017 for GERD.  She states that she would like to learn more about diabetes.  History includes  Type 2 diabetes, neuropathy, HTN, HLD, hypothyroidism Medications inlcude Metformin, Trulicity, lasix spirolactone, Vitamin B-12, vitamin D, MVI, Linzess, cinnamon A1C 6.7% 11/24/2020  Weight hx: 335 lbs 02/10/2021 319 lbs 10/2017 350 highest adult weight 175 lbs lowest adult weight Gained weight with pregnancy and continued to gain after last baby and increased stress at that point.  She stated that food was her comfort.  She became less active.  Had increased depression. Asked patient if husband is trying to keep patient from losing weight.  She states that she has asked this question as well but does not feel that he is.  Patient lives with her husband.  He does most of the shopping and cooking.  Patient states that her husband is helpful and tries to stick to a healthy plan often but get off at times.  Patient states that snacking is her problem.  Her husband has stage 2 lung cancer (started in kidney). Increased stress due to relationship at times. She is working on disability.  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 Patient used to walk but now uses an electric wheel chair due to arthritis.   She states that she needs knee surgery but does not currently qualify due to her weight. She receives steroid shots in her knees weekly. She has been referred to [facility]. She goes to physical therapy weekly. She does the PT exercises and/or armchair exercises daily.  Increased frequency of exercise makes her hurt more.  Height 5\' 8"  (1.727 m), weight (!) 335 lb (152  kg). Body mass index is 50.94 kg/m.   Diabetes Self-Management Education - 02/10/21 1154      Visit Information   Visit Type First/Initial      Initial Visit   Diabetes Type Type 2    Are you currently following a meal plan? No    Are you taking your medications as prescribed? Yes    Date Diagnosed 2020      Health Coping   How would you rate your overall health? Fair      Psychosocial Assessment   Patient Belief/Attitude about Diabetes Motivated to manage diabetes    Self-care barriers None    Self-management support Doctor's office    Other persons present Patient    Patient Concerns Nutrition/Meal planning    Special Needs None    Preferred Learning Style No preference indicated    Learning Readiness Ready    How often do you need to have someone help you when you read instructions, pamphlets, or other written materials from your doctor or pharmacy? 1 - Never    What is the last grade level you completed in school? 10th      Pre-Education Assessment   Patient understands the diabetes disease and treatment process. Needs Instruction    Patient understands incorporating nutritional management into lifestyle. Needs Instruction    Patient undertands incorporating physical activity into lifestyle. Needs Instruction    Patient understands using medications safely. Needs Instruction    Patient understands monitoring blood glucose, interpreting  and using results Needs Instruction    Patient understands prevention, detection, and treatment of acute complications. Needs Instruction    Patient understands prevention, detection, and treatment of chronic complications. Needs Instruction    Patient understands how to develop strategies to address psychosocial issues. Needs Instruction    Patient understands how to develop strategies to promote health/change behavior. Needs Instruction      Complications   Last HgB A1C per patient/outside source 6.7 %   12/02/2020   How often do you  check your blood sugar? 1-2 times/day    Fasting Blood glucose range (mg/dL) 70-129;130-179   130-136   Number of hypoglycemic episodes per month 0    Number of hyperglycemic episodes per week 0    Have you had a dilated eye exam in the past 12 months? Yes    Have you had a dental exam in the past 12 months? Yes    Are you checking your feet? Yes    How many days per week are you checking your feet? 5      Dietary Intake   Breakfast honey nut cheerios, banana, coffee with sweetened creamer, occasional pear   8-9   Snack (morning) none    Lunch skips    Snack (afternoon) sandwich (ham on [employer] bread) OR leftovers   2-3   Dinner baked skinless chicken, mashed potatoes, broccoli   6-6:30   Snack (evening) 3-4 cookies (Pecan Sandies), berries, nuts    Beverage(s) coffee with sweetened creamer, flavored water, water, regular gingerale (one- two 8 oz cans)      Exercise   Exercise Type ADL's      Patient Education   Previous Diabetes Education Yes (please comment)   2019 for GERD   Disease state  Definition of diabetes, type 1 and 2, and the diagnosis of diabetes    Nutrition management  Role of diet in the treatment of diabetes and the relationship between the three main macronutrients and blood glucose level;Meal options for control of blood glucose level and chronic complications.    Physical activity and exercise  Role of exercise on diabetes management, blood pressure control and cardiac health.    Medications Reviewed patients medication for diabetes, action, purpose, timing of dose and side effects.    Monitoring Identified appropriate SMBG and/or A1C goals.;Taught/discussed recording of test results and interpretation of SMBG.;Daily foot exams;Yearly dilated eye exam    Acute complications Taught treatment of hypoglycemia - the 15 rule.;Discussed and identified patients' treatment of hyperglycemia.    Chronic complications Relationship between chronic complications and blood glucose  control    Psychosocial adjustment Role of stress on diabetes      Individualized Goals (developed by patient)   Nutrition General guidelines for healthy choices and portions discussed    Physical Activity Exercise 3-5 times per week;30 minutes per day    Medications take my medication as prescribed    Monitoring  test my blood glucose as discussed    Reducing Risk increase portions of healthy fats;examine blood glucose patterns    Health Coping discuss diabetes with (comment)   MD, RD, CDCES     Post-Education Assessment   Patient understands the diabetes disease and treatment process. Demonstrates understanding / competency    Patient understands incorporating nutritional management into lifestyle. Needs Review    Patient undertands incorporating physical activity into lifestyle. Demonstrates understanding / competency    Patient understands using medications safely. Demonstrates understanding / competency    Patient understands monitoring  blood glucose, interpreting and using results Demonstrates understanding / competency    Patient understands prevention, detection, and treatment of acute complications. Demonstrates understanding / competency    Patient understands prevention, detection, and treatment of chronic complications. Demonstrates understanding / competency    Patient understands how to develop strategies to address psychosocial issues. Demonstrates understanding / competency    Patient understands how to develop strategies to promote health/change behavior. Needs Review      Outcomes   Expected Outcomes Demonstrated interest in learning. Expect positive outcomes    Future DMSE 2 months    Program Status Not Completed           Individualized Plan for Diabetes Self-Management Training:   Learning Objective:  Patient will have a greater understanding of diabetes self-management. Patient education plan is to attend individual and/or group sessions per assessed needs and  concerns.   Plan:   Patient Instructions  Rethink your drink. Aim for beverages without carbohydrates. Consider reading labels and stick to the portion on the label. Before snacking at night, ask," am I really hungry or eating for another reason?"  What could you do instead?  Mindfulness  Choose mindfully  Eat slowly  Stop when satisfied  Eat away from distraction such as TV  Consistent meals Consider "Mini meal" rather than skipping lunch. Consider nutrition density of the food you choose.    Expected Outcomes:  Demonstrated interest in learning. Expect positive outcomes  Education material provided: ADA - How to Thrive: A Guide for Your Journey with Diabetes, Meal plan card, My Plate and Snack sheet; Planning Healthy Meals  If problems or questions, patient to contact team via:  Phone  Future DSME appointment: 2 months

## 2021-02-10 NOTE — Patient Instructions (Signed)
Rethink your drink. Aim for beverages without carbohydrates. Consider reading labels and stick to the portion on the label. Before snacking at night, ask," am I really hungry or eating for another reason?"  What could you do instead?  Mindfulness  Choose mindfully  Eat slowly  Stop when satisfied  Eat away from distraction such as TV  Consistent meals Consider "Mini meal" rather than skipping lunch. Consider nutrition density of the food you choose.

## 2021-02-11 ENCOUNTER — Ambulatory Visit: Payer: Medicaid Other | Admitting: Internal Medicine

## 2021-02-11 NOTE — Progress Notes (Deleted)
Subjective:     Patient ID: Robin [name], female   DOB: 05/12/1956       MRN: [id number]     Brief patient profile:  64  yobf quit smoking in 1994 seen previously for dyspnea, chronic allergic rhinitis and hemoptysis (felt secondary to erosions from tonsils status post bronchoscopy) - has sensation of globus/ dysphagia dating back at least to 2009   Has seen Dr Hicks for pos allergies but does not recall details (even symptoms)      TESTS  PFT 02/09/17: FVC  2.35 L (77%) FEV1 2.06 L (85%) FEV1/FVC 0.88 FEF 25-75 3.32 L (142%) negative bronchodilator response TLC 3.74 L (68%) RV 71% ERV 28% DLCO corrected 71% 08/22/15: FVC 2.32 L (119%) FEV1 1.99 L (121%) FEV1/FVC 0.85 FEF 25-75 2.59 L (118%)  6MWT 02/16/17:  Walked 126 meters / Baseline Sat 99%  On RA / Nadir Sat 99% on RA  METHACHOLINE CHALLENGE TEST (02/24/17):  Normal bronchial hyperreactivity.  IMAGING BARIUM SWALLOW/ESOPHAGRAM 04/30/17 (per radiologist):  Hiatal hernia with mild reflux. Esophageal dysmotility  CT NECK/SOFT TISSUE W/ CONTRAST 04/19/17 (per radiologist): No inflammatory change or foreign body can be seen in the pharynx.  Cervical spondylosis. 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  CT CHEST W/O 01/07/17   No parenchymal nodule, mass, or opacity appreciated. No pleural effusion or thickening. No pericardial effusion. No pathologic mediastinal adenopathy.  CTA CHEST 12/13/15   No pulmonary emboli. No pleural effusion or thickening. No pericardial effusion. No pathologic mediastinal adenopathy. No parenchymal nodule, mass, or opacification.  CARDIAC TTE (09/29/16): LV normal in size with moderate concentric hypertrophy. EF 55-60% with no regional wall motion abnormalities. Indeterminant diastolic function. LA & RA normal in size. RV normal in size and function. No aortic stenosis or regurgitation. Aortic root normal in size. No mitral stenosis or regurgitation. No  significant pulmonic regurgitation. No significant tricuspid regurgitation. No pericardial effusion.  LEFT HEART CATHETERIZATION (11/13/3):  Aortic systolic pressure 185  Diastolic pressure 90  Left ventricular systolic pressure 181  End-diastolic pressure 27 left main coronary artery normal  Left anterior descending artery normal  Left circumflex coronary artery normal  Right coronary artery dominant and normal   Overall estimated ejection fraction greater than 60% without focal wall motion abnormality  MICROBIOLOGY Endobronchial/Endotracheal Brushing 03/08/17: Rare Haemophilus influenza beta lactamase positive  PATHOLOGY Endobronchial/Endotracheal Brushing 03/08/17: No malignancy. Negative for herpes & HPV.  LABS 05/28/17 BNP:  6.1 ESR:  34 TSH:  1.060 Anti-CCP:  6 RF:  <10  02/16/17 IgE: 81 RAST panel: Cockroach 0.2 / D. farinae 0.2 / D. pteronyssinus 0.24   12/23/16 INR: 1.0 PTT: 24.1 Chromatin Ab:  <0.2 Smith Ab: <0.2 DS DNA Ab:  <1 SSA:  <0.2 SSB:  <0.2 Anti-CCP:  <16 SCL-70:  <0.2 ENA RNP Ab: Centromere Ab Screen:  <0.2 Jo-1 Ab:  <0.2  05/12/16 ANA: Negative Rheumatoid factor:  <10     02/08/2018 acute extended ov/Robin [name] re:  acute on chronic cough x decades / establish with me ([facility] pt) Chief Complaint  Patient presents with  . Acute Visit    Pt c/o cough with yellow, wheezing and SOB x 3 days. She states her chest feels sore when she coughs. She has been using her albuterol inahler 3 x daily on average.   baseline = maint protonix right before first meal then before supper, and singuliar and needing saba  3 x  Times a day but not noct then severe   cough /subj  wheeze/ chest soreness 3 days prior to OV and sob with any activity  rec Prednisone 10 mg take  4 each am x 2 days,   2 each am x 2 days,  1 each am x 2 days and stop  zpak Protonix 40  Mg Take 30- 60 min before your first and last meals of the day  For drainage / throat tickle try take  CHLORPHENIRAMINE  4 mg - take one every 4 hours as needed   Take delsym two tsp every 12 hours and supplement if needed with  tramadol 50 mg up to 2 every 4 hours GERD  Diet   Please schedule a follow up office visit in 4 weeks, sooner if needed  with all medications /inhalers/ solutions in hand so we can verify exactly what you are taking. This includes all medications from all doctors and over the counters      03/08/2018  f/u ov/Robin [name] re:  Cough x decades / did not bring all meds but most of them  Chief Complaint  Patient presents with  . Follow-up    Breathing is unchanged. She is still coughing up yellow sputum. She is using her albuterol inhaler 3 x per wk on average.   Dyspnea:  Even if not coughing doe x room to room  X  Years = MMRC3 = can't walk 100 yards even at a slow pace at a flat grade s stopping due to sob  = stops a lot to shop  Cough: fits of cough are  Sporadic daytime mostly  Sleep: she says fine, husband hears wheezing  SABA use:  Doesn't really help/ note MCT neg  rec Stop corevidol and losartan and instead try bisoprolol 5 mg twice daily  For cough try tessalon 200 mg every 8 hours as needed  For drainage / throat tickle try take CHLORPHENIRAMINE  4 mg - take one every 4 hours as needed   Please see patient coordinator before you leave today  to schedule sinus CT> never done denied by insurance          07/19/2018  Acute extended  ov/Robin [name] re: cough / dysphagia/ globus > 10 y worse since off gabapentin 300 mg x 4 weeks with worse doe also / did not bring updated med calendar as requested  Chief Complaint  Patient presents with  . Acute Visit    Increased SOB and cough x 3-4 wks. Her cough is occ prod with green to yellow sputum.  She also c/o hoarseness.   Dyspnea:   MMRC3 = can't walk 100 yards even at a slow pace at a flat grade s stopping due to sob  - can't do  [employer] Cough:  sporadic Sleeping: sleeps fine bed flat/ 2 pillows  SABA use: none Says  tessalon works the best for cough rec Add back gabapentin 300 mg four times a day as per med calendar Change tessilon to 200 mg up to every 8 hours as needed for cough  and stop delsym If can't stop coughing then add hydrocodone up to every 4 hours if needed  If not better next step is to return to ENT ([facility]) then if not satisfied please return here  with all medications /inhalers/ solutions     05/11/2019  f/u ov/Robin [name] re: uacs / MO with doe /brought meds but they don't correlate well with med list  Chief Complaint  Patient presents with  . Follow-up    Patient reports that   her sob has increased and she still has a dry cough.   Dyspnea:  Was able to walk at [employer], now using scooter x 2 month Cough: still severe cough day > noct, dry > wet with globus sensatin and hoarseness but still subj "wheezeling"  Sleeping: sleeps ok  in flat bed, several pillows SABA use: no better with saba  02: none  Overt hb despite dexilant / no longer on aciphex though still listed rec Prednisone 10 mg take  4 each am x 2 days,   2 each am x 2 days,  1 each am x 2 days and stop  GERD (REFLUX)   For congestion ok to chlotrabs 4 mg x 2 every hours as needed  We will  schedule ENT eval/ [facility] re your cough    12/04/2019  f/u ov/Robin [name] re: uacs  Chief Complaint  Patient presents with  . Follow-up    Increased cough and congestion x 4 months. She will occ produce some yellow to green sputum. She is using her albuterol inhaler about 2 x per day on average.   Dyspnea:  Uses walker across the room then stops < 25 ft x months  Cough: esp daytime min mucoid Sleeping: on side flat bed 4 pillows  SABA use: 2 different albuterols/ easily confused with details of care  02: none  rec Prednisone 10 mg take  4 each am x 2 days,   2 each am x 2 days,  1 each am x 2 days and stop  Try dexilant 60 mg   Take 30-60 min before first meal of the day and Pepcid ac (famotidine) 20 mg one after supper until return to office   GERD diet  For congestion ok to chlotrabs 4 mg x 2 every hours as needed  Only use your albuterol as a rescue medication to be used if you can't catch your breath  See  ENT eval/ [facility] re your cough and ears   01/15/2020  f/u ov/Robin [name] re: uacs/ ? Allergic rhinitis with pnds (w/u by Hicks previously and see last RAST 2018 ) / did not bring chlortabs but says taking 2 bid Chief Complaint  Patient presents with  . Follow-up    Coughing less since the last visit but she states her SOB and wheezing seem worse. She is using her albuterol inhaler at least 3 x per day.   Dyspnea: room to room really no better p pred / more limited by L hip/knee pain Cough: better p prednisone, some worse since completed Sleeping: flat bed with 4 pillows wakes up to br p 3-4 h and cough and wheeze when walking to BR SABA use: during the day with activity (0% effective technique - see a/p)  02: none  rec Work on inhaler technique:  relax and gently blow all the way out then take a nice smooth deep breath back in, triggering the inhaler at same time you start breathing in.  Rinse and gargle with water when done Try albuterol (ventolin) 15 min before an activity that you know would make you short of breath and see if it makes any difference and if makes none then don't take it after activity unless you can't catch your breath. If your breathing gets worse > Prednisone 10 mg take  4 each am x 2 days,   2 each am x 2 days,  1 each am x 2 days and stop  For drainage / throat tickle try take CHLORPHENIRAMINE  4 mg  (  Chlortab '4mg'$   at [employer] should be easiest to find in the green box)  take one-two  every 4 hours as needed - available over the counter- may cause drowsiness so start with just a bedtime dose or two and see how you tolerate it before trying in daytime        04/19/2020  f/u ov/Robin [name] re: sob/cough x 2009 / had both covid  shots Chief Complaint  Patient presents with  . Follow-up  Dyspnea: 25 ft  and stops due to sob / knees hurt  Cough: some worse mostly daytime/ smells trigger esp cig smoke/ sense of pnds / resolves with prednisone short term   Sleeping:  30 degrees elevation hob with electric bed does fine unless needs to get up to pee  SABA use: avg once a day  rec See Dr [name] or her group and let them know prednisone helps your symptoms Please schedule a follow up visit in 3 months with PFTs on return -  but call sooner if needed    02/11/2021  f/u ov/Robin [name] re:  No chief complaint on file.   Dyspnea:  *** Cough: *** Sleeping: *** SABA use: *** 02: *** Covid status:   ***   No obvious day to day or daytime variability or assoc excess/ purulent sputum or mucus plugs or hemoptysis or cp or chest tightness, subjective wheeze or overt sinus or hb symptoms.   *** without nocturnal  or early am exacerbation  of respiratory  c/o's or need for noct saba. Also denies any obvious fluctuation of symptoms with weather or environmental changes or other aggravating or alleviating factors except as outlined above   No unusual exposure hx or h/o childhood pna/ asthma or knowledge of premature birth.  Current Allergies, Complete Past Medical History, Past Surgical History, Family History, and Social History were reviewed in [employer] record.  ROS  The following are not active complaints unless bolded Hoarseness, sore throat, dysphagia, dental problems, itching, sneezing,  nasal congestion or discharge of excess mucus or purulent secretions, ear ache,   fever, chills, sweats, unintended wt loss or wt gain, classically pleuritic or exertional cp,  orthopnea pnd or arm/hand swelling  or leg swelling, presyncope, palpitations, abdominal pain, anorexia, nausea, vomiting, diarrhea  or change in bowel habits or change in bladder habits, change in stools or change in urine, dysuria, hematuria,  rash, arthralgias, visual complaints, headache, numbness, weakness or ataxia or  problems with walking or coordination,  change in mood or  memory.        No outpatient medications have been marked as taking for the 02/11/21 encounter (Appointment) with Tanda Rockers, MD.          Objective:   Physical Exam     02/11/2021        *** 04/19/2020         335  01/15/2020         354  12/04/2019           355  05/11/2019           331  07/19/2018       328  03/08/2018            337   02/08/18 (!) 333 lb (151 kg)  01/27/18 (!) 328 lb 9.6 oz (149.1 kg)  12/14/17 (!) 326 lb 6.4 oz (148.1 kg)      Vital signs reviewed  02/11/2021  - Note at rest 02 sats  ***%  on ***   General appearance:    ***        Assessment:

## 2021-02-12 ENCOUNTER — Encounter: Payer: Self-pay | Admitting: Podiatry

## 2021-02-12 ENCOUNTER — Other Ambulatory Visit: Payer: Self-pay

## 2021-02-12 ENCOUNTER — Ambulatory Visit (INDEPENDENT_AMBULATORY_CARE_PROVIDER_SITE_OTHER): Payer: Medicaid Other | Admitting: Podiatry

## 2021-02-12 DIAGNOSIS — M79674 Pain in right toe(s): Secondary | ICD-10-CM

## 2021-02-12 DIAGNOSIS — E1142 Type 2 diabetes mellitus with diabetic polyneuropathy: Secondary | ICD-10-CM

## 2021-02-12 DIAGNOSIS — M79675 Pain in left toe(s): Secondary | ICD-10-CM | POA: Diagnosis not present

## 2021-02-12 DIAGNOSIS — B351 Tinea unguium: Secondary | ICD-10-CM

## 2021-02-12 DIAGNOSIS — E119 Type 2 diabetes mellitus without complications: Secondary | ICD-10-CM

## 2021-02-12 NOTE — Progress Notes (Signed)
ANNUAL DIABETIC FOOT EXAM  Subjective: Robin [name] presents today for for annual diabetic foot examination, at risk foot care with history of diabetic neuropathy and painful thick toenails that are difficult to trim. Pain interferes with ambulation. Aggravating factors include wearing enclosed shoe gear. Pain is relieved with periodic professional debridement.  Patient relates 2 year h/o diabetes.  Patient relates no h/o foot wounds.  Patient does have symptoms of foot numbness.  Patient does have symptoms of foot tingling.  Patient does have symptoms of burning in feet.  Patient does have symptoms of pins/needles in feet.  Her neuropathy is managed with pregabalin.  Ladell Pier, MD is patient's PCP. Last visit was 11/29/2020.Marland Kitchen  Past Medical History:  Diagnosis Date  . Anxiety disorder   . Arthritis   . Asthma   . Chronic neck pain   . Constipation   . Depression   . Diabetes mellitus without complication ([place])   . Dyspnea   . GERD (gastroesophageal reflux disease)   . Hiatal hernia    small  . Hyperlipidemia   . Hypertension   . Hypothyroidism   . Morbid obesity with BMI of 50.0-59.9, adult ([place])   . Schatzki's ring    non critical  . Sinusitis   . Sleep apnea    CPAP, Sleep study at [place]  . Sleep apnea    Patient Active Problem List   Diagnosis Date Noted  . Adjustment disorder with mixed disturbance of emotions and conduct 07/28/2020  . Polyp of descending colon 02/15/2020  . Pituitary tumor 08/21/2019  . Primary osteoarthritis of both hips 08/21/2019  . Primary osteoarthritis of both knees 08/21/2019  . Pain in left shoulder 08/21/2019  . Morbid obesity with BMI of 60.0-69.9, adult ([place]) 07/17/2019  . Pedal edema 07/17/2019  . Multilevel degenerative disc disease 10/31/2018  . Abdominal pain 10/28/2018  . Change in bowel function 10/28/2018  . Severe episode of recurrent major depressive disorder, with psychotic features  ([place]) 05/26/2018  . Gastritis and gastroduodenitis 04/26/2018  . Atrophic vaginitis 04/01/2018  . Diabetes mellitus ([place]) 03/31/2018  . Cough, persistent 02/08/2018  . Dysphagia 12/14/2017  . 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 Urinary incontinence 07/23/2017  . Early satiety 06/16/2017  . Hx of iron deficiency anemia 05/28/2017  . Throat pain in adult 04/05/2017  . Prediabetes 02/04/2017  . Dyspnea on exertion 12/23/2016  . 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 Hemoptysis 12/23/2016  . Fibromyalgia 08/18/2016  . Chronic lymphocytic thyroiditis 08/18/2016  . Depression 04/01/2016  . OSA (obstructive sleep apnea) 04/01/2016  . Essential hypertension 12/09/2015  . Hypothyroidism 12/09/2015  . Morbid obesity due to excess calories ([place]) 12/09/2015  . Constipation 05/27/2015  . Fatty liver 05/27/2015  . Abdominal pain, chronic, epigastric 07/04/2012  . Anxiety 06/09/2012  . History of gastroesophageal reflux (GERD) 06/09/2012  . Hyperlipidemia 06/09/2012  . Refusal of blood transfusions as patient is Jehovah's Witness 06/09/2012  . 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 Abnormal small bowel biopsy 09/18/2011  . Lactose intolerance 09/18/2011  . GERD 01/21/2010   Past Surgical History:  Procedure Laterality Date  . ABDOMINAL HYSTERECTOMY    . ABDOMINAL HYSTERECTOMY  02/18/2000  . CARDIAC CATHETERIZATION  08/18/2003   normal L main/LAD/L Cfx/RCA (Dr. Adora Fridge)  . COLONOSCOPY  2006   Dr. [name], hyperplastic polyps  . COLONOSCOPY  08/06/11   abnormal terminal ileum for 10cm, erosions, geographical ulceration. Bx small bowel  mucosa with prominent intramucosal lymphoid aggregates, slightly inflammed  . COLONOSCOPY WITH PROPOFOL N/A 12/28/2019   Rourk: 4 sessile serrated adenomas removed on the colon.  Next colonoscopy in 3 years.  Marland Kitchen DIRECT LARYNGOSCOPY  03/15/2012   Procedure: DIRECT LARYNGOSCOPY;  Surgeon: [name],  MD;  Location: [facility];  Service: ENT;  Laterality: N/A; Dr. Wolicki--:>no foreign body seen. normal esophagus to 40cm  . ESOPHAGEAL DILATION  04/17/2015   Procedure: ESOPHAGEAL DILATION;  Surgeon: [name], MD;  [facility];  Service: Endoscopy;;  . ESOPHAGOGASTRODUODENOSCOPY  08/23/2008   OHY:WVPX distal esophageal erosions consistent with mild erosive reflux esophagitis, otherwise unremarkable esophagus/ Tiny antral erosions of doubtful clinical significance, otherwise normal stomach, patent pylorus, normal D1 and D2  . ESOPHAGOGASTRODUODENOSCOPY  08/06/11   small hh, noncritical Schatzki's ring s/p 75 F  . ESOPHAGOGASTRODUODENOSCOPY N/A 04/17/2015   TGG:YIRSWN s/p dilation  . ESOPHAGOGASTRODUODENOSCOPY (EGD) WITH PROPOFOL N/A 01/20/2018   Dr. [name]: Erosive gastropathy, normal-appearing esophagus status post empiric dilation.  Chronic gastritis, no H. pylori.  . ESOPHAGOSCOPY  03/15/2012   Procedure: ESOPHAGOSCOPY;  Surgeon: [name], MD;  Location: [facility];  Service: ENT;  Laterality: N/A;  . KNEE ARTHROSCOPY  04/27/2001  . MALONEY DILATION N/A 01/20/2018   Procedure: Venia Minks DILATION;  Surgeon: [name], MD;  [facility];  Service: Endoscopy;  Laterality: N/A;  . [place]  2003   negative bruce protocol exercise tress test; EF 68%; intermediate risk study due to evidence of anterior wall ischemia extending from mid-ventricle to apex  . PITUITARY SURGERY  06/2012   benign tumor[facility]  . POLYPECTOMY  12/28/2019   Procedure: POLYPECTOMY;  Surgeon: [name], MD;  [facility];  Service: Endoscopy;;  . RECTOCELE REPAIR    . TRANSTHORACIC ECHOCARDIOGRAM  2003   EF normal  . VAGINAL PROLAPSE REPAIR    . VIDEO BRONCHOSCOPY Bilateral 03/08/2017   Procedure: VIDEO BRONCHOSCOPY WITHOUT FLUORO;  Surgeon: Javier Glazier, MD;  Location: [place];  Service: Cardiopulmonary;  Laterality: Bilateral;   Current  Outpatient Medications on File Prior to Visit  Medication Sig Dispense Refill  . ACCU-CHEK AVIVA PLUS test strip USE AS DIRECTED 100 each 12  . acetaminophen (TYLENOL) 500 MG tablet Take 1,000 mg by mouth 2 (two) times daily.     Marland Kitchen albuterol (VENTOLIN HFA) 108 (90 Base) MCG/ACT inhaler Inhale 1-2 puffs into the lungs every 6 (six) hours as needed for wheezing or shortness of breath. 18 g 0  . aspirin EC 81 MG tablet Take 81 mg by mouth at bedtime.    Marland Kitchen atorvastatin (LIPITOR) 40 MG tablet Take 40 mg by mouth daily.    Marland Kitchen azelastine (ASTELIN) 0.1 % nasal spray 1-2 sprays per nostril 2 times daily as needed (Patient taking differently: Place 1 spray into both nostrils 2 (two) times daily.) 30 mL 12  . azithromycin (ZITHROMAX) 250 MG tablet Take 1 tablet (250 mg total) by mouth daily. Take first 2 tablets together, then 1 every day until finished. 6 tablet 0  . benazepril (LOTENSIN) 20 MG tablet Take by mouth.    Marland Kitchen CALCIUM CARBONATE ANTACID PO Take 2 tablets by mouth See admin instructions. Take 2 tablets by mouth every morning, may also take 2 tablets at night as needed for acid reflux    . celecoxib (CELEBREX) 100 MG capsule TAKE 1 CAPSULE (100 MG TOTAL) BY MOUTH 2 (TWO) TIMES DAILY. [place]  capsule 6  . chlorhexidine (PERIDEX) 0.12 % solution SMARTSIG:By Mouth    . CINNAMON PO Take 1 capsule by mouth daily.    Marland Kitchen conjugated estrogens (PREMARIN) vaginal cream Place 1 Applicatorful vaginally daily as needed (vaginal dryness/itching).    . [employer] MAGNESIUM OXIDE 250 MG TABS Take 1 tablet by mouth daily.    Marland Kitchen DEXILANT 60 MG capsule Take 1 capsule by mouth daily.    . diclofenac sodium (VOLTAREN) 1 % GEL Apply 2-4 g topically 4 (four) times daily as needed (pain).    . Diclofenac Sodium 3 % GEL SMARTSIG:1-2 Gram(s) Topical 3-4 Times Daily    . DULoxetine (CYMBALTA) 30 MG capsule TAKE 1 CAPSULE (30 MG TOTAL) BY MOUTH DAILY. 90 capsule 0  . esomeprazole (NEXIUM) 40 MG capsule TAKE 1 CAPSULE (40 MG TOTAL) BY MOUTH  2 (TWO) TIMES DAILY BEFORE A MEAL. 60 capsule 5  . famotidine (PEPCID) 20 MG tablet TAKE 1 TABLET (20 MG TOTAL) BY MOUTH 2 (TWO) TIMES DAILY AS NEEDED FOR HEARTBURN OR INDIGESTION. 60 tablet 3  . fluconazole (DIFLUCAN) 150 MG tablet Take 150 mg by mouth once.    . fluticasone (FLONASE) 50 MCG/ACT nasal spray Place 1 spray into both nostrils daily. (Patient taking differently: Place 1 spray into both nostrils daily as needed for allergies or rhinitis.) 16 g 1  . fluticasone (FLOVENT HFA) 220 MCG/ACT inhaler INHALE 2 INHALATIONS TWICE DAILY (Patient taking differently: Inhale 2 puffs into the lungs 2 (two) times daily as needed (shortness of breath/wheezing).) 12 g 12  . furosemide (LASIX) 40 MG tablet TAKE 1/2 TO 1 TABLET BY MOUTH DAILY FOR LOWER EXTREMITY SWELLING (Patient taking differently: Take 20 mg by mouth daily.) 90 tablet 2  . gabapentin (NEURONTIN) 300 MG capsule TAKE 2 CAPSULES (600 MG TOTAL) BY MOUTH 2 (TWO) TIMES DAILY. 60 capsule 0  . glipiZIDE (GLUCOTROL) 10 MG tablet Take by mouth.    Marland Kitchen glucose blood test strip USE AS INSTRUCTED    . glucose blood test strip USE AS INSTRUCTED    . glucose blood test strip USE AS INSTRUCTED 100 strip 12  . hydrALAZINE (APRESOLINE) 50 MG tablet Take 1 tablet (50 mg total) by mouth 2 (two) times daily. 270 tablet 3  . hydrochlorothiazide (HYDRODIURIL) 25 MG tablet Take 1 tablet (25 mg total) by mouth daily. 90 tablet 3  . HYDROcodone bit-homatropine (HYCODAN) 5-1.5 MG/5ML syrup hydrocodone-homatropine 5 mg-1.5 mg/5 mL oral syrup    . hydrOXYzine (ATARAX/VISTARIL) 25 MG tablet Take 25 mg by mouth daily as needed.    Marland Kitchen levothyroxine (SYNTHROID) 88 MCG tablet TAKE 1 TABLET (88 MCG TOTAL) BY MOUTH DAILY. 30 tablet 2  . linaclotide (LINZESS) 290 MCG CAPS capsule TAKE 1 CAPSULE BY MOUTH DAILY BEFORE BREAKFAST. 90 capsule 3  . losartan (COZAAR) 25 MG tablet Take 25 mg by mouth daily.    . metFORMIN (GLUCOPHAGE) 1000 MG tablet Take 1,000 mg by mouth 2 (two)  times daily.    . metFORMIN (GLUCOPHAGE) 500 MG tablet 2 tabs PO Q a.m and 1 tab in evenings 90 tablet 5  . Metoprolol Tartrate 75 MG TABS TAKE 1 TABLET BY MOUTH TWICE DAILY. (Patient taking differently: Take 75 mg by mouth 2 (two) times daily.) 180 tablet 2  . montelukast (SINGULAIR) 10 MG tablet TAKE 1 TABLET BY MOUTH AT BEDTIME. 90 tablet 2  . Multiple Vitamin (MULTIVITAMIN WITH MINERALS) TABS tablet Take 1 tablet by mouth daily.     Marland Kitchen NARCAN 4 MG/0.1ML  LIQD nasal spray kit     . Omega-3 Fatty Acids (FISH OIL) 1000 MG CAPS Take 1,000 mg by mouth at bedtime.     . ondansetron (ZOFRAN) 4 MG tablet Take 4 mg by mouth every 8 (eight) hours as needed for nausea or vomiting.    Marland Kitchen OVER THE COUNTER MEDICATION Take 1 capsule by mouth daily. BEET ROOT    . pantoprazole (PROTONIX) 40 MG tablet Take by mouth.    . penicillin v potassium (VEETID) 500 MG tablet Take 500 mg by mouth 4 (four) times daily.    . pioglitazone (ACTOS) 30 MG tablet Take by mouth.    . pregabalin (LYRICA) 25 MG capsule Take 25 mg by mouth 2 (two) times daily.    . Semaglutide (RYBELSUS) 7 MG TABS Take by mouth.    . simvastatin (ZOCOR) 40 MG tablet Take by mouth.    . spironolactone (ALDACTONE) 25 MG tablet Take 25 mg by mouth daily.    Marland Kitchen tiZANidine (ZANAFLEX) 2 MG tablet Take 2 mg by mouth daily as needed.    Marland Kitchen tiZANidine (ZANAFLEX) 4 MG tablet Take 4 mg by mouth 2 (two) times daily as needed for muscle spasms.    . TRULICITY 4.69 GE/9.5MW SOPN Inject 0.75 mg into the skin once a week. 9 mL 3  . valACYclovir (VALTREX) 500 MG tablet Take 500 mg by mouth 2 (two) times daily.    . vitamin B-12 (CYANOCOBALAMIN) 1000 MCG tablet Take 1,000 mcg by mouth daily.    . Vitamin D, Ergocalciferol, (DRISDOL) 1.25 MG (50000 UNIT) CAPS capsule Take 50,000 Units by mouth once a week.    . [DISCONTINUED] amitriptyline (ELAVIL) 50 MG tablet TAKE 1 TABLET BY MOUTH AT BEDTIME. 90 tablet 0  . [DISCONTINUED] buPROPion (WELLBUTRIN XL) 300 MG 24 hr  tablet TAKE 1 TABLET (300 MG TOTAL) BY MOUTH EVERY MORNING. (Patient taking differently: Take 300 mg by mouth daily.) 90 tablet 2   No current facility-administered medications on file prior to visit.    Allergies  Allergen Reactions  . Celexa [Citalopram Hydrobromide] Other (See Comments)    "Made me feel out of it"  . Gabapentin Other (See Comments)    Contributed to lower extremity edema  . Norvasc [Amlodipine Besylate] Other (See Comments)    Edema in lower extremities  . Diflucan [Fluconazole] Rash   Social History   Occupational History    Employer: UNEMPLOYED  Tobacco Use  . Smoking status: Former Smoker    Packs/day: 0.50    Years: 10.00    Pack years: 5.00    Types: Cigarettes    Start date: 07/14/1975    Quit date: 04/04/1993    Years since quitting: 27.8  . Smokeless tobacco: Never Used  . Tobacco comment: quit 20 yrs ago  Vaping Use  . Vaping Use: Never used  Substance and Sexual Activity  . Alcohol use: No    Alcohol/week: 0.0 standard drinks  . Drug use: No  . Sexual activity: Yes   Family History  Problem Relation Age of Onset  . Kidney cancer Father   . Hypertension Father   . Hyperlipidemia Father   . Sleep apnea Father   . Diabetes Mother   . Hypertension Mother   . Heart Problems Mother   . Hyperlipidemia Mother   . Asthma Mother   . Sleep apnea Mother   . Liver disease Brother   . Arthritis Brother   . Hypertension Sister   . Allergic rhinitis Sister   .  Stomach cancer Other        aunt  . Breast cancer Maternal Grandmother   . Kidney disease Maternal Grandfather   . Breast cancer Paternal Grandmother   . Hypertension Brother   . Hyperlipidemia Brother   . Hypertension Brother   . Hypertension Sister   . Hyperlipidemia Sister   . Lupus Maternal Aunt   . Eczema Daughter   . Colon cancer Neg Hx   . Immunodeficiency Neg Hx   . Urticaria Neg Hx    Immunization History  Administered Date(s) Administered  . Influenza Inj Mdck Quad With  Preservative 06/05/2018  . Influenza,inj,Quad PF,6+ Mos 08/05/2016, 07/30/2017, 06/09/2018, 07/28/2019  . Influenza-Unspecified 07/25/2013, 09/04/2014, 07/05/2020  . PFIZER(Purple Top)SARS-COV-2 Vaccination 01/11/2020, 02/01/2020  . Pneumococcal Polysaccharide-23 08/05/2016  . Tdap 08/05/2016     Review of Systems: Negative except as noted in the HPI.  Objective: There were no vitals filed for this visit.  Robin [name] is a pleasant 65 y.o. female in NAD. AAO X 3.  Vascular Examination: Capillary refill time to digits immediate b/l. Palpable pedal pulses b/l LE. Pedal hair absent. Lower extremity skin temperature gradient within normal limits.  Dermatological Examination: Pedal skin with normal turgor, texture and tone bilaterally. No open wounds bilaterally. No interdigital macerations bilaterally. Toenails 1-5 b/l elongated, discolored, dystrophic, thickened, crumbly with subungual debris and tenderness to dorsal palpation.  Musculoskeletal Examination: Normal muscle strength 5/5 to all lower extremity muscle groups bilaterally. No pain crepitus or joint limitation noted with ROM b/l. No gross bony deformities bilaterally.  Footwear Assessment: Does the patient wear appropriate shoes? Yes. Does the patient need inserts/orthotics? No.  Neurological Examination: Pt has subjective symptoms of neuropathy. Protective sensation intact 5/5 intact bilaterally with 10g monofilament b/l. Vibratory sensation intact b/l.  Hemoglobin A1C Latest Ref Rng & Units 12/02/2020  HGBA1C 0.0 - 7.0 % 6.7  Some recent data might be hidden   Assessment: 1. Pain due to onychomycosis of toenails of both feet   2. Diabetic peripheral neuropathy associated with type 2 diabetes mellitus ([place])   3. Encounter for diabetic foot exam ([place])      ADA Risk Categorization: Low Risk :  Patient has all of the following: Intact protective sensation No prior foot ulcer  No severe deformity Pedal pulses  present  Plan: -Examined patient. -Diabetic foot examination performed on today's visit. -Patient to continue soft, supportive shoe gear daily. -Toenails 1-5 b/l were debrided in length and girth with sterile nail nippers and dremel without iatrogenic bleeding.  -Patient to report any pedal injuries to medical professional immediately. -Patient/POA to call should there be question/concern in the interim.  Return in about 3 months (around 05/15/2021).  Marzetta Board, DPM

## 2021-02-13 ENCOUNTER — Encounter: Payer: Self-pay | Admitting: Orthopaedic Surgery

## 2021-02-13 ENCOUNTER — Ambulatory Visit (INDEPENDENT_AMBULATORY_CARE_PROVIDER_SITE_OTHER): Payer: Medicaid Other | Admitting: Orthopaedic Surgery

## 2021-02-13 DIAGNOSIS — M1712 Unilateral primary osteoarthritis, left knee: Secondary | ICD-10-CM

## 2021-02-13 MED ORDER — LIDOCAINE HCL 1 % IJ SOLN
2.0000 mL | INTRAMUSCULAR | Status: AC | PRN
  Administered 2021-02-13: 2 mL

## 2021-02-13 MED ORDER — TRAMADOL HCL 50 MG PO TABS: 50.0000 mg | ORAL_TABLET | Freq: Three times a day (TID) | ORAL | 2 refills | Status: DC | PRN

## 2021-02-13 MED ORDER — BUPIVACAINE HCL 0.25 % IJ SOLN
2.0000 mL | INTRAMUSCULAR | Status: AC | PRN
  Administered 2021-02-13: 2 mL via INTRA_ARTICULAR

## 2021-02-13 MED ORDER — METHYLPREDNISOLONE ACETATE 40 MG/ML IJ SUSP
40.0000 mg | INTRAMUSCULAR | Status: AC | PRN
  Administered 2021-02-13: 40 mg via INTRA_ARTICULAR

## 2021-02-13 NOTE — Progress Notes (Signed)
Office Visit Note   Patient: Robin [name]           Date of Birth: 20-Jan-1956           MRN: [id number] Visit Date: 02/13/2021              Requested by: Ladell Pier, MD [address] [place],  [place] [address] PCP: Ladell Pier, MD   Assessment & Plan: Visit Diagnoses:  1. Unilateral primary osteoarthritis, left knee     Plan: Impression is advanced degenerative joint disease both knees.  Patient has had mild relief in symptoms following the right knee cortisone injection would like to proceed with left knee cortisone injection today.  We will check on the [facility] approval for viscosupplementation injections as well as the referral to the weight loss clinic.  She will follow-up with [name] as needed or once approved.  Call with concerns or questions in the meantime.  Follow-Up Instructions: Return if symptoms worsen or fail to improve.   Orders:  Orders Placed This Encounter  Procedures  . Large Joint Inj: L knee   No orders of the defined types were placed in this encounter.     Procedures: Large Joint Inj: L knee on 02/13/2021 10:58 AM Indications: pain Details: 22 G needle, anterolateral approach Medications: 2 mL lidocaine 1 %; 2 mL bupivacaine 0.25 %; 40 mg methylPREDNISolone acetate 40 MG/ML      Clinical Data: No additional findings.   Subjective: No chief complaint on file.   HPI patient is a pleasant 65 year old female who comes in today for left knee cortisone injection.  I recently saw her for both knees.  History of advanced degenerative joint disease with underlying diabetes and a BMI of 50.94.  We are only able to proceed with right knee cortisone injection due to the diabetes.  She is here today for left knee cortisone injection.  She did fill out [employer] paperwork to try and get a with viscosupplementation injection she has not heard back from this.  She was also referred to the weight loss clinic and has not heard from  them either.     Objective: Vital Signs: There were no vitals taken for this visit.    Ortho Exam stable bilateral knee exams  Specialty Comments:  No specialty comments available.  Imaging: No new imaging   PMFS History: Patient Active Problem List   Diagnosis Date Noted  . Adjustment disorder with mixed disturbance of emotions and conduct 07/28/2020  . Polyp of descending colon 02/15/2020  . Pituitary tumor 08/21/2019  . Primary osteoarthritis of both hips 08/21/2019  . Primary osteoarthritis of both knees 08/21/2019  . Pain in left shoulder 08/21/2019  . Morbid obesity with BMI of 60.0-69.9, adult ([place]) 07/17/2019  . Pedal edema 07/17/2019  . Multilevel degenerative disc disease 10/31/2018  . Abdominal pain 10/28/2018  . Change in bowel function 10/28/2018  . Severe episode of recurrent major depressive disorder, with psychotic features ([place]) 05/26/2018  . Gastritis and gastroduodenitis 04/26/2018  . Atrophic vaginitis 04/01/2018  . Diabetes mellitus ([place]) 03/31/2018  . Cough, persistent 02/08/2018  . Dysphagia 12/14/2017  . 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 Urinary incontinence 07/23/2017  . Early satiety 06/16/2017  . Hx of iron deficiency anemia 05/28/2017  . Throat pain in adult 04/05/2017  . Prediabetes 02/04/2017  . Dyspnea on exertion 12/23/2016  . 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 Hemoptysis 12/23/2016  .  Fibromyalgia 08/18/2016  . Chronic lymphocytic thyroiditis 08/18/2016  . Depression 04/01/2016  . OSA (obstructive sleep apnea) 04/01/2016  . Essential hypertension 12/09/2015  . Hypothyroidism 12/09/2015  . Morbid obesity due to excess calories ([place]) 12/09/2015  . Constipation 05/27/2015  . Fatty liver 05/27/2015  . Abdominal pain, chronic, epigastric 07/04/2012  . Anxiety 06/09/2012  . History of gastroesophageal reflux (GERD) 06/09/2012  . Hyperlipidemia 06/09/2012  . Refusal of blood  transfusions as patient is Jehovah's Witness 06/09/2012  . 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 Abnormal small bowel biopsy 09/18/2011  . Lactose intolerance 09/18/2011  . GERD 01/21/2010   Past Medical History:  Diagnosis Date  . Anxiety disorder   . Arthritis   . Asthma   . Chronic neck pain   . Constipation   . Depression   . Diabetes mellitus without complication ([place])   . Dyspnea   . GERD (gastroesophageal reflux disease)   . Hiatal hernia    small  . Hyperlipidemia   . Hypertension   . Hypothyroidism   . Morbid obesity with BMI of 50.0-59.9, adult ([place])   . Schatzki's ring    non critical  . Sinusitis   . Sleep apnea    CPAP, Sleep study at [place]  . Sleep apnea     Family History  Problem Relation Age of Onset  . Kidney cancer Father   . Hypertension Father   . Hyperlipidemia Father   . Sleep apnea Father   . Diabetes Mother   . Hypertension Mother   . Heart Problems Mother   . Hyperlipidemia Mother   . Asthma Mother   . Sleep apnea Mother   . Liver disease Brother   . Arthritis Brother   . Hypertension Sister   . Allergic rhinitis Sister   . Stomach cancer Other        aunt  . Breast cancer Maternal Grandmother   . Kidney disease Maternal Grandfather   . Breast cancer Paternal Grandmother   . Hypertension Brother   . Hyperlipidemia Brother   . Hypertension Brother   . Hypertension Sister   . Hyperlipidemia Sister   . Lupus Maternal Aunt   . Eczema Daughter   . Colon cancer Neg Hx   . Immunodeficiency Neg Hx   . Urticaria Neg Hx     Past Surgical History:  Procedure Laterality Date  . ABDOMINAL HYSTERECTOMY    . ABDOMINAL HYSTERECTOMY  02/18/2000  . CARDIAC CATHETERIZATION  08/18/2003   normal L main/LAD/L Cfx/RCA (Dr. Adora Fridge)  . COLONOSCOPY  2006   Dr. [name], hyperplastic polyps  . COLONOSCOPY  08/06/11   abnormal terminal ileum for 10cm, erosions, geographical ulceration. Bx small bowel mucosa with  prominent intramucosal lymphoid aggregates, slightly inflammed  . COLONOSCOPY WITH PROPOFOL N/A 12/28/2019   Rourk: 4 sessile serrated adenomas removed on the colon.  Next colonoscopy in 3 years.  Marland Kitchen DIRECT LARYNGOSCOPY  03/15/2012   Procedure: DIRECT LARYNGOSCOPY;  Surgeon: [name], MD;  Location: [facility];  Service: ENT;  Laterality: N/A; Dr. Wolicki--:>no foreign body seen. normal esophagus to 40cm  . ESOPHAGEAL DILATION  04/17/2015   Procedure: ESOPHAGEAL DILATION;  Surgeon: [name], MD;  [facility];  Service: Endoscopy;;  . ESOPHAGOGASTRODUODENOSCOPY  08/23/2008   JJH:ERDE distal esophageal erosions consistent with mild erosive reflux esophagitis, otherwise unremarkable esophagus/ Tiny antral erosions of doubtful clinical significance, otherwise normal stomach, patent pylorus, normal D1 and D2  . ESOPHAGOGASTRODUODENOSCOPY  08/06/11   small hh, noncritical Schatzki's ring s/p 38 F  . ESOPHAGOGASTRODUODENOSCOPY N/A 04/17/2015   HTD:SKAJGO s/p dilation  . ESOPHAGOGASTRODUODENOSCOPY (EGD) WITH PROPOFOL N/A 01/20/2018   Dr. [name]: Erosive gastropathy, normal-appearing esophagus status post empiric dilation.  Chronic gastritis, no H. pylori.  . ESOPHAGOSCOPY  03/15/2012   Procedure: ESOPHAGOSCOPY;  Surgeon: [name], MD;  Location: [facility];  Service: ENT;  Laterality: N/A;  . KNEE ARTHROSCOPY  04/27/2001  . MALONEY DILATION N/A 01/20/2018   Procedure: Venia Minks DILATION;  Surgeon: [name], MD;  [facility];  Service: Endoscopy;  Laterality: N/A;  . [place]  2003   negative bruce protocol exercise tress test; EF 68%; intermediate risk study due to evidence of anterior wall ischemia extending from mid-ventricle to apex  . PITUITARY SURGERY  06/2012   benign tumor, surgeon at [facility]  . POLYPECTOMY  12/28/2019   Procedure: POLYPECTOMY;  Surgeon: [name], MD;  [facility];  Service: Endoscopy;;  . RECTOCELE REPAIR    .  TRANSTHORACIC ECHOCARDIOGRAM  2003   EF normal  . VAGINAL PROLAPSE REPAIR    . VIDEO BRONCHOSCOPY Bilateral 03/08/2017   Procedure: VIDEO BRONCHOSCOPY WITHOUT FLUORO;  Surgeon: Javier Glazier, MD;  Location: [facility];  Service: Cardiopulmonary;  Laterality: Bilateral;   Social History   Occupational History    Employer: UNEMPLOYED  Tobacco Use  . Smoking status: Former Smoker    Packs/day: 0.50    Years: 10.00    Pack years: 5.00    Types: Cigarettes    Start date: 07/14/1975    Quit date: 04/04/1993    Years since quitting: 27.8  . Smokeless tobacco: Never Used  . Tobacco comment: quit 20 yrs ago  Vaping Use  . Vaping Use: Never used  Substance and Sexual Activity  . Alcohol use: No    Alcohol/week: 0.0 standard drinks  . Drug use: No  . Sexual activity: Yes

## 2021-02-14 ENCOUNTER — Ambulatory Visit: Payer: Medicaid Other | Admitting: Physical Therapy

## 2021-02-14 ENCOUNTER — Other Ambulatory Visit: Payer: Self-pay

## 2021-02-14 ENCOUNTER — Encounter: Payer: Self-pay | Admitting: Physical Therapy

## 2021-02-14 DIAGNOSIS — M6281 Muscle weakness (generalized): Secondary | ICD-10-CM | POA: Diagnosis not present

## 2021-02-14 DIAGNOSIS — R262 Difficulty in walking, not elsewhere classified: Secondary | ICD-10-CM

## 2021-02-14 DIAGNOSIS — M25652 Stiffness of left hip, not elsewhere classified: Secondary | ICD-10-CM

## 2021-02-14 DIAGNOSIS — M25651 Stiffness of right hip, not elsewhere classified: Secondary | ICD-10-CM

## 2021-02-14 NOTE — Therapy (Signed)
[facility] [address] [place], [place], [address] Phone: [phone]   Fax:  [phone]  Physical Therapy Treatment  Patient Details  Name: Robin [name] MRN: [id number] Date of Birth: [birth year] Referring Provider (PT): Karle Plumber, MD   Encounter Date: 02/14/2021   PT End of Session - 02/14/21 1006    Visit Number 8    Date for PT Re-Evaluation 04/11/21    Authorization Type Medicaid: 12 visits 01/27/21 - 04/20/21    Authorization Time Period 12 visits, auth time period 01/27/21 - 04/20/21    Authorization - Visit Number 8    Authorization - Number of Visits 18    PT Start Time 0932    PT Stop Time 1015    PT Time Calculation (min) 43 min    Activity Tolerance Patient limited by pain;Patient limited by fatigue;Other (comment)   increased SOB today, needed inhaler 2x          Past Medical History:  Diagnosis Date  . Anxiety disorder   . Arthritis   . Asthma   . Chronic neck pain   . Constipation   . Depression   . Diabetes mellitus without complication ([place])   . Dyspnea   . GERD (gastroesophageal reflux disease)   . Hiatal hernia    small  . Hyperlipidemia   . Hypertension   . Hypothyroidism   . Morbid obesity with BMI of 50.0-59.9, adult ([place])   . Schatzki's ring    non critical  . Sinusitis   . Sleep apnea    CPAP, Sleep study at [place]  . Sleep apnea     Past Surgical History:  Procedure Laterality Date  . ABDOMINAL HYSTERECTOMY    . ABDOMINAL HYSTERECTOMY  02/18/2000  . CARDIAC CATHETERIZATION  08/18/2003   normal L main/LAD/L Cfx/RCA (Dr. Adora Fridge)  . COLONOSCOPY  2006   Dr. [name], hyperplastic polyps  . COLONOSCOPY  08/06/11   abnormal terminal ileum for 10cm, erosions, geographical ulceration. Bx small bowel mucosa with prominent intramucosal lymphoid aggregates, slightly inflammed  . COLONOSCOPY WITH PROPOFOL N/A 12/28/2019   Rourk: 4 sessile serrated  adenomas removed on the colon.  Next colonoscopy in 3 years.  Marland Kitchen DIRECT LARYNGOSCOPY  03/15/2012   Procedure: DIRECT LARYNGOSCOPY;  Surgeon: [name], MD;  Location: [facility];  Service: ENT;  Laterality: N/A; Dr. Wolicki--:>no foreign body seen. normal esophagus to 40cm  . ESOPHAGEAL DILATION  04/17/2015   Procedure: ESOPHAGEAL DILATION;  Surgeon: [name], MD;  [facility];  Service: Endoscopy;;  . ESOPHAGOGASTRODUODENOSCOPY  08/23/2008   MWU:XLKG distal esophageal erosions consistent with mild erosive reflux esophagitis, otherwise unremarkable esophagus/ Tiny antral erosions of doubtful clinical significance, otherwise normal stomach, patent pylorus, normal D1 and D2  . ESOPHAGOGASTRODUODENOSCOPY  08/06/11   small hh, noncritical Schatzki's ring s/p 23 F  . ESOPHAGOGASTRODUODENOSCOPY N/A 04/17/2015   MWN:UUVOZD s/p dilation  . ESOPHAGOGASTRODUODENOSCOPY (EGD) WITH PROPOFOL N/A 01/20/2018   Dr. [name]: Erosive gastropathy, normal-appearing esophagus status post empiric dilation.  Chronic gastritis, no H. pylori.  . ESOPHAGOSCOPY  03/15/2012   Procedure: ESOPHAGOSCOPY;  Surgeon: [name], MD;  Location: [facility];  Service: ENT;  Laterality: N/A;  . KNEE ARTHROSCOPY  04/27/2001  . MALONEY DILATION N/A 01/20/2018   Procedure: Venia Minks DILATION;  Surgeon: [name], MD;  [facility];  Service: Endoscopy;  Laterality: N/A;  . [place] WALL MOTION  2003  negative bruce protocol exercise tress test; EF 68%; intermediate risk study due to evidence of anterior wall ischemia extending from mid-ventricle to apex  . PITUITARY SURGERY  06/2012   benign tumor, surgeon at [facility]  . POLYPECTOMY  12/28/2019   Procedure: POLYPECTOMY;  Surgeon: [name], MD;  [facility];  Service: Endoscopy;;  . RECTOCELE REPAIR    . TRANSTHORACIC ECHOCARDIOGRAM  2003   EF normal  . VAGINAL PROLAPSE REPAIR    . VIDEO BRONCHOSCOPY Bilateral 03/08/2017   Procedure: VIDEO  BRONCHOSCOPY WITHOUT FLUORO;  Surgeon: Javier Glazier, MD;  Location: [facility];  Service: Cardiopulmonary;  Laterality: Bilateral;    There were no vitals filed for this visit.   Subjective Assessment - 02/14/21 0934    Subjective I have had knees injected since last here.  Lt one was yesterday.  Can't tell if that has helped yet.  Rt knee is still achey despite injection 3 weeks ago.  Overall I feel like I'm moving about 70% better with PT.    Pertinent History use of motorized W/C due to weakness and LE pain.    Limitations Standing;Walking    How long can you stand comfortably? 5 min max    How long can you walk comfortably? 20-25 steps with walker    Patient Stated Goals get stronger, walk more, stand longer    Currently in Pain? Yes    Pain Score 7     Pain Location Knee    Pain Orientation Right;Left    Pain Descriptors / Indicators Sore;Tightness    Pain Type Chronic pain    Pain Onset More than a month ago    Aggravating Factors  weightbearing    Pain Relieving Factors med, NWB              [facility] PT Assessment - 02/14/21 0001      Assessment   Medical Diagnosis physical deconditioning    Referring Provider (PT) Karle Plumber, MD    Onset Date/Surgical Date --   2002   Next MD Visit 11/29/20    Prior Therapy none      Posture/Postural Control   Postural Limitations Forward head;Rounded Shoulders;Flexed trunk   Vc for the most erect posture she can   Posture Comments bil knee flexion in standing      Flexibility   Soft Tissue Assessment /Muscle Length yes    Hamstrings lacks end range ext by 7 deg Lt, 10 deg Rt    Quadriceps hip flexors lacking 20 deg to neutral in standing      Ambulation/Gait   Ambulation/Gait Yes    Ambulation/Gait Assistance 5: Supervision    Assistive device Rolling walker                         [facility] Adult PT Treatment/Exercise - 02/14/21 0001      Ambulation/Gait   Pre-Gait Activities standing weight shifts,  quad sets in standing, stand tall for hip flexor elongation 3x10 sec    Gait Comments 50 feet x 1, 55 feet x 1   needed inhaler, PT monitored vitals which were stable and WNL     Knee/Hip Exercises: Stretches   Active Hamstring Stretch Left;Right;2 reps;30 seconds    Active Hamstring Stretch Limitations quad set to actively engage knee ext stretch, seated edge of chair      Knee/Hip Exercises: Aerobic   Nustep L1 x 5', decreased time due to increased SOB today  Knee/Hip Exercises: Seated   Long Arc Quad Strengthening;Both;2 sets;10 reps    Marching Limitations 30 reps seated on foam pad                    PT Short Term Goals - 01/08/21 0946      PT SHORT TERM GOAL #1   Title independent with initial HEP    Status Achieved      PT SHORT TERM GOAL #2   Title walk with rolling walker x 30 feet and demonstrate moderate shortness of breath    Baseline Pt reported mod SOB, did use inhaler    Status Achieved      PT SHORT TERM GOAL #3   Title initiate a walking program at home to improve ambulation and begin to wean from wheelchair use at home    Baseline every other day walking within home using walker    Status Achieved             PT Long Term Goals - 02/14/21 0936      PT LONG TERM GOAL #1   Title Pt will be independent with progression of final HEP    Baseline compliant, working on increased standing tolerance and hip flexor stretching in standing    Status On-going      PT LONG TERM GOAL #2   Title improve strength to wean from wheelchair use > or = to 30% of the time for home distances    Baseline getting out of the W/C 2x/day, still using W/C close to 90% of the time, standing more for kitchen and bathroom tasks    Status On-going      PT LONG TERM GOAL #3   Title improve LE flexibility to stand erect with standing x 1 minute    Status On-going      PT LONG TERM GOAL #4   Title improve strength and endurance to walk 50-75 feet with rolling walker  with moderate shortness of breath    Baseline walks 50' with RW    Status Achieved      PT LONG TERM GOAL #5   Title perform standing exercise for 3-5 minutes in the clinic to improve endurance and independence with meal prep and self care tasks    Baseline 3-4'    Status On-going                 Plan - 02/14/21 1008    Clinical Impression Statement Pt has met all STGs and is making progress toward LTGs.  She has complex medical history contributing to slow progress.  She has had bil knee injections over the past 3 weeks with min improvement in pain.  Her gait distance with RW has improved to 50' and Pt has been able to perform trials up to 4 times within session x 50'.  Today Pt was more limited by SOB so only did 2 trials with need for inhaler use after each trial.  PT monitored O2 sat and Pt was quick to recover and WNL for vitals.  She is getting out of her W/C at least 2 times daily with ind transfers using UE support and is up to standing x 3-4 min tolerance for kitchen and bathroom tasks, limited by bil knee pain and fatigue.  PT encouraged her to try standing hip flexor stretch in stagger stance with tall stand within walker to improve upright posture.  She continues to have flexibility limitations in bil hip flexors and  hamstrings due to use of W/C at home nearly 90% of the day.  She ambulates with flexed trunk and knees but continues to improve gait distance.  She will continue to benefit from skilled PT to imrpove endurance, strength and functional mobility.    Comorbidities obesity, W/C x 1 year, fibromyalgia, depression    Examination-Activity Limitations Stand;Stairs;Squat;Locomotion Level;Transfers;Dressing;Hygiene/Grooming    Examination-Participation Restrictions Cleaning;Community Activity;Meal Prep    Stability/Clinical Decision Making Evolving/Moderate complexity    Rehab Potential Good    PT Frequency 2x / week    PT Duration 8 weeks    PT Treatment/Interventions  ADLs/Self Care Home Management;Cryotherapy;Moist Heat;Neuromuscular re-education;Therapeutic exercise;Therapeutic activities;Functional mobility training;Stair training;Gait training;Patient/family education;Wheelchair mobility training;Manual techniques;Energy conservation;Passive range of motion;Taping;Aquatic Therapy    PT Next Visit Plan continue standing tolerance with erect posture, weight shifting, standing ther ex as tol to reach 5', gait endurance, LE flexibility for hip flexors and hamstrings    PT Home Exercise Plan Access Code: [email or web address]    Consulted and Agree with Plan of Care Patient           Patient will benefit from skilled therapeutic intervention in order to improve the following deficits and impairments:  Abnormal gait,Decreased activity tolerance,Decreased balance,Decreased strength,Postural dysfunction,Impaired flexibility,Hypomobility,Pain,Decreased endurance,Difficulty walking,Decreased range of motion  Visit Diagnosis: Muscle weakness (generalized) - Plan: PT plan of care cert/re-cert  Difficulty in walking, not elsewhere classified - Plan: PT plan of care cert/re-cert  Stiffness of left hip, not elsewhere classified - Plan: PT plan of care cert/re-cert  Stiffness of right hip, not elsewhere classified - Plan: PT plan of care cert/re-cert     Problem List Patient Active Problem List   Diagnosis Date Noted  . Adjustment disorder with mixed disturbance of emotions and conduct 07/28/2020  . Polyp of descending colon 02/15/2020  . Pituitary tumor 08/21/2019  . Primary osteoarthritis of both hips 08/21/2019  . Primary osteoarthritis of both knees 08/21/2019  . Pain in left shoulder 08/21/2019  . Morbid obesity with BMI of 60.0-69.9, adult ([place]) 07/17/2019  . Pedal edema 07/17/2019  . Multilevel degenerative disc disease 10/31/2018  . Abdominal pain 10/28/2018  . Change in bowel function 10/28/2018  . Severe episode of recurrent major depressive disorder, with  psychotic features ([place]) 05/26/2018  . Gastritis and gastroduodenitis 04/26/2018  . Atrophic vaginitis 04/01/2018  . Diabetes mellitus ([place]) 03/31/2018  . Cough, persistent 02/08/2018  . Dysphagia 12/14/2017  . 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 Urinary incontinence 07/23/2017  . Early satiety 06/16/2017  . Hx of iron deficiency anemia 05/28/2017  . Throat pain in adult 04/05/2017  . Prediabetes 02/04/2017  . Dyspnea on exertion 12/23/2016  . 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 Hemoptysis 12/23/2016  . Fibromyalgia 08/18/2016  . Chronic lymphocytic thyroiditis 08/18/2016  . Depression 04/01/2016  . OSA (obstructive sleep apnea) 04/01/2016  . Essential hypertension 12/09/2015  . Hypothyroidism 12/09/2015  . Morbid obesity due to excess calories ([place]) 12/09/2015  . Constipation 05/27/2015  . Fatty liver 05/27/2015  . Abdominal pain, chronic, epigastric 07/04/2012  . Anxiety 06/09/2012  . History of gastroesophageal reflux (GERD) 06/09/2012  . Hyperlipidemia 06/09/2012  . Refusal of blood transfusions as patient is Jehovah's Witness 06/09/2012  . 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 Abnormal small bowel biopsy 09/18/2011  . Lactose intolerance 09/18/2011  . GERD 01/21/2010   Baruch Merl, PT 02/14/21 12:05 PM   [facility] [address], [place], [place], [address]  Phone: [phone]   Fax:  [phone]  Name: TAYEN NARANG MRN: [id number] Date of Birth: 1956/07/10

## 2021-02-18 ENCOUNTER — Other Ambulatory Visit: Payer: Self-pay | Admitting: *Deleted

## 2021-02-18 ENCOUNTER — Other Ambulatory Visit: Payer: Self-pay

## 2021-02-18 NOTE — Patient Outreach (Signed)
Care Coordination  02/18/2021  Robin [name] 1955/10/17 [id number]  Successful outreach with [name] today, however she recently had some teeth extracted and did not feel like talking. Rescheduled for 02/25/21 @ 11:30am. Patient agreed to date and time.   [name] RN, BSN [place]  [employer] manager

## 2021-02-21 ENCOUNTER — Ambulatory Visit: Payer: Medicaid Other | Admitting: Physical Therapy

## 2021-02-24 ENCOUNTER — Other Ambulatory Visit: Payer: Self-pay

## 2021-02-24 ENCOUNTER — Other Ambulatory Visit: Payer: Self-pay | Admitting: Internal Medicine

## 2021-02-24 NOTE — Patient Outreach (Signed)
Sent staff msg to Dr. Wynetta Emery requesting Losartan refill. CC'd her on chart from last month on 02/20/21.

## 2021-02-25 ENCOUNTER — Other Ambulatory Visit: Payer: Self-pay | Admitting: *Deleted

## 2021-02-25 ENCOUNTER — Other Ambulatory Visit: Payer: Self-pay | Admitting: Internal Medicine

## 2021-02-25 ENCOUNTER — Other Ambulatory Visit: Payer: Self-pay

## 2021-02-25 MED ORDER — LOSARTAN POTASSIUM 25 MG PO TABS: 25.0000 mg | ORAL_TABLET | Freq: Every day | ORAL | 1 refills | Status: DC

## 2021-02-25 NOTE — Patient Outreach (Addendum)
Medicaid Managed Care   Nurse Care Manager Note  02/25/2021 Name:  Robin [name] MRN:  [id number] DOB:  01-20-1956  Robin [name] is an 65 y.o. year old female who is a primary patient of Robin Pier, MD.  The Trainer team was consulted for assistance with:    DMII, BMI  Robin [name] was given information about Medicaid Managed Care Coordination team services today. Robin [name] agreed to services and verbal consent obtained.  Engaged with patient by telephone for follow up visit in response to provider referral for case management and/or care coordination services.   Assessments/Interventions:  Review of past medical history, allergies, medications, health status, including review of consultants reports, laboratory and other test data, was performed as part of comprehensive evaluation and provision of chronic care management services.  SDOH (Social Determinants of Health) assessments and interventions performed:   Robin [name] did not review medications today as MM Pharmacist recently reviewed all medications     Patient Active Problem List   Diagnosis Date Noted  . Adjustment disorder with mixed disturbance of emotions and conduct 07/28/2020  . Polyp of descending colon 02/15/2020  . Pituitary tumor 08/21/2019  . Primary osteoarthritis of both hips 08/21/2019  . Primary osteoarthritis of both knees 08/21/2019  . Pain in left shoulder 08/21/2019  . Morbid obesity with BMI of 60.0-69.9, adult ([place]) 07/17/2019  . Pedal edema 07/17/2019  . Multilevel degenerative disc disease 10/31/2018  . Abdominal pain 10/28/2018  . Change in bowel function 10/28/2018  . Severe episode of recurrent major depressive disorder, with psychotic features ([place]) 05/26/2018  . Gastritis and gastroduodenitis 04/26/2018  . Atrophic vaginitis 04/01/2018  . Diabetes mellitus ([place]) 03/31/2018  . Cough, persistent 02/08/2018  . Dysphagia 12/14/2017  . Generalized OA  07/30/2017  . Urinary incontinence 07/23/2017  . Early satiety 06/16/2017  . Hx of iron deficiency anemia 05/28/2017  . Throat pain in adult 04/05/2017  . Prediabetes 02/04/2017  . Dyspnea on exertion 12/23/2016  . 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 Hemoptysis 12/23/2016  . Fibromyalgia 08/18/2016  . Chronic lymphocytic thyroiditis 08/18/2016  . Depression 04/01/2016  . OSA (obstructive sleep apnea) 04/01/2016  . Essential hypertension 12/09/2015  . Hypothyroidism 12/09/2015  . Morbid obesity due to excess calories ([place]) 12/09/2015  . Constipation 05/27/2015  . Fatty liver 05/27/2015  . Abdominal pain, chronic, epigastric 07/04/2012  . Anxiety 06/09/2012  . History of gastroesophageal reflux (GERD) 06/09/2012  . Hyperlipidemia 06/09/2012  . Refusal of blood transfusions as patient is Jehovah's Witness 06/09/2012  . 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 Abnormal small bowel biopsy 09/18/2011  . Lactose intolerance 09/18/2011  . GERD 01/21/2010    Conditions to be addressed/monitored per PCP order:  DMII and BMI  Care Plan : Weight loss to Improve Health  Updates made by Robin Montane, RN since 02/25/2021 12:00 AM    Problem: Weight Management     Long-Range Goal: Achieving Weight Loss Completed 02/25/2021  Start Date: 08/22/2020  Expected End Date: 02/25/2021  This Visit's Progress: On track  Recent Progress: On track  Priority: High  Note:    Current Barriers:  Marland Kitchen Knowledge Deficits related to Weight loss. Robin [name] reports that she needs to work on weight loss. She is needing knee surgery, but needs to lose weight before having the procedure.  Recent weight was 346#. Robin [name] would like to lose 50#, eventually getting down to  230#. Patient is checking BS twice daily, readings have been high x 3 weeks(180-240). She reports taking her medication and cutting back on carbohydrates. She is interested in taking a  diabetic education class. She started physical therapy since last visit and is excited about her first session of aquatic therapy tomorrow. Update-Robin [name] recently had dental work, had teeth extractions and is eating a soft diet. She needs to call and schedule a follow up with her dental provider. Reports improving blood sugar readings, 114 fasting this morning. Continues to have PT. Robin [name] recently had a steroid injection in her right knee, now reports increased pain in her right hip and plans to call her provider. . Unable to independently develop a plan for weight loss . Robin [name] is requesting a bedside commode, wheelchair and CPAP to better manage her health at home-She has received the wheelchair and bedside commode. Still waiting on CPAP-new order placed by Dr. Leighton Roach CPAP last Thursday 01/09/21 and is using it every night Nurse Case Manager Clinical Goal(s):  . patient will verbalize understanding of plan for weight management-Met-Robin [name] reports eating most meals at home, prepared by her husband. They have cut out all potatoes, rice and pasta, eating whole wheat bread and increased vegetables. She has been unable to exercise due to knee pain and is interested in Aquatic Therapy. Robin [name] will begin Aquatic Therapy on 11/14/20 . patient will continue to meet with RN Care Manager to address ways to achieve weight loss . patient will work with CM team pharmacist to review medications-Met Interventions:  . Inter-disciplinary care team collaboration (see longitudinal plan of care) . Encouraged patient to increase protein in her diet and discussed foods like sugar free greek yogurt and soft scrambled eggs . Discussed plans with patient for ongoing care management follow up and provided patient with direct contact information for care management team . RNCM explained to patient the importance of managing diabetes and how it can help with weight loss . Discussed exercises that she can do while  sitting and encouraged her to exercise daily . Reviewed upcoming appointments including:5/26 with PCP, and 5/27 with PT . Encouragement provided to continue working toward goal  Patient Goals/Self-Care Activities - increase protien in your diet with sugar free greek yogurt, soft scrambled eggs, smoothies made with sugar free greek yogurt - increase exercise to daily - check blood sugar at prescribed times - take the blood sugar log to all doctor visits - take the blood sugar meter to all doctor visits  - Self administers medications as prescribed - Attends all scheduled provider appointments - Calls provider office for new concerns or questions - change to whole grain breads, cereal, pasta - drink 6 to 8 glasses of water each day - eat 3 to 5 servings of fruits and vegetables each day - eat 5 or 6 small meals each day - limit fast food meals to no more than 1 per week - manage portion size - Eat three small meals a day instead of 2 larger meals - Cut out regular soda-switch to diet soda and increase water intake - keep a food diary - reduce red meat to 2 to 3 times a week  Follow Up Plan: no follow up        Follow Up:  Patient agrees to Care Plan and Follow-up.  Plan: The patient has been provided with contact information for the Managed Medicaid care management team and has been advised to call with any health related  questions or concerns.  Date/time of next scheduled RN care management/care coordination outreach: no follow up  [name] RN, [place] RN Care Coordinator

## 2021-02-25 NOTE — Patient Instructions (Signed)
Visit Information  Ms. Sacco was given information about Medicaid Managed Care team care coordination services as a part of their Healthy [facility] Medicaid benefit. [name] verbally consented to engagement with the [facility] Managed Care team.   For questions related to your Healthy [facility] health plan, please call: [phone] or visit the homepage here: [email or web address]  If you would like to schedule transportation through your Healthy [facility] plan, please call the following number at least 2 days in advance of your appointment: [phone]   Call the [place] at [phone], at any time, 24 hours a day, 7 days a week. If you are in danger or need immediate medical attention call 911.  Ms. Desantiago - following are the goals we discussed in your visit today:  Goals Addressed            This Visit's Progress   . Monitor and Manage My Blood Sugar-Diabetes Type 2       Timeframe:  Long-Range Goal Priority:  High Start Date:  08/22/21                           Expected End Date: 03/27/21                  Follow Up Date 03/27/21   - increase protien in your diet with sugar free greek yogurt, soft scrambled eggs, smoothies made with sugar free greek yogurt - increase exercise to daily - check blood sugar at prescribed times - take the blood sugar log to all doctor visits - take the blood sugar meter to all doctor visits  - Self administers medications as prescribed - Attends all scheduled provider appointments - Calls provider office for new concerns or questions - change to whole grain breads, cereal, pasta - drink 6 to 8 glasses of water each day - eat 3 to 5 servings of fruits and vegetables each day - eat 5 or 6 small meals each day - limit fast food meals to no more than 1 per week - manage portion size - Eat three small meals a day instead of 2 larger meals - Cut out regular soda-switch to diet soda and  increase water intake - keep a food diary - reduce red meat to 2 to 3 times a week   Why is this important?    Checking your blood sugar at home helps to keep it from getting very high or very low.   Writing the results in a diary or log helps the doctor know how to care for you.   Your blood sugar log should have the time, date and the results.   Also, write down the amount of insulin or other medicine that you take.   Other information, like what you ate, exercise done and how you were feeling, will also be helpful.            Please see education materials related to exercise benefits provided by MyChart link.    Exercise Information for Aging Adults Staying physically active is important as you age. The four types of exercises that are best for older adults are endurance, strength, balance, and flexibility. Contact your health care provider before you start any exercise routine. Ask your health care provider what activities are safe for you. What are the risks? Risks associated with exercising include:  Overdoing it. This may lead to sore muscles or fatigue.  Falls.  Injuries.  Dehydration. How to do these exercises Endurance exercises Endurance (aerobic) exercises raise your breathing rate and heart rate. Increasing your endurance helps you to do everyday tasks and stay healthy. By improving the health of your body system that includes your heart, lungs, and blood vessels (circulatory system), you may also delay or prevent diseases such as heart disease, diabetes, and bone loss (osteoporosis). Types of endurance exercises include:  Sports.  Indoor activities, such as using gym equipment, doing water aerobics, or dancing.  Outdoor activities, such as biking or jogging.  Tasks around the house, such as gardening, yard work, and heavy household chores like cleaning.  Walking, such as hiking or walking around your neighborhood. When doing endurance exercises, make  sure you:  Are aware of your surroundings.  Use safety equipment as directed.  Dress in layers when exercising outdoors.  Drink plenty of water to stay well hydrated. Build up endurance slowly. Start with 10 minutes at a time, and gradually build up to doing 30 minutes at a time. Unless your health care provider gave you different instructions, aim to exercise for a total of 150 minutes a week. Spread out that time so you are working on endurance on 3 or more days a week.   Strength exercises Lifting, pulling, or pushing weights helps to strengthen muscles. Having stronger muscles makes it easier to do everyday activities, such as getting up from a chair, climbing stairs, carrying groceries, and playing with grandchildren. Strength exercises include arm and leg exercises that may be done:  With weights.  Without weights (using your own body weight).  With a resistance band. When doing strength exercises:  Move smoothly and steadily. Do not suddenly thrust or jerk the weights, the resistance band, or your body.  Start with no weights or with light weights, and gradually add more weight over time. Eventually, aim to use weights that are hard or very hard for you to lift. This means that you are able to do 8 repetitions with the weight, and the last few repetitions are very challenging.  Lift or push weights into position for 3 seconds, hold the position for 1 second, and then take 3 seconds to return to your starting position.  Breathe out (exhale) during difficult movements, like lifting or pushing weights. Breathe in (inhale) to relax your muscles before the next repetition.  Consider alternating arms or legs, especially when you first start strength exercises.  Expect some slight muscle soreness after each session. Do strength exercises on 2 or more days a week, for 30 minutes at a time. Avoid exercising the same muscle groups two days in a row. For example, if you work on your leg  muscles one day, work on your arm muscles the next day. When you can do two sets of 10-15 repetitions with a certain weight, increase the amount of weight.   Balance Balance exercises can help to prevent falls. Balance exercises include:  Standing on one foot.  Heel-to-toe walk.  Balance walk.  Tai chi. Make sure you have something sturdy to hold onto while doing balance exercises, such as a sturdy chair. As your balance improves, challenge yourself by holding onto the chair with one hand instead of two, and then with no hands. Trying exercises with your eyes closed also challenges your balance, but be sure to have a sturdy surface (like a countertop) close by in case you need it. Do balance exercises as often as you want, or as often as directed by your  health care provider. Strength exercises for the lower body also help to improve balance. Flexibility Flexibility exercises improve how far you can bend, straighten, move, or rotate parts of your body (range of motion). These exercises also help you to do everyday activities such as getting dressed or reaching for objects. Flexibility exercises include stretching different parts of the body, and they may be done in a standing or seated position or on the floor. When stretching, make sure you:  Keep a slight bend in your arms and legs. Avoid completely straightening ("locking") your joints.  Do not stretch so far that you feel pain. You should feel a mild stretching feeling. You may try stretching farther as you become more flexible over time.  Relax and breathe between stretches.  Hold onto something sturdy for balance as needed. Hold each stretch for 10-30 seconds. Repeat each stretch 3-5 times.   General safety tips  Exercise in well-lit areas.  Do not hold your breath during exercises or stretches.  Warm up before exercising, and cool down after exercising. This can help prevent injury.  Drink plenty of water during exercise or  any activity that makes you sweat.  Use smooth, steady movements. Do not use sudden, jerking movements, especially when lifting weights or doing flexibility exercises.  If you are not sure if an exercise is safe for you, or you are not sure how to do an exercise, talk with your health care provider. This is especially important if you have had surgery on muscles, bones, or joints (orthopedic surgery). Where to find more information You can find more information about exercise for older adults from:  Your local health department, fitness center, or community center. These facilities may have programs for aging adults.  [employer] on Aging: http://kim-miller.com/  National Council on Aging: www.ncoa.org Summary  Staying physically active is important as you age.  Make sure to contact your health care provider before you start any exercise routine. Ask your health care provider what activities are safe for you.  Doing endurance, strength, balance, and flexibility exercises can help to delay or prevent certain diseases, such as heart disease, diabetes, and bone loss (osteoporosis). This information is not intended to replace advice given to you by your health care provider. Make sure you discuss any questions you have with your health care provider. Document Revised: 01/10/2020 Document Reviewed: 01/10/2020 Elsevier Patient Education  2021 [place].   Patient verbalizes understanding of instructions provided today.   No further follow up required: careplan completed today  [name] RN, BSN [place] RN Care Coordinator   Following is a copy of your plan of care:  Patient Care Plan: Social Work - Depression (Adult)    Problem Identified: Depression Identification (Depression)     Goal: Psychiatry and Outpatient Therapy for Depressive Symptoms Completed 10/15/2020  Start Date: 08/28/2020  Expected End Date: 11/04/2020  This Visit's Progress: On  track  Recent Progress: On track  Priority: High  Note:    Current Barriers:  . 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 . Transportation - Due to husband's treatment, he is not always available to transport patient to her appointments.  Marland Kitchen Update 08/28/2020: Transportation issues still create barriers for patient. She is aware that she can contact her Medicaid plan, [employer], for transportation assistance. Marland Kitchen Update 09/25/2020: Patient was unable to keep previously scheduled appointment on 11/91/4782 due to conflict with husband's appointments. . Mental Health  Concerns  . Patient's husband has cancer and is currently receiving treatment at the [place] in [place]. . Suicidal Ideation/Homicidal Ideation: No  Clinical Social Work Goal(s):  Marland Kitchen Over the next 60 days, patient will work with SW bi-weekly by telephone or in person to reduce or manage symptoms related to depression. . Over the next 30 days, patient will attend all scheduled mental health appointments: as scheduled. Patient will arrange transportation needs in the appropriate time frame to ensure she has transportation for scheduled appointments.  . Over the next 30 days, patient will demonstrate improved health management independence as evidenced by symptom management.   Interventions: . Patient interviewed and appropriate assessments performed: PHQ 2 and PHQ 9 . Patient interviewed and appropriate assessments performed . Provided patient with information about depression and physical illness. . Advised patient to discuss outpatient therapy with psychiatrist. Patient is scheduled for psychiatry at [facility] on 08/15/2020. LCSW encouraged patient to discuss therapy needs with the psychiatrist during this appointment so that she can receive both psychiatry and outpatient therapy at the same agency.  Marland Kitchen Update 08/28/2020: Patient stated she had to cancel the appointment due to  transportation issues and reschedule for today, 08/28/2020 @ 2:30pm. She stated she would like to discuss therapy needs at that time.  Marland Kitchen Update 09/25/2020: Patient was unable to keep previously scheduled appointment on 02/54/2706 due to conflict with husband's appointments. Appointment was rescheduled to Monday, 09/30/2020. Marland Kitchen Emotional/Supportive Counseling . Update 09/25/2020: LCSW will follow up on October 15, 2020 @ 2:00pm. . Update 10/15/2020: Patient confirmed psychiatric appointment attendance at [facility]. She stated the psychiatrist is scheduling her with a therapist at the same agency.  Patient Self Care Activities:  . Self administers medications as prescribed . Attends church or other social activities . Ability for insight . Motivation for treatment . Strong family or social support  Patient Coping Strengths:  . Supportive Relationships . Family . Friends . Church . [employer] . Spirituality . Hopefulness  Patient Self Care Deficits:  . Unable to independently get around without assistance due to arthritis all over her body. . Unable to perform ADLs independently . Unable to perform IADLs independently . Husband has cancer. . Transportation issues due to husband receiving treatment for cancer.  Please see past updates related to this goal by clicking on the "Past Updates" button in the selected goal    Patient Care Plan: Weight loss to Improve Health    Problem Identified: Weight Management     Long-Range Goal: Achieving Weight Loss   Start Date: 08/22/2020  Expected End Date: 03/27/2021  This Visit's Progress: On track  Recent Progress: On track  Priority: High  Note:    Current Barriers:  Marland Kitchen Knowledge Deficits related to Weight loss. Ms. Tamm reports that she needs to work on weight loss. She is needing knee surgery, but needs to lose weight before having the procedure.  Recent weight was 346#. Ms. Dunlop would like to lose 50#, eventually getting down to  230#. Patient is checking BS twice daily, readings have been high x 3 weeks(180-240). She reports taking her medication and cutting back on carbohydrates. She is interested in taking a diabetic education class. She started physical therapy since last visit and is excited about her first session of aquatic therapy tomorrow. Update-Ms. Burbano recently had dental work, had teeth extractions and is eating a soft diet. She needs to call and schedule a follow up with her dental provider. Reports improving blood sugar readings, 114 fasting  this morning. Continues to have PT. Ms. Gielow recently had a steroid injection in her right knee, now reports increased pain in her right hip and plans to call her provider. . Unable to independently develop a plan for weight loss . Ms Dambrosia is requesting a bedside commode, wheelchair and CPAP to better manage her health at home-She has received the wheelchair and bedside commode. Still waiting on CPAP-new order placed by Dr. Leighton Roach CPAP last Thursday 01/09/21 and is using it every night Nurse Case Manager Clinical Goal(s):  . patient will verbalize understanding of plan for weight management-Met-Ms Shumard reports eating most meals at home, prepared by her husband. They have cut out all potatoes, rice and pasta, eating whole wheat bread and increased vegetables. She has been unable to exercise due to knee pain and is interested in Aquatic Therapy. Ms Ferrero will begin Aquatic Therapy on 11/14/20 . patient will continue to meet with RN Care Manager to address ways to achieve weight loss . patient will work with CM team pharmacist to review medications-Met Interventions:  . Inter-disciplinary care team collaboration (see longitudinal plan of care) . Encouraged patient to increase protein in her diet and discussed foods like sugar free greek yogurt and soft scrambled eggs . Discussed plans with patient for ongoing care management follow up and provided patient with direct contact  information for care management team . RNCM explained to patient the importance of managing diabetes and how it can help with weight loss . Discussed exercises that she can do while sitting and encouraged her to exercise daily . Reviewed upcoming appointments including:5/26 with PCP, and 5/27 with PT . Encouragement provided to continue working toward goal  Patient Goals/Self-Care Activities - increase protien in your diet with sugar free greek yogurt, soft scrambled eggs, smoothies made with sugar free greek yogurt - increase exercise to daily - check blood sugar at prescribed times - take the blood sugar log to all doctor visits - take the blood sugar meter to all doctor visits  - Self administers medications as prescribed - Attends all scheduled provider appointments - Calls provider office for new concerns or questions - change to whole grain breads, cereal, pasta - drink 6 to 8 glasses of water each day - eat 3 to 5 servings of fruits and vegetables each day - eat 5 or 6 small meals each day - limit fast food meals to no more than 1 per week - manage portion size - Eat three small meals a day instead of 2 larger meals - Cut out regular soda-switch to diet soda and increase water intake - keep a food diary - reduce red meat to 2 to 3 times a week  Follow Up Plan: The Managed Medicaid care management team will reach out to the patient again over the next 30 days on 03/27/21 @ 1pm     Patient Care Plan: Medication Management    Problem Identified: Medication Management   Note:   Current Barriers:  . Unable to independently afford treatment regimen . Unable to self administer medications as prescribed . Does not adhere to prescribed medication regimen . Does not maintain contact with provider office . Does not contact provider office for questions/concerns .   Pharmacist Clinical Goal(s):  Marland Kitchen Over the next 30 days, patient will contact provider office for questions/concerns  as evidenced notation of same in electronic health record through collaboration with PharmD and provider.  .   Interventions: . Inter-disciplinary care team collaboration (see longitudinal plan  of care) . Comprehensive medication review performed; medication list updated in electronic medical record  @RXCPDIABETES @ @RXCPHYPERTENSION @ @RXCPHYPERLIPIDEMIA @ @RXCPASTHMA @ @RXCPMENTALHEALTH @  Patient Goals/Self-Care Activities . Over the next 30 days, patient will:  - take medications as prescribed collaborate with provider on medication access solutions  Follow Up Plan: The care management team will reach out to the patient again over the next 30 days.     Goal: Self-Management Plan Developed   Note:   Evidence-based guidance:   Review biopsychosocial determinants of health screens.   Determine level of modifiable health risk.   Assess level of patient activation, level of readiness, importance and confidence to make changes.   Evoke change talk using open-ended questions, pros and cons, as well as looking forward.   Identify areas where behavior change may lead to improved health.   Partner with patient to develop a robust self-management plan that includes lifestyle factors, such as weight loss, exercise and healthy nutrition, as well as goals specific to disease risks.   Support patient and family/caregiver active participation in decision-making and self-management plan.   Implement additional goals and interventions based on identified risk factors to reduce health risk.   Facilitate advance care planning.   Review need for preventive screening based on age, sex, family history and health history.   Notes:

## 2021-02-25 NOTE — Patient Outreach (Signed)
Spoke with Dr. Wynetta Emery regarding Losartan refill, she wanted to verify patient not also on Benazapril. Called previous Pharmacy and Lynnell Grain said hasn't been filled and she doesn't see it (Went back to November). Should be OK to fill and let Dr. Wynetta Emery know

## 2021-02-25 NOTE — Patient Outreach (Signed)
Care Coordination  02/25/2021  Robin [name] Oct 17, 1955 [id number]  RNCM called [name] to explain to her that with her insurance change to [place], I will no longer be her Case Freight forwarder. Explained to patient should she have future health management needs to discuss this with her PCP or medicaid case Freight forwarder. [name] voiced understanding. Encouraged her to continue to exercise and eat a healthy diet.  [name] RN, BSN [place]  [employer] manager

## 2021-02-27 ENCOUNTER — Other Ambulatory Visit: Payer: Self-pay

## 2021-02-27 ENCOUNTER — Ambulatory Visit: Payer: Medicaid Other | Attending: Internal Medicine | Admitting: Internal Medicine

## 2021-02-27 ENCOUNTER — Encounter: Payer: Self-pay | Admitting: Internal Medicine

## 2021-02-27 DIAGNOSIS — M1990 Unspecified osteoarthritis, unspecified site: Secondary | ICD-10-CM | POA: Insufficient documentation

## 2021-02-27 DIAGNOSIS — Z79899 Other long term (current) drug therapy: Secondary | ICD-10-CM

## 2021-02-27 DIAGNOSIS — Z8249 Family history of ischemic heart disease and other diseases of the circulatory system: Secondary | ICD-10-CM | POA: Insufficient documentation

## 2021-02-27 DIAGNOSIS — M158 Other polyosteoarthritis: Secondary | ICD-10-CM

## 2021-02-27 DIAGNOSIS — I110 Hypertensive heart disease with heart failure: Secondary | ICD-10-CM | POA: Insufficient documentation

## 2021-02-27 DIAGNOSIS — E039 Hypothyroidism, unspecified: Secondary | ICD-10-CM | POA: Diagnosis not present

## 2021-02-27 DIAGNOSIS — I152 Hypertension secondary to endocrine disorders: Secondary | ICD-10-CM

## 2021-02-27 DIAGNOSIS — Z87891 Personal history of nicotine dependence: Secondary | ICD-10-CM | POA: Insufficient documentation

## 2021-02-27 DIAGNOSIS — Z23 Encounter for immunization: Secondary | ICD-10-CM | POA: Diagnosis not present

## 2021-02-27 DIAGNOSIS — F331 Major depressive disorder, recurrent, moderate: Secondary | ICD-10-CM | POA: Diagnosis not present

## 2021-02-27 DIAGNOSIS — E1169 Type 2 diabetes mellitus with other specified complication: Secondary | ICD-10-CM | POA: Diagnosis not present

## 2021-02-27 DIAGNOSIS — E1142 Type 2 diabetes mellitus with diabetic polyneuropathy: Secondary | ICD-10-CM | POA: Diagnosis not present

## 2021-02-27 DIAGNOSIS — E1159 Type 2 diabetes mellitus with other circulatory complications: Secondary | ICD-10-CM | POA: Diagnosis not present

## 2021-02-27 DIAGNOSIS — G4733 Obstructive sleep apnea (adult) (pediatric): Secondary | ICD-10-CM | POA: Diagnosis not present

## 2021-02-27 LAB — GLUCOSE, POCT (MANUAL RESULT ENTRY): POC Glucose: 112 mg/dl — AB (ref 70–99)

## 2021-02-27 MED ORDER — GABAPENTIN 600 MG PO TABS: 600.0000 mg | ORAL_TABLET | Freq: Three times a day (TID) | ORAL | 2 refills | Status: DC

## 2021-02-27 NOTE — Progress Notes (Signed)
Patient ID: Robin [name], female    DOB: 1956/03/07  MRN: [id number]  CC: Diabetes and Hypertension   Subjective: Robin [name] is a 65 y.o. female who presents for chronic ds management Her concerns today include:  Patient with history ofHTN, DM,OSA, fibromyalgia, depression, obesity, GERD, hypothyroid, OA of multiple js (hands, knees, shoulders, hips),pituitary adenoma status post resection.  Patient did not bring medicines with her.   DM/morbid obesity: She has lost 25 pounds in the past 1 month.  She is on Trulicity.  It has decreased her appetite.  She also had extraction of 12 teeth 1-1/2 weeks ago.  Not able to eat as much as her gums are still sore and healing.  Blood sugars have been good.  Checking before breakfast and at bedtime.  Range before breakfast 104-117; at bedtime 108-119. -Did medical weight management through our practice in [facility] last year or the year before and achieve minimal weight loss.  She has been referred to [facility] health medical weight management and has appointment coming up next month. -Has a lot of burning in the legs and feet.  She is supposed to be on gabapentin 300 mg twice a day.  However she has been taking 600 mg twice a day and still finds it bothersome.  States that the plan was to try to get Lyrica for her but was declined by her insurance.  OA multiple joints: Saw Dr. Roda Shutters recently and had injections to both knees.  She needs to have knee replacement but he is wanting her to get her weight down.  Plans to try help with viscosupplement but waiting on approval from her insurance.  Right hip has been bothering her a lot more recently.  OSA: She did get CPAP machine and has been trying to use it.  However she gets a lot of air leakage around the mask and wakes up in the mornings with her mouth and nose very dry.  Hypothyroidism: She is wanting to know when was the last time her thyroid level was checked.  I see recent TSH in the system done  last month and that was normal.  Reports compliance with levothyroxine.  Depression: She sees a therapist through [facility].  She no longer has a psychiatrist.  Her therapist has been trying to find a psychiatrist for her as all the ones at [facility] are not accepting new patients.  Reports compliance with Cymbalta  Patient does not have medications with her today.  I note she has some duplication of medicines on her list and she is not sure of some of them.  For instance she has Rybelsus and Trulicity listed.  Protonix, Dexilant and Nexium, Zocor and Lipitor  HM: Due for eye exam, COVID booster and shingles vaccine.  Patient Active Problem List   Diagnosis Date Noted  . Adjustment disorder with mixed disturbance of emotions and conduct 07/28/2020  . Polyp of descending colon 02/15/2020  . Pituitary tumor 08/21/2019  . Primary osteoarthritis of both hips 08/21/2019  . Primary osteoarthritis of both knees 08/21/2019  . Pain in left shoulder 08/21/2019  . Morbid obesity with BMI of 60.0-69.9, adult (HCC) 07/17/2019  . Pedal edema 07/17/2019  . Multilevel degenerative disc disease 10/31/2018  . Abdominal pain 10/28/2018  . Change in bowel function 10/28/2018  . Severe episode of recurrent major depressive disorder, with psychotic features (HCC) 05/26/2018  . Gastritis and gastroduodenitis 04/26/2018  . Atrophic vaginitis 04/01/2018  . Diabetes mellitus (HCC) 03/31/2018  .  Cough, persistent 02/08/2018  . Dysphagia 12/14/2017  . 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 Urinary incontinence 07/23/2017  . Early satiety 06/16/2017  . Hx of iron deficiency anemia 05/28/2017  . Throat pain in adult 04/05/2017  . Prediabetes 02/04/2017  . Dyspnea on exertion 12/23/2016  . 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 Hemoptysis 12/23/2016  . Fibromyalgia 08/18/2016  . Chronic lymphocytic thyroiditis 08/18/2016  . Depression 04/01/2016  . OSA (obstructive sleep  apnea) 04/01/2016  . Essential hypertension 12/09/2015  . Hypothyroidism 12/09/2015  . Morbid obesity due to excess calories ([place]) 12/09/2015  . Constipation 05/27/2015  . Fatty liver 05/27/2015  . Abdominal pain, chronic, epigastric 07/04/2012  . Anxiety 06/09/2012  . History of gastroesophageal reflux (GERD) 06/09/2012  . Hyperlipidemia 06/09/2012  . Refusal of blood transfusions as patient is Jehovah's Witness 06/09/2012  . 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 Abnormal small bowel biopsy 09/18/2011  . Lactose intolerance 09/18/2011  . GERD 01/21/2010     Current Outpatient Medications on File Prior to Visit  Medication Sig Dispense Refill  . ACCU-CHEK AVIVA PLUS test strip USE AS DIRECTED 100 each 12  . acetaminophen (TYLENOL) 500 MG tablet Take 1,000 mg by mouth 2 (two) times daily.     Marland Kitchen albuterol (VENTOLIN HFA) 108 (90 Base) MCG/ACT inhaler Inhale 1-2 puffs into the lungs every 6 (six) hours as needed for wheezing or shortness of breath. 18 g 0  . aspirin EC 81 MG tablet Take 81 mg by mouth at bedtime.    Marland Kitchen atorvastatin (LIPITOR) 40 MG tablet Take 40 mg by mouth daily.    Marland Kitchen azelastine (ASTELIN) 0.1 % nasal spray 1-2 sprays per nostril 2 times daily as needed (Patient taking differently: Place 1 spray into both nostrils 2 (two) times daily.) 30 mL 12  . azithromycin (ZITHROMAX) 250 MG tablet Take 1 tablet (250 mg total) by mouth daily. Take first 2 tablets together, then 1 every day until finished. 6 tablet 0  . CALCIUM CARBONATE ANTACID PO Take 2 tablets by mouth See admin instructions. Take 2 tablets by mouth every morning, may also take 2 tablets at night as needed for acid reflux    . celecoxib (CELEBREX) 100 MG capsule TAKE 1 CAPSULE (100 MG TOTAL) BY MOUTH 2 (TWO) TIMES DAILY. 60 capsule 6  . chlorhexidine (PERIDEX) 0.12 % solution SMARTSIG:By Mouth    . CINNAMON PO Take 1 capsule by mouth daily.    Marland Kitchen conjugated estrogens (PREMARIN) vaginal cream Place 1 Applicatorful  vaginally daily as needed (vaginal dryness/itching).    . [employer] MAGNESIUM OXIDE 250 MG TABS Take 1 tablet by mouth daily.    Marland Kitchen DEXILANT 60 MG capsule Take 1 capsule by mouth daily.    . diclofenac sodium (VOLTAREN) 1 % GEL Apply 2-4 g topically 4 (four) times daily as needed (pain).    . Diclofenac Sodium 3 % GEL SMARTSIG:1-2 Gram(s) Topical 3-4 Times Daily    . DULoxetine (CYMBALTA) 30 MG capsule TAKE 1 CAPSULE (30 MG TOTAL) BY MOUTH DAILY. 90 capsule 0  . esomeprazole (NEXIUM) 40 MG capsule TAKE 1 CAPSULE (40 MG TOTAL) BY MOUTH 2 (TWO) TIMES DAILY BEFORE A MEAL. 60 capsule 5  . famotidine (PEPCID) 20 MG tablet TAKE 1 TABLET (20 MG TOTAL) BY MOUTH 2 (TWO) TIMES DAILY AS NEEDED FOR HEARTBURN OR INDIGESTION. 60 tablet 3  . fluconazole (DIFLUCAN) 150 MG tablet Take 150 mg by mouth once.    . fluticasone (FLONASE)  50 MCG/ACT nasal spray Place 1 spray into both nostrils daily. (Patient taking differently: Place 1 spray into both nostrils daily as needed for allergies or rhinitis.) 16 g 1  . fluticasone (FLOVENT HFA) 220 MCG/ACT inhaler INHALE 2 INHALATIONS TWICE DAILY (Patient taking differently: Inhale 2 puffs into the lungs 2 (two) times daily as needed (shortness of breath/wheezing).) 12 g 12  . furosemide (LASIX) 40 MG tablet TAKE 1/2 TO 1 TABLET BY MOUTH DAILY FOR LOWER EXTREMITY SWELLING (Patient taking differently: Take 20 mg by mouth daily.) 90 tablet 2  . glipiZIDE (GLUCOTROL) 10 MG tablet Take by mouth.    Marland Kitchen glucose blood test strip USE AS INSTRUCTED    . glucose blood test strip USE AS INSTRUCTED    . glucose blood test strip USE AS INSTRUCTED 100 strip 12  . hydrALAZINE (APRESOLINE) 50 MG tablet Take 1 tablet (50 mg total) by mouth 2 (two) times daily. 270 tablet 3  . hydrochlorothiazide (HYDRODIURIL) 25 MG tablet Take 1 tablet (25 mg total) by mouth daily. 90 tablet 3  . HYDROcodone bit-homatropine (HYCODAN) 5-1.5 MG/5ML syrup hydrocodone-homatropine 5 mg-1.5 mg/5 mL oral syrup    .  hydrOXYzine (ATARAX/VISTARIL) 25 MG tablet Take 25 mg by mouth daily as needed.    Marland Kitchen levothyroxine (SYNTHROID) 88 MCG tablet TAKE 1 TABLET (88 MCG TOTAL) BY MOUTH DAILY. 30 tablet 2  . linaclotide (LINZESS) 290 MCG CAPS capsule TAKE 1 CAPSULE BY MOUTH DAILY BEFORE BREAKFAST. 90 capsule 3  . losartan (COZAAR) 25 MG tablet Take 1 tablet (25 mg total) by mouth daily. 90 tablet 1  . metFORMIN (GLUCOPHAGE) 1000 MG tablet Take 1,000 mg by mouth 2 (two) times daily.    . metFORMIN (GLUCOPHAGE) 500 MG tablet 2 tabs PO Q a.m and 1 tab in evenings 90 tablet 5  . Metoprolol Tartrate 75 MG TABS TAKE 1 TABLET BY MOUTH TWICE DAILY. (Patient taking differently: Take 75 mg by mouth 2 (two) times daily.) 180 tablet 2  . montelukast (SINGULAIR) 10 MG tablet TAKE 1 TABLET BY MOUTH AT BEDTIME. 90 tablet 2  . Multiple Vitamin (MULTIVITAMIN WITH MINERALS) TABS tablet Take 1 tablet by mouth daily.     Marland Kitchen NARCAN 4 MG/0.1ML LIQD nasal spray kit     . Omega-3 Fatty Acids (FISH OIL) 1000 MG CAPS Take 1,000 mg by mouth at bedtime.     . ondansetron (ZOFRAN) 4 MG tablet Take 4 mg by mouth every 8 (eight) hours as needed for nausea or vomiting.    Marland Kitchen OVER THE COUNTER MEDICATION Take 1 capsule by mouth daily. BEET ROOT    . pantoprazole (PROTONIX) 40 MG tablet Take by mouth.    . penicillin v potassium (VEETID) 500 MG tablet Take 500 mg by mouth 4 (four) times daily.    . pioglitazone (ACTOS) 30 MG tablet Take by mouth.    . Semaglutide (RYBELSUS) 7 MG TABS Take by mouth.    . simvastatin (ZOCOR) 40 MG tablet Take by mouth.    . spironolactone (ALDACTONE) 25 MG tablet Take 25 mg by mouth daily.    Marland Kitchen tiZANidine (ZANAFLEX) 2 MG tablet Take 2 mg by mouth daily as needed.    Marland Kitchen tiZANidine (ZANAFLEX) 4 MG tablet Take 4 mg by mouth 2 (two) times daily as needed for muscle spasms.    . traMADol (ULTRAM) 50 MG tablet Take 1 tablet (50 mg total) by mouth 3 (three) times daily as needed. 30 tablet 2  . TRULICITY 1.02 TR/1.7BV  SOPN Inject  0.75 mg into the skin once a week. 9 mL 3  . valACYclovir (VALTREX) 500 MG tablet Take 500 mg by mouth 2 (two) times daily.    . vitamin B-12 (CYANOCOBALAMIN) 1000 MCG tablet Take 1,000 mcg by mouth daily.    . Vitamin D, Ergocalciferol, (DRISDOL) 1.25 MG (50000 UNIT) CAPS capsule Take 50,000 Units by mouth once a week.    . [DISCONTINUED] amitriptyline (ELAVIL) 50 MG tablet TAKE 1 TABLET BY MOUTH AT BEDTIME. 90 tablet 0  . [DISCONTINUED] buPROPion (WELLBUTRIN XL) 300 MG 24 hr tablet TAKE 1 TABLET (300 MG TOTAL) BY MOUTH EVERY MORNING. (Patient taking differently: Take 300 mg by mouth daily.) 90 tablet 2   No current facility-administered medications on file prior to visit.    Allergies  Allergen Reactions  . Celexa [Citalopram Hydrobromide] Other (See Comments)    "Made me feel out of it"  . Gabapentin Other (See Comments)    Contributed to lower extremity edema  . Norvasc [Amlodipine Besylate] Other (See Comments)    Edema in lower extremities  . Diflucan [Fluconazole] Rash    Social History   Socioeconomic History  . Marital status: Married    Spouse name: Not on file  . Number of children: 4  . Years of education: 10  . Highest education level: Not on file  Occupational History    Employer: UNEMPLOYED  Tobacco Use  . Smoking status: Former Smoker    Packs/day: 0.50    Years: 10.00    Pack years: 5.00    Types: Cigarettes    Start date: 07/14/1975    Quit date: 04/04/1993    Years since quitting: 27.9  . Smokeless tobacco: Never Used  . Tobacco comment: quit 20 yrs ago  Vaping Use  . Vaping Use: Never used  Substance and Sexual Activity  . Alcohol use: No    Alcohol/week: 0.0 standard drinks  . Drug use: No  . Sexual activity: Yes  Other Topics Concern  . Not on file  Social History Narrative   Lives with husband in an apartment on the first floor.  Has 4 children.     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  Trying to get disability.   Formerly worked as a Teacher, early years/pre.  Education: 11th grade.      [place] [facility] (12/23/16):   Originally from [facility] [place] [facility]. She was raised in [place]. 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 No pets currently. No bird exposure. She does have mold in her current home in the bathroom, laundry room, & master bedroom.    Social Determinants of Health   Financial Resource Strain: Low Risk   . Difficulty of Paying Living Expenses: Not very hard  Food Insecurity: No Food Insecurity  . Worried About Charity fundraiser in the Last Year: Never true  . Ran Out of Food in the Last Year: Never true  Transportation Needs: Unmet Transportation Needs  . Lack of Transportation (Medical): Yes  . Lack of Transportation (Non-Medical): No  Physical Activity: Inactive  . Days of Exercise per Week: 0 days  . Minutes of Exercise per Session: 0 min  Stress: Stress Concern Present  . Feeling of Stress : To some extent  Social Connections: Socially Integrated  . Frequency of Communication with  Friends and Family: More than three times a week  . Frequency of Social Gatherings with Friends and Family: More than three times a week  . Attends Religious Services: More than 4 times per year  . Active Member of Clubs or Organizations: Yes  . Attends Archivist Meetings: More than 4 times per year  . Marital Status: Married  Human resources officer Violence: Not At Risk  . Fear of Current or Ex-Partner: No  . Emotionally Abused: No  . Physically Abused: No  . Sexually Abused: No    Family History  Problem Relation Age of Onset  . Kidney cancer Father   . Hypertension Father   . Hyperlipidemia Father   . Sleep apnea Father   . Diabetes Mother   . Hypertension Mother   . Heart Problems Mother   . Hyperlipidemia Mother   . Asthma Mother   . Sleep apnea Mother   . Liver disease  Brother   . Arthritis Brother   . Hypertension Sister   . Allergic rhinitis Sister   . Stomach cancer Other        aunt  . Breast cancer Maternal Grandmother   . Kidney disease Maternal Grandfather   . Breast cancer Paternal Grandmother   . Hypertension Brother   . Hyperlipidemia Brother   . Hypertension Brother   . Hypertension Sister   . Hyperlipidemia Sister   . Lupus Maternal Aunt   . Eczema Daughter   . Colon cancer Neg Hx   . Immunodeficiency Neg Hx   . Urticaria Neg Hx     Past Surgical History:  Procedure Laterality Date  . ABDOMINAL HYSTERECTOMY    . ABDOMINAL HYSTERECTOMY  02/18/2000  . CARDIAC CATHETERIZATION  08/18/2003   normal L main/LAD/L Cfx/RCA (Dr. Adora Fridge)  . COLONOSCOPY  2006   Dr. [name], hyperplastic polyps  . COLONOSCOPY  08/06/11   abnormal terminal ileum for 10cm, erosions, geographical ulceration. Bx small bowel mucosa with prominent intramucosal lymphoid aggregates, slightly inflammed  . COLONOSCOPY WITH PROPOFOL N/A 12/28/2019   Rourk: 4 sessile serrated adenomas removed on the colon.  Next colonoscopy in 3 years.  Marland Kitchen DIRECT LARYNGOSCOPY  03/15/2012   Procedure: DIRECT LARYNGOSCOPY;  Surgeon: [name], MD;  Location: [place];  Service: ENT;  Laterality: N/A; Dr. Wolicki--:>no foreign body seen. normal esophagus to 40cm  . ESOPHAGEAL DILATION  04/17/2015   Procedure: ESOPHAGEAL DILATION;  Surgeon: [name], MD;  [facility];  Service: Endoscopy;;  . ESOPHAGOGASTRODUODENOSCOPY  08/23/2008   ZDG:LOVF distal esophageal erosions consistent with mild erosive reflux esophagitis, otherwise unremarkable esophagus/ Tiny antral erosions of doubtful clinical significance, otherwise normal stomach, patent pylorus, normal D1 and D2  . ESOPHAGOGASTRODUODENOSCOPY  08/06/11   small hh, noncritical Schatzki's ring s/p 43 F  . ESOPHAGOGASTRODUODENOSCOPY N/A 04/17/2015   IEP:PIRJJO s/p dilation  . ESOPHAGOGASTRODUODENOSCOPY (EGD) WITH PROPOFOL  N/A 01/20/2018   Dr. [name]: Erosive gastropathy, normal-appearing esophagus status post empiric dilation.  Chronic gastritis, no H. pylori.  . ESOPHAGOSCOPY  03/15/2012   Procedure: ESOPHAGOSCOPY;  Surgeon: [name], MD;  Location: [place];  Service: ENT;  Laterality: N/A;  . KNEE ARTHROSCOPY  04/27/2001  . MALONEY DILATION N/A 01/20/2018   Procedure: Venia Minks DILATION;  Surgeon: [name], MD;  [facility];  Service: Endoscopy;  Laterality: N/A;  . NM MYOCAR PERF WALL MOTION  2003   negative bruce protocol exercise tress test; EF 68%; intermediate risk study due to evidence  of anterior wall ischemia extending from mid-ventricle to apex  . PITUITARY SURGERY  06/2012   benign tumor, [facility] at [facility]   . POLYPECTOMY  12/28/2019   Procedure: POLYPECTOMY;  Surgeon: [name], MD;  [facility];  Service: Endoscopy;;  . RECTOCELE REPAIR    . TRANSTHORACIC ECHOCARDIOGRAM  2003   EF normal  . VAGINAL PROLAPSE REPAIR    . VIDEO BRONCHOSCOPY Bilateral 03/08/2017   Procedure: VIDEO BRONCHOSCOPY WITHOUT FLUORO;  Surgeon: Javier Glazier, MD;  Location: [place];  Service: Cardiopulmonary;  Laterality: Bilateral;    ROS: Review of Systems Negative except as stated above  PHYSICAL EXAM: BP 114/71   Pulse 60   Resp 16   Wt (!) 318 lb (144.2 kg)   SpO2 94%   BMI 48.35 kg/m   Wt Readings from Last 3 Encounters:  02/27/21 (!) 318 lb (144.2 kg)  02/10/21 (!) 335 lb (152 kg)  01/30/21 (!) 343 lb (155.6 kg)    Physical Exam  General appearance - alert, well appearing, morbidly obese female and in no distress.  Patient in wheelchair Mental status - normal mood, behavior, speech, dress, motor activity, and thought processes Neck - supple, no significant adenopathy Chest - clear to auscultation, no wheezes, rales or rhonchi, symmetric air entry Heart - normal rate, regular rhythm, normal S1, S2, no murmurs, rubs, clicks or gallops Extremities - peripheral  pulses normal, no pedal edema, no clubbing or cyanosis   Depression screen [facility] 2/9 02/27/2021 02/10/2021 11/29/2020  Decreased Interest 2 0 3  Down, Depressed, Hopeless 3 1 2   PHQ - 2 Score 5 1 5   Altered sleeping 3 - 2  Tired, decreased energy 2 - 3  Change in appetite 2 - 2  Feeling bad or failure about yourself  - - 2  Trouble concentrating 2 - 1  Moving slowly or fidgety/restless 1 - 3  Suicidal thoughts 0 - 0  PHQ-9 Score 15 - 18  Difficult doing work/chores - - -  Some recent data might be hidden    CMP Latest Ref Rng & Units 01/04/2021 07/27/2020 12/26/2019  Glucose 70 - 99 mg/dL 133(H) 176(H) 165(H)  BUN 8 - 23 mg/dL 10 18 16   Creatinine 0.44 - 1.00 mg/dL 0.74 0.91 0.76  Sodium 135 - 145 mmol/L 137 139 137  Potassium 3.5 - 5.1 mmol/L 3.5 3.6 4.3  Chloride 98 - 111 mmol/L 100 98 101  CO2 22 - 32 mmol/L 29 28 28   Calcium 8.9 - 10.3 mg/dL 9.6 10.4(H) 9.2  Total Protein 6.5 - 8.1 g/dL 7.9 7.8 -  Total Bilirubin 0.3 - 1.2 mg/dL 0.6 1.1 -  Alkaline Phos 38 - 126 U/L 53 71 -  AST 15 - 41 U/L 32 45(H) -  ALT 0 - 44 U/L 35 56(H) -   Lipid Panel     Component Value Date/Time   CHOL 141 10/25/2019 1038   TRIG 58 10/25/2019 1038   HDL 58 10/25/2019 1038   CHOLHDL 2.4 10/25/2019 1038   CHOLHDL 2.3 12/16/2015 0950   VLDL 13 12/16/2015 0950   LDLCALC 71 10/25/2019 1038    CBC    Component Value Date/Time   WBC 12.0 (H) 01/04/2021 1555   RBC 4.34 01/04/2021 1555   HGB 12.8 01/04/2021 1555   HGB 12.0 01/20/2017 1001   HCT 41.0 01/04/2021 1555   HCT 38.2 01/20/2017 1001   HCT 41 06/25/2011 1053   PLT 411 (H) 01/04/2021 1555   PLT  356 01/20/2017 1001   MCV 94.5 01/04/2021 1555   MCV 90 01/20/2017 1001   MCV 91.1 06/25/2011 1053   MCH 29.5 01/04/2021 1555   MCHC 31.2 01/04/2021 1555   RDW 13.0 01/04/2021 1555   RDW 13.1 01/20/2017 1001   LYMPHSABS 4.6 (H) 01/04/2021 1555   LYMPHSABS 3.4 (H) 01/20/2017 1001   MONOABS 1.6 (H) 01/04/2021 1555   EOSABS 0.1 01/04/2021 1555    EOSABS 0.1 01/20/2017 1001   BASOSABS 0.1 01/04/2021 1555   BASOSABS 0.0 01/20/2017 1001    ASSESSMENT AND PLAN: 1. Type 2 diabetes mellitus with morbid obesity ([place]) Commended her on weight loss which I think is due to combination of Trulicity and decreased portions over the past 1-1/2 weeks due to having teeth extractions.  Encouraged her to continue smaller portions.  She will keep the upcoming appointment with medical weight management. - POCT glucose (manual entry) - Ambulatory referral to Ophthalmology  2. Diabetic peripheral neuropathy ([place]) Still very symptomatic.  We discussed increasing the gabapentin to 600 mg 3 times a day. - gabapentin (NEURONTIN) 600 MG tablet; Take 1 tablet (600 mg total) by mouth 3 (three) times daily.  Dispense: 270 tablet; Refill: 2  3. Hypertension associated with diabetes ([place]) At goal.  Continue current medications which should include Cozaar, spironolactone, HCTZ, hydralazine and metoprolol  4. OSA (obstructive sleep apnea) We will send her to the sleep lab to be tried with different masks to see if she can tolerate just the mask that goes over the mouth - Desensitization mask fit; Future  5. Hypothyroidism, unspecified type Recent TSH within normal range.  Continue levothyroxine  6. Other osteoarthritis involving multiple joints Stressed importance of continued weight loss to help get some of the mechanical strain of her joints.  7. Moderate episode of recurrent major depressive disorder ([place]) I will refer her to [facility] behavioral health to see if we can get her in with a psychiatrist there.  Message sent to our LCSW to try to facilitate this - Ambulatory referral to Psychiatry  8. Polypharmacy Patient with polypharmacy and med list that has several of the same class of medications.  I will reach out to [facility] pharmacist Arizona Constable and request that he does a medication reconciliation for [name].  Advised patient to bring all of her  medications with her on subsequent visit  9. Need for shingles vaccine Shingrix given today.  Second vaccine will be given on her follow-up visit in 4 months.    Patient was given the opportunity to ask questions.  Patient verbalized understanding of the plan and was able to repeat key elements of the plan.   Orders Placed This Encounter  Procedures  . Ambulatory referral to Ophthalmology  . Ambulatory referral to Psychiatry  . POCT glucose (manual entry)  . Desensitization mask fit     Requested Prescriptions   Signed Prescriptions Disp Refills  . gabapentin (NEURONTIN) 600 MG tablet 270 tablet 2    Sig: Take 1 tablet (600 mg total) by mouth 3 (three) times daily.    Return in about 4 months (around 06/30/2021).  Karle Plumber, MD, FACP

## 2021-02-27 NOTE — Addendum Note (Signed)
Addended by: Daisy Blossom, Annie Main L on: 02/27/2021 03:29 PM   Modules accepted: Orders

## 2021-02-27 NOTE — Patient Outreach (Signed)
Meds list has grown since last week and has multiple discrepancies. Per my visit with patient last week, this is what I had patient on  Medication Name CPP Transfer (put Dr.'s name) -Timing Last Fill Date & Day Supply Format: MM/DD/YY - DS (If last fill/DS unavailable, list pt.'s quantity on hand) Anticipated next due date  Format: MM/DD/YY      BB  B  L  EM  BT    Test Strips/Lancets  Wynetta Emery, Deborah       HOLD  Albuterol inhaler  Karle Plumber       HOLD  ASA 81mg    Karle Plumber  1     Hold until first packaging please  Atorvastatin 40mg   LAWSON,AMANDA TRAKAS     1  12/11/20 for 90 days 03/11/21  Celecoxib 100mg  VIALS  Karle Plumber      12/16/20 for 30 days 03/11/21  Diclofenac Gel  Karle Plumber       HOLD  Duloxetine 30mg   BIJELAC,MARICA   1    01/03/21 for 30 days 01/31/21  Esomeprazole 40mg   LAWSON,AMANDA TRAKAS   1  1  01/05/21 for 90 days 04/03/21  Fluticasone nose spray         HOLD  Fluticasone/Flovent 220mg  2 BID         HOLD  Furosemide 40mg  VIALS         HOLD Until needed  Hydralazine 50mg  written as TID but taking BID  Karle Plumber  1  1  09/03/21 for 135 days 01/15/21  HCTZ 25mg   Karle Plumber  1    09/23/20 for 90 days 01/15/21  Levothyroxine 49mcg  Karle Plumber 1     3/14/ for 30 days 01/15/21  Linzess 222mcg  BOONE,ANNA W  1    12/16/20 for 30 days 01/15/21  Losartan 25mg   Metformin 100mg  BID  LAWSON,AMANDA TRAKAS LAWSON,AMANDA TRAKAS        1  1    1   2/26 for 90 days  12/12/20 for 90 days 02/28/21  03/25/21  Metoprolol Tart 75mg   Karle Plumber  1  1  2/24 for 90 days 02/26/21  Monteleukast 10mg   Karle Plumber    1  12/16/20 for 90 days  03/11/21  Spironolactone 25mg   LAWSON,AMANDA TRAKAS   1    01/03/21 for 90 days 0/09/23  Trulicity 1/week  LAWSON,AMANDA TRAKAS       12/12/20 for 30 days 01/15/21  Vitamin D 1/week  LAWSON,AMANDA TRAKAS   1    11/05/20 for 90 days 02/03/21

## 2021-02-28 ENCOUNTER — Encounter: Payer: Self-pay | Admitting: Physical Therapy

## 2021-02-28 ENCOUNTER — Ambulatory Visit: Payer: Medicaid Other | Admitting: Physical Therapy

## 2021-02-28 DIAGNOSIS — R262 Difficulty in walking, not elsewhere classified: Secondary | ICD-10-CM

## 2021-02-28 DIAGNOSIS — M25651 Stiffness of right hip, not elsewhere classified: Secondary | ICD-10-CM

## 2021-02-28 DIAGNOSIS — M25652 Stiffness of left hip, not elsewhere classified: Secondary | ICD-10-CM

## 2021-02-28 DIAGNOSIS — M6281 Muscle weakness (generalized): Secondary | ICD-10-CM

## 2021-02-28 NOTE — Therapy (Signed)
[facility] [address], [place], [place], [address] Phone: [phone]   Fax:  [phone]  Physical Therapy Treatment  Patient Details  Name: Robin [name] MRN: [id number] Date of Birth: May 11, 1956 Referring Provider (PT): Karle Plumber, MD   Encounter Date: 02/28/2021   PT End of Session - 02/28/21 1000    Visit Number 9    Date for PT Re-Evaluation 04/11/21    Authorization Type Medicaid: 12 visits 01/27/21 - 04/20/21    Authorization Time Period 12 visits, auth time period 01/27/21 - 04/20/21    Authorization - Visit Number 9    Authorization - Number of Visits 18    PT Start Time 6270   Pt early and PT's Pt before did not show up   PT Stop Time 3500    PT Time Calculation (min) 45 min    Activity Tolerance Patient limited by pain;Patient limited by fatigue;Other (comment)           Past Medical History:  Diagnosis Date  . Anxiety disorder   . Arthritis   . Asthma   . Chronic neck pain   . Constipation   . Depression   . Diabetes mellitus without complication ([place])   . Dyspnea   . GERD (gastroesophageal reflux disease)   . Hiatal hernia    small  . Hyperlipidemia   . Hypertension   . Hypothyroidism   . Morbid obesity with BMI of 50.0-59.9, adult ([place])   . Schatzki's ring    non critical  . Sinusitis   . Sleep apnea    CPAP, Sleep study at [facility]  . Sleep apnea     Past Surgical History:  Procedure Laterality Date  . ABDOMINAL HYSTERECTOMY    . ABDOMINAL HYSTERECTOMY  02/18/2000  . CARDIAC CATHETERIZATION  08/18/2003   normal L main/LAD/L Cfx/RCA (Dr. Adora Fridge)  . COLONOSCOPY  2006   Dr. [name], hyperplastic polyps  . COLONOSCOPY  08/06/11   abnormal terminal ileum for 10cm, erosions, geographical ulceration. Bx small bowel mucosa with prominent intramucosal lymphoid aggregates, slightly inflammed  . COLONOSCOPY WITH PROPOFOL N/A 12/28/2019   Rourk: 4 sessile  serrated adenomas removed on the colon.  Next colonoscopy in 3 years.  Marland Kitchen DIRECT LARYNGOSCOPY  03/15/2012   Procedure: DIRECT LARYNGOSCOPY;  Surgeon: [name], MD;  Location: [facility];  Service: ENT;  Laterality: N/A; Dr. Wolicki--:>no foreign body seen. normal esophagus to 40cm  . ESOPHAGEAL DILATION  04/17/2015   Procedure: ESOPHAGEAL DILATION;  Surgeon: [name], MD;  [facility];  Service: Endoscopy;;  . ESOPHAGOGASTRODUODENOSCOPY  08/23/2008   XFG:HWEX distal esophageal erosions consistent with mild erosive reflux esophagitis, otherwise unremarkable esophagus/ Tiny antral erosions of doubtful clinical significance, otherwise normal stomach, patent pylorus, normal D1 and D2  . ESOPHAGOGASTRODUODENOSCOPY  08/06/11   small hh, noncritical Schatzki's ring s/p 72 F  . ESOPHAGOGASTRODUODENOSCOPY N/A 04/17/2015   HBZ:JIRCVE s/p dilation  . ESOPHAGOGASTRODUODENOSCOPY (EGD) WITH PROPOFOL N/A 01/20/2018   Dr. [name]: Erosive gastropathy, normal-appearing esophagus status post empiric dilation.  Chronic gastritis, no H. pylori.  . ESOPHAGOSCOPY  03/15/2012   Procedure: ESOPHAGOSCOPY;  Surgeon: [name], MD;  Location: [facility];  Service: ENT;  Laterality: N/A;  . KNEE ARTHROSCOPY  04/27/2001  . MALONEY DILATION N/A 01/20/2018   Procedure: Venia Minks DILATION;  Surgeon: [name], MD;  [facility];  Service: Endoscopy;  Laterality: N/A;  . NM MYOCAR PERF  WALL MOTION  2003   negative bruce protocol exercise tress test; EF 68%; intermediate risk study due to evidence of anterior wall ischemia extending from mid-ventricle to apex  . PITUITARY SURGERY  06/2012   benign tumor, surgeon at [facility]  . POLYPECTOMY  12/28/2019   Procedure: POLYPECTOMY;  Surgeon: [name], MD;  [facility];  Service: Endoscopy;;  . RECTOCELE REPAIR    . TRANSTHORACIC ECHOCARDIOGRAM  2003   EF normal  . VAGINAL PROLAPSE REPAIR    . VIDEO BRONCHOSCOPY Bilateral 03/08/2017    Procedure: VIDEO BRONCHOSCOPY WITHOUT FLUORO;  Surgeon: Javier Glazier, MD;  Location: [facility];  Service: Cardiopulmonary;  Laterality: Bilateral;    There were no vitals filed for this visit.   Subjective Assessment - 02/28/21 0954    Subjective I am putting in more efforts to stand and walk at home and it is a struggle sometimes but I do it.  Achey today from the weather.    Pertinent History use of motorized W/C due to weakness and LE pain.    Limitations Standing;Walking    How long can you stand comfortably? 5 min max    How long can you walk comfortably? 20-25 steps with walker    Patient Stated Goals get stronger, walk more, stand longer    Currently in Pain? Yes    Pain Score 7     Pain Location Knee    Pain Orientation Right;Left    Pain Descriptors / Indicators Aching    Pain Type Chronic pain    Pain Onset More than a month ago    Aggravating Factors  weightbearing    Pain Relieving Factors med, NWB                             [facility] Adult PT Treatment/Exercise - 02/28/21 0001      Ambulation/Gait   Ambulation/Gait Yes    Ambulation/Gait Assistance 6: Modified independent (Device/Increase time);5: Supervision    Assistive device Rolling walker    Pre-Gait Activities standing endurance reps 5" and 10" sets with sit to stand    Gait Comments 50', 25' with improved weight shifting to Rt/Lt, slow gait, knees flexed and trunk flexed, most limited by back pain today      Exercises   Exercises Lumbar;Knee/Hip;Shoulder      Lumbar Exercises: Stretches   Other Lumbar Stretch Exercise seated trunk flexion large blue physioball rollouts hands on ball x 10 reps   from chair + black pad   Other Lumbar Stretch Exercise Pt cued erect standing to tolerance with standing endurance with sit to stands      Lumbar Exercises: Aerobic   Nustep L1 x 8' for warm up, 4' end of session      Knee/Hip Exercises: Aerobic   Nustep L1 x 6' PT present to monitor for  tolerance/SOB      Knee/Hip Exercises: Standing   Other Standing Knee Exercises standing endurance with sit to stand reps: 1x5 5" stands/rep, 1x5 10" stands/rep      Knee/Hip Exercises: Seated   Long Arc Quad Strengthening;Both;2 sets;10 reps;Weights    Long Arc Quad Weight 2 lbs.    Marching Strengthening;Both;2 sets;10 reps    Marching Limitations holding 3lb weight on thigh    Sit to [employer] 2 sets;5 reps   stand 5 sec/rep 1st set, stand 10 sec/rep 2nd set  PT Short Term Goals - 01/08/21 0946      PT SHORT TERM GOAL #1   Title independent with initial HEP    Status Achieved      PT SHORT TERM GOAL #2   Title walk with rolling walker x 30 feet and demonstrate moderate shortness of breath    Baseline Pt reported mod SOB, did use inhaler    Status Achieved      PT SHORT TERM GOAL #3   Title initiate a walking program at home to improve ambulation and begin to wean from wheelchair use at home    Baseline every other day walking within home using walker    Status Achieved             PT Long Term Goals - 02/14/21 0936      PT LONG TERM GOAL #1   Title Pt will be independent with progression of final HEP    Baseline compliant, working on increased standing tolerance and hip flexor stretching in standing    Status On-going      PT LONG TERM GOAL #2   Title improve strength to wean from wheelchair use > or = to 30% of the time for home distances    Baseline getting out of the W/C 2x/day, still using W/C close to 90% of the time, standing more for kitchen and bathroom tasks    Status On-going      PT LONG TERM GOAL #3   Title improve LE flexibility to stand erect with standing x 1 minute    Status On-going      PT LONG TERM GOAL #4   Title improve strength and endurance to walk 50-75 feet with rolling walker with moderate shortness of breath    Baseline walks 50' with RW    Status Achieved      PT LONG TERM GOAL #5   Title perform standing  exercise for 3-5 minutes in the clinic to improve endurance and independence with meal prep and self care tasks    Baseline 3-4'    Status On-going                 Plan - 02/28/21 1001    Clinical Impression Statement Pt has been working on increased frequency of household ambulation.  She was able to perform 2 rounds each of gait and Nustep today, covering 36' then 25' with rolling walker, and performing 8' NuStep for warm up and 4' NuStep cool down, demo'ing improved endurance.  PT added weights to seated ther ex today and cued longer stands between sit to stand reps to work on standing posture and endurance.  She did have increased LBP related to arthritis today rated 7/10 which may be related to weather front today.  She felt relief with seated ball rollouts between gait efforts today.  She continues to use her inhaler several times during session and take very brief seated breaks, less so than in the past.  Pt has been consistent with attendance recently and hopes to be able to return to aquatic center in the future for the benefit of aquatic therapy.  Continue along POC.    Comorbidities obesity, W/C x 1 year, fibromyalgia, depression    Examination-Activity Limitations Stand;Stairs;Squat;Locomotion Level;Transfers;Dressing;Hygiene/Grooming    Rehab Potential Good    PT Frequency 2x / week    PT Duration 8 weeks    PT Treatment/Interventions ADLs/Self Care Home Management;Cryotherapy;Moist Heat;Neuromuscular re-education;Therapeutic exercise;Therapeutic activities;Functional mobility training;Stair training;Gait training;Patient/family education;Wheelchair mobility  training;Manual techniques;Energy conservation;Passive range of motion;Taping;Aquatic Therapy    PT Next Visit Plan 10th visit PN next time, NuStep (2 rounds spread out during appointment), sit to stand with standing endurance between reps, gait endurance (see if can walk beyond 50'), seated ther ex with weights as tol    PT  Home Exercise Plan Access Code: [email or web address]    Consulted and Agree with Plan of Care Patient           Patient will benefit from skilled therapeutic intervention in order to improve the following deficits and impairments:  Abnormal gait,Decreased activity tolerance,Decreased balance,Decreased strength,Postural dysfunction,Impaired flexibility,Hypomobility,Pain,Decreased endurance,Difficulty walking,Decreased range of motion  Visit Diagnosis: Muscle weakness (generalized)  Difficulty in walking, not elsewhere classified  Stiffness of left hip, not elsewhere classified  Stiffness of right hip, not elsewhere classified     Problem List Patient Active Problem List   Diagnosis Date Noted  . Adjustment disorder with mixed disturbance of emotions and conduct 07/28/2020  . Polyp of descending colon 02/15/2020  . Pituitary tumor 08/21/2019  . Primary osteoarthritis of both hips 08/21/2019  . Primary osteoarthritis of both knees 08/21/2019  . Pain in left shoulder 08/21/2019  . Morbid obesity with BMI of 60.0-69.9, adult ([place]) 07/17/2019  . Pedal edema 07/17/2019  . Multilevel degenerative disc disease 10/31/2018  . Abdominal pain 10/28/2018  . Change in bowel function 10/28/2018  . Severe episode of recurrent major depressive disorder, with psychotic features ([place]) 05/26/2018  . Gastritis and gastroduodenitis 04/26/2018  . Atrophic vaginitis 04/01/2018  . Diabetes mellitus ([place]) 03/31/2018  . Cough, persistent 02/08/2018  . Dysphagia 12/14/2017  . 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 Urinary incontinence 07/23/2017  . Early satiety 06/16/2017  . Hx of iron deficiency anemia 05/28/2017  . Throat pain in adult 04/05/2017  . Prediabetes 02/04/2017  . Dyspnea on exertion 12/23/2016  . 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 Hemoptysis 12/23/2016  . Fibromyalgia 08/18/2016  . Chronic lymphocytic thyroiditis 08/18/2016  . Depression  04/01/2016  . OSA (obstructive sleep apnea) 04/01/2016  . Essential hypertension 12/09/2015  . Hypothyroidism 12/09/2015  . Morbid obesity due to excess calories ([place]) 12/09/2015  . Constipation 05/27/2015  . Fatty liver 05/27/2015  . Abdominal pain, chronic, epigastric 07/04/2012  . Anxiety 06/09/2012  . History of gastroesophageal reflux (GERD) 06/09/2012  . Hyperlipidemia 06/09/2012  . Refusal of blood transfusions as patient is Jehovah's Witness 06/09/2012  . 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 Abnormal small bowel biopsy 09/18/2011  . Lactose intolerance 09/18/2011  . GERD 01/21/2010    Baruch Merl, PT 02/28/21 11:04 AM   [facility] [address] [place], [place], [address] Phone: [phone]   Fax:  [phone]  Name: Robin [name] MRN: [id number] Date of Birth: Jun 30, 1956

## 2021-03-05 ENCOUNTER — Other Ambulatory Visit: Payer: Self-pay | Admitting: Internal Medicine

## 2021-03-05 ENCOUNTER — Telehealth: Payer: Self-pay | Admitting: Orthopaedic Surgery

## 2021-03-05 ENCOUNTER — Telehealth: Payer: Self-pay | Admitting: Internal Medicine

## 2021-03-05 ENCOUNTER — Telehealth: Payer: Self-pay

## 2021-03-05 DIAGNOSIS — J309 Allergic rhinitis, unspecified: Secondary | ICD-10-CM

## 2021-03-05 MED ORDER — ATORVASTATIN CALCIUM 40 MG PO TABS: 40.0000 mg | ORAL_TABLET | Freq: Every day | ORAL | 2 refills | Status: DC

## 2021-03-05 MED ORDER — LOSARTAN POTASSIUM 25 MG PO TABS: 25.0000 mg | ORAL_TABLET | Freq: Every day | ORAL | 2 refills | Status: DC

## 2021-03-05 MED ORDER — CELECOXIB 100 MG PO CAPS: ORAL_CAPSULE | Freq: Two times a day (BID) | ORAL | 2 refills | Status: DC

## 2021-03-05 MED ORDER — MONTELUKAST SODIUM 10 MG PO TABS: ORAL_TABLET | Freq: Every day | ORAL | 2 refills | Status: DC

## 2021-03-05 NOTE — Telephone Encounter (Signed)
Pt called today to see about the gel injections Dr. Erlinda Hong had previously dicussed with her. She said she had filled out the paperwork but has not heard anything since and wanted to follow up with it. The best call back number is [phone].

## 2021-03-05 NOTE — Telephone Encounter (Signed)
Following up on a request from patients PCP on assistance in connecting with a psychiatrist in the [place] area. Verified patients name and date of birth. Patient shared she has not be able to find a psychiatrist. Case Manager shared with patient community resources available and information on appointments scheduled for her: intake and psychiatry appt. Case Manager shared address and phone number for the facility as well.   Case Manager encouraged patient to reach out to the clinic for further needs.

## 2021-03-05 NOTE — Telephone Encounter (Signed)
Talked with patient and advised that [employer] application was missing some information and correct information was provided. Per Robin [name] with [employer], application will be noted as completed and I will have a response within 24-72hrs.

## 2021-03-05 NOTE — Telephone Encounter (Signed)
Can you advise? 

## 2021-03-05 NOTE — Telephone Encounter (Signed)
-----   Message from [facility] sent at 03/05/2021  9:03 AM EDT ----- Regarding: [place] The patient has been scheduled for the following appts at [facility] for an intake/assessment 04/30/21 @ 10 am and the 1st appt psychiatry appt 05/15/21 @ 9am. ----- Message ----- From: Ladell Pier, MD Sent: 02/27/2021   2:05 PM EDT To: Darliss Cheney  This patient sees a therapist at [facility].  She used to see a psychiatrist but not anymore.  She needs to get in with 1.  However [facility] has a Event organiser of psychiatrists in the ones where they are not excepting new patients.  She tells me that her therapist has been trying to get her in with a psychiatrist here in [place].  I have submitted a referral to [facility].  Please facilitate getting her in with a psychiatrist for medication management.

## 2021-03-07 ENCOUNTER — Other Ambulatory Visit: Payer: Self-pay

## 2021-03-07 ENCOUNTER — Ambulatory Visit: Payer: Medicaid Other | Attending: Internal Medicine | Admitting: Physical Therapy

## 2021-03-07 ENCOUNTER — Other Ambulatory Visit: Payer: Self-pay | Admitting: Internal Medicine

## 2021-03-07 DIAGNOSIS — M25651 Stiffness of right hip, not elsewhere classified: Secondary | ICD-10-CM | POA: Insufficient documentation

## 2021-03-07 DIAGNOSIS — R262 Difficulty in walking, not elsewhere classified: Secondary | ICD-10-CM | POA: Diagnosis present

## 2021-03-07 DIAGNOSIS — M6281 Muscle weakness (generalized): Secondary | ICD-10-CM | POA: Insufficient documentation

## 2021-03-07 DIAGNOSIS — M25652 Stiffness of left hip, not elsewhere classified: Secondary | ICD-10-CM | POA: Diagnosis present

## 2021-03-07 MED ORDER — LEVOTHYROXINE SODIUM 88 MCG PO TABS: ORAL_TABLET | ORAL | 1 refills | Status: DC

## 2021-03-07 NOTE — Therapy (Signed)
[facility] [address] [place], [place], [address] Phone: [phone]   Fax:  [phone]  Physical Therapy Treatment  Patient Details  Name: Robin [name] MRN: [id number] Date of Birth: 1956-06-13 Referring Provider (PT): Karle Plumber, MD  Progress Note Reporting Period 11/14/20 to 03/07/21  See note below for Objective Data and Assessment of Progress/Goals.       Encounter Date: 03/07/2021   PT End of Session - 03/07/21 1128    Visit Number 10    Date for PT Re-Evaluation 04/11/21    Authorization Type Medicaid: 12 visits 01/27/21 - 04/20/21    Authorization Time Period 12 visits, auth time period 01/27/21 - 04/20/21    Authorization - Visit Number 10    Authorization - Number of Visits 18    PT Start Time 1100    PT Stop Time 1140    PT Time Calculation (min) 40 min    Activity Tolerance Patient limited by fatigue;Patient limited by pain           Past Medical History:  Diagnosis Date  . Anxiety disorder   . Arthritis   . Asthma   . Chronic neck pain   . Constipation   . Depression   . Diabetes mellitus without complication ([place])   . Dyspnea   . GERD (gastroesophageal reflux disease)   . Hiatal hernia    small  . Hyperlipidemia   . Hypertension   . Hypothyroidism   . Morbid obesity with BMI of 50.0-59.9, adult ([place])   . Schatzki's ring    non critical  . Sinusitis   . Sleep apnea    CPAP, Sleep study at [place]  . Sleep apnea     Past Surgical History:  Procedure Laterality Date  . ABDOMINAL HYSTERECTOMY    . ABDOMINAL HYSTERECTOMY  02/18/2000  . CARDIAC CATHETERIZATION  08/18/2003   normal L main/LAD/L Cfx/RCA (Dr. Adora Fridge)  . COLONOSCOPY  2006   Dr. [name], hyperplastic polyps  . COLONOSCOPY  08/06/11   abnormal terminal ileum for 10cm, erosions, geographical ulceration. Bx small bowel mucosa with prominent intramucosal lymphoid aggregates, slightly  inflammed  . COLONOSCOPY WITH PROPOFOL N/A 12/28/2019   Rourk: 4 sessile serrated adenomas removed on the colon.  Next colonoscopy in 3 years.  Marland Kitchen DIRECT LARYNGOSCOPY  03/15/2012   Procedure: DIRECT LARYNGOSCOPY;  Surgeon: [name], MD;  Location: [place];  Service: ENT;  Laterality: N/A; Dr. Wolicki--:>no foreign body seen. normal esophagus to 40cm  . ESOPHAGEAL DILATION  04/17/2015   Procedure: ESOPHAGEAL DILATION;  Surgeon: [name], MD;  [facility];  Service: Endoscopy;;  . ESOPHAGOGASTRODUODENOSCOPY  08/23/2008   MOQ:HUTM distal esophageal erosions consistent with mild erosive reflux esophagitis, otherwise unremarkable esophagus/ Tiny antral erosions of doubtful clinical significance, otherwise normal stomach, patent pylorus, normal D1 and D2  . ESOPHAGOGASTRODUODENOSCOPY  08/06/11   small hh, noncritical Schatzki's ring s/p 61 F  . ESOPHAGOGASTRODUODENOSCOPY N/A 04/17/2015   LYY:TKPTWS s/p dilation  . ESOPHAGOGASTRODUODENOSCOPY (EGD) WITH PROPOFOL N/A 01/20/2018   Dr. [name]: Erosive gastropathy, normal-appearing esophagus status post empiric dilation.  Chronic gastritis, no H. pylori.  . ESOPHAGOSCOPY  03/15/2012   Procedure: ESOPHAGOSCOPY;  Surgeon: [name], MD;  Location: [place];  Service: ENT;  Laterality: N/A;  . KNEE ARTHROSCOPY  04/27/2001  . MALONEY DILATION N/A 01/20/2018   Procedure: Venia Minks DILATION;  Surgeon: [name], MD;  [facility];  Service: Endoscopy;  Laterality: N/A;  . [place]  2003   negative bruce protocol exercise tress test; EF 68%; intermediate risk study due to evidence of anterior wall ischemia extending from mid-ventricle to apex  . PITUITARY SURGERY  06/2012   benign tumor, surgeon [facility]  . POLYPECTOMY  12/28/2019   Procedure: POLYPECTOMY;  Surgeon: [name], MD;  [facility];  Service: Endoscopy;;  . RECTOCELE REPAIR    . TRANSTHORACIC ECHOCARDIOGRAM  2003   EF normal  .  VAGINAL PROLAPSE REPAIR    . VIDEO BRONCHOSCOPY Bilateral 03/08/2017   Procedure: VIDEO BRONCHOSCOPY WITHOUT FLUORO;  Surgeon: Javier Glazier, MD;  Location: [place];  Service: Cardiopulmonary;  Laterality: Bilateral;    There were no vitals filed for this visit.   Subjective Assessment - 03/07/21 1105    Subjective Pain in knees and hip today.  Overall therapy is going well.    Pertinent History use of motorized W/C due to weakness and LE pain.    Patient Stated Goals get stronger, walk more, stand longer    Currently in Pain? Yes    Pain Score 6     Pain Location Knee    Pain Orientation Right;Left    Pain Type Chronic pain              [facility] PT Assessment - 03/07/21 0001      Standardized Balance Assessment   Five times sit to stand comments  with UE use chair +cushion 33 sec                         [facility] Adult PT Treatment/Exercise - 03/07/21 0001      Ambulation/Gait   Ambulation/Gait Yes    Ambulation/Gait Assistance 6: Modified independent (Device/Increase time);5: Supervision    Assistive device Rolling walker    Gait Comments 22' x2 with improved weight shifting to Rt/Lt, slow gait, knees flexed and trunk flexed, most limited by back pain today      Lumbar Exercises: Stretches   Other Lumbar Stretch Exercise seated trunk flexion large blue physioball rollouts hands on ball x 10 reps   from chair + black pad   Other Lumbar Stretch Exercise Pt cued erect standing to tolerance with standing endurance with sit to stands      Lumbar Exercises: Aerobic   Nustep L1 x 5' for warm up, 5' end of session      Knee/Hip Exercises: Aerobic   Nustep      Knee/Hip Exercises: Standing   Other Standing Knee Exercises standing endurance with sit to stand reps: 1x5 15" stands/rep, 1x5 26" stands/rep      Knee/Hip Exercises: Seated   Long Arc Quad Strengthening;Both;2 sets;10 reps;Weights    Long Arc Quad Weight 2 lbs.    Marching Strengthening;Both;2  sets;10 reps    Marching Limitations 2# weights on ankles    Sit to Sand 2 sets;5 reps   stand 5 sec/rep 1st set, stand 10 sec/rep 2nd set                   PT Short Term Goals - 03/07/21 1154      PT SHORT TERM GOAL #1   Title independent with initial HEP    Status Achieved      PT SHORT TERM GOAL #2   Title walk with rolling walker x 30 feet and demonstrate moderate shortness of breath  Status Achieved      PT SHORT TERM GOAL #3   Title initiate a walking program at home to improve ambulation and begin to wean from wheelchair use at home    Status Achieved             PT Long Term Goals - 03/07/21 1155      PT LONG TERM GOAL #1   Title Pt will be independent with progression of final HEP    Time 8    Period Weeks    Status On-going      PT LONG TERM GOAL #2   Title improve strength to wean from wheelchair use > or = to 30% of the time for home distances    Time 8    Period Weeks    Status On-going      PT LONG TERM GOAL #3   Title improve LE flexibility to stand erect with standing x 1 minute    Time 8    Period Weeks    Status On-going      PT LONG TERM GOAL #4   Title improve strength and endurance to walk 50-75 feet with rolling walker with moderate shortness of breath    Status Achieved      PT LONG TERM GOAL #5   Title perform standing exercise for 3-5 minutes in the clinic to improve endurance and independence with meal prep and self care tasks    Time 8    Period Weeks    Status On-going                 Plan - 03/07/21 1130    Clinical Impression Statement The patient's ambulation distance is limited by shortness of breath as well as back and knee pain.  She is able to stand for longer duration intervals compared to previous visits and 2 rounds of gait with RW 52 feet.  Verbal cues for erect posture secondary to excessive trunk flexon and head down with standing and gait.  Inhaler use 1x during session.  Making progress toward  goals although slower progression needed secondary to numerous comorbidities.    Comorbidities obesity, W/C x 1 year, fibromyalgia, depression    Examination-Activity Limitations Stand;Stairs;Squat;Locomotion Level;Transfers;Dressing;Hygiene/Grooming    PT Frequency 2x / week    PT Duration 8 weeks    PT Treatment/Interventions ADLs/Self Care Home Management;Cryotherapy;Moist Heat;Neuromuscular re-education;Therapeutic exercise;Therapeutic activities;Functional mobility training;Stair training;Gait training;Patient/family education;Wheelchair mobility training;Manual techniques;Energy conservation;Passive range of motion;Taping;Aquatic Therapy    PT Next Visit Plan NuStep (2 rounds spread out during appointment), sit to stand with standing endurance between reps, gait endurance (see if can walk beyond 50'), seated ther ex with weights as tol    PT Home Exercise Plan Access Code: [facility]           Patient will benefit from skilled therapeutic intervention in order to improve the following deficits and impairments:  Abnormal gait,Decreased activity tolerance,Decreased balance,Decreased strength,Postural dysfunction,Impaired flexibility,Hypomobility,Pain,Decreased endurance,Difficulty walking,Decreased range of motion  Visit Diagnosis: Muscle weakness (generalized)  Difficulty in walking, not elsewhere classified  Stiffness of left hip, not elsewhere classified  Stiffness of right hip, not elsewhere classified     Problem List Patient Active Problem List   Diagnosis Date Noted  . Adjustment disorder with mixed disturbance of emotions and conduct 07/28/2020  . Polyp of descending colon 02/15/2020  . Pituitary tumor 08/21/2019  . Primary osteoarthritis of both hips 08/21/2019  . Primary osteoarthritis of both knees 08/21/2019  . Pain in  left shoulder 08/21/2019  . Morbid obesity with BMI of 60.0-69.9, adult ([place]) 07/17/2019  . Pedal edema 07/17/2019  . Multilevel degenerative disc  disease 10/31/2018  . Abdominal pain 10/28/2018  . Change in bowel function 10/28/2018  . Severe episode of recurrent major depressive disorder, with psychotic features ([place]) 05/26/2018  . Gastritis and gastroduodenitis 04/26/2018  . Atrophic vaginitis 04/01/2018  . Diabetes mellitus ([place]) 03/31/2018  . Cough, persistent 02/08/2018  . Dysphagia 12/14/2017  . 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 Urinary incontinence 07/23/2017  . Early satiety 06/16/2017  . Hx of iron deficiency anemia 05/28/2017  . Throat pain in adult 04/05/2017  . Prediabetes 02/04/2017  . Dyspnea on exertion 12/23/2016  . 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 Hemoptysis 12/23/2016  . Fibromyalgia 08/18/2016  . Chronic lymphocytic thyroiditis 08/18/2016  . Depression 04/01/2016  . OSA (obstructive sleep apnea) 04/01/2016  . Essential hypertension 12/09/2015  . Hypothyroidism 12/09/2015  . Morbid obesity due to excess calories ([place]) 12/09/2015  . Constipation 05/27/2015  . Fatty liver 05/27/2015  . Abdominal pain, chronic, epigastric 07/04/2012  . Anxiety 06/09/2012  . History of gastroesophageal reflux (GERD) 06/09/2012  . Hyperlipidemia 06/09/2012  . Refusal of blood transfusions as patient is Jehovah's Witness 06/09/2012  . 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 Abnormal small bowel biopsy 09/18/2011  . Lactose intolerance 09/18/2011  . GERD 01/21/2010   Ruben Im, PT 03/07/21 11:58 AM Phone: [phone] Fax: [phone] Alvera Singh 03/07/2021, 11:56 AM  [facility] [address] [place], [place], [address] Phone: [phone]   Fax:  [phone]  Name: NOORAH GIAMMONA MRN: [id number] Date of Birth: [birth year]

## 2021-03-11 ENCOUNTER — Other Ambulatory Visit: Payer: Self-pay

## 2021-03-11 NOTE — Patient Outreach (Signed)
Patient compliant on medications, needs new fill of Gabapentin delivered.  Coordinated with [facility] to get it sent to her on 03/12/21  Patient states she is very happy with [facility] services

## 2021-03-12 ENCOUNTER — Other Ambulatory Visit: Payer: Self-pay | Admitting: Gastroenterology

## 2021-03-13 NOTE — Telephone Encounter (Signed)
There are multiple PPIs listed on patient's med list.  Please verify that she is only taking Nexium and not Dexilant or pantoprazole.

## 2021-03-14 NOTE — Telephone Encounter (Signed)
Verified that pt is only taking Nexium.  She is not taking Dexilant or Pantoprazole.

## 2021-03-18 ENCOUNTER — Telehealth: Payer: Self-pay | Admitting: *Deleted

## 2021-03-18 MED ORDER — LINACLOTIDE 290 MCG PO CAPS: ORAL_CAPSULE | ORAL | 3 refills | Status: DC

## 2021-03-18 MED ORDER — ESOMEPRAZOLE MAGNESIUM 40 MG PO CPDR: 40.0000 mg | DELAYED_RELEASE_CAPSULE | Freq: Two times a day (BID) | ORAL | 5 refills | Status: DC

## 2021-03-18 NOTE — Telephone Encounter (Signed)
Completed.

## 2021-03-18 NOTE — Telephone Encounter (Signed)
-----   Message from Johnston Ebbs sent at 03/18/2021  2:38 PM EDT ----- Regarding: FW: Linzess and Esomeprazole Refill please  ----- Message ----- From: Lane Hacker, [facility] Sent: 03/18/2021  12:17 PM EDT To: [name], MD, Johnston Ebbs Subject: Linzess and Esomeprazole Refill please         Good afternoon Dr. Alford Highland! I see Dr. [name] is on vacation, I was wondering if you could send a refill of this patient's Linzess and Esomeprazole in please?  It may be a new Pharmacy, could you make sure it goes to  [place]  Thank you so much!

## 2021-03-18 NOTE — Patient Outreach (Signed)
Aspirin  81 mg   1     Atorvastatin  40 mg     1   Duloxetine  30 mg   1     Esomeprazole  40 mg   1  1   Hydralazine  50 mg   1  1   HCTZ  25 mg   1     Levothyroxine  88 mcg  1      Linzess  290 mcg  1     Losartan  25 mg     1   Metformin  100 mg   1  1   Metoprolol Tartrate 25 mg   1  1   Montelukast  10 mg     1   Spironolactone  25 mg   1     Vitamin D     1         Due for refills on 03/24/21, called Dr. Rolena Infante at [phone] but the secretary didn't know who that provider was. Will ask Dr. Wynetta Emery for refills. Patient said she had different number for Dr. Rolena Infante but would have to call me back, will wait for phone call for refill of Duloxetine

## 2021-03-18 NOTE — Addendum Note (Signed)
Addended by: Annitta Needs on: 03/18/2021 04:15 PM   Modules accepted: Orders

## 2021-03-19 ENCOUNTER — Ambulatory Visit: Payer: Medicaid Other

## 2021-03-19 ENCOUNTER — Other Ambulatory Visit: Payer: Self-pay | Admitting: Internal Medicine

## 2021-03-19 DIAGNOSIS — F341 Dysthymic disorder: Secondary | ICD-10-CM

## 2021-03-19 MED ORDER — DULOXETINE HCL 30 MG PO CPEP: ORAL_CAPSULE | Freq: Every day | ORAL | 1 refills | Status: DC

## 2021-03-19 MED ORDER — SPIRONOLACTONE 25 MG PO TABS: 25.0000 mg | ORAL_TABLET | Freq: Every day | ORAL | 1 refills | Status: DC

## 2021-03-19 MED ORDER — VITAMIN D (ERGOCALCIFEROL) 1.25 MG (50000 UNIT) PO CAPS: 50000.0000 [IU] | ORAL_CAPSULE | ORAL | 0 refills | Status: DC

## 2021-03-19 MED ORDER — METFORMIN HCL 500 MG PO TABS: ORAL_TABLET | ORAL | 1 refills | Status: DC

## 2021-03-20 ENCOUNTER — Ambulatory Visit (INDEPENDENT_AMBULATORY_CARE_PROVIDER_SITE_OTHER): Payer: Medicaid Other | Admitting: Family Medicine

## 2021-03-24 NOTE — Patient Outreach (Signed)
Requesting Albuterol and Test Strips. Will deliver tomorrow

## 2021-03-25 ENCOUNTER — Ambulatory Visit: Payer: Medicaid Other | Admitting: Physical Therapy

## 2021-03-25 ENCOUNTER — Other Ambulatory Visit: Payer: Self-pay | Admitting: Internal Medicine

## 2021-03-25 ENCOUNTER — Encounter: Payer: Self-pay | Admitting: Physical Therapy

## 2021-03-25 ENCOUNTER — Other Ambulatory Visit: Payer: Self-pay

## 2021-03-25 DIAGNOSIS — R262 Difficulty in walking, not elsewhere classified: Secondary | ICD-10-CM

## 2021-03-25 DIAGNOSIS — M25652 Stiffness of left hip, not elsewhere classified: Secondary | ICD-10-CM

## 2021-03-25 DIAGNOSIS — M6281 Muscle weakness (generalized): Secondary | ICD-10-CM | POA: Diagnosis not present

## 2021-03-25 DIAGNOSIS — M25651 Stiffness of right hip, not elsewhere classified: Secondary | ICD-10-CM

## 2021-03-25 MED ORDER — ALBUTEROL SULFATE HFA 108 (90 BASE) MCG/ACT IN AERS: 1.0000 | INHALATION_SPRAY | Freq: Four times a day (QID) | RESPIRATORY_TRACT | 6 refills | Status: DC | PRN

## 2021-03-25 NOTE — Patient Outreach (Signed)
Patient requested albuterol but is out of refills. Msg'd Dr. Wynetta Emery and tried calling patient alerting her but her VM box is full.

## 2021-03-25 NOTE — Therapy (Signed)
[facility] [address], [place], [place], [address] Phone: [phone]   Fax:  [phone]  Physical Therapy Treatment  Patient Details  Name: Robin [name] MRN: [id number] Date of Birth: 1956/07/07 Referring Provider (PT): Karle Plumber, MD   Encounter Date: 03/25/2021   PT End of Session - 03/25/21 1009     Visit Number 11    Date for PT Re-Evaluation 04/11/21    Authorization Type Medicaid: 12 visits 01/27/21 - 04/20/21    Authorization Time Period 12 visits, auth time period 01/27/21 - 04/20/21    Authorization - Visit Number 11    Authorization - Number of Visits 18    PT Start Time 0935    PT Stop Time 1013    PT Time Calculation (min) 38 min    Activity Tolerance Patient limited by fatigue;Patient limited by pain    Behavior During Therapy [facility] for tasks assessed/performed             Past Medical History:  Diagnosis Date   Anxiety disorder    Arthritis    Asthma    Chronic neck pain    Constipation    Depression    Diabetes mellitus without complication (HCC)    Dyspnea    GERD (gastroesophageal reflux disease)    Hiatal hernia    small   Hyperlipidemia    Hypertension    Hypothyroidism    Morbid obesity with BMI of 50.0-59.9, adult ([place])    Schatzki's ring    non critical   Sinusitis    Sleep apnea    CPAP, Sleep study at [place]   Sleep apnea     Past Surgical History:  Procedure Laterality Date   ABDOMINAL HYSTERECTOMY     ABDOMINAL HYSTERECTOMY  02/18/2000   CARDIAC CATHETERIZATION  08/18/2003   normal L main/LAD/L Cfx/RCA (Dr. Adora Fridge)   COLONOSCOPY  2006   Dr. [name], hyperplastic polyps   COLONOSCOPY  08/06/11   abnormal terminal ileum for 10cm, erosions, geographical ulceration. Bx small bowel mucosa with prominent intramucosal lymphoid aggregates, slightly inflammed   COLONOSCOPY WITH PROPOFOL N/A 12/28/2019   Rourk: 4 sessile serrated adenomas  removed on the colon.  Next colonoscopy in 3 years.   DIRECT LARYNGOSCOPY  03/15/2012   Procedure: DIRECT LARYNGOSCOPY;  Surgeon: [name], MD;  Location: [place];  Service: ENT;  Laterality: N/A; Dr. Wolicki--:>no foreign body seen. normal esophagus to 40cm   ESOPHAGEAL DILATION  04/17/2015   Procedure: ESOPHAGEAL DILATION;  Surgeon: [name], MD;  [facility];  Service: Endoscopy;;   ESOPHAGOGASTRODUODENOSCOPY  08/23/2008   DXI:PJAS distal esophageal erosions consistent with mild erosive reflux esophagitis, otherwise unremarkable esophagus/ Tiny antral erosions of doubtful clinical significance, otherwise normal stomach, patent pylorus, normal D1 and D2   ESOPHAGOGASTRODUODENOSCOPY  08/06/11   small hh, noncritical Schatzki's ring s/p 47 F   ESOPHAGOGASTRODUODENOSCOPY N/A 04/17/2015   NKN:LZJQBH s/p dilation   ESOPHAGOGASTRODUODENOSCOPY (EGD) WITH PROPOFOL N/A 01/20/2018   Dr. [name]: Erosive gastropathy, normal-appearing esophagus status post empiric dilation.  Chronic gastritis, no H. pylori.   ESOPHAGOSCOPY  03/15/2012   Procedure: ESOPHAGOSCOPY;  Surgeon: [name], MD;  Location: [place];  Service: ENT;  Laterality: N/A;   KNEE ARTHROSCOPY  04/27/2001   MALONEY DILATION N/A 01/20/2018   Procedure: Venia Minks DILATION;  Surgeon: [name], MD;  [facility];  Service: Endoscopy;  Laterality: N/A;   NM MYOCAR  PERF WALL MOTION  2003   negative bruce protocol exercise tress test; EF 68%; intermediate risk study due to evidence of anterior wall ischemia extending from mid-ventricle to apex   PITUITARY SURGERY  06/2012   benign tumor, surgeon at [facility]   POLYPECTOMY  12/28/2019   Procedure: POLYPECTOMY;  Surgeon: [name], MD;  [facility];  Service: Endoscopy;;   RECTOCELE REPAIR     TRANSTHORACIC ECHOCARDIOGRAM  2003   EF normal   VAGINAL PROLAPSE REPAIR     VIDEO BRONCHOSCOPY Bilateral 03/08/2017   Procedure: VIDEO BRONCHOSCOPY WITHOUT  FLUORO;  Surgeon: Javier Glazier, MD;  Location: [place];  Service: Cardiopulmonary;  Laterality: Bilateral;    There were no vitals filed for this visit.   Subjective Assessment - 03/25/21 0935     Subjective Pt states diffuse multi-joint pain is limiting her from progressing to more walker use at home to only about 15% of the time (w/c other 85%).    Pertinent History use of motorized W/C due to weakness and LE pain.    Limitations Standing;Walking    How long can you stand comfortably? 5 min max    How long can you walk comfortably? 20-25 steps with walker    Patient Stated Goals get stronger, walk more, stand longer    Currently in Pain? Yes    Pain Score 6     Pain Location Knee    Pain Orientation Right;Left    Pain Descriptors / Indicators Aching    Pain Type Chronic pain    Pain Onset More than a month ago    Aggravating Factors  weightbearing    Pain Relieving Factors meds, NWB                               [facility] Adult PT Treatment/Exercise - 03/25/21 0001       Ambulation/Gait   Ambulation/Gait Yes    Ambulation/Gait Assistance 6: Modified independent (Device/Increase time)    Assistive device Rolling walker    Pre-Gait Activities standing 2' lateral weight shifts and stagger stance weight shifts, limited to 2' due to LBP    Gait Comments 40' in 3 min, flexed trunk and knees Rt>Lt, slower pace noted today due to forgotten inhaler and pain      Lumbar Exercises: Aerobic   Nustep L2 x 10' end of session      Knee/Hip Exercises: Seated   Long Arc Quad Strengthening;Both;2 sets;10 reps;Weights    Long Arc Quad [employer] --   2.5lb   Ball Squeeze 2x10x3" hold    Marching Strengthening;Both;2 sets;10 reps    Marching Limitations 2.5# weights on ankles    Sit to [employer] 2 sets;5 reps   stand 5 sec/rep 1st set, stand 10 sec/rep 2nd set                     PT Short Term Goals - 03/25/21 0957       PT SHORT TERM GOAL #1   Title  independent with initial HEP    Status Achieved      PT SHORT TERM GOAL #2   Title walk with rolling walker x 30 feet and demonstrate moderate shortness of breath    Status Achieved      PT SHORT TERM GOAL #3   Title initiate a walking program at home to improve ambulation and begin to wean from wheelchair use at  home    Baseline partial compliance due to pain    Status Achieved               PT Long Term Goals - 03/25/21 0958       PT LONG TERM GOAL #1   Title Pt will be independent with progression of final HEP    Baseline partial compliance, discussed a need for more frequent stand/walk intervals    Status Achieved      PT LONG TERM GOAL #2   Title improve strength to wean from wheelchair use > or = to 30% of the time for home distances    Baseline 15% due to pain    Status Partially Met      PT LONG TERM GOAL #3   Title improve LE flexibility to stand erect with standing x 1 minute    Baseline 2' stand but with ongoing flexed knees and trunk, improved from initial visit    Status Partially Met      PT LONG TERM GOAL #4   Title improve strength and endurance to walk 50-75 feet with rolling walker with moderate shortness of breath    Baseline 45' with RW, mod SOB and slow pace    Status Achieved      PT LONG TERM GOAL #5   Title perform standing exercise for 3-5 minutes in the clinic to improve endurance and independence with meal prep and self care tasks    Baseline 3-4'    Status Achieved                   Plan - 03/25/21 1000     Clinical Impression Statement Pt has met or parially met at LTGs.  She is limited to standing x 2' today and ambulation to 54' at a prior session secondary to both LBP/bil knee pain and SOB.  Pt forgot inhaler today so needed to cut ambulation distance/time shorter and use a slower pace.  She ambulated with rolling walker x 40' in 3 min today with flexed trunk and knees Rt>Lt.  She was limited by fear of SOB and diffuse  multi-joint pain today.  Pt states she is using her walker at home approx 15% of the time (goal was 30%) limited by multi-joint pain.  PT and Pt discussed a need for ongoing compliance with more frequent, short duration efforts to stand and walk with walker within home to work toward greater endurance.  Pt has met a plateau in progress so PT recommends d/c at this time to HEP and seated home videos that Pt does 2x/week.  PT encouraged goal of stand/walk at home every daily hour as tolerated.  Pt understands she may return with new PT Rx as needed.    Comorbidities obesity, W/C x 1 year, fibromyalgia, depression    Examination-Activity Limitations Stand;Stairs;Squat;Locomotion Level;Transfers;Dressing;Hygiene/Grooming    Stability/Clinical Decision Making Evolving/Moderate complexity    Rehab Potential Good    PT Frequency 2x / week    PT Duration 8 weeks    PT Treatment/Interventions ADLs/Self Care Home Management;Cryotherapy;Moist Heat;Neuromuscular re-education;Therapeutic exercise;Therapeutic activities;Functional mobility training;Stair training;Gait training;Patient/family education;Wheelchair mobility training;Manual techniques;Energy conservation;Passive range of motion;Taping;Aquatic Therapy    PT Next Visit Plan d/c to HEP, walk/stand every hour at home as tol    PT Home Exercise Plan Access Code: [facility]    Consulted and Agree with Plan of Care Patient             Patient will benefit from skilled  therapeutic intervention in order to improve the following deficits and impairments:  Abnormal gait, Decreased activity tolerance, Decreased balance, Decreased strength, Postural dysfunction, Impaired flexibility, Hypomobility, Pain, Decreased endurance, Difficulty walking, Decreased range of motion  Visit Diagnosis: Muscle weakness (generalized)  Difficulty in walking, not elsewhere classified  Stiffness of left hip, not elsewhere classified  Stiffness of right hip, not elsewhere  classified     Problem List Patient Active Problem List   Diagnosis Date Noted   Adjustment disorder with mixed disturbance of emotions and conduct 07/28/2020   Polyp of descending colon 02/15/2020   Pituitary tumor 08/21/2019   Primary osteoarthritis of both hips 08/21/2019   Primary osteoarthritis of both knees 08/21/2019   Pain in left shoulder 08/21/2019   Morbid obesity with BMI of 60.0-69.9, adult ([place]) 07/17/2019   Pedal edema 07/17/2019   Multilevel degenerative disc disease 10/31/2018   Abdominal pain 10/28/2018   Change in bowel function 10/28/2018   Severe episode of recurrent major depressive disorder, with psychotic features ([place]) 05/26/2018   Gastritis and gastroduodenitis 04/26/2018   Atrophic vaginitis 04/01/2018   Diabetes mellitus ([place]) 03/31/2018   Cough, persistent 02/08/2018   Dysphagia 12/14/2017   Generalized OA 07/30/2017   Urinary incontinence 07/23/2017   Early satiety 06/16/2017   Hx of iron deficiency anemia 05/28/2017   Throat pain in adult 04/05/2017   Prediabetes 02/04/2017   Dyspnea on exertion 12/23/2016   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 12/23/2016   Hemoptysis 12/23/2016   Fibromyalgia 08/18/2016   Chronic lymphocytic thyroiditis 08/18/2016   Depression 04/01/2016   OSA (obstructive sleep apnea) 04/01/2016   Essential hypertension 12/09/2015   Hypothyroidism 12/09/2015   Morbid obesity due to excess calories ([place]) 12/09/2015   Constipation 05/27/2015   Fatty liver 05/27/2015   Abdominal pain, chronic, epigastric 07/04/2012   Anxiety 06/09/2012   History of gastroesophageal reflux (GERD) 06/09/2012   Hyperlipidemia 06/09/2012   Refusal of blood transfusions as patient is Jehovah's Witness 06/09/2012   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   Education / Equipment: HEP   Patient agrees to discharge. Patient goals were partially met. Patient is being discharged due to lack of progress.   Venetia Night Natha Guin, PT 03/25/21 10:11 AM  [facility] [address], [place], [place], [address] Phone: [phone]   Fax:  [phone]  Name: Robin [name] MRN: [id number] Date of Birth: 03-22-1956

## 2021-03-26 ENCOUNTER — Other Ambulatory Visit: Payer: Self-pay

## 2021-03-26 ENCOUNTER — Other Ambulatory Visit: Payer: Self-pay | Admitting: Internal Medicine

## 2021-03-27 ENCOUNTER — Ambulatory Visit: Payer: Medicaid Other

## 2021-03-27 ENCOUNTER — Telehealth: Payer: Self-pay | Admitting: Internal Medicine

## 2021-03-27 NOTE — Telephone Encounter (Signed)
Copied from [place] [id number]. Topic: General - Other >> Mar 27, 2021 11:28 AM Celene Kras wrote: Reason for CRM: Elmyra Ricks, from [facility], calling stating that she is needing to check on the status of a medical necessity for pt. She states that they faxed this over on 03/24/21. Please advise.

## 2021-03-27 NOTE — Telephone Encounter (Signed)
Once paperwork is received back from provider it will be faxed

## 2021-04-03 ENCOUNTER — Ambulatory Visit (INDEPENDENT_AMBULATORY_CARE_PROVIDER_SITE_OTHER): Payer: Medicaid Other | Admitting: Family Medicine

## 2021-04-14 ENCOUNTER — Ambulatory Visit: Payer: Medicaid Other | Admitting: Dietician

## 2021-04-15 ENCOUNTER — Other Ambulatory Visit (HOSPITAL_BASED_OUTPATIENT_CLINIC_OR_DEPARTMENT_OTHER): Payer: Medicaid Other | Admitting: Internal Medicine

## 2021-04-16 ENCOUNTER — Other Ambulatory Visit: Payer: Self-pay

## 2021-04-16 NOTE — Patient Outreach (Signed)
Due for meds on 04/23/21  Linzess   290 mcg      Duloxetine   30 mg  1    Spironolactone   25 mg  1    Aspirin   81 mg  1    Esomeprazole   40 mg  1  1  Hydralazine   50 mg  1  1  HCTZ   25 mg  1    Gabapentin   600 mg      Montelukast   10 mg    1  Atorvastatin   40 mg    1  Losartan   25 mg    1  Levothyroxine   88 mcg 1     Metformin   500 mg  2  1  Metoprolol Tartrate  75 mg  1  1  Vitamin D2   1250 mcg 1               Stated does not need TS/Lancets/Albuterol

## 2021-04-17 ENCOUNTER — Ambulatory Visit (INDEPENDENT_AMBULATORY_CARE_PROVIDER_SITE_OTHER): Payer: Medicaid Other | Admitting: Family Medicine

## 2021-04-17 ENCOUNTER — Other Ambulatory Visit (INDEPENDENT_AMBULATORY_CARE_PROVIDER_SITE_OTHER): Payer: Self-pay

## 2021-04-21 ENCOUNTER — Telehealth: Payer: Medicaid Other | Admitting: Orthopaedic Surgery

## 2021-04-21 NOTE — Telephone Encounter (Signed)
Pt wondering if she is able to go ahead and schedule her gel injection?   CB [phone]

## 2021-04-22 ENCOUNTER — Other Ambulatory Visit: Payer: Self-pay | Admitting: Physician Assistant

## 2021-04-22 NOTE — Telephone Encounter (Signed)
Talked with Bruce at [facility]  and was advised that application is still being processed for approval.  Talked with patient and advised her of message above and that with [employer], it does take a long time to be approved.  Patient will get a call once I receive an approval from [employer].

## 2021-04-24 LAB — HM DIABETES EYE EXAM

## 2021-04-30 ENCOUNTER — Ambulatory Visit (INDEPENDENT_AMBULATORY_CARE_PROVIDER_SITE_OTHER): Payer: Medicaid Other | Admitting: Licensed Clinical Social Worker

## 2021-04-30 ENCOUNTER — Other Ambulatory Visit: Payer: Self-pay

## 2021-04-30 DIAGNOSIS — F333 Major depressive disorder, recurrent, severe with psychotic symptoms: Secondary | ICD-10-CM

## 2021-04-30 NOTE — Progress Notes (Signed)
Comprehensive Clinical Assessment (CCA) Screening, Triage and Referral Note  04/30/2021 EMONY [name] [id number]  Chief Complaint:  Chief Complaint  Patient presents with   Depression   Visit Diagnosis: MDD    Client is a 65 year old female. Client is referred by [facility] for a depression.   Client states mental health symptoms as evidenced by:   Depression Difficulty Concentrating; Fatigue; Hopelessness; Irritability; Increase/decrease in appetite; Sleep (too much or little); Worthlessness; Weight gain/loss Difficulty Concentrating; Fatigue; Hopelessness; Irritability; Increase/decrease in appetite; Sleep (too much or little); Worthlessness; Weight gain/loss      Mania None; Racing thoughts None; Racing thoughts  Anxiety Worrying; Sleep; Difficulty concentrating Worrying; Sleep; Difficulty concentrating  Psychosis None None  Trauma None None  Obsessions None None  Compulsions None None  Inattention N/A; None N/A; None  Hyperactivity/Impulsivity N/A N/A  Oppositional/Defiant Behaviors None None  Emotional Irregularity Intense/unstable relationships; Potentially harmful impulsivity; Chronic feelings of emptiness Intense/unstable relationships; Potentially harmful impulsivity; Chronic feelings of emptiness    Client denies suicidal and homicidal ideations at this time: Pt states she has had thoughts of hanging her spouse in the past but not in the last month.   Client denies hallucinations and delusions currently  Client was screened for the following SDOH: smoking, transportation, exercise, stress/tension, depression   Assessment Information that integrates subjective and objective details with a therapist's professional interpretation:     Patient was alert and oriented x5.  She presented casually dressed and engaged well in initial therapy/CCA medication management assessment.  Patient was pleasant cooperative and maintained good Eye contact in session.  She presented today with  anxious depressed and flat mood\affect.   Patient's primary stressors are relationship.  Pt states that her husband has been cheating on her for many years.  Patient and husband have been together for the past 30 years.  Patient has history of psychiatric admission due to homicidal ideations towards her spouse.  Robin [name] reports that she has no homicidal ideations currently with a plan but does have passive thoughts in the last 30 days of killing her husband with no intent to carry out plan.  Patient is currently managed on Cymbalta.  Patient would like to be established with a medication provider here at [facility].  She was being managed at [facility] in [facility] but they lost their provider.  Patient is seeing a therapist at [facility] weekly.  Robin [name] was notified that if she cannot stay with [facility] for therapy services 1 time weekly that she is welcome to continue therapy here at [facility] 1-2 times monthly.  Patient reports that she lives in [place] currently but is a [facility] resident it would be easier to maintain weekly therapy sessions closer to where she lives.  Patient reports other stressors as illness.  Rec is currently on disability for chronic osteoarthritis.  As evidenced by being in a motorized electric wheelchair.  She reports lack of motivation and isolation due to to her illness.  This has increased her depression.  Patient states that this time the Cymbalta is not effective as she has noticed no difference in symptoms.  Goal for patient is to follow-up with medication provider here at [facility] on 05-15-2021 with nurse practitioner Burt Ek.  Client meets criteria for: MDD    Client states use of the following substances: None reported    Treatment recommendations are included plan: Continue to f/u weekly with [facility] in [place] and  f/u in next 14 days with medication provider at [facility].     Client agreed with treatment recommendations.    Patient Reported Information How did you hear about [name]? Self  What Is the Reason for Your Visit/Call Today? Pt wants to estblish with a medication provider  How Long Has This Been Causing You Problems? 1-6 months  What Do You Feel Would Help You the Most Today? Treatment for Depression or other mood problem   Have You Recently Had Any Thoughts About Hurting Yourself? No  Are You Planning to Commit Suicide/Harm Yourself At This time? No   Have you Recently Had Thoughts About [place]? Yes  Are You Planning to Harm Someone at This Time? No   Have You Used Any Alcohol or Drugs in the Past 24 Hours? No   Do You Currently Have a Therapist/Psychiatrist? No  Have You Been Recently Discharged From Any Office Practice or Programs? No   CCA Screening Triage Referral Assessment Type of Contact: Face-to-Face  Telemedicine Service Delivery:   Is this Initial or Reassessment? Initial Assessment  Date Telepsych consult ordered in [facility]:  07/27/20  Time Telepsych consult ordered in [facility]:  1441  Location of Assessment: [facility] Assessment Services  Provider Location: [facility] Assessment Services   Collateral Involvement: TTS was able to maintain collater from patient's daughter, Robin [name] [phone]  Is CPS involved or ever been involved? Never  Is APS involved or ever been involved? Never   Patient Determined To Be At Risk for Harm To Self or Others Based on Review of Patient Reported Information or Presenting Complaint? No  Method: Plan without intent  Availability of Means: Has close by  Intent: Intends to cause physical harm but not necessarily death (patient is unable to ambulate to carry out plan)  Notification Required: -- (husband is aware of situation)  Additional Information for Danger to Others Potential: Active psychosis (delusional)  Additional Comments for Danger to Others  Potential: Patient is not physically able to carry out plan  Are There Guns or Other Weapons in [place]? No   Contacted To Inform of Risk of Harm To Self or Others: Other: Comment (husband and daughter are aware of situation)   Does Patient Present under Involuntary Commitment? No   [place] of Residence: [place]   Patient Currently Receiving the Following Services: Not Receiving Services   Options For Referral: Inpatient Hospitalization   Discharge Disposition:     Dory Horn, LCSW

## 2021-05-01 ENCOUNTER — Ambulatory Visit (INDEPENDENT_AMBULATORY_CARE_PROVIDER_SITE_OTHER): Payer: Medicaid Other | Admitting: Family Medicine

## 2021-05-02 ENCOUNTER — Other Ambulatory Visit: Payer: Self-pay

## 2021-05-02 ENCOUNTER — Telehealth: Payer: Self-pay

## 2021-05-02 ENCOUNTER — Other Ambulatory Visit: Payer: Self-pay | Admitting: Physician Assistant

## 2021-05-02 MED ORDER — TRAMADOL HCL 50 MG PO TABS
50.0000 mg | ORAL_TABLET | Freq: Two times a day (BID) | ORAL | 2 refills | Status: DC | PRN
  Filled 2021-05-02: qty 14, 7d supply, fill #0
  Filled 2021-05-09: qty 60, 30d supply, fill #0
  Filled 2021-05-12: qty 14, 7d supply, fill #0
  Filled 2021-05-22: qty 14, 7d supply, fill #1
  Filled 2021-06-10: qty 14, 7d supply, fill #2
  Filled 2021-06-23: qty 14, 7d supply, fill #3
  Filled 2021-07-03: qty 14, 7d supply, fill #4
  Filled 2021-07-21: qty 14, 7d supply, fill #5
  Filled 2021-07-31: qty 14, 7d supply, fill #6
  Filled 2021-08-18: qty 14, 7d supply, fill #7
  Filled 2021-09-01: qty 14, 7d supply, fill #8
  Filled 2021-09-11: qty 14, 7d supply, fill #9
  Filled 2021-09-22: qty 14, 7d supply, fill #10
  Filled 2021-10-01: qty 14, 7d supply, fill #11
  Filled 2021-10-10: qty 12, 6d supply, fill #0
  Filled 2021-10-10: qty 12, 6d supply, fill #12

## 2021-05-02 NOTE — Telephone Encounter (Signed)
Pt called and would like a refill on tramadol. She needs either a 30 or 90 day supply

## 2021-05-02 NOTE — Telephone Encounter (Signed)
Patient aware.

## 2021-05-02 NOTE — Telephone Encounter (Signed)
Sent in 30 day

## 2021-05-09 ENCOUNTER — Telehealth: Payer: Self-pay | Admitting: Orthopaedic Surgery

## 2021-05-09 ENCOUNTER — Other Ambulatory Visit: Payer: Self-pay

## 2021-05-09 ENCOUNTER — Telehealth: Payer: Self-pay | Admitting: Internal Medicine

## 2021-05-09 NOTE — Telephone Encounter (Signed)
Patient called advised the Rx for tramadol is still not showing received at the pharmacy. Patient said she spoke with the pharmacy on Monday. The number to contact patient is [phone]

## 2021-05-09 NOTE — Telephone Encounter (Signed)
[facility]

## 2021-05-09 NOTE — Telephone Encounter (Signed)
Copied from [place] [id number]. Topic: General - Other >> May 09, 2021  2:28 PM Robin [name], [place] C wrote: Reason for CRM: Robin [name]'s daughter Luellen Pucker called in to follow up on home health services request. She says that Robin [name] has difficulty walking at times so she would like for Robin [name] to receive Robin [name] services as well as, it is hard for Robin [name] to get around to clean her home. She would like for Robin [name] to receive some in home services to assist with this as well.    Please assist further.   CB: [phone]

## 2021-05-12 ENCOUNTER — Other Ambulatory Visit: Payer: Self-pay

## 2021-05-13 NOTE — Telephone Encounter (Signed)
Call returned to patient. Explained that her daughter, Radonna Ricker, called to inquire about [facility].  The patient said she has not spoken to her daughter about any services. This CM explained what [facility] offers and that it is covered by Medicaid  Explained to the patient that she can think about it and call this CM back if she wants the clinic to place a referral for her.  She said she understood and will think about it.

## 2021-05-13 NOTE — Telephone Encounter (Signed)
Patient called back for Robin [name] waiting to be called again

## 2021-05-13 NOTE — Telephone Encounter (Signed)
Call placed to patient to discuss [facility]. Message left with call back requested to this CM.

## 2021-05-14 ENCOUNTER — Other Ambulatory Visit: Payer: Self-pay | Admitting: Internal Medicine

## 2021-05-15 ENCOUNTER — Other Ambulatory Visit: Payer: Self-pay

## 2021-05-15 ENCOUNTER — Ambulatory Visit (INDEPENDENT_AMBULATORY_CARE_PROVIDER_SITE_OTHER): Payer: Medicaid Other | Admitting: Psychiatry

## 2021-05-15 ENCOUNTER — Encounter (HOSPITAL_COMMUNITY): Payer: Self-pay | Admitting: Psychiatry

## 2021-05-15 VITALS — BP 115/60 | HR 64 | Ht 68.0 in | Wt 315.0 lb

## 2021-05-15 DIAGNOSIS — F341 Dysthymic disorder: Secondary | ICD-10-CM

## 2021-05-15 DIAGNOSIS — F411 Generalized anxiety disorder: Secondary | ICD-10-CM

## 2021-05-15 MED ORDER — DULOXETINE HCL 60 MG PO CPEP: 60.0000 mg | ORAL_CAPSULE | Freq: Every day | ORAL | 2 refills | Status: DC

## 2021-05-15 NOTE — Progress Notes (Signed)
Psychiatric Initial Adult Assessment   Patient Identification: Robin [name] MRN:  [id number] Date of Evaluation:  05/15/2021 Referral Source: Karle Plumber, MD Chief Complaint:  " I have been depressed" Visit Diagnosis: No diagnosis found.  History of Present Illness:  65 year old female seen today for initial psychiatric evaluation. She was referred to outpatient psychiatry by her PCP, Dr. Karle Plumber. She has psychiatric history of depression, adjustment disorder, and anxiety. She is currently managed on hydroxyzine 25 mg three times daily as needed daily and Cymbalta 55m daily. She notes her medications are not effective in managing her psychiatric conditions; she is not sure if she has been taking her hydroxyzine. She also takes Gabapentin and Tramadol for chronic joint pain which are managed by her PCP. In the past, patient has been prescribed Risperdal, amitriptyline, Bupropion for psychiatric medication management of depression and anxiety.    Today, patient is well groomed, pleasant, cooperative, engaged in conversation, and maintaining eye contact. She informed provider that she feels depressed but denies feeling anxious. She feels sad often for a week straight, cries daily, and doesn't want to do the things she used to enjoy. Patient has four living [place]. She recently separated from her husband of 421years due to infidelity, but they still live together. Patient states that her husband broke her heart by cheating on her many times during their marriage, and she wishes he would leave but neither want to leave the house. Patient feels her husband is trying to push her out to replace her with a new partner without any acknowledgement or compensation for the 44 years of her life that she has committed to him. She is financially dependent on his disability income  currently and is working on approval for her own disability. She feels stuck where she is for the time being and is trying to be patient and make the best of her current situation. Patient has been worrying about her husband, children, finances, and home life. Patient endorses chronic throbbing, burning pain due to arthritis. She is electric wheelchair dependent for mobility and states that she has been told she needs to lose weight to have any type of surgery or treatment for her joint pain.    Today, provider conducted a GAD-7 and patient scored a 16. Provider also conducted a PHQ-9 and patient scored a 13. She endorses sleeping approximately 5 to 6 hours a day and wakes up multiple times through the night with trouble falling back asleep. She endorses a fluctuating appetite with fluctuating weight changes. She denies SI, HI, VAH, mania, or paranoia.    Patient is agreeable to increase Cymbalta from 347mto 6032maily, discontinue hydroxyzine 2m52m needed daily. Patient to follow up with her PCP for continued management of Gabapentin and Tramadol. She is agreeable to establishing with a therapist, appointment with JacqGeni Berseduled on October 5th and patient was also provided information about therapy walk-in hours. No other concerns noted at this time.    Associated Signs/Symptoms: Depression Symptoms:  depressed mood, anhedonia, insomnia, psychomotor agitation, fatigue, feelings of worthlessness/guilt, difficulty concentrating, hopelessness, suicidal attempt, anxiety, loss of energy/fatigue, weight loss, weight gain, increased appetite, decreased appetite, (Hypo) Manic Symptoms:  Distractibility, Irritable Mood, Anxiety Symptoms:  Excessive Worry, Psychotic Symptoms:   Denies PTSD Symptoms: NA  Past Psychiatric History: Depression, adjustment disorder, and anxiety  Previous Psychotropic  Medications:  Amitriptyline, hydroxyzine, Risperdal,Cymbalta, Gabapentin, Wellbutrin     Substance Abuse History in the last 12 months:  No.  Consequences of Substance Abuse: NA  Past Medical History:  Past Medical History:  Diagnosis Date   Anxiety disorder    Arthritis    Asthma    Chronic neck pain    Constipation    Depression    Diabetes mellitus without complication (HCC)    Dyspnea    GERD (gastroesophageal reflux disease)    Hiatal hernia    small   Hyperlipidemia    Hypertension    Hypothyroidism    Morbid obesity with BMI of 50.0-59.9, adult ([place])    [place]ki's ring    non critical   Sinusitis    Sleep apnea    CPAP, Sleep study at [place]   Sleep apnea     Past Surgical History:  Procedure Laterality Date   ABDOMINAL HYSTERECTOMY     ABDOMINAL HYSTERECTOMY  02/18/2000   CARDIAC CATHETERIZATION  08/18/2003   normal L main/LAD/L Cfx/RCA (Dr. Adora Fridge)   COLONOSCOPY  2006   Dr. [name], hyperplastic polyps   COLONOSCOPY  08/06/11   abnormal terminal ileum for 10cm, erosions, geographical ulceration. Bx small bowel mucosa with prominent intramucosal lymphoid aggregates, slightly inflammed   COLONOSCOPY WITH PROPOFOL N/A 12/28/2019   Rourk: 4 sessile serrated adenomas removed on the colon.  Next colonoscopy in 3 years.   DIRECT LARYNGOSCOPY  03/15/2012   Procedure: DIRECT LARYNGOSCOPY;  Surgeon: [name], MD;  Location: [place];  Service: ENT;  Laterality: N/A; Dr. Wolicki--:>no foreign body seen. normal esophagus to 40cm   ESOPHAGEAL DILATION  04/17/2015   Procedure: ESOPHAGEAL DILATION;  Surgeon: [name], MD;  [facility];  Service: Endoscopy;;   ESOPHAGOGASTRODUODENOSCOPY  08/23/2008   JYN:WGNF distal esophageal erosions consistent with mild erosive reflux esophagitis, otherwise unremarkable esophagus/ Tiny antral erosions of doubtful clinical significance, otherwise normal stomach, patent pylorus, normal D1 and D2   ESOPHAGOGASTRODUODENOSCOPY  08/06/11   small hh, noncritical Schatzki's ring s/p 105 F    ESOPHAGOGASTRODUODENOSCOPY N/A 04/17/2015   AOZ:HYQMVH s/p dilation   ESOPHAGOGASTRODUODENOSCOPY (EGD) WITH PROPOFOL N/A 01/20/2018   Dr. [name]: Erosive gastropathy, normal-appearing esophagus status post empiric dilation.  Chronic gastritis, no H. pylori.   ESOPHAGOSCOPY  03/15/2012   Procedure: ESOPHAGOSCOPY;  Surgeon: [name], MD;  Location: [place];  Service: ENT;  Laterality: N/A;   KNEE ARTHROSCOPY  04/27/2001   MALONEY DILATION N/A 01/20/2018   Procedure: Venia Minks DILATION;  Surgeon: [name], MD;  [facility];  Service: Endoscopy;  Laterality: N/A;   NM MYOCAR PERF WALL MOTION  2003   negative bruce protocol exercise tress test; EF 68%; intermediate risk study due to evidence of anterior wall ischemia extending from mid-ventricle to apex   PITUITARY SURGERY  06/2012   benign tumor, [facility] at [facility]   POLYPECTOMY  12/28/2019   Procedure: POLYPECTOMY;  Surgeon: [name], MD;  [facility];  Service: Endoscopy;;   RECTOCELE REPAIR     TRANSTHORACIC ECHOCARDIOGRAM  2003   EF normal   VAGINAL PROLAPSE REPAIR     VIDEO BRONCHOSCOPY Bilateral 03/08/2017   Procedure: VIDEO BRONCHOSCOPY WITHOUT FLUORO;  Surgeon: Javier Glazier, MD;  Location: [place];  Service: Cardiopulmonary;  Laterality: Bilateral;    Family Psychiatric History: Brother depression and alcohol use  Family History:  Family History  Problem Relation Age of Onset  Kidney cancer Father    Hypertension Father    Hyperlipidemia Father    Sleep apnea Father    Diabetes Mother    Hypertension Mother    Heart Problems Mother    Hyperlipidemia Mother    Asthma Mother    Sleep apnea Mother    Liver disease Brother    Arthritis Brother    Hypertension Sister    Allergic rhinitis Sister    Stomach cancer Other        aunt   Breast cancer Maternal Grandmother    Kidney disease Maternal Grandfather    Breast cancer Paternal Grandmother    Hypertension Brother     Hyperlipidemia Brother    Hypertension Brother    Hypertension Sister    Hyperlipidemia Sister    Lupus Maternal Aunt    Eczema Daughter    Colon cancer Neg Hx    Immunodeficiency Neg Hx    Urticaria Neg Hx     Social History:   Social History   Socioeconomic History   Marital status: Married    Spouse name: Not on file   Number of children: 4   Years of education: 10   Highest education level: Not on file  Occupational History    Employer: UNEMPLOYED  Tobacco Use   Smoking status: Former    Packs/day: 0.50    Years: 10.00    Pack years: 5.00    Types: Cigarettes    Start date: 07/14/1975    Quit date: 04/04/1993    Years since quitting: 28.1   Smokeless tobacco: Never   Tobacco comments:    quit 20 yrs ago  Vaping Use   Vaping Use: Never used  Substance and Sexual Activity   Alcohol use: No    Alcohol/week: 0.0 standard drinks   Drug use: No   Sexual activity: Yes  Other Topics Concern   Not on file  Social History Narrative   Lives with husband in an apartment on the first floor.  Has 4 children.     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  Trying to get disability.  Formerly worked as a Teacher, early years/pre.  Education: 11th grade.      [facility] (12/23/16):   Originally from [facility]. She was raised in [place]. 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 No pets currently. No bird exposure. She does have mold in her current home in the bathroom, laundry room, & master bedroom.    Social Determinants of Health   Financial Resource Strain: Low Risk    Difficulty of Paying Living Expenses: Not very hard  Food Insecurity: No Food Insecurity   Worried About Charity fundraiser in the Last Year: Never true   Ran Out of Food in the Last Year: Never true  Transportation Needs: Unmet Transportation Needs   Lack of  Transportation (Medical): Yes   Lack of Transportation (Non-Medical): No  Physical Activity: Inactive   Days of Exercise per Week: 0 days   Minutes of Exercise per Session: 0 min  Stress: Stress Concern Present   Feeling of Stress : To some extent  Social Connections: Socially Integrated   Frequency of Communication with Friends and Family: More than three times a week   Frequency of Social Gatherings with Friends and Family: More than  three times a week   Attends Religious Services: More than 4 times per year   Active Member of Clubs or Organizations: Yes   Attends Archivist Meetings: More than 4 times per year   Marital Status: Married    Additional Social History: Patient resides in [place].  She is married however notes that she may be separating soon.  She has poor older children.  Currently she is unemployed and seeking disability.  She denies tobacco, alcohol, or illegal drug use  Allergies:   Allergies  Allergen Reactions   Celexa [Citalopram Hydrobromide] Other (See Comments)    "Made me feel out of it"   Gabapentin Other (See Comments)    Contributed to lower extremity edema   Norvasc [Amlodipine Besylate] Other (See Comments)    Edema in lower extremities   Diflucan [Fluconazole] Rash    Metabolic Disorder Labs: Lab Results  Component Value Date   HGBA1C 6.7 12/02/2020   MPG 114 03/04/2015   No results found for: PROLACTIN Lab Results  Component Value Date   CHOL 141 10/25/2019   TRIG 58 10/25/2019   HDL 58 10/25/2019   CHOLHDL 2.4 10/25/2019   VLDL 13 12/16/2015   LDLCALC 71 10/25/2019   LDLCALC 114 (H) 04/10/2019   Lab Results  Component Value Date   TSH 1.296 01/04/2021    Therapeutic Level Labs: No results found for: LITHIUM No results found for: CBMZ No results found for: VALPROATE  Current Medications: Current Outpatient Medications  Medication Sig Dispense Refill   ACCU-CHEK AVIVA PLUS test strip USE AS DIRECTED 100 each 12    acetaminophen (TYLENOL) 500 MG tablet Take 1,000 mg by mouth 2 (two) times daily.      albuterol (VENTOLIN HFA) 108 (90 Base) MCG/ACT inhaler Inhale 1-2 puffs into the lungs every 6 (six) hours as needed for wheezing or shortness of breath. 18 g 6   aspirin EC 81 MG tablet Take 81 mg by mouth at bedtime.     atorvastatin (LIPITOR) 40 MG tablet Take 1 tablet (40 mg total) by mouth daily. 90 tablet 2   azelastine (ASTELIN) 0.1 % nasal spray 1-2 sprays per nostril 2 times daily as needed (Patient not taking: No sig reported) 30 mL 12   CALCIUM CARBONATE ANTACID PO Take 2 tablets by mouth See admin instructions. Take 2 tablets by mouth every morning, may also take 2 tablets at night as needed for acid reflux (Patient not taking: Reported on 02/27/2021)     celecoxib (CELEBREX) 100 MG capsule TAKE 1 CAPSULE (100 MG TOTAL) BY MOUTH 2 (TWO) TIMES DAILY. (Patient not taking: Reported on 04/16/2021) 180 capsule 2   chlorhexidine (PERIDEX) 0.12 % solution SMARTSIG:By Mouth (Patient not taking: Reported on 04/16/2021)     CINNAMON PO Take 1 capsule by mouth daily. (Patient not taking: No sig reported)     conjugated estrogens (PREMARIN) vaginal cream Place 1 Applicatorful vaginally daily as needed (vaginal dryness/itching). (Patient not taking: No sig reported)     [employer] MAGNESIUM OXIDE 250 MG TABS Take 1 tablet by mouth daily. (Patient not taking: Reported on 04/16/2021)     diclofenac sodium (VOLTAREN) 1 % GEL Apply 2-4 g topically 4 (four) times daily as needed (pain). (Patient not taking: No sig reported)     Diclofenac Sodium 3 % GEL SMARTSIG:1-2 Gram(s) Topical 3-4 Times Daily (Patient not taking: No sig reported)     DULoxetine (CYMBALTA) 30 MG capsule TAKE 1 CAPSULE (30 MG TOTAL) BY MOUTH  DAILY. 90 capsule 1   esomeprazole (NEXIUM) 40 MG capsule Take 1 capsule (40 mg total) by mouth 2 (two) times daily before a meal. 60 capsule 5   famotidine (PEPCID) 20 MG tablet TAKE 1 TABLET (20 MG TOTAL) BY MOUTH 2  (TWO) TIMES DAILY AS NEEDED FOR HEARTBURN OR INDIGESTION. (Patient not taking: No sig reported) 60 tablet 3   fluticasone (FLONASE) 50 MCG/ACT nasal spray Place 1 spray into both nostrils daily. (Patient not taking: No sig reported) 16 g 1   fluticasone (FLOVENT HFA) 220 MCG/ACT inhaler INHALE 2 INHALATIONS TWICE DAILY (Patient not taking: Reported on 04/16/2021) 12 g 12   furosemide (LASIX) 40 MG tablet TAKE 1/2 TO 1 TABLET BY MOUTH DAILY FOR LOWER EXTREMITY SWELLING (Patient not taking: Reported on 04/16/2021) 90 tablet 2   gabapentin (NEURONTIN) 600 MG tablet Take 1 tablet (600 mg total) by mouth 3 (three) times daily. 270 tablet 2   glipiZIDE (GLUCOTROL) 10 MG tablet Take by mouth. (Patient not taking: No sig reported)     glucose blood test strip USE AS INSTRUCTED (Patient not taking: No sig reported)     glucose blood test strip USE AS INSTRUCTED (Patient not taking: No sig reported)     glucose blood test strip USE AS INSTRUCTED (Patient not taking: No sig reported) 100 strip 12   hydrALAZINE (APRESOLINE) 50 MG tablet Take 1 tablet (50 mg total) by mouth 2 (two) times daily. 270 tablet 3   hydrochlorothiazide (HYDRODIURIL) 25 MG tablet Take 1 tablet (25 mg total) by mouth daily. 90 tablet 3   HYDROcodone bit-homatropine (HYCODAN) 5-1.5 MG/5ML syrup hydrocodone-homatropine 5 mg-1.5 mg/5 mL oral syrup (Patient not taking: Reported on 04/16/2021)     hydrOXYzine (ATARAX/VISTARIL) 25 MG tablet Take 25 mg by mouth daily as needed. (Patient not taking: Reported on 04/16/2021)     levothyroxine (SYNTHROID) 88 MCG tablet TAKE 1 TABLET (88 MCG TOTAL) BY MOUTH DAILY. 90 tablet 1   linaclotide (LINZESS) 290 MCG CAPS capsule TAKE 1 CAPSULE BY MOUTH DAILY BEFORE BREAKFAST. 90 capsule 3   losartan (COZAAR) 25 MG tablet Take 1 tablet (25 mg total) by mouth daily. 90 tablet 2   metFORMIN (GLUCOPHAGE) 500 MG tablet 2 tabs PO Q a.m and 1 tab in evenings 270 tablet 1   Metoprolol Tartrate 75 MG TABS TAKE ONE TABLET  BY MOUTH EVERY MORNING and TAKE ONE TABLET BY MOUTH EVERY EVENING 180 tablet 0   montelukast (SINGULAIR) 10 MG tablet TAKE 1 TABLET BY MOUTH AT BEDTIME. 90 tablet 2   Multiple Vitamin (MULTIVITAMIN WITH MINERALS) TABS tablet Take 1 tablet by mouth daily.  (Patient not taking: Reported on 04/16/2021)     NARCAN 4 MG/0.1ML LIQD nasal spray kit  (Patient not taking: Reported on 04/16/2021)     Omega-3 Fatty Acids (FISH OIL) 1000 MG CAPS Take 1,000 mg by mouth at bedtime.  (Patient not taking: No sig reported)     ondansetron (ZOFRAN) 4 MG tablet Take 4 mg by mouth every 8 (eight) hours as needed for nausea or vomiting. (Patient not taking: No sig reported)     OVER THE COUNTER MEDICATION Take 1 capsule by mouth daily. BEET ROOT (Patient not taking: No sig reported)     pioglitazone (ACTOS) 30 MG tablet Take by mouth. (Patient not taking: No sig reported)     Semaglutide (RYBELSUS) 7 MG TABS Take by mouth. (Patient not taking: No sig reported)     simvastatin (ZOCOR) 40 MG tablet  Take by mouth. (Patient not taking: No sig reported)     spironolactone (ALDACTONE) 25 MG tablet Take 1 tablet (25 mg total) by mouth daily. 90 tablet 1   tiZANidine (ZANAFLEX) 2 MG tablet Take 2 mg by mouth daily as needed. (Patient not taking: No sig reported)     tiZANidine (ZANAFLEX) 4 MG tablet Take 4 mg by mouth 2 (two) times daily as needed for muscle spasms. (Patient not taking: No sig reported)     traMADol (ULTRAM) 50 MG tablet Take 1 tablet (50 mg total) by mouth 2 (two) times daily as needed. 60 tablet 2   TRULICITY 3.29 VB/1.6OM SOPN Inject 0.75 mg into the skin once a week. 9 mL 3   valACYclovir (VALTREX) 500 MG tablet Take 500 mg by mouth 2 (two) times daily. (Patient not taking: No sig reported)     vitamin B-12 (CYANOCOBALAMIN) 1000 MCG tablet Take 1,000 mcg by mouth daily. (Patient not taking: No sig reported)     Vitamin D, Ergocalciferol, (DRISDOL) 1.25 MG (50000 UNIT) CAPS capsule Take 1 capsule (50,000 Units  total) by mouth once a week. 12 capsule 0   No current facility-administered medications for this visit.    Musculoskeletal: Strength & Muscle Tone: decreased and Patient is in a wheelchair [place]:  Patient ina wheel chair Patient leans: N/A  Psychiatric Specialty Exam: Review of Systems  There were no vitals taken for this visit.There is no height or weight on file to calculate BMI.  General Appearance: Well Groomed  Eye Contact:  Good  Speech:  Clear and Coherent and Normal Rate  Volume:  Normal  Mood:  Anxious and Depressed  Affect:  Appropriate and Congruent  Thought Process:  Coherent, Goal Directed, and Linear  Orientation:  Full (Time, Place, and Person)  Thought Content:  WDL and Logical  Suicidal Thoughts:  No  Homicidal Thoughts:  No  Memory:  Immediate;   Good Recent;   Good Remote;   Good  Judgement:  Good  Insight:  Good  Psychomotor Activity:  Decreased  Concentration:  Concentration: Good and Attention Span: Good  Recall:  Good  Fund of Knowledge:Good  Language: Good  Akathisia:  No  Handed:  Right  AIMS (if indicated):  not done  Assets:  Communication Skills Desire for Improvement Financial Resources/Insurance Housing Leisure Time Social Support Transportation  ADL's:  Intact  Cognition: WNL  Sleep:  Good   Screenings: GAD-7    Physiological scientist Office Visit from 02/27/2021 in [place] Office Visit from 11/29/2020 in [place] from 05/21/2020 in [facility] Office Visit from 03/07/2020 in [facility] Office Visit from 11/13/2019 in [place]  Total GAD-7 Score _0 [employer]    Flowsheet Row Counselor from 04/30/2021 in [facility] Office Visit from 02/27/2021 in [place] from [facility]/20[facility] Office Visit from 11/29/2020 in [place] Patient Outreach Telephone from 10/15/2020 in [facility]  PHQ-2 Total Score _1 PHQ-9 Total Score 19 15 -- 18 20      Flowsheet Row Counselor from 04/30/2021 in [facility] ED from 01/04/2021 in  [facility] EM[facility]GENCY DEPARTMENT  C-SSRS RISK CATEGORY No Risk No Risk       Assessment and Plan: Patient endorses symptoms of anxiety and depression related to life stressors.  At this time she is agreeable to increasing Cymbalta 30 mg to 60 mg to help manage anxiety and depression.  Patient notes that she is not taking hydroxyzine in a while and would like to discontinue it at this time.  She will continue all other medications as prescribed.  1. Persistent depressive disorder  Increased- DULoxetine (CYMBALTA) 60 MG capsule; Take 1 capsule (60 mg total) by mouth daily.  Dispense: 30 capsule; Refill: 2 - Ambulatory referral to Social Work  2. Generalized anxiety disorder  Increased- DULoxetine (CYMBALTA) 60 MG capsule; Take 1 capsule (60 mg total) by mouth daily.  Dispense: 30 capsule; Refill: 2 - Ambulatory referral to Social Work   Follow-up in 3 months Follow-up with therapy Salley Slaughter, NP 8/11/20229:23 AM

## 2021-05-20 ENCOUNTER — Ambulatory Visit (INDEPENDENT_AMBULATORY_CARE_PROVIDER_SITE_OTHER): Payer: Medicaid Other | Admitting: Bariatrics

## 2021-05-21 ENCOUNTER — Ambulatory Visit: Payer: Medicaid Other | Admitting: Podiatry

## 2021-05-21 ENCOUNTER — Encounter: Payer: Self-pay | Admitting: Podiatry

## 2021-05-21 ENCOUNTER — Other Ambulatory Visit: Payer: Self-pay

## 2021-05-21 DIAGNOSIS — E1142 Type 2 diabetes mellitus with diabetic polyneuropathy: Secondary | ICD-10-CM | POA: Diagnosis not present

## 2021-05-21 DIAGNOSIS — M79674 Pain in right toe(s): Secondary | ICD-10-CM | POA: Diagnosis not present

## 2021-05-21 DIAGNOSIS — B351 Tinea unguium: Secondary | ICD-10-CM

## 2021-05-21 DIAGNOSIS — M79675 Pain in left toe(s): Secondary | ICD-10-CM | POA: Diagnosis not present

## 2021-05-22 ENCOUNTER — Other Ambulatory Visit: Payer: Self-pay

## 2021-05-25 NOTE — Progress Notes (Signed)
  Subjective:  Patient ID: Robin [name], female    DOB: Jul 14, 1956,  MRN: [id number]  65 y.o. female presents with at risk foot care with history of diabetic neuropathy and painful thick toenails that are difficult to trim. Pain interferes with ambulation. Aggravating factors include wearing enclosed shoe gear. Pain is relieved with periodic professional debridement..    Patient's blood sugar was 101 mg/dl today.  PCP: Ladell Pier, MD and last visit was: 02/27/2021.  Review of Systems: Negative except as noted in the HPI.   Allergies  Allergen Reactions   Celexa [Citalopram Hydrobromide] Other (See Comments)    "Made me feel out of it"   Gabapentin Other (See Comments)    Contributed to lower extremity edema   Norvasc [Amlodipine Besylate] Other (See Comments)    Edema in lower extremities   Diflucan [Fluconazole] Rash    Objective:  There were no vitals filed for this visit. Constitutional Patient is a pleasant 65 y.o. African American female morbidly obese in NAD. AAO x 3.  Vascular Capillary refill time to digits immediate b/l. Palpable DP pulse(s) b/l lower extremities Palpable PT pulse(s) b/l lower extremities Pedal hair sparse. Lower extremity skin temperature gradient within normal limits. No pain with calf compression b/l. No cyanosis or clubbing noted.  Neurologic Normal speech. Pt has subjective symptoms of neuropathy. Protective sensation intact 5/5 intact bilaterally with 10g monofilament b/l. Vibratory sensation intact b/l.  Dermatologic Skin warm and supple b/l lower extremities. No open wounds b/l lower extremities. No interdigital macerations b/l lower extremities. Toenails 1-5 b/l elongated, discolored, dystrophic, thickened, crumbly with subungual debris and tenderness to dorsal palpation.  Orthopedic: Normal muscle strength 5/5 to all lower extremity muscle groups bilaterally. No pain crepitus or joint limitation noted with ROM b/l lower extremities. No gross  bony deformities b/l lower extremities.   Hemoglobin A1C Latest Ref Rng & Units 12/02/2020  HGBA1C 0.0 - 7.0 % 6.7  Some recent data might be hidden   Assessment:   1. Pain due to onychomycosis of toenails of both feet   2. Diabetic peripheral neuropathy associated with type 2 diabetes mellitus ([place])    Plan:  Patient was evaluated and treated and all questions answered.  Onychomycosis with pain -Nails palliatively debridement as below. -Educated on self-care  Procedure: Nail Debridement Rationale: Pain Type of Debridement: manual, sharp debridement. Instrumentation: Nail nipper, rotary burr. Number of Nails: 10  -Examined patient. -Continue diabetic foot care principles: inspect feet daily, monitor glucose as recommended by PCP and/or Endocrinologist, and follow prescribed diet per PCP, Endocrinologist and/or dietician. -Patient to continue soft, supportive shoe gear daily. -Toenails 1-5 b/l were debrided in length and girth with sterile nail nippers and dremel without iatrogenic bleeding.  -Patient to report any pedal injuries to medical professional immediately. -Patient/POA to call should there be question/concern in the interim.  Return in about 3 months (around 08/21/2021).  Marzetta Board, DPM

## 2021-05-26 ENCOUNTER — Other Ambulatory Visit: Payer: Self-pay

## 2021-06-02 ENCOUNTER — Encounter: Payer: Self-pay | Admitting: Dietician

## 2021-06-02 ENCOUNTER — Encounter: Payer: Medicaid Other | Attending: Internal Medicine | Admitting: Dietician

## 2021-06-02 ENCOUNTER — Other Ambulatory Visit: Payer: Self-pay

## 2021-06-02 DIAGNOSIS — E119 Type 2 diabetes mellitus without complications: Secondary | ICD-10-CM | POA: Diagnosis present

## 2021-06-02 NOTE — Patient Instructions (Addendum)
Choose a protein with breakfast (egg, yogurt, peanut butter, nuts). Consider a mini meal mid day (protein plus fruit) Continue mindfulness Choose mindfully             Eat slowly             Stop when satisfied             Eat away from distraction such as TV  For stress continue to do puzzles. Continue the chair exercise.  Keep up the great work!

## 2021-06-02 NOTE — Progress Notes (Signed)
Diabetes Self-Management Education  Visit Type: Follow-up  Appt. Start Time: 1105 Appt. End Time: [id number]  06/02/2021  [name], identified by name and date of birth, is a 65 y.o. female with a diagnosis of Diabetes:  .   ASSESSMENT Patient is here today alone.  She is using a motorized wheel chair.  She states that her strength varies from normal to weak. Neuropathy pain has been worse. She can more better since losing weight. She does chair exercises 3-4 times per week for about 20 minutes. She eats less than she was.  Stress eating reduced. Stress varies.  Her husband causes her the most stress. She is checking her blood sugar each morning. This am it was 113 and yesterday 135. She reports recent blood work but does not know her last A1C. Reflux has not been a problem recently. Teeth removed in May and gums have not healed well enough for dentures.  Requires soft foods.  History includes  Type 2 diabetes, neuropathy, HTN, HLD, hypothyroidism Medications inlcude Metformin, Trulicity, Actose, glipizide, Rybelsus, lasix spirolactone, Vitamin B-12, vitamin D, MVI, Linzess, cinnamon A1C 6.7% 11/24/2020   Weight hx: 316 lbs 06/02/2021 335 lbs 02/10/2021 319 lbs 10/2017 350 highest adult weight 175 lbs lowest adult weight Gained weight with pregnancy and continued to gain after last baby and increased stress at that point.  She stated that food was her comfort.  She became less active.  Had increased depression. Asked patient if husband is trying to keep patient from losing weight.  She states that she has asked this question as well but does not feel that he is.   Patient lives with her husband.  He does most of the shopping and cooking.  Patient states that her husband is helpful and tries to stick to a healthy plan often but get off at times.  Patient states that snacking is her problem.  Her husband has stage 2 lung cancer (started in kidney). Increased stress due to relationship  at times. She is working on disability.  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 Patient used to walk but now uses an electric wheel chair due to arthritis.   She states that she needs knee surgery but does not currently qualify due to her weight. She receives steroid shots in her knees weekly. She has been referred to [facility]. She goes to physical therapy weekly. She does the PT exercises and/or armchair exercises daily.  Increased frequency of exercise makes her hurt more. There were no vitals taken for this visit. There is no height or weight on file to calculate BMI.   Diabetes Self-Management Education - 06/02/21 1150       Visit Information   Visit Type Follow-up      Pre-Education Assessment   Patient understands the diabetes disease and treatment process. Demonstrates understanding / competency    Patient understands incorporating nutritional management into lifestyle. Needs Review    Patient undertands incorporating physical activity into lifestyle. Demonstrates understanding / competency    Patient understands using medications safely. Demonstrates understanding / competency    Patient understands monitoring blood glucose, interpreting and using results Demonstrates understanding / competency    Patient understands prevention, detection, and treatment of acute complications. Demonstrates understanding / competency    Patient understands prevention, detection, and treatment of chronic complications. Demonstrates understanding / competency    Patient understands how to develop strategies to address psychosocial issues. Needs Review    Patient understands how  to develop strategies to promote health/change behavior. Needs Review      Complications   How often do you check your blood sugar? 1-2 times/day    Fasting Blood glucose range (mg/dL) 70-129;130-179   115-135     Dietary Intake   Breakfast [place], banana and occasional nectarine, coffee with flavored  creamer    Lunch skips (especially when breakfast is late)    Snack (afternoon) occasional fruit or cookie    Dinner meat, vegetables, rice or potatoes, occasional pie or cookies or fruit    Snack (evening) none    Beverage(s) flavored water, coffee with sweetened creamer, regular soda occasionally      Exercise   Exercise Type Other (comment)   armchair exercises   How many days per week to you exercise? 4    How many minutes per day do you exercise? 20    Total minutes per week of exercise 80      Patient Education   Previous Diabetes Education Yes (please comment)   02/2021   Nutrition management  Meal options for control of blood glucose level and chronic complications.;Food label reading, portion sizes and measuring food.    Medications Reviewed patients medication for diabetes, action, purpose, timing of dose and side effects.    Psychosocial adjustment Worked with patient to identify barriers to care and solutions      Individualized Goals (developed by patient)   Nutrition General guidelines for healthy choices and portions discussed    Physical Activity Exercise 3-5 times per week;30 minutes per day    Medications take my medication as prescribed    Monitoring  test my blood glucose as discussed    Reducing Risk increase portions of healthy fats      Patient Self-Evaluation of Goals - Patient rates self as meeting previously set goals (% of time)   Nutrition >75%    Physical Activity >75%    Medications >75%    Monitoring >75%    Problem Solving >75%    Reducing Risk >75%    Health Coping >75%      Post-Education Assessment   Patient understands the diabetes disease and treatment process. Demonstrates understanding / competency    Patient understands incorporating nutritional management into lifestyle. Needs Review    Patient undertands incorporating physical activity into lifestyle. Demonstrates understanding / competency    Patient understands using medications  safely. Demonstrates understanding / competency    Patient understands monitoring blood glucose, interpreting and using results Demonstrates understanding / competency    Patient understands prevention, detection, and treatment of acute complications. Demonstrates understanding / competency    Patient understands prevention, detection, and treatment of chronic complications. Demonstrates understanding / competency    Patient understands how to develop strategies to address psychosocial issues. Needs Review    Patient understands how to develop strategies to promote health/change behavior. Needs Review      Outcomes   Expected Outcomes Demonstrated interest in learning. Expect positive outcomes    Future DMSE 2 months    Program Status Not Completed      Subsequent Visit   Since your last visit have you continued or begun to take your medications as prescribed? Yes    Since your last visit have you experienced any weight changes? Loss    Weight Loss (lbs) 19             Individualized Plan for Diabetes Self-Management Training:   Learning Objective:  Patient will have a greater understanding  of diabetes self-management. Patient education plan is to attend individual and/or group sessions per assessed needs and concerns.   Plan:   Patient Instructions  Choose a protein with breakfast (egg, yogurt, peanut butter, nuts). Consider a mini meal mid day (protein plus fruit) Continue mindfulness Choose mindfully             Eat slowly             Stop when satisfied             Eat away from distraction such as TV  For stress continue to do puzzles. Continue the chair exercise.  Keep up the great work!  Expected Outcomes:  Demonstrated interest in learning. Expect positive outcomes  Education material provided: Snack sheet  If problems or questions, patient to contact team via:  Phone  Future DSME appointment: 2 months

## 2021-06-03 ENCOUNTER — Ambulatory Visit (INDEPENDENT_AMBULATORY_CARE_PROVIDER_SITE_OTHER): Payer: Medicaid Other | Admitting: Bariatrics

## 2021-06-03 ENCOUNTER — Other Ambulatory Visit: Payer: Self-pay

## 2021-06-03 ENCOUNTER — Encounter (INDEPENDENT_AMBULATORY_CARE_PROVIDER_SITE_OTHER): Payer: Self-pay | Admitting: Bariatrics

## 2021-06-03 VITALS — BP 139/75 | HR 65 | Temp 97.9°F | Ht 68.0 in | Wt 310.0 lb

## 2021-06-03 DIAGNOSIS — E538 Deficiency of other specified B group vitamins: Secondary | ICD-10-CM

## 2021-06-03 DIAGNOSIS — Z6841 Body Mass Index (BMI) 40.0 and over, adult: Secondary | ICD-10-CM

## 2021-06-03 DIAGNOSIS — G4733 Obstructive sleep apnea (adult) (pediatric): Secondary | ICD-10-CM

## 2021-06-03 DIAGNOSIS — Z862 Personal history of diseases of the blood and blood-forming organs and certain disorders involving the immune mechanism: Secondary | ICD-10-CM

## 2021-06-03 DIAGNOSIS — R5383 Other fatigue: Secondary | ICD-10-CM | POA: Diagnosis not present

## 2021-06-03 DIAGNOSIS — K760 Fatty (change of) liver, not elsewhere classified: Secondary | ICD-10-CM

## 2021-06-03 DIAGNOSIS — E7849 Other hyperlipidemia: Secondary | ICD-10-CM

## 2021-06-03 DIAGNOSIS — E559 Vitamin D deficiency, unspecified: Secondary | ICD-10-CM

## 2021-06-03 DIAGNOSIS — E1169 Type 2 diabetes mellitus with other specified complication: Secondary | ICD-10-CM

## 2021-06-03 DIAGNOSIS — Z1331 Encounter for screening for depression: Secondary | ICD-10-CM

## 2021-06-03 DIAGNOSIS — R0602 Shortness of breath: Secondary | ICD-10-CM | POA: Diagnosis not present

## 2021-06-03 DIAGNOSIS — I10 Essential (primary) hypertension: Secondary | ICD-10-CM

## 2021-06-03 DIAGNOSIS — E669 Obesity, unspecified: Secondary | ICD-10-CM

## 2021-06-04 ENCOUNTER — Encounter (INDEPENDENT_AMBULATORY_CARE_PROVIDER_SITE_OTHER): Payer: Self-pay | Admitting: Bariatrics

## 2021-06-04 LAB — FOLATE: Folate: 15.7 ng/mL (ref >3.0)

## 2021-06-04 LAB — VITAMIN D 25 HYDROXY (VIT D DEFICIENCY, FRACTURES): Vit D, 25-Hydroxy: 61.7 ng/mL (ref 30.0–100.0)

## 2021-06-04 LAB — LIPID PANEL
Chol/HDL Ratio: 2.3 ratio (ref 0.0–4.4)
Cholesterol, Total: 121 mg/dL (ref 100–199)
HDL: 52 mg/dL (ref >39)
LDL Chol Calc (NIH): 54 mg/dL (ref 0–99)
Triglycerides: 73 mg/dL (ref 0–149)
VLDL Cholesterol Cal: 15 mg/dL (ref 5–40)

## 2021-06-04 LAB — INSULIN, RANDOM: INSULIN: 48.4 u[IU]/mL — ABNORMAL HIGH (ref 2.6–24.9)

## 2021-06-04 LAB — HEMOGLOBIN A1C
Est. average glucose Bld gHb Est-mCnc: 128 mg/dL
Hgb A1c MFr Bld: 6.1 % — ABNORMAL HIGH (ref 4.8–5.6)

## 2021-06-04 LAB — T3, FREE: T3, Free: 2.6 pg/mL (ref 2.0–4.4)

## 2021-06-04 LAB — VITAMIN B12: Vitamin B-12: 488 pg/mL (ref 232–1245)

## 2021-06-04 NOTE — Progress Notes (Signed)
Dear Dr. Erlinda Hong,   Thank you for referring Robin [name] to our clinic. The following note includes my evaluation and treatment recommendations.  Chief Complaint:   OBESITY Robin [name] (MR# [id number]) is a 65 y.o. female who presents for evaluation and treatment of obesity and related comorbidities. Current BMI is Body mass index is 47.14 kg/m. Robin [name] has been struggling with her weight for many years and has been unsuccessful in either losing weight, maintaining weight loss, or reaching her healthy weight goal.  Robin [name] is currently in the action stage of change and ready to dedicate time achieving and maintaining a healthier weight. Robin [name] is interested in becoming our patient and working on intensive lifestyle modifications including (but not limited to) diet and exercise for weight loss.  Robin [name] struggles with sweets and some carbohydrates.  She states that she is not a "picky eater".  She does not eat lunch.  Robin [name]'s habits were reviewed today and are as follows: Her family eats meals together, she thinks her family will eat healthier with her, she started gaining weight about 6 years ago, her heaviest weight ever was 350 pounds, she craves sweets, she snacks frequently in the evenings, she skips lunch frequently, she is frequently drinking liquids with calories, she frequently makes poor food choices, she frequently eats larger portions than normal, and she struggles with emotional eating.  Depression Screen Robin [name] Food and Mood (modified PHQ-9) score was 15.  Depression screen PHQ 2/9 06/03/2021  Decreased Interest 3  Down, Depressed, Hopeless 2  PHQ - 2 Score 5  Altered sleeping 1  Tired, decreased energy 3  Change in appetite 2  Feeling bad or failure about yourself  0  Trouble concentrating 1  Moving slowly or fidgety/restless 2  Suicidal thoughts 0  PHQ-9 Score 14  Difficult doing work/chores Somewhat difficult  Some encounter information is confidential and  restricted. Go to Review Flowsheets activity to see all data.  Some recent data might be hidden   Subjective:   1. Other fatigue Semiya admits to daytime somnolence and reports waking up still tired. Patent has a history of symptoms of daytime fatigue, morning fatigue, morning headache, and snoring. Robin [name] generally gets 7 hours of sleep per night, and states that she has poor quality sleep. Snoring is present. Apneic episodes are present. Epworth Sleepiness Score is 11.  Occurs with certain activities.  2. SOB (shortness of breath) on exertion Robin [name] notes increasing shortness of breath with exercising and seems to be worsening over time with weight gain. She notes getting out of breath sooner with activity than she used to. This has gotten worse recently. Robin [name] denies shortness of breath at rest or orthopnea.  Occurs with certain activities.  3. Essential hypertension Review: taking medications as instructed, no medication side effects noted, no chest pain on exertion, no dyspnea on exertion, no swelling of ankles.  She is taking HCTZ, Cozaar, metoprolol, and aldactone.   BP Readings from Last 3 Encounters:  06/03/21 139/75  02/27/21 114/71  01/04/21 (!) 147/77   4. OSA (obstructive sleep apnea) She has a CPAP.  She says her sleep is restless.  5. Fatty liver Diagnosed per ultrasound.  No medications.  6. Diabetes mellitus type 2 in obese ([place]) Taking metformin, Rybelsus, and Trulicity.  A1c 6.7.  Lab Results  Component Value Date   HGBA1C 6.1 (H) 06/03/2021   HGBA1C 6.7 12/02/2020   HGBA1C 6.7 (H) 02/15/2020   Lab Results  Component Value Date  LDLCALC 54 06/03/2021   CREATININE 0.74 01/04/2021   Lab Results  Component Value Date   INSULIN 48.4 (H) 06/03/2021   7. Other hyperlipidemia Robin [name] has hyperlipidemia and has been trying to improve her cholesterol levels with intensive lifestyle modification including a low saturated fat diet, exercise and weight loss.  She denies any chest pain, claudication or myalgias.  Taking Lipitor and Omega-3 fatty acids.  Lab Results  Component Value Date   ALT 35 01/04/2021   AST 32 01/04/2021   ALKPHOS 53 01/04/2021   BILITOT 0.6 01/04/2021   Lab Results  Component Value Date   CHOL 121 06/03/2021   HDL 52 06/03/2021   LDLCALC 54 06/03/2021   TRIG 73 06/03/2021   CHOLHDL 2.3 06/03/2021   8. Vitamin D deficiency She is currently taking prescription vitamin D 50,000 IU each week. She denies nausea, vomiting or muscle weakness.  Lab Results  Component Value Date   VD25OH 61.7 06/03/2021   9. B12 deficiency She is taking vitamin B12.  Lab Results  Component Value Date   VITAMINB12 488 06/03/2021   10. Hx of iron deficiency anemia She does not have a history of weight loss surgery.   CBC Latest Ref Rng & Units 01/04/2021 07/27/2020 01/15/2020  WBC 4.0 - 10.5 K/uL 12.0(H) 10.3 8.2  Hemoglobin 12.0 - 15.0 g/dL 12.8 12.8 12.1  Hematocrit 36.0 - 46.0 % 41.0 40.9 37.3  Platelets 150 - 400 K/uL 411(H) 354 337.0   Lab Results  Component Value Date   IRON 31 03/04/2015   TIBC 340 03/04/2015   FERRITIN 34 03/04/2015   Lab Results  Component Value Date   VITAMINB12 488 06/03/2021   11. Screening for depression Robin [name] was screened for depression as part of her new patient workup today.  PHQ-9 is 15.  Assessment/Plan:   1. Other fatigue Robin [name] does feel that her weight is causing her energy to be lower than it should be. Fatigue may be related to obesity, depression or many other causes. Labs will be ordered, and in the meanwhile, Robin [name] will focus on self care including making healthy food choices, increasing physical activity and focusing on stress reduction.  Gradually increase activity.   - EKG 12-Lead  2. SOB (shortness of breath) on exertion Robin [name] does feel that she gets out of breath more easily that she used to when she exercises. Robin [name] shortness of breath appears to be obesity  related and exercise induced. She has agreed to work on weight loss and gradually increase exercise to treat her exercise induced shortness of breath. Will continue to monitor closely.  Gradually increase activity.  - EKG 12-Lead  3. Essential hypertension Robin [name] is working on healthy weight loss and exercise to improve blood pressure control. We will watch for signs of hypotension as she continues her lifestyle modifications.  Continue medications.  4. OSA (obstructive sleep apnea) Intensive lifestyle modifications are the first line treatment for this issue. We discussed several lifestyle modifications today and she will continue to work on diet, exercise and weight loss efforts. We will continue to monitor. Orders and follow up as documented in patient record. Will wear CPAP nightly.  5. Fatty liver Will work on diet and exercise.  - Comprehensive metabolic panel  6. Diabetes mellitus type 2 in obese ([facility]) Good blood sugar control is important to decrease the likelihood of diabetic complications such as nephropathy, neuropathy, limb loss, blindness, coronary artery disease, and death. Intensive lifestyle modification including diet, exercise and weight loss  are the first line of treatment for diabetes. Continue medications.  Check A1c, insulin, and microalbumin today.  - Hemoglobin A1c - Insulin, random - Microalbumin, urine  7. Other hyperlipidemia Cardiovascular risk and specific lipid/LDL goals reviewed.  We discussed several lifestyle modifications today and Robin [name] will continue to work on diet, exercise and weight loss efforts. Orders and follow up as documented in patient record.  Continue Lipitor.     Counseling Intensive lifestyle modifications are the first line treatment for this issue. Dietary changes: Increase soluble fiber. Decrease simple carbohydrates. Exercise changes: Moderate to vigorous-intensity aerobic activity 150 minutes per week if tolerated. Lipid-lowering  medications: see documented in medical record.  - Lipid panel  8. Vitamin D deficiency Low Vitamin D level contributes to fatigue and are associated with obesity, breast, and colon cancer. She agrees to continue to take prescription Vitamin D '@50'$ ,000 IU every week.  Will check vitamin D level today.   - VITAMIN D 25 Hydroxy (Vit-D Deficiency, Fractures)  9. B12 deficiency The diagnosis was reviewed with the patient. Counseling provided today, see below. We will continue to monitor.  Check vitamin B12 level today.  Counseling The body needs vitamin B12: to make red blood cells; to make DNA; and to help the nerves work properly so they can carry messages from the brain to the body.  The main causes of vitamin B12 deficiency include dietary deficiency, digestive diseases, pernicious anemia, and having a surgery in which part of the stomach or small intestine is removed.  Certain medicines can make it harder for the body to absorb vitamin B12. These medicines include: heartburn medications; some antibiotics; some medications used to treat diabetes, gout, and high cholesterol.  In some cases, there are no symptoms of this condition. If the condition leads to anemia or nerve damage, various symptoms can occur, such as weakness or fatigue, shortness of breath, and numbness or tingling in your hands and feet.   Treatment:  May include taking vitamin B12 supplements.  Avoid alcohol.  Eat lots of healthy foods that contain vitamin B12: Beef, pork, chicken, [place], and organ meats, such as liver.  Seafood: This includes clams, rainbow trout, salmon, tuna, and haddock. Eggs.  Cereal and dairy products that are fortified: This means that vitamin B12 has been added to the food.   - Vitamin B12  10. Hx of iron deficiency anemia Check folate and B12 today.  - Folate - Vitamin B12  11. Screening for depression Robin [name] had a positive depression screening. Depression is commonly associated with  obesity and often results in emotional eating behaviors. We will monitor this closely and work on CBT to help improve the non-hunger eating patterns. Referral to Psychology may be required if no improvement is seen as she continues in our clinic.  12. Obesity with a current BMI of 47.2  Robin [name] is currently in the action stage of change and her goal is to continue with weight loss efforts. I recommend Robin [name] begin the structured treatment plan as follows:  She has agreed to the Category 3 Plan.  She will work on meal planning, will stop all sugary drinks, and will have a protein shake for lunch.  Reviewed labs from 01/04/2021, including CMP, BNP, CBC, glucose, TSH, and T4.  Exercise goals: No exercise has been prescribed at this time.   Behavioral modification strategies: increasing lean protein intake, decreasing simple carbohydrates, increasing vegetables, increasing water intake, decreasing eating out, no skipping meals, meal planning and cooking strategies, keeping healthy foods  in the home, and planning for success.  She was informed of the importance of frequent follow-up visits to maximize her success with intensive lifestyle modifications for her multiple health conditions. She was informed we would discuss her lab results at her next visit unless there is a critical issue that needs to be addressed sooner. Robin [name] agreed to keep her next visit at the agreed upon time to discuss these results.  Objective:   Blood pressure 139/75, pulse 65, temperature 97.9 F (36.6 C), height '5\' 8"'$  (1.727 m), weight (!) 310 lb (140.6 kg), SpO2 95 %. Body mass index is 47.14 kg/m.  EKG: Normal sinus rhythm, rate 68 bpm.  Indirect Calorimeter completed today shows a VO2 of 289 and a REE of 1987.  Her calculated basal metabolic rate is [id number] thus her basal metabolic rate is worse than expected.  General: Cooperative, alert, well developed, in no acute distress. HEENT: Conjunctivae and lids  unremarkable. Cardiovascular: Regular rhythm.  Lungs: Normal work of breathing. Neurologic: No focal deficits.   Lab Results  Component Value Date   CREATININE 0.74 01/04/2021   BUN 10 01/04/2021   NA 137 01/04/2021   K 3.5 01/04/2021   CL 100 01/04/2021   CO2 29 01/04/2021   Lab Results  Component Value Date   ALT 35 01/04/2021   AST 32 01/04/2021   ALKPHOS 53 01/04/2021   BILITOT 0.6 01/04/2021   Lab Results  Component Value Date   HGBA1C 6.1 (H) 06/03/2021   HGBA1C 6.7 12/02/2020   HGBA1C 6.7 (H) 02/15/2020   HGBA1C 6.5 (H) 11/13/2019   HGBA1C 5.9 (H) 04/10/2019   Lab Results  Component Value Date   INSULIN 48.4 (H) 06/03/2021   Lab Results  Component Value Date   TSH 1.296 01/04/2021   Lab Results  Component Value Date   CHOL 121 06/03/2021   HDL 52 06/03/2021   LDLCALC 54 06/03/2021   TRIG 73 06/03/2021   CHOLHDL 2.3 06/03/2021   Lab Results  Component Value Date   WBC 12.0 (H) 01/04/2021   HGB 12.8 01/04/2021   HCT 41.0 01/04/2021   MCV 94.5 01/04/2021   PLT 411 (H) 01/04/2021   Lab Results  Component Value Date   IRON 31 03/04/2015   TIBC 340 03/04/2015   FERRITIN 34 03/04/2015   Attestation Statements:   Reviewed by clinician on day of visit: allergies, medications, problem list, medical history, surgical history, family history, social history, and previous encounter notes.  Time spent on this visit including pre-visit chart review and post-visit care was 65 minutes.   I, Water quality scientist, CMA, am acting as Location manager for [employer], DO  I have reviewed the above documentation for accuracy and completeness, and I agree with the above. Jearld Lesch, DO

## 2021-06-10 ENCOUNTER — Other Ambulatory Visit: Payer: Self-pay

## 2021-06-13 ENCOUNTER — Other Ambulatory Visit: Payer: Self-pay | Admitting: Internal Medicine

## 2021-06-13 NOTE — Telephone Encounter (Signed)
Requested medications are due for refill today.  Yes  Requested medications are on the active medications list.  yes  Last refill. 03/19/2021  Future visit scheduled.   yes  Notes to clinic.  Medication not delegated.

## 2021-06-17 ENCOUNTER — Ambulatory Visit (INDEPENDENT_AMBULATORY_CARE_PROVIDER_SITE_OTHER): Payer: Medicaid Other | Admitting: Bariatrics

## 2021-06-20 ENCOUNTER — Encounter: Payer: Self-pay | Admitting: *Deleted

## 2021-06-23 ENCOUNTER — Other Ambulatory Visit: Payer: Self-pay

## 2021-07-03 ENCOUNTER — Other Ambulatory Visit: Payer: Self-pay

## 2021-07-08 ENCOUNTER — Ambulatory Visit (INDEPENDENT_AMBULATORY_CARE_PROVIDER_SITE_OTHER): Payer: Medicaid Other | Admitting: Bariatrics

## 2021-07-08 ENCOUNTER — Other Ambulatory Visit: Payer: Self-pay

## 2021-07-08 VITALS — BP 148/75 | HR 69 | Temp 98.2°F | Ht 68.0 in | Wt 306.0 lb

## 2021-07-08 DIAGNOSIS — F5089 Other specified eating disorder: Secondary | ICD-10-CM

## 2021-07-08 DIAGNOSIS — I10 Essential (primary) hypertension: Secondary | ICD-10-CM

## 2021-07-08 DIAGNOSIS — E1169 Type 2 diabetes mellitus with other specified complication: Secondary | ICD-10-CM | POA: Diagnosis not present

## 2021-07-08 DIAGNOSIS — E669 Obesity, unspecified: Secondary | ICD-10-CM

## 2021-07-08 DIAGNOSIS — Z6841 Body Mass Index (BMI) 40.0 and over, adult: Secondary | ICD-10-CM

## 2021-07-08 MED ORDER — TOPIRAMATE 25 MG PO TABS: 25.0000 mg | ORAL_TABLET | Freq: Two times a day (BID) | ORAL | 0 refills | Status: DC

## 2021-07-08 NOTE — Progress Notes (Signed)
Chief Complaint:   OBESITY Robin [name] is here to discuss her progress with her obesity treatment plan along with follow-up of her obesity related diagnoses. Robin [name] is on the Category 3 Plan and states she is following her eating plan approximately 30% of the time. Robin [name] states she is doing 0 minutes 0 times per week.  Today's visit was #: 3 Starting weight: 310 lbs Starting date: 06/03/2021 Today's weight: 306 lbs Today's date: 07/08/2021 Total lbs lost to date: 4 lbs Total lbs lost since last in-office visit: 4 lbs  Interim History: Robin [name] is down 4 lbs since her first visit. She did have some struggles. She is still struggling with meals.   Subjective:   1. Diabetes mellitus type 2 in obese Robin [name]) Robin [name] is taking Trulicity and Metformin currently. Her Insulin level was 48.4. Her A1C level was 6.1. Her fasting blood sugar was in the range of 120's. She denies lows.  2. Essential hypertension Robin [name] is taking medications as instructed, no medication side effects noted, no chest pain on exertion, no dyspnea on exertion, no swelling of ankles. Her blood pressure is controlled.  3. Other disorder of eating Robin [name] eats sweets all day. She denies seizures or kidney stones.  Assessment/Plan:   1. Diabetes mellitus type 2 in obese Robin [name]) Robin [name] will continue her medications. Good blood sugar control is important to decrease the likelihood of diabetic complications such as nephropathy, neuropathy, limb loss, blindness, coronary artery disease, and death. Intensive lifestyle modification including diet, exercise and weight loss are the first line of treatment for diabetes.   2. Essential hypertension Robin [name] is working on healthy weight loss and exercise to improve blood pressure control. Robin [name] will continue her medications. We will watch for signs of hypotension as she continues her lifestyle modifications.  3. Other disorder of eating Behavior modification techniques were  discussed today to help Robin [name] deal with her emotional/non-hunger eating behaviors.  We will refill Robin 25 mg twice daily for 1 month with no refills. Orders and follow up as documented in patient record.    - topiramate (Robin) 25 MG [name]; Take 1 [name] (25 mg total) by mouth 2 (two) times daily.  Dispense: 60 [name]; Refill: 0  4. Obesity with a current BMI of 46.5 Robin [name] is currently in the action stage of change. As such, her goal is to continue with weight loss efforts. She has agreed to the Category 3 Plan.   Robin [name] will continue meal planning and intentional eating. We will check labs from 06/03/2021.  Exercise goals: No exercise has been prescribed at this time.  Behavioral modification strategies: increasing lean protein intake, decreasing simple carbohydrates, increasing vegetables, increasing water intake, decreasing eating out, no skipping meals, meal planning and cooking strategies, keeping healthy foods in the home, and planning for success.  Robin [name] has agreed to follow-up with our clinic in 2 weeks. She was informed of the importance of frequent follow-up visits to maximize her success with intensive lifestyle modifications for her multiple health conditions.   Objective:   Blood pressure (!) 148/75, pulse 69, temperature 98.2 F (36.8 C), height 5\' 8"  (1.727 m), weight (!) 306 lb (138.8 kg), SpO2 96 %. Body mass index is 46.53 kg/m.  General: Cooperative, alert, well developed, in no acute distress. HEENT: Conjunctivae and lids unremarkable. Cardiovascular: Regular rhythm.  Lungs: Normal work of breathing. Neurologic: No focal deficits.   Lab Results  Component Value Date   CREATININE 0.74 01/04/2021   BUN 10 01/04/2021  NA 137 01/04/2021   K 3.5 01/04/2021   CL 100 01/04/2021   CO2 29 01/04/2021   Lab Results  Component Value Date   ALT 35 01/04/2021   AST 32 01/04/2021   ALKPHOS 53 01/04/2021   BILITOT 0.6 01/04/2021   Lab Results  Component  Value Date   HGBA1C 6.1 (H) 06/03/2021   HGBA1C 6.7 12/02/2020   HGBA1C 6.7 (H) 02/15/2020   HGBA1C 6.5 (H) 11/13/2019   HGBA1C 5.9 (H) 04/10/2019   Lab Results  Component Value Date   INSULIN 48.4 (H) 06/03/2021   Lab Results  Component Value Date   TSH 1.296 01/04/2021   Lab Results  Component Value Date   CHOL 121 06/03/2021   HDL 52 06/03/2021   LDLCALC 54 06/03/2021   TRIG 73 06/03/2021   CHOLHDL 2.3 06/03/2021   Lab Results  Component Value Date   VD25OH 61.7 06/03/2021   Lab Results  Component Value Date   WBC 12.0 (H) 01/04/2021   HGB 12.8 01/04/2021   HCT 41.0 01/04/2021   MCV 94.5 01/04/2021   PLT 411 (H) 01/04/2021   Lab Results  Component Value Date   IRON 31 03/04/2015   TIBC 340 03/04/2015   FERRITIN 34 03/04/2015   Attestation Statements:   Reviewed by clinician on day of visit: allergies, medications, problem list, medical history, surgical history, family history, social history, and previous encounter notes.  Time spent on visit including pre-visit chart review and post-visit care and charting was 30 minutes.   I, Robin [name], Robin [name], am acting as Location manager for [employer], DO.   I have reviewed the above documentation for accuracy and completeness, and I agree with the above. Robin Lesch, DO

## 2021-07-09 ENCOUNTER — Encounter (INDEPENDENT_AMBULATORY_CARE_PROVIDER_SITE_OTHER): Payer: Self-pay | Admitting: Bariatrics

## 2021-07-09 ENCOUNTER — Ambulatory Visit (HOSPITAL_COMMUNITY): Payer: Medicaid Other | Admitting: Licensed Clinical Social Worker

## 2021-07-10 ENCOUNTER — Other Ambulatory Visit: Payer: Self-pay

## 2021-07-10 ENCOUNTER — Ambulatory Visit: Payer: Medicaid Other | Attending: Internal Medicine | Admitting: Internal Medicine

## 2021-07-10 ENCOUNTER — Encounter: Payer: Self-pay | Admitting: Internal Medicine

## 2021-07-10 DIAGNOSIS — Z7985 Long-term (current) use of injectable non-insulin antidiabetic drugs: Secondary | ICD-10-CM | POA: Diagnosis not present

## 2021-07-10 DIAGNOSIS — Z883 Allergy status to other anti-infective agents status: Secondary | ICD-10-CM | POA: Insufficient documentation

## 2021-07-10 DIAGNOSIS — Z7901 Long term (current) use of anticoagulants: Secondary | ICD-10-CM | POA: Diagnosis not present

## 2021-07-10 DIAGNOSIS — Z9989 Dependence on other enabling machines and devices: Secondary | ICD-10-CM

## 2021-07-10 DIAGNOSIS — Z79899 Other long term (current) drug therapy: Secondary | ICD-10-CM | POA: Diagnosis not present

## 2021-07-10 DIAGNOSIS — Z23 Encounter for immunization: Secondary | ICD-10-CM | POA: Insufficient documentation

## 2021-07-10 DIAGNOSIS — Z6841 Body Mass Index (BMI) 40.0 and over, adult: Secondary | ICD-10-CM | POA: Diagnosis not present

## 2021-07-10 DIAGNOSIS — Z7982 Long term (current) use of aspirin: Secondary | ICD-10-CM | POA: Diagnosis not present

## 2021-07-10 DIAGNOSIS — Z8249 Family history of ischemic heart disease and other diseases of the circulatory system: Secondary | ICD-10-CM | POA: Diagnosis not present

## 2021-07-10 DIAGNOSIS — E1169 Type 2 diabetes mellitus with other specified complication: Secondary | ICD-10-CM | POA: Diagnosis not present

## 2021-07-10 DIAGNOSIS — K219 Gastro-esophageal reflux disease without esophagitis: Secondary | ICD-10-CM | POA: Diagnosis not present

## 2021-07-10 DIAGNOSIS — Z87891 Personal history of nicotine dependence: Secondary | ICD-10-CM | POA: Insufficient documentation

## 2021-07-10 DIAGNOSIS — Z86010 Personal history of colonic polyps: Secondary | ICD-10-CM | POA: Diagnosis not present

## 2021-07-10 DIAGNOSIS — E039 Hypothyroidism, unspecified: Secondary | ICD-10-CM | POA: Diagnosis not present

## 2021-07-10 DIAGNOSIS — Z888 Allergy status to other drugs, medicaments and biological substances status: Secondary | ICD-10-CM | POA: Diagnosis not present

## 2021-07-10 DIAGNOSIS — M797 Fibromyalgia: Secondary | ICD-10-CM | POA: Insufficient documentation

## 2021-07-10 DIAGNOSIS — Z560 Unemployment, unspecified: Secondary | ICD-10-CM | POA: Insufficient documentation

## 2021-07-10 DIAGNOSIS — F333 Major depressive disorder, recurrent, severe with psychotic symptoms: Secondary | ICD-10-CM | POA: Insufficient documentation

## 2021-07-10 DIAGNOSIS — Z791 Long term (current) use of non-steroidal anti-inflammatories (NSAID): Secondary | ICD-10-CM | POA: Diagnosis not present

## 2021-07-10 DIAGNOSIS — I10 Essential (primary) hypertension: Secondary | ICD-10-CM | POA: Diagnosis present

## 2021-07-10 DIAGNOSIS — Z7984 Long term (current) use of oral hypoglycemic drugs: Secondary | ICD-10-CM | POA: Insufficient documentation

## 2021-07-10 DIAGNOSIS — E1142 Type 2 diabetes mellitus with diabetic polyneuropathy: Secondary | ICD-10-CM | POA: Insufficient documentation

## 2021-07-10 DIAGNOSIS — I152 Hypertension secondary to endocrine disorders: Secondary | ICD-10-CM

## 2021-07-10 DIAGNOSIS — E1159 Type 2 diabetes mellitus with other circulatory complications: Secondary | ICD-10-CM | POA: Diagnosis not present

## 2021-07-10 DIAGNOSIS — Z86018 Personal history of other benign neoplasm: Secondary | ICD-10-CM

## 2021-07-10 DIAGNOSIS — G4733 Obstructive sleep apnea (adult) (pediatric): Secondary | ICD-10-CM | POA: Insufficient documentation

## 2021-07-10 MED ORDER — GABAPENTIN 600 MG PO TABS: ORAL_TABLET | ORAL | 2 refills | Status: DC

## 2021-07-10 NOTE — Progress Notes (Signed)
Patient ID: Robin [name], female    DOB: Feb 11, 1956  MRN: [id number]  CC: Hypertension and Prediabetes   Subjective: Robin [name] is a 66 y.o. female who presents for chronic ds management Her concerns today include:  Patient with history of HTN, DM, OSA, fibromyalgia, depression, obesity,  GERD, hypothyroid, OA of multiple js (hands, knees, shoulders, hips), pituitary adenoma status post resection.  Does not have meds with her and has some difficulty recognizing the names of some of the medications when I try to do medication reconciliation with her.  She was advised on last visit to bring her medicines with her.  DM/Obesity:  Lab Results  Component Value Date   HGBA1C 6.1 (H) 06/03/2021  down 29 lbs since last visit with me in May.  She now sees medical weight management.  Topamax recently added when she was seen 2 days ago by Dr. Owens Shark. Checks BS once a day in a.m before BF.  Range 89-120 Still has burn in legs and feet. Very bothersome to herTaking the Gabapentin 600 mg TID and Cymbalta.  Also on Tramadol through her ortho.  Symptoms worse at nights  HTN:  compliant with meds and salt restriction.  No lower extremity edema.  OSA: did mask desensitization as ordered on last visit.  Given mask that fits better.  Told when she went in that her pressure setting was too high and this was contributing to the problem that she was having tolerating the CPAP.  She called her supplier and was told she would need order from doctor to decrease pressure setting -still not using CPAP consistently for this reason.  Does not recall her pressure setting or her supplier.   Requests appointment to see gastroenterology for ongoing issues with acid reflux.  She use to see Dr. [name] in [place] but would like to see someone here locally as she has transportation issues.  Reports compliance with Nexium.  Hx of  pituitary adenoma s/p resection.  Saw neurosurgeon Dr. Maree Erie in f/u 2 yrs ago.  He  reviewed the MRI that she had done at that time and it showed stable to slightly smaller pituitary lesion.  He recommended follow-up with him in 5 years with repeat MRI at that time.  Patient is not reporting any symptoms of headaches or blurred vision at this time.   Patient Active Problem List   Diagnosis Date Noted   Adjustment disorder with mixed disturbance of emotions and conduct 07/28/2020   Polyp of descending colon 02/15/2020   Pituitary tumor 08/21/2019   Primary osteoarthritis of both hips 08/21/2019   Primary osteoarthritis of both knees 08/21/2019   Pain in left shoulder 08/21/2019   Morbid obesity with BMI of 60.0-69.9, adult ([place]) 07/17/2019   Pedal edema 07/17/2019   Multilevel degenerative disc disease 10/31/2018   Abdominal pain 10/28/2018   Change in bowel function 10/28/2018   Severe episode of recurrent major depressive disorder, with psychotic features ([place]) 05/26/2018   Gastritis and gastroduodenitis 04/26/2018   Atrophic vaginitis 04/01/2018   Diabetes mellitus ([place]) 03/31/2018   Cough, persistent 02/08/2018   Dysphagia 12/14/2017   Generalized OA 07/30/2017   Urinary incontinence 07/23/2017   Early satiety 06/16/2017   Hx of iron deficiency anemia 05/28/2017   Throat pain in adult 04/05/2017   Prediabetes 02/04/2017   Dyspnea on exertion 12/23/2016   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 12/23/2016   Hemoptysis 12/23/2016   Fibromyalgia 08/18/2016   Chronic lymphocytic thyroiditis 08/18/2016  Depression 04/01/2016   OSA (obstructive sleep apnea) 04/01/2016   Essential hypertension 12/09/2015   Hypothyroidism 12/09/2015   Morbid obesity due to excess calories ([place]) 12/09/2015   Constipation 05/27/2015   Fatty liver 05/27/2015   Abdominal pain, chronic, epigastric 07/04/2012   Anxiety 06/09/2012   History of gastroesophageal reflux (GERD) 06/09/2012   Hyperlipidemia 06/09/2012   Refusal of blood  transfusions as patient is Jehovah's Witness 06/09/2012   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      albuterol (VENTOLIN HFA) 108 (90 Base) MCG/ACT inhaler Inhale 1-2 puffs into the lungs every 6 (six) hours as needed for wheezing or shortness of breath. 18 g 6   aspirin EC 81 MG tablet Take 81 mg by mouth at bedtime.     atorvastatin (LIPITOR) 40 MG tablet Take 1 tablet (40 mg total) by mouth daily. 90 tablet 2   azelastine (ASTELIN) 0.1 % nasal spray 1-2 sprays per nostril 2 times daily as needed 30 mL 12   CALCIUM CARBONATE ANTACID PO Take 2 tablets by mouth See admin instructions. Take 2 tablets by mouth every morning, may also take 2 tablets at night as needed for acid reflux (Patient not taking: No sig reported)     celecoxib (CELEBREX) 100 MG capsule TAKE 1 CAPSULE (100 MG TOTAL) BY MOUTH 2 (TWO) TIMES DAILY. 180 capsule 2   CINNAMON PO Take 1 capsule by mouth daily. (Patient not taking: No sig reported)     conjugated estrogens (PREMARIN) vaginal cream Place 1 Applicatorful vaginally daily as needed (vaginal dryness/itching). (Patient not taking: No sig reported)     [employer] MAGNESIUM OXIDE 250 MG TABS Take 1 tablet by mouth daily. (Patient not taking: No sig reported)     DULoxetine (CYMBALTA) 60 MG capsule Take 1 capsule (60 mg total) by mouth daily. 30 capsule 2   esomeprazole (NEXIUM) 40 MG capsule Take 1 capsule (40 mg total) by mouth 2 (two) times daily before a meal. 60 capsule 5   fluticasone (FLONASE) 50 MCG/ACT nasal spray Place 1 spray into both nostrils daily. (Patient not taking: Reported on 06/03/2021) 16 g 1   fluticasone (FLOVENT HFA) 220 MCG/ACT  inhaler INHALE 2 INHALATIONS TWICE DAILY (Patient not taking: Reported on 06/03/2021) 12 g 12   furosemide (LASIX) 40 MG tablet TAKE 1/2 TO 1 TABLET BY MOUTH DAILY FOR LOWER EXTREMITY SWELLING (Patient not taking: Reported on 06/03/2021) 90 tablet 2   gabapentin (NEURONTIN) 600 MG tablet Take 1 tablet (600 mg total) by mouth 3 (three) times daily. 270 tablet 2   glucose blood test strip USE AS INSTRUCTED (Patient not taking: No sig reported)     glucose blood test strip USE AS INSTRUCTED (Patient not taking: No sig reported)     hydrALAZINE (APRESOLINE) 50 MG tablet Take 1 tablet (50 mg total) by mouth 2 (two) times daily. 270 tablet 3   hydrochlorothiazide (HYDRODIURIL) 25 MG tablet Take 1 tablet (25 mg total) by mouth daily. 90 tablet 3   hydrOXYzine (ATARAX/VISTARIL) 25 MG tablet Take 25 mg by mouth daily as needed. (Patient not taking: No sig reported)     levothyroxine (SYNTHROID)  88 MCG tablet TAKE 1 TABLET (88 MCG TOTAL) BY MOUTH DAILY. 90 tablet 1   linaclotide (LINZESS) 290 MCG CAPS capsule TAKE 1 CAPSULE BY MOUTH DAILY BEFORE BREAKFAST. 90 capsule 3   losartan (COZAAR) 25 MG tablet Take 1 tablet (25 mg total) by mouth daily. 90 tablet 2   metFORMIN (GLUCOPHAGE) 500 MG tablet 2 tabs PO Q a.m and 1 tab in evenings 270 tablet 1   Metoprolol Tartrate 75 MG TABS TAKE ONE TABLET BY MOUTH EVERY MORNING and TAKE ONE TABLET BY MOUTH EVERY EVENING 180 tablet 0   montelukast (SINGULAIR) 10 MG tablet TAKE 1 TABLET BY MOUTH AT BEDTIME. 90 tablet 2   Multiple Vitamin (MULTIVITAMIN WITH MINERALS) TABS tablet Take 1 tablet by mouth daily.  (Patient not taking: No sig reported)     NARCAN 4 MG/0.1ML LIQD nasal spray kit  (Patient not taking: No sig reported)     Omega-3 Fatty Acids (FISH OIL) 1000 MG CAPS Take 1,000 mg by mouth at bedtime.  (Patient not taking: No sig reported)     spironolactone (ALDACTONE) 25 MG tablet Take 1 tablet (25 mg total) by mouth daily. 90 tablet 1   topiramate (TOPAMAX) 25 MG  tablet Take 1 tablet (25 mg total) by mouth 2 (two) times daily. 60 tablet 0   traMADol (ULTRAM) 50 MG tablet Take 1 tablet (50 mg total) by mouth 2 (two) times daily as needed. 60 tablet 2   TRULICITY 9.21 JH/4.1DE SOPN Inject 0.75 mg into the skin once a week. 9 mL 3   vitamin B-12 (CYANOCOBALAMIN) 1000 MCG tablet Take 1,000 mcg by mouth daily. (Patient not taking: No sig reported)     Vitamin D, Ergocalciferol, (DRISDOL) 1.25 MG (50000 UNIT) CAPS capsule Take 1 capsule (50,000 Units total) by mouth once a week. 12 capsule 0   [DISCONTINUED] amitriptyline (ELAVIL) 50 MG tablet TAKE 1 TABLET BY MOUTH AT BEDTIME. 90 tablet 0   [DISCONTINUED] buPROPion (WELLBUTRIN XL) 300 MG 24 hr tablet TAKE 1 TABLET (300 MG TOTAL) BY MOUTH EVERY MORNING. (Patient taking differently: Take 300 mg by mouth daily.) 90 tablet 2   No current facility-administered medications on file prior to visit.    Allergies  Allergen Reactions   Celexa [Citalopram Hydrobromide] Other (See Comments)    "Made me feel out of it"   Gabapentin Other (See Comments)    Contributed to lower extremity edema   Norvasc [Amlodipine Besylate] Other (See Comments)    Edema in lower extremities   Diflucan [Fluconazole] Rash    Social History   Socioeconomic History   Marital status: Married    Spouse name: Not on file   Number of children: 4   Years of education: 10   Highest education level: Not on file  Occupational History    Employer: UNEMPLOYED  Tobacco Use   Smoking status: Former    Packs/day: 0.50    Years: 10.00    Pack years: 5.00    Types: Cigarettes    Start date: 07/14/1975    Quit date: 04/04/1993    Years since quitting: 28.2   Smokeless tobacco: Never   Tobacco comments:    quit 20 yrs ago  Vaping Use   Vaping Use: Never used  Substance and Sexual Activity   Alcohol use: No    Alcohol/week: 0.0 standard drinks   Drug use: No   Sexual activity: Yes  Other Topics Concern   Not on file  Social History  Narrative  Lives with husband in an apartment on the first floor.  Has 4 children.     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  Trying to get disability.  Formerly worked as a Teacher, early years/pre.  Education: 11th grade.      [facility] (12/23/16):   Originally from [facility]. She was raised in [place]. 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 No pets currently. No bird exposure. She does have mold in her current home in the bathroom, laundry room, & master bedroom.    Social Determinants of Health   Financial Resource Strain: Low Risk    Difficulty of Paying Living Expenses: Not very hard  Food Insecurity: No Food Insecurity   Worried About Charity fundraiser in the Last Year: Never true   Ran Out of Food in the Last Year: Never true  Transportation Needs: Unmet Transportation Needs   Lack of Transportation (Medical): Yes   Lack of Transportation (Non-Medical): No  Physical Activity: Inactive   Days of Exercise per Week: 0 days   Minutes of Exercise per Session: 0 min  Stress: Stress Concern Present   Feeling of Stress : To some extent  Social Connections: Socially Integrated   Frequency of Communication with Friends and Family: More than three times a week   Frequency of Social Gatherings with Friends and Family: More than three times a week   Attends Religious Services: More than 4 times per year   Active Member of Clubs or Organizations: Yes   Attends Music therapist: More than 4 times per year   Marital Status: Married  Human resources officer Violence: Not At Risk   Fear of Current or Ex-Partner: No   Emotionally Abused: No   Physically Abused: No   Sexually Abused: No    Family History  Problem Relation Age of Onset   Diabetes Mother    Hypertension Mother    Heart Problems Mother    Hyperlipidemia Mother     Asthma Mother    Sleep apnea Mother    Obesity Mother    Diabetes Father    Kidney cancer Father    Hypertension Father    Hyperlipidemia Father    Sleep apnea Father    Hypertension Sister    Allergic rhinitis Sister    Hypertension Sister    Hyperlipidemia Sister    Liver disease Brother    Arthritis Brother    Hypertension Brother    Hyperlipidemia Brother    Hypertension Brother    Breast cancer Maternal Grandmother    Kidney disease Maternal Grandfather    Breast cancer Paternal Grandmother    Eczema Daughter    Lupus Maternal Aunt    Stomach cancer Other        aunt   Colon cancer Neg Hx    Immunodeficiency Neg Hx    Urticaria Neg Hx     Past Surgical History:  Procedure Laterality Date   ABDOMINAL HYSTERECTOMY  02/18/2000   CARDIAC CATHETERIZATION  08/18/2003   normal L main/LAD/L Cfx/RCA (Dr. Adora Fridge)   COLONOSCOPY  2006   Dr. [name], hyperplastic polyps   COLONOSCOPY  08/06/2011   abnormal terminal ileum for 10cm, erosions, geographical ulceration. Bx small bowel mucosa with prominent intramucosal lymphoid aggregates, slightly inflammed   COLONOSCOPY WITH PROPOFOL N/A  12/28/2019   Rourk: 4 sessile serrated adenomas removed on the colon.  Next colonoscopy in 3 years.   DIRECT LARYNGOSCOPY  03/15/2012   Procedure: DIRECT LARYNGOSCOPY;  Surgeon: [name], MD;  Location: [facility];  Service: ENT;  Laterality: N/A; Dr. Wolicki--:>no foreign body seen. normal esophagus to 40cm   ESOPHAGEAL DILATION  04/17/2015   Procedure: ESOPHAGEAL DILATION;  Surgeon: [name], MD;  [facility];  Service: Endoscopy;;   ESOPHAGOGASTRODUODENOSCOPY  08/23/2008   PPJ:KDTO distal esophageal erosions consistent with mild erosive reflux esophagitis, otherwise unremarkable esophagus/ Tiny antral erosions of doubtful clinical significance, otherwise normal stomach, patent pylorus, normal D1 and D2   ESOPHAGOGASTRODUODENOSCOPY  08/06/2011   small hh, noncritical  Schatzki's ring s/p 80 F   ESOPHAGOGASTRODUODENOSCOPY N/A 04/17/2015   IZT:[id number] s/p dilation   ESOPHAGOGASTRODUODENOSCOPY (EGD) WITH PROPOFOL N/A 01/20/2018   Dr. [name]: Erosive gastropathy, normal-appearing esophagus status post empiric dilation.  Chronic gastritis, no H. pylori.   ESOPHAGOSCOPY  03/15/2012   Procedure: ESOPHAGOSCOPY;  Surgeon: [name], MD;  Location: [facility];  Service: ENT;  Laterality: N/A;   KNEE ARTHROSCOPY  04/27/2001   MALONEY DILATION N/A 01/20/2018   Procedure: Venia Minks DILATION;  Surgeon: [name], MD;  [facility];  Service: Endoscopy;  Laterality: N/A;   NM MYOCAR PERF WALL MOTION  2003   negative bruce protocol exercise tress test; EF 68%; intermediate risk study due to evidence of anterior wall ischemia extending from mid-ventricle to apex   PITUITARY SURGERY  06/2012   benign tumor, surgeon at [facility]   POLYPECTOMY  12/28/2019   Procedure: POLYPECTOMY;  Surgeon: [name], MD;  [facility];  Service: Endoscopy;;   RECTOCELE REPAIR     TRANSTHORACIC ECHOCARDIOGRAM  2003   EF normal   VAGINAL PROLAPSE REPAIR     VIDEO BRONCHOSCOPY Bilateral 03/08/2017   Procedure: VIDEO BRONCHOSCOPY WITHOUT FLUORO;  Surgeon: Javier Glazier, MD;  Location: [facility];  Service: Cardiopulmonary;  Laterality: Bilateral;    ROS: Review of Systems Negative except as stated above  PHYSICAL EXAM: BP 119/68   Pulse (!) 56   Resp 16   SpO2 95%   Wt Readings from Last 3 Encounters:  07/08/21 (!) 306 lb (138.8 kg)  06/03/21 (!) 310 lb (140.6 kg)  06/02/21 (!) 316 lb (143.3 kg)    Physical Exam  General appearance - alert, well appearing, morbidly obese older African-American female and in no distress.  Patient is in mobility chair Mental status - normal mood, behavior, speech, dress, motor activity, and thought processes.  Slightly forgetful about some things. Eyes - pupils equal and reactive, extraocular eye movements  intact Mouth - mucous membranes moist, pharynx normal without lesions Neck - supple, no significant adenopathy Chest - clear to auscultation, no wheezes, rales or rhonchi, symmetric air entry Heart - normal rate, regular rhythm, normal S1, S2, no murmurs, rubs, clicks or gallops Extremities - peripheral pulses normal, no pedal edema, no clubbing or cyanosis   CMP Latest Ref Rng & Units 01/04/2021 07/27/2020 12/26/2019  Glucose 70 - 99 mg/dL 133(H) 176(H) 165(H)  BUN 8 - 23 mg/dL _0 Creatinine 0.44 - 1.00 mg/dL 0.74 0.91 0.76  Sodium 135 - 145 mmol/L 137 139 137  Potassium 3.5 - 5.1 mmol/L 3.5 3.6 4.3  Chloride 98 - 111 mmol/L 100 98 101  CO2 22 - 32 mmol/L _1 Calcium 8.9 - 10.3 mg/dL 9.6 10.4(H) 9.2  Total Protein 6.5 - 8.1 g/dL 7.9 7.8 -  Total Bilirubin 0.3 - 1.2 mg/dL 0.6 1.1 -  Alkaline Phos 38 - 126 U/L 53 71 -  AST 15 - 41 U/L 32 45(H) -  ALT 0 - 44 U/L 35 56(H) -   Lipid Panel     Component Value Date/Time   CHOL 121 06/03/2021 0833   TRIG 73 06/03/2021 0833   HDL 52 06/03/2021 0833   CHOLHDL 2.3 06/03/2021 0833   CHOLHDL 2.3 12/16/2015 0950   VLDL 13 12/16/2015 0950   LDLCALC 54 06/03/2021 0833    CBC    Component Value Date/Time   WBC 12.0 (H) 01/04/2021 1555   RBC 4.34 01/04/2021 1555   HGB 12.8 01/04/2021 1555   HGB 12.0 01/20/2017 1001   HCT 41.0 01/04/2021 1555   HCT 38.2 01/20/2017 1001   HCT 41 06/25/2011 1053   PLT 411 (H) 01/04/2021 1555   PLT 356 01/20/2017 1001   MCV 94.5 01/04/2021 1555   MCV 90 01/20/2017 1001   MCV 91.1 06/25/2011 1053   MCH 29.5 01/04/2021 1555   MCHC 31.2 01/04/2021 1555   RDW 13.0 01/04/2021 1555   RDW 13.1 01/20/2017 1001   LYMPHSABS 4.6 (H) 01/04/2021 1555   LYMPHSABS 3.4 (H) 01/20/2017 1001   MONOABS 1.6 (H) 01/04/2021 1555   EOSABS 0.1 01/04/2021 1555   EOSABS 0.1 01/20/2017 1001   BASOSABS 0.1 01/04/2021 1555   BASOSABS 0.0 01/20/2017 1001    ASSESSMENT AND PLAN:  1. Type 2 diabetes mellitus  with morbid obesity ([place]) Commended her on weight loss.  Encouraged her to continue trying to eat smaller portion sizes.  She will continue follow-up with medical weight management. Continue Trulicity and metformin. 2. Diabetic peripheral neuropathy ([facility]) Very bothersome to her.  I recommend increasing the evening dose of gabapentin to 900 mg.  She will continue to take 600 mg in the morning and in the afternoons.  3. Hypertension associated with diabetes ([place]) At goal.  Continue current medications and low-salt diet.  4. OSA on CPAP Advised patient to call me back with the name of her medical supplier then I can send orders to have the pressure on the CPAP reduced.  Advised compliance with the CPAP so that her insurance will continue to pay for it.  5. History of pituitary adenoma Stable.  She will plan to follow-up with the neurosurgeon again in 3 years.  6. Gastroesophageal reflux disease without esophagitis Continue Nexium.  Referral submitted to gastroenterology per her request.  GERD precautions discussed and encouraged.  7. Need for immunization against influenza - Flu Vaccine QUAD 18moIM (Fluarix, Fluzone & Alfiuria Quad PF)  8. Need for shingles vaccine Follow-up with clinical pharmacist in 2 weeks for her second shingles shot.   Patient was given the opportunity to ask questions.  Patient verbalized understanding of the plan and was able to repeat key elements of the plan.   Orders Placed This Encounter  Procedures   Flu Vaccine QUAD 638moM (Fluarix, Fluzone & Alfiuria Quad PF)     Requested Prescriptions    No prescriptions requested or ordered in this encounter    Return in about 4 months (around 11/10/2021) [facility].  DeKarle PlumberMD, FACP

## 2021-07-10 NOTE — Patient Instructions (Addendum)
Please bring all of your medications with you on your next visit. Increase gabapentin to 600 mg in the morning, 600 mg in the afternoon and 900 mg at nights.  I have sent an updated prescription to your pharmacy.  Please call us back with the name of your medical supplier for your CPAP machine and the current pressure on the machine so that I can send them an order to decrease the pressure.  Try to use your CPAP machine consistently otherwise your insurance will stop paying for it.  Follow-up with the clinical pharmacist in 2 weeks for your second shingles vaccine shot.

## 2021-07-14 ENCOUNTER — Other Ambulatory Visit: Payer: Self-pay | Admitting: Internal Medicine

## 2021-07-15 NOTE — Telephone Encounter (Signed)
Requested medications are due for refill today yes  Requested medications are on the active medication list yes  Last refill 06/17/21 for one month supply  Last visit 07/10/21  Future visit scheduled 11/10/20  Notes to clinic This medication can not be delegated, please assess.

## 2021-07-16 ENCOUNTER — Other Ambulatory Visit (INDEPENDENT_AMBULATORY_CARE_PROVIDER_SITE_OTHER): Payer: Self-pay | Admitting: Bariatrics

## 2021-07-16 DIAGNOSIS — F5089 Other specified eating disorder: Secondary | ICD-10-CM

## 2021-07-16 NOTE — Telephone Encounter (Signed)
Dr.Brown 

## 2021-07-21 ENCOUNTER — Other Ambulatory Visit: Payer: Self-pay

## 2021-07-22 ENCOUNTER — Other Ambulatory Visit: Payer: Self-pay

## 2021-07-24 ENCOUNTER — Encounter (INDEPENDENT_AMBULATORY_CARE_PROVIDER_SITE_OTHER): Payer: Self-pay | Admitting: Bariatrics

## 2021-07-24 ENCOUNTER — Ambulatory Visit (INDEPENDENT_AMBULATORY_CARE_PROVIDER_SITE_OTHER): Payer: Medicaid Other | Admitting: Bariatrics

## 2021-07-24 ENCOUNTER — Other Ambulatory Visit: Payer: Self-pay

## 2021-07-24 VITALS — BP 120/74 | HR 56 | Temp 98.2°F | Ht 68.0 in | Wt 305.0 lb

## 2021-07-24 DIAGNOSIS — I10 Essential (primary) hypertension: Secondary | ICD-10-CM | POA: Diagnosis not present

## 2021-07-24 DIAGNOSIS — Z6841 Body Mass Index (BMI) 40.0 and over, adult: Secondary | ICD-10-CM | POA: Diagnosis not present

## 2021-07-24 DIAGNOSIS — E1169 Type 2 diabetes mellitus with other specified complication: Secondary | ICD-10-CM | POA: Diagnosis not present

## 2021-07-24 NOTE — Progress Notes (Signed)
Chief Complaint:   OBESITY Robin [name] is here to discuss her progress with her obesity treatment plan along with follow-up of her obesity related diagnoses. Robin [name] is on the Category 3 Plan and states she is following her eating plan approximately 30% of the time. Robin [name] states she is doing chair exercise for 15-20 minutes 2-3 times per week.  Today's visit was #: 4 Starting weight: 310 lbs Starting date: 06/03/2021 Today's weight: 305 lbs Today's date: 07/24/2021 Total lbs lost to date: 5 lbs Total lbs lost since last in-office visit: 1 lb  Interim History: Robin [name] is down an additional 1 lb. She has generalized arthritis that she states limits her activities.    Subjective:   1. Type 2 diabetes mellitus with other specified complication, without long-term current use of insulin (HCC) Robin [name] is taking Trulicity and Metformin.   2. Essential hypertension Robin [name]'s blood pressure is reasonably well controlled.   Assessment/Plan:   1. Type 2 diabetes mellitus with other specified complication, without long-term current use of insulin (HCC) Good blood sugar control is important to decrease the likelihood of diabetic complications such as nephropathy, neuropathy, limb loss, blindness, coronary artery disease, and death. Intensive lifestyle modification including diet, exercise and weight loss are the first line of treatment for diabetes.   2. Essential hypertension Robin [name] will continue medications. She is working on healthy weight loss and exercise to improve blood pressure control. We will watch for signs of hypotension as she continues her lifestyle modifications.  3. Obesity, current BMI 46.5 Robin [name] is currently in the action stage of change. As such, her goal is to continue with weight loss efforts. She has agreed to the Category 3 Plan.   Robin [name] will continue meal planning. She will adhere more closely to the plan at 80-90%. She will use bands and weights.   Exercise  goals:  As is.   Behavioral modification strategies: increasing lean protein intake, decreasing simple carbohydrates, increasing vegetables, increasing water intake, decreasing eating out, no skipping meals, meal planning and cooking strategies, keeping healthy foods in the home, and planning for success.  Robin [name] has agreed to follow-up with our clinic in 2 weeks. She was informed of the importance of frequent follow-up visits to maximize her success with intensive lifestyle modifications for her multiple health conditions.   Objective:   Blood pressure 120/74, pulse (!) 56, temperature 98.2 F (36.8 C), height 5\' 8"  (1.727 m), weight (!) 305 lb (138.3 kg), SpO2 97 %. Body mass index is 46.38 kg/m.  General: Cooperative, alert, well developed, in no acute distress. HEENT: Conjunctivae and lids unremarkable. Cardiovascular: Regular rhythm.  Lungs: Normal work of breathing. Neurologic: No focal deficits.   Lab Results  Component Value Date   CREATININE 0.74 01/04/2021   BUN 10 01/04/2021   NA 137 01/04/2021   K 3.5 01/04/2021   CL 100 01/04/2021   CO2 29 01/04/2021   Lab Results  Component Value Date   ALT 35 01/04/2021   AST 32 01/04/2021   ALKPHOS 53 01/04/2021   BILITOT 0.6 01/04/2021   Lab Results  Component Value Date   HGBA1C 6.1 (H) 06/03/2021   HGBA1C 6.7 12/02/2020   HGBA1C 6.7 (H) 02/15/2020   HGBA1C 6.5 (H) 11/13/2019   HGBA1C 5.9 (H) 04/10/2019   Lab Results  Component Value Date   INSULIN 48.4 (H) 06/03/2021   Lab Results  Component Value Date   TSH 1.296 01/04/2021   Lab Results  Component Value Date   CHOL  121 06/03/2021   HDL 52 06/03/2021   LDLCALC 54 06/03/2021   TRIG 73 06/03/2021   CHOLHDL 2.3 06/03/2021   Lab Results  Component Value Date   VD25OH 61.7 06/03/2021   Lab Results  Component Value Date   WBC 12.0 (H) 01/04/2021   HGB 12.8 01/04/2021   HCT 41.0 01/04/2021   MCV 94.5 01/04/2021   PLT 411 (H) 01/04/2021   Lab Results   Component Value Date   IRON 31 03/04/2015   TIBC 340 03/04/2015   FERRITIN 34 03/04/2015   Attestation Statements:   Reviewed by clinician on day of visit: allergies, medications, problem list, medical history, surgical history, family history, social history, and previous encounter notes.   I, Lizbeth Bark, RMA, am acting as Location manager for [employer], DO.   I have reviewed the above documentation for accuracy and completeness, and I agree with the above. Jearld Lesch, DO

## 2021-07-25 ENCOUNTER — Other Ambulatory Visit: Payer: Self-pay

## 2021-07-25 ENCOUNTER — Ambulatory Visit: Payer: Medicaid Other | Attending: Internal Medicine | Admitting: Pharmacist

## 2021-07-25 ENCOUNTER — Telehealth: Payer: Self-pay | Admitting: Internal Medicine

## 2021-07-25 DIAGNOSIS — Z23 Encounter for immunization: Secondary | ICD-10-CM | POA: Diagnosis not present

## 2021-07-25 DIAGNOSIS — G4733 Obstructive sleep apnea (adult) (pediatric): Secondary | ICD-10-CM

## 2021-07-25 NOTE — Telephone Encounter (Signed)
DME order has been faxed to [facility]

## 2021-07-25 NOTE — Telephone Encounter (Signed)
Will forward to provider  

## 2021-07-25 NOTE — Telephone Encounter (Signed)
Patient said Dr. Wynetta Emery requested to know where she got her Cpac machine from.. Rains and air pressure is 16 .

## 2021-07-25 NOTE — Progress Notes (Signed)
Patient presents for second-dose of vaccination against zoster per orders of Dr. Wynetta Emery. Consent given. Counseling provided. No contraindications exists. Vaccine administered without incident   Vaccine administered by: Darcel Smalling, Student Pharmacist [place] [employer] [place], PharmD, [place], [place] [phone]

## 2021-07-31 ENCOUNTER — Other Ambulatory Visit: Payer: Self-pay

## 2021-08-05 ENCOUNTER — Other Ambulatory Visit: Payer: Self-pay

## 2021-08-05 ENCOUNTER — Other Ambulatory Visit: Payer: Self-pay | Admitting: Internal Medicine

## 2021-08-05 ENCOUNTER — Other Ambulatory Visit (HOSPITAL_COMMUNITY): Payer: Self-pay | Admitting: Psychiatry

## 2021-08-05 DIAGNOSIS — F341 Dysthymic disorder: Secondary | ICD-10-CM

## 2021-08-05 DIAGNOSIS — F411 Generalized anxiety disorder: Secondary | ICD-10-CM

## 2021-08-05 NOTE — Telephone Encounter (Signed)
Rx filled 07/16/21- request for future fills- too early Requested Prescriptions  Pending Prescriptions Disp Refills  . losartan (COZAAR) 25 MG tablet [Pharmacy Med Name: losartan 25 mg tablet] 90 tablet 1    Sig: TAKE ONE TABLET BY MOUTH EVERY EVENING     Cardiovascular:  Angiotensin Receptor Blockers Failed - 08/05/2021  8:00 AM      Failed - Cr in normal range and within 180 days    Creat  Date Value Ref Range Status  09/22/2016 0.80 0.50 - 1.05 mg/dL Final    Comment:      For patients > or = 63 years of age: The upper reference limit for Creatinine is approximately 13% higher for people identified as African-American.      Creatinine, Ser  Date Value Ref Range Status  01/04/2021 0.74 0.44 - 1.00 mg/dL Final         Failed - K in normal range and within 180 days    Potassium  Date Value Ref Range Status  01/04/2021 3.5 3.5 - 5.1 mmol/L Final  06/25/2011 4.2 mmol/L Final         Passed - Patient is not pregnant      Passed - Last BP in normal range    BP Readings from Last 1 Encounters:  07/24/21 120/74         Passed - Valid encounter within last 6 months    Recent Outpatient Visits          1 week ago Need for shingles vaccine   [place], Annie Main L, RPH-CPP   3 weeks ago Type 2 diabetes mellitus with morbid obesity ([place])   [place] Karle Plumber B, MD   5 months ago Type 2 diabetes mellitus with morbid obesity [facility])   [place], MD   8 months ago Type 2 diabetes mellitus with morbid obesity [facility])   [place] Ladell Pier, MD   1 year ago No-show for appointment   [place], Amy J, NP      Future Appointments            In 3 months Wynetta Emery, Dalbert Batman, MD [place]           . levothyroxine (SYNTHROID) 88 MCG tablet  [Pharmacy Med Name: levothyroxine 88 mcg tablet] 90 tablet 1    Sig: TAKE ONE TABLET BY MOUTH BEFORE BREAKFAST     Endocrinology:  Hypothyroid Agents Failed - 08/05/2021  8:00 AM      Failed - TSH needs to be rechecked within 3 months after an abnormal result. Refill until TSH is due.      Passed - TSH in normal range and within 360 days    TSH  Date Value Ref Range Status  01/04/2021 1.296 0.350 - 4.500 uIU/mL Final    Comment:    Performed by a 3rd Generation assay with a functional sensitivity of <=0.01 uIU/mL. Performed at [facility], [place] [address], [place], [place] [address]   11/13/2019 2.390 0.450 - 4.500 uIU/mL Final         Passed - Valid encounter within last 12 months    Recent Outpatient Visits          1 week ago Need for shingles vaccine   [place]  Delena Serve, RPH-CPP   3 weeks ago Type 2 diabetes mellitus with morbid obesity [facility])   [place] Karle Plumber B, MD   5 months ago Type 2 diabetes mellitus with morbid obesity [facility])   [place], MD   8 months ago Type 2 diabetes mellitus with morbid obesity [facility])   [place] Ladell Pier, MD   1 year ago No-show for appointment   [place], [place], NP      Future Appointments            In 3 months Wynetta Emery Dalbert Batman, MD [place]

## 2021-08-11 ENCOUNTER — Ambulatory Visit: Payer: Medicaid Other | Admitting: Dietician

## 2021-08-13 ENCOUNTER — Other Ambulatory Visit (INDEPENDENT_AMBULATORY_CARE_PROVIDER_SITE_OTHER): Payer: Self-pay | Admitting: Bariatrics

## 2021-08-13 ENCOUNTER — Other Ambulatory Visit: Payer: Self-pay | Admitting: Internal Medicine

## 2021-08-13 ENCOUNTER — Ambulatory Visit (INDEPENDENT_AMBULATORY_CARE_PROVIDER_SITE_OTHER): Payer: Medicaid Other | Admitting: Bariatrics

## 2021-08-13 ENCOUNTER — Other Ambulatory Visit: Payer: Self-pay

## 2021-08-13 ENCOUNTER — Encounter (INDEPENDENT_AMBULATORY_CARE_PROVIDER_SITE_OTHER): Payer: Self-pay | Admitting: Bariatrics

## 2021-08-13 VITALS — BP 148/73 | HR 53 | Temp 97.7°F | Ht 68.0 in | Wt 299.0 lb

## 2021-08-13 DIAGNOSIS — I10 Essential (primary) hypertension: Secondary | ICD-10-CM

## 2021-08-13 DIAGNOSIS — E7849 Other hyperlipidemia: Secondary | ICD-10-CM

## 2021-08-13 DIAGNOSIS — R7303 Prediabetes: Secondary | ICD-10-CM

## 2021-08-13 DIAGNOSIS — F5089 Other specified eating disorder: Secondary | ICD-10-CM

## 2021-08-13 DIAGNOSIS — Z6841 Body Mass Index (BMI) 40.0 and over, adult: Secondary | ICD-10-CM

## 2021-08-13 NOTE — Progress Notes (Signed)
Chief Complaint:   OBESITY Robin [name] is here to discuss her progress with her obesity treatment plan along with follow-up of her obesity related diagnoses. Robin [name] is on the Category 3 Plan and states she is following her eating plan approximately 40% of the time. Robin [name] states she is doing 0 minutes 0 times per week.  Today's visit was #: 5 Starting weight: 310 lbs Starting date: 06/03/2021 Today's weight: 299 lbs Today's date: 08/13/2021 Total lbs lost to date: 11 lbs Total lbs lost since last in-office visit: 6 lbs  Interim History: Robin [name] is down an additional 6 lbs since her last visit. She cut back on bread. She is doing well with her water intake.   Subjective:   1. Essential hypertension Fergie is taking her medications as directed. Her blood pressure is controlled.   2. Other hyperlipidemia Robin [name] is currently taking Lipitor.  Assessment/Plan:   1. Essential hypertension Robin [name] will continue her medications. She is working on healthy weight loss and exercise to improve blood pressure control. We will watch for signs of hypotension as she continues her lifestyle modifications.  2. Other hyperlipidemia Cardiovascular risk and specific lipid/LDL goals reviewed.  We discussed several lifestyle modifications today and Robin [name] will continue to work on diet, exercise and weight loss efforts. Robin [name] will continue her medications. Orders and follow up as documented in patient record.   Counseling Intensive lifestyle modifications are the first line treatment for this issue. Dietary changes: Increase soluble fiber. Decrease simple carbohydrates. Exercise changes: Moderate to vigorous-intensity aerobic activity 150 minutes per week if tolerated. Lipid-lowering medications: see documented in medical record.   3. Obesity, current BMI 46.5 Robin [name] is currently in the action stage of change. As such, her goal is to continue with weight loss efforts. She has agreed to the  Category 3 Plan.   Robin [name] will continue meal planning and intentional eating. She will decrease sweets and bread. Strategies for the holidays were given today.   Exercise goals: No exercise has been prescribed at this time.  Behavioral modification strategies: increasing lean protein intake, decreasing simple carbohydrates, increasing vegetables, increasing water intake, decreasing eating out, no skipping meals, meal planning and cooking strategies, keeping healthy foods in the home, and planning for success.  Robin [name] has agreed to follow-up with our clinic in 2 weeks. She was informed of the importance of frequent follow-up visits to maximize her success with intensive lifestyle modifications for her multiple health conditions.   Objective:   Blood pressure (!) 148/73, pulse (!) 53, temperature 97.7 F (36.5 C), height 5\' 8"  (1.727 m), SpO2 97 %. Body mass index is 46.38 kg/m. Robin [name] is using a automated wheelchair for ambulation.  General: Cooperative, alert, well developed, in no acute distress. HEENT: Conjunctivae and lids unremarkable. Cardiovascular: Regular rhythm.  Lungs: Normal work of breathing. Neurologic: No focal deficits.   Lab Results  Component Value Date   CREATININE 0.74 01/04/2021   BUN 10 01/04/2021   NA 137 01/04/2021   K 3.5 01/04/2021   CL 100 01/04/2021   CO2 29 01/04/2021   Lab Results  Component Value Date   ALT 35 01/04/2021   AST 32 01/04/2021   ALKPHOS 53 01/04/2021   BILITOT 0.6 01/04/2021   Lab Results  Component Value Date   HGBA1C 6.1 (H) 06/03/2021   HGBA1C 6.7 12/02/2020   HGBA1C 6.7 (H) 02/15/2020   HGBA1C 6.5 (H) 11/13/2019   HGBA1C 5.9 (H) 04/10/2019   Lab Results  Component Value Date  INSULIN 48.4 (H) 06/03/2021   Lab Results  Component Value Date   TSH 1.296 01/04/2021   Lab Results  Component Value Date   CHOL 121 06/03/2021   HDL 52 06/03/2021   LDLCALC 54 06/03/2021   TRIG 73 06/03/2021   CHOLHDL 2.3  06/03/2021   Lab Results  Component Value Date   VD25OH 61.7 06/03/2021   Lab Results  Component Value Date   WBC 12.0 (H) 01/04/2021   HGB 12.8 01/04/2021   HCT 41.0 01/04/2021   MCV 94.5 01/04/2021   PLT 411 (H) 01/04/2021   Lab Results  Component Value Date   IRON 31 03/04/2015   TIBC 340 03/04/2015   FERRITIN 34 03/04/2015    Obesity Behavioral Intervention:   Approximately 15 minutes were spent on the discussion below.  ASK: We discussed the diagnosis of obesity with Robin [name] today and Robin [name] agreed to give [name] permission to discuss obesity behavioral modification therapy today.  ASSESS: Robin [name] has the diagnosis of obesity and her BMI today is 46.38. Robin [name] is in the action stage of change.   ADVISE: Robin [name] was educated on the multiple health risks of obesity as well as the benefit of weight loss to improve her health. She was advised of the need for long term treatment and the importance of lifestyle modifications to improve her current health and to decrease her risk of future health problems.  AGREE: Multiple dietary modification options and treatment options were discussed and Robin [name] agreed to follow the recommendations documented in the above note.  ARRANGE: Robin [name] was educated on the importance of frequent visits to treat obesity as outlined per CMS and USPSTF guidelines and agreed to schedule her next follow up appointment today.  Attestation Statements:   Reviewed by clinician on day of visit: allergies, medications, problem list, medical history, surgical history, family history, social history, and previous encounter notes.  I, Lizbeth Bark, RMA, am acting as Location manager for [employer], DO.   I have reviewed the above documentation for accuracy and completeness, and I agree with the above. Jearld Lesch, DO

## 2021-08-13 NOTE — Telephone Encounter (Signed)
LAST APPOINTMENT DATE: 07/24/21 NEXT APPOINTMENT DATE: 09/02/21   [facility] - [place], [place] - [address] [place] [address] Phone: [phone] Fax: [phone]  Patient is requesting a refill of the following medications: Pending Prescriptions:                       Disp   Refills   topiramate (TOPAMAX) 25 MG tablet [Pharmac*60 tab*0       Sig: TAKE ONE TABLET BY MOUTH TWICE DAILY   Date last filled: 07/08/2021 Previously prescribed by Dr. Owens Shark  Lab Results      Component                Value               Date                      HGBA1C                   6.1 (H)             06/03/2021                HGBA1C                   6.7                 12/02/2020                HGBA1C                   6.7 (H)             02/15/2020           Lab Results      Component                Value               Date                      LDLCALC                  54                  06/03/2021                CREATININE               0.74                01/04/2021           Lab Results      Component                Value               Date                      VD25OH                   61.7                06/03/2021            BP Readings from Last 3 Encounters: 08/13/21 : (!) 148/73 07/24/21 : 120/74 07/10/21 : 119/68

## 2021-08-13 NOTE — Telephone Encounter (Signed)
Requested Prescriptions  Pending Prescriptions Disp Refills  . ACCU-CHEK AVIVA PLUS test strip [Pharmacy Med Name: Accu-Chek Aviva Plus test strips] 100 strip 3    Sig: USE AS DIRECTED     Endocrinology: Diabetes - Testing Supplies Passed - 08/13/2021 11:35 AM      Passed - Valid encounter within last 12 months    Recent Outpatient Visits          2 weeks ago Need for shingles vaccine   [place], Jarome Matin, RPH-CPP   1 month ago Type 2 diabetes mellitus with morbid obesity ([place])   [place] Karle Plumber B, MD   5 months ago Type 2 diabetes mellitus with morbid obesity [facility])   [place], MD   8 months ago Type 2 diabetes mellitus with morbid obesity [facility])   [place] Ladell Pier, MD   1 year ago No-show for appointment   [place], [place], NP      Future Appointments            In 2 months Ladell Pier, MD [place]           . Metoprolol Tartrate 75 MG TABS [Pharmacy Med Name: metoprolol tartrate 75 mg tablet] 180 tablet 0    Sig: TAKE ONE TABLET BY MOUTH EVERY MORNING and TAKE ONE TABLET BY MOUTH EVERY EVENING     Cardiovascular:  Beta Blockers Failed - 08/13/2021 11:35 AM      Failed - Last BP in normal range    BP Readings from Last 1 Encounters:  08/13/21 (!) 148/73         Passed - Last Heart Rate in normal range    Pulse Readings from Last 1 Encounters:  08/13/21 (!) 53         Passed - Valid encounter within last 6 months    Recent Outpatient Visits          2 weeks ago Need for shingles vaccine   [place], Annie Main L, RPH-CPP   1 month ago Type 2 diabetes mellitus with morbid obesity ([place])   [place] Karle Plumber B, MD   5 months ago  Type 2 diabetes mellitus with morbid obesity [facility])   [place], MD   8 months ago Type 2 diabetes mellitus with morbid obesity [facility])   [place] Ladell Pier, MD   1 year ago No-show for appointment   [place], [place], NP      Future Appointments            In 2 months Wynetta Emery, Dalbert Batman, MD [place]  . levothyroxine (SYNTHROID) 88 MCG tablet [Pharmacy Med Name: levothyroxine 88 mcg tablet] 90 tablet 1    Sig: TAKE ONE TABLET BY MOUTH BEFORE BREAKFAST     Endocrinology:  Hypothyroid Agents Failed - 08/13/2021 11:35 AM      Failed - TSH needs to be rechecked within 3 months after an abnormal result. Refill until TSH is due.      Passed - TSH in normal  range and within 360 days    TSH  Date Value Ref Range Status  01/04/2021 1.296 0.350 - 4.500 uIU/mL Final    Comment:    Performed by a 3rd Generation assay with a functional sensitivity of <=0.01 uIU/mL. Performed at [facility], [place] [address] [place], [place] [address]   11/13/2019 2.390 0.450 - 4.500 uIU/mL Final         Passed - Valid encounter within last 12 months    Recent Outpatient Visits          2 weeks ago Need for shingles vaccine   [place], Jarome Matin, RPH-CPP   1 month ago Type 2 diabetes mellitus with morbid obesity ([place])   [place] Karle Plumber B, MD   5 months ago Type 2 diabetes mellitus with morbid obesity [facility])   [place], MD   8 months ago Type 2 diabetes mellitus with morbid obesity [facility])   [place] Ladell Pier, MD   1 year ago No-show for appointment   [place], [place], NP       Future Appointments            In 2 months Ladell Pier, MD [place]

## 2021-08-13 NOTE — Telephone Encounter (Signed)
OV with Dr Owens Shark

## 2021-08-14 ENCOUNTER — Encounter (HOSPITAL_COMMUNITY): Payer: Self-pay | Admitting: Psychiatry

## 2021-08-14 ENCOUNTER — Telehealth (INDEPENDENT_AMBULATORY_CARE_PROVIDER_SITE_OTHER): Payer: Medicaid Other | Admitting: Psychiatry

## 2021-08-14 DIAGNOSIS — F411 Generalized anxiety disorder: Secondary | ICD-10-CM

## 2021-08-14 DIAGNOSIS — F341 Dysthymic disorder: Secondary | ICD-10-CM

## 2021-08-14 MED ORDER — DULOXETINE HCL 60 MG PO CPEP: 60.0000 mg | ORAL_CAPSULE | Freq: Every day | ORAL | 3 refills | Status: DC

## 2021-08-14 NOTE — Progress Notes (Signed)
Psychiatric Initial Adult Assessment  Virtual Visit via Video Note  I connected with Robin [name] on 08/14/21 at 10:30 AM EST by a video enabled telemedicine application and verified that I am speaking with the correct person using two identifiers.  Location: Patient: Home Provider: Clinic   I discussed the limitations of evaluation and management by telemedicine and the availability of in person appointments. The patient expressed understanding and agreed to proceed.  I provided 30 minutes of non-face-to-face time during this encounter.   Patient Identification: Robin [name] MRN:  [id number] Date of Evaluation:  08/14/2021 Referral Source: Jonah Blue, MD Chief Complaint:  " I'm still struggling, emotional, and crying a lot" Visit Diagnosis:    ICD-10-CM   1. Persistent depressive disorder  F34.1 DULoxetine (CYMBALTA) 60 MG capsule    2. Generalized anxiety disorder  F41.1 DULoxetine (CYMBALTA) 60 MG capsule      History of Present Illness:  65 year-old female seen today for follow up psychiatric evaluation. She has psychiatric history of depression, adjustment disorder, and anxiety. She is currently managed on and Cymbalta 60 mg daily. She also takes Gabapentin and Tramadol for chronic joint pain which are managed by her PCP. She notes her medications are somewhat effective in managing her psychiatric conditions.  Today, patient is well groomed, pleasant, cooperative, engaged in conversation, and maintaining eye contact. She informed provider that she is struggling, is emotional, and crying a lot. She notes that she becomes angry with life stressors. She notes that she and her husband are still having marital issues and notes that she is worried about her finances and her children. She also notes that most days she is in pain due to arthritis which worsens her mental health. Patient notes that speaking to her grandchildren brings her a moment of happiness. She informed Clinical research associate  that her last therapy appointment was cancelled but notes that she is looking forward to her next appointment in a few weeks.    Today, provider conducted a GAD-7 and patient scored a 9, at her last visit she scored a 16. Provider also conducted a PHQ-9 and patient scored a 10, at her last visit she scored a 13. She endorses sleeping approximately 5 to 6 hours nightly as she wakes up multiple times through the night with trouble falling back asleep. She also reports napping for 1-2 hours in the day. She endorses adequate appetite and notes that she has lost 9 pounds since her last visit. She notes that she was recently placed on a pill which helps her lose weight.    Provider informed patient that therapy maybe more effective in helping her manage life stressors opposed to medication adjustments which she was agreeable to. No medication changed made today. Patient agreeable to continue medications as prescribed. She will follow up with outpatient counseling for therapy.  No other concerns noted at this time.    Associated Signs/Symptoms: Depression Symptoms:  depressed mood, anhedonia, insomnia, psychomotor agitation, fatigue, feelings of worthlessness/guilt, difficulty concentrating, hopelessness, suicidal attempt, anxiety, loss of energy/fatigue, weight loss, weight gain, increased appetite, decreased appetite, (Hypo) Manic Symptoms:  Distractibility, Irritable Mood, Anxiety Symptoms:  Excessive Worry, Psychotic Symptoms:   Denies PTSD Symptoms: NA  Past Psychiatric History: Depression, adjustment disorder, and anxiety  Previous Psychotropic Medications:  Amitriptyline, hydroxyzine, Risperdal,Cymbalta, Gabapentin, Wellbutrin    Substance Abuse History in the last 12 months:  No.  Consequences of Substance Abuse: NA  Past Medical History:  Past Medical History:  Diagnosis Date  Anxiety disorder    Arthritis    Asthma    Back pain    Chronic neck pain    Constipation     Depression    Diabetes mellitus without complication (HCC)    Dyspnea    Edema of lower extremity    Fibromyalgia    GERD (gastroesophageal reflux disease)    Heartburn    Hiatal hernia    small   Hyperlipidemia    Hypertension    Hypothyroidism    Joint pain    Morbid obesity with BMI of 50.0-59.9, adult (HCC)    Osteoarthritis    Other fatigue    Schatzki's ring    non critical   Sinusitis    Sleep apnea    CPAP, Sleep study at [facility]   SOB (shortness of breath) on exertion    Swallowing difficulty     Past Surgical History:  Procedure Laterality Date   ABDOMINAL HYSTERECTOMY  02/18/2000   CARDIAC CATHETERIZATION  08/18/2003   normal L main/LAD/L Cfx/RCA (Dr. Adora Fridge)   COLONOSCOPY  2006   Dr. [name], hyperplastic polyps   COLONOSCOPY  08/06/2011   abnormal terminal ileum for 10cm, erosions, geographical ulceration. Bx small bowel mucosa with prominent intramucosal lymphoid aggregates, slightly inflammed   COLONOSCOPY WITH PROPOFOL N/A 12/28/2019   Rourk: 4 sessile serrated adenomas removed on the colon.  Next colonoscopy in 3 years.   DIRECT LARYNGOSCOPY  03/15/2012   Procedure: DIRECT LARYNGOSCOPY;  Surgeon: [name], MD;  Location: [facility];  Service: ENT;  Laterality: N/A; Dr. Wolicki--:>no foreign body seen. normal esophagus to 40cm   ESOPHAGEAL DILATION  04/17/2015   Procedure: ESOPHAGEAL DILATION;  Surgeon: [name], MD;  [facility];  Service: Endoscopy;;   ESOPHAGOGASTRODUODENOSCOPY  08/23/2008   XTA:VWPV distal esophageal erosions consistent with mild erosive reflux esophagitis, otherwise unremarkable esophagus/ Tiny antral erosions of doubtful clinical significance, otherwise normal stomach, patent pylorus, normal D1 and D2   ESOPHAGOGASTRODUODENOSCOPY  08/06/2011   small hh, noncritical Schatzki's ring s/p 30 F   ESOPHAGOGASTRODUODENOSCOPY N/A 04/17/2015   XYI:AXKPVV s/p dilation   ESOPHAGOGASTRODUODENOSCOPY  (EGD) WITH PROPOFOL N/A 01/20/2018   Dr. [name]: Erosive gastropathy, normal-appearing esophagus status post empiric dilation.  Chronic gastritis, no H. pylori.   ESOPHAGOSCOPY  03/15/2012   Procedure: ESOPHAGOSCOPY;  Surgeon: [name], MD;  Location: [facility];  Service: ENT;  Laterality: N/A;   KNEE ARTHROSCOPY  04/27/2001   MALONEY DILATION N/A 01/20/2018   Procedure: Venia Minks DILATION;  Surgeon: [name], MD;  [facility];  Service: Endoscopy;  Laterality: N/A;   NM MYOCAR PERF WALL MOTION  2003   negative bruce protocol exercise tress test; EF 68%; intermediate risk study due to evidence of anterior wall ischemia extending from mid-ventricle to apex   PITUITARY SURGERY  06/2012   benign tumor, [facility] at [facility]   POLYPECTOMY  12/28/2019   Procedure: POLYPECTOMY;  Surgeon: [name], MD;  [facility];  Service: Endoscopy;;   RECTOCELE REPAIR     TRANSTHORACIC ECHOCARDIOGRAM  2003   EF normal   VAGINAL PROLAPSE REPAIR     VIDEO BRONCHOSCOPY Bilateral 03/08/2017   Procedure: VIDEO BRONCHOSCOPY WITHOUT FLUORO;  Surgeon: Javier Glazier, MD;  Location: [facility];  Service: Cardiopulmonary;  Laterality: Bilateral;    Family Psychiatric History: Brother depression and alcohol use  Family History:  Family History  Problem Relation Age of Onset   Diabetes  Mother    Hypertension Mother    Heart Problems Mother    Hyperlipidemia Mother    Asthma Mother    Sleep apnea Mother    Obesity Mother    Diabetes Father    Kidney cancer Father    Hypertension Father    Hyperlipidemia Father    Sleep apnea Father    Hypertension Sister    Allergic rhinitis Sister    Hypertension Sister    Hyperlipidemia Sister    Liver disease Brother    Arthritis Brother    Hypertension Brother    Hyperlipidemia Brother    Hypertension Brother    Breast cancer Maternal Grandmother    Kidney disease Maternal Grandfather    Breast cancer Paternal Grandmother     Eczema Daughter    Lupus Maternal Aunt    Stomach cancer Other        aunt   Colon cancer Neg Hx    Immunodeficiency Neg Hx    Urticaria Neg Hx     Social History:   Social History   Socioeconomic History   Marital status: Married    Spouse name: Not on file   Number of children: 4   Years of education: 10   Highest education level: Not on file  Occupational History    Employer: UNEMPLOYED  Tobacco Use   Smoking status: Former    Packs/day: 0.50    Years: 10.00    Pack years: 5.00    Types: Cigarettes    Start date: 07/14/1975    Quit date: 04/04/1993    Years since quitting: 28.3   Smokeless tobacco: Never   Tobacco comments:    quit 20 yrs ago  Vaping Use   Vaping Use: Never used  Substance and Sexual Activity   Alcohol use: No    Alcohol/week: 0.0 standard drinks   Drug use: No   Sexual activity: Yes  Other Topics Concern   Not on file  Social History Narrative   Lives with husband in an apartment on the first floor.  Has 4 children.     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  Trying to get disability.  Formerly worked as a Teacher, early years/pre.  Education: 11th grade.      [facility] (12/23/16):   Originally from [facility]. She was raised in [place]. 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 No pets currently. No bird exposure. She does have mold in her current home in the bathroom, laundry room, & master bedroom.    Social Determinants of Health   Financial Resource Strain: Low Risk    Difficulty of Paying Living Expenses: Not very hard  Food Insecurity: No Food Insecurity   Worried About Charity fundraiser in the Last Year: Never true   Ran Out of Food in the Last Year: Never true  Transportation Needs: Unmet Transportation Needs   Lack of Transportation (Medical): Yes   Lack of Transportation  (Non-Medical): No  Physical Activity: Inactive   Days of Exercise per Week: 0 days   Minutes of Exercise per Session: 0 min  Stress: Stress Concern Present   Feeling of Stress : To some extent  Social Connections: Socially Integrated   Frequency of Communication with Friends and Family: More than three times a week   Frequency  of Social Gatherings with Friends and Family: More than three times a week   Attends Religious Services: More than 4 times per year   Active Member of [employer] or Organizations: Yes   Attends Music therapist: More than 4 times per year   Marital Status: Married    Additional Social History: Patient resides in [place].  She is married however notes that she may be separating soon.  She has poor older children.  Currently she is unemployed and seeking disability.  She denies tobacco, alcohol, or illegal drug use  Allergies:   Allergies  Allergen Reactions   Celexa [Citalopram Hydrobromide] Other (See Comments)    "Made me feel out of it"   Gabapentin Other (See Comments)    Contributed to lower extremity edema   Norvasc [Amlodipine Besylate] Other (See Comments)    Edema in lower extremities   Diflucan [Fluconazole] Rash    Metabolic Disorder Labs: Lab Results  Component Value Date   HGBA1C 6.1 (H) 06/03/2021   MPG 114 03/04/2015   No results found for: PROLACTIN Lab Results  Component Value Date   CHOL 121 06/03/2021   TRIG 73 06/03/2021   HDL 52 06/03/2021   CHOLHDL 2.3 06/03/2021   VLDL 13 12/16/2015   LDLCALC 54 06/03/2021   LDLCALC 71 10/25/2019   Lab Results  Component Value Date   TSH 1.296 01/04/2021    Therapeutic Level Labs: No results found for: LITHIUM No results found for: CBMZ No results found for: VALPROATE  Current Medications: Current Outpatient Medications  Medication Sig Dispense Refill   ACCU-CHEK AVIVA PLUS test strip USE AS DIRECTED 100 strip 3   acetaminophen (TYLENOL) 500 MG tablet Take 1,000 mg by  mouth 2 (two) times daily.      albuterol (VENTOLIN HFA) 108 (90 Base) MCG/ACT inhaler Inhale 1-2 puffs into the lungs every 6 (six) hours as needed for wheezing or shortness of breath. 18 g 6   aspirin EC 81 MG tablet Take 81 mg by mouth at bedtime.     atorvastatin (LIPITOR) 40 MG tablet Take 1 tablet (40 mg total) by mouth daily. 90 tablet 2   azelastine (ASTELIN) 0.1 % nasal spray 1-2 sprays per nostril 2 times daily as needed 30 mL 12   CALCIUM CARBONATE ANTACID PO Take 2 tablets by mouth See admin instructions. Take 2 tablets by mouth every morning, may also take 2 tablets at night as needed for acid reflux     celecoxib (CELEBREX) 100 MG capsule TAKE 1 CAPSULE (100 MG TOTAL) BY MOUTH 2 (TWO) TIMES DAILY. 180 capsule 2   CINNAMON PO Take 1 capsule by mouth daily.     conjugated estrogens (PREMARIN) vaginal cream Place 1 Applicatorful vaginally daily as needed (vaginal dryness/itching).     [employer] MAGNESIUM OXIDE 250 MG TABS Take 1 tablet by mouth daily.     DULoxetine (CYMBALTA) 60 MG capsule Take 1 capsule (60 mg total) by mouth daily. 30 capsule 3   esomeprazole (NEXIUM) 40 MG capsule Take 1 capsule (40 mg total) by mouth 2 (two) times daily before a meal. 60 capsule 5   fluticasone (FLONASE) 50 MCG/ACT nasal spray Place 1 spray into both nostrils daily. 16 g 1   fluticasone (FLOVENT HFA) 220 MCG/ACT inhaler INHALE 2 INHALATIONS TWICE DAILY (Patient not taking: Reported on 06/03/2021) 12 g 12   furosemide (LASIX) 40 MG tablet TAKE 1/2 TO 1 TABLET BY MOUTH DAILY FOR LOWER EXTREMITY SWELLING 90 tablet 2  gabapentin (NEURONTIN) 600 MG tablet 1 tab po Q a.m and afternoon and 1 1/2 tab PO Q p.m 315 tablet 2   glucose blood test strip USE AS INSTRUCTED     glucose blood test strip USE AS INSTRUCTED     hydrALAZINE (APRESOLINE) 50 MG tablet Take 1 tablet (50 mg total) by mouth 2 (two) times daily. 270 tablet 3   hydrochlorothiazide (HYDRODIURIL) 25 MG tablet Take 1 tablet (25 mg total) by mouth  daily. 90 tablet 3   hydrOXYzine (ATARAX/VISTARIL) 25 MG tablet Take 25 mg by mouth daily as needed.     levothyroxine (SYNTHROID) 88 MCG tablet TAKE 1 TABLET (88 MCG TOTAL) BY MOUTH DAILY. 90 tablet 1   linaclotide (LINZESS) 290 MCG CAPS capsule TAKE 1 CAPSULE BY MOUTH DAILY BEFORE BREAKFAST. 90 capsule 3   losartan (COZAAR) 25 MG tablet Take 1 tablet (25 mg total) by mouth daily. 90 tablet 2   metFORMIN (GLUCOPHAGE) 500 MG tablet 2 tabs PO Q a.m and 1 tab in evenings 270 tablet 1   Metoprolol Tartrate 75 MG TABS TAKE ONE TABLET BY MOUTH EVERY MORNING and TAKE ONE TABLET BY MOUTH EVERY EVENING 180 tablet 0   montelukast (SINGULAIR) 10 MG tablet TAKE 1 TABLET BY MOUTH AT BEDTIME. 90 tablet 2   Multiple Vitamin (MULTIVITAMIN WITH MINERALS) TABS tablet Take 1 tablet by mouth daily.     NARCAN 4 MG/0.1ML LIQD nasal spray kit      Omega-3 Fatty Acids (FISH OIL) 1000 MG CAPS Take 1,000 mg by mouth at bedtime.     spironolactone (ALDACTONE) 25 MG tablet Take 1 tablet (25 mg total) by mouth daily. 90 tablet 1   topiramate (TOPAMAX) 25 MG tablet TAKE ONE TABLET BY MOUTH TWICE DAILY 60 tablet 0   traMADol (ULTRAM) 50 MG tablet Take 1 tablet (50 mg total) by mouth 2 (two) times daily as needed. 60 tablet 2   TRULICITY 4.69 GE/9.5MW SOPN Inject 0.75 mg into the skin once a week. 9 mL 3   vitamin B-12 (CYANOCOBALAMIN) 1000 MCG tablet Take 1,000 mcg by mouth daily.     Vitamin D, Ergocalciferol, (DRISDOL) 1.25 MG (50000 UNIT) CAPS capsule Take 1 capsule (50,000 Units total) by mouth once a week. 12 capsule 0   No current facility-administered medications for this visit.    Musculoskeletal: Strength & Muscle Tone: decreased and Patient is in a wheelchair [place]:  Patient ina wheel chair Patient leans: N/A  Psychiatric Specialty Exam: Review of Systems  There were no vitals taken for this visit.There is no height or weight on file to calculate BMI.  General Appearance: Well Groomed  Eye  Contact:  Good  Speech:  Clear and Coherent and Normal Rate  Volume:  Normal  Mood:  Anxious and Depressed  Affect:  Appropriate and Congruent  Thought Process:  Coherent, Goal Directed, and Linear  Orientation:  Full (Time, Place, and Person)  Thought Content:  WDL and Logical  Suicidal Thoughts:  No  Homicidal Thoughts:  No  Memory:  Immediate;   Good Recent;   Good Remote;   Good  Judgement:  Good  Insight:  Good  Psychomotor Activity:  Decreased  Concentration:  Concentration: Good and Attention Span: Good  Recall:  Good  Fund of Knowledge:Good  Language: Good  Akathisia:  No  Handed:  Right  AIMS (if indicated):  not done  Assets:  Communication Skills Desire for Improvement Financial Resources/Insurance Housing Leisure Time Social Support Transportation  ADL's:  Intact  Cognition: WNL  Sleep:  Fair   Screenings: GAD-7    Flowsheet Row Video Visit from 08/14/2021 in [facility] Office Visit from 07/10/2021 in [facility] Office Visit from 05/15/2021 in [facility] Office Visit from 02/27/2021 in [facility] Office Visit from 11/29/2020 in [facility]  Total GAD-7 Score $RemoveBef'9 13 16 11 14      'QnvigqwVES$ PHQ2-9    Flowsheet Row Video Visit from 08/14/2021 in [facility] Office Visit from 07/10/2021 in [facility] Office Visit from 06/03/2021 in [facility] Office Visit from 05/15/2021 in [facility] Counselor from 04/30/2021 in [facility]  PHQ-2 Total Score $RemoveBef'4 4 5 4 6  'QoPCBCDCrJ$ PHQ-9 Total Score $RemoveBef'10 13 14 13 19      'yAxmSYXLQM$ [facility] Office Visit from 05/15/2021 in [facility] Counselor from 04/30/2021 in [facility] ED from 01/04/2021 in [facility] No Risk No Risk No Risk       Assessment and Plan: Patient endorses symptoms of anxiety and depression related to life stressors.  Provider informed patient that therapy maybe more effective in helping her manage life stressors opposed to medication adjustments which she was agreeable to. No medication changed made today. Patient agreeable to continue medications as prescribed.   1. Persistent depressive disorder  Continue- DULoxetine (CYMBALTA) 60 MG capsule; Take 1 capsule (60 mg total) by mouth daily.  Dispense: 30 capsule; Refill: 3   2. Generalized anxiety disorder  Continue- DULoxetine (CYMBALTA) 60 MG capsule; Take 1 capsule (60 mg total) by mouth daily.  Dispense: 30 capsule; Refill: 3   Follow-up in 3 months Follow-up with therapy Salley Slaughter, NP 11/10/202210:52 AM

## 2021-08-18 ENCOUNTER — Encounter (INDEPENDENT_AMBULATORY_CARE_PROVIDER_SITE_OTHER): Payer: Self-pay | Admitting: Bariatrics

## 2021-08-18 ENCOUNTER — Other Ambulatory Visit: Payer: Self-pay

## 2021-08-22 ENCOUNTER — Ambulatory Visit: Payer: Medicaid Other | Admitting: Dietician

## 2021-08-26 ENCOUNTER — Encounter: Payer: Self-pay | Admitting: Podiatry

## 2021-08-26 ENCOUNTER — Ambulatory Visit (INDEPENDENT_AMBULATORY_CARE_PROVIDER_SITE_OTHER): Payer: Medicaid Other | Admitting: Podiatry

## 2021-08-26 ENCOUNTER — Other Ambulatory Visit: Payer: Self-pay

## 2021-08-26 DIAGNOSIS — M79674 Pain in right toe(s): Secondary | ICD-10-CM

## 2021-08-26 DIAGNOSIS — M79675 Pain in left toe(s): Secondary | ICD-10-CM | POA: Diagnosis not present

## 2021-08-26 DIAGNOSIS — E1142 Type 2 diabetes mellitus with diabetic polyneuropathy: Secondary | ICD-10-CM

## 2021-08-26 DIAGNOSIS — B351 Tinea unguium: Secondary | ICD-10-CM

## 2021-08-29 NOTE — Progress Notes (Signed)
Subjective: Robin [name] is a 65 y.o. female patient seen today for follow up of  painful thick toenails that are difficult to trim. Pain interferes with ambulation. Aggravating factors include wearing enclosed shoe gear. Pain is relieved with periodic professional debridement.  She has history of diabetic neuropathy.  New problems reported today: None.  Patient states their blood glucose was 105 mg/dl on yesterday.   PCP is Ladell Pier, MD. Last visit was: 07/10/2021.  Allergies  Allergen Reactions   Amlodipine Besylate Other (See Comments)    Edema in lower extremities Other reaction(s): Other Edema in lower extremities   Celexa [Citalopram Hydrobromide] Other (See Comments)    "Made me feel out of it"   Gabapentin Other (See Comments)    Contributed to lower extremity edema   Diflucan [Fluconazole] Rash    Objective: Physical Exam  General: Patient is a pleasant 65 y.o. African American female morbidly obese in NAD. AAO x 3.   Neurovascular Examination: CFT immediate b/l LE. Palpable DP/PT pulses b/l LE. Digital hair sparse b/l. Skin temperature gradient WNL b/l. No pain with calf compression b/l. Trace edema b/l LE. No cyanosis or clubbing noted b/l LE.  Pt has subjective symptoms of neuropathy. Protective sensation intact 5/5 intact bilaterally with 10g monofilament b/l. Vibratory sensation intact b/l.  Dermatological:  Pedal integument with normal turgor, texture and tone b/l LE. No open wounds b/l. No interdigital macerations b/l. Toenails 1-5 b/l elongated, thickened, discolored with subungual debris. +Tenderness with dorsal palpation of nailplates. No hyperkeratotic or porokeratotic lesions present. Incurvated nailplate medial border(s) R hallux.  Nail border hypertrophy minimal. There is tenderness to palpation. Sign(s) of infection: no clinical signs of infection noted on examination today..  Musculoskeletal:  Normal muscle strength 5/5 to all lower extremity  muscle groups bilaterally. No pain, crepitus or joint limitation noted with ROM b/l LE. No gross bony pedal deformities b/l. Patient ambulates independently without assistive aids.  Assessment: 1. Pain due to onychomycosis of toenails of both feet   2. Diabetic peripheral neuropathy associated with type 2 diabetes mellitus ([place])    Plan: Patient was evaluated and treated and all questions answered. Consent given for treatment as described below: -Continue foot and shoe inspections daily. Monitor blood glucose per PCP/Endocrinologist's recommendations. -Mycotic toenails 1-5 bilaterally were debrided in length and girth with sterile nail nippers and dremel without incident. -Offending nail border debrided and curretaged R hallux utilizing sterile nail nipper and currette. Border cleansed with alcohol and triple antibiotic applied. No further treatment required by patient/caregiver. Discussed treatment options available for ingrown toenail. Patient will think about if if problem persists. -Patient/POA to call should there be question/concern in the interim.  Return in about 3 months (around 11/26/2021).  Marzetta Board, DPM

## 2021-09-01 ENCOUNTER — Other Ambulatory Visit: Payer: Self-pay

## 2021-09-02 ENCOUNTER — Encounter (INDEPENDENT_AMBULATORY_CARE_PROVIDER_SITE_OTHER): Payer: Self-pay | Admitting: Bariatrics

## 2021-09-02 ENCOUNTER — Other Ambulatory Visit: Payer: Self-pay

## 2021-09-02 ENCOUNTER — Other Ambulatory Visit: Payer: Self-pay | Admitting: Internal Medicine

## 2021-09-02 ENCOUNTER — Ambulatory Visit (INDEPENDENT_AMBULATORY_CARE_PROVIDER_SITE_OTHER): Payer: Medicaid Other | Admitting: Bariatrics

## 2021-09-02 VITALS — BP 140/82 | HR 61 | Temp 97.7°F | Ht 68.0 in | Wt 299.0 lb

## 2021-09-02 DIAGNOSIS — Z6841 Body Mass Index (BMI) 40.0 and over, adult: Secondary | ICD-10-CM

## 2021-09-02 DIAGNOSIS — F5089 Other specified eating disorder: Secondary | ICD-10-CM

## 2021-09-02 DIAGNOSIS — E669 Obesity, unspecified: Secondary | ICD-10-CM | POA: Diagnosis not present

## 2021-09-02 DIAGNOSIS — E1169 Type 2 diabetes mellitus with other specified complication: Secondary | ICD-10-CM

## 2021-09-02 MED ORDER — TRULICITY 1.5 MG/0.5ML ~~LOC~~ SOAJ: 1.5000 mg | SUBCUTANEOUS | 0 refills | Status: DC

## 2021-09-02 MED ORDER — TOPIRAMATE 25 MG PO TABS: 25.0000 mg | ORAL_TABLET | Freq: Two times a day (BID) | ORAL | 0 refills | Status: DC

## 2021-09-02 NOTE — Progress Notes (Signed)
Chief Complaint:   OBESITY Robin [name] is here to discuss her progress with her obesity treatment plan along with follow-up of her obesity related diagnoses. Robin [name] is on the Category 3 Plan and states she is following her eating plan approximately 40% of the time. Robin [name] states she is doing chair exercise for 20-30 minutes 3 times per week.  Today's visit was #: 6 Starting weight: 310 lbs Starting date: 06/03/2021 Today's weight: 299 lbs Today's date: 09/02/2021 Total lbs lost to date: 11 lbs Total lbs lost since last in-office visit: 0  Interim History: Robin [name]'s weight remains the same over the holidays. She did struggled over Thanksgiving but sometimes with water and protein.  Subjective:   1. Diabetes mellitus type 2 in obese Robin [name], Robin [name]) Robin [name] is currently taking Trulicity and Metformin. She notes hunger. Her last A1C level was 6.2.  2. Other disorder of eating Robin [name] is taking her medications as directed.  Assessment/Plan:   1. Diabetes mellitus type 2 in obese Robin [name]) Robin [name] will continue her medications. Robin [name] agrees to increase Trulicity to 1.5 mg from 0.75 mg. We will refill Turlicity with no refills. Good blood sugar control is important to decrease the likelihood of diabetic complications such as nephropathy, neuropathy, limb loss, blindness, coronary artery disease, and death. Intensive lifestyle modification including diet, exercise and weight loss are the first line of treatment for diabetes.   - Dulaglutide (TRULICITY) 1.5 CB/4.4HQ SOPN; Inject 1.5 mg into the skin once a week.  Dispense: 2 mL; Refill: 0  2. Other disorder of eating Behavior modification techniques were discussed today to help Robin [name] deal with her emotional/non-hunger eating behaviors.  We will refill Topamax 20 mg for 1 month with no refills Orders and follow up as documented in patient record.    - topiramate (TOPAMAX) 25 MG tablet; Take 1 tablet (25 mg total) by mouth 2 (two) times daily.   Dispense: 60 tablet; Refill: 0  3. Obesity, current BMI 45.4 Robin [name] is currently in the action stage of change. As such, her goal is to continue with weight loss efforts. She has agreed to the Category 3 Plan.   Robin [name] will adhere closely to the plan 80-90%. She will be mindful eating.  Exercise goals:  As is.  Behavioral modification strategies: increasing lean protein intake, decreasing simple carbohydrates, increasing vegetables, increasing water intake, decreasing eating out, no skipping meals, meal planning and cooking strategies, keeping healthy foods in the home, and planning for success.  Robin [name] has agreed to follow-up with our clinic in 3 weeks with Robin Marble, NP or Robin [facility], FNP and 6 weeks with myself. She was informed of the importance of frequent follow-up visits to maximize her success with intensive lifestyle modifications for her multiple health conditions.   Objective:   Blood pressure 140/82, pulse 61, temperature 97.7 F (36.5 C), height 5\' 8"  (1.727 m), weight 299 lb (135.6 kg), SpO2 96 %. Body mass index is 45.46 kg/m.  General: Cooperative, alert, well developed, in no acute distress. HEENT: Conjunctivae and lids unremarkable. Cardiovascular: Regular rhythm.  Lungs: Normal work of breathing. Neurologic: No focal deficits.   Lab Results  Component Value Date   CREATININE 0.74 01/04/2021   BUN 10 01/04/2021   NA 137 01/04/2021   K 3.5 01/04/2021   CL 100 01/04/2021   CO2 29 01/04/2021   Lab Results  Component Value Date   ALT 35 01/04/2021   AST 32 01/04/2021   ALKPHOS 53 01/04/2021   BILITOT 0.6 01/04/2021  Lab Results  Component Value Date   HGBA1C 6.1 (H) 06/03/2021   HGBA1C 6.7 12/02/2020   HGBA1C 6.7 (H) 02/15/2020   HGBA1C 6.5 (H) 11/13/2019   HGBA1C 5.9 (H) 04/10/2019   Lab Results  Component Value Date   INSULIN 48.4 (H) 06/03/2021   Lab Results  Component Value Date   TSH 1.296 01/04/2021   Lab Results  Component  Value Date   CHOL 121 06/03/2021   HDL 52 06/03/2021   LDLCALC 54 06/03/2021   TRIG 73 06/03/2021   CHOLHDL 2.3 06/03/2021   Lab Results  Component Value Date   VD25OH 61.7 06/03/2021   Lab Results  Component Value Date   WBC 12.0 (H) 01/04/2021   HGB 12.8 01/04/2021   HCT 41.0 01/04/2021   MCV 94.5 01/04/2021   PLT 411 (H) 01/04/2021   Lab Results  Component Value Date   IRON 31 03/04/2015   TIBC 340 03/04/2015   FERRITIN 34 03/04/2015   Attestation Statements:   Reviewed by clinician on day of visit: allergies, medications, problem list, medical history, surgical history, family history, social history, and previous encounter notes.  I, Lizbeth Bark, RMA, am acting as Location manager for [employer], DO.   I have reviewed the above documentation for accuracy and completeness, and I agree with the above. Jearld Lesch, DO

## 2021-09-03 ENCOUNTER — Encounter (INDEPENDENT_AMBULATORY_CARE_PROVIDER_SITE_OTHER): Payer: Self-pay | Admitting: Bariatrics

## 2021-09-03 NOTE — Telephone Encounter (Signed)
Requested Prescriptions  Pending Prescriptions Disp Refills  . VENTOLIN HFA 108 (90 Base) MCG/ACT inhaler [Pharmacy Med Name: Ventolin HFA 90 mcg/actuation aerosol inhaler] 8.5 g 6    Sig: INHALE 1-2 PUFFS into THE lungs every SIX hours AS NEEDED FOR WHEEZING OR FOR SHORTNESS OF BREATH     Pulmonology:  Beta Agonists Failed - 09/02/2021  8:03 AM      Failed - One inhaler should last at least one month. If the patient is requesting refills earlier, contact the patient to check for uncontrolled symptoms.      Passed - Valid encounter within last 12 months    Recent Outpatient Visits          1 month ago Need for shingles vaccine   [place], Jarome Matin, RPH-CPP   1 month ago Type 2 diabetes mellitus with morbid obesity [facility])   [place] Karle Plumber B, MD   6 months ago Type 2 diabetes mellitus with morbid obesity [facility])   [place], MD   9 months ago Type 2 diabetes mellitus with morbid obesity [facility])   [place] Ladell Pier, MD   1 year ago No-show for appointment   [place], Pender, NP      Future Appointments            In 2 months Ladell Pier, MD [place]

## 2021-09-04 ENCOUNTER — Ambulatory Visit (INDEPENDENT_AMBULATORY_CARE_PROVIDER_SITE_OTHER): Payer: Medicaid Other | Admitting: Clinical

## 2021-09-04 ENCOUNTER — Other Ambulatory Visit: Payer: Self-pay

## 2021-09-04 DIAGNOSIS — F341 Dysthymic disorder: Secondary | ICD-10-CM | POA: Diagnosis not present

## 2021-09-06 NOTE — Progress Notes (Signed)
   THERAPIST PROGRESS NOTE  Session Time: 40 minutes  Participation Level: Active  Behavioral Response: CasualAlertDepressed  Type of Therapy: Individual Therapy  Treatment Goals addressed: Coping  Interventions: CBT and Supportive  Summary:  Robin [name] is a 65 y.o. female who presents for the scheduled appointment oriented x5, appropriately dressed, and friendly.  Client denied hallucinations and delusions. Client presents for initial appointment with this therapist.  Client reported she is currently seeing a [facility] psychiatrist for medication management which is going well.  Client reported she would like to engage in counseling due to her ongoing crying spells.  Client reported her crying spells have increased to daily now.  Client reported her primary stressor has been her marriage.  Client reported that she 53 years that she has been married to her husband.  Client reported throughout her marriage her husband has cheated on her for majority of the time together.  Client reported over the past 3 years her husband has been in a relationship with a woman whom she is known about.  Client reported she has tried talking to her sister and her children about it but her family has disregarded her facts about the relationship.  Client reported her husband has vocalized he does not want her anymore.  Client reported she has thought about what has kept her in the marriage although she has walked away on several occasions but she is not sure why she continued to stay.  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  Client reported she has 4 children whom she has a relationship with.  Client reported her husband has cancer but does not talk to her about his treatments and how to going.  Client reported ongoing she would like to have emotional  support.    Suicidal/Homicidal: Nowithout intent/plan  Therapist Response:  Therapist began the appointment making introductions and discussing confidentiality. Therapist used CBT to utilize positive emotional support and active listening towards the clients thoughts and feelings. Therapist used CBT to ask the client about psychosocial stressors which impact her depressive symptoms. Therapist used CBT to ask client about medication compliance and its effectiveness towards her symptoms. Therapist used CBT to ask the client about social supports she has. Therapist used CBT to complete the [facility] with client;. Therapist used CBT to complete the client's treatment plan. Client was scheduled for next appointment.    Plan: Return again in 5 weeks.  Diagnosis: Persistent depressive disorder  [name], LCSW 09/04/2021

## 2021-09-07 ENCOUNTER — Other Ambulatory Visit: Payer: Self-pay | Admitting: Gastroenterology

## 2021-09-07 ENCOUNTER — Other Ambulatory Visit: Payer: Self-pay | Admitting: Internal Medicine

## 2021-09-07 NOTE — Telephone Encounter (Signed)
Requested Prescriptions  Pending Prescriptions Disp Refills  . spironolactone (ALDACTONE) 25 MG tablet [Pharmacy Med Name: spironolactone 25 mg tablet] 90 tablet 0    Sig: TAKE ONE TABLET BY MOUTH EVERY MORNING     Cardiovascular: Diuretics - Aldosterone Antagonist Failed - 09/07/2021  8:03 AM      Failed - Last BP in normal range    BP Readings from Last 1 Encounters:  09/02/21 140/82         Passed - Cr in normal range and within 360 days    Creat  Date Value Ref Range Status  09/22/2016 0.80 0.50 - 1.05 mg/dL Final    Comment:      For patients > or = 63 years of age: The upper reference limit for Creatinine is approximately 13% higher for people identified as African-American.      Creatinine, Ser  Date Value Ref Range Status  01/04/2021 0.74 0.44 - 1.00 mg/dL Final         Passed - K in normal range and within 360 days    Potassium  Date Value Ref Range Status  01/04/2021 3.5 3.5 - 5.1 mmol/L Final  06/25/2011 4.2 mmol/L Final         Passed - Na in normal range and within 360 days    Sodium  Date Value Ref Range Status  01/04/2021 137 135 - 145 mmol/L Final  09/08/2018 139 134 - 144 mmol/L Final         Passed - Valid encounter within last 6 months    Recent Outpatient Visits          1 month ago Need for shingles vaccine   [place], Annie Main L, RPH-CPP   1 month ago Type 2 diabetes mellitus with morbid obesity ([place])   [place] Karle Plumber B, MD   6 months ago Type 2 diabetes mellitus with morbid obesity [facility])   [place] Karle Plumber B, MD   9 months ago Type 2 diabetes mellitus with morbid obesity [facility])   [place] Ladell Pier, MD   1 year ago No-show for appointment   [place], Amy J, NP      Future Appointments            In 2 months Wynetta Emery Dalbert Batman,  MD [place]           . metFORMIN (GLUCOPHAGE) 500 MG tablet [Pharmacy Med Name: metformin 500 mg tablet] 270 tablet 0    Sig: TAKE TWO TABLETS BY MOUTH EVERY MORNING and TAKE ONE TABLET EVERY EVENING     Endocrinology:  Diabetes - Biguanides Failed - 09/07/2021  8:03 AM      Failed - AA eGFR in normal range and within 360 days    GFR, Est African American  Date Value Ref Range Status  09/22/2016 >89 >=60 mL/min Final   GFR calc Af Amer  Date Value Ref Range Status  12/26/2019 >60 >60 mL/min Final   GFR, Est Non African American  Date Value Ref Range Status  09/22/2016 82 >=60 mL/min Final   GFR, Estimated  Date Value Ref Range Status  01/04/2021 >60 >60 mL/min Final    Comment:    (NOTE) Calculated using the CKD-EPI Creatinine Equation (2021)    GFR  Date Value Ref Range Status  03/08/2018  87.84 >60.00 mL/min Final         Passed - Cr in normal range and within 360 days    Creat  Date Value Ref Range Status  09/22/2016 0.80 0.50 - 1.05 mg/dL Final    Comment:      For patients > or = 63 years of age: The upper reference limit for Creatinine is approximately 13% higher for people identified as African-American.      Creatinine, Ser  Date Value Ref Range Status  01/04/2021 0.74 0.44 - 1.00 mg/dL Final         Passed - HBA1C is between 0 and 7.9 and within 180 days    HbA1c, POC (controlled diabetic range)  Date Value Ref Range Status  12/02/2020 6.7 0.0 - 7.0 % Corrected   Hgb A1c MFr Bld  Date Value Ref Range Status  06/03/2021 6.1 (H) 4.8 - 5.6 % Final    Comment:             Prediabetes: 5.7 - 6.4          Diabetes: >6.4          Glycemic control for adults with diabetes: <7.0          Passed - Valid encounter within last 6 months    Recent Outpatient Visits          1 month ago Need for shingles vaccine   [place], Annie Main L, RPH-CPP   1 month ago Type 2 diabetes  mellitus with morbid obesity ([place])   [place] Karle Plumber B, MD   6 months ago Type 2 diabetes mellitus with morbid obesity [facility])   [place] Karle Plumber B, MD   9 months ago Type 2 diabetes mellitus with morbid obesity [facility])   [place] Ladell Pier, MD   1 year ago No-show for appointment   [place], Amy J, NP      Future Appointments            In 2 months Wynetta Emery Dalbert Batman, MD [place]

## 2021-09-09 IMAGING — DX DG CHEST 2V
2 series · 2 of 2 positions shown · non-contrast
Comparison: Chest x-ray 02/08/2018.

CLINICAL DATA: 63-year-old female with history of cough and
congestion. Shortness of breath intermittently for the past 4-5
months.

EXAM:
CHEST - 2 VIEW

[chest pa]
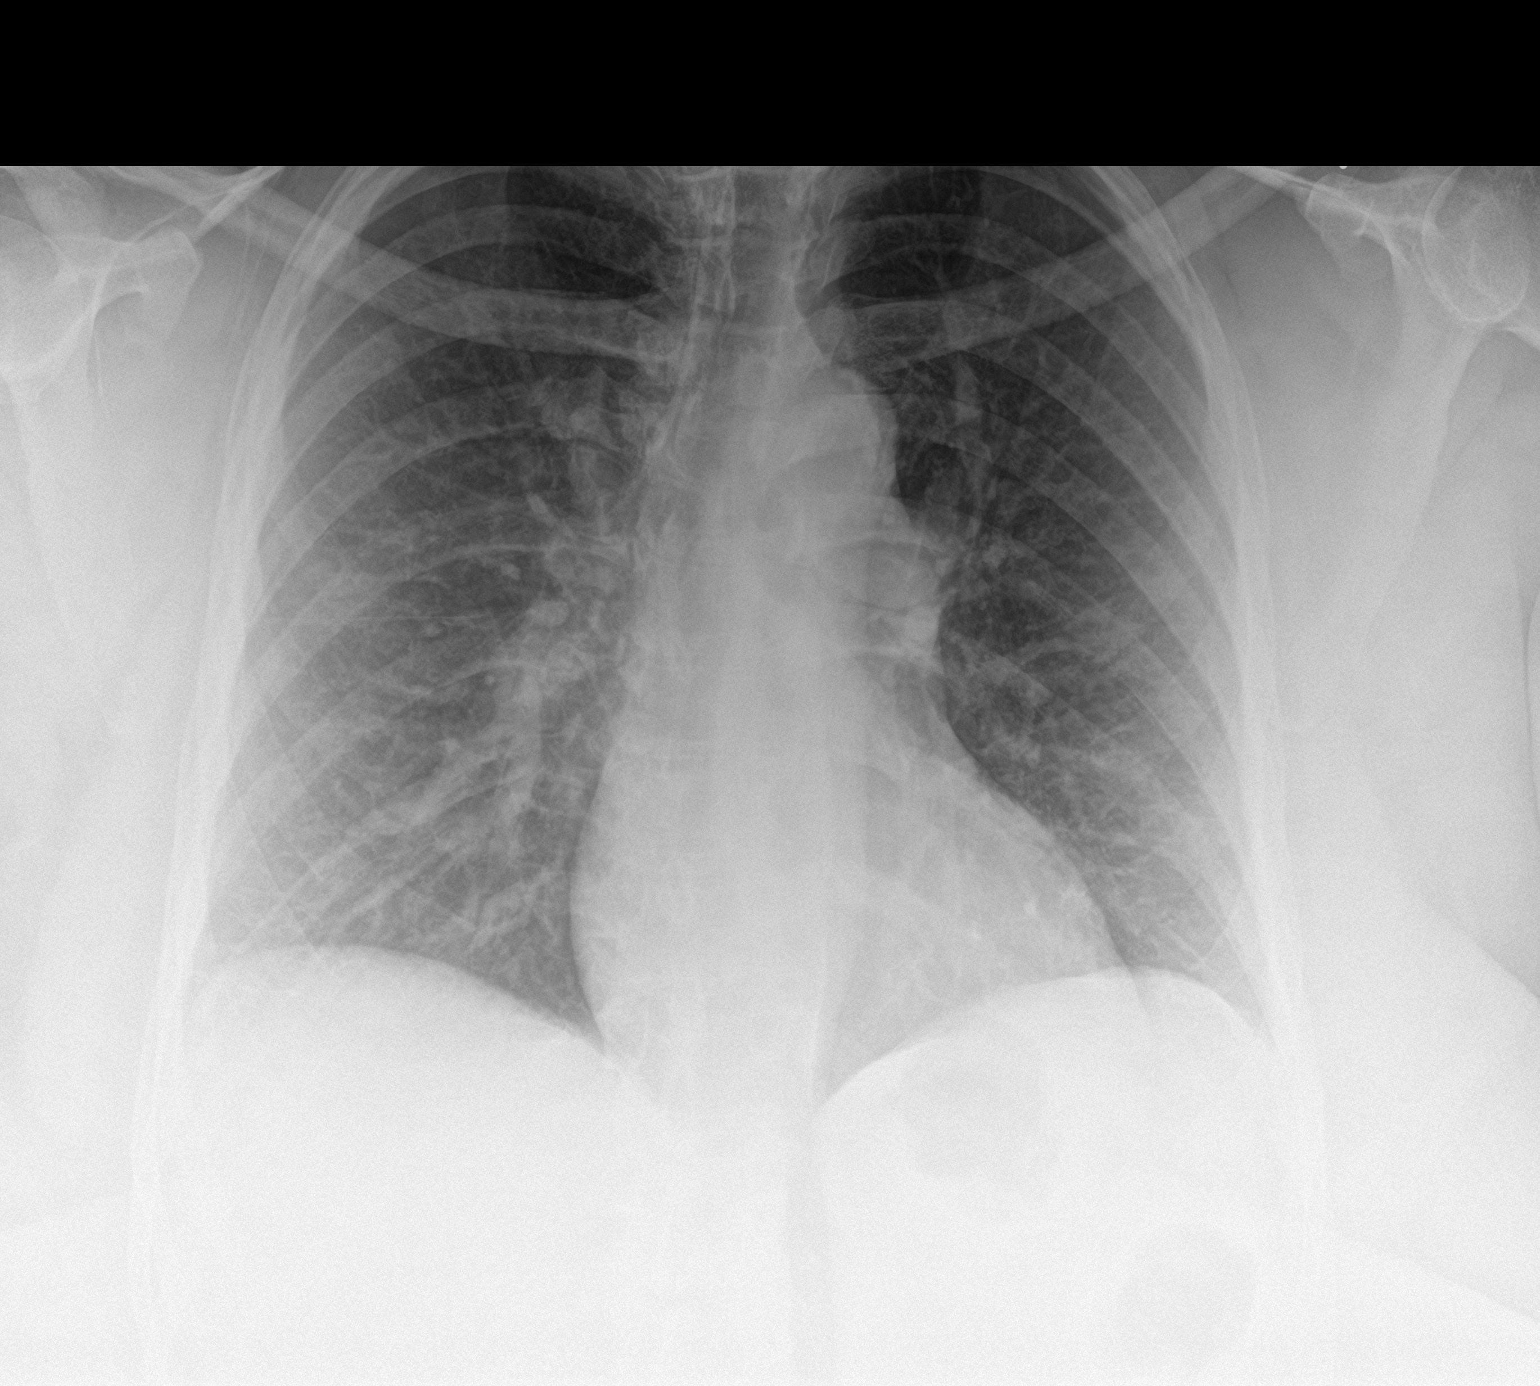

[chest lat]
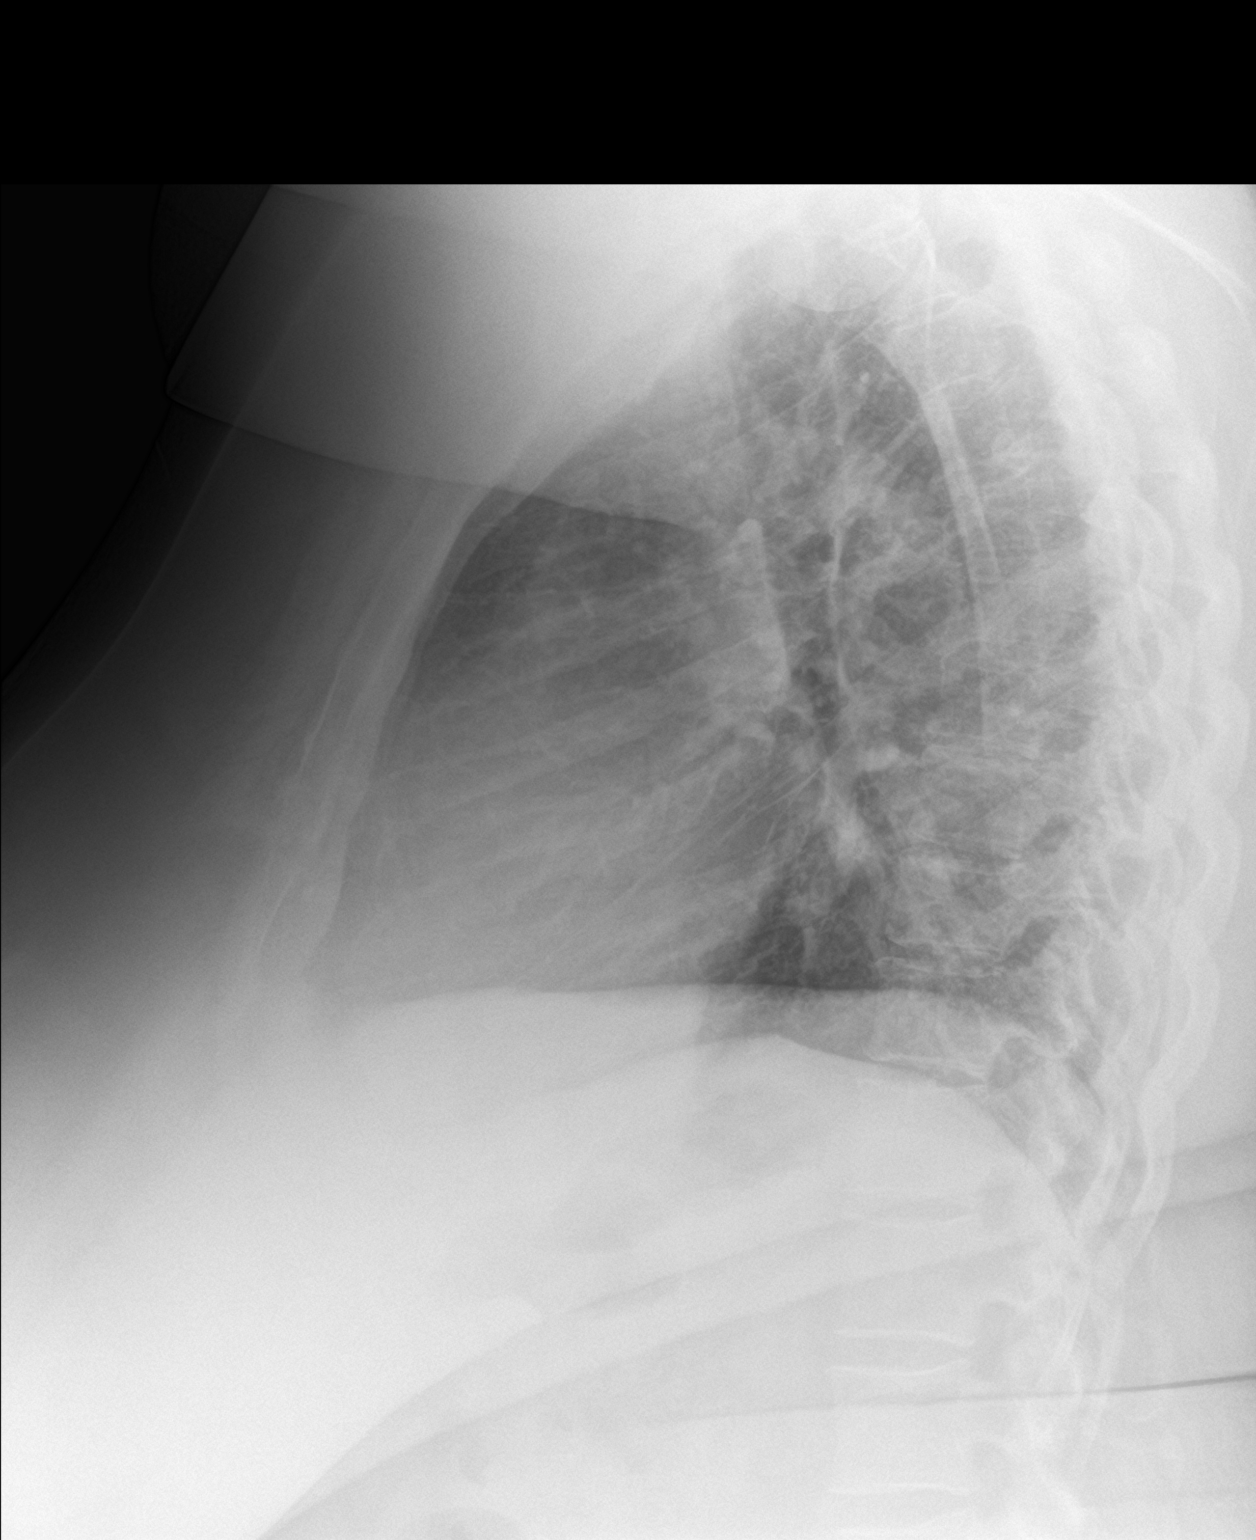

[2 of 2 positions shown; findings below may reference images not displayed]

FINDINGS: Lung volumes are normal. No consolidative airspace disease. No
pleural effusions. No pneumothorax. No pulmonary nodule or mass
noted. Pulmonary vasculature and the cardiomediastinal silhouette
are within normal limits.
IMPRESSION: No radiographic evidence of acute cardiopulmonary disease.

## 2021-09-11 ENCOUNTER — Other Ambulatory Visit: Payer: Self-pay

## 2021-09-18 ENCOUNTER — Encounter: Payer: Medicaid Other | Admitting: Dietician

## 2021-09-18 ENCOUNTER — Encounter: Payer: Self-pay | Admitting: Internal Medicine

## 2021-09-20 ENCOUNTER — Other Ambulatory Visit (INDEPENDENT_AMBULATORY_CARE_PROVIDER_SITE_OTHER): Payer: Self-pay | Admitting: Bariatrics

## 2021-09-20 DIAGNOSIS — E1169 Type 2 diabetes mellitus with other specified complication: Secondary | ICD-10-CM

## 2021-09-22 ENCOUNTER — Other Ambulatory Visit: Payer: Self-pay

## 2021-09-23 ENCOUNTER — Ambulatory Visit (INDEPENDENT_AMBULATORY_CARE_PROVIDER_SITE_OTHER): Payer: Medicaid Other | Admitting: Adult Health

## 2021-09-24 ENCOUNTER — Other Ambulatory Visit: Payer: Self-pay

## 2021-09-30 IMAGING — MG DIGITAL SCREENING BILAT W/ CAD
8 of 9 series · 8 of 9 positions shown · non-contrast
Comparison: Previous exam(s).

CLINICAL DATA: Screening.

EXAM:
DIGITAL SCREENING BILATERAL MAMMOGRAM WITH CAD

[R MLO (1 of 3)]
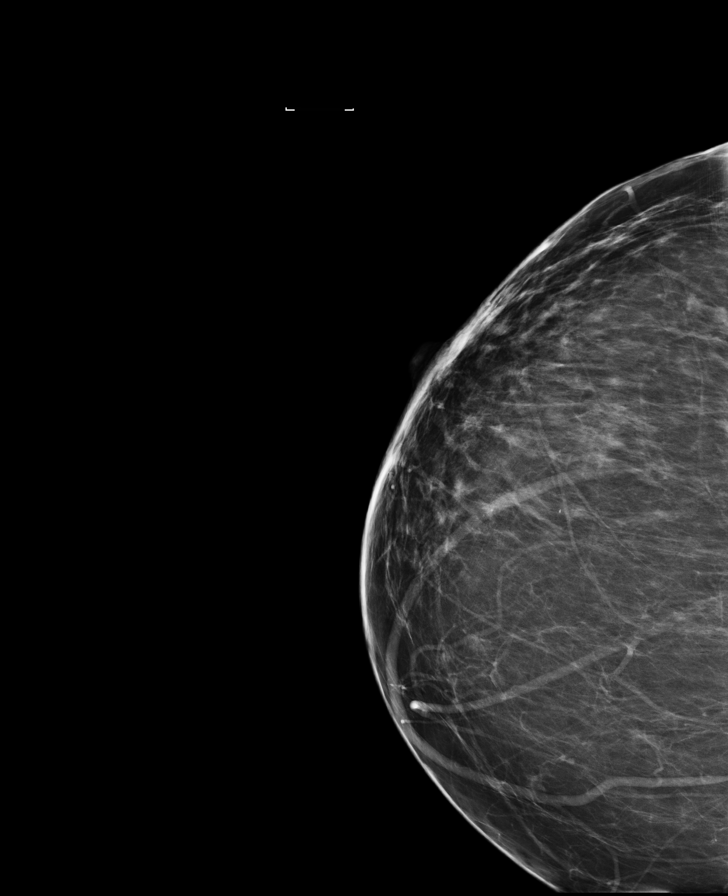

[L CC]
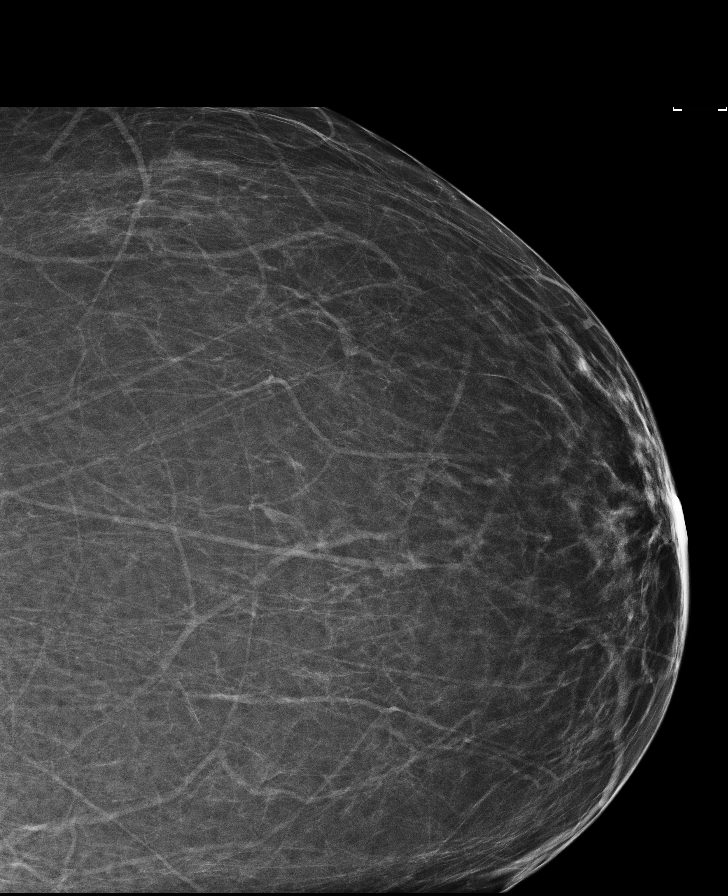

[L MLO (1 of 2)]
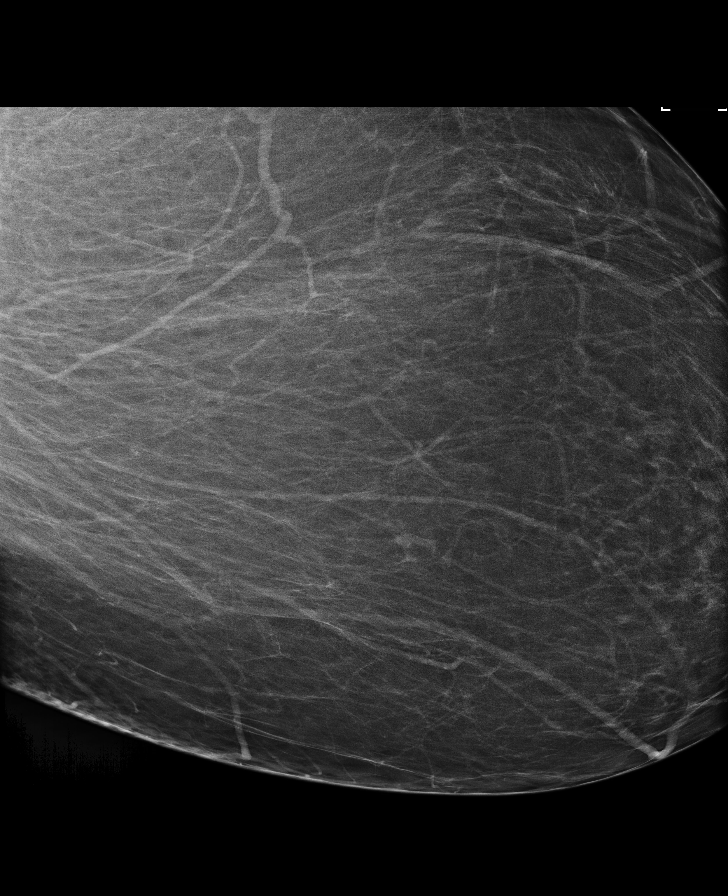

[R MLO (2 of 3)]
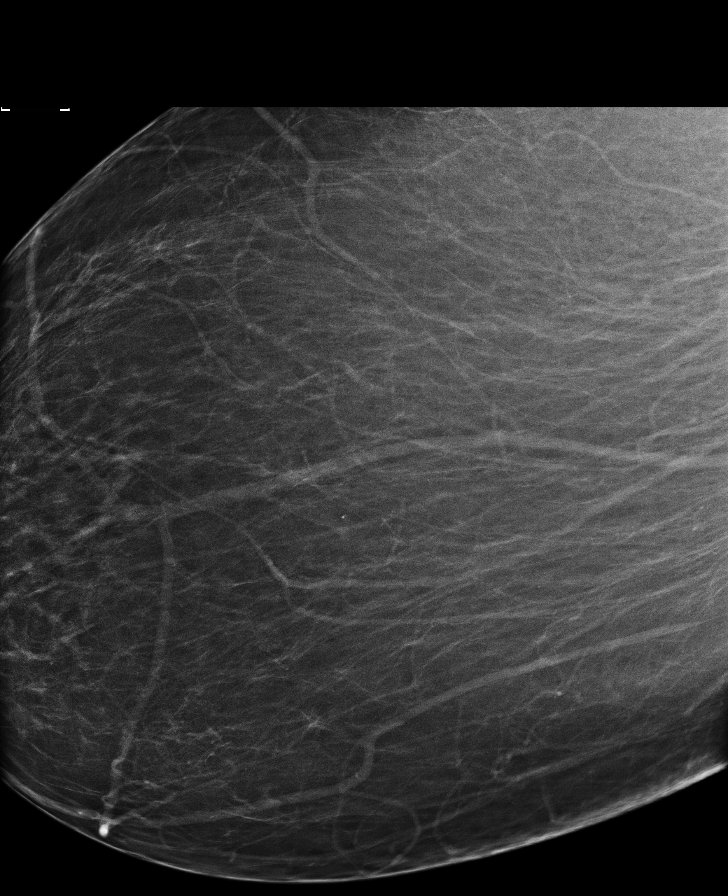

[L CV]
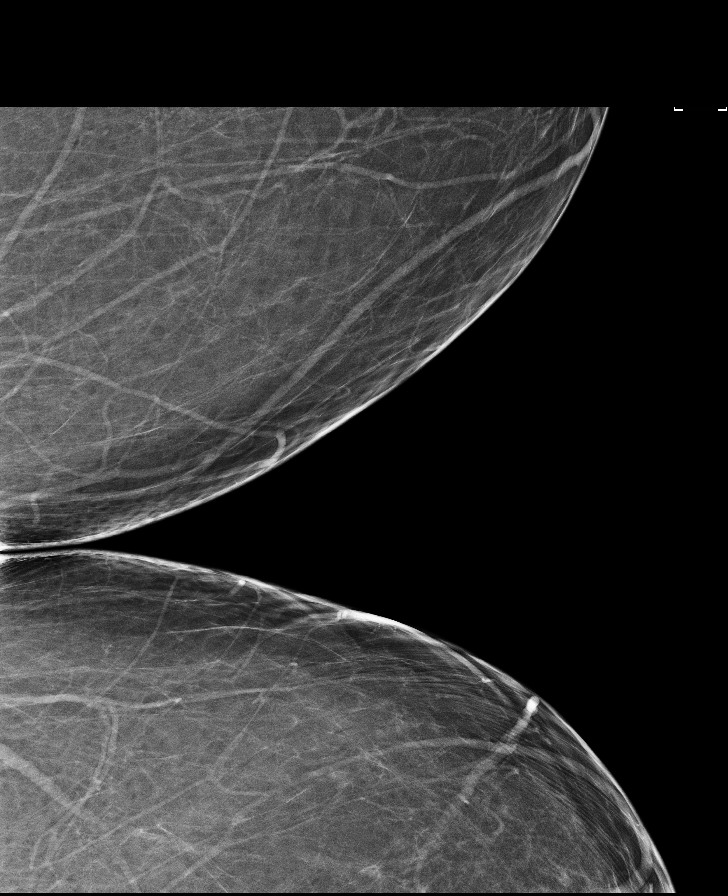

[L MLO (2 of 2)]
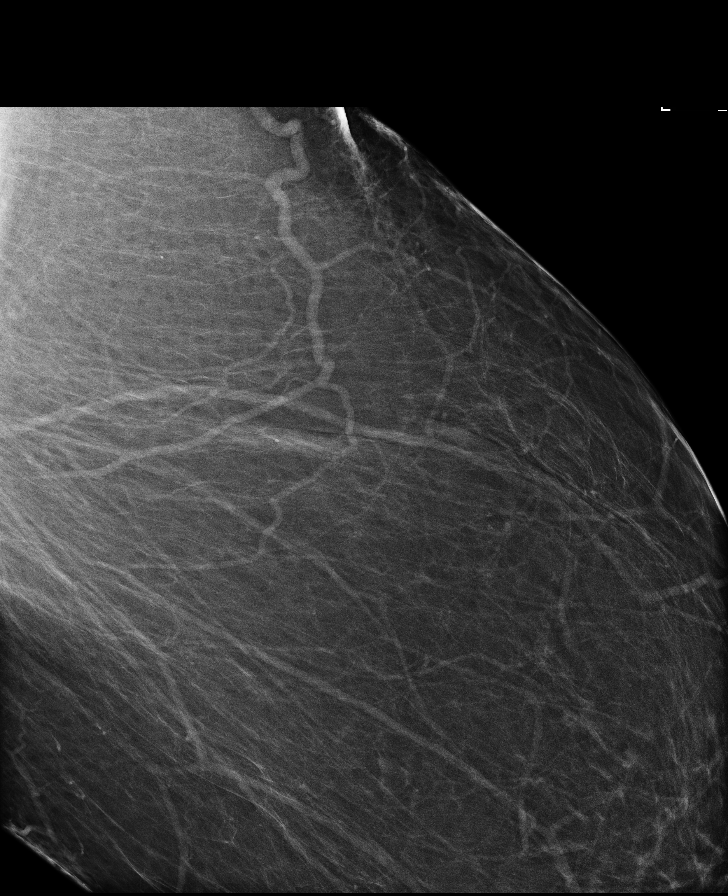

[R CC]
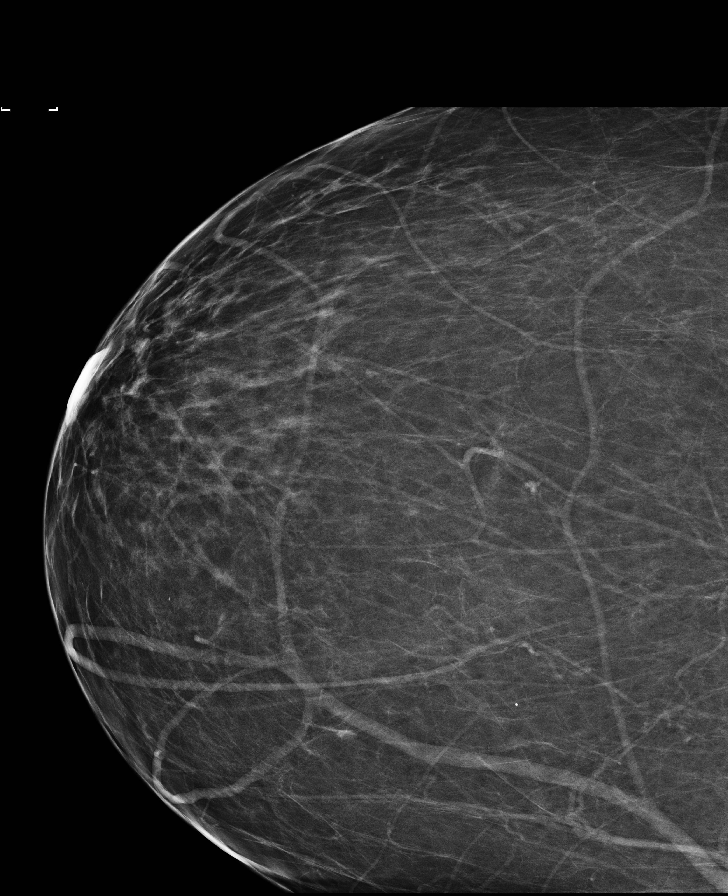

[R MLO (3 of 3)]
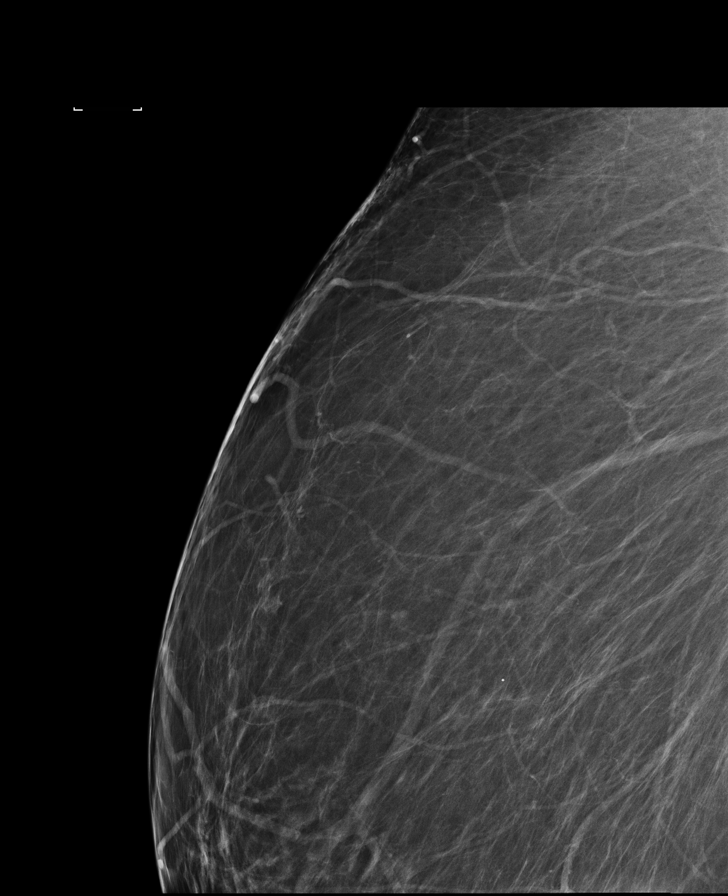

[8 of 9 positions shown; findings below may reference images not displayed]

ACR Breast Density Category b: There are scattered areas of
fibroglandular density.
FINDINGS: There are no findings suspicious for malignancy. Images were
processed with CAD.
IMPRESSION: No mammographic evidence of malignancy. A result letter of this
screening mammogram will be mailed directly to the patient.

RECOMMENDATION:
Screening mammogram in one year. (Code:AS-G-LCT)

BI-RADS CATEGORY  1: Negative.

## 2021-10-01 ENCOUNTER — Other Ambulatory Visit: Payer: Self-pay

## 2021-10-10 ENCOUNTER — Other Ambulatory Visit: Payer: Self-pay

## 2021-10-11 ENCOUNTER — Other Ambulatory Visit: Payer: Self-pay

## 2021-10-14 ENCOUNTER — Ambulatory Visit (INDEPENDENT_AMBULATORY_CARE_PROVIDER_SITE_OTHER): Payer: Medicaid Other | Admitting: Bariatrics

## 2021-10-14 ENCOUNTER — Other Ambulatory Visit: Payer: Self-pay

## 2021-10-14 ENCOUNTER — Encounter (INDEPENDENT_AMBULATORY_CARE_PROVIDER_SITE_OTHER): Payer: Self-pay | Admitting: Bariatrics

## 2021-10-14 VITALS — BP 115/75 | HR 57 | Temp 97.8°F | Ht 68.0 in | Wt 297.0 lb

## 2021-10-14 DIAGNOSIS — E669 Obesity, unspecified: Secondary | ICD-10-CM

## 2021-10-14 DIAGNOSIS — E66813 Obesity, class 3: Secondary | ICD-10-CM

## 2021-10-14 DIAGNOSIS — F5089 Other specified eating disorder: Secondary | ICD-10-CM

## 2021-10-14 DIAGNOSIS — Z6841 Body Mass Index (BMI) 40.0 and over, adult: Secondary | ICD-10-CM

## 2021-10-14 DIAGNOSIS — E1169 Type 2 diabetes mellitus with other specified complication: Secondary | ICD-10-CM | POA: Diagnosis not present

## 2021-10-14 DIAGNOSIS — I10 Essential (primary) hypertension: Secondary | ICD-10-CM

## 2021-10-14 MED ORDER — TOPIRAMATE 25 MG PO TABS: 25.0000 mg | ORAL_TABLET | Freq: Two times a day (BID) | ORAL | 0 refills | Status: DC

## 2021-10-14 MED ORDER — TRULICITY 1.5 MG/0.5ML ~~LOC~~ SOAJ: 1.5000 mg | SUBCUTANEOUS | 0 refills | Status: DC

## 2021-10-14 NOTE — Progress Notes (Signed)
Chief Complaint:   OBESITY Robin [name] is here to discuss her progress with her obesity treatment plan along with follow-up of her obesity related diagnoses. Robin [name] is on the Category 3 Plan and states she is following her eating plan approximately 40% of the time. Robin [name] states she will be more active in walking.  Today's visit was #: 7 Starting weight: 310 Starting date: 06/03/2021 Today's weight: 297 lbs Today's date: 10/14/2021 Total lbs lost to date: 13 lbs Total lbs lost since last in-office visit: 2 lbs  Interim History: Robin [name] is down an additional 2 lbs over the holidays.  Subjective:   1. Diabetes mellitus type 2 in obese [facility]) Robin [name] is taking Trulicity currently. Her appetite is normal.   2. Essential hypertension Zamarah's blood pressure is controlled. Her last blood pressure was 115/75.  3. Other disorder of eating Robin [name] is currently taking Topamax.   Assessment/Plan:   1. Diabetes mellitus type 2 in obese (HCC) We will refill Trulicity 1.5 mg with no refills. Good blood sugar control is important to decrease the likelihood of diabetic complications such as nephropathy, neuropathy, limb loss, blindness, coronary artery disease, and death. Intensive lifestyle modification including diet, exercise and weight loss are the first line of treatment for diabetes.   - Dulaglutide (TRULICITY) 1.5 GH/8.2XH SOPN; Inject 1.5 mg into the skin once a week.  Dispense: 2 mL; Refill: 0  2. Essential hypertension Robin [name] will continue taking her medications. She is working on healthy weight loss and exercise to improve blood pressure control. We will watch for signs of hypotension as she continues her lifestyle modifications.  3. Other disorder of eating We will refill Topamax 25 mg for 1 month with no refills. Behavior modification techniques were discussed today to help Robin [name] deal with her emotional/non-hunger eating behaviors.  Orders and follow up as documented in  patient record.    - topiramate (TOPAMAX) 25 MG tablet; Take 1 tablet (25 mg total) by mouth 2 (two) times daily.  Dispense: 60 tablet; Refill: 0  4. Obesity, current BMI 45.4 [name] is currently in the action stage of change. As such, her goal is to continue with weight loss efforts. She has agreed to the Category 3 Plan.   [name] will continue meal planning and she will continue intentional eating. She will increase her water and protein intake.  Exercise goals:  Robin [name] will continue walking and doing chair exercise.   Behavioral modification strategies: increasing lean protein intake, decreasing simple carbohydrates, increasing vegetables, increasing water intake, decreasing eating out, no skipping meals, meal planning and cooking strategies, keeping healthy foods in the home, and planning for success.  Robin [name] has agreed to follow-up with our clinic in 3 weeks. She was informed of the importance of frequent follow-up visits to maximize her success with intensive lifestyle modifications for her multiple health conditions.   Objective:   Blood pressure 115/75, pulse (!) 57, temperature 97.8 F (36.6 C), height 5\' 8"  (1.727 m), weight 297 lb (134.7 kg), SpO2 98 %. Body mass index is 45.16 kg/m.  General: Cooperative, alert, well developed, in no acute distress. HEENT: Conjunctivae and lids unremarkable. Cardiovascular: Regular rhythm.  Lungs: Normal work of breathing. Neurologic: No focal deficits.   Lab Results  Component Value Date   CREATININE 0.74 01/04/2021   BUN 10 01/04/2021   NA 137 01/04/2021   K 3.5 01/04/2021   CL 100 01/04/2021   CO2 29 01/04/2021   Lab Results  Component Value Date   ALT  35 01/04/2021   AST 32 01/04/2021   ALKPHOS 53 01/04/2021   BILITOT 0.6 01/04/2021   Lab Results  Component Value Date   HGBA1C 6.1 (H) 06/03/2021   HGBA1C 6.7 12/02/2020   HGBA1C 6.7 (H) 02/15/2020   HGBA1C 6.5 (H) 11/13/2019   HGBA1C 5.9 (H) 04/10/2019   Lab  Results  Component Value Date   INSULIN 48.4 (H) 06/03/2021   Lab Results  Component Value Date   TSH 1.296 01/04/2021   Lab Results  Component Value Date   CHOL 121 06/03/2021   HDL 52 06/03/2021   LDLCALC 54 06/03/2021   TRIG 73 06/03/2021   CHOLHDL 2.3 06/03/2021   Lab Results  Component Value Date   VD25OH 61.7 06/03/2021   Lab Results  Component Value Date   WBC 12.0 (H) 01/04/2021   HGB 12.8 01/04/2021   HCT 41.0 01/04/2021   MCV 94.5 01/04/2021   PLT 411 (H) 01/04/2021   Lab Results  Component Value Date   IRON 31 03/04/2015   TIBC 340 03/04/2015   FERRITIN 34 03/04/2015    Attestation Statements:   Reviewed by clinician on day of visit: allergies, medications, problem list, medical history, surgical history, family history, social history, and previous encounter notes.  I, Lizbeth Bark, RMA, am acting as Location manager for [employer], DO.  I have reviewed the above documentation for accuracy and completeness, and I agree with the above. Jearld Lesch, DO

## 2021-10-19 ENCOUNTER — Encounter (INDEPENDENT_AMBULATORY_CARE_PROVIDER_SITE_OTHER): Payer: Self-pay | Admitting: Bariatrics

## 2021-10-21 ENCOUNTER — Other Ambulatory Visit: Payer: Self-pay | Admitting: Physician Assistant

## 2021-10-23 ENCOUNTER — Other Ambulatory Visit: Payer: Self-pay

## 2021-10-27 ENCOUNTER — Other Ambulatory Visit: Payer: Self-pay

## 2021-10-27 ENCOUNTER — Other Ambulatory Visit: Payer: Self-pay | Admitting: Physician Assistant

## 2021-10-28 ENCOUNTER — Other Ambulatory Visit: Payer: Self-pay | Admitting: Physician Assistant

## 2021-10-28 ENCOUNTER — Telehealth: Payer: Self-pay | Admitting: Physician Assistant

## 2021-10-28 MED ORDER — TRAMADOL HCL 50 MG PO TABS: 50.0000 mg | ORAL_TABLET | Freq: Two times a day (BID) | ORAL | 2 refills | Status: DC | PRN

## 2021-10-28 NOTE — Telephone Encounter (Signed)
Patient called needing Rx refilled Tramadol. The number to contact patient is [phone]

## 2021-10-28 NOTE — Telephone Encounter (Signed)
Sent in

## 2021-10-31 ENCOUNTER — Other Ambulatory Visit: Payer: Self-pay

## 2021-10-31 ENCOUNTER — Ambulatory Visit (INDEPENDENT_AMBULATORY_CARE_PROVIDER_SITE_OTHER): Payer: Medicaid Other | Admitting: Clinical

## 2021-10-31 DIAGNOSIS — F341 Dysthymic disorder: Secondary | ICD-10-CM
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
## 2021-11-05 ENCOUNTER — Other Ambulatory Visit: Payer: Self-pay | Admitting: Internal Medicine

## 2021-11-05 DIAGNOSIS — J309 Allergic rhinitis, unspecified: Secondary | ICD-10-CM

## 2021-11-05 NOTE — Telephone Encounter (Signed)
Requested medication (s) are due for refill today:   Yes  Requested medication (s) are on the active medication list:   Yes  Future visit scheduled:   Yes 11/10/2021 with Dr. Wynetta Emery   Last ordered: atorvastatin 03/05/2021 #90, 2 refills;  Singulair 119/2022 #180, 0 refills  Returned because protocol failed due to labs     Requested Prescriptions  Pending Prescriptions Disp Refills   atorvastatin (LIPITOR) 40 MG tablet [Pharmacy Med Name: atorvastatin 40 mg tablet] 90 tablet 2    Sig: TAKE ONE TABLET BY MOUTH EVERY EVENING     Cardiovascular:  Antilipid - Statins Failed - 11/05/2021  8:01 AM      Failed - Lipid Panel in normal range within the last 12 months    Cholesterol, Total  Date Value Ref Range Status  06/03/2021 121 100 - 199 mg/dL Final   LDL Chol Calc (NIH)  Date Value Ref Range Status  06/03/2021 54 0 - 99 mg/dL Final   HDL  Date Value Ref Range Status  06/03/2021 52 >39 mg/dL Final   Triglycerides  Date Value Ref Range Status  06/03/2021 73 0 - 149 mg/dL Final         Passed - Patient is not pregnant      Passed - Valid encounter within last 12 months    Recent Outpatient Visits           3 months ago Need for shingles vaccine   [place], Annie Main L, RPH-CPP   3 months ago Type 2 diabetes mellitus with morbid obesity ([place])   [place] Karle Plumber B, MD   8 months ago Type 2 diabetes mellitus with morbid obesity ([place])   [place] Karle Plumber B, MD   11 months ago Type 2 diabetes mellitus with morbid obesity [facility])   [place], Deborah B, MD   1 year ago No-show for appointment   [place], [place], NP       Future Appointments             In 5 days Ladell Pier, MD [place]             montelukast (SINGULAIR) 10  MG tablet [Pharmacy Med Name: montelukast 10 mg tablet] 90 tablet 2    Sig: TAKE ONE TABLET BY MOUTH EVERY EVENING     Pulmonology:  Leukotriene Inhibitors Passed - 11/05/2021  8:01 AM      Passed - Valid encounter within last 12 months    Recent Outpatient Visits           3 months ago Need for shingles vaccine   [place], Annie Main L, RPH-CPP   3 months ago Type 2 diabetes mellitus with morbid obesity ([place])   [place] Karle Plumber B, MD   8 months ago Type 2 diabetes mellitus with morbid obesity [facility])   [place] Karle Plumber B, MD   11 months ago Type 2 diabetes mellitus with morbid obesity [facility])   [facility] Ladell Pier, MD   1 year ago No-show for appointment   [place], [place], NP       Future Appointments  In 5 days Ladell Pier, MD [place]

## 2021-11-06 ENCOUNTER — Ambulatory Visit: Payer: Medicaid Other | Admitting: Dietician

## 2021-11-06 ENCOUNTER — Other Ambulatory Visit: Payer: Self-pay

## 2021-11-06 ENCOUNTER — Encounter: Payer: Self-pay | Admitting: Internal Medicine

## 2021-11-06 ENCOUNTER — Encounter (HOSPITAL_COMMUNITY): Payer: Self-pay | Admitting: Psychiatry

## 2021-11-06 ENCOUNTER — Telehealth (INDEPENDENT_AMBULATORY_CARE_PROVIDER_SITE_OTHER): Payer: Medicaid Other | Admitting: Psychiatry

## 2021-11-06 DIAGNOSIS — F411 Generalized anxiety disorder: Secondary | ICD-10-CM | POA: Diagnosis not present

## 2021-11-06 DIAGNOSIS — F341 Dysthymic disorder: Secondary | ICD-10-CM

## 2021-11-06 MED ORDER — DULOXETINE HCL 40 MG PO CPEP: 80.0000 mg | ORAL_CAPSULE | Freq: Every day | ORAL | 3 refills | Status: DC

## 2021-11-06 NOTE — Progress Notes (Signed)
[place] MD/PA/NP OP Progress Note Virtual Visit via Telephone Note  I connected with Robin [name] on 11/06/21 at  1:00 PM EST by telephone and verified that I am speaking with the correct person using two identifiers.  Location: Patient: home Provider: Clinic   I discussed the limitations, risks, security and privacy concerns of performing an evaluation and management service by telephone and the availability of in person appointments. I also discussed with the patient that there may be a patient responsible charge related to this service. The patient expressed understanding and agreed to proceed.   I provided 30 minutes of non-face-to-face time during this encounter.  11/06/2021 1:19 PM Robin [name]  MRN:  [id number]  Chief Complaint: "I have been up and down"  HPI: 66 year-old female seen today for follow up psychiatric evaluation. She has psychiatric history of depression, adjustment disorder, and anxiety. She is currently managed on and Cymbalta 60 mg daily. She also takes Gabapentin and Tramadol for chronic joint pain which are managed by her PCP. She notes her medications are somewhat effective in managing her psychiatric conditions.   Today, patient was unable to login virtually so her assessment was done over the phone.  During exam she was pleasant, cooperative, and engaged in conversation.  Patient notes that she been up-and-down.  She reports that she continues to have days where she has crying spells.  She reports that she worries about her children and her marriage.  Patient reports that now her children are older she thinks about the past.  She notes that her current endeavors are not what she thought that they would be.  She reports that she believed that she and her husband would be traveling once the kids were out at home but notes that this has not occurred.    Patient most of the above exacerbates anxiety and depression.  Provider conducted a GAD-7 and he scored 12, at her last  visit she scored a 9.  Provider also conducted PHQ-9 patient scored a 13, her last visit she scored a 10.  She notes that she sleeps 6 to 7 hours nightly.  She endorses having adequate appetite.    Patient notes that she continues to be in pain most days.  She also reports that this worsens her mental health.    Today patient agreeable to increase Cymbalta 60 mg to 80 mg daily to help manage symptoms of anxiety and depression.  She will continue other medications as prescribed.  Patient will follow-up with outpatient counseling for therapy.  No other concerns at this time.        Visit Diagnosis:    ICD-10-CM   1. Persistent depressive disorder  F34.1 DULoxetine 40 MG CPEP    2. Generalized anxiety disorder  F41.1 DULoxetine 40 MG CPEP      Past Psychiatric History: depression, adjustment disorder, and anxiety  Past Medical History:  Past Medical History:  Diagnosis Date   Anxiety disorder    Arthritis    Asthma    Back pain    Chronic neck pain    Constipation    Depression    Diabetes mellitus without complication (HCC)    Dyspnea    Edema of lower extremity    Fibromyalgia    GERD (gastroesophageal reflux disease)    Heartburn    Hiatal hernia    small   Hyperlipidemia    Hypertension    Hypothyroidism    Joint pain    Morbid obesity  with BMI of 50.0-59.9, adult (HCC)    Osteoarthritis    Other fatigue    Schatzki's ring    non critical   Sinusitis    Sleep apnea    CPAP, Sleep study at [place]   SOB (shortness of breath) on exertion    Swallowing difficulty     Past Surgical History:  Procedure Laterality Date   ABDOMINAL HYSTERECTOMY  02/18/2000   CARDIAC CATHETERIZATION  08/18/2003   normal L main/LAD/L Cfx/RCA (Dr. Adora Fridge)   COLONOSCOPY  2006   Dr. [name], hyperplastic polyps   COLONOSCOPY  08/06/2011   abnormal terminal ileum for 10cm, erosions, geographical ulceration. Bx small bowel mucosa with prominent intramucosal  lymphoid aggregates, slightly inflammed   COLONOSCOPY WITH PROPOFOL N/A 12/28/2019   [place]: 4 sessile serrated adenomas removed on the colon.  Next colonoscopy in 3 years.   DIRECT LARYNGOSCOPY  03/15/2012   Procedure: DIRECT LARYNGOSCOPY;  Surgeon: [name], MD;  Location: [place];  Service: ENT;  Laterality: N/A; Dr. Wolicki--:>no foreign body seen. normal esophagus to 40cm   ESOPHAGEAL DILATION  04/17/2015   Procedure: ESOPHAGEAL DILATION;  Surgeon: [name], MD;  [facility];  Service: Endoscopy;;   ESOPHAGOGASTRODUODENOSCOPY  08/23/2008   QHU:TMLY distal esophageal erosions consistent with mild erosive reflux esophagitis, otherwise unremarkable esophagus/ Tiny antral erosions of doubtful clinical significance, otherwise normal stomach, patent pylorus, normal D1 and D2   ESOPHAGOGASTRODUODENOSCOPY  08/06/2011   small hh, noncritical Schatzki's ring s/p 39 F   ESOPHAGOGASTRODUODENOSCOPY N/A 04/17/2015   YTK:PTWSFK s/p dilation   ESOPHAGOGASTRODUODENOSCOPY (EGD) WITH PROPOFOL N/A 01/20/2018   Dr. [name]: Erosive gastropathy, normal-appearing esophagus status post empiric dilation.  Chronic gastritis, no H. pylori.   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, surgeon at [facility]   POLYPECTOMY  12/28/2019   Procedure: POLYPECTOMY;  Surgeon: [name], MD;  [facility];  Service: Endoscopy;;   RECTOCELE REPAIR     TRANSTHORACIC ECHOCARDIOGRAM   2003   EF normal   VAGINAL PROLAPSE REPAIR     VIDEO BRONCHOSCOPY Bilateral 03/08/2017   Procedure: VIDEO BRONCHOSCOPY WITHOUT FLUORO;  Surgeon: Javier Glazier, MD;  Location: [place];  Service: Cardiopulmonary;  Laterality: Bilateral;    Family Psychiatric History: Brother depression and alcohol use  Family History:  Family History  Problem Relation Age of Onset   Diabetes Mother    Hypertension Mother    Heart Problems Mother    Hyperlipidemia Mother    Asthma Mother    Sleep apnea Mother    Obesity Mother    Diabetes Father    Kidney cancer Father    Hypertension Father    Hyperlipidemia Father    Sleep apnea Father    Hypertension Sister    Allergic rhinitis Sister    Hypertension Sister    Hyperlipidemia Sister    Liver disease Brother    Arthritis Brother  Hypertension Brother    Hyperlipidemia Brother    Hypertension Brother    Breast cancer Maternal Grandmother    Kidney disease Maternal Grandfather    Breast cancer Paternal Grandmother    Eczema Daughter    Lupus Maternal Aunt    Stomach cancer Other        aunt   Colon cancer Neg Hx    Immunodeficiency Neg Hx    Urticaria Neg Hx     Social History:  Social History   Socioeconomic History   Marital status: Married    Spouse name: Not on file   Number of children: 4   Years of education: 10   Highest education level: Not on file  Occupational History    Employer: UNEMPLOYED  Tobacco Use   Smoking status: Former    Packs/day: 0.50    Years: 10.00    Pack years: 5.00    Types: Cigarettes    Start date: 07/14/1975    Quit date: 04/04/1993    Years since quitting: 28.6   Smokeless tobacco: Never   Tobacco comments:    quit 20 yrs ago  Vaping Use   Vaping Use: Never used  Substance and Sexual Activity   Alcohol use: No    Alcohol/week: 0.0 standard drinks   Drug use: No   Sexual activity: Yes  Other Topics Concern   Not on file  Social History Narrative   Lives with husband in  an apartment on the first floor.  Has 4 children.     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  Trying to get disability.  Formerly worked as a Teacher, early years/pre.  Education: 11th grade.      [facility] (12/23/16):   Originally from [facility]. She was raised in [place]. 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 No pets currently. No bird exposure. She does have mold in her current home in the bathroom, laundry room, & master bedroom.    Social Determinants of Health   Financial Resource Strain: Not on file  Food Insecurity: Not on file  Transportation Needs: Not on file  Physical Activity: Not on file  Stress: Not on file  Social Connections: Not on file    Allergies:  Allergies  Allergen Reactions   Amlodipine Besylate Other (See Comments)    Edema in lower extremities Other reaction(s): Other Edema in lower extremities   Celexa [Citalopram Hydrobromide] Other (See Comments)    "Made me feel out of it"   Gabapentin Other (See Comments)    Contributed to lower extremity edema   Diflucan [Fluconazole] Rash    Metabolic Disorder Labs: Lab Results  Component Value Date   HGBA1C 6.1 (H) 06/03/2021   MPG 114 03/04/2015   No results found for: PROLACTIN Lab Results  Component Value Date   CHOL 121 06/03/2021   TRIG 73 06/03/2021   HDL 52 06/03/2021   CHOLHDL 2.3 06/03/2021   VLDL 13 12/16/2015   LDLCALC 54 06/03/2021   LDLCALC 71 10/25/2019   Lab Results  Component Value Date   TSH 1.296 01/04/2021   TSH 1.697 07/29/2020    Therapeutic Level Labs: No results found for: LITHIUM No results found for: VALPROATE No components found for:  CBMZ  Current Medications: Current Outpatient Medications  Medication Sig Dispense Refill  ACCU-CHEK AVIVA PLUS test strip USE AS DIRECTED 100 strip 3   acetaminophen (TYLENOL)  500 MG tablet Take 1,000 mg by mouth 2 (two) times daily.      aspirin EC 81 MG tablet Take 81 mg by mouth at bedtime.     atorvastatin (LIPITOR) 40 MG tablet Take 1 tablet (40 mg total) by mouth daily. 90 tablet 2   azelastine (ASTELIN) 0.1 % nasal spray 1-2 sprays per nostril 2 times daily as needed 30 mL 12   CALCIUM CARBONATE ANTACID PO Take 2 tablets by mouth See admin instructions. Take 2 tablets by mouth every morning, may also take 2 tablets at night as needed for acid reflux     celecoxib (CELEBREX) 100 MG capsule TAKE 1 CAPSULE (100 MG TOTAL) BY MOUTH 2 (TWO) TIMES DAILY. 180 capsule 2   CINNAMON PO Take 1 capsule by mouth daily.     conjugated estrogens (PREMARIN) vaginal cream Place 1 Applicatorful vaginally daily as needed (vaginal dryness/itching).     [employer] MAGNESIUM OXIDE 250 MG TABS Take 1 tablet by mouth daily.     Dulaglutide (TRULICITY) 1.5 AY/3.0ZS SOPN Inject 1.5 mg into the skin once a week. 2 mL 0   DULoxetine 40 MG CPEP Take 80 mg by mouth daily. 60 capsule 3   esomeprazole (NEXIUM) 40 MG capsule TAKE ONE CAPSULE BY MOUTH EVERY MORNING and TAKE ONE CAPSULE BY MOUTH EVERY EVENING 60 capsule 5   fluticasone (FLONASE) 50 MCG/ACT nasal spray Place 1 spray into both nostrils daily. 16 g 1   fluticasone (FLOVENT HFA) 220 MCG/ACT inhaler INHALE 2 INHALATIONS TWICE DAILY (Patient not taking: Reported on 06/03/2021) 12 g 12   furosemide (LASIX) 40 MG tablet TAKE 1/2 TO 1 TABLET BY MOUTH DAILY FOR LOWER EXTREMITY SWELLING 90 tablet 2   gabapentin (NEURONTIN) 600 MG tablet 1 tab po Q a.m and afternoon and 1 1/2 tab PO Q p.m 315 tablet 2   glucose blood test strip USE AS INSTRUCTED     glucose blood test strip USE AS INSTRUCTED     hydrALAZINE (APRESOLINE) 50 MG tablet Take 1 tablet (50 mg total) by mouth 2 (two) times daily. 270 tablet 3   hydrochlorothiazide (HYDRODIURIL) 25 MG tablet Take 1 tablet (25 mg total) by mouth daily. 90 tablet 3   hydrOXYzine (ATARAX/VISTARIL) 25 MG tablet  Take 25 mg by mouth daily as needed.     levothyroxine (SYNTHROID) 88 MCG tablet TAKE 1 TABLET (88 MCG TOTAL) BY MOUTH DAILY. 90 tablet 1   linaclotide (LINZESS) 290 MCG CAPS capsule TAKE 1 CAPSULE BY MOUTH DAILY BEFORE BREAKFAST. 90 capsule 3   losartan (COZAAR) 25 MG tablet Take 1 tablet (25 mg total) by mouth daily. 90 tablet 2   metFORMIN (GLUCOPHAGE) 500 MG tablet TAKE TWO TABLETS BY MOUTH EVERY MORNING and TAKE ONE TABLET EVERY EVENING 270 tablet 0   Metoprolol Tartrate 75 MG TABS TAKE ONE TABLET BY MOUTH EVERY MORNING and TAKE ONE TABLET BY MOUTH EVERY EVENING 180 tablet 0   montelukast (SINGULAIR) 10 MG tablet TAKE 1 TABLET BY MOUTH AT BEDTIME. 90 tablet 2   Multiple Vitamin (MULTIVITAMIN WITH MINERALS) TABS tablet Take 1 tablet by mouth daily.     NARCAN 4 MG/0.1ML LIQD nasal spray kit      Omega-3 Fatty Acids (FISH OIL) 1000 MG CAPS Take 1,000 mg by mouth at bedtime.     spironolactone (ALDACTONE) 25 MG tablet TAKE ONE TABLET BY MOUTH EVERY MORNING 90  tablet 0   topiramate (TOPAMAX) 25 MG tablet Take 1 tablet (25 mg total) by mouth 2 (two) times daily. 60 tablet 0   traMADol (ULTRAM) 50 MG tablet Take 1 tablet (50 mg total) by mouth 2 (two) times daily as needed. 60 tablet 2   VENTOLIN HFA 108 (90 Base) MCG/ACT inhaler INHALE 1-2 PUFFS into THE lungs every SIX hours AS NEEDED FOR WHEEZING OR FOR SHORTNESS OF BREATH 8.5 g 6   vitamin B-12 (CYANOCOBALAMIN) 1000 MCG tablet Take 1,000 mcg by mouth daily.     Vitamin D, Ergocalciferol, (DRISDOL) 1.25 MG (50000 UNIT) CAPS capsule Take 1 capsule (50,000 Units total) by mouth once a week. 12 capsule 0   No current facility-administered medications for this visit.     Musculoskeletal: Strength & Muscle Tone:  Unable to assess due to telephone visit Gait & Station:  Unable to assess due to telephone visit Patient leans: N/A  Psychiatric Specialty Exam: Review of Systems  There were no vitals taken for this visit.There is no height or  weight on file to calculate BMI.  General Appearance:  Unable to assess due to telephone visit  Eye Contact:   Unable to assess due to telephone visit  Speech:  Clear and Coherent and Normal Rate  Volume:  Normal  Mood:  Anxious and Depressed  Affect:  Appropriate and Congruent  Thought Process:  Coherent, Goal Directed, and Linear  Orientation:  Full (Time, Place, and Person)  Thought Content: WDL and Logical   Suicidal Thoughts:  No  Homicidal Thoughts:  No  Memory:  Immediate;   Good Recent;   Good Remote;   Good  Judgement:  Good  Insight:  Good  Psychomotor Activity:   Unable to assess due to telephone visit  Concentration:  Concentration: Good and Attention Span: Good  Recall:  Good  Fund of Knowledge: Good  Language: Good  Akathisia:   Unable to assess due to telephone visit  Handed:  Right  AIMS (if indicated): not done  Assets:  Communication Skills Desire for Improvement Financial Resources/Insurance Housing Intimacy Leisure Time Physical Health Social Support  ADL's:  Intact  Cognition: WNL  Sleep:  Good   Screenings: GAD-7    Flowsheet Row Video Visit from 11/06/2021 in [facility] Video Visit from 08/14/2021 in [facility] Office Visit from 07/10/2021 in [place] Office Visit from 05/15/2021 in [facility] Office Visit from 02/27/2021 in [place]  Total GAD-7 Score $RemoveBef'12 9 13 16 11      'skAhcTplet$ PHQ2-9    Flowsheet Row Video Visit from 11/06/2021 in [facility] Counselor from 09/04/2021 in [facility] Video Visit from 08/14/2021 in [facility] Office Visit from 07/10/2021 in [place] Visit from 06/03/2021 in [place]  PHQ-2 Total Score $RemoveBef'5 4 4 4 5  'FSoJIMIUrL$ PHQ-9 Total Score $RemoveBef'13 10 10 13 14       'KuTsxcMhXL$ Flowsheet Row Counselor from 09/04/2021 in [facility] Office Visit from 05/15/2021 in [facility] Counselor from 04/30/2021 in [place] No Risk No Risk No Risk        Assessment and Plan: Patient endorse increased anxiety and depression due to life stressor. Today patient agreeable to increase Cymbalta 60 mg to 80 mg  daily to help manage symptoms of anxiety and depression.  She will continue other medications as prescribed.    1. Persistent depressive disorder  Increased- DULoxetine 40 MG CPEP; Take 80 mg by mouth daily.  Dispense: 60 capsule; Refill: 3  2. Generalized anxiety disorder  Increased- DULoxetine 40 MG CPEP; Take 80 mg by mouth daily.  Dispense: 60 capsule; Refill: 3  Follow-up in 3 months Follow-up with therapy Salley Slaughter, NP 11/06/2021, 1:19 PM

## 2021-11-10 ENCOUNTER — Other Ambulatory Visit: Payer: Self-pay

## 2021-11-10 ENCOUNTER — Other Ambulatory Visit (INDEPENDENT_AMBULATORY_CARE_PROVIDER_SITE_OTHER): Payer: Self-pay | Admitting: Bariatrics

## 2021-11-10 ENCOUNTER — Other Ambulatory Visit: Payer: Self-pay | Admitting: Internal Medicine

## 2021-11-10 ENCOUNTER — Ambulatory Visit: Payer: Medicaid Other | Attending: Internal Medicine | Admitting: Internal Medicine

## 2021-11-10 ENCOUNTER — Encounter: Payer: Self-pay | Admitting: Internal Medicine

## 2021-11-10 DIAGNOSIS — E039 Hypothyroidism, unspecified: Secondary | ICD-10-CM | POA: Diagnosis not present

## 2021-11-10 DIAGNOSIS — Z7984 Long term (current) use of oral hypoglycemic drugs: Secondary | ICD-10-CM | POA: Diagnosis not present

## 2021-11-10 DIAGNOSIS — M170 Bilateral primary osteoarthritis of knee: Secondary | ICD-10-CM | POA: Diagnosis not present

## 2021-11-10 DIAGNOSIS — K219 Gastro-esophageal reflux disease without esophagitis: Secondary | ICD-10-CM | POA: Insufficient documentation

## 2021-11-10 DIAGNOSIS — G4733 Obstructive sleep apnea (adult) (pediatric): Secondary | ICD-10-CM | POA: Insufficient documentation

## 2021-11-10 DIAGNOSIS — M158 Other polyosteoarthritis: Secondary | ICD-10-CM | POA: Diagnosis not present

## 2021-11-10 DIAGNOSIS — E063 Autoimmune thyroiditis: Secondary | ICD-10-CM | POA: Insufficient documentation

## 2021-11-10 DIAGNOSIS — M19041 Primary osteoarthritis, right hand: Secondary | ICD-10-CM | POA: Insufficient documentation

## 2021-11-10 DIAGNOSIS — M19011 Primary osteoarthritis, right shoulder: Secondary | ICD-10-CM | POA: Diagnosis not present

## 2021-11-10 DIAGNOSIS — M19042 Primary osteoarthritis, left hand: Secondary | ICD-10-CM | POA: Diagnosis not present

## 2021-11-10 DIAGNOSIS — M160 Bilateral primary osteoarthritis of hip: Secondary | ICD-10-CM | POA: Insufficient documentation

## 2021-11-10 DIAGNOSIS — I10 Essential (primary) hypertension: Secondary | ICD-10-CM | POA: Insufficient documentation

## 2021-11-10 DIAGNOSIS — Z7901 Long term (current) use of anticoagulants: Secondary | ICD-10-CM | POA: Insufficient documentation

## 2021-11-10 DIAGNOSIS — F333 Major depressive disorder, recurrent, severe with psychotic symptoms: Secondary | ICD-10-CM | POA: Diagnosis not present

## 2021-11-10 DIAGNOSIS — Z6841 Body Mass Index (BMI) 40.0 and over, adult: Secondary | ICD-10-CM | POA: Insufficient documentation

## 2021-11-10 DIAGNOSIS — M19012 Primary osteoarthritis, left shoulder: Secondary | ICD-10-CM | POA: Diagnosis not present

## 2021-11-10 DIAGNOSIS — E1169 Type 2 diabetes mellitus with other specified complication: Secondary | ICD-10-CM | POA: Insufficient documentation

## 2021-11-10 DIAGNOSIS — M797 Fibromyalgia: Secondary | ICD-10-CM | POA: Diagnosis not present

## 2021-11-10 DIAGNOSIS — Z79899 Other long term (current) drug therapy: Secondary | ICD-10-CM | POA: Insufficient documentation

## 2021-11-10 DIAGNOSIS — F321 Major depressive disorder, single episode, moderate: Secondary | ICD-10-CM

## 2021-11-10 DIAGNOSIS — E1159 Type 2 diabetes mellitus with other circulatory complications: Secondary | ICD-10-CM | POA: Diagnosis present

## 2021-11-10 DIAGNOSIS — F5089 Other specified eating disorder: Secondary | ICD-10-CM

## 2021-11-10 DIAGNOSIS — E669 Obesity, unspecified: Secondary | ICD-10-CM

## 2021-11-10 DIAGNOSIS — I152 Hypertension secondary to endocrine disorders: Secondary | ICD-10-CM

## 2021-11-10 LAB — POCT GLYCOSYLATED HEMOGLOBIN (HGB A1C): HbA1c, POC (controlled diabetic range): 5.7 % (ref 0.0–7.0)

## 2021-11-10 MED ORDER — METFORMIN HCL 500 MG PO TABS: ORAL_TABLET | ORAL | 2 refills | Status: DC

## 2021-11-10 MED ORDER — METOPROLOL TARTRATE 75 MG PO TABS: ORAL_TABLET | ORAL | 2 refills | Status: DC

## 2021-11-10 MED ORDER — SPIRONOLACTONE 25 MG PO TABS: 25.0000 mg | ORAL_TABLET | Freq: Every morning | ORAL | 2 refills | Status: DC

## 2021-11-10 NOTE — Progress Notes (Signed)
Patient ID: Robin [name], female    DOB: 03-Aug-1956  MRN: [id number]  CC: Diabetes and Hypertension   Subjective: Robin [name] is a 66 y.o. female who presents for chronic disease management Her concerns today include:  Patient with history of HTN, DM, OSA, fibromyalgia, depression, obesity,  GERD, hypothyroid, OA of multiple js (hands, knees, shoulders, hips), pituitary adenoma status post resection.   Patient [facility]  I was able to do complete medication reconciliation.  Obesity/DM:   Results for orders placed or performed in visit on 11/10/21  POCT glycosylated hemoglobin (Hb A1C)  Result Value Ref Range   Hemoglobin A1C     HbA1c POC (<> result, manual entry)     HbA1c, POC (prediabetic range)     HbA1c, POC (controlled diabetic range) 5.7 0.0 - 7.0 %   *Note: Due to a large number of results and/or encounters for the requested time period, some results have not been displayed. A complete set of results can be found in Results Review.    down 6 lbs since last visit with me.  Down 47 lbs since 11/2020. Still on Trulicity and tolerating it without vomiting.  Also on Metformin 500 mg 2 a.m/one in p.m.  Eating smaller portions -working with physical therapist once a wk to help increase her mobility. Has f/u with [facility] 11/14/2021 -uses motorized chair only when in public.  Uses her walker at home.  On Tramadol and Celebrex.  On the tramadol through her orthopedics.  Request completion of form for her to renew her handicap sticker for her vehicle.  HTN:  checking BP at home daily.  Gives range 120-135/60s.  Highest SBP is 140 Limits salt in foods Meds:  reports compliance with Furosemide, Metoprolol, Spironolactone, HCTZ, Cozaar and Hydralazine  HL:  taking and tolerating Lipitor 40 mg daily.  MDD:  followed by NP Maggie Font.  Reports her depression still fluctuates.  Cymbalta recently increased to 80 mg last  wk  Hypothyroid: Taking and tolerating levothyroxine.  HM:  due for COVID booster.  Patient Active Problem List   Diagnosis Date Noted   Adjustment disorder with mixed disturbance of emotions and conduct 07/28/2020   Polyp of descending colon 02/15/2020   Pituitary tumor 08/21/2019   Primary osteoarthritis of both hips 08/21/2019   Primary osteoarthritis of both knees 08/21/2019   Pain in left shoulder 08/21/2019   Morbid obesity with BMI of 60.0-69.9, adult ([place]) 07/17/2019   Pedal edema 07/17/2019   Multilevel degenerative disc disease 10/31/2018   Abdominal pain 10/28/2018   Change in bowel function 10/28/2018   Severe episode of recurrent major depressive disorder, with psychotic features ([place]) 05/26/2018   Gastritis and gastroduodenitis 04/26/2018   Atrophic vaginitis 04/01/2018   Diabetes mellitus ([place]) 03/31/2018   Cough, persistent 02/08/2018   Dysphagia 12/14/2017   Generalized OA 07/30/2017   Urinary incontinence 07/23/2017   Early satiety 06/16/2017   Hx of iron deficiency anemia 05/28/2017   Throat pain in adult 04/05/2017   Prediabetes 02/04/2017   Dyspnea on exertion 12/23/2016   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 12/23/2016   Hemoptysis 12/23/2016   Fibromyalgia 08/18/2016   Chronic lymphocytic thyroiditis 08/18/2016   Depression 04/01/2016   OSA (obstructive sleep apnea) 04/01/2016   Essential hypertension 12/09/2015   Hypothyroidism 12/09/2015   Morbid obesity due to excess calories ([place]) 12/09/2015   Constipation 05/27/2015   Fatty liver 05/27/2015  Abdominal pain, chronic, epigastric 07/04/2012   Anxiety 06/09/2012   History of gastroesophageal reflux (GERD) 06/09/2012   Hyperlipidemia 06/09/2012   Refusal of blood transfusions as patient is Jehovah's Witness 06/09/2012   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      aspirin EC 81 MG tablet Take 81 mg by mouth at bedtime.     atorvastatin (LIPITOR) 40 MG tablet Take 1 tablet (40 mg total) by mouth daily. 90 tablet 2   azelastine (ASTELIN) 0.1 % nasal spray 1-2 sprays per nostril 2 times daily as needed 30 mL 12   CALCIUM CARBONATE ANTACID PO Take 2 tablets by mouth See admin instructions. Take 2 tablets by mouth every morning, may also take 2 tablets at night as needed for acid reflux     celecoxib (CELEBREX) 100 MG capsule TAKE 1 CAPSULE (100 MG TOTAL) BY MOUTH 2 (TWO) TIMES DAILY. 180 capsule 2   CINNAMON PO Take 1 capsule by mouth daily.     conjugated estrogens (PREMARIN) vaginal cream Place 1 Applicatorful vaginally daily as needed (vaginal dryness/itching).     [employer] MAGNESIUM OXIDE 250 MG TABS Take 1 tablet by mouth daily.     Dulaglutide (TRULICITY) 1.5 YH/0.6CB SOPN Inject 1.5 mg into the skin once a week. 2 mL 0   DULoxetine 40 MG CPEP Take 80 mg by mouth daily. 60 capsule 3   esomeprazole (NEXIUM) 40 MG capsule TAKE ONE CAPSULE BY MOUTH EVERY MORNING and TAKE ONE CAPSULE BY MOUTH EVERY EVENING 60 capsule 5   fluticasone (FLONASE) 50 MCG/ACT nasal spray Place 1 spray into both nostrils daily. 16 g 1   fluticasone (FLOVENT HFA) 220 MCG/ACT inhaler INHALE 2 INHALATIONS TWICE DAILY (Patient not taking: Reported on 06/03/2021) 12 g 12   furosemide (LASIX) 40 MG tablet TAKE 1/2 TO 1 TABLET BY MOUTH DAILY FOR LOWER EXTREMITY SWELLING 90 tablet 2   gabapentin (NEURONTIN) 600 MG tablet 1 tab po Q a.m and afternoon and 1 1/2 tab PO Q p.m 315 tablet 2   glucose blood test strip USE AS INSTRUCTED     glucose blood test strip USE AS INSTRUCTED     hydrALAZINE (APRESOLINE) 50 MG tablet Take 1 tablet (50 mg total)  by mouth 2 (two) times daily. 270 tablet 3   hydrochlorothiazide (HYDRODIURIL) 25 MG tablet Take 1 tablet (25 mg total) by mouth daily. 90 tablet 3   levothyroxine (SYNTHROID) 88 MCG tablet TAKE 1 TABLET (88 MCG TOTAL) BY MOUTH DAILY. 90 tablet 1   linaclotide (LINZESS) 290 MCG CAPS capsule TAKE 1 CAPSULE BY MOUTH DAILY BEFORE BREAKFAST. 90 capsule 3   losartan (COZAAR) 25 MG tablet Take 1 tablet (25 mg total) by mouth daily. 90 tablet 2   montelukast (SINGULAIR) 10 MG tablet TAKE 1 TABLET BY MOUTH AT BEDTIME. 90 tablet 2   Multiple Vitamin (MULTIVITAMIN WITH MINERALS) TABS tablet Take 1 tablet by mouth daily.     NARCAN 4 MG/0.1ML LIQD nasal spray kit      Omega-3 Fatty Acids (FISH OIL) 1000 MG CAPS Take 1,000 mg by mouth at bedtime.     topiramate (TOPAMAX) 25 MG  tablet Take 1 tablet (25 mg total) by mouth 2 (two) times daily. 60 tablet 0   traMADol (ULTRAM) 50 MG tablet Take 1 tablet (50 mg total) by mouth 2 (two) times daily as needed. 60 tablet 2   VENTOLIN HFA 108 (90 Base) MCG/ACT inhaler INHALE 1-2 PUFFS into THE lungs every SIX hours AS NEEDED FOR WHEEZING OR FOR SHORTNESS OF BREATH 8.5 g 6   vitamin B-12 (CYANOCOBALAMIN) 1000 MCG tablet Take 1,000 mcg by mouth daily.     Vitamin D, Ergocalciferol, (DRISDOL) 1.25 MG (50000 UNIT) CAPS capsule Take 1 capsule (50,000 Units total) by mouth once a week. 12 capsule 0   [DISCONTINUED] amitriptyline (ELAVIL) 50 MG tablet TAKE 1 TABLET BY MOUTH AT BEDTIME. 90 tablet 0   [DISCONTINUED] buPROPion (WELLBUTRIN XL) 300 MG 24 hr tablet TAKE 1 TABLET (300 MG TOTAL) BY MOUTH EVERY MORNING. (Patient taking differently: Take 300 mg by mouth daily.) 90 tablet 2   No current facility-administered medications on file prior to visit.    Allergies  Allergen Reactions   Amlodipine Besylate Other (See Comments)    Edema in lower extremities Other reaction(s): Other Edema in lower extremities   Celexa [Citalopram Hydrobromide] Other (See Comments)    "Made  me feel out of it"   Gabapentin Other (See Comments)    Contributed to lower extremity edema   Diflucan [Fluconazole] Rash    Social History   Socioeconomic History   Marital status: Married    Spouse name: Not on file   Number of children: 4   Years of education: 10   Highest education level: Not on file  Occupational History    Employer: UNEMPLOYED  Tobacco Use   Smoking status: Former    Packs/day: 0.50    Years: 10.00    Pack years: 5.00    Types: Cigarettes    Start date: 07/14/1975    Quit date: 04/04/1993    Years since quitting: 28.6   Smokeless tobacco: Never   Tobacco comments:    quit 20 yrs ago  Vaping Use   Vaping Use: Never used  Substance and Sexual Activity   Alcohol use: No    Alcohol/week: 0.0 standard drinks   Drug use: No   Sexual activity: Yes  Other Topics Concern   Not on file  Social History Narrative   Lives with husband in an apartment on the first floor.  Has 4 children.     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  Trying to get disability.  Formerly worked as a Teacher, early years/pre.  Education: 11th grade.      [facility] (12/23/16):   Originally from [facility]. She was raised in [place]. 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 No pets currently. No bird exposure. She does have mold in her current home in the bathroom, laundry room, & master bedroom.    Social Determinants of Health   Financial Resource Strain: Not on file  Food Insecurity: Not on file  Transportation Needs: Not on file  Physical Activity: Not on file  Stress: Not on file  Social Connections: Not on file  Intimate Partner Violence: Not on file    Family History  Problem Relation Age of Onset   Diabetes Mother    Hypertension Mother    Heart  Problems Mother    Hyperlipidemia Mother    Asthma Mother    Sleep apnea  Mother    Obesity Mother    Diabetes Father    Kidney cancer Father    Hypertension Father    Hyperlipidemia Father    Sleep apnea Father    Hypertension Sister    Allergic rhinitis Sister    Hypertension Sister    Hyperlipidemia Sister    Liver disease Brother    Arthritis Brother    Hypertension Brother    Hyperlipidemia Brother    Hypertension Brother    Breast cancer Maternal Grandmother    Kidney disease Maternal Grandfather    Breast cancer Paternal Grandmother    Eczema Daughter    Lupus Maternal Aunt    Stomach cancer Other        aunt   Colon cancer Neg Hx    Immunodeficiency Neg Hx    Urticaria Neg Hx     Past Surgical History:  Procedure Laterality Date   ABDOMINAL HYSTERECTOMY  02/18/2000   CARDIAC CATHETERIZATION  08/18/2003   normal L main/LAD/L Cfx/RCA (Dr. Adora Fridge)   COLONOSCOPY  2006   Dr. [name], hyperplastic polyps   COLONOSCOPY  08/06/2011   abnormal terminal ileum for 10cm, erosions, geographical ulceration. Bx small bowel mucosa with prominent intramucosal lymphoid aggregates, slightly inflammed   COLONOSCOPY WITH PROPOFOL N/A 12/28/2019   R[place]k: 4 sessile serrated adenomas removed on the colon.  Next colonoscopy in 3 years.   DIRECT LARYNGOSCOPY  03/15/2012   Procedure: DIRECT LARYNGOSCOPY;  Surgeon: [name], MD;  Location: [facility];  Service: ENT;  Laterality: N/A; Dr. Wolicki--:>no foreign body seen. normal esophagus to 40cm   ESOPHAGEAL DILATION  04/17/2015   Procedure: ESOPHAGEAL DILATION;  Surgeon: [name], MD;  [facility];  Service: Endoscopy;;   ESOPHAGOGASTRODUODENOSCOPY  08/23/2008   FKC:LEXN distal esophageal erosions consistent with mild erosive reflux esophagitis, otherwise unremarkable esophagus/ Tiny antral erosions of doubtful clinical significance, otherwise normal stomach, patent pylorus, normal D1 and D2   ESOPHAGOGASTRODUODENOSCOPY  08/06/2011   small hh, noncritical Schatzki's ring s/p 46 F    ESOPHAGOGASTRODUODENOSCOPY N/A 04/17/2015   TZG:YFVCBS s/p dilation   ESOPHAGOGASTRODUODENOSCOPY (EGD) WITH PROPOFOL N/A 01/20/2018   Dr. [name]: Erosive gastropathy, normal-appearing esophagus status post empiric dilation.  Chronic gastritis, no H. pylori.   ESOPHAGOSCOPY  03/15/2012   Procedure: ESOPHAGOSCOPY;  Surgeon: [name], MD;  Location: [facility];  Service: ENT;  Laterality: N/A;   KNEE ARTHROSCOPY  04/27/2001   MALONEY DILATION N/A 01/20/2018   Procedure: Venia Minks DILATION;  Surgeon: [name], MD;  [facility];  Service: Endoscopy;  Laterality: N/A;   NM MYOCAR PERF WALL MOTION  2003   negative bruce protocol exercise tress test; EF 68%; intermediate risk study due to evidence of anterior wall ischemia extending from mid-ventricle to apex   PITUITARY SURGERY  06/2012   benign tumor, surgeon [facility]   POLYPECTOMY  12/28/2019   Procedure: POLYPECTOMY;  Surgeon: [name], MD;  [facility];  Service: Endoscopy;;   RECTOCELE REPAIR     TRANSTHORACIC ECHOCARDIOGRAM  2003   EF normal   VAGINAL PROLAPSE REPAIR     VIDEO BRONCHOSCOPY Bilateral 03/08/2017   Procedure: VIDEO BRONCHOSCOPY WITHOUT FLUORO;  Surgeon: Javier Glazier, MD;  Location: [facility];  Service: Cardiopulmonary;  Laterality: Bilateral;    ROS: Review of Systems Negative except as stated above  PHYSICAL EXAM: BP  128/67    Pulse (!) 58    Resp 16    Wt 299 lb (135.6 kg)    SpO2 98%    BMI 45.46 kg/m   Wt Readings from Last 3 Encounters:  11/10/21 299 lb (135.6 kg)  10/14/21 297 lb (134.7 kg)  09/02/21 299 lb (135.6 kg)    Physical Exam  General appearance - alert, well appearing, morbidly obese African-American female sitting in mobility chair and in no distress Mental status - normal mood, behavior, speech, dress, motor activity, and thought processes Neck - supple, no significant adenopathy Chest - clear to auscultation, no wheezes, rales or rhonchi, symmetric  air entry Heart - normal rate, regular rhythm, normal S1, S2, no murmurs, rubs, clicks or gallops Musculoskeletal -when patient stood up to get on the scale, she is noted to have bowing of the knees. Extremities -no lower extremity edema.   Depression screen [facility] 2/9 11/10/2021 11/06/2021 09/06/2021  Decreased Interest 2 2 2   Down, Depressed, Hopeless 2 3 2   PHQ - 2 Score 4 5 4   Altered sleeping 2 2 1   Tired, decreased energy 2 2 2   Change in appetite 1 1 2   Feeling bad or failure about yourself  1 1 1   Trouble concentrating 1 1 0  Moving slowly or fidgety/restless 1 1 0  Suicidal thoughts 0 0 0  PHQ-9 Score 12 13 10   Difficult doing work/chores - Somewhat difficult Somewhat difficult  Some recent data might be hidden    CMP Latest Ref Rng & Units 01/04/2021 07/27/2020 12/26/2019  Glucose 70 - 99 mg/dL 133(H) 176(H) 165(H)  BUN 8 - 23 mg/dL 10 18 16   Creatinine 0.44 - 1.00 mg/dL 0.74 0.91 0.76  Sodium 135 - 145 mmol/L 137 139 137  Potassium 3.5 - 5.1 mmol/L 3.5 3.6 4.3  Chloride 98 - 111 mmol/L 100 98 101  CO2 22 - 32 mmol/L 29 28 28   Calcium 8.9 - 10.3 mg/dL 9.6 10.4(H) 9.2  Total Protein 6.5 - 8.1 g/dL 7.9 7.8 -  Total Bilirubin 0.3 - 1.2 mg/dL 0.6 1.1 -  Alkaline Phos 38 - 126 U/L 53 71 -  AST 15 - 41 U/L 32 45(H) -  ALT 0 - 44 U/L 35 56(H) -   Lipid Panel     Component Value Date/Time   CHOL 121 06/03/2021 0833   TRIG 73 06/03/2021 0833   HDL 52 06/03/2021 0833   CHOLHDL 2.3 06/03/2021 0833   CHOLHDL 2.3 12/16/2015 0950   VLDL 13 12/16/2015 0950   LDLCALC 54 06/03/2021 0833    CBC    Component Value Date/Time   WBC 12.0 (H) 01/04/2021 1555   RBC 4.34 01/04/2021 1555   HGB 12.8 01/04/2021 1555   HGB 12.0 01/20/2017 1001   HCT 41.0 01/04/2021 1555   HCT 38.2 01/20/2017 1001   HCT 41 06/25/2011 1053   PLT 411 (H) 01/04/2021 1555   PLT 356 01/20/2017 1001   MCV 94.5 01/04/2021 1555   MCV 90 01/20/2017 1001   MCV 91.1 06/25/2011 1053   MCH 29.5 01/04/2021 1555    MCHC 31.2 01/04/2021 1555   RDW 13.0 01/04/2021 1555   RDW 13.1 01/20/2017 1001   LYMPHSABS 4.6 (H) 01/04/2021 1555   LYMPHSABS 3.4 (H) 01/20/2017 1001   MONOABS 1.6 (H) 01/04/2021 1555   EOSABS 0.1 01/04/2021 1555   EOSABS 0.1 01/20/2017 1001   BASOSABS 0.1 01/04/2021 1555   BASOSABS 0.0 01/20/2017 1001    ASSESSMENT AND  PLAN: 1. Type 2 diabetes mellitus with morbid obesity ([place]) Commended her on weight loss over the past 1 year.  She is doing well on Trulicity.  Encouraged her to continue with smaller portion sizes and continue to work with the physical therapist and getting her to move more. - POCT glycosylated hemoglobin (Hb A1C) - metFORMIN (GLUCOPHAGE) 500 MG tablet; TAKE TWO TABLETS BY MOUTH EVERY MORNING and TAKE ONE TABLET EVERY EVENING  Dispense: 270 tablet; Refill: 2  2. Hypertension associated with diabetes ([place]) Close  to goal.  Continue current medications and low-salt diet. - Metoprolol Tartrate 75 MG TABS; TAKE ONE TABLET BY MOUTH EVERY MORNING and TAKE ONE TABLET BY MOUTH EVERY EVENING  Dispense: 180 tablet; Refill: 2 - spironolactone (ALDACTONE) 25 MG tablet; Take 1 tablet (25 mg total) by mouth every morning.  Dispense: 90 tablet; Refill: 2  3. Other osteoarthritis involving multiple joints I signed off on the form for her to get renewal of her handicap sticker.  Encouraged her to continue trying to lose weight. Patient on Celebrex and tramadol for pain control.  Tramadol being prescribed through her orthopedic specialist.  4. Hypothyroidism, unspecified type Continue levothyroxine. - TSH  5. Moderate major depression ([place]) Followed by behavioral health.  Cymbalta recently increased.  She denies any suicidal ideation at this time.     Patient was given the opportunity to ask questions.  Patient verbalized understanding of the plan and was able to repeat key elements of the plan.   Orders Placed This Encounter  Procedures   TSH   POCT glycosylated  hemoglobin (Hb A1C)     Requested Prescriptions   Signed Prescriptions Disp Refills   Metoprolol Tartrate 75 MG TABS 180 tablet 2    Sig: TAKE ONE TABLET BY MOUTH EVERY MORNING and TAKE ONE TABLET BY MOUTH EVERY EVENING   spironolactone (ALDACTONE) 25 MG tablet 90 tablet 2    Sig: Take 1 tablet (25 mg total) by mouth every morning.   metFORMIN (GLUCOPHAGE) 500 MG tablet 270 tablet 2    Sig: TAKE TWO TABLETS BY MOUTH EVERY MORNING and TAKE ONE TABLET EVERY EVENING    Return in about 4 months (around 03/10/2022).  Karle Plumber, MD, FACP

## 2021-11-10 NOTE — Telephone Encounter (Signed)
Requesting too soon, filled for 30 day supply 10/16/21.  Marland Kitchen Requested Prescriptions  Pending Prescriptions Disp Refills   hydrochlorothiazide (HYDRODIURIL) 25 MG tablet [Pharmacy Med Name: hydrochlorothiazide 25 mg tablet] 90 tablet 3    Sig: TAKE ONE TABLET BY MOUTH EVERY MORNING     Cardiovascular: Diuretics - Thiazide Failed - 11/10/2021  3:13 PM      Failed - Cr in normal range and within 180 days    Creat  Date Value Ref Range Status  09/22/2016 0.80 0.50 - 1.05 mg/dL Final    Comment:      For patients > or = 64 years of age: The upper reference limit for Creatinine is approximately 13% higher for people identified as African-American.      Creatinine, Ser  Date Value Ref Range Status  01/04/2021 0.74 0.44 - 1.00 mg/dL Final         Failed - K in normal range and within 180 days    Potassium  Date Value Ref Range Status  01/04/2021 3.5 3.5 - 5.1 mmol/L Final  06/25/2011 4.2 mmol/L Final         Failed - Na in normal range and within 180 days    Sodium  Date Value Ref Range Status  01/04/2021 137 135 - 145 mmol/L Final  09/08/2018 139 134 - 144 mmol/L Final         Passed - Last BP in normal range    BP Readings from Last 1 Encounters:  11/10/21 128/67         Passed - Valid encounter within last 6 months    Recent Outpatient Visits          Today Type 2 diabetes mellitus with morbid obesity ([place])   [place], MD   3 months ago Need for shingles vaccine   [place], Annie Main L, RPH-CPP   4 months ago Type 2 diabetes mellitus with morbid obesity [facility])   [place] Karle Plumber B, MD   8 months ago Type 2 diabetes mellitus with morbid obesity [facility])   [place] Karle Plumber B, MD   11 months ago Type 2 diabetes mellitus with morbid obesity [facility])   [place], MD      Future Appointments            In 4 months Wynetta Emery, Dalbert Batman, MD [place]

## 2021-11-11 ENCOUNTER — Telehealth: Payer: Self-pay

## 2021-11-11 LAB — TSH: TSH: 0.891 u[IU]/mL (ref 0.450–4.500)

## 2021-11-11 NOTE — Telephone Encounter (Signed)
Contacted pt to go over lab results pt is aware and doesn't have any questions or concerns 

## 2021-11-13 ENCOUNTER — Other Ambulatory Visit (INDEPENDENT_AMBULATORY_CARE_PROVIDER_SITE_OTHER): Payer: Self-pay | Admitting: Bariatrics

## 2021-11-13 DIAGNOSIS — E1169 Type 2 diabetes mellitus with other specified complication: Secondary | ICD-10-CM

## 2021-11-13 DIAGNOSIS — F5089 Other specified eating disorder: Secondary | ICD-10-CM

## 2021-11-13 NOTE — Telephone Encounter (Signed)
Dr.Brown 

## 2021-11-17 ENCOUNTER — Ambulatory Visit (INDEPENDENT_AMBULATORY_CARE_PROVIDER_SITE_OTHER): Payer: Medicaid Other | Admitting: Bariatrics

## 2021-11-17 ENCOUNTER — Other Ambulatory Visit: Payer: Self-pay

## 2021-11-17 ENCOUNTER — Encounter (INDEPENDENT_AMBULATORY_CARE_PROVIDER_SITE_OTHER): Payer: Self-pay | Admitting: Bariatrics

## 2021-11-17 VITALS — BP 128/84 | HR 57 | Temp 98.1°F | Ht 68.0 in | Wt 292.0 lb

## 2021-11-17 DIAGNOSIS — E1169 Type 2 diabetes mellitus with other specified complication: Secondary | ICD-10-CM | POA: Diagnosis not present

## 2021-11-17 DIAGNOSIS — Z6841 Body Mass Index (BMI) 40.0 and over, adult: Secondary | ICD-10-CM

## 2021-11-17 DIAGNOSIS — F5089 Other specified eating disorder: Secondary | ICD-10-CM | POA: Diagnosis not present

## 2021-11-17 DIAGNOSIS — Z7985 Long-term (current) use of injectable non-insulin antidiabetic drugs: Secondary | ICD-10-CM

## 2021-11-17 DIAGNOSIS — E669 Obesity, unspecified: Secondary | ICD-10-CM | POA: Diagnosis not present

## 2021-11-17 MED ORDER — TRULICITY 1.5 MG/0.5ML ~~LOC~~ SOAJ: 1.5000 mg | SUBCUTANEOUS | 0 refills | Status: DC

## 2021-11-17 MED ORDER — TOPIRAMATE 25 MG PO TABS: 25.0000 mg | ORAL_TABLET | Freq: Two times a day (BID) | ORAL | 0 refills | Status: DC

## 2021-11-17 NOTE — Progress Notes (Signed)
Chief Complaint:   OBESITY Robin [name] is here to discuss her progress with her obesity treatment plan along with follow-up of her obesity related diagnoses. Robin [name] is on the Category 3 Plan and states she is following her eating plan approximately 40% of the time. Robin [name] states she is doing chair exercise for 30 minutes 3 times per week.  Today's visit was #: 8 Starting weight: 310 lbs Starting date: 06/03/2021 Today's weight: 292 lbs Today's date: 11/17/2021 Total lbs lost to date: 18 lbs Total lbs lost since last in-office visit: 5 lbs  Interim History: Robin [name] is down an additional 5 lbs.   Subjective:   1. Diabetes mellitus type 2 in obese [facility]) Tanesha is taking her medications as directed.   2. Other disorder of eating Robin [name] states Topamax is helping with cravings. She is taking her medications as directed.   Assessment/Plan:   1. Diabetes mellitus type 2 in obese (HCC) We will refill Trulicity 1.5 mg for 1 month with no refills. We will check Microalbumin urine, A1C, insulin, and CMP today. Good blood sugar control is important to decrease the likelihood of diabetic complications such as nephropathy, neuropathy, limb loss, blindness, coronary artery disease, and death. Intensive lifestyle modification including diet, exercise and weight loss are the first line of treatment for diabetes.   - Dulaglutide (TRULICITY) 1.5 OZ/3.6UY SOPN; Inject 1.5 mg into the skin once a week.  Dispense: 2 mL; Refill: 0 - Insulin, random - Hemoglobin A1c - Comprehensive metabolic panel - Microalbumin / creatinine urine ratio  2. Other disorder of eating We will refill Topamax for 1 month with no refills. Behavior modification techniques were discussed today to help Robin [name] deal with her emotional/non-hunger eating behaviors.  Orders and follow up as documented in patient record.   - topiramate (TOPAMAX) 25 MG tablet; Take 1 tablet (25 mg total) by mouth 2 (two) times daily.  Dispense:  60 tablet; Refill: 0  3. Obesity, current BMI 45.4 Robin [name] is currently in the action stage of change. As such, her goal is to continue with weight loss efforts. She has agreed to the Category 3 Plan.   Robin [name] will adhere closely to the plan. She will be mindful eating. She will keep protein high and she will decrease carbohydrates.   Exercise goals:  As is.  Behavioral modification strategies: increasing lean protein intake, decreasing simple carbohydrates, increasing vegetables, increasing water intake, decreasing eating out, no skipping meals, meal planning and cooking strategies, keeping healthy foods in the home, and planning for success.  Robin [name] has agreed to follow-up with our clinic in 3 weeks. She was informed of the importance of frequent follow-up visits to maximize her success with intensive lifestyle modifications for her multiple health conditions.   Robin [name] was informed we would discuss her lab results at her next visit unless there is a critical issue that needs to be addressed sooner. Robin [name] agreed to keep her next visit at the agreed upon time to discuss these results.  Objective:   Blood pressure 128/84, pulse (!) 57, temperature 98.1 F (36.7 C), height 5\' 8"  (1.727 m), weight 292 lb (132.5 kg), SpO2 98 %. Body mass index is 44.4 kg/m.  General: Cooperative, alert, well developed, in no acute distress. HEENT: Conjunctivae and lids unremarkable. Cardiovascular: Regular rhythm.  Lungs: Normal work of breathing. Neurologic: No focal deficits.   Lab Results  Component Value Date   CREATININE 0.74 01/04/2021   BUN 10 01/04/2021   NA 137 01/04/2021  K 3.5 01/04/2021   CL 100 01/04/2021   CO2 29 01/04/2021   Lab Results  Component Value Date   ALT 35 01/04/2021   AST 32 01/04/2021   ALKPHOS 53 01/04/2021   BILITOT 0.6 01/04/2021   Lab Results  Component Value Date   HGBA1C 5.7 11/10/2021   HGBA1C 6.1 (H) 06/03/2021   HGBA1C 6.7 12/02/2020   HGBA1C 6.7  (H) 02/15/2020   HGBA1C 6.5 (H) 11/13/2019   Lab Results  Component Value Date   INSULIN 48.4 (H) 06/03/2021   Lab Results  Component Value Date   TSH 0.891 11/10/2021   Lab Results  Component Value Date   CHOL 121 06/03/2021   HDL 52 06/03/2021   LDLCALC 54 06/03/2021   TRIG 73 06/03/2021   CHOLHDL 2.3 06/03/2021   Lab Results  Component Value Date   VD25OH 61.7 06/03/2021   Lab Results  Component Value Date   WBC 12.0 (H) 01/04/2021   HGB 12.8 01/04/2021   HCT 41.0 01/04/2021   MCV 94.5 01/04/2021   PLT 411 (H) 01/04/2021   Lab Results  Component Value Date   IRON 31 03/04/2015   TIBC 340 03/04/2015   FERRITIN 34 03/04/2015   Attestation Statements:   Reviewed by clinician on day of visit: allergies, medications, problem list, medical history, surgical history, family history, social history, and previous encounter notes.  I, Lizbeth Bark, RMA, am acting as Location manager for [employer], DO.  I have reviewed the above documentation for accuracy and completeness, and I agree with the above. Jearld Lesch, DO

## 2021-11-18 ENCOUNTER — Encounter (INDEPENDENT_AMBULATORY_CARE_PROVIDER_SITE_OTHER): Payer: Self-pay | Admitting: Bariatrics

## 2021-11-18 DIAGNOSIS — E559 Vitamin D deficiency, unspecified: Secondary | ICD-10-CM | POA: Insufficient documentation

## 2021-11-18 DIAGNOSIS — E8881 Metabolic syndrome: Secondary | ICD-10-CM | POA: Insufficient documentation

## 2021-11-18 LAB — COMPREHENSIVE METABOLIC PANEL
ALT: 20 IU/L (ref 0–32)
AST: 20 IU/L (ref 0–40)
Albumin/Globulin Ratio: 1.4 (ref 1.2–2.2)
Albumin: 4.3 g/dL (ref 3.8–4.8)
Alkaline Phosphatase: 70 IU/L (ref 44–121)
BUN/Creatinine Ratio: 12 (ref 12–28)
BUN: 11 mg/dL (ref 8–27)
Bilirubin Total: 0.4 mg/dL (ref 0.0–1.2)
CO2: 26 mmol/L (ref 20–29)
Calcium: 9.9 mg/dL (ref 8.7–10.3)
Chloride: 103 mmol/L (ref 96–106)
Creatinine, Ser: 0.9 mg/dL (ref 0.57–1.00)
Globulin, Total: 3.1 g/dL (ref 1.5–4.5)
Glucose: 82 mg/dL (ref 70–99)
Potassium: 4.4 mmol/L (ref 3.5–5.2)
Sodium: 143 mmol/L (ref 134–144)
Total Protein: 7.4 g/dL (ref 6.0–8.5)
eGFR: 72 mL/min/{1.73_m2} (ref >59)

## 2021-11-18 LAB — HEMOGLOBIN A1C
Est. average glucose Bld gHb Est-mCnc: 117 mg/dL
Hgb A1c MFr Bld: 5.7 % — ABNORMAL HIGH (ref 4.8–5.6)

## 2021-11-18 LAB — INSULIN, RANDOM: INSULIN: 28.2 u[IU]/mL — ABNORMAL HIGH (ref 2.6–24.9)

## 2021-11-18 LAB — MICROALBUMIN / CREATININE URINE RATIO
Creatinine, Urine: 142.3 mg/dL
Microalb/Creat Ratio: 4 mg/g creat (ref 0–29)
Microalbumin, Urine: 5 ug/mL

## 2021-11-20 ENCOUNTER — Ambulatory Visit (HOSPITAL_COMMUNITY): Payer: Medicaid Other | Admitting: Clinical

## 2021-12-03 ENCOUNTER — Other Ambulatory Visit: Payer: Self-pay | Admitting: Internal Medicine

## 2021-12-03 NOTE — Telephone Encounter (Signed)
Requested Prescriptions  ?Pending Prescriptions Disp Refills  ?? celecoxib (CELEBREX) 100 MG capsule [Pharmacy Med Name: celecoxib 100 mg capsule] 180 capsule 1  ?  Sig: TAKE ONE CAPSULE BY MOUTH TWICE DAILY  ?  ? Analgesics:  COX2 Inhibitors Failed - 12/03/2021  8:00 AM  ?  ?  Failed - Manual Review: Labs are only required if the patient has taken medication for more than 8 weeks.  ?  ?  Passed - HGB in normal range and within 360 days  ?  Hemoglobin  ?Date Value Ref Range Status  ?01/04/2021 12.8 12.0 - 15.0 g/dL Final  ?01/20/2017 12.0 11.1 - 15.9 g/dL Final  ?   ?  ?  Passed - Cr in normal range and within 360 days  ?  Creat  ?Date Value Ref Range Status  ?09/22/2016 0.80 0.50 - 1.05 mg/dL Final  ?  Comment:  ?    ?For patients > or = 64 years of age: The upper reference limit for ?Creatinine is approximately 13% higher for people identified as ?African-American. ?  ?  ? ?Creatinine, Ser  ?Date Value Ref Range Status  ?11/17/2021 0.90 0.57 - 1.00 mg/dL Final  ?   ?  ?  Passed - HCT in normal range and within 360 days  ?  HCT  ?Date Value Ref Range Status  ?01/04/2021 41.0 36.0 - 46.0 % Final  ?06/25/2011 41 % Final  ? ?Hematocrit  ?Date Value Ref Range Status  ?01/20/2017 38.2 34.0 - 46.6 % Final  ?   ?  ?  Passed - AST in normal range and within 360 days  ?  AST  ?Date Value Ref Range Status  ?11/17/2021 20 0 - 40 IU/L Final  ?06/25/2011 16 U/L Final  ?   ?  ?  Passed - ALT in normal range and within 360 days  ?  ALT  ?Date Value Ref Range Status  ?11/17/2021 20 0 - 32 IU/L Final  ?   ?  ?  Passed - eGFR is 30 or above and within 360 days  ?  GFR, Est African American  ?Date Value Ref Range Status  ?09/22/2016 >89 >=60 mL/min Final  ? ?GFR calc Af Amer  ?Date Value Ref Range Status  ?12/26/2019 >60 >60 mL/min Final  ? ?GFR, Est Non African American  ?Date Value Ref Range Status  ?09/22/2016 82 >=60 mL/min Final  ? ?GFR, Estimated  ?Date Value Ref Range Status  ?01/04/2021 >60 >60 mL/min Final  ?  Comment:  ?   (NOTE) ?Calculated using the CKD-EPI Creatinine Equation (2021) ?  ? ?GFR  ?Date Value Ref Range Status  ?03/08/2018 87.84 >60.00 mL/min Final  ? ?eGFR  ?Date Value Ref Range Status  ?11/17/2021 72 >59 mL/min/1.73 Final  ?   ?  ?  Passed - Patient is not pregnant  ?  ?  Passed - Valid encounter within last 12 months  ?  Recent Outpatient Visits   ?      ? 3 weeks ago Type 2 diabetes mellitus with morbid obesity ([place])  ? [place] Karle Plumber B, MD  ? 4 months ago Need for shingles vaccine  ? [place], RPH-CPP  ? 4 months ago Type 2 diabetes mellitus with morbid obesity ([place])  ? [place] Karle Plumber B, MD  ? 9 months ago Type 2 diabetes mellitus with morbid  obesity ([place])  ? [place] Ladell Pier, MD  ? 1 year ago Type 2 diabetes mellitus with morbid obesity ([place])  ? [place] Ladell Pier, MD  ?  ?  ?Future Appointments   ?        ? In 3 months Ladell Pier, MD [place]  ?  ? ?  ?  ?  ? ?

## 2021-12-05 ENCOUNTER — Ambulatory Visit (INDEPENDENT_AMBULATORY_CARE_PROVIDER_SITE_OTHER): Payer: Medicaid Other | Admitting: Podiatry

## 2021-12-05 ENCOUNTER — Other Ambulatory Visit: Payer: Self-pay

## 2021-12-05 ENCOUNTER — Encounter: Payer: Self-pay | Admitting: Podiatry

## 2021-12-05 DIAGNOSIS — M79675 Pain in left toe(s): Secondary | ICD-10-CM | POA: Diagnosis not present

## 2021-12-05 DIAGNOSIS — E1142 Type 2 diabetes mellitus with diabetic polyneuropathy: Secondary | ICD-10-CM | POA: Diagnosis not present

## 2021-12-05 DIAGNOSIS — L600 Ingrowing nail: Secondary | ICD-10-CM

## 2021-12-05 DIAGNOSIS — M79674 Pain in right toe(s): Secondary | ICD-10-CM

## 2021-12-05 DIAGNOSIS — B351 Tinea unguium: Secondary | ICD-10-CM | POA: Diagnosis not present

## 2021-12-09 ENCOUNTER — Ambulatory Visit (INDEPENDENT_AMBULATORY_CARE_PROVIDER_SITE_OTHER): Payer: Medicaid Other | Admitting: Bariatrics

## 2021-12-11 ENCOUNTER — Ambulatory Visit (INDEPENDENT_AMBULATORY_CARE_PROVIDER_SITE_OTHER): Payer: Medicaid Other | Admitting: Physician Assistant

## 2021-12-14 NOTE — Progress Notes (Signed)
Subjective: ?Robin [name] is a 66 y.o. female patient seen today for follow up of  at risk foot care with history of diabetic neuropathy and painful thick toenails that are difficult to trim. Pain interferes with ambulation. Aggravating factors include wearing enclosed shoe gear. Pain is relieved with periodic professional debridement..  ? ?She states she had been very ill with COVID. She is recovering, but still feels tired. ? ?Patient states she is having pain in right great toe. She suspects ingrown toenail. ? ?Patient states blood glucose was 95 mg/dl today. Last A1c was 5.7%, February, 2023. ? ?PCP is Ladell Pier, MD. Last visit was: 11/10/2021. ? ?Allergies  ?Allergen Reactions  ? Amlodipine Besylate Other (See Comments)  ?  Edema in lower extremities ?Other reaction(s): Other ?Edema in lower extremities  ? Celexa [Citalopram Hydrobromide] Other (See Comments)  ?  "Made me feel out of it"  ? Gabapentin Other (See Comments)  ?  Contributed to lower extremity edema  ? Diflucan [Fluconazole] Rash  ? ? ?Objective: ?Physical Exam ? ?General: Patient is a pleasant 66 y.o. African American female morbidly obese in NAD. AAO x 3.  ? ?Neurovascular Examination: ?Capillary refill time to digits immediate b/l. Palpable pedal pulses b/l LE. Pedal hair sparse. No pain with calf compression b/l. Lower extremity skin temperature gradient within normal limits. Trace edema noted BLE. No cyanosis or clubbing noted b/l LE. ? ?Pt has subjective symptoms of neuropathy. Protective sensation intact 5/5 intact bilaterally with 10g monofilament b/l. ? ?Dermatological:  ?Pedal skin is warm and supple b/l LE. No open wounds b/l LE. No interdigital macerations noted b/l LE. Toenails 1-5 bilaterally elongated, discolored, dystrophic, thickened, and crumbly with subungual debris and tenderness to dorsal palpation. Incurvated nailplate right great toe.  Nail border hypertrophy absent. There is tenderness to palpation. Sign(s) of  infection: no clinical signs of infection noted on examination today.. ? ?Musculoskeletal:  ?Muscle strength 5/5 to all lower extremity muscle groups bilaterally. No pain, crepitus or joint limitation noted with ROM bilateral LE. No gross bony deformities bilaterally. ? ?Assessment: ?1. Pain due to onychomycosis of toenails of both feet   ?2. Ingrown toenail without infection   ?3. Diabetic peripheral neuropathy associated with type 2 diabetes mellitus ([place])   ? ? ?Plan: ?Patient was evaluated and treated and all questions answered. ?Consent given for treatment as described below: ?-Discussed permanent partial nail avulsion for affected borders right great toe. This has been problematic for her. She is interested in having this done. Refer to Dr. Sherryle Lis in a couple of weeks for this . ?-Toenails 1-5 bilaterally debrided in length and girth without iatrogenic bleeding with sterile nail nipper and dremel.  ?-Offending nail border debrided and curretaged R hallux utilizing sterile nail nipper and currette. Border(s) cleansed with alcohol and triple antibiotic ointment applied. Patient instructed to apply triple antibiotic ointment  to R hallux once daily for 7 days. ?-Patient/POA to call should there be question/concern in the interim. ? ?Return in about 3 months (around 03/07/2022). ? ?Marzetta Board, DPM ?

## 2021-12-15 ENCOUNTER — Other Ambulatory Visit: Payer: Self-pay

## 2021-12-15 ENCOUNTER — Ambulatory Visit (INDEPENDENT_AMBULATORY_CARE_PROVIDER_SITE_OTHER): Payer: Medicaid Other | Admitting: Physician Assistant

## 2021-12-15 ENCOUNTER — Encounter (INDEPENDENT_AMBULATORY_CARE_PROVIDER_SITE_OTHER): Payer: Self-pay | Admitting: Physician Assistant

## 2021-12-15 VITALS — BP 125/76 | HR 64 | Temp 98.0°F | Ht 68.0 in | Wt 284.0 lb

## 2021-12-15 DIAGNOSIS — Z7985 Long-term (current) use of injectable non-insulin antidiabetic drugs: Secondary | ICD-10-CM | POA: Diagnosis not present

## 2021-12-15 DIAGNOSIS — E1169 Type 2 diabetes mellitus with other specified complication: Secondary | ICD-10-CM

## 2021-12-15 DIAGNOSIS — E669 Obesity, unspecified: Secondary | ICD-10-CM | POA: Diagnosis not present

## 2021-12-15 DIAGNOSIS — Z6841 Body Mass Index (BMI) 40.0 and over, adult: Secondary | ICD-10-CM | POA: Diagnosis not present

## 2021-12-15 MED ORDER — TRULICITY 1.5 MG/0.5ML ~~LOC~~ SOAJ: 1.5000 mg | SUBCUTANEOUS | 0 refills | Status: DC

## 2021-12-16 ENCOUNTER — Other Ambulatory Visit: Payer: Self-pay | Admitting: Internal Medicine

## 2021-12-16 NOTE — Telephone Encounter (Signed)
Requested Prescriptions  ?Pending Prescriptions Disp Refills  ?? hydrochlorothiazide (HYDRODIURIL) 25 MG tablet [Pharmacy Med Name: hydrochlorothiazide 25 mg tablet] 90 tablet 1  ?  Sig: TAKE ONE TABLET BY MOUTH EVERY MORNING  ?  ? Cardiovascular: Diuretics - Thiazide Passed - 12/16/2021  4:14 PM  ?  ?  Passed - Cr in normal range and within 180 days  ?  Creat  ?Date Value Ref Range Status  ?09/22/2016 0.80 0.50 - 1.05 mg/dL Final  ?  Comment:  ?    ?For patients > or = 64 years of age: The upper reference limit for ?Creatinine is approximately 13% higher for people identified as ?African-American. ?  ?  ? ?Creatinine, Ser  ?Date Value Ref Range Status  ?11/17/2021 0.90 0.57 - 1.00 mg/dL Final  ?   ?  ?  Passed - K in normal range and within 180 days  ?  Potassium  ?Date Value Ref Range Status  ?11/17/2021 4.4 3.5 - 5.2 mmol/L Final  ?06/25/2011 4.2 mmol/L Final  ?   ?  ?  Passed - Na in normal range and within 180 days  ?  Sodium  ?Date Value Ref Range Status  ?11/17/2021 143 134 - 144 mmol/L Final  ?   ?  ?  Passed - Last BP in normal range  ?  BP Readings from Last 1 Encounters:  ?12/15/21 125/76  ?   ?  ?  Passed - Valid encounter within last 6 months  ?  Recent Outpatient Visits   ?      ? 1 month ago Type 2 diabetes mellitus with morbid obesity ([place])  ? [place] Karle Plumber B, MD  ? 4 months ago Need for shingles vaccine  ? [place], RPH-CPP  ? 5 months ago Type 2 diabetes mellitus with morbid obesity ([place])  ? [place] Ladell Pier, MD  ? 9 months ago Type 2 diabetes mellitus with morbid obesity ([place])  ? [place] Ladell Pier, MD  ? 1 year ago Type 2 diabetes mellitus with morbid obesity ([place])  ? [place] Ladell Pier, MD  ?  ?  ?Future Appointments   ?        ? In 2 months Ladell Pier, MD [place]  ?  ? ?  ?  ?  ?? levothyroxine (SYNTHROID) 88 MCG tablet [Pharmacy Med Name: levothyroxine 88 mcg tablet] 97 tablet 2  ?  Sig: TAKE ONE TABLET BY MOUTH BEFORE BREAKFAST  ?  ? Endocrinology:  Hypothyroid Agents Passed - 12/16/2021  4:14 PM  ?  ?  Passed - TSH in normal range and within 360 days  ?  TSH  ?Date Value Ref Range Status  ?11/10/2021 0.891 0.450 - 4.500 uIU/mL Final  ?   ?  ?  Passed - Valid encounter within last 12 months  ?  Recent Outpatient Visits   ?      ? 1 month ago Type 2 diabetes mellitus with morbid obesity ([place])  ? [place] Karle Plumber B, MD  ? 4 months ago Need for shingles vaccine  ? [place], RPH-CPP  ? 5 months ago Type 2 diabetes mellitus with morbid obesity ([place])  ? [place], [place] Laming B, MD  ? 9 months ago Type 2 diabetes mellitus with morbid obesity ([place])  ? [place] Ladell Pier, MD  ? 1 year ago Type 2 diabetes mellitus with morbid obesity ([place])  ? [place] Ladell Pier, MD  ?  ?  ?Future Appointments   ?        ? In 2 months Ladell Pier, MD [place]  ?  ? ?  ?  ?  ? ?

## 2021-12-16 NOTE — Progress Notes (Signed)
? ? ? ?Chief Complaint:  ? ?OBESITY ?Robin [name] is here to discuss her progress with her obesity treatment plan along with follow-up of her obesity related diagnoses. Robin [name] is on the Category 3 Plan and states she is following her eating plan approximately 50% of the time. Robin [name] states she is doing chair exercise for 30 minutes 3 times per week. ? ?Today's visit was #: 9 ?Starting weight: 310 lbs ?Starting date: 06/03/2021 ?Today's weight: 284 lbs ?Today's date: 12/15/2021 ?Total lbs lost to date: 26 lbs ?Total lbs lost since last in-office visit: 8 lbs ? ?Interim History: Robin [name] is portion controlling her sweets. She also has decreased her portion sizes. She is skipping lunch due to lack of hunger. She may have fruit or crackers for a snack.  ? ?Subjective:  ? ?1. Diabetes mellitus type 2 in obese Robin [name]) ?Robin [name]'s last A1C was 5.7. She is on Trulicity 1.5 mg.  ? ?Assessment/Plan:  ? ?1. Diabetes mellitus type 2 in obese Robin [name]) ?We will refill Trulicity 1.5 mg for 1 month with no refills. Good blood sugar control is important to decrease the likelihood of diabetic complications such as nephropathy, neuropathy, limb loss, blindness, coronary artery disease, and death. Intensive lifestyle modification including diet, exercise and weight loss are the first line of treatment for diabetes.  ? ?- Dulaglutide (TRULICITY) 1.5 JM/4.2AS SOPN; Inject 1.5 mg into the skin once a week.  Dispense: 2 mL; Refill: 0 ? ?2. Obesity, current BMI 43.19 ?S[place]ta is currently in the action stage of change. As such, her goal is to continue with weight loss efforts. She has agreed to the Category 3 Plan.  ? ?Robin [name] will break up meals to get all protein in.  ? ?Exercise goals:  As is. ? ?Behavioral modification strategies: increasing lean protein intake and no skipping meals. ? ?Robin [name] has agreed to follow-up with our clinic in 4 weeks. She was informed of the importance of frequent follow-up visits to maximize her success with intensive  lifestyle modifications for her multiple health conditions.  ? ?Objective:  ? ?Blood pressure 125/76, pulse 64, temperature 98 ?F (36.7 ?C), height '5\' 8"'$  (1.727 m), weight 284 lb (128.8 kg), SpO2 96 %. ?Body mass index is 43.18 kg/m?. ? ?General: Cooperative, alert, well developed, in no acute distress. ?HEENT: Conjunctivae and lids unremarkable. ?Cardiovascular: Regular rhythm.  ?Lungs: Normal work of breathing. ?Neurologic: No focal deficits.  ? ?Lab Results  ?Component Value Date  ? CREATININE 0.90 11/17/2021  ? BUN 11 11/17/2021  ? NA 143 11/17/2021  ? K 4.4 11/17/2021  ? CL 103 11/17/2021  ? CO2 26 11/17/2021  ? ?Lab Results  ?Component Value Date  ? ALT 20 11/17/2021  ? AST 20 11/17/2021  ? ALKPHOS 70 11/17/2021  ? BILITOT 0.4 11/17/2021  ? ?Lab Results  ?Component Value Date  ? HGBA1C 5.7 (H) 11/17/2021  ? HGBA1C 5.7 11/10/2021  ? HGBA1C 6.1 (H) 06/03/2021  ? HGBA1C 6.7 12/02/2020  ? HGBA1C 6.7 (H) 02/15/2020  ? ?Lab Results  ?Component Value Date  ? INSULIN 28.2 (H) 11/17/2021  ? INSULIN 48.4 (H) 06/03/2021  ? ?Lab Results  ?Component Value Date  ? TSH 0.891 11/10/2021  ? ?Lab Results  ?Component Value Date  ? CHOL 121 06/03/2021  ? HDL 52 06/03/2021  ? [place] 54 06/03/2021  ? TRIG 73 06/03/2021  ? CHOLHDL 2.3 06/03/2021  ? ?Lab Results  ?Component Value Date  ? VD25OH 61.7 06/03/2021  ? ?Lab Results  ?Component Value Date  ?  WBC 12.0 (H) 01/04/2021  ? HGB 12.8 01/04/2021  ? HCT 41.0 01/04/2021  ? MCV 94.5 01/04/2021  ? PLT 411 (H) 01/04/2021  ? ?Lab Results  ?Component Value Date  ? IRON 31 03/04/2015  ? TIBC 340 03/04/2015  ? FERRITIN 34 03/04/2015  ? ?Attestation Statements:  ? ?Reviewed by clinician on day of visit: allergies, medications, problem list, medical history, surgical history, family history, social history, and previous encounter notes. ? ?I, Tonye Pearson, am acting as Location manager for [employer], PA-C. ? ?I have reviewed the above documentation for accuracy and completeness, and  I agree with the above. Abby Potash, PA-C ? ?

## 2021-12-19 ENCOUNTER — Ambulatory Visit: Payer: Self-pay | Admitting: Dietician

## 2021-12-22 ENCOUNTER — Other Ambulatory Visit: Payer: Self-pay | Admitting: Internal Medicine

## 2021-12-22 DIAGNOSIS — Z1231 Encounter for screening mammogram for malignant neoplasm of breast: Secondary | ICD-10-CM

## 2021-12-25 ENCOUNTER — Ambulatory Visit: Payer: Medicaid Other | Admitting: Podiatry

## 2022-01-01 ENCOUNTER — Ambulatory Visit (INDEPENDENT_AMBULATORY_CARE_PROVIDER_SITE_OTHER): Payer: Medicaid Other | Admitting: Clinical

## 2022-01-01 DIAGNOSIS — F341 Dysthymic disorder: Secondary | ICD-10-CM

## 2022-01-02 ENCOUNTER — Encounter (HOSPITAL_COMMUNITY): Payer: Self-pay

## 2022-01-02 NOTE — Plan of Care (Signed)
Client was in agreement to the plan. ?
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
## 2022-01-08 ENCOUNTER — Ambulatory Visit (INDEPENDENT_AMBULATORY_CARE_PROVIDER_SITE_OTHER): Payer: Medicaid Other | Admitting: Podiatry

## 2022-01-08 DIAGNOSIS — L600 Ingrowing nail: Secondary | ICD-10-CM | POA: Diagnosis not present

## 2022-01-08 MED ORDER — NEOMYCIN-POLYMYXIN-HC 1 % OT SOLN
OTIC | 0 refills | Status: DC
Start: 2022-01-08 — End: 2022-03-10

## 2022-01-08 NOTE — Patient Instructions (Signed)

## 2022-01-11 ENCOUNTER — Encounter: Payer: Self-pay | Admitting: Podiatry

## 2022-01-11 NOTE — Progress Notes (Signed)
?  Subjective:  ?Patient ID: Robin [name], female    DOB: 08-01-1956,  MRN: [id number] ? ?Chief Complaint  ?Patient presents with  ? Ingrown Toenail  ?   2 weeks with Dr. Sherryle Lis for ingrown toenail removal medial border right great toe; also painful on lateral border. Last A1c 5.7%.  ? ? ?66 y.o. female presents with the above complaint. History confirmed with patient.  She was referred to me by Dr. Elisha Ponder for an ongoing painful ingrowing right great toenail medial border ? ?Objective:  ?Physical Exam: ?warm, good capillary refill, no trophic changes or ulcerative lesions, normal DP and PT pulses, normal sensory exam, and ingrowing medial border, no paronychia noted ? ?Assessment:  ?No diagnosis found. ? ? ?Plan:  ?Patient was evaluated and treated and all questions answered. ? ?Ingrown Nail ?-Patient elects to proceed with ingrown toenail removal today ?-Ingrown nail excised. See procedure note. ?-Educated on post-procedure care including soaking. Written instructions provided. ?-Rx for Cortisporin drops ?-Patient to follow up in 2 weeks for nail check. ? ?Procedure: Excision of Ingrown Toenail ?Location: Right 1st toe medial nail borders. ?Anesthesia: lido 2% and marcaine 0.5%  ?Skin Prep: Betadine. ?Dressing: Silvadene; telfa; dry, sterile, compression dressing. ?Technique: Following skin prep, the toe was exsanguinated and a tourniquet was secured at the base of the toe. The affected nail border was freed, split with a nail splitter, and excised. Chemical matrixectomy was then performed with phenol and irrigated out with alcohol. The tourniquet was then removed and sterile dressing applied. ?Disposition: Patient tolerated procedure well. Patient to return in 2 weeks for follow-up. ? ?Return in about 3 weeks (around 01/29/2022) for nail re-check.  ? ?

## 2022-01-12 ENCOUNTER — Encounter (INDEPENDENT_AMBULATORY_CARE_PROVIDER_SITE_OTHER): Payer: Self-pay | Admitting: Bariatrics

## 2022-01-12 ENCOUNTER — Ambulatory Visit (INDEPENDENT_AMBULATORY_CARE_PROVIDER_SITE_OTHER): Payer: Medicaid Other | Admitting: Bariatrics

## 2022-01-12 VITALS — BP 128/84 | HR 60 | Temp 98.0°F | Ht 68.0 in | Wt 283.0 lb

## 2022-01-12 DIAGNOSIS — E1169 Type 2 diabetes mellitus with other specified complication: Secondary | ICD-10-CM | POA: Diagnosis not present

## 2022-01-12 DIAGNOSIS — F5089 Other specified eating disorder: Secondary | ICD-10-CM

## 2022-01-12 DIAGNOSIS — Z7985 Long-term (current) use of injectable non-insulin antidiabetic drugs: Secondary | ICD-10-CM

## 2022-01-12 DIAGNOSIS — Z6841 Body Mass Index (BMI) 40.0 and over, adult: Secondary | ICD-10-CM | POA: Diagnosis not present

## 2022-01-12 DIAGNOSIS — E669 Obesity, unspecified: Secondary | ICD-10-CM | POA: Diagnosis not present

## 2022-01-12 MED ORDER — TRULICITY 1.5 MG/0.5ML ~~LOC~~ SOAJ: 1.5000 mg | SUBCUTANEOUS | 0 refills | Status: DC

## 2022-01-12 MED ORDER — TOPIRAMATE 25 MG PO TABS: 25.0000 mg | ORAL_TABLET | Freq: Two times a day (BID) | ORAL | 0 refills | Status: DC

## 2022-01-13 NOTE — Progress Notes (Signed)
? ? ? ?Chief Complaint:  ? ?OBESITY ?Robin [name] is here to discuss her progress with her obesity treatment plan along with follow-up of her obesity related diagnoses. Robin [name] is on the Category 3 Plan and states she is following her eating plan approximately 40.5% of the time. Robin [name] states she is doing chair exercise for 30 minutes 3 times per week. ? ?Today's visit was #: 10 ?Starting weight: 310 lbs ?Starting date: 06/03/2021 ?Today's weight: 283 lbs ?Today's date: 01/12/2022 ?Total lbs lost to date: 27 lbs ?Total lbs lost since last in-office visit: 1 lb ? ?Interim History: Robin [name] is down 1 additional pound since her last visit.  ? ?Subjective:  ? ?1. Diabetes mellitus type 2 in obese Robin [name]) ?Robin [name] notes occasional nausea with the medication. Her appetite has increased.  ? ?2. Other disorder of eating ?Robin [name] states Topamax helps with emotional eating.  ? ?Assessment/Plan:  ? ?1. Diabetes mellitus type 2 in obese Robin [name]) ?We will refill Trulicity 1.5 mg for 1 month with no refills. Good blood sugar control is important to decrease the likelihood of diabetic complications such as nephropathy, neuropathy, limb loss, blindness, coronary artery disease, and death. Intensive lifestyle modification including diet, exercise and weight loss are the first line of treatment for diabetes.  ? ?- Dulaglutide (TRULICITY) 1.5 ZO/1.0RU SOPN; Inject 1.5 mg into the skin once a week.  Dispense: 2 mL; Refill: 0 ? ?2. Other disorder of eating ?We will refill Topamax 25 mg twice daily with no refills. She will increase raw vegetables and fruit instead of crackers or ice cream. Behavior modification techniques were discussed today to help Robin [name] deal with her emotional/non-hunger eating behaviors.  Orders and follow up as documented in patient record.  ? ?- topiramate (TOPAMAX) 25 MG tablet; Take 1 tablet (25 mg total) by mouth 2 (two) times daily.  Dispense: 60 tablet; Refill: 0 ? ?3. Obesity, current BMI 43.19 ?Robin [name] is currently  in the action stage of change. As such, her goal is to continue with weight loss efforts. She has agreed to the Category 3 Plan.  ? ?Robin [name] will continue meal planning and she will adhere closely to the plan 80-90%. ? ?Exercise goals:  As is.  ? ?Behavioral modification strategies: increasing lean protein intake, decreasing simple carbohydrates, increasing vegetables, increasing water intake, decreasing eating out, no skipping meals, meal planning and cooking strategies, keeping healthy foods in the home, and planning for success. ? ?Robin [name] has agreed to follow-up with our clinic in 4 weeks with nurse practitioner and 8 weeks with myself. She was informed of the importance of frequent follow-up visits to maximize her success with intensive lifestyle modifications for her multiple health conditions.  ? ?Objective:  ? ?Pulse 60, temperature 98 ?F (36.7 ?C), height '5\' 8"'$  (1.727 m), weight 283 lb (128.4 kg), SpO2 98 %. ?Body mass index is 43.03 kg/m?. ? ?General: Cooperative, alert, well developed, in no acute distress. ?HEENT: Conjunctivae and lids unremarkable. ?Cardiovascular: Regular rhythm.  ?Lungs: Normal work of breathing. ?Neurologic: No focal deficits.  ? ?Lab Results  ?Component Value Date  ? CREATININE 0.90 11/17/2021  ? BUN 11 11/17/2021  ? NA 143 11/17/2021  ? K 4.4 11/17/2021  ? CL 103 11/17/2021  ? CO2 26 11/17/2021  ? ?Lab Results  ?Component Value Date  ? ALT 20 11/17/2021  ? AST 20 11/17/2021  ? ALKPHOS 70 11/17/2021  ? BILITOT 0.4 11/17/2021  ? ?Lab Results  ?Component Value Date  ? HGBA1C 5.7 (H) 11/17/2021  ? HGBA1C  5.7 11/10/2021  ? HGBA1C 6.1 (H) 06/03/2021  ? HGBA1C 6.7 12/02/2020  ? HGBA1C 6.7 (H) 02/15/2020  ? ?Lab Results  ?Component Value Date  ? INSULIN 28.2 (H) 11/17/2021  ? INSULIN 48.4 (H) 06/03/2021  ? ?Lab Results  ?Component Value Date  ? TSH 0.891 11/10/2021  ? ?Lab Results  ?Component Value Date  ? CHOL 121 06/03/2021  ? HDL 52 06/03/2021  ? [place] 54 06/03/2021  ? TRIG 73  06/03/2021  ? CHOLHDL 2.3 06/03/2021  ? ?Lab Results  ?Component Value Date  ? VD25OH 61.7 06/03/2021  ? ?Lab Results  ?Component Value Date  ? WBC 12.0 (H) 01/04/2021  ? HGB 12.8 01/04/2021  ? HCT 41.0 01/04/2021  ? MCV 94.5 01/04/2021  ? PLT 411 (H) 01/04/2021  ? ?Lab Results  ?Component Value Date  ? IRON 31 03/04/2015  ? TIBC 340 03/04/2015  ? FERRITIN 34 03/04/2015  ? ?Attestation Statements:  ? ?Reviewed by clinician on day of visit: allergies, medications, problem list, medical history, surgical history, family history, social history, and previous encounter notes. ? ?I, Lizbeth Bark, RMA, am acting as transcriptionist for [employer], DO. ? ?I have reviewed the above documentation for accuracy and completeness, and I agree with the above. Jearld Lesch, DO ? ?

## 2022-01-14 ENCOUNTER — Encounter (INDEPENDENT_AMBULATORY_CARE_PROVIDER_SITE_OTHER): Payer: Self-pay | Admitting: Bariatrics

## 2022-01-18 ENCOUNTER — Other Ambulatory Visit: Payer: Self-pay | Admitting: Physician Assistant

## 2022-01-21 ENCOUNTER — Telehealth (HOSPITAL_COMMUNITY): Payer: Medicaid Other | Admitting: Psychiatry

## 2022-01-22 ENCOUNTER — Ambulatory Visit (INDEPENDENT_AMBULATORY_CARE_PROVIDER_SITE_OTHER): Payer: Medicaid Other | Admitting: Clinical

## 2022-01-22 DIAGNOSIS — F341 Dysthymic disorder: Secondary | ICD-10-CM

## 2022-01-22 NOTE — Progress Notes (Signed)
?  THERAPIST PROGRESS NOTE ? ? ? ?Session Time: 40 minutes ? ?Participation Level: Active ? ?Behavioral Response: CasualAlertDepressed ? ?Type of Therapy: Individual Therapy ? ?Treatment Goals addressed: client will complete 80% of homework ? ?ProgressTowards Goals: Progressing ? ?Interventions: CBT and Supportive ? ?Summary:  ?Robin [name] is a 66 y.o. female who presents for the scheduled session oriented times five, appropriately dressed and friendly. Client denied hallucinations and delusions. ?Client reported on today she is doing the same. Client reported she has her good and bad days. Client reported living with her husband continues to be a daily challenge. Client reported her husband continues to make things intentionally difficult. Client reported he says things to make it her fault why things are how they have been going. Client reported it does give her thoughts and feelings of guilt. Client reported she does have moments of anger but tries her best to redirect by reminding herself she has a lot to be grateful for. Client reported she always thought of herself as a good mother and wife. Client reported she has been very proud of seeing her kids through their education and they have gone on to be professionals and creating their families. Client reported she talks to her eldest daughter who is understanding of what is really going on and is good support. Client reported she looks forward to seeing her kids in a few months. Client reported she has been disappointed that she was denied disability. Client reported January 2023 they told her she does qualify for disability for denied her because her husband is receiving disability. Client reported she is medication compliant but still has days with crying spells and sad mood. Client reported it is hard to cope and think about moving forward when she lives with her husband. ?Evidence of progress towards goal:  client reported she is able to practice cognitive  restructuring by thinking of what she is grateful for at least 4 out 7 days per week. ? ? ? ?Suicidal/Homicidal: Nowithout intent/plan ? ?Therapist Response:  ?Therapist began the appointment asking the client how she has been feeling since the last session. ?Therapist used CBT to engage using active listening and positive emotional support towards her thoughts and feelings. ?Therapist used CBT to encourage the client to discuss her thoughts and feelings regarding the difficulty in her relationship and how it makes her feel about herself. ?Therapist used CBT to normalize the clients emotions. ?Therapist used CBT to discuss forgiveness for herself and others to help cope with having emotional stability. ?Therapist used CBT ask the client to identify her progress with frequency of use with coping skills with continued practice in her daily activity.    ?Therapist assigned the client homework to continue practicing gratitude. ?Client was scheduled for next appointments. ? ? ? ?Plan: Return again in 5 weeks. ? ?Diagnosis: persistent depressive disorder ? ?Collaboration of Care: Patient refused AEB none requested by the client. ? ?Patient/Guardian was advised Release of Information must be obtained prior to any record release in order to collaborate their care with an outside provider. Patient/Guardian was advised if they have not already done so to contact the registration department to sign all necessary forms in order for [name] to release information regarding their care.  ? ?Consent: Patient/Guardian gives verbal consent for treatment and assignment of benefits for services provided during this visit. Patient/Guardian expressed understanding and agreed to proceed.  ? ?Birdena Jubilee Ramere Downs, LCSW ?01/22/2022 ? ?

## 2022-01-23 DIAGNOSIS — M25561 Pain in right knee: Secondary | ICD-10-CM | POA: Insufficient documentation

## 2022-01-23 DIAGNOSIS — M48061 Spinal stenosis, lumbar region without neurogenic claudication: Secondary | ICD-10-CM | POA: Insufficient documentation

## 2022-01-23 DIAGNOSIS — M5416 Radiculopathy, lumbar region: Secondary | ICD-10-CM | POA: Insufficient documentation

## 2022-01-28 ENCOUNTER — Ambulatory Visit
Admission: RE | Admit: 2022-01-28 | Discharge: 2022-01-28 | Disposition: A | Discharge status: Home / Self Care | Payer: Medicaid Other | Source: Ambulatory Visit | Attending: Internal Medicine | Admitting: Internal Medicine

## 2022-01-28 DIAGNOSIS — Z1231 Encounter for screening mammogram for malignant neoplasm of breast: Secondary | ICD-10-CM

## 2022-01-30 ENCOUNTER — Encounter: Payer: Medicaid Other | Attending: Internal Medicine | Admitting: Dietician

## 2022-01-30 ENCOUNTER — Encounter: Payer: Self-pay | Admitting: Dietician

## 2022-01-30 DIAGNOSIS — E1169 Type 2 diabetes mellitus with other specified complication: Secondary | ICD-10-CM | POA: Diagnosis present

## 2022-01-30 DIAGNOSIS — Z713 Dietary counseling and surveillance: Secondary | ICD-10-CM | POA: Diagnosis not present

## 2022-01-30 DIAGNOSIS — Z6841 Body Mass Index (BMI) 40.0 and over, adult: Secondary | ICD-10-CM | POA: Diagnosis not present

## 2022-01-30 DIAGNOSIS — E119 Type 2 diabetes mellitus without complications: Secondary | ICD-10-CM

## 2022-01-30 NOTE — Patient Instructions (Addendum)
Fruit salad or sugar free jello with fruit after dinner rather than pie or other dessert most often. ?Homemade vegetable soup with lunch rather than chips ?Look for yogurt that has less sugar. ? ?Exercise rather than eat when you are stressed.  (Use your emotion for something positive.) ? ?[employer] You tube dance exercise videos. ?Continue to exercise consistently. ?

## 2022-01-30 NOTE — Progress Notes (Signed)
?Diabetes Self-Management Education ? ?Visit Type: Follow-up ? ?Appt. Start Time: 1045 Appt. End Time: 2094 ? ?01/30/2022 ? ?[name], identified by name and date of birth, is a 66 y.o. female with a diagnosis of Diabetes:  .  ? ?ASSESSMENT ?Patient is here today alone.  She was last seen by myself 06/02/2021. ? ?She has been loosing weight (50 lbs in the past year).  She has been going to [employer] [facility]. ?She states that her strength is good.  Uses a motorized wheel chair. ?She is doing chair exercises 3 times per week for 30 minutes. ?Her husband does most of the cooking.  Her husband just finished a course of radiation and takes chemo currently. ?Currently missing top teeth and therefore unable to eat raw vegetables. ?Gets food stamps but the amount has been cut. ?Eats out rarely.  Burger once per month ? ?She states that she gets menus from Dr. Owens Shark but states that she needs more guidance about portions and vegetables and feels that she is not losing weight fast enough. ? ?History includes  Type 2 diabetes, neuropathy, HTN, HLD, hypothyroidism ?Medications inlcude Metformin, Trulicity, lasix, hydrochlorothiazide, spirolactone, synthroid, MVI, vitamin D, Omega 3,Linzess, Topamax (for emotional eating) ?A1C 5.7% 11/17/2021 decreased from 6.7% 11/24/2020.  Insulin also decreased to 28 11/17/2021 ?  ?Weight hx: ?285 lbs 01/29/2022 ?316 lbs 06/02/2021 ?335 lbs 02/10/2021 ?319 lbs 10/2017 ?350 highest adult weight ?175 lbs lowest adult weight ?Gained weight with pregnancy and continued to gain after last baby and increased stress at that point.  She stated that food was her comfort.  She became less active.  Had increased depression. ?Asked patient if husband is trying to keep patient from losing weight.  She states that she has asked this question as well but does not feel that he is. ?  ?Patient lives with her husband.  He does most of the shopping and cooking.  Patient states that  her husband is helpful and tries to stick to a healthy plan often but get off at times.  Patient states that snacking is her problem, particularly emotional eating and states that anything sets her off easily.  Her husband has stage 2 lung cancer (started in kidney).   ?Increased stress due to relationship at times. ?She is working on disability.  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 ?Patient used to walk but now uses an electric wheel chair due to arthritis.   ?She states that she needs knee surgery but does not currently qualify due to her weight. ?She receives steroid shots in her knees weekly. ?She has been referred to [facility]. ?She goes to physical therapy weekly. ?She does the PT exercises and/or armchair exercises daily.  Increased frequency of exercise makes her hurt more ? ?Weight 285 lb (129.3 kg). ?Body mass index is 43.33 kg/m?. ? ? Diabetes Self-Management Education - 01/30/22 1636   ? ?  ? Visit Information  ? Visit Type Follow-up   ?  ? Psychosocial Assessment  ? Patient Belief/Attitude about Diabetes Motivated to manage diabetes   ? What is the hardest part about your diabetes right now, causing you the most concern, or is the most worrisome to you about your diabetes?   Making healty food and beverage choices   ? Self-care barriers None   ? Self-management support Doctor's office;CDE visits   ? Other persons present Patient   ? Patient Concerns Nutrition/Meal planning;Weight Control   ?  Special Needs None   ? Preferred Learning Style No preference indicated   ? Learning Readiness Ready   ? How often do you need to have someone help you when you read instructions, pamphlets, or other written materials from your doctor or pharmacy? 1 - Never   ?  ? Pre-Education Assessment  ? Patient understands the diabetes disease and treatment process. Demonstrates understanding / competency   ? Patient understands incorporating nutritional management into lifestyle. Needs Review   ? Patient  undertands incorporating physical activity into lifestyle. Demonstrates understanding / competency   ? Patient understands using medications safely. Demonstrates understanding / competency   ? Patient understands monitoring blood glucose, interpreting and using results Demonstrates understanding / competency   ? Patient understands prevention, detection, and treatment of acute complications. Demonstrates understanding / competency   ? Patient understands prevention, detection, and treatment of chronic complications. Demonstrates understanding / competency   ? Patient understands how to develop strategies to address psychosocial issues. Demonstrates understanding / competency   ? Patient understands how to develop strategies to promote health/change behavior. Needs Review   ?  ? Complications  ? Last HgB A1C per patient/outside source 5.7 %   ? How often do you check your blood sugar? 1-2 times/day   ? Fasting Blood glucose range (mg/dL) 70-129   ? Number of hyperglycemic episodes ( >'200mg'$ /dL): Never   ?  ? Dietary Intake  ? Breakfast eggs, fish patty, occasional grits or homemade oatmeal OR cereal, toast with 1 tsp butter   9  ? Snack (morning) none   ? Lunch skips   ? Snack (afternoon) ham sandwich, chips, occasional regular yogur   2-3  ? Dinner baked chicken, mashed potatoes, broccoli   6:30  ? Snack (evening) pie (although not hungry)   ? Beverage(s) flavored water, regular soda occasionally, coffee with creamer   ?  ? Activity / Exercise  ? Activity / Exercise Type Light (walking / raking leaves)   ? How many days per week do you exercise? 3   ? How many minutes per day do you exercise? 30   ? Total minutes per week of exercise 90   ?  ? Patient Education  ? Previous Diabetes Education Yes (please comment)   [id number]/[id number]  ? Healthy Eating Plate Method;Other (comment)   tips to help with emotional eating, snack choices, meal timing  ? Being Active Helped patient identify appropriate exercises in relation to his/her  diabetes, diabetes complications and other health issue.   ? Medications Reviewed patients medication for diabetes, action, purpose, timing of dose and side effects.   ? Monitoring Daily foot exams;Yearly dilated eye exam   ? Diabetes Stress and Support Identified and addressed patients feelings and concerns about diabetes   ?  ? Individualized Goals (developed by patient)  ? Nutrition General guidelines for healthy choices and portions discussed   ? Physical Activity Exercise 3-5 times per week;30 minutes per day   ? Medications take my medication as prescribed   ? Problem Solving Eating Pattern   ?  ? Patient Self-Evaluation of Goals - Patient rates self as meeting previously set goals (% of time)  ? Nutrition >75% (most of the time)   ? Physical Activity 50 - 75 % (half of the time)   ? Medications >75% (most of the time)   ? Monitoring >75% (most of the time)   ? Problem Solving and behavior change strategies  >75% (most of the time)   ?  Reducing Risk (treating acute and chronic complications) >23% (most of the time)   ? Health Coping >75% (most of the time)   ?  ? Post-Education Assessment  ? Patient understands the diabetes disease and treatment process. Demonstrates understanding / competency   ? Patient understands incorporating nutritional management into lifestyle. Demonstrates understanding / competency   ? Patient undertands incorporating physical activity into lifestyle. Demonstrates understanding / competency   ? Patient understands using medications safely. Demonstrates understanding / competency   ? Patient understands monitoring blood glucose, interpreting and using results Demonstrates understanding / competency   ? Patient understands prevention, detection, and treatment of acute complications. Demonstrates understanding / competency   ? Patient understands prevention, detection, and treatment of chronic complications. Demonstrates understanding / competency   ? Patient understands how to develop  strategies to address psychosocial issues. Demonstrates understanding / competency   ? Patient understands how to develop strategies to promote health/change behavior. Demonstrates understanding / compet

## 2022-02-01 ENCOUNTER — Other Ambulatory Visit (HOSPITAL_COMMUNITY): Payer: Self-pay | Admitting: Psychiatry

## 2022-02-01 ENCOUNTER — Other Ambulatory Visit: Payer: Self-pay | Admitting: Internal Medicine

## 2022-02-01 DIAGNOSIS — F341 Dysthymic disorder: Secondary | ICD-10-CM

## 2022-02-01 DIAGNOSIS — F411 Generalized anxiety disorder: Secondary | ICD-10-CM

## 2022-02-02 ENCOUNTER — Telehealth: Payer: Self-pay

## 2022-02-02 NOTE — Telephone Encounter (Signed)
Will forward to provider  

## 2022-02-02 NOTE — Telephone Encounter (Signed)
Contacted pt to go over mm results pt didn't answer lvm  ? ?Pt returned call and provided pt with results and pt doesn't have any questions or concerns ?

## 2022-02-03 NOTE — Telephone Encounter (Signed)
Patient called in requesting Tramadol '50mg'$   refill at preferred pharmacy  ?

## 2022-02-05 ENCOUNTER — Ambulatory Visit (INDEPENDENT_AMBULATORY_CARE_PROVIDER_SITE_OTHER): Payer: Medicaid Other | Admitting: Psychiatry

## 2022-02-05 DIAGNOSIS — F341 Dysthymic disorder: Secondary | ICD-10-CM

## 2022-02-05 MED ORDER — DULOXETINE HCL 30 MG PO CPEP: 90.0000 mg | ORAL_CAPSULE | Freq: Every day | ORAL | 0 refills | Status: DC

## 2022-02-05 NOTE — Progress Notes (Signed)
BH MD/PA/NP OP Progress Note ? ?02/05/2022 11:59 AM ?Robin [name]  ?MRN:  [id number] ? ?Virtual Visit via Telephone Note ? ?I connected with Robin [name] on 02/05/22 at 11:30 AM EDT by telephone and verified that I am speaking with the correct person using two identifiers. ? ?Location: ?Patient: home ?Provider: offsite ?  ?I discussed the limitations, risks, security and privacy concerns of performing an evaluation and management service by telephone and the availability of in person appointments. I also discussed with the patient that there may be a patient responsible charge related to this service. The patient expressed understanding and agreed to proceed. ? ?  ?I discussed the assessment and treatment plan with the patient. The patient was provided an opportunity to ask questions and all were answered. The patient agreed with the plan and demonstrated an understanding of the instructions. ?  ?The patient was advised to call back or seek an in-person evaluation if the symptoms worsen or if the condition fails to improve as anticipated. ? ?I provided 10 minutes of non-face-to-face time during this encounter. ? ? ?Franne Grip, NP  ? ?Chief Complaint: Medication management ? ? ?HPI: Robin [name] is a 66 year old female presenting to [facility] behavioral health outpatient for follow-up psychiatric evaluation.  She has a psychiatric history of major depressive disorder and anxiety.  Her symptoms are managed with Cymbalta 80 mg daily.  Patient reports that medication is not effective as it was when she first started.  Patient reports depressive episodes and being emotional.  She does report life stressors and continued interpersonal relationship issues with her husband.  Patient reports participating in therapy which is also effective with managing symptoms.  Patient requesting medication adjustment today.  Patient is open to increasing Cymbalta dose today.  Medication benefits versus risks discussed.  Cymbalta  increased from 80 mg daily to 90 mg daily. ? ? ?Visit Diagnosis:  ?  ICD-10-CM   ?1. Persistent depressive disorder  F34.1   ?  ? ? ?Past Psychiatric History: Major depressive disorder and anxiety ? ?Past Medical History:  ?Past Medical History:  ?Diagnosis Date  ? Anxiety disorder   ? Arthritis   ? Asthma   ? Back pain   ? Chronic neck pain   ? Constipation   ? Depression   ? Diabetes mellitus without complication ([place])   ? Dyspnea   ? Edema of lower extremity   ? Fibromyalgia   ? GERD (gastroesophageal reflux disease)   ? Heartburn   ? Hiatal hernia   ? small  ? Hyperlipidemia   ? Hypertension   ? Hypothyroidism   ? Joint pain   ? Morbid obesity with BMI of 50.0-59.9, adult ([place])   ? Osteoarthritis   ? Other fatigue   ? Schatzki's ring   ? non critical  ? Sinusitis   ? Sleep apnea   ? CPAP, Sleep study at [place]  ? SOB (shortness of breath) on exertion   ? Swallowing difficulty   ?  ?Past Surgical History:  ?Procedure Laterality Date  ? ABDOMINAL HYSTERECTOMY  02/18/2000  ? CARDIAC CATHETERIZATION  08/18/2003  ? normal L main/LAD/L Cfx/RCA (Dr. Adora Fridge)  ? COLONOSCOPY  2006  ? Dr. [name], hyperplastic polyps  ? COLONOSCOPY  08/06/2011  ? abnormal terminal ileum for 10cm, erosions, geographical ulceration. Bx small bowel mucosa with prominent intramucosal lymphoid aggregates, slightly inflammed  ? COLONOSCOPY WITH PROPOFOL N/A 12/28/2019  ? Rourk: 4 sessile serrated adenomas  removed on the colon.  Next colonoscopy in 3 years.  ? DIRECT LARYNGOSCOPY  03/15/2012  ? Procedure: DIRECT LARYNGOSCOPY;  Surgeon: [name], MD;  Location: [place];  Service: ENT;  Laterality: N/A; Dr. Wolicki--:>no foreign body seen. normal esophagus to 40cm  ? ESOPHAGEAL DILATION  04/17/2015  ? Procedure: ESOPHAGEAL DILATION;  Surgeon: [name], MD;  [facility];  Service: Endoscopy;;  ? ESOPHAGOGASTRODUODENOSCOPY  08/23/2008  ? OIN:OMVE distal esophageal erosions consistent with mild erosive  reflux esophagitis, otherwise unremarkable esophagus/ Tiny antral erosions of doubtful clinical significance, otherwise normal stomach, patent pylorus, normal D1 and D2  ? ESOPHAGOGASTRODUODENOSCOPY  08/06/2011  ? small hh, noncritical Schatzki's ring s/p 33 F  ? ESOPHAGOGASTRODUODENOSCOPY N/A 04/17/2015  ? [facility]:NOBSJG s/p dilation  ? ESOPHAGOGASTRODUODENOSCOPY (EGD) WITH PROPOFOL N/A 01/20/2018  ? Dr. [name]: Erosive gastropathy, normal-appearing esophagus status post empiric dilation.  Chronic gastritis, no H. pylori.  ? ESOPHAGOSCOPY  03/15/2012  ? Procedure: ESOPHAGOSCOPY;  Surgeon: [name], MD;  Location: [place];  Service: ENT;  Laterality: N/A;  ? KNEE ARTHROSCOPY  04/27/2001  ? MALONEY DILATION N/A 01/20/2018  ? Procedure: MALONEY DILATION;  Surgeon: [name], MD;  [facility];  Service: Endoscopy;  Laterality: N/A;  ? NM MYOCAR PERF WALL MOTION  2003  ? negative bruce protocol exercise tress test; EF 68%; intermediate risk study due to evidence of anterior wall ischemia extending from mid-ventricle to apex  ? PITUITARY SURGERY  06/2012  ? benign tumor, surgeon at [facility]  ? POLYPECTOMY  12/28/2019  ? Procedure: POLYPECTOMY;  Surgeon: [name], MD;  [facility];  Service: Endoscopy;;  ? RECTOCELE REPAIR    ? TRANSTHORACIC ECHOCARDIOGRAM  2003  ? EF normal  ? VAGINAL PROLAPSE REPAIR    ? VIDEO BRONCHOSCOPY Bilateral 03/08/2017  ? Procedure: VIDEO BRONCHOSCOPY WITHOUT FLUORO;  Surgeon: Javier Glazier, MD;  Location: [place];  Service: Cardiopulmonary;  Laterality: Bilateral;  ? ? ?Family Psychiatric History: N/A ? ?Family History:  ?Family History  ?Problem Relation Age of Onset  ? Diabetes Mother   ? Hypertension Mother   ? Heart Problems Mother   ? Hyperlipidemia Mother   ? Asthma Mother   ? Sleep apnea Mother   ? Obesity Mother   ? Diabetes Father   ? Kidney cancer Father   ? Hypertension Father   ? Hyperlipidemia Father   ? Sleep apnea Father   ?  Hypertension Sister   ? Allergic rhinitis Sister   ? Hypertension Sister   ? Hyperlipidemia Sister   ? Liver disease Brother   ? Arthritis Brother   ? Hypertension Brother   ? Hyperlipidemia Brother   ? Hypertension Brother   ? Breast cancer Maternal Grandmother   ? Kidney disease Maternal Grandfather   ? Breast cancer Paternal Grandmother   ? Eczema Daughter   ? Lupus Maternal Aunt   ? Stomach cancer Other   ?     aunt  ? Colon cancer Neg Hx   ? Immunodeficiency Neg Hx   ? Urticaria Neg Hx   ? ? ?Social History:  ?Social History  ? ?Socioeconomic History  ? Marital status: Married  ?  Spouse name: Not on file  ? Number of children: 4  ? Years of education: 10  ? Highest education level: Not on file  ?Occupational History  ?  Employer: UNEMPLOYED  ?Tobacco Use  ? Smoking status: Former  ?  Packs/day: 0.50  ?  Years: 10.00  ?  Pack years: 5.00  ?  Types: Cigarettes  ?  Start date: 07/14/1975  ?  Quit date: 04/04/1993  ?  Years since quitting: 28.8  ? Smokeless tobacco: Never  ? Tobacco comments:  ?  quit 20 yrs ago  ?Vaping Use  ? Vaping Use: Never used  ?Substance and Sexual Activity  ? Alcohol use: No  ?  Alcohol/week: 0.0 standard drinks  ? Drug use: No  ? Sexual activity: Yes  ?Other Topics Concern  ? Not on file  ?Social History Narrative  ? Lives with husband in an apartment on the first floor.  Has 4 children.    ? 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  Trying to get disability.  Formerly worked as a Teacher, early years/pre.  Education: 11th grade.  ?   ? [facility] (12/23/16):  ? Originally from [facility]. She was raised in [place]. 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 No pets currently. No bird exposure. She does have mold in her current home in the bathroom, laundry room, & master bedroom.   ? ?Social Determinants of Health  ? ?Financial Resource Strain: Not  on file  ?Food Insecurity: Not on file  ?Transportation Needs: Not on file  ?Physical Activity: Not on file  ?Stress: Not on file  ?Social Connections: Not on file  ? ? ?Allergies:  ?Allergies  ?Allergen Reactions

## 2022-02-09 ENCOUNTER — Ambulatory Visit (INDEPENDENT_AMBULATORY_CARE_PROVIDER_SITE_OTHER): Payer: Medicaid Other | Admitting: Podiatry

## 2022-02-09 DIAGNOSIS — L600 Ingrowing nail: Secondary | ICD-10-CM | POA: Diagnosis not present

## 2022-02-10 ENCOUNTER — Other Ambulatory Visit (INDEPENDENT_AMBULATORY_CARE_PROVIDER_SITE_OTHER): Payer: Self-pay | Admitting: Bariatrics

## 2022-02-10 ENCOUNTER — Other Ambulatory Visit: Payer: Self-pay | Admitting: Internal Medicine

## 2022-02-10 ENCOUNTER — Ambulatory Visit (INDEPENDENT_AMBULATORY_CARE_PROVIDER_SITE_OTHER): Payer: Medicaid Other | Admitting: Physician Assistant

## 2022-02-10 DIAGNOSIS — F5089 Other specified eating disorder: Secondary | ICD-10-CM

## 2022-02-10 DIAGNOSIS — E669 Obesity, unspecified: Secondary | ICD-10-CM

## 2022-02-12 ENCOUNTER — Other Ambulatory Visit (INDEPENDENT_AMBULATORY_CARE_PROVIDER_SITE_OTHER): Payer: Self-pay | Admitting: Bariatrics

## 2022-02-12 DIAGNOSIS — F5089 Other specified eating disorder: Secondary | ICD-10-CM

## 2022-02-15 NOTE — Progress Notes (Signed)
?  Subjective:  ?Patient ID: Robin [name], female    DOB: 10-07-1955,  MRN: [id number] ? ?Chief Complaint  ?Patient presents with  ? Follow-up  ?  3 weeks (around 01/29/2022) for nail re-check- bilateral great toes. Right great toe is tender to touch. No bleeding or draining pus. Pt is soaking with warm water and Epson salt and covering toes with a Band-Aid.   ? ? ?66 y.o. female presents with the above complaint. History confirmed with patient.  Returns for follow-up on ingrown nail procedure doing much better there is slight tenderness in the right great toe but seems to be healing well ? ?Objective:  ?Physical Exam: ?warm, good capillary refill, no trophic changes or ulcerative lesions, normal DP and PT pulses, normal sensory exam, matricectomy site is healing well ? ?Assessment:  ? ?1. Ingrown toenail without infection   ? ? ? ?Plan:  ?Patient was evaluated and treated and all questions answered. ? ?Ingrown Nail ?-Doing well can leave open to air discontinue soaks and ointment at this point.  Return to see me as needed if further issues with this or other problems. ? ?No follow-ups on file.  ? ?

## 2022-02-17 ENCOUNTER — Telehealth: Payer: Self-pay | Admitting: Internal Medicine

## 2022-02-17 ENCOUNTER — Ambulatory Visit: Payer: Medicaid Other | Attending: Internal Medicine | Admitting: Internal Medicine

## 2022-02-17 DIAGNOSIS — N3946 Mixed incontinence: Secondary | ICD-10-CM | POA: Diagnosis not present

## 2022-02-17 NOTE — Progress Notes (Signed)
Patient ID: Robin [name], female   DOB: Aug 17, 1956, 66 y.o.   MRN: [id number] ?Virtual Visit via Telephone Note ? ?I connected with Robin [name] on 02/17/2022 at 1:36 PM by telephone and verified that I am speaking with the correct person using two identifiers ? ?Location: ?Patient: home ?Provider: office ? ?Participants: ?Myself ?Patient ?  ?I discussed the limitations, risks, security and privacy concerns of performing an evaluation and management service by telephone and the availability of in person appointments. I also discussed with the patient that there may be a patient responsible charge related to this service. The patient expressed understanding and agreed to proceed. ? ? ?History of Present Illness: ?Patient with history of HTN, DM, OSA, fibromyalgia, depression, obesity,  GERD, hypothyroid, OA of multiple js (hands, knees, shoulders, hips), pituitary adenoma status post resection. ? ?Purpose of today's visit is to request incontinence supplies. ?Patient complains of urinary incontinence for about a year.  She is not able to hold her urine about 50% of the times when she gets the urge to urinate.  She also has incontinence of urine when she coughs, laughs or sneezes.  She is requesting incontinence undergarments for adults.  She also requests bed patents because sometimes at night she wets her bed because she is not able to make it to the restroom in time.  Incontinence is made worse by her being on furosemide. ? ?Outpatient Encounter Medications as of 02/17/2022  ?Medication Sig Note  ? hydrALAZINE (APRESOLINE) 50 MG tablet TAKE ONE TABLET BY MOUTH EVERY MORNING and TAKE ONE TABLET BY MOUTH EVERY EVENING   ? ACCU-CHEK AVIVA PLUS test strip USE AS DIRECTED   ? acetaminophen (TYLENOL) 500 MG tablet Take 1,000 mg by mouth 2 (two) times daily.    ? amLODipine (NORVASC) 5 MG tablet Take by mouth.   ? aspirin EC 81 MG tablet Take 81 mg by mouth at bedtime.   ? atorvastatin (LIPITOR) 40 MG tablet TAKE ONE  TABLET BY MOUTH EVERY EVENING   ? azelastine (ASTELIN) 0.1 % nasal spray 1-2 sprays per nostril 2 times daily as needed   ? CALCIUM CARBONATE ANTACID PO Take 2 tablets by mouth See admin instructions. Take 2 tablets by mouth every morning, may also take 2 tablets at night as needed for acid reflux 09/10/2020: Tapering off  ? celecoxib (CELEBREX) 100 MG capsule TAKE ONE CAPSULE BY MOUTH TWICE DAILY   ? CINNAMON PO Take 1 capsule by mouth daily.   ? conjugated estrogens (PREMARIN) vaginal cream Place 1 Applicatorful vaginally daily as needed (vaginal dryness/itching).   ? [employer] MAGNESIUM OXIDE 250 MG TABS Take 1 tablet by mouth daily.   ? Dulaglutide (TRULICITY) 1.5 ME/2.6ST SOPN Inject 1.5 mg into the skin once a week.   ? DULoxetine (CYMBALTA) 30 MG capsule Take 3 capsules (90 mg total) by mouth daily.   ? esomeprazole (NEXIUM) 40 MG capsule TAKE ONE CAPSULE BY MOUTH EVERY MORNING and TAKE ONE CAPSULE BY MOUTH EVERY EVENING   ? fluticasone (FLONASE) 50 MCG/ACT nasal spray Place 1 spray into both nostrils daily.   ? fluticasone (FLOVENT HFA) 220 MCG/ACT inhaler INHALE 2 INHALATIONS TWICE DAILY (Patient not taking: Reported on 06/03/2021) 07/27/2020: Pt states that she only took once - when she could not find her albuterol  ? furosemide (LASIX) 40 MG tablet TAKE 1/2 TO 1 TABLET BY MOUTH DAILY FOR LOWER EXTREMITY SWELLING   ? gabapentin (NEURONTIN) 300 MG capsule TAKE ONE CAPSULE BY MOUTH EVERY EVENING (  with '600mg'$  tab FOR $Remov'900mg'UMNnQi$  DOSE)   ? gabapentin (NEURONTIN) 600 MG tablet 1 tab po Q a.m and afternoon and 1 1/2 tab PO Q p.m   ? gabapentin (NEURONTIN) 600 MG tablet Take by mouth.   ? glucose blood test strip USE AS INSTRUCTED   ? glucose blood test strip USE AS INSTRUCTED   ? hydrochlorothiazide (HYDRODIURIL) 25 MG tablet TAKE ONE TABLET BY MOUTH EVERY MORNING   ? levothyroxine (SYNTHROID) 88 MCG tablet TAKE ONE TABLET BY MOUTH BEFORE BREAKFAST   ? linaclotide (LINZESS) 290 MCG CAPS capsule TAKE 1 CAPSULE BY MOUTH DAILY  BEFORE BREAKFAST.   ? losartan (COZAAR) 25 MG tablet Take 1 tablet (25 mg total) by mouth daily.   ? metFORMIN (GLUCOPHAGE) 500 MG tablet TAKE TWO TABLETS BY MOUTH EVERY MORNING and TAKE ONE TABLET EVERY EVENING   ? Metoprolol Tartrate 75 MG TABS TAKE ONE TABLET BY MOUTH EVERY MORNING and TAKE ONE TABLET BY MOUTH EVERY EVENING   ? montelukast (SINGULAIR) 10 MG tablet TAKE ONE TABLET BY MOUTH EVERY EVENING   ? Multiple Vitamin (MULTIVITAMIN WITH MINERALS) TABS tablet Take 1 tablet by mouth daily.   ? NARCAN 4 MG/0.1ML LIQD nasal spray kit    ? NEOMYCIN-POLYMYXIN-HYDROCORTISONE (CORTISPORIN) 1 % SOLN OTIC solution Apply to nail beds from procedure site twice daily after soaks   ? Omega-3 Fatty Acids (FISH OIL) 1000 MG CAPS Take 1,000 mg by mouth at bedtime.   ? spironolactone (ALDACTONE) 25 MG tablet Take 1 tablet (25 mg total) by mouth every morning.   ? tiZANidine (ZANAFLEX) 2 MG tablet Take by mouth.   ? topiramate (TOPAMAX) 25 MG tablet Take 1 tablet (25 mg total) by mouth 2 (two) times daily.   ? traMADol (ULTRAM) 50 MG tablet TAKE ONE TABLET BY MOUTH twice daily AS NEEDED   ? VENTOLIN HFA 108 (90 Base) MCG/ACT inhaler INHALE 1-2 PUFFS into THE lungs every SIX hours AS NEEDED FOR WHEEZING OR FOR SHORTNESS OF BREATH   ? vitamin B-12 (CYANOCOBALAMIN) 1000 MCG tablet Take 1,000 mcg by mouth daily.   ? Vitamin D, Ergocalciferol, (DRISDOL) 1.25 MG (50000 UNIT) CAPS capsule Take 1 capsule (50,000 Units total) by mouth once a week.   ? [DISCONTINUED] amitriptyline (ELAVIL) 50 MG tablet TAKE 1 TABLET BY MOUTH AT BEDTIME.   ? [DISCONTINUED] buPROPion (WELLBUTRIN XL) 300 MG 24 hr tablet TAKE 1 TABLET (300 MG TOTAL) BY MOUTH EVERY MORNING. (Patient taking differently: Take 300 mg by mouth daily.)   ? ?No facility-administered encounter medications on file as of 02/17/2022.  ? ? ?  ?Observations/Objective: ?No direct observation done as this was a telephone encounter. ? ?Assessment and Plan: ?1. Mixed stress and urge urinary  incontinence ?I will complete the form so that she can get adult incontinence underwear and bed pads.  She needs and would benefit from having them. ?Discussed Kegel's exercises and encouraged her to do them several times a day. ? ? ?Follow Up Instructions: ?As previously scheduled. ?  ?I discussed the assessment and treatment plan with the patient. The patient was provided an opportunity to ask questions and all were answered. The patient agreed with the plan and demonstrated an understanding of the instructions. ?  ?The patient was advised to call back or seek an in-person evaluation if the symptoms worsen or if the condition fails to improve as anticipated. ? ?I  Spent 7 minutes on this telephone encounter ? ?This note has been created with Dragon speech recognition  Engineer, drilling. Any transcriptional errors are unintentional. ? ?Karle Plumber, MD ? ?

## 2022-02-17 NOTE — Telephone Encounter (Signed)
Copied from [place] [id number]. Topic: General - Other ?>> Feb 17, 2022 11:17 AM Pawlus, Brayton Layman A wrote: ?Reason for CRM: Caller from Home care delivery [phone]) wanted to know if the office received her fax regarding the pts incontinence supplies, please advise.   ? ?Fax number - [phone] ?

## 2022-02-18 ENCOUNTER — Ambulatory Visit (INDEPENDENT_AMBULATORY_CARE_PROVIDER_SITE_OTHER): Payer: Medicaid Other | Admitting: Family Medicine

## 2022-02-18 ENCOUNTER — Encounter (INDEPENDENT_AMBULATORY_CARE_PROVIDER_SITE_OTHER): Payer: Self-pay | Admitting: Family Medicine

## 2022-02-18 ENCOUNTER — Other Ambulatory Visit (INDEPENDENT_AMBULATORY_CARE_PROVIDER_SITE_OTHER): Payer: Self-pay | Admitting: Bariatrics

## 2022-02-18 VITALS — BP 116/74 | HR 60 | Temp 98.3°F | Ht 68.0 in | Wt 279.0 lb

## 2022-02-18 DIAGNOSIS — E669 Obesity, unspecified: Secondary | ICD-10-CM

## 2022-02-18 DIAGNOSIS — F3289 Other specified depressive episodes: Secondary | ICD-10-CM

## 2022-02-18 DIAGNOSIS — E1169 Type 2 diabetes mellitus with other specified complication: Secondary | ICD-10-CM | POA: Diagnosis not present

## 2022-02-18 DIAGNOSIS — Z6841 Body Mass Index (BMI) 40.0 and over, adult: Secondary | ICD-10-CM

## 2022-02-18 DIAGNOSIS — M25561 Pain in right knee: Secondary | ICD-10-CM

## 2022-02-18 DIAGNOSIS — Z7985 Long-term (current) use of injectable non-insulin antidiabetic drugs: Secondary | ICD-10-CM

## 2022-02-18 MED ORDER — TRULICITY 1.5 MG/0.5ML ~~LOC~~ SOAJ: 1.5000 mg | SUBCUTANEOUS | 0 refills | Status: DC

## 2022-02-18 MED ORDER — TOPIRAMATE 25 MG PO TABS: 25.0000 mg | ORAL_TABLET | Freq: Two times a day (BID) | ORAL | 0 refills | Status: DC

## 2022-02-19 ENCOUNTER — Ambulatory Visit: Payer: Self-pay | Admitting: Orthopaedic Surgery

## 2022-02-25 ENCOUNTER — Ambulatory Visit (INDEPENDENT_AMBULATORY_CARE_PROVIDER_SITE_OTHER): Payer: Medicaid Other

## 2022-02-25 ENCOUNTER — Encounter: Payer: Self-pay | Admitting: Orthopaedic Surgery

## 2022-02-25 ENCOUNTER — Ambulatory Visit: Payer: Self-pay

## 2022-02-25 ENCOUNTER — Ambulatory Visit (INDEPENDENT_AMBULATORY_CARE_PROVIDER_SITE_OTHER): Payer: Medicaid Other | Admitting: Orthopaedic Surgery

## 2022-02-25 DIAGNOSIS — M170 Bilateral primary osteoarthritis of knee: Secondary | ICD-10-CM | POA: Diagnosis not present

## 2022-02-25 DIAGNOSIS — M1712 Unilateral primary osteoarthritis, left knee: Secondary | ICD-10-CM | POA: Diagnosis not present

## 2022-02-25 DIAGNOSIS — Z6841 Body Mass Index (BMI) 40.0 and over, adult: Secondary | ICD-10-CM | POA: Diagnosis not present

## 2022-02-25 MED ORDER — LIDOCAINE HCL 1 % IJ SOLN
2.0000 mL | INTRAMUSCULAR | Status: AC | PRN
  Administered 2022-02-25: 2 mL

## 2022-02-25 MED ORDER — BUPIVACAINE HCL 0.5 % IJ SOLN
2.0000 mL | INTRAMUSCULAR | Status: AC | PRN
  Administered 2022-02-25: 2 mL via INTRA_ARTICULAR

## 2022-02-25 MED ORDER — METHYLPREDNISOLONE ACETATE 40 MG/ML IJ SUSP
40.0000 mg | INTRAMUSCULAR | Status: AC | PRN
  Administered 2022-02-25: 40 mg via INTRA_ARTICULAR

## 2022-02-25 MED ORDER — TRAMADOL HCL 50 MG PO TABS
50.0000 mg | ORAL_TABLET | Freq: Every day | ORAL | 0 refills | Status: DC | PRN
Start: 2022-02-25 — End: 2022-05-07

## 2022-02-25 NOTE — Progress Notes (Signed)
Office Visit Note   Patient: Robin [name]           Date of Birth: [birth year]           MRN: [id number] Visit Date: 02/25/2022              Requested by: Ladell Pier, MD [address] [place],  [place] [id number] PCP: Ladell Pier, MD   Assessment & Plan: Visit Diagnoses:  1. Primary osteoarthritis of both knees   2. Body mass index 40.0-44.9, adult ([place])   3. Morbid obesity ([place])     Plan: Impression is severe bilateral varus deformity DJD with flexion contractures.  Because of her pain she sits most of the day and ambulates an electric wheelchair which has exacerbated the problem.  Again treatment options were discussed and she has done an excellent job of losing weight in the last year and is almost to the goal BMI of 40.  We did talk about possibility of going through with surgery if she was willing to accept the risks but she would like to lose more weight in order to mitigate surgical risks related to increased BMI.  We injected the left knee today hopefully this will give her some relief.  We will see her back as needed.  Follow-Up Instructions: No follow-ups on file.   Orders:  Orders Placed This Encounter  Procedures   XR KNEE 3 VIEW RIGHT   XR KNEE 3 VIEW LEFT   Meds ordered this encounter  Medications   traMADol (ULTRAM) 50 MG tablet    Sig: Take 1-2 tablets (50-100 mg total) by mouth daily as needed.    Dispense:  20 tablet    Refill:  0      Procedures: Large Joint Inj: L knee on 02/25/2022 10:35 AM Details: 22 G needle Medications: 2 mL bupivacaine 0.5 %; 2 mL lidocaine 1 %; 40 mg methylPREDNISolone acetate 40 MG/ML Outcome: tolerated well, no immediate complications Patient was prepped and draped in the usual sterile fashion.      Clinical Data: No additional findings.   Subjective: Chief Complaint  Patient presents with   Left Knee - Pain   Right Knee - Pain    HPI Robin [name] returns today for bilateral knee pain.  She  has severe DJD with varus deformity.  She has been in a electric wheelchair for about a year.  Recently had a right knee cortisone injection about a month ago which has not helped.  She has lost about 70 pounds in the last year. Review of Systems  Constitutional: Negative.   HENT: Negative.    Eyes: Negative.   Respiratory: Negative.    Cardiovascular: Negative.   Endocrine: Negative.   Musculoskeletal: Negative.   Neurological: Negative.   Hematological: Negative.   Psychiatric/Behavioral: Negative.    All other systems reviewed and are negative.   Objective: Vital Signs: There were no vitals taken for this visit.  Physical Exam Vitals and nursing note reviewed.  Constitutional:      Appearance: She is well-developed.  Pulmonary:     Effort: Pulmonary effort is normal.  Skin:    General: Skin is warm.     Capillary Refill: Capillary refill takes less than 2 seconds.  Neurological:     Mental Status: She is alert and oriented to person, place, and time.  Psychiatric:        Behavior: Behavior normal.        Thought Content:  Thought content normal.        Judgment: Judgment normal.    Ortho Exam Examination of bilateral knees show about 15 to 20 degree flexion contracture worse on the right.  Varus deformity.  Severe pain with any attempted range of motion.  Trace effusion.  2+ crepitus with range of motion. Specialty Comments:  No specialty comments available.  Imaging: No results found.   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   Polyp of descending colon 02/15/2020   Pituitary tumor 08/21/2019   Primary osteoarthritis of both hips 08/21/2019   Primary  osteoarthritis of both knees 08/21/2019   Pain in left shoulder 08/21/2019   Morbid obesity with BMI of 60.0-69.9, adult ([place]) 07/17/2019   Pedal edema 07/17/2019   Multilevel degenerative disc disease 10/31/2018   Abdominal pain 10/28/2018   Change in bowel function 10/28/2018   Severe episode of recurrent major depressive disorder, with psychotic features ([place]) 05/26/2018   Gastritis and gastroduodenitis 04/26/2018   Atrophic vaginitis 04/01/2018   Diabetes mellitus ([place]) 03/31/2018   Cough, persistent 02/08/2018   Dysphagia 12/14/2017   Generalized OA 07/30/2017   Urinary incontinence 07/23/2017   Early satiety 06/16/2017   Hx of iron deficiency anemia 05/28/2017   Throat pain in adult 04/05/2017   Prediabetes 02/04/2017   Dyspnea on exertion 12/23/2016   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 12/23/2016   Hemoptysis 12/23/2016   Fibromyalgia 08/18/2016   Chronic lymphocytic thyroiditis 08/18/2016   Depression 04/01/2016   OSA (obstructive sleep apnea) 04/01/2016   Essential hypertension 12/09/2015   Hypothyroidism 12/09/2015   Morbid obesity due to excess calories ([place]) 12/09/2015   Constipation 05/27/2015   Fatty liver 05/27/2015   Abdominal pain, chronic, epigastric 07/04/2012   Anxiety 06/09/2012   History of gastroesophageal reflux (GERD) 06/09/2012   Hyperlipidemia 06/09/2012   Refusal of blood transfusions as patient is Jehovah's Witness 06/09/2012   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   Abnormal small bowel biopsy 09/18/2011   Lactose intolerance 09/18/2011   GERD 01/21/2010   Past Medical History:  Diagnosis Date   Anxiety disorder    Arthritis    Asthma    Back pain    Chronic neck pain    Constipation    Depression    Diabetes mellitus without complication (HCC)    Dyspnea    Edema of lower extremity    Fibromyalgia    GERD (gastroesophageal reflux disease)    Heartburn    Hiatal hernia    small    Hyperlipidemia    Hypertension    Hypothyroidism    Joint pain    Morbid obesity with BMI of 50.0-59.9, adult (HCC)    Osteoarthritis    Other fatigue    Schatzki's ring    non critical   Sinusitis    Sleep apnea    CPAP, Sleep study at [place] [place]   SOB (shortness of breath) on exertion    Swallowing difficulty     Family History  Problem Relation Age of Onset   Diabetes Mother    Hypertension Mother    Heart Problems Mother    Hyperlipidemia Mother  Asthma Mother    Sleep apnea Mother    Obesity Mother    Diabetes Father    Kidney cancer Father    Hypertension Father    Hyperlipidemia Father    Sleep apnea Father    Hypertension Sister    Allergic rhinitis Sister    Hypertension Sister    Hyperlipidemia Sister    Liver disease Brother    Arthritis Brother    Hypertension Brother    Hyperlipidemia Brother    Hypertension Brother    Breast cancer Maternal Grandmother    Kidney disease Maternal Grandfather    Breast cancer Paternal Grandmother    Eczema Daughter    Lupus Maternal Aunt    Stomach cancer Other        aunt   Colon cancer Neg Hx    Immunodeficiency Neg Hx    Urticaria Neg Hx     Past Surgical History:  Procedure Laterality Date   ABDOMINAL HYSTERECTOMY  02/18/2000   CARDIAC CATHETERIZATION  08/18/2003   normal L main/LAD/L Cfx/RCA (Dr. Adora Fridge)   COLONOSCOPY  2006   Dr. [name], hyperplastic polyps   COLONOSCOPY  08/06/2011   abnormal terminal ileum for 10cm, erosions, geographical ulceration. Bx small bowel mucosa with prominent intramucosal lymphoid aggregates, slightly inflammed   COLONOSCOPY WITH PROPOFOL N/A 12/28/2019   Rourk: 4 sessile serrated adenomas removed on the colon.  Next colonoscopy in 3 years.   DIRECT LARYNGOSCOPY  03/15/2012   Procedure: DIRECT LARYNGOSCOPY;  Surgeon: [name], MD;  Location: [facility];  Service: ENT;  Laterality: N/A; Dr. Wolicki--:>no foreign body seen. normal esophagus to 40cm    ESOPHAGEAL DILATION  04/17/2015   Procedure: ESOPHAGEAL DILATION;  Surgeon: [name], MD;  [facility];  Service: Endoscopy;;   ESOPHAGOGASTRODUODENOSCOPY  08/23/2008   SFK:CLEX distal esophageal erosions consistent with mild erosive reflux esophagitis, otherwise unremarkable esophagus/ Tiny antral erosions of doubtful clinical significance, otherwise normal stomach, patent pylorus, normal D1 and D2   ESOPHAGOGASTRODUODENOSCOPY  08/06/2011   small hh, noncritical Schatzki's ring s/p 34 F   ESOPHAGOGASTRODUODENOSCOPY N/A 04/17/2015   NTZ:GYFVCB s/p dilation   ESOPHAGOGASTRODUODENOSCOPY (EGD) WITH PROPOFOL N/A 01/20/2018   Dr. [name]: Erosive gastropathy, normal-appearing esophagus status post empiric dilation.  Chronic gastritis, no H. pylori.   ESOPHAGOSCOPY  03/15/2012   Procedure: ESOPHAGOSCOPY;  Surgeon: [name], MD;  Location: [facility];  Service: ENT;  Laterality: N/A;   KNEE ARTHROSCOPY  04/27/2001   MALONEY DILATION N/A 01/20/2018   Procedure: Venia Minks DILATION;  Surgeon: [name], MD;  [facility];  Service: Endoscopy;  Laterality: N/A;   NM MYOCAR PERF WALL MOTION  2003   negative bruce protocol exercise tress test; EF 68%; intermediate risk study due to evidence of anterior wall ischemia extending from mid-ventricle to apex   PITUITARY SURGERY  06/2012   benign tumor, surgeon at [facility]   POLYPECTOMY  12/28/2019   Procedure: POLYPECTOMY;  Surgeon: [name], MD;  [facility];  Service: Endoscopy;;   RECTOCELE REPAIR     TRANSTHORACIC ECHOCARDIOGRAM  2003   EF normal   VAGINAL PROLAPSE REPAIR     VIDEO BRONCHOSCOPY Bilateral 03/08/2017   Procedure: VIDEO BRONCHOSCOPY WITHOUT FLUORO;  Surgeon: Javier Glazier, MD;  Location: [facility];  Service: Cardiopulmonary;  Laterality: Bilateral;   Social History   Occupational History    Employer: UNEMPLOYED  Tobacco Use   Smoking status: Former    Packs/day: 0.50  Years:  10.00    Pack years: 5.00    Types: Cigarettes    Start date: 07/14/1975    Quit date: 04/04/1993    Years since quitting: 28.9   Smokeless tobacco: Never   Tobacco comments:    quit 20 yrs ago  Vaping Use   Vaping Use: Never used  Substance and Sexual Activity   Alcohol use: No    Alcohol/week: 0.0 standard drinks   Drug use: No   Sexual activity: Yes

## 2022-03-04 ENCOUNTER — Encounter: Payer: Self-pay | Admitting: Gastroenterology

## 2022-03-04 ENCOUNTER — Other Ambulatory Visit: Payer: Self-pay | Admitting: Gastroenterology

## 2022-03-04 NOTE — Telephone Encounter (Signed)
Will refill X 1. It looks like she has an appt with [facility] next month. She can get future refills from them if she is transferring her care.

## 2022-03-04 NOTE — Progress Notes (Signed)
Chief Complaint:   OBESITY Robin [name] is here to discuss her progress with her obesity treatment plan along with follow-up of her obesity related diagnoses. [name] is on the Category 3 Plan and states she is following her eating plan approximately 50% of the time. [name] states she is doing chair exercises for 30 minutes 3 times per week.  Today's visit was #: 11 Starting weight: 310 lbs Starting date: 06/03/2021 Today's weight: 279 lbs Today's date: 02/18/2022 Total lbs lost to date: 31 Total lbs lost since last in-office visit: 4  Interim History: [name] continues to do well with weight loss. She hasn't been eating all of the protein on her plan, and this may be decreasing her RMR.  Subjective:   1. Right knee pain, unspecified chronicity [name]'s pain is worse recently. She is out of her pain medications, but she has an upcoming appointment. She is limited in walking due to her pain.  2. Diabetes mellitus type 2 in obese (HCC) [name] is working on her diet and exercise. She is on Trulicity with no side effects noted.   3. Other depression, with emotional eating binges [name] is stable on Topamax, and she denies a history of nephrolithiasis.   Assessment/Plan:   1. Right knee pain, unspecified chronicity [name] was encouraged to continue to do her chair exercises, and she was reassured weight loss would help take the pressure off of her joints. We will continue to follow.   2. Diabetes mellitus type 2 in obese [facility]) Brienne will continue Trulicity 1.5 mg q week, and we will refill for 1 month.  - Dulaglutide (TRULICITY) 1.5 UM/3.5TI SOPN; Inject 1.5 mg into the skin once a week.  Dispense: 2 mL; Refill: 0  3. Other depression, with emotional eating binges We will refill Topamax for 1 month. Behavior modification techniques were discussed today to help [name] deal with her emotional/non-hunger eating behaviors.  Orders and follow up as documented in patient record.    - topiramate (TOPAMAX) 25 MG tablet; Take 1 tablet (25 mg total) by mouth 2 (two) times daily.  Dispense: 60 tablet; Refill: 0  4. Obesity, Current BMI 42.5 [name] is currently in the action stage of change. As such, her goal is to continue with weight loss efforts. She has agreed to the Category 3 Plan.   Exercise goals: As is.  Behavioral modification strategies: increasing lean protein intake and no skipping meals.  [name] has agreed to follow-up with our clinic in 3 weeks. She was informed of the importance of frequent follow-up visits to maximize her success with intensive lifestyle modifications for her multiple health conditions.   Objective:   Blood pressure 116/74, pulse 60, temperature 98.3 F (36.8 C), height '5\' 8"'$  (1.727 m), weight 279 lb (126.6 kg), SpO2 97 %. Body mass index is 42.42 kg/m.  General: Cooperative, alert, well developed, in no acute distress. HEENT: Conjunctivae and lids unremarkable. Cardiovascular: Regular rhythm.  Lungs: Normal work of breathing. Neurologic: No focal deficits.   Lab Results  Component Value Date   CREATININE 0.90 11/17/2021   BUN 11 11/17/2021   NA 143 11/17/2021   K 4.4 11/17/2021   CL 103 11/17/2021   CO2 26 11/17/2021   Lab Results  Component Value Date   ALT 20 11/17/2021   AST 20 11/17/2021   ALKPHOS 70 11/17/2021   BILITOT 0.4 11/17/2021   Lab Results  Component Value Date   HGBA1C 5.7 (H) 11/17/2021   HGBA1C 5.7 11/10/2021  HGBA1C 6.1 (H) 06/03/2021   HGBA1C 6.7 12/02/2020   HGBA1C 6.7 (H) 02/15/2020   Lab Results  Component Value Date   INSULIN 28.2 (H) 11/17/2021   INSULIN 48.4 (H) 06/03/2021   Lab Results  Component Value Date   TSH 0.891 11/10/2021   Lab Results  Component Value Date   CHOL 121 06/03/2021   HDL 52 06/03/2021   LDLCALC 54 06/03/2021   TRIG 73 06/03/2021   CHOLHDL 2.3 06/03/2021   Lab Results  Component Value Date   VD25OH 61.7 06/03/2021   Lab Results  Component  Value Date   WBC 12.0 (H) 01/04/2021   HGB 12.8 01/04/2021   HCT 41.0 01/04/2021   MCV 94.5 01/04/2021   PLT 411 (H) 01/04/2021   Lab Results  Component Value Date   IRON 31 03/04/2015   TIBC 340 03/04/2015   FERRITIN 34 03/04/2015   Attestation Statements:   Reviewed by clinician on day of visit: allergies, medications, problem list, medical history, surgical history, family history, social history, and previous encounter notes.   I, Trixie Dredge, am acting as transcriptionist for Dennard Nip, MD.  I have reviewed the above documentation for accuracy and completeness, and I agree with the above. -  Dennard Nip, MD

## 2022-03-09 ENCOUNTER — Encounter (INDEPENDENT_AMBULATORY_CARE_PROVIDER_SITE_OTHER): Payer: Self-pay | Admitting: Bariatrics

## 2022-03-09 ENCOUNTER — Ambulatory Visit (INDEPENDENT_AMBULATORY_CARE_PROVIDER_SITE_OTHER): Payer: Medicaid Other | Admitting: Bariatrics

## 2022-03-09 VITALS — BP 115/72 | HR 59 | Temp 97.9°F | Ht 68.0 in | Wt 276.0 lb

## 2022-03-09 DIAGNOSIS — E669 Obesity, unspecified: Secondary | ICD-10-CM

## 2022-03-09 DIAGNOSIS — F3289 Other specified depressive episodes: Secondary | ICD-10-CM

## 2022-03-09 DIAGNOSIS — E1169 Type 2 diabetes mellitus with other specified complication: Secondary | ICD-10-CM

## 2022-03-09 DIAGNOSIS — Z7985 Long-term (current) use of injectable non-insulin antidiabetic drugs: Secondary | ICD-10-CM

## 2022-03-09 DIAGNOSIS — Z6841 Body Mass Index (BMI) 40.0 and over, adult: Secondary | ICD-10-CM

## 2022-03-09 MED ORDER — TRULICITY 3 MG/0.5ML ~~LOC~~ SOAJ: 3.0000 mg | SUBCUTANEOUS | 0 refills | Status: DC

## 2022-03-09 MED ORDER — TOPIRAMATE 25 MG PO TABS: 25.0000 mg | ORAL_TABLET | Freq: Two times a day (BID) | ORAL | 0 refills | Status: DC

## 2022-03-10 ENCOUNTER — Ambulatory Visit (INDEPENDENT_AMBULATORY_CARE_PROVIDER_SITE_OTHER): Payer: Medicaid Other | Admitting: Cardiovascular Disease

## 2022-03-10 ENCOUNTER — Encounter: Payer: Self-pay | Admitting: Cardiovascular Disease

## 2022-03-10 ENCOUNTER — Ambulatory Visit: Payer: Medicaid Other | Attending: Internal Medicine | Admitting: Internal Medicine

## 2022-03-10 DIAGNOSIS — Z7985 Long-term (current) use of injectable non-insulin antidiabetic drugs: Secondary | ICD-10-CM | POA: Diagnosis not present

## 2022-03-10 DIAGNOSIS — K219 Gastro-esophageal reflux disease without esophagitis: Secondary | ICD-10-CM | POA: Diagnosis not present

## 2022-03-10 DIAGNOSIS — G4733 Obstructive sleep apnea (adult) (pediatric): Secondary | ICD-10-CM | POA: Diagnosis not present

## 2022-03-10 DIAGNOSIS — I10 Essential (primary) hypertension: Secondary | ICD-10-CM | POA: Diagnosis present

## 2022-03-10 DIAGNOSIS — M158 Other polyosteoarthritis: Secondary | ICD-10-CM

## 2022-03-10 DIAGNOSIS — F32A Depression, unspecified: Secondary | ICD-10-CM | POA: Insufficient documentation

## 2022-03-10 DIAGNOSIS — E782 Mixed hyperlipidemia: Secondary | ICD-10-CM | POA: Diagnosis not present

## 2022-03-10 DIAGNOSIS — M159 Polyosteoarthritis, unspecified: Secondary | ICD-10-CM | POA: Insufficient documentation

## 2022-03-10 DIAGNOSIS — E119 Type 2 diabetes mellitus without complications: Secondary | ICD-10-CM | POA: Insufficient documentation

## 2022-03-10 DIAGNOSIS — Z79899 Other long term (current) drug therapy: Secondary | ICD-10-CM | POA: Diagnosis not present

## 2022-03-10 DIAGNOSIS — R0981 Nasal congestion: Secondary | ICD-10-CM | POA: Diagnosis not present

## 2022-03-10 DIAGNOSIS — E1169 Type 2 diabetes mellitus with other specified complication: Secondary | ICD-10-CM | POA: Diagnosis not present

## 2022-03-10 DIAGNOSIS — F321 Major depressive disorder, single episode, moderate: Secondary | ICD-10-CM

## 2022-03-10 DIAGNOSIS — Z6841 Body Mass Index (BMI) 40.0 and over, adult: Secondary | ICD-10-CM | POA: Diagnosis not present

## 2022-03-10 DIAGNOSIS — E1159 Type 2 diabetes mellitus with other circulatory complications: Secondary | ICD-10-CM

## 2022-03-10 DIAGNOSIS — M797 Fibromyalgia: Secondary | ICD-10-CM | POA: Diagnosis not present

## 2022-03-10 DIAGNOSIS — E785 Hyperlipidemia, unspecified: Secondary | ICD-10-CM | POA: Insufficient documentation

## 2022-03-10 DIAGNOSIS — H9202 Otalgia, left ear: Secondary | ICD-10-CM | POA: Diagnosis not present

## 2022-03-10 DIAGNOSIS — I152 Hypertension secondary to endocrine disorders: Secondary | ICD-10-CM

## 2022-03-10 DIAGNOSIS — E039 Hypothyroidism, unspecified: Secondary | ICD-10-CM | POA: Diagnosis not present

## 2022-03-10 LAB — POCT GLYCOSYLATED HEMOGLOBIN (HGB A1C): Hemoglobin A1C: 5.3 % (ref 4.0–5.6)

## 2022-03-10 LAB — GLUCOSE, POCT (MANUAL RESULT ENTRY): POC Glucose: 103 mg/dl — AB (ref 70–99)

## 2022-03-10 MED ORDER — CIPRO HC 0.2-1 % OT SUSP: 3.0000 [drp] | Freq: Two times a day (BID) | OTIC | 0 refills | Status: DC

## 2022-03-10 MED ORDER — NETI POT SINUS WASH 2300-700 MG NA KIT: PACK | NASAL | 0 refills | Status: AC

## 2022-03-10 NOTE — Progress Notes (Signed)
Patient ID: Robin [name], female    DOB: 03-03-1956  MRN: [id number]  CC: Chronic disease management  Subjective: Robin [name] is a 66 y.o. female who presents for chronic disease management Her concerns today include:  Patient with history of HTN, DM, OSA, fibromyalgia, depression, obesity,  GERD, hypothyroid, OA of multiple js (hands, knees, shoulders, hips), pituitary adenoma status post resection.  C/o aching in LT ear x 1 mth. Assoc with decrease hearing and feeling of fullness in it Uses Q-tip sometimes and water gets in ear. Little yellow drainage  DM/Obesity: Results for orders placed or performed in visit on 03/10/22  POCT glucose (manual entry)  Result Value Ref Range   POC Glucose 103 (A) 70 - 99 mg/dl  POCT glycosylated hemoglobin (Hb A1C)  Result Value Ref Range   Hemoglobin A1C 5.3 4.0 - 5.6 %   HbA1c POC (<> result, manual entry)     HbA1c, POC (prediabetic range)     HbA1c, POC (controlled diabetic range)     *Note: Due to a large number of results and/or encounters for the requested time period, some results have not been displayed. A complete set of results can be found in Results Review.  Down 34 lbs since starting [facility] program Tolerating Trulicity and Topamax.  Also on metformin 1 g in the morning and 500 mg in the evening. Not eating as much as before; dec craving for sweet Saw Dr Erlinda Hong recently.  Inj LT knee.  Offered TKR.  Agreed to wait and lose more wgh first Does more sitting at home than walking. Does not do a lot of walking due to pain in knees, hip and lower back.  Feels she has loss a lot of gluteal muscles because constant sitting. Buttock hurts from sitting.  No ulcers on buttock Has a walker at home but does not use it much.   Taking BP meds.  Did bring meds - Furosemide, Spiron, HCTZ, Metoprolol, Cozaar, Hydralazine. Checks BP daily.  Has  log with her:  124/71, 120/72, 134/74, 124/72, 119/68 Limiting salt in foods Taking and tolerating  Lipitor  Nasal congestion especially in a.m.  Uses Flonase QOD.  MDD: Plugged in with behavioral health.  Feels she is doing better. Patient Active Problem List   Diagnosis Date Noted   Body mass index 40.0-44.9, adult ([place]) 02/25/2022   Morbid obesity ([place]) 02/25/2022   Right knee pain 02/18/2022   Diabetes mellitus type 2 in obese ([place]) 02/18/2022   Lumbar foraminal stenosis 01/23/2022   Lumbar radiculopathy 01/23/2022   Pain in both knees 01/23/2022   Vitamin D deficiency 11/18/2021   Adjustment disorder with mixed disturbance of emotions and conduct 07/28/2020   Polyp of descending colon 02/15/2020   Pituitary tumor 08/21/2019   Primary osteoarthritis of both hips 08/21/2019   Primary osteoarthritis of both knees 08/21/2019   Pain in left shoulder 08/21/2019   Morbid obesity with BMI of 60.0-69.9, adult ([place]) 07/17/2019   Pedal edema 07/17/2019   Multilevel degenerative disc disease 10/31/2018   Abdominal pain 10/28/2018   Change in bowel function 10/28/2018   Severe episode of recurrent major depressive disorder, with psychotic features ([place]) 05/26/2018   Gastritis and gastroduodenitis 04/26/2018   Atrophic vaginitis 04/01/2018   Diabetes mellitus ([place]) 03/31/2018   Cough, persistent 02/08/2018   Dysphagia 12/14/2017   Generalized OA 07/30/2017   Urinary incontinence 07/23/2017   Early satiety 06/16/2017   Hx of iron deficiency anemia 05/28/2017   Throat pain in  adult 04/05/2017   Prediabetes 02/04/2017   Dyspnea on exertion 12/23/2016   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 12/23/2016   Hemoptysis 12/23/2016   Fibromyalgia 08/18/2016   Chronic lymphocytic thyroiditis 08/18/2016   Depression 04/01/2016   OSA (obstructive sleep apnea) 04/01/2016   Essential hypertension 12/09/2015   Hypothyroidism 12/09/2015   Morbid obesity due to excess calories ([place]) 12/09/2015   Constipation 05/27/2015   Fatty liver 05/27/2015    Abdominal pain, chronic, epigastric 07/04/2012   Anxiety 06/09/2012   History of gastroesophageal reflux (GERD) 06/09/2012   Hyperlipidemia 06/09/2012   Refusal of blood transfusions as patient is Jehovah's Witness 06/09/2012   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      aspirin EC 81 MG tablet Take 81 mg by mouth at bedtime.     atorvastatin (LIPITOR) 40 MG tablet TAKE ONE TABLET BY MOUTH EVERY EVENING 90 tablet 2   azelastine (ASTELIN) 0.1 % nasal spray 1-2 sprays per nostril 2 times daily as needed 30 mL 12   CALCIUM CARBONATE ANTACID PO Take 2 tablets by mouth See admin instructions. Take 2 tablets by mouth every morning, may also take 2 tablets at night as needed for acid reflux (Patient not taking: Reported on 03/10/2022)     celecoxib (CELEBREX) 100 MG capsule TAKE ONE CAPSULE BY MOUTH TWICE DAILY (Patient not taking: Reported on 03/10/2022) 180 capsule 1   CINNAMON PO Take 1 capsule by mouth daily.     Dulaglutide (TRULICITY) 3 ML/4.6TK SOPN Inject 3 mg as directed once a week. 2 mL 0   DULoxetine (CYMBALTA) 30 MG capsule Take 3 capsules (90 mg total) by mouth daily. (Patient not taking: Reported on 03/10/2022) 90 capsule 0   esomeprazole (NEXIUM) 40 MG capsule TAKE ONE CAPSULE BY MOUTH EVERY MORNING and TAKE ONE CAPSULE BY MOUTH EVERY EVENING 60 capsule 0   fluticasone (FLONASE) 50 MCG/ACT nasal spray Place 1 spray into both nostrils daily. 16 g 1   furosemide (LASIX) 40 MG tablet TAKE 1/2 TO 1 TABLET BY MOUTH DAILY FOR LOWER EXTREMITY SWELLING 90 tablet 2   gabapentin (NEURONTIN) 300 MG capsule TAKE ONE CAPSULE BY MOUTH EVERY EVENING (with 676m tab FOR 9032m DOSE) 30 capsule 6   gabapentin (NEURONTIN) 600 MG tablet 1 tab po Q a.m and afternoon and 1 1/2 tab PO Q p.m 315 tablet 2   gabapentin (NEURONTIN) 600 MG tablet Take by mouth.     glucose blood test strip USE AS INSTRUCTED     glucose blood test strip USE AS INSTRUCTED     hydrALAZINE (APRESOLINE) 50 MG tablet TAKE ONE TABLET BY MOUTH EVERY MORNING and TAKE ONE TABLET BY MOUTH EVERY EVENING 90 tablet 0   hydrochlorothiazide (HYDRODIURIL) 25 MG tablet TAKE ONE TABLET BY MOUTH EVERY MORNING 90 tablet 1   levothyroxine (SYNTHROID) 88 MCG tablet TAKE ONE TABLET BY MOUTH BEFORE BREAKFAST 90 tablet 3   linaclotide (LINZESS) 290 MCG CAPS capsule TAKE 1 CAPSULE BY MOUTH DAILY BEFORE BREAKFAST. 90 capsule 3   losartan (COZAAR) 25 MG tablet Take 1 tablet (25 mg total) by mouth daily. 90 tablet 2  metFORMIN (GLUCOPHAGE) 500 MG tablet TAKE TWO TABLETS BY MOUTH EVERY MORNING and TAKE ONE TABLET EVERY EVENING 270 tablet 2   Metoprolol Tartrate 75 MG TABS TAKE ONE TABLET BY MOUTH EVERY MORNING and TAKE ONE TABLET BY MOUTH EVERY EVENING 180 tablet 2   montelukast (SINGULAIR) 10 MG tablet TAKE ONE TABLET BY MOUTH EVERY EVENING 90 tablet 2   Multiple Vitamin (MULTIVITAMIN WITH MINERALS) TABS tablet Take 1 tablet by mouth daily.     NARCAN 4 MG/0.1ML LIQD nasal spray kit      Omega-3 Fatty Acids (FISH OIL) 1000 MG CAPS Take 1,000 mg by mouth at bedtime.     spironolactone (ALDACTONE) 25 MG tablet Take 1 tablet (25 mg total) by mouth every morning. 90 tablet 2   tiZANidine (ZANAFLEX) 2 MG tablet Take by mouth.     topiramate (TOPAMAX) 25 MG tablet Take 1 tablet (25 mg total) by mouth 2 (two) times daily. 60 tablet 0   traMADol (ULTRAM) 50 MG tablet TAKE ONE TABLET BY MOUTH twice daily AS NEEDED 60 tablet 2   traMADol (ULTRAM) 50 MG tablet Take 1-2 tablets (50-100 mg total) by mouth daily as needed. 20 tablet 0   VENTOLIN HFA 108 (90 Base) MCG/ACT inhaler INHALE 1-2 PUFFS into THE lungs every SIX hours AS NEEDED  FOR WHEEZING OR FOR SHORTNESS OF BREATH 8.5 g 6   vitamin B-12 (CYANOCOBALAMIN) 1000 MCG tablet Take 1,000 mcg by mouth daily.     Vitamin D, Ergocalciferol, (DRISDOL) 1.25 MG (50000 UNIT) CAPS capsule Take 1 capsule (50,000 Units total) by mouth once a week. 12 capsule 0   [DISCONTINUED] amitriptyline (ELAVIL) 50 MG tablet TAKE 1 TABLET BY MOUTH AT BEDTIME. 90 tablet 0   [DISCONTINUED] buPROPion (WELLBUTRIN XL) 300 MG 24 hr tablet TAKE 1 TABLET (300 MG TOTAL) BY MOUTH EVERY MORNING. (Patient taking differently: Take 300 mg by mouth daily.) 90 tablet 2   No current facility-administered medications on file prior to visit.    Allergies  Allergen Reactions   Amlodipine Besylate Other (See Comments)    Edema in lower extremities Other reaction(s): Other Edema in lower extremities   Celexa [Citalopram Hydrobromide] Other (See Comments)    "Made me feel out of it"   Gabapentin Other (See Comments)    Contributed to lower extremity edema   Diflucan [Fluconazole] Rash    Social History   Socioeconomic History   Marital status: Married    Spouse name: Not on file   Number of children: 4   Years of education: 10   Highest education level: Not on file  Occupational History    Employer: UNEMPLOYED  Tobacco Use   Smoking status: Former    Packs/day: 0.50    Years: 10.00    Pack years: 5.00    Types: Cigarettes    Start date: 07/14/1975    Quit date: 04/04/1993    Years since quitting: 28.9   Smokeless tobacco: Never   Tobacco comments:    quit 20 yrs ago  Vaping Use   Vaping Use: Never used  Substance and Sexual Activity   Alcohol use: No    Alcohol/week: 0.0 standard drinks   Drug use: No   Sexual activity: Yes  Other Topics Concern   Not on file  Social History Narrative   Lives with husband in an apartment on the first floor.  Has 4 children.     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  Trying to get disability.  Formerly worked as a Pensions consultant.  Education: 11th grade.      [facility] (12/23/16):   Originally from [facility]. She was raised in [place]. 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 No pets currently. No bird exposure. She does have mold in her current home in the bathroom, laundry room, & master bedroom.    Social Determinants of Health   Financial Resource Strain: Not on file  Food Insecurity: Not on file  Transportation Needs: Not on file  Physical Activity: Not on file  Stress: Not on file  Social Connections: Not on file  Intimate Partner Violence: Not on file    Family History  Problem Relation Age of Onset   Diabetes Mother    Hypertension Mother    Heart Problems Mother    Hyperlipidemia Mother    Asthma Mother    Sleep apnea Mother    Obesity Mother    Diabetes Father    Kidney cancer Father    Hypertension Father    Hyperlipidemia Father    Sleep apnea Father    Hypertension Sister    Allergic rhinitis Sister    Hypertension Sister    Hyperlipidemia Sister    Liver disease Brother    Arthritis Brother    Hypertension Brother    Hyperlipidemia Brother    Hypertension Brother    Breast cancer Maternal Grandmother    Kidney disease Maternal Grandfather    Breast cancer Paternal Grandmother    Eczema Daughter    Lupus Maternal Aunt    Stomach cancer Other        aunt   Colon cancer Neg Hx    Immunodeficiency Neg Hx    Urticaria Neg Hx     Past Surgical History:  Procedure Laterality Date   ABDOMINAL HYSTERECTOMY  02/18/2000   CARDIAC CATHETERIZATION  08/18/2003   normal L main/LAD/L Cfx/RCA (Dr. Adora Fridge)   COLONOSCOPY  2006   Dr. [name], hyperplastic polyps   COLONOSCOPY  08/06/2011   abnormal terminal ileum for 10cm, erosions, geographical ulceration. Bx small bowel mucosa with prominent intramucosal lymphoid aggregates, slightly inflammed    COLONOSCOPY WITH PROPOFOL N/A 12/28/2019   Rourk: 4 sessile serrated adenomas removed on the colon.  Next colonoscopy in 3 years.   DIRECT LARYNGOSCOPY  03/15/2012   Procedure: DIRECT LARYNGOSCOPY;  Surgeon: [name], MD;  Location: [facility];  Service: ENT;  Laterality: N/A; Dr. Wolicki--:>no foreign body seen. normal esophagus to 40cm   ESOPHAGEAL DILATION  04/17/2015   Procedure: ESOPHAGEAL DILATION;  Surgeon: [name], MD;  [facility];  Service: Endoscopy;;   ESOPHAGOGASTRODUODENOSCOPY  08/23/2008   NUU:VOZD distal esophageal erosions consistent with mild erosive reflux esophagitis, otherwise unremarkable esophagus/ Tiny antral erosions of doubtful clinical significance, otherwise normal stomach, patent pylorus, normal D1 and D2   ESOPHAGOGASTRODUODENOSCOPY  08/06/2011   small hh, noncritical Schatzki's ring s/p 62 F   ESOPHAGOGASTRODUODENOSCOPY N/A 04/17/2015   GUY:QIHKVQ s/p dilation   ESOPHAGOGASTRODUODENOSCOPY (EGD) WITH PROPOFOL N/A 01/20/2018   Dr. [name]: Erosive gastropathy, normal-appearing esophagus status post empiric dilation.  Chronic gastritis, no H. pylori.   ESOPHAGOSCOPY  03/15/2012   Procedure: ESOPHAGOSCOPY;  Surgeon: [name], MD;  Location: [facility];  Service: ENT;  Laterality: N/A;   KNEE ARTHROSCOPY  04/27/2001  MALONEY DILATION N/A 01/20/2018   Procedure: Venia Minks DILATION;  Surgeon: [name], MD;  [facility];  Service: Endoscopy;  Laterality: N/A;   NM MYOCAR PERF WALL MOTION  2003   negative bruce protocol exercise tress test; EF 68%; intermediate risk study due to evidence of anterior wall ischemia extending from mid-ventricle to apex   PITUITARY SURGERY  06/2012   benign tumor, [facility]   POLYPECTOMY  12/28/2019   Procedure: POLYPECTOMY;  Surgeon: [name], MD;  [facility];  Service: Endoscopy;;   RECTOCELE REPAIR     TRANSTHORACIC ECHOCARDIOGRAM  2003   EF normal   VAGINAL PROLAPSE  REPAIR     VIDEO BRONCHOSCOPY Bilateral 03/08/2017   Procedure: VIDEO BRONCHOSCOPY WITHOUT FLUORO;  Surgeon: Javier Glazier, MD;  Location: [facility];  Service: Cardiopulmonary;  Laterality: Bilateral;    ROS: Review of Systems Negative except as stated above  PHYSICAL EXAM: BP 109/71   Pulse 60   Temp 98.5 F (36.9 C) (Oral)   Resp 16   SpO2 95%   Wt Readings from Last 3 Encounters:  03/10/22 278 lb (126.1 kg)  03/09/22 276 lb (125.2 kg)  02/18/22 279 lb (126.6 kg)    Physical Exam  General appearance - alert, well appearing, morbidly obese female sitting in motorized wheelchair and in no distress Mental status - normal mood, behavior, speech, dress, motor activity, and thought processes Ears -right ear canal and tympanic membrane within normal limits.  Slight tenderness on palpation of the left pinna.  She has small amount of white exudate deep in the canal of the left ear Nose: Mild enlargement of nasal turbinates Neck - supple, no significant adenopathy Chest - clear to auscultation, no wheezes, rales or rhonchi, symmetric air entry Heart -regular rate rhythm.  Extremities -no lower extremity edema.     03/10/2022    9:07 AM 11/10/2021   11:04 AM 11/06/2021    1:09 PM  Depression screen PHQ 2/9  Decreased Interest 3 2   Down, Depressed, Hopeless 2 2   PHQ - 2 Score 5 4   Altered sleeping 1 2   Tired, decreased energy 2 2   Change in appetite 2 1   Feeling bad or failure about yourself  0 1   Trouble concentrating 1 1   Moving slowly or fidgety/restless 1 1   Suicidal thoughts 0 0   PHQ-9 Score 12 12   Difficult doing work/chores Somewhat difficult       Information is confidential and restricted. Go to Review Flowsheets to unlock data.       Latest Ref Rng & Units 11/17/2021    9:41 AM 01/04/2021    3:55 PM 07/27/2020   12:40 PM  CMP  Glucose 70 - 99 mg/dL 82   133   176    BUN 8 - 27 mg/dL _0 Creatinine 0.57 - 1.00 mg/dL 0.90   0.74    0.91    Sodium 134 - 144 mmol/L 143   137   139    Potassium 3.5 - 5.2 mmol/L 4.4   3.5   3.6    Chloride 96 - 106 mmol/L 103   100   98    CO2 20 - 29 mmol/L _1 Calcium 8.7 - 10.3 mg/dL 9.9   9.6   10.4    Total Protein  6.0 - 8.5 g/dL 7.4   7.9   7.8    Total Bilirubin 0.0 - 1.2 mg/dL 0.4   0.6   1.1    Alkaline Phos 44 - 121 IU/L 70   53   71    AST 0 - 40 IU/L 20   32   45    ALT 0 - 32 IU/L 20   35   56     Lipid Panel     Component Value Date/Time   CHOL 121 06/03/2021 0833   TRIG 73 06/03/2021 0833   HDL 52 06/03/2021 0833   CHOLHDL 2.3 06/03/2021 0833   CHOLHDL 2.3 12/16/2015 0950   VLDL 13 12/16/2015 0950   LDLCALC 54 06/03/2021 0833    CBC    Component Value Date/Time   WBC 12.0 (H) 01/04/2021 1555   RBC 4.34 01/04/2021 1555   HGB 12.8 01/04/2021 1555   HGB 12.0 01/20/2017 1001   HCT 41.0 01/04/2021 1555   HCT 38.2 01/20/2017 1001   HCT 41 06/25/2011 1053   PLT 411 (H) 01/04/2021 1555   PLT 356 01/20/2017 1001   MCV 94.5 01/04/2021 1555   MCV 90 01/20/2017 1001   MCV 91.1 06/25/2011 1053   MCH 29.5 01/04/2021 1555   MCHC 31.2 01/04/2021 1555   RDW 13.0 01/04/2021 1555   RDW 13.1 01/20/2017 1001   LYMPHSABS 4.6 (H) 01/04/2021 1555   LYMPHSABS 3.4 (H) 01/20/2017 1001   MONOABS 1.6 (H) 01/04/2021 1555   EOSABS 0.1 01/04/2021 1555   EOSABS 0.1 01/20/2017 1001   BASOSABS 0.1 01/04/2021 1555   BASOSABS 0.0 01/20/2017 1001    ASSESSMENT AND PLAN: 1. Type 2 diabetes mellitus with morbid obesity (HCC) At goal.  Continue Trulicity and metformin. Encouraged her to continue trying to eat healthy. Encourage patient to try to move more if this involves only standing for 5 minutes at the top of every hour during the day. - POCT glucose (manual entry) - POCT glycosylated hemoglobin (Hb A1C)  2. Hypertension associated with diabetes ([place]) At goal.  Continue current medications including metoprolol, spironolactone, HCTZ, Cozaar, hydralazine.  I will  have her change to taking furosemide as needed rather than every day.  3. Hyperlipidemia associated with type 2 diabetes mellitus ([place]) Continue Lipitor.  Last LDL was 54  4. Sinus congestion Continue Flonase nasal spray. Recommend doing Nettie pot saline rinses twice a week. - Sodium Chloride-Sodium Bicarb (NETI POT SINUS WASH) 2300-700 MG KIT; DO nasal rinse twice a week as needed  Dispense: 1 kit; Refill: 0  5. Ear pain, left We will treat as otitis externa.  6. Other osteoarthritis involving multiple joints Commended her on weight loss.  Hopefully she will continue to lose weight so that she can get knee replacement surgery.  7. Moderate major depression ([place]) Plugged in with behavioral health and reports she is doing well on medications     Patient was given the opportunity to ask questions.  Patient verbalized understanding of the plan and was able to repeat key elements of the plan.   This documentation was completed using Radio producer.  Any transcriptional errors are unintentional.  Orders Placed This Encounter  Procedures   POCT glucose (manual entry)   POCT glycosylated hemoglobin (Hb A1C)     Requested Prescriptions   Signed Prescriptions Disp Refills   Sodium Chloride-Sodium Bicarb (NETI POT SINUS WASH) 2300-700 MG KIT 1 kit 0    Sig: DO nasal rinse twice a  week as needed   ciprofloxacin-hydrocortisone (CIPRO HC) OTIC suspension 10 mL 0    Sig: Place 3 drops into the left ear 2 (two) times daily.    Return in about 4 months (around 07/10/2022).  Karle Plumber, MD, FACP

## 2022-03-10 NOTE — Progress Notes (Signed)
03/10/2022 Robin [name]   1956-07-28  [id number]  Primary Physician Marcine Matar, MD Primary Cardiologist: Runell Gess MD FACP, [place], [place], [place]  HPI:  Robin [name] is a 66 y.o.   morbidly overweight married African-American female mother of 4, grandmother of 7 grandchildren who I last saw in the office 10/31/2019.  She was referred by her PCP, Dr. Jonah Blue, for increasing dyspnea on exertion of unclear etiology. I performed cardiac catheterization on her 08/17/2002 for chest pain, shortness of breath and a false positive Cardiolite suggesting anterior ischemia versus breast attenuation.  Her coronary arteries were completely normal as was her LV function.  She is noticed increasing dyspnea on exertion over the last several months to years.  She has gained a significant amount of weight as well however.  She is being evaluated by Dr. Sherene Sires for pulmonary etiology.  She did have a coronary CTA performed 06/09/2018 that was entirely normal with a coronary calcium score of 0.  She has normal LV function by 2D echocardiography.   Since I saw her in the office 2-1/2 years ago she continues to do well.  She is somewhat wheelchair-bound because of difficulties with her knees and hips.  She is lost 70 pounds since I last saw her.  She denies chest pain or shortness of breath.   Current Meds  Medication Sig   ACCU-CHEK AVIVA PLUS test strip USE AS DIRECTED   acetaminophen (TYLENOL) 500 MG tablet Take 1,000 mg by mouth 2 (two) times daily.    aspirin EC 81 MG tablet Take 81 mg by mouth at bedtime.   atorvastatin (LIPITOR) 40 MG tablet TAKE ONE TABLET BY MOUTH EVERY EVENING   azelastine (ASTELIN) 0.1 % nasal spray 1-2 sprays per nostril 2 times daily as needed   CINNAMON PO Take 1 capsule by mouth daily.   Dulaglutide (TRULICITY) 3 MG/0.5ML SOPN Inject 3 mg as directed once a week.   esomeprazole (NEXIUM) 40 MG capsule TAKE ONE CAPSULE BY MOUTH EVERY MORNING and TAKE ONE CAPSULE BY  MOUTH EVERY EVENING   fluticasone (FLONASE) 50 MCG/ACT nasal spray Place 1 spray into both nostrils daily.   furosemide (LASIX) 40 MG tablet TAKE 1/2 TO 1 TABLET BY MOUTH DAILY FOR LOWER EXTREMITY SWELLING   gabapentin (NEURONTIN) 300 MG capsule TAKE ONE CAPSULE BY MOUTH EVERY EVENING (with 600mg  tab FOR 900mg  DOSE)   gabapentin (NEURONTIN) 600 MG tablet 1 tab po Q a.m and afternoon and 1 1/2 tab PO Q p.m   gabapentin (NEURONTIN) 600 MG tablet Take by mouth.   glucose blood test strip USE AS INSTRUCTED   glucose blood test strip USE AS INSTRUCTED   hydrALAZINE (APRESOLINE) 50 MG tablet TAKE ONE TABLET BY MOUTH EVERY MORNING and TAKE ONE TABLET BY MOUTH EVERY EVENING   hydrochlorothiazide (HYDRODIURIL) 25 MG tablet TAKE ONE TABLET BY MOUTH EVERY MORNING   levothyroxine (SYNTHROID) 88 MCG tablet TAKE ONE TABLET BY MOUTH BEFORE BREAKFAST   linaclotide (LINZESS) 290 MCG CAPS capsule TAKE 1 CAPSULE BY MOUTH DAILY BEFORE BREAKFAST.   losartan (COZAAR) 25 MG tablet Take 1 tablet (25 mg total) by mouth daily.   metFORMIN (GLUCOPHAGE) 500 MG tablet TAKE TWO TABLETS BY MOUTH EVERY MORNING and TAKE ONE TABLET EVERY EVENING   Metoprolol Tartrate 75 MG TABS TAKE ONE TABLET BY MOUTH EVERY MORNING and TAKE ONE TABLET BY MOUTH EVERY EVENING   montelukast (SINGULAIR) 10 MG tablet TAKE ONE TABLET BY MOUTH EVERY EVENING  Multiple Vitamin (MULTIVITAMIN WITH MINERALS) TABS tablet Take 1 tablet by mouth daily.   NARCAN 4 MG/0.1ML LIQD nasal spray kit    Omega-3 Fatty Acids (FISH OIL) 1000 MG CAPS Take 1,000 mg by mouth at bedtime.   Sodium Chloride-Sodium Bicarb (NETI POT SINUS WASH) 2300-700 MG KIT DO nasal rinse twice a week as needed   spironolactone (ALDACTONE) 25 MG tablet Take 1 tablet (25 mg total) by mouth every morning.   tiZANidine (ZANAFLEX) 2 MG tablet Take by mouth.   topiramate (TOPAMAX) 25 MG tablet Take 1 tablet (25 mg total) by mouth 2 (two) times daily.   traMADol (ULTRAM) 50 MG tablet TAKE ONE  TABLET BY MOUTH twice daily AS NEEDED   traMADol (ULTRAM) 50 MG tablet Take 1-2 tablets (50-100 mg total) by mouth daily as needed.   VENTOLIN HFA 108 (90 Base) MCG/ACT inhaler INHALE 1-2 PUFFS into THE lungs every SIX hours AS NEEDED FOR WHEEZING OR FOR SHORTNESS OF BREATH   vitamin B-12 (CYANOCOBALAMIN) 1000 MCG tablet Take 1,000 mcg by mouth daily.   Vitamin D, Ergocalciferol, (DRISDOL) 1.25 MG (50000 UNIT) CAPS capsule Take 1 capsule (50,000 Units total) by mouth once a week.     Allergies  Allergen Reactions   Amlodipine Besylate Other (See Comments)    Edema in lower extremities Other reaction(s): Other Edema in lower extremities   Celexa [Citalopram Hydrobromide] Other (See Comments)    "Made me feel out of it"   Gabapentin Other (See Comments)    Contributed to lower extremity edema   Diflucan [Fluconazole] Rash    Social History   Socioeconomic History   Marital status: Married    Spouse name: Not on file   Number of children: 4   Years of education: 10   Highest education level: Not on file  Occupational History    Employer: UNEMPLOYED  Tobacco Use   Smoking status: Former    Packs/day: 0.50    Years: 10.00    Pack years: 5.00    Types: Cigarettes    Start date: 07/14/1975    Quit date: 04/04/1993    Years since quitting: 28.9   Smokeless tobacco: Never   Tobacco comments:    quit 20 yrs ago  Vaping Use   Vaping Use: Never used  Substance and Sexual Activity   Alcohol use: No    Alcohol/week: 0.0 standard drinks   Drug use: No   Sexual activity: Yes  Other Topics Concern   Not on file  Social History Narrative   Lives with husband in an apartment on the first floor.  Has 4 children.     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  Trying to get disability.  Formerly worked as a Teacher, early years/pre.  Education: 11th grade.      [facility] (12/23/16):   Originally from [facility]. She was raised in [place]. 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 No pets currently. No bird exposure. She does have mold in her current home in the bathroom, laundry room, & master bedroom.    Social Determinants of Health   Financial Resource Strain: Not on file  Food Insecurity: Not on file  Transportation Needs: Not on file  Physical Activity: Not on file  Stress: Not on file  Social Connections: Not on  file  Intimate Partner Violence: Not on file     Review of Systems: General: negative for chills, fever, night sweats or weight changes.  Cardiovascular: negative for chest pain, dyspnea on exertion, edema, orthopnea, palpitations, paroxysmal nocturnal dyspnea or shortness of breath Dermatological: negative for rash Respiratory: negative for cough or wheezing Urologic: negative for hematuria Abdominal: negative for nausea, vomiting, diarrhea, bright red blood per rectum, melena, or hematemesis Neurologic: negative for visual changes, syncope, or dizziness All other systems reviewed and are otherwise negative except as noted above.    Blood pressure 112/60, pulse (!) 57, height $RemoveBe'5\' 8"'aOaLHgUSx$  (1.727 m), weight 278 lb (126.1 kg), SpO2 96 %.  General appearance: alert and no distress Neck: no adenopathy, no carotid bruit, no JVD, supple, symmetrical, trachea midline, and thyroid not enlarged, symmetric, no tenderness/mass/nodules Lungs: clear to auscultation bilaterally Heart: regular rate and rhythm, S1, S2 normal, no murmur, click, rub or gallop Extremities: extremities normal, atraumatic, no cyanosis or edema Pulses: 2+ and symmetric Skin: Skin color, texture, turgor normal. No rashes or lesions Neurologic: Grossly normal  EKG sinus bradycardia 57 without ST or T wave changes.  I personally reviewed this EKG.  ASSESSMENT AND PLAN:   OSA (obstructive sleep apnea) History of obstructive sleep apnea currently not  on CPAP.  She has seen Dr. Claiborne Billings in the past.  We will arrange a follow-up visit.  Hyperlipidemia History of hyperlipidemia on statin therapy with lipid profile performed 06/03/2021 revealing total cholesterol 121, LDL 54 and HDL 52.     Lorretta Harp MD [facility], [facility] 03/10/2022 11:34 AM

## 2022-03-10 NOTE — Patient Instructions (Signed)
Medication Instructions:  Your physician recommends that you continue on your current medications as directed. Please refer to the Current Medication list given to you today.  *If you need a refill on your cardiac medications before your next appointment, please call your pharmacy*   Follow-Up: At [facility], you and your health needs are our priority.  As part of our continuing mission to provide you with exceptional heart care, we have created designated Provider Care Teams.  These Care Teams include your primary Cardiologist (physician) and Advanced Practice Providers (APPs -  Physician Assistants and Nurse Practitioners) who all work together to provide you with the care you need, when you need it.  We recommend signing up for the patient portal called "MyChart".  Sign up information is provided on this After Visit Summary.  MyChart is used to connect with patients for Virtual Visits (Telemedicine).  Patients are able to view lab/test results, encounter notes, upcoming appointments, etc.  Non-urgent messages can be sent to your provider as well.   To learn more about what you can do with MyChart, go to [email or web address].    Your next appointment:   12 month(s)  The format for your next appointment:   In Person  Provider:   Quay Burow, MD   Other Instructions We will make you a follow up visit to see Dr. Claiborne Billings.

## 2022-03-10 NOTE — Progress Notes (Signed)
Patient c/o left ear ache. Patient said it feels like water or something is in her ear. Patient has no other concerns

## 2022-03-10 NOTE — Assessment & Plan Note (Signed)
History of obstructive sleep apnea currently not on CPAP.  She has seen Dr. Claiborne Billings in the past.  We will arrange a follow-up visit.

## 2022-03-10 NOTE — Progress Notes (Signed)
Chief Complaint:   OBESITY Robin [name] is here to discuss her progress with her obesity treatment plan along with follow-up of her obesity related diagnoses. Robin [name] is on the Category 3 Plan and states she is following her eating plan approximately 40% of the time. Robin [name] states she is doing chair exercise for 30 minutes 3-4times per week.  Today's visit was #: 12 Starting weight: 310 lbs Starting date: 06/03/2021 Today's weight: 276 lbs Today's date: 03/09/2022 Total lbs lost to date: 34 lbs Total lbs lost since last in-office visit: 3 lbs  Interim History: Robin [name] is down another 3 lbs and has done well overall.   Subjective:   1. Diabetes mellitus type 2 in obese ([place]) Robin [name]'s fasting blood sugar level was in the 90's. She notes no lows.   2. Other depression, with emotional eating binges Robin [name] is currently taking Topamax. She notes decreased stress eating.   Assessment/Plan:   1. Diabetes mellitus type 2 in obese ([facility]) Robin [name] agrees to increase Trulicity from 1.5 mg to 3.0 mg. We will refill Trulicity 3.0 mg for 1 month with no refills. Good blood sugar control is important to decrease the likelihood of diabetic complications such as nephropathy, neuropathy, limb loss, blindness, coronary artery disease, and death. Intensive lifestyle modification including diet, exercise and weight loss are the first line of treatment for diabetes.   - Dulaglutide (TRULICITY) 3 JA/2.5KN SOPN; Inject 3 mg as directed once a week.  Dispense: 2 mL; Refill: 0  2. Other depression, with emotional eating binges We will refill Topamax 25 mg for 1 month with no refills. Behavior modification techniques were discussed today to help Robin [name] deal with her emotional/non-hunger eating behaviors.  Orders and follow up as documented in patient record.   - topiramate (TOPAMAX) 25 MG tablet; Take 1 tablet (25 mg total) by mouth 2 (two) times daily.  Dispense: 60 tablet; Refill: 0  3. Obesity, Current  BMI 42.1 Robin [name] is currently in the action stage of change. As such, her goal is to continue with weight loss efforts. She has agreed to the Category 3 Plan.   Robin [name] will continue meal planning and she will continue intentional eating.   Exercise goals:  As is.   Behavioral modification strategies: increasing lean protein intake, decreasing simple carbohydrates, increasing vegetables, increasing water intake, decreasing eating out, no skipping meals, meal planning and cooking strategies, keeping healthy foods in the home, and planning for success.  Robin [name] has agreed to follow-up with our clinic in 4 weeks. She was informed of the importance of frequent follow-up visits to maximize her success with intensive lifestyle modifications for her multiple health conditions.   Objective:   Blood pressure 115/72, pulse (!) 59, temperature 97.9 F (36.6 C), height '5\' 8"'$  (1.727 m), weight 276 lb (125.2 kg), SpO2 97 %. Body mass index is 41.97 kg/m.  General: Cooperative, alert, well developed, in no acute distress. HEENT: Conjunctivae and lids unremarkable. Cardiovascular: Regular rhythm.  Lungs: Normal work of breathing. Neurologic: No focal deficits.   Lab Results  Component Value Date   CREATININE 0.90 11/17/2021   BUN 11 11/17/2021   NA 143 11/17/2021   K 4.4 11/17/2021   CL 103 11/17/2021   CO2 26 11/17/2021   Lab Results  Component Value Date   ALT 20 11/17/2021   AST 20 11/17/2021   ALKPHOS 70 11/17/2021   BILITOT 0.4 11/17/2021   Lab Results  Component Value Date   HGBA1C 5.3 03/10/2022   HGBA1C  5.7 (H) 11/17/2021   HGBA1C 5.7 11/10/2021   HGBA1C 6.1 (H) 06/03/2021   HGBA1C 6.7 12/02/2020   Lab Results  Component Value Date   INSULIN 28.2 (H) 11/17/2021   INSULIN 48.4 (H) 06/03/2021   Lab Results  Component Value Date   TSH 0.891 11/10/2021   Lab Results  Component Value Date   CHOL 121 06/03/2021   HDL 52 06/03/2021   LDLCALC 54 06/03/2021   TRIG 73  06/03/2021   CHOLHDL 2.3 06/03/2021   Lab Results  Component Value Date   VD25OH 61.7 06/03/2021   Lab Results  Component Value Date   WBC 12.0 (H) 01/04/2021   HGB 12.8 01/04/2021   HCT 41.0 01/04/2021   MCV 94.5 01/04/2021   PLT 411 (H) 01/04/2021   Lab Results  Component Value Date   IRON 31 03/04/2015   TIBC 340 03/04/2015   FERRITIN 34 03/04/2015   Attestation Statements:   Reviewed by clinician on day of visit: allergies, medications, problem list, medical history, surgical history, family history, social history, and previous encounter notes.  I, Lizbeth Bark, RMA, am acting as Location manager for [employer], DO.  I have reviewed the above documentation for accuracy and completeness, and I agree with the above. Jearld Lesch, DO

## 2022-03-10 NOTE — Assessment & Plan Note (Signed)
History of hyperlipidemia on statin therapy with lipid profile performed 06/03/2021 revealing total cholesterol 121, LDL 54 and HDL 52.

## 2022-03-11 ENCOUNTER — Other Ambulatory Visit (INDEPENDENT_AMBULATORY_CARE_PROVIDER_SITE_OTHER): Payer: Self-pay | Admitting: Family Medicine

## 2022-03-11 ENCOUNTER — Other Ambulatory Visit: Payer: Self-pay | Admitting: Internal Medicine

## 2022-03-11 DIAGNOSIS — E1169 Type 2 diabetes mellitus with other specified complication: Secondary | ICD-10-CM

## 2022-03-12 ENCOUNTER — Telehealth: Payer: Self-pay

## 2022-03-12 MED ORDER — NEOMYCIN-POLYMYXIN-HC 1 % OT SOLN: 3.0000 [drp] | Freq: Four times a day (QID) | OTIC | 0 refills | Status: AC

## 2022-03-12 NOTE — Telephone Encounter (Signed)
Cipro HC in non-preferred on patients ins.  Preferred alternatives are Neomycin-Polymxin-HC or Ofloxacin if appropriate.

## 2022-03-13 ENCOUNTER — Encounter: Payer: Self-pay | Admitting: Podiatry

## 2022-03-13 ENCOUNTER — Ambulatory Visit (INDEPENDENT_AMBULATORY_CARE_PROVIDER_SITE_OTHER): Payer: Medicaid Other | Admitting: Podiatry

## 2022-03-13 DIAGNOSIS — B351 Tinea unguium: Secondary | ICD-10-CM | POA: Diagnosis not present

## 2022-03-13 DIAGNOSIS — E1142 Type 2 diabetes mellitus with diabetic polyneuropathy: Secondary | ICD-10-CM

## 2022-03-13 DIAGNOSIS — E119 Type 2 diabetes mellitus without complications: Secondary | ICD-10-CM | POA: Diagnosis not present

## 2022-03-13 DIAGNOSIS — M79675 Pain in left toe(s): Secondary | ICD-10-CM

## 2022-03-13 DIAGNOSIS — M79674 Pain in right toe(s): Secondary | ICD-10-CM

## 2022-03-13 NOTE — Patient Instructions (Signed)
It was my pleasure serving you today! You had your annual diabetic foot examination performed today. You will notice a charge for an office visit on today's billing and this is for your annual diabetic foot exam. It is done once yearly.

## 2022-03-16 ENCOUNTER — Encounter (INDEPENDENT_AMBULATORY_CARE_PROVIDER_SITE_OTHER): Payer: Self-pay | Admitting: Bariatrics

## 2022-03-17 NOTE — Progress Notes (Signed)
ANNUAL DIABETIC FOOT EXAM  Subjective: Robin [name] presents today for annual diabetic foot examination.  Patient relates 4 year h/o diabetes.  Patient denies any h/o foot wounds.  Patient has been diagnosed with neuropathy and it is managed with gabapentin.  Robin [name] had bilateral great toe matrixectomies of both borders with Dr. Sherryle Lis. Robin [name] is pleased with results.  Patient's blood sugar was 91 mg/dl today. Last known  HgA1c was 5.2%.  Risk factors: diabetes, HTN, hyperlipidemia.  Her husband is present during today's visit.  Robin Pier, MD is patient's PCP. Last visit was Feb 17, 2022.  Past Medical History:  Diagnosis Date   Anxiety disorder    Arthritis    Asthma    Back pain    Chronic neck pain    Constipation    Depression    Diabetes mellitus without complication (HCC)    Dyspnea    Edema of lower extremity    Fibromyalgia    GERD (gastroesophageal reflux disease)    Heartburn    Hiatal hernia    small   Hyperlipidemia    Hypertension    Hypothyroidism    Joint pain    Morbid obesity with BMI of 50.0-59.9, adult (HCC)    Osteoarthritis    Other fatigue    Schatzki's ring    non critical   Sinusitis    Sleep apnea    CPAP, Sleep study at [place]   SOB (shortness of breath) on exertion    Swallowing difficulty    Patient Active Problem List   Diagnosis Date Noted   Body mass index 40.0-44.9, adult ([place]) 02/25/2022   Morbid obesity ([place]) 02/25/2022   Right knee pain 02/18/2022   Diabetes mellitus type 2 in obese ([place]) 02/18/2022   Lumbar foraminal stenosis 01/23/2022   Lumbar radiculopathy 01/23/2022   Pain in both knees 01/23/2022   Vitamin D deficiency 11/18/2021   Adjustment disorder with mixed disturbance of emotions and conduct 07/28/2020   Polyp of descending colon 02/15/2020   Pituitary tumor 08/21/2019   Primary osteoarthritis of both hips 08/21/2019   Primary osteoarthritis of both knees 08/21/2019   Pain in  left shoulder 08/21/2019   Morbid obesity with BMI of 60.0-69.9, adult ([place]) 07/17/2019   Pedal edema 07/17/2019   Multilevel degenerative disc disease 10/31/2018   Abdominal pain 10/28/2018   Change in bowel function 10/28/2018   Severe episode of recurrent major depressive disorder, with psychotic features ([place]) 05/26/2018   Gastritis and gastroduodenitis 04/26/2018   Atrophic vaginitis 04/01/2018   Diabetes mellitus ([place]) 03/31/2018   Cough, persistent 02/08/2018   Dysphagia 12/14/2017   Generalized OA 07/30/2017   Urinary incontinence 07/23/2017   Early satiety 06/16/2017   Hx of iron deficiency anemia 05/28/2017   Throat pain in adult 04/05/2017   Prediabetes 02/04/2017   Dyspnea on exertion 12/23/2016   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 12/23/2016   Hemoptysis 12/23/2016   Fibromyalgia 08/18/2016   Chronic lymphocytic thyroiditis 08/18/2016   Depression 04/01/2016   OSA (obstructive sleep apnea) 04/01/2016   Essential hypertension 12/09/2015   Hypothyroidism 12/09/2015   Morbid obesity due to excess calories ([place]) 12/09/2015   Constipation 05/27/2015   Fatty liver 05/27/2015   Abdominal pain, chronic, epigastric 07/04/2012   Anxiety 06/09/2012   History of gastroesophageal reflux (GERD) 06/09/2012   Hyperlipidemia 06/09/2012   Refusal of blood transfusions as patient is Jehovah's Witness 06/09/2012   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 Erlene Quan)   COLONOSCOPY  2006   Dr. Franky Macho, hyperplastic polyps   COLONOSCOPY  08/06/2011   abnormal terminal ileum for 10cm, erosions, geographical ulceration. Bx small bowel mucosa with prominent intramucosal lymphoid  aggregates, slightly inflammed   COLONOSCOPY WITH PROPOFOL N/A 12/28/2019   Rourk: 4 sessile serrated adenomas removed on the colon.  Next colonoscopy in 3 years.   DIRECT LARYNGOSCOPY  03/15/2012   Procedure: DIRECT LARYNGOSCOPY;  Surgeon: Flo Shanks, MD;  Location: [facility] OR;  Service: ENT;  Laterality: N/A; Dr. Wolicki--:>no foreign body seen. normal esophagus to 40cm   ESOPHAGEAL DILATION  04/17/2015   Procedure: ESOPHAGEAL DILATION;  Surgeon: Corbin Ade, MD;  Location: [facility] END[facility] SUITE;  Service: Endoscopy;;   ESOPHAGOGASTRODUODENOSCOPY  08/23/2008   RBB:YFYT distal esophageal erosions consistent with mild erosive reflux esophagitis, otherwise unremarkable esophagus/ Tiny antral erosions of doubtful clinical significance, otherwise normal stomach, patent pylorus, normal D1 and D2   ESOPHAGOGASTRODUODENOSCOPY  08/06/2011   small hh, noncritical Schatzki's ring s/p 58 F   ESOPHAGOGASTRODUODENOSCOPY N/A 04/17/2015   RTH:ZACIWS s/p dilation   ESOPHAGOGASTRODUODENOSCOPY (EGD) WITH PROPOFOL N/A 01/20/2018   Dr. Jena Gauss: Erosive gastropathy, normal-appearing esophagus status post empiric dilation.  Chronic gastritis, no H. pylori.   ESOPHAGOSCOPY  03/15/2012   Procedure: ESOPHAGOSCOPY;  Surgeon: Flo Shanks, MD;  Location: [facility] OR;  Service: ENT;  Laterality: N/A;   KNEE ARTHROSCOPY  04/27/2001   MALONEY DILATION N/A 01/20/2018   Procedure: Elease Hashimoto DILATION;  Surgeon: Corbin Ade, MD;  Location: [facility];  Service: Endoscopy;  Laterality: N/A;   NM MYOCAR PERF WALL MOTION  2003   negative bruce protocol exercise tress test; EF 68%; intermediate risk study due to evidence of anterior wall ischemia extending from mid-ventricle to apex   PITUITARY SURGERY  06/2012   benign tumor, surgeon at [facility]   POLYPECTOMY  12/28/2019   Procedure: POLYPECTOMY;  Surgeon: Corbin Ade, MD;  Location: [facility];  Service: Endoscopy;;   RECTOCELE REPAIR     TRANSTHORACIC ECHOCARDIOGRAM  2003    EF normal   VAGINAL PROLAPSE REPAIR     VIDEO BRONCHOSCOPY Bilateral 03/08/2017   Procedure: VIDEO BRONCHOSCOPY WITHOUT FLUORO;  Surgeon: Roslynn Amble, MD;  Location: [facility] ENDOSCOPY;  Service: Cardiopulmonary;  Laterality: Bilateral;   Current Outpatient Medications on File Prior to Visit  Medication Sig Dispense Refill   ACCU-CHEK AVIVA PLUS test strip USE AS DIRECTED 100 strip 3   acetaminophen (TYLENOL) 500 MG tablet Take 1,000 mg by mouth 2 (two) times daily.      aspirin EC 81 MG tablet Take 81 mg by mouth at bedtime.     atorvastatin (LIPITOR) 40 MG tablet TAKE ONE TABLET BY MOUTH EVERY EVENING 90 tablet 2   azelastine (ASTELIN) 0.1 % nasal spray 1-2 sprays per nostril 2 times daily as needed 30 mL 12   CALCIUM CARBONATE ANTACID PO Take 2 tablets by mouth See admin instructions. Take 2 tablets by mouth every morning, may also take 2 tablets at night as needed for acid reflux (Patient not taking: Reported on 03/10/2022)     celecoxib (CELEBREX) 100 MG capsule TAKE ONE CAPSULE BY MOUTH TWICE DAILY (Patient not taking: Reported on 03/10/2022) 180 capsule 1  CINNAMON PO Take 1 capsule by mouth daily.     Dulaglutide (TRULICITY) 3 HD/6.2IW SOPN Inject 3 mg as directed once a week. 2 mL 0   DULoxetine (CYMBALTA) 30 MG capsule Take 3 capsules (90 mg total) by mouth daily. (Patient not taking: Reported on 03/10/2022) 90 capsule 0   esomeprazole (NEXIUM) 40 MG capsule TAKE ONE CAPSULE BY MOUTH EVERY MORNING and TAKE ONE CAPSULE BY MOUTH EVERY EVENING 60 capsule 0   fluticasone (FLONASE) 50 MCG/ACT nasal spray Place 1 spray into both nostrils daily. 16 g 1   furosemide (LASIX) 40 MG tablet TAKE 1/2 TO 1 TABLET BY MOUTH DAILY FOR LOWER EXTREMITY SWELLING 90 tablet 2   gabapentin (NEURONTIN) 300 MG capsule TAKE ONE CAPSULE BY MOUTH EVERY EVENING (with $RemoveBefor'600mg'BULQbzZVayMv$  tab FOR $Remov'900mg'ZkSvkv$  DOSE) 30 capsule 6   gabapentin (NEURONTIN) 600 MG tablet 1 tab po Q a.m and afternoon and 1 1/2 tab PO Q p.m 315 tablet 2    gabapentin (NEURONTIN) 600 MG tablet Take by mouth.     glucose blood test strip USE AS INSTRUCTED     glucose blood test strip USE AS INSTRUCTED     hydrALAZINE (APRESOLINE) 50 MG tablet TAKE ONE TABLET BY MOUTH EVERY MORNING and TAKE ONE TABLET BY MOUTH EVERY EVENING 180 tablet 0   hydrochlorothiazide (HYDRODIURIL) 25 MG tablet TAKE ONE TABLET BY MOUTH EVERY MORNING 90 tablet 1   levothyroxine (SYNTHROID) 88 MCG tablet TAKE ONE TABLET BY MOUTH BEFORE BREAKFAST 90 tablet 3   linaclotide (LINZESS) 290 MCG CAPS capsule TAKE 1 CAPSULE BY MOUTH DAILY BEFORE BREAKFAST. 90 capsule 3   losartan (COZAAR) 25 MG tablet TAKE ONE TABLET BY MOUTH EVERY EVENING 90 tablet 0   metFORMIN (GLUCOPHAGE) 500 MG tablet TAKE TWO TABLETS BY MOUTH EVERY MORNING and TAKE ONE TABLET EVERY EVENING 270 tablet 2   Metoprolol Tartrate 75 MG TABS TAKE ONE TABLET BY MOUTH EVERY MORNING and TAKE ONE TABLET BY MOUTH EVERY EVENING 180 tablet 2   montelukast (SINGULAIR) 10 MG tablet TAKE ONE TABLET BY MOUTH EVERY EVENING 90 tablet 2   Multiple Vitamin (MULTIVITAMIN WITH MINERALS) TABS tablet Take 1 tablet by mouth daily.     NARCAN 4 MG/0.1ML LIQD nasal spray kit      NEOMYCIN-POLYMYXIN-HYDROCORTISONE (CORTISPORIN) 1 % SOLN OTIC solution Place 3 drops into the left ear 4 (four) times daily. 10 mL 0   Omega-3 Fatty Acids (FISH OIL) 1000 MG CAPS Take 1,000 mg by mouth at bedtime.     Sodium Chloride-Sodium Bicarb (NETI POT SINUS WASH) 2300-700 MG KIT DO nasal rinse twice a week as needed 1 kit 0   spironolactone (ALDACTONE) 25 MG tablet Take 1 tablet (25 mg total) by mouth every morning. 90 tablet 2   tiZANidine (ZANAFLEX) 2 MG tablet Take by mouth.     topiramate (TOPAMAX) 25 MG tablet Take 1 tablet (25 mg total) by mouth 2 (two) times daily. 60 tablet 0   traMADol (ULTRAM) 50 MG tablet TAKE ONE TABLET BY MOUTH twice daily AS NEEDED 60 tablet 2   traMADol (ULTRAM) 50 MG tablet Take 1-2 tablets (50-100 mg total) by mouth daily as  needed. 20 tablet 0   VENTOLIN HFA 108 (90 Base) MCG/ACT inhaler INHALE 1-2 PUFFS into THE lungs every SIX hours AS NEEDED FOR WHEEZING OR FOR SHORTNESS OF BREATH 8.5 g 6   vitamin B-12 (CYANOCOBALAMIN) 1000 MCG tablet Take 1,000 mcg by mouth daily.     Vitamin D, Ergocalciferol, (  DRISDOL) 1.25 MG (50000 UNIT) CAPS capsule Take 1 capsule (50,000 Units total) by mouth once a week. 12 capsule 0   [DISCONTINUED] amitriptyline (ELAVIL) 50 MG tablet TAKE 1 TABLET BY MOUTH AT BEDTIME. 90 tablet 0   [DISCONTINUED] buPROPion (WELLBUTRIN XL) 300 MG 24 hr tablet TAKE 1 TABLET (300 MG TOTAL) BY MOUTH EVERY MORNING. (Patient taking differently: Take 300 mg by mouth daily.) 90 tablet 2   No current facility-administered medications on file prior to visit.    Allergies  Allergen Reactions   Amlodipine Besylate Other (See Comments)    Edema in lower extremities Other reaction(s): Other Edema in lower extremities   Celexa [Citalopram Hydrobromide] Other (See Comments)    "Made me feel out of it"   Gabapentin Other (See Comments)    Contributed to lower extremity edema   Diflucan [Fluconazole] Rash   Social History   Occupational History    Employer: UNEMPLOYED  Tobacco Use   Smoking status: Former    Packs/day: 0.50    Years: 10.00    Total pack years: 5.00    Types: Cigarettes    Start date: 07/14/1975    Quit date: 04/04/1993    Years since quitting: 28.9   Smokeless tobacco: Never   Tobacco comments:    quit 78 yrs ago  Vaping Use   Vaping Use: Never used  Substance and Sexual Activity   Alcohol use: No    Alcohol/week: 0.0 standard drinks of alcohol   Drug use: No   Sexual activity: Yes   Family History  Problem Relation Age of Onset   Diabetes Mother    Hypertension Mother    Heart Problems Mother    Hyperlipidemia Mother    Asthma Mother    Sleep apnea Mother    Obesity Mother    Diabetes Father    Kidney cancer Father    Hypertension Father    Hyperlipidemia Father     Sleep apnea Father    Hypertension Sister    Allergic rhinitis Sister    Hypertension Sister    Hyperlipidemia Sister    Liver disease Brother    Arthritis Brother    Hypertension Brother    Hyperlipidemia Brother    Hypertension Brother    Breast cancer Maternal Grandmother    Kidney disease Maternal Grandfather    Breast cancer Paternal Grandmother    Eczema Daughter    Lupus Maternal Aunt    Stomach cancer Other        aunt   Colon cancer Neg Hx    Immunodeficiency Neg Hx    Urticaria Neg Hx    Immunization History  Administered Date(s) Administered   Influenza Inj Mdck Quad With Preservative 06/05/2018   Influenza,inj,Quad PF,6+ Mos 08/05/2016, 07/30/2017, 06/09/2018, 07/28/2019, 07/10/2021   Influenza-Unspecified 07/25/2013, 09/04/2014, 07/05/2020   PFIZER(Purple Top)SARS-COV-2 Vaccination 01/11/2020, 02/01/2020   Pneumococcal Polysaccharide-23 08/05/2016   Tdap 08/05/2016   Zoster Recombinat (Shingrix) 02/27/2021, 07/25/2021     Review of Systems: Negative except as noted in the HPI.   Objective: There were no vitals filed for this visit.  Robin [name] is a pleasant 66 y.o. female in NAD. AAO X 3.  Vascular Examination: Capillary refill time to digits immediate b/l. Palpable DP pulse(s) b/l LE. Palpable PT pulse(s) b/l LE. Pedal hair sparse. No pain with calf compression b/l. Lower extremity skin temperature gradient within normal limits. Trace edema noted BLE. No cyanosis or clubbing noted b/l LE.  Dermatological Examination: Pedal skin is warm  and supple b/l LE. No open wounds b/l LE. No interdigital macerations noted b/l LE. Toenails 1-5 b/l well maintained with adequate length. No erythema, no edema, no drainage, no fluctuance. Procedure site of both borders of left hallux and both borders of right hallux noted to be completely healed with no erythema, no edema, no drainage, no purulence.   Neurological Examination: Pt has subjective symptoms of neuropathy.  Protective sensation intact 5/5 intact bilaterally with 10g monofilament b/l. Vibratory sensation intact b/l.  Musculoskeletal Examination: Muscle strength 5/5 to all lower extremity muscle groups bilaterally. No pain, crepitus or joint limitation noted with ROM bilateral LE. Utilizes motorized chair for mobility assistance.  Footwear Assessment: Does the patient wear appropriate shoes? Yes. Does the patient need inserts/orthotics? No.     Latest Ref Rng & Units 03/10/2022    9:11 AM 11/17/2021    9:41 AM 11/10/2021   11:23 AM 06/03/2021    8:33 AM  Hemoglobin A1C  Hemoglobin-A1c 4.0 - 5.6 % 5.3  5.7  5.7  6.1    Assessment: 1. Pain due to onychomycosis of toenails of both feet   2. Diabetic peripheral neuropathy associated with type 2 diabetes mellitus ([place])   3. Encounter for diabetic foot exam ([place])     ADA Risk Categorization: Low Risk :  Patient has all of the following: Intact protective sensation No prior foot ulcer  No severe deformity Pedal pulses present  Plan: -Patient was evaluated and treated. All patient's and/or POA's questions/concerns answered on today's visit. -Robin [name] had bilateral great toe matriexectomies of both great toes with Dr. Sherryle Lis. -Diabetic foot examination performed today. -Continue foot and shoe inspections daily. Monitor blood glucose per PCP/Endocrinologist's recommendations. -Discussed topical, laser and oral medication. Patient opted for topical treatment. Rx sent to pharmacy for Jublia (Efinconazole) 10% Solution.  Apply one coat to each toenail once daily for 48 weeks. -Mycotic toenails 1-5 bilaterally were debrided in length and girth with sterile nail nippers and dremel without incident. -Patient/POA to call should there be question/concern in the interim. Return in about 3 months (around 06/13/2022).  Marzetta Board, DPM

## 2022-03-19 ENCOUNTER — Other Ambulatory Visit: Payer: Self-pay | Admitting: Gastroenterology

## 2022-03-23 ENCOUNTER — Other Ambulatory Visit: Payer: Self-pay | Admitting: Orthopaedic Surgery

## 2022-03-26 ENCOUNTER — Other Ambulatory Visit: Payer: Self-pay | Admitting: Physician Assistant

## 2022-03-26 ENCOUNTER — Telehealth: Payer: Self-pay | Admitting: Orthopaedic Surgery

## 2022-03-26 MED ORDER — TRAMADOL HCL 50 MG PO TABS: 50.0000 mg | ORAL_TABLET | Freq: Two times a day (BID) | ORAL | 2 refills | Status: DC | PRN

## 2022-03-26 NOTE — Telephone Encounter (Signed)
Pt called requesting refill of tramadol. Please send to pharmacy on file. Pt phone number is [phone]

## 2022-03-26 NOTE — Telephone Encounter (Signed)
Sent in

## 2022-03-27 ENCOUNTER — Ambulatory Visit: Payer: Medicaid Other | Admitting: Gastroenterology

## 2022-03-27 NOTE — Progress Notes (Deleted)
   Dr Rourke's office Aug 2021 HPI: Robin [name] is a 66 y.o. female here for follow-up.  Last seen at time of colonoscopy March 2021 for change in bowel habits.  She had 4 sessile serrated polyps removed from the colon.  Plans for follow-up colonoscopy in 3 years.  Also with history of GERD, constipation, fatty liver.  Last EGD April 2019 showed erosive gastropathy, chronic gastritis on biopsy but no H. pylori.  Normal-appearing esophagus was dilated.  Previously failed omeprazole twice daily, ranitidine.  Pantoprazole and AcipHex twice daily initially helped but lost efficacy.    Feels like Dexilant not working anymore. Takes in the mornings. Also chews TUMS throughout the day. Complains of epigastric burning and heartburn. Doesn't matter what she eats. Happens with bowl of cereal. No dysphagia. No vomiting. On celebrex bid. BM ok with Linzess. No melena, brbpr.

## 2022-04-06 ENCOUNTER — Encounter (INDEPENDENT_AMBULATORY_CARE_PROVIDER_SITE_OTHER): Payer: Self-pay | Admitting: Bariatrics

## 2022-04-06 ENCOUNTER — Ambulatory Visit (INDEPENDENT_AMBULATORY_CARE_PROVIDER_SITE_OTHER): Payer: Medicaid Other | Admitting: Bariatrics

## 2022-04-06 VITALS — BP 145/83 | HR 64 | Temp 97.8°F | Ht 68.0 in | Wt 280.0 lb

## 2022-04-06 DIAGNOSIS — F3289 Other specified depressive episodes: Secondary | ICD-10-CM

## 2022-04-06 DIAGNOSIS — E1169 Type 2 diabetes mellitus with other specified complication: Secondary | ICD-10-CM | POA: Diagnosis not present

## 2022-04-06 DIAGNOSIS — Z7985 Long-term (current) use of injectable non-insulin antidiabetic drugs: Secondary | ICD-10-CM

## 2022-04-06 DIAGNOSIS — Z6841 Body Mass Index (BMI) 40.0 and over, adult: Secondary | ICD-10-CM

## 2022-04-06 DIAGNOSIS — E669 Obesity, unspecified: Secondary | ICD-10-CM

## 2022-04-06 MED ORDER — TOPIRAMATE 25 MG PO TABS: 25.0000 mg | ORAL_TABLET | Freq: Two times a day (BID) | ORAL | 0 refills | Status: DC

## 2022-04-06 MED ORDER — TRULICITY 3 MG/0.5ML ~~LOC~~ SOAJ: 3.0000 mg | SUBCUTANEOUS | 0 refills | Status: DC

## 2022-04-07 NOTE — Progress Notes (Unsigned)
Chief Complaint:   OBESITY Robin [name] is here to discuss her progress with her obesity treatment plan along with follow-up of her obesity related diagnoses. Robin [name] is on the Category 3 Plan and states she is following her eating plan approximately 40% of the time. Robin [name] states she is doing chair exercise for 30 minutes 3 times per week.  Today's visit was #: 13 Starting weight: 310 lbs Starting date: 06/03/2021 Today's weight: 280 lbs Today's date: 04/06/2022 Total lbs lost to date: 30 Total lbs lost since last in-office visit: 0  Interim History: Robin [name] is up 4 pounds since her last visit, but she has done well overall.  She was more depressed and had more stress eating.  Subjective:   1. Diabetes mellitus type 2 in obese ([place]) Robin [name]'s fasting blood sugars range between 90-100's.  She is taking Trulicity as directed.  She denies low blood sugars or increased polyphagia.  2. Other depression, with emotional eating binges Robin [name] notes being depressed and has been doing more stress eating.  Assessment/Plan:   1. Diabetes mellitus type 2 in obese (HCC) We will refill Trulicity 3 mg once weekly for 1 month.  Robin [name] will decrease her portion size and increase her water intake.  Snack sheet was given.  - Dulaglutide (TRULICITY) 3 WR/6.0AV SOPN; Inject 3 mg as directed once a week.  Dispense: 2 mL; Refill: 0  2. Other depression, with emotional eating binges We will refill Topamax 25 mg twice daily for 1 month.  - topiramate (TOPAMAX) 25 MG tablet; Take 1 tablet (25 mg total) by mouth 2 (two) times daily.  Dispense: 60 tablet; Refill: 0  3. Obesity, Current BMI 42.6 Robin [name] is currently in the action stage of change. As such, her goal is to continue with weight loss efforts. She has agreed to the Category 3 Plan.   Robin [name] will adhere closely to the plan 80 to 90%.  Exercise goals: As is.   Behavioral modification strategies: increasing lean protein intake, decreasing  simple carbohydrates, increasing vegetables, increasing water intake, decreasing eating out, no skipping meals, meal planning and cooking strategies, keeping healthy foods in the home, and planning for success.  Robin [name] has agreed to follow-up with our clinic in 4 weeks. She was informed of the importance of frequent follow-up visits to maximize her success with intensive lifestyle modifications for her multiple health conditions.   Objective:   Blood pressure (!) 145/83, pulse 64, temperature 97.8 F (36.6 C), height '5\' 8"'$  (1.727 m), weight 280 lb (127 kg), SpO2 97 %. Body mass index is 42.57 kg/m.  General: Cooperative, alert, well developed, in no acute distress. HEENT: Conjunctivae and lids unremarkable. Cardiovascular: Regular rhythm.  Lungs: Normal work of breathing. Neurologic: No focal deficits.   Lab Results  Component Value Date   CREATININE 0.90 11/17/2021   BUN 11 11/17/2021   NA 143 11/17/2021   K 4.4 11/17/2021   CL 103 11/17/2021   CO2 26 11/17/2021   Lab Results  Component Value Date   ALT 20 11/17/2021   AST 20 11/17/2021   ALKPHOS 70 11/17/2021   BILITOT 0.4 11/17/2021   Lab Results  Component Value Date   HGBA1C 5.3 03/10/2022   HGBA1C 5.7 (H) 11/17/2021   HGBA1C 5.7 11/10/2021   HGBA1C 6.1 (H) 06/03/2021   HGBA1C 6.7 12/02/2020   Lab Results  Component Value Date   INSULIN 28.2 (H) 11/17/2021   INSULIN 48.4 (H) 06/03/2021   Lab Results  Component Value Date  TSH 0.891 11/10/2021   Lab Results  Component Value Date   CHOL 121 06/03/2021   HDL 52 06/03/2021   LDLCALC 54 06/03/2021   TRIG 73 06/03/2021   CHOLHDL 2.3 06/03/2021   Lab Results  Component Value Date   VD25OH 61.7 06/03/2021   Lab Results  Component Value Date   WBC 12.0 (H) 01/04/2021   HGB 12.8 01/04/2021   HCT 41.0 01/04/2021   MCV 94.5 01/04/2021   PLT 411 (H) 01/04/2021   Lab Results  Component Value Date   IRON 31 03/04/2015   TIBC 340 03/04/2015   FERRITIN  34 03/04/2015   Attestation Statements:   Reviewed by clinician on day of visit: allergies, medications, problem list, medical history, surgical history, family history, social history, and previous encounter notes.   Wilhemena Durie, am acting as Location manager for [employer], DO.  I have reviewed the above documentation for accuracy and completeness, and I agree with the above. Jearld Lesch, DO

## 2022-04-09 ENCOUNTER — Encounter (INDEPENDENT_AMBULATORY_CARE_PROVIDER_SITE_OTHER): Payer: Self-pay | Admitting: Bariatrics

## 2022-04-09 ENCOUNTER — Ambulatory Visit (HOSPITAL_COMMUNITY): Payer: Medicaid Other | Admitting: Clinical

## 2022-04-14 ENCOUNTER — Other Ambulatory Visit (HOSPITAL_COMMUNITY): Payer: Self-pay | Admitting: Internal Medicine

## 2022-04-14 ENCOUNTER — Other Ambulatory Visit: Payer: Self-pay | Admitting: Gastroenterology

## 2022-04-15 NOTE — Telephone Encounter (Signed)
Pt is no longer a pt with [name], she is a pt with Robin [name]. Pt was instructed to contact [facility] to let them know to send future refill requests to them.

## 2022-04-15 NOTE — Telephone Encounter (Signed)
It is not clear if patient intends to transfer her care but she has had numerous cancelled appts with [facility] this year.   I will refill rx X 1 for continuity of care but she will need to either establish with [facility] and get future RX from them or her PCP OR OV here since we have not seen her since 05/2020.

## 2022-04-15 NOTE — Telephone Encounter (Signed)
Requested medication (s) are due for refill today:yes  Requested medication (s) are on the active medication list:yes  Last refill:  02/05/22  Future visit scheduled: yes  Notes to clinic:  Unable to refill per protocol, last refill by another provider.      Requested Prescriptions  Pending Prescriptions Disp Refills   DULoxetine (CYMBALTA) 30 MG capsule [Pharmacy Med Name: duloxetine 30 mg capsule,delayed release] 90 capsule 0    Sig: TAKE THREE CAPSULES BY MOUTH ONCE DAILY     There is no refill protocol information for this order

## 2022-04-29 ENCOUNTER — Other Ambulatory Visit: Payer: Self-pay | Admitting: Internal Medicine

## 2022-05-04 ENCOUNTER — Ambulatory Visit (INDEPENDENT_AMBULATORY_CARE_PROVIDER_SITE_OTHER): Payer: Medicaid Other | Admitting: Bariatrics

## 2022-05-04 ENCOUNTER — Ambulatory Visit (INDEPENDENT_AMBULATORY_CARE_PROVIDER_SITE_OTHER): Payer: Medicaid Other | Admitting: Clinical

## 2022-05-04 ENCOUNTER — Encounter (INDEPENDENT_AMBULATORY_CARE_PROVIDER_SITE_OTHER): Payer: Self-pay | Admitting: Bariatrics

## 2022-05-04 VITALS — BP 122/70 | HR 55 | Temp 98.0°F | Ht 68.0 in | Wt 274.0 lb

## 2022-05-04 DIAGNOSIS — Z6841 Body Mass Index (BMI) 40.0 and over, adult: Secondary | ICD-10-CM | POA: Diagnosis not present

## 2022-05-04 DIAGNOSIS — F341 Dysthymic disorder: Secondary | ICD-10-CM

## 2022-05-04 DIAGNOSIS — E669 Obesity, unspecified: Secondary | ICD-10-CM

## 2022-05-04 DIAGNOSIS — Z7984 Long term (current) use of oral hypoglycemic drugs: Secondary | ICD-10-CM

## 2022-05-04 DIAGNOSIS — E1169 Type 2 diabetes mellitus with other specified complication: Secondary | ICD-10-CM | POA: Diagnosis not present

## 2022-05-04 DIAGNOSIS — F3289 Other specified depressive episodes: Secondary | ICD-10-CM

## 2022-05-04 DIAGNOSIS — Z7985 Long-term (current) use of injectable non-insulin antidiabetic drugs: Secondary | ICD-10-CM

## 2022-05-04 MED ORDER — TOPIRAMATE 25 MG PO TABS: 25.0000 mg | ORAL_TABLET | Freq: Two times a day (BID) | ORAL | 0 refills | Status: DC

## 2022-05-04 MED ORDER — TRULICITY 3 MG/0.5ML ~~LOC~~ SOAJ: 3.0000 mg | SUBCUTANEOUS | 0 refills | Status: DC

## 2022-05-04 NOTE — Progress Notes (Signed)
   THERAPIST PROGRESS NOTE  Session Time: 45 minutes  Participation Level: Active  Behavioral Response: CasualAlertDepressed  Type of Therapy: Individual Therapy  Treatment Goals addressed: client will identify 3 cognitive patterns and beliefs that support depression  ProgressTowards Goals: Progressing  Interventions: CBT and Supportive  Summary:  KAYSEE HERGERT is a 66 y.o. female who presents for the scheduled session oriented x5, appropriately dressed, and friendly.  Client denied hallucinations or delusions. Client reported on today she has been doing about the same. Client reported since her last appointment her kids came down to visit her and her husband in June for a week.  Client reported she really enjoyed having them altogether.  Client reported prior to their kids coming down her husband was being very negative and complaining about allegedly not having enough money for him and her to go out to eat and do all the activities that their kids want to do.  Client reported during the visit her kids could notice that something is off between him and her and they are now coming to understand that their dad is not a good person to her. Client reported she figures that her kids are talking months themselves and processing how to handle the news.  Client reported her children are concerned.  Client reported her husband's health is not getting better but he refuses to discuss with her about his prognosis with his cancer.  Client reported she does not understand why he treats her so bad. Client reported he yells and uses profanity at her still. Client reported she asks herself "why am I hurting myself I didn't do anything", regarding taking on his feelings of anger and feeling guilt of wondering what she may have done. Client reported overall she knows she has been a good person and good to him. Client reported if she could move past this situation she would be enjoying freedom and doing activities  she enjoys gardening and enjoying her grandkids.  Evidence of progress towards goal:  client reported positive coping skills of positive reframing of her thoughts. Client reported 1 negative beliefs about herself that supports depression.   Suicidal/Homicidal: Nowithout intent/plan  Therapist Response:  Therapist began the appointment asking the client how she has been doing. Therapist used CBT to engage using active listening and positive emotional support. Therapist used CBT to engage and ask the client to describe her depressive thoughts and feelings. Therapist used CBT to empathize with the client and collaborate to process stages of acceptance. Therapist used CBT ask the client to identify her progress with frequency of use with coping skills with continued practice in her daily activity.    Therapist assigned the client homework to practice self care.    Plan: Return again in 5 weeks.  Diagnosis: persistent depressive disorder  Collaboration of Care: Patient refused AEB none requested.  Patient/Guardian was advised Release of Information must be obtained prior to any record release in order to collaborate their care with an outside provider. Patient/Guardian was advised if they have not already done so to contact the registration department to sign all necessary forms in order for [name] to release information regarding their care.   Consent: Patient/Guardian gives verbal consent for treatment and assignment of benefits for services provided during this visit. Patient/Guardian expressed understanding and agreed to proceed.   [place], LCSW 05/04/2022

## 2022-05-04 NOTE — Plan of Care (Signed)
  Problem: Depression CCP Problem  1  Goal: LTG: Robin [name] WILL SCORE LESS THAN 10 ON THE PATIENT HEALTH QUESTIONNAIRE (PHQ-9) Outcome: Progressing Goal: STG: Robin [name] WILL PARTICIPATE IN AT LEAST 80% OF SCHEDULED INDIVIDUAL PSYCHOTHERAPY SESSIONS Outcome: Progressing Goal: STG: Robin [name] WILL IDENTIFY 3 COGNITIVE PATTERNS AND BELIEFS THAT SUPPORT DEPRESSION Outcome: Progressing   

## 2022-05-05 ENCOUNTER — Other Ambulatory Visit: Payer: Self-pay | Admitting: Physician Assistant

## 2022-05-07 ENCOUNTER — Ambulatory Visit (INDEPENDENT_AMBULATORY_CARE_PROVIDER_SITE_OTHER): Payer: Medicaid Other | Admitting: Gastroenterology

## 2022-05-07 ENCOUNTER — Encounter: Payer: Self-pay | Admitting: Gastroenterology

## 2022-05-07 VITALS — BP 108/58 | HR 64 | Ht 65.0 in | Wt 275.0 lb

## 2022-05-07 DIAGNOSIS — K581 Irritable bowel syndrome with constipation: Secondary | ICD-10-CM

## 2022-05-07 DIAGNOSIS — Z860100 Personal history of colon polyps, unspecified: Secondary | ICD-10-CM

## 2022-05-07 MED ORDER — LINACLOTIDE 290 MCG PO CAPS: ORAL_CAPSULE | ORAL | 3 refills | Status: DC

## 2022-05-07 NOTE — Progress Notes (Signed)
HPI : Robin [name] is a very pleasant 66 year old female with morbid obesity, DM, anxiety/depression, GERD and IBS who is referred to [name] by Dr. Karle Plumber.  She was previously followed by Dr. Sydell Axon, but wished to relocate her GI care to [facility], closer to where she lives.  Ms. Luzader reports her main GI complaint today is left-sided abdominal fullness and discomfort.  This is a long standing problem for her.  She has taken Linzess for many years for this and reports that she does have a bowel movement most days on this medication.  She wanted to make sure it was safe to continue taking Linzess long term.  Although she has a bowel movement most days, she frequently has straining and hard stools.  She used to also take Miralax in addition to the [place] and this helped, but she stopped taking it because she couldn't afford to take it every day.  She cites her chronic joint pains/arthritis as a significant barrier to her mobility, and states that she is able to walk minimal distances due to pain.  Her last colonoscopy was in March 2021 and 3 tubular adenomas were removed.  She was recommended to repeat colonoscopy in 3 years. She has undergone numerous EGDs with empiric dilations in the past, most recently in 2019.  She denies problems with dysphagia currently.    Past Medical History:  Diagnosis Date   Anemia    Anxiety disorder    Arthritis    Asthma    Back pain    Chronic neck pain    Colon polyps    Constipation    Depression    Diabetes mellitus without complication (HCC)    Dyspnea    Edema of lower extremity    Fibromyalgia    GERD (gastroesophageal reflux disease)    Heartburn    Hiatal hernia    small   Hyperlipidemia    Hypertension    Hypothyroidism    Joint pain    Morbid obesity with BMI of 50.0-59.9, adult (HCC)    Osteoarthritis    Other fatigue    Schatzki's ring    non critical   Sinusitis    Sleep apnea    CPAP, Sleep study at [place]   SOB (shortness of breath) on exertion    Status post dilation of esophageal narrowing    Swallowing difficulty      Past Surgical History:  Procedure Laterality Date   ABDOMINAL HYSTERECTOMY  02/18/2000   CARDIAC CATHETERIZATION  08/18/2003   normal L main/LAD/L Cfx/RCA (Dr. Adora Fridge)   COLONOSCOPY  2006   Dr. [name], hyperplastic polyps   COLONOSCOPY  08/06/2011   abnormal terminal ileum for 10cm, erosions, geographical ulceration. Bx small bowel mucosa with prominent intramucosal lymphoid aggregates, slightly inflammed   COLONOSCOPY WITH PROPOFOL N/A 12/28/2019   Rourk: 4 sessile serrated adenomas removed on the colon.  Next colonoscopy in 3 years.   DIRECT LARYNGOSCOPY  03/15/2012   Procedure: DIRECT LARYNGOSCOPY;  Surgeon: [name], MD;  Location: [place];  Service: ENT;  Laterality: N/A; Dr. Wolicki--:>no foreign body seen. normal esophagus to 40cm   ESOPHAGEAL DILATION  04/17/2015   Procedure: ESOPHAGEAL DILATION;  Surgeon: [name], MD;  [facility];  Service: Endoscopy;;   ESOPHAGOGASTRODUODENOSCOPY  08/23/2008   TWS:FKCL distal esophageal erosions consistent with mild erosive reflux esophagitis, otherwise unremarkable esophagus/ Tiny antral erosions of doubtful clinical significance, otherwise  normal stomach, patent pylorus, normal D1 and D2   ESOPHAGOGASTRODUODENOSCOPY  08/06/2011   small hh, noncritical Schatzki's ring s/p 76 F   ESOPHAGOGASTRODUODENOSCOPY N/A 04/17/2015   FFM:[id number] s/p dilation   ESOPHAGOGASTRODUODENOSCOPY (EGD) WITH PROPOFOL N/A 01/20/2018   Dr. [name]: Erosive gastropathy, normal-appearing esophagus status post empiric dilation.  Chronic gastritis, no H. pylori.   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 Sydell Axon, 4 polyps all <1cm, sessile serrated adenomas, recommended repeat in 3 years EGD 2019, Dr. Sydell Axon, chronic active gastritis, no H pylori, empiric dilation with 56Fr Venia Minks, no change Family History  Problem Relation Age of Onset   Diabetes Mother    Hypertension Mother    Heart Problems Mother    Hyperlipidemia Mother    Asthma Mother    Sleep apnea Mother    Obesity Mother    Diabetes Father    Kidney cancer Father    Hypertension Father    Hyperlipidemia Father    Sleep apnea Father    Hypertension Sister    Allergic rhinitis Sister    Hypertension Sister    Hyperlipidemia Sister    Liver disease Brother    Arthritis Brother    Hypertension Brother    Hyperlipidemia Brother    Hypertension Brother    Breast cancer Maternal Grandmother    Kidney disease Maternal Grandfather    Breast cancer Paternal Grandmother    Eczema Daughter    Lupus Maternal Aunt    Stomach cancer Other        aunt   Colon cancer Neg Hx    Immunodeficiency Neg Hx    Urticaria Neg Hx    Social History   Tobacco Use   Smoking status: Former    Packs/day: 0.50    Years: 10.00    Total pack years: 5.00    Types:  Cigarettes    Start date: 07/14/1975    Quit date: 04/04/1993    Years since quitting: 29.1   Smokeless tobacco: Never   Tobacco comments:    quit 20 yrs ago  Vaping Use   Vaping Use: Never used  Substance Use Topics   Alcohol use: No    Alcohol/week: 0.0 standard drinks of alcohol   Drug use: No   Current Outpatient Medications  Medication Sig Dispense Refill   ACCU-CHEK AVIVA PLUS test strip USE AS DIRECTED 100 strip 3   acetaminophen (TYLENOL) 500 MG tablet Take 1,000 mg by mouth 2 (two) times daily.  albuterol (VENTOLIN HFA) 108 (90 Base) MCG/ACT inhaler INHALE 1-2 PUFFS BY MOUTH into THE lungs EVERY 6 HOURS AS NEEDED FOR SHORTNESS OF BREATH OR wheezing 18 g 2   aspirin EC 81 MG tablet Take 81 mg by mouth at bedtime.     atorvastatin (LIPITOR) 40 MG tablet TAKE ONE TABLET BY MOUTH EVERY EVENING 90 tablet 2   azelastine (ASTELIN) 0.1 % nasal spray 1-2 sprays per nostril 2 times daily as needed 30 mL 12   CALCIUM CARBONATE ANTACID PO Take 2 tablets by mouth See admin instructions. Take 2 tablets by mouth every morning, may also take 2 tablets at night as needed for acid reflux     celecoxib (CELEBREX) 100 MG capsule TAKE ONE CAPSULE BY MOUTH TWICE DAILY 180 capsule 1   CINNAMON PO Take 1 capsule by mouth daily.     Dulaglutide (TRULICITY) 3 CN/4.7SJ SOPN Inject 3 mg as directed once a week. 2 mL 0   DULoxetine (CYMBALTA) 30 MG capsule Take 3 capsules (90 mg total) by mouth daily. 90 capsule 0   esomeprazole (NEXIUM) 40 MG capsule TAKE ONE CAPSULE BY MOUTH EVERY MORNING and TAKE ONE CAPSULE BY MOUTH EVERY EVENING 60 capsule 0   fluticasone (FLONASE) 50 MCG/ACT nasal spray Place 1 spray into both nostrils daily. 16 g 1   furosemide (LASIX) 40 MG tablet TAKE 1/2 TO 1 TABLET BY MOUTH DAILY FOR LOWER EXTREMITY SWELLING 90 tablet 2   gabapentin (NEURONTIN) 300 MG capsule TAKE ONE CAPSULE BY MOUTH EVERY EVENING (with 634m tab FOR 9036mDOSE) 30 capsule 6   gabapentin (NEURONTIN) 600 MG  tablet 1 tab po Q a.m and afternoon and 1 1/2 tab PO Q p.m 315 tablet 2   glucose blood test strip USE AS INSTRUCTED     glucose blood test strip USE AS INSTRUCTED     hydrALAZINE (APRESOLINE) 50 MG tablet TAKE ONE TABLET BY MOUTH EVERY MORNING and TAKE ONE TABLET BY MOUTH EVERY EVENING 180 tablet 0   hydrochlorothiazide (HYDRODIURIL) 25 MG tablet TAKE ONE TABLET BY MOUTH EVERY MORNING 90 tablet 1   levothyroxine (SYNTHROID) 88 MCG tablet TAKE ONE TABLET BY MOUTH BEFORE BREAKFAST 90 tablet 3   LINZESS 290 MCG CAPS capsule TAKE ONE CAPSULE BY MOUTH BEFORE BREAKFAST 90 capsule 3   losartan (COZAAR) 25 MG tablet TAKE ONE TABLET BY MOUTH EVERY EVENING 90 tablet 0   metFORMIN (GLUCOPHAGE) 500 MG tablet TAKE TWO TABLETS BY MOUTH EVERY MORNING and TAKE ONE TABLET EVERY EVENING 270 tablet 2   Metoprolol Tartrate 75 MG TABS TAKE ONE TABLET BY MOUTH EVERY MORNING and TAKE ONE TABLET BY MOUTH EVERY EVENING 180 tablet 2   montelukast (SINGULAIR) 10 MG tablet TAKE ONE TABLET BY MOUTH EVERY EVENING 90 tablet 2   Multiple Vitamin (MULTIVITAMIN WITH MINERALS) TABS tablet Take 1 tablet by mouth daily.     NARCAN 4 MG/0.1ML LIQD nasal spray kit      NEOMYCIN-POLYMYXIN-HYDROCORTISONE (CORTISPORIN) 1 % SOLN OTIC solution Place 3 drops into the left ear 4 (four) times daily. 10 mL 0   Omega-3 Fatty Acids (FISH OIL) 1000 MG CAPS Take 1,000 mg by mouth at bedtime.     Sodium Chloride-Sodium Bicarb (NETI POT SINUS WASH) 2300-700 MG KIT DO nasal rinse twice a week as needed 1 kit 0   spironolactone (ALDACTONE) 25 MG tablet Take 1 tablet (25 mg total) by mouth every morning. 90 tablet 2   tiZANidine (ZANAFLEX) 2 MG tablet Take by  mouth.     topiramate (TOPAMAX) 25 MG tablet Take 1 tablet (25 mg total) by mouth 2 (two) times daily. 60 tablet 0   traMADol (ULTRAM) 50 MG tablet Take 1 tablet (50 mg total) by mouth 2 (two) times daily as needed. 30 tablet 2   vitamin B-12 (CYANOCOBALAMIN) 1000 MCG tablet Take 1,000 mcg by  mouth daily.     Vitamin D, Ergocalciferol, (DRISDOL) 1.25 MG (50000 UNIT) CAPS capsule Take 1 capsule (50,000 Units total) by mouth once a week. (Patient not taking: Reported on 05/07/2022) 12 capsule 0   No current facility-administered medications for this visit.   Allergies  Allergen Reactions   Amlodipine Besylate Other (See Comments)    Edema in lower extremities Other reaction(s): Other Edema in lower extremities   Celexa [Citalopram Hydrobromide] Other (See Comments)    "Made me feel out of it"   Gabapentin Other (See Comments)    Contributed to lower extremity edema   Diflucan [Fluconazole] Rash     Review of Systems: All systems reviewed and negative except where noted in HPI.    No results found.  Physical Exam: BP (!) 108/58 (BP Location: Left Wrist, Patient Position: Sitting, Cuff Size: Normal)   Pulse 64   Ht _0  (1.651 m) Comment: unable to obtain-pt couldn't stand straight  Wt 275 lb (124.7 kg)   BMI 45.76 kg/m  Constitutional: Pleasant, morbidly obese African American female in no acute distress.  Seated in wheelchair HEENT: Normocephalic and atraumatic. Conjunctivae are normal. No scleral icterus. Cardiovascular: Normal rate, regular rhythm.  Pulmonary/chest: Effort normal and breath sounds normal. No wheezing, rales or rhonchi. Abdominal: Soft, nondistended, nontender. Bowel sounds active throughout. There are no masses palpable. No hepatomegaly. Extremities: no edema Lymphadenopathy: No cervical adenopathy noted. Neurological: Alert and oriented to person place and time. Skin: Skin is warm and dry. No rashes noted. Psychiatric: Normal mood and affect. Behavior is normal.  CBC    Component Value Date/Time   WBC 12.0 (H) 01/04/2021 1555   RBC 4.34 01/04/2021 1555   HGB 12.8 01/04/2021 1555   HGB 12.0 01/20/2017 1001   HCT 41.0 01/04/2021 1555   HCT 38.2 01/20/2017 1001   HCT 41 06/25/2011 1053   PLT 411 (H) 01/04/2021 1555   PLT 356 01/20/2017  1001   MCV 94.5 01/04/2021 1555   MCV 90 01/20/2017 1001   MCV 91.1 06/25/2011 1053   MCH 29.5 01/04/2021 1555   MCHC 31.2 01/04/2021 1555   RDW 13.0 01/04/2021 1555   RDW 13.1 01/20/2017 1001   LYMPHSABS 4.6 (H) 01/04/2021 1555   LYMPHSABS 3.4 (H) 01/20/2017 1001   MONOABS 1.6 (H) 01/04/2021 1555   EOSABS 0.1 01/04/2021 1555   EOSABS 0.1 01/20/2017 1001   BASOSABS 0.1 01/04/2021 1555   BASOSABS 0.0 01/20/2017 1001    CMP     Component Value Date/Time   NA 143 11/17/2021 0941   K 4.4 11/17/2021 0941   K 4.2 06/25/2011 1112   CL 103 11/17/2021 0941   CO2 26 11/17/2021 0941   GLUCOSE 82 11/17/2021 0941   GLUCOSE 133 (H) 01/04/2021 1555   BUN 11 11/17/2021 0941   CREATININE 0.90 11/17/2021 0941   CREATININE 0.80 09/22/2016 1539   CALCIUM 9.9 11/17/2021 0941   CALCIUM 9.4 06/25/2011 1112   PROT 7.4 11/17/2021 0941   ALBUMIN 4.3 11/17/2021 0941   AST 20 11/17/2021 0941   AST 16 06/25/2011 1112   ALT 20 11/17/2021 0941   ALKPHOS  70 11/17/2021 0941   ALKPHOS 54 06/25/2011 1112   BILITOT 0.4 11/17/2021 0941   BILITOT 0.6 06/25/2011 1112   GFRNONAA >60 01/04/2021 1555   GFRNONAA 82 09/22/2016 1539   GFRAA >60 12/26/2019 1139   GFRAA >89 09/22/2016 1539     ASSESSMENT AND PLAN: 66 year old female with morbid obesity, OA, DM, GERD and IBS-C, currently with fairly regular bowel movements, but with persistent left sided fullness/discomfort and unsatisfactory stools despite Linzess.  Although she used to take Miralax as well, she stopped because of cost concerns.  I suspect her left-sided abdominal discomfort is likely related to her IBS and unsatisfactory stools.  Suggest that she try taking a daily psyllium based fiber supplement.  This will likely be cheaper the MiraLAX and may provide more satisfactory stools.  Suggest its okay to occasionally take the MiraLAX as well if she has not had a good bowel movement 1 or 2 days.  I reassured her that it is okay to continue taking  Linzess long-term. She will be due for her next colonoscopy next March.  IBS-C -Continue Linzess -Add psyllium daily -PRN Miralax  History of colon polyps - Colonoscopy due Mar 2024  Shaquille Murdy E. Candis Schatz, MD [facility]  CC:  Ladell Pier, MD

## 2022-05-07 NOTE — Patient Instructions (Signed)
_______________________________________________________  If you are age 64 or older, your body mass index should be between 23-30. Your Body mass index is 45.76 kg/m. If this is out of the aforementioned range listed, please consider follow up with your Primary Care Provider.  If you are age 69 or younger, your body mass index should be between 19-25. Your Body mass index is 45.76 kg/m. If this is out of the aformentioned range listed, please consider follow up with your Primary Care Provider.   Continue Linzess 290 mcg  Take Psyllium husk daily (Metamucil)  Take Miralax as needed.  The [place] [facility] providers would like to encourage you to use [facility] to communicate with providers for non-urgent requests or questions.  Due to long hold times on the telephone, sending your provider a message by [facility] may be a faster and more efficient way to get a response.  Please allow 48 business hours for a response.  Please remember that this is for non-urgent requests.   It was a pleasure to see you today!  Thank you for trusting me with your gastrointestinal care!    Scott E.Candis Schatz, MD

## 2022-05-13 ENCOUNTER — Encounter (INDEPENDENT_AMBULATORY_CARE_PROVIDER_SITE_OTHER): Payer: Self-pay | Admitting: Bariatrics

## 2022-05-13 NOTE — Progress Notes (Signed)
Chief Complaint:   OBESITY Robin [name] is here to discuss her progress with her obesity treatment plan along with follow-up of her obesity related diagnoses. Robin [name] is on the Category 3 Plan and states she is following her eating plan approximately 40% of the time. Robin [name] states she is doing chair exercise for 30-45 minutes 3-4 times per week.  Today's visit was #: 14 Starting weight: 310 lbs Starting date: 06/03/2021 Today's weight: 274 lbs Today's date: 05/04/2022 Total lbs lost to date: 36 Total lbs lost since last in-office visit: 6  Interim History: [name] is down 6 additional pounds since her last visit. She has been cutting back on eating and snacking.   Subjective:   1. Diabetes mellitus type 2 in obese [facility]) Robin [name] is taking Trulicity and metformin.   2. Other depression, with emotional eating binges Robin [name] is currently taking Wellbutrin.   Assessment/Plan:   1. Diabetes mellitus type 2 in obese [facility]) Robin [name] will continue her medications as directed. We will refill Trulicity 3 mg once weekly for 1 month.  - Dulaglutide (TRULICITY) 3 TM/2.2QJ SOPN; Inject 3 mg as directed once a week.  Dispense: 2 mL; Refill: 0  2. Other depression, with emotional eating binges Robin [name] agreed to discontinue Wellbutrin and start Topamax 25 mg BID with no refills.   - topiramate (TOPAMAX) 25 MG tablet; Take 1 tablet (25 mg total) by mouth 2 (two) times daily.  Dispense: 60 tablet; Refill: 0  3. Obesity, Current BMI 41.7 Robin [name] is currently in the action stage of change. As such, her goal is to continue with weight loss efforts. She has agreed to the Category 3 Plan.   Meal planning and intentional eating was discussed. She will keep protein, non-starchy, and water intake high.   Exercise goals: As is.   Behavioral modification strategies: increasing lean protein intake, decreasing simple carbohydrates, increasing vegetables, increasing water intake, decreasing eating out, no  skipping meals, meal planning and cooking strategies, keeping healthy foods in the home, and planning for success.  Robin [name] has agreed to follow-up with our clinic in 3 to 4 weeks. She was informed of the importance of frequent follow-up visits to maximize her success with intensive lifestyle modifications for her multiple health conditions.   Objective:   Blood pressure 122/70, pulse (!) 55, temperature 98 F (36.7 C), height '5\' 8"'$  (1.727 m), weight 274 lb (124.3 kg), SpO2 97 %. Body mass index is 41.66 kg/m.  General: Cooperative, alert, well developed, in no acute distress. HEENT: Conjunctivae and lids unremarkable. Cardiovascular: Regular rhythm.  Lungs: Normal work of breathing. Neurologic: No focal deficits.   Lab Results  Component Value Date   CREATININE 0.90 11/17/2021   BUN 11 11/17/2021   NA 143 11/17/2021   K 4.4 11/17/2021   CL 103 11/17/2021   CO2 26 11/17/2021   Lab Results  Component Value Date   ALT 20 11/17/2021   AST 20 11/17/2021   ALKPHOS 70 11/17/2021   BILITOT 0.4 11/17/2021   Lab Results  Component Value Date   HGBA1C 5.3 03/10/2022   HGBA1C 5.7 (H) 11/17/2021   HGBA1C 5.7 11/10/2021   HGBA1C 6.1 (H) 06/03/2021   HGBA1C 6.7 12/02/2020   Lab Results  Component Value Date   INSULIN 28.2 (H) 11/17/2021   INSULIN 48.4 (H) 06/03/2021   Lab Results  Component Value Date   TSH 0.891 11/10/2021   Lab Results  Component Value Date   CHOL 121 06/03/2021   HDL 52 06/03/2021  LDLCALC 54 06/03/2021   TRIG 73 06/03/2021   CHOLHDL 2.3 06/03/2021   Lab Results  Component Value Date   VD25OH 61.7 06/03/2021   Lab Results  Component Value Date   WBC 12.0 (H) 01/04/2021   HGB 12.8 01/04/2021   HCT 41.0 01/04/2021   MCV 94.5 01/04/2021   PLT 411 (H) 01/04/2021   Lab Results  Component Value Date   IRON 31 03/04/2015   TIBC 340 03/04/2015   FERRITIN 34 03/04/2015   Attestation Statements:   Reviewed by clinician on day of visit:  allergies, medications, problem list, medical history, surgical history, family history, social history, and previous encounter notes.   Wilhemena Durie, am acting as Location manager for [employer], DO.  I have reviewed the above documentation for accuracy and completeness, and I agree with the above. Jearld Lesch, DO

## 2022-05-18 ENCOUNTER — Telehealth: Payer: Self-pay

## 2022-05-18 ENCOUNTER — Other Ambulatory Visit (HOSPITAL_COMMUNITY): Payer: Self-pay | Admitting: Internal Medicine

## 2022-05-18 ENCOUNTER — Other Ambulatory Visit: Payer: Self-pay | Admitting: Physician Assistant

## 2022-05-18 MED ORDER — TRAMADOL HCL 50 MG PO TABS: 50.0000 mg | ORAL_TABLET | Freq: Two times a day (BID) | ORAL | 2 refills | Status: DC | PRN

## 2022-05-18 NOTE — Telephone Encounter (Signed)
Patient would like a Rx refill on Tramadol sent to her pharmacy.  Cb# [phone].  Please advise.  Thank you.

## 2022-05-18 NOTE — Telephone Encounter (Signed)
Sent in

## 2022-05-18 NOTE — Telephone Encounter (Signed)
Called and notified patient.

## 2022-06-03 ENCOUNTER — Encounter (INDEPENDENT_AMBULATORY_CARE_PROVIDER_SITE_OTHER): Payer: Self-pay | Admitting: Bariatrics

## 2022-06-03 ENCOUNTER — Ambulatory Visit (INDEPENDENT_AMBULATORY_CARE_PROVIDER_SITE_OTHER): Payer: Medicaid Other | Admitting: Bariatrics

## 2022-06-03 VITALS — BP 116/75 | HR 61 | Temp 98.2°F | Ht 68.0 in | Wt 272.0 lb

## 2022-06-03 DIAGNOSIS — E1169 Type 2 diabetes mellitus with other specified complication: Secondary | ICD-10-CM

## 2022-06-03 DIAGNOSIS — Z6841 Body Mass Index (BMI) 40.0 and over, adult: Secondary | ICD-10-CM

## 2022-06-03 DIAGNOSIS — F3289 Other specified depressive episodes: Secondary | ICD-10-CM

## 2022-06-03 DIAGNOSIS — Z7985 Long-term (current) use of injectable non-insulin antidiabetic drugs: Secondary | ICD-10-CM

## 2022-06-03 DIAGNOSIS — E669 Obesity, unspecified: Secondary | ICD-10-CM

## 2022-06-03 MED ORDER — TOPIRAMATE 50 MG PO TABS: 50.0000 mg | ORAL_TABLET | Freq: Two times a day (BID) | ORAL | 0 refills | Status: DC

## 2022-06-03 MED ORDER — TRULICITY 3 MG/0.5ML ~~LOC~~ SOAJ
3.0000 mg | SUBCUTANEOUS | 0 refills | Status: DC
Start: 2022-06-03 — End: 2022-07-09

## 2022-06-07 ENCOUNTER — Other Ambulatory Visit: Payer: Self-pay | Admitting: Internal Medicine

## 2022-06-07 DIAGNOSIS — E1142 Type 2 diabetes mellitus with diabetic polyneuropathy: Secondary | ICD-10-CM

## 2022-06-09 NOTE — Telephone Encounter (Signed)
Requested Prescriptions  Pending Prescriptions Disp Refills  . hydrochlorothiazide (HYDRODIURIL) 25 MG tablet [Pharmacy Med Name: hydrochlorothiazide 25 mg tablet] 90 tablet 1    Sig: TAKE ONE TABLET BY MOUTH EVERY MORNING     Cardiovascular: Diuretics - Thiazide Failed - 06/07/2022  8:00 AM      Failed - Cr in normal range and within 180 days    Creat  Date Value Ref Range Status  09/22/2016 0.80 0.50 - 1.05 mg/dL Final    Comment:      For patients > or = 64 years of age: The upper reference limit for Creatinine is approximately 13% higher for people identified as African-American.      Creatinine, Ser  Date Value Ref Range Status  11/17/2021 0.90 0.57 - 1.00 mg/dL Final         Failed - K in normal range and within 180 days    Potassium  Date Value Ref Range Status  11/17/2021 4.4 3.5 - 5.2 mmol/L Final  06/25/2011 4.2 mmol/L Final         Failed - Na in normal range and within 180 days    Sodium  Date Value Ref Range Status  11/17/2021 143 134 - 144 mmol/L Final         Passed - Last BP in normal range    BP Readings from Last 1 Encounters:  06/03/22 116/75         Passed - Valid encounter within last 6 months    Recent Outpatient Visits          3 months ago Type 2 diabetes mellitus with morbid obesity ([place])   [place] Ladell Pier, MD   3 months ago Mixed stress and urge urinary incontinence   [place], MD   7 months ago Type 2 diabetes mellitus with morbid obesity [facility])   [place], MD   10 months ago Need for shingles vaccine   [place], Stephen L, RPH-CPP   11 months ago Type 2 diabetes mellitus with morbid obesity [facility])   [place], MD      Future Appointments            In 1 month Wynetta Emery, Dalbert Batman, MD [place]           . gabapentin (NEURONTIN) 600 MG tablet [Pharmacy Med Name: gabapentin 600 mg tablet] 315 tablet 2    Sig: TAKE ONE TABLET BY MOUTH EVERY MORNING and TAKE ONE TABLET BY MOUTH AT NOON and TAKE ONE TABLET EVERY EVENING     Neurology: Anticonvulsants - gabapentin Passed - 06/07/2022  8:00 AM      Passed - Cr in normal range and within 360 days    Creat  Date Value Ref Range Status  09/22/2016 0.80 0.50 - 1.05 mg/dL Final    Comment:      For patients > or = 64 years of age: The upper reference limit for Creatinine is approximately 13% higher for people identified as African-American.      Creatinine, Ser  Date Value Ref Range Status  11/17/2021 0.90 0.57 - 1.00 mg/dL Final         Passed - Completed PHQ-2 or PHQ-9 in the last 360 days      Passed - Valid  encounter within last 12 months    Recent Outpatient Visits          3 months ago Type 2 diabetes mellitus with morbid obesity ([place])   [place] Ladell Pier, MD   3 months ago Mixed stress and urge urinary incontinence   [place], MD   7 months ago Type 2 diabetes mellitus with morbid obesity [facility])   [place], MD   10 months ago Need for shingles vaccine   [place], Stephen L, RPH-CPP   11 months ago Type 2 diabetes mellitus with morbid obesity [facility])   [place], MD      Future Appointments            In 1 month Wynetta Emery, Dalbert Batman, MD [place]           . celecoxib (CELEBREX) 100 MG capsule [Pharmacy Med Name: celecoxib 100 mg capsule] 180 capsule 1    Sig: TAKE ONE CAPSULE BY MOUTH TWICE DAILY     Analgesics:  COX2 Inhibitors Failed - 06/07/2022  8:00 AM      Failed - Manual Review: Labs are only required if  the patient has taken medication for more than 8 weeks.      Failed - HGB in normal range and within 360 days    Hemoglobin  Date Value Ref Range Status  01/04/2021 12.8 12.0 - 15.0 g/dL Final  01/20/2017 12.0 11.1 - 15.9 g/dL Final         Failed - HCT in normal range and within 360 days    HCT  Date Value Ref Range Status  01/04/2021 41.0 36.0 - 46.0 % Final  06/25/2011 41 % Final   Hematocrit  Date Value Ref Range Status  01/20/2017 38.2 34.0 - 46.6 % Final         Passed - Cr in normal range and within 360 days    Creat  Date Value Ref Range Status  09/22/2016 0.80 0.50 - 1.05 mg/dL Final    Comment:      For patients > or = 64 years of age: The upper reference limit for Creatinine is approximately 13% higher for people identified as African-American.      Creatinine, Ser  Date Value Ref Range Status  11/17/2021 0.90 0.57 - 1.00 mg/dL Final         Passed - AST in normal range and within 360 days    AST  Date Value Ref Range Status  11/17/2021 20 0 - 40 IU/L Final  06/25/2011 16 U/L Final         Passed - ALT in normal range and within 360 days    ALT  Date Value Ref Range Status  11/17/2021 20 0 - 32 IU/L Final         Passed - eGFR is 30 or above and within 360 days    GFR, Est African American  Date Value Ref Range Status  09/22/2016 >89 >=60 mL/min Final   GFR calc Af Amer  Date Value Ref Range Status  12/26/2019 >60 >60 mL/min Final   GFR, Est Non African American  Date Value Ref Range Status  09/22/2016 82 >=60 mL/min Final   GFR, Estimated  Date Value Ref Range Status  01/04/2021 >60 >60  mL/min Final    Comment:    (NOTE) Calculated using the CKD-EPI Creatinine Equation (2021)    GFR  Date Value Ref Range Status  03/08/2018 87.84 >60.00 mL/min Final   eGFR  Date Value Ref Range Status  11/17/2021 72 >59 mL/min/1.73 Final         Passed - Patient is not pregnant      Passed - Valid encounter within last 12 months    Recent  Outpatient Visits          3 months ago Type 2 diabetes mellitus with morbid obesity ([place])   [place] Ladell Pier, MD   3 months ago Mixed stress and urge urinary incontinence   [place], MD   7 months ago Type 2 diabetes mellitus with morbid obesity [facility])   [place], MD   10 months ago Need for shingles vaccine   [place], Stephen L, RPH-CPP   11 months ago Type 2 diabetes mellitus with morbid obesity [facility])   [place], MD      Future Appointments            In 1 month Wynetta Emery, Dalbert Batman, MD [place]

## 2022-06-09 NOTE — Telephone Encounter (Signed)
Requested medication (s) are due for refill today: yes  Requested medication (s) are on the active medication list: yes  Last refill:  celebrex 12/03/21 #180/1, hctz 12/16/21 #90/1  Future visit scheduled: yes  Notes to clinic:  Unable to refill per protocol due to failed labs, no updated results.    Requested Prescriptions  Pending Prescriptions Disp Refills   hydrochlorothiazide (HYDRODIURIL) 25 MG tablet [Pharmacy Med Name: hydrochlorothiazide 25 mg tablet] 90 tablet 1    Sig: TAKE ONE TABLET BY MOUTH EVERY MORNING     Cardiovascular: Diuretics - Thiazide Failed - 06/07/2022  8:00 AM      Failed - Cr in normal range and within 180 days    Creat  Date Value Ref Range Status  09/22/2016 0.80 0.50 - 1.05 mg/dL Final    Comment:      For patients > or = 64 years of age: The upper reference limit for Creatinine is approximately 13% higher for people identified as African-American.      Creatinine, Ser  Date Value Ref Range Status  11/17/2021 0.90 0.57 - 1.00 mg/dL Final         Failed - K in normal range and within 180 days    Potassium  Date Value Ref Range Status  11/17/2021 4.4 3.5 - 5.2 mmol/L Final  06/25/2011 4.2 mmol/L Final         Failed - Na in normal range and within 180 days    Sodium  Date Value Ref Range Status  11/17/2021 143 134 - 144 mmol/L Final         Passed - Last BP in normal range    BP Readings from Last 1 Encounters:  06/03/22 116/75         Passed - Valid encounter within last 6 months    Recent Outpatient Visits           3 months ago Type 2 diabetes mellitus with morbid obesity ([place])   [place] Ladell Pier, MD   3 months ago Mixed stress and urge urinary incontinence   [place] Karle Plumber B, MD   7 months ago Type 2 diabetes mellitus with morbid obesity [facility])   [place] Ladell Pier, MD   10 months ago Need for  shingles vaccine   [place], Stephen L, RPH-CPP   11 months ago Type 2 diabetes mellitus with morbid obesity [facility])   [place], MD       Future Appointments             In 1 month Ladell Pier, MD [place]             celecoxib (CELEBREX) 100 MG capsule [Pharmacy Med Name: celecoxib 100 mg capsule] 180 capsule 1    Sig: TAKE ONE CAPSULE BY MOUTH TWICE DAILY     Analgesics:  COX2 Inhibitors Failed - 06/07/2022  8:00 AM      Failed - Manual Review: Labs are only required if the patient has taken medication for more than 8 weeks.      Failed - HGB in normal range and within 360 days    Hemoglobin  Date Value Ref Range Status  01/04/2021 12.8 12.0 - 15.0 g/dL Final  01/20/2017 12.0 11.1 - 15.9 g/dL Final  Failed - HCT in normal range and within 360 days    HCT  Date Value Ref Range Status  01/04/2021 41.0 36.0 - 46.0 % Final  06/25/2011 41 % Final   Hematocrit  Date Value Ref Range Status  01/20/2017 38.2 34.0 - 46.6 % Final         Passed - Cr in normal range and within 360 days    Creat  Date Value Ref Range Status  09/22/2016 0.80 0.50 - 1.05 mg/dL Final    Comment:      For patients > or = 64 years of age: The upper reference limit for Creatinine is approximately 13% higher for people identified as African-American.      Creatinine, Ser  Date Value Ref Range Status  11/17/2021 0.90 0.57 - 1.00 mg/dL Final         Passed - AST in normal range and within 360 days    AST  Date Value Ref Range Status  11/17/2021 20 0 - 40 IU/L Final  06/25/2011 16 U/L Final         Passed - ALT in normal range and within 360 days    ALT  Date Value Ref Range Status  11/17/2021 20 0 - 32 IU/L Final         Passed - eGFR is 30 or above and within 360 days    GFR, Est African American  Date Value Ref Range Status  09/22/2016 >89  >=60 mL/min Final   GFR calc Af Amer  Date Value Ref Range Status  12/26/2019 >60 >60 mL/min Final   GFR, Est Non African American  Date Value Ref Range Status  09/22/2016 82 >=60 mL/min Final   GFR, Estimated  Date Value Ref Range Status  01/04/2021 >60 >60 mL/min Final    Comment:    (NOTE) Calculated using the CKD-EPI Creatinine Equation (2021)    GFR  Date Value Ref Range Status  03/08/2018 87.84 >60.00 mL/min Final   eGFR  Date Value Ref Range Status  11/17/2021 72 >59 mL/min/1.73 Final         Passed - Patient is not pregnant      Passed - Valid encounter within last 12 months    Recent Outpatient Visits           3 months ago Type 2 diabetes mellitus with morbid obesity ([place])   [place] Ladell Pier, MD   3 months ago Mixed stress and urge urinary incontinence   [place], Deborah B, MD   7 months ago Type 2 diabetes mellitus with morbid obesity [facility])   [place] Ladell Pier, MD   10 months ago Need for shingles vaccine   [place], Jarome Matin, RPH-CPP   11 months ago Type 2 diabetes mellitus with morbid obesity [facility])   [place] Ladell Pier, MD       Future Appointments             In 1 month Wynetta Emery, Dalbert Batman, MD [place]            Signed Prescriptions Disp Refills   gabapentin (NEURONTIN) 600 MG tablet 270 tablet 1    Sig: TAKE ONE TABLET BY MOUTH EVERY MORNING and TAKE ONE TABLET BY MOUTH AT NOON and TAKE ONE TABLET EVERY EVENING  Neurology: Anticonvulsants - gabapentin Passed - 06/07/2022  8:00 AM      Passed - Cr in normal range and within 360 days    Creat  Date Value Ref Range Status  09/22/2016 0.80 0.50 - 1.05 mg/dL Final    Comment:      For patients > or = 64 years of age: The upper reference limit  for Creatinine is approximately 13% higher for people identified as African-American.      Creatinine, Ser  Date Value Ref Range Status  11/17/2021 0.90 0.57 - 1.00 mg/dL Final         Passed - Completed PHQ-2 or PHQ-9 in the last 360 days      Passed - Valid encounter within last 12 months    Recent Outpatient Visits           3 months ago Type 2 diabetes mellitus with morbid obesity ([place])   [place] Ladell Pier, MD   3 months ago Mixed stress and urge urinary incontinence   [place], MD   7 months ago Type 2 diabetes mellitus with morbid obesity [facility])   [place], MD   10 months ago Need for shingles vaccine   [place], Stephen L, RPH-CPP   11 months ago Type 2 diabetes mellitus with morbid obesity [facility])   [place], MD       Future Appointments             In 1 month Wynetta Emery, Dalbert Batman, MD [place]

## 2022-06-10 ENCOUNTER — Other Ambulatory Visit (INDEPENDENT_AMBULATORY_CARE_PROVIDER_SITE_OTHER): Payer: Self-pay | Admitting: Bariatrics

## 2022-06-10 ENCOUNTER — Other Ambulatory Visit: Payer: Self-pay | Admitting: Internal Medicine

## 2022-06-10 ENCOUNTER — Other Ambulatory Visit: Payer: Self-pay | Admitting: Gastroenterology

## 2022-06-10 DIAGNOSIS — F3289 Other specified depressive episodes: Secondary | ICD-10-CM

## 2022-06-10 NOTE — Telephone Encounter (Signed)
Will need ov (virtual or in person) for further refills. Limited refills provided today.

## 2022-06-11 NOTE — Telephone Encounter (Signed)
I called this patient to schedule and she had no idea what I was talking about.  She said "I have a dr. Enos Fling here".  I asked where "up here" was and she said in [facility].

## 2022-06-11 NOTE — Telephone Encounter (Signed)
Noted. No further refills. She can transfer her prescriptions.

## 2022-06-15 ENCOUNTER — Other Ambulatory Visit (INDEPENDENT_AMBULATORY_CARE_PROVIDER_SITE_OTHER): Payer: Self-pay | Admitting: Bariatrics

## 2022-06-15 DIAGNOSIS — F3289 Other specified depressive episodes: Secondary | ICD-10-CM

## 2022-06-15 NOTE — Progress Notes (Unsigned)
Chief Complaint:   OBESITY Robin [name] is here to discuss her progress with her obesity treatment plan along with follow-up of her obesity related diagnoses. Robin [name] is on the Category 3 Plan and states she is following her eating plan approximately 50% of the time. Robin [name] states she is doing chair exercise for 45 minutes 3-4 times per week.  Today's visit was #: 15 Starting weight: 310 lbs Starting date: 06/03/2021 Today's weight: 272 lbs Today's date: 06/03/2022 Total lbs lost to date: 38 Total lbs lost since last in-office visit: 2  Interim History: Robin [name] is down another 2 pounds since her last visit.  Subjective:   1. Diabetes mellitus type 2 in obese [name]) Robin [name] is taking Trulicity.  2. Other depression, with emotional eating binges Robin [name] notes increase in snacking.   Assessment/Plan:   1. Diabetes mellitus type 2 in obese [facility]) [place] will continue Trulicity 3 mg once weekly, and we will refill for 1 month.  - Dulaglutide (TRULICITY) 3 YQ/0.3KV SOPN; Inject 3 mg as directed once a week.  Dispense: 2 mL; Refill: 0  2. Other depression, with emotional eating binges Robin [name] agreed to increase Topamax from 25 mg to 50 mg twice daily, with no refills.  - topiramate (TOPAMAX) 50 MG tablet; Take 1 tablet (50 mg total) by mouth 2 (two) times daily.  Dispense: 60 tablet; Refill: 0  3. Obesity, Current BMI 41.4 Robin [name] is currently in the action stage of change. As such, her goal is to continue with weight loss efforts. She has agreed to the Category 3 Plan.   Meal planning and intentional eating were discussed.  Exercise goals: As is.   Behavioral modification strategies: increasing lean protein intake, decreasing simple carbohydrates, increasing vegetables, increasing water intake, decreasing eating out, no skipping meals, meal planning and cooking strategies, keeping healthy foods in the home, and planning for success.  Robin [name] has agreed to follow-up with our  clinic in 4 weeks. She was informed of the importance of frequent follow-up visits to maximize her success with intensive lifestyle modifications for her multiple health conditions.   Objective:   Blood pressure 116/75, pulse 61, temperature 98.2 F (36.8 C), height '5\' 8"'$  (1.727 m), weight 272 lb (123.4 kg), SpO2 96 %. Body mass index is 41.36 kg/m.  General: Cooperative, alert, well developed, in no acute distress. HEENT: Conjunctivae and lids unremarkable. Cardiovascular: Regular rhythm.  Lungs: Normal work of breathing. Neurologic: No focal deficits.   Lab Results  Component Value Date   CREATININE 0.90 11/17/2021   BUN 11 11/17/2021   NA 143 11/17/2021   K 4.4 11/17/2021   CL 103 11/17/2021   CO2 26 11/17/2021   Lab Results  Component Value Date   ALT 20 11/17/2021   AST 20 11/17/2021   ALKPHOS 70 11/17/2021   BILITOT 0.4 11/17/2021   Lab Results  Component Value Date   HGBA1C 5.3 03/10/2022   HGBA1C 5.7 (H) 11/17/2021   HGBA1C 5.7 11/10/2021   HGBA1C 6.1 (H) 06/03/2021   HGBA1C 6.7 12/02/2020   Lab Results  Component Value Date   INSULIN 28.2 (H) 11/17/2021   INSULIN 48.4 (H) 06/03/2021   Lab Results  Component Value Date   TSH 0.891 11/10/2021   Lab Results  Component Value Date   CHOL 121 06/03/2021   HDL 52 06/03/2021   LDLCALC 54 06/03/2021   TRIG 73 06/03/2021   CHOLHDL 2.3 06/03/2021   Lab Results  Component Value Date   VD25OH 61.7 06/03/2021  Lab Results  Component Value Date   WBC 12.0 (H) 01/04/2021   HGB 12.8 01/04/2021   HCT 41.0 01/04/2021   MCV 94.5 01/04/2021   PLT 411 (H) 01/04/2021   Lab Results  Component Value Date   IRON 31 03/04/2015   TIBC 340 03/04/2015   FERRITIN 34 03/04/2015   Attestation Statements:   Reviewed by clinician on day of visit: allergies, medications, problem list, medical history, surgical history, family history, social history, and previous encounter notes.   Wilhemena Durie, am acting as  Location manager for [employer], DO.  I have reviewed the above documentation for accuracy and completeness, and I agree with the above. Jearld Lesch, DO

## 2022-06-17 ENCOUNTER — Encounter (INDEPENDENT_AMBULATORY_CARE_PROVIDER_SITE_OTHER): Payer: Self-pay | Admitting: Bariatrics

## 2022-06-20 ENCOUNTER — Other Ambulatory Visit: Payer: Self-pay | Admitting: Physician Assistant

## 2022-06-22 ENCOUNTER — Encounter: Payer: Self-pay | Admitting: Podiatry

## 2022-06-22 ENCOUNTER — Ambulatory Visit (INDEPENDENT_AMBULATORY_CARE_PROVIDER_SITE_OTHER): Payer: Medicaid Other | Admitting: Podiatry

## 2022-06-22 DIAGNOSIS — E1142 Type 2 diabetes mellitus with diabetic polyneuropathy: Secondary | ICD-10-CM

## 2022-06-22 DIAGNOSIS — M79675 Pain in left toe(s): Secondary | ICD-10-CM | POA: Diagnosis not present

## 2022-06-22 DIAGNOSIS — B351 Tinea unguium: Secondary | ICD-10-CM | POA: Diagnosis not present

## 2022-06-22 DIAGNOSIS — M79674 Pain in right toe(s): Secondary | ICD-10-CM

## 2022-06-22 NOTE — Progress Notes (Signed)
  Subjective:  Patient ID: Robin [name], female    DOB: 03-28-1956,  MRN: [id number]  Robin [name] presents to clinic today for at risk foot care with history of diabetic neuropathy and painful elongated mycotic toenails 1-5 bilaterally which are tender when wearing enclosed shoe gear. Pain is relieved with periodic professional debridement.  Patient states blood glucose was 103 mg/dl today. Last known  HgA1c was 5.6%.    New problem(s): None.   PCP is Ladell Pier, MD , and last visit was  March 10, 2022.  Allergies  Allergen Reactions   Amlodipine Besylate Other (See Comments)    Edema in lower extremities Other reaction(s): Other Edema in lower extremities   Celexa [Citalopram Hydrobromide] Other (See Comments)    "Made me feel out of it"   Gabapentin Other (See Comments)    Contributed to lower extremity edema   Diflucan [Fluconazole] Rash    Review of Systems: Negative except as noted in the HPI.  Objective: No changes noted in today's physical examination. Robin [name] is a pleasant 66 y.o. female in NAD. AAO x 3.  Vascular Examination: Capillary refill time to digits immediate b/l. Palpable DP pulse(s) b/l LE. Palpable PT pulse(s) b/l LE. Pedal hair sparse. No pain with calf compression b/l. Lower extremity skin temperature gradient within normal limits. Trace edema noted BLE. No cyanosis or clubbing noted b/l LE.  Dermatological Examination: Pedal skin is warm and supple b/l LE. No open wounds b/l LE. No interdigital macerations noted b/l LE. Toenails 1-5 b/l well maintained with adequate length. No erythema, no edema, no drainage, no fluctuance. Procedure site of both borders of left hallux and both borders of right hallux noted to be completely healed with no erythema, no edema, no drainage, no purulence.   Neurological Examination: Pt has subjective symptoms of neuropathy. Protective sensation intact 5/5 intact bilaterally with 10g monofilament b/l. Vibratory  sensation intact b/l.  Musculoskeletal Examination: Muscle strength 5/5 to all lower extremity muscle groups bilaterally. No pain, crepitus or joint limitation noted with ROM bilateral LE. Utilizes motorized chair for mobility assistance.  Assessment/Plan: 1. Pain due to onychomycosis of toenails of both feet   2. Diabetic peripheral neuropathy associated with type 2 diabetes mellitus ([place])   -Consent given for treatment as described below: -Examined patient. -Toenails 1-5 b/l were debrided in length and girth with sterile nail nippers and dremel without iatrogenic bleeding.  -Patient/POA to call should there be question/concern in the interim.   Return in about 3 months (around 09/21/2022).  Marzetta Board, DPM

## 2022-06-25 ENCOUNTER — Ambulatory Visit (INDEPENDENT_AMBULATORY_CARE_PROVIDER_SITE_OTHER): Payer: Medicaid Other | Admitting: Clinical

## 2022-06-25 DIAGNOSIS — F331 Major depressive disorder, recurrent, moderate: Secondary | ICD-10-CM | POA: Diagnosis not present

## 2022-06-26 NOTE — Progress Notes (Signed)
   THERAPIST PROGRESS NOTE  Session Time: 35 minutes  Participation Level: Active  Behavioral Response: CasualAlertDepressed  Type of Therapy: Individual Therapy  Treatment Goals addressed: client will participate in at least 80% of scheduled individual psychotherapy sessions  ProgressTowards Goals: Progressing  Interventions: CBT and Supportive  Summary:  Robin [name] is a 66 y.o. female who presents for the scheduled session oriented times five, appropriately dressed, and friendly. Client denied hallucinations and delusions. Client reported on today she has been doing the same. Client reported no changes have happened ar home with her husband. Client reported her husband is very mean and it triggers her to have uncontrollable crying spells. Client reported the woman he has been seeing attends the meetings she does pertaining to [place] witness. Client reported the woman got on the meeting stating how she would be moving back to [place] but knows she said that so she herself would be aware. Client reported it was very disrespectful. Client reported her kids keep her motivated and she looks forward to seeing them again later this year. Client reported she talks to her sister often who is good support. Client reported she goes to her medical appointments but sometimes feels down because she wants to be able to walk. Client reported her left leg is hard to extend and she is working with her doctor to figure out how to improve. Evidence of progress towards goal:  client reported she is medication compliant 7 days per week. Client reported she uses family support 7 days per week to help with depression.  Suicidal/Homicidal: Nowithout intent/plan  Therapist Response:  Therapist began the appointment asking the client how she has been doing since last seen. Therapist used CBT to engage using active listening and positive emotional support. Therapist used CBT to collaborate and allow  the client time to discuss her thoughts and feelings about her family stressors. Therapist used CBT to normalize the clients emotions. Therapist used CBT ask the client to identify her progress with frequency of use with coping skills with continued practice in her daily activity.    Therapist assigned the client homework to practice self care.    Plan: Return again in 4 weeks.  Diagnosis: major depressive disorder, recurrent episode moderate  Collaboration of Care: Patient refused AEB none requested by the client.  Patient/Guardian was advised Release of Information must be obtained prior to any record release in order to collaborate their care with an outside provider. Patient/Guardian was advised if they have not already done so to contact the registration department to sign all necessary forms in order for [name] to release information regarding their care.   Consent: Patient/Guardian gives verbal consent for treatment and assignment of benefits for services provided during this visit. Patient/Guardian expressed understanding and agreed to proceed.   [place], LCSW 06/25/2022

## 2022-06-26 NOTE — Plan of Care (Signed)
  Problem: Depression CCP Problem  1  Goal: LTG: Robin [name] WILL SCORE LESS THAN 10 ON THE PATIENT HEALTH QUESTIONNAIRE (PHQ-9) Outcome: Progressing Goal: STG: Robin [name] WILL PARTICIPATE IN AT LEAST 80% OF SCHEDULED INDIVIDUAL PSYCHOTHERAPY SESSIONS Outcome: Progressing Goal: STG: Robin [name] WILL IDENTIFY 3 COGNITIVE PATTERNS AND BELIEFS THAT SUPPORT DEPRESSION Outcome: Progressing   

## 2022-07-01 NOTE — Patient Instructions (Signed)
Thank you for attending your appointment today.  -- RESTART Wellbutrin XL 150 mg daily -- Continue Cymbalta 90 mg daily -- Continue other medications as prescribed.  Please do not make any changes to medications without first discussing with your provider. If you are experiencing a psychiatric emergency, please call 911 or present to your nearest emergency department. Additional crisis, medication management, and therapy resources are included below.  [facility]  [address] [place],  [phone] or [phone] [facility] 24/7 FOR ANYONE [address] [place], [place] Fax: [phone] [email or web address] *Interpreters available *Accepts all insurance and uninsured for Urgent Care needs *Accepts Medicaid and uninsured for outpatient treatment (below)      ONLY FOR [facility]  Below:    Outpatient New Patient Assessment/Therapy Walk-ins:        Monday -Thursday 8am until slots are full.        Every Friday 1pm-4pm  (first come, first served)                   New Patient Psychiatry/Medication Management        Monday-Friday 8am-11am (first come, first served)               For all walk-ins we ask that you arrive by 7:15am, because patients will be seen in the order of arrival.
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
## 2022-07-02 ENCOUNTER — Encounter (HOSPITAL_COMMUNITY): Payer: Self-pay | Admitting: Psychiatry

## 2022-07-02 ENCOUNTER — Ambulatory Visit (INDEPENDENT_AMBULATORY_CARE_PROVIDER_SITE_OTHER): Payer: Medicaid Other | Admitting: Psychiatry

## 2022-07-02 VITALS — BP 119/63 | HR 61 | Ht 67.0 in

## 2022-07-02 DIAGNOSIS — F339 Major depressive disorder, recurrent, unspecified: Secondary | ICD-10-CM | POA: Diagnosis not present

## 2022-07-02 DIAGNOSIS — F411 Generalized anxiety disorder: Secondary | ICD-10-CM | POA: Diagnosis not present

## 2022-07-02 DIAGNOSIS — F341 Dysthymic disorder: Secondary | ICD-10-CM

## 2022-07-02 MED ORDER — BUPROPION HCL ER (XL) 150 MG PO TB24: 150.0000 mg | ORAL_TABLET | Freq: Every day | ORAL | 2 refills | Status: DC

## 2022-07-02 MED ORDER — DULOXETINE HCL 30 MG PO CPEP: 90.0000 mg | ORAL_CAPSULE | Freq: Every day | ORAL | 2 refills | Status: DC

## 2022-07-07 ENCOUNTER — Ambulatory Visit (INDEPENDENT_AMBULATORY_CARE_PROVIDER_SITE_OTHER): Payer: Medicaid Other | Admitting: Clinical

## 2022-07-07 DIAGNOSIS — F341 Dysthymic disorder: Secondary | ICD-10-CM
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
## 2022-07-09 ENCOUNTER — Telehealth: Payer: Self-pay | Admitting: Orthopaedic Surgery

## 2022-07-09 ENCOUNTER — Encounter: Payer: Self-pay | Admitting: Bariatrics

## 2022-07-09 ENCOUNTER — Ambulatory Visit (INDEPENDENT_AMBULATORY_CARE_PROVIDER_SITE_OTHER): Payer: Medicaid Other | Admitting: Bariatrics

## 2022-07-09 ENCOUNTER — Other Ambulatory Visit: Payer: Self-pay | Admitting: Physician Assistant

## 2022-07-09 VITALS — BP 112/71 | HR 69 | Temp 98.5°F | Ht 68.0 in | Wt 268.0 lb

## 2022-07-09 DIAGNOSIS — F32A Depression, unspecified: Secondary | ICD-10-CM | POA: Insufficient documentation

## 2022-07-09 DIAGNOSIS — Z6841 Body Mass Index (BMI) 40.0 and over, adult: Secondary | ICD-10-CM | POA: Diagnosis not present

## 2022-07-09 DIAGNOSIS — E669 Obesity, unspecified: Secondary | ICD-10-CM

## 2022-07-09 DIAGNOSIS — Z7985 Long-term (current) use of injectable non-insulin antidiabetic drugs: Secondary | ICD-10-CM

## 2022-07-09 DIAGNOSIS — E1169 Type 2 diabetes mellitus with other specified complication: Secondary | ICD-10-CM

## 2022-07-09 DIAGNOSIS — F3289 Other specified depressive episodes: Secondary | ICD-10-CM | POA: Diagnosis not present

## 2022-07-09 MED ORDER — TRULICITY 3 MG/0.5ML ~~LOC~~ SOAJ: 3.0000 mg | SUBCUTANEOUS | 0 refills | Status: DC

## 2022-07-09 MED ORDER — TOPIRAMATE 50 MG PO TABS: 50.0000 mg | ORAL_TABLET | Freq: Two times a day (BID) | ORAL | 0 refills | Status: DC

## 2022-07-09 MED ORDER — TRAMADOL HCL 50 MG PO TABS: 50.0000 mg | ORAL_TABLET | Freq: Two times a day (BID) | ORAL | 2 refills | Status: DC | PRN

## 2022-07-09 NOTE — Telephone Encounter (Signed)
Pt called requesting a refill of tramadol. Pt states pharmacy sent in a few request. Please send to pharmacy on file. Pt phone number is [phone].

## 2022-07-09 NOTE — Telephone Encounter (Signed)
Sent!

## 2022-07-10 ENCOUNTER — Encounter: Payer: Self-pay | Admitting: Internal Medicine

## 2022-07-10 ENCOUNTER — Ambulatory Visit: Payer: Medicaid Other | Attending: Internal Medicine | Admitting: Internal Medicine

## 2022-07-10 DIAGNOSIS — M1990 Unspecified osteoarthritis, unspecified site: Secondary | ICD-10-CM | POA: Insufficient documentation

## 2022-07-10 DIAGNOSIS — Z6841 Body Mass Index (BMI) 40.0 and over, adult: Secondary | ICD-10-CM | POA: Insufficient documentation

## 2022-07-10 DIAGNOSIS — E039 Hypothyroidism, unspecified: Secondary | ICD-10-CM

## 2022-07-10 DIAGNOSIS — Z79899 Other long term (current) drug therapy: Secondary | ICD-10-CM | POA: Insufficient documentation

## 2022-07-10 DIAGNOSIS — E669 Obesity, unspecified: Secondary | ICD-10-CM | POA: Diagnosis not present

## 2022-07-10 DIAGNOSIS — Z23 Encounter for immunization: Secondary | ICD-10-CM | POA: Diagnosis not present

## 2022-07-10 DIAGNOSIS — G4733 Obstructive sleep apnea (adult) (pediatric): Secondary | ICD-10-CM | POA: Diagnosis not present

## 2022-07-10 DIAGNOSIS — I10 Essential (primary) hypertension: Secondary | ICD-10-CM | POA: Insufficient documentation

## 2022-07-10 DIAGNOSIS — E119 Type 2 diabetes mellitus without complications: Secondary | ICD-10-CM | POA: Diagnosis present

## 2022-07-10 DIAGNOSIS — D367 Benign neoplasm of other specified sites: Secondary | ICD-10-CM | POA: Insufficient documentation

## 2022-07-10 DIAGNOSIS — R053 Chronic cough: Secondary | ICD-10-CM | POA: Diagnosis not present

## 2022-07-10 DIAGNOSIS — Z7984 Long term (current) use of oral hypoglycemic drugs: Secondary | ICD-10-CM | POA: Insufficient documentation

## 2022-07-10 DIAGNOSIS — K219 Gastro-esophageal reflux disease without esophagitis: Secondary | ICD-10-CM | POA: Insufficient documentation

## 2022-07-10 DIAGNOSIS — Z7985 Long-term (current) use of injectable non-insulin antidiabetic drugs: Secondary | ICD-10-CM | POA: Insufficient documentation

## 2022-07-10 DIAGNOSIS — M797 Fibromyalgia: Secondary | ICD-10-CM | POA: Insufficient documentation

## 2022-07-10 DIAGNOSIS — F32A Depression, unspecified: Secondary | ICD-10-CM | POA: Insufficient documentation

## 2022-07-10 DIAGNOSIS — R0981 Nasal congestion: Secondary | ICD-10-CM

## 2022-07-10 DIAGNOSIS — E785 Hyperlipidemia, unspecified: Secondary | ICD-10-CM | POA: Diagnosis not present

## 2022-07-10 DIAGNOSIS — Z1389 Encounter for screening for other disorder: Secondary | ICD-10-CM | POA: Diagnosis not present

## 2022-07-10 DIAGNOSIS — E1169 Type 2 diabetes mellitus with other specified complication: Secondary | ICD-10-CM | POA: Diagnosis not present

## 2022-07-10 DIAGNOSIS — E1159 Type 2 diabetes mellitus with other circulatory complications: Secondary | ICD-10-CM

## 2022-07-10 DIAGNOSIS — I152 Hypertension secondary to endocrine disorders: Secondary | ICD-10-CM

## 2022-07-10 DIAGNOSIS — F341 Dysthymic disorder: Secondary | ICD-10-CM

## 2022-07-10 LAB — POCT GLYCOSYLATED HEMOGLOBIN (HGB A1C): HbA1c, POC (controlled diabetic range): 6.3 % (ref 0.0–7.0)

## 2022-07-10 MED ORDER — FEXOFENADINE HCL 180 MG PO TABS: 180.0000 mg | ORAL_TABLET | Freq: Every day | ORAL | 0 refills | Status: AC

## 2022-07-10 MED ORDER — BENZONATATE 200 MG PO CAPS: 200.0000 mg | ORAL_CAPSULE | Freq: Two times a day (BID) | ORAL | 0 refills | Status: DC | PRN

## 2022-07-10 NOTE — Progress Notes (Signed)
Pt states she wants to discuss Rx's w/ pcp

## 2022-07-10 NOTE — Progress Notes (Signed)
Patient ID: Robin [name], female    DOB: 06-Nov-1955  MRN: [id number]  CC: Hypertension and Diabetes   Subjective: Robin [name] is a 66 y.o. female who presents for chronic ds management Her concerns today include:  Patient with history of HTN, DM, OSA, fibromyalgia, depression, obesity,  GERD, hypothyroid, OA of multiple js (hands, knees, shoulders, hips), pituitary adenoma status post resection  Obesity/DM:   A1C 6.3% down 42 lbs since started MWM program 05/2021. On Trulicity 3 mg, metformin 1000 mg in the morning and 500 mg in the evening and topamax Checks BS daily before BF.  Range 86-105  HTN:  checks BP daily.  Reports level has fluctuated. Has log with her.  Some of her most recent readings were: 124/81, 119/75, 124/80, 124/72. Limits salt in her foods. Reports compliance with taking HCTZ 25 mg, Cozaar 25 mg, Spiro 25 mg, Norvasc 10 mg, Hydralazine 50 mg BID, Metoprolol 75 mg BID.  Lasix PRN.  HL:  taking and tolerating Lipitor 40 mg  Hypothyroid:  taking Levothyroxine as prescribed.  OSA:  CPAP was removed from her home by [facility] 3 mths ago.  She was having problems with the machine.  Thought it was mask.  Had mask desensitization. This did not correct the problem.  Pt felt air was too much for her. Pressure was turned down but was still waking up every morning feeling dried out in her throat and nose.  Because she was not using it enough, the CPAP was taken back.  According to patient, Dr. Allyson Sabal was suppose to arrange for [facility] to return machine to her.  According to his note, his plan was to get her back in with Dr. Tresa Endo.  He made no mention of calling [facility].  Complains of nasal congestion and feeling like the left ear is stopped up.  The ear aches at times.  She has had a cough that is sometimes productive of mucus that makes her throat sore.  Symptoms have been going on for a month.  She is taking Singulair and using Astelin and Flonase nasal sprays.   Allegra is on her med list but she is not taking that.  Depression: Followed by [facility].  HM:  due for flu shot and eye exam Patient Active Problem List   Diagnosis Date Noted   Depression 07/09/2022   Class 3 severe obesity with serious comorbidity and body mass index (BMI) of 45.0 to 49.9 in adult [facility]) 07/09/2022   Major depressive disorder, recurrent episode (HCC) 07/02/2022   Generalized anxiety disorder 07/02/2022   Body mass index 40.0-44.9, adult (HCC) 02/25/2022   Morbid obesity (HCC) 02/25/2022   Right knee pain 02/18/2022   Diabetes mellitus type 2 in obese (HCC) 02/18/2022   Lumbar foraminal stenosis 01/23/2022   Lumbar radiculopathy 01/23/2022   Pain in both knees 01/23/2022   Vitamin D deficiency 11/18/2021   Polyp of descending colon 02/15/2020   Pituitary tumor 08/21/2019   Primary osteoarthritis of both hips 08/21/2019   Primary osteoarthritis of both knees 08/21/2019   Pain in left shoulder 08/21/2019   Morbid obesity with BMI of 60.0-69.9, adult (HCC) 07/17/2019   Pedal edema 07/17/2019   Multilevel degenerative disc disease 10/31/2018   Abdominal pain 10/28/2018   Change in bowel function 10/28/2018   Gastritis and gastroduodenitis 04/26/2018   Atrophic vaginitis 04/01/2018   Diabetes mellitus (HCC) 03/31/2018   Cough, persistent 02/08/2018   Dysphagia 12/14/2017   Generalized OA 07/30/2017  Urinary incontinence 07/23/2017   Early satiety 06/16/2017   Hx of iron deficiency anemia 05/28/2017   Throat pain in adult 04/05/2017   Prediabetes 02/04/2017   Dyspnea on exertion 12/23/2016   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 12/23/2016   Hemoptysis 12/23/2016   Fibromyalgia 08/18/2016   Chronic lymphocytic thyroiditis 08/18/2016   Persistent depressive disorder 04/01/2016   OSA (obstructive sleep apnea) 04/01/2016   Essential hypertension 12/09/2015   Hypothyroidism 12/09/2015   Morbid obesity  due to excess calories ([place]) 12/09/2015   Constipation 05/27/2015   Fatty liver 05/27/2015   Abdominal pain, chronic, epigastric 07/04/2012   Anxiety 06/09/2012   History of gastroesophageal reflux (GERD) 06/09/2012   Hyperlipidemia 06/09/2012   Refusal of blood transfusions as patient is Jehovah's Witness 06/09/2012   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      albuterol (VENTOLIN HFA) 108 (90 Base) MCG/ACT inhaler INHALE 1-2 PUFFS BY MOUTH into THE lungs EVERY 6 HOURS AS NEEDED FOR SHORTNESS OF BREATH OR wheezing 18 g 2   amLODipine (NORVASC) 10 MG tablet Take 1 tablet by mouth daily.     aspirin EC 81 MG tablet Take 81 mg by mouth at bedtime.     atorvastatin (LIPITOR) 40 MG tablet TAKE ONE TABLET BY MOUTH EVERY EVENING 90 tablet 2   azelastine (ASTELIN) 0.1 % nasal spray 1-2 sprays per nostril 2 times daily as needed 30 mL 12   buPROPion (WELLBUTRIN XL) 150 MG 24 hr tablet Take 1 tablet (150 mg total) by mouth daily. 30 tablet 2   CALCIUM CARBONATE ANTACID PO Take 2 tablets by mouth See admin instructions. Take 2 tablets by mouth every morning, may also take 2 tablets at night as needed for acid reflux     celecoxib (CELEBREX) 100 MG capsule TAKE ONE CAPSULE BY MOUTH TWICE DAILY 180 capsule 1   CINNAMON PO Take 1 capsule by mouth daily.     Dulaglutide (TRULICITY) 3 TD/1.7OH SOPN Inject 3 mg as directed once a week. 2 mL 0   DULoxetine (CYMBALTA) 30 MG capsule Take 3 capsules (90 mg total) by mouth daily. 90 capsule 2   esomeprazole (NEXIUM) 40 MG capsule TAKE ONE CAPSULE BY MOUTH EVERY MORNING and TAKE ONE CAPSULE BY MOUTH EVERY EVENING 60 capsule 0    fluticasone (FLONASE) 50 MCG/ACT nasal spray Place 1 spray into both nostrils daily. 16 g 1   gabapentin (NEURONTIN) 300 MG capsule TAKE ONE CAPSULE BY MOUTH EVERY EVENING (with $RemoveBefor'600mg'[id number]$  tab FOR $Remov'900mg'QpQtHQ$  DOSE) 30 capsule 6   gabapentin (NEURONTIN) 600 MG tablet TAKE ONE TABLET BY MOUTH EVERY MORNING and TAKE ONE TABLET BY MOUTH AT NOON and TAKE ONE TABLET EVERY EVENING 270 tablet 1   glucose blood test strip USE AS INSTRUCTED     glucose blood test strip USE AS INSTRUCTED     hydrALAZINE (APRESOLINE) 50 MG tablet TAKE ONE TABLET BY MOUTH EVERY MORNING and TAKE ONE TABLET BY MOUTH EVERY EVENING 180 tablet 0   hydrochlorothiazide (HYDRODIURIL) 25 MG tablet TAKE ONE TABLET BY MOUTH EVERY MORNING 90 tablet 1   levothyroxine (SYNTHROID) 88 MCG tablet TAKE  ONE TABLET BY MOUTH BEFORE BREAKFAST 90 tablet 3   levothyroxine (SYNTHROID) 88 MCG tablet Take 1 tablet by mouth daily.     linaclotide (LINZESS) 290 MCG CAPS capsule TAKE ONE CAPSULE BY MOUTH BEFORE BREAKFAST 90 capsule 3   losartan (COZAAR) 25 MG tablet TAKE ONE TABLET BY MOUTH EVERY EVENING 90 tablet 0   meloxicam (MOBIC) 15 MG tablet      metFORMIN (GLUCOPHAGE) 500 MG tablet TAKE TWO TABLETS BY MOUTH EVERY MORNING and TAKE ONE TABLET EVERY EVENING 270 tablet 2   Metoprolol Tartrate 75 MG TABS TAKE ONE TABLET BY MOUTH EVERY MORNING and TAKE ONE TABLET BY MOUTH EVERY EVENING 180 tablet 2   montelukast (SINGULAIR) 10 MG tablet TAKE ONE TABLET BY MOUTH EVERY EVENING 90 tablet 2   Multiple Vitamin (MULTIVITAMIN WITH MINERALS) TABS tablet Take 1 tablet by mouth daily.     NARCAN 4 MG/0.1ML LIQD nasal spray kit      NEOMYCIN-POLYMYXIN-HYDROCORTISONE (CORTISPORIN) 1 % SOLN OTIC solution Place 3 drops into the left ear 4 (four) times daily. 10 mL 0   Omega-3 Fatty Acids (FISH OIL) 1000 MG CAPS Take 1,000 mg by mouth at bedtime.     Sodium Chloride-Sodium Bicarb (NETI POT SINUS WASH) 2300-700 MG KIT DO nasal rinse twice a week as needed 1 kit 0   spironolactone  (ALDACTONE) 25 MG tablet Take 1 tablet (25 mg total) by mouth every morning. 90 tablet 2   tiZANidine (ZANAFLEX) 2 MG tablet Take by mouth.     topiramate (TOPAMAX) 50 MG tablet Take 1 tablet (50 mg total) by mouth 2 (two) times daily. 60 tablet 0   traMADol (ULTRAM) 50 MG tablet Take 1 tablet (50 mg total) by mouth every 12 (twelve) hours as needed. 30 tablet 2   vitamin B-12 (CYANOCOBALAMIN) 1000 MCG tablet Take 1,000 mcg by mouth daily.     Vitamin D, Ergocalciferol, (DRISDOL) 1.25 MG (50000 UNIT) CAPS capsule Take 1 capsule (50,000 Units total) by mouth once a week. 12 capsule 0   [DISCONTINUED] amitriptyline (ELAVIL) 50 MG tablet TAKE 1 TABLET BY MOUTH AT BEDTIME. 90 tablet 0   No current facility-administered medications on file prior to visit.    Allergies  Allergen Reactions   Amlodipine Besylate Other (See Comments)    Edema in lower extremities Other reaction(s): Other Edema in lower extremities   Celexa [Citalopram Hydrobromide] Other (See Comments)    "Made me feel out of it"   Gabapentin Other (See Comments)    Contributed to lower extremity edema   Diflucan [Fluconazole] Rash    Social History   Socioeconomic History   Marital status: Married    Spouse name: Not on file   Number of children: 4   Years of education: 10   Highest education level: Not on file  Occupational History   Occupation: Disabled  Tobacco Use   Smoking status: Former    Packs/day: 0.50    Years: 10.00    Total pack years: 5.00    Types: Cigarettes    Start date: 07/14/1975    Quit date: 04/04/1993    Years since quitting: 29.2   Smokeless tobacco: Never   Tobacco comments:    quit 20 yrs ago  Vaping Use   Vaping Use: Never used  Substance and Sexual Activity   Alcohol use: No    Alcohol/week: 0.0 standard drinks of alcohol   Drug use: No   Sexual activity: Yes  Other Topics Concern  Not on file  Social History Narrative   Lives with husband in an apartment on the first floor.   Has 4 children.     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  Trying to get disability.  Formerly worked as a Teacher, early years/pre.  Education: 11th grade.       Pulmonary (12/23/16):   Originally from [facility]. She was raised in [place]. 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 No pets currently. No bird exposure. She does have mold in her current home in the bathroom, laundry room, & master bedroom.    Social Determinants of Health   Financial Resource Strain: Low Risk  (10/15/2020)   Overall Financial Resource Strain (CARDIA)    Difficulty of Paying Living Expenses: Not very hard  Food Insecurity: No Food Insecurity (10/15/2020)   Hunger Vital Sign    Worried About Running Out of Food in the Last Year: Never true    Ran Out of Food in the Last Year: Never true  Transportation Needs: Unmet Transportation Needs (10/15/2020)   PRAPARE - Hydrologist (Medical): Yes    Lack of Transportation (Non-Medical): No  Physical Activity: Inactive (10/15/2020)   Exercise Vital Sign    Days of Exercise per Week: 0 days    Minutes of Exercise per Session: 0 min  Stress: Stress Concern Present (10/15/2020)   [place]    Feeling of Stress : To some extent  Social Connections: Socially Integrated (08/14/2020)   Social Connection and Isolation Panel [NHANES]    Frequency of Communication with Friends and Family: More than three times a week    Frequency of Social Gatherings with Friends and Family: More than three times a week    Attends Religious Services: More than 4 times per year    Active Member of [employer] or Organizations: Yes    Attends Music therapist: More than 4 times per year    Marital Status: Married  Human resources officer Violence: Not At  Risk (10/15/2020)   Humiliation, Afraid, Rape, and Kick questionnaire    Fear of Current or Ex-Partner: No    Emotionally Abused: No    Physically Abused: No    Sexually Abused: No    Family History  Problem Relation Age of Onset   Diabetes Mother    Hypertension Mother    Heart Problems Mother    Hyperlipidemia Mother    Asthma Mother    Sleep apnea Mother    Obesity Mother    Colon polyps Mother    Diabetes Father    Kidney cancer Father    Hypertension Father    Hyperlipidemia Father    Sleep apnea Father    Hypertension Sister    Allergic rhinitis Sister    Hyperlipidemia Sister    Liver disease Brother    Arthritis Brother    Hypertension Brother    Hyperlipidemia Brother    Hypertension Brother    Hyperlipidemia Brother    Breast cancer Maternal Grandmother    Kidney disease Maternal Grandfather    Alcoholism Maternal Grandfather    Breast cancer Paternal Grandmother    Hypertension Daughter    Hyperlipidemia Daughter    Eczema Daughter    Lupus Maternal Aunt  Stomach cancer Other        aunt   Hypertension Son    Colon cancer Neg Hx    Immunodeficiency Neg Hx    Urticaria Neg Hx     Past Surgical History:  Procedure Laterality Date   ABDOMINAL HYSTERECTOMY  02/18/2000   CARDIAC CATHETERIZATION  08/18/2003   normal L main/LAD/L Cfx/RCA (Dr. Adora Fridge)   COLONOSCOPY  2006   Dr. [name], hyperplastic polyps   COLONOSCOPY  08/06/2011   abnormal terminal ileum for 10cm, erosions, geographical ulceration. Bx small bowel mucosa with prominent intramucosal lymphoid aggregates, slightly inflammed   COLONOSCOPY WITH PROPOFOL N/A 12/28/2019   Rourk: 4 sessile serrated adenomas removed on the colon.  Next colonoscopy in 3 years.   DIRECT LARYNGOSCOPY  03/15/2012   Procedure: DIRECT LARYNGOSCOPY;  Surgeon: [name], MD;  Location: [place];  Service: ENT;  Laterality: N/A; Dr. Wolicki--:>no foreign body seen. normal esophagus to 40cm   ESOPHAGEAL DILATION   04/17/2015   Procedure: ESOPHAGEAL DILATION;  Surgeon: [name], MD;  [facility];  Service: Endoscopy;;   ESOPHAGOGASTRODUODENOSCOPY  08/23/2008   RUE:AVWU distal esophageal erosions consistent with mild erosive reflux esophagitis, otherwise unremarkable esophagus/ Tiny antral erosions of doubtful clinical significance, otherwise normal stomach, patent pylorus, normal D1 and D2   ESOPHAGOGASTRODUODENOSCOPY  08/06/2011   small hh, noncritical Schatzki's ring s/p 26 F   ESOPHAGOGASTRODUODENOSCOPY N/A 04/17/2015   [id number] s/p dilation   ESOPHAGOGASTRODUODENOSCOPY (EGD) WITH PROPOFOL N/A 01/20/2018   Dr. [name]: Erosive gastropathy, normal-appearing esophagus status post empiric dilation.  Chronic gastritis, no H. pylori.   ESOPHAGOSCOPY  03/15/2012   Procedure: ESOPHAGOSCOPY;  Surgeon: [name], MD;  Location: [place];  Service: ENT;  Laterality: N/A;   KNEE ARTHROSCOPY Left 04/27/2001   MALONEY DILATION N/A 01/20/2018   Procedure: Venia Minks DILATION;  Surgeon: [name], MD;  [facility];  Service: Endoscopy;  Laterality: N/A;   NM MYOCAR PERF WALL MOTION  2003   negative bruce protocol exercise tress test; EF 68%; intermediate risk study due to evidence of anterior wall ischemia extending from mid-ventricle to apex   PITUITARY SURGERY  06/2012   benign tumor, surgeon at [facility]   POLYPECTOMY  12/28/2019   Procedure: POLYPECTOMY;  Surgeon: [name], MD;  [facility];  Service: Endoscopy;;   RECTOCELE REPAIR     TRANSTHORACIC ECHOCARDIOGRAM  2003   EF normal   VAGINAL PROLAPSE REPAIR     VIDEO BRONCHOSCOPY Bilateral 03/08/2017   Procedure: VIDEO BRONCHOSCOPY WITHOUT FLUORO;  Surgeon: Javier Glazier, MD;  Location: [place];  Service: Cardiopulmonary;  Laterality: Bilateral;    ROS: Review of Systems Negative except as stated above  PHYSICAL EXAM: BP 117/74   Pulse 60   Ht $R'5\' 8"'av$  (1.727 m)   Wt 268 lb (121.6 kg)    SpO2 100%   BMI 40.75 kg/m   Wt Readings from Last 3 Encounters:  07/10/22 268 lb (121.6 kg)  07/09/22 268 lb (121.6 kg)  06/03/22 272 lb (123.4 kg)    Physical Exam  General appearance - alert, well appearing, obese female sitting in motorized wheelchair.  And in no distress Mental status - normal mood, behavior, speech, dress, motor activity, and thought processes Ears - bilateral TM's and external ear canals normal Nose - normal and patent, no erythema, discharge or polyps.  Nasal mucosa appears dry. Mouth - mucous membranes moist, pharynx normal without lesions has a lot of  mucus at the back of the throat. Neck - supple, no significant adenopathy Chest - clear to auscultation, no wheezes, rales or rhonchi, symmetric air entry Heart - normal rate, regular rhythm, normal S1, S2, no murmurs, rubs, clicks or gallops Extremities - peripheral pulses normal, no pedal edema, no clubbing or cyanosis  Results for orders placed or performed in visit on 07/10/22  POCT glycosylated hemoglobin (Hb A1C)  Result Value Ref Range   Hemoglobin A1C     HbA1c POC (<> result, manual entry)     HbA1c, POC (prediabetic range)     HbA1c, POC (controlled diabetic range) 6.3 0.0 - 7.0 %   *Note: Due to a large number of results and/or encounters for the requested time period, some results have not been displayed. A complete set of results can be found in Results Review.       07/10/2022    9:29 AM 03/10/2022    9:07 AM 11/10/2021   11:04 AM  Depression screen PHQ 2/9  Decreased Interest 3 3 2   Down, Depressed, Hopeless 3 2 2   PHQ - 2 Score 6 5 4   Altered sleeping 2 1 2   Tired, decreased energy 2 2 2   Change in appetite 2 2 1   Feeling bad or failure about yourself  0 0 1  Trouble concentrating 1 1 1   Moving slowly or fidgety/restless 1 1 1   Suicidal thoughts 0 0 0  PHQ-9 Score 14 12 12   Difficult doing work/chores  Somewhat difficult        Latest Ref Rng & Units 11/17/2021    9:41 AM  01/04/2021    3:55 PM 07/27/2020   12:40 PM  CMP  Glucose 70 - 99 mg/dL 82  133  176   BUN 8 - 27 mg/dL 11  10  18    Creatinine 0.57 - 1.00 mg/dL 0.90  0.74  0.91   Sodium 134 - 144 mmol/L 143  137  139   Potassium 3.5 - 5.2 mmol/L 4.4  3.5  3.6   Chloride 96 - 106 mmol/L 103  100  98   CO2 20 - 29 mmol/L 26  29  28    Calcium 8.7 - 10.3 mg/dL 9.9  9.6  10.4   Total Protein 6.0 - 8.5 g/dL 7.4  7.9  7.8   Total Bilirubin 0.0 - 1.2 mg/dL 0.4  0.6  1.1   Alkaline Phos 44 - 121 IU/L 70  53  71   AST 0 - 40 IU/L 20  32  45   ALT 0 - 32 IU/L 20  35  56    Lipid Panel     Component Value Date/Time   CHOL 121 06/03/2021 0833   TRIG 73 06/03/2021 0833   HDL 52 06/03/2021 0833   CHOLHDL 2.3 06/03/2021 0833   CHOLHDL 2.3 12/16/2015 0950   VLDL 13 12/16/2015 0950   LDLCALC 54 06/03/2021 0833    CBC    Component Value Date/Time   WBC 12.0 (H) 01/04/2021 1555   RBC 4.34 01/04/2021 1555   HGB 12.8 01/04/2021 1555   HGB 12.0 01/20/2017 1001   HCT 41.0 01/04/2021 1555   HCT 38.2 01/20/2017 1001   HCT 41 06/25/2011 1053   PLT 411 (H) 01/04/2021 1555   PLT 356 01/20/2017 1001   MCV 94.5 01/04/2021 1555   MCV 90 01/20/2017 1001   MCV 91.1 06/25/2011 1053   MCH 29.5 01/04/2021 1555   MCHC 31.2 01/04/2021 1555  RDW 13.0 01/04/2021 1555   RDW 13.1 01/20/2017 1001   LYMPHSABS 4.6 (H) 01/04/2021 1555   LYMPHSABS 3.4 (H) 01/20/2017 1001   MONOABS 1.6 (H) 01/04/2021 1555   EOSABS 0.1 01/04/2021 1555   EOSABS 0.1 01/20/2017 1001   BASOSABS 0.1 01/04/2021 1555   BASOSABS 0.0 01/20/2017 1001    ASSESSMENT AND PLAN: 1. Type 2 diabetes mellitus with morbid obesity (HCC) At goal. Commended her on weight loss. She will continue her current medications including Trulicity and metformin. - POCT glycosylated hemoglobin (Hb A1C) - CBC - Ambulatory referral to Ophthalmology  2. Hypertension associated with diabetes ([place]) At goal. Stop furosemide.  Continue other medications listed above.   Once systolic blood pressure is consistently below 060 and diastolic consistently below 80, I think we can then start cutting back on some of her blood pressure medications  3. Hyperlipidemia associated with type 2 diabetes mellitus (HCC) Continue atorvastatin. - Lipid panel  4. Hypothyroidism, unspecified type Continue levothyroxine. - TSH  5. Cough, persistent I think she has a component of postnasal drip.  Lungs are clear today without wheezes. Recommend that she takes the [place].  I have given some Tessalon Perles to use as needed as well. - benzonatate (TESSALON) 200 MG capsule; Take 1 capsule (200 mg total) by mouth 2 (two) times daily as needed for cough.  Dispense: 20 capsule; Refill: 0  6. Sinus congestion - fexofenadine (ALLEGRA) 180 MG tablet; Take 1 tablet (180 mg total) by mouth daily.  Dispense: 30 tablet; Refill: 0  7. Persistent depressive disorder Plugged in with behavioral health.  8. OSA (obstructive sleep apnea) We will get her back in with Dr. Claiborne Billings. - Ambulatory referral to Cardiology  9.  Need for influenza vaccine Given today.  Patient was given the opportunity to ask questions.  Patient verbalized understanding of the plan and was able to repeat key elements of the plan.   This documentation was completed using Radio producer.  Any transcriptional errors are unintentional.  Orders Placed This Encounter  Procedures   Lipid panel   CBC   TSH   Ambulatory referral to Ophthalmology   Ambulatory referral to Cardiology   POCT glycosylated hemoglobin (Hb A1C)     Requested Prescriptions   Signed Prescriptions Disp Refills   benzonatate (TESSALON) 200 MG capsule 20 capsule 0    Sig: Take 1 capsule (200 mg total) by mouth 2 (two) times daily as needed for cough.   fexofenadine (ALLEGRA) 180 MG tablet 30 tablet 0    Sig: Take 1 tablet (180 mg total) by mouth daily.    Return in about 4 months (around 11/10/2022).  Karle Plumber, MD, FACP

## 2022-07-11 LAB — CBC
Hematocrit: 38.7 % (ref 34.0–46.6)
Hemoglobin: 12.4 g/dL (ref 11.1–15.9)
MCH: 29.1 pg (ref 26.6–33.0)
MCHC: 32 g/dL (ref 31.5–35.7)
MCV: 91 fL (ref 79–97)
Platelets: 365 10*3/uL (ref 150–450)
RBC: 4.26 x10E6/uL (ref 3.77–5.28)
RDW: 11.9 % (ref 11.7–15.4)
WBC: 7.7 10*3/uL (ref 3.4–10.8)

## 2022-07-11 LAB — LIPID PANEL
Chol/HDL Ratio: 2.2 ratio (ref 0.0–4.4)
Cholesterol, Total: 113 mg/dL (ref 100–199)
HDL: 51 mg/dL (ref >39)
LDL Chol Calc (NIH): 51 mg/dL (ref 0–99)
Triglycerides: 45 mg/dL (ref 0–149)
VLDL Cholesterol Cal: 11 mg/dL (ref 5–40)

## 2022-07-11 LAB — TSH: TSH: 0.801 u[IU]/mL (ref 0.450–4.500)

## 2022-07-11 NOTE — Plan of Care (Signed)
  Problem: Depression CCP Problem  1  Goal: STG: Robin [name] WILL PARTICIPATE IN AT LEAST 80% OF SCHEDULED INDIVIDUAL PSYCHOTHERAPY SESSIONS Outcome: Progressing Goal: STG: Robin [name] WILL IDENTIFY 3 COGNITIVE PATTERNS AND BELIEFS THAT SUPPORT DEPRESSION Outcome: Progressing   Problem: Depression CCP Problem  1  Goal: LTG: Robin [name] WILL SCORE LESS THAN 10 ON THE PATIENT HEALTH QUESTIONNAIRE (PHQ-9) Outcome: Not Progressing

## 2022-07-14 ENCOUNTER — Encounter: Payer: Self-pay | Admitting: Bariatrics

## 2022-07-14 ENCOUNTER — Other Ambulatory Visit (INDEPENDENT_AMBULATORY_CARE_PROVIDER_SITE_OTHER): Payer: Self-pay | Admitting: Bariatrics

## 2022-07-14 ENCOUNTER — Other Ambulatory Visit: Payer: Self-pay | Admitting: Gastroenterology

## 2022-07-14 DIAGNOSIS — F3289 Other specified depressive episodes: Secondary | ICD-10-CM

## 2022-07-14 NOTE — Progress Notes (Signed)
Chief Complaint:   OBESITY Robin [name] is here to discuss her progress with her obesity treatment plan along with follow-up of her obesity related diagnoses. [name] is on the Category 3 Plan and states she is following her eating plan approximately 50% of the time. [name] states she is doing chair exercise for 40 minutes 3-4 times per week.  Today's visit was #: 46 Starting weight: 310 lbs Starting date: 06/03/2021 Today's weight: 268 lbs Today's date: 07/09/2022 Total lbs lost to date: 42 Total lbs lost since last in-office visit: 4  Interim History: Robin [name] was not stable enough to get on the scale today, but she seems to have lost 4 pounds since her last visit.  She is taking it 1 day at a time.  Subjective:   1. Diabetes mellitus type 2 in obese [facility]) [name] is currently taking Trulicity.  2. Other depression, with emotional eating binges [name] notes decrease in appetite and cravings, and she denies side effects with Topamax.  Assessment/Plan:   1. Diabetes mellitus type 2 in obese [facility]) [name] will continue Trulicity 3 mg once weekly, and we will refill for 1 month.  Recipes I and Recipes II were given to the patient today.  - Dulaglutide (TRULICITY) 3 LG/9.2JJ SOPN; Inject 3 mg as directed once a week.  Dispense: 2 mL; Refill: 0  2. Other depression, with emotional eating binges [name] will continue Topamax 50 mg twice daily, and we will refill for 1 month.  - topiramate (TOPAMAX) 50 MG tablet; Take 1 tablet (50 mg total) by mouth 2 (two) times daily.  Dispense: 60 tablet; Refill: 0  3. Obesity, Current BMI 40.75 Robin [name] is currently in the action stage of change. As such, her goal is to continue with weight loss efforts. She has agreed to the Category 3 Plan.   We will follow the meal plan at least 80-90% of the time.  Decrease bad snacks and eat better snacks.  Keep water and protein intake high.  Exercise goals: As is.   Behavioral modification strategies:  increasing lean protein intake, decreasing simple carbohydrates, increasing vegetables, increasing water intake, decreasing eating out, no skipping meals, meal planning and cooking strategies, keeping healthy foods in the home, dealing with family or coworker sabotage, travel eating strategies, holiday eating strategies , and celebration eating strategies.  [name] has agreed to follow-up with our clinic in 4 weeks. She was informed of the importance of frequent follow-up visits to maximize her success with intensive lifestyle modifications for her multiple health conditions.   Objective:   Blood pressure 112/71, pulse 69, temperature 98.5 F (36.9 C), height '5\' 8"'$  (1.727 m), weight 268 lb (121.6 kg), SpO2 99 %. Body mass index is 40.75 kg/m.  Patient is in a electric wheelchair for ambulation. General: Cooperative, alert, well developed, in no acute distress. HEENT: Conjunctivae and lids unremarkable. Cardiovascular: Regular rhythm.  Lungs: Normal work of breathing. Neurologic: No focal deficits.   Lab Results  Component Value Date   CREATININE 0.90 11/17/2021   BUN 11 11/17/2021   NA 143 11/17/2021   K 4.4 11/17/2021   CL 103 11/17/2021   CO2 26 11/17/2021   Lab Results  Component Value Date   ALT 20 11/17/2021   AST 20 11/17/2021   ALKPHOS 70 11/17/2021   BILITOT 0.4 11/17/2021   Lab Results  Component Value Date   HGBA1C 6.3 07/10/2022   HGBA1C 5.3 03/10/2022   HGBA1C 5.7 (H) 11/17/2021   HGBA1C 5.7 11/10/2021  HGBA1C 6.1 (H) 06/03/2021   Lab Results  Component Value Date   INSULIN 28.2 (H) 11/17/2021   INSULIN 48.4 (H) 06/03/2021   Lab Results  Component Value Date   TSH 0.801 07/10/2022   Lab Results  Component Value Date   CHOL 113 07/10/2022   HDL 51 07/10/2022   LDLCALC 51 07/10/2022   TRIG 45 07/10/2022   CHOLHDL 2.2 07/10/2022   Lab Results  Component Value Date   VD25OH 61.7 06/03/2021   Lab Results  Component Value Date   WBC 7.7  07/10/2022   HGB 12.4 07/10/2022   HCT 38.7 07/10/2022   MCV 91 07/10/2022   PLT 365 07/10/2022   Lab Results  Component Value Date   IRON 31 03/04/2015   TIBC 340 03/04/2015   FERRITIN 34 03/04/2015   Attestation Statements:   Reviewed by clinician on day of visit: allergies, medications, problem list, medical history, surgical history, family history, social history, and previous encounter notes.   Wilhemena Durie, am acting as Location manager for [employer], DO.  I have reviewed the above documentation for accuracy and completeness, and I agree with the above. Jearld Lesch, DO

## 2022-07-15 ENCOUNTER — Other Ambulatory Visit: Payer: Self-pay | Admitting: Gastroenterology

## 2022-07-22 ENCOUNTER — Ambulatory Visit (INDEPENDENT_AMBULATORY_CARE_PROVIDER_SITE_OTHER): Payer: Medicaid Other | Admitting: Clinical

## 2022-07-22 DIAGNOSIS — F341 Dysthymic disorder: Secondary | ICD-10-CM | POA: Diagnosis not present
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
## 2022-07-25 NOTE — Plan of Care (Signed)
  Problem: Depression CCP Problem  1  Goal: LTG: Margee WILL SCORE LESS THAN 10 ON THE PATIENT HEALTH QUESTIONNAIRE (PHQ-9) Outcome: Progressing Goal: STG: Ernie WILL PARTICIPATE IN AT LEAST 80% OF SCHEDULED INDIVIDUAL PSYCHOTHERAPY SESSIONS Outcome: Progressing Goal: STG: Onnie WILL IDENTIFY 3 COGNITIVE PATTERNS AND BELIEFS THAT SUPPORT DEPRESSION Outcome: Progressing   

## 2022-07-30 ENCOUNTER — Other Ambulatory Visit: Payer: Self-pay | Admitting: Internal Medicine

## 2022-08-03 ENCOUNTER — Encounter: Payer: Self-pay | Admitting: Cardiovascular Disease

## 2022-08-03 ENCOUNTER — Ambulatory Visit: Payer: Medicaid Other | Attending: Cardiovascular Disease | Admitting: Cardiovascular Disease

## 2022-08-03 VITALS — BP 106/66 | HR 62 | Ht 68.0 in | Wt 273.0 lb

## 2022-08-03 DIAGNOSIS — K219 Gastro-esophageal reflux disease without esophagitis: Secondary | ICD-10-CM | POA: Insufficient documentation

## 2022-08-03 DIAGNOSIS — I10 Essential (primary) hypertension: Secondary | ICD-10-CM | POA: Diagnosis not present

## 2022-08-03 DIAGNOSIS — E039 Hypothyroidism, unspecified: Secondary | ICD-10-CM | POA: Diagnosis present

## 2022-08-03 DIAGNOSIS — E782 Mixed hyperlipidemia: Secondary | ICD-10-CM | POA: Diagnosis not present

## 2022-08-03 DIAGNOSIS — R7303 Prediabetes: Secondary | ICD-10-CM | POA: Diagnosis not present

## 2022-08-03 DIAGNOSIS — G4733 Obstructive sleep apnea (adult) (pediatric): Secondary | ICD-10-CM | POA: Diagnosis present

## 2022-08-03 NOTE — Patient Instructions (Signed)
Medication Instructions:  The current medical regimen is effective;  continue present plan and medications.  *If you need a refill on your cardiac medications before your next appointment, please call your pharmacy*   Testing/Procedures: Your physician has recommended that you have a sleep study. This test records several body functions during sleep, including: brain activity, eye movement, oxygen and carbon dioxide blood levels, heart rate and rhythm, breathing rate and rhythm, the flow of air through your mouth and nose, snoring, body muscle movements, and chest and belly movement.    Follow-Up: At [facility], you and your health needs are our priority.  As part of our continuing mission to provide you with exceptional heart care, we have created designated Provider Care Teams.  These Care Teams include your primary Cardiologist (physician) and Advanced Practice Providers (APPs -  Physician Assistants and Nurse Practitioners) who all work together to provide you with the care you need, when you need it.  We recommend signing up for the patient portal called "MyChart".  Sign up information is provided on this After Visit Summary.  MyChart is used to connect with patients for Virtual Visits (Telemedicine).  Patients are able to view lab/test results, encounter notes, upcoming appointments, etc.  Non-urgent messages can be sent to your provider as well.   To learn more about what you can do with MyChart, go to [email or web address].    Your next appointment:   3 month(s)  The format for your next appointment:   In Person  Provider:   Shelva Majestic, MD
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
## 2022-08-03 NOTE — Patient Instructions (Signed)
Thank you for attending your appointment today.  -- We did not make any medication changes today. Please continue medications as prescribed.  Please do not make any changes to medications without first discussing with your provider. If you are experiencing a psychiatric emergency, please call 911 or present to your nearest emergency department. Additional crisis, medication management, and therapy resources are included below.  [facility]  [address], [place], [place] [address] [phone] WALK-IN URGENT CARE 24/7 FOR ANYONE [address], [place], [place]  [phone] Fax: [phone] [email or web address] *Interpreters available *Accepts all insurance and uninsured for Urgent Care needs *Accepts Medicaid and uninsured for outpatient treatment (below)      ONLY FOR [place] Residents  Below:    Outpatient New Patient Assessment/Therapy Walk-ins:        Monday -Thursday 8am until slots are full.        Every Friday 1pm-4pm  (first come, first served)                   New Patient Psychiatry/Medication Management        Monday-Friday 8am-11am (first come, first served)               For all walk-ins we ask that you arrive by 7:15am, because patients will be seen in the order of arrival.   

## 2022-08-03 NOTE — Progress Notes (Signed)
Cardiology Office Note    Date:  08/08/2022   ID:  [name], DOB 10/16/1955, MRN [id number]  PCP:  Ladell Pier, MD  Cardiologist:  Shelva Majestic, MD (Sleep); Dr. Gwenlyn Found  New sleep consult referred by Dr. Quay Burow  History of Present Illness:  Robin [name] is a 66 y.o. female who is followed by Dr. Quay Burow for cardiology care and Dr. Karle Plumber for primary care at [facility].  Robin [name] has a history of hypertension, diabetes mellitus, previously diagnosed obstructive sleep apnea, fibromyalgia, depression, obesity, GERD, hypothyroidism, and is status post resection for prior pituitary adenoma.  Remotely, she had undergone cardiac catheterization by Dr. Alvester Chou in 2003 which revealed normal coronary arteries.  A subsequent coronary CTA performed in September 2019 showed a calcium score of 0.  She has been documented to have normal LV function by 2D echo Doppler study.    On December 08, 2019, Robin [name] underwent a diagnostic polysomnogram to evaluate for sleep apnea referred by Dr. Gwenlyn Found.  At the time she had symptoms of fatigue, snoring, witnessed apnea with gasping for breath during sleep, as well as her obesity.  She was found to have severe obstructive sleep apnea with an AHI of 50.8/h with absence of REM sleep.  She had moderate oxygen desaturation to a nadir of 83%.  There was loud snoring volume.  On January 20, 2020 she underwent a CPAP titration study where CPAP was initiated at 6 cm and titrated to 19 cm of water which was felt to be optimal with an AHI of 0.6, and O2 nadir of 89%.  Rem sleep was present for 22 minutes at 19 cm of water pressure.  Currently, Robin [name] had received a CPAP unit but apparently her machine was subsequently taken away by [employer] who had been her DME company.  She had never been evaluated by me or any sleep physician prior to her machine being taken away.  Presently, she continues to admit to sleeping  poorly.  She goes to bed between 11 PM and 8 AM.  She had seen Dr. Gwenlyn Found in June 2023 and a sleep evaluation was recommended.  An Epworth Sleepiness Scale score was calculated in the office today which endorsed at 5 arguing against significant residual daytime sleepiness.  She admits that she still wakes up gasping for breath during the night, is aware of loud snoring, has had witnessed apnea and admits to nocturia at least 3 times per night.  She presents for her initial sleep evaluation and consultation.   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   Anemia    Anxiety disorder    Arthritis    Asthma    Back pain    Chronic neck pain    Colon polyps    Constipation    Depression    Diabetes mellitus without complication (HCC)    Dyspnea    Edema of lower extremity    Fibromyalgia    GERD (gastroesophageal reflux disease)    Heartburn    Hiatal hernia    small   Hyperlipidemia    Hypertension    Hypothyroidism    Joint pain    Morbid obesity with BMI of 50.0-59.9, adult (HCC)    Osteoarthritis    Other fatigue    Schatzki's ring    non critical   Severe episode of recurrent major depressive  disorder, with psychotic features ([place]) 05/26/2018   Sinusitis    Sleep apnea    CPAP, Sleep study at [facility]   SOB (shortness of breath) on exertion    Status post dilation of esophageal narrowing    Swallowing difficulty     Past Surgical History:  Procedure Laterality Date   ABDOMINAL HYSTERECTOMY  02/18/2000   CARDIAC CATHETERIZATION  08/18/2003   normal L main/LAD/L Cfx/RCA (Dr. Adora Fridge)   COLONOSCOPY  2006   Dr. [name], hyperplastic polyps   COLONOSCOPY  08/06/2011   abnormal terminal ileum for 10cm, erosions, geographical ulceration. Bx small bowel mucosa with prominent intramucosal lymphoid aggregates, slightly inflammed   COLONOSCOPY WITH PROPOFOL N/A 12/28/2019   Rourk: 4  sessile serrated adenomas removed on the colon.  Next colonoscopy in 3 years.   DIRECT LARYNGOSCOPY  03/15/2012   Procedure: DIRECT LARYNGOSCOPY;  Surgeon: [name], MD;  Location: [facility];  Service: ENT;  Laterality: N/A; Dr. Wolicki--:>no foreign body seen. normal esophagus to 40cm   ESOPHAGEAL DILATION  04/17/2015   Procedure: ESOPHAGEAL DILATION;  Surgeon: [name], MD;  [facility];  Service: Endoscopy;;   ESOPHAGOGASTRODUODENOSCOPY  08/23/2008   KGU:RKYH distal esophageal erosions consistent with mild erosive reflux esophagitis, otherwise unremarkable esophagus/ Tiny antral erosions of doubtful clinical significance, otherwise normal stomach, patent pylorus, normal D1 and D2   ESOPHAGOGASTRODUODENOSCOPY  08/06/2011   small hh, noncritical Schatzki's ring s/p 41 F   ESOPHAGOGASTRODUODENOSCOPY N/A 04/17/2015   CWC:BJSEGB s/p dilation   ESOPHAGOGASTRODUODENOSCOPY (EGD) WITH PROPOFOL N/A 01/20/2018   Dr. [name]: Erosive gastropathy, normal-appearing esophagus status post empiric dilation.  Chronic gastritis, no H. pylori.   ESOPHAGOSCOPY  03/15/2012   Procedure: ESOPHAGOSCOPY;  Surgeon: [name], MD;  Location: [facility];  Service: ENT;  Laterality: N/A;   KNEE ARTHROSCOPY Left 04/27/2001   MALONEY DILATION N/A 01/20/2018   Procedure: Venia Minks DILATION;  Surgeon: [name], MD;  [facility];  Service: Endoscopy;  Laterality: N/A;   NM MYOCAR PERF WALL MOTION  2003   negative bruce protocol exercise tress test; EF 68%; intermediate risk study due to evidence of anterior wall ischemia extending from mid-ventricle to apex   PITUITARY SURGERY  06/2012   benign tumor, surgeon at [facility]   POLYPECTOMY  12/28/2019   Procedure: POLYPECTOMY;  Surgeon: [name], MD;  [facility];  Service: Endoscopy;;   RECTOCELE REPAIR     TRANSTHORACIC ECHOCARDIOGRAM  2003   EF normal   VAGINAL PROLAPSE REPAIR     VIDEO BRONCHOSCOPY Bilateral 03/08/2017    Procedure: VIDEO BRONCHOSCOPY WITHOUT FLUORO;  Surgeon: Javier Glazier, MD;  Location: [facility];  Service: Cardiopulmonary;  Laterality: Bilateral;    Current Medications: Outpatient Medications Prior to Visit  Medication Sig Dispense Refill   ACCU-CHEK AVIVA PLUS test strip USE AS DIRECTED 100 strip 3   acetaminophen (TYLENOL) 500 MG tablet Take 1,000 mg by mouth 2 (two) times daily.      amLODipine (NORVASC) 10 MG tablet Take 1 tablet by mouth daily.     aspirin EC 81 MG tablet Take 81 mg by mouth at bedtime.     atorvastatin (LIPITOR) 40 MG tablet TAKE ONE TABLET BY MOUTH EVERY EVENING 90 tablet 2   azelastine (ASTELIN) 0.1 % nasal spray 1-2 sprays per nostril 2 times daily as needed 30 mL 12   benzonatate (TESSALON) 200 MG capsule Take 1 capsule (200 mg total) by  mouth 2 (two) times daily as needed for cough. 20 capsule 0   CALCIUM CARBONATE ANTACID PO Take 2 tablets by mouth See admin instructions. Take 2 tablets by mouth every morning, may also take 2 tablets at night as needed for acid reflux     celecoxib (CELEBREX) 100 MG capsule TAKE ONE CAPSULE BY MOUTH TWICE DAILY 180 capsule 1   CINNAMON PO Take 1 capsule by mouth daily.     Dulaglutide (TRULICITY) 3 YT/0.1SW SOPN Inject 3 mg as directed once a week. 2 mL 0   esomeprazole (NEXIUM) 40 MG capsule TAKE ONE CAPSULE BY MOUTH EVERY MORNING and TAKE ONE CAPSULE BY MOUTH EVERY EVENING 60 capsule 0   fexofenadine (ALLEGRA) 180 MG tablet Take 1 tablet (180 mg total) by mouth daily. 30 tablet 0   fluticasone (FLONASE) 50 MCG/ACT nasal spray Place 1 spray into both nostrils daily. 16 g 1   gabapentin (NEURONTIN) 300 MG capsule TAKE ONE CAPSULE BY MOUTH EVERY EVENING (with 675m tab FOR 9051mDOSE) 30 capsule 6   gabapentin (NEURONTIN) 600 MG tablet TAKE ONE TABLET BY MOUTH EVERY MORNING and TAKE ONE TABLET BY MOUTH AT NOON and TAKE ONE TABLET EVERY EVENING 270 tablet 1   glucose blood test strip USE AS INSTRUCTED     glucose blood  test strip USE AS INSTRUCTED     hydrALAZINE (APRESOLINE) 50 MG tablet TAKE ONE TABLET BY MOUTH EVERY MORNING and TAKE ONE TABLET BY MOUTH EVERY EVENING 180 tablet 0   hydrochlorothiazide (HYDRODIURIL) 25 MG tablet TAKE ONE TABLET BY MOUTH EVERY MORNING 90 tablet 1   levothyroxine (SYNTHROID) 88 MCG tablet TAKE ONE TABLET BY MOUTH BEFORE BREAKFAST 90 tablet 3   levothyroxine (SYNTHROID) 88 MCG tablet Take 1 tablet by mouth daily.     linaclotide (LINZESS) 290 MCG CAPS capsule TAKE ONE CAPSULE BY MOUTH BEFORE BREAKFAST 90 capsule 3   losartan (COZAAR) 25 MG tablet TAKE ONE TABLET BY MOUTH EVERY EVENING 90 tablet 0   meloxicam (MOBIC) 15 MG tablet      montelukast (SINGULAIR) 10 MG tablet TAKE ONE TABLET BY MOUTH EVERY EVENING 90 tablet 2   Multiple Vitamin (MULTIVITAMIN WITH MINERALS) TABS tablet Take 1 tablet by mouth daily.     NARCAN 4 MG/0.1ML LIQD nasal spray kit      NEOMYCIN-POLYMYXIN-HYDROCORTISONE (CORTISPORIN) 1 % SOLN OTIC solution Place 3 drops into the left ear 4 (four) times daily. 10 mL 0   Omega-3 Fatty Acids (FISH OIL) 1000 MG CAPS Take 1,000 mg by mouth at bedtime.     Sodium Chloride-Sodium Bicarb (NETI POT SINUS WASH) 2300-700 MG KIT DO nasal rinse twice a week as needed 1 kit 0   tiZANidine (ZANAFLEX) 2 MG tablet Take by mouth.     topiramate (TOPAMAX) 50 MG tablet Take 1 tablet (50 mg total) by mouth 2 (two) times daily. 60 tablet 0   traMADol (ULTRAM) 50 MG tablet Take 1 tablet (50 mg total) by mouth every 12 (twelve) hours as needed. 30 tablet 2   VENTOLIN HFA 108 (90 Base) MCG/ACT inhaler INHALE 1-2 PUFFS BY MOUTH into THE lungs EVERY 6 HOURS AS NEEDED FOR WHEEZING AND/OR SHORTNESS OF BREATH 18 g 2   vitamin B-12 (CYANOCOBALAMIN) 1000 MCG tablet Take 1,000 mcg by mouth daily.     Vitamin D, Ergocalciferol, (DRISDOL) 1.25 MG (50000 UNIT) CAPS capsule Take 1 capsule (50,000 Units total) by mouth once a week. 12 capsule 0   buPROPion (WELLBUTRIN XL) 150  MG 24 hr tablet Take 1  tablet (150 mg total) by mouth daily. 30 tablet 2   DULoxetine (CYMBALTA) 30 MG capsule Take 3 capsules (90 mg total) by mouth daily. 90 capsule 2   metFORMIN (GLUCOPHAGE) 500 MG tablet TAKE TWO TABLETS BY MOUTH EVERY MORNING and TAKE ONE TABLET EVERY EVENING 270 tablet 2   Metoprolol Tartrate 75 MG TABS TAKE ONE TABLET BY MOUTH EVERY MORNING and TAKE ONE TABLET BY MOUTH EVERY EVENING 180 tablet 2   spironolactone (ALDACTONE) 25 MG tablet Take 1 tablet (25 mg total) by mouth every morning. 90 tablet 2   No facility-administered medications prior to visit.     Allergies:   Amlodipine besylate, Celexa [citalopram hydrobromide], Gabapentin, and Diflucan [fluconazole]   Social History   Socioeconomic History   Marital status: Married    Spouse name: Not on file   Number of children: 4   Years of education: 10   Highest education level: Not on file  Occupational History   Occupation: Disabled  Tobacco Use   Smoking status: Former    Packs/day: 0.50    Years: 10.00    Total pack years: 5.00    Types: Cigarettes    Start date: 07/14/1975    Quit date: 04/04/1993    Years since quitting: 29.3   Smokeless tobacco: Never   Tobacco comments:    quit 20 yrs ago  Vaping Use   Vaping Use: Never used  Substance and Sexual Activity   Alcohol use: No    Alcohol/week: 0.0 standard drinks of alcohol   Drug use: No   Sexual activity: Yes  Other Topics Concern   Not on file  Social History Narrative   Lives with husband in an apartment on the first floor.  Has 4 children.     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  Trying to get disability.  Formerly worked as a Teacher, early years/pre.  Education: 11th grade.      [facility] (12/23/16):   Originally from [facility] [place] She was raised in [place]. 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 No pets  currently. No bird exposure. She does have mold in her current home in the bathroom, laundry room, & master bedroom.    Social Determinants of Health   Financial Resource Strain: Low Risk  (10/15/2020)   Overall Financial Resource Strain (CARDIA)    Difficulty of Paying Living Expenses: Not very hard  Food Insecurity: No Food Insecurity (10/15/2020)   Hunger Vital Sign    Worried About Running Out of Food in the Last Year: Never true    Ran Out of Food in the Last Year: Never true  Transportation Needs: Unmet Transportation Needs (10/15/2020)   PRAPARE - Hydrologist (Medical): Yes    Lack of Transportation (Non-Medical): No  Physical Activity: Inactive (10/15/2020)   Exercise Vital Sign    Days of Exercise per Week: 0 days    Minutes of Exercise per Session: 0 min  Stress: Stress Concern Present (10/15/2020)   [place]    Feeling of Stress : To some extent  Social Connections: Socially Integrated (08/14/2020)   Social Connection and Isolation Panel [NHANES]    Frequency of Communication with Friends and Family: More  than three times a week    Frequency of Social Gatherings with Friends and Family: More than three times a week    Attends Religious Services: More than 4 times per year    Active Member of [employer] or Organizations: Yes    Attends Music therapist: More than 4 times per year    Marital Status: Married    Additional social history: She was born in [place].  Family History:  The patient's family history includes Alcoholism in her maternal grandfather; Allergic rhinitis in her sister; Arthritis in her brother; Asthma in her mother; Breast cancer in her maternal grandmother and paternal grandmother; Colon polyps in her mother; Diabetes in her father and mother; Eczema in her daughter; Heart Problems in her mother; Hyperlipidemia in her brother, brother, daughter,  father, mother, and sister; Hypertension in her brother, brother, daughter, father, mother, sister, and son; Kidney cancer in her father; Kidney disease in her maternal grandfather; Liver disease in her brother; Lupus in her maternal aunt; Obesity in her mother; Sleep apnea in her father and mother; Stomach cancer in an other family member.   ROS General: Negative; No fevers, chills, or night sweats;  HEENT: Negative; No changes in vision or hearing, sinus congestion, difficulty swallowing Pulmonary: Negative; No cough, wheezing, shortness of breath, hemoptysis Cardiovascular: Negative; No chest pain, presyncope, syncope, palpitations GI: Negative; No nausea, vomiting, diarrhea, or abdominal pain GU: Negative; No dysuria, hematuria, or difficulty voiding Musculoskeletal: Negative; no myalgias, joint pain, or weakness Hematologic/Oncology: Negative; no easy bruising, bleeding Endocrine: Negative; no heat/cold intolerance; no diabetes Neuro: Negative; no changes in balance, headaches Skin: Negative; No rashes or skin lesions Psychiatric: Negative; No behavioral problems, depression Sleep: Negative; No snoring, daytime sleepiness, hypersomnolence, bruxism, restless legs, hypnogognic hallucinations, no cataplexy Other comprehensive 14 point system review is negative.   PHYSICAL EXAM:   VS:  BP 106/66   Pulse 62   Ht _0  (1.727 m)   Wt 273 lb (123.8 kg)   SpO2 97%   BMI 41.51 kg/m     Repeat blood pressure by me was 106/64.  Wt Readings from Last 3 Encounters:  08/03/22 273 lb (123.8 kg)  07/10/22 268 lb (121.6 kg)  07/09/22 268 lb (121.6 kg)    General: Alert, oriented, no distress.  Morbid obesity Skin: normal turgor, no rashes, warm and dry HEENT: Normocephalic, atraumatic. Pupils equal round and reactive to light; sclera anicteric; extraocular muscles intact; Nose without nasal septal hypertrophy Mouth/Parynx benign; Mallinpatti scale 3 Neck: Thick neck; no JVD, no carotid  bruits; normal carotid upstroke Lungs: clear to ausculatation and percussion; no wheezing or rales Chest wall: without tenderness to palpitation Heart: PMI not displaced, RRR, s1 s2 normal, 1/6 systolic murmur, no diastolic murmur, no rubs, gallops, thrills, or heaves Abdomen: Central adiposity; soft, nontender; no hepatosplenomehaly, BS+; abdominal aorta nontender and not dilated by palpation. Back: no CVA tenderness Pulses 2+ Musculoskeletal: full range of motion, normal strength, no joint deformities Extremities: no clubbing cyanosis or edema, Homan's sign negative  Neurologic: grossly nonfocal; Cranial nerves grossly wnl Psychologic: Normal mood and affect   Studies/Labs Reviewed:   August 03, 2022 ECG (independently read by me): Sinus rhythm at 62 1st degree block  Recent Labs:    Latest Ref Rng & Units 11/17/2021    9:41 AM 01/04/2021    3:55 PM 07/27/2020   12:40 PM  BMP  Glucose 70 - 99 mg/dL 82  133  176   BUN 8 - 27  mg/dL _0 Creatinine 0.57 - 1.00 mg/dL 0.90  0.74  0.91   BUN/Creat Ratio 12 - 28 12     Sodium 134 - 144 mmol/L 143  137  139   Potassium 3.5 - 5.2 mmol/L 4.4  3.5  3.6   Chloride 96 - 106 mmol/L 103  100  98   CO2 20 - 29 mmol/L _1 Calcium 8.7 - 10.3 mg/dL 9.9  9.6  10.4         Latest Ref Rng & Units 11/17/2021    9:41 AM 01/04/2021    3:55 PM 07/27/2020   12:40 PM  Hepatic Function  Total Protein 6.0 - 8.5 g/dL 7.4  7.9  7.8   Albumin 3.8 - 4.8 g/dL 4.3  3.9  4.2   AST 0 - 40 IU/L 20  32  45   ALT 0 - 32 IU/L 20  35  56   Alk Phosphatase 44 - 121 IU/L 70  53  71   Total Bilirubin 0.0 - 1.2 mg/dL 0.4  0.6  1.1        Latest Ref Rng & Units 07/10/2022   10:36 AM 01/04/2021    3:55 PM 07/27/2020   12:40 PM  CBC  WBC 3.4 - 10.8 x10E3/uL 7.7  12.0  10.3   Hemoglobin 11.1 - 15.9 g/dL 12.4  12.8  12.8   Hematocrit 34.0 - 46.6 % 38.7  41.0  40.9   Platelets 150 - 450 x10E3/uL 365  411  354    Lab Results  Component Value Date    MCV 91 07/10/2022   MCV 94.5 01/04/2021   MCV 94.0 07/27/2020   Lab Results  Component Value Date   TSH 0.801 07/10/2022   Lab Results  Component Value Date   HGBA1C 6.3 07/10/2022     BNP    Component Value Date/Time   BNP 32.5 01/04/2021 1555   BNP 8.8 09/22/2016 1539    ProBNP    Component Value Date/Time   PROBNP 25.0 03/08/2018 1137     Lipid Panel     Component Value Date/Time   CHOL 113 07/10/2022 1036   TRIG 45 07/10/2022 1036   HDL 51 07/10/2022 1036   CHOLHDL 2.2 07/10/2022 1036   CHOLHDL 2.3 12/16/2015 0950   VLDL 13 12/16/2015 0950   LDLCALC 51 07/10/2022 1036   LABVLDL 11 07/10/2022 1036     RADIOLOGY: No results found.   Additional studies/ records that were reviewed today include:    Patient Name: Robin [name], Robin [name] Date: 12/08/2019 Gender: Female D.O.B: 1956/05/23 Age (years): 63 Referring Provider: Lorretta Harp Height (inches): 52 Interpreting Physician: Shelva Majestic MD, ABSM Weight (lbs): 355 RPSGT: Baxter Flattery BMI: 58 MRN: [id number] Neck Size: 18.00   CLINICAL INFORMATION Sleep Study Type: NPSG   Indication for sleep study: Fatigue, Obesity, Snoring, Witnesses Apnea / Gasping During Sleep   Epworth Sleepiness Score: 7   SLEEP STUDY TECHNIQUE As per the AASM Manual for the Scoring of Sleep and Associated Events v2.3 (April 2016) with a hypopnea requiring 4% desaturations.   The channels recorded and monitored were frontal, central and occipital EEG, electrooculogram (EOG), submentalis EMG (chin), nasal and oral airflow, thoracic and abdominal wall motion, anterior tibialis EMG, snore microphone, electrocardiogram, and pulse oximetry.   MEDICATIONS acetaminophen (TYLENOL) 500 MG tablet  Acetaminophen-Codeine 300-30 MG tablet  albuterol (VENTOLIN HFA) 108 (90 Base) MCG/ACT inhaler  alum hydroxide-mag trisilicate (GAVISCON) 06-23 MG CHEW chewable tablet  amitriptyline (ELAVIL) 50 MG tablet  amLODipine (NORVASC) 5  MG tablet  aspirin EC 81 MG tablet  atorvastatin (LIPITOR) 20 MG tablet  benzonatate (TESSALON) 100 MG capsule  buPROPion (WELLBUTRIN XL) 300 MG 24 hr tablet  calcium carbonate (TUMS - DOSED IN MG ELEMENTAL CALCIUM) 500 MG chewable tablet  celecoxib (CELEBREX) 100 MG capsule  cetirizine (ZYRTEC) 10 MG tablet  chlorpheniramine (CHLOR-TRIMETON) 4 MG tablet  Cholecalciferol 1.25 MG (50000 UT) capsule  CINNAMON PO  dexamethasone (DECADRON) 0.5 MG tablet  DEXILANT 60 MG capsule  diclofenac sodium (VOLTAREN) 1 % GEL  DULoxetine (CYMBALTA) 30 MG capsule  ergocalciferol (VITAMIN D2) 1.25 MG (50000 UT) capsule  famotidine (PEPCID) 20 MG tablet  fexofenadine (ALLEGRA) 180 MG tablet  fluticasone (FLONASE) 50 MCG/ACT nasal spray  furosemide (LASIX) 40 MG tablet  gabapentin (NEURONTIN) 300 MG capsule  glucose blood (ONETOUCH ULTRA) test strip  glucose blood test strip  glucose blood test strip  hydrALAZINE (APRESOLINE) 50 MG tablet  levothyroxine (SYNTHROID) 88 MCG tablet  LINZESS 290 MCG CAPS capsule  metFORMIN (GLUCOPHAGE) 500 MG tablet  Metoprolol Tartrate 75 MG TABS  montelukast (SINGULAIR) 10 MG tablet  Multiple Vitamin (MULTIVITAMIN WITH MINERALS) TABS tablet  nystatin (MYCOSTATIN/NYSTOP) powder  Omega-3 Fatty Acids (FISH OIL) 1000 MG CAPS  OVER THE COUNTER MEDICATION  oxyCODONE-acetaminophen (PERCOCET) 10-325 MG tablet  polyethylene glycol-electrolytes (TRILYTE) 420 g solution  predniSONE (DELTASONE) 10 MG tablet  PREMARIN vaginal cream  sodium chloride (OCEAN) 0.65 % SOLN nasal spray  spironolactone (ALDACTONE) 25 MG tablet  vitamin B-12 (CYANOCOBALAMIN) 1000 MCG tablet    Medications self-administered by patient taken the night of the study : N/A   SLEEP ARCHITECTURE The study was initiated at 10:49:42 PM and ended at 5:19:10 AM.   Sleep onset time was 46.4 minutes and the sleep efficiency was 66.1%%. The total sleep time was 257.6 minutes.   Stage REM latency was N/A  minutes.   The patient spent 9.9%% of the night in stage N1 sleep, 90.1%% in stage N2 sleep, 0.0%% in stage N3 and 0% in REM.   Alpha intrusion was absent.   Supine sleep was 46.82%.   RESPIRATORY PARAMETERS The overall apnea/hypopnea index (AHI) was 50.8 per hour. The respiraotry disturbance index (RDI) was 53.6/h.There were 7 total apneas, including 7 obstructive, 0 central and 0 mixed apneas. There were 211 hypopneas and 12 RERAs.   The AHI during Stage REM sleep was N/A per hour.   AHI while supine was 54.7 per hour.   The mean oxygen saturation was 93.5%. The minimum SpO2 during sleep was 83.0%.   Loud snoring was noted during this study.   CARDIAC DATA The 2 lead EKG demonstrated sinus rhythm. The mean heart rate was 70.8 beats per minute. Other EKG findings include: None.   LEG MOVEMENT DATA The total PLMS were 0 with a resulting PLMS index of 0.0. Associated arousal with leg movement index was 0.5 .   IMPRESSIONS - Severe obstructive sleep apnea occurred during this study (AHI 50.8/h; RDI 53.2/h) with absence of REM sleep. - No significant central sleep apnea occurred during this study (CAI = 0.0/h). - Moderate oxygen desaturation to a nadir of 83.0%. - Abnormal sleep architecture with absence of slow wave and REM sleep. - The patient snored with loud snoring volume. - No cardiac abnormalities were noted during this study. - Clinically significant periodic limb movements did not occur during sleep. No significant associated  arousals.   DIAGNOSIS - Obstructive Sleep Apnea (327.23 [G47.33 ICD-10]) - Nocturnal Hypoxemia (327.26 [G47.36 ICD-10])   RECOMMENDATIONS - Recommend expeditious therapeutic CPAP titration to determine optimal pressure required to alleviate sleep disordered breathing. - Effort should be made to optimize nasal and oropharyngeal patency. - Avoid alcohol, sedatives and other CNS depressants that may worsen sleep apnea and disrupt normal sleep  architecture. - Sleep hygiene should be reviewed to assess factors that may improve sleep quality. - Weight management (BMI 54) and regular exercise should be initiated or continued if appropriate.     CPAP TITRATION: 01/20/2020 CLINICAL INFORMATION The patient is referred for a CPAP titration to treat sleep apnea.   Date of NPSG:  12/08/2019:  AHI 50.8/h; RDI 53.6/h: absent REM sleep: O2 nadir 83%; loud snoring   SLEEP STUDY TECHNIQUE As per the AASM Manual for the Scoring of Sleep and Associated Events v2.3 (April 2016) with a hypopnea requiring 4% desaturations.   The channels recorded and monitored were frontal, central and occipital EEG, electrooculogram (EOG), submentalis EMG (chin), nasal and oral airflow, thoracic and abdominal wall motion, anterior tibialis EMG, snore microphone, electrocardiogram, and pulse oximetry. Continuous positive airway pressure (CPAP) was initiated at the beginning of the study and titrated to treat sleep-disordered breathing.   MEDICATIONS ACCU-CHEK AVIVA PLUS test strip  acetaminophen (TYLENOL) 500 MG tablet  albuterol (VENTOLIN HFA) 108 (90 Base) MCG/ACT inhaler  amitriptyline (ELAVIL) 50 MG tablet  aspirin EC 81 MG tablet  atorvastatin (LIPITOR) 20 MG tablet  buPROPion (WELLBUTRIN XL) 300 MG 24 hr tablet  calcium carbonate (TUMS - DOSED IN MG ELEMENTAL CALCIUM) 500 MG chewable tablet  celecoxib (CELEBREX) 100 MG capsule  cetirizine (ZYRTEC) 10 MG tablet  chlorpheniramine (CHLOR-TRIMETON) 4 MG tablet  Cholecalciferol 1.25 MG (50000 UT) capsule  CINNAMON PO  DEXILANT 60 MG capsule  diclofenac sodium (VOLTAREN) 1 % GEL  DULoxetine (CYMBALTA) 30 MG capsule  famotidine (PEPCID) 20 MG tablet  fluticasone (FLONASE) 50 MCG/ACT nasal spray  furosemide (LASIX) 40 MG tablet  gabapentin (NEURONTIN) 300 MG capsule  glucose blood (ONETOUCH ULTRA) test strip  glucose blood test strip  glucose blood test strip  hydrALAZINE (APRESOLINE) 50 MG tablet   levothyroxine (SYNTHROID) 88 MCG tablet  LINZESS 290 MCG CAPS capsule  metFORMIN (GLUCOPHAGE) 500 MG tablet  Metoprolol Tartrate 75 MG TABS  montelukast (SINGULAIR) 10 MG tablet  Multiple Vitamin (MULTIVITAMIN WITH MINERALS) TABS tablet  Omega-3 Fatty Acids (FISH OIL) 1000 MG CAPS  OVER THE COUNTER MEDICATION  oxyCODONE-acetaminophen (PERCOCET) 10-325 MG tablet  polyethylene glycol-electrolytes (TRILYTE) 420 g solution  predniSONE (DELTASONE) 10 MG tablet  PREMARIN vaginal cream  sodium chloride (OCEAN) 0.65 % SOLN nasal spray  spironolactone (ALDACTONE) 25 MG tablet  vitamin B-12 (CYANOCOBALAMIN) 1000 MCG tablet    Medications self-administered by patient taken the night of the study : LIPITOR, CELEBREX, ELAVIL, PEPCID, CHLORPHENIRAMINE, NEURONTIN, CYMBALTA, METOPROLOL TARTRATE, APRESOLINE, MEGA OMEGA FISH OIL SOFTGELS, PERCOCET-5, SINGULAIR   TECHNICIAN COMMENTS Comments added by technician: NO RESTROOM VISTED Comments added by scorer: N/A   RESPIRATORY PARAMETERS Optimal PAP Pressure (cm):  19        AHI at Optimal Pressure (/hr):            0.6 Overall Minimal O2 (%):         83.0     Supine % at Optimal Pressure (%):    76 Minimal O2 at Optimal Pressure (%): 89.0        SLEEP ARCHITECTURE The study  was initiated at 10:49:20 PM and ended at 4:42:22 AM.   Sleep onset time was 14.8 minutes and the sleep efficiency was 94.1%%. The total sleep time was 332.2 minutes.   The patient spent 2.1%% of the night in stage N1 sleep, 83.7%% in stage N2 sleep, 0.8%% in stage N3 and 13.4% in REM.Stage REM latency was 99.5 minutes   Wake after sleep onset was 6.0. Alpha intrusion was absent. Supine sleep was 48.76%.   CARDIAC DATA The 2 lead EKG demonstrated sinus rhythm. The mean heart rate was 62.3 beats per minute. Other EKG findings include: None.   LEG MOVEMENT DATA The total Periodic Limb Movements of Sleep (PLMS) were 0. The PLMS index was 0.0. A PLMS index of <15 is considered  normal in adults.   IMPRESSIONS - CPAP was initiated at 6 cm and was titrated to optimal PAP pressure at 19 cm of water. AHI 0.6/h; O2 nadir 89%. REM sleep was present for 22 minutes at this pressure. - Central sleep apnea was not noted during this titration (CAI = 0.4/h). - Moderate oxygen desaturations to a nadir of  83% at  14 cm of water. - Resolution of prior loud snoring with CPAP - No cardiac abnormalities were observed during this study. - Clinically significant periodic limb movements were not noted during this study. Arousals associated with PLMs were rare.   DIAGNOSIS - Obstructive Sleep Apnea (327.23 [G47.33 ICD-10])   RECOMMENDATIONS - Recommend an initial trial of CPAP therapy with EPR of 3 at 19 cm H2O with heated humidification.  A Medium size Fisher&Paykel Full Face Mask Simplus mask was used for the study. - Effort should be made to optimize nasal and oropharyngeal patency. - Avoid alcohol, sedatives and other CNS depressants that may worsen sleep apnea and disrupt normal sleep architecture. - Sleep hygiene should be reviewed to assess factors that may improve sleep quality. - Weight management and regular exercise should be initiated or continued. - Recommend a download in 30 days and sleep clinic evaluation after 4 weeks of therapy     ASSESSMENT:    1. OSA (obstructive sleep apnea)   2. Primary hypertension   3. Prediabetes   4. Mixed hyperlipidemia   5. Gastroesophageal reflux disease without esophagitis   6. Hypothyroidism, unspecified type     PLAN:  Robin [name] is a 66 year old female who has a history of morbid obesity, hypertension, diabetes mellitus, depression, fibromyalgia, GERD, hypothyroidism, and has a history of remote resection of a pituitary adenoma.  She has a history significant sleep disordered breathing and in 2021 underwent initial evaluation with a diagnostic polysomnogram and subsequent CPAP titration evaluation.  She was found to have  very severe obstructive sleep apnea on her PSG on December 08, 2019 which showed an overall AHI of 50.8 with absence of REM sleep.  Oxygen desaturated to a nadir of 83%.  On CPAP titration evaluation in April 2021 she required titration up to 19 cm of water and at that time AHI was 0.6 with O2 nadir at 89 and she was able to sleep for 22 minutes and REM sleep.  Unfortunately, during the [facility] pandemic, she apparently never had an evaluation of her sleep apnea following CPAP initiation and [employer], the DME company, apparently took back her machine without her ever being subsequently evaluated.  Over the last several years she admits that her sleep continues to be poor.  She snores loudly, has been awakening gasping for breath, has frequent nocturia, and has  had witnessed apnea.  I had a very long discussion with her today in the office.  I discussed normal sleep architecture and the disruptive sleep architecture associated with obstructive sleep apnea.  I reviewed in detail the effect on cardiovascular health if patients have untreated sleep apnea with particular reference to its effects on hypertension, nocturnal arrhythmias with increased sympathetic tone contributing to palpitations as well as increased atrial fibrillation risk.  I discussed its effects on insulin resistance contributing to increased glucose, GERD, as well as potential increased inflammation.  In addition we discussed nocturnal hypoxemia at contributing potentially to nocturnal ischemia both cardiac as well as cerebrovascular.  I also discussed the pathophysiology associated with frequent nocturia seen in patients with untreated sleep apnea and the benefit of therapy.  Presently, I will like to schedule her for a split-night reevaluation since 2-1/2 years have elapsed since her initial study.  Her weight today is 273 pounds and BMI 41.5.  She may ultimately require BiPAP therapy.  I discussed optimal sleep duration at 7 and 9 hours.  If we are not able  to obtain a split-night study, a home study will have to be scheduled.  I discussed with her the importance of meeting compliance standards and will need to see her prior to her 90-day window and preferably after the first month of treatment.    Medication Adjustments/Labs and Tests Ordered: Current medicines are reviewed at length with the patient today.  Concerns regarding medicines are outlined above.  Medication changes, Labs and Tests ordered today are listed in the Patient Instructions below. Patient Instructions  Medication Instructions:  The current medical regimen is effective;  continue present plan and medications.  *If you need a refill on your cardiac medications before your next appointment, please call your pharmacy*   Testing/Procedures: Your physician has recommended that you have a sleep study. This test records several body functions during sleep, including: brain activity, eye movement, oxygen and carbon dioxide blood levels, heart rate and rhythm, breathing rate and rhythm, the flow of air through your mouth and nose, snoring, body muscle movements, and chest and belly movement.    Follow-Up: At [facility], you and your health needs are our priority.  As part of our continuing mission to provide you with exceptional heart care, we have created designated Provider Care Teams.  These Care Teams include your primary Cardiologist (physician) and Advanced Practice Providers (APPs -  Physician Assistants and Nurse Practitioners) who all work together to provide you with the care you need, when you need it.  We recommend signing up for the patient portal called "MyChart".  Sign up information is provided on this After Visit Summary.  MyChart is used to connect with patients for Virtual Visits (Telemedicine).  Patients are able to view lab/test results, encounter notes, upcoming appointments, etc.  Non-urgent messages can be sent to your provider as well.   To learn more  about what you can do with MyChart, go to [email or web address].    Your next appointment:   3 month(s)  The format for your next appointment:   In Person  Provider:   Shelva Majestic, MD         Signed, Shelva Majestic, MD,FACC, ABSM Diplomate, American Board of Sleep Medicine  08/08/2022 12:06 PM    [facility] [address] [place], [place], [place]  [address] Phone: [phone]

## 2022-08-04 ENCOUNTER — Other Ambulatory Visit: Payer: Self-pay | Admitting: Internal Medicine

## 2022-08-04 ENCOUNTER — Ambulatory Visit (INDEPENDENT_AMBULATORY_CARE_PROVIDER_SITE_OTHER): Payer: Medicaid Other | Admitting: Psychiatry

## 2022-08-04 ENCOUNTER — Encounter (HOSPITAL_COMMUNITY): Payer: Self-pay | Admitting: Psychiatry

## 2022-08-04 DIAGNOSIS — I152 Hypertension secondary to endocrine disorders: Secondary | ICD-10-CM

## 2022-08-04 DIAGNOSIS — F339 Major depressive disorder, recurrent, unspecified: Secondary | ICD-10-CM

## 2022-08-04 DIAGNOSIS — F411 Generalized anxiety disorder: Secondary | ICD-10-CM

## 2022-08-04 DIAGNOSIS — F341 Dysthymic disorder: Secondary | ICD-10-CM | POA: Diagnosis not present

## 2022-08-04 DIAGNOSIS — E1169 Type 2 diabetes mellitus with other specified complication: Secondary | ICD-10-CM

## 2022-08-04 MED ORDER — DULOXETINE HCL 30 MG PO CPEP: 90.0000 mg | ORAL_CAPSULE | Freq: Every day | ORAL | 2 refills | Status: DC

## 2022-08-04 MED ORDER — BUPROPION HCL ER (XL) 150 MG PO TB24: 150.0000 mg | ORAL_TABLET | Freq: Every day | ORAL | 2 refills | Status: DC

## 2022-08-05 ENCOUNTER — Ambulatory Visit (HOSPITAL_COMMUNITY): Payer: Medicaid Other | Admitting: Clinical

## 2022-08-08 ENCOUNTER — Encounter: Payer: Self-pay | Admitting: Cardiovascular Disease

## 2022-08-10 ENCOUNTER — Other Ambulatory Visit: Payer: Self-pay | Admitting: Bariatrics

## 2022-08-10 DIAGNOSIS — F3289 Other specified depressive episodes: Secondary | ICD-10-CM

## 2022-08-11 ENCOUNTER — Ambulatory Visit: Payer: Medicaid Other | Admitting: Bariatrics

## 2022-08-12 ENCOUNTER — Other Ambulatory Visit: Payer: Self-pay | Admitting: Bariatrics

## 2022-08-12 DIAGNOSIS — F3289 Other specified depressive episodes: Secondary | ICD-10-CM

## 2022-08-12 DIAGNOSIS — E1169 Type 2 diabetes mellitus with other specified complication: Secondary | ICD-10-CM

## 2022-08-17 ENCOUNTER — Other Ambulatory Visit: Payer: Self-pay | Admitting: Physician Assistant

## 2022-08-22 ENCOUNTER — Other Ambulatory Visit: Payer: Self-pay | Admitting: Internal Medicine

## 2022-08-22 DIAGNOSIS — R7303 Prediabetes: Secondary | ICD-10-CM

## 2022-08-24 ENCOUNTER — Ambulatory Visit (INDEPENDENT_AMBULATORY_CARE_PROVIDER_SITE_OTHER): Payer: Medicaid Other | Admitting: Clinical

## 2022-08-24 DIAGNOSIS — F331 Major depressive disorder, recurrent, moderate: Secondary | ICD-10-CM

## 2022-08-24 NOTE — Telephone Encounter (Signed)
Requested Prescriptions  Pending Prescriptions Disp Refills   ACCU-CHEK AVIVA PLUS test strip [Pharmacy Med Name: Accu-Chek Aviva Plus test strips] 100 strip 0    Sig: USE AS DIRECTED     Endocrinology: Diabetes - Testing Supplies Passed - 08/22/2022  8:02 AM      Passed - Valid encounter within last 12 months    Recent Outpatient Visits           1 month ago Type 2 diabetes mellitus with morbid obesity ([place])   [place], Neoma Laming B, MD   5 months ago Type 2 diabetes mellitus with morbid obesity ([place])   [place], MD   6 months ago Mixed stress and urge urinary incontinence   [place], MD   9 months ago Type 2 diabetes mellitus with morbid obesity [facility])   [place], MD   1 year ago Need for shingles vaccine   [place], RPH-CPP       Future Appointments             In 2 months Wynetta Emery Dalbert Batman, MD [place]

## 2022-08-24 NOTE — Plan of Care (Signed)
  Problem: Depression CCP Problem  1  Goal: LTG: Shyra WILL SCORE LESS THAN 10 ON THE PATIENT HEALTH QUESTIONNAIRE (PHQ-9) Outcome: Progressing Goal: STG: [place] WILL PARTICIPATE IN AT LEAST 80% OF SCHEDULED INDIVIDUAL PSYCHOTHERAPY SESSIONS Outcome: Progressing Goal: STG: [place] WILL IDENTIFY 3 COGNITIVE PATTERNS AND BELIEFS THAT SUPPORT DEPRESSION Outcome: Progressing

## 2022-08-24 NOTE — Progress Notes (Signed)
   THERAPIST PROGRESS NOTE  Session Time: 40 minutes  Participation Level: Active  Behavioral Response: CasualAlertDepressed  Type of Therapy: Individual Therapy  Treatment Goals addressed: client will identify 3 cognitive patterns and beliefs that support depression  ProgressTowards Goals: Progressing  Interventions: CBT and Supportive  Summary:  Robin [name] is a 67 y.o. female who presents for the scheduled appointment oriented times five, appropriately dressed and friendly. Client denied hallucinations and delusions. Client reported on today she has been stressed. Client reported she has been worried about her youngest daughter,29, who lives in [place] with her husband and 2 children. Client reported she knew from before her daughter married him that he was not the ideal husband for her but she married anyway. Client reported over time her husband has not been taking care of the household financially and things have fallen on her daughter to do. Client reported the husbands parents moved to [place] to stay with them although her daughter did not want to do that. Client reported her daughter reminds her of herself taking her vows seriously as she has with her own husband, her father, who has not treated her well. Client reported she tries to talk to her daughter about it but her daughter doesn't want to hear what she has to say at times and will go without talking to her or her siblings. Client reported her youngest daaughter talks to her eldest daughter regularly. Client reported she has not even thought about herself while being concerned with her daughter. Client reported otherwise she will be home with her husband. Client reported a few of her children might come by to see her. Evidence of progress towards goal:  Client reported she is using positive coping skills at least 5 days per week using her family support.  Suicidal/Homicidal: Nowithout intent/plan  Therapist Response:  Therapist  began the appointment asking the client how she has been doing since last seen. Therapist used CBT to engage using active listening and positive emotional support. Therapist used CBT to engage giving the client time to discuss her stress related to her family and how it relates to her own source of depression. Therapist used CBT to normalize the clients emotional response. Therapist used CBT ask the client to identify her progress with frequency of use with coping skills with continued practice in her daily activity.    Therapist used CBT to assign she practice self care.    Plan: Return again in 3 weeks.  Diagnosis: major depressive disorder, recurrent episode, moderate  Collaboration of Care: Patient refused AEB none requested by the client at this time.  Patient/Guardian was advised Release of Information must be obtained prior to any record release in order to collaborate their care with an outside provider. Patient/Guardian was advised if they have not already done so to contact the registration department to sign all necessary forms in order for [name] to release information regarding their care.   Consent: Patient/Guardian gives verbal consent for treatment and assignment of benefits for services provided during this visit. Patient/Guardian expressed understanding and agreed to proceed.   [place], LCSW 08/24/2022

## 2022-09-01 ENCOUNTER — Other Ambulatory Visit: Payer: Self-pay | Admitting: Internal Medicine

## 2022-09-01 DIAGNOSIS — J309 Allergic rhinitis, unspecified: Secondary | ICD-10-CM

## 2022-09-07 ENCOUNTER — Ambulatory Visit (INDEPENDENT_AMBULATORY_CARE_PROVIDER_SITE_OTHER): Payer: Medicaid Other | Admitting: Bariatrics

## 2022-09-07 ENCOUNTER — Encounter: Payer: Self-pay | Admitting: Bariatrics

## 2022-09-07 ENCOUNTER — Ambulatory Visit (HOSPITAL_COMMUNITY): Payer: Medicaid Other | Admitting: Clinical

## 2022-09-07 VITALS — BP 124/58 | HR 58 | Temp 98.0°F | Ht 68.0 in | Wt 271.0 lb

## 2022-09-07 DIAGNOSIS — Z7985 Long-term (current) use of injectable non-insulin antidiabetic drugs: Secondary | ICD-10-CM

## 2022-09-07 DIAGNOSIS — F3289 Other specified depressive episodes: Secondary | ICD-10-CM | POA: Diagnosis not present

## 2022-09-07 DIAGNOSIS — E669 Obesity, unspecified: Secondary | ICD-10-CM | POA: Diagnosis not present

## 2022-09-07 DIAGNOSIS — Z6841 Body Mass Index (BMI) 40.0 and over, adult: Secondary | ICD-10-CM | POA: Diagnosis not present

## 2022-09-07 DIAGNOSIS — E1169 Type 2 diabetes mellitus with other specified complication: Secondary | ICD-10-CM

## 2022-09-07 MED ORDER — TOPIRAMATE 50 MG PO TABS: 50.0000 mg | ORAL_TABLET | Freq: Two times a day (BID) | ORAL | 0 refills | Status: DC

## 2022-09-07 MED ORDER — TRULICITY 4.5 MG/0.5ML ~~LOC~~ SOAJ: 4.5000 mg | SUBCUTANEOUS | 0 refills | Status: DC

## 2022-09-08 ENCOUNTER — Ambulatory Visit (INDEPENDENT_AMBULATORY_CARE_PROVIDER_SITE_OTHER): Payer: Medicaid Other | Admitting: Clinical

## 2022-09-08 ENCOUNTER — Other Ambulatory Visit: Payer: Self-pay | Admitting: Internal Medicine

## 2022-09-08 DIAGNOSIS — F341 Dysthymic disorder: Secondary | ICD-10-CM | POA: Diagnosis not present

## 2022-09-09 NOTE — Progress Notes (Signed)
   THERAPIST PROGRESS NOTE  Session Time: 45 minutes  Participation Level: Active  Behavioral Response: CasualAlertDepressed  Type of Therapy: Individual Therapy  Treatment Goals addressed: client will identify 3 cognitive patterns and beliefs that support depression  ProgressTowards Goals: Progressing  Interventions: CBT and Supportive  Summary:  Robin [name] is a 66 y.o. female who presents for the scheduled appointment oriented x 5, appropriately dressed, and friendly.  Client denies hallucinations delusions. Client reported on today things have been about the same but she did receive some good news.  Client reported to her surprise her youngest daughter who she was worried about in [place] has decided to separate from her husband.  Client reported her daughter has found another place in [place] and is doing all the steps to completely separate her husband things on her.  Client reported she still worries about her because she is not closer to home but she is happy that she decided to make a better choice for herself.  Client reported she was also surprised because her youngest daughter resembled similar in character traits as hers such as being too nice.  Client reported she is going to try to talk to her eldest daughter about flying out to [place] to check on her youngest daughter. Client reported otherwise she sits in the house and feels a mix of emotions living with her husband. Client reported she does not understand why he has chosen to cheat over the past 5 years instead of doing right by her. Client reported she proposed some years ago they can separate but he needs to provide her finances. Client reported he refused and knows he does not want to financially provide for her so staying is cheaper. Client reported her children keep her going. Client reported she is planning a reunion trip with just her and all the children/ grandchildren.  Evidence of progress towards goal:  client reported  1 positive utilization of coping skills which is reframing negative thoughts at least 4 days per week.  Suicidal/Homicidal: Nowithout intent/plan  Therapist Response:  Therapist began the appointment asking the client how she has been doing since last seen. Therapist used CBT to engage using active listening and positive emotional support. Therapist used CBT to ask the client open ended questions and family stressors. Therapist used CBT to engage and reinforce the clients use of reframing negative thoughts and creating emotional boundaries. Therapist used CBT ask the client to identify her progress with frequency of use with coping skills with continued practice in her daily activity.    Therapist assigned the client homework to practice self care.    Plan: Return again in 3 weeks.  Diagnosis: persistent depressive disorder  Collaboration of Care: Patient refused AEB none requested by the client.  Patient/Guardian was advised Release of Information must be obtained prior to any record release in order to collaborate their care with an outside provider. Patient/Guardian was advised if they have not already done so to contact the registration department to sign all necessary forms in order for [name] to release information regarding their care.   Consent: Patient/Guardian gives verbal consent for treatment and assignment of benefits for services provided during this visit. Patient/Guardian expressed understanding and agreed to proceed.   [place], LCSW 09/08/2022

## 2022-09-10 ENCOUNTER — Telehealth: Payer: Self-pay | Admitting: Orthopaedic Surgery

## 2022-09-10 NOTE — Telephone Encounter (Signed)
Patient called. She would like Tramadol called in for her pain. Her call back number is [phone]

## 2022-09-11 ENCOUNTER — Other Ambulatory Visit: Payer: Self-pay | Admitting: Physician Assistant

## 2022-09-11 MED ORDER — TRAMADOL HCL 50 MG PO TABS: 50.0000 mg | ORAL_TABLET | Freq: Two times a day (BID) | ORAL | 2 refills | Status: DC | PRN

## 2022-09-11 NOTE — Telephone Encounter (Signed)
sent 

## 2022-09-22 NOTE — Progress Notes (Signed)
Chief Complaint:   OBESITY Robin [name] is here to discuss her progress with her obesity treatment plan along with follow-up of her obesity related diagnoses. Robin [name] is on the Category 3 Plan and states she is following her eating plan approximately 40% of the time. Robin [name] states she is doing chair exercises for 30 minutes 3 times per week.  Today's visit was #: 38 Starting weight: 310 lbs Starting date: 06/03/2021 Today's weight: 271 lbs Today's date: 09/07/2022 Total lbs lost to date: 39 Total lbs lost since last in-office visit: 0  Interim History: Robin [name] is up 3 lbs since her last visit, which was over the holidays. She has been sick and not eating as much.   Subjective:   1. Diabetes mellitus type 2 in obese Robin [name] is taking Trulicity as directed. She denies nausea or vomiting.   2. Other depression, with emotional eating binges Robin [name] is taking Topamax.   Assessment/Plan:   1. Diabetes mellitus type 2 in obese [facility]) [place] agreed to increase Trulicity from 3 mg to 4.5 mg once weekly with no refills.   - Dulaglutide (TRULICITY) 4.5 YN/8.2NF SOPN; Inject 4.5 mg as directed once a week.  Dispense: 2 mL; Refill: 0  2. Other depression, with emotional eating binges Robin [name] will continue Topamax 50 mg BID, and we will refill for 1 month.   - topiramate (TOPAMAX) 50 MG tablet; Take 1 tablet (50 mg total) by mouth 2 (two) times daily.  Dispense: 60 tablet; Refill: 0  3. Obesity with a current BMI of 41.3 Robin [name] is currently in the action stage of change. As such, her goal is to continue with weight loss efforts. She has agreed to the Category 3 Plan.   She will keep her protein high and carbohydrates low, and keep her water intake high.   Exercise goals: As is.   Behavioral modification strategies: increasing lean protein intake, decreasing simple carbohydrates, increasing vegetables, increasing water intake, decreasing eating out, no skipping meals, meal  planning and cooking strategies, and keeping healthy foods in the home.  Robin [name] has agreed to follow-up with our clinic in 4 weeks. She was informed of the importance of frequent follow-up visits to maximize her success with intensive lifestyle modifications for her multiple health conditions.   Objective:   Blood pressure (!) 124/58, pulse (!) 58, temperature 98 F (36.7 C), height '5\' 8"'$  (1.727 m), weight 271 lb (122.9 kg), SpO2 96 %. Body mass index is 41.21 kg/m.  Patient is in an automatic wheelchair for ambulation.  General: Cooperative, alert, well developed, in no acute distress. HEENT: Conjunctivae and lids unremarkable. Cardiovascular: Regular rhythm.  Lungs: Normal work of breathing. Neurologic: No focal deficits.   Lab Results  Component Value Date   CREATININE 0.90 11/17/2021   BUN 11 11/17/2021   NA 143 11/17/2021   K 4.4 11/17/2021   CL 103 11/17/2021   CO2 26 11/17/2021   Lab Results  Component Value Date   ALT 20 11/17/2021   AST 20 11/17/2021   ALKPHOS 70 11/17/2021   BILITOT 0.4 11/17/2021   Lab Results  Component Value Date   HGBA1C 6.3 07/10/2022   HGBA1C 5.3 03/10/2022   HGBA1C 5.7 (H) 11/17/2021   HGBA1C 5.7 11/10/2021   HGBA1C 6.1 (H) 06/03/2021   Lab Results  Component Value Date   INSULIN 28.2 (H) 11/17/2021   INSULIN 48.4 (H) 06/03/2021   Lab Results  Component Value Date   TSH 0.801 07/10/2022   Lab Results  Component Value Date   CHOL 113 07/10/2022   HDL 51 07/10/2022   LDLCALC 51 07/10/2022   TRIG 45 07/10/2022   CHOLHDL 2.2 07/10/2022   Lab Results  Component Value Date   VD25OH 61.7 06/03/2021   Lab Results  Component Value Date   WBC 7.7 07/10/2022   HGB 12.4 07/10/2022   HCT 38.7 07/10/2022   MCV 91 07/10/2022   PLT 365 07/10/2022   Lab Results  Component Value Date   IRON 31 03/04/2015   TIBC 340 03/04/2015   FERRITIN 34 03/04/2015   Attestation Statements:   Reviewed by clinician on day of visit:  allergies, medications, problem list, medical history, surgical history, family history, social history, and previous encounter notes.   Wilhemena Durie, am acting as Location manager for [employer], DO.  I have reviewed the above documentation for accuracy and completeness, and I agree with the above. Jearld Lesch, DO

## 2022-09-23 ENCOUNTER — Encounter: Payer: Self-pay | Admitting: Bariatrics

## 2022-09-30 ENCOUNTER — Ambulatory Visit (HOSPITAL_COMMUNITY): Payer: Medicaid Other | Admitting: Clinical

## 2022-09-30 ENCOUNTER — Telehealth (HOSPITAL_COMMUNITY): Payer: Self-pay | Admitting: Clinical

## 2022-09-30 NOTE — Patient Instructions (Signed)
Thank you for attending your appointment today.  -- We did not make any medication changes today. Please continue medications as prescribed.  Please do not make any changes to medications without first discussing with your provider. If you are experiencing a psychiatric emergency, please call 911 or present to your nearest emergency department. Additional crisis, medication management, and therapy resources are included below.  [facility]  [address], [place], [place] [address] [phone] WALK-IN URGENT CARE 24/7 FOR ANYONE [address], , [place]  [phone] Fax: [phone] [email or web address] *Interpreters available *Accepts all insurance and uninsured for Urgent Care needs *Accepts Medicaid and uninsured for outpatient treatment (below)      ONLY FOR [place] Residents  Below:    Outpatient New Patient Assessment/Therapy Walk-ins:        Monday -Thursday 8am until slots are full.        Every Friday 1pm-4pm  (first come, first served)                   New Patient Psychiatry/Medication Management        Monday-Friday 8am-11am (first come, first served)               For all walk-ins we ask that you arrive by 7:15am, because patients will be seen in the order of arrival.   
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
## 2022-09-30 NOTE — Telephone Encounter (Signed)
Therapist made contact with the client via telephone because she ddi not check in using the virtual link. Client reported she meant to cancel the appointment and reschedule. Client reported she is doing fine.

## 2022-10-01 ENCOUNTER — Telehealth (INDEPENDENT_AMBULATORY_CARE_PROVIDER_SITE_OTHER): Payer: Medicaid Other | Admitting: Psychiatry

## 2022-10-01 ENCOUNTER — Encounter (HOSPITAL_COMMUNITY): Payer: Self-pay | Admitting: Psychiatry

## 2022-10-01 ENCOUNTER — Encounter (HOSPITAL_COMMUNITY): Payer: Self-pay

## 2022-10-01 DIAGNOSIS — F339 Major depressive disorder, recurrent, unspecified: Secondary | ICD-10-CM | POA: Diagnosis not present

## 2022-10-01 DIAGNOSIS — F341 Dysthymic disorder: Secondary | ICD-10-CM | POA: Diagnosis not present

## 2022-10-01 DIAGNOSIS — F411 Generalized anxiety disorder: Secondary | ICD-10-CM

## 2022-10-01 MED ORDER — BUPROPION HCL ER (XL) 150 MG PO TB24: 150.0000 mg | ORAL_TABLET | Freq: Every day | ORAL | 2 refills | Status: DC

## 2022-10-01 MED ORDER — DULOXETINE HCL 30 MG PO CPEP: 90.0000 mg | ORAL_CAPSULE | Freq: Every day | ORAL | 2 refills | Status: DC

## 2022-10-06 ENCOUNTER — Encounter: Payer: Self-pay | Admitting: Bariatrics

## 2022-10-06 ENCOUNTER — Ambulatory Visit (INDEPENDENT_AMBULATORY_CARE_PROVIDER_SITE_OTHER): Payer: Medicaid Other | Admitting: Bariatrics

## 2022-10-06 VITALS — BP 128/78 | HR 62 | Temp 97.6°F | Ht 68.0 in | Wt 272.0 lb

## 2022-10-06 DIAGNOSIS — E7849 Other hyperlipidemia: Secondary | ICD-10-CM | POA: Diagnosis not present

## 2022-10-06 DIAGNOSIS — E1169 Type 2 diabetes mellitus with other specified complication: Secondary | ICD-10-CM | POA: Diagnosis not present

## 2022-10-06 DIAGNOSIS — E538 Deficiency of other specified B group vitamins: Secondary | ICD-10-CM | POA: Diagnosis not present

## 2022-10-06 DIAGNOSIS — Z7984 Long term (current) use of oral hypoglycemic drugs: Secondary | ICD-10-CM

## 2022-10-06 DIAGNOSIS — E559 Vitamin D deficiency, unspecified: Secondary | ICD-10-CM | POA: Diagnosis not present

## 2022-10-06 DIAGNOSIS — Z7985 Long-term (current) use of injectable non-insulin antidiabetic drugs: Secondary | ICD-10-CM

## 2022-10-06 DIAGNOSIS — E669 Obesity, unspecified: Secondary | ICD-10-CM

## 2022-10-06 DIAGNOSIS — Z6841 Body Mass Index (BMI) 40.0 and over, adult: Secondary | ICD-10-CM

## 2022-10-06 MED ORDER — TRULICITY 4.5 MG/0.5ML ~~LOC~~ SOAJ: 4.5000 mg | SUBCUTANEOUS | 0 refills | Status: DC

## 2022-10-07 ENCOUNTER — Encounter: Payer: Self-pay | Admitting: Podiatry

## 2022-10-07 ENCOUNTER — Ambulatory Visit (INDEPENDENT_AMBULATORY_CARE_PROVIDER_SITE_OTHER): Payer: Medicaid Other | Admitting: Podiatry

## 2022-10-07 VITALS — BP 113/60

## 2022-10-07 DIAGNOSIS — E1142 Type 2 diabetes mellitus with diabetic polyneuropathy: Secondary | ICD-10-CM | POA: Diagnosis not present

## 2022-10-07 DIAGNOSIS — M79675 Pain in left toe(s): Secondary | ICD-10-CM | POA: Diagnosis not present

## 2022-10-07 DIAGNOSIS — B351 Tinea unguium: Secondary | ICD-10-CM

## 2022-10-07 DIAGNOSIS — M79674 Pain in right toe(s): Secondary | ICD-10-CM | POA: Diagnosis not present

## 2022-10-07 NOTE — Progress Notes (Signed)
  Subjective:  Patient ID: Robin [name], female    DOB: Apr 02, 1956,  MRN: [id number]  Robin [name] presents to clinic today for at risk foot care with history of diabetic neuropathy and painful thick toenails that are difficult to trim. Pain interferes with ambulation. Aggravating factors include wearing enclosed shoe gear. Pain is relieved with periodic professional debridement.  Chief Complaint  Patient presents with   Nail Problem    [facility] BS-94 A1C-6.5 PCP-Deborah Johnson PCP VST-07/2022   New problem(s): None.   PCP is Ladell Pier, MD.  Allergies  Allergen Reactions   Amlodipine Besylate Other (See Comments)    Edema in lower extremities Other reaction(s): Other Edema in lower extremities   Celexa [Citalopram Hydrobromide] Other (See Comments)    "Made me feel out of it"   Gabapentin Other (See Comments)    Contributed to lower extremity edema   Diflucan [Fluconazole] Rash    Review of Systems: Negative except as noted in the HPI.  Objective: No changes noted in today's physical examination. Vitals:   10/07/22 0919  BP: 113/60   Robin [name] is a pleasant 67 y.o. female morbidly obese in NAD. AAO x 3.  Vascular Examination: Capillary refill time to digits immediate b/l. Palpable DP pulse(s) b/l LE. Palpable PT pulse(s) b/l LE. Pedal hair sparse. No pain with calf compression b/l. Lower extremity skin temperature gradient within normal limits. Trace edema noted BLE. No cyanosis or clubbing noted b/l LE.  Dermatological Examination: Pedal skin is warm and supple b/l LE. No open wounds b/l LE. No interdigital macerations noted b/l LE.   Toenails 1-5 b/l elongated, discolored, dystrophic, thickened, crumbly with subungual debris and tenderness to dorsal palpation.  Neurological Examination: Pt has subjective symptoms of neuropathy. Protective sensation intact 5/5 intact bilaterally with 10g monofilament b/l. Vibratory sensation intact b/l.  Musculoskeletal  Examination: Muscle strength 5/5 to all lower extremity muscle groups bilaterally. No pain, crepitus or joint limitation noted with ROM bilateral LE. Utilizes motorized chair for mobility assistance.  Assessment/Plan: 1. Pain due to onychomycosis of toenails of both feet   2. Diabetic peripheral neuropathy associated with type 2 diabetes mellitus ([place])     No orders of the defined types were placed in this encounter.  -Patient was evaluated and treated. All patient's and/or POA's questions/concerns answered on today's visit. -Continue foot and shoe inspections daily. Monitor blood glucose per PCP/Endocrinologist's recommendations. -Continue supportive shoe gear daily. -Mycotic toenails 1-5 bilaterally were debrided in length and girth with sterile nail nippers and dremel without incident. -Patient/POA to call should there be question/concern in the interim.   Return in about 3 months (around 01/06/2023).  Marzetta Board, DPM

## 2022-10-08 ENCOUNTER — Other Ambulatory Visit: Payer: Self-pay | Admitting: Bariatrics

## 2022-10-08 DIAGNOSIS — F3289 Other specified depressive episodes: Secondary | ICD-10-CM

## 2022-10-12 ENCOUNTER — Telehealth (INDEPENDENT_AMBULATORY_CARE_PROVIDER_SITE_OTHER): Payer: Self-pay | Admitting: Bariatrics

## 2022-10-12 ENCOUNTER — Other Ambulatory Visit: Payer: Self-pay | Admitting: Gastroenterology

## 2022-10-12 NOTE — Telephone Encounter (Signed)
1/8 Pharmacy calling because medication TOPIRAMATE was denied and she wanted to know if it was because the patient needed and refill first or if it was another reason.  [phone] Lucy

## 2022-10-13 ENCOUNTER — Other Ambulatory Visit (INDEPENDENT_AMBULATORY_CARE_PROVIDER_SITE_OTHER): Payer: Self-pay | Admitting: Bariatrics

## 2022-10-13 DIAGNOSIS — F3289 Other specified depressive episodes: Secondary | ICD-10-CM

## 2022-10-13 MED ORDER — TOPIRAMATE 50 MG PO TABS: 50.0000 mg | ORAL_TABLET | Freq: Two times a day (BID) | ORAL | 0 refills | Status: DC

## 2022-10-14 LAB — COMPREHENSIVE METABOLIC PANEL
ALT: 16 IU/L (ref 0–32)
AST: 21 IU/L (ref 0–40)
Albumin/Globulin Ratio: 1.5 (ref 1.2–2.2)
Albumin: 4.9 g/dL (ref 3.9–4.9)
Alkaline Phosphatase: 74 IU/L (ref 44–121)
BUN/Creatinine Ratio: 17 (ref 12–28)
BUN: 16 mg/dL (ref 8–27)
Bilirubin Total: 0.4 mg/dL (ref 0.0–1.2)
CO2: 22 mmol/L (ref 20–29)
Calcium: 10.4 mg/dL — ABNORMAL HIGH (ref 8.7–10.3)
Creatinine, Ser: 0.96 mg/dL (ref 0.57–1.00)
Globulin, Total: 3.3 g/dL (ref 1.5–4.5)
Glucose: 102 mg/dL — ABNORMAL HIGH (ref 70–99)
Total Protein: 8.2 g/dL (ref 6.0–8.5)
eGFR: 66 mL/min/{1.73_m2} (ref >59)

## 2022-10-14 LAB — LIPID PANEL WITH LDL/HDL RATIO
Cholesterol, Total: 140 mg/dL (ref 100–199)
HDL: 58 mg/dL (ref >39)
LDL Chol Calc (NIH): 68 mg/dL (ref 0–99)
LDL/HDL Ratio: 1.2 ratio (ref 0.0–3.2)
Triglycerides: 71 mg/dL (ref 0–149)
VLDL Cholesterol Cal: 14 mg/dL (ref 5–40)

## 2022-10-14 LAB — MICROALBUMIN / CREATININE URINE RATIO
Creatinine, Urine: 68.3 mg/dL
Microalb/Creat Ratio: <4 mg/g creat (ref 0–29)
Microalbumin, Urine: <3 ug/mL

## 2022-10-14 LAB — HEMOGLOBIN A1C
Est. average glucose Bld gHb Est-mCnc: 105 mg/dL
Hgb A1c MFr Bld: 5.3 % (ref 4.8–5.6)

## 2022-10-14 LAB — VITAMIN D 25 HYDROXY (VIT D DEFICIENCY, FRACTURES): Vit D, 25-Hydroxy: 37.7 ng/mL (ref 30.0–100.0)

## 2022-10-14 LAB — VITAMIN B12: Vitamin B-12: 1447 pg/mL — ABNORMAL HIGH (ref 232–1245)

## 2022-10-19 NOTE — Progress Notes (Unsigned)
Chief Complaint:   OBESITY Robin [name] is here to discuss her progress with her obesity treatment plan along with follow-up of her obesity related diagnoses. Robin [name] is on the Category 3 Plan and states she is following her eating plan approximately 50% of the time. Robin [name] states she is doing chair exercises for 30-45 minutes 3-4 times per week.  Today's visit was #: 35 Starting weight: 310 lbs Starting date: 06/03/2021 Today's weight: 272 lbs Today's date: 10/06/22 Total lbs lost to date: 38 Total lbs lost since last in-office visit: +1  Interim History: She is up 1 pound since her last visit.  Subjective:   1. Diabetes mellitus type 2 in obese ([place]) Taking Trulicity and metformin.  2. Other hyperlipidemia Taking Lipitor.  3. Vitamin D deficiency Not taking vitamin D. Minimal sun exposure.   4. B12 deficiency Taking B12   Assessment/Plan:   1. Diabetes mellitus type 2 in obese ([place]) 1.  Check A1c and urine microalbumin. 2.  Continue metformin. 3.- Dulaglutide (TRULICITY) 4.5 WN/0.2VO SOPN; Inject 4.5 mg as directed once a week.  Dispense: 2 mL; Refill: 0  2. Other hyperlipidemia 1.  Check lipids. 2.  Continue Lipitor.  3. Vitamin D deficiency Check vitamin D level.  4. B12 deficiency Check B12 level.  5. Obesity with a current BMI of 41.5 1.  Meal planning 2.  Intentional eating 3.  Labs ordered today (CMP, A1c, fasted lipid panel, vitamin D, B12, urine microalbumin)  Robin [name] is currently in the action stage of change. As such, her goal is to continue with weight loss efforts. She has agreed to the Category 3 Plan.   Exercise goals: as is  Behavioral modification strategies: increasing lean protein intake, decreasing simple carbohydrates, increasing vegetables, increasing water intake, decreasing eating out, no skipping meals, meal planning and cooking strategies, keeping healthy foods in the home, and planning for success.  Robin [name] has agreed to follow-up  with our clinic in 4 weeks.  Needs 40-minute follow-up visit.  She was informed of the importance of frequent follow-up visits to maximize her success with intensive lifestyle modifications for her multiple health conditions.   Robin [name] was informed we would discuss her lab results at her next visit unless there is a critical issue that needs to be addressed sooner. Robin [name] agreed to keep her next visit at the agreed upon time to discuss these results.  Objective:   Blood pressure 128/78, pulse 62, temperature 97.6 F (36.4 C), height '5\' 8"'$  (1.727 m), weight 272 lb (123.4 kg), SpO2 100 %. Body mass index is 41.36 kg/m.  General: Cooperative, alert, well developed, in no acute distress. HEENT: Conjunctivae and lids unremarkable. Cardiovascular: Regular rhythm.  Lungs: Normal work of breathing. Neurologic: No focal deficits.   Lab Results  Component Value Date   CREATININE 0.96 10/06/2022   BUN 16 10/06/2022   NA CANCELED 10/06/2022   K CANCELED 10/06/2022   CL CANCELED 10/06/2022   CO2 22 10/06/2022   Lab Results  Component Value Date   ALT 16 10/06/2022   AST 21 10/06/2022   ALKPHOS 74 10/06/2022   BILITOT 0.4 10/06/2022   Lab Results  Component Value Date   HGBA1C 5.3 10/06/2022   HGBA1C 6.3 07/10/2022   HGBA1C 5.3 03/10/2022   HGBA1C 5.7 (H) 11/17/2021   HGBA1C 5.7 11/10/2021   Lab Results  Component Value Date   INSULIN 28.2 (H) 11/17/2021   INSULIN 48.4 (H) 06/03/2021   Lab Results  Component Value Date  TSH 0.801 07/10/2022   Lab Results  Component Value Date   CHOL 140 10/06/2022   HDL 58 10/06/2022   LDLCALC 68 10/06/2022   TRIG 71 10/06/2022   CHOLHDL 2.2 07/10/2022   Lab Results  Component Value Date   VD25OH 37.7 10/06/2022   VD25OH 61.7 06/03/2021   Lab Results  Component Value Date   WBC 7.7 07/10/2022   HGB 12.4 07/10/2022   HCT 38.7 07/10/2022   MCV 91 07/10/2022   PLT 365 07/10/2022   Lab Results  Component Value Date   IRON 31  03/04/2015   TIBC 340 03/04/2015   FERRITIN 34 03/04/2015     Attestation Statements:   Reviewed by clinician on day of visit: allergies, medications, problem list, medical history, surgical history, family history, social history, and previous encounter notes.  I, Dawn Whitmire, FNP-C, am acting as transcriptionist for Dr. Jearld Lesch.  I have reviewed the above documentation for accuracy and completeness, and I agree with the above. Jearld Lesch, DO

## 2022-10-20 ENCOUNTER — Encounter: Payer: Self-pay | Admitting: Bariatrics

## 2022-10-27 ENCOUNTER — Ambulatory Visit (INDEPENDENT_AMBULATORY_CARE_PROVIDER_SITE_OTHER): Payer: Medicaid Other | Admitting: Clinical

## 2022-10-27 DIAGNOSIS — F341 Dysthymic disorder: Secondary | ICD-10-CM | POA: Diagnosis not present

## 2022-10-27 NOTE — Progress Notes (Signed)
   THERAPIST PROGRESS NOTE  Session Time: 40 minutes  Participation Level: Active  Behavioral Response: CasualAlertDepressed  Type of Therapy: Individual Therapy  Treatment Goals addressed: client will identify 3 cognitive patterns and beliefs that support depression  ProgressTowards Goals: Progressing  Interventions: CBT and Supportive  Summary:  Robin [name] is a 67 y.o. female who presents for the scheduled appointment oriented x 5, appropriately dressed, friendly.  Client denies hallucinations and delusions. Client reported she has been doing about the same.  Client reported over the holiday season she was able to see 2 of her kids and grandkids.  Client reported she is happy that her youngest daughter who lives in [place] and she was worried about is doing much and has moved into her own place with her daughter.  Client reported things at home have stayed the same.  Client reported she is pulled into different directions with her emotions when her husband asked nice towards her on some days and other days he was back to his usual mean behavior.  Client reported she was surprised that he brought her present for Christmas when for majority of the time he does not like to spend a lot of money on her.  Client reported she does not like that he causes her to feel down and she wants to change her perspective on that.  Client reported he does not keep her in the loop about what is truly going on with his health. Client reported otherwise she is looking forward to spending more time with her children and grandchildren this year and keeping her energy and positive places. Evidence of progress towards goal:  client reported practicing reframing her thoughts and feelings at least 2x per week.   Suicidal/Homicidal: Nowithout intent/plan  Therapist Response:  Therapist began the appointment asking the client how she has been doing since last seen. Therapist used CBT to engage using active  listening and positive emotional support. Therapist used CBT to engage and ask the client to describe her thoughts and feelings related to home life and how she is coping. Therapist used CBT to normalize the clients emotional response and engage in brainstorming a new perspective and expectations.  Therapist used CBT ask the client to identify her progress with frequency of use with coping skills with continued practice in her daily activity.    Therapist assigned the client homework to practice self care.    Plan: Return again in 3 weeks.  Diagnosis: persistent depressive disorder  Collaboration of Care: Patient refused AEB none requested by the client.  Patient/Guardian was advised Release of Information must be obtained prior to any record release in order to collaborate their care with an outside provider. Patient/Guardian was advised if they have not already done so to contact the registration department to sign all necessary forms in order for [name] to release information regarding their care.   Consent: Patient/Guardian gives verbal consent for treatment and assignment of benefits for services provided during this visit. Patient/Guardian expressed understanding and agreed to proceed.   [place], LCSW 10/27/2022

## 2022-10-28 DIAGNOSIS — F1120 Opioid dependence, uncomplicated: Secondary | ICD-10-CM | POA: Insufficient documentation

## 2022-11-03 ENCOUNTER — Ambulatory Visit (INDEPENDENT_AMBULATORY_CARE_PROVIDER_SITE_OTHER): Payer: Medicaid Other | Admitting: Bariatrics

## 2022-11-03 ENCOUNTER — Encounter: Payer: Self-pay | Admitting: Bariatrics

## 2022-11-03 VITALS — BP 134/71 | HR 62 | Temp 98.0°F | Ht 68.0 in | Wt 268.0 lb

## 2022-11-03 DIAGNOSIS — Z6841 Body Mass Index (BMI) 40.0 and over, adult: Secondary | ICD-10-CM | POA: Diagnosis not present

## 2022-11-03 DIAGNOSIS — F3289 Other specified depressive episodes: Secondary | ICD-10-CM | POA: Diagnosis not present

## 2022-11-03 DIAGNOSIS — E1169 Type 2 diabetes mellitus with other specified complication: Secondary | ICD-10-CM | POA: Diagnosis not present

## 2022-11-03 DIAGNOSIS — Z7985 Long-term (current) use of injectable non-insulin antidiabetic drugs: Secondary | ICD-10-CM

## 2022-11-03 MED ORDER — TRULICITY 4.5 MG/0.5ML ~~LOC~~ SOAJ: 4.5000 mg | SUBCUTANEOUS | 0 refills | Status: DC

## 2022-11-03 MED ORDER — TOPIRAMATE 50 MG PO TABS: 50.0000 mg | ORAL_TABLET | Freq: Two times a day (BID) | ORAL | 0 refills | Status: DC

## 2022-11-03 MED ORDER — ONDANSETRON HCL 4 MG PO TABS: 4.0000 mg | ORAL_TABLET | Freq: Three times a day (TID) | ORAL | 0 refills | Status: DC | PRN

## 2022-11-09 ENCOUNTER — Other Ambulatory Visit: Payer: Self-pay | Admitting: Internal Medicine

## 2022-11-09 DIAGNOSIS — E1159 Type 2 diabetes mellitus with other circulatory complications: Secondary | ICD-10-CM

## 2022-11-10 NOTE — Progress Notes (Unsigned)
Chief Complaint:   OBESITY Analys is here to discuss her progress with her obesity treatment plan along with follow-up of her obesity related diagnoses. Robin [name] is on the Category 3 Plan and states she is following her eating plan approximately 40% of the time. Robin [name] states she is doing chair exercises for 30-45 minutes 4 times per week.  Today's visit was #: 87 Starting weight: 310 lbs Starting date: 06/03/21 Today's weight: 268 lbs Today's date: 11/03/22 Total lbs lost to date: 42 lbs Total lbs lost since last in-office visit: -4  Interim History: She is down 4 pounds since her last visit.  She is starting to get herself on a schedule.  Subjective:   1. Diabetes mellitus type 2 in obese ([facility]) Mild nausea.  Taking Trulicity as directed.  2. Other depression, with emotional eating binges Topamax is helping with cravings throughout the day.  Assessment/Plan:   1. Diabetes mellitus type 2 in obese ([facility]) Refill: - Dulaglutide (TRULICITY) 4.5 WU/1.3KG SOPN; Inject 4.5 mg as directed once a week.  Dispense: 2 mL; Refill: 0 - ondansetron (ZOFRAN) 4 MG tablet; Take 1 tablet (4 mg total) by mouth every 8 (eight) hours as needed for nausea or vomiting.  Dispense: 20 tablet; Refill: 0  2. Other depression, with emotional eating binges Refill: - topiramate (TOPAMAX) 50 MG tablet; Take 1 tablet (50 mg total) by mouth 2 (two) times daily.  Dispense: 60 tablet; Refill: 0  3. Morbid obesity ([place]) 4. BMI 40.0-44.9, adult ([place]) 1.  Will adhere more closely to the plan. 2.  Mindful eating 3.  Will have protein shake for lunch 4.  Will keep water and protein high  Robin [name] is currently in the action stage of change. As such, her goal is to continue with weight loss efforts. She has agreed to the Category 3 Plan.   Exercise goals: Continue chair exercises.  Behavioral modification strategies: increasing lean protein intake, decreasing simple carbohydrates, increasing vegetables,  increasing water intake, decreasing eating out, no skipping meals, meal planning and cooking strategies, keeping healthy foods in the home, and planning for success.  Robin [name] has agreed to follow-up with our clinic in 4 weeks. She was informed of the importance of frequent follow-up visits to maximize her success with intensive lifestyle modifications for her multiple health conditions.   Objective:   Blood pressure 134/71, pulse 62, temperature 98 F (36.7 C), height '5\' 8"'$  (1.727 m), weight 268 lb (121.6 kg), SpO2 100 %. Body mass index is 40.75 kg/m.  General: Cooperative, alert, well developed, in no acute distress. HEENT: Conjunctivae and lids unremarkable. Cardiovascular: Regular rhythm.  Lungs: Normal work of breathing. Neurologic: No focal deficits.   Lab Results  Component Value Date   CREATININE 0.96 10/06/2022   BUN 16 10/06/2022   NA CANCELED 10/06/2022   K CANCELED 10/06/2022   CL CANCELED 10/06/2022   CO2 22 10/06/2022   Lab Results  Component Value Date   ALT 16 10/06/2022   AST 21 10/06/2022   ALKPHOS 74 10/06/2022   BILITOT 0.4 10/06/2022   Lab Results  Component Value Date   HGBA1C 5.3 10/06/2022   HGBA1C 6.3 07/10/2022   HGBA1C 5.3 03/10/2022   HGBA1C 5.7 (H) 11/17/2021   HGBA1C 5.7 11/10/2021   Lab Results  Component Value Date   INSULIN 28.2 (H) 11/17/2021   INSULIN 48.4 (H) 06/03/2021   Lab Results  Component Value Date   TSH 0.801 07/10/2022   Lab Results  Component Value  Date   CHOL 140 10/06/2022   HDL 58 10/06/2022   LDLCALC 68 10/06/2022   TRIG 71 10/06/2022   CHOLHDL 2.2 07/10/2022   Lab Results  Component Value Date   VD25OH 37.7 10/06/2022   VD25OH 61.7 06/03/2021   Lab Results  Component Value Date   WBC 7.7 07/10/2022   HGB 12.4 07/10/2022   HCT 38.7 07/10/2022   MCV 91 07/10/2022   PLT 365 07/10/2022   Lab Results  Component Value Date   IRON 31 03/04/2015   TIBC 340 03/04/2015   FERRITIN 34 03/04/2015      Attestation Statements:   Reviewed by clinician on day of visit: allergies, medications, problem list, medical history, surgical history, family history, social history, and previous encounter notes.  I, Dawn Whitmire, FNP-C, am acting as transcriptionist for Dr. Jearld Lesch.  I have reviewed the above documentation for accuracy and completeness, and I agree with the above. Jearld Lesch, DO

## 2022-11-10 NOTE — Telephone Encounter (Signed)
Requested Prescriptions  Pending Prescriptions Disp Refills   metFORMIN (GLUCOPHAGE) 500 MG tablet [Pharmacy Med Name: metformin 500 mg tablet] 270 tablet 0    Sig: TAKE TWO TABLETS BY MOUTH EVERY MORNING and TAKE ONE TABLET BY MOUTH EVERY EVENING     Endocrinology:  Diabetes - Biguanides Failed - 11/09/2022  9:17 AM      Failed - B12 Level in normal range and within 720 days    Vitamin B-12  Date Value Ref Range Status  10/06/2022 1,447 (H) 232 - 1,245 pg/mL Final         Failed - CBC within normal limits and completed in the last 12 months    WBC  Date Value Ref Range Status  07/10/2022 7.7 3.4 - 10.8 x10E3/uL Final  01/04/2021 12.0 (H) 4.0 - 10.5 K/uL Final   RBC  Date Value Ref Range Status  07/10/2022 4.26 3.77 - 5.28 x10E6/uL Final  01/04/2021 4.34 3.87 - 5.11 MIL/uL Final   Hemoglobin  Date Value Ref Range Status  07/10/2022 12.4 11.1 - 15.9 g/dL Final   HCT  Date Value Ref Range Status  06/25/2011 41 % Final   Hematocrit  Date Value Ref Range Status  07/10/2022 38.7 34.0 - 46.6 % Final   MCHC  Date Value Ref Range Status  07/10/2022 32.0 31.5 - 35.7 g/dL Final  01/04/2021 31.2 30.0 - 36.0 g/dL Final   [facility]  Date Value Ref Range Status  07/10/2022 29.1 26.6 - 33.0 pg Final  01/04/2021 29.5 26.0 - 34.0 pg Final   MCV  Date Value Ref Range Status  07/10/2022 91 79 - 97 fL Final  06/25/2011 91.1 fL Final   No results found for: "PLTCOUNTKUC", "LABPLAT", "POCPLA" RDW  Date Value Ref Range Status  07/10/2022 11.9 11.7 - 15.4 % Final         Passed - Cr in normal range and within 360 days    Creat  Date Value Ref Range Status  09/22/2016 0.80 0.50 - 1.05 mg/dL Final    Comment:      For patients > or = 65 years of age: The upper reference limit for Creatinine is approximately 13% higher for people identified as African-American.      Creatinine, Ser  Date Value Ref Range Status  10/06/2022 0.96 0.57 - 1.00 mg/dL Final         Passed - HBA1C is  between 0 and 7.9 and within 180 days    HbA1c, POC (controlled diabetic range)  Date Value Ref Range Status  07/10/2022 6.3 0.0 - 7.0 % Final   Hgb A1c MFr Bld  Date Value Ref Range Status  10/06/2022 5.3 4.8 - 5.6 % Final    Comment:             Prediabetes: 5.7 - 6.4          Diabetes: >6.4          Glycemic control for adults with diabetes: <7.0          Passed - eGFR in normal range and within 360 days    GFR, Est African American  Date Value Ref Range Status  09/22/2016 >89 >=60 mL/min Final   GFR calc Af Amer  Date Value Ref Range Status  12/26/2019 >60 >60 mL/min Final   GFR, Est Non African American  Date Value Ref Range Status  09/22/2016 82 >=60 mL/min Final   GFR, Estimated  Date Value Ref Range Status  01/04/2021 >60 >60  mL/min Final    Comment:    (NOTE) Calculated using the CKD-EPI Creatinine Equation (2021)    GFR  Date Value Ref Range Status  03/08/2018 87.84 >60.00 mL/min Final   eGFR  Date Value Ref Range Status  10/06/2022 66 >59 mL/min/1.73 Final         Passed - Valid encounter within last 6 months    Recent Outpatient Visits           4 months ago Type 2 diabetes mellitus with morbid obesity ([place])   [place] Karle Plumber B, MD   8 months ago Type 2 diabetes mellitus with morbid obesity [facility])   [place] Karle Plumber B, MD   8 months ago Mixed stress and urge urinary incontinence   [place], Deborah B, MD   1 year ago Type 2 diabetes mellitus with morbid obesity [facility])   [place] Ladell Pier, MD   1 year ago Need for shingles vaccine   [place], RPH-CPP       Future Appointments             In 2 days Ladell Pier, MD [place]              spironolactone (ALDACTONE) 25 MG tablet [Pharmacy Med Name: spironolactone 25 mg tablet] 90 tablet 0    Sig: TAKE ONE TABLET BY MOUTH EVERY MORNING     Cardiovascular: Diuretics - Aldosterone Antagonist Passed - 11/09/2022  9:17 AM      Passed - Cr in normal range and within 180 days    Creat  Date Value Ref Range Status  09/22/2016 0.80 0.50 - 1.05 mg/dL Final    Comment:      For patients > or = 65 years of age: The upper reference limit for Creatinine is approximately 13% higher for people identified as African-American.      Creatinine, Ser  Date Value Ref Range Status  10/06/2022 0.96 0.57 - 1.00 mg/dL Final         Passed - K in normal range and within 180 days    Potassium  Date Value Ref Range Status  10/06/2022 CANCELED mmol/L     Comment:    Test not performed. Due to technical problems in testing, a valid result could not be obtained. The remaining specimen was not sufficient for additional testing.  Result canceled by the ancillary.   06/25/2011 4.2 mmol/L Final         Passed - Na in normal range and within 180 days    Sodium  Date Value Ref Range Status  10/06/2022 CANCELED mmol/L     Comment:    Test not performed. Due to technical problems in testing, a valid result could not be obtained. The remaining specimen was not sufficient for additional testing.  Result canceled by the ancillary.          Passed - eGFR is 30 or above and within 180 days    GFR, Est African American  Date Value Ref Range Status  09/22/2016 >89 >=60 mL/min Final   GFR calc Af Amer  Date Value Ref Range Status  12/26/2019 >60 >60 mL/min Final   GFR, Est Non African American  Date Value Ref Range Status  09/22/2016 82 >=60  mL/min Final   GFR, Estimated  Date Value Ref Range Status  01/04/2021 >60 >60 mL/min Final    Comment:    (NOTE) Calculated using the CKD-EPI Creatinine Equation (2021)    GFR  Date Value Ref Range Status  03/08/2018 87.84 >60.00 mL/min  Final   eGFR  Date Value Ref Range Status  10/06/2022 66 >59 mL/min/1.73 Final         Passed - Last BP in normal range    BP Readings from Last 1 Encounters:  11/03/22 134/71         Passed - Valid encounter within last 6 months    Recent Outpatient Visits           4 months ago Type 2 diabetes mellitus with morbid obesity ([place])   [place] Karle Plumber B, MD   8 months ago Type 2 diabetes mellitus with morbid obesity [facility])   [place] Karle Plumber B, MD   8 months ago Mixed stress and urge urinary incontinence   [place] Karle Plumber B, MD   1 year ago Type 2 diabetes mellitus with morbid obesity [facility])   [place] Ladell Pier, MD   1 year ago Need for shingles vaccine   [place], RPH-CPP       Future Appointments             In 2 days Ladell Pier, MD [place]             Metoprolol Tartrate 75 MG TABS [Pharmacy Med Name: metoprolol tartrate 75 mg tablet] 180 tablet 0    Sig: TAKE ONE TABLET BY MOUTH EVERY MORNING and TAKE ONE TABLET BY MOUTH EVERY EVENING     Cardiovascular:  Beta Blockers Passed - 11/09/2022  9:17 AM      Passed - Last BP in normal range    BP Readings from Last 1 Encounters:  11/03/22 134/71         Passed - Last Heart Rate in normal range    Pulse Readings from Last 1 Encounters:  11/03/22 62         Passed - Valid encounter within last 6 months    Recent Outpatient Visits           4 months ago Type 2 diabetes mellitus with morbid obesity ([place])   [place] Karle Plumber B, MD   8 months ago Type 2 diabetes mellitus with morbid obesity [facility])   [place] Ladell Pier, MD   8 months ago  Mixed stress and urge urinary incontinence   [place], MD   1 year ago Type 2 diabetes mellitus with morbid obesity [facility])   [place] Ladell Pier, MD   1 year ago Need for shingles vaccine   [place], RPH-CPP       Future Appointments             In 2 days Ladell Pier, MD [place]

## 2022-11-11 ENCOUNTER — Encounter: Payer: Self-pay | Admitting: Bariatrics

## 2022-11-12 ENCOUNTER — Ambulatory Visit: Payer: Medicaid Other | Attending: Internal Medicine | Admitting: Internal Medicine

## 2022-11-12 ENCOUNTER — Encounter: Payer: Self-pay | Admitting: Internal Medicine

## 2022-11-12 DIAGNOSIS — Z87891 Personal history of nicotine dependence: Secondary | ICD-10-CM | POA: Insufficient documentation

## 2022-11-12 DIAGNOSIS — Z79890 Hormone replacement therapy (postmenopausal): Secondary | ICD-10-CM | POA: Insufficient documentation

## 2022-11-12 DIAGNOSIS — M542 Cervicalgia: Secondary | ICD-10-CM | POA: Diagnosis not present

## 2022-11-12 DIAGNOSIS — Z6841 Body Mass Index (BMI) 40.0 and over, adult: Secondary | ICD-10-CM | POA: Diagnosis not present

## 2022-11-12 DIAGNOSIS — I10 Essential (primary) hypertension: Secondary | ICD-10-CM | POA: Insufficient documentation

## 2022-11-12 DIAGNOSIS — E1159 Type 2 diabetes mellitus with other circulatory complications: Secondary | ICD-10-CM | POA: Diagnosis not present

## 2022-11-12 DIAGNOSIS — Z8249 Family history of ischemic heart disease and other diseases of the circulatory system: Secondary | ICD-10-CM | POA: Diagnosis not present

## 2022-11-12 DIAGNOSIS — F321 Major depressive disorder, single episode, moderate: Secondary | ICD-10-CM | POA: Diagnosis not present

## 2022-11-12 DIAGNOSIS — F411 Generalized anxiety disorder: Secondary | ICD-10-CM | POA: Insufficient documentation

## 2022-11-12 DIAGNOSIS — R053 Chronic cough: Secondary | ICD-10-CM

## 2022-11-12 DIAGNOSIS — Z7984 Long term (current) use of oral hypoglycemic drugs: Secondary | ICD-10-CM | POA: Insufficient documentation

## 2022-11-12 DIAGNOSIS — M797 Fibromyalgia: Secondary | ICD-10-CM | POA: Diagnosis not present

## 2022-11-12 DIAGNOSIS — E039 Hypothyroidism, unspecified: Secondary | ICD-10-CM

## 2022-11-12 DIAGNOSIS — Z7985 Long-term (current) use of injectable non-insulin antidiabetic drugs: Secondary | ICD-10-CM | POA: Insufficient documentation

## 2022-11-12 DIAGNOSIS — Z833 Family history of diabetes mellitus: Secondary | ICD-10-CM | POA: Insufficient documentation

## 2022-11-12 DIAGNOSIS — F329 Major depressive disorder, single episode, unspecified: Secondary | ICD-10-CM | POA: Diagnosis not present

## 2022-11-12 DIAGNOSIS — R1312 Dysphagia, oropharyngeal phase: Secondary | ICD-10-CM | POA: Diagnosis not present

## 2022-11-12 DIAGNOSIS — E049 Nontoxic goiter, unspecified: Secondary | ICD-10-CM

## 2022-11-12 DIAGNOSIS — Z79899 Other long term (current) drug therapy: Secondary | ICD-10-CM | POA: Diagnosis not present

## 2022-11-12 DIAGNOSIS — G4733 Obstructive sleep apnea (adult) (pediatric): Secondary | ICD-10-CM | POA: Diagnosis not present

## 2022-11-12 DIAGNOSIS — E1169 Type 2 diabetes mellitus with other specified complication: Secondary | ICD-10-CM

## 2022-11-12 DIAGNOSIS — I152 Hypertension secondary to endocrine disorders: Secondary | ICD-10-CM

## 2022-11-12 MED ORDER — METFORMIN HCL 500 MG PO TABS: ORAL_TABLET | ORAL | 3 refills | Status: DC

## 2022-11-12 MED ORDER — HYDRALAZINE HCL 50 MG PO TABS: ORAL_TABLET | ORAL | 3 refills | Status: DC

## 2022-11-12 MED ORDER — METOPROLOL TARTRATE 50 MG PO TABS: 50.0000 mg | ORAL_TABLET | Freq: Two times a day (BID) | ORAL | 3 refills | Status: DC

## 2022-11-12 MED ORDER — SPIRONOLACTONE 25 MG PO TABS: 25.0000 mg | ORAL_TABLET | Freq: Every morning | ORAL | 1 refills | Status: DC

## 2022-11-12 MED ORDER — LOSARTAN POTASSIUM 25 MG PO TABS: 25.0000 mg | ORAL_TABLET | Freq: Every evening | ORAL | 2 refills | Status: DC

## 2022-11-12 NOTE — Progress Notes (Signed)
A1C done in January = 5.3

## 2022-11-12 NOTE — Progress Notes (Signed)
Patient ID: Robin [name], female    DOB: 1955-11-18  MRN: [id number]  CC: Diabetes (DM f/u./Pain in neck x1 mo, thick mucus building in chest more frequently/A1C already done in Jan. Already received flu vax.) and Establish Care   Subjective: Robin [name] is a 67 y.o. female who presents for chronic ds management Her concerns today include:  Patient with history of HTN, DM, OSA, fibromyalgia, depression, obesity,  GERD, hypothyroid, OA of multiple js (hands, knees, shoulders, hips), pituitary adenoma status post resection   Pt has med box from [facility] with her.  I did med reconciliation at the beginning of our visit.  DM/Obesity: Lab Results  Component Value Date   HGBA1C 5.3 10/06/2022  Currently on Trulicity 4.5 mg weekly, metformin 1 g in the morning and 500 mg in the evening.  Also on Topamax to assist with weight loss.  Still followed by medical weight management.  She has lost over 40 pounds since joining the program.  Weight is up 4 pounds since I last saw her. Eating smaller portions Checks BS once a day in a.m. and has a log with her.  Range has been 89-107 with one-time low of 79.  no freq lows Moving more.  Does some chair Exercise 3-4x/wk by watching video  OSA: saw Dr. Claiborne Billings.  He has reordered a sleep study she is waiting for this to be scheduled.  MDD/GAD:  followed by [facility].  Stable on Wellbutrin 150 mg XL daily, Cymbalta 90 mg daily  Hypothyroid: taking Levothyroxine consistently  HTN: checking BP every day and has readings with her.  Recent readings are 117/69, 126/78, 128/70, 133/72.  Reports compliance with taking her medications.  On last visit she reported cough that is sometimes productive of mucus that makes her throat sore.  At that point she was on Singulair and using Astelin and Flonase nasal spray.  I recommend adding Allegra and gave some Tessalon Perles.  Today she reports that the cough is no better.  C/o thick mucus building in chest.  Not  better since last visit.  Sometimes has blood in mucus.  Worse in mornings Has not seen Dr. Melvyn Novas in a while  C/o Pain RT side of neck x 1 mth.  She points to the submandibular area as well as the soft tissue of the anterior lateral neck on that side.  "Feels like an empty hole there.  Endorses problems swallowing her pills and food on the right side at times.   Patient Active Problem List   Diagnosis Date Noted   BMI 40.0-44.9, adult ([place]) 11/03/2022   B12 deficiency 10/06/2022   Depression 07/09/2022   Class 3 severe obesity with serious comorbidity and body mass index (BMI) of 45.0 to 49.9 in adult [facility]) 07/09/2022   Major depressive disorder, recurrent episode ([place]) 07/02/2022   Generalized anxiety disorder 07/02/2022   Body mass index 40.0-44.9, adult ([place]) 02/25/2022   Morbid obesity ([place]) 02/25/2022   Right knee pain 02/18/2022   Diabetes mellitus type 2 in obese ([place]) 02/18/2022   Lumbar foraminal stenosis 01/23/2022   Lumbar radiculopathy 01/23/2022   Pain in both knees 01/23/2022   Vitamin D deficiency 11/18/2021   Polyp of descending colon 02/15/2020   Pituitary tumor 08/21/2019   Primary osteoarthritis of both hips 08/21/2019   Primary osteoarthritis of both knees 08/21/2019   Pain in left shoulder 08/21/2019   Morbid obesity with BMI of 60.0-69.9, adult ([place]) 07/17/2019   Pedal edema 07/17/2019  Multilevel degenerative disc disease 10/31/2018   Change in bowel function 10/28/2018   Gastritis and gastroduodenitis 04/26/2018   Atrophic vaginitis 04/01/2018   Cough, persistent 02/08/2018   Dysphagia 12/14/2017   Generalized OA 07/30/2017   Urinary incontinence 07/23/2017   Early satiety 06/16/2017   Hx of iron deficiency anemia 05/28/2017   Throat pain in adult 04/05/2017   Prediabetes 02/04/2017   Dyspnea on exertion 12/23/2016   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 12/23/2016   Hemoptysis 12/23/2016   Fibromyalgia  08/18/2016   Chronic lymphocytic thyroiditis 08/18/2016   Persistent depressive disorder 04/01/2016   OSA (obstructive sleep apnea) 04/01/2016   Essential hypertension 12/09/2015   Hypothyroidism 12/09/2015   Morbid obesity due to excess calories ([place]) 12/09/2015   Constipation 05/27/2015   Fatty liver 05/27/2015   Abdominal pain, chronic, epigastric 07/04/2012   History of gastroesophageal reflux (GERD) 06/09/2012   Hyperlipidemia 06/09/2012   Refusal of blood transfusions as patient is Jehovah's Witness 06/09/2012   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      aspirin EC 81 MG tablet Take 81 mg by mouth at bedtime.     atorvastatin (LIPITOR) 40 MG tablet TAKE ONE TABLET BY MOUTH EVERY EVENING 90 tablet 1   azelastine (ASTELIN) 0.1 % nasal spray 1-2 sprays per nostril 2 times daily as needed 30 mL 12   benzonatate (TESSALON) 200 MG capsule Take 1 capsule (200 mg total) by mouth 2 (two) times daily as needed for cough. 20 capsule 0   buPROPion (WELLBUTRIN XL) 150 MG 24 hr tablet Take 1 tablet (150 mg total) by mouth daily. 30 tablet 2   celecoxib (CELEBREX) 100 MG capsule TAKE ONE CAPSULE BY MOUTH TWICE DAILY 180 capsule 1   CINNAMON PO Take 1 capsule by mouth daily.     Dulaglutide (TRULICITY) 4.5 LK/4.4WN SOPN Inject 4.5 mg as directed once a week. 2 mL 0   DULoxetine (CYMBALTA) 30 MG capsule Take 3 capsules (90 mg total) by mouth daily. 90 capsule 2   esomeprazole (NEXIUM) 40 MG capsule TAKE ONE CAPSULE BY MOUTH EVERY MORNING and TAKE ONE CAPSULE BY MOUTH EVERY EVENING 60 capsule 0   fexofenadine (ALLEGRA) 180 MG tablet Take 1 tablet (180 mg total) by mouth daily. 30 tablet 0   fluticasone (FLONASE) 50 MCG/ACT nasal  spray Place 1 spray into both nostrils daily. 16 g 1   gabapentin (NEURONTIN) 300 MG capsule TAKE ONE CAPSULE BY MOUTH EVERY EVENING (WITH '600mg'$  tab FOR '900mg'$  DOSE) 30 capsule 6   gabapentin (NEURONTIN) 600 MG tablet TAKE ONE TABLET BY MOUTH EVERY MORNING and TAKE ONE TABLET BY MOUTH AT NOON and TAKE ONE TABLET EVERY EVENING 270 tablet 1   glucose blood (ACCU-CHEK AVIVA PLUS) test strip USE AS DIRECTED 100 strip 0   glucose blood test strip USE AS INSTRUCTED     glucose blood test strip USE AS INSTRUCTED     hydrALAZINE (APRESOLINE) 50 MG tablet TAKE ONE TABLET BY MOUTH EVERY MORNING and TAKE ONE TABLET BY MOUTH EVERY EVENING 180 tablet 0   hydrochlorothiazide (HYDRODIURIL) 25 MG tablet TAKE ONE TABLET BY MOUTH EVERY MORNING 90 tablet 1   levothyroxine (SYNTHROID) 88 MCG tablet TAKE  ONE TABLET BY MOUTH BEFORE BREAKFAST 90 tablet 3   levothyroxine (SYNTHROID) 88 MCG tablet Take 1 tablet by mouth daily.     linaclotide (LINZESS) 290 MCG CAPS capsule TAKE ONE CAPSULE BY MOUTH BEFORE BREAKFAST 90 capsule 3   losartan (COZAAR) 25 MG tablet TAKE ONE TABLET BY MOUTH EVERY EVENING 90 tablet 0   metFORMIN (GLUCOPHAGE) 500 MG tablet TAKE TWO TABLETS BY MOUTH EVERY MORNING and TAKE ONE TABLET BY MOUTH EVERY EVENING 270 tablet 0   Metoprolol Tartrate 75 MG TABS TAKE ONE TABLET BY MOUTH EVERY MORNING and TAKE ONE TABLET BY MOUTH EVERY EVENING 180 tablet 0   montelukast (SINGULAIR) 10 MG tablet TAKE ONE TABLET BY MOUTH EVERY EVENING 90 tablet 1   Multiple Vitamin (MULTIVITAMIN WITH MINERALS) TABS tablet Take 1 tablet by mouth daily.     NARCAN 4 MG/0.1ML LIQD nasal spray kit      Omega-3 Fatty Acids (FISH OIL) 1000 MG CAPS Take 1,000 mg by mouth at bedtime.     ondansetron (ZOFRAN) 4 MG tablet Take 1 tablet (4 mg total) by mouth every 8 (eight) hours as needed for nausea or vomiting. 20 tablet 0   Sodium Chloride-Sodium Bicarb (NETI POT SINUS WASH) 2300-700 MG KIT DO nasal rinse twice a week as needed 1 kit 0    topiramate (TOPAMAX) 50 MG tablet Take 1 tablet (50 mg total) by mouth 2 (two) times daily. 60 tablet 0   amLODipine (NORVASC) 10 MG tablet Take 1 tablet by mouth daily.     CALCIUM CARBONATE ANTACID PO Take 2 tablets by mouth See admin instructions. Take 2 tablets by mouth every morning, may also take 2 tablets at night as needed for acid reflux     meloxicam (MOBIC) 15 MG tablet      NEOMYCIN-POLYMYXIN-HYDROCORTISONE (CORTISPORIN) 1 % SOLN OTIC solution Place 3 drops into the left ear 4 (four) times daily. 10 mL 0   spironolactone (ALDACTONE) 25 MG tablet TAKE ONE TABLET BY MOUTH EVERY MORNING (Patient not taking: Reported on 11/12/2022) 90 tablet 0   tiZANidine (ZANAFLEX) 2 MG tablet Take by mouth.     traMADol (ULTRAM) 50 MG tablet Take 1 tablet (50 mg total) by mouth every 12 (twelve) hours as needed. (Patient not taking: Reported on 11/12/2022) 30 tablet 2   VENTOLIN HFA 108 (90 Base) MCG/ACT inhaler INHALE 1-2 PUFFS BY MOUTH into THE lungs EVERY 6 HOURS AS NEEDED FOR WHEEZING AND/OR SHORTNESS OF BREATH 18 g 2   vitamin B-12 (CYANOCOBALAMIN) 1000 MCG tablet Take 1,000 mcg by mouth daily.     Vitamin D, Ergocalciferol, (DRISDOL) 1.25 MG (50000 UNIT) CAPS capsule Take 1 capsule (50,000 Units total) by mouth once a week. 12 capsule 0   [DISCONTINUED] amitriptyline (ELAVIL) 50 MG tablet TAKE 1 TABLET BY MOUTH AT BEDTIME. 90 tablet 0   No current facility-administered medications on file prior to visit.    Allergies  Allergen Reactions   Amlodipine Besylate Other (See Comments)    Edema in lower extremities Other reaction(s): Other Edema in lower extremities   Celexa [Citalopram Hydrobromide] Other (See Comments)    "Made me feel out of it"   Gabapentin Other (See Comments)    Contributed to lower extremity edema   Diflucan [Fluconazole] Rash    Social History   Socioeconomic History   Marital status: Married    Spouse name: Not on file   Number of children: 4   Years of education:  10   Highest  education level: Not on file  Occupational History   Occupation: Disabled  Tobacco Use   Smoking status: Former    Packs/day: 0.50    Years: 10.00    Total pack years: 5.00    Types: Cigarettes    Start date: 07/14/1975    Quit date: 04/04/1993    Years since quitting: 29.6   Smokeless tobacco: Never   Tobacco comments:    quit 20 yrs ago  Vaping Use   Vaping Use: Never used  Substance and Sexual Activity   Alcohol use: No    Alcohol/week: 0.0 standard drinks of alcohol   Drug use: No   Sexual activity: Yes  Other Topics Concern   Not on file  Social History Narrative   Lives with husband in an apartment on the first floor.  Has 4 children.     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  Trying to get disability.  Formerly worked as a Teacher, early years/pre.  Education: 11th grade.      [facility] (12/23/16):   Originally from [place]. She was raised in [place]. 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 No pets currently. No bird exposure. She does have mold in her current home in the bathroom, laundry room, & master bedroom.    Social Determinants of Health   Financial Resource Strain: Low Risk  (10/15/2020)   Overall Financial Resource Strain (CARDIA)    Difficulty of Paying Living Expenses: Not very hard  Food Insecurity: No Food Insecurity (10/15/2020)   Hunger Vital Sign    Worried About Running Out of Food in the Last Year: Never true    Ran Out of Food in the Last Year: Never true  Transportation Needs: Unmet Transportation Needs (10/15/2020)   PRAPARE - Hydrologist (Medical): Yes    Lack of Transportation (Non-Medical): No  Physical Activity: Inactive (10/15/2020)   Exercise Vital Sign    Days of Exercise per Week: 0 days    Minutes of Exercise per Session: 0 min  Stress: Stress  Concern Present (10/15/2020)   [place]    Feeling of Stress : To some extent  Social Connections: Socially Integrated (08/14/2020)   Social Connection and Isolation Panel [NHANES]    Frequency of Communication with Friends and Family: More than three times a week    Frequency of Social Gatherings with Friends and Family: More than three times a week    Attends Religious Services: More than 4 times per year    Active Member of [employer] or Organizations: Yes    Attends Music therapist: More than 4 times per year    Marital Status: Married  Human resources officer Violence: Not At Risk (10/15/2020)   Humiliation, Afraid, Rape, and Kick questionnaire    Fear of Current or Ex-Partner: No    Emotionally Abused: No    Physically Abused: No    Sexually Abused: No    Family History  Problem Relation Age of Onset   Diabetes Mother    Hypertension Mother    Heart Problems Mother    Hyperlipidemia Mother    Asthma Mother    Sleep apnea Mother    Obesity Mother    Colon polyps Mother    Diabetes  Father    Kidney cancer Father    Hypertension Father    Hyperlipidemia Father    Sleep apnea Father    Hypertension Sister    Allergic rhinitis Sister    Hyperlipidemia Sister    Liver disease Brother    Arthritis Brother    Hypertension Brother    Hyperlipidemia Brother    Hypertension Brother    Hyperlipidemia Brother    Breast cancer Maternal Grandmother    Kidney disease Maternal Grandfather    Alcoholism Maternal Grandfather    Breast cancer Paternal Grandmother    Hypertension Daughter    Hyperlipidemia Daughter    Eczema Daughter    Lupus Maternal Aunt    Stomach cancer Other        aunt   Hypertension Son    Colon cancer Neg Hx    Immunodeficiency Neg Hx    Urticaria Neg Hx     Past Surgical History:  Procedure Laterality Date   ABDOMINAL HYSTERECTOMY  02/18/2000   CARDIAC CATHETERIZATION   08/18/2003   normal L main/LAD/L Cfx/RCA (Dr. Adora Fridge)   COLONOSCOPY  2006   Dr. [name], hyperplastic polyps   COLONOSCOPY  08/06/2011   abnormal terminal ileum for 10cm, erosions, geographical ulceration. Bx small bowel mucosa with prominent intramucosal lymphoid aggregates, slightly inflammed   COLONOSCOPY WITH PROPOFOL N/A 12/28/2019   Rourk: 4 sessile serrated adenomas removed on the colon.  Next colonoscopy in 3 years.   DIRECT LARYNGOSCOPY  03/15/2012   Procedure: DIRECT LARYNGOSCOPY;  Surgeon: [name], MD;  Location: [place];  Service: ENT;  Laterality: N/A; Dr. Wolicki--:>no foreign body seen. normal esophagus to 40cm   ESOPHAGEAL DILATION  04/17/2015   Procedure: ESOPHAGEAL DILATION;  Surgeon: [name], MD;  [facility];  Service: Endoscopy;;   ESOPHAGOGASTRODUODENOSCOPY  08/23/2008   XTA:VWPV distal esophageal erosions consistent with mild erosive reflux esophagitis, otherwise unremarkable esophagus/ Tiny antral erosions of doubtful clinical significance, otherwise normal stomach, patent pylorus, normal D1 and D2   ESOPHAGOGASTRODUODENOSCOPY  08/06/2011   small hh, noncritical Schatzki's ring s/p 36 F   ESOPHAGOGASTRODUODENOSCOPY N/A 04/17/2015   XYI:AXKPVV s/p dilation   ESOPHAGOGASTRODUODENOSCOPY (EGD) WITH PROPOFOL N/A 01/20/2018   Dr. [name]: Erosive gastropathy, normal-appearing esophagus status post empiric dilation.  Chronic gastritis, no H. pylori.   ESOPHAGOSCOPY  03/15/2012   Procedure: ESOPHAGOSCOPY;  Surgeon: [name], MD;  Location: [place];  Service: ENT;  Laterality: N/A;   KNEE ARTHROSCOPY Left 04/27/2001   MALONEY DILATION N/A 01/20/2018   Procedure: Venia Minks DILATION;  Surgeon: [name], MD;  [facility];  Service: Endoscopy;  Laterality: N/A;   NM MYOCAR PERF WALL MOTION  2003   negative bruce protocol exercise tress test; EF 68%; intermediate risk study due to evidence of anterior wall ischemia extending from  mid-ventricle to apex   PITUITARY SURGERY  06/2012   benign tumor, surgeon at [facility]   POLYPECTOMY  12/28/2019   Procedure: POLYPECTOMY;  Surgeon: [name], MD;  [facility];  Service: Endoscopy;;   RECTOCELE REPAIR     TRANSTHORACIC ECHOCARDIOGRAM  2003   EF normal   VAGINAL PROLAPSE REPAIR     VIDEO BRONCHOSCOPY Bilateral 03/08/2017   Procedure: VIDEO BRONCHOSCOPY WITHOUT FLUORO;  Surgeon: Javier Glazier, MD;  Location: [place];  Service: Cardiopulmonary;  Laterality: Bilateral;    ROS: Review of Systems Negative except as stated above  PHYSICAL EXAM: BP 110/71 (BP Location: Left Arm, Patient  Position: Sitting, Cuff Size: Large)   Pulse (!) 57   Temp 98.4 F (36.9 C) (Oral)   Ht '5\' 8"'$  (1.727 m)   Wt 272 lb (123.4 kg)   SpO2 100%   BMI 41.36 kg/m   Wt Readings from Last 3 Encounters:  11/12/22 272 lb (123.4 kg)  11/03/22 268 lb (121.6 kg)  10/06/22 272 lb (123.4 kg)    Physical Exam  General appearance - alert, well appearing, obese female sitting in mobility chair and in no distress Mental status - normal mood, behavior, speech, dress, motor activity, and thought processes Mouth - mucous membranes moist, pharynx normal without lesions Neck -she has mild thyroid enlargement on the right side but no nodules appreciated.  No cervical or axillary lymphadenopathy appreciated. Chest - clear to auscultation, no wheezes, rales or rhonchi, symmetric air entry Heart - normal rate, regular rhythm, normal S1, S2, no murmurs, rubs, clicks or gallops Extremities -no lower extremity edema.      Latest Ref Rng & Units 10/06/2022   10:27 AM 11/17/2021    9:41 AM 01/04/2021    3:55 PM  CMP  Glucose 70 - 99 mg/dL 102  82  133   BUN 8 - 27 mg/dL '16  11  10   '$ Creatinine 0.57 - 1.00 mg/dL 0.96  0.90  0.74   Sodium mmol/L CANCELED  143  137   Potassium mmol/L CANCELED  4.4  3.5   Chloride mmol/L CANCELED  103  100   CO2 20 - 29 mmol/L '22  26  29   '$ Calcium  8.7 - 10.3 mg/dL 10.4  9.9  9.6   Total Protein 6.0 - 8.5 g/dL 8.2  7.4  7.9   Total Bilirubin 0.0 - 1.2 mg/dL 0.4  0.4  0.6   Alkaline Phos 44 - 121 IU/L 74  70  53   AST 0 - 40 IU/L 21  20  32   ALT 0 - 32 IU/L 16  20  35    Lipid Panel     Component Value Date/Time   CHOL 140 10/06/2022 1027   TRIG 71 10/06/2022 1027   HDL 58 10/06/2022 1027   CHOLHDL 2.2 07/10/2022 1036   CHOLHDL 2.3 12/16/2015 0950   VLDL 13 12/16/2015 0950   LDLCALC 68 10/06/2022 1027    CBC    Component Value Date/Time   WBC 7.7 07/10/2022 1036   WBC 12.0 (H) 01/04/2021 1555   RBC 4.26 07/10/2022 1036   RBC 4.34 01/04/2021 1555   HGB 12.4 07/10/2022 1036   HCT 38.7 07/10/2022 1036   HCT 41 06/25/2011 1053   PLT 365 07/10/2022 1036   MCV 91 07/10/2022 1036   MCV 91.1 06/25/2011 1053   MCH 29.1 07/10/2022 1036   MCH 29.5 01/04/2021 1555   MCHC 32.0 07/10/2022 1036   MCHC 31.2 01/04/2021 1555   RDW 11.9 07/10/2022 1036   LYMPHSABS 4.6 (H) 01/04/2021 1555   LYMPHSABS 3.4 (H) 01/20/2017 1001   MONOABS 1.6 (H) 01/04/2021 1555   EOSABS 0.1 01/04/2021 1555   EOSABS 0.1 01/20/2017 1001   BASOSABS 0.1 01/04/2021 1555   BASOSABS 0.0 01/20/2017 1001    ASSESSMENT AND PLAN: 1. Type 2 diabetes mellitus with morbid obesity (HCC) At goal.  Continue Trulicity and metformin. Encouraged her to eat smaller portions and try to move is much as possible.  2. Hypertension associated with diabetes ([place]) At goal.  On last visit we had discontinued the furosemide.  This  time we will decrease the dose of the metoprolol from 75 mg twice a day to 50 mg twice a day.  On future visit we will continue to try to wean her off some of the medications as long as her blood pressure is remaining good.  She will continue her other medications which are spironolactone, Norvasc 10 mg daily, Cozaar 25 mg daily, hydrochlorothiazide 25 mg daily, hydralazine 50 mg twice a day - metoprolol tartrate (LOPRESSOR) 50 MG tablet; Take 1  tablet (50 mg total) by mouth 2 (two) times daily.  Dispense: 180 tablet; Refill: 3  3. Moderate major depression ([place]) Followed by and plugged into mental health services.  4. OSA (obstructive sleep apnea) Advised patient to call Dr. Evette Georges office to inquire about the appointment for the sleep study.  5. Hypothyroidism (acquired) - TSH+T4F+T3Free  6. Neck pain, acute Exam revealed some enlargement of the thyroid on the right side.  She is also reporting some dysphagia in the right oropharyngeal area.  We will have her see gastroenterology for that but will also order a thyroid ultrasound.  7. Thyroid enlarged  - US THYROID; Future  8. Oropharyngeal dysphagia - Ambulatory referral to Gastroenterology  9. Chronic cough Since we have tried several measures without improvement, we will get her back in with Dr. Melvyn Novas - Ambulatory referral to Pulmonology    Patient was given the opportunity to ask questions.  Patient verbalized understanding of the plan and was able to repeat key elements of the plan.   This documentation was completed using Radio producer.  Any transcriptional errors are unintentional.  No orders of the defined types were placed in this encounter.    Requested Prescriptions    No prescriptions requested or ordered in this encounter    No follow-ups on file.  Karle Plumber, MD, FACP

## 2022-11-13 LAB — TSH+T4F+T3FREE
Free T4: 1.22 ng/dL (ref 0.82–1.77)
T3, Free: 2.7 pg/mL (ref 2.0–4.4)
TSH: 2.85 u[IU]/mL (ref 0.450–4.500)

## 2022-11-17 ENCOUNTER — Encounter: Payer: Self-pay | Admitting: *Deleted

## 2022-11-27 ENCOUNTER — Ambulatory Visit (INDEPENDENT_AMBULATORY_CARE_PROVIDER_SITE_OTHER): Payer: Medicaid Other

## 2022-11-27 ENCOUNTER — Ambulatory Visit (INDEPENDENT_AMBULATORY_CARE_PROVIDER_SITE_OTHER): Payer: Medicaid Other | Admitting: Internal Medicine

## 2022-11-27 ENCOUNTER — Encounter: Payer: Self-pay | Admitting: Internal Medicine

## 2022-11-27 VITALS — BP 134/76 | HR 58 | Temp 98.4°F | Ht 68.0 in | Wt 274.6 lb

## 2022-11-27 DIAGNOSIS — R053 Chronic cough: Secondary | ICD-10-CM

## 2022-11-27 MED ORDER — METHYLPREDNISOLONE ACETATE 80 MG/ML IJ SUSP
120.0000 mg | Freq: Once | INTRAMUSCULAR | Status: AC
  Administered 2022-11-27: 120 mg via INTRAMUSCULAR

## 2022-11-27 MED ORDER — CEFDINIR 300 MG PO CAPS: 300.0000 mg | ORAL_CAPSULE | Freq: Two times a day (BID) | ORAL | 0 refills | Status: DC

## 2022-11-27 NOTE — Patient Instructions (Signed)
Omnicef 300 mg twice daily x 10days should turn the mucus clear  Depomedrol 120 mg IM   Nexium Take 30- 60 min before your first and last meals of the day   GERD (REFLUX)  is an extremely common cause of respiratory symptoms just like yours , many times with no obvious heartburn at all.    It can be treated with medication, but also with lifestyle changes including elevation of the head of your bed (ideally 30 degrees),  Smoking cessation, avoidance of late meals, excessive alcohol, and avoid fatty foods, chocolate, peppermint, colas, red wine, and acidic juices such as orange juice.  NO MINT OR MENTHOL PRODUCTS SO NO COUGH DROPS  USE SUGARLESS CANDY INSTEAD (Jolley ranchers or Stover's or Life Savers) or even ice chips will also do - the key is to swallow to prevent all throat clearing. NO OIL BASED VITAMINS - use powdered substitutes.  Avoid fish oil when coughing.   Please remember to go to the  x-ray department  for your tests - we will call you with the results when they are available    Please schedule a follow up office visit in 6 weeks, call sooner if needed with all medications /inhalers/ solutions in hand so we can verify exactly what you are taking. This includes all medications from all doctors and over the [place] separate them into two bags:  the ones you take automatically, no matter what, vs the ones you take just when you feel you need them "BAG #2 is UP TO YOU"  - this will really help [name] help you take your medications more effectively.

## 2022-11-27 NOTE — Progress Notes (Signed)
Subjective:     Patient ID: Robin [name], female   DOB: 1955/12/25       MRN: [id number]     Brief patient profile:  83  yobf quit smoking in 1994 seen previously for dyspnea, chronic allergic rhinitis and hemoptysis (felt secondary to erosions from tonsils status post bronchoscopy) - has sensation of globus/ dysphagia dating back at least to 2009   Has seen Dr Willa Rough for pos allergies but does not recall details (even symptoms)      TESTS  PFT 02/09/17: FVC  2.35 L (77%) FEV1 2.06 L (85%) FEV1/FVC 0.88 FEF 25-75 3.32 L (142%) negative bronchodilator response TLC 3.74 L (68%) RV 71% ERV 28% DLCO corrected 71% 08/22/15: FVC 2.32 L (119%) FEV1 1.99 L (121%) FEV1/FVC 0.85 FEF 25-75 2.59 L (118%)   02/16/17:  Walked 126 meters / Baseline Sat 99%  On RA / Nadir Sat 99% on RA   METHACHOLINE CHALLENGE TEST (02/24/17):  Normal bronchial hyperreactivity.   IMAGING BARIUM SWALLOW/ESOPHAGRAM 04/30/17 (per radiologist):  Hiatal hernia with mild reflux. Esophageal dysmotility   CT NECK/SOFT TISSUE W/ CONTRAST 04/19/17 (per radiologist): No inflammatory change or foreign body can be seen in the pharynx.  Cervical spondylosis. 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   CT CHEST W/O 01/07/17   No parenchymal nodule, mass, or opacity appreciated. No pleural effusion or thickening. No pericardial effusion. No pathologic mediastinal adenopathy.   CTA CHEST 12/13/15   No pulmonary emboli. No pleural effusion or thickening. No pericardial effusion. No pathologic mediastinal adenopathy. No parenchymal nodule, mass, or opacification.   CARDIAC TTE (09/29/16): LV normal in size with moderate concentric hypertrophy. EF 55-60% with no regional wall motion abnormalities. Indeterminant diastolic function. LA & RA normal in size. RV normal in size and function. No aortic stenosis or regurgitation. Aortic root normal in size. No mitral stenosis or regurgitation. No  significant pulmonic regurgitation. No significant tricuspid regurgitation. No pericardial effusion.   LEFT HEART CATHETERIZATION (11/13/3): Aortic systolic pressure 185 Diastolic pressure 90 Left ventricular systolic pressure 181 End-diastolic pressure 27 left main coronary artery normal Left anterior descending artery normal Left circumflex coronary artery normal Right coronary artery dominant and normal  Overall estimated ejection fraction greater than 60% without focal wall motion abnormality   MICROBIOLOGY Endobronchial/Endotracheal Brushing 03/08/17: Rare Haemophilus influenza beta lactamase positive   PATHOLOGY Endobronchial/Endotracheal Brushing 03/08/17: No malignancy. Negative for herpes & HPV.   LABS 05/28/17 BNP:  6.1 ESR:  34 TSH:  1.060 Anti-CCP:  6 RF:  <10   02/16/17 IgE: 81 RAST panel: Cockroach 0.2 / D. farinae 0.2 / D. pteronyssinus 0.24    12/23/16 INR: 1.0 PTT: 24.1 Chromatin Ab:  <0.2 Smith Ab: <0.2 DS DNA Ab:  <1 SSA:  <0.2 SSB:  <0.2 Anti-CCP:  <16 SCL-70:  <0.2 ENA RNP Ab: Centromere Ab Screen:  <0.2 Jo-1 Ab:  <0.2   05/12/16 ANA: Negative Rheumatoid factor:  <10     02/08/2018 acute extended ov/Kraven Calk re:  acute on chronic cough x decades / establish with me Robin [name] pt) Chief Complaint  Patient presents with   Acute Visit    Pt c/o cough with yellow, wheezing and SOB x 3 days. She states her chest feels sore when she coughs. She has been using her albuterol inahler 3 x daily on average.   baseline = maint protonix right before first meal then before supper, and singuliar and needing saba  3 x  Times a  day but not noct then severe cough /subj  wheeze/ chest soreness 3 days prior to OV and sob with any activity  rec Prednisone 10 mg take  4 each am x 2 days,   2 each am x 2 days,  1 each am x 2 days and stop  zpak Protonix 40  Mg Take 30- 60 min before your first and last meals of the day  For drainage / throat tickle try take CHLORPHENIRAMINE  4  mg - take one every 4 hours as needed   Take delsym two tsp every 12 hours and supplement if needed with  tramadol 50 mg up to 2 every 4 hours GERD  Diet   Please schedule a follow up office visit in 4 weeks, sooner if needed  with all medications /inhalers/ solutions in hand so we can verify exactly what you are taking. This includes all medications from all doctors and over the counters    04/19/2020  f/u ov/Robin [name] re: sob/cough x 2009 / had both covid  shots Chief Complaint  Patient presents with   Follow-up  Dyspnea: 25 ft and stops due to sob / knees hurt  Cough: some worse mostly daytime/ smells trigger esp cig smoke/ sense of pnds / resolves with prednisone short term   Sleeping:  30 degrees elevation hob with electric bed does fine unless needs to get up to pee  SABA use: avg once a day  02: none   Rec See Dr Willa Rough or her group and let them know prednisone helps your symptoms Please schedule a follow up visit in 3 months with PFTs on return -  but call sooner if needed    11/27/2022  Re-establish  ov/Robin [name] re: uacs with cough since 2009    maint on gabapentin 600 tid and singulair  and ppi but not bid ac  and not seen by allergy or ent "can't remember" Chief Complaint  Patient presents with   Follow-up    Cough with mucus production.  Sore throat, sinuses feel full.  Mucus yellow and some blood mixed in.  Sx persistent for several months.  Dyspnea:  can walk at home across one room then stops due to legs give out/ sob  Cough: worse x months wakes her up about 4 am, smells will trigger/also laughing  Sleeping: electric bed slt elevated 2 pillowed  SABA use: proventil 2 x daily not sure it helps 02: none  Covid status:   vax x 2/ never infected     No obvious day to day or daytime variability or assoc   mucus plugs or hemoptysis or cp or chest tightness, subjective wheeze or overt  hb symptoms.     Also denies any obvious fluctuation of symptoms with weather or environmental  changes or other aggravating or alleviating factors except as outlined above   No unusual exposure hx or h/o childhood pna/ asthma or knowledge of premature birth.  Current Allergies, Complete Past Medical History, Past Surgical History, Family History, and Social History were reviewed in [employer] record.  ROS  The following are not active complaints unless bolded Hoarseness, sore throat, dysphagia, dental problems, itching, sneezing,  nasal congestion or discharge of excess mucus or purulent secretions, ear ache,   fever, chills, sweats, unintended wt loss or wt gain, classically pleuritic or exertional cp,  orthopnea pnd or arm/hand swelling  or leg swelling, presyncope, palpitations, abdominal pain, anorexia, nausea, vomiting, diarrhea  or change in bowel habits or change  in bladder habits, change in stools or change in urine, dysuria, hematuria,  rash, arthralgias, visual complaints, headache, numbness, weakness or ataxia or problems with walking or coordination,  change in mood or  memory.        Current Meds-  - NOTE:   Unable to verify as accurately reflecting what pt takes    Medication Sig   amLODipine (NORVASC) 10 MG tablet Take 1 tablet by mouth daily.   aspirin EC 81 MG tablet Take 81 mg by mouth at bedtime.   atorvastatin (LIPITOR) 40 MG tablet TAKE ONE TABLET BY MOUTH EVERY EVENING   azelastine (ASTELIN) 0.1 % nasal spray 1-2 sprays per nostril 2 times daily as needed   benzonatate (TESSALON) 200 MG capsule Take 1 capsule (200 mg total) by mouth 2 (two) times daily as needed for cough.   buPROPion (WELLBUTRIN XL) 150 MG 24 hr tablet Take 1 tablet (150 mg total) by mouth daily.   CALCIUM CARBONATE ANTACID PO Take 2 tablets by mouth See admin instructions. Take 2 tablets by mouth every morning, may also take 2 tablets at night as needed for acid reflux   celecoxib (CELEBREX) 100 MG capsule TAKE ONE CAPSULE BY MOUTH TWICE DAILY   CINNAMON PO Take 1 capsule by  mouth daily.   Dulaglutide (TRULICITY) 4.5 MG/0.5ML SOPN Inject 4.5 mg as directed once a week.   DULoxetine (CYMBALTA) 30 MG capsule Take 3 capsules (90 mg total) by mouth daily.   esomeprazole (NEXIUM) 40 MG capsule TAKE ONE CAPSULE BY MOUTH EVERY MORNING and TAKE ONE CAPSULE BY MOUTH EVERY EVENING   fexofenadine (ALLEGRA) 180 MG tablet Take 1 tablet (180 mg total) by mouth daily.   fluticasone (FLONASE) 50 MCG/ACT nasal spray Place 1 spray into both nostrils daily.   gabapentin (NEURONTIN) 300 MG capsule TAKE ONE CAPSULE BY MOUTH EVERY EVENING (WITH 600mg  tab FOR 900mg  DOSE)   gabapentin (NEURONTIN) 600 MG tablet TAKE ONE TABLET BY MOUTH EVERY MORNING and TAKE ONE TABLET BY MOUTH AT NOON and TAKE ONE TABLET EVERY EVENING   glucose blood (ACCU-CHEK AVIVA PLUS) test strip USE AS DIRECTED   glucose blood test strip USE AS INSTRUCTED   glucose blood test strip USE AS INSTRUCTED   hydrALAZINE (APRESOLINE) 50 MG tablet TAKE ONE TABLET BY MOUTH EVERY MORNING and TAKE ONE TABLET BY MOUTH EVERY EVENING   hydrochlorothiazide (HYDRODIURIL) 25 MG tablet TAKE ONE TABLET BY MOUTH EVERY MORNING   levothyroxine (SYNTHROID) 88 MCG tablet TAKE ONE TABLET BY MOUTH BEFORE BREAKFAST   levothyroxine (SYNTHROID) 88 MCG tablet Take 1 tablet by mouth daily.   linaclotide (LINZESS) 290 MCG CAPS capsule TAKE ONE CAPSULE BY MOUTH BEFORE BREAKFAST   losartan (COZAAR) 25 MG tablet Take 1 tablet (25 mg total) by mouth every evening.   meloxicam (MOBIC) 15 MG tablet    metFORMIN (GLUCOPHAGE) 500 MG tablet TAKE TWO TABLETS BY MOUTH EVERY MORNING and TAKE ONE TABLET BY MOUTH EVERY EVENING   metoprolol tartrate (LOPRESSOR) 50 MG tablet Take 1 tablet (50 mg total) by mouth 2 (two) times daily.   montelukast (SINGULAIR) 10 MG tablet TAKE ONE TABLET BY MOUTH EVERY EVENING   Multiple Vitamin (MULTIVITAMIN WITH MINERALS) TABS tablet Take 1 tablet by mouth daily.   NARCAN 4 MG/0.1ML LIQD nasal spray kit     NEOMYCIN-POLYMYXIN-HYDROCORTISONE (CORTISPORIN) 1 % SOLN OTIC solution Place 3 drops into the left ear 4 (four) times daily.   Omega-3 Fatty Acids (FISH OIL) 1000 MG CAPS Take 1,000  mg by mouth at bedtime.   ondansetron (ZOFRAN) 4 MG tablet Take 1 tablet (4 mg total) by mouth every 8 (eight) hours as needed for nausea or vomiting.   oxyCODONE-acetaminophen (PERCOCET) 7.5-325 MG tablet Take 1 tablet by mouth 2 (two) times daily.   Sodium Chloride-Sodium Bicarb (NETI POT SINUS WASH) 2300-700 MG KIT DO nasal rinse twice a week as needed   spironolactone (ALDACTONE) 25 MG tablet Take 1 tablet (25 mg total) by mouth every morning.   tiZANidine (ZANAFLEX) 2 MG tablet Take by mouth.   topiramate (TOPAMAX) 50 MG tablet Take 1 tablet (50 mg total) by mouth 2 (two) times daily.   VENTOLIN HFA 108 (90 Base) MCG/ACT inhaler INHALE 1-2 PUFFS BY MOUTH into THE lungs EVERY 6 HOURS AS NEEDED FOR WHEEZING AND/OR SHORTNESS OF BREATH   vitamin B-12 (CYANOCOBALAMIN) 1000 MCG tablet Take 1,000 mcg by mouth daily.               Objective:   Physical Exam   wts   11/27/2022         274  04/19/2020         335  01/15/2020         354  12/04/2019           355  05/11/2019           331  07/19/2018       328  03/08/2018            337   02/08/18 (!) 333 lb (151 kg)  01/27/18 (!) 328 lb 9.6 oz (149.1 kg)  12/14/17 (!) 326 lb 6.4 oz (148.1 kg)    Vital signs reviewed  11/27/2022  - Note at rest 02 sats  96% on RA   General appearance:   motorized  w/c bound with mint gum     HEENT : Oropharynx  clear/ no top teeth     Nasal turbinates min nonspecific edema   NECK :  without  apparent JVD/ palpable Nodes/TM    LUNGS: no acc muscle use,  Nl contour chest which is clear to A and P bilaterally without cough on insp or exp maneuvers   CV:  RRR  no s3 or murmur or increase in P2, and no edema   ABD: obese  soft and nontender    MS:   ext warm without deformities Or obvious joint restrictions  calf tenderness,  cyanosis or clubbing    SKIN: warm and dry without lesions    NEURO:  alert, approp, nl sensorium with  no motor or cerebellar deficits apparent.    CXR PA and Lateral:   11/27/2022 :    I personally reviewed images and impression is as follows:     Mod T kyphosis/ no acute dz        Assessment:

## 2022-11-28 NOTE — Assessment & Plan Note (Signed)
Onset 2009? Allergy profile 02/16/17  >  Eos 0.1 /  IgE  81 pos dust/ cockroach  - MCT 02/24/17 neg  - DgEs  01/20/18 : Small reducible hiatal hernia with trace gastroesophageal reflux. No evidence of stricture. - Cyclical cough protocol 02/08/2018  - sinus CT 03/10/2018 >>> informed  Insurance decllned> referred back to ENT Erik Obey) [id number] - worse off gabapentin 07/19/2018 > restart 300 qid  - Allergy profile 01/15/2020 >  Eos 0.1 /  IgE  39 neg RAST - 04/19/2020 reports better p prednisone > referred back to Dr [name] practice   Very strongly favor Upper airway cough syndrome (previously labeled PNDS),  is so named because it's frequently impossible to sort out how much is  CR/sinusitis with freq throat clearing (which can be related to primary GERD)   vs  causing  secondary (" extra esophageal")  GERD from wide swings in gastric pressure that occur with throat clearing, often  promoting self use of mint and menthol lozenges that reduce the lower esophageal sphincter tone and exacerbate the problem further in a cyclical fashion.   These are the same pts (now being labeled as having "irritable larynx syndrome" by some cough centers) who not infrequently have a history of having failed to tolerate ace inhibitors,  dry powder inhalers or biphosphonates or report having atypical/extraesophageal reflux symptoms that don't respond to standard doses of PPI  and are easily confused as having aecopd or asthma flares by even experienced allergists/ pulmonologists (myself included).   Rec  Depomedrol 120 mg IM  Empirical rx for sinus dz Max rx for gerd Regroup in 6 weeks with all meds in hand using a trust but verify approach to confirm accurate Medication  Reconciliation The principal here is that until we are certain that the  patients are doing what we've asked, it makes no sense to ask them to do more.

## 2022-11-30 ENCOUNTER — Ambulatory Visit (INDEPENDENT_AMBULATORY_CARE_PROVIDER_SITE_OTHER): Payer: Medicaid Other | Admitting: Clinical

## 2022-11-30 DIAGNOSIS — F341 Dysthymic disorder: Secondary | ICD-10-CM | POA: Diagnosis not present

## 2022-11-30 NOTE — Progress Notes (Unsigned)
   THERAPIST PROGRESS NOTE  Session Time: 30 minutes  Participation Level: Active  Behavioral Response: CasualAlertDepressed  Type of Therapy: Individual Therapy  Treatment Goals addressed: client will identify 3 cognitive patterns and beliefs that support depression  ProgressTowards Goals: Progressing  Interventions: CBT and Supportive  Summary:  Robin [name] is a 67 y.o. female who presents for the scheduled appointment oriented times five, appropriately dressed, and friendly. Client denied hallucinations and delusions. Client reported on today she is doing about the same. Client reported she has been feeling some worry about not feeling well. Client reported having cold like symptoms that she has not been able to get over on her own with OTC medication and from her PCP. Client reported her doctor referred her to a ear nose and throat specialist. Client reported for some time she has had worry if her husband could have something to do with her not feeling well. Client reported she is hesitant to eat when her husband cooks. Client reported she does not like to think that way but because of his mean demeanor she doesn't put anything past him. Client reported otherwise she keeps in contact with her children. Evidence of progress towards goal:  client reported 1 cognitive pattern that contributes to anxiety and depressive symptoms related to her marriage.    Suicidal/Homicidal: Nowithout intent/plan  Therapist Response:  Therapist began the appointment asking the client how she has been doing since last seen. Therapist used CBT to engage using active listening and positive emotional support. Therapist used CBT to give the client time to discuss her thoughts and concerns related to her health and her marriage.  Therapist used CBT to reinforce the client to do things in her control to help her feel comfortable at home. Therapist used CBT ask the client to identify her progress with  frequency of use with coping skills with continued practice in her daily activity.    Therapist assigned the client homework to practice self care.   Plan: Return again in 3 weeks.  Diagnosis: persistent depressive disorder  Collaboration of Care: Patient refused AEB none requested by the client.  Patient/Guardian was advised Release of Information must be obtained prior to any record release in order to collaborate their care with an outside provider. Patient/Guardian was advised if they have not already done so to contact the registration department to sign all necessary forms in order for [name] to release information regarding their care.   Consent: Patient/Guardian gives verbal consent for treatment and assignment of benefits for services provided during this visit. Patient/Guardian expressed understanding and agreed to proceed.   [place], LCSW 11/30/2022

## 2022-12-01 ENCOUNTER — Other Ambulatory Visit: Payer: Self-pay | Admitting: Internal Medicine

## 2022-12-01 ENCOUNTER — Other Ambulatory Visit: Payer: Self-pay

## 2022-12-01 DIAGNOSIS — R053 Chronic cough: Secondary | ICD-10-CM

## 2022-12-01 DIAGNOSIS — E1142 Type 2 diabetes mellitus with diabetic polyneuropathy: Secondary | ICD-10-CM

## 2022-12-01 NOTE — Telephone Encounter (Signed)
Called patient to verify medication refills current and patient reports she has "plenty" of medication

## 2022-12-01 NOTE — Telephone Encounter (Signed)
Attempted to contact [facility] to verify last dispensed med 11/11/22. On hold for extended time and unable to wait.

## 2022-12-01 NOTE — Telephone Encounter (Signed)
Requested by interface surescripts. Last refills 11/11/22. Requesting too soon. Contacted patient and patient reports she has "plenty" of medications. Future visit in 3 months Requested Prescriptions  Refused Prescriptions Disp Refills   hydrochlorothiazide (HYDRODIURIL) 25 MG tablet [Pharmacy Med Name: hydrochlorothiazide 25 mg tablet] 90 tablet 1    Sig: TAKE ONE TABLET BY MOUTH EVERY MORNING     Cardiovascular: Diuretics - Thiazide Passed - 12/01/2022  8:00 AM      Passed - Cr in normal range and within 180 days    Creat  Date Value Ref Range Status  09/22/2016 0.80 0.50 - 1.05 mg/dL Final    Comment:      For patients > or = 65 years of age: The upper reference limit for Creatinine is approximately 13% higher for people identified as African-American.      Creatinine, Ser  Date Value Ref Range Status  10/06/2022 0.96 0.57 - 1.00 mg/dL Final         Passed - K in normal range and within 180 days    Potassium  Date Value Ref Range Status  10/06/2022 CANCELED mmol/L     Comment:    Test not performed. Due to technical problems in testing, a valid result could not be obtained. The remaining specimen was not sufficient for additional testing.  Result canceled by the ancillary.   06/25/2011 4.2 mmol/L Final         Passed - Na in normal range and within 180 days    Sodium  Date Value Ref Range Status  10/06/2022 CANCELED mmol/L     Comment:    Test not performed. Due to technical problems in testing, a valid result could not be obtained. The remaining specimen was not sufficient for additional testing.  Result canceled by the ancillary.          Passed - Last BP in normal range    BP Readings from Last 1 Encounters:  11/27/22 134/76         Passed - Valid encounter within last 6 months    Recent Outpatient Visits           2 weeks ago Type 2 diabetes mellitus with morbid obesity ([place])   [place] Karle Plumber B, MD   4  months ago Type 2 diabetes mellitus with morbid obesity [facility])   [place] Karle Plumber B, MD   8 months ago Type 2 diabetes mellitus with morbid obesity [facility])   [place] Ladell Pier, MD   9 months ago Mixed stress and urge urinary incontinence   [place], MD   1 year ago Type 2 diabetes mellitus with morbid obesity [facility])   [place], MD       Future Appointments             In 2 months Melvyn Novas, Christena Deem, MD [facility] [place]   In 3 months Ladell Pier, MD [place]             celecoxib (CELEBREX) 100 MG capsule [Pharmacy Med Name: celecoxib 100 mg capsule] 180 capsule 1    Sig: TAKE ONE CAPSULE BY MOUTH TWICE DAILY     Analgesics:  COX2 Inhibitors Failed - 12/01/2022  8:00 AM  Failed - Manual Review: Labs are only required if the patient has taken medication for more than 8 weeks.      Passed - HGB in normal range and within 360 days    Hemoglobin  Date Value Ref Range Status  07/10/2022 12.4 11.1 - 15.9 g/dL Final         Passed - Cr in normal range and within 360 days    Creat  Date Value Ref Range Status  09/22/2016 0.80 0.50 - 1.05 mg/dL Final    Comment:      For patients > or = 65 years of age: The upper reference limit for Creatinine is approximately 13% higher for people identified as African-American.      Creatinine, Ser  Date Value Ref Range Status  10/06/2022 0.96 0.57 - 1.00 mg/dL Final         Passed - HCT in normal range and within 360 days    HCT  Date Value Ref Range Status  06/25/2011 41 % Final   Hematocrit  Date Value Ref Range Status  07/10/2022 38.7 34.0 - 46.6 % Final         Passed - AST in normal range and within 360 days    AST  Date Value Ref Range Status   10/06/2022 21 0 - 40 IU/L Final  06/25/2011 16 U/L Final         Passed - ALT in normal range and within 360 days    ALT  Date Value Ref Range Status  10/06/2022 16 0 - 32 IU/L Final         Passed - eGFR is 30 or above and within 360 days    GFR, Est African American  Date Value Ref Range Status  09/22/2016 >89 >=60 mL/min Final   GFR calc Af Amer  Date Value Ref Range Status  12/26/2019 >60 >60 mL/min Final   GFR, Est Non African American  Date Value Ref Range Status  09/22/2016 82 >=60 mL/min Final   GFR, Estimated  Date Value Ref Range Status  01/04/2021 >60 >60 mL/min Final    Comment:    (NOTE) Calculated using the CKD-EPI Creatinine Equation (2021)    GFR  Date Value Ref Range Status  03/08/2018 87.84 >60.00 mL/min Final   eGFR  Date Value Ref Range Status  10/06/2022 66 >59 mL/min/1.73 Final         Passed - Patient is not pregnant      Passed - Valid encounter within last 12 months    Recent Outpatient Visits           2 weeks ago Type 2 diabetes mellitus with morbid obesity ([place])   [place] Karle Plumber B, MD   4 months ago Type 2 diabetes mellitus with morbid [facility])   [place] Karle Plumber B, MD   8 months ago Type 2 diabetes mellitus with morbid obesity [facility])   [place] Ladell Pier, MD   9 months ago Mixed stress and urge urinary incontinence   [place] Karle Plumber B, MD   1 year ago Type 2 diabetes mellitus with morbid obesity [facility])   [place], MD       Future Appointments             In 2 months Wert,  Christena Deem, MD [place] [facility] [place]   In 3 months Ladell Pier, MD [place]             levothyroxine (SYNTHROID) 88 MCG tablet  [Pharmacy Med Name: levothyroxine 88 mcg tablet] 90 tablet 3    Sig: TAKE ONE TABLET BY MOUTH BEFORE BREAKFAST     Endocrinology:  Hypothyroid Agents Passed - 12/01/2022  8:00 AM      Passed - TSH in normal range and within 360 days    TSH  Date Value Ref Range Status  11/12/2022 2.850 0.450 - 4.500 uIU/mL Final         Passed - Valid encounter within last 12 months    Recent Outpatient Visits           2 weeks ago Type 2 diabetes mellitus with morbid obesity ([place])   [place] Karle Plumber B, MD   4 months ago Type 2 diabetes mellitus with morbid obesity [facility])   [place] Karle Plumber B, MD   8 months ago Type 2 diabetes mellitus with morbid obesity [facility])   [place] Ladell Pier, MD   9 months ago Mixed stress and urge urinary incontinence   [place], Deborah B, MD   1 year ago Type 2 diabetes mellitus with morbid obesity [facility])   [place], MD       Future Appointments             In 2 months Melvyn Novas, Christena Deem, MD [facility] [place]   In 3 months Ladell Pier, MD [place]             gabapentin (NEURONTIN) 600 MG tablet [Pharmacy Med Name: gabapentin 600 mg tablet] 270 tablet 1    Sig: TAKE ONE TABLET BY MOUTH THREE TIMES DAILY     Neurology: Anticonvulsants - gabapentin Passed - 12/01/2022  8:00 AM      Passed - Cr in normal range and within 360 days    Creat  Date Value Ref Range Status  09/22/2016 0.80 0.50 - 1.05 mg/dL Final    Comment:      For patients > or = 65 years of age: The upper reference limit for Creatinine is approximately 13% higher for people identified as African-American.      Creatinine, Ser  Date Value Ref Range Status  10/06/2022 0.96 0.57 -  1.00 mg/dL Final         Passed - Completed PHQ-2 or PHQ-9 in the last 360 days      Passed - Valid encounter within last 12 months    Recent Outpatient Visits           2 weeks ago Type 2 diabetes mellitus with morbid obesity [facility])   [place] Karle Plumber B, MD   4 months ago Type 2 diabetes mellitus with morbid obesity [facility])   [place] Karle Plumber B, MD   8 months ago Type 2 diabetes mellitus with morbid obesity [facility])   [place] Karle Plumber B, MD   9 months ago Mixed stress and urge urinary incontinence   [place] Karle Plumber B, MD   1 year ago Type 2 diabetes mellitus with morbid obesity [facility])   [place], MD       Future Appointments             In 2 months Melvyn Novas, Christena Deem, MD [facility] [place]   In 3 months Ladell Pier, MD [place]

## 2022-12-01 NOTE — Telephone Encounter (Deleted)
-----   Message from Tanda Rockers, MD sent at 11/30/2022 12:33 PM EST ----- Call pt:  Reviewed cxr and no acute changes to my eye - radiology wants to get f/u at next ov to be sure there are new /acute changes in the lung keeping in mind there is a limit on much a plain cxr can tell us and if not doing better will consider CT chest on return ov p re-evaluate her in person

## 2022-12-01 NOTE — Progress Notes (Deleted)
Note from Dr. Melvyn Novas - Reviewed cxr and no acute changes to my eye - radiology wants to get f/u at next ov to be sure there are new /acute changes in the lung keeping in mind there is a limit on much a plain cxr can tell us and if not doing better will consider CT chest on return ov p re-evaluate her in person. Ordering cxr

## 2022-12-01 NOTE — Telephone Encounter (Signed)
This encounter was created in error - please disregard.

## 2022-12-03 ENCOUNTER — Encounter: Payer: Self-pay | Admitting: Bariatrics

## 2022-12-03 ENCOUNTER — Ambulatory Visit (INDEPENDENT_AMBULATORY_CARE_PROVIDER_SITE_OTHER): Payer: Medicaid Other | Admitting: Bariatrics

## 2022-12-03 ENCOUNTER — Other Ambulatory Visit: Payer: Medicaid Other

## 2022-12-03 VITALS — BP 119/76 | HR 58 | Temp 97.6°F | Ht 68.0 in | Wt 265.0 lb

## 2022-12-03 DIAGNOSIS — F3289 Other specified depressive episodes: Secondary | ICD-10-CM

## 2022-12-03 DIAGNOSIS — E1169 Type 2 diabetes mellitus with other specified complication: Secondary | ICD-10-CM

## 2022-12-03 DIAGNOSIS — Z6841 Body Mass Index (BMI) 40.0 and over, adult: Secondary | ICD-10-CM

## 2022-12-03 DIAGNOSIS — Z7985 Long-term (current) use of injectable non-insulin antidiabetic drugs: Secondary | ICD-10-CM

## 2022-12-03 MED ORDER — TRULICITY 4.5 MG/0.5ML ~~LOC~~ SOAJ: 4.5000 mg | SUBCUTANEOUS | 0 refills | Status: DC

## 2022-12-03 MED ORDER — TOPIRAMATE 50 MG PO TABS: 50.0000 mg | ORAL_TABLET | Freq: Two times a day (BID) | ORAL | 0 refills | Status: DC

## 2022-12-04 ENCOUNTER — Other Ambulatory Visit: Payer: Self-pay | Admitting: Internal Medicine

## 2022-12-04 DIAGNOSIS — R7303 Prediabetes: Secondary | ICD-10-CM

## 2022-12-09 ENCOUNTER — Other Ambulatory Visit: Payer: Self-pay | Admitting: Gastroenterology

## 2022-12-09 ENCOUNTER — Other Ambulatory Visit: Payer: Self-pay | Admitting: Internal Medicine

## 2022-12-09 DIAGNOSIS — E1142 Type 2 diabetes mellitus with diabetic polyneuropathy: Secondary | ICD-10-CM

## 2022-12-09 NOTE — Telephone Encounter (Signed)
Patient no longer established here. Looks like actively seeing [facility]. Refill not approved.

## 2022-12-10 ENCOUNTER — Other Ambulatory Visit: Payer: Self-pay | Admitting: Internal Medicine

## 2022-12-10 NOTE — Telephone Encounter (Signed)
Resubmitting.    Does not look like they were transmitted to [facility].  All protocols met.  Requested Prescriptions  Pending Prescriptions Disp Refills   levothyroxine (SYNTHROID) 88 MCG tablet [Pharmacy Med Name: levothyroxine 88 mcg tablet] 90 tablet 3    Sig: TAKE ONE TABLET BY MOUTH BEFORE BREAKFAST     Endocrinology:  Hypothyroid Agents Passed - 12/09/2022  3:22 PM      Passed - TSH in normal range and within 360 days    TSH  Date Value Ref Range Status  11/12/2022 2.850 0.450 - 4.500 uIU/mL Final         Passed - Valid encounter within last 12 months    Recent Outpatient Visits           4 weeks ago Type 2 diabetes mellitus with morbid obesity ([place])   [place] Karle Plumber B, MD   5 months ago Type 2 diabetes mellitus with morbid obesity [facility])   [place] Karle Plumber B, MD   9 months ago Type 2 diabetes mellitus with morbid obesity [facility])   [place] Ladell Pier, MD   9 months ago Mixed stress and urge urinary incontinence   [place], Deborah B, MD   1 year ago Type 2 diabetes mellitus with morbid obesity [facility])   [place], MD       Future Appointments             In 1 month Melvyn Novas, Christena Deem, MD [place] [facility] [place]   In 3 months Ladell Pier, MD [place]             hydrochlorothiazide (HYDRODIURIL) 25 MG tablet [Pharmacy Med Name: hydrochlorothiazide 25 mg tablet] 90 tablet 3    Sig: TAKE ONE TABLET BY MOUTH EVERY MORNING     Cardiovascular: Diuretics - Thiazide Passed - 12/09/2022  3:22 PM      Passed - Cr in normal range and within 180 days    Creat  Date Value Ref Range Status  09/22/2016 0.80 0.50 - 1.05 mg/dL Final    Comment:      For patients > or =  65 years of age: The upper reference limit for Creatinine is approximately 13% higher for people identified as African-American.      Creatinine, Ser  Date Value Ref Range Status  10/06/2022 0.96 0.57 - 1.00 mg/dL Final         Passed - K in normal range and within 180 days    Potassium  Date Value Ref Range Status  10/06/2022 CANCELED mmol/L     Comment:    Test not performed. Due to technical problems in testing, a valid result could not be obtained. The remaining specimen was not sufficient for additional testing.  Result canceled by the ancillary.   06/25/2011 4.2 mmol/L Final         Passed - Na in normal range and within 180 days    Sodium  Date Value Ref Range Status  10/06/2022 CANCELED mmol/L     Comment:    Test not performed. Due to technical problems in testing, a valid result could not be obtained. The remaining specimen was not sufficient for additional testing.  Result canceled by the ancillary.  Passed - Last BP in normal range    BP Readings from Last 1 Encounters:  12/03/22 119/76         Passed - Valid encounter within last 6 months    Recent Outpatient Visits           4 weeks ago Type 2 diabetes mellitus with morbid obesity ([place])   [place] Karle Plumber B, MD   5 months ago Type 2 diabetes mellitus with morbid obesity [facility])   [place] Karle Plumber B, MD   9 months ago Type 2 diabetes mellitus with morbid obesity [facility])   [place] Ladell Pier, MD   9 months ago Mixed stress and urge urinary incontinence   [place], Deborah B, MD   1 year ago Type 2 diabetes mellitus with morbid obesity [facility])   [place], MD       Future Appointments             In 1 month Melvyn Novas, Christena Deem, MD [place]  [facility] [place]   In 3 months Ladell Pier, MD [place]             celecoxib (CELEBREX) 100 MG capsule [Pharmacy Med Name: celecoxib 100 mg capsule] 180 capsule 3    Sig: TAKE ONE CAPSULE BY MOUTH TWICE DAILY     Analgesics:  COX2 Inhibitors Failed - 12/09/2022  3:22 PM      Failed - Manual Review: Labs are only required if the patient has taken medication for more than 8 weeks.      Passed - HGB in normal range and within 360 days    Hemoglobin  Date Value Ref Range Status  07/10/2022 12.4 11.1 - 15.9 g/dL Final         Passed - Cr in normal range and within 360 days    Creat  Date Value Ref Range Status  09/22/2016 0.80 0.50 - 1.05 mg/dL Final    Comment:      For patients > or = 65 years of age: The upper reference limit for Creatinine is approximately 13% higher for people identified as African-American.      Creatinine, Ser  Date Value Ref Range Status  10/06/2022 0.96 0.57 - 1.00 mg/dL Final         Passed - HCT in normal range and within 360 days    HCT  Date Value Ref Range Status  06/25/2011 41 % Final   Hematocrit  Date Value Ref Range Status  07/10/2022 38.7 34.0 - 46.6 % Final         Passed - AST in normal range and within 360 days    AST  Date Value Ref Range Status  10/06/2022 21 0 - 40 IU/L Final  06/25/2011 16 U/L Final         Passed - ALT in normal range and within 360 days    ALT  Date Value Ref Range Status  10/06/2022 16 0 - 32 IU/L Final         Passed - eGFR is 30 or above and within 360 days    GFR, Est African American  Date Value Ref Range Status  09/22/2016 >89 >=60 mL/min Final   GFR calc Af Wyvonnia Lora  Date  Value Ref Range Status  12/26/2019 >60 >60 mL/min Final   GFR, Est Non African American  Date Value Ref Range Status  09/22/2016 82 >=60 mL/min Final   GFR, Estimated  Date Value Ref Range Status  01/04/2021 >60 >60 mL/min Final    Comment:     (NOTE) Calculated using the CKD-EPI Creatinine Equation (2021)    GFR  Date Value Ref Range Status  03/08/2018 87.84 >60.00 mL/min Final   eGFR  Date Value Ref Range Status  10/06/2022 66 >59 mL/min/1.73 Final         Passed - Patient is not pregnant      Passed - Valid encounter within last 12 months    Recent Outpatient Visits           4 weeks ago Type 2 diabetes mellitus with morbid obesity ([place])   [place] Karle Plumber B, MD   5 months ago Type 2 diabetes mellitus with morbid obesity [facility])   [place] Karle Plumber B, MD   9 months ago Type 2 diabetes mellitus with morbid obesity [facility])   [place] Ladell Pier, MD   9 months ago Mixed stress and urge urinary incontinence   [place], Deborah B, MD   1 year ago Type 2 diabetes mellitus with morbid obesity [facility])   [place], MD       Future Appointments             In 1 month Melvyn Novas, Christena Deem, MD [place] [facility] [place]   In 3 months Ladell Pier, MD [place]             gabapentin (NEURONTIN) 600 MG tablet [Pharmacy Med Name: gabapentin 600 mg tablet] 270 tablet 1    Sig: TAKE ONE TABLET BY [place]     Neurology: Anticonvulsants - gabapentin Passed - 12/09/2022  3:22 PM      Passed - Cr in normal range and within 360 days    Creat  Date Value Ref Range Status  09/22/2016 0.80 0.50 - 1.05 mg/dL Final    Comment:      For patients > or = 65 years of age: The upper reference limit for Creatinine is approximately 13% higher for people identified as African-American.      Creatinine, Ser  Date Value Ref Range Status  10/06/2022 0.96 0.57 - 1.00 mg/dL Final         Passed - Completed PHQ-2 or PHQ-9 in  the last 360 days      Passed - Valid encounter within last 12 months    Recent Outpatient Visits           4 weeks ago Type 2 diabetes mellitus with morbid obesity [facility])   [place] Karle Plumber B, MD   5 months ago Type 2 diabetes mellitus with morbid obesity [facility])   [place] Karle Plumber B, MD   9 months ago Type 2 diabetes mellitus with morbid obesity [facility])   [place] Ladell Pier, MD   9 months ago Mixed stress and urge urinary incontinence   [place] Ladell Pier, MD  1 year ago Type 2 diabetes mellitus with morbid obesity [facility])   [place], MD       Future Appointments             In 1 month Wert, Christena Deem, MD [place] [facility] [place]   In 3 months Ladell Pier, MD [place]

## 2022-12-11 ENCOUNTER — Other Ambulatory Visit: Payer: Self-pay | Admitting: Gastroenterology

## 2022-12-14 ENCOUNTER — Ambulatory Visit (INDEPENDENT_AMBULATORY_CARE_PROVIDER_SITE_OTHER): Payer: Medicaid Other | Admitting: Clinical

## 2022-12-14 DIAGNOSIS — F341 Dysthymic disorder: Secondary | ICD-10-CM

## 2022-12-14 NOTE — Progress Notes (Signed)
   THERAPIST PROGRESS NOTE  Session Time: 45 minutes  Participation Level: Active  Behavioral Response: CasualAlertDepressed  Type of Therapy: Individual Therapy  Treatment Goals addressed: Client will identify 3 cognitive patterns and believes that support depression  ProgressTowards Goals: Progressing  Interventions: CBT and Supportive  Summary:  Robin [name] is a 67 y.o. female who present for the scheduled appointment oriented times five, appropriately dressed, and friendly. Client denied hallucinations and delusions. Client reported on today she is not feeling well physically or emotionally. Client reported her physical symptoms of feeling like she has a cold have not gotten better. Client reported she believes a part of why she is not getting any better is because of her husband. Client reported the energy in the house is negative. Client reported when he leaves there is still a thick feeling of negativity. Client reported his energy/ negativity affects her health, sleep, and mood. Client reported she tries to not show how down she feels when he talks to her briefly. Client reported she can't talk to her sister about how she feels because she becomes defensive. Client reported she tries talking to her eldest daughter but she does not understand either. Client reported she cannot talk to the elders because when someone has a problem that person who they make the compliant about must be present also. Client reported they will not believe her concerns unless she shows proof of what her husband is doing.  Client reported she feels alone and that no one understands her point of view of where she is going through. Evidence of progress towards goal: Client reported she engages in positive coping skills which include praying and reframing negative thoughts at least 4 days out of 7 each week.  Client reported 1 believe that contributes to depression which relates to how her husband is treating  her.  Suicidal/Homicidal: Nowithout intent/plan  Therapist Response:  Therapist began the appointment asking the client how she is doing since he was last seen. Therapist used CBT to engage using active listening and positive emotional support. Therapist used CBT to give the client time to discuss her thoughts and feelings about her living situation/marriage and how it is affecting her daily. Therapist used CBT to normalize the clients emotional response and validated her thoughts and feelings. Therapist used CBT to reinforce the client progress to being engaged with spiritual practices and activities that help to keep her in a more positive emotional and mind state. Therapist used CBT ask the client to identify her progress with frequency of use with coping skills with continued practice in her daily activity.    Therapist assigned client homework to practice self-care.    Plan: Return again in 3 weeks.  Diagnosis: Persistent depressive disorder  Collaboration of Care: Patient refused AEB none requested by the client at this time.  Patient/Guardian was advised Release of Information must be obtained prior to any record release in order to collaborate their care with an outside provider. Patient/Guardian was advised if they have not already done so to contact the registration department to sign all necessary forms in order for [name] to release information regarding their care.   Consent: Patient/Guardian gives verbal consent for treatment and assignment of benefits for services provided during this visit. Patient/Guardian expressed understanding and agreed to proceed.   [place], LCSW 12/14/2022

## 2022-12-15 ENCOUNTER — Ambulatory Visit
Admission: RE | Admit: 2022-12-15 | Discharge: 2022-12-15 | Disposition: A | Discharge status: Home / Self Care | Payer: Medicaid Other | Source: Ambulatory Visit | Attending: Internal Medicine | Admitting: Internal Medicine

## 2022-12-15 DIAGNOSIS — E049 Nontoxic goiter, unspecified: Secondary | ICD-10-CM

## 2022-12-16 NOTE — Progress Notes (Unsigned)
Chief Complaint:   OBESITY Robin [name] is here to discuss her progress with her obesity treatment plan along with follow-up of her obesity related diagnoses. Robin [name] is on the Category 3 plan and states she is following her eating plan approximately 40% of the time. Robin [name] states she is doing chair exercises for 30-45 minutes 3-4 times per week.  Today's visit was #: 20 Starting weight: 310 lbs Starting date: 06/03/21 Today's weight: 265 lbs Today's date: 12/03/22 Total lbs lost to date: 45 Total lbs lost since last in-office visit: -3  Interim History: She is down 3 pounds since her last visit.  Subjective:   1. Diabetes mellitus type 2 in obese (HCC) Fasting blood sugar greater: Occasional lows-down to 69.  2. Other depression, with emotional eating binges Taking Topamax as directed.   Assessment/Plan:   1. Diabetes mellitus type 2 in obese (HCC) Refill- - Dulaglutide (TRULICITY) 4.5 0000000 SOPN; Inject 4.5 mg as directed once a week.  Dispense: 2 mL; Refill: 0  2. Other depression, with emotional eating binges Refill- - topiramate (TOPAMAX) 50 MG tablet; Take 1 tablet (50 mg total) by mouth 2 (two) times daily.  Dispense: 60 tablet; Refill: 0  3. Morbid obesity ([place]) BMI 40.0-44.9, adult ([place]) 1.  Meal planning. 2.  Mindful eating. 3.  Discussed good snacks. 4.  Low-carb snacks.  Robin [name] is currently in the action stage of change. As such, her goal is to continue with weight loss efforts. She has agreed to the Category 3 plan.  Exercise goals: As is  Behavioral modification strategies: increasing lean protein intake, decreasing simple carbohydrates, increasing vegetables, increasing water intake, decreasing eating out, no skipping meals, meal planning and cooking strategies, keeping healthy foods in the home, and planning for success.  Robin [name] has agreed to follow-up with our clinic in 4 weeks. She was informed of the importance of frequent follow-up visits to  maximize her success with intensive lifestyle modifications for her multiple health conditions.    Objective:   Blood pressure 119/76, pulse (!) 58, temperature 97.6 F (36.4 C), height '5\' 8"'$  (1.727 m), weight 265 lb (120.2 kg), SpO2 100 %. Body mass index is 40.29 kg/m.  General: Cooperative, alert, well developed, in no acute distress. HEENT: Conjunctivae and lids unremarkable. Cardiovascular: Regular rhythm.  Lungs: Normal work of breathing. Neurologic: No focal deficits.   Lab Results  Component Value Date   CREATININE 0.96 10/06/2022   BUN 16 10/06/2022   NA CANCELED 10/06/2022   K CANCELED 10/06/2022   CL CANCELED 10/06/2022   CO2 22 10/06/2022   Lab Results  Component Value Date   ALT 16 10/06/2022   AST 21 10/06/2022   ALKPHOS 74 10/06/2022   BILITOT 0.4 10/06/2022   Lab Results  Component Value Date   HGBA1C 5.3 10/06/2022   HGBA1C 6.3 07/10/2022   HGBA1C 5.3 03/10/2022   HGBA1C 5.7 (H) 11/17/2021   HGBA1C 5.7 11/10/2021   Lab Results  Component Value Date   INSULIN 28.2 (H) 11/17/2021   INSULIN 48.4 (H) 06/03/2021   Lab Results  Component Value Date   TSH 2.850 11/12/2022   Lab Results  Component Value Date   CHOL 140 10/06/2022   HDL 58 10/06/2022   LDLCALC 68 10/06/2022   TRIG 71 10/06/2022   CHOLHDL 2.2 07/10/2022   Lab Results  Component Value Date   VD25OH 37.7 10/06/2022   VD25OH 61.7 06/03/2021   Lab Results  Component Value Date   WBC 7.7  07/10/2022   HGB 12.4 07/10/2022   HCT 38.7 07/10/2022   MCV 91 07/10/2022   PLT 365 07/10/2022   Lab Results  Component Value Date   IRON 31 03/04/2015   TIBC 340 03/04/2015   FERRITIN 34 03/04/2015    Attestation Statements:   Reviewed by clinician on day of visit: allergies, medications, problem list, medical history, surgical history, family history, social history, and previous encounter notes.  I, Dawn Whitmire, FNP-C, am acting as transcriptionist for Dr. Jearld Lesch.  I have  reviewed the above documentation for accuracy and completeness, and I agree with the above. Jearld Lesch, DO

## 2022-12-29 ENCOUNTER — Other Ambulatory Visit: Payer: Self-pay | Admitting: Internal Medicine

## 2022-12-29 DIAGNOSIS — Z1231 Encounter for screening mammogram for malignant neoplasm of breast: Secondary | ICD-10-CM

## 2022-12-30 ENCOUNTER — Ambulatory Visit (INDEPENDENT_AMBULATORY_CARE_PROVIDER_SITE_OTHER): Payer: Medicaid Other | Admitting: Physician Assistant

## 2022-12-30 ENCOUNTER — Encounter (HOSPITAL_COMMUNITY): Payer: Self-pay | Admitting: Physician Assistant

## 2022-12-30 VITALS — BP 122/63 | HR 54 | Temp 98.3°F | Ht 68.0 in | Wt 270.0 lb

## 2022-12-30 DIAGNOSIS — F411 Generalized anxiety disorder: Secondary | ICD-10-CM | POA: Diagnosis not present

## 2022-12-30 DIAGNOSIS — F341 Dysthymic disorder: Secondary | ICD-10-CM

## 2022-12-30 MED ORDER — BUPROPION HCL ER (XL) 300 MG PO TB24: 300.0000 mg | ORAL_TABLET | Freq: Every day | ORAL | 2 refills | Status: DC

## 2022-12-30 MED ORDER — DULOXETINE HCL 30 MG PO CPEP: 90.0000 mg | ORAL_CAPSULE | Freq: Every day | ORAL | 2 refills | Status: DC
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
## 2022-12-31 ENCOUNTER — Ambulatory Visit (INDEPENDENT_AMBULATORY_CARE_PROVIDER_SITE_OTHER): Payer: Medicaid Other | Admitting: Bariatrics

## 2022-12-31 ENCOUNTER — Encounter: Payer: Self-pay | Admitting: Bariatrics

## 2022-12-31 VITALS — BP 133/74 | HR 54 | Temp 97.8°F | Ht 68.0 in | Wt 270.0 lb

## 2022-12-31 DIAGNOSIS — E1169 Type 2 diabetes mellitus with other specified complication: Secondary | ICD-10-CM | POA: Diagnosis not present

## 2022-12-31 DIAGNOSIS — Z6841 Body Mass Index (BMI) 40.0 and over, adult: Secondary | ICD-10-CM

## 2022-12-31 DIAGNOSIS — E669 Obesity, unspecified: Secondary | ICD-10-CM

## 2022-12-31 DIAGNOSIS — F3289 Other specified depressive episodes: Secondary | ICD-10-CM | POA: Diagnosis not present

## 2022-12-31 DIAGNOSIS — Z7985 Long-term (current) use of injectable non-insulin antidiabetic drugs: Secondary | ICD-10-CM

## 2022-12-31 MED ORDER — TOPIRAMATE 50 MG PO TABS: 50.0000 mg | ORAL_TABLET | Freq: Two times a day (BID) | ORAL | 0 refills | Status: DC

## 2022-12-31 MED ORDER — TRULICITY 4.5 MG/0.5ML ~~LOC~~ SOAJ: 4.5000 mg | SUBCUTANEOUS | 0 refills | Status: DC

## 2023-01-05 ENCOUNTER — Ambulatory Visit (INDEPENDENT_AMBULATORY_CARE_PROVIDER_SITE_OTHER): Payer: Medicaid Other | Admitting: Clinical

## 2023-01-05 DIAGNOSIS — F341 Dysthymic disorder: Secondary | ICD-10-CM

## 2023-01-05 NOTE — Progress Notes (Unsigned)
Chief Complaint:   OBESITY Robin [name] is here to discuss her progress with her obesity treatment plan along with follow-up of her obesity related diagnoses. Robin [name] is on the Category 3 Plan and states she is following her eating plan approximately 30% of the time. Robin [name] states she is chair exercises for 30-40 minutes 4 times per week.  Today's visit was #: 21 Starting weight: 310 LBS Starting date: 06/03/2021 Today's weight: 270 LBS Today's date: 12/31/2022 Total lbs lost to date: 40 LBS Total lbs lost since last in-office visit: + 5 LBS  Interim History: Patient is 5 LBS since her last visit.  She has been depressed per patient.  She has issues with her spouse.  Increased stress eating.  Subjective:   1. Diabetes mellitus type 2 in obese Robin [name]) Patient is taking medication as directed.  No side effects noted.  2. Other depression [name]. Wellbutrin was increased yesterday.  3. Other depression, with emotional eating binges Patient is taking medication as directed for emotional eating.  Assessment/Plan:   1. Diabetes mellitus type 2 in obese (HCC) Refill- Dulaglutide (TRULICITY) 4.5 0000000 SOPN; Inject 4.5 mg as directed once a week.  Dispense: 2 mL; Refill: 0  2. Other depression Continue to follow-up with a therapist.  Continue medication.  3. Other depression, with emotional eating binges Refill- topiramate (TOPAMAX) 50 MG tablet; Take 1 tablet (50 mg total) by mouth 2 (two) times daily.  Dispense: 60 tablet; Refill: 0  4. Morbid obesity (Robin [name])  5. Obesity,current BMI 41.2 Robin [name] is currently in the action stage of change. As such, her goal is to continue with weight loss efforts. She has agreed to the Category 3 Plan.  1.  Meal planning. 2.  Intentional eating.  Exercise goals: All adults should avoid inactivity. Some physical activity is better than none, and adults who participate in any amount of physical  activity gain some health benefits.  Behavioral modification strategies: increasing lean protein intake, decreasing simple carbohydrates, increasing vegetables, increasing water intake, decreasing eating out, no skipping meals, meal planning and cooking strategies, keeping healthy foods in the home, and planning for success.  Robin [name] has agreed to follow-up with our clinic in 4 weeks. She was informed of the importance of frequent follow-up visits to maximize her success with intensive lifestyle modifications for her multiple health conditions.   Objective:   Blood pressure 133/74, pulse (!) 54, temperature 97.8 F (36.6 C), height 5\' 8"  (1.727 m), weight 270 lb (122.5 kg), SpO2 100 %. Body mass index is 41.05 kg/m.  General: Cooperative, alert, well developed, in no acute distress. HEENT: Conjunctivae and lids unremarkable. Cardiovascular: Regular rhythm.  Lungs: Normal work of breathing. Neurologic: No focal deficits.   Lab Results  Component Value Date   CREATININE 0.96 10/06/2022   BUN 16 10/06/2022   NA CANCELED 10/06/2022   K CANCELED 10/06/2022   CL CANCELED 10/06/2022   CO2 22 10/06/2022   Lab Results  Component Value Date   ALT 16 10/06/2022   AST 21 10/06/2022   ALKPHOS 74 10/06/2022   BILITOT 0.4 10/06/2022   Lab Results  Component Value Date   HGBA1C 5.3 10/06/2022   HGBA1C 6.3 07/10/2022   HGBA1C 5.3 03/10/2022   HGBA1C 5.7 (H) 11/17/2021   HGBA1C 5.7 11/10/2021   Lab Results  Component Value Date   INSULIN 28.2 (H) 11/17/2021   INSULIN 48.4 (H) 06/03/2021   Lab Results  Component Value  Date   TSH 2.850 11/12/2022   Lab Results  Component Value Date   CHOL 140 10/06/2022   HDL 58 10/06/2022   LDLCALC 68 10/06/2022   TRIG 71 10/06/2022   CHOLHDL 2.2 07/10/2022   Lab Results  Component Value Date   VD25OH 37.7 10/06/2022   VD25OH 61.7 06/03/2021   Lab Results  Component Value Date   WBC 7.7 07/10/2022   HGB 12.4 07/10/2022   HCT 38.7  07/10/2022   MCV 91 07/10/2022   PLT 365 07/10/2022   Lab Results  Component Value Date   IRON 31 03/04/2015   TIBC 340 03/04/2015   FERRITIN 34 03/04/2015   Attestation Statements:   Reviewed by clinician on day of visit: allergies, medications, problem list, medical history, surgical history, family history, social history, and previous encounter notes.  Delfina Redwood, am acting as Location manager for [employer], DO.  I have reviewed the above documentation for accuracy and completeness, and I agree with the above. Jearld Lesch, DO
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
## 2023-01-06 ENCOUNTER — Encounter: Payer: Self-pay | Admitting: Bariatrics

## 2023-01-07 ENCOUNTER — Other Ambulatory Visit: Payer: Self-pay | Admitting: Gastroenterology

## 2023-01-15 ENCOUNTER — Ambulatory Visit (INDEPENDENT_AMBULATORY_CARE_PROVIDER_SITE_OTHER): Payer: Medicaid Other | Admitting: Podiatry

## 2023-01-15 ENCOUNTER — Encounter: Payer: Self-pay | Admitting: Podiatry

## 2023-01-15 VITALS — BP 147/77

## 2023-01-15 DIAGNOSIS — L84 Corns and callosities: Secondary | ICD-10-CM | POA: Diagnosis not present

## 2023-01-15 DIAGNOSIS — E1142 Type 2 diabetes mellitus with diabetic polyneuropathy: Secondary | ICD-10-CM | POA: Diagnosis not present

## 2023-01-15 DIAGNOSIS — M79675 Pain in left toe(s): Secondary | ICD-10-CM | POA: Diagnosis not present

## 2023-01-15 DIAGNOSIS — B351 Tinea unguium: Secondary | ICD-10-CM

## 2023-01-15 DIAGNOSIS — M79674 Pain in right toe(s): Secondary | ICD-10-CM

## 2023-01-15 NOTE — Progress Notes (Unsigned)
  Subjective:  Patient ID: Robin [name], female    DOB: [birth year],  MRN: [id number]  Robin [name] presents to clinic today for {jgcomplaint:23593}  Chief Complaint  Patient presents with   Nail Problem    [facility] BS-92 A1C-5.7 PCP-Deborah Johnson PCP VST-11/2022   New problem(s): None. {jgcomplaint:23593}  PCP is Marcine Matar, MD.  Allergies  Allergen Reactions   Amlodipine Besylate Other (See Comments)    Edema in lower extremities Other reaction(s): Other Edema in lower extremities   Celexa [Citalopram Hydrobromide] Other (See Comments)    "Made me feel out of it"   Gabapentin Other (See Comments)    Contributed to lower extremity edema   Diflucan [Fluconazole] Rash    Review of Systems: Negative except as noted in the HPI.  Objective: No changes noted in today's physical examination. Vitals:   01/15/23 1048  BP: (!) 147/77   Robin [name] is a pleasant 67 y.o. female {jgbodyhabitus:24098} AAO x 3.  Vascular Examination: Capillary refill time to digits immediate b/l. Palpable DP pulse(s) b/l LE. Palpable PT pulse(s) b/l LE. Pedal hair sparse. No pain with calf compression b/l. Lower extremity skin temperature gradient within normal limits. Trace edema noted BLE. No cyanosis or clubbing noted b/l LE.  Dermatological Examination: Pedal skin is warm and supple b/l LE. No open wounds b/l LE. No interdigital macerations noted b/l LE.   Toenails 1-5 b/l elongated, discolored, dystrophic, thickened, crumbly with subungual debris and tenderness to dorsal palpation.  Neurological Examination: Pt has subjective symptoms of neuropathy. Protective sensation intact 5/5 intact bilaterally with 10g monofilament b/l. Vibratory sensation intact b/l.  Musculoskeletal Examination: Muscle strength 5/5 to all lower extremity muscle groups bilaterally. No pain, crepitus or joint limitation noted with ROM bilateral LE. Utilizes motorized chair for mobility  assistance.  Assessment/Plan: 1. Pain due to onychomycosis of toenails of both feet   2. Diabetic peripheral neuropathy associated with type 2 diabetes mellitus     No orders of the defined types were placed in this encounter.   None {Jgplan:23602::"-Patient/POA to call should there be question/concern in the interim."}   Return in about 3 months (around 04/16/2023).  Freddie Breech, DPM

## 2023-01-25 ENCOUNTER — Ambulatory Visit (INDEPENDENT_AMBULATORY_CARE_PROVIDER_SITE_OTHER): Payer: Medicaid Other | Admitting: Clinical

## 2023-01-25 DIAGNOSIS — F341 Dysthymic disorder: Secondary | ICD-10-CM | POA: Diagnosis not present

## 2023-02-01 ENCOUNTER — Ambulatory Visit: Payer: Medicaid Other | Admitting: Internal Medicine
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
## 2023-02-01 NOTE — Progress Notes (Signed)
Subjective:     Patient ID: Robin [name], female   DOB: [birth year]       MRN: [id number]     Brief patient profile:  50 yobf quit smoking in 1994 seen previously for dyspnea, chronic allergic rhinitis and hemoptysis (felt secondary to erosions from tonsils status post bronchoscopy) - has sensation of globus/ dysphagia dating back at least to 2009   Has seen Robin [name] for pos allergies but does not recall details (even symptoms)      TESTS  PFT 02/09/17: FVC  2.35 L (77%) FEV1 2.06 L (85%) FEV1/FVC 0.88 FEF 25-75 3.32 L (142%) negative bronchodilator response TLC 3.74 L (68%) RV 71% ERV 28% DLCO corrected 71% 08/22/15: FVC 2.32 L (119%) FEV1 1.99 L (121%) FEV1/FVC 0.85 FEF 25-75 2.59 L (118%)   02/16/17:  Walked 126 meters / Baseline Sat 99%  On RA / Nadir Sat 99% on RA   METHACHOLINE CHALLENGE TEST (02/24/17):  Normal bronchial hyperreactivity.   IMAGING BARIUM SWALLOW/ESOPHAGRAM 04/30/17 (per radiologist):  Hiatal hernia with mild reflux. Esophageal dysmotility   CT NECK/SOFT TISSUE W/ CONTRAST 04/19/17 (per radiologist): No inflammatory change or foreign body can be seen in the pharynx.  Cervical spondylosis. 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   CT CHEST W/O 01/07/17   No parenchymal nodule, mass, or opacity appreciated. No pleural effusion or thickening. No pericardial effusion. No pathologic mediastinal adenopathy.   CTA CHEST 12/13/15   No pulmonary emboli. No pleural effusion or thickening. No pericardial effusion. No pathologic mediastinal adenopathy. No parenchymal nodule, mass, or opacification.   CARDIAC TTE (09/29/16): LV normal in size with moderate concentric hypertrophy. EF 55-60% with no regional wall motion abnormalities. Indeterminant diastolic function. LA & RA normal in size. RV normal in size and function. No aortic stenosis or regurgitation. Aortic root normal in size. No mitral stenosis or regurgitation. No  significant pulmonic regurgitation. No significant tricuspid regurgitation. No pericardial effusion.   LEFT HEART CATHETERIZATION (11/13/3): Aortic systolic pressure 185 Diastolic pressure 90 Left ventricular systolic pressure 181 End-diastolic pressure 27 left main coronary artery normal Left anterior descending artery normal Left circumflex coronary artery normal Right coronary artery dominant and normal  Overall estimated ejection fraction greater than 60% without focal wall motion abnormality   MICROBIOLOGY Endobronchial/Endotracheal Brushing 03/08/17: Rare Haemophilus influenza beta lactamase positive   PATHOLOGY Endobronchial/Endotracheal Brushing 03/08/17: No malignancy. Negative for herpes & HPV.   LABS 05/28/17 BNP:  6.1 ESR:  34 TSH:  1.060 Anti-CCP:  6 RF:  <10   02/16/17 IgE: 81 RAST panel: Cockroach 0.2 / D. farinae 0.2 / D. pteronyssinus 0.24    12/23/16 INR: 1.0 PTT: 24.1 Chromatin Ab:  <0.2 Smith Ab: <0.2 DS DNA Ab:  <1 SSA:  <0.2 SSB:  <0.2 Anti-CCP:  <16 SCL-70:  <0.2 ENA RNP Ab: Centromere Ab Screen:  <0.2 Jo-1 Ab:  <0.2   05/12/16 ANA: Negative Rheumatoid factor:  <10     02/08/2018 acute extended ov/Robin [name] re:  acute on chronic cough x decades / establish with me Robin [name] pt) Chief Complaint  Patient presents with   Acute Visit    Pt c/o cough with yellow, wheezing and SOB x 3 days. She states her chest feels sore when she coughs. She has been using her albuterol inahler 3 x daily on average.   baseline = maint protonix right before first meal then before supper, and singuliar and needing saba  3 x  Times a day  but not noct then severe cough /subj  wheeze/ chest soreness 3 days prior to OV and sob with any activity  rec Prednisone 10 mg take  4 each am x 2 days,   2 each am x 2 days,  1 each am x 2 days and stop  zpak Protonix 40  Mg Take 30- 60 min before your first and last meals of the day  For drainage / throat tickle try take CHLORPHENIRAMINE  4  mg - take one every 4 hours as needed   Take delsym two tsp every 12 hours and supplement if needed with  tramadol 50 mg up to 2 every 4 hours GERD  Diet   Please schedule a follow up office visit in 4 weeks, sooner if needed  with all medications /inhalers/ solutions in hand so we can verify exactly what you are taking. This includes all medications from all doctors and over the counters    04/19/2020  f/u ov/Robin [name] re: sob/cough x 2009 / had both covid  shots Chief Complaint  Patient presents with   Follow-up  Dyspnea: 25 ft and stops due to sob / knees hurt  Cough: some worse mostly daytime/ smells trigger esp cig smoke/ sense of pnds / resolves with prednisone short term   Sleeping:  30 degrees elevation hob with electric bed does fine unless needs to get up to pee  SABA use: avg once a day  02: none   Rec See Robin [name] and let them know prednisone helps your symptoms Please schedule a follow up visit in 3 months with PFTs on return -  but call sooner if needed    11/27/2022  Re-establish  ov/Robin [name] re: uacs with cough since 2009    maint on gabapentin 600 tid and singulair  and ppi but not bid ac  and not seen by allergy or ent "can't remember" Chief Complaint  Patient presents with   Follow-up    Cough with mucus production.  Sore throat, sinuses feel full.  Mucus yellow and some blood mixed in.  Sx persistent for several months.  Dyspnea:  can walk at home across one room then stops due to legs give out/ sob  Cough: worse x months wakes her up about 4 am, smells will trigger/also laughing  Sleeping: electric bed slt elevated 2 pillowed  SABA use: proventil 2 x daily not sure it helps 02: none  Covid status:   vax x 2/ never infected Rec Omnicef 300 mg twice daily x 10 days should turn the mucus clear Depomedrol 120 mg IM  Nexium Take 30- 60 min before your first and last meals of the day  GERD diet reviewed, bed blocks rec   Please schedule a follow up office visit  in 6 weeks, call sooner if needed with all medications /inhalers/ solutions in hand    02/02/2023  f/u ov/Robin [name] re: cough x 2019 at least [employer] said decades)  maint on nexium  40 mg bid ac  did not bring meds  and gabapentin 600 mg tid  Chief Complaint  Patient presents with   Follow-up    Cough with thick white mucus.  Sx x several months.  CXR today  Dyspnea:  room to room/ uses scooter to go out  Cough: mucus is clear / cleaning products make her feel choked  Sleeping: feels choked early in am/ electric bed slt elevated  SABA use: some 02: none    No obvious day to  day or daytime variability or assoc excess/ purulent sputum or mucus plugs or hemoptysis or cp or chest tightness, subjective wheeze or overt sinus or hb symptoms.    Also denies any obvious fluctuation of symptoms with weather or environmental changes or other aggravating or alleviating factors except as outlined above   No unusual exposure hx or h/o childhood pna/ asthma or knowledge of premature birth.  Current Allergies, Complete Past Medical History, Past Surgical History, Family History, and Social History were reviewed in [employer] record.  ROS  The following are not active complaints unless bolded Hoarseness, sore throat, dysphagia= globus, dental problems, itching, sneezing,  nasal congestion or discharge of excess mucus or purulent secretions, ear ache,   fever, chills, sweats, unintended wt loss or wt gain, classically pleuritic or exertional cp,  orthopnea pnd or arm/hand swelling  or leg swelling, presyncope, palpitations, abdominal pain, anorexia, nausea, vomiting, diarrhea  or change in bowel habits or change in bladder habits, change in stools or change in urine, dysuria, hematuria,  rash, arthralgias, visual complaints, headache, numbness, weakness or ataxia or problems with walking or coordination,  change in mood or  memory.        Current Meds - - NOTE:   Unable to verify as  accurately reflecting what pt takes    Medication Sig   ACCU-CHEK AVIVA PLUS test strip USE TO check blood glucose daily AS DIRECTED   amLODipine (NORVASC) 10 MG tablet Take 1 tablet by mouth daily.   aspirin EC 81 MG tablet Take 81 mg by mouth at bedtime.   atorvastatin (LIPITOR) 40 MG tablet TAKE ONE TABLET BY MOUTH EVERY EVENING   azelastine (ASTELIN) 0.1 % nasal spray 1-2 sprays per nostril 2 times daily as needed   benzonatate (TESSALON) 200 MG capsule Take 1 capsule (200 mg total) by mouth 2 (two) times daily as needed for cough.   buPROPion (WELLBUTRIN XL) 300 MG 24 hr tablet Take 1 tablet (300 mg total) by mouth daily.   CALCIUM CARBONATE ANTACID PO Take 2 tablets by mouth See admin instructions. Take 2 tablets by mouth every morning, may also take 2 tablets at night as needed for acid reflux   cefdinir (OMNICEF) 300 MG capsule Take 1 capsule (300 mg total) by mouth 2 (two) times daily.   celecoxib (CELEBREX) 100 MG capsule TAKE ONE CAPSULE BY MOUTH TWICE DAILY   CINNAMON PO Take 1 capsule by mouth daily.   Dulaglutide (TRULICITY) 4.5 MG/0.5ML SOPN Inject 4.5 mg as directed once a week.   DULoxetine (CYMBALTA) 30 MG capsule Take 3 capsules (90 mg total) by mouth daily.   esomeprazole (NEXIUM) 40 MG capsule TAKE ONE CAPSULE BY MOUTH TWICE DAILY   fexofenadine (ALLEGRA) 180 MG tablet Take 1 tablet (180 mg total) by mouth daily.   fluticasone (FLONASE) 50 MCG/ACT nasal spray Place 1 spray into both nostrils daily.   gabapentin (NEURONTIN) 300 MG capsule TAKE ONE CAPSULE BY MOUTH EVERY EVENING (WITH 600mg  tab FOR 900mg  DOSE)   gabapentin (NEURONTIN) 600 MG tablet TAKE ONE TABLET BY MOUTH THREE TIMES DAILY   hydrALAZINE (APRESOLINE) 50 MG tablet TAKE ONE TABLET BY MOUTH EVERY MORNING and TAKE ONE TABLET BY MOUTH EVERY EVENING   hydrochlorothiazide (HYDRODIURIL) 25 MG tablet TAKE ONE TABLET BY MOUTH EVERY MORNING   levothyroxine (SYNTHROID) 88 MCG tablet Take 1 tablet by mouth daily.    levothyroxine (SYNTHROID) 88 MCG tablet TAKE ONE TABLET BY MOUTH BEFORE BREAKFAST   linaclotide (LINZESS)  290 MCG CAPS capsule TAKE ONE CAPSULE BY MOUTH BEFORE BREAKFAST   losartan (COZAAR) 25 MG tablet Take 1 tablet (25 mg total) by mouth every evening.   meloxicam (MOBIC) 15 MG tablet    metFORMIN (GLUCOPHAGE) 500 MG tablet TAKE TWO TABLETS BY MOUTH EVERY MORNING and TAKE ONE TABLET BY MOUTH EVERY EVENING   metoprolol tartrate (LOPRESSOR) 50 MG tablet Take 1 tablet (50 mg total) by mouth 2 (two) times daily.   montelukast (SINGULAIR) 10 MG tablet TAKE ONE TABLET BY MOUTH EVERY EVENING   Multiple Vitamin (MULTIVITAMIN WITH MINERALS) TABS tablet Take 1 tablet by mouth daily.   NARCAN 4 MG/0.1ML LIQD nasal spray kit    NEOMYCIN-POLYMYXIN-HYDROCORTISONE (CORTISPORIN) 1 % SOLN OTIC solution Place 3 drops into the left ear 4 (four) times daily.   Omega-3 Fatty Acids (FISH OIL) 1000 MG CAPS Take 1,000 mg by mouth at bedtime.   ondansetron (ZOFRAN) 4 MG tablet Take 1 tablet (4 mg total) by mouth every 8 (eight) hours as needed for nausea or vomiting.   oxyCODONE-acetaminophen (PERCOCET) 7.5-325 MG tablet Take 1 tablet by mouth 2 (two) times daily.   Sodium Chloride-Sodium Bicarb (NETI POT SINUS WASH) 2300-700 MG KIT DO nasal rinse twice a week as needed   spironolactone (ALDACTONE) 25 MG tablet Take 1 tablet (25 mg total) by mouth every morning.   tiZANidine (ZANAFLEX) 2 MG tablet Take by mouth.   topiramate (TOPAMAX) 50 MG tablet Take 1 tablet (50 mg total) by mouth 2 (two) times daily.   VENTOLIN HFA 108 (90 Base) MCG/ACT inhaler INHALE 1-2 PUFFS BY MOUTH into THE lungs EVERY 6 HOURS AS NEEDED FOR WHEEZING AND/OR SHORTNESS OF BREATH   vitamin B-12 (CYANOCOBALAMIN) 1000 MCG tablet Take 1,000 mcg by mouth daily.               Objective:   Physical Exam   wts   02/02/2023         276  11/27/2022         274  04/19/2020         335  01/15/2020         354  12/04/2019           355  05/11/2019            331  07/19/2018       328  03/08/2018            337   02/08/18 (!) 333 lb (151 kg)  01/27/18 (!) 328 lb 9.6 oz (149.1 kg)  12/14/17 (!) 326 lb 6.4 oz (148.1 kg)    Vital signs reviewed  02/02/2023  - Note at rest 02 sats  98% on RA   General appearance:    scooter bound elderly bf mild voice fatigue     HEENT : Oropharynx  clear     Nasal turbinates nl   NECK :  without  apparent JVD/ palpable Nodes/TM    LUNGS: no acc muscle use,  Nl contour chest which is clear to A and P bilaterally without cough on insp or exp maneuvers   CV:  RRR  no s3 or murmur or increase in P2, and no edema   ABD: quite obese  soft and nontender   MS:  ext warm without deformities Or obvious joint restrictions  calf tenderness, cyanosis or clubbing    SKIN: warm and dry without lesions    NEURO:  alert, approp, nl sensorium with  no motor or  cerebellar deficits apparent.      CXR PA and Lateral:   02/02/2023 :    I personally reviewed images and impression is as follows:     No as dz        Assessment:

## 2023-02-02 ENCOUNTER — Ambulatory Visit (INDEPENDENT_AMBULATORY_CARE_PROVIDER_SITE_OTHER): Payer: Medicaid Other | Admitting: Internal Medicine

## 2023-02-02 ENCOUNTER — Encounter: Payer: Self-pay | Admitting: Internal Medicine

## 2023-02-02 ENCOUNTER — Ambulatory Visit (INDEPENDENT_AMBULATORY_CARE_PROVIDER_SITE_OTHER): Payer: Medicaid Other

## 2023-02-02 VITALS — BP 104/60 | HR 67 | Temp 99.0°F | Ht 68.0 in | Wt 276.0 lb

## 2023-02-02 DIAGNOSIS — R053 Chronic cough: Secondary | ICD-10-CM

## 2023-02-02 NOTE — Patient Instructions (Addendum)
For drainage / throat tickle try take CHLORPHENIRAMINE  4 mg  ("Allergy Relief" 4mg   at [facility] should be easiest to find in the blue box usually on bottom shelf)  take one every 4 hours as needed - extremely effective and inexpensive over the counter- may cause drowsiness so start with just a dose or two an hour before bedtime and see how you tolerate it before trying in daytime.    GERD (REFLUX)  is an extremely common cause of respiratory symptoms just like yours , many times with no obvious heartburn at all.    It can be treated with medication, but also with lifestyle changes including elevation of the head of your bed (ideally with 6 -8inch blocks under the headboard of your bed),  Smoking cessation, avoidance of late meals, excessive alcohol, and avoid fatty foods, chocolate, peppermint, colas, red wine, and acidic juices such as orange juice.  NO MINT OR MENTHOL PRODUCTS SO NO COUGH DROPS  USE SUGARLESS CANDY INSTEAD (Jolley ranchers or Stover's or Life Savers) or even ice chips will also do - the key is to swallow to prevent all throat clearing. NO OIL BASED VITAMINS - use powdered substitutes.  Avoid fish oil when coughing.   My office will be contacting you by phone for referral to ENT at [facility]/ Dr Laban Emperor group [facility]   - if you don't hear back from my office within one week please call us back or notify us thru MyChart and we'll address it right away.    Follow up here is as needed with all medications in hand

## 2023-02-03 ENCOUNTER — Encounter: Payer: Self-pay | Admitting: Internal Medicine

## 2023-02-03 NOTE — Assessment & Plan Note (Signed)
Onset 2009? Allergy profile 02/16/17  >  Eos 0.1 /  IgE  81 pos dust/ cockroach  - MCT 02/24/17 neg  - DgEs  01/20/18 : Small reducible hiatal hernia with trace gastroesophageal reflux. No evidence of stricture. 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   These are the same pts (now being labeled as having "irritable larynx syndrome" by some cough centers) who not infrequently have a history of having failed to tolerate ace inhibitors,  dry powder inhalers or biphosphonates or report having atypical/extraesophageal reflux symptoms that don't respond to standard doses of PPI  and are easily confused as having aecopd or asthma flares by even experienced allergists/ pulmonologists (myself included).   Nothing else to offer in pulmonary clinic at this point > referred to ENT   Advised if does need to return to this clinic she'll need to meet [name] half way = bring  all meds in hand using a trust but verify approach to confirm accurate Medication  Reconciliation The principal here is that until we are certain that the  patients are doing what we've asked, it makes no sense to ask  them to do more.           Each maintenance medication was reviewed in detail including emphasizing most importantly the difference between maintenance and prns and under what circumstances the prns are to be triggered using an action plan format where appropriate.  Total time for H and P, chart review, counseling,  and generating customized AVS unique to this office visit / same day charting = 24 m

## 2023-02-04 ENCOUNTER — Other Ambulatory Visit: Payer: Self-pay | Admitting: Gastroenterology

## 2023-02-04 ENCOUNTER — Encounter: Payer: Self-pay | Admitting: Gastroenterology

## 2023-02-04 ENCOUNTER — Ambulatory Visit (INDEPENDENT_AMBULATORY_CARE_PROVIDER_SITE_OTHER): Payer: Medicaid Other | Admitting: Bariatrics

## 2023-02-04 VITALS — BP 127/75 | HR 60 | Temp 98.2°F | Ht 68.0 in | Wt 273.0 lb

## 2023-02-04 DIAGNOSIS — E1169 Type 2 diabetes mellitus with other specified complication: Secondary | ICD-10-CM | POA: Diagnosis not present

## 2023-02-04 DIAGNOSIS — Z7985 Long-term (current) use of injectable non-insulin antidiabetic drugs: Secondary | ICD-10-CM

## 2023-02-04 DIAGNOSIS — F5089 Other specified eating disorder: Secondary | ICD-10-CM | POA: Diagnosis not present

## 2023-02-04 DIAGNOSIS — Z6841 Body Mass Index (BMI) 40.0 and over, adult: Secondary | ICD-10-CM

## 2023-02-04 DIAGNOSIS — E669 Obesity, unspecified: Secondary | ICD-10-CM | POA: Insufficient documentation

## 2023-02-04 DIAGNOSIS — I10 Essential (primary) hypertension: Secondary | ICD-10-CM | POA: Diagnosis not present

## 2023-02-04 DIAGNOSIS — F3289 Other specified depressive episodes: Secondary | ICD-10-CM

## 2023-02-04 MED ORDER — TOPIRAMATE 50 MG PO TABS: 50.0000 mg | ORAL_TABLET | Freq: Two times a day (BID) | ORAL | 0 refills | Status: DC

## 2023-02-04 MED ORDER — TRULICITY 4.5 MG/0.5ML ~~LOC~~ SOAJ: 4.5000 mg | SUBCUTANEOUS | 0 refills | Status: DC

## 2023-02-09 ENCOUNTER — Encounter: Payer: Self-pay | Admitting: Bariatrics

## 2023-02-09 NOTE — Progress Notes (Signed)
Chief Complaint:   OBESITY Robin [name] is here to discuss her progress with her obesity treatment plan along with follow-up of her obesity related diagnoses. Robin [name] is on the Category 3 Plan and states she is following her eating plan approximately 30% of the time. Robin [name] states she is doing chair exercises for 30-45 minutes 3-4 times per week.  Today's visit was #: 22 Starting weight: 310 lbs Starting date: 06/03/2021 Today's weight: 273 lbs Today's date: 02/04/2023 Total lbs lost to date: 37 Total lbs lost since last in-office visit: 0  Interim History: Robin [name] is up 3 lbs since her last visit. She has been eating and snacking more, ans she is eating more sweets. She has increased stress at home.   Subjective:   1. Type 2 diabetes mellitus with obesity (HCC) Robin [name] is taking Trulicity.   2. Essential hypertension Robin [name] is taking her medications as directed.   3. Other disorder of eating Robin [name] is taking Topamax and Wellbutrin.   Assessment/Plan:   1. Type 2 diabetes mellitus with obesity (HCC) Robin [name] will continue her medications, and she will continue Trulicity at 4.5 mg and we will refill for 1 month. She will work on eating less sweets and more healthy fruits.   - Dulaglutide (TRULICITY) 4.5 MG/0.5ML SOPN; Inject 4.5 mg as directed once a week.  Dispense: 2 mL; Refill: 0  2. Essential hypertension Robin [name] will continue her medications as directed.   3. Other disorder of eating Robin [name] will continue her medications, and we will refill Topamax at 50 mg BID for 1 month.   - topiramate (TOPAMAX) 50 MG tablet; Take 1 tablet (50 mg total) by mouth 2 (two) times daily.  Dispense: 60 tablet; Refill: 0  4. Morbid obesity with BMI of 60.0-69.9, adult (HCC)  5. BMI 40.0-44.9, adult [facility]) Robin [name] is currently in the action stage of change. As such, her goal is to continue with weight loss efforts. She has agreed to the Category 3 Plan.   Meal planning was discussed.   She will adhere to the plan 85-95%.  She will work on increasing her water intake to 64 ounces daily.  Increase healthy fruits, and eliminate sweets.  Exercise goals: As is.   Behavioral modification strategies: increasing lean protein intake, decreasing simple carbohydrates, increasing vegetables, increasing water intake, decreasing eating out, no skipping meals, meal planning and cooking strategies, keeping healthy foods in the home, and planning for success.  Robin [name] has agreed to follow-up with our clinic in 4 weeks. She was informed of the importance of frequent follow-up visits to maximize her success with intensive lifestyle modifications for her multiple health conditions.   Objective:   Blood pressure 127/75, pulse 60, temperature 98.2 F (36.8 C), height 5\' 8"  (1.727 m), weight 273 lb (123.8 kg), SpO2 99 %. Body mass index is 41.51 kg/m.  General: Cooperative, alert, well developed, in no acute distress. HEENT: Conjunctivae and lids unremarkable. Cardiovascular: Regular rhythm.  Lungs: Normal work of breathing. Neurologic: No focal deficits.   Lab Results  Component Value Date   CREATININE 0.96 10/06/2022   BUN 16 10/06/2022   NA CANCELED 10/06/2022   K CANCELED 10/06/2022   CL CANCELED 10/06/2022   CO2 22 10/06/2022   Lab Results  Component Value Date   ALT 16 10/06/2022   AST 21 10/06/2022   ALKPHOS 74 10/06/2022   BILITOT 0.4 10/06/2022   Lab Results  Component Value Date   HGBA1C 5.3 10/06/2022   HGBA1C 6.3 07/10/2022  HGBA1C 5.3 03/10/2022   HGBA1C 5.7 (H) 11/17/2021   HGBA1C 5.7 11/10/2021   Lab Results  Component Value Date   INSULIN 28.2 (H) 11/17/2021   INSULIN 48.4 (H) 06/03/2021   Lab Results  Component Value Date   TSH 2.850 11/12/2022   Lab Results  Component Value Date   CHOL 140 10/06/2022   HDL 58 10/06/2022   LDLCALC 68 10/06/2022   TRIG 71 10/06/2022   CHOLHDL 2.2 07/10/2022   Lab Results  Component Value Date   VD25OH 37.7  10/06/2022   VD25OH 61.7 06/03/2021   Lab Results  Component Value Date   WBC 7.7 07/10/2022   HGB 12.4 07/10/2022   HCT 38.7 07/10/2022   MCV 91 07/10/2022   PLT 365 07/10/2022   Lab Results  Component Value Date   IRON 31 03/04/2015   TIBC 340 03/04/2015   FERRITIN 34 03/04/2015   Attestation Statements:   Reviewed by clinician on day of visit: allergies, medications, problem list, medical history, surgical history, family history, social history, and previous encounter notes.   Trude Mcburney, am acting as Energy manager for [employer], DO.  I have reviewed the above documentation for accuracy and completeness, and I agree with the above. Corinna Capra, DO

## 2023-02-15 ENCOUNTER — Ambulatory Visit
Admission: RE | Admit: 2023-02-15 | Discharge: 2023-02-15 | Disposition: A | Discharge status: Home / Self Care | Payer: Medicaid Other | Source: Ambulatory Visit | Attending: Internal Medicine | Admitting: Internal Medicine

## 2023-02-15 DIAGNOSIS — Z1231 Encounter for screening mammogram for malignant neoplasm of breast: Secondary | ICD-10-CM

## 2023-02-16 ENCOUNTER — Encounter (HOSPITAL_COMMUNITY): Payer: Self-pay

## 2023-02-16 ENCOUNTER — Ambulatory Visit (INDEPENDENT_AMBULATORY_CARE_PROVIDER_SITE_OTHER): Payer: Medicaid Other | Admitting: Clinical

## 2023-02-16 ENCOUNTER — Ambulatory Visit (AMBULATORY_SURGERY_CENTER): Payer: Medicaid Other | Admitting: *Deleted

## 2023-02-16 VITALS — Ht 68.0 in | Wt 270.0 lb

## 2023-02-16 DIAGNOSIS — Z86010 Personal history of colonic polyps: Secondary | ICD-10-CM

## 2023-02-16 DIAGNOSIS — F341 Dysthymic disorder: Secondary | ICD-10-CM | POA: Diagnosis not present

## 2023-02-16 MED ORDER — PEG 3350-KCL-NA BICARB-NACL 420 G PO SOLR
4000.0000 mL | Freq: Once | ORAL | 0 refills | Status: AC
Start: 2023-02-16 — End: 2023-02-16

## 2023-02-16 NOTE — Progress Notes (Signed)
Pt's name and DOB verified at the beginning of the pre-visit.  Pt states she has difficulty with ambulating, uses a  motorized chair. States she does not need  assistance to stand. She states she can lift self to a standing position from the chair and can stand for a few minutes and has no issues with sitting, laying down or rolling side to side Gave both [facility] main # and MD on call # prior to instructions.  No egg or soy allergy known to patient  No issues known to pt with past sedation with any surgeries or procedures Pt has no issues moving head pt states has a stiff neck, some issues with swallowing No FH of Malignant Hyperthermia Pt is not on diet pills Pt is not on home 02  Pt is not on blood thinners  Pt denies issues with constipation  Pt is not on dialysis Pt denise any abnormal heart rhythms  Pt denies any upcoming cardiac testing Pt encouraged to use to use Singlecare or Goodrx to reduce cost  Patient's chart reviewed by Cathlyn Parsons CNRA prior to pre-visit and patient appropriate for the [facility].  Pre-visit completed and red dot placed by patient's name on their procedure day (on provider's schedule).  . Visit by phone Pt states weight is 270 lb Instructed pt why it is important to and  to call if they have any changes in health or new medications. Directed them to the # given and on instructions.   Pt states they will.  Instructions reviewed with pt and pt states understanding. Instructed to review again prior to procedure. Pt states they will.  Instructions sent by mail with coupon and by my chart Pt stated she would like to have her throat checked as well since having swallowing issues.. Explained that current procedure needed to be canceled and OV made. Pt agreed. OV with Amy Esterwood on 5/21 @9 :30 am made with instructions to come 15 minutes prior to time. Pt states she will.

## 2023-02-16 NOTE — Progress Notes (Signed)
   THERAPIST PROGRESS NOTE  Session Time: 45 minutes  Participation Level: Active  Behavioral Response: CasualAlertDepressed  Type of Therapy: Individual Therapy  Treatment Goals addressed: client will score less than a 10 on the PHQ9 questionnaire   ProgressTowards Goals: Not Progressing  Interventions: CBT and Supportive  Summary:  Robin [name] is a 67 y.o. female who presents for the scheduled appointment oriented times five, appropriately dressed and friendly. Client denied hallucinations, delusions, suicidal and homicidal ideations. Client reported on today she is not doing well. Client reported she has been feeling depressed and in distress. Client reported the past few weeks have not been good. Client reported her husband she has been miserable at home. Client reported her daughters want her to move out for awhile to get a break but she cannot. Client reported she has too many doctor appointments to leave the city and/or state to stay with one of them. Client reported she does not know what she is going to do. Client reported she has days when she cries and can't stop. Client reported her appetite has decreased. Client reported she believes her husband tampers with the food so what she eats is limited. Client reported she does not know what to do as she feels defeated and his negativity controls her daily functioning. Client reported he randomly makes remarks about "wanting the marriage to work" bur treats her so poorly. Client reported it causes her confusion and she does not respond. Evidence of progress towards goal:  client reported use of 0 coping skills within the past week. Flowsheet Row Counselor from 02/16/2023 in [facility]  PHQ-9 Total Score 18        Suicidal/Homicidal: Nowithout intent/plan  Therapist Response:  Therapist began the appointment asking the client how she has been doing since last seen. Therapist used CBT to engage using  active listening and positive emotional support.' Therapist used CBT to engage and ask the client to discuss her thoughts and feelings pertaining to her home life. Therapist used CBT to validate and normalize her emotional response. Therapist used CBT to engage with the client to help brainstorm how she can reframe her negative cognitions. Therapist used CBT ask the client to identify her progress with frequency of use with coping skills with continued practice in her daily activity.       Plan: Return again in 3 weeks.  Diagnosis: persistent depressive disorder  Collaboration of Care: Patient refused AEB none requested by the client.  Patient/Guardian was advised Release of Information must be obtained prior to any record release in order to collaborate their care with an outside provider. Patient/Guardian was advised if they have not already done so to contact the registration department to sign all necessary forms in order for [name] to release information regarding their care.   Consent: Patient/Guardian gives verbal consent for treatment and assignment of benefits for services provided during this visit. Patient/Guardian expressed understanding and agreed to proceed.   Robin Rhymes Shaira Sova, LCSW 02/16/2023

## 2023-02-23 ENCOUNTER — Encounter: Payer: Self-pay | Admitting: Physician Assistant

## 2023-02-23 ENCOUNTER — Ambulatory Visit (INDEPENDENT_AMBULATORY_CARE_PROVIDER_SITE_OTHER): Payer: Medicaid Other | Admitting: Physician Assistant

## 2023-02-23 VITALS — BP 110/64 | HR 68 | Ht 66.0 in | Wt 271.2 lb

## 2023-02-23 DIAGNOSIS — K219 Gastro-esophageal reflux disease without esophagitis: Secondary | ICD-10-CM | POA: Diagnosis not present

## 2023-02-23 DIAGNOSIS — D126 Benign neoplasm of colon, unspecified: Secondary | ICD-10-CM

## 2023-02-23 DIAGNOSIS — R1310 Dysphagia, unspecified: Secondary | ICD-10-CM | POA: Diagnosis not present

## 2023-02-23 MED ORDER — FAMOTIDINE 20 MG PO TABS: 20.0000 mg | ORAL_TABLET | Freq: Two times a day (BID) | ORAL | 3 refills | Status: DC

## 2023-02-23 MED ORDER — NA SULFATE-K SULFATE-MG SULF 17.5-3.13-1.6 GM/177ML PO SOLN: 1.0000 | ORAL | 0 refills | Status: DC

## 2023-02-23 NOTE — Patient Instructions (Addendum)
_______________________________________________________  If your blood pressure at your visit was 140/90 or greater, please contact your primary care physician to follow up on this.  If you are age 65 or younger, your body mass index should be between 19-25. Your Body mass index is 43.78 kg/m. If this is out of the aformentioned range listed, please consider follow up with your Primary Care Provider.  ________________________________________________________  The [facility] providers would like to encourage you to use [facility] to communicate with providers for non-urgent requests or questions.  Due to long hold times on the telephone, sending your provider a message by [facility] may be a faster and more efficient way to get a response.  Please allow 48 business hours for a response.  Please remember that this is for non-urgent requests.  _______________________________________________________  Robin [name] have been scheduled for an endoscopy and colonoscopy. Please follow the written instructions given to you at your visit today. Please pick up your prep supplies at the pharmacy within the next 1-3 days. If you use inhalers (even only as needed), please bring them with you on the day of your procedure.  Please HOLD Aspirin 4 days prior to procedure.  Due to recent changes in healthcare laws, you may see the results of your imaging and laboratory studies on MyChart before your provider has had a chance to review them.  We understand that in some cases there may be results that are confusing or concerning to you. Not all laboratory results come back in the same time frame and the provider may be waiting for multiple results in order to interpret others.  Please give [name] 48 hours in order for your provider to thoroughly review all the results before contacting the office for clarification of your results.   CONTINUE: Nexium 40mg  one tablet twice daily  We have sent the following medications to your pharmacy for  you to pick up at your convenience:  START: Pepcid 20mg  one tablet twice daily with meals.  Thank you for entrusting me with your care and choosing [facility].  Amy Esterwood, PA-C

## 2023-02-23 NOTE — Progress Notes (Signed)
Subjective:    Patient ID: Robin [name], female    DOB: 1956-09-14, 67 y.o.   MRN: [id number]  HPI Robin [name] is a pleasant 67 year old African-American female, established with Dr. Tomasa [name] who had been seen in the office in August 2023.  She had previously been a patient of Dr. Jena [name] in [place]. She last had colonoscopy in March 2021 with removal of 4 sessile polyps, 4 to 9 mm in size.  Path showed these all to be sessile serrated polyps.  She was indicated for 3-year interval follow-up. She had EGD in 2019 which showed multiple antral erosions, normal esophagus.  She was complaining of dysphagia and empiric Maloney dilation was done to 43 [place].  Patient had been evaluated by previsit nurse here to schedule for colonoscopy with Dr. Tomasa [name] and mention that she was also having recurrent problems with dysphagia and therefore office visit was scheduled for today.  Patient does have history of chronic constipation, is on Linzess 145 mcg daily and says that this is helpful and effective.  She is on omeprazole 40 mg twice daily chronically but still has some issues with heartburn and indigestion which occur during the daytime and sometimes at nighttime.  She will have intermittent burning sensation in her throat, and also gets intermittent burning in her stomach. She is also been taking meloxicam on a regular basis but does not feel that she can function without this because she has severe arthritis symptoms. She definitely feels that prior dilation of her esophagus was helpful and helped for quite a while.  She has noticed some recurrence of symptoms over the past year primarily with pills and some foods like meat which seems to sit in her esophagus for a while.  She is not having any of the episodes requiring regurgitation.  She usually drinks fluids to push the food down.  This may occur once or twice per week.  Other medical issues include morbid obesity with BMI 43.7, , fibromyalgia,  severe arthritis, degenerative disc disease, adult onset diabetes mellitus, chronic thyroiditis, hepatic steatosis, sleep apnea and hypertension. She does not use any oxygen , no current CPAP. She is able to stand and transfer herself, but generally does not ambulate much at home due to pain in her hips and knees.  Review of Systems Pertinent positive and negative review of systems were noted in the above HPI section.  All other review of systems was otherwise negative.   Outpatient Encounter Medications as of 02/23/2023  Medication Sig   ACCU-CHEK AVIVA PLUS test strip USE TO check blood glucose daily AS DIRECTED   amLODipine (NORVASC) 10 MG tablet Take 1 tablet by mouth daily.   aspirin EC 81 MG tablet Take 81 mg by mouth at bedtime.   atorvastatin (LIPITOR) 40 MG tablet TAKE ONE TABLET BY MOUTH EVERY EVENING   azelastine (ASTELIN) 0.1 % nasal spray 1-2 sprays per nostril 2 times daily as needed   buPROPion (WELLBUTRIN XL) 300 MG 24 hr tablet Take 1 tablet (300 mg total) by mouth daily.   CALCIUM CARBONATE ANTACID PO Take 2 tablets by mouth See admin instructions. Take 2 tablets by mouth every morning, may also take 2 tablets at night as needed for acid reflux   celecoxib (CELEBREX) 100 MG capsule TAKE ONE CAPSULE BY MOUTH TWICE DAILY   CINNAMON PO Take 1 capsule by mouth daily.   Dulaglutide (TRULICITY) 4.5 MG/0.5ML SOPN Inject 4.5 mg as directed once a week.   DULoxetine (CYMBALTA) 30 MG capsule  Take 3 capsules (90 mg total) by mouth daily.   esomeprazole (NEXIUM) 40 MG capsule TAKE ONE CAPSULE BY MOUTH TWICE DAILY   famotidine (PEPCID) 20 MG tablet Take 1 tablet (20 mg total) by mouth 2 (two) times daily. Take with meals   fexofenadine (ALLEGRA) 180 MG tablet Take 1 tablet (180 mg total) by mouth daily.   fluticasone (FLONASE) 50 MCG/ACT nasal spray Place 1 spray into both nostrils daily.   gabapentin (NEURONTIN) 600 MG tablet TAKE ONE TABLET BY MOUTH THREE TIMES DAILY   hydrALAZINE  (APRESOLINE) 50 MG tablet TAKE ONE TABLET BY MOUTH EVERY MORNING and TAKE ONE TABLET BY MOUTH EVERY EVENING   hydrochlorothiazide (HYDRODIURIL) 25 MG tablet TAKE ONE TABLET BY MOUTH EVERY MORNING   levothyroxine (SYNTHROID) 88 MCG tablet Take 1 tablet by mouth daily.   linaclotide (LINZESS) 290 MCG CAPS capsule TAKE ONE CAPSULE BY MOUTH BEFORE BREAKFAST   losartan (COZAAR) 25 MG tablet Take 1 tablet (25 mg total) by mouth every evening.   meloxicam (MOBIC) 15 MG tablet    metFORMIN (GLUCOPHAGE) 500 MG tablet TAKE TWO TABLETS BY MOUTH EVERY MORNING and TAKE ONE TABLET BY MOUTH EVERY EVENING   metoprolol tartrate (LOPRESSOR) 50 MG tablet Take 1 tablet (50 mg total) by mouth 2 (two) times daily.   montelukast (SINGULAIR) 10 MG tablet TAKE ONE TABLET BY MOUTH EVERY EVENING   Multiple Vitamin (MULTIVITAMIN WITH MINERALS) TABS tablet Take 1 tablet by mouth daily.   Na Sulfate-K Sulfate-Mg Sulf (SUPREP BOWEL PREP KIT) 17.5-3.13-1.6 GM/177ML SOLN Take 1 kit by mouth as directed.   NARCAN 4 MG/0.1ML LIQD nasal spray kit    NEOMYCIN-POLYMYXIN-HYDROCORTISONE (CORTISPORIN) 1 % SOLN OTIC solution Place 3 drops into the left ear 4 (four) times daily.   Omega-3 Fatty Acids (FISH OIL) 1000 MG CAPS Take 1,000 mg by mouth at bedtime.   ondansetron (ZOFRAN) 4 MG tablet Take 1 tablet (4 mg total) by mouth every 8 (eight) hours as needed for nausea or vomiting.   Sodium Chloride-Sodium Bicarb (NETI POT SINUS WASH) 2300-700 MG KIT DO nasal rinse twice a week as needed   spironolactone (ALDACTONE) 25 MG tablet Take 1 tablet (25 mg total) by mouth every morning.   tiZANidine (ZANAFLEX) 2 MG tablet Take by mouth.   topiramate (TOPAMAX) 50 MG tablet Take 1 tablet (50 mg total) by mouth 2 (two) times daily.   VENTOLIN HFA 108 (90 Base) MCG/ACT inhaler INHALE 1-2 PUFFS BY MOUTH into THE lungs EVERY 6 HOURS AS NEEDED FOR WHEEZING AND/OR SHORTNESS OF BREATH   vitamin B-12 (CYANOCOBALAMIN) 1000 MCG tablet Take 1,000 mcg by  mouth daily.   benzonatate (TESSALON) 200 MG capsule Take 1 capsule (200 mg total) by mouth 2 (two) times daily as needed for cough. (Patient not taking: Reported on 02/16/2023)   [DISCONTINUED] amitriptyline (ELAVIL) 50 MG tablet TAKE 1 TABLET BY MOUTH AT BEDTIME.   No facility-administered encounter medications on file as of 02/23/2023.   Allergies  Allergen Reactions   Norvasc [Amlodipine Besylate] Other (See Comments)    Edema in lower extremities Other reaction(s): Other Edema in lower extremities   Celexa [Citalopram Hydrobromide] Other (See Comments)    "Made me feel out of it"   Diflucan [Fluconazole] Rash   Patient Active Problem List   Diagnosis Date Noted   Type 2 diabetes mellitus with obesity (HCC) 02/04/2023   Moderate major depression (HCC) 11/12/2022   BMI 40.0-44.9, adult (HCC) 11/03/2022   Opioid type dependence, continuous (HCC)  10/28/2022   B12 deficiency 10/06/2022   Depression 07/09/2022   Class 3 severe obesity with serious comorbidity and body mass index (BMI) of 45.0 to 49.9 in adult [facility]) 07/09/2022   Major depressive disorder, recurrent episode (HCC) 07/02/2022   Generalized anxiety disorder 07/02/2022   Body mass index 40.0-44.9, adult (HCC) 02/25/2022   Morbid obesity (HCC) 02/25/2022   Right knee pain 02/18/2022   Diabetes mellitus type 2 in obese 02/18/2022   Lumbar foraminal stenosis 01/23/2022   Lumbar radiculopathy 01/23/2022   Pain in both knees 01/23/2022   Vitamin D deficiency 11/18/2021   Polyp of descending colon 02/15/2020   Pituitary tumor 08/21/2019   Primary osteoarthritis of both hips 08/21/2019   Primary osteoarthritis of both knees 08/21/2019   Pain in left shoulder 08/21/2019   Morbid obesity with BMI of 60.0-69.9, adult (HCC) 07/17/2019   Pedal edema 07/17/2019   Multilevel degenerative disc disease 10/31/2018   Change in bowel function 10/28/2018   Gastritis and gastroduodenitis 04/26/2018   Atrophic vaginitis 04/01/2018    Cough, persistent 02/08/2018   Dysphagia 12/14/2017   Generalized OA 07/30/2017   Urinary incontinence 07/23/2017   Early satiety 06/16/2017   Hx of iron deficiency anemia 05/28/2017   Throat pain in adult 04/05/2017   Prediabetes 02/04/2017   Dyspnea on exertion 12/23/2016   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 12/23/2016   Hemoptysis 12/23/2016   Fibromyalgia 08/18/2016   Chronic lymphocytic thyroiditis 08/18/2016   Persistent depressive disorder 04/01/2016   OSA (obstructive sleep apnea) 04/01/2016   Essential hypertension 12/09/2015   Hypothyroidism 12/09/2015   Morbid obesity due to excess calories (HCC) 12/09/2015   Constipation 05/27/2015   Fatty liver 05/27/2015   Abdominal pain, chronic, epigastric 07/04/2012   History of gastroesophageal reflux (GERD) 06/09/2012   Hyperlipidemia 06/09/2012   Refusal of blood transfusions as patient is Jehovah's Witness 06/09/2012   Anxiety 06/09/2012   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   Abnormal small bowel biopsy 09/18/2011   Lactose intolerance 09/18/2011   GERD 01/21/2010   Social History   Socioeconomic History   Marital status: Married    Spouse name: Not on file   Number of children: 4   Years of education: 10   Highest education level: Not on file  Occupational History   Occupation: Disabled  Tobacco Use   Smoking status: Former    Packs/day: 0.50    Years: 10.00    Additional pack years: 0.00    Total pack years: 5.00    Types: Cigarettes    Start date: 07/14/1975    Quit date: 04/04/1993    Years since quitting: 29.9   Smokeless tobacco: Never   Tobacco comments:    quit 20 yrs ago  Vaping Use   Vaping Use: Never used  Substance and Sexual Activity   Alcohol use: No    Alcohol/week: 0.0 standard drinks of alcohol   Drug use: No   Sexual activity: Yes  Other Topics Concern   Not on file  Social History Narrative   Lives with husband in an apartment on the  first floor.  Has 4 children.     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  Trying to get disability.  Formerly worked as a Surveyor, mining.  Education: 11th grade.      [facility] (12/23/16):   Originally from [place]. She was raised in [place]. 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 No pets currently. No bird exposure. She does have mold in her current home in the bathroom, laundry room, & master bedroom.    Social Determinants of Health   Financial Resource Strain: Low Risk  (10/15/2020)   Overall Financial Resource Strain (CARDIA)    Difficulty of Paying Living Expenses: Not very hard  Food Insecurity: No Food Insecurity (10/15/2020)   Hunger Vital Sign    Worried About Running Out of Food in the Last Year: Never true    Ran Out of Food in the Last Year: Never true  Transportation Needs: Unmet Transportation Needs (10/15/2020)   PRAPARE - Administrator, Civil Service (Medical): Yes    Lack of Transportation (Non-Medical): No  Physical Activity: Inactive (10/15/2020)   Exercise Vital Sign    Days of Exercise per Week: 0 days    Minutes of Exercise per Session: 0 min  Stress: Stress Concern Present (10/15/2020)   [employer] of Occupational Health - Occupational Stress Questionnaire    Feeling of Stress : To some extent  Social Connections: Socially Integrated (08/14/2020)   Social Connection and Isolation Panel [NHANES]    Frequency of Communication with Friends and Family: More than three times a week    Frequency of Social Gatherings with Friends and Family: More than three times a week    Attends Religious Services: More than 4 times per year    Active Member of [employer] or Organizations: Yes    Attends Banker Meetings: More than 4 times per year    Marital Status: Married  Catering manager  Violence: Not At Risk (10/15/2020)   Humiliation, Afraid, Rape, and Kick questionnaire    Fear of Current or Ex-Partner: No    Emotionally Abused: No    Physically Abused: No    Sexually Abused: No    Ms. Cancelliere's family history includes Alcoholism in her maternal grandfather; Allergic rhinitis in her sister; Arthritis in her brother; Asthma in her mother; Breast cancer in her maternal grandmother and paternal grandmother; Colon polyps in her mother; Diabetes in her father and mother; Eczema in her daughter; Heart Problems in her mother; Hyperlipidemia in her brother, brother, daughter, father, mother, and sister; Hypertension in her brother, brother, daughter, father, mother, sister, and son; Kidney cancer in her father; Kidney disease in her maternal grandfather; Liver disease in her brother; Lupus in her maternal aunt; Obesity in her mother; Sleep apnea in her father and mother; Stomach cancer in an other family member.      Objective:    Vitals:   02/23/23 0929  BP: 110/64  Pulse: 68    Physical Exam Well-developed morbidly obese African-American female in no acute distress.  Patient is in a wheelchair today.  She is here alone  Weight, 271 BMI 43.7  HEENT; nontraumatic normocephalic, EOMI, PE R LA, sclera anicteric. Oropharynx; not examined today Neck; supple, no JVD Cardiovascular; regular rate and rhythm with S1-S2, no murmur rub or gallop Pulmonary; Clear bilaterally, decreased breath sounds bilateral bases Abdomen; soft, obese, nontender, nondistended, no palpable mass or hepatosplenomegaly, bowel sounds are active Rectal; not done today Skin; benign exam, no jaundice rash or appreciable lesions Extremities; no clubbing cyanosis or edema skin warm and dry Neuro/Psych; alert and oriented x4, grossly nonfocal mood and affect appropriate        Assessment & Plan:   #  68 67 year old African-American female with history of sessile serrated polyps, due for follow-up  colonoscopy. Last colonoscopy March 2021 per Dr. Jena [name] with removal of 4 sessile polyps which were all sessile serrated.  #2 history of chronic GERD-on twice daily Nexium. #3 intermittent dysphagia primarily to pills and meat x 1 year, no episodes requiring regurgitation. Prior EGD 2019 normal esophagus, she did undergo empiric dilation Maloney to 45 [place] with good improvement in symptoms. Rule out subtle stricture, esophageal ring, component of dysmotility  #4 morbid obesity BMI 43 #5.  Poor mobility due to severe arthritis and degenerative disc disease, in a wheelchair but able to stand pivot etc. #6 sleep apnea, no CPAP use, no oxygen #7.  Hepatic steatosis #8.  Hypertension #9.  Adult onset diabetes mellitus #10.  Fibromyalgia #11  Anxiety/depression  Plan; Patient will be scheduled for colonoscopy and EGD with probable dilation with Dr. Tomasa [name] in the [facility].  Both procedures were discussed in detail with the patient including indications risk benefits and she is agreeable to proceed. Continue Nexium 40 mg p.o. twice daily Will add a trial of Pepcid 20 mg p.o. twice daily with meals for complaints of breakthrough symptoms. Continue Linzess 145 mcg daily.  Shaye Lagace Oswald Hillock PA-C 02/23/2023   Cc: Marcine Matar, MD

## 2023-02-24 NOTE — Progress Notes (Signed)
Agree with the assessment and plan as outlined by Amy Esterwood, PA-C.  Brienne Liguori E. Delories Mauri, MD  

## 2023-02-25 ENCOUNTER — Ambulatory Visit: Payer: Self-pay

## 2023-02-25 NOTE — Telephone Encounter (Signed)
Pt called and informed to go to urgent care. Mobile unit is too far for her to travel.

## 2023-02-25 NOTE — Telephone Encounter (Signed)
    Chief Complaint: Watery diarrhea. Husband sick as well. Mild cramping. Asking to be seen tomorrow. Symptoms: Above Frequency: 3 days ago Pertinent Negatives: Patient denies fever Disposition: [] ED /[] Urgent Care (no appt availability in office) / [] Appointment(In office/virtual)/ []  [facility]/ [] Home Care/ [] Refused Recommended Disposition /[] [facility]/ [x]  Follow-up with PCP Additional Notes: Please advise pt.  Answer Assessment - Initial Assessment Questions 1. DIARRHEA SEVERITY: "How bad is the diarrhea?" "How many more stools have you had in the past 24 hours than normal?"    - NO DIARRHEA (SCALE 0)   - MILD (SCALE 1-3): Few loose or mushy BMs; increase of 1-3 stools over normal daily number of stools; mild increase in ostomy output.   -  MODERATE (SCALE 4-7): Increase of 4-6 stools daily over normal; moderate increase in ostomy output.   -  SEVERE (SCALE 8-10; OR "WORST POSSIBLE"): Increase of 7 or more stools daily over normal; moderate increase in ostomy output; incontinence.     4-5 2. ONSET: "When did the diarrhea begin?"      3 days ago 3. BM CONSISTENCY: "How loose or watery is the diarrhea?"      Watery 4. VOMITING: "Are you also vomiting?" If Yes, ask: "How many times in the past 24 hours?"      No 5. ABDOMEN PAIN: "Are you having any abdomen pain?" If Yes, ask: "What does it feel like?" (e.g., crampy, dull, intermittent, constant)      Crampy 6. ABDOMEN PAIN SEVERITY: If present, ask: "How bad is the pain?"  (e.g., Scale 1-10; mild, moderate, or severe)   - MILD (1-3): doesn't interfere with normal activities, abdomen soft and not tender to touch    - MODERATE (4-7): interferes with normal activities or awakens from sleep, abdomen tender to touch    - SEVERE (8-10): excruciating pain, doubled over, unable to do any normal activities       Mild 7. ORAL INTAKE: If vomiting, "Have you been able to drink liquids?" "How much liquids have you had in  the past 24 hours?"     Yes 8. HYDRATION: "Any signs of dehydration?" (e.g., dry mouth [not just dry lips], too weak to stand, dizziness, new weight loss) "When did you last urinate?"     No 9. EXPOSURE: "Have you traveled to a foreign country recently?" "Have you been exposed to anyone with diarrhea?" "Could you have eaten any food that was spoiled?"     No 10. ANTIBIOTIC USE: "Are you taking antibiotics now or have you taken antibiotics in the past 2 months?"       No 11. OTHER SYMPTOMS: "Do you have any other symptoms?" (e.g., fever, blood in stool)       No 12. PREGNANCY: "Is there any chance you are pregnant?" "When was your last menstrual period?"       No  Protocols used: [facility]

## 2023-03-01 ENCOUNTER — Other Ambulatory Visit: Payer: Self-pay | Admitting: Internal Medicine

## 2023-03-01 DIAGNOSIS — J309 Allergic rhinitis, unspecified: Secondary | ICD-10-CM

## 2023-03-02 ENCOUNTER — Ambulatory Visit: Payer: Self-pay | Admitting: *Deleted

## 2023-03-02 NOTE — Telephone Encounter (Signed)
Message from Elon Jester sent at 03/02/2023  1:22 PM EDT  Summary: info about diagnosis   Patient is requesting a call back from a nurse to give her clinical information about E. Coli as she was diagnosed about 3 weeks ago.          Call History   Type Contact Phone/Fax User  03/02/2023 01:20 PM EDT Phone (Incoming) Robin [name], Robin [name] (Self) [phone] Judie Petit) Elon Jester

## 2023-03-02 NOTE — Telephone Encounter (Signed)
Requested Prescriptions  Pending Prescriptions Disp Refills   montelukast (SINGULAIR) 10 MG tablet [Pharmacy Med Name: montelukast 10 mg tablet] 90 tablet 0    Sig: TAKE ONE TABLET BY MOUTH EVERY EVENING     Pulmonology:  Leukotriene Inhibitors Passed - 03/01/2023  8:04 AM      Passed - Valid encounter within last 12 months    Recent Outpatient Visits           3 months ago Type 2 diabetes mellitus with morbid obesity (HCC)   [place] [facility] Jonah Blue B, MD   7 months ago Type 2 diabetes mellitus with morbid obesity [facility])   [place] [facility] Jonah Blue B, MD   11 months ago Type 2 diabetes mellitus with morbid obesity [facility])   [place] [facility] Marcine Matar, MD   1 year ago Mixed stress and urge urinary incontinence   [place] [facility] Jonah Blue B, MD   1 year ago Type 2 diabetes mellitus with morbid obesity [facility])   [place] [facility] Marcine Matar, MD       Future Appointments             In 1 week Marcine Matar, MD [facility]   In 3 months Allyson Sabal, Delton See, MD [facility]             atorvastatin (LIPITOR) 40 MG tablet [Pharmacy Med Name: atorvastatin 40 mg tablet] 90 tablet 0    Sig: TAKE ONE TABLET BY MOUTH EVERY EVENING     Cardiovascular:  Antilipid - Statins Failed - 03/01/2023  8:04 AM      Failed - Lipid Panel in normal range within the last 12 months    Cholesterol, Total  Date Value Ref Range Status  10/06/2022 140 100 - 199 mg/dL Final   LDL Chol Calc (NIH)  Date Value Ref Range Status  10/06/2022 68 0 - 99 mg/dL Final   HDL  Date Value Ref Range Status  10/06/2022 58 >39 mg/dL Final   Triglycerides  Date Value Ref Range Status  10/06/2022 71 0 - 149 mg/dL Final         Passed - Patient is not pregnant       Passed - Valid encounter within last 12 months    Recent Outpatient Visits           3 months ago Type 2 diabetes mellitus with morbid obesity (HCC)   [place] [facility] Jonah Blue B, MD   7 months ago Type 2 diabetes mellitus with morbid obesity [facility])   [place] [facility] Jonah Blue B, MD   11 months ago Type 2 diabetes mellitus with morbid obesity [facility])   [place] [facility] Marcine Matar, MD   1 year ago Mixed stress and urge urinary incontinence   [place] [facility] Jonah Blue B, MD   1 year ago Type 2 diabetes mellitus with morbid obesity [facility])   [place] [facility] Marcine Matar, MD       Future Appointments             In 1 week Marcine Matar, MD [facility]  In 3 months Allyson Sabal, Delton See, MD [facility]

## 2023-03-02 NOTE — Telephone Encounter (Signed)
Reason for Disposition  [1] MODERATE diarrhea (e.g., 4-6 times / day more than normal) AND [2] present > 48 hours (2 days)  Answer Assessment - Initial Assessment Questions 1. REASON FOR CALL or QUESTION: "What is your reason for calling today?" or "How can I best help you?" or "What question do you have that I can help answer?"     My husband goes to the [place] and he was told he has E Coli.   He was put on an antibiotic.    I'm nauseas and having diarrhea.   I'm feeling terrible.   I feel really warm at times.   The diarrhea and nausea for about 4 weeks.   My husband and I both are having diarrhea.    [facility].    We have been struggling with this for 4 weeks.  Answer Assessment - Initial Assessment Questions 1. DIARRHEA SEVERITY: "How bad is the diarrhea?" "How many more stools have you had in the past 24 hours than normal?"    - NO DIARRHEA (SCALE 0)   - MILD (SCALE 1-3): Few loose or mushy BMs; increase of 1-3 stools over normal daily number of stools; mild increase in ostomy output.   -  MODERATE (SCALE 4-7): Increase of 4-6 stools daily over normal; moderate increase in ostomy output.   -  SEVERE (SCALE 8-10; OR "WORST POSSIBLE"): Increase of 7 or more stools daily over normal; moderate increase in ostomy output; incontinence.     I'm having diarrhea and nausea for about 4 weeks now.   My husband was diagnosed with E Coli from the [place].    We are both having the same symptoms.   Diarrhea. 2. ONSET: "When did the diarrhea begin?"      4 weeks diarrhea ago 3. BM CONSISTENCY: "How loose or watery is the diarrhea?"      It's watery 4. VOMITING: "Are you also vomiting?" If Yes, ask: "How many times in the past 24 hours?"      Having a lot of nausea but no vomiting. 5. ABDOMEN PAIN: "Are you having any abdomen pain?" If Yes, ask: "What does it feel like?" (e.g., crampy, dull, intermittent, constant)      I'm having abd cramping.   6. ABDOMEN PAIN SEVERITY: If present,  ask: "How bad is the pain?"  (e.g., Scale 1-10; mild, moderate, or severe)   - MILD (1-3): doesn't interfere with normal activities, abdomen soft and not tender to touch    - MODERATE (4-7): interferes with normal activities or awakens from sleep, abdomen tender to touch    - SEVERE (8-10): excruciating pain, doubled over, unable to do any normal activities       Mild cramping. 7. ORAL INTAKE: If vomiting, "Have you been able to drink liquids?" "How much liquids have you had in the past 24 hours?"     Trying to drink fluids.   8. HYDRATION: "Any signs of dehydration?" (e.g., dry mouth [not just dry lips], too weak to stand, dizziness, new weight loss) "When did you last urinate?"     I'm weak and don't feel good. 9. EXPOSURE: "Have you traveled to a foreign country recently?" "Have you been exposed to anyone with diarrhea?" "Could you have eaten any food that was spoiled?"     My husband was diagnosed with E Coli at the [place] 6 weeks ago. 10. ANTIBIOTIC USE: "Are you taking antibiotics now or have you taken antibiotics  in the past 2 months?"       No 11. OTHER SYMPTOMS: "Do you have any other symptoms?" (e.g., fever, blood in stool)       Nausea and not feeling good 12. PREGNANCY: "Is there any chance you are pregnant?" "When was your last menstrual period?"       N/A due to age  Protocols used: Information Only Call - No Triage-A-AH, Diarrhea-A-AH  Chief Complaint: Diarrhea and nausea for a month.  Husband diagnosed with E Coli.  She is having the same symptoms. Symptoms: Not feeling well,nausea and diarrhea Frequency: For the last month Pertinent Negatives: Patient denies fever, seeing blood in diarrhea, no vomiting but very nauseas Disposition: [] ED /[] Urgent Care (no appt availability in office) / [] Appointment(In office/virtual)/ []  [facility]/ [] Home Care/ [] Refused Recommended Disposition /[x] [place] [facility]/ []  Follow-up with PCP Additional Notes: No appts.  Available at [facility].   There is one with Dr. Delford Field today at 3:50 however she can't make it on that short a notice (2:55 PM now).    She is agreeable to going to the [facility] on Thursday because they will be in [facility] that day.    I gave her the location and hours with instructions to go to the ED if her symptoms become worse.
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
## 2023-03-04 ENCOUNTER — Encounter: Payer: Self-pay | Admitting: Physician Assistant

## 2023-03-04 ENCOUNTER — Encounter (HOSPITAL_COMMUNITY): Payer: Medicaid Other | Admitting: Psychiatry

## 2023-03-04 ENCOUNTER — Ambulatory Visit: Payer: Medicaid Other | Admitting: Physician Assistant

## 2023-03-04 VITALS — BP 148/84 | HR 61 | Ht 68.0 in | Wt 268.0 lb

## 2023-03-04 DIAGNOSIS — R110 Nausea: Secondary | ICD-10-CM

## 2023-03-04 DIAGNOSIS — R197 Diarrhea, unspecified: Secondary | ICD-10-CM

## 2023-03-04 MED ORDER — CIPROFLOXACIN HCL 500 MG PO TABS
500.0000 mg | ORAL_TABLET | Freq: Two times a day (BID) | ORAL | 0 refills | Status: AC
Start: 2023-03-04 — End: 2023-03-07

## 2023-03-04 MED ORDER — ONDANSETRON 8 MG PO TBDP
8.0000 mg | ORAL_TABLET | Freq: Three times a day (TID) | ORAL | 0 refills | Status: AC | PRN
Start: 2023-03-04 — End: ?

## 2023-03-04 NOTE — Progress Notes (Signed)
Established Patient Office Visit  Subjective   Patient ID: Robin [name], female    DOB: July 04, 1956  Age: 67 y.o. MRN: [id number]  Chief Complaint  Patient presents with   Nausea   Fatigue        Diarrhea    Symptoms present for 4-6 weeks     States that she has been experiencing 4-6 episodes of diarrhea on a daily basis for the past 4 weeks.  States that she will experience stomach cramping prior to and resolved with diarrhea episodes.  Does endorse nighttime awakenings.  States that she also has been experiencing episodes of nausea.  States that she is eating and drinking okay.  Otherwise has not tried anything for relief.  States that her husband had started having similar symptoms 2 weeks prior and had stool studies completed at the [place] and was found to have E. coli.  States that he was started on an antibiotic with some relief.  Denies any recent travel, denies any recent antibiotic use.    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   Anemia    Anxiety disorder    Arthritis    Asthma    Back pain    Chronic neck pain    Colon polyps    Constipation    Depression    Diabetes mellitus without complication (HCC)    Dyspnea    Edema of lower extremity    Fibromyalgia    GERD (gastroesophageal reflux disease)    Heartburn    Hiatal hernia    small   Hyperlipidemia    Hypertension    Hypothyroidism    Joint pain    Morbid obesity with BMI of 50.0-59.9, adult (HCC)    Osteoarthritis    Other fatigue    Schatzki's ring    non critical   Severe episode of recurrent major depressive disorder, with psychotic features (HCC) 05/26/2018   Sinusitis    Sleep apnea    CPAP, Sleep study at [facility]   SOB (shortness of breath) on exertion    Status post dilation of esophageal narrowing    Swallowing difficulty    Social History   Socioeconomic History   Marital status:  Married    Spouse name: Not on file   Number of children: 4   Years of education: 10   Highest education level: Not on file  Occupational History   Occupation: Disabled  Tobacco Use   Smoking status: Former    Packs/day: 0.50    Years: 10.00    Additional pack years: 0.00    Total pack years: 5.00    Types: Cigarettes    Start date: 07/14/1975    Quit date: 04/04/1993    Years since quitting: 29.9   Smokeless tobacco: Never   Tobacco comments:    quit 20 yrs ago  Vaping Use   Vaping Use: Never used  Substance and Sexual Activity   Alcohol use: No    Alcohol/week: 0.0 standard drinks of alcohol   Drug use: No   Sexual activity: Yes  Other Topics Concern   Not on file  Social History Narrative   Lives with husband in an apartment on the first floor.  Has 4 children.     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  Trying to get disability.  Formerly worked as a school bus  driver.  Education: 11th grade.      [facility] (12/23/16):   Originally from [place]. She was raised in [place]. 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 No pets currently. No bird exposure. She does have mold in her current home in the bathroom, laundry room, & master bedroom.    Social Determinants of Health   Financial Resource Strain: Low Risk  (10/15/2020)   Overall Financial Resource Strain (CARDIA)    Difficulty of Paying Living Expenses: Not very hard  Food Insecurity: No Food Insecurity (10/15/2020)   Hunger Vital Sign    Worried About Running Out of Food in the Last Year: Never true    Ran Out of Food in the Last Year: Never true  Transportation Needs: Unmet Transportation Needs (10/15/2020)   PRAPARE - Administrator, Civil Service (Medical): Yes    Lack of Transportation (Non-Medical): No  Physical Activity: Inactive (10/15/2020)    Exercise Vital Sign    Days of Exercise per Week: 0 days    Minutes of Exercise per Session: 0 min  Stress: Stress Concern Present (10/15/2020)   [employer] of Occupational Health - Occupational Stress Questionnaire    Feeling of Stress : To some extent  Social Connections: Socially Integrated (08/14/2020)   Social Connection and Isolation Panel [NHANES]    Frequency of Communication with Friends and Family: More than three times a week    Frequency of Social Gatherings with Friends and Family: More than three times a week    Attends Religious Services: More than 4 times per year    Active Member of [employer] or Organizations: Yes    Attends Engineer, structural: More than 4 times per year    Marital Status: Married  Catering manager Violence: Not At Risk (10/15/2020)   Humiliation, Afraid, Rape, and Kick questionnaire    Fear of Current or Ex-Partner: No    Emotionally Abused: No    Physically Abused: No    Sexually Abused: No   Family History  Problem Relation Age of Onset   Diabetes Mother    Hypertension Mother    Heart Problems Mother    Hyperlipidemia Mother    Asthma Mother    Sleep apnea Mother    Obesity Mother    Colon polyps Mother    Diabetes Father    Kidney cancer Father    Hypertension Father    Hyperlipidemia Father    Sleep apnea Father    Hypertension Sister    Allergic rhinitis Sister    Hyperlipidemia Sister    Liver disease Brother    Arthritis Brother    Hypertension Brother    Hyperlipidemia Brother    Hypertension Brother    Hyperlipidemia Brother    Lupus Maternal Aunt    Breast cancer Maternal Grandmother    Kidney disease Maternal Grandfather    Alcoholism Maternal Grandfather    Breast cancer Paternal Grandmother    Hypertension Daughter    Hyperlipidemia Daughter    Eczema Daughter    Hypertension Son    Stomach cancer Other        aunt   Colon cancer Neg Hx    Immunodeficiency Neg Hx    Urticaria Neg Hx    Prostate  cancer Neg Hx    Rectal cancer Neg Hx    Esophageal  cancer Neg Hx    Allergies  Allergen Reactions   Norvasc [Amlodipine Besylate] Other (See Comments)    Edema in lower extremities Other reaction(s): Other Edema in lower extremities   Celexa [Citalopram Hydrobromide] Other (See Comments)    "Made me feel out of it"   Diflucan [Fluconazole] Rash    Review of Systems  Constitutional:  Negative for chills and fever.  HENT: Negative.    Eyes: Negative.   Respiratory:  Negative for shortness of breath.   Cardiovascular:  Negative for chest pain.  Gastrointestinal:  Positive for abdominal pain, diarrhea and nausea. Negative for blood in stool, melena and vomiting.  Genitourinary:  Negative for dysuria and frequency.  Musculoskeletal: Negative.   Skin: Negative.   Neurological: Negative.   Endo/Heme/Allergies: Negative.   Psychiatric/Behavioral: Negative.        Objective:     BP (!) 148/84 (BP Location: Left Arm, Patient Position: Sitting, Cuff Size: Large)   Pulse 61   Ht 5\' 8"  (1.727 m)   Wt 268 lb (121.6 kg)   SpO2 99%   BMI 40.75 kg/m    Physical Exam Vitals and nursing note reviewed.  Constitutional:      General: She is not in acute distress.    Appearance: Normal appearance.     Comments: Limited exam due to patients ability to ambulate  HENT:     Head: Normocephalic and atraumatic.     Right Ear: External ear normal.     Left Ear: External ear normal.     Nose: Nose normal.     Mouth/Throat:     Mouth: Mucous membranes are moist.     Pharynx: Oropharynx is clear.  Eyes:     Extraocular Movements: Extraocular movements intact.     Conjunctiva/sclera: Conjunctivae normal.     Pupils: Pupils are equal, round, and reactive to light.  Cardiovascular:     Rate and Rhythm: Normal rate and regular rhythm.     Pulses: Normal pulses.     Heart sounds: Normal heart sounds.  Pulmonary:     Effort: Pulmonary effort is normal.     Breath sounds: Normal breath  sounds.  Musculoskeletal:     Cervical back: Normal range of motion and neck supple.  Skin:    General: Skin is warm and dry.  Neurological:     General: No focal deficit present.     Mental Status: She is alert and oriented to person, place, and time.  Psychiatric:        Mood and Affect: Mood normal.        Behavior: Behavior normal.        Thought Content: Thought content normal.        Judgment: Judgment normal.        Assessment & Plan:   Problem List Items Addressed This Visit   None Visit Diagnoses     Diarrhea of presumed infectious origin    -  Primary   Relevant Medications   ciprofloxacin (CIPRO) 500 MG tablet   Nausea without vomiting       Relevant Medications   ondansetron (ZOFRAN-ODT) 8 MG disintegrating tablet      1. Diarrhea of presumed infectious origin Trial Cipro.  Patient education given on supportive care.  Red flags given for prompt reevaluation. - ciprofloxacin (CIPRO) 500 MG tablet; Take 1 tablet (500 mg total) by mouth 2 (two) times daily for 3 days.  Dispense: 6 tablet; Refill: 0  2. Nausea without vomiting  Trial Zofran.  Patient education given on supportive care - ondansetron (ZOFRAN-ODT) 8 MG disintegrating tablet; Take 1 tablet (8 mg total) by mouth every 8 (eight) hours as needed for nausea or vomiting.  Dispense: 20 tablet; Refill: 0  Patient unable to ambulate without use of power chair.  Unable to fit power chair and mobile unit.  Interview and physical exam was done while patient was sitting in her vehicle.  Patient understands and agreed to this type of visit.   I have reviewed the patient's medical history (PMH, PSH, Social History, Family History, Medications, and allergies) , and have been updated if relevant. I spent 20 minutes reviewing chart and  face to face time with patient.    Return if symptoms worsen or fail to improve.    Kasandra Knudsen Mayers, PA-C

## 2023-03-04 NOTE — Patient Instructions (Addendum)
You are going to take Cipro twice a day for 3 days.  I encourage you to use a probiotic, eat plenty of yogurt, make sure that you are staying very well-hydrated.  To help with your nausea, you can use Zofran every 8 hours as needed.  I hope that you feel better soon, please let us know if there is anything else we can do for you  Roney Jaffe, PA-C Physician Assistant [facility] [email or web address]   Diarrhea, Adult Diarrhea is frequent loose and sometimes watery bowel movements. Diarrhea can make you feel weak and cause you to become dehydrated. Dehydration is a condition in which there is not enough water or other fluids in the body. Dehydration can make you tired and thirsty, cause you to have a dry mouth, and decrease how often you urinate. Diarrhea typically lasts 2-3 days. However, it can last longer if it is a sign of something more serious. It is important to treat your diarrhea as told by your health care provider. Follow these instructions at home: Eating and drinking     Follow these recommendations as told by your health care provider: Take an oral rehydration solution (ORS). This is an over-the-counter medicine that helps return your body to its normal balance of nutrients and water. It is found at pharmacies and retail stores. Drink enough fluid to keep your urine pale yellow. Drink fluids such as water, diluted fruit juice, and low-calorie sports drinks. You can drink milk also, if desired. Sucking on ice chips is another way to get fluids. Avoid drinking fluids that contain a lot of sugar or caffeine, such as soda, energy drinks, and regular sports drinks. Avoid alcohol. Eat bland, easy-to-digest foods in small amounts as you are able. These foods include bananas, applesauce, rice, lean meats, toast, and crackers. Avoid spicy or fatty foods.  Medicines Take over-the-counter and prescription medicines only as told  by your health care provider. If you were prescribed antibiotics, take them as told by your health care provider. Do not stop using the antibiotic even if you start to feel better. General instructions  Wash your hands often using soap and water for at least 20 seconds. If soap and water are not available, use hand sanitizer. Others in the household should wash their hands as well. Hands should be washed: After using the toilet or changing a diaper. Before preparing, cooking, or serving food. While caring for a sick person or while visiting someone in a hospital. Rest at home while you recover. Take a warm bath to relieve any burning or pain from frequent diarrhea episodes. Watch your condition for any changes. Contact a health care provider if: You have a fever. Your diarrhea gets worse. You have new symptoms. You vomit every time you eat or drink. You feel light-headed, dizzy, or have a headache. You have muscle cramps. You have signs of dehydration, such as: Dark urine, very little urine, or no urine. Cracked lips. Dry mouth. Sunken eyes. Sleepiness. Weakness. You have bloody or black stools or stools that look like tar. You have severe pain, cramping, or bloating in your abdomen. Your skin feels cold and clammy. You feel confused. Get help right away if: You have chest pain or your heart is beating very quickly. You have trouble breathing or you are breathing very quickly. You feel extremely weak or you faint. These symptoms may be an emergency. Get help right away. Call 911. Do not wait to see if the symptoms will go  away. Do not drive yourself to the hospital. This information is not intended to replace advice given to you by your health care provider. Make sure you discuss any questions you have with your health care provider. Document Revised: 03/10/2022 Document Reviewed: 03/10/2022 Elsevier Patient Education  2024 Elsevier Inc.  Nausea, Adult Nausea is the feeling  of having an upset stomach or that you are about to vomit. Nausea on its own is not usually a serious concern, but it may be an early sign of a more serious medical problem. As nausea gets worse, it can lead to vomiting. If vomiting develops, or if you are not able to drink enough fluids, you are at risk of becoming dehydrated. Dehydration can make you tired and thirsty, cause you to have a dry mouth, and decrease how often you urinate. Older adults and people with other diseases or a weak disease-fighting system (immune system) are at higher risk for dehydration. The main goals of treating your nausea are: To relieve your nausea. To limit repeated nausea episodes. To prevent vomiting and dehydration. Follow these instructions at home: Watch your symptoms for any changes. Tell your health care provider about them. Eating and drinking     Take an oral rehydration solution (ORS). This is a drink that is sold at pharmacies and retail stores. Drink clear fluids slowly and in small amounts as you are able. Clear fluids include water, ice chips, low-calorie sports drinks, and fruit juice that has water added (diluted fruit juice). Eat bland, easy-to-digest foods in small amounts as you are able. These foods include bananas, applesauce, rice, lean meats, toast, and crackers. Avoid drinking fluids that contain a lot of sugar or caffeine, such as energy drinks, sports drinks, and soda. Avoid alcohol. Avoid spicy or fatty foods. General instructions Take over-the-counter and prescription medicines only as told by your health care provider. Rest at home while you recover. Drink enough fluid to keep your urine pale yellow. Breathe slowly and deeply when you feel nauseous. Avoid smelling things that have strong odors. Wash your hands often using soap and water for at least 20 seconds. If soap and water are not available, use hand sanitizer. Make sure that everyone in your household washes their hands well  and often. Keep all follow-up visits. This is important. Contact a health care provider if: Your nausea gets worse. Your nausea does not go away after two days. You vomit multiple times. You cannot drink fluids without vomiting. You have any of the following: New symptoms. A fever. A headache. Muscle cramps. A rash. Pain while urinating. You feel light-headed or dizzy. Get help right away if: You have pain in your chest, neck, arm, or jaw. You feel extremely weak or you faint. You have vomit that is bright red or looks like coffee grounds. You have bloody or black stools (feces) or stools that look like tar. You have a severe headache, a stiff neck, or both. You have severe pain, cramping, or bloating in your abdomen. You have difficulty breathing or are breathing very quickly. Your heart is beating very quickly. Your skin feels cold and clammy. You feel confused. You have signs of dehydration, such as: Dark urine, very little urine, or no urine. Cracked lips. Dry mouth. Sunken eyes. Sleepiness. Weakness. These symptoms may be an emergency. Get help right away. Call 911. Do not wait to see if the symptoms will go away. Do not drive yourself to the hospital. Summary Nausea is the feeling that you have  an upset stomach or that you are about to vomit. Nausea on its own is not usually a serious concern, but it may be an early sign of a more serious medical problem. If vomiting develops, or if you are not able to drink enough fluids, you are at risk of becoming dehydrated. Follow recommendations for eating and drinking and take over-the-counter and prescription medicines only as told by your health care provider. Contact a health care provider right away if your symptoms worsen or you have new symptoms. Keep all follow-up visits. This is important. This information is not intended to replace advice given to you by your health care provider. Make sure you discuss any questions you  have with your health care provider. Document Revised: 03/28/2021 Document Reviewed: 03/28/2021 Elsevier Patient Education  2024 [employer].

## 2023-03-08 ENCOUNTER — Other Ambulatory Visit: Payer: Self-pay | Admitting: Bariatrics

## 2023-03-08 DIAGNOSIS — F5089 Other specified eating disorder: Secondary | ICD-10-CM

## 2023-03-09 ENCOUNTER — Ambulatory Visit (HOSPITAL_COMMUNITY): Payer: Medicaid Other | Admitting: Clinical

## 2023-03-11 ENCOUNTER — Ambulatory Visit (INDEPENDENT_AMBULATORY_CARE_PROVIDER_SITE_OTHER): Payer: Medicaid Other | Admitting: Bariatrics

## 2023-03-11 ENCOUNTER — Encounter: Payer: Self-pay | Admitting: Bariatrics

## 2023-03-11 ENCOUNTER — Other Ambulatory Visit: Payer: Self-pay | Admitting: Internal Medicine

## 2023-03-11 ENCOUNTER — Telehealth: Payer: Self-pay | Admitting: Internal Medicine

## 2023-03-11 ENCOUNTER — Other Ambulatory Visit: Payer: Self-pay

## 2023-03-11 VITALS — BP 123/65 | HR 68 | Temp 98.5°F | Ht 68.0 in | Wt 265.0 lb

## 2023-03-11 DIAGNOSIS — F5089 Other specified eating disorder: Secondary | ICD-10-CM | POA: Diagnosis not present

## 2023-03-11 DIAGNOSIS — Z7984 Long term (current) use of oral hypoglycemic drugs: Secondary | ICD-10-CM

## 2023-03-11 DIAGNOSIS — E1169 Type 2 diabetes mellitus with other specified complication: Secondary | ICD-10-CM | POA: Diagnosis not present

## 2023-03-11 DIAGNOSIS — Z6841 Body Mass Index (BMI) 40.0 and over, adult: Secondary | ICD-10-CM

## 2023-03-11 MED ORDER — TOPIRAMATE 50 MG PO TABS
50.0000 mg | ORAL_TABLET | Freq: Two times a day (BID) | ORAL | 0 refills | Status: DC
Start: 2023-03-11 — End: 2023-04-06

## 2023-03-11 MED ORDER — TRULICITY 4.5 MG/0.5ML ~~LOC~~ SOAJ: 4.5000 mg | SUBCUTANEOUS | 0 refills | Status: DC

## 2023-03-11 NOTE — Telephone Encounter (Signed)
[facility] with [facility] states that need a legible copy of the pt medical necessity letter re sent to them along with a copy of the physician order. Per [employer] the physician order is cut off at the bottom.    fax [phone]

## 2023-03-11 NOTE — Telephone Encounter (Signed)
[place] [facility] Request for Prior Approval CMN/PA form received from [facility]. Forms given to PCP for completion.  

## 2023-03-11 NOTE — Progress Notes (Signed)
Patient attempted to be outreached by Jeffrey Voth, PharmD Candidate on 03/11/23 to discuss hypertension. Left voicemail for patient to return our call at their convenience at [phone].  Bohden Dung, Student-PharmD  

## 2023-03-15 ENCOUNTER — Encounter: Payer: Self-pay | Admitting: Internal Medicine

## 2023-03-15 ENCOUNTER — Ambulatory Visit: Payer: Medicaid Other | Attending: Internal Medicine | Admitting: Internal Medicine

## 2023-03-15 DIAGNOSIS — E039 Hypothyroidism, unspecified: Secondary | ICD-10-CM

## 2023-03-15 DIAGNOSIS — I10 Essential (primary) hypertension: Secondary | ICD-10-CM | POA: Diagnosis not present

## 2023-03-15 DIAGNOSIS — E1159 Type 2 diabetes mellitus with other circulatory complications: Secondary | ICD-10-CM

## 2023-03-15 DIAGNOSIS — M158 Other polyosteoarthritis: Secondary | ICD-10-CM

## 2023-03-15 DIAGNOSIS — R2689 Other abnormalities of gait and mobility: Secondary | ICD-10-CM | POA: Diagnosis not present

## 2023-03-15 DIAGNOSIS — Z79899 Other long term (current) drug therapy: Secondary | ICD-10-CM | POA: Diagnosis not present

## 2023-03-15 DIAGNOSIS — D352 Benign neoplasm of pituitary gland: Secondary | ICD-10-CM | POA: Diagnosis not present

## 2023-03-15 DIAGNOSIS — R300 Dysuria: Secondary | ICD-10-CM

## 2023-03-15 DIAGNOSIS — N816 Rectocele: Secondary | ICD-10-CM | POA: Diagnosis not present

## 2023-03-15 DIAGNOSIS — E1169 Type 2 diabetes mellitus with other specified complication: Secondary | ICD-10-CM

## 2023-03-15 DIAGNOSIS — F321 Major depressive disorder, single episode, moderate: Secondary | ICD-10-CM

## 2023-03-15 DIAGNOSIS — I152 Hypertension secondary to endocrine disorders: Secondary | ICD-10-CM

## 2023-03-15 DIAGNOSIS — M159 Polyosteoarthritis, unspecified: Secondary | ICD-10-CM | POA: Insufficient documentation

## 2023-03-15 DIAGNOSIS — Z7985 Long-term (current) use of injectable non-insulin antidiabetic drugs: Secondary | ICD-10-CM

## 2023-03-15 DIAGNOSIS — Z6841 Body Mass Index (BMI) 40.0 and over, adult: Secondary | ICD-10-CM | POA: Insufficient documentation

## 2023-03-15 DIAGNOSIS — F331 Major depressive disorder, recurrent, moderate: Secondary | ICD-10-CM | POA: Diagnosis not present

## 2023-03-15 LAB — POCT GLYCOSYLATED HEMOGLOBIN (HGB A1C): HbA1c, POC (controlled diabetic range): 5.4 % (ref 0.0–7.0)

## 2023-03-15 LAB — POCT URINALYSIS DIP (CLINITEK)
Bilirubin, UA: NEGATIVE
Blood, UA: NEGATIVE
Glucose, UA: NEGATIVE mg/dL
Ketones, POC UA: NEGATIVE mg/dL
Leukocytes, UA: NEGATIVE
Nitrite, UA: NEGATIVE
POC PROTEIN,UA: NEGATIVE
Spec Grav, UA: 1.02 (ref 1.010–1.025)
Urobilinogen, UA: 0.2 E.U./dL
pH, UA: 7.5 (ref 5.0–8.0)

## 2023-03-15 LAB — GLUCOSE, POCT (MANUAL RESULT ENTRY): POC Glucose: 94 mg/dl (ref 70–99)

## 2023-03-15 MED ORDER — SPIRONOLACTONE 25 MG PO TABS: 25.0000 mg | ORAL_TABLET | Freq: Every morning | ORAL | 1 refills | Status: DC

## 2023-03-15 MED ORDER — METOPROLOL TARTRATE 25 MG PO TABS
25.0000 mg | ORAL_TABLET | Freq: Two times a day (BID) | ORAL | 1 refills | Status: DC
Start: 2023-03-15 — End: 2023-09-08

## 2023-03-15 MED ORDER — LOSARTAN POTASSIUM 25 MG PO TABS: 25.0000 mg | ORAL_TABLET | Freq: Every evening | ORAL | 2 refills | Status: DC

## 2023-03-15 MED ORDER — ALBUTEROL SULFATE HFA 108 (90 BASE) MCG/ACT IN AERS: INHALATION_SPRAY | RESPIRATORY_TRACT | 6 refills | Status: DC

## 2023-03-15 MED ORDER — ATORVASTATIN CALCIUM 40 MG PO TABS: 40.0000 mg | ORAL_TABLET | Freq: Every evening | ORAL | 1 refills | Status: DC

## 2023-03-15 MED ORDER — ESOMEPRAZOLE MAGNESIUM 40 MG PO CPDR: 40.0000 mg | DELAYED_RELEASE_CAPSULE | Freq: Two times a day (BID) | ORAL | 3 refills | Status: DC

## 2023-03-15 NOTE — Progress Notes (Signed)
Patient ID: Robin [name], female    DOB: 1956/06/23  MRN: [id number]  CC: Diabetes (DM f/u.  Med refills. /Pt concerned about extended amounts of sitting/Vaginal pain / pressure, burning sensation when urinating x1 week)   Subjective: Robin [name] is a 67 y.o. female who presents for chronic ds management Her concerns today include:  Patient with history of HTN, DM, OSA, fibromyalgia, depression, obesity,  GERD, hypothyroid, OA of multiple js (hands, knees, shoulders, hips), pituitary adenoma status post resection   C/o burning with urination and outside the vagina x 1 wk.  Also pressure when she urinates.  Feels like bladder or vagina has fallen x few mths.  Has to Push it back in intermittently.  No hematuria.   Obesity/DM Results for orders placed or performed in visit on 03/15/23  POCT glucose (manual entry)  Result Value Ref Range   POC Glucose 94 70 - 99 mg/dl  POCT glycosylated hemoglobin (Hb A1C)  Result Value Ref Range   Hemoglobin A1C     HbA1c POC (<> result, manual entry)     HbA1c, POC (prediabetic range)     HbA1c, POC (controlled diabetic range) 5.4 0.0 - 7.0 %   *Note: Due to a large number of results and/or encounters for the requested time period, some results have not been displayed. A complete set of results can be found in Results Review.  Still follows with [facility].  Eating smaller portions.  Still on Trulicity 4.5 mg once a week, metformin 1 g in the morning and 500 mg in the evening and Topamax (to help with wgh loss) concern about extended sitting.  Reports she sits more than she walks around due to OA in knees and hips -does chair exercise and walks a little but would like to walk more. Can walk less than 20 feet before have to sit down -no longer in pain management through [facility].  On Celebrex  HTN:  Metoprolol dec to 50 BID on last visit.  other medications which are spironolactone, Norvasc 10 mg daily, Cozaar 25 mg daily, hydrochlorothiazide 25 mg daily,  hydralazine 50 mg twice a day continued.   MDD/GAD:  plugged in with [facility].  Sees her psychiatrist [place]owk.  Meds do help.    Hypothyroidism: Compliant with taking levothyroxine.  History of pituitary macroadenoma: Had surgery back in 2013 through [facility].  Last seen by the neurosurgeon there in 2020.  MRI at that time showed adenoma of 13 mm in size that is stable.  Neurosurgeon recommends follow-up in 5 years.  Patient Active Problem List   Diagnosis Date Noted   Type 2 diabetes mellitus with obesity (HCC) 02/04/2023   Moderate major depression (HCC) 11/12/2022   BMI 40.0-44.9, adult (HCC) 11/03/2022   Opioid type dependence, continuous (HCC) 10/28/2022   B12 deficiency 10/06/2022   Depression 07/09/2022   Class 3 severe obesity with serious comorbidity and body mass index (BMI) of 45.0 to 49.9 in adult (HCC) 07/09/2022   Major depressive disorder, recurrent episode (HCC) 07/02/2022   Generalized anxiety disorder 07/02/2022   Body mass index 40.0-44.9, adult (HCC) 02/25/2022   Morbid obesity (HCC) 02/25/2022   Right knee pain 02/18/2022   Diabetes mellitus type 2 in obese 02/18/2022   Lumbar foraminal stenosis 01/23/2022   Lumbar radiculopathy 01/23/2022   Pain in both knees 01/23/2022   Vitamin D deficiency 11/18/2021   Polyp of descending colon 02/15/2020   Pituitary tumor 08/21/2019   Primary osteoarthritis of both hips  08/21/2019   Primary osteoarthritis of both knees 08/21/2019   Pain in left shoulder 08/21/2019   Morbid obesity with BMI of 60.0-69.9, adult (HCC) 07/17/2019   Pedal edema 07/17/2019   Multilevel degenerative disc disease 10/31/2018   Change in bowel function 10/28/2018   Gastritis and gastroduodenitis 04/26/2018   Atrophic vaginitis 04/01/2018   Cough, persistent 02/08/2018   Dysphagia 12/14/2017   Generalized OA 07/30/2017   Urinary incontinence 07/23/2017   Early satiety 06/16/2017   Hx of iron deficiency anemia 05/28/2017   Throat pain in  adult 04/05/2017   Prediabetes 02/04/2017   Dyspnea on exertion 12/23/2016   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 12/23/2016   Hemoptysis 12/23/2016   Fibromyalgia 08/18/2016   Chronic lymphocytic thyroiditis 08/18/2016   Persistent depressive disorder 04/01/2016   OSA (obstructive sleep apnea) 04/01/2016   Essential hypertension 12/09/2015   Hypothyroidism 12/09/2015   Morbid obesity due to excess calories (HCC) 12/09/2015   Constipation 05/27/2015   Fatty liver 05/27/2015   Abdominal pain, chronic, epigastric 07/04/2012   History of gastroesophageal reflux (GERD) 06/09/2012   Hyperlipidemia 06/09/2012   Refusal of blood transfusions as patient is Jehovah's Witness 06/09/2012   Anxiety 06/09/2012   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   Abnormal small bowel biopsy 09/18/2011   Lactose intolerance 09/18/2011   GERD 01/21/2010     Current Outpatient Medications on File Prior to Visit  Medication Sig Dispense Refill   ACCU-CHEK AVIVA PLUS test strip USE TO check blood glucose daily AS DIRECTED 100 strip 0   amLODipine (NORVASC) 10 MG tablet Take 1 tablet by mouth daily.     aspirin EC 81 MG tablet Take 81 mg by mouth at bedtime.     atorvastatin (LIPITOR) 40 MG tablet TAKE ONE TABLET BY MOUTH EVERY EVENING 90 tablet 0   azelastine (ASTELIN) 0.1 % nasal spray 1-2 sprays per nostril 2 times daily as needed 30 mL 12   buPROPion (WELLBUTRIN XL) 300 MG 24 hr tablet Take 1 tablet (300 mg total) by mouth daily. 30 tablet 2   CALCIUM CARBONATE ANTACID PO Take 2 tablets by mouth See admin instructions. Take 2 tablets by mouth every morning, may also take 2 tablets at night as needed for acid reflux     celecoxib (CELEBREX) 100 MG capsule TAKE ONE CAPSULE BY MOUTH TWICE DAILY 180 capsule 3   CINNAMON PO Take 1 capsule by mouth daily.     Dulaglutide (TRULICITY) 4.5 MG/0.5ML SOPN Inject 4.5 mg as directed once a week. 2 mL 0   DULoxetine  (CYMBALTA) 30 MG capsule Take 3 capsules (90 mg total) by mouth daily. 90 capsule 2   esomeprazole (NEXIUM) 40 MG capsule TAKE ONE CAPSULE BY MOUTH TWICE DAILY 60 capsule 0   famotidine (PEPCID) 20 MG tablet Take 1 tablet (20 mg total) by mouth 2 (two) times daily. Take with meals 60 tablet 3   fexofenadine (ALLEGRA) 180 MG tablet Take 1 tablet (180 mg total) by mouth daily. 30 tablet 0   fluticasone (FLONASE) 50 MCG/ACT nasal spray Place 1 spray into both nostrils daily. 16 g 1   gabapentin (NEURONTIN) 600 MG tablet TAKE ONE TABLET BY MOUTH THREE TIMES DAILY 270 tablet 1   hydrALAZINE (APRESOLINE) 50 MG tablet TAKE ONE TABLET BY MOUTH EVERY MORNING and TAKE ONE TABLET BY MOUTH EVERY EVENING 180 tablet 3   hydrochlorothiazide (HYDRODIURIL) 25 MG tablet TAKE ONE TABLET BY MOUTH EVERY MORNING 90 tablet 3   levothyroxine (  SYNTHROID) 88 MCG tablet Take 1 tablet by mouth daily.     linaclotide (LINZESS) 290 MCG CAPS capsule TAKE ONE CAPSULE BY MOUTH BEFORE BREAKFAST 90 capsule 3   losartan (COZAAR) 25 MG tablet Take 1 tablet (25 mg total) by mouth every evening. 90 tablet 2   meloxicam (MOBIC) 15 MG tablet      metFORMIN (GLUCOPHAGE) 500 MG tablet TAKE TWO TABLETS BY MOUTH EVERY MORNING and TAKE ONE TABLET BY MOUTH EVERY EVENING 270 tablet 3   metoprolol tartrate (LOPRESSOR) 50 MG tablet Take 1 tablet (50 mg total) by mouth 2 (two) times daily. 180 tablet 3   montelukast (SINGULAIR) 10 MG tablet TAKE ONE TABLET BY MOUTH EVERY EVENING 90 tablet 0   Multiple Vitamin (MULTIVITAMIN WITH MINERALS) TABS tablet Take 1 tablet by mouth daily.     Na Sulfate-K Sulfate-Mg Sulf (SUPREP BOWEL PREP KIT) 17.5-3.13-1.6 GM/177ML SOLN Take 1 kit by mouth as directed. 324 mL 0   NARCAN 4 MG/0.1ML LIQD nasal spray kit      NEOMYCIN-POLYMYXIN-HYDROCORTISONE (CORTISPORIN) 1 % SOLN OTIC solution Place 3 drops into the left ear 4 (four) times daily. 10 mL 0   Omega-3 Fatty Acids (FISH OIL) 1000 MG CAPS Take 1,000 mg by mouth  at bedtime.     ondansetron (ZOFRAN-ODT) 8 MG disintegrating tablet Take 1 tablet (8 mg total) by mouth every 8 (eight) hours as needed for nausea or vomiting. 20 tablet 0   Sodium Chloride-Sodium Bicarb (NETI POT SINUS WASH) 2300-700 MG KIT DO nasal rinse twice a week as needed 1 kit 0   spironolactone (ALDACTONE) 25 MG tablet Take 1 tablet (25 mg total) by mouth every morning. 90 tablet 1   tiZANidine (ZANAFLEX) 2 MG tablet Take by mouth.     topiramate (TOPAMAX) 50 MG tablet Take 1 tablet (50 mg total) by mouth 2 (two) times daily. 60 tablet 0   VENTOLIN HFA 108 (90 Base) MCG/ACT inhaler INHALE 1-2 PUFFS BY MOUTH into THE lungs EVERY 6 HOURS AS NEEDED FOR WHEEZING AND/OR SHORTNESS OF BREATH 18 g 2   vitamin B-12 (CYANOCOBALAMIN) 1000 MCG tablet Take 1,000 mcg by mouth daily.     benzonatate (TESSALON) 200 MG capsule Take 1 capsule (200 mg total) by mouth 2 (two) times daily as needed for cough. (Patient not taking: Reported on 03/11/2023) 20 capsule 0   [DISCONTINUED] amitriptyline (ELAVIL) 50 MG tablet TAKE 1 TABLET BY MOUTH AT BEDTIME. 90 tablet 0   No current facility-administered medications on file prior to visit.    Allergies  Allergen Reactions   Norvasc [Amlodipine Besylate] Other (See Comments)    Edema in lower extremities Other reaction(s): Other Edema in lower extremities   Celexa [Citalopram Hydrobromide] Other (See Comments)    "Made me feel out of it"   Diflucan [Fluconazole] Rash    Social History   Socioeconomic History   Marital status: Married    Spouse name: Not on file   Number of children: 4   Years of education: 10   Highest education level: Not on file  Occupational History   Occupation: Disabled  Tobacco Use   Smoking status: Former    Packs/day: 0.50    Years: 10.00    Additional pack years: 0.00    Total pack years: 5.00    Types: Cigarettes    Start date: 07/14/1975    Quit date: 04/04/1993    Years since quitting: 29.9   Smokeless tobacco: Never    Tobacco comments:  quit 20 yrs ago  Vaping Use   Vaping Use: Never used  Substance and Sexual Activity   Alcohol use: No    Alcohol/week: 0.0 standard drinks of alcohol   Drug use: No   Sexual activity: Yes  Other Topics Concern   Not on file  Social History Narrative   Lives with husband in an apartment on the first floor.  Has 4 children.     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  Trying to get disability.  Formerly worked as a Surveyor, mining.  Education: 11th grade.       Pulmonary (12/23/16):   Originally from [facility]. She was raised in [place]. 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 No pets currently. No bird exposure. She does have mold in her current home in the bathroom, laundry room, & master bedroom.    Social Determinants of Health   Financial Resource Strain: Low Risk  (10/15/2020)   Overall Financial Resource Strain (CARDIA)    Difficulty of Paying Living Expenses: Not very hard  Food Insecurity: No Food Insecurity (10/15/2020)   Hunger Vital Sign    Worried About Running Out of Food in the Last Year: Never true    Ran Out of Food in the Last Year: Never true  Transportation Needs: Unmet Transportation Needs (10/15/2020)   PRAPARE - Administrator, Civil Service (Medical): Yes    Lack of Transportation (Non-Medical): No  Physical Activity: Inactive (10/15/2020)   Exercise Vital Sign    Days of Exercise per Week: 0 days    Minutes of Exercise per Session: 0 min  Stress: Stress Concern Present (10/15/2020)   [employer] of Occupational Health - Occupational Stress Questionnaire    Feeling of Stress : To some extent  Social Connections: Socially Integrated (08/14/2020)   Social Connection and Isolation Panel [NHANES]    Frequency of Communication with Friends and Family: More than three  times a week    Frequency of Social Gatherings with Friends and Family: More than three times a week    Attends Religious Services: More than 4 times per year    Active Member of [employer] or Organizations: Yes    Attends Engineer, structural: More than 4 times per year    Marital Status: Married  Catering manager Violence: Not At Risk (10/15/2020)   Humiliation, Afraid, Rape, and Kick questionnaire    Fear of Current or Ex-Partner: No    Emotionally Abused: No    Physically Abused: No    Sexually Abused: No    Family History  Problem Relation Age of Onset   Diabetes Mother    Hypertension Mother    Heart Problems Mother    Hyperlipidemia Mother    Asthma Mother    Sleep apnea Mother    Obesity Mother    Colon polyps Mother    Diabetes Father    Kidney cancer Father    Hypertension Father    Hyperlipidemia Father    Sleep apnea Father    Hypertension Sister    Allergic rhinitis Sister    Hyperlipidemia Sister    Liver disease Brother    Arthritis Brother    Hypertension Brother    Hyperlipidemia Brother    Hypertension Brother    Hyperlipidemia Brother  Lupus Maternal Aunt    Breast cancer Maternal Grandmother    Kidney disease Maternal Grandfather    Alcoholism Maternal Grandfather    Breast cancer Paternal Grandmother    Hypertension Daughter    Hyperlipidemia Daughter    Eczema Daughter    Hypertension Son    Stomach cancer Other        aunt   Colon cancer Neg Hx    Immunodeficiency Neg Hx    Urticaria Neg Hx    Prostate cancer Neg Hx    Rectal cancer Neg Hx    Esophageal cancer Neg Hx     Past Surgical History:  Procedure Laterality Date   ABDOMINAL HYSTERECTOMY  02/18/2000   CARDIAC CATHETERIZATION  08/18/2003   normal L main/LAD/L Cfx/RCA (Dr. Erlene Quan)   COLONOSCOPY  2006   Dr. Franky Macho, hyperplastic polyps   COLONOSCOPY  08/06/2011   abnormal terminal ileum for 10cm, erosions, geographical ulceration. Bx small bowel mucosa with  prominent intramucosal lymphoid aggregates, slightly inflammed   COLONOSCOPY WITH PROPOFOL N/A 12/28/2019   Rourk: 4 sessile serrated adenomas removed on the colon.  Next colonoscopy in 3 years.   DIRECT LARYNGOSCOPY  03/15/2012   Procedure: DIRECT LARYNGOSCOPY;  Surgeon: Flo Shanks, MD;  Location: [facility] OR;  Service: ENT;  Laterality: N/A; Dr. Wolicki--:>no foreign body seen. normal esophagus to 40cm   ESOPHAGEAL DILATION  04/17/2015   Procedure: ESOPHAGEAL DILATION;  Surgeon: Corbin Ade, MD;  Location: [facility];  Service: Endoscopy;;   ESOPHAGOGASTRODUODENOSCOPY  08/23/2008   ZOX:WRUE distal esophageal erosions consistent with mild erosive reflux esophagitis, otherwise unremarkable esophagus/ Tiny antral erosions of doubtful clinical significance, otherwise normal stomach, patent pylorus, normal D1 and D2   ESOPHAGOGASTRODUODENOSCOPY  08/06/2011   small hh, noncritical Schatzki's ring s/p 58 F   ESOPHAGOGASTRODUODENOSCOPY N/A 04/17/2015   [id number] s/p dilation   ESOPHAGOGASTRODUODENOSCOPY (EGD) WITH PROPOFOL N/A 01/20/2018   Dr. Jena Gauss: Erosive gastropathy, normal-appearing esophagus status post empiric dilation.  Chronic gastritis, no H. pylori.   ESOPHAGOSCOPY  03/15/2012   Procedure: ESOPHAGOSCOPY;  Surgeon: Flo Shanks, MD;  Location: [facility] OR;  Service: ENT;  Laterality: N/A;   KNEE ARTHROSCOPY Left 04/27/2001   MALONEY DILATION N/A 01/20/2018   Procedure: Elease Hashimoto DILATION;  Surgeon: Corbin Ade, MD;  Location: [facility];  Service: Endoscopy;  Laterality: N/A;   NM MYOCAR PERF WALL MOTION  2003   negative bruce protocol exercise tress test; EF 68%; intermediate risk study due to evidence of anterior wall ischemia extending from mid-ventricle to apex   PITUITARY SURGERY  06/2012   benign tumor, surgeon at [facility]   POLYPECTOMY  12/28/2019   Procedure: POLYPECTOMY;  Surgeon: Corbin Ade, MD;  Location: [facility];  Service: Endoscopy;;   RECTOCELE REPAIR      TRANSTHORACIC ECHOCARDIOGRAM  2003   EF normal   VAGINAL PROLAPSE REPAIR     VIDEO BRONCHOSCOPY Bilateral 03/08/2017   Procedure: VIDEO BRONCHOSCOPY WITHOUT FLUORO;  Surgeon: Roslynn Amble, MD;  Location: [facility] ENDOSCOPY;  Service: Cardiopulmonary;  Laterality: Bilateral;    ROS: Review of Systems Negative except as stated above  PHYSICAL EXAM: BP 112/70 (BP Location: Left Arm, Patient Position: Sitting, Cuff Size: Large)   Pulse (!) 55   Temp 98.4 F (36.9 C) (Oral)   Ht 5\' 8"  (1.727 m)   Wt 270 lb (122.5 kg)   SpO2 98%   BMI 41.05 kg/m   Wt Readings from Last 3 Encounters:  03/15/23 270  lb (122.5 kg)  03/11/23 265 lb (120.2 kg)  03/04/23 268 lb (121.6 kg)    Physical Exam  General appearance - alert, well appearing, morbidly obese African-American female and in no distress Mental status - normal mood, behavior, speech, dress, motor activity, and thought processes Chest - clear to auscultation, no wheezes, rales or rhonchi, symmetric air entry Heart - normal rate, regular rhythm, normal S1, S2, no murmurs, rubs, clicks or gallops Pelvic -CMA Clarissa present: No external vaginal lesions.  She has mild bulging of a rectocele/vaginal floor but does not go past the vagina opening. Extremities - peripheral pulses normal, no pedal edema, no clubbing or cyanosis  Results for orders placed or performed in visit on 03/15/23  POCT glucose (manual entry)  Result Value Ref Range   POC Glucose 94 70 - 99 mg/dl  POCT glycosylated hemoglobin (Hb A1C)  Result Value Ref Range   Hemoglobin A1C     HbA1c POC (<> result, manual entry)     HbA1c, POC (prediabetic range)     HbA1c, POC (controlled diabetic range) 5.4 0.0 - 7.0 %  POCT URINALYSIS DIP (CLINITEK)  Result Value Ref Range   Color, UA yellow yellow   Clarity, UA clear clear   Glucose, UA negative negative mg/dL   Bilirubin, UA negative negative   Ketones, POC UA negative negative mg/dL   Spec Grav, UA 1.610 9.604 - 1.025    Blood, UA negative negative   pH, UA 7.5 5.0 - 8.0   POC PROTEIN,UA negative negative, trace   Urobilinogen, UA 0.2 0.2 or 1.0 E.U./dL   Nitrite, UA Negative Negative   Leukocytes, UA Negative Negative   *Note: Due to a large number of results and/or encounters for the requested time period, some results have not been displayed. A complete set of results can be found in Results Review.        Latest Ref Rng & Units 10/06/2022   10:27 AM 11/17/2021    9:41 AM 01/04/2021    3:55 PM  CMP  Glucose 70 - 99 mg/dL 540  82  981   BUN 8 - 27 mg/dL 16  11  10    Creatinine 0.57 - 1.00 mg/dL 1.91  4.78  2.95   Sodium mmol/L CANCELED  143  137   Potassium mmol/L CANCELED  4.4  3.5   Chloride mmol/L CANCELED  103  100   CO2 20 - 29 mmol/L 22  26  29    Calcium 8.7 - 10.3 mg/dL 62.1  9.9  9.6   Total Protein 6.0 - 8.5 g/dL 8.2  7.4  7.9   Total Bilirubin 0.0 - 1.2 mg/dL 0.4  0.4  0.6   Alkaline Phos 44 - 121 IU/L 74  70  53   AST 0 - 40 IU/L 21  20  32   ALT 0 - 32 IU/L 16  20  35    Lipid Panel     Component Value Date/Time   CHOL 140 10/06/2022 1027   TRIG 71 10/06/2022 1027   HDL 58 10/06/2022 1027   CHOLHDL 2.2 07/10/2022 1036   CHOLHDL 2.3 12/16/2015 0950   VLDL 13 12/16/2015 0950   LDLCALC 68 10/06/2022 1027    CBC    Component Value Date/Time   WBC 7.7 07/10/2022 1036   WBC 12.0 (H) 01/04/2021 1555   RBC 4.26 07/10/2022 1036   RBC 4.34 01/04/2021 1555   HGB 12.4 07/10/2022 1036   HCT 38.7 07/10/2022 1036  HCT 41 06/25/2011 1053   PLT 365 07/10/2022 1036   MCV 91 07/10/2022 1036   MCV 91.1 06/25/2011 1053   MCH 29.1 07/10/2022 1036   MCH 29.5 01/04/2021 1555   MCHC 32.0 07/10/2022 1036   MCHC 31.2 01/04/2021 1555   RDW 11.9 07/10/2022 1036   LYMPHSABS 4.6 (H) 01/04/2021 1555   LYMPHSABS 3.4 (H) 01/20/2017 1001   MONOABS 1.6 (H) 01/04/2021 1555   EOSABS 0.1 01/04/2021 1555   EOSABS 0.1 01/20/2017 1001   BASOSABS 0.1 01/04/2021 1555   BASOSABS 0.0 01/20/2017 1001     ASSESSMENT AND PLAN: 1. Type 2 diabetes mellitus with morbid obesity (HCC) At goal.  Continue Trulicity, metformin and Topamax. Doing well through medical weight management.  Encouraged her to continue trying to eat healthy. - POCT glucose (manual entry) - POCT glycosylated hemoglobin (Hb A1C) - atorvastatin (LIPITOR) 40 MG tablet; Take 1 tablet (40 mg total) by mouth every evening.  Dispense: 90 tablet; Refill: 1  2. Hypertension associated with diabetes (HCC) At goal.  Continue medications listed above. - spironolactone (ALDACTONE) 25 MG tablet; Take 1 tablet (25 mg total) by mouth every morning.  Dispense: 90 tablet; Refill: 1 - losartan (COZAAR) 25 MG tablet; Take 1 tablet (25 mg total) by mouth every evening.  Dispense: 90 tablet; Refill: 2 - metoprolol tartrate (LOPRESSOR) 25 MG tablet; Take 1 tablet (25 mg total) by mouth 2 (two) times daily.  Dispense: 180 tablet; Refill: 1  3. Dysuria UA is negative.  We will send urine for culture. - POCT URINALYSIS DIP (CLINITEK) - Urine Culture  4. Moderate major depression (HCC) Plugged in with behavioral health.  5. Decreased mobility Due to arthritis in the knees and hips.  However she has lost some weight.  Encouraged her to try to get out of the mobility chair is much as she can.  She is interested in doing some physical therapy to help strengthen her legs. - Ambulatory referral to Physical Therapy  6. Other osteoarthritis involving multiple joints See #5 above.  Continue Celebrex.  7. Hypothyroidism (acquired) - TSH  8. Pituitary adenoma (HCC) Last MRI in 2020 showed residual adenoma stable in size.  Will be due for f/u with [facility] neurosurgery in the fall of 2025  9. Rectocele - Ambulatory referral to Urogynecology    Patient was given the opportunity to ask questions.  Patient verbalized understanding of the plan and was able to repeat key elements of the plan.   This documentation was completed using Social research officer, government.  Any transcriptional errors are unintentional.  Orders Placed This Encounter  Procedures   POCT glucose (manual entry)   POCT glycosylated hemoglobin (Hb A1C)     Requested Prescriptions   Pending Prescriptions Disp Refills   atorvastatin (LIPITOR) 40 MG tablet 90 tablet 0    Sig: Take 1 tablet (40 mg total) by mouth every evening.   spironolactone (ALDACTONE) 25 MG tablet 90 tablet 1    Sig: Take 1 tablet (25 mg total) by mouth every morning.   albuterol (VENTOLIN HFA) 108 (90 Base) MCG/ACT inhaler 18 g 2   losartan (COZAAR) 25 MG tablet 90 tablet 2    Sig: Take 1 tablet (25 mg total) by mouth every evening.   esomeprazole (NEXIUM) 40 MG capsule 60 capsule 0    Sig: Take 1 capsule (40 mg total) by mouth 2 (two) times daily.    No follow-ups on file.  Jonah Blue, MD, FACP

## 2023-03-15 NOTE — Progress Notes (Unsigned)
Chief Complaint:   OBESITY Robin [name] is here to discuss her progress with her obesity treatment plan along with follow-up of her obesity related diagnoses. Robin [name] is on the Category 3 Plan and states she is following her eating plan approximately 40% of the time. Robin [name] states she is doing chair exercise for 30-45 minutes 3-4 times per week.  Today's visit was #: 23 Starting weight: 310 lbs Starting date: 06/03/2021 Today's weight: 265 lbs Today's date: 03/11/2023 Total lbs lost to date: 45 Total lbs lost since last in-office visit: 8  Interim History: Patient is down 8 lbs since her last visit, and she is doing better overall. She is portion sizing and and eating more fruits, and increasing her water and protein shakes.   Subjective:   1. Type 2 diabetes mellitus with obesity (HCC) Patient is taking Trulicity.   2. Other disorder of eating Patient is taking Topamax.   Assessment/Plan:   1. Type 2 diabetes mellitus with obesity (HCC) Patient will continue Trulicity 4.5 mg, and we will refill for 1 month.   - Dulaglutide (TRULICITY) 4.5 MG/0.5ML SOPN; Inject 4.5 mg as directed once a week.  Dispense: 2 mL; Refill: 0  2. Other disorder of eating Patient will continue Topamax 50 mg, and we will refill for 1 month.   - topiramate (TOPAMAX) 50 MG tablet; Take 1 tablet (50 mg total) by mouth 2 (two) times daily.  Dispense: 60 tablet; Refill: 0  3. Morbid obesity (HCC)  4. Morbid obesity with body mass index (BMI) of 40.0 to 44.9 in adult Robin [name]) Robin [name] is currently in the action stage of change. As such, her goal is to continue with weight loss efforts. She has agreed to the Category 3 Plan.   Exercise goals: As is.   Behavioral modification strategies: increasing lean protein intake, decreasing simple carbohydrates, increasing vegetables, increasing water intake, decreasing eating out, no skipping meals, meal planning and cooking strategies, keeping healthy foods in the home,  and planning for success.  Robin [name] has agreed to follow-up with our clinic in 4 weeks. She was informed of the importance of frequent follow-up visits to maximize her success with intensive lifestyle modifications for her multiple health conditions.   Objective:   Blood pressure 123/65, pulse 68, temperature 98.5 F (36.9 C), height 5\' 8"  (1.727 m), weight 265 lb (120.2 kg), SpO2 99 %. Body mass index is 40.29 kg/m.  General: Cooperative, alert, well developed, in no acute distress. HEENT: Conjunctivae and lids unremarkable. Cardiovascular: Regular rhythm.  Lungs: Normal work of breathing. Neurologic: No focal deficits.   Lab Results  Component Value Date   CREATININE 0.96 10/06/2022   BUN 16 10/06/2022   NA CANCELED 10/06/2022   K CANCELED 10/06/2022   CL CANCELED 10/06/2022   CO2 22 10/06/2022   Lab Results  Component Value Date   ALT 16 10/06/2022   AST 21 10/06/2022   ALKPHOS 74 10/06/2022   BILITOT 0.4 10/06/2022   Lab Results  Component Value Date   HGBA1C 5.4 03/15/2023   HGBA1C 5.3 10/06/2022   HGBA1C 6.3 07/10/2022   HGBA1C 5.3 03/10/2022   HGBA1C 5.7 (H) 11/17/2021   Lab Results  Component Value Date   INSULIN 28.2 (H) 11/17/2021   INSULIN 48.4 (H) 06/03/2021   Lab Results  Component Value Date   TSH 2.850 11/12/2022   Lab Results  Component Value Date   CHOL 140 10/06/2022   HDL 58 10/06/2022   LDLCALC 68 10/06/2022  TRIG 71 10/06/2022   CHOLHDL 2.2 07/10/2022   Lab Results  Component Value Date   VD25OH 37.7 10/06/2022   VD25OH 61.7 06/03/2021   Lab Results  Component Value Date   WBC 7.7 07/10/2022   HGB 12.4 07/10/2022   HCT 38.7 07/10/2022   MCV 91 07/10/2022   PLT 365 07/10/2022   Lab Results  Component Value Date   IRON 31 03/04/2015   TIBC 340 03/04/2015   FERRITIN 34 03/04/2015   Attestation Statements:   Reviewed by clinician on day of visit: allergies, medications, problem list, medical history, surgical history,  family history, social history, and previous encounter notes.   Trude Mcburney, am acting as Energy manager for [employer], DO.  I have reviewed the above documentation for accuracy and completeness, and I agree with the above. Corinna Capra, DO

## 2023-03-16 ENCOUNTER — Encounter: Payer: Medicaid Other | Admitting: Gastroenterology

## 2023-03-16 LAB — TSH: TSH: 0.588 u[IU]/mL (ref 0.450–4.500)
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
## 2023-03-17 LAB — URINE CULTURE

## 2023-03-17 NOTE — Therapy (Addendum)
OUTPATIENT PHYSICAL THERAPY EVALUATION   Patient Name: BHARGAVI WALLOCK MRN: [id number] DOB:1956/05/28, 67 y.o., female Today's Date: 03/22/2023  END OF SESSION:  PT End of Session - 03/22/23 0936     Visit Number 1    Date for PT Re-Evaluation 05/17/23    Authorization Type Traditional Medicaid    PT Start Time 0936   Pt arrived late   PT Stop Time 1018    PT Time Calculation (min) 42 min    Activity Tolerance Patient tolerated treatment well             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   Anemia    Anxiety disorder    Arthritis    Asthma    Back pain    Chronic neck pain    Colon polyps    Constipation    Depression    Diabetes mellitus without complication (HCC)    Dyspnea    Edema of lower extremity    Fibromyalgia    GERD (gastroesophageal reflux disease)    Heartburn    Hiatal hernia    small   Hyperlipidemia    Hypertension    Hypothyroidism    Joint pain    Morbid obesity with BMI of 50.0-59.9, adult (HCC)    Osteoarthritis    Other fatigue    Schatzki's ring    non critical   Severe episode of recurrent major depressive disorder, with psychotic features (HCC) 05/26/2018   Sinusitis    Sleep apnea    CPAP, Sleep study at [facility]   SOB (shortness of breath) on exertion    Status post dilation of esophageal narrowing    Swallowing difficulty    Past Surgical History:  Procedure Laterality Date   ABDOMINAL HYSTERECTOMY  02/18/2000   CARDIAC CATHETERIZATION  08/18/2003   normal L main/LAD/L Cfx/RCA (Dr. Erlene Quan)   COLONOSCOPY  2006   Dr. Franky Macho, hyperplastic polyps   COLONOSCOPY  08/06/2011   abnormal terminal ileum for 10cm, erosions, geographical ulceration. Bx small bowel mucosa with prominent intramucosal lymphoid aggregates, slightly inflammed   COLONOSCOPY WITH PROPOFOL N/A 12/28/2019   Rourk: 4 sessile serrated adenomas removed on  the colon.  Next colonoscopy in 3 years.   DIRECT LARYNGOSCOPY  03/15/2012   Procedure: DIRECT LARYNGOSCOPY;  Surgeon: Flo Shanks, MD;  Location: [facility] OR;  Service: ENT;  Laterality: N/A; Dr. Wolicki--:>no foreign body seen. normal esophagus to 40cm   ESOPHAGEAL DILATION  04/17/2015   Procedure: ESOPHAGEAL DILATION;  Surgeon: Corbin Ade, MD;  [facility];  Service: Endoscopy;;   ESOPHAGOGASTRODUODENOSCOPY  08/23/2008   XBJ:YNWG distal esophageal erosions consistent with mild erosive reflux esophagitis, otherwise unremarkable esophagus/ Tiny antral erosions of doubtful clinical significance, otherwise normal stomach, patent pylorus, normal D1 and D2   ESOPHAGOGASTRODUODENOSCOPY  08/06/2011   small hh, noncritical Schatzki's ring s/p 56 F   ESOPHAGOGASTRODUODENOSCOPY N/A 04/17/2015   NFA:OZHYQM s/p dilation   ESOPHAGOGASTRODUODENOSCOPY (EGD) WITH PROPOFOL N/A 01/20/2018   Dr. Jena Gauss: Erosive gastropathy, normal-appearing esophagus status post empiric dilation.  Chronic gastritis, no H. pylori.   ESOPHAGOSCOPY  03/15/2012   Procedure: ESOPHAGOSCOPY;  Surgeon: Flo Shanks, MD;  Location: [facility] OR;  Service: ENT;  Laterality: N/A;   KNEE ARTHROSCOPY Left 04/27/2001   MALONEY DILATION N/A 01/20/2018   Procedure: Elease Hashimoto DILATION;  Surgeon: Corbin Ade, MD;  Location: [facility];  Service:  Endoscopy;  Laterality: N/A;   NM MYOCAR PERF WALL MOTION  2003   negative bruce protocol exercise tress test; EF 68%; intermediate risk study due to evidence of anterior wall ischemia extending from mid-ventricle to apex   PITUITARY SURGERY  06/2012   benign tumor, surgeon at [facility]   POLYPECTOMY  12/28/2019   Procedure: POLYPECTOMY;  Surgeon: Corbin Ade, MD;  Location: [facility];  Service: Endoscopy;;   RECTOCELE REPAIR     TRANSTHORACIC ECHOCARDIOGRAM  2003   EF normal   VAGINAL PROLAPSE REPAIR     VIDEO BRONCHOSCOPY Bilateral 03/08/2017   Procedure: VIDEO BRONCHOSCOPY  WITHOUT FLUORO;  Surgeon: Roslynn Amble, MD;  Location: [facility] ENDOSCOPY;  Service: Cardiopulmonary;  Laterality: Bilateral;   Patient Active Problem List   Diagnosis Date Noted   Type 2 diabetes mellitus with obesity (HCC) 02/04/2023   Moderate major depression (HCC) 11/12/2022   BMI 40.0-44.9, adult (HCC) 11/03/2022   B12 deficiency 10/06/2022   Depression 07/09/2022   Class 3 severe obesity with serious comorbidity and body mass index (BMI) of 45.0 to 49.9 in adult [facility]) 07/09/2022   Major depressive disorder, recurrent episode (HCC) 07/02/2022   Generalized anxiety disorder 07/02/2022   Body mass index 40.0-44.9, adult (HCC) 02/25/2022   Morbid obesity (HCC) 02/25/2022   Right knee pain 02/18/2022   Diabetes mellitus type 2 in obese 02/18/2022   Lumbar foraminal stenosis 01/23/2022   Lumbar radiculopathy 01/23/2022   Pain in both knees 01/23/2022   Vitamin D deficiency 11/18/2021   Polyp of descending colon 02/15/2020   Pituitary tumor 08/21/2019   Primary osteoarthritis of both hips 08/21/2019   Primary osteoarthritis of both knees 08/21/2019   Pain in left shoulder 08/21/2019   Morbid obesity with BMI of 60.0-69.9, adult (HCC) 07/17/2019   Pedal edema 07/17/2019   Multilevel degenerative disc disease 10/31/2018   Change in bowel function 10/28/2018   Gastritis and gastroduodenitis 04/26/2018   Atrophic vaginitis 04/01/2018   Cough, persistent 02/08/2018   Dysphagia 12/14/2017   Generalized OA 07/30/2017   Urinary incontinence 07/23/2017   Early satiety 06/16/2017   Hx of iron deficiency anemia 05/28/2017   Throat pain in adult 04/05/2017   Prediabetes 02/04/2017   Dyspnea on exertion 12/23/2016   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 12/23/2016   Hemoptysis 12/23/2016   Fibromyalgia 08/18/2016   Chronic lymphocytic thyroiditis 08/18/2016   Persistent depressive disorder 04/01/2016   OSA (obstructive sleep apnea)  04/01/2016   Essential hypertension 12/09/2015   Hypothyroidism 12/09/2015   Morbid obesity due to excess calories (HCC) 12/09/2015   Constipation 05/27/2015   Fatty liver 05/27/2015   Abdominal pain, chronic, epigastric 07/04/2012   History of gastroesophageal reflux (GERD) 06/09/2012   Hyperlipidemia 06/09/2012   Refusal of blood transfusions as patient is Jehovah's Witness 06/09/2012   Anxiety 06/09/2012   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 OA in knees  is so bad that it prevents her from walking as she is unable to fully straighten her knees. She wants Dr. Roda Shutters to do TKR but unable to have TKR due limited ROM and being non-ambulatory. She does walk some in her home with a RW, and occasionally outdoors with her husband, but uses power wc majority of the time and when going out. She reports it has been 6-7 yrs since she has walked regularly. She sleeps curled up on her side in bed. She had been going to pain management but had to stop due to insurance changes, but is hoping to start back.  PAIN: Are you having pain? Yes: NPRS scale: 7/10 Pain location: B knees (L>R) Pain description: sharp, achy Aggravating factors: straightening her knees, standing and walking Relieving factors: exercise, ice, heat, pain meds  Are you having pain? Yes: NPRS scale: 7/10 Pain location: B hips (R>L) Pain  description: sharp, achy Aggravating factors: moving in the bed Relieving factors: exercise, ice, heat, pain meds  PERTINENT HISTORY: HTN, DM, OSA, fibromyalgia, depression, prediabetes, DOE, obesity, GERD, hypothyroidism, OA of multiple joints (neck, shoulders, elbows, hands, back, hips, knees), multilevel DDD, lumbar radiculopathy, OSA, pituitary adenoma status post resection   PRECAUTIONS: Fall  HAND DOMINANCE: Right  WEIGHT BEARING RESTRICTIONS: No  FALLS:  Has patient fallen in last 6 months? No  LIVING ENVIRONMENT: Lives with: lives with their spouse Lives in: House/apartment Stairs: Yes: External: 3 steps; ramp available Has following equipment at home: Counselling psychologist, Environmental consultant - 2 wheeled, Wheelchair (power), shower chair, bed side commode, Grab bars, and Ramped entry  OCCUPATION: Retired  PLOF: Independent with basic ADLs, Independent with household mobility with device, Requires assistive device for independence, Needs assistance with homemaking, and Leisure: sedentary  PATIENT GOALS: "I want to get mobile enough to have TKR surgery."   OBJECTIVE: (objective measures completed at initial evaluation unless otherwise dated)  DIAGNOSTIC FINDINGS:  02/25/22 - B knee x-rays:  Advanced tricompartmental degenerative joint disease. Bone-on-bone joint space narrowing. Varus deformity.  PATIENT SURVEYS:  ABC scale 260 / 1600 = 16.3 % LEFS 38 / 80 = 47.5 %  COGNITION: Overall cognitive status: Within functional limits for tasks assessed     SENSATION: WFL  MUSCLE LENGTH: Hamstrings: severely tight B ITB: NT Piriformis: mod/severe tight B Hip flexors: severely tight B Quads: mod/severe tight B Heelcord: NT  POSTURE:  flexed trunk  and severe crouch in standing d/t hip and knee flexion contractures  LOWER EXTREMITY ROM:  Active & Passive ROM Right eval Left eval  Hip flexion    Hip extension -30 -30  Hip abduction    Hip adduction    Hip internal rotation     Hip external rotation    Knee flexion 120 112  Knee extension -31 -43  Ankle dorsiflexion    Ankle plantarflexion    Ankle inversion    Ankle eversion     (Blank rows = not tested)  LOWER EXTREMITY MMT:  MMT Right eval Left eval  Hip flexion 4+ 4+  Hip extension 4 for available ROM 4 for available ROM  Hip abduction 4- 4-  Hip adduction 4 4  Hip internal rotation 4+ 4+  Hip external rotation 4 4-  Knee flexion 4+ 4+  Knee extension 4+ for available ROM 4+ for available ROM  Ankle dorsiflexion 4+ 4+  Ankle plantarflexion    Ankle inversion    Ankle eversion     (Blank rows = not tested)  FUNCTIONAL  TESTS:  5 times sit to stand: TBA Timed up and go (TUG): TBA 10 meter walk test: TBA  BED MOBILITY:  Sit to supine SBA Supine to sit Mod A Rolling to Right Min A Rolling to Left Min A  TRANSFERS: Assistive device utilized: Environmental consultant - 2 wheeled  Sit to stand: SBA Stand to sit: SBA Chair to chair: SBA Floor:  NT  GAIT: Distance walked: 3-4 steps from power wc to treatment table Assistive device utilized: Environmental consultant - 2 wheeled Level of assistance: CGA Gait pattern: step to pattern, decreased stride length, knee flexed in stance- Right, knee flexed in stance- Left, shuffling, antalgic, and trunk flexed Comments: severe crouch in standing/with gait d/t B hip and knee flexion contractures  RAMP: Level of Assistance:  NT Assistive device utilized:  NT Ramp Comments:   CURB:  Level of Assistance:  NT Assistive device utilized:  NT Curb Comments:   STAIRS:  Level of Assistance:  NT  Stair Negotiation Technique:   Number of Stairs:    Height of Stairs:   Comments:    TODAY'S TREATMENT:  03/22/23 Initial eval Educated pt on positional stretch laying supine in bed to promote B hip and knee extension stretch   PATIENT EDUCATION:  Education details: PT eval findings, anticipated POC, and positioning in bed to promote hip and knee extension Person educated:  Patient Education method: Explanation Education comprehension: verbalized understanding  HOME EXERCISE PROGRAM: TBD  ASSESSMENT:  CLINICAL IMPRESSION: GURTIE BIALY is a 67 y.o. female who was seen today for physical therapy evaluation and treatment for decreased mobility.  She reports severe osteoarthritis in the majority of her joints, bilateral hips (R>L) and knees (L>R) being the worst.  She reports she is seeing Dr. Roda Shutters for possible TKR but he is unable to do surgery due to severe hip and knee flexion contractures as well as inability to safely ambulate.  Current deficits include B hip and knee pain, limited LE ROM with 30+ degree B hip and knee flexion contractures, decreased LE flexibility, decreased LE strength, and impaired transfers and gait due to LE flexion contractures.  Balance likely impaired due to limited mobility but will complete formal assessment next visit.  Patient educated on positional stretching in bed to promote hip and knee extension and will create formal HEP for lower extremity stretching on next visit.  Ayanni will benefit from skilled PT to address above deficits to improve mobility and activity tolerance with decreased pain interference.   OBJECTIVE IMPAIRMENTS: Abnormal gait, decreased activity tolerance, decreased balance, decreased endurance, decreased knowledge of condition, decreased knowledge of use of DME, decreased mobility, difficulty walking, decreased ROM, decreased strength, decreased safety awareness, hypomobility, increased fascial restrictions, impaired perceived functional ability, increased muscle spasms, impaired flexibility, improper body mechanics, postural dysfunction, and pain.   ACTIVITY LIMITATIONS: carrying, lifting, standing, squatting, sleeping, stairs, transfers, bed mobility, locomotion level, and caring for others  PARTICIPATION LIMITATIONS: meal prep, cleaning, laundry, driving, shopping, community activity, and yard work  PERSONAL  FACTORS: Education, Financial risk analyst, Past/current experiences, Time since onset of injury/illness/exacerbation, and 3+ comorbidities: HTN, DM, OSA, fibromyalgia, depression, prediabetes, DOE, obesity, GERD, hypothyroidism, OA of multiple joints (neck, shoulders, elbows, hands, back, hips, knees), multilevel DDD, lumbar radiculopathy, OSA, pituitary adenoma status post resection  are also affecting patient's functional outcome.   REHAB POTENTIAL: Fair due to severity of contractures and prolonged limited mobility over several years  CLINICAL DECISION MAKING: Evolving/moderate complexity  EVALUATION COMPLEXITY: Moderate   GOALS: Goals reviewed with patient?  Yes  SHORT TERM GOALS: Target date: 04/19/2023   Patient will be independent with initial HEP. Baseline: Formal HEP to be established next visit.  Education provided on positional stretching for hip and knee extension in bed on eval. Goal status: INITIAL  2.  Patient will demonstrate >/= 5 degree reduction in B hip and knee flexion contractures. Baseline: Refer to above LE ROM table. Goal status: INITIAL  3.  Patient will be able to ambulate 30+ ft with RW and SBA of PT, demonstrating improved upright posture for more normal gait pattern to increase functional ambulation in home.  Baseline: 3-4 steps with RW in severe crouch with CGA of PT Goal status: INITIAL  LONG TERM GOALS: Target date: 05/17/2023   Patient will be independent with advanced/ongoing HEP to improve outcomes and carryover.  Baseline:  Goal status: INITIAL  2.  Patient will report at least >/= 50% improvement in B hip and knee pain to improve QOL. Baseline: 7/10 Goal status: INITIAL  3.  Patient will demonstrate improved B hip and knee AROM to [facility] to allow for normal stance and gait mechanics. Baseline: Refer to above LE ROM table. Goal status: INITIAL  4.  Patient will demonstrate improved B LE strength to >/= 4+/5 for improved stability and ease of  mobility. Baseline: Refer to above LE MMT table Goal status: INITIAL  5.  Patient will be able to ambulate 50-75 ft with RW or LRAD and improved upright posture for more normal gait pattern to increase functional ambulation in home.  Baseline: 3-4 steps with RW in severe crouch due to B hip and knee flexion contracture Goal status: INITIAL  6.  Patient will report >/= 47/80 on LEFS to demonstrate improved functional ability. Baseline: 38 / 80 = 47.5 % Goal status: INITIAL  7.  Patient will report >/= 35% on ABC scale to demonstrate improved functional ability and activity confidence with decreased fear of falling. Baseline: 260 / 1600 = 16.3 % Goal status: INITIAL  8.  Patient will demonstrate decreased TUG time by >/= 10 sec to decrease risk for falls with transitional mobility. Baseline: TBD Goal status: INITIAL    PLAN:  PT FREQUENCY: 1-2x/week  PT DURATION: 8 weeks  PLANNED INTERVENTIONS: Therapeutic exercises, Therapeutic activity, Neuromuscular re-education, Balance training, Gait training, Patient/Family education, Self Care, Joint mobilization, Stair training, Aquatic Therapy, Dry Needling, Electrical stimulation, Cryotherapy, Moist heat, Taping, Vasopneumatic device, Ultrasound, Ionotophoresis 4mg /ml Dexamethasone, Manual therapy, and Re-evaluation  PLAN FOR NEXT SESSION: Complete balance testing & further gait assessment - 5xSTS, TUG & ; create HEP for LE stretching   Marry Guan, PT 03/22/2023, 12:00 PM

## 2023-03-18 ENCOUNTER — Telehealth (INDEPENDENT_AMBULATORY_CARE_PROVIDER_SITE_OTHER): Payer: Self-pay | Admitting: Bariatrics

## 2023-03-18 ENCOUNTER — Encounter (HOSPITAL_COMMUNITY): Payer: Self-pay | Admitting: Psychiatry

## 2023-03-18 ENCOUNTER — Ambulatory Visit (INDEPENDENT_AMBULATORY_CARE_PROVIDER_SITE_OTHER): Payer: Medicaid Other | Admitting: Psychiatry

## 2023-03-18 ENCOUNTER — Encounter: Payer: Self-pay | Admitting: Bariatrics

## 2023-03-18 DIAGNOSIS — F411 Generalized anxiety disorder: Secondary | ICD-10-CM | POA: Diagnosis not present

## 2023-03-18 DIAGNOSIS — F341 Dysthymic disorder: Secondary | ICD-10-CM

## 2023-03-18 MED ORDER — DULOXETINE HCL 60 MG PO CPEP
60.0000 mg | ORAL_CAPSULE | Freq: Two times a day (BID) | ORAL | 2 refills | Status: AC
Start: 2023-03-18 — End: 2023-06-16

## 2023-03-18 MED ORDER — BUPROPION HCL ER (XL) 300 MG PO TB24
300.0000 mg | ORAL_TABLET | Freq: Every day | ORAL | 2 refills | Status: DC
Start: 2023-03-18 — End: 2023-06-01

## 2023-03-18 NOTE — Telephone Encounter (Signed)
Left message for pharmacy to return call to clarify what they need for the prescription.

## 2023-03-18 NOTE — Patient Instructions (Signed)
Thank you for attending your appointment today.  -- INCREASE Cymbalta to 60 mg twice daily -- Continue other medications as prescribed.  Please do not make any changes to medications without first discussing with your provider. If you are experiencing a psychiatric emergency, please call 911 or present to your nearest emergency department. Additional crisis, medication management, and therapy resources are included below.  [facility]  [address] [place], [place] [address] [phone] WALK-IN URGENT CARE 24/7 FOR ANYONE [address] [place], [place]  [phone] Fax: [phone] [email or web address] *Interpreters available *Accepts all insurance and uninsured for Urgent Care needs *Accepts Medicaid and uninsured for outpatient treatment (below)      ONLY FOR [facility]  Below:    Outpatient New Patient Assessment/Therapy Walk-ins:        Monday -Thursday 8am until slots are full.        Every Friday 1pm-4pm  (first come, first served)                   New Patient Psychiatry/Medication Management        Monday-Friday 8am-11am (first come, first served)               For all walk-ins we ask that you arrive by 7:15am, because patients will be seen in the order of arrival.

## 2023-03-18 NOTE — Telephone Encounter (Signed)
6/12 [facility] is calling because the subcriber id is missing and the wanted to to see if a another doctor could fill it. Erie Noe

## 2023-03-22 ENCOUNTER — Ambulatory Visit: Payer: Medicaid Other | Attending: Internal Medicine | Admitting: Physical Therapy

## 2023-03-22 ENCOUNTER — Encounter: Payer: Self-pay | Admitting: Physical Therapy

## 2023-03-22 ENCOUNTER — Other Ambulatory Visit: Payer: Self-pay

## 2023-03-22 DIAGNOSIS — M6281 Muscle weakness (generalized): Secondary | ICD-10-CM | POA: Diagnosis present

## 2023-03-22 DIAGNOSIS — M6249 Contracture of muscle, multiple sites: Secondary | ICD-10-CM | POA: Diagnosis present

## 2023-03-22 DIAGNOSIS — R262 Difficulty in walking, not elsewhere classified: Secondary | ICD-10-CM | POA: Diagnosis present

## 2023-03-22 DIAGNOSIS — R2689 Other abnormalities of gait and mobility: Secondary | ICD-10-CM | POA: Insufficient documentation

## 2023-03-24 ENCOUNTER — Ambulatory Visit: Payer: Medicaid Other | Admitting: Physical Therapy

## 2023-03-24 ENCOUNTER — Encounter: Payer: Self-pay | Admitting: Physical Therapy

## 2023-03-24 ENCOUNTER — Telehealth: Payer: Self-pay

## 2023-03-24 DIAGNOSIS — R262 Difficulty in walking, not elsewhere classified: Secondary | ICD-10-CM

## 2023-03-24 DIAGNOSIS — R2689 Other abnormalities of gait and mobility: Secondary | ICD-10-CM | POA: Diagnosis not present

## 2023-03-24 DIAGNOSIS — M6249 Contracture of muscle, multiple sites: Secondary | ICD-10-CM

## 2023-03-24 DIAGNOSIS — M6281 Muscle weakness (generalized): Secondary | ICD-10-CM

## 2023-03-24 NOTE — Therapy (Addendum)
OUTPATIENT PHYSICAL THERAPY TREATMENT   Patient Name: Robin [name] MRN: [id number] DOB:1956-06-18, 67 y.o., female Today's Date: 03/24/2023  END OF SESSION:  PT End of Session - 03/24/23 1029     Visit Number 2    Date for PT Re-Evaluation 05/17/23    Authorization Type Traditional Medicaid    Authorization Time Period 03/24/23 - 04/13/23    Authorization - Visit Number 1    Authorization - Number of Visits 3    PT Start Time 1029   Pt arrived late   PT Stop Time 1109    PT Time Calculation (min) 40 min    Activity Tolerance Patient tolerated treatment well             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   Anemia    Anxiety disorder    Arthritis    Asthma    Back pain    Chronic neck pain    Colon polyps    Constipation    Depression    Diabetes mellitus without complication (HCC)    Dyspnea    Edema of lower extremity    Fibromyalgia    GERD (gastroesophageal reflux disease)    Heartburn    Hiatal hernia    small   Hyperlipidemia    Hypertension    Hypothyroidism    Joint pain    Morbid obesity with BMI of 50.0-59.9, adult (HCC)    Osteoarthritis    Other fatigue    Schatzki's ring    non critical   Severe episode of recurrent major depressive disorder, with psychotic features (HCC) 05/26/2018   Sinusitis    Sleep apnea    CPAP, Sleep study at [facility]   SOB (shortness of breath) on exertion    Status post dilation of esophageal narrowing    Swallowing difficulty    Past Surgical History:  Procedure Laterality Date   ABDOMINAL HYSTERECTOMY  02/18/2000   CARDIAC CATHETERIZATION  08/18/2003   normal L main/LAD/L Cfx/RCA (Dr. Erlene Quan)   COLONOSCOPY  2006   Dr. Franky Macho, hyperplastic polyps   COLONOSCOPY  08/06/2011   abnormal terminal ileum for 10cm, erosions, geographical ulceration. Bx small bowel mucosa with prominent intramucosal lymphoid  aggregates, slightly inflammed   COLONOSCOPY WITH PROPOFOL N/A 12/28/2019   Rourk: 4 sessile serrated adenomas removed on the colon.  Next colonoscopy in 3 years.   DIRECT LARYNGOSCOPY  03/15/2012   Procedure: DIRECT LARYNGOSCOPY;  Surgeon: Flo Shanks, MD;  Location: [facility] OR;  Service: ENT;  Laterality: N/A; Dr. Wolicki--:>no foreign body seen. normal esophagus to 40cm   ESOPHAGEAL DILATION  04/17/2015   Procedure: ESOPHAGEAL DILATION;  Surgeon: Corbin Ade, MD;  [facility];  Service: Endoscopy;;   ESOPHAGOGASTRODUODENOSCOPY  08/23/2008   HYQ:MVHQ distal esophageal erosions consistent with mild erosive reflux esophagitis, otherwise unremarkable esophagus/ Tiny antral erosions of doubtful clinical significance, otherwise normal stomach, patent pylorus, normal D1 and D2   ESOPHAGOGASTRODUODENOSCOPY  08/06/2011   small hh, noncritical Schatzki's ring s/p 58 F   ESOPHAGOGASTRODUODENOSCOPY N/A 04/17/2015   ION:GEXBMW s/p dilation   ESOPHAGOGASTRODUODENOSCOPY (EGD) WITH PROPOFOL N/A 01/20/2018   Dr. Jena Gauss: Erosive gastropathy, normal-appearing esophagus status post empiric dilation.  Chronic gastritis, no H. pylori.   ESOPHAGOSCOPY  03/15/2012   Procedure: ESOPHAGOSCOPY;  Surgeon: Flo Shanks, MD;  Location: [facility] OR;  Service: ENT;  Laterality: N/A;   KNEE ARTHROSCOPY  Left 04/27/2001   MALONEY DILATION N/A 01/20/2018   Procedure: Elease Hashimoto DILATION;  Surgeon: Corbin Ade, MD;  Location: [facility];  Service: Endoscopy;  Laterality: N/A;   NM MYOCAR PERF WALL MOTION  2003   negative bruce protocol exercise tress test; EF 68%; intermediate risk study due to evidence of anterior wall ischemia extending from mid-ventricle to apex   PITUITARY SURGERY  06/2012   benign tumor, surgeon at [facility]   POLYPECTOMY  12/28/2019   Procedure: POLYPECTOMY;  Surgeon: Corbin Ade, MD;  Location: [facility];  Service: Endoscopy;;   RECTOCELE REPAIR     TRANSTHORACIC ECHOCARDIOGRAM  2003    EF normal   VAGINAL PROLAPSE REPAIR     VIDEO BRONCHOSCOPY Bilateral 03/08/2017   Procedure: VIDEO BRONCHOSCOPY WITHOUT FLUORO;  Surgeon: Roslynn Amble, MD;  Location: [facility] ENDOSCOPY;  Service: Cardiopulmonary;  Laterality: Bilateral;   Patient Active Problem List   Diagnosis Date Noted   Type 2 diabetes mellitus with obesity (HCC) 02/04/2023   Moderate major depression (HCC) 11/12/2022   BMI 40.0-44.9, adult (HCC) 11/03/2022   B12 deficiency 10/06/2022   Depression 07/09/2022   Class 3 severe obesity with serious comorbidity and body mass index (BMI) of 45.0 to 49.9 in adult [facility]) 07/09/2022   Major depressive disorder, recurrent episode (HCC) 07/02/2022   Generalized anxiety disorder 07/02/2022   Body mass index 40.0-44.9, adult (HCC) 02/25/2022   Morbid obesity (HCC) 02/25/2022   Right knee pain 02/18/2022   Diabetes mellitus type 2 in obese 02/18/2022   Lumbar foraminal stenosis 01/23/2022   Lumbar radiculopathy 01/23/2022   Pain in both knees 01/23/2022   Vitamin D deficiency 11/18/2021   Polyp of descending colon 02/15/2020   Pituitary tumor 08/21/2019   Primary osteoarthritis of both hips 08/21/2019   Primary osteoarthritis of both knees 08/21/2019   Pain in left shoulder 08/21/2019   Morbid obesity with BMI of 60.0-69.9, adult (HCC) 07/17/2019   Pedal edema 07/17/2019   Multilevel degenerative disc disease 10/31/2018   Change in bowel function 10/28/2018   Gastritis and gastroduodenitis 04/26/2018   Atrophic vaginitis 04/01/2018   Cough, persistent 02/08/2018   Dysphagia 12/14/2017   Generalized OA 07/30/2017   Urinary incontinence 07/23/2017   Early satiety 06/16/2017   Hx of iron deficiency anemia 05/28/2017   Throat pain in adult 04/05/2017   Prediabetes 02/04/2017   Dyspnea on exertion 12/23/2016   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 12/23/2016   Hemoptysis 12/23/2016   Fibromyalgia 08/18/2016   Chronic  lymphocytic thyroiditis 08/18/2016   Persistent depressive disorder 04/01/2016   OSA (obstructive sleep apnea) 04/01/2016   Essential hypertension 12/09/2015   Hypothyroidism 12/09/2015   Morbid obesity due to excess calories (HCC) 12/09/2015   Constipation 05/27/2015   Fatty liver 05/27/2015   Abdominal pain, chronic, epigastric 07/04/2012   History of gastroesophageal reflux (GERD) 06/09/2012   Hyperlipidemia 06/09/2012   Refusal of blood transfusions as patient is Jehovah's Witness 06/09/2012   Anxiety 06/09/2012   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   Abnormal small bowel biopsy 09/18/2011   Lactose intolerance 09/18/2011   GERD 01/21/2010    PCP: Marcine Matar, MD   REFERRING PROVIDER: Marcine Matar, MD   REFERRING DIAG: R26.89 (ICD-10-CM) - Decreased mobility   THERAPY DIAG:  Other abnormalities of gait and mobility  Contracture of muscle, multiple sites  Muscle weakness (generalized)  Difficulty in walking, not elsewhere classified  RATIONALE FOR EVALUATION AND TREATMENT: Rehabilitation  ONSET  DATE: 10+ years  NEXT MD VISIT: 07/15/23   SUBJECTIVE:   SUBJECTIVE STATEMENT: Pt reports she has arthritis all over - neck, back, knees, hips, shoulders, elbows, etc. OA in knees is so bad that it prevents her from walking as she is unable to fully straighten her knees. She wants Dr. Roda Shutters to do TKR but unable to have TKR due limited ROM and being non-ambulatory. She does walk some in her home with a RW, and occasionally outdoors with her husband, but uses power wc majority of the time and when going out. She reports it has been 6-7 yrs since she has walked regularly. She sleeps curled up on her side in bed. She had been going to pain management but had to stop due to insurance changes, but is hoping to start back.  PAIN: Are you having pain? Yes: NPRS scale: 7/10 Pain location: B knees (L>R) Pain description: sharp, achy Aggravating factors: straightening  her knees, standing and walking Relieving factors: exercise, ice, heat, pain meds  Are you having pain? Yes: NPRS scale: 7/10 Pain location: B hips (R>L) Pain description: sharp, achy Aggravating factors: moving in the bed Relieving factors: exercise, ice, heat, pain meds  PERTINENT HISTORY: HTN, DM, OSA, fibromyalgia, depression, prediabetes, DOE, obesity, GERD, hypothyroidism, OA of multiple joints (neck, shoulders, elbows, hands, back, hips, knees), multilevel DDD, lumbar radiculopathy, OSA, pituitary adenoma status post resection   PRECAUTIONS: Fall  HAND DOMINANCE: Right  WEIGHT BEARING RESTRICTIONS: No  FALLS:  Has patient fallen in last 6 months? No  LIVING ENVIRONMENT: Lives with: lives with their spouse Lives in: House/apartment Stairs: Yes: External: 3 steps; ramp available Has following equipment at home: Counselling psychologist, Environmental consultant - 2 wheeled, Wheelchair (power), shower chair, bed side commode, Grab bars, and Ramped entry  OCCUPATION: Retired  PLOF: Independent with basic ADLs, Independent with household mobility with device, Requires assistive device for independence, Needs assistance with homemaking, and Leisure: sedentary  PATIENT GOALS: "I want to get mobile enough to have TKR surgery."   OBJECTIVE: (objective measures completed at initial evaluation unless otherwise dated)  DIAGNOSTIC FINDINGS:  02/25/22 - B knee x-rays:  Advanced tricompartmental degenerative joint disease. Bone-on-bone joint space narrowing. Varus deformity.  PATIENT SURVEYS:  ABC scale 260 / 1600 = 16.3 % LEFS 38 / 80 = 47.5 %  COGNITION: Overall cognitive status: Within functional limits for tasks assessed     SENSATION: WFL  MUSCLE LENGTH: Hamstrings: severely tight B ITB: NT Piriformis: mod/severe tight B Hip flexors: severely tight B Quads: mod/severe tight B Heelcord: NT  POSTURE:  flexed trunk  and severe crouch in standing d/t hip and knee flexion  contractures  LOWER EXTREMITY ROM:  Active & Passive ROM Right eval Left eval  Hip flexion      Hip extension -30 -30  Hip abduction      Hip adduction      Hip internal rotation      Hip external rotation      Knee flexion 120 112  Knee extension -31 -43  Ankle dorsiflexion      Ankle plantarflexion      Ankle inversion      Ankle eversion       (Blank rows = not tested)  LOWER EXTREMITY MMT:  MMT Right eval Left eval  Hip flexion 4+ 4+  Hip extension 4 for available ROM 4 for available ROM  Hip abduction 4- 4-  Hip adduction 4 4  Hip internal rotation 4+ 4+  Hip external rotation 4 4-  Knee flexion 4+ 4+  Knee extension 4+ for available ROM 4+ for available ROM  Ankle dorsiflexion 4+ 4+  Ankle plantarflexion    Ankle inversion    Ankle eversion     (Blank rows = not tested)  FUNCTIONAL TESTS: (03/24/23) 5 times sit to stand: 21.78 sec w/o UE assist (unable to achieve full upright posture due to hip and knee flexion contractures) Timed up and go (TUG): 47.71 sec with RW 10 meter walk test: 30.31 sec with RW; Gait speed = 1.08 ft/sec with RW  BED MOBILITY:  Sit to supine SBA Supine to sit Mod A Rolling to Right Min A Rolling to Left Min A  TRANSFERS: Assistive device utilized: Environmental consultant - 2 wheeled  Sit to stand: SBA Stand to sit: SBA Chair to chair: SBA Floor:  NT  GAIT: Distance walked: 3-4 steps from power wc to treatment table Assistive device utilized: Environmental consultant - 2 wheeled Level of assistance: CGA Gait pattern: step to pattern, decreased stride length, knee flexed in stance- Right, knee flexed in stance- Left, shuffling, antalgic, and trunk flexed Comments: severe crouch in standing/with gait d/t B hip and knee flexion contractures  RAMP: Level of Assistance:  NT Assistive device utilized:  NT Ramp Comments:   CURB:  Level of Assistance:  NT Assistive device utilized:  NT Curb Comments:   STAIRS:  Level of Assistance:  NT  Stair Negotiation  Technique:   Number of Stairs:    Height of Stairs:   Comments:    TODAY'S TREATMENT:  03/24/23 THERAPEUTIC ACTIVITIES: 5xSTS = 21.78 sec w/o UE assist (unable to achieve full upright posture due to hip and knee flexion contractures) TUG = 47.71 sec with RW = 30.31 sec with RW Gait speed = 1.08 ft/sec with RW  THERAPEUTIC EXERCISE: to improve flexibility, strength and mobility.  Demonstration, verbal and tactile cues throughout for technique. Seated hip hinge HS stretch x 30" bil Seated hip hinge HS stretch + strap for gastroc stretch x 30" bil - pt reporting too intense Seated hip hinge HS stretch with foot on 9" stool x 30" bil (HEP) Seated gastroc stretch with strap and foot elevated on 9" stool x 30" bil (HEP) L Mod thomas hip flexor stretch x 30" - pt reporting too intense Supine SKTC with opp LE straight for hip flexor stretch x 30" bil (HEP)   03/22/23 Initial eval Educated pt on positional stretch laying supine in bed to promote B hip and knee extension stretch   PATIENT EDUCATION:  Education details: initial HEP and positioning in bed to promote hip and knee extension Person educated: Patient Education method: Programmer, multimedia, Demonstration, Tactile cues, Verbal cues, and Handouts Education comprehension: verbalized understanding, returned demonstration, verbal cues required, tactile cues required, and needs further education  HOME EXERCISE PROGRAM: Access Code: [facility] URL: [email or web address] Date: 03/24/2023 Prepared by: Glenetta Hew  Exercises - Seated Hamstring Stretch  - 2-3 x daily - 7 x weekly - 3 reps - 30 sec hold - Seated Gastroc Stretch with Strap  - 2-3 x daily - 7 x weekly - 3 reps - 30 sec hold - Single Knee to Chest Stretch  - 2-3 x daily - 7 x weekly - 3 reps - 30 sec hold   ASSESSMENT:  CLINICAL IMPRESSION: Balance testing completed revealing significantly diminished gait speed at only a household ambulator functional  level, as well as a high risk for recurrent falls.  Remainder of session focusing on  initiation of stretching HEP exercises to reduce hip and knee flexion contractures - multiple versions of stretches attempted to find most efficient and comfortable stretches for HEP.  Bristal was able to perform good return demonstration of all exercises included in HEP, however will benefit from further review next visit.  OBJECTIVE IMPAIRMENTS: Abnormal gait, decreased activity tolerance, decreased balance, decreased endurance, decreased knowledge of condition, decreased knowledge of use of DME, decreased mobility, difficulty walking, decreased ROM, decreased strength, decreased safety awareness, hypomobility, increased fascial restrictions, impaired perceived functional ability, increased muscle spasms, impaired flexibility, improper body mechanics, postural dysfunction, and pain.   ACTIVITY LIMITATIONS: carrying, lifting, standing, squatting, sleeping, stairs, transfers, bed mobility, locomotion level, and caring for others  PARTICIPATION LIMITATIONS: meal prep, cleaning, laundry, driving, shopping, community activity, and yard work  PERSONAL FACTORS: Education, Financial risk analyst, Past/current experiences, Time since onset of injury/illness/exacerbation, and 3+ comorbidities: HTN, DM, OSA, fibromyalgia, depression, prediabetes, DOE, obesity, GERD, hypothyroidism, OA of multiple joints (neck, shoulders, elbows, hands, back, hips, knees), multilevel DDD, lumbar radiculopathy, OSA, pituitary adenoma status post resection  are also affecting patient's functional outcome.   REHAB POTENTIAL: Fair due to severity of contractures and prolonged limited mobility over several years  CLINICAL DECISION MAKING: Evolving/moderate complexity  EVALUATION COMPLEXITY: Moderate   GOALS: Goals reviewed with patient? Yes  SHORT TERM GOALS: Target date: 04/19/2023   Patient will be independent with initial HEP. Baseline: Formal HEP to be  established next visit.  Education provided on positional stretching for hip and knee extension in bed on eval. Goal status: IN PROGRESS  2.  Patient will demonstrate >/= 5 degree reduction in B hip and knee flexion contractures. Baseline: Refer to above LE ROM table. Goal status: IN PROGRESS  3.  Patient will be able to ambulate 30+ ft with RW and SBA of PT, demonstrating improved upright posture for more normal gait pattern to increase functional ambulation in home.  Baseline: 3-4 steps with RW in severe crouch with CGA of PT Goal status: IN PROGRESS  LONG TERM GOALS: Target date: 05/17/2023   Patient will be independent with advanced/ongoing HEP to improve outcomes and carryover.  Baseline:  Goal status: IN PROGRESS  2.  Patient will report at least >/= 50% improvement in B hip and knee pain to improve QOL. Baseline: 7/10 Goal status: IN PROGRESS  3.  Patient will demonstrate improved B hip and knee AROM to [facility] to allow for normal stance and gait mechanics. Baseline: Refer to above LE ROM table. Goal status: IN PROGRESS  4.  Patient will demonstrate improved B LE strength to >/= 4+/5 for improved stability and ease of mobility. Baseline: Refer to above LE MMT table Goal status: IN PROGRESS  5.  Patient will be able to ambulate 50-75 ft with RW or LRAD and improved upright posture for more normal gait pattern to increase functional ambulation in home.  Baseline: 3-4 steps with RW in severe crouch due to B hip and knee flexion contracture Goal status: IN PROGRESS  6.  Patient will report >/= 47/80 on LEFS to demonstrate improved functional ability. Baseline: 38 / 80 = 47.5 % Goal status: IN PROGRESS  7.  Patient will report >/= 35% on ABC scale to demonstrate improved functional ability and activity confidence with decreased fear of falling. Baseline: 260 / 1600 = 16.3 % Goal status: IN PROGRESS  8.  Patient will demonstrate decreased TUG time by >/= 10 sec to decrease risk  for falls with transitional mobility. Baseline: 47.71 sec  with RW (03/24/23) Goal status: IN PROGRESS    PLAN:  PT FREQUENCY: 1-2x/week  PT DURATION: 8 weeks  PLANNED INTERVENTIONS: Therapeutic exercises, Therapeutic activity, Neuromuscular re-education, Balance training, Gait training, Patient/Family education, Self Care, Joint mobilization, Stair training, Aquatic Therapy, Dry Needling, Electrical stimulation, Cryotherapy, Moist heat, Taping, Vasopneumatic device, Ultrasound, Ionotophoresis 4mg /ml Dexamethasone, Manual therapy, and Re-evaluation  PLAN FOR NEXT SESSION: Review/update HEP for LE stretching and progress to include LE strengthening; MT +/- DN to help reduce muscle tension and improve ROM/reduce muscle contractures   Marry Guan, PT 03/24/2023, 11:10 AM

## 2023-03-24 NOTE — Telephone Encounter (Signed)
I spoke to [facility] Delivered documentation department : [phone] and she said that they faxed a CMN and physician order for provider to sign for incontinence products.  The CMN needs to have the following questions answered: 21,22,30 and there needs to be a date of onset with diagnosis

## 2023-03-25 ENCOUNTER — Telehealth: Payer: Self-pay | Admitting: Internal Medicine

## 2023-03-25 DIAGNOSIS — E1169 Type 2 diabetes mellitus with other specified complication: Secondary | ICD-10-CM

## 2023-03-25 MED ORDER — ACCU-CHEK GUIDE VI STRP
ORAL_STRIP | 2 refills | Status: DC
Start: 2023-03-25 — End: 2023-09-08

## 2023-03-25 MED ORDER — ACCU-CHEK GUIDE W/DEVICE KIT
PACK | 0 refills | Status: DC
Start: 2023-03-25 — End: 2023-06-08

## 2023-03-25 MED ORDER — ACCU-CHEK SOFTCLIX LANCETS MISC
2 refills | Status: DC
Start: 2023-03-25 — End: 2023-09-08

## 2023-03-25 NOTE — Telephone Encounter (Signed)
Rxn sent for Accu Chek guide supplies.

## 2023-03-25 NOTE — Telephone Encounter (Signed)
Robin [name] with [facility] states the patients insurance will no longer cover her ACCU-CHEK AVIVA PLUS . They will however cover the Accu-chek guide. Please send a new prescription in for this. Assist patient further. She will need the full kit including meter, strips and lancets sent in.

## 2023-03-26 NOTE — Telephone Encounter (Signed)
Called & spoke to [employer] . Informed that forms have been received and currently awaiting for signature upon providers return. Tangela expressed understanding.

## 2023-03-29 ENCOUNTER — Ambulatory Visit: Payer: Medicaid Other

## 2023-03-29 DIAGNOSIS — R262 Difficulty in walking, not elsewhere classified: Secondary | ICD-10-CM

## 2023-03-29 DIAGNOSIS — M6281 Muscle weakness (generalized): Secondary | ICD-10-CM

## 2023-03-29 DIAGNOSIS — R2689 Other abnormalities of gait and mobility: Secondary | ICD-10-CM | POA: Diagnosis not present

## 2023-03-29 DIAGNOSIS — M6249 Contracture of muscle, multiple sites: Secondary | ICD-10-CM

## 2023-03-29 NOTE — Therapy (Signed)
OUTPATIENT PHYSICAL THERAPY TREATMENT   Patient Name: Robin [name] MRN: [id number] DOB:[birth year], 67 y.o., female Today's Date: 03/29/2023  END OF SESSION:  PT End of Session - 03/29/23 1019     Visit Number 3    Date for PT Re-Evaluation 05/17/23    Authorization Type Traditional Medicaid    Authorization Time Period 03/24/23 - 04/13/23    Authorization - Visit Number 2    Authorization - Number of Visits 3    PT Start Time 0934    PT Stop Time 1018    PT Time Calculation (min) 44 min    Activity Tolerance Patient tolerated treatment well    Behavior During Therapy [facility] for tasks assessed/performed             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   Anemia    Anxiety disorder    Arthritis    Asthma    Back pain    Chronic neck pain    Colon polyps    Constipation    Depression    Diabetes mellitus without complication (HCC)    Dyspnea    Edema of lower extremity    Fibromyalgia    GERD (gastroesophageal reflux disease)    Heartburn    Hiatal hernia    small   Hyperlipidemia    Hypertension    Hypothyroidism    Joint pain    Morbid obesity with BMI of 50.0-59.9, adult (HCC)    Osteoarthritis    Other fatigue    Schatzki's ring    non critical   Severe episode of recurrent major depressive disorder, with psychotic features (HCC) 05/26/2018   Sinusitis    Sleep apnea    CPAP, Sleep study at [facility]   SOB (shortness of breath) on exertion    Status post dilation of esophageal narrowing    Swallowing difficulty    Past Surgical History:  Procedure Laterality Date   ABDOMINAL HYSTERECTOMY  02/18/2000   CARDIAC CATHETERIZATION  08/18/2003   normal L main/LAD/L Cfx/RCA (Dr. Erlene Quan)   COLONOSCOPY  2006   Dr. Franky Macho, hyperplastic polyps   COLONOSCOPY  08/06/2011   abnormal terminal ileum for 10cm, erosions, geographical ulceration. Bx small bowel  mucosa with prominent intramucosal lymphoid aggregates, slightly inflammed   COLONOSCOPY WITH PROPOFOL N/A 12/28/2019   Rourk: 4 sessile serrated adenomas removed on the colon.  Next colonoscopy in 3 years.   DIRECT LARYNGOSCOPY  03/15/2012   Procedure: DIRECT LARYNGOSCOPY;  Surgeon: Flo Shanks, MD;  Location: [facility] OR;  Service: ENT;  Laterality: N/A; Dr. Wolicki--:>no foreign body seen. normal esophagus to 40cm   ESOPHAGEAL DILATION  04/17/2015   Procedure: ESOPHAGEAL DILATION;  Surgeon: Corbin Ade, MD;  Location: [facility];  Service: Endoscopy;;   ESOPHAGOGASTRODUODENOSCOPY  08/23/2008   ZYS:AYTK distal esophageal erosions consistent with mild erosive reflux esophagitis, otherwise unremarkable esophagus/ Tiny antral erosions of doubtful clinical significance, otherwise normal stomach, patent pylorus, normal D1 and D2   ESOPHAGOGASTRODUODENOSCOPY  08/06/2011   small hh, noncritical Schatzki's ring s/p 58 F   ESOPHAGOGASTRODUODENOSCOPY N/A 04/17/2015   ZSW:FUXNAT s/p dilation   ESOPHAGOGASTRODUODENOSCOPY (EGD) WITH PROPOFOL N/A 01/20/2018   Dr. Jena Gauss: Erosive gastropathy, normal-appearing esophagus status post empiric dilation.  Chronic gastritis, no H. pylori.   ESOPHAGOSCOPY  03/15/2012   Procedure: ESOPHAGOSCOPY;  Surgeon: Flo Shanks, MD;  Location: [facility] OR;  Service: ENT;  Laterality: N/A;   KNEE ARTHROSCOPY Left 04/27/2001   MALONEY DILATION N/A 01/20/2018   Procedure: Elease Hashimoto DILATION;  Surgeon: Corbin Ade, MD;  Location: [facility];  Service: Endoscopy;  Laterality: N/A;   NM MYOCAR PERF WALL MOTION  2003   negative bruce protocol exercise tress test; EF 68%; intermediate risk study due to evidence of anterior wall ischemia extending from mid-ventricle to apex   PITUITARY SURGERY  06/2012   benign tumor, surgeon at [facility]   POLYPECTOMY  12/28/2019   Procedure: POLYPECTOMY;  Surgeon: Corbin Ade, MD;  Location: [facility];  Service: Endoscopy;;   RECTOCELE  REPAIR     TRANSTHORACIC ECHOCARDIOGRAM  2003   EF normal   VAGINAL PROLAPSE REPAIR     VIDEO BRONCHOSCOPY Bilateral 03/08/2017   Procedure: VIDEO BRONCHOSCOPY WITHOUT FLUORO;  Surgeon: Roslynn Amble, MD;  Location: [facility] ENDOSCOPY;  Service: Cardiopulmonary;  Laterality: Bilateral;   Patient Active Problem List   Diagnosis Date Noted   Type 2 diabetes mellitus with obesity (HCC) 02/04/2023   Moderate major depression (HCC) 11/12/2022   BMI 40.0-44.9, adult (HCC) 11/03/2022   B12 deficiency 10/06/2022   Depression 07/09/2022   Class 3 severe obesity with serious comorbidity and body mass index (BMI) of 45.0 to 49.9 in adult (HCC) 07/09/2022   Major depressive disorder, recurrent episode (HCC) 07/02/2022   Generalized anxiety disorder 07/02/2022   Body mass index 40.0-44.9, adult (HCC) 02/25/2022   Morbid obesity (HCC) 02/25/2022   Right knee pain 02/18/2022   Diabetes mellitus type 2 in obese 02/18/2022   Lumbar foraminal stenosis 01/23/2022   Lumbar radiculopathy 01/23/2022   Pain in both knees 01/23/2022   Vitamin D deficiency 11/18/2021   Polyp of descending colon 02/15/2020   Pituitary tumor 08/21/2019   Primary osteoarthritis of both hips 08/21/2019   Primary osteoarthritis of both knees 08/21/2019   Pain in left shoulder 08/21/2019   Morbid obesity with BMI of 60.0-69.9, adult (HCC) 07/17/2019   Pedal edema 07/17/2019   Multilevel degenerative disc disease 10/31/2018   Change in bowel function 10/28/2018   Gastritis and gastroduodenitis 04/26/2018   Atrophic vaginitis 04/01/2018   Cough, persistent 02/08/2018   Dysphagia 12/14/2017   Generalized OA 07/30/2017   Urinary incontinence 07/23/2017   Early satiety 06/16/2017   Hx of iron deficiency anemia 05/28/2017   Throat pain in adult 04/05/2017   Prediabetes 02/04/2017   Dyspnea on exertion 12/23/2016   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 12/23/2016   Hemoptysis  12/23/2016   Fibromyalgia 08/18/2016   Chronic lymphocytic thyroiditis 08/18/2016   Persistent depressive disorder 04/01/2016   OSA (obstructive sleep apnea) 04/01/2016   Essential hypertension 12/09/2015   Hypothyroidism 12/09/2015   Morbid obesity due to excess calories (HCC) 12/09/2015   Constipation 05/27/2015   Fatty liver 05/27/2015   Abdominal pain, chronic, epigastric 07/04/2012   History of gastroesophageal reflux (GERD) 06/09/2012   Hyperlipidemia 06/09/2012   Refusal of blood transfusions as patient is Jehovah's Witness 06/09/2012   Anxiety 06/09/2012   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  PAIN: Are you having pain? Yes: NPRS scale: 6/10 Pain location: B knees (L>R) Pain description: sharp, achy Aggravating factors: straightening her knees, standing and walking Relieving factors: exercise, ice, heat, pain meds  Are you having pain? Yes: NPRS scale: 7/10 Pain location: B hips (R>L) Pain description: sharp, achy Aggravating factors: moving in the bed Relieving factors: exercise, ice, heat, pain meds  Bil shoulders 5/10 pain  PERTINENT HISTORY: HTN, DM, OSA, fibromyalgia, depression, prediabetes, DOE, obesity, GERD, hypothyroidism, OA of multiple joints (neck, shoulders, elbows, hands, back, hips, knees), multilevel DDD, lumbar radiculopathy, OSA,  pituitary adenoma status post resection   PRECAUTIONS: Fall  HAND DOMINANCE: Right  WEIGHT BEARING RESTRICTIONS: No  FALLS:  Has patient fallen in last 6 months? No  LIVING ENVIRONMENT: Lives with: lives with their spouse Lives in: House/apartment Stairs: Yes: External: 3 steps; ramp available Has following equipment at home: Counselling psychologist, Environmental consultant - 2 wheeled, Wheelchair (power), shower chair, bed side commode, Grab bars, and Ramped entry  OCCUPATION: Retired  PLOF: Independent with basic ADLs, Independent with household mobility with device, Requires assistive device for independence, Needs assistance with homemaking, and Leisure: sedentary  PATIENT GOALS: "I want to get mobile enough to have TKR surgery."   OBJECTIVE: (objective measures completed at initial evaluation unless otherwise dated)  DIAGNOSTIC FINDINGS:  02/25/22 - B knee x-rays:  Advanced tricompartmental degenerative joint disease. Bone-on-bone joint space narrowing. Varus deformity.  PATIENT SURVEYS:  ABC scale 260 / 1600 = 16.3 % LEFS 38 / 80 = 47.5 %  COGNITION: Overall cognitive status: Within functional limits for tasks assessed     SENSATION: WFL  MUSCLE LENGTH: Hamstrings: severely tight B ITB: NT Piriformis: mod/severe tight B Hip flexors: severely tight B Quads: mod/severe tight B Heelcord: NT  POSTURE:  flexed trunk  and severe crouch in standing d/t hip and knee flexion contractures  LOWER EXTREMITY ROM:  Active & Passive ROM Right eval Left eval  Hip flexion -30 -30  Hip extension    Hip abduction    Hip adduction    Hip internal rotation    Hip external rotation    Knee flexion -31 -43  Knee extension 120 112  Ankle dorsiflexion    Ankle plantarflexion    Ankle inversion    Ankle eversion     (Blank rows = not tested)  LOWER EXTREMITY MMT:  MMT Right eval Left eval  Hip flexion 4+ 4+  Hip extension 4 for available ROM 4 for available ROM  Hip abduction 4- 4-   Hip adduction 4 4  Hip internal rotation 4+ 4+  Hip external rotation 4 4-  Knee flexion 4+ 4+  Knee extension 4+ for available ROM 4+ for available ROM  Ankle dorsiflexion 4+ 4+  Ankle plantarflexion    Ankle inversion    Ankle eversion     (Blank rows = not tested)  FUNCTIONAL TESTS: (03/24/23) 5 times sit to stand: 21.78 sec w/o UE assist (unable to achieve full upright posture due to hip and knee flexion contractures) Timed up and go (TUG): 47.71 sec with RW 10 meter walk test: 30.31 sec with RW; Gait speed = 1.08 ft/sec with RW  BED MOBILITY:  Sit to supine SBA Supine to sit Mod A Rolling to Right Min A Rolling to  Left Min A  TRANSFERS: Assistive device utilized: Environmental consultant - 2 wheeled  Sit to stand: SBA Stand to sit: SBA Chair to chair: SBA Floor:  NT  GAIT: Distance walked: 3-4 steps from power wc to treatment table Assistive device utilized: Walker - 2 wheeled Level of assistance: CGA Gait pattern: step to pattern, decreased stride length, knee flexed in stance- Right, knee flexed in stance- Left, shuffling, antalgic, and trunk flexed Comments: severe crouch in standing/with gait d/t B hip and knee flexion contractures  RAMP: Level of Assistance:  NT Assistive device utilized:  NT Ramp Comments:   CURB:  Level of Assistance:  NT Assistive device utilized:  NT Curb Comments:   STAIRS:  Level of Assistance:  NT  Stair Negotiation Technique:   Number of Stairs:    Height of Stairs:   Comments:    TODAY'S TREATMENT: 03/29/23 THERAPEUTIC EXERCISE: to improve flexibility, strength and mobility.  Demonstration, verbal and tactile cues throughout for technique. Seated hamstring stretch hip hinge x 30 sec B Seated hamstring stretch with foot propped up in chair x 30 sec B Seated gastroc stretch with gait belt x 30 sec Seated LAQ x 10 R/L Seated heel/toe raises x 10  Gait 50 ft with RW CGA with gait belt Nustep L3x27min  03/24/23 THERAPEUTIC  ACTIVITIES: 5xSTS = 21.78 sec w/o UE assist (unable to achieve full upright posture due to hip and knee flexion contractures) TUG = 47.71 sec with RW = 30.31 sec with RW Gait speed = 1.08 ft/sec with RW  THERAPEUTIC EXERCISE: to improve flexibility, strength and mobility.  Demonstration, verbal and tactile cues throughout for technique. Seated hip hinge HS stretch x 30" bil Seated hip hinge HS stretch + strap for gastroc stretch x 30" bil - pt reporting too intense Seated hip hinge HS stretch with foot on 9" stool x 30" bil (HEP) Seated gastroc stretch with strap and foot elevated on 9" stool x 30" bil (HEP) L Mod thomas hip flexor stretch x 30" - pt reporting too intense Supine SKTC with opp LE straight for hip flexor stretch x 30" bil (HEP)   03/22/23 Initial eval Educated pt on positional stretch laying supine in bed to promote B hip and knee extension stretch   PATIENT EDUCATION:  Education details: initial HEP and positioning in bed to promote hip and knee extension Person educated: Patient Education method: Programmer, multimedia, Demonstration, Tactile cues, Verbal cues, and Handouts Education comprehension: verbalized understanding, returned demonstration, verbal cues required, tactile cues required, and needs further education  HOME EXERCISE PROGRAM: Access Code: [facility] URL: [email or web address] Date: 03/24/2023 Prepared by: Glenetta Hew  Exercises - Seated Hamstring Stretch  - 2-3 x daily - 7 x weekly - 3 reps - 30 sec hold - Seated Gastroc Stretch with Strap  - 2-3 x daily - 7 x weekly - 3 reps - 30 sec hold - Single Knee to Chest Stretch  - 2-3 x daily - 7 x weekly - 3 reps - 30 sec hold   ASSESSMENT:  CLINICAL IMPRESSION: Pt arrives with very tight hips and knees. B knee flexion contractures and mild pain with stretching her hamstrings. Weakness noted with LAQ. She was able to walk for 50 ft with a walker but posturally limited d/t contractures. She  would continue to benefit from skilled therapy to address current deficits.   OBJECTIVE IMPAIRMENTS: Abnormal gait, decreased activity tolerance, decreased balance, decreased endurance, decreased knowledge of condition, decreased knowledge of use of DME, decreased mobility, difficulty walking,  decreased ROM, decreased strength, decreased safety awareness, hypomobility, increased fascial restrictions, impaired perceived functional ability, increased muscle spasms, impaired flexibility, improper body mechanics, postural dysfunction, and pain.   ACTIVITY LIMITATIONS: carrying, lifting, standing, squatting, sleeping, stairs, transfers, bed mobility, locomotion level, and caring for others  PARTICIPATION LIMITATIONS: meal prep, cleaning, laundry, driving, shopping, community activity, and yard work  PERSONAL FACTORS: Education, Financial risk analyst, Past/current experiences, Time since onset of injury/illness/exacerbation, and 3+ comorbidities: HTN, DM, OSA, fibromyalgia, depression, prediabetes, DOE, obesity, GERD, hypothyroidism, OA of multiple joints (neck, shoulders, elbows, hands, back, hips, knees), multilevel DDD, lumbar radiculopathy, OSA, pituitary adenoma status post resection  are also affecting patient's functional outcome.   REHAB POTENTIAL: Fair due to severity of contractures and prolonged limited mobility over several years  CLINICAL DECISION MAKING: Evolving/moderate complexity  EVALUATION COMPLEXITY: Moderate   GOALS: Goals reviewed with patient? Yes  SHORT TERM GOALS: Target date: 04/19/2023   Patient will be independent with initial HEP. Baseline: Formal HEP to be established next visit.  Education provided on positional stretching for hip and knee extension in bed on eval. Goal status: IN PROGRESS  2.  Patient will demonstrate >/= 5 degree reduction in B hip and knee flexion contractures. Baseline: Refer to above LE ROM table. Goal status: IN PROGRESS  3.  Patient will be able to  ambulate 30+ ft with RW and SBA of PT, demonstrating improved upright posture for more normal gait pattern to increase functional ambulation in home.  Baseline: 3-4 steps with RW in severe crouch with CGA of PT Goal status: IN PROGRESS  LONG TERM GOALS: Target date: 05/17/2023   Patient will be independent with advanced/ongoing HEP to improve outcomes and carryover.  Baseline:  Goal status: IN PROGRESS  2.  Patient will report at least >/= 50% improvement in B hip and knee pain to improve QOL. Baseline: 7/10 Goal status: IN PROGRESS  3.  Patient will demonstrate improved B hip and knee AROM to [facility] to allow for normal stance and gait mechanics. Baseline: Refer to above LE ROM table. Goal status: IN PROGRESS  4.  Patient will demonstrate improved B LE strength to >/= 4+/5 for improved stability and ease of mobility. Baseline: Refer to above LE MMT table Goal status: IN PROGRESS  5.  Patient will be able to ambulate 50-75 ft with RW or LRAD and improved upright posture for more normal gait pattern to increase functional ambulation in home.  Baseline: 3-4 steps with RW in severe crouch due to B hip and knee flexion contracture Goal status: IN PROGRESS  6.  Patient will report >/= 47/80 on LEFS to demonstrate improved functional ability. Baseline: 38 / 80 = 47.5 % Goal status: IN PROGRESS  7.  Patient will report >/= 35% on ABC scale to demonstrate improved functional ability and activity confidence with decreased fear of falling. Baseline: 260 / 1600 = 16.3 % Goal status: IN PROGRESS  8.  Patient will demonstrate decreased TUG time by >/= 10 sec to decrease risk for falls with transitional mobility. Baseline: 47.71 sec with RW (03/24/23) Goal status: IN PROGRESS    PLAN:  PT FREQUENCY: 1-2x/week  PT DURATION: 8 weeks  PLANNED INTERVENTIONS: Therapeutic exercises, Therapeutic activity, Neuromuscular re-education, Balance training, Gait training, Patient/Family education, Self  Care, Joint mobilization, Stair training, Aquatic Therapy, Dry Needling, Electrical stimulation, Cryotherapy, Moist heat, Taping, Vasopneumatic device, Ultrasound, Ionotophoresis 4mg /ml Dexamethasone, Manual therapy, and Re-evaluation  PLAN FOR NEXT SESSION: Review/update HEP for LE stretching and progress to include LE  strengthening; MT +/- DN to help reduce muscle tension and improve ROM/reduce muscle contractures   Darleene Cleaver, PTA 03/29/2023, 12:02 PM

## 2023-03-29 NOTE — Telephone Encounter (Signed)
Forms were successfully faxed on 03/24/2023 to [facility].

## 2023-03-30 ENCOUNTER — Telehealth: Payer: Self-pay | Admitting: Internal Medicine

## 2023-03-30 NOTE — Telephone Encounter (Signed)
Copied from CRM [id number]. Topic: General - Other >> Mar 30, 2023  9:04 AM Everette C wrote: Reason for CRM: Stephanie with [facility] has called to follow up on previously discussed documents related to the patient's orders. Please  provide Date of onset on CFRA in MM/DD/YYYY format on previously received paperwork and resubmit when possible  Please contact further if needed

## 2023-03-30 NOTE — Therapy (Addendum)
OUTPATIENT PHYSICAL THERAPY TREATMENT   Patient Name: Robin [name] MRN: [id number] DOB:[birth year], 67 y.o., female Today's Date: 03/31/2023  END OF SESSION:  PT End of Session - 03/31/23 0938     Visit Number 4    Date for PT Re-Evaluation 05/17/23    Authorization Type Traditional Medicaid    Authorization Time Period 03/24/23 - 04/13/23    Authorization - Visit Number 3    Authorization - Number of Visits 3    PT Start Time 0935    PT Stop Time 1015    PT Time Calculation (min) 40 min    Activity Tolerance Patient tolerated treatment well    Behavior During Therapy [facility] for tasks assessed/performed             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   Anemia    Anxiety disorder    Arthritis    Asthma    Back pain    Chronic neck pain    Colon polyps    Constipation    Depression    Diabetes mellitus without complication (HCC)    Dyspnea    Edema of lower extremity    Fibromyalgia    GERD (gastroesophageal reflux disease)    Heartburn    Hiatal hernia    small   Hyperlipidemia    Hypertension    Hypothyroidism    Joint pain    Morbid obesity with BMI of 50.0-59.9, adult (HCC)    Osteoarthritis    Other fatigue    Schatzki's ring    non critical   Severe episode of recurrent major depressive disorder, with psychotic features (HCC) 05/26/2018   Sinusitis    Sleep apnea    CPAP, Sleep study at [facility]   SOB (shortness of breath) on exertion    Status post dilation of esophageal narrowing    Swallowing difficulty    Past Surgical History:  Procedure Laterality Date   ABDOMINAL HYSTERECTOMY  02/18/2000   CARDIAC CATHETERIZATION  08/18/2003   normal L main/LAD/L Cfx/RCA (Dr. Erlene Quan)   COLONOSCOPY  2006   Dr. Franky Macho, hyperplastic polyps   COLONOSCOPY  08/06/2011   abnormal terminal ileum for 10cm, erosions, geographical ulceration. Bx small bowel  mucosa with prominent intramucosal lymphoid aggregates, slightly inflammed   COLONOSCOPY WITH PROPOFOL N/A 12/28/2019   Rourk: 4 sessile serrated adenomas removed on the colon.  Next colonoscopy in 3 years.   DIRECT LARYNGOSCOPY  03/15/2012   Procedure: DIRECT LARYNGOSCOPY;  Surgeon: Flo Shanks, MD;  Location: [facility] OR;  Service: ENT;  Laterality: N/A; Dr. Wolicki--:>no foreign body seen. normal esophagus to 40cm   ESOPHAGEAL DILATION  04/17/2015   Procedure: ESOPHAGEAL DILATION;  Surgeon: Corbin Ade, MD;  [facility];  Service: Endoscopy;;   ESOPHAGOGASTRODUODENOSCOPY  08/23/2008   GEX:BMWU distal esophageal erosions consistent with mild erosive reflux esophagitis, otherwise unremarkable esophagus/ Tiny antral erosions of doubtful clinical significance, otherwise normal stomach, patent pylorus, normal D1 and D2   ESOPHAGOGASTRODUODENOSCOPY  08/06/2011   small hh, noncritical Schatzki's ring s/p 58 F   ESOPHAGOGASTRODUODENOSCOPY N/A 04/17/2015   XLK:GMWNUU s/p dilation   ESOPHAGOGASTRODUODENOSCOPY (EGD) WITH PROPOFOL N/A 01/20/2018   Dr. Jena Gauss: Erosive gastropathy, normal-appearing esophagus status post empiric dilation.  Chronic gastritis, no H. pylori.   ESOPHAGOSCOPY  03/15/2012   Procedure: ESOPHAGOSCOPY;  Surgeon: Flo Shanks, MD;  Location: [facility] OR;  Service: ENT;  Laterality: N/A;   KNEE ARTHROSCOPY Left 04/27/2001   MALONEY DILATION N/A 01/20/2018   Procedure: Elease Hashimoto DILATION;  Surgeon: Corbin Ade, MD;  Location: [facility];  Service: Endoscopy;  Laterality: N/A;   NM MYOCAR PERF WALL MOTION  2003   negative bruce protocol exercise tress test; EF 68%; intermediate risk study due to evidence of anterior wall ischemia extending from mid-ventricle to apex   PITUITARY SURGERY  06/2012   benign tumor, surgeon at [facility]   POLYPECTOMY  12/28/2019   Procedure: POLYPECTOMY;  Surgeon: Corbin Ade, MD;  Location: [facility];  Service: Endoscopy;;   RECTOCELE  REPAIR     TRANSTHORACIC ECHOCARDIOGRAM  2003   EF normal   VAGINAL PROLAPSE REPAIR     VIDEO BRONCHOSCOPY Bilateral 03/08/2017   Procedure: VIDEO BRONCHOSCOPY WITHOUT FLUORO;  Surgeon: Roslynn Amble, MD;  Location: [facility] ENDOSCOPY;  Service: Cardiopulmonary;  Laterality: Bilateral;   Patient Active Problem List   Diagnosis Date Noted   Type 2 diabetes mellitus with obesity (HCC) 02/04/2023   Moderate major depression (HCC) 11/12/2022   BMI 40.0-44.9, adult (HCC) 11/03/2022   B12 deficiency 10/06/2022   Depression 07/09/2022   Class 3 severe obesity with serious comorbidity and body mass index (BMI) of 45.0 to 49.9 in adult (HCC) 07/09/2022   Major depressive disorder, recurrent episode (HCC) 07/02/2022   Generalized anxiety disorder 07/02/2022   Body mass index 40.0-44.9, adult (HCC) 02/25/2022   Morbid obesity (HCC) 02/25/2022   Right knee pain 02/18/2022   Diabetes mellitus type 2 in obese 02/18/2022   Lumbar foraminal stenosis 01/23/2022   Lumbar radiculopathy 01/23/2022   Pain in both knees 01/23/2022   Vitamin D deficiency 11/18/2021   Polyp of descending colon 02/15/2020   Pituitary tumor 08/21/2019   Primary osteoarthritis of both hips 08/21/2019   Primary osteoarthritis of both knees 08/21/2019   Pain in left shoulder 08/21/2019   Morbid obesity with BMI of 60.0-69.9, adult (HCC) 07/17/2019   Pedal edema 07/17/2019   Multilevel degenerative disc disease 10/31/2018   Change in bowel function 10/28/2018   Gastritis and gastroduodenitis 04/26/2018   Atrophic vaginitis 04/01/2018   Cough, persistent 02/08/2018   Dysphagia 12/14/2017   Generalized OA 07/30/2017   Urinary incontinence 07/23/2017   Early satiety 06/16/2017   Hx of iron deficiency anemia 05/28/2017   Throat pain in adult 04/05/2017   Prediabetes 02/04/2017   Dyspnea on exertion 12/23/2016   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 12/23/2016   Hemoptysis  12/23/2016   Fibromyalgia 08/18/2016   Chronic lymphocytic thyroiditis 08/18/2016   Persistent depressive disorder 04/01/2016   OSA (obstructive sleep apnea) 04/01/2016   Essential hypertension 12/09/2015   Hypothyroidism 12/09/2015   Morbid obesity due to excess calories (HCC) 12/09/2015   Constipation 05/27/2015   Fatty liver 05/27/2015   Abdominal pain, chronic, epigastric 07/04/2012   History of gastroesophageal reflux (GERD) 06/09/2012   Hyperlipidemia 06/09/2012   Refusal of blood transfusions as patient is Jehovah's Witness 06/09/2012   Anxiety 06/09/2012   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 Pt reports that she has noticed improvements.  PAIN: Are you having pain? Yes: NPRS scale: 6/10 Pain location: B knees (L>R) Pain description: sharp, achy Aggravating factors: straightening her knees, standing and walking Relieving factors: exercise, ice, heat, pain meds  Are you having pain? Yes: NPRS scale: 7/10 Pain location: B hips (R>L) Pain description: sharp, achy Aggravating factors: moving in the bed Relieving factors: exercise, ice, heat, pain meds  Bil shoulders 5/10 pain  PERTINENT HISTORY: HTN, DM, OSA, fibromyalgia, depression, prediabetes, DOE, obesity, GERD, hypothyroidism, OA of multiple joints (neck, shoulders, elbows, hands, back, hips, knees),  multilevel DDD, lumbar radiculopathy, OSA, pituitary adenoma status post resection   PRECAUTIONS: Fall  HAND DOMINANCE: Right  WEIGHT BEARING RESTRICTIONS: No  FALLS:  Has patient fallen in last 6 months? No  LIVING ENVIRONMENT: Lives with: lives with their spouse Lives in: House/apartment Stairs: Yes: External: 3 steps; ramp available Has following equipment at home: Counselling psychologist, Environmental consultant - 2 wheeled, Wheelchair (power), shower chair, bed side commode, Grab bars, and Ramped entry  OCCUPATION: Retired  PLOF: Independent with basic ADLs, Independent with household mobility with device, Requires assistive device for independence, Needs assistance with homemaking, and Leisure: sedentary  PATIENT GOALS: "I want to get mobile enough to have TKR surgery."   OBJECTIVE: (objective measures completed at initial evaluation unless otherwise dated)  DIAGNOSTIC FINDINGS:  02/25/22 - B knee x-rays:  Advanced tricompartmental degenerative joint disease. Bone-on-bone joint space narrowing. Varus deformity.  PATIENT SURVEYS:  ABC scale 260 / 1600 = 16.3 % LEFS 38 / 80 = 47.5 %  COGNITION: Overall cognitive status: Within functional limits for tasks assessed     SENSATION: WFL  MUSCLE LENGTH: Hamstrings: severely tight B ITB: NT Piriformis: mod/severe tight B Hip flexors: severely tight B Quads: mod/severe tight B Heelcord: NT  POSTURE:  flexed trunk  and severe crouch in standing d/t hip and knee flexion contractures  LOWER EXTREMITY ROM:  Active & Passive ROM Right eval Left eval Right 03/31/23 Left 03/31/23  Hip flexion -30 -30    Hip extension   -21 from neutral reclined position on white bolster -23 from neutral reclined position on white bolster  Hip abduction      Hip adduction      Hip internal rotation      Hip external rotation      Knee flexion -31 -43    Knee extension 120 112 -27 -38  Ankle dorsiflexion      Ankle plantarflexion      Ankle inversion       Ankle eversion       (Blank rows = not tested)  LOWER EXTREMITY MMT:  MMT Right eval Left eval  Hip flexion 4+ 4+  Hip extension 4 for available ROM 4 for available ROM  Hip abduction 4- 4-  Hip adduction 4 4  Hip internal rotation 4+ 4+  Hip external rotation 4 4-  Knee flexion 4+ 4+  Knee extension 4+ for available ROM 4+ for available ROM  Ankle dorsiflexion 4+ 4+  Ankle plantarflexion    Ankle inversion    Ankle eversion     (Blank rows = not tested)  FUNCTIONAL TESTS: (03/24/23) 5 times sit to stand: 21.78 sec w/o UE assist (unable to achieve full upright posture due to hip  and knee flexion contractures) Timed up and go (TUG): 47.71 sec with RW 10 meter walk test: 30.31 sec with RW; Gait speed = 1.08 ft/sec with RW  BED MOBILITY:  Sit to supine SBA Supine to sit Mod A Rolling to Right Min A Rolling to Left Min A  TRANSFERS: Assistive device utilized: Environmental consultant - 2 wheeled  Sit to stand: SBA Stand to sit: SBA Chair to chair: SBA Floor:  NT  GAIT: Distance walked: 3-4 steps from power wc to treatment table Assistive device utilized: Environmental consultant - 2 wheeled Level of assistance: CGA Gait pattern: step to pattern, decreased stride length, knee flexed in stance- Right, knee flexed in stance- Left, shuffling, antalgic, and trunk flexed Comments: severe crouch in standing/with gait d/t B hip and knee flexion contractures  RAMP: Level of Assistance:  NT Assistive device utilized:  NT Ramp Comments:   CURB:  Level of Assistance:  NT Assistive device utilized:  NT Curb Comments:   STAIRS:  Level of Assistance:  NT  Stair Negotiation Technique:   Number of Stairs:    Height of Stairs:   Comments:    TODAY'S TREATMENT: 03/29/23 THERAPEUTIC EXERCISE: to improve flexibility, strength and mobility.  Demonstration, verbal and tactile cues throughout for technique. Nustep L4x62min with seat back enough to get knee/hip ext stretch Self- STM with small roller x 1 min ea  prior to stretches to help with discomfort and increase blood flow. Seated LAQ 2 x 10 R/L Seated heel/toe raises x 10  Seated gastroc/HS stretch combo with gait belt x2 30 sec B Reclined hip flexor stretch onto white bolster + blue bolster for head rest x 2 min Seated hamstring stretch hip hinge x 30 sec B Gait 44 ft with RW CGA with gait belt Discussed pt standing at counter at home and doing active trunk extension and glute squeeze for a dynamic hip flexor stretch   03/29/23 THERAPEUTIC EXERCISE: to improve flexibility, strength and mobility.  Demonstration, verbal and tactile cues throughout for technique. Seated hamstring stretch hip hinge x 30 sec B Seated hamstring stretch with foot propped up in chair x 30 sec B Seated gastroc stretch with gait belt x 30 sec Seated LAQ x 10 R/L Seated heel/toe raises x 10  Gait 50 ft with RW CGA with gait belt Nustep L3x62min  03/24/23 THERAPEUTIC ACTIVITIES: 5xSTS = 21.78 sec w/o UE assist (unable to achieve full upright posture due to hip and knee flexion contractures) TUG = 47.71 sec with RW = 30.31 sec with RW Gait speed = 1.08 ft/sec with RW  THERAPEUTIC EXERCISE: to improve flexibility, strength and mobility.  Demonstration, verbal and tactile cues throughout for technique. Seated hip hinge HS stretch x 30" bil Seated hip hinge HS stretch + strap for gastroc stretch x 30" bil - pt reporting too intense Seated hip hinge HS stretch with foot on 9" stool x 30" bil (HEP) Seated gastroc stretch with strap and foot elevated on 9" stool x 30" bil (HEP) L Mod thomas hip flexor stretch x 30" - pt reporting too intense Supine SKTC with opp LE straight for hip flexor stretch x 30" bil (HEP)   03/22/23 Initial eval Educated pt on positional stretch laying supine in bed to promote B hip and knee extension stretch   PATIENT EDUCATION:  Education details: initial HEP and positioning in bed to promote hip and knee extension Person educated:  Patient Education method: Explanation, Demonstration, Actor cues, Verbal cues, and Handouts Education comprehension: verbalized understanding,  returned demonstration, verbal cues required, tactile cues required, and needs further education  HOME EXERCISE PROGRAM: Access Code: [facility] URL: [email or web address] Date: 03/24/2023 Prepared by: Glenetta Hew  Exercises - Seated Hamstring Stretch  - 2-3 x daily - 7 x weekly - 3 reps - 30 sec hold - Seated Gastroc Stretch with Strap  - 2-3 x daily - 7 x weekly - 3 reps - 30 sec hold - Single Knee to Chest Stretch  - 2-3 x daily - 7 x weekly - 3 reps - 30 sec hold   ASSESSMENT:  CLINICAL IMPRESSION: ADASHA BOEHME is already making some progress with HS flexibility. She reports she feels her right leg especially feels looser. She reports increased soreness in B knees secondary to her increased stretching. Trial of STM with roller to HS and gastroc prior to stretching today had positive results. She still demonstrates marked ROM deficits in B hips and knees limiting standing posture and gait. Laurann continues to demonstrate potential for improvement and would benefit from continued skilled therapy to address impairments.    OBJECTIVE IMPAIRMENTS: Abnormal gait, decreased activity tolerance, decreased balance, decreased endurance, decreased knowledge of condition, decreased knowledge of use of DME, decreased mobility, difficulty walking, decreased ROM, decreased strength, decreased safety awareness, hypomobility, increased fascial restrictions, impaired perceived functional ability, increased muscle spasms, impaired flexibility, improper body mechanics, postural dysfunction, and pain.   ACTIVITY LIMITATIONS: carrying, lifting, standing, squatting, sleeping, stairs, transfers, bed mobility, locomotion level, and caring for others  PARTICIPATION LIMITATIONS: meal prep, cleaning, laundry, driving, shopping, community activity, and yard  work  PERSONAL FACTORS: Education, Financial risk analyst, Past/current experiences, Time since onset of injury/illness/exacerbation, and 3+ comorbidities: HTN, DM, OSA, fibromyalgia, depression, prediabetes, DOE, obesity, GERD, hypothyroidism, OA of multiple joints (neck, shoulders, elbows, hands, back, hips, knees), multilevel DDD, lumbar radiculopathy, OSA, pituitary adenoma status post resection  are also affecting patient's functional outcome.   REHAB POTENTIAL: Fair due to severity of contractures and prolonged limited mobility over several years  CLINICAL DECISION MAKING: Evolving/moderate complexity  EVALUATION COMPLEXITY: Moderate   GOALS: Goals reviewed with patient? Yes  SHORT TERM GOALS: Target date: 04/19/2023   Patient will be independent with initial HEP. Baseline: Formal HEP to be established next visit.  Education provided on positional stretching for hip and knee extension in bed on eval. Goal status: IN PROGRESS  2.  Patient will demonstrate >/= 5 degree reduction in B hip and knee flexion contractures. Baseline: Refer to above LE ROM table. Goal status: IN PROGRESS  3.  Patient will be able to ambulate 30+ ft with RW and SBA of PT, demonstrating improved upright posture for more normal gait pattern to increase functional ambulation in home.  Baseline: 3-4 steps with RW in severe crouch with CGA of PT Goal status: IN PROGRESS  LONG TERM GOALS: Target date: 05/17/2023   Patient will be independent with advanced/ongoing HEP to improve outcomes and carryover.  Baseline:  Goal status: IN PROGRESS  2.  Patient will report at least >/= 50% improvement in B hip and knee pain to improve QOL. Baseline: 7/10 Goal status: IN PROGRESS  3.  Patient will demonstrate improved B hip and knee AROM to [facility] to allow for normal stance and gait mechanics. Baseline: Refer to above LE ROM table. Goal status: IN PROGRESS  4.  Patient will demonstrate improved B LE strength to >/= 4+/5 for improved  stability and ease of mobility. Baseline: Refer to above LE MMT table Goal status: IN PROGRESS  5.  Patient will be able to ambulate 50-75 ft with RW or LRAD and improved upright posture for more normal gait pattern to increase functional ambulation in home.  Baseline: 3-4 steps with RW in severe crouch due to B hip and knee flexion contracture Goal status: IN PROGRESS  6.  Patient will report >/= 47/80 on LEFS to demonstrate improved functional ability. Baseline: 38 / 80 = 47.5 % Goal status: IN PROGRESS  7.  Patient will report >/= 35% on ABC scale to demonstrate improved functional ability and activity confidence with decreased fear of falling. Baseline: 260 / 1600 = 16.3 % Goal status: IN PROGRESS  8.  Patient will demonstrate decreased TUG time by >/= 10 sec to decrease risk for falls with transitional mobility. Baseline: 47.71 sec with RW (03/24/23) Goal status: IN PROGRESS    PLAN:  PT FREQUENCY: 1-2x/week  PT DURATION: 8 weeks  PLANNED INTERVENTIONS: Therapeutic exercises, Therapeutic activity, Neuromuscular re-education, Balance training, Gait training, Patient/Family education, Self Care, Joint mobilization, Stair training, Aquatic Therapy, Dry Needling, Electrical stimulation, Cryotherapy, Moist heat, Taping, Vasopneumatic device, Ultrasound, Ionotophoresis 4mg /ml Dexamethasone, Manual therapy, and Re-evaluation  PLAN FOR NEXT SESSION: Review/update HEP for LE stretching and progress to include LE strengthening; MT +/- DN to help reduce muscle tension and improve ROM/reduce muscle contractures   Solon Palm, PT  03/31/2023, 10:19 AM [facility] [address] [place], [place] [address] [phone]  Fax: [phone]

## 2023-03-31 ENCOUNTER — Encounter: Payer: Self-pay | Admitting: Physical Therapy

## 2023-03-31 ENCOUNTER — Ambulatory Visit: Payer: Medicaid Other | Admitting: Physical Therapy

## 2023-03-31 DIAGNOSIS — R2689 Other abnormalities of gait and mobility: Secondary | ICD-10-CM | POA: Diagnosis not present

## 2023-03-31 DIAGNOSIS — R262 Difficulty in walking, not elsewhere classified: Secondary | ICD-10-CM

## 2023-03-31 DIAGNOSIS — M6281 Muscle weakness (generalized): Secondary | ICD-10-CM

## 2023-03-31 DIAGNOSIS — M6249 Contracture of muscle, multiple sites: Secondary | ICD-10-CM

## 2023-03-31 NOTE — Telephone Encounter (Signed)
Called & spoke to [facility] with [facility]. Confirmed that fax sent on 03/24/2023 were received. Judeth Cornfield confirmed that forms were received but was faxed back to update the date of onset of CFRA in MM/DD/YYYY format.

## 2023-04-05 ENCOUNTER — Ambulatory Visit (INDEPENDENT_AMBULATORY_CARE_PROVIDER_SITE_OTHER): Payer: MEDICAID | Admitting: Clinical

## 2023-04-05 DIAGNOSIS — F341 Dysthymic disorder: Secondary | ICD-10-CM
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
## 2023-04-06 ENCOUNTER — Other Ambulatory Visit: Payer: Self-pay | Admitting: Bariatrics

## 2023-04-06 ENCOUNTER — Other Ambulatory Visit: Payer: Self-pay | Admitting: Internal Medicine

## 2023-04-06 DIAGNOSIS — F5089 Other specified eating disorder: Secondary | ICD-10-CM

## 2023-04-07 ENCOUNTER — Encounter: Payer: Medicaid Other | Admitting: Gastroenterology

## 2023-04-07 ENCOUNTER — Telehealth: Payer: Self-pay | Admitting: Internal Medicine

## 2023-04-07 DIAGNOSIS — E1142 Type 2 diabetes mellitus with diabetic polyneuropathy: Secondary | ICD-10-CM

## 2023-04-07 MED ORDER — GABAPENTIN 600 MG PO TABS: ORAL_TABLET | ORAL | 1 refills | Status: DC

## 2023-04-07 NOTE — Telephone Encounter (Signed)
RF sent on Gabapentin. 

## 2023-04-12 ENCOUNTER — Ambulatory Visit: Payer: Self-pay | Admitting: *Deleted

## 2023-04-12 ENCOUNTER — Encounter: Payer: Medicaid Other | Admitting: Physical Therapy

## 2023-04-12 DIAGNOSIS — E1142 Type 2 diabetes mellitus with diabetic polyneuropathy: Secondary | ICD-10-CM

## 2023-04-12 MED ORDER — GABAPENTIN 600 MG PO TABS
ORAL_TABLET | ORAL | 1 refills | Status: DC
Start: 2023-04-12 — End: 2023-10-25

## 2023-04-12 NOTE — Telephone Encounter (Signed)
  Chief Complaint: gabapentin 300 mg denied to refill per pharmacist, Amy from [facility]. Do you want to continue refills? Symptoms: gabapentin 300 mg not on medication list  Frequency: na  Pertinent Negatives: Patient denies na  Disposition: [] ED /[] Urgent Care (no appt availability in office) / [] Appointment(In office/virtual)/ []  [facility]/ [] Home Care/ [] Refused Recommended Disposition /[] [facility]/ [x]  Follow-up with PCP Additional Notes:  Please advise if patient is to continue getting refills for gabapentin 300 mg? Not on med list. Please call Amy from [facility] . Last OV notes gabapentin  600 mg on med list not 300 mg. Please clarify.    Reason for Disposition  [1] Pharmacy calling with prescription question AND [2] triager unable to answer question  Answer Assessment - Initial Assessment Questions 1. NAME of MEDICINE: "What medicine(s) are you calling about?"     Pharmacist Amy, requesting clarification if gabapentin 300 mg still ordered? 2. QUESTION: "What is your question?" (e.g., double dose of medicine, side effect)      requesting if gabapentin 300 mg still ordered to take with gabapentin 600 mg to make 900 mg?  Recent denial of medication last ordered 09/01/22.  3. PRESCRIBER: "Who prescribed the medicine?" Reason: if prescribed by specialist, call should be referred to that group.     Dr. Laural Benes per pharmacist 4. SYMPTOMS: "Do you have any symptoms?" If Yes, ask: "What symptoms are you having?"  "How bad are the symptoms (e.g., mild, moderate, severe)     Na  5. PREGNANCY:  "Is there any chance that you are pregnant?" "When was your last menstrual period?"     Na  Protocols used: Medication Question Call-A-AH

## 2023-04-13 NOTE — Telephone Encounter (Signed)
Robert with [facility] needs clarification on the Gabapentin   CB# [phone]

## 2023-04-13 NOTE — Therapy (Addendum)
OUTPATIENT PHYSICAL THERAPY TREATMENT   Patient Name: Robin [name] MRN: [id number] DOB:05/24/1956, 67 y.o., female Today's Date: 04/14/2023  END OF SESSION:  PT End of Session - 04/14/23 1353     Visit Number 5    Date for PT Re-Evaluation 05/17/23    Authorization Type [employer]    Authorization Time Period 04/14/23-05/25/23    Authorization - Visit Number 1    Authorization - Number of Visits 12    PT Start Time 1352    PT Stop Time 1440    PT Time Calculation (min) 48 min    Activity Tolerance Patient tolerated treatment well    Behavior During Therapy [facility] for tasks assessed/performed             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   Anemia    Anxiety disorder    Arthritis    Asthma    Back pain    Chronic neck pain    Colon polyps    Constipation    Depression    Diabetes mellitus without complication (HCC)    Dyspnea    Edema of lower extremity    Fibromyalgia    GERD (gastroesophageal reflux disease)    Heartburn    Hiatal hernia    small   Hyperlipidemia    Hypertension    Hypothyroidism    Joint pain    Morbid obesity with BMI of 50.0-59.9, adult (HCC)    Osteoarthritis    Other fatigue    Schatzki's ring    non critical   Severe episode of recurrent major depressive disorder, with psychotic features (HCC) 05/26/2018   Sinusitis    Sleep apnea    CPAP, Sleep study at [facility]   SOB (shortness of breath) on exertion    Status post dilation of esophageal narrowing    Swallowing difficulty    Past Surgical History:  Procedure Laterality Date   ABDOMINAL HYSTERECTOMY  02/18/2000   CARDIAC CATHETERIZATION  08/18/2003   normal L main/LAD/L Cfx/RCA (Dr. Erlene Quan)   COLONOSCOPY  2006   Dr. Franky Macho, hyperplastic polyps   COLONOSCOPY  08/06/2011   abnormal terminal ileum for 10cm, erosions, geographical ulceration. Bx small bowel mucosa  with prominent intramucosal lymphoid aggregates, slightly inflammed   COLONOSCOPY WITH PROPOFOL N/A 12/28/2019   Rourk: 4 sessile serrated adenomas removed on the colon.  Next colonoscopy in 3 years.   DIRECT LARYNGOSCOPY  03/15/2012   Procedure: DIRECT LARYNGOSCOPY;  Surgeon: Flo Shanks, MD;  Location: [facility] OR;  Service: ENT;  Laterality: N/A; Dr. Wolicki--:>no foreign body seen. normal esophagus to 40cm   ESOPHAGEAL DILATION  04/17/2015   Procedure: ESOPHAGEAL DILATION;  Surgeon: Corbin Ade, MD;  [facility];  Service: Endoscopy;;   ESOPHAGOGASTRODUODENOSCOPY  08/23/2008   WUJ:WJXB distal esophageal erosions consistent with mild erosive reflux esophagitis, otherwise unremarkable esophagus/ Tiny antral erosions of doubtful clinical significance, otherwise normal stomach, patent pylorus, normal D1 and D2   ESOPHAGOGASTRODUODENOSCOPY  08/06/2011   small hh, noncritical Schatzki's ring s/p 56 F   ESOPHAGOGASTRODUODENOSCOPY N/A 04/17/2015   JYN:WGNFAO s/p dilation   ESOPHAGOGASTRODUODENOSCOPY (EGD) WITH PROPOFOL N/A 01/20/2018   Dr. Jena Gauss: Erosive gastropathy, normal-appearing esophagus status post empiric dilation.  Chronic gastritis, no H. pylori.   ESOPHAGOSCOPY  03/15/2012   Procedure: ESOPHAGOSCOPY;  Surgeon: Flo Shanks, MD;  Location: [facility] OR;  Service: ENT;  Laterality: N/A;  KNEE ARTHROSCOPY Left 04/27/2001   MALONEY DILATION N/A 01/20/2018   Procedure: Elease Hashimoto DILATION;  Surgeon: Corbin Ade, MD;  [facility];  Service: Endoscopy;  Laterality: N/A;   NM MYOCAR PERF WALL MOTION  2003   negative bruce protocol exercise tress test; EF 68%; intermediate risk study due to evidence of anterior wall ischemia extending from mid-ventricle to apex   PITUITARY SURGERY  06/2012   benign tumor, surgeon at [facility]   POLYPECTOMY  12/28/2019   Procedure: POLYPECTOMY;  Surgeon: Corbin Ade, MD;  [facility];  Service: Endoscopy;;   RECTOCELE REPAIR      TRANSTHORACIC ECHOCARDIOGRAM  2003   EF normal   VAGINAL PROLAPSE REPAIR     VIDEO BRONCHOSCOPY Bilateral 03/08/2017   Procedure: VIDEO BRONCHOSCOPY WITHOUT FLUORO;  Surgeon: Roslynn Amble, MD;  Location[facility] END[facility];  Service: Cardiopulmonary;  Laterality: Bilateral;   Patient Active Problem List   Diagnosis Date Noted   Type 2 diabetes mellitus with obesity (HCC) 02/04/2023   Moderate major depression (HCC) 11/12/2022   BMI 40.0-44.9, adult (HCC) 11/03/2022   B12 deficiency 10/06/2022   Depression 07/09/2022   Class 3 severe obesity with serious comorbidity and body mass index (BMI) of 45.0 to 49.9 in adult [facility]) 07/09/2022   Major depressive disorder, recurrent episode (HCC) 07/02/2022   Generalized anxiety disorder 07/02/2022   Body mass index 40.0-44.9, adult (HCC) 02/25/2022   Morbid obesity (HCC) 02/25/2022   Right knee pain 02/18/2022   Diabetes mellitus type 2 in obese 02/18/2022   Lumbar foraminal stenosis 01/23/2022   Lumbar radiculopathy 01/23/2022   Pain in both knees 01/23/2022   Vitamin D deficiency 11/18/2021   Polyp of descending colon 02/15/2020   Pituitary tumor 08/21/2019   Primary osteoarthritis of both hips 08/21/2019   Primary osteoarthritis of both knees 08/21/2019   Pain in left shoulder 08/21/2019   Morbid obesity with BMI of 60.0-69.9, adult (HCC) 07/17/2019   Pedal edema 07/17/2019   Multilevel degenerative disc disease 10/31/2018   Change in bowel function 10/28/2018   Gastritis and gastroduodenitis 04/26/2018   Atrophic vaginitis 04/01/2018   Cough, persistent 02/08/2018   Dysphagia 12/14/2017   Generalized OA 07/30/2017   Urinary incontinence 07/23/2017   Early satiety 06/16/2017   Hx of iron deficiency anemia 05/28/2017   Throat pain in adult 04/05/2017   Prediabetes 02/04/2017   Dyspnea on exertion 12/23/2016   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 12/23/2016   Hemoptysis 12/23/2016    Fibromyalgia 08/18/2016   Chronic lymphocytic thyroiditis 08/18/2016   Persistent depressive disorder 04/01/2016   OSA (obstructive sleep apnea) 04/01/2016   Essential hypertension 12/09/2015   Hypothyroidism 12/09/2015   Morbid obesity due to excess calories (HCC) 12/09/2015   Constipation 05/27/2015   Fatty liver 05/27/2015   Abdominal pain, chronic, epigastric 07/04/2012   History of gastroesophageal reflux (GERD) 06/09/2012   Hyperlipidemia 06/09/2012   Refusal of blood transfusions as patient is Jehovah's Witness 06/09/2012   Anxiety 06/09/2012   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  PAIN: Are you having pain? Yes: NPRS scale: 6/10 Pain location: B knees (L>R) Pain description: sharp, achy Aggravating factors: straightening her knees, standing and walking Relieving factors: exercise, ice, heat, pain meds  Are you having pain? Yes: NPRS scale: 6/10 Pain location: B hips (R>L) Pain description: sharp, achy Aggravating factors: moving in the bed Relieving factors: exercise, ice, heat, pain meds  Bil shoulders 5/10 pain  PERTINENT HISTORY: HTN, DM, OSA, fibromyalgia, depression, prediabetes, DOE, obesity, GERD, hypothyroidism, OA of multiple joints (neck, shoulders, elbows, hands, back, hips, knees), multilevel DDD, lumbar radiculopathy, OSA, pituitary adenoma status  post resection   PRECAUTIONS: Fall  HAND DOMINANCE: Right  WEIGHT BEARING RESTRICTIONS: No  FALLS:  Has patient fallen in last 6 months? No  LIVING ENVIRONMENT: Lives with: lives with their spouse Lives in: House/apartment Stairs: Yes: External: 3 steps; ramp available Has following equipment at home: Counselling psychologist, Environmental consultant - 2 wheeled, Wheelchair (power), shower chair, bed side commode, Grab bars, and Ramped entry  OCCUPATION: Retired  PLOF: Independent with basic ADLs, Independent with household mobility with device, Requires assistive device for independence, Needs assistance with homemaking, and Leisure: sedentary  PATIENT GOALS: "I want to get mobile enough to have TKR surgery."   OBJECTIVE: (objective measures completed at initial evaluation unless otherwise dated)  DIAGNOSTIC FINDINGS:  02/25/22 - B knee x-rays:  Advanced tricompartmental degenerative joint disease. Bone-on-bone joint space narrowing. Varus deformity.  PATIENT SURVEYS:  ABC scale 260 / 1600 = 16.3 % LEFS 38 / 80 = 47.5 %  COGNITION: Overall cognitive status: Within functional limits for tasks assessed     SENSATION: WFL  MUSCLE LENGTH: Hamstrings: severely tight B ITB: NT Piriformis: mod/severe tight B Hip flexors: severely tight B Quads: mod/severe tight B Heelcord: NT  POSTURE:  flexed trunk  and severe crouch in standing d/t hip and knee flexion contractures  LOWER EXTREMITY ROM:  Active & Passive ROM Right eval Left eval Right 03/31/23 Left 03/31/23  Hip flexion      Hip extension -30 -30 -21 from neutral reclined position on white bolster -23 from neutral reclined position on white bolster  Hip abduction      Hip adduction      Hip internal rotation      Hip external rotation      Knee flexion 120 112    Knee extension -31 -43 -27 -38  Ankle dorsiflexion      Ankle plantarflexion      Ankle inversion      Ankle eversion       (Blank rows = not tested)  LOWER  EXTREMITY MMT:  MMT Right eval Left eval  Hip flexion 4+ 4+  Hip extension 4 for available ROM 4 for available ROM  Hip abduction 4- 4-  Hip adduction 4 4  Hip internal rotation 4+ 4+  Hip external rotation 4 4-  Knee flexion 4+ 4+  Knee extension 4+ for available ROM 4+ for available ROM  Ankle dorsiflexion 4+ 4+  Ankle plantarflexion    Ankle inversion    Ankle eversion     (Blank rows = not tested)  FUNCTIONAL TESTS: (03/24/23) 5 times sit to stand: 21.78 sec w/o UE assist (unable to achieve full upright posture due to hip and knee flexion contractures) Timed up and go (TUG): 47.71 sec with RW  10 meter walk test: 30.31 sec with RW; Gait speed = 1.08 ft/sec with RW  BED MOBILITY:  Sit to supine SBA Supine to sit Mod A Rolling to Right Min A Rolling to Left Min A  TRANSFERS: Assistive device utilized: Environmental consultant - 2 wheeled  Sit to stand: SBA Stand to sit: SBA Chair to chair: SBA Floor:  NT  GAIT: Distance walked: 3-4 steps from power wc to treatment table Assistive device utilized: Environmental consultant - 2 wheeled Level of assistance: CGA Gait pattern: step to pattern, decreased stride length, knee flexed in stance- Right, knee flexed in stance- Left, shuffling, antalgic, and trunk flexed Comments: severe crouch in standing/with gait d/t B hip and knee flexion contractures  RAMP: Level of Assistance:  NT Assistive device utilized:  NT Ramp Comments:   CURB:  Level of Assistance:  NT Assistive device utilized:  NT Curb Comments:   STAIRS:  Level of Assistance:  NT  Stair Negotiation Technique:   Number of Stairs:    Height of Stairs:   Comments:    TODAY'S TREATMENT: 04/14/23 THERAPEUTIC EXERCISE: to improve flexibility, strength and mobility.  Demonstration, verbal and tactile cues throughout for technique. Nustep L4x56min with seat back enough to get knee/hip ext stretch Self- STM with small roller x 1 min ea prior to stretches to help with discomfort and increase blood  flow. Seated LAQ 2 x 10 R/L Seated soleus stretch B 2x30 sec Seated gastroc/HS stretch combo with gait belt x2 30 sec B Standing 4 x 1 min at TM with cues to engage glutes and quads Standing heel raise at TM x 10 GAIT with RW CGA with gait belt 4 trials: 2x 20, 1 x 40, 1 x 30 Encouraged pt to use lift chair to help with hip flexor stretch by lowering legs; also using towel roll under ankles while in recliner for prolonged HS stretch.  03/29/23 THERAPEUTIC EXERCISE: to improve flexibility, strength and mobility.  Demonstration, verbal and tactile cues throughout for technique. Nustep L4x50min with seat back enough to get knee/hip ext stretch Self- STM with small roller x 1 min ea prior to stretches to help with discomfort and increase blood flow. Seated LAQ 2 x 10 R/L Seated heel/toe raises x 10  Seated gastroc/HS stretch combo with gait belt x2 30 sec B Reclined hip flexor stretch onto white bolster + blue bolster for head rest x 2 min Seated hamstring stretch hip hinge x 30 sec B Gait 44 ft with RW CGA with gait belt Discussed pt standing at counter at home and doing active trunk extension and glute squeeze for a dynamic hip flexor stretch   03/29/23 THERAPEUTIC EXERCISE: to improve flexibility, strength and mobility.  Demonstration, verbal and tactile cues throughout for technique. Seated hamstring stretch hip hinge x 30 sec B Seated hamstring stretch with foot propped up in chair x 30 sec B Seated gastroc stretch with gait belt x 30 sec Seated LAQ x 10 R/L Seated heel/toe raises x 10  Gait 50 ft with RW CGA with gait belt Nustep L3x69min  03/24/23 THERAPEUTIC ACTIVITIES: 5xSTS = 21.78 sec w/o UE assist (unable to achieve full upright posture due to hip and knee flexion contractures) TUG = 47.71 sec with RW = 30.31 sec with RW Gait speed = 1.08 ft/sec with RW  THERAPEUTIC EXERCISE: to improve flexibility, strength and mobility.  Demonstration, verbal and tactile cues  throughout for technique. Seated hip hinge HS stretch x 30" bil Seated hip hinge HS stretch +  strap for gastroc stretch x 30" bil - pt reporting too intense Seated hip hinge HS stretch with foot on 9" stool x 30" bil (HEP) Seated gastroc stretch with strap and foot elevated on 9" stool x 30" bil (HEP) L Mod thomas hip flexor stretch x 30" - pt reporting too intense Supine SKTC with opp LE straight for hip flexor stretch x 30" bil (HEP)   03/22/23 Initial eval Educated pt on positional stretch laying supine in bed to promote B hip and knee extension stretch   PATIENT EDUCATION:  Education details: initial HEP and positioning in bed to promote hip and knee extension Person educated: Patient Education method: Programmer, multimedia, Demonstration, Tactile cues, Verbal cues, and Handouts Education comprehension: verbalized understanding, returned demonstration, verbal cues required, tactile cues required, and needs further education  HOME EXERCISE PROGRAM: Access Code: [facility] URL: [email or web address] Date: 04/14/2023 Prepared by: Raynelle Fanning  Exercises - Seated Hamstring Stretch  - 2-3 x daily - 7 x weekly - 3 reps - 30 sec hold - Seated Gastroc Stretch with Strap  - 2-3 x daily - 7 x weekly - 3 reps - 30 sec hold - Single Knee to Chest Stretch  - 2-3 x daily - 7 x weekly - 3 reps - 30 sec hold - Seated Long Arc Quad  - 1 x daily - 7 x weekly - 2 sets - 10 reps - Wide Stance with Counter Support  - 1 x daily - 7 x weekly - 1 sets - 5 reps - 1 min hold - Supine Knee Extension Stretch on Towel Roll  - 1 x daily - 7 x weekly - 1 sets - 3 reps - 60 sec  hold   ASSESSMENT:  CLINICAL IMPRESSION: Madelene did well with TE and gait today despite reporting increased stiffness from the A/C running all the time at home. She showed good endurance to standing and PT encouraged increasing the frequency at home. Also encouraged using her lift chair to assist with stretches as she is sitting in it  quite a lot during the day. Amisadai continues to demonstrate potential for improvement and would benefit from continued skilled therapy to address impairments.   OBJECTIVE IMPAIRMENTS: Abnormal gait, decreased activity tolerance, decreased balance, decreased endurance, decreased knowledge of condition, decreased knowledge of use of DME, decreased mobility, difficulty walking, decreased ROM, decreased strength, decreased safety awareness, hypomobility, increased fascial restrictions, impaired perceived functional ability, increased muscle spasms, impaired flexibility, improper body mechanics, postural dysfunction, and pain.   ACTIVITY LIMITATIONS: carrying, lifting, standing, squatting, sleeping, stairs, transfers, bed mobility, locomotion level, and caring for others  PARTICIPATION LIMITATIONS: meal prep, cleaning, laundry, driving, shopping, community activity, and yard work  PERSONAL FACTORS: Education, Financial risk analyst, Past/current experiences, Time since onset of injury/illness/exacerbation, and 3+ comorbidities: HTN, DM, OSA, fibromyalgia, depression, prediabetes, DOE, obesity, GERD, hypothyroidism, OA of multiple joints (neck, shoulders, elbows, hands, back, hips, knees), multilevel DDD, lumbar radiculopathy, OSA, pituitary adenoma status post resection  are also affecting patient's functional outcome.   REHAB POTENTIAL: Fair due to severity of contractures and prolonged limited mobility over several years  CLINICAL DECISION MAKING: Evolving/moderate complexity  EVALUATION COMPLEXITY: Moderate   GOALS: Goals reviewed with patient? Yes  SHORT TERM GOALS: Target date: 04/19/2023   Patient will be independent with initial HEP. Baseline: Formal HEP to be established next visit.  Education provided on positional stretching for hip and knee extension in bed on eval. Goal status: IN PROGRESS  2.  Patient will demonstrate >/= 5 degree reduction  in B hip and knee flexion contractures. Baseline: Refer  to above LE ROM table. Goal status: PARTIALLY MET  03/31/23 - Met except R knee flexion contracture only improved by 4  3.  Patient will be able to ambulate 30+ ft with RW and SBA of PT, demonstrating improved upright posture for more normal gait pattern to increase functional ambulation in home.  Baseline: 3-4 steps with RW in severe crouch with CGA of PT Goal status: MET  03/31/23  LONG TERM GOALS: Target date: 05/17/2023   Patient will be independent with advanced/ongoing HEP to improve outcomes and carryover.  Baseline:  Goal status: IN PROGRESS  2.  Patient will report at least >/= 50% improvement in B hip and knee pain to improve QOL. Baseline: 7/10 Goal status: IN PROGRESS  3.  Patient will demonstrate improved B hip and knee AROM to [facility] to allow for normal stance and gait mechanics. Baseline: Refer to above LE ROM table. Goal status: IN PROGRESS  4.  Patient will demonstrate improved B LE strength to >/= 4+/5 for improved stability and ease of mobility. Baseline: Refer to above LE MMT table Goal status: IN PROGRESS  5.  Patient will be able to ambulate 50-75 ft with RW or LRAD and improved upright posture for more normal gait pattern to increase functional ambulation in home.  Baseline: 3-4 steps with RW in severe crouch due to B hip and knee flexion contracture Goal status: IN PROGRESS  6.  Patient will report >/= 47/80 on LEFS to demonstrate improved functional ability. Baseline: 38 / 80 = 47.5 % Goal status: IN PROGRESS  7.  Patient will report >/= 35% on ABC scale to demonstrate improved functional ability and activity confidence with decreased fear of falling. Baseline: 260 / 1600 = 16.3 % Goal status: IN PROGRESS  8.  Patient will demonstrate decreased TUG time by >/= 10 sec to decrease risk for falls with transitional mobility. Baseline: 47.71 sec with RW (03/24/23) Goal status: IN PROGRESS    PLAN:  PT FREQUENCY: 1-2x/week  PT DURATION: 8 weeks  PLANNED  INTERVENTIONS: Therapeutic exercises, Therapeutic activity, Neuromuscular re-education, Balance training, Gait training, Patient/Family education, Self Care, Joint mobilization, Stair training, Aquatic Therapy, Dry Needling, Electrical stimulation, Cryotherapy, Moist heat, Taping, Vasopneumatic device, Ultrasound, Ionotophoresis 4mg /ml Dexamethasone, Manual therapy, and Re-evaluation  PLAN FOR NEXT SESSION: Review/update HEP for LE stretching and progress to include LE strengthening; MT +/- DN to help reduce muscle tension and improve ROM/reduce muscle contractures   Solon Palm, PT  04/14/2023, 2:51 PM  [facility] [address] [place], [place] [address] [phone]  Fax: [phone]

## 2023-04-14 ENCOUNTER — Encounter: Payer: Self-pay | Admitting: Physical Therapy

## 2023-04-14 ENCOUNTER — Ambulatory Visit (INDEPENDENT_AMBULATORY_CARE_PROVIDER_SITE_OTHER): Payer: MEDICAID | Admitting: Bariatrics

## 2023-04-14 ENCOUNTER — Ambulatory Visit: Payer: MEDICAID | Attending: Internal Medicine | Admitting: Physical Therapy

## 2023-04-14 ENCOUNTER — Encounter: Payer: Self-pay | Admitting: Bariatrics

## 2023-04-14 DIAGNOSIS — Z6841 Body Mass Index (BMI) 40.0 and over, adult: Secondary | ICD-10-CM

## 2023-04-14 DIAGNOSIS — R262 Difficulty in walking, not elsewhere classified: Secondary | ICD-10-CM | POA: Diagnosis present

## 2023-04-14 DIAGNOSIS — M6281 Muscle weakness (generalized): Secondary | ICD-10-CM | POA: Insufficient documentation

## 2023-04-14 DIAGNOSIS — Z7985 Long-term (current) use of injectable non-insulin antidiabetic drugs: Secondary | ICD-10-CM

## 2023-04-14 DIAGNOSIS — E1169 Type 2 diabetes mellitus with other specified complication: Secondary | ICD-10-CM

## 2023-04-14 DIAGNOSIS — M6249 Contracture of muscle, multiple sites: Secondary | ICD-10-CM | POA: Insufficient documentation

## 2023-04-14 DIAGNOSIS — R2689 Other abnormalities of gait and mobility: Secondary | ICD-10-CM | POA: Diagnosis present

## 2023-04-14 DIAGNOSIS — E669 Obesity, unspecified: Secondary | ICD-10-CM

## 2023-04-14 DIAGNOSIS — F5089 Other specified eating disorder: Secondary | ICD-10-CM | POA: Diagnosis not present

## 2023-04-14 MED ORDER — TRULICITY 4.5 MG/0.5ML ~~LOC~~ SOAJ: 4.5000 mg | SUBCUTANEOUS | 0 refills | Status: DC

## 2023-04-14 MED ORDER — TOPIRAMATE 50 MG PO TABS
ORAL_TABLET | ORAL | 0 refills | Status: DC
Start: 2023-04-14 — End: 2023-05-17

## 2023-04-14 NOTE — Telephone Encounter (Signed)
Called & spoke to the patient. Verified name & DOB. Informed patient that a refill for Gabapentin has been sent to [facility]. Patient expressed verbal understanding.

## 2023-04-14 NOTE — Progress Notes (Signed)
WEIGHT SUMMARY AND BIOMETRICS  Weight Lost Since Last Visit: 1lb  Vitals Temp: 98 F (36.7 C) BP: 116/71 Pulse Rate: 60 SpO2: 97 %   Anthropometric Measurements Height: 5\' 8"  (1.727 m) Weight: 264 lb (119.7 kg) BMI (Calculated): 40.15 Weight at Last Visit: 265lb Weight Lost Since Last Visit: 1lb Starting Weight: 310lb Total Weight Loss (lbs): 46 lb (20.9 kg)   Body Composition  Body Fat %: 51.7 % Fat Mass (lbs): 136.4 lbs Muscle Mass (lbs): 121.2 lbs Total Body Water (lbs): 88.6 lbs Visceral Fat Rating : 17   Other Clinical Data Fasting: no Labs: no Today's Visit #: 24 Starting Date: 06/03/21    OBESITY Robin [name] is here to discuss her progress with her obesity treatment plan along with follow-up of her obesity related diagnoses.     Nutrition Plan: the Category 3 plan - 40% adherence.  Current exercise:  Chair exercises.   Interim History:  She is down 1 lb since her last visit. She has been craving bread.  Eating all of the food on the plan., Protein intake is as prescribed, Meeting calorie goals., Water intake is adequate., and Reports excessive cravings. (  french fries only ).   Pharmacotherapy: Robin [name] is on Robin [name] 4.5 mg SQ weekly and Robin [name] 500 mg twice daily with meals Adverse side effects: None Hunger is moderately controlled.  Cravings are moderately controlled.   Assessment/Plan:   1. Other disorder of eating  Robin [name] has had issues with stress eating, emotional eating, nighttime eating, and boredom eating. Currently this is moderately controlled. Overall mood is stable. Denies suicidal/homicidal ideation. Medication(s): Robin [name] 50 mg BID  Plan:  Discussed social challenges and choosing foods in social situations. Discussed assertive communication skills for coping with social relationships, relapse prevention, and possible challenges ahead and how to overcome. Be sure to get adequate rest as lack of rest can trigger  appetite.  Have plan in place for stressful events.  Consider other rewards besides food.   She will decrease her bread and french fries only once a week.  Will change the Robin [name] 50 mg from bid to 1 in the am and 2 in the pm.   2. Type 2 diabetes mellitus with obesity (HCC)  HgbA1c is at goal. Last A1c was 5.4 CBGs: Not checking      Episodes of hypoglycemia: no Medication(s): Robin [name] 4.5 mg SQ weekly  Lab Results  Component Value Date   HGBA1C 5.4 03/15/2023   HGBA1C 5.3 10/06/2022   HGBA1C 6.3 07/10/2022   Lab Results  Component Value Date   LDLCALC 68 10/06/2022   CREATININE 0.96 10/06/2022   Lab Results  Component Value Date   GFR 87.84 03/08/2018    Plan: Continue and refill Robin [name] 4.5 mg SQ weekly Continue all other medications.  Will keep all carbohydrates low both sweets and starches.  Will continue exercise regimen to 30 to 60 minutes on most days of the week.  Aim for 7 to 9 hours of sleep nightly.  Eat more low glycemic index foods.    Morbid Obesity: Current BMI BMI (Calculated): 40.15   Pharmacotherapy Plan Continue and refill  Robin [name] 4.5 mg SQ weekly  Torian is currently in the action stage of change. As such, her goal is to continue with weight loss efforts.  She has agreed to the Category 3 plan.  Exercise goals: Older adults should do exercises that maintain or improve balance if they are at risk of falling.   Behavioral modification strategies: increasing  lean protein intake, decreasing simple carbohydrates , no meal skipping, meal planning , increase water intake, better snacking choices, and planning for success.  Zawadi has agreed to follow-up with our clinic in 4 weeks.      Objective:   VITALS: Per patient if applicable, see vitals. GENERAL: Alert and in no acute distress. CARDIOPULMONARY: No increased WOB. Speaking in clear sentences.  PSYCH: Pleasant and cooperative. Speech normal rate and rhythm. Affect is appropriate.  Insight and judgement are appropriate. Attention is focused, linear, and appropriate.  NEURO: Oriented as arrived to appointment on time with no prompting.   Attestation Statements:    This was prepared with the assistance of Engineer, civil (consulting).  Occasional wrong-word or sound-a-like substitutions may have occurred due to the inherent limitations of voice recognition software.   Robin Capra, DO

## 2023-04-16 ENCOUNTER — Encounter: Payer: Self-pay | Admitting: Physical Therapy

## 2023-04-16 ENCOUNTER — Ambulatory Visit: Payer: MEDICAID | Admitting: Physical Therapy

## 2023-04-16 DIAGNOSIS — R2689 Other abnormalities of gait and mobility: Secondary | ICD-10-CM | POA: Diagnosis not present

## 2023-04-16 DIAGNOSIS — M6281 Muscle weakness (generalized): Secondary | ICD-10-CM

## 2023-04-16 DIAGNOSIS — M6249 Contracture of muscle, multiple sites: Secondary | ICD-10-CM

## 2023-04-16 DIAGNOSIS — R262 Difficulty in walking, not elsewhere classified: Secondary | ICD-10-CM

## 2023-04-16 NOTE — Therapy (Signed)
OUTPATIENT PHYSICAL THERAPY TREATMENT   Patient Name: Robin [name] MRN: [id number] DOB:Jul 16, 1956, 67 y.o., female Today's Date: 04/16/2023  END OF SESSION:  PT End of Session - 04/16/23 1108     Visit Number 6    Date for PT Re-Evaluation 05/17/23    Authorization Type [employer]    Authorization Time Period 04/14/23 - 05/25/23    Authorization - Visit Number 2    Authorization - Number of Visits 12    PT Start Time 1108    PT Stop Time 1157    PT Time Calculation (min) 49 min    Activity Tolerance Patient tolerated treatment well    Behavior During Therapy [facility] for tasks assessed/performed              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   Anemia    Anxiety disorder    Arthritis    Asthma    Back pain    Chronic neck pain    Colon polyps    Constipation    Depression    Diabetes mellitus without complication (HCC)    Dyspnea    Edema of lower extremity    Fibromyalgia    GERD (gastroesophageal reflux disease)    Heartburn    Hiatal hernia    small   Hyperlipidemia    Hypertension    Hypothyroidism    Joint pain    Morbid obesity with BMI of 50.0-59.9, adult (HCC)    Osteoarthritis    Other fatigue    Schatzki's ring    non critical   Severe episode of recurrent major depressive disorder, with psychotic features (HCC) 05/26/2018   Sinusitis    Sleep apnea    CPAP, Sleep study at [facility]   SOB (shortness of breath) on exertion    Status post dilation of esophageal narrowing    Swallowing difficulty    Past Surgical History:  Procedure Laterality Date   ABDOMINAL HYSTERECTOMY  02/18/2000   CARDIAC CATHETERIZATION  08/18/2003   normal L main/LAD/L Cfx/RCA (Dr. Erlene Quan)   COLONOSCOPY  2006   Dr. Franky Macho, hyperplastic polyps   COLONOSCOPY  08/06/2011   abnormal terminal ileum for 10cm, erosions, geographical ulceration. Bx small bowel  mucosa with prominent intramucosal lymphoid aggregates, slightly inflammed   COLONOSCOPY WITH PROPOFOL N/A 12/28/2019   Rourk: 4 sessile serrated adenomas removed on the colon.  Next colonoscopy in 3 years.   DIRECT LARYNGOSCOPY  03/15/2012   Procedure: DIRECT LARYNGOSCOPY;  Surgeon: Flo Shanks, MD;  Location: [facility] OR;  Service: ENT;  Laterality: N/A; Dr. Wolicki--:>no foreign body seen. normal esophagus to 40cm   ESOPHAGEAL DILATION  04/17/2015   Procedure: ESOPHAGEAL DILATION;  Surgeon: Corbin Ade, MD;  Location: [facility];  Service: Endoscopy;;   ESOPHAGOGASTRODUODENOSCOPY  08/23/2008   WUJ:WJXB distal esophageal erosions consistent with mild erosive reflux esophagitis, otherwise unremarkable esophagus/ Tiny antral erosions of doubtful clinical significance, otherwise normal stomach, patent pylorus, normal D1 and D2   ESOPHAGOGASTRODUODENOSCOPY  08/06/2011   small hh, noncritical Schatzki's ring s/p 58 F   ESOPHAGOGASTRODUODENOSCOPY N/A 04/17/2015   JYN:WGNFAO s/p dilation   ESOPHAGOGASTRODUODENOSCOPY (EGD) WITH PROPOFOL N/A 01/20/2018   Dr. Jena Gauss: Erosive gastropathy, normal-appearing esophagus status post empiric dilation.  Chronic gastritis, no H. pylori.   ESOPHAGOSCOPY  03/15/2012   Procedure: ESOPHAGOSCOPY;  Surgeon: Flo Shanks, MD;  Location: [facility] OR;  Service: ENT;  Laterality: N/A;   KNEE ARTHROSCOPY Left 04/27/2001   MALONEY DILATION N/A 01/20/2018   Procedure: Elease Hashimoto DILATION;  Surgeon: Corbin Ade, MD;  Location: [facility];  Service: Endoscopy;  Laterality: N/A;   NM MYOCAR PERF WALL MOTION  2003   negative bruce protocol exercise tress test; EF 68%; intermediate risk study due to evidence of anterior wall ischemia extending from mid-ventricle to apex   PITUITARY SURGERY  06/2012   benign tumor, surgeon at [facility]   POLYPECTOMY  12/28/2019   Procedure: POLYPECTOMY;  Surgeon: Corbin Ade, MD;  Location: [facility];  Service: Endoscopy;;   RECTOCELE  REPAIR     TRANSTHORACIC ECHOCARDIOGRAM  2003   EF normal   VAGINAL PROLAPSE REPAIR     VIDEO BRONCHOSCOPY Bilateral 03/08/2017   Procedure: VIDEO BRONCHOSCOPY WITHOUT FLUORO;  Surgeon: Roslynn Amble, MD;  Location: [facility] ENDOSCOPY;  Service: Cardiopulmonary;  Laterality: Bilateral;   Patient Active Problem List   Diagnosis Date Noted   Type 2 diabetes mellitus with obesity (HCC) 02/04/2023   Moderate major depression (HCC) 11/12/2022   BMI 40.0-44.9, adult (HCC) 11/03/2022   B12 deficiency 10/06/2022   Depression 07/09/2022   Class 3 severe obesity with serious comorbidity and body mass index (BMI) of 45.0 to 49.9 in adult (HCC) 07/09/2022   Major depressive disorder, recurrent episode (HCC) 07/02/2022   Generalized anxiety disorder 07/02/2022   Body mass index 40.0-44.9, adult (HCC) 02/25/2022   Morbid obesity (HCC) 02/25/2022   Right knee pain 02/18/2022   Diabetes mellitus type 2 in obese 02/18/2022   Lumbar foraminal stenosis 01/23/2022   Lumbar radiculopathy 01/23/2022   Pain in both knees 01/23/2022   Vitamin D deficiency 11/18/2021   Polyp of descending colon 02/15/2020   Pituitary tumor 08/21/2019   Primary osteoarthritis of both hips 08/21/2019   Primary osteoarthritis of both knees 08/21/2019   Pain in left shoulder 08/21/2019   Morbid obesity with BMI of 60.0-69.9, adult (HCC) 07/17/2019   Pedal edema 07/17/2019   Multilevel degenerative disc disease 10/31/2018   Change in bowel function 10/28/2018   Gastritis and gastroduodenitis 04/26/2018   Atrophic vaginitis 04/01/2018   Cough, persistent 02/08/2018   Dysphagia 12/14/2017   Generalized OA 07/30/2017   Urinary incontinence 07/23/2017   Early satiety 06/16/2017   Hx of iron deficiency anemia 05/28/2017   Throat pain in adult 04/05/2017   Prediabetes 02/04/2017   Dyspnea on exertion 12/23/2016   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 12/23/2016   Hemoptysis  12/23/2016   Fibromyalgia 08/18/2016   Chronic lymphocytic thyroiditis 08/18/2016   Persistent depressive disorder 04/01/2016   OSA (obstructive sleep apnea) 04/01/2016   Essential hypertension 12/09/2015   Hypothyroidism 12/09/2015   Morbid obesity due to excess calories (HCC) 12/09/2015   Constipation 05/27/2015   Fatty liver 05/27/2015   Abdominal pain, chronic, epigastric 07/04/2012   History of gastroesophageal reflux (GERD) 06/09/2012   Hyperlipidemia 06/09/2012   Refusal of blood transfusions as patient is Jehovah's Witness 06/09/2012   Anxiety 06/09/2012   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   Abnormal small bowel biopsy 09/18/2011   Lactose intolerance 09/18/2011   GERD 01/21/2010    PCP: Marcine Matar, MD   REFERRING PROVIDER: Marcine Matar, MD   REFERRING DIAG: R26.89 (ICD-10-CM) - Decreased mobility   THERAPY DIAG:  Other abnormalities of gait and mobility  Contracture of muscle, multiple sites  Muscle weakness (generalized)  Difficulty in walking, not elsewhere classified  RATIONALE FOR  EVALUATION AND TREATMENT: Rehabilitation  ONSET DATE: 10+ years  NEXT MD VISIT: 07/15/23   SUBJECTIVE:   SUBJECTIVE STATEMENT: HEP going well but still feels stiff because of the air (her husband likes the house cold).  PAIN: Are you having pain? Yes: NPRS scale:  7/10 Pain location: B knees (L>R) Pain description: sharp, achy Aggravating factors: straightening her knees, standing and walking Relieving factors: exercise, ice, heat, pain meds  Are you having pain? Yes: NPRS scale:  7/10 Pain location: B hips (R>L) Pain description: sharp, achy Aggravating factors: moving in the bed Relieving factors: exercise, ice, heat, pain meds  Bil shoulders 5/10 pain  PERTINENT HISTORY: HTN, DM, OSA, fibromyalgia, depression, prediabetes, DOE, obesity, GERD, hypothyroidism, OA of multiple joints (neck, shoulders, elbows, hands, back, hips, knees), multilevel  DDD, lumbar radiculopathy, OSA, pituitary adenoma status post resection   PRECAUTIONS: Fall  HAND DOMINANCE: Right  WEIGHT BEARING RESTRICTIONS: No  FALLS:  Has patient fallen in last 6 months? No  LIVING ENVIRONMENT: Lives with: lives with their spouse Lives in: House/apartment Stairs: Yes: External: 3 steps; ramp available Has following equipment at home: Counselling psychologist, Environmental consultant - 2 wheeled, Wheelchair (power), shower chair, bed side commode, Grab bars, and Ramped entry  OCCUPATION: Retired  PLOF: Independent with basic ADLs, Independent with household mobility with device, Requires assistive device for independence, Needs assistance with homemaking, and Leisure: sedentary  PATIENT GOALS: "I want to get mobile enough to have TKR surgery."   OBJECTIVE: (objective measures completed at initial evaluation unless otherwise dated)  DIAGNOSTIC FINDINGS:  02/25/22 - B knee x-rays:  Advanced tricompartmental degenerative joint disease. Bone-on-bone joint space narrowing. Varus deformity.  PATIENT SURVEYS:  ABC scale 260 / 1600 = 16.3 % LEFS 38 / 80 = 47.5 %  COGNITION: Overall cognitive status: Within functional limits for tasks assessed     SENSATION: WFL  MUSCLE LENGTH: Hamstrings: severely tight B ITB: NT Piriformis: mod/severe tight B Hip flexors: severely tight B Quads: mod/severe tight B Heelcord: NT  POSTURE:  flexed trunk  and severe crouch in standing d/t hip and knee flexion contractures  LOWER EXTREMITY ROM:  Active & Passive ROM Right eval Left eval Right 03/31/23 Left 03/31/23  Hip flexion      Hip extension -30 -30 -21 from neutral reclined position on white bolster -23 from neutral reclined position on white bolster  Hip abduction      Hip adduction      Hip internal rotation      Hip external rotation      Knee flexion 120 112    Knee extension -31 -43 -27 -38  Ankle dorsiflexion      Ankle plantarflexion      Ankle inversion      Ankle  eversion       (Blank rows = not tested)  LOWER EXTREMITY MMT:  MMT Right eval Left eval  Hip flexion 4+ 4+  Hip extension 4 for available ROM 4 for available ROM  Hip abduction 4- 4-  Hip adduction 4 4  Hip internal rotation 4+ 4+  Hip external rotation 4 4-  Knee flexion 4+ 4+  Knee extension 4+ for available ROM 4+ for available ROM  Ankle dorsiflexion 4+ 4+  Ankle plantarflexion    Ankle inversion    Ankle eversion     (Blank rows = not tested)  FUNCTIONAL TESTS: (03/24/23) 5 times sit to stand: 21.78 sec w/o UE assist (unable to achieve full upright posture due  to hip and knee flexion contractures) Timed up and go (TUG): 47.71 sec with RW 10 meter walk test: 30.31 sec with RW; Gait speed = 1.08 ft/sec with RW  BED MOBILITY:  Sit to supine SBA Supine to sit Mod A Rolling to Right Min A Rolling to Left Min A  TRANSFERS: Assistive device utilized: Environmental consultant - 2 wheeled  Sit to stand: SBA Stand to sit: SBA Chair to chair: SBA Floor:  NT  GAIT: Distance walked: 3-4 steps from power wc to treatment table Assistive device utilized: Environmental consultant - 2 wheeled Level of assistance: CGA Gait pattern: step to pattern, decreased stride length, knee flexed in stance- Right, knee flexed in stance- Left, shuffling, antalgic, and trunk flexed Comments: severe crouch in standing/with gait d/t B hip and knee flexion contractures  RAMP: Level of Assistance:  NT Assistive device utilized:  NT Ramp Comments:   CURB:  Level of Assistance:  NT Assistive device utilized:  NT Curb Comments:   STAIRS:  Level of Assistance:  NT  Stair Negotiation Technique:   Number of Stairs:    Height of Stairs:   Comments:    TODAY'S TREATMENT:  04/16/23 THERAPEUTIC EXERCISE: to improve flexibility, strength and mobility.  Demonstration, verbal and tactile cues throughout for technique.  NuStep - L5 x 7 min with seat back enough to get knee/hip ext stretch Prone lying over 2 pillows with feet off  edge of mat table for hip flexor and prone hang knee extension stretch x 5 min - intermittent knee flexion for break from stretches Hooklying R/L leg lengtheners with single heel on purple FR x 20 bil Hooklying B hip and knee flexion and extension with feet on peanut ball pausing for hip extension isometric into peanut ball at end ROM hip/knee extension Bridge 10 x 3" Bridge + RTB hip ABD isometric 10 x 3" Hooklying alt RTB hip ADB/ER bent-knee fallout 10 x 3"  GAIT TRAINING: To normalize gait pattern and improve safety with RW . Gait with RW x 55' and CGA/SBA of PT via gait belt, following with power w/c    04/14/23 THERAPEUTIC EXERCISE: to improve flexibility, strength and mobility.  Demonstration, verbal and tactile cues throughout for technique. Nustep L4x56min with seat back enough to get knee/hip ext stretch Self- STM with small roller x 1 min ea prior to stretches to help with discomfort and increase blood flow. Seated LAQ 2 x 10 R/L Seated soleus stretch B 2x30 sec Seated gastroc/HS stretch combo with gait belt x2 30 sec B Standing 4 x 1 min at TM with cues to engage glutes and quads Standing heel raise at TM x 10 GAIT with RW CGA with gait belt 4 trials: 2x 20, 1 x 40, 1 x 30 Encouraged pt to use lift chair to help with hip flexor stretch by lowering legs; also using towel roll under ankles while in recliner for prolonged HS stretch.   03/31/23 THERAPEUTIC EXERCISE: to improve flexibility, strength and mobility.  Demonstration, verbal and tactile cues throughout for technique. Nustep L4x62min with seat back enough to get knee/hip ext stretch Self- STM with small roller x 1 min ea prior to stretches to help with discomfort and increase blood flow. Seated LAQ 2 x 10 R/L Seated heel/toe raises x 10  Seated gastroc/HS stretch combo with gait belt x2 30 sec B Reclined hip flexor stretch onto white bolster + blue bolster for head rest x 2 min Seated hamstring stretch hip hinge x 30 sec  B Gait  44 ft with RW CGA with gait belt Discussed pt standing at counter at home and doing active trunk extension and glute squeeze for a dynamic hip flexor stretch   PATIENT EDUCATION:  Education details: initial HEP and positioning in bed to promote hip and knee extension Person educated: Patient Education method: Explanation, Demonstration, Tactile cues, Verbal cues, and Handouts Education comprehension: verbalized understanding, returned demonstration, verbal cues required, tactile cues required, and needs further education  HOME EXERCISE PROGRAM: Access Code: [facility] URL: [email or web address] Date: 04/16/2023 Prepared by: Glenetta Hew  Exercises - Seated Hamstring Stretch  - 2-3 x daily - 7 x weekly - 3 reps - 30 sec hold - Seated Gastroc Stretch with Strap  - 2-3 x daily - 7 x weekly - 3 reps - 30 sec hold - Single Knee to Chest Stretch  - 2-3 x daily - 7 x weekly - 3 reps - 30 sec hold - Seated Long Arc Quad  - 1 x daily - 7 x weekly - 2 sets - 10 reps - Wide Stance with Counter Support  - 1 x daily - 7 x weekly - 1 sets - 5 reps - 1 min hold - Supine Knee Extension Stretch on Towel Roll  - 1 x daily - 7 x weekly - 1 sets - 3 reps - 60 sec  hold - Prone Knee Extension Hang  - 2 x daily - 7 x weekly - 3-5 min hold - Supine Bridge with Resistance Band  - 1 x daily - 7 x weekly - 2 sets - 10 reps - 5 sec hold - Hooklying Single Leg Bent Knee Fallouts with Resistance  - 1 x daily - 7 x weekly - 2 sets - 10 reps - 3 sec hold  ASSESSMENT:  CLINICAL IMPRESSION: Zuriana reports HEP going well and denies need for review of any exercises - STG #1 met. We progressed static stretching to prone lying over pillows today with pt to try and duplicate this at home. Continue focus on active hip and knee extension ROM and strengthening as well as lateral hip strengthening with HEP updated to reflect exercise progression. Isai will continue to benefit from skilled PT to address  ongoing ROM, strength and mobility/gait deficits to improve mobility and activity tolerance with decreased pain interference.   OBJECTIVE IMPAIRMENTS: Abnormal gait, decreased activity tolerance, decreased balance, decreased endurance, decreased knowledge of condition, decreased knowledge of use of DME, decreased mobility, difficulty walking, decreased ROM, decreased strength, decreased safety awareness, hypomobility, increased fascial restrictions, impaired perceived functional ability, increased muscle spasms, impaired flexibility, improper body mechanics, postural dysfunction, and pain.   ACTIVITY LIMITATIONS: carrying, lifting, standing, squatting, sleeping, stairs, transfers, bed mobility, locomotion level, and caring for others  PARTICIPATION LIMITATIONS: meal prep, cleaning, laundry, driving, shopping, community activity, and yard work  PERSONAL FACTORS: Education, Financial risk analyst, Past/current experiences, Time since onset of injury/illness/exacerbation, and 3+ comorbidities: HTN, DM, OSA, fibromyalgia, depression, prediabetes, DOE, obesity, GERD, hypothyroidism, OA of multiple joints (neck, shoulders, elbows, hands, back, hips, knees), multilevel DDD, lumbar radiculopathy, OSA, pituitary adenoma status post resection  are also affecting patient's functional outcome.   REHAB POTENTIAL: Fair due to severity of contractures and prolonged limited mobility over several years  CLINICAL DECISION MAKING: Evolving/moderate complexity  EVALUATION COMPLEXITY: Moderate   GOALS: Goals reviewed with patient? Yes  SHORT TERM GOALS: Target date: 04/19/2023   Patient will be independent with initial HEP. Baseline: Formal HEP to be established next visit.  Education provided on positional stretching for  hip and knee extension in bed on eval. Goal status: MET  04/16/23  2.  Patient will demonstrate >/= 5 degree reduction in B hip and knee flexion contractures. Baseline: Refer to above LE ROM table. Goal  status: PARTIALLY MET  03/31/23 - Met except R knee flexion contracture only improved by 4  3.  Patient will be able to ambulate 30+ ft with RW and SBA of PT, demonstrating improved upright posture for more normal gait pattern to increase functional ambulation in home.  Baseline: 3-4 steps with RW in severe crouch with CGA of PT Goal status: MET  03/31/23  LONG TERM GOALS: Target date: 05/17/2023   Patient will be independent with advanced/ongoing HEP to improve outcomes and carryover.  Baseline:  Goal status: IN PROGRESS  2.  Patient will report at least >/= 50% improvement in B hip and knee pain to improve QOL. Baseline: 7/10 Goal status: IN PROGRESS  3.  Patient will demonstrate improved B hip and knee AROM to [facility] to allow for normal stance and gait mechanics. Baseline: Refer to above LE ROM table. Goal status: IN PROGRESS  4.  Patient will demonstrate improved B LE strength to >/= 4+/5 for improved stability and ease of mobility. Baseline: Refer to above LE MMT table Goal status: IN PROGRESS  5.  Patient will be able to ambulate 50-75 ft with RW or LRAD and improved upright posture for more normal gait pattern to increase functional ambulation in home.  Baseline: 3-4 steps with RW in severe crouch due to B hip and knee flexion contracture Goal status: IN PROGRESS  6.  Patient will report >/= 47/80 on LEFS to demonstrate improved functional ability. Baseline: 38 / 80 = 47.5 % Goal status: IN PROGRESS  7.  Patient will report >/= 35% on ABC scale to demonstrate improved functional ability and activity confidence with decreased fear of falling. Baseline: 260 / 1600 = 16.3 % Goal status: IN PROGRESS  8.  Patient will demonstrate decreased TUG time by >/= 10 sec to decrease risk for falls with transitional mobility. Baseline: 47.71 sec with RW (03/24/23) Goal status: IN PROGRESS    PLAN:  PT FREQUENCY: 1-2x/week  PT DURATION: 8 weeks  PLANNED INTERVENTIONS: Therapeutic  exercises, Therapeutic activity, Neuromuscular re-education, Balance training, Gait training, Patient/Family education, Self Care, Joint mobilization, Stair training, Aquatic Therapy, Dry Needling, Electrical stimulation, Cryotherapy, Moist heat, Taping, Vasopneumatic device, Ultrasound, Ionotophoresis 4mg /ml Dexamethasone, Manual therapy, and Re-evaluation  PLAN FOR NEXT SESSION: LE stretching and progress to include LE strengthening - review/update HEP as indicated; MT +/- DN to help reduce muscle tension and improve ROM/reduce muscle contractures   Marry Guan, PT  04/16/2023, 12:30 PM  [facility] [address] [place], [place] [address] [phone]  Fax: [phone]

## 2023-04-19 ENCOUNTER — Ambulatory Visit: Payer: MEDICAID | Admitting: Physical Therapy

## 2023-04-19 DIAGNOSIS — R262 Difficulty in walking, not elsewhere classified: Secondary | ICD-10-CM

## 2023-04-19 DIAGNOSIS — M6249 Contracture of muscle, multiple sites: Secondary | ICD-10-CM

## 2023-04-19 DIAGNOSIS — R2689 Other abnormalities of gait and mobility: Secondary | ICD-10-CM

## 2023-04-19 DIAGNOSIS — M6281 Muscle weakness (generalized): Secondary | ICD-10-CM

## 2023-04-19 NOTE — Therapy (Signed)
OUTPATIENT PHYSICAL THERAPY TREATMENT   Patient Name: Robin [name] MRN: [id number] DOB:Apr 21, 1956, 67 y.o., female Today's Date: 04/19/2023  END OF SESSION:  PT End of Session - 04/19/23 1106     Visit Number 7    Date for PT Re-Evaluation 05/17/23    Authorization Type [employer]    Authorization Time Period 04/14/23 - 05/25/23    Authorization - Visit Number 3    Authorization - Number of Visits 12    PT Start Time 1106    PT Stop Time 1154    PT Time Calculation (min) 48 min    Activity Tolerance Patient tolerated treatment well    Behavior During Therapy [facility] for tasks assessed/performed               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   Anemia    Anxiety disorder    Arthritis    Asthma    Back pain    Chronic neck pain    Colon polyps    Constipation    Depression    Diabetes mellitus without complication (HCC)    Dyspnea    Edema of lower extremity    Fibromyalgia    GERD (gastroesophageal reflux disease)    Heartburn    Hiatal hernia    small   Hyperlipidemia    Hypertension    Hypothyroidism    Joint pain    Morbid obesity with BMI of 50.0-59.9, adult (HCC)    Osteoarthritis    Other fatigue    Schatzki's ring    non critical   Severe episode of recurrent major depressive disorder, with psychotic features (HCC) 05/26/2018   Sinusitis    Sleep apnea    CPAP, Sleep study at [facility]   SOB (shortness of breath) on exertion    Status post dilation of esophageal narrowing    Swallowing difficulty    Past Surgical History:  Procedure Laterality Date   ABDOMINAL HYSTERECTOMY  02/18/2000   CARDIAC CATHETERIZATION  08/18/2003   normal L main/LAD/L Cfx/RCA (Dr. Erlene Quan)   COLONOSCOPY  2006   Dr. Franky Macho, hyperplastic polyps   COLONOSCOPY  08/06/2011   abnormal terminal ileum for 10cm, erosions, geographical ulceration. Bx small bowel  mucosa with prominent intramucosal lymphoid aggregates, slightly inflammed   COLONOSCOPY WITH PROPOFOL N/A 12/28/2019   Rourk: 4 sessile serrated adenomas removed on the colon.  Next colonoscopy in 3 years.   DIRECT LARYNGOSCOPY  03/15/2012   Procedure: DIRECT LARYNGOSCOPY;  Surgeon: Flo Shanks, MD;  Location: [facility] OR;  Service: ENT;  Laterality: N/A; Dr. Wolicki--:>no foreign body seen. normal esophagus to 40cm   ESOPHAGEAL DILATION  04/17/2015   Procedure: ESOPHAGEAL DILATION;  Surgeon: Corbin Ade, MD;  [facility];  Service: Endoscopy;;   ESOPHAGOGASTRODUODENOSCOPY  08/23/2008   YQI:HKVQ distal esophageal erosions consistent with mild erosive reflux esophagitis, otherwise unremarkable esophagus/ Tiny antral erosions of doubtful clinical significance, otherwise normal stomach, patent pylorus, normal D1 and D2   ESOPHAGOGASTRODUODENOSCOPY  08/06/2011   small hh, noncritical Schatzki's ring s/p 58 F   ESOPHAGOGASTRODUODENOSCOPY N/A 04/17/2015   QVZ:DGLOVF s/p dilation   ESOPHAGOGASTRODUODENOSCOPY (EGD) WITH PROPOFOL N/A 01/20/2018   Dr. Jena Gauss: Erosive gastropathy, normal-appearing esophagus status post empiric dilation.  Chronic gastritis, no H. pylori.   ESOPHAGOSCOPY  03/15/2012   Procedure: ESOPHAGOSCOPY;  Surgeon: Flo Shanks, MD;  Location: [facility] OR;  Service:  ENT;  Laterality: N/A;   KNEE ARTHROSCOPY Left 04/27/2001   MALONEY DILATION N/A 01/20/2018   Procedure: Elease Hashimoto DILATION;  Surgeon: Corbin Ade, MD;  Location: [facility];  Service: Endoscopy;  Laterality: N/A;   NM MYOCAR PERF WALL MOTION  2003   negative bruce protocol exercise tress test; EF 68%; intermediate risk study due to evidence of anterior wall ischemia extending from mid-ventricle to apex   PITUITARY SURGERY  06/2012   benign tumor, surgeon at [facility]   POLYPECTOMY  12/28/2019   Procedure: POLYPECTOMY;  Surgeon: Corbin Ade, MD;  Location: [facility];  Service: Endoscopy;;   RECTOCELE  REPAIR     TRANSTHORACIC ECHOCARDIOGRAM  2003   EF normal   VAGINAL PROLAPSE REPAIR     VIDEO BRONCHOSCOPY Bilateral 03/08/2017   Procedure: VIDEO BRONCHOSCOPY WITHOUT FLUORO;  Surgeon: Roslynn Amble, MD;  Location: [facility] ENDOSCOPY;  Service: Cardiopulmonary;  Laterality: Bilateral;   Patient Active Problem List   Diagnosis Date Noted   Type 2 diabetes mellitus with obesity (HCC) 02/04/2023   Moderate major depression (HCC) 11/12/2022   BMI 40.0-44.9, adult (HCC) 11/03/2022   B12 deficiency 10/06/2022   Depression 07/09/2022   Class 3 severe obesity with serious comorbidity and body mass index (BMI) of 45.0 to 49.9 in adult (HCC) 07/09/2022   Major depressive disorder, recurrent episode (HCC) 07/02/2022   Generalized anxiety disorder 07/02/2022   Body mass index 40.0-44.9, adult (HCC) 02/25/2022   Morbid obesity (HCC) 02/25/2022   Right knee pain 02/18/2022   Diabetes mellitus type 2 in obese 02/18/2022   Lumbar foraminal stenosis 01/23/2022   Lumbar radiculopathy 01/23/2022   Pain in both knees 01/23/2022   Vitamin D deficiency 11/18/2021   Polyp of descending colon 02/15/2020   Pituitary tumor 08/21/2019   Primary osteoarthritis of both hips 08/21/2019   Primary osteoarthritis of both knees 08/21/2019   Pain in left shoulder 08/21/2019   Morbid obesity with BMI of 60.0-69.9, adult (HCC) 07/17/2019   Pedal edema 07/17/2019   Multilevel degenerative disc disease 10/31/2018   Change in bowel function 10/28/2018   Gastritis and gastroduodenitis 04/26/2018   Atrophic vaginitis 04/01/2018   Cough, persistent 02/08/2018   Dysphagia 12/14/2017   Generalized OA 07/30/2017   Urinary incontinence 07/23/2017   Early satiety 06/16/2017   Hx of iron deficiency anemia 05/28/2017   Throat pain in adult 04/05/2017   Prediabetes 02/04/2017   Dyspnea on exertion 12/23/2016   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 12/23/2016   Hemoptysis  12/23/2016   Fibromyalgia 08/18/2016   Chronic lymphocytic thyroiditis 08/18/2016   Persistent depressive disorder 04/01/2016   OSA (obstructive sleep apnea) 04/01/2016   Essential hypertension 12/09/2015   Hypothyroidism 12/09/2015   Morbid obesity due to excess calories (HCC) 12/09/2015   Constipation 05/27/2015   Fatty liver 05/27/2015   Abdominal pain, chronic, epigastric 07/04/2012   History of gastroesophageal reflux (GERD) 06/09/2012   Hyperlipidemia 06/09/2012   Refusal of blood transfusions as patient is Jehovah's Witness 06/09/2012   Anxiety 06/09/2012   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   Abnormal small bowel biopsy 09/18/2011   Lactose intolerance 09/18/2011   GERD 01/21/2010    PCP: Marcine Matar, MD   REFERRING PROVIDER: Marcine Matar, MD   REFERRING DIAG: R26.89 (ICD-10-CM) - Decreased mobility   THERAPY DIAG:  Other abnormalities of gait and mobility  Contracture of muscle, multiple sites  Muscle weakness (generalized)  Difficulty in walking, not elsewhere classified  RATIONALE FOR EVALUATION AND TREATMENT: Rehabilitation  ONSET DATE: 10+ years  NEXT MD VISIT: 07/15/23   SUBJECTIVE:   SUBJECTIVE STATEMENT: Pt reports her R knee is feeling a lot better - less stiff/tight like it's loosening up.  PAIN: Are you having pain? Yes: NPRS scale:  5/10 Pain location: L knee Pain description: sharp, achy Aggravating factors: straightening her knees, standing and walking Relieving factors: exercise, ice, heat, pain meds  Are you having pain? Yes: NPRS scale:  5/10 Pain location: B hips (R>L) Pain description: sharp, achy Aggravating factors: moving in the bed Relieving factors: exercise, ice, heat, pain meds  Bil shoulders 5/10 pain  PERTINENT HISTORY: HTN, DM, OSA, fibromyalgia, depression, prediabetes, DOE, obesity, GERD, hypothyroidism, OA of multiple joints (neck, shoulders, elbows, hands, back, hips, knees), multilevel DDD,  lumbar radiculopathy, OSA, pituitary adenoma status post resection   PRECAUTIONS: Fall  HAND DOMINANCE: Right  WEIGHT BEARING RESTRICTIONS: No  FALLS:  Has patient fallen in last 6 months? No  LIVING ENVIRONMENT: Lives with: lives with their spouse Lives in: House/apartment Stairs: Yes: External: 3 steps; ramp available Has following equipment at home: Counselling psychologist, Environmental consultant - 2 wheeled, Wheelchair (power), shower chair, bed side commode, Grab bars, and Ramped entry  OCCUPATION: Retired  PLOF: Independent with basic ADLs, Independent with household mobility with device, Requires assistive device for independence, Needs assistance with homemaking, and Leisure: sedentary  PATIENT GOALS: "I want to get mobile enough to have TKR surgery."   OBJECTIVE: (objective measures completed at initial evaluation unless otherwise dated)  DIAGNOSTIC FINDINGS:  02/25/22 - B knee x-rays:  Advanced tricompartmental degenerative joint disease. Bone-on-bone joint space narrowing. Varus deformity.  PATIENT SURVEYS:  ABC scale 260 / 1600 = 16.3 % LEFS 38 / 80 = 47.5 %  COGNITION: Overall cognitive status: Within functional limits for tasks assessed     SENSATION: WFL  MUSCLE LENGTH: Hamstrings: severely tight B ITB: NT Piriformis: mod/severe tight B Hip flexors: severely tight B Quads: mod/severe tight B Heelcord: NT  POSTURE:  flexed trunk  and severe crouch in standing d/t hip and knee flexion contractures  LOWER EXTREMITY ROM:  Active & Passive ROM Right eval Left eval Right 03/31/23 Left 03/31/23 Right 04/19/23 Left 04/19/23  Hip flexion        Hip extension -30 -30 -21 from neutral reclined position on white bolster -23 from neutral reclined position on white bolster    Hip abduction        Hip adduction        Hip internal rotation        Hip external rotation        Knee flexion 120 112      Knee extension -31 -43 -27 -38 -23 -38  Ankle dorsiflexion        Ankle  plantarflexion        Ankle inversion        Ankle eversion         (Blank rows = not tested)  LOWER EXTREMITY MMT:  MMT Right eval Left eval  Hip flexion 4+ 4+  Hip extension 4 for available ROM 4 for available ROM  Hip abduction 4- 4-  Hip adduction 4 4  Hip internal rotation 4+ 4+  Hip external rotation 4 4-  Knee flexion 4+ 4+  Knee extension 4+ for available ROM 4+ for available ROM  Ankle dorsiflexion 4+ 4+  Ankle plantarflexion    Ankle inversion    Ankle eversion     (  Blank rows = not tested)  FUNCTIONAL TESTS: (03/24/23) 5 times sit to stand: 21.78 sec w/o UE assist (unable to achieve full upright posture due to hip and knee flexion contractures) Timed up and go (TUG): 47.71 sec with RW 10 meter walk test: 30.31 sec with RW; Gait speed = 1.08 ft/sec with RW  BED MOBILITY:  Sit to supine SBA Supine to sit Mod A Rolling to Right Min A Rolling to Left Min A  TRANSFERS: Assistive device utilized: Environmental consultant - 2 wheeled  Sit to stand: SBA Stand to sit: SBA Chair to chair: SBA Floor:  NT  GAIT: Distance walked: 3-4 steps from power wc to treatment table Assistive device utilized: Environmental consultant - 2 wheeled Level of assistance: CGA Gait pattern: step to pattern, decreased stride length, knee flexed in stance- Right, knee flexed in stance- Left, shuffling, antalgic, and trunk flexed Comments: severe crouch in standing/with gait d/t B hip and knee flexion contractures  RAMP: Level of Assistance:  NT Assistive device utilized:  NT Ramp Comments:   CURB:  Level of Assistance:  NT Assistive device utilized:  NT Curb Comments:   STAIRS:  Level of Assistance:  NT  Stair Negotiation Technique:   Number of Stairs:    Height of Stairs:   Comments:    TODAY'S TREATMENT:  04/19/23 THERAPEUTIC EXERCISE: to improve flexibility, strength and mobility.  Demonstration, verbal and tactile cues throughout for technique.  NuStep - L5 x 7 min with seat back enough to get knee/hip  ext stretch Seated Fitter leg press (1 black/2 blue) 2 x 10 each LE Standing hip and knee extension + heel raises x 10, UE support of back of chair  GAIT TRAINING: To normalize gait pattern and improve safety with RW. Gait with RW 90' and 36' with SBA of PT, following with power w/c   MANUAL THERAPY: To promote normalized muscle tension, improved flexibility, improved joint mobility, and increased ROM. Skilled palpation and monitoring of soft tissue during DN Trigger Point Dry-Needling  Treatment instructions: Expect mild to moderate muscle soreness. S/S of pneumothorax if dry needled over a lung field, and to seek immediate medical attention should they occur. Patient verbalized understanding of these instructions and education. Patient Consent Given: Yes Education handout provided: Yes Muscles treated: R distal medial & lateral hamstrings, R proximal lateral hamstrings, R iliacus Electrical stimulation performed: No Parameters: N/A Treatment response/outcome: Twitch Response Elicited and Palpable Increase in Muscle Length STM/DTM, manual TPR and pin & stretch to muscles addressed with DN   04/16/23 THERAPEUTIC EXERCISE: to improve flexibility, strength and mobility.  Demonstration, verbal and tactile cues throughout for technique.  NuStep - L5 x 7 min with seat back enough to get knee/hip ext stretch Prone lying over 2 pillows with feet off edge of mat table for hip flexor and prone hang knee extension stretch x 5 min - intermittent knee flexion for break from stretches Hooklying R/L leg lengtheners with single heel on purple FR x 20 bil Hooklying B hip and knee flexion and extension with feet on peanut ball pausing for hip extension isometric into peanut ball at end ROM hip/knee extension Bridge 10 x 3" Bridge + RTB hip ABD isometric 10 x 3" Hooklying alt RTB hip ADB/ER bent-knee fallout 10 x 3"  GAIT TRAINING: To normalize gait pattern and improve safety with RW . Gait with RW x 55'  and CGA/SBA of PT via gait belt, following with power w/c    04/14/23 THERAPEUTIC EXERCISE: to improve flexibility,  strength and mobility.  Demonstration, verbal and tactile cues throughout for technique. Nustep L4x75min with seat back enough to get knee/hip ext stretch Self- STM with small roller x 1 min ea prior to stretches to help with discomfort and increase blood flow. Seated LAQ 2 x 10 R/L Seated soleus stretch B 2x30 sec Seated gastroc/HS stretch combo with gait belt x2 30 sec B Standing 4 x 1 min at TM with cues to engage glutes and quads Standing heel raise at TM x 10 GAIT with RW CGA with gait belt 4 trials: 2x 20, 1 x 40, 1 x 30 Encouraged pt to use lift chair to help with hip flexor stretch by lowering legs; also using towel roll under ankles while in recliner for prolonged HS stretch.   PATIENT EDUCATION:  Education details: role of DN and DN rational, procedure, outcomes, potential side effects, and recommended post-treatment exercises/activity Person educated: Patient Education method: Explanation and Handouts Education comprehension: verbalized understanding and needs further education  HOME EXERCISE PROGRAM: Access Code: [facility] URL: [email or web address] Date: 04/19/2023 Prepared by: Glenetta Hew  Exercises - Seated Hamstring Stretch  - 2-3 x daily - 7 x weekly - 3 reps - 30 sec hold - Seated Gastroc Stretch with Strap  - 2-3 x daily - 7 x weekly - 3 reps - 30 sec hold - Single Knee to Chest Stretch  - 2-3 x daily - 7 x weekly - 3 reps - 30 sec hold - Seated Long Arc Quad  - 1 x daily - 7 x weekly - 2 sets - 10 reps - Wide Stance with Counter Support  - 1 x daily - 7 x weekly - 1 sets - 5 reps - 1 min hold - Supine Knee Extension Stretch on Towel Roll  - 1 x daily - 7 x weekly - 1 sets - 3 reps - 60 sec  hold - Prone Knee Extension Hang  - 2 x daily - 7 x weekly - 3-5 min hold - Supine Bridge with Resistance Band  - 1 x daily - 7 x weekly - 2 sets -  10 reps - 5 sec hold - Hooklying Single Leg Bent Knee Fallouts with Resistance  - 1 x daily - 7 x weekly - 2 sets - 10 reps - 3 sec hold  Patient Education - Trigger Point Dry Needling   ASSESSMENT:  CLINICAL IMPRESSION: Oria was able to increase her ambulation distance today but continues to to demonstrate pronounced crouched posture during standing and ambulation.  Continued to focus on quad and hip extensor strengthening for improved upright posture, however she continues to note limitation due to muscle pain/tightness in hamstrings and hip flexors.  Discussed possible trial of DN to address abnormal muscle tension/tightness.  After explanation of DN rational, procedures, outcomes and potential side effects, patient verbalized consent to DN treatment in conjunction with manual STM/DTM and TPR to reduce ttp/muscle tension. Muscles treated as indicated above. DN produced normal response with good twitches elicited resulting in palpable reduction in pain/ttp and muscle tension, with patient able to achieve a few further degrees of knee extension on right. Pt educated to expect mild to moderate muscle soreness for up to 24-48 hrs and instructed to continue prescribed HEP and current activity level with pt verbalizing understanding of these instructions.  If benefit noted from DN to R LE, may consider attempting DN to L LE.  OBJECTIVE IMPAIRMENTS: Abnormal gait, decreased activity tolerance, decreased balance, decreased endurance, decreased knowledge of condition, decreased  knowledge of use of DME, decreased mobility, difficulty walking, decreased ROM, decreased strength, decreased safety awareness, hypomobility, increased fascial restrictions, impaired perceived functional ability, increased muscle spasms, impaired flexibility, improper body mechanics, postural dysfunction, and pain.   ACTIVITY LIMITATIONS: carrying, lifting, standing, squatting, sleeping, stairs, transfers, bed mobility, locomotion  level, and caring for others  PARTICIPATION LIMITATIONS: meal prep, cleaning, laundry, driving, shopping, community activity, and yard work  PERSONAL FACTORS: Education, Financial risk analyst, Past/current experiences, Time since onset of injury/illness/exacerbation, and 3+ comorbidities: HTN, DM, OSA, fibromyalgia, depression, prediabetes, DOE, obesity, GERD, hypothyroidism, OA of multiple joints (neck, shoulders, elbows, hands, back, hips, knees), multilevel DDD, lumbar radiculopathy, OSA, pituitary adenoma status post resection  are also affecting patient's functional outcome.   REHAB POTENTIAL: Fair due to severity of contractures and prolonged limited mobility over several years  CLINICAL DECISION MAKING: Evolving/moderate complexity  EVALUATION COMPLEXITY: Moderate   GOALS: Goals reviewed with patient? Yes  SHORT TERM GOALS: Target date: 04/19/2023   Patient will be independent with initial HEP. Baseline: Formal HEP to be established next visit.  Education provided on positional stretching for hip and knee extension in bed on eval. Goal status: MET  04/16/23  2.  Patient will demonstrate >/= 5 degree reduction in B hip and knee flexion contractures. Baseline: Refer to above LE ROM table. Goal status: MET  04/19/23  3.  Patient will be able to ambulate 30+ ft with RW and SBA of PT, demonstrating improved upright posture for more normal gait pattern to increase functional ambulation in home.  Baseline: 3-4 steps with RW in severe crouch with CGA of PT Goal status: MET  03/31/23  LONG TERM GOALS: Target date: 05/17/2023   Patient will be independent with advanced/ongoing HEP to improve outcomes and carryover.  Baseline:  Goal status: IN PROGRESS  2.  Patient will report at least >/= 50% improvement in B hip and knee pain to improve QOL. Baseline: 7/10 Goal status: IN PROGRESS  04/19/23 - Pt reporting significant reduction in R knee pain and stiffness  3.  Patient will demonstrate improved B  hip and knee AROM to [facility] to allow for normal stance and gait mechanics. Baseline: Refer to above LE ROM table. Goal status: IN PROGRESS  4.  Patient will demonstrate improved B LE strength to >/= 4+/5 for improved stability and ease of mobility. Baseline: Refer to above LE MMT table Goal status: IN PROGRESS  5.  Patient will be able to ambulate 50-75 ft with RW or LRAD and improved upright posture for more normal gait pattern to increase functional ambulation in home.  Baseline: 3-4 steps with RW in severe crouch due to B hip and knee flexion contracture Goal status: IN PROGRESS  04/19/23 - Pt able to increase ambulation distance to 90 ft, but still with significant crouched posture  6.  Patient will report >/= 47/80 on LEFS to demonstrate improved functional ability. Baseline: 38 / 80 = 47.5 % Goal status: IN PROGRESS  7.  Patient will report >/= 35% on ABC scale to demonstrate improved functional ability and activity confidence with decreased fear of falling. Baseline: 260 / 1600 = 16.3 % Goal status: IN PROGRESS  8.  Patient will demonstrate decreased TUG time by >/= 10 sec to decrease risk for falls with transitional mobility. Baseline: 47.71 sec with RW (03/24/23) Goal status: IN PROGRESS    PLAN:  PT FREQUENCY: 1-2x/week  PT DURATION: 8 weeks  PLANNED INTERVENTIONS: Therapeutic exercises, Therapeutic activity, Neuromuscular re-education, Balance training, Gait training,  Patient/Family education, Self Care, Joint mobilization, Stair training, Aquatic Therapy, Dry Needling, Electrical stimulation, Cryotherapy, Moist heat, Taping, Vasopneumatic device, Ultrasound, Ionotophoresis 4mg /ml Dexamethasone, Manual therapy, and Re-evaluation  PLAN FOR NEXT SESSION: Assess response to DN ; LE stretching and progress to include LE strengthening - review/update HEP as indicated; MT +/- DN to help reduce muscle tension and improve ROM/reduce muscle contractures   Marry Guan, PT   04/19/2023, 12:15 PM  [facility] [address] [place], [place] [address] [phone]  Fax: [phone]

## 2023-04-27 ENCOUNTER — Other Ambulatory Visit: Payer: Self-pay | Admitting: Gastroenterology

## 2023-04-28 ENCOUNTER — Ambulatory Visit (HOSPITAL_COMMUNITY): Payer: MEDICAID | Admitting: Clinical

## 2023-04-28 DIAGNOSIS — F341 Dysthymic disorder: Secondary | ICD-10-CM | POA: Diagnosis not present

## 2023-04-28 NOTE — Progress Notes (Signed)
   THERAPIST PROGRESS NOTE  Session Time: 45 minutes  Participation Level: Active  Behavioral Response: CasualAlertDepressed  Type of Therapy: Individual Therapy  Treatment Goals addressed: client will identify 3 cognitive patterns and beliefs that support depression  ProgressTowards Goals: Progressing  Interventions: CBT and Supportive  Summary:  Robin [name] is a 67 y.o. female who presents for the scheduled appointment oriented x 5, appropriately dressed, friendly.  Client denied hallucinations or delusions. Client reported on today she has been doing the same.  Client reported she has been depressed being at home.  Client reported she and her husband are constantly arguing about something that he is not thinking about.  Client reported she does not eat any prepared food for him in the house because it does not taste right as if he put something in the food to make her sick.  Client reported she has talked to her second to eldest daughter who lives in [place].  Client reported her daughter tried to talk her into coming to [place].  Client reported she is not too keen on wanting to live there.  Client reported she has not been able to see her eldest daughter who lives here in the state because her husband has been having chronic health issues.  Client reported she is not sure within the of how they will parked their separate ways. Evidence of progress towards goal: Client reported 1 positive of using her religious support persons to help with her depression.   Suicidal/Homicidal: Nowithout intent/plan  Therapist Response:  Therapist began the appointment asking the client how she has been doing since last seen. Therapist used CBT to engage using active listening and positive emotional support towards her thoughts and feelings. Therapist used CBT to engage and asked the client open-ended questions about current severity of her symptoms and give her time to discuss her thoughts about  home life and other stressors. Therapist used CBT to empathize and normalize the clients emotional response compared to the stressors. Therapist used CBT ask the client to identify her progress with frequency of use with coping skills with continued practice in her daily activity.    Therapist assigned the client homework to practice self care.   Plan: Return again in 4 weeks.  Diagnosis: persistent depressive disorder  Collaboration of Care: Patient refused AEB none requested by the client.  Patient/Guardian was advised Release of Information must be obtained prior to any record release in order to collaborate their care with an outside provider. Patient/Guardian was advised if they have not already done so to contact the registration department to sign all necessary forms in order for [name] to release information regarding their care.   Consent: Patient/Guardian gives verbal consent for treatment and assignment of benefits for services provided during this visit. Patient/Guardian expressed understanding and agreed to proceed.   Robin Rhymes [name], LCSW 04/28/2023

## 2023-05-04 ENCOUNTER — Ambulatory Visit: Payer: MEDICAID

## 2023-05-04 DIAGNOSIS — R2689 Other abnormalities of gait and mobility: Secondary | ICD-10-CM

## 2023-05-04 DIAGNOSIS — M6281 Muscle weakness (generalized): Secondary | ICD-10-CM

## 2023-05-04 DIAGNOSIS — R262 Difficulty in walking, not elsewhere classified: Secondary | ICD-10-CM

## 2023-05-04 DIAGNOSIS — M6249 Contracture of muscle, multiple sites: Secondary | ICD-10-CM

## 2023-05-04 NOTE — Therapy (Signed)
OUTPATIENT PHYSICAL THERAPY TREATMENT   Patient Name: Robin [name] MRN: [id number] DOB:1956-09-03, 67 y.o., female Today's Date: 05/04/2023  END OF SESSION:  PT End of Session - 05/04/23 0950     Visit Number 8    Date for PT Re-Evaluation 05/17/23    Authorization Type [employer]    Authorization Time Period 04/14/23 - 05/25/23    Authorization - Visit Number 4    Authorization - Number of Visits 12    PT Start Time 0941    PT Stop Time 1028    PT Time Calculation (min) 47 min    Activity Tolerance Patient tolerated treatment well    Behavior During Therapy [facility] for tasks assessed/performed                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   Anemia    Anxiety disorder    Arthritis    Asthma    Back pain    Chronic neck pain    Colon polyps    Constipation    Depression    Diabetes mellitus without complication (HCC)    Dyspnea    Edema of lower extremity    Fibromyalgia    GERD (gastroesophageal reflux disease)    Heartburn    Hiatal hernia    small   Hyperlipidemia    Hypertension    Hypothyroidism    Joint pain    Morbid obesity with BMI of 50.0-59.9, adult (HCC)    Osteoarthritis    Other fatigue    Schatzki's ring    non critical   Severe episode of recurrent major depressive disorder, with psychotic features (HCC) 05/26/2018   Sinusitis    Sleep apnea    CPAP, Sleep study at [facility]   SOB (shortness of breath) on exertion    Status post dilation of esophageal narrowing    Swallowing difficulty    Past Surgical History:  Procedure Laterality Date   ABDOMINAL HYSTERECTOMY  02/18/2000   CARDIAC CATHETERIZATION  08/18/2003   normal L main/LAD/L Cfx/RCA (Dr. Erlene Quan)   COLONOSCOPY  2006   Dr. Franky Macho, hyperplastic polyps   COLONOSCOPY  08/06/2011   abnormal terminal ileum for 10cm, erosions, geographical ulceration. Bx small bowel  mucosa with prominent intramucosal lymphoid aggregates, slightly inflammed   COLONOSCOPY WITH PROPOFOL N/A 12/28/2019   Rourk: 4 sessile serrated adenomas removed on the colon.  Next colonoscopy in 3 years.   DIRECT LARYNGOSCOPY  03/15/2012   Procedure: DIRECT LARYNGOSCOPY;  Surgeon: Flo Shanks, MD;  Location: [facility] OR;  Service: ENT;  Laterality: N/A; Dr. Wolicki--:>no foreign body seen. normal esophagus to 40cm   ESOPHAGEAL DILATION  04/17/2015   Procedure: ESOPHAGEAL DILATION;  Surgeon: Corbin Ade, MD;  Location: [facility];  Service: Endoscopy;;   ESOPHAGOGASTRODUODENOSCOPY  08/23/2008   WUJ:WJXB distal esophageal erosions consistent with mild erosive reflux esophagitis, otherwise unremarkable esophagus/ Tiny antral erosions of doubtful clinical significance, otherwise normal stomach, patent pylorus, normal D1 and D2   ESOPHAGOGASTRODUODENOSCOPY  08/06/2011   small hh, noncritical Schatzki's ring s/p 58 F   ESOPHAGOGASTRODUODENOSCOPY N/A 04/17/2015   JYN:WGNFAO s/p dilation   ESOPHAGOGASTRODUODENOSCOPY (EGD) WITH PROPOFOL N/A 01/20/2018   Dr. Jena Gauss: Erosive gastropathy, normal-appearing esophagus status post empiric dilation.  Chronic gastritis, no H. pylori.   ESOPHAGOSCOPY  03/15/2012   Procedure: ESOPHAGOSCOPY;  Surgeon: Flo Shanks, MD;  Location: [facility] OR;  Service: ENT;  Laterality: N/A;   KNEE ARTHROSCOPY Left 04/27/2001   MALONEY DILATION N/A 01/20/2018   Procedure: Elease Hashimoto DILATION;  Surgeon: Corbin Ade, MD;  Location: [facility];  Service: Endoscopy;  Laterality: N/A;   NM MYOCAR PERF WALL MOTION  2003   negative bruce protocol exercise tress test; EF 68%; intermediate risk study due to evidence of anterior wall ischemia extending from mid-ventricle to apex   PITUITARY SURGERY  06/2012   benign tumor, surgeon at [facility]   POLYPECTOMY  12/28/2019   Procedure: POLYPECTOMY;  Surgeon: Corbin Ade, MD;  Location: [facility];  Service: Endoscopy;;   RECTOCELE  REPAIR     TRANSTHORACIC ECHOCARDIOGRAM  2003   EF normal   VAGINAL PROLAPSE REPAIR     VIDEO BRONCHOSCOPY Bilateral 03/08/2017   Procedure: VIDEO BRONCHOSCOPY WITHOUT FLUORO;  Surgeon: Roslynn Amble, MD;  Location: [facility] ENDOSCOPY;  Service: Cardiopulmonary;  Laterality: Bilateral;   Patient Active Problem List   Diagnosis Date Noted   Type 2 diabetes mellitus with obesity (HCC) 02/04/2023   Moderate major depression (HCC) 11/12/2022   BMI 40.0-44.9, adult (HCC) 11/03/2022   B12 deficiency 10/06/2022   Depression 07/09/2022   Class 3 severe obesity with serious comorbidity and body mass index (BMI) of 45.0 to 49.9 in adult (HCC) 07/09/2022   Major depressive disorder, recurrent episode (HCC) 07/02/2022   Generalized anxiety disorder 07/02/2022   Body mass index 40.0-44.9, adult (HCC) 02/25/2022   Morbid obesity (HCC) 02/25/2022   Right knee pain 02/18/2022   Diabetes mellitus type 2 in obese 02/18/2022   Lumbar foraminal stenosis 01/23/2022   Lumbar radiculopathy 01/23/2022   Pain in both knees 01/23/2022   Vitamin D deficiency 11/18/2021   Polyp of descending colon 02/15/2020   Pituitary tumor 08/21/2019   Primary osteoarthritis of both hips 08/21/2019   Primary osteoarthritis of both knees 08/21/2019   Pain in left shoulder 08/21/2019   Morbid obesity with BMI of 60.0-69.9, adult (HCC) 07/17/2019   Pedal edema 07/17/2019   Multilevel degenerative disc disease 10/31/2018   Change in bowel function 10/28/2018   Gastritis and gastroduodenitis 04/26/2018   Atrophic vaginitis 04/01/2018   Cough, persistent 02/08/2018   Dysphagia 12/14/2017   Generalized OA 07/30/2017   Urinary incontinence 07/23/2017   Early satiety 06/16/2017   Hx of iron deficiency anemia 05/28/2017   Throat pain in adult 04/05/2017   Prediabetes 02/04/2017   Dyspnea on exertion 12/23/2016   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 12/23/2016   Hemoptysis  12/23/2016   Fibromyalgia 08/18/2016   Chronic lymphocytic thyroiditis 08/18/2016   Persistent depressive disorder 04/01/2016   OSA (obstructive sleep apnea) 04/01/2016   Essential hypertension 12/09/2015   Hypothyroidism 12/09/2015   Morbid obesity due to excess calories (HCC) 12/09/2015   Constipation 05/27/2015   Fatty liver 05/27/2015   Abdominal pain, chronic, epigastric 07/04/2012   History of gastroesophageal reflux (GERD) 06/09/2012   Hyperlipidemia 06/09/2012   Refusal of blood transfusions as patient is Jehovah's Witness 06/09/2012   Anxiety 06/09/2012   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 The L knee has been bothering her more since last visit.   PAIN: Are you having pain? Yes: NPRS scale: 7/10 Pain location: L knee Pain description: sharp, achy Aggravating factors: straightening her knees, standing and walking Relieving factors: exercise, ice, heat, pain meds  Are you having pain? Yes: NPRS scale: 6/10 Pain location: B hips (L>R Pain description: sharp, achy Aggravating factors: moving in the bed Relieving factors: exercise, ice, heat, pain meds  Bil shoulders 5/10 pain  PERTINENT HISTORY: HTN, DM, OSA, fibromyalgia, depression, prediabetes, DOE, obesity, GERD, hypothyroidism, OA of multiple joints (neck, shoulders, elbows, hands, back, hips, knees),  multilevel DDD, lumbar radiculopathy, OSA, pituitary adenoma status post resection   PRECAUTIONS: Fall  HAND DOMINANCE: Right  WEIGHT BEARING RESTRICTIONS: No  FALLS:  Has patient fallen in last 6 months? No  LIVING ENVIRONMENT: Lives with: lives with their spouse Lives in: House/apartment Stairs: Yes: External: 3 steps; ramp available Has following equipment at home: Counselling psychologist, Environmental consultant - 2 wheeled, Wheelchair (power), shower chair, bed side commode, Grab bars, and Ramped entry  OCCUPATION: Retired  PLOF: Independent with basic ADLs, Independent with household mobility with device, Requires assistive device for independence, Needs assistance with homemaking, and Leisure: sedentary  PATIENT GOALS: "I want to get mobile enough to have TKR surgery."   OBJECTIVE: (objective measures completed at initial evaluation unless otherwise dated)  DIAGNOSTIC FINDINGS:  02/25/22 - B knee x-rays:  Advanced tricompartmental degenerative joint disease. Bone-on-bone joint space narrowing. Varus deformity.  PATIENT SURVEYS:  ABC scale 260 / 1600 = 16.3 % LEFS 38 / 80 = 47.5 %  COGNITION: Overall cognitive status: Within functional limits for tasks assessed     SENSATION: WFL  MUSCLE LENGTH: Hamstrings: severely tight B ITB: NT Piriformis: mod/severe tight B Hip flexors: severely tight B Quads: mod/severe tight B Heelcord: NT  POSTURE:  flexed trunk  and severe crouch in standing d/t hip and knee flexion contractures  LOWER EXTREMITY ROM:  Active & Passive ROM Right eval Left eval Right 03/31/23 Left 03/31/23 Right 04/19/23 Left 04/19/23  Hip flexion        Hip extension -30 -30 -21 from neutral reclined position on white bolster -23 from neutral reclined position on white bolster    Hip abduction        Hip adduction        Hip internal rotation        Hip external rotation        Knee flexion 120 112      Knee extension -31 -43 -27 -38 -23 -38  Ankle dorsiflexion         Ankle plantarflexion        Ankle inversion        Ankle eversion         (Blank rows = not tested)  LOWER EXTREMITY MMT:  MMT Right eval Left eval R 05/04/23 L 05/04/23  Hip flexion 4+ 4+ 5 5  Hip extension 4 for available ROM 4 for available ROM 4 4+  Hip abduction 4- 4- 4 4  Hip adduction 4 4 4+ 4+  Hip internal rotation 4+ 4+    Hip external rotation 4 4- 4+ 4  Knee flexion 4+ 4+ 4+ 4+  Knee extension 4+ for available ROM 4+ for available  ROM 4+ 4+  Ankle dorsiflexion 4+ 4+    Ankle plantarflexion      Ankle inversion      Ankle eversion       (Blank rows = not tested)  FUNCTIONAL TESTS: (03/24/23) 5 times sit to stand: 21.78 sec w/o UE assist (unable to achieve full upright posture due to hip and knee flexion contractures) Timed up and go (TUG): 47.71 sec with RW 10 meter walk test: 30.31 sec with RW; Gait speed = 1.08 ft/sec with RW  BED MOBILITY:  Sit to supine SBA Supine to sit Mod A Rolling to Right Min A Rolling to Left Min A  TRANSFERS: Assistive device utilized: Environmental consultant - 2 wheeled  Sit to stand: SBA Stand to sit: SBA Chair to chair: SBA Floor:  NT  GAIT: Distance walked: 3-4 steps from power wc to treatment table Assistive device utilized: Environmental consultant - 2 wheeled Level of assistance: CGA Gait pattern: step to pattern, decreased stride length, knee flexed in stance- Right, knee flexed in stance- Left, shuffling, antalgic, and trunk flexed Comments: severe crouch in standing/with gait d/t B hip and knee flexion contractures  RAMP: Level of Assistance:  NT Assistive device utilized:  NT Ramp Comments:   CURB:  Level of Assistance:  NT Assistive device utilized:  NT Curb Comments:   STAIRS:  Level of Assistance:  NT  Stair Negotiation Technique:   Number of Stairs:    Height of Stairs:   Comments:    TODAY'S TREATMENT: 05/04/23 Therapeutic Exercise: to improve strength and mobility.  Demo, verbal and tactile cues throughout for technique Nustep  L5x57min Standing trunk extension 10x with RW Seated quad sets x 10 with heel propped on stool Standing heel raises x6- pain in L knee afterwards Tested strength in bil hips and knees  Manual Therapy: to decrease muscle spasm, pain and improve mobility STM to L hamstrings and hip adductors in sitting  04/19/23 THERAPEUTIC EXERCISE: to improve flexibility, strength and mobility.  Demonstration, verbal and tactile cues throughout for technique.  NuStep - L5 x 7 min with seat back enough to get knee/hip ext stretch Seated Fitter leg press (1 black/2 blue) 2 x 10 each LE Standing hip and knee extension + heel raises x 10, UE support of back of chair  GAIT TRAINING: To normalize gait pattern and improve safety with RW. Gait with RW 90' and 10' with SBA of PT, following with power w/c   MANUAL THERAPY: To promote normalized muscle tension, improved flexibility, improved joint mobility, and increased ROM. Skilled palpation and monitoring of soft tissue during DN Trigger Point Dry-Needling  Treatment instructions: Expect mild to moderate muscle soreness. S/S of pneumothorax if dry needled over a lung field, and to seek immediate medical attention should they occur. Patient verbalized understanding of these instructions and education. Patient Consent Given: Yes Education handout provided: Yes Muscles treated: R distal medial & lateral hamstrings, R proximal lateral hamstrings, R iliacus Electrical stimulation performed: No Parameters: N/A Treatment response/outcome: Twitch Response Elicited and Palpable Increase in Muscle Length STM/DTM, manual TPR and pin & stretch to muscles addressed with DN   04/16/23 THERAPEUTIC EXERCISE: to improve flexibility, strength and mobility.  Demonstration, verbal and tactile cues throughout for technique.  NuStep - L5 x 7 min with seat back enough to get knee/hip ext stretch Prone lying over 2 pillows with feet off edge of mat table for hip flexor and prone hang  knee extension stretch x 5 min - intermittent knee flexion  for break from stretches Hooklying R/L leg lengtheners with single heel on purple FR x 20 bil Hooklying B hip and knee flexion and extension with feet on peanut ball pausing for hip extension isometric into peanut ball at end ROM hip/knee extension Bridge 10 x 3" Bridge + RTB hip ABD isometric 10 x 3" Hooklying alt RTB hip ADB/ER bent-knee fallout 10 x 3"  GAIT TRAINING: To normalize gait pattern and improve safety with RW . Gait with RW x 55' and CGA/SBA of PT via gait belt, following with power w/c    04/14/23 THERAPEUTIC EXERCISE: to improve flexibility, strength and mobility.  Demonstration, verbal and tactile cues throughout for technique. Nustep L4x59min with seat back enough to get knee/hip ext stretch Self- STM with small roller x 1 min ea prior to stretches to help with discomfort and increase blood flow. Seated LAQ 2 x 10 R/L Seated soleus stretch B 2x30 sec Seated gastroc/HS stretch combo with gait belt x2 30 sec B Standing 4 x 1 min at TM with cues to engage glutes and quads Standing heel raise at TM x 10 GAIT with RW CGA with gait belt 4 trials: 2x 20, 1 x 40, 1 x 30 Encouraged pt to use lift chair to help with hip flexor stretch by lowering legs; also using towel roll under ankles while in recliner for prolonged HS stretch.   PATIENT EDUCATION:  Education details: role of DN and DN rational, procedure, outcomes, potential side effects, and recommended post-treatment exercises/activity Person educated: Patient Education method: Explanation and Handouts Education comprehension: verbalized understanding and needs further education  HOME EXERCISE PROGRAM: Access Code: [facility] URL: [email or web address] Date: 04/19/2023 Prepared by: Glenetta Hew  Exercises - Seated Hamstring Stretch  - 2-3 x daily - 7 x weekly - 3 reps - 30 sec hold - Seated Gastroc Stretch with Strap  - 2-3 x daily - 7 x weekly - 3  reps - 30 sec hold - Single Knee to Chest Stretch  - 2-3 x daily - 7 x weekly - 3 reps - 30 sec hold - Seated Long Arc Quad  - 1 x daily - 7 x weekly - 2 sets - 10 reps - Wide Stance with Counter Support  - 1 x daily - 7 x weekly - 1 sets - 5 reps - 1 min hold - Supine Knee Extension Stretch on Towel Roll  - 1 x daily - 7 x weekly - 1 sets - 3 reps - 60 sec  hold - Prone Knee Extension Hang  - 2 x daily - 7 x weekly - 3-5 min hold - Supine Bridge with Resistance Band  - 1 x daily - 7 x weekly - 2 sets - 10 reps - 5 sec hold - Hooklying Single Leg Bent Knee Fallouts with Resistance  - 1 x daily - 7 x weekly - 2 sets - 10 reps - 3 sec hold  Patient Education - Trigger Point Dry Needling   ASSESSMENT:  CLINICAL IMPRESSION: Pt shows progress with LE strength today, still with most weakness in the glutes. She had many TrPs in the L hamstrings and adductors with improved tightness after MT. She had a fair response to the treatment and would continue to benefit from skilled therapy.   OBJECTIVE IMPAIRMENTS: Abnormal gait, decreased activity tolerance, decreased balance, decreased endurance, decreased knowledge of condition, decreased knowledge of use of DME, decreased mobility, difficulty walking, decreased ROM, decreased strength, decreased safety awareness, hypomobility, increased fascial restrictions, impaired perceived functional  ability, increased muscle spasms, impaired flexibility, improper body mechanics, postural dysfunction, and pain.   ACTIVITY LIMITATIONS: carrying, lifting, standing, squatting, sleeping, stairs, transfers, bed mobility, locomotion level, and caring for others  PARTICIPATION LIMITATIONS: meal prep, cleaning, laundry, driving, shopping, community activity, and yard work  PERSONAL FACTORS: Education, Financial risk analyst, Past/current experiences, Time since onset of injury/illness/exacerbation, and 3+ comorbidities: HTN, DM, OSA, fibromyalgia, depression, prediabetes, DOE, obesity,  GERD, hypothyroidism, OA of multiple joints (neck, shoulders, elbows, hands, back, hips, knees), multilevel DDD, lumbar radiculopathy, OSA, pituitary adenoma status post resection  are also affecting patient's functional outcome.   REHAB POTENTIAL: Fair due to severity of contractures and prolonged limited mobility over several years  CLINICAL DECISION MAKING: Evolving/moderate complexity  EVALUATION COMPLEXITY: Moderate   GOALS: Goals reviewed with patient? Yes  SHORT TERM GOALS: Target date: 04/19/2023   Patient will be independent with initial HEP. Baseline: Formal HEP to be established next visit.  Education provided on positional stretching for hip and knee extension in bed on eval. Goal status: MET  04/16/23  2.  Patient will demonstrate >/= 5 degree reduction in B hip and knee flexion contractures. Baseline: Refer to above LE ROM table. Goal status: MET  04/19/23  3.  Patient will be able to ambulate 30+ ft with RW and SBA of PT, demonstrating improved upright posture for more normal gait pattern to increase functional ambulation in home.  Baseline: 3-4 steps with RW in severe crouch with CGA of PT Goal status: MET  03/31/23  LONG TERM GOALS: Target date: 05/17/2023   Patient will be independent with advanced/ongoing HEP to improve outcomes and carryover.  Baseline:  Goal status: IN PROGRESS  2.  Patient will report at least >/= 50% improvement in B hip and knee pain to improve QOL. Baseline: 7/10 Goal status: IN PROGRESS  04/19/23 - Pt reporting significant reduction in R knee pain and stiffness  3.  Patient will demonstrate improved B hip and knee AROM to [facility] to allow for normal stance and gait mechanics. Baseline: Refer to above LE ROM table. Goal status: IN PROGRESS  4.  Patient will demonstrate improved B LE strength to >/= 4+/5 for improved stability and ease of mobility. Baseline: Refer to above LE MMT table Goal status: IN PROGRESS- 05/04/23  5.  Patient will be  able to ambulate 50-75 ft with RW or LRAD and improved upright posture for more normal gait pattern to increase functional ambulation in home.  Baseline: 3-4 steps with RW in severe crouch due to B hip and knee flexion contracture Goal status: IN PROGRESS  04/19/23 - Pt able to increase ambulation distance to 90 ft, but still with significant crouched posture  6.  Patient will report >/= 47/80 on LEFS to demonstrate improved functional ability. Baseline: 38 / 80 = 47.5 % Goal status: IN PROGRESS  7.  Patient will report >/= 35% on ABC scale to demonstrate improved functional ability and activity confidence with decreased fear of falling. Baseline: 260 / 1600 = 16.3 % Goal status: IN PROGRESS  8.  Patient will demonstrate decreased TUG time by >/= 10 sec to decrease risk for falls with transitional mobility. Baseline: 47.71 sec with RW (03/24/23) Goal status: IN PROGRESS    PLAN:  PT FREQUENCY: 1-2x/week  PT DURATION: 8 weeks  PLANNED INTERVENTIONS: Therapeutic exercises, Therapeutic activity, Neuromuscular re-education, Balance training, Gait training, Patient/Family education, Self Care, Joint mobilization, Stair training, Aquatic Therapy, Dry Needling, Electrical stimulation, Cryotherapy, Moist heat, Taping, Vasopneumatic device, Ultrasound, Ionotophoresis  4mg /ml Dexamethasone, Manual therapy, and Re-evaluation  PLAN FOR NEXT SESSION: DN to L hamstrings and hip adductors; LE stretching and progress to include LE strengthening - review/update HEP as indicated; MT +/- DN to help reduce muscle tension and improve ROM/reduce muscle contractures   Darleene Cleaver, PTA  05/04/2023, 10:38 AM  [facility] [address] [place], [place] [address] [phone]  Fax: [phone]

## 2023-05-06 ENCOUNTER — Encounter: Payer: Self-pay | Admitting: Physical Therapy

## 2023-05-06 ENCOUNTER — Ambulatory Visit: Payer: MEDICAID | Attending: Internal Medicine | Admitting: Physical Therapy

## 2023-05-06 DIAGNOSIS — R262 Difficulty in walking, not elsewhere classified: Secondary | ICD-10-CM | POA: Diagnosis present

## 2023-05-06 DIAGNOSIS — M6281 Muscle weakness (generalized): Secondary | ICD-10-CM | POA: Diagnosis present

## 2023-05-06 DIAGNOSIS — M6249 Contracture of muscle, multiple sites: Secondary | ICD-10-CM | POA: Diagnosis present

## 2023-05-06 DIAGNOSIS — R2689 Other abnormalities of gait and mobility: Secondary | ICD-10-CM | POA: Diagnosis present

## 2023-05-06 NOTE — Therapy (Signed)
OUTPATIENT PHYSICAL THERAPY TREATMENT   Patient Name: Robin [name] MRN: [id number] DOB:[birth year], 67 y.o., female Today's Date: 05/06/2023  END OF SESSION:  PT End of Session - 05/06/23 0940     Visit Number 9    Date for PT Re-Evaluation 05/17/23    Authorization Type [employer]    Authorization Time Period 04/14/23 - 05/25/23    PT Start Time 0933    PT Stop Time 1011    PT Time Calculation (min) 38 min    Activity Tolerance Patient tolerated treatment well    Behavior During Therapy [facility] for tasks assessed/performed                 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   Anemia    Anxiety disorder    Arthritis    Asthma    Back pain    Chronic neck pain    Colon polyps    Constipation    Depression    Diabetes mellitus without complication (HCC)    Dyspnea    Edema of lower extremity    Fibromyalgia    GERD (gastroesophageal reflux disease)    Heartburn    Hiatal hernia    small   Hyperlipidemia    Hypertension    Hypothyroidism    Joint pain    Morbid obesity with BMI of 50.0-59.9, adult (HCC)    Osteoarthritis    Other fatigue    Schatzki's ring    non critical   Severe episode of recurrent major depressive disorder, with psychotic features (HCC) 05/26/2018   Sinusitis    Sleep apnea    CPAP, Sleep study at [facility]   SOB (shortness of breath) on exertion    Status post dilation of esophageal narrowing    Swallowing difficulty    Past Surgical History:  Procedure Laterality Date   ABDOMINAL HYSTERECTOMY  02/18/2000   CARDIAC CATHETERIZATION  08/18/2003   normal L main/LAD/L Cfx/RCA (Dr. Erlene Quan)   COLONOSCOPY  2006   Dr. Franky Macho, hyperplastic polyps   COLONOSCOPY  08/06/2011   abnormal terminal ileum for 10cm, erosions, geographical ulceration. Bx small bowel mucosa with prominent intramucosal lymphoid aggregates, slightly inflammed    COLONOSCOPY WITH PROPOFOL N/A 12/28/2019   Rourk: 4 sessile serrated adenomas removed on the colon.  Next colonoscopy in 3 years.   DIRECT LARYNGOSCOPY  03/15/2012   Procedure: DIRECT LARYNGOSCOPY;  Surgeon: Flo Shanks, MD;  Location: [facility] OR;  Service: ENT;  Laterality: N/A; Dr. Wolicki--:>no foreign body seen. normal esophagus to 40cm   ESOPHAGEAL DILATION  04/17/2015   Procedure: ESOPHAGEAL DILATION;  Surgeon: Corbin Ade, MD;  [facility];  Service: Endoscopy;;   ESOPHAGOGASTRODUODENOSCOPY  08/23/2008   YQM:VHQI distal esophageal erosions consistent with mild erosive reflux esophagitis, otherwise unremarkable esophagus/ Tiny antral erosions of doubtful clinical significance, otherwise normal stomach, patent pylorus, normal D1 and D2   ESOPHAGOGASTRODUODENOSCOPY  08/06/2011   small hh, noncritical Schatzki's ring s/p 58 F   ESOPHAGOGASTRODUODENOSCOPY N/A 04/17/2015   ONG:EXBMWU s/p dilation   ESOPHAGOGASTRODUODENOSCOPY (EGD) WITH PROPOFOL N/A 01/20/2018   Dr. Jena Gauss: Erosive gastropathy, normal-appearing esophagus status post empiric dilation.  Chronic gastritis, no H. pylori.   ESOPHAGOSCOPY  03/15/2012   Procedure: ESOPHAGOSCOPY;  Surgeon: Flo Shanks, MD;  Location: [facility] OR;  Service: ENT;  Laterality: N/A;   KNEE ARTHROSCOPY Left 04/27/2001   MALONEY DILATION N/A  01/20/2018   Procedure: Elease Hashimoto DILATION;  Surgeon: Corbin Ade, MD;  [facility];  Service: Endoscopy;  Laterality: N/A;   NM MYOCAR PERF WALL MOTION  2003   negative bruce protocol exercise tress test; EF 68%; intermediate risk study due to evidence of anterior wall ischemia extending from mid-ventricle to apex   PITUITARY SURGERY  06/2012   benign tumor, surgeon at [facility]   POLYPECTOMY  12/28/2019   Procedure: POLYPECTOMY;  Surgeon: Corbin Ade, MD;  [facility];  Service: Endoscopy;;   RECTOCELE REPAIR     TRANSTHORACIC ECHOCARDIOGRAM  2003   EF normal   VAGINAL  PROLAPSE REPAIR     VIDEO BRONCHOSCOPY Bilateral 03/08/2017   Procedure: VIDEO BRONCHOSCOPY WITHOUT FLUORO;  Surgeon: Roslynn Amble, MD;  Location: [facility] ENDOSCOPY;  Service: Cardiopulmonary;  Laterality: Bilateral;   Patient Active Problem List   Diagnosis Date Noted   Type 2 diabetes mellitus with obesity (HCC) 02/04/2023   Moderate major depression (HCC) 11/12/2022   BMI 40.0-44.9, adult (HCC) 11/03/2022   B12 deficiency 10/06/2022   Depression 07/09/2022   Class 3 severe obesity with serious comorbidity and body mass index (BMI) of 45.0 to 49.9 in adult [facility]) 07/09/2022   Major depressive disorder, recurrent episode (HCC) 07/02/2022   Generalized anxiety disorder 07/02/2022   Body mass index 40.0-44.9, adult (HCC) 02/25/2022   Morbid obesity (HCC) 02/25/2022   Right knee pain 02/18/2022   Diabetes mellitus type 2 in obese 02/18/2022   Lumbar foraminal stenosis 01/23/2022   Lumbar radiculopathy 01/23/2022   Pain in both knees 01/23/2022   Vitamin D deficiency 11/18/2021   Polyp of descending colon 02/15/2020   Pituitary tumor 08/21/2019   Primary osteoarthritis of both hips 08/21/2019   Primary osteoarthritis of both knees 08/21/2019   Pain in left shoulder 08/21/2019   Morbid obesity with BMI of 60.0-69.9, adult (HCC) 07/17/2019   Pedal edema 07/17/2019   Multilevel degenerative disc disease 10/31/2018   Change in bowel function 10/28/2018   Gastritis and gastroduodenitis 04/26/2018   Atrophic vaginitis 04/01/2018   Cough, persistent 02/08/2018   Dysphagia 12/14/2017   Generalized OA 07/30/2017   Urinary incontinence 07/23/2017   Early satiety 06/16/2017   Hx of iron deficiency anemia 05/28/2017   Throat pain in adult 04/05/2017   Prediabetes 02/04/2017   Dyspnea on exertion 12/23/2016   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 12/23/2016   Hemoptysis 12/23/2016   Fibromyalgia 08/18/2016   Chronic lymphocytic thyroiditis  08/18/2016   Persistent depressive disorder 04/01/2016   OSA (obstructive sleep apnea) 04/01/2016   Essential hypertension 12/09/2015   Hypothyroidism 12/09/2015   Morbid obesity due to excess calories (HCC) 12/09/2015   Constipation 05/27/2015   Fatty liver 05/27/2015   Abdominal pain, chronic, epigastric 07/04/2012   History of gastroesophageal reflux (GERD) 06/09/2012   Hyperlipidemia 06/09/2012   Refusal of blood transfusions as patient is Jehovah's Witness 06/09/2012   Anxiety 06/09/2012   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   Abnormal small bowel biopsy 09/18/2011   Lactose intolerance 09/18/2011   GERD 01/21/2010    PCP: Marcine Matar, MD   REFERRING PROVIDER: Marcine Matar, MD   REFERRING DIAG: R26.89 (ICD-10-CM) - Decreased mobility   THERAPY DIAG:  Other abnormalities of gait and mobility  Contracture of muscle, multiple sites  Muscle weakness (generalized)  Difficulty in walking, not elsewhere classified  RATIONALE FOR EVALUATION AND TREATMENT: Rehabilitation  ONSET DATE: 10+ years  NEXT MD VISIT:  07/15/23   SUBJECTIVE:   SUBJECTIVE STATEMENT:  Left knee is still bothering me, not a whole lot helps when its flared up. Hips are better today.  Shoulders are a little better today.   PAIN: Are you having pain? Yes: NPRS scale: 7/10 Pain location: L knee Pain description: sharp, achy Aggravating factors: straightening her knees, standing and walking Relieving factors: exercise, ice, heat, pain meds  Are you having pain? Yes: NPRS scale: 6/10 Pain location: B hips (L>R Pain description: sharp, achy Aggravating factors: moving in the bed Relieving factors: exercise, ice, heat, pain meds  Bil shoulders 5/10 pain  PERTINENT HISTORY: HTN, DM, OSA, fibromyalgia, depression, prediabetes, DOE, obesity, GERD, hypothyroidism, OA of multiple joints (neck, shoulders, elbows, hands, back, hips, knees), multilevel DDD, lumbar radiculopathy, OSA,  pituitary adenoma status post resection   PRECAUTIONS: Fall  HAND DOMINANCE: Right  WEIGHT BEARING RESTRICTIONS: No  FALLS:  Has patient fallen in last 6 months? No  LIVING ENVIRONMENT: Lives with: lives with their spouse Lives in: House/apartment Stairs: Yes: External: 3 steps; ramp available Has following equipment at home: Counselling psychologist, Environmental consultant - 2 wheeled, Wheelchair (power), shower chair, bed side commode, Grab bars, and Ramped entry  OCCUPATION: Retired  PLOF: Independent with basic ADLs, Independent with household mobility with device, Requires assistive device for independence, Needs assistance with homemaking, and Leisure: sedentary  PATIENT GOALS: "I want to get mobile enough to have TKR surgery."   OBJECTIVE: (objective measures completed at initial evaluation unless otherwise dated)  DIAGNOSTIC FINDINGS:  02/25/22 - B knee x-rays:  Advanced tricompartmental degenerative joint disease. Bone-on-bone joint space narrowing. Varus deformity.  PATIENT SURVEYS:  ABC scale 260 / 1600 = 16.3 % LEFS 38 / 80 = 47.5 %  COGNITION: Overall cognitive status: Within functional limits for tasks assessed     SENSATION: WFL  MUSCLE LENGTH: Hamstrings: severely tight B ITB: NT Piriformis: mod/severe tight B Hip flexors: severely tight B Quads: mod/severe tight B Heelcord: NT  POSTURE:  flexed trunk  and severe crouch in standing d/t hip and knee flexion contractures  LOWER EXTREMITY ROM:  Active & Passive ROM Right eval Left eval Right 03/31/23 Left 03/31/23 Right 04/19/23 Left 04/19/23  Hip flexion        Hip extension -30 -30 -21 from neutral reclined position on white bolster -23 from neutral reclined position on white bolster    Hip abduction        Hip adduction        Hip internal rotation        Hip external rotation        Knee flexion 120 112      Knee extension -31 -43 -27 -38 -23 -38  Ankle dorsiflexion        Ankle plantarflexion        Ankle  inversion        Ankle eversion         (Blank rows = not tested)  LOWER EXTREMITY MMT:  MMT Right eval Left eval R 05/04/23 L 05/04/23  Hip flexion 4+ 4+ 5 5  Hip extension 4 for available ROM 4 for available ROM 4 4+  Hip abduction 4- 4- 4 4  Hip adduction 4 4 4+ 4+  Hip internal rotation 4+ 4+    Hip external rotation 4 4- 4+ 4  Knee flexion 4+ 4+ 4+ 4+  Knee extension 4+ for available ROM 4+ for available ROM 4+ 4+  Ankle dorsiflexion 4+ 4+  Ankle plantarflexion      Ankle inversion      Ankle eversion       (Blank rows = not tested)  FUNCTIONAL TESTS: (03/24/23) 5 times sit to stand: 21.78 sec w/o UE assist (unable to achieve full upright posture due to hip and knee flexion contractures) Timed up and go (TUG): 47.71 sec with RW 10 meter walk test: 30.31 sec with RW; Gait speed = 1.08 ft/sec with RW  BED MOBILITY:  Sit to supine SBA Supine to sit Mod A Rolling to Right Min A Rolling to Left Min A  TRANSFERS: Assistive device utilized: Environmental consultant - 2 wheeled  Sit to stand: SBA Stand to sit: SBA Chair to chair: SBA Floor:  NT  GAIT: Distance walked: 3-4 steps from power wc to treatment table Assistive device utilized: Environmental consultant - 2 wheeled Level of assistance: CGA Gait pattern: step to pattern, decreased stride length, knee flexed in stance- Right, knee flexed in stance- Left, shuffling, antalgic, and trunk flexed Comments: severe crouch in standing/with gait d/t B hip and knee flexion contractures  RAMP: Level of Assistance:  NT Assistive device utilized:  NT Ramp Comments:   CURB:  Level of Assistance:  NT Assistive device utilized:  NT Curb Comments:   STAIRS:  Level of Assistance:  NT  Stair Negotiation Technique:   Number of Stairs:    Height of Stairs:   Comments:    TODAY'S TREATMENT:  05/06/23  TherEx  Nustep L3 with 4 extremities x6 minutes for w/u prior to stretching Transfers S with RW HS stretches 2x60 seconds B sitting PT assisted   Bridges x10 in available ROM STS with hands on knees, forward head and scap retraction when in full standing, eccentric lower x10   Manual  B manual to HS groups side-lying        05/04/23 Therapeutic Exercise: to improve strength and mobility.  Demo, verbal and tactile cues throughout for technique Nustep L5x62min Standing trunk extension 10x with RW Seated quad sets x 10 with heel propped on stool Standing heel raises x6- pain in L knee afterwards Tested strength in bil hips and knees  Manual Therapy: to decrease muscle spasm, pain and improve mobility STM to L hamstrings and hip adductors in sitting  04/19/23 THERAPEUTIC EXERCISE: to improve flexibility, strength and mobility.  Demonstration, verbal and tactile cues throughout for technique.  NuStep - L5 x 7 min with seat back enough to get knee/hip ext stretch Seated Fitter leg press (1 black/2 blue) 2 x 10 each LE Standing hip and knee extension + heel raises x 10, UE support of back of chair  GAIT TRAINING: To normalize gait pattern and improve safety with RW. Gait with RW 90' and 36' with SBA of PT, following with power w/c   MANUAL THERAPY: To promote normalized muscle tension, improved flexibility, improved joint mobility, and increased ROM. Skilled palpation and monitoring of soft tissue during DN Trigger Point Dry-Needling  Treatment instructions: Expect mild to moderate muscle soreness. S/S of pneumothorax if dry needled over a lung field, and to seek immediate medical attention should they occur. Patient verbalized understanding of these instructions and education. Patient Consent Given: Yes Education handout provided: Yes Muscles treated: R distal medial & lateral hamstrings, R proximal lateral hamstrings, R iliacus Electrical stimulation performed: No Parameters: N/A Treatment response/outcome: Twitch Response Elicited and Palpable Increase in Muscle Length STM/DTM, manual TPR and pin & stretch to muscles  addressed with DN   04/16/23 THERAPEUTIC EXERCISE: to  improve flexibility, strength and mobility.  Demonstration, verbal and tactile cues throughout for technique.  NuStep - L5 x 7 min with seat back enough to get knee/hip ext stretch Prone lying over 2 pillows with feet off edge of mat table for hip flexor and prone hang knee extension stretch x 5 min - intermittent knee flexion for break from stretches Hooklying R/L leg lengtheners with single heel on purple FR x 20 bil Hooklying B hip and knee flexion and extension with feet on peanut ball pausing for hip extension isometric into peanut ball at end ROM hip/knee extension Bridge 10 x 3" Bridge + RTB hip ABD isometric 10 x 3" Hooklying alt RTB hip ADB/ER bent-knee fallout 10 x 3"  GAIT TRAINING: To normalize gait pattern and improve safety with RW . Gait with RW x 55' and CGA/SBA of PT via gait belt, following with power w/c    04/14/23 THERAPEUTIC EXERCISE: to improve flexibility, strength and mobility.  Demonstration, verbal and tactile cues throughout for technique. Nustep L4x68min with seat back enough to get knee/hip ext stretch Self- STM with small roller x 1 min ea prior to stretches to help with discomfort and increase blood flow. Seated LAQ 2 x 10 R/L Seated soleus stretch B 2x30 sec Seated gastroc/HS stretch combo with gait belt x2 30 sec B Standing 4 x 1 min at TM with cues to engage glutes and quads Standing heel raise at TM x 10 GAIT with RW CGA with gait belt 4 trials: 2x 20, 1 x 40, 1 x 30 Encouraged pt to use lift chair to help with hip flexor stretch by lowering legs; also using towel roll under ankles while in recliner for prolonged HS stretch.   PATIENT EDUCATION:  Education details: role of DN and DN rational, procedure, outcomes, potential side effects, and recommended post-treatment exercises/activity Person educated: Patient Education method: Explanation and Handouts Education comprehension: verbalized  understanding and needs further education  HOME EXERCISE PROGRAM: Access Code: [facility] URL: [email or web address] Date: 04/19/2023 Prepared by: Glenetta Hew  Exercises - Seated Hamstring Stretch  - 2-3 x daily - 7 x weekly - 3 reps - 30 sec hold - Seated Gastroc Stretch with Strap  - 2-3 x daily - 7 x weekly - 3 reps - 30 sec hold - Single Knee to Chest Stretch  - 2-3 x daily - 7 x weekly - 3 reps - 30 sec hold - Seated Long Arc Quad  - 1 x daily - 7 x weekly - 2 sets - 10 reps - Wide Stance with Counter Support  - 1 x daily - 7 x weekly - 1 sets - 5 reps - 1 min hold - Supine Knee Extension Stretch on Towel Roll  - 1 x daily - 7 x weekly - 1 sets - 3 reps - 60 sec  hold - Prone Knee Extension Hang  - 2 x daily - 7 x weekly - 3-5 min hold - Supine Bridge with Resistance Band  - 1 x daily - 7 x weekly - 2 sets - 10 reps - 5 sec hold - Hooklying Single Leg Bent Knee Fallouts with Resistance  - 1 x daily - 7 x weekly - 2 sets - 10 reps - 3 sec hold  Patient Education - Trigger Point Dry Needling   ASSESSMENT:  CLINICAL IMPRESSION:  Chalet arrives today doing OK, still having quite a bit of pain in L knee. We warmed up on the Nustep then worked on [employer] and  manual to HS groups. Otherwise worked on functional strengthening as able and time allowed this session. Easily fatigued and generally deconditioned. Will continue efforts, she is hopeful to be able to get L TKR soon.   OBJECTIVE IMPAIRMENTS: Abnormal gait, decreased activity tolerance, decreased balance, decreased endurance, decreased knowledge of condition, decreased knowledge of use of DME, decreased mobility, difficulty walking, decreased ROM, decreased strength, decreased safety awareness, hypomobility, increased fascial restrictions, impaired perceived functional ability, increased muscle spasms, impaired flexibility, improper body mechanics, postural dysfunction, and pain.   ACTIVITY LIMITATIONS: carrying,  lifting, standing, squatting, sleeping, stairs, transfers, bed mobility, locomotion level, and caring for others  PARTICIPATION LIMITATIONS: meal prep, cleaning, laundry, driving, shopping, community activity, and yard work  PERSONAL FACTORS: Education, Financial risk analyst, Past/current experiences, Time since onset of injury/illness/exacerbation, and 3+ comorbidities: HTN, DM, OSA, fibromyalgia, depression, prediabetes, DOE, obesity, GERD, hypothyroidism, OA of multiple joints (neck, shoulders, elbows, hands, back, hips, knees), multilevel DDD, lumbar radiculopathy, OSA, pituitary adenoma status post resection  are also affecting patient's functional outcome.   REHAB POTENTIAL: Fair due to severity of contractures and prolonged limited mobility over several years  CLINICAL DECISION MAKING: Evolving/moderate complexity  EVALUATION COMPLEXITY: Moderate   GOALS: Goals reviewed with patient? Yes  SHORT TERM GOALS: Target date: 04/19/2023   Patient will be independent with initial HEP. Baseline: Formal HEP to be established next visit.  Education provided on positional stretching for hip and knee extension in bed on eval. Goal status: MET  04/16/23  2.  Patient will demonstrate >/= 5 degree reduction in B hip and knee flexion contractures. Baseline: Refer to above LE ROM table. Goal status: MET  04/19/23  3.  Patient will be able to ambulate 30+ ft with RW and SBA of PT, demonstrating improved upright posture for more normal gait pattern to increase functional ambulation in home.  Baseline: 3-4 steps with RW in severe crouch with CGA of PT Goal status: MET  03/31/23  LONG TERM GOALS: Target date: 05/17/2023   Patient will be independent with advanced/ongoing HEP to improve outcomes and carryover.  Baseline:  Goal status: IN PROGRESS  2.  Patient will report at least >/= 50% improvement in B hip and knee pain to improve QOL. Baseline: 7/10 Goal status: IN PROGRESS  04/19/23 - Pt reporting significant  reduction in R knee pain and stiffness  3.  Patient will demonstrate improved B hip and knee AROM to [id number] [facility] to allow for normal stance and gait mechanics. Baseline: Refer to above LE ROM table. Goal status: IN PROGRESS  4.  Patient will demonstrate improved B LE strength to >/= 4+/5 for improved stability and ease of mobility. Baseline: Refer to above LE MMT table Goal status: IN PROGRESS- 05/04/23  5.  Patient will be able to ambulate 50-75 ft with RW or LRAD and improved upright posture for more normal gait pattern to increase functional ambulation in home.  Baseline: 3-4 steps with RW in severe crouch due to B hip and knee flexion contracture Goal status: IN PROGRESS  04/19/23 - Pt able to increase ambulation distance to 90 ft, but still with significant crouched posture  6.  Patient will report >/= 47/80 on LEFS to demonstrate improved functional ability. Baseline: 38 / 80 = 47.5 % Goal status: IN PROGRESS  7.  Patient will report >/= 35% on ABC scale to demonstrate improved functional ability and activity confidence with decreased fear of falling. Baseline: 260 / 1600 = 16.3 % Goal status: IN PROGRESS  8.  Patient will demonstrate decreased TUG time by >/= 10 sec to decrease risk for falls with transitional mobility. Baseline: 47.71 sec with RW (03/24/23) Goal status: IN PROGRESS    PLAN:  PT FREQUENCY: 1-2x/week  PT DURATION: 8 weeks  PLANNED INTERVENTIONS: Therapeutic exercises, Therapeutic activity, Neuromuscular re-education, Balance training, Gait training, Patient/Family education, Self Care, Joint mobilization, Stair training, Aquatic Therapy, Dry Needling, Electrical stimulation, Cryotherapy, Moist heat, Taping, Vasopneumatic device, Ultrasound, Ionotophoresis 4mg /ml Dexamethasone, Manual therapy, and Re-evaluation  PLAN FOR NEXT SESSION: DN to L hamstrings and hip adductors; LE stretching and progress to include LE strengthening - review/update HEP as indicated; MT +/- DN  to help reduce muscle tension and improve ROM/reduce muscle contractures  Nedra Hai, PT, DPT 05/06/23 10:12 AM   [facility] [address] [place], [place] [address] [phone]  Fax: [phone]

## 2023-05-07 ENCOUNTER — Telehealth: Payer: Self-pay | Admitting: Internal Medicine

## 2023-05-07 NOTE — Telephone Encounter (Signed)
Copied from CRM [id number]. Topic: General - Other >> May 07, 2023  2:37 PM Everette C wrote: Reason for CRM: The patient would like to speak with a member of clinical staff when possible to discuss ordering a new wheelchair for the patient   The patient shares that they need a smaller wheelchair   Please contact further when possible

## 2023-05-10 ENCOUNTER — Other Ambulatory Visit: Payer: Self-pay | Admitting: Bariatrics

## 2023-05-10 ENCOUNTER — Other Ambulatory Visit: Payer: Self-pay | Admitting: Internal Medicine

## 2023-05-10 DIAGNOSIS — F5089 Other specified eating disorder: Secondary | ICD-10-CM

## 2023-05-10 DIAGNOSIS — E1169 Type 2 diabetes mellitus with other specified complication: Secondary | ICD-10-CM

## 2023-05-11 ENCOUNTER — Ambulatory Visit: Payer: MEDICAID

## 2023-05-11 DIAGNOSIS — R262 Difficulty in walking, not elsewhere classified: Secondary | ICD-10-CM

## 2023-05-11 DIAGNOSIS — M6249 Contracture of muscle, multiple sites: Secondary | ICD-10-CM

## 2023-05-11 DIAGNOSIS — R2689 Other abnormalities of gait and mobility: Secondary | ICD-10-CM

## 2023-05-11 DIAGNOSIS — M6281 Muscle weakness (generalized): Secondary | ICD-10-CM

## 2023-05-11 NOTE — Telephone Encounter (Signed)
No longer on current medication list Requested Prescriptions  Pending Prescriptions Disp Refills   gabapentin (NEURONTIN) 300 MG capsule [Pharmacy Med Name: gabapentin 300 mg capsule] 30 capsule 2    Sig: TAKE ONE CAPSULE BY MOUTH EVERY EVENING (WITH 600mg  tab FOR 900mg  DOSE)     Neurology: Anticonvulsants - gabapentin Passed - 05/10/2023  9:39 AM      Passed - Cr in normal range and within 360 days    Creat  Date Value Ref Range Status  09/22/2016 0.80 0.50 - 1.05 mg/dL Final    Comment:      For patients > or = 65 years of age: The upper reference limit for Creatinine is approximately 13% higher for people identified as African-American.      Creatinine, Ser  Date Value Ref Range Status  10/06/2022 0.96 0.57 - 1.00 mg/dL Final         Passed - Completed PHQ-2 or PHQ-9 in the last 360 days      Passed - Valid encounter within last 12 months    Recent Outpatient Visits           1 month ago Type 2 diabetes mellitus with morbid obesity (HCC)   [place] [facility] Jonah Blue B, MD   6 months ago Type 2 diabetes mellitus with morbid obesity [facility])   [place] Jonah Blue B, MD   10 months ago Type 2 diabetes mellitus with morbid obesity [facility])   [place] [facility] Jonah Blue B, MD   1 year ago Type 2 diabetes mellitus with morbid obesity [facility])   [place] Marcine Matar, MD   1 year ago Mixed stress and urge urinary incontinence    [facility] Marcine Matar, MD       Future Appointments             In 1 month Allyson Sabal, Delton See, MD [facility]   In 2 months Laural Benes, Binnie Rail, MD [facility]

## 2023-05-11 NOTE — Therapy (Signed)
OUTPATIENT PHYSICAL THERAPY TREATMENT   Patient Name: Robin [name] MRN: [id number] DOB:[birth year], 67 y.o., female Today's Date: 05/11/2023  END OF SESSION:  PT End of Session - 05/11/23 1020     Visit Number 10    Date for PT Re-Evaluation 05/17/23    Authorization Type [employer]    Authorization Time Period 04/14/23 - 05/25/23    Authorization - Visit Number 6    PT Start Time 0933    PT Stop Time 1014    PT Time Calculation (min) 41 min    Activity Tolerance Patient tolerated treatment well    Behavior During Therapy [facility] for tasks assessed/performed                  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   Anemia    Anxiety disorder    Arthritis    Asthma    Back pain    Chronic neck pain    Colon polyps    Constipation    Depression    Diabetes mellitus without complication (HCC)    Dyspnea    Edema of lower extremity    Fibromyalgia    GERD (gastroesophageal reflux disease)    Heartburn    Hiatal hernia    small   Hyperlipidemia    Hypertension    Hypothyroidism    Joint pain    Morbid obesity with BMI of 50.0-59.9, adult (HCC)    Osteoarthritis    Other fatigue    Schatzki's ring    non critical   Severe episode of recurrent major depressive disorder, with psychotic features (HCC) 05/26/2018   Sinusitis    Sleep apnea    CPAP, Sleep study at [facility]   SOB (shortness of breath) on exertion    Status post dilation of esophageal narrowing    Swallowing difficulty    Past Surgical History:  Procedure Laterality Date   ABDOMINAL HYSTERECTOMY  02/18/2000   CARDIAC CATHETERIZATION  08/18/2003   normal L main/LAD/L Cfx/RCA (Dr. Erlene Quan)   COLONOSCOPY  2006   Dr. Franky Macho, hyperplastic polyps   COLONOSCOPY  08/06/2011   abnormal terminal ileum for 10cm, erosions, geographical ulceration. Bx small bowel mucosa with prominent intramucosal  lymphoid aggregates, slightly inflammed   COLONOSCOPY WITH PROPOFOL N/A 12/28/2019   Rourk: 4 sessile serrated adenomas removed on the colon.  Next colonoscopy in 3 years.   DIRECT LARYNGOSCOPY  03/15/2012   Procedure: DIRECT LARYNGOSCOPY;  Surgeon: Flo Shanks, MD;  Location: [facility] OR;  Service: ENT;  Laterality: N/A; Dr. Wolicki--:>no foreign body seen. normal esophagus to 40cm   ESOPHAGEAL DILATION  04/17/2015   Procedure: ESOPHAGEAL DILATION;  Surgeon: Corbin Ade, MD;  Location: [facility];  Service: Endoscopy;;   ESOPHAGOGASTRODUODENOSCOPY  08/23/2008   WUJ:WJXB distal esophageal erosions consistent with mild erosive reflux esophagitis, otherwise unremarkable esophagus/ Tiny antral erosions of doubtful clinical significance, otherwise normal stomach, patent pylorus, normal D1 and D2   ESOPHAGOGASTRODUODENOSCOPY  08/06/2011   small hh, noncritical Schatzki's ring s/p 58 F   ESOPHAGOGASTRODUODENOSCOPY N/A 04/17/2015   JYN:WGNFAO s/p dilation   ESOPHAGOGASTRODUODENOSCOPY (EGD) WITH PROPOFOL N/A 01/20/2018   Dr. Jena Gauss: Erosive gastropathy, normal-appearing esophagus status post empiric dilation.  Chronic gastritis, no H. pylori.   ESOPHAGOSCOPY  03/15/2012   Procedure: ESOPHAGOSCOPY;  Surgeon: Flo Shanks, MD;  Location: [facility] OR;  Service: ENT;  Laterality: N/A;  KNEE ARTHROSCOPY Left 04/27/2001   MALONEY DILATION N/A 01/20/2018   Procedure: Elease Hashimoto DILATION;  Surgeon: Corbin Ade, MD;  Location: [facility];  Service: Endoscopy;  Laterality: N/A;   NM MYOCAR PERF WALL MOTION  2003   negative bruce protocol exercise tress test; EF 68%; intermediate risk study due to evidence of anterior wall ischemia extending from mid-ventricle to apex   PITUITARY SURGERY  06/2012   benign tumor, surgeon at [facility]   POLYPECTOMY  12/28/2019   Procedure: POLYPECTOMY;  Surgeon: Corbin Ade, MD;  Location: [facility];  Service: Endoscopy;;   RECTOCELE REPAIR     TRANSTHORACIC  ECHOCARDIOGRAM  2003   EF normal   VAGINAL PROLAPSE REPAIR     VIDEO BRONCHOSCOPY Bilateral 03/08/2017   Procedure: VIDEO BRONCHOSCOPY WITHOUT FLUORO;  Surgeon: Roslynn Amble, MD;  Location: [facility] ENDOSCOPY;  Service: Cardiopulmonary;  Laterality: Bilateral;   Patient Active Problem List   Diagnosis Date Noted   Type 2 diabetes mellitus with obesity (HCC) 02/04/2023   Moderate major depression (HCC) 11/12/2022   BMI 40.0-44.9, adult (HCC) 11/03/2022   B12 deficiency 10/06/2022   Depression 07/09/2022   Class 3 severe obesity with serious comorbidity and body mass index (BMI) of 45.0 to 49.9 in adult [facility]) 07/09/2022   Major depressive disorder, recurrent episode (HCC) 07/02/2022   Generalized anxiety disorder 07/02/2022   Body mass index 40.0-44.9, adult (HCC) 02/25/2022   Morbid obesity (HCC) 02/25/2022   Right knee pain 02/18/2022   Diabetes mellitus type 2 in obese 02/18/2022   Lumbar foraminal stenosis 01/23/2022   Lumbar radiculopathy 01/23/2022   Pain in both knees 01/23/2022   Vitamin D deficiency 11/18/2021   Polyp of descending colon 02/15/2020   Pituitary tumor 08/21/2019   Primary osteoarthritis of both hips 08/21/2019   Primary osteoarthritis of both knees 08/21/2019   Pain in left shoulder 08/21/2019   Morbid obesity with BMI of 60.0-69.9, adult (HCC) 07/17/2019   Pedal edema 07/17/2019   Multilevel degenerative disc disease 10/31/2018   Change in bowel function 10/28/2018   Gastritis and gastroduodenitis 04/26/2018   Atrophic vaginitis 04/01/2018   Cough, persistent 02/08/2018   Dysphagia 12/14/2017   Generalized OA 07/30/2017   Urinary incontinence 07/23/2017   Early satiety 06/16/2017   Hx of iron deficiency anemia 05/28/2017   Throat pain in adult 04/05/2017   Prediabetes 02/04/2017   Dyspnea on exertion 12/23/2016   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 12/23/2016   Hemoptysis 12/23/2016   Fibromyalgia  08/18/2016   Chronic lymphocytic thyroiditis 08/18/2016   Persistent depressive disorder 04/01/2016   OSA (obstructive sleep apnea) 04/01/2016   Essential hypertension 12/09/2015   Hypothyroidism 12/09/2015   Morbid obesity due to excess calories (HCC) 12/09/2015   Constipation 05/27/2015   Fatty liver 05/27/2015   Abdominal pain, chronic, epigastric 07/04/2012   History of gastroesophageal reflux (GERD) 06/09/2012   Hyperlipidemia 06/09/2012   Refusal of blood transfusions as patient is Jehovah's Witness 06/09/2012   Anxiety 06/09/2012   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   Abnormal small bowel biopsy 09/18/2011   Lactose intolerance 09/18/2011   GERD 01/21/2010    PCP: Marcine Matar, MD   REFERRING PROVIDER: Marcine Matar, MD   REFERRING DIAG: R26.89 (ICD-10-CM) - Decreased mobility   THERAPY DIAG:  Other abnormalities of gait and mobility  Contracture of muscle, multiple sites  Muscle weakness (generalized)  Difficulty in walking, not elsewhere classified  RATIONALE FOR EVALUATION AND TREATMENT: Rehabilitation  ONSET DATE: 10+ years  NEXT MD VISIT: 07/15/23   SUBJECTIVE:   SUBJECTIVE STATEMENT: Pt reports that she struggled this weekend, just had increased pain. Now using a regular WC, her power chair is getting battery changed.  PAIN: Are you having pain? Yes: NPRS scale: 5/10 Pain location: L knee Pain description: sharp, achy Aggravating factors: straightening her knees, standing and walking Relieving factors: exercise, ice, heat, pain meds  Are you having pain? Yes: NPRS scale: 5/10 Pain location: B hips (L>R Pain description: sharp, achy Aggravating factors: moving in the bed Relieving factors: exercise, ice, heat, pain meds  Bil shoulders 4/10 pain  PERTINENT HISTORY: HTN, DM, OSA, fibromyalgia, depression, prediabetes, DOE, obesity, GERD, hypothyroidism, OA of multiple joints (neck, shoulders, elbows, hands, back, hips, knees),  multilevel DDD, lumbar radiculopathy, OSA, pituitary adenoma status post resection   PRECAUTIONS: Fall  HAND DOMINANCE: Right  WEIGHT BEARING RESTRICTIONS: No  FALLS:  Has patient fallen in last 6 months? No  LIVING ENVIRONMENT: Lives with: lives with their spouse Lives in: House/apartment Stairs: Yes: External: 3 steps; ramp available Has following equipment at home: Counselling psychologist, Environmental consultant - 2 wheeled, Wheelchair (power), shower chair, bed side commode, Grab bars, and Ramped entry  OCCUPATION: Retired  PLOF: Independent with basic ADLs, Independent with household mobility with device, Requires assistive device for independence, Needs assistance with homemaking, and Leisure: sedentary  PATIENT GOALS: "I want to get mobile enough to have TKR surgery."   OBJECTIVE: (objective measures completed at initial evaluation unless otherwise dated)  DIAGNOSTIC FINDINGS:  02/25/22 - B knee x-rays:  Advanced tricompartmental degenerative joint disease. Bone-on-bone joint space narrowing. Varus deformity.  PATIENT SURVEYS:  ABC scale 260 / 1600 = 16.3 % LEFS 38 / 80 = 47.5 %  COGNITION: Overall cognitive status: Within functional limits for tasks assessed     SENSATION: WFL  MUSCLE LENGTH: Hamstrings: severely tight B ITB: NT Piriformis: mod/severe tight B Hip flexors: severely tight B Quads: mod/severe tight B Heelcord: NT  POSTURE:  flexed trunk  and severe crouch in standing d/t hip and knee flexion contractures  LOWER EXTREMITY ROM:  Active & Passive ROM Right eval Left eval Right 03/31/23 Left 03/31/23 Right 04/19/23 Left 04/19/23 R/L 8/6  Hip flexion         Hip extension -30 -30 -21 from neutral reclined position on white bolster -23 from neutral reclined position on white bolster     Hip abduction         Hip adduction         Hip internal rotation         Hip external rotation         Knee flexion 120 112       Knee extension -31 -43 -27 -38 -23 -38 38/58   Ankle dorsiflexion         Ankle plantarflexion         Ankle inversion         Ankle eversion          (Blank rows = not tested)  LOWER EXTREMITY MMT:  MMT Right eval Left eval R 05/04/23 L 05/04/23  Hip flexion 4+ 4+ 5 5  Hip extension 4 for available ROM 4 for available ROM 4 4+  Hip abduction 4- 4- 4 4  Hip adduction 4 4 4+ 4+  Hip internal rotation 4+ 4+    Hip external rotation 4 4- 4+ 4  Knee flexion 4+ 4+ 4+ 4+  Knee extension 4+ for available ROM 4+ for available ROM 4+ 4+  Ankle dorsiflexion 4+ 4+    Ankle plantarflexion      Ankle inversion      Ankle eversion       (Blank rows = not tested)  FUNCTIONAL TESTS: (03/24/23) 5 times sit to stand: 21.78 sec w/o UE assist (unable to achieve full upright posture due to hip and knee flexion contractures) Timed up and go (TUG): 47.71 sec with RW 10 meter walk test: 30.31 sec with RW; Gait speed = 1.08 ft/sec with RW  BED MOBILITY:  Sit to supine SBA Supine to sit Mod A Rolling to Right Min A Rolling to Left Min A  TRANSFERS: Assistive device utilized: Environmental consultant - 2 wheeled  Sit to stand: SBA Stand to sit: SBA Chair to chair: SBA Floor:  NT  GAIT: Distance walked: 3-4 steps from power wc to treatment table Assistive device utilized: Environmental consultant - 2 wheeled Level of assistance: CGA Gait pattern: step to pattern, decreased stride length, knee flexed in stance- Right, knee flexed in stance- Left, shuffling, antalgic, and trunk flexed Comments: severe crouch in standing/with gait d/t B hip and knee flexion contractures  RAMP: Level of Assistance:  NT Assistive device utilized:  NT Ramp Comments:   CURB:  Level of Assistance:  NT Assistive device utilized:  NT Curb Comments:   STAIRS:  Level of Assistance:  NT  Stair Negotiation Technique:   Number of Stairs:    Height of Stairs:   Comments:    TODAY'S TREATMENT: 05/11/23 Therapeutic Activity: to improve functional performance. Reviewed WC mobility, use of  brakes and steering with patient  Measured knee extension ROM in supine TUG: 35 seconds  Therapeutic Exercise: to improve strength and mobility.  Demo, verbal and tactile cues throughout for technique.  Nustep L6x12min UE/LE  05/06/23  TherEx  Nustep L3 with 4 extremities x6 minutes for w/u prior to stretching Transfers S with RW HS stretches 2x60 seconds B sitting PT assisted  Bridges x10 in available ROM STS with hands on knees, forward head and scap retraction when in full standing, eccentric lower x10   Manual  B manual to HS groups side-lying        05/04/23 Therapeutic Exercise: to improve strength and mobility.  Demo, verbal and tactile cues throughout for technique Nustep L5x40min Standing trunk extension 10x with RW Seated quad sets x 10 with heel propped on stool Standing heel raises x6- pain in L knee afterwards Tested strength in bil hips and knees  Manual Therapy: to decrease muscle spasm, pain and improve mobility STM to L hamstrings and hip adductors in sitting  04/19/23 THERAPEUTIC EXERCISE: to improve flexibility, strength and mobility.  Demonstration, verbal and tactile cues throughout for technique.  NuStep - L5 x 7 min with seat back enough to get knee/hip ext stretch Seated Fitter leg press (1 black/2 blue) 2 x 10 each LE Standing hip and knee extension + heel raises x 10, UE support of back of chair  GAIT TRAINING: To normalize gait pattern and improve safety with RW. Gait with RW 90' and 68' with SBA of PT, following with power w/c   MANUAL THERAPY: To promote normalized muscle tension, improved flexibility, improved joint mobility, and increased ROM. Skilled palpation and monitoring of soft tissue during DN Trigger Point Dry-Needling  Treatment instructions: Expect mild to moderate muscle soreness. S/S of pneumothorax if dry needled over a lung field, and to seek immediate medical attention should  they occur. Patient verbalized understanding of  these instructions and education. Patient Consent Given: Yes Education handout provided: Yes Muscles treated: R distal medial & lateral hamstrings, R proximal lateral hamstrings, R iliacus Electrical stimulation performed: No Parameters: N/A Treatment response/outcome: Twitch Response Elicited and Palpable Increase in Muscle Length STM/DTM, manual TPR and pin & stretch to muscles addressed with DN   04/16/23 THERAPEUTIC EXERCISE: to improve flexibility, strength and mobility.  Demonstration, verbal and tactile cues throughout for technique.  NuStep - L5 x 7 min with seat back enough to get knee/hip ext stretch Prone lying over 2 pillows with feet off edge of mat table for hip flexor and prone hang knee extension stretch x 5 min - intermittent knee flexion for break from stretches Hooklying R/L leg lengtheners with single heel on purple FR x 20 bil Hooklying B hip and knee flexion and extension with feet on peanut ball pausing for hip extension isometric into peanut ball at end ROM hip/knee extension Bridge 10 x 3" Bridge + RTB hip ABD isometric 10 x 3" Hooklying alt RTB hip ADB/ER bent-knee fallout 10 x 3"  GAIT TRAINING: To normalize gait pattern and improve safety with RW . Gait with RW x 55' and CGA/SBA of PT via gait belt, following with power w/c    04/14/23 THERAPEUTIC EXERCISE: to improve flexibility, strength and mobility.  Demonstration, verbal and tactile cues throughout for technique. Nustep L4x65min with seat back enough to get knee/hip ext stretch Self- STM with small roller x 1 min ea prior to stretches to help with discomfort and increase blood flow. Seated LAQ 2 x 10 R/L Seated soleus stretch B 2x30 sec Seated gastroc/HS stretch combo with gait belt x2 30 sec B Standing 4 x 1 min at TM with cues to engage glutes and quads Standing heel raise at TM x 10 GAIT with RW CGA with gait belt 4 trials: 2x 20, 1 x 40, 1 x 30 Encouraged pt to use lift chair to help with hip flexor  stretch by lowering legs; also using towel roll under ankles while in recliner for prolonged HS stretch.   PATIENT EDUCATION:  Education details: role of DN and DN rational, procedure, outcomes, potential side effects, and recommended post-treatment exercises/activity Person educated: Patient Education method: Explanation and Handouts Education comprehension: verbalized understanding and needs further education  HOME EXERCISE PROGRAM: Access Code: [facility] URL: [email or web address] Date: 04/19/2023 Prepared by: Glenetta Hew  Exercises - Seated Hamstring Stretch  - 2-3 x daily - 7 x weekly - 3 reps - 30 sec hold - Seated Gastroc Stretch with Strap  - 2-3 x daily - 7 x weekly - 3 reps - 30 sec hold - Single Knee to Chest Stretch  - 2-3 x daily - 7 x weekly - 3 reps - 30 sec hold - Seated Long Arc Quad  - 1 x daily - 7 x weekly - 2 sets - 10 reps - Wide Stance with Counter Support  - 1 x daily - 7 x weekly - 1 sets - 5 reps - 1 min hold - Supine Knee Extension Stretch on Towel Roll  - 1 x daily - 7 x weekly - 1 sets - 3 reps - 60 sec  hold - Prone Knee Extension Hang  - 2 x daily - 7 x weekly - 3-5 min hold - Supine Bridge with Resistance Band  - 1 x daily - 7 x weekly - 2 sets - 10 reps - 5 sec hold - Hooklying  Single Leg Bent Knee Fallouts with Resistance  - 1 x daily - 7 x weekly - 2 sets - 10 reps - 3 sec hold  Patient Education - Trigger Point Dry Needling   ASSESSMENT:  CLINICAL IMPRESSION:  Pt arrived with WC instead of power chair she has been using to get the battery replaced. We reviewed WC mobility with her to ease daily movement. Also reassessed TUG, pt shows improvement by 12 points today. Today she doesn't show improvement with knee extension either direction. She is able to walk normal distances with RW, however still flexed and limited with postural alignment d/t contractures. She continues to benefit from skilled therapy.  OBJECTIVE IMPAIRMENTS:  Abnormal gait, decreased activity tolerance, decreased balance, decreased endurance, decreased knowledge of condition, decreased knowledge of use of DME, decreased mobility, difficulty walking, decreased ROM, decreased strength, decreased safety awareness, hypomobility, increased fascial restrictions, impaired perceived functional ability, increased muscle spasms, impaired flexibility, improper body mechanics, postural dysfunction, and pain.   ACTIVITY LIMITATIONS: carrying, lifting, standing, squatting, sleeping, stairs, transfers, bed mobility, locomotion level, and caring for others  PARTICIPATION LIMITATIONS: meal prep, cleaning, laundry, driving, shopping, community activity, and yard work  PERSONAL FACTORS: Education, Financial risk analyst, Past/current experiences, Time since onset of injury/illness/exacerbation, and 3+ comorbidities: HTN, DM, OSA, fibromyalgia, depression, prediabetes, DOE, obesity, GERD, hypothyroidism, OA of multiple joints (neck, shoulders, elbows, hands, back, hips, knees), multilevel DDD, lumbar radiculopathy, OSA, pituitary adenoma status post resection  are also affecting patient's functional outcome.   REHAB POTENTIAL: Fair due to severity of contractures and prolonged limited mobility over several years  CLINICAL DECISION MAKING: Evolving/moderate complexity  EVALUATION COMPLEXITY: Moderate   GOALS: Goals reviewed with patient? Yes  SHORT TERM GOALS: Target date: 04/19/2023   Patient will be independent with initial HEP. Baseline: Formal HEP to be established next visit.  Education provided on positional stretching for hip and knee extension in bed on eval. Goal status: MET  04/16/23  2.  Patient will demonstrate >/= 5 degree reduction in B hip and knee flexion contractures. Baseline: Refer to above LE ROM table. Goal status: MET  04/19/23  3.  Patient will be able to ambulate 30+ ft with RW and SBA of PT, demonstrating improved upright posture for more normal gait  pattern to increase functional ambulation in home.  Baseline: 3-4 steps with RW in severe crouch with CGA of PT Goal status: MET  03/31/23  LONG TERM GOALS: Target date: 05/17/2023   Patient will be independent with advanced/ongoing HEP to improve outcomes and carryover.  Baseline:  Goal status: IN PROGRESS  2.  Patient will report at least >/= 50% improvement in B hip and knee pain to improve QOL. Baseline: 7/10 Goal status: IN PROGRESS  04/19/23 - Pt reporting significant reduction in R knee pain and stiffness  3.  Patient will demonstrate improved B hip and knee AROM to [facility] to allow for normal stance and gait mechanics. Baseline: Refer to above LE ROM table. Goal status: IN PROGRESS- 05/11/23  4.  Patient will demonstrate improved B LE strength to >/= 4+/5 for improved stability and ease of mobility. Baseline: Refer to above LE MMT table Goal status: IN PROGRESS- 05/04/23  5.  Patient will be able to ambulate 50-75 ft with RW or LRAD and improved upright posture for more normal gait pattern to increase functional ambulation in home.  Baseline: 3-4 steps with RW in severe crouch due to B hip and knee flexion contracture Goal status: IN PROGRESS  04/19/23 -  Pt able to increase ambulation distance to 90 ft, but still with significant crouched posture  6.  Patient will report >/= 47/80 on LEFS to demonstrate improved functional ability. Baseline: 38 / 80 = 47.5 % Goal status: IN PROGRESS-   7.  Patient will report >/= 35% on ABC scale to demonstrate improved functional ability and activity confidence with decreased fear of falling. Baseline: 260 / 1600 = 16.3 % Goal status: IN PROGRESS  8.  Patient will demonstrate decreased TUG time by >/= 10 sec to decrease risk for falls with transitional mobility. Baseline: 47.71 sec with RW (03/24/23) Goal status: IN PROGRESS - 30 seconds 05/11/23   PLAN:  PT FREQUENCY: 1-2x/week  PT DURATION: 8 weeks  PLANNED INTERVENTIONS: Therapeutic  exercises, Therapeutic activity, Neuromuscular re-education, Balance training, Gait training, Patient/Family education, Self Care, Joint mobilization, Stair training, Aquatic Therapy, Dry Needling, Electrical stimulation, Cryotherapy, Moist heat, Taping, Vasopneumatic device, Ultrasound, Ionotophoresis 4mg /ml Dexamethasone, Manual therapy, and Re-evaluation  PLAN FOR NEXT SESSION: DN to L hamstrings and hip adductors; LE stretching and progress to include LE strengthening - review/update HEP as indicated; MT +/- DN to help reduce muscle tension and improve ROM/reduce muscle contractures  Darleene Cleaver, PTA  05/11/23 11:43 AM   [facility] [address] [place], [place] [address] [phone]  Fax: [phone]

## 2023-05-12 ENCOUNTER — Ambulatory Visit (INDEPENDENT_AMBULATORY_CARE_PROVIDER_SITE_OTHER): Payer: MEDICAID | Admitting: Clinical

## 2023-05-12 ENCOUNTER — Other Ambulatory Visit: Payer: Self-pay | Admitting: Internal Medicine

## 2023-05-12 ENCOUNTER — Other Ambulatory Visit: Payer: Self-pay | Admitting: Bariatrics

## 2023-05-12 DIAGNOSIS — F341 Dysthymic disorder: Secondary | ICD-10-CM

## 2023-05-12 DIAGNOSIS — E1169 Type 2 diabetes mellitus with other specified complication: Secondary | ICD-10-CM

## 2023-05-12 DIAGNOSIS — F5089 Other specified eating disorder: Secondary | ICD-10-CM

## 2023-05-12 NOTE — Telephone Encounter (Signed)
I spoke to the patient and discussed the need for an appointment to document the reasons for the smaller wheelchair. She was not sure that she wanted to change her appointment from October.  She then asked about just switching her current wheelchair for a smaller one.  I told her that I would contact [employer] and get back to her.    I spoke to [facility] and he said he would need to check with the local branch to see if switching the wheelchair is an option.  He then said that the phone number for the [facility] branch: [phone] was not working at this time. He was going to send a message to the branch and request that they reach out to the patient to discuss her request. The patient's phone number was verified.    I then spoke to the patient again and explained my call with [employer]. She was very Adult nurse.  I told her to please call me with any questions and if she does need an office visit before October, we can schedule that.
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
## 2023-05-13 ENCOUNTER — Encounter: Payer: Self-pay | Admitting: Bariatrics

## 2023-05-13 ENCOUNTER — Ambulatory Visit (INDEPENDENT_AMBULATORY_CARE_PROVIDER_SITE_OTHER): Payer: MEDICAID | Admitting: Bariatrics

## 2023-05-13 DIAGNOSIS — Z6839 Body mass index (BMI) 39.0-39.9, adult: Secondary | ICD-10-CM | POA: Diagnosis not present

## 2023-05-13 DIAGNOSIS — E669 Obesity, unspecified: Secondary | ICD-10-CM

## 2023-05-13 DIAGNOSIS — E1169 Type 2 diabetes mellitus with other specified complication: Secondary | ICD-10-CM

## 2023-05-13 DIAGNOSIS — Z7985 Long-term (current) use of injectable non-insulin antidiabetic drugs: Secondary | ICD-10-CM | POA: Diagnosis not present

## 2023-05-13 MED ORDER — TRULICITY 4.5 MG/0.5ML ~~LOC~~ SOAJ
4.5000 mg | SUBCUTANEOUS | 0 refills | Status: DC
Start: 2023-05-13 — End: 2023-06-10

## 2023-05-13 NOTE — Progress Notes (Signed)
   WEIGHT SUMMARY AND BIOMETRICS  Weight Lost Since Last Visit: 6lb  Vitals Temp: 97.9 F (36.6 C) BP: 125/82 Pulse Rate: (!) 58 SpO2: 100 %   Anthropometric Measurements Height: 5\' 8"  (1.727 m) Weight: 258 lb (117 kg) BMI (Calculated): 39.24 Weight at Last Visit: 264lb Weight Lost Since Last Visit: 6lb Starting Weight: 310lb Total Weight Loss (lbs): 52 lb (23.6 kg)   Body Composition  Body Fat %: 50.6 % Fat Mass (lbs): 131 lbs Muscle Mass (lbs): 121.4 lbs Total Body Water (lbs): 86 lbs Visceral Fat Rating : 16   Other Clinical Data Fasting: no Labs: no Today's Visit #: 25 Starting Date: 06/03/21    OBESITY Robin [name] is here to discuss her progress with her obesity treatment plan along with follow-up of her obesity related diagnoses.     Nutrition Plan: the Category 3 plan - 40% adherence.  Current exercise:  Chair exercises  Interim History:  She is down 6 lbs. She is eating smaller portions and making better choices.  Eating all of the food on the plan., Protein intake is as prescribed, and Is skipping meals  Pharmacotherapy: [place] is on Trulicity 4.5 mg SQ weekly Adverse side effects: None Hunger is moderately controlled.  Cravings are moderately controlled.  Assessment/Plan:   1. Type 2 diabetes mellitus with obesity (HCC)  HgbA1c is at goal. Last A1c was 5.4 CBGs: Not checking      Episodes of hypoglycemia: no Medication(s): Trulicity 4.5 mg SQ weekly  Lab Results  Component Value Date   HGBA1C 5.4 03/15/2023   HGBA1C 5.3 10/06/2022   HGBA1C 6.3 07/10/2022   Lab Results  Component Value Date   LDLCALC 68 10/06/2022   CREATININE 0.96 10/06/2022   Lab Results  Component Value Date   GFR 87.84 03/08/2018    Plan: Continue and refill Trulicity 4.5 mg SQ weekly Continue all other medications.  Will keep all carbohydrates low both sweets and starches.  Will continue exercise regimen to 30 to 60 minutes on most days of the  week.  Aim for 7 to 9 hours of sleep nightly.  Eat more low glycemic index foods.     Generalized Obesity: Current BMI BMI (Calculated): 39.24   Pharmacotherapy Plan Continue and refill  Trulicity 4.5 mg SQ weekly  Orchid is currently in the action stage of change. As such, her goal is to continue with weight loss efforts.  She has agreed to the Category 3 plan.  Exercise goals: Older adults should do exercises that maintain or improve balance if they are at risk of falling.   Behavioral modification strategies: no meal skipping, meal planning , keep healthy foods in the home, weigh protein portions, and mindful eating.  Ronja has agreed to follow-up with our clinic in 4 weeks.     Objective:   VITALS: Per patient if applicable, see vitals. GENERAL: Alert and in no acute distress. CARDIOPULMONARY: No increased WOB. Speaking in clear sentences.  PSYCH: Pleasant and cooperative. Speech normal rate and rhythm. Affect is appropriate. Insight and judgement are appropriate. Attention is focused, linear, and appropriate.  NEURO: Oriented as arrived to appointment on time with no prompting.   Attestation Statements:    This was prepared with the assistance of Engineer, civil (consulting).  Occasional wrong-word or sound-a-like substitutions may have occurred due to the inherent limitations of voice recognition software.   Corinna Capra, DO

## 2023-05-14 ENCOUNTER — Encounter: Payer: Self-pay | Admitting: Physical Therapy

## 2023-05-14 ENCOUNTER — Ambulatory Visit: Payer: MEDICAID | Admitting: Physical Therapy

## 2023-05-14 ENCOUNTER — Other Ambulatory Visit: Payer: Self-pay | Admitting: Bariatrics

## 2023-05-14 DIAGNOSIS — R262 Difficulty in walking, not elsewhere classified: Secondary | ICD-10-CM

## 2023-05-14 DIAGNOSIS — F5089 Other specified eating disorder: Secondary | ICD-10-CM

## 2023-05-14 DIAGNOSIS — R2689 Other abnormalities of gait and mobility: Secondary | ICD-10-CM | POA: Diagnosis not present

## 2023-05-14 DIAGNOSIS — M6249 Contracture of muscle, multiple sites: Secondary | ICD-10-CM

## 2023-05-14 DIAGNOSIS — M6281 Muscle weakness (generalized): Secondary | ICD-10-CM

## 2023-05-14 NOTE — Therapy (Signed)
OUTPATIENT PHYSICAL THERAPY TREATMENT / DISCHARGE SUMMARY  Progress Note  Reporting Period 03/22/2023 to 05/14/2023   See note below for Objective Data and Assessment of Progress/Goals.     Patient Name: Robin [name] MRN: [id number] DOB:Aug 30, 1956, 67 y.o., female Today's Date: 05/14/2023  END OF SESSION:  PT End of Session - 05/14/23 0931     Visit Number 11    Date for PT Re-Evaluation 05/17/23    Authorization Type [employer]    Authorization Time Period 04/14/23 - 05/17/23  (33 units)    Authorization - Visit Number 26   units   Authorization - Number of Visits 33   units   PT Start Time 0931    PT Stop Time 1020    PT Time Calculation (min) 49 min    Activity Tolerance Patient tolerated treatment well    Behavior During Therapy [facility] for tasks assessed/performed                   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   Anemia    Anxiety disorder    Arthritis    Asthma    Back pain    Chronic neck pain    Colon polyps    Constipation    Depression    Diabetes mellitus without complication (HCC)    Dyspnea    Edema of lower extremity    Fibromyalgia    GERD (gastroesophageal reflux disease)    Heartburn    Hiatal hernia    small   Hyperlipidemia    Hypertension    Hypothyroidism    Joint pain    Morbid obesity with BMI of 50.0-59.9, adult (HCC)    Osteoarthritis    Other fatigue    Schatzki's ring    non critical   Severe episode of recurrent major depressive disorder, with psychotic features (HCC) 05/26/2018   Sinusitis    Sleep apnea    CPAP, Sleep study at [facility]   SOB (shortness of breath) on exertion    Status post dilation of esophageal narrowing    Swallowing difficulty    Past Surgical History:  Procedure Laterality Date   ABDOMINAL HYSTERECTOMY  02/18/2000   CARDIAC CATHETERIZATION  08/18/2003   normal L main/LAD/L Cfx/RCA (Dr.  Erlene Quan)   COLONOSCOPY  2006   Dr. Franky Macho, hyperplastic polyps   COLONOSCOPY  08/06/2011   abnormal terminal ileum for 10cm, erosions, geographical ulceration. Bx small bowel mucosa with prominent intramucosal lymphoid aggregates, slightly inflammed   COLONOSCOPY WITH PROPOFOL N/A 12/28/2019   Rourk: 4 sessile serrated adenomas removed on the colon.  Next colonoscopy in 3 years.   DIRECT LARYNGOSCOPY  03/15/2012   Procedure: DIRECT LARYNGOSCOPY;  Surgeon: Flo Shanks, MD;  Location: [facility] OR;  Service: ENT;  Laterality: N/A; Dr. Wolicki--:>no foreign body seen. normal esophagus to 40cm   ESOPHAGEAL DILATION  04/17/2015   Procedure: ESOPHAGEAL DILATION;  Surgeon: Corbin Ade, MD;  [facility];  Service: Endoscopy;;   ESOPHAGOGASTRODUODENOSCOPY  08/23/2008   NUU:VOZD distal esophageal erosions consistent with mild erosive reflux esophagitis, otherwise unremarkable esophagus/ Tiny antral erosions of doubtful clinical significance, otherwise normal stomach, patent pylorus, normal D1 and D2   ESOPHAGOGASTRODUODENOSCOPY  08/06/2011   small hh, noncritical Schatzki's ring s/p 58 F   ESOPHAGOGASTRODUODENOSCOPY N/A 04/17/2015   GUY:QIHKVQ s/p dilation   ESOPHAGOGASTRODUODENOSCOPY (EGD) WITH PROPOFOL N/A 01/20/2018  Dr. Jena Gauss: Erosive gastropathy, normal-appearing esophagus status post empiric dilation.  Chronic gastritis, no H. pylori.   ESOPHAGOSCOPY  03/15/2012   Procedure: ESOPHAGOSCOPY;  Surgeon: Flo Shanks, MD;  Location: [facility] OR;  Service: ENT;  Laterality: N/A;   KNEE ARTHROSCOPY Left 04/27/2001   MALONEY DILATION N/A 01/20/2018   Procedure: Elease Hashimoto DILATION;  Surgeon: Corbin Ade, MD;  Location: [facility];  Service: Endoscopy;  Laterality: N/A;   NM MYOCAR PERF WALL MOTION  2003   negative bruce protocol exercise tress test; EF 68%; intermediate risk study due to evidence of anterior wall ischemia extending from mid-ventricle to apex   PITUITARY SURGERY   06/2012   benign tumor, surgeon at [facility]   POLYPECTOMY  12/28/2019   Procedure: POLYPECTOMY;  Surgeon: Corbin Ade, MD;  Location: [facility];  Service: Endoscopy;;   RECTOCELE REPAIR     TRANSTHORACIC ECHOCARDIOGRAM  2003   EF normal   VAGINAL PROLAPSE REPAIR     VIDEO BRONCHOSCOPY Bilateral 03/08/2017   Procedure: VIDEO BRONCHOSCOPY WITHOUT FLUORO;  Surgeon: Roslynn Amble, MD;  Location: [facility] ENDOSCOPY;  Service: Cardiopulmonary;  Laterality: Bilateral;   Patient Active Problem List   Diagnosis Date Noted   Type 2 diabetes mellitus with obesity (HCC) 02/04/2023   Moderate major depression (HCC) 11/12/2022   BMI 40.0-44.9, adult (HCC) 11/03/2022   B12 deficiency 10/06/2022   Depression 07/09/2022   Class 3 severe obesity with serious comorbidity and body mass index (BMI) of 45.0 to 49.9 in adult (HCC) 07/09/2022   Major depressive disorder, recurrent episode (HCC) 07/02/2022   Generalized anxiety disorder 07/02/2022   Body mass index 40.0-44.9, adult (HCC) 02/25/2022   Morbid obesity (HCC) 02/25/2022   Right knee pain 02/18/2022   Diabetes mellitus type 2 in obese 02/18/2022   Lumbar foraminal stenosis 01/23/2022   Lumbar radiculopathy 01/23/2022   Pain in both knees 01/23/2022   Vitamin D deficiency 11/18/2021   Polyp of descending colon 02/15/2020   Pituitary tumor 08/21/2019   Primary osteoarthritis of both hips 08/21/2019   Primary osteoarthritis of both knees 08/21/2019   Pain in left shoulder 08/21/2019   Morbid obesity with BMI of 60.0-69.9, adult (HCC) 07/17/2019   Pedal edema 07/17/2019   Multilevel degenerative disc disease 10/31/2018   Change in bowel function 10/28/2018   Gastritis and gastroduodenitis 04/26/2018   Atrophic vaginitis 04/01/2018   Cough, persistent 02/08/2018   Dysphagia 12/14/2017   Generalized OA 07/30/2017   Urinary incontinence 07/23/2017   Early satiety 06/16/2017   Hx of iron deficiency anemia 05/28/2017   Throat pain in  adult 04/05/2017   Prediabetes 02/04/2017   Dyspnea on exertion 12/23/2016   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 12/23/2016   Hemoptysis 12/23/2016   Fibromyalgia 08/18/2016   Chronic lymphocytic thyroiditis 08/18/2016   Persistent depressive disorder 04/01/2016   OSA (obstructive sleep apnea) 04/01/2016   Essential hypertension 12/09/2015   Hypothyroidism 12/09/2015   Morbid obesity due to excess calories (HCC) 12/09/2015   Constipation 05/27/2015   Fatty liver 05/27/2015   Abdominal pain, chronic, epigastric 07/04/2012   History of gastroesophageal reflux (GERD) 06/09/2012   Hyperlipidemia 06/09/2012   Refusal of blood transfusions as patient is Jehovah's Witness 06/09/2012   Anxiety 06/09/2012   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   Abnormal small bowel biopsy 09/18/2011   Lactose intolerance 09/18/2011   GERD 01/21/2010    PCP: Marcine Matar, MD   REFERRING PROVIDER: Marcine Matar, MD  REFERRING DIAG: R26.89 (ICD-10-CM) - Decreased mobility   THERAPY DIAG:  Other abnormalities of gait and mobility  Contracture of muscle, multiple sites  Muscle weakness (generalized)  Difficulty in walking, not elsewhere classified  RATIONALE FOR EVALUATION AND TREATMENT: Rehabilitation  ONSET DATE: 10+ years  NEXT MD VISIT: 07/15/23   SUBJECTIVE:   SUBJECTIVE STATEMENT: Pt arrives to PT in a Staxi-chair, stating that the battery is dying on her power wc and she is needing to have it replaced.  PAIN: Are you having pain? Yes: NPRS scale:  6/10 Pain location: L knee Pain description: sharp, achy Aggravating factors: straightening her knees, standing and walking Relieving factors: exercise, ice, heat, pain meds  Are you having pain? Yes: NPRS scale:  5/10 Pain location: R hip Pain description: sharp, achy Aggravating factors: moving in the bed Relieving factors: exercise, ice, heat, pain meds  Bil shoulders  4/10 pain  PERTINENT HISTORY: HTN, DM, OSA, fibromyalgia, depression, prediabetes, DOE, obesity, GERD, hypothyroidism, OA of multiple joints (neck, shoulders, elbows, hands, back, hips, knees), multilevel DDD, lumbar radiculopathy, OSA, pituitary adenoma status post resection   PRECAUTIONS: Fall  HAND DOMINANCE: Right  WEIGHT BEARING RESTRICTIONS: No  FALLS:  Has patient fallen in last 6 months? No  LIVING ENVIRONMENT: Lives with: lives with their spouse Lives in: House/apartment Stairs: Yes: External: 3 steps; ramp available Has following equipment at home: Counselling psychologist, Environmental consultant - 2 wheeled, Wheelchair (power), shower chair, bed side commode, Grab bars, and Ramped entry  OCCUPATION: Retired  PLOF: Independent with basic ADLs, Independent with household mobility with device, Requires assistive device for independence, Needs assistance with homemaking, and Leisure: sedentary  PATIENT GOALS: "I want to get mobile enough to have TKR surgery."   OBJECTIVE: (objective measures completed at initial evaluation unless otherwise dated)  DIAGNOSTIC FINDINGS:  02/25/22 - B knee x-rays:  Advanced tricompartmental degenerative joint disease. Bone-on-bone joint space narrowing. Varus deformity.  PATIENT SURVEYS:  ABC scale 260 / 1600 = 16.3 % LEFS 38 / 80 = 47.5 %  05/14/23 ABC scale: 180 / 1600 = 11.3 % LEFS: 17 / 80 = 21.3 %   COGNITION: Overall cognitive status: Within functional limits for tasks assessed     SENSATION: WFL  MUSCLE LENGTH: Hamstrings: severely tight B ITB: NT Piriformis: mod/severe tight B Hip flexors: severely tight B Quads: mod/severe tight B Heelcord: NT  POSTURE:  flexed trunk  and severe crouch in standing d/t hip and knee flexion contractures  LOWER EXTREMITY ROM:  Active & Passive ROM Right eval Left eval Right 03/31/23 Left 03/31/23 Right 04/19/23 Left 04/19/23 R 05/11/23 L 05/11/23 R 05/14/23 L 05/14/23  Hip flexion            Hip extension -30 -30  -21 from neutral reclined position on white bolster -23 from neutral reclined position on white bolster     -31 prone -21 supine -28 prone -26 supine  Hip abduction            Hip adduction            Hip internal rotation            Hip external rotation            Knee flexion 120 112          Knee extension -31 -43 -27 -38 -23 -38 -38 -58 -35 -38  Ankle dorsiflexion            Ankle plantarflexion  Ankle inversion            Ankle eversion             (Blank rows = not tested)  LOWER EXTREMITY MMT:  MMT Right eval Left eval R 05/04/23 L 05/04/23  Hip flexion 4+ 4+ 5 5  Hip extension 4 for available ROM 4 for available ROM 4 4+  Hip abduction 4- 4- 4 4  Hip adduction 4 4 4+ 4+  Hip internal rotation 4+ 4+    Hip external rotation 4 4- 4+ 4  Knee flexion 4+ 4+ 4+ 4+  Knee extension 4+ for available ROM 4+ for available ROM 4+ 4+  Ankle dorsiflexion 4+ 4+    Ankle plantarflexion      Ankle inversion      Ankle eversion       (Blank rows = not tested)  FUNCTIONAL TESTS: (03/24/23) 5 times sit to stand: 21.78 sec w/o UE assist (unable to achieve full upright posture due to hip and knee flexion contractures) Timed up and go (TUG): 47.71 sec with RW 10 meter walk test: 30.31 sec with RW; Gait speed = 1.08 ft/sec with RW  BED MOBILITY:  Sit to supine SBA Supine to sit Mod A Rolling to Right Min A Rolling to Left Min A  TRANSFERS: Assistive device utilized: Environmental consultant - 2 wheeled  Sit to stand: SBA Stand to sit: SBA Chair to chair: SBA Floor:  NT  GAIT: Distance walked: 3-4 steps from power wc to treatment table Assistive device utilized: Environmental consultant - 2 wheeled Level of assistance: CGA Gait pattern: step to pattern, decreased stride length, knee flexed in stance- Right, knee flexed in stance- Left, shuffling, antalgic, and trunk flexed Comments: severe crouch in standing/with gait d/t B hip and knee flexion contractures  RAMP: Level of Assistance:  NT Assistive  device utilized:  NT Ramp Comments:   CURB:  Level of Assistance:  NT Assistive device utilized:  NT Curb Comments:   STAIRS:  Level of Assistance:  NT  Stair Negotiation Technique:   Number of Stairs:    Height of Stairs:   Comments:    TODAY'S TREATMENT:  05/14/23 THERAPEUTIC EXERCISE: to improve flexibility, strength and mobility.  Demonstration, verbal and tactile cues throughout for technique.  NuStep - L5 x 6 min Prone lying over 2 pillows with feet off edge of mat table for hip flexor stretch and prone hang knee extension stretch x 5 min - intermittent knee flexion for break from stretches - attempted reducing support to one pillow but unable to tolerate due to too much pressure on her knees Hooklying R/L leg lengtheners with with heels on peanut ball x 10 bil  Hooklying B hip and knee flexion and extension with feet on peanut ball pausing for hip extension isometric into peanut ball at end ROM hip/knee extension prior to measuring knee extension ROM Hooklying alt GTB hip ADB/ER bent-knee fallout 10 x 3", 2 sets S/L GTB clam x 10 bil  THERAPEUTIC ACTIVITIES: Hip and knee ROM assessment  GAIT TRAINING: To normalize gait pattern and improve safety with RW. Gait with RW for all mobility in clinic to day due to absence of power wc with SBA of PT - limited endurance but no LOB or unsteadiness noted   05/11/23 Therapeutic Activity: to improve functional performance. Reviewed WC mobility, use of brakes and steering with patient  Measured knee extension ROM in supine TUG: 35 seconds  Therapeutic Exercise: to improve strength and mobility.  Demo, verbal and tactile cues throughout for technique.  Nustep L6x89min UE/LE   05/06/23 TherEx Nustep L3 with 4 extremities x6 minutes for w/u prior to stretching Transfers S with RW HS stretches 2x60 seconds B sitting PT assisted  Bridges x10 in available ROM STS with hands on knees, forward head and scap retraction when in full  standing, eccentric lower x10  Manual B manual to HS groups side-lying     PATIENT EDUCATION:  Education details:  Plateaus/lack of progress with PT indicating need for discharge Person educated: Patient Education method: Explanation Education comprehension: verbalized understanding  HOME EXERCISE PROGRAM: Access Code: [facility] URL: [email or web address] Date: 05/14/2023 Prepared by: Glenetta Hew  Exercises - Seated Hamstring Stretch  - 2-3 x daily - 7 x weekly - 3 reps - 30 sec hold - Seated Gastroc Stretch with Strap  - 2-3 x daily - 7 x weekly - 3 reps - 30 sec hold - Single Knee to Chest Stretch  - 2-3 x daily - 7 x weekly - 3 reps - 30 sec hold - Seated Long Arc Quad  - 1 x daily - 7 x weekly - 2 sets - 10 reps - Wide Stance with Counter Support  - 1 x daily - 7 x weekly - 1 sets - 5 reps - 1 min hold - Supine Knee Extension Stretch on Towel Roll  - 1 x daily - 7 x weekly - 1 sets - 3 reps - 60 sec  hold - Prone Knee Extension Hang  - 2 x daily - 7 x weekly - 3-5 min hold - Supine Bridge with Resistance Band  - 1 x daily - 7 x weekly - 2 sets - 10 reps - 5 sec hold - Hooklying Single Leg Bent Knee Fallouts with Resistance  - 1 x daily - 7 x weekly - 2 sets - 10 reps - 3 sec hold - Clam with Resistance  - 1 x daily - 7 x weekly - 2 sets - 10 reps - 3-5 sec hold - Lying Prone with 1 Pillow  - 1 x daily - 7 x weekly - 2 sets - 10 reps - 3 sec hold - Prone Hip Extension with Bent Knee - Two Pillows  - 1 x daily - 7 x weekly - 2 sets - 10 reps - 3 sec hold  Patient Education - Trigger Point Dry Needling   ASSESSMENT:  CLINICAL IMPRESSION: Ross reports she has been using her walker and manual wheelchair more because her power wheelchair needs a new battery.  She is walking better with decreased need for external support/guarding but remains in a severely crouched posture due to persistent hip and knee flexion contractures.  Her TUG time has improved by 12  seconds.  No significant change noted in hip or knee extension ROM since eval, although LE strength mildly improved with majority of muscle groups now >/= 4+/5.  Some progress observed towards PT goals with all STG's and LTG's #1 and 8 now met, as well as LTG's #2, 4 and 5 partially met, however remaining goals not met and unlikely to achieve further progress.  Patient reported outcome measures with ABC scale and LEFS indicating worsening perceived functional status.  Given plateau reached with PT and decrease likelihood of further progress, will recommend transition to HEP and discharge from PT. Patient verbalizing understanding.  HEP reviewed and updated to reflect current progression, highlighting need for increased low load-long-duration positional stretching (prone lying) to have a better chance  at reducing hip and knee flexion contractures.  OBJECTIVE IMPAIRMENTS: Abnormal gait, decreased activity tolerance, decreased balance, decreased endurance, decreased knowledge of condition, decreased knowledge of use of DME, decreased mobility, difficulty walking, decreased ROM, decreased strength, decreased safety awareness, hypomobility, increased fascial restrictions, impaired perceived functional ability, increased muscle spasms, impaired flexibility, improper body mechanics, postural dysfunction, and pain.   ACTIVITY LIMITATIONS: carrying, lifting, standing, squatting, sleeping, stairs, transfers, bed mobility, locomotion level, and caring for others  PARTICIPATION LIMITATIONS: meal prep, cleaning, laundry, driving, shopping, community activity, and yard work  PERSONAL FACTORS: Education, Financial risk analyst, Past/current experiences, Time since onset of injury/illness/exacerbation, and 3+ comorbidities: HTN, DM, OSA, fibromyalgia, depression, prediabetes, DOE, obesity, GERD, hypothyroidism, OA of multiple joints (neck, shoulders, elbows, hands, back, hips, knees), multilevel DDD, lumbar radiculopathy, OSA, pituitary  adenoma status post resection  are also affecting patient's functional outcome.   REHAB POTENTIAL: Fair due to severity of contractures and prolonged limited mobility over several years  CLINICAL DECISION MAKING: Evolving/moderate complexity  EVALUATION COMPLEXITY: Moderate   GOALS: Goals reviewed with patient? Yes  SHORT TERM GOALS: Target date: 04/19/2023   Patient will be independent with initial HEP. Baseline: Formal HEP to be established next visit.  Education provided on positional stretching for hip and knee extension in bed on eval. Goal status: MET  04/16/23  2.  Patient will demonstrate >/= 5 degree reduction in B hip and knee flexion contractures. Baseline: Refer to above LE ROM table. Goal status: MET  04/19/23  3.  Patient will be able to ambulate 30+ ft with RW and SBA of PT, demonstrating improved upright posture for more normal gait pattern to increase functional ambulation in home.  Baseline: 3-4 steps with RW in severe crouch with CGA of PT Goal status: MET  03/31/23  LONG TERM GOALS: Target date: 05/17/2023   Patient will be independent with advanced/ongoing HEP to improve outcomes and carryover.  Baseline:  Goal status: MET  05/14/23  2.  Patient will report at least >/= 50% improvement in B hip and knee pain to improve QOL. Baseline: 7/10 Goal status: PARTIALLY MET  05/14/23 - Pt now typically only experiencing pain in L knee and R hip, but pain levels remain 5-6/10  3.  Patient will demonstrate improved B hip and knee AROM to [facility] to allow for normal stance and gait mechanics. Baseline: Refer to above LE ROM table. Goal status: NOT MET  05/11/23 - minimal change since eval  4.  Patient will demonstrate improved B LE strength to >/= 4+/5 for improved stability and ease of mobility. Baseline: Refer to above LE MMT table Goal status: PARTIALLY MET  05/04/23 - Met except B hip abduction, R hip extension and L hip ER  5.  Patient will be able to ambulate 50-75 ft with  RW or LRAD and improved upright posture for more normal gait pattern to increase functional ambulation in home.  Baseline: 3-4 steps with RW in severe crouch due to B hip and knee flexion contracture Goal status: PARTIALLY MET  05/14/23 - Pt able to increase ambulation distance up to ~90 ft with RW and SBA/supervision of PT, but still with significant crouched posture  6.  Patient will report >/= 47/80 on LEFS to demonstrate improved functional ability. Baseline: 38 / 80 = 47.5 % Goal status: NOT MET  05/14/23 - 17 / 80 = 21.3 %  7.  Patient will report >/= 35% on ABC scale to demonstrate improved functional ability and activity confidence with decreased fear  of falling. Baseline: 260 / 1600 = 16.3 % Goal status: NOT MET  05/14/23 - 180 / 1600 = 11.3 %  8.  Patient will demonstrate decreased TUG time by >/= 10 sec to decrease risk for falls with transitional mobility. Baseline: 47.71 sec with RW (03/24/23) Goal status: MET  05/11/23 - 35 sec   PLAN:  PT FREQUENCY: 1-2x/week  PT DURATION: 8 weeks  PLANNED INTERVENTIONS: Therapeutic exercises, Therapeutic activity, Neuromuscular re-education, Balance training, Gait training, Patient/Family education, Self Care, Joint mobilization, Stair training, Aquatic Therapy, Dry Needling, Electrical stimulation, Cryotherapy, Moist heat, Taping, Vasopneumatic device, Ultrasound, Ionotophoresis 4mg /ml Dexamethasone, Manual therapy, and Re-evaluation  PLAN FOR NEXT SESSION: Transition to HEP + DC from PT   PHYSICAL THERAPY DISCHARGE SUMMARY  Visits from Start of Care: 11  Current functional level related to goals / functional outcomes: Refer to above clinical impression and goal assessment.   Remaining deficits: Cayli continues to demonstrate severe B hip and knee flexion contractures which have not significantly changed with PT interventions, causing her to walk with continued severely crouched posture.   Education / Equipment: HEP  Patient agrees  to discharge. Patient goals were partially met. Patient is being discharged due to  reaching a plateau with PT progression.   Marry Guan, PT  05/14/23 1:00 PM  [facility] [address] [place], [place] [address] [phone]  Fax: [phone]

## 2023-05-17 ENCOUNTER — Ambulatory Visit: Payer: MEDICAID | Admitting: Physical Therapy

## 2023-05-18 NOTE — Telephone Encounter (Signed)
I just tried to reach the patient to inquire if she heard from [employer] about switching her wheelchair but her voicemail was full, unable to leave a message.
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
## 2023-05-25 ENCOUNTER — Encounter (HOSPITAL_COMMUNITY): Payer: MEDICAID | Admitting: Psychiatry

## 2023-05-25 ENCOUNTER — Ambulatory Visit (HOSPITAL_COMMUNITY): Payer: MEDICAID | Admitting: Clinical

## 2023-05-31 ENCOUNTER — Ambulatory Visit: Payer: MEDICAID | Admitting: Gastroenterology

## 2023-05-31 ENCOUNTER — Encounter: Payer: Self-pay | Admitting: Gastroenterology

## 2023-05-31 VITALS — BP 126/66 | HR 58 | Temp 97.1°F | Resp 15 | Ht 66.0 in | Wt 258.0 lb

## 2023-05-31 DIAGNOSIS — D122 Benign neoplasm of ascending colon: Secondary | ICD-10-CM

## 2023-05-31 DIAGNOSIS — Z86010 Personal history of colonic polyps: Secondary | ICD-10-CM

## 2023-05-31 DIAGNOSIS — D123 Benign neoplasm of transverse colon: Secondary | ICD-10-CM

## 2023-05-31 DIAGNOSIS — K639 Disease of intestine, unspecified: Secondary | ICD-10-CM

## 2023-05-31 DIAGNOSIS — K219 Gastro-esophageal reflux disease without esophagitis: Secondary | ICD-10-CM

## 2023-05-31 DIAGNOSIS — R1310 Dysphagia, unspecified: Secondary | ICD-10-CM

## 2023-05-31 DIAGNOSIS — Z09 Encounter for follow-up examination after completed treatment for conditions other than malignant neoplasm: Secondary | ICD-10-CM | POA: Diagnosis not present

## 2023-05-31 MED ORDER — SODIUM CHLORIDE 0.9 % IV SOLN: 500.0000 mL | Freq: Once | INTRAVENOUS | Status: DC

## 2023-05-31 NOTE — Patient Instructions (Addendum)
Handouts provided on polyps and diverticulosis.  Resume previous diet.  Continue present medications.  Await pathology results.  Repeat colonoscopy (date not yet determined) for surveillance based on pathology results.  Further recommendations will be based on pathology results of colonic aphtha (ulcers).   YOU HAD AN ENDOSCOPIC PROCEDURE TODAY AT THE [facility]:   Refer to the procedure report that was given to you for any specific questions about what was found during the examination.  If the procedure report does not answer your questions, please call your gastroenterologist to clarify.  If you requested that your care partner not be given the details of your procedure findings, then the procedure report has been included in a sealed envelope for you to review at your convenience later.  YOU SHOULD EXPECT: Some feelings of bloating in the abdomen. Passage of more gas than usual.  Walking can help get rid of the air that was put into your GI tract during the procedure and reduce the bloating. If you had a lower endoscopy (such as a colonoscopy or flexible sigmoidoscopy) you may notice spotting of blood in your stool or on the toilet paper. If you underwent a bowel prep for your procedure, you may not have a normal bowel movement for a few days.  Please Note:  You might notice some irritation and congestion in your nose or some drainage.  This is from the oxygen used during your procedure.  There is no need for concern and it should clear up in a day or so.  SYMPTOMS TO REPORT IMMEDIATELY:  Following lower endoscopy (colonoscopy or flexible sigmoidoscopy):  Excessive amounts of blood in the stool  Significant tenderness or worsening of abdominal pains  Swelling of the abdomen that is new, acute  Fever of 100F or higher  Following upper endoscopy (EGD)  Vomiting of blood or coffee ground material  New chest pain or pain under the shoulder blades  Painful or persistently  difficult swallowing  New shortness of breath  Fever of 100F or higher  Black, tarry-looking stools  For urgent or emergent issues, a gastroenterologist can be reached at any hour by calling [phone]. Do not use MyChart messaging for urgent concerns.    DIET:  We do recommend a small meal at first, but then you may proceed to your regular diet.  Drink plenty of fluids but you should avoid alcoholic beverages for 24 hours.  ACTIVITY:  You should plan to take it easy for the rest of today and you should NOT DRIVE or use heavy machinery until tomorrow (because of the sedation medicines used during the test).    FOLLOW UP: Our staff will call the number listed on your records the next business day following your procedure.  We will call around 7:15- 8:00 am to check on you and address any questions or concerns that you may have regarding the information given to you following your procedure. If we do not reach you, we will leave a message.     If any biopsies were taken you will be contacted by phone or by letter within the next 1-3 weeks.  Please call us at [phone] if you have not heard about the biopsies in 3 weeks.    SIGNATURES/CONFIDENTIALITY: You and/or your care partner have signed paperwork which will be entered into your electronic medical record.  These signatures attest to the fact that that the information above on your After Visit Summary has been reviewed and is understood.  Full responsibility of the confidentiality of this discharge information lies with you and/or your care-partner.

## 2023-05-31 NOTE — Progress Notes (Signed)
Called to room to assist during endoscopic procedure.  Patient ID and intended procedure confirmed with present staff. Received instructions for my participation in the procedure from the performing physician.  

## 2023-05-31 NOTE — Op Note (Signed)
[facility] Patient Name: Robin [name] Procedure Date: 05/31/2023 9:27 AM MRN: [id number] Endoscopist: Lorin Picket E. Tomasa Rand , MD, [id number] Age: 67 Referring MD:  Date of Birth: 1956/02/17 Gender: Female Account #: [id number] Procedure:                Upper GI endoscopy Indications:              Dysphagia Medicines:                Monitored Anesthesia Care Procedure:                Pre-Anesthesia Assessment:                           - Prior to the procedure, a History and Physical                            was performed, and patient medications and                            allergies were reviewed. The patient's tolerance of                            previous anesthesia was also reviewed. The risks                            and benefits of the procedure and the sedation                            options and risks were discussed with the patient.                            All questions were answered, and informed consent                            was obtained. Prior Anticoagulants: The patient has                            taken no anticoagulant or antiplatelet agents. ASA                            Grade Assessment: III - A patient with severe                            systemic disease. After reviewing the risks and                            benefits, the patient was deemed in satisfactory                            condition to undergo the procedure.                           After obtaining informed consent, the endoscope was  passed under direct vision. Throughout the                            procedure, the patient's blood pressure, pulse, and                            oxygen saturations were monitored continuously. The                            Olympus Scope [id number] was introduced through the                            mouth, and advanced to the second part of duodenum.                            The upper GI endoscopy was  accomplished without                            difficulty. The patient tolerated the procedure                            well. Scope In: Scope Out: Findings:                 The examined portions of the nasopharynx,                            oropharynx and larynx were normal.                           The examined esophagus was normal. The scope was                            withdrawn. Dilation was performed with a Maloney                            dilator with no resistance at 56 Fr. The dilation                            site was examined following endoscope reinsertion                            and showed mild mucosal disruption. Estimated blood                            loss was minimal.                           The entire examined stomach was normal.                           The examined duodenum was normal. Complications:            No immediate complications. Estimated Blood Loss:     Estimated blood loss was minimal. Impression:               -  The examined portions of the nasopharynx,                            oropharynx and larynx were normal.                           - Normal esophagus. Dilated.                           - Normal stomach.                           - Normal examined duodenum.                           - No specimens collected. Recommendation:           - Patient has a contact number available for                            emergencies. The signs and symptoms of potential                            delayed complications were discussed with the                            patient. Return to normal activities tomorrow.                            Written discharge instructions were provided to the                            patient.                           - Resume previous diet.                           - Continue present medications.                           - Await pathology results. Slyvia Lartigue E. Tomasa Rand, MD 05/31/2023 10:15:49 AM This report  has been signed electronically.

## 2023-05-31 NOTE — Progress Notes (Signed)
Vss nad trans to pacu 

## 2023-05-31 NOTE — Op Note (Signed)
[facility] Patient Name: Robin [name] Procedure Date: 05/31/2023 9:20 AM MRN: [id number] Endoscopist: Lorin Picket E. 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 The patient's tolerance of                            previous anesthesia was also reviewed. The risks                            and benefits of the procedure and the sedation                            options and risks were discussed with the patient.                            All questions were answered, and informed consent                            was obtained. Prior Anticoagulants: The patient has                            taken no anticoagulant or antiplatelet agents. ASA                            Grade Assessment: III - A patient with severe                            systemic disease. After reviewing the risks and  benefits, the patient was deemed in satisfactory                            condition to undergo the procedure.                           After obtaining informed consent, the colonoscope                            was passed under direct vision. Throughout the                            procedure, the patient's blood pressure, pulse, and                            oxygen saturations were monitored  continuously. The                            CF [id number] #[id number] was introduced through the anus                            and advanced to the the terminal ileum, with                            identification of the appendiceal orifice and IC                            valve. The colonoscopy was somewhat difficult due                            to significant looping. Successful completion of                            the procedure was aided by using manual pressure.                            The patient tolerated the procedure well. The                            quality of the bowel preparation was adequate. The                            terminal ileum, ileocecal valve, appendiceal                            orifice, and rectum were photographed. The bowel                            preparation used was SUPREP via split dose                            instruction. Scope In: 9:38:07 AM Scope Out: 10:09:04 AM Scope Withdrawal Time: 0 hours 23 minutes 20 seconds  Total Procedure Duration: 0 hours 30  minutes 57 seconds  Findings:                 The perianal and digital rectal examinations were                            normal. Pertinent negatives include normal                            sphincter tone and no palpable rectal lesions.                           An 18 mm polyp was found in the ascending colon.                            The polyp was sessile. The polyp was removed with a                            piecemeal technique using a cold snare. Resection                            and retrieval were complete. Estimated blood loss                            was minimal.                           Three sessile polyps were found in the transverse                            colon. The polyps were 3 to 8 mm in size. These                            polyps were removed with a cold snare. Resection                            and retrieval were complete. Estimated blood loss                             was minimal.                           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 Biopsies were taken with a cold forceps  for histology. Estimated blood loss was minimal.                           The exam was otherwise normal throughout the                            examined colon.                           The terminal ileum appeared normal.                           The retroflexed view of the distal rectum and anal                            verge was normal and showed no anal or rectal                            abnormalities. Complications:            No immediate complications. Estimated Blood Loss:     Estimated blood loss was minimal. Impression:               - One 18 mm polyp in the ascending colon, removed                            piecemeal using a cold snare. Resected and                            retrieved.                           - Three 3 to 8 mm polyps in the transverse colon,                            removed with a cold snare. Resected and retrieved.                           - Aphtha in the sigmoid colon, in the descending                            colon, in the transverse colon, in the ascending                            colon and in the cecum. Biopsied. Differential for                            scattered aphtha includes medication-induced                            colopathy (NSAIDs) and Crohn's disease.                           - The examined portion of the ileum was normal.                           -  The distal rectum and anal verge are normal on                            retroflexion view. Recommendation:           - Patient has a contact number available for                            emergencies. The signs  and symptoms of potential                            delayed complications were discussed with the                            patient. Return to normal activities tomorrow.                            Written discharge instructions were provided to the                            patient.                           - Resume previous diet.                           - Continue present medications.                           - Await pathology results.                           - Repeat colonoscopy (date not yet determined) for                            surveillance based on pathology results.                           - Further recommendations will be based on                            pathology results of colonic aphtha. Azael Ragain E. Tomasa Rand, MD 05/31/2023 10:26:19 AM This report has been signed electronically.

## 2023-05-31 NOTE — Progress Notes (Signed)
Robin [name] History and Physical   Primary Care Physician:  Marcine Matar, MD   Reason for Procedure:   Colon cancer screening/polyp surveillance, dysphagia  Plan:    Colonoscopy, EGD with dilation     HPI: Robin [name] is a 67 y.o. female undergoing surveillance colonoscopy and repeat EGD with dilation for recurrent dysphagia.  Her last colonoscopy was in March 2021 and 4 small sessile serrated polyps were removed (4-9 mm).  She has a history of chronic dysphagia and has undergone multiple EGDs with dilations in the past.  She last underwent EGD in 2019 and was empirically dilated with a 56Fr Maloney with improvement in her dysphagia.  Her dysphagia symptoms have returned in the past year.  She has chronic GERD symptoms which are mostly well controlled with omeprazole twice daily.  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   Anemia    Anxiety disorder    Arthritis    Asthma    Back pain    Chronic neck pain    Colon polyps    Constipation    Depression    Diabetes mellitus without complication (HCC)    Dyspnea    Edema of lower extremity    Fibromyalgia    GERD (gastroesophageal reflux disease)    Heartburn    Hiatal hernia    small   Hyperlipidemia    Hypertension    Hypothyroidism    Joint pain    Morbid obesity with BMI of 50.0-59.9, adult (HCC)    Osteoarthritis    Other fatigue    Schatzki's ring    non critical   Severe episode of recurrent major depressive disorder, with psychotic features (HCC) 05/26/2018   Sinusitis    Sleep apnea    CPAP, Sleep study at [facility]   SOB (shortness of breath) on exertion    Status post dilation of esophageal narrowing    Swallowing difficulty     Past Surgical History:  Procedure Laterality Date   ABDOMINAL HYSTERECTOMY  02/18/2000   CARDIAC CATHETERIZATION  08/18/2003   normal L main/LAD/L Cfx/RCA (Dr. Erlene Quan)   COLONOSCOPY  2006   Dr. Franky Macho, hyperplastic polyps   COLONOSCOPY  08/06/2011   abnormal terminal ileum for 10cm, erosions, geographical ulceration. Bx small bowel mucosa with prominent intramucosal lymphoid aggregates, slightly inflammed   COLONOSCOPY WITH PROPOFOL N/A 12/28/2019   Rourk: 4 sessile serrated adenomas removed on the colon.  Next colonoscopy in 3 years.   DIRECT LARYNGOSCOPY  03/15/2012   Procedure: DIRECT LARYNGOSCOPY;  Surgeon: Flo Shanks, MD;  Location: [facility] OR;  Service: ENT;  Laterality: N/A; Dr. Wolicki--:>no foreign body seen. normal esophagus to 40cm   ESOPHAGEAL DILATION  04/17/2015   Procedure: ESOPHAGEAL DILATION;  Surgeon: Corbin Ade, MD;  Location: [facility];  Service: Endoscopy;;   ESOPHAGOGASTRODUODENOSCOPY  08/23/2008   RUE:AVWU distal esophageal erosions consistent with mild erosive reflux esophagitis, otherwise unremarkable esophagus/ Tiny antral erosions of doubtful clinical significance, otherwise normal stomach, patent pylorus, normal D1 and D2   ESOPHAGOGASTRODUODENOSCOPY  08/06/2011   small hh, noncritical Schatzki's ring s/p 56 F   ESOPHAGOGASTRODUODENOSCOPY N/A 04/17/2015   [id number] s/p dilation   ESOPHAGOGASTRODUODENOSCOPY (EGD) WITH PROPOFOL N/A 01/20/2018   Dr. Jena Gauss: Erosive gastropathy, normal-appearing esophagus status post empiric dilation.  Chronic gastritis, no H. pylori.   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 Authorizing Provider  Accu-Chek Softclix Lancets lancets Use to check sugar once daily. E11.69 03/25/23  Yes Marcine Matar, MD  amLODipine (NORVASC) 10 MG tablet Take 1 tablet by mouth daily.   Yes [provider]  aspirin EC 81 MG tablet Take 81 mg by mouth at bedtime.   Yes [provider]  atorvastatin (LIPITOR) 40 MG tablet Take 1 tablet (40 mg total) by mouth every evening. 03/15/23  Yes Marcine Matar, MD  Blood Glucose Monitoring Suppl (ACCU-CHEK GUIDE) w/Device KIT Use to check sugar once daily. E11.69 03/25/23  Yes Marcine Matar, MD  buPROPion (WELLBUTRIN XL) 300 MG 24 hr tablet Take 1 tablet (300 mg total) by mouth daily. 03/18/23 06/16/23 Yes Bahraini, Sarah A  celecoxib (CELEBREX) 100 MG capsule TAKE ONE CAPSULE BY MOUTH TWICE DAILY 12/10/22  Yes Marcine Matar, MD  CINNAMON PO Take 1 capsule by mouth daily.   Yes [provider]  DULoxetine (CYMBALTA) 60 MG capsule Take 1 capsule (60 mg total) by mouth 2 (two) times daily. 03/18/23 06/16/23 Yes Bahraini, Sarah A  esomeprazole (NEXIUM) 40 MG capsule Take 1 capsule (40 mg total) by mouth 2 (two) times daily. 03/15/23  Yes Marcine Matar, MD  gabapentin (NEURONTIN) 600 MG tablet TAKE ONE TABLET BY MOUTH THREE TIMES DAILY 04/12/23  Yes Marcine Matar, MD  glucose blood (ACCU-CHEK GUIDE) test strip Use to check sugar once daily. E11.69 03/25/23  Yes Marcine Matar, MD  hydrALAZINE (APRESOLINE) 50 MG tablet TAKE ONE TABLET BY MOUTH EVERY MORNING and TAKE ONE TABLET BY MOUTH EVERY EVENING 11/12/22  Yes Marcine Matar, MD  hydrochlorothiazide (HYDRODIURIL) 25 MG tablet TAKE ONE TABLET BY MOUTH EVERY MORNING 12/10/22  Yes Marcine Matar, MD  levothyroxine (SYNTHROID) 88 MCG tablet Take 1 tablet by mouth daily.   Yes [provider]  LINZESS 290 MCG CAPS capsule TAKE ONE CAPSULE BY MOUTH BEFORE BREAKFAST 04/27/23  Yes Jenel Lucks, MD  losartan (COZAAR) 25 MG tablet Take 1 tablet (25 mg total) by mouth every evening. 03/15/23  Yes Marcine Matar, MD  meloxicam (MOBIC) 15 MG tablet    Yes [provider]  metFORMIN (GLUCOPHAGE) 500 MG tablet TAKE TWO TABLETS BY MOUTH EVERY MORNING and TAKE ONE TABLET BY MOUTH EVERY EVENING 11/12/22  Yes Marcine Matar, MD  metoprolol tartrate (LOPRESSOR) 25 MG tablet Take 1 tablet (25  mg total) by mouth 2 (two) times daily. 03/15/23  Yes Marcine Matar, MD  Multiple Vitamin (MULTIVITAMIN WITH MINERALS) TABS tablet Take 1 tablet by mouth daily.   Yes [provider]  Omega-3 Fatty Acids (FISH OIL) 1000 MG CAPS Take 1,000 mg by mouth at bedtime.   Yes [provider]  topiramate (TOPAMAX) 50 MG tablet TAKE ONE TABLET BY MOUTH EVERY MORNING and TAKE TWO TABLETS EVERY EVENING 05/17/23  Yes Corinna Capra A, DO  vitamin B-12 (CYANOCOBALAMIN) 1000 MCG tablet Take 1,000 mcg by mouth daily.   Yes [provider]  albuterol (VENTOLIN HFA) 108 (90 Base) MCG/ACT inhaler INHALE 1-2 PUFFS BY MOUTH into THE lungs EVERY 6 HOURS AS NEEDED FOR WHEEZING AND/OR SHORTNESS OF BREATH 03/15/23   Marcine Matar, MD  azelastine (ASTELIN) 0.1 % nasal spray 1-2 sprays per nostril 2 times daily as needed 06/17/20   Bobbitt, Heywood Iles, MD  CALCIUM CARBONATE ANTACID PO Take 2 tablets by mouth See admin instructions. Take 2 tablets by mouth every morning, may also take 2 tablets at night as  needed for acid reflux Patient not taking: Reported on 05/31/2023    [provider]  Dulaglutide (TRULICITY) 4.5 MG/0.5ML SOPN Inject 4.5 mg as directed once a week. 05/13/23   Corinna Capra A, DO  famotidine (PEPCID) 20 MG tablet Take 1 tablet (20 mg total) by mouth 2 (two) times daily. Take with meals Patient not taking: Reported on 05/31/2023 02/23/23   Esterwood, Amy S, PA-C  fexofenadine (ALLEGRA) 180 MG tablet Take 1 tablet (180 mg total) by mouth daily. 07/10/22   Marcine Matar, MD  fluticasone (FLONASE) 50 MCG/ACT nasal spray Place 1 spray into both nostrils daily. 11/22/19   Dahlia Byes A, NP  montelukast (SINGULAIR) 10 MG tablet TAKE ONE TABLET BY MOUTH EVERY EVENING Patient not taking: Reported on 05/31/2023 03/02/23   Marcine Matar, MD  [facility] 4 MG/0.1ML LIQD nasal spray kit  08/16/20   [provider]  NEOMYCIN-POLYMYXIN-HYDROCORTISONE (CORTISPORIN) 1 % SOLN OTIC solution Place 3 drops into the left ear 4 (four) times daily. Patient not taking: Reported on 05/31/2023 03/12/22   Marcine Matar, MD  ondansetron (ZOFRAN-ODT) 8 MG disintegrating tablet Take 1 tablet (8 mg total) by mouth every 8 (eight) hours as needed for nausea or vomiting. 03/04/23   Mayers, Cari S, PA-C  Sodium Chloride-Sodium Bicarb (NETI POT SINUS WASH) 2300-700 MG KIT DO nasal rinse twice a week as needed 03/10/22   Marcine Matar, MD  spironolactone (ALDACTONE) 25 MG tablet Take 1 tablet (25 mg total) by mouth every morning. Patient not taking: Reported on 05/31/2023 03/15/23   Marcine Matar, MD  tiZANidine (ZANAFLEX) 2 MG tablet Take by mouth. 01/09/21   [provider]  amitriptyline (ELAVIL) 50 MG tablet TAKE 1 TABLET BY MOUTH AT BEDTIME. 10/15/20 11/29/20  Marcine Matar, MD    Current Outpatient Medications  Medication Sig Dispense Refill   Accu-Chek Softclix Lancets lancets Use to check sugar once daily. E11.69 100 each 2   amLODipine (NORVASC) 10 MG tablet Take 1 tablet  by mouth daily.     aspirin EC 81 MG tablet Take 81 mg by mouth at bedtime.     atorvastatin (LIPITOR) 40 MG tablet Take 1 tablet (40 mg total) by mouth every evening. 90 tablet 1   Blood Glucose Monitoring Suppl (ACCU-CHEK GUIDE) w/Device KIT Use to check sugar once daily. E11.69 1 kit 0   buPROPion (  WELLBUTRIN XL) 300 MG 24 hr tablet Take 1 tablet (300 mg total) by mouth daily. 30 tablet 2   celecoxib (CELEBREX) 100 MG capsule TAKE ONE CAPSULE BY MOUTH TWICE DAILY 180 capsule 3   CINNAMON PO Take 1 capsule by mouth daily.     DULoxetine (CYMBALTA) 60 MG capsule Take 1 capsule (60 mg total) by mouth 2 (two) times daily. 60 capsule 2   esomeprazole (NEXIUM) 40 MG capsule Take 1 capsule (40 mg total) by mouth 2 (two) times daily. 60 capsule 3   gabapentin (NEURONTIN) 600 MG tablet TAKE ONE TABLET BY MOUTH THREE TIMES DAILY 270 tablet 1   glucose blood (ACCU-CHEK GUIDE) test strip Use to check sugar once daily. E11.69 100 each 2   hydrALAZINE (APRESOLINE) 50 MG tablet TAKE ONE TABLET BY MOUTH EVERY MORNING and TAKE ONE TABLET BY MOUTH EVERY EVENING 180 tablet 3   hydrochlorothiazide (HYDRODIURIL) 25 MG tablet TAKE ONE TABLET BY MOUTH EVERY MORNING 90 tablet 3   levothyroxine (SYNTHROID) 88 MCG tablet Take 1 tablet by mouth daily.     LINZESS 290 MCG CAPS capsule TAKE ONE CAPSULE BY MOUTH BEFORE BREAKFAST 90 capsule 3   losartan (COZAAR) 25 MG tablet Take 1 tablet (25 mg total) by mouth every evening. 90 tablet 2   meloxicam (MOBIC) 15 MG tablet      metFORMIN (GLUCOPHAGE) 500 MG tablet TAKE TWO TABLETS BY MOUTH EVERY MORNING and TAKE ONE TABLET BY MOUTH EVERY EVENING 270 tablet 3   metoprolol tartrate (LOPRESSOR) 25 MG tablet Take 1 tablet (25 mg total) by mouth 2 (two) times daily. 180 tablet 1   Multiple Vitamin (MULTIVITAMIN WITH MINERALS) TABS tablet Take 1 tablet by mouth daily.     Omega-3 Fatty Acids (FISH OIL) 1000 MG CAPS Take 1,000 mg by mouth at bedtime.     topiramate (TOPAMAX) 50 MG  tablet TAKE ONE TABLET BY MOUTH EVERY MORNING and TAKE TWO TABLETS EVERY EVENING 90 tablet 0   vitamin B-12 (CYANOCOBALAMIN) 1000 MCG tablet Take 1,000 mcg by mouth daily.     albuterol (VENTOLIN HFA) 108 (90 Base) MCG/ACT inhaler INHALE 1-2 PUFFS BY MOUTH into THE lungs EVERY 6 HOURS AS NEEDED FOR WHEEZING AND/OR SHORTNESS OF BREATH 18 g 6   azelastine (ASTELIN) 0.1 % nasal spray 1-2 sprays per nostril 2 times daily as needed 30 mL 12   CALCIUM CARBONATE ANTACID PO Take 2 tablets by mouth See admin instructions. Take 2 tablets by mouth every morning, may also take 2 tablets at night as needed for acid reflux (Patient not taking: Reported on 05/31/2023)     Dulaglutide (TRULICITY) 4.5 MG/0.5ML SOPN Inject 4.5 mg as directed once a week. 2 mL 0   famotidine (PEPCID) 20 MG tablet Take 1 tablet (20 mg total) by mouth 2 (two) times daily. Take with meals (Patient not taking: Reported on 05/31/2023) 60 tablet 3   fexofenadine (ALLEGRA) 180 MG tablet Take 1 tablet (180 mg total) by mouth daily. 30 tablet 0   fluticasone (FLONASE) 50 MCG/ACT nasal spray Place 1 spray into both nostrils daily. 16 g 1   montelukast (SINGULAIR) 10 MG tablet TAKE ONE TABLET BY MOUTH EVERY EVENING (Patient not taking: Reported on 05/31/2023) 90 tablet 0   NARCAN 4 MG/0.1ML LIQD nasal spray kit      NEOMYCIN-POLYMYXIN-HYDROCORTISONE (CORTISPORIN) 1 % SOLN OTIC solution Place 3 drops into the left ear 4 (four) times daily. (Patient not taking: Reported on 05/31/2023) 10 mL 0  ondansetron (ZOFRAN-ODT) 8 MG disintegrating tablet Take 1 tablet (8 mg total) by mouth every 8 (eight) hours as needed for nausea or vomiting. 20 tablet 0   Sodium Chloride-Sodium Bicarb (NETI POT SINUS WASH) 2300-700 MG KIT DO nasal rinse twice a week as needed 1 kit 0   spironolactone (ALDACTONE) 25 MG tablet Take 1 tablet (25 mg total) by mouth every morning. (Patient not taking: Reported on 05/31/2023) 90 tablet 1   tiZANidine (ZANAFLEX) 2 MG tablet Take by  mouth.     Current Facility-Administered Medications  Medication Dose Route Frequency Provider Last Rate Last Admin   0.9 %  sodium chloride infusion  500 mL Intravenous Once Jenel Lucks, MD        Allergies as of 05/31/2023 - Review Complete 05/31/2023  Allergen Reaction Noted   Norvasc [amlodipine besylate] Other (See Comments) 05/03/2018   Celexa [citalopram hydrobromide] Other (See Comments) 05/28/2017   Diflucan [fluconazole] Rash 02/08/2018    Family History  Problem Relation Age of Onset   Diabetes Mother    Hypertension Mother    Heart Problems Mother    Hyperlipidemia Mother    Asthma Mother    Sleep apnea Mother    Obesity Mother    Colon polyps Mother    Diabetes Father    Kidney cancer Father    Hypertension Father    Hyperlipidemia Father    Sleep apnea Father    Hypertension Sister    Allergic rhinitis Sister    Hyperlipidemia Sister    Liver disease Brother    Arthritis Brother    Hypertension Brother    Hyperlipidemia Brother    Hypertension Brother    Hyperlipidemia Brother    Lupus Maternal Aunt    Breast cancer Maternal Grandmother    Kidney disease Maternal Grandfather    Alcoholism Maternal Grandfather    Breast cancer Paternal Grandmother    Hypertension Daughter    Hyperlipidemia Daughter    Eczema Daughter    Hypertension Son    Stomach cancer Other        aunt   Colon cancer Neg Hx    Immunodeficiency Neg Hx    Urticaria Neg Hx    Prostate cancer Neg Hx    Rectal cancer Neg Hx    Esophageal cancer Neg Hx     Social History   Socioeconomic History   Marital status: Married    Spouse name: Not on file   Number of children: 4   Years of education: 10   Highest education level: Not on file  Occupational History   Occupation: Disabled  Tobacco Use   Smoking status: Former    Current packs/day: 0.00    Average packs/day: 0.5 packs/day for 17.7 years (8.9 ttl pk-yrs)    Types: Cigarettes    Start date: 07/14/1975     Quit date: 04/04/1993    Years since quitting: 30.1   Smokeless tobacco: Never   Tobacco comments:    quit 20 yrs ago  Vaping Use   Vaping status: Never Used  Substance and Sexual Activity   Alcohol use: No    Alcohol/week: 0.0 standard drinks of alcohol   Drug use: No   Sexual activity: Yes  Other Topics Concern   Not on file  Social History Narrative   Lives with husband in an apartment on the first floor.  Has 4 children.     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  Trying to get disability.  Formerly worked as a Surveyor, mining.  Education: 11th grade.      [facility] (12/23/16):   Originally from [facility] [place] [facility]. She was raised in [place]. 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 No pets currently. No bird exposure. She does have mold in her current home in the bathroom, laundry room, & master bedroom.    Social Determinants of Health   Financial Resource Strain: Low Risk  (01/22/2022)   Received from [facility]   Overall Financial Resource Strain (CARDIA)    Difficulty of Paying Living Expenses: Not hard at all  Food Insecurity: No Food Insecurity (01/22/2022)   Received from [name]   Hunger Vital Sign    Worried About Running Out of Food in the Last Year: Never true    Ran Out of Food in the Last Year: Never true  Transportation Needs: Unmet Transportation Needs (10/15/2020)   PRAPARE - Administrator, Civil Service (Medical): Yes    Lack of Transportation (Non-Medical): No  Physical Activity: Unknown (01/22/2022)   Received from [facility]   Exercise Vital Sign    Days of Exercise per Week: 3 days    Minutes of Exercise per Session: Not on file  Stress: Stress Concern Present (01/22/2022)   Received from [facility] - Occupational Stress Questionnaire     Feeling of Stress : To some extent  Social Connections: Unknown (01/28/2023)   Received from [facility]   Social Network    Social Network: Not on file  Intimate Partner Violence: Unknown (01/28/2023)   Received from [facility]   HITS    Physically Hurt: Not on file    Insult or Talk Down To: Not on file    Threaten Physical Harm: Not on file    Scream or Curse: Not on file    Review of Systems:  All other review of systems negative except as mentioned in the HPI.  Physical Exam: Vital signs BP 133/76   Pulse (!) 55   Temp (!) 97.1 F (36.2 C)   Ht 5\' 6"  (1.676 m)   Wt 258 lb (117 kg)   SpO2 100%   BMI 41.64 kg/m   General:   Alert,  Well-developed, well-nourished, pleasant and cooperative in NAD Airway:  Mallampati 2 Lungs:  Clear throughout to auscultation.   Heart:  Regular rate and rhythm; no murmurs, clicks, rubs,  or gallops. Abdomen:  Soft, nontender and nondistended. Normal bowel sounds.   Neuro/Psych:  Normal mood and affect. A and O x 3   Robin [name] E. Tomasa Rand, MD [name]

## 2023-06-01 ENCOUNTER — Other Ambulatory Visit: Payer: Self-pay | Admitting: Internal Medicine

## 2023-06-01 ENCOUNTER — Other Ambulatory Visit (HOSPITAL_COMMUNITY): Payer: Self-pay | Admitting: Psychiatry

## 2023-06-01 ENCOUNTER — Telehealth: Payer: Self-pay | Admitting: *Deleted

## 2023-06-01 DIAGNOSIS — E1159 Type 2 diabetes mellitus with other circulatory complications: Secondary | ICD-10-CM

## 2023-06-01 DIAGNOSIS — F341 Dysthymic disorder: Secondary | ICD-10-CM

## 2023-06-01 NOTE — Telephone Encounter (Signed)
  Follow up Call-     05/31/2023    8:33 AM  Call back number  Post procedure Call Back phone  # [phone]  Permission to leave phone message Yes     Patient questions:  Do you have a fever, pain , or abdominal swelling? No. Pain Score  0 *  Have you tolerated food without any problems? Yes.    Have you been able to return to your normal activities? Yes.    Do you have any questions about your discharge instructions: Diet   No. Medications  No. Follow up visit  No.  Do you have questions or concerns about your Care? No.  Actions: * If pain score is 4 or above: No action needed, pain <4.

## 2023-06-02 ENCOUNTER — Encounter: Payer: Self-pay | Admitting: Podiatry

## 2023-06-02 ENCOUNTER — Ambulatory Visit (INDEPENDENT_AMBULATORY_CARE_PROVIDER_SITE_OTHER): Payer: MEDICAID | Admitting: Podiatry

## 2023-06-02 DIAGNOSIS — M79675 Pain in left toe(s): Secondary | ICD-10-CM

## 2023-06-02 DIAGNOSIS — M2141 Flat foot [pes planus] (acquired), right foot: Secondary | ICD-10-CM

## 2023-06-02 DIAGNOSIS — B351 Tinea unguium: Secondary | ICD-10-CM | POA: Diagnosis not present

## 2023-06-02 DIAGNOSIS — M2142 Flat foot [pes planus] (acquired), left foot: Secondary | ICD-10-CM

## 2023-06-02 DIAGNOSIS — M79674 Pain in right toe(s): Secondary | ICD-10-CM | POA: Diagnosis not present

## 2023-06-02 DIAGNOSIS — E1142 Type 2 diabetes mellitus with diabetic polyneuropathy: Secondary | ICD-10-CM | POA: Diagnosis not present

## 2023-06-02 DIAGNOSIS — E119 Type 2 diabetes mellitus without complications: Secondary | ICD-10-CM

## 2023-06-03 DIAGNOSIS — Z86010 Personal history of colonic polyps: Secondary | ICD-10-CM

## 2023-06-03 DIAGNOSIS — K639 Disease of intestine, unspecified: Secondary | ICD-10-CM

## 2023-06-03 DIAGNOSIS — D122 Benign neoplasm of ascending colon: Secondary | ICD-10-CM

## 2023-06-03 DIAGNOSIS — D123 Benign neoplasm of transverse colon: Secondary | ICD-10-CM

## 2023-06-03 DIAGNOSIS — R1310 Dysphagia, unspecified: Secondary | ICD-10-CM

## 2023-06-03 DIAGNOSIS — K219 Gastro-esophageal reflux disease without esophagitis: Secondary | ICD-10-CM

## 2023-06-05 ENCOUNTER — Other Ambulatory Visit (HOSPITAL_COMMUNITY): Payer: Self-pay | Admitting: Psychiatry

## 2023-06-05 ENCOUNTER — Other Ambulatory Visit: Payer: Self-pay | Admitting: Bariatrics

## 2023-06-05 ENCOUNTER — Other Ambulatory Visit: Payer: Self-pay | Admitting: Gastroenterology

## 2023-06-05 ENCOUNTER — Other Ambulatory Visit: Payer: Self-pay | Admitting: Internal Medicine

## 2023-06-05 DIAGNOSIS — J309 Allergic rhinitis, unspecified: Secondary | ICD-10-CM

## 2023-06-05 DIAGNOSIS — E1169 Type 2 diabetes mellitus with other specified complication: Secondary | ICD-10-CM

## 2023-06-05 DIAGNOSIS — I152 Hypertension secondary to endocrine disorders: Secondary | ICD-10-CM

## 2023-06-05 DIAGNOSIS — E1159 Type 2 diabetes mellitus with other circulatory complications: Secondary | ICD-10-CM

## 2023-06-05 DIAGNOSIS — F341 Dysthymic disorder: Secondary | ICD-10-CM

## 2023-06-05 NOTE — Progress Notes (Signed)
ANNUAL DIABETIC FOOT EXAM  Subjective: Robin [name] presents today annual diabetic foot exam. She is accompanied by her husband on today's visit. Chief Complaint  Patient presents with   Nail Problem    DFC,Referring Provider Marcine Matar, MD,lov:08/24,A1C:5.4,BS:91     Patient confirms h/o diabetes.  Patient denies any h/o foot wounds.  Patient has been diagnosed with neuropathy.  Risk factors: diabetes, diabetic neuropathy, chronic lower extremity edema, HTN, hyperlipidemia, h/o tobacco use in remission.  Marcine Matar, MD is patient's PCP.  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   Anemia    Anxiety disorder    Arthritis    Asthma    Back pain    Chronic neck pain    Colon polyps    Constipation    Depression    Diabetes mellitus without complication (HCC)    Dyspnea    Edema of lower extremity    Fibromyalgia    GERD (gastroesophageal reflux disease)    Heartburn    Hiatal hernia    small   Hyperlipidemia    Hypertension    Hypothyroidism    Joint pain    Morbid obesity with BMI of 50.0-59.9, adult (HCC)    Osteoarthritis    Other fatigue    Schatzki's ring    non critical   Severe episode of recurrent major depressive disorder, with psychotic features (HCC) 05/26/2018   Sinusitis    Sleep apnea    CPAP, Sleep study at [facility]   SOB (shortness of breath) on exertion    Status post dilation of esophageal narrowing    Swallowing difficulty    Patient Active Problem List   Diagnosis Date Noted   Type 2 diabetes mellitus with obesity (HCC) 02/04/2023   Moderate major depression (HCC) 11/12/2022   BMI 40.0-44.9, adult (HCC) 11/03/2022   B12 deficiency 10/06/2022   Depression 07/09/2022   Class 3 severe obesity with serious comorbidity and body mass index (BMI) of 45.0 to 49.9 in adult (HCC) 07/09/2022   Major depressive disorder, recurrent  episode (HCC) 07/02/2022   Generalized anxiety disorder 07/02/2022   Body mass index 40.0-44.9, adult (HCC) 02/25/2022   Morbid obesity (HCC) 02/25/2022   Right knee pain 02/18/2022   Diabetes mellitus type 2 in obese 02/18/2022   Lumbar foraminal stenosis 01/23/2022   Lumbar radiculopathy 01/23/2022   Pain in both knees 01/23/2022   Vitamin D deficiency 11/18/2021   Polyp of descending colon 02/15/2020   Pituitary tumor 08/21/2019   Primary osteoarthritis of both hips 08/21/2019   Primary osteoarthritis of both knees 08/21/2019   Pain in left shoulder 08/21/2019   Morbid obesity with BMI of 60.0-69.9, adult (HCC) 07/17/2019   Pedal edema 07/17/2019   Multilevel degenerative disc disease 10/31/2018   Change in bowel function 10/28/2018   Gastritis and gastroduodenitis 04/26/2018   Atrophic vaginitis 04/01/2018   Cough, persistent 02/08/2018   Dysphagia 12/14/2017   Generalized OA 07/30/2017   Urinary incontinence 07/23/2017   Early satiety 06/16/2017   Hx of iron deficiency anemia 05/28/2017   Throat pain in adult 04/05/2017   Prediabetes 02/04/2017   Dyspnea on exertion 12/23/2016   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 12/23/2016   Hemoptysis 12/23/2016   Fibromyalgia 08/18/2016   Chronic lymphocytic thyroiditis 08/18/2016   Persistent depressive disorder 04/01/2016   OSA (obstructive sleep apnea) 04/01/2016   Essential hypertension 12/09/2015  Hypothyroidism 12/09/2015   Morbid obesity due to excess calories (HCC) 12/09/2015   Constipation 05/27/2015   Fatty liver 05/27/2015   Abdominal pain, chronic, epigastric 07/04/2012   History of gastroesophageal reflux (GERD) 06/09/2012   Hyperlipidemia 06/09/2012   Refusal of blood transfusions as patient is Jehovah's Witness 06/09/2012   Anxiety 06/09/2012   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 Erlene Quan)   COLONOSCOPY  2006   Dr. Franky Macho, hyperplastic polyps   COLONOSCOPY  08/06/2011   abnormal terminal ileum for 10cm, erosions, geographical ulceration. Bx small bowel mucosa with prominent intramucosal lymphoid aggregates, slightly inflammed   COLONOSCOPY WITH PROPOFOL N/A 12/28/2019   Rourk: 4 sessile serrated adenomas removed on the colon.  Next colonoscopy in 3 years.   DIRECT LARYNGOSCOPY  03/15/2012   Procedure: DIRECT LARYNGOSCOPY;  Surgeon: Flo Shanks, MD;  Location: [facility] OR;  Service: ENT;  Laterality: N/A; Dr. Wolicki--:>no foreign body seen. normal esophagus to 40cm   ESOPHAGEAL DILATION  04/17/2015   Procedure: ESOPHAGEAL DILATION;  Surgeon: Corbin Ade, MD;  Location: [facility];  Service: Endoscopy;;   ESOPHAGOGASTRODUODENOSCOPY  08/23/2008   QMV:HQIO distal esophageal erosions consistent with mild erosive reflux esophagitis, otherwise unremarkable esophagus/ Tiny antral erosions of doubtful clinical significance, otherwise normal stomach, patent pylorus, normal D1 and D2   ESOPHAGOGASTRODUODENOSCOPY  08/06/2011   small hh, noncritical Schatzki's ring s/p 58 F   ESOPHAGOGASTRODUODENOSCOPY N/A 04/17/2015   NGE:XBMWUX s/p dilation   ESOPHAGOGASTRODUODENOSCOPY (EGD) WITH PROPOFOL N/A 01/20/2018   Dr. Jena Gauss: Erosive gastropathy, normal-appearing esophagus status post empiric dilation.  Chronic gastritis, no H. pylori.   ESOPHAGOSCOPY  03/15/2012   Procedure: ESOPHAGOSCOPY;  Surgeon: Flo Shanks, MD;  Location: [facility] OR;  Service: ENT;  Laterality: N/A;   KNEE ARTHROSCOPY Left 04/27/2001   MALONEY DILATION N/A 01/20/2018   Procedure: Elease Hashimoto DILATION;  Surgeon: Corbin Ade, MD;  Location: [facility];  Service: Endoscopy;  Laterality: N/A;   NM MYOCAR PERF WALL MOTION  2003    negative bruce protocol exercise tress test; EF 68%; intermediate risk study due to evidence of anterior wall ischemia extending from mid-ventricle to apex   PITUITARY SURGERY  06/2012   benign tumor, surgeon at [facility]   POLYPECTOMY  12/28/2019   Procedure: POLYPECTOMY;  Surgeon: Corbin Ade, MD;  Location: [facility];  Service: Endoscopy;;   RECTOCELE REPAIR     TRANSTHORACIC ECHOCARDIOGRAM  2003   EF normal   VAGINAL PROLAPSE REPAIR     VIDEO BRONCHOSCOPY Bilateral 03/08/2017   Procedure: VIDEO BRONCHOSCOPY WITHOUT FLUORO;  Surgeon: Roslynn Amble, MD;  Location[facility] ENDOSCOPY;  Service: Cardiopulmonary;  Laterality: Bilateral;   Current Outpatient Medications on File Prior to Visit  Medication Sig Dispense Refill   Accu-Chek Softclix Lancets lancets Use to check sugar once daily. E11.69 100 each 2   albuterol (VENTOLIN HFA) 108 (90 Base) MCG/ACT inhaler INHALE 1-2 PUFFS BY MOUTH into THE lungs EVERY 6 HOURS AS NEEDED FOR WHEEZING AND/OR SHORTNESS OF BREATH 18 g 6   amLODipine (NORVASC) 10 MG tablet Take 1 tablet by mouth daily.     aspirin EC 81 MG tablet Take 81 mg by mouth at bedtime.     atorvastatin (LIPITOR) 40 MG tablet  Take 1 tablet (40 mg total) by mouth every evening. 90 tablet 1   azelastine (ASTELIN) 0.1 % nasal spray 1-2 sprays per nostril 2 times daily as needed 30 mL 12   Blood Glucose Monitoring Suppl (ACCU-CHEK GUIDE) w/Device KIT Use to check sugar once daily. E11.69 1 kit 0   buPROPion (WELLBUTRIN XL) 300 MG 24 hr tablet TAKE ONE TABLET BY MOUTH ONCE DAILY 30 tablet 2   CALCIUM CARBONATE ANTACID PO Take 2 tablets by mouth See admin instructions. Take 2 tablets by mouth every morning, may also take 2 tablets at night as needed for acid reflux     celecoxib (CELEBREX) 100 MG capsule TAKE ONE CAPSULE BY MOUTH TWICE DAILY 180 capsule 3   CINNAMON PO Take 1 capsule by mouth daily.     Dulaglutide (TRULICITY) 4.5 MG/0.5ML SOPN Inject 4.5 mg as directed once a week.  2 mL 0   DULoxetine (CYMBALTA) 60 MG capsule Take 1 capsule (60 mg total) by mouth 2 (two) times daily. 60 capsule 2   esomeprazole (NEXIUM) 40 MG capsule Take 1 capsule (40 mg total) by mouth 2 (two) times daily. 60 capsule 3   famotidine (PEPCID) 20 MG tablet Take 1 tablet (20 mg total) by mouth 2 (two) times daily. Take with meals 60 tablet 3   fexofenadine (ALLEGRA) 180 MG tablet Take 1 tablet (180 mg total) by mouth daily. 30 tablet 0   fluticasone (FLONASE) 50 MCG/ACT nasal spray Place 1 spray into both nostrils daily. 16 g 1   gabapentin (NEURONTIN) 600 MG tablet TAKE ONE TABLET BY MOUTH THREE TIMES DAILY 270 tablet 1   glucose blood (ACCU-CHEK GUIDE) test strip Use to check sugar once daily. E11.69 100 each 2   hydrALAZINE (APRESOLINE) 50 MG tablet TAKE ONE TABLET BY MOUTH EVERY MORNING and TAKE ONE TABLET BY MOUTH EVERY EVENING 180 tablet 3   hydrochlorothiazide (HYDRODIURIL) 25 MG tablet TAKE ONE TABLET BY MOUTH EVERY MORNING 90 tablet 3   levothyroxine (SYNTHROID) 88 MCG tablet Take 1 tablet by mouth daily.     LINZESS 290 MCG CAPS capsule TAKE ONE CAPSULE BY MOUTH BEFORE BREAKFAST 90 capsule 3   losartan (COZAAR) 25 MG tablet Take 1 tablet (25 mg total) by mouth every evening. 90 tablet 2   meloxicam (MOBIC) 15 MG tablet      metFORMIN (GLUCOPHAGE) 500 MG tablet TAKE TWO TABLETS BY MOUTH EVERY MORNING and TAKE ONE TABLET BY MOUTH EVERY EVENING 270 tablet 3   metoprolol tartrate (LOPRESSOR) 25 MG tablet Take 1 tablet (25 mg total) by mouth 2 (two) times daily. 180 tablet 1   montelukast (SINGULAIR) 10 MG tablet TAKE ONE TABLET BY MOUTH EVERY EVENING 90 tablet 0   Multiple Vitamin (MULTIVITAMIN WITH MINERALS) TABS tablet Take 1 tablet by mouth daily.     NARCAN 4 MG/0.1ML LIQD nasal spray kit      NEOMYCIN-POLYMYXIN-HYDROCORTISONE (CORTISPORIN) 1 % SOLN OTIC solution Place 3 drops into the left ear 4 (four) times daily. 10 mL 0   Omega-3 Fatty Acids (FISH OIL) 1000 MG CAPS Take 1,000 mg  by mouth at bedtime.     ondansetron (ZOFRAN-ODT) 8 MG disintegrating tablet Take 1 tablet (8 mg total) by mouth every 8 (eight) hours as needed for nausea or vomiting. 20 tablet 0   Sodium Chloride-Sodium Bicarb (NETI POT SINUS WASH) 2300-700 MG KIT DO nasal rinse twice a week as needed 1 kit 0   spironolactone (ALDACTONE) 25 MG tablet Take 1  tablet (25 mg total) by mouth every morning. 90 tablet 1   tiZANidine (ZANAFLEX) 2 MG tablet Take by mouth.     topiramate (TOPAMAX) 50 MG tablet TAKE ONE TABLET BY MOUTH EVERY MORNING and TAKE TWO TABLETS EVERY EVENING 90 tablet 0   vitamin B-12 (CYANOCOBALAMIN) 1000 MCG tablet Take 1,000 mcg by mouth daily.     [DISCONTINUED] amitriptyline (ELAVIL) 50 MG tablet TAKE 1 TABLET BY MOUTH AT BEDTIME. 90 tablet 0   No current facility-administered medications on file prior to visit.    Allergies  Allergen Reactions   Norvasc [Amlodipine Besylate] Other (See Comments)    Edema in lower extremities Other reaction(s): Other Edema in lower extremities   Celexa [Citalopram Hydrobromide] Other (See Comments)    "Made me feel out of it"   Diflucan [Fluconazole] Rash   Social History   Occupational History   Occupation: Disabled  Tobacco Use   Smoking status: Former    Current packs/day: 0.00    Average packs/day: 0.5 packs/day for 17.7 years (8.9 ttl pk-yrs)    Types: Cigarettes    Start date: 07/14/1975    Quit date: 04/04/1993    Years since quitting: 30.1   Smokeless tobacco: Never   Tobacco comments:    quit 20 yrs ago  Vaping Use   Vaping status: Never Used  Substance and Sexual Activity   Alcohol use: No    Alcohol/week: 0.0 standard drinks of alcohol   Drug use: No   Sexual activity: Yes   Family History  Problem Relation Age of Onset   Diabetes Mother    Hypertension Mother    Heart Problems Mother    Hyperlipidemia Mother    Asthma Mother    Sleep apnea Mother    Obesity Mother    Colon polyps Mother    Diabetes Father     Kidney cancer Father    Hypertension Father    Hyperlipidemia Father    Sleep apnea Father    Hypertension Sister    Allergic rhinitis Sister    Hyperlipidemia Sister    Liver disease Brother    Arthritis Brother    Hypertension Brother    Hyperlipidemia Brother    Hypertension Brother    Hyperlipidemia Brother    Lupus Maternal Aunt    Breast cancer Maternal Grandmother    Kidney disease Maternal Grandfather    Alcoholism Maternal Grandfather    Breast cancer Paternal Grandmother    Hypertension Daughter    Hyperlipidemia Daughter    Eczema Daughter    Hypertension Son    Stomach cancer Other        aunt   Colon cancer Neg Hx    Immunodeficiency Neg Hx    Urticaria Neg Hx    Prostate cancer Neg Hx    Rectal cancer Neg Hx    Esophageal cancer Neg Hx    Immunization History  Administered Date(s) Administered   Influenza Inj Mdck Quad With Preservative 06/05/2018   Influenza,inj,Quad PF,6+ Mos 08/05/2016, 07/30/2017, 06/09/2018, 07/28/2019, 07/10/2021, 07/10/2022   Influenza-Unspecified 07/25/2013, 09/04/2014, 07/05/2020   PFIZER(Purple Top)SARS-COV-2 Vaccination 01/11/2020, 02/01/2020   Pneumococcal Polysaccharide-23 08/05/2016   Tdap 08/05/2016   Zoster Recombinant(Shingrix) 02/27/2021, 07/25/2021     Review of Systems: Negative except as noted in the HPI.   Objective: There were no vitals filed for this visit.  Robin [name] is a pleasant 67 y.o. female in NAD. AAO X 3.  Vascular Examination: Capillary refill time immediate b/l. Vascular status intact  b/l with palpable pedal pulses. Pedal hair present b/l. No pain with calf compression b/l. Skin temperature gradient WNL b/l. No cyanosis or clubbing b/l. No ischemia or gangrene noted b/l. Trace edema noted BLE.  Neurological Examination: Sensation grossly intact b/l with 10 gram monofilament. Vibratory sensation intact b/l. Pt has subjective symptoms of neuropathy.  Dermatological Examination: Pedal skin with  normal turgor, texture and tone b/l.  No open wounds. No interdigital macerations.   Toenails 1-5 b/l thick, discolored, elongated with subungual debris and pain on dorsal palpation.   No corns, calluses nor porokeratotic lesions noted.  Musculoskeletal Examination: Muscle strength 5/5 to all lower extremity muscle groups bilaterally. Pes planus deformity noted bilateral LE. Utilizes transport chair for mobility assistance.  Radiographs: None  Last A1c:      Latest Ref Rng & Units 03/15/2023   10:09 AM 10/06/2022   10:27 AM 07/10/2022    1:53 PM  Hemoglobin A1C  Hemoglobin-A1c 0.0 - 7.0 % 5.4  5.3  6.3    Lab Results  Component Value Date   HGBA1C 5.4 03/15/2023   ADA Risk Categorization: Low Risk :  Patient has all of the following: Intact protective sensation No prior foot ulcer  No severe deformity Pedal pulses present  Assessment: 1. Pain due to onychomycosis of toenails of both feet   2. Pes planus of both feet   3. Diabetic peripheral neuropathy associated with type 2 diabetes mellitus (HCC)   4. Encounter for diabetic foot exam [facility])     Plan: -Patient was evaluated and treated. All patient's and/or POA's questions/concerns answered on today's visit. -Diabetic foot examination performed today. -Continue diabetic foot care principles: inspect feet daily, monitor glucose as recommended by PCP and/or Endocrinologist, and follow prescribed diet per PCP, Endocrinologist and/or dietician. -Continue supportive shoe gear daily. -Toenails 1-5 b/l were debrided in length and girth with sterile nail nippers without iatrogenic bleeding.  -Patient/POA to call should there be question/concern in the interim. No follow-ups on file.  Freddie Breech, DPM

## 2023-06-08 ENCOUNTER — Other Ambulatory Visit: Payer: Self-pay | Admitting: Physician Assistant

## 2023-06-08 ENCOUNTER — Other Ambulatory Visit: Payer: Self-pay | Admitting: Bariatrics

## 2023-06-08 DIAGNOSIS — F5089 Other specified eating disorder: Secondary | ICD-10-CM

## 2023-06-08 NOTE — Progress Notes (Signed)
Robin [name],  The large polyp that I removed during your recent procedure was completely benign but was proven to be a "pre-cancerous" polyp that MAY have grown into cancers if it had not been removed.  The other smaller polyps that I removed were not precancerous.  Studies shows that at least 20% of women over age 65 and 30% of men over age 55 have pre-cancerous polyps.  Based on current nationally recognized surveillance guidelines, I recommend that you have a repeat colonoscopy in 3 years.   The biopsies of the small lesions scattered throughout your colon were consistent with benign lymphoid tissue.  There was no evidence of Crohn's disease or other inflammatory or cancerous process.  No further evaluation is recommended.

## 2023-06-08 NOTE — Telephone Encounter (Signed)
Requested Prescriptions  Pending Prescriptions Disp Refills   spironolactone (ALDACTONE) 25 MG tablet [Pharmacy Med Name: SPIRONOLACTONE 25 MG TABS 25 Tablet] 90 tablet 0    Sig: TAKE 1 TABLET BY MOUTH EVERY MORNING *REFILL REQUEST*     Cardiovascular: Diuretics - Aldosterone Antagonist Failed - 06/05/2023  8:01 AM      Failed - Cr in normal range and within 180 days    Creat  Date Value Ref Range Status  09/22/2016 0.80 0.50 - 1.05 mg/dL Final    Comment:      For patients > or = 65 years of age: The upper reference limit for Creatinine is approximately 13% higher for people identified as African-American.      Creatinine, Ser  Date Value Ref Range Status  10/06/2022 0.96 0.57 - 1.00 mg/dL Final         Failed - K in normal range and within 180 days    Potassium  Date Value Ref Range Status  10/06/2022 CANCELED mmol/L     Comment:    Test not performed. Due to technical problems in testing, a valid result could not be obtained. The remaining specimen was not sufficient for additional testing.  Result canceled by the ancillary.   06/25/2011 4.2 mmol/L Final         Failed - Na in normal range and within 180 days    Sodium  Date Value Ref Range Status  10/06/2022 CANCELED mmol/L     Comment:    Test not performed. Due to technical problems in testing, a valid result could not be obtained. The remaining specimen was not sufficient for additional testing.  Result canceled by the ancillary.          Failed - eGFR is 30 or above and within 180 days    GFR, Est African American  Date Value Ref Range Status  09/22/2016 >89 >=60 mL/min Final   GFR calc Af Amer  Date Value Ref Range Status  12/26/2019 >60 >60 mL/min Final   GFR, Est Non African American  Date Value Ref Range Status  09/22/2016 82 >=60 mL/min Final   GFR, Estimated  Date Value Ref Range Status  01/04/2021 >60 >60 mL/min Final    Comment:    (NOTE) Calculated using the CKD-EPI Creatinine  Equation (2021)    GFR  Date Value Ref Range Status  03/08/2018 87.84 >60.00 mL/min Final   eGFR  Date Value Ref Range Status  10/06/2022 66 >59 mL/min/1.73 Final         Passed - Last BP in normal range    BP Readings from Last 1 Encounters:  05/31/23 126/66         Passed - Valid encounter within last 6 months    Recent Outpatient Visits           2 months ago Type 2 diabetes mellitus with morbid obesity (HCC)   [place] [facility] Jonah Blue B, MD   6 months ago Type 2 diabetes mellitus with morbid obesity [facility])   [place] [facility] Blue B, MD   11 months ago Type 2 diabetes mellitus with morbid obesity [facility])   [place] [facility] Jon[facility] Blue B, MD   1 year ago Type 2 diabetes mellitus with morbid obesity [facility])   [place] [facility] Jonah Blue B, MD   1 year ago Mixed stress and urge urinary incontinence  [facility] Marcine Matar, MD       Future Appointments             In 2 weeks Allyson Sabal, Delton See, MD [facility]   In 1 month Laural Benes Binnie Rail, MD [facility]             montelukast (SINGULAIR) 10 MG tablet [Pharmacy Med Name: MONTELUKAST 10 MG TAB 10 Tablet] 90 tablet 0    Sig: TAKE 1 TABLET BY MOUTH EVERY EVENING *REFILL REQUEST*     Pulmonology:  Leukotriene Inhibitors Passed - 06/05/2023  8:01 AM      Passed - Valid encounter within last 12 months    Recent Outpatient Visits           2 months ago Type 2 diabetes mellitus with morbid obesity (HCC)   [place] [facility] Jon[facility] Blue B, MD   6 months ago Type 2 diabetes mellitus with morbid obesity [facility])   [place] [facility] Jonah Blue B, MD   11 months ago Type 2 diabetes mellitus with morbid  obesity [facility])   [place] [facility] Blue B, MD   1 year ago Type 2 diabetes mellitus with morbid obesity [facility])   [place] [facility] Marcine Matar, MD   1 year ago Mixed stress and urge urinary incontinence   [place] [facility] Marcine Matar, MD       Future Appointments             In 2 weeks Allyson Sabal, Delton See, MD [facility]   In 1 month Marcine Matar, MD [place] [facility]             gabapentin (NEURONTIN) 300 MG capsule [Pharmacy Med Name: GABAPENTIN 300 MG CAPS 300 Capsule] 90 capsule 0    Sig: TAKE 1 CAPSULE BY MOUTH EVERY EVENING WITH 600MG =900MG  *REFILL REQUEST*     Neurology: Anticonvulsants - gabapentin Passed - 06/05/2023  8:01 AM      Passed - Cr in normal range and within 360 days    Creat  Date Value Ref Range Status  09/22/2016 0.80 0.50 - 1.05 mg/dL Final    Comment:      For patients > or = 65 years of age: The upper reference limit for Creatinine is approximately 13% higher for people identified as African-American.      Creatinine, Ser  Date Value Ref Range Status  10/06/2022 0.96 0.57 - 1.00 mg/dL Final         Passed - Completed PHQ-2 or PHQ-9 in the last 360 days      Passed - Valid encounter within last 12 months    Recent Outpatient Visits           2 months ago Type 2 diabetes mellitus with morbid obesity [facility])   [place] [facility] Jonah Blue B, MD   6 months ago Type 2 diabetes mellitus with morbid obesity [facility])   [place] [facility] Jonah Blue B, MD   11 months ago Type 2 diabetes mellitus with morbid obesity [facility])    [facility] Jonah Blue B, MD   1 year ago Type 2 diabetes mellitus with  morbid obesity (HCC)   [place] [facility] Marcine Matar, MD   1 year ago Mixed stress and urge urinary incontinence   [place] [facility] Marcine Matar, MD       Future Appointments             In 2 weeks Allyson Sabal, Delton See, MD [facility]   In 1 month Laural Benes, Binnie Rail, MD [facility]             Blood Glucose Monitoring Suppl (ACCU-CHEK GUIDE) w/Device KIT [Pharmacy Med Name: ACCU-CHEK GUIDE METER KIT W/DEVICE Kit] 1 kit 0    Sig: USE TO TEST BLOOD SUGAR AS DIRECTED *REFILL REQUEST*     Endocrinology: Diabetes - Testing Supplies Passed - 06/05/2023  8:01 AM      Passed - Valid encounter within last 12 months    Recent Outpatient Visits           2 months ago Type 2 diabetes mellitus with morbid obesity (HCC)   [place] [facility] Jonah Blue B, MD   6 months ago Type 2 diabetes mellitus with morbid obesity [facility])   [place] [facility] Jon[facility] Blue B, MD   11 months ago Type 2 diabetes mellitus with morbid [facility])   [place] [facility] Jonah Blue B, MD   1 year ago Type 2 diabetes mellitus with morbid obesity [facility])   [place] [facility] Marcine Matar, MD   1 year ago Mixed stress and urge urinary incontinence   [place] [facility] Marcine Matar, MD       Future Appointments             In 2 weeks Allyson Sabal, Delton See, MD [facility]   In 1 month Marcine Matar, MD [place] [facility]             atorvastatin (LIPITOR) 40 MG tablet [Pharmacy Med Name: ATORVASTATIN 40MG  TABLET 40 Tablet] 90 tablet 0    Sig: TAKE 1 TABLET BY MOUTH EVERY EVENING *REFILL REQUEST*     Cardiovascular:  Antilipid - Statins Failed - 06/05/2023  8:01 AM      Failed - Lipid Panel in normal range within the last 12 months     Cholesterol, Total  Date Value Ref Range Status  10/06/2022 140 100 - 199 mg/dL Final   LDL Chol Calc (NIH)  Date Value Ref Range Status  10/06/2022 68 0 - 99 mg/dL Final   HDL  Date Value Ref Range Status  10/06/2022 58 >39 mg/dL Final   Triglycerides  Date Value Ref Range Status  10/06/2022 71 0 - 149 mg/dL Final         Passed - Patient is not pregnant      Passed - Valid encounter within last 12 months    Recent Outpatient Visits           2 months ago Type 2 diabetes mellitus with morbid obesity (HCC)   [place] [facility] Jonah Blue B, MD   6 months ago Type 2 diabetes mellitus with morbid obesity [facility])   [place] [facility] Jonah Blue B, MD   11 months ago Type 2 diabetes  mellitus with morbid obesity [facility])   [place] [facility] Jonah Blue B, MD   1 year ago Type 2 diabetes mellitus with morbid [facility])   [place] [facility] Marcine Matar, MD   1 year ago Mixed stress and urge urinary incontinence   [place] [facility] Marcine Matar, MD       Future Appointments             In 2 weeks Allyson Sabal, Delton See, MD [facility]   In 1 month Laural Benes, Binnie Rail, MD [facility]

## 2023-06-08 NOTE — Telephone Encounter (Signed)
Called and spoke with patient in regard to medication refill and which pharmacy she is using. Patient states that [facility]. [facility], patient is no longer using. [facility].

## 2023-06-09 ENCOUNTER — Other Ambulatory Visit: Payer: Self-pay | Admitting: Internal Medicine

## 2023-06-09 DIAGNOSIS — E1169 Type 2 diabetes mellitus with other specified complication: Secondary | ICD-10-CM

## 2023-06-09 NOTE — Telephone Encounter (Signed)
I spoke to the patient and she said she never heard from [employer] about switching out her wheelchair.  I told her that they may have tried to reach her but her voicemail has been full. I provided her with the phone number for [employer] : [phone] and encouraged her to call them to see if they would switch her wheelchairs.  She said she understood and would call them.

## 2023-06-10 ENCOUNTER — Encounter: Payer: Self-pay | Admitting: Bariatrics

## 2023-06-10 ENCOUNTER — Other Ambulatory Visit: Payer: Self-pay

## 2023-06-10 ENCOUNTER — Ambulatory Visit: Payer: MEDICAID | Admitting: Bariatrics

## 2023-06-10 VITALS — BP 127/77 | HR 57 | Temp 97.9°F | Ht 68.0 in | Wt 259.0 lb

## 2023-06-10 DIAGNOSIS — Z6839 Body mass index (BMI) 39.0-39.9, adult: Secondary | ICD-10-CM

## 2023-06-10 DIAGNOSIS — E1169 Type 2 diabetes mellitus with other specified complication: Secondary | ICD-10-CM | POA: Diagnosis not present

## 2023-06-10 DIAGNOSIS — Z7985 Long-term (current) use of injectable non-insulin antidiabetic drugs: Secondary | ICD-10-CM | POA: Diagnosis not present

## 2023-06-10 DIAGNOSIS — Z7984 Long term (current) use of oral hypoglycemic drugs: Secondary | ICD-10-CM

## 2023-06-10 DIAGNOSIS — I10 Essential (primary) hypertension: Secondary | ICD-10-CM

## 2023-06-10 MED ORDER — TRULICITY 4.5 MG/0.5ML ~~LOC~~ SOAJ: 4.5000 mg | SUBCUTANEOUS | 0 refills | Status: DC

## 2023-06-10 MED ORDER — FAMOTIDINE 20 MG PO TABS: 20.0000 mg | ORAL_TABLET | Freq: Two times a day (BID) | ORAL | 8 refills | Status: AC

## 2023-06-10 NOTE — Progress Notes (Signed)
WEIGHT SUMMARY AND BIOMETRICS  Weight Gained Since Last Visit: 1lb   Vitals Temp: 97.9 F (36.6 C) BP: 127/77 Pulse Rate: (!) 57 SpO2: 99 %   Anthropometric Measurements Height: 5\' 8"  (1.727 m) Weight: 259 lb (117.5 kg) BMI (Calculated): 39.39 Weight at Last Visit: 258lb Weight Gained Since Last Visit: 1lb Starting Weight: 310lb Total Weight Loss (lbs): 51 lb (23.1 kg)   Body Composition  Body Fat %: 51.1 % Fat Mass (lbs): 132.4 lbs Muscle Mass (lbs): 120.2 lbs Total Body Water (lbs): 88.4 lbs Visceral Fat Rating : 16   Other Clinical Data Fasting: no Labs: no Today's Visit #: 26 Starting Date: 06/03/21    OBESITY Jayln is here to discuss her progress with her obesity treatment plan along with follow-up of her obesity related diagnoses.     Nutrition Plan: the Category 3 plan - 40% adherence.  Current exercise:  Chair exercises.  Will resume exercises that she suspended during her vacation.   Interim History:  She is up 1 lb since her last visit. According to the bioimpedance scale her water is up and muscle is down slightly. She has been on vacation.  Not eating all of the food on the plan., Protein intake is as prescribed, Is skipping meals, and Water intake is inadequate.  Pharmacotherapy: Maetta is on Trulicity 4.5 mg SQ weekly Adverse side effects: None Hunger is moderately controlled.  Cravings are moderately controlled.  Assessment/Plan:   1. Type 2 diabetes mellitus with obesity (HCC) Type II Diabetes HgbA1c is at goal. Last A1c was 5.4 CBGs: FBS: 80's typically Episodes of hypoglycemia: no Medication(s): Trulicity 4.5 mg SQ weekly  Lab Results  Component Value Date   HGBA1C 5.4 03/15/2023   HGBA1C 5.3 10/06/2022   HGBA1C 6.3 07/10/2022   Lab Results  Component Value Date   LDLCALC 68 10/06/2022   CREATININE 0.96 10/06/2022   Lab Results  Component Value Date   GFR 87.84 03/08/2018    Plan: Continue and refill  Trulicity 4.5 mg SQ weekly Continue Metformin.  Discussed hypoglycemia.  Will keep all carbohydrates low both sweets and starches. Will have a protein if not eating.  Will decrease her snacking and will make sure that she eats all of her meals.  Eat more low glycemic index foods.      Morbid Obesity: Current BMI BMI (Calculated): 39.39   Pharmacotherapy Plan Continue and refill  Trulicity 4.5 mg SQ weekly  Angeleigh is currently in the action stage of change. As such, her goal is to continue with weight loss efforts.  She has agreed to the Category 3 plan. (Limiting carbohydrates).  Exercise goals: All adults should avoid inactivity. Some physical activity is better than none, and adults who participate in any amount of physical activity gain some health benefits.  Behavioral modification strategies: increasing lean protein intake, decreasing simple carbohydrates , no meal skipping, decrease eating out, meal planning , increase water intake, better snacking choices, planning for success, increasing fiber rich foods, decreasing sodium intake, and keep healthy foods in the home. She will continue her chair exercises.   Sararose has agreed to follow-up with our clinic in 4 weeks.    Objective:   VITALS: Per patient if applicable, see vitals. GENERAL: Alert and in no acute distress. CARDIOPULMONARY: No increased WOB. Speaking in clear sentences.  PSYCH: Pleasant and cooperative. Speech normal rate and rhythm. Affect is appropriate. Insight and judgement are appropriate. Attention is focused, linear, and appropriate.  NEURO: Oriented as  arrived to appointment on time with no prompting.   Attestation Statements:    This was prepared with the assistance of Engineer, civil (consulting).  Occasional wrong-word or sound-a-like substitutions may have occurred due to the inherent limitations of voice recognition software. Corinna Capra, DO

## 2023-06-14 ENCOUNTER — Ambulatory Visit (INDEPENDENT_AMBULATORY_CARE_PROVIDER_SITE_OTHER): Payer: MEDICAID | Admitting: Clinical

## 2023-06-14 DIAGNOSIS — F341 Dysthymic disorder: Secondary | ICD-10-CM

## 2023-06-14 NOTE — Progress Notes (Signed)
   THERAPIST PROGRESS NOTE  Session Time: 30 minutes  Participation Level: Active  Behavioral Response: CasualAlertDepressed  Type of Therapy: Individual Therapy  Treatment Goals addressed: Client will identify 3 cognitive patterns and believes that support depression  ProgressTowards Goals: Progressing  Interventions: CBT and Supportive  Summary:  Robin [name] is a 67 y.o. female who presents for the scheduled appointment oriented x 5, appropriately dressed, and friendly.  Client denied hallucinations and delusions. Client reported on today she has been doing about the same.  Client reported she went to the wedding for a family member on her husband side of the family.  Client reported she has some reservations about going but she has a good relationship with his extended family.  Client reported she is also happy that she went because her son went to the wedding that she was able to see him.  Client reported she is not able to see her son as much during the year and maintains contact through phone.  Client reported attending of lives with her husband is always interesting because he puts on a full side that their marriage is fine and nothing is wrong.  Client reported she was not able to talk to her husband's brother who she is able to confide in when they see each other about the nature of their marriage.  Client reported she continues to contemplate as to why her husband will not discussed been parting ways but instead trying to go along like everything is fine between them.  Client reported she is going to contemplate initiating a conversation with him about them separating.  Client reported she is going to wait until there is a good time and give herself time to think about how she is going to present the conversation. Evidence of progress towards goal: Client reported 1 positive with keeping herself grounded by speaking with her children weekly.  Suicidal/Homicidal: Nowithout  intent/plan  Therapist Response:  Therapist began the appointment asking client how she has been doing since last seen. Therapist used CBT to engage using active listening and positive emotional support. Therapist used CBT to give the client time to discuss her thoughts and feelings about events that transpired since her last therapy session. Therapist used CBT to normalize the clients emotional response within reason. Therapist used CBT to engage with the client about brainstorming how she can communicate with problems between the marriage. Therapist used CBT ask the client to identify her progress with frequency of use with coping skills with continued practice in her daily activity.    Therapist assigned the client homework to practice self-care.   Plan: Return again in 4 weeks.  Diagnosis: Persistent depressive disorder  Collaboration of Care: Patient refused AEB none requested by the client.  Patient/Guardian was advised Release of Information must be obtained prior to any record release in order to collaborate their care with an outside provider. Patient/Guardian was advised if they have not already done so to contact the registration department to sign all necessary forms in order for [name] to release information regarding their care.   Consent: Patient/Guardian gives verbal consent for treatment and assignment of benefits for services provided during this visit. Patient/Guardian expressed understanding and agreed to proceed.   Robin Rhymes Montserrat Shek, LCSW 06/14/2023

## 2023-06-22 ENCOUNTER — Ambulatory Visit: Payer: MEDICAID | Attending: Cardiovascular Disease | Admitting: Cardiovascular Disease

## 2023-06-22 ENCOUNTER — Encounter: Payer: Self-pay | Admitting: Cardiovascular Disease

## 2023-06-22 VITALS — BP 108/64 | HR 60 | Ht 68.0 in | Wt 260.0 lb

## 2023-06-22 DIAGNOSIS — I10 Essential (primary) hypertension: Secondary | ICD-10-CM | POA: Diagnosis not present

## 2023-06-22 DIAGNOSIS — G4733 Obstructive sleep apnea (adult) (pediatric): Secondary | ICD-10-CM

## 2023-06-22 DIAGNOSIS — E782 Mixed hyperlipidemia: Secondary | ICD-10-CM

## 2023-06-22 NOTE — Patient Instructions (Signed)
Medication Instructions:  Your physician recommends that you continue on your current medications as directed. Please refer to the Current Medication list given to you today.  *If you need a refill on your cardiac medications before your next appointment, please call your pharmacy*   Follow-Up: At [facility], you and your health needs are our priority.  As part of our continuing mission to provide you with exceptional heart care, we have created designated Provider Care Teams.  These Care Teams include your primary Cardiologist (physician) and Advanced Practice Providers (APPs -  Physician Assistants and Nurse Practitioners) who all work together to provide you with the care you need, when you need it.  We recommend signing up for the patient portal called "MyChart".  Sign up information is provided on this After Visit Summary.  MyChart is used to connect with patients for Virtual Visits (Telemedicine).  Patients are able to view lab/test results, encounter notes, upcoming appointments, etc.  Non-urgent messages can be sent to your provider as well.   To learn more about what you can do with MyChart, go to [email or web address].    Your next appointment:   We will see you on an as needed basis.  Provider:   Nanetta Batty, MD

## 2023-06-22 NOTE — Assessment & Plan Note (Signed)
History of essential hypertension with blood pressure measured today at 108/64.  She is on amlodipine, hydralazine, hydrochlorothiazide, losartan and metoprolol.

## 2023-06-22 NOTE — Progress Notes (Signed)
06/22/2023 Robin [name]   1956-06-10  [id number]  Primary Physician Marcine Matar, MD Primary Cardiologist: Runell Gess MD FACP, [place], [place], [place]  HPI:  Robin [name] is a 67 y.o.  morbidly overweight married African-American female mother of 4, grandmother of 7 grandchildren who I last saw in the office 03/10/2022.  She was referred by her PCP, Dr. Jonah Blue, for increasing dyspnea on exertion of unclear etiology. I performed cardiac catheterization on her 08/17/2002 for chest pain, shortness of breath and a false positive Cardiolite suggesting anterior ischemia versus breast attenuation.  Her coronary arteries were completely normal as was her LV function.  She is noticed increasing dyspnea on exertion over the last several months to years.  She has gained a significant amount of weight as well however.  She is being evaluated by Dr. Sherene Sires for pulmonary etiology.  She did have a coronary CTA performed 06/09/2018 that was entirely normal with a coronary calcium score of 0.  She has normal LV function by 2D echocardiography.   Since I saw her in the office 1 year ago she has remained stable.  She is wheelchair-bound because of orthopedic issues.  She denies chest pain or shortness of breath.   Current Meds  Medication Sig   Accu-Chek Softclix Lancets lancets Use to check sugar once daily. E11.69   albuterol (VENTOLIN HFA) 108 (90 Base) MCG/ACT inhaler INHALE 1-2 PUFFS BY MOUTH into THE lungs EVERY 6 HOURS AS NEEDED FOR WHEEZING AND/OR SHORTNESS OF BREATH   amLODipine (NORVASC) 10 MG tablet Take 1 tablet by mouth daily.   aspirin EC 81 MG tablet Take 81 mg by mouth at bedtime.   atorvastatin (LIPITOR) 40 MG tablet TAKE 1 TABLET BY MOUTH EVERY EVENING *REFILL REQUEST*   azelastine (ASTELIN) 0.1 % nasal spray 1-2 sprays per nostril 2 times daily as needed   Blood Glucose Monitoring Suppl (ACCU-CHEK GUIDE) w/Device KIT USE TO TEST BLOOD SUGAR AS DIRECTED   buPROPion (WELLBUTRIN  XL) 300 MG 24 hr tablet TAKE ONE TABLET BY MOUTH ONCE DAILY   CALCIUM CARBONATE ANTACID PO Take 2 tablets by mouth See admin instructions. Take 2 tablets by mouth every morning, may also take 2 tablets at night as needed for acid reflux   celecoxib (CELEBREX) 100 MG capsule TAKE ONE CAPSULE BY MOUTH TWICE DAILY   CINNAMON PO Take 1 capsule by mouth daily.   Dulaglutide (TRULICITY) 4.5 MG/0.5ML SOPN Inject 4.5 mg as directed once a week.   esomeprazole (NEXIUM) 40 MG capsule Take 1 capsule (40 mg total) by mouth 2 (two) times daily.   famotidine (PEPCID) 20 MG tablet Take 1 tablet (20 mg total) by mouth 2 (two) times daily.   fexofenadine (ALLEGRA) 180 MG tablet Take 1 tablet (180 mg total) by mouth daily.   fluticasone (FLONASE) 50 MCG/ACT nasal spray Place 1 spray into both nostrils daily.   gabapentin (NEURONTIN) 300 MG capsule TAKE 1 CAPSULE BY MOUTH EVERY EVENING WITH 600MG =900MG  *REFILL REQUEST*   gabapentin (NEURONTIN) 600 MG tablet TAKE ONE TABLET BY MOUTH THREE TIMES DAILY   glucose blood (ACCU-CHEK GUIDE) test strip Use to check sugar once daily. E11.69   hydrALAZINE (APRESOLINE) 50 MG tablet TAKE ONE TABLET BY MOUTH EVERY MORNING and TAKE ONE TABLET BY MOUTH EVERY EVENING   hydrochlorothiazide (HYDRODIURIL) 25 MG tablet TAKE ONE TABLET BY MOUTH EVERY MORNING   levothyroxine (SYNTHROID) 88 MCG tablet Take 1 tablet by mouth daily.   LINZESS 290  MCG CAPS capsule TAKE 1 CAPSULE BY MOUTH BEFORE BREAKFAST *REFILL REQUEST*   losartan (COZAAR) 25 MG tablet Take 1 tablet (25 mg total) by mouth every evening.   meloxicam (MOBIC) 15 MG tablet    metFORMIN (GLUCOPHAGE) 500 MG tablet TAKE TWO TABLETS BY MOUTH EVERY MORNING and TAKE ONE TABLET BY MOUTH EVERY EVENING   metoprolol tartrate (LOPRESSOR) 25 MG tablet Take 1 tablet (25 mg total) by mouth 2 (two) times daily.   montelukast (SINGULAIR) 10 MG tablet TAKE 1 TABLET BY MOUTH EVERY EVENING *REFILL REQUEST*   Multiple Vitamin (MULTIVITAMIN WITH  MINERALS) TABS tablet Take 1 tablet by mouth daily.   NARCAN 4 MG/0.1ML LIQD nasal spray kit    NEOMYCIN-POLYMYXIN-HYDROCORTISONE (CORTISPORIN) 1 % SOLN OTIC solution Place 3 drops into the left ear 4 (four) times daily.   Omega-3 Fatty Acids (FISH OIL) 1000 MG CAPS Take 1,000 mg by mouth at bedtime.   ondansetron (ZOFRAN-ODT) 8 MG disintegrating tablet Take 1 tablet (8 mg total) by mouth every 8 (eight) hours as needed for nausea or vomiting.   Sodium Chloride-Sodium Bicarb (NETI POT SINUS WASH) 2300-700 MG KIT DO nasal rinse twice a week as needed   spironolactone (ALDACTONE) 25 MG tablet TAKE 1 TABLET BY MOUTH EVERY MORNING *REFILL REQUEST*   tiZANidine (ZANAFLEX) 2 MG tablet Take by mouth.   topiramate (TOPAMAX) 50 MG tablet TAKE ONE TABLET BY MOUTH EVERY MORNING and TAKE TWO TABLETS EVERY EVENING   vitamin B-12 (CYANOCOBALAMIN) 1000 MCG tablet Take 1,000 mcg by mouth daily.     Allergies  Allergen Reactions   Norvasc [Amlodipine Besylate] Other (See Comments)    Edema in lower extremities Other reaction(s): Other Edema in lower extremities   Celexa [Citalopram Hydrobromide] Other (See Comments)    "Made me feel out of it"   Diflucan [Fluconazole] Rash    Social History   Socioeconomic History   Marital status: Married    Spouse name: Not on file   Number of children: 4   Years of education: 10   Highest education level: Not on file  Occupational History   Occupation: Disabled  Tobacco Use   Smoking status: Former    Current packs/day: 0.00    Average packs/day: 0.5 packs/day for 17.7 years (8.9 ttl pk-yrs)    Types: Cigarettes    Start date: 07/14/1975    Quit date: 04/04/1993    Years since quitting: 30.2   Smokeless tobacco: Never   Tobacco comments:    quit 20 yrs ago  Vaping Use   Vaping status: Never Used  Substance and Sexual Activity   Alcohol use: No    Alcohol/week: 0.0 standard drinks of alcohol   Drug use: No   Sexual activity: Yes  Other Topics Concern    Not on file  Social History Narrative   Lives with husband in an apartment on the first floor.  Has 4 children.     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  Trying to get disability.  Formerly worked as a Surveyor, mining.  Education: 11th grade.      [facility] (12/23/16):   Originally from [facility]. She was raised in [place]. 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 No pets currently. No bird exposure. She does have mold in her current home in  the bathroom, laundry room, & master bedroom.    Social Determinants of Health   Financial Resource Strain: Low Risk  (01/22/2022)   Received from [facility], [facility]   Overall Financial Resource Strain (CARDIA)    Difficulty of Paying Living Expenses: Not hard at all  Food Insecurity: No Food Insecurity (01/22/2022)   [facility] from [facility], [facility]   Hunger Vital Sign    Worried About Running Out of Food in the Last Year: Never true    Ran Out of Food in the Last Year: Never true  Transportation Needs: Unmet Transportation Needs (10/15/2020)   PRAPARE - Administrator, Civil Service (Medical): Yes    Lack of Transportation (Non-Medical): No  Physical Activity: Unknown (01/22/2022)   Received from [facility], [facility]   Exercise Vital Sign    Days of Exercise per Week: 3 days    Minutes of Exercise per Session: Not on file  Stress: Stress Concern Present (01/22/2022)   Received from [facility], [facility] - Occupational Stress Questionnaire    Feeling of Stress : To some extent  Social Connections: Unknown (01/28/2023)   Received from [facility], [facility]   Social Network    Social Network: Not on file  Intimate Partner Violence: Unknown (01/28/2023)   [facility] from [facility], [facility]   HITS    Physically Hurt: Not on file    Insult or Talk Down To: Not on file    Threaten Physical Harm: Not on file    Scream or Curse: Not on file     Review of Systems: General: negative for chills, fever, night sweats or weight changes.  Cardiovascular: negative for chest pain, dyspnea on exertion, edema, orthopnea, palpitations, paroxysmal nocturnal dyspnea or shortness of breath Dermatological: negative for rash Respiratory: negative for cough or wheezing Urologic: negative for hematuria Abdominal: negative for nausea, vomiting, diarrhea, bright red blood per rectum, melena, or hematemesis Neurologic: negative for visual changes, syncope, or dizziness All other systems reviewed and are otherwise negative except as noted above.    Blood pressure 108/64, pulse 60, height 5\' 8"  (1.727 m), weight 260 lb (117.9 kg).  General appearance: alert and no distress Neck: no adenopathy, no carotid bruit, no JVD, supple, symmetrical, trachea midline, and thyroid not enlarged, symmetric, no tenderness/mass/nodules Lungs: clear to auscultation bilaterally Heart: regular rate and rhythm, S1, S2 normal, no murmur, click, rub or gallop Extremities: extremities normal, atraumatic, no cyanosis or edema Pulses: 2+ and symmetric Skin: Skin color, texture, turgor normal. No rashes or lesions Neurologic: Grossly normal  EKG EKG Interpretation Date/Time:  Tuesday June 22 2023 14:34:30 EDT Ventricular Rate:  60 PR Interval:  192 QRS Duration:  90 QT Interval:  416 QTC Calculation: 416 R Axis:   29  Text Interpretation: Normal sinus rhythm with sinus arrhythmia Normal ECG When compared with ECG of 04-Jan-2021 15:10, No significant change was found Confirmed by Nanetta Batty [phone]) on 06/22/2023 3:08:24 PM    ASSESSMENT AND PLAN:   Essential hypertension History of essential hypertension with blood pressure measured today at 108/64.  She is on amlodipine, hydralazine,  hydrochlorothiazide, losartan and metoprolol.  Hyperlipidemia History of hyperlipidemia on statin therapy with lipid profile performed 10/06/2022 revealing a total cholesterol of 140, LDL 68 and HDL of 58.     Runell Gess MD FACP,FACC,FAHA, [facility] 06/22/2023 3:14 PM

## 2023-06-22 NOTE — Assessment & Plan Note (Signed)
History of hyperlipidemia on statin therapy with lipid profile performed 10/06/2022 revealing a total cholesterol of 140, LDL 68 and HDL of 58.

## 2023-06-28 ENCOUNTER — Other Ambulatory Visit: Payer: Self-pay | Admitting: Bariatrics

## 2023-06-28 ENCOUNTER — Ambulatory Visit (INDEPENDENT_AMBULATORY_CARE_PROVIDER_SITE_OTHER): Payer: MEDICAID | Admitting: Clinical

## 2023-06-28 DIAGNOSIS — E1169 Type 2 diabetes mellitus with other specified complication: Secondary | ICD-10-CM

## 2023-06-28 DIAGNOSIS — F341 Dysthymic disorder: Secondary | ICD-10-CM

## 2023-06-28 NOTE — Progress Notes (Signed)
   THERAPIST PROGRESS NOTE  Session Time: 45 minutes  Participation Level: Active  Behavioral Response: CasualAlertDepressed  Type of Therapy: Individual Therapy  Treatment Goals addressed: Client will participate in at least 80% of scheduled individual psychotherapy sessions  ProgressTowards Goals: Progressing  Interventions: CBT and Supportive  Summary:  Robin [name] is a 67 y.o. female who presents for scheduled appointment oriented x 5, appropriately dressed, and friendly.  Client denied hallucinations and delusions. Client reported on today she continues to feel depressed.  Client reported her husband has been telling everyone that they will be moving to [place].  Client reported that she does not like that her husband is making decisions without discussing it with her.  Client reported he states that he wants them to move to [place] by the beginning of next year.  Client reported she does not know what she wants to do.  Client reported from the way that he has treated her over the past couple years she is not think that it is something she will be able to get past with him to make better.  Client reported he betrays that their marriage is perfect for everyone and that is not the case.  Client reported that her son would not acknowledge and has gotten mad at her in the past for implying that his father has not been faithful to her.  Client reported she does have a daughter that lives in [place] and staying with the daughter could be an option.  Client reported she does not talk to her children when her husband is around and wait until he leaves. Evidence of progress towards goal: Client reported 1 positive of having support from her daughter to help her think through some stressors that she is experiencing.  Suicidal/Homicidal: Nowithout intent/plan  Therapist Response:  Therapist began the appointment asking the client how she has been doing since last seen. Therapist used CBT to  engage using active listening and positive emotional support. Therapist used CBT to engage and give the client time to express her thoughts and feelings related to stress in her life. Therapist used CBT to normalize the clients emotional response within reason. Therapist used CBT to engage the client to help identify some positives and brainstorm some ideas that could help with her stressors. Therapist used CBT ask the client to identify her progress with frequency of use with coping skills with continued practice in her daily activity.    Therapist assigned client homework to practice self-care to the best of her ability.   Plan: Return again in 4 weeks.  Diagnosis: Persistent depressive disorder  Collaboration of Care: Patient refused AEB none requested by the client.  Patient/Guardian was advised Release of Information must be obtained prior to any record release in order to collaborate their care with an outside provider. Patient/Guardian was advised if they have not already done so to contact the registration department to sign all necessary forms in order for [name] to release information regarding their care.   Consent: Patient/Guardian gives verbal consent for treatment and assignment of benefits for services provided during this visit. Patient/Guardian expressed understanding and agreed to proceed.   Neena Rhymes Dayanna Pryce, LCSW 06/28/2023

## 2023-07-08 ENCOUNTER — Telehealth (HOSPITAL_COMMUNITY): Payer: Self-pay | Admitting: Psychiatry

## 2023-07-08 ENCOUNTER — Ambulatory Visit: Payer: MEDICAID | Admitting: Bariatrics

## 2023-07-08 DIAGNOSIS — F411 Generalized anxiety disorder: Secondary | ICD-10-CM

## 2023-07-08 DIAGNOSIS — F341 Dysthymic disorder: Secondary | ICD-10-CM

## 2023-07-08 MED ORDER — DULOXETINE HCL 60 MG PO CPEP: 60.0000 mg | ORAL_CAPSULE | Freq: Two times a day (BID) | ORAL | 1 refills | Status: DC

## 2023-07-08 MED ORDER — BUPROPION HCL ER (XL) 300 MG PO TB24
300.0000 mg | ORAL_TABLET | Freq: Every day | ORAL | 1 refills | Status: DC
Start: 2023-07-08 — End: 2023-08-17
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
## 2023-07-08 NOTE — Telephone Encounter (Signed)
Received refill request for Cymbalta and Wellbutrin to bridge until next appointment. Refill send to preferred pharmacy.  Daine Gip, MD 07/08/23

## 2023-07-09 ENCOUNTER — Encounter (HOSPITAL_COMMUNITY): Payer: MEDICAID | Admitting: Psychiatry

## 2023-07-14 ENCOUNTER — Ambulatory Visit: Payer: MEDICAID | Admitting: Bariatrics

## 2023-07-15 ENCOUNTER — Encounter: Payer: Self-pay | Admitting: Internal Medicine

## 2023-07-15 ENCOUNTER — Ambulatory Visit: Payer: Medicaid Other | Admitting: Internal Medicine

## 2023-07-15 ENCOUNTER — Ambulatory Visit: Payer: Medicare Other | Attending: Internal Medicine | Admitting: Internal Medicine

## 2023-07-15 DIAGNOSIS — Z780 Asymptomatic menopausal state: Secondary | ICD-10-CM

## 2023-07-15 DIAGNOSIS — M158 Other polyosteoarthritis: Secondary | ICD-10-CM

## 2023-07-15 DIAGNOSIS — N816 Rectocele: Secondary | ICD-10-CM

## 2023-07-15 DIAGNOSIS — E1159 Type 2 diabetes mellitus with other circulatory complications: Secondary | ICD-10-CM

## 2023-07-15 DIAGNOSIS — L309 Dermatitis, unspecified: Secondary | ICD-10-CM | POA: Diagnosis not present

## 2023-07-15 DIAGNOSIS — I152 Hypertension secondary to endocrine disorders: Secondary | ICD-10-CM | POA: Diagnosis not present

## 2023-07-15 DIAGNOSIS — E1169 Type 2 diabetes mellitus with other specified complication: Secondary | ICD-10-CM | POA: Diagnosis not present

## 2023-07-15 DIAGNOSIS — R2689 Other abnormalities of gait and mobility: Secondary | ICD-10-CM

## 2023-07-15 DIAGNOSIS — Z7985 Long-term (current) use of injectable non-insulin antidiabetic drugs: Secondary | ICD-10-CM | POA: Diagnosis not present

## 2023-07-15 DIAGNOSIS — Z23 Encounter for immunization: Secondary | ICD-10-CM | POA: Diagnosis not present

## 2023-07-15 DIAGNOSIS — E785 Hyperlipidemia, unspecified: Secondary | ICD-10-CM | POA: Diagnosis not present

## 2023-07-15 DIAGNOSIS — E119 Type 2 diabetes mellitus without complications: Secondary | ICD-10-CM

## 2023-07-15 DIAGNOSIS — J309 Allergic rhinitis, unspecified: Secondary | ICD-10-CM

## 2023-07-15 DIAGNOSIS — Z7984 Long term (current) use of oral hypoglycemic drugs: Secondary | ICD-10-CM

## 2023-07-15 LAB — POCT GLYCOSYLATED HEMOGLOBIN (HGB A1C): HbA1c, POC (controlled diabetic range): 5 % (ref 0.0–7.0)

## 2023-07-15 LAB — GLUCOSE, POCT (MANUAL RESULT ENTRY): POC Glucose: 102 mg/dL — AB (ref 70–99)

## 2023-07-15 MED ORDER — NYSTATIN 100000 UNIT/GM EX POWD
1.0000 | Freq: Two times a day (BID) | CUTANEOUS | 3 refills | Status: AC
Start: 2023-07-15 — End: ?

## 2023-07-15 MED ORDER — MONTELUKAST SODIUM 10 MG PO TABS
10.0000 mg | ORAL_TABLET | Freq: Every day | ORAL | 1 refills | Status: DC
Start: 2023-07-15 — End: 2024-01-20

## 2023-07-15 MED ORDER — ATORVASTATIN CALCIUM 40 MG PO TABS
40.0000 mg | ORAL_TABLET | Freq: Every day | ORAL | 2 refills | Status: DC
Start: 2023-07-15 — End: 2024-01-20

## 2023-07-15 MED ORDER — SPIRONOLACTONE 25 MG PO TABS
25.0000 mg | ORAL_TABLET | Freq: Every day | ORAL | 1 refills | Status: DC
Start: 2023-07-15 — End: 2024-01-20

## 2023-07-15 NOTE — Progress Notes (Signed)
Patient ID: Robin [name], female    DOB: November 26, 1955  MRN: [id number]  CC: Diabetes (DM & HTN f/u. Med refills. /Red rash under R breast and R side of abdomen X5days, spreading, itchy, sensitive, odor/Yes to flu vax. Yes to pneumonia vax)   Subjective: Robin [name] is a 67 y.o. female who presents for chronic ds management. Her concerns today include:  Patient with history of HTN, DM, OSA, fibromyalgia, depression, obesity,  GERD, hypothyroid, OA of multiple js (hands, knees, shoulders, hips), pituitary adenoma status post resection   HM: Yes to flu vaccine and PCV 20.  Also due for bone density study.  C/O Red rash under R breast and R side of abdomen X5days, spreading, itchy, sensitive touch and has an odor -no known initiating factors; spouse does not have same  DM:  Results for orders placed or performed in visit on 07/15/23  POCT glycosylated hemoglobin (Hb A1C)  Result Value Ref Range   Hemoglobin A1C     HbA1c POC (<> result, manual entry)     HbA1c, POC (prediabetic range)     HbA1c, POC (controlled diabetic range) 5.0 0.0 - 7.0 %  POCT glucose (manual entry)  Result Value Ref Range   POC Glucose 102 (A) 70 - 99 mg/dl   *Note: Due to a large number of results and/or encounters for the requested time period, some results have not been displayed. A complete set of results can be found in Results Review.  On Trulicity 4.5 mg once a week, metformin 1 g in the morning and 500 mg in the evening.  Still on Topamax to help with weight loss.  Still followed by medical weight management.  Last seen 1 mth ago and was down 51 lbs since starting the program Does okay with eating habits. Eating less Referred for physical therapy on last visit to help increased mobility and get her out of mobility chair.  She was going 2x/wk for several wks.  She was disgh because she was assessed to have severe BL hip and knee flexion contractures which have not significantly changed with PT  interventions, causing her to walk with continued severely crouched posture.  However since physical therapy, she has been trying to get up and walk around more in her house throughout the day for about 15 minutes at a time.  She has osteoarthritis of the hips and knees.  She was seeing Dr. Roda Shutters.  Needs joint replacement but patient states she was told that she has to lose weight and has to move more.  She is not sure how much weight she was supposed to lose but she is down 51 pounds since being with medical weight management.  HTN:   Current medications are metoprolol 50 mg twice a day, spironolactone 25 mg daily, Norvasc 10 mg daily, Cozaar 25 mg daily, hydrochlorothiazide 25 mg daily, hydralazine 50 mg twice a day.  Consistent with taking.  Her home device no longer works.  Limits salt in foods   HL: Compliant with atorvastatin 40 mg daily.  Last LDL in January of this year was 84.  Had repeat colonoscopy in August.  Several polyps removed which were benign.  Will be due again in 3 years.  Referred to urogynecology on last visit for rectocele.  Apparently the referral was sent to urology and they declined the referral stating that she needs to see a urogynecologist. Patient Active Problem List   Diagnosis Date Noted   Type 2 diabetes mellitus  with obesity (HCC) 02/04/2023   Moderate major depression (HCC) 11/12/2022   BMI 40.0-44.9, adult (HCC) 11/03/2022   B12 deficiency 10/06/2022   Depression 07/09/2022   Class 3 severe obesity with serious comorbidity and body mass index (BMI) of 45.0 to 49.9 in adult (HCC) 07/09/2022   Major depressive disorder, recurrent episode (HCC) 07/02/2022   Generalized anxiety disorder 07/02/2022   Body mass index 40.0-44.9, adult (HCC) 02/25/2022   Morbid obesity (HCC) 02/25/2022   Right knee pain 02/18/2022   Type 2 diabetes mellitus with obesity (HCC) 02/18/2022   Lumbar foraminal stenosis 01/23/2022   Lumbar radiculopathy 01/23/2022   Pain in both knees  01/23/2022   Vitamin D deficiency 11/18/2021   Polyp of descending colon 02/15/2020   Pituitary tumor 08/21/2019   Primary osteoarthritis of both hips 08/21/2019   Primary osteoarthritis of both knees 08/21/2019   Pain in left shoulder 08/21/2019   Morbid obesity with BMI of 60.0-69.9, adult (HCC) 07/17/2019   Pedal edema 07/17/2019   Multilevel degenerative disc disease 10/31/2018   Change in bowel function 10/28/2018   Gastritis and gastroduodenitis 04/26/2018   Atrophic vaginitis 04/01/2018   Cough, persistent 02/08/2018   Dysphagia 12/14/2017   Generalized OA 07/30/2017   Urinary incontinence 07/23/2017   Early satiety 06/16/2017   Hx of iron deficiency anemia 05/28/2017   Throat pain in adult 04/05/2017   Prediabetes 02/04/2017   Dyspnea on exertion 12/23/2016   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 12/23/2016   Hemoptysis 12/23/2016   Fibromyalgia 08/18/2016   Chronic lymphocytic thyroiditis 08/18/2016   Persistent depressive disorder 04/01/2016   OSA (obstructive sleep apnea) 04/01/2016   Essential hypertension 12/09/2015   Hypothyroidism 12/09/2015   Morbid obesity due to excess calories (HCC) 12/09/2015   Constipation 05/27/2015   Fatty liver 05/27/2015   Abdominal pain, chronic, epigastric 07/04/2012   History of gastroesophageal reflux (GERD) 06/09/2012   Hyperlipidemia 06/09/2012   Refusal of blood transfusions as patient is Jehovah's Witness 06/09/2012   Anxiety 06/09/2012   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 E11.69 100 each 2   albuterol (VENTOLIN HFA) 108 (90 Base) MCG/ACT inhaler INHALE 1-2 PUFFS BY MOUTH into THE lungs EVERY 6 HOURS AS NEEDED FOR WHEEZING  AND/OR SHORTNESS OF BREATH 18 g 6   amLODipine (NORVASC) 10 MG tablet Take 1 tablet by mouth daily.     aspirin EC 81 MG tablet Take 81 mg by mouth at bedtime.     atorvastatin (LIPITOR) 40 MG tablet TAKE 1 TABLET BY MOUTH EVERY EVENING *REFILL REQUEST* 90 tablet 0   azelastine (ASTELIN) 0.1 % nasal spray 1-2 sprays per nostril 2 times daily as needed 30 mL 12   Blood Glucose Monitoring Suppl (ACCU-CHEK GUIDE) w/Device KIT USE TO TEST BLOOD SUGAR AS DIRECTED 1 kit 0   buPROPion (WELLBUTRIN XL) 300 MG 24 hr tablet Take 1 tablet (300 mg total) by mouth daily. 30 tablet 1   CALCIUM CARBONATE ANTACID PO Take 2 tablets by mouth See admin instructions. Take 2 tablets by mouth every morning, may also take 2 tablets at night as needed for acid reflux     celecoxib (CELEBREX) 100 MG capsule TAKE ONE CAPSULE BY MOUTH TWICE DAILY 180 capsule 3  CINNAMON PO Take 1 capsule by mouth daily.     Dulaglutide (TRULICITY) 4.5 MG/0.5ML SOPN Inject 4.5 mg as directed once a week. 2 mL 0   DULoxetine (CYMBALTA) 60 MG capsule Take 1 capsule (60 mg total) by mouth 2 (two) times daily. 60 capsule 1   esomeprazole (NEXIUM) 40 MG capsule Take 1 capsule (40 mg total) by mouth 2 (two) times daily. 60 capsule 3   famotidine (PEPCID) 20 MG tablet Take 1 tablet (20 mg total) by mouth 2 (two) times daily. 60 tablet 8   fluticasone (FLONASE) 50 MCG/ACT nasal spray Place 1 spray into both nostrils daily. 16 g 1   gabapentin (NEURONTIN) 600 MG tablet TAKE ONE TABLET BY MOUTH THREE TIMES DAILY 270 tablet 1   glucose blood (ACCU-CHEK GUIDE) test strip Use to check sugar once daily. E11.69 100 each 2   hydrALAZINE (APRESOLINE) 50 MG tablet TAKE ONE TABLET BY MOUTH EVERY MORNING and TAKE ONE TABLET BY MOUTH EVERY EVENING 180 tablet 3   hydrochlorothiazide (HYDRODIURIL) 25 MG tablet TAKE ONE TABLET BY MOUTH EVERY MORNING 90 tablet 3   levothyroxine (SYNTHROID) 88 MCG tablet Take 1 tablet by mouth daily.     LINZESS 290 MCG CAPS capsule  TAKE 1 CAPSULE BY MOUTH BEFORE BREAKFAST *REFILL REQUEST* 12 capsule 10   losartan (COZAAR) 25 MG tablet Take 1 tablet (25 mg total) by mouth every evening. 90 tablet 2   meloxicam (MOBIC) 15 MG tablet      metFORMIN (GLUCOPHAGE) 500 MG tablet TAKE TWO TABLETS BY MOUTH EVERY MORNING and TAKE ONE TABLET BY MOUTH EVERY EVENING 270 tablet 3   metoprolol tartrate (LOPRESSOR) 25 MG tablet Take 1 tablet (25 mg total) by mouth 2 (two) times daily. 180 tablet 1   montelukast (SINGULAIR) 10 MG tablet TAKE 1 TABLET BY MOUTH EVERY EVENING *REFILL REQUEST* 90 tablet 0   Multiple Vitamin (MULTIVITAMIN WITH MINERALS) TABS tablet Take 1 tablet by mouth daily.     NARCAN 4 MG/0.1ML LIQD nasal spray kit      NEOMYCIN-POLYMYXIN-HYDROCORTISONE (CORTISPORIN) 1 % SOLN OTIC solution Place 3 drops into the left ear 4 (four) times daily. 10 mL 0   Omega-3 Fatty Acids (FISH OIL) 1000 MG CAPS Take 1,000 mg by mouth at bedtime.     ondansetron (ZOFRAN-ODT) 8 MG disintegrating tablet Take 1 tablet (8 mg total) by mouth every 8 (eight) hours as needed for nausea or vomiting. 20 tablet 0   Sodium Chloride-Sodium Bicarb (NETI POT SINUS WASH) 2300-700 MG KIT DO nasal rinse twice a week as needed 1 kit 0   spironolactone (ALDACTONE) 25 MG tablet TAKE 1 TABLET BY MOUTH EVERY MORNING *REFILL REQUEST* 90 tablet 0   tiZANidine (ZANAFLEX) 2 MG tablet Take by mouth.     topiramate (TOPAMAX) 50 MG tablet TAKE ONE TABLET BY MOUTH EVERY MORNING and TAKE TWO TABLETS EVERY EVENING 90 tablet 0   vitamin B-12 (CYANOCOBALAMIN) 1000 MCG tablet Take 1,000 mcg by mouth daily.     fexofenadine (ALLEGRA) 180 MG tablet Take 1 tablet (180 mg total) by mouth daily. (Patient not taking: Reported on 07/15/2023) 30 tablet 0   [DISCONTINUED] amitriptyline (ELAVIL) 50 MG tablet TAKE 1 TABLET BY MOUTH AT BEDTIME. 90 tablet 0   No current facility-administered medications on file prior to visit.    Allergies  Allergen Reactions   Norvasc [Amlodipine  Besylate] Other (See Comments)    Edema in lower extremities Other reaction(s): Other Edema in lower extremities  Celexa [Citalopram Hydrobromide] Other (See Comments)    "Made me feel out of it"   Diflucan [Fluconazole] Rash    Social History   Socioeconomic History   Marital status: Married    Spouse name: Not on file   Number of children: 4   Years of education: 10   Highest education level: Not on file  Occupational History   Occupation: Disabled  Tobacco Use   Smoking status: Former    Current packs/day: 0.00    Average packs/day: 0.5 packs/day for 17.7 years (8.9 ttl pk-yrs)    Types: Cigarettes    Start date: 07/14/1975    Quit date: 04/04/1993    Years since quitting: 30.2   Smokeless tobacco: Never   Tobacco comments:    quit 20 yrs ago  Vaping Use   Vaping status: Never Used  Substance and Sexual Activity   Alcohol use: No    Alcohol/week: 0.0 standard drinks of alcohol   Drug use: No   Sexual activity: Yes  Other Topics Concern   Not on file  Social History Narrative   Lives with husband in an apartment on the first floor.  Has 4 children.     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  Trying to get disability.  Formerly worked as a Surveyor, mining.  Education: 11th grade.      [facility] (12/23/16):   Originally from [facility] [place] [facility]. She was raised in [place]. 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 No pets currently. No bird exposure. She does have mold in her current home in the bathroom, laundry room, & master bedroom.    Social Determinants of Health   Financial Resource Strain: Low Risk  (01/22/2022)   Received from [facility], [facility]   Overall Financial Resource Strain (CARDIA)    Difficulty of Paying Living Expenses: Not hard at all  Food Insecurity: No Food Insecurity (01/22/2022)   Received  [facility], [facility]   Hunger Vital Sign    Worried About Running Out of Food in the Last Year: Never true    Ran Out of Food in the Last Year: Never true  Transportation Needs: Unmet Transportation Needs (10/15/2020)   PRAPARE - Administrator, Civil Service (Medical): Yes    Lack of Transportation (Non-Medical): No  Physical Activity: Unknown (01/22/2022)   Received from [facility], [facility]   Exercise Vital Sign    Days of Exercise per Week: 3 days    Minutes of Exercise per Session: Not on file  Stress: Stress Concern Present (01/22/2022)   Received from [facility], [facility] - Occupational Stress Questionnaire    Feeling of Stress : To some extent  Social Connections: Unknown (01/28/2023)   Received from [facility], [facility]   Social Network    Social Network: Not on file  Intimate Partner Violence: Unknown (01/28/2023)   Received from [facility], [facility]   HITS    Physically Hurt: Not on file    Insult or Talk Down To: Not on file    Threaten Physical Harm: Not on file    Scream or Curse: Not on file    Family History  Problem Relation Age of Onset   Diabetes Mother  Hypertension Mother    Heart Problems Mother    Hyperlipidemia Mother    Asthma Mother    Sleep apnea Mother    Obesity Mother    Colon polyps Mother    Diabetes Father    Kidney cancer Father    Hypertension Father    Hyperlipidemia Father    Sleep apnea Father    Hypertension Sister    Allergic rhinitis Sister    Hyperlipidemia Sister    Liver disease Brother    Arthritis Brother    Hypertension Brother    Hyperlipidemia Brother    Hypertension Brother    Hyperlipidemia Brother    Lupus Maternal Aunt    Breast cancer Maternal Grandmother    Kidney disease Maternal Grandfather    Alcoholism Maternal Grandfather    Breast cancer Paternal Grandmother    Hypertension Daughter    Hyperlipidemia  Daughter    Eczema Daughter    Hypertension Son    Stomach cancer Other        aunt   Colon cancer Neg Hx    Immunodeficiency Neg Hx    Urticaria Neg Hx    Prostate cancer Neg Hx    Rectal cancer Neg Hx    Esophageal cancer Neg Hx     Past Surgical History:  Procedure Laterality Date   ABDOMINAL HYSTERECTOMY  02/18/2000   CARDIAC CATHETERIZATION  08/18/2003   normal L main/LAD/L Cfx/RCA (Dr. Erlene Quan)   COLONOSCOPY  2006   Dr. Franky Macho, hyperplastic polyps   COLONOSCOPY  08/06/2011   abnormal terminal ileum for 10cm, erosions, geographical ulceration. Bx small bowel mucosa with prominent intramucosal lymphoid aggregates, slightly inflammed   COLONOSCOPY WITH PROPOFOL N/A 12/28/2019   Rourk: 4 sessile serrated adenomas removed on the colon.  Next colonoscopy in 3 years.   DIRECT LARYNGOSCOPY  03/15/2012   Procedure: DIRECT LARYNGOSCOPY;  Surgeon: Flo Shanks, MD;  Location: [facility] OR;  Service: ENT;  Laterality: N/A; Dr. Wolicki--:>no foreign body seen. normal esophagus to 40cm   ESOPHAGEAL DILATION  04/17/2015   Procedure: ESOPHAGEAL DILATION;  Surgeon: Corbin Ade, MD;  Location: [facility];  Service: Endoscopy;;   ESOPHAGOGASTRODUODENOSCOPY  08/23/2008   ZHY:QMVH distal esophageal erosions consistent with mild erosive reflux esophagitis, otherwise unremarkable esophagus/ Tiny antral erosions of doubtful clinical significance, otherwise normal stomach, patent pylorus, normal D1 and D2   ESOPHAGOGASTRODUODENOSCOPY  08/06/2011   small hh, noncritical Schatzki's ring s/p 56 F   ESOPHAGOGASTRODUODENOSCOPY N/A 04/17/2015   QIO:NGEXBM s/p dilation   ESOPHAGOGASTRODUODENOSCOPY (EGD) WITH PROPOFOL N/A 01/20/2018   Dr. Jena Gauss: Erosive gastropathy, normal-appearing esophagus status post empiric dilation.  Chronic gastritis, no H. pylori.   ESOPHAGOSCOPY  03/15/2012   Procedure: ESOPHAGOSCOPY;  Surgeon: Flo Shanks, MD;  Location: [facility] OR;  Service: ENT;  Laterality: N/A;   KNEE  ARTHROSCOPY Left 04/27/2001   MALONEY DILATION N/A 01/20/2018   Procedure: Elease Hashimoto DILATION;  Surgeon: Corbin Ade, MD;  Location: [facility];  Service: Endoscopy;  Laterality: N/A;   NM MYOCAR PERF WALL MOTION  2003   negative bruce protocol exercise tress test; EF 68%; intermediate risk study due to evidence of anterior wall ischemia extending from mid-ventricle to apex   PITUITARY SURGERY  06/2012   benign tumor, surgeon at [facility]   POLYPECTOMY  12/28/2019   Procedure: POLYPECTOMY;  Surgeon: Corbin Ade, MD;  Location: [facility];  Service: Endoscopy;;   RECTOCELE REPAIR     TRANSTHORACIC ECHOCARDIOGRAM  2003  EF normal   VAGINAL PROLAPSE REPAIR     VIDEO BRONCHOSCOPY Bilateral 03/08/2017   Procedure: VIDEO BRONCHOSCOPY WITHOUT FLUORO;  Surgeon: Roslynn Amble, MD;  Location: [facility] ENDOSCOPY;  Service: Cardiopulmonary;  Laterality: Bilateral;    ROS: Review of Systems Negative except as stated above  PHYSICAL EXAM: BP 107/69 (BP Location: Left Arm, Patient Position: Sitting, Cuff Size: Large)   Pulse (!) 57   Ht 5\' 8"  (1.727 m)   Wt 260 lb (117.9 kg)   SpO2 100%   BMI 39.53 kg/m   Wt Readings from Last 3 Encounters:  07/15/23 260 lb (117.9 kg)  06/22/23 260 lb (117.9 kg)  06/10/23 259 lb (117.5 kg)    Physical Exam  General appearance - alert, well appearing, obese older African-American female sitting in mobility chair and in no distress Mental status - normal mood, behavior, speech, dress, motor activity, and thought processes Neck - supple, no significant adenopathy Chest - clear to auscultation, no wheezes, rales or rhonchi, symmetric air entry Heart - normal rate, regular rhythm, normal S1, S2, no murmurs, rubs, clicks or gallops Extremities - peripheral pulses normal, no pedal edema, no clubbing or cyanosis Skin -skin under the right breast and along the right side of the abdomen does not appear to have a rash at this time.  Noted is an area of  skin discoloration on the right upper quadrant abdominal wall but this looks like it may be a chronic change that may have been there      Latest Ref Rng & Units 10/06/2022   10:27 AM 11/17/2021    9:41 AM 01/04/2021    3:55 PM  CMP  Glucose 70 - 99 mg/dL 409  82  811   BUN 8 - 27 mg/dL 16  11  10    Creatinine 0.57 - 1.00 mg/dL 9.14  7.82  9.56   Sodium mmol/L CANCELED  143  137   Potassium mmol/L CANCELED  4.4  3.5   Chloride mmol/L CANCELED  103  100   CO2 20 - 29 mmol/L 22  26  29    Calcium 8.7 - 10.3 mg/dL 21.3  9.9  9.6   Total Protein 6.0 - 8.5 g/dL 8.2  7.4  7.9   Total Bilirubin 0.0 - 1.2 mg/dL 0.4  0.4  0.6   Alkaline Phos 44 - 121 IU/L 74  70  53   AST 0 - 40 IU/L 21  20  32   ALT 0 - 32 IU/L 16  20  35    Lipid Panel     Component Value Date/Time   CHOL 140 10/06/2022 1027   TRIG 71 10/06/2022 1027   HDL 58 10/06/2022 1027   CHOLHDL 2.2 07/10/2022 1036   CHOLHDL 2.3 12/16/2015 0950   VLDL 13 12/16/2015 0950   LDLCALC 68 10/06/2022 1027    CBC    Component Value Date/Time   WBC 7.7 07/10/2022 1036   WBC 12.0 (H) 01/04/2021 1555   RBC 4.26 07/10/2022 1036   RBC 4.34 01/04/2021 1555   HGB 12.4 07/10/2022 1036   HCT 38.7 07/10/2022 1036   HCT 41 06/25/2011 1053   PLT 365 07/10/2022 1036   MCV 91 07/10/2022 1036   MCV 91.1 06/25/2011 1053   MCH 29.1 07/10/2022 1036   MCH 29.5 01/04/2021 1555   MCHC 32.0 07/10/2022 1036   MCHC 31.2 01/04/2021 1555   RDW 11.9 07/10/2022 1036   LYMPHSABS 4.6 (H) 01/04/2021 1555   LYMPHSABS  3.4 (H) 01/20/2017 1001   MONOABS 1.6 (H) 01/04/2021 1555   EOSABS 0.1 01/04/2021 1555   EOSABS 0.1 01/20/2017 1001   BASOSABS 0.1 01/04/2021 1555   BASOSABS 0.0 01/20/2017 1001    ASSESSMENT AND PLAN: 1. Type 2 diabetes mellitus with morbid obesity (HCC) At goal.  Continue Trulicity 4.5 mg once a week and metformin. Encouraged her to continue eating smaller portions.  Encouraged her to try to move as much as she can - POCT  glycosylated hemoglobin (Hb A1C) - POCT glucose (manual entry) - atorvastatin (LIPITOR) 40 MG tablet; Take 1 tablet (40 mg total) by mouth daily.  Dispense: 90 tablet; Refill: 2  2. Long-term (current) use of injectable non-insulin antidiabetic drugs See #1 above  3. Diabetes mellitus treated with oral medication (HCC) See #1 above  4. Hypertension associated with diabetes (HCC) At goal.  Continue metoprolol 50 mg twice a day, spironolactone 25 mg daily, Norvasc 10 mg daily, Cozaar 25 mg daily, hydrochlorothiazide 25 mg daily, hydralazine 50 mg twice a day.  - spironolactone (ALDACTONE) 25 MG tablet; Take 1 tablet (25 mg total) by mouth daily.  Dispense: 90 tablet; Refill: 1  5. Hyperlipidemia associated with type 2 diabetes mellitus (HCC) At goal.  Continue atorvastatin  6. Dermatitis Currently I do not see a rash but I suspect she may get intermittent intertrigo.  Discussed the importance of keeping the skin on the both breasts clean and dry.  Will give some nystatin powder to use as needed. - nystatin (MYCOSTATIN/NYSTOP) powder; Apply 1 Application topically 2 (two) times daily.  Dispense: 15 g; Refill: 3  7. Other osteoarthritis involving multiple joints 8. Decreased mobility Patient will schedule follow-up appointment with her orthopedics to inquire whether she has had sufficient weight loss to qualify for joint replacement if needed.  9. Chronic allergic rhinitis Requested refill on Singulair. - montelukast (SINGULAIR) 10 MG tablet; Take 1 tablet (10 mg total) by mouth at bedtime.  Dispense: 90 tablet; Refill: 1  10. Postmenopausal estrogen deficiency - DG Bone Density; Future  11. Rectocele Resubmitted referral to urogynecology  12. Need for vaccination against Streptococcus pneumoniae - Pneumococcal conjugate vaccine 20-valent  13. Encounter for immunization - Flu Vaccine Trivalent High Dose (Fluad)     Patient was given the opportunity to ask questions.  Patient  verbalized understanding of the plan and was able to repeat key elements of the plan.   This documentation was completed using Paediatric nurse.  Any transcriptional errors are unintentional.  Orders Placed This Encounter  Procedures   POCT glycosylated hemoglobin (Hb A1C)   POCT glucose (manual entry)     Requested Prescriptions    No prescriptions requested or ordered in this encounter    No follow-ups on file.  Jonah Blue, MD, FACP

## 2023-07-15 NOTE — Patient Instructions (Signed)
Please call and schedule a follow-up visit with your orthopedic specialist.  Continue to try to move is much as you can.

## 2023-07-19 ENCOUNTER — Ambulatory Visit (INDEPENDENT_AMBULATORY_CARE_PROVIDER_SITE_OTHER): Payer: Medicare Other | Admitting: Clinical

## 2023-07-19 DIAGNOSIS — F341 Dysthymic disorder: Secondary | ICD-10-CM
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
## 2023-07-20 ENCOUNTER — Telehealth: Payer: Self-pay

## 2023-07-20 NOTE — Telephone Encounter (Signed)
Copied from CRM [id number]. Topic: General - Other >> Jul 20, 2023 11:05 AM Everette C wrote: Reason for CRM: The patient has called to request a letter excusing them from jury duty   Please contact the patient further when possible

## 2023-07-21 NOTE — Telephone Encounter (Signed)
Called & spoke to the patient. Verified name & DOB. Informed that letter will be ready for pick-up tomorrow 07/22/23. Patient expressed verbal understanding. No further assistance needed at this time.

## 2023-07-21 NOTE — Telephone Encounter (Signed)
Can pick up letter tomorrow.

## 2023-07-22 ENCOUNTER — Other Ambulatory Visit: Payer: Self-pay | Admitting: Internal Medicine

## 2023-07-22 DIAGNOSIS — E1159 Type 2 diabetes mellitus with other circulatory complications: Secondary | ICD-10-CM

## 2023-07-23 NOTE — Telephone Encounter (Signed)
Requested medication (s) are due for refill today:   No   Requested medication (s) are on the active medication list:   Yes  Future visit scheduled:   Yes 11/18/2023   Last ordered: 03/15/2023 #90, 2 refills    Unable to refill because cr and potassium are due per protocol.      Requested Prescriptions  Pending Prescriptions Disp Refills   losartan (COZAAR) 25 MG tablet [Pharmacy Med Name: LOSARTAN 25MG  TABLET 25 Tablet] 30 tablet 10    Sig: TAKE 1 TABLET BY MOUTH EVERY EVENING     Cardiovascular:  Angiotensin Receptor Blockers Failed - 07/22/2023  7:00 PM      Failed - Cr in normal range and within 180 days    Creat  Date Value Ref Range Status  09/22/2016 0.80 0.50 - 1.05 mg/dL Final    Comment:      For patients > or = 65 years of age: The upper reference limit for Creatinine is approximately 13% higher for people identified as African-American.      Creatinine, Ser  Date Value Ref Range Status  10/06/2022 0.96 0.57 - 1.00 mg/dL Final         Failed - K in normal range and within 180 days    Potassium  Date Value Ref Range Status  10/06/2022 CANCELED mmol/L     Comment:    Test not performed. Due to technical problems in testing, a valid result could not be obtained. The remaining specimen was not sufficient for additional testing.  Result canceled by the ancillary.   06/25/2011 4.2 mmol/L Final         Passed - Patient is not pregnant      Passed - Last BP in normal range    BP Readings from Last 1 Encounters:  07/15/23 107/69         Passed - Valid encounter within last 6 months    Recent Outpatient Visits           1 week ago Type 2 diabetes mellitus with morbid obesity (HCC)   [place] [facility] Jonah Blue B, MD   4 months ago Type 2 diabetes mellitus with morbid obesity [facility])   [place] [facility] Jonah Blue B, MD   8 months ago Type 2 diabetes mellitus with morbid obesity [facility])    [facility] Jonah Blue B, MD   1 year ago Type 2 diabetes mellitus with morbid obesity [facility])   [place] [facility] Jonah Blue B, MD   1 year ago Type 2 diabetes mellitus with morbid obesity [facility])   [place] [facility] Marcine Matar, MD       Future Appointments             In 3 months Laural Benes, Binnie Rail, MD [facility]

## 2023-07-26 ENCOUNTER — Encounter: Payer: Self-pay | Admitting: Bariatrics

## 2023-07-26 ENCOUNTER — Ambulatory Visit (INDEPENDENT_AMBULATORY_CARE_PROVIDER_SITE_OTHER): Payer: Medicare Other | Admitting: Bariatrics

## 2023-07-26 VITALS — BP 135/80 | HR 63 | Ht 68.0 in | Wt 256.0 lb

## 2023-07-26 DIAGNOSIS — F5089 Other specified eating disorder: Secondary | ICD-10-CM | POA: Diagnosis not present

## 2023-07-26 DIAGNOSIS — Z6838 Body mass index (BMI) 38.0-38.9, adult: Secondary | ICD-10-CM | POA: Diagnosis not present

## 2023-07-26 DIAGNOSIS — E1169 Type 2 diabetes mellitus with other specified complication: Secondary | ICD-10-CM

## 2023-07-26 DIAGNOSIS — E66812 Obesity, class 2: Secondary | ICD-10-CM

## 2023-07-26 DIAGNOSIS — Z7985 Long-term (current) use of injectable non-insulin antidiabetic drugs: Secondary | ICD-10-CM

## 2023-07-26 DIAGNOSIS — E669 Obesity, unspecified: Secondary | ICD-10-CM

## 2023-07-26 MED ORDER — TOPIRAMATE 50 MG PO TABS: ORAL_TABLET | ORAL | 0 refills | Status: DC

## 2023-07-26 MED ORDER — TRULICITY 4.5 MG/0.5ML ~~LOC~~ SOAJ
4.5000 mg | SUBCUTANEOUS | 0 refills | Status: DC
Start: 2023-07-26 — End: 2023-08-25

## 2023-07-26 NOTE — Progress Notes (Signed)
WEIGHT SUMMARY AND BIOMETRICS  Weight Lost Since Last Visit: 3lb  Weight Gained Since Last Visit: 0   Vitals BP: 135/80 Pulse Rate: 63 SpO2: 98 %   Anthropometric Measurements Height: 5\' 8"  (1.727 m) Weight: 256 lb (116.1 kg) BMI (Calculated): 38.93 Weight at Last Visit: 259lb Weight Lost Since Last Visit: 3lb Weight Gained Since Last Visit: 0 Starting Weight: 310lb Total Weight Loss (lbs): 54 lb (24.5 kg)   Body Composition  Body Fat %: 50.8 % Fat Mass (lbs): 130.2 lbs Muscle Mass (lbs): 119.8 lbs Total Body Water (lbs): 86.6 lbs Visceral Fat Rating : 16   Other Clinical Data Fasting: no Labs: no Today's Visit #: 27 Starting Date: 06/03/21    OBESITY Destry is here to discuss her progress with her obesity treatment plan along with follow-up of her obesity related diagnoses.     Nutrition Plan: the Category 3 plan - 40% adherence.  Current exercise:  Chair exercises  Interim History:  She is down another 3 lbs.  Eating all of the food on the plan., Protein intake is as prescribed, Water intake is adequate., and Denies polyphagia  Pharmacotherapy: Wendoly is on Trulicity 4.5 mg SQ weekly Adverse side effects: None Hunger is moderately controlled.  Cravings are moderately controlled.  Assessment/Plan:   1. Other disorder of eating Eating disorder/emotional eating Analeise has had issues with stress eating and emotional eating. Currently this is moderately controlled. Overall mood is stable. Denies suicidal/homicidal ideation. Medication(s) Topamax  Plan:  Motivational interviewing as well as evidence-based interventions for health behavior change were utilized today including the discussion of self monitoring techniques, problem-solving barriers and SMART goal setting techniques.  Consider a referral to Dr. Dewaine Conger, PhD psychologist to help with eating behaviors.  Discussed distractions to curb eating behaviors. Discussed activities to  do with one's hands in the evening  Be sure to get adequate rest as lack of rest can trigger appetite.  Have plan in place for stressful events.  Consider other rewards besides food.    2. Type 2 diabetes mellitus with obesity (HCC) Type II Diabetes HgbA1c is at goal. Last A1c was 5.0 CBGs: Fasting 90's       Episodes of hypoglycemia: no Medication(s): Trulicity 4.5 mg SQ weekly  Lab Results  Component Value Date   HGBA1C 5.0 07/15/2023   HGBA1C 5.4 03/15/2023   HGBA1C 5.3 10/06/2022   Lab Results  Component Value Date   LDLCALC 68 10/06/2022   CREATININE 0.96 10/06/2022   Lab Results  Component Value Date   GFR 87.84 03/08/2018    Plan: Continue and refill Trulicity 4.5 mg SQ weekly Continue all other medications.  Will keep all carbohydrates low both sweets and starches.  Will continue exercise regimen to 30 to 60 minutes on most days of the week.  Aim for 7 to 9 hours of sleep nightly.  Eat more low glycemic index foods.    Morbid Obesity: Current BMI BMI (Calculated): 38.93   Pharmacotherapy Plan Continue and refill  Trulicity 4.5 mg SQ weekly  Mylissa is currently in the action stage of change. As such, her goal is to continue with weight loss efforts.  She has agreed to the Category 3 plan.  Exercise goals: Older adults should do exercises that maintain or improve balance if they are at risk of falling.   Behavioral modification strategies: increasing lean protein intake, decreasing simple carbohydrates , no meal skipping, increase water intake, better snacking choices, decreasing sodium intake, and mindful eating.  Joda has agreed to follow-up with our clinic in 4 weeks.    Objective:   VITALS: Per patient if applicable, see vitals. GENERAL: Alert and in no acute distress. CARDIOPULMONARY: No increased WOB. Speaking in clear sentences.  PSYCH: Pleasant and cooperative. Speech normal rate and rhythm. Affect is appropriate. Insight and judgement are  appropriate. Attention is focused, linear, and appropriate.  NEURO: Oriented as arrived to appointment on time with no prompting.   Attestation Statements:   This was prepared with the assistance of Engineer, civil (consulting).  Occasional wrong-word or sound-a-like substitutions may have occurred due to the inherent limitations of voice recognition software.  Corinna Capra, DO

## 2023-07-28 ENCOUNTER — Other Ambulatory Visit: Payer: Self-pay | Admitting: Bariatrics

## 2023-07-28 DIAGNOSIS — F5089 Other specified eating disorder: Secondary | ICD-10-CM

## 2023-07-29 ENCOUNTER — Other Ambulatory Visit: Payer: Self-pay | Admitting: Bariatrics

## 2023-07-29 DIAGNOSIS — E1169 Type 2 diabetes mellitus with other specified complication: Secondary | ICD-10-CM

## 2023-08-03 ENCOUNTER — Ambulatory Visit (INDEPENDENT_AMBULATORY_CARE_PROVIDER_SITE_OTHER): Payer: Medicare Other | Admitting: Clinical

## 2023-08-03 DIAGNOSIS — F341 Dysthymic disorder: Secondary | ICD-10-CM

## 2023-08-03 NOTE — Progress Notes (Signed)
   THERAPIST PROGRESS NOTE  Session Time: 30 minutes  Participation Level: Active  Behavioral Response: CasualAlertDepressed  Type of Therapy: Individual Therapy  Treatment Goals addressed:  [name] WILL PARTICIPATE IN AT LEAST 80% OF SCHEDULED INDIVIDUAL PSYCHOTHERAPY SESSIONS   ProgressTowards Goals: Progressing  Interventions: CBT and Supportive  Summary:  Robin [name] is a 67 y.o. female who presents for the scheduled appointment oriented x 5, appropriately dressed, and friendly. Client reported on today a lot has been going on. Client reported her to eldest daughters have been in discussion about the pending decision of changes that will be occurring for her. Client reported her daughters decided that it would be best that she moved up [place] with her husband as he is intending for them to do. Client reported she feels like he has an agenda of his sleep once they do move.  Client reported she is not sure if he is still dealing with that woman but suspects that he is because she is originally from where he wants them to move to.  Client reported her daughter did give her the support of helping her to obtain an apartment if their marriage does not improve once they do move. Client reported that is reassuring. Client reported she has been somewhat worried about her youngest daughter in [place]. Client reported doctors want to do a colonoscopy because of her stomach issues and weight loss. Evidence of progress towards goal:  client reported 1 positive of problem solving her issues with her children who support her.   Suicidal/Homicidal: Nowithout intent/plan  Therapist Response:  Therapist began the appointment asking the client how she has been doing since last seen. Therapist used CBT to engage using active listening and positive emotional support. Therapist used CBT to ask the client open-ended questions about her thoughts and feelings pertaining to her stressors currently. Therapist  used CBT to normalize the clients emotions and discussed problem solving steps utilizing her supports. Therapist used CBT ask the client to identify her progress with frequency of use with coping skills with continued practice in her daily activity.      Plan: Return again in 3 weeks.  Diagnosis: Persistent depressive disorder  Collaboration of Care: Patient refused AEB none requested by the client.  Patient/Guardian was advised Release of Information must be obtained prior to any record release in order to collaborate their care with an outside provider. Patient/Guardian was advised if they have not already done so to contact the registration department to sign all necessary forms in order for [name] to release information regarding their care.   Consent: Patient/Guardian gives verbal consent for treatment and assignment of benefits for services provided during this visit. Patient/Guardian expressed understanding and agreed to proceed.   Neena Rhymes Clarke [place], LCSW 08/03/2023

## 2023-08-11 ENCOUNTER — Encounter (HOSPITAL_COMMUNITY): Payer: Self-pay

## 2023-08-11 ENCOUNTER — Ambulatory Visit (HOSPITAL_COMMUNITY): Payer: Medicare Other | Admitting: Clinical

## 2023-08-17 ENCOUNTER — Encounter (HOSPITAL_COMMUNITY): Payer: Self-pay | Admitting: Psychiatry

## 2023-08-17 ENCOUNTER — Ambulatory Visit (INDEPENDENT_AMBULATORY_CARE_PROVIDER_SITE_OTHER): Payer: Medicare Other | Admitting: Psychiatry

## 2023-08-17 DIAGNOSIS — F341 Dysthymic disorder: Secondary | ICD-10-CM

## 2023-08-17 DIAGNOSIS — F411 Generalized anxiety disorder: Secondary | ICD-10-CM | POA: Diagnosis not present

## 2023-08-17 MED ORDER — DULOXETINE HCL 60 MG PO CPEP
60.0000 mg | ORAL_CAPSULE | Freq: Two times a day (BID) | ORAL | 1 refills | Status: DC
Start: 2023-08-17 — End: 2023-10-26

## 2023-08-17 MED ORDER — BUPROPION HCL ER (XL) 300 MG PO TB24
300.0000 mg | ORAL_TABLET | Freq: Every day | ORAL | 1 refills | Status: DC
Start: 2023-08-17 — End: 2023-10-26

## 2023-08-17 NOTE — Patient Instructions (Signed)
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
## 2023-08-24 ENCOUNTER — Ambulatory Visit (INDEPENDENT_AMBULATORY_CARE_PROVIDER_SITE_OTHER): Payer: Medicare Other | Admitting: Clinical

## 2023-08-24 DIAGNOSIS — F341 Dysthymic disorder: Secondary | ICD-10-CM | POA: Diagnosis not present
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
## 2023-08-25 ENCOUNTER — Ambulatory Visit: Payer: Medicare Other | Admitting: Bariatrics

## 2023-08-25 ENCOUNTER — Encounter: Payer: Self-pay | Admitting: Bariatrics

## 2023-08-25 VITALS — BP 129/78 | HR 62 | Temp 98.1°F | Ht 68.0 in | Wt 252.0 lb

## 2023-08-25 DIAGNOSIS — E669 Obesity, unspecified: Secondary | ICD-10-CM

## 2023-08-25 DIAGNOSIS — F5089 Other specified eating disorder: Secondary | ICD-10-CM | POA: Diagnosis not present

## 2023-08-25 DIAGNOSIS — E1169 Type 2 diabetes mellitus with other specified complication: Secondary | ICD-10-CM | POA: Diagnosis not present

## 2023-08-25 DIAGNOSIS — E66812 Obesity, class 2: Secondary | ICD-10-CM

## 2023-08-25 DIAGNOSIS — Z7985 Long-term (current) use of injectable non-insulin antidiabetic drugs: Secondary | ICD-10-CM

## 2023-08-25 DIAGNOSIS — Z6838 Body mass index (BMI) 38.0-38.9, adult: Secondary | ICD-10-CM | POA: Diagnosis not present

## 2023-08-25 MED ORDER — TOPIRAMATE 50 MG PO TABS: ORAL_TABLET | ORAL | 0 refills | Status: AC

## 2023-08-25 MED ORDER — TRULICITY 4.5 MG/0.5ML ~~LOC~~ SOAJ: 4.5000 mg | SUBCUTANEOUS | 0 refills | Status: DC

## 2023-08-25 NOTE — Progress Notes (Signed)
WEIGHT SUMMARY AND BIOMETRICS  Weight Lost Since Last Visit: 4lb  Weight Gained Since Last Visit: 0   Vitals Temp: 98.1 F (36.7 C) BP: 129/78 Pulse Rate: 62 SpO2: 100 %   Anthropometric Measurements Height: 5\' 8"  (1.727 m) Weight: 252 lb (114.3 kg) BMI (Calculated): 38.33 Weight at Last Visit: 256lb Weight Lost Since Last Visit: 4lb Weight Gained Since Last Visit: 0 Starting Weight: 310lb Total Weight Loss (lbs): 58 lb (26.3 kg)   Body Composition  Body Fat %: 50.2 % Fat Mass (lbs): 127 lbs Muscle Mass (lbs): 119.4 lbs Total Body Water (lbs): 84.2 lbs Visceral Fat Rating : 16   Other Clinical Data Fasting: no Labs: no Today's Visit #: 28 Starting Date: 06/03/21    OBESITY Ayden is here to discuss her progress with her obesity treatment plan along with follow-up of her obesity related diagnoses.    Nutrition Plan: the Category 3 plan - 0% adherence.  Current exercise: none  Interim History:  She is down another 4 lbs, but has done some stress eating due to having to move back to [place]. Her husband is eager to go, but she is not.  Eating all of the food on the plan., Protein intake is as prescribed, Is exceeding snack calorie allotment, Is not skipping meals, Meeting protein goals., Water intake is adequate., Denies polyphagia, and Reports excessive cravings.   Pharmacotherapy: Shontina is on Trulicity 4.5 mg SQ weekly Adverse side effects: None Hunger is moderately controlled.  Cravings are moderately controlled.  Assessment/Plan:   1. Other disorder of eating  Karenann has had issues with stress eating, emotional eating, nighttime eating, and boredom eating. Currently this is poorly controlled. Overall mood is stable. Denies suicidal/homicidal ideation. Medication(s): Topamax 50 mg tablet in the am, and 2 (50 mg) tablets in the pm.    Plan:  Specifically regarding patient's less desirable eating habits and patterns, we employed the technique of small changes when she cannot fully commit to her prudent nutritional plan. Consider a referral to Dr. Dewaine Conger, PhD psychologist to help with eating behaviors.  Discussed distractions to curb eating behaviors. Discussed activities to do with one's hands in the evening  Be sure to get adequate rest as lack of rest can trigger appetite.  Have plan in place for stressful events.  Consider other rewards besides food.    2. Type 2 diabetes mellitus with obesity (HCC)  HgbA1c is at goal. Last A1c was 5.0 CBGs: Fasting less than 100        Episodes of hypoglycemia: no Medication(s): Trulicity 4.5 mg SQ weekly  Lab Results  Component Value Date   HGBA1C 5.0 07/15/2023   HGBA1C 5.4 03/15/2023   HGBA1C 5.3 10/06/2022   Lab Results  Component Value Date   LDLCALC 68 10/06/2022   CREATININE 0.96 10/06/2022   Lab Results  Component Value Date  GFR 87.84 03/08/2018    Plan: Continue and refill Trulicity 4.5 mg SQ weekly Continue all other medications.  Will keep all carbohydrates low both sweets and starches.  Will continue exercise regimen to 30 to 60 minutes on most days of the week.  Aim for 7 to 9 hours of sleep nightly.  Eat more low glycemic index foods.    Generalized Obesity: Current BMI BMI (Calculated): 38.33   Pharmacotherapy Plan Continue and refill  Trulicity 4.5 mg SQ weekly  Myndi is currently in the action stage of change. As such, her goal is to continue with weight loss efforts.  She has agreed to the Category 3 plan.  Exercise goals: Will continue some chair exercises.   Behavioral modification strategies: increasing lean protein intake, no meal skipping, meal planning , increase water intake, better snacking choices, planning for success, increasing vegetables, get rid of junk food in the home, holiday eating strategies , weigh protein portions,  and mindful eating.  Florian has agreed to follow-up with our clinic in 4 weeks.      Objective:   VITALS: Per patient if applicable, see vitals. GENERAL: Alert and in no acute distress. CARDIOPULMONARY: No increased WOB. Speaking in clear sentences.  PSYCH: Pleasant and cooperative. Speech normal rate and rhythm. Affect is appropriate. Insight and judgement are appropriate. Attention is focused, linear, and appropriate.  NEURO: Oriented as arrived to appointment on time with no prompting.   Attestation Statements:    This was prepared with the assistance of Engineer, civil (consulting).  Occasional wrong-word or sound-a-like substitutions may have occurred due to the inherent limitations of voice recognition   Corinna Capra, DO

## 2023-08-31 ENCOUNTER — Encounter: Payer: Self-pay | Admitting: Podiatry

## 2023-08-31 ENCOUNTER — Ambulatory Visit (INDEPENDENT_AMBULATORY_CARE_PROVIDER_SITE_OTHER): Payer: Medicare Other | Admitting: Podiatry

## 2023-08-31 DIAGNOSIS — E1142 Type 2 diabetes mellitus with diabetic polyneuropathy: Secondary | ICD-10-CM

## 2023-08-31 DIAGNOSIS — B351 Tinea unguium: Secondary | ICD-10-CM | POA: Diagnosis not present

## 2023-08-31 DIAGNOSIS — M79675 Pain in left toe(s): Secondary | ICD-10-CM | POA: Diagnosis not present

## 2023-08-31 DIAGNOSIS — M79674 Pain in right toe(s): Secondary | ICD-10-CM

## 2023-08-31 NOTE — Progress Notes (Signed)
  Subjective:  Patient ID: Robin [name], female    DOB: 11-12-1955,   MRN: [id number]  Chief Complaint  Patient presents with   Nail Problem    Dfc.    67 y.o. female presents for concern of thickened elongated and painful nails that are difficult to trim. Requesting to have them trimmed today. Relates burning and tingling in their feet. Patient is diabetic and last A1c was  Lab Results  Component Value Date   HGBA1C 5.0 07/15/2023   .   PCP:  Marcine Matar, MD    . Denies any other pedal complaints. Denies n/v/f/c.   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   Anemia    Anxiety disorder    Arthritis    Asthma    Back pain    Chronic neck pain    Colon polyps    Constipation    Depression    Diabetes mellitus without complication (HCC)    Dyspnea    Edema of lower extremity    Fibromyalgia    GERD (gastroesophageal reflux disease)    Heartburn    Hiatal hernia    small   Hyperlipidemia    Hypertension    Hypothyroidism    Joint pain    Morbid obesity with BMI of 50.0-59.9, adult (HCC)    Osteoarthritis    Other fatigue    Schatzki's ring    non critical   Severe episode of recurrent major depressive disorder, 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 CFT <3 seconds. Absent hair growth on digits. Edema noted to bilateral lower extremities. Xerosis noted bilaterally.  Skin. No lacerations or abrasions bilateral feet. Nails 1-5 bilateral  are thickened discolored and elongated with subungual debris.  Musculoskeletal: MMT 5/5 bilateral lower extremities in DF, PF, Inversion and Eversion. Deceased ROM in DF of ankle joint.   Neurological: Sensation intact to light touch. Protective sensation intact bilateral.    Assessment:   1. Pain due to onychomycosis of toenails of both feet   2. Diabetic peripheral neuropathy associated with type 2 diabetes mellitus (HCC)      Plan:  Patient was evaluated and treated and all questions answered. -Discussed and educated patient on diabetic foot care, especially with  regards to the vascular, neurological and musculoskeletal systems.  -Stressed the importance of good glycemic control and the detriment of not  controlling glucose levels in relation to the foot. -Discussed supportive shoes at all times and checking feet regularly.  -Mechanically debrided all nails 1-5 bilateral using sterile nail nipper and filed with dremel without incident  -Answered all patient questions -Patient to return  in 3 months for at risk foot care -Patient advised to call the office if any problems or questions arise in the meantime.   Louann Sjogren, DPM

## 2023-09-07 ENCOUNTER — Ambulatory Visit (INDEPENDENT_AMBULATORY_CARE_PROVIDER_SITE_OTHER): Payer: Medicare Other | Admitting: Clinical

## 2023-09-07 ENCOUNTER — Other Ambulatory Visit: Payer: Self-pay | Admitting: Bariatrics

## 2023-09-07 ENCOUNTER — Other Ambulatory Visit: Payer: Self-pay | Admitting: Internal Medicine

## 2023-09-07 DIAGNOSIS — E1169 Type 2 diabetes mellitus with other specified complication: Secondary | ICD-10-CM

## 2023-09-07 DIAGNOSIS — E669 Obesity, unspecified: Secondary | ICD-10-CM

## 2023-09-07 DIAGNOSIS — F341 Dysthymic disorder: Secondary | ICD-10-CM

## 2023-09-07 DIAGNOSIS — E1159 Type 2 diabetes mellitus with other circulatory complications: Secondary | ICD-10-CM

## 2023-09-07 DIAGNOSIS — F5089 Other specified eating disorder: Secondary | ICD-10-CM

## 2023-09-07 NOTE — Progress Notes (Signed)
   THERAPIST PROGRESS NOTE  Session Time: 40 minutes  Participation Level: Active  Behavioral Response: CasualAlertDepressed  Type of Therapy: Individual Therapy  Treatment Goals addressed: [name] WILL PARTICIPATE IN AT LEAST 80% OF SCHEDULED INDIVIDUAL PSYCHOTHERAPY SESSIONS   ProgressTowards Goals: Progressing  Interventions: CBT and Supportive  Summary:  Robin [name] is a 67 y.o. female who presents for the scheduled appointment oriented time five, appropriately dressed and friendly. Client denied hallucinations and delusions. Client reported she is doing about the same. Client reported she is not happy about the moving process. Client reported her husband does not and has not asked for her opinion on anything. Client reported she found out that he put their furniture on facebook marketplace to sell without consulting her. Client reported her daughter saw it and called her as well. Client reported it is odd for him to sell off most of their furniture and the house is not close to being sold yet. Client reported she feels he has a hidden agenda that she is not aware of. Client reported otherwise her daughter and husband brought over food for thanksgiving. Client reported her other children had good thanksgivings where they were at. Client reported her husband said they will not stay in [place] past January but will see how that unfolds. Evidence of progress towards goal:  client reported 1 positive of having some family support from her children.   Suicidal/Homicidal: Nowithout intent/plan  Therapist Response:  Therapist began the appointment asking the client how she has been doing. Therapist used cbt to engage using active listening and positive emotional support. Therapist used cbt to engage and give the client time to discuss her thoughts and feelings about her stress at home. Therapist used cbt to engage and normalize her thoughts and feelings.  Therapist used CBT ask the client to  identify her progress with frequency of use with coping skills with continued practice in her daily activity.    Therapist assigned the client homework to practice self care.   Plan: Return again in 3 weeks.  Diagnosis: persistent depressive disorder  Collaboration of Care: Patient refused AEB none requested by the client.  Patient/Guardian was advised Release of Information must be obtained prior to any record release in order to collaborate their care with an outside provider. Patient/Guardian was advised if they have not already done so to contact the registration department to sign all necessary forms in order for [name] to release information regarding their care.   Consent: Patient/Guardian gives verbal consent for treatment and assignment of benefits for services provided during this visit. Patient/Guardian expressed understanding and agreed to proceed.   Neena Rhymes Alayah Knouff, LCSW 09/07/2023

## 2023-09-08 ENCOUNTER — Institutional Professional Consult (permissible substitution): Payer: MEDICAID | Admitting: Adult Health

## 2023-09-08 ENCOUNTER — Other Ambulatory Visit: Payer: Self-pay | Admitting: Bariatrics

## 2023-09-08 DIAGNOSIS — F5089 Other specified eating disorder: Secondary | ICD-10-CM

## 2023-09-08 DIAGNOSIS — E669 Obesity, unspecified: Secondary | ICD-10-CM

## 2023-09-15 ENCOUNTER — Telehealth: Payer: Self-pay | Admitting: Physician Assistant

## 2023-09-15 NOTE — Telephone Encounter (Signed)
Returned call to the patient regarding Linzess and insurance changes effective in January.  Patient advised that samples of Linzess will be left at the front desk.  Patient advised that samples could not be provided throughout the rest of the year, but she could try taking Linzess every other day or try to get samples from PCP as well with the intention of continuing medication until new insurance takes effect in the new year..  Patient agreed to plan and verbalized understanding.

## 2023-09-15 NOTE — Telephone Encounter (Signed)
Inbound call from patient stating she recently changed insurances and linzess medication currently has a higher co pay until [place] when her new insurance is activated. Patient requesting a call back to discuss other options for linzess. Please advise, thank you.

## 2023-09-16 ENCOUNTER — Ambulatory Visit: Payer: Medicare Other | Attending: Physician Assistant | Admitting: Physician Assistant

## 2023-09-16 ENCOUNTER — Encounter: Payer: Self-pay | Admitting: Physician Assistant

## 2023-09-16 VITALS — BP 116/76 | HR 62 | Wt 258.2 lb

## 2023-09-16 DIAGNOSIS — Z7984 Long term (current) use of oral hypoglycemic drugs: Secondary | ICD-10-CM

## 2023-09-16 DIAGNOSIS — E038 Other specified hypothyroidism: Secondary | ICD-10-CM | POA: Diagnosis not present

## 2023-09-16 DIAGNOSIS — I998 Other disorder of circulatory system: Secondary | ICD-10-CM

## 2023-09-16 DIAGNOSIS — E1159 Type 2 diabetes mellitus with other circulatory complications: Secondary | ICD-10-CM | POA: Diagnosis not present

## 2023-09-16 DIAGNOSIS — R519 Headache, unspecified: Secondary | ICD-10-CM

## 2023-09-16 DIAGNOSIS — E1169 Type 2 diabetes mellitus with other specified complication: Secondary | ICD-10-CM

## 2023-09-16 DIAGNOSIS — I152 Hypertension secondary to endocrine disorders: Secondary | ICD-10-CM

## 2023-09-16 MED ORDER — ALBUTEROL SULFATE HFA 108 (90 BASE) MCG/ACT IN AERS: INHALATION_SPRAY | RESPIRATORY_TRACT | 6 refills | Status: DC

## 2023-09-16 MED ORDER — ALBUTEROL SULFATE HFA 108 (90 BASE) MCG/ACT IN AERS: INHALATION_SPRAY | RESPIRATORY_TRACT | 6 refills | Status: AC

## 2023-09-16 MED ORDER — LEVOTHYROXINE SODIUM 88 MCG PO TABS: 88.0000 ug | ORAL_TABLET | Freq: Every day | ORAL | 1 refills | Status: DC

## 2023-09-16 MED ORDER — METFORMIN HCL 500 MG PO TABS: ORAL_TABLET | ORAL | 3 refills | Status: AC

## 2023-09-16 MED ORDER — FLUTICASONE PROPIONATE 50 MCG/ACT NA SUSP: 1.0000 | Freq: Every day | NASAL | 1 refills | Status: DC

## 2023-09-16 MED ORDER — LOSARTAN POTASSIUM 25 MG PO TABS: 25.0000 mg | ORAL_TABLET | Freq: Every evening | ORAL | 0 refills | Status: DC

## 2023-09-16 NOTE — Patient Instructions (Signed)
Drink 64 to 80 ounces water daily  Continue checking blood pressures daily.  Fluctuations in blood pressure throughout the day can be normal.  Blood pressures can be up or down some based on stress level, water and food intake, etc. Many/most of your Bp are normal from the readings we reviewed.  Some are a little high and a couple were a little low.  I would consider the numbers we reviewed 90-140 on top/60s-90s on the bottom to be normal fluctuations.  Your averages are normal which is great!  If your blood pressure is reading lower on a specific day and you feel weak, make sure you are drinking water and eating, and  you could hold your second dose of hydralazine for that day then resume normal dosing the next day.

## 2023-09-16 NOTE — Progress Notes (Signed)
Patient ID: Robin [name], female   DOB: 1956/07/28, 67 y.o.   MRN: [id number]   Shakea Bonillas, is a 67 y.o. female  GHW:[id number]  [id number]:[id number]  DOB - 1956-03-28  Chief Complaint  Patient presents with   Hypertension       Subjective:   Robin [name] is a 67 y.o. female here today for concerns with fluctuating blood pressures.  She checks her BP daily and she has noticed some ups and downs.  She brought her BP cuff with her so we can review the readings.  She has been getting 90-140 systolic/60s-90s diastolic.  She did feel light headed when her BP was the lowest at 90/60.  She admits to not drinking water and sometimes skipping meals.  She denies CP/SOB.  She stopped taking all of her meds for several days after getting the low BP then BP started going up rapidly.  She resumed her meds.  She is also having sinus pressure and sinus HA   No problems updated.  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   Anemia    Anxiety disorder    Arthritis    Asthma    Back pain    Chronic neck pain    Colon polyps    Constipation    Depression    Diabetes mellitus without complication (HCC)    Dyspnea    Edema of lower extremity    Fibromyalgia    GERD (gastroesophageal reflux disease)    Heartburn    Hiatal hernia    small   Hyperlipidemia    Hypertension    Hypothyroidism    Joint pain    Morbid obesity with BMI of 50.0-59.9, adult (HCC)    Osteoarthritis    Other fatigue    Schatzki's ring    non critical   Severe episode of recurrent major depressive disorder, with psychotic  features (HCC) 05/26/2018   Sinusitis    Sleep apnea    CPAP, Sleep study at [facility]   SOB (shortness of breath) on exertion    Status post dilation of esophageal narrowing    Swallowing difficulty     MEDICATIONS AT HOME: Prior to Admission medications   Medication Sig Start Date End Date Taking? Authorizing Provider  atorvastatin (LIPITOR) 40 MG tablet Take 1 tablet (40 mg total) by mouth daily. 07/15/23  Yes Marcine Matar, MD  buPROPion (WELLBUTRIN XL) 300 MG 24 hr tablet Take 1 tablet (300 mg total) by mouth daily. 08/17/23 02/13/24 Yes Bahraini, Sarah A  DULoxetine (CYMBALTA) 60 MG capsule Take 1 capsule (60 mg total) by mouth 2 (two) times daily. 08/17/23 02/13/24 Yes Bahraini, Sarah A  esomeprazole (NEXIUM) 40 MG capsule Take 1 capsule (40 mg total) by mouth 2 (two) times daily. 03/15/23  Yes Marcine Matar, MD  famotidine (PEPCID) 20 MG tablet Take 1 tablet (20 mg total) by mouth 2 (two) times daily. 06/10/23  Yes Esterwood, Amy S, PA-C  gabapentin (NEURONTIN) 600 MG tablet TAKE ONE TABLET BY MOUTH THREE TIMES DAILY 04/12/23  Yes Marcine Matar, MD  hydrALAZINE (APRESOLINE)  50 MG tablet TAKE ONE TABLET BY MOUTH EVERY MORNING and TAKE ONE TABLET BY MOUTH EVERY EVENING 11/12/22  Yes Marcine Matar, MD  hydrochlorothiazide (HYDRODIURIL) 25 MG tablet TAKE ONE TABLET BY MOUTH EVERY MORNING 12/10/22  Yes Marcine Matar, MD  metoprolol tartrate (LOPRESSOR) 25 MG tablet TAKE ONE (1) TABLET BY MOUTH TWICE DAILY *NEW DOSE, USE IN NEXT PACKS* 09/08/23  Yes Marcine Matar, MD  montelukast (SINGULAIR) 10 MG tablet Take 1 tablet (10 mg total) by mouth at bedtime. 07/15/23  Yes Marcine Matar, MD  spironolactone (ALDACTONE) 25 MG tablet Take 1 tablet (25 mg total) by mouth daily. 07/15/23  Yes Marcine Matar, MD  topiramate (TOPAMAX) 50 MG tablet TAKE ONE TABLET BY MOUTH EVERY MORNING and TAKE TWO TABLETS EVERY EVENING 08/25/23  Yes Corinna Capra A, DO  ACCU-CHEK  GUIDE TEST test strip USE TO TEST BLOOD SUGAR ONCE DAILY AS DIRECTED Patient not taking: Reported on 09/16/2023 09/08/23   Marcine Matar, MD  Accu-Chek Softclix Lancets lancets USE TO TEST BLOOD SUGAR ONCE DAILY AS DIRECTED Patient not taking: Reported on 09/16/2023 09/08/23   Marcine Matar, MD  albuterol (VENTOLIN HFA) 108 (90 Base) MCG/ACT inhaler INHALE 1-2 PUFFS BY MOUTH into THE lungs EVERY 6 HOURS AS NEEDED FOR WHEEZING AND/OR SHORTNESS OF BREATH 09/16/23   Georgian Co M, PA-C  amLODipine (NORVASC) 10 MG tablet Take 1 tablet by mouth daily. Patient not taking: Reported on 09/16/2023    [provider]  aspirin EC 81 MG tablet Take 81 mg by mouth at bedtime. Patient not taking: Reported on 09/16/2023    [provider]  azelastine (ASTELIN) 0.1 % nasal spray 1-2 sprays per nostril 2 times daily as needed Patient not taking: Reported on 09/16/2023 06/17/20   Bobbitt, Heywood Iles, MD  Blood Glucose Monitoring Suppl (ACCU-CHEK GUIDE) w/Device KIT USE TO TEST BLOOD SUGAR AS DIRECTED Patient not taking: Reported on 09/16/2023 06/10/23   Marcine Matar, MD  CALCIUM CARBONATE ANTACID PO Take 2 tablets by mouth See admin instructions. Take 2 tablets by mouth every morning, may also take 2 tablets at night as needed for acid reflux Patient not taking: Reported on 09/16/2023    [provider]  celecoxib (CELEBREX) 100 MG capsule TAKE ONE CAPSULE BY MOUTH TWICE DAILY Patient not taking: Reported on 09/16/2023 12/10/22   Marcine Matar, MD  CINNAMON PO Take 1 capsule by mouth daily. Patient not taking: Reported on 09/16/2023    [provider]  fexofenadine (ALLEGRA) 180 MG tablet Take 1 tablet (180 mg total) by mouth daily. Patient not taking: Reported on 09/16/2023 07/10/22   Marcine Matar, MD  fluticasone [facility]) 50 MCG/ACT nasal spray Place 1 spray into both nostrils daily. 09/16/23   Anders Simmonds, PA-C  levothyroxine (SYNTHROID) 88  MCG tablet Take 1 tablet (88 mcg total) by mouth daily. 09/16/23   Anders Simmonds, PA-C  LINZESS 290 MCG CAPS capsule TAKE 1 CAPSULE BY MOUTH BEFORE BREAKFAST *REFILL REQUEST* Patient not taking: Reported on 09/16/2023 06/08/23   Jenel Lucks, MD  losartan (COZAAR) 25 MG tablet Take 1 tablet (25 mg total) by mouth every evening. 09/16/23   Anders Simmonds, PA-C  metFORMIN (GLUCOPHAGE) 500 MG tablet TAKE TWO TABLETS BY MOUTH EVERY MORNING and TAKE ONE TABLET BY MOUTH EVERY EVENING 09/16/23   Anders Simmonds, PA-C  Multiple Vitamin (MULTIVITAMIN WITH MINERALS) TABS tablet Take 1 tablet by mouth daily. Patient not  taking: Reported on 09/16/2023    [provider]  [facility] 4 MG/0.1ML LIQD nasal spray kit  08/16/20   [provider]  NEOMYCIN-POLYMYXIN-HYDROCORTISONE (CORTISPORIN) 1 % SOLN OTIC solution Place 3 drops into the left ear 4 (four) times daily. Patient not taking: Reported on 09/16/2023 03/12/22   Marcine Matar, MD  nystatin (MYCOSTATIN/NYSTOP) powder Apply 1 Application topically 2 (two) times daily. Patient not taking: Reported on 09/16/2023 07/15/23   Marcine Matar, MD  Omega-3 Fatty Acids (FISH OIL) 1000 MG CAPS Take 1,000 mg by mouth at bedtime. Patient not taking: Reported on 09/16/2023    [provider]  ondansetron (ZOFRAN-ODT) 8 MG disintegrating tablet Take 1 tablet (8 mg total) by mouth every 8 (eight) hours as needed for nausea or vomiting. Patient not taking: Reported on 09/16/2023 03/04/23   Mayers, Cari S, PA-C  Sodium Chloride-Sodium Bicarb (NETI POT SINUS WASH) 2300-700 MG KIT DO nasal rinse twice a week as needed Patient not taking: Reported on 09/16/2023 03/10/22   Marcine Matar, MD  tiZANidine (ZANAFLEX) 2 MG tablet Take by mouth. Patient not taking: Reported on 09/16/2023 01/09/21   [provider]  vitamin B-12 (CYANOCOBALAMIN) 1000 MCG tablet Take 1,000 mcg by mouth daily. Patient not taking: Reported on  09/16/2023    [provider]  amitriptyline (ELAVIL) 50 MG tablet TAKE 1 TABLET BY MOUTH AT BEDTIME. 10/15/20 11/29/20  Marcine Matar, MD    ROS: Neg HEENT Neg resp Neg cardiac Neg GI Neg GU Neg MS Neg psych Neg neuro  Objective:   Vitals:   09/16/23 1525  BP: 116/76  Pulse: 62  SpO2: 100%  Weight: 258 lb 3.2 oz (117.1 kg)   Exam General appearance : Awake, alert, not in any distress. Speech Clear. Not toxic looking in motorized chair HEENT: Atraumatic and Normocephalic Neck: Supple, no JVD. No cervical lymphadenopathy.  Chest: Good air entry bilaterally, CTAB.  No rales/rhonchi/wheezing CVS: S1 S2 regular Extremities: B/L Lower Ext shows no edema, both legs are warm to touch Neurology: Awake alert, and oriented X 3, CN II-XII intact, Non focal Skin: No Rash  Data Review Lab Results  Component Value Date   HGBA1C 5.0 07/15/2023   HGBA1C 5.4 03/15/2023   HGBA1C 5.3 10/06/2022    Assessment & Plan   1. Fluctuating blood pressure  Spent ~30 mins reviewing the last 30-40 days BP on her monitor.  Base on the readings, they are actually overall very good with minor fluctuations. Instructions to patient: Drink 64 to 80 ounces water daily Continue checking blood pressures daily.  Fluctuations in blood pressure throughout the day can be normal.  Blood pressures can be up or down some based on stress level, water and food intake, etc. Many/most of your Bp are normal from the readings we reviewed.  Some are a little high and a couple were a little low.  I would consider the numbers we reviewed 90-140 on top/60s-90s on the bottom to be normal fluctuations.  Your averages are normal which is great!  If your blood pressure is reading lower on a specific day and you feel weak, make sure you are drinking water and eating, and  you could hold your second dose of hydralazine for that day then resume normal dosing the next day.   - Comprehensive metabolic panel - Thyroid  Panel With TSH - CBC with Differential  2. Sinus headache (Primary) - fluticasone (FLONASE) 50 MCG/ACT nasal spray; Place 1 spray into both  nostrils daily.  Dispense: 16 g; Refill: 1  3. Hypertension associated with diabetes [facility]) Reviewed last med list by PCP 07/15/2023.  Continue current regimen - Comprehensive metabolic panel - CBC with Differential - losartan (COZAAR) 25 MG tablet; Take 1 tablet (25 mg total) by mouth every evening.  Dispense: 90 tablet; Refill: 0  Instructions to patient: Drink 64 to 80 ounces water daily  Continue checking blood pressures daily.  Fluctuations in blood pressure throughout the day can be normal.  Blood pressures can be up or down some based on stress level, water and food intake, etc. Many/most of your Bp are normal from the readings we reviewed.  Some are a little high and a couple were a little low.  I would consider the numbers we reviewed 90-140 on top/60s-90s on the bottom to be normal fluctuations.  Your averages are normal which is great!  If your blood pressure is reading lower on a specific day and you feel weak, make sure you are drinking water and eating, and  you could hold your second dose of hydralazine for that day then resume normal dosing the next day.    5. Other specified hypothyroidism - Comprehensive metabolic panel - Thyroid Panel With TSH - CBC with Differential - levothyroxine (SYNTHROID) 88 MCG tablet; Take 1 tablet (88 mcg total) by mouth daily.  Dispense: 90 tablet; Refill: 1  6. Type 2 diabetes mellitus with morbid obesity (HCC) - metFORMIN (GLUCOPHAGE) 500 MG tablet; TAKE TWO TABLETS BY MOUTH EVERY MORNING and TAKE ONE TABLET BY MOUTH EVERY EVENING  Dispense: 270 tablet; Refill: 3    Return in about 3 months (around 12/15/2023) for PCP for chronic conditions dr Laural Benes.  The patient was given clear instructions to go to ER or return to medical center if symptoms don't improve, worsen or new problems develop. The patient  verbalized understanding. The   patient was told to call to get lab results if they haven't heard anything in the next week.      Georgian Co, PA-C [facility] and [facility] [place], [place] [phone]   09/16/2023, 4:09 PM

## 2023-09-17 LAB — CBC WITH DIFFERENTIAL/PLATELET
Basophils Absolute: 0 10*3/uL (ref 0.0–0.2)
Basos: 1 %
EOS (ABSOLUTE): 0.1 10*3/uL (ref 0.0–0.4)
Eos: 1 %
Hematocrit: 39 % (ref 34.0–46.6)
Hemoglobin: 12.4 g/dL (ref 11.1–15.9)
Immature Grans (Abs): 0 10*3/uL (ref 0.0–0.1)
Immature Granulocytes: 0 %
Lymphocytes Absolute: 4.3 10*3/uL — ABNORMAL HIGH (ref 0.7–3.1)
Lymphs: 54 %
MCH: 29.9 pg (ref 26.6–33.0)
MCHC: 31.8 g/dL (ref 31.5–35.7)
MCV: 94 fL (ref 79–97)
Monocytes Absolute: 0.9 10*3/uL (ref 0.1–0.9)
Monocytes: 11 %
Neutrophils Absolute: 2.6 10*3/uL (ref 1.4–7.0)
Neutrophils: 33 %
Platelets: 319 10*3/uL (ref 150–450)
RBC: 4.15 x10E6/uL (ref 3.77–5.28)
RDW: 11.5 % — ABNORMAL LOW (ref 11.7–15.4)
WBC: 7.9 10*3/uL (ref 3.4–10.8)

## 2023-09-17 LAB — COMPREHENSIVE METABOLIC PANEL
ALT: 15 [IU]/L (ref 0–32)
AST: 17 [IU]/L (ref 0–40)
Albumin: 4.2 g/dL (ref 3.9–4.9)
Alkaline Phosphatase: 73 [IU]/L (ref 44–121)
BUN/Creatinine Ratio: 16 (ref 12–28)
BUN: 16 mg/dL (ref 8–27)
Bilirubin Total: 0.4 mg/dL (ref 0.0–1.2)
CO2: 24 mmol/L (ref 20–29)
Calcium: 10.2 mg/dL (ref 8.7–10.3)
Chloride: 105 mmol/L (ref 96–106)
Creatinine, Ser: 1.02 mg/dL — ABNORMAL HIGH (ref 0.57–1.00)
Globulin, Total: 2.7 g/dL (ref 1.5–4.5)
Glucose: 80 mg/dL (ref 70–99)
Potassium: 4 mmol/L (ref 3.5–5.2)
Sodium: 143 mmol/L (ref 134–144)
Total Protein: 6.9 g/dL (ref 6.0–8.5)
eGFR: 61 mL/min/{1.73_m2} (ref >59)

## 2023-09-17 LAB — THYROID PANEL WITH TSH
Free Thyroxine Index: 2.2 (ref 1.2–4.9)
T3 Uptake Ratio: 28 % (ref 24–39)
T4, Total: 7.7 ug/dL (ref 4.5–12.0)
TSH: 1.69 u[IU]/mL (ref 0.450–4.500)

## 2023-09-20 ENCOUNTER — Telehealth: Payer: Self-pay

## 2023-09-20 NOTE — Telephone Encounter (Signed)
-----   Message from Georgian Co sent at 09/20/2023  8:06 AM EST ----- Your labs look great overall. Please drink 64 to 80 ounces of water daily to improve your hydration and kidney function.  Liver function, electrolytes, blood count, are normal.  Thyroid levels are therapeutic-continue same dose thyroid medication.  Thanks, Georgian Co, PA-C

## 2023-09-20 NOTE — Telephone Encounter (Signed)
Pt was called and is aware of results, DOB was confirmed.  ?

## 2023-09-21 ENCOUNTER — Ambulatory Visit (INDEPENDENT_AMBULATORY_CARE_PROVIDER_SITE_OTHER): Payer: Medicare Other | Admitting: Clinical

## 2023-09-21 DIAGNOSIS — F341 Dysthymic disorder: Secondary | ICD-10-CM

## 2023-09-21 NOTE — Progress Notes (Signed)
   THERAPIST PROGRESS NOTE  Session Time: 40 minutes  Participation Level: Active  Behavioral Response: CasualAlertDepressed  Type of Therapy: Individual Therapy  Treatment Goals addressed: [name] WILL PARTICIPATE IN AT LEAST 80% OF SCHEDULED INDIVIDUAL PSYCHOTHERAPY SESSIONS   ProgressTowards Goals: Progressing  Interventions: CBT  Summary:  Robin [name] is a 67 y.o. female who presents for the scheduled appointment oriented x 5 appropriately dressed and friendly.  Client denied hallucinations and delusions. Client reported on today she continues to be depressed. Client reported things are not changing at home with her husband. Client reported she believes he is tampering with the food she drinks and eats. Client reported everything taste weird and she smells gasoline. Client reported she wishes she could feel better if he would stop tampering with her things. Client reported she does not want to move but also wants the process to speed up so she can see about them living separately. Client reported she doe snot have anyone to talk to about what she goes through. Client reported she feels like he can drive her to the point of complete frustration and depression. Client reported she and her husband will be married 45 years in a few weeks.  Evidence of progress towards goal:  client reported her depression persists 7 days per week.  Suicidal/Homicidal: Nowithout intent/plan  Therapist Response:  Therapist began the appointment asking the client how she has been doing. Therapist used cbt to engage with active listening and positive emotional support. Therapist used cbt to engage and give the client time to discuss her thoughts and feelings. Therapist used cbt to discuss healthy coping skills. Therapist used CBT ask the client to identify her progress with frequency of use with coping skills with continued practice in her daily activity.    Therapist assigned the client homework to  practice self care.   Plan: Return again in 4 weeks.  Diagnosis: persistent depressive disorder  Collaboration of Care: Patient refused AEB none requested by the client.  Patient/Guardian was advised Release of Information must be obtained prior to any record release in order to collaborate their care with an outside provider. Patient/Guardian was advised if they have not already done so to contact the registration department to sign all necessary forms in order for [name] to release information regarding their care.   Consent: Patient/Guardian gives verbal consent for treatment and assignment of benefits for services provided during this visit. Patient/Guardian expressed understanding and agreed to proceed.   Neena Rhymes Kuron Docken, LCSW 09/21/2023

## 2023-09-22 ENCOUNTER — Ambulatory Visit (INDEPENDENT_AMBULATORY_CARE_PROVIDER_SITE_OTHER): Payer: Medicare Other | Admitting: Bariatrics

## 2023-09-22 ENCOUNTER — Encounter: Payer: Self-pay | Admitting: Bariatrics

## 2023-09-22 ENCOUNTER — Telehealth: Payer: Self-pay | Admitting: *Deleted

## 2023-09-22 VITALS — BP 115/72 | HR 58 | Temp 97.8°F | Ht 68.0 in | Wt 250.0 lb

## 2023-09-22 DIAGNOSIS — E66812 Obesity, class 2: Secondary | ICD-10-CM | POA: Diagnosis not present

## 2023-09-22 DIAGNOSIS — E1169 Type 2 diabetes mellitus with other specified complication: Secondary | ICD-10-CM

## 2023-09-22 DIAGNOSIS — Z7985 Long-term (current) use of injectable non-insulin antidiabetic drugs: Secondary | ICD-10-CM

## 2023-09-22 DIAGNOSIS — E669 Obesity, unspecified: Secondary | ICD-10-CM

## 2023-09-22 DIAGNOSIS — E785 Hyperlipidemia, unspecified: Secondary | ICD-10-CM

## 2023-09-22 DIAGNOSIS — E7849 Other hyperlipidemia: Secondary | ICD-10-CM

## 2023-09-22 DIAGNOSIS — Z6838 Body mass index (BMI) 38.0-38.9, adult: Secondary | ICD-10-CM

## 2023-09-22 MED ORDER — TRULICITY 4.5 MG/0.5ML ~~LOC~~ SOAJ: 4.5000 mg | SUBCUTANEOUS | 0 refills | Status: AC

## 2023-09-22 NOTE — Progress Notes (Signed)
WEIGHT SUMMARY AND BIOMETRICS  Weight Lost Since Last Visit: 2lb  Weight Gained Since Last Visit: 0   Vitals Temp: 97.8 F (36.6 C) BP: 115/72 Pulse Rate: (!) 58 SpO2: 99 %   Anthropometric Measurements Height: 5\' 8"  (1.727 m) Weight: 250 lb (113.4 kg) BMI (Calculated): 38.02 Weight at Last Visit: 252lb Weight Lost Since Last Visit: 2lb Weight Gained Since Last Visit: 0 Starting Weight: 310lb Total Weight Loss (lbs): 60 lb (27.2 kg)   Body Composition  Body Fat %: 50.1 % Fat Mass (lbs): 125.6 lbs Muscle Mass (lbs): 118.8 lbs Total Body Water (lbs): 83.6 lbs Visceral Fat Rating : 16   Other Clinical Data Fasting: no Labs: no Today's Visit #: 27 Starting Date: 06/03/21    OBESITY Robin [name] is here to discuss her progress with her obesity treatment plan along with follow-up of her obesity related diagnoses.    Nutrition Plan: the Category 3 plan - 50% adherence.  Current exercise:  Chair exercises  Interim History:  She is down 2 lbs since her last visit.  Not eating all of the food on the plan., Protein intake is as prescribed, Is not skipping meals, Not journaling consistently., and Water intake is adequate.   Pharmacotherapy: Robin [name] is on Trulicity 4.5 mg SQ weekly Adverse side effects: None Hunger is moderately controlled.  Cravings are moderately controlled.  Assessment/Plan:   Type II Diabetes HgbA1c is at goal. Last A1c was 5.0 Episodes of hypoglycemia: no Medication(s): Trulicity 4.5 mg SQ weekly  Lab Results  Component Value Date   HGBA1C 5.0 07/15/2023   HGBA1C 5.4 03/15/2023   HGBA1C 5.3 10/06/2022   Lab Results  Component Value Date   LDLCALC 68 10/06/2022   CREATININE 1.02 (H) 09/16/2023   Lab Results  Component Value Date   GFR 87.84 03/08/2018    Plan: Continue and refill Trulicity 4.5 mg SQ weekly Will keep  all carbohydrates low both sweets and starches.  Will continue exercise regimen to 30 to 60 minutes on most days of the week.  Aim for 7 to 9 hours of sleep nightly.  Eat more low glycemic index foods.   Hyperlipidemia LDL is not at goal. Medication(s): Trulicity  Cardiovascular risk factors: advanced age (older than 30 for men, 47 for women), diabetes mellitus, dyslipidemia, hypertension, and obesity (BMI >= 30 kg/m2)  Lab Results  Component Value Date   CHOL 140 10/06/2022   HDL 58 10/06/2022   LDLCALC 68 10/06/2022   TRIG 71 10/06/2022   CHOLHDL 2.2 07/10/2022   Lab Results  Component Value Date   ALT 15 09/16/2023   AST 17 09/16/2023   ALKPHOS 73 09/16/2023   BILITOT 0.4 09/16/2023   The 10-year ASCVD risk score (Arnett DK, et al., 2019) is: 11.7%   Values used to calculate the score:     Age: 67 years  Sex: Female     Is Non-Hispanic African American: Yes     Diabetic: Yes     Tobacco smoker: No     Systolic Blood Pressure: 115 mmHg     Is BP treated: Yes     HDL Cholesterol: 58 mg/dL     Total Cholesterol: 140 mg/dL  Plan:  Continue statin.  Information sheet on healthy vs unhealthy fats.  Will avoid all trans fats.  Will read labels Will minimize saturated fats except the following: low fat meats in moderation, diary, and limited dark chocolate.  Increase Omega 3 in foods, and consider an Omega 3 supplement.       Morbid Obesity: Current BMI BMI (Calculated): 38.02   Pharmacotherapy Plan Continue and refill  Trulicity 4.5 mg SQ weekly  Robin [name] is currently in the action stage of change. As such, her goal is to continue with weight loss efforts.  She has agreed to the Category 3 plan.  Exercise goals: Older adults should follow the adult guidelines. When older adults cannot meet the adult guidelines, they should be as physically active as their abilities and conditions will allow.  She is wheelchair bound and will do chair exercises.   Behavioral  modification strategies: increasing lean protein intake, decreasing simple carbohydrates , meal planning , planning for success, increasing vegetables, decrease junk food, and avoiding temptations.  Robin [name] has agreed to follow-up with our clinic in 4 weeks.     Objective:   VITALS: Per patient if applicable, see vitals. GENERAL: Alert and in no acute distress. CARDIOPULMONARY: No increased WOB. Speaking in clear sentences.  PSYCH: Pleasant and cooperative. Speech normal rate and rhythm. Affect is appropriate. Insight and judgement are appropriate. Attention is focused, linear, and appropriate.  NEURO: Oriented as arrived to appointment on time with no prompting.   Attestation Statements:    This was prepared with the assistance of Engineer, civil (consulting).  Occasional wrong-word or sound-a-like substitutions may have occurred due to the inherent limitations of voice recognition   Corinna Capra, DO

## 2023-09-22 NOTE — Telephone Encounter (Signed)
Copied from CRM [id number]. Topic: General - Other >> Sep 22, 2023  1:14 PM Phill Myron wrote: Please call pt. Robin [name]. She stated that Dr Laural Benes was to have received some documents. (3 wks ago)  Patient did not have name of company and really did not give a clear understanding as to what the documents were pertaining to. PLease advise. I also told her to call back if she remembers the name or find the information on the company.

## 2023-09-23 NOTE — Telephone Encounter (Signed)
Called & spoke to the patient. Verified name & DOB. Informed patient that a form from [employer] was received and signed by Dr.Johnson. This form is to enroll in the  [employer] [facility] Chronic Special needs Plan.  Form was successfully faxed back on 09/23/2023. Patient expressed verbal understanding of all discussed. No further questions at this time.

## 2023-10-04 ENCOUNTER — Ambulatory Visit (INDEPENDENT_AMBULATORY_CARE_PROVIDER_SITE_OTHER): Payer: Medicare Other | Admitting: Clinical

## 2023-10-04 DIAGNOSIS — F341 Dysthymic disorder: Secondary | ICD-10-CM | POA: Diagnosis not present

## 2023-10-04 NOTE — Progress Notes (Signed)
   THERAPIST PROGRESS NOTE  Session Time: 60 minutes  Participation Level: Active  Behavioral Response: CasualAlertDepressed  Type of Therapy: Individual Therapy  Treatment Goals addressed:  Robin [name] WILL PARTICIPATE IN AT LEAST 80% OF SCHEDULED INDIVIDUAL PSYCHOTHERAPY SESSIONS   ProgressTowards Goals: Met  Interventions: CBT  Summary:  Robin [name] is a 67 y.o. female who presents for the scheduled appointment oriented times five, appropriately dressed and friendly. Client denied hallucinations and delusions. Client reported she has a mix of emotions.  Client reported they have had more people to be at the house and they have multiple offers on their home.  Client reported her husband picked up by her.  Client reported with that he decided to get a moving truck to pack them up for them to move by this weekend.  Client reported everything has been still quick and she is upset about that with her" being the least of his concerns.  Client reported she has a lot of phone calls to make an updating her children this week about the move.  Client reported their 45th anniversary will be tomorrow and all she can think about is all of the hurt that she has gone through. Evidence of progress towards goal: Client reported 1 positive of continuing to look for outpatient services for therapy when she is moved.   Suicidal/Homicidal: Nowithout intent/plan  Therapist Response:  Therapist began the appointment asking the client how she has been doing. Therapist used cbt to engage with active listening and positive emotional support. Therapist used cbt to engage and ask the client about changes that are occurring. Therapist used cbt to engage and empathize as well as discuss support options for when she moves away. Therapist used CBT ask the client to identify her progress with frequency of use with coping skills with continued practice in her daily activity.     Plan: client will not return for  therapy and/or psychiatry services due to [place] out of state. Client reported she will call the office if she needs refills to cover her until she finds a new provider.  Diagnosis: persistent depressive disorder  Collaboration of Care: Patient refused AEB none.  Patient/Guardian was advised Release of Information must be obtained prior to any record release in order to collaborate their care with an outside provider. Patient/Guardian was advised if they have not already done so to contact the registration department to sign all necessary forms in order for [name] to release information regarding their care.   Consent: Patient/Guardian gives verbal consent for treatment and assignment of benefits for services provided during this visit. Patient/Guardian expressed understanding and agreed to proceed.   Neena Rhymes Kathlynn Swofford, LCSW 10/04/2023

## 2023-10-11 ENCOUNTER — Other Ambulatory Visit: Payer: Self-pay | Admitting: Internal Medicine

## 2023-10-12 ENCOUNTER — Telehealth (HOSPITAL_COMMUNITY): Payer: Self-pay | Admitting: Clinical

## 2023-10-14 ENCOUNTER — Other Ambulatory Visit: Payer: Self-pay | Admitting: Bariatrics

## 2023-10-14 DIAGNOSIS — E1169 Type 2 diabetes mellitus with other specified complication: Secondary | ICD-10-CM

## 2023-10-18 ENCOUNTER — Ambulatory Visit (HOSPITAL_COMMUNITY): Payer: Self-pay | Admitting: Clinical

## 2023-10-21 ENCOUNTER — Ambulatory Visit: Payer: Medicaid Other | Admitting: Bariatrics
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
## 2023-10-22 ENCOUNTER — Telehealth: Payer: Self-pay

## 2023-10-22 ENCOUNTER — Other Ambulatory Visit: Payer: Self-pay | Admitting: Internal Medicine

## 2023-10-22 DIAGNOSIS — I152 Hypertension secondary to endocrine disorders: Secondary | ICD-10-CM

## 2023-10-22 DIAGNOSIS — E1142 Type 2 diabetes mellitus with diabetic polyneuropathy: Secondary | ICD-10-CM

## 2023-10-22 NOTE — Telephone Encounter (Signed)
Call placed to patient to schedule W2M and patient states that she has moved to [place].

## 2023-10-25 NOTE — Telephone Encounter (Signed)
Requested Prescriptions  Pending Prescriptions Disp Refills   hydrALAZINE (APRESOLINE) 50 MG tablet [Pharmacy Med Name: HYDRALAZINE 50MG  TABS 50 Tablet] 60 tablet 6    Sig: TAKE 1 TABLET BY MOUTH EVERY MORNING AND EVERY EVENING     Cardiovascular:  Vasodilators Failed - 10/25/2023  8:21 AM      Failed - ANA Screen, Ifa, Serum in normal range and within 360 days    Anti Nuclear Antibody(ANA)  Date Value Ref Range Status  05/12/2016 NEG NEGATIVE Final         Passed - HCT in normal range and within 360 days    HCT  Date Value Ref Range Status  06/25/2011 41 % Final   Hematocrit  Date Value Ref Range Status  09/16/2023 39.0 34.0 - 46.6 % Final         Passed - HGB in normal range and within 360 days    Hemoglobin  Date Value Ref Range Status  09/16/2023 12.4 11.1 - 15.9 g/dL Final         Passed - RBC in normal range and within 360 days    RBC  Date Value Ref Range Status  09/16/2023 4.15 3.77 - 5.28 x10E6/uL Final  01/04/2021 4.34 3.87 - 5.11 MIL/uL Final         Passed - WBC in normal range and within 360 days    WBC  Date Value Ref Range Status  09/16/2023 7.9 3.4 - 10.8 x10E3/uL Final  01/04/2021 12.0 (H) 4.0 - 10.5 K/uL Final         Passed - PLT in normal range and within 360 days    Platelets  Date Value Ref Range Status  09/16/2023 319 150 - 450 x10E3/uL Final         Passed - Last BP in normal range    BP Readings from Last 1 Encounters:  09/22/23 115/72         Passed - Valid encounter within last 12 months    Recent Outpatient Visits           1 month ago Sinus headache   [place] [facility] [place] - [facility] Dept Of [place]. [facility] [place], [place], [place]   3 months ago Type 2 diabetes mellitus with morbid obesity [facility])   [place] [facility] - A Dept Of [place]. [facility] Jonah Blue B, MD   7 months ago Type 2 diabetes mellitus with morbid obesity [facility])   [place] [facility] - A Dept Of  [place]. [facility] Blue B, MD   11 months ago Type 2 diabetes mellitus with morbid obesity [facility])   [place] [facility] - A Dept Of [place]. [facility] Jonah Blue B, MD   1 year ago Type 2 diabetes mellitus with morbid obesity [facility])   [place] [facility] - A Dept Of . [facility] Marcine Matar, MD       Future Appointments             In 3 weeks Marcine Matar, MD [facility] [place] - A Dept Of Eligha Bridegroom. [facility]   In 1 month Loleta Chance, MD [facility] Urogynecology at [facility], [facility]   In 1 month Laural Benes Binnie Rail, MD [facility] [place] - A Dept Of Eligha Bridegroom. [facility]  hydrochlorothiazide (HYDRODIURIL) 25 MG tablet [Pharmacy Med Name: HYDROCHLOROTHIAZ 25MG  TAB 25 Tablet] 30 tablet 6    Sig: TAKE 1 TABLET BY MOUTH EVERY MORNING     Cardiovascular: Diuretics - Thiazide Failed - 10/25/2023  8:21 AM      Failed - Cr in normal range and within 180 days    Creat  Date Value Ref Range Status  09/22/2016 0.80 0.50 - 1.05 mg/dL Final    Comment:      For patients > or = 66 years of age: The upper reference limit for Creatinine is approximately 13% higher for people identified as African-American.      Creatinine, Ser  Date Value Ref Range Status  09/16/2023 1.02 (H) 0.57 - 1.00 mg/dL Final         Passed - K in normal range and within 180 days    Potassium  Date Value Ref Range Status  09/16/2023 4.0 3.5 - 5.2 mmol/L Final  06/25/2011 4.2 mmol/L Final         Passed - Na in normal range and within 180 days    Sodium  Date Value Ref Range Status  09/16/2023 143 134 - 144 mmol/L Final         Passed - Last BP in normal range    BP Readings from Last 1 Encounters:  09/22/23 115/72         Passed - Valid encounter within last 6 months    Recent Outpatient Visits           1 month ago Sinus headache    [place] [facility] [place] - A Dept Of [place]. [facility] [place], [place], [place]   3 months ago Type 2 diabetes mellitus with morbid obesity [facility])   [place] [facility] - A Dept Of [place]. [facility] Jonah Blue B, MD   7 months ago Type 2 diabetes mellitus with morbid obesity [facility])   [place] [facility] - A Dept Of [place]. [facility] Jonah Blue B, MD   11 months ago Type 2 diabetes mellitus with morbid obesity [facility])   [place] [facility] - A Dept Of [place]. [facility] Jonah Blue B, MD   1 year ago Type 2 diabetes mellitus with morbid obesity [facility])   [place] [facility] - A Dept Of . [facility] Marcine Matar, MD       Future Appointments             In 3 weeks Marcine Matar, MD [facility] [place] - A Dept Of Eligha Bridegroom. [facility]   In 1 month Loleta Chance, MD [facility] Urogynecology at [facility], [facility]   In 1 month Laural Benes Binnie Rail, MD [facility] [place] - A Dept Of Eligha Bridegroom. [facility]             gabapentin (NEURONTIN) 600 MG tablet [Pharmacy Med Name: GABAPENTIN 600 MG TABS 600 Tablet] 90 tablet 6    Sig: TAKE 1 TABLET BY MOUTH THREE TIMES DAILY     Neurology: Anticonvulsants - gabapentin Failed - 10/25/2023  8:21 AM      Failed - Cr in normal range and within 360 days    Creat  Date Value Ref Range Status  09/22/2016 0.80 0.50 - 1.05 mg/dL Final    Comment:  For patients > or = 66 years of age: The upper reference limit for Creatinine is approximately 13% higher for people identified as African-American.      Creatinine, Ser  Date Value Ref Range Status  09/16/2023 1.02 (H) 0.57 - 1.00 mg/dL Final         Passed - Completed PHQ-2 or PHQ-9 in the last 360 days      Passed - Valid encounter within last 12 months    Recent Outpatient Visits           1  month ago Sinus headache   [place] [facility] [place] - A Dept Of [place]. [facility] [place], [place], [place]   3 months ago Type 2 diabetes mellitus with morbid obesity [facility])   [place] [facility] - A Dept Of [place]. [facility] Jonah Blue B, MD   7 months ago Type 2 diabetes mellitus with morbid obesity [facility])   [place] [facility] - A Dept Of [place]. [facility] Jonah Blue B, MD   11 months ago Type 2 diabetes mellitus with morbid obesity [facility])   [place] [facility] - A Dept Of [place]. [facility] Jonah Blue B, MD   1 year ago Type 2 diabetes mellitus with morbid obesity [facility])   [place] [facility] - A Dept Of [place]. [facility] Marcine Matar, MD       Future Appointments             In 3 weeks Marcine Matar, MD [facility] [place] - A Dept Of Eligha Bridegroom. [facility]   In 1 month Loleta Chance, MD [facility] Health Urogynecology at [facility], [facility]   In 1 month Laural Benes Binnie Rail, MD [facility] [place] - A Dept Of Eligha Bridegroom. [facility]             celecoxib (CELEBREX) 100 MG capsule [Pharmacy Med Name: CELECOXIB 100 MG CAPS 100 Capsule] 60 capsule 6    Sig: TAKE ONE (1) CAPSULE BY MOUTH TWICE DAILY     Analgesics:  COX2 Inhibitors Failed - 10/25/2023  8:21 AM      Failed - Manual Review: Labs are only required if the patient has taken medication for more than 8 weeks.      Failed - Cr in normal range and within 360 days    Creat  Date Value Ref Range Status  09/22/2016 0.80 0.50 - 1.05 mg/dL Final    Comment:      For patients > or = 66 years of age: The upper reference limit for Creatinine is approximately 13% higher for people identified as African-American.      Creatinine, Ser  Date Value Ref Range Status  09/16/2023 1.02 (H) 0.57 - 1.00 mg/dL Final         Passed - HGB in normal  range and within 360 days    Hemoglobin  Date Value Ref Range Status  09/16/2023 12.4 11.1 - 15.9 g/dL Final         Passed - HCT in normal range and within 360 days    HCT  Date Value Ref Range Status  06/25/2011 41 % Final   Hematocrit  Date Value Ref Range Status  09/16/2023 39.0 34.0 - 46.6 % Final         Passed - AST in normal  range and within 360 days    AST  Date Value Ref Range Status  09/16/2023 17 0 - 40 IU/L Final  06/25/2011 16 U/L Final         Passed - ALT in normal range and within 360 days    ALT  Date Value Ref Range Status  09/16/2023 15 0 - 32 IU/L Final         Passed - eGFR is 30 or above and within 360 days    GFR, Est African American  Date Value Ref Range Status  09/22/2016 >89 >=60 mL/min Final   GFR calc Af Amer  Date Value Ref Range Status  12/26/2019 >60 >60 mL/min Final   GFR, Est Non African American  Date Value Ref Range Status  09/22/2016 82 >=60 mL/min Final   GFR, Estimated  Date Value Ref Range Status  01/04/2021 >60 >60 mL/min Final    Comment:    (NOTE) Calculated using the CKD-EPI Creatinine Equation (2021)    GFR  Date Value Ref Range Status  03/08/2018 87.84 >60.00 mL/min Final   eGFR  Date Value Ref Range Status  09/16/2023 61 >59 mL/min/1.73 Final         Passed - Patient is not pregnant      Passed - Valid encounter within last 12 months    Recent Outpatient Visits           1 month ago Sinus headache   [place] [facility] [place] - [facility] Dept Of [place]. [facility] [place], [place], [place]   3 months ago Type 2 diabetes mellitus with morbid obesity [facility])   [place] [facility] - A Dept Of [place]. [facility] Jonah Blue B, MD   7 months ago Type 2 diabetes mellitus with morbid obesity [facility])   [place] [facility] - A Dept Of [place]. [facility] Jonah Blue B, MD   11 months ago Type 2 diabetes mellitus with morbid obesity [facility])   [facility] - A Dept Of [place]. [facility] Jonah Blue B, MD   1 year ago Type 2 diabetes mellitus with morbid obesity [facility])   [place] [facility] - A Dept Of [place]. [facility] Marcine Matar, MD       Future Appointments             In 3 weeks Marcine Matar, MD [facility] [place] - A Dept Of Eligha Bridegroom. [facility]   In 1 month Loleta Chance, MD [facility]rogynecology at [facility], [facility]   In 1 month Laural Benes Binnie Rail, MD [facility] [place] - A Dept Of Eligha Bridegroom. [facility]

## 2023-10-26 ENCOUNTER — Encounter (HOSPITAL_COMMUNITY): Payer: Self-pay | Admitting: Psychiatry

## 2023-10-26 ENCOUNTER — Telehealth: Payer: Self-pay | Admitting: Gastroenterology

## 2023-10-26 ENCOUNTER — Telehealth (HOSPITAL_COMMUNITY): Payer: Self-pay | Admitting: Psychiatry

## 2023-10-26 DIAGNOSIS — F411 Generalized anxiety disorder: Secondary | ICD-10-CM

## 2023-10-26 DIAGNOSIS — F341 Dysthymic disorder: Secondary | ICD-10-CM

## 2023-10-26 MED ORDER — LUBIPROSTONE 8 MCG PO CAPS: 8.0000 ug | ORAL_CAPSULE | Freq: Two times a day (BID) | ORAL | 3 refills | Status: AC

## 2023-10-26 MED ORDER — LUBIPROSTONE 8 MCG PO CAPS: 8.0000 ug | ORAL_CAPSULE | Freq: Two times a day (BID) | ORAL | 3 refills | Status: DC

## 2023-10-26 MED ORDER — DULOXETINE HCL 60 MG PO CPEP: 60.0000 mg | ORAL_CAPSULE | Freq: Two times a day (BID) | ORAL | 1 refills | Status: AC

## 2023-10-26 MED ORDER — BUPROPION HCL ER (XL) 300 MG PO TB24: 300.0000 mg | ORAL_TABLET | Freq: Every day | ORAL | 1 refills | Status: AC

## 2023-10-26 NOTE — Telephone Encounter (Signed)
Patient called and stated that she was prescribed Linzess but when she went to go pick up at the pharmacy they where trying to charge her 47 dollars for the Linzess. Patient is wanting to know if we had done a prior authorization for that medication. Patient is requesting a call back . Please advise.

## 2023-10-26 NOTE — Telephone Encounter (Signed)
Refill of psychotropic medications sent to pharmacy on file to bridge until next appointment with this provider.  Daine Gip, MD 10/26/23

## 2023-10-26 NOTE — Telephone Encounter (Signed)
Contacted patient and patient stated that she could not afford the Linzess and was requesting an alternative. Please advise.

## 2023-10-26 NOTE — Telephone Encounter (Signed)
Amitiza was sent to patient's pharmacy. Patient was notified.

## 2023-10-26 NOTE — Addendum Note (Signed)
Addended by: Rise Paganini on: 10/26/2023 04:53 PM   Modules accepted: Orders

## 2023-11-01 ENCOUNTER — Ambulatory Visit (HOSPITAL_COMMUNITY): Payer: Self-pay | Admitting: Clinical

## 2023-11-15 ENCOUNTER — Encounter: Payer: Self-pay | Admitting: Adult Health

## 2023-11-15 ENCOUNTER — Institutional Professional Consult (permissible substitution): Payer: Medicare Other | Admitting: Adult Health

## 2023-11-17 ENCOUNTER — Ambulatory Visit: Payer: Medicare Other | Admitting: Obstetrics

## 2023-11-18 ENCOUNTER — Ambulatory Visit: Payer: Medicare Other | Admitting: Internal Medicine

## 2023-11-22 ENCOUNTER — Other Ambulatory Visit: Payer: Self-pay | Admitting: Internal Medicine

## 2023-11-22 DIAGNOSIS — E1159 Type 2 diabetes mellitus with other circulatory complications: Secondary | ICD-10-CM

## 2023-11-29 ENCOUNTER — Ambulatory Visit: Payer: Medicare Other | Admitting: Obstetrics

## 2023-11-30 ENCOUNTER — Ambulatory Visit (INDEPENDENT_AMBULATORY_CARE_PROVIDER_SITE_OTHER): Payer: MEDICAID | Admitting: Podiatry

## 2023-11-30 DIAGNOSIS — Z91199 Patient's noncompliance with other medical treatment and regimen due to unspecified reason: Secondary | ICD-10-CM

## 2023-11-30 NOTE — Progress Notes (Signed)
 1. No-show for appointment
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
## 2023-12-07 ENCOUNTER — Encounter (HOSPITAL_COMMUNITY): Payer: Self-pay | Admitting: Psychiatry

## 2023-12-20 ENCOUNTER — Telehealth: Payer: Self-pay | Admitting: Pharmacist

## 2023-12-20 NOTE — Telephone Encounter (Signed)
 Received notification from patient's [employer] plan that she is in danger of failing adherence measures. Call placed to patient. She recently [place] out of [place] and is planning to find a new PCP. I advised her to contact her insurance and inform them of the PCP change.

## 2023-12-21 ENCOUNTER — Ambulatory Visit: Payer: Medicare Other | Admitting: Internal Medicine

## 2023-12-29 DIAGNOSIS — M170 Bilateral primary osteoarthritis of knee: Secondary | ICD-10-CM | POA: Diagnosis not present

## 2024-01-10 ENCOUNTER — Other Ambulatory Visit: Payer: Self-pay | Admitting: Internal Medicine

## 2024-01-10 DIAGNOSIS — R519 Headache, unspecified: Secondary | ICD-10-CM

## 2024-01-10 NOTE — Telephone Encounter (Unsigned)
 Copied from CRM [id number]. Topic: Clinical - Medication Refill >> Jan 10, 2024  1:57 PM [place] E wrote: Most Recent Primary Care Visit:  Provider: Anders Simmonds  Department: [facility]  Visit Type: OFFICE VISIT  Date: 09/16/2023  Medication: fluticasone (FLONASE) 50 MCG/ACT nasal spray  Has the patient contacted their pharmacy? Yes, the pharmacy called in requesting the refill. (Agent: If no, request that the patient contact the pharmacy for the refill. If patient does not wish to contact the pharmacy document the reason why and proceed with request.) (Agent: If yes, when and what did the pharmacy advise?)  Is this the correct pharmacy for this prescription? Yes If no, delete pharmacy and type the correct one.  This is the patient's preferred pharmacy:   [facility], [place] - [address] [place] [address] Phone: [phone] Fax: [phone]   Has the prescription been filled recently? No  Is the patient out of the medication? Yes  Has the patient been seen for an appointment in the last year OR does the patient have an upcoming appointment? Yes  Can we respond through MyChart? Yes  Agent: Please be advised that Rx refills may take up to 3 business days. We ask that you follow-up with your pharmacy.

## 2024-01-11 MED ORDER — FLUTICASONE PROPIONATE 50 MCG/ACT NA SUSP: 1.0000 | Freq: Every day | NASAL | 1 refills | Status: AC

## 2024-01-11 NOTE — Telephone Encounter (Signed)
 Requested Prescriptions  Pending Prescriptions Disp Refills   fluticasone (FLONASE) 50 MCG/ACT nasal spray 16 g 1    Sig: Place 1 spray into both nostrils daily.     Ear, Nose, and Throat: Nasal Preparations - Corticosteroids Passed - 01/11/2024  2:22 PM      Passed - Valid encounter within last 12 months    Recent Outpatient Visits           3 months ago Sinus headache   [facility] Dept Of [place]. [facility], Marylene Land M, [place]   6 months ago Type 2 diabetes mellitus with morbid obesity [facility])   [facility] Of [place]. [facility] Jonah Blue B, MD   10 months ago Type 2 diabetes mellitus with morbid obesity [facility])   [place] [facility] - [facility] Dept Of [place]. [facility] Jonah Blue B, MD   1 year ago Type 2 diabetes mellitus with morbid obesity [facility])   [place] [facility] - [facility] Dept Of [place]. [facility] Jonah Blue B, MD   1 year ago Type 2 diabetes mellitus with morbid obesity [facility])   [place] [facility] - [facility] Dept Of [place]. [facility] Marcine Matar, MD

## 2024-01-19 ENCOUNTER — Other Ambulatory Visit: Payer: Self-pay | Admitting: Internal Medicine

## 2024-01-19 ENCOUNTER — Other Ambulatory Visit: Payer: Self-pay | Admitting: Physician Assistant

## 2024-01-19 DIAGNOSIS — E1159 Type 2 diabetes mellitus with other circulatory complications: Secondary | ICD-10-CM

## 2024-01-19 DIAGNOSIS — E1169 Type 2 diabetes mellitus with other specified complication: Secondary | ICD-10-CM

## 2024-01-19 DIAGNOSIS — J309 Allergic rhinitis, unspecified: Secondary | ICD-10-CM

## 2024-01-20 NOTE — Telephone Encounter (Signed)
 Requested Prescriptions  Pending Prescriptions Disp Refills   losartan (COZAAR) 25 MG tablet [Pharmacy Med Name: LOSARTAN 25MG  TABLET 25 Tablet] 90 tablet 11    Sig: TAKE 1 TABLET (25 MG TOTAL) BY MOUTH EVERY EVENING.     Cardiovascular:  Angiotensin Receptor Blockers Failed - 01/20/2024  4:22 PM      Failed - Cr in normal range and within 180 days    Creat  Date Value Ref Range Status  09/22/2016 0.80 0.50 - 1.05 mg/dL Final    Comment:      For patients > or = 66 years of age: The upper reference limit for Creatinine is approximately 13% higher for people identified as African-American.      Creatinine, Ser  Date Value Ref Range Status  09/16/2023 1.02 (H) 0.57 - 1.00 mg/dL Final         Passed - K in normal range and within 180 days    Potassium  Date Value Ref Range Status  09/16/2023 4.0 3.5 - 5.2 mmol/L Final  06/25/2011 4.2 mmol/L Final         Passed - Patient is not pregnant      Passed - Last BP in normal range    BP Readings from Last 1 Encounters:  09/22/23 115/72         Passed - Valid encounter within last 6 months    Recent Outpatient Visits           4 months ago Sinus headache   [facility] - [facility] Dept Of [place]. [facility], Shelvy Dickens M, [place]   6 months ago Type 2 diabetes mellitus with morbid obesity [facility])   [facility] - A Dept Of [place]. [facility] Concetta Dee B, [place]   10 months ago Type 2 diabetes mellitus with morbid obesity [facility])   [place] [facility] - A Dept Of [place]. [facility] Dee B, [place]   1 year ago Type 2 diabetes mellitus with morbid obesity [facility])   [facility] - A Dept Of [place]. [facility] Concetta Dee B, MD   1 year ago Type 2 diabetes mellitus with morbid obesity [facility])    [facility] - A Dept Of . [facility] Lawrance Presume, MD

## 2024-01-26 ENCOUNTER — Other Ambulatory Visit: Payer: Self-pay | Admitting: Pharmacist

## 2024-01-26 NOTE — Progress Notes (Signed)
 Pharmacy Quality Measure Review  This patient is appearing on a report for adherence to cholesterol (statin), diabetes, and hypertension (ACEi/ARB) medications this calendar year.   Medication: losartan  25 mg  Last fill date: 12/21/2023 for 30 day supply  Medication: atorvastatin  40 mg  Last fill date: 12/21/2023 for 30 day supply  Medication: dulaglutide  4.5 mg, metformin  500 mg  Last fill date: 10/14/2023 for 28 day supply  I contacted her on 12/20/2023 to discuss adherence. She informed me at that time that she has moved out of state and is in the process of looking for a new provider. Encouraged her to inform her insurance of this change once she establishes with a new PCP.   Marene Shape, PharmD, Becky Bowels, CPP Clinical Pharmacist [facility] [phone]

## 2024-02-18 ENCOUNTER — Inpatient Hospital Stay: Admission: RE | Admit: 2024-02-18 | Payer: Medicare Other | Source: Ambulatory Visit

## 2024-04-19 ENCOUNTER — Other Ambulatory Visit: Payer: Self-pay | Admitting: Internal Medicine

## 2024-04-19 ENCOUNTER — Other Ambulatory Visit: Payer: Self-pay | Admitting: Physician Assistant

## 2024-04-19 DIAGNOSIS — E1159 Type 2 diabetes mellitus with other circulatory complications: Secondary | ICD-10-CM

## 2024-04-19 DIAGNOSIS — E1169 Type 2 diabetes mellitus with other specified complication: Secondary | ICD-10-CM

## 2024-04-19 DIAGNOSIS — E038 Other specified hypothyroidism: Secondary | ICD-10-CM

## 2024-04-19 DIAGNOSIS — J309 Allergic rhinitis, unspecified: Secondary | ICD-10-CM

## 2024-05-18 ENCOUNTER — Other Ambulatory Visit: Payer: Self-pay | Admitting: Internal Medicine

## 2024-05-18 DIAGNOSIS — E038 Other specified hypothyroidism: Secondary | ICD-10-CM

## 2024-05-18 DIAGNOSIS — E1159 Type 2 diabetes mellitus with other circulatory complications: Secondary | ICD-10-CM

## 2024-05-22 ENCOUNTER — Other Ambulatory Visit: Payer: Self-pay | Admitting: Internal Medicine

## 2024-05-22 DIAGNOSIS — R519 Headache, unspecified: Secondary | ICD-10-CM

## 2024-05-22 DIAGNOSIS — E1169 Type 2 diabetes mellitus with other specified complication: Secondary | ICD-10-CM

## 2024-05-22 DIAGNOSIS — I152 Hypertension secondary to endocrine disorders: Secondary | ICD-10-CM

## 2024-05-22 DIAGNOSIS — J309 Allergic rhinitis, unspecified: Secondary | ICD-10-CM

## 2024-06-19 ENCOUNTER — Other Ambulatory Visit: Payer: Self-pay | Admitting: Internal Medicine

## 2024-06-19 DIAGNOSIS — E038 Other specified hypothyroidism: Secondary | ICD-10-CM

## 2024-06-20 ENCOUNTER — Other Ambulatory Visit: Payer: Self-pay | Admitting: Internal Medicine

## 2024-06-20 DIAGNOSIS — E1159 Type 2 diabetes mellitus with other circulatory complications: Secondary | ICD-10-CM

## 2024-07-10 ENCOUNTER — Encounter: Payer: Self-pay | Admitting: Internal Medicine

## 2024-08-08 ENCOUNTER — Other Ambulatory Visit: Payer: Self-pay | Admitting: Internal Medicine

## 2024-08-08 DIAGNOSIS — E1169 Type 2 diabetes mellitus with other specified complication: Secondary | ICD-10-CM

## 2024-09-25 ENCOUNTER — Other Ambulatory Visit: Payer: Self-pay | Admitting: Internal Medicine

## 2024-09-25 DIAGNOSIS — E1169 Type 2 diabetes mellitus with other specified complication: Secondary | ICD-10-CM

## 2024-09-26 ENCOUNTER — Other Ambulatory Visit: Payer: Self-pay | Admitting: Internal Medicine

## 2024-09-26 DIAGNOSIS — E1142 Type 2 diabetes mellitus with diabetic polyneuropathy: Secondary | ICD-10-CM

## 2024-10-03 ENCOUNTER — Telehealth: Payer: Self-pay | Admitting: Internal Medicine

## 2024-10-03 NOTE — Telephone Encounter (Unsigned)
 Copied from CRM [id number]. Topic: Clinical - Medication Refill >> Oct 03, 2024  3:21 PM Nathanel C wrote: Medication: esomeprazole  (NEXIUM ) 40 MG capsule glucose blood (ACCU-CHEK GUIDE TEST) test strip 100 count  Has the patient contacted their pharmacy? Yes   This is the patient's preferred pharmacy:    [facility], [place] - [address] [place] [address] Phone: [phone] Fax: [phone]  Is this the correct pharmacy for this prescription? Yes If no, delete pharmacy and type the correct one.   Has the prescription been filled recently? Yes  Is the patient out of the medication? Yes  Has the patient been seen for an appointment in the last year OR does the patient have an upcoming appointment? Yes  Can we respond through MyChart? No  Agent: Please be advised that Rx refills may take up to 3 business days. We ask that you follow-up with your pharmacy.

## 2024-10-04 ENCOUNTER — Other Ambulatory Visit: Payer: Self-pay

## 2024-10-04 DIAGNOSIS — E1169 Type 2 diabetes mellitus with other specified complication: Secondary | ICD-10-CM

## 2024-10-04 NOTE — Telephone Encounter (Signed)
 Called & spoke to the patient. Verified name & DOB. Informed that it has been over a year since she was last seen and it was previously indicated that that she moved out of stated. Therefore, refills cannot be completed unless she has moved back to [place] and will need a OV to re-establish care and receive refills. Patient confirmed that she is still in [place] and has not moved back. Advised patient to contact local PCP or Gi provider for refills. Patient expressed verbal understanding of all discussed.

## 2024-10-04 NOTE — Telephone Encounter (Signed)
 Pt has not been seen in over 1 year. On 10/2023 she indicated that she had moved out of state to [place] . She needs to contact her PCP or GI provider there for refills. If she has moved back to [place], she will need an appt to get re-established.

## 2024-10-04 NOTE — Telephone Encounter (Signed)
 Routing to PCP for review.

## 2024-11-03 ENCOUNTER — Telehealth: Payer: Self-pay | Admitting: Internal Medicine

## 2024-11-03 NOTE — Telephone Encounter (Signed)
 Copied from CRM [id number]. Topic: Clinical - Medication Refill >> Nov 02, 2024  4:44 PM   Delon T wrote:  Medication: Blood Glucose Monitoring Suppl (ACCU-CHEK GUIDE) w/Device KIT  Has the patient contacted their pharmacy? Yes (Agent: If no, request that the patient contact the pharmacy for the refill. If patient does not wish to contact the pharmacy document the reason why and proceed with request.) (Agent: If yes, when and what did the pharmacy advise?)  This is the patient's preferred pharmacy:    [facility], [place] - [address] [place] [address] Phone: [phone] Fax: [phone]  Is this the correct pharmacy for this prescription? Yes If no, delete pharmacy and type the correct one.   Has the prescription been filled recently? Yes  Is the patient out of the medication? Yes  Has the patient been seen for an appointment in the last year OR does the patient have an upcoming appointment? Yes  Can we respond through MyChart? Yes  Agent: Please be advised that Rx refills may take up to 3 business days. We ask that you follow-up with your pharmacy.

## 2024-11-04 NOTE — Telephone Encounter (Signed)
 Please refer back to my previous telephone message dated 10/04/2024 and your call to her at that time.
